# Patient Record
Sex: Female | Born: 1970
Health system: Southern US, Community
[De-identification: ages and names within clinical notes are randomized; demographics above are authoritative.]

## PROBLEM LIST (undated history)

## (undated) DIAGNOSIS — K219 Gastro-esophageal reflux disease without esophagitis: Secondary | ICD-10-CM

## (undated) DIAGNOSIS — E669 Obesity, unspecified: Secondary | ICD-10-CM

## (undated) DIAGNOSIS — D649 Anemia, unspecified: Secondary | ICD-10-CM

## (undated) DIAGNOSIS — I10 Essential (primary) hypertension: Secondary | ICD-10-CM

## (undated) DIAGNOSIS — M109 Gout, unspecified: Secondary | ICD-10-CM

## (undated) DIAGNOSIS — I209 Angina pectoris, unspecified: Secondary | ICD-10-CM

## (undated) DIAGNOSIS — D509 Iron deficiency anemia, unspecified: Secondary | ICD-10-CM

## (undated) DIAGNOSIS — Z9889 Other specified postprocedural states: Secondary | ICD-10-CM

## (undated) DIAGNOSIS — E041 Nontoxic single thyroid nodule: Secondary | ICD-10-CM

## (undated) DIAGNOSIS — I499 Cardiac arrhythmia, unspecified: Secondary | ICD-10-CM

## (undated) DIAGNOSIS — F419 Anxiety disorder, unspecified: Secondary | ICD-10-CM

## (undated) DIAGNOSIS — T884XXA Failed or difficult intubation, initial encounter: Secondary | ICD-10-CM

## (undated) DIAGNOSIS — G709 Myoneural disorder, unspecified: Secondary | ICD-10-CM

## (undated) DIAGNOSIS — K429 Umbilical hernia without obstruction or gangrene: Secondary | ICD-10-CM

## (undated) DIAGNOSIS — M545 Low back pain: Secondary | ICD-10-CM

## (undated) DIAGNOSIS — F1920 Other psychoactive substance dependence, uncomplicated: Secondary | ICD-10-CM

## (undated) DIAGNOSIS — Z8639 Personal history of other endocrine, nutritional and metabolic disease: Secondary | ICD-10-CM

## (undated) DIAGNOSIS — K2970 Gastritis, unspecified, without bleeding: Secondary | ICD-10-CM

## (undated) DIAGNOSIS — J45909 Unspecified asthma, uncomplicated: Secondary | ICD-10-CM

## (undated) DIAGNOSIS — E282 Polycystic ovarian syndrome: Secondary | ICD-10-CM

## (undated) DIAGNOSIS — M25569 Pain in unspecified knee: Secondary | ICD-10-CM

## (undated) DIAGNOSIS — Z8679 Personal history of other diseases of the circulatory system: Secondary | ICD-10-CM

## (undated) DIAGNOSIS — G4730 Sleep apnea, unspecified: Secondary | ICD-10-CM

## (undated) DIAGNOSIS — M1990 Unspecified osteoarthritis, unspecified site: Secondary | ICD-10-CM

## (undated) DIAGNOSIS — Z8719 Personal history of other diseases of the digestive system: Secondary | ICD-10-CM

## (undated) DIAGNOSIS — L309 Dermatitis, unspecified: Secondary | ICD-10-CM

## (undated) DIAGNOSIS — R51 Headache: Secondary | ICD-10-CM

## (undated) DIAGNOSIS — M25559 Pain in unspecified hip: Secondary | ICD-10-CM

## (undated) DIAGNOSIS — R519 Headache, unspecified: Secondary | ICD-10-CM

## (undated) DIAGNOSIS — M797 Fibromyalgia: Secondary | ICD-10-CM

## (undated) DIAGNOSIS — B009 Herpesviral infection, unspecified: Secondary | ICD-10-CM

## (undated) DIAGNOSIS — E119 Type 2 diabetes mellitus without complications: Secondary | ICD-10-CM

## (undated) DIAGNOSIS — M170 Bilateral primary osteoarthritis of knee: Secondary | ICD-10-CM

## (undated) DIAGNOSIS — F32A Depression, unspecified: Secondary | ICD-10-CM

## (undated) DIAGNOSIS — E039 Hypothyroidism, unspecified: Secondary | ICD-10-CM

## (undated) DIAGNOSIS — F329 Major depressive disorder, single episode, unspecified: Secondary | ICD-10-CM

## (undated) DIAGNOSIS — Z9660 Presence of unspecified orthopedic joint implant: Secondary | ICD-10-CM

## (undated) HISTORY — DX: Bilateral primary osteoarthritis of knee: M17.0

## (undated) HISTORY — PX: OTHER SURGICAL HISTORY: SHX169

## (undated) HISTORY — DX: Pain in unspecified knee: M25.569

## (undated) HISTORY — DX: Headache: R51

## (undated) HISTORY — PX: HIATAL HERNIA REPAIR: SHX195

## (undated) HISTORY — PX: SHOULDER ARTHROSCOPY: SHX128

## (undated) HISTORY — DX: Personal history of other endocrine, nutritional and metabolic disease: Z86.39

## (undated) HISTORY — PX: APPENDECTOMY: SHX54

## (undated) HISTORY — PX: LAPAROSCOPIC PARTIAL GASTRECTOMY: SHX1933

## (undated) HISTORY — PX: CHOLECYSTECTOMY: SHX55

## (undated) HISTORY — DX: Unspecified osteoarthritis, unspecified site: M19.90

## (undated) HISTORY — DX: Other specified postprocedural states: Z98.890

## (undated) HISTORY — PX: JOINT REPLACEMENT: SHX530

## (undated) HISTORY — DX: Gastritis, unspecified, without bleeding: K29.70

## (undated) HISTORY — DX: Pain in unspecified hip: M25.559

## (undated) HISTORY — DX: Low back pain: M54.5

## (undated) HISTORY — DX: Personal history of other diseases of the circulatory system: Z86.79

## (undated) HISTORY — PX: TRIGGER FINGER RELEASE: SHX641

## (undated) HISTORY — DX: Presence of unspecified orthopedic joint implant: Z96.60

## (undated) HISTORY — PX: DIAGNOSTIC LAPAROSCOPY: SUR761

## (undated) HISTORY — PX: CARPAL TUNNEL RELEASE: SHX101

---
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
## 2012-05-24 LAB — HM MAMMOGRAPHY

## 2012-12-05 DIAGNOSIS — E1159 Type 2 diabetes mellitus with other circulatory complications: Secondary | ICD-10-CM | POA: Insufficient documentation

## 2012-12-05 DIAGNOSIS — Z9889 Other specified postprocedural states: Secondary | ICD-10-CM | POA: Insufficient documentation

## 2012-12-05 DIAGNOSIS — E119 Type 2 diabetes mellitus without complications: Secondary | ICD-10-CM | POA: Insufficient documentation

## 2012-12-05 DIAGNOSIS — G4730 Sleep apnea, unspecified: Secondary | ICD-10-CM | POA: Insufficient documentation

## 2012-12-05 HISTORY — DX: Other specified postprocedural states: Z98.890

## 2013-03-12 ENCOUNTER — Ambulatory Visit: Payer: Self-pay | Admitting: Orthopedic Surgery

## 2013-03-12 DIAGNOSIS — I10 Essential (primary) hypertension: Secondary | ICD-10-CM

## 2013-03-12 LAB — URINALYSIS, COMPLETE
Bacteria: NONE SEEN
Blood: NEGATIVE
Hyaline Cast: 7
Ketone: NEGATIVE
Ph: 5 (ref 4.5–8.0)
Protein: 30
Specific Gravity: 1.028 (ref 1.003–1.030)

## 2013-03-12 LAB — CBC
HCT: 39.9 % (ref 35.0–47.0)
HGB: 13.1 g/dL (ref 12.0–16.0)
MCH: 29 pg (ref 26.0–34.0)
MCV: 88 fL (ref 80–100)
RBC: 4.51 10*6/uL (ref 3.80–5.20)
RDW: 13.4 % (ref 11.5–14.5)

## 2013-03-12 LAB — BASIC METABOLIC PANEL
Anion Gap: 7 (ref 7–16)
Calcium, Total: 8.7 mg/dL (ref 8.5–10.1)
Co2: 25 mmol/L (ref 21–32)
Osmolality: 277 (ref 275–301)
Potassium: 3.9 mmol/L (ref 3.5–5.1)
Sodium: 140 mmol/L (ref 136–145)

## 2013-03-12 LAB — APTT: Activated PTT: 28.3 secs (ref 23.6–35.9)

## 2013-03-12 LAB — MRSA PCR SCREENING

## 2013-03-12 LAB — SEDIMENTATION RATE: Erythrocyte Sed Rate: 15 mm/hr (ref 0–20)

## 2013-03-22 ENCOUNTER — Inpatient Hospital Stay: Payer: Self-pay | Admitting: Orthopedic Surgery

## 2013-03-23 LAB — BASIC METABOLIC PANEL
BUN: 6 mg/dL — ABNORMAL LOW (ref 7–18)
Calcium, Total: 8.1 mg/dL — ABNORMAL LOW (ref 8.5–10.1)
Creatinine: 0.85 mg/dL (ref 0.60–1.30)
EGFR (African American): >60
Glucose: 161 mg/dL — ABNORMAL HIGH (ref 65–99)
Potassium: 3.6 mmol/L (ref 3.5–5.1)

## 2013-03-23 LAB — HEMOGLOBIN: HGB: 10.9 g/dL — ABNORMAL LOW (ref 12.0–16.0)

## 2013-03-25 LAB — URINALYSIS, COMPLETE
Bilirubin,UR: NEGATIVE
Glucose,UR: NEGATIVE mg/dL (ref 0–75)
Ketone: NEGATIVE
Nitrite: NEGATIVE
Ph: 7 (ref 4.5–8.0)
Protein: NEGATIVE
Specific Gravity: 1.003 (ref 1.003–1.030)
Squamous Epithelial: <1
WBC UR: 11 /HPF (ref 0–5)

## 2013-03-26 LAB — PATHOLOGY REPORT

## 2013-03-28 LAB — URINE CULTURE

## 2013-04-10 ENCOUNTER — Inpatient Hospital Stay: Payer: Self-pay | Admitting: Orthopedic Surgery

## 2013-04-10 ENCOUNTER — Other Ambulatory Visit: Payer: Self-pay | Admitting: Nurse Practitioner

## 2013-04-10 LAB — CBC WITH DIFFERENTIAL/PLATELET
Basophil #: 0.1 10*3/uL (ref 0.0–0.1)
Eosinophil #: 0.3 10*3/uL (ref 0.0–0.7)
Eosinophil %: 2.1 %
HCT: 29.5 % — ABNORMAL LOW (ref 35.0–47.0)
HGB: 9.6 g/dL — ABNORMAL LOW (ref 12.0–16.0)
Lymphocyte #: 3.2 10*3/uL (ref 1.0–3.6)
Lymphocyte %: 25.9 %
MCH: 27.5 pg (ref 26.0–34.0)
MCV: 85 fL (ref 80–100)
Monocyte #: 0.7 x10 3/mm (ref 0.2–0.9)
Neutrophil #: 8.2 10*3/uL — ABNORMAL HIGH (ref 1.4–6.5)
Platelet: 636 10*3/uL — ABNORMAL HIGH (ref 150–440)
RBC: 3.49 10*6/uL — ABNORMAL LOW (ref 3.80–5.20)
RDW: 14.1 % (ref 11.5–14.5)

## 2013-04-10 LAB — COMPREHENSIVE METABOLIC PANEL
Albumin: 2.6 g/dL — ABNORMAL LOW (ref 3.4–5.0)
Alkaline Phosphatase: 112 U/L (ref 50–136)
Anion Gap: 10 (ref 7–16)
BUN: 8 mg/dL (ref 7–18)
Bilirubin,Total: 0.1 mg/dL — ABNORMAL LOW (ref 0.2–1.0)
Creatinine: 0.86 mg/dL (ref 0.60–1.30)
EGFR (African American): >60
EGFR (Non-African Amer.): >60
Glucose: 157 mg/dL — ABNORMAL HIGH (ref 65–99)
SGOT(AST): 17 U/L (ref 15–37)
SGPT (ALT): 20 U/L (ref 12–78)
Total Protein: 6.9 g/dL (ref 6.4–8.2)

## 2013-04-10 LAB — SEDIMENTATION RATE: Erythrocyte Sed Rate: 99 mm/hr — ABNORMAL HIGH (ref 0–20)

## 2013-04-12 LAB — CREATININE, SERUM
Creatinine: 0.83 mg/dL (ref 0.60–1.30)
EGFR (African American): >60
EGFR (Non-African Amer.): >60

## 2013-04-12 LAB — WBC: WBC: 11.8 10*3/uL — ABNORMAL HIGH (ref 3.6–11.0)

## 2013-05-08 ENCOUNTER — Ambulatory Visit: Payer: Self-pay

## 2013-10-09 DIAGNOSIS — E042 Nontoxic multinodular goiter: Secondary | ICD-10-CM | POA: Insufficient documentation

## 2013-12-13 ENCOUNTER — Emergency Department: Payer: Self-pay | Admitting: Emergency Medicine

## 2013-12-13 LAB — BASIC METABOLIC PANEL
Anion Gap: 7 (ref 7–16)
BUN: 8 mg/dL (ref 7–18)
CALCIUM: 8.7 mg/dL (ref 8.5–10.1)
CO2: 21 mmol/L (ref 21–32)
Chloride: 112 mmol/L — ABNORMAL HIGH (ref 98–107)
Creatinine: 0.81 mg/dL (ref 0.60–1.30)
EGFR (Non-African Amer.): >60
Glucose: 106 mg/dL — ABNORMAL HIGH (ref 65–99)
Osmolality: 278 (ref 275–301)
Potassium: 3.7 mmol/L (ref 3.5–5.1)
Sodium: 140 mmol/L (ref 136–145)

## 2013-12-13 LAB — CBC
HCT: 38.4 % (ref 35.0–47.0)
HGB: 12.1 g/dL (ref 12.0–16.0)
MCH: 25.9 pg — ABNORMAL LOW (ref 26.0–34.0)
MCHC: 31.5 g/dL — AB (ref 32.0–36.0)
MCV: 82 fL (ref 80–100)
Platelet: 364 10*3/uL (ref 150–440)
RBC: 4.67 10*6/uL (ref 3.80–5.20)
RDW: 16.9 % — ABNORMAL HIGH (ref 11.5–14.5)
WBC: 15 10*3/uL — ABNORMAL HIGH (ref 3.6–11.0)

## 2014-03-19 DIAGNOSIS — J45909 Unspecified asthma, uncomplicated: Secondary | ICD-10-CM | POA: Insufficient documentation

## 2014-04-04 ENCOUNTER — Ambulatory Visit: Payer: Self-pay | Admitting: Specialist

## 2014-04-25 ENCOUNTER — Ambulatory Visit: Payer: Self-pay | Admitting: Specialist

## 2014-04-25 DIAGNOSIS — E785 Hyperlipidemia, unspecified: Secondary | ICD-10-CM | POA: Insufficient documentation

## 2014-04-25 DIAGNOSIS — E1169 Type 2 diabetes mellitus with other specified complication: Secondary | ICD-10-CM | POA: Insufficient documentation

## 2014-04-30 DIAGNOSIS — E559 Vitamin D deficiency, unspecified: Secondary | ICD-10-CM | POA: Insufficient documentation

## 2014-06-17 ENCOUNTER — Ambulatory Visit: Payer: Self-pay | Admitting: Pain Medicine

## 2014-06-17 LAB — HEPATIC FUNCTION PANEL A (ARMC)
AST: 23 U/L (ref 15–37)
Albumin: 3.5 g/dL (ref 3.4–5.0)
Alkaline Phosphatase: 90 U/L
BILIRUBIN TOTAL: 0.2 mg/dL (ref 0.2–1.0)
SGPT (ALT): 22 U/L
TOTAL PROTEIN: 7.4 g/dL (ref 6.4–8.2)

## 2014-06-17 LAB — BASIC METABOLIC PANEL
ANION GAP: 6 — AB (ref 7–16)
BUN: 7 mg/dL (ref 7–18)
Calcium, Total: 8.5 mg/dL (ref 8.5–10.1)
Chloride: 111 mmol/L — ABNORMAL HIGH (ref 98–107)
Co2: 22 mmol/L (ref 21–32)
Creatinine: 0.98 mg/dL (ref 0.60–1.30)
Glucose: 99 mg/dL (ref 65–99)
OSMOLALITY: 276 (ref 275–301)
Potassium: 3.6 mmol/L (ref 3.5–5.1)
SODIUM: 139 mmol/L (ref 136–145)

## 2014-06-17 LAB — SEDIMENTATION RATE: Erythrocyte Sed Rate: 19 mm/hr (ref 0–20)

## 2014-06-17 LAB — MAGNESIUM: Magnesium: 1.6 mg/dL — ABNORMAL LOW

## 2014-07-08 ENCOUNTER — Ambulatory Visit: Payer: Self-pay | Admitting: Pain Medicine

## 2014-07-16 ENCOUNTER — Ambulatory Visit: Payer: Self-pay | Admitting: Pain Medicine

## 2014-07-25 DIAGNOSIS — R519 Headache, unspecified: Secondary | ICD-10-CM

## 2014-07-25 HISTORY — DX: Headache, unspecified: R51.9

## 2014-07-29 ENCOUNTER — Ambulatory Visit: Payer: Self-pay | Admitting: Pain Medicine

## 2014-09-24 DIAGNOSIS — F331 Major depressive disorder, recurrent, moderate: Secondary | ICD-10-CM | POA: Diagnosis not present

## 2014-09-24 DIAGNOSIS — F508 Other eating disorders: Secondary | ICD-10-CM | POA: Diagnosis not present

## 2014-09-24 DIAGNOSIS — F411 Generalized anxiety disorder: Secondary | ICD-10-CM | POA: Diagnosis not present

## 2014-09-26 DIAGNOSIS — I159 Secondary hypertension, unspecified: Secondary | ICD-10-CM | POA: Diagnosis not present

## 2014-09-26 DIAGNOSIS — E559 Vitamin D deficiency, unspecified: Secondary | ICD-10-CM | POA: Diagnosis not present

## 2014-09-26 DIAGNOSIS — E089 Diabetes mellitus due to underlying condition without complications: Secondary | ICD-10-CM | POA: Diagnosis not present

## 2014-09-26 DIAGNOSIS — E079 Disorder of thyroid, unspecified: Secondary | ICD-10-CM | POA: Diagnosis not present

## 2014-10-10 DIAGNOSIS — G8929 Other chronic pain: Secondary | ICD-10-CM | POA: Diagnosis not present

## 2014-10-10 DIAGNOSIS — F1120 Opioid dependence, uncomplicated: Secondary | ICD-10-CM | POA: Diagnosis not present

## 2014-10-10 DIAGNOSIS — M5387 Other specified dorsopathies, lumbosacral region: Secondary | ICD-10-CM | POA: Diagnosis not present

## 2014-10-10 DIAGNOSIS — M25569 Pain in unspecified knee: Secondary | ICD-10-CM | POA: Diagnosis not present

## 2014-10-10 DIAGNOSIS — M1990 Unspecified osteoarthritis, unspecified site: Secondary | ICD-10-CM | POA: Diagnosis not present

## 2014-10-10 DIAGNOSIS — M545 Low back pain: Secondary | ICD-10-CM | POA: Diagnosis not present

## 2014-10-10 DIAGNOSIS — Z79899 Other long term (current) drug therapy: Secondary | ICD-10-CM | POA: Diagnosis not present

## 2014-10-10 DIAGNOSIS — M488X6 Other specified spondylopathies, lumbar region: Secondary | ICD-10-CM | POA: Diagnosis not present

## 2014-10-15 ENCOUNTER — Ambulatory Visit: Payer: Self-pay | Admitting: Internal Medicine

## 2014-10-15 DIAGNOSIS — Z1231 Encounter for screening mammogram for malignant neoplasm of breast: Secondary | ICD-10-CM | POA: Diagnosis not present

## 2014-11-04 DIAGNOSIS — F508 Other eating disorders: Secondary | ICD-10-CM | POA: Diagnosis not present

## 2014-11-04 DIAGNOSIS — F331 Major depressive disorder, recurrent, moderate: Secondary | ICD-10-CM | POA: Diagnosis not present

## 2014-11-12 ENCOUNTER — Telehealth (HOSPITAL_COMMUNITY): Payer: Self-pay | Admitting: *Deleted

## 2014-11-12 NOTE — Telephone Encounter (Signed)
Error opening chart, was doing prior auth for medication but patient must have 2 different Medical record numbers.

## 2014-11-12 NOTE — Telephone Encounter (Signed)
Prior auth received for medication Vyvanse however patient has duplicate medical record numbers and no visits with [facility].

## 2014-11-14 DIAGNOSIS — F341 Dysthymic disorder: Secondary | ICD-10-CM | POA: Diagnosis not present

## 2014-11-19 DIAGNOSIS — G4733 Obstructive sleep apnea (adult) (pediatric): Secondary | ICD-10-CM | POA: Diagnosis not present

## 2014-11-19 DIAGNOSIS — J4530 Mild persistent asthma, uncomplicated: Secondary | ICD-10-CM | POA: Diagnosis not present

## 2014-11-19 DIAGNOSIS — R0609 Other forms of dyspnea: Secondary | ICD-10-CM | POA: Diagnosis not present

## 2014-11-22 DIAGNOSIS — R300 Dysuria: Secondary | ICD-10-CM | POA: Diagnosis not present

## 2014-11-26 DIAGNOSIS — F341 Dysthymic disorder: Secondary | ICD-10-CM | POA: Diagnosis not present

## 2014-11-27 DIAGNOSIS — M179 Osteoarthritis of knee, unspecified: Secondary | ICD-10-CM | POA: Diagnosis not present

## 2014-11-27 DIAGNOSIS — M25519 Pain in unspecified shoulder: Secondary | ICD-10-CM | POA: Diagnosis not present

## 2014-11-27 DIAGNOSIS — M25569 Pain in unspecified knee: Secondary | ICD-10-CM | POA: Diagnosis not present

## 2014-12-02 DIAGNOSIS — E079 Disorder of thyroid, unspecified: Secondary | ICD-10-CM | POA: Diagnosis not present

## 2014-12-02 DIAGNOSIS — E538 Deficiency of other specified B group vitamins: Secondary | ICD-10-CM | POA: Diagnosis not present

## 2014-12-02 DIAGNOSIS — E089 Diabetes mellitus due to underlying condition without complications: Secondary | ICD-10-CM | POA: Diagnosis not present

## 2014-12-02 DIAGNOSIS — F508 Other eating disorders: Secondary | ICD-10-CM | POA: Diagnosis not present

## 2014-12-02 DIAGNOSIS — Z79899 Other long term (current) drug therapy: Secondary | ICD-10-CM | POA: Diagnosis not present

## 2014-12-02 DIAGNOSIS — F331 Major depressive disorder, recurrent, moderate: Secondary | ICD-10-CM | POA: Diagnosis not present

## 2014-12-02 DIAGNOSIS — E782 Mixed hyperlipidemia: Secondary | ICD-10-CM | POA: Diagnosis not present

## 2014-12-02 DIAGNOSIS — I159 Secondary hypertension, unspecified: Secondary | ICD-10-CM | POA: Diagnosis not present

## 2014-12-16 DIAGNOSIS — E1142 Type 2 diabetes mellitus with diabetic polyneuropathy: Secondary | ICD-10-CM | POA: Diagnosis not present

## 2014-12-16 DIAGNOSIS — E079 Disorder of thyroid, unspecified: Secondary | ICD-10-CM | POA: Diagnosis not present

## 2014-12-16 DIAGNOSIS — I10 Essential (primary) hypertension: Secondary | ICD-10-CM | POA: Diagnosis not present

## 2014-12-16 DIAGNOSIS — E282 Polycystic ovarian syndrome: Secondary | ICD-10-CM | POA: Diagnosis not present

## 2014-12-17 DIAGNOSIS — F1120 Opioid dependence, uncomplicated: Secondary | ICD-10-CM | POA: Diagnosis not present

## 2014-12-17 DIAGNOSIS — F341 Dysthymic disorder: Secondary | ICD-10-CM | POA: Diagnosis not present

## 2014-12-18 DIAGNOSIS — F1120 Opioid dependence, uncomplicated: Secondary | ICD-10-CM | POA: Diagnosis not present

## 2014-12-18 DIAGNOSIS — M545 Low back pain: Secondary | ICD-10-CM | POA: Diagnosis not present

## 2014-12-18 DIAGNOSIS — M25569 Pain in unspecified knee: Secondary | ICD-10-CM | POA: Diagnosis not present

## 2014-12-18 DIAGNOSIS — M25521 Pain in right elbow: Secondary | ICD-10-CM | POA: Diagnosis not present

## 2014-12-18 DIAGNOSIS — G8929 Other chronic pain: Secondary | ICD-10-CM | POA: Diagnosis not present

## 2014-12-18 DIAGNOSIS — M47896 Other spondylosis, lumbar region: Secondary | ICD-10-CM | POA: Diagnosis not present

## 2014-12-27 DIAGNOSIS — B379 Candidiasis, unspecified: Secondary | ICD-10-CM | POA: Diagnosis not present

## 2014-12-30 ENCOUNTER — Other Ambulatory Visit: Payer: Self-pay

## 2014-12-30 ENCOUNTER — Ambulatory Visit: Admit: 2014-12-30 | Disposition: A | Discharge status: Home / Self Care | Payer: Self-pay

## 2014-12-30 DIAGNOSIS — M25521 Pain in right elbow: Secondary | ICD-10-CM | POA: Diagnosis not present

## 2014-12-30 DIAGNOSIS — M19021 Primary osteoarthritis, right elbow: Secondary | ICD-10-CM | POA: Diagnosis not present

## 2014-12-30 DIAGNOSIS — M19019 Primary osteoarthritis, unspecified shoulder: Secondary | ICD-10-CM | POA: Diagnosis not present

## 2014-12-30 DIAGNOSIS — M25519 Pain in unspecified shoulder: Secondary | ICD-10-CM | POA: Diagnosis not present

## 2014-12-30 DIAGNOSIS — Z6841 Body Mass Index (BMI) 40.0 and over, adult: Secondary | ICD-10-CM | POA: Diagnosis not present

## 2014-12-30 DIAGNOSIS — G8929 Other chronic pain: Secondary | ICD-10-CM | POA: Diagnosis not present

## 2014-12-30 NOTE — Patient Outreach (Signed)
[facility]) Care Management  12/30/2014  Karen Lewis 03-13-1971 [id number]   RN CM spoke with patient to discuss the services of [facility].  Patient reports she has type II diabetes.  States her blood sugar have been high because of the steroids.  States she has takes steroids for her asthma.  States she goes to the pain clinic in [place] and some of the medication for pain affected her blood sugars.  Patient states she takes Metformin 1000 mg twice a day.  States her blood sugars are under control.  Patient reports her primary doctor wrote a prescription for a glucose meter and strips.  Patient states she has a Engineer, maintenance to help with goal setting and nutrition.  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  Patient declined the services of [facility].  RN CM will close this case and notify primary physician.  Maury Dus, RN, Ishmael Holter, [place] Telephonic Care Coordinator [phone]

## 2014-12-31 NOTE — Patient Outreach (Signed)
[facility]) Care Management  12/31/2014  Karen Lewis 1970-12-10 [id number]   Received notification from Maury Dus, RN to close case due to patient refused to participate with [place] Management at this time.  Case closed at this time.  [name]. [place] CM Assistant Phone: [phone] Fax: [phone]

## 2015-01-02 DIAGNOSIS — F508 Other eating disorders: Secondary | ICD-10-CM | POA: Diagnosis not present

## 2015-01-02 DIAGNOSIS — F331 Major depressive disorder, recurrent, moderate: Secondary | ICD-10-CM | POA: Diagnosis not present

## 2015-01-03 NOTE — Op Note (Signed)
PATIENT NAME:  Karen Lewis, Karen Lewis MR#:  [id number] DATE OF BIRTH:  03-25-1971  DATE OF PROCEDURE:  05/08/2013  PREOPERATIVE DIAGNOSIS:  Infected left hip arthroplasty.   POSTOPERATIVE DIAGNOSIS: Infected left hip arthroplasty.   PROCEDURES:  1. Fluoroscopic guidance for placement of catheter.  2. Insertion of peripherally inserted central venous catheter, single lumen, Morpheus PICC line at left arm.  SURGEON: Hortencia Pilar, M.D.   ANESTHESIA: Local.   ESTIMATED BLOOD LOSS: Minimal.   INDICATION FOR PROCEDURE: Requiring IV antibiotics greater than five days.   DESCRIPTION OF PROCEDURE: The patient's left arm was sterilely prepped and draped, and a sterile surgical field was created. The existing catheter was cut in a sterile fashion and the 0.018 wire was then placed into the superior vena cava. Peel-away sheath was placed over the wire. A single lumen peripherally inserted central venous catheter was then placed over the wire and the wire and peel-away sheath were removed. The catheter tip was placed into the superior vena cava and was secured to the skin. A sterile dressing was applied. The catheter withdrew blood well and flushed easily with heparinized saline. The patient tolerated procedure well.   ____________________________ Katha Cabal, MD ggs:np D: 05/08/2013 19:17:05 ET T: 05/08/2013 20:02:38 ET JOB#: [id number]  cc: Katha Cabal, MD, <Dictator> Katha Cabal MD ELECTRONICALLY SIGNED 05/25/2013 9:11

## 2015-01-03 NOTE — Op Note (Signed)
PATIENT NAME:  Karen Lewis, Karen Lewis MR#:  [id number] DATE OF BIRTH:  01-17-1971  DATE OF PROCEDURE:  03/22/2013  PREOPERATIVE DIAGNOSIS: Severe left hip osteoarthritis.   POSTOPERATIVE DIAGNOSIS: Severe left hip osteoarthritis.   PROCEDURE: Left total hip replacement.   SURGEON: Laurene Footman, MD  ASSISTANT: April M. Tretha Sciara, NP  ANESTHESIA: Spinal.   DESCRIPTION OF PROCEDURE: The patient was brought to the operating room, and after adequate spinal anesthesia was obtained, the hip was prepped and draped in the usual sterile fashion. After initial prepping and draping was completed and C-arm views of the hip were obtained, appropriate patient identification and timeout procedures completed, with the hip then approached with a direct anterior approach centered over the greater trochanter, incision down through the skin and subcutaneous tissue. The case was made difficult secondary to the patient's morbid obesity. After the tensor fascia was incised and the tensor retracted laterally, the rectus was retracted medially, and very large lateral circumflex vessels were ligated. The capsule was then exposed, and the large flap made for placement of the modified Charnley retractor. Femoral neck cut was carried out and the femoral head removed. It had extensive degenerative changes present. The labrum excised along with the ligamentum teres and sequential reaming of the cup performed under fluoroscopic guidance, with a 52 mm trial fitting well and the 52 mm cup being impacted and appearing stable. Next, attention was turned to the femur. With the leg externally rotated, the pubofemoral and ischiofemoral ligaments were released, the leg dropped down into extension, external rotation and adduction. Sequential broaching was carried out, and trials were placed with a size 2 broach, and this appeared to give a tight fit with good stability and leg length. The 2 standard stem was then impacted down the canal with the  appropriate DM liner and S-sized head assembled. The head was impacted onto the stem, the hip reduced and was very stable. Leg lengths appeared equal to the preop, and the wound was then thoroughly irrigated. The tensor fascia repaired using a heavy Quill, 2-0 Quill subcutaneously and skin staples, with a wound VAC then applied.   IMPLANTS: Medacta Versafit cup DM size 52, size S 28 mm femoral head, size 2 standard AMIS stem with a DM liner.   ESTIMATED BLOOD LOSS: 1250.   COMPLICATIONS: None.   SPECIMEN: Removed femoral head.   ____________________________ Laurene Footman, MD mjm:OSi D: 03/22/2013 21:34:03 ET T: 03/23/2013 06:31:16 ET JOB#: [id number]  cc: Laurene Footman, MD, <Dictator> Laurene Footman MD ELECTRONICALLY SIGNED 03/23/2013 6:56

## 2015-01-03 NOTE — Discharge Summary (Signed)
PATIENT NAME:  Karen Lewis, Karen Lewis MR#:  [id number] DATE OF BIRTH:  [birth year]  DATE OF ADMISSION:  03/22/2013 DATE OF DISCHARGE:    ADMITTING DIAGNOSIS: Left hip osteoarthritis.   DISCHARGE DIAGNOSIS: Left hip osteoarthritis.  OPERATION: On 03/22/2013, she had a left total hip arthroplasty, anterior approach.    SURGEON: Hessie Knows, MD.   ANESTHESIA: Spinal.   ESTIMATED BLOOD LOSS: 1250 mL.   DRAINS: Wound VAC.   IMPLANTS: Medacta 2 standard AMIS stem, 52 mm Versa fit cup and DM liner, S 28 head.   COMPLICATIONS: None.   HISTORY: Karen Lewis is a 44 year old, obese female with progressive left hip osteoarthritis. She failed conservative measures and wished to proceed with a left total hip arthroplasty.   PHYSICAL EXAMINATION:  GENERAL: Well-developed, well-nourished, morbidly obese. Alert and oriented x 3. No acute distress. Marland Kitchen  NEUROLOGIC: Sensation intact left lower extremity.  HEENT: Normocephalic, atraumatic and PERRLA. Mallampati score 1. No dentures or partials.  CARDIOVASCULAR: No edema or effusion.   HEART: Regular rate and rhythm. No murmurs.  RESPIRATORY: Lung sounds clear to auscultation. No wheezes, rhonchi or crackles. Chest expansion symmetric bilaterally.  ABDOMEN: No hepatomegaly. No splenomegaly. Abdomen soft. Nontender throughout entire abdomen. Positive bowel sounds in all 4 quadrants.  EXTREMITIES: Mildly antalgic gait favoring left hip. No gross deformities left hip. She has a 3 over 5 muscle strength with hip flexion. Has significant pain with internal external rotation of the left hip and nearly no internal rotation of the left hip. She is nontender to palpation throughout the left hip.  SKIN: No scars, rashes or lesions.   HOSPITAL COURSE: After initial admission on 03/22/2013, she had surgery the same day. She had a good pain control afterwards and was brought to the orthopedic floor from the PACU. Her initial hemoglobin on postoperative day 1, 03/23/2013 was 10.9.  Platelets 275. Physical therapy was begun on that day. On postoperative day 2, 03/24/2013, she continued to have slow progress with physical therapy. She had significant pain with pain control after surgery. PT was ordered, but it was discontinued on postoperative day 2. She complained of muscle spasms in her lateral thigh and increased her tizanidine. She continued to have slow progress with physical therapy. On postoperative day 3, 03/25/2013, she continued to progress with physical therapy. Oxy-Contin was helping for her pain relief. She expressed concern about wanting to be discharged home and worked hard with physical therapy. On postoperative day 4, 03/26/2013, she had ambulated 200 feet. She preferred to go home instead of rehab. She has met physical therapy goals and will be able to do so. On postoperative day 4, 03/26/2013, she also began having some erythema over the anterior aspect of the left knee and over the lateral aspect of the proximal thigh. Triamcinolone cream was ordered and will continue to monitor.    CONDITION AT DISCHARGE: Stable.   DISPOSITION: The patient was sent home.   DISCHARGE INSTRUCTIONS: The patient will follow up with [facility] orthopedics in 2 weeks for staple removal. She will do physical therapy and weight bear as tolerated on the left lower extremity using a walker. Her diet is regular. Her wound VAC will be changed to a Provena before discharge. After approximately 7 days,  she may switch to a dry dressing.   DISCHARGE MEDICATIONS: Please see discharge instructions for discharge medications with the addition of triamcinolone acetonide 0.5% cream apply to affected area q.4 hours p.r.n. itching.   ____________________________ Melanie Pellot M. Tretha Sciara, NP amb:aw D:  03/26/2013 08:28:08 ET T: 03/26/2013 09:50:05 ET JOB#: [id number]  cc: Lavonte Palos M. Tretha Sciara, NP, <Dictator> Kem Kays Sister Carbone FNP ELECTRONICALLY SIGNED 04/05/2013 11:41

## 2015-01-03 NOTE — Discharge Summary (Signed)
PATIENT NAME:  Karen Lewis, MATTIOLI MR#:  [id number] DATE OF BIRTH:  1971-05-08  DATE OF ADMISSION:  04/10/2013 DATE OF DISCHARGE:  04/13/2013  ADMITTING DIAGNOSIS: Status post left total hip arthroplasty with infected wound.   DISCHARGE DIAGNOSIS: Status post left total hip arthroplasty with infected wound.   OPERATION: No operation was performed.   HISTORY AND PHYSICAL AND HISTORY:  The patient is a 44 year old female who presented for evaluation to the Clinic for draining wound and status post left total hip arthroplasty done by Dr. Rudene Christians 03/22/2013. The patient had a lot of bleeding and drainage from her wound. The patient has been having some increased pain as well. The patient had cultures taken before admission by April Berndt and then sent over for admission for possible surgical intervention by Dr. Rudene Christians.  PHYSICAL EXAMINATION:  GENERAL: Obese female in a wheelchair with no gait noted. She has no ambulation.  HEART: Regular rate and rhythm.  LUNGS: Clear to auscultation.  MUSCULOSKELETAL: In regard to the left lower extremity, the patient has anterior incision with about a 3 cm gapping wide and a 6 cm gapping of the incision in length with drainage with purulence and serosanguineous drainage. The patient has no erythematous tracking up the abdomen or down the leg. The patient has limited range of motion with testing.   HOSPITAL COURSE: After initial admission on 04/11/2013, the patient that day of admission had a wound VAC applied superficially by nursing staff. On 04/12/2013, Dr. Rudene Christians replaced the wound VAC and inserted the gauze deep into the wound with increased purulence extracted and better control of the wound infection. The patient was placed on vancomycin IV initially on admission and then was seen by Dr. Ola Spurr on 04/12/2013 for infectious disease consultation. The patient was continued on IV vancomycin for approximately 6 weeks. The patient is to go home with IV antibiotics and wound  care nurse to change her wound VAC. The patient did have a PICC line inserted that same day and would be able to go home with PICC line care with home health nursing. Then the patient on 04/13/2013 had another wound VAC change by Dr. Rudene Christians with insertion of the gauze.  The patient was then ready to go home on 08/01.   CONDITION AT DISCHARGE: Stable.   DISPOSITION: The patient was sent home with home health nursing and wound VAC care.   DISCHARGE INSTRUCTIONS:  The patient will follow up with [facility] orthopedics on Monday, August 4th for another wound VAC change by Dr. Rudene Christians. The patient is weight-bear as tolerated with a walker. The patient will do a regular diet. The patient will keep her wound VAC in place and not remove it except for the wound care nurse after the change on Monday. The patient will call the clinic if there is any increased bleeding or fever greater than 101.5 or any calf pain, bowel or bladder difficulty.   DISCHARGE MEDICATIONS: Are to resume home medication and then stay on oxycodone 5 mg one 4 to 6 hours for pain and just add vancomycin 1500 mg every 12 hours by PICC line. ____________________________ J. Reche Dixon, [place] jtm:sb D: 04/13/2013 08:05:34 ET T: 04/13/2013 08:13:35 ET JOB#: [id number]  cc: J. Reche Dixon, [place], <Dictator> J Leray Garverick [facility] PA ELECTRONICALLY SIGNED 05/07/2013 10:35

## 2015-01-03 NOTE — Consult Note (Signed)
PATIENT NAME:  Karen Lewis, Karen Lewis MR#:  [id number] DATE OF BIRTH:  Jun 01, 1971  INFECTIOUS DISEASES CONSULTATION REPORT  DATE OF CONSULTATION:  04/12/2013  REQUESTING PHYSICIAN:  Dr. Rudene Christians. CONSULTING PHYSICIAN:  Cheral Marker. Ola Spurr, MD  REASON FOR CONSULTATION: MRSA postoperative infection on the left hip.   HISTORY OF PRESENT ILLNESS: This is a pleasant 44 year old female with a history of morbid obesity, diabetes, hypertension, hypothyroidism and osteoarthritis. She underwent elective total hip replacement on July 10th. The patient reports that she was hospitalized for  5 days postop. She states she did have some swelling and redness at the site but otherwise did well. Of note, she was MRSA-positive on nasal swab prior. Her hospital course during that initial admission was complicated by a urinary tract infection, post-Foley catheter removal. She thinks she was treated with ciprofloxacin for that. After discharge the patient reports that the incision did not have any drainage, but was still somewhat swollen and red. She did well for a few days, but then started to have drainage from this site. She was followed in clinic by Ortho and placed on Keflex. However, the wound worsened and started having more purulent drainage. She was seen in the clinic on July 30th and admitted directly for further evaluation. The wound was opened at bedside. Cultures are growing MRSA. A Wound-VAC is in place.   The patient states that just prior to coming in she felt fatigued and febrile, and felt quite horrible. Since being admitted yesterday and getting several doses of vancomycin, her systemic symptoms have improved. We are asked to assess for further antibiotic management.   PAST MEDICAL HISTORY: 1.  Diabetes.  2.  Hypertension.  3.  Asthma.  4.  Hypothyroidism.  5.  Polycystic ovarian syndrome.  6.  Depression.  7.  Arthritis.  8.  Migraines.  9.  Hyperlipidemia.   PAST SURGICAL HISTORY: 1.  Hip surgery, as  above.  2.  Shoulder arthroscopy, 2001.  3.  Gallbladder resection, 2005.  4.  Right trigger thumb surgery, 2012.  5.  Carpal tunnel syndrome, 2011 and 2012.  6.  Vertical gastric band weight loss surgery, April 2013.   SOCIAL HISTORY: The patient is single. She moved down here from [place]. She does not smoke or drink. 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   FAMILY HISTORY: Positive for hypertension, diabetes, and obesity and mental illness in her father and her mother. Her brother also is morbidly obese, and has hypertension and diabetes.   ALLERGIES: THE PATIENT IS ALLERGIC TO BACTRIM, WHICH CAUSES HIVES, AND CLINDAMYCIN, WHICH CAUSES A RASH. ASPIRIN AND IBUPROFEN BOTH CAUSE AN UPSET STOMACH.   REVIEW OF SYSTEMS: 11 systems reviewed and negative except as per HPI.   MEDICATIONS: Current antibiotics include vancomycin, with a dose of 1500 mg every  12 hours. Of note, she got vancomycin at the time of surgery, per the patient.   Other meds include amitriptyline, Colace, Cymbalta, fluconazole, Flonase, gabapentin, levothyroxine, metoprolol, Singulair, multivitamin, omeprazole, oxycodone, pravastatin, Senna, Zoloft, spironolactone, Topamax. She is also on insulin, sliding-scale.   PHYSICAL EXAMINATION: GENERAL: Patient is morbidly obese. She is sitting comfortably in a chair. She is interactive and alert.  VITAL SIGNS: T-max 98.9, pulse 78, blood pressure 128/73, respirations 20, sat 97% on room air.  HEENT: Pupils equal, round, and accommodation. Extraocular movements are intact. Sclerae anicteric. Oropharynx clear.  NECK: Supple.  HEART: Regular.  LUNGS: Clear.  ABDOMEN: Massively obese, with a  large pannus that overhangs her incision site. Nontender.  EXTREMITIES: There is no clubbing, cyanosis or edema.   SKIN: On her left hip site she has a Wound-VAC covering her recent surgical incision site. The ends of the site are relatively well-healed,  and the center is where the [facility] is, and there seems to be an opening. She shows pictures of the bedside debridement and opening of the wound. There is some purulent drainage in the [facility].  NEURO: She is alert and oriented x 3. Grossly nonfocal neuro exam.   DATA: Culture done from the wound, July 29th, is growing moderate MRSA. No other organisms were identified. It is sensitive to gentamicin, vancomycin, linezolid, tigecycline,  Bactrim. Resistant to levofloxacin, erythromycin, ciprofloxacin, oxacillin, and clindamycin.   ESR done on admission is 99. CRP is elevated at 28.8. White blood count on admission is 12.5, with 8.2 thousand neutrophils. Hemoglobin 9.6, platelets quite elevated at 636.   LFTs are normal except for a low albumin of 2.6.   Renal function was normal with a creatinine of 0.86.   Vancomycin trough done July 30th was 17, and July 31st was 10.   Chest x-ray is normal. The patient has a PICC line in her left upper extremity.   IMPRESSION:  1.  A 44 year old now approximately 21 days' postoperative with a left hip surgical site infection with MRSA. There is some concern about a deeper infection, including possible involvement of the prosthesis. Her ESR is quite elevated. Currently she is improved in her systemic and the swelling at the site after just 1-1/2 days of vancomycin. At this point, I would recommend IV vancomycin with a goal trough level of 15 to 20. I will discuss further with Dr. Rudene Christians whether he feels a surgical I and D of the wound is necessary, although at this point it does seem to be slightly improved per the patient. She has had no fevers since admission.  2.  The duration of antibiotics will depend on whether or not she has a deeper infection. She will need at least 2 weeks of IV vancomycin if it is just a superficial infection. If it is deeper and involves the prosthesis, of course she would need a 6-week course.   She mainly need prolonged oral  antibiotic suppression after that if there is a concern that the prosthesis was infected. SHE IS ALLERGIC TO BACTRIM AND CLINDAMYCIN, which does complicate the choice of antibiotics; however she should be able to take doxycycline which is well-absorbed as on which she has done well.    Thank you for the consult. I would be glad to follow with you. I have written for antibiotics and necessary labs and left a prescription on the chart for case management.    ____________________________ Cheral Marker. Ola Spurr, MD dpf:dm D: 04/12/2013 13:23:35 ET T: 04/12/2013 14:03:39 ET JOB#: [id number]  cc: Cheral Marker. Ola Spurr, MD, <Dictator> Annye Forrey Ola Spurr MD ELECTRONICALLY SIGNED 04/12/2013 19:56

## 2015-01-13 DIAGNOSIS — G8929 Other chronic pain: Secondary | ICD-10-CM | POA: Diagnosis not present

## 2015-01-13 DIAGNOSIS — Z6841 Body Mass Index (BMI) 40.0 and over, adult: Secondary | ICD-10-CM | POA: Diagnosis not present

## 2015-01-13 DIAGNOSIS — M25569 Pain in unspecified knee: Secondary | ICD-10-CM | POA: Diagnosis not present

## 2015-01-13 DIAGNOSIS — M1990 Unspecified osteoarthritis, unspecified site: Secondary | ICD-10-CM | POA: Diagnosis not present

## 2015-01-14 DIAGNOSIS — R300 Dysuria: Secondary | ICD-10-CM | POA: Diagnosis not present

## 2015-01-14 DIAGNOSIS — N3090 Cystitis, unspecified without hematuria: Secondary | ICD-10-CM | POA: Diagnosis not present

## 2015-01-21 DIAGNOSIS — F341 Dysthymic disorder: Secondary | ICD-10-CM | POA: Diagnosis not present

## 2015-01-28 DIAGNOSIS — F1190 Opioid use, unspecified, uncomplicated: Secondary | ICD-10-CM | POA: Diagnosis not present

## 2015-01-28 DIAGNOSIS — F508 Other eating disorders: Secondary | ICD-10-CM | POA: Diagnosis not present

## 2015-01-28 DIAGNOSIS — N390 Urinary tract infection, site not specified: Secondary | ICD-10-CM | POA: Diagnosis not present

## 2015-01-28 DIAGNOSIS — Z6841 Body Mass Index (BMI) 40.0 and over, adult: Secondary | ICD-10-CM | POA: Diagnosis not present

## 2015-01-28 DIAGNOSIS — M25561 Pain in right knee: Secondary | ICD-10-CM | POA: Diagnosis not present

## 2015-01-28 DIAGNOSIS — F411 Generalized anxiety disorder: Secondary | ICD-10-CM | POA: Diagnosis not present

## 2015-01-28 DIAGNOSIS — F331 Major depressive disorder, recurrent, moderate: Secondary | ICD-10-CM | POA: Diagnosis not present

## 2015-01-28 DIAGNOSIS — G894 Chronic pain syndrome: Secondary | ICD-10-CM | POA: Diagnosis not present

## 2015-01-28 DIAGNOSIS — M179 Osteoarthritis of knee, unspecified: Secondary | ICD-10-CM | POA: Diagnosis not present

## 2015-01-29 DIAGNOSIS — N390 Urinary tract infection, site not specified: Secondary | ICD-10-CM | POA: Diagnosis not present

## 2015-03-05 DIAGNOSIS — F341 Dysthymic disorder: Secondary | ICD-10-CM | POA: Diagnosis not present

## 2015-03-10 ENCOUNTER — Other Ambulatory Visit: Payer: Self-pay

## 2015-03-10 ENCOUNTER — Telehealth: Payer: Self-pay | Admitting: Psychiatry

## 2015-03-10 DIAGNOSIS — Z8679 Personal history of other diseases of the circulatory system: Secondary | ICD-10-CM

## 2015-03-10 DIAGNOSIS — F50819 Binge eating disorder, unspecified: Secondary | ICD-10-CM | POA: Insufficient documentation

## 2015-03-10 DIAGNOSIS — G4700 Insomnia, unspecified: Secondary | ICD-10-CM | POA: Insufficient documentation

## 2015-03-10 DIAGNOSIS — F411 Generalized anxiety disorder: Secondary | ICD-10-CM | POA: Insufficient documentation

## 2015-03-10 DIAGNOSIS — Z8709 Personal history of other diseases of the respiratory system: Secondary | ICD-10-CM | POA: Insufficient documentation

## 2015-03-10 DIAGNOSIS — F5081 Binge eating disorder: Secondary | ICD-10-CM | POA: Insufficient documentation

## 2015-03-10 DIAGNOSIS — Z8639 Personal history of other endocrine, nutritional and metabolic disease: Secondary | ICD-10-CM

## 2015-03-10 DIAGNOSIS — M797 Fibromyalgia: Secondary | ICD-10-CM | POA: Insufficient documentation

## 2015-03-10 DIAGNOSIS — F331 Major depressive disorder, recurrent, moderate: Secondary | ICD-10-CM | POA: Insufficient documentation

## 2015-03-10 HISTORY — DX: Personal history of other diseases of the circulatory system: Z86.79

## 2015-03-10 HISTORY — DX: Personal history of other endocrine, nutritional and metabolic disease: Z86.39

## 2015-03-10 NOTE — Telephone Encounter (Signed)
pt states she needs refill on sertraline hcl 100mg 

## 2015-03-11 MED ORDER — SERTRALINE HCL 100 MG PO TABS: 200.0000 mg | ORAL_TABLET | Freq: Every day | ORAL | Status: DC

## 2015-03-11 MED ORDER — LISDEXAMFETAMINE DIMESYLATE 60 MG PO CAPS: 60.0000 mg | ORAL_CAPSULE | ORAL | Status: DC

## 2015-03-12 NOTE — Telephone Encounter (Signed)
left message that rx was done.

## 2015-03-18 DIAGNOSIS — F1190 Opioid use, unspecified, uncomplicated: Secondary | ICD-10-CM | POA: Diagnosis not present

## 2015-03-18 DIAGNOSIS — G894 Chronic pain syndrome: Secondary | ICD-10-CM | POA: Diagnosis not present

## 2015-03-18 DIAGNOSIS — M25569 Pain in unspecified knee: Secondary | ICD-10-CM | POA: Diagnosis not present

## 2015-03-18 DIAGNOSIS — G8929 Other chronic pain: Secondary | ICD-10-CM | POA: Diagnosis not present

## 2015-03-18 DIAGNOSIS — Z6841 Body Mass Index (BMI) 40.0 and over, adult: Secondary | ICD-10-CM | POA: Diagnosis not present

## 2015-03-18 DIAGNOSIS — M179 Osteoarthritis of knee, unspecified: Secondary | ICD-10-CM | POA: Diagnosis not present

## 2015-03-18 DIAGNOSIS — M797 Fibromyalgia: Secondary | ICD-10-CM | POA: Diagnosis not present

## 2015-03-20 ENCOUNTER — Other Ambulatory Visit: Payer: Self-pay

## 2015-03-20 NOTE — Telephone Encounter (Signed)
pt lost rx for vyvanse 60mg  needs a new rx

## 2015-03-24 NOTE — Telephone Encounter (Signed)
spoke with pharmacy, she states that the last thing showing in there system was a rx from 01-28-15 that was filled [place] [facility] on the 02-02-15 last time filedl

## 2015-03-25 DIAGNOSIS — J0100 Acute maxillary sinusitis, unspecified: Secondary | ICD-10-CM | POA: Diagnosis not present

## 2015-03-25 DIAGNOSIS — J45901 Unspecified asthma with (acute) exacerbation: Secondary | ICD-10-CM | POA: Diagnosis not present

## 2015-03-25 NOTE — Telephone Encounter (Signed)
pt stopped dr. Jimmye Norman on the way out of the office. so he came back into the office and hand wrote a rx. a copy of rx was made so we can hopefully have a copy scaned into the system. pt was given the rx.

## 2015-03-26 MED ORDER — LISDEXAMFETAMINE DIMESYLATE 60 MG PO CAPS: 60.0000 mg | ORAL_CAPSULE | ORAL | Status: DC

## 2015-03-26 NOTE — Telephone Encounter (Signed)
Patient had contacted this clinic indicating that she lost her prescription for Vyvanse. Staff did contact the pharmacy to verify that the patient had last filled her prescription on 02/02/2015. Patient was provided with a hard copy prescription yesterday. Vyvanse 60 mg, #30 with no refills.

## 2015-03-31 ENCOUNTER — Ambulatory Visit: Payer: Commercial Managed Care - HMO | Admitting: Psychiatry

## 2015-04-07 DIAGNOSIS — Z79899 Other long term (current) drug therapy: Secondary | ICD-10-CM | POA: Diagnosis not present

## 2015-04-07 DIAGNOSIS — E118 Type 2 diabetes mellitus with unspecified complications: Secondary | ICD-10-CM | POA: Diagnosis not present

## 2015-04-07 DIAGNOSIS — E1142 Type 2 diabetes mellitus with diabetic polyneuropathy: Secondary | ICD-10-CM | POA: Diagnosis not present

## 2015-04-07 DIAGNOSIS — R300 Dysuria: Secondary | ICD-10-CM | POA: Diagnosis not present

## 2015-04-10 DIAGNOSIS — R8290 Unspecified abnormal findings in urine: Secondary | ICD-10-CM | POA: Diagnosis not present

## 2015-04-14 DIAGNOSIS — N390 Urinary tract infection, site not specified: Secondary | ICD-10-CM | POA: Diagnosis not present

## 2015-04-16 ENCOUNTER — Ambulatory Visit: Payer: Self-pay | Admitting: Psychiatry

## 2015-04-17 DIAGNOSIS — F341 Dysthymic disorder: Secondary | ICD-10-CM | POA: Diagnosis not present

## 2015-04-30 DIAGNOSIS — R51 Headache: Secondary | ICD-10-CM | POA: Diagnosis not present

## 2015-04-30 DIAGNOSIS — F329 Major depressive disorder, single episode, unspecified: Secondary | ICD-10-CM | POA: Diagnosis not present

## 2015-04-30 DIAGNOSIS — G4733 Obstructive sleep apnea (adult) (pediatric): Secondary | ICD-10-CM | POA: Diagnosis not present

## 2015-04-30 DIAGNOSIS — M797 Fibromyalgia: Secondary | ICD-10-CM | POA: Diagnosis not present

## 2015-05-05 ENCOUNTER — Other Ambulatory Visit: Payer: Self-pay | Admitting: Psychiatry

## 2015-05-06 ENCOUNTER — Other Ambulatory Visit: Payer: Self-pay | Admitting: Neurology

## 2015-05-06 DIAGNOSIS — I729 Aneurysm of unspecified site: Secondary | ICD-10-CM

## 2015-05-07 ENCOUNTER — Ambulatory Visit
Admission: RE | Admit: 2015-05-07 | Discharge: 2015-05-07 | Disposition: A | Discharge status: Home / Self Care | Payer: Commercial Managed Care - HMO | Source: Ambulatory Visit | Attending: Neurology | Admitting: Neurology

## 2015-05-07 ENCOUNTER — Encounter: Payer: Self-pay | Admitting: *Deleted

## 2015-05-07 ENCOUNTER — Emergency Department
Admission: EM | Admit: 2015-05-07 | Discharge: 2015-05-07 | Disposition: A | Discharge status: Home / Self Care | Payer: Commercial Managed Care - HMO | Attending: Emergency Medicine | Admitting: Emergency Medicine

## 2015-05-07 DIAGNOSIS — Z7951 Long term (current) use of inhaled steroids: Secondary | ICD-10-CM | POA: Insufficient documentation

## 2015-05-07 DIAGNOSIS — E119 Type 2 diabetes mellitus without complications: Secondary | ICD-10-CM | POA: Insufficient documentation

## 2015-05-07 DIAGNOSIS — Z79899 Other long term (current) drug therapy: Secondary | ICD-10-CM | POA: Diagnosis not present

## 2015-05-07 DIAGNOSIS — E669 Obesity, unspecified: Secondary | ICD-10-CM | POA: Insufficient documentation

## 2015-05-07 DIAGNOSIS — M545 Low back pain, unspecified: Secondary | ICD-10-CM

## 2015-05-07 DIAGNOSIS — M25551 Pain in right hip: Secondary | ICD-10-CM | POA: Insufficient documentation

## 2015-05-07 DIAGNOSIS — R51 Headache: Secondary | ICD-10-CM | POA: Diagnosis not present

## 2015-05-07 DIAGNOSIS — G8929 Other chronic pain: Secondary | ICD-10-CM | POA: Insufficient documentation

## 2015-05-07 DIAGNOSIS — I10 Essential (primary) hypertension: Secondary | ICD-10-CM | POA: Diagnosis not present

## 2015-05-07 DIAGNOSIS — R519 Headache, unspecified: Secondary | ICD-10-CM

## 2015-05-07 DIAGNOSIS — I729 Aneurysm of unspecified site: Secondary | ICD-10-CM

## 2015-05-07 DIAGNOSIS — G932 Benign intracranial hypertension: Secondary | ICD-10-CM | POA: Insufficient documentation

## 2015-05-07 DIAGNOSIS — M25552 Pain in left hip: Secondary | ICD-10-CM | POA: Diagnosis not present

## 2015-05-07 HISTORY — DX: Anemia, unspecified: D64.9

## 2015-05-07 HISTORY — DX: Gastro-esophageal reflux disease without esophagitis: K21.9

## 2015-05-07 HISTORY — DX: Essential (primary) hypertension: I10

## 2015-05-07 HISTORY — DX: Depression, unspecified: F32.A

## 2015-05-07 HISTORY — DX: Nontoxic single thyroid nodule: E04.1

## 2015-05-07 HISTORY — DX: Unspecified osteoarthritis, unspecified site: M19.90

## 2015-05-07 HISTORY — DX: Sleep apnea, unspecified: G47.30

## 2015-05-07 HISTORY — DX: Type 2 diabetes mellitus without complications: E11.9

## 2015-05-07 HISTORY — DX: Cardiac arrhythmia, unspecified: I49.9

## 2015-05-07 HISTORY — DX: Myoneural disorder, unspecified: G70.9

## 2015-05-07 HISTORY — DX: Unspecified asthma, uncomplicated: J45.909

## 2015-05-07 HISTORY — DX: Major depressive disorder, single episode, unspecified: F32.9

## 2015-05-07 HISTORY — DX: Angina pectoris, unspecified: I20.9

## 2015-05-07 HISTORY — DX: Personal history of other diseases of the digestive system: Z87.19

## 2015-05-07 HISTORY — DX: Headache: R51

## 2015-05-07 HISTORY — DX: Fibromyalgia: M79.7

## 2015-05-07 HISTORY — DX: Anxiety disorder, unspecified: F41.9

## 2015-05-07 HISTORY — DX: Headache, unspecified: R51.9

## 2015-05-07 LAB — CBC WITH DIFFERENTIAL/PLATELET
Basophils Absolute: 0.1 10*3/uL (ref 0–0.1)
Basophils Relative: 1 %
Eosinophils Absolute: 0.1 10*3/uL (ref 0–0.7)
Eosinophils Relative: 2 %
HEMATOCRIT: 38.8 % (ref 35.0–47.0)
HEMOGLOBIN: 12.5 g/dL (ref 12.0–16.0)
LYMPHS ABS: 2.6 10*3/uL (ref 1.0–3.6)
LYMPHS PCT: 32 %
MCH: 26.4 pg (ref 26.0–34.0)
MCHC: 32.3 g/dL (ref 32.0–36.0)
MCV: 81.7 fL (ref 80.0–100.0)
MONOS PCT: 7 %
Monocytes Absolute: 0.5 10*3/uL (ref 0.2–0.9)
NEUTROS ABS: 4.8 10*3/uL (ref 1.4–6.5)
NEUTROS PCT: 60 %
Platelets: 278 10*3/uL (ref 150–440)
RBC: 4.75 MIL/uL (ref 3.80–5.20)
RDW: 14.9 % — ABNORMAL HIGH (ref 11.5–14.5)
WBC: 8.1 10*3/uL (ref 3.6–11.0)

## 2015-05-07 LAB — PROTIME-INR
INR: 0.95
Prothrombin Time: 12.9 seconds (ref 11.4–15.0)

## 2015-05-07 LAB — BASIC METABOLIC PANEL
Anion gap: 8 (ref 5–15)
BUN: 10 mg/dL (ref 6–20)
CHLORIDE: 112 mmol/L — AB (ref 101–111)
CO2: 21 mmol/L — AB (ref 22–32)
CREATININE: 0.92 mg/dL (ref 0.44–1.00)
Calcium: 8.7 mg/dL — ABNORMAL LOW (ref 8.9–10.3)
GFR calc non Af Amer: >60 mL/min (ref >60)
Glucose, Bld: 162 mg/dL — ABNORMAL HIGH (ref 65–99)
POTASSIUM: 3.7 mmol/L (ref 3.5–5.1)
Sodium: 141 mmol/L (ref 135–145)

## 2015-05-07 LAB — CBC
HCT: 40.4 % (ref 35.0–47.0)
Hemoglobin: 12.9 g/dL (ref 12.0–16.0)
MCH: 26 pg (ref 26.0–34.0)
MCHC: 31.9 g/dL — ABNORMAL LOW (ref 32.0–36.0)
MCV: 81.3 fL (ref 80.0–100.0)
Platelets: 316 10*3/uL (ref 150–440)
RBC: 4.96 MIL/uL (ref 3.80–5.20)
RDW: 14.9 % — AB (ref 11.5–14.5)
WBC: 9.6 10*3/uL (ref 3.6–11.0)

## 2015-05-07 LAB — APTT: aPTT: 26 seconds (ref 24–36)

## 2015-05-07 LAB — HCG, QUANTITATIVE, PREGNANCY: hCG, Beta Chain, Quant, S: <1 m[IU]/mL (ref <5)

## 2015-05-07 MED ORDER — SODIUM CHLORIDE 0.9 % IV BOLUS (SEPSIS)
1000.0000 mL | Freq: Once | INTRAVENOUS | Status: AC
  Administered 2015-05-07: 1000 mL via INTRAVENOUS
  Filled 2015-05-07: qty 1000

## 2015-05-07 MED ORDER — HYDROMORPHONE HCL 1 MG/ML IJ SOLN
1.0000 mg | Freq: Once | INTRAMUSCULAR | Status: AC
  Administered 2015-05-07: 1 mg via INTRAVENOUS
  Filled 2015-05-07: qty 1

## 2015-05-07 MED ORDER — PREDNISONE 20 MG PO TABS: ORAL_TABLET | ORAL | Status: DC

## 2015-05-07 MED ORDER — DEXAMETHASONE SODIUM PHOSPHATE 10 MG/ML IJ SOLN
INTRAMUSCULAR | Status: AC
  Administered 2015-05-07: 17:00:00
  Filled 2015-05-07: qty 1

## 2015-05-07 MED ORDER — OXYCODONE HCL 10 MG PO TABS: 10.0000 mg | ORAL_TABLET | ORAL | Status: DC | PRN

## 2015-05-07 MED ORDER — FAMOTIDINE IN NACL 20-0.9 MG/50ML-% IV SOLN
INTRAVENOUS | Status: AC
  Administered 2015-05-07 (×2)
  Filled 2015-05-07: qty 50

## 2015-05-07 MED ORDER — HYDROMORPHONE HCL 1 MG/ML IJ SOLN
INTRAMUSCULAR | Status: AC
  Administered 2015-05-07: 17:00:00
  Filled 2015-05-07: qty 1

## 2015-05-07 MED ORDER — ONDANSETRON HCL 4 MG/2ML IJ SOLN
4.0000 mg | Freq: Once | INTRAMUSCULAR | Status: AC
  Administered 2015-05-07: 4 mg via INTRAVENOUS
  Filled 2015-05-07: qty 2

## 2015-05-07 MED ORDER — KETOROLAC TROMETHAMINE 30 MG/ML IJ SOLN
INTRAMUSCULAR | Status: AC
  Administered 2015-05-07: 30 mg via INTRAVENOUS
  Filled 2015-05-07: qty 1

## 2015-05-07 MED ORDER — KETOROLAC TROMETHAMINE 30 MG/ML IJ SOLN
30.0000 mg | Freq: Once | INTRAMUSCULAR | Status: AC
  Administered 2015-05-07 (×2): 30 mg via INTRAVENOUS
  Filled 2015-05-07: qty 1

## 2015-05-07 NOTE — Discharge Instructions (Signed)
Take oxycodone if needed for pain. You're given a dose of Decadron in the emergency department, but we will complement this with prednisone over the next 4 days. Follow-up with your neurologist, Dr. Melrose Nakayama. Follow-up with your pain doctor and your regular doctor as well. Return to the emergency department if you have uncontrolled pain, difficulty walking, or other urgent concerns.  Back Pain, Adult Low back pain is very common. About 1 in 5 people have back pain.The cause of low back pain is rarely dangerous. The pain often gets better over time.About half of people with a sudden onset of back pain feel better in just 2 weeks. About 8 in 10 people feel better by 6 weeks.  CAUSES Some common causes of back pain include:  Strain of the muscles or ligaments supporting the spine.  Wear and tear (degeneration) of the spinal discs.  Arthritis.  Direct injury to the back. DIAGNOSIS Most of the time, the direct cause of low back pain is not known.However, back pain can be treated effectively even when the exact cause of the pain is unknown.Answering your caregiver's questions about your overall health and symptoms is one of the most accurate ways to make sure the cause of your pain is not dangerous. If your caregiver needs more information, he or she may order lab work or imaging tests (X-rays or MRIs).However, even if imaging tests show changes in your back, this usually does not require surgery. HOME CARE INSTRUCTIONS For many people, back pain returns.Since low back pain is rarely dangerous, it is often a condition that people can learn to [facility] their own.   Remain active. It is stressful on the back to sit or stand in one place. Do not sit, drive, or stand in one place for more than 30 minutes at a time. Take short walks on level surfaces as soon as pain allows.Try to increase the length of time you walk each day.  Do not stay in bed.Resting more than 1 or 2 days can delay your  recovery.  Do not avoid exercise or work.Your body is made to move.It is not dangerous to be active, even though your back may hurt.Your back will likely heal faster if you return to being active before your pain is gone.  Pay attention to your body when you bend and lift. Many people have less discomfortwhen lifting if they bend their knees, keep the load close to their bodies,and avoid twisting. Often, the most comfortable positions are those that put less stress on your recovering back.  Find a comfortable position to sleep. Use a firm mattress and lie on your side with your knees slightly bent. If you lie on your back, put a pillow under your knees.  Only take over-the-counter or prescription medicines as directed by your caregiver. Over-the-counter medicines to reduce pain and inflammation are often the most helpful.Your caregiver may prescribe muscle relaxant drugs.These medicines help dull your pain so you can more quickly return to your normal activities and healthy exercise.  Put ice on the injured area.  Put ice in a plastic bag.  Place a towel between your skin and the bag.  Leave the ice on for 15-20 minutes, 03-04 times a day for the first 2 to 3 days. After that, ice and heat may be alternated to reduce pain and spasms.  Ask your caregiver about trying back exercises and gentle massage. This may be of some benefit.  Avoid feeling anxious or stressed.Stress increases muscle tension and can worsen  back pain.It is important to recognize when you are anxious or stressed and learn ways to manage it.Exercise is a great option. SEEK MEDICAL CARE IF:  You have pain that is not relieved with rest or medicine.  You have pain that does not improve in 1 week.  You have new symptoms.  You are generally not feeling well. SEEK IMMEDIATE MEDICAL CARE IF:   You have pain that radiates from your back into your legs.  You develop new bowel or bladder control problems.  You  have unusual weakness or numbness in your arms or legs.  You develop nausea or vomiting.  You develop abdominal pain.  You feel faint. Document Released: 08/30/2005 Document Revised: 02/29/2012 Document Reviewed: 01/01/2014 [facility] Patient Information 2015 [place], [place]. This information is not intended to replace advice given to you by your health care provider. Make sure you discuss any questions you have with your health care provider.

## 2015-05-07 NOTE — ED Notes (Addendum)
Pt from special procedures for a LP under fluroscopy, no LP was obtained after 10 attempts, pt was to be DC home, but pt was unable to get up and c/o pain, sent to ED for Eval.

## 2015-05-07 NOTE — ED Provider Notes (Signed)
[facility] Emergency Department Provider Note  ____________________________________________  Time seen: 1244  I have reviewed the triage vital signs and the nursing notes.   HISTORY  Chief Complaint Back Pain  status post lumbar puncture attempts in special procedures    HPI Karen Lewis is a 44 y.o. female who is obese (410 pounds) and who has suspected intracranial hypertension. She is under the evaluation of Dr. Melrose Nakayama, neurology, [facility]. He had ordered a lumbar puncture through special procedures for both diagnostic and therapeutic reasons. This morning she went there and they made multiple attempts to obtain spinal fluid and check an opening pressure, but they were never able to tap into the thecal space. After making multiple attempts, they ceased their efforts. The patient had ongoing pain in the area of the attempts and was unable to stand up and ambulate. Due to her ongoing pain and difficulty ambulating, she was sent to the emergency department.  Here in the emergency department, the patient reports ongoing pain in her lower back and into her hips. She reports the location and character of the pain is similar to her baseline chronic pain issues, but worse in severity. She denies any pain shooting down her back. She denies any weakness or loss of sensation in her distal legs or feet.    Past Medical History  Diagnosis Date  . Anginal pain   . Hypertension   . Dysrhythmia   . Asthma   . Sleep apnea   . Thyroid nodule bilateral  . Diabetes mellitus without complication   . Depression   . Anxiety   . GERD (gastroesophageal reflux disease)   . History of hiatal hernia   . Headache   . Neuromuscular disorder   . Fibromyalgia   . Arthritis   . Anemia     Patient Active Problem List   Diagnosis Date Noted  . History of asthma 03/10/2015  . Aggrieved 03/10/2015  . Binge eating disorder 03/10/2015  . H/O diabetes mellitus 03/10/2015  .  H/O gastrointestinal disease 03/10/2015  . Fibromyalgia 03/10/2015  . Anxiety, generalized 03/10/2015  . H/O: HTN (hypertension) 03/10/2015  . Insomnia, persistent 03/10/2015  . Depression, major, recurrent, moderate 03/10/2015  . History of migraine headaches 03/10/2015  . H/O: obesity 03/10/2015  . H/O: osteoarthritis 03/10/2015  . H/O disease 03/10/2015  . H/O thyroid disease 03/10/2015    Past Surgical History  Procedure Laterality Date  . Laparoscopic partial gastrectomy    . Shoulder arthroscopy Right   . Joint replacement Left hip  . Carpal tunnel release Bilateral   . Diagnostic laparoscopy    . Appendectomy      Current Outpatient Rx  Name  Route  Sig  Dispense  Refill  . amitriptyline (ELAVIL) 100 MG tablet   Oral   Take 100 mg by mouth at bedtime.          Marland Kitchen atorvastatin (LIPITOR) 40 MG tablet   Oral   Take 40 mg by mouth daily at 6 PM.          . cetirizine (ZYRTEC) 5 MG tablet   Oral   Take 5 mg by mouth daily.          . clonazePAM (KLONOPIN) 0.5 MG tablet   Oral   Take 0.5 mg by mouth as needed.          . ergocalciferol (VITAMIN D2) 50000 UNITS capsule   Oral   Take 50,000 Units by mouth once a  week.         . Fluticasone-Salmeterol (ADVAIR DISKUS) 500-50 MCG/DOSE AEPB   Inhalation   Inhale 1 puff into the lungs 2 (two) times daily.          Marland Kitchen gabapentin (NEURONTIN) 800 MG tablet   Oral   Take 3,600 mg by mouth 2 (two) times daily. 1200 in the am and 1500 in the pm.         . Hydrocodone-Acetaminophen (VICODIN ES) 7.5-300 MG TABS   Oral   Take 1 tablet by mouth as needed.          Marland Kitchen lisdexamfetamine (VYVANSE) 60 MG capsule   Oral   Take 1 capsule (60 mg total) by mouth every morning.   30 capsule   0   . magnesium oxide (MAG-OX) 400 MG tablet   Oral   Take 400 mg by mouth 2 (two) times daily.         . metFORMIN (GLUCOPHAGE) 1000 MG tablet   Oral   Take 1,000 mg by mouth 2 (two) times daily with a meal.           . metoprolol (LOPRESSOR) 50 MG tablet   Oral   Take 50 mg by mouth 2 (two) times daily.          . montelukast (SINGULAIR) 10 MG tablet   Oral   Take 10 mg by mouth at bedtime.          . Multiple Vitamin (MULTIVITAMIN) capsule   Oral   Take 2 capsules by mouth daily.         Marland Kitchen omeprazole (PRILOSEC) 40 MG capsule   Oral   Take 40 mg by mouth 2 (two) times daily.          Marland Kitchen oxyCODONE-acetaminophen (PERCOCET/ROXICET) 5-325 MG per tablet   Oral   Take by mouth every 4 (four) hours as needed for severe pain.         Marland Kitchen sertraline (ZOLOFT) 100 MG tablet      TAKE 2 TABLETS EVERY DAY   180 tablet   0   . spironolactone (ALDACTONE) 25 MG tablet   Oral   Take 12.5 mg by mouth 2 (two) times daily. 25mg  bid 12.5mg          . topiramate (TOPAMAX) 100 MG tablet   Oral   Take 150 mg by mouth 2 (two) times daily.          Marland Kitchen zolpidem (AMBIEN) 10 MG tablet   Oral   Take 10 mg by mouth at bedtime as needed for sleep.         Marland Kitchen acyclovir (ZOVIRAX) 800 MG tablet   Oral   Take 800 mg by mouth 2 (two) times daily.          Marland Kitchen albuterol (PROVENTIL HFA;VENTOLIN HFA) 108 (90 BASE) MCG/ACT inhaler   Inhalation   Inhale 2 puffs into the lungs every 6 (six) hours as needed for wheezing or shortness of breath.         . Butalbital-APAP-Caff-Cod (FIORICET/CODEINE) 50-300-40-30 MG CAPS   Oral   Take 1 tablet by mouth as needed.          . DULoxetine (CYMBALTA) 60 MG capsule   Oral   Take 60 mg by mouth.          . Oxycodone HCl 10 MG TABS   Oral   Take 1 tablet (10 mg total) by mouth every 4 (four) hours as needed.  16 tablet   0   . predniSONE (DELTASONE) 20 MG tablet      Take 2 tablets the first and second day, then take one tablet a day for 2 more days.   6 tablet   0     Allergies Bactrim; Ciprofloxacin; Clindamycin; Sulfa antibiotics; and Nsaids  History reviewed. No pertinent family history.  Social History Social History  Substance Use  Topics  . Smoking status: Never Smoker   . Smokeless tobacco: None  . Alcohol Use: No    Review of Systems  Constitutional: Negative for fever. ENT: Negative for sore throat. Cardiovascular: Negative for chest pain. Respiratory: Negative for shortness of breath. Gastrointestinal: Negative for abdominal pain, vomiting and diarrhea. Genitourinary: Negative for dysuria. Musculoskeletal: History of fibromyalgia, chronic back pain and pain in the hips. Currently exacerbated. See history of present illness Skin: Negative for rash. Neurological: Negative for headaches   10-point ROS otherwise negative.  ____________________________________________   PHYSICAL EXAM:  VITAL SIGNS: ED Triage Vitals  Enc Vitals Group     BP --      Pulse Rate 05/07/15 1145 89     Resp --      Temp 05/07/15 1145 98.2 F (36.8 C)     Temp Source 05/07/15 1145 Oral     SpO2 --      Weight 05/07/15 1145 410 lb (185.975 kg)     Height 05/07/15 1145 5\' 4"  (1.626 m)     Head Cir --      Peak Flow --      Pain Score 05/07/15 1146 6     Pain Loc --      Pain Edu? --      Excl. in [place]? --     Constitutional: Alert and oriented. Large body habitus. She is lying on her left side in a position of comfort. She appears to be in mild discomfort. ENT   Head: Normocephalic and atraumatic.   Nose: No congestion/rhinnorhea. Cardiovascular: Normal rate, regular rhythm, no murmur noted Respiratory:  Normal respiratory effort, no tachypnea.    Breath sounds are clear and equal bilaterally.  Gastrointestinal: Large, soft and nontender. No distention.  Back: There are 2 Band-Aids present over the lumbar puncture attempt sites. There is no significant erythema. This local area is somewhat tender. This is at the L3-L5 area. The paraspinal muscles on either side are mildly tender, but not disproportionally so according to the patient. There is no notable muscle spasm in this area.. Musculoskeletal: No deformity  noted. Nontender with normal range of motion in all extremities.  No noted edema. Neurologic:  Normal speech and language. No gross focal neurologic deficits are appreciated. The patient is able to turn herself in the bed from left lateral recumbent into a supine position. She has some pain with this movement. The strength in her arms and legs are 5 over 5. She has intact sensation in her extremities. She has normal dorsi and plantar flexion. Skin:  Skin is warm, dry. No rash noted. No erythema in the area of needle insertion. Psychiatric: Mood and affect are normal. Speech and behavior are normal.  ____________________________________________    LABS (pertinent positives/negatives)  Labs Reviewed  CBC WITH DIFFERENTIAL/PLATELET - Abnormal; Notable for the following:    RDW 14.9 (*)    All other components within normal limits  BASIC METABOLIC PANEL - Abnormal; Notable for the following:    Chloride 112 (*)    CO2 21 (*)    Glucose,  Bld 162 (*)    Calcium 8.7 (*)    All other components within normal limits     ____________________________________________   ____________________________________________   INITIAL IMPRESSION / ASSESSMENT AND PLAN / ED COURSE  Pertinent labs & imaging results that were available during my care of the patient were reviewed by me and considered in my medical decision making (see chart for details).  Difficult situation for this 410 pound woman with focal pain at the site of needle insertion for her attempted lumbar puncture.  I have spoken with Dr. Golden Circle, one of the radiologists who attempted to help this woman with an attempt at lumbar puncture. He feels confident that he or his colleagues into the thecal space. He does not believe there is any significant risk of a epidural hematoma.   I have spoken with Dr. Jayme Cloud, neurology. Reviewing the patient's situation regarding likely intracranial hypertension and the attempts at lumbar puncture, he  agrees that the focus currently is pain control with attempts to help the patient ambulate.  I have ordered pain medication and a nonsteroidal anti-inflammatory for the patient. The patient does have a history of nausea with nonsteroidals. I am hoping that the IV route will be better for her, but also treating her with the Zofran.  If she is unable to ambulate with [place] today, she will likely need to be transferred to another hospital, as her weight is beyond the limit for the CT scanner scared [facility]. Further evaluation here would be limited.  ----------------------------------------- 6:03 PM on 05/07/2015 -----------------------------------------  After the initial round of pain medications, that included Toradol, the patient had some relief and was able to ambulate to the toilet and back. We further discussed options, including transfer to another facility that may have rated her capability to perform diagnostics. The patient and I agreed that we would continue to attempt greater pain control here. 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 She and I agreed on treating her with one more milligram of Dilaudid, another 30 mg of Toradol, and a dose of Decadron.  At this time, the patient feels significantly better. She is able to sit up in the bed without distress.   She is unable to take nonsteroidal anti-inflammatories by mouth. We will prescribe steroids for the next 4 days. She has some oxycodone at home that is prescribed by pain management. These are 5 mg tablets. I do not think this will be adequate. I will add a prescription for 10 mg oxycodone tablets over the next 3-4 days.  The patient will follow-up with her primary physician as well as with her neurologist, Dr. Melrose Nakayama.   ____________________________________________   FINAL CLINICAL IMPRESSION(S) / ED DIAGNOSES  Final diagnoses:  Bilateral low back pain without sciatica  Obesity  Chronic  nonintractable headache, unspecified headache type      Ahmed Prima, MD 05/07/15 1815

## 2015-05-07 NOTE — ED Notes (Signed)
Pt amb to [facility] w/o assist, pt remains in pain, spoke with Dr Thomasene Lot, he will speak with pt about DC

## 2015-05-08 ENCOUNTER — Emergency Department (HOSPITAL_COMMUNITY)
Admission: EM | Admit: 2015-05-08 | Discharge: 2015-05-09 | Disposition: A | Discharge status: Home / Self Care | Payer: Commercial Managed Care - HMO | Attending: Emergency Medicine | Admitting: Emergency Medicine

## 2015-05-08 ENCOUNTER — Emergency Department (HOSPITAL_COMMUNITY): Payer: Commercial Managed Care - HMO

## 2015-05-08 ENCOUNTER — Encounter (HOSPITAL_COMMUNITY): Payer: Self-pay | Admitting: Emergency Medicine

## 2015-05-08 DIAGNOSIS — J45909 Unspecified asthma, uncomplicated: Secondary | ICD-10-CM | POA: Diagnosis not present

## 2015-05-08 DIAGNOSIS — K219 Gastro-esophageal reflux disease without esophagitis: Secondary | ICD-10-CM | POA: Diagnosis not present

## 2015-05-08 DIAGNOSIS — Z862 Personal history of diseases of the blood and blood-forming organs and certain disorders involving the immune mechanism: Secondary | ICD-10-CM | POA: Diagnosis not present

## 2015-05-08 DIAGNOSIS — R519 Headache, unspecified: Secondary | ICD-10-CM

## 2015-05-08 DIAGNOSIS — R05 Cough: Secondary | ICD-10-CM | POA: Diagnosis not present

## 2015-05-08 DIAGNOSIS — E119 Type 2 diabetes mellitus without complications: Secondary | ICD-10-CM | POA: Insufficient documentation

## 2015-05-08 DIAGNOSIS — M1990 Unspecified osteoarthritis, unspecified site: Secondary | ICD-10-CM | POA: Diagnosis not present

## 2015-05-08 DIAGNOSIS — F329 Major depressive disorder, single episode, unspecified: Secondary | ICD-10-CM | POA: Diagnosis not present

## 2015-05-08 DIAGNOSIS — G709 Myoneural disorder, unspecified: Secondary | ICD-10-CM | POA: Insufficient documentation

## 2015-05-08 DIAGNOSIS — E669 Obesity, unspecified: Secondary | ICD-10-CM | POA: Insufficient documentation

## 2015-05-08 DIAGNOSIS — Z79899 Other long term (current) drug therapy: Secondary | ICD-10-CM | POA: Insufficient documentation

## 2015-05-08 DIAGNOSIS — R51 Headache: Secondary | ICD-10-CM | POA: Insufficient documentation

## 2015-05-08 DIAGNOSIS — Z7951 Long term (current) use of inhaled steroids: Secondary | ICD-10-CM | POA: Insufficient documentation

## 2015-05-08 DIAGNOSIS — I209 Angina pectoris, unspecified: Secondary | ICD-10-CM | POA: Diagnosis not present

## 2015-05-08 DIAGNOSIS — I10 Essential (primary) hypertension: Secondary | ICD-10-CM | POA: Diagnosis not present

## 2015-05-08 DIAGNOSIS — F419 Anxiety disorder, unspecified: Secondary | ICD-10-CM | POA: Diagnosis not present

## 2015-05-08 DIAGNOSIS — R509 Fever, unspecified: Secondary | ICD-10-CM | POA: Insufficient documentation

## 2015-05-08 HISTORY — DX: Obesity, unspecified: E66.9

## 2015-05-08 LAB — COMPREHENSIVE METABOLIC PANEL
ALT: 17 U/L (ref 14–54)
AST: 26 U/L (ref 15–41)
Albumin: 3.5 g/dL (ref 3.5–5.0)
Alkaline Phosphatase: 76 U/L (ref 38–126)
Anion gap: 8 (ref 5–15)
BUN: 10 mg/dL (ref 6–20)
CO2: 21 mmol/L — ABNORMAL LOW (ref 22–32)
Calcium: 8.8 mg/dL — ABNORMAL LOW (ref 8.9–10.3)
Chloride: 109 mmol/L (ref 101–111)
Creatinine, Ser: 0.84 mg/dL (ref 0.44–1.00)
GFR calc Af Amer: >60 mL/min (ref >60)
GFR calc non Af Amer: >60 mL/min (ref >60)
Glucose, Bld: 179 mg/dL — ABNORMAL HIGH (ref 65–99)
Potassium: 3.5 mmol/L (ref 3.5–5.1)
Sodium: 138 mmol/L (ref 135–145)
Total Bilirubin: <0.1 mg/dL — ABNORMAL LOW (ref 0.3–1.2)
Total Protein: 6.6 g/dL (ref 6.5–8.1)

## 2015-05-08 LAB — CBC WITH DIFFERENTIAL/PLATELET
Basophils Absolute: 0 10*3/uL (ref 0.0–0.1)
Basophils Relative: 0 % (ref 0–1)
Eosinophils Absolute: 0 10*3/uL (ref 0.0–0.7)
Eosinophils Relative: 0 % (ref 0–5)
HCT: 38.1 % (ref 36.0–46.0)
Hemoglobin: 12.5 g/dL (ref 12.0–15.0)
Lymphocytes Relative: 23 % (ref 12–46)
Lymphs Abs: 2.6 10*3/uL (ref 0.7–4.0)
MCH: 27 pg (ref 26.0–34.0)
MCHC: 32.8 g/dL (ref 30.0–36.0)
MCV: 82.3 fL (ref 78.0–100.0)
Monocytes Absolute: 0.8 10*3/uL (ref 0.1–1.0)
Monocytes Relative: 7 % (ref 3–12)
Neutro Abs: 7.8 10*3/uL — ABNORMAL HIGH (ref 1.7–7.7)
Neutrophils Relative %: 70 % (ref 43–77)
Platelets: 306 10*3/uL (ref 150–400)
RBC: 4.63 MIL/uL (ref 3.87–5.11)
RDW: 14.4 % (ref 11.5–15.5)
WBC: 11.2 10*3/uL — ABNORMAL HIGH (ref 4.0–10.5)

## 2015-05-08 MED ORDER — HYDROMORPHONE HCL 1 MG/ML IJ SOLN
2.0000 mg | Freq: Once | INTRAMUSCULAR | Status: AC
  Administered 2015-05-08: 2 mg via INTRAMUSCULAR
  Filled 2015-05-08: qty 2

## 2015-05-08 MED ORDER — KETOROLAC TROMETHAMINE 30 MG/ML IJ SOLN
30.0000 mg | Freq: Once | INTRAMUSCULAR | Status: AC
  Administered 2015-05-08: 30 mg via INTRAMUSCULAR
  Filled 2015-05-08: qty 1

## 2015-05-08 MED ORDER — DIAZEPAM 5 MG/ML IJ SOLN
5.0000 mg | Freq: Once | INTRAMUSCULAR | Status: AC
  Administered 2015-05-08: 5 mg via INTRAMUSCULAR
  Filled 2015-05-08: qty 2

## 2015-05-08 NOTE — ED Notes (Signed)
Pt. explained process and  wait time to pt.

## 2015-05-08 NOTE — ED Notes (Signed)
Pt. reports fever at home this evening 99.5 and chronic headache for several years worse today , pt. seen at [facility] yesterday multiple  lumbar punctures done with no success.

## 2015-05-09 DIAGNOSIS — R51 Headache: Secondary | ICD-10-CM | POA: Diagnosis not present

## 2015-05-09 DIAGNOSIS — E669 Obesity, unspecified: Secondary | ICD-10-CM | POA: Diagnosis not present

## 2015-05-09 DIAGNOSIS — F419 Anxiety disorder, unspecified: Secondary | ICD-10-CM | POA: Diagnosis not present

## 2015-05-09 DIAGNOSIS — R509 Fever, unspecified: Secondary | ICD-10-CM | POA: Diagnosis not present

## 2015-05-09 DIAGNOSIS — I10 Essential (primary) hypertension: Secondary | ICD-10-CM | POA: Diagnosis not present

## 2015-05-09 DIAGNOSIS — F329 Major depressive disorder, single episode, unspecified: Secondary | ICD-10-CM | POA: Diagnosis not present

## 2015-05-09 DIAGNOSIS — I209 Angina pectoris, unspecified: Secondary | ICD-10-CM | POA: Diagnosis not present

## 2015-05-09 DIAGNOSIS — J45909 Unspecified asthma, uncomplicated: Secondary | ICD-10-CM | POA: Diagnosis not present

## 2015-05-09 DIAGNOSIS — E119 Type 2 diabetes mellitus without complications: Secondary | ICD-10-CM | POA: Diagnosis not present

## 2015-05-09 LAB — URINE MICROSCOPIC-ADD ON

## 2015-05-09 LAB — URINALYSIS, ROUTINE W REFLEX MICROSCOPIC
Glucose, UA: >1000 mg/dL — AB
Hgb urine dipstick: NEGATIVE
Ketones, ur: 15 mg/dL — AB
Leukocytes, UA: NEGATIVE
Nitrite: NEGATIVE
Protein, ur: NEGATIVE mg/dL
Specific Gravity, Urine: 1.025 (ref 1.005–1.030)
Urobilinogen, UA: 0.2 mg/dL (ref 0.0–1.0)
pH: 6 (ref 5.0–8.0)

## 2015-05-09 LAB — RAPID STREP SCREEN (MED CTR MEBANE ONLY): Streptococcus, Group A Screen (Direct): NEGATIVE

## 2015-05-09 MED ORDER — ACETAZOLAMIDE 250 MG PO TABS
500.0000 mg | ORAL_TABLET | Freq: Once | ORAL | Status: AC
  Administered 2015-05-09: 500 mg via ORAL
  Filled 2015-05-09: qty 2

## 2015-05-09 MED ORDER — ACETAZOLAMIDE 250 MG PO TABS: 250.0000 mg | ORAL_TABLET | Freq: Two times a day (BID) | ORAL | Status: DC

## 2015-05-09 MED ORDER — FAMOTIDINE 20 MG PO TABS
20.0000 mg | ORAL_TABLET | Freq: Once | ORAL | Status: AC
  Administered 2015-05-09: 20 mg via ORAL
  Filled 2015-05-09: qty 1

## 2015-05-09 MED ORDER — ONDANSETRON HCL 4 MG/2ML IJ SOLN: 4.0000 mg | Freq: Once | INTRAMUSCULAR | Status: DC | PRN

## 2015-05-09 MED ORDER — MORPHINE SULFATE (PF) 4 MG/ML IV SOLN: 6.0000 mg | Freq: Once | INTRAVENOUS | Status: DC | PRN

## 2015-05-09 MED ORDER — HYDROMORPHONE HCL 1 MG/ML IJ SOLN
1.5000 mg | Freq: Once | INTRAMUSCULAR | Status: AC
  Administered 2015-05-09: 1.5 mg via INTRAMUSCULAR
  Filled 2015-05-09: qty 2

## 2015-05-09 NOTE — ED Provider Notes (Signed)
CSN: [id number]     Arrival date & time 05/08/15  2135 History   First MD Initiated Contact with Patient 05/08/15 2210     Chief Complaint  Patient presents with  . Fever     (Consider location/radiation/quality/duration/timing/severity/associated sxs/prior Treatment) HPI   44 year old female with headache. Patient has chronic headaches, but has been worse for the past 2 days. She reports past history of pseudotumor cerebra. She had attempted lumbar puncture yesterday without success. She reports increased pain after. She says that this feels like "a spinal headache," but it does not seem like she actually had a successful LP. She was evaluated in the emergency room at [facility] yesterday. She is treated symptomatically with improvement of her symptoms. Contemplated CT imaging, but patient's weight/body habitus precluded this. She had increasing headache throughout today. She reports that it's worse when she sits upright. Improve when supine. No fevers or chills. No acute neurological complaints. Subjective fever. She has been taking consistently throughout the day. No neck pain or neck stiffness. She is followed by neurology. She was unable to get in touch with them today. She was previously taken Diamox, but not currently.  Past Medical History  Diagnosis Date  . Anginal pain   . Hypertension   . Dysrhythmia   . Asthma   . Sleep apnea   . Thyroid nodule bilateral  . Diabetes mellitus without complication   . Depression   . Anxiety   . GERD (gastroesophageal reflux disease)   . History of hiatal hernia   . Headache   . Neuromuscular disorder   . Fibromyalgia   . Arthritis   . Anemia   . Obesity    Past Surgical History  Procedure Laterality Date  . Laparoscopic partial gastrectomy    . Shoulder arthroscopy Right   . Joint replacement Left hip  . Carpal tunnel release Bilateral   . Diagnostic laparoscopy    . Appendectomy     No family history on file. Social  History  Substance Use Topics  . Smoking status: Never Smoker   . Smokeless tobacco: None  . Alcohol Use: No   OB History    No data available     Review of Systems    Allergies  Bactrim; Ciprofloxacin; Clindamycin; Sulfa antibiotics; and Nsaids  Home Medications   Prior to Admission medications   Medication Sig Start Date End Date Taking? Authorizing Provider  acyclovir (ZOVIRAX) 800 MG tablet Take 800 mg by mouth 2 (two) times daily.    Yes Historical Provider, MD  albuterol (PROVENTIL HFA;VENTOLIN HFA) 108 (90 BASE) MCG/ACT inhaler Inhale 2 puffs into the lungs every 6 (six) hours as needed for wheezing or shortness of breath.   Yes Historical Provider, MD  amitriptyline (ELAVIL) 100 MG tablet Take 100 mg by mouth at bedtime.    Yes Historical Provider, MD  atorvastatin (LIPITOR) 40 MG tablet Take 40 mg by mouth daily at 6 PM.    Yes Historical Provider, MD  Butalbital-APAP-Caff-Cod (FIORICET/CODEINE) 50-300-40-30 MG CAPS Take 1 tablet by mouth as needed (for headache).    Yes Historical Provider, MD  cetirizine (ZYRTEC) 5 MG tablet Take 5 mg by mouth daily.    Yes Historical Provider, MD  clonazePAM (KLONOPIN) 0.5 MG tablet Take 0.5 mg by mouth 2 (two) times daily as needed for anxiety.  07/08/14  Yes Historical Provider, MD  ergocalciferol (VITAMIN D2) 50000 UNITS capsule Take 50,000 Units by mouth once a week.   Yes Historical Provider, MD  Fluticasone-Salmeterol (ADVAIR DISKUS) 500-50 MCG/DOSE AEPB Inhale 1 puff into the lungs 2 (two) times daily.    Yes Historical Provider, MD  gabapentin (NEURONTIN) 800 MG tablet Take 2,400 mg by mouth 2 (two) times daily. 05/08/15  Yes Historical Provider, MD  Hydrocodone-Acetaminophen (VICODIN ES) 7.5-300 MG TABS Take 1 tablet by mouth as needed (for pain).    Yes Historical Provider, MD  lisdexamfetamine (VYVANSE) 60 MG capsule Take 1 capsule (60 mg total) by mouth every morning. 03/26/15  Yes Marjie Skiff, MD  metFORMIN (GLUCOPHAGE)  1000 MG tablet Take 1,000 mg by mouth 2 (two) times daily with a meal.    Yes Historical Provider, MD  metoprolol (LOPRESSOR) 50 MG tablet Take 50 mg by mouth 2 (two) times daily.    Yes Historical Provider, MD  montelukast (SINGULAIR) 10 MG tablet Take 10 mg by mouth at bedtime.    Yes Historical Provider, MD  Multiple Vitamin (MULTIVITAMIN WITH MINERALS) TABS tablet Take 2 tablets by mouth daily.   Yes Historical Provider, MD  omeprazole (PRILOSEC) 40 MG capsule Take 40 mg by mouth 2 (two) times daily.    Yes Historical Provider, MD  Oxycodone HCl 10 MG TABS Take 1 tablet (10 mg total) by mouth every 4 (four) hours as needed. 05/07/15  Yes Ahmed Prima, MD  oxyCODONE-acetaminophen (PERCOCET/ROXICET) 5-325 MG per tablet Take by mouth every 4 (four) hours as needed for severe pain.   Yes Historical Provider, MD  predniSONE (DELTASONE) 20 MG tablet Take 2 tablets the first and second day, then take one tablet a day for 2 more days. 05/07/15  Yes Ahmed Prima, MD  sertraline (ZOLOFT) 100 MG tablet TAKE 2 TABLETS EVERY DAY 05/06/15  Yes Marjie Skiff, MD  spironolactone (ALDACTONE) 25 MG tablet Take 12.5 mg by mouth 2 (two) times daily.    Yes Historical Provider, MD  topiramate (TOPAMAX) 100 MG tablet Take 150 mg by mouth 2 (two) times daily.    Yes Historical Provider, MD  zolpidem (AMBIEN) 10 MG tablet Take 10 mg by mouth at bedtime as needed for sleep.   Yes Historical Provider, MD  acetaZOLAMIDE (DIAMOX) 250 MG tablet Take 1 tablet (250 mg total) by mouth 2 (two) times daily. 05/09/15   Virgel Manifold, MD  magnesium oxide (MAG-OX) 400 MG tablet Take 400 mg by mouth 2 (two) times daily.    Historical Provider, MD   BP 135/68 mmHg  Pulse 83  Temp(Src) 97.8 F (36.6 C) (Oral)  Resp 16  Ht 5\' 4"  (1.626 m)  Wt 421 lb (190.964 kg)  BMI 72.23 kg/m2  SpO2 100% Physical Exam  Constitutional: She is oriented to person, place, and time. She appears well-developed and well-nourished. No  distress.  Laying in bed. No acute distress. Obese.  HENT:  Head: Normocephalic and atraumatic.  Eyes: Conjunctivae are normal. Right eye exhibits no discharge. Left eye exhibits no discharge.  Neck: Neck supple.  No nuchal rigidity  Cardiovascular: Normal rate, regular rhythm and normal heart sounds.  Exam reveals no gallop and no friction rub.   No murmur heard. Pulmonary/Chest: Effort normal and breath sounds normal. No respiratory distress.  Abdominal: Soft. She exhibits no distension. There is no tenderness.  Musculoskeletal: She exhibits no edema or tenderness.  Mild ecchymosis noted to lower back. No appreciable tenderness to palpation.  Neurological: She is alert and oriented to person, place, and time. No cranial nerve deficit. She exhibits normal muscle tone. Coordination normal.  Speech clear.  Content appropriate. Follows commands. Cranial nerves II through XII are intact. Strength is 5 out of 5 bilateral upper and lower extremities. Sensation is intact to light touch. Good finger to nose testing bilaterally.  Skin: Skin is warm and dry.  Psychiatric: She has a normal mood and affect. Her behavior is normal. Thought content normal.  Nursing note and vitals reviewed.   ED Course  Procedures (including critical care time) Labs Review Labs Reviewed  CBC WITH DIFFERENTIAL/PLATELET - Abnormal; Notable for the following:    WBC 11.2 (*)    Neutro Abs 7.8 (*)    All other components within normal limits  COMPREHENSIVE METABOLIC PANEL - Abnormal; Notable for the following:    CO2 21 (*)    Glucose, Bld 179 (*)    Calcium 8.8 (*)    Total Bilirubin <0.1 (*)    All other components within normal limits  RAPID STREP SCREEN (NOT AT [facility])  URINALYSIS, ROUTINE W REFLEX MICROSCOPIC (NOT AT [facility])    Imaging Review Ct Head Wo Contrast  05/08/2015   CLINICAL DATA:  44 year old female with frontal headache for 2 days  EXAM: CT HEAD WITHOUT CONTRAST  TECHNIQUE: Contiguous axial images  were obtained from the base of the skull through the vertex without intravenous contrast.  COMPARISON:  None.  FINDINGS: No acute cortical infarct, hemorrhage, or mass lesion ispresent. Ventricles are of normal size. No significant extra-axial fluid collection is present. The paranasal sinuses andmastoid air cells are clear. The osseous skull is intact.  IMPRESSION: 1. Normal brain.  No acute intracranial abnormalities.   Electronically Signed   By: Kerby Moors M.D.   On: 05/08/2015 23:39   Dg Chest Portable 1 View  05/09/2015   CLINICAL DATA:  Headache, cough, and fever since lumbar puncture yesterday.  EXAM: PORTABLE CHEST - 1 VIEW  COMPARISON:  01/02/2014  FINDINGS: Mild prominence of heart size probably normal for technique. No vascular congestion. No focal consolidation in the lungs. No blunting of costophrenic angles. No pneumothorax. Mediastinal contours appear intact.  IMPRESSION: No active disease.   Electronically Signed   By: Lucienne Capers M.D.   On: 05/09/2015 00:12   Dg Fluoro Guide Lumbar Puncture  05/07/2015   CLINICAL DATA:  Pseudotumor cerebral read; therapeutic lumbar puncture  EXAM: DIAGNOSTIC LUMBAR PUNCTURE UNDER FLUOROSCOPIC GUIDANCE  FLUOROSCOPY TIME:  Fluoroscopy Time (in minutes and seconds): 18 minutes, 36 seconds  Number of Acquired Images:  4  PROCEDURE: Informed consent was obtained from the patient prior to the procedure, including potential complications of headache, allergy, and pain. With the patient prone, the lower back was prepped with Betadine. 1% Lidocaine was used for local anesthesia. The initial attempts were performed by me. Multiple attempts at the L3-4 and L4-5 levels were performed by me. These were unsuccessful. A total of 15 cc of subcutaneous lidocaine was employed during this. Twenty and 22 gauge needles were employed without success. A total of 7 minutes fluoro time was employed for this portion of the study.  Dr. Golden Circle was asked to attempt the  procedure. Again multiple attempts were made which were unsuccessful. The patient tolerated the prolonged procedure well.  Consideration for performing the study under CT guidance was given but patient is waiting seated the CT table limits.  IMPRESSION: Unsuccessful attempts at lumbar puncture by two radiologists due to the patient's body habitus.   Electronically Signed   By: David  Martinique M.D.   On: 05/07/2015 10:28   I have personally reviewed  and evaluated these images and lab results as part of my medical decision-making.   EKG Interpretation None      MDM   Final diagnoses:  Nonintractable headache, unspecified chronicity pattern, unspecified headache type    44 year old female with headache. Nonfocal neurological examination. She is concerned for "a spinal headache." It appears that LP temperature unsuccessful low. Regardless, I have a low suspicion for emergent process. CT today does not show any acute abnormality. Treated symptomatically with improvement of her symptoms. She has since been complaining of a sore throat. Mild pharyngeal erythema. No evidence of deep space neck infection.    Virgel Manifold, MD 05/15/15 [phone]

## 2015-05-09 NOTE — ED Provider Notes (Signed)
Patient initially seen and evaluated by Dr. Wilson Singer, signed out to me to evaluate results of urinalysis. Urinalysis shows no evidence of infection. Glucosuria is present consistent with known history of diabetes. Patient had been discharged by nursing prior to my being able to come back to discuss results with her.  Karen Fuel, MD [id number]

## 2015-05-09 NOTE — Discharge Instructions (Signed)

## 2015-05-09 NOTE — ED Notes (Signed)
Pt left with all belongings and was wheeled out of the treatment area.

## 2015-05-11 LAB — CULTURE, GROUP A STREP

## 2015-05-20 DIAGNOSIS — F341 Dysthymic disorder: Secondary | ICD-10-CM | POA: Diagnosis not present

## 2015-05-21 ENCOUNTER — Ambulatory Visit (INDEPENDENT_AMBULATORY_CARE_PROVIDER_SITE_OTHER): Payer: Commercial Managed Care - HMO | Admitting: Psychiatry

## 2015-05-21 ENCOUNTER — Encounter: Payer: Self-pay | Admitting: Psychiatry

## 2015-05-21 VITALS — BP 140/88 | HR 118 | Temp 97.2°F | Ht 64.0 in | Wt >= 6400 oz

## 2015-05-21 DIAGNOSIS — J4530 Mild persistent asthma, uncomplicated: Secondary | ICD-10-CM | POA: Diagnosis not present

## 2015-05-21 DIAGNOSIS — F5081 Binge eating disorder: Secondary | ICD-10-CM

## 2015-05-21 DIAGNOSIS — I159 Secondary hypertension, unspecified: Secondary | ICD-10-CM | POA: Diagnosis not present

## 2015-05-21 DIAGNOSIS — F329 Major depressive disorder, single episode, unspecified: Secondary | ICD-10-CM | POA: Insufficient documentation

## 2015-05-21 DIAGNOSIS — R51 Headache: Secondary | ICD-10-CM | POA: Diagnosis not present

## 2015-05-21 DIAGNOSIS — G43909 Migraine, unspecified, not intractable, without status migrainosus: Secondary | ICD-10-CM | POA: Insufficient documentation

## 2015-05-21 DIAGNOSIS — F50819 Binge eating disorder, unspecified: Secondary | ICD-10-CM

## 2015-05-21 DIAGNOSIS — M109 Gout, unspecified: Secondary | ICD-10-CM | POA: Insufficient documentation

## 2015-05-21 DIAGNOSIS — F508 Other eating disorders: Secondary | ICD-10-CM

## 2015-05-21 DIAGNOSIS — F331 Major depressive disorder, recurrent, moderate: Secondary | ICD-10-CM | POA: Diagnosis not present

## 2015-05-21 DIAGNOSIS — E079 Disorder of thyroid, unspecified: Secondary | ICD-10-CM | POA: Insufficient documentation

## 2015-05-21 DIAGNOSIS — B019 Varicella without complication: Secondary | ICD-10-CM | POA: Insufficient documentation

## 2015-05-21 DIAGNOSIS — E282 Polycystic ovarian syndrome: Secondary | ICD-10-CM | POA: Insufficient documentation

## 2015-05-21 DIAGNOSIS — Z3042 Encounter for surveillance of injectable contraceptive: Secondary | ICD-10-CM | POA: Diagnosis not present

## 2015-05-21 DIAGNOSIS — L309 Dermatitis, unspecified: Secondary | ICD-10-CM | POA: Insufficient documentation

## 2015-05-21 MED ORDER — ARIPIPRAZOLE 2 MG PO TABS: 2.0000 mg | ORAL_TABLET | ORAL | Status: DC

## 2015-05-21 MED ORDER — LISDEXAMFETAMINE DIMESYLATE 60 MG PO CAPS: 60.0000 mg | ORAL_CAPSULE | ORAL | Status: DC

## 2015-05-21 NOTE — Progress Notes (Addendum)
BH MD/PA/NP OP Progress Note  05/21/2015 1:42 PM Karen Lewis  MRN:  [id number]  Subjective:  Patient returns for follow-up for major depressive disorder, moderate recurrent in binge eating disorder. She reports today she's had multiple stressors. She states she's had a lumbar puncture that was difficult to obtain a week ago and she's had subsequent headaches and pain from that. She states also she's had a sinus infection. She states that also has been family stress and strain. She states she has ended the relationship with her most recent boyfriend. She states she has not had the Vyvanse in a while because her payment assistance card expired and she brings a paper for me to reapply for that.  He states she is largely stressed by all the above but feels like she is depressed. She commented her neurologist feels like she may need adjustments in her depression medicine. We discussed the issues of augmenting and patient indicated she's not had any augmentation with medication such as Abilify or Seroquel.  In regards to her binge eating she states that has been fairly controlled given that she has not had her Vyvanse. However she states she is able to discriminate between what she wants to eat. She discussed going to a Careers adviser with her father and come the conclusion she no longer had anything that she likes fair.  They've that her appetite has been good. She states that her sleep is only fair and sometimes she does take sleeping medication. She continues on her sertraline and uses her clonazepam sparingly and does not need any more of that medication today. Chief Complaint:  Chief Complaint    Follow-up; Medication Refill; Anxiety; Depression; Stress     Visit Diagnosis:     ICD-9-CM ICD-10-CM   1. Major depressive disorder, recurrent episode, moderate 296.32 F33.1   2. Binge eating disorder 307.50 F50.8     Past Medical History:  Past Medical History  Diagnosis Date  . Anginal pain    . Hypertension   . Dysrhythmia   . Asthma   . Sleep apnea   . Thyroid nodule bilateral  . Diabetes mellitus without complication   . Depression   . Anxiety   . GERD (gastroesophageal reflux disease)   . History of hiatal hernia   . Headache   . Neuromuscular disorder   . Fibromyalgia   . Arthritis   . Anemia   . Obesity     Past Surgical History  Procedure Laterality Date  . Laparoscopic partial gastrectomy    . Shoulder arthroscopy Right   . Joint replacement Left hip  . Carpal tunnel release Bilateral   . Diagnostic laparoscopy    . Appendectomy     Family History:  Family History  Problem Relation Age of Onset  . Anxiety disorder Mother   . Depression Mother   . Alcohol abuse Mother   . Diabetes Mother   . Hypertension Mother   . Kidney cancer Mother   . Sleep apnea Mother   . Alcohol abuse Father   . Anxiety disorder Father   . Depression Father   . Post-traumatic stress disorder Father   . Kidney failure Father   . COPD Father   . Diabetes Father   . Hypertension Father   . Sleep apnea Father   . Depression Brother   . Diabetes Brother   . Hypertension Brother   . Sleep apnea Brother    Social History:  Social History   Social History  .  Marital Status: Single    Spouse Name: N/A  . Number of Children: N/A  . Years of Education: N/A   Social History Main Topics  . Smoking status: Never Smoker   . Smokeless tobacco: Never Used  . Alcohol Use: No  . Drug Use: No  . Sexual Activity: Not Currently   Other Topics Concern  . None   Social History Narrative   Additional History:   Assessment:   Musculoskeletal: Strength & Muscle Tone: within normal limits Gait & Station: normal Patient leans: N/A  Psychiatric Specialty Exam: HPI  Review of Systems  Psychiatric/Behavioral: Positive for depression. Negative for suicidal ideas, hallucinations, memory loss and substance abuse. The patient has insomnia. The patient is not nervous/anxious.      Blood pressure 140/88, pulse 118, temperature 97.2 F (36.2 C), temperature source Tympanic, height 5\' 4"  (1.626 m), weight 409 lb 9.6 oz (185.793 kg), SpO2 95 %.Body mass index is 70.27 kg/(m^2).  General Appearance: Well Groomed  Eye Contact:  Good  Speech:  Normal Rate  Volume:  Normal  Mood:  Depressed  Affect:  Congruent  Thought Process:  Linear and Logical  Orientation:  Full (Time, Place, and Person)  Thought Content:  Negative  Suicidal Thoughts:  No  Homicidal Thoughts:  No  Memory:  Immediate;   Good Recent;   Good Remote;   Good  Judgement:  Good  Insight:  Good  Psychomotor Activity:  Negative  Concentration:  Good  Recall:  Good  Fund of Knowledge: Good  Language: Good  Akathisia:  Negative  Handed:  Right  AIMS (if indicated):  Normal, done 05/21/15  Assets:  Communication Skills Desire for Improvement  ADL's:  Intact  Cognition: WNL  Sleep:  fair   Is the patient at risk to self?  No. Has the patient been a risk to self in the past 6 months?  No. Has the patient been a risk to self within the distant past?  No. Is the patient a risk to others?  No. Has the patient been a risk to others in the past 6 months?  No. Has the patient been a risk to others within the distant past?  No.  Current Medications: Current Outpatient Prescriptions  Medication Sig Dispense Refill  . acetaZOLAMIDE (DIAMOX) 250 MG tablet Take 1 tablet (250 mg total) by mouth 2 (two) times daily. 30 tablet 0  . acyclovir (ZOVIRAX) 800 MG tablet Take 800 mg by mouth 2 (two) times daily.     Marland Kitchen albuterol (PROVENTIL HFA;VENTOLIN HFA) 108 (90 BASE) MCG/ACT inhaler Inhale 2 puffs into the lungs every 6 (six) hours as needed for wheezing or shortness of breath.    Marland Kitchen amitriptyline (ELAVIL) 100 MG tablet Take 100 mg by mouth at bedtime.     Marland Kitchen atorvastatin (LIPITOR) 40 MG tablet Take 40 mg by mouth daily at 6 PM.     . Butalbital-APAP-Caff-Cod (FIORICET/CODEINE) 50-300-40-30 MG CAPS Take 1 tablet  by mouth as needed (for headache).     . cefUROXime (CEFTIN) 500 MG tablet Take by mouth.    . cetirizine (ZYRTEC) 5 MG tablet Take 5 mg by mouth daily.     . clonazePAM (KLONOPIN) 0.5 MG tablet Take 0.5 mg by mouth 2 (two) times daily as needed for anxiety.     . ergocalciferol (VITAMIN D2) 50000 UNITS capsule Take 50,000 Units by mouth once a week.    . fluconazole (DIFLUCAN) 100 MG tablet Take by mouth.    Marland Kitchen  Fluticasone-Salmeterol (ADVAIR DISKUS) 500-50 MCG/DOSE AEPB Inhale 1 puff into the lungs 2 (two) times daily.     . Hydrocodone-Acetaminophen (VICODIN ES) 7.5-300 MG TABS Take 1 tablet by mouth as needed (for pain).     Marland Kitchen lisdexamfetamine (VYVANSE) 60 MG capsule Take 1 capsule (60 mg total) by mouth every morning. 30 capsule 0  . magnesium oxide (MAG-OX) 400 MG tablet Take 400 mg by mouth 2 (two) times daily.    . metFORMIN (GLUCOPHAGE) 1000 MG tablet Take 1,000 mg by mouth 2 (two) times daily with a meal.     . metoprolol (LOPRESSOR) 50 MG tablet Take 50 mg by mouth 2 (two) times daily.     . montelukast (SINGULAIR) 10 MG tablet Take 10 mg by mouth at bedtime.     . Multiple Vitamin (MULTIVITAMIN WITH MINERALS) TABS tablet Take 2 tablets by mouth daily.    Marland Kitchen omeprazole (PRILOSEC) 40 MG capsule Take 40 mg by mouth 2 (two) times daily.     Marland Kitchen oxyCODONE-acetaminophen (PERCOCET/ROXICET) 5-325 MG per tablet Take by mouth every 4 (four) hours as needed for severe pain.    . predniSONE (DELTASONE) 20 MG tablet Take 2 tablets the first and second day, then take one tablet a day for 2 more days. 6 tablet 0  . sertraline (ZOLOFT) 100 MG tablet TAKE 2 TABLETS EVERY DAY 180 tablet 0  . spironolactone (ALDACTONE) 25 MG tablet Take 12.5 mg by mouth 2 (two) times daily.     Marland Kitchen topiramate (TOPAMAX) 100 MG tablet Take 150 mg by mouth 2 (two) times daily.     . Tuberculin-Allergy Syringes (VANISHPOINT TUBERCULIN SYRINGE) 25G X 1" 1 ML MISC Use as directed.    . zolpidem (AMBIEN) 10 MG tablet Take 10 mg by  mouth at bedtime as needed for sleep.    . ARIPiprazole (ABILIFY) 2 MG tablet Take 1 tablet (2 mg total) by mouth every morning. 30 tablet 1  . fluconazole (DIFLUCAN) 150 MG tablet     . medroxyPROGESTERone (DEPO-PROVERA) 150 MG/ML injection     . Oxycodone HCl 10 MG TABS Take 1 tablet (10 mg total) by mouth every 4 (four) hours as needed. (Patient not taking: Reported on 05/21/2015) 16 tablet 0  . tiZANidine (ZANAFLEX) 4 MG tablet      No current facility-administered medications for this visit.    Medical Decision Making:  Established Problem, Worsening (2), Review of Medication Regimen & Side Effects (2) and Review of New Medication or Change in Dosage (2)  Treatment Plan Summary:Medication management and Plan Patient reports worsening depression in the context of numerous stressors discussed above. We will try some augmentation with Abilify. We'll start Abilify 2 mg in the morning. Risk and benefits of been discussed patient able consent. She will continue on her Vyvanse 60 mg daily. I've given her prescription for this today. She will continue on her sertraline 200 mg daily. She'll continue on her clonazepam 0.5 mg twice a day as needed. She will continue her Ambien 10 mg as needed. She'll follow up in 1 month. She's been encouraged call any questions or concerns prior to her next appointment.  Patient brought in her [facility] cares prescription application for this writer to complete. I've completed it and will give it to the Schaller to send in to the company.  Faith Rogue 05/21/2015, 1:42 PM

## 2015-05-22 ENCOUNTER — Telehealth: Payer: Self-pay | Admitting: Psychiatry

## 2015-05-23 NOTE — Telephone Encounter (Signed)
Patient indicates she spoke with her insurance company and the Abilify is too expensive for her. She requested an alternative. I discussed with her perhaps using some Seroquel. She requests if there are any samples available. I told I have samples of Seroquel XR. She will be given Seroquel XR with the instructions take 50 mg at bedtime for 1 week and then go to 150 mg at bedtime. She has follow-up with me already scheduled. The risk and benefits of been discussing patient's able to consent. AW

## 2015-05-23 NOTE — Telephone Encounter (Signed)
pt was called and told that sample and saving cards were at front desk ready for pick up

## 2015-05-26 ENCOUNTER — Other Ambulatory Visit: Payer: Self-pay

## 2015-05-26 NOTE — Telephone Encounter (Signed)
pt called states that she needs a refill on her klonopin .5mg  pt was last seen on 05-21-15 next appt  06-20-15. [place].  pt was also told that the [facility] assistance and support program was unable to be process because it was incomplete. Pt stated that she had the rest of the paperwork and that she hadn't sent it in yet. Pt was advised that if she completed and bring it back to the office that I would refax it to them.

## 2015-05-27 MED ORDER — CLONAZEPAM 0.5 MG PO TABS: 0.5000 mg | ORAL_TABLET | Freq: Two times a day (BID) | ORAL | Status: DC | PRN

## 2015-05-27 NOTE — Telephone Encounter (Signed)
Faxed rx - confirmed

## 2015-05-27 NOTE — Telephone Encounter (Signed)
left message that rx was faxed and confirmed.

## 2015-05-28 DIAGNOSIS — M797 Fibromyalgia: Secondary | ICD-10-CM | POA: Diagnosis not present

## 2015-05-28 DIAGNOSIS — G4733 Obstructive sleep apnea (adult) (pediatric): Secondary | ICD-10-CM | POA: Diagnosis not present

## 2015-05-28 DIAGNOSIS — F328 Other depressive episodes: Secondary | ICD-10-CM | POA: Diagnosis not present

## 2015-05-28 DIAGNOSIS — R51 Headache: Secondary | ICD-10-CM | POA: Diagnosis not present

## 2015-05-29 ENCOUNTER — Other Ambulatory Visit: Payer: Self-pay | Admitting: Psychiatry

## 2015-05-29 DIAGNOSIS — H6983 Other specified disorders of Eustachian tube, bilateral: Secondary | ICD-10-CM | POA: Diagnosis not present

## 2015-05-29 DIAGNOSIS — Z79899 Other long term (current) drug therapy: Secondary | ICD-10-CM | POA: Diagnosis not present

## 2015-05-30 ENCOUNTER — Telehealth: Payer: Self-pay | Admitting: Psychiatry

## 2015-05-30 LAB — LIPID PANEL W/O CHOL/HDL RATIO
CHOLESTEROL TOTAL: 179 mg/dL (ref 100–199)
HDL: 42 mg/dL (ref >39)
LDL CALC: 102 mg/dL — AB (ref 0–99)
TRIGLYCERIDES: 176 mg/dL — AB (ref 0–149)
VLDL CHOLESTEROL CAL: 35 mg/dL (ref 5–40)

## 2015-05-30 LAB — GLUCOSE, RANDOM: Glucose: 127 mg/dL — ABNORMAL HIGH (ref 65–99)

## 2015-05-30 LAB — PROLACTIN: PROLACTIN: 8.2 ng/mL (ref 4.8–23.3)

## 2015-05-30 NOTE — Telephone Encounter (Signed)
Received patient's lab results from 05/29/2015. Patient's total cholesterol was normal at 179. Her glycerides were elevated at 176 or LDL was slightly elevated at 102. Productive and was normal at 8.2 and glucose was elevated at 127 however patient is diabetic. These results were discussed with the patient. She can continue to follow-up with primary care regards to her diabetes and lipids.

## 2015-06-02 DIAGNOSIS — J301 Allergic rhinitis due to pollen: Secondary | ICD-10-CM | POA: Diagnosis not present

## 2015-06-02 DIAGNOSIS — H903 Sensorineural hearing loss, bilateral: Secondary | ICD-10-CM | POA: Diagnosis not present

## 2015-06-02 DIAGNOSIS — H6980 Other specified disorders of Eustachian tube, unspecified ear: Secondary | ICD-10-CM | POA: Diagnosis not present

## 2015-06-03 DIAGNOSIS — J4531 Mild persistent asthma with (acute) exacerbation: Secondary | ICD-10-CM | POA: Diagnosis not present

## 2015-06-03 DIAGNOSIS — G479 Sleep disorder, unspecified: Secondary | ICD-10-CM | POA: Diagnosis not present

## 2015-06-03 DIAGNOSIS — R05 Cough: Secondary | ICD-10-CM | POA: Diagnosis not present

## 2015-06-03 DIAGNOSIS — R0609 Other forms of dyspnea: Secondary | ICD-10-CM | POA: Diagnosis not present

## 2015-06-09 DIAGNOSIS — Z23 Encounter for immunization: Secondary | ICD-10-CM | POA: Diagnosis not present

## 2015-06-20 ENCOUNTER — Encounter: Payer: Self-pay | Admitting: Psychiatry

## 2015-06-20 ENCOUNTER — Ambulatory Visit (INDEPENDENT_AMBULATORY_CARE_PROVIDER_SITE_OTHER): Payer: Commercial Managed Care - HMO | Admitting: Psychiatry

## 2015-06-20 VITALS — BP 138/84 | HR 124 | Temp 97.6°F | Ht 64.0 in | Wt >= 6400 oz

## 2015-06-20 DIAGNOSIS — F5081 Binge eating disorder: Secondary | ICD-10-CM | POA: Diagnosis not present

## 2015-06-20 DIAGNOSIS — F411 Generalized anxiety disorder: Secondary | ICD-10-CM

## 2015-06-20 DIAGNOSIS — G43019 Migraine without aura, intractable, without status migrainosus: Secondary | ICD-10-CM | POA: Diagnosis not present

## 2015-06-20 DIAGNOSIS — F331 Major depressive disorder, recurrent, moderate: Secondary | ICD-10-CM

## 2015-06-20 MED ORDER — QUETIAPINE FUMARATE 100 MG PO TABS: 150.0000 mg | ORAL_TABLET | Freq: Every day | ORAL | Status: DC

## 2015-06-20 MED ORDER — QUETIAPINE FUMARATE ER 150 MG PO TB24: 150.0000 mg | ORAL_TABLET | Freq: Every day | ORAL | Status: DC

## 2015-06-20 MED ORDER — ZOLPIDEM TARTRATE 10 MG PO TABS: 10.0000 mg | ORAL_TABLET | Freq: Every evening | ORAL | Status: DC | PRN

## 2015-06-20 NOTE — Progress Notes (Signed)
BH MD/PA/NP OP Progress Note  06/20/2015 2:05 PM Karen Lewis  MRN:  [id number]  Subjective:  Patient returns for follow-up for major depressive disorder, moderate recurrent in binge eating disorder. Patient states her mood is better since we have been augmenting her antidepressant regimen with Seroquel. She states she feels brighter and is not as sad. She states that her appetite has increased but she relates this to not having her Vyvanse. Patient and this office of been working with her prescription assistance program to renew the coverage for her Vyvanse however there been some delays such as them requesting proof of income. Patient is aware of this and is in the process of working on it.  She states that her sleep has started become somewhat problematic and so she requested another prescription for Ambien. She states she is able to socialize and she has a friend that is living with her at this time. Patient is helping the friend who is pregnant. Chief Complaint: Mood is better Chief Complaint    Follow-up; Medication Refill     Visit Diagnosis:   No diagnosis found.  Past Medical History:  Past Medical History  Diagnosis Date  . Anginal pain ([place])   . Hypertension   . Dysrhythmia   . Asthma   . Sleep apnea   . Thyroid nodule bilateral  . Diabetes mellitus without complication ([place])   . Depression   . Anxiety   . GERD (gastroesophageal reflux disease)   . History of hiatal hernia   . Headache   . Neuromuscular disorder ([place])   . Fibromyalgia   . Arthritis   . Anemia   . Obesity     Past Surgical History  Procedure Laterality Date  . Laparoscopic partial gastrectomy    . Shoulder arthroscopy Right   . Joint replacement Left hip  . Carpal tunnel release Bilateral   . Diagnostic laparoscopy    . Appendectomy     Family History:  Family History  Problem Relation Age of Onset  . Anxiety disorder Mother   . Depression Mother   . Alcohol abuse Mother   . Diabetes Mother    . Hypertension Mother   . Kidney cancer Mother   . Sleep apnea Mother   . Alcohol abuse Father   . Anxiety disorder Father   . Depression Father   . Post-traumatic stress disorder Father   . Kidney failure Father   . COPD Father   . Diabetes Father   . Hypertension Father   . Sleep apnea Father   . Depression Brother   . Diabetes Brother   . Hypertension Brother   . Sleep apnea Brother    Social History:  Social History   Social History  . Marital Status: Single    Spouse Name: N/A  . Number of Children: N/A  . Years of Education: N/A   Social History Main Topics  . Smoking status: Never Smoker   . Smokeless tobacco: Never Used  . Alcohol Use: No  . Drug Use: No  . Sexual Activity: Not Currently   Other Topics Concern  . None   Social History Narrative   Additional History:   Assessment:   Musculoskeletal: Strength & Muscle Tone: within normal limits Gait & Station: normal Patient leans: N/A  Psychiatric Specialty Exam: HPI  Review of Systems  Psychiatric/Behavioral: Positive for depression. Negative for suicidal ideas, hallucinations, memory loss and substance abuse. The patient has insomnia. The patient is not nervous/anxious.   All  other systems reviewed and are negative.   Blood pressure 138/84, pulse 124, temperature 97.6 F (36.4 C), temperature source Tympanic, height 5\' 4"  (1.626 m), weight 425 lb 9.6 oz (193.051 kg), SpO2 97 %.Body mass index is 73.02 kg/(m^2).  General Appearance: Well Groomed  Eye Contact:  Good  Speech:  Normal Rate  Volume:  Normal  Mood:  Depressed  Affect:  Congruent  Thought Process:  Linear and Logical  Orientation:  Full (Time, Place, and Person)  Thought Content:  Negative  Suicidal Thoughts:  No  Homicidal Thoughts:  No  Memory:  Immediate;   Good Recent;   Good Remote;   Good  Judgement:  Good  Insight:  Good  Psychomotor Activity:  Negative  Concentration:  Good  Recall:  Good  Fund of Knowledge: Good   Language: Good  Akathisia:  Negative  Handed:  Right  AIMS (if indicated):  Normal, done 05/21/15  Assets:  Communication Skills Desire for Improvement  ADL's:  Intact  Cognition: WNL  Sleep:  fair   Is the patient at risk to self?  No. Has the patient been a risk to self in the past 6 months?  No. Has the patient been a risk to self within the distant past?  No. Is the patient a risk to others?  No. Has the patient been a risk to others in the past 6 months?  No. Has the patient been a risk to others within the distant past?  No.  Current Medications: Current Outpatient Prescriptions  Medication Sig Dispense Refill  . acyclovir (ZOVIRAX) 800 MG tablet Take 800 mg by mouth 2 (two) times daily.     Marland Kitchen albuterol (PROVENTIL HFA;VENTOLIN HFA) 108 (90 BASE) MCG/ACT inhaler Inhale 2 puffs into the lungs every 6 (six) hours as needed for wheezing or shortness of breath.    Marland Kitchen amitriptyline (ELAVIL) 100 MG tablet Take 100 mg by mouth at bedtime.     Marland Kitchen atorvastatin (LIPITOR) 40 MG tablet Take 40 mg by mouth daily at 6 PM.     . azelastine (ASTELIN) 0.1 % nasal spray     . Butalbital-APAP-Caff-Cod (FIORICET/CODEINE) 50-300-40-30 MG CAPS Take 1 tablet by mouth as needed (for headache).     . cetirizine (ZYRTEC) 5 MG tablet Take 5 mg by mouth daily.     . clonazePAM (KLONOPIN) 0.5 MG tablet Take 1 tablet (0.5 mg total) by mouth 2 (two) times daily as needed for anxiety. 60 tablet 1  . ergocalciferol (VITAMIN D2) 50000 UNITS capsule Take 50,000 Units by mouth once a week.    . fluconazole (DIFLUCAN) 150 MG tablet     . Fluticasone-Salmeterol (ADVAIR DISKUS) 500-50 MCG/DOSE AEPB Inhale 1 puff into the lungs 2 (two) times daily.     Marland Kitchen gabapentin (NEURONTIN) 600 MG tablet     . gabapentin (NEURONTIN) 800 MG tablet     . Hydrocodone-Acetaminophen (VICODIN ES) 7.5-300 MG TABS Take 1 tablet by mouth as needed (for pain).     Marland Kitchen lisdexamfetamine (VYVANSE) 60 MG capsule Take 1 capsule (60 mg total) by  mouth every morning. 30 capsule 0  . magnesium oxide (MAG-OX) 400 MG tablet Take 400 mg by mouth 2 (two) times daily.    . medroxyPROGESTERone (DEPO-PROVERA) 150 MG/ML injection     . metFORMIN (GLUCOPHAGE) 1000 MG tablet Take 1,000 mg by mouth 2 (two) times daily with a meal.     . metoprolol (LOPRESSOR) 50 MG tablet Take 50 mg by mouth 2 (two)  times daily.     . montelukast (SINGULAIR) 10 MG tablet Take 10 mg by mouth at bedtime.     . Multiple Vitamin (MULTIVITAMIN WITH MINERALS) TABS tablet Take 2 tablets by mouth daily.    Marland Kitchen omeprazole (PRILOSEC) 40 MG capsule Take 40 mg by mouth 2 (two) times daily.     Marland Kitchen oxyCODONE-acetaminophen (PERCOCET/ROXICET) 5-325 MG per tablet Take by mouth every 4 (four) hours as needed for severe pain.    Marland Kitchen sertraline (ZOLOFT) 100 MG tablet TAKE 2 TABLETS EVERY DAY 180 tablet 0  . SPIRIVA HANDIHALER 18 MCG inhalation capsule     . spironolactone (ALDACTONE) 25 MG tablet Take 12.5 mg by mouth 2 (two) times daily.     Marland Kitchen tiZANidine (ZANAFLEX) 4 MG tablet     . topiramate (TOPAMAX) 100 MG tablet Take 150 mg by mouth 2 (two) times daily.     . TRUE METRIX BLOOD GLUCOSE TEST test strip     . Tuberculin-Allergy Syringes (VANISHPOINT TUBERCULIN SYRINGE) 25G X 1" 1 ML MISC Use as directed.    . zolpidem (AMBIEN) 10 MG tablet Take 1 tablet (10 mg total) by mouth at bedtime as needed for sleep. 30 tablet 1  . QUEtiapine (SEROQUEL) 100 MG tablet Take 1.5 tablets (150 mg total) by mouth at bedtime. 45 tablet 1  . QUEtiapine Fumarate (SEROQUEL XR) 150 MG 24 hr tablet Take 1 tablet (150 mg total) by mouth at bedtime. 30 tablet 1   No current facility-administered medications for this visit.    Medical Decision Making:  Established Problem, Worsening (2), Review of Medication Regimen & Side Effects (2) and Review of New Medication or Change in Dosage (2)  Treatment Plan Summary:Medication management and Plan   Major depressive disorder, recurrent moderate-we will continue  the patient sertraline at 200 mg daily. She will continue on the Seroquel 150 mg at bedtime. She was provided with samples of the XR. She reports good results. However she states that her insurance may only cover the regular release. Thus I provided her with 2 prescriptions and she is going to see if she can get the X are and if not she will get the regular release.    She will continue on her Vyvanse 60 mg daily. I've given her prescription for this today. She will continue on her sertraline 200 mg daily.    GAD-She'll continue on her clonazepam 0.5 mg twice a day as needed.    Insomnia- She will continue her Ambien 10 mg as needed.    Binge Eating Disorder-Patient has a prescription for her Vyvanse 60 mg daily. But as discussed above she is awaiting renewal of her prescription assistance program.  She'll follow up in 1 month. She's been encouraged call any questions or concerns prior to her next appointment.  Faith Rogue 06/20/2015, 2:05 PM

## 2015-06-27 ENCOUNTER — Encounter: Payer: Self-pay | Admitting: *Deleted

## 2015-06-27 ENCOUNTER — Emergency Department
Admission: EM | Admit: 2015-06-27 | Discharge: 2015-06-27 | Disposition: A | Discharge status: Home / Self Care | Payer: Commercial Managed Care - HMO | Attending: Emergency Medicine | Admitting: Emergency Medicine

## 2015-06-27 DIAGNOSIS — Z7952 Long term (current) use of systemic steroids: Secondary | ICD-10-CM | POA: Insufficient documentation

## 2015-06-27 DIAGNOSIS — I10 Essential (primary) hypertension: Secondary | ICD-10-CM | POA: Insufficient documentation

## 2015-06-27 DIAGNOSIS — Z7951 Long term (current) use of inhaled steroids: Secondary | ICD-10-CM | POA: Insufficient documentation

## 2015-06-27 DIAGNOSIS — G43019 Migraine without aura, intractable, without status migrainosus: Secondary | ICD-10-CM | POA: Diagnosis not present

## 2015-06-27 DIAGNOSIS — E119 Type 2 diabetes mellitus without complications: Secondary | ICD-10-CM | POA: Insufficient documentation

## 2015-06-27 DIAGNOSIS — G8929 Other chronic pain: Secondary | ICD-10-CM | POA: Insufficient documentation

## 2015-06-27 DIAGNOSIS — Z79899 Other long term (current) drug therapy: Secondary | ICD-10-CM | POA: Insufficient documentation

## 2015-06-27 DIAGNOSIS — R42 Dizziness and giddiness: Secondary | ICD-10-CM | POA: Insufficient documentation

## 2015-06-27 DIAGNOSIS — R110 Nausea: Secondary | ICD-10-CM | POA: Insufficient documentation

## 2015-06-27 MED ORDER — MECLIZINE HCL 25 MG PO TABS
25.0000 mg | ORAL_TABLET | Freq: Once | ORAL | Status: AC
Start: 2015-06-27 — End: 2015-06-27
  Administered 2015-06-27: 25 mg via ORAL
  Filled 2015-06-27: qty 1

## 2015-06-27 MED ORDER — MECLIZINE HCL 25 MG PO TABS: 25.0000 mg | ORAL_TABLET | Freq: Three times a day (TID) | ORAL | Status: DC | PRN

## 2015-06-27 MED ORDER — MECLIZINE HCL 25 MG PO TABS
25.0000 mg | ORAL_TABLET | Freq: Once | ORAL | Status: AC
  Administered 2015-06-27: 25 mg via ORAL
  Filled 2015-06-27: qty 1

## 2015-06-27 NOTE — Discharge Instructions (Signed)
We believe your symptoms were caused by benign vertigo.  Please read through the included information and take any prescribed medication(s).  Follow up with your doctor as listed above.  If you develop any new or worsening symptoms that concern you, including but not limited to persistent dizziness/vertigo, numbness or weakness in your arms or legs, altered mental status, persistent vomiting, or fever greater than 101, please return immediately to the Emergency Department.   Benign Positional Vertigo Vertigo is the feeling that you or your surroundings are moving when they are not. Benign positional vertigo is the most common form of vertigo. The cause of this condition is not serious (is benign). This condition is triggered by certain movements and positions (is positional). This condition can be dangerous if it occurs while you are doing something that could endanger you or others, such as driving.  CAUSES In many cases, the cause of this condition is not known. It may be caused by a disturbance in an area of the inner ear that helps your brain to sense movement and balance. This disturbance can be caused by a viral infection (labyrinthitis), head injury, or repetitive motion. RISK FACTORS This condition is more likely to develop in:  Women.  People who are 20 years of age or older. SYMPTOMS Symptoms of this condition usually happen when you move your head or your eyes in different directions. Symptoms may start suddenly, and they usually last for less than a minute. Symptoms may include:  Loss of balance and falling.  Feeling like you are spinning or moving.  Feeling like your surroundings are spinning or moving.  Nausea and vomiting.  Blurred vision.  Dizziness.  Involuntary eye movement (nystagmus). Symptoms can be mild and cause only slight annoyance, or they can be severe and interfere with daily life. Episodes of benign positional vertigo may return (recur) over time, and they  may be triggered by certain movements. Symptoms may improve over time. DIAGNOSIS This condition is usually diagnosed by medical history and a physical exam of the head, neck, and ears. You may be referred to a health care provider who specializes in ear, nose, and throat (ENT) problems (otolaryngologist) or a provider who specializes in disorders of the nervous system (neurologist). You may have additional testing, including:  MRI.  A CT scan.  Eye movement tests. Your health care provider may ask you to change positions quickly while he or she watches you for symptoms of benign positional vertigo, such as nystagmus. Eye movement may be tested with an electronystagmogram (ENG), caloric stimulation, the Dix-Hallpike test, or the roll test.  An electroencephalogram (EEG). This records electrical activity in your brain.  Hearing tests. TREATMENT Usually, your health care provider will treat this by moving your head in specific positions to adjust your inner ear back to normal. Surgery may be needed in severe cases, but this is rare. In some cases, benign positional vertigo may resolve on its own in 2-4 weeks. HOME CARE INSTRUCTIONS Safety  Move slowly.Avoid sudden body or head movements.  Avoid driving.  Avoid operating heavy machinery.  Avoid doing any tasks that would be dangerous to you or others if a vertigo episode would occur.  If you have trouble walking or keeping your balance, try using a cane for stability. If you feel dizzy or unstable, sit down right away.  Return to your normal activities as told by your health care provider. Ask your health care provider what activities are safe for you. General Instructions  Take  over-the-counter and prescription medicines only as told by your health care provider.  Avoid certain positions or movements as told by your health care provider.  Drink enough fluid to keep your urine clear or pale yellow.  Keep all follow-up visits as told  by your health care provider. This is important. SEEK MEDICAL CARE IF:  You have a fever.  Your condition gets worse or you develop new symptoms.  Your family or friends notice any behavioral changes.  Your nausea or vomiting gets worse.  You have numbness or a "pins and needles" sensation. SEEK IMMEDIATE MEDICAL CARE IF:  You have difficulty speaking or moving.  You are always dizzy.  You faint.  You develop severe headaches.  You have weakness in your legs or arms.  You have changes in your hearing or vision.  You develop a stiff neck.  You develop sensitivity to light.   This information is not intended to replace advice given to you by your health care provider. Make sure you discuss any questions you have with your health care provider.   Document Released: 06/07/2006 Document Revised: 05/21/2015 Document Reviewed: 12/23/2014 Elsevier Interactive Patient Education [employer].

## 2015-06-27 NOTE — ED Provider Notes (Signed)
[facility] Emergency Department Provider Note  ____________________________________________  Time seen: Approximately 5:15 PM  I have reviewed the triage vital signs and the nursing notes.   HISTORY  Chief Complaint Dizziness    HPI Karen Lewis is a 44 y.o. female who presents directly from the neurology clinic where she saw Dr. Manuella Ghazi today.  Dr. Manuella Ghazi called me to give me a report.  The patient is morbidly obese and he has been treating her for chronic pain with sphenopalatine injections.  This involves the patient hyperextending her neck and getting a noninvasive injection in the nose.  This is her second of 3 treatments.  She states that the first time made her dizzy, but today she has had severe and persistent vertigo.  She did report that she felt a "pop" in her ears while she was getting the treatment.  She states it is worse when she looks to the left but it is present at rest with a feeling of the room spinning. Dr. Manuella Ghazi felt that he was not able to manage her symptoms in the clinic and sent her to the emergency department for evaluation.  She denies headache, neck pain, fever, chills, chest pain, shortness of breath.  She has nausea but no vomiting.  She states it feels similar but more severe than prior episodes of vertigo.  Movement makes it worse and rest makes it slightly better.    Past Medical History  Diagnosis Date  . Anginal pain ([place])   . Hypertension   . Dysrhythmia   . Asthma   . Sleep apnea   . Thyroid nodule bilateral  . Diabetes mellitus without complication ([place])   . Depression   . Anxiety   . GERD (gastroesophageal reflux disease)   . History of hiatal hernia   . Headache   . Neuromuscular disorder ([place])   . Fibromyalgia   . Arthritis   . Anemia   . Obesity     Patient Active Problem List   Diagnosis Date Noted  . Headache disorder 05/28/2015  . Allergic state 05/21/2015  . Chicken pox 05/21/2015  . Clinical depression  05/21/2015  . Dermatitis, eczematoid 05/21/2015  . Gout 05/21/2015  . Headache, migraine 05/21/2015  . Bilateral polycystic ovarian syndrome 05/21/2015  . Disease of thyroid gland 05/21/2015  . Disease with a predominantly sexual mode of transmission 05/21/2015  . Obstructive apnea 04/30/2015  . History of asthma 03/10/2015  . Aggrieved 03/10/2015  . Binge eating disorder 03/10/2015  . H/O diabetes mellitus 03/10/2015  . H/O gastrointestinal disease 03/10/2015  . Fibromyalgia 03/10/2015  . Anxiety, generalized 03/10/2015  . H/O: HTN (hypertension) 03/10/2015  . Insomnia, persistent 03/10/2015  . Depression, major, recurrent, moderate ([place]) 03/10/2015  . History of migraine headaches 03/10/2015  . H/O: obesity 03/10/2015  . H/O: osteoarthritis 03/10/2015  . H/O disease 03/10/2015  . H/O thyroid disease 03/10/2015  . Cephalalgia 07/25/2014  . Avitaminosis D 04/30/2014  . HLD (hyperlipidemia) 04/25/2014  . BP (high blood pressure) 04/11/2014  . Diabetes mellitus, type 2 ([place]) 04/11/2014  . Type 2 diabetes mellitus ([place]) 04/11/2014  . Airway hyperreactivity 03/19/2014  . Multinodular goiter 10/09/2013  . H/O surgical procedure 12/05/2012  . Diabetes ([place]) 12/05/2012  . Combined fat and carbohydrate induced hyperlipemia 12/05/2012  . Extreme obesity ([place]) 12/05/2012  . Apnea, sleep 12/05/2012  . History of surgical procedure 12/05/2012  . Diabetes mellitus ([place]) 12/05/2012  . Morbid obesity ([place]) 12/05/2012    Past Surgical History  Procedure Laterality Date  . Laparoscopic partial gastrectomy    . Shoulder arthroscopy Right   . Joint replacement Left hip  . Carpal tunnel release Bilateral   . Diagnostic laparoscopy    . Appendectomy      Current Outpatient Rx  Name  Route  Sig  Dispense  Refill  . acyclovir (ZOVIRAX) 800 MG tablet   Oral   Take 800 mg by mouth 2 (two) times daily.          Marland Kitchen albuterol (PROVENTIL HFA;VENTOLIN HFA) 108 (90 BASE) MCG/ACT inhaler    Inhalation   Inhale 2 puffs into the lungs every 6 (six) hours as needed for wheezing or shortness of breath.         Marland Kitchen amitriptyline (ELAVIL) 100 MG tablet   Oral   Take 100 mg by mouth at bedtime.          Marland Kitchen atorvastatin (LIPITOR) 40 MG tablet   Oral   Take 40 mg by mouth daily at 6 PM.          . azelastine (ASTELIN) 0.1 % nasal spray               . Butalbital-APAP-Caff-Cod (FIORICET/CODEINE) 50-300-40-30 MG CAPS   Oral   Take 1 tablet by mouth as needed (for headache).          . cetirizine (ZYRTEC) 5 MG tablet   Oral   Take 5 mg by mouth daily.          . clonazePAM (KLONOPIN) 0.5 MG tablet   Oral   Take 1 tablet (0.5 mg total) by mouth 2 (two) times daily as needed for anxiety.   60 tablet   1   . ergocalciferol (VITAMIN D2) 50000 UNITS capsule   Oral   Take 50,000 Units by mouth once a week.         . fluconazole (DIFLUCAN) 150 MG tablet               . Fluticasone-Salmeterol (ADVAIR DISKUS) 500-50 MCG/DOSE AEPB   Inhalation   Inhale 1 puff into the lungs 2 (two) times daily.          Marland Kitchen gabapentin (NEURONTIN) 600 MG tablet               . gabapentin (NEURONTIN) 800 MG tablet               . Hydrocodone-Acetaminophen (VICODIN ES) 7.5-300 MG TABS   Oral   Take 1 tablet by mouth as needed (for pain).          Marland Kitchen lisdexamfetamine (VYVANSE) 60 MG capsule   Oral   Take 1 capsule (60 mg total) by mouth every morning.   30 capsule   0   . magnesium oxide (MAG-OX) 400 MG tablet   Oral   Take 400 mg by mouth 2 (two) times daily.         . meclizine (ANTIVERT) 25 MG tablet   Oral   Take 1 tablet (25 mg total) by mouth 3 (three) times daily as needed for dizziness.   30 tablet   0   . medroxyPROGESTERone (DEPO-PROVERA) 150 MG/ML injection               . metFORMIN (GLUCOPHAGE) 1000 MG tablet   Oral   Take 1,000 mg by mouth 2 (two) times daily with a meal.          . metoprolol (LOPRESSOR) 50 MG tablet  Oral    Take 50 mg by mouth 2 (two) times daily.          . montelukast (SINGULAIR) 10 MG tablet   Oral   Take 10 mg by mouth at bedtime.          . Multiple Vitamin (MULTIVITAMIN WITH MINERALS) TABS tablet   Oral   Take 2 tablets by mouth daily.         Marland Kitchen omeprazole (PRILOSEC) 40 MG capsule   Oral   Take 40 mg by mouth 2 (two) times daily.          Marland Kitchen oxyCODONE-acetaminophen (PERCOCET/ROXICET) 5-325 MG per tablet   Oral   Take by mouth every 4 (four) hours as needed for severe pain.         Marland Kitchen QUEtiapine (SEROQUEL) 100 MG tablet   Oral   Take 1.5 tablets (150 mg total) by mouth at bedtime.   45 tablet   1   . QUEtiapine Fumarate (SEROQUEL XR) 150 MG 24 hr tablet   Oral   Take 1 tablet (150 mg total) by mouth at bedtime.   30 tablet   1   . sertraline (ZOLOFT) 100 MG tablet      TAKE 2 TABLETS EVERY DAY   180 tablet   0   . SPIRIVA HANDIHALER 18 MCG inhalation capsule                 Dispense as written.   Marland Kitchen spironolactone (ALDACTONE) 25 MG tablet   Oral   Take 12.5 mg by mouth 2 (two) times daily.          Marland Kitchen tiZANidine (ZANAFLEX) 4 MG tablet               . topiramate (TOPAMAX) 100 MG tablet   Oral   Take 150 mg by mouth 2 (two) times daily.          . TRUE METRIX BLOOD GLUCOSE TEST test strip                 Dispense as written.   . zolpidem (AMBIEN) 10 MG tablet   Oral   Take 1 tablet (10 mg total) by mouth at bedtime as needed for sleep.   30 tablet   1     Allergies Bactrim; Ciprofloxacin; Clindamycin; Sulfa antibiotics; and Nsaids  Family History  Problem Relation Age of Onset  . Anxiety disorder Mother   . Depression Mother   . Alcohol abuse Mother   . Diabetes Mother   . Hypertension Mother   . Kidney cancer Mother   . Sleep apnea Mother   . Alcohol abuse Father   . Anxiety disorder Father   . Depression Father   . Post-traumatic stress disorder Father   . Kidney failure Father   . COPD Father   . Diabetes Father    . Hypertension Father   . Sleep apnea Father   . Depression Brother   . Diabetes Brother   . Hypertension Brother   . Sleep apnea Brother     Social History Social History  Substance Use Topics  . Smoking status: Never Smoker   . Smokeless tobacco: Never Used  . Alcohol Use: No    Review of Systems Constitutional: No fever/chills Eyes: No visual changes, but some photophobia. ENT: No sore throat. Cardiovascular: Denies chest pain. Respiratory: Denies shortness of breath. Gastrointestinal: No abdominal pain.  Nausea, no vomiting.  No diarrhea.  No constipation. Genitourinary: Negative  for dysuria. Musculoskeletal: Negative for back pain. Skin: Negative for rash. Neurological: Severe vertigo/room spinning, unable to ambulate as result.  10-point ROS otherwise negative.  ____________________________________________   PHYSICAL EXAM:  VITAL SIGNS: ED Triage Vitals  Enc Vitals Group     BP 06/27/15 1639 135/78 mmHg     Pulse Rate 06/27/15 1639 93     Resp 06/27/15 1639 18     Temp 06/27/15 1639 98.5 F (36.9 C)     Temp Source 06/27/15 1639 Oral     SpO2 06/27/15 1639 98 %     Weight 06/27/15 1639 420 lb (190.511 kg)     Height 06/27/15 1639 5\' 4"  (1.626 m)     Head Cir --      Peak Flow --      Pain Score 06/27/15 1658 6     Pain Loc --      Pain Edu? --      Excl. in [place]? --     Constitutional: Alert and oriented. Well appearing and in no acute distress. Eyes: Conjunctivae are normal. PERRL. EOMI. Head: Atraumatic. Nose: No congestion/rhinnorhea. Mouth/Throat: Mucous membranes are moist.  Oropharynx non-erythematous. Neck: No stridor.  No cervical spine tenderness to palpation.  No carotid bruits.  No meningismus. Cardiovascular: Normal rate, regular rhythm. Grossly normal heart sounds.  Good peripheral circulation. Respiratory: Normal respiratory effort.  No retractions. Lungs CTAB. Gastrointestinal: Soft and nontender. No distention. No abdominal bruits.  No CVA tenderness. Musculoskeletal: No lower extremity tenderness nor edema.  No joint effusions. Neurologic:  Normal speech and language. No gross focal neurologic deficits are appreciated.  Skin:  Skin is warm, dry and intact. No rash noted. Psychiatric: Mood and affect are normal. Speech and behavior are normal.  ____________________________________________   LABS (all labs ordered are listed, but only abnormal results are displayed)  Labs Reviewed - No data to display ____________________________________________  EKG  ED ECG REPORT I, Lonzell Dorris, the attending physician, personally viewed and interpreted this ECG.  Date: 06/27/2015 EKG Time: 16:45 Rate: 87 Rhythm: normal sinus rhythm QRS Axis: normal Intervals: normal ST/T Wave abnormalities: Inverted T-wave in lead 3, otherwise unremarkable Conduction Disutrbances: none Narrative Interpretation: unremarkable  ____________________________________________  RADIOLOGY   No results found.  ____________________________________________   PROCEDURES  Procedure(s) performed: None  Critical Care performed: No ____________________________________________   INITIAL IMPRESSION / ASSESSMENT AND PLAN / ED COURSE  Pertinent labs & imaging results that were available during my care of the patient were reviewed by me and considered in my medical decision making (see chart for details).  The patient's signs, symptoms, and history of present illness are all consistent with benign positional vertigo.  The patient's size (approximately 420 pounds) is not conducive to an Epley maneuver, and evidence is sparse at best to suggest that this maneuver is frequently successful with relatively inexperienced providers in the setting of the emergency department.  I discussed all this with the patient and we agreed that medication management is the best course.  There is nothing at this point to suggest the patient has an acute vascular or  intracerebral abnormality.  We will proceed with meclizine 25 mg by mouth and reassess.  ----------------------------------------- 6:24 PM on 06/27/2015 -----------------------------------------  The patient states she feels much better.  Although she still is somewhat symptomatic she is tolerating by mouth and looking around and moving her head.  She would like to try to ambulate and move around little bit more, but she thinks she will be  able to go home.  Nothing to suggest central cause of vertigo.  Improved after 2 doses of meclizine.  ____________________________________________  FINAL CLINICAL IMPRESSION(S) / ED DIAGNOSES  Final diagnoses:  Vertigo      NEW MEDICATIONS STARTED DURING THIS VISIT:  Discharge Medication List as of 06/27/2015  7:01 PM    START taking these medications   Details  meclizine (ANTIVERT) 25 MG tablet Take 1 tablet (25 mg total) by mouth 3 (three) times daily as needed for dizziness., Starting 06/27/2015, Until Discontinued, Print         Hinda Kehr, MD 06/29/15 [phone]

## 2015-06-27 NOTE — ED Notes (Signed)
Pt keeps her eyes closed most of the time. States the "lights bother her eyes". Continued complaint of vertigo, light nausea. Encouraged her to keep eyes open as much as possible to orient herself to her surroundings.

## 2015-06-27 NOTE — ED Notes (Addendum)
Pt reports that she became dizzy during a procedure at [facility] this afternoon where she had medication injected through her nose, states that her ears 'cracked' when she had it done.  Pt took a metoprolol during the episode.  Dr Brigitte Pulse at [facility] wants pt evaluated for vertigo.

## 2015-07-01 DIAGNOSIS — J45909 Unspecified asthma, uncomplicated: Secondary | ICD-10-CM | POA: Diagnosis not present

## 2015-07-01 DIAGNOSIS — I159 Secondary hypertension, unspecified: Secondary | ICD-10-CM | POA: Diagnosis not present

## 2015-07-01 DIAGNOSIS — E7800 Pure hypercholesterolemia, unspecified: Secondary | ICD-10-CM | POA: Diagnosis not present

## 2015-07-01 DIAGNOSIS — E1142 Type 2 diabetes mellitus with diabetic polyneuropathy: Secondary | ICD-10-CM | POA: Diagnosis not present

## 2015-07-01 DIAGNOSIS — B370 Candidal stomatitis: Secondary | ICD-10-CM | POA: Diagnosis not present

## 2015-07-01 DIAGNOSIS — I10 Essential (primary) hypertension: Secondary | ICD-10-CM | POA: Diagnosis not present

## 2015-07-01 DIAGNOSIS — Z79899 Other long term (current) drug therapy: Secondary | ICD-10-CM | POA: Diagnosis not present

## 2015-07-01 DIAGNOSIS — H8110 Benign paroxysmal vertigo, unspecified ear: Secondary | ICD-10-CM | POA: Diagnosis not present

## 2015-07-01 DIAGNOSIS — J449 Chronic obstructive pulmonary disease, unspecified: Secondary | ICD-10-CM | POA: Diagnosis not present

## 2015-07-03 DIAGNOSIS — F341 Dysthymic disorder: Secondary | ICD-10-CM | POA: Diagnosis not present

## 2015-07-07 ENCOUNTER — Encounter: Payer: Commercial Managed Care - HMO | Admitting: Pain Medicine

## 2015-07-09 DIAGNOSIS — Z0000 Encounter for general adult medical examination without abnormal findings: Secondary | ICD-10-CM | POA: Diagnosis not present

## 2015-07-09 DIAGNOSIS — E559 Vitamin D deficiency, unspecified: Secondary | ICD-10-CM | POA: Diagnosis not present

## 2015-07-09 DIAGNOSIS — E782 Mixed hyperlipidemia: Secondary | ICD-10-CM | POA: Diagnosis not present

## 2015-07-09 DIAGNOSIS — E1142 Type 2 diabetes mellitus with diabetic polyneuropathy: Secondary | ICD-10-CM | POA: Diagnosis not present

## 2015-07-09 DIAGNOSIS — E042 Nontoxic multinodular goiter: Secondary | ICD-10-CM | POA: Diagnosis not present

## 2015-07-09 DIAGNOSIS — I10 Essential (primary) hypertension: Secondary | ICD-10-CM | POA: Diagnosis not present

## 2015-07-09 DIAGNOSIS — J4531 Mild persistent asthma with (acute) exacerbation: Secondary | ICD-10-CM | POA: Diagnosis not present

## 2015-07-17 DIAGNOSIS — I158 Other secondary hypertension: Secondary | ICD-10-CM | POA: Diagnosis not present

## 2015-07-17 DIAGNOSIS — E782 Mixed hyperlipidemia: Secondary | ICD-10-CM | POA: Diagnosis not present

## 2015-07-17 DIAGNOSIS — E7800 Pure hypercholesterolemia, unspecified: Secondary | ICD-10-CM | POA: Diagnosis not present

## 2015-07-17 DIAGNOSIS — E1142 Type 2 diabetes mellitus with diabetic polyneuropathy: Secondary | ICD-10-CM | POA: Diagnosis not present

## 2015-07-17 DIAGNOSIS — I159 Secondary hypertension, unspecified: Secondary | ICD-10-CM | POA: Diagnosis not present

## 2015-07-17 DIAGNOSIS — R079 Chest pain, unspecified: Secondary | ICD-10-CM | POA: Diagnosis not present

## 2015-07-17 DIAGNOSIS — I10 Essential (primary) hypertension: Secondary | ICD-10-CM | POA: Diagnosis not present

## 2015-07-18 ENCOUNTER — Ambulatory Visit: Payer: Self-pay | Admitting: Psychiatry

## 2015-07-28 DIAGNOSIS — M79675 Pain in left toe(s): Secondary | ICD-10-CM | POA: Diagnosis not present

## 2015-07-28 DIAGNOSIS — L600 Ingrowing nail: Secondary | ICD-10-CM | POA: Diagnosis not present

## 2015-07-28 DIAGNOSIS — M79674 Pain in right toe(s): Secondary | ICD-10-CM | POA: Diagnosis not present

## 2015-07-29 ENCOUNTER — Encounter: Payer: Self-pay | Admitting: Pain Medicine

## 2015-07-29 ENCOUNTER — Other Ambulatory Visit: Payer: Self-pay | Admitting: Pain Medicine

## 2015-07-29 ENCOUNTER — Ambulatory Visit: Payer: Commercial Managed Care - HMO | Attending: Pain Medicine | Admitting: Pain Medicine

## 2015-07-29 VITALS — BP 114/82 | HR 107 | Temp 99.8°F | Resp 18 | Ht 64.0 in | Wt >= 6400 oz

## 2015-07-29 DIAGNOSIS — M545 Low back pain, unspecified: Secondary | ICD-10-CM | POA: Insufficient documentation

## 2015-07-29 DIAGNOSIS — M4316 Spondylolisthesis, lumbar region: Secondary | ICD-10-CM | POA: Insufficient documentation

## 2015-07-29 DIAGNOSIS — M109 Gout, unspecified: Secondary | ICD-10-CM | POA: Diagnosis not present

## 2015-07-29 DIAGNOSIS — M25569 Pain in unspecified knee: Secondary | ICD-10-CM

## 2015-07-29 DIAGNOSIS — Z9884 Bariatric surgery status: Secondary | ICD-10-CM | POA: Insufficient documentation

## 2015-07-29 DIAGNOSIS — R52 Pain, unspecified: Secondary | ICD-10-CM | POA: Diagnosis present

## 2015-07-29 DIAGNOSIS — Z9660 Presence of unspecified orthopedic joint implant: Secondary | ICD-10-CM

## 2015-07-29 DIAGNOSIS — Q762 Congenital spondylolisthesis: Secondary | ICD-10-CM

## 2015-07-29 DIAGNOSIS — Z79899 Other long term (current) drug therapy: Secondary | ICD-10-CM | POA: Diagnosis not present

## 2015-07-29 DIAGNOSIS — Z8614 Personal history of Methicillin resistant Staphylococcus aureus infection: Secondary | ICD-10-CM | POA: Diagnosis not present

## 2015-07-29 DIAGNOSIS — F419 Anxiety disorder, unspecified: Secondary | ICD-10-CM | POA: Diagnosis not present

## 2015-07-29 DIAGNOSIS — M170 Bilateral primary osteoarthritis of knee: Secondary | ICD-10-CM | POA: Insufficient documentation

## 2015-07-29 DIAGNOSIS — L309 Dermatitis, unspecified: Secondary | ICD-10-CM | POA: Insufficient documentation

## 2015-07-29 DIAGNOSIS — E7801 Familial hypercholesterolemia: Secondary | ICD-10-CM | POA: Diagnosis not present

## 2015-07-29 DIAGNOSIS — Z96642 Presence of left artificial hip joint: Secondary | ICD-10-CM | POA: Insufficient documentation

## 2015-07-29 DIAGNOSIS — E559 Vitamin D deficiency, unspecified: Secondary | ICD-10-CM | POA: Diagnosis not present

## 2015-07-29 DIAGNOSIS — M25561 Pain in right knee: Secondary | ICD-10-CM | POA: Insufficient documentation

## 2015-07-29 DIAGNOSIS — J45909 Unspecified asthma, uncomplicated: Secondary | ICD-10-CM | POA: Insufficient documentation

## 2015-07-29 DIAGNOSIS — M25562 Pain in left knee: Secondary | ICD-10-CM | POA: Insufficient documentation

## 2015-07-29 DIAGNOSIS — G8929 Other chronic pain: Secondary | ICD-10-CM | POA: Diagnosis not present

## 2015-07-29 DIAGNOSIS — M4310 Spondylolisthesis, site unspecified: Secondary | ICD-10-CM

## 2015-07-29 DIAGNOSIS — M1990 Unspecified osteoarthritis, unspecified site: Secondary | ICD-10-CM

## 2015-07-29 DIAGNOSIS — M159 Polyosteoarthritis, unspecified: Secondary | ICD-10-CM

## 2015-07-29 DIAGNOSIS — M797 Fibromyalgia: Secondary | ICD-10-CM | POA: Diagnosis not present

## 2015-07-29 DIAGNOSIS — M150 Primary generalized (osteo)arthritis: Secondary | ICD-10-CM

## 2015-07-29 DIAGNOSIS — F5081 Binge eating disorder: Secondary | ICD-10-CM | POA: Insufficient documentation

## 2015-07-29 DIAGNOSIS — M25559 Pain in unspecified hip: Secondary | ICD-10-CM | POA: Insufficient documentation

## 2015-07-29 DIAGNOSIS — Z5181 Encounter for therapeutic drug level monitoring: Secondary | ICD-10-CM

## 2015-07-29 DIAGNOSIS — F1120 Opioid dependence, uncomplicated: Secondary | ICD-10-CM | POA: Diagnosis not present

## 2015-07-29 DIAGNOSIS — M153 Secondary multiple arthritis: Secondary | ICD-10-CM | POA: Insufficient documentation

## 2015-07-29 DIAGNOSIS — E042 Nontoxic multinodular goiter: Secondary | ICD-10-CM | POA: Diagnosis not present

## 2015-07-29 DIAGNOSIS — E282 Polycystic ovarian syndrome: Secondary | ICD-10-CM | POA: Insufficient documentation

## 2015-07-29 DIAGNOSIS — Z79891 Long term (current) use of opiate analgesic: Secondary | ICD-10-CM | POA: Diagnosis not present

## 2015-07-29 DIAGNOSIS — G894 Chronic pain syndrome: Secondary | ICD-10-CM | POA: Diagnosis not present

## 2015-07-29 DIAGNOSIS — E119 Type 2 diabetes mellitus without complications: Secondary | ICD-10-CM | POA: Diagnosis not present

## 2015-07-29 DIAGNOSIS — F1190 Opioid use, unspecified, uncomplicated: Secondary | ICD-10-CM | POA: Insufficient documentation

## 2015-07-29 DIAGNOSIS — M47816 Spondylosis without myelopathy or radiculopathy, lumbar region: Secondary | ICD-10-CM

## 2015-07-29 DIAGNOSIS — F331 Major depressive disorder, recurrent, moderate: Secondary | ICD-10-CM | POA: Insufficient documentation

## 2015-07-29 DIAGNOSIS — M25551 Pain in right hip: Secondary | ICD-10-CM | POA: Insufficient documentation

## 2015-07-29 DIAGNOSIS — Z6841 Body Mass Index (BMI) 40.0 and over, adult: Secondary | ICD-10-CM

## 2015-07-29 DIAGNOSIS — I10 Essential (primary) hypertension: Secondary | ICD-10-CM | POA: Diagnosis not present

## 2015-07-29 HISTORY — DX: Pain in unspecified hip: M25.559

## 2015-07-29 HISTORY — DX: Unspecified osteoarthritis, unspecified site: M19.90

## 2015-07-29 HISTORY — DX: Low back pain, unspecified: M54.50

## 2015-07-29 HISTORY — DX: Pain in unspecified knee: M25.569

## 2015-07-29 HISTORY — DX: Bilateral primary osteoarthritis of knee: M17.0

## 2015-07-29 HISTORY — DX: Presence of unspecified orthopedic joint implant: Z96.60

## 2015-07-29 NOTE — Patient Instructions (Signed)
Do not eat or drink 2 hours before your appt time

## 2015-07-29 NOTE — Progress Notes (Signed)
Patient's Name: Karen Lewis MRN: [id number] DOB: 1971/06/16 DOS: 07/29/2015  Primary Reason(s) for Visit: Encounter for Medication Management. CC: Pain   HPI:   Karen Lewis is a 44 y.o. year old, female patient, who returns today as an established patient. She has History of asthma; Aggrieved; Binge eating disorder; Fibromyalgia; Anxiety, generalized; Insomnia, persistent; Depression, major, recurrent, moderate ([place]); Allergic state; Airway hyperreactivity; Chicken pox; Clinical depression; Dermatitis, eczematoid; Gout; Cephalalgia; BP (high blood pressure); HLD (hyperlipidemia); Headache, migraine; Combined fat and carbohydrate induced hyperlipemia; Multinodular goiter; Obstructive apnea; Bilateral polycystic ovarian syndrome; Apnea, sleep; Disease of thyroid gland; Disease with a predominantly sexual mode of transmission; Avitaminosis D; History of surgical procedure; Type 2 diabetes mellitus ([place]); Chronic pain; Long term current use of opiate analgesic; Long term prescription opiate use; Opiate use; Encounter for therapeutic drug level monitoring; Opiate dependence ([place]); Adjustment disorder with depressed mood; Essential (primary) hypertension; Cannot sleep; Moderate episode of recurrent major depressive disorder ([place]); Goiter, nontoxic, multinodular; Chronic bilateral knee pain; Osteoarthritis of both knees; Morbid obesity with BMI of 60.0-69.9, adult ([place]); Chronic low back pain (R>L); Lumbar facet syndrome (Bilateral) (R>L); Osteoarthrosis; Grade 1 (1.4 cm) Anterolisthesis of L4 over L5; Chronic hip pain (Bilateral) (Left total hip replacement); History of total left hip replacement; History of methicillin resistant staphylococcus aureus (MRSA); Vitamin D deficiency; and History of bariatric surgery on her problem list.. Her primarily concern today is the Pain     The patient returns to the clinic today for medication management and refill. She is still complaining of pain in both knees and she says  that she would like to have some Hyalgan or Synvisc injections. We have already tried the radiofrequency ablation of the genicular nerves, but because of her body habitus we were unsuccessful in provided her with long term benefit. In addition, she would like to also have a treatment for her lower back pain which is likely to be due to her anterolisthesis. Unfortunately, the patient has not lost any weight which is the only thing that would really go along with a in providing her some benefit.  Today's Pain Score: 5  Reported level of pain is incompatible with clinical obrservations. This may be secondary to a possible lack of understanding on how the pain scale works. Pain Type: Chronic pain Pain Location: Back Pain Orientation: Mid, Lower Pain Descriptors / Indicators: Aching, Spasm, Sore Pain Frequency: Constant  Date of Last Visit:   03/18/2015 Service Provided on Last Visit:   Medication management at this [facility] facility.  Pharmacotherapy Review:   Side-effects or Adverse reactions: None reported. Effectiveness: Described as relatively effective, allowing for increase in activities of daily living (ADL). Onset of action: Within expected pharmacological parameters. Duration of action: Within normal limits for medication. Peak effect: Timing and results are as within normal expected parameters. [place] PMP: Compliant with practice rules and regulations. UDS: Compliant with practice rules and regulations. Lab work: No new labs ordered by our practice. Treatment compliance: Compliant. Substance Use Disorder (SUD) Risk Level: Low Planned course of action: Continue therapy as is.  Allergies: Karen Lewis is allergic to bactrim; ciprofloxacin; clindamycin; sulfa antibiotics; and nsaids.  Meds: The patient has a current medication list which includes the following prescription(s): acyclovir, albuterol, amitriptyline, atorvastatin, azelastine, butalbital-apap-caff-cod, cetirizine, clonazepam,  ergocalciferol, fluticasone-salmeterol, gabapentin, gabapentin, lisdexamfetamine, magnesium oxide, meclizine, medroxyprogesterone, metformin, metoprolol, montelukast, multivitamin with minerals, omeprazole, oxycodone-acetaminophen, quetiapine, sertraline, spiriva handihaler, spironolactone, tizanidine, topiramate, true metrix blood glucose test, and zolpidem. Requested Prescriptions    No  prescriptions requested or ordered in this encounter    ROS: Constitutional: Afebrile, no chills, well hydrated and well nourished Gastrointestinal: negative Musculoskeletal:negative Neurological: negative Behavioral/Psych: negative  PFSH: Medical:  Karen Lewis  has a past medical history of Anginal pain ([place]); Hypertension; Dysrhythmia; Asthma; Sleep apnea; Thyroid nodule (bilateral); Diabetes mellitus without complication ([place]); Depression; Anxiety; GERD (gastroesophageal reflux disease); History of hiatal hernia; Headache; Neuromuscular disorder ([place]); Fibromyalgia; Arthritis; Anemia; Obesity; H/O cardiovascular disorder (03/10/2015); H/O thyroid disease (03/10/2015); and H/O surgical procedure (12/05/2012). Family: family history includes Alcohol abuse in her father and mother; Anxiety disorder in her father and mother; COPD in her father; Depression in her brother, father, and mother; Diabetes in her brother, father, and mother; Hypertension in her brother, father, and mother; Kidney cancer in her mother; Kidney failure in her father; Post-traumatic stress disorder in her father; Sleep apnea in her brother, father, and mother. Surgical:  has past surgical history that includes Laparoscopic partial gastrectomy; Shoulder arthroscopy (Right); Joint replacement (Left, hip); Carpal tunnel release (Bilateral); Diagnostic laparoscopy; Cholecystectomy; and Trigger finger release (Right). Tobacco:  reports that she has never smoked. She has never used smokeless tobacco. Alcohol:  reports that she does not drink  alcohol. Drug:  reports that she does not use illicit drugs.  Physical Exam: Vitals:  Today's Vitals   07/29/15 1420 07/29/15 1434  BP: 114/82   Pulse: 107   Temp: 99.8 F (37.7 C)   TempSrc: Oral   Resp: 18   Height: 5\' 4"  (1.626 m)   Weight: 420 lb (190.511 kg)   SpO2: 96%   PainSc: 5  5   PainLoc: Back   Calculated BMI: Body mass index is 72.06 kg/(m^2). General appearance: alert, cooperative, appears stated age, moderate distress and morbidly obese Eyes: conjunctivae/corneas clear. PERRL, EOM's intact. Fundi benign. Lungs: No evidence respiratory distress, no audible rales or ronchi and no use of accessory muscles of respiration Neck: no adenopathy, no carotid bruit, no JVD, supple, symmetrical, trachea midline and thyroid not enlarged, symmetric, no tenderness/mass/nodules Back: symmetric, no curvature. ROM normal. No CVA tenderness. Extremities: Because of her body habitus it is difficult to determine whether or not she is having edema of the extremities or if its her usual normal state. Pulses: 2+ and symmetric Skin: Skin color, texture, turgor normal. No rashes or lesions Neurologic: Gait: Antalgic    Assessment: Encounter Diagnosis:  Primary Diagnosis: Chronic pain [G89.29]  Plan: Karen Lewis was seen today for pain.  Diagnoses and all orders for this visit:  Chronic pain  Long term current use of opiate analgesic -     Drugs of abuse screen w/o alc, rtn urine-sln; Future -     Drugs of abuse screen w/o alc, rtn urine-sln  Long term prescription opiate use  Opiate use  Encounter for therapeutic drug level monitoring  Uncomplicated opioid dependence ([place])  Chronic bilateral knee pain -     KNEE INJECTION; Future  Primary osteoarthritis of both knees  Morbid obesity with BMI of 60.0-69.9, adult ([facility])  Chronic low back pain (R>L)  Lumbar facet syndrome (Bilateral) (R>L)  Primary osteoarthritis involving multiple joints  Grade 1 (1.4 cm) Anterolisthesis  of L4 over L5  Chronic hip pain, unspecified laterality  History of total left hip replacement  History of methicillin resistant staphylococcus aureus (MRSA)  Vitamin D deficiency  History of bariatric surgery  Other orders -     Cancel: gabapentin (NEURONTIN) 600 MG tablet;  -     Cancel: gabapentin (NEURONTIN) 800  MG tablet;  -     Cancel: Hydrocodone-Acetaminophen (VICODIN ES) 7.5-300 MG TABS; Take 1 tablet by mouth as needed (for pain). -     Cancel: magnesium oxide (MAG-OX) 400 MG tablet; Take 1 tablet (400 mg total) by mouth 2 (two) times daily. -     Cancel: oxyCODONE-acetaminophen (PERCOCET/ROXICET) 5-325 MG tablet; Take by mouth every 4 (four) hours as needed for severe pain. -     Cancel: tiZANidine (ZANAFLEX) 4 MG tablet;      Patient Instructions  Do not eat or drink 2 hours before your appt time  Medications discontinued today:  Medications Discontinued During This Encounter  Medication Reason  . fluconazole (DIFLUCAN) 150 MG tablet Completed Course  . QUEtiapine Fumarate (SEROQUEL XR) 150 MG 24 hr tablet Change in therapy  . Hydrocodone-Acetaminophen (VICODIN ES) 7.5-300 MG TABS Error   Medications administered today:  Karen Lewis had no medications administered during this visit.  Primary Care Physician: Idelle Crouch, MD Location: [facility] Facility Note by: Kathlen Brunswick. [name], M.D, DABA, DABAPM, DABPM, DABIPP, FIPP

## 2015-07-29 NOTE — Progress Notes (Signed)
Pill count:  Patient didn't bring pills Hydrocodone pills # 14 1/2 disposed of in the toilet with patient, Josephina Shih, RN and Hart Rochester, RN as witnesses.

## 2015-07-30 ENCOUNTER — Encounter: Payer: Self-pay | Admitting: Psychiatry

## 2015-07-30 ENCOUNTER — Ambulatory Visit (INDEPENDENT_AMBULATORY_CARE_PROVIDER_SITE_OTHER): Payer: Commercial Managed Care - HMO | Admitting: Psychiatry

## 2015-07-30 VITALS — BP 142/90 | HR 118 | Temp 97.5°F | Ht 64.0 in | Wt >= 6400 oz

## 2015-07-30 DIAGNOSIS — F411 Generalized anxiety disorder: Secondary | ICD-10-CM

## 2015-07-30 DIAGNOSIS — E042 Nontoxic multinodular goiter: Secondary | ICD-10-CM | POA: Diagnosis not present

## 2015-07-30 DIAGNOSIS — F331 Major depressive disorder, recurrent, moderate: Secondary | ICD-10-CM

## 2015-07-30 DIAGNOSIS — F5081 Binge eating disorder: Secondary | ICD-10-CM | POA: Diagnosis not present

## 2015-07-30 MED ORDER — QUETIAPINE FUMARATE 100 MG PO TABS: 150.0000 mg | ORAL_TABLET | Freq: Every day | ORAL | Status: DC

## 2015-07-30 MED ORDER — SERTRALINE HCL 100 MG PO TABS: 200.0000 mg | ORAL_TABLET | Freq: Every day | ORAL | Status: DC

## 2015-07-30 MED ORDER — CLONAZEPAM 0.5 MG PO TABS: 0.5000 mg | ORAL_TABLET | Freq: Two times a day (BID) | ORAL | Status: DC | PRN

## 2015-07-30 MED ORDER — LISDEXAMFETAMINE DIMESYLATE 70 MG PO CAPS: 70.0000 mg | ORAL_CAPSULE | ORAL | Status: DC

## 2015-07-30 MED ORDER — ZOLPIDEM TARTRATE 10 MG PO TABS: 10.0000 mg | ORAL_TABLET | Freq: Every evening | ORAL | Status: DC | PRN

## 2015-07-30 NOTE — Progress Notes (Signed)
[place] MD/PA/NP OP Progress Note  07/30/2015 3:14 PM Karen Lewis  MRN:  [id number]  Subjective:  Patient returns for follow-up for major depressive disorder, moderate recurrent in binge eating disorder. Today she relates she is not doing as well as she was at the last visit. She cites that socially she is trying to improve her life by doing some online dating. However she states that there have  been difficult personalities that she has encountered in this process. She also states that she's reflected on her age 44 and started to think that she is not where she thought she would be at this age. She did state the binge eating is good in terms of controlling sweet things but not good in terms of controlling any cravings for salty things.  She states overall she feels like she may be heading towards more depression. However we discussed that the issues above may be external social stressors causing her depression as opposed to just simply being in a major depressive episode. Patient was to some extent in agreement with this and thus we'll wait in terms of addressing any changes in her antidepressant regimen until her next visit. Chief Complaint: Mood is better Chief Complaint    Follow-up; Medication Refill     Visit Diagnosis:     ICD-9-CM ICD-10-CM   1. Binge eating disorder 307.50 F50.81   2. Major depressive disorder, recurrent episode, moderate (HCC) 296.32 F33.1   3. GAD (generalized anxiety disorder) 300.02 F41.1     Past Medical History:  Past Medical History  Diagnosis Date  . Anginal pain ([place])   . Hypertension   . Dysrhythmia   . Asthma   . Sleep apnea   . Thyroid nodule bilateral  . Diabetes mellitus without complication ([place])   . Depression   . Anxiety   . GERD (gastroesophageal reflux disease)   . History of hiatal hernia   . Headache   . Neuromuscular disorder ([place])   . Fibromyalgia   . Arthritis   . Anemia   . Obesity   . H/O cardiovascular disorder 03/10/2015  . H/O  thyroid disease 03/10/2015  . H/O surgical procedure 12/05/2012    Overview:  LSG ([facility] - April 2013)      Past Surgical History  Procedure Laterality Date  . Laparoscopic partial gastrectomy    . Shoulder arthroscopy Right   . Joint replacement Left hip  . Carpal tunnel release Bilateral   . Diagnostic laparoscopy    . Cholecystectomy    . Trigger finger release Right    Family History:  Family History  Problem Relation Age of Onset  . Anxiety disorder Mother   . Depression Mother   . Alcohol abuse Mother   . Diabetes Mother   . Hypertension Mother   . Kidney cancer Mother   . Sleep apnea Mother   . Alcohol abuse Father   . Anxiety disorder Father   . Depression Father   . Post-traumatic stress disorder Father   . Kidney failure Father   . COPD Father   . Diabetes Father   . Hypertension Father   . Sleep apnea Father   . Depression Brother   . Diabetes Brother   . Hypertension Brother   . Sleep apnea Brother    Social History:  Social History   Social History  . Marital Status: Single    Spouse Name: N/A  . Number of Children: N/A  . Years of Education: N/A   Social  History Main Topics  . Smoking status: Never Smoker   . Smokeless tobacco: Never Used  . Alcohol Use: No  . Drug Use: No  . Sexual Activity: Not Currently   Other Topics Concern  . None   Social History Narrative   Additional History:   Assessment:   Musculoskeletal: Strength & Muscle Tone: within normal limits Gait & Station: normal Patient leans: N/A  Psychiatric Specialty Exam: HPI  Review of Systems  Psychiatric/Behavioral: Positive for depression. Negative for suicidal ideas, hallucinations, memory loss and substance abuse. The patient is not nervous/anxious and does not have insomnia.   All other systems reviewed and are negative.   Blood pressure 142/90, pulse 118, temperature 97.5 F (36.4 C), temperature source Tympanic, height 5\' 4"  (1.626 m), weight 415 lb 3.2 oz  (188.333 kg), SpO2 96 %.Body mass index is 71.23 kg/(m^2).  General Appearance: Well Groomed  Eye Contact:  Good  Speech:  Normal Rate  Volume:  Normal  Mood:  Depressed  Affect:  Congruent  Thought Process:  Linear and Logical  Orientation:  Full (Time, Place, and Person)  Thought Content:  Negative  Suicidal Thoughts:  No  Homicidal Thoughts:  No  Memory:  Immediate;   Good Recent;   Good Remote;   Good  Judgement:  Good  Insight:  Good  Psychomotor Activity:  Negative  Concentration:  Good  Recall:  Good  Fund of Knowledge: Good  Language: Good  Akathisia:  Negative  Handed:  Right  AIMS (if indicated):  Normal, done 05/21/15  Assets:  Communication Skills Desire for Improvement  ADL's:  Intact  Cognition: WNL  Sleep:  fair   Is the patient at risk to self?  No. Has the patient been a risk to self in the past 6 months?  No. Has the patient been a risk to self within the distant past?  No. Is the patient a risk to others?  No. Has the patient been a risk to others in the past 6 months?  No. Has the patient been a risk to others within the distant past?  No.  Current Medications: Current Outpatient Prescriptions  Medication Sig Dispense Refill  . acyclovir (ZOVIRAX) 800 MG tablet Take 800 mg by mouth 2 (two) times daily.     Marland Kitchen albuterol (PROVENTIL HFA;VENTOLIN HFA) 108 (90 BASE) MCG/ACT inhaler Inhale 2 puffs into the lungs every 6 (six) hours as needed for wheezing or shortness of breath.    Marland Kitchen amitriptyline (ELAVIL) 100 MG tablet Take 100 mg by mouth at bedtime.     Marland Kitchen atorvastatin (LIPITOR) 40 MG tablet Take 40 mg by mouth daily at 6 PM.     . azelastine (ASTELIN) 0.1 % nasal spray     . Butalbital-APAP-Caff-Cod (FIORICET/CODEINE) 50-300-40-30 MG CAPS Take 1 tablet by mouth as needed (for headache).     . cetirizine (ZYRTEC) 5 MG tablet Take 5 mg by mouth daily.     . clonazePAM (KLONOPIN) 0.5 MG tablet Take 1 tablet (0.5 mg total) by mouth 2 (two) times daily as  needed for anxiety. 60 tablet 2  . ergocalciferol (VITAMIN D2) 50000 UNITS capsule Take 50,000 Units by mouth once a week.    . fluconazole (DIFLUCAN) 100 MG tablet     . Fluticasone-Salmeterol (ADVAIR DISKUS) 500-50 MCG/DOSE AEPB Inhale 1 puff into the lungs 2 (two) times daily.     Marland Kitchen gabapentin (NEURONTIN) 600 MG tablet     . gabapentin (NEURONTIN) 800 MG tablet     .  lisdexamfetamine (VYVANSE) 70 MG capsule Take 1 capsule (70 mg total) by mouth every morning. 30 capsule 0  . magnesium oxide (MAG-OX) 400 MG tablet Take 400 mg by mouth 2 (two) times daily.    . meclizine (ANTIVERT) 25 MG tablet Take 1 tablet (25 mg total) by mouth 3 (three) times daily as needed for dizziness. 30 tablet 0  . medroxyPROGESTERone (DEPO-PROVERA) 150 MG/ML injection     . metFORMIN (GLUCOPHAGE) 1000 MG tablet Take 1,000 mg by mouth 2 (two) times daily with a meal.     . metoprolol (LOPRESSOR) 50 MG tablet Take 50 mg by mouth 2 (two) times daily.     . montelukast (SINGULAIR) 10 MG tablet Take 10 mg by mouth at bedtime.     . Multiple Vitamin (MULTIVITAMIN WITH MINERALS) TABS tablet Take 2 tablets by mouth daily.    Marland Kitchen omeprazole (PRILOSEC) 40 MG capsule Take 40 mg by mouth 2 (two) times daily.     Marland Kitchen oxyCODONE-acetaminophen (PERCOCET/ROXICET) 5-325 MG per tablet Take by mouth every 4 (four) hours as needed for severe pain.    Marland Kitchen QUEtiapine (SEROQUEL) 100 MG tablet Take 1.5 tablets (150 mg total) by mouth at bedtime. 45 tablet 2  . sertraline (ZOLOFT) 100 MG tablet Take 2 tablets (200 mg total) by mouth daily. 60 tablet 2  . SPIRIVA HANDIHALER 18 MCG inhalation capsule     . spironolactone (ALDACTONE) 25 MG tablet Take 12.5 mg by mouth 2 (two) times daily.     Marland Kitchen tiZANidine (ZANAFLEX) 4 MG tablet     . topiramate (TOPAMAX) 100 MG tablet Take 150 mg by mouth 2 (two) times daily.     . TRUE METRIX BLOOD GLUCOSE TEST test strip     . zolpidem (AMBIEN) 10 MG tablet Take 1 tablet (10 mg total) by mouth at bedtime as needed  for sleep. 30 tablet 2   No current facility-administered medications for this visit.    Medical Decision Making:  Established Problem, Worsening (2), Review of Medication Regimen & Side Effects (2) and Review of New Medication or Change in Dosage (2)  Treatment Plan Summary:Medication management and Plan   Major depressive disorder, recurrent moderate-we will continue the patient sertraline at 200 mg daily. She will continue on the Seroquel 150 mg at bedtime. It may be at the next visit that we either discuss switching antidepressants as she is at the maximum dose of sertraline.   GAD-She'll continue on her clonazepam 0.5 mg twice a day as needed.   Insomnia- She will continue her Ambien 10 mg as needed.   Binge Eating Disorder-Patient has a prescription for her will increase her Vyvanse from 60 mg to 70 mg and she still reporting some craving of salty things.  She'll follow up in 1 month. She's been encouraged call any questions or concerns prior to her next appointment.  Faith Rogue 07/30/2015, 3:14 PM

## 2015-07-31 DIAGNOSIS — F341 Dysthymic disorder: Secondary | ICD-10-CM | POA: Diagnosis not present

## 2015-08-05 DIAGNOSIS — R399 Unspecified symptoms and signs involving the genitourinary system: Secondary | ICD-10-CM | POA: Diagnosis not present

## 2015-08-05 DIAGNOSIS — H9209 Otalgia, unspecified ear: Secondary | ICD-10-CM | POA: Diagnosis not present

## 2015-08-05 DIAGNOSIS — K140 Glossitis: Secondary | ICD-10-CM | POA: Diagnosis not present

## 2015-08-05 DIAGNOSIS — N390 Urinary tract infection, site not specified: Secondary | ICD-10-CM | POA: Diagnosis not present

## 2015-08-08 LAB — TOXASSURE SELECT 13 (MW), URINE: PDF: 0

## 2015-08-11 DIAGNOSIS — L600 Ingrowing nail: Secondary | ICD-10-CM | POA: Diagnosis not present

## 2015-08-22 ENCOUNTER — Other Ambulatory Visit: Payer: Self-pay | Admitting: Pain Medicine

## 2015-08-27 ENCOUNTER — Ambulatory Visit: Payer: Commercial Managed Care - HMO | Attending: Pain Medicine | Admitting: Pain Medicine

## 2015-08-27 ENCOUNTER — Ambulatory Visit (INDEPENDENT_AMBULATORY_CARE_PROVIDER_SITE_OTHER): Payer: Commercial Managed Care - HMO | Admitting: Psychiatry

## 2015-08-27 ENCOUNTER — Encounter: Payer: Self-pay | Admitting: Pain Medicine

## 2015-08-27 ENCOUNTER — Other Ambulatory Visit: Payer: Self-pay | Admitting: Pain Medicine

## 2015-08-27 ENCOUNTER — Encounter: Payer: Self-pay | Admitting: Psychiatry

## 2015-08-27 VITALS — BP 158/79 | HR 99 | Temp 98.7°F | Resp 16 | Ht 64.0 in | Wt >= 6400 oz

## 2015-08-27 VITALS — BP 132/88 | HR 122 | Temp 97.9°F | Ht 64.0 in | Wt >= 6400 oz

## 2015-08-27 DIAGNOSIS — M25562 Pain in left knee: Secondary | ICD-10-CM

## 2015-08-27 DIAGNOSIS — M4310 Spondylolisthesis, site unspecified: Secondary | ICD-10-CM

## 2015-08-27 DIAGNOSIS — M797 Fibromyalgia: Secondary | ICD-10-CM | POA: Diagnosis not present

## 2015-08-27 DIAGNOSIS — G8929 Other chronic pain: Secondary | ICD-10-CM

## 2015-08-27 DIAGNOSIS — F5081 Binge eating disorder: Secondary | ICD-10-CM | POA: Insufficient documentation

## 2015-08-27 DIAGNOSIS — E559 Vitamin D deficiency, unspecified: Secondary | ICD-10-CM | POA: Diagnosis not present

## 2015-08-27 DIAGNOSIS — M25561 Pain in right knee: Secondary | ICD-10-CM | POA: Diagnosis not present

## 2015-08-27 DIAGNOSIS — M47816 Spondylosis without myelopathy or radiculopathy, lumbar region: Secondary | ICD-10-CM

## 2015-08-27 DIAGNOSIS — M549 Dorsalgia, unspecified: Secondary | ICD-10-CM | POA: Diagnosis present

## 2015-08-27 DIAGNOSIS — F329 Major depressive disorder, single episode, unspecified: Secondary | ICD-10-CM | POA: Diagnosis not present

## 2015-08-27 DIAGNOSIS — E119 Type 2 diabetes mellitus without complications: Secondary | ICD-10-CM | POA: Insufficient documentation

## 2015-08-27 DIAGNOSIS — Z6841 Body Mass Index (BMI) 40.0 and over, adult: Secondary | ICD-10-CM | POA: Insufficient documentation

## 2015-08-27 DIAGNOSIS — E282 Polycystic ovarian syndrome: Secondary | ICD-10-CM | POA: Diagnosis not present

## 2015-08-27 DIAGNOSIS — Z9884 Bariatric surgery status: Secondary | ICD-10-CM | POA: Diagnosis not present

## 2015-08-27 DIAGNOSIS — Z79899 Other long term (current) drug therapy: Secondary | ICD-10-CM | POA: Diagnosis not present

## 2015-08-27 DIAGNOSIS — J45909 Unspecified asthma, uncomplicated: Secondary | ICD-10-CM | POA: Insufficient documentation

## 2015-08-27 DIAGNOSIS — F411 Generalized anxiety disorder: Secondary | ICD-10-CM

## 2015-08-27 DIAGNOSIS — F331 Major depressive disorder, recurrent, moderate: Secondary | ICD-10-CM

## 2015-08-27 DIAGNOSIS — E042 Nontoxic multinodular goiter: Secondary | ICD-10-CM | POA: Insufficient documentation

## 2015-08-27 DIAGNOSIS — M4316 Spondylolisthesis, lumbar region: Secondary | ICD-10-CM | POA: Diagnosis not present

## 2015-08-27 DIAGNOSIS — R51 Headache: Secondary | ICD-10-CM | POA: Diagnosis not present

## 2015-08-27 DIAGNOSIS — Z96642 Presence of left artificial hip joint: Secondary | ICD-10-CM | POA: Diagnosis not present

## 2015-08-27 DIAGNOSIS — I10 Essential (primary) hypertension: Secondary | ICD-10-CM | POA: Diagnosis not present

## 2015-08-27 DIAGNOSIS — Z79891 Long term (current) use of opiate analgesic: Secondary | ICD-10-CM | POA: Diagnosis not present

## 2015-08-27 DIAGNOSIS — E7801 Familial hypercholesterolemia: Secondary | ICD-10-CM | POA: Diagnosis not present

## 2015-08-27 DIAGNOSIS — F4321 Adjustment disorder with depressed mood: Secondary | ICD-10-CM | POA: Diagnosis not present

## 2015-08-27 DIAGNOSIS — Z5181 Encounter for therapeutic drug level monitoring: Secondary | ICD-10-CM | POA: Diagnosis not present

## 2015-08-27 DIAGNOSIS — F1190 Opioid use, unspecified, uncomplicated: Secondary | ICD-10-CM | POA: Insufficient documentation

## 2015-08-27 DIAGNOSIS — M170 Bilateral primary osteoarthritis of knee: Secondary | ICD-10-CM

## 2015-08-27 DIAGNOSIS — M25569 Pain in unspecified knee: Secondary | ICD-10-CM | POA: Diagnosis present

## 2015-08-27 MED ORDER — OXYCODONE-ACETAMINOPHEN 5-325 MG PO TABS: 1.0000 | ORAL_TABLET | Freq: Four times a day (QID) | ORAL | Status: DC | PRN

## 2015-08-27 MED ORDER — LIDOCAINE HCL (PF) 1 % IJ SOLN: 10.0000 mL | Freq: Once | INTRAMUSCULAR | Status: DC

## 2015-08-27 MED ORDER — SODIUM HYALURONATE (VISCOSUP) 20 MG/2ML IX SOSY: 2.0000 mL | PREFILLED_SYRINGE | Freq: Once | INTRA_ARTICULAR | Status: DC

## 2015-08-27 MED ORDER — LIDOCAINE HCL (PF) 1 % IJ SOLN
INTRAMUSCULAR | Status: AC
  Filled 2015-08-27: qty 5

## 2015-08-27 MED ORDER — LISDEXAMFETAMINE DIMESYLATE 70 MG PO CAPS: 70.0000 mg | ORAL_CAPSULE | ORAL | Status: DC

## 2015-08-27 MED ORDER — MAGNESIUM OXIDE 400 MG PO TABS: 400.0000 mg | ORAL_TABLET | Freq: Two times a day (BID) | ORAL | Status: DC

## 2015-08-27 MED FILL — Sodium Hyaluronate Intra-articular Soln Pref Syr 20 MG/2ML: INTRA_ARTICULAR | Qty: 1 | Status: AC

## 2015-08-27 NOTE — Patient Instructions (Signed)
Knee Injection A knee injection is a procedure to get medicine into your knee joint. Your health care provider puts a needle into the joint and injects medicine with an attached syringe. The injected medicine may relieve the pain, swelling, and stiffness of arthritis. The injected medicine may also help to lubricate and cushion your knee joint. You may need more than one injection. LET [facility] CARE PROVIDER KNOW ABOUT:  Any allergies you have.  All medicines you are taking, including vitamins, herbs, eye drops, creams, and over-the-counter medicines.  Previous problems you or members of your family have had with the use of anesthetics.  Any blood disorders you have.  Previous surgeries you have had.  Any medical conditions you may have. RISKS AND COMPLICATIONS Generally, this is a safe procedure. However, problems may occur, including:  Infection.  Bleeding.  Worsening symptoms.  Damage to the area around your knee.  Allergic reaction to any of the medicines.  Skin reactions from repeated injections. BEFORE THE PROCEDURE  Ask your health care provider about changing or stopping your regular medicines. This is especially important if you are taking diabetes medicines or blood thinners.  Plan to have someone take you home after the procedure. PROCEDURE  You will sit or lie down in a position for your knee to be treated.  The skin over your kneecap will be cleaned with a germ-killing solution (antiseptic).  You will be given a medicine that numbs the area (local anesthetic). You may feel some stinging.  After your knee becomes numb, you will have a second injection. This is the medicine. This needle is carefully placed between your kneecap and your knee. The medicine is injected into the joint space.  At the end of the procedure, the needle will be removed.  A bandage (dressing) may be placed over the injection site. The procedure may vary among health care providers  and hospitals. AFTER THE PROCEDURE  You may have to move your knee through its full range of motion. This helps to get all of the medicine into your joint space.  Your blood pressure, heart rate, breathing rate, and blood oxygen level will be monitored often until the medicines you were given have worn off.  You will be watched to make sure that you do not have a reaction to the injected medicine.   This information is not intended to replace advice given to you by your health care provider. Make sure you discuss any questions you have with your health care provider.   Document Released: 11/21/2006 Document Revised: 09/20/2014 Document Reviewed: 07/10/2014 Elsevier Interactive Patient Education 2016 [employer] Pain Management Discharge Instructions  General Discharge Instructions :  If you need to reach your doctor call: Monday-Friday 8:00 am - 4:00 pm at [phone] or toll free [phone].  After clinic hours [phone] to have operator reach doctor.  Bring all of your medication bottles to all your appointments in the pain clinic.  To cancel or reschedule your appointment with Pain Management please remember to call 24 hours in advance to avoid a fee.  Refer to the educational materials which you have been given on: General Risks, I had my Procedure. Discharge Instructions, Post Sedation.  Post Procedure Instructions:  The drugs you were given will stay in your system until tomorrow, so for the next 24 hours you should not drive, make any legal decisions or drink any alcoholic beverages.  You may eat anything you prefer, but it is better to start with liquids then  soups and crackers, and gradually work up to solid foods.  Please notify your doctor immediately if you have any unusual bleeding, trouble breathing or pain that is not related to your normal pain.  Depending on the type of procedure that was done, some parts of your body may feel week and/or numb.  This  usually clears up by tonight or the next day.  Walk with the use of an assistive device or accompanied by an adult for the 24 hours.  You may use ice on the affected area for the first 24 hours.  Put ice in a Ziploc bag and cover with a towel and place against area 15 minutes on 15 minutes off.  You may switch to heat after 24 hours. 

## 2015-08-27 NOTE — Progress Notes (Signed)
Safety precautions to be maintained throughout the outpatient stay will include: orient to surroundings, keep bed in low position, maintain call bell within reach at all times, provide assistance with transfer out of bed and ambulation.  

## 2015-08-27 NOTE — Progress Notes (Signed)
Patient's Name: Karen Lewis MRN: [id number] DOB: 06-02-1971 DOS: 08/27/2015  Primary Reason(s) for Visit: Interventional Pain Management Treatment. CC: Knee Pain and Back Pain I did  Pre-Procedure Assessment: Ms. Adelstein is a 44 y.o. year old, female patient, seen today for interventional treatment. 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; Avitaminosis D; History of surgical procedure; Type 2 diabetes mellitus ([place]); Chronic pain; Long term current use of opiate analgesic; Long term prescription opiate use; Opiate use; Encounter for therapeutic drug level monitoring; Opiate dependence ([place]); Adjustment disorder with depressed mood; Essential (primary) hypertension; Cannot sleep; Moderate episode of recurrent major depressive disorder ([place]); Goiter, nontoxic, multinodular; Chronic bilateral knee pain; Osteoarthritis of both knees; Chronic low back pain (R>L); Lumbar facet syndrome (Bilateral) (R>L); Osteoarthrosis; Grade 1 (1.4 cm) Anterolisthesis of L4 over L5; Chronic hip pain (Bilateral) (Left total hip replacement); History of total left hip replacement; History of methicillin resistant staphylococcus aureus (MRSA); Vitamin D deficiency; History of bariatric surgery; Hypomagnesemia; and Morbid obesity with BMI of 70 and over, adult (Karen Lewis) (71.75 on 08/27/2015) on her problem list.. Her primarily concern today is the Knee Pain and Back Pain  The patient comes in today clinics  today for: 1. Pharmacological management of her chronic pain. She indicates that she does not take her medications all of the time, but occasionally has to double up to control the pain. The patient was instructed to make sure that her monthly supply last the entire 30 days. 2. She comes in today for bilateral intra-articular Hyalgan knee injections. She had these done on 07/16/2014 with good results, unfortunately when we tried to have it repeated at the [facility] here insurance denied it. 3. Unfortunately, she has gained some weight and is having low back pain which is likely to be due to facet disease from her grade 1 anterolisthesis of L4 over L5 and her lumbar osteoarthritis. She was to know if there is anything that I can do for that. I have offered her to repeat the lumbar facet block and assuming that she gets good short-term relief of the pain with the local anesthetic, we may be able to move on to radiofrequency.  Verification of the correct person, correct site (including marking of site), and correct procedure were performed and confirmed by the patient. Today's Vitals   08/27/15 1502 08/27/15 1505 08/27/15 1511 08/27/15 1514  BP: 145/86 132/69 140/88 158/79  Pulse: 104 97 99 99  Temp:      TempSrc:      Resp: _0 Height:      Weight:      SpO2: 97% 98% 97% 97%  PainSc:    5   Calculated BMI: Body mass index is 71.71 kg/(m^2). Allergies: She is allergic to bactrim; ciprofloxacin; clindamycin; sulfa antibiotics; and nsaids.. Primary Diagnosis: Bilateral chronic knee pain [M25.561, M25.562, G89.29]  Procedure: Type: Palliative Intra-Articular Knee Injection Region: Lateral  Knee Region Level: Knee Joint Laterality: Bilateral  Indications: Chronic Pain Knee Joint Pain  Consent: Secured. Under the influence of no sedatives a written informed consent was obtained, after having provided information on the risks and possible complications. To  fulfill our ethical and  legal obligations, as recommended by the American Medical Association's Code of Ethics, we have provided information to the patient about our clinical impression; the nature and purpose of the treatment or procedure; the risks, benefits, and possible complications of the intervention; alternatives; the risk(s) and benefit(s) of the alternative treatment(s) or procedure(s); and the risk(s) and benefit(s) of doing nothing. The patient was provided information about the risks and possible complications associated with the procedure. These include, but are not limited to, failure to achieve desired goals, infection, bleeding, organ or nerve damage, allergic reactions, paralysis, and death. In the case of intra- or periarticular procedures these may include, but are not limited to, failure to achieve desired goals, infection, bleeding (hemarthrosis), organ or nerve damage, allergic reactions, and death. In addition, the patient was informed that Medicine is not an exact science; therefore, there is also the possibility of unforeseen risks and possible complications that may result in a catastrophic outcome. The patient indicated having understood very clearly. We have given the patient no guarantees and we have made no promises. Enough time was given to the patient to ask questions, all of which were answered to the patient's satisfaction.  Pre-Procedure Preparation: Safety Precautions: Allergies reviewed. Appropriate site, procedure, and patient were confirmed by following the Joint Commission's Universal Protocol (UP.01.01.01), in the form of a "Time Out". The patient was asked to confirm marked site and procedure, before commencing. The patient was asked about blood thinners, or active infections, both of which were denied. Patient was assessed for positional comfort and all pressure points were checked before starting procedure. Monitoring:  As per clinic protocol. Infection Control Precautions: Sterile  technique used. Standard Universal Precautions were taken as recommended by the Department of [facility] for Disease Control and Prevention (CDC). Standard pre-surgical skin prep was conducted. Respiratory hygiene and cough etiquette was practiced. Hand hygiene observed. Safe injection practices and needle disposal techniques followed. SDV (single dose vial) medications used. Medications properly checked for expiration dates and contaminants. Personal protective equipment (PPE) used: Sterile surgical gloves.  Anesthesia, Analgesia, Anxiolysis: Type: Local Anesthesia Local Anesthetic(s): Lidocaine 1% Route: Subcutaneous IV Access: None. Sedation: Declined. Indication(s): Patient declined.  Description of Procedure Process:  Time-out: "Time-out" completed before starting procedure, as per protocol. Position: Supine Target Area: Knee Joint Approach: Lateral approach. Area Prepped: Entire knee area, from the mid-thigh to the mid-shin. Prepping solution: ChloraPrep (2% chlorhexidine gluconate and 70% isopropyl alcohol) Safety Precautions: Aspiration looking for blood return was conducted prior to all injections. At no point did we inject any substances, as a needle was being advanced. No attempts were made at seeking any paresthesias. Safe injection practices and needle disposal techniques used. Medications properly checked for expiration dates. SDV (single dose vial) medications used. Description of the Procedure: Protocol guidelines were followed. The patient was placed in position over the fluoroscopy table. The target area was identified and the area prepped in the usual manner. Skin desensitized using vapocoolant spray. Skin & deeper tissues infiltrated with local anesthetic. Appropriate amount of time allowed to pass for local anesthetics to take effect. The procedure needles were then advanced to the target area. Proper needle placement secured. Negative aspiration confirmed. Solution  injected in intermittent fashion, asking for systemic symptoms every 0.5cc of injectate. The needles were then removed and the area cleansed, making sure to leave some of the prepping solution back to take advantage of its long term bactericidal properties. EBL: None Materials & Medications Used:  Needle(s) Used: 22g - 1.5" Needle(s) Medications Administered today: Hyalgan 20 mg per mL (1 mL) 2.  (Bilateral) Please see chart orders for dosing details.  Imaging Guidance:  Type of Imaging Technique: Fluoroscopy Guidance (Non-spinal) Indication(s): Assistance in needle guidance and placement for procedures requiring needle placement in or near specific anatomical locations not easily accessible without such assistance. Exposure Time: Please see nurses notes. Contrast: None used. Fluoroscopic Guidance: I was personally present in the fluoroscopy suite, where the patient was placed in position for the procedure, over the fluoroscopy-compatible table. Fluoroscopy was manipulated, using "Tunnel Vision Technique", to obtain the best possible view of the target area, on the affected side. Parallax error was corrected before commencing the procedure. A "direction-depth-direction" technique was used to introduce the needle under continuous pulsed fluoroscopic guidance. Permanently recorded images stored by scanning into EMR. Ultrasound Guidance: Not used. Interpretation: Intraoperative imaging interpretation by performing Physician. Adequate needle placement confirmed. Adequate needle placement confirmed in AP, lateral, & Oblique Views. No contrast injected. Permanent hardcopy images in multiple planes scanned into the patient's record.  Antibiotics:  Type:  Antibiotics Given (last 72 hours)    None      Indication(s): No indications identified.  Post-operative Assessment:  Complications: No immediate post-treatment complications were observed. Disposition: Return to clinic for follow-up evaluation.  The patient tolerated the entire procedure well. A repeat set of vitals were taken after the procedure and the patient was kept under observation following institutional policy, for this type of procedure. The patient was discharged home, once institutional criteria were met. The patient was provided with post-procedure discharge instructions, including a section on how to identify potential problems. Should any problems arise concerning this procedure, the patient was given instructions to immediately contact us, at any time, without hesitation. In any case, we plan to contact the patient by telephone for a follow-up status report regarding this interventional procedure. Comments:  No additional relevant information. The patient did experience more discomfort when doing the left side than the right. Even with this skin infiltration localized static it was clear that she was having some allodynia.   Primary Care Physician: Idelle Crouch, MD Location: [facility] Note by: Kathlen Brunswick. [name], M.D, DABA, DABAPM, DABPM, DABIPP, FIPP  Disclaimer:  Medicine is not an exact science. The only guarantee in medicine is that nothing is guaranteed. It is important to note that the decision to proceed with this intervention was based on the information collected from the patient. The Data and conclusions were drawn from the patient's questionnaire, the interview, and the physical examination. Because the information was provided in large part by the patient, it cannot be guaranteed that it has not been purposely or unconsciously manipulated. Every effort has been made to obtain as much relevant data as possible for this evaluation. It is important to note that the conclusions that lead to this procedure are derived in large part from the available data. Always take into account that the treatment will also be dependent on availability of resources and existing treatment guidelines,  considered by other Pain Management Practitioners as being common knowledge and practice, at the time of the intervention. For Medico-Legal purposes, it is also important to point out that variation in procedural techniques and pharmacological choices are the acceptable norm. The indications, contraindications, technique, and results of the above procedure should only be interpreted and judged by a Board-Certified Interventional Pain Specialist with extensive familiarity and expertise in the same exact procedure and technique.  Attempts at providing opinions without similar or greater experience and expertise than that of the treating physician will be considered as inappropriate and unethical, and shall result in a formal complaint to the state medical board and applicable specialty societies. Daisyreviewed the middle ear

## 2015-08-27 NOTE — Addendum Note (Signed)
Addended by: Milinda Pointer A on: 08/27/2015 03:01 PM   Modules accepted: Orders

## 2015-08-27 NOTE — Progress Notes (Signed)
BH MD/PA/NP OP Progress Note  08/27/2015 4:05 PM Karen Lewis  MRN:  [id number]  Subjective:  Patient returns for follow-up for major depressive disorder, moderate recurrent in binge eating disorder. Patient indicated that overall she is doing pretty well. She states that as far as the dating issue she has not been doing well. She discussed a most recent incident in which she dated a man for about 2 weeks. She states that he had a problematic background however she became involved with him. She states after about 2 weeks she made a decision to discontinue the relationship but she wonders why she even got involved in the first place. She states that the Vyvanse probably has been helping her binging. She states that before the Vyvanse she might eat a wide variety of foods but now her binging is related to potato chips only as opposed to before it would be binging on potato spouse several other foods such as popcorn.  She states she has a girlfriend who has some drug related issues who she is helping out. We discussed this Seroquel and she stated the Seroquel as made a big difference because she does not react to things with intense crying episodes. Chief Complaint: Okay Chief Complaint    Follow-up; Medication Refill     Visit Diagnosis:   No diagnosis found.  Past Medical History:  Past Medical History  Diagnosis Date  . Anginal pain ([place])   . Hypertension   . Dysrhythmia   . Asthma   . Sleep apnea   . Thyroid nodule bilateral  . Diabetes mellitus without complication ([place])   . Depression   . Anxiety   . GERD (gastroesophageal reflux disease)   . History of hiatal hernia   . Headache   . Neuromuscular disorder ([place])   . Fibromyalgia   . Arthritis   . Anemia   . Obesity   . H/O cardiovascular disorder 03/10/2015  . H/O thyroid disease 03/10/2015  . H/O surgical procedure 12/05/2012    Overview:  LSG ([facility] - April 2013)      Past Surgical History  Procedure Laterality Date  .  Laparoscopic partial gastrectomy    . Shoulder arthroscopy Right   . Joint replacement Left hip  . Carpal tunnel release Bilateral   . Diagnostic laparoscopy    . Cholecystectomy    . Trigger finger release Right    Family History:  Family History  Problem Relation Age of Onset  . Anxiety disorder Mother   . Depression Mother   . Alcohol abuse Mother   . Diabetes Mother   . Hypertension Mother   . Kidney cancer Mother   . Sleep apnea Mother   . Alcohol abuse Father   . Anxiety disorder Father   . Depression Father   . Post-traumatic stress disorder Father   . Kidney failure Father   . COPD Father   . Diabetes Father   . Hypertension Father   . Sleep apnea Father   . Depression Brother   . Diabetes Brother   . Hypertension Brother   . Sleep apnea Brother    Social History:  Social History   Social History  . Marital Status: Single    Spouse Name: N/A  . Number of Children: N/A  . Years of Education: N/A   Social History Main Topics  . Smoking status: Never Smoker   . Smokeless tobacco: Never Used  . Alcohol Use: No  . Drug Use: No  . Sexual  Activity: Not Currently   Other Topics Concern  . None   Social History Narrative   Additional History:   Assessment:   Musculoskeletal: Strength & Muscle Tone: within normal limits Gait & Station: normal Patient leans: N/A  Psychiatric Specialty Exam: HPI  Review of Systems  Psychiatric/Behavioral: Negative for depression, suicidal ideas, hallucinations, memory loss and substance abuse. The patient is not nervous/anxious and does not have insomnia.   All other systems reviewed and are negative.   Blood pressure 132/88, pulse 122, temperature 97.9 F (36.6 C), temperature source Tympanic, height 5\' 4"  (1.626 m), weight 419 lb 9.6 oz (190.329 kg), SpO2 93 %.Body mass index is 71.99 kg/(m^2).  General Appearance: Well Groomed  Eye Contact:  Good  Speech:  Normal Rate  Volume:  Normal  Mood:  good  Affect:   Fairly bright and able to laugh to most the appointment, tearful when I discussed my departure, then joking at the end of the appointment and smiling   Thought Process:  Linear and Logical  Orientation:  Full (Time, Place, and Person)  Thought Content:  Negative  Suicidal Thoughts:  No  Homicidal Thoughts:  No  Memory:  Immediate;   Good Recent;   Good Remote;   Good  Judgement:  Good  Insight:  Good  Psychomotor Activity:  Negative  Concentration:  Good  Recall:  Good  Fund of Knowledge: Good  Language: Good  Akathisia:  Negative  Handed:  Right  AIMS (if indicated):  Normal, done 05/21/15  Assets:  Communication Skills Desire for Improvement  ADL's:  Intact  Cognition: WNL  Sleep:  fair   Is the patient at risk to self?  No. Has the patient been a risk to self in the past 6 months?  No. Has the patient been a risk to self within the distant past?  No. Is the patient a risk to others?  No. Has the patient been a risk to others in the past 6 months?  No. Has the patient been a risk to others within the distant past?  No.  Current Medications: Current Outpatient Prescriptions  Medication Sig Dispense Refill  . acyclovir (ZOVIRAX) 800 MG tablet Take 800 mg by mouth 2 (two) times daily.     Marland Kitchen albuterol (PROVENTIL HFA;VENTOLIN HFA) 108 (90 BASE) MCG/ACT inhaler Inhale 2 puffs into the lungs every 6 (six) hours as needed for wheezing or shortness of breath.    Marland Kitchen amitriptyline (ELAVIL) 100 MG tablet Take 100 mg by mouth at bedtime.     Marland Kitchen atorvastatin (LIPITOR) 40 MG tablet Take 40 mg by mouth daily at 6 PM.     . azelastine (ASTELIN) 0.1 % nasal spray     . butalbital-acetaminophen-caffeine (FIORICET, ESGIC) 50-325-40 MG tablet as needed.    . Butalbital-APAP-Caff-Cod (FIORICET/CODEINE) 50-300-40-30 MG CAPS Take 1 tablet by mouth as needed (for headache).     . cetirizine (ZYRTEC) 5 MG tablet Take 5 mg by mouth daily.     . clonazePAM (KLONOPIN) 0.5 MG tablet Take 1 tablet (0.5 mg  total) by mouth 2 (two) times daily as needed for anxiety. 60 tablet 2  . ergocalciferol (VITAMIN D2) 50000 UNITS capsule Take 50,000 Units by mouth once a week.    . fluconazole (DIFLUCAN) 100 MG tablet     . Fluticasone-Salmeterol (ADVAIR DISKUS) 500-50 MCG/DOSE AEPB Inhale 1 puff into the lungs 2 (two) times daily.     Marland Kitchen gabapentin (NEURONTIN) 600 MG tablet     .  gabapentin (NEURONTIN) 800 MG tablet     . ketoconazole (NIZORAL) 2 % cream APPLY AS DIRECTED TO AFFECTED AREA TWICE DAILY AS NEEDED    . lisdexamfetamine (VYVANSE) 70 MG capsule Take 1 capsule (70 mg total) by mouth every morning. 30 capsule 0  . magnesium oxide (MAG-OX) 400 MG tablet Take 1 tablet (400 mg total) by mouth 2 (two) times daily. 60 tablet 2  . meclizine (ANTIVERT) 25 MG tablet Take 1 tablet (25 mg total) by mouth 3 (three) times daily as needed for dizziness. 30 tablet 0  . medroxyPROGESTERone (DEPO-PROVERA) 150 MG/ML injection     . metFORMIN (GLUCOPHAGE) 1000 MG tablet Take 1,000 mg by mouth 2 (two) times daily with a meal.     . metoprolol (LOPRESSOR) 50 MG tablet Take 50 mg by mouth 2 (two) times daily.     . montelukast (SINGULAIR) 10 MG tablet Take 10 mg by mouth at bedtime.     . Multiple Vitamin (MULTIVITAMIN WITH MINERALS) TABS tablet Take 2 tablets by mouth daily.    Marland Kitchen omeprazole (PRILOSEC) 40 MG capsule Take 40 mg by mouth 2 (two) times daily.     Marland Kitchen oxyCODONE-acetaminophen (PERCOCET/ROXICET) 5-325 MG tablet Take 1 tablet by mouth every 6 (six) hours as needed for moderate pain or severe pain. 120 tablet 0  . oxyCODONE-acetaminophen (PERCOCET/ROXICET) 5-325 MG tablet Take 1 tablet by mouth every 6 (six) hours as needed for moderate pain or severe pain. 120 tablet 0  . oxyCODONE-acetaminophen (PERCOCET/ROXICET) 5-325 MG tablet Take 1 tablet by mouth every 6 (six) hours as needed for moderate pain or severe pain. 120 tablet 0  . QUEtiapine (SEROQUEL) 100 MG tablet Take 1.5 tablets (150 mg total) by mouth at  bedtime. 45 tablet 2  . sertraline (ZOLOFT) 100 MG tablet Take 2 tablets (200 mg total) by mouth daily. 60 tablet 2  . SPIRIVA HANDIHALER 18 MCG inhalation capsule     . spironolactone (ALDACTONE) 25 MG tablet Take 12.5 mg by mouth 2 (two) times daily.     Marland Kitchen tiZANidine (ZANAFLEX) 4 MG tablet     . topiramate (TOPAMAX) 100 MG tablet Take 150 mg by mouth 2 (two) times daily.     Marland Kitchen triamcinolone (NASACORT) 55 MCG/ACT AERO nasal inhaler 2 sprays by Nasal route 2 (two) times daily.    . TRUE METRIX BLOOD GLUCOSE TEST test strip     . zolpidem (AMBIEN) 10 MG tablet Take 1 tablet (10 mg total) by mouth at bedtime as needed for sleep. 30 tablet 2   Current Facility-Administered Medications  Medication Dose Route Frequency Provider Last Rate Last Dose  . lidocaine (PF) (XYLOCAINE) 1 % injection 10 mL  10 mL Infiltration Once Milinda Pointer, MD      . Sodium Hyaluronate SOSY 2 mL  2 mL Intra-articular Once Milinda Pointer, MD        Medical Decision Making:  Established Problem, Worsening (2), Review of Medication Regimen & Side Effects (2) and Review of New Medication or Change in Dosage (2)  Treatment Plan Summary:Medication management and Plan   Major depressive disorder, recurrent moderate-we will continue the patient sertraline at 200 mg daily. She will continue on the Seroquel 150 mg at bedtime. It may be at the next visit that we either discuss switching antidepressants as she is at the maximum dose of sertraline.   GAD-She'll continue on her clonazepam 0.5 mg twice a day as needed.   Insomnia- She will continue her Ambien 10 mg  as needed.   Binge Eating Disorder-Continue  Vyvanse 70 mg.  She'll follow up in 1 month. I have discussed my departure from the clinic in February 2017. She's been encouraged call any questions or concerns prior to her next appointment.  Faith Rogue 08/27/2015, 4:05 PM

## 2015-08-27 NOTE — Addendum Note (Signed)
Addended by: Milinda Pointer A on: 08/27/2015 04:09 PM   Modules accepted: Orders, Level of Service

## 2015-08-27 NOTE — Progress Notes (Signed)
Oxycodone #103 tabs remaining

## 2015-08-28 DIAGNOSIS — F341 Dysthymic disorder: Secondary | ICD-10-CM | POA: Diagnosis not present

## 2015-09-01 DIAGNOSIS — R0609 Other forms of dyspnea: Secondary | ICD-10-CM | POA: Diagnosis not present

## 2015-09-01 DIAGNOSIS — M79674 Pain in right toe(s): Secondary | ICD-10-CM | POA: Diagnosis not present

## 2015-09-01 DIAGNOSIS — R51 Headache: Secondary | ICD-10-CM | POA: Diagnosis not present

## 2015-09-01 DIAGNOSIS — M797 Fibromyalgia: Secondary | ICD-10-CM | POA: Diagnosis not present

## 2015-09-01 DIAGNOSIS — G4733 Obstructive sleep apnea (adult) (pediatric): Secondary | ICD-10-CM | POA: Diagnosis not present

## 2015-09-01 DIAGNOSIS — J4531 Mild persistent asthma with (acute) exacerbation: Secondary | ICD-10-CM | POA: Diagnosis not present

## 2015-09-01 DIAGNOSIS — G4739 Other sleep apnea: Secondary | ICD-10-CM | POA: Diagnosis not present

## 2015-09-01 DIAGNOSIS — F329 Major depressive disorder, single episode, unspecified: Secondary | ICD-10-CM | POA: Diagnosis not present

## 2015-09-01 DIAGNOSIS — R05 Cough: Secondary | ICD-10-CM | POA: Diagnosis not present

## 2015-09-01 DIAGNOSIS — M79675 Pain in left toe(s): Secondary | ICD-10-CM | POA: Diagnosis not present

## 2015-09-01 DIAGNOSIS — L600 Ingrowing nail: Secondary | ICD-10-CM | POA: Diagnosis not present

## 2015-09-02 ENCOUNTER — Ambulatory Visit: Payer: Commercial Managed Care - HMO | Attending: Pain Medicine | Admitting: Pain Medicine

## 2015-09-02 DIAGNOSIS — R509 Fever, unspecified: Secondary | ICD-10-CM | POA: Diagnosis not present

## 2015-09-02 DIAGNOSIS — B370 Candidal stomatitis: Secondary | ICD-10-CM | POA: Diagnosis not present

## 2015-09-02 DIAGNOSIS — K120 Recurrent oral aphthae: Secondary | ICD-10-CM | POA: Diagnosis not present

## 2015-09-02 DIAGNOSIS — G8929 Other chronic pain: Secondary | ICD-10-CM

## 2015-09-02 NOTE — Progress Notes (Signed)
Patient ID: Karen Lewis, female   DOB: 17-Oct-1970, 44 y.o.   MRN: [id number] Procedure canceled and rescheduled for a later time. The patient did not follow that nothing by mouth orders. No charge for this visit.

## 2015-09-03 ENCOUNTER — Telehealth: Payer: Self-pay | Admitting: *Deleted

## 2015-09-03 NOTE — Telephone Encounter (Signed)
Left voice mail

## 2015-09-04 ENCOUNTER — Other Ambulatory Visit: Payer: Self-pay | Admitting: Internal Medicine

## 2015-09-04 DIAGNOSIS — E041 Nontoxic single thyroid nodule: Secondary | ICD-10-CM

## 2015-09-04 LAB — TOXASSURE SELECT 13 (MW), URINE: PDF: 0

## 2015-09-09 ENCOUNTER — Ambulatory Visit: Payer: Commercial Managed Care - HMO | Admitting: Pain Medicine

## 2015-09-10 NOTE — Progress Notes (Signed)
Pharmacy notified about abilify and the seroquel xr.  Other medications were reordered

## 2015-09-11 NOTE — Progress Notes (Signed)
Quick Note:  The findings of this UDT were reported as abnormal due to inconsistencies with expected results. Expectations were based on the medication history provided by the patient at the time of sample collection. Upon return, these results will be shared with the client in an effort to clarify them before making any changes in the clinical management of the medications. ______ 

## 2015-09-12 ENCOUNTER — Other Ambulatory Visit: Payer: Self-pay | Admitting: Internal Medicine

## 2015-09-12 ENCOUNTER — Ambulatory Visit
Admission: RE | Admit: 2015-09-12 | Discharge: 2015-09-12 | Disposition: A | Discharge status: Home / Self Care | Payer: Commercial Managed Care - HMO | Source: Ambulatory Visit | Attending: Internal Medicine | Admitting: Internal Medicine

## 2015-09-12 DIAGNOSIS — E041 Nontoxic single thyroid nodule: Secondary | ICD-10-CM

## 2015-09-12 DIAGNOSIS — E042 Nontoxic multinodular goiter: Secondary | ICD-10-CM | POA: Diagnosis not present

## 2015-09-12 NOTE — Procedures (Signed)
US guided FNAs of isthmus and left inferior thyroid nodule.  6 FNAs from isthmus and 5 FNAs from left nodule.  No immediate complication.

## 2015-09-17 DIAGNOSIS — Z202 Contact with and (suspected) exposure to infections with a predominantly sexual mode of transmission: Secondary | ICD-10-CM | POA: Diagnosis not present

## 2015-09-17 LAB — CYTOLOGY - NON PAP

## 2015-09-18 ENCOUNTER — Ambulatory Visit: Payer: Commercial Managed Care - HMO | Attending: Pain Medicine | Admitting: Pain Medicine

## 2015-09-18 ENCOUNTER — Encounter: Payer: Self-pay | Admitting: Pain Medicine

## 2015-09-18 VITALS — BP 120/85 | HR 86 | Resp 18

## 2015-09-18 DIAGNOSIS — F329 Major depressive disorder, single episode, unspecified: Secondary | ICD-10-CM | POA: Diagnosis not present

## 2015-09-18 DIAGNOSIS — I10 Essential (primary) hypertension: Secondary | ICD-10-CM | POA: Insufficient documentation

## 2015-09-18 DIAGNOSIS — F411 Generalized anxiety disorder: Secondary | ICD-10-CM | POA: Insufficient documentation

## 2015-09-18 DIAGNOSIS — F331 Major depressive disorder, recurrent, moderate: Secondary | ICD-10-CM | POA: Insufficient documentation

## 2015-09-18 DIAGNOSIS — E7801 Familial hypercholesterolemia: Secondary | ICD-10-CM | POA: Insufficient documentation

## 2015-09-18 DIAGNOSIS — Z96642 Presence of left artificial hip joint: Secondary | ICD-10-CM | POA: Diagnosis not present

## 2015-09-18 DIAGNOSIS — M170 Bilateral primary osteoarthritis of knee: Secondary | ICD-10-CM | POA: Diagnosis not present

## 2015-09-18 DIAGNOSIS — M47816 Spondylosis without myelopathy or radiculopathy, lumbar region: Secondary | ICD-10-CM | POA: Diagnosis not present

## 2015-09-18 DIAGNOSIS — G8929 Other chronic pain: Secondary | ICD-10-CM | POA: Diagnosis not present

## 2015-09-18 DIAGNOSIS — M797 Fibromyalgia: Secondary | ICD-10-CM | POA: Insufficient documentation

## 2015-09-18 DIAGNOSIS — M47817 Spondylosis without myelopathy or radiculopathy, lumbosacral region: Secondary | ICD-10-CM | POA: Insufficient documentation

## 2015-09-18 DIAGNOSIS — M545 Low back pain, unspecified: Secondary | ICD-10-CM

## 2015-09-18 DIAGNOSIS — F5081 Binge eating disorder: Secondary | ICD-10-CM | POA: Insufficient documentation

## 2015-09-18 DIAGNOSIS — E042 Nontoxic multinodular goiter: Secondary | ICD-10-CM | POA: Insufficient documentation

## 2015-09-18 DIAGNOSIS — E119 Type 2 diabetes mellitus without complications: Secondary | ICD-10-CM | POA: Diagnosis not present

## 2015-09-18 DIAGNOSIS — J45909 Unspecified asthma, uncomplicated: Secondary | ICD-10-CM | POA: Diagnosis not present

## 2015-09-18 DIAGNOSIS — M4316 Spondylolisthesis, lumbar region: Secondary | ICD-10-CM | POA: Insufficient documentation

## 2015-09-18 DIAGNOSIS — M25559 Pain in unspecified hip: Secondary | ICD-10-CM | POA: Diagnosis present

## 2015-09-18 DIAGNOSIS — G43909 Migraine, unspecified, not intractable, without status migrainosus: Secondary | ICD-10-CM | POA: Diagnosis not present

## 2015-09-18 DIAGNOSIS — Z9884 Bariatric surgery status: Secondary | ICD-10-CM | POA: Insufficient documentation

## 2015-09-18 DIAGNOSIS — M160 Bilateral primary osteoarthritis of hip: Secondary | ICD-10-CM

## 2015-09-18 DIAGNOSIS — Q762 Congenital spondylolisthesis: Secondary | ICD-10-CM | POA: Diagnosis not present

## 2015-09-18 DIAGNOSIS — E559 Vitamin D deficiency, unspecified: Secondary | ICD-10-CM | POA: Insufficient documentation

## 2015-09-18 DIAGNOSIS — M549 Dorsalgia, unspecified: Secondary | ICD-10-CM | POA: Diagnosis present

## 2015-09-18 DIAGNOSIS — G4700 Insomnia, unspecified: Secondary | ICD-10-CM | POA: Insufficient documentation

## 2015-09-18 DIAGNOSIS — M4310 Spondylolisthesis, site unspecified: Secondary | ICD-10-CM

## 2015-09-18 DIAGNOSIS — F4321 Adjustment disorder with depressed mood: Secondary | ICD-10-CM | POA: Insufficient documentation

## 2015-09-18 MED ORDER — LACTATED RINGERS IV SOLN: 1000.0000 mL | INTRAVENOUS | Status: AC

## 2015-09-18 MED ORDER — MIDAZOLAM HCL 5 MG/5ML IJ SOLN: 5.0000 mg | Freq: Once | INTRAMUSCULAR | Status: DC

## 2015-09-18 MED ORDER — ROPIVACAINE HCL 2 MG/ML IJ SOLN
INTRAMUSCULAR | Status: AC
  Administered 2015-09-18: 12:00:00
  Filled 2015-09-18: qty 20

## 2015-09-18 MED ORDER — FENTANYL CITRATE (PF) 100 MCG/2ML IJ SOLN: 100.0000 ug | Freq: Once | INTRAMUSCULAR | Status: DC

## 2015-09-18 MED ORDER — TRIAMCINOLONE ACETONIDE 40 MG/ML IJ SUSP
INTRAMUSCULAR | Status: AC
  Administered 2015-09-18: 12:00:00
  Filled 2015-09-18: qty 2

## 2015-09-18 MED ORDER — MIDAZOLAM HCL 5 MG/5ML IJ SOLN
INTRAMUSCULAR | Status: AC
  Administered 2015-09-18: 1 mg via INTRAVENOUS
  Filled 2015-09-18: qty 5

## 2015-09-18 MED ORDER — LIDOCAINE HCL (PF) 1 % IJ SOLN: 10.0000 mL | Freq: Once | INTRAMUSCULAR | Status: DC

## 2015-09-18 MED ORDER — TRIAMCINOLONE ACETONIDE 40 MG/ML IJ SUSP: 40.0000 mg | Freq: Once | INTRAMUSCULAR | Status: DC

## 2015-09-18 MED ORDER — ROPIVACAINE HCL 2 MG/ML IJ SOLN: 9.0000 mL | Freq: Once | INTRAMUSCULAR | Status: DC

## 2015-09-18 MED ORDER — FENTANYL CITRATE (PF) 100 MCG/2ML IJ SOLN
INTRAMUSCULAR | Status: AC
  Administered 2015-09-18: 50 ug via INTRAVENOUS
  Filled 2015-09-18: qty 2

## 2015-09-18 NOTE — Progress Notes (Signed)
Patient's Name: Karen Lewis MRN: [id number] DOB: [birth year] DOS: 09/18/2015  Primary Reason(s) for Visit: Interventional Pain Management Treatment. CC: Back Pain and Hip Pain   Pre-Procedure Assessment:  Ms. Laguardia is a 45 y.o. year old, female patient, seen today for interventional treatment. She has History of asthma; Aggrieved; Binge eating disorder; Fibromyalgia; Anxiety, generalized; Insomnia, persistent; Depression, major, recurrent, moderate ([place]); Allergic state; Airway hyperreactivity; Chicken pox; Clinical depression; Dermatitis, eczematoid; Gout; Cephalalgia; BP (high blood pressure); HLD (hyperlipidemia); Headache, migraine; Combined fat and carbohydrate induced hyperlipemia; Multinodular goiter; Obstructive apnea; Bilateral polycystic ovarian syndrome; Apnea, sleep; Disease of thyroid gland; Disease with a predominantly sexual mode of transmission; Avitaminosis D; History of surgical procedure; Type 2 diabetes mellitus ([place]); Chronic pain; Long term current use of opiate analgesic; Long term prescription opiate use; Opiate use; Encounter for therapeutic drug level monitoring; Opiate dependence ([place]); Adjustment disorder with depressed mood; Essential (primary) hypertension; Cannot sleep; Moderate episode of recurrent major depressive disorder ([place]); Goiter, nontoxic, multinodular; Chronic knee pain (Bilateral) (Location of Primary Source of Pain); Osteoarthritis of knee (Bilateral); Chronic low back pain (R>L); Lumbar facet syndrome (Bilateral) (R>L); Osteoarthrosis; Grade 1 (1.4 cm) Anterolisthesis of L4 over L5; Chronic hip pain (Bilateral) (Left total hip replacement); History of total left hip replacement; History of methicillin resistant staphylococcus aureus (MRSA); Vitamin D deficiency; History of bariatric surgery; Hypomagnesemia; Morbid obesity with BMI of 70 and over, adult ([place]) (71.75 on 08/27/2015); Congenital spondylolisthesis; History of methicillin resistant Staphylococcus aureus  infection; LBP (low back pain); Arthritis, degenerative; Arthralgia of hip; Gonalgia; History of artificial joint; Primary osteoarthritis of both knees; Opioid dependence ([place]); Drug abuse, opioid type; and Lumbar spondylosis on her problem list.. Her primarily concern today is the Back Pain and Hip Pain   Today's Pain Score: 6 , clinically she looks like a 2-3/10. Reported level of pain is incompatible with clinical obrservations. This may be secondary to a possible lack of understanding on how the pain scale works. Pain Type: Chronic pain Pain Descriptors / Indicators: Sharp, Aching, Dull Pain Frequency: Constant  Date of Last Visit: 08/27/15 Service Provided on Last Visit: Procedure  Verification of the correct person, correct site (including marking of site), and correct procedure were performed and confirmed by the patient.  Today's Vitals   09/18/15 1100  PainSc: 6   Calculated BMI: There is no weight on file to calculate BMI. Allergies: She is allergic to bactrim; aspirin; ciprofloxacin; clindamycin; motrin; sulfa antibiotics; and nsaids.. Primary Diagnosis: Facet syndrome, lumbar [M54.5]  Procedure:  Type: Diagnostic Medial Branch Facet Block Region: Lumbar Level: L2, L3, L4, L5, & S1 Medial Branch Level(s) Laterality: Bilateral  Indications: Spondylosis, Lumbosacral Region Lumbosacral Spine Pain  Consent: Secured. Under the influence of no sedatives a written informed consent was obtained, after having provided information on the risks and possible complications. To fulfill our ethical and legal obligations, as recommended by the American Medical Association's Code of Ethics, we have provided information to the patient about our clinical impression; the nature and purpose of the treatment or procedure; the risks, benefits, and possible complications of the intervention; alternatives; the risk(s) and benefit(s) of the alternative treatment(s) or procedure(s); and the risk(s)  and benefit(s) of doing nothing. The patient was provided information about the risks and possible complications associated with the procedure. In the case of spinal procedures these may include, but are not limited to, failure to achieve desired goals, infection, bleeding, organ or nerve damage, allergic reactions, paralysis, and death. In addition, the patient  was informed that Medicine is not an exact science; therefore, there is also the possibility of unforeseen risks and possible complications that may result in a catastrophic outcome. The patient indicated having understood very clearly. We have given the patient no guarantees and we have made no promises. Enough time was given to the patient to ask questions, all of which were answered to the patient's satisfaction.  Pre-Procedure Preparation: Safety Precautions: Allergies reviewed. Appropriate site, procedure, and patient were confirmed by following the Joint Commission's Universal Protocol (UP.01.01.01), in the form of a "Time Out". The patient was asked to confirm marked site and procedure, before commencing. The patient was asked about blood thinners, or active infections, both of which were denied. Patient was assessed for positional comfort and all pressure points were checked before starting procedure. Monitoring:  As per clinic protocol. Infection Control Precautions: Sterile technique used. Standard Universal Precautions were taken as recommended by the Department of [facility] for Disease Control and Prevention (CDC). Standard pre-surgical skin prep was conducted. Respiratory hygiene and cough etiquette was practiced. Hand hygiene observed. Safe injection practices and needle disposal techniques followed. SDV (single dose vial) medications used. Medications properly checked for expiration dates and contaminants. Personal protective equipment (PPE) used: Sterile double glove technique. Radiation resistant gloves. Sterile surgical  gloves.  Anesthesia, Analgesia, Anxiolysis: Type: Moderate (Conscious) Sedation & Local Anesthesia. Meaningful verbal contact was maintained, with the patient at all times during the procedure. Local Anesthetic(s): Lidocaine 1% IV Access: Secured Sedation: Meaningful verbal contact was maintained at all times during the procedure. Indication(s):Anxiolysis and Analgesia.  Description of Procedure Process:  Time-out: "Time-out" completed before starting procedure, as per protocol. Position: Prone Target Area: For Lumbar Facet blocks, the target is the groove formed by the junction of the transverse process and superior articular process. For the L5 dorsal ramus, the target is the notch between superior articular process and sacral ala. For the S1 dorsal ramus, the target is the superior and lateral edge of the posterior S1 Sacral foramen. Approach: Paramedial approach. Area Prepped: Entire Posterior Lumbosacral Region Prepping solution: ChloraPrep (2% chlorhexidine gluconate and 70% isopropyl alcohol) Safety Precautions: Aspiration looking for blood return was conducted prior to all injections. At no point did we inject any substances, as a needle was being advanced. No attempts were made at seeking any paresthesias. Safe injection practices and needle disposal techniques used. Medications properly checked for expiration dates. SDV (single dose vial) medications used. Description of the Procedure: Protocol guidelines were followed. The patient was placed in position over the fluoroscopy table. The target area was identified and the area prepped in the usual manner. Skin desensitized using vapocoolant spray. Skin & deeper tissues infiltrated with local anesthetic. Appropriate amount of time allowed to pass for local anesthetics to take effect. The procedure needle was introduced through the skin, ipsilateral to the reported pain, and advanced to the target area. Employing the "Medial Branch Technique",  the needles were advanced to the angle made by the superior and medial portion of the transverse process, and the lateral and inferior portion of the superior articulating process of the targeted vertebral bodies. This area is known as "Burton's Eye" or the "Eye of the [place] Dog". A procedure needle was introduced through the skin, and this time advanced to the angle made by the superior and medial border of the sacral ala, and the lateral border of the S1 vertebral body. This last needle was later repositioned at the superior and lateral border of the  posterior S1 foramen. Negative aspiration confirmed. Solution injected in intermittent fashion, asking for systemic symptoms every 0.5cc of injectate. The needles were then removed and the area cleansed, making sure to leave some of the prepping solution back to take advantage of its long term bactericidal properties. EBL: None Materials & Medications Used:  Needle(s) Used: 22g - 7" Spinal Needle(s) Medications Administered today: Ms. Feldkamp had no medications administered during this visit.Please see chart orders for dosing details.  Imaging Guidance:  Type of Imaging Technique: Fluoroscopy Guidance (Spinal) Indication(s): Assistance in needle guidance and placement for procedures requiring needle placement in or near specific anatomical locations not easily accessible without such assistance. Exposure Time: Please see nurses notes. Contrast: None required. Fluoroscopic Guidance: I was personally present in the fluoroscopy suite, where the patient was placed in position for the procedure, over the fluoroscopy-compatible table. Fluoroscopy was manipulated, using "Tunnel Vision Technique", to obtain the best possible view of the target area, on the affected side. Parallax error was corrected before commencing the procedure. A "direction-depth-direction" technique was used to introduce the needle under continuous pulsed fluoroscopic guidance. Once the target  was reached, antero-posterior, oblique, and lateral fluoroscopic projection views were taken to confirm needle placement in all planes. Permanently recorded images stored by scanning into EMR. Interpretation: Intraoperative imaging interpretation by performing Physician. Adequate needle placement confirmed. Adequate needle placement confirmed in AP, lateral, & Oblique Views. No contrast injected.  Antibiotics:  Type:  Antibiotics Given (last 72 hours)    None      Indication(s): No indications identified.  Post-operative Assessment:  Complications: No immediate post-treatment complications were observed. Disposition: Return to clinic for follow-up evaluation. The patient tolerated the entire procedure well. A repeat set of vitals were taken after the procedure and the patient was kept under observation following institutional policy, for this procedure. Post-procedural neurological assessment was performed, showing return to baseline, prior to discharge. The patient was discharged home, once institutional criteria were met. The patient was provided with post-procedure discharge instructions, including a section on how to identify potential problems. Should any problems arise concerning this procedure, the patient was given instructions to immediately contact us, at any time, without hesitation. In any case, we plan to contact the patient by telephone for a follow-up status report regarding this interventional procedure. Comments:  No additional relevant information.  Primary Care Physician: Idelle Crouch, MD Location: [facility] Note by: Kathlen Brunswick. [name], M.D, DABA, DABAPM, DABPM, DABIPP, FIPP   Illustration of the posterior view of the lumbar spine and the posterior neural structures. Laminae of L2 through S1 are labeled. DPRL5, dorsal primary ramus of L5; DPRS1, dorsal primary ramus of S1; DPR3, dorsal primary ramus of L3; FJ, facet (zygapophyseal) joint  L3-L4; I, inferior articular process of L4; LB1, lateral branch of dorsal primary ramus of L1; IAB, inferior articular branches from L3 medial branch (supplies L4-L5 facet joint); IBP, intermediate branch plexus; MB3, medial branch of dorsal primary ramus of L3; NR3, third lumbar nerve root; S, superior articular process of L5; SAB, superior articular branches from L4 (supplies L4-5 facet joint also); TP3, transverse process of L3.  Disclaimer:  Medicine is not an Chief Strategy Officer. The only guarantee in medicine is that nothing is guaranteed. It is important to note that the decision to proceed with this intervention was based on the information collected from the patient. The Data and conclusions were drawn from the patient's questionnaire, the interview, and the physical examination. Because the information was  provided in large part by the patient, it cannot be guaranteed that it has not been purposely or unconsciously manipulated. Every effort has been made to obtain as much relevant data as possible for this evaluation. It is important to note that the conclusions that lead to this procedure are derived in large part from the available data. Always take into account that the treatment will also be dependent on availability of resources and existing treatment guidelines, considered by other Pain Management Practitioners as being common knowledge and practice, at the time of the intervention. For Medico-Legal purposes, it is also important to point out that variation in procedural techniques and pharmacological choices are the acceptable norm. The indications, contraindications, technique, and results of the above procedure should only be interpreted and judged by a Board-Certified Interventional Pain Specialist with extensive familiarity and expertise in the same exact procedure and technique. Attempts at providing opinions without similar or greater experience and expertise than that of the treating physician  will be considered as inappropriate and unethical, and shall result in a formal complaint to the state medical board and applicable specialty societies.

## 2015-09-18 NOTE — Patient Instructions (Signed)
Please complete the Post Procedure Diary Pain Management Discharge Instructions  General Discharge Instructions :  If you need to reach your doctor call: Monday-Friday 8:00 am - 4:00 pm at [phone] or toll free [phone].  After clinic hours [phone] to have operator reach doctor.  Bring all of your medication bottles to all your appointments in the pain clinic.  To cancel or reschedule your appointment with Pain Management please remember to call 24 hours in advance to avoid a fee.  Refer to the educational materials which you have been given on: General Risks, I had my Procedure. Discharge Instructions, Post Sedation.  Post Procedure Instructions:  The drugs you were given will stay in your system until tomorrow, so for the next 24 hours you should not drive, make any legal decisions or drink any alcoholic beverages.  You may eat anything you prefer, but it is better to start with liquids then soups and crackers, and gradually work up to solid foods.  Please notify your doctor immediately if you have any unusual bleeding, trouble breathing or pain that is not related to your normal pain.  Depending on the type of procedure that was done, some parts of your body may feel week and/or numb.  This usually clears up by tonight or the next day.  Walk with the use of an assistive device or accompanied by an adult for the 24 hours.  You may use ice on the affected area for the first 24 hours.  Put ice in a Ziploc bag and cover with a towel and place against area 15 minutes on 15 minutes off.  You may switch to heat after 24 hours. 

## 2015-09-18 NOTE — Progress Notes (Signed)
Safety precautions to be maintained throughout the outpatient stay will include: orient to surroundings, keep bed in low position, maintain call bell within reach at all times, provide assistance with transfer out of bed and ambulation.  

## 2015-09-19 ENCOUNTER — Telehealth: Payer: Self-pay

## 2015-09-19 NOTE — Telephone Encounter (Signed)
Procedure call back.  States she is doing wonderful.     Marland KitchenMarland Kitchen

## 2015-09-19 NOTE — Telephone Encounter (Signed)
Procedure call back..the patient states she is doing wonderful!

## 2015-09-30 DIAGNOSIS — F341 Dysthymic disorder: Secondary | ICD-10-CM | POA: Diagnosis not present

## 2015-10-01 ENCOUNTER — Ambulatory Visit: Payer: Commercial Managed Care - HMO | Admitting: Psychiatry

## 2015-10-01 DIAGNOSIS — G43719 Chronic migraine without aura, intractable, without status migrainosus: Secondary | ICD-10-CM | POA: Diagnosis not present

## 2015-10-01 DIAGNOSIS — Z049 Encounter for examination and observation for unspecified reason: Secondary | ICD-10-CM | POA: Diagnosis not present

## 2015-10-02 ENCOUNTER — Ambulatory Visit: Payer: Commercial Managed Care - HMO | Attending: Pain Medicine | Admitting: Pain Medicine

## 2015-10-02 ENCOUNTER — Other Ambulatory Visit: Payer: Self-pay | Admitting: Pain Medicine

## 2015-10-02 VITALS — BP 150/91 | HR 88 | Temp 98.5°F | Resp 16 | Ht 64.0 in | Wt >= 6400 oz

## 2015-10-02 DIAGNOSIS — G43909 Migraine, unspecified, not intractable, without status migrainosus: Secondary | ICD-10-CM | POA: Diagnosis not present

## 2015-10-02 DIAGNOSIS — F331 Major depressive disorder, recurrent, moderate: Secondary | ICD-10-CM | POA: Diagnosis not present

## 2015-10-02 DIAGNOSIS — Z9889 Other specified postprocedural states: Secondary | ICD-10-CM | POA: Diagnosis not present

## 2015-10-02 DIAGNOSIS — M797 Fibromyalgia: Secondary | ICD-10-CM | POA: Diagnosis not present

## 2015-10-02 DIAGNOSIS — Z5181 Encounter for therapeutic drug level monitoring: Secondary | ICD-10-CM | POA: Diagnosis not present

## 2015-10-02 DIAGNOSIS — M25562 Pain in left knee: Secondary | ICD-10-CM | POA: Diagnosis not present

## 2015-10-02 DIAGNOSIS — R51 Headache: Secondary | ICD-10-CM | POA: Diagnosis not present

## 2015-10-02 DIAGNOSIS — M25561 Pain in right knee: Secondary | ICD-10-CM | POA: Diagnosis not present

## 2015-10-02 DIAGNOSIS — L309 Dermatitis, unspecified: Secondary | ICD-10-CM | POA: Insufficient documentation

## 2015-10-02 DIAGNOSIS — M170 Bilateral primary osteoarthritis of knee: Secondary | ICD-10-CM | POA: Diagnosis not present

## 2015-10-02 DIAGNOSIS — E119 Type 2 diabetes mellitus without complications: Secondary | ICD-10-CM | POA: Diagnosis not present

## 2015-10-02 DIAGNOSIS — M160 Bilateral primary osteoarthritis of hip: Secondary | ICD-10-CM

## 2015-10-02 DIAGNOSIS — F1190 Opioid use, unspecified, uncomplicated: Secondary | ICD-10-CM | POA: Insufficient documentation

## 2015-10-02 DIAGNOSIS — F411 Generalized anxiety disorder: Secondary | ICD-10-CM | POA: Diagnosis not present

## 2015-10-02 DIAGNOSIS — I10 Essential (primary) hypertension: Secondary | ICD-10-CM | POA: Diagnosis not present

## 2015-10-02 DIAGNOSIS — M549 Dorsalgia, unspecified: Secondary | ICD-10-CM | POA: Diagnosis present

## 2015-10-02 DIAGNOSIS — M25559 Pain in unspecified hip: Secondary | ICD-10-CM | POA: Diagnosis present

## 2015-10-02 DIAGNOSIS — Z96642 Presence of left artificial hip joint: Secondary | ICD-10-CM | POA: Diagnosis not present

## 2015-10-02 DIAGNOSIS — E042 Nontoxic multinodular goiter: Secondary | ICD-10-CM | POA: Insufficient documentation

## 2015-10-02 DIAGNOSIS — E7801 Familial hypercholesterolemia: Secondary | ICD-10-CM | POA: Diagnosis not present

## 2015-10-02 DIAGNOSIS — E559 Vitamin D deficiency, unspecified: Secondary | ICD-10-CM | POA: Diagnosis not present

## 2015-10-02 DIAGNOSIS — Z79899 Other long term (current) drug therapy: Secondary | ICD-10-CM | POA: Diagnosis not present

## 2015-10-02 DIAGNOSIS — E282 Polycystic ovarian syndrome: Secondary | ICD-10-CM | POA: Diagnosis not present

## 2015-10-02 DIAGNOSIS — G4700 Insomnia, unspecified: Secondary | ICD-10-CM | POA: Insufficient documentation

## 2015-10-02 DIAGNOSIS — M25569 Pain in unspecified knee: Secondary | ICD-10-CM | POA: Diagnosis present

## 2015-10-02 DIAGNOSIS — G8929 Other chronic pain: Secondary | ICD-10-CM | POA: Diagnosis not present

## 2015-10-02 DIAGNOSIS — F4321 Adjustment disorder with depressed mood: Secondary | ICD-10-CM | POA: Diagnosis not present

## 2015-10-02 DIAGNOSIS — Z9884 Bariatric surgery status: Secondary | ICD-10-CM | POA: Diagnosis not present

## 2015-10-02 DIAGNOSIS — M109 Gout, unspecified: Secondary | ICD-10-CM | POA: Diagnosis not present

## 2015-10-02 DIAGNOSIS — Z79891 Long term (current) use of opiate analgesic: Secondary | ICD-10-CM | POA: Diagnosis not present

## 2015-10-02 DIAGNOSIS — J45909 Unspecified asthma, uncomplicated: Secondary | ICD-10-CM | POA: Insufficient documentation

## 2015-10-02 DIAGNOSIS — M47816 Spondylosis without myelopathy or radiculopathy, lumbar region: Secondary | ICD-10-CM | POA: Diagnosis not present

## 2015-10-02 DIAGNOSIS — Z13220 Encounter for screening for lipoid disorders: Secondary | ICD-10-CM | POA: Diagnosis not present

## 2015-10-02 DIAGNOSIS — F5081 Binge eating disorder: Secondary | ICD-10-CM | POA: Diagnosis not present

## 2015-10-02 DIAGNOSIS — M545 Low back pain: Secondary | ICD-10-CM
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
## 2015-10-02 NOTE — Patient Instructions (Signed)
Sacroiliac (SI) Joint Injection Patient Information  Description: The sacroiliac joint connects the scrum (very low back and tailbone) to the ilium (a pelvic bone which also forms half of the hip joint).  Normally this joint experiences very little motion.  When this joint becomes inflamed or unstable low back and or hip and pelvis pain may result.  Injection of this joint with local anesthetics (numbing medicines) and steroids can provide diagnostic information and reduce pain.  This injection is performed with the aid of x-ray guidance into the tailbone area while you are lying on your stomach.   You may experience an electrical sensation down the leg while this is being done.  You may also experience numbness.  We also may ask if we are reproducing your normal pain during the injection.  Conditions which may be treated SI injection:   Low back, buttock, hip or leg pain  Preparation for the Injection:  1. Do not eat any solid food or dairy products within 6 hours of your appointment.  2. You may drink clear liquids up to 2 hours before appointment.  Clear liquids include water, black coffee, juice or soda.  No milk or cream please. 3. You may take your regular medications, including pain medications with a sip of water before your appointment.  Diabetics should hold regular insulin (if take separately) and take 1/2 normal NPH dose the morning of the procedure.  Carry some sugar containing items with you to your appointment. 4. A driver must accompany you and be prepared to drive you home after your procedure. 5. Bring all of your current medications with you. 6. An IV may be inserted and sedation may be given at the discretion of the physician. 7. A blood pressure cuff, EKG and other monitors will often be applied during the procedure.  Some patients may need to have extra oxygen administered for a short period.  8. You will be asked to provide medical information, including your allergies,  prior to the procedure.  We must know immediately if you are taking blood thinners (like Coumadin/Warfarin) or if you are allergic to IV iodine contrast (dye).  We must know if you could possible be pregnant.  Possible side effects:   Bleeding from needle site  Infection (rare, may require surgery)  Nerve injury (rare)  Numbness & tingling (temporary)  A brief convulsion or seizure  Light-headedness (temporary)  Pain at injection site (several days)  Decreased blood pressure (temporary)  Weakness in the leg (temporary)   Call if you experience:   New onset weakness or numbness of an extremity below the injection site that last more than 8 hours.  Hives or difficulty breathing ( go to the emergency room)  Inflammation or drainage at the injection site  Any new symptoms which are concerning to you  Please note:  Although the local anesthetic injected can often make your back/ hip/ buttock/ leg feel good for several hours after the injections, the pain will likely return.  It takes 3-7 days for steroids to work in the sacroiliac area.  You may not notice any pain relief for at least that one week.  If effective, we will often do a series of three injections spaced 3-6 weeks apart to maximally decrease your pain.  After the initial series, we generally will wait some months before a repeat injection of the same type.  If you have any questions, please call [phone] [facility]  Knee Injection A  knee injection is a procedure to get medicine into your knee joint. Your health care provider puts a needle into the joint and injects medicine with an attached syringe. The injected medicine may relieve the pain, swelling, and stiffness of arthritis. The injected medicine may also help to lubricate and cushion your knee joint. You may need more than one injection. LET [facility] CARE PROVIDER KNOW ABOUT: 2. Any allergies you have. 3. All  medicines you are taking, including vitamins, herbs, eye drops, creams, and over-the-counter medicines. 4. Previous problems you or members of your family have had with the use of anesthetics. 5. Any blood disorders you have. 6. Previous surgeries you have had. 7. Any medical conditions you may have. RISKS AND COMPLICATIONS Generally, this is a safe procedure. However, problems may occur, including: 9. Infection. 10. Bleeding. 11. Worsening symptoms. 12. Damage to the area around your knee. 13. Allergic reaction to any of the medicines. 14. Skin reactions from repeated injections. BEFORE THE PROCEDURE 10. Ask your health care provider about changing or stopping your regular medicines. This is especially important if you are taking diabetes medicines or blood thinners. 11. Plan to have someone take you home after the procedure. PROCEDURE 5. You will sit or lie down in a position for your knee to be treated. 6. The skin over your kneecap will be cleaned with a germ-killing solution (antiseptic). 7. You will be given a medicine that numbs the area (local anesthetic). You may feel some stinging. 8. After your knee becomes numb, you will have a second injection. This is the medicine. This needle is carefully placed between your kneecap and your knee. The medicine is injected into the joint space. 9. At the end of the procedure, the needle will be removed. 10. A bandage (dressing) may be placed over the injection site. The procedure may vary among health care providers and hospitals. AFTER THE PROCEDURE  You may have to move your knee through its full range of motion. This helps to get all of the medicine into your joint space.  Your blood pressure, heart rate, breathing rate, and blood oxygen level will be monitored often until the medicines you were given have worn off.  You will be watched to make sure that you do not have a reaction to the injected medicine.   This information is not  intended to replace advice given to you by your health care provider. Make sure you discuss any questions you have with your health care provider.   Document Released: 11/21/2006 Document Revised: 09/20/2014 Document Reviewed: 07/10/2014 Elsevier Interactive Patient Education 2016 [employer] Pain Management Discharge Instructions  General Discharge Instructions :  If you need to reach your doctor call: Monday-Friday 8:00 am - 4:00 pm at [phone] or toll free [phone].  After clinic hours [phone] to have operator reach doctor.  Bring all of your medication bottles to all your appointments in the pain clinic.  To cancel or reschedule your appointment with Pain Management please remember to call 24 hours in advance to avoid a fee.  Refer to the educational materials which you have been given on: General Risks, I had my Procedure. Discharge Instructions, Post Sedation.  Post Procedure Instructions:  The drugs you were given will stay in your system until tomorrow, so for the next 24 hours you should not drive, make any legal decisions or drink any alcoholic beverages.  You may eat anything you prefer, but it is better to start with liquids then soups and  crackers, and gradually work up to solid foods.  Please notify your doctor immediately if you have any unusual bleeding, trouble breathing or pain that is not related to your normal pain.  Depending on the type of procedure that was done, some parts of your body may feel week and/or numb.  This usually clears up by tonight or the next day.  Walk with the use of an assistive device or accompanied by an adult for the 24 hours.  You may use ice on the affected area for the first 24 hours.  Put ice in a Ziploc bag and cover with a towel and place against area 15 minutes on 15 minutes off.  You may switch to heat after 24 hours.

## 2015-10-06 DIAGNOSIS — M791 Myalgia: Secondary | ICD-10-CM | POA: Diagnosis not present

## 2015-10-06 DIAGNOSIS — G43719 Chronic migraine without aura, intractable, without status migrainosus: Secondary | ICD-10-CM | POA: Diagnosis not present

## 2015-10-06 DIAGNOSIS — M542 Cervicalgia: Secondary | ICD-10-CM | POA: Diagnosis not present

## 2015-10-06 DIAGNOSIS — R51 Headache: Secondary | ICD-10-CM | POA: Diagnosis not present

## 2015-10-06 DIAGNOSIS — Z049 Encounter for examination and observation for unspecified reason: Secondary | ICD-10-CM | POA: Diagnosis not present

## 2015-10-10 LAB — TOXASSURE SELECT 13 (MW), URINE: PDF: 0

## 2015-10-13 DIAGNOSIS — R070 Pain in throat: Secondary | ICD-10-CM | POA: Diagnosis not present

## 2015-10-13 DIAGNOSIS — F1920 Other psychoactive substance dependence, uncomplicated: Secondary | ICD-10-CM | POA: Insufficient documentation

## 2015-10-13 DIAGNOSIS — E782 Mixed hyperlipidemia: Secondary | ICD-10-CM | POA: Diagnosis not present

## 2015-10-13 DIAGNOSIS — J4531 Mild persistent asthma with (acute) exacerbation: Secondary | ICD-10-CM | POA: Diagnosis not present

## 2015-10-13 DIAGNOSIS — F324 Major depressive disorder, single episode, in partial remission: Secondary | ICD-10-CM | POA: Diagnosis not present

## 2015-10-13 DIAGNOSIS — B370 Candidal stomatitis: Secondary | ICD-10-CM | POA: Diagnosis not present

## 2015-10-13 DIAGNOSIS — F331 Major depressive disorder, recurrent, moderate: Secondary | ICD-10-CM | POA: Diagnosis not present

## 2015-10-13 DIAGNOSIS — Z9109 Other allergy status, other than to drugs and biological substances: Secondary | ICD-10-CM | POA: Diagnosis not present

## 2015-10-17 DIAGNOSIS — J45909 Unspecified asthma, uncomplicated: Secondary | ICD-10-CM | POA: Diagnosis not present

## 2015-10-29 ENCOUNTER — Encounter: Payer: Self-pay | Admitting: Psychiatry

## 2015-10-29 ENCOUNTER — Ambulatory Visit: Payer: Commercial Managed Care - HMO | Admitting: Psychiatry

## 2015-10-29 DIAGNOSIS — E559 Vitamin D deficiency, unspecified: Secondary | ICD-10-CM | POA: Diagnosis not present

## 2015-10-29 DIAGNOSIS — E119 Type 2 diabetes mellitus without complications: Secondary | ICD-10-CM | POA: Diagnosis not present

## 2015-10-29 DIAGNOSIS — E042 Nontoxic multinodular goiter: Secondary | ICD-10-CM | POA: Diagnosis not present

## 2015-10-29 DIAGNOSIS — E282 Polycystic ovarian syndrome: Secondary | ICD-10-CM | POA: Diagnosis not present

## 2015-10-29 MED ORDER — QUETIAPINE FUMARATE 100 MG PO TABS: 100.0000 mg | ORAL_TABLET | Freq: Every day | ORAL | Status: DC

## 2015-10-29 MED ORDER — SERTRALINE HCL 100 MG PO TABS: 200.0000 mg | ORAL_TABLET | Freq: Every day | ORAL | Status: DC

## 2015-10-29 MED ORDER — LISDEXAMFETAMINE DIMESYLATE 70 MG PO CAPS: 70.0000 mg | ORAL_CAPSULE | ORAL | Status: DC

## 2015-10-29 NOTE — Progress Notes (Signed)
Patient ID: Karen Lewis, female   DOB: May 02, 1971, 46 y.o.   MRN: [id number] [facility] MD/PA/NP OP Progress Note  10/29/2015 11:42 AM SYDELLE CERVONI  MRN:  [id number]  Subjective:  Patient is a 45 yo female with MDD and binge eating disorder previously seen by Dr.williams. This is the first visit for this clinician with the patient. Reports that her mood is more stable since starting the seroquel, less intense moods and mood reactivity. States she is sleeping ok. Her binging is reported as a daily occurrence with mostly potato chips. States that she is thinking about her future and where she would like to see herself in the future. States she makes some poor choices with her friendships due to loneliness. Denies any anxiety or suicidal thoughts. States main issue is loneliness. Able to enjoy activities such crocheting , watching movies and her cats. She is seeing a therapist regularly.   Chief Complaint: Okay Chief Complaint    Follow-up; Medication Refill     Visit Diagnosis:   No diagnosis found.  Past Medical History:  Past Medical History  Diagnosis Date  . Anginal pain ([place])   . Hypertension   . Dysrhythmia   . Asthma   . Sleep apnea   . Thyroid nodule bilateral  . Diabetes mellitus without complication ([place])   . Depression   . Anxiety   . GERD (gastroesophageal reflux disease)   . History of hiatal hernia   . Headache   . Neuromuscular disorder ([place])   . Fibromyalgia   . Arthritis   . Anemia   . Obesity   . H/O cardiovascular disorder 03/10/2015  . H/O thyroid disease 03/10/2015  . H/O surgical procedure 12/05/2012    Overview:  LSG ([facility] - April 2013)    . Arthralgia of hip 07/29/2015  . Arthritis, degenerative 07/29/2015  . Gonalgia 07/29/2015    Overview:  Overview:  The patient has had bilateral intra-articular Hyalgan injections done on 07/16/2014 and although she seems to do well with this type of therapy, apparently her insurance company does not want to pay for they  Hyalgan. On 11/27/2014 the patient underwent a bilateral genicular nerve block with excellent results. On 01/28/2015 she had a right knee genicular radiofrequency ablatio  . Cephalalgia 07/25/2014  . History of artificial joint 07/29/2015  . LBP (low back pain) 07/29/2015  . Primary osteoarthritis of both knees 07/29/2015    Past Surgical History  Procedure Laterality Date  . Laparoscopic partial gastrectomy    . Shoulder arthroscopy Right   . Joint replacement Left hip  . Carpal tunnel release Bilateral   . Diagnostic laparoscopy    . Cholecystectomy    . Trigger finger release Right    Family History:  Family History  Problem Relation Age of Onset  . Anxiety disorder Mother   . Depression Mother   . Alcohol abuse Mother   . Diabetes Mother   . Hypertension Mother   . Kidney cancer Mother   . Sleep apnea Mother   . Alcohol abuse Father   . Anxiety disorder Father   . Depression Father   . Post-traumatic stress disorder Father   . Kidney failure Father   . COPD Father   . Diabetes Father   . Hypertension Father   . Sleep apnea Father   . Depression Brother   . Diabetes Brother   . Hypertension Brother   . Sleep apnea Brother    Social History:  Social History   Social  History  . Marital Status: Single    Spouse Name: N/A  . Number of Children: N/A  . Years of Education: N/A   Social History Main Topics  . Smoking status: Never Smoker   . Smokeless tobacco: Never Used  . Alcohol Use: No  . Drug Use: No  . Sexual Activity: Not Currently   Other Topics Concern  . None   Social History Narrative   Additional History:   Assessment:   Musculoskeletal: Strength & Muscle Tone: within normal limits Gait & Station: normal Patient leans: N/A  Psychiatric Specialty Exam: HPI  Review of Systems  Psychiatric/Behavioral: Negative for depression, suicidal ideas, hallucinations, memory loss and substance abuse. The patient is not nervous/anxious and does not  have insomnia.   All other systems reviewed and are negative.   Blood pressure 142/90, pulse 121, temperature 97.3 F (36.3 C), temperature source Tympanic, height 5\' 4"  (1.626 m), weight 422 lb (191.418 kg), SpO2 96 %.Body mass index is 72.4 kg/(m^2).  General Appearance: Well Groomed  Eye Contact:  Good  Speech:  Normal Rate  Volume:  Normal  Mood:  good  Affect:  Fairly bright and able to laugh to most the appointment, tearful when I discussed my departure, then joking at the end of the appointment and smiling   Thought Process:  Linear and Logical  Orientation:  Full (Time, Place, and Person)  Thought Content:  Negative  Suicidal Thoughts:  No  Homicidal Thoughts:  No  Memory:  Immediate;   Good Recent;   Good Remote;   Good  Judgement:  Good  Insight:  Good  Psychomotor Activity:  Negative  Concentration:  Good  Recall:  Good  Fund of Knowledge: Good  Language: Good  Akathisia:  Negative  Handed:  Right  AIMS (if indicated):  Normal, done 05/21/15  Assets:  Communication Skills Desire for Improvement  ADL's:  Intact  Cognition: WNL  Sleep:  fair   Is the patient at risk to self?  No. Has the patient been a risk to self in the past 6 months?  No. Has the patient been a risk to self within the distant past?  No. Is the patient a risk to others?  No. Has the patient been a risk to others in the past 6 months?  No. Has the patient been a risk to others within the distant past?  No.  Current Medications: Current Outpatient Prescriptions  Medication Sig Dispense Refill  . acyclovir (ZOVIRAX) 800 MG tablet Take 800 mg by mouth 2 (two) times daily.     Marland Kitchen albuterol (PROVENTIL HFA;VENTOLIN HFA) 108 (90 BASE) MCG/ACT inhaler Inhale 2 puffs into the lungs every 6 (six) hours as needed for wheezing or shortness of breath.    Marland Kitchen amitriptyline (ELAVIL) 100 MG tablet Take 100 mg by mouth at bedtime.     Marland Kitchen atorvastatin (LIPITOR) 40 MG tablet Take 40 mg by mouth daily at 6 PM.     .  azelastine (ASTELIN) 0.1 % nasal spray     . butalbital-acetaminophen-caffeine (FIORICET, ESGIC) 50-325-40 MG tablet as needed.    . cetirizine (ZYRTEC) 5 MG tablet Take 5 mg by mouth daily.     . clonazePAM (KLONOPIN) 0.5 MG tablet Take 1 tablet (0.5 mg total) by mouth 2 (two) times daily as needed for anxiety. 60 tablet 2  . ergocalciferol (VITAMIN D2) 50000 UNITS capsule Take 50,000 Units by mouth once a week.    . Fluticasone-Salmeterol (ADVAIR DISKUS) 500-50 MCG/DOSE AEPB  Inhale 1 puff into the lungs 2 (two) times daily.     Marland Kitchen gabapentin (NEURONTIN) 600 MG tablet 1,200 mg 2 (two) times daily.     Marland Kitchen gabapentin (NEURONTIN) 800 MG tablet 2,400 mg 2 (two) times daily.     Marland Kitchen ketoconazole (NIZORAL) 2 % cream APPLY AS DIRECTED TO AFFECTED AREA TWICE DAILY AS NEEDED    . lisdexamfetamine (VYVANSE) 70 MG capsule Take 1 capsule (70 mg total) by mouth every morning. 30 capsule 0  . magnesium oxide (MAG-OX) 400 MG tablet Take 1 tablet (400 mg total) by mouth 2 (two) times daily. 60 tablet 2  . meclizine (ANTIVERT) 25 MG tablet Take 1 tablet (25 mg total) by mouth 3 (three) times daily as needed for dizziness. 30 tablet 0  . medroxyPROGESTERone (DEPO-PROVERA) 150 MG/ML injection     . metFORMIN (GLUCOPHAGE) 1000 MG tablet Take 1,000 mg by mouth 2 (two) times daily with a meal.     . metoprolol (LOPRESSOR) 50 MG tablet Take 50 mg by mouth 2 (two) times daily.     . montelukast (SINGULAIR) 10 MG tablet Take 10 mg by mouth at bedtime.     . Multiple Vitamin (MULTIVITAMIN WITH MINERALS) TABS tablet Take 2 tablets by mouth daily.    Marland Kitchen omeprazole (PRILOSEC) 40 MG capsule Take 40 mg by mouth 2 (two) times daily.     Marland Kitchen oxyCODONE-acetaminophen (PERCOCET/ROXICET) 5-325 MG tablet Take 1 tablet by mouth every 6 (six) hours as needed for moderate pain or severe pain. 120 tablet 0  . QUEtiapine (SEROQUEL) 100 MG tablet Take 1.5 tablets (150 mg total) by mouth at bedtime. 45 tablet 2  . sertraline (ZOLOFT) 100 MG  tablet Take 2 tablets (200 mg total) by mouth daily. 60 tablet 2  . SPIRIVA HANDIHALER 18 MCG inhalation capsule Reported on 10/02/2015    . spironolactone (ALDACTONE) 25 MG tablet Take 37.5 mg by mouth 2 (two) times daily.     Marland Kitchen tiZANidine (ZANAFLEX) 4 MG tablet     . triamcinolone (NASACORT) 55 MCG/ACT AERO nasal inhaler 2 sprays by Nasal route 2 (two) times daily.    . TRUE METRIX BLOOD GLUCOSE TEST test strip     . zolpidem (AMBIEN) 10 MG tablet Take 1 tablet (10 mg total) by mouth at bedtime as needed for sleep. 30 tablet 2   No current facility-administered medications for this visit.    Medical Decision Making:  Established Problem, Worsening (2), Review of Medication Regimen & Side Effects (2) and Review of New Medication or Change in Dosage (2)  Treatment Plan Summary:Medication management and Plan   Major depressive disorder Continue Sertraline at 200 mg daily.  Continue on the Seroquel 150 mg at bedtime.   GAD-Discussed addicting nature of benzodiazepines and substituting Vistaril  Insomnia- She will continue her Ambien 10 mg as needed.   Binge Eating Disorder-Continue  Vyvanse 70 mg.  She'll follow up in 1 month. I have discussed my departure from the clinic in February 2017. She's been encouraged call any questions or concerns prior to her next appointment.  Deundra Furber 10/29/2015, 11:42 AM

## 2015-10-31 DIAGNOSIS — K219 Gastro-esophageal reflux disease without esophagitis: Secondary | ICD-10-CM | POA: Diagnosis not present

## 2015-10-31 DIAGNOSIS — E042 Nontoxic multinodular goiter: Secondary | ICD-10-CM | POA: Diagnosis not present

## 2015-10-31 DIAGNOSIS — R490 Dysphonia: Secondary | ICD-10-CM | POA: Diagnosis not present

## 2015-10-31 DIAGNOSIS — R1314 Dysphagia, pharyngoesophageal phase: Secondary | ICD-10-CM | POA: Diagnosis not present

## 2015-11-04 ENCOUNTER — Ambulatory Visit: Payer: Commercial Managed Care - HMO | Admitting: Pain Medicine

## 2015-11-06 ENCOUNTER — Encounter: Payer: Self-pay | Admitting: Pain Medicine

## 2015-11-06 ENCOUNTER — Ambulatory Visit (HOSPITAL_BASED_OUTPATIENT_CLINIC_OR_DEPARTMENT_OTHER): Payer: Commercial Managed Care - HMO | Admitting: Pain Medicine

## 2015-11-06 ENCOUNTER — Encounter
Admission: RE | Admit: 2015-11-06 | Discharge: 2015-11-06 | Disposition: A | Discharge status: Home / Self Care | Payer: Commercial Managed Care - HMO | Source: Ambulatory Visit | Attending: Otolaryngology | Admitting: Otolaryngology

## 2015-11-06 VITALS — BP 117/105 | HR 89 | Temp 100.0°F | Resp 20 | Ht 64.0 in | Wt >= 6400 oz

## 2015-11-06 DIAGNOSIS — M25561 Pain in right knee: Secondary | ICD-10-CM | POA: Insufficient documentation

## 2015-11-06 DIAGNOSIS — G8929 Other chronic pain: Secondary | ICD-10-CM | POA: Diagnosis not present

## 2015-11-06 DIAGNOSIS — M160 Bilateral primary osteoarthritis of hip: Secondary | ICD-10-CM | POA: Diagnosis not present

## 2015-11-06 DIAGNOSIS — Z888 Allergy status to other drugs, medicaments and biological substances status: Secondary | ICD-10-CM | POA: Insufficient documentation

## 2015-11-06 DIAGNOSIS — M25562 Pain in left knee: Secondary | ICD-10-CM | POA: Insufficient documentation

## 2015-11-06 DIAGNOSIS — M4316 Spondylolisthesis, lumbar region: Secondary | ICD-10-CM | POA: Insufficient documentation

## 2015-11-06 DIAGNOSIS — Z881 Allergy status to other antibiotic agents status: Secondary | ICD-10-CM | POA: Diagnosis not present

## 2015-11-06 DIAGNOSIS — Z96642 Presence of left artificial hip joint: Secondary | ICD-10-CM | POA: Diagnosis not present

## 2015-11-06 DIAGNOSIS — Z882 Allergy status to sulfonamides status: Secondary | ICD-10-CM | POA: Diagnosis not present

## 2015-11-06 DIAGNOSIS — Z886 Allergy status to analgesic agent status: Secondary | ICD-10-CM | POA: Insufficient documentation

## 2015-11-06 DIAGNOSIS — M797 Fibromyalgia: Secondary | ICD-10-CM | POA: Insufficient documentation

## 2015-11-06 DIAGNOSIS — M25559 Pain in unspecified hip: Secondary | ICD-10-CM | POA: Diagnosis not present

## 2015-11-06 DIAGNOSIS — M47816 Spondylosis without myelopathy or radiculopathy, lumbar region: Secondary | ICD-10-CM | POA: Insufficient documentation

## 2015-11-06 HISTORY — DX: Other psychoactive substance dependence, uncomplicated: F19.20

## 2015-11-06 HISTORY — DX: Dermatitis, unspecified: L30.9

## 2015-11-06 HISTORY — DX: Hypomagnesemia: E83.42

## 2015-11-06 HISTORY — DX: Hypothyroidism, unspecified: E03.9

## 2015-11-06 HISTORY — DX: Gout, unspecified: M10.9

## 2015-11-06 HISTORY — DX: Herpesviral infection, unspecified: B00.9

## 2015-11-06 HISTORY — DX: Polycystic ovarian syndrome: E28.2

## 2015-11-06 LAB — SURGICAL PCR SCREEN
MRSA, PCR: NEGATIVE
Staphylococcus aureus: NEGATIVE

## 2015-11-06 MED ORDER — LIDOCAINE HCL (PF) 1 % IJ SOLN
10.0000 mL | Freq: Once | INTRAMUSCULAR | Status: AC
  Administered 2015-11-06: 10 mL via SUBCUTANEOUS

## 2015-11-06 MED ORDER — ROPIVACAINE HCL 2 MG/ML IJ SOLN: 4.0000 mL | Freq: Once | INTRAMUSCULAR | Status: DC

## 2015-11-06 MED ORDER — IOHEXOL 180 MG/ML  SOLN
INTRAMUSCULAR | Status: AC
  Filled 2015-11-06: qty 20

## 2015-11-06 MED ORDER — METHYLPREDNISOLONE ACETATE 80 MG/ML IJ SUSP
INTRAMUSCULAR | Status: AC
  Administered 2015-11-06: 11:00:00
  Filled 2015-11-06: qty 1

## 2015-11-06 MED ORDER — METHYLPREDNISOLONE ACETATE 80 MG/ML IJ SUSP
80.0000 mg | Freq: Once | INTRAMUSCULAR | Status: AC
  Administered 2015-11-06: 80 mg via INTRA_ARTICULAR

## 2015-11-06 MED ORDER — IOHEXOL 180 MG/ML  SOLN: 10.0000 mL | Freq: Once | INTRAMUSCULAR | Status: DC | PRN

## 2015-11-06 MED ORDER — ROPIVACAINE HCL 2 MG/ML IJ SOLN
INTRAMUSCULAR | Status: AC
  Administered 2015-11-06: 11:00:00
  Filled 2015-11-06: qty 10

## 2015-11-06 NOTE — Progress Notes (Signed)
Safety precautions to be maintained throughout the outpatient stay will include: orient to surroundings, keep bed in low position, maintain call bell within reach at all times, provide assistance with transfer out of bed and ambulation.  

## 2015-11-06 NOTE — Patient Instructions (Signed)

## 2015-11-06 NOTE — Progress Notes (Signed)
Patient's Name: Karen Lewis MRN: [id number] DOB: July 09, 1971 DOS: 11/06/2015  Primary Reason(s) for Visit: Interventional Pain Management Treatment. CC: Hip Pain I did  Pre-Procedure Assessment:  [name] is a 45 y.o. year old, female patient, seen today for interventional treatment. 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; Avitaminosis D; History of surgical procedure; Type 2 diabetes mellitus ([place]); Chronic pain; Long term current use of opiate analgesic; Long term prescription opiate use; Opiate use; Encounter for therapeutic drug level monitoring; Opiate dependence ([place]); Adjustment disorder with depressed mood; Essential (primary) hypertension; Cannot sleep; Moderate episode of recurrent major depressive disorder ([place]); Goiter, nontoxic, multinodular; Chronic knee pain (Bilateral) (Location of Primary Source of Pain); Osteoarthritis of knee (Bilateral); Chronic low back pain (R>L); Lumbar facet syndrome (Bilateral) (R>L); Osteoarthrosis; Grade 1 (1.4 cm) Anterolisthesis of L4 over L5; Chronic hip pain (Bilateral) (Left total hip replacement); History of total left hip replacement; History of methicillin resistant staphylococcus aureus (MRSA); Vitamin D deficiency; History of bariatric surgery; Hypomagnesemia; Morbid obesity with BMI of 70 and over, adult ([place]) (71.75 on 08/27/2015); Congenital spondylolisthesis; History of methicillin resistant Staphylococcus aureus infection; History of  artificial joint; Lumbar spondylosis; Osteoarthritis of hips (Bilateral); Controlled drug dependence ([place]); Diabetes mellitus ([place]); Morbid obesity ([place]); and Opioid dependence ([place]) on her problem list.. Her primarily concern today is the Hip Pain   Today's Initial Pain Score: 6/10 Reported level of pain is incompatible with clinical obrservations. This may be secondary to a possible lack of understanding on how the pain scale works. Pain Type: Chronic pain Pain Location: Hip Pain Orientation: Right Pain Descriptors / Indicators: Aching, Sharp, Dull Pain Frequency: Constant  Post-procedure Pain Score: 6   Date of Last Visit: 10/02/15 Service Provided on Last Visit: Evaluation  Verification of the correct person, correct site (including marking of site), and correct procedure were performed and confirmed by the patient.  Today's Vitals   11/06/15 1006 11/06/15 1007  BP: 144/86   Pulse: 96   Temp: 100 F (37.8 C)   Resp: 18   Height: '5\' 4"'  (1.626 m)   Weight: 419 lb (190.057 kg)   SpO2: 99%   PainSc: 6  6   PainLoc: Hip   Calculated BMI: Body mass index is 71.89 kg/(m^2). Allergies: She is allergic to xolair; bactrim; aspirin; ciprofloxacin; clindamycin; motrin; sulfa antibiotics; and nsaids.. Primary Diagnosis: Primary osteoarthritis of both hips [M16.0]  Procedure:  Type: Diagnostic Intra-Articular Hip Injection Region:  Posterolateral hip joint area. Level: Lower pelvic and hip joint level. Laterality: Right-Sided  Indications: 1. Primary osteoarthritis of both hips   2. Chronic hip pain, unspecified laterality     In addition, [name] has Fibromyalgia; Chronic pain; Chronic knee pain (Bilateral) (Location of Primary Source of Pain); Osteoarthritis of knee (Bilateral); Chronic low back pain (R>L); Lumbar facet syndrome (Bilateral) (R>L); Osteoarthrosis; Grade 1 (1.4 cm) Anterolisthesis of L4 over L5; Chronic hip pain (Bilateral) (Left total hip replacement);  Hypomagnesemia; Lumbar spondylosis; and Osteoarthritis of hips (Bilateral) on her pertinent problem list.  Consent: Secured. Under the influence of no sedatives a written informed consent was obtained, after having provided information on the risks and possible  complications. To fulfill our ethical and legal obligations, as recommended by the American Medical Association's Code of Ethics, we have provided information to the patient about our clinical impression; the nature and purpose of the treatment or procedure; the risks, benefits, and possible complications of the intervention; alternatives; the risk(s) and benefit(s) of the alternative treatment(s) or procedure(s); and the risk(s) and benefit(s) of doing nothing. The patient was provided information about the risks and possible complications associated with the procedure. These include, but are not limited to, failure to achieve desired goals, infection, bleeding, organ or nerve damage, allergic reactions, paralysis, and death. In the case of intra- or periarticular procedures these may include, but are not limited to, failure to achieve desired goals, infection, bleeding (hemarthrosis), organ or nerve damage, allergic reactions, and death. In addition, the patient was informed that Medicine is not an exact science; therefore, there is also the possibility of unforeseen risks and possible complications that may result in a catastrophic outcome. The patient indicated having understood very clearly. We have given the patient no guarantees and we have made no promises. Enough time was given to the patient to ask questions, all of which were answered to the patient's satisfaction.  Pre-Procedure Preparation: Safety Precautions: Allergies reviewed. Appropriate site, procedure, and patient were confirmed by following the Joint Commission's Universal Protocol (UP.01.01.01), in the form of a "Time Out". The patient was asked to confirm marked site and procedure,  before commencing. The patient was asked about blood thinners, or active infections, both of which were denied. Patient was assessed for positional comfort and all pressure points were checked before starting procedure. Monitoring:  As per clinic protocol. Infection Control Precautions: Sterile technique used. Standard Universal Precautions were taken as recommended by the Department of [facility] for Disease Control and Prevention (CDC). Standard pre-surgical skin prep was conducted. Respiratory hygiene and cough etiquette was practiced. Hand hygiene observed. Safe injection practices and needle disposal techniques followed. SDV (single dose vial) medications used. Medications properly checked for expiration dates and contaminants. Personal protective equipment (PPE) used: Radiation resistant gloves.  Anesthesia, Analgesia, Anxiolysis: Type: Local Anesthesia Local Anesthetic: Lidocaine 1% Route: Infiltration ([place]/IM) IV Access: Declined Sedation: Declined  Indication(s): Analgesia    Description of Procedure Process:  Time-out: "Time-out" completed before starting procedure, as per protocol. Position: Prone Target Area: Superior aspect of the hip joint cavity, going thru the superior portion of the capsular ligament. Approach: Posterolateral approach. Area Prepped: Entire Posterolateral hip area. Prepping solution: ChloraPrep (2% chlorhexidine gluconate and 70% isopropyl alcohol) Safety Precautions: Aspiration looking for blood return was conducted prior to all injections. At no point did we inject any substances, as a needle was being advanced. No attempts were made at seeking any paresthesias. Safe injection practices and needle disposal techniques used. Medications properly checked for expiration dates. SDV (single dose vial) medications used.   Description of the Procedure: Protocol guidelines were followed. The patient was placed in position over the fluoroscopy table. The target  area was identified and the area prepped in the usual manner. Skin & deeper tissues infiltrated with local anesthetic. Appropriate amount of time allowed to pass for local anesthetics to take effect. The procedure needles were then advanced to the target area. Proper needle placement secured. Negative aspiration confirmed. Solution injected in intermittent fashion, asking for systemic symptoms every 0.5cc of injectate. The needles were then removed and the area cleansed, making sure to leave some of the prepping solution back to take advantage of its long  term bactericidal properties. EBL: Minimal Materials & Medications Used:  Needle(s) Used: 22g - 7" Spinal Needle(s) Solution Injected: 0.2% PF-Ropivacaine (48m) + SDV-DepoMedrol 80 mg/ml (121m Medications Administered today: Ms. ShLearnad no medications administered during this visit.Please see chart orders for dosing details.  Imaging Guidance:  Type of Imaging Technique: Fluoroscopy Guidance (Non-spinal) Indication(s): Assistance in needle guidance and placement for procedures requiring needle placement in or near specific anatomical locations not easily accessible without such assistance. Exposure Time: Please see nurses notes. Contrast: Before injecting any contrast, we confirmed that the patient did not have an allergy to iodine, shellfish, or radiological contrast. Once satisfactory needle placement was completed at the desired level, radiological contrast was injected. Injection was conducted under continuous fluoroscopic guidance. Injection of contrast accomplished without complications. See chart for type and volume of contrast used. Fluoroscopic Guidance: I was personally present in the fluoroscopy suite, where the patient was placed in position for the procedure, over the fluoroscopy-compatible table. Fluoroscopy was manipulated, using "Tunnel Vision Technique", to obtain the best possible view of the target area, on the affected side.  Parallax error was corrected before commencing the procedure. A "direction-depth-direction" technique was used to introduce the needle under continuous pulsed fluoroscopic guidance. Once the target was reached, antero-posterior, oblique, and lateral fluoroscopic projection views were taken to confirm needle placement in all planes. Permanently recorded images stored by scanning into EMR. Interpretation: Intraoperative imaging interpretation by performing Physician. Adequate needle placement confirmed. Appropriate spread of contrast to desired area. No evidence of afferent or efferent intravascular uptake. Permanent hardcopy images in multiple planes scanned into the patient's record.  Antibiotics:  Type:  Antibiotics Given (last 72 hours)    None      Indication(s): No indications identified or reported.  Post-operative Assessment:  Complications: No immediate post-treatment complications were observed. Disposition: The patient was discharged home, once institutional criteria were met. Return to clinic in 2 weeks for follow-up evaluation and interpretation of results. The patient tolerated the entire procedure well. A repeat set of vitals were taken after the procedure and the patient was kept under observation following institutional policy, for this type of procedure. Post-procedural neurological assessment was performed, showing return to baseline, prior to discharge. The patient was provided with post-procedure discharge instructions, including a section on how to identify potential problems. Should any problems arise concerning this procedure, the patient was given instructions to immediately contact usKoreaat any time, without hesitation. In any case, we plan to contact the patient by telephone for a follow-up status report regarding this interventional procedure. Comments:  No additional relevant information.  Medications administered during this visit: Ms. ShDipintoad no medications administered  during this visit.  Future Appointments Date Time Provider [place]2/23/2017 12:00 PM [facility] PAT1 [facility] None  11/24/2015 10:00 AM [facility]    Primary Care Physician: SPIdelle CrouchMD [facility] Note by: FrKathlen BrunswickNaDossie ArbourM.D, DABA, DABAPM, DABPM, DABIPP, FIPP  Disclaimer:  Medicine is not an exact science. The only guarantee in medicine is that nothing is guaranteed. It is important to note that the decision to proceed with this intervention was based on the information collected from the patient. The Data and conclusions were drawn from the patient's questionnaire, the interview, and the physical examination. Because the information was provided in large part by the patient, it cannot be guaranteed that it has not been purposely or unconsciously manipulated. Every effort has been made to obtain  as much relevant data as possible for this evaluation. It is important to note that the conclusions that lead to this procedure are derived in large part from the available data. Always take into account that the treatment will also be dependent on availability of resources and existing treatment guidelines, considered by other Pain Management Practitioners as being common knowledge and practice, at the time of the intervention. For Medico-Legal purposes, it is also important to point out that variation in procedural techniques and pharmacological choices are the acceptable norm. The indications, contraindications, technique, and results of the above procedure should only be interpreted and judged by a Board-Certified Interventional Pain Specialist with extensive familiarity and expertise in the same exact procedure and technique. Attempts at providing opinions without similar or greater experience and expertise than that of the treating physician will be considered as inappropriate and unethical, and shall result in a  formal complaint to the state medical board and applicable specialty societies.

## 2015-11-06 NOTE — Pre-Procedure Instructions (Signed)
AS REQUESTED BY DR Brush FAXED TO DR Doy Hutching AND DR Katherina Right NO ANSWER AT [facility] BUT RECEIVED FAX CONFIRMATION TO BOTH OFFICES

## 2015-11-06 NOTE — Patient Instructions (Signed)
  Your procedure is scheduled on: 11/12/15 Report to Day Surgery. [facility]OND FLOOR To find out your arrival time please call [phone] between 1PM - 3PM on 11/11/15.  Remember: Instructions that are not followed completely may result in serious medical risk, up to and including death, or upon the discretion of your surgeon and anesthesiologist your surgery may need to be rescheduled.    __X__ 1. Do not eat food or drink liquids after midnight. No gum chewing or hard candies.     __X__ 2. No Alcohol for 24 hours before or after surgery.   ____ 3. Bring all medications with you on the day of surgery if instructed.    ____ 4. Notify your doctor if there is any change in your medical condition     (cold, fever, infections).     Do not wear jewelry, make-up, hairpins, clips or nail polish.  Do not wear lotions, powders, or perfumes. You may wear deodorant.  Do not shave 48 hours prior to surgery. Men may shave face and neck.  Do not bring valuables to the hospital.    [facility] is not responsible for any belongings or valuables.               Contacts, dentures or bridgework may not be worn into surgery.  Leave your suitcase in the car. After surgery it may be brought to your room.  For patients admitted to the hospital, discharge time is determined by your                treatment team.   Patients discharged the day of surgery will not be allowed to drive home.   Please read over the following fact sheets that you were given:   Surgical Site Infection Prevention   __X__ Take these medicines the morning of surgery with A SIP OF WATER:    1. METOPROLOL  2. GABAPENTIN  3. OMEPRAZOLE  4. CLONAZEPAM  5. MAG OX  6.  ____ Fleet Enema (as directed)   ____ Use CHG Soap as directed  __X__ Use inhalers on the day of surgery PLUS DO [place]  __X__ Stop metformin 2 days prior to surgery    ____ Take 1/2 of usual insulin dose the night before surgery and none on the  morning of surgery.   ____ Stop Coumadin/Plavix/aspirin on   ____ Stop Anti-inflammatories on    ____ Stop supplements until after surgery.    ____ Bring C-Pap to the hospital.

## 2015-11-07 ENCOUNTER — Telehealth: Payer: Self-pay

## 2015-11-07 NOTE — Telephone Encounter (Signed)
Post procedure call.  States she is sore, but doing OK.  Instructed to put heat on it today and give the steroids time to take effect.

## 2015-11-10 NOTE — Pre-Procedure Instructions (Signed)
CLEARED BY DR Doy Hutching 11/06/15

## 2015-11-12 ENCOUNTER — Ambulatory Visit: Payer: Commercial Managed Care - HMO | Admitting: Anesthesiology

## 2015-11-12 ENCOUNTER — Observation Stay
Admission: RE | Admit: 2015-11-12 | Discharge: 2015-11-13 | Disposition: A | Discharge status: Home / Self Care | Payer: Commercial Managed Care - HMO | Source: Ambulatory Visit | Attending: Otolaryngology | Admitting: Otolaryngology

## 2015-11-12 ENCOUNTER — Encounter
Admission: RE | Disposition: A | Discharge status: Home / Self Care | Payer: Self-pay | Source: Ambulatory Visit | Attending: Otolaryngology

## 2015-11-12 DIAGNOSIS — Z825 Family history of asthma and other chronic lower respiratory diseases: Secondary | ICD-10-CM | POA: Insufficient documentation

## 2015-11-12 DIAGNOSIS — Z9884 Bariatric surgery status: Secondary | ICD-10-CM | POA: Insufficient documentation

## 2015-11-12 DIAGNOSIS — M797 Fibromyalgia: Secondary | ICD-10-CM | POA: Insufficient documentation

## 2015-11-12 DIAGNOSIS — I10 Essential (primary) hypertension: Secondary | ICD-10-CM | POA: Insufficient documentation

## 2015-11-12 DIAGNOSIS — Z79899 Other long term (current) drug therapy: Secondary | ICD-10-CM | POA: Diagnosis not present

## 2015-11-12 DIAGNOSIS — E042 Nontoxic multinodular goiter: Secondary | ICD-10-CM | POA: Diagnosis not present

## 2015-11-12 DIAGNOSIS — Z8489 Family history of other specified conditions: Secondary | ICD-10-CM | POA: Insufficient documentation

## 2015-11-12 DIAGNOSIS — Z794 Long term (current) use of insulin: Secondary | ICD-10-CM | POA: Diagnosis not present

## 2015-11-12 DIAGNOSIS — Z809 Family history of malignant neoplasm, unspecified: Secondary | ICD-10-CM | POA: Insufficient documentation

## 2015-11-12 DIAGNOSIS — Z8261 Family history of arthritis: Secondary | ICD-10-CM | POA: Diagnosis not present

## 2015-11-12 DIAGNOSIS — Z8249 Family history of ischemic heart disease and other diseases of the circulatory system: Secondary | ICD-10-CM | POA: Diagnosis not present

## 2015-11-12 DIAGNOSIS — M25559 Pain in unspecified hip: Secondary | ICD-10-CM | POA: Diagnosis not present

## 2015-11-12 DIAGNOSIS — R51 Headache: Secondary | ICD-10-CM | POA: Insufficient documentation

## 2015-11-12 DIAGNOSIS — F418 Other specified anxiety disorders: Secondary | ICD-10-CM | POA: Diagnosis not present

## 2015-11-12 DIAGNOSIS — J45909 Unspecified asthma, uncomplicated: Secondary | ICD-10-CM | POA: Diagnosis not present

## 2015-11-12 DIAGNOSIS — Z882 Allergy status to sulfonamides status: Secondary | ICD-10-CM | POA: Insufficient documentation

## 2015-11-12 DIAGNOSIS — E040 Nontoxic diffuse goiter: Secondary | ICD-10-CM | POA: Diagnosis not present

## 2015-11-12 DIAGNOSIS — E041 Nontoxic single thyroid nodule: Secondary | ICD-10-CM | POA: Diagnosis not present

## 2015-11-12 DIAGNOSIS — Z7951 Long term (current) use of inhaled steroids: Secondary | ICD-10-CM | POA: Diagnosis not present

## 2015-11-12 DIAGNOSIS — E119 Type 2 diabetes mellitus without complications: Secondary | ICD-10-CM | POA: Diagnosis not present

## 2015-11-12 DIAGNOSIS — Z833 Family history of diabetes mellitus: Secondary | ICD-10-CM | POA: Insufficient documentation

## 2015-11-12 DIAGNOSIS — D649 Anemia, unspecified: Secondary | ICD-10-CM | POA: Diagnosis not present

## 2015-11-12 DIAGNOSIS — Z6841 Body Mass Index (BMI) 40.0 and over, adult: Secondary | ICD-10-CM | POA: Insufficient documentation

## 2015-11-12 DIAGNOSIS — K449 Diaphragmatic hernia without obstruction or gangrene: Secondary | ICD-10-CM | POA: Diagnosis not present

## 2015-11-12 DIAGNOSIS — G4730 Sleep apnea, unspecified: Secondary | ICD-10-CM | POA: Insufficient documentation

## 2015-11-12 DIAGNOSIS — K219 Gastro-esophageal reflux disease without esophagitis: Secondary | ICD-10-CM | POA: Insufficient documentation

## 2015-11-12 DIAGNOSIS — Z888 Allergy status to other drugs, medicaments and biological substances status: Secondary | ICD-10-CM | POA: Insufficient documentation

## 2015-11-12 DIAGNOSIS — E049 Nontoxic goiter, unspecified: Secondary | ICD-10-CM | POA: Insufficient documentation

## 2015-11-12 HISTORY — PX: THYROIDECTOMY: SHX17

## 2015-11-12 LAB — GLUCOSE, CAPILLARY
GLUCOSE-CAPILLARY: 208 mg/dL — AB (ref 65–99)
GLUCOSE-CAPILLARY: 221 mg/dL — AB (ref 65–99)
GLUCOSE-CAPILLARY: 159 mg/dL — AB (ref 65–99)
Glucose-Capillary: 168 mg/dL — ABNORMAL HIGH (ref 65–99)
Glucose-Capillary: 200 mg/dL — ABNORMAL HIGH (ref 65–99)

## 2015-11-12 LAB — POCT I-STAT 4, (NA,K, GLUC, HGB,HCT)
Glucose, Bld: 168 mg/dL — ABNORMAL HIGH (ref 65–99)
HEMATOCRIT: 39 % (ref 36.0–46.0)
HEMOGLOBIN: 13.3 g/dL (ref 12.0–15.0)
Potassium: 4.2 mmol/L (ref 3.5–5.1)
Sodium: 138 mmol/L (ref 135–145)

## 2015-11-12 LAB — CBC
HEMATOCRIT: 38 % (ref 35.0–47.0)
Hemoglobin: 12.4 g/dL (ref 12.0–16.0)
MCH: 25.8 pg — AB (ref 26.0–34.0)
MCHC: 32.6 g/dL (ref 32.0–36.0)
MCV: 79.1 fL — AB (ref 80.0–100.0)
PLATELETS: 337 10*3/uL (ref 150–440)
RBC: 4.81 MIL/uL (ref 3.80–5.20)
RDW: 16.3 % — ABNORMAL HIGH (ref 11.5–14.5)
WBC: 14.8 10*3/uL — ABNORMAL HIGH (ref 3.6–11.0)

## 2015-11-12 LAB — CREATININE, SERUM
Creatinine, Ser: 1.02 mg/dL — ABNORMAL HIGH (ref 0.44–1.00)
GFR calc Af Amer: >60 mL/min (ref >60)
GFR calc non Af Amer: >60 mL/min (ref >60)

## 2015-11-12 LAB — CALCIUM: CALCIUM: 8.6 mg/dL — AB (ref 8.9–10.3)

## 2015-11-12 LAB — POCT PREGNANCY, URINE: PREG TEST UR: NEGATIVE

## 2015-11-12 SURGERY — THYROIDECTOMY
Anesthesia: General

## 2015-11-12 MED ORDER — ONDANSETRON HCL 4 MG/2ML IJ SOLN
INTRAMUSCULAR | Status: DC | PRN
  Administered 2015-11-12: 4 mg via INTRAVENOUS

## 2015-11-12 MED ORDER — FLUTICASONE PROPIONATE 50 MCG/ACT NA SUSP
2.0000 | Freq: Every day | NASAL | Status: DC
  Administered 2015-11-13: 2 via NASAL
  Filled 2015-11-12: qty 16

## 2015-11-12 MED ORDER — ONDANSETRON HCL 4 MG PO TABS: 4.0000 mg | ORAL_TABLET | ORAL | Status: DC | PRN

## 2015-11-12 MED ORDER — POTASSIUM CHLORIDE IN NACL 20-0.9 MEQ/L-% IV SOLN
INTRAVENOUS | Status: DC
  Administered 2015-11-12: 15:00:00 via INTRAVENOUS
  Filled 2015-11-12 (×4): qty 1000

## 2015-11-12 MED ORDER — METOPROLOL TARTRATE 50 MG PO TABS
50.0000 mg | ORAL_TABLET | Freq: Two times a day (BID) | ORAL | Status: DC
Start: 2015-11-12 — End: 2015-11-13
  Administered 2015-11-12 – 2015-11-13 (×2): 50 mg via ORAL
  Filled 2015-11-12 (×2): qty 1

## 2015-11-12 MED ORDER — FENTANYL CITRATE (PF) 100 MCG/2ML IJ SOLN
INTRAMUSCULAR | Status: DC | PRN
  Administered 2015-11-12 (×3): 50 ug via INTRAVENOUS

## 2015-11-12 MED ORDER — PANTOPRAZOLE SODIUM 40 MG PO TBEC
40.0000 mg | DELAYED_RELEASE_TABLET | Freq: Two times a day (BID) | ORAL | Status: DC
  Administered 2015-11-12 – 2015-11-13 (×3): 40 mg via ORAL
  Filled 2015-11-12 (×3): qty 1

## 2015-11-12 MED ORDER — BACITRACIN ZINC 500 UNIT/GM EX OINT
TOPICAL_OINTMENT | CUTANEOUS | Status: AC
  Filled 2015-11-12: qty 28.35

## 2015-11-12 MED ORDER — LIDOCAINE-EPINEPHRINE (PF) 1 %-1:200000 IJ SOLN
INTRAMUSCULAR | Status: DC | PRN
  Administered 2015-11-12: 4 mL

## 2015-11-12 MED ORDER — ONDANSETRON HCL 4 MG/2ML IJ SOLN: 4.0000 mg | Freq: Once | INTRAMUSCULAR | Status: DC | PRN

## 2015-11-12 MED ORDER — OXYCODONE-ACETAMINOPHEN 5-325 MG/5ML PO SOLN: 10.0000 mL | ORAL | Status: DC | PRN

## 2015-11-12 MED ORDER — FENTANYL CITRATE (PF) 100 MCG/2ML IJ SOLN
INTRAMUSCULAR | Status: AC
Start: 2015-11-12 — End: 2015-11-12
  Administered 2015-11-12: 25 ug via INTRAVENOUS
  Filled 2015-11-12: qty 2

## 2015-11-12 MED ORDER — SPIRONOLACTONE 25 MG PO TABS
37.5000 mg | ORAL_TABLET | Freq: Two times a day (BID) | ORAL | Status: DC
  Administered 2015-11-12 – 2015-11-13 (×2): 37.5 mg via ORAL
  Filled 2015-11-12: qty 2
  Filled 2015-11-12: qty 1

## 2015-11-12 MED ORDER — SODIUM CHLORIDE 0.9 % IV SOLN
10000.0000 ug | INTRAVENOUS | Status: DC | PRN
  Administered 2015-11-12 (×3): 50 ug/min via INTRAVENOUS

## 2015-11-12 MED ORDER — MAGNESIUM OXIDE 400 (241.3 MG) MG PO TABS
400.0000 mg | ORAL_TABLET | Freq: Two times a day (BID) | ORAL | Status: DC
  Administered 2015-11-12 – 2015-11-13 (×3): 400 mg via ORAL
  Filled 2015-11-12 (×3): qty 1

## 2015-11-12 MED ORDER — LORATADINE 10 MG PO TABS
10.0000 mg | ORAL_TABLET | Freq: Every day | ORAL | Status: DC
  Administered 2015-11-12 – 2015-11-13 (×2): 10 mg via ORAL
  Filled 2015-11-12 (×2): qty 1

## 2015-11-12 MED ORDER — LIDOCAINE HCL (CARDIAC) 20 MG/ML IV SOLN
INTRAVENOUS | Status: DC | PRN
  Administered 2015-11-12: 40 mg via INTRAVENOUS

## 2015-11-12 MED ORDER — GABAPENTIN 600 MG PO TABS
3600.0000 mg | ORAL_TABLET | Freq: Two times a day (BID) | ORAL | Status: DC
  Administered 2015-11-12 – 2015-11-13 (×2): 3600 mg via ORAL
  Filled 2015-11-12 (×4): qty 6

## 2015-11-12 MED ORDER — TRIAMCINOLONE ACETONIDE 55 MCG/ACT NA AERO: 2.0000 | INHALATION_SPRAY | Freq: Two times a day (BID) | NASAL | Status: DC

## 2015-11-12 MED ORDER — DOCUSATE SODIUM 100 MG PO CAPS
100.0000 mg | ORAL_CAPSULE | Freq: Two times a day (BID) | ORAL | Status: DC
  Administered 2015-11-12 – 2015-11-13 (×3): 100 mg via ORAL
  Filled 2015-11-12 (×3): qty 1

## 2015-11-12 MED ORDER — CLONAZEPAM 0.5 MG PO TABS
0.5000 mg | ORAL_TABLET | Freq: Two times a day (BID) | ORAL | Status: DC | PRN
  Administered 2015-11-12: 0.5 mg via ORAL
  Filled 2015-11-12: qty 1

## 2015-11-12 MED ORDER — MOMETASONE FURO-FORMOTEROL FUM 200-5 MCG/ACT IN AERO
2.0000 | INHALATION_SPRAY | Freq: Two times a day (BID) | RESPIRATORY_TRACT | Status: DC
  Administered 2015-11-12 – 2015-11-13 (×3): 2 via RESPIRATORY_TRACT
  Filled 2015-11-12: qty 8.8

## 2015-11-12 MED ORDER — OXYCODONE-ACETAMINOPHEN 5-325 MG PO TABS
2.0000 | ORAL_TABLET | ORAL | Status: DC | PRN
  Administered 2015-11-12 – 2015-11-13 (×5): 2 via ORAL
  Filled 2015-11-12 (×5): qty 2

## 2015-11-12 MED ORDER — LIDOCAINE-EPINEPHRINE (PF) 1 %-1:200000 IJ SOLN
INTRAMUSCULAR | Status: AC
  Filled 2015-11-12: qty 30

## 2015-11-12 MED ORDER — MONTELUKAST SODIUM 10 MG PO TABS
10.0000 mg | ORAL_TABLET | Freq: Every day | ORAL | Status: DC
  Administered 2015-11-12: 10 mg via ORAL
  Filled 2015-11-12: qty 1

## 2015-11-12 MED ORDER — QUETIAPINE FUMARATE 100 MG PO TABS
100.0000 mg | ORAL_TABLET | Freq: Every day | ORAL | Status: DC
  Administered 2015-11-12: 100 mg via ORAL
  Filled 2015-11-12 (×2): qty 1

## 2015-11-12 MED ORDER — ROCURONIUM BROMIDE 100 MG/10ML IV SOLN
INTRAVENOUS | Status: DC | PRN
  Administered 2015-11-12: 5 mg via INTRAVENOUS

## 2015-11-12 MED ORDER — LEVOTHYROXINE SODIUM 75 MCG PO TABS
125.0000 ug | ORAL_TABLET | Freq: Every day | ORAL | Status: DC
  Administered 2015-11-13: 125 ug via ORAL
  Filled 2015-11-12: qty 1

## 2015-11-12 MED ORDER — PROPOFOL 10 MG/ML IV BOLUS
INTRAVENOUS | Status: DC | PRN
  Administered 2015-11-12: 200 mg via INTRAVENOUS

## 2015-11-12 MED ORDER — HEPARIN SODIUM (PORCINE) 5000 UNIT/ML IJ SOLN
5000.0000 [IU] | Freq: Three times a day (TID) | INTRAMUSCULAR | Status: DC
  Administered 2015-11-12 – 2015-11-13 (×2): 5000 [IU] via SUBCUTANEOUS
  Filled 2015-11-12 (×2): qty 1

## 2015-11-12 MED ORDER — BACITRACIN 500 UNIT/GM EX OINT
TOPICAL_OINTMENT | CUTANEOUS | Status: DC | PRN
  Administered 2015-11-12: 1 via TOPICAL

## 2015-11-12 MED ORDER — ACYCLOVIR 800 MG PO TABS
800.0000 mg | ORAL_TABLET | Freq: Two times a day (BID) | ORAL | Status: DC
  Filled 2015-11-12 (×2): qty 1

## 2015-11-12 MED ORDER — LISDEXAMFETAMINE DIMESYLATE 70 MG PO CAPS
70.0000 mg | ORAL_CAPSULE | ORAL | Status: DC
Start: 2015-11-13 — End: 2015-11-13
  Administered 2015-11-13: 70 mg via ORAL
  Filled 2015-11-12: qty 1

## 2015-11-12 MED ORDER — ZONISAMIDE 25 MG PO CAPS
75.0000 mg | ORAL_CAPSULE | Freq: Every day | ORAL | Status: DC
  Filled 2015-11-12 (×2): qty 3

## 2015-11-12 MED ORDER — REMIFENTANIL HCL 1 MG IV SOLR
INTRAVENOUS | Status: DC | PRN
  Administered 2015-11-12: .2 ug/kg/min via INTRAVENOUS

## 2015-11-12 MED ORDER — ALBUTEROL SULFATE (2.5 MG/3ML) 0.083% IN NEBU: 2.5000 mg | INHALATION_SOLUTION | Freq: Four times a day (QID) | RESPIRATORY_TRACT | Status: DC | PRN

## 2015-11-12 MED ORDER — FENTANYL CITRATE (PF) 100 MCG/2ML IJ SOLN
25.0000 ug | INTRAMUSCULAR | Status: AC | PRN
  Administered 2015-11-12 (×6): 25 ug via INTRAVENOUS

## 2015-11-12 MED ORDER — ALBUTEROL SULFATE HFA 108 (90 BASE) MCG/ACT IN AERS: 2.0000 | INHALATION_SPRAY | Freq: Four times a day (QID) | RESPIRATORY_TRACT | Status: DC | PRN

## 2015-11-12 MED ORDER — ACYCLOVIR 200 MG PO CAPS
800.0000 mg | ORAL_CAPSULE | Freq: Two times a day (BID) | ORAL | Status: DC
  Administered 2015-11-12 – 2015-11-13 (×3): 800 mg via ORAL
  Filled 2015-11-12 (×4): qty 4

## 2015-11-12 MED ORDER — AMITRIPTYLINE HCL 50 MG PO TABS
100.0000 mg | ORAL_TABLET | Freq: Every day | ORAL | Status: DC
  Administered 2015-11-12: 100 mg via ORAL
  Filled 2015-11-12 (×2): qty 2

## 2015-11-12 MED ORDER — SUCCINYLCHOLINE CHLORIDE 20 MG/ML IJ SOLN
INTRAMUSCULAR | Status: DC | PRN
  Administered 2015-11-12: 200 mg via INTRAVENOUS

## 2015-11-12 MED ORDER — PHENYLEPHRINE HCL 10 MG/ML IJ SOLN
INTRAMUSCULAR | Status: DC | PRN
  Administered 2015-11-12 (×2): 200 ug via INTRAVENOUS
  Administered 2015-11-12: 100 ug via INTRAVENOUS

## 2015-11-12 MED ORDER — ATORVASTATIN CALCIUM 20 MG PO TABS
40.0000 mg | ORAL_TABLET | Freq: Every day | ORAL | Status: DC
  Administered 2015-11-12: 40 mg via ORAL
  Filled 2015-11-12: qty 2

## 2015-11-12 MED ORDER — CALCIUM CARBONATE-VITAMIN D 500-200 MG-UNIT PO TABS
2.0000 | ORAL_TABLET | Freq: Two times a day (BID) | ORAL | Status: DC
  Administered 2015-11-12 – 2015-11-13 (×3): 2 via ORAL
  Filled 2015-11-12 (×3): qty 2

## 2015-11-12 MED ORDER — METFORMIN HCL 500 MG PO TABS
1000.0000 mg | ORAL_TABLET | Freq: Two times a day (BID) | ORAL | Status: DC
  Administered 2015-11-12 – 2015-11-13 (×2): 1000 mg via ORAL
  Filled 2015-11-12 (×2): qty 2

## 2015-11-12 MED ORDER — FENTANYL CITRATE (PF) 100 MCG/2ML IJ SOLN
INTRAMUSCULAR | Status: AC
  Administered 2015-11-12: 25 ug via INTRAVENOUS
  Filled 2015-11-12: qty 2

## 2015-11-12 MED ORDER — MORPHINE SULFATE (PF) 2 MG/ML IV SOLN
2.0000 mg | INTRAVENOUS | Status: DC | PRN
  Administered 2015-11-12: 2 mg via INTRAVENOUS
  Administered 2015-11-12: 4 mg via INTRAVENOUS
  Filled 2015-11-12: qty 2
  Filled 2015-11-12: qty 1

## 2015-11-12 MED ORDER — ONDANSETRON HCL 4 MG/2ML IJ SOLN: 4.0000 mg | INTRAMUSCULAR | Status: DC | PRN

## 2015-11-12 MED ORDER — SODIUM CHLORIDE 0.9 % IV SOLN
INTRAVENOUS | Status: DC
  Administered 2015-11-12 (×3): via INTRAVENOUS

## 2015-11-12 MED ORDER — ALBUTEROL SULFATE (2.5 MG/3ML) 0.083% IN NEBU
2.5000 mg | INHALATION_SOLUTION | Freq: Four times a day (QID) | RESPIRATORY_TRACT | Status: DC
  Administered 2015-11-12 – 2015-11-13 (×4): 2.5 mg via RESPIRATORY_TRACT
  Filled 2015-11-12 (×4): qty 3

## 2015-11-12 MED ORDER — MIDAZOLAM HCL 2 MG/2ML IJ SOLN
INTRAMUSCULAR | Status: DC | PRN
  Administered 2015-11-12: 2 mg via INTRAVENOUS

## 2015-11-12 MED ORDER — BACITRACIN ZINC 500 UNIT/GM EX OINT
1.0000 "application " | TOPICAL_OINTMENT | Freq: Three times a day (TID) | CUTANEOUS | Status: DC
  Administered 2015-11-12 – 2015-11-13 (×3): 1 via TOPICAL
  Filled 2015-11-12 (×3): qty 0.9

## 2015-11-12 MED ORDER — INSULIN ASPART 100 UNIT/ML ~~LOC~~ SOLN
0.0000 [IU] | Freq: Three times a day (TID) | SUBCUTANEOUS | Status: DC
  Administered 2015-11-13 (×2): 4 [IU] via SUBCUTANEOUS
  Filled 2015-11-12: qty 4
  Filled 2015-11-12: qty 7
  Filled 2015-11-12: qty 4

## 2015-11-12 MED ORDER — DEXAMETHASONE SODIUM PHOSPHATE 10 MG/ML IJ SOLN
INTRAMUSCULAR | Status: DC | PRN
  Administered 2015-11-12: 10 mg via INTRAVENOUS

## 2015-11-12 MED ORDER — SERTRALINE HCL 100 MG PO TABS
200.0000 mg | ORAL_TABLET | Freq: Every day | ORAL | Status: DC
  Administered 2015-11-12 – 2015-11-13 (×2): 200 mg via ORAL
  Filled 2015-11-12 (×2): qty 2

## 2015-11-12 SURGICAL SUPPLY — 34 items
BLADE SURG 15 STRL LF DISP TIS (BLADE) ×1 IMPLANT
BLADE SURG 15 STRL SS (BLADE) ×2
CANISTER SUCT 1200ML W/VALVE (MISCELLANEOUS) ×3 IMPLANT
CORD BIP STRL DISP 12FT (MISCELLANEOUS) ×3 IMPLANT
DRAIN TLS ROUND 10FR (DRAIN) ×6 IMPLANT
DRAPE MAG INST 16X20 L/F (DRAPES) ×3 IMPLANT
DRSG TEGADERM 2-3/8X2-3/4 SM (GAUZE/BANDAGES/DRESSINGS) ×3 IMPLANT
ELECT LARYNGEAL 6/7 (MISCELLANEOUS)
ELECT LARYNGEAL 8/9 (MISCELLANEOUS) ×3
ELECT REM PT RETURN 9FT ADLT (ELECTROSURGICAL) ×3
ELECTRODE LARYNGEAL 6/7 (MISCELLANEOUS) IMPLANT
ELECTRODE LARYNGEAL 8/9 (MISCELLANEOUS) ×1 IMPLANT
ELECTRODE REM PT RTRN 9FT ADLT (ELECTROSURGICAL) ×1 IMPLANT
FORCEPS JEWEL BIP 4-3/4 STR (INSTRUMENTS) ×3 IMPLANT
GLOVE BIO SURGEON STRL SZ7.5 (GLOVE) ×6 IMPLANT
GOWN STRL REUS W/ TWL LRG LVL3 (GOWN DISPOSABLE) ×2 IMPLANT
GOWN STRL REUS W/TWL LRG LVL3 (GOWN DISPOSABLE) ×4
HARMONIC SCALPEL FOCUS (MISCELLANEOUS) ×3 IMPLANT
HEMOSTAT SURGICEL 2X3 (HEMOSTASIS) ×3 IMPLANT
HOOK STAY BLUNT/RETRACTOR 5M (MISCELLANEOUS) ×3 IMPLANT
KIT RM TURNOVER STRD PROC AR (KITS) ×3 IMPLANT
LABEL OR SOLS (LABEL) IMPLANT
NS IRRIG 500ML POUR BTL (IV SOLUTION) ×3 IMPLANT
PACK HEAD/NECK (MISCELLANEOUS) ×3 IMPLANT
PROBE NEUROSIGN BIPOL (MISCELLANEOUS) ×1 IMPLANT
PROBE NEUROSIGN BIPOLAR (MISCELLANEOUS) ×2
SPONGE KITTNER 5P (MISCELLANEOUS) ×12 IMPLANT
SPONGE XRAY 4X4 16PLY STRL (MISCELLANEOUS) ×6 IMPLANT
SUT PROLENE 3 0 PS 2 (SUTURE) ×3 IMPLANT
SUT SILK 2 0 (SUTURE) ×2
SUT SILK 2 0 SH (SUTURE) ×3 IMPLANT
SUT SILK 2-0 18XBRD TIE 12 (SUTURE) ×1 IMPLANT
SUT VIC AB 4-0 RB1 18 (SUTURE) ×3 IMPLANT
SYSTEM CHEST DRAIN TLS 7FR (DRAIN) IMPLANT

## 2015-11-12 NOTE — OR Nursing (Signed)
Dr Andree Elk notified of K result.  Dr Richardson Landry at bedside updated on meds patient has taken. NO new orders.

## 2015-11-12 NOTE — Anesthesia Preprocedure Evaluation (Addendum)
Anesthesia Evaluation  Patient identified by MRN, date of birth, ID band Patient awake    Reviewed: Allergy & Precautions, H&P , NPO status , Patient's Chart, lab work & pertinent test results, reviewed documented beta blocker date and time   Airway Mallampati: II  TM Distance: >3 FB Neck ROM: full    Dental  (+) Teeth Intact   Pulmonary neg pulmonary ROS, asthma , sleep apnea ,    Pulmonary exam normal        Cardiovascular hypertension, (-) anginanegative cardio ROS Normal cardiovascular exam+ dysrhythmias  Rhythm:regular Rate:Normal     Neuro/Psych  Headaches, PSYCHIATRIC DISORDERS  Neuromuscular disease negative neurological ROS  negative psych ROS   GI/Hepatic negative GI ROS, Neg liver ROS, hiatal hernia, GERD  ,  Endo/Other  negative endocrine ROSdiabetesHypothyroidism Morbid obesity  Renal/GU negative Renal ROS  negative genitourinary   Musculoskeletal   Abdominal   Peds  Hematology negative hematology ROS (+) anemia ,   Anesthesia Other Findings Past Medical History:   Anginal pain (HCC)                                           Hypertension                                                 Dysrhythmia                                                  Asthma                                                       Sleep apnea                                                  Thyroid nodule                                  bilateral    Diabetes mellitus without complication (HCC)                 Depression                                                   Anxiety                                                      GERD (gastroesophageal reflux disease)  History of hiatal hernia                                     Headache                                                     Neuromuscular disorder (HCC)                                 Fibromyalgia                                                  Arthritis                                                    Anemia                                                       Obesity                                                      H/O cardiovascular disorder                     03/10/2015    H/O thyroid disease                             03/10/2015    H/O surgical procedure                          12/05/2012      Comment:Overview:  LSG ([facility] - April 2013)     Arthralgia of hip                               07/29/2015   Arthritis, degenerative                         07/29/2015   Gonalgia                                        07/29/2015     Comment:Overview:  Overview:  The patient has had               bilateral intra-articular Hyalgan injections               done on 07/16/2014 and  although she seems to do              well with this type of therapy, apparently her               insurance company does not want to pay for they              Hyalgan. On 11/27/2014 the patient underwent a               bilateral genicular nerve block with excellent               results. On 01/28/2015 she had a right knee               genicular radiofrequency ablatio   Cephalalgia                                     07/25/2014   History of artificial joint                     07/29/2015   LBP (low back pain)                             07/29/2015   Primary osteoarthritis of both knees            07/29/2015   Dependence on unknown drug ([place])                               Comment:multiplt controlled drug dependence   Hypomagnesemia                                               Gout                                                         Herpes                                                       PCOS (polycystic ovarian syndrome)                           Eczema                                                       Hypothyroidism                                             Past Surgical History:   LAPAROSCOPIC PARTIAL GASTRECTOMY  SHOULDER ARTHROSCOPY                            Right              JOINT REPLACEMENT                               Left hip          CARPAL TUNNEL RELEASE                           Bilateral              DIAGNOSTIC LAPAROSCOPY                                        CHOLECYSTECTOMY                                               TRIGGER FINGER RELEASE                          Right            BMI    Body Mass Index   71.88 kg/m 2     Reproductive/Obstetrics negative OB ROS                            Anesthesia Physical Anesthesia Plan  ASA: III  Anesthesia Plan: General ETT   Post-op Pain Management:    Induction:   Airway Management Planned:   Additional Equipment:   Intra-op Plan:   Post-operative Plan:   Informed Consent: I have reviewed the patients History and Physical, chart, labs and discussed the procedure including the risks, benefits and alternatives for the proposed anesthesia with the patient or authorized representative who has indicated his/her understanding and acceptance.   Dental Advisory Given  Plan Discussed with: CRNA  Anesthesia Plan Comments: (Cleared by Dr. Doy Hutching and considered Moderate risk stratification.  Pt describes a stable pulmonary and cardicac status.  JA)       Anesthesia Quick Evaluation

## 2015-11-12 NOTE — Op Note (Signed)
11/12/2015  10:25 AM    Althea Charon  HT:[id number]   Pre-Op Diagnosis:  THYROID MULTINODULAR GOITER Post-op Diagnosis: THYROID MULTINODULAR GOITER  Procedure:  TOTAL THYROIDECTOMY Surgeon:  Riley Nearing First Assistant: Margaretha Sheffield, MD   Anesthesia:  General endotracheal  EBL:   75 cc  Complications:  None  Findings:  multinodular goiter with bilateral large thyroid nodules   Procedure: After the patient was identified in holding and the procedure was reviewed.  The patient was taken to the operating room and with the patient in a comfortable supine position,  general endotracheal anesthesia was induced without difficulty.  A nerve monitor on the endotracheal tube was visualized to be between the cords at the time of intubation. A proper time-out was performed, confirming the operative site and procedure.  The patient was placed on a shoulder roll and position. Skin was injected with 1% lidocaine with epinephrine 1-200,000. The patient was then prepped and draped in the usual sterile fashion. A 15 blade was used to incise the skin carrying the incision down through the subcutaneous tissues. The platysma muscle was divided with the Bovie. The strap muscles were identified in the midline and divided in the midline, retracting them laterally. Small anterior jugular veins were either clamped and suture divided or divided with the Harmonic scalpel. The strap muscles were retracted laterally off of the capsule of the right lobe of the thyroid gland. This was then finger dissected to loosen loose attachments to the gland. Dissection proceeded superiorly, dissecting the superior pole of the gland. The superior pole vessels were divided with the Harmonic scalpel. A small parathyroid gland was visualized and preserved during this dissection. The superior pole was retracted inferiorly and dissection proceeded around the lateral aspect of the gland, dividing vascular attachments with the Harmonic  scalpel. The inferior pole was dissected, identifying a small structure consistent with the parathyroid, and the inferior pole vessels were divided with the Harmonic scalpel. Care was taken to divide all of these vessels right at the capsule of the gland. The gland was then retracted medially and delivered from the wound. Dissection along the trachea revealed the recurrent laryngeal nerve which stimulated properly. The gland was then dissected off of the trachea, dividing Berry's ligament with the nerve carefully protected.    Next the left lobe of the thyroid gland was dissected in the same fashion as described above. Once again the superior and inferior pole vessels were divided right at the capsule of the gland and structures consistent with parathyroid tissue were identified both superiorly and inferiorly. Retracting the gland medially the recurrent laryngeal nerve was identified and confirmed with the stimulator. This was then carefully protected as the gland was dissected away from the trachea and Berry's ligament divided. The left lobe was delivered and sent  as one specimen still attached to the right lobe of the thyroid gland. The superior pole of the left lobe was marked with a long stitch, in a short stitch used to mark the superior pole of the right lobe.  The wound was then irrigated and inspected for bleeding. #10 TLS drains were placed on either side of the trachea with the drains coming out through the skin just below the wound. The drains were secured with 4-0 Vicryl suture. Surgicel was placed on either side of the trachea to control minor oozing within the wound. The strap muscles were then reapproximated with 4-0 Vicryl suture. The platysma and subcutaneous tissues were also closed with 4-0 Vicryl  suture. The skin was closed with a 3-0 running subcuticular Prolene suture. The patient was then returned to the anesthesiologist for awakening. The patient was awakened and taken to recovery room  in good condition postoperatively.  The patient was then returned to the anesthesiologist in good condition for awakening. The patient was awakened and taken to the recovery room in good condition.   Disposition:   PACU then transferred to floor  Plan: The patient is to be admitted for observation and monitoring of serum calcium, with potential discharge tomorrow if serum calcium is stable overnight.  Riley Nearing 11/12/2015 10:25 AM

## 2015-11-12 NOTE — Anesthesia Procedure Notes (Addendum)
Procedure Name: Intubation Date/Time: 11/12/2015 7:50 AM Performed by: Allean Found Pre-anesthesia Checklist: Patient identified, Emergency Drugs available, Suction available, Patient being monitored and Timeout performed Patient Re-evaluated:Patient Re-evaluated prior to inductionOxygen Delivery Method: Circle system utilized Preoxygenation: Pre-oxygenation with 100% oxygen Intubation Type: IV induction and Rapid sequence Laryngoscope Size: Glidescope and 3 Grade View: Grade I Tube type: Oral Tube size: 7.0 mm Number of attempts: 1 Airway Equipment and Method: Stylet Placement Confirmation: ETT inserted through vocal cords under direct vision,  positive ETCO2 and breath sounds checked- equal and bilateral Secured at: 21 cm Tube secured with: Tape Dental Injury: Teeth and Oropharynx as per pre-operative assessment  Difficulty Due To: Difficulty was anticipated Comments: Easy intubation with shoulder roll, good neck extension, glidescope used, neuro sign laryngeal electrode applied to ett   Performed by: Oleg Oleson, Joelene Millin

## 2015-11-12 NOTE — Progress Notes (Signed)
Patient ID: Karen Lewis, female   DOB: August 10, 1971, 45 y.o.   MRN: [id number] [place], [place] [id number] [birth year] Riley Nearing, MD   SUBJECTIVE: This 45 y.o. year old female is status post TOTAL THYROIDECTOMY. Doing well, swallowing liquids and good pain control. Some cough with phlegm.   Medications:  Current Facility-Administered Medications  Medication Dose Route Frequency Provider Last Rate Last Dose  . 0.9 % NaCl with KCl 20 mEq/ L  infusion   Intravenous Continuous Clyde Canterbury, MD 100 mL/hr at 11/12/15 1450    . acyclovir (ZOVIRAX) 200 MG capsule 800 mg  800 mg Oral BID Clyde Canterbury, MD   800 mg at 11/12/15 1441  . albuterol (PROVENTIL) (2.5 MG/3ML) 0.083% nebulizer solution 2.5 mg  2.5 mg Nebulization Q6H Clyde Canterbury, MD      . amitriptyline (ELAVIL) tablet 100 mg  100 mg Oral QHS Clyde Canterbury, MD      . atorvastatin (LIPITOR) tablet 40 mg  40 mg Oral q1800 Clyde Canterbury, MD      . bacitracin ointment 1 application  1 application Topical 3 times per day Clyde Canterbury, MD   1 application at 78/29/56 1443  . calcium-vitamin D (OSCAL WITH D) 500-200 MG-UNIT per tablet 2 tablet  2 tablet Oral BID Clyde Canterbury, MD   2 tablet at 11/12/15 1442  . clonazePAM (KLONOPIN) tablet 0.5 mg  0.5 mg Oral BID PRN Clyde Canterbury, MD      . docusate sodium (COLACE) capsule 100 mg  100 mg Oral BID Clyde Canterbury, MD   100 mg at 11/12/15 1443  . fluticasone (FLONASE) 50 MCG/ACT nasal spray 2 spray  2 spray Each Nare Daily Clyde Canterbury, MD   2 spray at 11/12/15 1444  . gabapentin (NEURONTIN) capsule 3,600 mg  3,600 mg Oral BID Clyde Canterbury, MD      . heparin injection 5,000 Units  5,000 Units Subcutaneous 3 times per day Clyde Canterbury, MD      . insulin aspart (novoLOG) injection 0-20 Units  0-20 Units Subcutaneous TID WC Clyde Canterbury, MD   0 Units at 11/12/15 1200  . [START ON 11/13/2015] levothyroxine (SYNTHROID, LEVOTHROID) tablet 125 mcg  125 mcg Oral QAC breakfast Clyde Canterbury, MD      . Derrill Memo ON 11/13/2015]  lisdexamfetamine (VYVANSE) capsule 70 mg  70 mg Oral Carlisle Cater, MD      . loratadine (CLARITIN) tablet 10 mg  10 mg Oral Daily Clyde Canterbury, MD   10 mg at 11/12/15 1442  . magnesium oxide (MAG-OX) tablet 400 mg  400 mg Oral BID Clyde Canterbury, MD   400 mg at 11/12/15 1442  . metFORMIN (GLUCOPHAGE) tablet 1,000 mg  1,000 mg Oral BID WC Clyde Canterbury, MD   1,000 mg at 11/12/15 1657  . metoprolol (LOPRESSOR) tablet 50 mg  50 mg Oral BID Clyde Canterbury, MD   50 mg at 11/12/15 1245  . mometasone-formoterol (DULERA) 200-5 MCG/ACT inhaler 2 puff  2 puff Inhalation BID Clyde Canterbury, MD   2 puff at 11/12/15 1444  . montelukast (SINGULAIR) tablet 10 mg  10 mg Oral QHS Clyde Canterbury, MD      . morphine 2 MG/ML injection 2-4 mg  2-4 mg Intravenous Q2H PRN Clyde Canterbury, MD   4 mg at 11/12/15 1249  . ondansetron (ZOFRAN) tablet 4 mg  4 mg Oral Q4H PRN Clyde Canterbury, MD       Or  . ondansetron [facility]) injection 4 mg  4 mg  Intravenous Q4H PRN Clyde Canterbury, MD      . oxyCODONE-acetaminophen (PERCOCET/ROXICET) 5-325 MG per tablet 2 tablet  2 tablet Oral Q4H PRN Clyde Canterbury, MD   2 tablet at 11/12/15 1509  . pantoprazole (PROTONIX) EC tablet 40 mg  40 mg Oral BID Clyde Canterbury, MD   40 mg at 11/12/15 1442  . QUEtiapine (SEROQUEL) tablet 100 mg  100 mg Oral QHS Clyde Canterbury, MD      . sertraline (ZOLOFT) tablet 200 mg  200 mg Oral Daily Clyde Canterbury, MD   200 mg at 11/12/15 1441  . spironolactone (ALDACTONE) tablet 37.5 mg  37.5 mg Oral BID Clyde Canterbury, MD      . zonisamide (ZONEGRAN) capsule 75 mg  75 mg Oral Daily Clyde Canterbury, MD   75 mg at 11/12/15 1440  .  Facility-administered medications prior to admission  Medication Dose Route Frequency Provider Last Rate Last Dose  . iohexol (OMNIPAQUE) 180 MG/ML injection 10 mL  10 mL Intra-articular Once PRN Milinda Pointer, MD      . ropivacaine (PF) 2 mg/ml (0.2%) (NAROPIN) epidural 4 mL  4 mL Other Once Milinda Pointer, MD       Medications Prior to  Admission  Medication Sig Dispense Refill  . albuterol (PROVENTIL HFA;VENTOLIN HFA) 108 (90 BASE) MCG/ACT inhaler Inhale 2 puffs into the lungs every 6 (six) hours as needed for wheezing or shortness of breath.    Marland Kitchen amitriptyline (ELAVIL) 100 MG tablet Take 100 mg by mouth at bedtime.     Marland Kitchen atorvastatin (LIPITOR) 40 MG tablet Take 40 mg by mouth daily at 6 PM.     . butalbital-acetaminophen-caffeine (FIORICET, ESGIC) 50-325-40 MG tablet as needed.    . cetirizine (ZYRTEC) 5 MG tablet Take 5 mg by mouth daily.     . clonazePAM (KLONOPIN) 0.5 MG tablet Take 1 tablet (0.5 mg total) by mouth 2 (two) times daily as needed for anxiety. 60 tablet 2  . ergocalciferol (VITAMIN D2) 50000 UNITS capsule Take 50,000 Units by mouth once a week.    . Fluticasone-Salmeterol (ADVAIR DISKUS) 500-50 MCG/DOSE AEPB Inhale 1 puff into the lungs 2 (two) times daily.     Marland Kitchen gabapentin (NEURONTIN) 600 MG tablet 1,200 mg 2 (two) times daily.     Marland Kitchen gabapentin (NEURONTIN) 800 MG tablet 2,400 mg 2 (two) times daily.     Marland Kitchen ketoconazole (NIZORAL) 2 % cream APPLY AS DIRECTED TO AFFECTED AREA TWICE DAILY AS NEEDED    . magnesium oxide (MAG-OX) 400 MG tablet Take 1 tablet (400 mg total) by mouth 2 (two) times daily. 60 tablet 2  . meclizine (ANTIVERT) 25 MG tablet Take 1 tablet (25 mg total) by mouth 3 (three) times daily as needed for dizziness. 30 tablet 0  . metFORMIN (GLUCOPHAGE) 1000 MG tablet Take 1,000 mg by mouth 2 (two) times daily with a meal.     . metoprolol (LOPRESSOR) 50 MG tablet Take 50 mg by mouth 2 (two) times daily.     . montelukast (SINGULAIR) 10 MG tablet Take 10 mg by mouth at bedtime.     Marland Kitchen omeprazole (PRILOSEC) 40 MG capsule Take 40 mg by mouth 2 (two) times daily.     Marland Kitchen oxyCODONE-acetaminophen (PERCOCET/ROXICET) 5-325 MG tablet Take 1 tablet by mouth every 6 (six) hours as needed for moderate pain or severe pain. 120 tablet 0  . QUEtiapine (SEROQUEL) 100 MG tablet Take 1 tablet (100 mg total) by mouth at  bedtime. 45 tablet  0  . sertraline (ZOLOFT) 100 MG tablet Take 2 tablets (200 mg total) by mouth daily. 60 tablet 2  . spironolactone (ALDACTONE) 25 MG tablet Take 37.5 mg by mouth 2 (two) times daily.     Marland Kitchen tiZANidine (ZANAFLEX) 4 MG tablet     . triamcinolone (NASACORT) 55 MCG/ACT AERO nasal inhaler 2 sprays by Nasal route 2 (two) times daily.    Marland Kitchen zonisamide (ZONEGRAN) 50 MG capsule Take 75 mg by mouth daily.     Marland Kitchen acyclovir (ZOVIRAX) 800 MG tablet Take 800 mg by mouth 2 (two) times daily. Reported on 11/12/2015    . lisdexamfetamine (VYVANSE) 70 MG capsule Take 1 capsule (70 mg total) by mouth every morning. (Patient not taking: Reported on 11/12/2015) 30 capsule 0  . medroxyPROGESTERone (DEPO-PROVERA) 150 MG/ML injection     . Multiple Vitamin (MULTIVITAMIN WITH MINERALS) TABS tablet Take 2 tablets by mouth daily. Reported on 11/12/2015    . SPIRIVA HANDIHALER 18 MCG inhalation capsule Reported on 11/12/2015    . TRUE METRIX BLOOD GLUCOSE TEST test strip     . zolpidem (AMBIEN) 10 MG tablet Take 1 tablet (10 mg total) by mouth at bedtime as needed for sleep. (Patient not taking: Reported on 11/12/2015) 30 tablet 2    OBJECTIVE:  PHYSICAL EXAM  Vitals: Blood pressure 135/68, pulse 115, temperature 97.4 F (36.3 C), temperature source Oral, resp. rate 20, height '5\' 4"'  (1.626 m), weight 190.057 kg (419 lb), SpO2 94 %.. General: Well-developed, Well-nourished in no acute distress Mood: Mood and affect well adjusted, pleasant and cooperative. Orientation: Grossly alert and oriented. Vocal Quality: No hoarseness. Communicates verbally. Neck: Neck wound is clean, dry, intact. Drains in place with serosanguinous fluid. No hematoma. Respiratory: Normal respiratory effort without labored breathing.  MEDICAL DECISION MAKING: Data Review:  Results for orders placed or performed during the hospital encounter of 11/12/15 (from the past 48 hour(s))  Glucose, capillary     Status: Abnormal   Collection  Time: 11/12/15  6:44 AM  Result Value Ref Range   Glucose-Capillary 168 (H) 65 - 99 mg/dL  Pregnancy, urine POC     Status: None   Collection Time: 11/12/15  6:47 AM  Result Value Ref Range   Preg Test, Ur NEGATIVE NEGATIVE    Comment:        THE SENSITIVITY OF THIS METHODOLOGY IS >24 mIU/mL   I-STAT 4, (NA,K, GLUC, HGB,HCT)     Status: Abnormal   Collection Time: 11/12/15  7:17 AM  Result Value Ref Range   Sodium 138 135 - 145 mmol/L   Potassium 4.2 3.5 - 5.1 mmol/L   Glucose, Bld 168 (H) 65 - 99 mg/dL   HCT 39.0 36.0 - 46.0 %   Hemoglobin 13.3 12.0 - 15.0 g/dL  Glucose, capillary     Status: Abnormal   Collection Time: 11/12/15 10:48 AM  Result Value Ref Range   Glucose-Capillary 208 (H) 65 - 99 mg/dL  Glucose, capillary     Status: Abnormal   Collection Time: 11/12/15 12:43 PM  Result Value Ref Range   Glucose-Capillary 200 (H) 65 - 99 mg/dL   Comment 1 Notify RN   CBC     Status: Abnormal   Collection Time: 11/12/15  1:28 PM  Result Value Ref Range   WBC 14.8 (H) 3.6 - 11.0 K/uL   RBC 4.81 3.80 - 5.20 MIL/uL   Hemoglobin 12.4 12.0 - 16.0 g/dL   HCT 38.0 35.0 - 47.0 %  MCV 79.1 (L) 80.0 - 100.0 fL   MCH 25.8 (L) 26.0 - 34.0 pg   MCHC 32.6 32.0 - 36.0 g/dL   RDW 16.3 (H) 11.5 - 14.5 %   Platelets 337 150 - 440 K/uL  Creatinine, serum     Status: Abnormal   Collection Time: 11/12/15  1:28 PM  Result Value Ref Range   Creatinine, Ser 1.02 (H) 0.44 - 1.00 mg/dL   GFR calc non Af Amer >60 >60 mL/min   GFR calc Af Amer >60 >60 mL/min    Comment: (NOTE) The eGFR has been calculated using the CKD EPI equation. This calculation has not been validated in all clinical situations. eGFR's persistently <60 mL/min signify possible Chronic Kidney Disease.   Calcium     Status: Abnormal   Collection Time: 11/12/15  4:46 PM  Result Value Ref Range   Calcium 8.6 (L) 8.9 - 10.3 mg/dL  Glucose, capillary     Status: Abnormal   Collection Time: 11/12/15  4:48 PM  Result Value  Ref Range   Glucose-Capillary 221 (H) 65 - 99 mg/dL   Comment 1 Notify RN   . No results found..   ASSESSMENT: Doing well after total thyroidectomy for a goiter.   PLAN: Advance diet and monitor calcium level. Potential d/c tomorrow.   Riley Nearing, MD 11/12/2015 5:49 PM

## 2015-11-12 NOTE — Transfer of Care (Signed)
Immediate Anesthesia Transfer of Care Note  Patient: Karen Lewis  Procedure(s) Performed: Procedure(s): THYROIDECTOMY (N/A)  Patient Location: PACU  Anesthesia Type:General  Level of Consciousness: awake  Airway & Oxygen Therapy: Patient Spontanous Breathing and Patient connected to face mask oxygen  Post-op Assessment: Report given to RN and Post -op Vital signs reviewed and stable  Post vital signs: Reviewed and stable  Last Vitals:  Filed Vitals:   11/12/15 0648  BP: 150/84  Pulse: 100  Temp: 37.7 C  Resp: 22    Complications: No apparent anesthesia complications

## 2015-11-12 NOTE — H&P (Signed)
History and physical reviewed and will be scanned in later. No change in medical status reported by the patient or family, appears stable for surgery. All questions regarding the procedure answered, and patient (or family if a child) expressed understanding of the procedure.  Karen Lewis @TODAY@ 

## 2015-11-12 NOTE — OR Nursing (Signed)
Sacral pad sent to OR 

## 2015-11-12 NOTE — OR Nursing (Signed)
DR Andree Elk reviewed patient medications new orders received.

## 2015-11-13 ENCOUNTER — Other Ambulatory Visit: Payer: Self-pay | Admitting: Pain Medicine

## 2015-11-13 ENCOUNTER — Encounter: Payer: Self-pay | Admitting: Otolaryngology

## 2015-11-13 DIAGNOSIS — E042 Nontoxic multinodular goiter: Secondary | ICD-10-CM | POA: Diagnosis not present

## 2015-11-13 DIAGNOSIS — J45909 Unspecified asthma, uncomplicated: Secondary | ICD-10-CM | POA: Diagnosis not present

## 2015-11-13 DIAGNOSIS — Z888 Allergy status to other drugs, medicaments and biological substances status: Secondary | ICD-10-CM | POA: Diagnosis not present

## 2015-11-13 DIAGNOSIS — G4730 Sleep apnea, unspecified: Secondary | ICD-10-CM | POA: Diagnosis not present

## 2015-11-13 DIAGNOSIS — Z882 Allergy status to sulfonamides status: Secondary | ICD-10-CM | POA: Diagnosis not present

## 2015-11-13 DIAGNOSIS — R51 Headache: Secondary | ICD-10-CM | POA: Diagnosis not present

## 2015-11-13 DIAGNOSIS — Z9884 Bariatric surgery status: Secondary | ICD-10-CM | POA: Diagnosis not present

## 2015-11-13 DIAGNOSIS — Z6841 Body Mass Index (BMI) 40.0 and over, adult: Secondary | ICD-10-CM | POA: Diagnosis not present

## 2015-11-13 LAB — GLUCOSE, CAPILLARY
GLUCOSE-CAPILLARY: 154 mg/dL — AB (ref 65–99)
GLUCOSE-CAPILLARY: 176 mg/dL — AB (ref 65–99)

## 2015-11-13 LAB — CALCIUM: CALCIUM: 8.4 mg/dL — AB (ref 8.9–10.3)

## 2015-11-13 MED ORDER — OXYCODONE-ACETAMINOPHEN 5-325 MG PO TABS: 1.0000 | ORAL_TABLET | ORAL | Status: DC | PRN

## 2015-11-13 MED ORDER — CALCIUM CARBONATE-VITAMIN D 500-200 MG-UNIT PO TABS: 1.0000 | ORAL_TABLET | Freq: Three times a day (TID) | ORAL | Status: DC

## 2015-11-13 MED ORDER — DOCUSATE SODIUM 100 MG PO CAPS: 100.0000 mg | ORAL_CAPSULE | Freq: Two times a day (BID) | ORAL | Status: DC

## 2015-11-13 MED ORDER — LEVOTHYROXINE SODIUM 125 MCG PO TABS: 125.0000 ug | ORAL_TABLET | Freq: Every day | ORAL | Status: DC

## 2015-11-13 NOTE — Progress Notes (Signed)
11/13/2015  3:47 PM  Karen Lewis to be D/C'd Home per MD order.  Discussed prescriptions and follow up appointments with the patient. Prescriptions given to patient, medication list explained in detail. Pt verbalized understanding.    Medication List    STOP taking these medications        butalbital-acetaminophen-caffeine 50-325-40 MG tablet  Commonly known as:  FIORICET, ESGIC     ergocalciferol 50000 units capsule  Commonly known as:  VITAMIN D2      TAKE these medications        acyclovir 800 MG tablet  Commonly known as:  ZOVIRAX  Take 800 mg by mouth 2 (two) times daily. Reported on 11/12/2015     ADVAIR DISKUS 500-50 MCG/DOSE Aepb  Generic drug:  Fluticasone-Salmeterol  Inhale 1 puff into the lungs 2 (two) times daily.     albuterol 108 (90 Base) MCG/ACT inhaler  Commonly known as:  PROVENTIL HFA;VENTOLIN HFA  Inhale 2 puffs into the lungs every 6 (six) hours as needed for wheezing or shortness of breath.     amitriptyline 100 MG tablet  Commonly known as:  ELAVIL  Take 100 mg by mouth at bedtime.     atorvastatin 40 MG tablet  Commonly known as:  LIPITOR  Take 40 mg by mouth daily at 6 PM.     calcium-vitamin D 500-200 MG-UNIT tablet  Commonly known as:  OSCAL WITH D  Take 1 tablet by mouth 3 (three) times daily.     cetirizine 5 MG tablet  Commonly known as:  ZYRTEC  Take 5 mg by mouth daily.     clonazePAM 0.5 MG tablet  Commonly known as:  KLONOPIN  Take 1 tablet (0.5 mg total) by mouth 2 (two) times daily as needed for anxiety.     docusate sodium 100 MG capsule  Commonly known as:  COLACE  Take 1 capsule (100 mg total) by mouth 2 (two) times daily.     gabapentin 800 MG tablet  Commonly known as:  NEURONTIN  2,400 mg 2 (two) times daily.     gabapentin 600 MG tablet  Commonly known as:  NEURONTIN  1,200 mg 2 (two) times daily.     ketoconazole 2 % cream  Commonly known as:  NIZORAL  APPLY AS DIRECTED TO AFFECTED AREA TWICE DAILY AS NEEDED      levothyroxine 125 MCG tablet  Commonly known as:  SYNTHROID, LEVOTHROID  Take 1 tablet (125 mcg total) by mouth daily before breakfast.     lisdexamfetamine 70 MG capsule  Commonly known as:  VYVANSE  Take 1 capsule (70 mg total) by mouth every morning.     LOPRESSOR 50 MG tablet  Generic drug:  metoprolol  Take 50 mg by mouth 2 (two) times daily.     magnesium oxide 400 MG tablet  Commonly known as:  MAG-OX  Take 1 tablet (400 mg total) by mouth 2 (two) times daily.     meclizine 25 MG tablet  Commonly known as:  ANTIVERT  Take 1 tablet (25 mg total) by mouth 3 (three) times daily as needed for dizziness.     medroxyPROGESTERone 150 MG/ML injection  Commonly known as:  DEPO-PROVERA     metFORMIN 1000 MG tablet  Commonly known as:  GLUCOPHAGE  Take 1,000 mg by mouth 2 (two) times daily with a meal.     multivitamin with minerals Tabs tablet  Take 2 tablets by mouth daily. Reported on 11/12/2015  omeprazole 40 MG capsule  Commonly known as:  PRILOSEC  Take 40 mg by mouth 2 (two) times daily.     oxyCODONE-acetaminophen 5-325 MG tablet  Commonly known as:  PERCOCET/ROXICET  Take 1-2 tablets by mouth every 4 (four) hours as needed for severe pain.     QUEtiapine 100 MG tablet  Commonly known as:  SEROQUEL  Take 1 tablet (100 mg total) by mouth at bedtime.     sertraline 100 MG tablet  Commonly known as:  ZOLOFT  Take 2 tablets (200 mg total) by mouth daily.     SINGULAIR 10 MG tablet  Generic drug:  montelukast  Take 10 mg by mouth at bedtime.     SPIRIVA HANDIHALER 18 MCG inhalation capsule  Generic drug:  tiotropium  Reported on 11/12/2015     spironolactone 25 MG tablet  Commonly known as:  ALDACTONE  Take 37.5 mg by mouth 2 (two) times daily.     tiZANidine 4 MG tablet  Commonly known as:  ZANAFLEX     triamcinolone 55 MCG/ACT Aero nasal inhaler  Commonly known as:  NASACORT  2 sprays by Nasal route 2 (two) times daily.     TRUE METRIX BLOOD GLUCOSE  TEST test strip  Generic drug:  glucose blood     zolpidem 10 MG tablet  Commonly known as:  AMBIEN  Take 1 tablet (10 mg total) by mouth at bedtime as needed for sleep.     zonisamide 50 MG capsule  Commonly known as:  ZONEGRAN  Take 75 mg by mouth daily.        Filed Vitals:   11/12/15 2233 11/13/15 0517  BP: 118/67 131/54  Pulse: 102 94  Temp: 98.6 F (37 C) 97.8 F (36.6 C)  Resp: 20 20    Skin clean, dry and intact without evidence of skin break down, no evidence of skin tears noted. IV catheter discontinued intact. Site without signs and symptoms of complications. Dressing and pressure applied. Pt denies pain at this time. No complaints noted.  An After Visit Summary was printed and given to the patient. Patient escorted via [place], and D/C home via private auto.  Dola Argyle

## 2015-11-13 NOTE — Progress Notes (Signed)
Patient ID: Karen Lewis, female   DOB: 06-10-1971, 45 y.o.   MRN: [id number] [place], [place] [id number] July 25, 1971 Riley Nearing, MD   SUBJECTIVE: This 45 y.o. year old female is status post THYROIDECTOMY. Doing well. Wants to advance diet. Pain well controlled. Feels like she can swallow pills.  Medications:  Current Facility-Administered Medications  Medication Dose Route Frequency Provider Last Rate Last Dose  . 0.9 % NaCl with KCl 20 mEq/ L  infusion   Intravenous Continuous Clyde Canterbury, MD 100 mL/hr at 11/12/15 1450    . acyclovir (ZOVIRAX) 200 MG capsule 800 mg  800 mg Oral BID Clyde Canterbury, MD   800 mg at 11/12/15 2207  . albuterol (PROVENTIL) (2.5 MG/3ML) 0.083% nebulizer solution 2.5 mg  2.5 mg Nebulization Q6H Clyde Canterbury, MD   2.5 mg at 11/13/15 0719  . amitriptyline (ELAVIL) tablet 100 mg  100 mg Oral QHS Clyde Canterbury, MD   100 mg at 11/12/15 2207  . atorvastatin (LIPITOR) tablet 40 mg  40 mg Oral q1800 Clyde Canterbury, MD   40 mg at 11/12/15 2207  . bacitracin ointment 1 application  1 application Topical 3 times per day Clyde Canterbury, MD   1 application at 88/50/27 0525  . calcium-vitamin D (OSCAL WITH D) 500-200 MG-UNIT per tablet 2 tablet  2 tablet Oral BID Clyde Canterbury, MD   2 tablet at 11/12/15 2206  . clonazePAM (KLONOPIN) tablet 0.5 mg  0.5 mg Oral BID PRN Clyde Canterbury, MD   0.5 mg at 11/12/15 2207  . docusate sodium (COLACE) capsule 100 mg  100 mg Oral BID Clyde Canterbury, MD   100 mg at 11/12/15 2206  . fluticasone (FLONASE) 50 MCG/ACT nasal spray 2 spray  2 spray Each Nare Daily Clyde Canterbury, MD   2 spray at 11/12/15 1444  . gabapentin (NEURONTIN) tablet 3,600 mg  3,600 mg Oral BID Clyde Canterbury, MD   3,600 mg at 11/12/15 2208  . heparin injection 5,000 Units  5,000 Units Subcutaneous 3 times per day Clyde Canterbury, MD   5,000 Units at 11/13/15 0525  . insulin aspart (novoLOG) injection 0-20 Units  0-20 Units Subcutaneous TID WC Clyde Canterbury, MD   0 Units at 11/12/15 1200  .  levothyroxine (SYNTHROID, LEVOTHROID) tablet 125 mcg  125 mcg Oral QAC breakfast Clyde Canterbury, MD      . lisdexamfetamine (VYVANSE) capsule 70 mg  70 mg Oral Carlisle Cater, MD      . loratadine (CLARITIN) tablet 10 mg  10 mg Oral Daily Clyde Canterbury, MD   10 mg at 11/12/15 1442  . magnesium oxide (MAG-OX) tablet 400 mg  400 mg Oral BID Clyde Canterbury, MD   400 mg at 11/12/15 2207  . metFORMIN (GLUCOPHAGE) tablet 1,000 mg  1,000 mg Oral BID WC Clyde Canterbury, MD   1,000 mg at 11/12/15 1657  . metoprolol (LOPRESSOR) tablet 50 mg  50 mg Oral BID Clyde Canterbury, MD   50 mg at 11/12/15 2207  . mometasone-formoterol (DULERA) 200-5 MCG/ACT inhaler 2 puff  2 puff Inhalation BID Clyde Canterbury, MD   2 puff at 11/12/15 2205  . montelukast (SINGULAIR) tablet 10 mg  10 mg Oral QHS Clyde Canterbury, MD   10 mg at 11/12/15 2207  . morphine 2 MG/ML injection 2-4 mg  2-4 mg Intravenous Q2H PRN Clyde Canterbury, MD   2 mg at 11/12/15 1815  . ondansetron (ZOFRAN) tablet 4 mg  4 mg Oral Q4H PRN Clyde Canterbury, MD  Or  . ondansetron (ZOFRAN) injection 4 mg  4 mg Intravenous Q4H PRN Clyde Canterbury, MD      . oxyCODONE-acetaminophen (PERCOCET/ROXICET) 5-325 MG per tablet 2 tablet  2 tablet Oral Q4H PRN Clyde Canterbury, MD   2 tablet at 11/13/15 0524  . pantoprazole (PROTONIX) EC tablet 40 mg  40 mg Oral BID Clyde Canterbury, MD   40 mg at 11/12/15 2207  . QUEtiapine (SEROQUEL) tablet 100 mg  100 mg Oral QHS Clyde Canterbury, MD   100 mg at 11/12/15 2207  . sertraline (ZOLOFT) tablet 200 mg  200 mg Oral Daily Clyde Canterbury, MD   200 mg at 11/12/15 1441  . spironolactone (ALDACTONE) tablet 37.5 mg  37.5 mg Oral BID Clyde Canterbury, MD   37.5 mg at 11/12/15 1806  . zonisamide (ZONEGRAN) capsule 75 mg  75 mg Oral Daily Clyde Canterbury, MD   75 mg at 11/12/15 1440  .  Facility-administered medications prior to admission  Medication Dose Route Frequency Provider Last Rate Last Dose  . iohexol (OMNIPAQUE) 180 MG/ML injection 10 mL  10 mL  Intra-articular Once PRN Milinda Pointer, MD      . ropivacaine (PF) 2 mg/ml (0.2%) (NAROPIN) epidural 4 mL  4 mL Other Once Milinda Pointer, MD       Medications Prior to Admission  Medication Sig Dispense Refill  . albuterol (PROVENTIL HFA;VENTOLIN HFA) 108 (90 BASE) MCG/ACT inhaler Inhale 2 puffs into the lungs every 6 (six) hours as needed for wheezing or shortness of breath.    Marland Kitchen amitriptyline (ELAVIL) 100 MG tablet Take 100 mg by mouth at bedtime.     Marland Kitchen atorvastatin (LIPITOR) 40 MG tablet Take 40 mg by mouth daily at 6 PM.     . butalbital-acetaminophen-caffeine (FIORICET, ESGIC) 50-325-40 MG tablet as needed.    . cetirizine (ZYRTEC) 5 MG tablet Take 5 mg by mouth daily.     . clonazePAM (KLONOPIN) 0.5 MG tablet Take 1 tablet (0.5 mg total) by mouth 2 (two) times daily as needed for anxiety. 60 tablet 2  . ergocalciferol (VITAMIN D2) 50000 UNITS capsule Take 50,000 Units by mouth once a week.    . Fluticasone-Salmeterol (ADVAIR DISKUS) 500-50 MCG/DOSE AEPB Inhale 1 puff into the lungs 2 (two) times daily.     Marland Kitchen gabapentin (NEURONTIN) 600 MG tablet 1,200 mg 2 (two) times daily.     Marland Kitchen gabapentin (NEURONTIN) 800 MG tablet 2,400 mg 2 (two) times daily.     Marland Kitchen ketoconazole (NIZORAL) 2 % cream APPLY AS DIRECTED TO AFFECTED AREA TWICE DAILY AS NEEDED    . magnesium oxide (MAG-OX) 400 MG tablet Take 1 tablet (400 mg total) by mouth 2 (two) times daily. 60 tablet 2  . meclizine (ANTIVERT) 25 MG tablet Take 1 tablet (25 mg total) by mouth 3 (three) times daily as needed for dizziness. 30 tablet 0  . metFORMIN (GLUCOPHAGE) 1000 MG tablet Take 1,000 mg by mouth 2 (two) times daily with a meal.     . metoprolol (LOPRESSOR) 50 MG tablet Take 50 mg by mouth 2 (two) times daily.     . montelukast (SINGULAIR) 10 MG tablet Take 10 mg by mouth at bedtime.     Marland Kitchen omeprazole (PRILOSEC) 40 MG capsule Take 40 mg by mouth 2 (two) times daily.     Marland Kitchen oxyCODONE-acetaminophen (PERCOCET/ROXICET) 5-325 MG tablet  Take 1 tablet by mouth every 6 (six) hours as needed for moderate pain or severe pain. 120 tablet 0  .  QUEtiapine (SEROQUEL) 100 MG tablet Take 1 tablet (100 mg total) by mouth at bedtime. 45 tablet 0  . sertraline (ZOLOFT) 100 MG tablet Take 2 tablets (200 mg total) by mouth daily. 60 tablet 2  . spironolactone (ALDACTONE) 25 MG tablet Take 37.5 mg by mouth 2 (two) times daily.     Marland Kitchen tiZANidine (ZANAFLEX) 4 MG tablet     . triamcinolone (NASACORT) 55 MCG/ACT AERO nasal inhaler 2 sprays by Nasal route 2 (two) times daily.    Marland Kitchen zonisamide (ZONEGRAN) 50 MG capsule Take 75 mg by mouth daily.     Marland Kitchen acyclovir (ZOVIRAX) 800 MG tablet Take 800 mg by mouth 2 (two) times daily. Reported on 11/12/2015    . lisdexamfetamine (VYVANSE) 70 MG capsule Take 1 capsule (70 mg total) by mouth every morning. (Patient not taking: Reported on 11/12/2015) 30 capsule 0  . medroxyPROGESTERone (DEPO-PROVERA) 150 MG/ML injection     . Multiple Vitamin (MULTIVITAMIN WITH MINERALS) TABS tablet Take 2 tablets by mouth daily. Reported on 11/12/2015    . SPIRIVA HANDIHALER 18 MCG inhalation capsule Reported on 11/12/2015    . TRUE METRIX BLOOD GLUCOSE TEST test strip     . zolpidem (AMBIEN) 10 MG tablet Take 1 tablet (10 mg total) by mouth at bedtime as needed for sleep. (Patient not taking: Reported on 11/12/2015) 30 tablet 2    OBJECTIVE:  PHYSICAL EXAM  Vitals: Blood pressure 131/54, pulse 94, temperature 97.8 F (36.6 C), temperature source Oral, resp. rate 20, height '5\' 4"'  (1.626 m), weight 190.057 kg (419 lb), SpO2 94 %.. General: Well-developed, Well-nourished in no acute distress Mood: Mood and affect well adjusted, pleasant and cooperative. Orientation: Grossly alert and oriented. Vocal Quality: No hoarseness. Communicates verbally. Neck: Supple and symmetric with no palpable masses, tenderness or crepitance. The wound is clean, dry and intact. No hematoma. Drains removed with small amount of serosanguinous material.   Respiratory: Normal respiratory effort without labored breathing.  MEDICAL DECISION MAKING: Data Review:  Results for orders placed or performed during the hospital encounter of 11/12/15 (from the past 48 hour(s))  Glucose, capillary     Status: Abnormal   Collection Time: 11/12/15  6:44 AM  Result Value Ref Range   Glucose-Capillary 168 (H) 65 - 99 mg/dL  Pregnancy, urine POC     Status: None   Collection Time: 11/12/15  6:47 AM  Result Value Ref Range   Preg Test, Ur NEGATIVE NEGATIVE    Comment:        THE SENSITIVITY OF THIS METHODOLOGY IS >24 mIU/mL   I-STAT 4, (NA,K, GLUC, HGB,HCT)     Status: Abnormal   Collection Time: 11/12/15  7:17 AM  Result Value Ref Range   Sodium 138 135 - 145 mmol/L   Potassium 4.2 3.5 - 5.1 mmol/L   Glucose, Bld 168 (H) 65 - 99 mg/dL   HCT 39.0 36.0 - 46.0 %   Hemoglobin 13.3 12.0 - 15.0 g/dL  Glucose, capillary     Status: Abnormal   Collection Time: 11/12/15 10:48 AM  Result Value Ref Range   Glucose-Capillary 208 (H) 65 - 99 mg/dL  Glucose, capillary     Status: Abnormal   Collection Time: 11/12/15 12:43 PM  Result Value Ref Range   Glucose-Capillary 200 (H) 65 - 99 mg/dL   Comment 1 Notify RN   CBC     Status: Abnormal   Collection Time: 11/12/15  1:28 PM  Result Value Ref Range   WBC 14.8 (  H) 3.6 - 11.0 K/uL   RBC 4.81 3.80 - 5.20 MIL/uL   Hemoglobin 12.4 12.0 - 16.0 g/dL   HCT 38.0 35.0 - 47.0 %   MCV 79.1 (L) 80.0 - 100.0 fL   MCH 25.8 (L) 26.0 - 34.0 pg   MCHC 32.6 32.0 - 36.0 g/dL   RDW 16.3 (H) 11.5 - 14.5 %   Platelets 337 150 - 440 K/uL  Creatinine, serum     Status: Abnormal   Collection Time: 11/12/15  1:28 PM  Result Value Ref Range   Creatinine, Ser 1.02 (H) 0.44 - 1.00 mg/dL   GFR calc non Af Amer >60 >60 mL/min   GFR calc Af Amer >60 >60 mL/min    Comment: (NOTE) The eGFR has been calculated using the CKD EPI equation. This calculation has not been validated in all clinical situations. eGFR's persistently <60  mL/min signify possible Chronic Kidney Disease.   Calcium     Status: Abnormal   Collection Time: 11/12/15  4:46 PM  Result Value Ref Range   Calcium 8.6 (L) 8.9 - 10.3 mg/dL  Glucose, capillary     Status: Abnormal   Collection Time: 11/12/15  4:48 PM  Result Value Ref Range   Glucose-Capillary 221 (H) 65 - 99 mg/dL   Comment 1 Notify RN   Glucose, capillary     Status: Abnormal   Collection Time: 11/12/15 10:34 PM  Result Value Ref Range   Glucose-Capillary 159 (H) 65 - 99 mg/dL  Calcium     Status: Abnormal   Collection Time: 11/13/15  5:07 AM  Result Value Ref Range   Calcium 8.4 (L) 8.9 - 10.3 mg/dL  Glucose, capillary     Status: Abnormal   Collection Time: 11/13/15  7:14 AM  Result Value Ref Range   Glucose-Capillary 154 (H) 65 - 99 mg/dL   Comment 1 Notify RN   . No results found..   ASSESSMENT: Doing well after total thyroidectomy. Drains removed. Calcium looks good.  PLAN: D/c home on levothyroxine and Percocet. Will remove sutures in 1 week. Encouraged her to be out of bed and active, just no strenuous activity.   Riley Nearing, MD 11/13/2015 8:08 AM

## 2015-11-13 NOTE — Anesthesia Postprocedure Evaluation (Signed)
Anesthesia Post Note  Patient: Karen Lewis  Procedure(s) Performed: Procedure(s) (LRB): THYROIDECTOMY (N/A)  Patient location during evaluation: PACU Anesthesia Type: General Level of consciousness: awake and alert Pain management: pain level controlled Vital Signs Assessment: post-procedure vital signs reviewed and stable Respiratory status: spontaneous breathing, nonlabored ventilation, respiratory function stable and patient connected to nasal cannula oxygen Cardiovascular status: blood pressure returned to baseline and stable Postop Assessment: no signs of nausea or vomiting Anesthetic complications: no    Last Vitals:  Filed Vitals:   11/12/15 2233 11/13/15 0517  BP: 118/67 131/54  Pulse: 102 94  Temp: 37 C 36.6 C  Resp: 20 20    Last Pain:  Filed Vitals:   11/13/15 1354  PainSc: 5                  Molli Barrows

## 2015-11-14 LAB — SURGICAL PATHOLOGY

## 2015-11-20 DIAGNOSIS — J3081 Allergic rhinitis due to animal (cat) (dog) hair and dander: Secondary | ICD-10-CM | POA: Diagnosis not present

## 2015-11-20 DIAGNOSIS — J3089 Other allergic rhinitis: Secondary | ICD-10-CM | POA: Diagnosis not present

## 2015-11-20 DIAGNOSIS — J309 Allergic rhinitis, unspecified: Secondary | ICD-10-CM | POA: Diagnosis not present

## 2015-11-20 DIAGNOSIS — J301 Allergic rhinitis due to pollen: Secondary | ICD-10-CM | POA: Diagnosis not present

## 2015-11-20 DIAGNOSIS — Z91013 Allergy to seafood: Secondary | ICD-10-CM | POA: Diagnosis not present

## 2015-11-20 DIAGNOSIS — J449 Chronic obstructive pulmonary disease, unspecified: Secondary | ICD-10-CM | POA: Diagnosis not present

## 2015-11-24 ENCOUNTER — Other Ambulatory Visit
Admission: RE | Admit: 2015-11-24 | Discharge: 2015-11-24 | Disposition: A | Discharge status: Home / Self Care | Payer: Commercial Managed Care - HMO | Source: Ambulatory Visit | Attending: Pain Medicine | Admitting: Pain Medicine

## 2015-11-24 ENCOUNTER — Encounter: Payer: Self-pay | Admitting: Pain Medicine

## 2015-11-24 ENCOUNTER — Ambulatory Visit: Payer: Commercial Managed Care - HMO | Attending: Pain Medicine | Admitting: Pain Medicine

## 2015-11-24 VITALS — BP 139/71 | HR 105 | Temp 98.2°F | Resp 16 | Ht 64.0 in | Wt >= 6400 oz

## 2015-11-24 DIAGNOSIS — Z9884 Bariatric surgery status: Secondary | ICD-10-CM | POA: Insufficient documentation

## 2015-11-24 DIAGNOSIS — I10 Essential (primary) hypertension: Secondary | ICD-10-CM | POA: Insufficient documentation

## 2015-11-24 DIAGNOSIS — J45909 Unspecified asthma, uncomplicated: Secondary | ICD-10-CM | POA: Insufficient documentation

## 2015-11-24 DIAGNOSIS — M25562 Pain in left knee: Secondary | ICD-10-CM | POA: Diagnosis not present

## 2015-11-24 DIAGNOSIS — M797 Fibromyalgia: Secondary | ICD-10-CM | POA: Diagnosis not present

## 2015-11-24 DIAGNOSIS — E042 Nontoxic multinodular goiter: Secondary | ICD-10-CM | POA: Insufficient documentation

## 2015-11-24 DIAGNOSIS — Z96642 Presence of left artificial hip joint: Secondary | ICD-10-CM | POA: Diagnosis not present

## 2015-11-24 DIAGNOSIS — Z5181 Encounter for therapeutic drug level monitoring: Secondary | ICD-10-CM | POA: Diagnosis not present

## 2015-11-24 DIAGNOSIS — L309 Dermatitis, unspecified: Secondary | ICD-10-CM | POA: Diagnosis not present

## 2015-11-24 DIAGNOSIS — M25561 Pain in right knee: Secondary | ICD-10-CM | POA: Diagnosis not present

## 2015-11-24 DIAGNOSIS — F5081 Binge eating disorder: Secondary | ICD-10-CM | POA: Diagnosis not present

## 2015-11-24 DIAGNOSIS — E119 Type 2 diabetes mellitus without complications: Secondary | ICD-10-CM | POA: Insufficient documentation

## 2015-11-24 DIAGNOSIS — E7801 Familial hypercholesterolemia: Secondary | ICD-10-CM | POA: Insufficient documentation

## 2015-11-24 DIAGNOSIS — Z9049 Acquired absence of other specified parts of digestive tract: Secondary | ICD-10-CM | POA: Diagnosis not present

## 2015-11-24 DIAGNOSIS — G4700 Insomnia, unspecified: Secondary | ICD-10-CM | POA: Diagnosis not present

## 2015-11-24 DIAGNOSIS — E559 Vitamin D deficiency, unspecified: Secondary | ICD-10-CM | POA: Insufficient documentation

## 2015-11-24 DIAGNOSIS — G8929 Other chronic pain: Secondary | ICD-10-CM

## 2015-11-24 DIAGNOSIS — M159 Polyosteoarthritis, unspecified: Secondary | ICD-10-CM

## 2015-11-24 DIAGNOSIS — E282 Polycystic ovarian syndrome: Secondary | ICD-10-CM | POA: Insufficient documentation

## 2015-11-24 DIAGNOSIS — Z8614 Personal history of Methicillin resistant Staphylococcus aureus infection: Secondary | ICD-10-CM | POA: Insufficient documentation

## 2015-11-24 DIAGNOSIS — M4316 Spondylolisthesis, lumbar region: Secondary | ICD-10-CM | POA: Diagnosis not present

## 2015-11-24 DIAGNOSIS — Z79891 Long term (current) use of opiate analgesic: Secondary | ICD-10-CM | POA: Diagnosis not present

## 2015-11-24 DIAGNOSIS — F411 Generalized anxiety disorder: Secondary | ICD-10-CM | POA: Diagnosis not present

## 2015-11-24 DIAGNOSIS — M1991 Primary osteoarthritis, unspecified site: Secondary | ICD-10-CM | POA: Diagnosis not present

## 2015-11-24 DIAGNOSIS — M170 Bilateral primary osteoarthritis of knee: Secondary | ICD-10-CM

## 2015-11-24 DIAGNOSIS — Z7984 Long term (current) use of oral hypoglycemic drugs: Secondary | ICD-10-CM | POA: Insufficient documentation

## 2015-11-24 DIAGNOSIS — M109 Gout, unspecified: Secondary | ICD-10-CM | POA: Diagnosis not present

## 2015-11-24 DIAGNOSIS — M549 Dorsalgia, unspecified: Secondary | ICD-10-CM | POA: Diagnosis present

## 2015-11-24 DIAGNOSIS — M160 Bilateral primary osteoarthritis of hip: Secondary | ICD-10-CM | POA: Diagnosis not present

## 2015-11-24 DIAGNOSIS — F4321 Adjustment disorder with depressed mood: Secondary | ICD-10-CM | POA: Insufficient documentation

## 2015-11-24 DIAGNOSIS — F329 Major depressive disorder, single episode, unspecified: Secondary | ICD-10-CM | POA: Insufficient documentation

## 2015-11-24 DIAGNOSIS — M150 Primary generalized (osteo)arthritis: Secondary | ICD-10-CM

## 2015-11-24 DIAGNOSIS — M25569 Pain in unspecified knee: Secondary | ICD-10-CM | POA: Diagnosis present

## 2015-11-24 DIAGNOSIS — M899 Disorder of bone, unspecified: Secondary | ICD-10-CM | POA: Insufficient documentation

## 2015-11-24 DIAGNOSIS — F331 Major depressive disorder, recurrent, moderate: Secondary | ICD-10-CM | POA: Insufficient documentation

## 2015-11-24 LAB — COMPREHENSIVE METABOLIC PANEL
ALBUMIN: 3.7 g/dL (ref 3.5–5.0)
ALT: 20 U/L (ref 14–54)
AST: 23 U/L (ref 15–41)
Alkaline Phosphatase: 81 U/L (ref 38–126)
Anion gap: 11 (ref 5–15)
BUN: 8 mg/dL (ref 6–20)
CHLORIDE: 105 mmol/L (ref 101–111)
CO2: 23 mmol/L (ref 22–32)
Calcium: 8.6 mg/dL — ABNORMAL LOW (ref 8.9–10.3)
Creatinine, Ser: 0.76 mg/dL (ref 0.44–1.00)
GFR calc Af Amer: >60 mL/min (ref >60)
GFR calc non Af Amer: >60 mL/min (ref >60)
GLUCOSE: 143 mg/dL — AB (ref 65–99)
POTASSIUM: 4.3 mmol/L (ref 3.5–5.1)
SODIUM: 139 mmol/L (ref 135–145)
Total Bilirubin: 0.3 mg/dL (ref 0.3–1.2)
Total Protein: 7.6 g/dL (ref 6.5–8.1)

## 2015-11-24 LAB — SEDIMENTATION RATE: SED RATE: 36 mm/h — AB (ref 0–20)

## 2015-11-24 LAB — VITAMIN B12: VITAMIN B 12: 303 pg/mL (ref 180–914)

## 2015-11-24 LAB — MAGNESIUM: Magnesium: 1.6 mg/dL — ABNORMAL LOW (ref 1.7–2.4)

## 2015-11-24 LAB — C-REACTIVE PROTEIN: CRP: <0.5 mg/dL (ref <1.0)

## 2015-11-24 MED ORDER — GABAPENTIN 600 MG PO TABS: 600.0000 mg | ORAL_TABLET | Freq: Four times a day (QID) | ORAL | Status: DC

## 2015-11-24 MED ORDER — GABAPENTIN 800 MG PO TABS: 800.0000 mg | ORAL_TABLET | Freq: Every day | ORAL | Status: DC

## 2015-11-24 NOTE — Progress Notes (Signed)
Patient's Name: Karen Lewis MRN: [id number] DOB: August 23, 1971 DOS: 11/24/2015  Primary Reason(s) for Visit: Encounter for Medication Management CC: Back Pain and Knee Pain   HPI  Karen Lewis is a 45 y.o. year old, female patient, who returns today as an established patient. She has History of asthma; Aggrieved; Binge eating disorder; Fibromyalgia; Anxiety, generalized; Insomnia, persistent; Depression, major, recurrent, moderate ([place]); Allergic state; Airway hyperreactivity; Chicken pox; Clinical depression; Dermatitis, eczematoid; Gout; BP (high blood pressure); HLD (hyperlipidemia); Headache, migraine; Combined fat and carbohydrate induced hyperlipemia; Multinodular goiter; Obstructive apnea; Bilateral polycystic ovarian syndrome; Apnea, sleep; Disease of thyroid gland; Disease with a predominantly sexual mode of transmission; Avitaminosis D; History of surgical procedure; Type 2 diabetes mellitus ([place]); Chronic pain; Long term current use of opiate analgesic; Long term prescription opiate use; Opiate use; Encounter for therapeutic drug level monitoring; Opiate dependence ([place]); Adjustment disorder with depressed mood; Essential (primary) hypertension; Cannot sleep; Moderate episode of recurrent major depressive disorder ([place]); Goiter, nontoxic, multinodular; Chronic knee pain (Bilateral) (Location of Primary Source of Pain); Osteoarthritis of knee (Bilateral); Chronic low back pain (R>L); Lumbar facet syndrome (Bilateral) (R>L); Osteoarthrosis; Grade 1 (1.4 cm) Anterolisthesis of L4 over L5; Chronic hip pain (Bilateral) (Left total hip replacement); History of total left hip replacement; History of methicillin resistant staphylococcus aureus (MRSA); Vitamin D deficiency; History of bariatric surgery; Hypomagnesemia; Morbid obesity with BMI of 70 and over, adult ([place]) (71.75 on 08/27/2015); Congenital spondylolisthesis; History of methicillin resistant Staphylococcus aureus infection; History of artificial  joint; Lumbar spondylosis; Osteoarthritis of hips (Bilateral); Controlled drug dependence ([place]); Diabetes mellitus ([place]); Morbid obesity ([place]); Opioid dependence ([place]); and Thyroid goiter on her problem list.. Her primarily concern today is the Back Pain and Knee Pain   The patient returns to the clinics today for follow-up evaluation after the bilateral intra-articular hip injection. She did get some benefit, but not during the time that the local anesthetic was working, suggesting that the etiology of the pain may be elsewhere. We will go ahead and complete the series of 5 Hyalgan knee injections. She already had her first. She is already being taken care of by a psychiatrist and a counselor for her severe depression. On 11/12/2015 she had her complete thyroidectomy done due to no drills. She will now be on Synthroid for the rest of her life. Today we spoke about bringing her BMI down to 30. She has not done anything about this but she needs to work.  Pain Assessment: Self-Reported Pain Score: 5  Reported level is compatible with observation Pain Type: Chronic pain Pain Location: Back Pain Orientation: Lower, Mid, Right Pain Descriptors / Indicators: Aching, Sore Pain Frequency: Constant  Date of Last Visit: 11/06/15 Service Provided on Last Visit: Procedure (right hip injection)  Controlled Substance Pharmacotherapy Assessment  Analgesic: Oxycodone/APAP 5/325 one every 6 hours when necessary for pain (20 mg/day) MME/day: 30 mg/day Pharmacokinetics: Onset of action (Liberation/Absorption): Within expected pharmacological parameters Time to Peak effect (Distribution): Timing and results are as within normal expected parameters Duration of action (Metabolism/Excretion): Within normal limits for medication Pharmacodynamics: Analgesic Effect: More than 50% Activity Facilitation: Medication(s) allow patient to sit, stand, walk, and do the basic ADLs Perceived Effectiveness: Described as  relatively effective, allowing for increase in activities of daily living (ADL) Side-effects or Adverse reactions: None reported Monitoring: [place] PMP: Compliant with practice rules and regulations UDS Results/interpretation: Last UDS done on 10/02/2015 was read as abnormal due to the absence of oxycodone, clonazepam, and amphetamine, all of which  she had indicated she was taking. Medication Assessment Form: Reviewed. Patient indicates being compliant with therapy Treatment compliance: Compliant Risk Assessment: Aberrant Behavior: None observed today Substance Use Disorder (SUD) Risk Level: Low Opioid Risk Tool (ORT) Score:  9 High Risk for Opioid Abuse (Score >8) Depression Scale Score: PHQ-2: PHQ-2 Total Score: 3 38.4% Probability of major depressive disorder (3) PHQ-9: PHQ-9 Total Score: 15 Moderately severe depression (15-19)  Pharmacologic Plan: No change in therapy, at this time   Laboratory Workup  Last ED UDS: No results found for: THCU, COCAINSCRNUR, PCPSCRNUR, MDMA, AMPHETMU, METHADONE, ETOH  Inflammation Markers Lab Results  Component Value Date   ESRSEDRATE 19 06/17/2014    Renal Function Lab Results  Component Value Date   BUN 10 05/08/2015   CREATININE 1.02* 11/12/2015   GFRAA >60 11/12/2015   GFRNONAA >60 11/12/2015    Hepatic Function Lab Results  Component Value Date   AST 26 05/08/2015   ALT 17 05/08/2015   ALBUMIN 3.5 05/08/2015    Electrolytes Lab Results  Component Value Date   NA 138 11/12/2015   K 4.2 11/12/2015   CL 109 05/08/2015   CALCIUM 8.4* 11/13/2015   MG 1.6* 06/17/2014   Post-Procedure Assessment  Procedure done on last visit: Right-sided intra-articular hip injection under fluoroscopic guidance without IV sedation. Side-effects or Adverse reactions: None reported Sedation: No sedation used  Results: Ultra-Short Term Relief (First 1 hour after procedure): 0 %  No analgesic relief from Local Anesthetics would suggest pain  etiology to reside elsewhere Short Term Relief (Initial 4-6 hrs after procedure): 0 % No analgesic effect could suggest the source of pain to be elsewhere Long Term Relief : 65 % (ongoing) Long-term benefit would suggest an inflammatory etiology to the pain   Current Relief (Now):  65 %  Persistent relief would suggest effective anti-inflammatory effects from steroids Interpretation of Results: The results of this diagnostic injection would suggest that the hip joint not to be the primary cause of the pain in that area. This is likely to be coming from her lumbar spine.  Allergies  Karen Lewis is allergic to xolair; bactrim; aspirin; ciprofloxacin; clindamycin; motrin; sulfa antibiotics; and nsaids.  Meds  The patient has a current medication list which includes the following prescription(s): acyclovir, albuterol, amitriptyline, atorvastatin, butalbital-acetaminophen-caffeine, calcium-vitamin d, cetirizine, clonazepam, fluticasone-salmeterol, gabapentin, gabapentin, ipratropium-albuterol, ketoconazole, levothyroxine, lisdexamfetamine, magnesium oxide, meclizine, medroxyprogesterone, metformin, metoprolol, montelukast, multivitamin with minerals, nystatin, omeprazole, oxycodone-acetaminophen, quetiapine, sertraline, spiriva handihaler, spironolactone, tizanidine, triamcinolone, true metrix blood glucose test, zolpidem, and zonisamide.  Current Outpatient Prescriptions on File Prior to Visit  Medication Sig  . acyclovir (ZOVIRAX) 800 MG tablet Take 800 mg by mouth 2 (two) times daily. Reported on 11/12/2015  . albuterol (PROVENTIL HFA;VENTOLIN HFA) 108 (90 BASE) MCG/ACT inhaler Inhale 2 puffs into the lungs every 6 (six) hours as needed for wheezing or shortness of breath.  Marland Kitchen amitriptyline (ELAVIL) 100 MG tablet Take 100 mg by mouth at bedtime.   Marland Kitchen atorvastatin (LIPITOR) 40 MG tablet Take 40 mg by mouth daily at 6 PM.   . calcium-vitamin D (OSCAL WITH D) 500-200 MG-UNIT tablet Take 1 tablet by mouth 3  (three) times daily.  . cetirizine (ZYRTEC) 5 MG tablet Take 5 mg by mouth daily.   . clonazePAM (KLONOPIN) 0.5 MG tablet Take 1 tablet (0.5 mg total) by mouth 2 (two) times daily as needed for anxiety.  . Fluticasone-Salmeterol (ADVAIR DISKUS) 500-50 MCG/DOSE AEPB Inhale 1 puff into the lungs 2 (two) times  daily.   . ketoconazole (NIZORAL) 2 % cream APPLY AS DIRECTED TO AFFECTED AREA TWICE DAILY AS NEEDED  . levothyroxine (SYNTHROID, LEVOTHROID) 125 MCG tablet Take 1 tablet (125 mcg total) by mouth daily before breakfast.  . lisdexamfetamine (VYVANSE) 70 MG capsule Take 1 capsule (70 mg total) by mouth every morning.  . magnesium oxide (MAG-OX) 400 MG tablet Take 1 tablet (400 mg total) by mouth 2 (two) times daily.  . meclizine (ANTIVERT) 25 MG tablet Take 1 tablet (25 mg total) by mouth 3 (three) times daily as needed for dizziness.  . medroxyPROGESTERone (DEPO-PROVERA) 150 MG/ML injection   . metFORMIN (GLUCOPHAGE) 1000 MG tablet Take 1,000 mg by mouth 2 (two) times daily with a meal.   . metoprolol (LOPRESSOR) 50 MG tablet Take 50 mg by mouth 2 (two) times daily.   . montelukast (SINGULAIR) 10 MG tablet Take 10 mg by mouth at bedtime.   . Multiple Vitamin (MULTIVITAMIN WITH MINERALS) TABS tablet Take 2 tablets by mouth daily. Reported on 11/12/2015  . omeprazole (PRILOSEC) 40 MG capsule Take 40 mg by mouth 2 (two) times daily.   . QUEtiapine (SEROQUEL) 100 MG tablet Take 1 tablet (100 mg total) by mouth at bedtime.  . sertraline (ZOLOFT) 100 MG tablet Take 2 tablets (200 mg total) by mouth daily.  Marland Kitchen SPIRIVA HANDIHALER 18 MCG inhalation capsule Reported on 11/12/2015  . spironolactone (ALDACTONE) 25 MG tablet Take 37.5 mg by mouth 2 (two) times daily.   Marland Kitchen tiZANidine (ZANAFLEX) 4 MG tablet   . triamcinolone (NASACORT) 55 MCG/ACT AERO nasal inhaler 2 sprays by Nasal route 2 (two) times daily.  . TRUE METRIX BLOOD GLUCOSE TEST test strip   . zolpidem (AMBIEN) 10 MG tablet Take 1 tablet (10 mg  total) by mouth at bedtime as needed for sleep.  Marland Kitchen zonisamide (ZONEGRAN) 50 MG capsule Take 75 mg by mouth daily.    No current facility-administered medications on file prior to visit.    ROS  Constitutional: Afebrile, no chills, well hydrated and well nourished Gastrointestinal: negative Musculoskeletal:negative Neurological: negative Behavioral/Psych: negative  [place]  Medical:  Karen Lewis  has a past medical history of Anginal pain ([place]); Hypertension; Dysrhythmia; Asthma; Sleep apnea; Thyroid nodule (bilateral); Diabetes mellitus without complication ([place]); Depression; Anxiety; GERD (gastroesophageal reflux disease); History of hiatal hernia; Headache; Neuromuscular disorder ([place]); Fibromyalgia; Arthritis; Anemia; Obesity; H/O cardiovascular disorder (03/10/2015); H/O thyroid disease (03/10/2015); H/O surgical procedure (12/05/2012); Arthralgia of hip (07/29/2015); Arthritis, degenerative (07/29/2015); Gonalgia (07/29/2015); Cephalalgia (07/25/2014); History of artificial joint (07/29/2015); LBP (low back pain) (07/29/2015); Primary osteoarthritis of both knees (07/29/2015); Dependence on unknown drug ([place]); Hypomagnesemia; Gout; Herpes; PCOS (polycystic ovarian syndrome); Eczema; and Hypothyroidism. Family: family history includes Alcohol abuse in her father and mother; Anxiety disorder in her father and mother; COPD in her father; Depression in her brother, father, and mother; Diabetes in her brother, father, and mother; Hypertension in her brother, father, and mother; Kidney cancer in her mother; Kidney failure in her father; Post-traumatic stress disorder in her father; Sleep apnea in her brother, father, and mother. Surgical:  has past surgical history that includes Laparoscopic partial gastrectomy; Shoulder arthroscopy (Right); Joint replacement (Left, hip); Carpal tunnel release (Bilateral); Diagnostic laparoscopy; Cholecystectomy; Trigger finger release (Right); and Thyroidectomy (N/A,  11/12/2015). Tobacco:  reports that she has never smoked. She has never used smokeless tobacco. Alcohol:  reports that she does not drink alcohol. Drug:  reports that she does not use illicit drugs.  Physical Exam  Vitals:  Today's Vitals   11/24/15 0938 11/24/15 0939  BP:  139/71  Pulse: 105   Temp: 98.2 F (36.8 C)   Resp: 16   Height: 5\' 4"  (1.626 m)   Weight: 419 lb (190.057 kg)   SpO2: 98%   PainSc: 5  5   PainLoc: Back     Calculated BMI: Body mass index is 71.89 kg/(m^2).     General appearance: alert, cooperative, appears stated age, no distress and morbidly obese Eyes: PERLA Respiratory: No evidence respiratory distress, no audible rales or ronchi and no use of accessory muscles of respiration  Lumbar Spine Inspection: No gross anomalies detected Alignment: Symetrical ROM: Decreased  Gait: Waddling (Hip pathology)  Lower Extremities Inspection: No gross anomalies detected ROM: Decreased Sensory:  Normal Motor: Grossly intact  Assessment & Plan  Primary Diagnosis & Pertinent Problem List: The primary encounter diagnosis was Chronic pain. Diagnoses of Long term current use of opiate analgesic, Encounter for therapeutic drug level monitoring, Fibromyalgia, Primary osteoarthritis involving multiple joints, Chronic knee pain (Bilateral) (Location of Primary Source of Pain), and Primary osteoarthritis of both knees were also pertinent to this visit.  Visit Diagnosis: 1. Chronic pain   2. Long term current use of opiate analgesic   3. Encounter for therapeutic drug level monitoring   4. Fibromyalgia   5. Primary osteoarthritis involving multiple joints   6. Chronic knee pain (Bilateral) (Location of Primary Source of Pain)   7. Primary osteoarthritis of both knees     Problem-specific Plan(s): No problem-specific assessment & plan notes found for this encounter.   Plan of Care  Pharmacotherapy (Medications Ordered): Meds ordered this encounter  Medications   . gabapentin (NEURONTIN) 800 MG tablet    Sig: Take 1 tablet (800 mg total) by mouth 6 (six) times daily.    Dispense:  540 tablet    Refill:  0    Do not place this medication, or any other prescription from our practice, on "Automatic Refill". Patient may have prescription filled one day early if pharmacy is closed on scheduled refill date.  . gabapentin (NEURONTIN) 600 MG tablet    Sig: Take 1 tablet (600 mg total) by mouth every 6 (six) hours.    Dispense:  360 tablet    Refill:  0    Do not place this medication, or any other prescription from our practice, on "Automatic Refill". Patient may have prescription filled one day early if pharmacy is closed on scheduled refill date.    Lab-work & Procedure Ordered: Orders Placed This Encounter  Procedures  . KNEE INJECTION    Please order Hyalgan.    Standing Status: Future     Number of Occurrences:      Standing Expiration Date: 11/23/2016    Scheduling Instructions:     Side: Bilateral, under fluoroscopic guidance (#2)     Sedation: No Sedation.     Timeframe: ASAA    Order Specific Question:  Where will this procedure be performed?    Answer:  [facility]  . Comprehensive metabolic panel    Standing Status: Future     Number of Occurrences: 1     Standing Expiration Date: 12/24/2015    Order Specific Question:  Has the patient fasted?    Answer:  No  . C-reactive protein    Standing Status: Future     Number of Occurrences: 1     Standing Expiration Date: 12/24/2015  . Magnesium    Standing Status: Future  Number of Occurrences: 1     Standing Expiration Date: 12/24/2015  . Sedimentation rate    Standing Status: Future     Number of Occurrences: 1     Standing Expiration Date: 12/24/2015  . Vitamin B12    Indication: Bone Pain (M89.9)    Standing Status: Future     Number of Occurrences: 1     Standing Expiration Date: 12/24/2015  . Vitamin D pnl(25-hydrxy+1,25-dihy)-bld    Standing Status: Future      Number of Occurrences: 1     Standing Expiration Date: 12/24/2015    Imaging Ordered: None  Interventional Therapies: Scheduled: Bilateral Hyalgan knee injection #2 under fluoroscopic guidance, no sedation. PRN Procedures: Consider genicular nerve blocks.    Referral(s) or Consult(s): None at this time.  Medications administered during this visit: Karen Lewis had no medications administered during this visit.  Future Appointments Date Time Provider [place]  11/25/2015 8:20 AM Milinda Pointer, MD [facility] None    Primary Care Physician: Idelle Crouch, MD Location: [facility] Facility Note by: Kathlen Brunswick. [name], M.D, DABA, DABAPM, DABPM, DABIPP, FIPP  Pain Score Disclaimer: We use the NRS-11 scale. This is a self-reported, subjective measurement of pain severity with only modest accuracy. It is used primarily to identify changes within a particular patient. It must be understood that outpatient pain scales are significantly less accurate that those used for research, where they can be applied under ideal controlled circumstances with minimal exposure to variables. In reality, the score is likely to be a combination of pain intensity and pain affect, where pain affect describes the degree of emotional arousal or changes in action readiness caused by the sensory experience of pain. Factors such as social and work situation, setting, emotional state, anxiety levels, expectation, and prior pain experience may influence pain perception and show large inter-individual differences that may also be affected by time variables.

## 2015-11-24 NOTE — Progress Notes (Signed)
Safety precautions to be maintained throughout the outpatient stay will include: orient to surroundings, keep bed in low position, maintain call bell within reach at all times, provide assistance with transfer out of bed and ambulation. Percocet pill count # 65.5/120  Filled 09-30-15

## 2015-11-25 ENCOUNTER — Ambulatory Visit: Payer: Commercial Managed Care - HMO | Attending: Pain Medicine | Admitting: Pain Medicine

## 2015-11-25 ENCOUNTER — Encounter: Payer: Self-pay | Admitting: Pain Medicine

## 2015-11-25 VITALS — BP 130/88 | HR 95 | Temp 98.9°F | Resp 16 | Ht 64.0 in | Wt >= 6400 oz

## 2015-11-25 DIAGNOSIS — J45909 Unspecified asthma, uncomplicated: Secondary | ICD-10-CM | POA: Insufficient documentation

## 2015-11-25 DIAGNOSIS — M25562 Pain in left knee: Secondary | ICD-10-CM | POA: Insufficient documentation

## 2015-11-25 DIAGNOSIS — E559 Vitamin D deficiency, unspecified: Secondary | ICD-10-CM | POA: Insufficient documentation

## 2015-11-25 DIAGNOSIS — E042 Nontoxic multinodular goiter: Secondary | ICD-10-CM | POA: Diagnosis not present

## 2015-11-25 DIAGNOSIS — I10 Essential (primary) hypertension: Secondary | ICD-10-CM | POA: Diagnosis not present

## 2015-11-25 DIAGNOSIS — G8929 Other chronic pain: Secondary | ICD-10-CM | POA: Insufficient documentation

## 2015-11-25 DIAGNOSIS — M25569 Pain in unspecified knee: Secondary | ICD-10-CM | POA: Diagnosis present

## 2015-11-25 DIAGNOSIS — M47816 Spondylosis without myelopathy or radiculopathy, lumbar region: Secondary | ICD-10-CM | POA: Diagnosis not present

## 2015-11-25 DIAGNOSIS — M109 Gout, unspecified: Secondary | ICD-10-CM | POA: Diagnosis not present

## 2015-11-25 DIAGNOSIS — Z8614 Personal history of Methicillin resistant Staphylococcus aureus infection: Secondary | ICD-10-CM | POA: Diagnosis not present

## 2015-11-25 DIAGNOSIS — M160 Bilateral primary osteoarthritis of hip: Secondary | ICD-10-CM | POA: Diagnosis not present

## 2015-11-25 DIAGNOSIS — Z5181 Encounter for therapeutic drug level monitoring: Secondary | ICD-10-CM | POA: Insufficient documentation

## 2015-11-25 DIAGNOSIS — E7801 Familial hypercholesterolemia: Secondary | ICD-10-CM | POA: Diagnosis not present

## 2015-11-25 DIAGNOSIS — M25561 Pain in right knee: Secondary | ICD-10-CM | POA: Diagnosis not present

## 2015-11-25 DIAGNOSIS — E282 Polycystic ovarian syndrome: Secondary | ICD-10-CM | POA: Diagnosis not present

## 2015-11-25 DIAGNOSIS — J3089 Other allergic rhinitis: Secondary | ICD-10-CM | POA: Diagnosis not present

## 2015-11-25 DIAGNOSIS — J3081 Allergic rhinitis due to animal (cat) (dog) hair and dander: Secondary | ICD-10-CM | POA: Diagnosis not present

## 2015-11-25 DIAGNOSIS — F5081 Binge eating disorder: Secondary | ICD-10-CM | POA: Insufficient documentation

## 2015-11-25 DIAGNOSIS — F4321 Adjustment disorder with depressed mood: Secondary | ICD-10-CM | POA: Insufficient documentation

## 2015-11-25 DIAGNOSIS — Z9884 Bariatric surgery status: Secondary | ICD-10-CM | POA: Insufficient documentation

## 2015-11-25 DIAGNOSIS — M797 Fibromyalgia: Secondary | ICD-10-CM | POA: Diagnosis not present

## 2015-11-25 DIAGNOSIS — F331 Major depressive disorder, recurrent, moderate: Secondary | ICD-10-CM | POA: Insufficient documentation

## 2015-11-25 DIAGNOSIS — E119 Type 2 diabetes mellitus without complications: Secondary | ICD-10-CM | POA: Insufficient documentation

## 2015-11-25 DIAGNOSIS — B370 Candidal stomatitis: Secondary | ICD-10-CM | POA: Diagnosis not present

## 2015-11-25 DIAGNOSIS — M4316 Spondylolisthesis, lumbar region: Secondary | ICD-10-CM | POA: Diagnosis not present

## 2015-11-25 DIAGNOSIS — Q762 Congenital spondylolisthesis: Secondary | ICD-10-CM | POA: Insufficient documentation

## 2015-11-25 DIAGNOSIS — M170 Bilateral primary osteoarthritis of knee: Secondary | ICD-10-CM | POA: Diagnosis not present

## 2015-11-25 DIAGNOSIS — F411 Generalized anxiety disorder: Secondary | ICD-10-CM | POA: Diagnosis not present

## 2015-11-25 DIAGNOSIS — L309 Dermatitis, unspecified: Secondary | ICD-10-CM | POA: Insufficient documentation

## 2015-11-25 DIAGNOSIS — G4733 Obstructive sleep apnea (adult) (pediatric): Secondary | ICD-10-CM | POA: Diagnosis not present

## 2015-11-25 DIAGNOSIS — Z6841 Body Mass Index (BMI) 40.0 and over, adult: Secondary | ICD-10-CM | POA: Insufficient documentation

## 2015-11-25 DIAGNOSIS — R51 Headache: Secondary | ICD-10-CM | POA: Diagnosis not present

## 2015-11-25 DIAGNOSIS — J301 Allergic rhinitis due to pollen: Secondary | ICD-10-CM | POA: Diagnosis not present

## 2015-11-25 LAB — VITAMIN D PNL(25-HYDRXY+1,25-DIHY)-BLD
VIT D 1 25 DIHYDROXY: 41.5 pg/mL (ref 19.9–79.3)
VIT D 25 HYDROXY: 27.4 ng/mL — AB (ref 30.0–100.0)

## 2015-11-25 MED ORDER — LIDOCAINE HCL (PF) 1 % IJ SOLN
INTRAMUSCULAR | Status: AC
  Administered 2015-11-25: 09:00:00
  Filled 2015-11-25: qty 10

## 2015-11-25 MED ORDER — LIDOCAINE HCL (PF) 1 % IJ SOLN
INTRAMUSCULAR | Status: DC
Start: 2015-11-25 — End: 2015-11-25
  Filled 2015-11-25: qty 5

## 2015-11-25 MED ORDER — SODIUM HYALURONATE (VISCOSUP) 20 MG/2ML IX SOSY
2.0000 mL | PREFILLED_SYRINGE | Freq: Once | INTRA_ARTICULAR | Status: AC
  Administered 2015-11-25: 2 mL via INTRA_ARTICULAR
  Filled 2015-11-25: qty 2

## 2015-11-25 MED ORDER — LIDOCAINE HCL (PF) 1 % IJ SOLN: 10.0000 mL | Freq: Once | INTRAMUSCULAR | Status: DC

## 2015-11-25 NOTE — Patient Instructions (Signed)
Knee Injection A knee injection is a procedure to get medicine into your knee joint. Your health care provider puts a needle into the joint and injects medicine with an attached syringe. The injected medicine may relieve the pain, swelling, and stiffness of arthritis. The injected medicine may also help to lubricate and cushion your knee joint. You may need more than one injection. LET [facility] CARE PROVIDER KNOW ABOUT:  Any allergies you have.  All medicines you are taking, including vitamins, herbs, eye drops, creams, and over-the-counter medicines.  Previous problems you or members of your family have had with the use of anesthetics.  Any blood disorders you have.  Previous surgeries you have had.  Any medical conditions you may have. RISKS AND COMPLICATIONS Generally, this is a safe procedure. However, problems may occur, including:  Infection.  Bleeding.  Worsening symptoms.  Damage to the area around your knee.  Allergic reaction to any of the medicines.  Skin reactions from repeated injections. BEFORE THE PROCEDURE  Ask your health care provider about changing or stopping your regular medicines. This is especially important if you are taking diabetes medicines or blood thinners.  Plan to have someone take you home after the procedure. PROCEDURE  You will sit or lie down in a position for your knee to be treated.  The skin over your kneecap will be cleaned with a germ-killing solution (antiseptic).  You will be given a medicine that numbs the area (local anesthetic). You may feel some stinging.  After your knee becomes numb, you will have a second injection. This is the medicine. This needle is carefully placed between your kneecap and your knee. The medicine is injected into the joint space.  At the end of the procedure, the needle will be removed.  A bandage (dressing) may be placed over the injection site. The procedure may vary among health care providers  and hospitals. AFTER THE PROCEDURE  You may have to move your knee through its full range of motion. This helps to get all of the medicine into your joint space.  Your blood pressure, heart rate, breathing rate, and blood oxygen level will be monitored often until the medicines you were given have worn off.  You will be watched to make sure that you do not have a reaction to the injected medicine.   This information is not intended to replace advice given to you by your health care provider. Make sure you discuss any questions you have with your health care provider.   Document Released: 11/21/2006 Document Revised: 09/20/2014 Document Reviewed: 07/10/2014 Elsevier Interactive Patient Education 2016 [employer] Pain Management Discharge Instructions  General Discharge Instructions :  If you need to reach your doctor call: Monday-Friday 8:00 am - 4:00 pm at [phone] or toll free [phone].  After clinic hours [phone] to have operator reach doctor.  Bring all of your medication bottles to all your appointments in the pain clinic.  To cancel or reschedule your appointment with Pain Management please remember to call 24 hours in advance to avoid a fee.  Refer to the educational materials which you have been given on: General Risks, I had my Procedure. Discharge Instructions, Post Sedation.  Post Procedure Instructions:  The drugs you were given will stay in your system until tomorrow, so for the next 24 hours you should not drive, make any legal decisions or drink any alcoholic beverages.  You may eat anything you prefer, but it is better to start with liquids then  soups and crackers, and gradually work up to solid foods.  Please notify your doctor immediately if you have any unusual bleeding, trouble breathing or pain that is not related to your normal pain.  Depending on the type of procedure that was done, some parts of your body may feel week and/or numb.  This  usually clears up by tonight or the next day.  Walk with the use of an assistive device or accompanied by an adult for the 24 hours.  You may use ice on the affected area for the first 24 hours.  Put ice in a Ziploc bag and cover with a towel and place against area 15 minutes on 15 minutes off.  You may switch to heat after 24 hours. 

## 2015-11-25 NOTE — Progress Notes (Signed)
Safety precautions to be maintained throughout the outpatient stay will include: orient to surroundings, keep bed in low position, maintain call bell within reach at all times, provide assistance with transfer out of bed and ambulation.  

## 2015-11-25 NOTE — Progress Notes (Signed)
Patient's Name: Karen Lewis MRN: [id number] DOB: 1971/07/24 DOS: 11/25/2015  Primary Reason(s) for Visit: Interventional Pain Management Treatment. CC: Knee Pain   Pre-Procedure Assessment:  Ms. Fowers is a 45 y.o. year old, female patient, seen today for interventional treatment. 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); Adjustment disorder with depressed mood; Essential (primary) hypertension; Cannot sleep; Moderate episode of recurrent major depressive disorder ([place]); Goiter, nontoxic, multinodular; Chronic knee pain (Bilateral) (Location of Primary Source of Pain); Osteoarthritis of knee (Bilateral); Chronic low back pain (R>L); Lumbar facet syndrome (Bilateral) (R>L); Osteoarthrosis; Grade 1 (1.4 cm) Anterolisthesis of L4 over L5; Chronic hip pain (Bilateral) (Left total hip replacement); History of total left hip replacement; History of methicillin resistant staphylococcus aureus (MRSA); Vitamin D deficiency; History of bariatric surgery; Hypomagnesemia; Morbid obesity with BMI of 70 and over, adult ([place]) (71.75 on 08/27/2015); Congenital spondylolisthesis; History of methicillin resistant Staphylococcus aureus infection; History of  artificial joint; Lumbar spondylosis; Osteoarthritis of hips (Bilateral); Controlled drug dependence ([place]); Diabetes mellitus ([place]); Morbid obesity ([place]); Opioid dependence ([place]); and Thyroid goiter on her problem list.. Her primarily concern today is the Knee Pain   Today's Initial Pain Score: 6/10 Reported level of pain is incompatible with clinical obrservations. This may be secondary to a possible lack of understanding on how the pain scale works. Pain Type: Chronic pain Pain Location: Knee Pain Orientation: Right, Left Pain Descriptors / Indicators: Aching, Sore Pain Frequency: Constant  Post-procedure Pain Score:  (L =0 R =2)  Date of Last Visit: 11/24/15 Service Provided on Last Visit: Med Refill  Verification of the correct person, correct site (including marking of site), and correct procedure were performed and confirmed by the patient.  Today's Vitals   11/25/15 0932 11/25/15 0937 11/25/15 0942 11/25/15 0945  BP: 120/74 126/74 119/67 130/88  Pulse: 95 94 94 95  Temp:      Resp: _0 Height:      Weight:      SpO2: 96% 95% 96% 95%  PainSc:    2   PainLoc:      Calculated BMI: Body mass index is 71.89 kg/(m^2). Allergies: She is allergic to xolair; bactrim; aspirin; ciprofloxacin; clindamycin; motrin; sulfa antibiotics; and nsaids.. Primary Diagnosis: Primary osteoarthritis of both knees [M17.0]  Procedure:  Type: Therapeutic Intra-Articular Hyalgan Knee Injection #2 Region:  Knee Region (medial on the right knee and lateral on the left knee) Level: Knee Joint Laterality: Bilateral  Indications: 1. Primary osteoarthritis of both knees   2. Chronic knee pain (Bilateral) (Location of Primary Source of Pain)     In addition, Ms. An has Fibromyalgia; Chronic pain; Chronic knee pain (Bilateral) (Location of Primary Source of Pain); Osteoarthritis of knee (Bilateral); Chronic low back pain (R>L); Lumbar facet syndrome (Bilateral) (R>L); Osteoarthrosis; Grade 1  (1.4 cm) Anterolisthesis of L4 over L5; Chronic hip pain (Bilateral) (Left total hip replacement); Hypomagnesemia; Lumbar spondylosis; and Osteoarthritis of hips (Bilateral)  on her pertinent problem list.  Consent: Secured. Under the influence of no sedatives a written informed consent was obtained, after having provided information on the risks and possible complications. To fulfill our ethical and legal obligations, as recommended by the American Medical Association's Code of Ethics, we have provided information to the patient about our clinical impression; the nature and purpose of the treatment or procedure; the risks, benefits, and possible complications of the intervention; alternatives; the risk(s) and benefit(s) of the alternative treatment(s) or procedure(s); and the risk(s) and benefit(s) of doing nothing. The patient was provided information about the risks and possible complications associated with the procedure. These include, but are not limited to, failure to achieve desired goals, infection, bleeding, organ or nerve damage, allergic reactions, paralysis, and death. In the case of intra- or periarticular procedures these may include, but are not limited to, failure to achieve desired goals, infection, bleeding (hemarthrosis), organ or nerve damage, allergic reactions, and death. In addition, the patient was informed that Medicine is not an exact science; therefore, there is also the possibility of unforeseen risks and possible complications that may result in a catastrophic outcome. The patient indicated having understood very clearly. We have given the patient no guarantees and we have made no promises. Enough time was given to the patient to ask questions, all of which were answered to the patient's satisfaction.  Pre-Procedure Preparation: Safety Precautions: Allergies reviewed. Appropriate site, procedure, and patient were confirmed by following the Joint Commission's Universal Protocol  (UP.01.01.01), in the form of a "Time Out". The patient was asked to confirm marked site and procedure, before commencing. The patient was asked about blood thinners, or active infections, both of which were denied. Patient was assessed for positional comfort and all pressure points were checked before starting procedure. Monitoring:  As per clinic protocol. Infection Control Precautions: Sterile technique used. Standard Universal Precautions were taken as recommended by the Department of [facility] for Disease Control and Prevention (CDC). Standard pre-surgical skin prep was conducted. Respiratory hygiene and cough etiquette was practiced. Hand hygiene observed. Safe injection practices and needle disposal techniques followed. SDV (single dose vial) medications used. Medications properly checked for expiration dates and contaminants. Personal protective equipment (PPE) used: Sterile surgical gloves.  Anesthesia, Analgesia, Anxiolysis: Type: Local Anesthesia Local Anesthetic: Lidocaine 1% Route: Infiltration ([place]/IM) IV Access: Declined Sedation: Declined  Indication(s): Analgesia    Description of Procedure Process:  Time-out: "Time-out" completed before starting procedure, as per protocol. Position: Sitting (reclined in a semi-Fowler's position) Target Area: Knee Joint Approach: Lateral approach for the left knee and medial approach for the right knee. Area Prepped: Entire knee area, from the mid-thigh to the mid-shin. Prepping solution: ChloraPrep (2% chlorhexidine gluconate and 70% isopropyl alcohol) Safety Precautions: Aspiration looking for blood return was conducted prior to all injections. At no point did we inject any substances, as a needle was being advanced. No attempts were made at seeking any paresthesias. Safe injection practices and needle disposal techniques used. Medications properly checked for expiration dates. SDV (single dose vial) medications used.    Description  of the Procedure: Protocol guidelines were followed. The patient was placed in position over the fluoroscopy table. The target area was identified and the area prepped in the usual manner. Skin desensitized using vapocoolant spray. Skin & deeper tissues infiltrated with local anesthetic. Appropriate amount of time allowed to pass for local anesthetics to take effect. The procedure needles were then advanced to the target area. Proper needle placement  secured. Negative aspiration confirmed. Solution injected in intermittent fashion, asking for systemic symptoms every 0.5cc of injectate. The needles were then removed and the area cleansed, making sure to leave some of the prepping solution back to take advantage of its long term bactericidal properties. EBL: None Materials & Medications Used:  Needle(s) Used: 22g - 1.5" Needle(s) Medications Administered today: We administered Sodium Hyaluronate and lidocaine (PF).Please see chart orders for dosing details.  Imaging Guidance:  Type of Imaging Technique: None Indication(s): Not Applicable Exposure Time: Please see nurses notes. Contrast: None used. Fluoroscopic Guidance: I was personally present in the fluoroscopy suite, where the patient was placed in position for the procedure, over the fluoroscopy-compatible table. Fluoroscopy was manipulated, using "Tunnel Vision Technique", to obtain the best possible view of the target area, on the affected side. Parallax error was corrected before commencing the procedure. A "direction-depth-direction" technique was used to introduce the needle under continuous pulsed fluoroscopic guidance. Once the target was reached, antero-posterior, oblique, and lateral fluoroscopic projection views were taken to confirm needle placement in all planes. Permanently recorded images stored by scanning into EMR. Interpretation: Intraoperative imaging interpretation by performing Physician. Adequate needle placement confirmed.  Adequate needle placement confirmed in AP, lateral, & Oblique Views. No contrast injected. Permanent hardcopy images in multiple planes scanned into the patient's record.  Antibiotic Prophylaxis:  Indication(s): No indications identified. Type:  Antibiotics Given (last 72 hours)    None       Post-operative Assessment:  Complications: No immediate post-treatment complications were observed. Disposition: Return to clinic for follow-up evaluation. The patient tolerated the entire procedure well. A repeat set of vitals were taken after the procedure and the patient was kept under observation following institutional policy, for this type of procedure. The patient was discharged home, once institutional criteria were met. The patient was provided with post-procedure discharge instructions, including a section on how to identify potential problems. Should any problems arise concerning this procedure, the patient was given instructions to immediately contact us, at any time, without hesitation. In any case, we plan to contact the patient by telephone for a follow-up status report regarding this interventional procedure. Comments:  No additional relevant information. Return to clinic in 2 weeks for Hyalgan injection #3.  Medications administered during this visit: We administered Sodium Hyaluronate and lidocaine (PF).  Future Appointments Date Time Provider [place]  12/09/2015 8:40 AM Milinda Pointer, MD [facility] None  02/23/2016 10:40 AM Milinda Pointer, MD [facility] None    Primary Care Physician: Idelle Crouch, MD Location: [facility] Note by: Kathlen Brunswick. [name], M.D, DABA, DABAPM, DABPM, DABIPP, FIPP  Disclaimer:  Medicine is not an exact science. The only guarantee in medicine is that nothing is guaranteed. It is important to note that the decision to proceed with this intervention was based on the information collected from the patient. The  Data and conclusions were drawn from the patient's questionnaire, the interview, and the physical examination. Because the information was provided in large part by the patient, it cannot be guaranteed that it has not been purposely or unconsciously manipulated. Every effort has been made to obtain as much relevant data as possible for this evaluation. It is important to note that the conclusions that lead to this procedure are derived in large part from the available data. Always take into account that the treatment will also be dependent on availability of resources and existing treatment guidelines, considered by other Pain Management Practitioners as being common knowledge and  practice, at the time of the intervention. For Medico-Legal purposes, it is also important to point out that variation in procedural techniques and pharmacological choices are the acceptable norm. The indications, contraindications, technique, and results of the above procedure should only be interpreted and judged by a Board-Certified Interventional Pain Specialist with extensive familiarity and expertise in the same exact procedure and technique. Attempts at providing opinions without similar or greater experience and expertise than that of the treating physician will be considered as inappropriate and unethical, and shall result in a formal complaint to the state medical board and applicable specialty societies. Daisyreviewed the middle ear

## 2015-11-26 ENCOUNTER — Telehealth: Payer: Self-pay

## 2015-11-26 NOTE — Telephone Encounter (Signed)
Denies complications post procedure. 

## 2015-11-27 ENCOUNTER — Other Ambulatory Visit: Payer: Self-pay | Admitting: Pain Medicine

## 2015-11-27 DIAGNOSIS — E559 Vitamin D deficiency, unspecified: Secondary | ICD-10-CM

## 2015-11-27 MED ORDER — VITAMIN D (ERGOCALCIFEROL) 1.25 MG (50000 UNIT) PO CAPS: ORAL_CAPSULE | ORAL | Status: DC

## 2015-11-27 MED ORDER — VITAMIN D3 50 MCG (2000 UT) PO CAPS: ORAL_CAPSULE | ORAL | Status: DC

## 2015-11-27 NOTE — Progress Notes (Signed)
Quick Note:   Normal fasting (NPO x 8 hours) glucose levels are between 65-99 mg/dl, with 2 hour fasting, levels are usually less than 140 mg/dl. Any random blood glucose level greater than 200 mg/dl is considered to be Diabetes.  Normal calcium levels are between 9.0 and 10.5 mg/dl. The most common cause of low total calcium is: Low blood protein levels, especially a low level of albumin, which can result from liver disease or malnutrition, both of which may result from alcoholism or other illnesses. Low albumin is also very common in people who are acutely ill. With low albumin, only the bound calcium is low. Ionized calcium remains normal, and calcium metabolism is being regulated appropriately. Some other causes of hypocalcemia include: Underactive parathyroid gland (hypoparathyroidism); Inherited resistance to the effects of parathyroid hormone; Extreme deficiency in dietary calcium; Decreased levels of vitamin D; Magnesium deficiency; Increased levels of phosphorus; Acute inflammation of the pancreas (pancreatitis); & Renal failure.  ______

## 2015-11-27 NOTE — Progress Notes (Signed)
Quick Note:  Normal Magnesium levels are between 1.6 and 2.3 mEq/L. Low magnesium blood level can lead to low calcium and potassium levels. Low levels may indicate inadequate dietary consuming, poor absorbtion, or excessive excretion. Signs and symptoms may include: leg cramps, foot pain, muscle twitches, loss of appetite, nausea, vomiting, fatigue, weakness, numbness, tingling, seizures, personality changes, abnormal heart rhythms, and/or coronary artery spasms.  ______ 

## 2015-11-27 NOTE — Progress Notes (Signed)

## 2015-11-27 NOTE — Progress Notes (Signed)
Quick Note:   A normal sedimentation rate should be below 30 mm/hr. The sed rate is an acute phase reactant that indirectly measures the degree of inflammation present in the body. It can be acute, developing rapidly after trauma, injury or infection, for example, or can occur over an extended time (chronic) with conditions such as autoimmune diseases or cancer. The ESR is not diagnostic; it is a non-specific, screening test that may be elevated in a number of these different conditions. It provides general information about the presence or absence of an inflammatory condition.  ______

## 2015-12-02 ENCOUNTER — Telehealth: Payer: Self-pay | Admitting: Pain Medicine

## 2015-12-02 NOTE — Telephone Encounter (Signed)
Need to clarify directions for gabapentin, please call asap, they have been trying to get this since 11-21-15

## 2015-12-03 NOTE — Telephone Encounter (Signed)
Pharmacy wanted to clarify dose of Gabapentin, max dose per day is 3600mg . Dr. [name] notified. Gabapentin 600mg  prescription discontinued.

## 2015-12-05 ENCOUNTER — Telehealth: Payer: Self-pay | Admitting: Pain Medicine

## 2015-12-05 NOTE — Telephone Encounter (Signed)
Wants to speak with nurse about meds please call at [phone]

## 2015-12-05 NOTE — Telephone Encounter (Signed)
Left message to call office

## 2015-12-09 ENCOUNTER — Ambulatory Visit: Payer: Commercial Managed Care - HMO | Admitting: Pain Medicine

## 2015-12-11 ENCOUNTER — Ambulatory Visit: Payer: Commercial Managed Care - HMO | Admitting: Pain Medicine

## 2015-12-18 ENCOUNTER — Ambulatory Visit: Payer: Commercial Managed Care - HMO | Admitting: Pain Medicine

## 2015-12-23 ENCOUNTER — Ambulatory Visit: Payer: Commercial Managed Care - HMO | Admitting: Pain Medicine

## 2015-12-31 DIAGNOSIS — E041 Nontoxic single thyroid nodule: Secondary | ICD-10-CM | POA: Diagnosis not present

## 2015-12-31 DIAGNOSIS — R1314 Dysphagia, pharyngoesophageal phase: Secondary | ICD-10-CM | POA: Diagnosis not present

## 2015-12-31 DIAGNOSIS — K219 Gastro-esophageal reflux disease without esophagitis: Secondary | ICD-10-CM | POA: Diagnosis not present

## 2016-01-06 ENCOUNTER — Ambulatory Visit: Payer: Commercial Managed Care - HMO | Admitting: Pain Medicine

## 2016-01-12 ENCOUNTER — Telehealth: Payer: Self-pay | Admitting: Pain Medicine

## 2016-01-12 ENCOUNTER — Other Ambulatory Visit: Payer: Self-pay

## 2016-01-12 DIAGNOSIS — E559 Vitamin D deficiency, unspecified: Secondary | ICD-10-CM

## 2016-01-12 MED ORDER — VITAMIN D (ERGOCALCIFEROL) 1.25 MG (50000 UNIT) PO CAPS: ORAL_CAPSULE | ORAL | Status: DC

## 2016-01-12 MED ORDER — VITAMIN D3 50 MCG (2000 UT) PO CAPS: ORAL_CAPSULE | ORAL | Status: DC

## 2016-01-12 NOTE — Telephone Encounter (Signed)
Patient states [facility] pharmacy does not do vitamins and we needed to change her pharmacy to [facility] for these medications.  Vitamin D2 and D3 reordered to [facility].

## 2016-01-12 NOTE — Telephone Encounter (Signed)
Called regarding orders sent to pharmacy, would like to speak with nurse

## 2016-01-13 DIAGNOSIS — H524 Presbyopia: Secondary | ICD-10-CM | POA: Diagnosis not present

## 2016-01-13 DIAGNOSIS — H5210 Myopia, unspecified eye: Secondary | ICD-10-CM | POA: Diagnosis not present

## 2016-01-14 ENCOUNTER — Ambulatory Visit (INDEPENDENT_AMBULATORY_CARE_PROVIDER_SITE_OTHER): Payer: Commercial Managed Care - HMO | Admitting: Psychiatry

## 2016-01-14 ENCOUNTER — Encounter: Payer: Self-pay | Admitting: Psychiatry

## 2016-01-14 VITALS — BP 138/88 | HR 125 | Temp 97.6°F | Ht 64.0 in | Wt >= 6400 oz

## 2016-01-14 DIAGNOSIS — G4733 Obstructive sleep apnea (adult) (pediatric): Secondary | ICD-10-CM | POA: Diagnosis not present

## 2016-01-14 DIAGNOSIS — E042 Nontoxic multinodular goiter: Secondary | ICD-10-CM | POA: Diagnosis not present

## 2016-01-14 DIAGNOSIS — R1310 Dysphagia, unspecified: Secondary | ICD-10-CM | POA: Diagnosis not present

## 2016-01-14 DIAGNOSIS — E1142 Type 2 diabetes mellitus with diabetic polyneuropathy: Secondary | ICD-10-CM | POA: Diagnosis not present

## 2016-01-14 DIAGNOSIS — Z1239 Encounter for other screening for malignant neoplasm of breast: Secondary | ICD-10-CM | POA: Diagnosis not present

## 2016-01-14 DIAGNOSIS — F411 Generalized anxiety disorder: Secondary | ICD-10-CM | POA: Diagnosis not present

## 2016-01-14 DIAGNOSIS — E782 Mixed hyperlipidemia: Secondary | ICD-10-CM | POA: Diagnosis not present

## 2016-01-14 DIAGNOSIS — F331 Major depressive disorder, recurrent, moderate: Secondary | ICD-10-CM | POA: Diagnosis not present

## 2016-01-14 DIAGNOSIS — I10 Essential (primary) hypertension: Secondary | ICD-10-CM | POA: Diagnosis not present

## 2016-01-14 DIAGNOSIS — E7800 Pure hypercholesterolemia, unspecified: Secondary | ICD-10-CM | POA: Diagnosis not present

## 2016-01-14 DIAGNOSIS — J4531 Mild persistent asthma with (acute) exacerbation: Secondary | ICD-10-CM | POA: Diagnosis not present

## 2016-01-14 DIAGNOSIS — M797 Fibromyalgia: Secondary | ICD-10-CM | POA: Diagnosis not present

## 2016-01-14 MED ORDER — LISDEXAMFETAMINE DIMESYLATE 70 MG PO CAPS: 70.0000 mg | ORAL_CAPSULE | ORAL | Status: DC

## 2016-01-14 MED ORDER — QUETIAPINE FUMARATE 100 MG PO TABS: 100.0000 mg | ORAL_TABLET | Freq: Every day | ORAL | Status: DC

## 2016-01-14 MED ORDER — SERTRALINE HCL 100 MG PO TABS: 200.0000 mg | ORAL_TABLET | Freq: Every day | ORAL | Status: DC

## 2016-01-14 MED ORDER — SERTRALINE HCL 100 MG PO TABS
200.0000 mg | ORAL_TABLET | Freq: Every day | ORAL | Status: DC
Start: 2016-01-14 — End: 2016-05-04

## 2016-01-14 NOTE — Progress Notes (Signed)
Patient ID: Karen Lewis, female   DOB: [birth year], 45 y.o.   MRN: [id number]  [facility] MD/PA/NP OP Progress Note  01/14/2016 9:18 AM Karen Lewis  MRN:  [id number]  Subjective:  Patient is a 45 yo female with MDD and binge eating disorder. Patient returns for a follow-up appointment today. She states that she ran out of her medication of Vyvanse but was unable to come back because of the co-pay. States that she continues to struggle with financial issues. States that she has been binging almost every day. She is been taking the Zoloft regularly at 20 mg. States that she cut the Seroquel down to 100 mg but it has not helped with her energy levels. States that her mood is overall stable and it's the circumstances of the past 3 years ago caused her to be so depressed. Patient then talks about her her mom and her stepmom were diagnosed with cancer and how they died within the last 3 years and how it has been very stressful for her. Patient also reports frustration at her medications being changed and not being able to obtain any benzodiazepines. Patient was educated about the addictive nature of the benzodiazepines and how they can lead to other problems.   Chief Complaint: bingeing daily Chief Complaint    Follow-up; Medication Refill; Weight Gain     Visit Diagnosis:     ICD-9-CM ICD-10-CM   1. Major depressive disorder, recurrent episode, moderate (HCC) 296.32 F33.1   2. GAD (generalized anxiety disorder) 300.02 F41.1     Past Medical History:  Past Medical History  Diagnosis Date  . Anginal pain ([place])   . Hypertension   . Dysrhythmia   . Asthma   . Sleep apnea   . Thyroid nodule bilateral  . Diabetes mellitus without complication ([place])   . Depression   . Anxiety   . GERD (gastroesophageal reflux disease)   . History of hiatal hernia   . Headache   . Neuromuscular disorder ([place])   . Fibromyalgia   . Arthritis   . Anemia   . Obesity   . H/O cardiovascular disorder 03/10/2015  . H/O  thyroid disease 03/10/2015  . H/O surgical procedure 12/05/2012    Overview:  LSG ([facility] - April 2013)    . Arthralgia of hip 07/29/2015  . Arthritis, degenerative 07/29/2015  . Gonalgia 07/29/2015    Overview:  Overview:  The patient has had bilateral intra-articular Hyalgan injections done on 07/16/2014 and although she seems to do well with this type of therapy, apparently her insurance company does not want to pay for they Hyalgan. On 11/27/2014 the patient underwent a bilateral genicular nerve block with excellent results. On 01/28/2015 she had a right knee genicular radiofrequency ablatio  . Cephalalgia 07/25/2014  . History of artificial joint 07/29/2015  . LBP (low back pain) 07/29/2015  . Primary osteoarthritis of both knees 07/29/2015  . Dependence on unknown drug ([place])     multiplt controlled drug dependence  . Hypomagnesemia   . Gout   . Herpes   . PCOS (polycystic ovarian syndrome)   . Eczema   . Hypothyroidism     Past Surgical History  Procedure Laterality Date  . Laparoscopic partial gastrectomy    . Shoulder arthroscopy Right   . Joint replacement Left hip  . Carpal tunnel release Bilateral   . Diagnostic laparoscopy    . Cholecystectomy    . Trigger finger release Right   . Thyroidectomy N/A 11/12/2015  Procedure: THYROIDECTOMY;  Surgeon: Clyde Canterbury, MD;  Location: [facility] ORS;  Service: ENT;  Laterality: N/A;   Family History:  Family History  Problem Relation Age of Onset  . Anxiety disorder Mother   . Depression Mother   . Alcohol abuse Mother   . Diabetes Mother   . Hypertension Mother   . Kidney cancer Mother   . Sleep apnea Mother   . Alcohol abuse Father   . Anxiety disorder Father   . Depression Father   . Post-traumatic stress disorder Father   . Kidney failure Father   . COPD Father   . Diabetes Father   . Hypertension Father   . Sleep apnea Father   . Depression Brother   . Diabetes Brother   . Hypertension Brother   . Sleep apnea Brother     Social History:  Social History   Social History  . Marital Status: Single    Spouse Name: N/A  . Number of Children: N/A  . Years of Education: N/A   Social History Main Topics  . Smoking status: Never Smoker   . Smokeless tobacco: Never Used  . Alcohol Use: No  . Drug Use: No  . Sexual Activity: Not Currently   Other Topics Concern  . None   Social History Narrative   Additional History:   Assessment:   Musculoskeletal: Strength & Muscle Tone: within normal limits Gait & Station: normal Patient leans: N/A  Psychiatric Specialty Exam: HPI  Review of Systems  Psychiatric/Behavioral: Negative for depression, suicidal ideas, hallucinations, memory loss and substance abuse. The patient is not nervous/anxious and does not have insomnia.   All other systems reviewed and are negative.   Blood pressure 138/88, pulse 125, temperature 97.6 F (36.4 C), temperature source Tympanic, height 5\' 4"  (1.626 m), weight 433 lb (196.408 kg), SpO2 96 %.Body mass index is 74.29 kg/(m^2).  General Appearance: Well Groomed  Eye Contact:  Good  Speech:  Normal Rate  Volume:  Normal  Mood:  good  Affect: normal  Thought Process:  Linear and Logical  Orientation:  Full (Time, Place, and Person)  Thought Content:  Negative  Suicidal Thoughts:  No  Homicidal Thoughts:  No  Memory:  Immediate;   Good Recent;   Good Remote;   Good  Judgement:  Good  Insight:  Good  Psychomotor Activity:  Negative  Concentration:  Good  Recall:  Good  Fund of Knowledge: Good  Language: Good  Akathisia:  Negative  Handed:  Right  AIMS (if indicated):   Assets:  Communication Skills Desire for Improvement  ADL's:  Intact  Cognition: WNL  Sleep:  fair   Is the patient at risk to self?  No. Has the patient been a risk to self in the past 6 months?  No. Has the patient been a risk to self within the distant past?  No. Is the patient a risk to others?  No. Has the patient been a risk to others  in the past 6 months?  No. Has the patient been a risk to others within the distant past?  No.  Current Medications: Current Outpatient Prescriptions  Medication Sig Dispense Refill  . acyclovir (ZOVIRAX) 800 MG tablet Take 800 mg by mouth 2 (two) times daily. Reported on 11/12/2015    . albuterol (PROVENTIL HFA;VENTOLIN HFA) 108 (90 BASE) MCG/ACT inhaler Inhale 2 puffs into the lungs every 6 (six) hours as needed for wheezing or shortness of breath.    Marland Kitchen amitriptyline (ELAVIL)  100 MG tablet Take 100 mg by mouth at bedtime.     Marland Kitchen atorvastatin (LIPITOR) 40 MG tablet Take 40 mg by mouth daily at 6 PM.     . butalbital-acetaminophen-caffeine (FIORICET, ESGIC) 50-325-40 MG tablet 1 tablet every 6 (six) hours as needed.     . calcium-vitamin D (OSCAL WITH D) 500-200 MG-UNIT tablet Take 1 tablet by mouth 3 (three) times daily. 60 tablet 0  . cetirizine (ZYRTEC) 5 MG tablet Take 5 mg by mouth daily.     . Cholecalciferol (VITAMIN D3) 2000 units capsule Take 1 capsule (2,000 Units total) by mouth daily. 30 capsule PRN  . clonazePAM (KLONOPIN) 0.5 MG tablet Take 1 tablet (0.5 mg total) by mouth 2 (two) times daily as needed for anxiety. 60 tablet 2  . Fluticasone-Salmeterol (ADVAIR DISKUS) 500-50 MCG/DOSE AEPB Inhale 1 puff into the lungs 2 (two) times daily.     Marland Kitchen gabapentin (NEURONTIN) 600 MG tablet Take 1 tablet (600 mg total) by mouth every 6 (six) hours. 360 tablet 0  . gabapentin (NEURONTIN) 800 MG tablet Take 1 tablet (800 mg total) by mouth 6 (six) times daily. 540 tablet 0  . ipratropium-albuterol (DUONEB) 0.5-2.5 (3) MG/3ML SOLN     . ketoconazole (NIZORAL) 2 % cream APPLY AS DIRECTED TO AFFECTED AREA TWICE DAILY AS NEEDED    . levothyroxine (SYNTHROID, LEVOTHROID) 125 MCG tablet Take 1 tablet (125 mcg total) by mouth daily before breakfast. 30 tablet 3  . lisdexamfetamine (VYVANSE) 70 MG capsule Take 1 capsule (70 mg total) by mouth every morning. 30 capsule 0  . magnesium oxide (MAG-OX) 400 MG  tablet Take 1 tablet (400 mg total) by mouth 2 (two) times daily. 60 tablet 2  . meclizine (ANTIVERT) 25 MG tablet Take 1 tablet (25 mg total) by mouth 3 (three) times daily as needed for dizziness. 30 tablet 0  . medroxyPROGESTERone (DEPO-PROVERA) 150 MG/ML injection     . metFORMIN (GLUCOPHAGE) 1000 MG tablet Take 1,000 mg by mouth 2 (two) times daily with a meal.     . metoprolol (LOPRESSOR) 50 MG tablet Take 50 mg by mouth 2 (two) times daily.     . montelukast (SINGULAIR) 10 MG tablet Take 10 mg by mouth at bedtime.     . Multiple Vitamin (MULTIVITAMIN WITH MINERALS) TABS tablet Take 2 tablets by mouth daily. Reported on 11/12/2015    . nystatin (MYCOSTATIN) 100000 UNIT/ML suspension     . omeprazole (PRILOSEC) 40 MG capsule Take 40 mg by mouth 2 (two) times daily.     Marland Kitchen oxyCODONE-acetaminophen (PERCOCET/ROXICET) 5-325 MG tablet Take 1 tablet by mouth every 6 (six) hours as needed for severe pain.    Marland Kitchen QUEtiapine (SEROQUEL) 100 MG tablet Take 1 tablet (100 mg total) by mouth at bedtime. 45 tablet 0  . sertraline (ZOLOFT) 100 MG tablet Take 2 tablets (200 mg total) by mouth daily. 60 tablet 2  . SPIRIVA HANDIHALER 18 MCG inhalation capsule Reported on 11/12/2015    . spironolactone (ALDACTONE) 25 MG tablet Take 37.5 mg by mouth 2 (two) times daily.     Marland Kitchen tiZANidine (ZANAFLEX) 4 MG tablet     . triamcinolone (NASACORT) 55 MCG/ACT AERO nasal inhaler 2 sprays by Nasal route 2 (two) times daily.    . TRUE METRIX BLOOD GLUCOSE TEST test strip     . Vitamin D, Ergocalciferol, (DRISDOL) 50000 units CAPS capsule Take 1 capsule (50,000 Units total) by mouth 2 (two) times a week. X 6  weeks. 12 capsule 0  . zolpidem (AMBIEN) 10 MG tablet Take 1 tablet (10 mg total) by mouth at bedtime as needed for sleep. 30 tablet 2  . zonisamide (ZONEGRAN) 50 MG capsule Take 75 mg by mouth daily.      Current Facility-Administered Medications  Medication Dose Route Frequency Provider Last Rate Last Dose  . lidocaine  (PF) (XYLOCAINE) 1 % injection 10 mL  10 mL Infiltration Once Milinda Pointer, MD        Medical Decision Making:  Established Problem, Worsening (2), Review of Medication Regimen & Side Effects (2) and Review of New Medication or Change in Dosage (2)  Treatment Plan Summary:Medication management and Plan   Major depressive disorder Continue Sertraline at 200 mg daily.  Continue on the Seroquel 100 mg at bedtime.   Anxiety Same as above Continue Klonopin since it has not been prescribed by this clinician in the last 3 months and patient has not been taking it.   Binge Eating Disorder-Continue  Vyvanse 70 mg.  Continue to see therapist regularly and work on reducing her binging. Return to clinic in 3 months time or call before if necessary      Angelique Chevalier 01/14/2016, 9:18 AM

## 2016-01-15 DIAGNOSIS — G43719 Chronic migraine without aura, intractable, without status migrainosus: Secondary | ICD-10-CM | POA: Diagnosis not present

## 2016-01-19 ENCOUNTER — Telehealth: Payer: Self-pay

## 2016-01-19 DIAGNOSIS — M898X9 Other specified disorders of bone, unspecified site: Secondary | ICD-10-CM | POA: Diagnosis not present

## 2016-01-19 DIAGNOSIS — R350 Frequency of micturition: Secondary | ICD-10-CM | POA: Diagnosis not present

## 2016-01-19 DIAGNOSIS — L851 Acquired keratosis [keratoderma] palmaris et plantaris: Secondary | ICD-10-CM | POA: Diagnosis not present

## 2016-01-19 DIAGNOSIS — E1142 Type 2 diabetes mellitus with diabetic polyneuropathy: Secondary | ICD-10-CM | POA: Diagnosis not present

## 2016-01-19 NOTE — Telephone Encounter (Signed)
Doctor ordered cough syrup that has codeine in it pt wanted to make sure Dr. [name] was aware

## 2016-01-21 ENCOUNTER — Other Ambulatory Visit: Payer: Self-pay | Admitting: Internal Medicine

## 2016-01-21 DIAGNOSIS — Z1231 Encounter for screening mammogram for malignant neoplasm of breast: Secondary | ICD-10-CM

## 2016-01-27 DIAGNOSIS — H9203 Otalgia, bilateral: Secondary | ICD-10-CM | POA: Diagnosis not present

## 2016-02-02 ENCOUNTER — Ambulatory Visit: Payer: Commercial Managed Care - HMO

## 2016-02-16 ENCOUNTER — Ambulatory Visit
Admission: RE | Admit: 2016-02-16 | Discharge: 2016-02-16 | Disposition: A | Discharge status: Home / Self Care | Payer: Commercial Managed Care - HMO | Source: Ambulatory Visit | Attending: Internal Medicine | Admitting: Internal Medicine

## 2016-02-16 DIAGNOSIS — E1142 Type 2 diabetes mellitus with diabetic polyneuropathy: Secondary | ICD-10-CM | POA: Diagnosis not present

## 2016-02-16 DIAGNOSIS — Z1231 Encounter for screening mammogram for malignant neoplasm of breast: Secondary | ICD-10-CM | POA: Insufficient documentation

## 2016-02-17 ENCOUNTER — Other Ambulatory Visit: Payer: Self-pay | Admitting: Nurse Practitioner

## 2016-02-17 DIAGNOSIS — K219 Gastro-esophageal reflux disease without esophagitis: Secondary | ICD-10-CM | POA: Diagnosis not present

## 2016-02-17 DIAGNOSIS — R1310 Dysphagia, unspecified: Secondary | ICD-10-CM

## 2016-02-23 ENCOUNTER — Ambulatory Visit: Payer: Commercial Managed Care - HMO | Attending: Pain Medicine | Admitting: Pain Medicine

## 2016-02-23 ENCOUNTER — Encounter: Payer: Self-pay | Admitting: Pain Medicine

## 2016-02-23 VITALS — BP 162/82 | HR 103 | Temp 98.5°F | Resp 22 | Ht 64.0 in | Wt >= 6400 oz

## 2016-02-23 DIAGNOSIS — G43909 Migraine, unspecified, not intractable, without status migrainosus: Secondary | ICD-10-CM | POA: Diagnosis not present

## 2016-02-23 DIAGNOSIS — E782 Mixed hyperlipidemia: Secondary | ICD-10-CM | POA: Insufficient documentation

## 2016-02-23 DIAGNOSIS — M25569 Pain in unspecified knee: Secondary | ICD-10-CM | POA: Diagnosis present

## 2016-02-23 DIAGNOSIS — M25552 Pain in left hip: Secondary | ICD-10-CM | POA: Diagnosis not present

## 2016-02-23 DIAGNOSIS — Z79891 Long term (current) use of opiate analgesic: Secondary | ICD-10-CM

## 2016-02-23 DIAGNOSIS — Z8614 Personal history of Methicillin resistant Staphylococcus aureus infection: Secondary | ICD-10-CM | POA: Diagnosis not present

## 2016-02-23 DIAGNOSIS — M549 Dorsalgia, unspecified: Secondary | ICD-10-CM | POA: Diagnosis present

## 2016-02-23 DIAGNOSIS — M160 Bilateral primary osteoarthritis of hip: Secondary | ICD-10-CM | POA: Insufficient documentation

## 2016-02-23 DIAGNOSIS — Z9889 Other specified postprocedural states: Secondary | ICD-10-CM | POA: Diagnosis not present

## 2016-02-23 DIAGNOSIS — I10 Essential (primary) hypertension: Secondary | ICD-10-CM | POA: Insufficient documentation

## 2016-02-23 DIAGNOSIS — F5081 Binge eating disorder: Secondary | ICD-10-CM | POA: Diagnosis not present

## 2016-02-23 DIAGNOSIS — M170 Bilateral primary osteoarthritis of knee: Secondary | ICD-10-CM

## 2016-02-23 DIAGNOSIS — J45909 Unspecified asthma, uncomplicated: Secondary | ICD-10-CM | POA: Diagnosis not present

## 2016-02-23 DIAGNOSIS — E119 Type 2 diabetes mellitus without complications: Secondary | ICD-10-CM | POA: Insufficient documentation

## 2016-02-23 DIAGNOSIS — E785 Hyperlipidemia, unspecified: Secondary | ICD-10-CM | POA: Insufficient documentation

## 2016-02-23 DIAGNOSIS — G4733 Obstructive sleep apnea (adult) (pediatric): Secondary | ICD-10-CM | POA: Insufficient documentation

## 2016-02-23 DIAGNOSIS — Z5181 Encounter for therapeutic drug level monitoring: Secondary | ICD-10-CM

## 2016-02-23 DIAGNOSIS — M25562 Pain in left knee: Secondary | ICD-10-CM

## 2016-02-23 DIAGNOSIS — Z9884 Bariatric surgery status: Secondary | ICD-10-CM | POA: Diagnosis not present

## 2016-02-23 DIAGNOSIS — M109 Gout, unspecified: Secondary | ICD-10-CM | POA: Insufficient documentation

## 2016-02-23 DIAGNOSIS — M25551 Pain in right hip: Secondary | ICD-10-CM | POA: Insufficient documentation

## 2016-02-23 DIAGNOSIS — L309 Dermatitis, unspecified: Secondary | ICD-10-CM | POA: Insufficient documentation

## 2016-02-23 DIAGNOSIS — M4316 Spondylolisthesis, lumbar region: Secondary | ICD-10-CM | POA: Diagnosis not present

## 2016-02-23 DIAGNOSIS — F339 Major depressive disorder, recurrent, unspecified: Secondary | ICD-10-CM | POA: Insufficient documentation

## 2016-02-23 DIAGNOSIS — E042 Nontoxic multinodular goiter: Secondary | ICD-10-CM | POA: Diagnosis not present

## 2016-02-23 DIAGNOSIS — F411 Generalized anxiety disorder: Secondary | ICD-10-CM | POA: Insufficient documentation

## 2016-02-23 DIAGNOSIS — M797 Fibromyalgia: Secondary | ICD-10-CM

## 2016-02-23 DIAGNOSIS — M25561 Pain in right knee: Secondary | ICD-10-CM

## 2016-02-23 DIAGNOSIS — E282 Polycystic ovarian syndrome: Secondary | ICD-10-CM | POA: Diagnosis not present

## 2016-02-23 DIAGNOSIS — M47816 Spondylosis without myelopathy or radiculopathy, lumbar region: Secondary | ICD-10-CM

## 2016-02-23 DIAGNOSIS — M25579 Pain in unspecified ankle and joints of unspecified foot: Secondary | ICD-10-CM | POA: Diagnosis present

## 2016-02-23 DIAGNOSIS — E559 Vitamin D deficiency, unspecified: Secondary | ICD-10-CM | POA: Diagnosis not present

## 2016-02-23 DIAGNOSIS — K219 Gastro-esophageal reflux disease without esophagitis: Secondary | ICD-10-CM | POA: Insufficient documentation

## 2016-02-23 DIAGNOSIS — F4323 Adjustment disorder with mixed anxiety and depressed mood: Secondary | ICD-10-CM | POA: Diagnosis not present

## 2016-02-23 DIAGNOSIS — Z7984 Long term (current) use of oral hypoglycemic drugs: Secondary | ICD-10-CM | POA: Diagnosis not present

## 2016-02-23 DIAGNOSIS — G8929 Other chronic pain: Secondary | ICD-10-CM

## 2016-02-23 DIAGNOSIS — M545 Low back pain: Secondary | ICD-10-CM | POA: Diagnosis not present

## 2016-02-23 DIAGNOSIS — G4700 Insomnia, unspecified: Secondary | ICD-10-CM | POA: Insufficient documentation

## 2016-02-23 DIAGNOSIS — Z7982 Long term (current) use of aspirin: Secondary | ICD-10-CM | POA: Diagnosis not present

## 2016-02-23 DIAGNOSIS — H6981 Other specified disorders of Eustachian tube, right ear: Secondary | ICD-10-CM | POA: Diagnosis not present

## 2016-02-23 DIAGNOSIS — K121 Other forms of stomatitis: Secondary | ICD-10-CM | POA: Diagnosis not present

## 2016-02-23 DIAGNOSIS — Z96642 Presence of left artificial hip joint: Secondary | ICD-10-CM | POA: Insufficient documentation

## 2016-02-23 MED ORDER — OXYCODONE-ACETAMINOPHEN 5-325 MG PO TABS: 1.0000 | ORAL_TABLET | Freq: Four times a day (QID) | ORAL | Status: DC | PRN

## 2016-02-23 NOTE — Patient Instructions (Signed)
GENERAL RISKS AND COMPLICATIONS  What are the risk, side effects and possible complications? Generally speaking, most procedures are safe.  However, with any procedure there are risks, side effects, and the possibility of complications.  The risks and complications are dependent upon the sites that are lesioned, or the type of nerve block to be performed.  The closer the procedure is to the spine, the more serious the risks are.  Great care is taken when placing the radio frequency needles, block needles or lesioning probes, but sometimes complications can occur. 1. Infection: Any time there is an injection through the skin, there is a risk of infection.  This is why sterile conditions are used for these blocks.  There are four possible types of infection. 1. Localized skin infection. 2. Central Nervous System Infection-This can be in the form of Meningitis, which can be deadly. 3. Epidural Infections-This can be in the form of an epidural abscess, which can cause pressure inside of the spine, causing compression of the spinal cord with subsequent paralysis. This would require an emergency surgery to decompress, and there are no guarantees that the patient would recover from the paralysis. 4. Discitis-This is an infection of the intervertebral discs.  It occurs in about 1% of discography procedures.  It is difficult to treat and it may lead to surgery.        2. Pain: the needles have to go through skin and soft tissues, will cause soreness.       3. Damage to internal structures:  The nerves to be lesioned may be near blood vessels or    other nerves which can be potentially damaged.       4. Bleeding: Bleeding is more common if the patient is taking blood thinners such as  aspirin, Coumadin, Ticiid, Plavix, etc., or if he/she have some genetic predisposition  such as hemophilia. Bleeding into the spinal canal can cause compression of the spinal  cord with subsequent paralysis.  This would require an  emergency surgery to  decompress and there are no guarantees that the patient would recover from the  paralysis.       5. Pneumothorax:  Puncturing of a lung is a possibility, every time a needle is introduced in  the area of the chest or upper back.  Pneumothorax refers to free air around the  collapsed lung(s), inside of the thoracic cavity (chest cavity).  Another two possible  complications related to a similar event would include: Hemothorax and Chylothorax.   These are variations of the Pneumothorax, where instead of air around the collapsed  lung(s), you may have blood or chyle, respectively.       6. Spinal headaches: They may occur with any procedures in the area of the spine.       7. Persistent CSF (Cerebro-Spinal Fluid) leakage: This is a rare problem, but may occur  with prolonged intrathecal or epidural catheters either due to the formation of a fistulous  track or a dural tear.       8. Nerve damage: By working so close to the spinal cord, there is always a possibility of  nerve damage, which could be as serious as a permanent spinal cord injury with  paralysis.       9. Death:  Although rare, severe deadly allergic reactions known as "Anaphylactic  reaction" can occur to any of the medications used.      10. Worsening of the symptoms:  We can always make thing worse.    What are the chances of something like this happening? Chances of any of this occuring are extremely low.  By statistics, you have more of a chance of getting killed in a motor vehicle accident: while driving to the hospital than any of the above occurring .  Nevertheless, you should be aware that they are possibilities.  In general, it is similar to taking a shower.  Everybody knows that you can slip, hit your head and get killed.  Does that mean that you should not shower again?  Nevertheless always keep in mind that statistics do not mean anything if you happen to be on the wrong side of them.  Even if a procedure has a 1  (one) in a 1,000,000 (million) chance of going wrong, it you happen to be that one..Also, keep in mind that by statistics, you have more of a chance of having something go wrong when taking medications.  Who should not have this procedure? If you are on a blood thinning medication (e.g. Coumadin, Plavix, see list of "Blood Thinners"), or if you have an active infection going on, you should not have the procedure.  If you are taking any blood thinners, please inform your physician.  How should I prepare for this procedure?  Do not eat or drink anything at least six hours prior to the procedure.  Bring a driver with you .  It cannot be a taxi.  Come accompanied by an adult that can drive you back, and that is strong enough to help you if your legs get weak or numb from the local anesthetic.  Take all of your medicines the morning of the procedure with just enough water to swallow them.  If you have diabetes, make sure that you are scheduled to have your procedure done first thing in the morning, whenever possible.  If you have diabetes, take only half of your insulin dose and notify our nurse that you have done so as soon as you arrive at the clinic.  If you are diabetic, but only take blood sugar pills (oral hypoglycemic), then do not take them on the morning of your procedure.  You may take them after you have had the procedure.  Do not take aspirin or any aspirin-containing medications, at least eleven (11) days prior to the procedure.  They may prolong bleeding.  Wear loose fitting clothing that may be easy to take off and that you would not mind if it got stained with Betadine or blood.  Do not wear any jewelry or perfume  Remove any nail coloring.  It will interfere with some of our monitoring equipment.  NOTE: Remember that this is not meant to be interpreted as a complete list of all possible complications.  Unforeseen problems may occur.  BLOOD THINNERS The following drugs  contain aspirin or other products, which can cause increased bleeding during surgery and should not be taken for 2 weeks prior to and 1 week after surgery.  If you should need take something for relief of minor pain, you may take acetaminophen which is found in Tylenol,m Datril, Anacin-3 and Panadol. It is not blood thinner. The products listed below are.  Do not take any of the products listed below in addition to any listed on your instruction sheet.  A.P.C or A.P.C with Codeine Codeine Phosphate Capsules #3 Ibuprofen Ridaura  ABC compound Congesprin Imuran rimadil  Advil Cope Indocin Robaxisal  Alka-Seltzer Effervescent Pain Reliever and Antacid Coricidin or Coricidin-D  Indomethacin Rufen    Alka-Seltzer plus Cold Medicine Cosprin Ketoprofen S-A-C Tablets  Anacin Analgesic Tablets or Capsules Coumadin Korlgesic Salflex  Anacin Extra Strength Analgesic tablets or capsules CP-2 Tablets Lanoril Salicylate  Anaprox Cuprimine Capsules Levenox Salocol  Anexsia-D Dalteparin Magan Salsalate  Anodynos Darvon compound Magnesium Salicylate Sine-off  Ansaid Dasin Capsules Magsal Sodium Salicylate  Anturane Depen Capsules Marnal Soma  APF Arthritis pain formula Dewitt's Pills Measurin Stanback  Argesic Dia-Gesic Meclofenamic Sulfinpyrazone  Arthritis Bayer Timed Release Aspirin Diclofenac Meclomen Sulindac  Arthritis pain formula Anacin Dicumarol Medipren Supac  Analgesic (Safety coated) Arthralgen Diffunasal Mefanamic Suprofen  Arthritis Strength Bufferin Dihydrocodeine Mepro Compound Suprol  Arthropan liquid Dopirydamole Methcarbomol with Aspirin Synalgos  ASA tablets/Enseals Disalcid Micrainin Tagament  Ascriptin Doan's Midol Talwin  Ascriptin A/D Dolene Mobidin Tanderil  Ascriptin Extra Strength Dolobid Moblgesic Ticlid  Ascriptin with Codeine Doloprin or Doloprin with Codeine Momentum Tolectin  Asperbuf Duoprin Mono-gesic Trendar  Aspergum Duradyne Motrin or Motrin IB Triminicin  Aspirin  plain, buffered or enteric coated Durasal Myochrisine Trigesic  Aspirin Suppositories Easprin Nalfon Trillsate  Aspirin with Codeine Ecotrin Regular or Extra Strength Naprosyn Uracel  Atromid-S Efficin Naproxen Ursinus  Auranofin Capsules Elmiron Neocylate Vanquish  Axotal Emagrin Norgesic Verin  Azathioprine Empirin or Empirin with Codeine Normiflo Vitamin E  Azolid Emprazil Nuprin Voltaren  Bayer Aspirin plain, buffered or children's or timed BC Tablets or powders Encaprin Orgaran Warfarin Sodium  Buff-a-Comp Enoxaparin Orudis Zorpin  Buff-a-Comp with Codeine Equegesic Os-Cal-Gesic   Buffaprin Excedrin plain, buffered or Extra Strength Oxalid   Bufferin Arthritis Strength Feldene Oxphenbutazone   Bufferin plain or Extra Strength Feldene Capsules Oxycodone with Aspirin   Bufferin with Codeine Fenoprofen Fenoprofen Pabalate or Pabalate-SF   Buffets II Flogesic Panagesic   Buffinol plain or Extra Strength Florinal or Florinal with Codeine Panwarfarin   Buf-Tabs Flurbiprofen Penicillamine   Butalbital Compound Four-way cold tablets Penicillin   Butazolidin Fragmin Pepto-Bismol   Carbenicillin Geminisyn Percodan   Carna Arthritis Reliever Geopen Persantine   Carprofen Gold's salt Persistin   Chloramphenicol Goody's Phenylbutazone   Chloromycetin Haltrain Piroxlcam   Clmetidine heparin Plaquenil   Cllnoril Hyco-pap Ponstel   Clofibrate Hydroxy chloroquine Propoxyphen         Before stopping any of these medications, be sure to consult the physician who ordered them.  Some, such as Coumadin (Warfarin) are ordered to prevent or treat serious conditions such as "deep thrombosis", "pumonary embolisms", and other heart problems.  The amount of time that you may need off of the medication may also vary with the medication and the reason for which you were taking it.  If you are taking any of these medications, please make sure you notify your pain physician before you undergo any  procedures.         Knee Injection A knee injection is a procedure to get medicine into your knee joint. Your health care provider puts a needle into the joint and injects medicine with an attached syringe. The injected medicine may relieve the pain, swelling, and stiffness of arthritis. The injected medicine may also help to lubricate and cushion your knee joint. You may need more than one injection. LET [facility] CARE PROVIDER KNOW ABOUT: 2. Any allergies you have. 3. All medicines you are taking, including vitamins, herbs, eye drops, creams, and over-the-counter medicines. 4. Previous problems you or members of your family have had with the use of anesthetics. 5. Any blood disorders you have. 6. Previous surgeries you have had. 7. Any medical conditions you  may have. RISKS AND COMPLICATIONS Generally, this is a safe procedure. However, problems may occur, including:  Infection.  Bleeding.  Worsening symptoms.  Damage to the area around your knee.  Allergic reaction to any of the medicines.  Skin reactions from repeated injections. BEFORE THE PROCEDURE  Ask your health care provider about changing or stopping your regular medicines. This is especially important if you are taking diabetes medicines or blood thinners.  Plan to have someone take you home after the procedure. PROCEDURE  You will sit or lie down in a position for your knee to be treated.  The skin over your kneecap will be cleaned with a germ-killing solution (antiseptic).  You will be given a medicine that numbs the area (local anesthetic). You may feel some stinging.  After your knee becomes numb, you will have a second injection. This is the medicine. This needle is carefully placed between your kneecap and your knee. The medicine is injected into the joint space.  At the end of the procedure, the needle will be removed.  A bandage (dressing) may be placed over the injection site. The procedure may  vary among health care providers and hospitals. AFTER THE PROCEDURE 12. You may have to move your knee through its full range of motion. This helps to get all of the medicine into your joint space. 13. Your blood pressure, heart rate, breathing rate, and blood oxygen level will be monitored often until the medicines you were given have worn off. 14. You will be watched to make sure that you do not have a reaction to the injected medicine.   This information is not intended to replace advice given to you by your health care provider. Make sure you discuss any questions you have with your health care provider.   Document Released: 11/21/2006 Document Revised: 09/20/2014 Document Reviewed: 07/10/2014 Elsevier Interactive Patient Education [employer].

## 2016-02-23 NOTE — Progress Notes (Signed)
Safety precautions to be maintained throughout the outpatient stay will include: orient to surroundings, keep bed in low position, maintain call bell within reach at all times, provide assistance with transfer out of bed and ambulation. Percocet 5/325 mg pill count # 89.5/120  Filled 12-19-15

## 2016-02-23 NOTE — Progress Notes (Signed)
Patient's Name: Karen Lewis  Patient type: Established  MRN: [id number]  Service setting: Ambulatory outpatient  DOB: [birth year]  Location: [facility]  DOS: 02/23/2016  Primary Care Physician: Idelle Crouch, MD  Note by: Kathlen Brunswick. [name], M.D, DABA, [place], DABPM, DABIPP, [place]  Referring Physician: Idelle Crouch, MD  Specialty: Board-Certified Interventional Pain Management  Last Visit to Pain Management: 01/19/2016   Primary Reason(s) for Visit: Encounter for prescription drug management & post-procedure evaluation of chronic illness with mild to moderate exacerbation(Level of risk: moderate) CC: Knee Pain; Ankle Pain; and Back Pain   HPI  Karen Lewis is a 45 y.o. year old, female patient, who returns today as an established patient. She has History of asthma; Aggrieved; Binge eating disorder; Fibromyalgia; Anxiety, generalized; Insomnia, persistent; Depression, major, recurrent, moderate ([place]); Allergic state; Airway hyperreactivity; Chicken pox; Clinical depression; Dermatitis, eczematoid; Gout; BP (high blood pressure); HLD (hyperlipidemia); Headache, migraine; Combined fat and carbohydrate induced hyperlipemia; Multinodular goiter; Obstructive apnea; Bilateral polycystic ovarian syndrome; Apnea, sleep; Disease of thyroid gland; Disease with a predominantly sexual mode of transmission; Avitaminosis D; History of surgical procedure; Type 2 diabetes mellitus ([place]); Chronic pain; Long term current use of opiate analgesic; Long term prescription opiate use; Opiate use; Encounter for therapeutic drug level monitoring; Opiate dependence ([place]); Adjustment disorder with depressed mood; Essential (primary) hypertension; Cannot sleep; Moderate episode of recurrent major depressive disorder ([place]); Goiter, nontoxic, multinodular; Chronic knee pain (Bilateral) (Location of Primary Source of Pain); Osteoarthritis of knee (Bilateral); Chronic low back pain (R>L); Lumbar facet  syndrome (Bilateral) (R>L); Osteoarthrosis; Grade 1 (1.4 cm) Anterolisthesis of L4 over L5; Chronic hip pain (Bilateral) (Left total hip replacement); History of total left hip replacement; History of methicillin resistant staphylococcus aureus (MRSA); History of bariatric surgery; Hypomagnesemia; Morbid obesity with BMI of 70 and over, adult ([place]) (71.75 on 08/27/2015); Congenital spondylolisthesis; History of methicillin resistant Staphylococcus aureus infection; History of artificial joint; Lumbar spondylosis; Osteoarthritis of hips (Bilateral); Controlled drug dependence ([place]); Diabetes mellitus ([place]); Morbid obesity ([place]); Opioid dependence ([place]); Thyroid goiter; Vitamin D insufficiency; Goiter, non-toxic; Psychoactive substance dependence ([place]); Primary osteoarthritis of both hips; and Lumbar and sacral osteoarthritis on her problem list.. Her primarily concern today is the Knee Pain; Ankle Pain; and Back Pain   Pain Assessment: Self-Reported Pain Score: 6  Reported level is compatible with observation Pain Type: Chronic pain Pain Location: Knee (left ankle ) Pain Orientation: Right Pain Descriptors / Indicators: Other (Comment) (patient states "all of it") Pain Frequency: Constant  The patient comes into the clinics today for post-procedure evaluation on the interventional treatment done on 01/12/2016. In addition, she comes in today for pharmacological management of her chronic pain.  The patient  reports that she does not use illicit drugs.  Date of Last Visit: 11/25/15 Service Provided on Last Visit: Procedure (Bilateral Hyalagan injection)  Controlled Substance Pharmacotherapy Assessment & REMS (Risk Evaluation and Mitigation Strategy)  Analgesic: Oxycodone/APAP 5/325 one every 6 hours when necessary for pain (20 mg/day) Pill Count: Percocet 5/325 mg pill count # 89.5/120. Filled 12-19-15. Took some cough medicine with codeine since the other one did not work.  MME/day: 30 mg/day.    Pharmacokinetics: Onset of action (Liberation/Absorption): Within expected pharmacological parameters Time to Peak effect (Distribution): Timing and results are as within normal expected parameters Duration of action (Metabolism/Excretion): Within normal limits for medication Pharmacodynamics: Analgesic Effect: More than 50% Activity Facilitation: Medication(s) allow patient to sit, stand, walk, and do the basic ADLs Perceived  Effectiveness: Described as relatively effective, allowing for increase in activities of daily living (ADL) Side-effects or Adverse reactions: None reported Monitoring: [place] PMP: Online review of the past 88-month period conducted. Compliant with practice rules and regulations Last UDS on record: TOXASSURE SELECT 13  Date Value Ref Range Status  10/02/2015 FINAL  Final    Comment:    ==================================================================== TOXASSURE SELECT 13 (MW) ==================================================================== Test                             Result       Flag       Units Drug Present and Declared for Prescription Verification   Butalbital                     PRESENT      EXPECTED Drug Absent but Declared for Prescription Verification   Amphetamine                    Not Detected UNEXPECTED ng/mg creat   Clonazepam                     Not Detected UNEXPECTED ng/mg creat   Oxycodone                      Not Detected UNEXPECTED ng/mg creat ==================================================================== Test                      Result    Flag   Units      Ref Range   Creatinine              200              mg/dL      >=20 ==================================================================== Declared Medications:  The flagging and interpretation on this report are based on the  following declared medications.  Unexpected results may arise from  inaccuracies in the declared medications.  **Note: The testing scope of this  panel includes these medications:  Amphetamine (Vyvanse)  Butalbital  Butalbital (Fioricet)  Butalbital (Fiorinal)  Clonazepam  Clonazepam (Klonopin)  Oxycodone  **Note: The testing scope of this panel does not include following  reported medications:  Acetaminophen (Fioricet)  Amitriptyline  Aspirin (Fiorinal)  Gabapentin  Gabapentin (Neurontin)  Quetiapine (Seroquel)  Sertraline (Zoloft)  Topiramate (Topamax)  Zolpidem (Ambien) ==================================================================== For clinical consultation, please call [phone]. ====================================================================    UDS interpretation: Compliant Medication Assessment Form: Reviewed. Patient indicates being compliant with therapy Treatment compliance: Compliant Risk Assessment: Aberrant Behavior: None observed today Substance Use Disorder (SUD) Risk Level: High Risk of opioid abuse or dependence: 0.7-3.0% with doses ? 36 MME/day and 6.1-26% with doses ? 120 MME/day. Opioid Risk Tool (ORT) Score: Total Score: 4 Moderate Risk for SUD (Score between 4-7) Depression Scale Score: PHQ-2: PHQ-2 Total Score: 0 No depression (0) PHQ-9: PHQ-9 Total Score: 0 No depression (0-4)  Pharmacologic Plan: No change in therapy, at this time  Post-Procedure Assessment  Procedure done on last visit: Bilateral intra-articular Hyalgan knee injection #2 under fluoroscopic guidance, no sedation. Side-effects or Adverse reactions: None reported Sedation: No sedation used  Results: Ultra-Short Term Relief (First 1 hour after procedure): 0 %  Analgesia during this period is likely to be Local Anesthetic and/or IV Sedative (Analgesic/Anxiolitic) related Short Term Relief (Initial 4-6 hrs after procedure): 0 % Complete relief confirms area to be the source of pain Long  Term Relief : 60 % (couple of months) Long-term benefit would suggest an inflammatory etiology to the pain   Current Relief  (Now): 60%  Recurrance of pain could suggest persistent aggravating factors Interpretation of Results: The results of this procedure would suggest the injections to serve as a palliative way of treating her knee joint pain. However it is temporary as she needs to lose the weight in order to minimize the pressure on the knee joint.  Laboratory Chemistry  Inflammation Markers Lab Results  Component Value Date   ESRSEDRATE 36* 11/24/2015   CRP <0.5 11/24/2015    Renal Function Lab Results  Component Value Date   BUN 8 11/24/2015   CREATININE 0.76 11/24/2015   GFRAA >60 11/24/2015   GFRNONAA >60 11/24/2015    Hepatic Function Lab Results  Component Value Date   AST 23 11/24/2015   ALT 20 11/24/2015   ALBUMIN 3.7 11/24/2015    Electrolytes Lab Results  Component Value Date   NA 139 11/24/2015   K 4.3 11/24/2015   CL 105 11/24/2015   CALCIUM 8.6* 11/24/2015   MG 1.6* 11/24/2015    Pain Modulating Vitamins Lab Results  Component Value Date   VD25OH 27.4* 11/24/2015   VD125OH2TOT 41.5 11/24/2015   VITAMINB12 303 11/24/2015    Coagulation Parameters Lab Results  Component Value Date   INR 0.95 05/07/2015   LABPROT 12.9 05/07/2015   APTT 26 05/07/2015   PLT 337 11/12/2015    Note: Labs Reviewed.  Recent Diagnostic Imaging  Mm Digital Screening Bilateral  02/17/2016  CLINICAL DATA:  Screening. EXAM: DIGITAL SCREENING BILATERAL MAMMOGRAM WITH CAD COMPARISON:  Previous exam(s). ACR Breast Density Category a: The breast tissue is almost entirely fatty. FINDINGS: There are no findings suspicious for malignancy. Images were processed with CAD. IMPRESSION: No mammographic evidence of malignancy. A result letter of this screening mammogram will be mailed directly to the patient. RECOMMENDATION: Screening mammogram in one year. (Code:[id number]-B-01Y) BI-RADS CATEGORY  1: Negative. Electronically Signed   By: Lajean Manes M.D.   On: 02/17/2016 08:34    Meds  The patient has a  current medication list which includes the following prescription(s): acyclovir, albuterol, amitriptyline, atorvastatin, butalbital-acetaminophen-caffeine, calcium-vitamin d, vitamin d3, clonazepam, epinephrine, fluconazole, fluticasone-salmeterol, gabapentin, gabapentin, ipratropium-albuterol, ketoconazole, levocetirizine, levothyroxine, lisdexamfetamine, magnesium oxide, meclizine, medroxyprogesterone, metformin, metoprolol, montelukast, multivitamin with minerals, nystatin, omeprazole, oxycodone-acetaminophen, oxycodone-acetaminophen, oxycodone-acetaminophen, quetiapine, sertraline, spiriva handihaler, spironolactone, tizanidine, triamcinolone, true metrix blood glucose test, vitamin d (ergocalciferol), zolpidem, zonisamide, zonisamide, and levothyroxine.  Current Outpatient Prescriptions on File Prior to Visit  Medication Sig  . acyclovir (ZOVIRAX) 800 MG tablet Take 800 mg by mouth 2 (two) times daily. Reported on 11/12/2015  . albuterol (PROVENTIL HFA;VENTOLIN HFA) 108 (90 BASE) MCG/ACT inhaler Inhale 2 puffs into the lungs every 6 (six) hours as needed for wheezing or shortness of breath.  Marland Kitchen amitriptyline (ELAVIL) 100 MG tablet Take 100 mg by mouth at bedtime.   Marland Kitchen atorvastatin (LIPITOR) 40 MG tablet Take 40 mg by mouth daily at 6 PM.   . butalbital-acetaminophen-caffeine (FIORICET, ESGIC) 50-325-40 MG tablet 1 tablet every 6 (six) hours as needed.   . calcium-vitamin D (OSCAL WITH D) 500-200 MG-UNIT tablet Take 1 tablet by mouth 3 (three) times daily.  . Cholecalciferol (VITAMIN D3) 2000 units capsule Take 1 capsule (2,000 Units total) by mouth daily.  . clonazePAM (KLONOPIN) 0.5 MG tablet Take 1 tablet (0.5 mg total) by mouth 2 (two) times daily as needed for anxiety.  Marland Kitchen EPINEPHrine 0.3  mg/0.3 mL IJ SOAJ injection   . fluconazole (DIFLUCAN) 150 MG tablet   . Fluticasone-Salmeterol (ADVAIR DISKUS) 500-50 MCG/DOSE AEPB Inhale 1 puff into the lungs 2 (two) times daily.   Marland Kitchen gabapentin (NEURONTIN)  600 MG tablet Take 1 tablet (600 mg total) by mouth every 6 (six) hours. (Patient taking differently: Take 1,200 mg by mouth 2 (two) times daily. )  . gabapentin (NEURONTIN) 800 MG tablet Take 1 tablet (800 mg total) by mouth 6 (six) times daily. (Patient taking differently: Take 2,400 mg by mouth 2 (two) times daily. )  . ipratropium-albuterol (DUONEB) 0.5-2.5 (3) MG/3ML SOLN   . ketoconazole (NIZORAL) 2 % cream APPLY AS DIRECTED TO AFFECTED AREA TWICE DAILY AS NEEDED  . levocetirizine (XYZAL) 5 MG tablet   . levothyroxine (SYNTHROID, LEVOTHROID) 125 MCG tablet Take 1 tablet (125 mcg total) by mouth daily before breakfast.  . lisdexamfetamine (VYVANSE) 70 MG capsule Take 1 capsule (70 mg total) by mouth every morning.  . magnesium oxide (MAG-OX) 400 MG tablet Take 1 tablet (400 mg total) by mouth 2 (two) times daily.  . meclizine (ANTIVERT) 25 MG tablet Take 1 tablet (25 mg total) by mouth 3 (three) times daily as needed for dizziness.  . medroxyPROGESTERone (DEPO-PROVERA) 150 MG/ML injection   . metFORMIN (GLUCOPHAGE) 1000 MG tablet Take 1,000 mg by mouth 2 (two) times daily with a meal.   . metoprolol (LOPRESSOR) 50 MG tablet Take 75 mg by mouth 2 (two) times daily.   . montelukast (SINGULAIR) 10 MG tablet Take 10 mg by mouth at bedtime.   . Multiple Vitamin (MULTIVITAMIN WITH MINERALS) TABS tablet Take 2 tablets by mouth daily. Reported on 11/12/2015  . nystatin (MYCOSTATIN) 100000 UNIT/ML suspension   . omeprazole (PRILOSEC) 40 MG capsule Take 40 mg by mouth 2 (two) times daily.   . QUEtiapine (SEROQUEL) 100 MG tablet Take 1 tablet (100 mg total) by mouth at bedtime.  . sertraline (ZOLOFT) 100 MG tablet Take 2 tablets (200 mg total) by mouth daily.  Marland Kitchen SPIRIVA HANDIHALER 18 MCG inhalation capsule Reported on 11/12/2015  . spironolactone (ALDACTONE) 25 MG tablet Take 37.5 mg by mouth 2 (two) times daily.   Marland Kitchen tiZANidine (ZANAFLEX) 4 MG tablet 4 mg at bedtime.   . triamcinolone (NASACORT) 55  MCG/ACT AERO nasal inhaler 2 sprays by Nasal route 2 (two) times daily.  . TRUE METRIX BLOOD GLUCOSE TEST test strip   . Vitamin D, Ergocalciferol, (DRISDOL) 50000 units CAPS capsule Take 1 capsule (50,000 Units total) by mouth 2 (two) times a week. X 6 weeks.  Marland Kitchen zolpidem (AMBIEN) 10 MG tablet Take 1 tablet (10 mg total) by mouth at bedtime as needed for sleep.  Marland Kitchen zonisamide (ZONEGRAN) 100 MG capsule Take 100 mg by mouth daily.   Marland Kitchen zonisamide (ZONEGRAN) 50 MG capsule Take 50 mg by mouth daily.   Marland Kitchen levothyroxine (SYNTHROID, LEVOTHROID) 150 MCG tablet Reported on 02/23/2016   No current facility-administered medications on file prior to visit.    ROS  Constitutional: Denies any fever or chills Gastrointestinal: No reported hemesis, hematochezia, vomiting, or acute GI distress Musculoskeletal: Denies any acute onset joint swelling, redness, loss of ROM, or weakness Neurological: No reported episodes of acute onset apraxia, aphasia, dysarthria, agnosia, amnesia, paralysis, loss of coordination, or loss of consciousness  Allergies  Karen Lewis is allergic to xolair; bactrim; aspirin; ciprofloxacin; clindamycin; motrin; sulfa antibiotics; and nsaids.  [place]  Medical:  Karen Lewis  has a past medical history of Anginal  pain ([place]); Hypertension; Dysrhythmia; Asthma; Sleep apnea; Thyroid nodule (bilateral); Diabetes mellitus without complication ([place]); Depression; Anxiety; GERD (gastroesophageal reflux disease); History of hiatal hernia; Headache; Neuromuscular disorder ([place]); Fibromyalgia; Arthritis; Anemia; Obesity; H/O cardiovascular disorder (03/10/2015); H/O thyroid disease (03/10/2015); H/O surgical procedure (12/05/2012); Arthralgia of hip (07/29/2015); Arthritis, degenerative (07/29/2015); Gonalgia (07/29/2015); Cephalalgia (07/25/2014); History of artificial joint (07/29/2015); LBP (low back pain) (07/29/2015); Primary osteoarthritis of both knees (07/29/2015); Dependence on unknown drug ([place]);  Hypomagnesemia; Gout; Herpes; PCOS (polycystic ovarian syndrome); Eczema; and Hypothyroidism. Family: family history includes Alcohol abuse in her father and mother; Anxiety disorder in her father and mother; Breast cancer in her paternal aunt; COPD in her father; Depression in her brother, father, and mother; Diabetes in her brother, father, and mother; Hypertension in her brother, father, and mother; Kidney cancer in her mother; Kidney failure in her father; Post-traumatic stress disorder in her father; Sleep apnea in her brother, father, and mother. Surgical:  has past surgical history that includes Laparoscopic partial gastrectomy; Shoulder arthroscopy (Right); Joint replacement (Left, hip); Carpal tunnel release (Bilateral); Diagnostic laparoscopy; Cholecystectomy; Trigger finger release (Right); and Thyroidectomy (N/A, 11/12/2015). Tobacco:  reports that she has never smoked. She has never used smokeless tobacco. Alcohol:  reports that she does not drink alcohol. Drug:  reports that she does not use illicit drugs.  Constitutional Exam  Vitals: Blood pressure 162/82, pulse 103, temperature 98.5 F (36.9 C), resp. rate 22, height 5\' 4"  (1.626 m), weight 430 lb (195.047 kg), SpO2 100 %. General appearance: Well nourished, well developed, and well hydrated. In no acute distress Calculated BMI/Body habitus: Body mass index is 73.77 kg/(m^2). (>40 kg/m2) Extreme obesity (Class III) - 254% higher incidence of chronic pain Psych/Mental status: Alert and oriented x 3 (person, place, & time) Eyes: PERLA Respiratory: No evidence of acute respiratory distress  Cervical Spine Exam  Inspection: No masses, redness, or swelling Alignment: Symmetrical ROM: Functional: ROM is within functional limits [facility]) Stability: No instability detected Muscle strength & Tone: Functionally intact Sensory: Unimpaired Palpation: No complaints of tenderness  Upper Extremity (UE) Exam    Side: Right upper extremity   Side: Left upper extremity  Inspection: No masses, redness, swelling, or asymmetry  Inspection: No masses, redness, swelling, or asymmetry  ROM:  ROM:  Functional: ROM is within functional limits [facility] [place])  Functional: ROM is within functional limits [facility])  Muscle strength & Tone: Functionally intact  Muscle strength & Tone: Functionally intact  Sensory: Unimpaired  Sensory: Unimpaired  Palpation: Non-contributory  Palpation: Non-contributory   Thoracic Spine Exam  Inspection: No masses, redness, or swelling Alignment: Symmetrical ROM: Functional: ROM is within functional limits [facility]) Stability: No instability detected Sensory: Unimpaired Muscle strength & Tone: Functionally intact Palpation: No complaints of tenderness  Lumbar Spine Exam  Inspection: No masses, redness, or swelling Alignment: Symmetrical ROM: Functional: ROM is within functional limits [facility]) Stability: No instability detected Muscle strength & Tone: Functionally intact Sensory: Unimpaired Palpation: No complaints of tenderness Provocative Tests: Lumbar Hyperextension and rotation test: deferred Patrick's Maneuver: deferred  Gait & Posture Assessment  Ambulation: Patient ambulates using a cane Gait: Modified gait pattern (slower gait speed, wider stride width, and longer stance duration) associated with morbid obesity Posture: WNL  Lower Extremity Exam    Side: Right lower extremity  Side: Left lower extremity  Inspection: No masses, redness, swelling, or asymmetry ROM:  Inspection: No masses, redness, swelling, or asymmetry ROM:  Functional: ROM is within functional limits [facility])  Functional: ROM is within functional limits (  WFL)  Muscle strength & Tone: Functionally intact  Muscle strength & Tone: Functionally intact  Sensory: Unimpaired  Sensory: Unimpaired  Palpation: Non-contributory  Palpation: Non-contributory   Assessment & Plan  Primary Diagnosis & Pertinent Problem List: The primary encounter  diagnosis was Chronic pain. Diagnoses of Encounter for therapeutic drug level monitoring, Long term current use of opiate analgesic, Chronic knee pain (Bilateral) (Location of Primary Source of Pain), Primary osteoarthritis of both knees, Fibromyalgia, Chronic low back pain (R>L), and Lumbar facet syndrome (Bilateral) (R>L) were also pertinent to this visit.  Visit Diagnosis: 1. Chronic pain   2. Encounter for therapeutic drug level monitoring   3. Long term current use of opiate analgesic   4. Chronic knee pain (Bilateral) (Location of Primary Source of Pain)   5. Primary osteoarthritis of both knees   6. Fibromyalgia   7. Chronic low back pain (R>L)   8. Lumbar facet syndrome (Bilateral) (R>L)     Problems updated and reviewed during this visit: Problem  History of Artificial Joint  Goiter, Non-Toxic  Psychoactive Substance Dependence ([facility])  Primary Osteoarthritis of Both Hips  Lumbar and Sacral Osteoarthritis  Airway Hyperreactivity    Problem-specific Plan(s): No problem-specific assessment & plan notes found for this encounter.  No new assessment & plan notes have been filed under this hospital service since the last note was generated. Service: Pain Management   Plan of Care   Problem List Items Addressed This Visit      High   Chronic knee pain (Bilateral) (Location of Primary Source of Pain) (Chronic)   Relevant Medications   oxyCODONE-acetaminophen (PERCOCET/ROXICET) 5-325 MG tablet   oxyCODONE-acetaminophen (PERCOCET/ROXICET) 5-325 MG tablet   oxyCODONE-acetaminophen (PERCOCET/ROXICET) 5-325 MG tablet   Other Relevant Orders   KNEE INJECTION   Chronic low back pain (R>L) (Chronic)   Relevant Medications   oxyCODONE-acetaminophen (PERCOCET/ROXICET) 5-325 MG tablet   oxyCODONE-acetaminophen (PERCOCET/ROXICET) 5-325 MG tablet   oxyCODONE-acetaminophen (PERCOCET/ROXICET) 5-325 MG tablet   Chronic pain - Primary (Chronic)   Relevant Medications    oxyCODONE-acetaminophen (PERCOCET/ROXICET) 5-325 MG tablet   oxyCODONE-acetaminophen (PERCOCET/ROXICET) 5-325 MG tablet   oxyCODONE-acetaminophen (PERCOCET/ROXICET) 5-325 MG tablet   Fibromyalgia (Chronic)   Lumbar facet syndrome (Bilateral) (R>L) (Chronic)   Relevant Medications   oxyCODONE-acetaminophen (PERCOCET/ROXICET) 5-325 MG tablet   oxyCODONE-acetaminophen (PERCOCET/ROXICET) 5-325 MG tablet   oxyCODONE-acetaminophen (PERCOCET/ROXICET) 5-325 MG tablet   Osteoarthritis of knee (Bilateral) (Chronic)   Relevant Medications   oxyCODONE-acetaminophen (PERCOCET/ROXICET) 5-325 MG tablet   oxyCODONE-acetaminophen (PERCOCET/ROXICET) 5-325 MG tablet   oxyCODONE-acetaminophen (PERCOCET/ROXICET) 5-325 MG tablet   Other Relevant Orders   KNEE INJECTION     Medium   Encounter for therapeutic drug level monitoring   Long term current use of opiate analgesic (Chronic)   Relevant Orders   ToxASSURE Select 13 (MW), Urine       Pharmacotherapy (Medications Ordered): Meds ordered this encounter  Medications  . oxyCODONE-acetaminophen (PERCOCET/ROXICET) 5-325 MG tablet    Sig: Take 1 tablet by mouth every 6 (six) hours as needed for severe pain.    Dispense:  120 tablet    Refill:  0    Do not add this medication to the electronic "Automatic Refill" notification system. Patient may have prescription filled one day early if pharmacy is closed on scheduled refill date. Do not fill until: 03/15/16 To last until: 04/14/16  . oxyCODONE-acetaminophen (PERCOCET/ROXICET) 5-325 MG tablet    Sig: Take 1 tablet by mouth every 6 (six) hours as needed for severe pain.  Dispense:  120 tablet    Refill:  0    Do not add this medication to the electronic "Automatic Refill" notification system. Patient may have prescription filled one day early if pharmacy is closed on scheduled refill date. Do not fill until: 04/14/16 To last until: 05/14/16  . oxyCODONE-acetaminophen (PERCOCET/ROXICET) 5-325 MG  tablet    Sig: Take 1 tablet by mouth every 6 (six) hours as needed for severe pain.    Dispense:  120 tablet    Refill:  0    Do not add this medication to the electronic "Automatic Refill" notification system. Patient may have prescription filled one day early if pharmacy is closed on scheduled refill date. Do not fill until: 05/14/16 To last until: 06/13/16    [facility] & Procedure Ordered: Orders Placed This Encounter  Procedures  . KNEE INJECTION  . ToxASSURE Select 13 (MW), Urine    Imaging Ordered: None  Interventional Therapies: Scheduled:  Bilateral Hyalgan Knee injection #3 under fluorscopy. No IV sedation.   Considering:  To complete series of 5 Hyalgan knee injections under fluoroscopic guidance, no sedation.    PRN Procedures:   Intra-articular Hyalgan knee injections.  Diagnostic bilateral genicular nerve block under fluoroscopic guidance, with or without sedation.  Palliative bilateral lumbar facet block under fluoroscopic guidance and IV sedation.    Referral(s) or Consult(s): None at this time.  New Prescriptions   No medications on file    Medications administered during this visit: Karen Lewis had no medications administered during this visit.  Requested PM Follow-up: Return in about 3 months (around 05/10/2016) for Procedure (Scheduled), Medication Management, (3-Mo).  Future Appointments Date Time Provider [place]  02/24/2016 9:30 AM [id number]  04/19/2016 1:00 PM Himabindu Einar Grad, MD [facility] None  05/10/2016 10:40 AM Milinda Pointer, MD [facility] None    Primary Care Physician: Idelle Crouch, MD Location: [facility] Note by: Kathlen Brunswick. [name], M.D, DABA, DABAPM, DABPM, DABIPP, FIPP  Pain Score Disclaimer: We use the NRS-11 scale. This is a self-reported, subjective measurement of pain severity with only modest accuracy. It is used primarily to identify changes within a particular  patient. It must be understood that outpatient pain scales are significantly less accurate that those used for research, where they can be applied under ideal controlled circumstances with minimal exposure to variables. In reality, the score is likely to be a combination of pain intensity and pain affect, where pain affect describes the degree of emotional arousal or changes in action readiness caused by the sensory experience of pain. Factors such as social and work situation, setting, emotional state, anxiety levels, expectation, and prior pain experience may influence pain perception and show large inter-individual differences that may also be affected by time variables.  Patient instructions provided at this appointment:: Patient Instructions   GENERAL RISKS AND COMPLICATIONS  What are the risk, side effects and possible complications? Generally speaking, most procedures are safe.  However, with any procedure there are risks, side effects, and the possibility of complications.  The risks and complications are dependent upon the sites that are lesioned, or the type of nerve block to be performed.  The closer the procedure is to the spine, the more serious the risks are.  Great care is taken when placing the radio frequency needles, block needles or lesioning probes, but sometimes complications can occur. 1. Infection: Any time there is an injection through the skin, there is a risk of infection.  This is  why sterile conditions are used for these blocks.  There are four possible types of infection. 1. Localized skin infection. 2. Central Nervous System Infection-This can be in the form of Meningitis, which can be deadly. 3. Epidural Infections-This can be in the form of an epidural abscess, which can cause pressure inside of the spine, causing compression of the spinal cord with subsequent paralysis. This would require an emergency surgery to decompress, and there are no guarantees that the patient  would recover from the paralysis. 4. Discitis-This is an infection of the intervertebral discs.  It occurs in about 1% of discography procedures.  It is difficult to treat and it may lead to surgery.        2. Pain: the needles have to go through skin and soft tissues, will cause soreness.       3. Damage to internal structures:  The nerves to be lesioned may be near blood vessels or    other nerves which can be potentially damaged.       4. Bleeding: Bleeding is more common if the patient is taking blood thinners such as  aspirin, Coumadin, Ticiid, Plavix, etc., or if he/she have some genetic predisposition  such as hemophilia. Bleeding into the spinal canal can cause compression of the spinal  cord with subsequent paralysis.  This would require an emergency surgery to  decompress and there are no guarantees that the patient would recover from the  paralysis.       5. Pneumothorax:  Puncturing of a lung is a possibility, every time a needle is introduced in  the area of the chest or upper back.  Pneumothorax refers to free air around the  collapsed lung(s), inside of the thoracic cavity (chest cavity).  Another two possible  complications related to a similar event would include: Hemothorax and Chylothorax.   These are variations of the Pneumothorax, where instead of air around the collapsed  lung(s), you may have blood or chyle, respectively.       6. Spinal headaches: They may occur with any procedures in the area of the spine.       7. Persistent CSF (Cerebro-Spinal Fluid) leakage: This is a rare problem, but may occur  with prolonged intrathecal or epidural catheters either due to the formation of a fistulous  track or a dural tear.       8. Nerve damage: By working so close to the spinal cord, there is always a possibility of  nerve damage, which could be as serious as a permanent spinal cord injury with  paralysis.       9. Death:  Although rare, severe deadly allergic reactions known as  "Anaphylactic  reaction" can occur to any of the medications used.      10. Worsening of the symptoms:  We can always make thing worse.  What are the chances of something like this happening? Chances of any of this occuring are extremely low.  By statistics, you have more of a chance of getting killed in a motor vehicle accident: while driving to the hospital than any of the above occurring .  Nevertheless, you should be aware that they are possibilities.  In general, it is similar to taking a shower.  Everybody knows that you can slip, hit your head and get killed.  Does that mean that you should not shower again?  Nevertheless always keep in mind that statistics do not mean anything if you happen to be on the wrong side  of them.  Even if a procedure has a 1 (one) in a 1,000,000 (million) chance of going wrong, it you happen to be that one..Also, keep in mind that by statistics, you have more of a chance of having something go wrong when taking medications.  Who should not have this procedure? If you are on a blood thinning medication (e.g. Coumadin, Plavix, see list of "Blood Thinners"), or if you have an active infection going on, you should not have the procedure.  If you are taking any blood thinners, please inform your physician.  How should I prepare for this procedure?  Do not eat or drink anything at least six hours prior to the procedure.  Bring a driver with you .  It cannot be a taxi.  Come accompanied by an adult that can drive you back, and that is strong enough to help you if your legs get weak or numb from the local anesthetic.  Take all of your medicines the morning of the procedure with just enough water to swallow them.  If you have diabetes, make sure that you are scheduled to have your procedure done first thing in the morning, whenever possible.  If you have diabetes, take only half of your insulin dose and notify our nurse that you have done so as soon as you arrive at the  clinic.  If you are diabetic, but only take blood sugar pills (oral hypoglycemic), then do not take them on the morning of your procedure.  You may take them after you have had the procedure.  Do not take aspirin or any aspirin-containing medications, at least eleven (11) days prior to the procedure.  They may prolong bleeding.  Wear loose fitting clothing that may be easy to take off and that you would not mind if it got stained with Betadine or blood.  Do not wear any jewelry or perfume  Remove any nail coloring.  It will interfere with some of our monitoring equipment.  NOTE: Remember that this is not meant to be interpreted as a complete list of all possible complications.  Unforeseen problems may occur.  BLOOD THINNERS The following drugs contain aspirin or other products, which can cause increased bleeding during surgery and should not be taken for 2 weeks prior to and 1 week after surgery.  If you should need take something for relief of minor pain, you may take acetaminophen which is found in Tylenol,m Datril, Anacin-3 and Panadol. It is not blood thinner. The products listed below are.  Do not take any of the products listed below in addition to any listed on your instruction sheet.  A.P.C or A.P.C with Codeine Codeine Phosphate Capsules #3 Ibuprofen Ridaura  ABC compound Congesprin Imuran rimadil  Advil Cope Indocin Robaxisal  Alka-Seltzer Effervescent Pain Reliever and Antacid Coricidin or Coricidin-D  Indomethacin Rufen  Alka-Seltzer plus Cold Medicine Cosprin Ketoprofen S-A-C Tablets  Anacin Analgesic Tablets or Capsules Coumadin Korlgesic Salflex  Anacin Extra Strength Analgesic tablets or capsules CP-2 Tablets Lanoril Salicylate  Anaprox Cuprimine Capsules Levenox Salocol  Anexsia-D Dalteparin Magan Salsalate  Anodynos Darvon compound Magnesium Salicylate Sine-off  Ansaid Dasin Capsules Magsal Sodium Salicylate  Anturane Depen Capsules Marnal Soma  APF Arthritis pain  formula Dewitt's Pills Measurin Stanback  Argesic Dia-Gesic Meclofenamic Sulfinpyrazone  Arthritis Bayer Timed Release Aspirin Diclofenac Meclomen Sulindac  Arthritis pain formula Anacin Dicumarol Medipren Supac  Analgesic (Safety coated) Arthralgen Diffunasal Mefanamic Suprofen  Arthritis Strength Bufferin Dihydrocodeine Mepro Compound Suprol  Arthropan liquid Dopirydamole  Methcarbomol with Aspirin Synalgos  ASA tablets/Enseals Disalcid Micrainin Tagament  Ascriptin Doan's Midol Talwin  Ascriptin A/D Dolene Mobidin Tanderil  Ascriptin Extra Strength Dolobid Moblgesic Ticlid  Ascriptin with Codeine Doloprin or Doloprin with Codeine Momentum Tolectin  Asperbuf Duoprin Mono-gesic Trendar  Aspergum Duradyne Motrin or Motrin IB Triminicin  Aspirin plain, buffered or enteric coated Durasal Myochrisine Trigesic  Aspirin Suppositories Easprin Nalfon Trillsate  Aspirin with Codeine Ecotrin Regular or Extra Strength Naprosyn Uracel  Atromid-S Efficin Naproxen Ursinus  Auranofin Capsules Elmiron Neocylate Vanquish  Axotal Emagrin Norgesic Verin  Azathioprine Empirin or Empirin with Codeine Normiflo Vitamin E  Azolid Emprazil Nuprin Voltaren  Bayer Aspirin plain, buffered or children's or timed BC Tablets or powders Encaprin Orgaran Warfarin Sodium  Buff-a-Comp Enoxaparin Orudis Zorpin  Buff-a-Comp with Codeine Equegesic Os-Cal-Gesic   Buffaprin Excedrin plain, buffered or Extra Strength Oxalid   Bufferin Arthritis Strength Feldene Oxphenbutazone   Bufferin plain or Extra Strength Feldene Capsules Oxycodone with Aspirin   Bufferin with Codeine Fenoprofen Fenoprofen Pabalate or Pabalate-SF   Buffets II Flogesic Panagesic   Buffinol plain or Extra Strength Florinal or Florinal with Codeine Panwarfarin   Buf-Tabs Flurbiprofen Penicillamine   Butalbital Compound Four-way cold tablets Penicillin   Butazolidin Fragmin Pepto-Bismol   Carbenicillin Geminisyn Percodan   Carna Arthritis Reliever Geopen  Persantine   Carprofen Gold's salt Persistin   Chloramphenicol Goody's Phenylbutazone   Chloromycetin Haltrain Piroxlcam   Clmetidine heparin Plaquenil   Cllnoril Hyco-pap Ponstel   Clofibrate Hydroxy chloroquine Propoxyphen         Before stopping any of these medications, be sure to consult the physician who ordered them.  Some, such as Coumadin (Warfarin) are ordered to prevent or treat serious conditions such as "deep thrombosis", "pumonary embolisms", and other heart problems.  The amount of time that you may need off of the medication may also vary with the medication and the reason for which you were taking it.  If you are taking any of these medications, please make sure you notify your pain physician before you undergo any procedures.         Knee Injection A knee injection is a procedure to get medicine into your knee joint. Your health care provider puts a needle into the joint and injects medicine with an attached syringe. The injected medicine may relieve the pain, swelling, and stiffness of arthritis. The injected medicine may also help to lubricate and cushion your knee joint. You may need more than one injection. LET [facility] CARE PROVIDER KNOW ABOUT: 2. Any allergies you have. 3. All medicines you are taking, including vitamins, herbs, eye drops, creams, and over-the-counter medicines. 4. Previous problems you or members of your family have had with the use of anesthetics. 5. Any blood disorders you have. 6. Previous surgeries you have had. 7. Any medical conditions you may have. RISKS AND COMPLICATIONS Generally, this is a safe procedure. However, problems may occur, including:  Infection.  Bleeding.  Worsening symptoms.  Damage to the area around your knee.  Allergic reaction to any of the medicines.  Skin reactions from repeated injections. BEFORE THE PROCEDURE  Ask your health care provider about changing or stopping your regular medicines. This is  especially important if you are taking diabetes medicines or blood thinners.  Plan to have someone take you home after the procedure. PROCEDURE  You will sit or lie down in a position for your knee to be treated.  The skin over your kneecap will be cleaned  with a germ-killing solution (antiseptic).  You will be given a medicine that numbs the area (local anesthetic). You may feel some stinging.  After your knee becomes numb, you will have a second injection. This is the medicine. This needle is carefully placed between your kneecap and your knee. The medicine is injected into the joint space.  At the end of the procedure, the needle will be removed.  A bandage (dressing) may be placed over the injection site. The procedure may vary among health care providers and hospitals. AFTER THE PROCEDURE 12. You may have to move your knee through its full range of motion. This helps to get all of the medicine into your joint space. 13. Your blood pressure, heart rate, breathing rate, and blood oxygen level will be monitored often until the medicines you were given have worn off. 14. You will be watched to make sure that you do not have a reaction to the injected medicine.   This information is not intended to replace advice given to you by your health care provider. Make sure you discuss any questions you have with your health care provider.   Document Released: 11/21/2006 Document Revised: 09/20/2014 Document Reviewed: 07/10/2014 Elsevier Interactive Patient Education [employer].

## 2016-02-23 NOTE — Progress Notes (Signed)
Note Gabapentin dosages on med list.

## 2016-02-24 ENCOUNTER — Ambulatory Visit: Payer: Commercial Managed Care - HMO

## 2016-03-01 ENCOUNTER — Ambulatory Visit
Admission: RE | Admit: 2016-03-01 | Discharge: 2016-03-01 | Disposition: A | Discharge status: Home / Self Care | Payer: Commercial Managed Care - HMO | Source: Ambulatory Visit | Attending: Nurse Practitioner | Admitting: Nurse Practitioner

## 2016-03-01 DIAGNOSIS — R1310 Dysphagia, unspecified: Secondary | ICD-10-CM | POA: Diagnosis not present

## 2016-03-01 DIAGNOSIS — E559 Vitamin D deficiency, unspecified: Secondary | ICD-10-CM | POA: Diagnosis not present

## 2016-03-01 DIAGNOSIS — E119 Type 2 diabetes mellitus without complications: Secondary | ICD-10-CM | POA: Diagnosis not present

## 2016-03-01 DIAGNOSIS — K224 Dyskinesia of esophagus: Secondary | ICD-10-CM | POA: Diagnosis not present

## 2016-03-01 DIAGNOSIS — E890 Postprocedural hypothyroidism: Secondary | ICD-10-CM | POA: Diagnosis not present

## 2016-03-01 DIAGNOSIS — E782 Mixed hyperlipidemia: Secondary | ICD-10-CM | POA: Diagnosis not present

## 2016-03-01 DIAGNOSIS — K219 Gastro-esophageal reflux disease without esophagitis: Secondary | ICD-10-CM | POA: Diagnosis not present

## 2016-03-04 LAB — TOXASSURE SELECT 13 (MW), URINE: PDF: 0

## 2016-03-04 NOTE — Progress Notes (Signed)
Quick Note:  NOTE: This forensic urine drug screen (UDS) test was conducted using a state-of-the-art ultra high performance liquid chromatography and mass spectrometry system (UPLC/MS-MS), the most sophisticated and accurate method available. UPLC/MS-MS is 1,000 times more precise and accurate than standard gas chromatography and mass spectrometry (GC/MS). This system can analyze 26 drug categories and 180 drug compounds.  Evidence of ETOH found on UDS. This is always concerning, especially when alcohol and opioid metabolites are both present at the same time. This can lead to respiratory depression and death. The issue will be discussed with the patient at length. Possible sources include: consumption of alcoholic beverages, or use of medications or products containing alcohol, such as NyQuil, or some cough medicines (antitussives). ______

## 2016-03-12 DIAGNOSIS — H9203 Otalgia, bilateral: Secondary | ICD-10-CM | POA: Diagnosis not present

## 2016-03-12 DIAGNOSIS — M26622 Arthralgia of left temporomandibular joint: Secondary | ICD-10-CM | POA: Diagnosis not present

## 2016-04-06 DIAGNOSIS — J329 Chronic sinusitis, unspecified: Secondary | ICD-10-CM | POA: Diagnosis not present

## 2016-04-06 DIAGNOSIS — N390 Urinary tract infection, site not specified: Secondary | ICD-10-CM | POA: Diagnosis not present

## 2016-04-06 DIAGNOSIS — H9201 Otalgia, right ear: Secondary | ICD-10-CM | POA: Diagnosis not present

## 2016-04-16 ENCOUNTER — Other Ambulatory Visit (HOSPITAL_COMMUNITY): Payer: Self-pay | Admitting: Otolaryngology

## 2016-04-16 DIAGNOSIS — J321 Chronic frontal sinusitis: Secondary | ICD-10-CM

## 2016-04-16 DIAGNOSIS — J019 Acute sinusitis, unspecified: Secondary | ICD-10-CM | POA: Diagnosis not present

## 2016-04-16 DIAGNOSIS — J3089 Other allergic rhinitis: Secondary | ICD-10-CM | POA: Diagnosis not present

## 2016-04-16 DIAGNOSIS — J370 Chronic laryngitis: Secondary | ICD-10-CM | POA: Diagnosis not present

## 2016-04-16 DIAGNOSIS — J3081 Allergic rhinitis due to animal (cat) (dog) hair and dander: Secondary | ICD-10-CM | POA: Diagnosis not present

## 2016-04-16 DIAGNOSIS — J301 Allergic rhinitis due to pollen: Secondary | ICD-10-CM | POA: Diagnosis not present

## 2016-04-19 ENCOUNTER — Ambulatory Visit: Payer: Commercial Managed Care - HMO | Admitting: Psychiatry

## 2016-04-20 ENCOUNTER — Ambulatory Visit (HOSPITAL_COMMUNITY): Payer: Commercial Managed Care - HMO

## 2016-05-04 ENCOUNTER — Ambulatory Visit (INDEPENDENT_AMBULATORY_CARE_PROVIDER_SITE_OTHER): Payer: Commercial Managed Care - HMO | Admitting: Psychiatry

## 2016-05-04 ENCOUNTER — Encounter: Payer: Self-pay | Admitting: Psychiatry

## 2016-05-04 VITALS — BP 131/88 | HR 103 | Temp 99.6°F | Ht 64.0 in | Wt >= 6400 oz

## 2016-05-04 DIAGNOSIS — J3089 Other allergic rhinitis: Secondary | ICD-10-CM | POA: Diagnosis not present

## 2016-05-04 DIAGNOSIS — E559 Vitamin D deficiency, unspecified: Secondary | ICD-10-CM | POA: Diagnosis not present

## 2016-05-04 DIAGNOSIS — J301 Allergic rhinitis due to pollen: Secondary | ICD-10-CM | POA: Diagnosis not present

## 2016-05-04 DIAGNOSIS — F411 Generalized anxiety disorder: Secondary | ICD-10-CM | POA: Diagnosis not present

## 2016-05-04 DIAGNOSIS — R1310 Dysphagia, unspecified: Secondary | ICD-10-CM | POA: Insufficient documentation

## 2016-05-04 DIAGNOSIS — J3081 Allergic rhinitis due to animal (cat) (dog) hair and dander: Secondary | ICD-10-CM | POA: Diagnosis not present

## 2016-05-04 DIAGNOSIS — F5081 Binge eating disorder: Secondary | ICD-10-CM

## 2016-05-04 DIAGNOSIS — F331 Major depressive disorder, recurrent, moderate: Secondary | ICD-10-CM

## 2016-05-04 DIAGNOSIS — E890 Postprocedural hypothyroidism: Secondary | ICD-10-CM | POA: Diagnosis not present

## 2016-05-04 DIAGNOSIS — K219 Gastro-esophageal reflux disease without esophagitis: Secondary | ICD-10-CM | POA: Insufficient documentation

## 2016-05-04 DIAGNOSIS — E119 Type 2 diabetes mellitus without complications: Secondary | ICD-10-CM | POA: Diagnosis not present

## 2016-05-04 DIAGNOSIS — E282 Polycystic ovarian syndrome: Secondary | ICD-10-CM | POA: Diagnosis not present

## 2016-05-04 MED ORDER — QUETIAPINE FUMARATE 100 MG PO TABS: 100.0000 mg | ORAL_TABLET | Freq: Every day | ORAL | 0 refills | Status: DC

## 2016-05-04 MED ORDER — LISDEXAMFETAMINE DIMESYLATE 70 MG PO CAPS: 70.0000 mg | ORAL_CAPSULE | ORAL | 0 refills | Status: DC

## 2016-05-04 MED ORDER — SERTRALINE HCL 100 MG PO TABS: 200.0000 mg | ORAL_TABLET | Freq: Every day | ORAL | 2 refills | Status: DC

## 2016-05-04 NOTE — Progress Notes (Signed)
Patient ID: Karen Lewis, female   DOB: Aug 13, 1971, 45 y.o.   MRN: [id number]  [facility] MD/PA/NP OP Progress Note  05/04/2016 1:29 PM Karen Lewis  MRN:  [id number]  Subjective:  Patient is a 45 yo female with MDD and binge eating disorder. Patient returns for a follow-up appointment today. Patient reports that her mood continues to do well. States she does have some trouble sleeping. States that she is able to sleep for 3-4 hours after which she wakes up. Patient is taking amitriptyline for headaches and it does seem to help initially with sleep. States that overall she seems to doing well but continues to binge on an almost daily basis. States that her triggers her anxiety and her lack of control over certain situations. She is currently in a relationship and states that it is not what she would like it to be and that distresses her. She realizes that she has to work on herself before she tries to control her external environment. Denies any suicidal thoughts. She is been compliant with all her medications.  Chief Complaint: bingeing daily Chief Complaint    Follow-up; Medication Refill     Visit Diagnosis:     ICD-9-CM ICD-10-CM   1. Major depressive disorder, recurrent episode, moderate (HCC) 296.32 F33.1   2. GAD (generalized anxiety disorder) 300.02 F41.1   3. Binge eating disorder 307.50 F50.81     Past Medical History:  Past Medical History:  Diagnosis Date  . Anemia   . Anginal pain ([place])   . Anxiety   . Arthralgia of hip 07/29/2015  . Arthritis   . Arthritis, degenerative 07/29/2015  . Asthma   . Cephalalgia 07/25/2014  . Dependence on unknown drug ([place])    multiplt controlled drug dependence  . Depression   . Diabetes mellitus without complication ([place])   . Dysrhythmia   . Eczema   . Fibromyalgia   . GERD (gastroesophageal reflux disease)   . Gonalgia 07/29/2015   Overview:  Overview:  The patient has had bilateral intra-articular Hyalgan injections done on 07/16/2014 and  although she seems to do well with this type of therapy, apparently her insurance company does not want to pay for they Hyalgan. On 11/27/2014 the patient underwent a bilateral genicular nerve block with excellent results. On 01/28/2015 she had a right knee genicular radiofrequency ablatio  . Gout   . H/O cardiovascular disorder 03/10/2015  . H/O surgical procedure 12/05/2012   Overview:  LSG ([facility] - April 2013)    . H/O thyroid disease 03/10/2015  . Headache   . Herpes   . History of artificial joint 07/29/2015  . History of hiatal hernia   . Hypertension   . Hypomagnesemia   . Hypothyroidism   . LBP (low back pain) 07/29/2015  . Neuromuscular disorder ([place])   . Obesity   . PCOS (polycystic ovarian syndrome)   . Primary osteoarthritis of both knees 07/29/2015  . Sleep apnea   . Thyroid nodule bilateral    Past Surgical History:  Procedure Laterality Date  . CARPAL TUNNEL RELEASE Bilateral   . CHOLECYSTECTOMY    . DIAGNOSTIC LAPAROSCOPY    . JOINT REPLACEMENT Left hip  . LAPAROSCOPIC PARTIAL GASTRECTOMY    . SHOULDER ARTHROSCOPY Right   . THYROIDECTOMY N/A 11/12/2015   Procedure: THYROIDECTOMY;  Surgeon: Clyde Canterbury, MD;  Location: [facility] ORS;  Service: ENT;  Laterality: N/A;  . TRIGGER FINGER RELEASE Right    Family History:  Family History  Problem Relation  Age of Onset  . Anxiety disorder Mother   . Depression Mother   . Alcohol abuse Mother   . Diabetes Mother   . Hypertension Mother   . Kidney cancer Mother   . Sleep apnea Mother   . Alcohol abuse Father   . Anxiety disorder Father   . Depression Father   . Post-traumatic stress disorder Father   . Kidney failure Father   . COPD Father   . Diabetes Father   . Hypertension Father   . Sleep apnea Father   . Depression Brother   . Diabetes Brother   . Hypertension Brother   . Sleep apnea Brother   . Breast cancer Paternal Aunt    Social History:  Social History   Social History  . Marital status: Single     Spouse name: N/A  . Number of children: N/A  . Years of education: N/A   Social History Main Topics  . Smoking status: Never Smoker  . Smokeless tobacco: Never Used  . Alcohol use No  . Drug use: No  . Sexual activity: Not Currently   Other Topics Concern  . Not on file   Social History Narrative  . No narrative on file   Additional History:   Assessment:   Musculoskeletal: Strength & Muscle Tone: within normal limits Gait & Station: normal Patient leans: N/A  Psychiatric Specialty Exam: HPI  Review of Systems  Psychiatric/Behavioral: Negative for depression, hallucinations, memory loss, substance abuse and suicidal ideas. The patient is not nervous/anxious and does not have insomnia.   All other systems reviewed and are negative.   There were no vitals taken for this visit.There is no height or weight on file to calculate BMI.  General Appearance: Well Groomed  Eye Contact:  Good  Speech:  Normal Rate  Volume:  Normal  Mood:  good  Affect: normal  Thought Process:  Linear and Logical  Orientation:  Full (Time, Place, and Person)  Thought Content:  Negative  Suicidal Thoughts:  No  Homicidal Thoughts:  No  Memory:  Immediate;   Good Recent;   Good Remote;   Good  Judgement:  Good  Insight:  Good  Psychomotor Activity:  Negative  Concentration:  Good  Recall:  Good  Fund of Knowledge: Good  Language: Good  Akathisia:  Negative  Handed:  Right  AIMS (if indicated):   Assets:  Communication Skills Desire for Improvement  ADL's:  Intact  Cognition: WNL  Sleep:  fair   Is the patient at risk to self?  No. Has the patient been a risk to self in the past 6 months?  No. Has the patient been a risk to self within the distant past?  No. Is the patient a risk to others?  No. Has the patient been a risk to others in the past 6 months?  No. Has the patient been a risk to others within the distant past?  No.  Current Medications: Current Outpatient  Prescriptions  Medication Sig Dispense Refill  . acyclovir (ZOVIRAX) 800 MG tablet Take 800 mg by mouth 2 (two) times daily. Reported on 11/12/2015    . albuterol (PROVENTIL HFA;VENTOLIN HFA) 108 (90 BASE) MCG/ACT inhaler Inhale 2 puffs into the lungs every 6 (six) hours as needed for wheezing or shortness of breath.    Marland Kitchen amitriptyline (ELAVIL) 100 MG tablet Take 100 mg by mouth at bedtime.     Marland Kitchen atorvastatin (LIPITOR) 40 MG tablet Take 40 mg by mouth daily  at 6 PM.     . butalbital-acetaminophen-caffeine (FIORICET, ESGIC) 50-325-40 MG tablet 1 tablet every 6 (six) hours as needed.     . calcium-vitamin D (OSCAL WITH D) 500-200 MG-UNIT tablet Take 1 tablet by mouth 3 (three) times daily. 60 tablet 0  . Cholecalciferol (VITAMIN D3) 2000 units capsule Take 1 capsule (2,000 Units total) by mouth daily. 30 capsule PRN  . clonazePAM (KLONOPIN) 0.5 MG tablet Take 1 tablet (0.5 mg total) by mouth 2 (two) times daily as needed for anxiety. 60 tablet 2  . EPINEPHrine 0.3 mg/0.3 mL IJ SOAJ injection     . fluconazole (DIFLUCAN) 150 MG tablet     . Fluticasone-Salmeterol (ADVAIR DISKUS) 500-50 MCG/DOSE AEPB Inhale 1 puff into the lungs 2 (two) times daily.     Marland Kitchen gabapentin (NEURONTIN) 600 MG tablet Take 1 tablet (600 mg total) by mouth every 6 (six) hours. (Patient taking differently: Take 1,200 mg by mouth 2 (two) times daily. ) 360 tablet 0  . gabapentin (NEURONTIN) 800 MG tablet Take 1 tablet (800 mg total) by mouth 6 (six) times daily. (Patient taking differently: Take 2,400 mg by mouth 2 (two) times daily. ) 540 tablet 0  . ipratropium-albuterol (DUONEB) 0.5-2.5 (3) MG/3ML SOLN     . ketoconazole (NIZORAL) 2 % cream APPLY AS DIRECTED TO AFFECTED AREA TWICE DAILY AS NEEDED    . levocetirizine (XYZAL) 5 MG tablet     . levothyroxine (SYNTHROID, LEVOTHROID) 125 MCG tablet Take 1 tablet (125 mcg total) by mouth daily before breakfast. 30 tablet 3  . levothyroxine (SYNTHROID, LEVOTHROID) 150 MCG tablet  Reported on 02/23/2016    . lisdexamfetamine (VYVANSE) 70 MG capsule Take 1 capsule (70 mg total) by mouth every morning. 30 capsule 0  . lisdexamfetamine (VYVANSE) 70 MG capsule Take 1 capsule (70 mg total) by mouth every morning. 30 capsule 0  . magnesium oxide (MAG-OX) 400 MG tablet Take 1 tablet (400 mg total) by mouth 2 (two) times daily. 60 tablet 2  . meclizine (ANTIVERT) 25 MG tablet Take 1 tablet (25 mg total) by mouth 3 (three) times daily as needed for dizziness. 30 tablet 0  . medroxyPROGESTERone (DEPO-PROVERA) 150 MG/ML injection     . metFORMIN (GLUCOPHAGE) 1000 MG tablet Take 1,000 mg by mouth 2 (two) times daily with a meal.     . metoprolol (LOPRESSOR) 50 MG tablet Take 75 mg by mouth 2 (two) times daily.     . montelukast (SINGULAIR) 10 MG tablet Take 10 mg by mouth at bedtime.     . Multiple Vitamin (MULTIVITAMIN WITH MINERALS) TABS tablet Take 2 tablets by mouth daily. Reported on 11/12/2015    . nystatin (MYCOSTATIN) 100000 UNIT/ML suspension     . omeprazole (PRILOSEC) 40 MG capsule Take 40 mg by mouth 2 (two) times daily.     Marland Kitchen oxyCODONE-acetaminophen (PERCOCET/ROXICET) 5-325 MG tablet Take 1 tablet by mouth every 6 (six) hours as needed for severe pain. 120 tablet 0  . oxyCODONE-acetaminophen (PERCOCET/ROXICET) 5-325 MG tablet Take 1 tablet by mouth every 6 (six) hours as needed for severe pain. 120 tablet 0  . oxyCODONE-acetaminophen (PERCOCET/ROXICET) 5-325 MG tablet Take 1 tablet by mouth every 6 (six) hours as needed for severe pain. 120 tablet 0  . QUEtiapine (SEROQUEL) 100 MG tablet Take 1 tablet (100 mg total) by mouth at bedtime. 90 tablet 0  . sertraline (ZOLOFT) 100 MG tablet Take 2 tablets (200 mg total) by mouth daily. 180 tablet 2  .  SPIRIVA HANDIHALER 18 MCG inhalation capsule Reported on 11/12/2015    . spironolactone (ALDACTONE) 25 MG tablet Take 37.5 mg by mouth 2 (two) times daily.     Marland Kitchen tiZANidine (ZANAFLEX) 4 MG tablet 4 mg at bedtime.     . triamcinolone  (NASACORT) 55 MCG/ACT AERO nasal inhaler 2 sprays by Nasal route 2 (two) times daily.    . TRUE METRIX BLOOD GLUCOSE TEST test strip     . Vitamin D, Ergocalciferol, (DRISDOL) 50000 units CAPS capsule Take 1 capsule (50,000 Units total) by mouth 2 (two) times a week. X 6 weeks. 12 capsule 0  . zolpidem (AMBIEN) 10 MG tablet Take 1 tablet (10 mg total) by mouth at bedtime as needed for sleep. 30 tablet 2  . zonisamide (ZONEGRAN) 100 MG capsule Take 100 mg by mouth daily.     Marland Kitchen zonisamide (ZONEGRAN) 50 MG capsule Take 50 mg by mouth daily.      No current facility-administered medications for this visit.     Medical Decision Making:  Established Problem, Worsening (2), Review of Medication Regimen & Side Effects (2) and Review of New Medication or Change in Dosage (2)  Treatment Plan Summary:Medication management and Plan   Major depressive disorder Continue Sertraline at 200 mg daily.  Continue on the Seroquel 100 mg at bedtime.   Anxiety Same as above  Binge Eating Disorder-Continue  Vyvanse 70 mg.  Continue to see therapist regularly and work on reducing her binging, patient will start to see a counselor with trauma experience to help her address her previous trauma. Return to clinic in 2 months time or call before if necessary      Japheth Diekman 05/04/2016, 1:29 PM

## 2016-05-05 DIAGNOSIS — Z01812 Encounter for preprocedural laboratory examination: Secondary | ICD-10-CM | POA: Diagnosis not present

## 2016-05-05 DIAGNOSIS — Z5181 Encounter for therapeutic drug level monitoring: Secondary | ICD-10-CM | POA: Diagnosis not present

## 2016-05-05 DIAGNOSIS — R638 Other symptoms and signs concerning food and fluid intake: Secondary | ICD-10-CM | POA: Diagnosis not present

## 2016-05-05 DIAGNOSIS — I10 Essential (primary) hypertension: Secondary | ICD-10-CM | POA: Diagnosis not present

## 2016-05-05 DIAGNOSIS — K219 Gastro-esophageal reflux disease without esophagitis: Secondary | ICD-10-CM | POA: Diagnosis not present

## 2016-05-05 DIAGNOSIS — R5381 Other malaise: Secondary | ICD-10-CM | POA: Diagnosis not present

## 2016-05-05 DIAGNOSIS — M797 Fibromyalgia: Secondary | ICD-10-CM | POA: Diagnosis not present

## 2016-05-05 DIAGNOSIS — R5383 Other fatigue: Secondary | ICD-10-CM | POA: Diagnosis not present

## 2016-05-05 DIAGNOSIS — E119 Type 2 diabetes mellitus without complications: Secondary | ICD-10-CM | POA: Diagnosis not present

## 2016-05-05 DIAGNOSIS — G4733 Obstructive sleep apnea (adult) (pediatric): Secondary | ICD-10-CM | POA: Diagnosis not present

## 2016-05-05 DIAGNOSIS — E7800 Pure hypercholesterolemia, unspecified: Secondary | ICD-10-CM | POA: Diagnosis not present

## 2016-05-10 ENCOUNTER — Ambulatory Visit: Payer: Commercial Managed Care - HMO | Attending: Pain Medicine | Admitting: Pain Medicine

## 2016-05-10 ENCOUNTER — Encounter: Payer: Self-pay | Admitting: Pain Medicine

## 2016-05-10 VITALS — BP 111/52 | HR 92 | Temp 98.4°F | Resp 16 | Ht 64.0 in | Wt >= 6400 oz

## 2016-05-10 DIAGNOSIS — Z96642 Presence of left artificial hip joint: Secondary | ICD-10-CM | POA: Insufficient documentation

## 2016-05-10 DIAGNOSIS — K219 Gastro-esophageal reflux disease without esophagitis: Secondary | ICD-10-CM | POA: Insufficient documentation

## 2016-05-10 DIAGNOSIS — E119 Type 2 diabetes mellitus without complications: Secondary | ICD-10-CM | POA: Diagnosis not present

## 2016-05-10 DIAGNOSIS — Z6841 Body Mass Index (BMI) 40.0 and over, adult: Secondary | ICD-10-CM | POA: Diagnosis not present

## 2016-05-10 DIAGNOSIS — G4733 Obstructive sleep apnea (adult) (pediatric): Secondary | ICD-10-CM | POA: Insufficient documentation

## 2016-05-10 DIAGNOSIS — F411 Generalized anxiety disorder: Secondary | ICD-10-CM | POA: Insufficient documentation

## 2016-05-10 DIAGNOSIS — E079 Disorder of thyroid, unspecified: Secondary | ICD-10-CM | POA: Insufficient documentation

## 2016-05-10 DIAGNOSIS — L308 Other specified dermatitis: Secondary | ICD-10-CM | POA: Insufficient documentation

## 2016-05-10 DIAGNOSIS — M109 Gout, unspecified: Secondary | ICD-10-CM | POA: Diagnosis not present

## 2016-05-10 DIAGNOSIS — F5081 Binge eating disorder: Secondary | ICD-10-CM | POA: Insufficient documentation

## 2016-05-10 DIAGNOSIS — M797 Fibromyalgia: Secondary | ICD-10-CM

## 2016-05-10 DIAGNOSIS — E559 Vitamin D deficiency, unspecified: Secondary | ICD-10-CM | POA: Insufficient documentation

## 2016-05-10 DIAGNOSIS — E042 Nontoxic multinodular goiter: Secondary | ICD-10-CM | POA: Insufficient documentation

## 2016-05-10 DIAGNOSIS — M25562 Pain in left knee: Secondary | ICD-10-CM | POA: Diagnosis not present

## 2016-05-10 DIAGNOSIS — M170 Bilateral primary osteoarthritis of knee: Secondary | ICD-10-CM

## 2016-05-10 DIAGNOSIS — Z9884 Bariatric surgery status: Secondary | ICD-10-CM | POA: Insufficient documentation

## 2016-05-10 DIAGNOSIS — M25561 Pain in right knee: Secondary | ICD-10-CM | POA: Diagnosis not present

## 2016-05-10 DIAGNOSIS — E785 Hyperlipidemia, unspecified: Secondary | ICD-10-CM | POA: Diagnosis not present

## 2016-05-10 DIAGNOSIS — J45909 Unspecified asthma, uncomplicated: Secondary | ICD-10-CM | POA: Diagnosis not present

## 2016-05-10 DIAGNOSIS — G43909 Migraine, unspecified, not intractable, without status migrainosus: Secondary | ICD-10-CM | POA: Insufficient documentation

## 2016-05-10 DIAGNOSIS — F338 Other recurrent depressive disorders: Secondary | ICD-10-CM | POA: Insufficient documentation

## 2016-05-10 DIAGNOSIS — E282 Polycystic ovarian syndrome: Secondary | ICD-10-CM | POA: Insufficient documentation

## 2016-05-10 DIAGNOSIS — M1611 Unilateral primary osteoarthritis, right hip: Secondary | ICD-10-CM

## 2016-05-10 DIAGNOSIS — Z7984 Long term (current) use of oral hypoglycemic drugs: Secondary | ICD-10-CM | POA: Insufficient documentation

## 2016-05-10 DIAGNOSIS — G8929 Other chronic pain: Secondary | ICD-10-CM

## 2016-05-10 DIAGNOSIS — I10 Essential (primary) hypertension: Secondary | ICD-10-CM | POA: Insufficient documentation

## 2016-05-10 DIAGNOSIS — Z79891 Long term (current) use of opiate analgesic: Secondary | ICD-10-CM

## 2016-05-10 DIAGNOSIS — Z8614 Personal history of Methicillin resistant Staphylococcus aureus infection: Secondary | ICD-10-CM | POA: Insufficient documentation

## 2016-05-10 DIAGNOSIS — F1190 Opioid use, unspecified, uncomplicated: Secondary | ICD-10-CM

## 2016-05-10 DIAGNOSIS — G4700 Insomnia, unspecified: Secondary | ICD-10-CM | POA: Insufficient documentation

## 2016-05-10 DIAGNOSIS — M4316 Spondylolisthesis, lumbar region: Secondary | ICD-10-CM | POA: Insufficient documentation

## 2016-05-10 DIAGNOSIS — M25571 Pain in right ankle and joints of right foot: Secondary | ICD-10-CM | POA: Diagnosis present

## 2016-05-10 DIAGNOSIS — M25572 Pain in left ankle and joints of left foot: Secondary | ICD-10-CM | POA: Diagnosis present

## 2016-05-10 MED ORDER — MAGNESIUM OXIDE 400 MG PO TABS: 400.0000 mg | ORAL_TABLET | Freq: Two times a day (BID) | ORAL | 2 refills | Status: DC

## 2016-05-10 MED ORDER — OXYCODONE-ACETAMINOPHEN 5-325 MG PO TABS: 1.0000 | ORAL_TABLET | Freq: Four times a day (QID) | ORAL | 0 refills | Status: DC | PRN

## 2016-05-10 NOTE — Progress Notes (Signed)
Patient's Name: Karen Lewis  Patient type: Established  MRN: [id number]  Service setting: Ambulatory outpatient  DOB: 18-Oct-1970  Location: [facility]  DOS: 05/10/2016  Primary Care Physician: Karen Crouch, MD  Note by: Karen Lewis. [name], M.D  Referring Physician: Idelle Crouch, MD  Specialty: Interventional Pain Management  Last Visit to Pain Management: 02/23/2016   Primary Reason(s) for Visit: Encounter for prescription drug management (Level of risk: moderate) CC: Ankle Pain (left worse than right); Back Pain (mid to lower right back); and Hip Pain (right)   HPI  Karen Lewis is a 45 y.o. year old, female patient, who returns today as an established patient. She has History of asthma; Binge eating disorder; Fibromyalgia; Anxiety, generalized; Insomnia, persistent; Depression, major, recurrent, moderate ([place]); Airway hyperreactivity; Dermatitis, eczematoid; Gout; Hyperlipidemia; Headache, migraine; Obstructive apnea; Bilateral polycystic ovarian syndrome; Apnea, sleep; Disease of thyroid gland; Avitaminosis D; History of surgical procedure; Type 2 diabetes mellitus ([place]); Chronic pain; Long term current use of opiate analgesic; Long term prescription opiate use; Opiate use; Encounter for therapeutic drug level monitoring; Opiate dependence ([place]); Essential (primary) hypertension; Goiter, nontoxic, multinodular; Chronic knee pain (Bilateral) (Location of Primary Source of Pain); Osteoarthritis of knee (Bilateral); Chronic low back pain (R>L); Lumbar facet syndrome (Bilateral) (R>L); Osteoarthrosis; Grade 1 (1.4 cm) Anterolisthesis of L4 over L5; Chronic hip pain (Bilateral) (Left total hip replacement); History of total left hip replacement; History of methicillin resistant staphylococcus aureus (MRSA); History of bariatric surgery; Hypomagnesemia; Morbid obesity with BMI of 70 and over, adult ([place]) (71.75 on 08/27/2015); History of methicillin resistant Staphylococcus aureus  infection; History of artificial joint; Lumbar spondylosis; Vitamin D insufficiency; Dysphagia; Gastroesophageal reflux disease without esophagitis; and Osteoarthritis of hip (Right) on her problem list.. Her primarily concern today is the Ankle Pain (left worse than right); Back Pain (mid to lower right back); and Hip Pain (right)   Pain Assessment: Self-Reported Pain Score: 8  Clinically the patient looks like a 2/10 Reported level is inconsistent with clinical obrservations Information on the proper use of the pain score provided to the patient today. Pain Type: Chronic pain Pain Location: Ankle Pain Orientation: Left, Right Pain Descriptors / Indicators: Aching, Sharp (stiff; left more than the right ankle) Pain Frequency: Constant  The patient comes into the clinics today for pharmacological management of her chronic pain. I last saw this patient on 02/23/2016. The patient  reports that she does not use drugs. Her body mass index is 70.89 kg/m.  Date of Last Visit: 02/23/16 Service Provided on Last Visit: Med Refill  Controlled Substance Pharmacotherapy Assessment & REMS (Risk Evaluation and Mitigation Strategy)  Analgesic: Oxycodone/APAP 5/325 one every 6 hours when necessary for pain (20 mg/day) MME/day: 30 mg/day.  Pill Count: Pill count / Patient did not bring pill bottle; patient has rx left at home; will bring back to next appt. patient given a final warning with regards to bringing the pills to be counted. Pharmacokinetics: Onset of action (Liberation/Absorption): Within expected pharmacological parameters Time to Peak effect (Distribution): Timing and results are as within normal expected parameters Duration of action (Metabolism/Excretion): Within normal limits for medication Pharmacodynamics: Analgesic Effect: More than 50% Activity Facilitation: Medication(s) allow patient to sit, stand, walk, and do the basic ADLs Perceived Effectiveness: Described as relatively  effective, allowing for increase in activities of daily living (ADL) Side-effects or Adverse reactions: None reported Monitoring:  PMP: Online review of the past 39-month period conducted. Compliant with practice rules and regulations Last  UDS on record: ToxAssure Select 13  Date Value Ref Range Status  02/23/2016 FINAL  Final    Comment:    ==================================================================== TOXASSURE SELECT 13 (MW) ==================================================================== Test                             Result       Flag       Units Drug Present and Declared for Prescription Verification   Amphetamine                    >3676        EXPECTED   ng/mg creat    Amphetamine is available as a schedule II prescription drug.   Oxycodone                      401          EXPECTED   ng/mg creat   Oxymorphone                    31           EXPECTED   ng/mg creat   Noroxycodone                   2608         EXPECTED   ng/mg creat    Sources of oxycodone include scheduled prescription medications.    Oxymorphone and noroxycodone are expected metabolites of    oxycodone. Oxymorphone is also available as a scheduled    prescription medication.   Butalbital                     PRESENT      EXPECTED Drug Present not Declared for Prescription Verification   Alcohol, Ethyl                 0.026        UNEXPECTED g/dL    Sources of ethyl alcohol include alcoholic beverages or as a    fermentation product of glucose; glucose was not detected in this    specimen. Ethyl alcohol result should be interpreted in the    context of all available clinical and behavioral information. Drug Absent but Declared for Prescription Verification   Clonazepam                     Not Detected UNEXPECTED ng/mg creat ==================================================================== Test                      Result    Flag   Units      Ref Range   Creatinine              272               mg/dL      >=20 ==================================================================== Declared Medications:  The flagging and interpretation on this report are based on the  following declared medications.  Unexpected results may arise from  inaccuracies in the declared medications.  **Note: The testing scope of this panel includes these medications:  Amphetamine (Vyvanse)  Butalbital (Esgic)  Butalbital (Fioricet)  Clonazepam (Klonopin)  Oxycodone (Percocet)  Oxycodone (Roxicet)  **Note: The testing scope of this panel does not include following  reported medications:  Acetaminophen (Esgic)  Acetaminophen (Fioricet)  Acetaminophen (Percocet)  Acetaminophen (Roxicet)  Acyclovir  Albuterol  Albuterol (Duoneb)  Amitriptyline (Elavil)  Atorvastatin (Lipitor)  Epinephrine  Fluconazole (Diflucan)  Fluticasone (Advair)  Gabapentin  Ipratropium (Duoneb)  Ketoconazole  Levocetirizine (Xyzal)  Levothyroxine  Magnesium (Mag-Ox)  Meclizine  Medroxyprogesterone (Depo-Provera)  Metoprolol (Lopressor)  Montelukast (Singulair)  Multivitamin  Nystatin  Omeprazole  Quetiapine (Seroquel)  Salmeterol (Advair)  Sertraline (Zoloft)  Spironolactone (Aldactone)  Supplement (OsCal)  Tiotropium (Spiriva)  Tizanidine (Zanaflex)  Triamcinolone (Nasacort)  Vitamin D2 (Ergocalciferol)  Vitamin D3  Zolpidem (Ambien)  Zonisamide ==================================================================== For clinical consultation, please call [phone]. ====================================================================    UDS interpretation: Compliant Patient informed of the CDC guidelines and recommendations to stay away from the concomitant use of benzodiazepines and opioids due to the increased risk of respiratory depression and death. Medication Assessment Form: Reviewed. Patient indicates being compliant with therapy Treatment compliance: Compliant Risk Assessment: Aberrant  Behavior: None observed today Substance Use Disorder (SUD) Risk Level: Low-to-moderate Risk of opioid abuse or dependence: 0.7-3.0% with doses ? 36 MME/day and 6.1-26% with doses ? 120 MME/day. Opioid Risk Tool (ORT) Score: Total Score: 8 High Risk for Opioid Abuse (Score >8) Depression Scale Score: PHQ-2: PHQ-2 Total Score: 0 No depression (0) PHQ-9: PHQ-9 Total Score: 0 No depression (0-4)  Pharmacologic Plan: No change in therapy, at this time  Laboratory Chemistry  Inflammation Markers Lab Results  Component Value Date   ESRSEDRATE 36 (H) 11/24/2015   CRP <0.5 11/24/2015    Renal Function Lab Results  Component Value Date   BUN 8 11/24/2015   CREATININE 0.76 11/24/2015   GFRAA >60 11/24/2015   GFRNONAA >60 11/24/2015    Hepatic Function Lab Results  Component Value Date   AST 23 11/24/2015   ALT 20 11/24/2015   ALBUMIN 3.7 11/24/2015    Electrolytes Lab Results  Component Value Date   NA 139 11/24/2015   K 4.3 11/24/2015   CL 105 11/24/2015   CALCIUM 8.6 (L) 11/24/2015   MG 1.6 (L) 11/24/2015    Pain Modulating Vitamins Lab Results  Component Value Date   VD25OH 27.4 (L) 11/24/2015   VD125OH2TOT 41.5 11/24/2015   VITAMINB12 303 11/24/2015    Coagulation Parameters Lab Results  Component Value Date   INR 0.95 05/07/2015   LABPROT 12.9 05/07/2015   APTT 26 05/07/2015   PLT 337 11/12/2015    Cardiovascular Lab Results  Component Value Date   HGB 12.4 11/12/2015   HCT 38.0 11/12/2015    Note: Lab results reviewed.  Recent Diagnostic Imaging  Dg Esophagus  Result Date: 03/01/2016 CLINICAL DATA:  History of reflux, esophagitis, increased symptoms over the past 18 months ; occasion the difficult use swallowing pills; history of gastric sleeve procedure in 2013 and thyroidectomy in March of 2017. EXAM: ESOPHOGRAM/BARIUM SWALLOW TECHNIQUE: Single contrast examination was performed using  thin barium. FLUOROSCOPY TIME:  Fluoroscopy Time:  0 minutes,  48 seconds Number of Acquired Images:  8 plus 3 video loops COMPARISON:  None. FINDINGS: The patient ingested the thin barium without difficulty. The cervical esophagus is appear normal. There was no laryngeal penetration of the barium. The barium tablet also passed without difficulty. In the supine position a few tertiary contractions were observed. No significant hiatal hernia was observed on today's study. Within the limits of the study no reflux was observed. Post surgical changes of a gastric sleeve were demonstrated. IMPRESSION: Mild tertiary contractions in the supine position. No significant hiatal hernia no evidence of reflux. No evidence of stricture nor esophagitis. Electronically Signed   By: David  Martinique M.D.   On:  03/01/2016 12:36    Meds  The patient has a current medication list which includes the following prescription(s): acyclovir, albuterol, amitriptyline, azelastine, butalbital-acetaminophen-caffeine, epinephrine, fluticasone-salmeterol, gabapentin, gabapentin, glipizide, ipratropium-albuterol, ketoconazole, levocetirizine, levothyroxine, lisdexamfetamine, magnesium oxide, meclizine, medroxyprogesterone, meloxicam, metformin, metoprolol, montelukast, multivitamin with minerals, nystatin, omeprazole, oxycodone-acetaminophen, oxycodone-acetaminophen, oxycodone-acetaminophen, prednisone, quetiapine, ranitidine, relion pen needle 31g/61mm, rosuvastatin, sertraline, spironolactone, sucralfate, tizanidine, triamcinolone, triamcinolone, true metrix blood glucose test, vitamin d (ergocalciferol), zolpidem, zonisamide, and zonisamide.  Current Outpatient Prescriptions on File Prior to Visit  Medication Sig  . acyclovir (ZOVIRAX) 800 MG tablet Take 800 mg by mouth 2 (two) times daily. Reported on 11/12/2015  . albuterol (PROVENTIL HFA;VENTOLIN HFA) 108 (90 BASE) MCG/ACT inhaler Inhale 2 puffs into the lungs every 6 (six) hours as needed for wheezing or shortness of breath.  Marland Kitchen amitriptyline  (ELAVIL) 100 MG tablet Take 100 mg by mouth at bedtime.   . butalbital-acetaminophen-caffeine (FIORICET, ESGIC) 50-325-40 MG tablet 1 tablet every 6 (six) hours as needed.   Marland Kitchen EPINEPHrine 0.3 mg/0.3 mL IJ SOAJ injection as needed.   . Fluticasone-Salmeterol (ADVAIR DISKUS) 500-50 MCG/DOSE AEPB Inhale 1 puff into the lungs 2 (two) times daily.   Marland Kitchen gabapentin (NEURONTIN) 600 MG tablet Take 1 tablet (600 mg total) by mouth every 6 (six) hours. (Patient taking differently: Take 1,200 mg by mouth 2 (two) times daily. )  . ipratropium-albuterol (DUONEB) 0.5-2.5 (3) MG/3ML SOLN as needed.   Marland Kitchen ketoconazole (NIZORAL) 2 % cream APPLY AS DIRECTED TO AFFECTED AREA TWICE DAILY AS NEEDED  . lisdexamfetamine (VYVANSE) 70 MG capsule Take 1 capsule (70 mg total) by mouth every morning.  . meclizine (ANTIVERT) 25 MG tablet Take 1 tablet (25 mg total) by mouth 3 (three) times daily as needed for dizziness.  . medroxyPROGESTERone (DEPO-PROVERA) 150 MG/ML injection Inject 150 mg into the muscle every 3 (three) months.   . metFORMIN (GLUCOPHAGE) 1000 MG tablet Take 1,000 mg by mouth 2 (two) times daily with a meal.   . metoprolol (LOPRESSOR) 50 MG tablet Take 75 mg by mouth 2 (two) times daily.   . montelukast (SINGULAIR) 10 MG tablet Take 10 mg by mouth at bedtime.   . Multiple Vitamin (MULTIVITAMIN WITH MINERALS) TABS tablet Take 2 tablets by mouth daily. Reported on 11/12/2015  . nystatin (MYCOSTATIN) 100000 UNIT/ML suspension Take 5 mLs by mouth 4 (four) times daily.   Marland Kitchen omeprazole (PRILOSEC) 40 MG capsule Take 40 mg by mouth 2 (two) times daily.   . QUEtiapine (SEROQUEL) 100 MG tablet Take 1 tablet (100 mg total) by mouth at bedtime.  . sertraline (ZOLOFT) 100 MG tablet Take 2 tablets (200 mg total) by mouth daily.  Marland Kitchen spironolactone (ALDACTONE) 25 MG tablet Take 37.5 mg by mouth 2 (two) times daily.   Marland Kitchen tiZANidine (ZANAFLEX) 4 MG tablet 4 mg at bedtime.   . triamcinolone (NASACORT) 55 MCG/ACT AERO nasal inhaler 2  sprays by Nasal route 2 (two) times daily.  . TRUE METRIX BLOOD GLUCOSE TEST test strip   . Vitamin D, Ergocalciferol, (DRISDOL) 50000 units CAPS capsule Take 1 capsule (50,000 Units total) by mouth 2 (two) times a week. X 6 weeks.  Marland Kitchen zolpidem (AMBIEN) 10 MG tablet Take 1 tablet (10 mg total) by mouth at bedtime as needed for sleep.  Marland Kitchen zonisamide (ZONEGRAN) 100 MG capsule Take 100 mg by mouth daily.   Marland Kitchen zonisamide (ZONEGRAN) 50 MG capsule Take 50 mg by mouth daily.    No current facility-administered medications on file prior to visit.  ROS  Constitutional: Denies any fever or chills Gastrointestinal: No reported hemesis, hematochezia, vomiting, or acute GI distress Musculoskeletal: Denies any acute onset joint swelling, redness, loss of ROM, or weakness Neurological: No reported episodes of acute onset apraxia, aphasia, dysarthria, agnosia, amnesia, paralysis, loss of coordination, or loss of consciousness  Allergies  Ms. Markus is allergic to xolair [omalizumab]; bactrim [sulfamethoxazole-trimethoprim]; aspirin; ciprofloxacin; clindamycin; motrin [ibuprofen]; sulfa antibiotics; and nsaids.  [place]  Medical:  Ms. Lorio  has a past medical history of Anemia; Anginal pain ([place]); Anxiety; Arthralgia of hip (07/29/2015); Arthritis; Arthritis, degenerative (07/29/2015); Asthma; Cephalalgia (07/25/2014); Dependence on unknown drug ([place]); Depression; Diabetes mellitus without complication ([place]); Dysrhythmia; Eczema; Fibromyalgia; GERD (gastroesophageal reflux disease); Gonalgia (07/29/2015); Gout; H/O cardiovascular disorder (03/10/2015); H/O surgical procedure (12/05/2012); H/O thyroid disease (03/10/2015); Headache; Herpes; History of artificial joint (07/29/2015); History of hiatal hernia; Hypertension; Hypomagnesemia; Hypothyroidism; LBP (low back pain) (07/29/2015); Neuromuscular disorder ([place]); Obesity; PCOS (polycystic ovarian syndrome); Primary osteoarthritis of both knees (07/29/2015); Sleep  apnea; and Thyroid nodule (bilateral). Family: family history includes Alcohol abuse in her father and mother; Anxiety disorder in her father and mother; Breast cancer in her paternal aunt; COPD in her father; Depression in her brother, father, and mother; Diabetes in her brother, father, and mother; Hypertension in her brother, father, and mother; Kidney cancer in her mother; Kidney failure in her father; Post-traumatic stress disorder in her father; Sleep apnea in her brother, father, and mother. Surgical:  has a past surgical history that includes Laparoscopic partial gastrectomy; Shoulder arthroscopy (Right); Joint replacement (Left, hip); Carpal tunnel release (Bilateral); Diagnostic laparoscopy; Cholecystectomy; Trigger finger release (Right); and Thyroidectomy (N/A, 11/12/2015). Tobacco:  reports that she has never smoked. She has never used smokeless tobacco. Alcohol:  reports that she does not drink alcohol. Drug:  reports that she does not use drugs.  Constitutional Exam  Vitals: Blood pressure (!) 111/52, pulse 92, temperature 98.4 F (36.9 C), temperature source Oral, resp. rate 16, height 5\' 4"  (1.626 m), weight (!) 413 lb (187.3 kg), SpO2 94 %. General appearance: Well nourished, well developed, and well hydrated. In no acute distress Calculated BMI/Body habitus: Body mass index is 70.89 kg/m. (>40 kg/m2) Extreme obesity (Class III) - 254% higher incidence of chronic pain Psych/Mental status: Alert and oriented x 3 (person, place, & time) Eyes: PERLA Respiratory: No evidence of acute respiratory distress  Cervical Spine Exam  Inspection: No masses, redness, or swelling Alignment: Symmetrical Functional ROM: ROM appears unrestricted Stability: No instability detected Muscle strength & Tone: Functionally intact Sensory: Unimpaired Palpation: Non-contributory  Upper Extremity (UE) Exam    Side: Right upper extremity  Side: Left upper extremity  Inspection: No masses, redness,  swelling, or asymmetry  Inspection: No masses, redness, swelling, or asymmetry  Functional ROM: ROM appears unrestricted  Functional ROM: ROM appears unrestricted  Muscle strength & Tone: Functionally intact  Muscle strength & Tone: Functionally intact  Sensory: Unimpaired  Sensory: Unimpaired  Palpation: Non-contributory  Palpation: Non-contributory   Thoracic Spine Exam  Inspection: No masses, redness, or swelling Alignment: Symmetrical Functional ROM: ROM appears unrestricted Stability: No instability detected Sensory: Unimpaired Muscle strength & Tone: Functionally intact Palpation: Non-contributory  Lumbar Spine Exam  Inspection: No masses, redness, or swelling Alignment: Symmetrical Functional ROM: Limited ROM Stability: No instability detected Muscle strength & Tone: Functionally intact Sensory: Unimpaired Palpation: Non-contributory Provocative Tests: Lumbar Hyperextension and rotation test: evaluation deferred today       Patrick's Maneuver: evaluation deferred today  Gait & Posture Assessment  Ambulation: Patient ambulates using a cane Gait: Modified gait pattern (slower gait speed, wider stride width, and longer stance duration) associated with morbid obesity Posture: WNL   Lower Extremity Exam    Side: Right lower extremity  Side: Left lower extremity  Inspection: No masses, redness, swelling, or asymmetry  Inspection: No masses, redness, swelling, or asymmetry  Functional ROM: ROM appears unrestricted  Functional ROM: ROM appears unrestricted  Muscle strength & Tone: Functionally intact  Muscle strength & Tone: Functionally intact  Sensory: Unimpaired  Sensory: Unimpaired  Palpation: Non-contributory  Palpation: Non-contributory    Assessment & Plan  Primary Diagnosis & Pertinent Problem List: The primary encounter diagnosis was Chronic pain. Diagnoses of Long term current use of opiate analgesic, Opiate use, Chronic knee pain (Bilateral) (Location  of Primary Source of Pain), Fibromyalgia, Hypomagnesemia, Morbid obesity with BMI of 70 and over, adult ([place]) (71.75 on 08/27/2015), Primary osteoarthritis of both knees, and Primary osteoarthritis of right hip were also pertinent to this visit.  Visit Diagnosis: 1. Chronic pain   2. Long term current use of opiate analgesic   3. Opiate use   4. Chronic knee pain (Bilateral) (Location of Primary Source of Pain)   5. Fibromyalgia   6. Hypomagnesemia   7. Morbid obesity with BMI of 70 and over, adult ([place]) (71.75 on 08/27/2015)   8. Primary osteoarthritis of both knees   9. Primary osteoarthritis of right hip     Problems updated and reviewed during this visit: Problem  Osteoarthritis of hip (Right)  Dysphagia  Gastroesophageal Reflux Disease Without Esophagitis  Hyperlipidemia  Type 2 Diabetes Mellitus ([facility])  Chicken Pox (Resolved)    Problem-specific Plan(s): No problem-specific Assessment & Plan notes found for this encounter.  No new Assessment & Plan notes have been filed under this hospital service since the last note was generated. Service: Pain Management   Plan of Care   Problem List Items Addressed This Visit      High   Chronic knee pain (Bilateral) (Location of Primary Source of Pain) (Chronic)   Relevant Medications   meloxicam (MOBIC) 15 MG tablet   predniSONE (DELTASONE) 10 MG tablet   gabapentin (NEURONTIN) 800 MG tablet   Butalbital-APAP-Caff-Cod 50-300-40-30 MG CAPS   lidocaine, PF, (XYLOCAINE) 1 % SOLN injection   gabapentin (NEURONTIN) 600 MG tablet   meloxicam (MOBIC) 15 MG tablet   predniSONE (DELTASONE) 20 MG tablet   oxyCODONE-acetaminophen (PERCOCET/ROXICET) 5-325 MG tablet (Start on 06/13/2016)   oxyCODONE-acetaminophen (PERCOCET/ROXICET) 5-325 MG tablet (Start on 07/13/2016)   oxyCODONE-acetaminophen (PERCOCET/ROXICET) 5-325 MG tablet (Start on 08/12/2016)   Chronic pain (Chronic)   Relevant Medications   meloxicam (MOBIC) 15 MG tablet    predniSONE (DELTASONE) 10 MG tablet   gabapentin (NEURONTIN) 800 MG tablet   Butalbital-APAP-Caff-Cod 50-300-40-30 MG CAPS   lidocaine, PF, (XYLOCAINE) 1 % SOLN injection   gabapentin (NEURONTIN) 600 MG tablet   meloxicam (MOBIC) 15 MG tablet   predniSONE (DELTASONE) 20 MG tablet   oxyCODONE-acetaminophen (PERCOCET/ROXICET) 5-325 MG tablet (Start on 06/13/2016)   oxyCODONE-acetaminophen (PERCOCET/ROXICET) 5-325 MG tablet (Start on 07/13/2016)   oxyCODONE-acetaminophen (PERCOCET/ROXICET) 5-325 MG tablet (Start on 08/12/2016)   Fibromyalgia (Chronic)   Relevant Medications   meloxicam (MOBIC) 15 MG tablet   meloxicam (MOBIC) 15 MG tablet     Medium   Long term current use of opiate analgesic - Primary (Chronic)   Opiate use (Chronic)     Low   Hypomagnesemia   Relevant  Medications   magnesium oxide (MAG-OX) 400 MG tablet   Morbid obesity with BMI of 70 and over, adult ([place]) (71.75 on 08/27/2015)   Relevant Medications   glipiZIDE (GLUCOTROL XL) 5 MG 24 hr tablet   VICTOZA 18 MG/3ML SOPN   magnesium oxide (MAG-OX) 400 MG tablet   Other Relevant Orders   Amb ref to Medical Nutrition Therapy-MNT - Built in as part of her work-up for Dr. Duke Salvia   Ambulatory referral to General Surgery - For Bariatric surgery. (Already has appointment with Dr. Duke Salvia    Other Visit Diagnoses   None.      Pharmacotherapy (Medications Ordered): Meds ordered this encounter  Medications  . oxyCODONE-acetaminophen (PERCOCET/ROXICET) 5-325 MG tablet    Sig: Take 1 tablet by mouth every 6 (six) hours as needed for severe pain.    Dispense:  120 tablet    Refill:  0    Do not add this medication to the electronic "Automatic Refill" notification system. Patient may have prescription filled one day early if pharmacy is closed on scheduled refill date. Do not fill until: 06/13/16 To last until: 07/13/16  . oxyCODONE-acetaminophen (PERCOCET/ROXICET) 5-325 MG tablet    Sig: Take 1 tablet by mouth every 6  (six) hours as needed for severe pain.    Dispense:  120 tablet    Refill:  0    Do not add this medication to the electronic "Automatic Refill" notification system. Patient may have prescription filled one day early if pharmacy is closed on scheduled refill date. Do not fill until: 07/13/16 To last until: 08/12/16  . oxyCODONE-acetaminophen (PERCOCET/ROXICET) 5-325 MG tablet    Sig: Take 1 tablet by mouth every 6 (six) hours as needed for severe pain.    Dispense:  120 tablet    Refill:  0    Do not add this medication to the electronic "Automatic Refill" notification system. Patient may have prescription filled one day early if pharmacy is closed on scheduled refill date. Do not fill until: 08/12/16 To last until: 09/11/16  . magnesium oxide (MAG-OX) 400 MG tablet    Sig: Take 1 tablet (400 mg total) by mouth 2 (two) times daily.    Dispense:  60 tablet    Refill:  2    Do not place this medication, or any other prescription from our practice, on "Automatic Refill". Patient may have prescription filled one day early if pharmacy is closed on scheduled refill date.    Lab-work & Procedure Ordered: Orders Placed This Encounter  Procedures  . KNEE INJECTION  . KNEE INJECTION  . HIP INJECTION    Imaging Ordered: None  Interventional Therapies: Scheduled:   Palliative right intra-articular hip joint injection under fluoroscopic guidance, no sedation.  2 weeks after the hip injection, we will do bilateral Hyalgan knee injections under fluoroscopic guidance, with or without sedation.    Considering:   Series of 5 Hyalgan knee injections under fluoroscopic guidance, no sedation.    PRN Procedures:   Intra-articular Hyalgan knee injections.  Diagnostic bilateral genicular nerve block under fluoroscopic guidance, with or without sedation.  Palliative bilateral lumbar facet block under fluoroscopic guidance and IV sedation.    Referral(s) or Consult(s): None at this time.  New  Prescriptions   No medications on file    Medications administered during this visit: Ms. Erps had no medications administered during this visit.  Requested PM Follow-up: Return in 4 months (on 08/25/2016) for Med-Mgmt, In addition, Schedule Procedure, (ASAA), (PRN) Procedure.  Future Appointments Date Time Provider [place]  05/11/2016 11:00 AM [facility]  05/18/2016 9:45 AM Milinda Pointer, MD [facility] None  06/01/2016 10:15 AM Milinda Pointer, MD [facility] None  06/15/2016 10:15 AM Milinda Pointer, MD [facility] None  07/01/2016 1:30 PM Himabindu Einar Grad, MD [facility] None  09/01/2016 11:20 AM Milinda Pointer, MD [facility] None    Primary Care Physician: Karen Crouch, MD Location: [facility] Note by: Karen Lewis. [name], M.D, DABA, DABAPM, DABPM, DABIPP, FIPP  Pain Score Disclaimer: We use the NRS-11 scale. This is a self-reported, subjective measurement of pain severity with only modest accuracy. It is used primarily to identify changes within a particular patient. It must be understood that outpatient pain scales are significantly less accurate that those used for research, where they can be applied under ideal controlled circumstances with minimal exposure to variables. In reality, the score is likely to be a combination of pain intensity and pain affect, where pain affect describes the degree of emotional arousal or changes in action readiness caused by the sensory experience of pain. Factors such as social and work situation, setting, emotional state, anxiety levels, expectation, and prior pain experience may influence pain perception and show large inter-individual differences that may also be affected by time variables.  Patient instructions provided during this appointment: Patient Instructions   Trigger Point Injection Trigger points are areas where you have muscle pain. A trigger point injection is a shot given in the  trigger point to relieve that pain. A trigger point might feel like a knot in your muscle. It hurts to press on a trigger point. Sometimes the pain spreads out (radiates) to other parts of the body. For example, pressing on a trigger point in your shoulder might cause pain in your arm or neck. You might have one trigger point. Or, you might have more than one. People often have trigger points in their upper back and lower back. They also occur often in the neck and shoulders. Pain from a trigger point lasts for a long time. It can make it hard to keep moving. You might not be able to do the exercise or physical therapy that could help you deal with the pain. A trigger point injection may help. It does not work for everyone. But, it may relieve your pain for a few days or a few months. A trigger point injection does not cure long-lasting (chronic) pain. LET YOUR CAREGIVER KNOW ABOUT:  Any allergies (especially to latex, lidocaine, or steroids).  Blood-thinning medicines that you take. These drugs can lead to bleeding or bruising after an injection. They include:  Aspirin.  Ibuprofen.  Clopidogrel.  Warfarin.  Other medicines you take. This includes all vitamins, herbs, eyedrops, over-the-counter medicines, and creams.  Use of steroids.  Recent infections.  Past problems with numbing medicines.  Bleeding problems.  Surgeries you have had.  Other health problems. RISKS AND COMPLICATIONS A trigger point injection is a safe treatment. However, problems may develop, such as:  Minor side effects usually go away in 1 to 2 days. These may include:  Soreness.  Bruising.  Stiffness.  More serious problems are rare. But, they may include:  Bleeding under the skin (hematoma).  Skin infection.  Breaking off of the needle under your skin.  Lung puncture.  The trigger point injection may not work for you. BEFORE THE PROCEDURE You may need to stop taking any medicine that thins  your blood. This is to prevent bleeding and bruising.  Usually these medicines are stopped several days before the injection. No other preparation is needed. PROCEDURE  A trigger point injection can be given in your caregiver's office or in a clinic. Each injection takes 2 minutes or less.  Your caregiver will feel for trigger points. The caregiver may use a marker to circle the area for the injection.  The skin over the trigger point will be washed with a germ-killing (antiseptic) solution.  The caregiver pinches the spot for the injection.  Then, a very thin needle is used for the shot. You may feel pain or a twitching feeling when the needle enters the trigger point.  A numbing solution may be injected into the trigger point. Sometimes a drug to keep down swelling, redness, and warmth (inflammation) is also injected.  Your caregiver moves the needle around the trigger zone until the tightness and twitching goes away.  After the injection, your caregiver may put gentle pressure over the injection site.  Then it is covered with a bandage. AFTER THE PROCEDURE  You can go right home after the injection.  The bandage can be taken off after a few hours.  You may feel sore and stiff for 1 to 2 days.  Go back to your regular activities slowly. Your caregiver may ask you to stretch your muscles. Do not do anything that takes extra energy for a few days.  Follow your caregiver's instructions to manage and treat other pain.   This information is not intended to replace advice given to you by your health care provider. Make sure you discuss any questions you have with your health care provider.   Document Released: 08/19/2011 Document Revised: 12/25/2012 Document Reviewed: 08/19/2011 Elsevier Interactive Patient Education 2016 [place]  What are the risk, side effects and possible complications? Generally speaking, most procedures are safe.  However,  with any procedure there are risks, side effects, and the possibility of complications.  The risks and complications are dependent upon the sites that are lesioned, or the type of nerve block to be performed.  The closer the procedure is to the spine, the more serious the risks are.  Great care is taken when placing the radio frequency needles, block needles or lesioning probes, but sometimes complications can occur. 1. Infection: Any time there is an injection through the skin, there is a risk of infection.  This is why sterile conditions are used for these blocks.  There are four possible types of infection. 1. Localized skin infection. 2. Central Nervous System Infection-This can be in the form of Meningitis, which can be deadly. 3. Epidural Infections-This can be in the form of an epidural abscess, which can cause pressure inside of the spine, causing compression of the spinal cord with subsequent paralysis. This would require an emergency surgery to decompress, and there are no guarantees that the patient would recover from the paralysis. 4. Discitis-This is an infection of the intervertebral discs.  It occurs in about 1% of discography procedures.  It is difficult to treat and it may lead to surgery.        2. Pain: the needles have to go through skin and soft tissues, will cause soreness.       3. Damage to internal structures:  The nerves to be lesioned may be near blood vessels or    other nerves which can be potentially damaged.       4. Bleeding: Bleeding is more common if the patient is taking  blood thinners such as  aspirin, Coumadin, Ticiid, Plavix, etc., or if he/she have some genetic predisposition  such as hemophilia. Bleeding into the spinal canal can cause compression of the spinal  cord with subsequent paralysis.  This would require an emergency surgery to  decompress and there are no guarantees that the patient would recover from the  paralysis.       5. Pneumothorax:  Puncturing of a  lung is a possibility, every time a needle is introduced in  the area of the chest or upper back.  Pneumothorax refers to free air around the  collapsed lung(s), inside of the thoracic cavity (chest cavity).  Another two possible  complications related to a similar event would include: Hemothorax and Chylothorax.   These are variations of the Pneumothorax, where instead of air around the collapsed  lung(s), you may have blood or chyle, respectively.       6. Spinal headaches: They may occur with any procedures in the area of the spine.       7. Persistent CSF (Cerebro-Spinal Fluid) leakage: This is a rare problem, but may occur  with prolonged intrathecal or epidural catheters either due to the formation of a fistulous  track or a dural tear.       8. Nerve damage: By working so close to the spinal cord, there is always a possibility of  nerve damage, which could be as serious as a permanent spinal cord injury with  paralysis.       9. Death:  Although rare, severe deadly allergic reactions known as "Anaphylactic  reaction" can occur to any of the medications used.      10. Worsening of the symptoms:  We can always make thing worse.  What are the chances of something like this happening? Chances of any of this occuring are extremely low.  By statistics, you have more of a chance of getting killed in a motor vehicle accident: while driving to the hospital than any of the above occurring .  Nevertheless, you should be aware that they are possibilities.  In general, it is similar to taking a shower.  Everybody knows that you can slip, hit your head and get killed.  Does that mean that you should not shower again?  Nevertheless always keep in mind that statistics do not mean anything if you happen to be on the wrong side of them.  Even if a procedure has a 1 (one) in a 1,000,000 (million) chance of going wrong, it you happen to be that one..Also, keep in mind that by statistics, you have more of a chance of  having something go wrong when taking medications.  Who should not have this procedure? If you are on a blood thinning medication (e.g. Coumadin, Plavix, see list of "Blood Thinners"), or if you have an active infection going on, you should not have the procedure.  If you are taking any blood thinners, please inform your physician.  How should I prepare for this procedure?  Do not eat or drink anything at least six hours prior to the procedure.  Bring a driver with you .  It cannot be a taxi.  Come accompanied by an adult that can drive you back, and that is strong enough to help you if your legs get weak or numb from the local anesthetic.  Take all of your medicines the morning of the procedure with just enough water to swallow them.  If you have diabetes, make sure that  you are scheduled to have your procedure done first thing in the morning, whenever possible.  If you have diabetes, take only half of your insulin dose and notify our nurse that you have done so as soon as you arrive at the clinic.  If you are diabetic, but only take blood sugar pills (oral hypoglycemic), then do not take them on the morning of your procedure.  You may take them after you have had the procedure.  Do not take aspirin or any aspirin-containing medications, at least eleven (11) days prior to the procedure.  They may prolong bleeding.  Wear loose fitting clothing that may be easy to take off and that you would not mind if it got stained with Betadine or blood.  Do not wear any jewelry or perfume  Remove any nail coloring.  It will interfere with some of our monitoring equipment.  NOTE: Remember that this is not meant to be interpreted as a complete list of all possible complications.  Unforeseen problems may occur.  BLOOD THINNERS The following drugs contain aspirin or other products, which can cause increased bleeding during surgery and should not be taken for 2 weeks prior to and 1 week after surgery.   If you should need take something for relief of minor pain, you may take acetaminophen which is found in Tylenol,m Datril, Anacin-3 and Panadol. It is not blood thinner. The products listed below are.  Do not take any of the products listed below in addition to any listed on your instruction sheet.  A.P.C or A.P.C with Codeine Codeine Phosphate Capsules #3 Ibuprofen Ridaura  ABC compound Congesprin Imuran rimadil  Advil Cope Indocin Robaxisal  Alka-Seltzer Effervescent Pain Reliever and Antacid Coricidin or Coricidin-D  Indomethacin Rufen  Alka-Seltzer plus Cold Medicine Cosprin Ketoprofen S-A-C Tablets  Anacin Analgesic Tablets or Capsules Coumadin Korlgesic Salflex  Anacin Extra Strength Analgesic tablets or capsules CP-2 Tablets Lanoril Salicylate  Anaprox Cuprimine Capsules Levenox Salocol  Anexsia-D Dalteparin Magan Salsalate  Anodynos Darvon compound Magnesium Salicylate Sine-off  Ansaid Dasin Capsules Magsal Sodium Salicylate  Anturane Depen Capsules Marnal Soma  APF Arthritis pain formula Dewitt's Pills Measurin Stanback  Argesic Dia-Gesic Meclofenamic Sulfinpyrazone  Arthritis Bayer Timed Release Aspirin Diclofenac Meclomen Sulindac  Arthritis pain formula Anacin Dicumarol Medipren Supac  Analgesic (Safety coated) Arthralgen Diffunasal Mefanamic Suprofen  Arthritis Strength Bufferin Dihydrocodeine Mepro Compound Suprol  Arthropan liquid Dopirydamole Methcarbomol with Aspirin Synalgos  ASA tablets/Enseals Disalcid Micrainin Tagament  Ascriptin Doan's Midol Talwin  Ascriptin A/D Dolene Mobidin Tanderil  Ascriptin Extra Strength Dolobid Moblgesic Ticlid  Ascriptin with Codeine Doloprin or Doloprin with Codeine Momentum Tolectin  Asperbuf Duoprin Mono-gesic Trendar  Aspergum Duradyne Motrin or Motrin IB Triminicin  Aspirin plain, buffered or enteric coated Durasal Myochrisine Trigesic  Aspirin Suppositories Easprin Nalfon Trillsate  Aspirin with Codeine Ecotrin Regular or Extra  Strength Naprosyn Uracel  Atromid-S Efficin Naproxen Ursinus  Auranofin Capsules Elmiron Neocylate Vanquish  Axotal Emagrin Norgesic Verin  Azathioprine Empirin or Empirin with Codeine Normiflo Vitamin E  Azolid Emprazil Nuprin Voltaren  Bayer Aspirin plain, buffered or children's or timed BC Tablets or powders Encaprin Orgaran Warfarin Sodium  Buff-a-Comp Enoxaparin Orudis Zorpin  Buff-a-Comp with Codeine Equegesic Os-Cal-Gesic   Buffaprin Excedrin plain, buffered or Extra Strength Oxalid   Bufferin Arthritis Strength Feldene Oxphenbutazone   Bufferin plain or Extra Strength Feldene Capsules Oxycodone with Aspirin   Bufferin with Codeine Fenoprofen Fenoprofen Pabalate or Pabalate-SF   Buffets II Flogesic Panagesic   Buffinol plain or  Extra Strength Florinal or Florinal with Codeine Panwarfarin   Buf-Tabs Flurbiprofen Penicillamine   Butalbital Compound Four-way cold tablets Penicillin   Butazolidin Fragmin Pepto-Bismol   Carbenicillin Geminisyn Percodan   Carna Arthritis Reliever Geopen Persantine   Carprofen Gold's salt Persistin   Chloramphenicol Goody's Phenylbutazone   Chloromycetin Haltrain Piroxlcam   Clmetidine heparin Plaquenil   Cllnoril Hyco-pap Ponstel   Clofibrate Hydroxy chloroquine Propoxyphen         Before stopping any of these medications, be sure to consult the physician who ordered them.  Some, such as Coumadin (Warfarin) are ordered to prevent or treat serious conditions such as "deep thrombosis", "pumonary embolisms", and other heart problems.  The amount of time that you may need off of the medication may also vary with the medication and the reason for which you were taking it.  If you are taking any of these medications, please make sure you notify your pain physician before you undergo any procedures.  Patient given pain scale handout.

## 2016-05-10 NOTE — Patient Instructions (Signed)
Trigger Point Injection Trigger points are areas where you have muscle pain. A trigger point injection is a shot given in the trigger point to relieve that pain. A trigger point might feel like a knot in your muscle. It hurts to press on a trigger point. Sometimes the pain spreads out (radiates) to other parts of the body. For example, pressing on a trigger point in your shoulder might cause pain in your arm or neck. You might have one trigger point. Or, you might have more than one. People often have trigger points in their upper back and lower back. They also occur often in the neck and shoulders. Pain from a trigger point lasts for a long time. It can make it hard to keep moving. You might not be able to do the exercise or physical therapy that could help you deal with the pain. A trigger point injection may help. It does not work for everyone. But, it may relieve your pain for a few days or a few months. A trigger point injection does not cure long-lasting (chronic) pain. LET YOUR CAREGIVER KNOW ABOUT:  Any allergies (especially to latex, lidocaine, or steroids).  Blood-thinning medicines that you take. These drugs can lead to bleeding or bruising after an injection. They include:  Aspirin.  Ibuprofen.  Clopidogrel.  Warfarin.  Other medicines you take. This includes all vitamins, herbs, eyedrops, over-the-counter medicines, and creams.  Use of steroids.  Recent infections.  Past problems with numbing medicines.  Bleeding problems.  Surgeries you have had.  Other health problems. RISKS AND COMPLICATIONS A trigger point injection is a safe treatment. However, problems may develop, such as:  Minor side effects usually go away in 1 to 2 days. These may include:  Soreness.  Bruising.  Stiffness.  More serious problems are rare. But, they may include:  Bleeding under the skin (hematoma).  Skin infection.  Breaking off of the needle under your skin.  Lung  puncture.  The trigger point injection may not work for you. BEFORE THE PROCEDURE You may need to stop taking any medicine that thins your blood. This is to prevent bleeding and bruising. Usually these medicines are stopped several days before the injection. No other preparation is needed. PROCEDURE  A trigger point injection can be given in your caregiver's office or in a clinic. Each injection takes 2 minutes or less.  Your caregiver will feel for trigger points. The caregiver may use a marker to circle the area for the injection.  The skin over the trigger point will be washed with a germ-killing (antiseptic) solution.  The caregiver pinches the spot for the injection.  Then, a very thin needle is used for the shot. You may feel pain or a twitching feeling when the needle enters the trigger point.  A numbing solution may be injected into the trigger point. Sometimes a drug to keep down swelling, redness, and warmth (inflammation) is also injected.  Your caregiver moves the needle around the trigger zone until the tightness and twitching goes away.  After the injection, your caregiver may put gentle pressure over the injection site.  Then it is covered with a bandage. AFTER THE PROCEDURE  You can go right home after the injection.  The bandage can be taken off after a few hours.  You may feel sore and stiff for 1 to 2 days.  Go back to your regular activities slowly. Your caregiver may ask you to stretch your muscles. Do not do anything that takes   extra energy for a few days.  Follow your caregiver's instructions to manage and treat other pain.   This information is not intended to replace advice given to you by your health care provider. Make sure you discuss any questions you have with your health care provider.   Document Released: 08/19/2011 Document Revised: 12/25/2012 Document Reviewed: 08/19/2011 Elsevier Interactive Patient Education 2016 [employer]  What are the risk, side effects and possible complications? Generally speaking, most procedures are safe.  However, with any procedure there are risks, side effects, and the possibility of complications.  The risks and complications are dependent upon the sites that are lesioned, or the type of nerve block to be performed.  The closer the procedure is to the spine, the more serious the risks are.  Great care is taken when placing the radio frequency needles, block needles or lesioning probes, but sometimes complications can occur. 1. Infection: Any time there is an injection through the skin, there is a risk of infection.  This is why sterile conditions are used for these blocks.  There are four possible types of infection. 1. Localized skin infection. 2. Central Nervous System Infection-This can be in the form of Meningitis, which can be deadly. 3. Epidural Infections-This can be in the form of an epidural abscess, which can cause pressure inside of the spine, causing compression of the spinal cord with subsequent paralysis. This would require an emergency surgery to decompress, and there are no guarantees that the patient would recover from the paralysis. 4. Discitis-This is an infection of the intervertebral discs.  It occurs in about 1% of discography procedures.  It is difficult to treat and it may lead to surgery.        2. Pain: the needles have to go through skin and soft tissues, will cause soreness.       3. Damage to internal structures:  The nerves to be lesioned may be near blood vessels or    other nerves which can be potentially damaged.       4. Bleeding: Bleeding is more common if the patient is taking blood thinners such as  aspirin, Coumadin, Ticiid, Plavix, etc., or if he/she have some genetic predisposition  such as hemophilia. Bleeding into the spinal canal can cause compression of the spinal  cord with subsequent paralysis.  This would require an emergency  surgery to  decompress and there are no guarantees that the patient would recover from the  paralysis.       5. Pneumothorax:  Puncturing of a lung is a possibility, every time a needle is introduced in  the area of the chest or upper back.  Pneumothorax refers to free air around the  collapsed lung(s), inside of the thoracic cavity (chest cavity).  Another two possible  complications related to a similar event would include: Hemothorax and Chylothorax.   These are variations of the Pneumothorax, where instead of air around the collapsed  lung(s), you may have blood or chyle, respectively.       6. Spinal headaches: They may occur with any procedures in the area of the spine.       7. Persistent CSF (Cerebro-Spinal Fluid) leakage: This is a rare problem, but may occur  with prolonged intrathecal or epidural catheters either due to the formation of a fistulous  track or a dural tear.       8. Nerve damage: By working so close to the spinal cord, there  is always a possibility of  nerve damage, which could be as serious as a permanent spinal cord injury with  paralysis.       9. Death:  Although rare, severe deadly allergic reactions known as "Anaphylactic  reaction" can occur to any of the medications used.      10. Worsening of the symptoms:  We can always make thing worse.  What are the chances of something like this happening? Chances of any of this occuring are extremely low.  By statistics, you have more of a chance of getting killed in a motor vehicle accident: while driving to the hospital than any of the above occurring .  Nevertheless, you should be aware that they are possibilities.  In general, it is similar to taking a shower.  Everybody knows that you can slip, hit your head and get killed.  Does that mean that you should not shower again?  Nevertheless always keep in mind that statistics do not mean anything if you happen to be on the wrong side of them.  Even if a procedure has a 1 (one) in a  1,000,000 (million) chance of going wrong, it you happen to be that one..Also, keep in mind that by statistics, you have more of a chance of having something go wrong when taking medications.  Who should not have this procedure? If you are on a blood thinning medication (e.g. Coumadin, Plavix, see list of "Blood Thinners"), or if you have an active infection going on, you should not have the procedure.  If you are taking any blood thinners, please inform your physician.  How should I prepare for this procedure?  Do not eat or drink anything at least six hours prior to the procedure.  Bring a driver with you .  It cannot be a taxi.  Come accompanied by an adult that can drive you back, and that is strong enough to help you if your legs get weak or numb from the local anesthetic.  Take all of your medicines the morning of the procedure with just enough water to swallow them.  If you have diabetes, make sure that you are scheduled to have your procedure done first thing in the morning, whenever possible.  If you have diabetes, take only half of your insulin dose and notify our nurse that you have done so as soon as you arrive at the clinic.  If you are diabetic, but only take blood sugar pills (oral hypoglycemic), then do not take them on the morning of your procedure.  You may take them after you have had the procedure.  Do not take aspirin or any aspirin-containing medications, at least eleven (11) days prior to the procedure.  They may prolong bleeding.  Wear loose fitting clothing that may be easy to take off and that you would not mind if it got stained with Betadine or blood.  Do not wear any jewelry or perfume  Remove any nail coloring.  It will interfere with some of our monitoring equipment.  NOTE: Remember that this is not meant to be interpreted as a complete list of all possible complications.  Unforeseen problems may occur.  BLOOD THINNERS The following drugs contain aspirin  or other products, which can cause increased bleeding during surgery and should not be taken for 2 weeks prior to and 1 week after surgery.  If you should need take something for relief of minor pain, you may take acetaminophen which is found in Tylenol,m Datril, Anacin-3  and Panadol. It is not blood thinner. The products listed below are.  Do not take any of the products listed below in addition to any listed on your instruction sheet.  A.P.C or A.P.C with Codeine Codeine Phosphate Capsules #3 Ibuprofen Ridaura  ABC compound Congesprin Imuran rimadil  Advil Cope Indocin Robaxisal  Alka-Seltzer Effervescent Pain Reliever and Antacid Coricidin or Coricidin-D  Indomethacin Rufen  Alka-Seltzer plus Cold Medicine Cosprin Ketoprofen S-A-C Tablets  Anacin Analgesic Tablets or Capsules Coumadin Korlgesic Salflex  Anacin Extra Strength Analgesic tablets or capsules CP-2 Tablets Lanoril Salicylate  Anaprox Cuprimine Capsules Levenox Salocol  Anexsia-D Dalteparin Magan Salsalate  Anodynos Darvon compound Magnesium Salicylate Sine-off  Ansaid Dasin Capsules Magsal Sodium Salicylate  Anturane Depen Capsules Marnal Soma  APF Arthritis pain formula Dewitt's Pills Measurin Stanback  Argesic Dia-Gesic Meclofenamic Sulfinpyrazone  Arthritis Bayer Timed Release Aspirin Diclofenac Meclomen Sulindac  Arthritis pain formula Anacin Dicumarol Medipren Supac  Analgesic (Safety coated) Arthralgen Diffunasal Mefanamic Suprofen  Arthritis Strength Bufferin Dihydrocodeine Mepro Compound Suprol  Arthropan liquid Dopirydamole Methcarbomol with Aspirin Synalgos  ASA tablets/Enseals Disalcid Micrainin Tagament  Ascriptin Doan's Midol Talwin  Ascriptin A/D Dolene Mobidin Tanderil  Ascriptin Extra Strength Dolobid Moblgesic Ticlid  Ascriptin with Codeine Doloprin or Doloprin with Codeine Momentum Tolectin  Asperbuf Duoprin Mono-gesic Trendar  Aspergum Duradyne Motrin or Motrin IB Triminicin  Aspirin plain, buffered or  enteric coated Durasal Myochrisine Trigesic  Aspirin Suppositories Easprin Nalfon Trillsate  Aspirin with Codeine Ecotrin Regular or Extra Strength Naprosyn Uracel  Atromid-S Efficin Naproxen Ursinus  Auranofin Capsules Elmiron Neocylate Vanquish  Axotal Emagrin Norgesic Verin  Azathioprine Empirin or Empirin with Codeine Normiflo Vitamin E  Azolid Emprazil Nuprin Voltaren  Bayer Aspirin plain, buffered or children's or timed BC Tablets or powders Encaprin Orgaran Warfarin Sodium  Buff-a-Comp Enoxaparin Orudis Zorpin  Buff-a-Comp with Codeine Equegesic Os-Cal-Gesic   Buffaprin Excedrin plain, buffered or Extra Strength Oxalid   Bufferin Arthritis Strength Feldene Oxphenbutazone   Bufferin plain or Extra Strength Feldene Capsules Oxycodone with Aspirin   Bufferin with Codeine Fenoprofen Fenoprofen Pabalate or Pabalate-SF   Buffets II Flogesic Panagesic   Buffinol plain or Extra Strength Florinal or Florinal with Codeine Panwarfarin   Buf-Tabs Flurbiprofen Penicillamine   Butalbital Compound Four-way cold tablets Penicillin   Butazolidin Fragmin Pepto-Bismol   Carbenicillin Geminisyn Percodan   Carna Arthritis Reliever Geopen Persantine   Carprofen Gold's salt Persistin   Chloramphenicol Goody's Phenylbutazone   Chloromycetin Haltrain Piroxlcam   Clmetidine heparin Plaquenil   Cllnoril Hyco-pap Ponstel   Clofibrate Hydroxy chloroquine Propoxyphen         Before stopping any of these medications, be sure to consult the physician who ordered them.  Some, such as Coumadin (Warfarin) are ordered to prevent or treat serious conditions such as "deep thrombosis", "pumonary embolisms", and other heart problems.  The amount of time that you may need off of the medication may also vary with the medication and the reason for which you were taking it.  If you are taking any of these medications, please make sure you notify your pain physician before you undergo any procedures.  Patient given pain  scale handout.

## 2016-05-10 NOTE — Progress Notes (Signed)
Safety precautions to be maintained throughout the outpatient stay will include: orient to surroundings, keep bed in low position, maintain call bell within reach at all times, provide assistance with transfer out of bed and ambulation.  Pill count / Patient did not bring pill bottle; patient has rx left at home; will bring back to next appt.

## 2016-05-11 ENCOUNTER — Other Ambulatory Visit: Payer: Self-pay | Admitting: Family Medicine

## 2016-05-11 ENCOUNTER — Ambulatory Visit
Admission: RE | Admit: 2016-05-11 | Discharge: 2016-05-11 | Disposition: A | Discharge status: Home / Self Care | Payer: Commercial Managed Care - HMO | Source: Ambulatory Visit | Attending: Family Medicine | Admitting: Family Medicine

## 2016-05-11 ENCOUNTER — Ambulatory Visit
Admission: RE | Admit: 2016-05-11 | Discharge: 2016-05-11 | Disposition: A | Discharge status: Home / Self Care | Payer: Commercial Managed Care - HMO | Source: Ambulatory Visit | Attending: Bariatrics | Admitting: Bariatrics

## 2016-05-11 ENCOUNTER — Ambulatory Visit
Admission: RE | Admit: 2016-05-11 | Discharge: 2016-05-11 | Disposition: A | Discharge status: Home / Self Care | Payer: Commercial Managed Care - HMO | Source: Ambulatory Visit | Attending: Otolaryngology | Admitting: Otolaryngology

## 2016-05-11 ENCOUNTER — Other Ambulatory Visit: Payer: Self-pay

## 2016-05-11 ENCOUNTER — Other Ambulatory Visit (HOSPITAL_COMMUNITY): Payer: Self-pay | Admitting: Otolaryngology

## 2016-05-11 DIAGNOSIS — J321 Chronic frontal sinusitis: Secondary | ICD-10-CM | POA: Diagnosis not present

## 2016-05-11 DIAGNOSIS — R300 Dysuria: Secondary | ICD-10-CM | POA: Diagnosis not present

## 2016-05-11 DIAGNOSIS — N390 Urinary tract infection, site not specified: Secondary | ICD-10-CM | POA: Diagnosis not present

## 2016-05-11 DIAGNOSIS — J342 Deviated nasal septum: Secondary | ICD-10-CM | POA: Insufficient documentation

## 2016-05-11 DIAGNOSIS — R918 Other nonspecific abnormal finding of lung field: Secondary | ICD-10-CM | POA: Diagnosis not present

## 2016-05-11 LAB — POCT I-STAT CREATININE: Creatinine, Ser: 0.8 mg/dL (ref 0.44–1.00)

## 2016-05-11 MED ORDER — IOPAMIDOL (ISOVUE-370) INJECTION 76%
75.0000 mL | Freq: Once | INTRAVENOUS | Status: AC | PRN
  Administered 2016-05-11: 75 mL via INTRAVENOUS

## 2016-05-18 ENCOUNTER — Ambulatory Visit: Payer: Commercial Managed Care - HMO | Admitting: Pain Medicine

## 2016-05-19 DIAGNOSIS — J322 Chronic ethmoidal sinusitis: Secondary | ICD-10-CM | POA: Diagnosis not present

## 2016-05-19 DIAGNOSIS — J320 Chronic maxillary sinusitis: Secondary | ICD-10-CM | POA: Diagnosis not present

## 2016-05-19 DIAGNOSIS — J301 Allergic rhinitis due to pollen: Secondary | ICD-10-CM | POA: Diagnosis not present

## 2016-05-20 DIAGNOSIS — Z5181 Encounter for therapeutic drug level monitoring: Secondary | ICD-10-CM | POA: Diagnosis not present

## 2016-05-24 ENCOUNTER — Other Ambulatory Visit: Payer: Self-pay | Admitting: Psychiatry

## 2016-05-25 ENCOUNTER — Ambulatory Visit: Payer: Commercial Managed Care - HMO | Attending: Pain Medicine | Admitting: Pain Medicine

## 2016-05-25 ENCOUNTER — Ambulatory Visit
Admission: RE | Admit: 2016-05-25 | Discharge: 2016-05-25 | Disposition: A | Discharge status: Home / Self Care | Payer: Commercial Managed Care - HMO | Source: Ambulatory Visit | Attending: Pain Medicine | Admitting: Pain Medicine

## 2016-05-25 ENCOUNTER — Encounter: Payer: Self-pay | Admitting: Pain Medicine

## 2016-05-25 VITALS — BP 131/53 | HR 108 | Temp 98.6°F | Resp 21 | Ht 64.0 in | Wt >= 6400 oz

## 2016-05-25 DIAGNOSIS — M797 Fibromyalgia: Secondary | ICD-10-CM | POA: Insufficient documentation

## 2016-05-25 DIAGNOSIS — E119 Type 2 diabetes mellitus without complications: Secondary | ICD-10-CM | POA: Diagnosis not present

## 2016-05-25 DIAGNOSIS — F5081 Binge eating disorder: Secondary | ICD-10-CM | POA: Diagnosis not present

## 2016-05-25 DIAGNOSIS — G894 Chronic pain syndrome: Secondary | ICD-10-CM | POA: Insufficient documentation

## 2016-05-25 DIAGNOSIS — M109 Gout, unspecified: Secondary | ICD-10-CM | POA: Insufficient documentation

## 2016-05-25 DIAGNOSIS — G4733 Obstructive sleep apnea (adult) (pediatric): Secondary | ICD-10-CM | POA: Diagnosis not present

## 2016-05-25 DIAGNOSIS — J45909 Unspecified asthma, uncomplicated: Secondary | ICD-10-CM | POA: Diagnosis not present

## 2016-05-25 DIAGNOSIS — Z79891 Long term (current) use of opiate analgesic: Secondary | ICD-10-CM | POA: Diagnosis not present

## 2016-05-25 DIAGNOSIS — G8929 Other chronic pain: Secondary | ICD-10-CM | POA: Diagnosis not present

## 2016-05-25 DIAGNOSIS — K219 Gastro-esophageal reflux disease without esophagitis: Secondary | ICD-10-CM | POA: Insufficient documentation

## 2016-05-25 DIAGNOSIS — M25561 Pain in right knee: Secondary | ICD-10-CM | POA: Diagnosis not present

## 2016-05-25 DIAGNOSIS — G43909 Migraine, unspecified, not intractable, without status migrainosus: Secondary | ICD-10-CM | POA: Insufficient documentation

## 2016-05-25 DIAGNOSIS — F411 Generalized anxiety disorder: Secondary | ICD-10-CM | POA: Insufficient documentation

## 2016-05-25 DIAGNOSIS — M25551 Pain in right hip: Secondary | ICD-10-CM

## 2016-05-25 DIAGNOSIS — G4700 Insomnia, unspecified: Secondary | ICD-10-CM | POA: Diagnosis not present

## 2016-05-25 DIAGNOSIS — E282 Polycystic ovarian syndrome: Secondary | ICD-10-CM | POA: Diagnosis not present

## 2016-05-25 DIAGNOSIS — E559 Vitamin D deficiency, unspecified: Secondary | ICD-10-CM | POA: Diagnosis not present

## 2016-05-25 DIAGNOSIS — I10 Essential (primary) hypertension: Secondary | ICD-10-CM | POA: Diagnosis not present

## 2016-05-25 DIAGNOSIS — M25562 Pain in left knee: Secondary | ICD-10-CM

## 2016-05-25 DIAGNOSIS — Z9889 Other specified postprocedural states: Secondary | ICD-10-CM | POA: Diagnosis not present

## 2016-05-25 DIAGNOSIS — M1611 Unilateral primary osteoarthritis, right hip: Secondary | ICD-10-CM | POA: Insufficient documentation

## 2016-05-25 DIAGNOSIS — M170 Bilateral primary osteoarthritis of knee: Secondary | ICD-10-CM | POA: Insufficient documentation

## 2016-05-25 DIAGNOSIS — M4316 Spondylolisthesis, lumbar region: Secondary | ICD-10-CM | POA: Insufficient documentation

## 2016-05-25 DIAGNOSIS — Z9884 Bariatric surgery status: Secondary | ICD-10-CM | POA: Insufficient documentation

## 2016-05-25 DIAGNOSIS — F338 Other recurrent depressive disorders: Secondary | ICD-10-CM | POA: Diagnosis not present

## 2016-05-25 DIAGNOSIS — L309 Dermatitis, unspecified: Secondary | ICD-10-CM | POA: Insufficient documentation

## 2016-05-25 DIAGNOSIS — E042 Nontoxic multinodular goiter: Secondary | ICD-10-CM | POA: Insufficient documentation

## 2016-05-25 DIAGNOSIS — E079 Disorder of thyroid, unspecified: Secondary | ICD-10-CM | POA: Insufficient documentation

## 2016-05-25 DIAGNOSIS — R1310 Dysphagia, unspecified: Secondary | ICD-10-CM | POA: Insufficient documentation

## 2016-05-25 DIAGNOSIS — Z6841 Body Mass Index (BMI) 40.0 and over, adult: Secondary | ICD-10-CM | POA: Insufficient documentation

## 2016-05-25 DIAGNOSIS — Z8614 Personal history of Methicillin resistant Staphylococcus aureus infection: Secondary | ICD-10-CM | POA: Insufficient documentation

## 2016-05-25 DIAGNOSIS — M47816 Spondylosis without myelopathy or radiculopathy, lumbar region: Secondary | ICD-10-CM | POA: Insufficient documentation

## 2016-05-25 DIAGNOSIS — E785 Hyperlipidemia, unspecified: Secondary | ICD-10-CM | POA: Diagnosis not present

## 2016-05-25 DIAGNOSIS — Z96642 Presence of left artificial hip joint: Secondary | ICD-10-CM | POA: Insufficient documentation

## 2016-05-25 MED ORDER — ROPIVACAINE HCL 2 MG/ML IJ SOLN
9.0000 mL | Freq: Once | INTRAMUSCULAR | Status: AC
  Administered 2016-05-25: 9 mL
  Filled 2016-05-25: qty 10

## 2016-05-25 MED ORDER — IOPAMIDOL (ISOVUE-M 200) INJECTION 41%
10.0000 mL | Freq: Once | INTRAMUSCULAR | Status: AC
  Administered 2016-05-25: 10 mL via INTRA_ARTICULAR
  Filled 2016-05-25: qty 10

## 2016-05-25 MED ORDER — METHYLPREDNISOLONE ACETATE 80 MG/ML IJ SUSP
80.0000 mg | Freq: Once | INTRAMUSCULAR | Status: AC
  Administered 2016-05-25: 80 mg
  Filled 2016-05-25: qty 1

## 2016-05-25 MED ORDER — LIDOCAINE HCL (PF) 1 % IJ SOLN
10.0000 mL | Freq: Once | INTRAMUSCULAR | Status: AC
  Administered 2016-05-25: 10 mL
  Filled 2016-05-25: qty 10

## 2016-05-25 NOTE — Patient Instructions (Signed)

## 2016-05-25 NOTE — Progress Notes (Signed)
Safety precautions to be maintained throughout the outpatient stay will include: orient to surroundings, keep bed in low position, maintain call bell within reach at all times, provide assistance with transfer out of bed and ambulation.  Oxycodone 5/325mg  12.5 out of 120 remaining. Filled 12-19-15. Oxycodone 5/362mf 120 out od 120 remaining. Filled 05-24-16.

## 2016-05-25 NOTE — Progress Notes (Signed)
Patient's Name: Karen Lewis  MRN: [id number]  Referring Provider: Milinda Pointer, MD  DOB: 05/05/1971  PCP: Idelle Crouch, MD  DOS: 05/25/2016  Note by: Kathlen Brunswick. [name], MD  Service setting: Ambulatory outpatient  Location: [facility] (AMB) Pain Management Facility  Visit type: Procedure  Specialty: Interventional Pain Management  Patient type: Established   Primary Reason for Visit: Interventional Pain Management Treatment. CC: Hip Pain (right)  Procedure:  Anesthesia, Analgesia, Anxiolysis:  Type: Palliative Intra-Articular Hip Injection Region:  Posterolateral hip joint area. Level: Lower pelvic and hip joint level. Laterality: Right  Indications: 1. Chronic hip pain, right   2. Primary osteoarthritis of right hip   3. Chronic knee pain (Bilateral) (Location of Primary Source of Pain)   4. Primary osteoarthritis of both knees     Pre-procedure Pain Score: 7 Reported level of pain is compatible with clinical observations Post-procedure Pain Score: 5   Type: Local Anesthesia Local Anesthetic: Lidocaine 1% Route: Infiltration ([place]/IM) IV Access: Declined Sedation: Declined  Indication(s): Analgesia     Pre-Procedure Assessment  Karen Lewis is a 45 y.o. year old, female patient, seen today for interventional treatment. She has History of asthma; Binge eating disorder; Fibromyalgia; Anxiety, generalized; Insomnia, persistent; Depression, major, recurrent, moderate ([place]); Airway hyperreactivity; Dermatitis, eczematoid; Gout; Hyperlipidemia; Headache, migraine; Obstructive apnea; Bilateral polycystic ovarian syndrome; Apnea, sleep; Disease of thyroid gland; Avitaminosis D; History of surgical procedure; Type 2 diabetes mellitus ([place]); Chronic pain; Long term current use of opiate analgesic; Long term prescription opiate use; Opiate use; Encounter for therapeutic drug level monitoring; Opiate dependence ([place]); Essential (primary) hypertension; Goiter, nontoxic, multinodular; Chronic knee  pain (Bilateral) (Location of Primary Source of Pain); Osteoarthritis of knee (Bilateral); Chronic low back pain (R>L); Lumbar facet syndrome (Bilateral) (R>L); Osteoarthrosis; Grade 1 (1.4 cm) Anterolisthesis of L4 over L5; Chronic hip pain (Bilateral) (Left total hip replacement); History of total left hip replacement; History of methicillin resistant staphylococcus aureus (MRSA); History of bariatric surgery; Hypomagnesemia; Morbid obesity with BMI of 70 and over, adult ([place]) (71.75 on 08/27/2015); History of methicillin resistant Staphylococcus aureus infection; History of artificial joint; Lumbar spondylosis; Vitamin D insufficiency; Dysphagia; Gastroesophageal reflux disease without esophagitis; and Osteoarthritis of hip (Right) on her problem list.. Her primarily concern today is the Hip Pain (right)   Pain Type: Chronic pain Pain Location: Hip Pain Orientation: Right Pain Descriptors / Indicators: Sharp, Burning (searing, cutting) Pain Frequency: Constant  Date of Last Visit: 05/10/16 Service Provided on Last Visit: Med Refill  Coagulation Parameters Lab Results  Component Value Date   INR 0.95 05/07/2015   LABPROT 12.9 05/07/2015   APTT 26 05/07/2015   PLT 337 11/12/2015    Verification of the correct person, correct site (including marking of site), and correct procedure were performed and confirmed by the patient.  Consent: Secured. Under the influence of no sedatives a written informed consent was obtained, after having provided information on the risks and possible complications. To fulfill our ethical and legal obligations, as recommended by the American Medical Association's Code of Ethics, we have provided information to the patient about our clinical impression; the nature and purpose of the treatment or procedure; the risks, benefits, and possible complications of the intervention; alternatives; the risk(s) and benefit(s) of the alternative treatment(s) or procedure(s); and  the risk(s) and benefit(s) of doing nothing. The patient was provided information about the risks and possible complications associated with the procedure. These include, but are not limited to, failure to achieve desired goals, infection, bleeding,  organ or nerve damage, allergic reactions, paralysis, and death. In the case of intra- or periarticular procedures these may include, but are not limited to, failure to achieve desired goals, infection, bleeding (hemarthrosis), organ or nerve damage, allergic reactions, and death. In addition, the patient was informed that Medicine is not an exact science; therefore, there is also the possibility of unforeseen risks and possible complications that may result in a catastrophic outcome. The patient indicated having understood very clearly. We have given the patient no guarantees and we have made no promises. Enough time was given to the patient to ask questions, all of which were answered to the patient's satisfaction.  Consent Attestation: I, the ordering provider, attest that I have discussed with the patient the benefits, risks, side-effects, alternatives, likelihood of achieving goals, and potential problems during recovery for the procedure that I have provided informed consent.  Pre-Procedure Preparation: Safety Precautions: Allergies reviewed. Appropriate site, procedure, and patient were confirmed by following the Joint Commission's Universal Protocol (UP.01.01.01), in the form of a "Time Out". The patient was asked to confirm marked site and procedure, before commencing. The patient was asked about blood thinners, or active infections, both of which were denied. Patient was assessed for positional comfort and all pressure points were checked before starting procedure. Allergies: She is allergic to xolair [omalizumab]; bactrim [sulfamethoxazole-trimethoprim]; aspirin; ciprofloxacin; clindamycin; motrin [ibuprofen]; sulfa antibiotics; and nsaids.. Infection  Control Precautions: Sterile technique used. Standard Universal Precautions were taken as recommended by the Department of [facility] for Disease Control and Prevention (CDC). Standard pre-surgical skin prep was conducted. Respiratory hygiene and cough etiquette was practiced. Hand hygiene observed. Safe injection practices and needle disposal techniques followed. SDV (single dose vial) medications used. Medications properly checked for expiration dates and contaminants. Personal protective equipment (PPE) used: Sterile Radiation-resistant gloves. Monitoring:  As per clinic protocol. Vitals:   05/25/16 1400 05/25/16 1500 05/25/16 1505 05/25/16 1508  BP: (!) 121/96 (!) 116/94 (!) 134/103 (!) 131/53  Pulse: 99 (!) 104 (!) 112 (!) 108  Resp: 18 20 (!) 30 (!) 21  Temp:      TempSrc:      SpO2: 99% 96% 96% 92%  Weight:      Height:      Calculated BMI: Body mass index is 70.89 kg/m.  Description of Procedure Process:   Time-out: "Time-out" completed before starting procedure, as per protocol. Position: Supine Target Area: Superior aspect of the hip joint cavity, going thru the superior portion of the capsular ligament. Approach: Anterolateral approach. Area Prepped: Entire Anterolateral hip area. Prepping solution: ChloraPrep (2% chlorhexidine gluconate and 70% isopropyl alcohol) Safety Precautions: Aspiration looking for blood return was conducted prior to all injections. At no point did we inject any substances, as a needle was being advanced. No attempts were made at seeking any paresthesias. Safe injection practices and needle disposal techniques used. Medications properly checked for expiration dates. SDV (single dose vial) medications used.   Description of the Procedure: Protocol guidelines were followed. The patient was placed in position over the fluoroscopy table. The target area was identified and the area prepped in the usual manner. Skin & deeper tissues infiltrated with  local anesthetic. Appropriate amount of time allowed to pass for local anesthetics to take effect. The procedure needles were then advanced to the target area. Proper needle placement secured. Negative aspiration confirmed. Solution injected in intermittent fashion, asking for systemic symptoms every 0.5cc of injectate. The needles were then removed and the area cleansed, making  sure to leave some of the prepping solution back to take advantage of its long term bactericidal properties. This procedure was technically challenging due to the patient's morbid obesity. We required the assistance of 2 nurses to hold her panniculus back so that we could perform the procedure. EBL: Minimal Materials & Medications Used:  Needle(s) Used: 22g - 7" Spinal Needle(s) Solution Injected: 0.2% PF-Ropivacaine (40m) + SDV-DepoMedrol 80 mg/ml (183m Medications Administered today: We administered methylPREDNISolone acetate, lidocaine (PF), ropivacaine (PF) 2 mg/ml (0.2%), and iopamidol.Please see chart orders for dosing details.  Imaging Guidance:   Type of Imaging Technique: Fluoroscopy Guidance (Non-spinal) Indication(s): Assistance in needle guidance and placement for procedures requiring needle placement in or near specific anatomical locations not easily accessible without such assistance. Exposure Time: Please see nurses notes. Contrast: Before injecting any contrast, we confirmed that the patient did not have an allergy to iodine, shellfish, or radiological contrast. Once satisfactory needle placement was completed at the desired level, radiological contrast was injected. Injection was conducted under continuous fluoroscopic guidance. Injection of contrast accomplished without complications. See chart for type and volume of contrast used. Fluoroscopic Guidance: I was personally present in the fluoroscopy suite, where the patient was placed in position for the procedure, over the fluoroscopy-compatible table.  Fluoroscopy was manipulated, using "Tunnel Vision Technique", to obtain the best possible view of the target area, on the affected side. Parallax error was corrected before commencing the procedure. A "direction-depth-direction" technique was used to introduce the needle under continuous pulsed fluoroscopic guidance. Once the target was reached, antero-posterior, oblique, and lateral fluoroscopic projection views were taken to confirm needle placement in all planes. Permanently recorded images stored by scanning into EMR. Interpretation: Intraoperative imaging interpretation by performing Physician. Adequate needle placement confirmed in AP, Lateral, & Oblique Views. Appropriate spread of contrast to desired area. No evidence of afferent or efferent intravascular uptake. Permanent images scanned into the patient's record.  Antibiotic Prophylaxis:  Indication(s): No indications identified. Type:  Antibiotics Given (last 72 hours)    None       Post-operative Assessment:   Complications: No immediate post-treatment complications were observed. Disposition: The patient was discharged home, once institutional criteria were met. Return to clinic in 2 weeks for follow-up evaluation and interpretation of results. The patient tolerated the entire procedure well. A repeat set of vitals were taken after the procedure and the patient was kept under observation following institutional policy, for this type of procedure. Post-procedural neurological assessment was performed, showing return to baseline, prior to discharge. The patient was provided with post-procedure discharge instructions, including a section on how to identify potential problems. Should any problems arise concerning this procedure, the patient was given instructions to immediately contact [place] any time, without hesitation. In any case, we plan to contact the patient by telephone for a follow-up status report regarding this interventional  procedure. Comments:  No additional relevant information.  Plan of Care   Problem List Items Addressed This Visit      High   Chronic hip pain (Bilateral) (Left total hip replacement) - Primary (Chronic)   Relevant Medications   methylPREDNISolone acetate (DEPO-MEDROL) injection 80 mg (Completed)   lidocaine (PF) (XYLOCAINE) 1 % injection 10 mL (Completed)   ropivacaine (PF) 2 mg/ml (0.2%) (NAROPIN) epidural 9 mL (Completed)   Chronic knee pain (Bilateral) (Location of Primary Source of Pain) (Chronic)   Relevant Medications   methylPREDNISolone acetate (DEPO-MEDROL) injection 80 mg (Completed)   lidocaine (PF) (XYLOCAINE) 1 % injection 10  mL (Completed)   ropivacaine (PF) 2 mg/ml (0.2%) (NAROPIN) epidural 9 mL (Completed)   Other Relevant Orders   KNEE INJECTION   Osteoarthritis of knee (Bilateral) (Chronic)   Relevant Medications   methylPREDNISolone acetate (DEPO-MEDROL) injection 80 mg (Completed)   Other Relevant Orders   KNEE INJECTION   Osteoarthrosis (Chronic)   Relevant Medications   methylPREDNISolone acetate (DEPO-MEDROL) injection 80 mg (Completed)   lidocaine (PF) (XYLOCAINE) 1 % injection 10 mL (Completed)   ropivacaine (PF) 2 mg/ml (0.2%) (NAROPIN) epidural 9 mL (Completed)   iopamidol (ISOVUE-M) 41 % intrathecal injection 10 mL (Completed)   Other Relevant Orders   DG C-Arm 1-60 Min-No Report (Completed)   KNEE INJECTION    Other Visit Diagnoses   None.     Requested PM Follow-up: Return in about 2 weeks (around 06/08/2016) for Post-Procedure evaluation, In addition, Schedule Procedure.  Future Appointments Date Time Provider [place]  06/15/2016 10:15 AM Milinda Pointer, MD [facility] None  06/29/2016 12:30 PM Milinda Pointer, MD [facility] None  07/01/2016 1:30 PM Himabindu Einar Grad, MD [facility] None  09/01/2016 11:20 AM Milinda Pointer, MD [facility] None    Primary Care Physician: Idelle Crouch, MD Location: [facility] Note by: Kathlen Brunswick. [name], M.D, DABA, DABAPM, DABPM, DABIPP, FIPP  Disclaimer:  Medicine is not an exact science. The only guarantee in medicine is that nothing is guaranteed. It is important to note that the decision to proceed with this intervention was based on the information collected from the patient. The Data and conclusions were drawn from the patient's questionnaire, the interview, and the physical examination. Because the information was provided in large part by the patient, it cannot be guaranteed that it has not been purposely or unconsciously manipulated. Every effort has been made to obtain as much relevant data as possible for this evaluation. It is important to note that the conclusions that lead to this procedure are derived in large part from the available data. Always take into account that the treatment will also be dependent on availability of resources and existing treatment guidelines, considered by other Pain Management Practitioners as being common knowledge and practice, at the time of the intervention. For Medico-Legal purposes, it is also important to point out that variation in procedural techniques and pharmacological choices are the acceptable norm. The indications, contraindications, technique, and results of the above procedure should only be interpreted and judged by a Board-Certified Interventional Pain Specialist with extensive familiarity and expertise in the same exact procedure and technique. Attempts at providing opinions without similar or greater experience and expertise than that of the treating physician will be considered as inappropriate and unethical, and shall result in a formal complaint to the state medical board and applicable specialty societies.

## 2016-05-26 ENCOUNTER — Telehealth: Payer: Self-pay | Admitting: *Deleted

## 2016-05-26 NOTE — Telephone Encounter (Signed)
Message left

## 2016-06-01 ENCOUNTER — Ambulatory Visit: Payer: Commercial Managed Care - HMO | Admitting: Pain Medicine

## 2016-06-03 DIAGNOSIS — I10 Essential (primary) hypertension: Secondary | ICD-10-CM | POA: Diagnosis not present

## 2016-06-03 DIAGNOSIS — I252 Old myocardial infarction: Secondary | ICD-10-CM | POA: Diagnosis not present

## 2016-06-03 DIAGNOSIS — E039 Hypothyroidism, unspecified: Secondary | ICD-10-CM | POA: Diagnosis not present

## 2016-06-03 DIAGNOSIS — Z9884 Bariatric surgery status: Secondary | ICD-10-CM | POA: Diagnosis not present

## 2016-06-03 DIAGNOSIS — K219 Gastro-esophageal reflux disease without esophagitis: Secondary | ICD-10-CM | POA: Diagnosis not present

## 2016-06-03 DIAGNOSIS — K2951 Unspecified chronic gastritis with bleeding: Secondary | ICD-10-CM | POA: Diagnosis not present

## 2016-06-03 DIAGNOSIS — G4730 Sleep apnea, unspecified: Secondary | ICD-10-CM | POA: Diagnosis not present

## 2016-06-03 DIAGNOSIS — Z8249 Family history of ischemic heart disease and other diseases of the circulatory system: Secondary | ICD-10-CM | POA: Diagnosis not present

## 2016-06-03 DIAGNOSIS — K317 Polyp of stomach and duodenum: Secondary | ICD-10-CM | POA: Diagnosis not present

## 2016-06-03 DIAGNOSIS — J45909 Unspecified asthma, uncomplicated: Secondary | ICD-10-CM | POA: Diagnosis not present

## 2016-06-03 DIAGNOSIS — K449 Diaphragmatic hernia without obstruction or gangrene: Secondary | ICD-10-CM | POA: Diagnosis not present

## 2016-06-03 DIAGNOSIS — Z6841 Body Mass Index (BMI) 40.0 and over, adult: Secondary | ICD-10-CM | POA: Diagnosis not present

## 2016-06-03 DIAGNOSIS — K2950 Unspecified chronic gastritis without bleeding: Secondary | ICD-10-CM | POA: Diagnosis not present

## 2016-06-03 DIAGNOSIS — E119 Type 2 diabetes mellitus without complications: Secondary | ICD-10-CM | POA: Diagnosis not present

## 2016-06-15 ENCOUNTER — Ambulatory Visit (HOSPITAL_BASED_OUTPATIENT_CLINIC_OR_DEPARTMENT_OTHER): Payer: Commercial Managed Care - HMO | Admitting: Pain Medicine

## 2016-06-15 ENCOUNTER — Ambulatory Visit
Admission: RE | Admit: 2016-06-15 | Discharge: 2016-06-15 | Disposition: A | Discharge status: Home / Self Care | Payer: Commercial Managed Care - HMO | Source: Ambulatory Visit | Attending: Pain Medicine | Admitting: Pain Medicine

## 2016-06-15 ENCOUNTER — Encounter: Payer: Self-pay | Admitting: Pain Medicine

## 2016-06-15 VITALS — BP 126/86 | HR 90 | Temp 98.9°F | Resp 17 | Ht 64.0 in | Wt >= 6400 oz

## 2016-06-15 DIAGNOSIS — Z9049 Acquired absence of other specified parts of digestive tract: Secondary | ICD-10-CM | POA: Insufficient documentation

## 2016-06-15 DIAGNOSIS — G8929 Other chronic pain: Secondary | ICD-10-CM

## 2016-06-15 DIAGNOSIS — M170 Bilateral primary osteoarthritis of knee: Secondary | ICD-10-CM | POA: Diagnosis not present

## 2016-06-15 DIAGNOSIS — M25561 Pain in right knee: Secondary | ICD-10-CM | POA: Diagnosis not present

## 2016-06-15 DIAGNOSIS — Z881 Allergy status to other antibiotic agents status: Secondary | ICD-10-CM | POA: Insufficient documentation

## 2016-06-15 DIAGNOSIS — Z903 Acquired absence of stomach [part of]: Secondary | ICD-10-CM | POA: Insufficient documentation

## 2016-06-15 DIAGNOSIS — Z886 Allergy status to analgesic agent status: Secondary | ICD-10-CM | POA: Insufficient documentation

## 2016-06-15 DIAGNOSIS — Z882 Allergy status to sulfonamides status: Secondary | ICD-10-CM | POA: Insufficient documentation

## 2016-06-15 DIAGNOSIS — M25562 Pain in left knee: Secondary | ICD-10-CM

## 2016-06-15 MED ORDER — ROPIVACAINE HCL 2 MG/ML IJ SOLN
9.0000 mL | Freq: Once | INTRAMUSCULAR | Status: DC
  Filled 2016-06-15: qty 10

## 2016-06-15 MED ORDER — SODIUM HYALURONATE (VISCOSUP) 20 MG/2ML IX SOSY
2.0000 mL | PREFILLED_SYRINGE | Freq: Once | INTRA_ARTICULAR | Status: DC
  Filled 2016-06-15: qty 2

## 2016-06-15 MED ORDER — LIDOCAINE HCL (PF) 1 % IJ SOLN
10.0000 mL | Freq: Once | INTRAMUSCULAR | Status: DC
  Filled 2016-06-15: qty 10

## 2016-06-15 NOTE — Progress Notes (Signed)
Safety precautions to be maintained throughout the outpatient stay will include: orient to surroundings, keep bed in low position, maintain call bell within reach at all times, provide assistance with transfer out of bed and ambulation.  

## 2016-06-15 NOTE — Patient Instructions (Signed)
Pain Management Discharge Instructions  General Discharge Instructions :  If you need to reach your doctor call: Monday-Friday 8:00 am - 4:00 pm at [phone] or toll free [phone].  After clinic hours [phone] to have operator reach doctor.  Bring all of your medication bottles to all your appointments in the pain clinic.  To cancel or reschedule your appointment with Pain Management please remember to call 24 hours in advance to avoid a fee.  Refer to the educational materials which you have been given on: General Risks, I had my Procedure. Discharge Instructions, Post Sedation.  Post Procedure Instructions:  The drugs you were given will stay in your system until tomorrow, so for the next 24 hours you should not drive, make any legal decisions or drink any alcoholic beverages.  You may eat anything you prefer, but it is better to start with liquids then soups and crackers, and gradually work up to solid foods.  Please notify your doctor immediately if you have any unusual bleeding, trouble breathing or pain that is not related to your normal pain.  Depending on the type of procedure that was done, some parts of your body may feel week and/or numb.  This usually clears up by tonight or the next day.  Walk with the use of an assistive device or accompanied by an adult for the 24 hours.  You may use ice on the affected area for the first 24 hours.  Put ice in a Ziploc bag and cover with a towel and place against area 15 minutes on 15 minutes off.  You may switch to heat after 24 hours.Knee Injection A knee injection is a procedure to get medicine into your knee joint. Your health care provider puts a needle into the joint and injects medicine with an attached syringe. The injected medicine may relieve the pain, swelling, and stiffness of arthritis. The injected medicine may also help to lubricate and cushion your knee joint. You may need more than one injection. LET YOUR HEALTH  CARE PROVIDER KNOW ABOUT:  Any allergies you have.  All medicines you are taking, including vitamins, herbs, eye drops, creams, and over-the-counter medicines.  Previous problems you or members of your family have had with the use of anesthetics.  Any blood disorders you have.  Previous surgeries you have had.  Any medical conditions you may have. RISKS AND COMPLICATIONS Generally, this is a safe procedure. However, problems may occur, including:  Infection.  Bleeding.  Worsening symptoms.  Damage to the area around your knee.  Allergic reaction to any of the medicines.  Skin reactions from repeated injections. BEFORE THE PROCEDURE  Ask your health care provider about changing or stopping your regular medicines. This is especially important if you are taking diabetes medicines or blood thinners.  Plan to have someone take you home after the procedure. PROCEDURE  You will sit or lie down in a position for your knee to be treated.  The skin over your kneecap will be cleaned with a germ-killing solution (antiseptic).  You will be given a medicine that numbs the area (local anesthetic). You may feel some stinging.  After your knee becomes numb, you will have a second injection. This is the medicine. This needle is carefully placed between your kneecap and your knee. The medicine is injected into the joint space.  At the end of the procedure, the needle will be removed.  A bandage (dressing) may be placed over the injection site. The procedure may vary among   health care providers and hospitals. AFTER THE PROCEDURE  You may have to move your knee through its full range of motion. This helps to get all of the medicine into your joint space.  Your blood pressure, heart rate, breathing rate, and blood oxygen level will be monitored often until the medicines you were given have worn off.  You will be watched to make sure that you do not have a reaction to the injected  medicine.   This information is not intended to replace advice given to you by your health care provider. Make sure you discuss any questions you have with your health care provider.   Document Released: 11/21/2006 Document Revised: 09/20/2014 Document Reviewed: 07/10/2014 Elsevier Interactive Patient Education [employer].

## 2016-06-15 NOTE — Progress Notes (Addendum)
Patient's Name: Karen Lewis  MRN: [id number]  Referring Provider: Milinda Pointer, MD  DOB: 04/19/1971  PCP: Karen Crouch, MD  DOS: 06/15/2016  Note by: Karen Brunswick. [name], MD  Service setting: Ambulatory outpatient  Location: [facility] (AMB) Pain Management Facility  Visit type: Procedure  Specialty: Interventional Pain Management  Patient type: Established   Primary Reason for Visit: Interventional Pain Management Treatment. CC: Knee Pain (bilateral); Hip Pain (right); and Back Pain (mid to lower)  Procedure:  Anesthesia, Analgesia, Anxiolysis:  Type: Therapeutic Intra-Articular Knee Injection (Hyalgan #1) Region:  Knee Region Level: Knee Joint Laterality: Bilateral  Type: Local Anesthesia Local Anesthetic: Lidocaine 1% Route: Infiltration ([place]/IM) IV Access: Declined Sedation: Declined  Indication(s): Analgesia        Indications: 1. Primary osteoarthritis of both knees   2. Chronic knee pain (Bilateral) (Location of Primary Source of Pain)    Pain Score: Pre-procedure: 4 /10 Post-procedure: 4 /10  Pre-Procedure Assessment:  Karen Lewis is a 45 y.o. (year old), female patient, seen today for interventional treatment. She  has a past surgical history that includes Laparoscopic partial gastrectomy; Shoulder arthroscopy (Right); Joint replacement (Left, hip); Carpal tunnel release (Bilateral); Diagnostic laparoscopy; Cholecystectomy; Trigger finger release (Right); and Thyroidectomy (N/A, 11/12/2015).. Her primarily concern today is the Knee Pain (bilateral); Hip Pain (right); and Back Pain (mid to lower) The primary encounter diagnosis was Primary osteoarthritis of both knees. A diagnosis of Chronic knee pain (Bilateral) (Location of Primary Source of Pain) was also pertinent to this visit.  Pain Type: Chronic pain Pain Location: Knee Pain Orientation: Right, Left (right is worse than left) Pain Descriptors / Indicators: Aching, Tightness, Sharp Pain Frequency: Constant  Date of  Last Visit: 05/25/16 Service Provided on Last Visit: Procedure (right hip)  Coagulation Parameters Lab Results  Component Value Date   INR 0.95 05/07/2015   LABPROT 12.9 05/07/2015   APTT 26 05/07/2015   PLT 337 11/12/2015   Verification of the correct person, correct site (including marking of site), and correct procedure were performed and confirmed by the patient.  Consent: Before the procedure and under the influence of no sedative(s), amnesic(s), or anxiolytics, the patient was informed of the treatment options, risks and possible complications. To fulfill our ethical and legal obligations, as recommended by the American Medical Association's Code of Ethics, I have informed the patient of my clinical impression; the nature and purpose of the treatment or procedure; the risks, benefits, and possible complications of the intervention; the alternatives, including doing nothing; the risk(s) and benefit(s) of the alternative treatment(s) or procedure(s); and the risk(s) and benefit(s) of doing nothing. The patient was provided information about the general risks and possible complications associated with the procedure. These may include, but are not limited to: failure to achieve desired goals, infection, bleeding, organ or nerve damage, allergic reactions, paralysis, and death. In addition, the patient was informed of those risks and complications associated to Spine-related procedures, such as failure to decrease pain; infection (i.e.: Meningitis, epidural or intraspinal abscess); bleeding (i.e.: epidural hematoma, subarachnoid hemorrhage, or any other type of intraspinal or peri-dural bleeding); organ or nerve damage (i.e.: Any type of peripheral nerve, nerve root, or spinal cord injury) with subsequent damage to sensory, motor, and/or autonomic systems, resulting in permanent pain, numbness, and/or weakness of one or several areas of the body; allergic reactions; (i.e.: anaphylactic reaction);  and/or death. Furthermore, the patient was informed of those risks and complications associated with the medications. These include, but are not limited to:  allergic reactions (i.e.: anaphylactic or anaphylactoid reaction(s)); adrenal axis suppression; blood sugar elevation that in diabetics may result in ketoacidosis or comma; water retention that in patients with history of congestive heart failure may result in shortness of breath, pulmonary edema, and decompensation with resultant heart failure; weight gain; swelling or edema; medication-induced neural toxicity; particulate matter embolism and blood vessel occlusion with resultant organ, and/or nervous system infarction; and/or aseptic necrosis of one or more joints. Finally, the patient was informed that Medicine is not an exact science; therefore, there is also the possibility of unforeseen or unpredictable risks and/or possible complications that may result in a catastrophic outcome. The patient indicated having understood very clearly. We have given the patient no guarantees and we have made no promises. Enough time was given to the patient to ask questions, all of which were answered to the patient's satisfaction. Karen Lewis has indicated that she wanted to continue with the procedure.  Consent Attestation: I, the ordering provider, attest that I have discussed with the patient the benefits, risks, side-effects, alternatives, likelihood of achieving goals, and potential problems during recovery for the procedure that I have provided informed consent.  Pre-Procedure Preparation:  Safety Precautions: Allergies reviewed. The patient was asked about blood thinners, or active infections, both of which were denied. The patient was asked to confirm the procedure and laterality, before marking the site, and again before commencing the procedure. Appropriate site, procedure, and patient were confirmed by following the Joint Commission's Universal Protocol  (UP.01.01.01), in the form of a "Time Out". The patient was asked to participate by confirming the accuracy of the "Time Out" information. Patient was assessed for positional comfort and pressure points before starting the procedure. Allergies: She is allergic to bactrim [sulfamethoxazole-trimethoprim]; xolair [omalizumab]; aspirin; ciprofloxacin; motrin [ibuprofen]; sulfa antibiotics; clindamycin; and nsaids.. Allergy Precautions: None required Infection Control Precautions: Sterile technique used. Standard Universal Precautions were taken as recommended by the Department of [facility] for Disease Control and Prevention (CDC). Standard pre-surgical skin prep was conducted. Respiratory hygiene and cough etiquette was practiced. Hand hygiene observed. Safe injection practices and needle disposal techniques followed. SDV (single dose vial) medications used. Medications properly checked for expiration dates and contaminants. Personal protective equipment (PPE) used as per protocol. Monitoring:  As per clinic protocol. Vitals:   06/15/16 1016 06/15/16 1151 06/15/16 1201 06/15/16 1211  BP: 118/85 (!) 142/87 120/85 126/86  Pulse:  89 94 90  Resp:  (!) '21 19 17  ' Temp:      SpO2:  92% 100% 96%  Weight:      Height:      Calculated BMI: Body mass index is 71.92 kg/m. Time-out: "Time-out" completed before starting procedure, as per protocol.  Description of Procedure Process:   Time-out: "Time-out" completed before starting procedure, as per protocol. Position: Supine Target Area: Knee Joint Approach: Lateral approach. Area Prepped: Entire knee area, from the mid-thigh to the mid-shin. Prepping solution: ChloraPrep (2% chlorhexidine gluconate and 70% isopropyl alcohol) Safety Precautions: Aspiration looking for blood return was conducted prior to all injections. At no point did we inject any substances, as a needle was being advanced. No attempts were made at seeking any paresthesias. Safe  injection practices and needle disposal techniques used. Medications properly checked for expiration dates. SDV (single dose vial) medications used.    Description of the Procedure: Protocol guidelines were followed. The patient was placed in position over the fluoroscopy table. The target area was identified and the area prepped in the  usual manner. Skin desensitized using vapocoolant spray. Skin & deeper tissues infiltrated with local anesthetic. Appropriate amount of time allowed to pass for local anesthetics to take effect. The procedure needles were then advanced to the target area. Proper needle placement secured. Negative aspiration confirmed. Solution injected in intermittent fashion, asking for systemic symptoms every 0.5cc of injectate. The needles were then removed and the area cleansed, making sure to leave some of the prepping solution back to take advantage of its long term bactericidal properties. EBL: None Materials & Medications Used:  Needle(s) Used: 22g - 1.5" Needle(s) Medications Administered today: Ms. Exline had no medications administered during this visit.Please see chart orders for dosing details.  Imaging Guidance:   Type of Imaging Technique: None Indication(s): Assistance in needle guidance and placement for procedures requiring needle placement in or near specific anatomical locations not easily accessible without such assistance. Exposure Time: Please see nurses notes. Contrast: None used. Fluoroscopic Guidance: I was personally present in the fluoroscopy suite, where the patient was placed in position for the procedure, over the fluoroscopy-compatible table. Fluoroscopy was manipulated, using "Tunnel Vision Technique", to obtain the best possible view of the target area, on the affected side. Parallax error was corrected before commencing the procedure. A "direction-depth-direction" technique was used to introduce the needle under continuous pulsed fluoroscopic guidance.  Once the target was reached, antero-posterior, oblique, and lateral fluoroscopic projection views were taken to confirm needle placement in all planes. Permanently recorded images stored by scanning into EMR. Interpretation: Intraoperative imaging interpretation by performing Physician. Adequate needle placement confirmed. No contrast injected. Permanent hardcopy images in multiple planes scanned into the patient's record.  Antibiotic Prophylaxis:  Indication(s): No indications identified. Type:  Antibiotics Given (last 72 hours)    None       Post-operative Assessment:   Complications: No immediate post-treatment complications were observed. Disposition: Return to clinic for follow-up evaluation. The patient tolerated the entire procedure well. A repeat set of vitals were taken after the procedure and the patient was kept under observation following institutional policy, for this type of procedure. The patient was discharged home, once institutional criteria were met. The patient was provided with post-procedure discharge instructions, including a section on how to identify potential problems. Should any problems arise concerning this procedure, the patient was given instructions to immediately contact us, at any time, without hesitation. In any case, we plan to contact the patient by telephone for a follow-up status report regarding this interventional procedure. Comments:  No additional relevant information.  Plan of Care   Problem List Items Addressed This Visit      High   Chronic knee pain (Bilateral) (Location of Primary Source of Pain) (Chronic)   Relevant Medications   Sodium Hyaluronate SOSY 2 mL   lidocaine (PF) (XYLOCAINE) 1 % injection 10 mL   ropivacaine (PF) 2 mg/ml (0.2%) (NAROPIN) epidural 9 mL   Sodium Hyaluronate SOSY 2 mL   Osteoarthritis of knee (Bilateral) - Primary (Chronic)   Relevant Medications   Sodium Hyaluronate SOSY 2 mL   lidocaine (PF) (XYLOCAINE) 1 %  injection 10 mL   ropivacaine (PF) 2 mg/ml (0.2%) (NAROPIN) epidural 9 mL   Sodium Hyaluronate SOSY 2 mL   Other Relevant Orders   DG C-Arm 1-60 Min-No Report (Completed)   KNEE INJECTION    Other Visit Diagnoses   None.     Requested PM Follow-up: Return in about 2 weeks (around 06/29/2016) for Schedule Procedure.  Future Appointments Date Time Provider Department  Center  07/01/2016 1:30 PM Himabindu Einar Grad, MD [facility] None  07/27/2016 8:15 AM Karen Pointer, MD [facility] None    Primary Care Physician: Karen Crouch, MD Location: [facility] Note by: Karen Brunswick. Dossie Lewis, M.D, DABA, DABAPM, DABPM, DABIPP, FIPP  Disclaimer:  Medicine is not an exact science. The only guarantee in medicine is that nothing is guaranteed. It is important to note that the decision to proceed with this intervention was based on the information collected from the patient. The Data and conclusions were drawn from the patient's questionnaire, the interview, and the physical examination. Because the information was provided in large part by the patient, it cannot be guaranteed that it has not been purposely or unconsciously manipulated. Every effort has been made to obtain as much relevant data as possible for this evaluation. It is important to note that the conclusions that lead to this procedure are derived in large part from the available data. Always take into account that the treatment will also be dependent on availability of resources and existing treatment guidelines, considered by other Pain Management Practitioners as being common knowledge and practice, at the time of the intervention. For Medico-Legal purposes, it is also important to point out that variation in procedural techniques and pharmacological choices are the acceptable norm. The indications, contraindications, technique, and results of the above procedure should only be interpreted and judged by a  Board-Certified Interventional Pain Specialist with extensive familiarity and expertise in the same exact procedure and technique. Attempts at providing opinions without similar or greater experience and expertise than that of the treating physician will be considered as inappropriate and unethical, and shall result in a formal complaint to the state medical board and applicable specialty societies. Daisyreviewed the middle ear

## 2016-06-16 ENCOUNTER — Telehealth: Payer: Self-pay | Admitting: *Deleted

## 2016-06-16 NOTE — Telephone Encounter (Signed)
Denies complications post procedure. 

## 2016-06-17 DIAGNOSIS — I10 Essential (primary) hypertension: Secondary | ICD-10-CM | POA: Diagnosis not present

## 2016-06-17 DIAGNOSIS — E7800 Pure hypercholesterolemia, unspecified: Secondary | ICD-10-CM | POA: Diagnosis not present

## 2016-06-17 DIAGNOSIS — E079 Disorder of thyroid, unspecified: Secondary | ICD-10-CM | POA: Diagnosis not present

## 2016-06-17 DIAGNOSIS — R51 Headache: Secondary | ICD-10-CM | POA: Diagnosis not present

## 2016-06-17 DIAGNOSIS — M25551 Pain in right hip: Secondary | ICD-10-CM | POA: Diagnosis not present

## 2016-06-17 DIAGNOSIS — F3341 Major depressive disorder, recurrent, in partial remission: Secondary | ICD-10-CM | POA: Diagnosis not present

## 2016-06-17 DIAGNOSIS — M79642 Pain in left hand: Secondary | ICD-10-CM | POA: Diagnosis not present

## 2016-06-17 DIAGNOSIS — E1142 Type 2 diabetes mellitus with diabetic polyneuropathy: Secondary | ICD-10-CM | POA: Diagnosis not present

## 2016-06-29 ENCOUNTER — Ambulatory Visit: Payer: Commercial Managed Care - HMO | Admitting: Pain Medicine

## 2016-06-30 DIAGNOSIS — E7800 Pure hypercholesterolemia, unspecified: Secondary | ICD-10-CM | POA: Diagnosis not present

## 2016-06-30 DIAGNOSIS — G4733 Obstructive sleep apnea (adult) (pediatric): Secondary | ICD-10-CM | POA: Diagnosis not present

## 2016-06-30 DIAGNOSIS — E119 Type 2 diabetes mellitus without complications: Secondary | ICD-10-CM | POA: Diagnosis not present

## 2016-06-30 DIAGNOSIS — K529 Noninfective gastroenteritis and colitis, unspecified: Secondary | ICD-10-CM | POA: Diagnosis not present

## 2016-07-01 ENCOUNTER — Ambulatory Visit: Payer: Commercial Managed Care - HMO | Admitting: Psychiatry

## 2016-07-06 ENCOUNTER — Ambulatory Visit
Admission: RE | Admit: 2016-07-06 | Discharge: 2016-07-06 | Disposition: A | Discharge status: Home / Self Care | Payer: Commercial Managed Care - HMO | Source: Ambulatory Visit | Attending: Pain Medicine | Admitting: Pain Medicine

## 2016-07-06 ENCOUNTER — Encounter: Payer: Self-pay | Admitting: Pain Medicine

## 2016-07-06 ENCOUNTER — Ambulatory Visit (HOSPITAL_BASED_OUTPATIENT_CLINIC_OR_DEPARTMENT_OTHER): Payer: Commercial Managed Care - HMO | Admitting: Pain Medicine

## 2016-07-06 VITALS — BP 103/89 | HR 86 | Temp 99.2°F | Resp 15 | Ht 64.0 in | Wt >= 6400 oz

## 2016-07-06 DIAGNOSIS — M170 Bilateral primary osteoarthritis of knee: Secondary | ICD-10-CM | POA: Diagnosis not present

## 2016-07-06 DIAGNOSIS — M25561 Pain in right knee: Secondary | ICD-10-CM

## 2016-07-06 DIAGNOSIS — M25562 Pain in left knee: Secondary | ICD-10-CM

## 2016-07-06 DIAGNOSIS — G8929 Other chronic pain: Secondary | ICD-10-CM

## 2016-07-06 MED ORDER — LIDOCAINE HCL (PF) 1 % IJ SOLN
10.0000 mL | Freq: Once | INTRAMUSCULAR | Status: AC
  Administered 2016-07-06: 10 mL

## 2016-07-06 MED ORDER — SODIUM HYALURONATE (VISCOSUP) 20 MG/2ML IX SOSY
2.0000 mL | PREFILLED_SYRINGE | Freq: Once | INTRA_ARTICULAR | Status: AC
  Administered 2016-07-06: 2 mL via INTRA_ARTICULAR
  Filled 2016-07-06: qty 2

## 2016-07-06 MED ORDER — ROPIVACAINE HCL 2 MG/ML IJ SOLN
9.0000 mL | Freq: Once | INTRAMUSCULAR | Status: AC
  Administered 2016-07-06: 9 mL
  Filled 2016-07-06: qty 10

## 2016-07-06 NOTE — Patient Instructions (Signed)
Knee Injection A knee injection is a procedure to get medicine into your knee joint. Your health care provider puts a needle into the joint and injects medicine with an attached syringe. The injected medicine may relieve the pain, swelling, and stiffness of arthritis. The injected medicine may also help to lubricate and cushion your knee joint. You may need more than one injection. LET [facility] CARE PROVIDER KNOW ABOUT:  Any allergies you have.  All medicines you are taking, including vitamins, herbs, eye drops, creams, and over-the-counter medicines.  Previous problems you or members of your family have had with the use of anesthetics.  Any blood disorders you have.  Previous surgeries you have had.  Any medical conditions you may have. RISKS AND COMPLICATIONS Generally, this is a safe procedure. However, problems may occur, including:  Infection.  Bleeding.  Worsening symptoms.  Damage to the area around your knee.  Allergic reaction to any of the medicines.  Skin reactions from repeated injections. BEFORE THE PROCEDURE  Ask your health care provider about changing or stopping your regular medicines. This is especially important if you are taking diabetes medicines or blood thinners.  Plan to have someone take you home after the procedure. PROCEDURE  You will sit or lie down in a position for your knee to be treated.  The skin over your kneecap will be cleaned with a germ-killing solution (antiseptic).  You will be given a medicine that numbs the area (local anesthetic). You may feel some stinging.  After your knee becomes numb, you will have a second injection. This is the medicine. This needle is carefully placed between your kneecap and your knee. The medicine is injected into the joint space.  At the end of the procedure, the needle will be removed.  A bandage (dressing) may be placed over the injection site. The procedure may vary among health care providers  and hospitals. AFTER THE PROCEDURE  You may have to move your knee through its full range of motion. This helps to get all of the medicine into your joint space.  Your blood pressure, heart rate, breathing rate, and blood oxygen level will be monitored often until the medicines you were given have worn off.  You will be watched to make sure that you do not have a reaction to the injected medicine.   This information is not intended to replace advice given to you by your health care provider. Make sure you discuss any questions you have with your health care provider.   Document Released: 11/21/2006 Document Revised: 09/20/2014 Document Reviewed: 07/10/2014 Elsevier Interactive Patient Education 2016 [employer] Pain Management Discharge Instructions  General Discharge Instructions :  If you need to reach your doctor call: Monday-Friday 8:00 am - 4:00 pm at [phone] or toll free [phone].  After clinic hours [phone] to have operator reach doctor.  Bring all of your medication bottles to all your appointments in the pain clinic.  To cancel or reschedule your appointment with Pain Management please remember to call 24 hours in advance to avoid a fee.  Refer to the educational materials which you have been given on: General Risks, I had my Procedure. Discharge Instructions, Post Sedation.  Post Procedure Instructions:  The drugs you were given will stay in your system until tomorrow, so for the next 24 hours you should not drive, make any legal decisions or drink any alcoholic beverages.  You may eat anything you prefer, but it is better to start with liquids then  soups and crackers, and gradually work up to solid foods.  Please notify your doctor immediately if you have any unusual bleeding, trouble breathing or pain that is not related to your normal pain.  Depending on the type of procedure that was done, some parts of your body may feel week and/or numb.  This  usually clears up by tonight or the next day.  Walk with the use of an assistive device or accompanied by an adult for the 24 hours.  You may use ice on the affected area for the first 24 hours.  Put ice in a Ziploc bag and cover with a towel and place against area 15 minutes on 15 minutes off.  You may switch to heat after 24 hours.

## 2016-07-06 NOTE — Progress Notes (Signed)
Safety precautions to be maintained throughout the outpatient stay will include: orient to surroundings, keep bed in low position, maintain call bell within reach at all times, provide assistance with transfer out of bed and ambulation.  

## 2016-07-06 NOTE — Progress Notes (Addendum)
Patient's Name: Karen Lewis  MRN: [id number]  Referring Provider: Milinda Pointer, MD  DOB: February 17, 1971  PCP: Idelle Crouch, MD  DOS: 07/06/2016  Note by: Kathlen Brunswick. [name], MD  Service setting: Ambulatory outpatient  Location: [facility] (AMB) Pain Management Facility  Visit type: Procedure  Specialty: Interventional Pain Management  Patient type: Established   Primary Reason for Visit: Interventional Pain Management Treatment. CC: Knee Pain (bilaterally) and Back Pain (lower)  Procedure:  Anesthesia, Analgesia, Anxiolysis:  Type: Palliative Intra-Articular Hyalgan #2 Knee Injection Region: Lateral infrapatellar Knee Region Level: Knee Joint Laterality: Bilateral  Type: Local Anesthesia Local Anesthetic: Lidocaine 1% Route: Infiltration ([place]/IM) IV Access: Declined Sedation: Declined  Indication(s): Analgesia          Indications: 1. Primary osteoarthritis of both knees   2. Chronic knee pain (Bilateral) (Location of Primary Source of Pain)    Pain Score: Pre-procedure: 5 /10 Post-procedure: 1 /10  Pre-Procedure Assessment:  Karen Lewis is a 45 y.o. (year old), female patient, seen today for interventional treatment. She  has a past surgical history that includes Laparoscopic partial gastrectomy; Shoulder arthroscopy (Right); Joint replacement (Left, hip); Carpal tunnel release (Bilateral); Diagnostic laparoscopy; Cholecystectomy; Trigger finger release (Right); and Thyroidectomy (N/A, 11/12/2015).. Her primarily concern today is the Knee Pain (bilaterally) and Back Pain (lower) The primary encounter diagnosis was Primary osteoarthritis of both knees. A diagnosis of Chronic knee pain (Bilateral) (Location of Primary Source of Pain) was also pertinent to this visit.  Pain Type: Chronic pain Pain Orientation: Right, Left Pain Descriptors / Indicators: Aching, Constant, Numbness, Discomfort Pain Frequency: Constant  Date of Last Visit: 06/15/16 Service Provided on Last Visit:  Procedure  Coagulation Parameters Lab Results  Component Value Date   INR 0.95 05/07/2015   LABPROT 12.9 05/07/2015   APTT 26 05/07/2015   PLT 337 11/12/2015   Verification of the correct person, correct site (including marking of site), and correct procedure were performed and confirmed by the patient.  Consent: Before the procedure and under the influence of no sedative(s), amnesic(s), or anxiolytics, the patient was informed of the treatment options, risks and possible complications. To fulfill our ethical and legal obligations, as recommended by the American Medical Association's Code of Ethics, I have informed the patient of my clinical impression; the nature and purpose of the treatment or procedure; the risks, benefits, and possible complications of the intervention; the alternatives, including doing nothing; the risk(s) and benefit(s) of the alternative treatment(s) or procedure(s); and the risk(s) and benefit(s) of doing nothing. The patient was provided information about the general risks and possible complications associated with the procedure. These may include, but are not limited to: failure to achieve desired goals, infection, bleeding, organ or nerve damage, allergic reactions, paralysis, and death. In addition, the patient was informed of those risks and complications associated to the procedure, such as failure to decrease pain; infection; bleeding; organ or nerve damage with subsequent damage to sensory, motor, and/or autonomic systems, resulting in permanent pain, numbness, and/or weakness of one or several areas of the body; allergic reactions; (i.e.: anaphylactic reaction); and/or death. Furthermore, the patient was informed of those risks and complications associated with the medications. These include, but are not limited to: allergic reactions (i.e.: anaphylactic or anaphylactoid reaction(s)); adrenal axis suppression; blood sugar elevation that in diabetics may result in  ketoacidosis or comma; water retention that in patients with history of congestive heart failure may result in shortness of breath, pulmonary edema, and decompensation with resultant heart failure; weight  gain; swelling or edema; medication-induced neural toxicity; particulate matter embolism and blood vessel occlusion with resultant organ, and/or nervous system infarction; and/or aseptic necrosis of one or more joints. Finally, the patient was informed that Medicine is not an exact science; therefore, there is also the possibility of unforeseen or unpredictable risks and/or possible complications that may result in a catastrophic outcome. The patient indicated having understood very clearly. We have given the patient no guarantees and we have made no promises. Enough time was given to the patient to ask questions, all of which were answered to the patient's satisfaction. Karen Lewis has indicated that she wanted to continue with the procedure.  Consent Attestation: I, the ordering provider, attest that I have discussed with the patient the benefits, risks, side-effects, alternatives, likelihood of achieving goals, and potential problems during recovery for the procedure that I have provided informed consent.  Pre-Procedure Preparation:  Safety Precautions: Allergies reviewed. The patient was asked about blood thinners, or active infections, both of which were denied. The patient was asked to confirm the procedure and laterality, before marking the site, and again before commencing the procedure. Appropriate site, procedure, and patient were confirmed by following the Joint Commission's Universal Protocol (UP.01.01.01), in the form of a "Time Out". The patient was asked to participate by confirming the accuracy of the "Time Out" information. Patient was assessed for positional comfort and pressure points before starting the procedure. Allergies: She is allergic to bactrim [sulfamethoxazole-trimethoprim]; xolair  [omalizumab]; aspirin; ciprofloxacin; motrin [ibuprofen]; sulfa antibiotics; clindamycin; and nsaids.. Allergy Precautions: None required Infection Control Precautions: Sterile technique used. Standard Universal Precautions were taken as recommended by the Department of [facility] for Disease Control and Prevention (CDC). Standard pre-surgical skin prep was conducted. Respiratory hygiene and cough etiquette was practiced. Hand hygiene observed. Safe injection practices and needle disposal techniques followed. SDV (single dose vial) medications used. Medications properly checked for expiration dates and contaminants. Personal protective equipment (PPE) used as per protocol. Monitoring:  As per clinic protocol. Vitals:   07/06/16 1102 07/06/16 1108 07/06/16 1113 07/06/16 1118  BP: 105/83 (!) 85/67 107/72 103/89  Pulse: 83 86 83 86  Resp: 16 19 16 15   Temp:      TempSrc:      SpO2: 96% 98% 100% 100%  Weight:      Height:      Calculated BMI: Body mass index is 72.78 kg/m. Time-out: "Time-out" completed before starting procedure, as per protocol.  Description of Procedure Process:   Time-out: "Time-out" completed before starting procedure, as per protocol. Position: Sitting Target Area: Knee Joint Approach: Just above the Lateral tibial plateau, lateral to the infrapatellar tendon. Area Prepped: Entire knee area, from the mid-thigh to the mid-shin. Prepping solution: ChloraPrep (2% chlorhexidine gluconate and 70% isopropyl alcohol) Safety Precautions: Aspiration looking for blood return was conducted prior to all injections. At no point did we inject any substances, as a needle was being advanced. No attempts were made at seeking any paresthesias. Safe injection practices and needle disposal techniques used. Medications properly checked for expiration dates. SDV (single dose vial) medications used. Description of the Procedure: Protocol guidelines were followed. The patient was  placed in position over the fluoroscopy table. The target area was identified and the area prepped in the usual manner. Skin desensitized using vapocoolant spray. Skin & deeper tissues infiltrated with local anesthetic. Appropriate amount of time allowed to pass for local anesthetics to take effect. The procedure needles were then advanced to the target  area. Proper needle placement secured. Negative aspiration confirmed. Solution injected in intermittent fashion, asking for systemic symptoms every 0.5cc of injectate. The needles were then removed and the area cleansed, making sure to leave some of the prepping solution back to take advantage of its long term bactericidal properties. EBL: None Materials & Medications Used:  Needle(s) Used: 22g - 1.5" Needle(s) Medications Administered today: Please see chart for details.  Imaging Guidance:  Type of Imaging Technique: Fluoroscopy Guidance (Non-spinal) Indication(s): Assistance in needle guidance and placement for procedures requiring needle placement in or near specific anatomical locations not easily accessible without such assistance. Exposure Time: Please see nurses notes. Contrast: None used. Fluoroscopic Guidance: I was personally present during the use of fluoroscopy. "Tunnel Vision Technique" used to obtain the best possible view of the target area. Parallax error corrected before commencing the procedure. "Direction-depth-direction" technique used to introduce the needle under continuous pulsed fluoroscopy. Once target was reached, antero-posterior, oblique, and lateral fluoroscopic projection used confirm needle placement in all planes. Images permanently stored in EMR. Ultrasound Guidance: N/A Interpretation: No contrast injected. I personally interpreted the imaging intraoperatively. Adequate needle placement confirmed in multiple planes. Permanent images saved into the patient's record.  Antibiotic Prophylaxis:  Indication(s): No  indications identified. Type:  Antibiotics Given (last 72 hours)    None      Post-operative Assessment:  Complications: No immediate post-treatment complications observed by team, or reported by patient. Disposition: The patient tolerated the entire procedure well. A repeat set of vitals were taken after the procedure and the patient was kept under observation following institutional policy, for this type of procedure. Post-procedural neurological assessment was performed, showing return to baseline, prior to discharge. The patient was provided with post-procedure discharge instructions, including a section on how to identify potential problems. Should any problems arise concerning this procedure, the patient was given instructions to immediately contact us, at any time, without hesitation. In any case, we plan to contact the patient by telephone for a follow-up status report regarding this interventional procedure. Comments:  No additional relevant information.  Plan of Care  Discharge to: Discharge home  Medications ordered for procedure: Meds ordered this encounter  Medications  . Sodium Hyaluronate SOSY 2 mL  . lidocaine (PF) (XYLOCAINE) 1 % injection 10 mL  . ropivacaine (PF) 2 mg/ml (0.2%) (NAROPIN) epidural 9 mL  . Sodium Hyaluronate SOSY 2 mL   Medications administered: (For more details, see medical record) We administered Sodium Hyaluronate, lidocaine (PF), ropivacaine (PF) 2 mg/ml (0.2%), and Sodium Hyaluronate.  Imaging Ordered: Results for orders placed in visit on 06/15/16  DG C-Arm 1-60 Min-No Report   Narrative CLINICAL DATA: Assistance in needle guidance and placement for procedures  requiring needle placement in or near specific anatomical locations not  easily accessible without such assistance.   C-ARM 1-60 MINUTES  Fluoroscopy was utilized by the requesting physician.  No radiographic  interpretation.     New Prescriptions   No medications on file    Physician-requested Follow-up:  Return in about 2 weeks (around 07/20/2016) for Post-Procedure evaluation, In addition, Schedule Procedure.  Future Appointments Date Time Provider [place]  07/20/2016 1:30 PM Elvin So, MD [facility] None  07/27/2016 8:15 AM Milinda Pointer, MD [facility] None   Primary Care Physician: Idelle Crouch, MD Location: [facility] Outpatient Pain Management Facility Note by: Kathlen Brunswick. [name], M.D, DABA, DABAPM, DABPM, DABIPP, FIPP  Disclaimer:  Medicine is not an exact science. The only guarantee in medicine is that nothing is  guaranteed. It is important to note that the decision to proceed with this intervention was based on the information collected from the patient. The Data and conclusions were drawn from the patient's questionnaire, the interview, and the physical examination. Because the information was provided in large part by the patient, it cannot be guaranteed that it has not been purposely or unconsciously manipulated. Every effort has been made to obtain as much relevant data as possible for this evaluation. It is important to note that the conclusions that lead to this procedure are derived in large part from the available data. Always take into account that the treatment will also be dependent on availability of resources and existing treatment guidelines, considered by other Pain Management Practitioners as being common knowledge and practice, at the time of the intervention. For Medico-Legal purposes, it is also important to point out that variation in procedural techniques and pharmacological choices are the acceptable norm. The indications, contraindications, technique, and results of the above procedure should only be interpreted and judged by a Board-Certified Interventional Pain Specialist with extensive familiarity and expertise in the same exact procedure and technique. Attempts at providing opinions without similar or greater  experience and expertise than that of the treating physician will be considered as inappropriate and unethical, and shall result in a formal complaint to the state medical board and applicable specialty societies.  Instructions provided at this appointment: Patient Instructions  Knee Injection A knee injection is a procedure to get medicine into your knee joint. Your health care provider puts a needle into the joint and injects medicine with an attached syringe. The injected medicine may relieve the pain, swelling, and stiffness of arthritis. The injected medicine may also help to lubricate and cushion your knee joint. You may need more than one injection. LET [facility] CARE PROVIDER KNOW ABOUT:  Any allergies you have.  All medicines you are taking, including vitamins, herbs, eye drops, creams, and over-the-counter medicines.  Previous problems you or members of your family have had with the use of anesthetics.  Any blood disorders you have.  Previous surgeries you have had.  Any medical conditions you may have. RISKS AND COMPLICATIONS Generally, this is a safe procedure. However, problems may occur, including:  Infection.  Bleeding.  Worsening symptoms.  Damage to the area around your knee.  Allergic reaction to any of the medicines.  Skin reactions from repeated injections. BEFORE THE PROCEDURE  Ask your health care provider about changing or stopping your regular medicines. This is especially important if you are taking diabetes medicines or blood thinners.  Plan to have someone take you home after the procedure. PROCEDURE  You will sit or lie down in a position for your knee to be treated.  The skin over your kneecap will be cleaned with a germ-killing solution (antiseptic).  You will be given a medicine that numbs the area (local anesthetic). You may feel some stinging.  After your knee becomes numb, you will have a second injection. This is the medicine. This  needle is carefully placed between your kneecap and your knee. The medicine is injected into the joint space.  At the end of the procedure, the needle will be removed.  A bandage (dressing) may be placed over the injection site. The procedure may vary among health care providers and hospitals. AFTER THE PROCEDURE  You may have to move your knee through its full range of motion. This helps to get all of the medicine into your joint space.  Your blood pressure, heart rate, breathing rate, and blood oxygen level will be monitored often until the medicines you were given have worn off.  You will be watched to make sure that you do not have a reaction to the injected medicine.   This information is not intended to replace advice given to you by your health care provider. Make sure you discuss any questions you have with your health care provider.   Document Released: 11/21/2006 Document Revised: 09/20/2014 Document Reviewed: 07/10/2014 Elsevier Interactive Patient Education 2016 [employer] Pain Management Discharge Instructions  General Discharge Instructions :  If you need to reach your doctor call: Monday-Friday 8:00 am - 4:00 pm at [phone] or toll free [phone].  After clinic hours [phone] to have operator reach doctor.  Bring all of your medication bottles to all your appointments in the pain clinic.  To cancel or reschedule your appointment with Pain Management please remember to call 24 hours in advance to avoid a fee.  Refer to the educational materials which you have been given on: General Risks, I had my Procedure. Discharge Instructions, Post Sedation.  Post Procedure Instructions:  The drugs you were given will stay in your system until tomorrow, so for the next 24 hours you should not drive, make any legal decisions or drink any alcoholic beverages.  You may eat anything you prefer, but it is better to start with liquids then soups and crackers, and  gradually work up to solid foods.  Please notify your doctor immediately if you have any unusual bleeding, trouble breathing or pain that is not related to your normal pain.  Depending on the type of procedure that was done, some parts of your body may feel week and/or numb.  This usually clears up by tonight or the next day.  Walk with the use of an assistive device or accompanied by an adult for the 24 hours.  You may use ice on the affected area for the first 24 hours.  Put ice in a Ziploc bag and cover with a towel and place against area 15 minutes on 15 minutes off.  You may switch to heat after 24 hours.

## 2016-07-07 ENCOUNTER — Telehealth: Payer: Self-pay | Admitting: *Deleted

## 2016-07-07 NOTE — Telephone Encounter (Signed)
Voicemail left for patient to call us if there are questions or concerns re; appt on yesterday.

## 2016-07-20 ENCOUNTER — Encounter: Payer: Self-pay | Admitting: Psychiatry

## 2016-07-20 ENCOUNTER — Ambulatory Visit (INDEPENDENT_AMBULATORY_CARE_PROVIDER_SITE_OTHER): Payer: Commercial Managed Care - HMO | Admitting: Psychiatry

## 2016-07-20 VITALS — BP 147/78 | HR 80 | Temp 97.7°F | Resp 17 | Wt >= 6400 oz

## 2016-07-20 DIAGNOSIS — F411 Generalized anxiety disorder: Secondary | ICD-10-CM

## 2016-07-20 DIAGNOSIS — F5081 Binge eating disorder: Secondary | ICD-10-CM

## 2016-07-20 DIAGNOSIS — F331 Major depressive disorder, recurrent, moderate: Secondary | ICD-10-CM

## 2016-07-20 MED ORDER — LISDEXAMFETAMINE DIMESYLATE 70 MG PO CAPS: 70.0000 mg | ORAL_CAPSULE | ORAL | 0 refills | Status: DC

## 2016-07-20 MED ORDER — QUETIAPINE FUMARATE 100 MG PO TABS: 100.0000 mg | ORAL_TABLET | Freq: Every day | ORAL | 0 refills | Status: DC

## 2016-07-20 NOTE — Progress Notes (Signed)
Prescriptions for Zoloft and Seroquel called in to [place] at [phone].

## 2016-07-20 NOTE — Progress Notes (Signed)
Patient ID: HOLIDAY MCMENAMIN, female   DOB: [birth year], 45 y.o.   MRN: [id number]  [facility] MD/PA/NP OP Progress Note  07/20/2016 2:13 PM RAYLEIGH GILLYARD  MRN:  [id number]  Subjective:  Patient is a 45 yo female with MDD and binge eating disorder. Patient reports that she is getting ready to have bariatric surgery and was told to lose 25 pounds before she has the surgery. States that she is going to be starting on a diet of protein shakes to help her lose the 25 pounds. States that her mood is okay and she is been focusing on reducing the weight and getting her health better. States that she is in a lot of pain from her joint issues and takes the hydrocodone as needed though it is prescribed once every 6 hours. Patient is also taking Vyvanse at 70 mg each morning and states that it helped somewhat with her binge eating that patient has been unable to lose any weight on this medication.   Chief Complaint: bingeing daily  Visit Diagnosis:     ICD-9-CM ICD-10-CM   1. Major depressive disorder, recurrent episode, moderate (HCC) 296.32 F33.1   2. GAD (generalized anxiety disorder) 300.02 F41.1   3. Binge eating disorder 307.50 F50.81     Past Medical History:  Past Medical History:  Diagnosis Date  . Anemia   . Anginal pain ([place])   . Anxiety   . Arthralgia of hip 07/29/2015  . Arthritis   . Arthritis, degenerative 07/29/2015  . Asthma   . Cephalalgia 07/25/2014  . Dependence on unknown drug ([place])    multiplt controlled drug dependence  . Depression   . Diabetes mellitus without complication ([place])   . Dysrhythmia   . Eczema   . Fibromyalgia   . Gastritis   . GERD (gastroesophageal reflux disease)   . Gonalgia 07/29/2015   Overview:  Overview:  The patient has had bilateral intra-articular Hyalgan injections done on 07/16/2014 and although she seems to do well with this type of therapy, apparently her insurance company does not want to pay for they Hyalgan. On 11/27/2014 the patient underwent a  bilateral genicular nerve block with excellent results. On 01/28/2015 she had a right knee genicular radiofrequency ablatio  . Gout   . H/O cardiovascular disorder 03/10/2015  . H/O surgical procedure 12/05/2012   Overview:  LSG ([facility] - April 2013)    . H/O thyroid disease 03/10/2015  . Headache   . Herpes   . History of artificial joint 07/29/2015  . History of hiatal hernia   . Hypertension   . Hypomagnesemia   . Hypothyroidism   . LBP (low back pain) 07/29/2015  . Neuromuscular disorder ([place])   . Obesity   . PCOS (polycystic ovarian syndrome)   . Primary osteoarthritis of both knees 07/29/2015  . Sleep apnea   . Thyroid nodule bilateral    Past Surgical History:  Procedure Laterality Date  . CARPAL TUNNEL RELEASE Bilateral   . CHOLECYSTECTOMY    . DIAGNOSTIC LAPAROSCOPY    . JOINT REPLACEMENT Left hip  . LAPAROSCOPIC PARTIAL GASTRECTOMY    . SHOULDER ARTHROSCOPY Right   . THYROIDECTOMY N/A 11/12/2015   Procedure: THYROIDECTOMY;  Surgeon: Clyde Canterbury, MD;  Location: [facility] ORS;  Service: ENT;  Laterality: N/A;  . TRIGGER FINGER RELEASE Right    Family History:  Family History  Problem Relation Age of Onset  . Anxiety disorder Mother   . Depression Mother   . Alcohol abuse Mother   .  Diabetes Mother   . Hypertension Mother   . Kidney cancer Mother   . Sleep apnea Mother   . Alcohol abuse Father   . Anxiety disorder Father   . Depression Father   . Post-traumatic stress disorder Father   . Kidney failure Father   . COPD Father   . Diabetes Father   . Hypertension Father   . Sleep apnea Father   . Depression Brother   . Diabetes Brother   . Hypertension Brother   . Sleep apnea Brother   . Breast cancer Paternal Aunt    Social History:  Social History   Social History  . Marital status: Single    Spouse name: N/A  . Number of children: N/A  . Years of education: N/A   Social History Main Topics  . Smoking status: Never Smoker  . Smokeless tobacco: Never  Used  . Alcohol use No  . Drug use: No  . Sexual activity: Not Currently   Other Topics Concern  . Not on file   Social History Narrative  . No narrative on file   Additional History:   Assessment:   Musculoskeletal: Strength & Muscle Tone: within normal limits Gait & Station: normal Patient leans: N/A  Psychiatric Specialty Exam: HPI  Review of Systems  Psychiatric/Behavioral: Negative for depression, hallucinations, memory loss, substance abuse and suicidal ideas. The patient is not nervous/anxious and does not have insomnia.   All other systems reviewed and are negative.   There were no vitals taken for this visit.There is no height or weight on file to calculate BMI.  General Appearance: Well Groomed  Eye Contact:  Good  Speech:  Normal Rate  Volume:  Normal  Mood:  good  Affect: normal  Thought Process:  Linear and Logical  Orientation:  Full (Time, Place, and Person)  Thought Content:  Negative  Suicidal Thoughts:  No  Homicidal Thoughts:  No  Memory:  Immediate;   Good Recent;   Good Remote;   Good  Judgement:  Good  Insight:  Good  Psychomotor Activity:  Negative  Concentration:  Good  Recall:  Good  Fund of Knowledge: Good  Language: Good  Akathisia:  Negative  Handed:  Right  AIMS (if indicated):   Assets:  Communication Skills Desire for Improvement  ADL's:  Intact  Cognition: WNL  Sleep:  fair   Is the patient at risk to self?  No. Has the patient been a risk to self in the past 6 months?  No. Has the patient been a risk to self within the distant past?  No. Is the patient a risk to others?  No. Has the patient been a risk to others in the past 6 months?  No. Has the patient been a risk to others within the distant past?  No.  Current Medications: Current Outpatient Prescriptions  Medication Sig Dispense Refill  . acyclovir (ZOVIRAX) 800 MG tablet Take 800 mg by mouth 3 times/day as needed-between meals & bedtime. Reported on 11/12/2015     . albuterol (PROVENTIL HFA;VENTOLIN HFA) 108 (90 BASE) MCG/ACT inhaler Inhale 2 puffs into the lungs every 6 (six) hours as needed for wheezing or shortness of breath.    Marland Kitchen amitriptyline (ELAVIL) 100 MG tablet Take 100 mg by mouth at bedtime.     Marland Kitchen azelastine (ASTELIN) 0.1 % nasal spray Place into the nose as needed.     . Blood Glucose Calibration (ACCU-CHEK AVIVA) SOLN     . Blood Glucose  Monitoring Suppl (ACCU-CHEK AVIVA PLUS) w/Device KIT     . Blood Glucose Monitoring Suppl (FIFTY50 GLUCOSE METER 2.0) w/Device KIT Use as directed. E11.0--ACCU CHEK AVIVA    . butalbital-acetaminophen-caffeine (FIORICET, ESGIC) 50-325-40 MG tablet 1 tablet every 6 (six) hours as needed.     . colestipol (COLESTID) 1 g tablet     . EPINEPHrine 0.3 mg/0.3 mL IJ SOAJ injection as needed.     . Fluticasone-Salmeterol (ADVAIR DISKUS) 500-50 MCG/DOSE AEPB Inhale 1 puff into the lungs 2 (two) times daily.     Marland Kitchen gabapentin (NEURONTIN) 600 MG tablet Take 1 tablet (600 mg total) by mouth every 6 (six) hours. (Patient taking differently: Take 1,200 mg by mouth 2 (two) times daily. ) 360 tablet 0  . gabapentin (NEURONTIN) 800 MG tablet 83m 3 tablets qam, 606m2 tablets noon, 80058m tablets qpm and 600m16mtablets qhs    . glipiZIDE (GLUCOTROL XL) 5 MG 24 hr tablet Take 5 mg by mouth daily with breakfast.     . ipratropium-albuterol (DUONEB) 0.5-2.5 (3) MG/3ML SOLN as needed.     . keMarland Kitchenoconazole (NIZORAL) 2 % cream APPLY AS DIRECTED TO AFFECTED AREA TWICE DAILY AS NEEDED    . levocetirizine (XYZAL) 5 MG tablet Take 5 mg by mouth every evening.     . leMarland Kitchenothyroxine (SYNTHROID, LEVOTHROID) 112 MCG tablet Take 112 mcg by mouth daily before breakfast.     . lisdexamfetamine (VYVANSE) 70 MG capsule Take 1 capsule (70 mg total) by mouth every morning. 30 capsule 0  . magnesium oxide (MAG-OX) 400 MG tablet Take 1 tablet (400 mg total) by mouth 2 (two) times daily. 60 tablet 2  . meclizine (ANTIVERT) 25 MG tablet Take 1 tablet  (25 mg total) by mouth 3 (three) times daily as needed for dizziness. 30 tablet 0  . medroxyPROGESTERone (DEPO-PROVERA) 150 MG/ML injection Inject 150 mg into the muscle every 3 (three) months.     . meloxicam (MOBIC) 15 MG tablet 15 mg as needed.     . metFORMIN (GLUCOPHAGE) 1000 MG tablet Take 1,000 mg by mouth 2 (two) times daily with a meal.     . metoprolol (LOPRESSOR) 50 MG tablet Take 75 mg by mouth 2 (two) times daily.     . montelukast (SINGULAIR) 10 MG tablet Take 10 mg by mouth at bedtime.     . Multiple Vitamin (MULTIVITAMIN WITH MINERALS) TABS tablet Take 2 tablets by mouth daily. Reported on 11/12/2015    . omeprazole (PRILOSEC) 40 MG capsule Take 40 mg by mouth 2 (two) times daily.     . oxMarland KitchenCODONE-acetaminophen (PERCOCET/ROXICET) 5-325 MG tablet Take 1 tablet by mouth every 6 (six) hours as needed for severe pain. 120 tablet 0  . oxyCODONE-acetaminophen (PERCOCET/ROXICET) 5-325 MG tablet Take 1 tablet by mouth every 6 (six) hours as needed for severe pain. 120 tablet 0  . [START ON 08/12/2016] oxyCODONE-acetaminophen (PERCOCET/ROXICET) 5-325 MG tablet Take 1 tablet by mouth every 6 (six) hours as needed for severe pain. 120 tablet 0  . predniSONE (DELTASONE) 10 MG tablet as needed.     . QUMarland Kitchentiapine (SEROQUEL) 100 MG tablet Take 1 tablet (100 mg total) by mouth at bedtime. 90 tablet 0  . ranitidine (ZANTAC) 150 MG capsule Take 150 mg by mouth every evening.     . rosuvastatin (CRESTOR) 10 MG tablet Take 10 mg by mouth.    . sertraline (ZOLOFT) 100 MG tablet Take 2 tablets (200 mg total) by mouth daily. [place]  tablet 2  . spironolactone (ALDACTONE) 25 MG tablet Take 37.5 mg by mouth 2 (two) times daily.     . sucralfate (CARAFATE) 1 g tablet Take 2 g by mouth 2 (two) times daily.     Marland Kitchen tiZANidine (ZANAFLEX) 4 MG tablet 4 mg at bedtime.     . triamcinolone (KENALOG) 0.1 % paste Apply topically as needed.     . triamcinolone (NASACORT) 55 MCG/ACT AERO nasal inhaler 2 sprays by Nasal route 2  (two) times daily.    . TRUE METRIX BLOOD GLUCOSE TEST test strip     . Vitamin D, Ergocalciferol, (DRISDOL) 50000 units CAPS capsule Take 1 capsule (50,000 Units total) by mouth 2 (two) times a week. X 6 weeks. 12 capsule 0  . zolpidem (AMBIEN) 10 MG tablet Take 1 tablet (10 mg total) by mouth at bedtime as needed for sleep. 30 tablet 2  . zonisamide (ZONEGRAN) 50 MG capsule Take 150 mg by mouth daily. 3 tabs at bedtime     No current facility-administered medications for this visit.     Medical Decision Making:  Established Problem, Worsening (2), Review of Medication Regimen & Side Effects (2) and Review of New Medication or Change in Dosage (2)  Treatment Plan Summary:Medication management and Plan   Major depressive disorder Continue Sertraline at 200 mg daily.  Continue on the Seroquel 100 mg at bedtime.   Anxiety Same as above  Binge Eating Disorder-Continue  Vyvanse 70 mg. Discussed with patient that if she is unable to lose weight on this medication we will discontinue it since there is no point in adding another medication if it is not effective  Continue to see therapist regularly and work on reducing her binging, patient will start to see a counselor with trauma experience to help her address her previous trauma. Return to clinic in 3 months time or call before if necessary      Joyelle Siedlecki 07/20/2016, 2:13 PM

## 2016-07-27 ENCOUNTER — Telehealth: Payer: Self-pay | Admitting: *Deleted

## 2016-07-27 ENCOUNTER — Encounter: Payer: Commercial Managed Care - HMO | Admitting: Pain Medicine

## 2016-07-27 NOTE — Telephone Encounter (Signed)
This has been takin care of - patient is scheduled with TD - auth recvd - MD will need to enter new order for procedure on DOS

## 2016-07-27 NOTE — Telephone Encounter (Signed)
Karen Lewis         Please assist with what I need to do to help this patient. Thank you- Anderson Malta

## 2016-07-28 DIAGNOSIS — Z202 Contact with and (suspected) exposure to infections with a predominantly sexual mode of transmission: Secondary | ICD-10-CM | POA: Diagnosis not present

## 2016-07-28 DIAGNOSIS — Z113 Encounter for screening for infections with a predominantly sexual mode of transmission: Secondary | ICD-10-CM | POA: Diagnosis not present

## 2016-07-28 DIAGNOSIS — Z01419 Encounter for gynecological examination (general) (routine) without abnormal findings: Secondary | ICD-10-CM | POA: Diagnosis not present

## 2016-07-28 LAB — HM PAP SMEAR: HM PAP: NEGATIVE

## 2016-08-04 DIAGNOSIS — G4733 Obstructive sleep apnea (adult) (pediatric): Secondary | ICD-10-CM | POA: Diagnosis not present

## 2016-08-04 DIAGNOSIS — I10 Essential (primary) hypertension: Secondary | ICD-10-CM | POA: Diagnosis not present

## 2016-08-04 DIAGNOSIS — E119 Type 2 diabetes mellitus without complications: Secondary | ICD-10-CM | POA: Diagnosis not present

## 2016-08-04 DIAGNOSIS — R0602 Shortness of breath: Secondary | ICD-10-CM | POA: Insufficient documentation

## 2016-08-04 DIAGNOSIS — Z01810 Encounter for preprocedural cardiovascular examination: Secondary | ICD-10-CM | POA: Diagnosis not present

## 2016-08-04 DIAGNOSIS — R9431 Abnormal electrocardiogram [ECG] [EKG]: Secondary | ICD-10-CM | POA: Diagnosis not present

## 2016-08-04 DIAGNOSIS — R079 Chest pain, unspecified: Secondary | ICD-10-CM | POA: Diagnosis not present

## 2016-08-09 ENCOUNTER — Ambulatory Visit: Payer: Commercial Managed Care - HMO | Attending: Pain Medicine | Admitting: Pain Medicine

## 2016-08-09 ENCOUNTER — Encounter: Payer: Self-pay | Admitting: Pain Medicine

## 2016-08-09 ENCOUNTER — Ambulatory Visit
Admission: RE | Admit: 2016-08-09 | Discharge: 2016-08-09 | Disposition: A | Discharge status: Home / Self Care | Payer: Commercial Managed Care - HMO | Source: Ambulatory Visit | Attending: Pain Medicine | Admitting: Pain Medicine

## 2016-08-09 VITALS — BP 126/77 | HR 86 | Temp 97.6°F | Resp 15 | Ht 64.0 in | Wt >= 6400 oz

## 2016-08-09 DIAGNOSIS — M47896 Other spondylosis, lumbar region: Secondary | ICD-10-CM | POA: Diagnosis not present

## 2016-08-09 DIAGNOSIS — Z9049 Acquired absence of other specified parts of digestive tract: Secondary | ICD-10-CM | POA: Insufficient documentation

## 2016-08-09 DIAGNOSIS — Z9889 Other specified postprocedural states: Secondary | ICD-10-CM | POA: Insufficient documentation

## 2016-08-09 DIAGNOSIS — Z0001 Encounter for general adult medical examination with abnormal findings: Secondary | ICD-10-CM | POA: Diagnosis not present

## 2016-08-09 DIAGNOSIS — Z882 Allergy status to sulfonamides status: Secondary | ICD-10-CM | POA: Insufficient documentation

## 2016-08-09 DIAGNOSIS — E559 Vitamin D deficiency, unspecified: Secondary | ICD-10-CM | POA: Diagnosis not present

## 2016-08-09 DIAGNOSIS — M1288 Other specific arthropathies, not elsewhere classified, other specified site: Secondary | ICD-10-CM | POA: Diagnosis not present

## 2016-08-09 DIAGNOSIS — G8929 Other chronic pain: Secondary | ICD-10-CM | POA: Diagnosis not present

## 2016-08-09 DIAGNOSIS — M545 Low back pain: Secondary | ICD-10-CM

## 2016-08-09 DIAGNOSIS — Z886 Allergy status to analgesic agent status: Secondary | ICD-10-CM | POA: Insufficient documentation

## 2016-08-09 DIAGNOSIS — M47816 Spondylosis without myelopathy or radiculopathy, lumbar region: Secondary | ICD-10-CM | POA: Diagnosis not present

## 2016-08-09 DIAGNOSIS — Z79899 Other long term (current) drug therapy: Secondary | ICD-10-CM | POA: Diagnosis not present

## 2016-08-09 DIAGNOSIS — M25551 Pain in right hip: Secondary | ICD-10-CM | POA: Insufficient documentation

## 2016-08-09 DIAGNOSIS — E282 Polycystic ovarian syndrome: Secondary | ICD-10-CM | POA: Diagnosis not present

## 2016-08-09 DIAGNOSIS — Z888 Allergy status to other drugs, medicaments and biological substances status: Secondary | ICD-10-CM | POA: Insufficient documentation

## 2016-08-09 DIAGNOSIS — E119 Type 2 diabetes mellitus without complications: Secondary | ICD-10-CM | POA: Diagnosis not present

## 2016-08-09 DIAGNOSIS — M25561 Pain in right knee: Secondary | ICD-10-CM | POA: Insufficient documentation

## 2016-08-09 DIAGNOSIS — E890 Postprocedural hypothyroidism: Secondary | ICD-10-CM | POA: Diagnosis not present

## 2016-08-09 MED ORDER — MIDAZOLAM HCL 5 MG/5ML IJ SOLN
1.0000 mg | INTRAMUSCULAR | Status: DC | PRN
  Filled 2016-08-09: qty 5

## 2016-08-09 MED ORDER — TRIAMCINOLONE ACETONIDE 40 MG/ML IJ SUSP
40.0000 mg | Freq: Once | INTRAMUSCULAR | Status: AC
  Administered 2016-08-09: 40 mg
  Filled 2016-08-09: qty 1

## 2016-08-09 MED ORDER — LIDOCAINE HCL (PF) 1 % IJ SOLN
10.0000 mL | Freq: Once | INTRAMUSCULAR | Status: AC
Start: 2016-08-09 — End: 2016-08-09
  Administered 2016-08-09: 10 mL
  Filled 2016-08-09: qty 10

## 2016-08-09 MED ORDER — ROPIVACAINE HCL 2 MG/ML IJ SOLN
9.0000 mL | Freq: Once | INTRAMUSCULAR | Status: AC
  Administered 2016-08-09: 9 mL
  Filled 2016-08-09: qty 20

## 2016-08-09 MED ORDER — FENTANYL CITRATE (PF) 100 MCG/2ML IJ SOLN
25.0000 ug | INTRAMUSCULAR | Status: DC | PRN
  Filled 2016-08-09: qty 2

## 2016-08-09 MED ORDER — ROPIVACAINE HCL 2 MG/ML IJ SOLN
9.0000 mL | Freq: Once | INTRAMUSCULAR | Status: AC
  Administered 2016-08-09: 9 mL
  Filled 2016-08-09: qty 10

## 2016-08-09 MED ORDER — LACTATED RINGERS IV SOLN
1000.0000 mL | Freq: Once | INTRAVENOUS | Status: AC
  Administered 2016-08-09: 1000 mL via INTRAVENOUS

## 2016-08-09 NOTE — Progress Notes (Signed)
Patient's Name: Karen Lewis  MRN: [id number]  Referring Provider: Milinda Pointer, MD  DOB: [birth year]  PCP: Karen Crouch, MD  DOS: 08/09/2016  Note by: Karen Lewis. Karen Arbour, MD  Service setting: Ambulatory outpatient  Location: [facility] (AMB) Pain Management Facility  Visit type: Procedure  Specialty: Interventional Pain Management  Patient type: Established   Primary Reason for Visit: Interventional Pain Management Treatment. CC: Back Pain (mid to lower); Hip Pain (right); and Knee Pain (right)  Procedure:  Anesthesia, Analgesia, Anxiolysis:  Type: Palliative Medial Branch Facet Block Region: Lumbar Level: L2, L3, L4, L5, & S1 Medial Branch Level(s) Laterality: Bilateral  Type: Local Anesthesia with Moderate (Conscious) Sedation Local Anesthetic: Lidocaine 1% Route: Intravenous (IV) IV Access: Secured Sedation: Meaningful verbal contact was maintained at all times during the procedure  Indication(s): Analgesia and Anxiety  Indications: 1. Lumbar facet syndrome (Bilateral) (R>L)   2. Chronic low back pain (R>L)   3. Lumbar spondylosis    Pain Score: Pre-procedure: 4 /10 Post-procedure: 0-No pain/10  Pre-Procedure Assessment:  Karen Lewis is a 45 y.o. (year old), female patient, seen today for interventional treatment. She  has a past surgical history that includes Laparoscopic partial gastrectomy; Shoulder arthroscopy (Right); Joint replacement (Left, hip); Carpal tunnel release (Bilateral); Diagnostic laparoscopy; Cholecystectomy; Trigger finger release (Right); and Thyroidectomy (N/A, 11/12/2015).. Her primarily concern today is the Back Pain (mid to lower); Hip Pain (right); and Knee Pain (right) The primary encounter diagnosis was Lumbar facet syndrome (Bilateral) (R>L). Diagnoses of Chronic low back pain (R>L) and Lumbar spondylosis were also pertinent to this visit.  Pain Type: Chronic pain Pain Location: Back Pain Orientation: Mid, Lower Pain Descriptors / Indicators: Aching,  Constant, Sharp, Burning Pain Frequency: Constant  Date of Last Visit: 07/06/16 Service Provided on Last Visit: Procedure (both knees injected)  Coagulation Parameters Lab Results  Component Value Date   INR 0.95 05/07/2015   LABPROT 12.9 05/07/2015   APTT 26 05/07/2015   PLT 337 11/12/2015   Verification of the correct person, correct site (including marking of site), and correct procedure were performed and confirmed by the patient.  Consent: Before the procedure and under the influence of no sedative(s), amnesic(s), or anxiolytics, the patient was informed of the treatment options, risks and possible complications. To fulfill our ethical and legal obligations, as recommended by the American Medical Association's Code of Ethics, I have informed the patient of my clinical impression; the nature and purpose of the treatment or procedure; the risks, benefits, and possible complications of the intervention; the alternatives, including doing nothing; the risk(s) and benefit(s) of the alternative treatment(s) or procedure(s); and the risk(s) and benefit(s) of doing nothing. The patient was provided information about the general risks and possible complications associated with the procedure. These may include, but are not limited to: failure to achieve desired goals, infection, bleeding, organ or nerve damage, allergic reactions, paralysis, and death. In addition, the patient was informed of those risks and complications associated to Spine-related procedures, such as failure to decrease pain; infection (i.e.: Meningitis, epidural or intraspinal abscess); bleeding (i.e.: epidural hematoma, subarachnoid hemorrhage, or any other type of intraspinal or peri-dural bleeding); organ or nerve damage (i.e.: Any type of peripheral nerve, nerve root, or spinal cord injury) with subsequent damage to sensory, motor, and/or autonomic systems, resulting in permanent pain, numbness, and/or weakness of one or several  areas of the body; allergic reactions; (i.e.: anaphylactic reaction); and/or death. Furthermore, the patient was informed of those risks and complications associated with  the medications. These include, but are not limited to: allergic reactions (i.e.: anaphylactic or anaphylactoid reaction(s)); adrenal axis suppression; blood sugar elevation that in diabetics may result in ketoacidosis or comma; water retention that in patients with history of congestive heart failure may result in shortness of breath, pulmonary edema, and decompensation with resultant heart failure; weight gain; swelling or edema; medication-induced neural toxicity; particulate matter embolism and blood vessel occlusion with resultant organ, and/or nervous system infarction; and/or aseptic necrosis of one or more joints. Finally, the patient was informed that Medicine is not an exact science; therefore, there is also the possibility of unforeseen or unpredictable risks and/or possible complications that may result in a catastrophic outcome. The patient indicated having understood very clearly. We have given the patient no guarantees and we have made no promises. Enough time was given to the patient to ask questions, all of which were answered to the patient's satisfaction. Karen Lewis has indicated that she wanted to continue with the procedure.  Consent Attestation: I, the ordering provider, attest that I have discussed with the patient the benefits, risks, side-effects, alternatives, likelihood of achieving goals, and potential problems during recovery for the procedure that I have provided informed consent.  Pre-Procedure Preparation:  Safety Precautions: Allergies reviewed. The patient was asked about blood thinners, or active infections, both of which were denied. The patient was asked to confirm the procedure and laterality, before marking the site, and again before commencing the procedure. Appropriate site, procedure, and patient were  confirmed by following the Joint Commission's Universal Protocol (UP.01.01.01), in the form of a "Time Out". The patient was asked to participate by confirming the accuracy of the "Time Out" information. Patient was assessed for positional comfort and pressure points before starting the procedure. Allergies: She is allergic to bactrim [sulfamethoxazole-trimethoprim]; xolair [omalizumab]; aspirin; ciprofloxacin; motrin [ibuprofen]; sulfa antibiotics; clindamycin; and nsaids. Allergy Precautions: None required Infection Control Precautions: Sterile technique used. Standard Universal Precautions were taken as recommended by the Department of [place] for Disease Control and Prevention (CDC). Standard pre-surgical skin prep was conducted. Respiratory hygiene and cough etiquette was practiced. Hand hygiene observed. Safe injection practices and needle disposal techniques followed. SDV (single dose vial) medications used. Medications properly checked for expiration dates and contaminants. Personal protective equipment (PPE) used as per protocol. Monitoring:  As per clinic protocol. Vitals:   08/09/16 0954 08/09/16 0958 08/09/16 1010 08/09/16 1019  BP: (!) 141/96 (!) 142/120 121/61 126/77  Pulse: 91  88 86  Resp: 20 18 15 15   Temp:  97.1 F (36.2 C)  97.6 F (36.4 C)  TempSrc:      SpO2: 96% 97% 94% 94%  Weight:      Height:      Calculated BMI: Body mass index is 71.92 kg/m. Time-out: "Time-out" completed before starting procedure, as per protocol.  Description of Procedure Process:   Time-out: "Time-out" completed before starting procedure, as per protocol. Position: Prone Target Area: For Lumbar Facet blocks, the target is the groove formed by the junction of the transverse process and superior articular process. For the L5 dorsal ramus, the target is the notch between superior articular process and sacral ala. For the S1 dorsal ramus, the target is the superior and lateral edge of  the posterior S1 Sacral foramen. Approach: Paramedial approach. Area Prepped: Entire Posterior Lumbosacral Region Prepping solution: ChloraPrep (2% chlorhexidine gluconate and 70% isopropyl alcohol) Safety Precautions: Aspiration looking for blood return was conducted prior to all injections. At no point  did we inject any substances, as a needle was being advanced. No attempts were made at seeking any paresthesias. Safe injection practices and needle disposal techniques used. Medications properly checked for expiration dates. SDV (single dose vial) medications used. Description of the Procedure: Protocol guidelines were followed. The patient was placed in position over the fluoroscopy table. The target area was identified and the area prepped in the usual manner. Skin desensitized using vapocoolant spray. Skin & deeper tissues infiltrated with local anesthetic. Appropriate amount of time allowed to pass for local anesthetics to take effect. The procedure needle was introduced through the skin, ipsilateral to the reported pain, and advanced to the target area. Employing the "Medial Branch Technique", the needles were advanced to the angle made by the superior and medial portion of the transverse process, and the lateral and inferior portion of the superior articulating process of the targeted vertebral bodies. This area is known as "Burton's Eye" or the "Eye of the [place] Dog". A procedure needle was introduced through the skin, and this time advanced to the angle made by the superior and medial border of the sacral ala, and the lateral border of the S1 vertebral body. This last needle was later repositioned at the superior and lateral border of the posterior S1 foramen. Negative aspiration confirmed. Solution injected in intermittent fashion, asking for systemic symptoms every 0.5cc of injectate. The needles were then removed and the area cleansed, making sure to leave some of the prepping solution back to  take advantage of its long term bactericidal properties. EBL: None Materials & Medications Used:  Needle(s) Used: 22g - 3.5" Spinal Needle(s)   Illustration of the posterior view of the lumbar spine and the posterior neural structures. Laminae of L2 through S1 are labeled. DPRL5, dorsal primary ramus of L5; DPRS1, dorsal primary ramus of S1; DPR3, dorsal primary ramus of L3; FJ, facet (zygapophyseal) joint L3-L4; I, inferior articular process of L4; LB1, lateral branch of dorsal primary ramus of L1; IAB, inferior articular branches from L3 medial branch (supplies L4-L5 facet joint); IBP, intermediate branch plexus; MB3, medial branch of dorsal primary ramus of L3; NR3, third lumbar nerve root; S, superior articular process of L5; SAB, superior articular branches from L4 (supplies L4-5 facet joint also); TP3, transverse process of L3.  Imaging Guidance (Spinal):  Type of Imaging Technique: Fluoroscopy Guidance (Spinal) Indication(s): Assistance in needle guidance and placement for procedures requiring needle placement in or near specific anatomical locations not easily accessible without such assistance. Exposure Time: Please see nurses notes. Contrast: None used. Fluoroscopic Guidance: I was personally present during the use of fluoroscopy. "Tunnel Vision Technique" used to obtain the best possible view of the target area. Parallax error corrected before commencing the procedure. "Direction-depth-direction" technique used to introduce the needle under continuous pulsed fluoroscopy. Once target was reached, antero-posterior, oblique, and lateral fluoroscopic projection used confirm needle placement in all planes. Images permanently stored in EMR. Interpretation: No contrast injected. I personally interpreted the imaging intraoperatively. Adequate needle placement confirmed in multiple planes. Permanent images saved into the patient's record.  Antibiotic Prophylaxis:  Indication(s): No indications  identified. Type:  Antibiotics Given (last 72 hours)    None      Post-operative Assessment:  Complications: No immediate post-treatment complications observed by team, or reported by patient. Disposition: The patient tolerated the entire procedure well. A repeat set of vitals were taken after the procedure and the patient was kept under observation following institutional policy, for this type of procedure. Post-procedural  neurological assessment was performed, showing return to baseline, prior to discharge. The patient was provided with post-procedure discharge instructions, including a section on how to identify potential problems. Should any problems arise concerning this procedure, the patient was given instructions to immediately contact us, at any time, without hesitation. In any case, we plan to contact the patient by telephone for a follow-up status report regarding this interventional procedure. Comments:  No additional relevant information.  Plan of Care  Discharge to: Discharge home  Medications ordered for procedure: Meds ordered this encounter  Medications  . fentaNYL (SUBLIMAZE) injection 25-50 mcg    Make sure Narcan is available in the pyxis when using this medication. In the event of respiratory depression (RR< 8/min): Titrate NARCAN (naloxone) in increments of 0.1 to 0.2 mg IV at 2-3 minute intervals, until desired degree of reversal.  . lactated ringers infusion 1,000 mL  . midazolam (VERSED) 5 MG/5ML injection 1-2 mg    Make sure Flumazenil is available in the pyxis when using this medication. If oversedation occurs, administer 0.2 mg IV over 15 sec. If after 45 sec no response, administer 0.2 mg again over 1 min; may repeat at 1 min intervals; not to exceed 4 doses (1 mg)  . triamcinolone acetonide (KENALOG-40) injection 40 mg  . lidocaine (PF) (XYLOCAINE) 1 % injection 10 mL  . ropivacaine (PF) 2 mg/ml (0.2%) (NAROPIN) epidural 9 mL  . triamcinolone acetonide  (KENALOG-40) injection 40 mg  . ropivacaine (PF) 2 mg/ml (0.2%) (NAROPIN) epidural 9 mL   Medications administered: (For more details, see medical record) We administered lactated ringers, triamcinolone acetonide, lidocaine (PF), ropivacaine (PF) 2 mg/ml (0.2%), triamcinolone acetonide, and ropivacaine (PF) 2 mg/ml (0.2%). Lab-work, Procedure(s), & Referral(s) Ordered: Orders Placed This Encounter  Procedures  . DG C-Arm 1-60 Min-No Report   Imaging Ordered: Results for orders placed in visit on 07/06/16  DG C-Arm 1-60 Min-No Report   Narrative CLINICAL DATA: Assistance in needle guidance and placement for procedures  requiring needle placement in or near specific anatomical locations not  easily accessible without such assistance.   C-ARM 1-60 MINUTES  Fluoroscopy was utilized by the requesting physician.  No radiographic  interpretation.     New Prescriptions   No medications on file   Physician-requested Follow-up:  Return in about 2 weeks (around 08/23/2016) for Post-Procedure evaluation.  Future Appointments Date Time Provider [place]  08/23/2016 10:15 AM Karen Pointer, MD [facility] None   Primary Care Physician: Karen Crouch, MD Location: [facility] Note by: Karen Lewis. Karen Lewis, M.D, DABA, DABAPM, DABPM, DABIPP, FIPP  Disclaimer:  Medicine is not an exact science. The only guarantee in medicine is that nothing is guaranteed. It is important to note that the decision to proceed with this intervention was based on the information collected from the patient. The Data and conclusions were drawn from the patient's questionnaire, the interview, and the physical examination. Because the information was provided in large part by the patient, it cannot be guaranteed that it has not been purposely or unconsciously manipulated. Every effort has been made to obtain as much relevant data as possible for this evaluation. It is important to  note that the conclusions that lead to this procedure are derived in large part from the available data. Always take into account that the treatment will also be dependent on availability of resources and existing treatment guidelines, considered by other Pain Management Practitioners as being common knowledge and practice, at the time  of the intervention. For Medico-Legal purposes, it is also important to point out that variation in procedural techniques and pharmacological choices are the acceptable norm. The indications, contraindications, technique, and results of the above procedure should only be interpreted and judged by a Board-Certified Interventional Pain Specialist with extensive familiarity and expertise in the same exact procedure and technique. Attempts at providing opinions without similar or greater experience and expertise than that of the treating physician will be considered as inappropriate and unethical, and shall result in a formal complaint to the state medical board and applicable specialty societies.  Instructions provided at this appointment: Patient Instructions  Pain Management Discharge Instructions  General Discharge Instructions :  If you need to reach your doctor call: Monday-Friday 8:00 am - 4:00 pm at [phone] or toll free [phone].  After clinic hours [phone] to have operator reach doctor.  Bring all of your medication bottles to all your appointments in the pain clinic.  To cancel or reschedule your appointment with Pain Management please remember to call 24 hours in advance to avoid a fee.  Refer to the educational materials which you have been given on: General Risks, I had my Procedure. Discharge Instructions, Post Sedation.  Post Procedure Instructions:  The drugs you were given will stay in your system until tomorrow, so for the next 24 hours you should not drive, make any legal decisions or drink any alcoholic beverages.  You may eat  anything you prefer, but it is better to start with liquids then soups and crackers, and gradually work up to solid foods.  Please notify your doctor immediately if you have any unusual bleeding, trouble breathing or pain that is not related to your normal pain.  Depending on the type of procedure that was done, some parts of your body may feel week and/or numb.  This usually clears up by tonight or the next day.  Walk with the use of an assistive device or accompanied by an adult for the 24 hours.  You may use ice on the affected area for the first 24 hours.  Put ice in a Ziploc bag and cover with a towel and place against area 15 minutes on 15 minutes off.  You may switch to heat after 24 hours.

## 2016-08-09 NOTE — Progress Notes (Signed)
Safety precautions to be maintained throughout the outpatient stay will include: orient to surroundings, keep bed in low position, maintain call bell within reach at all times, provide assistance with transfer out of bed and ambulation.  

## 2016-08-09 NOTE — Patient Instructions (Signed)

## 2016-08-10 ENCOUNTER — Telehealth: Payer: Self-pay | Admitting: *Deleted

## 2016-08-10 NOTE — Telephone Encounter (Signed)
Spoke with patient re; feeling hot and itchy.  States itchiness is only on the right side, has taken Benadryl didn't help.  Denies fever or rash.  Encouraged patient to drink plenty of fluids.  Instructed patient that if s/s become worse and she is concerned that she should go to the ED.  Patient verbalizes u/o information.

## 2016-08-10 NOTE — Telephone Encounter (Signed)
LVM for patient to return call with any concerns post procedure.

## 2016-08-11 DIAGNOSIS — Z713 Dietary counseling and surveillance: Secondary | ICD-10-CM | POA: Diagnosis not present

## 2016-08-11 DIAGNOSIS — R0602 Shortness of breath: Secondary | ICD-10-CM | POA: Diagnosis not present

## 2016-08-11 DIAGNOSIS — N390 Urinary tract infection, site not specified: Secondary | ICD-10-CM | POA: Diagnosis not present

## 2016-08-11 DIAGNOSIS — E119 Type 2 diabetes mellitus without complications: Secondary | ICD-10-CM | POA: Diagnosis not present

## 2016-08-11 DIAGNOSIS — M79645 Pain in left finger(s): Secondary | ICD-10-CM | POA: Diagnosis not present

## 2016-08-11 DIAGNOSIS — R319 Hematuria, unspecified: Secondary | ICD-10-CM | POA: Diagnosis not present

## 2016-08-13 DIAGNOSIS — E282 Polycystic ovarian syndrome: Secondary | ICD-10-CM | POA: Diagnosis not present

## 2016-08-13 DIAGNOSIS — Z9884 Bariatric surgery status: Secondary | ICD-10-CM | POA: Diagnosis not present

## 2016-08-13 DIAGNOSIS — E559 Vitamin D deficiency, unspecified: Secondary | ICD-10-CM | POA: Diagnosis not present

## 2016-08-13 DIAGNOSIS — E119 Type 2 diabetes mellitus without complications: Secondary | ICD-10-CM | POA: Diagnosis not present

## 2016-08-13 DIAGNOSIS — E890 Postprocedural hypothyroidism: Secondary | ICD-10-CM | POA: Diagnosis not present

## 2016-08-23 ENCOUNTER — Ambulatory Visit (HOSPITAL_BASED_OUTPATIENT_CLINIC_OR_DEPARTMENT_OTHER): Payer: Medicare HMO | Admitting: Pain Medicine

## 2016-08-23 ENCOUNTER — Encounter: Payer: Self-pay | Admitting: Pain Medicine

## 2016-08-23 ENCOUNTER — Ambulatory Visit
Admission: RE | Admit: 2016-08-23 | Discharge: 2016-08-23 | Disposition: A | Discharge status: Home / Self Care | Payer: Medicare HMO | Source: Ambulatory Visit | Attending: Pain Medicine | Admitting: Pain Medicine

## 2016-08-23 VITALS — BP 148/87 | HR 88 | Temp 98.4°F | Resp 16 | Ht 64.0 in | Wt >= 6400 oz

## 2016-08-23 DIAGNOSIS — M25551 Pain in right hip: Secondary | ICD-10-CM | POA: Insufficient documentation

## 2016-08-23 DIAGNOSIS — M170 Bilateral primary osteoarthritis of knee: Secondary | ICD-10-CM | POA: Diagnosis not present

## 2016-08-23 DIAGNOSIS — Z5189 Encounter for other specified aftercare: Secondary | ICD-10-CM | POA: Insufficient documentation

## 2016-08-23 DIAGNOSIS — Z9049 Acquired absence of other specified parts of digestive tract: Secondary | ICD-10-CM | POA: Diagnosis not present

## 2016-08-23 DIAGNOSIS — Z9889 Other specified postprocedural states: Secondary | ICD-10-CM | POA: Diagnosis not present

## 2016-08-23 DIAGNOSIS — M25562 Pain in left knee: Secondary | ICD-10-CM | POA: Diagnosis not present

## 2016-08-23 DIAGNOSIS — G8929 Other chronic pain: Secondary | ICD-10-CM | POA: Diagnosis not present

## 2016-08-23 DIAGNOSIS — M25561 Pain in right knee: Secondary | ICD-10-CM | POA: Diagnosis not present

## 2016-08-23 MED ORDER — LIDOCAINE HCL (PF) 1 % IJ SOLN
10.0000 mL | Freq: Once | INTRAMUSCULAR | Status: DC
  Filled 2016-08-23: qty 10

## 2016-08-23 MED ORDER — SODIUM HYALURONATE (VISCOSUP) 20 MG/2ML IX SOSY
2.0000 mL | PREFILLED_SYRINGE | Freq: Once | INTRA_ARTICULAR | Status: AC
  Administered 2016-08-30: 2 mL via INTRA_ARTICULAR
  Filled 2016-08-23: qty 2

## 2016-08-23 MED ORDER — LIDOCAINE HCL (PF) 1 % IJ SOLN
INTRAMUSCULAR | Status: AC
  Filled 2016-08-23: qty 5

## 2016-08-23 MED ORDER — ROPIVACAINE HCL 2 MG/ML IJ SOLN: 5.0000 mL | Freq: Once | INTRAMUSCULAR | Status: DC

## 2016-08-23 NOTE — Progress Notes (Addendum)
Patient's Name: Karen Lewis  MRN: [id number]  Referring Provider: Idelle Crouch, MD  DOB: 1971-05-21  PCP: Idelle Crouch, MD  DOS: 08/23/2016  Note by: Kathlen Brunswick. [name], MD  Service setting: Ambulatory outpatient  Location: [facility] (AMB) Pain Management Facility  Visit type: Procedure  Specialty: Interventional Pain Management  Patient type: Established   Primary Reason for Visit: Interventional Pain Management Treatment. CC: Knee Pain (bilateral) and Hip Pain (Right)  Procedure:  Anesthesia, Analgesia, Anxiolysis:  Type: Palliative Intra-Articular Hyalgan Knee Injection #3 Region: Lateral infrapatellar Knee Region Level: Knee Joint Laterality: Bilateral  Type: Local Anesthesia Local Anesthetic: Lidocaine 1% Route: Infiltration ([place]/IM) IV Access: Declined Sedation: Declined  Indication(s): Analgesia          Indications: 1. Primary osteoarthritis of both knees   2. Chronic knee pain (Bilateral) (Location of Primary Source of Pain)    Pain Score: Pre-procedure: 4 /10 Post-procedure: 4 /10  Pre-Procedure Assessment:  Karen Lewis is a 45 y.o. (year old), female patient, seen today for interventional treatment. She  has a past surgical history that includes Laparoscopic partial gastrectomy; Shoulder arthroscopy (Right); Joint replacement (Left, hip); Carpal tunnel release (Bilateral); Diagnostic laparoscopy; Cholecystectomy; Trigger finger release (Right); and Thyroidectomy (N/A, 11/12/2015).. Her primarily concern today is the Knee Pain (bilateral) and Hip Pain (Right) The primary encounter diagnosis was Primary osteoarthritis of both knees. A diagnosis of Chronic knee pain (Bilateral) (Location of Primary Source of Pain) was also pertinent to this visit.  Pain Type: Chronic pain Pain Location: Knee (hip) Pain Orientation: Left, Right (right) Pain Descriptors / Indicators: Aching, Constant, Sharp, Burning Pain Frequency: Constant  Date of Last Visit: 08/09/16 Service Provided on  Last Visit: Procedure  Coagulation Parameters Lab Results  Component Value Date   INR 0.95 05/07/2015   LABPROT 12.9 05/07/2015   APTT 26 05/07/2015   PLT 337 11/12/2015   Verification of the correct person, correct site (including marking of site), and correct procedure were performed and confirmed by the patient.  Consent: Before the procedure and under the influence of no sedative(s), amnesic(s), or anxiolytics, the patient was informed of the treatment options, risks and possible complications. To fulfill our ethical and legal obligations, as recommended by the American Medical Association's Code of Ethics, I have informed the patient of my clinical impression; the nature and purpose of the treatment or procedure; the risks, benefits, and possible complications of the intervention; the alternatives, including doing nothing; the risk(s) and benefit(s) of the alternative treatment(s) or procedure(s); and the risk(s) and benefit(s) of doing nothing. The patient was provided information about the general risks and possible complications associated with the procedure. These may include, but are not limited to: failure to achieve desired goals, infection, bleeding, organ or nerve damage, allergic reactions, paralysis, and death. In addition, the patient was informed of those risks and complications associated to the procedure, such as failure to decrease pain; infection; bleeding; organ or nerve damage with subsequent damage to sensory, motor, and/or autonomic systems, resulting in permanent pain, numbness, and/or weakness of one or several areas of the body; allergic reactions; (i.e.: anaphylactic reaction); and/or death. Furthermore, the patient was informed of those risks and complications associated with the medications. These include, but are not limited to: allergic reactions (i.e.: anaphylactic or anaphylactoid reaction(s)); adrenal axis suppression; blood sugar elevation that in diabetics may  result in ketoacidosis or comma; water retention that in patients with history of congestive heart failure may result in shortness of breath, pulmonary edema,  and decompensation with resultant heart failure; weight gain; swelling or edema; medication-induced neural toxicity; particulate matter embolism and blood vessel occlusion with resultant organ, and/or nervous system infarction; and/or aseptic necrosis of one or more joints. Finally, the patient was informed that Medicine is not an exact science; therefore, there is also the possibility of unforeseen or unpredictable risks and/or possible complications that may result in a catastrophic outcome. The patient indicated having understood very clearly. We have given the patient no guarantees and we have made no promises. Enough time was given to the patient to ask questions, all of which were answered to the patient's satisfaction. Karen Lewis has indicated that she wanted to continue with the procedure.  Consent Attestation: I, the ordering provider, attest that I have discussed with the patient the benefits, risks, side-effects, alternatives, likelihood of achieving goals, and potential problems during recovery for the procedure that I have provided informed consent.  Pre-Procedure Preparation:  Safety Precautions: Allergies reviewed. The patient was asked about blood thinners, or active infections, both of which were denied. The patient was asked to confirm the procedure and laterality, before marking the site, and again before commencing the procedure. Appropriate site, procedure, and patient were confirmed by following the Joint Commission's Universal Protocol (UP.01.01.01), in the form of a "Time Out". The patient was asked to participate by confirming the accuracy of the "Time Out" information. Patient was assessed for positional comfort and pressure points before starting the procedure. Allergies: She is allergic to bactrim  [sulfamethoxazole-trimethoprim]; xolair [omalizumab]; aspirin; ciprofloxacin; motrin [ibuprofen]; sulfa antibiotics; clindamycin; and nsaids. Allergy Precautions: None required Infection Control Precautions: Sterile technique used. Standard Universal Precautions were taken as recommended by the Department of [facility] for Disease Control and Prevention (CDC). Standard pre-surgical skin prep was conducted. Respiratory hygiene and cough etiquette was practiced. Hand hygiene observed. Safe injection practices and needle disposal techniques followed. SDV (single dose vial) medications used. Medications properly checked for expiration dates and contaminants. Personal protective equipment (PPE) used as per protocol. Monitoring:  As per clinic protocol. Vitals:   08/23/16 0934 08/23/16 1050 08/23/16 1055 08/23/16 1057  BP: 135/89 (!) 144/101 (!) 149/111 (!) 148/87  Pulse: 61 89 87 88  Resp: 16 18 16 16   Temp: 98.4 F (36.9 C)     TempSrc: Oral     SpO2: 98% 98% 97% 100%  Weight: (!) 419 lb (190.1 kg)     Height: 5\' 4"  (1.626 m)     Calculated BMI: Body mass index is 71.92 kg/m. Time-out: "Time-out" completed before starting procedure, as per protocol.  Description of Procedure Process:   Time-out: "Time-out" completed before starting procedure, as per protocol. Position: Supine Target Area: Knee Joint Approach: Just above the Lateral tibial plateau, lateral to the infrapatellar tendon. Area Prepped: Entire knee area, from the mid-thigh to the mid-shin. Prepping solution: ChloraPrep (2% chlorhexidine gluconate and 70% isopropyl alcohol) Safety Precautions: Aspiration looking for blood return was conducted prior to all injections. At no point did we inject any substances, as a needle was being advanced. No attempts were made at seeking any paresthesias. Safe injection practices and needle disposal techniques used. Medications properly checked for expiration dates. SDV (single dose vial)  medications used. Description of the Procedure: Protocol guidelines were followed. The patient was placed in position over the fluoroscopy table. The target area was identified and the area prepped in the usual manner. Skin desensitized using vapocoolant spray. Skin & deeper tissues infiltrated with local anesthetic. Appropriate amount of  time allowed to pass for local anesthetics to take effect. The procedure needles were then advanced to the target area. Proper needle placement secured. Negative aspiration confirmed. Solution injected in intermittent fashion, asking for systemic symptoms every 0.5cc of injectate. The needles were then removed and the area cleansed, making sure to leave some of the prepping solution back to take advantage of its long term bactericidal properties. EBL: None Materials & Medications Used:  Needle(s) Used: 22g - 1.5" Needle(s) Medications Administered today: Please see chart for details.  Imaging Guidance:  Type of Imaging Technique: Fluoroscopy Guidance (Non-spinal) Indication(s): Assistance in needle guidance and placement for procedures requiring needle placement in or near specific anatomical locations not easily accessible without such assistance. Exposure Time: Please see nurses notes. Contrast: None used. Fluoroscopic Guidance: I was personally present during the use of fluoroscopy. "Tunnel Vision Technique" used to obtain the best possible view of the target area. Parallax error corrected before commencing the procedure. "Direction-depth-direction" technique used to introduce the needle under continuous pulsed fluoroscopy. Once target was reached, antero-posterior, oblique, and lateral fluoroscopic projection used confirm needle placement in all planes. Images permanently stored in EMR. Ultrasound Guidance: N/A Interpretation: No contrast injected. I personally interpreted the imaging intraoperatively. Adequate needle placement confirmed in multiple planes.  Permanent images saved into the patient's record.  Antibiotic Prophylaxis:  Indication(s): No indications identified. Type:  Antibiotics Given (last 72 hours)    None      Post-operative Assessment:  Complications: No immediate post-treatment complications observed by team, or reported by patient. Disposition: The patient tolerated the entire procedure well. A repeat set of vitals were taken after the procedure and the patient was kept under observation following institutional policy, for this type of procedure. Post-procedural neurological assessment was performed, showing return to baseline, prior to discharge. The patient was provided with post-procedure discharge instructions, including a section on how to identify potential problems. Should any problems arise concerning this procedure, the patient was given instructions to immediately contact us, at any time, without hesitation. In any case, we plan to contact the patient by telephone for a follow-up status report regarding this interventional procedure. Comments:  No additional relevant information.  Plan of Care  Discharge to: Discharge home  Medications ordered for procedure: Meds ordered this encounter  Medications  . Sodium Hyaluronate SOSY 2 mL  . lidocaine (PF) (XYLOCAINE) 1 % injection 10 mL  . ropivacaine (PF) 2 mg/mL (0.2%) (NAROPIN) injection 5 mL  . Sodium Hyaluronate SOSY 2 mL  . lidocaine (PF) (XYLOCAINE) 1 % injection    Willeen Cass L: cabinet override   Medications administered: (For more details, see medical record) Ms. Meroney had no medications administered during this visit. Lab-work, Procedure(s), & Referral(s) Ordered: Orders Placed This Encounter  Procedures  . KNEE INJECTION  . DG C-Arm 1-60 Min-No Report   Imaging Ordered: Results for orders placed in visit on 08/09/16  DG C-Arm 1-60 Min-No Report   Narrative Fluoroscopy was utilized by the requesting physician.  No radiographic  interpretation.     New Prescriptions   No medications on file   Physician-requested Follow-up:  Return if symptoms worsen or fail to improve, for previously scheduled appointment.  Future Appointments Date Time Provider [place]  11/18/2016 10:30 AM Milinda Pointer, MD [facility] None   Primary Care Physician: Idelle Crouch, MD Location: [facility] Outpatient Pain Management Facility Note by: Kathlen Brunswick. [name], M.D, DABA, DABAPM, DABPM, DABIPP, FIPP Date: 08/23/16; Time: 12:53 PM  Disclaimer:  Medicine  is not an Chief Strategy Officer. The only guarantee in medicine is that nothing is guaranteed. It is important to note that the decision to proceed with this intervention was based on the information collected from the patient. The Data and conclusions were drawn from the patient's questionnaire, the interview, and the physical examination. Because the information was provided in large part by the patient, it cannot be guaranteed that it has not been purposely or unconsciously manipulated. Every effort has been made to obtain as much relevant data as possible for this evaluation. It is important to note that the conclusions that lead to this procedure are derived in large part from the available data. Always take into account that the treatment will also be dependent on availability of resources and existing treatment guidelines, considered by other Pain Management Practitioners as being common knowledge and practice, at the time of the intervention. For Medico-Legal purposes, it is also important to point out that variation in procedural techniques and pharmacological choices are the acceptable norm. The indications, contraindications, technique, and results of the above procedure should only be interpreted and judged by a Board-Certified Interventional Pain Specialist with extensive familiarity and expertise in the same exact procedure and technique. Attempts at providing opinions without similar or greater  experience and expertise than that of the treating physician will be considered as inappropriate and unethical, and shall result in a formal complaint to the state medical board and applicable specialty societies.  Instructions provided at this appointment: Patient Instructions  Pain Management Discharge Instructions  General Discharge Instructions :  If you need to reach your doctor call: Monday-Friday 8:00 am - 4:00 pm at [phone] or toll free [phone].  After clinic hours [phone] to have operator reach doctor.  Bring all of your medication bottles to all your appointments in the pain clinic.  To cancel or reschedule your appointment with Pain Management please remember to call 24 hours in advance to avoid a fee.  Refer to the educational materials which you have been given on: General Risks, I had my Procedure. Discharge Instructions, Post Sedation.  Post Procedure Instructions:  The drugs you were given will stay in your system until tomorrow, so for the next 24 hours you should not drive, make any legal decisions or drink any alcoholic beverages.  You may eat anything you prefer, but it is better to start with liquids then soups and crackers, and gradually work up to solid foods.  Please notify your doctor immediately if you have any unusual bleeding, trouble breathing or pain that is not related to your normal pain.  Depending on the type of procedure that was done, some parts of your body may feel week and/or numb.  This usually clears up by tonight or the next day.  Walk with the use of an assistive device or accompanied by an adult for the 24 hours.  You may use ice on the affected area for the first 24 hours.  Put ice in a Ziploc bag and cover with a towel and place against area 15 minutes on 15 minutes off.  You may switch to heat after 24 hours.

## 2016-08-23 NOTE — Progress Notes (Signed)
Safety precautions to be maintained throughout the outpatient stay will include: orient to surroundings, keep bed in low position, maintain call bell within reach at all times, provide assistance with transfer out of bed and ambulation.  

## 2016-08-23 NOTE — Patient Instructions (Signed)

## 2016-08-24 DIAGNOSIS — R079 Chest pain, unspecified: Secondary | ICD-10-CM | POA: Diagnosis not present

## 2016-08-24 DIAGNOSIS — R0602 Shortness of breath: Secondary | ICD-10-CM | POA: Diagnosis not present

## 2016-08-24 NOTE — Telephone Encounter (Signed)
Denies any needs at this time. States knees are a little achy but doing fine. Instructed to call if needed.

## 2016-08-25 DIAGNOSIS — E7800 Pure hypercholesterolemia, unspecified: Secondary | ICD-10-CM | POA: Diagnosis not present

## 2016-08-25 DIAGNOSIS — M797 Fibromyalgia: Secondary | ICD-10-CM | POA: Diagnosis not present

## 2016-08-25 DIAGNOSIS — R9431 Abnormal electrocardiogram [ECG] [EKG]: Secondary | ICD-10-CM | POA: Diagnosis not present

## 2016-08-25 DIAGNOSIS — E119 Type 2 diabetes mellitus without complications: Secondary | ICD-10-CM | POA: Diagnosis not present

## 2016-08-25 DIAGNOSIS — R0602 Shortness of breath: Secondary | ICD-10-CM | POA: Diagnosis not present

## 2016-08-25 DIAGNOSIS — Z01810 Encounter for preprocedural cardiovascular examination: Secondary | ICD-10-CM | POA: Diagnosis not present

## 2016-08-25 DIAGNOSIS — I10 Essential (primary) hypertension: Secondary | ICD-10-CM | POA: Diagnosis not present

## 2016-08-25 DIAGNOSIS — G4733 Obstructive sleep apnea (adult) (pediatric): Secondary | ICD-10-CM | POA: Diagnosis not present

## 2016-08-30 DIAGNOSIS — Z9049 Acquired absence of other specified parts of digestive tract: Secondary | ICD-10-CM | POA: Diagnosis not present

## 2016-08-30 DIAGNOSIS — G8929 Other chronic pain: Secondary | ICD-10-CM | POA: Diagnosis not present

## 2016-08-30 DIAGNOSIS — M25551 Pain in right hip: Secondary | ICD-10-CM | POA: Diagnosis not present

## 2016-08-30 DIAGNOSIS — Z5189 Encounter for other specified aftercare: Secondary | ICD-10-CM | POA: Diagnosis not present

## 2016-08-30 DIAGNOSIS — M170 Bilateral primary osteoarthritis of knee: Secondary | ICD-10-CM | POA: Diagnosis not present

## 2016-08-30 DIAGNOSIS — Z9889 Other specified postprocedural states: Secondary | ICD-10-CM | POA: Diagnosis not present

## 2016-09-01 ENCOUNTER — Encounter: Payer: Commercial Managed Care - HMO | Admitting: Pain Medicine

## 2016-09-01 DIAGNOSIS — G4733 Obstructive sleep apnea (adult) (pediatric): Secondary | ICD-10-CM | POA: Diagnosis not present

## 2016-09-01 DIAGNOSIS — J4530 Mild persistent asthma, uncomplicated: Secondary | ICD-10-CM | POA: Diagnosis not present

## 2016-09-14 DIAGNOSIS — N760 Acute vaginitis: Secondary | ICD-10-CM | POA: Diagnosis not present

## 2016-09-14 DIAGNOSIS — B9689 Other specified bacterial agents as the cause of diseases classified elsewhere: Secondary | ICD-10-CM | POA: Diagnosis not present

## 2016-09-17 DIAGNOSIS — Z79899 Other long term (current) drug therapy: Secondary | ICD-10-CM | POA: Diagnosis not present

## 2016-09-17 DIAGNOSIS — E7800 Pure hypercholesterolemia, unspecified: Secondary | ICD-10-CM | POA: Diagnosis not present

## 2016-09-17 DIAGNOSIS — J45901 Unspecified asthma with (acute) exacerbation: Secondary | ICD-10-CM | POA: Diagnosis not present

## 2016-09-17 DIAGNOSIS — B373 Candidiasis of vulva and vagina: Secondary | ICD-10-CM | POA: Diagnosis not present

## 2016-09-17 DIAGNOSIS — M797 Fibromyalgia: Secondary | ICD-10-CM | POA: Diagnosis not present

## 2016-09-17 DIAGNOSIS — R0602 Shortness of breath: Secondary | ICD-10-CM | POA: Diagnosis not present

## 2016-09-17 DIAGNOSIS — I10 Essential (primary) hypertension: Secondary | ICD-10-CM | POA: Diagnosis not present

## 2016-09-17 DIAGNOSIS — J069 Acute upper respiratory infection, unspecified: Secondary | ICD-10-CM | POA: Diagnosis not present

## 2016-10-15 ENCOUNTER — Ambulatory Visit: Admission: RE | Admit: 2016-10-15 | Payer: Medicare HMO | Source: Ambulatory Visit

## 2016-10-15 ENCOUNTER — Other Ambulatory Visit: Payer: Self-pay | Admitting: Physician Assistant

## 2016-10-15 DIAGNOSIS — R300 Dysuria: Secondary | ICD-10-CM | POA: Diagnosis not present

## 2016-10-15 DIAGNOSIS — M7989 Other specified soft tissue disorders: Secondary | ICD-10-CM | POA: Diagnosis not present

## 2016-10-15 DIAGNOSIS — R1031 Right lower quadrant pain: Secondary | ICD-10-CM | POA: Diagnosis not present

## 2016-10-15 DIAGNOSIS — R600 Localized edema: Secondary | ICD-10-CM | POA: Diagnosis not present

## 2016-10-15 DIAGNOSIS — M79604 Pain in right leg: Secondary | ICD-10-CM | POA: Diagnosis not present

## 2016-10-15 DIAGNOSIS — M79601 Pain in right arm: Secondary | ICD-10-CM | POA: Diagnosis not present

## 2016-10-15 DIAGNOSIS — E119 Type 2 diabetes mellitus without complications: Secondary | ICD-10-CM | POA: Diagnosis not present

## 2016-10-21 DIAGNOSIS — Z79899 Other long term (current) drug therapy: Secondary | ICD-10-CM | POA: Diagnosis not present

## 2016-10-21 DIAGNOSIS — B370 Candidal stomatitis: Secondary | ICD-10-CM | POA: Diagnosis not present

## 2016-10-21 DIAGNOSIS — L03311 Cellulitis of abdominal wall: Secondary | ICD-10-CM | POA: Diagnosis not present

## 2016-10-21 DIAGNOSIS — N390 Urinary tract infection, site not specified: Secondary | ICD-10-CM | POA: Diagnosis not present

## 2016-10-21 DIAGNOSIS — R197 Diarrhea, unspecified: Secondary | ICD-10-CM | POA: Diagnosis not present

## 2016-10-22 DIAGNOSIS — M25551 Pain in right hip: Secondary | ICD-10-CM | POA: Diagnosis not present

## 2016-10-22 DIAGNOSIS — M25542 Pain in joints of left hand: Secondary | ICD-10-CM | POA: Diagnosis not present

## 2016-11-15 DIAGNOSIS — E049 Nontoxic goiter, unspecified: Secondary | ICD-10-CM | POA: Diagnosis not present

## 2016-11-15 DIAGNOSIS — Z79899 Other long term (current) drug therapy: Secondary | ICD-10-CM | POA: Diagnosis not present

## 2016-11-15 DIAGNOSIS — E119 Type 2 diabetes mellitus without complications: Secondary | ICD-10-CM | POA: Diagnosis not present

## 2016-11-15 DIAGNOSIS — E890 Postprocedural hypothyroidism: Secondary | ICD-10-CM | POA: Diagnosis not present

## 2016-11-15 DIAGNOSIS — Z9884 Bariatric surgery status: Secondary | ICD-10-CM | POA: Diagnosis not present

## 2016-11-15 DIAGNOSIS — E538 Deficiency of other specified B group vitamins: Secondary | ICD-10-CM | POA: Diagnosis not present

## 2016-11-15 DIAGNOSIS — E282 Polycystic ovarian syndrome: Secondary | ICD-10-CM | POA: Diagnosis not present

## 2016-11-15 DIAGNOSIS — R350 Frequency of micturition: Secondary | ICD-10-CM | POA: Diagnosis not present

## 2016-11-15 DIAGNOSIS — E559 Vitamin D deficiency, unspecified: Secondary | ICD-10-CM | POA: Diagnosis not present

## 2016-11-16 DIAGNOSIS — R197 Diarrhea, unspecified: Secondary | ICD-10-CM | POA: Diagnosis not present

## 2016-11-17 DIAGNOSIS — M65312 Trigger thumb, left thumb: Secondary | ICD-10-CM | POA: Diagnosis not present

## 2016-11-18 ENCOUNTER — Ambulatory Visit: Payer: Medicare HMO | Admitting: Pain Medicine

## 2016-12-02 ENCOUNTER — Ambulatory Visit: Payer: Medicare HMO | Attending: Pain Medicine | Admitting: Pain Medicine

## 2016-12-02 ENCOUNTER — Encounter: Payer: Self-pay | Admitting: Pain Medicine

## 2016-12-02 VITALS — BP 154/78 | HR 103 | Temp 98.7°F | Resp 22 | Ht 64.0 in | Wt >= 6400 oz

## 2016-12-02 DIAGNOSIS — Z79891 Long term (current) use of opiate analgesic: Secondary | ICD-10-CM | POA: Diagnosis not present

## 2016-12-02 DIAGNOSIS — Z79899 Other long term (current) drug therapy: Secondary | ICD-10-CM | POA: Insufficient documentation

## 2016-12-02 DIAGNOSIS — I10 Essential (primary) hypertension: Secondary | ICD-10-CM | POA: Diagnosis not present

## 2016-12-02 DIAGNOSIS — G894 Chronic pain syndrome: Secondary | ICD-10-CM | POA: Diagnosis not present

## 2016-12-02 DIAGNOSIS — M797 Fibromyalgia: Secondary | ICD-10-CM

## 2016-12-02 DIAGNOSIS — Z5181 Encounter for therapeutic drug level monitoring: Secondary | ICD-10-CM | POA: Diagnosis not present

## 2016-12-02 DIAGNOSIS — M47816 Spondylosis without myelopathy or radiculopathy, lumbar region: Secondary | ICD-10-CM | POA: Diagnosis not present

## 2016-12-02 DIAGNOSIS — Z794 Long term (current) use of insulin: Secondary | ICD-10-CM | POA: Diagnosis not present

## 2016-12-02 DIAGNOSIS — Z96642 Presence of left artificial hip joint: Secondary | ICD-10-CM | POA: Insufficient documentation

## 2016-12-02 DIAGNOSIS — M25562 Pain in left knee: Secondary | ICD-10-CM | POA: Diagnosis not present

## 2016-12-02 DIAGNOSIS — K219 Gastro-esophageal reflux disease without esophagitis: Secondary | ICD-10-CM | POA: Insufficient documentation

## 2016-12-02 DIAGNOSIS — G8929 Other chronic pain: Secondary | ICD-10-CM

## 2016-12-02 DIAGNOSIS — M25561 Pain in right knee: Secondary | ICD-10-CM

## 2016-12-02 DIAGNOSIS — M170 Bilateral primary osteoarthritis of knee: Secondary | ICD-10-CM | POA: Diagnosis not present

## 2016-12-02 DIAGNOSIS — M549 Dorsalgia, unspecified: Secondary | ICD-10-CM | POA: Insufficient documentation

## 2016-12-02 DIAGNOSIS — M545 Low back pain: Secondary | ICD-10-CM | POA: Diagnosis not present

## 2016-12-02 DIAGNOSIS — M792 Neuralgia and neuritis, unspecified: Secondary | ICD-10-CM | POA: Insufficient documentation

## 2016-12-02 DIAGNOSIS — E119 Type 2 diabetes mellitus without complications: Secondary | ICD-10-CM | POA: Insufficient documentation

## 2016-12-02 DIAGNOSIS — J45909 Unspecified asthma, uncomplicated: Secondary | ICD-10-CM | POA: Insufficient documentation

## 2016-12-02 DIAGNOSIS — Z6841 Body Mass Index (BMI) 40.0 and over, adult: Secondary | ICD-10-CM | POA: Diagnosis not present

## 2016-12-02 DIAGNOSIS — Z9884 Bariatric surgery status: Secondary | ICD-10-CM | POA: Diagnosis not present

## 2016-12-02 DIAGNOSIS — Z8614 Personal history of Methicillin resistant Staphylococcus aureus infection: Secondary | ICD-10-CM | POA: Diagnosis not present

## 2016-12-02 DIAGNOSIS — M1288 Other specific arthropathies, not elsewhere classified, other specified site: Secondary | ICD-10-CM

## 2016-12-02 DIAGNOSIS — F1190 Opioid use, unspecified, uncomplicated: Secondary | ICD-10-CM

## 2016-12-02 DIAGNOSIS — M25551 Pain in right hip: Secondary | ICD-10-CM | POA: Diagnosis not present

## 2016-12-02 MED ORDER — OXYCODONE HCL 5 MG PO TABS: 5.0000 mg | ORAL_TABLET | Freq: Every day | ORAL | 0 refills | Status: DC | PRN

## 2016-12-02 MED ORDER — GABAPENTIN 800 MG PO TABS
800.0000 mg | ORAL_TABLET | Freq: Four times a day (QID) | ORAL | 0 refills | Status: DC
Start: 2016-12-04 — End: 2017-05-18

## 2016-12-02 NOTE — Patient Instructions (Addendum)
Pain Score  Introduction: The pain score used by this practice is the Verbal Numerical Rating Scale (VNRS-11). This is an 11-point scale. It is for adults and children 10 years or older. There are significant differences in how the pain score is reported, used, and applied. Forget everything you learned in the past and learn this scoring system.  General Information: The scale should reflect your current level of pain. Unless you are specifically asked for the level of your worst pain, or your average pain. If you are asked for one of these two, then it should be understood that it is over the past 24 hours.  Basic Activities of Daily Living (ADL): Personal hygiene, dressing, eating, transferring, and using restroom.  Instructions: Most patients tend to report their level of pain as a combination of two factors, their physical pain and their psychosocial pain. This last one is also known as "suffering" and it is reflection of how physical pain affects you socially and psychologically. From now on, report them separately. From this point on, when asked to report your pain level, report only your physical pain. Use the following table for reference.  Pain Clinic Pain Levels (0-5/10)  Pain Level Score Description  No Pain 0   Mild pain 1 Nagging, annoying, but does not interfere with basic activities of daily living (ADL). Patients are able to eat, bathe, get dressed, toileting (being able to get on and off the toilet and perform personal hygiene functions), transfer (move in and out of bed or a chair without assistance), and maintain continence (able to control bladder and bowel functions). Blood pressure and heart rate are unaffected. A normal heart rate for a healthy adult ranges from 60 to 100 bpm (beats per minute).   Mild to moderate pain 2 Noticeable and distracting. Impossible to hide from other people. More frequent flare-ups. Still possible to adapt and function close to normal. It can be very  annoying and may have occasional stronger flare-ups. With discipline, patients may get used to it and adapt.   Moderate pain 3 Interferes significantly with activities of daily living (ADL). It becomes difficult to feed, bathe, get dressed, get on and off the toilet or to perform personal hygiene functions. Difficult to get in and out of bed or a chair without assistance. Very distracting. With effort, it can be ignored when deeply involved in activities.   Moderately severe pain 4 Impossible to ignore for more than a few minutes. With effort, patients may still be able to manage work or participate in some social activities. Very difficult to concentrate. Signs of autonomic nervous system discharge are evident: dilated pupils (mydriasis); mild sweating (diaphoresis); sleep interference. Heart rate becomes elevated (>115 bpm). Diastolic blood pressure (lower number) rises above 100 mmHg. Patients find relief in laying down and not moving.   Severe pain 5 Intense and extremely unpleasant. Associated with frowning face and frequent crying. Pain overwhelms the senses.  Ability to do any activity or maintain social relationships becomes significantly limited. Conversation becomes difficult. Pacing back and forth is common, as getting into a comfortable position is nearly impossible. Pain wakes you up from deep sleep. Physical signs will be obvious: pupillary dilation; increased sweating; goosebumps; brisk reflexes; cold, clammy hands and feet; nausea, vomiting or dry heaves; loss of appetite; significant sleep disturbance with inability to fall asleep or to remain asleep. When persistent, significant weight loss is observed due to the complete loss of appetite and sleep deprivation.  Blood pressure and heart   rate becomes significantly elevated. Caution: If elevated blood pressure triggers a pounding headache associated with blurred vision, then the patient should immediately seek attention at an urgent or  emergency care unit, as these may be signs of an impending stroke.    Emergency Department Pain Levels (6-10/10)  Emergency Room Pain 6 Severely limiting. Requires emergency care and should not be seen or managed at an outpatient pain management facility. Communication becomes difficult and requires great effort. Assistance to reach the emergency department may be required. Facial flushing and profuse sweating along with potentially dangerous increases in heart rate and blood pressure will be evident.   Distressing pain 7 Self-care is very difficult. Assistance is required to transport, or use restroom. Assistance to reach the emergency department will be required. Tasks requiring coordination, such as bathing and getting dressed become very difficult.   Disabling pain 8 Self-care is no longer possible. At this level, pain is disabling. The individual is unable to do even the most "basic" activities such as walking, eating, bathing, dressing, transferring to a bed, or toileting. Fine motor skills are lost. It is difficult to think clearly.   Incapacitating pain 9 Pain becomes incapacitating. Thought processing is no longer possible. Difficult to remember your own name. Control of movement and coordination are lost.   The worst pain imaginable 10 At this level, most patients pass out from pain. When this level is reached, collapse of the autonomic nervous system occurs, leading to a sudden drop in blood pressure and heart rate. This in turn results in a temporary and dramatic drop in blood flow to the brain, leading to a loss of consciousness. Fainting is one of the body's self defense mechanisms. Passing out puts the brain in a calmed state and causes it to shut down for a while, in order to begin the healing process.    Summary: 1. Refer to this scale when providing [place] with your pain level. 2. Be accurate and careful when reporting your pain level. This will help with your care. 3. Over-reporting  your pain level will lead to loss of credibility. 4. Even a level of 1/10 means that there is pain and will be treated at our facility. 5. High, inaccurate reporting will be documented as "Symptom Exaggeration", leading to loss of credibility and suspicions of possible secondary gains such as obtaining more narcotics, or wanting to appear disabled, for fraudulent reasons. 6. Only pain levels of 5 or below will be seen at our facility. 7. Pain levels of 6 and above will be sent to the Emergency Department and the appointment cancelled.  You were given 3 prescriptions for Oxycodone today. A prescriptions for Gabapentin was sent to your pharmacy. _____________________________________________________________________________________________

## 2016-12-02 NOTE — Progress Notes (Signed)
Nursing Pain Medication Assessment:  Safety precautions to be maintained throughout the outpatient stay will include: orient to surroundings, keep bed in low position, maintain call bell within reach at all times, provide assistance with transfer out of bed and ambulation.  Medication Inspection Compliance: Pill count conducted under aseptic conditions, in front of the patient. Neither the pills nor the bottle was removed from the patient's sight at any time. Once count was completed pills were immediately returned to the patient in their original bottle.  Medication: Oxycodone/APAP Pill/Patch Count: 11 of 120 pills remain Bottle Appearance: Standard pharmacy container. Clearly labeled. Filled Date02 / 05 / 2018 Last Medication intake:  Today

## 2016-12-02 NOTE — Progress Notes (Signed)
Patient's Name: Karen Lewis  MRN: [id number]  Referring Provider: Idelle Crouch, MD  DOB: 1970/09/20  PCP: Idelle Crouch, MD  DOS: 12/02/2016  Note by: Kathlen Brunswick. [name], MD  Service setting: Ambulatory outpatient  Specialty: Interventional Pain Management  Location: [facility] (AMB) Pain Management Facility    Patient type: Established   Primary Reason(s) for Visit: Encounter for prescription drug management & post-procedure evaluation of chronic illness with mild to moderate exacerbation(Level of risk: moderate) CC: Back Pain (low); Hip Pain (right); and Knee Pain (right)  HPI  Karen Lewis is a 46 y.o. year old, female patient, who comes today for a post-procedure evaluation and medication management. She has History of asthma; Binge eating disorder; Fibromyalgia; Anxiety, generalized; Insomnia, persistent; Depression, major, recurrent, moderate ([place]); Airway hyperreactivity; Dermatitis, eczematoid; Gout; Hyperlipidemia; Headache, migraine; Obstructive apnea; Bilateral polycystic ovarian syndrome; Apnea, sleep; Disease of thyroid gland; Avitaminosis D; History of surgical procedure; Type 2 diabetes mellitus ([place]); Long term current use of opiate analgesic; Long term prescription opiate use; Opiate use; Encounter for therapeutic drug level monitoring; Opiate dependence ([place]); Essential (primary) hypertension; Goiter, nontoxic, multinodular; Chronic knee pain (Location of Primary Source of Pain) (Bilateral) (R>L); Chronic low back pain (Location of Tertiary source of pain) (Bilateral) (R>L); Lumbar facet syndrome (Bilateral) (R>L); Osteoarthritis; Grade 1 (1.4 cm) Anterolisthesis of L4 over L5; Chronic hip pain (Location of Secondary source of pain) (Right); S/P THR (total hip replacement) (Left); History of methicillin resistant staphylococcus aureus (MRSA); History of bariatric surgery; Hypomagnesemia; Morbid obesity with BMI of 70 and over, adult ([place]) (71.75 on 08/27/2015); History of methicillin  resistant Staphylococcus aureus infection; Lumbar spondylosis; Vitamin D insufficiency; Dysphagia; Gastroesophageal reflux disease without esophagitis; Osteoarthritis of hip (Right); Chest pain with low risk of acute coronary syndrome; Nontoxic goiter; Other psychoactive substance dependence, uncomplicated (HCC); SOB (shortness of breath) on exertion; Chronic pain syndrome; Osteoarthritis of knee (Bilateral) (R>L); and Neurogenic pain on her problem list. Her primarily concern today is the Back Pain (low); Hip Pain (right); and Knee Pain (right)  Pain Assessment: Self-Reported Pain Score: 7 /10 Clinically the patient looks like a 2/10 Reported level is inconsistent with clinical observations. Information on the proper use of the pain scale provided to the patient today Pain Type: Chronic pain Pain Location: Back (hip, knee) Pain Orientation: Right Pain Descriptors / Indicators: Aching, Sharp, Stabbing, Shooting, Burning, Dull, Throbbing, Numbness, Tingling Pain Frequency: Constant  Karen Lewis was last seen on 08/23/2016 for a procedure. During today's appointment we reviewed Karen Lewis's post-procedure results, as well as her outpatient medication regimen.  Further details on both, my assessment(s), as well as the proposed treatment plan, please see below.  Controlled Substance Pharmacotherapy Assessment REMS (Risk Evaluation and Mitigation Strategy)  Analgesic:Oxycodone/APAP 5/325 one every 6 hours when necessary for pain (20 mg/day) MME/day:30 mg/day.  Zenovia Jarred, RN  12/02/2016 10:19 AM  Signed Nursing Pain Medication Assessment:  Safety precautions to be maintained throughout the outpatient stay will include: orient to surroundings, keep bed in low position, maintain call bell within reach at all times, provide assistance with transfer out of bed and ambulation.  Medication Inspection Compliance: Pill count conducted under aseptic conditions, in front of the patient. Neither the pills nor the  bottle was removed from the patient's sight at any time. Once count was completed pills were immediately returned to the patient in their original bottle.  Medication: Oxycodone/APAP Pill/Patch Count: 11 of 120 pills remain Bottle Appearance: Standard pharmacy container. Clearly labeled. Filled Date02 /  05 / 2018 Last Medication intake:  Today   Pharmacokinetics: Liberation and absorption (onset of action): WNL Distribution (time to peak effect): WNL Metabolism and excretion (duration of action): WNL         Pharmacodynamics: Desired effects: Analgesia: Karen Lewis reports >50% benefit. Functional ability: Patient reports that medication allows her to accomplish basic ADLs Clinically meaningful improvement in function (CMIF): Sustained CMIF goals met Perceived effectiveness: Described as relatively effective, allowing for increase in activities of daily living (ADL) Undesirable effects: Side-effects or Adverse reactions: None reported Monitoring: [place] PMP: Online review of the past 66-monthperiod conducted. Compliant with practice rules and regulations List of all UDS test(s) done:  Lab Results  Component Value Date   TOXASSSELUR FINAL 02/23/2016   TOXASSSELUR FINAL 10/02/2015   TOXASSSELUR FINAL 08/27/2015   TOXASSSELUR FINAL 07/29/2015   Last UDS on record: ToxAssure Select 13  Date Value Ref Range Status  02/23/2016 FINAL  Final    Comment:    ==================================================================== TOXASSURE SELECT 13 (MW) ==================================================================== Test                             Result       Flag       Units Drug Present and Declared for Prescription Verification   Amphetamine                    >3676        EXPECTED   ng/mg creat    Amphetamine is available as a schedule II prescription drug.   Oxycodone                      401          EXPECTED   ng/mg creat   Oxymorphone                    31           EXPECTED    ng/mg creat   Noroxycodone                   2608         EXPECTED   ng/mg creat    Sources of oxycodone include scheduled prescription medications.    Oxymorphone and noroxycodone are expected metabolites of    oxycodone. Oxymorphone is also available as a scheduled    prescription medication.   Butalbital                     PRESENT      EXPECTED Drug Present not Declared for Prescription Verification   Alcohol, Ethyl                 0.026        UNEXPECTED g/dL    Sources of ethyl alcohol include alcoholic beverages or as a    fermentation product of glucose; glucose was not detected in this    specimen. Ethyl alcohol result should be interpreted in the    context of all available clinical and behavioral information. Drug Absent but Declared for Prescription Verification   Clonazepam                     Not Detected UNEXPECTED ng/mg creat ==================================================================== Test                      Result  Flag   Units      Ref Range   Creatinine              272              mg/dL      >=20 ==================================================================== Declared Medications:  The flagging and interpretation on this report are based on the  following declared medications.  Unexpected results may arise from  inaccuracies in the declared medications.  **Note: The testing scope of this panel includes these medications:  Amphetamine (Vyvanse)  Butalbital (Esgic)  Butalbital (Fioricet)  Clonazepam (Klonopin)  Oxycodone (Percocet)  Oxycodone (Roxicet)  **Note: The testing scope of this panel does not include following  reported medications:  Acetaminophen (Esgic)  Acetaminophen (Fioricet)  Acetaminophen (Percocet)  Acetaminophen (Roxicet)  Acyclovir  Albuterol  Albuterol (Duoneb)  Amitriptyline (Elavil)  Atorvastatin (Lipitor)  Epinephrine  Fluconazole (Diflucan)  Fluticasone (Advair)  Gabapentin  Ipratropium (Duoneb)  Ketoconazole   Levocetirizine (Xyzal)  Levothyroxine  Magnesium (Mag-Ox)  Meclizine  Medroxyprogesterone (Depo-Provera)  Metoprolol (Lopressor)  Montelukast (Singulair)  Multivitamin  Nystatin  Omeprazole  Quetiapine (Seroquel)  Salmeterol (Advair)  Sertraline (Zoloft)  Spironolactone (Aldactone)  Supplement (OsCal)  Tiotropium (Spiriva)  Tizanidine (Zanaflex)  Triamcinolone (Nasacort)  Vitamin D2 (Ergocalciferol)  Vitamin D3  Zolpidem (Ambien)  Zonisamide ==================================================================== For clinical consultation, please call [phone]. ====================================================================    UDS interpretation: Compliant          Medication Assessment Form: Reviewed. Patient indicates being compliant with therapy Treatment compliance: Compliant Risk Assessment Profile: Aberrant behavior: See prior evaluations. None observed or detected today Comorbid factors increasing risk of overdose: See prior notes. No additional risks detected today Risk of substance use disorder (SUD): Low Opioid Risk Tool (ORT) Total Score: 4  Interpretation Table:  Score <3 = Low Risk for SUD  Score between 4-7 = Moderate Risk for SUD  Score >8 = High Risk for Opioid Abuse   Risk Mitigation Strategies:  Patient Counseling: Covered Patient-Prescriber Agreement (PPA): Present and active  Notification to other healthcare providers: Done  Pharmacologic Plan: No change in therapy, at this time  Post-Procedure Assessment  08/23/2016 Procedure: Bilateral intra-articular Hyalgan knee injection #3 Pre-procedure pain score:  4/10 Post-procedure pain score: 4/10 No relief Influential Factors: BMI: 73.81 kg/m Intra-procedural challenges: None observed Assessment challenges: None detected         Post-procedural side-effects, adverse reactions, or complications: None reported Reported issues: None  Sedation: No sedation used. When no sedatives are used,  the analgesic levels obtained are directly associated to the effectiveness of the local anesthetics. However, when sedation is provided, the level of analgesia obtained during the initial 1 hour following the intervention, is believed to be the result of a combination of factors. These factors may include, but are not limited to: 1. The effectiveness of the local anesthetics used. 2. The effects of the analgesic(s) and/or anxiolytic(s) used. 3. The degree of discomfort experienced by the patient at the time of the procedure. 4. The patients ability and reliability in recalling and recording the events. 5. The presence and influence of possible secondary gains and/or psychosocial factors. Reported result: Relief experienced during the 1st hour after the procedure: 100 % (Ultra-Short Term Relief) Interpretative annotation: Analgesia during this period is likely to be Local Anesthetic and/or IV Sedative (Analgesic/Anxiolitic) related.          Effects of local anesthetic: The analgesic effects attained during this period are directly associated to the localized infiltration of local  anesthetics and therefore cary significant diagnostic value as to the etiological location, or anatomical origin, of the pain. Expected duration of relief is directly dependent on the pharmacodynamics of the local anesthetic used. Long-acting (4-6 hours) anesthetics used.  Reported result: Relief during the next 4 to 6 hour after the procedure: 100 % (Short-Term Relief) Interpretative annotation: Complete relief would suggest area to be the source of the pain.          Long-term benefit: Defined as the period of time past the expected duration of local anesthetics. With the possible exception of prolonged sympathetic blockade from the local anesthetics, benefits during this period are typically attributed to, or associated with, other factors such as analgesic sensory neuropraxia, antiinflammatory effects, or beneficial  biochemical changes provided by agents other than the local anesthetics Reported result: Extended relief following procedure: 60 % (ongoing, but is starting to wear off now, right is worse  ) (Long-Term Relief) Interpretative annotation: Good relief. This could suggest inflammation to be a significant component in the etiology to the pain.          Current benefits: Defined as persistent relief that continues at this point in time.   Reported results: Treated area: 60 % Karen Lewis reports improvement in function Interpretative annotation: Ongoing benefits would suggest effective therapeutic approach  Interpretation: Results would suggest a successful diagnostic intervention.          Laboratory Chemistry  Inflammation Markers Lab Results  Component Value Date   CRP <0.5 11/24/2015   ESRSEDRATE 36 (H) 11/24/2015   (CRP: Acute Phase) (ESR: Chronic Phase) Renal Function Markers Lab Results  Component Value Date   BUN 8 11/24/2015   CREATININE 0.80 05/11/2016   GFRAA >60 11/24/2015   GFRNONAA >60 11/24/2015   Hepatic Function Markers Lab Results  Component Value Date   AST 23 11/24/2015   ALT 20 11/24/2015   ALBUMIN 3.7 11/24/2015   ALKPHOS 81 11/24/2015   Electrolytes Lab Results  Component Value Date   NA 139 11/24/2015   K 4.3 11/24/2015   CL 105 11/24/2015   CALCIUM 8.6 (L) 11/24/2015   MG 1.6 (L) 11/24/2015   Neuropathy Markers Lab Results  Component Value Date   VITAMINB12 303 11/24/2015   Bone Pathology Markers Lab Results  Component Value Date   ALKPHOS 81 11/24/2015   VD25OH 27.4 (L) 11/24/2015   VD125OH2TOT 41.5 11/24/2015   CALCIUM 8.6 (L) 11/24/2015   Coagulation Parameters Lab Results  Component Value Date   INR 0.95 05/07/2015   LABPROT 12.9 05/07/2015   APTT 26 05/07/2015   PLT 337 11/12/2015   Cardiovascular Markers Lab Results  Component Value Date   HGB 12.4 11/12/2015   HCT 38.0 11/12/2015   Note: Lab results reviewed.  Recent  Diagnostic Imaging Review  Dg C-arm 1-60 Min-no Report  Result Date: 08/23/2016 There is no Radiologist interpretation  for this exam.  Note: Imaging results reviewed.          Meds  The patient has a current medication list which includes the following prescription(s): acetaminophen, acyclovir, albuterol, amitriptyline, azelastine, accu-chek aviva, accu-chek aviva plus, fifty50 glucose meter 2.0, butalbital-acetaminophen-caffeine, gabapentin, glipizide, guaifenesin-codeine, insulin pen needle, ipratropium-albuterol, ketoconazole, levocetirizine, levothyroxine, levothyroxine, levothyroxine, magnesium oxide, meclizine, medroxyprogesterone, metformin, metoprolol, montelukast, multivitamin with minerals, omeprazole, oxycodone, oxycodone, oxycodone, quetiapine, ranitidine, rosuvastatin, sertraline, spironolactone, sucralfate, tizanidine, triamcinolone, triamcinolone, true metrix blood glucose test, vitamin b-12, vitamin d (ergocalciferol), and zonisamide.  Current Outpatient Prescriptions on File Prior to Visit  Medication  Sig  . Acetaminophen 500 MG coapsule Take 500 mg by mouth every 6 (six) hours as needed.   Marland Kitchen acyclovir (ZOVIRAX) 800 MG tablet Take 800 mg by mouth 3 times/day as needed-between meals & bedtime. Reported on 11/12/2015  . albuterol (PROVENTIL HFA;VENTOLIN HFA) 108 (90 BASE) MCG/ACT inhaler Inhale 2 puffs into the lungs every 6 (six) hours as needed for wheezing or shortness of breath.  Marland Kitchen amitriptyline (ELAVIL) 100 MG tablet Take 100 mg by mouth at bedtime.   Marland Kitchen azelastine (ASTELIN) 0.1 % nasal spray Place 1 spray into the nose 2 (two) times daily.   . Blood Glucose Calibration (ACCU-CHEK AVIVA) SOLN   . Blood Glucose Monitoring Suppl (ACCU-CHEK AVIVA PLUS) w/Device KIT   . Blood Glucose Monitoring Suppl (FIFTY50 GLUCOSE METER 2.0) w/Device KIT Use as directed. E11.0--ACCU CHEK AVIVA  . butalbital-acetaminophen-caffeine (FIORICET, ESGIC) 50-325-40 MG tablet 1 tablet every 6 (six) hours  as needed.   Marland Kitchen ipratropium-albuterol (DUONEB) 0.5-2.5 (3) MG/3ML SOLN 3 mLs as needed.   Marland Kitchen ketoconazole (NIZORAL) 2 % cream APPLY AS DIRECTED TO AFFECTED AREA TWICE DAILY AS NEEDED  . levocetirizine (XYZAL) 5 MG tablet Take 5 mg by mouth every evening.   Marland Kitchen levothyroxine (SYNTHROID, LEVOTHROID) 100 MCG tablet Take on an empty stomach each morning before breakfast. Total dose 225 mcg daily.  . magnesium oxide (MAG-OX) 400 MG tablet Take 1 tablet (400 mg total) by mouth 2 (two) times daily.  . meclizine (ANTIVERT) 25 MG tablet Take 1 tablet (25 mg total) by mouth 3 (three) times daily as needed for dizziness.  . medroxyPROGESTERone (DEPO-PROVERA) 150 MG/ML injection Inject 150 mg into the muscle every 3 (three) months.   . metFORMIN (GLUCOPHAGE) 1000 MG tablet Take 1,000 mg by mouth 2 (two) times daily with a meal.   . metoprolol (LOPRESSOR) 50 MG tablet Take 75 mg by mouth 2 (two) times daily.   . montelukast (SINGULAIR) 10 MG tablet Take 10 mg by mouth at bedtime.   . Multiple Vitamin (MULTIVITAMIN WITH MINERALS) TABS tablet Take 2 tablets by mouth daily. Reported on 11/12/2015  . omeprazole (PRILOSEC) 40 MG capsule Take 40 mg by mouth 2 (two) times daily.   . QUEtiapine (SEROQUEL) 100 MG tablet Take 1 tablet (100 mg total) by mouth at bedtime.  . ranitidine (ZANTAC) 150 MG capsule Take 150 mg by mouth every evening.   . sertraline (ZOLOFT) 100 MG tablet Take 2 tablets (200 mg total) by mouth daily.  Marland Kitchen spironolactone (ALDACTONE) 25 MG tablet Take 37.5 mg by mouth 2 (two) times daily.   . sucralfate (CARAFATE) 1 g tablet Take 2 g by mouth 2 (two) times daily.   Marland Kitchen tiZANidine (ZANAFLEX) 4 MG tablet 4 mg at bedtime.   . triamcinolone (KENALOG) 0.1 % paste Apply 1 application topically 2 (two) times daily.   Marland Kitchen triamcinolone (NASACORT AQ) 55 MCG/ACT AERO nasal inhaler 2 sprays by Nasal route 2 (two) times daily.  . TRUE METRIX BLOOD GLUCOSE TEST test strip   . Vitamin D, Ergocalciferol, (DRISDOL) 50000  units CAPS capsule Take 1 capsule (50,000 Units total) by mouth 2 (two) times a week. X 6 weeks.  Marland Kitchen zonisamide (ZONEGRAN) 50 MG capsule Take 150 mg by mouth daily. 3 tabs at bedtime   No current facility-administered medications on file prior to visit.    ROS  Constitutional: Denies any fever or chills Gastrointestinal: No reported hemesis, hematochezia, vomiting, or acute GI distress Musculoskeletal: Denies any acute onset joint swelling, redness, loss  of ROM, or weakness Neurological: No reported episodes of acute onset apraxia, aphasia, dysarthria, agnosia, amnesia, paralysis, loss of coordination, or loss of consciousness  Allergies  Karen Lewis is allergic to bactrim [sulfamethoxazole-trimethoprim]; xolair [omalizumab]; ciprofloxacin; sulfa antibiotics; aspirin; clindamycin; motrin [ibuprofen]; nsaids; and other.  Karen Lewis  Drug: Karen Lewis  reports that she does not use drugs. Alcohol:  reports that she does not drink alcohol. Tobacco:  reports that she has never smoked. She has never used smokeless tobacco. Medical:  has a past medical history of Anemia; Anginal pain ([place]); Anxiety; Arthralgia of hip (07/29/2015); Arthritis; Arthritis, degenerative (07/29/2015); Asthma; Cephalalgia (07/25/2014); Dependence on unknown drug ([place]); Depression; Diabetes mellitus without complication ([place]); Dysrhythmia; Eczema; Fibromyalgia; Gastritis; GERD (gastroesophageal reflux disease); Gonalgia (07/29/2015); Gout; H/O cardiovascular disorder (03/10/2015); H/O surgical procedure (12/05/2012); H/O thyroid disease (03/10/2015); Headache; Herpes; History of artificial joint (07/29/2015); History of hiatal hernia; Hypertension; Hypomagnesemia; Hypothyroidism; LBP (low back pain) (07/29/2015); Neuromuscular disorder ([place]); Obesity; PCOS (polycystic ovarian syndrome); Primary osteoarthritis of both knees (07/29/2015); Sleep apnea; and Thyroid nodule (bilateral). Family: family history includes Alcohol abuse in her father and  mother; Anxiety disorder in her father and mother; Breast cancer in her paternal aunt; COPD in her father; Depression in her brother, father, and mother; Diabetes in her brother, father, and mother; Hypertension in her brother, father, and mother; Kidney cancer in her mother; Kidney failure in her father; Post-traumatic stress disorder in her father; Sleep apnea in her brother, father, and mother.  Past Surgical History:  Procedure Laterality Date  . CARPAL TUNNEL RELEASE Bilateral   . CHOLECYSTECTOMY    . DIAGNOSTIC LAPAROSCOPY    . JOINT REPLACEMENT Left hip  . LAPAROSCOPIC PARTIAL GASTRECTOMY    . SHOULDER ARTHROSCOPY Right   . THYROIDECTOMY N/A 11/12/2015   Procedure: THYROIDECTOMY;  Surgeon: Clyde Canterbury, MD;  Location: [facility] ORS;  Service: ENT;  Laterality: N/A;  . TRIGGER FINGER RELEASE Right    Constitutional Exam  General appearance: Well nourished, well developed, and well hydrated. In no apparent acute distress Vitals:   12/02/16 0952 12/02/16 0955  BP:  (!) 154/78  Pulse:  (!) 103  Resp: (!) 22 (!) 22  Temp:  98.7 F (37.1 C)  SpO2:  99%  Weight:  (!) 430 lb (195 kg)  Height:  '5\' 4"'  (1.626 m)   BMI Assessment: Estimated body mass index is 73.81 kg/m as calculated from the following:   Height as of this encounter: '5\' 4"'  (1.626 m).   Weight as of this encounter: 430 lb (195 kg).  BMI interpretation table: BMI level Category Range association with higher incidence of chronic pain  <18 kg/m2 Underweight   18.5-24.9 kg/m2 Ideal body weight   25-29.9 kg/m2 Overweight Increased incidence by 20%  30-34.9 kg/m2 Obese (Class I) Increased incidence by 68%  35-39.9 kg/m2 Severe obesity (Class II) Increased incidence by 136%  >40 kg/m2 Extreme obesity (Class III) Increased incidence by 254%   BMI Readings from Last 4 Encounters:  12/02/16 73.81 kg/m  08/23/16 71.92 kg/m  08/09/16 71.92 kg/m  07/06/16 72.78 kg/m   Wt Readings from Last 4 Encounters:  12/02/16 (!) 430 lb  (195 kg)  08/23/16 (!) 419 lb (190.1 kg)  08/09/16 (!) 419 lb (190.1 kg)  07/06/16 (!) 424 lb (192.3 kg)  Psych/Mental status: Alert, oriented x 3 (person, place, & time)       Eyes: PERLA Respiratory: No evidence of acute respiratory distress  Cervical Spine Exam  Inspection: No masses, redness, or  swelling Alignment: Symmetrical Functional ROM: Unrestricted ROM Stability: No instability detected Muscle strength & Tone: Functionally intact Sensory: Unimpaired Palpation: No palpable anomalies  Upper Extremity (UE) Exam    Side: Right upper extremity  Side: Left upper extremity  Inspection: No masses, redness, swelling, or asymmetry. No contractures  Inspection: No masses, redness, swelling, or asymmetry. No contractures  Functional ROM: Unrestricted ROM          Functional ROM: Unrestricted ROM          Muscle strength & Tone: Functionally intact  Muscle strength & Tone: Functionally intact  Sensory: Unimpaired  Sensory: Unimpaired  Palpation: No palpable anomalies  Palpation: No palpable anomalies  Specialized Test(s): Deferred         Specialized Test(s): Deferred          Thoracic Spine Exam  Inspection: No masses, redness, or swelling Alignment: Symmetrical Functional ROM: Unrestricted ROM Stability: No instability detected Sensory: Unimpaired Muscle strength & Tone: No palpable anomalies  Lumbar Spine Exam  Inspection: No masses, redness, or swelling Alignment: Symmetrical Functional ROM: Unrestricted ROM Stability: No instability detected Muscle strength & Tone: Functionally intact Sensory: Unimpaired Palpation: No palpable anomalies Provocative Tests: Lumbar Hyperextension and rotation test: evaluation deferred today       Patrick's Maneuver: evaluation deferred today              Gait & Posture Assessment  Ambulation: Patient ambulates using a cane Gait: Modified gait pattern (slower gait speed, wider stride width, and longer stance duration) associated with  morbid obesity Posture: WNL   Lower Extremity Exam    Side: Right lower extremity  Side: Left lower extremity  Inspection: No masses, redness, swelling, or asymmetry. No contractures  Inspection: No masses, redness, swelling, or asymmetry. No contractures  Functional ROM: Unrestricted ROM          Functional ROM: Unrestricted ROM          Muscle strength & Tone: Functionally intact  Muscle strength & Tone: Functionally intact  Sensory: Unimpaired  Sensory: Unimpaired  Palpation: No palpable anomalies  Palpation: No palpable anomalies   Assessment  Primary Diagnosis & Pertinent Problem List: The primary encounter diagnosis was Chronic pain syndrome. Diagnoses of Chronic knee pain (Location of Primary Source of Pain) (Bilateral) (R>L), Chronic hip pain (Location of Secondary source of pain) (Right), Chronic low back pain (Location of Tertiary source of pain) (Bilateral) (R>L), Fibromyalgia, Lumbar facet syndrome (Bilateral) (R>L), Lumbar spondylosis, Osteoarthritis of knee (Bilateral) (R>L), Long term prescription opiate use, Opiate use, and Neurogenic pain were also pertinent to this visit.  Status Diagnosis  Controlled Controlled Controlled 1. Chronic pain syndrome   2. Chronic knee pain (Location of Primary Source of Pain) (Bilateral) (R>L)   3. Chronic hip pain (Location of Secondary source of pain) (Right)   4. Chronic low back pain (Location of Tertiary source of pain) (Bilateral) (R>L)   5. Fibromyalgia   6. Lumbar facet syndrome (Bilateral) (R>L)   7. Lumbar spondylosis   8. Osteoarthritis of knee (Bilateral) (R>L)   9. Long term prescription opiate use   10. Opiate use   11. Neurogenic pain      Plan of Care  Pharmacotherapy (Medications Ordered): Meds ordered this encounter  Medications  . oxyCODONE (OXY IR/ROXICODONE) 5 MG immediate release tablet    Sig: Take 1 tablet (5 mg total) by mouth 5 (five) times daily as needed for severe pain.    Dispense:  150 tablet     Refill:  0    Fill one day early if pharmacy is closed on scheduled refill date. Do not fill until: 12/04/16 To last until: 01/03/17  . oxyCODONE (OXY IR/ROXICODONE) 5 MG immediate release tablet    Sig: Take 1 tablet (5 mg total) by mouth 5 (five) times daily as needed for severe pain.    Dispense:  150 tablet    Refill:  0    Fill one day early if pharmacy is closed on scheduled refill date. Do not fill until: 01/03/17 To last until: 02/02/17  . oxyCODONE (OXY IR/ROXICODONE) 5 MG immediate release tablet    Sig: Take 1 tablet (5 mg total) by mouth 5 (five) times daily as needed for severe pain.    Dispense:  150 tablet    Refill:  0    Fill one day early if pharmacy is closed on scheduled refill date. Do not fill until: 02/02/17 To last until: 03/04/17  . gabapentin (NEURONTIN) 800 MG tablet    Sig: Take 1 tablet (800 mg total) by mouth every 6 (six) hours.    Dispense:  360 tablet    Refill:  0    Do not place this medication, or any other prescription from our practice, on "Automatic Refill". Patient may have prescription filled one day early if pharmacy is closed on scheduled refill date.   New Prescriptions   GABAPENTIN (NEURONTIN) 800 MG TABLET    Take 1 tablet (800 mg total) by mouth every 6 (six) hours.   OXYCODONE (OXY IR/ROXICODONE) 5 MG IMMEDIATE RELEASE TABLET    Take 1 tablet (5 mg total) by mouth 5 (five) times daily as needed for severe pain.   OXYCODONE (OXY IR/ROXICODONE) 5 MG IMMEDIATE RELEASE TABLET    Take 1 tablet (5 mg total) by mouth 5 (five) times daily as needed for severe pain.   OXYCODONE (OXY IR/ROXICODONE) 5 MG IMMEDIATE RELEASE TABLET    Take 1 tablet (5 mg total) by mouth 5 (five) times daily as needed for severe pain.   Medications administered today: Karen Lewis had no medications administered during this visit. Lab-work, procedure(s), and/or referral(s): No orders of the defined types were placed in this encounter.  Imaging and/or  referral(s): None  Interventional therapies: Planned, scheduled, and/or pending:   Not at this time.   Considering:   Series of 5 Hyalgan knee injections    Palliative PRN treatment(s):   Intra-articular Hyalgan knee injections.  Diagnostic bilateral genicular nerve block  Palliative bilateral lumbar facet block    Provider-requested follow-up: Return in 3 months (on 02/17/2017) for (Nurse Practitioner) Med-Mgmt, in addition, (PRN) procedure.  Future Appointments Date Time Provider [place]  02/17/2017 11:00 AM Jackson, NP [facility] None   Primary Care Physician: Idelle Crouch, MD Location: [facility] Note by: Kathlen Brunswick. [name], M.D, DABA, DABAPM, DABPM, DABIPP, FIPP Date: 12/02/2016; Time: 2:28 PM  Pain Score Disclaimer: We use the NRS-11 scale. This is a self-reported, subjective measurement of pain severity with only modest accuracy. It is used primarily to identify changes within a particular patient. It must be understood that outpatient pain scales are significantly less accurate that those used for research, where they can be applied under ideal controlled circumstances with minimal exposure to variables. In reality, the score is likely to be a combination of pain intensity and pain affect, where pain affect describes the degree of emotional arousal or changes in action readiness caused by the sensory experience of pain. Factors  such as social and work situation, setting, emotional state, anxiety levels, expectation, and prior pain experience may influence pain perception and show large inter-individual differences that may also be affected by time variables.  Patient instructions provided during this appointment: Patient Instructions   Pain Score  Introduction: The pain score used by this practice is the Verbal Numerical Rating Scale (VNRS-11). This is an 11-point scale. It is for adults and children 10 years or older. There are  significant differences in how the pain score is reported, used, and applied. Forget everything you learned in the past and learn this scoring system.  General Information: The scale should reflect your current level of pain. Unless you are specifically asked for the level of your worst pain, or your average pain. If you are asked for one of these two, then it should be understood that it is over the past 24 hours.  Basic Activities of Daily Living (ADL): Personal hygiene, dressing, eating, transferring, and using restroom.  Instructions: Most patients tend to report their level of pain as a combination of two factors, their physical pain and their psychosocial pain. This last one is also known as "suffering" and it is reflection of how physical pain affects you socially and psychologically. From now on, report them separately. From this point on, when asked to report your pain level, report only your physical pain. Use the following table for reference.  Pain Clinic Pain Levels (0-5/10)  Pain Level Score Description  No Pain 0   Mild pain 1 Nagging, annoying, but does not interfere with basic activities of daily living (ADL). Patients are able to eat, bathe, get dressed, toileting (being able to get on and off the toilet and perform personal hygiene functions), transfer (move in and out of bed or a chair without assistance), and maintain continence (able to control bladder and bowel functions). Blood pressure and heart rate are unaffected. A normal heart rate for a healthy adult ranges from 60 to 100 bpm (beats per minute).   Mild to moderate pain 2 Noticeable and distracting. Impossible to hide from other people. More frequent flare-ups. Still possible to adapt and function close to normal. It can be very annoying and may have occasional stronger flare-ups. With discipline, patients may get used to it and adapt.   Moderate pain 3 Interferes significantly with activities of daily living (ADL). It  becomes difficult to feed, bathe, get dressed, get on and off the toilet or to perform personal hygiene functions. Difficult to get in and out of bed or a chair without assistance. Very distracting. With effort, it can be ignored when deeply involved in activities.   Moderately severe pain 4 Impossible to ignore for more than a few minutes. With effort, patients may still be able to manage work or participate in some social activities. Very difficult to concentrate. Signs of autonomic nervous system discharge are evident: dilated pupils (mydriasis); mild sweating (diaphoresis); sleep interference. Heart rate becomes elevated (>115 bpm). Diastolic blood pressure (lower number) rises above 100 mmHg. Patients find relief in laying down and not moving.   Severe pain 5 Intense and extremely unpleasant. Associated with frowning face and frequent crying. Pain overwhelms the senses.  Ability to do any activity or maintain social relationships becomes significantly limited. Conversation becomes difficult. Pacing back and forth is common, as getting into a comfortable position is nearly impossible. Pain wakes you up from deep sleep. Physical signs will be obvious: pupillary dilation; increased sweating; goosebumps; brisk reflexes; cold, clammy  hands and feet; nausea, vomiting or dry heaves; loss of appetite; significant sleep disturbance with inability to fall asleep or to remain asleep. When persistent, significant weight loss is observed due to the complete loss of appetite and sleep deprivation.  Blood pressure and heart rate becomes significantly elevated. Caution: If elevated blood pressure triggers a pounding headache associated with blurred vision, then the patient should immediately seek attention at an urgent or emergency care unit, as these may be signs of an impending stroke.    Emergency Department Pain Levels (6-10/10)  Emergency Room Pain 6 Severely limiting. Requires emergency care and should not be  seen or managed at an outpatient pain management facility. Communication becomes difficult and requires great effort. Assistance to reach the emergency department may be required. Facial flushing and profuse sweating along with potentially dangerous increases in heart rate and blood pressure will be evident.   Distressing pain 7 Self-care is very difficult. Assistance is required to transport, or use restroom. Assistance to reach the emergency department will be required. Tasks requiring coordination, such as bathing and getting dressed become very difficult.   Disabling pain 8 Self-care is no longer possible. At this level, pain is disabling. The individual is unable to do even the most "basic" activities such as walking, eating, bathing, dressing, transferring to a bed, or toileting. Fine motor skills are lost. It is difficult to think clearly.   Incapacitating pain 9 Pain becomes incapacitating. Thought processing is no longer possible. Difficult to remember your own name. Control of movement and coordination are lost.   The worst pain imaginable 10 At this level, most patients pass out from pain. When this level is reached, collapse of the autonomic nervous system occurs, leading to a sudden drop in blood pressure and heart rate. This in turn results in a temporary and dramatic drop in blood flow to the brain, leading to a loss of consciousness. Fainting is one of the body's self defense mechanisms. Passing out puts the brain in a calmed state and causes it to shut down for a while, in order to begin the healing process.    Summary: 1. Refer to this scale when providing [place] with your pain level. 2. Be accurate and careful when reporting your pain level. This will help with your care. 3. Over-reporting your pain level will lead to loss of credibility. 4. Even a level of 1/10 means that there is pain and will be treated at our facility. 5. High, inaccurate reporting will be documented as "Symptom  Exaggeration", leading to loss of credibility and suspicions of possible secondary gains such as obtaining more narcotics, or wanting to appear disabled, for fraudulent reasons. 6. Only pain levels of 5 or below will be seen at our facility. 7. Pain levels of 6 and above will be sent to the Emergency Department and the appointment cancelled.  You were given 3 prescriptions for Oxycodone today. A prescriptions for Gabapentin was sent to your pharmacy. _____________________________________________________________________________________________

## 2016-12-07 DIAGNOSIS — N390 Urinary tract infection, site not specified: Secondary | ICD-10-CM | POA: Diagnosis not present

## 2016-12-20 ENCOUNTER — Telehealth: Payer: Self-pay | Admitting: Pain Medicine

## 2016-12-20 NOTE — Telephone Encounter (Signed)
Patient lvmail on Friday at 10:39  Having increased knee pain, want to know how soon can she have another injection into knees ? Patient has [employer], procedure may need prior auth.

## 2016-12-20 NOTE — Telephone Encounter (Signed)
Called pt- was unable to talk at the time. She was making a doctors appt and wants [place] to call her back

## 2016-12-28 ENCOUNTER — Telehealth: Payer: Self-pay

## 2016-12-28 DIAGNOSIS — J019 Acute sinusitis, unspecified: Secondary | ICD-10-CM | POA: Diagnosis not present

## 2016-12-28 DIAGNOSIS — Z1231 Encounter for screening mammogram for malignant neoplasm of breast: Secondary | ICD-10-CM | POA: Diagnosis not present

## 2016-12-28 DIAGNOSIS — J449 Chronic obstructive pulmonary disease, unspecified: Secondary | ICD-10-CM | POA: Diagnosis not present

## 2016-12-28 DIAGNOSIS — N390 Urinary tract infection, site not specified: Secondary | ICD-10-CM | POA: Diagnosis not present

## 2016-12-28 DIAGNOSIS — B373 Candidiasis of vulva and vagina: Secondary | ICD-10-CM | POA: Diagnosis not present

## 2016-12-28 NOTE — Telephone Encounter (Signed)
Patient has called requesting a knee injection.  She has a prn order in for knee injection.  Sending to [place] for pre authorization.

## 2016-12-28 NOTE — Telephone Encounter (Signed)
Please call patient about getting knee injections and how soon can she get them.

## 2017-01-03 ENCOUNTER — Ambulatory Visit: Payer: Medicare HMO | Admitting: Pain Medicine

## 2017-01-12 ENCOUNTER — Ambulatory Visit
Admission: RE | Admit: 2017-01-12 | Discharge: 2017-01-12 | Disposition: A | Discharge status: Home / Self Care | Payer: Medicare HMO | Source: Ambulatory Visit | Attending: Pain Medicine | Admitting: Pain Medicine

## 2017-01-12 ENCOUNTER — Ambulatory Visit (HOSPITAL_BASED_OUTPATIENT_CLINIC_OR_DEPARTMENT_OTHER): Payer: Medicare HMO | Admitting: Pain Medicine

## 2017-01-12 ENCOUNTER — Encounter: Payer: Self-pay | Admitting: Pain Medicine

## 2017-01-12 VITALS — BP 103/93 | HR 88 | Temp 98.9°F | Resp 23 | Ht 64.0 in | Wt >= 6400 oz

## 2017-01-12 DIAGNOSIS — M170 Bilateral primary osteoarthritis of knee: Secondary | ICD-10-CM

## 2017-01-12 DIAGNOSIS — E559 Vitamin D deficiency, unspecified: Secondary | ICD-10-CM | POA: Diagnosis not present

## 2017-01-12 DIAGNOSIS — M25561 Pain in right knee: Secondary | ICD-10-CM | POA: Insufficient documentation

## 2017-01-12 DIAGNOSIS — E118 Type 2 diabetes mellitus with unspecified complications: Secondary | ICD-10-CM | POA: Diagnosis not present

## 2017-01-12 DIAGNOSIS — M159 Polyosteoarthritis, unspecified: Secondary | ICD-10-CM

## 2017-01-12 DIAGNOSIS — G8929 Other chronic pain: Secondary | ICD-10-CM | POA: Diagnosis not present

## 2017-01-12 DIAGNOSIS — G894 Chronic pain syndrome: Secondary | ICD-10-CM | POA: Diagnosis not present

## 2017-01-12 DIAGNOSIS — M25562 Pain in left knee: Secondary | ICD-10-CM

## 2017-01-12 DIAGNOSIS — E782 Mixed hyperlipidemia: Secondary | ICD-10-CM | POA: Diagnosis not present

## 2017-01-12 DIAGNOSIS — E079 Disorder of thyroid, unspecified: Secondary | ICD-10-CM | POA: Diagnosis not present

## 2017-01-12 DIAGNOSIS — E611 Iron deficiency: Secondary | ICD-10-CM | POA: Diagnosis not present

## 2017-01-12 DIAGNOSIS — E890 Postprocedural hypothyroidism: Secondary | ICD-10-CM | POA: Diagnosis not present

## 2017-01-12 DIAGNOSIS — N390 Urinary tract infection, site not specified: Secondary | ICD-10-CM | POA: Diagnosis not present

## 2017-01-12 DIAGNOSIS — E538 Deficiency of other specified B group vitamins: Secondary | ICD-10-CM | POA: Diagnosis not present

## 2017-01-12 DIAGNOSIS — Z9884 Bariatric surgery status: Secondary | ICD-10-CM | POA: Diagnosis not present

## 2017-01-12 DIAGNOSIS — M150 Primary generalized (osteo)arthritis: Secondary | ICD-10-CM

## 2017-01-12 MED ORDER — ROPIVACAINE HCL 2 MG/ML IJ SOLN
INTRAMUSCULAR | Status: AC
Start: 2017-01-12 — End: 2017-01-12
  Filled 2017-01-12: qty 10

## 2017-01-12 MED ORDER — ROPIVACAINE HCL 2 MG/ML IJ SOLN
5.0000 mL | Freq: Once | INTRAMUSCULAR | Status: AC
  Administered 2017-01-12: 10 mL via INTRA_ARTICULAR
  Filled 2017-01-12: qty 10

## 2017-01-12 MED ORDER — SODIUM HYALURONATE (VISCOSUP) 20 MG/2ML IX SOSY
2.0000 mL | PREFILLED_SYRINGE | Freq: Once | INTRA_ARTICULAR | Status: DC
  Filled 2017-01-12: qty 2

## 2017-01-12 MED ORDER — LIDOCAINE HCL (PF) 1 % IJ SOLN
5.0000 mL | Freq: Once | INTRAMUSCULAR | Status: AC
  Administered 2017-01-12: 5 mL
  Filled 2017-01-12: qty 5

## 2017-01-12 NOTE — Progress Notes (Signed)
Safety precautions to be maintained throughout the outpatient stay will include: orient to surroundings, keep bed in low position, maintain call bell within reach at all times, provide assistance with transfer out of bed and ambulation.  

## 2017-01-12 NOTE — Progress Notes (Addendum)
Patient's Name: Karen Lewis  MRN: [id number]  Referring Provider: Idelle Crouch, MD  DOB: 10/29/1970  PCP: Idelle Crouch, MD  DOS: 01/12/2017  Note by: Kathlen Brunswick. [name], MD  Service setting: Ambulatory outpatient  Location: [facility] (AMB) Pain Management Facility  Visit type: Procedure  Specialty: Interventional Pain Management  Patient type: Established   Primary Reason for Visit: Interventional Pain Management Treatment. CC: Knee Pain (bilateral); Hip Pain (right); and Back Pain (mid to low)  Procedure:  Anesthesia, Analgesia, Anxiolysis:  Type: Diagnostic Intra-Articular Hyalgan Knee Injection #4 Region: Lateral infrapatellar Knee Region Level: Knee Joint Laterality: Bilateral  Type: Local Anesthesia Local Anesthetic: Lidocaine 1% Route: Infiltration (Cement/IM) IV Access: Declined Sedation: Declined  Indication(s): Analgesia          Indications: 1. Chronic knee pain (Location of Primary Source of Pain) (Bilateral) (R>L)   2. Osteoarthritis of knee (Bilateral) (R>L)   3. Osteoarthritis   4. Chronic pain syndrome    Pain Score: Pre-procedure: 3 /10 Post-procedure: 0-No pain/10  Pre-op Assessment:  Previous date of service: 12/02/16 Service provided: Med Refill, Evaluation Karen Lewis is a 46 y.o. (year old), female patient, seen today for interventional treatment. She  has a past surgical history that includes Laparoscopic partial gastrectomy; Shoulder arthroscopy (Right); Joint replacement (Left, hip); Carpal tunnel release (Bilateral); Diagnostic laparoscopy; Cholecystectomy; Trigger finger release (Right); and Thyroidectomy (N/A, 11/12/2015). Her primarily concern today is the Knee Pain (bilateral); Hip Pain (right); and Back Pain (mid to low)  Initial Vital Signs: There were no vitals taken for this visit. BMI: 72.44 kg/m  Risk Assessment: Allergies: Reviewed. She is allergic to bactrim [sulfamethoxazole-trimethoprim]; xolair [omalizumab]; ciprofloxacin; sulfa antibiotics;  aspirin; clindamycin; motrin [ibuprofen]; nsaids; and other.  Allergy Precautions: None required Coagulopathies: "Reviewed. None identified.  Blood-thinner therapy: None at this time Active Infection(s): Reviewed. None identified. Karen Lewis is afebrile  Site Confirmation: Karen Lewis was asked to confirm the procedure and laterality before marking the site Procedure checklist: Completed Consent: Before the procedure and under the influence of no sedative(s), amnesic(s), or anxiolytics, the patient was informed of the treatment options, risks and possible complications. To fulfill our ethical and legal obligations, as recommended by the American Medical Association's Code of Ethics, I have informed the patient of my clinical impression; the nature and purpose of the treatment or procedure; the risks, benefits, and possible complications of the intervention; the alternatives, including doing nothing; the risk(s) and benefit(s) of the alternative treatment(s) or procedure(s); and the risk(s) and benefit(s) of doing nothing. The patient was provided information about the general risks and possible complications associated with the procedure. These may include, but are not limited to: failure to achieve desired goals, infection, bleeding, organ or nerve damage, allergic reactions, paralysis, and death. In addition, the patient was informed of those risks and complications associated to the procedure, such as failure to decrease pain; infection; bleeding; organ or nerve damage with subsequent damage to sensory, motor, and/or autonomic systems, resulting in permanent pain, numbness, and/or weakness of one or several areas of the body; allergic reactions; (i.e.: anaphylactic reaction); and/or death. Furthermore, the patient was informed of those risks and complications associated with the medications. These include, but are not limited to: allergic reactions (i.e.: anaphylactic or anaphylactoid reaction(s)); adrenal  axis suppression; blood sugar elevation that in diabetics may result in ketoacidosis or comma; water retention that in patients with history of congestive heart failure may result in shortness of breath, pulmonary edema, and decompensation with resultant heart  failure; weight gain; swelling or edema; medication-induced neural toxicity; particulate matter embolism and blood vessel occlusion with resultant organ, and/or nervous system infarction; and/or aseptic necrosis of one or more joints. Finally, the patient was informed that Medicine is not an exact science; therefore, there is also the possibility of unforeseen or unpredictable risks and/or possible complications that may result in a catastrophic outcome. The patient indicated having understood very clearly. We have given the patient no guarantees and we have made no promises. Enough time was given to the patient to ask questions, all of which were answered to the patient's satisfaction. Karen Lewis has indicated that she wanted to continue with the procedure. Attestation: I, the ordering provider, attest that I have discussed with the patient the benefits, risks, side-effects, alternatives, likelihood of achieving goals, and potential problems during recovery for the procedure that I have provided informed consent. Date: 01/12/2017; Time: 7:53 AM  Pre-Procedure Preparation:  Monitoring: As per clinic protocol. Respiration, ETCO2, SpO2, BP, heart rate and rhythm monitor placed and checked for adequate function Safety Precautions: Patient was assessed for positional comfort and pressure points before starting the procedure. Time-out: I initiated and conducted the "Time-out" before starting the procedure, as per protocol. The patient was asked to participate by confirming the accuracy of the "Time Out" information. Verification of the correct person, site, and procedure were performed and confirmed by me, the nursing staff, and the patient. "Time-out"  conducted as per Joint Commission's Universal Protocol (UP.01.01.01). "Time-out" Date & Time: 01/12/2017; 0956 hrs.  Description of Procedure Process:   Position: Supine Target Area: Knee Joint Approach: Just above the Lateral tibial plateau, lateral to the infrapatellar tendon. Area Prepped: Entire knee area, from the mid-thigh to the mid-shin. Prepping solution: ChloraPrep (2% chlorhexidine gluconate and 70% isopropyl alcohol) Safety Precautions: Aspiration looking for blood return was conducted prior to all injections. At no point did we inject any substances, as a needle was being advanced. No attempts were made at seeking any paresthesias. Safe injection practices and needle disposal techniques used. Medications properly checked for expiration dates. SDV (single dose vial) medications used. Description of the Procedure: Protocol guidelines were followed. The patient was placed in position over the fluoroscopy table. The target area was identified and the area prepped in the usual manner. Skin desensitized using vapocoolant spray. Skin & deeper tissues infiltrated with local anesthetic. Appropriate amount of time allowed to pass for local anesthetics to take effect. The procedure needles were then advanced to the target area. Proper needle placement secured. Negative aspiration confirmed. Solution injected in intermittent fashion, asking for systemic symptoms every 0.5cc of injectate. The needles were then removed and the area cleansed, making sure to leave some of the prepping solution back to take advantage of its long term bactericidal properties. Vitals:   01/12/17 1005 01/12/17 1010 01/12/17 1015 01/12/17 1020  BP: 131/83 140/85 114/74 (!) 103/93  Pulse: 92 87 85 88  Resp: 19 (!) 22 18 (!) 23  Temp:      SpO2: 99% 100% 97% 95%  Weight:      Height:        Start Time: 0956 hrs. End Time: 1011 hrs. Materials:  Needle(s) Type: Regular needle Gauge: 22G Length: 3.5-in Medication(s): We  administered lidocaine (PF) and ropivacaine (PF) 2 mg/mL (0.2%). Please see chart orders for dosing details.  Imaging Guidance:  Type of Imaging Technique: Fluoroscopy Guidance (Non-spinal) Indication(s): Assistance in needle guidance and placement for procedures requiring needle placement in or near specific  anatomical locations not easily accessible without such assistance. Exposure Time: Please see nurses notes. Contrast: None used. Fluoroscopic Guidance: I was personally present during the use of fluoroscopy. "Tunnel Vision Technique" used to obtain the best possible view of the target area. Parallax error corrected before commencing the procedure. "Direction-depth-direction" technique used to introduce the needle under continuous pulsed fluoroscopy. Once target was reached, antero-posterior, oblique, and lateral fluoroscopic projection used confirm needle placement in all planes. Images permanently stored in EMR. Ultrasound Guidance: N/A Interpretation: No contrast injected. I personally interpreted the imaging intraoperatively. Adequate needle placement confirmed in multiple planes. Permanent images saved into the patient's record.  Antibiotic Prophylaxis:  Indication(s): None identified Antibiotic given: None  Post-operative Assessment:  EBL: None Complications: No immediate post-treatment complications observed by team, or reported by patient. Note: The patient tolerated the entire procedure well. A repeat set of vitals were taken after the procedure and the patient was kept under observation following institutional policy, for this type of procedure. Post-procedural neurological assessment was performed, showing return to baseline, prior to discharge. The patient was provided with post-procedure discharge instructions, including a section on how to identify potential problems. Should any problems arise concerning this procedure, the patient was given instructions to immediately contact us,  at any time, without hesitation. In any case, we plan to contact the patient by telephone for a follow-up status report regarding this interventional procedure. Comments:  No additional relevant information.  Plan of Care  Disposition: Discharge home  Discharge Date & Time: 01/12/2017; 1019 hrs.  Physician-requested Follow-up:  Return in about 2 weeks (around 01/26/2017) for NS procedure, (2 wks), by MD.  Future Appointments Date Time Provider Pierce  01/18/2017 3:15 PM Nori Riis, PA-C [facility] None  01/26/2017 9:30 AM Milinda Pointer, MD [facility] None  02/17/2017 11:00 AM Crystal Dorrene German, NP [facility] None   Medications ordered for procedure: Meds ordered this encounter  Medications  . Sodium Hyaluronate SOSY 2 mL  . Sodium Hyaluronate SOSY 2 mL  . lidocaine (PF) (XYLOCAINE) 1 % injection 5 mL  . ropivacaine (PF) 2 mg/mL (0.2%) (NAROPIN) injection 5 mL   Medications administered: We administered lidocaine (PF) and ropivacaine (PF) 2 mg/mL (0.2%).  See the medical record for exact dosing, route, and time of administration.  Lab-work, Procedure(s), & Referral(s) Ordered: Orders Placed This Encounter  Procedures  . KNEE INJECTION  . KNEE INJECTION  . DG C-Arm 1-60 Min-No Report  . Informed Consent Details: Transcribe to consent form and obtain patient signature  . Provider attestation of informed consent for procedure/surgical case  . Verify informed consent  . Discharge instructions  . Follow-up   Imaging Ordered: Results for orders placed in visit on 08/23/16  DG C-Arm 1-60 Min-No Report   Narrative There is no Radiologist interpretation  for this exam.   New Prescriptions   No medications on file   Primary Care Physician: Idelle Crouch, MD Location: [facility] Note by: Kathlen Brunswick. [name], M.D, DABA, DABAPM, DABPM, DABIPP, FIPP Date: 01/12/2017; Time: 10:28 AM  Disclaimer:  Medicine is not an exact science. The only  guarantee in medicine is that nothing is guaranteed. It is important to note that the decision to proceed with this intervention was based on the information collected from the patient. The Data and conclusions were drawn from the patient's questionnaire, the interview, and the physical examination. Because the information was provided in large part by the patient, it cannot be guaranteed that it  has not been purposely or unconsciously manipulated. Every effort has been made to obtain as much relevant data as possible for this evaluation. It is important to note that the conclusions that lead to this procedure are derived in large part from the available data. Always take into account that the treatment will also be dependent on availability of resources and existing treatment guidelines, considered by other Pain Management Practitioners as being common knowledge and practice, at the time of the intervention. For Medico-Legal purposes, it is also important to point out that variation in procedural techniques and pharmacological choices are the acceptable norm. The indications, contraindications, technique, and results of the above procedure should only be interpreted and judged by a Board-Certified Interventional Pain Specialist with extensive familiarity and expertise in the same exact procedure and technique.  Instructions provided at this appointment: Patient Instructions   Pain Management Discharge Instructions  General Discharge Instructions :  If you need to reach your doctor call: Monday-Friday 8:00 am - 4:00 pm at [phone] or toll free [phone].  After clinic hours [phone] to have operator reach doctor.  Bring all of your medication bottles to all your appointments in the pain clinic.  To cancel or reschedule your appointment with Pain Management please remember to call 24 hours in advance to avoid a fee.  Refer to the educational materials which you have been given on: General  Risks, I had my Procedure. Discharge Instructions, Post Sedation.  Post Procedure Instructions:  The drugs you were given will stay in your system until tomorrow, so for the next 24 hours you should not drive, make any legal decisions or drink any alcoholic beverages.  You may eat anything you prefer, but it is better to start with liquids then soups and crackers, and gradually work up to solid foods.  Please notify your doctor immediately if you have any unusual bleeding, trouble breathing or pain that is not related to your normal pain.  Depending on the type of procedure that was done, some parts of your body may feel week and/or numb.  This usually clears up by tonight or the next day.  Walk with the use of an assistive device or accompanied by an adult for the 24 hours.  You may use ice on the affected area for the first 24 hours.  Put ice in a Ziploc bag and cover with a towel and place against area 15 minutes on 15 minutes off.  You may switch to heat after 24 hours.Knee Injection A knee injection is a procedure to get medicine into your knee joint. Your health care provider puts a needle into the joint and injects medicine with an attached syringe. The injected medicine may relieve the pain, swelling, and stiffness of arthritis. The injected medicine may also help to lubricate and cushion your knee joint. You may need more than one injection. Tell a health care provider about:  Any allergies you have.  All medicines you are taking, including vitamins, herbs, eye drops, creams, and over-the-counter medicines.  Any problems you or family members have had with anesthetic medicines.  Any blood disorders you have.  Any surgeries you have had.  Any medical conditions you have. What are the risks? Generally, this is a safe procedure. However, problems may occur, including:  Infection.  Bleeding.  Worsening symptoms.  Damage to the area around your knee.  Allergic reaction to  any of the medicines.  Skin reactions from repeated injections. What happens before the procedure?  Ask your  health care provider about changing or stopping your regular medicines. This is especially important if you are taking diabetes medicines or blood thinners.  Plan to have someone take you home after the procedure. What happens during the procedure?  You will sit or lie down in a position for your knee to be treated.  The skin over your kneecap will be cleaned with a germ-killing solution (antiseptic).  You will be given a medicine that numbs the area (local anesthetic). You may feel some stinging.  After your knee becomes numb, you will have a second injection. This is the medicine. This needle is carefully placed between your kneecap and your knee. The medicine is injected into the joint space.  At the end of the procedure, the needle will be removed.  A bandage (dressing) may be placed over the injection site. The procedure may vary among health care providers and hospitals. What happens after the procedure?  You may have to move your knee through its full range of motion. This helps to get all of the medicine into your joint space.  Your blood pressure, heart rate, breathing rate, and blood oxygen level will be monitored often until the medicines you were given have worn off.  You will be watched to make sure that you do not have a reaction to the injected medicine. This information is not intended to replace advice given to you by your health care provider. Make sure you discuss any questions you have with your health care provider. Document Released: 11/21/2006 Document Revised: 01/30/2016 Document Reviewed: 07/10/2014 Elsevier Interactive Patient Education  2017 [place]  What are the risk, side effects and possible complications? Generally speaking, most procedures are safe.  However, with any procedure there are risks, side  effects, and the possibility of complications.  The risks and complications are dependent upon the sites that are lesioned, or the type of nerve block to be performed.  The closer the procedure is to the spine, the more serious the risks are.  Great care is taken when placing the radio frequency needles, block needles or lesioning probes, but sometimes complications can occur. 1. Infection: Any time there is an injection through the skin, there is a risk of infection.  This is why sterile conditions are used for these blocks.  There are four possible types of infection. 1. Localized skin infection. 2. Central Nervous System Infection-This can be in the form of Meningitis, which can be deadly. 3. Epidural Infections-This can be in the form of an epidural abscess, which can cause pressure inside of the spine, causing compression of the spinal cord with subsequent paralysis. This would require an emergency surgery to decompress, and there are no guarantees that the patient would recover from the paralysis. 4. Discitis-This is an infection of the intervertebral discs.  It occurs in about 1% of discography procedures.  It is difficult to treat and it may lead to surgery.        2. Pain: the needles have to go through skin and soft tissues, will cause soreness.       3. Damage to internal structures:  The nerves to be lesioned may be near blood vessels or    other nerves which can be potentially damaged.       4. Bleeding: Bleeding is more common if the patient is taking blood thinners such as  aspirin, Coumadin, Ticiid, Plavix, etc., or if he/she have some genetic predisposition  such as hemophilia. Bleeding  into the spinal canal can cause compression of the spinal  cord with subsequent paralysis.  This would require an emergency surgery to  decompress and there are no guarantees that the patient would recover from the  paralysis.       5. Pneumothorax:  Puncturing of a lung is a possibility, every time a  needle is introduced in  the area of the chest or upper back.  Pneumothorax refers to free air around the  collapsed lung(s), inside of the thoracic cavity (chest cavity).  Another two possible  complications related to a similar event would include: Hemothorax and Chylothorax.   These are variations of the Pneumothorax, where instead of air around the collapsed  lung(s), you may have blood or chyle, respectively.       6. Spinal headaches: They may occur with any procedures in the area of the spine.       7. Persistent CSF (Cerebro-Spinal Fluid) leakage: This is a rare problem, but may occur  with prolonged intrathecal or epidural catheters either due to the formation of a fistulous  track or a dural tear.       8. Nerve damage: By working so close to the spinal cord, there is always a possibility of  nerve damage, which could be as serious as a permanent spinal cord injury with  paralysis.       9. Death:  Although rare, severe deadly allergic reactions known as "Anaphylactic  reaction" can occur to any of the medications used.      10. Worsening of the symptoms:  We can always make thing worse.  What are the chances of something like this happening? Chances of any of this occuring are extremely low.  By statistics, you have more of a chance of getting killed in a motor vehicle accident: while driving to the hospital than any of the above occurring .  Nevertheless, you should be aware that they are possibilities.  In general, it is similar to taking a shower.  Everybody knows that you can slip, hit your head and get killed.  Does that mean that you should not shower again?  Nevertheless always keep in mind that statistics do not mean anything if you happen to be on the wrong side of them.  Even if a procedure has a 1 (one) in a 1,000,000 (million) chance of going wrong, it you happen to be that one..Also, keep in mind that by statistics, you have more of a chance of having something go wrong when taking  medications.  Who should not have this procedure? If you are on a blood thinning medication (e.g. Coumadin, Plavix, see list of "Blood Thinners"), or if you have an active infection going on, you should not have the procedure.  If you are taking any blood thinners, please inform your physician.  How should I prepare for this procedure?  Do not eat or drink anything at least six hours prior to the procedure.  Bring a driver with you .  It cannot be a taxi.  Come accompanied by an adult that can drive you back, and that is strong enough to help you if your legs get weak or numb from the local anesthetic.  Take all of your medicines the morning of the procedure with just enough water to swallow them.  If you have diabetes, make sure that you are scheduled to have your procedure done first thing in the morning, whenever possible.  If you have diabetes, take only  half of your insulin dose and notify our nurse that you have done so as soon as you arrive at the clinic.  If you are diabetic, but only take blood sugar pills (oral hypoglycemic), then do not take them on the morning of your procedure.  You may take them after you have had the procedure.  Do not take aspirin or any aspirin-containing medications, at least eleven (11) days prior to the procedure.  They may prolong bleeding.  Wear loose fitting clothing that may be easy to take off and that you would not mind if it got stained with Betadine or blood.  Do not wear any jewelry or perfume  Remove any nail coloring.  It will interfere with some of our monitoring equipment.  NOTE: Remember that this is not meant to be interpreted as a complete list of all possible complications.  Unforeseen problems may occur.  BLOOD THINNERS The following drugs contain aspirin or other products, which can cause increased bleeding during surgery and should not be taken for 2 weeks prior to and 1 week after surgery.  If you should need take something for  relief of minor pain, you may take acetaminophen which is found in Tylenol,m Datril, Anacin-3 and Panadol. It is not blood thinner. The products listed below are.  Do not take any of the products listed below in addition to any listed on your instruction sheet.  A.P.C or A.P.C with Codeine Codeine Phosphate Capsules #3 Ibuprofen Ridaura  ABC compound Congesprin Imuran rimadil  Advil Cope Indocin Robaxisal  Alka-Seltzer Effervescent Pain Reliever and Antacid Coricidin or Coricidin-D  Indomethacin Rufen  Alka-Seltzer plus Cold Medicine Cosprin Ketoprofen S-A-C Tablets  Anacin Analgesic Tablets or Capsules Coumadin Korlgesic Salflex  Anacin Extra Strength Analgesic tablets or capsules CP-2 Tablets Lanoril Salicylate  Anaprox Cuprimine Capsules Levenox Salocol  Anexsia-D Dalteparin Magan Salsalate  Anodynos Darvon compound Magnesium Salicylate Sine-off  Ansaid Dasin Capsules Magsal Sodium Salicylate  Anturane Depen Capsules Marnal Soma  APF Arthritis pain formula Dewitt's Pills Measurin Stanback  Argesic Dia-Gesic Meclofenamic Sulfinpyrazone  Arthritis Bayer Timed Release Aspirin Diclofenac Meclomen Sulindac  Arthritis pain formula Anacin Dicumarol Medipren Supac  Analgesic (Safety coated) Arthralgen Diffunasal Mefanamic Suprofen  Arthritis Strength Bufferin Dihydrocodeine Mepro Compound Suprol  Arthropan liquid Dopirydamole Methcarbomol with Aspirin Synalgos  ASA tablets/Enseals Disalcid Micrainin Tagament  Ascriptin Doan's Midol Talwin  Ascriptin A/D Dolene Mobidin Tanderil  Ascriptin Extra Strength Dolobid Moblgesic Ticlid  Ascriptin with Codeine Doloprin or Doloprin with Codeine Momentum Tolectin  Asperbuf Duoprin Mono-gesic Trendar  Aspergum Duradyne Motrin or Motrin IB Triminicin  Aspirin plain, buffered or enteric coated Durasal Myochrisine Trigesic  Aspirin Suppositories Easprin Nalfon Trillsate  Aspirin with Codeine Ecotrin Regular or Extra Strength Naprosyn Uracel  Atromid-S  Efficin Naproxen Ursinus  Auranofin Capsules Elmiron Neocylate Vanquish  Axotal Emagrin Norgesic Verin  Azathioprine Empirin or Empirin with Codeine Normiflo Vitamin E  Azolid Emprazil Nuprin Voltaren  Bayer Aspirin plain, buffered or children's or timed BC Tablets or powders Encaprin Orgaran Warfarin Sodium  Buff-a-Comp Enoxaparin Orudis Zorpin  Buff-a-Comp with Codeine Equegesic Os-Cal-Gesic   Buffaprin Excedrin plain, buffered or Extra Strength Oxalid   Bufferin Arthritis Strength Feldene Oxphenbutazone   Bufferin plain or Extra Strength Feldene Capsules Oxycodone with Aspirin   Bufferin with Codeine Fenoprofen Fenoprofen Pabalate or Pabalate-SF   Buffets II Flogesic Panagesic   Buffinol plain or Extra Strength Florinal or Florinal with Codeine Panwarfarin   Buf-Tabs Flurbiprofen Penicillamine   Butalbital Compound Four-way cold tablets Penicillin  Butazolidin Fragmin Pepto-Bismol   Carbenicillin Geminisyn Percodan   Carna Arthritis Reliever Geopen Persantine   Carprofen Gold's salt Persistin   Chloramphenicol Goody's Phenylbutazone   Chloromycetin Haltrain Piroxlcam   Clmetidine heparin Plaquenil   Cllnoril Hyco-pap Ponstel   Clofibrate Hydroxy chloroquine Propoxyphen         Before stopping any of these medications, be sure to consult the physician who ordered them.  Some, such as Coumadin (Warfarin) are ordered to prevent or treat serious conditions such as "deep thrombosis", "pumonary embolisms", and other heart problems.  The amount of time that you may need off of the medication may also vary with the medication and the reason for which you were taking it.  If you are taking any of these medications, please make sure you notify your pain physician before you undergo any procedures.

## 2017-01-12 NOTE — Patient Instructions (Signed)
Pain Management Discharge Instructions  General Discharge Instructions :  If you need to reach your doctor call: Monday-Friday 8:00 am - 4:00 pm at [phone] or toll free [phone].  After clinic hours [phone] to have operator reach doctor.  Bring all of your medication bottles to all your appointments in the pain clinic.  To cancel or reschedule your appointment with Pain Management please remember to call 24 hours in advance to avoid a fee.  Refer to the educational materials which you have been given on: General Risks, I had my Procedure. Discharge Instructions, Post Sedation.  Post Procedure Instructions:  The drugs you were given will stay in your system until tomorrow, so for the next 24 hours you should not drive, make any legal decisions or drink any alcoholic beverages.  You may eat anything you prefer, but it is better to start with liquids then soups and crackers, and gradually work up to solid foods.  Please notify your doctor immediately if you have any unusual bleeding, trouble breathing or pain that is not related to your normal pain.  Depending on the type of procedure that was done, some parts of your body may feel week and/or numb.  This usually clears up by tonight or the next day.  Walk with the use of an assistive device or accompanied by an adult for the 24 hours.  You may use ice on the affected area for the first 24 hours.  Put ice in a Ziploc bag and cover with a towel and place against area 15 minutes on 15 minutes off.  You may switch to heat after 24 hours.Knee Injection A knee injection is a procedure to get medicine into your knee joint. Your health care provider puts a needle into the joint and injects medicine with an attached syringe. The injected medicine may relieve the pain, swelling, and stiffness of arthritis. The injected medicine may also help to lubricate and cushion your knee joint. You may need more than one injection. Tell a health  care provider about:  Any allergies you have.  All medicines you are taking, including vitamins, herbs, eye drops, creams, and over-the-counter medicines.  Any problems you or family members have had with anesthetic medicines.  Any blood disorders you have.  Any surgeries you have had.  Any medical conditions you have. What are the risks? Generally, this is a safe procedure. However, problems may occur, including:  Infection.  Bleeding.  Worsening symptoms.  Damage to the area around your knee.  Allergic reaction to any of the medicines.  Skin reactions from repeated injections. What happens before the procedure?  Ask your health care provider about changing or stopping your regular medicines. This is especially important if you are taking diabetes medicines or blood thinners.  Plan to have someone take you home after the procedure. What happens during the procedure?  You will sit or lie down in a position for your knee to be treated.  The skin over your kneecap will be cleaned with a germ-killing solution (antiseptic).  You will be given a medicine that numbs the area (local anesthetic). You may feel some stinging.  After your knee becomes numb, you will have a second injection. This is the medicine. This needle is carefully placed between your kneecap and your knee. The medicine is injected into the joint space.  At the end of the procedure, the needle will be removed.  A bandage (dressing) may be placed over the injection site. The procedure may vary  among health care providers and hospitals. What happens after the procedure?  You may have to move your knee through its full range of motion. This helps to get all of the medicine into your joint space.  Your blood pressure, heart rate, breathing rate, and blood oxygen level will be monitored often until the medicines you were given have worn off.  You will be watched to make sure that you do not have a reaction to  the injected medicine. This information is not intended to replace advice given to you by your health care provider. Make sure you discuss any questions you have with your health care provider. Document Released: 11/21/2006 Document Revised: 01/30/2016 Document Reviewed: 07/10/2014 Elsevier Interactive Patient Education  2017 [employer]  What are the risk, side effects and possible complications? Generally speaking, most procedures are safe.  However, with any procedure there are risks, side effects, and the possibility of complications.  The risks and complications are dependent upon the sites that are lesioned, or the type of nerve block to be performed.  The closer the procedure is to the spine, the more serious the risks are.  Great care is taken when placing the radio frequency needles, block needles or lesioning probes, but sometimes complications can occur. 1. Infection: Any time there is an injection through the skin, there is a risk of infection.  This is why sterile conditions are used for these blocks.  There are four possible types of infection. 1. Localized skin infection. 2. Central Nervous System Infection-This can be in the form of Meningitis, which can be deadly. 3. Epidural Infections-This can be in the form of an epidural abscess, which can cause pressure inside of the spine, causing compression of the spinal cord with subsequent paralysis. This would require an emergency surgery to decompress, and there are no guarantees that the patient would recover from the paralysis. 4. Discitis-This is an infection of the intervertebral discs.  It occurs in about 1% of discography procedures.  It is difficult to treat and it may lead to surgery.        2. Pain: the needles have to go through skin and soft tissues, will cause soreness.       3. Damage to internal structures:  The nerves to be lesioned may be near blood vessels or    other nerves which can be  potentially damaged.       4. Bleeding: Bleeding is more common if the patient is taking blood thinners such as  aspirin, Coumadin, Ticiid, Plavix, etc., or if he/she have some genetic predisposition  such as hemophilia. Bleeding into the spinal canal can cause compression of the spinal  cord with subsequent paralysis.  This would require an emergency surgery to  decompress and there are no guarantees that the patient would recover from the  paralysis.       5. Pneumothorax:  Puncturing of a lung is a possibility, every time a needle is introduced in  the area of the chest or upper back.  Pneumothorax refers to free air around the  collapsed lung(s), inside of the thoracic cavity (chest cavity).  Another two possible  complications related to a similar event would include: Hemothorax and Chylothorax.   These are variations of the Pneumothorax, where instead of air around the collapsed  lung(s), you may have blood or chyle, respectively.       6. Spinal headaches: They may occur with any procedures in the area of  the spine.       7. Persistent CSF (Cerebro-Spinal Fluid) leakage: This is a rare problem, but may occur  with prolonged intrathecal or epidural catheters either due to the formation of a fistulous  track or a dural tear.       8. Nerve damage: By working so close to the spinal cord, there is always a possibility of  nerve damage, which could be as serious as a permanent spinal cord injury with  paralysis.       9. Death:  Although rare, severe deadly allergic reactions known as "Anaphylactic  reaction" can occur to any of the medications used.      10. Worsening of the symptoms:  We can always make thing worse.  What are the chances of something like this happening? Chances of any of this occuring are extremely low.  By statistics, you have more of a chance of getting killed in a motor vehicle accident: while driving to the hospital than any of the above occurring .  Nevertheless, you should be  aware that they are possibilities.  In general, it is similar to taking a shower.  Everybody knows that you can slip, hit your head and get killed.  Does that mean that you should not shower again?  Nevertheless always keep in mind that statistics do not mean anything if you happen to be on the wrong side of them.  Even if a procedure has a 1 (one) in a 1,000,000 (million) chance of going wrong, it you happen to be that one..Also, keep in mind that by statistics, you have more of a chance of having something go wrong when taking medications.  Who should not have this procedure? If you are on a blood thinning medication (e.g. Coumadin, Plavix, see list of "Blood Thinners"), or if you have an active infection going on, you should not have the procedure.  If you are taking any blood thinners, please inform your physician.  How should I prepare for this procedure?  Do not eat or drink anything at least six hours prior to the procedure.  Bring a driver with you .  It cannot be a taxi.  Come accompanied by an adult that can drive you back, and that is strong enough to help you if your legs get weak or numb from the local anesthetic.  Take all of your medicines the morning of the procedure with just enough water to swallow them.  If you have diabetes, make sure that you are scheduled to have your procedure done first thing in the morning, whenever possible.  If you have diabetes, take only half of your insulin dose and notify our nurse that you have done so as soon as you arrive at the clinic.  If you are diabetic, but only take blood sugar pills (oral hypoglycemic), then do not take them on the morning of your procedure.  You may take them after you have had the procedure.  Do not take aspirin or any aspirin-containing medications, at least eleven (11) days prior to the procedure.  They may prolong bleeding.  Wear loose fitting clothing that may be easy to take off and that you would not mind if it  got stained with Betadine or blood.  Do not wear any jewelry or perfume  Remove any nail coloring.  It will interfere with some of our monitoring equipment.  NOTE: Remember that this is not meant to be interpreted as a complete list of all possible complications.  Unforeseen problems may occur.  BLOOD THINNERS The following drugs contain aspirin or other products, which can cause increased bleeding during surgery and should not be taken for 2 weeks prior to and 1 week after surgery.  If you should need take something for relief of minor pain, you may take acetaminophen which is found in Tylenol,m Datril, Anacin-3 and Panadol. It is not blood thinner. The products listed below are.  Do not take any of the products listed below in addition to any listed on your instruction sheet.  A.P.C or A.P.C with Codeine Codeine Phosphate Capsules #3 Ibuprofen Ridaura  ABC compound Congesprin Imuran rimadil  Advil Cope Indocin Robaxisal  Alka-Seltzer Effervescent Pain Reliever and Antacid Coricidin or Coricidin-D  Indomethacin Rufen  Alka-Seltzer plus Cold Medicine Cosprin Ketoprofen S-A-C Tablets  Anacin Analgesic Tablets or Capsules Coumadin Korlgesic Salflex  Anacin Extra Strength Analgesic tablets or capsules CP-2 Tablets Lanoril Salicylate  Anaprox Cuprimine Capsules Levenox Salocol  Anexsia-D Dalteparin Magan Salsalate  Anodynos Darvon compound Magnesium Salicylate Sine-off  Ansaid Dasin Capsules Magsal Sodium Salicylate  Anturane Depen Capsules Marnal Soma  APF Arthritis pain formula Dewitt's Pills Measurin Stanback  Argesic Dia-Gesic Meclofenamic Sulfinpyrazone  Arthritis Bayer Timed Release Aspirin Diclofenac Meclomen Sulindac  Arthritis pain formula Anacin Dicumarol Medipren Supac  Analgesic (Safety coated) Arthralgen Diffunasal Mefanamic Suprofen  Arthritis Strength Bufferin Dihydrocodeine Mepro Compound Suprol  Arthropan liquid Dopirydamole Methcarbomol with Aspirin Synalgos  ASA  tablets/Enseals Disalcid Micrainin Tagament  Ascriptin Doan's Midol Talwin  Ascriptin A/D Dolene Mobidin Tanderil  Ascriptin Extra Strength Dolobid Moblgesic Ticlid  Ascriptin with Codeine Doloprin or Doloprin with Codeine Momentum Tolectin  Asperbuf Duoprin Mono-gesic Trendar  Aspergum Duradyne Motrin or Motrin IB Triminicin  Aspirin plain, buffered or enteric coated Durasal Myochrisine Trigesic  Aspirin Suppositories Easprin Nalfon Trillsate  Aspirin with Codeine Ecotrin Regular or Extra Strength Naprosyn Uracel  Atromid-S Efficin Naproxen Ursinus  Auranofin Capsules Elmiron Neocylate Vanquish  Axotal Emagrin Norgesic Verin  Azathioprine Empirin or Empirin with Codeine Normiflo Vitamin E  Azolid Emprazil Nuprin Voltaren  Bayer Aspirin plain, buffered or children's or timed BC Tablets or powders Encaprin Orgaran Warfarin Sodium  Buff-a-Comp Enoxaparin Orudis Zorpin  Buff-a-Comp with Codeine Equegesic Os-Cal-Gesic   Buffaprin Excedrin plain, buffered or Extra Strength Oxalid   Bufferin Arthritis Strength Feldene Oxphenbutazone   Bufferin plain or Extra Strength Feldene Capsules Oxycodone with Aspirin   Bufferin with Codeine Fenoprofen Fenoprofen Pabalate or Pabalate-SF   Buffets II Flogesic Panagesic   Buffinol plain or Extra Strength Florinal or Florinal with Codeine Panwarfarin   Buf-Tabs Flurbiprofen Penicillamine   Butalbital Compound Four-way cold tablets Penicillin   Butazolidin Fragmin Pepto-Bismol   Carbenicillin Geminisyn Percodan   Carna Arthritis Reliever Geopen Persantine   Carprofen Gold's salt Persistin   Chloramphenicol Goody's Phenylbutazone   Chloromycetin Haltrain Piroxlcam   Clmetidine heparin Plaquenil   Cllnoril Hyco-pap Ponstel   Clofibrate Hydroxy chloroquine Propoxyphen         Before stopping any of these medications, be sure to consult the physician who ordered them.  Some, such as Coumadin (Warfarin) are ordered to prevent or treat serious conditions  such as "deep thrombosis", "pumonary embolisms", and other heart problems.  The amount of time that you may need off of the medication may also vary with the medication and the reason for which you were taking it.  If you are taking any of these medications, please make sure you notify your pain physician before you undergo any procedures.          

## 2017-01-13 ENCOUNTER — Telehealth: Payer: Self-pay

## 2017-01-13 NOTE — Telephone Encounter (Signed)
Pt states she is a little more sore than usual but is OK. "I'm going to take it easy today" informed to call if needed

## 2017-01-17 NOTE — Progress Notes (Signed)
01/18/2017 4:22 PM   Karen Lewis 01-02-1971 [id number]  Referring provider: Idelle Crouch, MD [facility], [place] [address]  Chief Complaint  Patient presents with  . New Patient (Initial Visit)    recurrent uti referred by Dr. Doy Hutching    HPI: Patient is a 46 -year-old Caucasian female who is referred to [place] by, Dr. Doy Hutching, for recurrent urinary tract infections.  Patient states that she has had 7 or more urinary tract infections over the last year.  Reviewing her records,  she has had 6 documented positive urinary tract infections over the last year.  She feels like she has an infection today.    Her symptoms with a urinary tract infection consist of exhaustion, malodorous urine, cloudy urine, back pain, pelvic pain, urgency and dysuria.   She denies gross hematuria, suprapubic pain, back pain, abdominal pain or flank pain.   She has not had any recent fevers, chills, nausea or vomiting.    She does not have a history of nephrolithiasis, GU surgery or GU trauma.   She is sexually active.  She has noted a correlation with her urinary tract infections and sexual intercourse.  She does not engage in anal sex.  She is not voiding before and after sex.   She is not postmenopausal.  She admits to constipation and/or diarrhea.   She does engage in good perineal hygiene. She does not take tub baths.   She does not have incontinence.    She is not having pain with bladder filling.    She has not had any recent imaging studies.    She is drinking 2 bottles of water daily.  Diet sodas and diet teas.  No juices.  Has been on cranberry tablets, probiotics and Vitamin C.  She did not find them helpful.   Her PVR today is 12 mL.    PMH: Past Medical History:  Diagnosis Date  . Anemia   . Anginal pain ([place])   . Anxiety   . Arthralgia of hip 07/29/2015  . Arthritis   . Arthritis, degenerative 07/29/2015  . Asthma   . Cephalalgia 07/25/2014    . Dependence on unknown drug ([place])    multiplt controlled drug dependence  . Depression   . Diabetes mellitus without complication ([place])   . Dysrhythmia   . Eczema   . Fibromyalgia   . Gastritis   . GERD (gastroesophageal reflux disease)   . Gonalgia 07/29/2015   Overview:  Overview:  The patient has had bilateral intra-articular Hyalgan injections done on 07/16/2014 and although she seems to do well with this type of therapy, apparently her insurance company does not want to pay for they Hyalgan. On 11/27/2014 the patient underwent a bilateral genicular nerve block with excellent results. On 01/28/2015 she had a right knee genicular radiofrequency ablatio  . Gout   . H/O cardiovascular disorder 03/10/2015  . H/O surgical procedure 12/05/2012   Overview:  LSG ([facility] - April 2013)    . H/O thyroid disease 03/10/2015  . Headache   . Herpes   . History of artificial joint 07/29/2015  . History of hiatal hernia   . Hypertension   . Hypomagnesemia   . Hypothyroidism   . LBP (low back pain) 07/29/2015  . Neuromuscular disorder ([place])   . Obesity   . PCOS (polycystic ovarian syndrome)   . Primary osteoarthritis of both knees 07/29/2015  . Sleep apnea   . Thyroid nodule  bilateral    Surgical History: Past Surgical History:  Procedure Laterality Date  . CARPAL TUNNEL RELEASE Bilateral   . CHOLECYSTECTOMY    . DIAGNOSTIC LAPAROSCOPY    . JOINT REPLACEMENT Left hip  . LAPAROSCOPIC PARTIAL GASTRECTOMY    . left trigger finger    . SHOULDER ARTHROSCOPY Right   . THYROIDECTOMY N/A 11/12/2015   Procedure: THYROIDECTOMY;  Surgeon: Clyde Canterbury, MD;  Location: [facility] ORS;  Service: ENT;  Laterality: N/A;  . TRIGGER FINGER RELEASE Right     Home Medications:  Allergies as of 01/18/2017      Reactions   Bactrim [sulfamethoxazole-trimethoprim] Hives   Xolair [omalizumab] Itching, Hives   Ciprofloxacin Other (See Comments)   Possible myalgias. myalgia Possible myalgias.   Sulfa Antibiotics     Aspirin Other (See Comments)   GI UPSET   Clindamycin Rash   Motrin [ibuprofen]    Other reaction(s): Other (See Comments) GI UPSET   Nsaids Nausea Only, Other (See Comments)   Stomach upset   Other Hives      Medication List       Accurate as of 01/18/17  4:22 PM. Always use your most recent med list.          ACCU-CHEK AVIVA Soln   Acetaminophen 500 MG coapsule Take 500 mg by mouth every 6 (six) hours as needed.   acyclovir 800 MG tablet Commonly known as:  ZOVIRAX Take 800 mg by mouth 3 times/day as needed-between meals & bedtime. Reported on 11/12/2015   albuterol 108 (90 Base) MCG/ACT inhaler Commonly known as:  PROVENTIL HFA;VENTOLIN HFA Inhale 2 puffs into the lungs every 6 (six) hours as needed for wheezing or shortness of breath.   amitriptyline 100 MG tablet Commonly known as:  ELAVIL Take 100 mg by mouth at bedtime.   amitriptyline 100 MG tablet Commonly known as:  ELAVIL TAKE ONE TABLET BY MOUTH AT BEDTIME   azelastine 0.1 % nasal spray Commonly known as:  ASTELIN Place 1 spray into the nose 2 (two) times daily.   butalbital-acetaminophen-caffeine 50-325-40 MG tablet Commonly known as:  FIORICET, ESGIC 1 tablet every 6 (six) hours as needed.   cephALEXin 250 MG capsule Commonly known as:  KEFLEX Take 1 capsule (250 mg total) by mouth as needed. Take one capsule after intercourse   FIFTY50 GLUCOSE METER 2.0 w/Device Kit Use as directed. E11.0--ACCU CHEK AVIVA   ACCU-CHEK AVIVA PLUS w/Device Kit   FIFTY50 PEN NEEDLES 31G X 8 MM Misc Generic drug:  Insulin Pen Needle Use once daily.   fluconazole 100 MG tablet Commonly known as:  DIFLUCAN Take by mouth.   gabapentin 800 MG tablet Commonly known as:  NEURONTIN Take 1 tablet (800 mg total) by mouth every 6 (six) hours.   glipiZIDE 10 MG tablet Commonly known as:  GLUCOTROL Take 10 mg by mouth daily before breakfast.   guaiFENesin-codeine 100-10 MG/5ML syrup Take by mouth.    ipratropium-albuterol 0.5-2.5 (3) MG/3ML Soln Commonly known as:  DUONEB 3 mLs as needed.   ketoconazole 2 % cream Commonly known as:  NIZORAL APPLY AS DIRECTED TO AFFECTED AREA TWICE DAILY AS NEEDED   levocetirizine 5 MG tablet Commonly known as:  XYZAL Take 5 mg by mouth every evening.   levothyroxine 100 MCG tablet Commonly known as:  SYNTHROID, LEVOTHROID Take on an empty stomach each morning before breakfast. Total dose 225 mcg daily.   levothyroxine 200 MCG tablet Commonly known as:  SYNTHROID, LEVOTHROID Take on an empty  stomach each morning before breakfast. Total dose 250 mcg daily.   levothyroxine 50 MCG tablet Commonly known as:  SYNTHROID, LEVOTHROID Take 50 mcg by mouth daily before breakfast.   LOPRESSOR 50 MG tablet Generic drug:  metoprolol Take 75 mg by mouth 2 (two) times daily.   magnesium oxide 400 MG tablet Commonly known as:  MAG-OX Take 1 tablet (400 mg total) by mouth 2 (two) times daily.   meclizine 25 MG tablet Commonly known as:  ANTIVERT Take 1 tablet (25 mg total) by mouth 3 (three) times daily as needed for dizziness.   medroxyPROGESTERone 150 MG/ML injection Commonly known as:  DEPO-PROVERA Inject 150 mg into the muscle every 3 (three) months.   metFORMIN 1000 MG tablet Commonly known as:  GLUCOPHAGE Take 1,000 mg by mouth 2 (two) times daily with a meal.   multivitamin with minerals Tabs tablet Take 2 tablets by mouth daily. Reported on 11/12/2015   NASACORT AQ 55 MCG/ACT Aero nasal inhaler Generic drug:  triamcinolone 2 sprays by Nasal route 2 (two) times daily.   nitrofurantoin (macrocrystal-monohydrate) 100 MG capsule Commonly known as:  MACROBID Take by mouth.   omeprazole 40 MG capsule Commonly known as:  PRILOSEC Take 40 mg by mouth 2 (two) times daily.   oxyCODONE 5 MG immediate release tablet Commonly known as:  Oxy IR/ROXICODONE Take 1 tablet (5 mg total) by mouth 5 (five) times daily as needed for severe pain.    oxyCODONE 5 MG immediate release tablet Commonly known as:  Oxy IR/ROXICODONE Take 1 tablet (5 mg total) by mouth 5 (five) times daily as needed for severe pain.   oxyCODONE 5 MG immediate release tablet Commonly known as:  Oxy IR/ROXICODONE Take 1 tablet (5 mg total) by mouth 5 (five) times daily as needed for severe pain. Start taking on:  02/02/2017   phentermine 15 MG capsule Take by mouth.   predniSONE 10 MG tablet Commonly known as:  DELTASONE Taper 6-5-4-3-2-1-off   QUEtiapine 100 MG tablet Commonly known as:  SEROQUEL Take 1 tablet (100 mg total) by mouth at bedtime.   ranitidine 150 MG capsule Commonly known as:  ZANTAC Take 150 mg by mouth every evening.   rosuvastatin 40 MG tablet Commonly known as:  CRESTOR Take 40 mg by mouth daily.   sertraline 100 MG tablet Commonly known as:  ZOLOFT Take 2 tablets (200 mg total) by mouth daily.   SINGULAIR 10 MG tablet Generic drug:  montelukast Take 10 mg by mouth at bedtime.   spironolactone 25 MG tablet Commonly known as:  ALDACTONE Take 37.5 mg by mouth 2 (two) times daily.   sucralfate 1 g tablet Commonly known as:  CARAFATE Take 2 g by mouth 2 (two) times daily.   tiZANidine 4 MG tablet Commonly known as:  ZANAFLEX 4 mg at bedtime.   triamcinolone 0.1 % paste Commonly known as:  KENALOG Apply 1 application topically 2 (two) times daily.   TRUE METRIX BLOOD GLUCOSE TEST test strip Generic drug:  glucose blood   vitamin B-12 1000 MCG tablet Commonly known as:  CYANOCOBALAMIN Take by mouth.   Vitamin D (Ergocalciferol) 50000 units Caps capsule Commonly known as:  DRISDOL Take 1 capsule (50,000 Units total) by mouth 2 (two) times a week. X 6 weeks.   zonisamide 50 MG capsule Commonly known as:  ZONEGRAN Take 150 mg by mouth daily. 3 tabs at bedtime       Allergies:  Allergies  Allergen Reactions  . Bactrim [  Sulfamethoxazole-Trimethoprim] Hives  . Xolair [Omalizumab] Itching and Hives  .  Ciprofloxacin Other (See Comments)    Possible myalgias. myalgia Possible myalgias.  . Sulfa Antibiotics   . Aspirin Other (See Comments)    GI UPSET  . Clindamycin Rash  . Motrin [Ibuprofen]     Other reaction(s): Other (See Comments) GI UPSET  . Nsaids Nausea Only and Other (See Comments)    Stomach upset  . Other Hives    Family History: Family History  Problem Relation Age of Onset  . Anxiety disorder Mother   . Depression Mother   . Alcohol abuse Mother   . Diabetes Mother   . Hypertension Mother   . Kidney cancer Mother   . Sleep apnea Mother   . Alcohol abuse Father   . Anxiety disorder Father   . Depression Father   . Post-traumatic stress disorder Father   . Kidney failure Father   . COPD Father   . Diabetes Father   . Hypertension Father   . Sleep apnea Father   . Depression Brother   . Diabetes Brother   . Hypertension Brother   . Sleep apnea Brother   . Breast cancer Paternal Aunt   . Bladder Cancer Neg Hx   . Prostate cancer Neg Hx     Social History:  reports that she has never smoked. She has never used smokeless tobacco. She reports that she does not drink alcohol or use drugs.  ROS: UROLOGY Frequent Urination?: No Hard to postpone urination?: Yes Burning/pain with urination?: Yes Get up at night to urinate?: No Leakage of urine?: Yes Urine stream starts and stops?: Yes Trouble starting stream?: No Do you have to strain to urinate?: Yes Blood in urine?: No Urinary tract infection?: Yes Sexually transmitted disease?: Yes Injury to kidneys or bladder?: No Painful intercourse?: No Weak stream?: No Currently pregnant?: Yes Vaginal bleeding?: No Last menstrual period?: n  Gastrointestinal Nausea?: Yes Vomiting?: No Indigestion/heartburn?: Yes Diarrhea?: Yes Constipation?: No  Constitutional Fever: No Night sweats?: No Weight loss?: No Fatigue?: Yes  Skin Skin rash/lesions?: No Itching?: Yes  Eyes Blurred vision?:  Yes Double vision?: No  Ears/Nose/Throat Sore throat?: Yes Sinus problems?: Yes  Hematologic/Lymphatic Swollen glands?: No Easy bruising?: No  Cardiovascular Leg swelling?: No Chest pain?: Yes  Respiratory Cough?: Yes Shortness of breath?: Yes  Endocrine Excessive thirst?: Yes  Musculoskeletal Back pain?: Yes Joint pain?: Yes  Neurological Headaches?: Yes Dizziness?: Yes  Psychologic Depression?: Yes Anxiety?: No  Physical Exam: BP 129/80   Pulse 99   Ht '5\' 4"'  (1.626 m)   Wt (!) 437 lb 6.4 oz (198.4 kg)   BMI 75.08 kg/m   Constitutional: Well nourished. Alert and oriented, No acute distress. HEENT: [place] AT, moist mucus membranes. Trachea midline, no masses. Cardiovascular: No clubbing, cyanosis, or edema. Respiratory: Normal respiratory effort, no increased work of breathing. GI: Abdomen is soft, non tender, non distended, no abdominal masses. Liver and spleen not palpable.  No hernias appreciated.  Stool sample for occult testing is not indicated.   GU: No CVA tenderness.  No bladder fullness or masses.  Normal external genitalia, normal pubic hair distribution, no lesions.  Normal urethral meatus, no lesions, no prolapse, no discharge.   No urethral masses, tenderness and/or tenderness. No bladder fullness, tenderness or masses. Normal vagina mucosa, good estrogen effect, no discharge, no lesions, good pelvic support, no cystocele or rectocele noted.  Could not palpate cervix, uterus or adnexa due to body habitus.  Anus  and perineum are without rashes or lesions.    Skin: No rashes, bruises or suspicious lesions. Lymph: No cervical or inguinal adenopathy. Neurologic: Grossly intact, no focal deficits, moving all 4 extremities. Psychiatric: Normal mood and affect.  Laboratory Data: Lab Results  Component Value Date   WBC 14.8 (H) 11/12/2015   HGB 12.4 11/12/2015   HCT 38.0 11/12/2015   MCV 79.1 (L) 11/12/2015   PLT 337 11/12/2015    Lab Results   Component Value Date   CREATININE 0.80 05/11/2016       Component Value Date/Time   CHOL 179 05/29/2015 1556   HDL 42 05/29/2015 1556   LDLCALC 102 (H) 05/29/2015 1556    Lab Results  Component Value Date   AST 23 11/24/2015   Lab Results  Component Value Date   ALT 20 11/24/2015     Urinalysis Unremarkable.  See EPIC  Pertinent Imaging: Results for ARSHI, DUARTE (MRN [id number]) as of 01/18/2017 16:06  Ref. Range 01/18/2017 15:51  Scan Result Unknown 13    Assessment & Plan:    1. Recurrent UTI's  - criteria for recurrent UTI has been met with 2 or more infections in 6 months or 3 or greater infections in one year   - Patient is instructed to increase their water intake until the urine is pale yellow or clear (10 to 12 cups daily)   - probiotics (yogurt, oral pills or vaginal suppositories), take cranberry pills or drink the juice and Vitamin C 1,000 mg daily to acidify the urine should be added to their daily regimen - patient had taken them in the past without benefit  - avoid soaking in tubs and wipe front to back after urinating   - advised them to have CATH UA's for urinalysis and culture to prevent skin contamination of the specimen  - reviewed symptoms of UTI and advised not to have urine checked or be treated for UTI if not experiencing symptoms  - discussed antibiotic stewardship with the patient    - post -coital antibiotic is given - Keflex  - RTC in 3 months for recheck                                   Return in about 3 months (around 04/20/2017) for PVR and OAB questionnaire.  These notes generated with voice recognition software. I apologize for typographical errors.  Karen Lewis, [facility] [address], [place], [place] [address] [phone]

## 2017-01-18 ENCOUNTER — Encounter: Payer: Self-pay | Admitting: Urology

## 2017-01-18 ENCOUNTER — Ambulatory Visit (INDEPENDENT_AMBULATORY_CARE_PROVIDER_SITE_OTHER): Payer: Medicare HMO | Admitting: Urology

## 2017-01-18 VITALS — BP 129/80 | HR 99 | Ht 64.0 in | Wt >= 6400 oz

## 2017-01-18 DIAGNOSIS — N390 Urinary tract infection, site not specified: Secondary | ICD-10-CM | POA: Diagnosis not present

## 2017-01-18 LAB — BLADDER SCAN AMB NON-IMAGING: Scan Result: 13

## 2017-01-18 MED ORDER — CEPHALEXIN 250 MG PO CAPS: 250.0000 mg | ORAL_CAPSULE | ORAL | 3 refills | Status: DC | PRN

## 2017-01-19 LAB — URINALYSIS, COMPLETE
Bilirubin, UA: NEGATIVE
GLUCOSE, UA: NEGATIVE
Leukocytes, UA: NEGATIVE
NITRITE UA: NEGATIVE
RBC, UA: NEGATIVE
Specific Gravity, UA: 1.025 (ref 1.005–1.030)
UUROB: 0.2 mg/dL (ref 0.2–1.0)
pH, UA: 6 (ref 5.0–7.5)

## 2017-01-19 LAB — MICROSCOPIC EXAMINATION: Epithelial Cells (non renal): NONE SEEN /hpf (ref 0–10)

## 2017-01-26 ENCOUNTER — Ambulatory Visit: Payer: Medicare HMO | Attending: Pain Medicine | Admitting: Pain Medicine

## 2017-02-03 NOTE — Progress Notes (Signed)
Patient's Name: Karen Lewis  MRN: [id number]  Referring Provider: Idelle Crouch, MD  DOB: 10-Oct-1970  PCP: Idelle Crouch, MD  DOS: 02/17/2017  Note by: Vevelyn Francois NP  Service setting: Ambulatory outpatient  Specialty: Interventional Pain Management  Location: [facility] (AMB) Pain Management Facility    Patient type: Established    Primary Reason(s) for Visit: Encounter for prescription drug management (Level of risk: moderate) CC: Knee Pain; Back Pain; and Hip Pain (right)  HPI  Karen Lewis is a 46 y.o. year old, female patient, who comes today for a medication management evaluation. She has History of asthma; Binge eating disorder; Fibromyalgia; Anxiety, generalized; Insomnia, persistent; Depression, major, recurrent, moderate ([place]); Airway hyperreactivity; Dermatitis, eczematoid; Gout; Hyperlipidemia; Headache, migraine; Obstructive apnea; Bilateral polycystic ovarian syndrome; Apnea, sleep; Disease of thyroid gland; Avitaminosis D; History of surgical procedure; Type 2 diabetes mellitus ([place]); Long term current use of opiate analgesic; Long term prescription opiate use; Opiate use; Encounter for therapeutic drug level monitoring; Opiate dependence ([place]); Essential (primary) hypertension; Goiter, nontoxic, multinodular; Chronic knee pain (Location of Primary Source of Pain) (Bilateral) (R>L); Chronic low back pain (Location of Tertiary source of pain) (Bilateral) (R>L); Lumbar facet syndrome (Bilateral) (R>L); Osteoarthritis; Grade 1 (1.4 cm) Anterolisthesis of L4 over L5; Chronic hip pain (Location of Secondary source of pain) (Right); S/P THR (total hip replacement) (Left); History of methicillin resistant staphylococcus aureus (MRSA); History of bariatric surgery; Hypomagnesemia; Morbid obesity with BMI of 70 and over, adult ([place]) (71.75 on 08/27/2015); History of methicillin resistant Staphylococcus aureus infection; Lumbar spondylosis; Vitamin D insufficiency; Dysphagia; Gastroesophageal reflux  disease without esophagitis; Osteoarthritis of hip (Right); Chest pain with low risk of acute coronary syndrome; Nontoxic goiter; Other psychoactive substance dependence, uncomplicated (HCC); SOB (shortness of breath) on exertion; Chronic pain syndrome; Osteoarthritis of knee (Bilateral) (R>L); and Neurogenic pain on her problem list. Her primarily concern today is the Knee Pain; Back Pain; and Hip Pain (right)  Pain Assessment: Self-Reported Pain Score: 4 /10             Reported level is compatible with observation.       Pain Type: Chronic pain Pain Location: Knee (right hip, lower back) Pain Orientation: Right, Left Pain Descriptors / Indicators: Aching, Burning, Sharp, Tightness Pain Frequency: Constant  Karen Lewis was last scheduled for an appointment on 12/02/2016 for medication management. During today's appointment we reviewed Karen Lewis's chronic pain status, as well as her outpatient medication regimen. She has chronic hip, knee and back pain. She admits that she is hurting everywhere today. She has radicular symptoms that do down into her leg. She has numbness, tingling and weakness. She is concerned that her hip pain is getting worse. She admits that she was seen by orthopedist, Dr. Truitt Leep and she needs surgery on her right hip however secondary to overweight she cannot have surgery at this time. She is concerned about the gabapentin. She was to taper down and then start on Lyrica. She admits that she did try Lyrica some years ago. She did have a reaction that caused her to not remember how she got certain places and she was unable to respond to questions. She is willing to try this again because she believes she was just given to high of a dose too quickly.  The patient  reports that she does not use drugs. Her body mass index is 83.98 kg/m.  Further details on both, my assessment(s), as well as the proposed treatment plan, please see  below.  Controlled Substance Pharmacotherapy  Assessment REMS (Risk Evaluation and Mitigation Strategy)  Analgesic:Oxycodone/APAP 5/325 one every 6 hours when necessary for pain (20 mg/day) MME/day:30 mg/day.  Dewayne Shorter, RN  02/17/2017 11:25 AM  Sign at close encounter Nursing Pain Medication Assessment:  Safety precautions to be maintained throughout the outpatient stay will include: orient to surroundings, keep bed in low position, maintain call bell within reach at all times, provide assistance with transfer out of bed and ambulation.  Medication Inspection Compliance: Pill count conducted under aseptic conditions, in front of the patient. Neither the pills nor the bottle was removed from the patient's sight at any time. Once count was completed pills were immediately returned to the patient in their original bottle.  Medication: Oxycodone IR Pill/Patch Count: 121 of 150 pills remain Pill/Patch Appearance: Markings consistent with prescribed medication Bottle Appearance  05/ 29 / 2018 Last Medication intake:  Today  Patient states she has another prescription at the pharmacy to be filled.    Pharmacokinetics: Liberation and absorption (onset of action): WNL Distribution (time to peak effect): WNL Metabolism and excretion (duration of action): WNL         Pharmacodynamics: Desired effects: Analgesia: Karen Lewis reports >50% benefit. Functional ability: Patient reports that medication allows her to accomplish basic ADLs Clinically meaningful improvement in function (CMIF): Sustained CMIF goals met Perceived effectiveness: Described as relatively effective, allowing for increase in activities of daily living (ADL) Undesirable effects: Side-effects or Adverse reactions: None reported Monitoring: [place] PMP: Online review of the past 64-monthperiod conducted. Compliant with practice rules and regulations List of all UDS test(s) done:  Lab Results  Component Value Date   TOXASSSELUR FINAL 02/23/2016   TOXASSSELUR FINAL 10/02/2015    TOXASSSELUR FINAL 08/27/2015   TOXASSSELUR FINAL 07/29/2015   Last UDS on record: ToxAssure Select 13  Date Value Ref Range Status  02/23/2016 FINAL  Final    Comment:    ==================================================================== TOXASSURE SELECT 13 (MW) ==================================================================== Test                             Result       Flag       Units Drug Present and Declared for Prescription Verification   Amphetamine                    >3676        EXPECTED   ng/mg creat    Amphetamine is available as a schedule II prescription drug.   Oxycodone                      401          EXPECTED   ng/mg creat   Oxymorphone                    31           EXPECTED   ng/mg creat   Noroxycodone                   2608         EXPECTED   ng/mg creat    Sources of oxycodone include scheduled prescription medications.    Oxymorphone and noroxycodone are expected metabolites of    oxycodone. Oxymorphone is also available as a scheduled    prescription medication.   Butalbital  PRESENT      EXPECTED Drug Present not Declared for Prescription Verification   Alcohol, Ethyl                 0.026        UNEXPECTED g/dL    Sources of ethyl alcohol include alcoholic beverages or as a    fermentation product of glucose; glucose was not detected in this    specimen. Ethyl alcohol result should be interpreted in the    context of all available clinical and behavioral information. Drug Absent but Declared for Prescription Verification   Clonazepam                     Not Detected UNEXPECTED ng/mg creat ==================================================================== Test                      Result    Flag   Units      Ref Range   Creatinine              272              mg/dL      >=20 ==================================================================== Declared Medications:  The flagging and interpretation on this report are based on  the  following declared medications.  Unexpected results may arise from  inaccuracies in the declared medications.  **Note: The testing scope of this panel includes these medications:  Amphetamine (Vyvanse)  Butalbital (Esgic)  Butalbital (Fioricet)  Clonazepam (Klonopin)  Oxycodone (Percocet)  Oxycodone (Roxicet)  **Note: The testing scope of this panel does not include following  reported medications:  Acetaminophen (Esgic)  Acetaminophen (Fioricet)  Acetaminophen (Percocet)  Acetaminophen (Roxicet)  Acyclovir  Albuterol  Albuterol (Duoneb)  Amitriptyline (Elavil)  Atorvastatin (Lipitor)  Epinephrine  Fluconazole (Diflucan)  Fluticasone (Advair)  Gabapentin  Ipratropium (Duoneb)  Ketoconazole  Levocetirizine (Xyzal)  Levothyroxine  Magnesium (Mag-Ox)  Meclizine  Medroxyprogesterone (Depo-Provera)  Metoprolol (Lopressor)  Montelukast (Singulair)  Multivitamin  Nystatin  Omeprazole  Quetiapine (Seroquel)  Salmeterol (Advair)  Sertraline (Zoloft)  Spironolactone (Aldactone)  Supplement (OsCal)  Tiotropium (Spiriva)  Tizanidine (Zanaflex)  Triamcinolone (Nasacort)  Vitamin D2 (Ergocalciferol)  Vitamin D3  Zolpidem (Ambien)  Zonisamide ==================================================================== For clinical consultation, please call [phone]. ====================================================================    UDS interpretation: Compliant          Medication Assessment Form: Reviewed. Patient indicates being compliant with therapy Treatment compliance: Compliant Risk Assessment Profile: Aberrant behavior: See prior evaluations. None observed or detected today Comorbid factors increasing risk of overdose: See prior notes. No additional risks detected today Risk of substance use disorder (SUD): Low Opioid Risk Tool (ORT) Total Score: 1  Interpretation Table:  Score <3 = Low Risk for SUD  Score between 4-7 = Moderate Risk for SUD  Score  >8 = High Risk for Opioid Abuse   Risk Mitigation Strategies:  Patient Counseling: Covered Patient-Prescriber Agreement (PPA): Present and active  Notification to other healthcare providers: Done  Pharmacologic Plan: No change in therapy, at this time  Laboratory Chemistry  Inflammation Markers Lab Results  Component Value Date   CRP <0.5 11/24/2015   ESRSEDRATE 36 (H) 11/24/2015   (CRP: Acute Phase) (ESR: Chronic Phase) Renal Function Markers Lab Results  Component Value Date   BUN 8 11/24/2015   CREATININE 0.80 05/11/2016   GFRAA >60 11/24/2015   GFRNONAA >60 11/24/2015   Hepatic Function Markers Lab Results  Component Value Date   AST 23 11/24/2015   ALT 20  11/24/2015   ALBUMIN 3.7 11/24/2015   ALKPHOS 81 11/24/2015   Electrolytes Lab Results  Component Value Date   NA 139 11/24/2015   K 4.3 11/24/2015   CL 105 11/24/2015   CALCIUM 8.6 (L) 11/24/2015   MG 1.6 (L) 11/24/2015   Neuropathy Markers Lab Results  Component Value Date   VITAMINB12 303 11/24/2015   Bone Pathology Markers Lab Results  Component Value Date   ALKPHOS 81 11/24/2015   VD25OH 27.4 (L) 11/24/2015   VD125OH2TOT 41.5 11/24/2015   CALCIUM 8.6 (L) 11/24/2015   Coagulation Parameters Lab Results  Component Value Date   INR 0.95 05/07/2015   LABPROT 12.9 05/07/2015   APTT 26 05/07/2015   PLT 337 11/12/2015   Cardiovascular Markers Lab Results  Component Value Date   HGB 12.4 11/12/2015   HCT 38.0 11/12/2015   Note: Lab results reviewed.  Recent Diagnostic Imaging Review  Dg C-arm 1-60 Min-no Report  Result Date: 01/12/2017 Fluoroscopy was utilized by the requesting physician.  No radiographic interpretation.   Note: Imaging results reviewed.          Meds  The patient has a current medication list which includes the following prescription(s): acetaminophen, acyclovir, albuterol, amitriptyline, azelastine, accu-chek aviva, fifty50 glucose meter 2.0,  butalbital-acetaminophen-caffeine, cephalexin, gabapentin, glipizide, guaifenesin-codeine, insulin pen needle, ipratropium-albuterol, ketoconazole, simple diagnostics lancing dev, levocetirizine, levothyroxine, levothyroxine, magnesium oxide, meclizine, medroxyprogesterone, metformin, metoprolol tartrate, montelukast, multivitamin with minerals, omeprazole, oxycodone, quetiapine, rosuvastatin, sertraline, spironolactone, sucralfate, tizanidine, triamcinolone, triamcinolone, true metrix blood glucose test, ulticare alcohol swabs, vitamin b-12, vitamin d (ergocalciferol), zonisamide, amitriptyline, accu-chek aviva plus, levothyroxine, naloxone, oxycodone, oxycodone, phentermine, pregabalin, and ranitidine.  Current Outpatient Prescriptions on File Prior to Visit  Medication Sig  . Acetaminophen 500 MG coapsule Take 500 mg by mouth every 6 (six) hours as needed.   Marland Kitchen acyclovir (ZOVIRAX) 800 MG tablet Take 800 mg by mouth 3 times/day as needed-between meals & bedtime. Reported on 11/12/2015  . albuterol (PROVENTIL HFA;VENTOLIN HFA) 108 (90 BASE) MCG/ACT inhaler Inhale 2 puffs into the lungs every 6 (six) hours as needed for wheezing or shortness of breath.  Marland Kitchen amitriptyline (ELAVIL) 100 MG tablet Take 100 mg by mouth at bedtime.   Marland Kitchen azelastine (ASTELIN) 0.1 % nasal spray Place 1 spray into the nose 2 (two) times daily.   . Blood Glucose Calibration (ACCU-CHEK AVIVA) SOLN   . Blood Glucose Monitoring Suppl (FIFTY50 GLUCOSE METER 2.0) w/Device KIT Use as directed. E11.0--ACCU CHEK AVIVA  . butalbital-acetaminophen-caffeine (FIORICET, ESGIC) 50-325-40 MG tablet 1 tablet every 6 (six) hours as needed.   . cephALEXin (KEFLEX) 250 MG capsule Take 1 capsule (250 mg total) by mouth as needed. Take one capsule after intercourse  . gabapentin (NEURONTIN) 800 MG tablet Take 1 tablet (800 mg total) by mouth every 6 (six) hours. (Patient taking differently: Take 1,600 mg by mouth 2 (two) times daily. )  . glipiZIDE  (GLUCOTROL) 10 MG tablet Take 10 mg by mouth daily before breakfast.   . guaiFENesin-codeine 100-10 MG/5ML syrup Take by mouth.  . Insulin Pen Needle (FIFTY50 PEN NEEDLES) 31G X 8 MM MISC Use once daily.  Marland Kitchen ipratropium-albuterol (DUONEB) 0.5-2.5 (3) MG/3ML SOLN 3 mLs as needed.   Marland Kitchen ketoconazole (NIZORAL) 2 % cream APPLY AS DIRECTED TO AFFECTED AREA TWICE DAILY AS NEEDED  . levocetirizine (XYZAL) 5 MG tablet Take 5 mg by mouth every evening.   Marland Kitchen levothyroxine (SYNTHROID, LEVOTHROID) 200 MCG tablet Take on an empty stomach each morning before breakfast. Total  dose 250 mcg daily.  Marland Kitchen levothyroxine (SYNTHROID, LEVOTHROID) 50 MCG tablet Take 50 mcg by mouth daily before breakfast.   . magnesium oxide (MAG-OX) 400 MG tablet Take 1 tablet (400 mg total) by mouth 2 (two) times daily.  . meclizine (ANTIVERT) 25 MG tablet Take 1 tablet (25 mg total) by mouth 3 (three) times daily as needed for dizziness.  . medroxyPROGESTERone (DEPO-PROVERA) 150 MG/ML injection Inject 150 mg into the muscle every 3 (three) months.   . metFORMIN (GLUCOPHAGE) 1000 MG tablet Take 1,000 mg by mouth 2 (two) times daily with a meal.   . metoprolol (LOPRESSOR) 50 MG tablet Take 75 mg by mouth 2 (two) times daily.   . montelukast (SINGULAIR) 10 MG tablet Take 10 mg by mouth at bedtime.   . Multiple Vitamin (MULTIVITAMIN WITH MINERALS) TABS tablet Take 2 tablets by mouth daily. Reported on 11/12/2015  . omeprazole (PRILOSEC) 40 MG capsule Take 40 mg by mouth 2 (two) times daily.   . QUEtiapine (SEROQUEL) 100 MG tablet Take 1 tablet (100 mg total) by mouth at bedtime.  . rosuvastatin (CRESTOR) 40 MG tablet Take 40 mg by mouth daily.   . sertraline (ZOLOFT) 100 MG tablet Take 2 tablets (200 mg total) by mouth daily.  Marland Kitchen spironolactone (ALDACTONE) 25 MG tablet Take 37.5 mg by mouth 2 (two) times daily.   . sucralfate (CARAFATE) 1 g tablet Take 2 g by mouth 2 (two) times daily.   Marland Kitchen tiZANidine (ZANAFLEX) 4 MG tablet 4 mg at bedtime.   .  triamcinolone (KENALOG) 0.1 % paste Apply 1 application topically 2 (two) times daily.   Marland Kitchen triamcinolone (NASACORT AQ) 55 MCG/ACT AERO nasal inhaler 2 sprays by Nasal route 2 (two) times daily.  . TRUE METRIX BLOOD GLUCOSE TEST test strip   . vitamin B-12 (CYANOCOBALAMIN) 1000 MCG tablet Take by mouth.  . Vitamin D, Ergocalciferol, (DRISDOL) 50000 units CAPS capsule Take 1 capsule (50,000 Units total) by mouth 2 (two) times a week. X 6 weeks.  Marland Kitchen zonisamide (ZONEGRAN) 50 MG capsule Take 150 mg by mouth daily. 3 tabs at bedtime  . amitriptyline (ELAVIL) 100 MG tablet TAKE ONE TABLET BY MOUTH AT BEDTIME  . Blood Glucose Monitoring Suppl (ACCU-CHEK AVIVA PLUS) w/Device KIT   . levothyroxine (SYNTHROID, LEVOTHROID) 100 MCG tablet Take on an empty stomach each morning before breakfast. Total dose 225 mcg daily.  . phentermine 15 MG capsule Take by mouth.  . ranitidine (ZANTAC) 150 MG capsule Take 150 mg by mouth every evening.    No current facility-administered medications on file prior to visit.    ROS  Constitutional: Denies any fever or chills Gastrointestinal: No reported hemesis, hematochezia, vomiting, or acute GI distress Musculoskeletal: Denies any acute onset joint swelling, redness, loss of ROM, or weakness Neurological: No reported episodes of acute onset apraxia, aphasia, dysarthria, agnosia, amnesia, paralysis, loss of coordination, or loss of consciousness  Allergies  Karen Lewis is allergic to bactrim [sulfamethoxazole-trimethoprim]; xolair [omalizumab]; ciprofloxacin; aspirin; clindamycin; motrin [ibuprofen]; nsaids; other; and sulfa antibiotics.  [place]  Drug: Karen Lewis  reports that she does not use drugs. Alcohol:  reports that she does not drink alcohol. Tobacco:  reports that she has never smoked. She has never used smokeless tobacco. Medical:  has a past medical history of Anemia; Anginal pain ([place]); Anxiety; Arthralgia of hip (07/29/2015); Arthritis; Arthritis, degenerative  (07/29/2015); Asthma; Cephalalgia (07/25/2014); Dependence on unknown drug ([place]); Depression; Diabetes mellitus without complication ([place]); Dysrhythmia; Eczema; Fibromyalgia; Gastritis; GERD (gastroesophageal  reflux disease); Gonalgia (07/29/2015); Gout; H/O cardiovascular disorder (03/10/2015); H/O surgical procedure (12/05/2012); H/O thyroid disease (03/10/2015); Headache; Herpes; History of artificial joint (07/29/2015); History of hiatal hernia; Hypertension; Hypomagnesemia; Hypothyroidism; LBP (low back pain) (07/29/2015); Neuromuscular disorder ([place]); Obesity; PCOS (polycystic ovarian syndrome); Primary osteoarthritis of both knees (07/29/2015); Sleep apnea; and Thyroid nodule (bilateral). Family: family history includes Alcohol abuse in her father and mother; Anxiety disorder in her father and mother; Breast cancer in her paternal aunt; COPD in her father; Depression in her brother, father, and mother; Diabetes in her brother, father, and mother; Hypertension in her brother, father, and mother; Kidney cancer in her mother; Kidney failure in her father; Post-traumatic stress disorder in her father; Sleep apnea in her brother, father, and mother.  Past Surgical History:  Procedure Laterality Date  . CARPAL TUNNEL RELEASE Bilateral   . CHOLECYSTECTOMY    . DIAGNOSTIC LAPAROSCOPY    . JOINT REPLACEMENT Left hip  . LAPAROSCOPIC PARTIAL GASTRECTOMY    . left trigger finger    . SHOULDER ARTHROSCOPY Right   . THYROIDECTOMY N/A 11/12/2015   Procedure: THYROIDECTOMY;  Surgeon: Clyde Canterbury, MD;  Location: [facility] ORS;  Service: ENT;  Laterality: N/A;  . TRIGGER FINGER RELEASE Right    Constitutional Exam  General appearance: Well nourished, well developed, and well hydrated. In no apparent acute distress Vitals:   02/17/17 1111  BP: 139/85  Pulse: 99  Resp: 18  Temp: 98.4 F (36.9 C)  SpO2: 98%  Weight: (!) 430 lb (195 kg)  Height: 5' (1.524 m)   BMI Assessment: Estimated body mass index is 83.98  kg/m as calculated from the following:   Height as of this encounter: 5' (1.524 m).   Weight as of this encounter: 430 lb (195 kg).  BMI interpretation table: BMI level Category Range association with higher incidence of chronic pain  <18 kg/m2 Underweight   18.5-24.9 kg/m2 Ideal body weight   25-29.9 kg/m2 Overweight Increased incidence by 20%  30-34.9 kg/m2 Obese (Class I) Increased incidence by 68%  35-39.9 kg/m2 Severe obesity (Class II) Increased incidence by 136%  >40 kg/m2 Extreme obesity (Class III) Increased incidence by 254%   BMI Readings from Last 4 Encounters:  02/17/17 83.98 kg/m  02/09/17 73.81 kg/m  01/18/17 75.08 kg/m  01/12/17 72.44 kg/m   Wt Readings from Last 4 Encounters:  02/17/17 (!) 430 lb (195 kg)  02/09/17 (!) 430 lb (195 kg)  01/18/17 (!) 437 lb 6.4 oz (198.4 kg)  01/12/17 (!) 422 lb (191.4 kg)  Psych/Mental status: Alert, oriented x 3 (person, place, & time)       Eyes: PERLA Respiratory: No evidence of acute respiratory distress  Lumbar Spine Exam  Inspection: No masses, redness, or swelling Alignment: Symmetrical Functional ROM: Unrestricted ROM      Stability: No instability detected Muscle strength & Tone: Functionally intact Sensory: Unimpaired Palpation: No palpable anomalies       Provocative Tests: Lumbar Hyperextension and rotation test: evaluation deferred today       Patrick's Maneuver: evaluation deferred today                    Gait & Posture Assessment  Ambulation: Unassisted Gait: Relatively normal for age and body habitus Posture: WNL   Lower Extremity Exam    Side: Right lower extremity  Side: Left lower extremity  Inspection: No masses, redness, swelling, or asymmetry. No contractures  Inspection: No masses, redness, swelling, or asymmetry. No contractures  Functional ROM:  Unrestricted ROM          Functional ROM: Unrestricted ROM          Muscle strength & Tone: Functionally intact  Muscle strength & Tone:  Functionally intact  Sensory: Unimpaired  Sensory: Unimpaired  Palpation: No palpable anomalies  Palpation: No palpable anomalies   Assessment  Primary Diagnosis & Pertinent Problem List: The primary encounter diagnosis was Chronic knee pain (Location of Primary Source of Pain) (Bilateral) (R>L). Diagnoses of Chronic hip pain (Location of Secondary source of pain) (Right), Fibromyalgia, Neurogenic pain, Chronic pain syndrome, and Long term current use of opiate analgesic were also pertinent to this visit.  Status Diagnosis  Controlled Controlled Controlled 1. Chronic knee pain (Location of Primary Source of Pain) (Bilateral) (R>L)   2. Chronic hip pain (Location of Secondary source of pain) (Right)   3. Fibromyalgia   4. Neurogenic pain   5. Chronic pain syndrome   6. Long term current use of opiate analgesic     Problems updated and reviewed during this visit: No problems updated. Plan of Care  Pharmacotherapy (Medications Ordered): Meds ordered this encounter  Medications  . oxyCODONE (OXY IR/ROXICODONE) 5 MG immediate release tablet    Sig: Take 1 tablet (5 mg total) by mouth 5 (five) times daily as needed for severe pain.    Dispense:  150 tablet    Refill:  0    Fill one day early if pharmacy is closed on scheduled refill date. Do not fill until: 03/04/17 To last until: 04/03/17    Order Specific Question:   Supervising Provider    Answer:   Milinda Pointer [phone]  . oxyCODONE (OXY IR/ROXICODONE) 5 MG immediate release tablet    Sig: Take 1 tablet (5 mg total) by mouth 5 (five) times daily as needed for severe pain.    Dispense:  150 tablet    Refill:  0    Fill one day early if pharmacy is closed on scheduled refill date. Do not fill until: 04/03/17 To last until: 05/03/17    Order Specific Question:   Supervising Provider    Answer:   Milinda Pointer [phone]  . oxyCODONE (OXY IR/ROXICODONE) 5 MG immediate release tablet    Sig: Take 1 tablet (5 mg total) by  mouth 5 (five) times daily as needed for severe pain.    Dispense:  150 tablet    Refill:  0    Fill one day early if pharmacy is closed on scheduled refill date. Do not fill until: 05/03/17 To last until: 06/02/17    Order Specific Question:   Supervising Provider    Answer:   Milinda Pointer [phone]  . pregabalin (LYRICA) 25 MG capsule    Sig: Take 1 capsule (25 mg total) by mouth 3 (three) times daily.    Dispense:  90 capsule    Refill:  0    Do not place this medication, or any other prescription from our practice, on "Automatic Refill". Patient may have prescription filled one day early if pharmacy is closed on scheduled refill date.    Order Specific Question:   Supervising Provider    Answer:   Milinda Pointer [phone]  . naloxone (NARCAN) nasal spray 4 mg/0.1 mL    Sig: Spray into one nostril. Repeat with second device into other nostril after 2-3 minutes if no or minimal response.    Dispense:  1 kit    Refill:  0    Narcan Nasal Spray. (2 pack)  Please provide the patient with clear instructions on the use of this device/medication.    Order Specific Question:   Supervising Provider    Answer:   Milinda Pointer [phone]   New Prescriptions   NALOXONE (NARCAN) NASAL SPRAY 4 MG/0.1 ML    Spray into one nostril. Repeat with second device into other nostril after 2-3 minutes if no or minimal response.   PREGABALIN (LYRICA) 25 MG CAPSULE    Take 1 capsule (25 mg total) by mouth 3 (three) times daily.   Medications administered today: Karen Lewis had no medications administered during this visit. Lab-work, procedure(s), and/or referral(s): Orders Placed This Encounter  Procedures  . ToxASSURE Select 13 (MW), Urine   Imaging and/or referral(s): None  Interventional therapies: Planned, scheduled, and/or pending:   Not at this time.   Considering:   Series of 5 Hyalgan knee injections    Palliative PRN treatment(s):   Intra-articular Hyalgan knee injections.   Palliative bilateral genicular nerve block  Palliative bilateral lumbar facet block    Provider-requested follow-up: Return in about 3 months (around 05/20/2017) for Medication Mgmt.  Future Appointments Date Time Provider [place]  04/25/2017 1:45 PM Nori Riis, PA-C [facility] None  05/18/2017 11:00 AM Vevelyn Francois, NP [facility] None   Primary Care Physician: Idelle Crouch, MD Location: [facility] Note by: Vevelyn Francois NP Date: 02/17/2017; Time: 3:08 PM  Pain Score Disclaimer: We use the NRS-11 scale. This is a self-reported, subjective measurement of pain severity with only modest accuracy. It is used primarily to identify changes within a particular patient. It must be understood that outpatient pain scales are significantly less accurate that those used for research, where they can be applied under ideal controlled circumstances with minimal exposure to variables. In reality, the score is likely to be a combination of pain intensity and pain affect, where pain affect describes the degree of emotional arousal or changes in action readiness caused by the sensory experience of pain. Factors such as social and work situation, setting, emotional state, anxiety levels, expectation, and prior pain experience may influence pain perception and show large inter-individual differences that may also be affected by time variables.  Patient instructions provided during this appointment: Patient Instructions   ____________________________________________________________________________________________  Medication Rules  Applies to: All patients receiving prescriptions (written or electronic).  Pharmacy of record: Pharmacy where electronic prescriptions will be sent. If written prescriptions are taken to a different pharmacy, please inform the nursing staff. The pharmacy listed in the electronic medical record should be the one where you would like  electronic prescriptions to be sent.  Prescription refills: Only during scheduled appointments. Applies to both, written and electronic prescriptions.  NOTE: The following applies primarily to controlled substances (Opioid Pain Medications)  Patient's responsibilities: 1. Pain Pills: Bring all pain pills to every appointment (except for procedure appointments). 2. Pill Bottles: Bring pills in original pharmacy bottle. Always bring newest bottle. Bring bottle, even if empty. 3. Medication refills: You are responsible for knowing and keeping track of what medications you need refilled. The day before your appointment, write a list of all prescriptions that need to be refilled. Bring that list to your appointment and give it to the admitting nurse. Prescriptions will be written only during appointments. If you forget a medication, it will not be "Called in", "Faxed", or "electronically sent". You will need to get another appointment to get these prescribed. 4. Prescription Accuracy: You are responsible for carefully inspecting  your prescriptions before leaving our office. Have the discharge nurse carefully go over each prescription with you, before taking them home. Make sure that your name is accurately spelled, that your address is correct. Check the name and dose of your medication to make sure it is accurate. Check the number of pills, and the written instructions to make sure they are clear and accurate. Make sure that you are given enough medication to last until your next medication refill appointment. 5. Taking Medication: Take medication as prescribed. Never take more pills than instructed. Never take medication more frequently than prescribed. Taking less pills or less frequently is permitted and encouraged, when it comes to controlled substances (written prescriptions).  6. Inform other Doctors: Always inform, all of your healthcare providers, of all the medications you take. 7. Pain Medication  from other Providers: You are not allowed to accept any additional pain medication from any other Doctor or Healthcare provider. There are two exceptions to this rule. (see below) In the event that you require additional pain medication, you are responsible for notifying us, as stated below. 8. Medication Agreement: You are responsible for carefully reading and following our Medication Agreement. This must be signed before receiving any prescriptions from our practice. Safely store a copy of your signed Agreement. Violations to the Agreement will result in no further prescriptions. (Additional copies of our Medication Agreement are available upon request.) 9. Laws, Rules, & Regulations: All patients are expected to follow all Federal and [employer], Rules, [employer]. Ignorance of the Laws does not constitute a valid excuse.  Exceptions: There are only two exceptions to the rule of not receiving pain medications from other Healthcare Providers. 1. Exception #1 (Emergencies): In the event of an emergency (i.e.: accident requiring emergency care), you are allowed to receive additional pain medication. However, you are responsible for: As soon as you are able, call our office [phone], at any time of the day or night, and leave a message stating your name, the date and nature of the emergency, and the name and dose of the medication prescribed. In the event that your call is answered by a member of our staff, make sure to document and save the date, time, and the name of the person that took your information.  2. Exception #2 (Planned Surgery): In the event that you are scheduled by another doctor or dentist to have any type of surgery or procedure, you are allowed (for a period no longer than 30 days), to receive additional pain medication, for the acute post-op pain. However, in this case, you are responsible for picking up a copy of our "Post-op Pain Management for Surgeons" handout, and  giving it to your surgeon or dentist. This document is available at our office, and does not require an appointment to obtain it. Simply go to our office during business hours (Monday-Thursday from 8:00 AM to 4:00 PM) (Friday 8:00 AM to 12:00 Noon) or if you have a scheduled appointment with [place], prior to your surgery, and ask for it by name. In addition, you will need to provide [place] with your name, name of your surgeon, type of surgery, and date of procedure or surgery.  ____________________________________________________________________________________________

## 2017-02-09 ENCOUNTER — Ambulatory Visit (HOSPITAL_BASED_OUTPATIENT_CLINIC_OR_DEPARTMENT_OTHER): Payer: Medicare HMO | Admitting: Pain Medicine

## 2017-02-09 ENCOUNTER — Encounter: Payer: Self-pay | Admitting: Pain Medicine

## 2017-02-09 ENCOUNTER — Ambulatory Visit
Admission: RE | Admit: 2017-02-09 | Discharge: 2017-02-09 | Disposition: A | Discharge status: Home / Self Care | Payer: Medicare HMO | Source: Ambulatory Visit | Attending: Pain Medicine | Admitting: Pain Medicine

## 2017-02-09 VITALS — BP 114/74 | HR 88 | Temp 99.3°F | Resp 25 | Ht 64.0 in | Wt >= 6400 oz

## 2017-02-09 DIAGNOSIS — Z883 Allergy status to other anti-infective agents status: Secondary | ICD-10-CM | POA: Insufficient documentation

## 2017-02-09 DIAGNOSIS — M25562 Pain in left knee: Secondary | ICD-10-CM | POA: Diagnosis not present

## 2017-02-09 DIAGNOSIS — G8929 Other chronic pain: Secondary | ICD-10-CM | POA: Diagnosis not present

## 2017-02-09 DIAGNOSIS — Z886 Allergy status to analgesic agent status: Secondary | ICD-10-CM | POA: Insufficient documentation

## 2017-02-09 DIAGNOSIS — M150 Primary generalized (osteo)arthritis: Secondary | ICD-10-CM

## 2017-02-09 DIAGNOSIS — M159 Polyosteoarthritis, unspecified: Secondary | ICD-10-CM

## 2017-02-09 DIAGNOSIS — M25561 Pain in right knee: Secondary | ICD-10-CM | POA: Insufficient documentation

## 2017-02-09 DIAGNOSIS — M170 Bilateral primary osteoarthritis of knee: Secondary | ICD-10-CM | POA: Insufficient documentation

## 2017-02-09 MED ORDER — ROPIVACAINE HCL 2 MG/ML IJ SOLN
INTRAMUSCULAR | Status: AC
  Filled 2017-02-09: qty 10

## 2017-02-09 MED ORDER — LIDOCAINE HCL (PF) 1 % IJ SOLN
5.0000 mL | Freq: Once | INTRAMUSCULAR | Status: AC
  Administered 2017-02-09: 5 mL

## 2017-02-09 MED ORDER — LIDOCAINE HCL (PF) 1 % IJ SOLN
INTRAMUSCULAR | Status: AC
  Filled 2017-02-09: qty 10

## 2017-02-09 MED ORDER — SODIUM HYALURONATE (VISCOSUP) 20 MG/2ML IX SOSY
2.0000 mL | PREFILLED_SYRINGE | Freq: Once | INTRA_ARTICULAR | Status: AC
  Administered 2017-02-09: 2 mL via INTRA_ARTICULAR

## 2017-02-09 MED ORDER — ROPIVACAINE HCL 2 MG/ML IJ SOLN
5.0000 mL | Freq: Once | INTRAMUSCULAR | Status: AC
  Administered 2017-02-09: 10 mL via INTRA_ARTICULAR

## 2017-02-09 NOTE — Patient Instructions (Addendum)
____________________________________________________________________________________________  Appointment Policy  It is our goal and responsibility to provide the medical community with assistance in the evaluation and management of patients with chronic pain. Unfortunately our resources are limited. Because we do not have an unlimited amount of time, or available appointments, we are required to closely monitor and manage their use. The following rules exist to maximize their use:  Patient's responsibilities: 1. Punctuality: You are required to be physically present and registered in our facility at least 30 minutes before your appointment. 2. Tardiness: The cutoff is your appointment time. If you have an appointment scheduled for 10:00 AM and you arrive at 10:01, you will be required to reschedule your appointment.  3. Plan ahead: Always assume that you will encounter traffic on your way in. Plan for it. If you are dependent on a driver, make sure they understand these rules and the need to arrive early. 4. Other appointments and responsibilities: Avoid scheduling any other appointments before or after your pain clinic appointments.  5. Be prepared: Write down everything that you need to discuss with your healthcare provider and give this information to the admitting nurse. Write down the medications that you will need refilled. Bring your pills and bottles (even the empty ones), to all of your appointments, except for those where a procedure is scheduled. 6. No children or pets: Find someone to take care of them. It is not appropriate to bring them in. 7. Scheduling changes: We request "advanced notification" of any changes or cancellations. 8. Advanced notification: Defined as a time period of more than 24 hours prior to the originally scheduled appointment. This allows for the appointment to be offered to other patients. 9. Rescheduling: When a visit is rescheduled, it will require the  cancellation of the original appointment. For this reason they both fall within the category of "Cancellations".  10. Cancellations: They require advanced notification. Any cancellation less than 24 hours before the  appointment will be recorded as a "No Show". 11. No Show: Defined as an unkept appointment where the patient failed to notify or declare to the practice their intention or inability to keep the appointment.  Corrective process for repeat offenders:  1. Tardiness: Three (3) episodes of rescheduling due to late arrivals will be recorded as one (1) "No Show". 2. Cancellation or reschedule: Three (3) cancellations or rescheduling will be recorded as one (1) "No Show". 3. "No Shows": Three (3) "No Shows" within a 12 month period will result in discharge from the practice.  ____________________________________________________________________________________________  ____________________________________________________________________________________________  Post-Procedure instructions Instructions:  Apply ice: Fill a plastic sandwich bag with crushed ice. Cover it with a small towel and apply to injection site. Apply for 15 minutes then remove x 15 minutes. Repeat sequence on day of procedure, until you go to bed. The purpose is to minimize swelling and discomfort after procedure.  Apply heat: Apply heat to procedure site starting the day following the procedure. The purpose is to treat any soreness and discomfort from the procedure.  Food intake: Start with clear liquids (like water) and advance to regular food, as tolerated.   Physical activities: Keep activities to a minimum for the first 8 hours after the procedure.   Driving: If you have received any sedation, you are not allowed to drive for 24 hours after your procedure.  Blood thinner: Restart your blood thinner 6 hours after your procedure. (Only for those taking blood thinners)  Insulin: As soon as you can eat, you may  resume  your normal dosing schedule. (Only for those taking insulin)  Infection prevention: Keep procedure site clean and dry.  Post-procedure Pain Diary: Extremely important that this be done correctly and accurately. Recorded information will be used to determine the next step in treatment.  Pain evaluated is that of treated area only. Do not include pain from an untreated area.  Complete every hour, on the hour, for the initial 8 hours. Set an alarm to help you do this part accurately.  Do not go to sleep and have it completed later. It will not be accurate.  Follow-up appointment: Keep your follow-up appointment after the procedure. Usually 2 weeks for most procedures. (6 weeks in the case of radiofrequency.) Bring you pain diary.  Expect:  From numbing medicine (AKA: Local Anesthetics): Numbness or decrease in pain.  Onset: Full effect within 15 minutes of injected.  Duration: It will depend on the type of local anesthetic used. On the average, 1 to 8 hours.   From steroids: Decrease in swelling or inflammation. Once inflammation is improved, relief of the pain will follow.  Onset of benefits: Depends on the amount of swelling present. The more swelling, the longer it will take for the benefits to be seen.   Duration: Steroids will stay in the system x 2 weeks. Duration of benefits will depend on multiple posibilities including persistent irritating factors.  From procedure: Some discomfort is to be expected once the numbing medicine wears off. This should be minimal if ice and heat are applied as instructed. Call if:  You experience numbness and weakness that gets worse with time, as opposed to wearing off.  New onset bowel or bladder incontinence. (Spinal procedures only)  Emergency Numbers:  Durning business hours (Monday - Thursday, 8:00 AM - 4:00 PM) (Friday, 9:00 AM - 12:00 Noon): [phone]  After hours: [phone]  [phone] ____________________________________________________________________________________________  Pain Management Discharge Instructions  General Discharge Instructions :  If you need to reach your doctor call: Monday-Friday 8:00 am - 4:00 pm at [phone] or toll free [phone].  After clinic hours [phone] to have operator reach doctor.  Bring all of your medication bottles to all your appointments in the pain clinic.  To cancel or reschedule your appointment with Pain Management please remember to call 24 hours in advance to avoid a fee.  Refer to the educational materials which you have been given on: General Risks, I had my Procedure. Discharge Instructions, Post Sedation.  Post Procedure Instructions:  The drugs you were given will stay in your system until tomorrow, so for the next 24 hours you should not drive, make any legal decisions or drink any alcoholic beverages.  You may eat anything you prefer, but it is better to start with liquids then soups and crackers, and gradually work up to solid foods.  Please notify your doctor immediately if you have any unusual bleeding, trouble breathing or pain that is not related to your normal pain.  Depending on the type of procedure that was done, some parts of your body may feel week and/or numb.  This usually clears up by tonight or the next day.  Walk with the use of an assistive device or accompanied by an adult for the 24 hours.  You may use ice on the affected area for the first 24 hours.  Put ice in a Ziploc bag and cover with a towel and place against area 15 minutes on 15 minutes off.  You may switch to heat after 24 hours.

## 2017-02-09 NOTE — Progress Notes (Signed)
Patient's Name: Karen Lewis  MRN: [id number]  Referring Provider: Idelle Crouch, MD  DOB: [birth year]  PCP: Karen Crouch, MD  DOS: 02/09/2017  Note by: Karen Lewis. Karen Arbour, MD  Service setting: Ambulatory outpatient  Location: [facility] (AMB) Pain Management Facility  Visit type: Procedure  Specialty: Interventional Pain Management  Patient type: Established   Primary Reason for Visit: Interventional Pain Management Treatment. CC: Knee Pain (bilateral)  Procedure:  Anesthesia, Analgesia, Anxiolysis:  Type: Therapeutic Intra-Articular Hyalgan Knee Injection #5 Region: Lateral infrapatellar Knee Region Level: Knee Joint Laterality: Bilateral  Type: Local Anesthesia Local Anesthetic: Lidocaine 1% Route: Infiltration ([place]/IM) IV Access: Declined Sedation: Declined  Indication(s): Analgesia          Indications: 1. Osteoarthritis of knee (Bilateral) (R>L)   2. Chronic knee pain (Location of Primary Source of Pain) (Bilateral) (R>L)   3. Osteoarthritis    Pain Score: Pre-procedure: 2 /10 Post-procedure:  (right knee = 1, left knee = 0)/10  Pre-op Assessment:  Previous date of service: 01/12/17 Service provided: Procedure (bilateral hyalgan knee injection) Karen Lewis is a 46 y.o. (year old), female patient, seen today for interventional treatment. She  has a past surgical history that includes Laparoscopic partial gastrectomy; Shoulder arthroscopy (Right); Joint replacement (Left, hip); Carpal tunnel release (Bilateral); Diagnostic laparoscopy; Cholecystectomy; Trigger finger release (Right); Thyroidectomy (N/A, 11/12/2015); and left trigger finger. Her primarily concern today is the Knee Pain (bilateral)  Initial Vital Signs: There were no vitals taken for this visit. BMI: 73.81 kg/m  Risk Assessment: Allergies: Reviewed. She is allergic to bactrim [sulfamethoxazole-trimethoprim]; xolair [omalizumab]; ciprofloxacin; sulfa antibiotics; aspirin; clindamycin; motrin [ibuprofen]; nsaids; and  other.  Allergy Precautions: None required Coagulopathies: Reviewed. None identified.  Blood-thinner therapy: None at this time Active Infection(s): Reviewed. None identified. Karen Lewis is afebrile  Site Confirmation: Karen Lewis was asked to confirm the procedure and laterality before marking the site Procedure checklist: Completed Consent: Before the procedure and under the influence of no sedative(s), amnesic(s), or anxiolytics, the patient was informed of the treatment options, risks and possible complications. To fulfill our ethical and legal obligations, as recommended by the American Medical Association's Code of Ethics, I have informed the patient of my clinical impression; the nature and purpose of the treatment or procedure; the risks, benefits, and possible complications of the intervention; the alternatives, including doing nothing; the risk(s) and benefit(s) of the alternative treatment(s) or procedure(s); and the risk(s) and benefit(s) of doing nothing. The patient was provided information about the general risks and possible complications associated with the procedure. These may include, but are not limited to: failure to achieve desired goals, infection, bleeding, organ or nerve damage, allergic reactions, paralysis, and death. In addition, the patient was informed of those risks and complications associated to the procedure, such as failure to decrease pain; infection; bleeding; organ or nerve damage with subsequent damage to sensory, motor, and/or autonomic systems, resulting in permanent pain, numbness, and/or weakness of one or several areas of the body; allergic reactions; (i.e.: anaphylactic reaction); and/or death. Furthermore, the patient was informed of those risks and complications associated with the medications. These include, but are not limited to: allergic reactions (i.e.: anaphylactic or anaphylactoid reaction(s)); adrenal axis suppression; blood sugar elevation that in  diabetics may result in ketoacidosis or comma; water retention that in patients with history of congestive heart failure may result in shortness of breath, pulmonary edema, and decompensation with resultant heart failure; weight gain; swelling or edema; medication-induced neural toxicity; particulate matter embolism  and blood vessel occlusion with resultant organ, and/or nervous system infarction; and/or aseptic necrosis of one or more joints. Finally, the patient was informed that Medicine is not an exact science; therefore, there is also the possibility of unforeseen or unpredictable risks and/or possible complications that may result in a catastrophic outcome. The patient indicated having understood very clearly. We have given the patient no guarantees and we have made no promises. Enough time was given to the patient to ask questions, all of which were answered to the patient's satisfaction. Karen Lewis has indicated that she wanted to continue with the procedure. Attestation: I, the ordering provider, attest that I have discussed with the patient the benefits, risks, side-effects, alternatives, likelihood of achieving goals, and potential problems during recovery for the procedure that I have provided informed consent. Date: 02/09/2017; Time: 9:43 AM  Pre-Procedure Preparation:  Monitoring: As per clinic protocol. Respiration, ETCO2, SpO2, BP, heart rate and rhythm monitor placed and checked for adequate function Safety Precautions: Patient was assessed for positional comfort and pressure points before starting the procedure. Time-out: I initiated and conducted the "Time-out" before starting the procedure, as per protocol. The patient was asked to participate by confirming the accuracy of the "Time Out" information. Verification of the correct person, site, and procedure were performed and confirmed by me, the nursing staff, and the patient. "Time-out" conducted as per Joint Commission's Universal Protocol  (UP.01.01.01). "Time-out" Date & Time: 02/09/2017; 1044 hrs.  Description of Procedure Process:   Position: Sitting Target Area: Knee Joint Approach: Just above the Lateral tibial plateau, lateral to the infrapatellar tendon. Area Prepped: Entire knee area, from the mid-thigh to the mid-shin. Prepping solution: ChloraPrep (2% chlorhexidine gluconate and 70% isopropyl alcohol) Safety Precautions: Aspiration looking for blood return was conducted prior to all injections. At no point did we inject any substances, as a needle was being advanced. No attempts were made at seeking any paresthesias. Safe injection practices and needle disposal techniques used. Medications properly checked for expiration dates. SDV (single dose vial) medications used. Description of the Procedure: Protocol guidelines were followed. The patient was placed in position over the fluoroscopy table. The target area was identified and the area prepped in the usual manner. Skin desensitized using vapocoolant spray. Skin & deeper tissues infiltrated with local anesthetic. Appropriate amount of time allowed to pass for local anesthetics to take effect. The procedure needles were then advanced to the target area. Proper needle placement secured. Negative aspiration confirmed. Solution injected in intermittent fashion, asking for systemic symptoms every 0.5cc of injectate. The needles were then removed and the area cleansed, making sure to leave some of the prepping solution back to take advantage of its long term bactericidal properties. Vitals:   02/09/17 1032 02/09/17 1037 02/09/17 1042 02/09/17 1050  BP: 118/86 (!) 109/95 114/74 114/74  Pulse:      Resp: 20 18 (!) 23 (!) 25  Temp:      TempSrc:      SpO2: 99% 97% 98% 99%  Weight:      Height:        Start Time: 1045 hrs. End Time: 1050 hrs. Materials:  Needle(s) Type: Regular needle Gauge: 22G Length: 3.5-in Medication(s): We administered Sodium Hyaluronate, Sodium  Hyaluronate, lidocaine (PF), and ropivacaine (PF) 2 mg/mL (0.2%). Please see chart orders for dosing details.  Imaging Guidance:  Type of Imaging Technique: Fluoroscopy Guidance (Non-spinal) Indication(s): Assistance in needle guidance and placement for procedures requiring needle placement in or near specific anatomical locations  not easily accessible without such assistance. Exposure Time: Please see nurses notes. Contrast: None used. Fluoroscopic Guidance: I was personally present during the use of fluoroscopy. "Tunnel Vision Technique" used to obtain the best possible view of the target area. Parallax error corrected before commencing the procedure. "Direction-depth-direction" technique used to introduce the needle under continuous pulsed fluoroscopy. Once target was reached, antero-posterior, oblique, and lateral fluoroscopic projection used confirm needle placement in all planes. Images permanently stored in EMR. Ultrasound Guidance: N/A Interpretation: No contrast injected. I personally interpreted the imaging intraoperatively. Adequate needle placement confirmed in multiple planes. Permanent images saved into the patient's record.  Antibiotic Prophylaxis:  Indication(s): None identified Antibiotic given: None  Post-operative Assessment:  EBL: None Complications: No immediate post-treatment complications observed by team, or reported by patient. Note: The patient tolerated the entire procedure well. A repeat set of vitals were taken after the procedure and the patient was kept under observation following institutional policy, for this type of procedure. Post-procedural neurological assessment was performed, showing return to baseline, prior to discharge. The patient was provided with post-procedure discharge instructions, including a section on how to identify potential problems. Should any problems arise concerning this procedure, the patient was given instructions to immediately contact  us, at any time, without hesitation. In any case, we plan to contact the patient by telephone for a follow-up status report regarding this interventional procedure. Comments:  No additional relevant information.  Plan of Care  Disposition: Discharge home  Discharge Date & Time: 02/09/2017; 1059 hrs.  Physician-requested Follow-up:  Return for keep scheduled appointment.  Future Appointments Date Time Provider [place]  02/17/2017 11:00 AM Vevelyn Francois, NP [facility] None  04/25/2017 1:45 PM McGowan, Larene Beach A, PA-C [facility] None   Medications ordered for procedure: Meds ordered this encounter  Medications  . Sodium Hyaluronate SOSY 2 mL  . Sodium Hyaluronate SOSY 2 mL  . lidocaine (PF) (XYLOCAINE) 1 % injection 5 mL  . ropivacaine (PF) 2 mg/mL (0.2%) (NAROPIN) injection 5 mL   Medications administered: We administered Sodium Hyaluronate, Sodium Hyaluronate, lidocaine (PF), and ropivacaine (PF) 2 mg/mL (0.2%).  See the medical record for exact dosing, route, and time of administration.  Lab-work, Procedure(s), & Referral(s) Ordered: Orders Placed This Encounter  Procedures  . KNEE INJECTION  . DG C-Arm 1-60 Min-No Report  . Informed Consent Details: Transcribe to consent form and obtain patient signature  . Provider attestation of informed consent for procedure/surgical case  . Verify informed consent  . Discharge instructions  . Follow-up   Imaging Ordered: Results for orders placed in visit on 01/12/17  DG C-Arm 1-60 Min-No Report   Narrative Fluoroscopy was utilized by the requesting physician.  No radiographic  interpretation.    New Prescriptions   No medications on file   Primary Care Physician: Karen Crouch, MD Location[facility] Note by: Karen Lewis. Karen Lewis, M.D, DABA, DABAPM, DABPM, DABIPP, FIPP Date: 02/09/2017; Time: 11:14 AM  Disclaimer:  Medicine is not an Chief Strategy Officer. The only guarantee in medicine is that  nothing is guaranteed. It is important to note that the decision to proceed with this intervention was based on the information collected from the patient. The Data and conclusions were drawn from the patient's questionnaire, the interview, and the physical examination. Because the information was provided in large part by the patient, it cannot be guaranteed that it has not been purposely or unconsciously manipulated. Every effort has been made to obtain as much  relevant data as possible for this evaluation. It is important to note that the conclusions that lead to this procedure are derived in large part from the available data. Always take into account that the treatment will also be dependent on availability of resources and existing treatment guidelines, considered by other Pain Management Practitioners as being common knowledge and practice, at the time of the intervention. For Medico-Legal purposes, it is also important to point out that variation in procedural techniques and pharmacological choices are the acceptable norm. The indications, contraindications, technique, and results of the above procedure should only be interpreted and judged by a Board-Certified Interventional Pain Specialist with extensive familiarity and expertise in the same exact procedure and technique.  Instructions provided at this appointment: Patient Instructions  ____________________________________________________________________________________________  Appointment Policy  It is our goal and responsibility to provide the medical community with assistance in the evaluation and management of patients with chronic pain. Unfortunately our resources are limited. Because we do not have an unlimited amount of time, or available appointments, we are required to closely monitor and manage their use. The following rules exist to maximize their use:  Patient's responsibilities: 1. Punctuality: You are required to be physically  present and registered in our facility at least 30 minutes before your appointment. 2. Tardiness: The cutoff is your appointment time. If you have an appointment scheduled for 10:00 AM and you arrive at 10:01, you will be required to reschedule your appointment.  3. Plan ahead: Always assume that you will encounter traffic on your way in. Plan for it. If you are dependent on a driver, make sure they understand these rules and the need to arrive early. 4. Other appointments and responsibilities: Avoid scheduling any other appointments before or after your pain clinic appointments.  5. Be prepared: Write down everything that you need to discuss with your healthcare provider and give this information to the admitting nurse. Write down the medications that you will need refilled. Bring your pills and bottles (even the empty ones), to all of your appointments, except for those where a procedure is scheduled. 6. No children or pets: Find someone to take care of them. It is not appropriate to bring them in. 7. Scheduling changes: We request "advanced notification" of any changes or cancellations. 8. Advanced notification: Defined as a time period of more than 24 hours prior to the originally scheduled appointment. This allows for the appointment to be offered to other patients. 9. Rescheduling: When a visit is rescheduled, it will require the cancellation of the original appointment. For this reason they both fall within the category of "Cancellations".  10. Cancellations: They require advanced notification. Any cancellation less than 24 hours before the  appointment will be recorded as a "No Show". 11. No Show: Defined as an unkept appointment where the patient failed to notify or declare to the practice their intention or inability to keep the appointment.  Corrective process for repeat offenders:  1. Tardiness: Three (3) episodes of rescheduling due to late arrivals will be recorded as one (1) "No  Show". 2. Cancellation or reschedule: Three (3) cancellations or rescheduling will be recorded as one (1) "No Show". 3. "No Shows": Three (3) "No Shows" within a 12 month period will result in discharge from the practice.  ____________________________________________________________________________________________  ____________________________________________________________________________________________  Post-Procedure instructions Instructions:  Apply ice: Fill a plastic sandwich bag with crushed ice. Cover it with a small towel and apply to injection site. Apply for 15 minutes then remove x 15  minutes. Repeat sequence on day of procedure, until you go to bed. The purpose is to minimize swelling and discomfort after procedure.  Apply heat: Apply heat to procedure site starting the day following the procedure. The purpose is to treat any soreness and discomfort from the procedure.  Food intake: Start with clear liquids (like water) and advance to regular food, as tolerated.   Physical activities: Keep activities to a minimum for the first 8 hours after the procedure.   Driving: If you have received any sedation, you are not allowed to drive for 24 hours after your procedure.  Blood thinner: Restart your blood thinner 6 hours after your procedure. (Only for those taking blood thinners)  Insulin: As soon as you can eat, you may resume your normal dosing schedule. (Only for those taking insulin)  Infection prevention: Keep procedure site clean and dry.  Post-procedure Pain Diary: Extremely important that this be done correctly and accurately. Recorded information will be used to determine the next step in treatment.  Pain evaluated is that of treated area only. Do not include pain from an untreated area.  Complete every hour, on the hour, for the initial 8 hours. Set an alarm to help you do this part accurately.  Do not go to sleep and have it completed later. It will not be  accurate.  Follow-up appointment: Keep your follow-up appointment after the procedure. Usually 2 weeks for most procedures. (6 weeks in the case of radiofrequency.) Bring you pain diary.  Expect:  From numbing medicine (AKA: Local Anesthetics): Numbness or decrease in pain.  Onset: Full effect within 15 minutes of injected.  Duration: It will depend on the type of local anesthetic used. On the average, 1 to 8 hours.   From steroids: Decrease in swelling or inflammation. Once inflammation is improved, relief of the pain will follow.  Onset of benefits: Depends on the amount of swelling present. The more swelling, the longer it will take for the benefits to be seen.   Duration: Steroids will stay in the system x 2 weeks. Duration of benefits will depend on multiple posibilities including persistent irritating factors.  From procedure: Some discomfort is to be expected once the numbing medicine wears off. This should be minimal if ice and heat are applied as instructed. Call if:  You experience numbness and weakness that gets worse with time, as opposed to wearing off.  New onset bowel or bladder incontinence. (Spinal procedures only)  Emergency Numbers:  Durning business hours (Monday - Thursday, 8:00 AM - 4:00 PM) (Friday, 9:00 AM - 12:00 Noon): [phone]  After hours: [phone] ____________________________________________________________________________________________  Pain Management Discharge Instructions  General Discharge Instructions :  If you need to reach your doctor call: Monday-Friday 8:00 am - 4:00 pm at [phone] or toll free [phone].  After clinic hours [phone] to have operator reach doctor.  Bring all of your medication bottles to all your appointments in the pain clinic.  To cancel or reschedule your appointment with Pain Management please remember to call 24 hours in advance to avoid a fee.  Refer to the educational materials which you  have been given on: General Risks, I had my Procedure. Discharge Instructions, Post Sedation.  Post Procedure Instructions:  The drugs you were given will stay in your system until tomorrow, so for the next 24 hours you should not drive, make any legal decisions or drink any alcoholic beverages.  You may eat anything you prefer, but it is better to  start with liquids then soups and crackers, and gradually work up to solid foods.  Please notify your doctor immediately if you have any unusual bleeding, trouble breathing or pain that is not related to your normal pain.  Depending on the type of procedure that was done, some parts of your body may feel week and/or numb.  This usually clears up by tonight or the next day.  Walk with the use of an assistive device or accompanied by an adult for the 24 hours.  You may use ice on the affected area for the first 24 hours.  Put ice in a Ziploc bag and cover with a towel and place against area 15 minutes on 15 minutes off.  You may switch to heat after 24 hours.

## 2017-02-09 NOTE — Progress Notes (Signed)
Safety precautions to be maintained throughout the outpatient stay will include: orient to surroundings, keep bed in low position, maintain call bell within reach at all times, provide assistance with transfer out of bed and ambulation.  

## 2017-02-10 ENCOUNTER — Telehealth: Payer: Self-pay

## 2017-02-10 NOTE — Telephone Encounter (Signed)
Post procedure phone call.  The number has been disconnected.

## 2017-02-10 NOTE — Telephone Encounter (Signed)
2nd attempt post procedure phone call.  States she is doing good.

## 2017-02-17 ENCOUNTER — Encounter: Payer: Self-pay | Admitting: Nurse Practitioner

## 2017-02-17 ENCOUNTER — Ambulatory Visit: Payer: Medicare HMO | Attending: Nurse Practitioner | Admitting: Nurse Practitioner

## 2017-02-17 VITALS — BP 139/85 | HR 99 | Temp 98.4°F | Resp 18 | Ht 60.0 in | Wt >= 6400 oz

## 2017-02-17 DIAGNOSIS — E282 Polycystic ovarian syndrome: Secondary | ICD-10-CM | POA: Diagnosis not present

## 2017-02-17 DIAGNOSIS — M792 Neuralgia and neuritis, unspecified: Secondary | ICD-10-CM

## 2017-02-17 DIAGNOSIS — Z886 Allergy status to analgesic agent status: Secondary | ICD-10-CM | POA: Insufficient documentation

## 2017-02-17 DIAGNOSIS — Z833 Family history of diabetes mellitus: Secondary | ICD-10-CM | POA: Diagnosis not present

## 2017-02-17 DIAGNOSIS — F329 Major depressive disorder, single episode, unspecified: Secondary | ICD-10-CM | POA: Diagnosis not present

## 2017-02-17 DIAGNOSIS — E785 Hyperlipidemia, unspecified: Secondary | ICD-10-CM | POA: Insufficient documentation

## 2017-02-17 DIAGNOSIS — K219 Gastro-esophageal reflux disease without esophagitis: Secondary | ICD-10-CM | POA: Insufficient documentation

## 2017-02-17 DIAGNOSIS — F419 Anxiety disorder, unspecified: Secondary | ICD-10-CM | POA: Insufficient documentation

## 2017-02-17 DIAGNOSIS — G894 Chronic pain syndrome: Secondary | ICD-10-CM

## 2017-02-17 DIAGNOSIS — Z8614 Personal history of Methicillin resistant Staphylococcus aureus infection: Secondary | ICD-10-CM | POA: Insufficient documentation

## 2017-02-17 DIAGNOSIS — Z79891 Long term (current) use of opiate analgesic: Secondary | ICD-10-CM | POA: Diagnosis not present

## 2017-02-17 DIAGNOSIS — M25561 Pain in right knee: Secondary | ICD-10-CM

## 2017-02-17 DIAGNOSIS — M797 Fibromyalgia: Secondary | ICD-10-CM | POA: Diagnosis not present

## 2017-02-17 DIAGNOSIS — E119 Type 2 diabetes mellitus without complications: Secondary | ICD-10-CM | POA: Insufficient documentation

## 2017-02-17 DIAGNOSIS — M109 Gout, unspecified: Secondary | ICD-10-CM | POA: Diagnosis not present

## 2017-02-17 DIAGNOSIS — Z811 Family history of alcohol abuse and dependence: Secondary | ICD-10-CM | POA: Diagnosis not present

## 2017-02-17 DIAGNOSIS — E039 Hypothyroidism, unspecified: Secondary | ICD-10-CM | POA: Diagnosis not present

## 2017-02-17 DIAGNOSIS — M170 Bilateral primary osteoarthritis of knee: Secondary | ICD-10-CM | POA: Insufficient documentation

## 2017-02-17 DIAGNOSIS — G8929 Other chronic pain: Secondary | ICD-10-CM | POA: Diagnosis not present

## 2017-02-17 DIAGNOSIS — Z9884 Bariatric surgery status: Secondary | ICD-10-CM | POA: Insufficient documentation

## 2017-02-17 DIAGNOSIS — Z818 Family history of other mental and behavioral disorders: Secondary | ICD-10-CM | POA: Insufficient documentation

## 2017-02-17 DIAGNOSIS — Z888 Allergy status to other drugs, medicaments and biological substances status: Secondary | ICD-10-CM | POA: Insufficient documentation

## 2017-02-17 DIAGNOSIS — Z79899 Other long term (current) drug therapy: Secondary | ICD-10-CM | POA: Insufficient documentation

## 2017-02-17 DIAGNOSIS — Z882 Allergy status to sulfonamides status: Secondary | ICD-10-CM | POA: Insufficient documentation

## 2017-02-17 DIAGNOSIS — Z881 Allergy status to other antibiotic agents status: Secondary | ICD-10-CM | POA: Insufficient documentation

## 2017-02-17 DIAGNOSIS — M25551 Pain in right hip: Secondary | ICD-10-CM

## 2017-02-17 DIAGNOSIS — Z8249 Family history of ischemic heart disease and other diseases of the circulatory system: Secondary | ICD-10-CM | POA: Diagnosis not present

## 2017-02-17 DIAGNOSIS — J45909 Unspecified asthma, uncomplicated: Secondary | ICD-10-CM | POA: Insufficient documentation

## 2017-02-17 DIAGNOSIS — Z5181 Encounter for therapeutic drug level monitoring: Secondary | ICD-10-CM | POA: Insufficient documentation

## 2017-02-17 DIAGNOSIS — E559 Vitamin D deficiency, unspecified: Secondary | ICD-10-CM | POA: Insufficient documentation

## 2017-02-17 DIAGNOSIS — Z841 Family history of disorders of kidney and ureter: Secondary | ICD-10-CM | POA: Diagnosis not present

## 2017-02-17 DIAGNOSIS — Z6841 Body Mass Index (BMI) 40.0 and over, adult: Secondary | ICD-10-CM | POA: Insufficient documentation

## 2017-02-17 DIAGNOSIS — Z96642 Presence of left artificial hip joint: Secondary | ICD-10-CM | POA: Insufficient documentation

## 2017-02-17 DIAGNOSIS — Z803 Family history of malignant neoplasm of breast: Secondary | ICD-10-CM | POA: Diagnosis not present

## 2017-02-17 DIAGNOSIS — M25562 Pain in left knee: Secondary | ICD-10-CM

## 2017-02-17 DIAGNOSIS — I10 Essential (primary) hypertension: Secondary | ICD-10-CM | POA: Insufficient documentation

## 2017-02-17 DIAGNOSIS — Z7984 Long term (current) use of oral hypoglycemic drugs: Secondary | ICD-10-CM | POA: Insufficient documentation

## 2017-02-17 MED ORDER — OXYCODONE HCL 5 MG PO TABS: 5.0000 mg | ORAL_TABLET | Freq: Every day | ORAL | 0 refills | Status: DC | PRN

## 2017-02-17 MED ORDER — PREGABALIN 25 MG PO CAPS: 25.0000 mg | ORAL_CAPSULE | Freq: Three times a day (TID) | ORAL | 0 refills | Status: DC

## 2017-02-17 MED ORDER — NALOXONE HCL 4 MG/0.1ML NA LIQD: NASAL | 0 refills | Status: DC

## 2017-02-17 NOTE — Progress Notes (Signed)
Nursing Pain Medication Assessment:  Safety precautions to be maintained throughout the outpatient stay will include: orient to surroundings, keep bed in low position, maintain call bell within reach at all times, provide assistance with transfer out of bed and ambulation.  Medication Inspection Compliance: Pill count conducted under aseptic conditions, in front of the patient. Neither the pills nor the bottle was removed from the patient's sight at any time. Once count was completed pills were immediately returned to the patient in their original bottle.  Medication: Oxycodone IR Pill/Patch Count: 121 of 150 pills remain Pill/Patch Appearance: Markings consistent with prescribed medication Bottle Appearance  05/ 29 / 2018 Last Medication intake:  Today  Patient states she has another prescription at the pharmacy to be filled.

## 2017-02-17 NOTE — Patient Instructions (Addendum)
____________________________________________________________________________________________  Medication Rules  Applies to: All patients receiving prescriptions (written or electronic).  Pharmacy of record: Pharmacy where electronic prescriptions will be sent. If written prescriptions are taken to a different pharmacy, please inform the nursing staff. The pharmacy listed in the electronic medical record should be the one where you would like electronic prescriptions to be sent.  Prescription refills: Only during scheduled appointments. Applies to both, written and electronic prescriptions.  NOTE: The following applies primarily to controlled substances (Opioid Pain Medications)  Patient's responsibilities: 1. Pain Pills: Bring all pain pills to every appointment (except for procedure appointments). 2. Pill Bottles: Bring pills in original pharmacy bottle. Always bring newest bottle. Bring bottle, even if empty. 3. Medication refills: You are responsible for knowing and keeping track of what medications you need refilled. The day before your appointment, write a list of all prescriptions that need to be refilled. Bring that list to your appointment and give it to the admitting nurse. Prescriptions will be written only during appointments. If you forget a medication, it will not be "Called in", "Faxed", or "electronically sent". You will need to get another appointment to get these prescribed. 4. Prescription Accuracy: You are responsible for carefully inspecting your prescriptions before leaving our office. Have the discharge nurse carefully go over each prescription with you, before taking them home. Make sure that your name is accurately spelled, that your address is correct. Check the name and dose of your medication to make sure it is accurate. Check the number of pills, and the written instructions to make sure they are clear and accurate. Make sure that you are given enough medication to last  until your next medication refill appointment. 5. Taking Medication: Take medication as prescribed. Never take more pills than instructed. Never take medication more frequently than prescribed. Taking less pills or less frequently is permitted and encouraged, when it comes to controlled substances (written prescriptions).  6. Inform other Doctors: Always inform, all of your healthcare providers, of all the medications you take. 7. Pain Medication from other Providers: You are not allowed to accept any additional pain medication from any other Doctor or Healthcare provider. There are two exceptions to this rule. (see below) In the event that you require additional pain medication, you are responsible for notifying us, as stated below. 8. Medication Agreement: You are responsible for carefully reading and following our Medication Agreement. This must be signed before receiving any prescriptions from our practice. Safely store a copy of your signed Agreement. Violations to the Agreement will result in no further prescriptions. (Additional copies of our Medication Agreement are available upon request.) 9. Laws, Rules, & Regulations: All patients are expected to follow all Federal and State Laws, Statutes, Rules, & Regulations. Ignorance of the Laws does not constitute a valid excuse.  Exceptions: There are only two exceptions to the rule of not receiving pain medications from other Healthcare Providers. 1. Exception #1 (Emergencies): In the event of an emergency (i.e.: accident requiring emergency care), you are allowed to receive additional pain medication. However, you are responsible for: As soon as you are able, call our office [phone], at any time of the day or night, and leave a message stating your name, the date and nature of the emergency, and the name and dose of the medication prescribed. In the event that your call is answered by a member of our staff, make sure to document and save the date,  time, and the name of the person that   took your information.  2. Exception #2 (Planned Surgery): In the event that you are scheduled by another doctor or dentist to have any type of surgery or procedure, you are allowed (for a period no longer than 30 days), to receive additional pain medication, for the acute post-op pain. However, in this case, you are responsible for picking up a copy of our "Post-op Pain Management for Surgeons" handout, and giving it to your surgeon or dentist. This document is available at our office, and does not require an appointment to obtain it. Simply go to our office during business hours (Monday-Thursday from 8:00 AM to 4:00 PM) (Friday 8:00 AM to 12:00 Noon) or if you have a scheduled appointment with us, prior to your surgery, and ask for it by name. In addition, you will need to provide us with your name, name of your surgeon, type of surgery, and date of procedure or surgery.  _____________________________________________________________________________________________   

## 2017-02-22 LAB — TOXASSURE SELECT 13 (MW), URINE

## 2017-02-28 ENCOUNTER — Telehealth: Payer: Self-pay | Admitting: *Deleted

## 2017-02-28 ENCOUNTER — Telehealth: Payer: Self-pay

## 2017-02-28 NOTE — Telephone Encounter (Signed)
Patient wants to know has nurses received a clinical review request from [place]? Please call patient

## 2017-02-28 NOTE — Telephone Encounter (Signed)
Patient notified that PA was sent for Lyrica, not Gabapentin as previously noted. Will call when response is received from [facility].

## 2017-02-28 NOTE — Telephone Encounter (Signed)
Informed patient per voicemail that Lyrica has been approved.

## 2017-02-28 NOTE — Telephone Encounter (Signed)
Sent PA for Gabapentin on 02-28-17. Contacted [employer], they said PA not required, patient should be able to fill it.

## 2017-03-02 DIAGNOSIS — R0602 Shortness of breath: Secondary | ICD-10-CM | POA: Diagnosis not present

## 2017-03-02 DIAGNOSIS — Z9884 Bariatric surgery status: Secondary | ICD-10-CM | POA: Diagnosis not present

## 2017-03-02 DIAGNOSIS — J4531 Mild persistent asthma with (acute) exacerbation: Secondary | ICD-10-CM | POA: Diagnosis not present

## 2017-03-02 DIAGNOSIS — E559 Vitamin D deficiency, unspecified: Secondary | ICD-10-CM | POA: Diagnosis not present

## 2017-03-02 DIAGNOSIS — E1165 Type 2 diabetes mellitus with hyperglycemia: Secondary | ICD-10-CM | POA: Diagnosis not present

## 2017-03-02 DIAGNOSIS — E782 Mixed hyperlipidemia: Secondary | ICD-10-CM | POA: Diagnosis not present

## 2017-03-02 DIAGNOSIS — E890 Postprocedural hypothyroidism: Secondary | ICD-10-CM | POA: Diagnosis not present

## 2017-03-02 DIAGNOSIS — E538 Deficiency of other specified B group vitamins: Secondary | ICD-10-CM | POA: Diagnosis not present

## 2017-03-02 DIAGNOSIS — E118 Type 2 diabetes mellitus with unspecified complications: Secondary | ICD-10-CM | POA: Diagnosis not present

## 2017-03-07 ENCOUNTER — Ambulatory Visit (INDEPENDENT_AMBULATORY_CARE_PROVIDER_SITE_OTHER): Payer: Medicare HMO

## 2017-03-07 VITALS — BP 151/69 | HR 106 | Ht 64.0 in | Wt >= 6400 oz

## 2017-03-07 DIAGNOSIS — N390 Urinary tract infection, site not specified: Secondary | ICD-10-CM | POA: Diagnosis not present

## 2017-03-07 LAB — URINALYSIS, COMPLETE
BILIRUBIN UA: NEGATIVE
Glucose, UA: NEGATIVE
KETONES UA: NEGATIVE
NITRITE UA: NEGATIVE
Protein, UA: NEGATIVE
SPEC GRAV UA: 1.015 (ref 1.005–1.030)
Urobilinogen, Ur: 0.2 mg/dL (ref 0.2–1.0)
pH, UA: 6.5 (ref 5.0–7.5)

## 2017-03-07 LAB — MICROSCOPIC EXAMINATION
Bacteria, UA: NONE SEEN
RBC MICROSCOPIC, UA: NONE SEEN /HPF (ref 0–?)

## 2017-03-07 NOTE — Progress Notes (Signed)
Patient present today for a cath UA. Patient complaining of Chills, back pain, bladder pressure, dysuria, and foul odor.  In and Out Catheterization  Patient is present today for a I & O catheterization due to possible UTI. Patient was cleaned and prepped in a sterile fashion with betadine and Lidocaine 2% jelly was instilled into the urethra.  A 14FR cath was inserted no complications were noted , 55ml of urine return was noted, urine was yellow in color. A clean urine sample was collected for UA and culture. Bladder was drained  And catheter was removed with out difficulty.    Preformed by: Fonnie Jarvis, CMA  Follow up/ Additional notes: Patient notified that urine was clear and we would send for culture and wait to treat based on culture results, will call

## 2017-03-09 DIAGNOSIS — M1611 Unilateral primary osteoarthritis, right hip: Secondary | ICD-10-CM | POA: Diagnosis not present

## 2017-03-09 DIAGNOSIS — M25551 Pain in right hip: Secondary | ICD-10-CM | POA: Diagnosis not present

## 2017-03-09 DIAGNOSIS — M1612 Unilateral primary osteoarthritis, left hip: Secondary | ICD-10-CM | POA: Diagnosis not present

## 2017-03-09 DIAGNOSIS — M25552 Pain in left hip: Secondary | ICD-10-CM | POA: Diagnosis not present

## 2017-03-09 DIAGNOSIS — M6281 Muscle weakness (generalized): Secondary | ICD-10-CM | POA: Diagnosis not present

## 2017-03-09 DIAGNOSIS — E669 Obesity, unspecified: Secondary | ICD-10-CM | POA: Diagnosis not present

## 2017-03-10 ENCOUNTER — Telehealth: Payer: Self-pay

## 2017-03-10 LAB — CULTURE, URINE COMPREHENSIVE

## 2017-03-10 NOTE — Telephone Encounter (Signed)
Pt or caregiver left vmail requesting ucx results.

## 2017-03-11 DIAGNOSIS — M25551 Pain in right hip: Secondary | ICD-10-CM | POA: Diagnosis not present

## 2017-03-11 DIAGNOSIS — E669 Obesity, unspecified: Secondary | ICD-10-CM | POA: Diagnosis not present

## 2017-03-11 DIAGNOSIS — M6281 Muscle weakness (generalized): Secondary | ICD-10-CM | POA: Diagnosis not present

## 2017-03-11 DIAGNOSIS — M1611 Unilateral primary osteoarthritis, right hip: Secondary | ICD-10-CM | POA: Diagnosis not present

## 2017-03-11 DIAGNOSIS — M1612 Unilateral primary osteoarthritis, left hip: Secondary | ICD-10-CM | POA: Diagnosis not present

## 2017-03-11 MED ORDER — NITROFURANTOIN MONOHYD MACRO 100 MG PO CAPS: 100.0000 mg | ORAL_CAPSULE | Freq: Two times a day (BID) | ORAL | 0 refills | Status: DC

## 2017-03-11 NOTE — Telephone Encounter (Signed)
Called pt. No answer. Sent Rx to pharm on file. No DPR. Will try later.

## 2017-03-11 NOTE — Telephone Encounter (Signed)
-----   Message from Nori Riis, PA-C sent at 03/11/2017  8:01 AM EDT ----- Patient has a +UCx.  They need to start Macrobid 100 mg, one capsule twice daily for seven days.  They also need to take a probiotic with the antibiotic course.

## 2017-03-14 DIAGNOSIS — M25551 Pain in right hip: Secondary | ICD-10-CM | POA: Diagnosis not present

## 2017-03-14 DIAGNOSIS — E669 Obesity, unspecified: Secondary | ICD-10-CM | POA: Diagnosis not present

## 2017-03-14 DIAGNOSIS — M6281 Muscle weakness (generalized): Secondary | ICD-10-CM | POA: Diagnosis not present

## 2017-03-14 DIAGNOSIS — M1611 Unilateral primary osteoarthritis, right hip: Secondary | ICD-10-CM | POA: Diagnosis not present

## 2017-03-14 DIAGNOSIS — M1612 Unilateral primary osteoarthritis, left hip: Secondary | ICD-10-CM | POA: Diagnosis not present

## 2017-03-14 NOTE — Telephone Encounter (Signed)
Spoke to patient. Gave results and instructions. Patient verbalized understanding.  

## 2017-03-17 DIAGNOSIS — E669 Obesity, unspecified: Secondary | ICD-10-CM | POA: Diagnosis not present

## 2017-03-17 DIAGNOSIS — M1612 Unilateral primary osteoarthritis, left hip: Secondary | ICD-10-CM | POA: Diagnosis not present

## 2017-03-17 DIAGNOSIS — M6281 Muscle weakness (generalized): Secondary | ICD-10-CM | POA: Diagnosis not present

## 2017-03-17 DIAGNOSIS — M1611 Unilateral primary osteoarthritis, right hip: Secondary | ICD-10-CM | POA: Diagnosis not present

## 2017-03-17 DIAGNOSIS — K006 Disturbances in tooth eruption: Secondary | ICD-10-CM | POA: Diagnosis not present

## 2017-03-17 DIAGNOSIS — M25551 Pain in right hip: Secondary | ICD-10-CM | POA: Diagnosis not present

## 2017-03-23 ENCOUNTER — Telehealth: Payer: Self-pay | Admitting: Pain Medicine

## 2017-03-23 NOTE — Telephone Encounter (Signed)
Patient was calling about script for Lyrica. Says PA told her if this med worked for her to call and a refill would be called in. She will be out the end of this week. Please call patient when she can pick up meds.

## 2017-03-23 NOTE — Telephone Encounter (Signed)
Medication, Lyrica, not listed under chart review, Meds but is under the 02/17/17 encounter notes.  She is using Product/process development scientist.

## 2017-03-24 ENCOUNTER — Other Ambulatory Visit: Payer: Self-pay | Admitting: Nurse Practitioner

## 2017-03-24 MED ORDER — PREGABALIN 50 MG PO CAPS: 50.0000 mg | ORAL_CAPSULE | Freq: Three times a day (TID) | ORAL | 1 refills | Status: DC

## 2017-03-24 NOTE — Telephone Encounter (Signed)
Lyrica 50 mg tid qty 90 refills x 2 (to get her out to her next med mgmt date) called to [facility]. 03/24/17@0940 

## 2017-03-24 NOTE — Telephone Encounter (Signed)
This is an increase in Lyrica, phoned patient back to let her to know to start with 50 mg at night and add an additional tablet every 3 days until she is up to tid. Patient verbalizes u/o information.

## 2017-03-25 ENCOUNTER — Other Ambulatory Visit: Payer: Self-pay | Admitting: Internal Medicine

## 2017-03-25 DIAGNOSIS — Z1231 Encounter for screening mammogram for malignant neoplasm of breast: Secondary | ICD-10-CM

## 2017-03-29 ENCOUNTER — Telehealth: Payer: Self-pay | Admitting: Pain Medicine

## 2017-03-29 ENCOUNTER — Telehealth: Payer: Self-pay

## 2017-03-29 NOTE — Telephone Encounter (Addendum)
Tiffany from [employer] called requesting further information, please call asap to get this approved [id number]  Also faxed over 2 sets of forms which were taken to the nurses desk. Kw

## 2017-03-29 NOTE — Telephone Encounter (Signed)
Pt needs a prior auth request completed for lyrica. A prior auth was done but for the wrong dosage. Krystal changed pt from 25 mg to 50.  Please call pt

## 2017-03-29 NOTE — Telephone Encounter (Signed)
PA completed 03/29/17, LP

## 2017-03-30 NOTE — Telephone Encounter (Signed)
Unable to get through on patient's number, recording states "all circuits are busy".

## 2017-03-30 NOTE — Telephone Encounter (Signed)
Have given to Crystal to try and get additional information in order to call the grievance dept.

## 2017-03-30 NOTE — Telephone Encounter (Signed)
Appeal for Gabapentin submitted via phone.

## 2017-03-30 NOTE — Telephone Encounter (Signed)
Correction, appeal sent for Lyrica

## 2017-04-04 ENCOUNTER — Telehealth: Payer: Self-pay | Admitting: Pain Medicine

## 2017-04-04 ENCOUNTER — Telehealth: Payer: Self-pay

## 2017-04-04 ENCOUNTER — Other Ambulatory Visit: Payer: Self-pay | Admitting: *Deleted

## 2017-04-04 DIAGNOSIS — I10 Essential (primary) hypertension: Secondary | ICD-10-CM | POA: Diagnosis not present

## 2017-04-04 DIAGNOSIS — E1142 Type 2 diabetes mellitus with diabetic polyneuropathy: Secondary | ICD-10-CM | POA: Diagnosis not present

## 2017-04-04 DIAGNOSIS — E785 Hyperlipidemia, unspecified: Secondary | ICD-10-CM | POA: Diagnosis not present

## 2017-04-04 DIAGNOSIS — E782 Mixed hyperlipidemia: Secondary | ICD-10-CM | POA: Diagnosis not present

## 2017-04-04 DIAGNOSIS — F329 Major depressive disorder, single episode, unspecified: Secondary | ICD-10-CM | POA: Diagnosis not present

## 2017-04-04 DIAGNOSIS — E282 Polycystic ovarian syndrome: Secondary | ICD-10-CM | POA: Diagnosis not present

## 2017-04-04 DIAGNOSIS — E042 Nontoxic multinodular goiter: Secondary | ICD-10-CM | POA: Diagnosis not present

## 2017-04-04 DIAGNOSIS — Z0000 Encounter for general adult medical examination without abnormal findings: Secondary | ICD-10-CM | POA: Diagnosis not present

## 2017-04-04 DIAGNOSIS — G4733 Obstructive sleep apnea (adult) (pediatric): Secondary | ICD-10-CM | POA: Diagnosis not present

## 2017-04-04 MED ORDER — PREGABALIN 25 MG PO CAPS: 25.0000 mg | ORAL_CAPSULE | Freq: Three times a day (TID) | ORAL | 1 refills | Status: DC

## 2017-04-04 MED ORDER — PREGABALIN 50 MG PO CAPS: 50.0000 mg | ORAL_CAPSULE | Freq: Three times a day (TID) | ORAL | 1 refills | Status: DC

## 2017-04-04 NOTE — Telephone Encounter (Signed)
Says insurance is not approving Lyrica 50mg  but they will appr 25mg , please call to discuss

## 2017-04-04 NOTE — Telephone Encounter (Signed)
Pt needs Karen Lewis to send another script to pharmacy because insurance will not pay for 50 mg. Insurance will only pay for 25 mg for right now.

## 2017-04-04 NOTE — Telephone Encounter (Signed)
Lyrica 25 mg tid qty 90 refill x 1 escribed to [facility]

## 2017-04-21 ENCOUNTER — Telehealth: Payer: Self-pay | Admitting: Pain Medicine

## 2017-04-21 ENCOUNTER — Other Ambulatory Visit: Payer: Self-pay | Admitting: Nurse Practitioner

## 2017-04-21 ENCOUNTER — Telehealth: Payer: Self-pay

## 2017-04-21 ENCOUNTER — Encounter: Payer: Self-pay | Admitting: Nurse Practitioner

## 2017-04-21 DIAGNOSIS — M79603 Pain in arm, unspecified: Secondary | ICD-10-CM | POA: Insufficient documentation

## 2017-04-21 DIAGNOSIS — M25512 Pain in left shoulder: Principal | ICD-10-CM

## 2017-04-21 DIAGNOSIS — G8929 Other chronic pain: Secondary | ICD-10-CM

## 2017-04-21 NOTE — Telephone Encounter (Signed)
Crystal can this patient have xrays. I will call and let her know if she can or not.

## 2017-04-21 NOTE — Telephone Encounter (Signed)
Called to inform pt that an order was put in for xray of her shoulder and that if less than 12 weeks her PCP could handle and she states that its been hurting for a while but for the last four weeks she could not pick up her arm. Pt with understanding

## 2017-04-21 NOTE — Telephone Encounter (Signed)
Having left shoulder pain started 4 weeks ago. She would like to get x rays for this before her next visit, she is sched with [facility] 05-18-17 or dr [name] 05-25-17 please call patient and let her know if she can go get xrays before her appts.

## 2017-04-22 ENCOUNTER — Ambulatory Visit
Admission: RE | Admit: 2017-04-22 | Discharge: 2017-04-22 | Disposition: A | Discharge status: Home / Self Care | Payer: Medicare HMO | Source: Ambulatory Visit | Attending: Nurse Practitioner | Admitting: Nurse Practitioner

## 2017-04-22 ENCOUNTER — Other Ambulatory Visit: Payer: Self-pay | Admitting: Internal Medicine

## 2017-04-22 ENCOUNTER — Ambulatory Visit
Admission: RE | Admit: 2017-04-22 | Discharge: 2017-04-22 | Disposition: A | Discharge status: Home / Self Care | Payer: Medicare HMO | Source: Ambulatory Visit | Attending: Internal Medicine | Admitting: Internal Medicine

## 2017-04-22 DIAGNOSIS — Z1231 Encounter for screening mammogram for malignant neoplasm of breast: Secondary | ICD-10-CM

## 2017-04-22 DIAGNOSIS — M25512 Pain in left shoulder: Secondary | ICD-10-CM | POA: Insufficient documentation

## 2017-04-22 DIAGNOSIS — M85812 Other specified disorders of bone density and structure, left shoulder: Secondary | ICD-10-CM | POA: Diagnosis not present

## 2017-04-22 DIAGNOSIS — M19012 Primary osteoarthritis, left shoulder: Secondary | ICD-10-CM | POA: Diagnosis not present

## 2017-04-22 DIAGNOSIS — G8929 Other chronic pain: Secondary | ICD-10-CM | POA: Diagnosis not present

## 2017-04-24 NOTE — Progress Notes (Signed)
04/25/2017 2:52 PM   Karen Lewis [birth year] [id number]  Referring provider: Idelle Crouch, MD [facility] [place], [place] [address]  Chief Complaint  Patient presents with  . Recurrent UTI    3 month follow up    HPI: Patient is a 46 -year-old Caucasian female who was placed on a three month trial of post-coital antibiotics.    Background history Patient was referred to [place] by, Dr. Doy Hutching, for recurrent urinary tract infections.  Patient states that she has had 7 or more urinary tract infections over the last year.  Reviewing her records,  she has had 6 documented positive urinary tract infections over the last year.  She feels like she has an infection today.   Her symptoms with a urinary tract infection consist of exhaustion, malodorous urine, cloudy urine, back pain, pelvic pain, urgency and dysuria.   She denies gross hematuria, suprapubic pain, back pain, abdominal pain or flank pain.   She has not had any recent fevers, chills, nausea or vomiting.    She does not have a history of nephrolithiasis, GU surgery or GU trauma.  She is sexually active.  She has noted a correlation with her urinary tract infections and sexual intercourse.  She does not engage in anal sex.  She is not voiding before and after sex.  She is not postmenopausal.  She admits to constipation and/or diarrhea.  She does engage in good perineal hygiene. She does not take tub baths.  She does not have incontinence.  She is not having pain with bladder filling.  She has not had any recent imaging studies.   She is drinking 2 bottles of water daily.  Diet sodas and diet teas.  No juices.  Has been on cranberry tablets, probiotics and Vitamin C.  She did not find them helpful.   Her PVR today is 12 mL.    At her visit three months ago, she was placed on Keflex to take after sexual activity and instructed to contact our office for breakthrough infections.  She did have an positive urine  culture for enterococcus in 02/2017.    Today, she has been experiencing urgency x 0-3, frequency x 4-7, not restricting fluids to avoid visits to the restroom, not engaging in toilet mapping, incontinence x 0-3 and nocturia x 0-3.   She endorses dysuria, cloudy urine and a foul odor to the urine.  This has been occurring for one week.   She denies suprapubic pain and gross hematuria. She denies any fevers, chills, nausea or vomiting.  She believes that this infection was the result of sexual activity.    PMH: Past Medical History:  Diagnosis Date  . Anemia   . Anginal pain ([place])   . Anxiety   . Arthralgia of hip 07/29/2015  . Arthritis   . Arthritis, degenerative 07/29/2015  . Asthma   . Cephalalgia 07/25/2014  . Dependence on unknown drug ([place])    multiplt controlled drug dependence  . Depression   . Diabetes mellitus without complication ([place])   . Dysrhythmia   . Eczema   . Fibromyalgia   . Gastritis   . GERD (gastroesophageal reflux disease)   . Gonalgia 07/29/2015   Overview:  Overview:  The patient has had bilateral intra-articular Hyalgan injections done on 07/16/2014 and although she seems to do well with this type of therapy, apparently her insurance company does not want to pay for they Hyalgan. On 11/27/2014 the patient  underwent a bilateral genicular nerve block with excellent results. On 01/28/2015 she had a right knee genicular radiofrequency ablatio  . Gout   . H/O cardiovascular disorder 03/10/2015  . H/O surgical procedure 12/05/2012   Overview:  LSG ([facility] - April 2013)    . H/O thyroid disease 03/10/2015  . Headache   . Herpes   . History of artificial joint 07/29/2015  . History of hiatal hernia   . Hypertension   . Hypomagnesemia   . Hypothyroidism   . LBP (low back pain) 07/29/2015  . Neuromuscular disorder ([place])   . Obesity   . PCOS (polycystic ovarian syndrome)   . Primary osteoarthritis of both knees 07/29/2015  . Sleep apnea   . Thyroid nodule  bilateral    Surgical History: Past Surgical History:  Procedure Laterality Date  . CARPAL TUNNEL RELEASE Bilateral   . CHOLECYSTECTOMY    . DIAGNOSTIC LAPAROSCOPY    . JOINT REPLACEMENT Left hip  . LAPAROSCOPIC PARTIAL GASTRECTOMY    . left trigger finger    . SHOULDER ARTHROSCOPY Right   . THYROIDECTOMY N/A 11/12/2015   Procedure: THYROIDECTOMY;  Surgeon: Clyde Canterbury, MD;  Location: [facility] ORS;  Service: ENT;  Laterality: N/A;  . TRIGGER FINGER RELEASE Right     Home Medications:  Allergies as of 04/25/2017      Reactions   Bactrim [sulfamethoxazole-trimethoprim] Hives   Xolair [omalizumab] Itching, Hives   Ciprofloxacin Other (See Comments)   Possible myalgias. myalgia Possible myalgias.   Aspirin Other (See Comments)   GI UPSET   Clindamycin Rash   Motrin [ibuprofen]    Other reaction(s): Other (See Comments) GI UPSET   Nsaids Nausea Only, Other (See Comments)   Stomach upset   Other Hives   Sulfa Antibiotics Hives      Medication List       Accurate as of 04/25/17  2:52 PM. Always use your most recent med list.          ACCU-CHEK AVIVA Soln   Acetaminophen 500 MG coapsule Take 500 mg by mouth every 6 (six) hours as needed.   acyclovir 800 MG tablet Commonly known as:  ZOVIRAX Take 800 mg by mouth 3 times/day as needed-between meals & bedtime. Reported on 11/12/2015   albuterol 108 (90 Base) MCG/ACT inhaler Commonly known as:  PROVENTIL HFA;VENTOLIN HFA Inhale 2 puffs into the lungs every 6 (six) hours as needed for wheezing or shortness of breath.   amitriptyline 100 MG tablet Commonly known as:  ELAVIL Take 100 mg by mouth at bedtime.   amitriptyline 100 MG tablet Commonly known as:  ELAVIL TAKE ONE TABLET BY MOUTH AT BEDTIME   azelastine 0.1 % nasal spray Commonly known as:  ASTELIN Place 1 spray into the nose 2 (two) times daily.   butalbital-acetaminophen-caffeine 50-325-40 MG tablet Commonly known as:  FIORICET, ESGIC 1 tablet every 6 (six)  hours as needed.   cephALEXin 250 MG capsule Commonly known as:  KEFLEX Take 1 capsule (250 mg total) by mouth as needed. Take one capsule after intercourse   Dulaglutide 1.5 MG/0.5ML Sopn Inject into the skin.   ferrous sulfate 325 (65 FE) MG tablet Take 325 mg by mouth daily with breakfast.   FIFTY50 GLUCOSE METER 2.0 w/Device Kit Use as directed. E11.0--ACCU CHEK AVIVA   ACCU-CHEK AVIVA PLUS w/Device Kit   FIFTY50 PEN NEEDLES 31G X 8 MM Misc Generic drug:  Insulin Pen Needle Use once daily.   fluticasone 0.05 % cream Commonly known  as:  CUTIVATE Apply topically.   gabapentin 800 MG tablet Commonly known as:  NEURONTIN Take 1 tablet (800 mg total) by mouth every 6 (six) hours.   glipiZIDE 10 MG tablet Commonly known as:  GLUCOTROL Take 5 mg by mouth 2 (two) times daily.   guaiFENesin-codeine 100-10 MG/5ML syrup Take by mouth.   ipratropium-albuterol 0.5-2.5 (3) MG/3ML Soln Commonly known as:  DUONEB 3 mLs as needed.   ketoconazole 2 % cream Commonly known as:  NIZORAL APPLY AS DIRECTED TO AFFECTED AREA TWICE DAILY AS NEEDED   levocetirizine 5 MG tablet Commonly known as:  XYZAL Take 5 mg by mouth every evening.   levothyroxine 100 MCG tablet Commonly known as:  SYNTHROID, LEVOTHROID Take on an empty stomach each morning before breakfast. Total dose 225 mcg daily.   levothyroxine 200 MCG tablet Commonly known as:  SYNTHROID, LEVOTHROID Take on an empty stomach each morning before breakfast. Total dose 250 mcg daily.   levothyroxine 50 MCG tablet Commonly known as:  SYNTHROID, LEVOTHROID Take 50 mcg by mouth daily before breakfast.   LOPRESSOR 50 MG tablet Generic drug:  metoprolol tartrate Take 75 mg by mouth 2 (two) times daily.   magnesium oxide 400 MG tablet Commonly known as:  MAG-OX Take 1 tablet (400 mg total) by mouth 2 (two) times daily.   meclizine 25 MG tablet Commonly known as:  ANTIVERT Take 1 tablet (25 mg total) by mouth 3 (three)  times daily as needed for dizziness.   medroxyPROGESTERone 150 MG/ML injection Commonly known as:  DEPO-PROVERA Inject 150 mg into the muscle every 3 (three) months.   metFORMIN 1000 MG tablet Commonly known as:  GLUCOPHAGE Take 1,000 mg by mouth 2 (two) times daily with a meal.   multivitamin with minerals Tabs tablet Take 2 tablets by mouth daily. Reported on 11/12/2015   naloxone 4 MG/0.1ML Liqd nasal spray kit Commonly known as:  NARCAN Spray into one nostril. Repeat with second device into other nostril after 2-3 minutes if no or minimal response.   NASACORT AQ 55 MCG/ACT Aero nasal inhaler Generic drug:  triamcinolone 2 sprays by Nasal route 2 (two) times daily.   nitrofurantoin (macrocrystal-monohydrate) 100 MG capsule Commonly known as:  MACROBID Take 1 capsule (100 mg total) by mouth every 12 (twelve) hours.   omeprazole 40 MG capsule Commonly known as:  PRILOSEC Take 40 mg by mouth 2 (two) times daily.   oxyCODONE 5 MG immediate release tablet Commonly known as:  Oxy IR/ROXICODONE Take 1 tablet (5 mg total) by mouth 5 (five) times daily as needed for severe pain.   oxyCODONE 5 MG immediate release tablet Commonly known as:  Oxy IR/ROXICODONE Take 1 tablet (5 mg total) by mouth 5 (five) times daily as needed for severe pain.   oxyCODONE 5 MG immediate release tablet Commonly known as:  Oxy IR/ROXICODONE Take 1 tablet (5 mg total) by mouth 5 (five) times daily as needed for severe pain. Start taking on:  05/03/2017   phentermine 15 MG capsule Take 37.5 mg by mouth.   pregabalin 25 MG capsule Commonly known as:  LYRICA Take 1 capsule (25 mg total) by mouth 3 (three) times daily.   QUEtiapine 100 MG tablet Commonly known as:  SEROQUEL Take 1 tablet (100 mg total) by mouth at bedtime.   ranitidine 150 MG capsule Commonly known as:  ZANTAC Take by mouth.   ranitidine 150 MG capsule Commonly known as:  ZANTAC Take 150 mg by mouth every evening.  rosuvastatin 40 MG tablet Commonly known as:  CRESTOR Take 40 mg by mouth daily.   sertraline 100 MG tablet Commonly known as:  ZOLOFT Take 2 tablets (200 mg total) by mouth daily.   SIMPLE DIAGNOSTICS LANCING DEV Misc Use 1 each as directed. Accu Chek Softclix. DX: E11.8   SINGULAIR 10 MG tablet Generic drug:  montelukast Take 10 mg by mouth at bedtime.   spironolactone 25 MG tablet Commonly known as:  ALDACTONE Take 37.5 mg by mouth 2 (two) times daily.   sucralfate 1 g tablet Commonly known as:  CARAFATE Take 2 g by mouth 2 (two) times daily.   tiZANidine 4 MG tablet Commonly known as:  ZANAFLEX 4 mg at bedtime.   triamcinolone 0.1 % paste Commonly known as:  KENALOG Apply 1 application topically 2 (two) times daily.   TRUE METRIX BLOOD GLUCOSE TEST test strip Generic drug:  glucose blood   ULTICARE ALCOHOL SWABS Pads Apply 1 each topically 2 (two) times daily. Use when checking BG. DX: E11.8   vitamin B-12 1000 MCG tablet Commonly known as:  CYANOCOBALAMIN Take by mouth.   Vitamin D (Ergocalciferol) 50000 units Caps capsule Commonly known as:  DRISDOL Take 1 capsule (50,000 Units total) by mouth 2 (two) times a week. X 6 weeks.   zonisamide 50 MG capsule Commonly known as:  ZONEGRAN Take 150 mg by mouth daily. 3 tabs at bedtime       Allergies:  Allergies  Allergen Reactions  . Bactrim [Sulfamethoxazole-Trimethoprim] Hives  . Xolair [Omalizumab] Itching and Hives  . Ciprofloxacin Other (See Comments)    Possible myalgias. myalgia Possible myalgias.  . Aspirin Other (See Comments)    GI UPSET  . Clindamycin Rash  . Motrin [Ibuprofen]     Other reaction(s): Other (See Comments) GI UPSET  . Nsaids Nausea Only and Other (See Comments)    Stomach upset  . Other Hives  . Sulfa Antibiotics Hives    Family History: Family History  Problem Relation Age of Onset  . Anxiety disorder Mother   . Depression Mother   . Alcohol abuse Mother   .  Diabetes Mother   . Hypertension Mother   . Kidney cancer Mother   . Sleep apnea Mother   . Alcohol abuse Father   . Anxiety disorder Father   . Depression Father   . Post-traumatic stress disorder Father   . Kidney failure Father   . COPD Father   . Diabetes Father   . Hypertension Father   . Sleep apnea Father   . Depression Brother   . Diabetes Brother   . Hypertension Brother   . Sleep apnea Brother   . Breast cancer Paternal Aunt   . Bladder Cancer Neg Hx   . Prostate cancer Neg Hx     Social History:  reports that she has never smoked. She has never used smokeless tobacco. She reports that she does not drink alcohol or use drugs.  ROS: UROLOGY Frequent Urination?: No Hard to postpone urination?: Yes Burning/pain with urination?: Yes Get up at night to urinate?: No Leakage of urine?: No Urine stream starts and stops?: No Trouble starting stream?: No Do you have to strain to urinate?: No Blood in urine?: No Urinary tract infection?: Yes Sexually transmitted disease?: No Injury to kidneys or bladder?: No Painful intercourse?: Yes Weak stream?: No Currently pregnant?: No Vaginal bleeding?: No Last menstrual period?: n  Gastrointestinal Nausea?: Yes Vomiting?: No Indigestion/heartburn?: No Diarrhea?: Yes Constipation?: No  Constitutional Fever: Yes Night sweats?: No Weight loss?: No Fatigue?: Yes  Skin Skin rash/lesions?: No Itching?: Yes  Eyes Blurred vision?: No Double vision?: No  Ears/Nose/Throat Sore throat?: No Sinus problems?: No  Hematologic/Lymphatic Swollen glands?: No Easy bruising?: No  Cardiovascular Leg swelling?: No Chest pain?: No  Respiratory Cough?: Yes Shortness of breath?: Yes  Endocrine Excessive thirst?: Yes  Musculoskeletal Back pain?: Yes Joint pain?: Yes  Neurological Headaches?: Yes Dizziness?: Yes  Psychologic Depression?: Yes Anxiety?: No  Physical Exam: BP 137/76   Pulse (!) 105   Temp  99.7 F (37.6 C) (Oral)   Ht _0  (1.626 m)   Wt (!) 423 lb 8 oz (192.1 kg)   BMI 72.69 kg/m   Constitutional: Well nourished. Alert and oriented, No acute distress. HEENT: [place] AT, moist mucus membranes. Trachea midline, no masses. Cardiovascular: No clubbing, cyanosis, or edema. Respiratory: Normal respiratory effort, no increased work of breathing. GI: Abdomen is soft, non tender, non distended, no abdominal masses. Liver and spleen not palpable.  No hernias appreciated.  Stool sample for occult testing is not indicated.   GU: No CVA tenderness.  No bladder fullness or masses.  Normal external genitalia, normal pubic hair distribution, no lesions.  Normal urethral meatus, no lesions, no prolapse, no discharge.   No urethral masses, tenderness and/or tenderness.  The rest of the exam could not be performed due to body habitus.   Anus and perineum are without rashes or lesions.    Skin: No rashes, bruises or suspicious lesions. Lymph: No cervical or inguinal adenopathy. Neurologic: Grossly intact, no focal deficits, moving all 4 extremities. Psychiatric: Normal mood and affect.  Laboratory Data:  Urinalysis 11-30 WBC's.  See EPIC   Assessment & Plan:    1. History of recurrent UTI's  - criteria for recurrent UTI has been met with 2 or more infections in 6 months or 3 or greater infections in one year   - reviewed UTI prevention strategies  - CATH UA's obtained for UA suspicious for infection  - post -coital antibiotic was given - Keflex - may have had a breakthrough infection - will send UA for culture - will have her take two Keflex 500 mg bid until culture is available  - RTC pending urine culture results  2. Dysuria  - see above                                   Return for pending urine culture.  These notes generated with voice recognition software. I apologize for typographical errors.  Zara Council, [facility] [address]  [place], [place] [address] [phone]

## 2017-04-25 ENCOUNTER — Encounter: Payer: Self-pay | Admitting: Urology

## 2017-04-25 ENCOUNTER — Ambulatory Visit (INDEPENDENT_AMBULATORY_CARE_PROVIDER_SITE_OTHER): Payer: Medicare HMO | Admitting: Urology

## 2017-04-25 VITALS — BP 137/76 | HR 105 | Temp 99.7°F | Ht 64.0 in | Wt >= 6400 oz

## 2017-04-25 DIAGNOSIS — Z87440 Personal history of urinary (tract) infections: Secondary | ICD-10-CM

## 2017-04-25 DIAGNOSIS — J0191 Acute recurrent sinusitis, unspecified: Secondary | ICD-10-CM | POA: Diagnosis not present

## 2017-04-25 DIAGNOSIS — R300 Dysuria: Secondary | ICD-10-CM

## 2017-04-25 DIAGNOSIS — H60501 Unspecified acute noninfective otitis externa, right ear: Secondary | ICD-10-CM | POA: Diagnosis not present

## 2017-04-25 LAB — URINALYSIS, COMPLETE
Bilirubin, UA: NEGATIVE
GLUCOSE, UA: NEGATIVE
KETONES UA: NEGATIVE
NITRITE UA: NEGATIVE
RBC UA: NEGATIVE
SPEC GRAV UA: 1.02 (ref 1.005–1.030)
UUROB: 0.2 mg/dL (ref 0.2–1.0)
pH, UA: 7 (ref 5.0–7.5)

## 2017-04-25 LAB — MICROSCOPIC EXAMINATION: RBC MICROSCOPIC, UA: NONE SEEN /HPF (ref 0–?)

## 2017-04-25 NOTE — Progress Notes (Signed)
In and Out Catheterization  Patient is present today for a I & O catheterization due to recurrent uti. Patient was cleaned and prepped in a sterile fashion with betadine and Lidocaine 2% jelly was instilled into the urethra.  A 14FR cath was inserted no complications were noted , 115ml of urine return was noted, urine was yellow in color. A clean urine sample was collected for urinalysis and culture. Bladder was drained and catheter was removed with out difficulty.    Preformed by: Zara Council PA-C, Lucy PA-S, Lyndee Hensen CMA

## 2017-04-27 LAB — CULTURE, URINE COMPREHENSIVE

## 2017-04-28 ENCOUNTER — Telehealth: Payer: Self-pay

## 2017-04-28 DIAGNOSIS — N390 Urinary tract infection, site not specified: Secondary | ICD-10-CM

## 2017-04-28 MED ORDER — NITROFURANTOIN MONOHYD MACRO 100 MG PO CAPS: 100.0000 mg | ORAL_CAPSULE | Freq: Two times a day (BID) | ORAL | 0 refills | Status: DC

## 2017-04-28 NOTE — Telephone Encounter (Signed)
Spoke with pt in reference to +ucx. Made aware abx were sent to pharmacy. Pt voiced understanding.  

## 2017-04-28 NOTE — Telephone Encounter (Signed)
-----   Message from Nori Riis, PA-C sent at 04/28/2017  8:23 AM EDT ----- Please let Mrs. Woodbeck know that her urine culture is positive.  We need to start Macrobid 100 mg , one capsule bid x 14.

## 2017-04-28 NOTE — Progress Notes (Signed)
Results were reviewed and found to be: mildly abnormal  Review would suggest interventional pain management techniques may be of benefit

## 2017-05-02 ENCOUNTER — Telehealth: Payer: Self-pay

## 2017-05-02 DIAGNOSIS — N390 Urinary tract infection, site not specified: Secondary | ICD-10-CM

## 2017-05-02 MED ORDER — CEPHALEXIN 500 MG PO CAPS: 500.0000 mg | ORAL_CAPSULE | Freq: Two times a day (BID) | ORAL | 0 refills | Status: DC

## 2017-05-02 NOTE — Telephone Encounter (Signed)
Pt requested Dr. Doy Hutching nurse call and see about pt taking 1 abx to cover ear infection and UTI. Spoke with Dr. Doy Hutching nurse and was not able to come up with an abx that will cover both infections.  Spoke with pt and made aware can either have keflex or finish PA for macrobid. Pt elected to have keflex. Abx sent to pharmacy.

## 2017-05-02 NOTE — Telephone Encounter (Signed)
Pt calling office stating that she has more information to share with the nurse .... Pt called [employer] back, they faxed another form. Pt states to call [employer] instead of faxing them @ [phone] pt ID # is [id number] -- Pt would like to take only one antibiotic if at all possible. Please advise. Thanks.

## 2017-05-02 NOTE — Telephone Encounter (Signed)
Pt called stating the macrobid that was called in is going to cost her $60. Pt stated she is not able to afford that right now and request another medication. Please advise.

## 2017-05-02 NOTE — Telephone Encounter (Signed)
We can call in Keflex 500 mg bid, #14.

## 2017-05-04 DIAGNOSIS — K219 Gastro-esophageal reflux disease without esophagitis: Secondary | ICD-10-CM | POA: Diagnosis not present

## 2017-05-04 DIAGNOSIS — G8929 Other chronic pain: Secondary | ICD-10-CM | POA: Diagnosis not present

## 2017-05-04 DIAGNOSIS — M545 Low back pain: Secondary | ICD-10-CM | POA: Diagnosis not present

## 2017-05-13 DIAGNOSIS — G8929 Other chronic pain: Secondary | ICD-10-CM | POA: Diagnosis not present

## 2017-05-13 DIAGNOSIS — K219 Gastro-esophageal reflux disease without esophagitis: Secondary | ICD-10-CM | POA: Diagnosis not present

## 2017-05-13 DIAGNOSIS — M545 Low back pain: Secondary | ICD-10-CM | POA: Diagnosis not present

## 2017-05-13 DIAGNOSIS — Z01818 Encounter for other preprocedural examination: Secondary | ICD-10-CM | POA: Diagnosis not present

## 2017-05-17 ENCOUNTER — Telehealth: Payer: Self-pay | Admitting: Urology

## 2017-05-17 DIAGNOSIS — G8929 Other chronic pain: Secondary | ICD-10-CM | POA: Diagnosis not present

## 2017-05-17 DIAGNOSIS — Z452 Encounter for adjustment and management of vascular access device: Secondary | ICD-10-CM | POA: Diagnosis not present

## 2017-05-17 DIAGNOSIS — M545 Low back pain: Secondary | ICD-10-CM | POA: Diagnosis not present

## 2017-05-17 DIAGNOSIS — Z01818 Encounter for other preprocedural examination: Secondary | ICD-10-CM | POA: Diagnosis not present

## 2017-05-17 DIAGNOSIS — K219 Gastro-esophageal reflux disease without esophagitis: Secondary | ICD-10-CM | POA: Diagnosis not present

## 2017-05-17 NOTE — Telephone Encounter (Signed)
Patient called the office today.  She has completed her antibiotics for UTI, but her symptoms still persist.   She is experiencing low grade fever, cloudy and foul smelling urine.  She is requesting to come to the office for a CATH specimen so I added her to the nurse schedule for tomorrow, 9/5 at 1:15pm.

## 2017-05-18 ENCOUNTER — Ambulatory Visit: Payer: Medicare HMO | Attending: Nurse Practitioner | Admitting: Nurse Practitioner

## 2017-05-18 ENCOUNTER — Encounter: Payer: Self-pay | Admitting: Nurse Practitioner

## 2017-05-18 ENCOUNTER — Ambulatory Visit (INDEPENDENT_AMBULATORY_CARE_PROVIDER_SITE_OTHER): Payer: Medicare HMO

## 2017-05-18 VITALS — BP 120/75 | HR 91 | Ht 64.0 in | Wt >= 6400 oz

## 2017-05-18 VITALS — BP 123/79 | HR 107 | Temp 98.2°F | Resp 16 | Ht 64.0 in | Wt >= 6400 oz

## 2017-05-18 DIAGNOSIS — Z818 Family history of other mental and behavioral disorders: Secondary | ICD-10-CM | POA: Insufficient documentation

## 2017-05-18 DIAGNOSIS — J45909 Unspecified asthma, uncomplicated: Secondary | ICD-10-CM | POA: Diagnosis not present

## 2017-05-18 DIAGNOSIS — M170 Bilateral primary osteoarthritis of knee: Secondary | ICD-10-CM | POA: Insufficient documentation

## 2017-05-18 DIAGNOSIS — M25551 Pain in right hip: Secondary | ICD-10-CM | POA: Insufficient documentation

## 2017-05-18 DIAGNOSIS — G894 Chronic pain syndrome: Secondary | ICD-10-CM | POA: Insufficient documentation

## 2017-05-18 DIAGNOSIS — M109 Gout, unspecified: Secondary | ICD-10-CM | POA: Diagnosis not present

## 2017-05-18 DIAGNOSIS — E282 Polycystic ovarian syndrome: Secondary | ICD-10-CM | POA: Diagnosis not present

## 2017-05-18 DIAGNOSIS — N390 Urinary tract infection, site not specified: Secondary | ICD-10-CM | POA: Diagnosis not present

## 2017-05-18 DIAGNOSIS — I25119 Atherosclerotic heart disease of native coronary artery with unspecified angina pectoris: Secondary | ICD-10-CM | POA: Diagnosis not present

## 2017-05-18 DIAGNOSIS — M25512 Pain in left shoulder: Secondary | ICD-10-CM | POA: Diagnosis not present

## 2017-05-18 DIAGNOSIS — Z882 Allergy status to sulfonamides status: Secondary | ICD-10-CM | POA: Insufficient documentation

## 2017-05-18 DIAGNOSIS — M479 Spondylosis, unspecified: Secondary | ICD-10-CM | POA: Insufficient documentation

## 2017-05-18 DIAGNOSIS — F419 Anxiety disorder, unspecified: Secondary | ICD-10-CM | POA: Insufficient documentation

## 2017-05-18 DIAGNOSIS — Z79899 Other long term (current) drug therapy: Secondary | ICD-10-CM | POA: Diagnosis not present

## 2017-05-18 DIAGNOSIS — Z96642 Presence of left artificial hip joint: Secondary | ICD-10-CM | POA: Diagnosis not present

## 2017-05-18 DIAGNOSIS — Z833 Family history of diabetes mellitus: Secondary | ICD-10-CM | POA: Insufficient documentation

## 2017-05-18 DIAGNOSIS — Z9884 Bariatric surgery status: Secondary | ICD-10-CM | POA: Diagnosis not present

## 2017-05-18 DIAGNOSIS — M797 Fibromyalgia: Secondary | ICD-10-CM | POA: Insufficient documentation

## 2017-05-18 DIAGNOSIS — M792 Neuralgia and neuritis, unspecified: Secondary | ICD-10-CM | POA: Diagnosis not present

## 2017-05-18 DIAGNOSIS — Z825 Family history of asthma and other chronic lower respiratory diseases: Secondary | ICD-10-CM | POA: Insufficient documentation

## 2017-05-18 DIAGNOSIS — R1310 Dysphagia, unspecified: Secondary | ICD-10-CM | POA: Insufficient documentation

## 2017-05-18 DIAGNOSIS — Z886 Allergy status to analgesic agent status: Secondary | ICD-10-CM | POA: Insufficient documentation

## 2017-05-18 DIAGNOSIS — I10 Essential (primary) hypertension: Secondary | ICD-10-CM | POA: Insufficient documentation

## 2017-05-18 DIAGNOSIS — E785 Hyperlipidemia, unspecified: Secondary | ICD-10-CM | POA: Diagnosis not present

## 2017-05-18 DIAGNOSIS — Z8614 Personal history of Methicillin resistant Staphylococcus aureus infection: Secondary | ICD-10-CM | POA: Diagnosis not present

## 2017-05-18 DIAGNOSIS — M542 Cervicalgia: Secondary | ICD-10-CM | POA: Insufficient documentation

## 2017-05-18 DIAGNOSIS — Z8051 Family history of malignant neoplasm of kidney: Secondary | ICD-10-CM | POA: Insufficient documentation

## 2017-05-18 DIAGNOSIS — E042 Nontoxic multinodular goiter: Secondary | ICD-10-CM | POA: Diagnosis not present

## 2017-05-18 DIAGNOSIS — M47816 Spondylosis without myelopathy or radiculopathy, lumbar region: Secondary | ICD-10-CM

## 2017-05-18 DIAGNOSIS — M4696 Unspecified inflammatory spondylopathy, lumbar region: Secondary | ICD-10-CM | POA: Diagnosis not present

## 2017-05-18 DIAGNOSIS — K449 Diaphragmatic hernia without obstruction or gangrene: Secondary | ICD-10-CM | POA: Insufficient documentation

## 2017-05-18 DIAGNOSIS — K219 Gastro-esophageal reflux disease without esophagitis: Secondary | ICD-10-CM | POA: Insufficient documentation

## 2017-05-18 DIAGNOSIS — Z881 Allergy status to other antibiotic agents status: Secondary | ICD-10-CM | POA: Insufficient documentation

## 2017-05-18 DIAGNOSIS — Z803 Family history of malignant neoplasm of breast: Secondary | ICD-10-CM | POA: Insufficient documentation

## 2017-05-18 DIAGNOSIS — G8929 Other chronic pain: Secondary | ICD-10-CM | POA: Diagnosis not present

## 2017-05-18 DIAGNOSIS — Z794 Long term (current) use of insulin: Secondary | ICD-10-CM | POA: Insufficient documentation

## 2017-05-18 DIAGNOSIS — D649 Anemia, unspecified: Secondary | ICD-10-CM | POA: Insufficient documentation

## 2017-05-18 DIAGNOSIS — L309 Dermatitis, unspecified: Secondary | ICD-10-CM | POA: Diagnosis not present

## 2017-05-18 DIAGNOSIS — E039 Hypothyroidism, unspecified: Secondary | ICD-10-CM | POA: Insufficient documentation

## 2017-05-18 DIAGNOSIS — Z9889 Other specified postprocedural states: Secondary | ICD-10-CM | POA: Insufficient documentation

## 2017-05-18 DIAGNOSIS — K2970 Gastritis, unspecified, without bleeding: Secondary | ICD-10-CM | POA: Insufficient documentation

## 2017-05-18 DIAGNOSIS — Z9049 Acquired absence of other specified parts of digestive tract: Secondary | ICD-10-CM | POA: Insufficient documentation

## 2017-05-18 DIAGNOSIS — Z811 Family history of alcohol abuse and dependence: Secondary | ICD-10-CM | POA: Insufficient documentation

## 2017-05-18 LAB — URINALYSIS, COMPLETE
BILIRUBIN UA: NEGATIVE
Glucose, UA: NEGATIVE
KETONES UA: NEGATIVE
NITRITE UA: POSITIVE — AB
Protein, UA: NEGATIVE
RBC UA: NEGATIVE
SPEC GRAV UA: 1.015 (ref 1.005–1.030)
Urobilinogen, Ur: 0.2 mg/dL (ref 0.2–1.0)
pH, UA: 7.5 (ref 5.0–7.5)

## 2017-05-18 LAB — MICROSCOPIC EXAMINATION: RBC MICROSCOPIC, UA: NONE SEEN /HPF (ref 0–?)

## 2017-05-18 MED ORDER — PREGABALIN 100 MG PO CAPS: 100.0000 mg | ORAL_CAPSULE | Freq: Three times a day (TID) | ORAL | 2 refills | Status: DC

## 2017-05-18 MED ORDER — MAGNESIUM OXIDE 400 MG PO TABS: 400.0000 mg | ORAL_TABLET | Freq: Two times a day (BID) | ORAL | 2 refills | Status: DC

## 2017-05-18 MED ORDER — PREGABALIN 100 MG PO CAPS: 100.0000 mg | ORAL_CAPSULE | Freq: Three times a day (TID) | ORAL | 0 refills | Status: DC

## 2017-05-18 MED ORDER — GABAPENTIN 800 MG PO TABS: 800.0000 mg | ORAL_TABLET | Freq: Four times a day (QID) | ORAL | 0 refills | Status: DC

## 2017-05-18 NOTE — Progress Notes (Addendum)
Patient's Name: Karen Lewis  MRN: [id number]  Referring Provider: Idelle Crouch, MD  DOB: 1970-11-14  PCP: Karen Crouch, MD  DOS: 05/18/2017  Note by: Karen Francois NP  Service setting: Ambulatory outpatient  Specialty: Interventional Pain Management  Location: [facility] (AMB) Pain Management Facility    Patient type: Established    Primary Reason(s) for Visit: Encounter for prescription drug management. (Level of risk: moderate)  CC: Hip Pain (right); Back Pain (lower right mid to lower); Knee Pain (bilateral); and Neck Pain (s/p surgical procedure. )  HPI  Karen Lewis is a 46 y.o. year old, female patient, who comes today for a medication management evaluation. She has History of asthma; Binge eating disorder; Fibromyalgia; Anxiety, generalized; Insomnia, persistent; Depression, major, recurrent, moderate ([place]); Uncomplicated asthma; Eczema; Gout; Hyperlipidemia; Headache, migraine; Obstructive apnea; Bilateral polycystic ovarian syndrome; Apnea, sleep; Disease of thyroid gland; Avitaminosis D; History of surgical procedure; Type 2 diabetes mellitus ([place]); Long term current use of opiate analgesic; Long term prescription opiate use; Opiate use; Encounter for therapeutic drug level monitoring; Opiate dependence ([place]); Essential (primary) hypertension; Goiter, nontoxic, multinodular; Chronic knee pain (Location of Primary Source of Pain) (Bilateral) (R>L); Chronic low back pain (Location of Tertiary source of pain) (Bilateral) (R>L); Lumbar facet syndrome (Bilateral) (R>L); Osteoarthritis; Grade 1 (1.4 cm) Anterolisthesis of L4 over L5; Chronic hip pain (Location of Secondary source of pain) (Right); S/P THR (total hip replacement) (Left); History of methicillin resistant staphylococcus aureus (MRSA); History of bariatric surgery; Hypomagnesemia; Morbid obesity with BMI of 70 and over, adult ([place]) (71.75 on 08/27/2015); History of methicillin resistant Staphylococcus aureus infection; Lumbar spondylosis;  Vitamin D insufficiency; Dysphagia, unspecified; Gastroesophageal reflux disease without esophagitis; Osteoarthritis of hip (Right); Chest pain with low risk of acute coronary syndrome; Nontoxic goiter; Other psychoactive substance dependence, uncomplicated (HCC); SOB (shortness of breath) on exertion; Chronic pain syndrome; Osteoarthritis of knee (Bilateral) (R>L); Neurogenic pain; Upper extremity pain; and Chronic left shoulder pain on her problem list. Her primarily concern today is the Hip Pain (right); Back Pain (lower right mid to lower); Knee Pain (bilateral); and Neck Pain (s/p surgical procedure. )  Pain Assessment: Location: Right (bilateral on back and knees) Hip (back, knee and neck) Radiating: down the right leg Onset: More than a month ago Duration: Chronic pain Quality: Radiating, Aching, Throbbing, Spasm, Constant Severity: 7 /10 (self-reported pain score)  Note: Reported level is compatible with observation.                   Effect on ADL: movement makes it worse Timing: Constant Modifying factors: pain medicines helps a little bit, heat, massage, repositioning  Karen Lewis was last scheduled for an appointment on 02/17/2017 for medication management. During today's appointment we reviewed Karen Lewis's chronic pain status, as well as her outpatient medication regimen. She admits that she is getting ready to undergo gastric bypass for the second time in 10 days.   The patient  reports that she does not use drugs. Her body mass index is 70.72 kg/m.  Further details on both, my assessment(s), as well as the proposed treatment plan, please see below.  Controlled Substance Pharmacotherapy Assessment REMS (Risk Evaluation and Mitigation Strategy)  Analgesic:Oxycodone/APAP 5/325 one every 6 hours when necessary for pain (20 mg/day) MME/day:30 mg/day. Karen Billow, Karen Lewis  05/18/2017 11:51 AM  Sign at close encounter Nursing Pain Medication Assessment:  Safety precautions to be  maintained throughout the outpatient stay will include: orient to surroundings, keep bed  in low position, maintain call bell within reach at all times, provide assistance with transfer out of bed and ambulation.  Medication Inspection Compliance: Pill count conducted under aseptic conditions, in front of the patient. Neither the pills nor the bottle was removed from the patient's sight at any time. Once count was completed pills were immediately returned to the patient in their original bottle.  Medication: Oxycodone IR Pill/Patch Count: 37 of 150 pills remain Pill/Patch Appearance: Markings consistent with prescribed medication Bottle Appearance: Standard pharmacy container. Clearly labeled. Filled Date: 07 / 16 / 2018 Last Medication intake:  Today   Pharmacokinetics: Liberation and absorption (onset of action): WNL Distribution (time to peak effect): WNL Metabolism and excretion (duration of action): WNL         Pharmacodynamics: Desired effects: Analgesia: [name] reports >50% benefit. Functional ability: Patient reports that medication allows her to accomplish basic ADLs Clinically meaningful improvement in function (CMIF): Sustained CMIF goals met Perceived effectiveness: Described as relatively effective, allowing for increase in activities of daily living (ADL) Undesirable effects: Side-effects or Adverse reactions: None reported Monitoring: [place] PMP: Online review of the past 31-monthperiod conducted. Compliant with practice rules and regulations List of all UDS test(s) done:  Lab Results  Component Value Date   TOXASSSELUR FINAL 02/23/2016   TOXASSSELUR FINAL 10/02/2015   TOXASSSELUR FINAL 08/27/2015   [place] 07/29/2015   SUMMARY FINAL 02/17/2017   Last UDS on record: [employer] Select 13  Date Value Ref Range Status  02/23/2016 FINAL  Final    Comment:    ==================================================================== TOXASSURE SELECT 13  (MW) ==================================================================== Test                             Result       Flag       Units Drug Present and Declared for Prescription Verification   Amphetamine                    >3676        EXPECTED   ng/mg creat    Amphetamine is available as a schedule II prescription drug.   Oxycodone                      401          EXPECTED   ng/mg creat   Oxymorphone                    31           EXPECTED   ng/mg creat   Noroxycodone                   2608         EXPECTED   ng/mg creat    Sources of oxycodone include scheduled prescription medications.    Oxymorphone and noroxycodone are expected metabolites of    oxycodone. Oxymorphone is also available as a scheduled    prescription medication.   Butalbital                     PRESENT      EXPECTED Drug Present not Declared for Prescription Verification   Alcohol, Ethyl                 0.026        UNEXPECTED g/dL    Sources of ethyl alcohol include alcoholic beverages or  as a    fermentation product of glucose; glucose was not detected in this    specimen. Ethyl alcohol result should be interpreted in the    context of all available clinical and behavioral information. Drug Absent but Declared for Prescription Verification   Clonazepam                     Not Detected UNEXPECTED ng/mg creat ==================================================================== Test                      Result    Flag   Units      Ref Range   Creatinine              272              mg/dL      >=20 ==================================================================== Declared Medications:  The flagging and interpretation on this report are based on the  following declared medications.  Unexpected results may arise from  inaccuracies in the declared medications.  **Note: The testing scope of this panel includes these medications:  Amphetamine (Vyvanse)  Butalbital (Esgic)  Butalbital (Fioricet)  Clonazepam  (Klonopin)  Oxycodone (Percocet)  Oxycodone (Roxicet)  **Note: The testing scope of this panel does not include following  reported medications:  Acetaminophen (Esgic)  Acetaminophen (Fioricet)  Acetaminophen (Percocet)  Acetaminophen (Roxicet)  Acyclovir  Albuterol  Albuterol (Duoneb)  Amitriptyline (Elavil)  Atorvastatin (Lipitor)  Epinephrine  Fluconazole (Diflucan)  Fluticasone (Advair)  Gabapentin  Ipratropium (Duoneb)  Ketoconazole  Levocetirizine (Xyzal)  Levothyroxine  Magnesium (Mag-Ox)  Meclizine  Medroxyprogesterone (Depo-Provera)  Metoprolol (Lopressor)  Montelukast (Singulair)  Multivitamin  Nystatin  Omeprazole  Quetiapine (Seroquel)  Salmeterol (Advair)  Sertraline (Zoloft)  Spironolactone (Aldactone)  Supplement (OsCal)  Tiotropium (Spiriva)  Tizanidine (Zanaflex)  Triamcinolone (Nasacort)  Vitamin D2 (Ergocalciferol)  Vitamin D3  Zolpidem (Ambien)  Zonisamide ==================================================================== For clinical consultation, please call [phone]. ====================================================================    Summary  Date Value Ref Range Status  02/17/2017 FINAL  Final    Comment:    ==================================================================== TOXASSURE SELECT 13 (MW) ==================================================================== Test                             Result       Flag       Units Drug Present and Declared for Prescription Verification   Oxycodone                      135          EXPECTED   ng/mg creat   Noroxycodone                   1816         EXPECTED   ng/mg creat    Sources of oxycodone include scheduled prescription medications.    Noroxycodone is an expected metabolite of oxycodone. Drug Absent but Declared for Prescription Verification   Codeine                        Not Detected UNEXPECTED ng/mg creat   Butalbital                     Not Detected  UNEXPECTED ==================================================================== Test                      Result    Flag  Units      Ref Range   Creatinine              167              mg/dL      >=20 ==================================================================== Declared Medications:  The flagging and interpretation on this report are based on the  following declared medications.  Unexpected results may arise from  inaccuracies in the declared medications.  **Note: The testing scope of this panel includes these medications:  Butalbital (Butalbital/APAP/Caffeine)  Codeine  Oxycodone  **Note: The testing scope of this panel does not include following  reported medications:  Acetaminophen  Acetaminophen (Butalbital/APAP/Caffeine)  Acyclovir  Albuterol  Albuterol (Ipratropium-Albuterol)  Amitriptyline  Azelastine  Caffeine (Butalbital/APAP/Caffeine)  Cephalexin  Cyanocobalamin  Gabapentin  Glipizide  Guaifenesin  Ipratropium (Ipratropium-Albuterol)  Ketoconazole  Levocetirizine  Levothyroxine  Magnesium Oxide  Meclizine  Medroxyprogesterone  Metformin  Metoprolol  Montelukast  Multivitamin  Omeprazole  Phentermine  Quetiapine  Ranitidine  Rosuvastatin  Sertraline  Spironolactone  Sucralfate  Tizanidine  Triamcinolone acetonide  Vitamin D2 (Ergocalciferol)  Zonisamide ==================================================================== For clinical consultation, please call [phone]. ====================================================================    UDS interpretation: Compliant          Medication Assessment Form: Reviewed. Patient indicates being compliant with therapy Treatment compliance: Compliant Risk Assessment Profile: Aberrant behavior: See prior evaluations. None observed or detected today Comorbid factors increasing risk of overdose: See prior notes. No additional risks detected today Risk of substance use disorder (SUD): Low      Opioid Risk Tool - 05/18/17 1148      Family History of Substance Abuse   Alcohol Positive Female   Illegal Drugs Positive Female   Rx Drugs Negative     Personal History of Substance Abuse   Alcohol Negative   Illegal Drugs Negative   Rx Drugs Negative     Psychological Disease   Psychological Disease Positive   ADD Negative   OCD Negative   Bipolar Negative   Schizophrenia Negative   Depression Positive  patient takes zoloft, elavil and seroquel     Total Score   Opioid Risk Tool Scoring 6   Opioid Risk Interpretation Moderate Risk     ORT Scoring interpretation table:  Score <3 = Low Risk for SUD  Score between 4-7 = Moderate Risk for SUD  Score >8 = High Risk for Opioid Abuse   Risk Mitigation Strategies:  Patient Counseling: Covered Patient-Prescriber Agreement (PPA): Present and active  Notification to other healthcare providers: Done  Pharmacologic Plan: No change in therapy, at this time  Laboratory Chemistry  Inflammation Markers (CRP: Acute Phase) (ESR: Chronic Phase) Lab Results  Component Value Date   CRP <0.5 11/24/2015   ESRSEDRATE 36 (H) 11/24/2015                 Renal Function Markers Lab Results  Component Value Date   BUN 8 11/24/2015   CREATININE 0.80 05/11/2016   GFRAA >60 11/24/2015   GFRNONAA >60 11/24/2015                 Hepatic Function Markers Lab Results  Component Value Date   AST 23 11/24/2015   ALT 20 11/24/2015   ALBUMIN 3.7 11/24/2015   ALKPHOS 81 11/24/2015                 Electrolytes Lab Results  Component Value Date   NA 139 11/24/2015   K 4.3 11/24/2015   CL 105  11/24/2015   CALCIUM 8.6 (L) 11/24/2015   MG 1.6 (L) 11/24/2015                 Neuropathy Markers Lab Results  Component Value Date   VITAMINB12 303 11/24/2015                 Bone Pathology Markers Lab Results  Component Value Date   ALKPHOS 81 11/24/2015   VD25OH 27.4 (L) 11/24/2015   VD125OH2TOT 41.5 11/24/2015   CALCIUM 8.6  (L) 11/24/2015                 Coagulation Parameters Lab Results  Component Value Date   INR 0.95 05/07/2015   LABPROT 12.9 05/07/2015   APTT 26 05/07/2015   PLT 337 11/12/2015                 Cardiovascular Markers Lab Results  Component Value Date   HGB 12.4 11/12/2015   HCT 38.0 11/12/2015                 Note: Lab results reviewed.  Recent Diagnostic Imaging Review  Dg Shoulder Left  Result Date: 04/22/2017 CLINICAL DATA:  Left shoulder pain for the past 4 weeks. No known injury. EXAM: LEFT SHOULDER - 2+ VIEW COMPARISON:  None. FINDINGS: Mild greater tuberosity hyperostosis. Mild inferior clavicular spur formation at the [facility] joint. Small glenoid bone island. IMPRESSION: 1. Mild AC joint degenerative changes. 2. Mild diffuse greater tuberosity hyperostosis. This can be seen with rotator cuff tendinosis. Electronically Signed   By: Claudie Revering M.D.   On: 04/22/2017 19:47   Mm Screening Breast Tomo Bilateral  Result Date: 04/22/2017 CLINICAL DATA:  Screening. EXAM: 2D DIGITAL SCREENING BILATERAL MAMMOGRAM WITH CAD AND ADJUNCT TOMO COMPARISON:  Previous exam(s). ACR Breast Density Category a: The breast tissue is almost entirely fatty. FINDINGS: There are no findings suspicious for malignancy. Images were processed with CAD. IMPRESSION: No mammographic evidence of malignancy. A result letter of this screening mammogram will be mailed directly to the patient. RECOMMENDATION: Screening mammogram in one year. (Code:[id number]) BI-RADS CATEGORY  1: Negative. Electronically Signed   By: Lajean Manes M.D.   On: 04/22/2017 13:39   Note: Imaging results reviewed.          Meds   Current Outpatient Prescriptions:  .  Acetaminophen 500 MG coapsule, Take 500 mg by mouth every 6 (six) hours as needed. , Disp: , Rfl:  .  acyclovir (ZOVIRAX) 800 MG tablet, Take 800 mg by mouth 3 times/day as needed-between meals & bedtime. Reported on 11/12/2015, Disp: , Rfl:  .  albuterol (PROVENTIL  HFA;VENTOLIN HFA) 108 (90 BASE) MCG/ACT inhaler, Inhale 2 puffs into the lungs every 6 (six) hours as needed for wheezing or shortness of breath., Disp: , Rfl:  .  amitriptyline (ELAVIL) 100 MG tablet, TAKE ONE TABLET BY MOUTH AT BEDTIME, Disp: , Rfl:  .  azelastine (ASTELIN) 0.1 % nasal spray, Place 1 spray into the nose 2 (two) times daily. , Disp: , Rfl:  .  Blood Glucose Calibration (ACCU-CHEK AVIVA) SOLN, , Disp: , Rfl:  .  Blood Glucose Monitoring Suppl (ACCU-CHEK AVIVA PLUS) w/Device KIT, , Disp: , Rfl:  .  Blood Glucose Monitoring Suppl (FIFTY50 GLUCOSE METER 2.0) w/Device KIT, Use as directed. E11.0--ACCU CHEK AVIVA, Disp: , Rfl:  .  cephALEXin (KEFLEX) 250 MG capsule, Take 1 capsule (250 mg total) by mouth as needed. Take one capsule after intercourse, Disp: 90 capsule, Rfl:  3 .  ferrous sulfate 325 (65 FE) MG tablet, Take 325 mg by mouth daily with breakfast., Disp: , Rfl:  .  glipiZIDE (GLUCOTROL) 10 MG tablet, Take 2.5 mg by mouth 2 (two) times daily. , Disp: , Rfl:  .  guaiFENesin-codeine 100-10 MG/5ML syrup, Take by mouth., Disp: , Rfl:  .  Insulin Pen Needle (FIFTY50 PEN NEEDLES) 31G X 8 MM MISC, Use once daily., Disp: , Rfl:  .  ipratropium-albuterol (DUONEB) 0.5-2.5 (3) MG/3ML SOLN, 3 mLs as needed. , Disp: , Rfl:  .  ketoconazole (NIZORAL) 2 % cream, APPLY AS DIRECTED TO AFFECTED AREA TWICE DAILY AS NEEDED, Disp: , Rfl:  .  Lancet Devices (SIMPLE DIAGNOSTICS LANCING DEV) MISC, Use 1 each as directed. Accu Chek Softclix. DX: E11.8, Disp: , Rfl:  .  levocetirizine (XYZAL) 5 MG tablet, Take 5 mg by mouth every evening. , Disp: , Rfl:  .  levothyroxine (SYNTHROID, LEVOTHROID) 200 MCG tablet, Take on an empty stomach each morning before breakfast. Total dose 250 mcg daily., Disp: , Rfl:  .  levothyroxine (SYNTHROID, LEVOTHROID) 50 MCG tablet, Take 50 mcg by mouth daily before breakfast. , Disp: , Rfl:  .  magnesium oxide (MAG-OX) 400 MG tablet, Take 1 tablet (400 mg total) by mouth 2  (two) times daily., Disp: 60 tablet, Rfl: 2 .  meclizine (ANTIVERT) 25 MG tablet, Take 1 tablet (25 mg total) by mouth 3 (three) times daily as needed for dizziness., Disp: 30 tablet, Rfl: 0 .  medroxyPROGESTERone (DEPO-PROVERA) 150 MG/ML injection, Inject 150 mg into the muscle every 3 (three) months. , Disp: , Rfl:  .  metFORMIN (GLUCOPHAGE) 1000 MG tablet, Take 1,000 mg by mouth 2 (two) times daily with a meal. , Disp: , Rfl:  .  metoprolol (LOPRESSOR) 50 MG tablet, Take 75 mg by mouth 2 (two) times daily. , Disp: , Rfl:  .  montelukast (SINGULAIR) 10 MG tablet, Take 10 mg by mouth at bedtime. , Disp: , Rfl:  .  Multiple Vitamin (MULTIVITAMIN WITH MINERALS) TABS tablet, Take 2 tablets by mouth daily. Reported on 11/12/2015, Disp: , Rfl:  .  naloxone (NARCAN) nasal spray 4 mg/0.1 mL, Spray into one nostril. Repeat with second device into other nostril after 2-3 minutes if no or minimal response., Disp: 1 kit, Rfl: 0 .  omeprazole (PRILOSEC) 40 MG capsule, Take 40 mg by mouth 2 (two) times daily. , Disp: , Rfl:  .  oxyCODONE (OXY IR/ROXICODONE) 5 MG immediate release tablet, Take 1 tablet (5 mg total) by mouth 5 (five) times daily as needed for severe pain., Disp: 150 tablet, Rfl: 0 .  QUEtiapine (SEROQUEL) 100 MG tablet, Take 1 tablet (100 mg total) by mouth at bedtime., Disp: 90 tablet, Rfl: 0 .  ranitidine (ZANTAC) 150 MG capsule, Take by mouth., Disp: , Rfl:  .  rosuvastatin (CRESTOR) 40 MG tablet, Take 40 mg by mouth daily. , Disp: , Rfl:  .  sertraline (ZOLOFT) 100 MG tablet, Take 2 tablets (200 mg total) by mouth daily., Disp: 180 tablet, Rfl: 2 .  spironolactone (ALDACTONE) 25 MG tablet, Take 37.5 mg by mouth 2 (two) times daily. , Disp: , Rfl:  .  tiZANidine (ZANAFLEX) 4 MG tablet, 4 mg at bedtime. , Disp: , Rfl:  .  triamcinolone (KENALOG) 0.1 % paste, Apply 1 application topically 2 (two) times daily. , Disp: , Rfl:  .  triamcinolone (NASACORT AQ) 55 MCG/ACT AERO nasal inhaler, 2 sprays by  Nasal route 2 (  two) times daily., Disp: , Rfl:  .  TRUE METRIX BLOOD GLUCOSE TEST test strip, , Disp: , Rfl:  .  ULTICARE ALCOHOL SWABS PADS, Apply 1 each topically 2 (two) times daily. Use when checking BG. DX: E11.8, Disp: , Rfl:  .  vitamin B-12 (CYANOCOBALAMIN) 1000 MCG tablet, Take by mouth., Disp: , Rfl:  .  Vitamin D, Ergocalciferol, (DRISDOL) 50000 units CAPS capsule, Take 1 capsule (50,000 Units total) by mouth 2 (two) times a week. X 6 weeks., Disp: 12 capsule, Rfl: 0 .  zonisamide (ZONEGRAN) 50 MG capsule, Take 150 mg by mouth daily. 3 tabs at bedtime, Disp: , Rfl:  .  amitriptyline (ELAVIL) 100 MG tablet, Take 100 mg by mouth at bedtime. , Disp: , Rfl:  .  butalbital-acetaminophen-caffeine (FIORICET, ESGIC) 50-325-40 MG tablet, 1 tablet every 6 (six) hours as needed. , Disp: , Rfl:  .  cephALEXin (KEFLEX) 500 MG capsule, Take 1 capsule (500 mg total) by mouth 2 (two) times daily. (Patient not taking: Reported on 05/18/2017), Disp: 14 capsule, Rfl: 0 .  Dulaglutide 1.5 MG/0.5ML SOPN, Inject into the skin., Disp: , Rfl:  .  gabapentin (NEURONTIN) 800 MG tablet, Take 1 tablet (800 mg total) by mouth every 6 (six) hours., Disp: 360 tablet, Rfl: 0 .  levothyroxine (SYNTHROID, LEVOTHROID) 100 MCG tablet, Take on an empty stomach each morning before breakfast. Total dose 225 mcg daily., Disp: , Rfl:  .  nitrofurantoin, macrocrystal-monohydrate, (MACROBID) 100 MG capsule, Take 1 capsule (100 mg total) by mouth every 12 (twelve) hours. (Patient not taking: Reported on 05/18/2017), Disp: 14 capsule, Rfl: 0 .  oxyCODONE (OXY IR/ROXICODONE) 5 MG immediate release tablet, Take 1 tablet (5 mg total) by mouth 5 (five) times daily as needed for severe pain., Disp: 150 tablet, Rfl: 0 .  oxyCODONE (OXY IR/ROXICODONE) 5 MG immediate release tablet, Take 1 tablet (5 mg total) by mouth 5 (five) times daily as needed for severe pain., Disp: 150 tablet, Rfl: 0 .  phentermine 15 MG capsule, Take 37.5 mg by mouth. ,  Disp: , Rfl:  .  pregabalin (LYRICA) 100 MG capsule, Take 1 capsule (100 mg total) by mouth 3 (three) times daily., Disp: 270 capsule, Rfl: 0 .  ranitidine (ZANTAC) 150 MG capsule, Take 150 mg by mouth every evening. , Disp: , Rfl:  .  sucralfate (CARAFATE) 1 g tablet, Take 2 g by mouth 2 (two) times daily. , Disp: , Rfl:   ROS  Constitutional: Denies any fever or chills Gastrointestinal: No reported hemesis, hematochezia, vomiting, or acute GI distress Musculoskeletal: Denies any acute onset joint swelling, redness, loss of ROM, or weakness Neurological: No reported episodes of acute onset apraxia, aphasia, dysarthria, agnosia, amnesia, paralysis, loss of coordination, or loss of consciousness  Allergies  Ms. Prentiss is allergic to bactrim [sulfamethoxazole-trimethoprim]; xolair [omalizumab]; ciprofloxacin; aspirin; clindamycin; motrin [ibuprofen]; nsaids; other; and sulfa antibiotics.  [place]  Drug: Ms. Hiebert  reports that she does not use drugs. Alcohol:  reports that she does not drink alcohol. Tobacco:  reports that she has never smoked. She has never used smokeless tobacco. Medical:  has a past medical history of Anemia; Anginal pain ([place]); Anxiety; Arthralgia of hip (07/29/2015); Arthritis; Arthritis, degenerative (07/29/2015); Asthma; Cephalalgia (07/25/2014); Dependence on unknown drug ([place]); Depression; Diabetes mellitus without complication ([place]); Dysrhythmia; Eczema; Fibromyalgia; Gastritis; GERD (gastroesophageal reflux disease); Gonalgia (07/29/2015); Gout; H/O cardiovascular disorder (03/10/2015); H/O surgical procedure (12/05/2012); H/O thyroid disease (03/10/2015); Headache; Herpes; History of artificial joint (07/29/2015); History of  hiatal hernia; Hypertension; Hypomagnesemia; Hypothyroidism; LBP (low back pain) (07/29/2015); Neuromuscular disorder ([place]); Obesity; PCOS (polycystic ovarian syndrome); Primary osteoarthritis of both knees (07/29/2015); Sleep apnea; and Thyroid nodule  (bilateral). Surgical: Ms. Novosad  has a past surgical history that includes Laparoscopic partial gastrectomy; Shoulder arthroscopy (Right); Joint replacement (Left, hip); Carpal tunnel release (Bilateral); Diagnostic laparoscopy; Cholecystectomy; Trigger finger release (Right); Thyroidectomy (N/A, 11/12/2015); and left trigger finger. Family: family history includes Alcohol abuse in her father and mother; Anxiety disorder in her father and mother; Breast cancer in her paternal aunt; COPD in her father; Depression in her brother, father, and mother; Diabetes in her brother, father, and mother; Hypertension in her brother, father, and mother; Kidney cancer in her mother; Kidney failure in her father; Post-traumatic stress disorder in her father; Sleep apnea in her brother, father, and mother.  Constitutional Exam  General appearance: Well nourished, well developed, and well hydrated. In no apparent acute distress Vitals:   05/18/17 1134  BP: 123/79  Pulse: (!) 107  Resp: 16  Temp: 98.2 F (36.8 C)  TempSrc: Oral  SpO2: 98%  Weight: (!) 412 lb (186.9 kg)  Height: '5\' 4"'  (1.626 m)  Psych/Mental status: Alert, oriented x 3 (person, place, & time)       Eyes: PERLA Respiratory: No evidence of acute respiratory distress  Cervical Spine Area Exam  Skin & Axial Inspection: No masses, redness, edema, swelling, or associated skin lesions Alignment: Symmetrical Functional ROM: Unrestricted ROM      Stability: No instability detected Muscle Tone/Strength: Functionally intact. No obvious neuro-muscular anomalies detected. Sensory (Neurological): Unimpaired Palpation: No palpable anomalies              Upper Extremity (UE) Exam    Side: Right upper extremity  Side: Left upper extremity  Skin & Extremity Inspection: Skin color, temperature, and hair growth are WNL. No peripheral edema or cyanosis. No masses, redness, swelling, asymmetry, or associated skin lesions. No contractures.  Skin & Extremity  Inspection: Skin color, temperature, and hair growth are WNL. No peripheral edema or cyanosis. No masses, redness, swelling, asymmetry, or associated skin lesions. No contractures.  Functional ROM: Unrestricted ROM          Functional ROM: Unrestricted ROM          Muscle Tone/Strength: Functionally intact. No obvious neuro-muscular anomalies detected.  Muscle Tone/Strength: Functionally intact. No obvious neuro-muscular anomalies detected.  Sensory (Neurological): Unimpaired          Sensory (Neurological): Unimpaired          Palpation: No palpable anomalies              Palpation: No palpable anomalies              Specialized Test(s): Deferred         Specialized Test(s): Deferred          Thoracic Spine Area Exam  Skin & Axial Inspection: No masses, redness, or swelling Alignment: Symmetrical Functional ROM: Unrestricted ROM Stability: No instability detected Muscle Tone/Strength: Functionally intact. No obvious neuro-muscular anomalies detected. Sensory (Neurological): Unimpaired Muscle strength & Tone: No palpable anomalies  Lumbar Spine Area Exam  Skin & Axial Inspection: No masses, redness, or swelling Alignment: Symmetrical Functional ROM: Unrestricted ROM      Stability: No instability detected Muscle Tone/Strength: Functionally intact. No obvious neuro-muscular anomalies detected. Sensory (Neurological): Unimpaired Palpation: No palpable anomalies       Provocative Tests: Lumbar Hyperextension and rotation test: evaluation deferred today  Lumbar Lateral bending test: evaluation deferred today       Patrick's Maneuver: evaluation deferred today                    Gait & Posture Assessment  Ambulation: Patient ambulates using a wheel chair Gait: Relatively normal for age and body habitus Posture: WNL   Lower Extremity Exam    Side: Right lower extremity  Side: Left lower extremity  Skin & Extremity Inspection: Skin color, temperature, and hair growth are WNL. No  peripheral edema or cyanosis. No masses, redness, swelling, asymmetry, or associated skin lesions. No contractures.  Skin & Extremity Inspection: Skin color, temperature, and hair growth are WNL. No peripheral edema or cyanosis. No masses, redness, swelling, asymmetry, or associated skin lesions. No contractures.  Functional ROM: Unrestricted ROM          Functional ROM: Unrestricted ROM          Muscle Tone/Strength: Functionally intact. No obvious neuro-muscular anomalies detected.  Muscle Tone/Strength: Functionally intact. No obvious neuro-muscular anomalies detected.  Sensory (Neurological): Unimpaired  Sensory (Neurological): Unimpaired  Palpation: No palpable anomalies  Palpation: No palpable anomalies   Assessment  Primary Diagnosis & Pertinent Problem List: The primary encounter diagnosis was Chronic left shoulder pain. Diagnoses of Lumbar facet syndrome (Bilateral) (R>L), Fibromyalgia, Neurogenic pain, Hypomagnesemia, and Chronic pain syndrome were also pertinent to this visit.  Status Diagnosis  Controlled Controlled Controlled 1. Chronic left shoulder pain   2. Lumbar facet syndrome (Bilateral) (R>L)   3. Fibromyalgia   4. Neurogenic pain   5. Hypomagnesemia   6. Chronic pain syndrome     Problems updated and reviewed during this visit: Problem  Dysphagia, Unspecified   Overview:  RA 1723; IMO SWAP Resolution; 05/09/2017   Eczema  Uncomplicated Asthma   Plan of Care  Pharmacotherapy (Medications Ordered): Meds ordered this encounter  Medications  . gabapentin (NEURONTIN) 800 MG tablet    Sig: Take 1 tablet (800 mg total) by mouth every 6 (six) hours.    Dispense:  360 tablet    Refill:  0    Do not place this medication, or any other prescription from our practice, on "Automatic Refill". Patient may have prescription filled one day early if pharmacy is closed on scheduled refill date.    Order Specific Question:   Supervising Provider    Answer:   Milinda Pointer  [phone]  . magnesium oxide (MAG-OX) 400 MG tablet    Sig: Take 1 tablet (400 mg total) by mouth 2 (two) times daily.    Dispense:  60 tablet    Refill:  2    Do not place this medication, or any other prescription from our practice, on "Automatic Refill". Patient may have prescription filled one day early if pharmacy is closed on scheduled refill date.    Order Specific Question:   Supervising Provider    Answer:   Milinda Pointer [phone]  . pregabalin (LYRICA) 100 MG capsule    Sig: Take 1 capsule (100 mg total) by mouth 3 (three) times daily.    Dispense:  270 capsule    Refill:  0    Do not place this medication, or any other prescription from our practice, on "Automatic Refill". Patient may have prescription filled one day early if pharmacy is closed on scheduled refill date.    Order Specific Question:   Supervising Provider    Answer:   Milinda Pointer [[id number]]   New Prescriptions   PREGABALIN (  LYRICA) 100 MG CAPSULE    Take 1 capsule (100 mg total) by mouth 3 (three) times daily.   Medications administered today: Ms. Mckinlay had no medications administered during this visit. She has 3 unused prescriptions at home.   Lab-work, procedure(s), and/or referral(s): Orders Placed This Encounter  Procedures  . SHOULDER INJECTION   Imaging and/or referral(s): None  Interventional therapies: Planned, scheduled, and/or pending:   Left shoulder intra-articular steroid injection    Considering:   Series of 5 Hyalgan knee injections    Palliative PRN treatment(s):   Intra-articular Hyalgan knee injections.  Palliative bilateral genicular nerve block  Palliative bilateral lumbar facet block      Provider-requested follow-up: Return in about 3 months (around 08/17/2017) for MedMgmt.  Future Appointments Date Time Provider [place]  08/16/2017 11:00 AM Karen Francois, NP [facility] None   Primary Care Physician: Karen Crouch, MD Location: [facility]patient  Pain Management Facility Note by: Karen Francois NP Date: 05/18/2017; Time: 3:02 PM  Pain Score Disclaimer: We use the NRS-11 scale. This is a self-reported, subjective measurement of pain severity with only modest accuracy. It is used primarily to identify changes within a particular patient. It must be understood that outpatient pain scales are significantly less accurate that those used for research, where they can be applied under ideal controlled circumstances with minimal exposure to variables. In reality, the score is likely to be a combination of pain intensity and pain affect, where pain affect describes the degree of emotional arousal or changes in action readiness caused by the sensory experience of pain. Factors such as social and work situation, setting, emotional state, anxiety levels, expectation, and prior pain experience may influence pain perception and show large inter-individual differences that may also be affected by time variables.  Patient instructions provided during this appointment: Patient Instructions    ____________________________________________________________________________________________  Medication Rules  Applies to: All patients receiving prescriptions (written or electronic).  Pharmacy of record: Pharmacy where electronic prescriptions will be sent. If written prescriptions are taken to a different pharmacy, please inform the nursing staff. The pharmacy listed in the electronic medical record should be the one where you would like electronic prescriptions to be sent.  Prescription refills: Only during scheduled appointments. Applies to both, written and electronic prescriptions.  NOTE: The following applies primarily to controlled substances (Opioid* Pain Medications).   Patient's responsibilities: 1. Pain Pills: Bring all pain pills to every appointment (except for procedure appointments). 2. Pill Bottles: Bring pills in original pharmacy bottle.  Always bring newest bottle. Bring bottle, even if empty. 3. Medication refills: You are responsible for knowing and keeping track of what medications you need refilled. The day before your appointment, write a list of all prescriptions that need to be refilled. Bring that list to your appointment and give it to the admitting nurse. Prescriptions will be written only during appointments. If you forget a medication, it will not be "Called in", "Faxed", or "electronically sent". You will need to get another appointment to get these prescribed. 4. Prescription Accuracy: You are responsible for carefully inspecting your prescriptions before leaving our office. Have the discharge nurse carefully go over each prescription with you, before taking them home. Make sure that your name is accurately spelled, that your address is correct. Check the name and dose of your medication to make sure it is accurate. Check the number of pills, and the written instructions to make sure they are clear and accurate. Make sure that you  are given enough medication to last until your next medication refill appointment. 5. Taking Medication: Take medication as prescribed. Never take more pills than instructed. Never take medication more frequently than prescribed. Taking less pills or less frequently is permitted and encouraged, when it comes to controlled substances (written prescriptions).  6. Inform other Doctors: Always inform, all of your healthcare providers, of all the medications you take. 7. Pain Medication from other Providers: You are not allowed to accept any additional pain medication from any other Doctor or Healthcare provider. There are two exceptions to this rule. (see below) In the event that you require additional pain medication, you are responsible for notifying us, as stated below. 8. Medication Agreement: You are responsible for carefully reading and following our Medication Agreement. This must be signed before  receiving any prescriptions from our practice. Safely store a copy of your signed Agreement. Violations to the Agreement will result in no further prescriptions. (Additional copies of our Medication Agreement are available upon request.) 9. Laws, Rules, & Regulations: All patients are expected to follow all Federal and [employer], Rules, [employer]. Ignorance of the Laws does not constitute a valid excuse. The use of any illegal substances is prohibited. 10. Adopted CDC guidelines & recommendations: Target dosing levels will be at or below 60 MME/day. Use of benzodiazepines** is not recommended.  Exceptions: There are only two exceptions to the rule of not receiving pain medications from other Healthcare Providers. 1. Exception #1 (Emergencies): In the event of an emergency (i.e.: accident requiring emergency care), you are allowed to receive additional pain medication. However, you are responsible for: As soon as you are able, call our office [phone], at any time of the day or night, and leave a message stating your name, the date and nature of the emergency, and the name and dose of the medication prescribed. In the event that your call is answered by a member of our staff, make sure to document and save the date, time, and the name of the person that took your information.  2. Exception #2 (Planned Surgery): In the event that you are scheduled by another doctor or dentist to have any type of surgery or procedure, you are allowed (for a period no longer than 30 days), to receive additional pain medication, for the acute post-op pain. However, in this case, you are responsible for picking up a copy of our "Post-op Pain Management for Surgeons" handout, and giving it to your surgeon or dentist. This document is available at our office, and does not require an appointment to obtain it. Simply go to our office during business hours (Monday-Thursday from 8:00 AM to 4:00 PM) (Friday 8:00 AM to  12:00 Noon) or if you have a scheduled appointment with [place], prior to your surgery, and ask for it by name. In addition, you will need to provide [place] with your name, name of your surgeon, type of surgery, and date of procedure or surgery.  *Opioid medications include: morphine, codeine, oxycodone, oxymorphone, hydrocodone, hydromorphone, meperidine, tramadol, tapentadol, buprenorphine, fentanyl, methadone. **Benzodiazepine medications include: diazepam (Valium), alprazolam (Xanax), clonazepam (Klonopine), lorazepam (Ativan), clorazepate (Tranxene), chlordiazepoxide (Librium), estazolam (Prosom), oxazepam (Serax), temazepam (Restoril), triazolam (Halcion)  ____________________________________________________________________________________________  BMI Assessment: Estimated body mass index is 70.72 kg/m as calculated from the following:   Height as of this encounter: '5\' 4"'  (1.626 m).   Weight as of this encounter: 412 lb (186.9 kg).  BMI interpretation table: BMI level Category Range association with higher incidence  of chronic pain  <18 kg/m2 Underweight   18.5-24.9 kg/m2 Ideal body weight   25-29.9 kg/m2 Overweight Increased incidence by 20%  30-34.9 kg/m2 Obese (Class I) Increased incidence by 68%  35-39.9 kg/m2 Severe obesity (Class II) Increased incidence by 136%  >40 kg/m2 Extreme obesity (Class III) Increased incidence by 254%   BMI Readings from Last 4 Encounters:  05/18/17 70.72 kg/m  04/25/17 72.69 kg/m  03/07/17 72.78 kg/m  02/17/17 83.98 kg/m   Wt Readings from Last 4 Encounters:  05/18/17 (!) 412 lb (186.9 kg)  04/25/17 (!) 423 lb 8 oz (192.1 kg)  03/07/17 (!) 424 lb (192.3 kg)  02/17/17 (!) 430 lb (195 kg)    Gabapentin and magnesium sent to pharmacy [address] Prescription given for Lyrica  Trigger Point Injections Patient Information  Description: Trigger points are areas of muscle sensitive to touch which cause pain with movement, sometimes felt some  distance from the site of palpation.  Usually the muscle containing these trigger points if felt as a tight band or knot.   The area of maximum tenderness or trigger point is identified, and after antiseptic preparation of the skin, a small needle is placed into this site.  Reproduction of the pain often occurs and numbing medicine (local anesthetic) is injected into the site, sometimes along with steroid preparation.  The entire block usually lasts less than 5 minutes.  Conditions which may be treated by trigger points:   Muscular pain and spasm  Nerve irritation  Preparation for the injection:  1. Do not eat any solid food or dairy products within 8 hours of your appointment. 2. You may drink clear liquids up to 3 hours before appointment.  Clear liquids include water, black coffee, juice or soda.  No milk or cream please. 3. You may take your regular medications, including pain medications, with a sip of water before your appointment.  Diabetics should hold regular insulin ( if take separately) and take 1/2 normal NPH dose the morning of the procedure.  Carry some sugar containing items with you to your appointment. 4. A driver must accompany you and be prepared to drive you home after your procedure.  5. Bring all your current medications with you. 6. An IV may be inserted and sedation may be given at the discretion of the physician.  7. A blood pressure cuff, EKG, and other monitors will often be applied during the procedure.  Some patients may need to have extra oxygen administered for a short period. 8. You will be asked to provide medical information, including your allergies and medications, prior to the procedure.  We must know immediately if you are taking blood thinners (like Coumadin/Warfarin) or if you are allergic to IV iodine contrast (dye).  We must know if you could possibly be pregnant.  Possible side-effects:   Bleeding from needle site  Infection (rare, may require  surgery)  Nerve injury (rare)  Numbness & tingling (temporary)  Punctured lung (if injection around chest)  Light-headedness (temporary)  Pain at injection site (several days)  Decreased blood pressure (rare, temporary)  Weakness in arm/leg (temporary)  Call if you experience:   Hive or difficulty breathing (go to the emergency room)  Inflammation or drainage at the injection site(s)  Please note:  Although the local anesthetic injected can often make your painful muscle feel good for several hours after the injection, the pain may return.  It takes 3-7 days for steroids to work.  You may not notice  any pain relief for at least one week.  If effective, we will often do a series of injections spaced 3-6 weeks apart to maximally decrease your pain.  If you have any questions please call [phone] [facility]

## 2017-05-18 NOTE — Progress Notes (Signed)
Pt presented today with c/o cloudy and foul smelling urine, urinary urgency, incontinence, and hard to postpone urination. A cath specimen was obtained for u/a and cx.  Blood pressure 120/75, pulse 91, height 5\' 4"  (1.626 m), weight (!) 427 lb 1.6 oz (193.7 kg).

## 2017-05-18 NOTE — Patient Instructions (Addendum)
____________________________________________________________________________________________  Medication Rules  Applies to: All patients receiving prescriptions (written or electronic).  Pharmacy of record: Pharmacy where electronic prescriptions will be sent. If written prescriptions are taken to a different pharmacy, please inform the nursing staff. The pharmacy listed in the electronic medical record should be the one where you would like electronic prescriptions to be sent.  Prescription refills: Only during scheduled appointments. Applies to both, written and electronic prescriptions.  NOTE: The following applies primarily to controlled substances (Opioid* Pain Medications).   Patient's responsibilities: 1. Pain Pills: Bring all pain pills to every appointment (except for procedure appointments). 2. Pill Bottles: Bring pills in original pharmacy bottle. Always bring newest bottle. Bring bottle, even if empty. 3. Medication refills: You are responsible for knowing and keeping track of what medications you need refilled. The day before your appointment, write a list of all prescriptions that need to be refilled. Bring that list to your appointment and give it to the admitting nurse. Prescriptions will be written only during appointments. If you forget a medication, it will not be "Called in", "Faxed", or "electronically sent". You will need to get another appointment to get these prescribed. 4. Prescription Accuracy: You are responsible for carefully inspecting your prescriptions before leaving our office. Have the discharge nurse carefully go over each prescription with you, before taking them home. Make sure that your name is accurately spelled, that your address is correct. Check the name and dose of your medication to make sure it is accurate. Check the number of pills, and the written instructions to make sure they are clear and accurate. Make sure that you are given enough medication to  last until your next medication refill appointment. 5. Taking Medication: Take medication as prescribed. Never take more pills than instructed. Never take medication more frequently than prescribed. Taking less pills or less frequently is permitted and encouraged, when it comes to controlled substances (written prescriptions).  6. Inform other Doctors: Always inform, all of your healthcare providers, of all the medications you take. 7. Pain Medication from other Providers: You are not allowed to accept any additional pain medication from any other Doctor or Healthcare provider. There are two exceptions to this rule. (see below) In the event that you require additional pain medication, you are responsible for notifying us, as stated below. 8. Medication Agreement: You are responsible for carefully reading and following our Medication Agreement. This must be signed before receiving any prescriptions from our practice. Safely store a copy of your signed Agreement. Violations to the Agreement will result in no further prescriptions. (Additional copies of our Medication Agreement are available upon request.) 9. Laws, Rules, & Regulations: All patients are expected to follow all Federal and State Laws, Statutes, Rules, & Regulations. Ignorance of the Laws does not constitute a valid excuse. The use of any illegal substances is prohibited. 10. Adopted CDC guidelines & recommendations: Target dosing levels will be at or below 60 MME/day. Use of benzodiazepines** is not recommended.  Exceptions: There are only two exceptions to the rule of not receiving pain medications from other Healthcare Providers. 1. Exception #1 (Emergencies): In the event of an emergency (i.e.: accident requiring emergency care), you are allowed to receive additional pain medication. However, you are responsible for: As soon as you are able, call our office [phone], at any time of the day or night, and leave a message stating your  name, the date and nature of the emergency, and the name and dose of the medication   prescribed. In the event that your call is answered by a member of our staff, make sure to document and save the date, time, and the name of the person that took your information.  2. Exception #2 (Planned Surgery): In the event that you are scheduled by another doctor or dentist to have any type of surgery or procedure, you are allowed (for a period no longer than 30 days), to receive additional pain medication, for the acute post-op pain. However, in this case, you are responsible for picking up a copy of our "Post-op Pain Management for Surgeons" handout, and giving it to your surgeon or dentist. This document is available at our office, and does not require an appointment to obtain it. Simply go to our office during business hours (Monday-Thursday from 8:00 AM to 4:00 PM) (Friday 8:00 AM to 12:00 Noon) or if you have a scheduled appointment with [place], prior to your surgery, and ask for it by name. In addition, you will need to provide [place] with your name, name of your surgeon, type of surgery, and date of procedure or surgery.  *Opioid medications include: morphine, codeine, oxycodone, oxymorphone, hydrocodone, hydromorphone, meperidine, tramadol, tapentadol, buprenorphine, fentanyl, methadone. **Benzodiazepine medications include: diazepam (Valium), alprazolam (Xanax), clonazepam (Klonopine), lorazepam (Ativan), clorazepate (Tranxene), chlordiazepoxide (Librium), estazolam (Prosom), oxazepam (Serax), temazepam (Restoril), triazolam (Halcion)  ____________________________________________________________________________________________  BMI Assessment: Estimated body mass index is 70.72 kg/m as calculated from the following:   Height as of this encounter: 5\' 4"  (1.626 m).   Weight as of this encounter: 412 lb (186.9 kg).  BMI interpretation table: BMI level Category Range association with higher incidence of chronic pain   <18 kg/m2 Underweight   18.5-24.9 kg/m2 Ideal body weight   25-29.9 kg/m2 Overweight Increased incidence by 20%  30-34.9 kg/m2 Obese (Class I) Increased incidence by 68%  35-39.9 kg/m2 Severe obesity (Class II) Increased incidence by 136%  >40 kg/m2 Extreme obesity (Class III) Increased incidence by 254%   BMI Readings from Last 4 Encounters:  05/18/17 70.72 kg/m  04/25/17 72.69 kg/m  03/07/17 72.78 kg/m  02/17/17 83.98 kg/m   Wt Readings from Last 4 Encounters:  05/18/17 (!) 412 lb (186.9 kg)  04/25/17 (!) 423 lb 8 oz (192.1 kg)  03/07/17 (!) 424 lb (192.3 kg)  02/17/17 (!) 430 lb (195 kg)    Gabapentin and magnesium sent to pharmacy [facility] [address] Prescription given for Lyrica  Trigger Point Injections Patient Information  Description: Trigger points are areas of muscle sensitive to touch which cause pain with movement, sometimes felt some distance from the site of palpation.  Usually the muscle containing these trigger points if felt as a tight band or knot.   The area of maximum tenderness or trigger point is identified, and after antiseptic preparation of the skin, a small needle is placed into this site.  Reproduction of the pain often occurs and numbing medicine (local anesthetic) is injected into the site, sometimes along with steroid preparation.  The entire block usually lasts less than 5 minutes.  Conditions which may be treated by trigger points:   Muscular pain and spasm  Nerve irritation  Preparation for the injection:  1. Do not eat any solid food or dairy products within 8 hours of your appointment. 2. You may drink clear liquids up to 3 hours before appointment.  Clear liquids include water, black coffee, juice or soda.  No milk or cream please. 3. You may take your regular medications, including pain medications, with a sip  of water before your appointment.  Diabetics should hold regular insulin ( if take separately) and take 1/2 normal NPH dose the  morning of the procedure.  Carry some sugar containing items with you to your appointment. 4. A driver must accompany you and be prepared to drive you home after your procedure.  5. Bring all your current medications with you. 6. An IV may be inserted and sedation may be given at the discretion of the physician.  7. A blood pressure cuff, EKG, and other monitors will often be applied during the procedure.  Some patients may need to have extra oxygen administered for a short period. 8. You will be asked to provide medical information, including your allergies and medications, prior to the procedure.  We must know immediately if you are taking blood thinners (like Coumadin/Warfarin) or if you are allergic to IV iodine contrast (dye).  We must know if you could possibly be pregnant.  Possible side-effects:   Bleeding from needle site  Infection (rare, may require surgery)  Nerve injury (rare)  Numbness & tingling (temporary)  Punctured lung (if injection around chest)  Light-headedness (temporary)  Pain at injection site (several days)  Decreased blood pressure (rare, temporary)  Weakness in arm/leg (temporary)  Call if you experience:   Hive or difficulty breathing (go to the emergency room)  Inflammation or drainage at the injection site(s)  Please note:  Although the local anesthetic injected can often make your painful muscle feel good for several hours after the injection, the pain may return.  It takes 3-7 days for steroids to work.  You may not notice any pain relief for at least one week.  If effective, we will often do a series of injections spaced 3-6 weeks apart to maximally decrease your pain.  If you have any questions please call [phone] [facility]

## 2017-05-18 NOTE — Progress Notes (Signed)
Nursing Pain Medication Assessment:  Safety precautions to be maintained throughout the outpatient stay will include: orient to surroundings, keep bed in low position, maintain call bell within reach at all times, provide assistance with transfer out of bed and ambulation.  Medication Inspection Compliance: Pill count conducted under aseptic conditions, in front of the patient. Neither the pills nor the bottle was removed from the patient's sight at any time. Once count was completed pills were immediately returned to the patient in their original bottle.  Medication: Oxycodone IR Pill/Patch Count: 37 of 150 pills remain Pill/Patch Appearance: Markings consistent with prescribed medication Bottle Appearance: Standard pharmacy container. Clearly labeled. Filled Date: 07 / 16 / 2018 Last Medication intake:  Today

## 2017-05-21 LAB — CULTURE, URINE COMPREHENSIVE

## 2017-05-23 ENCOUNTER — Telehealth: Payer: Self-pay

## 2017-05-23 MED ORDER — NITROFURANTOIN MONOHYD MACRO 100 MG PO CAPS: 100.0000 mg | ORAL_CAPSULE | Freq: Two times a day (BID) | ORAL | 0 refills | Status: DC

## 2017-05-23 NOTE — Telephone Encounter (Signed)
-----   Message from Nori Riis, PA-C sent at 05/23/2017  8:01 AM EDT ----- Please let Ms. Amison know that her urine culture was positive for infection.  She needs to start Macrobid 100 mg, one capsule twice daily x seven days.

## 2017-05-23 NOTE — Telephone Encounter (Signed)
Patient notified on vmail, script sent to pharm  

## 2017-05-25 ENCOUNTER — Ambulatory Visit: Payer: Medicare HMO | Admitting: Pain Medicine

## 2017-05-26 ENCOUNTER — Ambulatory Visit: Payer: Medicare HMO | Admitting: Pain Medicine

## 2017-05-31 ENCOUNTER — Ambulatory Visit
Admission: RE | Admit: 2017-05-31 | Discharge: 2017-05-31 | Disposition: A | Discharge status: Home / Self Care | Payer: Medicare HMO | Source: Ambulatory Visit | Attending: Pain Medicine | Admitting: Pain Medicine

## 2017-05-31 ENCOUNTER — Ambulatory Visit (HOSPITAL_BASED_OUTPATIENT_CLINIC_OR_DEPARTMENT_OTHER): Payer: Medicare HMO | Admitting: Pain Medicine

## 2017-05-31 ENCOUNTER — Encounter: Payer: Self-pay | Admitting: Pain Medicine

## 2017-05-31 VITALS — BP 133/55 | HR 116 | Temp 98.3°F | Resp 25 | Ht 64.0 in | Wt >= 6400 oz

## 2017-05-31 DIAGNOSIS — Z9889 Other specified postprocedural states: Secondary | ICD-10-CM | POA: Diagnosis not present

## 2017-05-31 DIAGNOSIS — Z9049 Acquired absence of other specified parts of digestive tract: Secondary | ICD-10-CM | POA: Insufficient documentation

## 2017-05-31 DIAGNOSIS — M25512 Pain in left shoulder: Secondary | ICD-10-CM | POA: Diagnosis not present

## 2017-05-31 DIAGNOSIS — Z882 Allergy status to sulfonamides status: Secondary | ICD-10-CM | POA: Insufficient documentation

## 2017-05-31 DIAGNOSIS — Z881 Allergy status to other antibiotic agents status: Secondary | ICD-10-CM | POA: Diagnosis not present

## 2017-05-31 DIAGNOSIS — Z903 Acquired absence of stomach [part of]: Secondary | ICD-10-CM | POA: Diagnosis not present

## 2017-05-31 DIAGNOSIS — M19012 Primary osteoarthritis, left shoulder: Secondary | ICD-10-CM | POA: Insufficient documentation

## 2017-05-31 DIAGNOSIS — Z886 Allergy status to analgesic agent status: Secondary | ICD-10-CM | POA: Insufficient documentation

## 2017-05-31 DIAGNOSIS — Z888 Allergy status to other drugs, medicaments and biological substances status: Secondary | ICD-10-CM | POA: Diagnosis not present

## 2017-05-31 DIAGNOSIS — G8929 Other chronic pain: Secondary | ICD-10-CM | POA: Diagnosis not present

## 2017-05-31 MED ORDER — FENTANYL CITRATE (PF) 100 MCG/2ML IJ SOLN
25.0000 ug | INTRAMUSCULAR | Status: DC | PRN
  Filled 2017-05-31: qty 2

## 2017-05-31 MED ORDER — MIDAZOLAM HCL 5 MG/5ML IJ SOLN
1.0000 mg | INTRAMUSCULAR | Status: DC | PRN
  Filled 2017-05-31: qty 5

## 2017-05-31 MED ORDER — ROPIVACAINE HCL 2 MG/ML IJ SOLN
4.0000 mL | Freq: Once | INTRAMUSCULAR | Status: AC
  Administered 2017-05-31: 10 mL
  Filled 2017-05-31: qty 10

## 2017-05-31 MED ORDER — METHYLPREDNISOLONE ACETATE 80 MG/ML IJ SUSP
80.0000 mg | Freq: Once | INTRAMUSCULAR | Status: AC
  Administered 2017-05-31: 80 mg
  Filled 2017-05-31: qty 1

## 2017-05-31 MED ORDER — LACTATED RINGERS IV SOLN
1000.0000 mL | Freq: Once | INTRAVENOUS | Status: AC
  Administered 2017-05-31: 1000 mL via INTRAVENOUS

## 2017-05-31 MED ORDER — LIDOCAINE HCL 2 % IJ SOLN
20.0000 mL | Freq: Once | INTRAMUSCULAR | Status: AC
  Administered 2017-05-31: 400 mg
  Filled 2017-05-31: qty 100

## 2017-05-31 NOTE — Progress Notes (Signed)
Safety precautions to be maintained throughout the outpatient stay will include: orient to surroundings, keep bed in low position, maintain call bell within reach at all times, provide assistance with transfer out of bed and ambulation.  

## 2017-05-31 NOTE — Patient Instructions (Addendum)
____________________________________________________________________________________________  Post-Procedure instructions Instructions:  Apply ice: Fill a plastic sandwich bag with crushed ice. Cover it with a small towel and apply to injection site. Apply for 15 minutes then remove x 15 minutes. Repeat sequence on day of procedure, until you go to bed. The purpose is to minimize swelling and discomfort after procedure.  Apply heat: Apply heat to procedure site starting the day following the procedure. The purpose is to treat any soreness and discomfort from the procedure.  Food intake: Start with clear liquids (like water) and advance to regular food, as tolerated.   Physical activities: Keep activities to a minimum for the first 8 hours after the procedure.   Driving: If you have received any sedation, you are not allowed to drive for 24 hours after your procedure.  Blood thinner: Restart your blood thinner 6 hours after your procedure. (Only for those taking blood thinners)  Insulin: As soon as you can eat, you may resume your normal dosing schedule. (Only for those taking insulin)  Infection prevention: Keep procedure site clean and dry.  Post-procedure Pain Diary: Extremely important that this be done correctly and accurately. Recorded information will be used to determine the next step in treatment.  Pain evaluated is that of treated area only. Do not include pain from an untreated area.  Complete every hour, on the hour, for the initial 8 hours. Set an alarm to help you do this part accurately.  Do not go to sleep and have it completed later. It will not be accurate.  Follow-up appointment: Keep your follow-up appointment after the procedure. Usually 2 weeks for most procedures. (6 weeks in the case of radiofrequency.) Bring you pain diary.  Expect:  From numbing medicine (AKA: Local Anesthetics): Numbness or decrease in pain.  Onset: Full effect within 15 minutes of  injected.  Duration: It will depend on the type of local anesthetic used. On the average, 1 to 8 hours.   From steroids: Decrease in swelling or inflammation. Once inflammation is improved, relief of the pain will follow.  Onset of benefits: Depends on the amount of swelling present. The more swelling, the longer it will take for the benefits to be seen. In some cases, up to 10 days.  Duration: Steroids will stay in the system x 2 weeks. Duration of benefits will depend on multiple posibilities including persistent irritating factors.  From procedure: Some discomfort is to be expected once the numbing medicine wears off. This should be minimal if ice and heat are applied as instructed. Call if:  You experience numbness and weakness that gets worse with time, as opposed to wearing off.  New onset bowel or bladder incontinence. (Spinal procedures only)  Emergency Numbers:  [place] hours (Monday - Thursday, 8:00 AM - 4:00 PM) (Friday, 9:00 AM - 12:00 Noon): [phone]  After hours: [phone] ____________________________________________________________________________________________  Post-procedure Information What to expect: Most procedures involve the use of a local anesthetic (numbing medicine), and a steroid (anti-inflammatory medicine).  The local anesthetics may cause temporary numbness and weakness of the legs or arms, depending on the location of the block. This numbness/weakness may last 4-6 hours, depending on the local anesthetic used. In rare instances, it can last up to 24 hours. While numb, you must be very careful not to injure the extremity.  After any procedure, you could expect the pain to get better within 15-20 minutes. This relief is temporary and may last 4-6 hours. Once the local anesthetics wears off,  you could experience discomfort, possibly more than usual, for up to 10 (ten) days. In the case of radiofrequencies, it may last up to 6 weeks.  Surgeries may take up to 8 weeks for the healing process. The discomfort is due to the irritation caused by needles going through skin and muscle. To minimize the discomfort, we recommend using ice the first day, and heat from then on. The ice should be applied for 15 minutes on, and 15 minutes off. Keep repeating this cycle until bedtime. Avoid applying the ice directly to the skin, to prevent frostbite. Heat should be used daily, until the pain improves (4-10 days). Be careful not to burn yourself.  Occasionally you may experience muscle spasms or cramps. These occur as a consequence of the irritation caused by the needle sticks to the muscle and the blood that will inevitably be lost into the surrounding muscle tissue. Blood tends to be very irritating to tissues, which tend to react by going into spasm. These spasms may start the same day of your procedure, but they may also take days to develop. This late onset type of spasm or cramp is usually caused by electrolyte imbalances triggered by the steroids, at the level of the kidney. Cramps and spasms tend to respond well to muscle relaxants, multivitamins (some are triggered by the procedure, but may have their origins in vitamin deficiencies), and "Gatorade", or any sports drinks that can replenish any electrolyte imbalances. (If you are a diabetic, ask your pharmacist to get you a sugar-free brand.) Warm showers or baths may also be helpful. Stretching exercises are highly recommended. General Instructions:  Be alert for signs of possible infection: redness, swelling, heat, red streaks, elevated temperature, and/or fever. These typically appear 4 to 6 days after the procedure. Immediately notify your doctor if you experience unusual bleeding, difficulty breathing, or loss of bowel or bladder control. If you experience increased pain, do not increase your pain medicine intake, unless instructed by your pain physician. Post-Procedure Care:  Be careful in  moving about. Muscle spasms in the area of the injection may occur. Applying ice or heat to the area is often helpful. The incidence of spinal headaches after epidural injections ranges between 1.4% and 6%. If you develop a headache that does not seem to respond to conservative therapy, please let your physician know. This can be treated with an epidural blood patch.   Post-procedure numbness or redness is to be expected, however it should average 4 to 6 hours. If numbness and weakness of your extremities begins to develop 4 to 6 hours after your procedure, and is felt to be progressing and worsening, immediately contact your physician.   Diet:  If you experience nausea, do not eat until this sensation goes away. If you had a "Stellate Ganglion Block" for upper extremity "Reflex Sympathetic Dystrophy", do not eat or drink until your hoarseness goes away. In any case, always start with liquids first and if you tolerate them well, then slowly progress to more solid foods. Activity:  For the first 4 to 6 hours after the procedure, use caution in moving about as you may experience numbness and/or weakness. Use caution in cooking, using household electrical appliances, and climbing steps. If you need to reach your Doctor call our office: [phone]) [phone] Monday-Thursday 8:00 am - 4:00 PM    Fridays: Closed     In case of an emergency: In case of emergency, call 911 or go to the nearest emergency room and have  the physician there call us.  Interpretation of Procedure Every nerve block has two components: a diagnostic component, and a treatment component. Unrealistic expectations are the most common causes of "perceived failure".  In a perfect world, a single nerve block should be able to completely and permanently eliminate the pain. Sadly, the world is not perfect.  Most pain management nerve blocks are performed using local anesthetics and steroids. Steroids are responsible for any long-term benefit that  you may experience. Their purpose is to decrease any chronic swelling that may exist in the area. Steroids begin to work immediately after being injected. However, most patients will not experience any benefits until 5 to 10 days after the injection, when the swelling has come down to the point where they can tell a difference. Steroids will only help if there is swelling to be treated. As such, they can assist with the diagnosis. If effective, they suggest an inflammatory component to the pain, and if ineffective, they rule out inflammation as the main cause or component of the problem. If the problem is one of mechanical compression, you will get no benefit from those steroids.   In the case of local anesthetics, they have a crucial role in the diagnosis of your condition. Most will begin to work within15 to 20 minutes after injection. The duration will depend on the type used (short- vs. Long-acting). It is of outmost importance that patients keep tract of their pain, after the procedure. To assist with this matter, a "Post-procedure Pain Diary" is provided. Make sure to complete it and to bring it back to your follow-up appointment.  As long as the patient keeps accurate, detailed records of their symptoms after every procedure, and returns to have those interpreted, every procedure will provide [place] with invaluable information. Even a block that does not provide the patient with any relief, will always provide [place] with information about the mechanism and the origin of the pain. The only time a nerve block can be considered a waste of time is when patients do not keep track of the results, or do not keep their post-procedure appointment.  Reporting the results back to your physician The Pain Score  Pain is a subjective complaint. It cannot be seen, touched, or measured. We depend entirely on the patient's report of the pain in order to assess your condition and treatment. To evaluate the pain, we use a pain  scale, where "0" means "No Pain", and a "10" is "the worst possible pain that you can even imagine" (i.e. something like been eaten alive by a shark or being torn apart by a lion).   You will frequently be asked to rate your pain. Please be as accurate, remember that medical decisions will be based on your responses. Please do not rate your pain above a 10. Doing so is actually interpreted as "symptom magnification" (exaggeration), as well as lack of understanding with regards to the scale. To put this into perspective, when you tell us that your pain is at a 10 (ten), what you are saying is that there is nothing we can do to make this pain any worse. (Carefully think about that.) Post-Procedure Pain Diary   Name: Date of Service Procedure      Time Period Pain Score Painful Area  Pre-procedure ____/10   Time Period Pain Score Area improved. Area not improved.  15 to 30 min post-procedure ____/10    1st hour after procedure ____/10    2nd hour after procedure  ____/10    3rd hour after procedure ____/10    4th hour after procedure ____/10    5th hour after procedure ____/10    6th hour after procedure ____/10    7th hour after procedure ____/10    Time Period Pain Score Area improved. Area not improved.  Note: From here on, always document your pain score 1st thing in the morning.  1st day after procedure ____/10    2nd day after procedure ____/10    3rd day after procedure ____/10    4th day after procedure ____/10    5th day after procedure ____/10    Time Period Pain Score Area improved. Area not improved.  10th day after procedure ____/10    20th day after procedure ____/10     Benefits Indicate for each set of activities if the procedure changes your ability to accomplish them.  Activity Worse No-Change Improved  Dressing, eating, walking, toileting, hygiene     Shopping, housekeeping, food preparation, community transportation     Range of motion of affected area      Your  opinion Please indicate which statement best describes your impression of this treatment.  Statement (X)  Based on the results, I am encouraged.   Based on the results, I am disappointed.   I am not sure I have an opinion at this point.    Note: Make sure to complete and return this form to your physician, on your follow-up appointment. This information will be used to interpret the results. Failure to accurately complete, or to return this information, may result in less than optimal outcomes.

## 2017-05-31 NOTE — Progress Notes (Addendum)
Patient's Name: Karen Lewis  MRN: [id number]  Referring Provider: Idelle Crouch, MD  DOB: 01-05-1971  PCP: Idelle Crouch, MD  DOS: 05/31/2017  Note by: Gaspar Cola, MD  Service setting: Ambulatory outpatient  Specialty: Interventional Pain Management  Patient type: Established  Location: [facility] (AMB) Pain Management Facility  Visit type: Interventional Procedure   Primary Reason for Visit: Interventional Pain Management Treatment. CC: Shoulder Pain (left)  Procedure:  Anesthesia, Analgesia, Anxiolysis:  Type: Therapeutic Glonohumeral Joint (shoulder) Injection Region: Superior Shoulder Area Level: Shoulder Laterality: Left  Type: Local Anesthesia with Moderate (Conscious) Sedation Local Anesthetic: Lidocaine 1% Route: Intravenous (IV) IV Access: Secured Sedation: Meaningful verbal contact was maintained at all times during the procedure  Indication(s): Analgesia and Anxiety  Indications: 1. Chronic shoulder pain (Left)   2. Osteoarthritis of shoulder (Left)    Pain Score: Pre-procedure: 4 /10 Post-procedure: 0-No pain/10  Pre-op Assessment:  Ms. Curb is a 46 y.o. (year old), female patient, seen today for interventional treatment. She  has a past surgical history that includes Laparoscopic partial gastrectomy; Shoulder arthroscopy (Right); Joint replacement (Left, hip); Carpal tunnel release (Bilateral); Diagnostic laparoscopy; Cholecystectomy; Trigger finger release (Right); Thyroidectomy (N/A, 11/12/2015); and left trigger finger. Ms. Cardosa has a current medication list which includes the following prescription(s): acetaminophen, acyclovir, albuterol, amitriptyline, azelastine, accu-chek aviva, accu-chek aviva plus, fifty50 glucose meter 2.0, butalbital-acetaminophen-caffeine, ferrous sulfate, gabapentin, guaifenesin-codeine, insulin pen needle, ipratropium-albuterol, ketoconazole, simple diagnostics lancing dev, levocetirizine, levothyroxine, levothyroxine, magnesium oxide,  meclizine, medroxyprogesterone, metformin, metoprolol tartrate, montelukast, multivitamin with minerals, naloxone, nitrofurantoin (macrocrystal-monohydrate), omeprazole, oxycodone, pregabalin, ranitidine, rosuvastatin, sertraline, spironolactone, tizanidine, triamcinolone, triamcinolone, true metrix blood glucose test, ulticare alcohol swabs, vitamin b-12, vitamin d (ergocalciferol), zonisamide, amitriptyline, cephalexin, cephalexin, dulaglutide, glipizide, levothyroxine, nitrofurantoin (macrocrystal-monohydrate), oxycodone, oxycodone, phentermine, quetiapine, ranitidine, and sucralfate, and the following Facility-Administered Medications: fentanyl and midazolam. Her primarily concern today is the Shoulder Pain (left)  Initial Vital Signs: Blood pressure (!) 142/89, pulse (!) 116, temperature 98.9 F (37.2 C), temperature source Oral, resp. rate 18, height 5\' 4"  (1.626 m), weight (!) 417 lb (189.1 kg), SpO2 98 %. BMI: Estimated body mass index is 71.58 kg/m as calculated from the following:   Height as of this encounter: 5\' 4"  (1.626 m).   Weight as of this encounter: 417 lb (189.1 kg).  Risk Assessment: Allergies: Reviewed. She is allergic to bactrim [sulfamethoxazole-trimethoprim]; xolair [omalizumab]; ciprofloxacin; aspirin; clindamycin; motrin [ibuprofen]; nsaids; other; and sulfa antibiotics.  Allergy Precautions: None required Coagulopathies: Reviewed. None identified.  Blood-thinner therapy: None at this time Active Infection(s): Reviewed. None identified. Ms. Venne is afebrile  Site Confirmation: Ms. Upperman was asked to confirm the procedure and laterality before marking the site Procedure checklist: Completed Consent: Before the procedure and under the influence of no sedative(s), amnesic(s), or anxiolytics, the patient was informed of the treatment options, risks and possible complications. To fulfill our ethical and legal obligations, as recommended by the American Medical Association's  Code of Ethics, I have informed the patient of my clinical impression; the nature and purpose of the treatment or procedure; the risks, benefits, and possible complications of the intervention; the alternatives, including doing nothing; the risk(s) and benefit(s) of the alternative treatment(s) or procedure(s); and the risk(s) and benefit(s) of doing nothing. The patient was provided information about the general risks and possible complications associated with the procedure. These may include, but are not limited to: failure to achieve desired goals, infection, bleeding, organ or nerve damage, allergic reactions, paralysis, and death. In addition,  the patient was informed of those risks and complications associated to the procedure, such as failure to decrease pain; infection; bleeding; organ or nerve damage with subsequent damage to sensory, motor, and/or autonomic systems, resulting in permanent pain, numbness, and/or weakness of one or several areas of the body; allergic reactions; (i.e.: anaphylactic reaction); and/or death. Furthermore, the patient was informed of those risks and complications associated with the medications. These include, but are not limited to: allergic reactions (i.e.: anaphylactic or anaphylactoid reaction(s)); adrenal axis suppression; blood sugar elevation that in diabetics may result in ketoacidosis or comma; water retention that in patients with history of congestive heart failure may result in shortness of breath, pulmonary edema, and decompensation with resultant heart failure; weight gain; swelling or edema; medication-induced neural toxicity; particulate matter embolism and blood vessel occlusion with resultant organ, and/or nervous system infarction; and/or aseptic necrosis of one or more joints. Finally, the patient was informed that Medicine is not an exact science; therefore, there is also the possibility of unforeseen or unpredictable risks and/or possible complications  that may result in a catastrophic outcome. The patient indicated having understood very clearly. We have given the patient no guarantees and we have made no promises. Enough time was given to the patient to ask questions, all of which were answered to the patient's satisfaction. Ms. Crass has indicated that she wanted to continue with the procedure. Attestation: I, the ordering provider, attest that I have discussed with the patient the benefits, risks, side-effects, alternatives, likelihood of achieving goals, and potential problems during recovery for the procedure that I have provided informed consent. Date: 05/31/2017; Time: 2:10 PM  Pre-Procedure Preparation:  Monitoring: As per clinic protocol. Respiration, ETCO2, SpO2, BP, heart rate and rhythm monitor placed and checked for adequate function Safety Precautions: Patient was assessed for positional comfort and pressure points before starting the procedure. Time-out: I initiated and conducted the "Time-out" before starting the procedure, as per protocol. The patient was asked to participate by confirming the accuracy of the "Time Out" information. Verification of the correct person, site, and procedure were performed and confirmed by me, the nursing staff, and the patient. "Time-out" conducted as per Joint Commission's Universal Protocol (UP.01.01.01). "Time-out" Date & Time: 05/31/2017; 1441 hrs.  Description of Procedure Process:   Position: Supine Target Area: Acromio-clavicular Joint Approach: Entire approach. Area Prepped: Entire shoulder Area Prepping solution: ChloraPrep (2% chlorhexidine gluconate and 70% isopropyl alcohol) Safety Precautions: Aspiration looking for blood return was conducted prior to all injections. At no point did we inject any substances, as a needle was being advanced. No attempts were made at seeking any paresthesias. Safe injection practices and needle disposal techniques used. Medications properly checked for  expiration dates. SDV (single dose vial) medications used. Description of the Procedure: Protocol guidelines were followed. The patient was placed in position over the procedure table. The target area was identified and the area prepped in the usual manner. Skin & deeper tissues infiltrated with local anesthetic. Appropriate amount of time allowed to pass for local anesthetics to take effect. The procedure needles were then advanced to the target area. Proper needle placement secured. Negative aspiration confirmed. Solution injected in intermittent fashion, asking for systemic symptoms every 0.5cc of injectate. The needles were then removed and the area cleansed, making sure to leave some of the prepping solution back to take advantage of its long term bactericidal properties. Vitals:   05/31/17 1447 05/31/17 1453 05/31/17 1502 05/31/17 1511  BP: (!) 158/94 (!) 130/92 (!) 143/105 Marland Kitchen)  133/55  Pulse:      Resp: (!) 24 (!) 22 19 (!) 25  Temp:  98.1 F (36.7 C)  98.3 F (36.8 C)  TempSrc:      SpO2: 94% 97% 97% 100%  Weight:      Height:        Start Time: 1442 hrs. End Time: 1447 hrs. Materials:  Needle(s) Type: Regular needle Gauge: 22G Length: 3.5-in Medication(s): We administered lactated ringers, lidocaine, methylPREDNISolone acetate, and ropivacaine (PF) 2 mg/mL (0.2%). Please see chart orders for dosing details.  Imaging Guidance (Non-Spinal):  Type of Imaging Technique: Fluoroscopy Guidance (Non-Spinal) Indication(s): Assistance in needle guidance and placement for procedures requiring needle placement in or near specific anatomical locations not easily accessible without such assistance. Exposure Time: Please see nurses notes. Contrast: None used. Fluoroscopic Guidance: I was personally present during the use of fluoroscopy. "Tunnel Vision Technique" used to obtain the best possible view of the target area. Parallax error corrected before commencing the procedure.  "Direction-depth-direction" technique used to introduce the needle under continuous pulsed fluoroscopy. Once target was reached, antero-posterior, oblique, and lateral fluoroscopic projection used confirm needle placement in all planes. Images permanently stored in EMR. Interpretation: No contrast injected. I personally interpreted the imaging intraoperatively. Adequate needle placement confirmed in multiple planes. Permanent images saved into the patient's record.  Antibiotic Prophylaxis:  Indication(s): None identified Antibiotic given: None  Post-operative Assessment:  EBL: None Complications: No immediate post-treatment complications observed by team, or reported by patient. Note: The patient tolerated the entire procedure well. A repeat set of vitals were taken after the procedure and the patient was kept under observation following institutional policy, for this type of procedure. Post-procedural neurological assessment was performed, showing return to baseline, prior to discharge. The patient was provided with post-procedure discharge instructions, including a section on how to identify potential problems. Should any problems arise concerning this procedure, the patient was given instructions to immediately contact us, at any time, without hesitation. In any case, we plan to contact the patient by telephone for a follow-up status report regarding this interventional procedure. Comments:  No additional relevant information.  Plan of Care  Disposition: Discharge home  Discharge Date & Time: 05/31/2017; 1512 hrs.   Physician-requested Follow-up:  Return for post-procedure eval by Crystal, in 2 weeks.  Future Appointments Date Time Provider [place]  06/14/2017 11:30 AM Vevelyn Francois, NP [facility] None  08/16/2017 11:00 AM Vevelyn Francois, NP [facility] None   Possible POC:  If the intra-articular shoulder joint injections as provided the patient with good relief of the pain for  the duration of local anesthetic, but does not provide her with any long-term benefit, then we'll consider doing a diagnostic left sided suprascapular nerve block under fluoroscopic guidance. Should this provide the patient with good benefit, we'll then we will consider suprascapular nerve radiofrequency.     Imaging Orders     DG C-Arm 1-60 Min-No Report  Procedure Orders     SHOULDER INJECTION  Medications ordered for procedure: Meds ordered this encounter  Medications  . lactated ringers infusion 1,000 mL  . midazolam (VERSED) 5 MG/5ML injection 1-2 mg    Make sure Flumazenil is available in the pyxis when using this medication. If oversedation occurs, administer 0.2 mg IV over 15 sec. If after 45 sec no response, administer 0.2 mg again over 1 min; may repeat at 1 min intervals; not to exceed 4 doses (1 mg)  . fentaNYL (SUBLIMAZE) injection 25-50 mcg  Make sure Narcan is available in the pyxis when using this medication. In the event of respiratory depression (RR< 8/min): Titrate NARCAN (naloxone) in increments of 0.1 to 0.2 mg IV at 2-3 minute intervals, until desired degree of reversal.  . lidocaine (XYLOCAINE) 2 % (with pres) injection 400 mg  . methylPREDNISolone acetate (DEPO-MEDROL) injection 80 mg  . ropivacaine (PF) 2 mg/mL (0.2%) (NAROPIN) injection 4 mL   Medications administered: We administered lactated ringers, lidocaine, methylPREDNISolone acetate, and ropivacaine (PF) 2 mg/mL (0.2%).  See the medical record for exact dosing, route, and time of administration.  New Prescriptions   No medications on file   Primary Care Physician: Idelle Crouch, MD Location: [facility] Note by: Gaspar Cola, MD Date: 05/31/2017; Time: 4:20 PM  Disclaimer:  Medicine is not an Chief Strategy Officer. The only guarantee in medicine is that nothing is guaranteed. It is important to note that the decision to proceed with this intervention was based on the  information collected from the patient. The Data and conclusions were drawn from the patient's questionnaire, the interview, and the physical examination. Because the information was provided in large part by the patient, it cannot be guaranteed that it has not been purposely or unconsciously manipulated. Every effort has been made to obtain as much relevant data as possible for this evaluation. It is important to note that the conclusions that lead to this procedure are derived in large part from the available data. Always take into account that the treatment will also be dependent on availability of resources and existing treatment guidelines, considered by other Pain Management Practitioners as being common knowledge and practice, at the time of the intervention. For Medico-Legal purposes, it is also important to point out that variation in procedural techniques and pharmacological choices are the acceptable norm. The indications, contraindications, technique, and results of the above procedure should only be interpreted and judged by a Board-Certified Interventional Pain Specialist with extensive familiarity and expertise in the same exact procedure and technique.

## 2017-06-01 ENCOUNTER — Telehealth: Payer: Self-pay | Admitting: *Deleted

## 2017-06-01 NOTE — Telephone Encounter (Signed)
Spoke with patient re; procedure on yestdra

## 2017-06-03 DIAGNOSIS — K219 Gastro-esophageal reflux disease without esophagitis: Secondary | ICD-10-CM | POA: Diagnosis not present

## 2017-06-03 DIAGNOSIS — E119 Type 2 diabetes mellitus without complications: Secondary | ICD-10-CM | POA: Diagnosis not present

## 2017-06-03 DIAGNOSIS — D649 Anemia, unspecified: Secondary | ICD-10-CM | POA: Diagnosis not present

## 2017-06-03 DIAGNOSIS — I10 Essential (primary) hypertension: Secondary | ICD-10-CM | POA: Diagnosis not present

## 2017-06-03 DIAGNOSIS — G4730 Sleep apnea, unspecified: Secondary | ICD-10-CM | POA: Diagnosis not present

## 2017-06-03 DIAGNOSIS — Z9884 Bariatric surgery status: Secondary | ICD-10-CM | POA: Diagnosis not present

## 2017-06-03 DIAGNOSIS — K449 Diaphragmatic hernia without obstruction or gangrene: Secondary | ICD-10-CM | POA: Diagnosis not present

## 2017-06-03 DIAGNOSIS — Z6841 Body Mass Index (BMI) 40.0 and over, adult: Secondary | ICD-10-CM | POA: Diagnosis not present

## 2017-06-03 DIAGNOSIS — K420 Umbilical hernia with obstruction, without gangrene: Secondary | ICD-10-CM | POA: Diagnosis not present

## 2017-06-03 HISTORY — PX: ROUX-EN-Y GASTRIC BYPASS: SHX1104

## 2017-06-14 ENCOUNTER — Ambulatory Visit: Payer: Medicare HMO | Admitting: Nurse Practitioner

## 2017-06-20 DIAGNOSIS — Z713 Dietary counseling and surveillance: Secondary | ICD-10-CM | POA: Diagnosis not present

## 2017-06-20 DIAGNOSIS — Z9884 Bariatric surgery status: Secondary | ICD-10-CM | POA: Diagnosis not present

## 2017-06-20 DIAGNOSIS — E1140 Type 2 diabetes mellitus with diabetic neuropathy, unspecified: Secondary | ICD-10-CM | POA: Diagnosis not present

## 2017-06-20 DIAGNOSIS — K219 Gastro-esophageal reflux disease without esophagitis: Secondary | ICD-10-CM | POA: Diagnosis not present

## 2017-06-20 DIAGNOSIS — I10 Essential (primary) hypertension: Secondary | ICD-10-CM | POA: Diagnosis not present

## 2017-06-21 ENCOUNTER — Ambulatory Visit (INDEPENDENT_AMBULATORY_CARE_PROVIDER_SITE_OTHER): Payer: Medicare HMO

## 2017-06-21 VITALS — BP 146/85 | HR 90 | Ht 64.0 in | Wt 389.0 lb

## 2017-06-21 DIAGNOSIS — N390 Urinary tract infection, site not specified: Secondary | ICD-10-CM | POA: Diagnosis not present

## 2017-06-21 LAB — MICROSCOPIC EXAMINATION
Epithelial Cells (non renal): NONE SEEN /hpf (ref 0–10)
RBC MICROSCOPIC, UA: NONE SEEN /HPF (ref 0–?)

## 2017-06-21 LAB — URINALYSIS, COMPLETE
Bilirubin, UA: NEGATIVE
GLUCOSE, UA: NEGATIVE
NITRITE UA: POSITIVE — AB
RBC, UA: NEGATIVE
Specific Gravity, UA: 1.015 (ref 1.005–1.030)
UUROB: 0.2 mg/dL (ref 0.2–1.0)
pH, UA: 6 (ref 5.0–7.5)

## 2017-06-21 NOTE — Progress Notes (Signed)
Pt presents today with c/o hard to postpone urination, back pain, lower abd pain, nausea, and low grade fever of 100. Pt stated that she had gastric bypass surgery 06/03/17 and at the time of surgery she was given 3 rounds of ancef. Pt stated that she has not been taking her suppressive abx. A CATH specimen was obtained for u/a and cx.  Blood pressure (!) 146/85, pulse 90, height 5\' 4"  (1.626 m), weight (!) 389 lb (176.4 kg).

## 2017-06-24 ENCOUNTER — Telehealth: Payer: Self-pay

## 2017-06-24 DIAGNOSIS — N390 Urinary tract infection, site not specified: Secondary | ICD-10-CM

## 2017-06-24 LAB — CULTURE, URINE COMPREHENSIVE

## 2017-06-24 MED ORDER — LEVOFLOXACIN 500 MG PO TABS: 500.0000 mg | ORAL_TABLET | Freq: Every day | ORAL | 0 refills | Status: DC

## 2017-06-24 NOTE — Telephone Encounter (Signed)
Spoke with pt in reference to ucx results and taking levoquin. Pt stated that she can take levoquin. Medication was sent to pharmacy. Pt voiced understanding.

## 2017-06-24 NOTE — Telephone Encounter (Signed)
-----   Message from Nori Riis, PA-C sent at 06/24/2017  2:44 PM EDT ----- Please let Genny know that her culture is positive.  Can she tolerate Levaquin?  She has Cipro listed as an allergy.  If she can, we can call in Levaquin 500 mg, qd x 7.  If she cannot tolerate the Levaquin, we will need to refer her to ID.

## 2017-06-29 ENCOUNTER — Ambulatory Visit: Payer: Medicare HMO | Attending: Nurse Practitioner | Admitting: Nurse Practitioner

## 2017-06-29 ENCOUNTER — Encounter: Payer: Self-pay | Admitting: Nurse Practitioner

## 2017-06-29 VITALS — BP 122/69 | HR 90 | Temp 99.1°F | Resp 20 | Ht 64.0 in | Wt 394.0 lb

## 2017-06-29 DIAGNOSIS — E282 Polycystic ovarian syndrome: Secondary | ICD-10-CM | POA: Insufficient documentation

## 2017-06-29 DIAGNOSIS — M479 Spondylosis, unspecified: Secondary | ICD-10-CM | POA: Insufficient documentation

## 2017-06-29 DIAGNOSIS — Z96642 Presence of left artificial hip joint: Secondary | ICD-10-CM | POA: Insufficient documentation

## 2017-06-29 DIAGNOSIS — F1120 Opioid dependence, uncomplicated: Secondary | ICD-10-CM | POA: Insufficient documentation

## 2017-06-29 DIAGNOSIS — G4733 Obstructive sleep apnea (adult) (pediatric): Secondary | ICD-10-CM | POA: Insufficient documentation

## 2017-06-29 DIAGNOSIS — G8929 Other chronic pain: Secondary | ICD-10-CM | POA: Diagnosis not present

## 2017-06-29 DIAGNOSIS — M25512 Pain in left shoulder: Secondary | ICD-10-CM | POA: Diagnosis not present

## 2017-06-29 DIAGNOSIS — M19012 Primary osteoarthritis, left shoulder: Secondary | ICD-10-CM | POA: Insufficient documentation

## 2017-06-29 DIAGNOSIS — E785 Hyperlipidemia, unspecified: Secondary | ICD-10-CM | POA: Insufficient documentation

## 2017-06-29 DIAGNOSIS — M25561 Pain in right knee: Secondary | ICD-10-CM | POA: Diagnosis not present

## 2017-06-29 DIAGNOSIS — R1310 Dysphagia, unspecified: Secondary | ICD-10-CM | POA: Diagnosis not present

## 2017-06-29 DIAGNOSIS — G4700 Insomnia, unspecified: Secondary | ICD-10-CM | POA: Diagnosis not present

## 2017-06-29 DIAGNOSIS — M25562 Pain in left knee: Secondary | ICD-10-CM | POA: Diagnosis not present

## 2017-06-29 DIAGNOSIS — E042 Nontoxic multinodular goiter: Secondary | ICD-10-CM | POA: Insufficient documentation

## 2017-06-29 DIAGNOSIS — K219 Gastro-esophageal reflux disease without esophagitis: Secondary | ICD-10-CM | POA: Insufficient documentation

## 2017-06-29 DIAGNOSIS — R079 Chest pain, unspecified: Secondary | ICD-10-CM | POA: Insufficient documentation

## 2017-06-29 DIAGNOSIS — I10 Essential (primary) hypertension: Secondary | ICD-10-CM | POA: Insufficient documentation

## 2017-06-29 DIAGNOSIS — Z886 Allergy status to analgesic agent status: Secondary | ICD-10-CM | POA: Insufficient documentation

## 2017-06-29 DIAGNOSIS — Z79891 Long term (current) use of opiate analgesic: Secondary | ICD-10-CM | POA: Insufficient documentation

## 2017-06-29 DIAGNOSIS — F331 Major depressive disorder, recurrent, moderate: Secondary | ICD-10-CM | POA: Insufficient documentation

## 2017-06-29 DIAGNOSIS — Z881 Allergy status to other antibiotic agents status: Secondary | ICD-10-CM | POA: Insufficient documentation

## 2017-06-29 DIAGNOSIS — M545 Low back pain: Secondary | ICD-10-CM | POA: Diagnosis not present

## 2017-06-29 DIAGNOSIS — J45909 Unspecified asthma, uncomplicated: Secondary | ICD-10-CM | POA: Insufficient documentation

## 2017-06-29 DIAGNOSIS — E559 Vitamin D deficiency, unspecified: Secondary | ICD-10-CM | POA: Diagnosis not present

## 2017-06-29 DIAGNOSIS — M25551 Pain in right hip: Secondary | ICD-10-CM

## 2017-06-29 DIAGNOSIS — M47816 Spondylosis without myelopathy or radiculopathy, lumbar region: Secondary | ICD-10-CM | POA: Insufficient documentation

## 2017-06-29 DIAGNOSIS — Z8614 Personal history of Methicillin resistant Staphylococcus aureus infection: Secondary | ICD-10-CM | POA: Insufficient documentation

## 2017-06-29 DIAGNOSIS — Z794 Long term (current) use of insulin: Secondary | ICD-10-CM | POA: Insufficient documentation

## 2017-06-29 DIAGNOSIS — M109 Gout, unspecified: Secondary | ICD-10-CM | POA: Diagnosis not present

## 2017-06-29 DIAGNOSIS — I249 Acute ischemic heart disease, unspecified: Secondary | ICD-10-CM | POA: Insufficient documentation

## 2017-06-29 DIAGNOSIS — E1142 Type 2 diabetes mellitus with diabetic polyneuropathy: Secondary | ICD-10-CM | POA: Diagnosis not present

## 2017-06-29 DIAGNOSIS — Z79899 Other long term (current) drug therapy: Secondary | ICD-10-CM | POA: Diagnosis not present

## 2017-06-29 DIAGNOSIS — M170 Bilateral primary osteoarthritis of knee: Secondary | ICD-10-CM | POA: Insufficient documentation

## 2017-06-29 NOTE — Progress Notes (Addendum)
Patient's Name: Karen Lewis  MRN: [id number]  Referring Provider: Idelle Crouch, MD  DOB: [birth year]  PCP: Idelle Crouch, MD  DOS: 06/29/2017  Note by: Vevelyn Francois NP  Service setting: Ambulatory outpatient  Specialty: Interventional Pain Management  Location: [facility] (AMB) Pain Management Facility    Patient type: Established    Primary Reason(s) for Visit: Encounter for prescription drug management & post-procedure evaluation of chronic illness with mild to moderate exacerbation(Level of risk: moderate) CC: Hip Pain (right)  HPI  Karen Lewis is a 46 y.o. year old, female patient, who comes today for a post-procedure evaluation and medication management. She has History of asthma; Binge eating disorder; Fibromyalgia; Anxiety, generalized; Insomnia, persistent; Depression, major, recurrent, moderate ([place]); Uncomplicated asthma; Eczema; Gout; Hyperlipidemia; Headache, migraine; Obstructive apnea; Bilateral polycystic ovarian syndrome; Apnea, sleep; Disease of thyroid gland; Avitaminosis D; History of surgical procedure; Type 2 diabetes mellitus ([place]); Long term current use of opiate analgesic; Long term prescription opiate use; Opiate use; Encounter for therapeutic drug level monitoring; Opiate dependence ([place]); Essential (primary) hypertension; Goiter, nontoxic, multinodular; Chronic knee pain (Location of Primary Source of Pain) (Bilateral) (R>L); Chronic low back pain (Location of Tertiary source of pain) (Bilateral) (R>L); Lumbar facet syndrome (Bilateral) (R>L); Osteoarthritis; Grade 1 (1.4 cm) Anterolisthesis of L4 over L5; Chronic hip pain (Location of Secondary source of pain) (Right); S/P THR (total hip replacement) (Left); History of methicillin resistant staphylococcus aureus (MRSA); History of bariatric surgery; Hypomagnesemia; Morbid obesity with BMI of 70 and over, adult ([place]) (71.75 on 08/27/2015); History of methicillin resistant Staphylococcus aureus infection; Lumbar spondylosis;  Vitamin D insufficiency; Dysphagia, unspecified; Gastroesophageal reflux disease without esophagitis; Osteoarthritis of hip (Right); Chest pain with low risk of acute coronary syndrome; Nontoxic goiter; Other psychoactive substance dependence, uncomplicated (HCC); SOB (shortness of breath) on exertion; Chronic pain syndrome; Osteoarthritis of knee (Bilateral) (R>L); Neurogenic pain; Upper extremity pain; Chronic shoulder pain (Left); and Osteoarthritis of shoulder (Left) on her problem list. Her primarily concern today is the Hip Pain (right)  Pain Assessment: Location: Right Hip Radiating: radiates down right leg to knee  Onset: More than a month ago Duration: Chronic pain Quality: Aching, Sharp Severity: 7 /10 (self-reported pain score)  Note: Reported level is compatible with observation. Clinically the patient looks like a 2/10 Information on the proper use of the pain scale provided to the patient today. When using our objective Pain Scale, levels between 6 and 10/10 are said to belong in an emergency room, as it progressively worsens from a 6/10, described as severely limiting, requiring emergency care not usually available at an outpatient pain management facility. At a 6/10 level, communication becomes difficult and requires great effort. Assistance to reach the emergency department may be required. Facial flushing and profuse sweating along with potentially dangerous increases in heart rate and blood pressure will be evident. Effect on ADL:   Timing: Constant Modifying factors: movement, heat  Karen Lewis was last seen on 05/18/2017 for a procedure. During today's appointment we reviewed Karen Lewis's post-procedure results, as well as her outpatient medication regimen. She had Gastric Bypass surgery on 06/02/17. She is status post Left hip surgery. She into now complaining that the right is getting worse. She feels like this has increased since her surgery. She has limited ROM and weakness.    Further details on both, my assessment(s), as well as the proposed treatment plan, please see below.  Post-Procedure Assessment  05/25/2017 Procedure: Left shoulder injection Pre-procedure pain score:  4/10  Post-procedure pain score: 0/10         Influential Factors: BMI: 67.63 kg/m Intra-procedural challenges: None observed.         Assessment challenges: None detected.              Reported side-effects: None.        Post-procedural adverse reactions or complications: None reported         Sedation: Please see nurses note. When no sedatives are used, the analgesic levels obtained are directly associated to the effectiveness of the local anesthetics. However, when sedation is provided, the level of analgesia obtained during the initial 1 hour following the intervention, is believed to be the result of a combination of factors. These factors may include, but are not limited to: 1. The effectiveness of the local anesthetics used. 2. The effects of the analgesic(s) and/or anxiolytic(s) used. 3. The degree of discomfort experienced by the patient at the time of the procedure. 4. The patients ability and reliability in recalling and recording the events. 5. The presence and influence of possible secondary gains and/or psychosocial factors. Reported result: Relief experienced during the 1st hour after the procedure: 50 % (Ultra-Short Term Relief)            Interpretative annotation: Clinically appropriate result. Analgesia during this period is likely to be Local Anesthetic and/or IV Sedative (Analgesic/Anxiolytic) related.          Effects of local anesthetic: The analgesic effects attained during this period are directly associated to the localized infiltration of local anesthetics and therefore cary significant diagnostic value as to the etiological location, or anatomical origin, of the pain. Expected duration of relief is directly dependent on the pharmacodynamics of the local anesthetic  used. Long-acting (4-6 hours) anesthetics used.  Reported result: Relief during the next 4 to 6 hour after the procedure: 0 % (Short-Term Relief)            Interpretative annotation: Clinically appropriate result. Analgesia during this period is likely to be Local Anesthetic-related.          Long-term benefit: Defined as the period of time past the expected duration of local anesthetics (1 hour for short-acting and 4-6 hours for long-acting). With the possible exception of prolonged sympathetic blockade from the local anesthetics, benefits during this period are typically attributed to, or associated with, other factors such as analgesic sensory neuropraxia, antiinflammatory effects, or beneficial biochemical changes provided by agents other than the local anesthetics.  Reported result: Extended relief following procedure: 100 % (Long-Term Relief)            Interpretative annotation: Clinically appropriate result. Good relief. No permanent benefit expected. Inflammation plays a part in the etiology to the pain.          Current benefits: Defined as reported results that persistent at this point in time.   Analgesia: 100 % Karen Lewis reports improvement of arthralgia. Function: Somewhat improved ROM: Somewhat improved Interpretative annotation: Recurrence of symptoms. Therapeutic success. Effective diagnostic intervention.          Interpretation: Results would suggest a successful diagnostic intervention.                  Plan:  Please see "Plan of Care" for details.       Laboratory Chemistry  Inflammation Markers (CRP: Acute Phase) (ESR: Chronic Phase) Lab Results  Component Value Date   CRP <0.5 11/24/2015   ESRSEDRATE 36 (H) 11/24/2015  Renal Function Markers Lab Results  Component Value Date   BUN 8 11/24/2015   CREATININE 0.80 05/11/2016   GFRAA >60 11/24/2015   GFRNONAA >60 11/24/2015                 Hepatic Function Markers Lab Results  Component Value  Date   AST 23 11/24/2015   ALT 20 11/24/2015   ALBUMIN 3.7 11/24/2015   ALKPHOS 81 11/24/2015                 Electrolytes Lab Results  Component Value Date   NA 139 11/24/2015   K 4.3 11/24/2015   CL 105 11/24/2015   CALCIUM 8.6 (L) 11/24/2015   MG 1.6 (L) 11/24/2015                 Neuropathy Markers Lab Results  Component Value Date   VITAMINB12 303 11/24/2015                 Bone Pathology Markers Lab Results  Component Value Date   ALKPHOS 81 11/24/2015   VD25OH 27.4 (L) 11/24/2015   VD125OH2TOT 41.5 11/24/2015   CALCIUM 8.6 (L) 11/24/2015                 Coagulation Parameters Lab Results  Component Value Date   INR 0.95 05/07/2015   LABPROT 12.9 05/07/2015   APTT 26 05/07/2015   PLT 337 11/12/2015                 Cardiovascular Markers Lab Results  Component Value Date   HGB 12.4 11/12/2015   HCT 38.0 11/12/2015                 Note: Lab results reviewed.  Recent Diagnostic Imaging Results  DG C-Arm 1-60 Min-No Report Fluoroscopy was utilized by the requesting physician.  No radiographic  interpretation.   Complexity Note: Imaging results reviewed. Results shared with Karen Lewis, using Layman's terms.                         Meds   Current Outpatient Prescriptions:  .  Acetaminophen 500 MG coapsule, Take 500 mg by mouth every 6 (six) hours as needed. , Disp: , Rfl:  .  acyclovir (ZOVIRAX) 800 MG tablet, Take 800 mg by mouth 3 times/day as needed-between meals & bedtime. Reported on 11/12/2015, Disp: , Rfl:  .  albuterol (PROVENTIL HFA;VENTOLIN HFA) 108 (90 BASE) MCG/ACT inhaler, Inhale 2 puffs into the lungs every 6 (six) hours as needed for wheezing or shortness of breath., Disp: , Rfl:  .  amitriptyline (ELAVIL) 100 MG tablet, Take 100 mg by mouth at bedtime. , Disp: , Rfl:  .  Blood Glucose Calibration (ACCU-CHEK AVIVA) SOLN, , Disp: , Rfl:  .  Blood Glucose Monitoring Suppl (ACCU-CHEK AVIVA PLUS) w/Device KIT, , Disp: , Rfl:  .   butalbital-acetaminophen-caffeine (FIORICET, ESGIC) 50-325-40 MG tablet, 1 tablet every 6 (six) hours as needed. , Disp: , Rfl:  .  ferrous sulfate 325 (65 FE) MG tablet, Take 325 mg by mouth daily with breakfast., Disp: , Rfl:  .  gabapentin (NEURONTIN) 800 MG tablet, Take 1 tablet (800 mg total) by mouth every 6 (six) hours. (Patient taking differently: Take 1,600 mg by mouth 2 (two) times daily. ), Disp: 360 tablet, Rfl: 0 .  guaiFENesin-codeine 100-10 MG/5ML syrup, Take by mouth., Disp: , Rfl:  .  Insulin Pen Needle (FIFTY50 PEN NEEDLES) 31G X  8 MM MISC, Use once daily., Disp: , Rfl:  .  ipratropium-albuterol (DUONEB) 0.5-2.5 (3) MG/3ML SOLN, 3 mLs as needed. , Disp: , Rfl:  .  ketoconazole (NIZORAL) 2 % cream, APPLY AS DIRECTED TO AFFECTED AREA TWICE DAILY AS NEEDED, Disp: , Rfl:  .  Lancet Devices (SIMPLE DIAGNOSTICS LANCING DEV) MISC, Use 1 each as directed. Accu Chek Softclix. DX: E11.8, Disp: , Rfl:  .  levocetirizine (XYZAL) 5 MG tablet, Take 5 mg by mouth every evening. , Disp: , Rfl:  .  levothyroxine (SYNTHROID, LEVOTHROID) 200 MCG tablet, Take on an empty stomach each morning before breakfast. Total dose 250 mcg daily., Disp: , Rfl:  .  levothyroxine (SYNTHROID, LEVOTHROID) 50 MCG tablet, Take 50 mcg by mouth daily before breakfast. , Disp: , Rfl:  .  magnesium oxide (MAG-OX) 400 MG tablet, Take 1 tablet (400 mg total) by mouth 2 (two) times daily., Disp: 60 tablet, Rfl: 2 .  meclizine (ANTIVERT) 25 MG tablet, Take 1 tablet (25 mg total) by mouth 3 (three) times daily as needed for dizziness., Disp: 30 tablet, Rfl: 0 .  medroxyPROGESTERone (DEPO-PROVERA) 150 MG/ML injection, Inject 150 mg into the muscle every 3 (three) months. , Disp: , Rfl:  .  metFORMIN (GLUCOPHAGE) 1000 MG tablet, Take 1,000 mg by mouth 2 (two) times daily with a meal. , Disp: , Rfl:  .  metoprolol (LOPRESSOR) 50 MG tablet, Take 75 mg by mouth 2 (two) times daily. , Disp: , Rfl:  .  montelukast (SINGULAIR) 10 MG  tablet, Take 10 mg by mouth at bedtime. , Disp: , Rfl:  .  Multiple Vitamin (MULTIVITAMIN WITH MINERALS) TABS tablet, Take 2 tablets by mouth daily. Reported on 11/12/2015, Disp: , Rfl:  .  naloxone (NARCAN) nasal spray 4 mg/0.1 mL, Spray into one nostril. Repeat with second device into other nostril after 2-3 minutes if no or minimal response., Disp: 1 kit, Rfl: 0 .  omeprazole (PRILOSEC) 40 MG capsule, Take 40 mg by mouth 2 (two) times daily. , Disp: , Rfl:  .  pregabalin (LYRICA) 100 MG capsule, Take 1 capsule (100 mg total) by mouth 3 (three) times daily., Disp: 270 capsule, Rfl: 0 .  QUEtiapine (SEROQUEL) 100 MG tablet, Take 1 tablet (100 mg total) by mouth at bedtime., Disp: 90 tablet, Rfl: 0 .  ranitidine (ZANTAC) 150 MG capsule, Take by mouth., Disp: , Rfl:  .  rosuvastatin (CRESTOR) 40 MG tablet, Take 40 mg by mouth daily. , Disp: , Rfl:  .  sertraline (ZOLOFT) 100 MG tablet, Take 2 tablets (200 mg total) by mouth daily., Disp: 180 tablet, Rfl: 2 .  spironolactone (ALDACTONE) 25 MG tablet, Take 37.5 mg by mouth 2 (two) times daily. , Disp: , Rfl:  .  tiZANidine (ZANAFLEX) 4 MG tablet, 4 mg at bedtime. , Disp: , Rfl:  .  triamcinolone (KENALOG) 0.1 % paste, Apply 1 application topically 2 (two) times daily. , Disp: , Rfl:  .  triamcinolone (NASACORT AQ) 55 MCG/ACT AERO nasal inhaler, 2 sprays by Nasal route 2 (two) times daily., Disp: , Rfl:  .  TRUE METRIX BLOOD GLUCOSE TEST test strip, , Disp: , Rfl:  .  ULTICARE ALCOHOL SWABS PADS, Apply 1 each topically 2 (two) times daily. Use when checking BG. DX: E11.8, Disp: , Rfl:  .  vitamin B-12 (CYANOCOBALAMIN) 1000 MCG tablet, Take by mouth., Disp: , Rfl:  .  Vitamin D, Ergocalciferol, (DRISDOL) 50000 units CAPS capsule, Take 1 capsule (50,000 Units  total) by mouth 2 (two) times a week. X 6 weeks., Disp: 12 capsule, Rfl: 0 .  zonisamide (ZONEGRAN) 50 MG capsule, Take 150 mg by mouth daily. 3 tabs at bedtime, Disp: , Rfl:  .  oxyCODONE (OXY  IR/ROXICODONE) 5 MG immediate release tablet, Take 1 tablet (5 mg total) by mouth 5 (five) times daily as needed for severe pain., Disp: 150 tablet, Rfl: 0 .  oxyCODONE (OXY IR/ROXICODONE) 5 MG immediate release tablet, Take 1 tablet (5 mg total) by mouth 5 (five) times daily as needed for severe pain., Disp: 150 tablet, Rfl: 0 .  oxyCODONE (OXY IR/ROXICODONE) 5 MG immediate release tablet, Take 1 tablet (5 mg total) by mouth 5 (five) times daily as needed for severe pain., Disp: 150 tablet, Rfl: 0 .  phentermine 15 MG capsule, Take 37.5 mg by mouth. , Disp: , Rfl:  .  ranitidine (ZANTAC) 150 MG capsule, Take 150 mg by mouth every evening. , Disp: , Rfl:   ROS  Constitutional: Denies any fever or chills Gastrointestinal: No reported hemesis, hematochezia, vomiting, or acute GI distress Musculoskeletal: Denies any acute onset joint swelling, redness, loss of ROM, or weakness Neurological: No reported episodes of acute onset apraxia, aphasia, dysarthria, agnosia, amnesia, paralysis, loss of coordination, or loss of consciousness  Allergies  Karen Lewis is allergic to bactrim [sulfamethoxazole-trimethoprim]; xolair [omalizumab]; ciprofloxacin; shellfish allergy; aspirin; clindamycin; motrin [ibuprofen]; nsaids; other; and sulfa antibiotics.  [place]  Drug: Karen Lewis  reports that she does not use drugs. Alcohol:  reports that she does not drink alcohol. Tobacco:  reports that she has never smoked. She has never used smokeless tobacco. Medical:  has a past medical history of Anemia; Anginal pain ([place]); Anxiety; Arthralgia of hip (07/29/2015); Arthritis; Arthritis, degenerative (07/29/2015); Asthma; Cephalalgia (07/25/2014); Dependence on unknown drug ([place]); Depression; Diabetes mellitus without complication ([place]); Dysrhythmia; Eczema; Fibromyalgia; Gastritis; GERD (gastroesophageal reflux disease); Gonalgia (07/29/2015); Gout; H/O cardiovascular disorder (03/10/2015); H/O surgical procedure (12/05/2012); H/O  thyroid disease (03/10/2015); Headache; Herpes; History of artificial joint (07/29/2015); History of hiatal hernia; Hypertension; Hypomagnesemia; Hypothyroidism; LBP (low back pain) (07/29/2015); Neuromuscular disorder ([place]); Obesity; PCOS (polycystic ovarian syndrome); Primary osteoarthritis of both knees (07/29/2015); Sleep apnea; and Thyroid nodule (bilateral). Surgical: Karen Lewis  has a past surgical history that includes Laparoscopic partial gastrectomy; Shoulder arthroscopy (Right); Joint replacement (Left, hip); Carpal tunnel release (Bilateral); Diagnostic laparoscopy; Cholecystectomy; Trigger finger release (Right); Thyroidectomy (N/A, 11/12/2015); left trigger finger; Roux-en-Y Gastric Bypass (06/03/2017); and Hiatal hernia repair. Family: family history includes Alcohol abuse in her father and mother; Anxiety disorder in her father and mother; Breast cancer in her paternal aunt; COPD in her father; Depression in her brother, father, and mother; Diabetes in her brother, father, and mother; Hypertension in her brother, father, and mother; Kidney cancer in her mother; Kidney failure in her father; Post-traumatic stress disorder in her father; Sleep apnea in her brother, father, and mother.  Constitutional Exam  General appearance: alert, cooperative and morbidly obese Vitals:   06/29/17 1038  BP: 122/69  Lewis: 90  Resp: 20  Temp: 99.1 F (37.3 C)  SpO2: 100%  Weight: (!) 394 lb (178.7 kg)  Height: '5\' 4"'  (1.626 m)   BMI Assessment: Estimated body mass index is 67.63 kg/m as calculated from the following:   Height as of this encounter: '5\' 4"'  (1.626 m).   Weight as of this encounter: 394 lb (178.7 kg).   Psych/Mental status: Alert, oriented x 3 (person, place, & time)  Eyes: PERLA Respiratory: No evidence of acute respiratory distress  Cervical Spine Area Exam  Skin & Axial Inspection: No masses, redness, edema, swelling, or associated skin lesions Alignment:  Symmetrical Functional ROM: Unrestricted ROM      Stability: No instability detected Muscle Tone/Strength: Functionally intact. No obvious neuro-muscular anomalies detected. Sensory (Neurological): Unimpaired Palpation: No palpable anomalies              Upper Extremity (UE) Exam    Side: Right upper extremity  Side: Left upper extremity  Skin & Extremity Inspection: Skin color, temperature, and hair growth are WNL. No peripheral edema or cyanosis. No masses, redness, swelling, asymmetry, or associated skin lesions. No contractures.  Skin & Extremity Inspection: Skin color, temperature, and hair growth are WNL. No peripheral edema or cyanosis. No masses, redness, swelling, asymmetry, or associated skin lesions. No contractures.  Functional ROM: Unrestricted ROM          Functional ROM: Unrestricted ROM          Muscle Tone/Strength: Functionally intact. No obvious neuro-muscular anomalies detected.  Muscle Tone/Strength: Functionally intact. No obvious neuro-muscular anomalies detected.  Sensory (Neurological): Unimpaired          Sensory (Neurological): Unimpaired          Palpation: No palpable anomalies              Palpation: No palpable anomalies              Specialized Test(s): Deferred         Specialized Test(s): Deferred          Lumbar Spine Area Exam  Skin & Axial Inspection: No masses, redness, or swelling Alignment: Symmetrical Functional ROM: Unrestricted ROM      Stability: No instability detected Muscle Tone/Strength: Functionally intact. No obvious neuro-muscular anomalies detected. Sensory (Neurological): Unimpaired Palpation: No palpable anomalies       Provocative Tests: Lumbar Hyperextension and rotation test: evaluation deferred today       Lumbar Lateral bending test: evaluation deferred today       Patrick's Maneuver: evaluation deferred today                    Gait & Posture Assessment  Ambulation: Patient ambulates using a wheel chair Gait: Relatively normal  for age and body habitus Posture: WNL   Lower Extremity Exam    Side: Right lower extremity  Side: Left lower extremity  Skin & Extremity Inspection: Skin color, temperature, and hair growth are WNL. No peripheral edema or cyanosis. No masses, redness, swelling, asymmetry, or associated skin lesions. No contractures.  Skin & Extremity Inspection: Skin color, temperature, and hair growth are WNL. No peripheral edema or cyanosis. No masses, redness, swelling, asymmetry, or associated skin lesions. No contractures.  Functional ROM: Decreased ROM          Functional ROM: Unrestricted ROM          Muscle Tone/Strength: Mild-to-moderate deconditioning  Muscle Tone/Strength: Functionally intact. No obvious neuro-muscular anomalies detected.  Sensory (Neurological): Unimpaired  Sensory (Neurological): Unimpaired  Palpation: No palpable anomalies  Palpation: No palpable anomalies   Assessment  Primary Diagnosis & Pertinent Problem List: The primary encounter diagnosis was Chronic hip pain (Location of Secondary source of pain) (Right). Diagnoses of Chronic shoulder pain (Left), Chronic low back pain (Location of Tertiary source of pain) (Bilateral) (R>L), and Chronic knee pain (Location of Primary Source of Pain) (Bilateral) (R>L) were also pertinent to this visit.  Status Diagnosis  Worsening Controlled Controlled 1. Chronic hip pain (Location of Secondary source of pain) (Right)   2. Chronic shoulder pain (Left)   3. Chronic low back pain (Location of Tertiary source of pain) (Bilateral) (R>L)   4. Chronic knee pain (Location of Primary Source of Pain) (Bilateral) (R>L)     Problems updated and reviewed during this visit: No problems updated. Plan of Care  Pharmacotherapy (Medications Ordered): No orders of the defined types were placed in this encounter.  New Prescriptions   No medications on file   Medications administered today: Karen Lewis had no medications administered during this  visit. Lab-work, procedure(s), and/or referral(s): Orders Placed This Encounter  Procedures  . HIP INJECTION   Imaging and/or referral(s): None  Interventional therapies: Planned, scheduled, and/or pending:  Right hip injection   Considering:  Series of 5 Hyalgan knee injections    Palliative PRN treatment(s):  Intra-articular Hyalgan knee injections.  Palliative bilateral genicular nerve block  Palliative bilateral lumbar facet block    Provider-requested follow-up: Return for w/ Dr. [name], Procedure(w/Sedation).  Future Appointments Date Time Provider [place]  07/07/2017 12:45 PM Milinda Pointer, MD [facility] None  08/16/2017 11:00 AM Vevelyn Francois, NP [facility] None   Primary Care Physician: Idelle Crouch, MD Location: [facility] Note by: Vevelyn Francois NP Date: 06/29/2017; Time: 1:37 PM  Pain Score Disclaimer: We use the NRS-11 scale. This is a self-reported, subjective measurement of pain severity with only modest accuracy. It is used primarily to identify changes within a particular patient. It must be understood that outpatient pain scales are significantly less accurate that those used for research, where they can be applied under ideal controlled circumstances with minimal exposure to variables. In reality, the score is likely to be a combination of pain intensity and pain affect, where pain affect describes the degree of emotional arousal or changes in action readiness caused by the sensory experience of pain. Factors such as social and work situation, setting, emotional state, anxiety levels, expectation, and prior pain experience may influence pain perception and show large inter-individual differences that may also be affected by time variables.  Patient instructions provided during this appointment: Patient Instructions    BMI Assessment: Estimated body mass index is 67.63 kg/m as calculated from the  following:   Height as of this encounter: '5\' 4"'  (1.626 m).   Weight as of this encounter: 394 lb (178.7 kg).  BMI interpretation table: BMI level Category Range association with higher incidence of chronic pain  <18 kg/m2 Underweight   18.5-24.9 kg/m2 Ideal body weight   25-29.9 kg/m2 Overweight Increased incidence by 20%  30-34.9 kg/m2 Obese (Class I) Increased incidence by 68%  35-39.9 kg/m2 Severe obesity (Class II) Increased incidence by 136%  >40 kg/m2 Extreme obesity (Class III) Increased incidence by 254%   BMI Readings from Last 4 Encounters:  06/29/17 67.63 kg/m  06/21/17 66.77 kg/m  05/31/17 71.58 kg/m  05/18/17 73.31 kg/m   Wt Readings from Last 4 Encounters:  06/29/17 (!) 394 lb (178.7 kg)  06/21/17 (!) 389 lb (176.4 kg)  05/31/17 (!) 417 lb (189.1 kg)  05/18/17 (!) 427 lb 1.6 oz (193.7 kg)  Preparing for Procedure with Sedation Instructions: . Oral Intake: Do not eat or drink anything for at least 8 hours prior to your procedure. . Transportation: Public transportation is not allowed. Bring an adult driver. The driver must be physically present in our waiting room before any procedure can be started. Marland Kitchen  Physical Assistance: Bring an adult capable of physically assisting you, in the event you need help. . Blood Pressure Medicine: Take your blood pressure medicine with a sip of water the morning of the procedure. . Insulin: Take only  of your normal insulin dose. . Preventing infections: Shower with an antibacterial soap the morning of your procedure. . Build-up your immune system: Take 1000 mg of Vitamin C with every meal (3 times a day) the day prior to your procedure. . Pregnancy: If you are pregnant, call and cancel the procedure. . Sickness: If you have a cold, fever, or any active infections, call and cancel the procedure. . Arrival: You must be in the facility at least 30 minutes prior to your scheduled procedure. . Children: Do not bring children with  you. . Dress appropriately: Bring dark clothing that you would not mind if they get stained. . Valuables: Do not bring any jewelry or valuables. Procedure appointments are reserved for interventional treatments only. Marland Kitchen No Prescription Refills. . No medication changes will be discussed during procedure appointments. . No disability issues will be discussed.

## 2017-06-29 NOTE — Addendum Note (Signed)
Addended by: Vevelyn Francois on: 06/29/2017 11:13 AM   Modules accepted: Orders

## 2017-06-29 NOTE — Patient Instructions (Addendum)
  BMI Assessment: Estimated body mass index is 67.63 kg/m as calculated from the following:   Height as of this encounter: 5\' 4"  (1.626 m).   Weight as of this encounter: 394 lb (178.7 kg).  BMI interpretation table: BMI level Category Range association with higher incidence of chronic pain  <18 kg/m2 Underweight   18.5-24.9 kg/m2 Ideal body weight   25-29.9 kg/m2 Overweight Increased incidence by 20%  30-34.9 kg/m2 Obese (Class I) Increased incidence by 68%  35-39.9 kg/m2 Severe obesity (Class II) Increased incidence by 136%  >40 kg/m2 Extreme obesity (Class III) Increased incidence by 254%   BMI Readings from Last 4 Encounters:  06/29/17 67.63 kg/m  06/21/17 66.77 kg/m  05/31/17 71.58 kg/m  05/18/17 73.31 kg/m   Wt Readings from Last 4 Encounters:  06/29/17 (!) 394 lb (178.7 kg)  06/21/17 (!) 389 lb (176.4 kg)  05/31/17 (!) 417 lb (189.1 kg)  05/18/17 (!) 427 lb 1.6 oz (193.7 kg)  Preparing for Procedure with Sedation Instructions: . Oral Intake: Do not eat or drink anything for at least 8 hours prior to your procedure. . Transportation: Public transportation is not allowed. Bring an adult driver. The driver must be physically present in our waiting room before any procedure can be started. Marland Kitchen Physical Assistance: Bring an adult capable of physically assisting you, in the event you need help. . Blood Pressure Medicine: Take your blood pressure medicine with a sip of water the morning of the procedure. . Insulin: Take only  of your normal insulin dose. . Preventing infections: Shower with an antibacterial soap the morning of your procedure. . Build-up your immune system: Take 1000 mg of Vitamin C with every meal (3 times a day) the day prior to your procedure. . Pregnancy: If you are pregnant, call and cancel the procedure. . Sickness: If you have a cold, fever, or any active infections, call and cancel the procedure. . Arrival: You must be in the facility at least 30 minutes  prior to your scheduled procedure. . Children: Do not bring children with you. . Dress appropriately: Bring dark clothing that you would not mind if they get stained. . Valuables: Do not bring any jewelry or valuables. Procedure appointments are reserved for interventional treatments only. Marland Kitchen No Prescription Refills. . No medication changes will be discussed during procedure appointments. . No disability issues will be discussed.

## 2017-07-07 ENCOUNTER — Encounter: Payer: Self-pay | Admitting: Pain Medicine

## 2017-07-07 ENCOUNTER — Ambulatory Visit (HOSPITAL_BASED_OUTPATIENT_CLINIC_OR_DEPARTMENT_OTHER): Payer: Medicare HMO | Admitting: Pain Medicine

## 2017-07-07 ENCOUNTER — Ambulatory Visit
Admission: RE | Admit: 2017-07-07 | Discharge: 2017-07-07 | Disposition: A | Discharge status: Home / Self Care | Payer: Medicare HMO | Source: Ambulatory Visit | Attending: Pain Medicine | Admitting: Pain Medicine

## 2017-07-07 VITALS — BP 109/87 | HR 85 | Temp 97.0°F | Resp 20 | Ht 64.0 in | Wt 387.0 lb

## 2017-07-07 DIAGNOSIS — Z9889 Other specified postprocedural states: Secondary | ICD-10-CM | POA: Insufficient documentation

## 2017-07-07 DIAGNOSIS — Z903 Acquired absence of stomach [part of]: Secondary | ICD-10-CM | POA: Insufficient documentation

## 2017-07-07 DIAGNOSIS — Z886 Allergy status to analgesic agent status: Secondary | ICD-10-CM | POA: Diagnosis not present

## 2017-07-07 DIAGNOSIS — M25551 Pain in right hip: Secondary | ICD-10-CM | POA: Diagnosis not present

## 2017-07-07 DIAGNOSIS — M1611 Unilateral primary osteoarthritis, right hip: Secondary | ICD-10-CM | POA: Insufficient documentation

## 2017-07-07 DIAGNOSIS — Z9884 Bariatric surgery status: Secondary | ICD-10-CM | POA: Insufficient documentation

## 2017-07-07 DIAGNOSIS — G8929 Other chronic pain: Secondary | ICD-10-CM

## 2017-07-07 DIAGNOSIS — M6530 Trigger finger, unspecified finger: Secondary | ICD-10-CM | POA: Insufficient documentation

## 2017-07-07 DIAGNOSIS — Z91013 Allergy to seafood: Secondary | ICD-10-CM | POA: Diagnosis not present

## 2017-07-07 DIAGNOSIS — Z6841 Body Mass Index (BMI) 40.0 and over, adult: Secondary | ICD-10-CM | POA: Insufficient documentation

## 2017-07-07 DIAGNOSIS — Z9049 Acquired absence of other specified parts of digestive tract: Secondary | ICD-10-CM | POA: Insufficient documentation

## 2017-07-07 DIAGNOSIS — Z881 Allergy status to other antibiotic agents status: Secondary | ICD-10-CM | POA: Insufficient documentation

## 2017-07-07 MED ORDER — LIDOCAINE HCL 2 % IJ SOLN
10.0000 mL | Freq: Once | INTRAMUSCULAR | Status: AC
  Administered 2017-07-07: 200 mg

## 2017-07-07 MED ORDER — METHYLPREDNISOLONE ACETATE 80 MG/ML IJ SUSP
80.0000 mg | Freq: Once | INTRAMUSCULAR | Status: AC
  Administered 2017-07-07: 80 mg via INTRA_ARTICULAR

## 2017-07-07 MED ORDER — MIDAZOLAM HCL 5 MG/5ML IJ SOLN
1.0000 mg | INTRAMUSCULAR | Status: DC | PRN
  Administered 2017-07-07: 1 mg via INTRAVENOUS

## 2017-07-07 MED ORDER — FENTANYL CITRATE (PF) 100 MCG/2ML IJ SOLN
INTRAMUSCULAR | Status: AC
  Filled 2017-07-07: qty 2

## 2017-07-07 MED ORDER — FENTANYL CITRATE (PF) 100 MCG/2ML IJ SOLN: 25.0000 ug | INTRAMUSCULAR | Status: DC | PRN

## 2017-07-07 MED ORDER — METHYLPREDNISOLONE ACETATE 80 MG/ML IJ SUSP
INTRAMUSCULAR | Status: AC
  Filled 2017-07-07: qty 1

## 2017-07-07 MED ORDER — ROPIVACAINE HCL 2 MG/ML IJ SOLN
4.0000 mL | Freq: Once | INTRAMUSCULAR | Status: AC
  Administered 2017-07-07: 4.5 mL via INTRA_ARTICULAR

## 2017-07-07 MED ORDER — LACTATED RINGERS IV SOLN: 1000.0000 mL | Freq: Once | INTRAVENOUS | Status: DC

## 2017-07-07 MED ORDER — FENTANYL CITRATE (PF) 100 MCG/2ML IJ SOLN
25.0000 ug | INTRAMUSCULAR | Status: DC | PRN
Start: 2017-07-07 — End: 2017-07-07
  Administered 2017-07-07: 50 ug via INTRAVENOUS

## 2017-07-07 MED ORDER — MIDAZOLAM HCL 5 MG/5ML IJ SOLN: 1.0000 mg | INTRAMUSCULAR | Status: DC | PRN

## 2017-07-07 MED ORDER — LACTATED RINGERS IV SOLN
1000.0000 mL | Freq: Once | INTRAVENOUS | Status: AC
  Administered 2017-07-07: 1000 mL via INTRAVENOUS

## 2017-07-07 MED ORDER — ROPIVACAINE HCL 2 MG/ML IJ SOLN
INTRAMUSCULAR | Status: AC
  Filled 2017-07-07: qty 10

## 2017-07-07 MED ORDER — LIDOCAINE HCL 2 % IJ SOLN
INTRAMUSCULAR | Status: AC
  Filled 2017-07-07: qty 20

## 2017-07-07 MED ORDER — MIDAZOLAM HCL 5 MG/5ML IJ SOLN
INTRAMUSCULAR | Status: AC
  Filled 2017-07-07: qty 5

## 2017-07-07 NOTE — Progress Notes (Signed)
Patient's Name: Karen Lewis  MRN: [id number]  Referring Provider: Vevelyn Francois, NP  DOB: 1971/01/01  PCP: Idelle Crouch, MD  DOS: 07/07/2017  Note by: Gaspar Cola, MD  Service setting: Ambulatory outpatient  Specialty: Interventional Pain Management  Patient type: Established  Location: [facility] (AMB) Pain Management Facility  Visit type: Interventional Procedure   Primary Reason for Visit: Interventional Pain Management Treatment. CC: Hip Pain (right)  Procedure:  Anesthesia, Analgesia, Anxiolysis:  Type: Therapeutic Intra-Articular Hip Injection Region:  Anterolateral hip joint area. Level: Lower pelvic and hip joint level. Laterality: Right-Sided  Type: Local Anesthesia with Moderate (Conscious) Sedation Local Anesthetic: Lidocaine 1% Route: Intravenous (IV) IV Access: Secured Sedation: Meaningful verbal contact was maintained at all times during the procedure  Indication(s): Analgesia and Anxiety   Indications: 1. Chronic hip pain (Secondary source of pain) (Right)   2. Osteoarthritis of hip (Right)   3. Morbid obesity with BMI of 70 and over, adult ([place]) (71.75 on 08/27/2015)    Pain Score: Pre-procedure: 7 /10 Post-procedure: 0-No pain/10  Pre-op Assessment:  Karen Lewis is a 46 y.o. (year old), female patient, seen today for interventional treatment. She  has a past surgical history that includes Laparoscopic partial gastrectomy; Shoulder arthroscopy (Right); Joint replacement (Left, hip); Carpal tunnel release (Bilateral); Diagnostic laparoscopy; Cholecystectomy; Trigger finger release (Right); Thyroidectomy (N/A, 11/12/2015); left trigger finger; Roux-en-Y Gastric Bypass (06/03/2017); and Hiatal hernia repair. Karen Lewis has a current medication list which includes the following prescription(s): acetaminophen, acyclovir, albuterol, amitriptyline, accu-chek aviva, accu-chek aviva plus, butalbital-acetaminophen-caffeine, ferrous sulfate, gabapentin, guaifenesin-codeine,  insulin pen needle, ipratropium-albuterol, ketoconazole, simple diagnostics lancing dev, levocetirizine, levothyroxine, levothyroxine, magnesium oxide, meclizine, medroxyprogesterone, metformin, metoprolol tartrate, montelukast, multivitamin with minerals, naloxone, omeprazole, pregabalin, quetiapine, rosuvastatin, sertraline, spironolactone, tizanidine, triamcinolone, triamcinolone, true metrix blood glucose test, ulticare alcohol swabs, vitamin b-12, vitamin d (ergocalciferol), zonisamide, oxycodone, oxycodone, oxycodone, phentermine, ranitidine, and ranitidine, and the following Facility-Administered Medications: fentanyl, lactated ringers, and midazolam. Her primarily concern today is the Hip Pain (right)  Initial Vital Signs: There were no vitals taken for this visit. BMI: Estimated body mass index is 66.43 kg/m as calculated from the following:   Height as of this encounter: 5\' 4"  (1.626 m).   Weight as of this encounter: 387 lb (175.5 kg).  Risk Assessment: Allergies: Reviewed. She is allergic to bactrim [sulfamethoxazole-trimethoprim]; xolair [omalizumab]; ciprofloxacin; shellfish allergy; aspirin; clindamycin; motrin [ibuprofen]; nsaids; other; and sulfa antibiotics.  Allergy Precautions: No radiological contrast used Coagulopathies: Reviewed. None identified.  Blood-thinner therapy: None at this time Active Infection(s): Reviewed. None identified. Karen Lewis is afebrile  Site Confirmation: Karen Lewis was asked to confirm the procedure and laterality before marking the site Procedure checklist: Completed Consent: Before the procedure and under the influence of no sedative(s), amnesic(s), or anxiolytics, the patient was informed of the treatment options, risks and possible complications. To fulfill our ethical and legal obligations, as recommended by the American Medical Association's Code of Ethics, I have informed the patient of my clinical impression; the nature and purpose of the treatment  or procedure; the risks, benefits, and possible complications of the intervention; the alternatives, including doing nothing; the risk(s) and benefit(s) of the alternative treatment(s) or procedure(s); and the risk(s) and benefit(s) of doing nothing. The patient was provided information about the general risks and possible complications associated with the procedure. These may include, but are not limited to: failure to achieve desired goals, infection, bleeding, organ or nerve damage, allergic reactions, paralysis, and death. In addition,  the patient was informed of those risks and complications associated to the procedure, such as failure to decrease pain; infection; bleeding; organ or nerve damage with subsequent damage to sensory, motor, and/or autonomic systems, resulting in permanent pain, numbness, and/or weakness of one or several areas of the body; allergic reactions; (i.e.: anaphylactic reaction); and/or death. Furthermore, the patient was informed of those risks and complications associated with the medications. These include, but are not limited to: allergic reactions (i.e.: anaphylactic or anaphylactoid reaction(s)); adrenal axis suppression; blood sugar elevation that in diabetics may result in ketoacidosis or comma; water retention that in patients with history of congestive heart failure may result in shortness of breath, pulmonary edema, and decompensation with resultant heart failure; weight gain; swelling or edema; medication-induced neural toxicity; particulate matter embolism and blood vessel occlusion with resultant organ, and/or nervous system infarction; and/or aseptic necrosis of one or more joints. Finally, the patient was informed that Medicine is not an exact science; therefore, there is also the possibility of unforeseen or unpredictable risks and/or possible complications that may result in a catastrophic outcome. The patient indicated having understood very clearly. We have given  the patient no guarantees and we have made no promises. Enough time was given to the patient to ask questions, all of which were answered to the patient's satisfaction. Ms. Ramthun has indicated that she wanted to continue with the procedure. Attestation: I, the ordering provider, attest that I have discussed with the patient the benefits, risks, side-effects, alternatives, likelihood of achieving goals, and potential problems during recovery for the procedure that I have provided informed consent. Date: 07/07/2017; Time: 10:10 AM  Pre-Procedure Preparation:  Monitoring: As per clinic protocol. Respiration, ETCO2, SpO2, BP, heart rate and rhythm monitor placed and checked for adequate function Safety Precautions: Patient was assessed for positional comfort and pressure points before starting the procedure. Time-out: I initiated and conducted the "Time-out" before starting the procedure, as per protocol. The patient was asked to participate by confirming the accuracy of the "Time Out" information. Verification of the correct person, site, and procedure were performed and confirmed by me, the nursing staff, and the patient. "Time-out" conducted as per Joint Commission's Universal Protocol (UP.01.01.01). "Time-out" Date & Time: 07/07/2017; 1310 hrs.  Description of Procedure Process:   Position: Supine Target Area: Superior aspect of the hip joint cavity, going thru the superior portion of the capsular ligament. Approach: Anterior approach. Area Prepped: Entire Anterolateral hip area. Prepping solution: ChloraPrep (2% chlorhexidine gluconate and 70% isopropyl alcohol) Safety Precautions: Aspiration looking for blood return was conducted prior to all injections. At no point did we inject any substances, as a needle was being advanced. No attempts were made at seeking any paresthesias. Safe injection practices and needle disposal techniques used. Medications properly checked for expiration dates. SDV (single  dose vial) medications used. Description of the Procedure: Protocol guidelines were followed. The patient was placed in position over the fluoroscopy table. The target area was identified and the area prepped in the usual manner. Skin & deeper tissues infiltrated with local anesthetic. Appropriate amount of time allowed to pass for local anesthetics to take effect. The procedure needles were then advanced to the target area. Proper needle placement secured. Negative aspiration confirmed. Solution injected in intermittent fashion, asking for systemic symptoms every 0.5cc of injectate. The needles were then removed and the area cleansed, making sure to leave some of the prepping solution back to take advantage of its long term bactericidal properties. Vitals:   07/07/17  1320 07/07/17 1332 07/07/17 1343 07/07/17 1353  BP: (!) 141/94 117/77 119/75 109/87  Pulse: 85     Resp: 16 (!) 21 (!) 22 20  Temp:  (!) 97 F (36.1 C)    TempSrc:  Temporal    SpO2: 94% 95% 100% 96%  Weight:      Height:        Start Time: 1310 hrs. End Time: 1319 hrs. Materials:  Needle(s) Type: Regular needle Gauge: 22G Length: 7-in Medication(s): We administered lidocaine, methylPREDNISolone acetate, ropivacaine (PF) 2 mg/mL (0.2%), lactated ringers, midazolam, and fentaNYL. Please see chart orders for dosing details.  Imaging Guidance (Non-Spinal):  Type of Imaging Technique: Fluoroscopy Guidance (Non-Spinal) Indication(s): Assistance in needle guidance and placement for procedures requiring needle placement in or near specific anatomical locations not easily accessible without such assistance. Exposure Time: Please see nurses notes. Contrast: None used. Fluoroscopic Guidance: I was personally present during the use of fluoroscopy. "Tunnel Vision Technique" used to obtain the best possible view of the target area. Parallax error corrected before commencing the procedure. "Direction-depth-direction" technique used to  introduce the needle under continuous pulsed fluoroscopy. Once target was reached, antero-posterior, oblique, and lateral fluoroscopic projection used confirm needle placement in all planes. Images permanently stored in EMR. Interpretation: No contrast injected.  Antibiotic Prophylaxis:  Indication(s): None identified Antibiotic given: None  Post-operative Assessment:  EBL: None Complications: No immediate post-treatment complications observed by team, or reported by patient. Note: The patient tolerated the entire procedure well. A repeat set of vitals were taken after the procedure and the patient was kept under observation following institutional policy, for this type of procedure. Post-procedural neurological assessment was performed, showing return to baseline, prior to discharge. The patient was provided with post-procedure discharge instructions, including a section on how to identify potential problems. Should any problems arise concerning this procedure, the patient was given instructions to immediately contact us, at any time, without hesitation. In any case, we plan to contact the patient by telephone for a follow-up status report regarding this interventional procedure. Comments:  No additional relevant information.  Plan of Care    Imaging Orders     DG C-Arm 1-60 Min-No Report Procedure Orders    No procedure(s) ordered today   Possible POC:  Palliative right intra-articular hip joint injection (None until after 07/07/18) Palliative bilateral intra-articular Hyalgan knee injection 5 (none until after 10/12/17) Palliative left intra-articular shoulder joint injection (none until after 05/31/18) Palliative bilateral genicular nerve block  Possible bilateral genicular nerve RFA  Palliative bilateral lumbar facet block  Possible bilateral Lumbar facet RFA    Medications ordered for procedure: Meds ordered this encounter  Medications  . lidocaine (XYLOCAINE) 2 % (with pres)  injection 200 mg  . methylPREDNISolone acetate (DEPO-MEDROL) injection 80 mg  . ropivacaine (PF) 2 mg/mL (0.2%) (NAROPIN) injection 4 mL  . lactated ringers infusion 1,000 mL  . midazolam (VERSED) 5 MG/5ML injection 1-2 mg    Make sure Flumazenil is available in the pyxis when using this medication. If oversedation occurs, administer 0.2 mg IV over 15 sec. If after 45 sec no response, administer 0.2 mg again over 1 min; may repeat at 1 min intervals; not to exceed 4 doses (1 mg)  . fentaNYL (SUBLIMAZE) injection 25-50 mcg    Make sure Narcan is available in the pyxis when using this medication. In the event of respiratory depression (RR< 8/min): Titrate NARCAN (naloxone) in increments of 0.1 to 0.2 mg IV at 2-3 minute intervals,  until desired degree of reversal.   Medications administered: We administered lidocaine, methylPREDNISolone acetate, ropivacaine (PF) 2 mg/mL (0.2%), lactated ringers, midazolam, and fentaNYL.  See the medical record for exact dosing, route, and time of administration.  New Prescriptions   No medications on file   Disposition: Discharge home  Discharge Date & Time: 07/07/2017; 1354 hrs.   Physician-requested Follow-up: Return for post-procedure eval (2 wks)(w/ Dionisio David, NP). Future Appointments Date Time Provider [place]  07/21/2017 11:30 AM Vevelyn Francois, NP [facility] None  08/15/2017 10:20 AM Gae Dry, MD [facility] None  08/16/2017 11:00 AM Vevelyn Francois, NP [facility] None   Primary Care Physician: Idelle Crouch, MD Location: [facility] Note by: Gaspar Cola, MD Date: 07/07/2017; Time: 2:12 PM  Disclaimer:  Medicine is not an Chief Strategy Officer. The only guarantee in medicine is that nothing is guaranteed. It is important to note that the decision to proceed with this intervention was based on the information collected from the patient. The Data and conclusions were drawn from the patient's  questionnaire, the interview, and the physical examination. Because the information was provided in large part by the patient, it cannot be guaranteed that it has not been purposely or unconsciously manipulated. Every effort has been made to obtain as much relevant data as possible for this evaluation. It is important to note that the conclusions that lead to this procedure are derived in large part from the available data. Always take into account that the treatment will also be dependent on availability of resources and existing treatment guidelines, considered by other Pain Management Practitioners as being common knowledge and practice, at the time of the intervention. For Medico-Legal purposes, it is also important to point out that variation in procedural techniques and pharmacological choices are the acceptable norm. The indications, contraindications, technique, and results of the above procedure should only be interpreted and judged by a Board-Certified Interventional Pain Specialist with extensive familiarity and expertise in the same exact procedure and technique.

## 2017-07-07 NOTE — Patient Instructions (Signed)

## 2017-07-08 ENCOUNTER — Telehealth: Payer: Self-pay

## 2017-07-08 NOTE — Telephone Encounter (Signed)
PoSt procedure phone call.  Patient states she is doing better than after the other injections.

## 2017-07-21 ENCOUNTER — Other Ambulatory Visit: Payer: Self-pay

## 2017-07-21 ENCOUNTER — Ambulatory Visit: Payer: Medicare HMO | Attending: Nurse Practitioner | Admitting: Nurse Practitioner

## 2017-07-21 ENCOUNTER — Encounter: Payer: Self-pay | Admitting: Nurse Practitioner

## 2017-07-21 VITALS — BP 135/65 | HR 94 | Temp 99.1°F | Resp 18 | Ht 64.0 in | Wt 386.0 lb

## 2017-07-21 DIAGNOSIS — M545 Low back pain, unspecified: Secondary | ICD-10-CM

## 2017-07-21 DIAGNOSIS — K449 Diaphragmatic hernia without obstruction or gangrene: Secondary | ICD-10-CM | POA: Insufficient documentation

## 2017-07-21 DIAGNOSIS — E039 Hypothyroidism, unspecified: Secondary | ICD-10-CM | POA: Insufficient documentation

## 2017-07-21 DIAGNOSIS — Z794 Long term (current) use of insulin: Secondary | ICD-10-CM | POA: Insufficient documentation

## 2017-07-21 DIAGNOSIS — M47816 Spondylosis without myelopathy or radiculopathy, lumbar region: Secondary | ICD-10-CM

## 2017-07-21 DIAGNOSIS — G894 Chronic pain syndrome: Secondary | ICD-10-CM | POA: Diagnosis not present

## 2017-07-21 DIAGNOSIS — Z79899 Other long term (current) drug therapy: Secondary | ICD-10-CM | POA: Insufficient documentation

## 2017-07-21 DIAGNOSIS — F329 Major depressive disorder, single episode, unspecified: Secondary | ICD-10-CM | POA: Insufficient documentation

## 2017-07-21 DIAGNOSIS — E119 Type 2 diabetes mellitus without complications: Secondary | ICD-10-CM | POA: Insufficient documentation

## 2017-07-21 DIAGNOSIS — G8929 Other chronic pain: Secondary | ICD-10-CM | POA: Diagnosis not present

## 2017-07-21 DIAGNOSIS — M25551 Pain in right hip: Secondary | ICD-10-CM | POA: Insufficient documentation

## 2017-07-21 DIAGNOSIS — E785 Hyperlipidemia, unspecified: Secondary | ICD-10-CM | POA: Diagnosis not present

## 2017-07-21 DIAGNOSIS — Z5181 Encounter for therapeutic drug level monitoring: Secondary | ICD-10-CM | POA: Insufficient documentation

## 2017-07-21 DIAGNOSIS — F1120 Opioid dependence, uncomplicated: Secondary | ICD-10-CM | POA: Diagnosis not present

## 2017-07-21 DIAGNOSIS — E559 Vitamin D deficiency, unspecified: Secondary | ICD-10-CM | POA: Insufficient documentation

## 2017-07-21 DIAGNOSIS — E042 Nontoxic multinodular goiter: Secondary | ICD-10-CM | POA: Diagnosis not present

## 2017-07-21 DIAGNOSIS — E282 Polycystic ovarian syndrome: Secondary | ICD-10-CM | POA: Diagnosis not present

## 2017-07-21 DIAGNOSIS — M109 Gout, unspecified: Secondary | ICD-10-CM | POA: Insufficient documentation

## 2017-07-21 DIAGNOSIS — E782 Mixed hyperlipidemia: Secondary | ICD-10-CM | POA: Diagnosis not present

## 2017-07-21 DIAGNOSIS — G4700 Insomnia, unspecified: Secondary | ICD-10-CM | POA: Diagnosis not present

## 2017-07-21 DIAGNOSIS — I10 Essential (primary) hypertension: Secondary | ICD-10-CM | POA: Diagnosis not present

## 2017-07-21 DIAGNOSIS — G4730 Sleep apnea, unspecified: Secondary | ICD-10-CM | POA: Diagnosis not present

## 2017-07-21 DIAGNOSIS — K219 Gastro-esophageal reflux disease without esophagitis: Secondary | ICD-10-CM | POA: Insufficient documentation

## 2017-07-21 DIAGNOSIS — Z881 Allergy status to other antibiotic agents status: Secondary | ICD-10-CM | POA: Insufficient documentation

## 2017-07-21 DIAGNOSIS — Z6841 Body Mass Index (BMI) 40.0 and over, adult: Secondary | ICD-10-CM | POA: Diagnosis not present

## 2017-07-21 DIAGNOSIS — F419 Anxiety disorder, unspecified: Secondary | ICD-10-CM | POA: Insufficient documentation

## 2017-07-21 DIAGNOSIS — Z886 Allergy status to analgesic agent status: Secondary | ICD-10-CM | POA: Diagnosis not present

## 2017-07-21 DIAGNOSIS — J45909 Unspecified asthma, uncomplicated: Secondary | ICD-10-CM | POA: Insufficient documentation

## 2017-07-21 DIAGNOSIS — E1142 Type 2 diabetes mellitus with diabetic polyneuropathy: Secondary | ICD-10-CM | POA: Diagnosis not present

## 2017-07-21 DIAGNOSIS — M170 Bilateral primary osteoarthritis of knee: Secondary | ICD-10-CM | POA: Insufficient documentation

## 2017-07-21 DIAGNOSIS — M797 Fibromyalgia: Secondary | ICD-10-CM | POA: Diagnosis not present

## 2017-07-21 DIAGNOSIS — Z9884 Bariatric surgery status: Secondary | ICD-10-CM | POA: Diagnosis not present

## 2017-07-21 NOTE — Progress Notes (Signed)
Safety precautions to be maintained throughout the outpatient stay will include: orient to surroundings, keep bed in low position, maintain call bell within reach at all times, provide assistance with transfer out of bed and ambulation.  

## 2017-07-21 NOTE — Patient Instructions (Signed)
  BMI Assessment: Estimated body mass index is 66.26 kg/m as calculated from the following:   Height as of this encounter: 5\' 4"  (1.626 m).   Weight as of this encounter: 386 lb (175.1 kg).  BMI interpretation table: BMI level Category Range association with higher incidence of chronic pain  <18 kg/m2 Underweight   18.5-24.9 kg/m2 Ideal body weight   25-29.9 kg/m2 Overweight Increased incidence by 20%  30-34.9 kg/m2 Obese (Class I) Increased incidence by 68%  35-39.9 kg/m2 Severe obesity (Class II) Increased incidence by 136%  >40 kg/m2 Extreme obesity (Class III) Increased incidence by 254%   BMI Readings from Last 4 Encounters:  07/21/17 66.26 kg/m  07/07/17 66.43 kg/m  06/29/17 67.63 kg/m  06/21/17 66.77 kg/m   Wt Readings from Last 4 Encounters:  07/21/17 (!) 386 lb (175.1 kg)  07/07/17 (!) 387 lb (175.5 kg)  06/29/17 (!) 394 lb (178.7 kg)  06/21/17 (!) 389 lb (176.4 kg)

## 2017-07-21 NOTE — Progress Notes (Signed)
Patient's Name: Karen Lewis  MRN: [id number]  Referring Provider: Idelle Crouch, MD  DOB: 04-10-1971  PCP: Idelle Crouch, MD  DOS: 07/21/2017  Note by: Vevelyn Francois NP  Service setting: Ambulatory outpatient  Specialty: Interventional Pain Management  Location: [facility] (AMB) Pain Management Facility    Patient type: Established    Primary Reason(s) for Visit: Encounter for prescription drug management & post-procedure evaluation of chronic illness with mild to moderate exacerbation(Level of risk: moderate) CC: Hip Pain (right); Back Pain (mid and low on the right); and Knee Pain (bilateral)  HPI  Karen Lewis is a 46 y.o. year old, female patient, who comes today for a post-procedure evaluation and medication management. She has History of asthma; Binge eating disorder; Fibromyalgia; Anxiety, generalized; Insomnia, persistent; Depression, major, recurrent, moderate ([place]); Uncomplicated asthma; Eczema; Gout; Hyperlipidemia; Headache, migraine; Obstructive apnea; Bilateral polycystic ovarian syndrome; Apnea, sleep; Disease of thyroid gland; Avitaminosis D; History of surgical procedure; Type 2 diabetes mellitus ([place]); Long term current use of opiate analgesic; Long term prescription opiate use; Opiate use; Encounter for therapeutic drug level monitoring; Opiate dependence ([place]); Essential (primary) hypertension; Goiter, nontoxic, multinodular; Chronic knee pain (Primary Source of Pain) (Bilateral) (R>L); Chronic low back pain [facility] source of pain) (Bilateral) (R>L); Lumbar facet syndrome (Bilateral) (R>L); Osteoarthritis; Grade 1 (1.4 cm) Anterolisthesis of L4 over L5; Chronic hip pain (Secondary source of pain) (Right); S/P THR (total hip replacement) (Left); History of methicillin resistant staphylococcus aureus (MRSA); History of bariatric surgery; Hypomagnesemia; Morbid obesity with BMI of 70 and over, adult ([place]) (71.75 on 08/27/2015); History of methicillin resistant Staphylococcus aureus  infection; Lumbar spondylosis; Vitamin D insufficiency; Dysphagia, unspecified; Gastroesophageal reflux disease without esophagitis; Chest pain with low risk of acute coronary syndrome; Nontoxic goiter; Other psychoactive substance dependence, uncomplicated (HCC); SOB (shortness of breath) on exertion; Chronic pain syndrome; Osteoarthritis of knee (Bilateral) (R>L); Neurogenic pain; Upper extremity pain; Chronic shoulder pain (Left); Osteoarthritis of shoulder (Left); Controlled drug dependence ([place]); and Osteoarthritis of hip (Right) on their problem list. Her primarily concern today is the Hip Pain (right); Back Pain (mid and low on the right); and Knee Pain (bilateral)  Pain Assessment: Location: Right Hip Radiating: back, knees Onset: More than a month ago Duration: Chronic pain Quality: Aching, Sharp Severity: 5 /10 (self-reported pain score)  Note: Reported level is compatible with observation.                         When using our objective Pain Scale, levels between 6 and 10/10 are said to belong in an emergency room, as it progressively worsens from a 6/10, described as severely limiting, requiring emergency care not usually available at an outpatient pain management facility. At a 6/10 level, communication becomes difficult and requires great effort. Assistance to reach the emergency department may be required. Facial flushing and profuse sweating along with potentially dangerous increases in heart rate and blood pressure will be evident. Effect on ADL:   Timing: Constant Modifying factors: heat, movement, injections  [name] was last seen on 06/29/2017 for a procedure. During today's appointment we reviewed Karen Lewis's post-procedure results, as well as her outpatient medication regimen. She was concern that her hips are getting worse. She admits that she has congental hip problems. She admits that she able to do more than prior to the hip injection. She is concern now that she needs a  procedure for her lower back. She states that her weight loss  is going. She does have the lower back pain but denies it radiating down her leg. She does have weakness.   Further details on both, my assessment(s), as well as the proposed treatment plan, please see below.  Post-Procedure Assessment  06/29/2017 Procedure: Hip injection; right   Pre-procedure pain score:  7/10 Post-procedure pain score: 0/10         Influential Factors: BMI: 66.26 kg/m Intra-procedural challenges: None observed.         Assessment challenges: None detected.              Reported side-effects: None.        Post-procedural adverse reactions or complications: None reported         Sedation: Please see nurses note. When no sedatives are used, the analgesic levels obtained are directly associated to the effectiveness of the local anesthetics. However, when sedation is provided, the level of analgesia obtained during the initial 1 hour following the intervention, is believed to be the result of a combination of factors. These factors may include, but are not limited to: 1. The effectiveness of the local anesthetics used. 2. The effects of the analgesic(s) and/or anxiolytic(s) used. 3. The degree of discomfort experienced by the patient at the time of the procedure. 4. The patients ability and reliability in recalling and recording the events. 5. The presence and influence of possible secondary gains and/or psychosocial factors. Reported result: Relief experienced during the 1st hour after the procedure: 100 % (Ultra-Short Term Relief)            Interpretative annotation: Clinically appropriate result. Analgesia during this period is likely to be Local Anesthetic and/or IV Sedative (Analgesic/Anxiolytic) related.          Effects of local anesthetic: The analgesic effects attained during this period are directly associated to the localized infiltration of local anesthetics and therefore cary significant diagnostic  value as to the etiological location, or anatomical origin, of the pain. Expected duration of relief is directly dependent on the pharmacodynamics of the local anesthetic used. Long-acting (4-6 hours) anesthetics used.  Reported result: Relief during the next 4 to 6 hour after the procedure: 50 % (Short-Term Relief)            Interpretative annotation: Clinically appropriate result. Analgesia during this period is likely to be Local Anesthetic-related.          Long-term benefit: Defined as the period of time past the expected duration of local anesthetics (1 hour for short-acting and 4-6 hours for long-acting). With the possible exception of prolonged sympathetic blockade from the local anesthetics, benefits during this period are typically attributed to, or associated with, other factors such as analgesic sensory neuropraxia, antiinflammatory effects, or beneficial biochemical changes provided by agents other than the local anesthetics.  Reported result: Extended relief following procedure: 25 % (Long-Term Relief)            Interpretative annotation: Clinically appropriate result. Good relief. No permanent benefit expected. Inflammation plays a part in the etiology to the pain.          Current benefits: Defined as reported results that persistent at this point in time.   Analgesia: >50 % Ms. Weyman reports improvement of extremity symptoms. Function: Somewhat improved ROM: Somewhat improved Interpretative annotation: Recurrence of symptoms. No permanent benefit expected. Effective diagnostic intervention.          Interpretation: Results would suggest a successful diagnostic intervention.  Plan:  Please see "Plan of Care" for details.        Laboratory Chemistry  Inflammation Markers (CRP: Acute Phase) (ESR: Chronic Phase) Lab Results  Component Value Date   CRP <0.5 11/24/2015   ESRSEDRATE 36 (H) 11/24/2015                 Renal Function Markers Lab Results  Component  Value Date   BUN 8 11/24/2015   CREATININE 0.80 05/11/2016   GFRAA >60 11/24/2015   GFRNONAA >60 11/24/2015                 Hepatic Function Markers Lab Results  Component Value Date   AST 23 11/24/2015   ALT 20 11/24/2015   ALBUMIN 3.7 11/24/2015   ALKPHOS 81 11/24/2015                 Electrolytes Lab Results  Component Value Date   NA 139 11/24/2015   K 4.3 11/24/2015   CL 105 11/24/2015   CALCIUM 8.6 (L) 11/24/2015   MG 1.6 (L) 11/24/2015                 Neuropathy Markers Lab Results  Component Value Date   VITAMINB12 303 11/24/2015                 Bone Pathology Markers Lab Results  Component Value Date   ALKPHOS 81 11/24/2015   VD25OH 27.4 (L) 11/24/2015   VD125OH2TOT 41.5 11/24/2015   CALCIUM 8.6 (L) 11/24/2015                 Rheumatology Markers No results found for: LABURIC              Coagulation Parameters Lab Results  Component Value Date   INR 0.95 05/07/2015   LABPROT 12.9 05/07/2015   APTT 26 05/07/2015   PLT 337 11/12/2015                 Cardiovascular Markers Lab Results  Component Value Date   HGB 12.4 11/12/2015   HCT 38.0 11/12/2015                 CA Markers No results found for: CEA, CA125               Note: Lab results reviewed.  Recent Diagnostic Imaging Results  DG C-Arm 1-60 Min-No Report Fluoroscopy was utilized by the requesting physician.  No radiographic  interpretation.   Complexity Note: Imaging results reviewed. Results shared with Ms. Brigitte Pulse, using Layman's terms.                         Meds   Current Outpatient Medications:  .  Acetaminophen 500 MG coapsule, Take 500 mg by mouth every 6 (six) hours as needed. , Disp: , Rfl:  .  albuterol (PROVENTIL HFA;VENTOLIN HFA) 108 (90 BASE) MCG/ACT inhaler, Inhale 2 puffs into the lungs every 6 (six) hours as needed for wheezing or shortness of breath., Disp: , Rfl:  .  amitriptyline (ELAVIL) 100 MG tablet, Take 100 mg by mouth at bedtime. , Disp: , Rfl:  .   Blood Glucose Calibration (ACCU-CHEK AVIVA) SOLN, , Disp: , Rfl:  .  Blood Glucose Monitoring Suppl (ACCU-CHEK AVIVA PLUS) w/Device KIT, , Disp: , Rfl:  .  gabapentin (NEURONTIN) 800 MG tablet, Take 1 tablet (800 mg total) by mouth every 6 (six) hours. (Patient taking differently: Take 1,600 mg by  mouth 2 (two) times daily. ), Disp: 360 tablet, Rfl: 0 .  Insulin Pen Needle (FIFTY50 PEN NEEDLES) 31G X 8 MM MISC, Use once daily., Disp: , Rfl:  .  ketoconazole (NIZORAL) 2 % cream, APPLY AS DIRECTED TO AFFECTED AREA TWICE DAILY AS NEEDED, Disp: , Rfl:  .  Lancet Devices (SIMPLE DIAGNOSTICS LANCING DEV) MISC, Use 1 each as directed. Accu Chek Softclix. DX: E11.8, Disp: , Rfl:  .  levocetirizine (XYZAL) 5 MG tablet, Take 5 mg by mouth every evening. , Disp: , Rfl:  .  levothyroxine (SYNTHROID, LEVOTHROID) 200 MCG tablet, Take on an empty stomach each morning before breakfast. Total dose 250 mcg daily., Disp: , Rfl:  .  levothyroxine (SYNTHROID, LEVOTHROID) 50 MCG tablet, Take 50 mcg by mouth daily before breakfast. , Disp: , Rfl:  .  magnesium oxide (MAG-OX) 400 MG tablet, Take 1 tablet (400 mg total) by mouth 2 (two) times daily., Disp: 60 tablet, Rfl: 2 .  medroxyPROGESTERone (DEPO-PROVERA) 150 MG/ML injection, Inject 150 mg into the muscle every 3 (three) months. , Disp: , Rfl:  .  metFORMIN (GLUCOPHAGE) 1000 MG tablet, Take 1,000 mg by mouth 2 (two) times daily with a meal. , Disp: , Rfl:  .  metoprolol (LOPRESSOR) 50 MG tablet, Take 75 mg by mouth 2 (two) times daily. , Disp: , Rfl:  .  montelukast (SINGULAIR) 10 MG tablet, Take 10 mg by mouth at bedtime. , Disp: , Rfl:  .  Multiple Vitamin (MULTIVITAMIN WITH MINERALS) TABS tablet, Take 2 tablets by mouth daily. Reported on 11/12/2015, Disp: , Rfl:  .  naloxone (NARCAN) nasal spray 4 mg/0.1 mL, Spray into one nostril. Repeat with second device into other nostril after 2-3 minutes if no or minimal response., Disp: 1 kit, Rfl: 0 .  omeprazole (PRILOSEC)  40 MG capsule, Take 40 mg by mouth 2 (two) times daily. , Disp: , Rfl:  .  oxyCODONE (OXY IR/ROXICODONE) 5 MG immediate release tablet, Take 1 tablet (5 mg total) by mouth 5 (five) times daily as needed for severe pain., Disp: 150 tablet, Rfl: 0 .  oxyCODONE (OXY IR/ROXICODONE) 5 MG immediate release tablet, Take 1 tablet (5 mg total) by mouth 5 (five) times daily as needed for severe pain., Disp: 150 tablet, Rfl: 0 .  oxyCODONE (OXY IR/ROXICODONE) 5 MG immediate release tablet, Take 1 tablet (5 mg total) by mouth 5 (five) times daily as needed for severe pain., Disp: 150 tablet, Rfl: 0 .  QUEtiapine (SEROQUEL) 100 MG tablet, Take 1 tablet (100 mg total) by mouth at bedtime., Disp: 90 tablet, Rfl: 0 .  ranitidine (ZANTAC) 150 MG capsule, Take by mouth., Disp: , Rfl:  .  rosuvastatin (CRESTOR) 40 MG tablet, Take 40 mg by mouth daily. , Disp: , Rfl:  .  sertraline (ZOLOFT) 100 MG tablet, Take 2 tablets (200 mg total) by mouth daily., Disp: 180 tablet, Rfl: 2 .  spironolactone (ALDACTONE) 25 MG tablet, Take 37.5 mg by mouth 2 (two) times daily. , Disp: , Rfl:  .  tiZANidine (ZANAFLEX) 4 MG tablet, 4 mg at bedtime. , Disp: , Rfl:  .  TRUE METRIX BLOOD GLUCOSE TEST test strip, , Disp: , Rfl:  .  ULTICARE ALCOHOL SWABS PADS, Apply 1 each topically 2 (two) times daily. Use when checking BG. DX: E11.8, Disp: , Rfl:  .  vitamin B-12 (CYANOCOBALAMIN) 1000 MCG tablet, Take by mouth., Disp: , Rfl:  .  Vitamin D, Ergocalciferol, (DRISDOL) 50000 units CAPS capsule,  Take 1 capsule (50,000 Units total) by mouth 2 (two) times a week. X 6 weeks., Disp: 12 capsule, Rfl: 0 .  zonisamide (ZONEGRAN) 50 MG capsule, Take 150 mg by mouth daily. 3 tabs at bedtime, Disp: , Rfl:  .  acyclovir (ZOVIRAX) 800 MG tablet, Take 800 mg by mouth 3 times/day as needed-between meals & bedtime. Reported on 11/12/2015, Disp: , Rfl:  .  butalbital-acetaminophen-caffeine (FIORICET, ESGIC) 50-325-40 MG tablet, 1 tablet every 6 (six) hours as  needed. , Disp: , Rfl:  .  ferrous sulfate 325 (65 FE) MG tablet, Take 325 mg by mouth daily with breakfast., Disp: , Rfl:  .  guaiFENesin-codeine 100-10 MG/5ML syrup, Take by mouth., Disp: , Rfl:  .  ipratropium-albuterol (DUONEB) 0.5-2.5 (3) MG/3ML SOLN, 3 mLs as needed. , Disp: , Rfl:  .  meclizine (ANTIVERT) 25 MG tablet, Take 1 tablet (25 mg total) by mouth 3 (three) times daily as needed for dizziness. (Patient not taking: Reported on 07/21/2017), Disp: 30 tablet, Rfl: 0 .  phentermine 15 MG capsule, Take 37.5 mg by mouth. , Disp: , Rfl:  .  pregabalin (LYRICA) 100 MG capsule, Take 1 capsule (100 mg total) by mouth 3 (three) times daily. (Patient not taking: Reported on 07/21/2017), Disp: 270 capsule, Rfl: 0 .  ranitidine (ZANTAC) 150 MG capsule, Take 150 mg by mouth every evening. , Disp: , Rfl:  .  triamcinolone (KENALOG) 0.1 % paste, Apply 1 application topically 2 (two) times daily. , Disp: , Rfl:  .  triamcinolone (NASACORT AQ) 55 MCG/ACT AERO nasal inhaler, 2 sprays by Nasal route 2 (two) times daily., Disp: , Rfl:   ROS  Constitutional: Denies any fever or chills Gastrointestinal: No reported hemesis, hematochezia, vomiting, or acute GI distress Musculoskeletal: Denies any acute onset joint swelling, redness, loss of ROM, or weakness Neurological: No reported episodes of acute onset apraxia, aphasia, dysarthria, agnosia, amnesia, paralysis, loss of coordination, or loss of consciousness  Allergies  Ms. Omura is allergic to bactrim [sulfamethoxazole-trimethoprim]; xolair [omalizumab]; ciprofloxacin; shellfish allergy; aspirin; clindamycin; motrin [ibuprofen]; nsaids; other; and sulfa antibiotics.  [place]  Drug: Ms. Mertz  reports that she does not use drugs. Alcohol:  reports that she does not drink alcohol. Tobacco:  reports that  has never smoked. she has never used smokeless tobacco. Medical:  has a past medical history of Anemia, Anginal pain ([place]), Anxiety, Arthralgia of hip  (07/29/2015), Arthritis, Arthritis, degenerative (07/29/2015), Asthma, Cephalalgia (07/25/2014), Dependence on unknown drug ([place]), Depression, Diabetes mellitus without complication ([place]), Dysrhythmia, Eczema, Fibromyalgia, Gastritis, GERD (gastroesophageal reflux disease), Gonalgia (07/29/2015), Gout, H/O cardiovascular disorder (03/10/2015), H/O surgical procedure (12/05/2012), H/O thyroid disease (03/10/2015), Headache, Herpes, History of artificial joint (07/29/2015), History of hiatal hernia, Hypertension, Hypomagnesemia, Hypothyroidism, LBP (low back pain) (07/29/2015), Neuromuscular disorder ([place]), Obesity, PCOS (polycystic ovarian syndrome), Primary osteoarthritis of both knees (07/29/2015), Sleep apnea, and Thyroid nodule (bilateral). Surgical: Ms. Luton  has a past surgical history that includes Laparoscopic partial gastrectomy; Shoulder arthroscopy (Right); Joint replacement (Left, hip); Carpal tunnel release (Bilateral); Diagnostic laparoscopy; Cholecystectomy; Trigger finger release (Right); left trigger finger; Roux-en-Y Gastric Bypass (06/03/2017); Hiatal hernia repair; and THYROIDECTOMY (N/A, 11/12/2015). Family: family history includes Alcohol abuse in her father and mother; Anxiety disorder in her father and mother; Breast cancer in her paternal aunt; COPD in her father; Depression in her brother, father, and mother; Diabetes in her brother, father, and mother; Hypertension in her brother, father, and mother; Kidney cancer in her mother; Kidney failure in her father; Post-traumatic  stress disorder in her father; Sleep apnea in her brother, father, and mother.  Constitutional Exam  General appearance: Well nourished, well developed, and well hydrated. In no apparent acute distress Vitals:   07/21/17 1121  BP: 135/65  Pulse: 94  Resp: 18  Temp: 99.1 F (37.3 C)  TempSrc: Oral  SpO2: 99%  Weight: (!) 386 lb (175.1 kg)  Height: '5\' 4"'  (1.626 m)   BMI Assessment: Estimated body mass index is  66.26 kg/m as calculated from the following:   Height as of this encounter: '5\' 4"'  (1.626 m).   Weight as of this encounter: 386 lb (175.1 kg).  Psych/Mental status: Alert, oriented x 3 (person, place, & time)       Eyes: PERLA Respiratory: No evidence of acute respiratory distress  Cervical Spine Area Exam  Skin & Axial Inspection: No masses, redness, edema, swelling, or associated skin lesions Alignment: Symmetrical Functional ROM: Unrestricted ROM      Stability: No instability detected Muscle Tone/Strength: Functionally intact. No obvious neuro-muscular anomalies detected. Sensory (Neurological): Unimpaired Palpation: No palpable anomalies              Upper Extremity (UE) Exam    Side: Right upper extremity  Side: Left upper extremity  Skin & Extremity Inspection: Skin color, temperature, and hair growth are WNL. No peripheral edema or cyanosis. No masses, redness, swelling, asymmetry, or associated skin lesions. No contractures.  Skin & Extremity Inspection: Skin color, temperature, and hair growth are WNL. No peripheral edema or cyanosis. No masses, redness, swelling, asymmetry, or associated skin lesions. No contractures.  Functional ROM: Unrestricted ROM          Functional ROM: Unrestricted ROM          Muscle Tone/Strength: Functionally intact. No obvious neuro-muscular anomalies detected.  Muscle Tone/Strength: Functionally intact. No obvious neuro-muscular anomalies detected.  Sensory (Neurological): Unimpaired          Sensory (Neurological): Unimpaired          Palpation: No palpable anomalies              Palpation: No palpable anomalies              Specialized Test(s): Deferred         Specialized Test(s): Deferred          Thoracic Spine Area Exam  Skin & Axial Inspection: No masses, redness, or swelling Alignment: Symmetrical Functional ROM: Unrestricted ROM Stability: No instability detected Muscle Tone/Strength: Functionally intact. No obvious neuro-muscular  anomalies detected. Sensory (Neurological): Unimpaired Muscle strength & Tone: No palpable anomalies  Lumbar Spine Area Exam  Skin & Axial Inspection: No masses, redness, or swelling Alignment: Symmetrical Functional ROM: Unrestricted ROM      Stability: No instability detected Muscle Tone/Strength: Functionally intact. No obvious neuro-muscular anomalies detected. Sensory (Neurological): Unimpaired Palpation: Complains of area being tender to palpation       Provocative Tests: Lumbar Hyperextension and rotation test: Positive       with standing up straight more than bending.  Lumbar Lateral bending test: evaluation deferred today       Patrick's Maneuver: evaluation deferred today                    Gait & Posture Assessment  Ambulation: Patient ambulates using a wheel chair Gait: Very limited, using assistive device to ambulate Posture: WNL   Lower Extremity Exam    Side: Right lower extremity  Side: Left lower extremity  Skin &  Extremity Inspection: Skin color, temperature, and hair growth are WNL. No peripheral edema or cyanosis. No masses, redness, swelling, asymmetry, or associated skin lesions. No contractures.  Skin & Extremity Inspection: Skin color, temperature, and hair growth are WNL. No peripheral edema or cyanosis. No masses, redness, swelling, asymmetry, or associated skin lesions. No contractures.  Functional ROM: Unrestricted ROM          Functional ROM: Unrestricted ROM          Muscle Tone/Strength: Functionally intact. No obvious neuro-muscular anomalies detected.  Muscle Tone/Strength: Functionally intact. No obvious neuro-muscular anomalies detected.  Sensory (Neurological): Unimpaired  Sensory (Neurological): Unimpaired  Palpation: No palpable anomalies  Palpation: No palpable anomalies   Assessment  Primary Diagnosis & Pertinent Problem List: The primary encounter diagnosis was Chronic low back pain [facility] source of pain) (Bilateral) (R>L). Diagnoses of  Chronic hip pain (Secondary source of pain) (Right), Lumbar facet syndrome (Bilateral) (R>L), and Lumbar spondylosis were also pertinent to this visit.  Status Diagnosis  Recurring Controlled Recurring 1. Chronic low back pain [facility] source of pain) (Bilateral) (R>L)   2. Chronic hip pain (Secondary source of pain) (Right)   3. Lumbar facet syndrome (Bilateral) (R>L)   4. Lumbar spondylosis     Problems updated and reviewed during this visit: No problems updated. Plan of Care  Pharmacotherapy (Medications Ordered): No orders of the defined types were placed in this encounter. This SmartLink is deprecated. Use AVSMEDLIST instead to display the medication list for a patient. Medications administered today: Ife A. Klute had no medications administered during this visit. Lab-work, procedure(s), and/or referral(s): Orders Placed This Encounter  Procedures  . LUMBAR FACET(MEDIAL BRANCH NERVE BLOCK) MBNB   Imaging and/or referral(s): None  Interventional therapies: Planned, scheduled, and/or pending:  Palliative bilateral lumbar facet block    Considering:  Series of 5 Hyalgan knee injections    Palliative PRN treatment(s):  Intra-articular Hyalgan knee injections.  Palliative bilateral genicular nerve block  Palliative bilateral lumbar facet block       Provider-requested follow-up: Return for w/ Dr. [name], Procedure(w/Sedation), (ASAA).  Future Appointments  Date Time Provider Ray  08/15/2017 10:20 AM Gae Dry, MD [facility] None  08/16/2017 11:00 AM Vevelyn Francois, NP [facility] None   Primary Care Physician: Idelle Crouch, MD Location: [facility] Note by: Vevelyn Francois NP Date: 07/21/2017; Time: 2:24 PM  Pain Score Disclaimer: We use the NRS-11 scale. This is a self-reported, subjective measurement of pain severity with only modest accuracy. It is used primarily to identify changes within a particular  patient. It must be understood that outpatient pain scales are significantly less accurate that those used for research, where they can be applied under ideal controlled circumstances with minimal exposure to variables. In reality, the score is likely to be a combination of pain intensity and pain affect, where pain affect describes the degree of emotional arousal or changes in action readiness caused by the sensory experience of pain. Factors such as social and work situation, setting, emotional state, anxiety levels, expectation, and prior pain experience may influence pain perception and show large inter-individual differences that may also be affected by time variables.  Patient instructions provided during this appointment: Patient Instructions    BMI Assessment: Estimated body mass index is 66.26 kg/m as calculated from the following:   Height as of this encounter: '5\' 4"'  (1.626 m).   Weight as of this encounter: 386 lb (175.1 kg).  BMI interpretation table:  BMI level Category Range association with higher incidence of chronic pain  <18 kg/m2 Underweight   18.5-24.9 kg/m2 Ideal body weight   25-29.9 kg/m2 Overweight Increased incidence by 20%  30-34.9 kg/m2 Obese (Class I) Increased incidence by 68%  35-39.9 kg/m2 Severe obesity (Class II) Increased incidence by 136%  >40 kg/m2 Extreme obesity (Class III) Increased incidence by 254%   BMI Readings from Last 4 Encounters:  07/21/17 66.26 kg/m  07/07/17 66.43 kg/m  06/29/17 67.63 kg/m  06/21/17 66.77 kg/m   Wt Readings from Last 4 Encounters:  07/21/17 (!) 386 lb (175.1 kg)  07/07/17 (!) 387 lb (175.5 kg)  06/29/17 (!) 394 lb (178.7 kg)  06/21/17 (!) 389 lb (176.4 kg)

## 2017-08-15 ENCOUNTER — Encounter: Payer: Self-pay | Admitting: Obstetrics & Gynecology

## 2017-08-15 ENCOUNTER — Ambulatory Visit (INDEPENDENT_AMBULATORY_CARE_PROVIDER_SITE_OTHER): Payer: Medicare HMO | Admitting: Obstetrics & Gynecology

## 2017-08-15 VITALS — BP 138/80 | HR 87 | Ht 65.0 in | Wt 380.0 lb

## 2017-08-15 DIAGNOSIS — Z0000 Encounter for general adult medical examination without abnormal findings: Secondary | ICD-10-CM

## 2017-08-15 DIAGNOSIS — Z01419 Encounter for gynecological examination (general) (routine) without abnormal findings: Secondary | ICD-10-CM | POA: Diagnosis not present

## 2017-08-15 MED ORDER — MEDROXYPROGESTERONE ACETATE 150 MG/ML IM SUSP: 150.0000 mg | INTRAMUSCULAR | 3 refills | Status: DC

## 2017-08-15 MED ORDER — MEDROXYPROGESTERONE ACETATE 150 MG/ML IM SUSP
150.0000 mg | INTRAMUSCULAR | 3 refills | Status: DC
Start: 2017-08-15 — End: 2017-08-15

## 2017-08-15 NOTE — Addendum Note (Signed)
Addended by: Gae Dry on: 08/15/2017 11:04 AM   Modules accepted: Orders

## 2017-08-15 NOTE — Progress Notes (Signed)
HPI:      [name] is a 46 y.o. G0P0000 who LMP was No LMP recorded. Patient has had an injection., she presents today for her annual examination. The patient has no complaints today. The patient is sexually active. Her last pap: approximate date 2017 and was normal and last mammogram: approximate date 06/2017 and was normal. The patient does perform self breast exams.  There is notable family history of breast or ovarian cancer in her family.  The patient has regular exercise: no.  The patient denies current symptoms of depression.    GYN History: Contraception: Depo-Provera injections  PMHx: Past Medical History:  Diagnosis Date  . Anemia   . Anginal pain ([place])   . Anxiety   . Arthralgia of hip 07/29/2015  . Arthritis   . Arthritis, degenerative 07/29/2015  . Asthma   . Cephalalgia 07/25/2014  . Dependence on unknown drug ([place])    multiplt controlled drug dependence  . Depression   . Diabetes mellitus without complication ([place])   . Dysrhythmia   . Eczema   . Fibromyalgia   . Gastritis   . GERD (gastroesophageal reflux disease)   . Gonalgia 07/29/2015   Overview:  Overview:  The patient has had bilateral intra-articular Hyalgan injections done on 07/16/2014 and although she seems to do well with this type of therapy, apparently her insurance company does not want to pay for they Hyalgan. On 11/27/2014 the patient underwent a bilateral genicular nerve block with excellent results. On 01/28/2015 she had a right knee genicular radiofrequency ablatio  . Gout   . H/O cardiovascular disorder 03/10/2015  . H/O surgical procedure 12/05/2012   Overview:  LSG ([facility] - April 2013)    . H/O thyroid disease 03/10/2015  . Headache   . Herpes   . History of artificial joint 07/29/2015  . History of hiatal hernia   . Hypertension   . Hypomagnesemia   . Hypothyroidism   . LBP (low back pain) 07/29/2015  . Neuromuscular disorder ([place])   . Obesity   . PCOS (polycystic ovarian  syndrome)   . Primary osteoarthritis of both knees 07/29/2015  . Sleep apnea   . Thyroid nodule bilateral   Past Surgical History:  Procedure Laterality Date  . CARPAL TUNNEL RELEASE Bilateral   . CHOLECYSTECTOMY    . DIAGNOSTIC LAPAROSCOPY    . HIATAL HERNIA REPAIR    . JOINT REPLACEMENT Left hip  . LAPAROSCOPIC PARTIAL GASTRECTOMY    . left trigger finger    . ROUX-EN-Y GASTRIC BYPASS  06/03/2017  . SHOULDER ARTHROSCOPY Right   . THYROIDECTOMY N/A 11/12/2015   Procedure: THYROIDECTOMY;  Surgeon: Clyde Canterbury, MD;  Location: [facility] ORS;  Service: ENT;  Laterality: N/A;  . TRIGGER FINGER RELEASE Right    Family History  Problem Relation Age of Onset  . Anxiety disorder Mother   . Depression Mother   . Alcohol abuse Mother   . Diabetes Mother   . Hypertension Mother   . Kidney cancer Mother   . Sleep apnea Mother   . Alcohol abuse Father   . Anxiety disorder Father   . Depression Father   . Post-traumatic stress disorder Father   . Kidney failure Father   . COPD Father   . Diabetes Father   . Hypertension Father   . Sleep apnea Father   . Depression Brother   . Diabetes Brother   . Hypertension Brother   . Sleep apnea Brother   .  Breast cancer Paternal Aunt   . Bladder Cancer Neg Hx   . Prostate cancer Neg Hx    Social History   Tobacco Use  . Smoking status: Never Smoker  . Smokeless tobacco: Never Used  Substance Use Topics  . Alcohol use: No    Alcohol/week: 0.0 oz  . Drug use: No    Current Outpatient Medications:  .  Acetaminophen 500 MG coapsule, Take 500 mg by mouth every 6 (six) hours as needed. , Disp: , Rfl:  .  acyclovir (ZOVIRAX) 800 MG tablet, Take 800 mg by mouth 3 times/day as needed-between meals & bedtime. Reported on 11/12/2015, Disp: , Rfl:  .  albuterol (PROVENTIL HFA;VENTOLIN HFA) 108 (90 BASE) MCG/ACT inhaler, Inhale 2 puffs into the lungs every 6 (six) hours as needed for wheezing or shortness of breath., Disp: , Rfl:  .  amitriptyline  (ELAVIL) 100 MG tablet, Take 100 mg by mouth at bedtime. , Disp: , Rfl:  .  Blood Glucose Calibration (ACCU-CHEK AVIVA) SOLN, , Disp: , Rfl:  .  Blood Glucose Monitoring Suppl (ACCU-CHEK AVIVA PLUS) w/Device KIT, , Disp: , Rfl:  .  butalbital-acetaminophen-caffeine (FIORICET, ESGIC) 50-325-40 MG tablet, 1 tablet every 6 (six) hours as needed. , Disp: , Rfl:  .  ferrous sulfate 325 (65 FE) MG tablet, Take 325 mg by mouth daily with breakfast., Disp: , Rfl:  .  gabapentin (NEURONTIN) 800 MG tablet, Take 1 tablet (800 mg total) by mouth every 6 (six) hours. (Patient taking differently: Take 1,600 mg by mouth 2 (two) times daily. ), Disp: 360 tablet, Rfl: 0 .  guaiFENesin-codeine 100-10 MG/5ML syrup, Take by mouth., Disp: , Rfl:  .  ipratropium-albuterol (DUONEB) 0.5-2.5 (3) MG/3ML SOLN, 3 mLs as needed. , Disp: , Rfl:  .  ketoconazole (NIZORAL) 2 % cream, APPLY AS DIRECTED TO AFFECTED AREA TWICE DAILY AS NEEDED, Disp: , Rfl:  .  Lancet Devices (SIMPLE DIAGNOSTICS LANCING DEV) MISC, Use 1 each as directed. Accu Chek Softclix. DX: E11.8, Disp: , Rfl:  .  levocetirizine (XYZAL) 5 MG tablet, Take 5 mg by mouth every evening. , Disp: , Rfl:  .  levothyroxine (SYNTHROID, LEVOTHROID) 200 MCG tablet, Take on an empty stomach each morning before breakfast. Total dose 250 mcg daily., Disp: , Rfl:  .  levothyroxine (SYNTHROID, LEVOTHROID) 50 MCG tablet, Take 50 mcg by mouth daily before breakfast. , Disp: , Rfl:  .  magnesium oxide (MAG-OX) 400 MG tablet, Take 1 tablet (400 mg total) by mouth 2 (two) times daily., Disp: 60 tablet, Rfl: 2 .  meclizine (ANTIVERT) 25 MG tablet, Take 1 tablet (25 mg total) by mouth 3 (three) times daily as needed for dizziness. (Patient not taking: Reported on 07/21/2017), Disp: 30 tablet, Rfl: 0 .  medroxyPROGESTERone (DEPO-PROVERA) 150 MG/ML injection, Inject 1 mL (150 mg total) into the muscle every 3 (three) months., Disp: 150 mL, Rfl: 3 .  metFORMIN (GLUCOPHAGE) 1000 MG tablet,  Take 1,000 mg by mouth 2 (two) times daily with a meal. , Disp: , Rfl:  .  metoprolol (LOPRESSOR) 50 MG tablet, Take 75 mg by mouth 2 (two) times daily. , Disp: , Rfl:  .  montelukast (SINGULAIR) 10 MG tablet, Take 10 mg by mouth at bedtime. , Disp: , Rfl:  .  Multiple Vitamin (MULTIVITAMIN WITH MINERALS) TABS tablet, Take 2 tablets by mouth daily. Reported on 11/12/2015, Disp: , Rfl:  .  naloxone (NARCAN) nasal spray 4 mg/0.1 mL, Spray into one nostril. Repeat  with second device into other nostril after 2-3 minutes if no or minimal response., Disp: 1 kit, Rfl: 0 .  omeprazole (PRILOSEC) 40 MG capsule, Take 40 mg by mouth 2 (two) times daily. , Disp: , Rfl:  .  oxyCODONE (OXY IR/ROXICODONE) 5 MG immediate release tablet, Take 1 tablet (5 mg total) by mouth 5 (five) times daily as needed for severe pain., Disp: 150 tablet, Rfl: 0 .  oxyCODONE (OXY IR/ROXICODONE) 5 MG immediate release tablet, Take 1 tablet (5 mg total) by mouth 5 (five) times daily as needed for severe pain., Disp: 150 tablet, Rfl: 0 .  oxyCODONE (OXY IR/ROXICODONE) 5 MG immediate release tablet, Take 1 tablet (5 mg total) by mouth 5 (five) times daily as needed for severe pain., Disp: 150 tablet, Rfl: 0 .  phentermine 15 MG capsule, Take 37.5 mg by mouth. , Disp: , Rfl:  .  pregabalin (LYRICA) 100 MG capsule, Take 1 capsule (100 mg total) by mouth 3 (three) times daily. (Patient not taking: Reported on 07/21/2017), Disp: 270 capsule, Rfl: 0 .  QUEtiapine (SEROQUEL) 100 MG tablet, Take 1 tablet (100 mg total) by mouth at bedtime., Disp: 90 tablet, Rfl: 0 .  ranitidine (ZANTAC) 150 MG capsule, Take 150 mg by mouth every evening. , Disp: , Rfl:  .  ranitidine (ZANTAC) 150 MG capsule, Take by mouth., Disp: , Rfl:  .  rosuvastatin (CRESTOR) 40 MG tablet, Take 40 mg by mouth daily. , Disp: , Rfl:  .  sertraline (ZOLOFT) 100 MG tablet, Take 2 tablets (200 mg total) by mouth daily., Disp: 180 tablet, Rfl: 2 .  spironolactone (ALDACTONE) 25 MG  tablet, Take 37.5 mg by mouth 2 (two) times daily. , Disp: , Rfl:  .  tiZANidine (ZANAFLEX) 4 MG tablet, 4 mg at bedtime. , Disp: , Rfl:  .  triamcinolone (KENALOG) 0.1 % paste, Apply 1 application topically 2 (two) times daily. , Disp: , Rfl:  .  triamcinolone (NASACORT AQ) 55 MCG/ACT AERO nasal inhaler, 2 sprays by Nasal route 2 (two) times daily., Disp: , Rfl:  .  TRUE METRIX BLOOD GLUCOSE TEST test strip, , Disp: , Rfl:  .  ULTICARE ALCOHOL SWABS PADS, Apply 1 each topically 2 (two) times daily. Use when checking BG. DX: E11.8, Disp: , Rfl:  .  vitamin B-12 (CYANOCOBALAMIN) 1000 MCG tablet, Take by mouth., Disp: , Rfl:  .  Vitamin D, Ergocalciferol, (DRISDOL) 50000 units CAPS capsule, Take 1 capsule (50,000 Units total) by mouth 2 (two) times a week. X 6 weeks., Disp: 12 capsule, Rfl: 0 .  zonisamide (ZONEGRAN) 50 MG capsule, Take 150 mg by mouth daily. 3 tabs at bedtime, Disp: , Rfl:  Allergies: Bactrim [sulfamethoxazole-trimethoprim]; Xolair [omalizumab]; Ciprofloxacin; Shellfish allergy; Aspirin; Clindamycin; Motrin [ibuprofen]; Nsaids; Other; and Sulfa antibiotics  Review of Systems  Constitutional: Negative for chills, fever and malaise/fatigue.  HENT: Negative for congestion, sinus pain and sore throat.   Eyes: Negative for blurred vision and pain.  Respiratory: Negative for cough and wheezing.   Cardiovascular: Negative for chest pain and leg swelling.  Gastrointestinal: Negative for abdominal pain, constipation, diarrhea, heartburn, nausea and vomiting.  Genitourinary: Negative for dysuria, frequency, hematuria and urgency.  Musculoskeletal: Negative for back pain, joint pain, myalgias and neck pain.  Skin: Negative for itching and rash.  Neurological: Negative for dizziness, tremors and weakness.  Endo/Heme/Allergies: Does not bruise/bleed easily.  Psychiatric/Behavioral: Negative for depression. The patient is not nervous/anxious and does not have insomnia.  Objective: BP  138/80   Pulse 87   Ht '5\' 5"'  (1.651 m)   Wt (!) 380 lb (172.4 kg)   BMI 63.24 kg/m   Filed Weights   08/15/17 1022  Weight: (!) 380 lb (172.4 kg)   Body mass index is 63.24 kg/m. Physical Exam  Constitutional: She is oriented to person, place, and time. She appears well-developed and well-nourished. No distress.  Genitourinary: Rectum normal, vagina normal and uterus normal. Pelvic exam was performed with patient supine. There is no rash or lesion on the right labia. There is no rash or lesion on the left labia. Vagina exhibits no lesion. No bleeding in the vagina. Right adnexum does not display mass and does not display tenderness. Left adnexum does not display mass and does not display tenderness. Cervix does not exhibit motion tenderness, lesion, friability or polyp.   Uterus is mobile and midaxial. Uterus is not enlarged or exhibiting a mass.  HENT:  Head: Normocephalic and atraumatic. Head is without laceration.  Right Ear: Hearing normal.  Left Ear: Hearing normal.  Nose: No epistaxis.  No foreign bodies.  Mouth/Throat: Uvula is midline, oropharynx is clear and moist and mucous membranes are normal.  Eyes: Pupils are equal, round, and reactive to light.  Neck: Normal range of motion. Neck supple. No thyromegaly present.  Cardiovascular: Normal rate and regular rhythm. Exam reveals no gallop and no friction rub.  No murmur heard. Pulmonary/Chest: Effort normal and breath sounds normal. No respiratory distress. She has no wheezes. Right breast exhibits no mass, no skin change and no tenderness. Left breast exhibits no mass, no skin change and no tenderness.  Abdominal: Soft. Bowel sounds are normal. She exhibits no distension. There is no tenderness. There is no rebound.  Musculoskeletal: Normal range of motion.  Neurological: She is alert and oriented to person, place, and time. No cranial nerve deficit.  Skin: Skin is warm and dry.  Psychiatric: She has a normal mood and affect.  Judgment normal.  Vitals reviewed.   Assessment:  ANNUAL EXAM 1. Annual physical exam   2. Morbid obesity ([place])      Screening Plan:            1.  Cervical Screening-  Pap smear schedule reviewed with patient  2. Breast screening- Exam annually and mammogram>40 planned  MMG done last month, normal  3. Colonoscopy every 10 years, Hemoccult testing - after age 46  4. Labs managed by PCP  5. Counseling for contraception: Depo-Provera  Other:  1. Annual physical exam  2. Morbid obesity ([place]) Recently had Gastric Sleeve surgery and is losing weight (max weight for her was 550 lbs).    3. Preconceptual counseling- desires to try for pregnancy after loses weight; understands age and PCOS may preclude chances.  Will stop Depo now to allow natural state of hormones to normalize.     F/U  Return in about 1 year (around 08/15/2018) for Annual.  Barnett Applebaum, MD, [facility], [facility] 08/15/2017  11:00 AM

## 2017-08-15 NOTE — Patient Instructions (Signed)
PAP every three years Mammogram every year Labs yearly (with PCP) 

## 2017-08-16 ENCOUNTER — Encounter: Payer: Self-pay | Admitting: Pain Medicine

## 2017-08-16 ENCOUNTER — Encounter: Payer: Medicare HMO | Admitting: Nurse Practitioner

## 2017-08-16 ENCOUNTER — Ambulatory Visit (HOSPITAL_BASED_OUTPATIENT_CLINIC_OR_DEPARTMENT_OTHER): Payer: Medicare HMO | Admitting: Pain Medicine

## 2017-08-16 ENCOUNTER — Other Ambulatory Visit: Payer: Self-pay

## 2017-08-16 ENCOUNTER — Ambulatory Visit
Admission: RE | Admit: 2017-08-16 | Discharge: 2017-08-16 | Disposition: A | Discharge status: Home / Self Care | Payer: Medicare HMO | Source: Ambulatory Visit | Attending: Pain Medicine | Admitting: Pain Medicine

## 2017-08-16 VITALS — BP 99/68 | HR 96 | Temp 98.7°F | Resp 16 | Ht 64.0 in | Wt 380.0 lb

## 2017-08-16 DIAGNOSIS — G8929 Other chronic pain: Secondary | ICD-10-CM

## 2017-08-16 DIAGNOSIS — M545 Low back pain: Secondary | ICD-10-CM | POA: Insufficient documentation

## 2017-08-16 DIAGNOSIS — M4310 Spondylolisthesis, site unspecified: Secondary | ICD-10-CM | POA: Diagnosis not present

## 2017-08-16 DIAGNOSIS — M47816 Spondylosis without myelopathy or radiculopathy, lumbar region: Secondary | ICD-10-CM | POA: Insufficient documentation

## 2017-08-16 MED ORDER — TRIAMCINOLONE ACETONIDE 40 MG/ML IJ SUSP
40.0000 mg | Freq: Once | INTRAMUSCULAR | Status: AC
  Administered 2017-08-16: 40 mg
  Filled 2017-08-16: qty 1

## 2017-08-16 MED ORDER — ROPIVACAINE HCL 2 MG/ML IJ SOLN
9.0000 mL | Freq: Once | INTRAMUSCULAR | Status: AC
  Administered 2017-08-16: 10 mL via PERINEURAL
  Filled 2017-08-16: qty 10

## 2017-08-16 MED ORDER — LACTATED RINGERS IV SOLN
1000.0000 mL | Freq: Once | INTRAVENOUS | Status: AC
  Administered 2017-08-16: 1000 mL via INTRAVENOUS

## 2017-08-16 MED ORDER — LIDOCAINE HCL 2 % IJ SOLN
10.0000 mL | Freq: Once | INTRAMUSCULAR | Status: AC
  Administered 2017-08-16: 400 mg
  Filled 2017-08-16: qty 20

## 2017-08-16 MED ORDER — MIDAZOLAM HCL 5 MG/5ML IJ SOLN
1.0000 mg | INTRAMUSCULAR | Status: DC | PRN
  Administered 2017-08-16: 4 mg via INTRAVENOUS
  Filled 2017-08-16: qty 5

## 2017-08-16 MED ORDER — FENTANYL CITRATE (PF) 100 MCG/2ML IJ SOLN
25.0000 ug | INTRAMUSCULAR | Status: DC | PRN
  Administered 2017-08-16: 100 ug via INTRAVENOUS
  Filled 2017-08-16: qty 2

## 2017-08-16 NOTE — Progress Notes (Signed)
Patient's Name: Karen Lewis  MRN: [id number]  Referring Provider: Vevelyn Francois, NP  DOB: April 25, 1971  PCP: Idelle Crouch, MD  DOS: 08/16/2017  Note by: Gaspar Cola, MD  Service setting: Ambulatory outpatient  Specialty: Interventional Pain Management  Patient type: Established  Location: [facility] (AMB) Pain Management Facility  Visit type: Interventional Procedure   Primary Reason for Visit: Interventional Pain Management Treatment. CC: Back Pain (mid to lower)  Procedure:  Anesthesia, Analgesia, Anxiolysis:  Type: Diagnostic Medial Branch Facet Block Region: Lumbar Level: L2, L3, L4, L5, & S1 Medial Branch Level(s) Laterality: Bilateral  Type: Local Anesthesia with Moderate (Conscious) Sedation Local Anesthetic: Lidocaine 1% Route: Intravenous (IV) IV Access: Secured Sedation: Meaningful verbal contact was maintained at all times during the procedure  Indication(s): Analgesia and Anxiety   Indications: 1. Chronic low back pain [facility] source of pain) (Bilateral) (R>L)   2. Lumbar facet syndrome (Bilateral) (R>L)   3. Lumbar spondylosis   4. Grade 1 (1.4 cm) Anterolisthesis of L4 over L5    Pain Score: Pre-procedure: 6 /10 Post-procedure: 0-No pain/10  Pre-op Assessment:  Karen Lewis is a 46 y.o. (year old), female patient, seen today for interventional treatment. She  has a past surgical history that includes Laparoscopic partial gastrectomy; Shoulder arthroscopy (Right); Joint replacement (Left, hip); Carpal tunnel release (Bilateral); Diagnostic laparoscopy; Cholecystectomy; Trigger finger release (Right); Thyroidectomy (N/A, 11/12/2015); left trigger finger; Roux-en-Y Gastric Bypass (06/03/2017); and Hiatal hernia repair. Karen Lewis has a current medication list which includes the following prescription(s): acetaminophen, acyclovir, albuterol, accu-chek aviva, accu-chek aviva plus, gabapentin, guaifenesin-codeine, ipratropium-albuterol, ketoconazole, simple diagnostics lancing  dev, levocetirizine, levothyroxine, levothyroxine, magnesium oxide, meclizine, medroxyprogesterone, metformin, metoprolol tartrate, montelukast, multivitamin with minerals, naloxone, omeprazole, quetiapine, ranitidine, rosuvastatin, sertraline, spironolactone, tizanidine, triamcinolone, triamcinolone, true metrix blood glucose test, ulticare alcohol swabs, vitamin b-12, vitamin d (ergocalciferol), zonisamide, butalbital-acetaminophen-caffeine, oxycodone, oxycodone, oxycodone, phentermine, and ranitidine, and the following Facility-Administered Medications: fentanyl and midazolam. Her primarily concern today is the Back Pain (mid to lower)  Initial Vital Signs: There were no vitals taken for this visit. BMI: Estimated body mass index is 65.23 kg/m as calculated from the following:   Height as of this encounter: 5\' 4"  (1.626 m).   Weight as of this encounter: 380 lb (172.4 kg).  Risk Assessment: Allergies: Reviewed. She is allergic to bactrim [sulfamethoxazole-trimethoprim]; xolair [omalizumab]; ciprofloxacin; shellfish allergy; aspirin; clindamycin; motrin [ibuprofen]; nsaids; other; and sulfa antibiotics.  Allergy Precautions: None required Coagulopathies: Reviewed. None identified.  Blood-thinner therapy: None at this time Active Infection(s): Reviewed. None identified. Karen Lewis is afebrile  Site Confirmation: Ms. Merkey was asked to confirm the procedure and laterality before marking the site Procedure checklist: Completed Consent: Before the procedure and under the influence of no sedative(s), amnesic(s), or anxiolytics, the patient was informed of the treatment options, risks and possible complications. To fulfill our ethical and legal obligations, as recommended by the American Medical Association's Code of Ethics, I have informed the patient of my clinical impression; the nature and purpose of the treatment or procedure; the risks, benefits, and possible complications of the intervention; the  alternatives, including doing nothing; the risk(s) and benefit(s) of the alternative treatment(s) or procedure(s); and the risk(s) and benefit(s) of doing nothing. The patient was provided information about the general risks and possible complications associated with the procedure. These may include, but are not limited to: failure to achieve desired goals, infection, bleeding, organ or nerve damage, allergic reactions, paralysis, and death. In addition, the patient was  informed of those risks and complications associated to Spine-related procedures, such as failure to decrease pain; infection (i.e.: Meningitis, epidural or intraspinal abscess); bleeding (i.e.: epidural hematoma, subarachnoid hemorrhage, or any other type of intraspinal or peri-dural bleeding); organ or nerve damage (i.e.: Any type of peripheral nerve, nerve root, or spinal cord injury) with subsequent damage to sensory, motor, and/or autonomic systems, resulting in permanent pain, numbness, and/or weakness of one or several areas of the body; allergic reactions; (i.e.: anaphylactic reaction); and/or death. Furthermore, the patient was informed of those risks and complications associated with the medications. These include, but are not limited to: allergic reactions (i.e.: anaphylactic or anaphylactoid reaction(s)); adrenal axis suppression; blood sugar elevation that in diabetics may result in ketoacidosis or comma; water retention that in patients with history of congestive heart failure may result in shortness of breath, pulmonary edema, and decompensation with resultant heart failure; weight gain; swelling or edema; medication-induced neural toxicity; particulate matter embolism and blood vessel occlusion with resultant organ, and/or nervous system infarction; and/or aseptic necrosis of one or more joints. Finally, the patient was informed that Medicine is not an exact science; therefore, there is also the possibility of unforeseen or  unpredictable risks and/or possible complications that may result in a catastrophic outcome. The patient indicated having understood very clearly. We have given the patient no guarantees and we have made no promises. Enough time was given to the patient to ask questions, all of which were answered to the patient's satisfaction. Ms. Lablanc has indicated that she wanted to continue with the procedure. Attestation: I, the ordering provider, attest that I have discussed with the patient the benefits, risks, side-effects, alternatives, likelihood of achieving goals, and potential problems during recovery for the procedure that I have provided informed consent. Date: 08/16/2017; Time: 6:50 AM  Pre-Procedure Preparation:  Monitoring: As per clinic protocol. Respiration, ETCO2, SpO2, BP, heart rate and rhythm monitor placed and checked for adequate function Safety Precautions: Patient was assessed for positional comfort and pressure points before starting the procedure. Time-out: I initiated and conducted the "Time-out" before starting the procedure, as per protocol. The patient was asked to participate by confirming the accuracy of the "Time Out" information. Verification of the correct person, site, and procedure were performed and confirmed by me, the nursing staff, and the patient. "Time-out" conducted as per Joint Commission's Universal Protocol (UP.01.01.01). "Time-out" Date & Time: 08/16/2017; 0940 hrs.  Description of Procedure Process:   Position: Prone Target Area: For Lumbar Facet blocks, the target is the groove formed by the junction of the transverse process and superior articular process. For the L5 dorsal ramus, the target is the notch between superior articular process and sacral ala. For the S1 dorsal ramus, the target is the superior and lateral edge of the posterior S1 Sacral foramen. Approach: Paramedial approach. Area Prepped: Entire Posterior Lumbosacral Region Prepping solution: ChloraPrep  (2% chlorhexidine gluconate and 70% isopropyl alcohol) Safety Precautions: Aspiration looking for blood return was conducted prior to all injections. At no point did we inject any substances, as a needle was being advanced. No attempts were made at seeking any paresthesias. Safe injection practices and needle disposal techniques used. Medications properly checked for expiration dates. SDV (single dose vial) medications used. Description of the Procedure: Protocol guidelines were followed. The patient was placed in position over the fluoroscopy table. The target area was identified and the area prepped in the usual manner. Skin desensitized using vapocoolant spray. Skin & deeper tissues infiltrated with local anesthetic.  Appropriate amount of time allowed to pass for local anesthetics to take effect. The procedure needle was introduced through the skin, ipsilateral to the reported pain, and advanced to the target area. Employing the "Medial Branch Technique", the needles were advanced to the angle made by the superior and medial portion of the transverse process, and the lateral and inferior portion of the superior articulating process of the targeted vertebral bodies. This area is known as "Burton's Eye" or the "Eye of the [place] Dog". A procedure needle was introduced through the skin, and this time advanced to the angle made by the superior and medial border of the sacral ala, and the lateral border of the S1 vertebral body. This last needle was later repositioned at the superior and lateral border of the posterior S1 foramen. Negative aspiration confirmed. Solution injected in intermittent fashion, asking for systemic symptoms every 0.5cc of injectate. The needles were then removed and the area cleansed, making sure to leave some of the prepping solution back to take advantage of its long term bactericidal properties.   Illustration of the posterior view of the lumbar spine and the posterior neural  structures. Laminae of L2 through S1 are labeled. DPRL5, dorsal primary ramus of L5; DPRS1, dorsal primary ramus of S1; DPR3, dorsal primary ramus of L3; FJ, facet (zygapophyseal) joint L3-L4; I, inferior articular process of L4; LB1, lateral branch of dorsal primary ramus of L1; IAB, inferior articular branches from L3 medial branch (supplies L4-L5 facet joint); IBP, intermediate branch plexus; MB3, medial branch of dorsal primary ramus of L3; NR3, third lumbar nerve root; S, superior articular process of L5; SAB, superior articular branches from L4 (supplies L4-5 facet joint also); TP3, transverse process of L3.  Vitals:   08/16/17 0956 08/16/17 1005 08/16/17 1015 08/16/17 1025  BP: 108/90 95/83 96/66  99/68  Pulse: 96     Resp: 15 17 18 16   Temp:      SpO2: 97% 96% 99% 99%  Weight:      Height:        Start Time: 0940 hrs. End Time: 0955 hrs. Materials:  Needle(s) Type: Regular needle Gauge: 22G Length: 3.5-in Medication(s): We administered lactated ringers, midazolam, fentaNYL, lidocaine, triamcinolone acetonide, ropivacaine (PF) 2 mg/mL (0.2%), triamcinolone acetonide, and ropivacaine (PF) 2 mg/mL (0.2%). Please see chart orders for dosing details.  Imaging Guidance (Spinal):  Type of Imaging Technique: Fluoroscopy Guidance (Spinal) Indication(s): Assistance in needle guidance and placement for procedures requiring needle placement in or near specific anatomical locations not easily accessible without such assistance. Exposure Time: Please see nurses notes. Contrast: None used. Fluoroscopic Guidance: I was personally present during the use of fluoroscopy. "Tunnel Vision Technique" used to obtain the best possible view of the target area. Parallax error corrected before commencing the procedure. "Direction-depth-direction" technique used to introduce the needle under continuous pulsed fluoroscopy. Once target was reached, antero-posterior, oblique, and lateral fluoroscopic projection used  confirm needle placement in all planes. Images permanently stored in EMR. Interpretation: No contrast injected. I personally interpreted the imaging intraoperatively. Adequate needle placement confirmed in multiple planes. Permanent images saved into the patient's record.  Antibiotic Prophylaxis:  Indication(s): None identified Antibiotic given: None  Post-operative Assessment:  EBL: None Complications: No immediate post-treatment complications observed by team, or reported by patient. Note: The patient tolerated the entire procedure well. A repeat set of vitals were taken after the procedure and the patient was kept under observation following institutional policy, for this type of procedure. Post-procedural neurological assessment was  performed, showing return to baseline, prior to discharge. The patient was provided with post-procedure discharge instructions, including a section on how to identify potential problems. Should any problems arise concerning this procedure, the patient was given instructions to immediately contact us, at any time, without hesitation. In any case, we plan to contact the patient by telephone for a follow-up status report regarding this interventional procedure. Comments:  No additional relevant information.  Plan of Care   Possible POC:  The patient will be coming back in 2 weeks for postprocedure evaluation by Crystal.  We will continue to manage her medications but we will keep the procedures to know sooner than 2-3 months.  She needs to continue working on lowering her BMI to 30.  She should seriously consider more aggressive therapy such as bariatric surgery.  Currently she is having a lot of depression secondary to her health situation.  She is not a candidate for any radiofrequency due to her body habitus.    Imaging Orders     DG C-Arm 1-60 Min-No Report  Procedure Orders     LUMBAR FACET(MEDIAL BRANCH NERVE BLOCK) MBNB  Medications ordered for  procedure: Meds ordered this encounter  Medications  . lactated ringers infusion 1,000 mL  . midazolam (VERSED) 5 MG/5ML injection 1-2 mg    Make sure Flumazenil is available in the pyxis when using this medication. If oversedation occurs, administer 0.2 mg IV over 15 sec. If after 45 sec no response, administer 0.2 mg again over 1 min; may repeat at 1 min intervals; not to exceed 4 doses (1 mg)  . fentaNYL (SUBLIMAZE) injection 25-50 mcg    Make sure Narcan is available in the pyxis when using this medication. In the event of respiratory depression (RR< 8/min): Titrate NARCAN (naloxone) in increments of 0.1 to 0.2 mg IV at 2-3 minute intervals, until desired degree of reversal.  . lidocaine (XYLOCAINE) 2 % (with pres) injection 200 mg  . triamcinolone acetonide (KENALOG-40) injection 40 mg  . ropivacaine (PF) 2 mg/mL (0.2%) (NAROPIN) injection 9 mL  . triamcinolone acetonide (KENALOG-40) injection 40 mg  . ropivacaine (PF) 2 mg/mL (0.2%) (NAROPIN) injection 9 mL   Medications administered: We administered lactated ringers, midazolam, fentaNYL, lidocaine, triamcinolone acetonide, ropivacaine (PF) 2 mg/mL (0.2%), triamcinolone acetonide, and ropivacaine (PF) 2 mg/mL (0.2%).  See the medical record for exact dosing, route, and time of administration.  This SmartLink is deprecated. Use AVSMEDLIST instead to display the medication list for a patient. Disposition: Discharge home  Discharge Date & Time: 08/16/2017; 1025 hrs.   Physician-requested Follow-up: Return for post-procedure eval (2 wks)(w/ Dionisio David, NP). Future Appointments  Date Time Provider [place]  08/30/2017 11:00 AM Vevelyn Francois, NP [facility] None   Primary Care Physician: Idelle Crouch, MD Location: [facility] Note by: Gaspar Cola, MD Date: 08/16/2017; Time: 10:46 AM  Disclaimer:  Medicine is not an exact science. The only guarantee in medicine is that nothing is  guaranteed. It is important to note that the decision to proceed with this intervention was based on the information collected from the patient. The Data and conclusions were drawn from the patient's questionnaire, the interview, and the physical examination. Because the information was provided in large part by the patient, it cannot be guaranteed that it has not been purposely or unconsciously manipulated. Every effort has been made to obtain as much relevant data as possible for this evaluation. It is important to note that the  conclusions that lead to this procedure are derived in large part from the available data. Always take into account that the treatment will also be dependent on availability of resources and existing treatment guidelines, considered by other Pain Management Practitioners as being common knowledge and practice, at the time of the intervention. For Medico-Legal purposes, it is also important to point out that variation in procedural techniques and pharmacological choices are the acceptable norm. The indications, contraindications, technique, and results of the above procedure should only be interpreted and judged by a Board-Certified Interventional Pain Specialist with extensive familiarity and expertise in the same exact procedure and technique.

## 2017-08-16 NOTE — Patient Instructions (Signed)

## 2017-08-17 ENCOUNTER — Telehealth: Payer: Self-pay | Admitting: *Deleted

## 2017-08-17 NOTE — Telephone Encounter (Signed)
Attempted to call for post procedure follow-up. Message left. 

## 2017-08-30 ENCOUNTER — Encounter: Payer: Self-pay | Admitting: Nurse Practitioner

## 2017-08-30 ENCOUNTER — Other Ambulatory Visit: Payer: Self-pay

## 2017-08-30 ENCOUNTER — Ambulatory Visit: Payer: Medicare HMO | Attending: Nurse Practitioner | Admitting: Nurse Practitioner

## 2017-08-30 VITALS — BP 138/82 | HR 78 | Temp 99.6°F | Resp 16 | Ht 64.0 in | Wt 378.0 lb

## 2017-08-30 DIAGNOSIS — I10 Essential (primary) hypertension: Secondary | ICD-10-CM | POA: Insufficient documentation

## 2017-08-30 DIAGNOSIS — M25562 Pain in left knee: Secondary | ICD-10-CM | POA: Insufficient documentation

## 2017-08-30 DIAGNOSIS — M792 Neuralgia and neuritis, unspecified: Secondary | ICD-10-CM

## 2017-08-30 DIAGNOSIS — M25561 Pain in right knee: Secondary | ICD-10-CM | POA: Diagnosis not present

## 2017-08-30 DIAGNOSIS — Z5181 Encounter for therapeutic drug level monitoring: Secondary | ICD-10-CM | POA: Diagnosis not present

## 2017-08-30 DIAGNOSIS — M25551 Pain in right hip: Secondary | ICD-10-CM

## 2017-08-30 DIAGNOSIS — G4700 Insomnia, unspecified: Secondary | ICD-10-CM | POA: Insufficient documentation

## 2017-08-30 DIAGNOSIS — E890 Postprocedural hypothyroidism: Secondary | ICD-10-CM | POA: Diagnosis not present

## 2017-08-30 DIAGNOSIS — Z881 Allergy status to other antibiotic agents status: Secondary | ICD-10-CM | POA: Diagnosis not present

## 2017-08-30 DIAGNOSIS — M545 Low back pain, unspecified: Secondary | ICD-10-CM

## 2017-08-30 DIAGNOSIS — G8929 Other chronic pain: Secondary | ICD-10-CM | POA: Diagnosis not present

## 2017-08-30 DIAGNOSIS — Z7984 Long term (current) use of oral hypoglycemic drugs: Secondary | ICD-10-CM | POA: Insufficient documentation

## 2017-08-30 DIAGNOSIS — R05 Cough: Secondary | ICD-10-CM | POA: Diagnosis not present

## 2017-08-30 DIAGNOSIS — E1159 Type 2 diabetes mellitus with other circulatory complications: Secondary | ICD-10-CM | POA: Diagnosis not present

## 2017-08-30 DIAGNOSIS — Z79899 Other long term (current) drug therapy: Secondary | ICD-10-CM | POA: Diagnosis not present

## 2017-08-30 DIAGNOSIS — E559 Vitamin D deficiency, unspecified: Secondary | ICD-10-CM | POA: Diagnosis not present

## 2017-08-30 DIAGNOSIS — R0609 Other forms of dyspnea: Secondary | ICD-10-CM | POA: Diagnosis not present

## 2017-08-30 DIAGNOSIS — R509 Fever, unspecified: Secondary | ICD-10-CM | POA: Diagnosis not present

## 2017-08-30 DIAGNOSIS — J4530 Mild persistent asthma, uncomplicated: Secondary | ICD-10-CM | POA: Diagnosis not present

## 2017-08-30 DIAGNOSIS — M797 Fibromyalgia: Secondary | ICD-10-CM

## 2017-08-30 DIAGNOSIS — G894 Chronic pain syndrome: Secondary | ICD-10-CM

## 2017-08-30 DIAGNOSIS — E119 Type 2 diabetes mellitus without complications: Secondary | ICD-10-CM | POA: Diagnosis not present

## 2017-08-30 DIAGNOSIS — J45909 Unspecified asthma, uncomplicated: Secondary | ICD-10-CM | POA: Insufficient documentation

## 2017-08-30 DIAGNOSIS — E785 Hyperlipidemia, unspecified: Secondary | ICD-10-CM | POA: Diagnosis not present

## 2017-08-30 DIAGNOSIS — Z79891 Long term (current) use of opiate analgesic: Secondary | ICD-10-CM | POA: Insufficient documentation

## 2017-08-30 DIAGNOSIS — Z96642 Presence of left artificial hip joint: Secondary | ICD-10-CM | POA: Insufficient documentation

## 2017-08-30 DIAGNOSIS — Z6841 Body Mass Index (BMI) 40.0 and over, adult: Secondary | ICD-10-CM | POA: Diagnosis not present

## 2017-08-30 DIAGNOSIS — Z886 Allergy status to analgesic agent status: Secondary | ICD-10-CM | POA: Insufficient documentation

## 2017-08-30 DIAGNOSIS — K219 Gastro-esophageal reflux disease without esophagitis: Secondary | ICD-10-CM | POA: Diagnosis not present

## 2017-08-30 DIAGNOSIS — E1169 Type 2 diabetes mellitus with other specified complication: Secondary | ICD-10-CM | POA: Diagnosis not present

## 2017-08-30 DIAGNOSIS — E063 Autoimmune thyroiditis: Secondary | ICD-10-CM | POA: Diagnosis not present

## 2017-08-30 MED ORDER — MAGNESIUM OXIDE 400 MG PO TABS: 400.0000 mg | ORAL_TABLET | Freq: Two times a day (BID) | ORAL | 2 refills | Status: DC

## 2017-08-30 MED ORDER — OXYCODONE HCL 5 MG PO TABS: 5.0000 mg | ORAL_TABLET | Freq: Every day | ORAL | 0 refills | Status: DC | PRN

## 2017-08-30 MED ORDER — GABAPENTIN 800 MG PO TABS: 800.0000 mg | ORAL_TABLET | Freq: Four times a day (QID) | ORAL | 0 refills | Status: DC

## 2017-08-30 NOTE — Progress Notes (Signed)
Nursing Pain Medication Assessment:  Safety precautions to be maintained throughout the outpatient stay will include: orient to surroundings, keep bed in low position, maintain call bell within reach at all times, provide assistance with transfer out of bed and ambulation.  Medication Inspection Compliance: Pill count conducted under aseptic conditions, in front of the patient. Neither the pills nor the bottle was removed from the patient's sight at any time. Once count was completed pills were immediately returned to the patient in their original bottle.  Medication: Oxycodone IR Pill/Patch Count: 69 of 150 pills remain Pill/Patch Appearance: Markings consistent with prescribed medication Bottle Appearance: Standard pharmacy container. Clearly labeled. Filled Date: 11/07 / 2018 Last Medication intake:  Yesterday

## 2017-08-30 NOTE — Progress Notes (Signed)
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 today       Lumbar Lateral bending test: evaluation deferred today       Patrick's Maneuver: evaluation deferred today                    Gait & Posture Assessment  Ambulation: Unassisted Gait: Relatively normal for age and body habitus Posture: WNL   Lower Extremity Exam    Side: Right lower extremity  Side: Left lower extremity  Skin & Extremity Inspection: Skin color, temperature, and hair growth are WNL. No peripheral edema or cyanosis. No masses, redness, swelling, asymmetry, or associated skin lesions. No contractures.  Skin & Extremity Inspection: Skin color, temperature, and hair growth are WNL. No peripheral edema or cyanosis. No masses, redness, swelling, asymmetry, or associated skin lesions. No contractures.  Functional ROM: Unrestricted ROM          Functional ROM: Unrestricted ROM          Muscle Tone/Strength: Functionally intact. No obvious neuro-muscular anomalies detected.  Muscle Tone/Strength: Functionally intact. No obvious neuro-muscular anomalies detected.  Sensory  (Neurological): Unimpaired  Sensory (Neurological): Unimpaired  Palpation: No palpable anomalies  Palpation: No palpable anomalies   Assessment  Primary Diagnosis & Pertinent Problem List: The primary encounter diagnosis was Chronic low back pain [facility] source of pain) (Bilateral) (R>L). Diagnoses of Chronic hip pain (Secondary source of pain) (Right), Chronic knee pain (Primary Source of Pain) (Bilateral) (R>L), Fibromyalgia, Hypomagnesemia, Neurogenic pain, and Chronic pain syndrome were also pertinent to this visit.  Status Diagnosis  Controlled Controlled Controlled 1. Chronic low back pain [facility] source of pain) (Bilateral) (R>L)   2. Chronic hip pain (Secondary source of pain) (Right)   3. Chronic knee pain (Primary Source of Pain) (Bilateral) (R>L)   4. Fibromyalgia   5. Hypomagnesemia   6. Neurogenic pain   7. Chronic pain syndrome     Problems updated and reviewed during this visit: No problems updated. Plan of Care  Pharmacotherapy (Medications Ordered): Meds ordered this encounter  Medications  . oxyCODONE (OXY IR/ROXICODONE) 5 MG immediate release tablet    Sig: Take 1 tablet (5 mg total) by mouth 5 (five) times daily as needed for severe pain.    Dispense:  150 tablet    Refill:  0    Fill one day early if pharmacy is closed on scheduled refill date. Do not fill until:11/11/2017 To last until: 12/11/2017    Order Specific Question:   Supervising Provider    Answer:   Milinda Pointer [phone]  . oxyCODONE (OXY IR/ROXICODONE) 5 MG immediate release tablet    Sig: Take 1 tablet (5 mg total) by mouth 5 (five) times daily as needed for severe pain.    Dispense:  150 tablet    Refill:  0    Fill one day early if pharmacy is closed on scheduled refill date. Do not fill until: 10/12/2017 To last until:11/11/2017    Order Specific Question:   Supervising Provider  AnswerMilinda Pointer [[id number]]  . oxyCODONE (OXY IR/ROXICODONE) 5 MG immediate release tablet     Sig: Take 1 tablet (5 mg total) by mouth 5 (five) times daily as needed for severe pain.    Dispense:  150 tablet    Refill:  0    Fill one day early if pharmacy is closed on scheduled refill date. Do not fill until: 09/12/2017 To last until: 10/12/2017    Order Specific Question:   Supervising Provider    Answer:   Milinda Pointer [phone]  . gabapentin (NEURONTIN) 800 MG tablet    Sig: Take 1 tablet (800 mg total) by mouth every 6 (six) hours.    Dispense:  360 tablet    Refill:  0    Do not place this medication, or any other prescription from our practice, on "Automatic Refill". Patient may have prescription filled one day early if pharmacy is closed on scheduled refill date.    Order Specific Question:   Supervising Provider    Answer:   Milinda Pointer [phone]  . magnesium oxide (MAG-OX) 400 MG tablet    Sig: Take 1 tablet (400 mg total) by mouth 2 (two) times daily.    Dispense:  60 tablet    Refill:  2    Do not place this medication, or any other prescription from our practice, on "Automatic Refill". Patient may have prescription filled one day early if pharmacy is closed on scheduled refill date.    Order Specific Question:   Supervising Provider    Answer:   Milinda Pointer [[id number]]  This SmartLink is deprecated. Use AVSMEDLIST instead to display the medication list for a patient. Medications administered today: Marcelyn A. Holleran had no medications administered during this visit. Lab-work, procedure(s), and/or referral(s): No orders of the defined types were placed in this encounter.  Imaging and/or referral(s): None  Interventional therapies: Planned, scheduled, and/or pending:  Not at this time.    Considering:  Series of 5 Hyalgan knee injections    Palliative PRN treatment(s):  Intra-articular Hyalgan knee injections.  Palliative bilateral genicular nerve block  Palliative bilateral lumbar facet block        Provider-requested follow-up: Return  in about 3 months (around 11/28/2017) for MedMgmt with Me Donella Stade [facility] Pace).  Future Appointments  Date Time Provider [place]  11/23/2017 11:15 AM Vevelyn Francois, NP [facility] None   Primary Care Physician: Idelle Crouch, MD Location: [facility] Note by: Vevelyn Francois NP Date: 08/30/2017; Time: 1:08 PM  Pain Score Disclaimer: We use the NRS-11 scale. This is a self-reported, subjective measurement of pain severity with only modest accuracy. It is used primarily to identify changes within a particular patient. It must be understood that outpatient pain scales are significantly less accurate that those used for research, where they can be applied under ideal controlled circumstances with minimal exposure to variables. In reality, the score is likely to be a combination of pain intensity and pain affect, where pain affect describes the degree of emotional arousal or changes in action readiness caused by the sensory experience of pain. Factors such as social and work situation, setting, emotional state, anxiety levels, expectation, and prior pain experience may influence pain perception and show large inter-individual differences that may also be affected by time variables.  Patient instructions provided during this appointment: Patient Instructions   ____________________________________________________________________________________________  Medication Rules  Applies to: All patients receiving prescriptions (written or electronic).  Pharmacy of record:  Pharmacy where electronic prescriptions will be sent. If written prescriptions are taken to a different pharmacy, please inform the nursing staff. The pharmacy listed in the electronic medical record should be the one where you would like electronic prescriptions to be sent.  Prescription refills: Only during scheduled appointments. Applies to both, written and electronic prescriptions.  NOTE: The  following applies primarily to controlled substances (Opioid* Pain Medications).   Patient's responsibilities: 1. Pain Pills: Bring all pain pills to every appointment (except for procedure appointments). 2. Pill Bottles: Bring pills in original pharmacy bottle. Always bring newest bottle. Bring bottle, even if empty. 3. Medication refills: You are responsible for knowing and keeping track of what medications you need refilled. The day before your appointment, write a list of all prescriptions that need to be refilled. Bring that list to your appointment and give it to the admitting nurse. Prescriptions will be written only during appointments. If you forget a medication, it will not be "Called in", "Faxed", or "electronically sent". You will need to get another appointment to get these prescribed. 4. Prescription Accuracy: You are responsible for carefully inspecting your prescriptions before leaving our office. Have the discharge nurse carefully go over each prescription with you, before taking them home. Make sure that your name is accurately spelled, that your address is correct. Check the name and dose of your medication to make sure it is accurate. Check the number of pills, and the written instructions to make sure they are clear and accurate. Make sure that you are given enough medication to last until your next medication refill appointment. 5. Taking Medication: Take medication as prescribed. Never take more pills than instructed. Never take medication more frequently than prescribed. Taking less pills or less frequently is permitted and encouraged, when it comes to controlled substances (written prescriptions).  6. Inform other Doctors: Always inform, all of your healthcare providers, of all the medications you take. 7. Pain Medication from other Providers: You are not allowed to accept any additional pain medication from any other Doctor or Healthcare provider. There are two exceptions to this  rule. (see below) In the event that you require additional pain medication, you are responsible for notifying us, as stated below. 8. Medication Agreement: You are responsible for carefully reading and following our Medication Agreement. This must be signed before receiving any prescriptions from our practice. Safely store a copy of your signed Agreement. Violations to the Agreement will result in no further prescriptions. (Additional copies of our Medication Agreement are available upon request.) 9. Laws, Rules, & Regulations: All patients are expected to follow all Federal and [employer], Rules, [employer]. Ignorance of the Laws does not constitute a valid excuse. The use of any illegal substances is prohibited. 10. Adopted CDC guidelines & recommendations: Target dosing levels will be at or below 60 MME/day. Use of benzodiazepines** is not recommended.  Exceptions: There are only two exceptions to the rule of not receiving pain medications from other Healthcare Providers. 1. Exception #1 (Emergencies): In the event of an emergency (i.e.: accident requiring emergency care), you are allowed to receive additional pain medication. However, you are responsible for: As soon as you are able, call our office [phone], at any time of the day or night, and leave a message stating your name, the date and nature of the emergency, and the name and dose of the medication prescribed. In the event that your call is answered by a member of our staff, make sure to  document and save the date, time, and the name of the person that took your information.  2. Exception #2 (Planned Surgery): In the event that you are scheduled by another doctor or dentist to have any type of surgery or procedure, you are allowed (for a period no longer than 30 days), to receive additional pain medication, for the acute post-op pain. However, in this case, you are responsible for picking up a copy of our "Post-op Pain  Management for Surgeons" handout, and giving it to your surgeon or dentist. This document is available at our office, and does not require an appointment to obtain it. Simply go to our office during business hours (Monday-Thursday from 8:00 AM to 4:00 PM) (Friday 8:00 AM to 12:00 Noon) or if you have a scheduled appointment with [place], prior to your surgery, and ask for it by name. In addition, you will need to provide [place] with your name, name of your surgeon, type of surgery, and date of procedure or surgery.  *Opioid medications include: morphine, codeine, oxycodone, oxymorphone, hydrocodone, hydromorphone, meperidine, tramadol, tapentadol, buprenorphine, fentanyl, methadone. **Benzodiazepine medications include: diazepam (Valium), alprazolam (Xanax), clonazepam (Klonopine), lorazepam (Ativan), clorazepate (Tranxene), chlordiazepoxide (Librium), estazolam (Prosom), oxazepam (Serax), temazepam (Restoril), triazolam (Halcion)  You were given 3 prescriptions for Oxycodone today.  ____________________________________________________________________________________________

## 2017-08-30 NOTE — Patient Instructions (Addendum)
____________________________________________________________________________________________  Medication Rules  Applies to: All patients receiving prescriptions (written or electronic).  Pharmacy of record: Pharmacy where electronic prescriptions will be sent. If written prescriptions are taken to a different pharmacy, please inform the nursing staff. The pharmacy listed in the electronic medical record should be the one where you would like electronic prescriptions to be sent.  Prescription refills: Only during scheduled appointments. Applies to both, written and electronic prescriptions.  NOTE: The following applies primarily to controlled substances (Opioid* Pain Medications).   Patient's responsibilities: 1. Pain Pills: Bring all pain pills to every appointment (except for procedure appointments). 2. Pill Bottles: Bring pills in original pharmacy bottle. Always bring newest bottle. Bring bottle, even if empty. 3. Medication refills: You are responsible for knowing and keeping track of what medications you need refilled. The day before your appointment, write a list of all prescriptions that need to be refilled. Bring that list to your appointment and give it to the admitting nurse. Prescriptions will be written only during appointments. If you forget a medication, it will not be "Called in", "Faxed", or "electronically sent". You will need to get another appointment to get these prescribed. 4. Prescription Accuracy: You are responsible for carefully inspecting your prescriptions before leaving our office. Have the discharge nurse carefully go over each prescription with you, before taking them home. Make sure that your name is accurately spelled, that your address is correct. Check the name and dose of your medication to make sure it is accurate. Check the number of pills, and the written instructions to make sure they are clear and accurate. Make sure that you are given enough medication to  last until your next medication refill appointment. 5. Taking Medication: Take medication as prescribed. Never take more pills than instructed. Never take medication more frequently than prescribed. Taking less pills or less frequently is permitted and encouraged, when it comes to controlled substances (written prescriptions).  6. Inform other Doctors: Always inform, all of your healthcare providers, of all the medications you take. 7. Pain Medication from other Providers: You are not allowed to accept any additional pain medication from any other Doctor or Healthcare provider. There are two exceptions to this rule. (see below) In the event that you require additional pain medication, you are responsible for notifying us, as stated below. 8. Medication Agreement: You are responsible for carefully reading and following our Medication Agreement. This must be signed before receiving any prescriptions from our practice. Safely store a copy of your signed Agreement. Violations to the Agreement will result in no further prescriptions. (Additional copies of our Medication Agreement are available upon request.) 9. Laws, Rules, & Regulations: All patients are expected to follow all Federal and State Laws, Statutes, Rules, & Regulations. Ignorance of the Laws does not constitute a valid excuse. The use of any illegal substances is prohibited. 10. Adopted CDC guidelines & recommendations: Target dosing levels will be at or below 60 MME/day. Use of benzodiazepines** is not recommended.  Exceptions: There are only two exceptions to the rule of not receiving pain medications from other Healthcare Providers. 1. Exception #1 (Emergencies): In the event of an emergency (i.e.: accident requiring emergency care), you are allowed to receive additional pain medication. However, you are responsible for: As soon as you are able, call our office [phone], at any time of the day or night, and leave a message stating your  name, the date and nature of the emergency, and the name and dose of the medication   prescribed. In the event that your call is answered by a member of our staff, make sure to document and save the date, time, and the name of the person that took your information.  2. Exception #2 (Planned Surgery): In the event that you are scheduled by another doctor or dentist to have any type of surgery or procedure, you are allowed (for a period no longer than 30 days), to receive additional pain medication, for the acute post-op pain. However, in this case, you are responsible for picking up a copy of our "Post-op Pain Management for Surgeons" handout, and giving it to your surgeon or dentist. This document is available at our office, and does not require an appointment to obtain it. Simply go to our office during business hours (Monday-Thursday from 8:00 AM to 4:00 PM) (Friday 8:00 AM to 12:00 Noon) or if you have a scheduled appointment with us, prior to your surgery, and ask for it by name. In addition, you will need to provide us with your name, name of your surgeon, type of surgery, and date of procedure or surgery.  *Opioid medications include: morphine, codeine, oxycodone, oxymorphone, hydrocodone, hydromorphone, meperidine, tramadol, tapentadol, buprenorphine, fentanyl, methadone. **Benzodiazepine medications include: diazepam (Valium), alprazolam (Xanax), clonazepam (Klonopine), lorazepam (Ativan), clorazepate (Tranxene), chlordiazepoxide (Librium), estazolam (Prosom), oxazepam (Serax), temazepam (Restoril), triazolam (Halcion)  You were given 3 prescriptions for Oxycodone today.  ____________________________________________________________________________________________   

## 2017-09-14 ENCOUNTER — Ambulatory Visit (INDEPENDENT_AMBULATORY_CARE_PROVIDER_SITE_OTHER): Payer: Medicare HMO

## 2017-09-14 ENCOUNTER — Ambulatory Visit: Payer: Self-pay

## 2017-09-14 VITALS — BP 161/81 | HR 83 | Ht 64.0 in | Wt 374.0 lb

## 2017-09-14 DIAGNOSIS — R300 Dysuria: Secondary | ICD-10-CM

## 2017-09-14 LAB — MICROSCOPIC EXAMINATION: WBC, UA: >30 /hpf — ABNORMAL HIGH (ref 0–?)

## 2017-09-14 LAB — URINALYSIS, COMPLETE

## 2017-09-14 NOTE — Progress Notes (Signed)
Pt presents today with c/o urinary frequency, dysuria, back pain, lower abd pain, nausea, and chills. A cath specimen was obtained for u/a and cx.   Blood pressure (!) 161/81, pulse 83, height 5\' 4"  (1.626 m), weight (!) 374 lb (169.6 kg).

## 2017-09-16 ENCOUNTER — Telehealth: Payer: Self-pay

## 2017-09-16 LAB — CULTURE, URINE COMPREHENSIVE

## 2017-09-16 MED ORDER — CEPHALEXIN 500 MG PO CAPS: 500.0000 mg | ORAL_CAPSULE | Freq: Two times a day (BID) | ORAL | 0 refills | Status: DC

## 2017-09-16 NOTE — Telephone Encounter (Signed)
Pt made aware and abx sent to pharmacy.

## 2017-09-16 NOTE — Telephone Encounter (Signed)
-----   Message from Nori Riis, PA-C sent at 09/16/2017  7:56 AM EST ----- Karen Lewis's preliminary urine culture is positive for an infection.  We need to start an antibiotic at this time as final results will not be available until Monday.  Let's start Keflex 500 mg, bid x 7.

## 2017-09-19 ENCOUNTER — Telehealth: Payer: Self-pay

## 2017-09-19 MED ORDER — LEVOFLOXACIN 500 MG PO TABS: 500.0000 mg | ORAL_TABLET | Freq: Every day | ORAL | 0 refills | Status: DC

## 2017-09-19 NOTE — Telephone Encounter (Signed)
Spoke with pt in reference to levaquin. Pt states that she can take that. Abx sent to pharmacy. Pt voiced understanding.

## 2017-09-19 NOTE — Telephone Encounter (Signed)
Unfortunately due to her allergies, we are limited.  We can try Levaquin 500 mg qd x7, but it is related to Cipro which she has issues with.  If she cannot take Levaquin, she will need a referral to ID.

## 2017-09-19 NOTE — Telephone Encounter (Signed)
-----   Message from Nori Riis, PA-C sent at 09/16/2017  5:02 PM EST ----- Urine culture is positive.  It should be sensitive to the Keflex.

## 2017-09-19 NOTE — Telephone Encounter (Signed)
Spoke with pt in reference to being on appropriate abx. Pt stated that the abx is not working and demanded something else. Pt stated that her urine still is cloudy, smells bad, and continues to have dysuria. Reinforced with pt to increase fluid in take, complete abx course, and then re-evaluate s/s. Pt once again demanded something else. Please advise.

## 2017-09-28 DIAGNOSIS — I10 Essential (primary) hypertension: Secondary | ICD-10-CM | POA: Diagnosis not present

## 2017-09-28 DIAGNOSIS — R0789 Other chest pain: Secondary | ICD-10-CM | POA: Diagnosis not present

## 2017-09-28 DIAGNOSIS — G8911 Acute pain due to trauma: Secondary | ICD-10-CM | POA: Diagnosis not present

## 2017-09-28 DIAGNOSIS — R079 Chest pain, unspecified: Secondary | ICD-10-CM | POA: Diagnosis not present

## 2017-09-28 DIAGNOSIS — E1159 Type 2 diabetes mellitus with other circulatory complications: Secondary | ICD-10-CM | POA: Diagnosis not present

## 2017-10-01 DIAGNOSIS — R21 Rash and other nonspecific skin eruption: Secondary | ICD-10-CM | POA: Diagnosis not present

## 2017-10-04 DIAGNOSIS — L509 Urticaria, unspecified: Secondary | ICD-10-CM | POA: Diagnosis not present

## 2017-10-19 DIAGNOSIS — E282 Polycystic ovarian syndrome: Secondary | ICD-10-CM | POA: Diagnosis not present

## 2017-10-19 DIAGNOSIS — Z79899 Other long term (current) drug therapy: Secondary | ICD-10-CM | POA: Diagnosis not present

## 2017-10-19 DIAGNOSIS — E079 Disorder of thyroid, unspecified: Secondary | ICD-10-CM | POA: Diagnosis not present

## 2017-10-19 DIAGNOSIS — E1169 Type 2 diabetes mellitus with other specified complication: Secondary | ICD-10-CM | POA: Diagnosis not present

## 2017-10-19 DIAGNOSIS — I10 Essential (primary) hypertension: Secondary | ICD-10-CM | POA: Diagnosis not present

## 2017-10-19 DIAGNOSIS — G4733 Obstructive sleep apnea (adult) (pediatric): Secondary | ICD-10-CM | POA: Diagnosis not present

## 2017-10-19 DIAGNOSIS — E1159 Type 2 diabetes mellitus with other circulatory complications: Secondary | ICD-10-CM | POA: Diagnosis not present

## 2017-10-19 DIAGNOSIS — T886XXA Anaphylactic reaction due to adverse effect of correct drug or medicament properly administered, initial encounter: Secondary | ICD-10-CM | POA: Diagnosis not present

## 2017-11-23 ENCOUNTER — Encounter: Payer: Medicare HMO | Admitting: Nurse Practitioner

## 2017-11-26 DIAGNOSIS — R300 Dysuria: Secondary | ICD-10-CM | POA: Diagnosis not present

## 2017-11-26 DIAGNOSIS — N390 Urinary tract infection, site not specified: Secondary | ICD-10-CM | POA: Diagnosis not present

## 2017-12-05 DIAGNOSIS — T8140XA Infection following a procedure, unspecified, initial encounter: Secondary | ICD-10-CM | POA: Insufficient documentation

## 2017-12-05 NOTE — Progress Notes (Signed)
Patient's Name: Karen Lewis  MRN: [id number]  Referring Provider: Idelle Crouch, MD  DOB: [birth year]  PCP: Idelle Crouch, MD  DOS: 12/06/2017  Note by: Gaspar Cola, MD  Service setting: Ambulatory outpatient  Specialty: Interventional Pain Management  Patient type: Established  Location: [facility] (AMB) Pain Management Facility  Visit type: Interventional Procedure   Primary Reason for Visit: Interventional Pain Management Treatment. CC: Knee Pain (bilateral, right is worse) and Back Pain (mid to lower)  Procedure:  Anesthesia, Analgesia, Anxiolysis:  Type: Therapeutic Intra-Articular Hyalgan Knee Injection #1  Region: Lateral infrapatellar Knee Region Level: Knee Joint Laterality: Bilateral  Type: Moderate (Conscious) Sedation combined with Local Anesthesia Indication(s): Analgesia and Anxiety Local Anesthetic: Lidocaine 1-2% Route: Intravenous (IV) IV Access: Secured Sedation: Meaningful verbal contact was maintained at all times during the procedure    Indications: 1. Osteoarthritis of knee (Bilateral) (R>L)   2. Chronic knee pain (Primary Source of Pain) (Bilateral) (R>L)    Pain Score: Pre-procedure: 6 /10 Post-procedure: 4 ( when sitting, 4 when standing up to be discharge)/10  Pre-op Assessment:  Karen Lewis is a 47 y.o. (year old), female patient, seen today for interventional treatment. She  has a past surgical history that includes Laparoscopic partial gastrectomy; Shoulder arthroscopy (Right); Joint replacement (Left, hip); Carpal tunnel release (Bilateral); Diagnostic laparoscopy; Cholecystectomy; Trigger finger release (Right); Thyroidectomy (N/A, 11/12/2015); left trigger finger; Roux-en-Y Gastric Bypass (06/03/2017); and Hiatal hernia repair. Ms. Husk has a current medication list which includes the following prescription(s): acetaminophen, acyclovir, albuterol, accu-chek aviva, accu-chek aviva plus, butalbital-acetaminophen-caffeine, cephalexin, gabapentin,  guaifenesin-codeine, ipratropium-albuterol, ketoconazole, simple diagnostics lancing dev, levocetirizine, levothyroxine, levothyroxine, magnesium oxide, meclizine, medroxyprogesterone, metformin, metoprolol tartrate, montelukast, multivitamin with minerals, naloxone, omeprazole, oxycodone, quetiapine, ranitidine, rosuvastatin, sertraline, spironolactone, tizanidine, triamcinolone, triamcinolone, true metrix blood glucose test, ulticare alcohol swabs, vitamin d (ergocalciferol), zonisamide, oxycodone, oxycodone, phentermine, and ranitidine, and the following Facility-Administered Medications: fentanyl, lactated ringers, lidocaine, midazolam, sodium hyaluronate, and sodium hyaluronate. Her primarily concern today is the Knee Pain (bilateral, right is worse) and Back Pain (mid to lower)  Initial Vital Signs:  Pulse Rate: 89 Temp: 99.2 F (37.3 C) Resp: 20 BP: 109/68 SpO2: 97 %  BMI: Estimated body mass index is 66.09 kg/m as calculated from the following:   Height as of this encounter: 5\' 4"  (1.626 m).   Weight as of this encounter: 385 lb (174.6 kg).  Risk Assessment: Allergies: Reviewed. She is allergic to bactrim [sulfamethoxazole-trimethoprim]; xolair [omalizumab]; ciprofloxacin; shellfish allergy; aspirin; clindamycin; motrin [ibuprofen]; nsaids; other; and sulfa antibiotics.  Allergy Precautions: None required Coagulopathies: Reviewed. None identified.  Blood-thinner therapy: None at this time Active Infection(s): Reviewed. None identified. Ms. Mcferran is afebrile  Site Confirmation: Ms. Haislip was asked to confirm the procedure and laterality before marking the site Procedure checklist: Completed Consent: Before the procedure and under the influence of no sedative(s), amnesic(s), or anxiolytics, the patient was informed of the treatment options, risks and possible complications. To fulfill our ethical and legal obligations, as recommended by the American Medical Association's Code of Ethics, I  have informed the patient of my clinical impression; the nature and purpose of the treatment or procedure; the risks, benefits, and possible complications of the intervention; the alternatives, including doing nothing; the risk(s) and benefit(s) of the alternative treatment(s) or procedure(s); and the risk(s) and benefit(s) of doing nothing. The patient was provided information about the general risks and possible complications associated with the procedure. These may include, but are not limited to: failure to  achieve desired goals, infection, bleeding, organ or nerve damage, allergic reactions, paralysis, and death. In addition, the patient was informed of those risks and complications associated to the procedure, such as failure to decrease pain; infection; bleeding; organ or nerve damage with subsequent damage to sensory, motor, and/or autonomic systems, resulting in permanent pain, numbness, and/or weakness of one or several areas of the body; allergic reactions; (i.e.: anaphylactic reaction); and/or death. Furthermore, the patient was informed of those risks and complications associated with the medications. These include, but are not limited to: allergic reactions (i.e.: anaphylactic or anaphylactoid reaction(s)); adrenal axis suppression; blood sugar elevation that in diabetics may result in ketoacidosis or comma; water retention that in patients with history of congestive heart failure may result in shortness of breath, pulmonary edema, and decompensation with resultant heart failure; weight gain; swelling or edema; medication-induced neural toxicity; particulate matter embolism and blood vessel occlusion with resultant organ, and/or nervous system infarction; and/or aseptic necrosis of one or more joints. Finally, the patient was informed that Medicine is not an exact science; therefore, there is also the possibility of unforeseen or unpredictable risks and/or possible complications that may result in  a catastrophic outcome. The patient indicated having understood very clearly. We have given the patient no guarantees and we have made no promises. Enough time was given to the patient to ask questions, all of which were answered to the patient's satisfaction. Ms. Camposano has indicated that she wanted to continue with the procedure. Attestation: I, the ordering provider, attest that I have discussed with the patient the benefits, risks, side-effects, alternatives, likelihood of achieving goals, and potential problems during recovery for the procedure that I have provided informed consent. Date  Time: 12/06/2017 12:44 PM  Pre-Procedure Preparation:  Monitoring: As per clinic protocol. Respiration, ETCO2, SpO2, BP, heart rate and rhythm monitor placed and checked for adequate function Safety Precautions: Patient was assessed for positional comfort and pressure points before starting the procedure. Time-out: I initiated and conducted the "Time-out" before starting the procedure, as per protocol. The patient was asked to participate by confirming the accuracy of the "Time Out" information. Verification of the correct person, site, and procedure were performed and confirmed by me, the nursing staff, and the patient. "Time-out" conducted as per Joint Commission's Universal Protocol (UP.01.01.01). Time: 1320  Description of Procedure:       Position: Modified Fowler's position Target Area: Knee Joint Approach: Just above the Lateral tibial plateau, lateral to the infrapatellar tendon. Area Prepped: Entire knee area, from the mid-thigh to the mid-shin. Prepping solution: ChloraPrep (2% chlorhexidine gluconate and 70% isopropyl alcohol) Safety Precautions: Aspiration looking for blood return was conducted prior to all injections. At no point did we inject any substances, as a needle was being advanced. No attempts were made at seeking any paresthesias. Safe injection practices and needle disposal techniques  used. Medications properly checked for expiration dates. SDV (single dose vial) medications used. Description of the Procedure: Protocol guidelines were followed. The patient was placed in position over the fluoroscopy table. The target area was identified and the area prepped in the usual manner. Skin desensitized using vapocoolant spray. Skin & deeper tissues infiltrated with local anesthetic. Appropriate amount of time allowed to pass for local anesthetics to take effect. The procedure needles were then advanced to the target area. Proper needle placement secured. Negative aspiration confirmed. Solution injected in intermittent fashion, asking for systemic symptoms every 0.5cc of injectate. The needles were then removed and the area cleansed, making  sure to leave some of the prepping solution back to take advantage of its long term bactericidal properties. Vitals:   12/06/17 1323 12/06/17 1327 12/06/17 1335 12/06/17 1345  BP: 109/78 (!) 132/95 112/67 120/68  Pulse:      Resp: 19 18 14 12   Temp:      SpO2: 96% 96% 97% 98%  Weight:      Height:        Start Time: 1320 hrs. End Time: 1327 hrs. Materials:  Needle(s) Type: Regular needle Gauge: 22G Length: 3.5-in Medication(s): Please see orders for medications and dosing details.  Imaging Guidance:  Type of Imaging Technique: None used Indication(s): N/A Exposure Time: No patient exposure Contrast: None used. Fluoroscopic Guidance: N/A Ultrasound Guidance: N/A Interpretation: N/A  Antibiotic Prophylaxis:   Anti-infectives (From admission, onward)   None     Indication(s): None identified  Post-operative Assessment:  Post-procedure Vital Signs:  Pulse Rate: 89 Temp: 99.2 F (37.3 C) Resp: 12 BP: 120/68 SpO2: 98 %  EBL: None  Complications: No immediate post-treatment complications observed by team, or reported by patient.  Note: The patient tolerated the entire procedure well. A repeat set of vitals were taken after  the procedure and the patient was kept under observation following institutional policy, for this type of procedure. Post-procedural neurological assessment was performed, showing return to baseline, prior to discharge. The patient was provided with post-procedure discharge instructions, including a section on how to identify potential problems. Should any problems arise concerning this procedure, the patient was given instructions to immediately contact us, at any time, without hesitation. In any case, we plan to contact the patient by telephone for a follow-up status report regarding this interventional procedure.  Comments:  No additional relevant information.  Plan of Care    Imaging Orders     DG C-Arm 1-60 Min-No Report  Procedure Orders     KNEE INJECTION     KNEE INJECTION  Medications ordered for procedure: Meds ordered this encounter  Medications  . lidocaine (XYLOCAINE) 2 % (with pres) injection 400 mg  . midazolam (VERSED) 5 MG/5ML injection 1-2 mg    Make sure Flumazenil is available in the pyxis when using this medication. If oversedation occurs, administer 0.2 mg IV over 15 sec. If after 45 sec no response, administer 0.2 mg again over 1 min; may repeat at 1 min intervals; not to exceed 4 doses (1 mg)  . fentaNYL (SUBLIMAZE) injection 25-50 mcg    Make sure Narcan is available in the pyxis when using this medication. In the event of respiratory depression (RR< 8/min): Titrate NARCAN (naloxone) in increments of 0.1 to 0.2 mg IV at 2-3 minute intervals, until desired degree of reversal.  . lactated ringers infusion 1,000 mL  . ropivacaine (PF) 2 mg/mL (0.2%) (NAROPIN) injection 4 mL  . Sodium Hyaluronate SOSY 2 mL  . Sodium Hyaluronate SOSY 2 mL  . lidocaine (PF) (XYLOCAINE) 1 % injection 4 mL   Medications administered: We administered midazolam, fentaNYL, ropivacaine (PF) 2 mg/mL (0.2%), and lidocaine (PF).  See the medical record for exact dosing, route, and time of  administration.  New Prescriptions   No medications on file   Disposition: Discharge home  Discharge Date & Time: 12/06/2017; 1350 hrs.   Physician-requested Follow-up: Return in about 2 weeks (around 12/20/2017) for Procedure (w/ sedation): (B) Hyalgan #2.  Future Appointments  Date Time Provider [place]  12/20/2017  9:45 AM Milinda Pointer, MD [facility] None   Primary Care  Physician: Idelle Crouch, MD Location: [facility] Note by: Gaspar Cola, MD Date: 12/06/2017; Time: 2:36 PM  Disclaimer:  Medicine is not an Chief Strategy Officer. The only guarantee in medicine is that nothing is guaranteed. It is important to note that the decision to proceed with this intervention was based on the information collected from the patient. The Data and conclusions were drawn from the patient's questionnaire, the interview, and the physical examination. Because the information was provided in large part by the patient, it cannot be guaranteed that it has not been purposely or unconsciously manipulated. Every effort has been made to obtain as much relevant data as possible for this evaluation. It is important to note that the conclusions that lead to this procedure are derived in large part from the available data. Always take into account that the treatment will also be dependent on availability of resources and existing treatment guidelines, considered by other Pain Management Practitioners as being common knowledge and practice, at the time of the intervention. For Medico-Legal purposes, it is also important to point out that variation in procedural techniques and pharmacological choices are the acceptable norm. The indications, contraindications, technique, and results of the above procedure should only be interpreted and judged by a Board-Certified Interventional Pain Specialist with extensive familiarity and expertise in the same exact procedure and technique.

## 2017-12-06 ENCOUNTER — Ambulatory Visit (HOSPITAL_BASED_OUTPATIENT_CLINIC_OR_DEPARTMENT_OTHER): Payer: Medicare HMO | Admitting: Pain Medicine

## 2017-12-06 ENCOUNTER — Ambulatory Visit
Admission: RE | Admit: 2017-12-06 | Discharge: 2017-12-06 | Disposition: A | Discharge status: Home / Self Care | Payer: Medicare HMO | Source: Ambulatory Visit | Attending: Pain Medicine | Admitting: Pain Medicine

## 2017-12-06 ENCOUNTER — Other Ambulatory Visit: Payer: Self-pay

## 2017-12-06 ENCOUNTER — Encounter: Payer: Self-pay | Admitting: Pain Medicine

## 2017-12-06 VITALS — BP 120/68 | HR 89 | Temp 99.2°F | Resp 12 | Ht 64.0 in | Wt 385.0 lb

## 2017-12-06 DIAGNOSIS — M170 Bilateral primary osteoarthritis of knee: Secondary | ICD-10-CM

## 2017-12-06 DIAGNOSIS — M546 Pain in thoracic spine: Secondary | ICD-10-CM | POA: Insufficient documentation

## 2017-12-06 DIAGNOSIS — M25562 Pain in left knee: Secondary | ICD-10-CM | POA: Diagnosis not present

## 2017-12-06 DIAGNOSIS — M25561 Pain in right knee: Secondary | ICD-10-CM | POA: Diagnosis not present

## 2017-12-06 DIAGNOSIS — G8929 Other chronic pain: Secondary | ICD-10-CM | POA: Diagnosis not present

## 2017-12-06 DIAGNOSIS — M545 Low back pain: Secondary | ICD-10-CM | POA: Insufficient documentation

## 2017-12-06 MED ORDER — LIDOCAINE HCL (PF) 1 % IJ SOLN
4.0000 mL | Freq: Once | INTRAMUSCULAR | Status: AC
  Administered 2017-12-06: 5 mL

## 2017-12-06 MED ORDER — LIDOCAINE HCL 2 % IJ SOLN: 20.0000 mL | Freq: Once | INTRAMUSCULAR | Status: DC

## 2017-12-06 MED ORDER — SODIUM HYALURONATE (VISCOSUP) 20 MG/2ML IX SOSY
2.0000 mL | PREFILLED_SYRINGE | Freq: Once | INTRA_ARTICULAR | Status: DC
  Filled 2017-12-06: qty 2

## 2017-12-06 MED ORDER — ROPIVACAINE HCL 2 MG/ML IJ SOLN
4.0000 mL | Freq: Once | INTRAMUSCULAR | Status: AC
  Administered 2017-12-06: 10 mL via INTRA_ARTICULAR

## 2017-12-06 MED ORDER — MIDAZOLAM HCL 5 MG/5ML IJ SOLN
INTRAMUSCULAR | Status: AC
  Filled 2017-12-06: qty 5

## 2017-12-06 MED ORDER — LIDOCAINE HCL (PF) 1 % IJ SOLN
INTRAMUSCULAR | Status: AC
  Filled 2017-12-06: qty 5

## 2017-12-06 MED ORDER — LACTATED RINGERS IV SOLN: 1000.0000 mL | Freq: Once | INTRAVENOUS | Status: DC

## 2017-12-06 MED ORDER — FENTANYL CITRATE (PF) 100 MCG/2ML IJ SOLN
25.0000 ug | INTRAMUSCULAR | Status: DC | PRN
  Administered 2017-12-06: 100 ug via INTRAVENOUS

## 2017-12-06 MED ORDER — MIDAZOLAM HCL 5 MG/5ML IJ SOLN
1.0000 mg | INTRAMUSCULAR | Status: DC | PRN
  Administered 2017-12-06: 2 mg via INTRAVENOUS

## 2017-12-06 MED ORDER — FENTANYL CITRATE (PF) 100 MCG/2ML IJ SOLN
INTRAMUSCULAR | Status: AC
  Filled 2017-12-06: qty 2

## 2017-12-06 MED ORDER — ROPIVACAINE HCL 2 MG/ML IJ SOLN
INTRAMUSCULAR | Status: AC
  Filled 2017-12-06: qty 10

## 2017-12-06 NOTE — Progress Notes (Signed)
Safety precautions to be maintained throughout the outpatient stay will include: orient to surroundings, keep bed in low position, maintain call bell within reach at all times, provide assistance with transfer out of bed and ambulation.  

## 2017-12-06 NOTE — Patient Instructions (Addendum)
____________________________________________________________________________________________  Post-Procedure Discharge Instructions  Instructions:  Apply ice: Fill a plastic sandwich bag with crushed ice. Cover it with a small towel and apply to injection site. Apply for 15 minutes then remove x 15 minutes. Repeat sequence on day of procedure, until you go to bed. The purpose is to minimize swelling and discomfort after procedure.  Apply heat: Apply heat to procedure site starting the day following the procedure. The purpose is to treat any soreness and discomfort from the procedure.  Food intake: Start with clear liquids (like water) and advance to regular food, as tolerated.   Physical activities: Keep activities to a minimum for the first 8 hours after the procedure.   Driving: If you have received any sedation, you are not allowed to drive for 24 hours after your procedure.  Blood thinner: Restart your blood thinner 6 hours after your procedure. (Only for those taking blood thinners)  Insulin: As soon as you can eat, you may resume your normal dosing schedule. (Only for those taking insulin)  Infection prevention: Keep procedure site clean and dry.  Post-procedure Pain Diary: Extremely important that this be done correctly and accurately. Recorded information will be used to determine the next step in treatment.  Pain evaluated is that of treated area only. Do not include pain from an untreated area.  Complete every hour, on the hour, for the initial 8 hours. Set an alarm to help you do this part accurately.  Do not go to sleep and have it completed later. It will not be accurate.  Follow-up appointment: Keep your follow-up appointment after the procedure. Usually 2 weeks for most procedures. (6 weeks in the case of radiofrequency.) Bring you pain diary.   Expect:  From numbing medicine (AKA: Local Anesthetics): Numbness or decrease in pain.  Onset: Full effect within 15  minutes of injected.  Duration: It will depend on the type of local anesthetic used. On the average, 1 to 8 hours.   From steroids: Decrease in swelling or inflammation. Once inflammation is improved, relief of the pain will follow.  Onset of benefits: Depends on the amount of swelling present. The more swelling, the longer it will take for the benefits to be seen. In some cases, up to 10 days.  Duration: Steroids will stay in the system x 2 weeks. Duration of benefits will depend on multiple posibilities including persistent irritating factors.  From procedure: Some discomfort is to be expected once the numbing medicine wears off. This should be minimal if ice and heat are applied as instructed.  Call if:  You experience numbness and weakness that gets worse with time, as opposed to wearing off.  New onset bowel or bladder incontinence. (This applies to Spinal procedures only)  Emergency Numbers:  Durning business hours (Monday - Thursday, 8:00 AM - 4:00 PM) (Friday, 9:00 AM - 12:00 Noon): [phone]  After hours: [phone] ____________________________________________________________________________________________   ____________________________________________________________________________________________  Preparing for your procedure (without sedation)  Instructions: . Oral Intake: Do not eat or drink anything for at least 3 hours prior to your procedure. . Transportation: Unless otherwise stated by your physician, you may drive yourself after the procedure. . Blood Pressure Medicine: Take your blood pressure medicine with a sip of water the morning of the procedure. . Blood thinners:  . Diabetics on insulin: Notify the staff so that you can be scheduled 1st case in the morning. If your diabetes requires high dose insulin, take only  of your normal insulin dose   the morning of the procedure and notify the staff that you have done so. . Preventing infections: Shower  with an antibacterial soap the morning of your procedure.  . Build-up your immune system: Take 1000 mg of Vitamin C with every meal (3 times a day) the day prior to your procedure. . Antibiotics: Inform the staff if you have a condition or reason that requires you to take antibiotics before dental procedures. . Pregnancy: If you are pregnant, call and cancel the procedure. . Sickness: If you have a cold, fever, or any active infections, call and cancel the procedure. . Arrival: You must be in the facility at least 30 minutes prior to your scheduled procedure. . Children: Do not bring any children with you. . Dress appropriately: Bring dark clothing that you would not mind if they get stained. . Valuables: Do not bring any jewelry or valuables.  Procedure appointments are reserved for interventional treatments only. . No Prescription Refills. . No medication changes will be discussed during procedure appointments. . No disability issues will be discussed.  Remember:  Regular Business hours are:  Monday to Thursday 8:00 AM to 4:00 PM  Provider's Schedule: Ingra Rother, MD:  Procedure days: Tuesday and Thursday 7:30 AM to 4:00 PM  Bilal Lateef, MD:  Procedure days: Monday and Wednesday 7:30 AM to 4:00 PM ____________________________________________________________________________________________    

## 2017-12-07 ENCOUNTER — Telehealth: Payer: Self-pay | Admitting: *Deleted

## 2017-12-07 NOTE — Telephone Encounter (Signed)
Denies complications post procedure. 

## 2017-12-19 DIAGNOSIS — Z0120 Encounter for dental examination and cleaning without abnormal findings: Secondary | ICD-10-CM | POA: Insufficient documentation

## 2017-12-20 ENCOUNTER — Ambulatory Visit: Payer: Medicare HMO | Admitting: Pain Medicine

## 2017-12-20 DIAGNOSIS — E1169 Type 2 diabetes mellitus with other specified complication: Secondary | ICD-10-CM | POA: Diagnosis not present

## 2017-12-20 DIAGNOSIS — R300 Dysuria: Secondary | ICD-10-CM | POA: Diagnosis not present

## 2017-12-20 DIAGNOSIS — Z79899 Other long term (current) drug therapy: Secondary | ICD-10-CM | POA: Diagnosis not present

## 2017-12-20 DIAGNOSIS — E785 Hyperlipidemia, unspecified: Secondary | ICD-10-CM | POA: Diagnosis not present

## 2017-12-20 DIAGNOSIS — N390 Urinary tract infection, site not specified: Secondary | ICD-10-CM | POA: Diagnosis not present

## 2017-12-20 DIAGNOSIS — E079 Disorder of thyroid, unspecified: Secondary | ICD-10-CM | POA: Diagnosis not present

## 2018-01-02 DIAGNOSIS — M174 Other bilateral secondary osteoarthritis of knee: Secondary | ICD-10-CM | POA: Insufficient documentation

## 2018-01-02 NOTE — Progress Notes (Signed)
Patient's Name: Karen Lewis  MRN: [id number]  Referring Provider: Idelle Crouch, MD  DOB: May 22, 1971  PCP: Karen Crouch, MD  DOS: 01/03/2018  Note by: Karen Cola, MD  Service setting: Ambulatory outpatient  Specialty: Interventional Pain Management  Patient type: Established  Location: [facility] (AMB) Pain Management Facility  Visit type: Interventional Procedure   Primary Reason for Visit: Interventional Pain Management Treatment. CC: Knee Pain (Bilateral) and Shoulder Pain (left)  Procedure:  Anesthesia, Analgesia, Anxiolysis:  Type: Therapeutic Intra-Articular Hyalgan Knee Injection #2  Region: Lateral infrapatellar Knee Region Level: Knee Joint Laterality: Bilateral  Type: Moderate (Conscious) Sedation combined with Local Anesthesia Indication(s): Analgesia and Anxiety Local Anesthetic: Lidocaine 1-2% Route: Intravenous (IV) IV Access: Secured Sedation: Meaningful verbal contact was maintained at all times during the procedure    Indications: 1. Secondary osteoarthritis of knee (Bilateral)   2. Chronic knee pain (Primary Source of Pain) (Bilateral) (R>L)   3. Osteoarthritis    Pain Score: Pre-procedure: 5 /10 Post-procedure: 0-No pain/10  Post-Procedure Assessment  12/06/2017 Procedure: Therapeutic bilateral intra-articular Hyalgan knee injection #1 (series 2) under fluoroscopic guidance and IV sedation Pre-procedure pain score:  6/10 Post-procedure pain score: 4/10 (< 50% relief) Influential Factors: BMI: 66.09 kg/m Intra-procedural challenges: None observed.         Assessment challenges: None detected.              Reported side-effects: None.        Post-procedural adverse reactions or complications: None reported         Sedation: Sedation provided. When no sedatives are used, the analgesic levels obtained are directly associated to the effectiveness of the local anesthetics. However, when sedation is provided, the level of analgesia obtained during the  initial 1 hour following the intervention, is believed to be the result of a combination of factors. These factors may include, but are not limited to: 1. The effectiveness of the local anesthetics used. 2. The effects of the analgesic(s) and/or anxiolytic(s) used. 3. The degree of discomfort experienced by the patient at the time of the procedure. 4. The patients ability and reliability in recalling and recording the events. 5. The presence and influence of possible secondary gains and/or psychosocial factors. Reported result: Relief experienced during the 1st hour after the procedure: 100 % (Ultra-Short Term Relief)            Interpretative annotation: Clinically appropriate result. Analgesia during this period is likely to be Local Anesthetic and/or IV Sedative (Analgesic/Anxiolytic) related.          Effects of local anesthetic: The analgesic effects attained during this period are directly associated to the localized infiltration of local anesthetics and therefore cary significant diagnostic value as to the etiological location, or anatomical origin, of the pain. Expected duration of relief is directly dependent on the pharmacodynamics of the local anesthetic used. Long-acting (4-6 hours) anesthetics used.  Reported result: Relief during the next 4 to 6 hour after the procedure: 50 % (Short-Term Relief)            Interpretative annotation: Clinically appropriate result. Analgesia during this period is likely to be Local Anesthetic-related.          Long-term benefit: Defined as the period of time past the expected duration of local anesthetics (1 hour for short-acting and 4-6 hours for long-acting). With the possible exception of prolonged sympathetic blockade from the local anesthetics, benefits during this period are typically attributed to, or associated with, other factors  such as analgesic sensory neuropraxia, antiinflammatory effects, or beneficial biochemical changes provided by agents  other than the local anesthetics.  Reported result: Extended relief following procedure: 60 % (Long-Term Relief)            Interpretative annotation: Clinically appropriate result. Good relief. No permanent benefit expected. Inflammation plays a part in the etiology to the pain.          Current benefits: Defined as reported results that persistent at this point in time.   Analgesia: 60 % Karen Lewis reports improvement of arthralgia. Function: Somewhat improved ROM: Somewhat improved Interpretative annotation: Persistent symptomatology. Therapeutic benefit observed. Effective therapeutic approach.          Interpretation: Results would suggest a successful palliative intervention.                  Plan:  Proceed with treatment No.: 2          Controlled Substance Pharmacotherapy Assessment REMS (Risk Evaluation and Mitigation Strategy)  Analgesic: Oxycodone IR 5 mg, 5x/day (25 mg/day) MME/day:37.5 mg/day. Karen Specking, RN  01/03/2018  9:23 AM  Sign at close encounter Safety precautions to be maintained throughout the outpatient stay will include: orient to surroundings, keep bed in low position, maintain call bell within reach at all times, provide assistance with transfer out of bed and ambulation.    Pharmacokinetics: Liberation and absorption (onset of action): WNL Distribution (time to peak effect): WNL Metabolism and excretion (duration of action): WNL         Pharmacodynamics: Desired effects: Analgesia: Karen Lewis reports >50% benefit. Functional ability: Patient reports that medication allows her to accomplish basic ADLs Clinically meaningful improvement in function (CMIF): Sustained CMIF goals met Perceived effectiveness: Described as relatively effective, allowing for increase in activities of daily living (ADL) Undesirable effects: Side-effects or Adverse reactions: None reported Monitoring: Karen Lewis PMP: Online review of the past 60-monthperiod conducted. Compliant with  practice rules and regulations Last UDS on record: Summary  Date Value Ref Range Status  02/17/2017 FINAL  Final    Comment:    ==================================================================== TOXASSURE SELECT 13 (MW) ==================================================================== Test                             Result       Flag       Units Drug Present and Declared for Prescription Verification   Oxycodone                      135          EXPECTED   ng/mg creat   Noroxycodone                   1816         EXPECTED   ng/mg creat    Sources of oxycodone include scheduled prescription medications.    Noroxycodone is an expected metabolite of oxycodone. Drug Absent but Declared for Prescription Verification   Codeine                        Not Detected UNEXPECTED ng/mg creat   Butalbital                     Not Detected UNEXPECTED ==================================================================== Test                      Result  Flag   Units      Ref Range   Creatinine              167              mg/dL      >=20 ==================================================================== Declared Medications:  The flagging and interpretation on this report are based on the  following declared medications.  Unexpected results may arise from  inaccuracies in the declared medications.  **Note: The testing scope of this panel includes these medications:  Butalbital (Butalbital/APAP/Caffeine)  Codeine  Oxycodone  **Note: The testing scope of this panel does not include following  reported medications:  Acetaminophen  Acetaminophen (Butalbital/APAP/Caffeine)  Acyclovir  Albuterol  Albuterol (Ipratropium-Albuterol)  Amitriptyline  Azelastine  Caffeine (Butalbital/APAP/Caffeine)  Cephalexin  Cyanocobalamin  Gabapentin  Glipizide  Guaifenesin  Ipratropium (Ipratropium-Albuterol)  Ketoconazole  Levocetirizine  Levothyroxine  Magnesium Oxide  Meclizine   Medroxyprogesterone  Metformin  Metoprolol  Montelukast  Multivitamin  Omeprazole  Phentermine  Quetiapine  Ranitidine  Rosuvastatin  Sertraline  Spironolactone  Sucralfate  Tizanidine  Triamcinolone acetonide  Vitamin D2 (Ergocalciferol)  Zonisamide ==================================================================== For clinical consultation, please call [phone]. ====================================================================    UDS interpretation: Compliant          Medication Assessment Form: Reviewed. Patient indicates being compliant with therapy Treatment compliance: Compliant Risk Assessment Profile: Aberrant behavior: See prior evaluations. None observed or detected today Comorbid factors increasing risk of overdose: See prior notes. No additional risks detected today Risk of substance use disorder (SUD): Low Opioid Risk Tool - 01/03/18 0920      Personal History of Substance Abuse   Alcohol  Negative    Illegal Drugs  Negative      Total Score   Opioid Risk Tool Scoring  0    Opioid Risk Interpretation  Low Risk      ORT Scoring interpretation table:  Score <3 = Low Risk for SUD  Score between 4-7 = Moderate Risk for SUD  Score >8 = High Risk for Opioid Abuse   Risk Mitigation Strategies:  Patient Counseling: Covered Patient-Prescriber Agreement (PPA): Present and active  Notification to other healthcare providers: Done  Pharmacologic Plan: No change in therapy, at this time.              Pre-op Assessment:  Ms. Gannett is a 47 y.o. (year old), female patient, seen today for interventional treatment. She  has a past surgical history that includes Laparoscopic partial gastrectomy; Shoulder arthroscopy (Right); Joint replacement (Left, hip); Carpal tunnel release (Bilateral); Diagnostic laparoscopy; Cholecystectomy; Trigger finger release (Right); Thyroidectomy (N/A, 11/12/2015); left trigger finger; Roux-en-Y Gastric Bypass (06/03/2017); and Hiatal  hernia repair. Ms. Stangelo has a current medication list which includes the following prescription(s): acetaminophen, acyclovir, albuterol, accu-chek aviva, accu-chek aviva plus, butalbital-acetaminophen-caffeine, guaifenesin-codeine, ipratropium-albuterol, ketoconazole, simple diagnostics lancing dev, levocetirizine, levothyroxine, meclizine, medroxyprogesterone, metformin, metoprolol tartrate, montelukast, multivitamin with minerals, naloxone, omeprazole, oxycodone, quetiapine, ranitidine, rosuvastatin, sertraline, spironolactone, tizanidine, triamcinolone, triamcinolone, true metrix blood glucose test, ulticare alcohol swabs, vitamin d (ergocalciferol), zonisamide, gabapentin, levothyroxine, magnesium oxide, oxycodone, oxycodone, phentermine, and ranitidine, and the following Facility-Administered Medications: fentanyl and midazolam. Her primarily concern today is the Knee Pain (Bilateral) and Shoulder Pain (left)  Initial Vital Signs:  Pulse/HCG Rate: 85ECG Heart Rate: 91 Temp: 98.4 F (36.9 C) Resp: 16 BP: 134/72 SpO2: 100 %  BMI: Estimated body mass index is 66.09 kg/m as calculated from the following:   Height as of this encounter: '5\' 4"'  (1.626 m).  Weight as of this encounter: 385 lb (174.6 kg).  Risk Assessment: Allergies: Reviewed. She is allergic to bactrim [sulfamethoxazole-trimethoprim]; xolair [omalizumab]; ciprofloxacin; shellfish allergy; aspirin; clindamycin; motrin [ibuprofen]; nsaids; other; and sulfa antibiotics.  Allergy Precautions: None required Coagulopathies: Reviewed. None identified.  Blood-thinner therapy: None at this time Active Infection(s): Reviewed. None identified. Ms. Tisby is afebrile  Site Confirmation: Ms. Kunkler was asked to confirm the procedure and laterality before marking the site Procedure checklist: Completed Consent: Before the procedure and under the influence of no sedative(s), amnesic(s), or anxiolytics, the patient was informed of the treatment  options, risks and possible complications. To fulfill our ethical and legal obligations, as recommended by the American Medical Association's Code of Ethics, I have informed the patient of my clinical impression; the nature and purpose of the treatment or procedure; the risks, benefits, and possible complications of the intervention; the alternatives, including doing nothing; the risk(s) and benefit(s) of the alternative treatment(s) or procedure(s); and the risk(s) and benefit(s) of doing nothing. The patient was provided information about the general risks and possible complications associated with the procedure. These may include, but are not limited to: failure to achieve desired goals, infection, bleeding, organ or nerve damage, allergic reactions, paralysis, and death. In addition, the patient was informed of those risks and complications associated to the procedure, such as failure to decrease pain; infection; bleeding; organ or nerve damage with subsequent damage to sensory, motor, and/or autonomic systems, resulting in permanent pain, numbness, and/or weakness of one or several areas of the body; allergic reactions; (i.e.: anaphylactic reaction); and/or death. Furthermore, the patient was informed of those risks and complications associated with the medications. These include, but are not limited to: allergic reactions (i.e.: anaphylactic or anaphylactoid reaction(s)); adrenal axis suppression; blood sugar elevation that in diabetics may result in ketoacidosis or comma; water retention that in patients with history of congestive heart failure may result in shortness of breath, pulmonary edema, and decompensation with resultant heart failure; weight gain; swelling or edema; medication-induced neural toxicity; particulate matter embolism and blood vessel occlusion with resultant organ, and/or nervous system infarction; and/or aseptic necrosis of one or more joints. Finally, the patient was informed that  Medicine is not an exact science; therefore, there is also the possibility of unforeseen or unpredictable risks and/or possible complications that may result in a catastrophic outcome. The patient indicated having understood very clearly. We have given the patient no guarantees and we have made no promises. Enough time was given to the patient to ask questions, all of which were answered to the patient's satisfaction. Ms. Bhullar has indicated that she wanted to continue with the procedure. Attestation: I, the ordering provider, attest that I have discussed with the patient the benefits, risks, side-effects, alternatives, likelihood of achieving goals, and potential problems during recovery for the procedure that I have provided informed consent. Date  Time: 01/03/2018  9:13 AM  Pre-Procedure Preparation:  Monitoring: As per clinic protocol. Respiration, ETCO2, SpO2, BP, heart rate and rhythm monitor placed and checked for adequate function Safety Precautions: Patient was assessed for positional comfort and pressure points before starting the procedure. Time-out: I initiated and conducted the "Time-out" before starting the procedure, as per protocol. The patient was asked to participate by confirming the accuracy of the "Time Out" information. Verification of the correct person, site, and procedure were performed and confirmed by me, the nursing staff, and the patient. "Time-out" conducted as per Joint Commission's Universal Protocol (UP.01.01.01). Time: 1042  Description of Procedure:  Position: Sitting Target Area: Knee Joint Approach: Just above the Lateral tibial plateau, lateral to the infrapatellar tendon. Area Prepped: Entire knee area, from the mid-thigh to the mid-shin. Prepping solution: ChloraPrep (2% chlorhexidine gluconate and 70% isopropyl alcohol) Safety Precautions: Aspiration looking for blood return was conducted prior to all injections. At no point did we inject any substances,  as a needle was being advanced. No attempts were made at seeking any paresthesias. Safe injection practices and needle disposal techniques used. Medications properly checked for expiration dates. SDV (single dose vial) medications used. Description of the Procedure: Protocol guidelines were followed. The patient was placed in position over the fluoroscopy table. The target area was identified and the area prepped in the usual manner. Skin desensitized using vapocoolant spray. Skin & deeper tissues infiltrated with local anesthetic. Appropriate amount of time allowed to pass for local anesthetics to take effect. The procedure needles were then advanced to the target area. Proper needle placement secured. Negative aspiration confirmed. Solution injected in intermittent fashion, asking for systemic symptoms every 0.5cc of injectate. The needles were then removed and the area cleansed, making sure to leave some of the prepping solution back to take advantage of its long term bactericidal properties. Vitals:   01/03/18 1053 01/03/18 1059 01/03/18 1109 01/03/18 1119  BP: 127/72 122/73 119/71 (!) 106/47  Pulse:      Resp: (!) '22 18 19 ' (!) 21  Temp:   98.8 F (37.1 C)   SpO2: 95% 96% 95% 99%  Weight:      Height:        Start Time: 1042 hrs. End Time: 1049 hrs. Materials:  Needle(s) Type: Regular needle Gauge: 22G Length: 3.5-in Medication(s): Please see orders for medications and dosing details.  Imaging Guidance:  Type of Imaging Technique: Fluoroscopy Guidance (Non-spinal) Indication(s): Assistance in needle guidance and placement for procedures requiring needle placement in or near specific anatomical locations not easily accessible without such assistance. Exposure Time: Please see nurses notes. Contrast: None used. Fluoroscopic Guidance: I was personally present during the use of fluoroscopy. "Tunnel Vision Technique" used to obtain the best possible view of the target area. Parallax error  corrected before commencing the procedure. "Direction-depth-direction" technique used to introduce the needle under continuous pulsed fluoroscopy. Once target was reached, antero-posterior, oblique, and lateral fluoroscopic projection used confirm needle placement in all planes. Images permanently stored in EMR. Ultrasound Guidance: N/A Interpretation: No contrast injected. I personally interpreted the imaging intraoperatively. Adequate needle placement confirmed in multiple planes. Permanent images saved into the patient's record.  Antibiotic Prophylaxis:   Anti-infectives (From admission, onward)   None     Indication(s): None identified  Post-operative Assessment:  Post-procedure Vital Signs:  Pulse/HCG Rate: 8582 Temp: 98.8 F (37.1 C) Resp: (!) 21 BP: (!) 106/47 SpO2: 99 %  EBL: None  Complications: No immediate post-treatment complications observed by team, or reported by patient.  Note: The patient tolerated the entire procedure well. A repeat set of vitals were taken after the procedure and the patient was kept under observation following institutional policy, for this type of procedure. Post-procedural neurological assessment was performed, showing return to baseline, prior to discharge. The patient was provided with post-procedure discharge instructions, including a section on how to identify potential problems. Should any problems arise concerning this procedure, the patient was given instructions to immediately contact us, at any time, without hesitation. In any case, we plan to contact the patient by telephone for a follow-up status report regarding this interventional procedure.  Comments:  No additional relevant information.  Plan of Care   Possible POC:  Therapeutic bilateral intra-articular Hyalgan injection #3 under fluoroscopic guidance and IV sedation. In addition, the patient is interested in having her left shoulder injected again.  However, we will try to avoid  doing 3 joint injections in 1 day.  We will consider doing this left shoulder injection in the future.   Imaging Orders     DG C-Arm 1-60 Min-No Report  Procedure Orders     KNEE INJECTION     KNEE INJECTION  Medications ordered for procedure: Meds ordered this encounter  Medications  . lidocaine (XYLOCAINE) 2 % (with pres) injection 400 mg  . midazolam (VERSED) 5 MG/5ML injection 1-2 mg    Make sure Flumazenil is available in the pyxis when using this medication. If oversedation occurs, administer 0.2 mg IV over 15 sec. If after 45 sec no response, administer 0.2 mg again over 1 min; may repeat at 1 min intervals; not to exceed 4 doses (1 mg)  . fentaNYL (SUBLIMAZE) injection 25-50 mcg    Make sure Narcan is available in the pyxis when using this medication. In the event of respiratory depression (RR< 8/min): Titrate NARCAN (naloxone) in increments of 0.1 to 0.2 mg IV at 2-3 minute intervals, until desired degree of reversal.  . lactated ringers infusion 1,000 mL  . lidocaine (PF) (XYLOCAINE) 1 % injection 4 mL  . ropivacaine (PF) 2 mg/mL (0.2%) (NAROPIN) injection 4 mL  . Sodium Hyaluronate SOSY 2 mL  . Sodium Hyaluronate SOSY 2 mL  . oxyCODONE (OXY IR/ROXICODONE) 5 MG immediate release tablet    Sig: Take 1 tablet (5 mg total) by mouth 5 (five) times daily as needed for severe pain.    Dispense:  150 tablet    Refill:  0    Fill one day early if pharmacy is closed on scheduled refill date.  Do not fill until: 01/03/18 To last until: 02/02/18   Medications administered: We administered lidocaine, midazolam, fentaNYL, lactated ringers, lidocaine (PF), ropivacaine (PF) 2 mg/mL (0.2%), Sodium Hyaluronate, and Sodium Hyaluronate.  See the medical record for exact dosing, route, and time of administration.  New Prescriptions   No medications on file   Disposition: Discharge home  Discharge Date & Time: 01/03/2018; 1127 hrs.   Physician-requested Follow-up: Return for PPE (2 wks)  + Procedure (w/ sedation): (B) IA Hyalgan #3.  Future Appointments  Date Time Provider [place]  01/19/2018  8:30 AM Milinda Pointer, MD [facility] None  01/30/2018  1:30 PM Vevelyn Francois, NP [facility] None   Primary Care Physician: Karen Crouch, MD Location: [facility] Note by: Karen Cola, MD Date: 01/03/2018; Time: 12:06 PM  Disclaimer:  Medicine is not an Chief Strategy Officer. The only guarantee in medicine is that nothing is guaranteed. It is important to note that the decision to proceed with this intervention was based on the information collected from the patient. The Data and conclusions were drawn from the patient's questionnaire, the interview, and the physical examination. Because the information was provided in large part by the patient, it cannot be guaranteed that it has not been purposely or unconsciously manipulated. Every effort has been made to obtain as much relevant data as possible for this evaluation. It is important to note that the conclusions that lead to this procedure are derived in large part from the available data. Always take into account that the treatment will also be dependent  on availability of resources and existing treatment guidelines, considered by other Pain Management Practitioners as being common knowledge and practice, at the time of the intervention. For Medico-Legal purposes, it is also important to point out that variation in procedural techniques and pharmacological choices are the acceptable norm. The indications, contraindications, technique, and results of the above procedure should only be interpreted and judged by a Board-Certified Interventional Pain Specialist with extensive familiarity and expertise in the same exact procedure and technique.

## 2018-01-03 ENCOUNTER — Encounter: Payer: Self-pay | Admitting: Pain Medicine

## 2018-01-03 ENCOUNTER — Ambulatory Visit
Admission: RE | Admit: 2018-01-03 | Discharge: 2018-01-03 | Disposition: A | Discharge status: Home / Self Care | Payer: Medicare HMO | Source: Ambulatory Visit | Attending: Pain Medicine | Admitting: Pain Medicine

## 2018-01-03 ENCOUNTER — Ambulatory Visit (HOSPITAL_BASED_OUTPATIENT_CLINIC_OR_DEPARTMENT_OTHER): Payer: Medicare HMO | Admitting: Pain Medicine

## 2018-01-03 ENCOUNTER — Other Ambulatory Visit: Payer: Self-pay

## 2018-01-03 VITALS — BP 106/47 | HR 85 | Temp 98.8°F | Resp 21 | Ht 64.0 in | Wt 385.0 lb

## 2018-01-03 DIAGNOSIS — Z79890 Hormone replacement therapy (postmenopausal): Secondary | ICD-10-CM | POA: Diagnosis not present

## 2018-01-03 DIAGNOSIS — M25562 Pain in left knee: Secondary | ICD-10-CM | POA: Diagnosis present

## 2018-01-03 DIAGNOSIS — Z7984 Long term (current) use of oral hypoglycemic drugs: Secondary | ICD-10-CM | POA: Diagnosis not present

## 2018-01-03 DIAGNOSIS — G894 Chronic pain syndrome: Secondary | ICD-10-CM

## 2018-01-03 DIAGNOSIS — M25512 Pain in left shoulder: Secondary | ICD-10-CM | POA: Diagnosis not present

## 2018-01-03 DIAGNOSIS — M150 Primary generalized (osteo)arthritis: Secondary | ICD-10-CM

## 2018-01-03 DIAGNOSIS — M174 Other bilateral secondary osteoarthritis of knee: Secondary | ICD-10-CM | POA: Diagnosis not present

## 2018-01-03 DIAGNOSIS — M159 Polyosteoarthritis, unspecified: Secondary | ICD-10-CM

## 2018-01-03 DIAGNOSIS — G8929 Other chronic pain: Secondary | ICD-10-CM

## 2018-01-03 DIAGNOSIS — Z79899 Other long term (current) drug therapy: Secondary | ICD-10-CM | POA: Insufficient documentation

## 2018-01-03 DIAGNOSIS — M25561 Pain in right knee: Secondary | ICD-10-CM | POA: Diagnosis present

## 2018-01-03 MED ORDER — LIDOCAINE HCL 2 % IJ SOLN
20.0000 mL | Freq: Once | INTRAMUSCULAR | Status: AC
  Administered 2018-01-03: 400 mg
  Filled 2018-01-03: qty 20

## 2018-01-03 MED ORDER — FENTANYL CITRATE (PF) 100 MCG/2ML IJ SOLN
25.0000 ug | INTRAMUSCULAR | Status: DC | PRN
  Administered 2018-01-03: 100 ug via INTRAVENOUS
  Filled 2018-01-03: qty 2

## 2018-01-03 MED ORDER — ROPIVACAINE HCL 2 MG/ML IJ SOLN
4.0000 mL | Freq: Once | INTRAMUSCULAR | Status: AC
  Administered 2018-01-03: 10 mL via INTRA_ARTICULAR
  Filled 2018-01-03: qty 10

## 2018-01-03 MED ORDER — SODIUM HYALURONATE (VISCOSUP) 20 MG/2ML IX SOSY
2.0000 mL | PREFILLED_SYRINGE | Freq: Once | INTRA_ARTICULAR | Status: AC
  Administered 2018-01-03: 11:00:00 via INTRA_ARTICULAR

## 2018-01-03 MED ORDER — OXYCODONE HCL 5 MG PO TABS: 5.0000 mg | ORAL_TABLET | Freq: Every day | ORAL | 0 refills | Status: DC | PRN

## 2018-01-03 MED ORDER — LIDOCAINE HCL (PF) 1 % IJ SOLN
4.0000 mL | Freq: Once | INTRAMUSCULAR | Status: AC
  Administered 2018-01-03: 5 mL
  Filled 2018-01-03: qty 5

## 2018-01-03 MED ORDER — MIDAZOLAM HCL 5 MG/5ML IJ SOLN
1.0000 mg | INTRAMUSCULAR | Status: DC | PRN
  Administered 2018-01-03: 3 mg via INTRAVENOUS
  Filled 2018-01-03: qty 5

## 2018-01-03 MED ORDER — LACTATED RINGERS IV SOLN
1000.0000 mL | Freq: Once | INTRAVENOUS | Status: AC
  Administered 2018-01-03: 1000 mL via INTRAVENOUS

## 2018-01-03 NOTE — Progress Notes (Signed)
Safety precautions to be maintained throughout the outpatient stay will include: orient to surroundings, keep bed in low position, maintain call bell within reach at all times, provide assistance with transfer out of bed and ambulation.  

## 2018-01-03 NOTE — Patient Instructions (Addendum)
____________________________________________________________________________________________  Post-Procedure Discharge Instructions  Instructions:  Apply ice: Fill a plastic sandwich bag with crushed ice. Cover it with a small towel and apply to injection site. Apply for 15 minutes then remove x 15 minutes. Repeat sequence on day of procedure, until you go to bed. The purpose is to minimize swelling and discomfort after procedure.  Apply heat: Apply heat to procedure site starting the day following the procedure. The purpose is to treat any soreness and discomfort from the procedure.  Food intake: Start with clear liquids (like water) and advance to regular food, as tolerated.   Physical activities: Keep activities to a minimum for the first 8 hours after the procedure.   Driving: If you have received any sedation, you are not allowed to drive for 24 hours after your procedure.  Blood thinner: Restart your blood thinner 6 hours after your procedure. (Only for those taking blood thinners)  Insulin: As soon as you can eat, you may resume your normal dosing schedule. (Only for those taking insulin)  Infection prevention: Keep procedure site clean and dry.  Post-procedure Pain Diary: Extremely important that this be done correctly and accurately. Recorded information will be used to determine the next step in treatment.  Pain evaluated is that of treated area only. Do not include pain from an untreated area.  Complete every hour, on the hour, for the initial 8 hours. Set an alarm to help you do this part accurately.  Do not go to sleep and have it completed later. It will not be accurate.  Follow-up appointment: Keep your follow-up appointment after the procedure. Usually 2 weeks for most procedures. (6 weeks in the case of radiofrequency.) Bring you pain diary.   Expect:  From numbing medicine (AKA: Local Anesthetics): Numbness or decrease in pain.  Onset: Full effect within 15  minutes of injected.  Duration: It will depend on the type of local anesthetic used. On the average, 1 to 8 hours.   From steroids: Decrease in swelling or inflammation. Once inflammation is improved, relief of the pain will follow.  Onset of benefits: Depends on the amount of swelling present. The more swelling, the longer it will take for the benefits to be seen. In some cases, up to 10 days.  Duration: Steroids will stay in the system x 2 weeks. Duration of benefits will depend on multiple posibilities including persistent irritating factors.  From procedure: Some discomfort is to be expected once the numbing medicine wears off. This should be minimal if ice and heat are applied as instructed.  Call if:  You experience numbness and weakness that gets worse with time, as opposed to wearing off.  New onset bowel or bladder incontinence. (This applies to Spinal procedures only)  Emergency Numbers:  Durning business hours (Monday - Thursday, 8:00 AM - 4:00 PM) (Friday, 9:00 AM - 12:00 Noon): [phone]  After hours: [phone] ____________________________________________________________________________________________   ____________________________________________________________________________________________  Preparing for Procedure with Sedation  Instructions: . Oral Intake: Do not eat or drink anything for at least 8 hours prior to your procedure. . Transportation: Public transportation is not allowed. Bring an adult driver. The driver must be physically present in our waiting room before any procedure can be started. Marland Kitchen Physical Assistance: Bring an adult physically capable of assisting you, in the event you need help. This adult should keep you company at home for at least 6 hours after the procedure. . Blood Pressure Medicine: Take your blood pressure medicine with a sip  of water the morning of the procedure. . Blood thinners:  . Diabetics on insulin: Notify the  staff so that you can be scheduled 1st case in the morning. If your diabetes requires high dose insulin, take only  of your normal insulin dose the morning of the procedure and notify the staff that you have done so. . Preventing infections: Shower with an antibacterial soap the morning of your procedure. . Build-up your immune system: Take 1000 mg of Vitamin C with every meal (3 times a day) the day prior to your procedure. Marland Kitchen Antibiotics: Inform the staff if you have a condition or reason that requires you to take antibiotics before dental procedures. . Pregnancy: If you are pregnant, call and cancel the procedure. . Sickness: If you have a cold, fever, or any active infections, call and cancel the procedure. . Arrival: You must be in the facility at least 30 minutes prior to your scheduled procedure. . Children: Do not bring children with you. . Dress appropriately: Bring dark clothing that you would not mind if they get stained. . Valuables: Do not bring any jewelry or valuables.  Procedure appointments are reserved for interventional treatments only. Marland Kitchen No Prescription Refills. . No medication changes will be discussed during procedure appointments. . No disability issues will be discussed.  Remember:  Regular Business hours are:  Monday to Thursday 8:00 AM to 4:00 PM  Provider's Schedule: Milinda Pointer, MD:  Procedure days: Tuesday and Thursday 7:30 AM to 4:00 PM  Gillis Santa, MD:  Procedure days: Monday and Wednesday 7:30 AM to 4:00 PM ____________________________________________________________________________________________   ____________________________________________________________________________________________  Medication Rules  Applies to: All patients receiving prescriptions (written or electronic).  Pharmacy of record: Pharmacy where electronic prescriptions will be sent. If written prescriptions are taken to a different pharmacy, please inform the nursing  staff. The pharmacy listed in the electronic medical record should be the one where you would like electronic prescriptions to be sent.  Prescription refills: Only during scheduled appointments. Applies to both, written and electronic prescriptions.  NOTE: The following applies primarily to controlled substances (Opioid* Pain Medications).   Patient's responsibilities: 1. Pain Pills: Bring all pain pills to every appointment (except for procedure appointments). 2. Pill Bottles: Bring pills in original pharmacy bottle. Always bring newest bottle. Bring bottle, even if empty. 3. Medication refills: You are responsible for knowing and keeping track of what medications you need refilled. The day before your appointment, write a list of all prescriptions that need to be refilled. Bring that list to your appointment and give it to the admitting nurse. Prescriptions will be written only during appointments. If you forget a medication, it will not be "Called in", "Faxed", or "electronically sent". You will need to get another appointment to get these prescribed. 4. Prescription Accuracy: You are responsible for carefully inspecting your prescriptions before leaving our office. Have the discharge nurse carefully go over each prescription with you, before taking them home. Make sure that your name is accurately spelled, that your address is correct. Check the name and dose of your medication to make sure it is accurate. Check the number of pills, and the written instructions to make sure they are clear and accurate. Make sure that you are given enough medication to last until your next medication refill appointment. 5. Taking Medication: Take medication as prescribed. Never take more pills than instructed. Never take medication more frequently than prescribed. Taking less pills or less frequently is permitted and encouraged, when it comes  to controlled substances (written prescriptions).  6. Inform other Doctors:  Always inform, all of your healthcare providers, of all the medications you take. 7. Pain Medication from other Providers: You are not allowed to accept any additional pain medication from any other Doctor or Healthcare provider. There are two exceptions to this rule. (see below) In the event that you require additional pain medication, you are responsible for notifying us, as stated below. 8. Medication Agreement: You are responsible for carefully reading and following our Medication Agreement. This must be signed before receiving any prescriptions from our practice. Safely store a copy of your signed Agreement. Violations to the Agreement will result in no further prescriptions. (Additional copies of our Medication Agreement are available upon request.) 9. Laws, Rules, & Regulations: All patients are expected to follow all Federal and [employer], [employer], Rules, [employer]. Ignorance of the Laws does not constitute a valid excuse. The use of any illegal substances is prohibited. 10. Adopted CDC guidelines & recommendations: Target dosing levels will be at or below 60 MME/day. Use of benzodiazepines** is not recommended.  Exceptions: There are only two exceptions to the rule of not receiving pain medications from other Healthcare Providers. 1. Exception #1 (Emergencies): In the event of an emergency (i.e.: accident requiring emergency care), you are allowed to receive additional pain medication. However, you are responsible for: As soon as you are able, call our office [phone], at any time of the day or night, and leave a message stating your name, the date and nature of the emergency, and the name and dose of the medication prescribed. In the event that your call is answered by a member of our staff, make sure to document and save the date, time, and the name of the person that took your information.  2. Exception #2 (Planned Surgery): In the event that you are scheduled by another doctor or  dentist to have any type of surgery or procedure, you are allowed (for a period no longer than 30 days), to receive additional pain medication, for the acute post-op pain. However, in this case, you are responsible for picking up a copy of our "Post-op Pain Management for Surgeons" handout, and giving it to your surgeon or dentist. This document is available at our office, and does not require an appointment to obtain it. Simply go to our office during business hours (Monday-Thursday from 8:00 AM to 4:00 PM) (Friday 8:00 AM to 12:00 Noon) or if you have a scheduled appointment with [place], prior to your surgery, and ask for it by name. In addition, you will need to provide [place] with your name, name of your surgeon, type of surgery, and date of procedure or surgery.  *Opioid medications include: morphine, codeine, oxycodone, oxymorphone, hydrocodone, hydromorphone, meperidine, tramadol, tapentadol, buprenorphine, fentanyl, methadone. **Benzodiazepine medications include: diazepam (Valium), alprazolam (Xanax), clonazepam (Klonopine), lorazepam (Ativan), clorazepate (Tranxene), chlordiazepoxide (Librium), estazolam (Prosom), oxazepam (Serax), temazepam (Restoril), triazolam (Halcion) (Last updated: 11/10/2017) ____________________________________________________________________________________________   ____________________________________________________________________________________________  Medication Recommendations and Reminders  Applies to: All patients receiving prescriptions (written and/or electronic).  Medication Rules & Regulations: These rules and regulations exist for your safety and that of others. They are not flexible and neither are we. Dismissing or ignoring them will be considered "non-compliance" with medication therapy, resulting in complete and irreversible termination of such therapy. (See document titled "Medication Rules" for more details.) In all conscience, because of safety  reasons, we cannot continue providing a therapy where the patient does not follow instructions.  Pharmacy of record:   Definition: This is the pharmacy where your electronic prescriptions will be sent.   We do not endorse any particular pharmacy.  You are not restricted in your choice of pharmacy.  The pharmacy listed in the electronic medical record should be the one where you want electronic prescriptions to be sent.  If you choose to change pharmacy, simply notify our nursing staff of your choice of new pharmacy.  Recommendations:  Keep all of your pain medications in a safe place, under lock and key, even if you live alone.   After you fill your prescription, take 1 week's worth of pills and put them away in a safe place. You should keep a separate, properly labeled bottle for this purpose. The remainder should be kept in the original bottle. Use this as your primary supply, until it runs out. Once it's gone, then you know that you have 1 week's worth of medicine, and it is time to come in for a prescription refill. If you do this correctly, it is unlikely that you will ever run out of medicine.  To make sure that the above recommendation works, it is very important that you make sure your medication refill appointments are scheduled at least 1 week before you run out of medicine. To do this in an effective manner, make sure that you do not leave the office without scheduling your next medication management appointment. Always ask the nursing staff to show you in your prescription , when your medication will be running out. Then arrange for the receptionist to get you a return appointment, at least 7 days before you run out of medicine. Do not wait until you have 1 or 2 pills left, to come in. This is very poor planning and does not take into consideration that we may need to cancel appointments due to bad weather, sickness, or emergencies affecting our staff.  Prescription refills and/or  changes in medication(s):   Prescription refills, and/or changes in dose or medication, will be conducted only during scheduled medication management appointments. (Applies to both, written and electronic prescriptions.)  No refills on procedure days. No medication will be changed or started on procedure days. No changes, adjustments, and/or refills will be conducted on a procedure day. Doing so will interfere with the diagnostic portion of the procedure.  No phone refills. No medications will be "called into the pharmacy".  No Fax refills.  No weekend refills.  No Holliday refills.  No after hours refills.  Remember:  Business hours are:  Monday to Thursday 8:00 AM to 4:00 PM Provider's Schedule: Dionisio David, NP - Appointments are:  Medication management: Monday to Thursday 8:00 AM to 4:00 PM Milinda Pointer, MD - Appointments are:  Medication management: Monday and Wednesday 8:00 AM to 4:00 PM Procedure day: Tuesday and Thursday 7:30 AM to 4:00 PM Gillis Santa, MD - Appointments are:  Medication management: Tuesday and Thursday 8:00 AM to 4:00 PM Procedure day: Monday and Wednesday 7:30 AM to 4:00 PM (Last update: 11/10/2017) ____________________________________________________________________________________________   ____________________________________________________________________________________________  CANNABIDIOL (AKA: CBD Oil or Pills)  Applies to: All patients receiving prescriptions of controlled substances (written and/or electronic).  General Information: Cannabidiol (CBD) was discovered in 98. It is one of some 113 identified cannabinoids in cannabis (Marijuana) plants, accounting for up to 40% of the plant's extract. As of 2018, preliminary clinical research on cannabidiol included studies of anxiety, cognition, movement disorders, and pain.  Cannabidiol is consummed in multiple ways, including inhalation  of cannabis smoke or vapor, as an aerosol spray  into the cheek, and by mouth. It may be supplied as CBD oil containing CBD as the active ingredient (no added tetrahydrocannabinol (THC) or terpenes), a full-plant CBD-dominant hemp extract oil, capsules, dried cannabis, or as a liquid solution. CBD is thought not have the same psychoactivity as THC, and may affect the actions of THC. Studies suggest that CBD may interact with different biological targets, including cannabinoid receptors and other neurotransmitter receptors. As of 2018 the mechanism of action for its biological effects has not been determined.  In the [place], cannabidiol has a limited approval by the Food and Drug Administration (FDA) for treatment of only two types of epilepsy disorders. The side effects of long-term use of the drug include somnolence, decreased appetite, diarrhea, fatigue, malaise, weakness, sleeping problems, and others.  CBD remains a Schedule I drug prohibited for any use.  Legality: Some manufacturers ship CBD products nationally, an illegal action which the FDA has not enforced in 2018, with CBD remaining the subject of an FDA investigational new drug evaluation, and is not considered legal as a dietary supplement or food ingredient as of December 2018. Federal illegality has made it difficult historically to conduct research on CBD. CBD is openly sold in head shops and health food stores in some states where such sales have not been explicitly legalized.  Warning: Because it is not FDA approved for general use or treatment of pain, it is not required to undergo the same manufacturing controls as prescription drugs.  This means that the available cannabidiol (CBD) may be contaminated with THC.  If this is the case, it will trigger a positive urine drug screen (UDS) test for cannabinoids (Marijuana).  Because a positive UDS for illicit substances is a violation of our medication agreement, your opioid analgesics (pain medicine) may be permanently  discontinued. (Last update: 12/01/2017) ____________________________________________________________________________________________

## 2018-01-04 ENCOUNTER — Telehealth: Payer: Self-pay

## 2018-01-04 NOTE — Telephone Encounter (Signed)
Post procedure phone call.  Patient states she is doing OK 

## 2018-01-16 DIAGNOSIS — G8929 Other chronic pain: Secondary | ICD-10-CM | POA: Diagnosis not present

## 2018-01-16 DIAGNOSIS — Z9884 Bariatric surgery status: Secondary | ICD-10-CM | POA: Diagnosis not present

## 2018-01-16 DIAGNOSIS — E65 Localized adiposity: Secondary | ICD-10-CM | POA: Diagnosis not present

## 2018-01-16 DIAGNOSIS — M545 Low back pain: Secondary | ICD-10-CM | POA: Diagnosis not present

## 2018-01-16 DIAGNOSIS — R635 Abnormal weight gain: Secondary | ICD-10-CM | POA: Diagnosis not present

## 2018-01-17 DIAGNOSIS — E1159 Type 2 diabetes mellitus with other circulatory complications: Secondary | ICD-10-CM | POA: Diagnosis not present

## 2018-01-17 DIAGNOSIS — E119 Type 2 diabetes mellitus without complications: Secondary | ICD-10-CM | POA: Diagnosis not present

## 2018-01-17 DIAGNOSIS — E1169 Type 2 diabetes mellitus with other specified complication: Secondary | ICD-10-CM | POA: Diagnosis not present

## 2018-01-17 DIAGNOSIS — F411 Generalized anxiety disorder: Secondary | ICD-10-CM | POA: Diagnosis not present

## 2018-01-17 DIAGNOSIS — F1920 Other psychoactive substance dependence, uncomplicated: Secondary | ICD-10-CM | POA: Diagnosis not present

## 2018-01-17 DIAGNOSIS — E785 Hyperlipidemia, unspecified: Secondary | ICD-10-CM | POA: Diagnosis not present

## 2018-01-17 DIAGNOSIS — G4733 Obstructive sleep apnea (adult) (pediatric): Secondary | ICD-10-CM | POA: Diagnosis not present

## 2018-01-17 DIAGNOSIS — N390 Urinary tract infection, site not specified: Secondary | ICD-10-CM | POA: Diagnosis not present

## 2018-01-17 DIAGNOSIS — E890 Postprocedural hypothyroidism: Secondary | ICD-10-CM | POA: Diagnosis not present

## 2018-01-17 DIAGNOSIS — E559 Vitamin D deficiency, unspecified: Secondary | ICD-10-CM | POA: Diagnosis not present

## 2018-01-17 DIAGNOSIS — Z1231 Encounter for screening mammogram for malignant neoplasm of breast: Secondary | ICD-10-CM | POA: Diagnosis not present

## 2018-01-19 ENCOUNTER — Encounter: Payer: Self-pay | Admitting: Pain Medicine

## 2018-01-19 ENCOUNTER — Other Ambulatory Visit: Payer: Self-pay

## 2018-01-19 ENCOUNTER — Ambulatory Visit (HOSPITAL_BASED_OUTPATIENT_CLINIC_OR_DEPARTMENT_OTHER): Payer: Medicare HMO | Admitting: Pain Medicine

## 2018-01-19 ENCOUNTER — Ambulatory Visit
Admission: RE | Admit: 2018-01-19 | Discharge: 2018-01-19 | Disposition: A | Discharge status: Home / Self Care | Payer: Medicare HMO | Source: Ambulatory Visit | Attending: Pain Medicine | Admitting: Pain Medicine

## 2018-01-19 VITALS — BP 120/58 | HR 79 | Temp 98.5°F | Resp 16 | Ht 64.0 in | Wt 381.0 lb

## 2018-01-19 DIAGNOSIS — Z903 Acquired absence of stomach [part of]: Secondary | ICD-10-CM | POA: Diagnosis not present

## 2018-01-19 DIAGNOSIS — Z881 Allergy status to other antibiotic agents status: Secondary | ICD-10-CM | POA: Diagnosis not present

## 2018-01-19 DIAGNOSIS — F32A Depression, unspecified: Secondary | ICD-10-CM | POA: Insufficient documentation

## 2018-01-19 DIAGNOSIS — M174 Other bilateral secondary osteoarthritis of knee: Secondary | ICD-10-CM

## 2018-01-19 DIAGNOSIS — Z886 Allergy status to analgesic agent status: Secondary | ICD-10-CM | POA: Insufficient documentation

## 2018-01-19 DIAGNOSIS — M25562 Pain in left knee: Secondary | ICD-10-CM

## 2018-01-19 DIAGNOSIS — M170 Bilateral primary osteoarthritis of knee: Secondary | ICD-10-CM | POA: Insufficient documentation

## 2018-01-19 DIAGNOSIS — Z9884 Bariatric surgery status: Secondary | ICD-10-CM | POA: Insufficient documentation

## 2018-01-19 DIAGNOSIS — Z882 Allergy status to sulfonamides status: Secondary | ICD-10-CM | POA: Insufficient documentation

## 2018-01-19 DIAGNOSIS — G8929 Other chronic pain: Secondary | ICD-10-CM

## 2018-01-19 DIAGNOSIS — M25561 Pain in right knee: Secondary | ICD-10-CM | POA: Diagnosis present

## 2018-01-19 DIAGNOSIS — Z9049 Acquired absence of other specified parts of digestive tract: Secondary | ICD-10-CM | POA: Diagnosis not present

## 2018-01-19 DIAGNOSIS — F329 Major depressive disorder, single episode, unspecified: Secondary | ICD-10-CM | POA: Insufficient documentation

## 2018-01-19 MED ORDER — LIDOCAINE HCL (PF) 1 % IJ SOLN
4.0000 mL | Freq: Once | INTRAMUSCULAR | Status: AC
  Administered 2018-01-19: 5 mL
  Filled 2018-01-19: qty 5

## 2018-01-19 MED ORDER — ROPIVACAINE HCL 2 MG/ML IJ SOLN
4.0000 mL | Freq: Once | INTRAMUSCULAR | Status: AC
  Administered 2018-01-19: 10 mL via INTRA_ARTICULAR
  Filled 2018-01-19: qty 10

## 2018-01-19 MED ORDER — SODIUM HYALURONATE (VISCOSUP) 20 MG/2ML IX SOSY
2.0000 mL | PREFILLED_SYRINGE | Freq: Once | INTRA_ARTICULAR | Status: AC
  Administered 2018-01-19: 10:00:00 via INTRA_ARTICULAR

## 2018-01-19 MED ORDER — FENTANYL CITRATE (PF) 100 MCG/2ML IJ SOLN
25.0000 ug | INTRAMUSCULAR | Status: DC | PRN
  Administered 2018-01-19: 100 ug via INTRAVENOUS
  Filled 2018-01-19: qty 2

## 2018-01-19 MED ORDER — LACTATED RINGERS IV SOLN
1000.0000 mL | Freq: Once | INTRAVENOUS | Status: AC
  Administered 2018-01-19: 1000 mL via INTRAVENOUS

## 2018-01-19 MED ORDER — MIDAZOLAM HCL 5 MG/5ML IJ SOLN
1.0000 mg | INTRAMUSCULAR | Status: DC | PRN
  Administered 2018-01-19: 3 mg via INTRAVENOUS
  Filled 2018-01-19: qty 5

## 2018-01-19 NOTE — Patient Instructions (Addendum)
____________________________________________________________________________________________  Post-Procedure Discharge Instructions  Instructions:  Apply ice: Fill a plastic sandwich bag with crushed ice. Cover it with a small towel and apply to injection site. Apply for 15 minutes then remove x 15 minutes. Repeat sequence on day of procedure, until you go to bed. The purpose is to minimize swelling and discomfort after procedure.  Apply heat: Apply heat to procedure site starting the day following the procedure. The purpose is to treat any soreness and discomfort from the procedure.  Food intake: Start with clear liquids (like water) and advance to regular food, as tolerated.   Physical activities: Keep activities to a minimum for the first 8 hours after the procedure.   Driving: If you have received any sedation, you are not allowed to drive for 24 hours after your procedure.  Blood thinner: Restart your blood thinner 6 hours after your procedure. (Only for those taking blood thinners)  Insulin: As soon as you can eat, you may resume your normal dosing schedule. (Only for those taking insulin)  Infection prevention: Keep procedure site clean and dry.  Post-procedure Pain Diary: Extremely important that this be done correctly and accurately. Recorded information will be used to determine the next step in treatment.  Pain evaluated is that of treated area only. Do not include pain from an untreated area.  Complete every hour, on the hour, for the initial 8 hours. Set an alarm to help you do this part accurately.  Do not go to sleep and have it completed later. It will not be accurate.  Follow-up appointment: Keep your follow-up appointment after the procedure. Usually 2 weeks for most procedures. (6 weeks in the case of radiofrequency.) Bring you pain diary.   Expect:  From numbing medicine (AKA: Local Anesthetics): Numbness or decrease in pain.  Onset: Full effect within 15  minutes of injected.  Duration: It will depend on the type of local anesthetic used. On the average, 1 to 8 hours.   From steroids: Decrease in swelling or inflammation. Once inflammation is improved, relief of the pain will follow.  Onset of benefits: Depends on the amount of swelling present. The more swelling, the longer it will take for the benefits to be seen. In some cases, up to 10 days.  Duration: Steroids will stay in the system x 2 weeks. Duration of benefits will depend on multiple posibilities including persistent irritating factors.  From procedure: Some discomfort is to be expected once the numbing medicine wears off. This should be minimal if ice and heat are applied as instructed.  Call if:  You experience numbness and weakness that gets worse with time, as opposed to wearing off.  New onset bowel or bladder incontinence. (This applies to Spinal procedures only)  Emergency Numbers:  Durning business hours (Monday - Thursday, 8:00 AM - 4:00 PM) (Friday, 9:00 AM - 12:00 Noon): [phone]  After hours: [phone] ____________________________________________________________________________________________   ____________________________________________________________________________________________  Preparing for Procedure with Sedation  Instructions: . Oral Intake: Do not eat or drink anything for at least 8 hours prior to your procedure. . Transportation: Public transportation is not allowed. Bring an adult driver. The driver must be physically present in our waiting room before any procedure can be started. Marland Kitchen Physical Assistance: Bring an adult physically capable of assisting you, in the event you need help. This adult should keep you company at home for at least 6 hours after the procedure. . Blood Pressure Medicine: Take your blood pressure medicine with a sip  of water the morning of the procedure. . Blood thinners:  . Diabetics on insulin: Notify the  staff so that you can be scheduled 1st case in the morning. If your diabetes requires high dose insulin, take only  of your normal insulin dose the morning of the procedure and notify the staff that you have done so. . Preventing infections: Shower with an antibacterial soap the morning of your procedure. . Build-up your immune system: Take 1000 mg of Vitamin C with every meal (3 times a day) the day prior to your procedure. . Antibiotics: Inform the staff if you have a condition or reason that requires you to take antibiotics before dental procedures. . Pregnancy: If you are pregnant, call and cancel the procedure. . Sickness: If you have a cold, fever, or any active infections, call and cancel the procedure. . Arrival: You must be in the facility at least 30 minutes prior to your scheduled procedure. . Children: Do not bring children with you. . Dress appropriately: Bring dark clothing that you would not mind if they get stained. . Valuables: Do not bring any jewelry or valuables.  Procedure appointments are reserved for interventional treatments only. . No Prescription Refills. . No medication changes will be discussed during procedure appointments. . No disability issues will be discussed.  Remember:  Regular Business hours are:  Monday to Thursday 8:00 AM to 4:00 PM  Provider's Schedule: Kerwin Augustus, MD:  Procedure days: Tuesday and Thursday 7:30 AM to 4:00 PM  Bilal Lateef, MD:  Procedure days: Monday and Wednesday 7:30 AM to 4:00 PM ____________________________________________________________________________________________    

## 2018-01-19 NOTE — Progress Notes (Signed)
Safety precautions to be maintained throughout the outpatient stay will include: orient to surroundings, keep bed in low position, maintain call bell within reach at all times, provide assistance with transfer out of bed and ambulation.  

## 2018-01-19 NOTE — Progress Notes (Signed)
Patient's Name: Karen Lewis  MRN: [id number]  Referring Provider: Idelle Crouch, MD  DOB: 07/30/1971  PCP: Idelle Crouch, MD  DOS: 01/19/2018  Note by: Gaspar Cola, MD  Service setting: Ambulatory outpatient  Specialty: Interventional Pain Management  Patient type: Established  Location: [facility] (AMB) Pain Management Facility  Visit type: Interventional Procedure   Primary Reason for Visit: Interventional Pain Management Treatment. CC: Knee Pain (bilateral)  Procedure:  Anesthesia, Analgesia, Anxiolysis:  Type: Therapeutic Intra-Articular Hyalgan Knee Injection #3  Region: Lateral infrapatellar Knee Region Level: Knee Joint Laterality: Bilateral  Type: Moderate (Conscious) Sedation combined with Local Anesthesia Indication(s): Analgesia and Anxiety Local Anesthetic: Lidocaine 1-2% Route: Intravenous (IV) IV Access: Secured Sedation: Meaningful verbal contact was maintained at all times during the procedure    Indications: 1. Secondary Osteoarthritis of knee (Bilateral) (R>L)   2. Chronic knee pain (Primary Source of Pain) (Bilateral) (R>L)    Pain Score: Pre-procedure: 3 /10 Post-procedure: 0-No pain/10  Pre-op Assessment:  Karen Lewis is a 47 y.o. (year old), female patient, seen today for interventional treatment. She  has a past surgical history that includes Laparoscopic partial gastrectomy; Shoulder arthroscopy (Right); Joint replacement (Left, hip); Carpal tunnel release (Bilateral); Diagnostic laparoscopy; Cholecystectomy; Trigger finger release (Right); Thyroidectomy (N/A, 11/12/2015); left trigger finger; Roux-en-Y Gastric Bypass (06/03/2017); and Hiatal hernia repair. Karen Lewis has a current medication list which includes the following prescription(s): acetaminophen, acyclovir, albuterol, amitriptyline, accu-chek aviva, accu-chek aviva plus, butalbital-acetaminophen-caffeine, diphenhydramine, estradiol, famotidine, gabapentin, guaifenesin-codeine, ipratropium-albuterol,  ketoconazole, simple diagnostics lancing dev, levocetirizine, levothyroxine, levothyroxine, magnesium oxide, meclizine, medroxyprogesterone, metformin, metoprolol tartrate, montelukast, multivitamin with minerals, naloxone, nitrofurantoin (macrocrystal-monohydrate), omeprazole, oxycodone, pioglitazone, quetiapine, ranitidine, ranitidine, rosuvastatin, sertraline, spironolactone, tizanidine, triamcinolone, triamcinolone, true metrix blood glucose test, ulticare alcohol swabs, vitamin d (ergocalciferol), zonisamide, oxycodone, oxycodone, and phentermine, and the following Facility-Administered Medications: fentanyl, lactated ringers, and midazolam. Her primarily concern today is the Knee Pain (bilateral)  Initial Vital Signs:  Pulse/HCG Rate: 79ECG Heart Rate: 79 Temp: 98.5 F (36.9 C) Resp: 18 BP: (!) 134/92 SpO2: 100 %  BMI: Estimated body mass index is 65.4 kg/m as calculated from the following:   Height as of this encounter: 5\' 4"  (1.626 m).   Weight as of this encounter: 381 lb (172.8 kg).  Risk Assessment: Allergies: Reviewed. She is allergic to bactrim [sulfamethoxazole-trimethoprim]; omalizumab; ciprofloxacin; shellfish allergy; aspirin; clindamycin; motrin [ibuprofen]; nsaids; other; and sulfa antibiotics.  Allergy Precautions: No iodine containing solutions or radiological contrast used. Coagulopathies: Reviewed. None identified.  Blood-thinner therapy: None at this time Active Infection(s): Reviewed. None identified. Karen Lewis is afebrile  Site Confirmation: Karen Lewis was asked to confirm the procedure and laterality before marking the site Procedure checklist: Completed Consent: Before the procedure and under the influence of no sedative(s), amnesic(s), or anxiolytics, the patient was informed of the treatment options, risks and possible complications. To fulfill our ethical and legal obligations, as recommended by the American Medical Association's Code of Ethics, I have informed the  patient of my clinical impression; the nature and purpose of the treatment or procedure; the risks, benefits, and possible complications of the intervention; the alternatives, including doing nothing; the risk(s) and benefit(s) of the alternative treatment(s) or procedure(s); and the risk(s) and benefit(s) of doing nothing. The patient was provided information about the general risks and possible complications associated with the procedure. These may include, but are not limited to: failure to achieve desired goals, infection, bleeding, organ or nerve damage, allergic reactions, paralysis, and death. In addition,  the patient was informed of those risks and complications associated to the procedure, such as failure to decrease pain; infection; bleeding; organ or nerve damage with subsequent damage to sensory, motor, and/or autonomic systems, resulting in permanent pain, numbness, and/or weakness of one or several areas of the body; allergic reactions; (i.e.: anaphylactic reaction); and/or death. Furthermore, the patient was informed of those risks and complications associated with the medications. These include, but are not limited to: allergic reactions (i.e.: anaphylactic or anaphylactoid reaction(s)); adrenal axis suppression; blood sugar elevation that in diabetics may result in ketoacidosis or comma; water retention that in patients with history of congestive heart failure may result in shortness of breath, pulmonary edema, and decompensation with resultant heart failure; weight gain; swelling or edema; medication-induced neural toxicity; particulate matter embolism and blood vessel occlusion with resultant organ, and/or nervous system infarction; and/or aseptic necrosis of one or more joints. Finally, the patient was informed that Medicine is not an exact science; therefore, there is also the possibility of unforeseen or unpredictable risks and/or possible complications that may result in a catastrophic  outcome. The patient indicated having understood very clearly. We have given the patient no guarantees and we have made no promises. Enough time was given to the patient to ask questions, all of which were answered to the patient's satisfaction. Ms. Hisle has indicated that she wanted to continue with the procedure. Attestation: I, the ordering provider, attest that I have discussed with the patient the benefits, risks, side-effects, alternatives, likelihood of achieving goals, and potential problems during recovery for the procedure that I have provided informed consent. Date  Time: 01/19/2018  8:26 AM  Pre-Procedure Preparation:  Monitoring: As per clinic protocol. Respiration, ETCO2, SpO2, BP, heart rate and rhythm monitor placed and checked for adequate function Safety Precautions: Patient was assessed for positional comfort and pressure points before starting the procedure. Time-out: I initiated and conducted the "Time-out" before starting the procedure, as per protocol. The patient was asked to participate by confirming the accuracy of the "Time Out" information. Verification of the correct person, site, and procedure were performed and confirmed by me, the nursing staff, and the patient. "Time-out" conducted as per Joint Commission's Universal Protocol (UP.01.01.01). Time: 0937  Description of Procedure:       Position: Sitting Target Area: Knee Joint Approach: Just above the Lateral tibial plateau, lateral to the infrapatellar tendon. Area Prepped: Entire knee area, from the mid-thigh to the mid-shin. Prepping solution: ChloraPrep (2% chlorhexidine gluconate and 70% isopropyl alcohol) Safety Precautions: Aspiration looking for blood return was conducted prior to all injections. At no point did we inject any substances, as a needle was being advanced. No attempts were made at seeking any paresthesias. Safe injection practices and needle disposal techniques used. Medications properly checked for  expiration dates. SDV (single dose vial) medications used. Description of the Procedure: Protocol guidelines were followed. The patient was placed in position over the fluoroscopy table. The target area was identified and the area prepped in the usual manner. Skin desensitized using vapocoolant spray. Skin & deeper tissues infiltrated with local anesthetic. Appropriate amount of time allowed to pass for local anesthetics to take effect. The procedure needles were then advanced to the target area. Proper needle placement secured. Negative aspiration confirmed. Solution injected in intermittent fashion, asking for systemic symptoms every 0.5cc of injectate. The needles were then removed and the area cleansed, making sure to leave some of the prepping solution back to take advantage of its long term bactericidal  properties. Vitals:   01/19/18 0940 01/19/18 0946 01/19/18 0955 01/19/18 1004  BP: (!) 118/97 (!) 137/109 132/69 121/66  Pulse:      Resp: 16 19 20 20   Temp:      SpO2: 98% 98% 98% 98%  Weight:      Height:        Start Time: 0937 hrs. End Time: 0945 hrs. Materials:  Needle(s) Type: Regular needle Gauge: 22G Length: 3.5-in Medication(s): Please see orders for medications and dosing details.  Imaging Guidance:  Type of Imaging Technique: None used Indication(s): N/A Exposure Time: No patient exposure Contrast: None used. Fluoroscopic Guidance: N/A Ultrasound Guidance: N/A Interpretation: N/A  Antibiotic Prophylaxis:   Anti-infectives (From admission, onward)   None     Indication(s): None identified  Post-operative Assessment:  Post-procedure Vital Signs:  Pulse/HCG Rate: 7981 Temp: 98.5 F (36.9 C) Resp: 20 BP: 121/66 SpO2: 98 %  EBL: None  Complications: No immediate post-treatment complications observed by team, or reported by patient.  Note: The patient tolerated the entire procedure well. A repeat set of vitals were taken after the procedure and the patient  was kept under observation following institutional policy, for this type of procedure. Post-procedural neurological assessment was performed, showing return to baseline, prior to discharge. The patient was provided with post-procedure discharge instructions, including a section on how to identify potential problems. Should any problems arise concerning this procedure, the patient was given instructions to immediately contact us, at any time, without hesitation. In any case, we plan to contact the patient by telephone for a follow-up status report regarding this interventional procedure.  Comments:  No additional relevant information.  Plan of Care    Imaging Orders     DG C-Arm 1-60 Min-No Report  Procedure Orders     KNEE INJECTION     KNEE INJECTION  Medications ordered for procedure: Meds ordered this encounter  Medications  . midazolam (VERSED) 5 MG/5ML injection 1-2 mg    Make sure Flumazenil is available in the pyxis when using this medication. If oversedation occurs, administer 0.2 mg IV over 15 sec. If after 45 sec no response, administer 0.2 mg again over 1 min; may repeat at 1 min intervals; not to exceed 4 doses (1 mg)  . fentaNYL (SUBLIMAZE) injection 25-50 mcg    Make sure Narcan is available in the pyxis when using this medication. In the event of respiratory depression (RR< 8/min): Titrate NARCAN (naloxone) in increments of 0.1 to 0.2 mg IV at 2-3 minute intervals, until desired degree of reversal.  . lactated ringers infusion 1,000 mL  . lidocaine (PF) (XYLOCAINE) 1 % injection 4 mL  . ropivacaine (PF) 2 mg/mL (0.2%) (NAROPIN) injection 4 mL  . Sodium Hyaluronate SOSY 2 mL  . Sodium Hyaluronate SOSY 2 mL   Medications administered: We administered midazolam, fentaNYL, lactated ringers, lidocaine (PF), ropivacaine (PF) 2 mg/mL (0.2%), Sodium Hyaluronate, and Sodium Hyaluronate.  See the medical record for exact dosing, route, and time of administration.  New  Prescriptions   No medications on file   Disposition: Discharge home  Discharge Date & Time: 01/19/2018; 0920 hrs.   Physician-requested Follow-up: Return in about 2 weeks (around 02/02/2018) for Procedure (w/ sedation): (B) IA Hyalgan #4.  Future Appointments  Date Time Provider [place]  01/30/2018  1:30 PM Vevelyn Francois, NP [facility] None  02/02/2018  9:45 AM Milinda Pointer, MD [facility] None   Primary Care Physician: Idelle Crouch, MD Location: [facility] Out[facility]  Management [facility] Note by: Gaspar Cola, MD Date: 01/19/2018; Time: 10:07 AM  Disclaimer:  Medicine is not an Chief Strategy Officer. The only guarantee in medicine is that nothing is guaranteed. It is important to note that the decision to proceed with this intervention was based on the information collected from the patient. The Data and conclusions were drawn from the patient's questionnaire, the interview, and the physical examination. Because the information was provided in large part by the patient, it cannot be guaranteed that it has not been purposely or unconsciously manipulated. Every effort has been made to obtain as much relevant data as possible for this evaluation. It is important to note that the conclusions that lead to this procedure are derived in large part from the available data. Always take into account that the treatment will also be dependent on availability of resources and existing treatment guidelines, considered by other Pain Management Practitioners as being common knowledge and practice, at the time of the intervention. For Medico-Legal purposes, it is also important to point out that variation in procedural techniques and pharmacological choices are the acceptable norm. The indications, contraindications, technique, and results of the above procedure should only be interpreted and judged by a Board-Certified Interventional Pain Specialist with extensive familiarity and expertise in the same  exact procedure and technique.

## 2018-01-20 ENCOUNTER — Telehealth: Payer: Self-pay

## 2018-01-20 NOTE — Telephone Encounter (Signed)
Post Procedure phone call.  Patient states she is doing well.

## 2018-01-30 ENCOUNTER — Encounter: Payer: Self-pay | Admitting: Nurse Practitioner

## 2018-01-30 ENCOUNTER — Ambulatory Visit: Payer: Medicare HMO | Attending: Nurse Practitioner | Admitting: Nurse Practitioner

## 2018-01-30 ENCOUNTER — Other Ambulatory Visit: Payer: Self-pay

## 2018-01-30 VITALS — BP 95/48 | HR 75 | Temp 98.5°F | Ht 64.0 in | Wt 380.0 lb

## 2018-01-30 DIAGNOSIS — E785 Hyperlipidemia, unspecified: Secondary | ICD-10-CM | POA: Diagnosis not present

## 2018-01-30 DIAGNOSIS — I249 Acute ischemic heart disease, unspecified: Secondary | ICD-10-CM | POA: Insufficient documentation

## 2018-01-30 DIAGNOSIS — J45909 Unspecified asthma, uncomplicated: Secondary | ICD-10-CM | POA: Insufficient documentation

## 2018-01-30 DIAGNOSIS — M109 Gout, unspecified: Secondary | ICD-10-CM | POA: Insufficient documentation

## 2018-01-30 DIAGNOSIS — M47816 Spondylosis without myelopathy or radiculopathy, lumbar region: Secondary | ICD-10-CM | POA: Diagnosis not present

## 2018-01-30 DIAGNOSIS — G8929 Other chronic pain: Secondary | ICD-10-CM

## 2018-01-30 DIAGNOSIS — M79602 Pain in left arm: Secondary | ICD-10-CM | POA: Diagnosis not present

## 2018-01-30 DIAGNOSIS — M797 Fibromyalgia: Secondary | ICD-10-CM | POA: Insufficient documentation

## 2018-01-30 DIAGNOSIS — G709 Myoneural disorder, unspecified: Secondary | ICD-10-CM | POA: Diagnosis not present

## 2018-01-30 DIAGNOSIS — K219 Gastro-esophageal reflux disease without esophagitis: Secondary | ICD-10-CM | POA: Insufficient documentation

## 2018-01-30 DIAGNOSIS — M1611 Unilateral primary osteoarthritis, right hip: Secondary | ICD-10-CM | POA: Diagnosis not present

## 2018-01-30 DIAGNOSIS — M170 Bilateral primary osteoarthritis of knee: Secondary | ICD-10-CM | POA: Diagnosis not present

## 2018-01-30 DIAGNOSIS — G894 Chronic pain syndrome: Secondary | ICD-10-CM | POA: Insufficient documentation

## 2018-01-30 DIAGNOSIS — M19012 Primary osteoarthritis, left shoulder: Secondary | ICD-10-CM | POA: Insufficient documentation

## 2018-01-30 DIAGNOSIS — M792 Neuralgia and neuritis, unspecified: Secondary | ICD-10-CM

## 2018-01-30 DIAGNOSIS — E890 Postprocedural hypothyroidism: Secondary | ICD-10-CM | POA: Insufficient documentation

## 2018-01-30 DIAGNOSIS — M25512 Pain in left shoulder: Secondary | ICD-10-CM | POA: Insufficient documentation

## 2018-01-30 DIAGNOSIS — E042 Nontoxic multinodular goiter: Secondary | ICD-10-CM | POA: Insufficient documentation

## 2018-01-30 DIAGNOSIS — Z79899 Other long term (current) drug therapy: Secondary | ICD-10-CM | POA: Insufficient documentation

## 2018-01-30 DIAGNOSIS — F411 Generalized anxiety disorder: Secondary | ICD-10-CM | POA: Insufficient documentation

## 2018-01-30 DIAGNOSIS — M899 Disorder of bone, unspecified: Secondary | ICD-10-CM

## 2018-01-30 DIAGNOSIS — E119 Type 2 diabetes mellitus without complications: Secondary | ICD-10-CM | POA: Diagnosis not present

## 2018-01-30 DIAGNOSIS — E282 Polycystic ovarian syndrome: Secondary | ICD-10-CM | POA: Insufficient documentation

## 2018-01-30 DIAGNOSIS — M25562 Pain in left knee: Secondary | ICD-10-CM | POA: Diagnosis not present

## 2018-01-30 DIAGNOSIS — Z79891 Long term (current) use of opiate analgesic: Secondary | ICD-10-CM | POA: Insufficient documentation

## 2018-01-30 DIAGNOSIS — M545 Low back pain: Secondary | ICD-10-CM | POA: Diagnosis not present

## 2018-01-30 DIAGNOSIS — M25561 Pain in right knee: Secondary | ICD-10-CM | POA: Insufficient documentation

## 2018-01-30 DIAGNOSIS — Z8614 Personal history of Methicillin resistant Staphylococcus aureus infection: Secondary | ICD-10-CM | POA: Diagnosis not present

## 2018-01-30 DIAGNOSIS — F331 Major depressive disorder, recurrent, moderate: Secondary | ICD-10-CM | POA: Insufficient documentation

## 2018-01-30 MED ORDER — MAGNESIUM OXIDE 400 MG PO TABS: 400.0000 mg | ORAL_TABLET | Freq: Two times a day (BID) | ORAL | 2 refills | Status: DC

## 2018-01-30 MED ORDER — GABAPENTIN 800 MG PO TABS: 800.0000 mg | ORAL_TABLET | Freq: Four times a day (QID) | ORAL | 0 refills | Status: DC

## 2018-01-30 MED ORDER — OXYCODONE HCL 5 MG PO TABS: 5.0000 mg | ORAL_TABLET | Freq: Every day | ORAL | 0 refills | Status: DC | PRN

## 2018-01-30 NOTE — Progress Notes (Signed)
Patient's Name: Karen Lewis  MRN: [id number]  Referring Provider: Idelle Crouch, MD  DOB: [birth year]  PCP: Idelle Crouch, MD  DOS: 01/30/2018  Note by: Vevelyn Francois NP  Service setting: Ambulatory outpatient  Specialty: Interventional Pain Management  Location: [facility] (AMB) Pain Management Facility    Patient type: Established    Primary Reason(s) for Visit: Encounter for prescription drug management. (Level of risk: moderate)  CC: Back Pain  HPI  Karen Lewis is a 47 y.o. year old, female patient, who comes today for a medication management evaluation. She has History of asthma; Binge eating disorder; Fibromyalgia; Anxiety, generalized; Insomnia, persistent; Depression, major, recurrent, moderate ([place]); Uncomplicated asthma; Eczema; Gout; Hyperlipidemia; Headache, migraine; Obstructive apnea; Bilateral polycystic ovarian syndrome; Apnea, sleep; Disease of thyroid gland; Avitaminosis D; History of surgical procedure; Type 2 diabetes mellitus without complication, without long-term current use of insulin ([place]); Long term current use of opiate analgesic; Long term prescription opiate use; Opiate use; Encounter for therapeutic drug level monitoring; Opiate dependence ([place]); Hypertension associated with diabetes ([place]); Goiter, nontoxic, multinodular; Chronic knee pain (Primary Source of Pain) (Bilateral) (R>L); Chronic low back pain [facility] source of pain) (Bilateral) (R>L); Lumbar facet syndrome (Bilateral) (R>L); Osteoarthritis; Grade 1 (1.4 cm) Anterolisthesis of L4 over L5; Chronic hip pain (Secondary source of pain) (Right); S/P THR (total hip replacement) (Left); History of methicillin resistant staphylococcus aureus (MRSA); History of bariatric surgery; Hypomagnesemia; Morbid obesity ([place]); History of methicillin resistant Staphylococcus aureus infection; Lumbar spondylosis; Vitamin D insufficiency; Dysphagia, unspecified; Gastroesophageal reflux disease without esophagitis; Chest pain with low  risk of acute coronary syndrome; Nontoxic goiter; Other psychoactive substance dependence, uncomplicated (HCC); SOB (shortness of breath) on exertion; Chronic pain syndrome; Neurogenic pain; Upper extremity pain; Chronic shoulder pain (Left); Osteoarthritis of shoulder (Left); Controlled drug dependence ([place]); Osteoarthritis of hip (Right); Hyperlipidemia due to type 2 diabetes mellitus ([place]); Post-operative hypothyroidism; Secondary Osteoarthritis of knee (Bilateral) (R>L); Major depressive disorder, single episode; Depression; Dermatitis; and Asthma on their problem list. Her primarily concern today is the Back Pain  Pain Assessment: Location: Lower Back Radiating: Pain radiates down to knee Onset: More than a month ago Duration: Chronic pain Quality: Aching, Dull, Sharp Severity: 3 /10 (subjective, self-reported pain score)  Note: Reported level is compatible with observation.                          Timing: Constant Modifying factors: repositioning, meds BP: (!) 95/48  HR: 75  Karen Lewis was last scheduled for an appointment on 08/30/2017 for medication management. During today's appointment we reviewed Karen Lewis's chronic pain status, as well as her outpatient medication regimen. She admits that she is having more pain in the left lower back. She feels like this is new. She admits that it goes into her buttock and hip. She is aware that the pain is probably related to her alignment with her right hip being off. She notices it with sitting for long periods. She admits that her shoulder pain has returned. She is SP left shoulder steriod injection which was effective for greater than 6 months. She continues to loose weight and plans on having right hip surgery in the future. She amdits that may be after she has removal of her abdominal fold. She admits that she is getting good relief with her Hyalgan injections.   The patient  reports that she does not use drugs. Her body mass index is 65.23  kg/m.  Further  details on both, my assessment(s), as well as the proposed treatment plan, please see below.  Controlled Substance Pharmacotherapy Assessment REMS (Risk Evaluation and Mitigation Strategy)  Analgesic:Oxycodone/APAP 5/325 one every 6 hours when necessary for pain (20 mg/day) MME/day:30 mg/day.    Karen Fischer, RN  01/30/2018  2:33 PM  Sign at close encounter Nursing Pain Medication Assessment:  Safety precautions to be maintained throughout the outpatient stay will include: orient to surroundings, keep bed in low position, maintain call bell within reach at all times, provide assistance with transfer out of bed and ambulation.  Medication Inspection Compliance: Pill count conducted under aseptic conditions, in front of the patient. Neither the pills nor the bottle was removed from the patient's sight at any time. Once count was completed pills were immediately returned to the patient in their original bottle.  Medication: Oxycodone IR Pill/Patch Count: 18 of 150 pills remain Pill/Patch Appearance: Markings consistent with prescribed medication Bottle Appearance: Standard pharmacy container. Clearly labeled. Filled Date: 03 /13 / 2019 Last Medication intake:  Today   Pharmacokinetics: Liberation and absorption (onset of action): WNL Distribution (time to peak effect): WNL Metabolism and excretion (duration of action): WNL         Pharmacodynamics: Desired effects: Analgesia: Karen Lewis reports >50% benefit. Functional ability: Patient reports that medication allows her to accomplish basic ADLs Clinically meaningful improvement in function (CMIF): Sustained CMIF goals met Perceived effectiveness: Described as relatively effective, allowing for increase in activities of daily living (ADL) Undesirable effects: Side-effects or Adverse reactions: None reported Monitoring: [place] PMP: Online review of the past 32-monthperiod conducted. Compliant with practice rules and  regulations Last UDS on record: Summary  Date Value Ref Range Status  02/17/2017 FINAL  Final    Comment:    ==================================================================== TOXASSURE SELECT 13 (MW) ==================================================================== Test                             Result       Flag       Units Drug Present and Declared for Prescription Verification   Oxycodone                      135          EXPECTED   ng/mg creat   Noroxycodone                   1816         EXPECTED   ng/mg creat    Sources of oxycodone include scheduled prescription medications.    Noroxycodone is an expected metabolite of oxycodone. Drug Absent but Declared for Prescription Verification   Codeine                        Not Detected UNEXPECTED ng/mg creat   Butalbital                     Not Detected UNEXPECTED ==================================================================== Test                      Result    Flag   Units      Ref Range   Creatinine              167              mg/dL      >=20 ==================================================================== Declared Medications:  The flagging and interpretation on this report are based on the  following declared medications.  Unexpected results may arise from  inaccuracies in the declared medications.  **Note: The testing scope of this panel includes these medications:  Butalbital (Butalbital/APAP/Caffeine)  Codeine  Oxycodone  **Note: The testing scope of this panel does not include following  reported medications:  Acetaminophen  Acetaminophen (Butalbital/APAP/Caffeine)  Acyclovir  Albuterol  Albuterol (Ipratropium-Albuterol)  Amitriptyline  Azelastine  Caffeine (Butalbital/APAP/Caffeine)  Cephalexin  Cyanocobalamin  Gabapentin  Glipizide  Guaifenesin  Ipratropium (Ipratropium-Albuterol)  Ketoconazole  Levocetirizine  Levothyroxine  Magnesium Oxide  Meclizine  Medroxyprogesterone  Metformin   Metoprolol  Montelukast  Multivitamin  Omeprazole  Phentermine  Quetiapine  Ranitidine  Rosuvastatin  Sertraline  Spironolactone  Sucralfate  Tizanidine  Triamcinolone acetonide  Vitamin D2 (Ergocalciferol)  Zonisamide ==================================================================== For clinical consultation, please call [phone]. ====================================================================    UDS interpretation: Compliant          Medication Assessment Form: Reviewed. Patient indicates being compliant with therapy Treatment compliance: Compliant Risk Assessment Profile: Aberrant behavior: See prior evaluations. None observed or detected today Comorbid factors increasing risk of overdose: See prior notes. No additional risks detected today Risk of substance use disorder (SUD): Low Opioid Risk Tool - 01/19/18 0837      Family History of Substance Abuse   Alcohol  Positive Female    Illegal Drugs  Positive Female    Rx Drugs  Negative      Personal History of Substance Abuse   Alcohol  Negative    Illegal Drugs  Negative    Rx Drugs  Negative      Age   Age between 47-45 years   No      Psychological Disease   Psychological Disease  Negative    Depression  Positive      Total Score   Opioid Risk Tool Scoring  4    Opioid Risk Interpretation  Moderate Risk      ORT Scoring interpretation table:  Score <3 = Low Risk for SUD  Score between 4-7 = Moderate Risk for SUD  Score >8 = High Risk for Opioid Abuse   Risk Mitigation Strategies:  Patient Counseling: Covered Patient-Prescriber Agreement (PPA): Present and active  Notification to other healthcare providers: Done  Pharmacologic Plan: No change in therapy, at this time.              Post-Procedure Assessment  01/19/2018 Procedure: Hyalgan knee injections X3 Pre-procedure pain score:  3/10 Post-procedure pain score: 0/10         Influential Factors: BMI: 65.23 kg/m Intra-procedural  challenges: None observed.         Assessment challenges: None detected.              Reported side-effects: None.        Post-procedural adverse reactions or complications: None reported         Sedation: Please see nurses note. When no sedatives are used, the analgesic levels obtained are directly associated to the effectiveness of the local anesthetics. However, when sedation is provided, the level of analgesia obtained during the initial 1 hour following the intervention, is believed to be the result of a combination of factors. These factors may include, but are not limited to: 1. The effectiveness of the local anesthetics used. 2. The effects of the analgesic(s) and/or anxiolytic(s) used. 3. The degree of discomfort experienced by the patient at the time of the procedure. 4. The patients ability  and reliability in recalling and recording the events. 5. The presence and influence of possible secondary gains and/or psychosocial factors. Reported result: Relief experienced during the 1st hour after the procedure: 100 % (Ultra-Short Term Relief)            Interpretative annotation: Clinically appropriate result. Analgesia during this period is likely to be Local Anesthetic and/or IV Sedative (Analgesic/Anxiolytic) related.          Effects of local anesthetic: The analgesic effects attained during this period are directly associated to the localized infiltration of local anesthetics and therefore cary significant diagnostic value as to the etiological location, or anatomical origin, of the pain. Expected duration of relief is directly dependent on the pharmacodynamics of the local anesthetic used. Long-acting (4-6 hours) anesthetics used.  Reported result: Relief during the next 4 to 6 hour after the procedure: 50 % (Short-Term Relief)            Interpretative annotation: Clinically appropriate result. Analgesia during this period is likely to be Local Anesthetic-related.          Long-term  benefit: Defined as the period of time past the expected duration of local anesthetics (1 hour for short-acting and 4-6 hours for long-acting). With the possible exception of prolonged sympathetic blockade from the local anesthetics, benefits during this period are typically attributed to, or associated with, other factors such as analgesic sensory neuropraxia, antiinflammatory effects, or beneficial biochemical changes provided by agents other than the local anesthetics.  Reported result: Extended relief following procedure: 80 % (Long-Term Relief)            Interpretative annotation: Clinically appropriate result. Good relief. No permanent benefit expected. Inflammation plays a part in the etiology to the pain.          Current benefits: Defined as reported results that persistent at this point in time.   Analgesia: <75 %            Function: Ms. Jobst reports improvement in function ROM: Ms. Wiswell reports improvement in ROM Interpretative annotation: Ongoing benefit.                Interpretation: Results would suggest this therapy to be effective in the management of Ms. Ramus's condition.                  Plan:  Please see "Plan of Care" for details.                Laboratory Chemistry  Inflammation Markers (CRP: Acute Phase) (ESR: Chronic Phase) Lab Results  Component Value Date   CRP <0.5 11/24/2015   ESRSEDRATE 20 01/30/2018                         Rheumatology Markers No results found for: Elayne Guerin, Scnetx                      Renal Function Markers Lab Results  Component Value Date   BUN 8 11/24/2015   CREATININE 0.80 05/11/2016   GFRAA >60 11/24/2015   GFRNONAA >60 11/24/2015                              Hepatic Function Markers Lab Results  Component Value Date   AST 23 11/24/2015   ALT 20 11/24/2015   ALBUMIN 3.7 11/24/2015   ALKPHOS 81 11/24/2015  Electrolytes Lab Results  Component Value Date   NA 139  11/24/2015   K 4.3 11/24/2015   CL 105 11/24/2015   CALCIUM 8.6 (L) 11/24/2015   MG 1.6 01/30/2018                        Neuropathy Markers Lab Results  Component Value Date   VITAMINB12 303 11/24/2015                        Bone Pathology Markers Lab Results  Component Value Date   VD25OH 27.4 (L) 11/24/2015   VD125OH2TOT 41.5 11/24/2015                         Coagulation Parameters Lab Results  Component Value Date   INR 0.95 05/07/2015   LABPROT 12.9 05/07/2015   APTT 26 05/07/2015   PLT 337 11/12/2015                        Cardiovascular Markers Lab Results  Component Value Date   HGB 12.4 11/12/2015   HCT 38.0 11/12/2015                         CA Markers No results found for: CEA, CA125, LABCA2                      Note: Lab results reviewed.  Recent Diagnostic Imaging Results  DG C-Arm 1-60 Min-No Report Fluoroscopy was utilized by the requesting physician.  No radiographic  interpretation.   Complexity Note: I personally reviewed the fluoroscopic imaging of the procedure.                        Meds   Current Outpatient Medications:  .  Acetaminophen 500 MG coapsule, Take 500 mg by mouth every 6 (six) hours as needed. , Disp: , Rfl:  .  acyclovir (ZOVIRAX) 800 MG tablet, Take 800 mg by mouth 3 times/day as needed-between meals & bedtime. Reported on 11/12/2015, Disp: , Rfl:  .  albuterol (PROVENTIL HFA;VENTOLIN HFA) 108 (90 BASE) MCG/ACT inhaler, Inhale 2 puffs into the lungs every 6 (six) hours as needed for wheezing or shortness of breath., Disp: , Rfl:  .  amitriptyline (ELAVIL) 100 MG tablet, , Disp: , Rfl: 3 .  Blood Glucose Calibration (ACCU-CHEK AVIVA) SOLN, , Disp: , Rfl:  .  Blood Glucose Monitoring Suppl (ACCU-CHEK AVIVA PLUS) w/Device KIT, , Disp: , Rfl:  .  butalbital-acetaminophen-caffeine (FIORICET, ESGIC) 50-325-40 MG tablet, 1 tablet every 6 (six) hours as needed. , Disp: , Rfl:  .  diphenhydrAMINE (BENADRYL) 25 mg capsule, Take by  mouth., Disp: , Rfl:  .  estradiol (ESTRACE) 0.1 MG/GM vaginal cream, , Disp: , Rfl:  .  famotidine (PEPCID) 20 MG tablet, Take by mouth., Disp: , Rfl:  .  guaiFENesin-codeine 100-10 MG/5ML syrup, Take by mouth., Disp: , Rfl:  .  ipratropium-albuterol (DUONEB) 0.5-2.5 (3) MG/3ML SOLN, 3 mLs as needed. , Disp: , Rfl:  .  ketoconazole (NIZORAL) 2 % cream, APPLY AS DIRECTED TO AFFECTED AREA TWICE DAILY AS NEEDED, Disp: , Rfl:  .  Lancet Devices (SIMPLE DIAGNOSTICS LANCING DEV) MISC, Use 1 each as directed. Accu Chek Softclix. DX: E11.8, Disp: , Rfl:  .  levocetirizine (XYZAL) 5 MG tablet, Take 5 mg by  mouth every evening. , Disp: , Rfl:  .  levothyroxine (SYNTHROID, LEVOTHROID) 200 MCG tablet, Take on an empty stomach each morning before breakfast. Total dose 250 mcg daily., Disp: , Rfl:  .  meclizine (ANTIVERT) 25 MG tablet, Take 1 tablet (25 mg total) by mouth 3 (three) times daily as needed for dizziness., Disp: 30 tablet, Rfl: 0 .  medroxyPROGESTERone (DEPO-PROVERA) 150 MG/ML injection, Inject 1 mL (150 mg total) into the muscle every 3 (three) months., Disp: 1 mL, Rfl: 3 .  metFORMIN (GLUCOPHAGE) 1000 MG tablet, Take 1,000 mg by mouth 2 (two) times daily with a meal. , Disp: , Rfl:  .  metoprolol (LOPRESSOR) 50 MG tablet, Take 75 mg by mouth 2 (two) times daily. , Disp: , Rfl:  .  montelukast (SINGULAIR) 10 MG tablet, Take 10 mg by mouth at bedtime. , Disp: , Rfl:  .  Multiple Vitamin (MULTIVITAMIN WITH MINERALS) TABS tablet, Take 2 tablets by mouth daily. Reported on 11/12/2015, Disp: , Rfl:  .  naloxone (NARCAN) nasal spray 4 mg/0.1 mL, Spray into one nostril. Repeat with second device into other nostril after 2-3 minutes if no or minimal response., Disp: 1 kit, Rfl: 0 .  nitrofurantoin, macrocrystal-monohydrate, (MACROBID) 100 MG capsule, , Disp: , Rfl:  .  omeprazole (PRILOSEC) 40 MG capsule, Take 40 mg by mouth 2 (two) times daily. , Disp: , Rfl:  .  [START ON 03/02/2018] oxyCODONE (OXY  IR/ROXICODONE) 5 MG immediate release tablet, Take 1 tablet (5 mg total) by mouth 5 (five) times daily as needed for severe pain., Disp: 150 tablet, Rfl: 0 .  pioglitazone (ACTOS) 15 MG tablet, , Disp: , Rfl:  .  QUEtiapine (SEROQUEL) 100 MG tablet, Take 1 tablet (100 mg total) by mouth at bedtime., Disp: 90 tablet, Rfl: 0 .  ranitidine (ZANTAC) 150 MG capsule, Take by mouth., Disp: , Rfl:  .  rosuvastatin (CRESTOR) 40 MG tablet, Take 40 mg by mouth daily. , Disp: , Rfl:  .  sertraline (ZOLOFT) 100 MG tablet, Take 2 tablets (200 mg total) by mouth daily., Disp: 180 tablet, Rfl: 2 .  spironolactone (ALDACTONE) 25 MG tablet, Take 37.5 mg by mouth 2 (two) times daily. , Disp: , Rfl:  .  tiZANidine (ZANAFLEX) 4 MG tablet, 4 mg at bedtime. , Disp: , Rfl:  .  triamcinolone (KENALOG) 0.1 % paste, Apply 1 application topically 2 (two) times daily. , Disp: , Rfl:  .  triamcinolone (NASACORT AQ) 55 MCG/ACT AERO nasal inhaler, 2 sprays by Nasal route 2 (two) times daily., Disp: , Rfl:  .  TRUE METRIX BLOOD GLUCOSE TEST test strip, , Disp: , Rfl:  .  ULTICARE ALCOHOL SWABS PADS, Apply 1 each topically 2 (two) times daily. Use when checking BG. DX: E11.8, Disp: , Rfl:  .  zonisamide (ZONEGRAN) 50 MG capsule, Take 150 mg by mouth daily. 3 tabs at bedtime, Disp: , Rfl:  .  gabapentin (NEURONTIN) 800 MG tablet, Take 1 tablet (800 mg total) by mouth every 6 (six) hours., Disp: 360 tablet, Rfl: 0 .  levothyroxine (SYNTHROID, LEVOTHROID) 50 MCG tablet, Take 50 mcg by mouth daily before breakfast. , Disp: , Rfl:  .  magnesium oxide (MAG-OX) 400 MG tablet, Take 1 tablet (400 mg total) by mouth 2 (two) times daily., Disp: 60 tablet, Rfl: 2 .  oxyCODONE (OXY IR/ROXICODONE) 5 MG immediate release tablet, Take 1 tablet (5 mg total) by mouth 5 (five) times daily as needed for severe pain., Disp:  150 tablet, Rfl: 0 .  oxyCODONE (OXY IR/ROXICODONE) 5 MG immediate release tablet, Take 1 tablet (5 mg total) by mouth 5 (five)  times daily as needed for severe pain., Disp: 150 tablet, Rfl: 0 .  phentermine 15 MG capsule, Take 37.5 mg by mouth. , Disp: , Rfl:  .  ranitidine (ZANTAC) 150 MG capsule, Take 150 mg by mouth every evening. , Disp: , Rfl:   ROS  Constitutional: Denies any fever or chills Gastrointestinal: No reported hemesis, hematochezia, vomiting, or acute GI distress Musculoskeletal: Denies any acute onset joint swelling, redness, loss of ROM, or weakness Neurological: No reported episodes of acute onset apraxia, aphasia, dysarthria, agnosia, amnesia, paralysis, loss of coordination, or loss of consciousness  Allergies  Ms. Halvorsen is allergic to bactrim [sulfamethoxazole-trimethoprim]; omalizumab; ciprofloxacin; shellfish allergy; aspirin; clindamycin; motrin [ibuprofen]; nsaids; other; and sulfa antibiotics.  [place]  Drug: Ms. Sargent  reports that she does not use drugs. Alcohol:  reports that she does not drink alcohol. Tobacco:  reports that she has never smoked. She has never used smokeless tobacco. Medical:  has a past medical history of Anemia, Anginal pain ([place]), Anxiety, Arthralgia of hip (07/29/2015), Arthritis, Arthritis, degenerative (07/29/2015), Asthma, Cephalalgia (07/25/2014), Dependence on unknown drug ([place]), Depression, Diabetes mellitus without complication ([place]), Dysrhythmia, Eczema, Fibromyalgia, Gastritis, GERD (gastroesophageal reflux disease), Gonalgia (07/29/2015), Gout, H/O cardiovascular disorder (03/10/2015), H/O surgical procedure (12/05/2012), H/O thyroid disease (03/10/2015), Headache, Herpes, History of artificial joint (07/29/2015), History of hiatal hernia, Hypertension, Hypomagnesemia, Hypothyroidism, LBP (low back pain) (07/29/2015), Neuromuscular disorder ([place]), Obesity, PCOS (polycystic ovarian syndrome), Primary osteoarthritis of both knees (07/29/2015), Sleep apnea, and Thyroid nodule (bilateral). Surgical: Ms. Schwieger  has a past surgical history that includes Laparoscopic partial  gastrectomy; Shoulder arthroscopy (Right); Joint replacement (Left, hip); Carpal tunnel release (Bilateral); Diagnostic laparoscopy; Cholecystectomy; Trigger finger release (Right); Thyroidectomy (N/A, 11/12/2015); left trigger finger; Roux-en-Y Gastric Bypass (06/03/2017); and Hiatal hernia repair. Family: family history includes Alcohol abuse in her father and mother; Anxiety disorder in her father and mother; Breast cancer in her paternal aunt; COPD in her father; Depression in her brother, father, and mother; Diabetes in her brother, father, and mother; Hypertension in her brother, father, and mother; Kidney cancer in her mother; Kidney failure in her father; Post-traumatic stress disorder in her father; Sleep apnea in her brother, father, and mother.  Constitutional Exam  General appearance: Well nourished, well developed, and well hydrated. In no apparent acute distress Vitals:   01/30/18 1400  BP: (!) 95/48  Pulse: 75  Temp: 98.5 F (36.9 C)  SpO2: 100%  Weight: (!) 380 lb (172.4 kg)  Height: 5' 4" (1.626 m)  Psych/Mental status: Alert, oriented x 3 (person, place, & time)       Eyes: PERLA Respiratory: No evidence of acute respiratory distress  Upper Extremity (UE) Exam    Side: Right upper extremity  Side: Left upper extremity  Skin & Extremity Inspection: Skin color, temperature, and hair growth are WNL. No peripheral edema or cyanosis. No masses, redness, swelling, asymmetry, or associated skin lesions. No contractures.  Skin & Extremity Inspection: Skin color, temperature, and hair growth are WNL. No peripheral edema or cyanosis. No masses, redness, swelling, asymmetry, or associated skin lesions. No contractures.  Functional ROM: Unrestricted ROM          Functional ROM: Unrestricted ROM          Muscle Tone/Strength: Functionally intact. No obvious neuro-muscular anomalies detected.  Muscle Tone/Strength: Functionally intact. No obvious neuro-muscular anomalies detected.  Sensory  (Neurological): Unimpaired          Sensory (Neurological): Unimpaired          Palpation: No palpable anomalies              Palpation: Tender              Provocative Test(s):  Phalen's test: deferred Tinel's test: deferred Apley scratch test (Rotator Cuff ROM): deferred  Provocative Test(s):  Phalen's test: deferred Tinel's test: deferred Apley scratch test (Rotator Cuff ROM): deferred   Thoracic Spine Area Exam  Skin & Axial Inspection: No masses, redness, or swelling Alignment: Symmetrical Functional ROM: Unrestricted ROM Stability: No instability detected Muscle Tone/Strength: Functionally intact. No obvious neuro-muscular anomalies detected. Sensory (Neurological): Unimpaired Muscle strength & Tone: No palpable anomalies  Lumbar Spine Area Exam  Skin & Axial Inspection: No masses, redness, or swelling Alignment: Symmetrical Functional ROM: Unrestricted ROM       Stability: No instability detected Muscle Tone/Strength: Functionally intact. No obvious neuro-muscular anomalies detected. Sensory (Neurological): Unimpaired Palpation: Tender       Provocative Tests: Lumbar Hyperextension/rotation test: deferred today       Lumbar quadrant test (Kemp's test): deferred today       Lumbar Lateral bending test: deferred today       Patrick's Maneuver: deferred today                   FABER test: deferred today       Thigh-thrust test: deferred today       S-I compression test: deferred today       S-I distraction test: deferred today        Gait & Posture Assessment  Ambulation: Unassisted Gait: Relatively normal for age and body habitus Posture: WNL   Lower Extremity Exam    Side: Right lower extremity  Side: Left lower extremity  Stability: No instability observed          Stability: No instability observed          Skin & Extremity Inspection: Skin color, temperature, and hair growth are WNL. No peripheral edema or cyanosis. No masses, redness, swelling, asymmetry, or  associated skin lesions. No contractures.  Skin & Extremity Inspection: Skin color, temperature, and hair growth are WNL. No peripheral edema or cyanosis. No masses, redness, swelling, asymmetry, or associated skin lesions. No contractures.  Functional ROM: Unrestricted ROM                  Functional ROM: Unrestricted ROM                  Muscle Tone/Strength: Functionally intact. No obvious neuro-muscular anomalies detected.  Muscle Tone/Strength: Functionally intact. No obvious neuro-muscular anomalies detected.  Sensory (Neurological): Unimpaired  Sensory (Neurological): Unimpaired  Palpation: No palpable anomalies  Palpation: No palpable anomalies   Assessment  Primary Diagnosis & Pertinent Problem List: The primary encounter diagnosis was Lumbar spondylosis. Diagnoses of Chronic knee pain (Primary Source of Pain) (Bilateral) (R>L), Chronic shoulder pain (Left), Fibromyalgia, Neurogenic pain, Hypomagnesemia, Chronic pain syndrome, Disorder of skeletal system, and Long term prescription opiate use were also pertinent to this visit.  Status Diagnosis  Worsening Stable Worsening 1. Lumbar spondylosis   2. Chronic knee pain (Primary Source of Pain) (Bilateral) (R>L)   3. Chronic shoulder pain (Left)   4. Fibromyalgia   5. Neurogenic pain   6. Hypomagnesemia   7. Chronic pain syndrome   8. Disorder of skeletal system  9. Long term prescription opiate use     Problems updated and reviewed during this visit: No problems updated. Plan of Care  Pharmacotherapy (Medications Ordered): Meds ordered this encounter  Medications  . gabapentin (NEURONTIN) 800 MG tablet    Sig: Take 1 tablet (800 mg total) by mouth every 6 (six) hours.    Dispense:  360 tablet    Refill:  0    Do not place this medication, or any other prescription from our practice, on "Automatic Refill". Patient may have prescription filled one day early if pharmacy is closed on scheduled refill date.    Order Specific  Question:   Supervising Provider    Answer:   Milinda Pointer [phone]  . magnesium oxide (MAG-OX) 400 MG tablet    Sig: Take 1 tablet (400 mg total) by mouth 2 (two) times daily.    Dispense:  60 tablet    Refill:  2    Do not place this medication, or any other prescription from our practice, on "Automatic Refill". Patient may have prescription filled one day early if pharmacy is closed on scheduled refill date.    Order Specific Question:   Supervising Provider    Answer:   Milinda Pointer [phone]  . oxyCODONE (OXY IR/ROXICODONE) 5 MG immediate release tablet    Sig: Take 1 tablet (5 mg total) by mouth 5 (five) times daily as needed for severe pain.    Dispense:  150 tablet    Refill:  0    Fill one day early if pharmacy is closed on scheduled refill date.  Do not fill until: 03/02/2018 To last until: 04/01/2018    Order Specific Question:   Supervising Provider    Answer:   Milinda Pointer [phone]   Medications administered today: Bertram Gala. Brazel had no medications administered during this visit.   Procedure Orders     LUMBAR FACET(MEDIAL BRANCH NERVE BLOCK) MBNB     SHOULDER INJECTION  Lab Orders     Magnesium     Sedimentation rate     ToxASSURE Select 13 (MW), Urine  Imaging Orders     DG Lumbar Spine Complete W/Bend     DG Shoulder Left Referral Orders  No referral(s) requested today    Interventional therapies: Planned, scheduled, and/or pending: Palliative bilateral lumbar facet block Left Intra-articular shoulder injections.    Considering:  Series of 5 Hyalgan knee injections    Palliative PRN treatment(s):  Intra-articular Hyalgan knee injections.  Palliative bilateral genicular nerve block  Palliative bilateral lumbar facet block       Provider-requested follow-up: Return in about 3 months (around 05/02/2018) for MedMgmt with Me Don[facility]).  Future Appointments  Date Time Provider [place]  02/02/2018  9:45 AM  Milinda Pointer, MD [facility] None  05/01/2018 11:00 AM Vevelyn Francois, NP [facility] None   Primary Care Physician: Idelle Crouch, MD Location: [facility] Note by: Dionisio David, NP Date: 01/30/2018; Time: 10:59 AM   Pain Score Disclaimer: We use the NRS-11 scale. This is a self-reported, subjective measurement of pain severity with only modest accuracy. It is used primarily to identify changes within a particular patient. It must be understood that outpatient pain scales are significantly less accurate that those used for research, where they can be applied under ideal controlled circumstances with minimal exposure to variables. In reality, the score is likely to be a combination of pain intensity and pain affect, where pain affect describes the degree  of emotional arousal or changes in action readiness caused by the sensory experience of pain. Factors such as social and work situation, setting, emotional state, anxiety levels, expectation, and prior pain experience may influence pain perception and show large inter-individual differences that may also be affected by time variables.  Patient instructions provided during this appointment: Patient Instructions   ____________________________________________________________________________________________  Medication Rules  Applies to: All patients receiving prescriptions (written or electronic).  Pharmacy of record: Pharmacy where electronic prescriptions will be sent. If written prescriptions are taken to a different pharmacy, please inform the nursing staff. The pharmacy listed in the electronic medical record should be the one where you would like electronic prescriptions to be sent.  Prescription refills: Only during scheduled appointments. Applies to both, written and electronic prescriptions.  NOTE: The following applies primarily to controlled substances (Opioid* Pain Medications).   Patient's  responsibilities: 1. Pain Pills: Bring all pain pills to every appointment (except for procedure appointments). 2. Pill Bottles: Bring pills in original pharmacy bottle. Always bring newest bottle. Bring bottle, even if empty. 3. Medication refills: You are responsible for knowing and keeping track of what medications you need refilled. The day before your appointment, write a list of all prescriptions that need to be refilled. Bring that list to your appointment and give it to the admitting nurse. Prescriptions will be written only during appointments. If you forget a medication, it will not be "Called in", "Faxed", or "electronically sent". You will need to get another appointment to get these prescribed. 4. Prescription Accuracy: You are responsible for carefully inspecting your prescriptions before leaving our office. Have the discharge nurse carefully go over each prescription with you, before taking them home. Make sure that your name is accurately spelled, that your address is correct. Check the name and dose of your medication to make sure it is accurate. Check the number of pills, and the written instructions to make sure they are clear and accurate. Make sure that you are given enough medication to last until your next medication refill appointment. 5. Taking Medication: Take medication as prescribed. Never take more pills than instructed. Never take medication more frequently than prescribed. Taking less pills or less frequently is permitted and encouraged, when it comes to controlled substances (written prescriptions).  6. Inform other Doctors: Always inform, all of your healthcare providers, of all the medications you take. 7. Pain Medication from other Providers: You are not allowed to accept any additional pain medication from any other Doctor or Healthcare provider. There are two exceptions to this rule. (see below) In the event that you require additional pain medication, you are responsible  for notifying us, as stated below. 8. Medication Agreement: You are responsible for carefully reading and following our Medication Agreement. This must be signed before receiving any prescriptions from our practice. Safely store a copy of your signed Agreement. Violations to the Agreement will result in no further prescriptions. (Additional copies of our Medication Agreement are available upon request.) 9. Laws, Rules, & Regulations: All patients are expected to follow all Federal and [employer], [employer], Rules, [employer]. Ignorance of the Laws does not constitute a valid excuse. The use of any illegal substances is prohibited. 10. Adopted CDC guidelines & recommendations: Target dosing levels will be at or below 60 MME/day. Use of benzodiazepines** is not recommended.  Exceptions: There are only two exceptions to the rule of not receiving pain medications from other Healthcare Providers. 1. Exception #1 (Emergencies): In the event of an  emergency (i.e.: accident requiring emergency care), you are allowed to receive additional pain medication. However, you are responsible for: As soon as you are able, call our office [phone], at any time of the day or night, and leave a message stating your name, the date and nature of the emergency, and the name and dose of the medication prescribed. In the event that your call is answered by a member of our staff, make sure to document and save the date, time, and the name of the person that took your information.  2. Exception #2 (Planned Surgery): In the event that you are scheduled by another doctor or dentist to have any type of surgery or procedure, you are allowed (for a period no longer than 30 days), to receive additional pain medication, for the acute post-op pain. However, in this case, you are responsible for picking up a copy of our "Post-op Pain Management for Surgeons" handout, and giving it to your surgeon or dentist. This document is available at  our office, and does not require an appointment to obtain it. Simply go to our office during business hours (Monday-Thursday from 8:00 AM to 4:00 PM) (Friday 8:00 AM to 12:00 Noon) or if you have a scheduled appointment with [place], prior to your surgery, and ask for it by name. In addition, you will need to provide [place] with your name, name of your surgeon, type of surgery, and date of procedure or surgery.  *Opioid medications include: morphine, codeine, oxycodone, oxymorphone, hydrocodone, hydromorphone, meperidine, tramadol, tapentadol, buprenorphine, fentanyl, methadone. **Benzodiazepine medications include: diazepam (Valium), alprazolam (Xanax), clonazepam (Klonopine), lorazepam (Ativan), clorazepate (Tranxene), chlordiazepoxide (Librium), estazolam (Prosom), oxazepam (Serax), temazepam (Restoril), triazolam (Halcion) (Last updated: 11/10/2017) ____________________________________________________________________________________________    BMI Assessment: Estimated body mass index is 65.23 kg/m as calculated from the following:   Height as of this encounter: 5' 4" (1.626 m).   Weight as of this encounter: 380 lb (172.4 kg).  BMI interpretation table: BMI level Category Range association with higher incidence of chronic pain  <18 kg/m2 Underweight   18.5-24.9 kg/m2 Ideal body weight   25-29.9 kg/m2 Overweight Increased incidence by 20%  30-34.9 kg/m2 Obese (Class I) Increased incidence by 68%  35-39.9 kg/m2 Severe obesity (Class II) Increased incidence by 136%  >40 kg/m2 Extreme obesity (Class III) Increased incidence by 254%   Patient's current BMI Ideal Body weight  Body mass index is 65.23 kg/m. Ideal body weight: 54.7 kg (120 lb 9.5 oz) Adjusted ideal body weight: 101.8 kg (224 lb 5.7 oz)   BMI Readings from Last 4 Encounters:  01/30/18 65.23 kg/m  01/19/18 65.40 kg/m  01/03/18 66.09 kg/m  12/06/17 66.09 kg/m   Wt Readings from Last 4 Encounters:  01/30/18 (!) 380 lb (172.4  kg)  01/19/18 (!) 381 lb (172.8 kg)  01/03/18 (!) 385 lb (174.6 kg)  12/06/17 (!) 385 lb (174.6 kg)  ____________________________________________________________________________________________  Preparing for Procedure with Sedation  Instructions: . Oral Intake: Do not eat or drink anything for at least 8 hours prior to your procedure. . Transportation: Public transportation is not allowed. Bring an adult driver. The driver must be physically present in our waiting room before any procedure can be started. Marland Kitchen Physical Assistance: Bring an adult physically capable of assisting you, in the event you need help. This adult should keep you company at home for at least 6 hours after the procedure. . Blood Pressure Medicine: Take your blood pressure medicine with a sip of water the morning of the  procedure. . Blood thinners:  . Diabetics on insulin: Notify the staff so that you can be scheduled 1st case in the morning. If your diabetes requires high dose insulin, take only  of your normal insulin dose the morning of the procedure and notify the staff that you have done so. . Preventing infections: Shower with an antibacterial soap the morning of your procedure. . Build-up your immune system: Take 1000 mg of Vitamin C with every meal (3 times a day) the day prior to your procedure. Marland Kitchen Antibiotics: Inform the staff if you have a condition or reason that requires you to take antibiotics before dental procedures. . Pregnancy: If you are pregnant, call and cancel the procedure. . Sickness: If you have a cold, fever, or any active infections, call and cancel the procedure. . Arrival: You must be in the facility at least 30 minutes prior to your scheduled procedure. . Children: Do not bring children with you. . Dress appropriately: Bring dark clothing that you would not mind if they get stained. . Valuables: Do not bring any jewelry or valuables.  Procedure appointments are reserved for interventional  treatments only. Marland Kitchen No Prescription Refills. . No medication changes will be discussed during procedure appointments. . No disability issues will be discussed.  Remember:  Regular Business hours are:  Monday to Thursday 8:00 AM to 4:00 PM  Provider's Schedule: Milinda Pointer, MD:  Procedure days: Tuesday and Thursday 7:30 AM to 4:00 PM  Gillis Santa, MD:  Procedure days: Monday and Wednesday 7:30 AM to 4:00 PM ____________________________________________________________________________________________

## 2018-01-30 NOTE — Patient Instructions (Addendum)
____________________________________________________________________________________________  Medication Rules  Applies to: All patients receiving prescriptions (written or electronic).  Pharmacy of record: Pharmacy where electronic prescriptions will be sent. If written prescriptions are taken to a different pharmacy, please inform the nursing staff. The pharmacy listed in the electronic medical record should be the one where you would like electronic prescriptions to be sent.  Prescription refills: Only during scheduled appointments. Applies to both, written and electronic prescriptions.  NOTE: The following applies primarily to controlled substances (Opioid* Pain Medications).   Patient's responsibilities: 1. Pain Pills: Bring all pain pills to every appointment (except for procedure appointments). 2. Pill Bottles: Bring pills in original pharmacy bottle. Always bring newest bottle. Bring bottle, even if empty. 3. Medication refills: You are responsible for knowing and keeping track of what medications you need refilled. The day before your appointment, write a list of all prescriptions that need to be refilled. Bring that list to your appointment and give it to the admitting nurse. Prescriptions will be written only during appointments. If you forget a medication, it will not be "Called in", "Faxed", or "electronically sent". You will need to get another appointment to get these prescribed. 4. Prescription Accuracy: You are responsible for carefully inspecting your prescriptions before leaving our office. Have the discharge nurse carefully go over each prescription with you, before taking them home. Make sure that your name is accurately spelled, that your address is correct. Check the name and dose of your medication to make sure it is accurate. Check the number of pills, and the written instructions to make sure they are clear and accurate. Make sure that you are given enough medication to last  until your next medication refill appointment. 5. Taking Medication: Take medication as prescribed. Never take more pills than instructed. Never take medication more frequently than prescribed. Taking less pills or less frequently is permitted and encouraged, when it comes to controlled substances (written prescriptions).  6. Inform other Doctors: Always inform, all of your healthcare providers, of all the medications you take. 7. Pain Medication from other Providers: You are not allowed to accept any additional pain medication from any other Doctor or Healthcare provider. There are two exceptions to this rule. (see below) In the event that you require additional pain medication, you are responsible for notifying us, as stated below. 8. Medication Agreement: You are responsible for carefully reading and following our Medication Agreement. This must be signed before receiving any prescriptions from our practice. Safely store a copy of your signed Agreement. Violations to the Agreement will result in no further prescriptions. (Additional copies of our Medication Agreement are available upon request.) 9. Laws, Rules, & Regulations: All patients are expected to follow all Federal and State Laws, Statutes, Rules, & Regulations. Ignorance of the Laws does not constitute a valid excuse. The use of any illegal substances is prohibited. 10. Adopted CDC guidelines & recommendations: Target dosing levels will be at or below 60 MME/day. Use of benzodiazepines** is not recommended.  Exceptions: There are only two exceptions to the rule of not receiving pain medications from other Healthcare Providers. 1. Exception #1 (Emergencies): In the event of an emergency (i.e.: accident requiring emergency care), you are allowed to receive additional pain medication. However, you are responsible for: As soon as you are able, call our office [phone], at any time of the day or night, and leave a message stating your name, the  date and nature of the emergency, and the name and dose of the medication   prescribed. In the event that your call is answered by a member of our staff, make sure to document and save the date, time, and the name of the person that took your information.  2. Exception #2 (Planned Surgery): In the event that you are scheduled by another doctor or dentist to have any type of surgery or procedure, you are allowed (for a period no longer than 30 days), to receive additional pain medication, for the acute post-op pain. However, in this case, you are responsible for picking up a copy of our "Post-op Pain Management for Surgeons" handout, and giving it to your surgeon or dentist. This document is available at our office, and does not require an appointment to obtain it. Simply go to our office during business hours (Monday-Thursday from 8:00 AM to 4:00 PM) (Friday 8:00 AM to 12:00 Noon) or if you have a scheduled appointment with [place], prior to your surgery, and ask for it by name. In addition, you will need to provide [place] with your name, name of your surgeon, type of surgery, and date of procedure or surgery.  *Opioid medications include: morphine, codeine, oxycodone, oxymorphone, hydrocodone, hydromorphone, meperidine, tramadol, tapentadol, buprenorphine, fentanyl, methadone. **Benzodiazepine medications include: diazepam (Valium), alprazolam (Xanax), clonazepam (Klonopine), lorazepam (Ativan), clorazepate (Tranxene), chlordiazepoxide (Librium), estazolam (Prosom), oxazepam (Serax), temazepam (Restoril), triazolam (Halcion) (Last updated: 11/10/2017) ____________________________________________________________________________________________    BMI Assessment: Estimated body mass index is 65.23 kg/m as calculated from the following:   Height as of this encounter: 5\' 4"  (1.626 m).   Weight as of this encounter: 380 lb (172.4 kg).  BMI interpretation table: BMI level Category Range association with higher  incidence of chronic pain  <18 kg/m2 Underweight   18.5-24.9 kg/m2 Ideal body weight   25-29.9 kg/m2 Overweight Increased incidence by 20%  30-34.9 kg/m2 Obese (Class I) Increased incidence by 68%  35-39.9 kg/m2 Severe obesity (Class II) Increased incidence by 136%  >40 kg/m2 Extreme obesity (Class III) Increased incidence by 254%   Patient's current BMI Ideal Body weight  Body mass index is 65.23 kg/m. Ideal body weight: 54.7 kg (120 lb 9.5 oz) Adjusted ideal body weight: 101.8 kg (224 lb 5.7 oz)   BMI Readings from Last 4 Encounters:  01/30/18 65.23 kg/m  01/19/18 65.40 kg/m  01/03/18 66.09 kg/m  12/06/17 66.09 kg/m   Wt Readings from Last 4 Encounters:  01/30/18 (!) 380 lb (172.4 kg)  01/19/18 (!) 381 lb (172.8 kg)  01/03/18 (!) 385 lb (174.6 kg)  12/06/17 (!) 385 lb (174.6 kg)  ____________________________________________________________________________________________  Preparing for Procedure with Sedation  Instructions: . Oral Intake: Do not eat or drink anything for at least 8 hours prior to your procedure. . Transportation: Public transportation is not allowed. Bring an adult driver. The driver must be physically present in our waiting room before any procedure can be started. Marland Kitchen Physical Assistance: Bring an adult physically capable of assisting you, in the event you need help. This adult should keep you company at home for at least 6 hours after the procedure. . Blood Pressure Medicine: Take your blood pressure medicine with a sip of water the morning of the procedure. . Blood thinners:  . Diabetics on insulin: Notify the staff so that you can be scheduled 1st case in the morning. If your diabetes requires high dose insulin, take only  of your normal insulin dose the morning of the procedure and notify the staff that you have done so. . Preventing infections: Shower with an antibacterial soap the  morning of your procedure. . Build-up your immune system: Take 1000 mg  of Vitamin C with every meal (3 times a day) the day prior to your procedure. Marland Kitchen Antibiotics: Inform the staff if you have a condition or reason that requires you to take antibiotics before dental procedures. . Pregnancy: If you are pregnant, call and cancel the procedure. . Sickness: If you have a cold, fever, or any active infections, call and cancel the procedure. . Arrival: You must be in the facility at least 30 minutes prior to your scheduled procedure. . Children: Do not bring children with you. . Dress appropriately: Bring dark clothing that you would not mind if they get stained. . Valuables: Do not bring any jewelry or valuables.  Procedure appointments are reserved for interventional treatments only. Marland Kitchen No Prescription Refills. . No medication changes will be discussed during procedure appointments. . No disability issues will be discussed.  Remember:  Regular Business hours are:  Monday to Thursday 8:00 AM to 4:00 PM  Provider's Schedule: Milinda Pointer, MD:  Procedure days: Tuesday and Thursday 7:30 AM to 4:00 PM  Gillis Santa, MD:  Procedure days: Monday and Wednesday 7:30 AM to 4:00 PM ____________________________________________________________________________________________

## 2018-01-30 NOTE — Progress Notes (Signed)
Nursing Pain Medication Assessment:  Safety precautions to be maintained throughout the outpatient stay will include: orient to surroundings, keep bed in low position, maintain call bell within reach at all times, provide assistance with transfer out of bed and ambulation.  Medication Inspection Compliance: Pill count conducted under aseptic conditions, in front of the patient. Neither the pills nor the bottle was removed from the patient's sight at any time. Once count was completed pills were immediately returned to the patient in their original bottle.  Medication: Oxycodone IR Pill/Patch Count: 18 of 150 pills remain Pill/Patch Appearance: Markings consistent with prescribed medication Bottle Appearance: Standard pharmacy container. Clearly labeled. Filled Date: 03 /13 / 2019 Last Medication intake:  Today

## 2018-01-31 LAB — MAGNESIUM: MAGNESIUM: 1.6 mg/dL (ref 1.6–2.3)

## 2018-01-31 LAB — SEDIMENTATION RATE: SED RATE: 20 mm/h (ref 0–32)

## 2018-02-02 ENCOUNTER — Ambulatory Visit: Payer: Medicare HMO | Admitting: Pain Medicine

## 2018-02-02 NOTE — Progress Notes (Deleted)
Patient's Name: Karen Lewis  MRN: [id number]  Referring Provider: Idelle Crouch, MD  DOB: 11/27/1970  PCP: Idelle Crouch, MD  DOS: 02/02/2018  Note by: Gaspar Cola, MD  Service setting: Ambulatory outpatient  Specialty: Interventional Pain Management  Patient type: Established  Location: [facility] (AMB) Pain Management Facility  Visit type: Interventional Procedure   Primary Reason for Visit: Interventional Pain Management Treatment. CC: No chief complaint on file.  Procedure:  Anesthesia, Analgesia, Anxiolysis:  Type: Therapeutic Intra-Articular Hyalgan Knee Injection #4  Region: Lateral infrapatellar Knee Region Level: Knee Joint Laterality: Bilateral  Type: Local Anesthesia Indication(s): Analgesia         Local Anesthetic: Lidocaine 1-2% Route: Infiltration ([place]/IM) IV Access: Declined Sedation: Declined    Indications: 1. Secondary Osteoarthritis of knee (Bilateral) (R>L)   2. Chronic knee pain (Primary Source of Pain) (Bilateral) (R>L)   3. Morbid obesity with body mass index (BMI) greater than or equal to 70 in adult [facility])    Pain Score: Pre-procedure:  /10 Post-procedure:  /10  Pre-op Assessment:  Karen Lewis is a 47 y.o. (year old), female patient, seen today for interventional treatment. She  has a past surgical history that includes Laparoscopic partial gastrectomy; Shoulder arthroscopy (Right); Joint replacement (Left, hip); Carpal tunnel release (Bilateral); Diagnostic laparoscopy; Cholecystectomy; Trigger finger release (Right); Thyroidectomy (N/A, 11/12/2015); left trigger finger; Roux-en-Y Gastric Bypass (06/03/2017); and Hiatal hernia repair. Karen Lewis has a current medication list which includes the following prescription(s): acetaminophen, acyclovir, albuterol, amitriptyline, accu-chek aviva, accu-chek aviva plus, butalbital-acetaminophen-caffeine, diphenhydramine, estradiol, famotidine, gabapentin, guaifenesin-codeine, ipratropium-albuterol, ketoconazole, simple  diagnostics lancing dev, levocetirizine, levothyroxine, levothyroxine, magnesium oxide, meclizine, medroxyprogesterone, metformin, metoprolol tartrate, montelukast, multivitamin with minerals, naloxone, nitrofurantoin (macrocrystal-monohydrate), omeprazole, oxycodone, oxycodone, oxycodone, phentermine, pioglitazone, quetiapine, ranitidine, ranitidine, rosuvastatin, sertraline, spironolactone, tizanidine, triamcinolone, triamcinolone, true metrix blood glucose test, ulticare alcohol swabs, and zonisamide. Her primarily concern today is the No chief complaint on file.  Initial Vital Signs:  Pulse/HCG Rate:    Temp:   Resp:   BP:   SpO2:    BMI: Estimated body mass index is 65.23 kg/m as calculated from the following:   Height as of 01/30/18: 5\' 4"  (1.626 m).   Weight as of 01/30/18: 380 lb (172.4 kg).  Risk Assessment: Allergies: Reviewed. She is allergic to bactrim [sulfamethoxazole-trimethoprim]; omalizumab; ciprofloxacin; shellfish allergy; aspirin; clindamycin; motrin [ibuprofen]; nsaids; other; and sulfa antibiotics.  Allergy Precautions: None required Coagulopathies: Reviewed. None identified.  Blood-thinner therapy: None at this time Active Infection(s): Reviewed. None identified. [name] is afebrile  Site Confirmation: [name] was asked to confirm the procedure and laterality before marking the site Procedure checklist: Completed Consent: Before the procedure and under the influence of no sedative(s), amnesic(s), or anxiolytics, the patient was informed of the treatment options, risks and possible complications. To fulfill our ethical and legal obligations, as recommended by the American Medical Association's Code of Ethics, I have informed the patient of my clinical impression; the nature and purpose of the treatment or procedure; the risks, benefits, and possible complications of the intervention; the alternatives, including doing nothing; the risk(s) and benefit(s) of the alternative  treatment(s) or procedure(s); and the risk(s) and benefit(s) of doing nothing. The patient was provided information about the general risks and possible complications associated with the procedure. These may include, but are not limited to: failure to achieve desired goals, infection, bleeding, organ or nerve damage, allergic reactions, paralysis, and death. In addition, the patient was informed of those risks and complications  associated to the procedure, such as failure to decrease pain; infection; bleeding; organ or nerve damage with subsequent damage to sensory, motor, and/or autonomic systems, resulting in permanent pain, numbness, and/or weakness of one or several areas of the body; allergic reactions; (i.e.: anaphylactic reaction); and/or death. Furthermore, the patient was informed of those risks and complications associated with the medications. These include, but are not limited to: allergic reactions (i.e.: anaphylactic or anaphylactoid reaction(s)); adrenal axis suppression; blood sugar elevation that in diabetics may result in ketoacidosis or comma; water retention that in patients with history of congestive heart failure may result in shortness of breath, pulmonary edema, and decompensation with resultant heart failure; weight gain; swelling or edema; medication-induced neural toxicity; particulate matter embolism and blood vessel occlusion with resultant organ, and/or nervous system infarction; and/or aseptic necrosis of one or more joints. Finally, the patient was informed that Medicine is not an exact science; therefore, there is also the possibility of unforeseen or unpredictable risks and/or possible complications that may result in a catastrophic outcome. The patient indicated having understood very clearly. We have given the patient no guarantees and we have made no promises. Enough time was given to the patient to ask questions, all of which were answered to the patient's satisfaction. Ms.  Keplinger has indicated that she wanted to continue with the procedure. Attestation: I, the ordering provider, attest that I have discussed with the patient the benefits, risks, side-effects, alternatives, likelihood of achieving goals, and potential problems during recovery for the procedure that I have provided informed consent. Date  Time: {CHL [facility]-PAIN TIME CHOICES:21018001}  Pre-Procedure Preparation:  Monitoring: As per clinic protocol. Respiration, ETCO2, SpO2, BP, heart rate and rhythm monitor placed and checked for adequate function Safety Precautions: Patient was assessed for positional comfort and pressure points before starting the procedure. Time-out: I initiated and conducted the "Time-out" before starting the procedure, as per protocol. The patient was asked to participate by confirming the accuracy of the "Time Out" information. Verification of the correct person, site, and procedure were performed and confirmed by me, the nursing staff, and the patient. "Time-out" conducted as per Joint Commission's Universal Protocol (UP.01.01.01). Time:    Description of Procedure:       Position: Supine Target Area: Knee Joint Approach: Just above the Lateral tibial plateau, lateral to the infrapatellar tendon. Area Prepped: Entire knee area, from the mid-thigh to the mid-shin. Prepping solution: ChloraPrep (2% chlorhexidine gluconate and 70% isopropyl alcohol) Safety Precautions: Aspiration looking for blood return was conducted prior to all injections. At no point did we inject any substances, as a needle was being advanced. No attempts were made at seeking any paresthesias. Safe injection practices and needle disposal techniques used. Medications properly checked for expiration dates. SDV (single dose vial) medications used. Description of the Procedure: Protocol guidelines were followed. The patient was placed in position over the fluoroscopy table. The target area was identified and the area  prepped in the usual manner. Skin desensitized using vapocoolant spray. Skin & deeper tissues infiltrated with local anesthetic. Appropriate amount of time allowed to pass for local anesthetics to take effect. The procedure needles were then advanced to the target area. Proper needle placement secured. Negative aspiration confirmed. Solution injected in intermittent fashion, asking for systemic symptoms every 0.5cc of injectate. The needles were then removed and the area cleansed, making sure to leave some of the prepping solution back to take advantage of its long term bactericidal properties. There were no vitals filed for this  visit.  Start Time:   hrs. End Time:   hrs. Materials:  Needle(s) Type: Regular needle Gauge: 22G Length: 3.5-in Medication(s): Please see orders for medications and dosing details.  Imaging Guidance:  Type of Imaging Technique: Fluoroscopy Guidance (Non-spinal) Indication(s): Assistance in needle guidance and placement for procedures requiring needle placement in or near specific anatomical locations not easily accessible without such assistance. Exposure Time: Please see nurses notes. Contrast: None used. Fluoroscopic Guidance: I was personally present during the use of fluoroscopy. "Tunnel Vision Technique" used to obtain the best possible view of the target area. Parallax error corrected before commencing the procedure. "Direction-depth-direction" technique used to introduce the needle under continuous pulsed fluoroscopy. Once target was reached, antero-posterior, oblique, and lateral fluoroscopic projection used confirm needle placement in all planes. Images permanently stored in EMR. Ultrasound Guidance: N/A Interpretation: No contrast injected. I personally interpreted the imaging intraoperatively. Adequate needle placement confirmed in multiple planes. Permanent images saved into the patient's record.  Antibiotic Prophylaxis:   Anti-infectives (From admission,  onward)   None     Indication(s): None identified  Post-operative Assessment:  Post-procedure Vital Signs:  Pulse/HCG Rate:    Temp:   Resp:   BP:   SpO2:    EBL: None  Complications: No immediate post-treatment complications observed by team, or reported by patient.  Note: The patient tolerated the entire procedure well. A repeat set of vitals were taken after the procedure and the patient was kept under observation following institutional policy, for this type of procedure. Post-procedural neurological assessment was performed, showing return to baseline, prior to discharge. The patient was provided with post-procedure discharge instructions, including a section on how to identify potential problems. Should any problems arise concerning this procedure, the patient was given instructions to immediately contact us, at any time, without hesitation. In any case, we plan to contact the patient by telephone for a follow-up status report regarding this interventional procedure.  Comments:  No additional relevant information.  Plan of Care   Imaging Orders  No imaging studies ordered today   Procedure Orders    No procedure(s) ordered today    Medications ordered for procedure: No orders of the defined types were placed in this encounter.  Medications administered: Kahli A. Hunkele had no medications administered during this visit.  See the medical record for exact dosing, route, and time of administration.  New Prescriptions   No medications on file   Disposition: Discharge home  Discharge Date & Time: 02/02/2018;   hrs.   Physician-requested Follow-up: No follow-ups on file.  Future Appointments  Date Time Provider [place]  02/02/2018  9:45 AM Milinda Pointer, MD [facility] None  05/01/2018 11:00 AM Vevelyn Francois, NP [facility] None   Primary Care Physician: Idelle Crouch, MD Location: [facility] Outpatient Pain Management Facility Note by: Gaspar Cola,  MD Date: 02/02/2018; Time: 7:20 AM  Disclaimer:  Medicine is not an Chief Strategy Officer. The only guarantee in medicine is that nothing is guaranteed. It is important to note that the decision to proceed with this intervention was based on the information collected from the patient. The Data and conclusions were drawn from the patient's questionnaire, the interview, and the physical examination. Because the information was provided in large part by the patient, it cannot be guaranteed that it has not been purposely or unconsciously manipulated. Every effort has been made to obtain as much relevant data as possible for this evaluation. It is important to note that the conclusions  that lead to this procedure are derived in large part from the available data. Always take into account that the treatment will also be dependent on availability of resources and existing treatment guidelines, considered by other Pain Management Practitioners as being common knowledge and practice, at the time of the intervention. For Medico-Legal purposes, it is also important to point out that variation in procedural techniques and pharmacological choices are the acceptable norm. The indications, contraindications, technique, and results of the above procedure should only be interpreted and judged by a Board-Certified Interventional Pain Specialist with extensive familiarity and expertise in the same exact procedure and technique.

## 2018-02-03 ENCOUNTER — Other Ambulatory Visit: Payer: Self-pay

## 2018-02-03 ENCOUNTER — Emergency Department: Payer: Medicare HMO

## 2018-02-03 ENCOUNTER — Encounter: Payer: Self-pay | Admitting: Emergency Medicine

## 2018-02-03 ENCOUNTER — Emergency Department
Admission: EM | Admit: 2018-02-03 | Discharge: 2018-02-03 | Disposition: A | Discharge status: Home / Self Care | Payer: Medicare HMO | Attending: Emergency Medicine | Admitting: Emergency Medicine

## 2018-02-03 DIAGNOSIS — Y92009 Unspecified place in unspecified non-institutional (private) residence as the place of occurrence of the external cause: Secondary | ICD-10-CM | POA: Diagnosis not present

## 2018-02-03 DIAGNOSIS — Z9884 Bariatric surgery status: Secondary | ICD-10-CM | POA: Diagnosis not present

## 2018-02-03 DIAGNOSIS — Z79899 Other long term (current) drug therapy: Secondary | ICD-10-CM | POA: Insufficient documentation

## 2018-02-03 DIAGNOSIS — S2232XA Fracture of one rib, left side, initial encounter for closed fracture: Secondary | ICD-10-CM | POA: Insufficient documentation

## 2018-02-03 DIAGNOSIS — E119 Type 2 diabetes mellitus without complications: Secondary | ICD-10-CM | POA: Insufficient documentation

## 2018-02-03 DIAGNOSIS — Z7984 Long term (current) use of oral hypoglycemic drugs: Secondary | ICD-10-CM | POA: Diagnosis not present

## 2018-02-03 DIAGNOSIS — J45909 Unspecified asthma, uncomplicated: Secondary | ICD-10-CM | POA: Insufficient documentation

## 2018-02-03 DIAGNOSIS — W19XXXA Unspecified fall, initial encounter: Secondary | ICD-10-CM | POA: Diagnosis not present

## 2018-02-03 DIAGNOSIS — Y939 Activity, unspecified: Secondary | ICD-10-CM | POA: Insufficient documentation

## 2018-02-03 DIAGNOSIS — I10 Essential (primary) hypertension: Secondary | ICD-10-CM | POA: Insufficient documentation

## 2018-02-03 DIAGNOSIS — S299XXA Unspecified injury of thorax, initial encounter: Secondary | ICD-10-CM | POA: Diagnosis present

## 2018-02-03 DIAGNOSIS — S2242XA Multiple fractures of ribs, left side, initial encounter for closed fracture: Secondary | ICD-10-CM | POA: Diagnosis not present

## 2018-02-03 DIAGNOSIS — Y998 Other external cause status: Secondary | ICD-10-CM | POA: Diagnosis not present

## 2018-02-03 MED ORDER — KETOROLAC TROMETHAMINE 10 MG PO TABS: 10.0000 mg | ORAL_TABLET | Freq: Four times a day (QID) | ORAL | 0 refills | Status: DC | PRN

## 2018-02-03 MED ORDER — OXYCODONE-ACETAMINOPHEN 5-325 MG PO TABS
1.0000 | ORAL_TABLET | Freq: Once | ORAL | Status: DC
  Filled 2018-02-03: qty 1

## 2018-02-03 MED ORDER — KETOROLAC TROMETHAMINE 30 MG/ML IJ SOLN
30.0000 mg | Freq: Once | INTRAMUSCULAR | Status: AC
  Administered 2018-02-03: 30 mg via INTRAMUSCULAR
  Filled 2018-02-03: qty 1

## 2018-02-03 NOTE — ED Notes (Signed)
See triage note  States she fell slightly yesterday  States she hit her left rib area on arm of chair   Having increased pain under left breast area

## 2018-02-03 NOTE — ED Provider Notes (Signed)
[facility] Emergency Department Provider Note  ____________________________________________  Time seen: Approximately 12:37 PM  I have reviewed the triage vital signs and the nursing notes.   HISTORY  Chief Complaint Fall    HPI Karen Lewis is a 47 y.o. female that presents to the emergency department for evaluation of left rib pain after falling on the arm of a chair yesterday.  Pain is worse with deep breathing. Patient states that she has tried heat, ice, over-the-counter pain medication without relief.  No additional injuries.   Past Medical History:  Diagnosis Date  . Anemia   . Anginal pain ([place])   . Anxiety   . Arthralgia of hip 07/29/2015  . Arthritis   . Arthritis, degenerative 07/29/2015  . Asthma   . Cephalalgia 07/25/2014  . Dependence on unknown drug ([place])    multiplt controlled drug dependence  . Depression   . Diabetes mellitus without complication ([place])   . Dysrhythmia   . Eczema   . Fibromyalgia   . Gastritis   . GERD (gastroesophageal reflux disease)   . Gonalgia 07/29/2015   Overview:  Overview:  The patient has had bilateral intra-articular Hyalgan injections done on 07/16/2014 and although she seems to do well with this type of therapy, apparently her insurance company does not want to pay for they Hyalgan. On 11/27/2014 the patient underwent a bilateral genicular nerve block with excellent results. On 01/28/2015 she had a right knee genicular radiofrequency ablatio  . Gout   . H/O cardiovascular disorder 03/10/2015  . H/O surgical procedure 12/05/2012   Overview:  LSG ([facility] - April 2013)    . H/O thyroid disease 03/10/2015  . Headache   . Herpes   . History of artificial joint 07/29/2015  . History of hiatal hernia   . Hypertension   . Hypomagnesemia   . Hypothyroidism   . LBP (low back pain) 07/29/2015  . Neuromuscular disorder ([place])   . Obesity   . PCOS (polycystic ovarian syndrome)   . Primary osteoarthritis of both  knees 07/29/2015  . Sleep apnea   . Thyroid nodule bilateral    Patient Active Problem List   Diagnosis Date Noted  . Depression 01/19/2018  . Secondary Osteoarthritis of knee (Bilateral) (R>L) 01/02/2018  . Post-operative hypothyroidism 12/05/2017  . Osteoarthritis of hip (Right) 07/07/2017  . Osteoarthritis of shoulder (Left) 05/31/2017  . Upper extremity pain 04/21/2017  . Chronic shoulder pain (Left) 04/21/2017  . Chronic pain syndrome 12/02/2016  . Neurogenic pain 12/02/2016  . Chest pain with low risk of acute coronary syndrome 08/04/2016  . SOB (shortness of breath) on exertion 08/04/2016  . Dysphagia, unspecified 05/04/2016  . Gastroesophageal reflux disease without esophagitis 05/04/2016  . Vitamin D insufficiency 11/27/2015  . Nontoxic goiter 11/12/2015  . Other psychoactive substance dependence, uncomplicated ([place]) 63/89/3734  . Controlled drug dependence ([place]) 10/13/2015  . Lumbar spondylosis 09/18/2015  . Hypomagnesemia 08/27/2015  . Morbid obesity with body mass index (BMI) greater than or equal to 70 in adult [facility]) 08/27/2015  . Long term current use of opiate analgesic 07/29/2015  . Long term prescription opiate use 07/29/2015  . Opiate use 07/29/2015  . Encounter for therapeutic drug level monitoring 07/29/2015  . Opiate dependence ([place]) 07/29/2015  . Chronic knee pain (Primary Source of Pain) (Bilateral) (R>L) 07/29/2015  . Chronic low back pain [facility] source of pain) (Bilateral) (R>L) 07/29/2015  . Lumbar facet syndrome (Bilateral) (R>L) 07/29/2015  . Osteoarthritis 07/29/2015  . Grade 1 (  1.4 cm) Anterolisthesis of L4 over L5 07/29/2015  . Chronic hip pain (Secondary source of pain) (Right) 07/29/2015  . S/P THR (total hip replacement) (Left) 07/29/2015  . History of methicillin resistant staphylococcus aureus (MRSA) 07/29/2015  . History of bariatric surgery 07/29/2015  . History of methicillin resistant Staphylococcus aureus infection 07/29/2015  .  Eczema 05/21/2015  . Gout 05/21/2015  . Headache, migraine 05/21/2015  . Bilateral polycystic ovarian syndrome 05/21/2015  . Disease of thyroid gland 05/21/2015  . Major depressive disorder, single episode 05/21/2015  . Dermatitis 05/21/2015  . Obstructive apnea 04/30/2015  . History of asthma 03/10/2015  . Binge eating disorder 03/10/2015  . Fibromyalgia 03/10/2015  . Anxiety, generalized 03/10/2015  . Insomnia, persistent 03/10/2015  . Depression, major, recurrent, moderate ([place]) 03/10/2015  . Avitaminosis D 04/30/2014  . Hyperlipidemia 04/25/2014  . Hyperlipidemia due to type 2 diabetes mellitus ([place]) 04/25/2014  . Uncomplicated asthma 31/59/4585  . Asthma 03/19/2014  . Goiter, nontoxic, multinodular 10/09/2013  . Apnea, sleep 12/05/2012  . History of surgical procedure 12/05/2012  . Type 2 diabetes mellitus without complication, without long-term current use of insulin ([place]) 12/05/2012  . Hypertension associated with diabetes ([place]) 12/05/2012    Past Surgical History:  Procedure Laterality Date  . CARPAL TUNNEL RELEASE Bilateral   . CHOLECYSTECTOMY    . DIAGNOSTIC LAPAROSCOPY    . HIATAL HERNIA REPAIR    . JOINT REPLACEMENT Left hip  . LAPAROSCOPIC PARTIAL GASTRECTOMY    . left trigger finger    . ROUX-EN-Y GASTRIC BYPASS  06/03/2017  . SHOULDER ARTHROSCOPY Right   . THYROIDECTOMY N/A 11/12/2015   Procedure: THYROIDECTOMY;  Surgeon: Clyde Canterbury, MD;  Location: [facility] ORS;  Service: ENT;  Laterality: N/A;  . TRIGGER FINGER RELEASE Right     Prior to Admission medications   Medication Sig Start Date End Date Taking? Authorizing Provider  Acetaminophen 500 MG coapsule Take 500 mg by mouth every 6 (six) hours as needed.  06/17/16   [provider]  acyclovir (ZOVIRAX) 800 MG tablet Take 800 mg by mouth 3 times/day as needed-between meals & bedtime. Reported on 11/12/2015    [provider]  albuterol (PROVENTIL HFA;VENTOLIN HFA) 108 (90 BASE) MCG/ACT inhaler  Inhale 2 puffs into the lungs every 6 (six) hours as needed for wheezing or shortness of breath.    [provider]  amitriptyline (ELAVIL) 100 MG tablet  01/12/18   [provider]  Blood Glucose Calibration (ACCU-CHEK AVIVA) SOLN  06/04/16   [provider]  Blood Glucose Monitoring Suppl (ACCU-CHEK AVIVA PLUS) w/Device KIT  06/04/16   [provider]  butalbital-acetaminophen-caffeine (FIORICET, ESGIC) 50-325-40 MG tablet 1 tablet every 6 (six) hours as needed.  10/27/15   [provider]  diphenhydrAMINE (BENADRYL) 25 mg capsule Take by mouth.    [provider]  estradiol (ESTRACE) 0.1 MG/GM vaginal cream  01/17/18   [provider]  famotidine (PEPCID) 20 MG tablet Take by mouth.    [provider]  gabapentin (NEURONTIN) 800 MG tablet Take 1 tablet (800 mg total) by mouth every 6 (six) hours. 01/30/18 04/30/18  Vevelyn Francois, NP  guaiFENesin-codeine 100-10 MG/5ML syrup Take by mouth. 09/20/16   [provider]  ipratropium-albuterol (DUONEB) 0.5-2.5 (3) MG/3ML SOLN 3 mLs as needed.  10/13/15   [provider]  ketoconazole (NIZORAL) 2 % cream APPLY AS DIRECTED TO AFFECTED AREA TWICE DAILY AS NEEDED 12/17/14   [provider]  ketorolac (TORADOL) 10  MG tablet Take 1 tablet (10 mg total) by mouth every 6 (six) hours as needed. 02/03/18   Laban Emperor, PA-C  Lancet Devices (SIMPLE DIAGNOSTICS LANCING DEV) MISC Use 1 each as directed. Accu Chek Softclix. DX: E11.8 02/16/17   [provider]  levocetirizine (XYZAL) 5 MG tablet Take 5 mg by mouth every evening.  12/29/15   [provider]  levothyroxine (SYNTHROID, LEVOTHROID) 200 MCG tablet Take on an empty stomach each morning before breakfast. Total dose 250 mcg daily. 11/26/16   [provider]  levothyroxine (SYNTHROID, LEVOTHROID) 50 MCG tablet Take 50 mcg by mouth daily before breakfast.  11/26/16 01/19/18  [provider]   magnesium oxide (MAG-OX) 400 MG tablet Take 1 tablet (400 mg total) by mouth 2 (two) times daily. 01/30/18 04/30/18  Vevelyn Francois, NP  meclizine (ANTIVERT) 25 MG tablet Take 1 tablet (25 mg total) by mouth 3 (three) times daily as needed for dizziness. 06/27/15   Hinda Kehr, MD  medroxyPROGESTERone (DEPO-PROVERA) 150 MG/ML injection Inject 1 mL (150 mg total) into the muscle every 3 (three) months. 08/15/17   Gae Dry, MD  metFORMIN (GLUCOPHAGE) 1000 MG tablet Take 1,000 mg by mouth 2 (two) times daily with a meal.     [provider]  metoprolol (LOPRESSOR) 50 MG tablet Take 75 mg by mouth 2 (two) times daily.     [provider]  montelukast (SINGULAIR) 10 MG tablet Take 10 mg by mouth at bedtime.     [provider]  Multiple Vitamin (MULTIVITAMIN WITH MINERALS) TABS tablet Take 2 tablets by mouth daily. Reported on 11/12/2015    [provider]  naloxone [facility]) nasal spray 4 mg/0.1 mL Spray into one nostril. Repeat with second device into other nostril after 2-3 minutes if no or minimal response. 02/17/17   Vevelyn Francois, NP  nitrofurantoin, macrocrystal-monohydrate, (MACROBID) 100 MG capsule  01/17/18   [provider]  omeprazole (PRILOSEC) 40 MG capsule Take 40 mg by mouth 2 (two) times daily.     [provider]  oxyCODONE (OXY IR/ROXICODONE) 5 MG immediate release tablet Take 1 tablet (5 mg total) by mouth 5 (five) times daily as needed for severe pain. 11/11/17 12/11/17  Vevelyn Francois, NP  oxyCODONE (OXY IR/ROXICODONE) 5 MG immediate release tablet Take 1 tablet (5 mg total) by mouth 5 (five) times daily as needed for severe pain. 10/12/17 11/11/17  Vevelyn Francois, NP  oxyCODONE (OXY IR/ROXICODONE) 5 MG immediate release tablet Take 1 tablet (5 mg total) by mouth 5 (five) times daily as needed for severe pain. 03/02/18 04/01/18  Vevelyn Francois, NP  phentermine 15 MG capsule Take 37.5 mg by mouth.  01/12/17 04/25/17  [provider]  pioglitazone (ACTOS) 15 MG tablet  01/17/18   [provider]  QUEtiapine (SEROQUEL) 100 MG tablet Take 1 tablet (100 mg total) by mouth at bedtime. 07/20/16   Elvin So, MD  ranitidine (ZANTAC) 150 MG capsule Take 150 mg by mouth every evening.  02/17/16 01/19/18  [provider]  ranitidine (ZANTAC) 150 MG capsule Take by mouth.    [provider]  rosuvastatin (CRESTOR) 40 MG tablet Take 40 mg by mouth daily.  11/30/16   [provider]  sertraline (ZOLOFT) 100 MG tablet Take 2 tablets (200 mg total) by mouth daily. 05/04/16   Elvin So, MD  spironolactone (ALDACTONE) 25 MG tablet Take 37.5 mg by mouth 2 (two) times daily.  [provider]  tiZANidine (ZANAFLEX) 4 MG tablet 4 mg at bedtime.  05/05/15   [provider]  triamcinolone (KENALOG) 0.1 % paste Apply 1 application topically 2 (two) times daily.  02/23/16   [provider]  triamcinolone (NASACORT AQ) 55 MCG/ACT AERO nasal inhaler 2 sprays by Nasal route 2 (two) times daily.    [provider]  TRUE METRIX BLOOD GLUCOSE TEST test strip  05/23/15   [provider]  ULTICARE ALCOHOL SWABS PADS Apply 1 each topically 2 (two) times daily. Use when checking BG. DX: E11.8 02/16/17   [provider]  zonisamide (ZONEGRAN) 50 MG capsule Take 150 mg by mouth daily. 3 tabs at bedtime    [provider]    Allergies Bactrim [sulfamethoxazole-trimethoprim]; Omalizumab; Ciprofloxacin; Shellfish allergy; Aspirin; Clindamycin; Motrin [ibuprofen]; Nsaids; Other; and Sulfa antibiotics  Family History  Problem Relation Age of Onset  . Anxiety disorder Mother   . Depression Mother   . Alcohol abuse Mother   . Diabetes Mother   . Hypertension Mother   . Kidney cancer Mother   . Sleep apnea Mother   . Alcohol abuse Father   . Anxiety disorder Father   . Depression Father   . Post-traumatic stress disorder Father   . Kidney failure  Father   . COPD Father   . Diabetes Father   . Hypertension Father   . Sleep apnea Father   . Depression Brother   . Diabetes Brother   . Hypertension Brother   . Sleep apnea Brother   . Breast cancer Paternal Aunt   . Bladder Cancer Neg Hx   . Prostate cancer Neg Hx     Social History Social History   Tobacco Use  . Smoking status: Never Smoker  . Smokeless tobacco: Never Used  Substance Use Topics  . Alcohol use: No    Alcohol/week: 0.0 oz  . Drug use: No     Review of Systems  Constitutional: No fever/chills. Cardiovascular: No chest pain. Gastrointestinal: No abdominal pain.  No nausea, no vomiting.  Musculoskeletal: Positive for rib pain.  Skin: Negative for rash, abrasions, lacerations, ecchymosis.   ____________________________________________   PHYSICAL EXAM:  VITAL SIGNS: ED Triage Vitals [02/03/18 1201]  Enc Vitals Group     BP 134/81     Pulse Rate 85     Resp 16     Temp 98.3 F (36.8 C)     Temp Source Oral     SpO2 96 %     Weight (!) 380 lb (172.4 kg)     Height      Head Circumference      Peak Flow      Pain Score 7     Pain Loc      Pain Edu?      Excl. in [place]?      Constitutional: Alert and oriented. Well appearing and in no acute distress. Eyes: Conjunctivae are normal. PERRL. EOMI. Head: Atraumatic. ENT:      Ears:      Nose: No congestion/rhinnorhea.      Mouth/Throat: Mucous membranes are moist.  Neck: No stridor.  Cardiovascular: Normal rate, regular rhythm.  Good peripheral circulation. Respiratory: Normal respiratory effort without tachypnea or retractions. Lungs CTAB. Good air entry to the bases with no decreased or absent breath sounds. Musculoskeletal: Full range of motion to all extremities. No gross deformities appreciated. Tenderness to palpation under left breast. Neurologic:  Normal speech and language. No gross  focal neurologic deficits are appreciated.  Skin:  Skin is warm, dry and intact. No rash  noted. Psychiatric: Mood and affect are normal. Speech and behavior are normal. Patient exhibits appropriate insight and judgement.   ____________________________________________   LABS (all labs ordered are listed, but only abnormal results are displayed)  Labs Reviewed - No data to display ____________________________________________  EKG   ____________________________________________  RADIOLOGY Robinette Haines, personally viewed and evaluated these images (plain radiographs) as part of my medical decision making, as well as reviewing the written report by the radiologist.  Dg Ribs Unilateral W/chest Left  Result Date: 02/03/2018 CLINICAL DATA:  [place] yesterday and hit the left side of her rib cage on the arm of the couch. BB marks area of interest. Pain is under her left breast area. EXAM: LEFT RIBS AND CHEST - 3+ VIEW COMPARISON:  None. FINDINGS: Acute nondisplaced fracture of left anterolateral eighth rib. There is no evidence of pneumothorax or pleural effusion. Both lungs are clear. Heart size and mediastinal contours are within normal limits. IMPRESSION: Acute nondisplaced fracture of left anterolateral eighth rib. Electronically Signed   By: Kathreen Devoid   On: 02/03/2018 13:22    ____________________________________________    PROCEDURES  Procedure(s) performed:    Procedures    Medications  ketorolac (TORADOL) 30 MG/ML injection 30 mg (30 mg Intramuscular Given 02/03/18 1421)     ____________________________________________   INITIAL IMPRESSION / ASSESSMENT AND PLAN / ED COURSE  Pertinent labs & imaging results that were available during my care of the patient were reviewed by me and considered in my medical decision making (see chart for details).  Review of the [place] CSRS was performed in accordance of the [place] prior to dispensing any controlled drugs.    Patient's diagnosis is consistent with non displaced rib fracture. Vital signs and exam are  reassuring. Patient will be discharged home with prescriptions for toradol. Patient is to follow up with PCP as directed. Patient is given ED precautions to return to the ED for any worsening or new symptoms.     ____________________________________________  FINAL CLINICAL IMPRESSION(S) / ED DIAGNOSES  Final diagnoses:  Closed fracture of one rib of left side, initial encounter      NEW MEDICATIONS STARTED DURING THIS VISIT:  ED Discharge Orders        Ordered    ketorolac (TORADOL) 10 MG tablet  Every 6 hours PRN     02/03/18 1429          This chart was dictated using voice recognition software/Dragon. Despite best efforts to proofread, errors can occur which can change the meaning. Any change was purely unintentional.    Laban Emperor, PA-C 02/03/18 1630    Nena Polio, MD 02/03/18 [phone]

## 2018-02-03 NOTE — ED Triage Notes (Signed)
Pt to ED via POV stating that she fell yesterday and hit the left side of her rib cage on the arm of the couch. Pt states that she has tried heat, ice, and OTC pain medication without relief. Pt is in NAD at this time.

## 2018-02-05 LAB — TOXASSURE SELECT 13 (MW), URINE

## 2018-02-15 DIAGNOSIS — E119 Type 2 diabetes mellitus without complications: Secondary | ICD-10-CM | POA: Diagnosis not present

## 2018-02-15 DIAGNOSIS — M150 Primary generalized (osteo)arthritis: Secondary | ICD-10-CM | POA: Diagnosis not present

## 2018-02-15 DIAGNOSIS — F329 Major depressive disorder, single episode, unspecified: Secondary | ICD-10-CM | POA: Diagnosis not present

## 2018-02-15 DIAGNOSIS — Z9884 Bariatric surgery status: Secondary | ICD-10-CM | POA: Insufficient documentation

## 2018-02-15 DIAGNOSIS — G43B1 Ophthalmoplegic migraine, intractable: Secondary | ICD-10-CM | POA: Diagnosis not present

## 2018-02-15 DIAGNOSIS — R0602 Shortness of breath: Secondary | ICD-10-CM | POA: Diagnosis not present

## 2018-02-15 DIAGNOSIS — I10 Essential (primary) hypertension: Secondary | ICD-10-CM | POA: Diagnosis not present

## 2018-02-15 DIAGNOSIS — Z713 Dietary counseling and surveillance: Secondary | ICD-10-CM | POA: Diagnosis not present

## 2018-02-21 ENCOUNTER — Ambulatory Visit: Payer: Medicare HMO | Admitting: Pain Medicine

## 2018-02-21 DIAGNOSIS — M47817 Spondylosis without myelopathy or radiculopathy, lumbosacral region: Secondary | ICD-10-CM | POA: Insufficient documentation

## 2018-02-21 DIAGNOSIS — Z0120 Encounter for dental examination and cleaning without abnormal findings: Secondary | ICD-10-CM | POA: Insufficient documentation

## 2018-02-21 NOTE — Progress Notes (Deleted)
Patient's Name: Karen Lewis  MRN: [id number]  Referring Provider: Vevelyn Francois, NP  DOB: Dec 09, 1970  PCP: Idelle Crouch, MD  DOS: 02/21/2018  Note by: Gaspar Cola, MD  Service setting: Ambulatory outpatient  Specialty: Interventional Pain Management  Patient type: Established  Location: [facility] (AMB) Pain Management Facility  Visit type: Interventional Procedure   Primary Reason for Visit: Interventional Pain Management Treatment. CC: No chief complaint on file.  Procedure:       Anesthesia, Analgesia, Anxiolysis:  Type: Lumbar Facet, Medial Branch Block(s)          Primary Purpose: Diagnostic Region: Posterolateral Lumbosacral Spine Level: L3, & L4 Medial Branch Level(s). Injecting these levels blocks the L4-5 lumbar facet joint. Laterality: Bilateral  Type: Moderate (Conscious) Sedation combined with Local Anesthesia Indication(s): Analgesia and Anxiety Route: Intravenous (IV) IV Access: Secured Sedation: Meaningful verbal contact was maintained at all times during the procedure  Local Anesthetic: Lidocaine 1-2%   Indications: 1. Lumbar facet syndrome (Bilateral) (R>L)   2. Spondylosis without myelopathy or radiculopathy, lumbosacral region   3. Grade 1 (1.4 cm) Anterolisthesis of L4 over L5   4. Chronic low back pain [facility] source of pain) (Bilateral) (R>L)    Pain Score: Pre-procedure:  /10 Post-procedure:  /10  Pre-op Assessment:  Karen Lewis is a 47 y.o. (year old), female patient, seen today for interventional treatment. She  has a past surgical history that includes Laparoscopic partial gastrectomy; Shoulder arthroscopy (Right); Joint replacement (Left, hip); Carpal tunnel release (Bilateral); Diagnostic laparoscopy; Cholecystectomy; Trigger finger release (Right); Thyroidectomy (N/A, 11/12/2015); left trigger finger; Roux-en-Y Gastric Bypass (06/03/2017); and Hiatal hernia repair. Karen Lewis has a current medication list which includes the following prescription(s):  acetaminophen, acyclovir, albuterol, amitriptyline, accu-chek aviva, accu-chek aviva plus, butalbital-acetaminophen-caffeine, diphenhydramine, estradiol, famotidine, gabapentin, guaifenesin-codeine, ipratropium-albuterol, ketoconazole, ketorolac, simple diagnostics lancing dev, levocetirizine, levothyroxine, levothyroxine, magnesium oxide, meclizine, medroxyprogesterone, metformin, metoprolol tartrate, montelukast, multivitamin with minerals, naloxone, nitrofurantoin (macrocrystal-monohydrate), omeprazole, oxycodone, oxycodone, oxycodone, phentermine, pioglitazone, quetiapine, ranitidine, ranitidine, rosuvastatin, sertraline, spironolactone, tizanidine, triamcinolone, triamcinolone, true metrix blood glucose test, ulticare alcohol swabs, and zonisamide. Her primarily concern today is the No chief complaint on file.  Initial Vital Signs:  Pulse/HCG Rate:    Temp:   Resp:   BP:   SpO2:    BMI: Estimated body mass index is 65.23 kg/m as calculated from the following:   Height as of 01/30/18: 5\' 4"  (1.626 m).   Weight as of 02/03/18: 380 lb (172.4 kg).  Risk Assessment: Allergies: Reviewed. She is allergic to bactrim [sulfamethoxazole-trimethoprim]; omalizumab; ciprofloxacin; shellfish allergy; aspirin; clindamycin; motrin [ibuprofen]; nsaids; other; and sulfa antibiotics.  Allergy Precautions: No iodine containing solutions or radiological contrast used. Coagulopathies: Reviewed. None identified.  Blood-thinner therapy: None at this time Active Infection(s): Reviewed. None identified. Karen Lewis is afebrile  Site Confirmation: Karen Lewis was asked to confirm the procedure and laterality before marking the site Procedure checklist: Completed Consent: Before the procedure and under the influence of no sedative(s), amnesic(s), or anxiolytics, the patient was informed of the treatment options, risks and possible complications. To fulfill our ethical and legal obligations, as recommended by the American  Medical Association's Code of Ethics, I have informed the patient of my clinical impression; the nature and purpose of the treatment or procedure; the risks, benefits, and possible complications of the intervention; the alternatives, including doing nothing; the risk(s) and benefit(s) of the alternative treatment(s) or procedure(s); and the risk(s) and benefit(s) of doing nothing. The patient was provided information  about the general risks and possible complications associated with the procedure. These may include, but are not limited to: failure to achieve desired goals, infection, bleeding, organ or nerve damage, allergic reactions, paralysis, and death. In addition, the patient was informed of those risks and complications associated to Spine-related procedures, such as failure to decrease pain; infection (i.e.: Meningitis, epidural or intraspinal abscess); bleeding (i.e.: epidural hematoma, subarachnoid hemorrhage, or any other type of intraspinal or peri-dural bleeding); organ or nerve damage (i.e.: Any type of peripheral nerve, nerve root, or spinal cord injury) with subsequent damage to sensory, motor, and/or autonomic systems, resulting in permanent pain, numbness, and/or weakness of one or several areas of the body; allergic reactions; (i.e.: anaphylactic reaction); and/or death. Furthermore, the patient was informed of those risks and complications associated with the medications. These include, but are not limited to: allergic reactions (i.e.: anaphylactic or anaphylactoid reaction(s)); adrenal axis suppression; blood sugar elevation that in diabetics may result in ketoacidosis or comma; water retention that in patients with history of congestive heart failure may result in shortness of breath, pulmonary edema, and decompensation with resultant heart failure; weight gain; swelling or edema; medication-induced neural toxicity; particulate matter embolism and blood vessel occlusion with resultant organ,  and/or nervous system infarction; and/or aseptic necrosis of one or more joints. Finally, the patient was informed that Medicine is not an exact science; therefore, there is also the possibility of unforeseen or unpredictable risks and/or possible complications that may result in a catastrophic outcome. The patient indicated having understood very clearly. We have given the patient no guarantees and we have made no promises. Enough time was given to the patient to ask questions, all of which were answered to the patient's satisfaction. Karen Lewis has indicated that she wanted to continue with the procedure. Attestation: I, the ordering provider, attest that I have discussed with the patient the benefits, risks, side-effects, alternatives, likelihood of achieving goals, and potential problems during recovery for the procedure that I have provided informed consent. Date  Time: {CHL [facility]-PAIN TIME CHOICES:[id number]}  Pre-Procedure Preparation:  Monitoring: As per clinic protocol. Respiration, ETCO2, SpO2, BP, heart rate and rhythm monitor placed and checked for adequate function Safety Precautions: Patient was assessed for positional comfort and pressure points before starting the procedure. Time-out: I initiated and conducted the "Time-out" before starting the procedure, as per protocol. The patient was asked to participate by confirming the accuracy of the "Time Out" information. Verification of the correct person, site, and procedure were performed and confirmed by me, the nursing staff, and the patient. "Time-out" conducted as per Joint Commission's Universal Protocol (UP.01.01.01). Time:    Description of Procedure:       Position: Prone Laterality: Bilateral. The procedure was performed in identical fashion on both sides. Levels:  L3, & L4 Medial Branch Level(s) Area Prepped: Posterior Lumbosacral Region Prepping solution: ChloraPrep (2% chlorhexidine gluconate and 70% isopropyl alcohol) Safety  Precautions: Aspiration looking for blood return was conducted prior to all injections. At no point did we inject any substances, as a needle was being advanced. Before injecting, the patient was told to immediately notify me if she was experiencing any new onset of "ringing in the ears, or metallic taste in the mouth". No attempts were made at seeking any paresthesias. Safe injection practices and needle disposal techniques used. Medications properly checked for expiration dates. SDV (single dose vial) medications used. After the completion of the procedure, all disposable equipment used was discarded in the proper designated medical waste  containers. Local Anesthesia: Protocol guidelines were followed. The patient was positioned over the fluoroscopy table. The area was prepped in the usual manner. The time-out was completed. The target area was identified using fluoroscopy. A 12-in long, straight, sterile hemostat was used with fluoroscopic guidance to locate the targets for each level blocked. Once located, the skin was marked with an approved surgical skin marker. Once all sites were marked, the skin (epidermis, dermis, and hypodermis), as well as deeper tissues (fat, connective tissue and muscle) were infiltrated with a small amount of a short-acting local anesthetic, loaded on a 10cc syringe with a 25G, 1.5-in  Needle. An appropriate amount of time was allowed for local anesthetics to take effect before proceeding to the next step. Local Anesthetic: Lidocaine 2.0% The unused portion of the local anesthetic was discarded in the proper designated containers. Technical explanation of process:  L3 Medial Branch Nerve Block (MBB): The target area for the L3 medial branch is at the junction of the postero-lateral aspect of the superior articular process and the superior, posterior, and medial edge of the transverse process of L4. Under fluoroscopic guidance, a Quincke needle was inserted until contact was made  with os over the superior postero-lateral aspect of the pedicular shadow (target area). After negative aspiration for blood, 0.5 mL of the nerve block solution was injected without difficulty or complication. The needle was removed intact. L4 Medial Branch Nerve Block (MBB): The target area for the L4 medial branch is at the junction of the postero-lateral aspect of the superior articular process and the superior, posterior, and medial edge of the transverse process of L5. Under fluoroscopic guidance, a Quincke needle was inserted until contact was made with os over the superior postero-lateral aspect of the pedicular shadow (target area). After negative aspiration for blood, 0.5 mL of the nerve block solution was injected without difficulty or complication. The needle was removed intact.Procedural Needles: 22-gauge, 3.5-inch, Quincke needles used for all levels. Nerve block solution: 0.2% PF-Ropivacaine + Triamcinolone (40 mg/mL) diluted to a final concentration of 4 mg of Triamcinolone/mL of Ropivacaine The unused portion of the solution was discarded in the proper designated containers.  Once the entire procedure was completed, the treated area was cleaned, making sure to leave some of the prepping solution back to take advantage of its long term bactericidal properties.   Illustration of the posterior view of the lumbar spine and the posterior neural structures. Laminae of L2 through S1 are labeled. DPRL5, dorsal primary ramus of L5; DPRS1, dorsal primary ramus of S1; DPR3, dorsal primary ramus of L3; FJ, facet (zygapophyseal) joint L3-L4; I, inferior articular process of L4; LB1, lateral branch of dorsal primary ramus of L1; IAB, inferior articular branches from L3 medial branch (supplies L4-L5 facet joint); IBP, intermediate branch plexus; MB3, medial branch of dorsal primary ramus of L3; NR3, third lumbar nerve root; S, superior articular process of L5; SAB, superior articular branches from L4  (supplies L4-5 facet joint also); TP3, transverse process of L3.  There were no vitals filed for this visit.  Start Time:   hrs. End Time:   hrs.  Imaging Guidance (Spinal):  Type of Imaging Technique: Fluoroscopy Guidance (Spinal) Indication(s): Assistance in needle guidance and placement for procedures requiring needle placement in or near specific anatomical locations not easily accessible without such assistance. Exposure Time: Please see nurses notes. Contrast: None used. Fluoroscopic Guidance: I was personally present during the use of fluoroscopy. "Tunnel Vision Technique" used to obtain the best possible  view of the target area. Parallax error corrected before commencing the procedure. "Direction-depth-direction" technique used to introduce the needle under continuous pulsed fluoroscopy. Once target was reached, antero-posterior, oblique, and lateral fluoroscopic projection used confirm needle placement in all planes. Images permanently stored in EMR. Interpretation: No contrast injected. I personally interpreted the imaging intraoperatively. Adequate needle placement confirmed in multiple planes. Permanent images saved into the patient's record.  Antibiotic Prophylaxis:   Anti-infectives (From admission, onward)   None     Indication(s): None identified  Post-operative Assessment:  Post-procedure Vital Signs:  Pulse/HCG Rate:    Temp:   Resp:   BP:   SpO2:    EBL: None  Complications: No immediate post-treatment complications observed by team, or reported by patient.  Note: The patient tolerated the entire procedure well. A repeat set of vitals were taken after the procedure and the patient was kept under observation following institutional policy, for this type of procedure. Post-procedural neurological assessment was performed, showing return to baseline, prior to discharge. The patient was provided with post-procedure discharge instructions, including a section on how to  identify potential problems. Should any problems arise concerning this procedure, the patient was given instructions to immediately contact us, at any time, without hesitation. In any case, we plan to contact the patient by telephone for a follow-up status report regarding this interventional procedure.  Comments:  No additional relevant information.  Plan of Care   Imaging Orders  No imaging studies ordered today   Procedure Orders    No procedure(s) ordered today    Medications ordered for procedure: No orders of the defined types were placed in this encounter.  Medications administered: Karen Lewis had no medications administered during this visit.  See the medical record for exact dosing, route, and time of administration.  New Prescriptions   No medications on file   Disposition: Discharge home  Discharge Date & Time: 02/21/2018;   hrs.   Physician-requested Follow-up: No follow-ups on file.  Future Appointments  Date Time Provider [place]  02/21/2018  9:45 AM Milinda Pointer, MD [facility] None  05/01/2018 11:00 AM Vevelyn Francois, NP [facility] None   Primary Care Physician: Idelle Crouch, MD Location: [facility] Facility Note by: Gaspar Cola, MD Date: 02/21/2018; Time: 6:57 AM  Disclaimer:  Medicine is not an Chief Strategy Officer. The only guarantee in medicine is that nothing is guaranteed. It is important to note that the decision to proceed with this intervention was based on the information collected from the patient. The Data and conclusions were drawn from the patient's questionnaire, the interview, and the physical examination. Because the information was provided in large part by the patient, it cannot be guaranteed that it has not been purposely or unconsciously manipulated. Every effort has been made to obtain as much relevant data as possible for this evaluation. It is important to note that the conclusions that lead to this  procedure are derived in large part from the available data. Always take into account that the treatment will also be dependent on availability of resources and existing treatment guidelines, considered by other Pain Management Practitioners as being common knowledge and practice, at the time of the intervention. For Medico-Legal purposes, it is also important to point out that variation in procedural techniques and pharmacological choices are the acceptable norm. The indications, contraindications, technique, and results of the above procedure should only be interpreted and judged by a Board-Certified Interventional Pain Specialist with extensive familiarity  and expertise in the same exact procedure and technique.

## 2018-03-01 ENCOUNTER — Ambulatory Visit
Admission: RE | Admit: 2018-03-01 | Discharge: 2018-03-01 | Disposition: A | Discharge status: Home / Self Care | Payer: Medicare HMO | Source: Ambulatory Visit | Attending: Nurse Practitioner | Admitting: Nurse Practitioner

## 2018-03-01 DIAGNOSIS — M25512 Pain in left shoulder: Secondary | ICD-10-CM | POA: Insufficient documentation

## 2018-03-01 DIAGNOSIS — M4316 Spondylolisthesis, lumbar region: Secondary | ICD-10-CM | POA: Insufficient documentation

## 2018-03-01 DIAGNOSIS — M5136 Other intervertebral disc degeneration, lumbar region: Secondary | ICD-10-CM | POA: Insufficient documentation

## 2018-03-01 DIAGNOSIS — M47816 Spondylosis without myelopathy or radiculopathy, lumbar region: Secondary | ICD-10-CM

## 2018-03-01 DIAGNOSIS — G8929 Other chronic pain: Secondary | ICD-10-CM

## 2018-03-01 DIAGNOSIS — M19012 Primary osteoarthritis, left shoulder: Secondary | ICD-10-CM | POA: Insufficient documentation

## 2018-03-02 DIAGNOSIS — R079 Chest pain, unspecified: Secondary | ICD-10-CM | POA: Diagnosis not present

## 2018-03-02 DIAGNOSIS — R071 Chest pain on breathing: Secondary | ICD-10-CM | POA: Diagnosis not present

## 2018-03-02 DIAGNOSIS — Z8781 Personal history of (healed) traumatic fracture: Secondary | ICD-10-CM | POA: Diagnosis not present

## 2018-03-07 ENCOUNTER — Other Ambulatory Visit: Payer: Self-pay

## 2018-03-07 ENCOUNTER — Ambulatory Visit (HOSPITAL_BASED_OUTPATIENT_CLINIC_OR_DEPARTMENT_OTHER): Payer: Medicare HMO | Admitting: Pain Medicine

## 2018-03-07 ENCOUNTER — Encounter: Payer: Self-pay | Admitting: Pain Medicine

## 2018-03-07 ENCOUNTER — Ambulatory Visit
Admission: RE | Admit: 2018-03-07 | Discharge: 2018-03-07 | Disposition: A | Discharge status: Home / Self Care | Payer: Medicare HMO | Source: Ambulatory Visit | Attending: Pain Medicine | Admitting: Pain Medicine

## 2018-03-07 VITALS — BP 113/93 | HR 79 | Temp 97.2°F | Resp 21 | Ht 64.0 in | Wt 383.4 lb

## 2018-03-07 DIAGNOSIS — Z903 Acquired absence of stomach [part of]: Secondary | ICD-10-CM | POA: Diagnosis not present

## 2018-03-07 DIAGNOSIS — Z96642 Presence of left artificial hip joint: Secondary | ICD-10-CM | POA: Diagnosis not present

## 2018-03-07 DIAGNOSIS — Z6841 Body Mass Index (BMI) 40.0 and over, adult: Secondary | ICD-10-CM

## 2018-03-07 DIAGNOSIS — Z881 Allergy status to other antibiotic agents status: Secondary | ICD-10-CM | POA: Insufficient documentation

## 2018-03-07 DIAGNOSIS — Z91013 Allergy to seafood: Secondary | ICD-10-CM | POA: Insufficient documentation

## 2018-03-07 DIAGNOSIS — M4316 Spondylolisthesis, lumbar region: Secondary | ICD-10-CM | POA: Diagnosis not present

## 2018-03-07 DIAGNOSIS — Z882 Allergy status to sulfonamides status: Secondary | ICD-10-CM | POA: Insufficient documentation

## 2018-03-07 DIAGNOSIS — F419 Anxiety disorder, unspecified: Secondary | ICD-10-CM | POA: Diagnosis not present

## 2018-03-07 DIAGNOSIS — Z79899 Other long term (current) drug therapy: Secondary | ICD-10-CM | POA: Insufficient documentation

## 2018-03-07 DIAGNOSIS — E890 Postprocedural hypothyroidism: Secondary | ICD-10-CM | POA: Diagnosis not present

## 2018-03-07 DIAGNOSIS — Z9049 Acquired absence of other specified parts of digestive tract: Secondary | ICD-10-CM | POA: Diagnosis not present

## 2018-03-07 DIAGNOSIS — Z9884 Bariatric surgery status: Secondary | ICD-10-CM | POA: Diagnosis not present

## 2018-03-07 DIAGNOSIS — Z7984 Long term (current) use of oral hypoglycemic drugs: Secondary | ICD-10-CM | POA: Diagnosis not present

## 2018-03-07 DIAGNOSIS — M545 Low back pain, unspecified: Secondary | ICD-10-CM

## 2018-03-07 DIAGNOSIS — M47817 Spondylosis without myelopathy or radiculopathy, lumbosacral region: Secondary | ICD-10-CM

## 2018-03-07 DIAGNOSIS — M4310 Spondylolisthesis, site unspecified: Secondary | ICD-10-CM

## 2018-03-07 DIAGNOSIS — M47816 Spondylosis without myelopathy or radiculopathy, lumbar region: Secondary | ICD-10-CM

## 2018-03-07 DIAGNOSIS — G8929 Other chronic pain: Secondary | ICD-10-CM | POA: Diagnosis not present

## 2018-03-07 DIAGNOSIS — Z886 Allergy status to analgesic agent status: Secondary | ICD-10-CM | POA: Diagnosis not present

## 2018-03-07 DIAGNOSIS — Z79890 Hormone replacement therapy (postmenopausal): Secondary | ICD-10-CM | POA: Diagnosis not present

## 2018-03-07 DIAGNOSIS — Z79891 Long term (current) use of opiate analgesic: Secondary | ICD-10-CM | POA: Diagnosis not present

## 2018-03-07 MED ORDER — FENTANYL CITRATE (PF) 100 MCG/2ML IJ SOLN
25.0000 ug | INTRAMUSCULAR | Status: DC | PRN
  Administered 2018-03-07: 100 ug via INTRAVENOUS
  Filled 2018-03-07: qty 2

## 2018-03-07 MED ORDER — LACTATED RINGERS IV SOLN
1000.0000 mL | Freq: Once | INTRAVENOUS | Status: AC
  Administered 2018-03-07: 1000 mL via INTRAVENOUS

## 2018-03-07 MED ORDER — ROPIVACAINE HCL 2 MG/ML IJ SOLN
18.0000 mL | Freq: Once | INTRAMUSCULAR | Status: AC
  Administered 2018-03-07: 18 mL via PERINEURAL
  Filled 2018-03-07: qty 20

## 2018-03-07 MED ORDER — LIDOCAINE HCL 2 % IJ SOLN
20.0000 mL | Freq: Once | INTRAMUSCULAR | Status: AC
  Administered 2018-03-07: 400 mg
  Filled 2018-03-07: qty 40

## 2018-03-07 MED ORDER — TRIAMCINOLONE ACETONIDE 40 MG/ML IJ SUSP
80.0000 mg | Freq: Once | INTRAMUSCULAR | Status: AC
  Administered 2018-03-07: 80 mg
  Filled 2018-03-07: qty 2

## 2018-03-07 MED ORDER — MIDAZOLAM HCL 5 MG/5ML IJ SOLN
1.0000 mg | INTRAMUSCULAR | Status: DC | PRN
  Administered 2018-03-07: 3 mg via INTRAVENOUS
  Filled 2018-03-07: qty 5

## 2018-03-07 NOTE — Progress Notes (Signed)
Patient's Name: Karen Lewis  MRN: [id number]  Referring Provider: Idelle Crouch, MD  DOB: 15-Aug-1971  PCP: Idelle Crouch, MD  DOS: 03/07/2018  Note by: Gaspar Cola, MD  Service setting: Ambulatory outpatient  Specialty: Interventional Pain Management  Patient type: Established  Location: [facility] (AMB) Pain Management Facility  Visit type: Interventional Procedure   Primary Reason for Visit: Interventional Pain Management Treatment. CC: Back Pain (lower)  Procedure:          Anesthesia, Analgesia, Anxiolysis:  Type: Lumbar Facet, Medial Branch Block(s)          Primary Purpose: Palliative Region: Posterolateral Lumbosacral Spine Level: L2, L3, L4, L5, & S1 Medial Branch Level(s). Injecting these levels blocks the L3-4, L4-5, and L5-S1 lumbar facet joints. Laterality: Bilateral  Type: Moderate (Conscious) Sedation combined with Local Anesthesia Indication(s): Analgesia and Anxiety Route: Intravenous (IV) IV Access: Secured Sedation: Meaningful verbal contact was maintained at all times during the procedure  Local Anesthetic: Lidocaine 1-2%   Indications: 1. Spondylosis without myelopathy or radiculopathy, lumbosacral region   2. Lumbar facet syndrome (Bilateral) (R>L)   3. Grade 1 (1.4 cm) Anterolisthesis of L4 over L5   4. Chronic low back pain [facility] source of pain) (Bilateral) (R>L)   5. Morbid obesity with body mass index (BMI) greater than or equal to 70 in adult [facility])    Pain Score: Pre-procedure: 4 /10 Post-procedure: 0-No pain/10  Pre-op Assessment:  Karen Lewis is a 47 y.o. (year old), female patient, seen today for interventional treatment. She  has a past surgical history that includes Laparoscopic partial gastrectomy; Shoulder arthroscopy (Right); Joint replacement (Left, hip); Carpal tunnel release (Bilateral); Diagnostic laparoscopy; Cholecystectomy; Trigger finger release (Right); Thyroidectomy (N/A, 11/12/2015); left trigger finger; Roux-en-Y Gastric Bypass  (06/03/2017); and Hiatal hernia repair. [name] has a current medication list which includes the following prescription(s): acetaminophen, acyclovir, albuterol, amitriptyline, accu-chek aviva, accu-chek aviva plus, butalbital-acetaminophen-caffeine, diphenhydramine, estradiol, famotidine, gabapentin, guaifenesin-codeine, ipratropium-albuterol, ketoconazole, ketorolac, simple diagnostics lancing dev, levocetirizine, levothyroxine, magnesium oxide, meclizine, medroxyprogesterone, metformin, metoprolol tartrate, montelukast, multivitamin with minerals, naloxone, nitrofurantoin (macrocrystal-monohydrate), omeprazole, oxycodone, pioglitazone, quetiapine, ranitidine, rosuvastatin, sertraline, spironolactone, tizanidine, triamcinolone, triamcinolone, true metrix blood glucose test, ulticare alcohol swabs, zonisamide, levothyroxine, oxycodone, oxycodone, phentermine, and ranitidine, and the following Facility-Administered Medications: fentanyl and midazolam. Her primarily concern today is the Back Pain (lower)  Initial Vital Signs:  Pulse/HCG Rate: 79ECG Heart Rate: 87 Temp: 98.3 F (36.8 C) Resp: 16 BP: 130/84 SpO2: 99 %  BMI: Estimated body mass index is 65.81 kg/m as calculated from the following:   Height as of this encounter: 5\' 4"  (1.626 m).   Weight as of this encounter: 383 lb 6.4 oz (173.9 kg).  Risk Assessment: Allergies: Reviewed. She is allergic to bactrim [sulfamethoxazole-trimethoprim]; omalizumab; ciprofloxacin; shellfish allergy; aspirin; clindamycin; motrin [ibuprofen]; nsaids; other; and sulfa antibiotics.  Allergy Precautions: No iodine containing solutions or radiological contrast used. Coagulopathies: Reviewed. None identified.  Blood-thinner therapy: None at this time Active Infection(s): Reviewed. None identified. [name] is afebrile  Site Confirmation: Ms. Wiers was asked to confirm the procedure and laterality before marking the site Procedure checklist: Completed Consent:  Before the procedure and under the influence of no sedative(s), amnesic(s), or anxiolytics, the patient was informed of the treatment options, risks and possible complications. To fulfill our ethical and legal obligations, as recommended by the American Medical Association's Code of Ethics, I have informed the patient of my clinical impression; the nature and purpose of the treatment or procedure;  the risks, benefits, and possible complications of the intervention; the alternatives, including doing nothing; the risk(s) and benefit(s) of the alternative treatment(s) or procedure(s); and the risk(s) and benefit(s) of doing nothing. The patient was provided information about the general risks and possible complications associated with the procedure. These may include, but are not limited to: failure to achieve desired goals, infection, bleeding, organ or nerve damage, allergic reactions, paralysis, and death. In addition, the patient was informed of those risks and complications associated to Spine-related procedures, such as failure to decrease pain; infection (i.e.: Meningitis, epidural or intraspinal abscess); bleeding (i.e.: epidural hematoma, subarachnoid hemorrhage, or any other type of intraspinal or peri-dural bleeding); organ or nerve damage (i.e.: Any type of peripheral nerve, nerve root, or spinal cord injury) with subsequent damage to sensory, motor, and/or autonomic systems, resulting in permanent pain, numbness, and/or weakness of one or several areas of the body; allergic reactions; (i.e.: anaphylactic reaction); and/or death. Furthermore, the patient was informed of those risks and complications associated with the medications. These include, but are not limited to: allergic reactions (i.e.: anaphylactic or anaphylactoid reaction(s)); adrenal axis suppression; blood sugar elevation that in diabetics may result in ketoacidosis or comma; water retention that in patients with history of congestive heart  failure may result in shortness of breath, pulmonary edema, and decompensation with resultant heart failure; weight gain; swelling or edema; medication-induced neural toxicity; particulate matter embolism and blood vessel occlusion with resultant organ, and/or nervous system infarction; and/or aseptic necrosis of one or more joints. Finally, the patient was informed that Medicine is not an exact science; therefore, there is also the possibility of unforeseen or unpredictable risks and/or possible complications that may result in a catastrophic outcome. The patient indicated having understood very clearly. We have given the patient no guarantees and we have made no promises. Enough time was given to the patient to ask questions, all of which were answered to the patient's satisfaction. Ms. Nolton has indicated that she wanted to continue with the procedure. Attestation: I, the ordering provider, attest that I have discussed with the patient the benefits, risks, side-effects, alternatives, likelihood of achieving goals, and potential problems during recovery for the procedure that I have provided informed consent. Date  Time: 03/07/2018  9:35 AM  Pre-Procedure Preparation:  Monitoring: As per clinic protocol. Respiration, ETCO2, SpO2, BP, heart rate and rhythm monitor placed and checked for adequate function Safety Precautions: Patient was assessed for positional comfort and pressure points before starting the procedure. Time-out: I initiated and conducted the "Time-out" before starting the procedure, as per protocol. The patient was asked to participate by confirming the accuracy of the "Time Out" information. Verification of the correct person, site, and procedure were performed and confirmed by me, the nursing staff, and the patient. "Time-out" conducted as per Joint Commission's Universal Protocol (UP.01.01.01). Time: 1109  Description of Procedure:          Position: Prone Laterality: Bilateral. The  procedure was performed in identical fashion on both sides. Levels:  L2, L3, L4, L5, & S1 Medial Branch Level(s) Area Prepped: Posterior Lumbosacral Region Prepping solution: ChloraPrep (2% chlorhexidine gluconate and 70% isopropyl alcohol) Safety Precautions: Aspiration looking for blood return was conducted prior to all injections. At no point did we inject any substances, as a needle was being advanced. Before injecting, the patient was told to immediately notify me if she was experiencing any new onset of "ringing in the ears, or metallic taste in the mouth". No attempts were made  at seeking any paresthesias. Safe injection practices and needle disposal techniques used. Medications properly checked for expiration dates. SDV (single dose vial) medications used. After the completion of the procedure, all disposable equipment used was discarded in the proper designated medical waste containers. Local Anesthesia: Protocol guidelines were followed. The patient was positioned over the fluoroscopy table. The area was prepped in the usual manner. The time-out was completed. The target area was identified using fluoroscopy. A 12-in long, straight, sterile hemostat was used with fluoroscopic guidance to locate the targets for each level blocked. Once located, the skin was marked with an approved surgical skin marker. Once all sites were marked, the skin (epidermis, dermis, and hypodermis), as well as deeper tissues (fat, connective tissue and muscle) were infiltrated with a small amount of a short-acting local anesthetic, loaded on a 10cc syringe with a 25G, 1.5-in  Needle. An appropriate amount of time was allowed for local anesthetics to take effect before proceeding to the next step. Local Anesthetic: Lidocaine 2.0% The unused portion of the local anesthetic was discarded in the proper designated containers. Technical explanation of process:  L2 Medial Branch Nerve Block (MBB): The target area for the L2 medial  branch is at the junction of the postero-lateral aspect of the superior articular process and the superior, posterior, and medial edge of the transverse process of L3. Under fluoroscopic guidance, a Quincke needle was inserted until contact was made with os over the superior postero-lateral aspect of the pedicular shadow (target area). After negative aspiration for blood, 0.5 mL of the nerve block solution was injected without difficulty or complication. The needle was removed intact. L3 Medial Branch Nerve Block (MBB): The target area for the L3 medial branch is at the junction of the postero-lateral aspect of the superior articular process and the superior, posterior, and medial edge of the transverse process of L4. Under fluoroscopic guidance, a Quincke needle was inserted until contact was made with os over the superior postero-lateral aspect of the pedicular shadow (target area). After negative aspiration for blood, 0.5 mL of the nerve block solution was injected without difficulty or complication. The needle was removed intact. L4 Medial Branch Nerve Block (MBB): The target area for the L4 medial branch is at the junction of the postero-lateral aspect of the superior articular process and the superior, posterior, and medial edge of the transverse process of L5. Under fluoroscopic guidance, a Quincke needle was inserted until contact was made with os over the superior postero-lateral aspect of the pedicular shadow (target area). After negative aspiration for blood, 0.5 mL of the nerve block solution was injected without difficulty or complication. The needle was removed intact. L5 Medial Branch Nerve Block (MBB): The target area for the L5 medial branch is at the junction of the postero-lateral aspect of the superior articular process and the superior, posterior, and medial edge of the sacral ala. Under fluoroscopic guidance, a Quincke needle was inserted until contact was made with os over the superior  postero-lateral aspect of the pedicular shadow (target area). After negative aspiration for blood, 0.5 mL of the nerve block solution was injected without difficulty or complication. The needle was removed intact. S1 Medial Branch Nerve Block (MBB): The target area for the S1 medial branch is at the posterior and inferior 6 o'clock position of the L5-S1 facet joint. Under fluoroscopic guidance, the Quincke needle inserted for the L5 MBB was redirected until contact was made with os over the inferior and postero aspect of the sacrum,  at the 6 o' clock position under the L5-S1 facet joint (Target area). After negative aspiration for blood, 0.5 mL of the nerve block solution was injected without difficulty or complication. The needle was removed intact. Procedural Needles: 22-gauge, 7-inch, Quincke needles used for all levels. Nerve block solution: 0.2% PF-Ropivacaine + Triamcinolone (40 mg/mL) diluted to a final concentration of 4 mg of Triamcinolone/mL of Ropivacaine The unused portion of the solution was discarded in the proper designated containers.  Once the entire procedure was completed, the treated area was cleaned, making sure to leave some of the prepping solution back to take advantage of its long term bactericidal properties.   Illustration of the posterior view of the lumbar spine and the posterior neural structures. Laminae of L2 through S1 are labeled. DPRL5, dorsal primary ramus of L5; DPRS1, dorsal primary ramus of S1; DPR3, dorsal primary ramus of L3; FJ, facet (zygapophyseal) joint L3-L4; I, inferior articular process of L4; LB1, lateral branch of dorsal primary ramus of L1; IAB, inferior articular branches from L3 medial branch (supplies L4-L5 facet joint); IBP, intermediate branch plexus; MB3, medial branch of dorsal primary ramus of L3; NR3, third lumbar nerve root; S, superior articular process of L5; SAB, superior articular branches from L4 (supplies L4-5 facet joint also); TP3,  transverse process of L3.  Vitals:   03/07/18 1124 03/07/18 1131 03/07/18 1141 03/07/18 1150  BP: 136/88 (!) 70/57 128/75 (!) 113/93  Pulse:      Resp: 16 18 17  (!) 21  Temp:  (!) 97.3 F (36.3 C)  (!) 97.2 F (36.2 C)  TempSrc:  Temporal  Temporal  SpO2: 98% 96% 98% 100%  Weight:      Height:        Start Time: 1109 hrs. End Time: 1123 hrs.  Imaging Guidance (Spinal):          Type of Imaging Technique: Fluoroscopy Guidance (Spinal) Indication(s): Assistance in needle guidance and placement for procedures requiring needle placement in or near specific anatomical locations not easily accessible without such assistance. Exposure Time: Please see nurses notes. Contrast: None used. Fluoroscopic Guidance: I was personally present during the use of fluoroscopy. "Tunnel Vision Technique" used to obtain the best possible view of the target area. Parallax error corrected before commencing the procedure. "Direction-depth-direction" technique used to introduce the needle under continuous pulsed fluoroscopy. Once target was reached, antero-posterior, oblique, and lateral fluoroscopic projection used confirm needle placement in all planes. Images permanently stored in EMR. Interpretation: No contrast injected. I personally interpreted the imaging intraoperatively. Adequate needle placement confirmed in multiple planes. Permanent images saved into the patient's record.  Antibiotic Prophylaxis:   Anti-infectives (From admission, onward)   None     Indication(s): None identified  Post-operative Assessment:  Post-procedure Vital Signs:  Pulse/HCG Rate: 7980 Temp: (!) 97.2 F (36.2 C) Resp: (!) 21 BP: (!) 113/93 SpO2: 100 %  EBL: None  Complications: No immediate post-treatment complications observed by team, or reported by patient.  Note: The patient tolerated the entire procedure well. A repeat set of vitals were taken after the procedure and the patient was kept under observation  following institutional policy, for this type of procedure. Post-procedural neurological assessment was performed, showing return to baseline, prior to discharge. The patient was provided with post-procedure discharge instructions, including a section on how to identify potential problems. Should any problems arise concerning this procedure, the patient was given instructions to immediately contact us, at any time, without hesitation. In any case, we plan  to contact the patient by telephone for a follow-up status report regarding this interventional procedure.  Comments:  No additional relevant information.  Plan of Care   Possible POC:  Not an appropriate candidate for radiofrequency ablation secondary to body habitus.  No steroid injection any sooner than every 2 months, regardless of location.  Limited palliative series of 5 bilateral intra-articular Hyalgan knee injections. Not to be repeated any sooner than every 6-8 months  Limited palliative bilateral intra-articular knee joint injection with local anesthetic and steroid, not to be repeated more than once per year, in each knee.  Limited palliative left intra-articular shoulder joint injection with local anesthetic and steroid, not to be repeated more than once per year.  Limited palliative bilateral lumbar facet block, not to be repeated any sooner than every 2-3 months (No RFA)  Limited palliative right-sided intra-articular hip joint injection with local anesthetic and steroid, not to be repeated more than once per year.     Imaging Orders     DG C-Arm 1-60 Min-No Report  Procedure Orders     LUMBAR FACET(MEDIAL BRANCH NERVE BLOCK) MBNB  Medications ordered for procedure: Meds ordered this encounter  Medications  . lidocaine (XYLOCAINE) 2 % (with pres) injection 400 mg  . midazolam (VERSED) 5 MG/5ML injection 1-2 mg    Make sure Flumazenil is available in the pyxis when using this medication. If oversedation occurs, administer  0.2 mg IV over 15 sec. If after 45 sec no response, administer 0.2 mg again over 1 min; may repeat at 1 min intervals; not to exceed 4 doses (1 mg)  . fentaNYL (SUBLIMAZE) injection 25-50 mcg    Make sure Narcan is available in the pyxis when using this medication. In the event of respiratory depression (RR< 8/min): Titrate NARCAN (naloxone) in increments of 0.1 to 0.2 mg IV at 2-3 minute intervals, until desired degree of reversal.  . lactated ringers infusion 1,000 mL  . ropivacaine (PF) 2 mg/mL (0.2%) (NAROPIN) injection 18 mL  . triamcinolone acetonide (KENALOG-40) injection 80 mg   Medications administered: We administered lidocaine, midazolam, fentaNYL, lactated ringers, ropivacaine (PF) 2 mg/mL (0.2%), and triamcinolone acetonide.  See the medical record for exact dosing, route, and time of administration.  New Prescriptions   No medications on file   Disposition: Discharge home  Discharge Date & Time: 03/07/2018; 1159 hrs.   Physician-requested Follow-up: Return for post-procedure eval (2 wks), w/ Dionisio David, NP.  Future Appointments  Date Time Provider [place]  03/22/2018 11:15 AM Vevelyn Francois, NP [facility] None  05/01/2018 11:00 AM Vevelyn Francois, NP [facility] None   Primary Care Physician: Idelle Crouch, MD Location: [facility] Management Facility Note by: Gaspar Cola, MD Date: 03/07/2018; Time: 12:35 PM  Disclaimer:  Medicine is not an exact science. The only guarantee in medicine is that nothing is guaranteed. It is important to note that the decision to proceed with this intervention was based on the information collected from the patient. The Data and conclusions were drawn from the patient's questionnaire, the interview, and the physical examination. Because the information was provided in large part by the patient, it cannot be guaranteed that it has not been purposely or unconsciously manipulated. Every effort has been made to obtain  as much relevant data as possible for this evaluation. It is important to note that the conclusions that lead to this procedure are derived in large part from the available data. Always take into account that the  treatment will also be dependent on availability of resources and existing treatment guidelines, considered by other Pain Management Practitioners as being common knowledge and practice, at the time of the intervention. For Medico-Legal purposes, it is also important to point out that variation in procedural techniques and pharmacological choices are the acceptable norm. The indications, contraindications, technique, and results of the above procedure should only be interpreted and judged by a Board-Certified Interventional Pain Specialist with extensive familiarity and expertise in the same exact procedure and technique.

## 2018-03-07 NOTE — Patient Instructions (Signed)

## 2018-03-07 NOTE — Progress Notes (Signed)
Safety precautions to be maintained throughout the outpatient stay will include: orient to surroundings, keep bed in low position, maintain call bell within reach at all times, provide assistance with transfer out of bed and ambulation.  

## 2018-03-08 ENCOUNTER — Telehealth: Payer: Self-pay

## 2018-03-08 NOTE — Telephone Encounter (Signed)
No problems post procedure. 

## 2018-03-21 NOTE — Progress Notes (Signed)
Results were reviewed and found to be: mildly abnormal  No acute injury or pathology identified  Review would suggest interventional pain management techniques may be of benefit 

## 2018-03-21 NOTE — Progress Notes (Signed)
Results were reviewed and found to be: abnormal  No acute injury or pathology identified  Review would suggest interventional pain management techniques may be of benefit

## 2018-03-22 ENCOUNTER — Ambulatory Visit: Payer: Medicare HMO | Admitting: Nurse Practitioner

## 2018-03-27 ENCOUNTER — Encounter: Payer: Self-pay | Admitting: Nurse Practitioner

## 2018-03-27 ENCOUNTER — Ambulatory Visit: Payer: Medicare HMO | Attending: Nurse Practitioner | Admitting: Nurse Practitioner

## 2018-03-27 ENCOUNTER — Other Ambulatory Visit: Payer: Self-pay

## 2018-03-27 VITALS — BP 122/77 | HR 81 | Temp 98.9°F | Ht 64.0 in | Wt 383.0 lb

## 2018-03-27 DIAGNOSIS — Z7984 Long term (current) use of oral hypoglycemic drugs: Secondary | ICD-10-CM | POA: Insufficient documentation

## 2018-03-27 DIAGNOSIS — Z9884 Bariatric surgery status: Secondary | ICD-10-CM | POA: Insufficient documentation

## 2018-03-27 DIAGNOSIS — E119 Type 2 diabetes mellitus without complications: Secondary | ICD-10-CM | POA: Insufficient documentation

## 2018-03-27 DIAGNOSIS — F419 Anxiety disorder, unspecified: Secondary | ICD-10-CM | POA: Insufficient documentation

## 2018-03-27 DIAGNOSIS — Z882 Allergy status to sulfonamides status: Secondary | ICD-10-CM | POA: Insufficient documentation

## 2018-03-27 DIAGNOSIS — M109 Gout, unspecified: Secondary | ICD-10-CM | POA: Diagnosis not present

## 2018-03-27 DIAGNOSIS — M546 Pain in thoracic spine: Secondary | ICD-10-CM | POA: Insufficient documentation

## 2018-03-27 DIAGNOSIS — E039 Hypothyroidism, unspecified: Secondary | ICD-10-CM | POA: Insufficient documentation

## 2018-03-27 DIAGNOSIS — M19012 Primary osteoarthritis, left shoulder: Secondary | ICD-10-CM | POA: Insufficient documentation

## 2018-03-27 DIAGNOSIS — K449 Diaphragmatic hernia without obstruction or gangrene: Secondary | ICD-10-CM | POA: Insufficient documentation

## 2018-03-27 DIAGNOSIS — G894 Chronic pain syndrome: Secondary | ICD-10-CM | POA: Diagnosis not present

## 2018-03-27 DIAGNOSIS — M170 Bilateral primary osteoarthritis of knee: Secondary | ICD-10-CM | POA: Diagnosis not present

## 2018-03-27 DIAGNOSIS — M25512 Pain in left shoulder: Secondary | ICD-10-CM | POA: Diagnosis not present

## 2018-03-27 DIAGNOSIS — K219 Gastro-esophageal reflux disease without esophagitis: Secondary | ICD-10-CM | POA: Insufficient documentation

## 2018-03-27 DIAGNOSIS — Z96642 Presence of left artificial hip joint: Secondary | ICD-10-CM | POA: Diagnosis not present

## 2018-03-27 DIAGNOSIS — E785 Hyperlipidemia, unspecified: Secondary | ICD-10-CM | POA: Diagnosis not present

## 2018-03-27 DIAGNOSIS — M1611 Unilateral primary osteoarthritis, right hip: Secondary | ICD-10-CM

## 2018-03-27 DIAGNOSIS — M25562 Pain in left knee: Secondary | ICD-10-CM

## 2018-03-27 DIAGNOSIS — M25561 Pain in right knee: Secondary | ICD-10-CM | POA: Diagnosis not present

## 2018-03-27 DIAGNOSIS — K2970 Gastritis, unspecified, without bleeding: Secondary | ICD-10-CM | POA: Insufficient documentation

## 2018-03-27 DIAGNOSIS — I10 Essential (primary) hypertension: Secondary | ICD-10-CM | POA: Diagnosis not present

## 2018-03-27 DIAGNOSIS — G4700 Insomnia, unspecified: Secondary | ICD-10-CM | POA: Insufficient documentation

## 2018-03-27 DIAGNOSIS — Z79891 Long term (current) use of opiate analgesic: Secondary | ICD-10-CM | POA: Diagnosis not present

## 2018-03-27 DIAGNOSIS — M47816 Spondylosis without myelopathy or radiculopathy, lumbar region: Secondary | ICD-10-CM | POA: Diagnosis not present

## 2018-03-27 DIAGNOSIS — G8929 Other chronic pain: Secondary | ICD-10-CM

## 2018-03-27 DIAGNOSIS — M797 Fibromyalgia: Secondary | ICD-10-CM | POA: Diagnosis not present

## 2018-03-27 DIAGNOSIS — E559 Vitamin D deficiency, unspecified: Secondary | ICD-10-CM | POA: Diagnosis not present

## 2018-03-27 DIAGNOSIS — Z881 Allergy status to other antibiotic agents status: Secondary | ICD-10-CM | POA: Insufficient documentation

## 2018-03-27 DIAGNOSIS — Z79899 Other long term (current) drug therapy: Secondary | ICD-10-CM | POA: Insufficient documentation

## 2018-03-27 DIAGNOSIS — Z8614 Personal history of Methicillin resistant Staphylococcus aureus infection: Secondary | ICD-10-CM | POA: Insufficient documentation

## 2018-03-27 DIAGNOSIS — E042 Nontoxic multinodular goiter: Secondary | ICD-10-CM | POA: Insufficient documentation

## 2018-03-27 DIAGNOSIS — J45909 Unspecified asthma, uncomplicated: Secondary | ICD-10-CM | POA: Diagnosis not present

## 2018-03-27 DIAGNOSIS — R079 Chest pain, unspecified: Secondary | ICD-10-CM | POA: Diagnosis not present

## 2018-03-27 DIAGNOSIS — R1310 Dysphagia, unspecified: Secondary | ICD-10-CM | POA: Insufficient documentation

## 2018-03-27 DIAGNOSIS — Z886 Allergy status to analgesic agent status: Secondary | ICD-10-CM | POA: Insufficient documentation

## 2018-03-27 DIAGNOSIS — D649 Anemia, unspecified: Secondary | ICD-10-CM | POA: Insufficient documentation

## 2018-03-27 NOTE — Progress Notes (Signed)
Patient's Name: Karen Lewis  MRN: [id number]  Referring Provider: Idelle Crouch, MD  DOB: [birth year]  PCP: Idelle Crouch, MD  DOS: 03/27/2018  Note by: Vevelyn Francois NP  Service setting: Ambulatory outpatient  Specialty: Interventional Pain Management  Location: [facility] (AMB) Pain Management Facility    Patient type: Established    Primary Reason(s) for Visit: Encounter for prescription drug management & post-procedure evaluation of chronic illness with mild to moderate exacerbation(Level of risk: moderate) CC: Back Pain (mid)  HPI  Karen Lewis is a 47 y.o. year old, female patient, who comes today for a post-procedure evaluation and medication management. She has History of asthma; Binge eating disorder; Fibromyalgia; Anxiety, generalized; Insomnia, persistent; Depression, major, recurrent, moderate ([place]); Uncomplicated asthma; Eczema; Gout; Hyperlipidemia; Headache, migraine; Obstructive apnea; Bilateral polycystic ovarian syndrome; Apnea, sleep; Disease of thyroid gland; Avitaminosis D; History of surgical procedure; Type 2 diabetes mellitus without complication, without long-term current use of insulin ([place]); Long term current use of opiate analgesic; Long term prescription opiate use; Opiate use; Encounter for therapeutic drug level monitoring; Opiate dependence ([place]); Hypertension associated with diabetes ([place]); Goiter, nontoxic, multinodular; Chronic knee pain (Primary Source of Pain) (Bilateral) (R>L); Chronic low back pain [facility] source of pain) (Bilateral) (R>L); Lumbar facet syndrome (Bilateral) (R>L); Osteoarthritis; Grade 1 (1.4 cm) Anterolisthesis of L4 over L5; Chronic hip pain (Secondary source of pain) (Right); S/P THR (total hip replacement) (Left); History of methicillin resistant staphylococcus aureus (MRSA); History of bariatric surgery; Hypomagnesemia; Morbid obesity with body mass index (BMI) greater than or equal to 70 in adult [facility]); History of methicillin resistant  Staphylococcus aureus infection; Lumbar spondylosis; Vitamin D insufficiency; Dysphagia, unspecified; Gastroesophageal reflux disease without esophagitis; Chest pain with low risk of acute coronary syndrome; Nontoxic goiter; Other psychoactive substance dependence, uncomplicated (HCC); SOB (shortness of breath) on exertion; Chronic pain syndrome; Neurogenic pain; Upper extremity pain; Chronic shoulder pain (Left); Osteoarthritis of shoulder (Left); Controlled drug dependence ([place]); Osteoarthritis of hip (Right); Hyperlipidemia due to type 2 diabetes mellitus ([place]); Post-operative hypothyroidism; Secondary Osteoarthritis of knee (Bilateral) (R>L); Major depressive disorder, single episode; Depression; Dermatitis; Asthma; Encounter for dental examination; Encounter for dental exam and cleaning w/o abnormal findings; Spondylosis without myelopathy or radiculopathy, lumbosacral region; and History of gastric bypass on their problem list. Her primarily concern today is the Back Pain (mid)  Pain Assessment: Location: Medial Back Radiating: radiates down both leg but not at the same time Onset: More than a month ago Duration: Chronic pain Quality: Tingling, Aching, Dull, Throbbing, Constant Severity: 4 /10 (subjective, self-reported pain score)  Note: Reported level is compatible with observation.                          Effect on ADL: standing, walking Timing: Constant Modifying factors: repositioning , rest , pain meds BP: 122/77  HR: 81  Karen Lewis was last seen on 03/08/2018 for a procedure. During today's appointment we reviewed Karen Lewis's post-procedure results, as well as her outpatient medication regimen. She is doing great with her lumbar facet NB. She is wondering about her recent xray's of her shoulder and back. She admits that she continue to have right shoulder pain with weakness. She would also like to continues her knee injections. She is work with [employer] for her weight loss.   Further details  on both, my assessment(s), as well as the proposed treatment plan, please see below.  Post-Procedure Assessment  03/07/2018 Procedure: bilateral lumbar facet  nerve blocks Pre-procedure pain score:  4/10 Post-procedure pain score: 0/10         Influential Factors: BMI: 65.74 kg/m Intra-procedural challenges: None observed.         Assessment challenges: None detected.              Reported side-effects: None.        Post-procedural adverse reactions or complications: None reported         Sedation: Please see nurses note. When no sedatives are used, the analgesic levels obtained are directly associated to the effectiveness of the local anesthetics. However, when sedation is provided, the level of analgesia obtained during the initial 1 hour following the intervention, is believed to be the result of a combination of factors. These factors may include, but are not limited to: 1. The effectiveness of the local anesthetics used. 2. The effects of the analgesic(s) and/or anxiolytic(s) used. 3. The degree of discomfort experienced by the patient at the time of the procedure. 4. The patients ability and reliability in recalling and recording the events. 5. The presence and influence of possible secondary gains and/or psychosocial factors. Reported result: Relief experienced during the 1st hour after the procedure: 100 % (Ultra-Short Term Relief)            Interpretative annotation: Clinically appropriate result. Analgesia during this period is likely to be Local Anesthetic and/or IV Sedative (Analgesic/Anxiolytic) related.          Effects of local anesthetic: The analgesic effects attained during this period are directly associated to the localized infiltration of local anesthetics and therefore cary significant diagnostic value as to the etiological location, or anatomical origin, of the pain. Expected duration of relief is directly dependent on the pharmacodynamics of the local anesthetic used.  Long-acting (4-6 hours) anesthetics used.  Reported result: Relief during the next 4 to 6 hour after the procedure: 80 % (Short-Term Relief)            Interpretative annotation: Clinically appropriate result. Analgesia during this period is likely to be Local Anesthetic-related.          Long-term benefit: Defined as the period of time past the expected duration of local anesthetics (1 hour for short-acting and 4-6 hours for long-acting). With the possible exception of prolonged sympathetic blockade from the local anesthetics, benefits during this period are typically attributed to, or associated with, other factors such as analgesic sensory neuropraxia, antiinflammatory effects, or beneficial biochemical changes provided by agents other than the local anesthetics.  Reported result: Extended relief following procedure: 70 % (Long-Term Relief)            Interpretative annotation: Clinically appropriate result. Good relief. No permanent benefit expected. Inflammation plays a part in the etiology to the pain.          Current benefits: Defined as reported results that persistent at this point in time.   Analgesia: 50-75 %            Function: Karen Lewis reports improvement in function ROM: Karen Lewis reports improvement in ROM Interpretative annotation: Ongoing benefit.    Effective diagnostic intervention.          Interpretation: Results would suggest a successful diagnostic intervention.                  Plan:  Please see "Plan of Care" for details.                Laboratory Chemistry  Inflammation Markers (  CRP: Acute Phase) (ESR: Chronic Phase) Lab Results  Component Value Date   CRP <0.5 11/24/2015   ESRSEDRATE 20 01/30/2018                         Rheumatology Markers No results found for: RF, ANA, LABURIC, URICUR, LYMEIGGIGMAB, LYMEABIGMQN, HLAB27                      Renal Function Markers Lab Results  Component Value Date   BUN 8 11/24/2015   CREATININE 0.80 05/11/2016   GFRAA  >60 11/24/2015   GFRNONAA >60 11/24/2015                             Hepatic Function Markers Lab Results  Component Value Date   AST 23 11/24/2015   ALT 20 11/24/2015   ALBUMIN 3.7 11/24/2015   ALKPHOS 81 11/24/2015                        Electrolytes Lab Results  Component Value Date   NA 139 11/24/2015   K 4.3 11/24/2015   CL 105 11/24/2015   CALCIUM 8.6 (L) 11/24/2015   MG 1.6 01/30/2018                        Neuropathy Markers Lab Results  Component Value Date   VITAMINB12 303 11/24/2015                        Bone Pathology Markers Lab Results  Component Value Date   VD25OH 27.4 (L) 11/24/2015   VD125OH2TOT 41.5 11/24/2015                         Coagulation Parameters Lab Results  Component Value Date   INR 0.95 05/07/2015   LABPROT 12.9 05/07/2015   APTT 26 05/07/2015   PLT 337 11/12/2015                        Cardiovascular Markers Lab Results  Component Value Date   HGB 12.4 11/12/2015   HCT 38.0 11/12/2015                         CA Markers No results found for: CEA, CA125, LABCA2                      Note: Lab results reviewed.  Recent Diagnostic Imaging Results  DG C-Arm 1-60 Min-No Report Fluoroscopy was utilized by the requesting physician.  No radiographic  interpretation.   Complexity Note: Imaging results reviewed. Results shared with Karen Lewis, using Layman's terms.                         Meds   Current Outpatient Medications:  .  Acetaminophen 500 MG coapsule, Take 500 mg by mouth every 6 (six) hours as needed. , Disp: , Rfl:  .  acyclovir (ZOVIRAX) 800 MG tablet, Take 800 mg by mouth 3 times/day as needed-between meals & bedtime. Reported on 11/12/2015, Disp: , Rfl:  .  albuterol (PROVENTIL HFA;VENTOLIN HFA) 108 (90 BASE) MCG/ACT inhaler, Inhale 2 puffs into the lungs every 6 (six) hours as needed for wheezing or shortness of breath., Disp: ,  Rfl:  .  amitriptyline (ELAVIL) 100 MG tablet, , Disp: , Rfl: 3 .  Blood Glucose  Calibration (ACCU-CHEK AVIVA) SOLN, , Disp: , Rfl:  .  Blood Glucose Monitoring Suppl (ACCU-CHEK AVIVA PLUS) w/Device KIT, , Disp: , Rfl:  .  butalbital-acetaminophen-caffeine (FIORICET, ESGIC) 50-325-40 MG tablet, 1 tablet every 6 (six) hours as needed. , Disp: , Rfl:  .  diphenhydrAMINE (BENADRYL) 25 mg capsule, Take by mouth., Disp: , Rfl:  .  estradiol (ESTRACE) 0.1 MG/GM vaginal cream, , Disp: , Rfl:  .  famotidine (PEPCID) 20 MG tablet, Take by mouth., Disp: , Rfl:  .  gabapentin (NEURONTIN) 800 MG tablet, Take 1 tablet (800 mg total) by mouth every 6 (six) hours., Disp: 360 tablet, Rfl: 0 .  guaiFENesin-codeine 100-10 MG/5ML syrup, Take by mouth., Disp: , Rfl:  .  ipratropium-albuterol (DUONEB) 0.5-2.5 (3) MG/3ML SOLN, 3 mLs as needed. , Disp: , Rfl:  .  ketoconazole (NIZORAL) 2 % cream, APPLY AS DIRECTED TO AFFECTED AREA TWICE DAILY AS NEEDED, Disp: , Rfl:  .  ketorolac (TORADOL) 10 MG tablet, Take 1 tablet (10 mg total) by mouth every 6 (six) hours as needed., Disp: 20 tablet, Rfl: 0 .  Lancet Devices (SIMPLE DIAGNOSTICS LANCING DEV) MISC, Use 1 each as directed. Accu Chek Softclix. DX: E11.8, Disp: , Rfl:  .  levocetirizine (XYZAL) 5 MG tablet, Take 5 mg by mouth every evening. , Disp: , Rfl:  .  levothyroxine (SYNTHROID, LEVOTHROID) 200 MCG tablet, Take on an empty stomach each morning before breakfast. Total dose 250 mcg daily., Disp: , Rfl:  .  magnesium oxide (MAG-OX) 400 MG tablet, Take 1 tablet (400 mg total) by mouth 2 (two) times daily., Disp: 60 tablet, Rfl: 2 .  meclizine (ANTIVERT) 25 MG tablet, Take 1 tablet (25 mg total) by mouth 3 (three) times daily as needed for dizziness., Disp: 30 tablet, Rfl: 0 .  medroxyPROGESTERone (DEPO-PROVERA) 150 MG/ML injection, Inject 1 mL (150 mg total) into the muscle every 3 (three) months., Disp: 1 mL, Rfl: 3 .  metFORMIN (GLUCOPHAGE) 1000 MG tablet, Take 1,000 mg by mouth 2 (two) times daily with a meal. , Disp: , Rfl:  .  metoprolol  (LOPRESSOR) 50 MG tablet, Take 75 mg by mouth 2 (two) times daily. , Disp: , Rfl:  .  montelukast (SINGULAIR) 10 MG tablet, Take 10 mg by mouth at bedtime. , Disp: , Rfl:  .  Multiple Vitamin (MULTIVITAMIN WITH MINERALS) TABS tablet, Take 2 tablets by mouth daily. Reported on 11/12/2015, Disp: , Rfl:  .  naloxone (NARCAN) nasal spray 4 mg/0.1 mL, Spray into one nostril. Repeat with second device into other nostril after 2-3 minutes if no or minimal response., Disp: 1 kit, Rfl: 0 .  nitrofurantoin, macrocrystal-monohydrate, (MACROBID) 100 MG capsule, , Disp: , Rfl:  .  omeprazole (PRILOSEC) 40 MG capsule, Take 40 mg by mouth 2 (two) times daily. , Disp: , Rfl:  .  oxyCODONE (OXY IR/ROXICODONE) 5 MG immediate release tablet, Take 1 tablet (5 mg total) by mouth 5 (five) times daily as needed for severe pain., Disp: 150 tablet, Rfl: 0 .  pioglitazone (ACTOS) 15 MG tablet, , Disp: , Rfl:  .  QUEtiapine (SEROQUEL) 100 MG tablet, Take 1 tablet (100 mg total) by mouth at bedtime., Disp: 90 tablet, Rfl: 0 .  ranitidine (ZANTAC) 150 MG capsule, Take by mouth., Disp: , Rfl:  .  rosuvastatin (CRESTOR) 40 MG tablet, Take 40 mg by mouth daily. ,  Disp: , Rfl:  .  sertraline (ZOLOFT) 100 MG tablet, Take 2 tablets (200 mg total) by mouth daily., Disp: 180 tablet, Rfl: 2 .  spironolactone (ALDACTONE) 25 MG tablet, Take 37.5 mg by mouth 2 (two) times daily. , Disp: , Rfl:  .  tiZANidine (ZANAFLEX) 4 MG tablet, 4 mg at bedtime. , Disp: , Rfl:  .  triamcinolone (KENALOG) 0.1 % paste, Apply 1 application topically 2 (two) times daily. , Disp: , Rfl:  .  triamcinolone (NASACORT AQ) 55 MCG/ACT AERO nasal inhaler, 2 sprays by Nasal route 2 (two) times daily., Disp: , Rfl:  .  TRUE METRIX BLOOD GLUCOSE TEST test strip, , Disp: , Rfl:  .  ULTICARE ALCOHOL SWABS PADS, Apply 1 each topically 2 (two) times daily. Use when checking BG. DX: E11.8, Disp: , Rfl:  .  zonisamide (ZONEGRAN) 50 MG capsule, Take 150 mg by mouth daily. 3  tabs at bedtime, Disp: , Rfl:  .  levothyroxine (SYNTHROID, LEVOTHROID) 50 MCG tablet, Take 50 mcg by mouth daily before breakfast. , Disp: , Rfl:  .  oxyCODONE (OXY IR/ROXICODONE) 5 MG immediate release tablet, Take 1 tablet (5 mg total) by mouth 5 (five) times daily as needed for severe pain., Disp: 150 tablet, Rfl: 0 .  oxyCODONE (OXY IR/ROXICODONE) 5 MG immediate release tablet, Take 1 tablet (5 mg total) by mouth 5 (five) times daily as needed for severe pain., Disp: 150 tablet, Rfl: 0 .  phentermine 15 MG capsule, Take 37.5 mg by mouth. , Disp: , Rfl:  .  ranitidine (ZANTAC) 150 MG capsule, Take 150 mg by mouth every evening. , Disp: , Rfl:   ROS  Constitutional: Denies any fever or chills Gastrointestinal: No reported hemesis, hematochezia, vomiting, or acute GI distress Musculoskeletal: Denies any acute onset joint swelling, redness, loss of ROM, or weakness Neurological: No reported episodes of acute onset apraxia, aphasia, dysarthria, agnosia, amnesia, paralysis, loss of coordination, or loss of consciousness  Allergies  Karen Lewis is allergic to bactrim [sulfamethoxazole-trimethoprim]; omalizumab; ciprofloxacin; shellfish allergy; aspirin; clindamycin; motrin [ibuprofen]; nsaids; other; and sulfa antibiotics.  [place]  Drug: Karen Lewis  reports that she does not use drugs. Alcohol:  reports that she does not drink alcohol. Tobacco:  reports that she has never smoked. She has never used smokeless tobacco. Medical:  has a past medical history of Anemia, Anginal pain ([place]), Anxiety, Arthralgia of hip (07/29/2015), Arthritis, Arthritis, degenerative (07/29/2015), Asthma, Cephalalgia (07/25/2014), Dependence on unknown drug ([place]), Depression, Diabetes mellitus without complication ([place]), Dysrhythmia, Eczema, Fibromyalgia, Gastritis, GERD (gastroesophageal reflux disease), Gonalgia (07/29/2015), Gout, H/O cardiovascular disorder (03/10/2015), H/O surgical procedure (12/05/2012), H/O thyroid disease  (03/10/2015), Headache, Herpes, History of artificial joint (07/29/2015), History of hiatal hernia, Hypertension, Hypomagnesemia, Hypothyroidism, LBP (low back pain) (07/29/2015), Neuromuscular disorder ([place]), Obesity, PCOS (polycystic ovarian syndrome), Primary osteoarthritis of both knees (07/29/2015), Sleep apnea, and Thyroid nodule (bilateral). Surgical: Karen Lewis  has a past surgical history that includes Laparoscopic partial gastrectomy; Shoulder arthroscopy (Right); Joint replacement (Left, hip); Carpal tunnel release (Bilateral); Diagnostic laparoscopy; Cholecystectomy; Trigger finger release (Right); Thyroidectomy (N/A, 11/12/2015); left trigger finger; Roux-en-Y Gastric Bypass (06/03/2017); and Hiatal hernia repair. Family: family history includes Alcohol abuse in her father and mother; Anxiety disorder in her father and mother; Breast cancer in her paternal aunt; COPD in her father; Depression in her brother, father, and mother; Diabetes in her brother, father, and mother; Hypertension in her brother, father, and mother; Kidney cancer in her mother; Kidney failure in her father;  Post-traumatic stress disorder in her father; Sleep apnea in her brother, father, and mother.  Constitutional Exam  General appearance: Well nourished, well developed, and well hydrated. In no apparent acute distress Vitals:   03/27/18 1035  BP: 122/77  Lewis: 81  Temp: 98.9 F (37.2 C)  SpO2: 99%  Weight: (!) 383 lb (173.7 kg)  Height: '5\' 4"'  (1.626 m)   BMI Assessment: Estimated body mass index is 65.74 kg/m as calculated from the following:   Height as of this encounter: '5\' 4"'  (1.626 m).   Weight as of this encounter: 383 lb (173.7 kg).  BMI interpretation table: BMI level Category Range association with higher incidence of chronic pain  <18 kg/m2 Underweight   18.5-24.9 kg/m2 Ideal body weight   25-29.9 kg/m2 Overweight Increased incidence by 20%  30-34.9 kg/m2 Obese (Class I) Increased incidence by 68%   35-39.9 kg/m2 Severe obesity (Class II) Increased incidence by 136%  >40 kg/m2 Extreme obesity (Class III) Increased incidence by 254%   Patient's current BMI Ideal Body weight  Body mass index is 65.74 kg/m. Ideal body weight: 54.7 kg (120 lb 9.5 oz) Adjusted ideal body weight: 102.3 kg (225 lb 8.9 oz)   BMI Readings from Last 4 Encounters:  03/27/18 65.74 kg/m  03/07/18 65.81 kg/m  02/03/18 65.23 kg/m  01/30/18 65.23 kg/m   Wt Readings from Last 4 Encounters:  03/27/18 (!) 383 lb (173.7 kg)  03/07/18 (!) 383 lb 6.4 oz (173.9 kg)  02/03/18 (!) 380 lb (172.4 kg)  01/30/18 (!) 380 lb (172.4 kg)  Psych/Mental status: Alert, oriented x 3 (person, place, & time)       Eyes: PERLA Respiratory: No evidence of acute respiratory distress  Cervical Spine Area Exam  Skin & Axial Inspection: No masses, redness, edema, swelling, or associated skin lesions Alignment: Symmetrical Functional ROM: Unrestricted ROM      Stability: No instability detected Muscle Tone/Strength: Functionally intact. No obvious neuro-muscular anomalies detected. Sensory (Neurological): Unimpaired Palpation: No palpable anomalies              Upper Extremity (UE) Exam    Side: Right upper extremity  Side: Left upper extremity  Skin & Extremity Inspection: Skin color, temperature, and hair growth are WNL. No peripheral edema or cyanosis. No masses, redness, swelling, asymmetry, or associated skin lesions. No contractures.  Skin & Extremity Inspection: Skin color, temperature, and hair growth are WNL. No peripheral edema or cyanosis. No masses, redness, swelling, asymmetry, or associated skin lesions. No contractures.  Functional ROM: Unrestricted ROM          Functional ROM: Unrestricted ROM          Muscle Tone/Strength: Functionally intact. No obvious neuro-muscular anomalies detected.  Muscle Tone/Strength: Functionally intact. No obvious neuro-muscular anomalies detected.  Sensory (Neurological): Unimpaired           Sensory (Neurological): Unimpaired          Palpation: No palpable anomalies              Palpation: No palpable anomalies              Provocative Test(s):  Phalen's test: deferred Tinel's test: deferred Apley's scratch test (touch opposite shoulder):  Action 1 (Across chest): deferred Action 2 (Overhead): deferred Action 3 (LB reach): deferred   Provocative Test(s):  Phalen's test: deferred Tinel's test: deferred Apley's scratch test (touch opposite shoulder):  Action 1 (Across chest): deferred Action 2 (Overhead): deferred Action 3 (LB reach): deferred  Lumbar Spine Area Exam  Skin & Axial Inspection: No masses, redness, or swelling Alignment: Symmetrical Functional ROM: Unrestricted ROM       Stability: No instability detected Muscle Tone/Strength: Functionally intact. No obvious neuro-muscular anomalies detected. Sensory (Neurological): Unimpaired Palpation: No palpable anomalies       Provocative Tests: Lumbar Hyperextension/rotation test: deferred today       Lumbar quadrant test (Kemp's test): deferred today       Lumbar Lateral bending test: deferred today       Patrick's Maneuver: deferred today                   FABER test: deferred today                   Thigh-thrust test: deferred today       S-I compression test: deferred today       S-I distraction test: deferred today        Gait & Posture Assessment  Ambulation: Patient ambulates using a wheel chair Gait: Relatively normal for age and body habitus Posture: WNL   Assessment  Primary Diagnosis & Pertinent Problem List: The primary encounter diagnosis was Lumbar spondylosis. Diagnoses of Chronic shoulder pain (Left), Chronic knee pain (Primary Source of Pain) (Bilateral) (R>L), Osteoarthritis of hip (Right), Chronic pain syndrome, and Long term prescription opiate use were also pertinent to this visit.  Status Diagnosis  Controlled Persistent Persistent 1. Lumbar spondylosis   2. Chronic  shoulder pain (Left)   3. Chronic knee pain (Primary Source of Pain) (Bilateral) (R>L)   4. Osteoarthritis of hip (Right)   5. Chronic pain syndrome   6. Long term prescription opiate use     Problems updated and reviewed during this visit: No problems updated. Plan of Care  Pharmacotherapy (Medications Ordered): No orders of the defined types were placed in this encounter.  New Prescriptions   No medications on file   Medications administered today: Karen Lewis had no medications administered during this visit. Lab-work, procedure(s), and/or referral(s): Orders Placed This Encounter  Procedures  . SHOULDER INJECTION   Imaging and/or referral(s): None  Interventional therapies: Planned, scheduled, and/or pending: Bilateral Hyalgan knee injections #4 Left Intra-articular shoulder injections.  Pt will like to have additional lumbar facet nerve blocks very effective.   Considering:  Series of 5 Hyalgan knee injections    Palliative PRN treatment(s):  Intra-articular Hyalgan knee injections.  Palliative bilateral genicular nerve block  Palliative bilateral lumbar facet block   Provider-requested follow-up: Return for Appointment As Scheduled, in addition, w/ Dr. [name], ([facility]).  Future Appointments  Date Time Provider [place]  03/30/2018 12:30 PM Milinda Pointer, MD [facility] None  04/13/2018  9:00 AM Vevelyn Francois, NP [facility] None   Primary Care Physician: Idelle Crouch, MD Location: [facility] Outpatient Pain Management Facility Note by: Vevelyn Francois NP Date: 03/27/2018; Time: 4:51 PM  Pain Score Disclaimer: We use the NRS-11 scale. This is a self-reported, subjective measurement of pain severity with only modest accuracy. It is used primarily to identify changes within a particular patient. It must be understood that outpatient pain scales are significantly less accurate that those used for research, where they can be applied under ideal  controlled circumstances with minimal exposure to variables. In reality, the score is likely to be a combination of pain intensity and pain affect, where pain affect describes the degree of emotional arousal or changes in action readiness caused by the sensory experience of  pain. Factors such as social and work situation, setting, emotional state, anxiety levels, expectation, and prior pain experience may influence pain perception and show large inter-individual differences that may also be affected by time variables.  Patient instructions provided during this appointment: There are no Patient Instructions on file for this visit.

## 2018-03-30 ENCOUNTER — Encounter: Payer: Self-pay | Admitting: Pain Medicine

## 2018-03-30 ENCOUNTER — Ambulatory Visit
Admission: RE | Admit: 2018-03-30 | Discharge: 2018-03-30 | Disposition: A | Discharge status: Home / Self Care | Payer: Medicare HMO | Source: Ambulatory Visit | Attending: Pain Medicine | Admitting: Pain Medicine

## 2018-03-30 ENCOUNTER — Ambulatory Visit (HOSPITAL_BASED_OUTPATIENT_CLINIC_OR_DEPARTMENT_OTHER): Payer: Medicare HMO | Admitting: Pain Medicine

## 2018-03-30 ENCOUNTER — Other Ambulatory Visit: Payer: Self-pay

## 2018-03-30 VITALS — BP 112/85 | HR 76 | Temp 99.1°F | Resp 19 | Ht 64.0 in | Wt 383.0 lb

## 2018-03-30 DIAGNOSIS — Z79890 Hormone replacement therapy (postmenopausal): Secondary | ICD-10-CM | POA: Diagnosis not present

## 2018-03-30 DIAGNOSIS — M25562 Pain in left knee: Secondary | ICD-10-CM

## 2018-03-30 DIAGNOSIS — G8929 Other chronic pain: Secondary | ICD-10-CM | POA: Insufficient documentation

## 2018-03-30 DIAGNOSIS — Z9049 Acquired absence of other specified parts of digestive tract: Secondary | ICD-10-CM | POA: Diagnosis not present

## 2018-03-30 DIAGNOSIS — Z9884 Bariatric surgery status: Secondary | ICD-10-CM | POA: Insufficient documentation

## 2018-03-30 DIAGNOSIS — M25561 Pain in right knee: Secondary | ICD-10-CM | POA: Diagnosis not present

## 2018-03-30 DIAGNOSIS — Z7984 Long term (current) use of oral hypoglycemic drugs: Secondary | ICD-10-CM | POA: Insufficient documentation

## 2018-03-30 DIAGNOSIS — Z79899 Other long term (current) drug therapy: Secondary | ICD-10-CM | POA: Diagnosis not present

## 2018-03-30 DIAGNOSIS — M25512 Pain in left shoulder: Secondary | ICD-10-CM | POA: Insufficient documentation

## 2018-03-30 DIAGNOSIS — M19012 Primary osteoarthritis, left shoulder: Secondary | ICD-10-CM | POA: Insufficient documentation

## 2018-03-30 DIAGNOSIS — M174 Other bilateral secondary osteoarthritis of knee: Secondary | ICD-10-CM | POA: Diagnosis not present

## 2018-03-30 DIAGNOSIS — Z6841 Body Mass Index (BMI) 40.0 and over, adult: Secondary | ICD-10-CM | POA: Insufficient documentation

## 2018-03-30 DIAGNOSIS — M170 Bilateral primary osteoarthritis of knee: Secondary | ICD-10-CM | POA: Insufficient documentation

## 2018-03-30 DIAGNOSIS — Z903 Acquired absence of stomach [part of]: Secondary | ICD-10-CM | POA: Insufficient documentation

## 2018-03-30 MED ORDER — SODIUM HYALURONATE (VISCOSUP) 20 MG/2ML IX SOSY
2.0000 mL | PREFILLED_SYRINGE | Freq: Once | INTRA_ARTICULAR | Status: AC
  Administered 2018-03-30: 14:00:00 via INTRA_ARTICULAR

## 2018-03-30 MED ORDER — ROPIVACAINE HCL 2 MG/ML IJ SOLN
4.0000 mL | Freq: Once | INTRAMUSCULAR | Status: AC
  Administered 2018-03-30: 4 mL via INTRA_ARTICULAR

## 2018-03-30 MED ORDER — LACTATED RINGERS IV SOLN
1000.0000 mL | Freq: Once | INTRAVENOUS | Status: AC
  Administered 2018-03-30: 1000 mL via INTRAVENOUS

## 2018-03-30 MED ORDER — FENTANYL CITRATE (PF) 100 MCG/2ML IJ SOLN
25.0000 ug | INTRAMUSCULAR | Status: DC | PRN
  Administered 2018-03-30: 100 ug via INTRAVENOUS

## 2018-03-30 MED ORDER — MIDAZOLAM HCL 5 MG/5ML IJ SOLN
1.0000 mg | INTRAMUSCULAR | Status: DC | PRN
  Administered 2018-03-30: 3 mg via INTRAVENOUS

## 2018-03-30 MED ORDER — LIDOCAINE HCL 2 % IJ SOLN
20.0000 mL | Freq: Once | INTRAMUSCULAR | Status: AC
  Administered 2018-03-30: 400 mg

## 2018-03-30 MED ORDER — METHYLPREDNISOLONE ACETATE 80 MG/ML IJ SUSP
80.0000 mg | Freq: Once | INTRAMUSCULAR | Status: AC
  Administered 2018-03-30: 80 mg via INTRA_ARTICULAR

## 2018-03-30 MED ORDER — LIDOCAINE HCL (PF) 1 % IJ SOLN
4.0000 mL | Freq: Once | INTRAMUSCULAR | Status: AC
  Administered 2018-03-30: 4 mL

## 2018-03-30 MED ORDER — SODIUM HYALURONATE (VISCOSUP) 20 MG/2ML IX SOSY
2.0000 mL | PREFILLED_SYRINGE | Freq: Once | INTRA_ARTICULAR | Status: AC
  Administered 2018-03-30: 2 mL via INTRA_ARTICULAR

## 2018-03-30 NOTE — Progress Notes (Signed)
Patient's Name: Karen Lewis  MRN: [id number]  Referring Provider: Idelle Crouch, MD  DOB: 1971-03-28  PCP: Idelle Crouch, MD  DOS: 03/30/2018  Note by: Gaspar Cola, MD  Service setting: Ambulatory outpatient  Specialty: Interventional Pain Management  Patient type: Established  Location: [facility] (AMB) Pain Management Facility  Visit type: Interventional Procedure   Primary Reason for Visit: Interventional Pain Management Treatment. CC: Knee Pain (right) and Back Pain (lower)  Procedure #1:  Anesthesia, Analgesia, Anxiolysis:  Type: Therapeutic Intra-Articular Hyalgan Knee Injection #4  Region: Lateral infrapatellar Knee Region Level: Knee Joint Laterality: Bilateral  Type: Moderate (Conscious) Sedation combined with Local Anesthesia Indication(s): Analgesia and Anxiety Local Anesthetic: Lidocaine 1-2% Route: Intravenous (IV) IV Access: Secured Sedation: Meaningful verbal contact was maintained at all times during the procedure    Indications: 1. Secondary Osteoarthritis of knee (Bilateral) (R>L)   2. Chronic knee pain (Primary Source of Pain) (Bilateral) (R>L)   3. Morbid obesity with body mass index (BMI) greater than or equal to 70 in adult [facility])    Procedure #2:  Anesthesia, Analgesia, Anxiolysis:  Type: Diagnostic Glenohumeral Joint (shoulder) Injection          Primary Purpose: Diagnostic Region: Superior Shoulder Area Level:  Shoulder Target Area: Glenohumeral Joint (shoulder) Approach: Anterior approach. Laterality: Left-Sided Position: Supine  Type: Moderate (Conscious) Sedation combined with Local Anesthesia Indication(s): Analgesia and Anxiety Route: Intravenous (IV) IV Access: Secured Sedation: Meaningful verbal contact was maintained at all times during the procedure  Local Anesthetic: Lidocaine 1-2%   Indications: 1. Osteoarthritis of shoulder (Left)   2. Chronic shoulder pain (Left)   3. Morbid obesity with body mass index (BMI) greater than or  equal to 70 in adult [facility])    Pain Score: Pre-procedure: 3 /10 Post-procedure: 0-No pain/10  Pre-op Assessment:  Karen Lewis is a 47 y.o. (year old), female patient, seen today for interventional treatment. She  has a past surgical history that includes Laparoscopic partial gastrectomy; Shoulder arthroscopy (Right); Joint replacement (Left, hip); Carpal tunnel release (Bilateral); Diagnostic laparoscopy; Cholecystectomy; Trigger finger release (Right); Thyroidectomy (N/A, 11/12/2015); left trigger finger; Roux-en-Y Gastric Bypass (06/03/2017); and Hiatal hernia repair. Karen Lewis has a current medication list which includes the following prescription(s): acetaminophen, acyclovir, albuterol, amitriptyline, biotin, accu-chek aviva, accu-chek aviva plus, butalbital-acetaminophen-caffeine, diphenhydramine, estradiol, famotidine, gabapentin, guaifenesin-codeine, ipratropium-albuterol, ketoconazole, ketorolac, simple diagnostics lancing dev, levocetirizine, levothyroxine, magnesium oxide, meclizine, medroxyprogesterone, metformin, metoprolol tartrate, montelukast, multivitamin with minerals, naloxone, nitrofurantoin (macrocrystal-monohydrate), omeprazole, oxycodone, pioglitazone, quetiapine, ranitidine, rosuvastatin, sertraline, spironolactone, tizanidine, triamcinolone, triamcinolone, true metrix blood glucose test, ulticare alcohol swabs, zolpidem, zonisamide, levothyroxine, levothyroxine, oxycodone, oxycodone, phentermine, and ranitidine, and the following Facility-Administered Medications: fentanyl and midazolam. Her primarily concern today is the Knee Pain (right) and Back Pain (lower)  Initial Vital Signs:  Pulse/HCG Rate: 82ECG Heart Rate: 80 Temp: 99.1 F (37.3 C) Resp: 18 BP: (!) 118/92 SpO2: 98 %  BMI: Estimated body mass index is 65.74 kg/m as calculated from the following:   Height as of this encounter: 5\' 4"  (1.626 m).   Weight as of this encounter: 383 lb (173.7 kg).  Risk  Assessment: Allergies: Reviewed. She is allergic to bactrim [sulfamethoxazole-trimethoprim]; omalizumab; ciprofloxacin; shellfish allergy; aspirin; clindamycin; motrin [ibuprofen]; nsaids; other; and sulfa antibiotics.  Allergy Precautions: No iodine containing solutions or radiological contrast used. Coagulopathies: Reviewed. None identified.  Blood-thinner therapy: None at this time Active Infection(s): Reviewed. None identified. Karen Lewis is afebrile  Site Confirmation: Karen Lewis was asked to confirm the procedure  and laterality before marking the site Procedure checklist: Completed Consent: Before the procedure and under the influence of no sedative(s), amnesic(s), or anxiolytics, the patient was informed of the treatment options, risks and possible complications. To fulfill our ethical and legal obligations, as recommended by the American Medical Association's Code of Ethics, I have informed the patient of my clinical impression; the nature and purpose of the treatment or procedure; the risks, benefits, and possible complications of the intervention; the alternatives, including doing nothing; the risk(s) and benefit(s) of the alternative treatment(s) or procedure(s); and the risk(s) and benefit(s) of doing nothing. The patient was provided information about the general risks and possible complications associated with the procedure. These may include, but are not limited to: failure to achieve desired goals, infection, bleeding, organ or nerve damage, allergic reactions, paralysis, and death. In addition, the patient was informed of those risks and complications associated to the procedure, such as failure to decrease pain; infection; bleeding; organ or nerve damage with subsequent damage to sensory, motor, and/or autonomic systems, resulting in permanent pain, numbness, and/or weakness of one or several areas of the body; allergic reactions; (i.e.: anaphylactic reaction); and/or death. Furthermore,  the patient was informed of those risks and complications associated with the medications. These include, but are not limited to: allergic reactions (i.e.: anaphylactic or anaphylactoid reaction(s)); adrenal axis suppression; blood sugar elevation that in diabetics may result in ketoacidosis or comma; water retention that in patients with history of congestive heart failure may result in shortness of breath, pulmonary edema, and decompensation with resultant heart failure; weight gain; swelling or edema; medication-induced neural toxicity; particulate matter embolism and blood vessel occlusion with resultant organ, and/or nervous system infarction; and/or aseptic necrosis of one or more joints. Finally, the patient was informed that Medicine is not an exact science; therefore, there is also the possibility of unforeseen or unpredictable risks and/or possible complications that may result in a catastrophic outcome. The patient indicated having understood very clearly. We have given the patient no guarantees and we have made no promises. Enough time was given to the patient to ask questions, all of which were answered to the patient's satisfaction. Ms. Tenenbaum has indicated that she wanted to continue with the procedure. Attestation: I, the ordering provider, attest that I have discussed with the patient the benefits, risks, side-effects, alternatives, likelihood of achieving goals, and potential problems during recovery for the procedure that I have provided informed consent. Date  Time: 03/30/2018 12:50 PM  Pre-Procedure Preparation:  Monitoring: As per clinic protocol. Respiration, ETCO2, SpO2, BP, heart rate and rhythm monitor placed and checked for adequate function Safety Precautions: Patient was assessed for positional comfort and pressure points before starting the procedure. Time-out: I initiated and conducted the "Time-out" before starting the procedure, as per protocol. The patient was asked to  participate by confirming the accuracy of the "Time Out" information. Verification of the correct person, site, and procedure were performed and confirmed by me, the nursing staff, and the patient. "Time-out" conducted as per Joint Commission's Universal Protocol (UP.01.01.01).  Description of Procedure #1:  Position: Supine Target Area: Knee Joint Approach: Just above the Lateral tibial plateau, lateral to the infrapatellar tendon. Area Prepped: Entire knee area, from the mid-thigh to the mid-shin. Prepping solution: ChloraPrep (2% chlorhexidine gluconate and 70% isopropyl alcohol) Safety Precautions: Aspiration looking for blood return was conducted prior to all injections. At no point did we inject any substances, as a needle was being advanced. No attempts were made at  seeking any paresthesias. Safe injection practices and needle disposal techniques used. Medications properly checked for expiration dates. SDV (single dose vial) medications used. Description of the Procedure: Protocol guidelines were followed. The patient was placed in position over the fluoroscopy table. The target area was identified and the area prepped in the usual manner. Skin & deeper tissues infiltrated with local anesthetic. Appropriate amount of time allowed to pass for local anesthetics to take effect. The procedure needles were then advanced to the target area. Proper needle placement secured. Negative aspiration confirmed. Solution injected in intermittent fashion, asking for systemic symptoms every 0.5cc of injectate. The needles were then removed and the area cleansed, making sure to leave some of the prepping solution back to take advantage of its long term bactericidal properties.  Materials:  Needle(s) Type: Regular needle Gauge: 22G Length: 3.5-in Medication(s): Please see orders for medications and dosing details.  Imaging Guidance for procedure #1:  Type of Imaging Technique: Fluoroscopy Guidance  (Non-spinal) Indication(s): Morbid obesity. Assistance in needle guidance and placement for procedures requiring needle placement in or near specific anatomical locations impossible to access without such assistance. Exposure Time: Please see nurses notes. Contrast: None used. Fluoroscopic Guidance: I was personally present during the use of fluoroscopy. "Tunnel Vision Technique" used to obtain the best possible view of the target area. Parallax error corrected before commencing the procedure. "Direction-depth-direction" technique used to introduce the needle under continuous pulsed fluoroscopy. Once target was reached, antero-posterior, oblique, and lateral fluoroscopic projection used confirm needle placement in all planes. Images permanently stored in EMR. Ultrasound Guidance: N/A Interpretation: No contrast injected. I personally interpreted the imaging intraoperatively. Adequate needle placement confirmed in multiple planes. Permanent images saved into the patient's record.  Description of Procedure #2:  Area Prepped: Entire shoulder Area Prepping solution: ChloraPrep (2% chlorhexidine gluconate and 70% isopropyl alcohol) Safety Precautions: Aspiration looking for blood return was conducted prior to all injections. At no point did we inject any substances, as a needle was being advanced. No attempts were made at seeking any paresthesias. Safe injection practices and needle disposal techniques used. Medications properly checked for expiration dates. SDV (single dose vial) medications used. Description of the Procedure: Protocol guidelines were followed. The patient was placed in position over the procedure table. The target area was identified and the area prepped in the usual manner. Skin & deeper tissues infiltrated with local anesthetic. Appropriate amount of time allowed to pass for local anesthetics to take effect. The procedure needles were then advanced to the target area. Proper needle  placement secured. Negative aspiration confirmed. Solution injected in intermittent fashion, asking for systemic symptoms every 0.5cc of injectate. The needles were then removed and the area cleansed, making sure to leave some of the prepping solution back to take advantage of its long term bactericidal properties.    Vitals:   03/30/18 1410 03/30/18 1420 03/30/18 1430 03/30/18 1440  BP: 109/88 110/86 114/84 112/85  Pulse: 76     Resp: 17 18 18 19   Temp:      SpO2: 99% 99% 98% 99%  Weight:      Height:       End Time: 1409 hrs. Materials:  Needle(s) Type: Regular needle Gauge: 22G Length: 3.5-in Medication(s): Please see orders for medications and dosing details.  Imaging Guidance (Non-Spinal) for procedure #2:  Type of Imaging Technique: Fluoroscopy Guidance (Non-Spinal) Indication(s): Assistance in needle guidance and placement for procedures requiring needle placement in or near specific anatomical locations not easily accessible without  such assistance. Exposure Time: Please see nurses notes. Contrast: None used. Fluoroscopic Guidance: I was personally present during the use of fluoroscopy. "Tunnel Vision Technique" used to obtain the best possible view of the target area. Parallax error corrected before commencing the procedure. "Direction-depth-direction" technique used to introduce the needle under continuous pulsed fluoroscopy. Once target was reached, antero-posterior, oblique, and lateral fluoroscopic projection used confirm needle placement in all planes. Images permanently stored in EMR. Interpretation: No contrast injected. I personally interpreted the imaging intraoperatively. Adequate needle placement confirmed in multiple planes. Permanent images saved into the patient's record.  Antibiotic Prophylaxis:   Anti-infectives (From admission, onward)   None     Indication(s): None identified  Post-operative Assessment:  Post-procedure Vital Signs:  Pulse/HCG Rate:  7680 Temp: 99.1 F (37.3 C) Resp: 19 BP: 112/85 SpO2: 99 %  EBL: None  Complications: No immediate post-treatment complications observed by team, or reported by patient.  Note: The patient tolerated the entire procedure well. A repeat set of vitals were taken after the procedure and the patient was kept under observation following institutional policy, for this type of procedure. Post-procedural neurological assessment was performed, showing return to baseline, prior to discharge. The patient was provided with post-procedure discharge instructions, including a section on how to identify potential problems. Should any problems arise concerning this procedure, the patient was given instructions to immediately contact us, at any time, without hesitation. In any case, we plan to contact the patient by telephone for a follow-up status report regarding this interventional procedure.  Comments:  No additional relevant information.  Plan of Care   POC:  NOTE:  1. Not an appropriate candidate for lumbar radiofrequency ablation secondary to body habitus.  2. No more steroid injection any sooner than every 2 months, regardless of location.  3. The patient needs to bring her BMI close to 30.   Interventional therapies: Planned, scheduled, and/or pending: Complete series of 5 bilateral intra-articular Hyalgan knee injections.    Considering:  Diagnostic left suprascapular nerve block #1  Possible left suprascapular nerve RFA     Palliative PRN treatment(s):  Palliative series #3 of 5 bilateral intra-articular Hyalgan knee injections. (No sooner than every 6-8 months) (has had 2 series) Palliative bilateral intra-articular knee joint injection with local anesthetic and steroid. (No more than 1/year, per knee.)  Palliative left intra-articular shoulder joint injection #3 with local anesthetic and steroid. (No more than 1/year.) (Before today, last one done on 05/31/2017)  Palliative bilateral  lumbar facet block #5, (No sooner than every 2-3 months) (No RFA until she brings BMI to <30)  (last one done 03/07/2018)  Palliative right-sided intra-articular hip joint injection #4 with local anesthetic and steroid. (No more than 1/year.) (Last one done 07/07/2017)    Imaging Orders     DG C-Arm 1-60 Min-No Report     DG C-Arm 1-60 Min-No Report  Procedure Orders     KNEE INJECTION     KNEE INJECTION     SHOULDER INJECTION  Medications ordered for procedure: Meds ordered this encounter  Medications  . midazolam (VERSED) 5 MG/5ML injection 1-2 mg    Make sure Flumazenil is available in the pyxis when using this medication. If oversedation occurs, administer 0.2 mg IV over 15 sec. If after 45 sec no response, administer 0.2 mg again over 1 min; may repeat at 1 min intervals; not to exceed 4 doses (1 mg)  . fentaNYL (SUBLIMAZE) injection 25-50 mcg    Make sure Narcan is  available in the pyxis when using this medication. In the event of respiratory depression (RR< 8/min): Titrate NARCAN (naloxone) in increments of 0.1 to 0.2 mg IV at 2-3 minute intervals, until desired degree of reversal.  . lactated ringers infusion 1,000 mL  . lidocaine (PF) (XYLOCAINE) 1 % injection 4 mL  . ropivacaine (PF) 2 mg/mL (0.2%) (NAROPIN) injection 4 mL  . Sodium Hyaluronate SOSY 2 mL  . Sodium Hyaluronate SOSY 2 mL  . lidocaine (XYLOCAINE) 2 % (with pres) injection 400 mg  . ropivacaine (PF) 2 mg/mL (0.2%) (NAROPIN) injection 4 mL  . methylPREDNISolone acetate (DEPO-MEDROL) injection 80 mg   Medications administered: We administered midazolam, fentaNYL, lactated ringers, lidocaine (PF), ropivacaine (PF) 2 mg/mL (0.2%), Sodium Hyaluronate, Sodium Hyaluronate, lidocaine, ropivacaine (PF) 2 mg/mL (0.2%), and methylPREDNISolone acetate.  See the medical record for exact dosing, route, and time of administration.  New Prescriptions   No medications on file   Disposition: Discharge home  Discharge Date &  Time: 03/30/2018; 1441 hrs.   Physician-requested Follow-up: Return for PPE (2 wks) + Procedure (w/ sedation): (B) Hyalgan #5 (Fluoro required).  Future Appointments  Date Time Provider [place]  04/13/2018  9:00 AM Vevelyn Francois, NP [facility] None  04/18/2018  9:30 AM Milinda Pointer, MD [facility] None   Primary Care Physician: Idelle Crouch, MD Location: [facility] Note by: Gaspar Cola, MD Date: 03/30/2018; Time: 5:58 PM  Disclaimer:  Medicine is not an Chief Strategy Officer. The only guarantee in medicine is that nothing is guaranteed. It is important to note that the decision to proceed with this intervention was based on the information collected from the patient. The Data and conclusions were drawn from the patient's questionnaire, the interview, and the physical examination. Because the information was provided in large part by the patient, it cannot be guaranteed that it has not been purposely or unconsciously manipulated. Every effort has been made to obtain as much relevant data as possible for this evaluation. It is important to note that the conclusions that lead to this procedure are derived in large part from the available data. Always take into account that the treatment will also be dependent on availability of resources and existing treatment guidelines, considered by other Pain Management Practitioners as being common knowledge and practice, at the time of the intervention. For Medico-Legal purposes, it is also important to point out that variation in procedural techniques and pharmacological choices are the acceptable norm. The indications, contraindications, technique, and results of the above procedure should only be interpreted and judged by a Board-Certified Interventional Pain Specialist with extensive familiarity and expertise in the same exact procedure and technique.

## 2018-03-30 NOTE — Patient Instructions (Addendum)
____________________________________________________________________________________________  Post-Procedure Discharge Instructions  Instructions:  Apply ice: Fill a plastic sandwich bag with crushed ice. Cover it with a small towel and apply to injection site. Apply for 15 minutes then remove x 15 minutes. Repeat sequence on day of procedure, until you go to bed. The purpose is to minimize swelling and discomfort after procedure.  Apply heat: Apply heat to procedure site starting the day following the procedure. The purpose is to treat any soreness and discomfort from the procedure.  Food intake: Start with clear liquids (like water) and advance to regular food, as tolerated.   Physical activities: Keep activities to a minimum for the first 8 hours after the procedure.   Driving: If you have received any sedation, you are not allowed to drive for 24 hours after your procedure.  Blood thinner: Restart your blood thinner 6 hours after your procedure. (Only for those taking blood thinners)  Insulin: As soon as you can eat, you may resume your normal dosing schedule. (Only for those taking insulin)  Infection prevention: Keep procedure site clean and dry.  Post-procedure Pain Diary: Extremely important that this be done correctly and accurately. Recorded information will be used to determine the next step in treatment.  Pain evaluated is that of treated area only. Do not include pain from an untreated area.  Complete every hour, on the hour, for the initial 8 hours. Set an alarm to help you do this part accurately.  Do not go to sleep and have it completed later. It will not be accurate.  Follow-up appointment: Keep your follow-up appointment after the procedure. Usually 2 weeks for most procedures. (6 weeks in the case of radiofrequency.) Bring you pain diary.   Expect:  From numbing medicine (AKA: Local Anesthetics): Numbness or decrease in pain.  Onset: Full effect within 15  minutes of injected.  Duration: It will depend on the type of local anesthetic used. On the average, 1 to 8 hours.   From steroids: Decrease in swelling or inflammation. Once inflammation is improved, relief of the pain will follow.  Onset of benefits: Depends on the amount of swelling present. The more swelling, the longer it will take for the benefits to be seen. In some cases, up to 10 days.  Duration: Steroids will stay in the system x 2 weeks. Duration of benefits will depend on multiple posibilities including persistent irritating factors.  From procedure: Some discomfort is to be expected once the numbing medicine wears off. This should be minimal if ice and heat are applied as instructed.  Call if:  You experience numbness and weakness that gets worse with time, as opposed to wearing off.  New onset bowel or bladder incontinence. (This applies to Spinal procedures only)  Emergency Numbers:  Durning business hours (Monday - Thursday, 8:00 AM - 4:00 PM) (Friday, 9:00 AM - 12:00 Noon): [phone]  After hours: [phone] ____________________________________________________________________________________________   ____________________________________________________________________________________________  Preparing for your procedure (without sedation)  Instructions: . Oral Intake: Do not eat or drink anything for at least 3 hours prior to your procedure. . Transportation: Unless otherwise stated by your physician, you may drive yourself after the procedure. . Blood Pressure Medicine: Take your blood pressure medicine with a sip of water the morning of the procedure. . Blood thinners:  . Diabetics on insulin: Notify the staff so that you can be scheduled 1st case in the morning. If your diabetes requires high dose insulin, take only  of your normal insulin dose   the morning of the procedure and notify the staff that you have done so. . Preventing infections: Shower  with an antibacterial soap the morning of your procedure.  . Build-up your immune system: Take 1000 mg of Vitamin C with every meal (3 times a day) the day prior to your procedure. . Antibiotics: Inform the staff if you have a condition or reason that requires you to take antibiotics before dental procedures. . Pregnancy: If you are pregnant, call and cancel the procedure. . Sickness: If you have a cold, fever, or any active infections, call and cancel the procedure. . Arrival: You must be in the facility at least 30 minutes prior to your scheduled procedure. . Children: Do not bring any children with you. . Dress appropriately: Bring dark clothing that you would not mind if they get stained. . Valuables: Do not bring any jewelry or valuables.  Procedure appointments are reserved for interventional treatments only. . No Prescription Refills. . No medication changes will be discussed during procedure appointments. . No disability issues will be discussed.  Remember:  Regular Business hours are:  Monday to Thursday 8:00 AM to 4:00 PM  Provider's Schedule: Annsleigh Dragoo, MD:  Procedure days: Tuesday and Thursday 7:30 AM to 4:00 PM  Bilal Lateef, MD:  Procedure days: Monday and Wednesday 7:30 AM to 4:00 PM ____________________________________________________________________________________________    

## 2018-03-30 NOTE — Progress Notes (Signed)
Safety precautions to be maintained throughout the outpatient stay will include: orient to surroundings, keep bed in low position, maintain call bell within reach at all times, provide assistance with transfer out of bed and ambulation.  

## 2018-03-31 ENCOUNTER — Telehealth: Payer: Self-pay | Admitting: *Deleted

## 2018-03-31 NOTE — Telephone Encounter (Signed)
No problems post procedure. 

## 2018-04-12 ENCOUNTER — Other Ambulatory Visit: Payer: Self-pay | Admitting: Internal Medicine

## 2018-04-12 DIAGNOSIS — Z1231 Encounter for screening mammogram for malignant neoplasm of breast: Secondary | ICD-10-CM

## 2018-04-13 ENCOUNTER — Other Ambulatory Visit: Payer: Self-pay

## 2018-04-13 ENCOUNTER — Ambulatory Visit: Payer: Medicare HMO | Attending: Nurse Practitioner | Admitting: Nurse Practitioner

## 2018-04-13 ENCOUNTER — Encounter: Payer: Self-pay | Admitting: Nurse Practitioner

## 2018-04-13 VITALS — BP 135/73 | HR 77 | Temp 99.0°F | Resp 16 | Ht 64.0 in | Wt 383.0 lb

## 2018-04-13 DIAGNOSIS — M47816 Spondylosis without myelopathy or radiculopathy, lumbar region: Secondary | ICD-10-CM

## 2018-04-13 DIAGNOSIS — Z6841 Body Mass Index (BMI) 40.0 and over, adult: Secondary | ICD-10-CM | POA: Insufficient documentation

## 2018-04-13 DIAGNOSIS — Z91013 Allergy to seafood: Secondary | ICD-10-CM | POA: Insufficient documentation

## 2018-04-13 DIAGNOSIS — M25561 Pain in right knee: Secondary | ICD-10-CM | POA: Diagnosis not present

## 2018-04-13 DIAGNOSIS — M25511 Pain in right shoulder: Secondary | ICD-10-CM | POA: Diagnosis not present

## 2018-04-13 DIAGNOSIS — M797 Fibromyalgia: Secondary | ICD-10-CM

## 2018-04-13 DIAGNOSIS — G8929 Other chronic pain: Secondary | ICD-10-CM

## 2018-04-13 DIAGNOSIS — M545 Low back pain: Secondary | ICD-10-CM | POA: Diagnosis not present

## 2018-04-13 DIAGNOSIS — Z79899 Other long term (current) drug therapy: Secondary | ICD-10-CM | POA: Insufficient documentation

## 2018-04-13 DIAGNOSIS — G894 Chronic pain syndrome: Secondary | ICD-10-CM

## 2018-04-13 DIAGNOSIS — M25562 Pain in left knee: Secondary | ICD-10-CM

## 2018-04-13 DIAGNOSIS — M792 Neuralgia and neuritis, unspecified: Secondary | ICD-10-CM

## 2018-04-13 DIAGNOSIS — M25512 Pain in left shoulder: Secondary | ICD-10-CM

## 2018-04-13 DIAGNOSIS — Z883 Allergy status to other anti-infective agents status: Secondary | ICD-10-CM | POA: Diagnosis not present

## 2018-04-13 MED ORDER — MAGNESIUM OXIDE 400 MG PO TABS: 400.0000 mg | ORAL_TABLET | Freq: Two times a day (BID) | ORAL | 2 refills | Status: DC

## 2018-04-13 MED ORDER — OXYCODONE HCL 5 MG PO TABS: 5.0000 mg | ORAL_TABLET | Freq: Every day | ORAL | 0 refills | Status: DC | PRN

## 2018-04-13 MED ORDER — GABAPENTIN 800 MG PO TABS: 800.0000 mg | ORAL_TABLET | Freq: Four times a day (QID) | ORAL | 0 refills | Status: DC

## 2018-04-13 NOTE — Patient Instructions (Addendum)
You have been given Rx for oxycodone to last until 06/12/2018. Gabapentin and Mag Ox has been escribed to your pharmacy.____________________________________________________________________________________________  Medication Rules  Applies to: All patients receiving prescriptions (written or electronic).  Pharmacy of record: Pharmacy where electronic prescriptions will be sent. If written prescriptions are taken to a different pharmacy, please inform the nursing staff. The pharmacy listed in the electronic medical record should be the one where you would like electronic prescriptions to be sent.  Prescription refills: Only during scheduled appointments. Applies to both, written and electronic prescriptions.  NOTE: The following applies primarily to controlled substances (Opioid* Pain Medications).   Patient's responsibilities: 1. Pain Pills: Bring all pain pills to every appointment (except for procedure appointments). 2. Pill Bottles: Bring pills in original pharmacy bottle. Always bring newest bottle. Bring bottle, even if empty. 3. Medication refills: You are responsible for knowing and keeping track of what medications you need refilled. The day before your appointment, write a list of all prescriptions that need to be refilled. Bring that list to your appointment and give it to the admitting nurse. Prescriptions will be written only during appointments. If you forget a medication, it will not be "Called in", "Faxed", or "electronically sent". You will need to get another appointment to get these prescribed. 4. Prescription Accuracy: You are responsible for carefully inspecting your prescriptions before leaving our office. Have the discharge nurse carefully go over each prescription with you, before taking them home. Make sure that your name is accurately spelled, that your address is correct. Check the name and dose of your medication to make sure it is accurate. Check the number of pills, and  the written instructions to make sure they are clear and accurate. Make sure that you are given enough medication to last until your next medication refill appointment. 5. Taking Medication: Take medication as prescribed. Never take more pills than instructed. Never take medication more frequently than prescribed. Taking less pills or less frequently is permitted and encouraged, when it comes to controlled substances (written prescriptions).  6. Inform other Doctors: Always inform, all of your healthcare providers, of all the medications you take. 7. Pain Medication from other Providers: You are not allowed to accept any additional pain medication from any other Doctor or Healthcare provider. There are two exceptions to this rule. (see below) In the event that you require additional pain medication, you are responsible for notifying us, as stated below. 8. Medication Agreement: You are responsible for carefully reading and following our Medication Agreement. This must be signed before receiving any prescriptions from our practice. Safely store a copy of your signed Agreement. Violations to the Agreement will result in no further prescriptions. (Additional copies of our Medication Agreement are available upon request.) 9. Laws, Rules, & Regulations: All patients are expected to follow all Federal and [employer], Rules, [employer]. Ignorance of the Laws does not constitute a valid excuse. The use of any illegal substances is prohibited. 10. Adopted CDC guidelines & recommendations: Target dosing levels will be at or below 60 MME/day. Use of benzodiazepines** is not recommended.  Exceptions: There are only two exceptions to the rule of not receiving pain medications from other Healthcare Providers. 1. Exception #1 (Emergencies): In the event of an emergency (i.e.: accident requiring emergency care), you are allowed to receive additional pain medication. However, you are responsible for: As soon as  you are able, call our office [phone], at any time of the day or night, and leave  a message stating your name, the date and nature of the emergency, and the name and dose of the medication prescribed. In the event that your call is answered by a member of our staff, make sure to document and save the date, time, and the name of the person that took your information.  2. Exception #2 (Planned Surgery): In the event that you are scheduled by another doctor or dentist to have any type of surgery or procedure, you are allowed (for a period no longer than 30 days), to receive additional pain medication, for the acute post-op pain. However, in this case, you are responsible for picking up a copy of our "Post-op Pain Management for Surgeons" handout, and giving it to your surgeon or dentist. This document is available at our office, and does not require an appointment to obtain it. Simply go to our office during business hours (Monday-Thursday from 8:00 AM to 4:00 PM) (Friday 8:00 AM to 12:00 Noon) or if you have a scheduled appointment with [place], prior to your surgery, and ask for it by name. In addition, you will need to provide [place] with your name, name of your surgeon, type of surgery, and date of procedure or surgery.  *Opioid medications include: morphine, codeine, oxycodone, oxymorphone, hydrocodone, hydromorphone, meperidine, tramadol, tapentadol, buprenorphine, fentanyl, methadone. **Benzodiazepine medications include: diazepam (Valium), alprazolam (Xanax), clonazepam (Klonopine), lorazepam (Ativan), clorazepate (Tranxene), chlordiazepoxide (Librium), estazolam (Prosom), oxazepam (Serax), temazepam (Restoril), triazolam (Halcion) (Last updated: 11/10/2017) ____________________________________________________________________________________________   BMI Assessment: Estimated body mass index is 65.74 kg/m as calculated from the following:   Height as of this encounter: 5\' 4"  (1.626 m).   Weight as of  this encounter: 383 lb (173.7 kg).  BMI interpretation table: BMI level Category Range association with higher incidence of chronic pain  <18 kg/m2 Underweight   18.5-24.9 kg/m2 Ideal body weight   25-29.9 kg/m2 Overweight Increased incidence by 20%  30-34.9 kg/m2 Obese (Class I) Increased incidence by 68%  35-39.9 kg/m2 Severe obesity (Class II) Increased incidence by 136%  >40 kg/m2 Extreme obesity (Class III) Increased incidence by 254%   Patient's current BMI Ideal Body weight  Body mass index is 65.74 kg/m. Ideal body weight: 54.7 kg (120 lb 9.5 oz) Adjusted ideal body weight: 102.3 kg (225 lb 8.9 oz)   BMI Readings from Last 4 Encounters:  04/13/18 65.74 kg/m  03/30/18 65.74 kg/m  03/27/18 65.74 kg/m  03/07/18 65.81 kg/m   Wt Readings from Last 4 Encounters:  04/13/18 (!) 383 lb (173.7 kg)  03/30/18 (!) 383 lb (173.7 kg)  03/27/18 (!) 383 lb (173.7 kg)  03/07/18 (!) 383 lb 6.4 oz (173.9 kg)

## 2018-04-13 NOTE — Progress Notes (Signed)
Nursing Pain Medication Assessment:  Safety precautions to be maintained throughout the outpatient stay will include: orient to surroundings, keep bed in low position, maintain call bell within reach at all times, provide assistance with transfer out of bed and ambulation.  Medication Inspection Compliance: Pill count conducted under aseptic conditions, in front of the patient. Neither the pills nor the bottle was removed from the patient's sight at any time. Once count was completed pills were immediately returned to the patient in their original bottle.  Medication: Oxycodone IR Pill/Patch Count: 52 of 150 pills remain Pill/Patch Appearance: Markings consistent with prescribed medication Bottle Appearance: Standard pharmacy container. Clearly labeled. Filled Date: 6 / 4 / 2019 Last Medication intake:  Yesterday

## 2018-04-13 NOTE — Progress Notes (Signed)
Patient's Name: Karen Lewis  MRN: [id number]  Referring Provider: Idelle Crouch, MD  DOB: [birth year]  PCP: Idelle Crouch, MD  DOS: 04/13/2018  Note by: Vevelyn Francois NP  Service setting: Ambulatory outpatient  Specialty: Interventional Pain Management  Location: [facility] (AMB) Pain Management Facility    Patient type: Established    Primary Reason(s) for Visit: Encounter for prescription drug management & post-procedure evaluation of chronic illness with mild to moderate exacerbation(Level of risk: moderate) CC: Back Pain (lower); Shoulder Pain (left); and Knee Pain (bilateral)  HPI  Karen Lewis is a 47 y.o. year old, female patient, who comes today for a post-procedure evaluation and medication management. She has History of asthma; Binge eating disorder; Fibromyalgia; Anxiety, generalized; Insomnia, persistent; Depression, major, recurrent, moderate ([place]); Uncomplicated asthma; Eczema; Gout; Hyperlipidemia; Headache, migraine; Obstructive apnea; Bilateral polycystic ovarian syndrome; Apnea, sleep; Disease of thyroid gland; Avitaminosis D; History of surgical procedure; Type 2 diabetes mellitus without complication, without long-term current use of insulin ([place]); Long term current use of opiate analgesic; Long term prescription opiate use; Opiate use; Encounter for therapeutic drug level monitoring; Opiate dependence ([place]); Hypertension associated with diabetes ([place]); Goiter, nontoxic, multinodular; Chronic knee pain (Primary Source of Pain) (Bilateral) (R>L); Chronic low back pain [facility] source of pain) (Bilateral) (R>L); Lumbar facet syndrome (Bilateral) (R>L); Osteoarthritis; Grade 1 (1.4 cm) Anterolisthesis of L4 over L5; Chronic hip pain (Secondary source of pain) (Right); S/P THR (total hip replacement) (Left); History of methicillin resistant staphylococcus aureus (MRSA); History of bariatric surgery; Hypomagnesemia; Morbid obesity with body mass index (BMI) greater than or equal to 70 in  adult [facility]); History of methicillin resistant Staphylococcus aureus infection; Lumbar spondylosis; Vitamin D insufficiency; Dysphagia, unspecified; Gastroesophageal reflux disease without esophagitis; Chest pain with low risk of acute coronary syndrome; Nontoxic goiter; Other psychoactive substance dependence, uncomplicated (HCC); SOB (shortness of breath) on exertion; Chronic pain syndrome; Neurogenic pain; Upper extremity pain; Chronic shoulder pain (Left); Osteoarthritis of shoulder (Left); Controlled drug dependence ([place]); Osteoarthritis of hip (Right); Hyperlipidemia due to type 2 diabetes mellitus ([place]); Post-operative hypothyroidism; Secondary Osteoarthritis of knee (Bilateral) (R>L); Major depressive disorder, single episode; Depression; Dermatitis; Asthma; Encounter for dental examination; Encounter for dental exam and cleaning w/o abnormal findings; Spondylosis without myelopathy or radiculopathy, lumbosacral region; and History of gastric bypass on their problem list. Her primarily concern today is the Back Pain (lower); Shoulder Pain (left); and Knee Pain (bilateral)  Pain Assessment: Location: Lower Back Radiating: to sides bilaterally Onset: More than a month ago Duration: Chronic pain Quality: Numbness Severity: 3 /10 (subjective, self-reported pain score)  Note: Reported level is compatible with observation.                          Timing: Constant Modifying factors: repositioning, meds, procedures BP: 135/73  HR: 77  Karen Lewis was last seen on 03/31/2018 for a procedure. During today's appointment we reviewed [name]'s post-procedure results, as well as her outpatient medication regimen. She admits that she did well with her shoulder injection. She admits that her right knee is a little painful and she would like to continue with her last Hyalgan. She is aware that her current BMI plays a role in her inability to receive different interventional procedures. She is somewhat discouraged  by this. She admits that she is restricting her calories.   Further details on both, my assessment(s), as well as the proposed treatment plan, please see below.  Controlled Substance  Pharmacotherapy Assessment REMS (Risk Evaluation and Mitigation Strategy)  Analgesic:Oxycodone/APAP 5/325 one every 6 hours when necessary for pain (20 mg/day) MME/day:30 mg/day.   Rise Patience, RN  04/13/2018  9:01 AM  Sign at close encounter Nursing Pain Medication Assessment:  Safety precautions to be maintained throughout the outpatient stay will include: orient to surroundings, keep bed in low position, maintain call bell within reach at all times, provide assistance with transfer out of bed and ambulation.  Medication Inspection Compliance: Pill count conducted under aseptic conditions, in front of the patient. Neither the pills nor the bottle was removed from the patient's sight at any time. Once count was completed pills were immediately returned to the patient in their original bottle.  Medication: Oxycodone IR Pill/Patch Count: 52 of 150 pills remain Pill/Patch Appearance: Markings consistent with prescribed medication Bottle Appearance: Standard pharmacy container. Clearly labeled. Filled Date: 6 / 4 / 2019 Last Medication intake:  Yesterday   Pharmacokinetics: Liberation and absorption (onset of action): WNL Distribution (time to peak effect): WNL Metabolism and excretion (duration of action): WNL         Pharmacodynamics: Desired effects: Analgesia: Ms. Trzcinski reports >50% benefit. Functional ability: Patient reports that medication allows her to accomplish basic ADLs Clinically meaningful improvement in function (CMIF): Sustained CMIF goals met Perceived effectiveness: Described as relatively effective, allowing for increase in activities of daily living (ADL) Undesirable effects: Side-effects or Adverse reactions: None reported Monitoring:  PMP: Online review of the past 10-month period conducted. Compliant with practice rules and regulations Last UDS on record: Summary  Date Value Ref Range Status  01/30/2018 FINAL  Final    Comment:    ==================================================================== TOXASSURE SELECT 13 (MW) ==================================================================== Test                             Result       Flag       Units Drug Present and Declared for Prescription Verification   Oxycodone                      463          EXPECTED   ng/mg creat   Noroxycodone                   1288         EXPECTED   ng/mg creat    Sources of oxycodone include scheduled prescription medications.    Noroxycodone is an expected metabolite of oxycodone. Drug Absent but Declared for Prescription Verification   Codeine                        Not Detected UNEXPECTED ng/mg creat   Butalbital                     Not Detected UNEXPECTED ==================================================================== Test                      Result    Flag   Units      Ref Range   Creatinine              268              mg/dL      >=20 ==================================================================== Declared Medications:  The flagging and interpretation on this report are based on the  following declared medications.  Unexpected results may  arise from  inaccuracies in the declared medications.  **Note: The testing scope of this panel includes these medications:  Butalbital (Fioricet)  Codeine  Oxycodone  **Note: The testing scope of this panel does not include following  reported medications:  Acetaminophen  Acetaminophen (Fioricet)  Acyclovir (Zovirax)  Albuterol (Duoneb)  Albuterol (Proventil)  Amitriptyline  Diphenhydramine (Benadryl)  Estradiol (Estrace)  Famotidine (Pepcid)  Gabapentin  Guaifenesin  Ipratropium (Duoneb)  Ketoconazole (Nizoral)  Levocetirizine (Xyzal)  Levothyroxine (Synthroid)  Magnesium Oxide  Meclizine   Medroxyprogesterone (Depo-Provera)  Metformin  Metoprolol  Montelukast (Singulair)  Multivitamin  Naloxone  Nitrofurantoin (Macrobid)  Omeprazole (Prilosec)  Phentermine  Pioglitazone (Actos)  Quetiapine (Seroquel)  Ranitidine (Zantac)  Rosuvastatin (Crestor)  Sertraline (Zoloft)  Spironolactone (Aldactone)  Tizanidine (Zanaflex)  Triamcinolone (Kenalog)  Triamcinolone (Nasacort)  Zonisamide (Zonegran) ==================================================================== For clinical consultation, please call [phone]. ====================================================================    UDS interpretation: Compliant          Medication Assessment Form: Reviewed. Patient indicates being compliant with therapy Treatment compliance: Compliant Risk Assessment Profile: Aberrant behavior: See prior evaluations. None observed or detected today Comorbid factors increasing risk of overdose: See prior notes. No additional risks detected today Risk of substance use disorder (SUD): Low Opioid Risk Tool - 04/13/18 0858      Family History of Substance Abuse   Alcohol  Positive Female    Illegal Drugs  Positive Female    Rx Drugs  Negative      Personal History of Substance Abuse   Alcohol  Negative    Illegal Drugs  Negative    Rx Drugs  Negative      Age   Age between 56-45 years   No      Psychological Disease   Psychological Disease  Negative    Depression  Positive      Total Score   Opioid Risk Tool Scoring  4    Opioid Risk Interpretation  Moderate Risk      ORT Scoring interpretation table:  Score <3 = Low Risk for SUD  Score between 4-7 = Moderate Risk for SUD  Score >8 = High Risk for Opioid Abuse   Risk Mitigation Strategies:  Patient Counseling: Covered Patient-Prescriber Agreement (PPA): Present and active  Notification to other healthcare providers: Done  Pharmacologic Plan: No change in therapy, at this time.             Post-Procedure  Assessment  03/30/2018 Procedure: Bilateral Hyalgan knee injections and left shoulder injection Pre-procedure pain score:  3/10 Post-procedure pain score: 0/10         Influential Factors: BMI: 65.74 kg/m Intra-procedural challenges: None observed.         Assessment challenges: None detected.              Reported side-effects: None.        Post-procedural adverse reactions or complications: None reported         Sedation: Please see nurses note. When no sedatives are used, the analgesic levels obtained are directly associated to the effectiveness of the local anesthetics. However, when sedation is provided, the level of analgesia obtained during the initial 1 hour following the intervention, is believed to be the result of a combination of factors. These factors may include, but are not limited to: 1. The effectiveness of the local anesthetics used. 2. The effects of the analgesic(s) and/or anxiolytic(s) used. 3. The degree of discomfort experienced by the patient at the time of the procedure. 4. The  patients ability and reliability in recalling and recording the events. 5. The presence and influence of possible secondary gains and/or psychosocial factors. Reported result: Relief experienced during the 1st hour after the procedure: 100 % (Ultra-Short Term Relief)            Interpretative annotation: Clinically appropriate result. Analgesia during this period is likely to be Local Anesthetic and/or IV Sedative (Analgesic/Anxiolytic) related.          Effects of local anesthetic: The analgesic effects attained during this period are directly associated to the localized infiltration of local anesthetics and therefore cary significant diagnostic value as to the etiological location, or anatomical origin, of the pain. Expected duration of relief is directly dependent on the pharmacodynamics of the local anesthetic used. Long-acting (4-6 hours) anesthetics used.  Reported result: Relief during the  next 4 to 6 hour after the procedure: 100 % (Short-Term Relief)            Interpretative annotation: Clinically appropriate result. Analgesia during this period is likely to be Local Anesthetic-related.          Long-term benefit: Defined as the period of time past the expected duration of local anesthetics (1 hour for short-acting and 4-6 hours for long-acting). With the possible exception of prolonged sympathetic blockade from the local anesthetics, benefits during this period are typically attributed to, or associated with, other factors such as analgesic sensory neuropraxia, antiinflammatory effects, or beneficial biochemical changes provided by agents other than the local anesthetics.  Reported result: Extended relief following procedure: 100 %(75% relief in R knee; otherwise 100% relief in L knee and L shoulder) (Long-Term Relief)            Interpretative annotation: Clinically appropriate result. Good relief. No permanent benefit expected. Inflammation plays a part in the etiology to the pain.          Current benefits: Defined as reported results that persistent at this point in time.   Analgesia: <75 %            Function: [name] reports improvement in function ROM: Ms. Tokarski reports improvement in ROM Interpretative annotation: Ongoing benefit. Therapeutic success. Effective diagnostic intervention.          Interpretation: Results would suggest a successful diagnostic intervention.                  Plan:  Please see "Plan of Care" for details.                Laboratory Chemistry  Inflammation Markers (CRP: Acute Phase) (ESR: Chronic Phase) Lab Results  Component Value Date   CRP <0.5 11/24/2015   ESRSEDRATE 20 01/30/2018                         Rheumatology Markers No results found for: RF, ANA, LABURIC, URICUR, LYMEIGGIGMAB, LYMEABIGMQN, HLAB27                      Renal Function Markers Lab Results  Component Value Date   BUN 8 11/24/2015   CREATININE 0.80 05/11/2016    GFRAA >60 11/24/2015   GFRNONAA >60 11/24/2015                             Hepatic Function Markers Lab Results  Component Value Date   AST 23 11/24/2015   ALT 20 11/24/2015   ALBUMIN 3.7 11/24/2015  ALKPHOS 81 11/24/2015                        Electrolytes Lab Results  Component Value Date   NA 139 11/24/2015   K 4.3 11/24/2015   CL 105 11/24/2015   CALCIUM 8.6 (L) 11/24/2015   MG 1.6 01/30/2018                        Neuropathy Markers Lab Results  Component Value Date   VITAMINB12 303 11/24/2015                        Bone Pathology Markers Lab Results  Component Value Date   VD25OH 27.4 (L) 11/24/2015   VD125OH2TOT 41.5 11/24/2015                         Coagulation Parameters Lab Results  Component Value Date   INR 0.95 05/07/2015   LABPROT 12.9 05/07/2015   APTT 26 05/07/2015   PLT 337 11/12/2015                        Cardiovascular Markers Lab Results  Component Value Date   HGB 12.4 11/12/2015   HCT 38.0 11/12/2015                         CA Markers No results found for: CEA, CA125, LABCA2                      Note: Lab results reviewed.  Recent Diagnostic Imaging Results  DG C-Arm 1-60 Min-No Report Fluoroscopy was utilized by the requesting physician.  No radiographic  interpretation.  DG C-Arm 1-60 Min-No Report Fluoroscopy was utilized by the requesting physician.  No radiographic  interpretation.   Complexity Note: Imaging results reviewed. Results shared with Ms. Brigitte Pulse, using Layman's terms.                         Meds   Current Outpatient Medications:  .  Acetaminophen 500 MG coapsule, Take 500 mg by mouth every 6 (six) hours as needed. , Disp: , Rfl:  .  acyclovir (ZOVIRAX) 800 MG tablet, Take 800 mg by mouth 3 times/day as needed-between meals & bedtime. Reported on 11/12/2015, Disp: , Rfl:  .  albuterol (PROVENTIL HFA;VENTOLIN HFA) 108 (90 BASE) MCG/ACT inhaler, Inhale 2 puffs into the lungs every 6 (six) hours as needed  for wheezing or shortness of breath., Disp: , Rfl:  .  amitriptyline (ELAVIL) 100 MG tablet, , Disp: , Rfl: 3 .  Biotin 10 MG CAPS, , Disp: , Rfl:  .  Blood Glucose Calibration (ACCU-CHEK AVIVA) SOLN, , Disp: , Rfl:  .  Blood Glucose Monitoring Suppl (ACCU-CHEK AVIVA PLUS) w/Device KIT, , Disp: , Rfl:  .  butalbital-acetaminophen-caffeine (FIORICET, ESGIC) 50-325-40 MG tablet, 1 tablet every 6 (six) hours as needed. , Disp: , Rfl:  .  diphenhydrAMINE (BENADRYL) 25 mg capsule, Take by mouth., Disp: , Rfl:  .  estradiol (ESTRACE) 0.1 MG/GM vaginal cream, , Disp: , Rfl:  .  famotidine (PEPCID) 20 MG tablet, Take by mouth., Disp: , Rfl:  .  guaiFENesin-codeine 100-10 MG/5ML syrup, Take by mouth., Disp: , Rfl:  .  ipratropium-albuterol (DUONEB) 0.5-2.5 (3) MG/3ML SOLN, 3 mLs as needed. , Disp: ,  Rfl:  .  ketoconazole (NIZORAL) 2 % cream, APPLY AS DIRECTED TO AFFECTED AREA TWICE DAILY AS NEEDED, Disp: , Rfl:  .  ketorolac (TORADOL) 10 MG tablet, Take 1 tablet (10 mg total) by mouth every 6 (six) hours as needed., Disp: 20 tablet, Rfl: 0 .  Lancet Devices (SIMPLE DIAGNOSTICS LANCING DEV) MISC, Use 1 each as directed. Accu Chek Softclix. DX: E11.8, Disp: , Rfl:  .  levocetirizine (XYZAL) 5 MG tablet, Take 5 mg by mouth every evening. , Disp: , Rfl:  .  levothyroxine (SYNTHROID, LEVOTHROID) 125 MCG tablet, Take 250 mcg by mouth daily before breakfast., Disp: , Rfl:  .  meclizine (ANTIVERT) 25 MG tablet, Take 1 tablet (25 mg total) by mouth 3 (three) times daily as needed for dizziness., Disp: 30 tablet, Rfl: 0 .  medroxyPROGESTERone (DEPO-PROVERA) 150 MG/ML injection, Inject 1 mL (150 mg total) into the muscle every 3 (three) months., Disp: 1 mL, Rfl: 3 .  metFORMIN (GLUCOPHAGE) 1000 MG tablet, Take 1,000 mg by mouth 2 (two) times daily with a meal. , Disp: , Rfl:  .  metoprolol (LOPRESSOR) 50 MG tablet, Take 75 mg by mouth 2 (two) times daily. , Disp: , Rfl:  .  montelukast (SINGULAIR) 10 MG tablet, Take  10 mg by mouth at bedtime. , Disp: , Rfl:  .  Multiple Vitamin (MULTIVITAMIN WITH MINERALS) TABS tablet, Take 2 tablets by mouth daily. Reported on 11/12/2015, Disp: , Rfl:  .  naloxone (NARCAN) nasal spray 4 mg/0.1 mL, Spray into one nostril. Repeat with second device into other nostril after 2-3 minutes if no or minimal response., Disp: 1 kit, Rfl: 0 .  nitrofurantoin, macrocrystal-monohydrate, (MACROBID) 100 MG capsule, , Disp: , Rfl:  .  omeprazole (PRILOSEC) 40 MG capsule, Take 40 mg by mouth 2 (two) times daily. , Disp: , Rfl:  .  pioglitazone (ACTOS) 15 MG tablet, , Disp: , Rfl:  .  QUEtiapine (SEROQUEL) 100 MG tablet, Take 1 tablet (100 mg total) by mouth at bedtime., Disp: 90 tablet, Rfl: 0 .  ranitidine (ZANTAC) 150 MG capsule, Take by mouth., Disp: , Rfl:  .  rosuvastatin (CRESTOR) 40 MG tablet, Take 40 mg by mouth daily. , Disp: , Rfl:  .  sertraline (ZOLOFT) 100 MG tablet, Take 2 tablets (200 mg total) by mouth daily., Disp: 180 tablet, Rfl: 2 .  spironolactone (ALDACTONE) 25 MG tablet, Take 37.5 mg by mouth 2 (two) times daily. , Disp: , Rfl:  .  tiZANidine (ZANAFLEX) 4 MG tablet, 4 mg at bedtime. , Disp: , Rfl:  .  triamcinolone (KENALOG) 0.1 % paste, Apply 1 application topically 2 (two) times daily. , Disp: , Rfl:  .  triamcinolone (NASACORT AQ) 55 MCG/ACT AERO nasal inhaler, 2 sprays by Nasal route 2 (two) times daily., Disp: , Rfl:  .  TRUE METRIX BLOOD GLUCOSE TEST test strip, , Disp: , Rfl:  .  ULTICARE ALCOHOL SWABS PADS, Apply 1 each topically 2 (two) times daily. Use when checking BG. DX: E11.8, Disp: , Rfl:  .  zolpidem (AMBIEN) 10 MG tablet, Take 10 mg by mouth at bedtime as needed for sleep., Disp: , Rfl:  .  zonisamide (ZONEGRAN) 50 MG capsule, Take 150 mg by mouth daily. 3 tabs at bedtime, Disp: , Rfl:  .  gabapentin (NEURONTIN) 800 MG tablet, Take 1 tablet (800 mg total) by mouth every 6 (six) hours., Disp: 360 tablet, Rfl: 0 .  levothyroxine (SYNTHROID, LEVOTHROID) 200  MCG tablet, Take  on an empty stomach each morning before breakfast. Total dose 250 mcg daily., Disp: , Rfl:  .  levothyroxine (SYNTHROID, LEVOTHROID) 50 MCG tablet, Take 50 mcg by mouth daily before breakfast. , Disp: , Rfl:  .  magnesium oxide (MAG-OX) 400 MG tablet, Take 1 tablet (400 mg total) by mouth 2 (two) times daily., Disp: 60 tablet, Rfl: 2 .  oxyCODONE (OXY IR/ROXICODONE) 5 MG immediate release tablet, Take 1 tablet (5 mg total) by mouth 5 (five) times daily as needed for severe pain., Disp: 150 tablet, Rfl: 0 .  oxyCODONE (OXY IR/ROXICODONE) 5 MG immediate release tablet, Take 1 tablet (5 mg total) by mouth 5 (five) times daily as needed for severe pain., Disp: 150 tablet, Rfl: 0 .  [START ON 05/13/2018] oxyCODONE (OXY IR/ROXICODONE) 5 MG immediate release tablet, Take 1 tablet (5 mg total) by mouth 5 (five) times daily as needed for severe pain., Disp: 150 tablet, Rfl: 0 .  phentermine 15 MG capsule, Take 37.5 mg by mouth. , Disp: , Rfl:  .  ranitidine (ZANTAC) 150 MG capsule, Take 150 mg by mouth every evening. , Disp: , Rfl:   ROS  Constitutional: Denies any fever or chills Gastrointestinal: No reported hemesis, hematochezia, vomiting, or acute GI distress Musculoskeletal: Denies any acute onset joint swelling, redness, loss of ROM, or weakness Neurological: No reported episodes of acute onset apraxia, aphasia, dysarthria, agnosia, amnesia, paralysis, loss of coordination, or loss of consciousness  Allergies  Ms. Kachmar is allergic to bactrim [sulfamethoxazole-trimethoprim]; omalizumab; ciprofloxacin; shellfish allergy; aspirin; clindamycin; motrin [ibuprofen]; nsaids; other; and sulfa antibiotics.  [place]  Drug: Ms. Capili  reports that she does not use drugs. Alcohol:  reports that she does not drink alcohol. Tobacco:  reports that she has never smoked. She has never used smokeless tobacco. Medical:  has a past medical history of Anemia, Anginal pain ([place]), Anxiety, Arthralgia of hip  (07/29/2015), Arthritis, Arthritis, degenerative (07/29/2015), Asthma, Cephalalgia (07/25/2014), Dependence on unknown drug ([place]), Depression, Diabetes mellitus without complication ([place]), Dysrhythmia, Eczema, Fibromyalgia, Gastritis, GERD (gastroesophageal reflux disease), Gonalgia (07/29/2015), Gout, H/O cardiovascular disorder (03/10/2015), H/O surgical procedure (12/05/2012), H/O thyroid disease (03/10/2015), Headache, Herpes, History of artificial joint (07/29/2015), History of hiatal hernia, Hypertension, Hypomagnesemia, Hypothyroidism, LBP (low back pain) (07/29/2015), Neuromuscular disorder ([place]), Obesity, PCOS (polycystic ovarian syndrome), Primary osteoarthritis of both knees (07/29/2015), Sleep apnea, and Thyroid nodule (bilateral). Surgical: Ms. Mennella  has a past surgical history that includes Laparoscopic partial gastrectomy; Shoulder arthroscopy (Right); Joint replacement (Left, hip); Carpal tunnel release (Bilateral); Diagnostic laparoscopy; Cholecystectomy; Trigger finger release (Right); Thyroidectomy (N/A, 11/12/2015); left trigger finger; Roux-en-Y Gastric Bypass (06/03/2017); and Hiatal hernia repair. Family: family history includes Alcohol abuse in her father and mother; Anxiety disorder in her father and mother; Breast cancer in her paternal aunt; COPD in her father; Depression in her brother, father, and mother; Diabetes in her brother, father, and mother; Hypertension in her brother, father, and mother; Kidney cancer in her mother; Kidney failure in her father; Post-traumatic stress disorder in her father; Sleep apnea in her brother, father, and mother.  Constitutional Exam  General appearance: alert, cooperative and morbidly obese Vitals:   04/13/18 0842  BP: 135/73  Pulse: 77  Resp: 16  Temp: 99 F (37.2 C)  TempSrc: Oral  SpO2: 98%  Weight: (!) 383 lb (173.7 kg)  Height: '5\' 4"'  (1.626 m)  Psych/Mental status: Alert, oriented x 3 (person, place, & time)       Eyes:  PERLA Respiratory: No evidence of  acute respiratory distress  Cervical Spine Area Exam  Skin & Axial Inspection: No masses, redness, edema, swelling, or associated skin lesions Alignment: Symmetrical Functional ROM: Unrestricted ROM      Stability: No instability detected Muscle Tone/Strength: Functionally intact. No obvious neuro-muscular anomalies detected. Sensory (Neurological): Unimpaired Palpation: No palpable anomalies              Upper Extremity (UE) Exam    Side: Right upper extremity  Side: Left upper extremity  Skin & Extremity Inspection: Skin color, temperature, and hair growth are WNL. No peripheral edema or cyanosis. No masses, redness, swelling, asymmetry, or associated skin lesions. No contractures.  Skin & Extremity Inspection: Skin color, temperature, and hair growth are WNL. No peripheral edema or cyanosis. No masses, redness, swelling, asymmetry, or associated skin lesions. No contractures.  Functional ROM: Unrestricted ROM          Functional ROM: Adequate ROM          Muscle Tone/Strength: Functionally intact. No obvious neuro-muscular anomalies detected.  Muscle Tone/Strength: Functionally intact. No obvious neuro-muscular anomalies detected.  Sensory (Neurological): Unimpaired          Sensory (Neurological): Unimpaired          Palpation: No palpable anomalies              Palpation: No palpable anomalies                   Thoracic Spine Area Exam  Skin & Axial Inspection: No masses, redness, or swelling Alignment: Symmetrical Functional ROM: Unrestricted ROM Stability: No instability detected Muscle Tone/Strength: Functionally intact. No obvious neuro-muscular anomalies detected. Sensory (Neurological): Unimpaired Muscle strength & Tone: No palpable anomalies  Lumbar Spine Area Exam  Skin & Axial Inspection: No masses, redness, or swelling Alignment: Symmetrical Functional ROM: Unrestricted ROM       Stability: No instability detected Muscle  Tone/Strength: Functionally intact. No obvious neuro-muscular anomalies detected. Sensory (Neurological): Unimpaired Palpation: No palpable anomalies       Provocative Tests: Lumbar Hyperextension/rotation test: deferred today       Lumbar quadrant test (Kemp's test): deferred today       Lumbar Lateral bending test: deferred today       Patrick's Maneuver: deferred today                   FABER test: deferred today                   Thigh-thrust test: deferred today       S-I compression test: deferred today       S-I distraction test: deferred today        Gait & Posture Assessment  Ambulation: Patient ambulates using a wheel chairscooter Gait: Relatively normal for age and body habitus Posture: WNL   Lower Extremity Exam    Side: Right lower extremity  Side: Left lower extremity  Stability: No instability observed          Stability: No instability observed          Skin & Extremity Inspection: Skin color, temperature, and hair growth are WNL. No peripheral edema or cyanosis. No masses, redness, swelling, asymmetry, or associated skin lesions. No contractures.  Skin & Extremity Inspection: Skin color, temperature, and hair growth are WNL. No peripheral edema or cyanosis. No masses, redness, swelling, asymmetry, or associated skin lesions. No contractures.  Functional ROM: Unrestricted ROM for knee joint  Functional ROM: Unrestricted ROM                  Muscle Tone/Strength: Functionally intact. No obvious neuro-muscular anomalies detected.  Muscle Tone/Strength: Functionally intact. No obvious neuro-muscular anomalies detected.  Sensory (Neurological): Unimpaired  Sensory (Neurological): Unimpaired  Palpation: Tender  Palpation: No palpable anomalies   Assessment  Primary Diagnosis & Pertinent Problem List: The primary encounter diagnosis was Lumbar spondylosis. Diagnoses of Chronic knee pain (Primary Source of Pain) (Bilateral) (R>L), Chronic shoulder pain (Left),  Fibromyalgia, Chronic pain syndrome, Neurogenic pain, and Hypomagnesemia were also pertinent to this visit.  Status Diagnosis  Controlled Controlled Controlled 1. Lumbar spondylosis   2. Chronic knee pain (Primary Source of Pain) (Bilateral) (R>L)   3. Chronic shoulder pain (Left)   4. Fibromyalgia   5. Chronic pain syndrome   6. Neurogenic pain   7. Hypomagnesemia     Problems updated and reviewed during this visit: No problems updated. Plan of Care  Pharmacotherapy (Medications Ordered): Meds ordered this encounter  Medications  . oxyCODONE (OXY IR/ROXICODONE) 5 MG immediate release tablet    Sig: Take 1 tablet (5 mg total) by mouth 5 (five) times daily as needed for severe pain.    Dispense:  150 tablet    Refill:  0    Fill one day early if pharmacy is closed on scheduled refill date.  Do not fill until: 05/13/2018 To last until:06/12/2018    Order Specific Question:   Supervising Provider    Answer:   Milinda Pointer [phone]  . gabapentin (NEURONTIN) 800 MG tablet    Sig: Take 1 tablet (800 mg total) by mouth every 6 (six) hours.    Dispense:  360 tablet    Refill:  0    Do not place this medication, or any other prescription from our practice, on "Automatic Refill". Patient may have prescription filled one day early if pharmacy is closed on scheduled refill date.    Order Specific Question:   Supervising Provider    Answer:   Milinda Pointer [phone]  . magnesium oxide (MAG-OX) 400 MG tablet    Sig: Take 1 tablet (400 mg total) by mouth 2 (two) times daily.    Dispense:  60 tablet    Refill:  2    Do not place this medication, or any other prescription from our practice, on "Automatic Refill". Patient may have prescription filled one day early if pharmacy is closed on scheduled refill date.    Order Specific Question:   Supervising Provider    Answer:   Milinda Pointer [[id number]]   New Prescriptions   No medications on file   Medications administered  today: Ashlynn A. Robertshaw had no medications administered during this visit. Lab-work, procedure(s), and/or referral(s): No orders of the defined types were placed in this encounter.  Imaging and/or referral(s): None   Interventional therapies: Planned, scheduled, and/or pending: Hyalgan knee injection #5 as scheduled   Considering:  Series of 5 Hyalgan knee injections    Palliative PRN treatment(s):  Intra-articular Hyalgan knee injections.  Palliative bilateral genicular nerve block  Palliative bilateral lumbar facet block      Provider-requested follow-up: Return in about 3 months (around 07/14/2018) for MedMgmt with Me Donella Stade Edison Pace).  Future Appointments  Date Time Provider [place]  04/27/2018  8:30 AM Milinda Pointer, MD [facility] None  05/04/2018 10:20 AM [facility]  07/17/2018 10:30 AM Vevelyn Francois, NP [facility] None   Primary Care Physician: Doy Hutching,  Leonie Douglas, MD Location: [facility] Note by: Vevelyn Francois NP Date: 04/13/2018; Time: 1:50 PM  Pain Score Disclaimer: We use the NRS-11 scale. This is a self-reported, subjective measurement of pain severity with only modest accuracy. It is used primarily to identify changes within a particular patient. It must be understood that outpatient pain scales are significantly less accurate that those used for research, where they can be applied under ideal controlled circumstances with minimal exposure to variables. In reality, the score is likely to be a combination of pain intensity and pain affect, where pain affect describes the degree of emotional arousal or changes in action readiness caused by the sensory experience of pain. Factors such as social and work situation, setting, emotional state, anxiety levels, expectation, and prior pain experience may influence pain perception and show large inter-individual differences that may also be affected by time  variables.  Patient instructions provided during this appointment: Patient Instructions   You have been given Rx for oxycodone to last until 06/12/2018. Gabapentin and Mag Ox has been escribed to your pharmacy.____________________________________________________________________________________________  Medication Rules  Applies to: All patients receiving prescriptions (written or electronic).  Pharmacy of record: Pharmacy where electronic prescriptions will be sent. If written prescriptions are taken to a different pharmacy, please inform the nursing staff. The pharmacy listed in the electronic medical record should be the one where you would like electronic prescriptions to be sent.  Prescription refills: Only during scheduled appointments. Applies to both, written and electronic prescriptions.  NOTE: The following applies primarily to controlled substances (Opioid* Pain Medications).   Patient's responsibilities: 1. Pain Pills: Bring all pain pills to every appointment (except for procedure appointments). 2. Pill Bottles: Bring pills in original pharmacy bottle. Always bring newest bottle. Bring bottle, even if empty. 3. Medication refills: You are responsible for knowing and keeping track of what medications you need refilled. The day before your appointment, write a list of all prescriptions that need to be refilled. Bring that list to your appointment and give it to the admitting nurse. Prescriptions will be written only during appointments. If you forget a medication, it will not be "Called in", "Faxed", or "electronically sent". You will need to get another appointment to get these prescribed. 4. Prescription Accuracy: You are responsible for carefully inspecting your prescriptions before leaving our office. Have the discharge nurse carefully go over each prescription with you, before taking them home. Make sure that your name is accurately spelled, that your address is correct. Check the  name and dose of your medication to make sure it is accurate. Check the number of pills, and the written instructions to make sure they are clear and accurate. Make sure that you are given enough medication to last until your next medication refill appointment. 5. Taking Medication: Take medication as prescribed. Never take more pills than instructed. Never take medication more frequently than prescribed. Taking less pills or less frequently is permitted and encouraged, when it comes to controlled substances (written prescriptions).  6. Inform other Doctors: Always inform, all of your healthcare providers, of all the medications you take. 7. Pain Medication from other Providers: You are not allowed to accept any additional pain medication from any other Doctor or Healthcare provider. There are two exceptions to this rule. (see below) In the event that you require additional pain medication, you are responsible for notifying us, as stated below. 8. Medication Agreement: You are responsible for carefully reading and following our Medication Agreement. This  must be signed before receiving any prescriptions from our practice. Safely store a copy of your signed Agreement. Violations to the Agreement will result in no further prescriptions. (Additional copies of our Medication Agreement are available upon request.) 9. Laws, Rules, & Regulations: All patients are expected to follow all Federal and [employer], Rules, [employer]. Ignorance of the Laws does not constitute a valid excuse. The use of any illegal substances is prohibited. 10. Adopted CDC guidelines & recommendations: Target dosing levels will be at or below 60 MME/day. Use of benzodiazepines** is not recommended.  Exceptions: There are only two exceptions to the rule of not receiving pain medications from other Healthcare Providers. 1. Exception #1 (Emergencies): In the event of an emergency (i.e.: accident requiring emergency care), you  are allowed to receive additional pain medication. However, you are responsible for: As soon as you are able, call our office [phone], at any time of the day or night, and leave a message stating your name, the date and nature of the emergency, and the name and dose of the medication prescribed. In the event that your call is answered by a member of our staff, make sure to document and save the date, time, and the name of the person that took your information.  2. Exception #2 (Planned Surgery): In the event that you are scheduled by another doctor or dentist to have any type of surgery or procedure, you are allowed (for a period no longer than 30 days), to receive additional pain medication, for the acute post-op pain. However, in this case, you are responsible for picking up a copy of our "Post-op Pain Management for Surgeons" handout, and giving it to your surgeon or dentist. This document is available at our office, and does not require an appointment to obtain it. Simply go to our office during business hours (Monday-Thursday from 8:00 AM to 4:00 PM) (Friday 8:00 AM to 12:00 Noon) or if you have a scheduled appointment with [place], prior to your surgery, and ask for it by name. In addition, you will need to provide [place] with your name, name of your surgeon, type of surgery, and date of procedure or surgery.  *Opioid medications include: morphine, codeine, oxycodone, oxymorphone, hydrocodone, hydromorphone, meperidine, tramadol, tapentadol, buprenorphine, fentanyl, methadone. **Benzodiazepine medications include: diazepam (Valium), alprazolam (Xanax), clonazepam (Klonopine), lorazepam (Ativan), clorazepate (Tranxene), chlordiazepoxide (Librium), estazolam (Prosom), oxazepam (Serax), temazepam (Restoril), triazolam (Halcion) (Last updated: 11/10/2017) ____________________________________________________________________________________________   BMI Assessment: Estimated body mass index is 65.74 kg/m as  calculated from the following:   Height as of this encounter: '5\' 4"'  (1.626 m).   Weight as of this encounter: 383 lb (173.7 kg).  BMI interpretation table: BMI level Category Range association with higher incidence of chronic pain  <18 kg/m2 Underweight   18.5-24.9 kg/m2 Ideal body weight   25-29.9 kg/m2 Overweight Increased incidence by 20%  30-34.9 kg/m2 Obese (Class I) Increased incidence by 68%  35-39.9 kg/m2 Severe obesity (Class II) Increased incidence by 136%  >40 kg/m2 Extreme obesity (Class III) Increased incidence by 254%   Patient's current BMI Ideal Body weight  Body mass index is 65.74 kg/m. Ideal body weight: 54.7 kg (120 lb 9.5 oz) Adjusted ideal body weight: 102.3 kg (225 lb 8.9 oz)   BMI Readings from Last 4 Encounters:  04/13/18 65.74 kg/m  03/30/18 65.74 kg/m  03/27/18 65.74 kg/m  03/07/18 65.81 kg/m   Wt Readings from Last 4 Encounters:  04/13/18 (!) 383 lb (173.7 kg)  03/30/18 Marland Kitchen)  383 lb (173.7 kg)  03/27/18 (!) 383 lb (173.7 kg)  03/07/18 (!) 383 lb 6.4 oz (173.9 kg)

## 2018-04-17 DIAGNOSIS — M6281 Muscle weakness (generalized): Secondary | ICD-10-CM | POA: Diagnosis not present

## 2018-04-17 DIAGNOSIS — I10 Essential (primary) hypertension: Secondary | ICD-10-CM | POA: Diagnosis not present

## 2018-04-17 DIAGNOSIS — Z9884 Bariatric surgery status: Secondary | ICD-10-CM | POA: Diagnosis not present

## 2018-04-17 DIAGNOSIS — F329 Major depressive disorder, single episode, unspecified: Secondary | ICD-10-CM | POA: Diagnosis not present

## 2018-04-17 DIAGNOSIS — G43B1 Ophthalmoplegic migraine, intractable: Secondary | ICD-10-CM | POA: Diagnosis not present

## 2018-04-17 DIAGNOSIS — E119 Type 2 diabetes mellitus without complications: Secondary | ICD-10-CM | POA: Diagnosis not present

## 2018-04-17 DIAGNOSIS — M150 Primary generalized (osteo)arthritis: Secondary | ICD-10-CM | POA: Diagnosis not present

## 2018-04-17 DIAGNOSIS — Z713 Dietary counseling and surveillance: Secondary | ICD-10-CM | POA: Diagnosis not present

## 2018-04-18 ENCOUNTER — Ambulatory Visit: Payer: Medicare HMO | Admitting: Pain Medicine

## 2018-04-18 DIAGNOSIS — I10 Essential (primary) hypertension: Secondary | ICD-10-CM | POA: Diagnosis not present

## 2018-04-18 DIAGNOSIS — E785 Hyperlipidemia, unspecified: Secondary | ICD-10-CM | POA: Diagnosis not present

## 2018-04-18 DIAGNOSIS — N390 Urinary tract infection, site not specified: Secondary | ICD-10-CM | POA: Diagnosis not present

## 2018-04-18 DIAGNOSIS — E119 Type 2 diabetes mellitus without complications: Secondary | ICD-10-CM | POA: Diagnosis not present

## 2018-04-18 DIAGNOSIS — E1169 Type 2 diabetes mellitus with other specified complication: Secondary | ICD-10-CM | POA: Diagnosis not present

## 2018-04-18 DIAGNOSIS — E1159 Type 2 diabetes mellitus with other circulatory complications: Secondary | ICD-10-CM | POA: Diagnosis not present

## 2018-04-26 DIAGNOSIS — Z91013 Allergy to seafood: Secondary | ICD-10-CM | POA: Insufficient documentation

## 2018-04-26 DIAGNOSIS — Z888 Allergy status to other drugs, medicaments and biological substances status: Secondary | ICD-10-CM | POA: Insufficient documentation

## 2018-04-26 NOTE — Progress Notes (Signed)
Patient's Name: Karen Lewis  MRN: [id number]  Referring Provider: Idelle Crouch, MD  DOB: 08/22/1971  PCP: Idelle Crouch, MD  DOS: 04/27/2018  Note by: Gaspar Cola, MD  Service setting: Ambulatory outpatient  Specialty: Interventional Pain Management  Patient type: Established  Location: [facility] (AMB) Pain Management Facility  Visit type: Interventional Procedure   Primary Reason for Visit: Interventional Pain Management Treatment. CC: Back Pain  Procedure:          Anesthesia, Analgesia, Anxiolysis:  Type: Therapeutic Intra-Articular Hyalgan Knee Injection #5  Region: Lateral infrapatellar Knee Region Level: Knee Joint Laterality: Bilateral  Type: Moderate (Conscious) Sedation combined with Local Anesthesia Indication(s): Analgesia and Anxiety Local Anesthetic: Lidocaine 1-2% Route: Intravenous (IV) IV Access: Secured Sedation: Meaningful verbal contact was maintained at all times during the procedure    Indications: 1. Secondary Osteoarthritis of knee (Bilateral) (R>L)   2. Chronic knee pain (Primary Source of Pain) (Bilateral) (R>L)   3. Morbid obesity with body mass index (BMI) greater than or equal to 70 in adult ([place])   4. Allergy to iodine   5. H/O allergy to shellfish    Pain Score: Pre-procedure: 5 /10 Post-procedure: 0-No pain/10  Pre-op Assessment:  Karen Lewis is a 47 y.o. (year old), female patient, seen today for interventional treatment. She  has a past surgical history that includes Laparoscopic partial gastrectomy; Shoulder arthroscopy (Right); Joint replacement (Left, hip); Carpal tunnel release (Bilateral); Diagnostic laparoscopy; Cholecystectomy; Trigger finger release (Right); Thyroidectomy (N/A, 11/12/2015); left trigger finger; Roux-en-Y Gastric Bypass (06/03/2017); and Hiatal hernia repair. Karen Lewis has a current medication list which includes the following prescription(s): acetaminophen, acyclovir, albuterol, amitriptyline, biotin, accu-chek aviva,  accu-chek aviva plus, butalbital-acetaminophen-caffeine, diphenhydramine, estradiol, famotidine, gabapentin, guaifenesin-codeine, ipratropium-albuterol, ketoconazole, ketorolac, simple diagnostics lancing dev, levocetirizine, levothyroxine, levothyroxine, magnesium oxide, meclizine, medroxyprogesterone, metformin, metoprolol tartrate, montelukast, multivitamin with minerals, naloxone, nitrofurantoin (macrocrystal-monohydrate), omeprazole, oxycodone, pioglitazone, quetiapine, ranitidine, rosuvastatin, sertraline, spironolactone, tizanidine, triamcinolone, triamcinolone, true metrix blood glucose test, ulticare alcohol swabs, zolpidem, zonisamide, levothyroxine, oxycodone, oxycodone, phentermine, and ranitidine, and the following Facility-Administered Medications: fentanyl and midazolam. Her primarily concern today is the Back Pain  Initial Vital Signs:  Pulse/HCG Rate: 89ECG Heart Rate: 81 Temp: 98.7 F (37.1 C) Resp: 17 BP: (!) 125/92 SpO2: 95 %  BMI: Estimated body mass index is 66.09 kg/m as calculated from the following:   Height as of this encounter: 5\' 4"  (1.626 m).   Weight as of this encounter: 385 lb (174.6 kg).  Risk Assessment: Allergies: Reviewed. She is allergic to bactrim [sulfamethoxazole-trimethoprim]; omalizumab; ciprofloxacin; shellfish allergy; aspirin; clindamycin; motrin [ibuprofen]; nsaids; other; and sulfa antibiotics.  Allergy Precautions: No iodine containing solutions or radiological contrast used. Coagulopathies: Reviewed. None identified.  Blood-thinner therapy: None at this time Active Infection(s): Reviewed. None identified. Karen Lewis is afebrile  Site Confirmation: Karen Lewis was asked to confirm the procedure and laterality before marking the site Procedure checklist: Completed Consent: Before the procedure and under the influence of no sedative(s), amnesic(s), or anxiolytics, the patient was informed of the treatment options, risks and possible complications. To  fulfill our ethical and legal obligations, as recommended by the American Medical Association's Code of Ethics, I have informed the patient of my clinical impression; the nature and purpose of the treatment or procedure; the risks, benefits, and possible complications of the intervention; the alternatives, including doing nothing; the risk(s) and benefit(s) of the alternative treatment(s) or procedure(s); and the risk(s) and benefit(s) of doing nothing. The patient was provided information  about the general risks and possible complications associated with the procedure. These may include, but are not limited to: failure to achieve desired goals, infection, bleeding, organ or nerve damage, allergic reactions, paralysis, and death. In addition, the patient was informed of those risks and complications associated to the procedure, such as failure to decrease pain; infection; bleeding; organ or nerve damage with subsequent damage to sensory, motor, and/or autonomic systems, resulting in permanent pain, numbness, and/or weakness of one or several areas of the body; allergic reactions; (i.e.: anaphylactic reaction); and/or death. Furthermore, the patient was informed of those risks and complications associated with the medications. These include, but are not limited to: allergic reactions (i.e.: anaphylactic or anaphylactoid reaction(s)); adrenal axis suppression; blood sugar elevation that in diabetics may result in ketoacidosis or comma; water retention that in patients with history of congestive heart failure may result in shortness of breath, pulmonary edema, and decompensation with resultant heart failure; weight gain; swelling or edema; medication-induced neural toxicity; particulate matter embolism and blood vessel occlusion with resultant organ, and/or nervous system infarction; and/or aseptic necrosis of one or more joints. Finally, the patient was informed that Medicine is not an exact science; therefore,  there is also the possibility of unforeseen or unpredictable risks and/or possible complications that may result in a catastrophic outcome. The patient indicated having understood very clearly. We have given the patient no guarantees and we have made no promises. Enough time was given to the patient to ask questions, all of which were answered to the patient's satisfaction. Karen Lewis has indicated that she wanted to continue with the procedure. Attestation: I, the ordering provider, attest that I have discussed with the patient the benefits, risks, side-effects, alternatives, likelihood of achieving goals, and potential problems during recovery for the procedure that I have provided informed consent. Date  Time: 04/27/2018  8:29 AM  Pre-Procedure Preparation:  Monitoring: As per clinic protocol. Respiration, ETCO2, SpO2, BP, heart rate and rhythm monitor placed and checked for adequate function Safety Precautions: Patient was assessed for positional comfort and pressure points before starting the procedure. Time-out: I initiated and conducted the "Time-out" before starting the procedure, as per protocol. The patient was asked to participate by confirming the accuracy of the "Time Out" information. Verification of the correct person, site, and procedure were performed and confirmed by me, the nursing staff, and the patient. "Time-out" conducted as per Joint Commission's Universal Protocol (UP.01.01.01). Time: 0905  Description of Procedure:          Position: Sitting Target Area: Knee Joint Approach: Just above the Lateral tibial plateau, lateral to the infrapatellar tendon. Area Prepped: Entire knee area, from the mid-thigh to the mid-shin. Prepping solution: ChloraPrep (2% chlorhexidine gluconate and 70% isopropyl alcohol) Safety Precautions: Aspiration looking for blood return was conducted prior to all injections. At no point did we inject any substances, as a needle was being advanced. No attempts  were made at seeking any paresthesias. Safe injection practices and needle disposal techniques used. Medications properly checked for expiration dates. SDV (single dose vial) medications used. Description of the Procedure: Protocol guidelines were followed. The patient was placed in position over the fluoroscopy table. The target area was identified and the area prepped in the usual manner. Skin & deeper tissues infiltrated with local anesthetic. Appropriate amount of time allowed to pass for local anesthetics to take effect. The procedure needles were then advanced to the target area. Proper needle placement secured. Negative aspiration confirmed. Solution injected in intermittent fashion,  asking for systemic symptoms every 0.5cc of injectate. The needles were then removed and the area cleansed, making sure to leave some of the prepping solution back to take advantage of its long term bactericidal properties. Vitals:   04/27/18 0915 04/27/18 0923 04/27/18 0933 04/27/18 0943  BP: (!) 112/52 101/83 113/60 110/68  Pulse:      Resp: 15 16 15  (!) 23  Temp:  (!) 97.3 F (36.3 C)  (!) 97.5 F (36.4 C)  TempSrc:  Temporal    SpO2: 92% 100% 99% 100%  Weight:      Height:        Start Time: 0905 hrs. End Time: 0914 hrs. Materials:  Needle(s) Type: Regular needle Gauge: 22G Length: 3.5-in Medication(s): Please see orders for medications and dosing details.  Imaging Guidance:          Type of Imaging Technique: Fluoroscopy Guidance (Non-spinal) Indication(s): Morbid obesity. Assistance in needle guidance and placement for procedures requiring needle placement in or near specific anatomical locations impossible to access without such assistance. Exposure Time: Please see nurses notes. Contrast: None used. Fluoroscopic Guidance: I was personally present during the use of fluoroscopy. "Tunnel Vision Technique" used to obtain the best possible view of the target area. Parallax error corrected before  commencing the procedure. "Direction-depth-direction" technique used to introduce the needle under continuous pulsed fluoroscopy. Once target was reached, antero-posterior, oblique, and lateral fluoroscopic projection used confirm needle placement in all planes. Images permanently stored in EMR. Ultrasound Guidance: N/A Interpretation: No contrast injected. I personally interpreted the imaging intraoperatively. Adequate needle placement confirmed in multiple planes. Permanent images saved into the patient's record.  Antibiotic Prophylaxis:   Anti-infectives (From admission, onward)   None     Indication(s): None identified  Post-operative Assessment:  Post-procedure Vital Signs:  Pulse/HCG Rate: 8981 Temp: (!) 97.5 F (36.4 C) Resp: (!) 23 BP: 110/68 SpO2: 100 %  EBL: None  Complications: No immediate post-treatment complications observed by team, or reported by patient.  Note: The patient tolerated the entire procedure well. A repeat set of vitals were taken after the procedure and the patient was kept under observation following institutional policy, for this type of procedure. Post-procedural neurological assessment was performed, showing return to baseline, prior to discharge. The patient was provided with post-procedure discharge instructions, including a section on how to identify potential problems. Should any problems arise concerning this procedure, the patient was given instructions to immediately contact us, at any time, without hesitation. In any case, we plan to contact the patient by telephone for a follow-up status report regarding this interventional procedure.  Comments:  No additional relevant information.  Plan of Care    Imaging Orders     DG C-Arm 1-60 Min-No Report     DG C-Arm 1-60 Min-No Report  Procedure Orders     KNEE INJECTION  Medications ordered for procedure: Meds ordered this encounter  Medications  . lidocaine (XYLOCAINE) 2 % (with pres)  injection 400 mg  . lidocaine (PF) (XYLOCAINE) 1 % injection 4 mL  . ropivacaine (PF) 2 mg/mL (0.2%) (NAROPIN) injection 4 mL  . Sodium Hyaluronate SOSY 2 mL  . Sodium Hyaluronate SOSY 2 mL  . midazolam (VERSED) 5 MG/5ML injection 1-2 mg    Make sure Flumazenil is available in the pyxis when using this medication. If oversedation occurs, administer 0.2 mg IV over 15 sec. If after 45 sec no response, administer 0.2 mg again over 1 min; may repeat at 1 min intervals;  not to exceed 4 doses (1 mg)  . fentaNYL (SUBLIMAZE) injection 25-50 mcg    Make sure Narcan is available in the pyxis when using this medication. In the event of respiratory depression (RR< 8/min): Titrate NARCAN (naloxone) in increments of 0.1 to 0.2 mg IV at 2-3 minute intervals, until desired degree of reversal.  . lactated ringers infusion 1,000 mL   Medications administered: We administered lidocaine, lidocaine (PF), ropivacaine (PF) 2 mg/mL (0.2%), Sodium Hyaluronate, Sodium Hyaluronate, midazolam, fentaNYL, and lactated ringers.  See the medical record for exact dosing, route, and time of administration.  New Prescriptions   No medications on file   Disposition: Discharge home  Discharge Date & Time: 04/27/2018; 0944 hrs.   Physician-requested Follow-up: Return for post-procedure eval (2 wks), w/ Dionisio David, NP.  Future Appointments  Date Time Provider [place]  05/04/2018 10:20 AM [facility]  05/11/2018  9:30 AM Vevelyn Francois, NP [facility] None  07/17/2018 10:30 AM Vevelyn Francois, NP [facility] None   Primary Care Physician: Idelle Crouch, MD Location: [facility] Note by: Gaspar Cola, MD Date: 04/27/2018; Time: 10:11 AM  Disclaimer:  Medicine is not an exact science. The only guarantee in medicine is that nothing is guaranteed. It is important to note that the decision to proceed with this intervention was based on the information collected from the  patient. The Data and conclusions were drawn from the patient's questionnaire, the interview, and the physical examination. Because the information was provided in large part by the patient, it cannot be guaranteed that it has not been purposely or unconsciously manipulated. Every effort has been made to obtain as much relevant data as possible for this evaluation. It is important to note that the conclusions that lead to this procedure are derived in large part from the available data. Always take into account that the treatment will also be dependent on availability of resources and existing treatment guidelines, considered by other Pain Management Practitioners as being common knowledge and practice, at the time of the intervention. For Medico-Legal purposes, it is also important to point out that variation in procedural techniques and pharmacological choices are the acceptable norm. The indications, contraindications, technique, and results of the above procedure should only be interpreted and judged by a Board-Certified Interventional Pain Specialist with extensive familiarity and expertise in the same exact procedure and technique.

## 2018-04-27 ENCOUNTER — Ambulatory Visit (HOSPITAL_BASED_OUTPATIENT_CLINIC_OR_DEPARTMENT_OTHER): Payer: Medicare HMO | Admitting: Pain Medicine

## 2018-04-27 ENCOUNTER — Ambulatory Visit
Admission: RE | Admit: 2018-04-27 | Discharge: 2018-04-27 | Disposition: A | Discharge status: Home / Self Care | Payer: Medicare HMO | Source: Ambulatory Visit | Attending: Pain Medicine | Admitting: Pain Medicine

## 2018-04-27 ENCOUNTER — Encounter: Payer: Self-pay | Admitting: Pain Medicine

## 2018-04-27 ENCOUNTER — Other Ambulatory Visit: Payer: Self-pay

## 2018-04-27 VITALS — BP 110/68 | HR 89 | Temp 97.5°F | Resp 23 | Ht 64.0 in | Wt 385.0 lb

## 2018-04-27 DIAGNOSIS — M25561 Pain in right knee: Secondary | ICD-10-CM

## 2018-04-27 DIAGNOSIS — Z888 Allergy status to other drugs, medicaments and biological substances status: Secondary | ICD-10-CM | POA: Diagnosis not present

## 2018-04-27 DIAGNOSIS — Z91013 Allergy to seafood: Secondary | ICD-10-CM | POA: Diagnosis not present

## 2018-04-27 DIAGNOSIS — Z6841 Body Mass Index (BMI) 40.0 and over, adult: Secondary | ICD-10-CM

## 2018-04-27 DIAGNOSIS — M545 Low back pain: Secondary | ICD-10-CM | POA: Diagnosis not present

## 2018-04-27 DIAGNOSIS — R102 Pelvic and perineal pain: Secondary | ICD-10-CM | POA: Diagnosis not present

## 2018-04-27 DIAGNOSIS — Z79891 Long term (current) use of opiate analgesic: Secondary | ICD-10-CM | POA: Insufficient documentation

## 2018-04-27 DIAGNOSIS — E1159 Type 2 diabetes mellitus with other circulatory complications: Secondary | ICD-10-CM | POA: Diagnosis not present

## 2018-04-27 DIAGNOSIS — E785 Hyperlipidemia, unspecified: Secondary | ICD-10-CM | POA: Diagnosis not present

## 2018-04-27 DIAGNOSIS — Z9049 Acquired absence of other specified parts of digestive tract: Secondary | ICD-10-CM | POA: Diagnosis not present

## 2018-04-27 DIAGNOSIS — M174 Other bilateral secondary osteoarthritis of knee: Secondary | ICD-10-CM

## 2018-04-27 DIAGNOSIS — Z7984 Long term (current) use of oral hypoglycemic drugs: Secondary | ICD-10-CM | POA: Insufficient documentation

## 2018-04-27 DIAGNOSIS — Z886 Allergy status to analgesic agent status: Secondary | ICD-10-CM | POA: Insufficient documentation

## 2018-04-27 DIAGNOSIS — G8929 Other chronic pain: Secondary | ICD-10-CM | POA: Insufficient documentation

## 2018-04-27 DIAGNOSIS — F419 Anxiety disorder, unspecified: Secondary | ICD-10-CM | POA: Diagnosis not present

## 2018-04-27 DIAGNOSIS — Z881 Allergy status to other antibiotic agents status: Secondary | ICD-10-CM | POA: Insufficient documentation

## 2018-04-27 DIAGNOSIS — E890 Postprocedural hypothyroidism: Secondary | ICD-10-CM | POA: Insufficient documentation

## 2018-04-27 DIAGNOSIS — Z8781 Personal history of (healed) traumatic fracture: Secondary | ICD-10-CM | POA: Diagnosis not present

## 2018-04-27 DIAGNOSIS — Z9884 Bariatric surgery status: Secondary | ICD-10-CM | POA: Diagnosis not present

## 2018-04-27 DIAGNOSIS — M549 Dorsalgia, unspecified: Secondary | ICD-10-CM | POA: Diagnosis present

## 2018-04-27 DIAGNOSIS — Z882 Allergy status to sulfonamides status: Secondary | ICD-10-CM | POA: Insufficient documentation

## 2018-04-27 DIAGNOSIS — M25562 Pain in left knee: Secondary | ICD-10-CM | POA: Diagnosis not present

## 2018-04-27 DIAGNOSIS — M170 Bilateral primary osteoarthritis of knee: Secondary | ICD-10-CM | POA: Diagnosis not present

## 2018-04-27 DIAGNOSIS — G4733 Obstructive sleep apnea (adult) (pediatric): Secondary | ICD-10-CM | POA: Diagnosis not present

## 2018-04-27 DIAGNOSIS — I10 Essential (primary) hypertension: Secondary | ICD-10-CM | POA: Diagnosis not present

## 2018-04-27 DIAGNOSIS — Z79890 Hormone replacement therapy (postmenopausal): Secondary | ICD-10-CM | POA: Insufficient documentation

## 2018-04-27 DIAGNOSIS — Z79899 Other long term (current) drug therapy: Secondary | ICD-10-CM | POA: Insufficient documentation

## 2018-04-27 DIAGNOSIS — Z96642 Presence of left artificial hip joint: Secondary | ICD-10-CM | POA: Diagnosis not present

## 2018-04-27 DIAGNOSIS — E1169 Type 2 diabetes mellitus with other specified complication: Secondary | ICD-10-CM | POA: Diagnosis not present

## 2018-04-27 DIAGNOSIS — E119 Type 2 diabetes mellitus without complications: Secondary | ICD-10-CM | POA: Diagnosis not present

## 2018-04-27 MED ORDER — SODIUM HYALURONATE (VISCOSUP) 20 MG/2ML IX SOSY
2.0000 mL | PREFILLED_SYRINGE | Freq: Once | INTRA_ARTICULAR | Status: AC
  Administered 2018-04-27: 2 mL via INTRA_ARTICULAR

## 2018-04-27 MED ORDER — FENTANYL CITRATE (PF) 100 MCG/2ML IJ SOLN
25.0000 ug | INTRAMUSCULAR | Status: DC | PRN
  Administered 2018-04-27: 100 ug via INTRAVENOUS
  Filled 2018-04-27: qty 2

## 2018-04-27 MED ORDER — LACTATED RINGERS IV SOLN
1000.0000 mL | Freq: Once | INTRAVENOUS | Status: AC
  Administered 2018-04-27: 1000 mL via INTRAVENOUS

## 2018-04-27 MED ORDER — LIDOCAINE HCL 2 % IJ SOLN
20.0000 mL | Freq: Once | INTRAMUSCULAR | Status: AC
  Administered 2018-04-27: 400 mg
  Filled 2018-04-27: qty 40

## 2018-04-27 MED ORDER — ROPIVACAINE HCL 2 MG/ML IJ SOLN
4.0000 mL | Freq: Once | INTRAMUSCULAR | Status: AC
  Administered 2018-04-27: 4 mL via INTRA_ARTICULAR
  Filled 2018-04-27: qty 10

## 2018-04-27 MED ORDER — LIDOCAINE HCL (PF) 1 % IJ SOLN
4.0000 mL | Freq: Once | INTRAMUSCULAR | Status: AC
  Administered 2018-04-27: 4 mL
  Filled 2018-04-27: qty 5

## 2018-04-27 MED ORDER — MIDAZOLAM HCL 5 MG/5ML IJ SOLN
1.0000 mg | INTRAMUSCULAR | Status: DC | PRN
  Administered 2018-04-27: 3 mg via INTRAVENOUS
  Filled 2018-04-27: qty 5

## 2018-04-27 NOTE — Patient Instructions (Signed)

## 2018-04-28 ENCOUNTER — Telehealth: Payer: Self-pay

## 2018-04-28 ENCOUNTER — Other Ambulatory Visit: Payer: Self-pay | Admitting: Internal Medicine

## 2018-04-28 DIAGNOSIS — G8929 Other chronic pain: Secondary | ICD-10-CM

## 2018-04-28 DIAGNOSIS — M545 Low back pain: Principal | ICD-10-CM

## 2018-04-28 NOTE — Telephone Encounter (Signed)
POst procedure phone call.  Patient states she is doing ok.

## 2018-05-01 ENCOUNTER — Encounter: Payer: Medicare HMO | Admitting: Nurse Practitioner

## 2018-05-02 ENCOUNTER — Ambulatory Visit
Admission: RE | Admit: 2018-05-02 | Discharge: 2018-05-02 | Disposition: A | Discharge status: Home / Self Care | Payer: Medicare HMO | Source: Ambulatory Visit | Attending: Internal Medicine | Admitting: Internal Medicine

## 2018-05-02 DIAGNOSIS — N390 Urinary tract infection, site not specified: Secondary | ICD-10-CM | POA: Insufficient documentation

## 2018-05-02 DIAGNOSIS — G8929 Other chronic pain: Secondary | ICD-10-CM | POA: Diagnosis not present

## 2018-05-02 DIAGNOSIS — R3129 Other microscopic hematuria: Secondary | ICD-10-CM | POA: Diagnosis not present

## 2018-05-02 DIAGNOSIS — Z780 Asymptomatic menopausal state: Secondary | ICD-10-CM | POA: Diagnosis not present

## 2018-05-02 DIAGNOSIS — M545 Low back pain: Secondary | ICD-10-CM | POA: Insufficient documentation

## 2018-05-04 ENCOUNTER — Ambulatory Visit
Admission: RE | Admit: 2018-05-04 | Discharge: 2018-05-04 | Disposition: A | Discharge status: Home / Self Care | Payer: Medicare HMO | Source: Ambulatory Visit | Attending: Internal Medicine | Admitting: Internal Medicine

## 2018-05-04 DIAGNOSIS — Z1231 Encounter for screening mammogram for malignant neoplasm of breast: Secondary | ICD-10-CM | POA: Insufficient documentation

## 2018-05-04 DIAGNOSIS — R102 Pelvic and perineal pain: Secondary | ICD-10-CM | POA: Diagnosis not present

## 2018-05-04 DIAGNOSIS — R5383 Other fatigue: Secondary | ICD-10-CM | POA: Diagnosis not present

## 2018-05-04 DIAGNOSIS — R5381 Other malaise: Secondary | ICD-10-CM | POA: Diagnosis not present

## 2018-05-04 DIAGNOSIS — R109 Unspecified abdominal pain: Secondary | ICD-10-CM | POA: Diagnosis not present

## 2018-05-06 DIAGNOSIS — Z0100 Encounter for examination of eyes and vision without abnormal findings: Secondary | ICD-10-CM | POA: Diagnosis not present

## 2018-05-11 ENCOUNTER — Ambulatory Visit: Payer: Medicare HMO | Admitting: Nurse Practitioner

## 2018-05-17 ENCOUNTER — Encounter: Payer: Self-pay | Admitting: Nurse Practitioner

## 2018-05-17 ENCOUNTER — Ambulatory Visit: Payer: Medicare HMO | Attending: Nurse Practitioner | Admitting: Nurse Practitioner

## 2018-05-17 VITALS — BP 116/65 | HR 82 | Temp 98.4°F | Resp 16 | Ht 64.0 in | Wt 389.0 lb

## 2018-05-17 DIAGNOSIS — G894 Chronic pain syndrome: Secondary | ICD-10-CM | POA: Insufficient documentation

## 2018-05-17 DIAGNOSIS — E282 Polycystic ovarian syndrome: Secondary | ICD-10-CM | POA: Insufficient documentation

## 2018-05-17 DIAGNOSIS — M1611 Unilateral primary osteoarthritis, right hip: Secondary | ICD-10-CM | POA: Diagnosis not present

## 2018-05-17 DIAGNOSIS — Z886 Allergy status to analgesic agent status: Secondary | ICD-10-CM | POA: Insufficient documentation

## 2018-05-17 DIAGNOSIS — M19012 Primary osteoarthritis, left shoulder: Secondary | ICD-10-CM | POA: Diagnosis not present

## 2018-05-17 DIAGNOSIS — G4733 Obstructive sleep apnea (adult) (pediatric): Secondary | ICD-10-CM | POA: Insufficient documentation

## 2018-05-17 DIAGNOSIS — Z8249 Family history of ischemic heart disease and other diseases of the circulatory system: Secondary | ICD-10-CM | POA: Insufficient documentation

## 2018-05-17 DIAGNOSIS — M47816 Spondylosis without myelopathy or radiculopathy, lumbar region: Secondary | ICD-10-CM | POA: Diagnosis not present

## 2018-05-17 DIAGNOSIS — E559 Vitamin D deficiency, unspecified: Secondary | ICD-10-CM | POA: Insufficient documentation

## 2018-05-17 DIAGNOSIS — J45909 Unspecified asthma, uncomplicated: Secondary | ICD-10-CM | POA: Diagnosis not present

## 2018-05-17 DIAGNOSIS — Z6841 Body Mass Index (BMI) 40.0 and over, adult: Secondary | ICD-10-CM | POA: Diagnosis not present

## 2018-05-17 DIAGNOSIS — Z7984 Long term (current) use of oral hypoglycemic drugs: Secondary | ICD-10-CM | POA: Insufficient documentation

## 2018-05-17 DIAGNOSIS — J4520 Mild intermittent asthma, uncomplicated: Secondary | ICD-10-CM | POA: Diagnosis not present

## 2018-05-17 DIAGNOSIS — E119 Type 2 diabetes mellitus without complications: Secondary | ICD-10-CM | POA: Diagnosis not present

## 2018-05-17 DIAGNOSIS — E785 Hyperlipidemia, unspecified: Secondary | ICD-10-CM | POA: Insufficient documentation

## 2018-05-17 DIAGNOSIS — Z818 Family history of other mental and behavioral disorders: Secondary | ICD-10-CM | POA: Insufficient documentation

## 2018-05-17 DIAGNOSIS — D649 Anemia, unspecified: Secondary | ICD-10-CM | POA: Insufficient documentation

## 2018-05-17 DIAGNOSIS — Z9049 Acquired absence of other specified parts of digestive tract: Secondary | ICD-10-CM | POA: Insufficient documentation

## 2018-05-17 DIAGNOSIS — E039 Hypothyroidism, unspecified: Secondary | ICD-10-CM | POA: Insufficient documentation

## 2018-05-17 DIAGNOSIS — M25561 Pain in right knee: Secondary | ICD-10-CM | POA: Diagnosis not present

## 2018-05-17 DIAGNOSIS — M25562 Pain in left knee: Secondary | ICD-10-CM | POA: Diagnosis not present

## 2018-05-17 DIAGNOSIS — Z833 Family history of diabetes mellitus: Secondary | ICD-10-CM | POA: Insufficient documentation

## 2018-05-17 DIAGNOSIS — Z9884 Bariatric surgery status: Secondary | ICD-10-CM | POA: Diagnosis not present

## 2018-05-17 DIAGNOSIS — Z79890 Hormone replacement therapy (postmenopausal): Secondary | ICD-10-CM | POA: Insufficient documentation

## 2018-05-17 DIAGNOSIS — M797 Fibromyalgia: Secondary | ICD-10-CM | POA: Insufficient documentation

## 2018-05-17 DIAGNOSIS — M545 Low back pain: Secondary | ICD-10-CM | POA: Insufficient documentation

## 2018-05-17 DIAGNOSIS — Z5181 Encounter for therapeutic drug level monitoring: Secondary | ICD-10-CM | POA: Diagnosis not present

## 2018-05-17 DIAGNOSIS — K219 Gastro-esophageal reflux disease without esophagitis: Secondary | ICD-10-CM | POA: Diagnosis not present

## 2018-05-17 DIAGNOSIS — Z881 Allergy status to other antibiotic agents status: Secondary | ICD-10-CM | POA: Insufficient documentation

## 2018-05-17 DIAGNOSIS — M546 Pain in thoracic spine: Secondary | ICD-10-CM | POA: Insufficient documentation

## 2018-05-17 DIAGNOSIS — G629 Polyneuropathy, unspecified: Secondary | ICD-10-CM | POA: Insufficient documentation

## 2018-05-17 DIAGNOSIS — Z79899 Other long term (current) drug therapy: Secondary | ICD-10-CM | POA: Insufficient documentation

## 2018-05-17 DIAGNOSIS — E042 Nontoxic multinodular goiter: Secondary | ICD-10-CM | POA: Insufficient documentation

## 2018-05-17 DIAGNOSIS — M170 Bilateral primary osteoarthritis of knee: Secondary | ICD-10-CM | POA: Insufficient documentation

## 2018-05-17 DIAGNOSIS — M109 Gout, unspecified: Secondary | ICD-10-CM | POA: Insufficient documentation

## 2018-05-17 DIAGNOSIS — R5383 Other fatigue: Secondary | ICD-10-CM | POA: Diagnosis not present

## 2018-05-17 DIAGNOSIS — Z96642 Presence of left artificial hip joint: Secondary | ICD-10-CM | POA: Insufficient documentation

## 2018-05-17 DIAGNOSIS — F5081 Binge eating disorder: Secondary | ICD-10-CM | POA: Insufficient documentation

## 2018-05-17 DIAGNOSIS — F331 Major depressive disorder, recurrent, moderate: Secondary | ICD-10-CM | POA: Insufficient documentation

## 2018-05-17 DIAGNOSIS — G8929 Other chronic pain: Secondary | ICD-10-CM

## 2018-05-17 DIAGNOSIS — F411 Generalized anxiety disorder: Secondary | ICD-10-CM | POA: Insufficient documentation

## 2018-05-17 DIAGNOSIS — Z8614 Personal history of Methicillin resistant Staphylococcus aureus infection: Secondary | ICD-10-CM | POA: Insufficient documentation

## 2018-05-17 DIAGNOSIS — I10 Essential (primary) hypertension: Secondary | ICD-10-CM | POA: Insufficient documentation

## 2018-05-17 DIAGNOSIS — Z79891 Long term (current) use of opiate analgesic: Secondary | ICD-10-CM | POA: Diagnosis not present

## 2018-05-17 DIAGNOSIS — E049 Nontoxic goiter, unspecified: Secondary | ICD-10-CM | POA: Diagnosis not present

## 2018-05-17 DIAGNOSIS — R0609 Other forms of dyspnea: Secondary | ICD-10-CM | POA: Diagnosis not present

## 2018-05-17 DIAGNOSIS — M47897 Other spondylosis, lumbosacral region: Secondary | ICD-10-CM | POA: Diagnosis not present

## 2018-05-17 DIAGNOSIS — Z882 Allergy status to sulfonamides status: Secondary | ICD-10-CM | POA: Insufficient documentation

## 2018-05-17 NOTE — Progress Notes (Signed)
Patient's Name: Karen Lewis  MRN: [id number]  Referring Provider: Idelle Crouch, MD  DOB: 1971/02/07  PCP: Karen Crouch, MD  DOS: 05/17/2018  Note by: Vevelyn Francois NP  Service setting: Ambulatory outpatient  Specialty: Interventional Pain Management  Location: [facility] (AMB) Pain Management Facility    Patient type: Established    Primary Reason(s) for Visit: Encounter for prescription drug management & post-procedure evaluation of chronic illness with mild to moderate exacerbation(Level of risk: moderate) CC: Knee Pain (bilateral); Back Pain (mid and lower); and Shoulder Pain (left)  HPI  Karen Lewis is a 47 y.o. year old, female patient, who comes today for a post-procedure evaluation and medication management. She has History of asthma; Binge eating disorder; Fibromyalgia; Anxiety, generalized; Insomnia, persistent; Depression, major, recurrent, moderate ([place]); Uncomplicated asthma; Eczema; Gout; Hyperlipidemia; Headache, migraine; Obstructive apnea; Bilateral polycystic ovarian syndrome; Apnea, sleep; Disease of thyroid gland; Avitaminosis D; History of surgical procedure; Type 2 diabetes mellitus without complication, without long-term current use of insulin ([place]); Long term current use of opiate analgesic; Long term prescription opiate use; Opiate use; Encounter for therapeutic drug level monitoring; Opiate dependence ([place]); Hypertension associated with diabetes ([place]); Goiter, nontoxic, multinodular; Chronic knee pain (Primary Source of Pain) (Bilateral) (R>L); Chronic low back pain [facility] source of pain) (Bilateral) (R>L); Lumbar facet syndrome (Bilateral) (R>L); Osteoarthritis; Grade 1 (1.4 cm) Anterolisthesis of L4 over L5; Chronic hip pain (Secondary source of pain) (Right); S/P THR (total hip replacement) (Left); History of methicillin resistant staphylococcus aureus (MRSA); History of bariatric surgery; Hypomagnesemia; Morbid obesity with body mass index (BMI) greater than or equal to 70  in adult [facility]); History of methicillin resistant Staphylococcus aureus infection; Lumbar spondylosis; Vitamin D insufficiency; Dysphagia, unspecified; Gastroesophageal reflux disease without esophagitis; Chest pain with low risk of acute coronary syndrome; Nontoxic goiter; Other psychoactive substance dependence, uncomplicated (HCC); SOB (shortness of breath) on exertion; Chronic pain syndrome; Neurogenic pain; Upper extremity pain; Chronic shoulder pain (Left); Osteoarthritis of shoulder (Left); Controlled drug dependence ([place]); Osteoarthritis of hip (Right); Hyperlipidemia due to type 2 diabetes mellitus ([place]); Post-operative hypothyroidism; Secondary Osteoarthritis of knee (Bilateral) (R>L); Major depressive disorder, single episode; Depression; Dermatitis; Asthma; Encounter for dental examination; Encounter for dental exam and cleaning w/o abnormal findings; Spondylosis without myelopathy or radiculopathy, lumbosacral region; History of gastric bypass; Allergy to iodine; and H/O allergy to shellfish on their problem list. Her primarily concern today is the Knee Pain (bilateral); Back Pain (mid and lower); and Shoulder Pain (left)  Pain Assessment: Location: Right, Left Knee(see visit information for other pain sites ) Radiating: back pain into hips and legs, shoulder pain into arm and neck. right knee radiates around the knee  Onset: More than a month ago Duration: Chronic pain Quality: Discomfort, Constant, Aching, Sharp, Burning, Numbness(right knee is burning.  numbness in left arm and left leg at the top of thigh) Severity: 4 /10 (subjective, self-reported pain score)  Note: Reported level is compatible with observation.                          Effect on ADL: pain limits activities.  Timing: Constant Modifying factors: pain medicine helps, repositioning, heat, maxfreeze, massage BP: 116/65  HR: 82  Karen Lewis was last seen on 04/28/2018 for a procedure. During today's appointment we reviewed  Karen Lewis's post-procedure results, as well as her outpatient medication regimen.  Further details on both, my assessment(s), as well as the proposed treatment plan, please see  below.   Post-Procedure Assessment  05/04/2018 Procedure: Bilateral Hyalgan  Pre-procedure pain score:  5/10 Post-procedure pain score: 0/10         Influential Factors: BMI: 66.77 kg/m Intra-procedural challenges: None observed.         Assessment challenges: None detected.              Reported side-effects: None.        Post-procedural adverse reactions or complications: None reported         Sedation: Please see nurses note. When no sedatives are used, the analgesic levels obtained are directly associated to the effectiveness of the local anesthetics. However, when sedation is provided, the level of analgesia obtained during the initial 1 hour following the intervention, is believed to be the result of a combination of factors. These factors may include, but are not limited to: 1. The effectiveness of the local anesthetics used. 2. The effects of the analgesic(s) and/or anxiolytic(s) used. 3. The degree of discomfort experienced by the patient at the time of the procedure. 4. The patients ability and reliability in recalling and recording the events. 5. The presence and influence of possible secondary gains and/or psychosocial factors. Reported result: Relief experienced during the 1st hour after the procedure: 100 % (Ultra-Short Term Relief)            Interpretative annotation: Clinically appropriate result. Analgesia during this period is likely to be Local Anesthetic and/or IV Sedative (Analgesic/Anxiolytic) related.          Effects of local anesthetic: The analgesic effects attained during this period are directly associated to the localized infiltration of local anesthetics and therefore cary significant diagnostic value as to the etiological location, or anatomical origin, of the pain. Expected duration of  relief is directly dependent on the pharmacodynamics of the local anesthetic used. Long-acting (4-6 hours) anesthetics used.  Reported result: Relief during the next 4 to 6 hour after the procedure: 100 % (Short-Term Relief)            Interpretative annotation: Clinically appropriate result. Analgesia during this period is likely to be Local Anesthetic-related.          Long-term benefit: Defined as the period of time past the expected duration of local anesthetics (1 hour for short-acting and 4-6 hours for long-acting). With the possible exception of prolonged sympathetic blockade from the local anesthetics, benefits during this period are typically attributed to, or associated with, other factors such as analgesic sensory neuropraxia, antiinflammatory effects, or beneficial biochemical changes provided by agents other than the local anesthetics.  Reported result: Extended relief following procedure: 100 %(good relief x 2 weeks and then the burning returned. ) (Long-Term Relief)            Interpretative annotation: Clinically possible results. Good relief. No permanent benefit expected. Inflammation plays a part in the etiology to the pain.          Current benefits: Defined as reported results that persistent at this point in time.   Analgesia: >50 %            right continues to burn left injection is great function: Somewhat improved ROM: Somewhat improved Interpretative annotation: Recurrence of symptoms. on the right. She admits that the left knee is spot on.     Effective diagnostic intervention.          Interpretation: Results would suggest a successful diagnostic intervention.  Plan:  Please see "Plan of Care" for details.                Laboratory Chemistry  Inflammation Markers (CRP: Acute Phase) (ESR: Chronic Phase) Lab Results  Component Value Date   CRP <0.5 11/24/2015   ESRSEDRATE 20 01/30/2018                         Rheumatology Markers No results found  for: RF, ANA, LABURIC, URICUR, LYMEIGGIGMAB, LYMEABIGMQN, HLAB27                      Renal Function Markers Lab Results  Component Value Date   BUN 8 11/24/2015   CREATININE 0.80 05/11/2016   GFRAA >60 11/24/2015   GFRNONAA >60 11/24/2015                             Hepatic Function Markers Lab Results  Component Value Date   AST 23 11/24/2015   ALT 20 11/24/2015   ALBUMIN 3.7 11/24/2015   ALKPHOS 81 11/24/2015                        Electrolytes Lab Results  Component Value Date   NA 139 11/24/2015   K 4.3 11/24/2015   CL 105 11/24/2015   CALCIUM 8.6 (L) 11/24/2015   MG 1.6 01/30/2018                        Neuropathy Markers Lab Results  Component Value Date   VITAMINB12 303 11/24/2015                        Bone Pathology Markers Lab Results  Component Value Date   VD25OH 27.4 (L) 11/24/2015   VD125OH2TOT 41.5 11/24/2015                         Coagulation Parameters Lab Results  Component Value Date   INR 0.95 05/07/2015   LABPROT 12.9 05/07/2015   APTT 26 05/07/2015   PLT 337 11/12/2015                        Cardiovascular Markers Lab Results  Component Value Date   HGB 12.4 11/12/2015   HCT 38.0 11/12/2015                         CA Markers No results found for: CEA, CA125, LABCA2                      Note: Lab results reviewed.  Recent Diagnostic Imaging Results  MM 3D SCREEN BREAST BILATERAL CLINICAL DATA:  Screening.  EXAM: DIGITAL SCREENING BILATERAL MAMMOGRAM WITH TOMO AND CAD  COMPARISON:  Previous exam(s).  ACR Breast Density Category b: There are scattered areas of fibroglandular density.  FINDINGS: There are no findings suspicious for malignancy. Images were processed with CAD.  IMPRESSION: No mammographic evidence of malignancy. A result letter of this screening mammogram will be mailed directly to the patient.  RECOMMENDATION: Screening mammogram in one year. (Code:[id number])  BI-RADS CATEGORY  1:  Negative.  Electronically Signed   By: Fidela Salisbury M.D.   On: 05/04/2018 10:09  Complexity Note: Imaging results reviewed. Results shared with Ms.  Brigitte Pulse, using Layman's terms.                         Meds   Current Outpatient Medications:  .  Acetaminophen 500 MG coapsule, Take 500 mg by mouth every 6 (six) hours as needed. , Disp: , Rfl:  .  acyclovir (ZOVIRAX) 800 MG tablet, Take 800 mg by mouth 3 times/day as needed-between meals & bedtime. Reported on 11/12/2015, Disp: , Rfl:  .  albuterol (PROVENTIL HFA;VENTOLIN HFA) 108 (90 BASE) MCG/ACT inhaler, Inhale 2 puffs into the lungs every 6 (six) hours as needed for wheezing or shortness of breath., Disp: , Rfl:  .  amitriptyline (ELAVIL) 100 MG tablet, , Disp: , Rfl: 3 .  Biotin 10 MG CAPS, , Disp: , Rfl:  .  Blood Glucose Calibration (ACCU-CHEK AVIVA) SOLN, , Disp: , Rfl:  .  Blood Glucose Monitoring Suppl (ACCU-CHEK AVIVA PLUS) w/Device KIT, , Disp: , Rfl:  .  butalbital-acetaminophen-caffeine (FIORICET, ESGIC) 50-325-40 MG tablet, 1 tablet every 6 (six) hours as needed. , Disp: , Rfl:  .  diphenhydrAMINE (BENADRYL) 25 mg capsule, Take by mouth., Disp: , Rfl:  .  estradiol (ESTRACE) 0.1 MG/GM vaginal cream, , Disp: , Rfl:  .  famotidine (PEPCID) 20 MG tablet, Take by mouth., Disp: , Rfl:  .  gabapentin (NEURONTIN) 800 MG tablet, Take 1 tablet (800 mg total) by mouth every 6 (six) hours., Disp: 360 tablet, Rfl: 0 .  guaiFENesin-codeine 100-10 MG/5ML syrup, Take by mouth., Disp: , Rfl:  .  ipratropium-albuterol (DUONEB) 0.5-2.5 (3) MG/3ML SOLN, 3 mLs as needed. , Disp: , Rfl:  .  ketoconazole (NIZORAL) 2 % cream, APPLY AS DIRECTED TO AFFECTED AREA TWICE DAILY AS NEEDED, Disp: , Rfl:  .  ketorolac (TORADOL) 10 MG tablet, Take 1 tablet (10 mg total) by mouth every 6 (six) hours as needed., Disp: 20 tablet, Rfl: 0 .  Lancet Devices (SIMPLE DIAGNOSTICS LANCING DEV) MISC, Use 1 each as directed. Accu Chek Softclix. DX: E11.8, Disp: , Rfl:   .  levocetirizine (XYZAL) 5 MG tablet, Take 5 mg by mouth every evening. , Disp: , Rfl:  .  levothyroxine (SYNTHROID, LEVOTHROID) 125 MCG tablet, Take 250 mcg by mouth daily before breakfast., Disp: , Rfl:  .  levothyroxine (SYNTHROID, LEVOTHROID) 200 MCG tablet, Take on an empty stomach each morning before breakfast. Total dose 250 mcg daily., Disp: , Rfl:  .  magnesium oxide (MAG-OX) 400 MG tablet, Take 1 tablet (400 mg total) by mouth 2 (two) times daily., Disp: 60 tablet, Rfl: 2 .  meclizine (ANTIVERT) 25 MG tablet, Take 1 tablet (25 mg total) by mouth 3 (three) times daily as needed for dizziness., Disp: 30 tablet, Rfl: 0 .  medroxyPROGESTERone (DEPO-PROVERA) 150 MG/ML injection, Inject 1 mL (150 mg total) into the muscle every 3 (three) months., Disp: 1 mL, Rfl: 3 .  metFORMIN (GLUCOPHAGE) 1000 MG tablet, Take 1,000 mg by mouth 2 (two) times daily with a meal. , Disp: , Rfl:  .  metoprolol (LOPRESSOR) 50 MG tablet, Take 75 mg by mouth 2 (two) times daily. , Disp: , Rfl:  .  montelukast (SINGULAIR) 10 MG tablet, Take 10 mg by mouth at bedtime. , Disp: , Rfl:  .  Multiple Vitamin (MULTIVITAMIN WITH MINERALS) TABS tablet, Take 2 tablets by mouth daily. Reported on 11/12/2015, Disp: , Rfl:  .  naloxone (NARCAN) nasal spray 4 mg/0.1 mL, Spray into one nostril. Repeat with second  device into other nostril after 2-3 minutes if no or minimal response., Disp: 1 kit, Rfl: 0 .  nitrofurantoin, macrocrystal-monohydrate, (MACROBID) 100 MG capsule, , Disp: , Rfl:  .  omeprazole (PRILOSEC) 40 MG capsule, Take 40 mg by mouth 2 (two) times daily. , Disp: , Rfl:  .  oxyCODONE (OXY IR/ROXICODONE) 5 MG immediate release tablet, Take 1 tablet (5 mg total) by mouth 5 (five) times daily as needed for severe pain., Disp: 150 tablet, Rfl: 0 .  pioglitazone (ACTOS) 15 MG tablet, , Disp: , Rfl:  .  QUEtiapine (SEROQUEL) 100 MG tablet, Take 1 tablet (100 mg total) by mouth at bedtime., Disp: 90 tablet, Rfl: 0 .   ranitidine (ZANTAC) 150 MG capsule, Take by mouth., Disp: , Rfl:  .  rosuvastatin (CRESTOR) 40 MG tablet, Take 40 mg by mouth daily. , Disp: , Rfl:  .  sertraline (ZOLOFT) 100 MG tablet, Take 2 tablets (200 mg total) by mouth daily., Disp: 180 tablet, Rfl: 2 .  spironolactone (ALDACTONE) 25 MG tablet, Take 37.5 mg by mouth 2 (two) times daily. , Disp: , Rfl:  .  tiZANidine (ZANAFLEX) 4 MG tablet, 4 mg at bedtime. , Disp: , Rfl:  .  triamcinolone (KENALOG) 0.1 % paste, Apply 1 application topically 2 (two) times daily. , Disp: , Rfl:  .  triamcinolone (NASACORT AQ) 55 MCG/ACT AERO nasal inhaler, 2 sprays by Nasal route 2 (two) times daily., Disp: , Rfl:  .  TRUE METRIX BLOOD GLUCOSE TEST test strip, , Disp: , Rfl:  .  ULTICARE ALCOHOL SWABS PADS, Apply 1 each topically 2 (two) times daily. Use when checking BG. DX: E11.8, Disp: , Rfl:  .  zolpidem (AMBIEN) 10 MG tablet, Take 10 mg by mouth at bedtime as needed for sleep., Disp: , Rfl:  .  zonisamide (ZONEGRAN) 50 MG capsule, Take 150 mg by mouth daily. 3 tabs at bedtime, Disp: , Rfl:  .  levothyroxine (SYNTHROID, LEVOTHROID) 50 MCG tablet, Take 50 mcg by mouth daily before breakfast. , Disp: , Rfl:  .  oxyCODONE (OXY IR/ROXICODONE) 5 MG immediate release tablet, Take 1 tablet (5 mg total) by mouth 5 (five) times daily as needed for severe pain., Disp: 150 tablet, Rfl: 0 .  oxyCODONE (OXY IR/ROXICODONE) 5 MG immediate release tablet, Take 1 tablet (5 mg total) by mouth 5 (five) times daily as needed for severe pain., Disp: 150 tablet, Rfl: 0 .  phentermine 15 MG capsule, Take 37.5 mg by mouth. , Disp: , Rfl:  .  ranitidine (ZANTAC) 150 MG capsule, Take 150 mg by mouth every evening. , Disp: , Rfl:   ROS  Constitutional: Denies any fever or chills Gastrointestinal: No reported hemesis, hematochezia, vomiting, or acute GI distress Musculoskeletal: Denies any acute onset joint swelling, redness, loss of ROM, or weakness Neurological: No reported  episodes of acute onset apraxia, aphasia, dysarthria, agnosia, amnesia, paralysis, loss of coordination, or loss of consciousness  Allergies  Ms. Mun is allergic to bactrim [sulfamethoxazole-trimethoprim]; omalizumab; ciprofloxacin; shellfish allergy; aspirin; clindamycin; motrin [ibuprofen]; nsaids; other; and sulfa antibiotics.  [place]  Drug: Ms. Zahniser  reports that she does not use drugs. Alcohol:  reports that she does not drink alcohol. Tobacco:  reports that she has never smoked. She has never used smokeless tobacco. Medical:  has a past medical history of Anemia, Anginal pain ([place]), Anxiety, Arthralgia of hip (07/29/2015), Arthritis, Arthritis, degenerative (07/29/2015), Asthma, Cephalalgia (07/25/2014), Dependence on unknown drug ([place]), Depression, Diabetes mellitus without complication ([place]),  Dysrhythmia, Eczema, Fibromyalgia, Gastritis, GERD (gastroesophageal reflux disease), Gonalgia (07/29/2015), Gout, H/O cardiovascular disorder (03/10/2015), H/O surgical procedure (12/05/2012), H/O thyroid disease (03/10/2015), Headache, Herpes, History of artificial joint (07/29/2015), History of hiatal hernia, Hypertension, Hypomagnesemia, Hypothyroidism, LBP (low back pain) (07/29/2015), Neuromuscular disorder ([place]), Obesity, PCOS (polycystic ovarian syndrome), Primary osteoarthritis of both knees (07/29/2015), Sleep apnea, and Thyroid nodule (bilateral). Surgical: Ms. Sparger  has a past surgical history that includes Laparoscopic partial gastrectomy; Shoulder arthroscopy (Right); Joint replacement (Left, hip); Carpal tunnel release (Bilateral); Diagnostic laparoscopy; Cholecystectomy; Trigger finger release (Right); Thyroidectomy (N/A, 11/12/2015); left trigger finger; Roux-en-Y Gastric Bypass (06/03/2017); and Hiatal hernia repair. Family: family history includes Alcohol abuse in her father and mother; Anxiety disorder in her father and mother; Breast cancer in her paternal aunt; COPD in her father; Depression  in her brother, father, and mother; Diabetes in her brother, father, and mother; Hypertension in her brother, father, and mother; Kidney cancer in her mother; Kidney failure in her father; Post-traumatic stress disorder in her father; Sleep apnea in her brother, father, and mother.  Constitutional Exam  General appearance: Well nourished, well developed, and well hydrated. In no apparent acute distress Vitals:   05/17/18 0843  BP: 116/65  Pulse: 82  Resp: 16  Temp: 98.4 F (36.9 C)  TempSrc: Oral  SpO2: 99%  Weight: (!) 389 lb (176.4 kg)  Height: '5\' 4"'  (1.626 m)  Psych/Mental status: Alert, oriented x 3 (person, place, & time)       Eyes: PERLA Respiratory: No evidence of acute respiratory distress  Lumbar Spine Area Exam  Skin & Axial Inspection: No masses, redness, or swelling Alignment: Symmetrical Functional ROM: Unrestricted ROM       Stability: No instability detected Muscle Tone/Strength: Functionally intact. No obvious neuro-muscular anomalies detected. Sensory (Neurological): Unimpaired Palpation: No palpable anomalies       Provocative Tests: Hyperextension/rotation test: deferred today       Lumbar quadrant test (Kemp's test): deferred today       Lateral bending test: deferred today       Patrick's Maneuver: deferred today                   FABER test: deferred today                   S-I anterior distraction/compression test: deferred today         S-I lateral compression test: deferred today         S-I Thigh-thrust test: deferred today         S-I Gaenslen's test: deferred today          Gait & Posture Assessment  Ambulation: Unassisted Gait: Relatively normal for age and body habitus Posture: WNL   Lower Extremity Exam    Side: Right lower extremity  Side: Left lower extremity  Stability: No instability observed          Stability: No instability observed          Skin & Extremity Inspection: Skin color, temperature, and hair growth are WNL. No peripheral  edema or cyanosis. No masses, redness, swelling, asymmetry, or associated skin lesions. No contractures.  Skin & Extremity Inspection: Skin color, temperature, and hair growth are WNL. No peripheral edema or cyanosis. No masses, redness, swelling, asymmetry, or associated skin lesions. No contractures.  Functional ROM: Unrestricted ROM                  Functional ROM: Unrestricted ROM  Muscle Tone/Strength: Functionally intact. No obvious neuro-muscular anomalies detected.  Muscle Tone/Strength: Functionally intact. No obvious neuro-muscular anomalies detected.  Sensory (Neurological): Unimpaired  Sensory (Neurological): Unimpaired  Palpation: Tender  Palpation: No palpable anomalies   Assessment  Primary Diagnosis & Pertinent Problem List: The primary encounter diagnosis was Chronic knee pain (Primary Source of Pain) (Bilateral) (R>L). Diagnoses of Lumbar spondylosis, Fibromyalgia, and Chronic pain syndrome were also pertinent to this visit.  Status Diagnosis  Controlled Controlled Controlled 1. Chronic knee pain (Primary Source of Pain) (Bilateral) (R>L)   2. Lumbar spondylosis   3. Fibromyalgia   4. Chronic pain syndrome     Problems updated and reviewed during this visit: No problems updated. Plan of Care  Pharmacotherapy (Medications Ordered): No orders of the defined types were placed in this encounter.  New Prescriptions   No medications on file   Medications administered today: Thurley A. Womac had no medications administered during this visit. Lab-work, procedure(s), and/or referral(s): No orders of the defined types were placed in this encounter.  Imaging and/or referral(s): None  Interventional therapies: Planned, scheduled, and/or pending: Not at the time.   Considering:  Series of 5 Hyalgan knee injections    Palliative PRN treatment(s):  Intra-articular Hyalgan knee injections.  Palliative bilateral genicular nerve block  Palliative  bilateral lumbar facet block   Provider-requested follow-up: Return for Appointment As Scheduled.  Future Appointments  Date Time Provider [place]  06/12/2018  1:00 PM [place], Amy T, OT [facility] None  06/12/2018  2:00 PM Janna Arch, PT [facility] None  06/15/2018  1:00 PM [place], Amy T, OT [facility] None  06/15/2018  1:45 PM Janna Arch, PT [facility] None  06/20/2018  1:30 PM Harrel Carina, OT [facility] None  06/20/2018  2:15 PM Janna Arch, PT [facility] None  06/22/2018  1:00 PM Harrel Carina, OT [facility] None  06/22/2018  1:45 PM Janna Arch, PT [facility] None  06/26/2018  1:00 PM Janna Arch, PT [facility] None  06/29/2018  1:00 PM Harrel Carina, OT [facility] None  06/29/2018  1:45 PM Janna Arch, PT [facility] None  07/04/2018  1:00 PM Harrel Carina, OT [facility] None  07/04/2018  1:45 PM Janna Arch, PT [facility] None  07/06/2018  1:00 PM Tomasita Morrow, Amy T, OT [facility] None  07/06/2018  1:45 PM Janna Arch, PT [facility] None  07/11/2018  1:00 PM Harrel Carina, OT [facility] None  07/11/2018  1:45 PM Janna Arch, PT [facility] None  07/17/2018 10:30 AM Vevelyn Francois, NP [facility] None   Primary Care Physician: Karen Crouch, MD Location: [facility] Outpatient Pain Management Facility Note by: Vevelyn Francois NP Date: 05/17/2018; Time: 1:18 PM  Pain Score Disclaimer: We use the NRS-11 scale. This is a self-reported, subjective measurement of pain severity with only modest accuracy. It is used primarily to identify changes within a particular patient. It must be understood that outpatient pain scales are significantly less accurate that those used for research, where they can be applied under ideal controlled circumstances with minimal exposure to variables. In reality, the score is likely to be a combination of pain intensity and pain affect, where pain affect describes the degree of emotional arousal or changes in action readiness caused by  the sensory experience of pain. Factors such as social and work situation, setting, emotional state, anxiety levels, expectation, and prior pain experience may influence pain perception and show large inter-individual differences that may also be affected by time variables.  Patient instructions provided during this appointment: Patient Instructions  BMI Assessment: Estimated body mass index is 66.77 kg/m as calculated from the following:   Height as of this encounter: '5\' 4"'  (1.626 m).   Weight as of this encounter: 389 lb (176.4 kg).  BMI interpretation table: BMI level Category Range association with higher incidence of chronic pain  <18 kg/m2 Underweight   18.5-24.9 kg/m2 Ideal body weight   25-29.9 kg/m2 Overweight Increased incidence by 20%  30-34.9 kg/m2 Obese (Class I) Increased incidence by 68%  35-39.9 kg/m2 Severe obesity (Class II) Increased incidence by 136%  >40 kg/m2 Extreme obesity (Class III) Increased incidence by 254%   Patient's current BMI Ideal Body weight  Body mass index is 66.77 kg/m. Ideal body weight: 54.7 kg (120 lb 9.5 oz) Adjusted ideal body weight: 103.4 kg (227 lb 15.3 oz)   BMI Readings from Last 4 Encounters:  05/17/18 66.77 kg/m  04/27/18 66.09 kg/m  04/13/18 65.74 kg/m  03/30/18 65.74 kg/m   Wt Readings from Last 4 Encounters:  05/17/18 (!) 389 lb (176.4 kg)  04/27/18 (!) 385 lb (174.6 kg)  04/13/18 (!) 383 lb (173.7 kg)  03/30/18 (!) 383 lb (173.7 kg)

## 2018-05-17 NOTE — Progress Notes (Signed)
Safety precautions to be maintained throughout the outpatient stay will include: orient to surroundings, keep bed in low position, maintain call bell within reach at all times, provide assistance with transfer out of bed and ambulation.  

## 2018-05-17 NOTE — Patient Instructions (Signed)
  BMI Assessment: Estimated body mass index is 66.77 kg/m as calculated from the following:   Height as of this encounter: 5\' 4"  (1.626 m).   Weight as of this encounter: 389 lb (176.4 kg).  BMI interpretation table: BMI level Category Range association with higher incidence of chronic pain  <18 kg/m2 Underweight   18.5-24.9 kg/m2 Ideal body weight   25-29.9 kg/m2 Overweight Increased incidence by 20%  30-34.9 kg/m2 Obese (Class I) Increased incidence by 68%  35-39.9 kg/m2 Severe obesity (Class II) Increased incidence by 136%  >40 kg/m2 Extreme obesity (Class III) Increased incidence by 254%   Patient's current BMI Ideal Body weight  Body mass index is 66.77 kg/m. Ideal body weight: 54.7 kg (120 lb 9.5 oz) Adjusted ideal body weight: 103.4 kg (227 lb 15.3 oz)   BMI Readings from Last 4 Encounters:  05/17/18 66.77 kg/m  04/27/18 66.09 kg/m  04/13/18 65.74 kg/m  03/30/18 65.74 kg/m   Wt Readings from Last 4 Encounters:  05/17/18 (!) 389 lb (176.4 kg)  04/27/18 (!) 385 lb (174.6 kg)  04/13/18 (!) 383 lb (173.7 kg)  03/30/18 (!) 383 lb (173.7 kg)

## 2018-05-19 ENCOUNTER — Telehealth: Payer: Self-pay | Admitting: Pain Medicine

## 2018-05-19 NOTE — Telephone Encounter (Addendum)
[employer] called to see if we received papers to be filled out and faxed back. Papers were placed at nurses station Fri 05-19-18  [facility] [place]  Use Ref # [id number]

## 2018-05-22 NOTE — Telephone Encounter (Signed)
Called patient and let her know via VM that the request for medical records was taken to medical records by [facility] and that they were going to handle it from here.

## 2018-06-06 DIAGNOSIS — Z9884 Bariatric surgery status: Secondary | ICD-10-CM | POA: Diagnosis not present

## 2018-06-06 DIAGNOSIS — M793 Panniculitis, unspecified: Secondary | ICD-10-CM | POA: Diagnosis not present

## 2018-06-12 ENCOUNTER — Other Ambulatory Visit: Payer: Self-pay

## 2018-06-12 ENCOUNTER — Ambulatory Visit: Payer: Medicare HMO | Attending: Internal Medicine | Admitting: Occupational Therapy

## 2018-06-12 ENCOUNTER — Encounter: Payer: Self-pay | Admitting: Occupational Therapy

## 2018-06-12 ENCOUNTER — Ambulatory Visit: Payer: Medicare HMO | Admitting: Physical Therapy

## 2018-06-12 VITALS — BP 124/79 | HR 91

## 2018-06-12 DIAGNOSIS — M6281 Muscle weakness (generalized): Secondary | ICD-10-CM

## 2018-06-12 DIAGNOSIS — R2689 Other abnormalities of gait and mobility: Secondary | ICD-10-CM | POA: Diagnosis not present

## 2018-06-12 DIAGNOSIS — R262 Difficulty in walking, not elsewhere classified: Secondary | ICD-10-CM | POA: Insufficient documentation

## 2018-06-12 DIAGNOSIS — R278 Other lack of coordination: Secondary | ICD-10-CM | POA: Diagnosis not present

## 2018-06-12 NOTE — Therapy (Signed)
[place] MAIN [facility] SERVICES [address] [place], [place], [address] Phone: [phone]   Fax:  [phone]  Occupational Therapy Evaluation  Patient Details  Name: Karen Lewis MRN: [id number] Date of Birth: 1971-07-18 No data recorded  Encounter Date: 06/12/2018    Past Medical History:  Diagnosis Date  . Anemia   . Anginal pain ([place])   . Anxiety   . Arthralgia of hip 07/29/2015  . Arthritis   . Arthritis, degenerative 07/29/2015  . Asthma   . Cephalalgia 07/25/2014  . Dependence on unknown drug ([place])    multiplt controlled drug dependence  . Depression   . Diabetes mellitus without complication ([place])   . Dysrhythmia   . Eczema   . Fibromyalgia   . Gastritis   . GERD (gastroesophageal reflux disease)   . Gonalgia 07/29/2015   Overview:  Overview:  The patient has had bilateral intra-articular Hyalgan injections done on 07/16/2014 and although she seems to do well with this type of therapy, apparently her insurance company does not want to pay for they Hyalgan. On 11/27/2014 the patient underwent a bilateral genicular nerve block with excellent results. On 01/28/2015 she had a right knee genicular radiofrequency ablatio  . Gout   . H/O cardiovascular disorder 03/10/2015  . H/O surgical procedure 12/05/2012   Overview:  LSG ([facility] - April 2013)    . H/O thyroid disease 03/10/2015  . Headache   . Herpes   . History of artificial joint 07/29/2015  . History of hiatal hernia   . Hypertension   . Hypomagnesemia   . Hypothyroidism   . LBP (low back pain) 07/29/2015  . Neuromuscular disorder ([place])   . Obesity   . PCOS (polycystic ovarian syndrome)   . Primary osteoarthritis of both knees 07/29/2015  . Sleep apnea   . Thyroid nodule bilateral    Past Surgical History:  Procedure Laterality Date  . CARPAL TUNNEL RELEASE Bilateral   . CHOLECYSTECTOMY    . DIAGNOSTIC LAPAROSCOPY    . HIATAL HERNIA REPAIR    . JOINT REPLACEMENT  Left hip  . LAPAROSCOPIC PARTIAL GASTRECTOMY    . left trigger finger    . ROUX-EN-Y GASTRIC BYPASS  06/03/2017  . SHOULDER ARTHROSCOPY Right   . THYROIDECTOMY N/A 11/12/2015   Procedure: THYROIDECTOMY;  Surgeon: Clyde Canterbury, MD;  Location: [facility] ORS;  Service: ENT;  Laterality: N/A;  . TRIGGER FINGER RELEASE Right     There were no vitals filed for this visit.  Subjective Assessment - 06/12/18 1314    Subjective   Patient reports she would like to work on muscle without causing pain to her joints.     Currently in Pain?  Yes    Pain Score  4     Pain Location  Knee    Pain Orientation  Right    Pain Descriptors / Indicators  Sharp    Pain Type  Chronic pain    Pain Onset  More than a month ago    Pain Frequency  Intermittent    Multiple Pain Sites  Yes    Pain Score  4    Pain Location  Back    Pain Orientation  Mid;Lower    Pain Descriptors / Indicators  Aching;Burning    Pain Type  Chronic pain    Pain Onset  More than a month ago    Pain Frequency  Intermittent        [facility] OT Assessment - 06/12/18  1317      Assessment   Prior Therapy  PT, OT      Balance Screen   Has the patient fallen in the past 6 months  No    Has the patient had a decrease in activity level because of a fear of falling?   Yes    Is the patient reluctant to leave their home because of a fear of falling?   Yes      Home  Environment   Family/patient expects to be discharged to:  Private residence    Living Arrangements  Spouse/significant other    Available Help at Discharge  Family    Type of [place] entrance    [place]  One level    Bathroom Shower/Tub  Walk-in Advertising account planner seat;Grab bars - tub/shower;Hand held Tourist information centre manager - 4 wheels    Lives With  Significant other      Prior Function   Level of [place]  On disability    Leisure   read, crochet, cross stitch, fosters kittens      ADL   Eating/Feeding  Modified independent    Grooming  Modified independent    Upper Body Bathing  Modified independent    Lower Body Bathing  Use of adaptive device    Upper Body Dressing  Independent    Lower Body Dressing  Other (comment)   occasional assist for socks   Toilet Transfer  Modified independent    Toileting - Clothing Manipulation  Modified independent    Tub/Shower Transfer  Modified independent      IADL   Shopping  Takes care of all shopping needs independently    Light Housekeeping  Needs help with all home maintenance tasks;Performs light daily tasks such as dishwashing, bed making    Meal Prep  Able to complete simple warm meal prep    Community Mobility  Drives own vehicle    Medication Management  Is responsible for taking medication in correct dosages at correct time    Psychiatrist financial matters independently (budgets, writes checks, pays rent, bills goes to bank), collects and keeps track of income      Mobility   Mobility Status  Needs assist    Mobility Status Comments  use of rollator in the kitchen, able to walk from bedroom to bathroom or to [employer]' t use assistive device.       Written Expression   Dominant Hand  Right      Vision - History   Baseline Vision  Wears glasses all the time      Cognition   Overall Cognitive Status  Within Functional Limits for tasks assessed      Coordination   Gross Motor Movements are Fluid and Coordinated  Yes    Fine Motor Movements are Fluid and Coordinated  Yes    9 Hole Peg Test  Right;Left    Right 9 Hole Peg Test  20 secs    Left 9 Hole Peg Test  22 secs      ROM / Strength   AROM / PROM / Strength  AROM;Strength      Hand Function   Right Hand Grip (lbs)  50    Right Hand Lateral Pinch  15 lbs  Right Hand 3 Point Pinch  11 lbs    Left Hand Grip (lbs)  45    Left Hand Lateral Pinch  14 lbs    Left 3 point pinch  12  lbs    Comment  2 point right 11#, left 11#                                    Patient will benefit from skilled therapeutic intervention in order to improve the following deficits and impairments:     Visit Diagnosis: Muscle weakness (generalized)  Other lack of coordination    Problem List Patient Active Problem List   Diagnosis Date Noted  . Allergy to iodine 04/26/2018  . H/O allergy to shellfish 04/26/2018  . Encounter for dental examination 02/21/2018  . Spondylosis without myelopathy or radiculopathy, lumbosacral region 02/21/2018  . History of gastric bypass 02/15/2018  . Depression 01/19/2018  . Secondary Osteoarthritis of knee (Bilateral) (R>L) 01/02/2018  . Encounter for dental exam and cleaning w/o abnormal findings 12/19/2017  . Post-operative hypothyroidism 12/05/2017  . Osteoarthritis of hip (Right) 07/07/2017  . Osteoarthritis of shoulder (Left) 05/31/2017  . Upper extremity pain 04/21/2017  . Chronic shoulder pain (Left) 04/21/2017  . Chronic pain syndrome 12/02/2016  . Neurogenic pain 12/02/2016  . Chest pain with low risk of acute coronary syndrome 08/04/2016  . SOB (shortness of breath) on exertion 08/04/2016  . Dysphagia, unspecified 05/04/2016  . Gastroesophageal reflux disease without esophagitis 05/04/2016  . Vitamin D insufficiency 11/27/2015  . Nontoxic goiter 11/12/2015  . Other psychoactive substance dependence, uncomplicated ([place]) 24/40/1027  . Controlled drug dependence ([place]) 10/13/2015  . Lumbar spondylosis 09/18/2015  . Hypomagnesemia 08/27/2015  . Morbid obesity with body mass index (BMI) greater than or equal to 70 in adult [facility]) 08/27/2015  . Long term current use of opiate analgesic 07/29/2015  . Long term prescription opiate use 07/29/2015  . Opiate use 07/29/2015  . Encounter for therapeutic drug level monitoring 07/29/2015  . Opiate dependence ([place]) 07/29/2015  . Chronic knee pain (Primary Source of  Pain) (Bilateral) (R>L) 07/29/2015  . Chronic low back pain [facility] source of pain) (Bilateral) (R>L) 07/29/2015  . Lumbar facet syndrome (Bilateral) (R>L) 07/29/2015  . Osteoarthritis 07/29/2015  . Grade 1 (1.4 cm) Anterolisthesis of L4 over L5 07/29/2015  . Chronic hip pain (Secondary source of pain) (Right) 07/29/2015  . S/P THR (total hip replacement) (Left) 07/29/2015  . History of methicillin resistant staphylococcus aureus (MRSA) 07/29/2015  . History of bariatric surgery 07/29/2015  . History of methicillin resistant Staphylococcus aureus infection 07/29/2015  . Eczema 05/21/2015  . Gout 05/21/2015  . Headache, migraine 05/21/2015  . Bilateral polycystic ovarian syndrome 05/21/2015  . Disease of thyroid gland 05/21/2015  . Major depressive disorder, single episode 05/21/2015  . Dermatitis 05/21/2015  . Obstructive apnea 04/30/2015  . History of asthma 03/10/2015  . Binge eating disorder 03/10/2015  . Fibromyalgia 03/10/2015  . Anxiety, generalized 03/10/2015  . Insomnia, persistent 03/10/2015  . Depression, major, recurrent, moderate ([place]) 03/10/2015  . Avitaminosis D 04/30/2014  . Hyperlipidemia 04/25/2014  . Hyperlipidemia due to type 2 diabetes mellitus ([place]) 04/25/2014  . Uncomplicated asthma 25/36/6440  . Asthma 03/19/2014  . Goiter, nontoxic, multinodular 10/09/2013  . Apnea, sleep 12/05/2012  . History of surgical procedure 12/05/2012  . Type 2 diabetes mellitus without complication, without long-term current use of insulin ([place]) 12/05/2012  .  Hypertension associated with diabetes ([place]) 12/05/2012   Karen Lewis T Noha Karasik, OTR/L, CLT  Karen Lewis 06/12/2018, 8:15 PM  [place] MAIN [facility] SERVICES [address] [place], [place], [address] Phone: [phone]   Fax:  [phone]  Name: Karen Lewis MRN: [id number] Date of Birth: 1971/06/12

## 2018-06-12 NOTE — Patient Instructions (Signed)
Access Code: [email or web address]  URL: [email or web address]  Date: 06/12/2018  Prepared by: Collie Siad   Exercises  Standing March with Counter Support - 10 reps - 2 sets - 2x daily - 7x weekly  Standing Hip Abduction with Counter Support - 10 reps - 2 sets - 2x daily - 7x weekly  Forward and Backward Walking - 20 reps - 2 sets - 2x daily - 7x weekly  Seated Long Arc Quad - 10 reps - 2 sets - 2x daily - 7x weekly

## 2018-06-12 NOTE — Therapy (Signed)
[place] MAIN [facility] SERVICES [address] [place], [place], [address] Phone: [phone]   Fax:  [phone]  Physical Therapy Evaluation  Patient Details  Name: Karen Lewis MRN: [id number] Date of Birth: [birth year] Referring Provider (PT): Nicolette Bang (MD)   Encounter Date: 06/12/2018  PT End of Session - 06/12/18 1526    Visit Number  1    Number of Visits  11    Date for PT Re-Evaluation  08/21/18    Authorization Type  $40 copay    Authorization Time Period  progress note 1/10; start of reporting period 9/30    PT Start Time  1354    PT Stop Time  1452    PT Time Calculation (min)  58 min    Equipment Utilized During Treatment  Gait belt   two pink gait belts connected together (bariatric gait belt)   Activity Tolerance  Patient limited by pain;Patient limited by fatigue    Behavior During Therapy  [facility] for tasks assessed/performed       Past Medical History:  Diagnosis Date  . Anemia   . Anginal pain ([place])   . Anxiety   . Arthralgia of hip 07/29/2015  . Arthritis   . Arthritis, degenerative 07/29/2015  . Asthma   . Cephalalgia 07/25/2014  . Dependence on unknown drug ([place])    multiplt controlled drug dependence  . Depression   . Diabetes mellitus without complication ([place])   . Dysrhythmia   . Eczema   . Fibromyalgia   . Gastritis   . GERD (gastroesophageal reflux disease)   . Gonalgia 07/29/2015   Overview:  Overview:  The patient has had bilateral intra-articular Hyalgan injections done on 07/16/2014 and although she seems to do well with this type of therapy, apparently her insurance company does not want to pay for they Hyalgan. On 11/27/2014 the patient underwent a bilateral genicular nerve block with excellent results. On 01/28/2015 she had a right knee genicular radiofrequency ablatio  . Gout   . H/O cardiovascular disorder 03/10/2015  . H/O surgical procedure 12/05/2012   Overview:  LSG ([facility] - April 2013)     . H/O thyroid disease 03/10/2015  . Headache   . Herpes   . History of artificial joint 07/29/2015  . History of hiatal hernia   . Hypertension   . Hypomagnesemia   . Hypothyroidism   . LBP (low back pain) 07/29/2015  . Neuromuscular disorder ([place])   . Obesity   . PCOS (polycystic ovarian syndrome)   . Primary osteoarthritis of both knees 07/29/2015  . Sleep apnea   . Thyroid nodule bilateral    Past Surgical History:  Procedure Laterality Date  . CARPAL TUNNEL RELEASE Bilateral   . CHOLECYSTECTOMY    . DIAGNOSTIC LAPAROSCOPY    . HIATAL HERNIA REPAIR    . JOINT REPLACEMENT Left hip  . LAPAROSCOPIC PARTIAL GASTRECTOMY    . left trigger finger    . ROUX-EN-Y GASTRIC BYPASS  06/03/2017  . SHOULDER ARTHROSCOPY Right   . THYROIDECTOMY N/A 11/12/2015   Procedure: THYROIDECTOMY;  Surgeon: Clyde Canterbury, MD;  Location: [facility] ORS;  Service: ENT;  Laterality: N/A;  . TRIGGER FINGER RELEASE Right     Vitals:   06/12/18 1400  BP: 124/79  Pulse: 91     Subjective Assessment - 06/12/18 1400    Subjective  Pt reports generalized weakness and deconditioning    Patient is accompained by:  --   Karen Lewis  Pertinent History  Pt is a 47 y/o F who presents with generalized weakness and deconditioning.  Pt with chronic pain in knees/hips/shoulders/back with some success from therapy in the past.  This pain limits her mobility.  Pt reports she uses her rollator in kitchen if she is doing laundry, cooking, washing dishes.  She is able to ambulate household distances without AD.  Has a scooter for the community. Goals: walk down aisle at her wedding in May, to stand and cook dinner, to be able to walk longer distances, to be able to plant flowers in her yard.  Pt reports h/o depression, on medication, reports she will be finding new counselor once her bariatric surgery is complete.  Pt with h/o gastric sleeve surgery, fibromyalgia, anterolisthesis of L4 over L5, L THA, OA of R hip.    Limitations   Standing;Walking;House hold activities    How long can you stand comfortably?  5 minutes    How long can you walk comfortably?  10 meters    Patient Stated Goals  see above    Currently in Pain?  Yes    Pain Score  4     Pain Location  Back    Pain Orientation  Mid    Pain Descriptors / Indicators  Aching    Pain Type  Chronic pain    Pain Onset  More than a month ago    Multiple Pain Sites  Yes    Pain Score  4    Pain Location  Knee    Pain Orientation  Right    Pain Descriptors / Indicators  Aching    Pain Type  Chronic pain    Pain Onset  More than a month ago         [facility] PT Assessment - 06/12/18 1403      Assessment   Medical Diagnosis  generalized muscle weakness, primary osteoarthritis involving multiple joints, fibromyalgia, and physical deconditioning    Referring Provider (PT)  Nicolette Bang (MD)    Onset Date/Surgical Date  01/10/18   Most noticeable over the past couple of months   Hand Dominance  Right    Next MD Visit  Oct or Nov    Prior Therapy  Yes, PT, OT      Precautions   Precautions  Fall      Restrictions   Weight Bearing Restrictions  No      Balance Screen   Has the patient fallen in the past 6 months  No    Has the patient had a decrease in activity level because of a fear of falling?   Yes    Is the patient reluctant to leave their home because of a fear of falling?   Yes   because of her weakness     Home Environment   Living Environment  Private residence    Living Arrangements  Spouse/significant other   Karen Lewis   Available Help at Discharge  Family;Available PRN/intermittently    Type of [place] entrance    Home Layout  One level    [place] bars - tub/shower;Tub bench;Walker - 4 wheels;Walker - 2 wheels;Electric scooter      Prior Function   Level of Independence  Independent with household mobility without device   modI with ADLs   Vocation  On disability   used to be a  Marine scientist   Leisure  read, crochet, cross  stitch, fosters kittens      Cognition   Overall Cognitive Status  Within Functional Limits for tasks assessed      ROM / Strength   AROM / PROM / Strength  Strength      Strength   Overall Strength  Deficits    Strength Assessment Site  Hip;Knee;Ankle    Right/Left Hip  Right;Left    Right Hip Flexion  3/5    Right Hip ABduction  4-/5    Right Hip ADduction  4-/5    Left Hip Flexion  3/5    Left Hip ABduction  4-/5    Left Hip ADduction  4-/5    Right/Left Knee  Right;Left    Right Knee Flexion  4/5    Right Knee Extension  4/5    Left Knee Flexion  4/5    Left Knee Extension  4/5    Right/Left Ankle  Right;Left    Right Ankle Dorsiflexion  5/5    Left Ankle Dorsiflexion  5/5      Balance   Balance Assessed  Yes      Standardized Balance Assessment   Standardized Balance Assessment  Berg Balance Test      Berg Balance Test   Sit to Stand  Able to stand  independently using hands    Standing Unsupported  Able to stand 2 minutes with supervision    Sitting with Back Unsupported but Feet Supported on Floor or Stool  Able to sit safely and securely 2 minutes    Stand to Sit  Controls descent by using hands    Transfers  Able to transfer safely, definite need of hands    Standing Unsupported with Eyes Closed  Able to stand 10 seconds safely    Standing Ubsupported with Feet Together  Able to place feet together independently and stand for 1 minute with supervision    From Standing, Reach Forward with Outstretched Arm  Can reach forward >12 cm safely (5")    From Standing Position, Pick up Object from Floor  Able to pick up shoe, needs supervision    From Standing Position, Turn to Look Behind Over each Shoulder  Looks behind one side only/other side shows less weight shift    Turn 360 Degrees  Able to turn 360 degrees safely but slowly    Standing Unsupported, Alternately Place Feet on Step/Stool  Needs assistance to keep from falling or  unable to try    Standing Unsupported, One Foot in [employer] balance while stepping or standing    Standing on One Leg  Unable to try or needs assist to prevent fall    Total Score  34    Berg comment:  High risk for falls       EXAMINATION   37mwT: 0.53 m/s without AD   Ambulatory endurance testing: 10 meters (pt reports inability to ambulate farther than this without rest break)   5xSTS from mat table: 15.28 sec using light BUE support   Berg Balance Test: 34/56, high risk of falling   Sensation: Dec sensation to light touch R thigh (pt reports this is chronic from back pain), Bil plantar surfaces of feet due to diabetic neuropathy   Gait Analysis: wide BOS, increased lateral sway bil, dec trunk rotation Bil, slightly flexed posture      TREATMENT  Marching in standing x10 each LE with BUE support (added to HEP)   Standing hip abduction x10 on the R, x3 on the  L (limited by pain). (added to HEP, to perform to pt's tolerance)   LAQ x10 each LE (added to HEP)   Forward and backward walking added to HEP (not performed this session as pt reports she is already doing this at home)   Specific instructions to progressively increase endurance with each HEP activity each week.           Objective measurements completed on examination: See above findings.     PT Education - 06/12/18 1526    Education Details  HEP and handout; role of PT; POC    Person(s) Educated  Patient    Methods  Explanation;Demonstration;Verbal cues;Handout    Comprehension  Verbalized understanding;Returned demonstration;Verbal cues required;Need further instruction       PT Short Term Goals - 06/12/18 1528      PT SHORT TERM GOAL #1   Title  Pt will complete HEP at least 4 days/wk for improved endurance, strength, and independence    Time  2    Period  Weeks    Status  New        PT Long Term Goals - 06/12/18 1528      PT LONG TERM GOAL #1   Title  Pt's 5xSTS time will improve  to equal or less than 13 seconds without UE assist    Baseline  15.28 sec with light BUE assist    Time  6    Period  Weeks    Status  New      PT LONG TERM GOAL #2   Title  Pt will be able to stand long enough to cook simple meals and to wash dishes without rest breaks     Baseline  Unable    Time  8    Period  Weeks    Status  New      PT LONG TERM GOAL #3   Title  Pt's 27mWT will improve to equal to or greater than 1.0 m/s for improved safety ambulating in home and community    Baseline  0.53 m/s    Time  10    Period  Weeks    Status  New      PT LONG TERM GOAL #4   Title  Pt's Berg Balance Test will improve to equal to or greater than 46/56 to demonstrate improved balance    Baseline  34/56    Time  10    Period  Weeks    Status  New      PT LONG TERM GOAL #5   Title  Pt will be able to ambulate at least 150 ft without AD without rest breaks for improved ambulatory endurance and to be able to walk down the aisle at her wedding in May 2020    Baseline  10 meters    Time  8    Period  Weeks    Status  New             Plan - 06/12/18 1546    Clinical Impression Statement  Pt is a 47 y/o F who presents with generalized weakness and deconditioning.  Pt demonstrates some weakness in BLEs; however, main limitation appears to be with her endurance.  Pt's 62mWT indicates she is a household ambulator.  Pt's Berg Balance Test indicates the pt is at a high risk of falling.  Pt's ambulatory endurance is limited to 10 meters, will work toward pt's goal of walking down the aisle at her wedding in  May.  Pt requires BUE assist for 5xSTS test, indicating decreased BLE strength and impaired balance.  Pt was instructed in HEP program with emphasis placed on increasing reps, sets, as she is able for improved endurance.  Pt to trial aquatic therapy to allow her to establish an aquatic HEP for her to perform at her [employer].  Pt will benefit from skilled PT interventions for improved strength,  endurance, and independence with functional activities.      History and Personal Factors relevant to plan of care:  (+) Pt very motivated, suportive fiance, active with her health and advocate for herself in the healthcare setting  (-) Pt's chronic pain/fibromyalgia, pt's perceived weakness, pt's body habitus    Clinical Presentation  Unstable    Clinical Presentation due to:  Pt currently in the process of completing surgical interventions for weight loss, endurance and strength has decreased gradually over the past few months    Clinical Decision Making  High    Rehab Potential  Fair    PT Frequency  1x / week    PT Duration  Other (comment)   10 weeks   PT Treatment/Interventions  ADLs/Self Care Home Management;Aquatic Therapy;Cryotherapy;Electrical Stimulation;Iontophoresis 4mg /ml Dexamethasone;Moist Heat;Ultrasound;DME Instruction;Gait training;Stair training;Functional mobility training;Therapeutic exercise;Therapeutic activities;Balance training;Neuromuscular re-education;Patient/family education;Manual techniques;Passive range of motion;Dry needling;Energy conservation;Splinting;Taping    PT Next Visit Plan  Assess response to HEP; provide pt with information from SW regarding resources in her area for depression; advance HEP as appropriate    PT Home Exercise Plan  LAQ, forward and backward walking, standing hip abduction, marching in standing    Recommended Other Services  aquatic therapy; this therapist to call SW to request information on local resources    Consulted and Agree with Plan of Care  Patient       Patient will benefit from skilled therapeutic intervention in order to improve the following deficits and impairments:  Abnormal gait, Decreased activity tolerance, Decreased balance, Decreased endurance, Decreased knowledge of use of DME, Decreased mobility, Decreased range of motion, Decreased safety awareness, Decreased strength, Difficulty walking, Hypomobility, Increased  fascial restricitons, Increased muscle spasms, Impaired perceived functional ability, Impaired flexibility, Impaired sensation, Impaired UE functional use, Improper body mechanics, Postural dysfunction, Pain, Obesity  Visit Diagnosis: Muscle weakness (generalized)  Other abnormalities of gait and mobility  Difficulty in walking, not elsewhere classified     Problem List Patient Active Problem List   Diagnosis Date Noted  . Allergy to iodine 04/26/2018  . H/O allergy to shellfish 04/26/2018  . Encounter for dental examination 02/21/2018  . Spondylosis without myelopathy or radiculopathy, lumbosacral region 02/21/2018  . History of gastric bypass 02/15/2018  . Depression 01/19/2018  . Secondary Osteoarthritis of knee (Bilateral) (R>L) 01/02/2018  . Encounter for dental exam and cleaning w/o abnormal findings 12/19/2017  . Post-operative hypothyroidism 12/05/2017  . Osteoarthritis of hip (Right) 07/07/2017  . Osteoarthritis of shoulder (Left) 05/31/2017  . Upper extremity pain 04/21/2017  . Chronic shoulder pain (Left) 04/21/2017  . Chronic pain syndrome 12/02/2016  . Neurogenic pain 12/02/2016  . Chest pain with low risk of acute coronary syndrome 08/04/2016  . SOB (shortness of breath) on exertion 08/04/2016  . Dysphagia, unspecified 05/04/2016  . Gastroesophageal reflux disease without esophagitis 05/04/2016  . Vitamin D insufficiency 11/27/2015  . Nontoxic goiter 11/12/2015  . Other psychoactive substance dependence, uncomplicated ([place]) 63/87/5643  . Controlled drug dependence ([place]) 10/13/2015  . Lumbar spondylosis 09/18/2015  . Hypomagnesemia 08/27/2015  . Morbid obesity with body mass index (  BMI) greater than or equal to 70 in adult [facility]) 08/27/2015  . Long term current use of opiate analgesic 07/29/2015  . Long term prescription opiate use 07/29/2015  . Opiate use 07/29/2015  . Encounter for therapeutic drug level monitoring 07/29/2015  . Opiate dependence ([place])  07/29/2015  . Chronic knee pain (Primary Source of Pain) (Bilateral) (R>L) 07/29/2015  . Chronic low back pain [facility] source of pain) (Bilateral) (R>L) 07/29/2015  . Lumbar facet syndrome (Bilateral) (R>L) 07/29/2015  . Osteoarthritis 07/29/2015  . Grade 1 (1.4 cm) Anterolisthesis of L4 over L5 07/29/2015  . Chronic hip pain (Secondary source of pain) (Right) 07/29/2015  . S/P THR (total hip replacement) (Left) 07/29/2015  . History of methicillin resistant staphylococcus aureus (MRSA) 07/29/2015  . History of bariatric surgery 07/29/2015  . History of methicillin resistant Staphylococcus aureus infection 07/29/2015  . Eczema 05/21/2015  . Gout 05/21/2015  . Headache, migraine 05/21/2015  . Bilateral polycystic ovarian syndrome 05/21/2015  . Disease of thyroid gland 05/21/2015  . Major depressive disorder, single episode 05/21/2015  . Dermatitis 05/21/2015  . Obstructive apnea 04/30/2015  . History of asthma 03/10/2015  . Binge eating disorder 03/10/2015  . Fibromyalgia 03/10/2015  . Anxiety, generalized 03/10/2015  . Insomnia, persistent 03/10/2015  . Depression, major, recurrent, moderate ([place]) 03/10/2015  . Avitaminosis D 04/30/2014  . Hyperlipidemia 04/25/2014  . Hyperlipidemia due to type 2 diabetes mellitus ([place]) 04/25/2014  . Uncomplicated asthma 31/51/7616  . Asthma 03/19/2014  . Goiter, nontoxic, multinodular 10/09/2013  . Apnea, sleep 12/05/2012  . History of surgical procedure 12/05/2012  . Type 2 diabetes mellitus without complication, without long-term current use of insulin ([place]) 12/05/2012  . Hypertension associated with diabetes ([place]) 12/05/2012    Karen Lewis PT, DPT 06/12/2018, 3:54 PM  [place] MAIN [facility] SERVICES [address] [place], [place], [address] Phone: [phone]   Fax:  [phone]  Name: Karen Lewis MRN: [id number] Date of Birth: 07-13-1971

## 2018-06-13 NOTE — Therapy (Signed)
[place] MAIN [facility] SERVICES [address] [place], [place], [address] Phone: [phone]   Fax:  [phone]  Occupational Therapy Evaluation  Patient Details  Name: Karen Lewis MRN: [id number] Date of Birth: Jan 23, 1971 No data recorded  Encounter Date: 06/12/2018  OT End of Session - 06/13/18 1436    Visit Number  1    Number of Visits  10    Date for OT Re-Evaluation  08/22/18    Authorization Type  Medicare visit 1/10    OT Start Time  1302    OT Stop Time  1355    OT Time Calculation (min)  53 min    Activity Tolerance  Patient tolerated treatment well    Behavior During Therapy  [facility] for tasks assessed/performed       Past Medical History:  Diagnosis Date  . Anemia   . Anginal pain ([place])   . Anxiety   . Arthralgia of hip 07/29/2015  . Arthritis   . Arthritis, degenerative 07/29/2015  . Asthma   . Cephalalgia 07/25/2014  . Dependence on unknown drug ([place])    multiplt controlled drug dependence  . Depression   . Diabetes mellitus without complication ([place])   . Dysrhythmia   . Eczema   . Fibromyalgia   . Gastritis   . GERD (gastroesophageal reflux disease)   . Gonalgia 07/29/2015   Overview:  Overview:  The patient has had bilateral intra-articular Hyalgan injections done on 07/16/2014 and although she seems to do well with this type of therapy, apparently her insurance company does not want to pay for they Hyalgan. On 11/27/2014 the patient underwent a bilateral genicular nerve block with excellent results. On 01/28/2015 she had a right knee genicular radiofrequency ablatio  . Gout   . H/O cardiovascular disorder 03/10/2015  . H/O surgical procedure 12/05/2012   Overview:  LSG ([facility] - April 2013)    . H/O thyroid disease 03/10/2015  . Headache   . Herpes   . History of artificial joint 07/29/2015  . History of hiatal hernia   . Hypertension   . Hypomagnesemia   . Hypothyroidism   . LBP (low back pain) 07/29/2015  .  Neuromuscular disorder ([place])   . Obesity   . PCOS (polycystic ovarian syndrome)   . Primary osteoarthritis of both knees 07/29/2015  . Sleep apnea   . Thyroid nodule bilateral    Past Surgical History:  Procedure Laterality Date  . CARPAL TUNNEL RELEASE Bilateral   . CHOLECYSTECTOMY    . DIAGNOSTIC LAPAROSCOPY    . HIATAL HERNIA REPAIR    . JOINT REPLACEMENT Left hip  . LAPAROSCOPIC PARTIAL GASTRECTOMY    . left trigger finger    . ROUX-EN-Y GASTRIC BYPASS  06/03/2017  . SHOULDER ARTHROSCOPY Right   . THYROIDECTOMY N/A 11/12/2015   Procedure: THYROIDECTOMY;  Surgeon: Clyde Canterbury, MD;  Location: [facility] ORS;  Service: ENT;  Laterality: N/A;  . TRIGGER FINGER RELEASE Right     There were no vitals filed for this visit.  Subjective Assessment - 06/13/18 1428    Subjective   Patient reports she would like to work on muscle without causing pain to her joints. Patients personal Goals: walk down aisle at her wedding in May, to stand and cook dinner, to be able to walk longer distances, to be able to plant flowers in her yard, carry small bags of groceries into the house, complete laundry.    Pertinent History  Pt is a  47 y/o F who presents with generalized muscle weakness and deconditioning.  Pt with chronic pain in knees/hips/shoulders/back with some success from therapy in the past.  This pain limits her mobility as well as participation in daily tasks.  Pt reports she uses her rollator in kitchen if she is doing laundry, cooking, washing dishes and sits to perform all activities.  She is able to ambulate household distances without AD.  Has a scooter for the community. Patients personal Goals: walk down aisle at her wedding in May, to stand and cook dinner, to be able to walk longer distances, to be able to plant flowers in her yard, carry small bags of groceries into the house, complete laundry.  Pt reports h/o depression, on medication, reports she will be finding new counselor once her  bariatric surgery is complete.  Pt with h/o gastric sleeve surgery, fibromyalgia, anterolisthesis of L4 over L5, L THA, OA of R hip.  She has mentioned she may have additional surgery in the near future for removal of excess skin.      Patient Stated Goals  To be able to stand and cook dinner, to be able to plant flowers in her yard, carry small bags of groceries into the house, complete laundry.    Currently in Pain?  Yes    Pain Score  4     Pain Location  Back    Pain Orientation  Mid    Pain Descriptors / Indicators  Aching    Pain Onset  More than a month ago    Pain Frequency  Intermittent    Multiple Pain Sites  Yes    Pain Score  4    Pain Location  Knee    Pain Orientation  Right    Pain Descriptors / Indicators  Aching    Pain Type  Chronic pain    Pain Onset  More than a month ago    Pain Frequency  Intermittent        [facility] OT Assessment - 06/13/18 1431      Assessment   Medical Diagnosis  generalized muscle weakness, primary osteoarthritis involving multiple joints, fibromyalgia, and physical deconditioning    Onset Date/Surgical Date  01/10/18    Hand Dominance  Right    Next MD Visit  Oct or Nov    Prior Therapy  PT, OT      Precautions   Precautions  Fall      Restrictions   Weight Bearing Restrictions  No      Balance Screen   Has the patient fallen in the past 6 months  No    Has the patient had a decrease in activity level because of a fear of falling?   Yes    Is the patient reluctant to leave their home because of a fear of falling?   Yes      Home  Environment   Family/patient expects to be discharged to:  Private residence    Living Arrangements  Spouse/significant other    Available Help at Discharge  Family    Type of [place]  One level    Bathroom Shower/Tub  Walk-in Advertising account planner seat;Grab bars -  tub/shower;Hand held Tourist information centre manager - 4 wheels    Lives With  Significant other  Prior Function   Level of Independence  Independent    Vocation  On disability    Leisure  read, crochet, cross stitch, fosters kittens      ADL   Eating/Feeding  Modified independent    Grooming  Modified independent    Upper Body Bathing  Modified independent    Lower Body Bathing  Use of adaptive device    Upper Body Dressing  Independent    Lower Body Dressing  Other (comment)   occasional assist for socks   Toilet Transfer  Modified independent    Toileting - Clothing Manipulation  Modified independent    Tub/Shower Transfer  Modified independent    ADL comments  Patient reports having to sit for all activities, she is only up a limited amount of time each day and otherwise stays in the bed. She estimates being up for 1.5 hours a day in small increments.  She performs all self care in sitting, assist with laundry, carrying groceries, taking out the trash, cooking.  She reports she drops items all the time.        IADL   Prior Level of Function Shopping  independent    Shopping  Takes care of all shopping needs independently    Prior Level of Function Light Housekeeping  independent    Light Housekeeping  Needs help with all home maintenance tasks;Performs light daily tasks such as dishwashing, bed making    Prior Level of Function Meal Prep  independent    Meal Prep  Able to complete simple warm meal prep    Prior Level of Function Metallurgist own vehicle    Prior Level of Function Medication Managment  independent    Medication Management  Is responsible for taking medication in correct dosages at correct time    Prior Level of Function Loss adjuster, chartered financial matters independently (budgets, writes checks, pays rent, bills goes to bank), collects and keeps track of income      Mobility    Mobility Status  Needs assist    Mobility Status Comments  use of rollator in the kitchen, able to walk from bedroom to bathroom or to [employer]' t use assistive device.       Written Expression   Dominant Hand  Right      Vision - History   Baseline Vision  Wears glasses all the time      Cognition   Overall Cognitive Status  Within Functional Limits for tasks assessed      Coordination   Gross Motor Movements are Fluid and Coordinated  Yes    Fine Motor Movements are Fluid and Coordinated  Yes    9 Hole Peg Test  Right;Left    Right 9 Hole Peg Test  20 secs    Left 9 Hole Peg Test  22 secs      ROM / Strength   AROM / PROM / Strength  AROM;Strength      AROM   Overall AROM   Within functional limits for tasks performed      Strength   Overall Strength  Deficits    Overall Strength Comments  BUE shoulder flexion 4/5, elbow 4/5, wrist 5/5.        Hand Function   Right Hand Grip (lbs)  50    Right Hand Lateral Pinch  15 lbs    Right Hand 3  Point Pinch  11 lbs    Left Hand Grip (lbs)  45    Left Hand Lateral Pinch  14 lbs    Left 3 point pinch  12 lbs    Comment  2 point right 11#, left 11#         Patient has a history of bilateral carpal tunnel with release, trigger thumb and right ring trigger finger with surgical intervention Some left numbness down the arm and arthritis in bilateral shoulders.  R shoulder with history of impairments with surgical intervention.   Patient reports she Drops items all the time.              OT Education - 06/13/18 1435    Education Details  POC, goals, Role of OT    Person(s) Educated  Patient;Spouse    Methods  Explanation    Comprehension  Verbalized understanding          OT Long Term Goals - 06/13/18 1448      OT LONG TERM GOAL #1   Title  Patient will demonstrate independence in home exercise program for BUE strengthening to increase independence in IADL tasks.    Baseline  no current HEP    Time   10    Period  Weeks    Status  New    Target Date  08/22/18      OT LONG TERM GOAL #2   Title  Patient will demonstrate managing socks, donning and doffing with modified independence.     Baseline  requires assist with socks at eval    Time  10    Period  Weeks    Status  New    Target Date  07/01/18      OT LONG TERM GOAL #3   Title  Patient will demonstrate ability to carry one lightweight grocery bag from the car to the house.      Baseline  unable at eval     Time  8    Period  Weeks    Status  New    Target Date  08/08/18      OT LONG TERM GOAL #4   Title  Patient will demonstrate 2 trials of 5 mins each of standing for cooking tasks.    Baseline  unable at eval    Time  10    Period  Weeks    Status  New    Target Date  08/22/18      OT LONG TERM GOAL #5   Title  Patient will perform grooming from standing position with modified independence to complete oral care.    Baseline  has to sit to complete at eval    Time  8    Period  Weeks    Status  New    Target Date  08/08/18            Plan - 06/13/18 1437    Clinical Impression Statement  Pt is a 47 yo female who presents with extensive medical history/surgical history with extensive medication list who presents with generalized muscle weakness, decreased endurance, decreased ability to perform self care and IADL tasks.  She reports being in bed for up to 22 hours  a day with potentially out of bed for 1.5 to 2 hours in short increments.  She is able to complete her basic self care tasks (except lower body dressing for socks) with extensive modifications and from a seated position.  She is unable  to complete IADL tasks such as cooking, cleaning, shopping, managing groceries, laundry or yard work.  She would benefit from skilled OT to maximize safety and independence in necessary daily tasks.  She does have to drive a long distance for therapy from [place] and has concerns about managing co pays.       Occupational Profile and client history currently impacting functional performance  co morbidities, multiple surgeries, hx of carpal tunnel with surgery, hx of trigger fingers, arthritis, R shoulder impairments with past surgical intervention.      Occupational performance deficits (Please refer to evaluation for details):  ADL's;IADL's;Social Participation    Rehab Potential  Fair    Current Impairments/barriers affecting progress:  limited mobility, limited endurance to activity, potential surgery soon for excess skin, lives a distance from the clinic, concerned about co pay.      OT Frequency  1x / week    OT Duration  Other (comment)   10   OT Treatment/Interventions  Self-care/ADL training;Therapeutic exercise;Moist Heat;Neuromuscular education;Patient/family education;Energy conservation;Therapist, nutritional;Therapeutic activities;Balance training;DME and/or AE instruction;Manual Therapy    Clinical Decision Making  Several treatment options, min-mod task modification necessary    Consulted and Agree with Plan of Care  Patient       Patient will benefit from skilled therapeutic intervention in order to improve the following deficits and impairments:  Abnormal gait, Decreased knowledge of use of DME, Impaired flexibility, Pain, Decreased coordination, Decreased mobility, Decreased activity tolerance, Improper body mechanics, Decreased endurance, Decreased strength, Decreased balance, Difficulty walking, Impaired UE functional use, Obesity  Visit Diagnosis: Muscle weakness (generalized)  Other lack of coordination    Problem List Patient Active Problem List   Diagnosis Date Noted  . Allergy to iodine 04/26/2018  . H/O allergy to shellfish 04/26/2018  . Encounter for dental examination 02/21/2018  . Spondylosis without myelopathy or radiculopathy, lumbosacral region 02/21/2018  . History of gastric bypass 02/15/2018  . Depression 01/19/2018  . Secondary Osteoarthritis of  knee (Bilateral) (R>L) 01/02/2018  . Encounter for dental exam and cleaning w/o abnormal findings 12/19/2017  . Post-operative hypothyroidism 12/05/2017  . Osteoarthritis of hip (Right) 07/07/2017  . Osteoarthritis of shoulder (Left) 05/31/2017  . Upper extremity pain 04/21/2017  . Chronic shoulder pain (Left) 04/21/2017  . Chronic pain syndrome 12/02/2016  . Neurogenic pain 12/02/2016  . Chest pain with low risk of acute coronary syndrome 08/04/2016  . SOB (shortness of breath) on exertion 08/04/2016  . Dysphagia, unspecified 05/04/2016  . Gastroesophageal reflux disease without esophagitis 05/04/2016  . Vitamin D insufficiency 11/27/2015  . Nontoxic goiter 11/12/2015  . Other psychoactive substance dependence, uncomplicated ([place]) 55/73/2202  . Controlled drug dependence ([place]) 10/13/2015  . Lumbar spondylosis 09/18/2015  . Hypomagnesemia 08/27/2015  . Morbid obesity with body mass index (BMI) greater than or equal to 70 in adult [facility]) 08/27/2015  . Long term current use of opiate analgesic 07/29/2015  . Long term prescription opiate use 07/29/2015  . Opiate use 07/29/2015  . Encounter for therapeutic drug level monitoring 07/29/2015  . Opiate dependence ([place]) 07/29/2015  . Chronic knee pain (Primary Source of Pain) (Bilateral) (R>L) 07/29/2015  . Chronic low back pain [facility] source of pain) (Bilateral) (R>L) 07/29/2015  . Lumbar facet syndrome (Bilateral) (R>L) 07/29/2015  . Osteoarthritis 07/29/2015  . Grade 1 (1.4 cm) Anterolisthesis of L4 over L5 07/29/2015  . Chronic hip pain (Secondary source of pain) (Right) 07/29/2015  . S/P THR (total hip replacement) (Left) 07/29/2015  . History of methicillin  resistant staphylococcus aureus (MRSA) 07/29/2015  . History of bariatric surgery 07/29/2015  . History of methicillin resistant Staphylococcus aureus infection 07/29/2015  . Eczema 05/21/2015  . Gout 05/21/2015  . Headache, migraine 05/21/2015  . Bilateral polycystic ovarian  syndrome 05/21/2015  . Disease of thyroid gland 05/21/2015  . Major depressive disorder, single episode 05/21/2015  . Dermatitis 05/21/2015  . Obstructive apnea 04/30/2015  . History of asthma 03/10/2015  . Binge eating disorder 03/10/2015  . Fibromyalgia 03/10/2015  . Anxiety, generalized 03/10/2015  . Insomnia, persistent 03/10/2015  . Depression, major, recurrent, moderate ([place]) 03/10/2015  . Avitaminosis D 04/30/2014  . Hyperlipidemia 04/25/2014  . Hyperlipidemia due to type 2 diabetes mellitus ([place]) 04/25/2014  . Uncomplicated asthma 16/06/9603  . Asthma 03/19/2014  . Goiter, nontoxic, multinodular 10/09/2013  . Apnea, sleep 12/05/2012  . History of surgical procedure 12/05/2012  . Type 2 diabetes mellitus without complication, without long-term current use of insulin ([place]) 12/05/2012  . Hypertension associated with diabetes ([place]) 12/05/2012   Amy T Lovett, OTR/L, CLT  Lovett,Amy 06/13/2018, 2:53 PM  [place] MAIN [facility] SERVICES [address] [place], [place], [address] Phone: [phone]   Fax:  [phone]  Name: Karen Lewis MRN: [id number] Date of Birth: May 13, 1971

## 2018-06-13 NOTE — Addendum Note (Signed)
Addended by: Garlon Hatchet T on: 06/13/2018 02:57 PM   Modules accepted: Orders

## 2018-06-15 ENCOUNTER — Ambulatory Visit: Payer: Medicare HMO | Admitting: Occupational Therapy

## 2018-06-15 ENCOUNTER — Ambulatory Visit: Payer: Medicare HMO

## 2018-06-20 ENCOUNTER — Ambulatory Visit: Payer: Medicare HMO | Attending: Internal Medicine | Admitting: Occupational Therapy

## 2018-06-20 ENCOUNTER — Ambulatory Visit: Payer: Medicare HMO

## 2018-06-20 ENCOUNTER — Encounter: Payer: Self-pay | Admitting: Occupational Therapy

## 2018-06-20 DIAGNOSIS — R278 Other lack of coordination: Secondary | ICD-10-CM

## 2018-06-20 DIAGNOSIS — R2689 Other abnormalities of gait and mobility: Secondary | ICD-10-CM | POA: Diagnosis not present

## 2018-06-20 DIAGNOSIS — R262 Difficulty in walking, not elsewhere classified: Secondary | ICD-10-CM | POA: Insufficient documentation

## 2018-06-20 DIAGNOSIS — M6281 Muscle weakness (generalized): Secondary | ICD-10-CM

## 2018-06-20 NOTE — Therapy (Addendum)
[place] MAIN [facility] SERVICES [address] [place], [place], [address] Phone: [phone]   Fax:  [phone]  Occupational Therapy Treatment  Patient Details  Name: Karen Lewis MRN: [id number] Date of Birth: 1971-05-03 No data recorded  Encounter Date: 06/20/2018  OT End of Session - 06/20/18 1446    Visit Number  2    Number of Visits  10    Date for OT Re-Evaluation  08/22/18    Authorization Type  Medicare visit 2/10    OT Start Time  1330    OT Stop Time  1415    OT Time Calculation (min)  45 min    Activity Tolerance  Patient tolerated treatment well    Behavior During Therapy  [facility] for tasks assessed/performed       Past Medical History:  Diagnosis Date  . Anemia   . Anginal pain ([place])   . Anxiety   . Arthralgia of hip 07/29/2015  . Arthritis   . Arthritis, degenerative 07/29/2015  . Asthma   . Cephalalgia 07/25/2014  . Dependence on unknown drug ([place])    multiplt controlled drug dependence  . Depression   . Diabetes mellitus without complication ([place])   . Dysrhythmia   . Eczema   . Fibromyalgia   . Gastritis   . GERD (gastroesophageal reflux disease)   . Gonalgia 07/29/2015   Overview:  Overview:  The patient has had bilateral intra-articular Hyalgan injections done on 07/16/2014 and although she seems to do well with this type of therapy, apparently her insurance company does not want to pay for they Hyalgan. On 11/27/2014 the patient underwent a bilateral genicular nerve block with excellent results. On 01/28/2015 she had a right knee genicular radiofrequency ablatio  . Gout   . H/O cardiovascular disorder 03/10/2015  . H/O surgical procedure 12/05/2012   Overview:  LSG ([facility] - April 2013)    . H/O thyroid disease 03/10/2015  . Headache   . Herpes   . History of artificial joint 07/29/2015  . History of hiatal hernia   . Hypertension   . Hypomagnesemia   . Hypothyroidism   . LBP (low back pain) 07/29/2015  .  Neuromuscular disorder ([place])   . Obesity   . PCOS (polycystic ovarian syndrome)   . Primary osteoarthritis of both knees 07/29/2015  . Sleep apnea   . Thyroid nodule bilateral    Past Surgical History:  Procedure Laterality Date  . CARPAL TUNNEL RELEASE Bilateral   . CHOLECYSTECTOMY    . DIAGNOSTIC LAPAROSCOPY    . HIATAL HERNIA REPAIR    . JOINT REPLACEMENT Left hip  . LAPAROSCOPIC PARTIAL GASTRECTOMY    . left trigger finger    . ROUX-EN-Y GASTRIC BYPASS  06/03/2017  . SHOULDER ARTHROSCOPY Right   . THYROIDECTOMY N/A 11/12/2015   Procedure: THYROIDECTOMY;  Surgeon: Clyde Canterbury, MD;  Location: [facility] ORS;  Service: ENT;  Laterality: N/A;  . TRIGGER FINGER RELEASE Right     There were no vitals filed for this visit.  Subjective Assessment - 06/20/18 1351    Subjective   Pt reports feeling a little sore from aquatic exercises last week    Pertinent History  Pt is a 47 y/o F who presents with generalized muscle weakness and deconditioning.  Pt with chronic pain in knees/hips/shoulders/back with some success from therapy in the past.  This pain limits her mobility as well as participation in daily tasks.  Pt reports she uses her rollator  in kitchen if she is doing laundry, cooking, washing dishes and sits to perform all activities.  She is able to ambulate household distances without AD.  Has a scooter for the community. Patients personal Goals: walk down aisle at her wedding in May, to stand and cook dinner, to be able to walk longer distances, to be able to plant flowers in her yard, carry small bags of groceries into the house, complete laundry.  Pt reports h/o depression, on medication, reports she will be finding new counselor once her bariatric surgery is complete.  Pt with h/o gastric sleeve surgery, fibromyalgia, anterolisthesis of L4 over L5, L THA, OA of R hip.  She has mentioned she may have additional surgery in the near future for removal of excess skin.      Patient Stated Goals   To be able to stand and cook dinner, to be able to plant flowers in her yard, carry small bags of groceries into the house, complete laundry.    Currently in Pain?  Yes      OT TREATMENT:  Therapeutic exercise:  Pt completed UE strengthening exercises including shoulder press, chest press, and large shoulder rotations for 2 sets of 10 reps each, using a 1.5# weighted dowel. Pt was unable to complete full range for shoulder press without pain. Pt was instructed to stop before the point of pain. Pt demonstrated increased signs of exertion after each set. Pt was instructed through deep breathing and was encouraged to take breaks between sets. Pt completed strengthening exercises including bicep curls, wrist flexion/extension, wrist deviations, and forearm supination/pronation for 2 sets of 10 reps each, while using a 1# dumbbell. Pt was able to complete full range for each exercise. Pt received education for HEP for theraputty exercises. Pt accurately returned demonstration for each exercise and verbalized understanding. Pt. Was provided with a HEP for UE exercises through [place].                     OT Education - 06/20/18 1446    Education Details  UE strengthening, hand strengthening, HEP    Person(s) Educated  Patient;Spouse    Methods  Explanation;Demonstration    Comprehension  Verbalized understanding;Returned demonstration          OT Long Term Goals - 06/13/18 1448      OT LONG TERM GOAL #1   Title  Patient will demonstrate independence in home exercise program for BUE strengthening to increase independence in IADL tasks.    Baseline  no current HEP    Time  10    Period  Weeks    Status  New    Target Date  08/22/18      OT LONG TERM GOAL #2   Title  Patient will demonstrate managing socks, donning and doffing with modified independence.     Baseline  requires assist with socks at eval    Time  10    Period  Weeks    Status  New    Target Date   07/01/18      OT LONG TERM GOAL #3   Title  Patient will demonstrate ability to carry one lightweight grocery bag from the car to the house.      Baseline  unable at eval     Time  8    Period  Weeks    Status  New    Target Date  08/08/18      OT LONG TERM GOAL #4  Title  Patient will demonstrate 2 trials of 5 mins each of standing for cooking tasks.    Baseline  unable at eval    Time  10    Period  Weeks    Status  New    Target Date  08/22/18      OT LONG TERM GOAL #5   Title  Patient will perform grooming from standing position with modified independence to complete oral care.    Baseline  has to sit to complete at eval    Time  8    Period  Weeks    Status  New    Target Date  08/08/18            Plan - 06/20/18 1447    Clinical Impression Statement  Pt reports spending an increased amount of time in bed each day. Pt presents with UE muscle weakness, hand muscle weakness, and decreased activity tolerance. Pt continues to work on UE and hand strengthening to increase independence during ADLs and IADLs.     Occupational Profile and client history currently impacting functional performance  co morbidities, multiple surgeries, hx of carpal tunnel with surgery, hx of trigger fingers, arthritis, R shoulder impairments with past surgical intervention.      Occupational performance deficits (Please refer to evaluation for details):  ADL's;IADL's;Social Participation    Rehab Potential  Fair    Current Impairments/barriers affecting progress:  limited mobility, limited endurance to activity, potential surgery soon for excess skin, lives a distance from the clinic, concerned about co pay.      OT Frequency  1x / week    OT Duration  Other (comment)    OT Treatment/Interventions  Self-care/ADL training;Therapeutic exercise;Moist Heat;Neuromuscular education;Patient/family education;Energy conservation;Therapist, nutritional;Therapeutic activities;Balance training;DME and/or  AE instruction;Manual Therapy    Clinical Decision Making  Several treatment options, min-mod task modification necessary    Consulted and Agree with Plan of Care  Patient       Patient will benefit from skilled therapeutic intervention in order to improve the following deficits and impairments:  Abnormal gait, Decreased knowledge of use of DME, Impaired flexibility, Pain, Decreased coordination, Decreased mobility, Decreased activity tolerance, Improper body mechanics, Decreased endurance, Decreased strength, Decreased balance, Difficulty walking, Impaired UE functional use, Obesity  Visit Diagnosis: Muscle weakness (generalized)    Problem List Patient Active Problem List   Diagnosis Date Noted  . Allergy to iodine 04/26/2018  . H/O allergy to shellfish 04/26/2018  . Encounter for dental examination 02/21/2018  . Spondylosis without myelopathy or radiculopathy, lumbosacral region 02/21/2018  . History of gastric bypass 02/15/2018  . Depression 01/19/2018  . Secondary Osteoarthritis of knee (Bilateral) (R>L) 01/02/2018  . Encounter for dental exam and cleaning w/o abnormal findings 12/19/2017  . Post-operative hypothyroidism 12/05/2017  . Osteoarthritis of hip (Right) 07/07/2017  . Osteoarthritis of shoulder (Left) 05/31/2017  . Upper extremity pain 04/21/2017  . Chronic shoulder pain (Left) 04/21/2017  . Chronic pain syndrome 12/02/2016  . Neurogenic pain 12/02/2016  . Chest pain with low risk of acute coronary syndrome 08/04/2016  . SOB (shortness of breath) on exertion 08/04/2016  . Dysphagia, unspecified 05/04/2016  . Gastroesophageal reflux disease without esophagitis 05/04/2016  . Vitamin D insufficiency 11/27/2015  . Nontoxic goiter 11/12/2015  . Other psychoactive substance dependence, uncomplicated ([place]) 27/25/3664  . Controlled drug dependence ([place]) 10/13/2015  . Lumbar spondylosis 09/18/2015  . Hypomagnesemia 08/27/2015  . Morbid obesity with body mass index (BMI)  greater than or equal  to 17 in adult [facility]) 08/27/2015  . Long term current use of opiate analgesic 07/29/2015  . Long term prescription opiate use 07/29/2015  . Opiate use 07/29/2015  . Encounter for therapeutic drug level monitoring 07/29/2015  . Opiate dependence ([place]) 07/29/2015  . Chronic knee pain (Primary Source of Pain) (Bilateral) (R>L) 07/29/2015  . Chronic low back pain [facility] source of pain) (Bilateral) (R>L) 07/29/2015  . Lumbar facet syndrome (Bilateral) (R>L) 07/29/2015  . Osteoarthritis 07/29/2015  . Grade 1 (1.4 cm) Anterolisthesis of L4 over L5 07/29/2015  . Chronic hip pain (Secondary source of pain) (Right) 07/29/2015  . S/P THR (total hip replacement) (Left) 07/29/2015  . History of methicillin resistant staphylococcus aureus (MRSA) 07/29/2015  . History of bariatric surgery 07/29/2015  . History of methicillin resistant Staphylococcus aureus infection 07/29/2015  . Eczema 05/21/2015  . Gout 05/21/2015  . Headache, migraine 05/21/2015  . Bilateral polycystic ovarian syndrome 05/21/2015  . Disease of thyroid gland 05/21/2015  . Major depressive disorder, single episode 05/21/2015  . Dermatitis 05/21/2015  . Obstructive apnea 04/30/2015  . History of asthma 03/10/2015  . Binge eating disorder 03/10/2015  . Fibromyalgia 03/10/2015  . Anxiety, generalized 03/10/2015  . Insomnia, persistent 03/10/2015  . Depression, major, recurrent, moderate ([place]) 03/10/2015  . Avitaminosis D 04/30/2014  . Hyperlipidemia 04/25/2014  . Hyperlipidemia due to type 2 diabetes mellitus ([place]) 04/25/2014  . Uncomplicated asthma 89/37/3428  . Asthma 03/19/2014  . Goiter, nontoxic, multinodular 10/09/2013  . Apnea, sleep 12/05/2012  . History of surgical procedure 12/05/2012  . Type 2 diabetes mellitus without complication, without long-term current use of insulin ([place]) 12/05/2012  . Hypertension associated with diabetes ([place]) 12/05/2012    Oliver Hum, OTS  06/20/2018, 2:53 PM    This entire session was performed under direct supervision and direction of a licensed therapist/therapist assistant . I have personally read, edited and approve of the note as written.  Harrel Carina, MS, OTR/L  [place] MAIN [facility] SERVICES [address] [place], [place], [address] Phone: [phone]   Fax:  [phone]  Name: LAVENDER STANKE MRN: [id number] Date of Birth: [birth year]

## 2018-06-20 NOTE — Therapy (Addendum)
[place] MAIN [facility] SERVICES [address] [place], [place], [address] Phone: [phone]   Fax:  [phone]  Physical Therapy Treatment  Patient Details  Name: Karen Lewis MRN: [id number] Date of Birth: 02/02/1971 Referring Provider (PT): Nicolette Bang (MD)   Encounter Date: 06/20/2018  PT End of Session - 06/20/18 1508    Visit Number  2    Number of Visits  11    Date for PT Re-Evaluation  08/21/18    Authorization Type  $40 copay    Authorization Time Period  progress note 2/10; start of reporting period 9/30    PT Start Time  0218    PT Stop Time  0310    PT Time Calculation (min)  52 min    Equipment Utilized During Treatment  Gait belt   two pink gait belts connected together (bariatric gait belt)   Activity Tolerance  Patient limited by pain;Patient limited by fatigue    Behavior During Therapy  [facility] for tasks assessed/performed       Past Medical History:  Diagnosis Date  . Anemia   . Anginal pain ([place])   . Anxiety   . Arthralgia of hip 07/29/2015  . Arthritis   . Arthritis, degenerative 07/29/2015  . Asthma   . Cephalalgia 07/25/2014  . Dependence on unknown drug ([place])    multiplt controlled drug dependence  . Depression   . Diabetes mellitus without complication ([place])   . Dysrhythmia   . Eczema   . Fibromyalgia   . Gastritis   . GERD (gastroesophageal reflux disease)   . Gonalgia 07/29/2015   Overview:  Overview:  The patient has had bilateral intra-articular Hyalgan injections done on 07/16/2014 and although she seems to do well with this type of therapy, apparently her insurance company does not want to pay for they Hyalgan. On 11/27/2014 the patient underwent a bilateral genicular nerve block with excellent results. On 01/28/2015 she had a right knee genicular radiofrequency ablatio  . Gout   . H/O cardiovascular disorder 03/10/2015  . H/O surgical procedure 12/05/2012   Overview:  LSG ([facility] - April 2013)     . H/O thyroid disease 03/10/2015  . Headache   . Herpes   . History of artificial joint 07/29/2015  . History of hiatal hernia   . Hypertension   . Hypomagnesemia   . Hypothyroidism   . LBP (low back pain) 07/29/2015  . Neuromuscular disorder ([place])   . Obesity   . PCOS (polycystic ovarian syndrome)   . Primary osteoarthritis of both knees 07/29/2015  . Sleep apnea   . Thyroid nodule bilateral    Past Surgical History:  Procedure Laterality Date  . CARPAL TUNNEL RELEASE Bilateral   . CHOLECYSTECTOMY    . DIAGNOSTIC LAPAROSCOPY    . HIATAL HERNIA REPAIR    . JOINT REPLACEMENT Left hip  . LAPAROSCOPIC PARTIAL GASTRECTOMY    . left trigger finger    . ROUX-EN-Y GASTRIC BYPASS  06/03/2017  . SHOULDER ARTHROSCOPY Right   . THYROIDECTOMY N/A 11/12/2015   Procedure: THYROIDECTOMY;  Surgeon: Clyde Canterbury, MD;  Location: [facility] ORS;  Service: ENT;  Laterality: N/A;  . TRIGGER FINGER RELEASE Right     There were no vitals filed for this visit.  Subjective Assessment - 06/20/18 1506    Subjective  Patient reports she is having a good day. States she does not have any pain and only soreness currently. States she was really sore after  last session and has not completed her HEP. Reports she should hear back from doctor regarding surgery in next couple days.    Patient is accompained by:  --   Fiance   Pertinent History  Pt is a 47 y/o F who presents with generalized weakness and deconditioning.  Pt with chronic pain in knees/hips/shoulders/back with some success from therapy in the past.  This pain limits her mobility.  Pt reports she uses her rollator in kitchen if she is doing laundry, cooking, washing dishes.  She is able to ambulate household distances without AD.  Has a scooter for the community. Goals: walk down aisle at her wedding in May, to stand and cook dinner, to be able to walk longer distances, to be able to plant flowers in her yard.  Pt reports h/o depression, on medication,  reports she will be finding new counselor once her bariatric surgery is complete.  Pt with h/o gastric sleeve surgery, fibromyalgia, anterolisthesis of L4 over L5, L THA, OA of R hip.    Limitations  Standing;Walking;House hold activities    How long can you stand comfortably?  5 minutes    How long can you walk comfortably?  10 meters    Patient Stated Goals  see above    Currently in Pain?  No/denies    Pain Onset  More than a month ago    Pain Onset  More than a month ago       Reviewed HEP  Marching in standing x10 each LE with BUE support    Standing hip abduction x10 each leg. BUE  Seated LAQ x10 each LE. Verbal cues to decrease speed of movement.     Forward and backward walking x5 length of // bars. BUE and CGA 1set;  x6 lengths 2nd set  Seated adduction isometric squeeze x10 3 second holds. Cued to maintain contraction.   Airex pad: standing 30 seconds without UE support CGA; terminated due to onset of low back pain.   Nustep level 0 2 minutes >60rpm for cardiovascular challenge and improve activity tolerance    Patient educated and provided handout on resources available for therapist regarding mental health  Heat pad was used on low back and ice pack on R knee at end of session for pain relief 10 minutes (unbilled)  Patient uses extra large gait belt (2 pink belts tied together)                        PT Education - 06/20/18 1507    Education Details  HEP, exercise technique    Person(s) Educated  Patient    Methods  Explanation;Demonstration;Verbal cues    Comprehension  Verbalized understanding;Returned demonstration;Need further instruction       PT Short Term Goals - 06/12/18 1528      PT SHORT TERM GOAL #1   Title  Pt will complete HEP at least 4 days/wk for improved endurance, strength, and independence    Time  2    Period  Weeks    Status  New        PT Long Term Goals - 06/12/18 1528      PT LONG TERM GOAL #1   Title   Pt's 5xSTS time will improve to equal or less than 13 seconds without UE assist    Baseline  15.28 sec with light BUE assist    Time  6    Period  Weeks    Status  New      PT LONG TERM GOAL #2   Title  Pt will be able to stand long enough to cook simple meals and to wash dishes without rest breaks     Baseline  Unable    Time  8    Period  Weeks    Status  New      PT LONG TERM GOAL #3   Title  Pt's 21mWT will improve to equal to or greater than 1.0 m/s for improved safety ambulating in home and community    Baseline  0.53 m/s    Time  10    Period  Weeks    Status  New      PT LONG TERM GOAL #4   Title  Pt's Berg Balance Test will improve to equal to or greater than 46/56 to demonstrate improved balance    Baseline  34/56    Time  10    Period  Weeks    Status  New      PT LONG TERM GOAL #5   Title  Pt will be able to ambulate at least 150 ft without AD without rest breaks for improved ambulatory endurance and to be able to walk down the aisle at her wedding in May 2020    Baseline  10 meters    Time  8    Period  Weeks    Status  New            Plan - 06/20/18 1511    Clinical Impression Statement  Reviewed patient HEP for proper mechanics and technique. Patient ambulated for 2 minutes in //bars before requiring seated rest breaks. Educated patient about walking for this duration at home and to slowly increase ambulation time and activity tolerance. Patient requires frequent rest breaks secondary to poor functional capacity and pain. Patient will continue to benefit from skilled physical therapy to improve strength, endurance, and independence with functional activities.     Rehab Potential  Fair    PT Frequency  1x / week    PT Duration  Other (comment)   10 weeks   PT Treatment/Interventions  ADLs/Self Care Home Management;Aquatic Therapy;Cryotherapy;Electrical Stimulation;Iontophoresis 4mg /ml Dexamethasone;Moist Heat;Ultrasound;DME Instruction;Gait training;Stair  training;Functional mobility training;Therapeutic exercise;Therapeutic activities;Balance training;Neuromuscular re-education;Patient/family education;Manual techniques;Passive range of motion;Dry needling;Energy conservation;Splinting;Taping    PT Next Visit Plan  Assess response to HEP; provide pt with information from SW regarding resources in her area for depression; advance HEP as appropriate    PT Home Exercise Plan  LAQ, forward and backward walking, standing hip abduction, marching in standing    Consulted and Agree with Plan of Care  Patient       Patient will benefit from skilled therapeutic intervention in order to improve the following deficits and impairments:  Abnormal gait, Decreased activity tolerance, Decreased balance, Decreased endurance, Decreased knowledge of use of DME, Decreased mobility, Decreased range of motion, Decreased safety awareness, Decreased strength, Difficulty walking, Hypomobility, Increased fascial restricitons, Increased muscle spasms, Impaired perceived functional ability, Impaired flexibility, Impaired sensation, Impaired UE functional use, Improper body mechanics, Postural dysfunction, Pain, Obesity  Visit Diagnosis: Muscle weakness (generalized)  Other abnormalities of gait and mobility  Difficulty in walking, not elsewhere classified  Other lack of coordination     Problem List Patient Active Problem List   Diagnosis Date Noted  . Allergy to iodine 04/26/2018  . H/O allergy to shellfish 04/26/2018  . Encounter for dental examination 02/21/2018  . Spondylosis without myelopathy or radiculopathy,  lumbosacral region 02/21/2018  . History of gastric bypass 02/15/2018  . Depression 01/19/2018  . Secondary Osteoarthritis of knee (Bilateral) (R>L) 01/02/2018  . Encounter for dental exam and cleaning w/o abnormal findings 12/19/2017  . Post-operative hypothyroidism 12/05/2017  . Osteoarthritis of hip (Right) 07/07/2017  . Osteoarthritis of  shoulder (Left) 05/31/2017  . Upper extremity pain 04/21/2017  . Chronic shoulder pain (Left) 04/21/2017  . Chronic pain syndrome 12/02/2016  . Neurogenic pain 12/02/2016  . Chest pain with low risk of acute coronary syndrome 08/04/2016  . SOB (shortness of breath) on exertion 08/04/2016  . Dysphagia, unspecified 05/04/2016  . Gastroesophageal reflux disease without esophagitis 05/04/2016  . Vitamin D insufficiency 11/27/2015  . Nontoxic goiter 11/12/2015  . Other psychoactive substance dependence, uncomplicated ([place]) 88/91/6945  . Controlled drug dependence ([place]) 10/13/2015  . Lumbar spondylosis 09/18/2015  . Hypomagnesemia 08/27/2015  . Morbid obesity with body mass index (BMI) greater than or equal to 70 in adult [facility]) 08/27/2015  . Long term current use of opiate analgesic 07/29/2015  . Long term prescription opiate use 07/29/2015  . Opiate use 07/29/2015  . Encounter for therapeutic drug level monitoring 07/29/2015  . Opiate dependence ([place]) 07/29/2015  . Chronic knee pain (Primary Source of Pain) (Bilateral) (R>L) 07/29/2015  . Chronic low back pain [facility] source of pain) (Bilateral) (R>L) 07/29/2015  . Lumbar facet syndrome (Bilateral) (R>L) 07/29/2015  . Osteoarthritis 07/29/2015  . Grade 1 (1.4 cm) Anterolisthesis of L4 over L5 07/29/2015  . Chronic hip pain (Secondary source of pain) (Right) 07/29/2015  . S/P THR (total hip replacement) (Left) 07/29/2015  . History of methicillin resistant staphylococcus aureus (MRSA) 07/29/2015  . History of bariatric surgery 07/29/2015  . History of methicillin resistant Staphylococcus aureus infection 07/29/2015  . Eczema 05/21/2015  . Gout 05/21/2015  . Headache, migraine 05/21/2015  . Bilateral polycystic ovarian syndrome 05/21/2015  . Disease of thyroid gland 05/21/2015  . Major depressive disorder, single episode 05/21/2015  . Dermatitis 05/21/2015  . Obstructive apnea 04/30/2015  . History of asthma 03/10/2015  . Binge  eating disorder 03/10/2015  . Fibromyalgia 03/10/2015  . Anxiety, generalized 03/10/2015  . Insomnia, persistent 03/10/2015  . Depression, major, recurrent, moderate ([place]) 03/10/2015  . Avitaminosis D 04/30/2014  . Hyperlipidemia 04/25/2014  . Hyperlipidemia due to type 2 diabetes mellitus ([place]) 04/25/2014  . Uncomplicated asthma 03/88/8280  . Asthma 03/19/2014  . Goiter, nontoxic, multinodular 10/09/2013  . Apnea, sleep 12/05/2012  . History of surgical procedure 12/05/2012  . Type 2 diabetes mellitus without complication, without long-term current use of insulin ([place]) 12/05/2012  . Hypertension associated with diabetes ([place]) 12/05/2012   Erick Blinks, SPT  This entire session was performed under direct supervision and direction of a licensed therapist/therapist assistant . I have personally read, edited and approve of the note as written.  Janna Arch, PT, DPT   06/21/2018, 8:08 AM  [place] MAIN [facility] SERVICES [address] [place], [place], [address] Phone: [phone]   Fax:  [phone]  Name: Karen Lewis MRN: [id number] Date of Birth: 1970/12/25

## 2018-06-22 ENCOUNTER — Encounter: Payer: Medicare HMO | Admitting: Occupational Therapy

## 2018-06-26 DIAGNOSIS — Z01818 Encounter for other preprocedural examination: Secondary | ICD-10-CM | POA: Diagnosis not present

## 2018-06-26 DIAGNOSIS — M793 Panniculitis, unspecified: Secondary | ICD-10-CM | POA: Diagnosis not present

## 2018-06-26 DIAGNOSIS — J45909 Unspecified asthma, uncomplicated: Secondary | ICD-10-CM | POA: Diagnosis not present

## 2018-06-28 DIAGNOSIS — R079 Chest pain, unspecified: Secondary | ICD-10-CM | POA: Diagnosis not present

## 2018-06-28 DIAGNOSIS — E1169 Type 2 diabetes mellitus with other specified complication: Secondary | ICD-10-CM | POA: Diagnosis not present

## 2018-06-28 DIAGNOSIS — E785 Hyperlipidemia, unspecified: Secondary | ICD-10-CM | POA: Diagnosis not present

## 2018-06-28 DIAGNOSIS — I10 Essential (primary) hypertension: Secondary | ICD-10-CM | POA: Diagnosis not present

## 2018-06-28 DIAGNOSIS — E119 Type 2 diabetes mellitus without complications: Secondary | ICD-10-CM | POA: Diagnosis not present

## 2018-06-28 DIAGNOSIS — E1159 Type 2 diabetes mellitus with other circulatory complications: Secondary | ICD-10-CM | POA: Diagnosis not present

## 2018-06-28 DIAGNOSIS — R9431 Abnormal electrocardiogram [ECG] [EKG]: Secondary | ICD-10-CM | POA: Diagnosis not present

## 2018-06-28 DIAGNOSIS — Z01810 Encounter for preprocedural cardiovascular examination: Secondary | ICD-10-CM | POA: Diagnosis not present

## 2018-06-28 DIAGNOSIS — R0602 Shortness of breath: Secondary | ICD-10-CM | POA: Diagnosis not present

## 2018-06-29 ENCOUNTER — Encounter: Payer: Medicare HMO | Admitting: Occupational Therapy

## 2018-07-04 ENCOUNTER — Encounter: Payer: Medicare HMO | Admitting: Occupational Therapy

## 2018-07-05 DIAGNOSIS — I10 Essential (primary) hypertension: Secondary | ICD-10-CM | POA: Diagnosis not present

## 2018-07-05 DIAGNOSIS — Z6841 Body Mass Index (BMI) 40.0 and over, adult: Secondary | ICD-10-CM | POA: Diagnosis not present

## 2018-07-05 DIAGNOSIS — E119 Type 2 diabetes mellitus without complications: Secondary | ICD-10-CM | POA: Diagnosis not present

## 2018-07-05 DIAGNOSIS — F419 Anxiety disorder, unspecified: Secondary | ICD-10-CM | POA: Diagnosis not present

## 2018-07-05 DIAGNOSIS — G4730 Sleep apnea, unspecified: Secondary | ICD-10-CM | POA: Diagnosis not present

## 2018-07-05 DIAGNOSIS — F329 Major depressive disorder, single episode, unspecified: Secondary | ICD-10-CM | POA: Diagnosis not present

## 2018-07-05 DIAGNOSIS — G43909 Migraine, unspecified, not intractable, without status migrainosus: Secondary | ICD-10-CM | POA: Diagnosis not present

## 2018-07-05 DIAGNOSIS — K219 Gastro-esophageal reflux disease without esophagitis: Secondary | ICD-10-CM | POA: Diagnosis not present

## 2018-07-05 DIAGNOSIS — I96 Gangrene, not elsewhere classified: Secondary | ICD-10-CM | POA: Diagnosis not present

## 2018-07-05 DIAGNOSIS — E785 Hyperlipidemia, unspecified: Secondary | ICD-10-CM | POA: Diagnosis not present

## 2018-07-05 DIAGNOSIS — E890 Postprocedural hypothyroidism: Secondary | ICD-10-CM | POA: Diagnosis not present

## 2018-07-05 DIAGNOSIS — Z9884 Bariatric surgery status: Secondary | ICD-10-CM | POA: Diagnosis not present

## 2018-07-05 DIAGNOSIS — M150 Primary generalized (osteo)arthritis: Secondary | ICD-10-CM | POA: Diagnosis not present

## 2018-07-05 DIAGNOSIS — M793 Panniculitis, unspecified: Secondary | ICD-10-CM | POA: Insufficient documentation

## 2018-07-05 DIAGNOSIS — E039 Hypothyroidism, unspecified: Secondary | ICD-10-CM | POA: Diagnosis not present

## 2018-07-05 DIAGNOSIS — J45909 Unspecified asthma, uncomplicated: Secondary | ICD-10-CM | POA: Diagnosis not present

## 2018-07-05 HISTORY — PX: OTHER SURGICAL HISTORY: SHX169

## 2018-07-06 ENCOUNTER — Encounter: Payer: Medicare HMO | Admitting: Occupational Therapy

## 2018-07-11 ENCOUNTER — Encounter: Payer: Medicare HMO | Admitting: Occupational Therapy

## 2018-07-11 DIAGNOSIS — J45909 Unspecified asthma, uncomplicated: Secondary | ICD-10-CM | POA: Diagnosis not present

## 2018-07-11 DIAGNOSIS — Z48817 Encounter for surgical aftercare following surgery on the skin and subcutaneous tissue: Secondary | ICD-10-CM | POA: Diagnosis not present

## 2018-07-11 DIAGNOSIS — M793 Panniculitis, unspecified: Secondary | ICD-10-CM | POA: Diagnosis not present

## 2018-07-11 DIAGNOSIS — E1142 Type 2 diabetes mellitus with diabetic polyneuropathy: Secondary | ICD-10-CM | POA: Diagnosis not present

## 2018-07-11 DIAGNOSIS — I10 Essential (primary) hypertension: Secondary | ICD-10-CM | POA: Diagnosis not present

## 2018-07-11 DIAGNOSIS — Z4682 Encounter for fitting and adjustment of non-vascular catheter: Secondary | ICD-10-CM | POA: Diagnosis not present

## 2018-07-11 DIAGNOSIS — M797 Fibromyalgia: Secondary | ICD-10-CM | POA: Diagnosis not present

## 2018-07-11 DIAGNOSIS — M150 Primary generalized (osteo)arthritis: Secondary | ICD-10-CM | POA: Diagnosis not present

## 2018-07-11 DIAGNOSIS — G4730 Sleep apnea, unspecified: Secondary | ICD-10-CM | POA: Diagnosis not present

## 2018-07-12 DIAGNOSIS — M797 Fibromyalgia: Secondary | ICD-10-CM | POA: Diagnosis not present

## 2018-07-12 DIAGNOSIS — M6281 Muscle weakness (generalized): Secondary | ICD-10-CM | POA: Diagnosis not present

## 2018-07-12 DIAGNOSIS — Z48817 Encounter for surgical aftercare following surgery on the skin and subcutaneous tissue: Secondary | ICD-10-CM | POA: Diagnosis not present

## 2018-07-12 DIAGNOSIS — J45909 Unspecified asthma, uncomplicated: Secondary | ICD-10-CM | POA: Diagnosis not present

## 2018-07-12 DIAGNOSIS — Z4682 Encounter for fitting and adjustment of non-vascular catheter: Secondary | ICD-10-CM | POA: Diagnosis not present

## 2018-07-12 DIAGNOSIS — G4730 Sleep apnea, unspecified: Secondary | ICD-10-CM | POA: Diagnosis not present

## 2018-07-12 DIAGNOSIS — M793 Panniculitis, unspecified: Secondary | ICD-10-CM | POA: Diagnosis not present

## 2018-07-12 DIAGNOSIS — R262 Difficulty in walking, not elsewhere classified: Secondary | ICD-10-CM | POA: Diagnosis not present

## 2018-07-12 DIAGNOSIS — I10 Essential (primary) hypertension: Secondary | ICD-10-CM | POA: Diagnosis not present

## 2018-07-12 DIAGNOSIS — E1142 Type 2 diabetes mellitus with diabetic polyneuropathy: Secondary | ICD-10-CM | POA: Diagnosis not present

## 2018-07-12 DIAGNOSIS — M150 Primary generalized (osteo)arthritis: Secondary | ICD-10-CM | POA: Diagnosis not present

## 2018-07-14 DIAGNOSIS — M797 Fibromyalgia: Secondary | ICD-10-CM | POA: Diagnosis not present

## 2018-07-14 DIAGNOSIS — I10 Essential (primary) hypertension: Secondary | ICD-10-CM | POA: Diagnosis not present

## 2018-07-14 DIAGNOSIS — Z4682 Encounter for fitting and adjustment of non-vascular catheter: Secondary | ICD-10-CM | POA: Diagnosis not present

## 2018-07-14 DIAGNOSIS — J45909 Unspecified asthma, uncomplicated: Secondary | ICD-10-CM | POA: Diagnosis not present

## 2018-07-14 DIAGNOSIS — G4730 Sleep apnea, unspecified: Secondary | ICD-10-CM | POA: Diagnosis not present

## 2018-07-14 DIAGNOSIS — M793 Panniculitis, unspecified: Secondary | ICD-10-CM | POA: Diagnosis not present

## 2018-07-14 DIAGNOSIS — Z48817 Encounter for surgical aftercare following surgery on the skin and subcutaneous tissue: Secondary | ICD-10-CM | POA: Diagnosis not present

## 2018-07-14 DIAGNOSIS — M150 Primary generalized (osteo)arthritis: Secondary | ICD-10-CM | POA: Diagnosis not present

## 2018-07-14 DIAGNOSIS — E1142 Type 2 diabetes mellitus with diabetic polyneuropathy: Secondary | ICD-10-CM | POA: Diagnosis not present

## 2018-07-15 DIAGNOSIS — G4730 Sleep apnea, unspecified: Secondary | ICD-10-CM | POA: Diagnosis not present

## 2018-07-15 DIAGNOSIS — M793 Panniculitis, unspecified: Secondary | ICD-10-CM | POA: Diagnosis not present

## 2018-07-15 DIAGNOSIS — Z4682 Encounter for fitting and adjustment of non-vascular catheter: Secondary | ICD-10-CM | POA: Diagnosis not present

## 2018-07-15 DIAGNOSIS — Z48817 Encounter for surgical aftercare following surgery on the skin and subcutaneous tissue: Secondary | ICD-10-CM | POA: Diagnosis not present

## 2018-07-15 DIAGNOSIS — M797 Fibromyalgia: Secondary | ICD-10-CM | POA: Diagnosis not present

## 2018-07-15 DIAGNOSIS — E1142 Type 2 diabetes mellitus with diabetic polyneuropathy: Secondary | ICD-10-CM | POA: Diagnosis not present

## 2018-07-15 DIAGNOSIS — M150 Primary generalized (osteo)arthritis: Secondary | ICD-10-CM | POA: Diagnosis not present

## 2018-07-15 DIAGNOSIS — J45909 Unspecified asthma, uncomplicated: Secondary | ICD-10-CM | POA: Diagnosis not present

## 2018-07-15 DIAGNOSIS — I10 Essential (primary) hypertension: Secondary | ICD-10-CM | POA: Diagnosis not present

## 2018-07-17 ENCOUNTER — Ambulatory Visit: Payer: Medicare HMO | Admitting: Nurse Practitioner

## 2018-07-17 DIAGNOSIS — E1142 Type 2 diabetes mellitus with diabetic polyneuropathy: Secondary | ICD-10-CM | POA: Diagnosis not present

## 2018-07-17 DIAGNOSIS — Z48817 Encounter for surgical aftercare following surgery on the skin and subcutaneous tissue: Secondary | ICD-10-CM | POA: Diagnosis not present

## 2018-07-17 DIAGNOSIS — J45909 Unspecified asthma, uncomplicated: Secondary | ICD-10-CM | POA: Diagnosis not present

## 2018-07-17 DIAGNOSIS — I10 Essential (primary) hypertension: Secondary | ICD-10-CM | POA: Diagnosis not present

## 2018-07-17 DIAGNOSIS — Z4682 Encounter for fitting and adjustment of non-vascular catheter: Secondary | ICD-10-CM | POA: Diagnosis not present

## 2018-07-17 DIAGNOSIS — M150 Primary generalized (osteo)arthritis: Secondary | ICD-10-CM | POA: Diagnosis not present

## 2018-07-17 DIAGNOSIS — M793 Panniculitis, unspecified: Secondary | ICD-10-CM | POA: Diagnosis not present

## 2018-07-17 DIAGNOSIS — M797 Fibromyalgia: Secondary | ICD-10-CM | POA: Diagnosis not present

## 2018-07-17 DIAGNOSIS — G4730 Sleep apnea, unspecified: Secondary | ICD-10-CM | POA: Diagnosis not present

## 2018-07-18 ENCOUNTER — Ambulatory Visit: Payer: Medicare HMO

## 2018-07-19 DIAGNOSIS — G4730 Sleep apnea, unspecified: Secondary | ICD-10-CM | POA: Diagnosis not present

## 2018-07-19 DIAGNOSIS — Z48817 Encounter for surgical aftercare following surgery on the skin and subcutaneous tissue: Secondary | ICD-10-CM | POA: Diagnosis not present

## 2018-07-19 DIAGNOSIS — Z4682 Encounter for fitting and adjustment of non-vascular catheter: Secondary | ICD-10-CM | POA: Diagnosis not present

## 2018-07-19 DIAGNOSIS — J45909 Unspecified asthma, uncomplicated: Secondary | ICD-10-CM | POA: Diagnosis not present

## 2018-07-19 DIAGNOSIS — M793 Panniculitis, unspecified: Secondary | ICD-10-CM | POA: Diagnosis not present

## 2018-07-19 DIAGNOSIS — M150 Primary generalized (osteo)arthritis: Secondary | ICD-10-CM | POA: Diagnosis not present

## 2018-07-19 DIAGNOSIS — I10 Essential (primary) hypertension: Secondary | ICD-10-CM | POA: Diagnosis not present

## 2018-07-19 DIAGNOSIS — M797 Fibromyalgia: Secondary | ICD-10-CM | POA: Diagnosis not present

## 2018-07-19 DIAGNOSIS — E1142 Type 2 diabetes mellitus with diabetic polyneuropathy: Secondary | ICD-10-CM | POA: Diagnosis not present

## 2018-07-25 ENCOUNTER — Ambulatory Visit: Payer: Medicare HMO

## 2018-07-25 DIAGNOSIS — I10 Essential (primary) hypertension: Secondary | ICD-10-CM | POA: Diagnosis not present

## 2018-07-25 DIAGNOSIS — M150 Primary generalized (osteo)arthritis: Secondary | ICD-10-CM | POA: Diagnosis not present

## 2018-07-25 DIAGNOSIS — J45909 Unspecified asthma, uncomplicated: Secondary | ICD-10-CM | POA: Diagnosis not present

## 2018-07-25 DIAGNOSIS — M797 Fibromyalgia: Secondary | ICD-10-CM | POA: Diagnosis not present

## 2018-07-25 DIAGNOSIS — G4730 Sleep apnea, unspecified: Secondary | ICD-10-CM | POA: Diagnosis not present

## 2018-07-25 DIAGNOSIS — Z4682 Encounter for fitting and adjustment of non-vascular catheter: Secondary | ICD-10-CM | POA: Diagnosis not present

## 2018-07-25 DIAGNOSIS — E1142 Type 2 diabetes mellitus with diabetic polyneuropathy: Secondary | ICD-10-CM | POA: Diagnosis not present

## 2018-07-25 DIAGNOSIS — M793 Panniculitis, unspecified: Secondary | ICD-10-CM | POA: Diagnosis not present

## 2018-07-25 DIAGNOSIS — Z48817 Encounter for surgical aftercare following surgery on the skin and subcutaneous tissue: Secondary | ICD-10-CM | POA: Diagnosis not present

## 2018-07-28 DIAGNOSIS — Z48817 Encounter for surgical aftercare following surgery on the skin and subcutaneous tissue: Secondary | ICD-10-CM | POA: Diagnosis not present

## 2018-07-28 DIAGNOSIS — M150 Primary generalized (osteo)arthritis: Secondary | ICD-10-CM | POA: Diagnosis not present

## 2018-07-28 DIAGNOSIS — G4730 Sleep apnea, unspecified: Secondary | ICD-10-CM | POA: Diagnosis not present

## 2018-07-28 DIAGNOSIS — J45909 Unspecified asthma, uncomplicated: Secondary | ICD-10-CM | POA: Diagnosis not present

## 2018-07-28 DIAGNOSIS — M797 Fibromyalgia: Secondary | ICD-10-CM | POA: Diagnosis not present

## 2018-07-28 DIAGNOSIS — Z4682 Encounter for fitting and adjustment of non-vascular catheter: Secondary | ICD-10-CM | POA: Diagnosis not present

## 2018-07-28 DIAGNOSIS — M793 Panniculitis, unspecified: Secondary | ICD-10-CM | POA: Diagnosis not present

## 2018-07-28 DIAGNOSIS — E1142 Type 2 diabetes mellitus with diabetic polyneuropathy: Secondary | ICD-10-CM | POA: Diagnosis not present

## 2018-07-28 DIAGNOSIS — I10 Essential (primary) hypertension: Secondary | ICD-10-CM | POA: Diagnosis not present

## 2018-07-31 DIAGNOSIS — M797 Fibromyalgia: Secondary | ICD-10-CM | POA: Diagnosis not present

## 2018-07-31 DIAGNOSIS — M150 Primary generalized (osteo)arthritis: Secondary | ICD-10-CM | POA: Diagnosis not present

## 2018-07-31 DIAGNOSIS — Z48817 Encounter for surgical aftercare following surgery on the skin and subcutaneous tissue: Secondary | ICD-10-CM | POA: Diagnosis not present

## 2018-07-31 DIAGNOSIS — Z4682 Encounter for fitting and adjustment of non-vascular catheter: Secondary | ICD-10-CM | POA: Diagnosis not present

## 2018-07-31 DIAGNOSIS — G4730 Sleep apnea, unspecified: Secondary | ICD-10-CM | POA: Diagnosis not present

## 2018-07-31 DIAGNOSIS — I10 Essential (primary) hypertension: Secondary | ICD-10-CM | POA: Diagnosis not present

## 2018-07-31 DIAGNOSIS — E1142 Type 2 diabetes mellitus with diabetic polyneuropathy: Secondary | ICD-10-CM | POA: Diagnosis not present

## 2018-07-31 DIAGNOSIS — J45909 Unspecified asthma, uncomplicated: Secondary | ICD-10-CM | POA: Diagnosis not present

## 2018-07-31 DIAGNOSIS — M793 Panniculitis, unspecified: Secondary | ICD-10-CM | POA: Diagnosis not present

## 2018-08-01 ENCOUNTER — Ambulatory Visit: Payer: Medicare HMO

## 2018-08-01 DIAGNOSIS — E1159 Type 2 diabetes mellitus with other circulatory complications: Secondary | ICD-10-CM | POA: Diagnosis not present

## 2018-08-01 DIAGNOSIS — T148XXA Other injury of unspecified body region, initial encounter: Secondary | ICD-10-CM | POA: Diagnosis not present

## 2018-08-01 DIAGNOSIS — I10 Essential (primary) hypertension: Secondary | ICD-10-CM | POA: Diagnosis not present

## 2018-08-01 DIAGNOSIS — Z79899 Other long term (current) drug therapy: Secondary | ICD-10-CM | POA: Diagnosis not present

## 2018-08-01 DIAGNOSIS — E785 Hyperlipidemia, unspecified: Secondary | ICD-10-CM | POA: Diagnosis not present

## 2018-08-01 DIAGNOSIS — E1169 Type 2 diabetes mellitus with other specified complication: Secondary | ICD-10-CM | POA: Diagnosis not present

## 2018-08-01 DIAGNOSIS — E119 Type 2 diabetes mellitus without complications: Secondary | ICD-10-CM | POA: Diagnosis not present

## 2018-08-03 DIAGNOSIS — E785 Hyperlipidemia, unspecified: Secondary | ICD-10-CM | POA: Diagnosis not present

## 2018-08-03 DIAGNOSIS — Z79899 Other long term (current) drug therapy: Secondary | ICD-10-CM | POA: Diagnosis not present

## 2018-08-03 DIAGNOSIS — E890 Postprocedural hypothyroidism: Secondary | ICD-10-CM | POA: Diagnosis not present

## 2018-08-03 DIAGNOSIS — I10 Essential (primary) hypertension: Secondary | ICD-10-CM | POA: Diagnosis not present

## 2018-08-03 DIAGNOSIS — E1159 Type 2 diabetes mellitus with other circulatory complications: Secondary | ICD-10-CM | POA: Diagnosis not present

## 2018-08-03 DIAGNOSIS — E1169 Type 2 diabetes mellitus with other specified complication: Secondary | ICD-10-CM | POA: Diagnosis not present

## 2018-08-04 DIAGNOSIS — E1142 Type 2 diabetes mellitus with diabetic polyneuropathy: Secondary | ICD-10-CM | POA: Diagnosis not present

## 2018-08-04 DIAGNOSIS — G4730 Sleep apnea, unspecified: Secondary | ICD-10-CM | POA: Diagnosis not present

## 2018-08-04 DIAGNOSIS — Z48817 Encounter for surgical aftercare following surgery on the skin and subcutaneous tissue: Secondary | ICD-10-CM | POA: Diagnosis not present

## 2018-08-04 DIAGNOSIS — Z4682 Encounter for fitting and adjustment of non-vascular catheter: Secondary | ICD-10-CM | POA: Diagnosis not present

## 2018-08-04 DIAGNOSIS — M797 Fibromyalgia: Secondary | ICD-10-CM | POA: Diagnosis not present

## 2018-08-04 DIAGNOSIS — M793 Panniculitis, unspecified: Secondary | ICD-10-CM | POA: Diagnosis not present

## 2018-08-04 DIAGNOSIS — I10 Essential (primary) hypertension: Secondary | ICD-10-CM | POA: Diagnosis not present

## 2018-08-04 DIAGNOSIS — J45909 Unspecified asthma, uncomplicated: Secondary | ICD-10-CM | POA: Diagnosis not present

## 2018-08-04 DIAGNOSIS — M150 Primary generalized (osteo)arthritis: Secondary | ICD-10-CM | POA: Diagnosis not present

## 2018-08-07 DIAGNOSIS — J45909 Unspecified asthma, uncomplicated: Secondary | ICD-10-CM | POA: Diagnosis not present

## 2018-08-07 DIAGNOSIS — Z48817 Encounter for surgical aftercare following surgery on the skin and subcutaneous tissue: Secondary | ICD-10-CM | POA: Diagnosis not present

## 2018-08-07 DIAGNOSIS — I10 Essential (primary) hypertension: Secondary | ICD-10-CM | POA: Diagnosis not present

## 2018-08-07 DIAGNOSIS — G4730 Sleep apnea, unspecified: Secondary | ICD-10-CM | POA: Diagnosis not present

## 2018-08-07 DIAGNOSIS — Z4682 Encounter for fitting and adjustment of non-vascular catheter: Secondary | ICD-10-CM | POA: Diagnosis not present

## 2018-08-07 DIAGNOSIS — M793 Panniculitis, unspecified: Secondary | ICD-10-CM | POA: Diagnosis not present

## 2018-08-07 DIAGNOSIS — M797 Fibromyalgia: Secondary | ICD-10-CM | POA: Diagnosis not present

## 2018-08-07 DIAGNOSIS — E1142 Type 2 diabetes mellitus with diabetic polyneuropathy: Secondary | ICD-10-CM | POA: Diagnosis not present

## 2018-08-07 DIAGNOSIS — M150 Primary generalized (osteo)arthritis: Secondary | ICD-10-CM | POA: Diagnosis not present

## 2018-08-08 ENCOUNTER — Ambulatory Visit: Payer: Medicare HMO

## 2018-08-08 ENCOUNTER — Encounter: Payer: Medicare HMO | Admitting: Occupational Therapy

## 2018-08-11 DIAGNOSIS — M793 Panniculitis, unspecified: Secondary | ICD-10-CM | POA: Diagnosis not present

## 2018-08-11 DIAGNOSIS — S31109A Unspecified open wound of abdominal wall, unspecified quadrant without penetration into peritoneal cavity, initial encounter: Secondary | ICD-10-CM | POA: Diagnosis not present

## 2018-08-11 DIAGNOSIS — R1032 Left lower quadrant pain: Secondary | ICD-10-CM | POA: Diagnosis not present

## 2018-08-11 DIAGNOSIS — E890 Postprocedural hypothyroidism: Secondary | ICD-10-CM | POA: Diagnosis not present

## 2018-08-11 DIAGNOSIS — M150 Primary generalized (osteo)arthritis: Secondary | ICD-10-CM | POA: Diagnosis not present

## 2018-08-11 DIAGNOSIS — J45909 Unspecified asthma, uncomplicated: Secondary | ICD-10-CM | POA: Diagnosis not present

## 2018-08-11 DIAGNOSIS — S31103A Unspecified open wound of abdominal wall, right lower quadrant without penetration into peritoneal cavity, initial encounter: Secondary | ICD-10-CM | POA: Diagnosis not present

## 2018-08-11 DIAGNOSIS — I10 Essential (primary) hypertension: Secondary | ICD-10-CM | POA: Diagnosis not present

## 2018-08-11 DIAGNOSIS — G4730 Sleep apnea, unspecified: Secondary | ICD-10-CM | POA: Diagnosis not present

## 2018-08-11 DIAGNOSIS — Z48817 Encounter for surgical aftercare following surgery on the skin and subcutaneous tissue: Secondary | ICD-10-CM | POA: Diagnosis not present

## 2018-08-11 DIAGNOSIS — K219 Gastro-esophageal reflux disease without esophagitis: Secondary | ICD-10-CM | POA: Diagnosis not present

## 2018-08-11 DIAGNOSIS — Z4682 Encounter for fitting and adjustment of non-vascular catheter: Secondary | ICD-10-CM | POA: Diagnosis not present

## 2018-08-11 DIAGNOSIS — X58XXXA Exposure to other specified factors, initial encounter: Secondary | ICD-10-CM | POA: Diagnosis not present

## 2018-08-11 DIAGNOSIS — F419 Anxiety disorder, unspecified: Secondary | ICD-10-CM | POA: Diagnosis not present

## 2018-08-11 DIAGNOSIS — E1142 Type 2 diabetes mellitus with diabetic polyneuropathy: Secondary | ICD-10-CM | POA: Diagnosis not present

## 2018-08-11 DIAGNOSIS — T8141XA Infection following a procedure, superficial incisional surgical site, initial encounter: Secondary | ICD-10-CM | POA: Diagnosis not present

## 2018-08-11 DIAGNOSIS — E785 Hyperlipidemia, unspecified: Secondary | ICD-10-CM | POA: Diagnosis not present

## 2018-08-11 DIAGNOSIS — M797 Fibromyalgia: Secondary | ICD-10-CM | POA: Diagnosis not present

## 2018-08-11 DIAGNOSIS — T8149XA Infection following a procedure, other surgical site, initial encounter: Secondary | ICD-10-CM | POA: Insufficient documentation

## 2018-08-11 DIAGNOSIS — R509 Fever, unspecified: Secondary | ICD-10-CM | POA: Diagnosis not present

## 2018-08-11 DIAGNOSIS — E1140 Type 2 diabetes mellitus with diabetic neuropathy, unspecified: Secondary | ICD-10-CM | POA: Diagnosis not present

## 2018-08-11 DIAGNOSIS — L089 Local infection of the skin and subcutaneous tissue, unspecified: Secondary | ICD-10-CM | POA: Diagnosis not present

## 2018-08-12 DIAGNOSIS — E785 Hyperlipidemia, unspecified: Secondary | ICD-10-CM | POA: Diagnosis not present

## 2018-08-12 DIAGNOSIS — J45909 Unspecified asthma, uncomplicated: Secondary | ICD-10-CM | POA: Diagnosis not present

## 2018-08-12 DIAGNOSIS — F419 Anxiety disorder, unspecified: Secondary | ICD-10-CM | POA: Diagnosis not present

## 2018-08-12 DIAGNOSIS — I10 Essential (primary) hypertension: Secondary | ICD-10-CM | POA: Diagnosis not present

## 2018-08-12 DIAGNOSIS — T8141XA Infection following a procedure, superficial incisional surgical site, initial encounter: Secondary | ICD-10-CM | POA: Diagnosis not present

## 2018-08-12 DIAGNOSIS — E1140 Type 2 diabetes mellitus with diabetic neuropathy, unspecified: Secondary | ICD-10-CM | POA: Diagnosis not present

## 2018-08-12 DIAGNOSIS — M797 Fibromyalgia: Secondary | ICD-10-CM | POA: Diagnosis not present

## 2018-08-12 DIAGNOSIS — K219 Gastro-esophageal reflux disease without esophagitis: Secondary | ICD-10-CM | POA: Diagnosis not present

## 2018-08-12 DIAGNOSIS — E890 Postprocedural hypothyroidism: Secondary | ICD-10-CM | POA: Diagnosis not present

## 2018-08-15 DIAGNOSIS — J45909 Unspecified asthma, uncomplicated: Secondary | ICD-10-CM | POA: Diagnosis not present

## 2018-08-15 DIAGNOSIS — E1142 Type 2 diabetes mellitus with diabetic polyneuropathy: Secondary | ICD-10-CM | POA: Diagnosis not present

## 2018-08-15 DIAGNOSIS — M793 Panniculitis, unspecified: Secondary | ICD-10-CM | POA: Diagnosis not present

## 2018-08-15 DIAGNOSIS — M150 Primary generalized (osteo)arthritis: Secondary | ICD-10-CM | POA: Diagnosis not present

## 2018-08-15 DIAGNOSIS — M797 Fibromyalgia: Secondary | ICD-10-CM | POA: Diagnosis not present

## 2018-08-15 DIAGNOSIS — I10 Essential (primary) hypertension: Secondary | ICD-10-CM | POA: Diagnosis not present

## 2018-08-15 DIAGNOSIS — G4730 Sleep apnea, unspecified: Secondary | ICD-10-CM | POA: Diagnosis not present

## 2018-08-15 DIAGNOSIS — Z4682 Encounter for fitting and adjustment of non-vascular catheter: Secondary | ICD-10-CM | POA: Diagnosis not present

## 2018-08-15 DIAGNOSIS — Z48817 Encounter for surgical aftercare following surgery on the skin and subcutaneous tissue: Secondary | ICD-10-CM | POA: Diagnosis not present

## 2018-08-16 DIAGNOSIS — Z4682 Encounter for fitting and adjustment of non-vascular catheter: Secondary | ICD-10-CM | POA: Diagnosis not present

## 2018-08-16 DIAGNOSIS — J45909 Unspecified asthma, uncomplicated: Secondary | ICD-10-CM | POA: Diagnosis not present

## 2018-08-16 DIAGNOSIS — E1142 Type 2 diabetes mellitus with diabetic polyneuropathy: Secondary | ICD-10-CM | POA: Diagnosis not present

## 2018-08-16 DIAGNOSIS — I10 Essential (primary) hypertension: Secondary | ICD-10-CM | POA: Diagnosis not present

## 2018-08-16 DIAGNOSIS — M797 Fibromyalgia: Secondary | ICD-10-CM | POA: Diagnosis not present

## 2018-08-16 DIAGNOSIS — T8189XA Other complications of procedures, not elsewhere classified, initial encounter: Secondary | ICD-10-CM | POA: Diagnosis not present

## 2018-08-16 DIAGNOSIS — M150 Primary generalized (osteo)arthritis: Secondary | ICD-10-CM | POA: Diagnosis not present

## 2018-08-16 DIAGNOSIS — G4730 Sleep apnea, unspecified: Secondary | ICD-10-CM | POA: Diagnosis not present

## 2018-08-16 DIAGNOSIS — Z48817 Encounter for surgical aftercare following surgery on the skin and subcutaneous tissue: Secondary | ICD-10-CM | POA: Diagnosis not present

## 2018-08-16 DIAGNOSIS — M793 Panniculitis, unspecified: Secondary | ICD-10-CM | POA: Diagnosis not present

## 2018-08-17 DIAGNOSIS — G4730 Sleep apnea, unspecified: Secondary | ICD-10-CM | POA: Diagnosis not present

## 2018-08-17 DIAGNOSIS — M793 Panniculitis, unspecified: Secondary | ICD-10-CM | POA: Diagnosis not present

## 2018-08-17 DIAGNOSIS — Z4682 Encounter for fitting and adjustment of non-vascular catheter: Secondary | ICD-10-CM | POA: Diagnosis not present

## 2018-08-17 DIAGNOSIS — M150 Primary generalized (osteo)arthritis: Secondary | ICD-10-CM | POA: Diagnosis not present

## 2018-08-17 DIAGNOSIS — E1142 Type 2 diabetes mellitus with diabetic polyneuropathy: Secondary | ICD-10-CM | POA: Diagnosis not present

## 2018-08-17 DIAGNOSIS — J45909 Unspecified asthma, uncomplicated: Secondary | ICD-10-CM | POA: Diagnosis not present

## 2018-08-17 DIAGNOSIS — M797 Fibromyalgia: Secondary | ICD-10-CM | POA: Diagnosis not present

## 2018-08-17 DIAGNOSIS — Z48817 Encounter for surgical aftercare following surgery on the skin and subcutaneous tissue: Secondary | ICD-10-CM | POA: Diagnosis not present

## 2018-08-17 DIAGNOSIS — I10 Essential (primary) hypertension: Secondary | ICD-10-CM | POA: Diagnosis not present

## 2018-08-18 DIAGNOSIS — Z4682 Encounter for fitting and adjustment of non-vascular catheter: Secondary | ICD-10-CM | POA: Diagnosis not present

## 2018-08-18 DIAGNOSIS — G4730 Sleep apnea, unspecified: Secondary | ICD-10-CM | POA: Diagnosis not present

## 2018-08-18 DIAGNOSIS — M797 Fibromyalgia: Secondary | ICD-10-CM | POA: Diagnosis not present

## 2018-08-18 DIAGNOSIS — J45909 Unspecified asthma, uncomplicated: Secondary | ICD-10-CM | POA: Diagnosis not present

## 2018-08-18 DIAGNOSIS — M150 Primary generalized (osteo)arthritis: Secondary | ICD-10-CM | POA: Diagnosis not present

## 2018-08-18 DIAGNOSIS — M793 Panniculitis, unspecified: Secondary | ICD-10-CM | POA: Diagnosis not present

## 2018-08-18 DIAGNOSIS — I10 Essential (primary) hypertension: Secondary | ICD-10-CM | POA: Diagnosis not present

## 2018-08-18 DIAGNOSIS — Z48817 Encounter for surgical aftercare following surgery on the skin and subcutaneous tissue: Secondary | ICD-10-CM | POA: Diagnosis not present

## 2018-08-18 DIAGNOSIS — E1142 Type 2 diabetes mellitus with diabetic polyneuropathy: Secondary | ICD-10-CM | POA: Diagnosis not present

## 2018-08-19 DIAGNOSIS — Z48817 Encounter for surgical aftercare following surgery on the skin and subcutaneous tissue: Secondary | ICD-10-CM | POA: Diagnosis not present

## 2018-08-19 DIAGNOSIS — M797 Fibromyalgia: Secondary | ICD-10-CM | POA: Diagnosis not present

## 2018-08-19 DIAGNOSIS — E1142 Type 2 diabetes mellitus with diabetic polyneuropathy: Secondary | ICD-10-CM | POA: Diagnosis not present

## 2018-08-19 DIAGNOSIS — G4730 Sleep apnea, unspecified: Secondary | ICD-10-CM | POA: Diagnosis not present

## 2018-08-19 DIAGNOSIS — M150 Primary generalized (osteo)arthritis: Secondary | ICD-10-CM | POA: Diagnosis not present

## 2018-08-19 DIAGNOSIS — J45909 Unspecified asthma, uncomplicated: Secondary | ICD-10-CM | POA: Diagnosis not present

## 2018-08-19 DIAGNOSIS — Z4682 Encounter for fitting and adjustment of non-vascular catheter: Secondary | ICD-10-CM | POA: Diagnosis not present

## 2018-08-19 DIAGNOSIS — M793 Panniculitis, unspecified: Secondary | ICD-10-CM | POA: Diagnosis not present

## 2018-08-19 DIAGNOSIS — I10 Essential (primary) hypertension: Secondary | ICD-10-CM | POA: Diagnosis not present

## 2018-08-21 DIAGNOSIS — T8189XA Other complications of procedures, not elsewhere classified, initial encounter: Secondary | ICD-10-CM | POA: Diagnosis not present

## 2018-08-22 DIAGNOSIS — G4730 Sleep apnea, unspecified: Secondary | ICD-10-CM | POA: Diagnosis not present

## 2018-08-22 DIAGNOSIS — M793 Panniculitis, unspecified: Secondary | ICD-10-CM | POA: Diagnosis not present

## 2018-08-22 DIAGNOSIS — Z48817 Encounter for surgical aftercare following surgery on the skin and subcutaneous tissue: Secondary | ICD-10-CM | POA: Diagnosis not present

## 2018-08-22 DIAGNOSIS — J45909 Unspecified asthma, uncomplicated: Secondary | ICD-10-CM | POA: Diagnosis not present

## 2018-08-22 DIAGNOSIS — E1142 Type 2 diabetes mellitus with diabetic polyneuropathy: Secondary | ICD-10-CM | POA: Diagnosis not present

## 2018-08-22 DIAGNOSIS — M150 Primary generalized (osteo)arthritis: Secondary | ICD-10-CM | POA: Diagnosis not present

## 2018-08-22 DIAGNOSIS — Z4682 Encounter for fitting and adjustment of non-vascular catheter: Secondary | ICD-10-CM | POA: Diagnosis not present

## 2018-08-22 DIAGNOSIS — M797 Fibromyalgia: Secondary | ICD-10-CM | POA: Diagnosis not present

## 2018-08-22 DIAGNOSIS — I10 Essential (primary) hypertension: Secondary | ICD-10-CM | POA: Diagnosis not present

## 2018-08-23 ENCOUNTER — Encounter: Payer: Self-pay | Admitting: Nurse Practitioner

## 2018-08-23 ENCOUNTER — Ambulatory Visit: Payer: Medicare HMO | Attending: Nurse Practitioner | Admitting: Nurse Practitioner

## 2018-08-23 VITALS — BP 101/70 | HR 106 | Temp 98.7°F | Resp 16 | Ht 63.0 in | Wt 344.0 lb

## 2018-08-23 DIAGNOSIS — K219 Gastro-esophageal reflux disease without esophagitis: Secondary | ICD-10-CM | POA: Diagnosis not present

## 2018-08-23 DIAGNOSIS — E559 Vitamin D deficiency, unspecified: Secondary | ICD-10-CM | POA: Insufficient documentation

## 2018-08-23 DIAGNOSIS — Z6841 Body Mass Index (BMI) 40.0 and over, adult: Secondary | ICD-10-CM | POA: Diagnosis not present

## 2018-08-23 DIAGNOSIS — M170 Bilateral primary osteoarthritis of knee: Secondary | ICD-10-CM | POA: Diagnosis not present

## 2018-08-23 DIAGNOSIS — M25551 Pain in right hip: Secondary | ICD-10-CM | POA: Diagnosis not present

## 2018-08-23 DIAGNOSIS — M19012 Primary osteoarthritis, left shoulder: Secondary | ICD-10-CM | POA: Diagnosis not present

## 2018-08-23 DIAGNOSIS — Z818 Family history of other mental and behavioral disorders: Secondary | ICD-10-CM | POA: Insufficient documentation

## 2018-08-23 DIAGNOSIS — Z886 Allergy status to analgesic agent status: Secondary | ICD-10-CM | POA: Insufficient documentation

## 2018-08-23 DIAGNOSIS — G8929 Other chronic pain: Secondary | ICD-10-CM

## 2018-08-23 DIAGNOSIS — E119 Type 2 diabetes mellitus without complications: Secondary | ICD-10-CM | POA: Diagnosis not present

## 2018-08-23 DIAGNOSIS — M47817 Spondylosis without myelopathy or radiculopathy, lumbosacral region: Secondary | ICD-10-CM | POA: Insufficient documentation

## 2018-08-23 DIAGNOSIS — Z9049 Acquired absence of other specified parts of digestive tract: Secondary | ICD-10-CM | POA: Insufficient documentation

## 2018-08-23 DIAGNOSIS — Z881 Allergy status to other antibiotic agents status: Secondary | ICD-10-CM | POA: Insufficient documentation

## 2018-08-23 DIAGNOSIS — J45909 Unspecified asthma, uncomplicated: Secondary | ICD-10-CM | POA: Diagnosis not present

## 2018-08-23 DIAGNOSIS — Z4682 Encounter for fitting and adjustment of non-vascular catheter: Secondary | ICD-10-CM | POA: Diagnosis not present

## 2018-08-23 DIAGNOSIS — M1611 Unilateral primary osteoarthritis, right hip: Secondary | ICD-10-CM | POA: Diagnosis not present

## 2018-08-23 DIAGNOSIS — E1142 Type 2 diabetes mellitus with diabetic polyneuropathy: Secondary | ICD-10-CM | POA: Diagnosis not present

## 2018-08-23 DIAGNOSIS — E042 Nontoxic multinodular goiter: Secondary | ICD-10-CM | POA: Insufficient documentation

## 2018-08-23 DIAGNOSIS — Z96642 Presence of left artificial hip joint: Secondary | ICD-10-CM | POA: Insufficient documentation

## 2018-08-23 DIAGNOSIS — M792 Neuralgia and neuritis, unspecified: Secondary | ICD-10-CM | POA: Diagnosis not present

## 2018-08-23 DIAGNOSIS — Z882 Allergy status to sulfonamides status: Secondary | ICD-10-CM | POA: Insufficient documentation

## 2018-08-23 DIAGNOSIS — Z7984 Long term (current) use of oral hypoglycemic drugs: Secondary | ICD-10-CM | POA: Diagnosis not present

## 2018-08-23 DIAGNOSIS — E282 Polycystic ovarian syndrome: Secondary | ICD-10-CM | POA: Diagnosis not present

## 2018-08-23 DIAGNOSIS — G894 Chronic pain syndrome: Secondary | ICD-10-CM | POA: Insufficient documentation

## 2018-08-23 DIAGNOSIS — M150 Primary generalized (osteo)arthritis: Secondary | ICD-10-CM | POA: Diagnosis not present

## 2018-08-23 DIAGNOSIS — Z8614 Personal history of Methicillin resistant Staphylococcus aureus infection: Secondary | ICD-10-CM | POA: Diagnosis not present

## 2018-08-23 DIAGNOSIS — F339 Major depressive disorder, recurrent, unspecified: Secondary | ICD-10-CM | POA: Insufficient documentation

## 2018-08-23 DIAGNOSIS — F411 Generalized anxiety disorder: Secondary | ICD-10-CM | POA: Insufficient documentation

## 2018-08-23 DIAGNOSIS — Z793 Long term (current) use of hormonal contraceptives: Secondary | ICD-10-CM | POA: Insufficient documentation

## 2018-08-23 DIAGNOSIS — Z79899 Other long term (current) drug therapy: Secondary | ICD-10-CM | POA: Insufficient documentation

## 2018-08-23 DIAGNOSIS — Z8249 Family history of ischemic heart disease and other diseases of the circulatory system: Secondary | ICD-10-CM | POA: Insufficient documentation

## 2018-08-23 DIAGNOSIS — M47816 Spondylosis without myelopathy or radiculopathy, lumbar region: Secondary | ICD-10-CM | POA: Insufficient documentation

## 2018-08-23 DIAGNOSIS — G4730 Sleep apnea, unspecified: Secondary | ICD-10-CM | POA: Diagnosis not present

## 2018-08-23 DIAGNOSIS — M797 Fibromyalgia: Secondary | ICD-10-CM | POA: Diagnosis not present

## 2018-08-23 DIAGNOSIS — E785 Hyperlipidemia, unspecified: Secondary | ICD-10-CM | POA: Insufficient documentation

## 2018-08-23 DIAGNOSIS — M793 Panniculitis, unspecified: Secondary | ICD-10-CM | POA: Diagnosis not present

## 2018-08-23 DIAGNOSIS — Z91013 Allergy to seafood: Secondary | ICD-10-CM | POA: Insufficient documentation

## 2018-08-23 DIAGNOSIS — Z79891 Long term (current) use of opiate analgesic: Secondary | ICD-10-CM

## 2018-08-23 DIAGNOSIS — Z48817 Encounter for surgical aftercare following surgery on the skin and subcutaneous tissue: Secondary | ICD-10-CM | POA: Diagnosis not present

## 2018-08-23 DIAGNOSIS — Z79890 Hormone replacement therapy (postmenopausal): Secondary | ICD-10-CM | POA: Insufficient documentation

## 2018-08-23 DIAGNOSIS — I10 Essential (primary) hypertension: Secondary | ICD-10-CM | POA: Insufficient documentation

## 2018-08-23 DIAGNOSIS — Z9889 Other specified postprocedural states: Secondary | ICD-10-CM | POA: Insufficient documentation

## 2018-08-23 DIAGNOSIS — Z833 Family history of diabetes mellitus: Secondary | ICD-10-CM | POA: Insufficient documentation

## 2018-08-23 DIAGNOSIS — T8189XA Other complications of procedures, not elsewhere classified, initial encounter: Secondary | ICD-10-CM | POA: Diagnosis not present

## 2018-08-23 MED ORDER — OXYCODONE HCL 5 MG PO TABS: 5.0000 mg | ORAL_TABLET | Freq: Every day | ORAL | 0 refills | Status: DC | PRN

## 2018-08-23 MED ORDER — GABAPENTIN 800 MG PO TABS: 1600.0000 mg | ORAL_TABLET | Freq: Two times a day (BID) | ORAL | 2 refills | Status: DC

## 2018-08-23 MED ORDER — MAGNESIUM OXIDE 400 MG PO TABS: 400.0000 mg | ORAL_TABLET | Freq: Two times a day (BID) | ORAL | 2 refills | Status: DC

## 2018-08-23 NOTE — Patient Instructions (Addendum)
____________________________________________________________________________________________  Medication Rules  Purpose: To inform patients, and their family members, of our rules and regulations.  Applies to: All patients receiving prescriptions (written or electronic).  Pharmacy of record: Pharmacy where electronic prescriptions will be sent. If written prescriptions are taken to a different pharmacy, please inform the nursing staff. The pharmacy listed in the electronic medical record should be the one where you would like electronic prescriptions to be sent.  Electronic prescriptions: In compliance with the  Strengthen Opioid Misuse Prevention (STOP) Act of 2017 (Session Law 2017-74/H243), effective September 13, 2018, all controlled substances must be electronically prescribed. Calling prescriptions to the pharmacy will cease to exist.  Prescription refills: Only during scheduled appointments. Applies to all prescriptions.  NOTE: The following applies primarily to controlled substances (Opioid* Pain Medications).   Patient's responsibilities: 1. Pain Pills: Bring all pain pills to every appointment (except for procedure appointments). 2. Pill Bottles: Bring pills in original pharmacy bottle. Always bring the newest bottle. Bring bottle, even if empty. 3. Medication refills: You are responsible for knowing and keeping track of what medications you take and those you need refilled. The day before your appointment: write a list of all prescriptions that need to be refilled. The day of the appointment: give the list to the admitting nurse. Prescriptions will be written only during appointments. If you forget a medication: it will not be "Called in", "Faxed", or "electronically sent". You will need to get another appointment to get these prescribed. No early refills. Do not call asking to have your prescription filled early. 4. Prescription Accuracy: You are responsible for  carefully inspecting your prescriptions before leaving our office. Have the discharge nurse carefully go over each prescription with you, before taking them home. Make sure that your name is accurately spelled, that your address is correct. Check the name and dose of your medication to make sure it is accurate. Check the number of pills, and the written instructions to make sure they are clear and accurate. Make sure that you are given enough medication to last until your next medication refill appointment. 5. Taking Medication: Take medication as prescribed. When it comes to controlled substances, taking less pills or less frequently than prescribed is permitted and encouraged. Never take more pills than instructed. Never take medication more frequently than prescribed.  6. Inform other Doctors: Always inform, all of your healthcare providers, of all the medications you take. 7. Pain Medication from other Providers: You are not allowed to accept any additional pain medication from any other Doctor or Healthcare provider. There are two exceptions to this rule. (see below) In the event that you require additional pain medication, you are responsible for notifying us, as stated below. 8. Medication Agreement: You are responsible for carefully reading and following our Medication Agreement. This must be signed before receiving any prescriptions from our practice. Safely store a copy of your signed Agreement. Violations to the Agreement will result in no further prescriptions. (Additional copies of our Medication Agreement are available upon request.) 9. Laws, Rules, & Regulations: All patients are expected to follow all Federal and State Laws, Statutes, Rules, & Regulations. Ignorance of the Laws does not constitute a valid excuse. The use of any illegal substances is prohibited. 10. Adopted CDC guidelines & recommendations: Target dosing levels will be at or below 60 MME/day. Use of benzodiazepines** is not  recommended.  Exceptions: There are only two exceptions to the rule of not receiving pain medications from other Healthcare Providers. 1.   Exception #1 (Emergencies): In the event of an emergency (i.e.: accident requiring emergency care), you are allowed to receive additional pain medication. However, you are responsible for: As soon as you are able, call our office [phone], at any time of the day or night, and leave a message stating your name, the date and nature of the emergency, and the name and dose of the medication prescribed. In the event that your call is answered by a member of our staff, make sure to document and save the date, time, and the name of the person that took your information.  2. Exception #2 (Planned Surgery): In the event that you are scheduled by another doctor or dentist to have any type of surgery or procedure, you are allowed (for a period no longer than 30 days), to receive additional pain medication, for the acute post-op pain. However, in this case, you are responsible for picking up a copy of our "Post-op Pain Management for Surgeons" handout, and giving it to your surgeon or dentist. This document is available at our office, and does not require an appointment to obtain it. Simply go to our office during business hours (Monday-Thursday from 8:00 AM to 4:00 PM) (Friday 8:00 AM to 12:00 Noon) or if you have a scheduled appointment with [place], prior to your surgery, and ask for it by name. In addition, you will need to provide [place] with your name, name of your surgeon, type of surgery, and date of procedure or surgery.  *Opioid medications include: morphine, codeine, oxycodone, oxymorphone, hydrocodone, hydromorphone, meperidine, tramadol, tapentadol, buprenorphine, fentanyl, methadone. **Benzodiazepine medications include: diazepam (Valium), alprazolam (Xanax), clonazepam (Klonopine), lorazepam (Ativan), clorazepate (Tranxene), chlordiazepoxide (Librium), estazolam (Prosom),  oxazepam (Serax), temazepam (Restoril), triazolam (Halcion) (Last updated: 11/10/2017) ____________________________________________________________________________________________   BMI Assessment: Estimated body mass index is 60.94 kg/m as calculated from the following:   Height as of this encounter: 5\' 3"  (1.6 m).   Weight as of this encounter: 344 lb (156 kg).  BMI interpretation table: BMI level Category Range association with higher incidence of chronic pain  <18 kg/m2 Underweight   18.5-24.9 kg/m2 Ideal body weight   25-29.9 kg/m2 Overweight Increased incidence by 20%  30-34.9 kg/m2 Obese (Class I) Increased incidence by 68%  35-39.9 kg/m2 Severe obesity (Class II) Increased incidence by 136%  >40 kg/m2 Extreme obesity (Class III) Increased incidence by 254%   Patient's current BMI Ideal Body weight  Body mass index is 60.94 kg/m. Ideal body weight: 52.4 kg (115 lb 8.3 oz) Adjusted ideal body weight: 93.9 kg (206 lb 14.6 oz)   BMI Readings from Last 4 Encounters:  08/23/18 60.94 kg/m  05/17/18 66.77 kg/m  04/27/18 66.09 kg/m  04/13/18 65.74 kg/m   Wt Readings from Last 4 Encounters:  08/23/18 (!) 344 lb (156 kg)  05/17/18 (!) 389 lb (176.4 kg)  04/27/18 (!) 385 lb (174.6 kg)  04/13/18 (!) 383 lb (173.7 kg)   Oxycodone 5 mg x 3 months printed and given to patient to begin filling on 08/23/18 to last until 11/22/18

## 2018-08-23 NOTE — Progress Notes (Signed)
Patient's Name: Karen Lewis  MRN: [id number]  Referring Provider: Idelle Crouch, MD  DOB: Feb 16, 1971  PCP: Karen Crouch, MD  DOS: 08/23/2018  Note by: Karen Francois NP  Service setting: Ambulatory outpatient  Specialty: Interventional Pain Management  Location: [facility] (AMB) Pain Management Facility    Patient type: Established    Primary Reason(s) for Visit: Encounter for prescription drug management. (Level of risk: moderate)  CC: Hip Pain (right); Back Pain (mid to lower); and Knee Pain (right )  HPI  Karen Lewis is a 47 y.o. year old, female patient, who comes today for a medication management evaluation. She has History of asthma; Binge eating disorder; Fibromyalgia; Anxiety, generalized; Insomnia, persistent; Depression, major, recurrent, moderate ([place]); Uncomplicated asthma; Eczema; Gout; Hyperlipidemia; Headache, migraine; Obstructive apnea; Bilateral polycystic ovarian syndrome; Apnea, sleep; Disease of thyroid gland; Avitaminosis D; History of surgical procedure; Type 2 diabetes mellitus without complication, without long-term current use of insulin ([place]); Long term current use of opiate analgesic; Long term prescription opiate use; Opiate use; Encounter for therapeutic drug level monitoring; Opiate dependence ([place]); Hypertension associated with diabetes ([place]); Goiter, nontoxic, multinodular; Chronic knee pain (Primary Source of Pain) (Bilateral) (R>L); Chronic low back pain [facility] source of pain) (Bilateral) (R>L); Lumbar facet syndrome (Bilateral) (R>L); Osteoarthritis; Grade 1 (1.4 cm) Anterolisthesis of L4 over L5; Chronic hip pain (Secondary source of pain) (Right); S/P THR (total hip replacement) (Left); History of methicillin resistant staphylococcus aureus (MRSA); History of bariatric surgery; Hypomagnesemia; Morbid obesity with body mass index (BMI) greater than or equal to 70 in adult [facility]); History of methicillin resistant Staphylococcus aureus infection; Lumbar spondylosis;  Vitamin D insufficiency; Dysphagia, unspecified; Gastroesophageal reflux disease without esophagitis; Chest pain with low risk of acute coronary syndrome; Nontoxic goiter; Other psychoactive substance dependence, uncomplicated (HCC); SOB (shortness of breath) on exertion; Chronic pain syndrome; Neurogenic pain; Upper extremity pain; Chronic shoulder pain (Left); Osteoarthritis of shoulder (Left); Controlled drug dependence ([place]); Osteoarthritis of hip (Right); Hyperlipidemia due to type 2 diabetes mellitus ([place]); Post op infection; Secondary Osteoarthritis of knee (Bilateral) (R>L); Major depressive disorder, single episode; Depression; Dermatitis; Asthma; Encounter for dental examination; Encounter for dental exam and cleaning w/o abnormal findings; Spondylosis without myelopathy or radiculopathy, lumbosacral region; History of gastric bypass; Allergy to iodine; H/O allergy to shellfish; and Panniculitis on their problem list. Her primarily concern today is the Hip Pain (right); Back Pain (mid to lower); and Knee Pain (right )  Pain Assessment: Location: Right Hip(see visit info for pain sites ) Radiating: right hip pain into the right leg.  knee pain goes down the leg.  back pain goes around the side and across the back  Onset: More than a month ago Duration: Chronic pain Quality: Aching, Sharp, Discomfort, Constant, Tightness Severity: 4 /10 (subjective, self-reported pain score)  Note: Reported level is compatible with observation.                          Effect on ADL: feels like PT might be aggravating it.  S/p tummy tuck that currently has wound vac in place.  Timing: Constant Modifying factors: heating pad, max freeze topical.  pain medication, rest.  PT  BP: 101/70  HR: (!) 106  Karen Lewis was last scheduled for an appointment on 05/17/2018 for medication management. During today's appointment we reviewed Karen Lewis's chronic pain status, as well as her outpatient medication regimen. She is  status post panniculectomy. She is down 40 pounds.  She has a wound vac. She is planning to remove this on next week. She has a deep ache in her back.  She has plans to have hip surgery in the future.  The patient  reports no history of drug use. Her body mass index is 60.94 kg/m.  Further details on both, my assessment(s), as well as the proposed treatment plan, please see below.  Controlled Substance Pharmacotherapy Assessment REMS (Risk Evaluation and Mitigation Strategy)  Analgesic:Oxycodone/APAP 5/325 one every 6 hours when necessary for pain (20 mg/day) MME/day:30 mg/day. Karen Billow, RN  08/23/2018  8:54 AM  Sign when Signing Visit Nursing Pain Medication Assessment:  Safety precautions to be maintained throughout the outpatient stay will include: orient to surroundings, keep bed in low position, maintain call bell within reach at all times, provide assistance with transfer out of bed and ambulation.  Medication Inspection Compliance: Pill count conducted under aseptic conditions, in front of the patient. Neither the pills nor the bottle was removed from the patient's sight at any time. Once count was completed pills were immediately returned to the patient in their original bottle.  Medication: Oxycodone IR Pill/Patch Count: 7 of 150 pills remain Pill/Patch Appearance: Markings consistent with prescribed medication Bottle Appearance: Standard pharmacy container. Clearly labeled. Filled Date: 10 / 05 / 2019 Last Medication intake:  Today   Pharmacokinetics: Liberation and absorption (onset of action): WNL Distribution (time to peak effect): WNL Metabolism and excretion (duration of action): WNL         Pharmacodynamics: Desired effects: Analgesia: Karen Lewis reports >50% benefit. Functional ability: Patient reports that medication allows her to accomplish basic ADLs Clinically meaningful improvement in function (CMIF): Sustained CMIF goals met Perceived effectiveness:  Described as relatively effective, allowing for increase in activities of daily living (ADL) Undesirable effects: Side-effects or Adverse reactions: None reported Monitoring: [place] PMP: Online review of the past 59-monthperiod conducted. Compliant with practice rules and regulations Last UDS on record: Summary  Date Value Ref Range Status  01/30/2018 FINAL  Final    Comment:    ==================================================================== TOXASSURE SELECT 13 (MW) ==================================================================== Test                             Result       Flag       Units Drug Present and Declared for Prescription Verification   Oxycodone                      463          EXPECTED   ng/mg creat   Noroxycodone                   1288         EXPECTED   ng/mg creat    Sources of oxycodone include scheduled prescription medications.    Noroxycodone is an expected metabolite of oxycodone. Drug Absent but Declared for Prescription Verification   Codeine                        Not Detected UNEXPECTED ng/mg creat   Butalbital                     Not Detected UNEXPECTED ==================================================================== Test                      Result    Flag  Units      Ref Range   Creatinine              268              mg/dL      >=20 ==================================================================== Declared Medications:  The flagging and interpretation on this report are based on the  following declared medications.  Unexpected results may arise from  inaccuracies in the declared medications.  **Note: The testing scope of this panel includes these medications:  Butalbital (Fioricet)  Codeine  Oxycodone  **Note: The testing scope of this panel does not include following  reported medications:  Acetaminophen  Acetaminophen (Fioricet)  Acyclovir (Zovirax)  Albuterol (Duoneb)  Albuterol (Proventil)  Amitriptyline  Diphenhydramine  (Benadryl)  Estradiol (Estrace)  Famotidine (Pepcid)  Gabapentin  Guaifenesin  Ipratropium (Duoneb)  Ketoconazole (Nizoral)  Levocetirizine (Xyzal)  Levothyroxine (Synthroid)  Magnesium Oxide  Meclizine  Medroxyprogesterone (Depo-Provera)  Metformin  Metoprolol  Montelukast (Singulair)  Multivitamin  Naloxone  Nitrofurantoin (Macrobid)  Omeprazole (Prilosec)  Phentermine  Pioglitazone (Actos)  Quetiapine (Seroquel)  Ranitidine (Zantac)  Rosuvastatin (Crestor)  Sertraline (Zoloft)  Spironolactone (Aldactone)  Tizanidine (Zanaflex)  Triamcinolone (Kenalog)  Triamcinolone (Nasacort)  Zonisamide (Zonegran) ==================================================================== For clinical consultation, please call [phone]. ====================================================================    UDS interpretation: Compliant          Medication Assessment Form: Reviewed. Patient indicates being compliant with therapy Treatment compliance: Compliant Risk Assessment Profile: Aberrant behavior: See prior evaluations. None observed or detected today Comorbid factors increasing risk of overdose: See prior notes. No additional risks detected today Opioid risk tool (ORT) (Total Score):   Personal History of Substance Abuse (SUD-Substance use disorder):  Alcohol:    Illegal Drugs:    Rx Drugs:    ORT Risk Level calculation:   Risk of substance use disorder (SUD): Low  ORT Scoring interpretation table:  Score <3 = Low Risk for SUD  Score between 4-7 = Moderate Risk for SUD  Score >8 = High Risk for Opioid Abuse   Risk Mitigation Strategies:  Patient Counseling: Covered Patient-Prescriber Agreement (PPA): Present and active  Notification to other healthcare providers: Done  Pharmacologic Plan: No change in therapy, at this time.             Laboratory Chemistry  Inflammation Markers (CRP: Acute Phase) (ESR: Chronic Phase) Lab Results  Component Value Date   CRP  <0.5 11/24/2015   ESRSEDRATE 20 01/30/2018                         Rheumatology Markers No results found for: RF, ANA, LABURIC, URICUR, LYMEIGGIGMAB, LYMEABIGMQN, HLAB27                      Renal Function Markers Lab Results  Component Value Date   BUN 8 11/24/2015   CREATININE 0.80 05/11/2016   GFRAA >60 11/24/2015   GFRNONAA >60 11/24/2015                             Hepatic Function Markers Lab Results  Component Value Date   AST 23 11/24/2015   ALT 20 11/24/2015   ALBUMIN 3.7 11/24/2015   ALKPHOS 81 11/24/2015                        Electrolytes Lab Results  Component Value Date   NA 139 11/24/2015  K 4.3 11/24/2015   CL 105 11/24/2015   CALCIUM 8.6 (L) 11/24/2015   MG 1.6 01/30/2018                        Neuropathy Markers Lab Results  Component Value Date   VITAMINB12 303 11/24/2015                        CNS Tests No results found for: COLORCSF, APPEARCSF, RBCCOUNTCSF, WBCCSF, POLYSCSF, LYMPHSCSF, EOSCSF, PROTEINCSF, GLUCCSF, JCVIRUS, CSFOLI, IGGCSF                      Bone Pathology Markers Lab Results  Component Value Date   VD25OH 27.4 (L) 11/24/2015   VD125OH2TOT 41.5 11/24/2015                         Coagulation Parameters Lab Results  Component Value Date   INR 0.95 05/07/2015   LABPROT 12.9 05/07/2015   APTT 26 05/07/2015   PLT 337 11/12/2015                        Cardiovascular Markers Lab Results  Component Value Date   HGB 12.4 11/12/2015   HCT 38.0 11/12/2015                         CA Markers No results found for: CEA, CA125, LABCA2                      Note: Lab results reviewed.  Recent Diagnostic Imaging Results  MM 3D SCREEN BREAST BILATERAL CLINICAL DATA:  Screening.  EXAM: DIGITAL SCREENING BILATERAL MAMMOGRAM WITH TOMO AND CAD  COMPARISON:  Previous exam(s).  ACR Breast Density Category b: There are scattered areas of fibroglandular density.  FINDINGS: There are no findings suspicious for malignancy.  Images were processed with CAD.  IMPRESSION: No mammographic evidence of malignancy. A result letter of this screening mammogram will be mailed directly to the patient.  RECOMMENDATION: Screening mammogram in one year. (Code:[id number])  BI-RADS CATEGORY  1: Negative.  Electronically Signed   By: Fidela Salisbury M.D.   On: 05/04/2018 10:09  Complexity Note: Imaging results reviewed. Results shared with Ms. Brigitte Pulse, using Layman's terms.                         Meds   Current Outpatient Medications:  .  Acetaminophen 500 MG coapsule, Take 500 mg by mouth every 6 (six) hours as needed. , Disp: , Rfl:  .  acyclovir (ZOVIRAX) 800 MG tablet, Take 800 mg by mouth as needed. Reported on 11/12/2015, Disp: , Rfl:  .  albuterol (PROVENTIL HFA;VENTOLIN HFA) 108 (90 BASE) MCG/ACT inhaler, Inhale 2 puffs into the lungs every 6 (six) hours as needed for wheezing or shortness of breath., Disp: , Rfl:  .  amitriptyline (ELAVIL) 100 MG tablet, , Disp: , Rfl: 3 .  Biotin 10 MG CAPS, , Disp: , Rfl:  .  Blood Glucose Calibration (ACCU-CHEK AVIVA) SOLN, , Disp: , Rfl:  .  Blood Glucose Monitoring Suppl (ACCU-CHEK AVIVA PLUS) w/Device KIT, , Disp: , Rfl:  .  butalbital-acetaminophen-caffeine (FIORICET, ESGIC) 50-325-40 MG tablet, 1 tablet every 6 (six) hours as needed. , Disp: , Rfl:  .  clotrimazole (MYCELEX) 10 MG troche, Take 10 mg  by mouth as needed., Disp: , Rfl: 3 .  Cyanocobalamin (VITAMIN B 12 PO), Take 1 tablet by mouth daily., Disp: , Rfl:  .  diphenhydrAMINE (BENADRYL) 25 mg capsule, Take by mouth., Disp: , Rfl:  .  diphenhydrAMINE (BENADRYL) 25 mg capsule, Take 25 mg by mouth as needed., Disp: , Rfl:  .  Emollient (A + D PERSONAL CARE LOTION EX), Apply 1 application topically as needed., Disp: , Rfl:  .  EPINEPHrine 0.3 mg/0.3 mL IJ SOAJ injection, Inject 0.3 mg into the skin as needed., Disp: , Rfl:  .  estradiol (ESTRACE) 0.1 MG/GM vaginal cream, , Disp: , Rfl:  .  famotidine (PEPCID) 20 MG  tablet, Take by mouth., Disp: , Rfl:  .  famotidine (PEPCID) 20 MG tablet, Take 20 mg by mouth as needed., Disp: , Rfl:  .  ferrous sulfate 324 (65 Fe) MG TBEC, Take 324 mg by mouth as needed., Disp: , Rfl:  .  fluconazole (DIFLUCAN) 150 MG tablet, Take 150 mg by mouth as needed., Disp: , Rfl: 0 .  gabapentin (NEURONTIN) 800 MG tablet, Take 2 tablets (1,600 mg total) by mouth 2 (two) times daily., Disp: 120 tablet, Rfl: 2 .  guaiFENesin-codeine 100-10 MG/5ML syrup, Take by mouth., Disp: , Rfl:  .  ipratropium-albuterol (DUONEB) 0.5-2.5 (3) MG/3ML SOLN, 3 mLs as needed. , Disp: , Rfl:  .  ketoconazole (NIZORAL) 2 % cream, APPLY AS DIRECTED TO AFFECTED AREA TWICE DAILY AS NEEDED, Disp: , Rfl:  .  Lancet Devices (SIMPLE DIAGNOSTICS LANCING DEV) MISC, Use 1 each as directed. Accu Chek Softclix. DX: E11.8, Disp: , Rfl:  .  levocetirizine (XYZAL) 5 MG tablet, Take 5 mg by mouth every evening. , Disp: , Rfl:  .  levofloxacin (LEVAQUIN) 750 MG tablet, Take 750 mg by mouth daily., Disp: , Rfl:  .  levothyroxine (SYNTHROID, LEVOTHROID) 125 MCG tablet, Take 250 mcg by mouth daily before breakfast., Disp: , Rfl:  .  Magnesium 400 MG TABS, Take 800 mg by mouth at bedtime., Disp: , Rfl:  .  meclizine (ANTIVERT) 25 MG tablet, Take 1 tablet (25 mg total) by mouth 3 (three) times daily as needed for dizziness., Disp: 30 tablet, Rfl: 0 .  medroxyPROGESTERone (DEPO-PROVERA) 150 MG/ML injection, Inject 1 mL (150 mg total) into the muscle every 3 (three) months., Disp: 1 mL, Rfl: 3 .  metFORMIN (GLUCOPHAGE) 1000 MG tablet, Take 1,000 mg by mouth 2 (two) times daily with a meal. , Disp: , Rfl:  .  metoprolol (LOPRESSOR) 50 MG tablet, Take 75 mg by mouth 2 (two) times daily. , Disp: , Rfl:  .  montelukast (SINGULAIR) 10 MG tablet, Take 10 mg by mouth at bedtime. , Disp: , Rfl:  .  Multiple Vitamin (MULTIVITAMIN WITH MINERALS) TABS tablet, Take 2 tablets by mouth daily. Reported on 11/12/2015, Disp: , Rfl:  .  naloxone  (NARCAN) nasal spray 4 mg/0.1 mL, Spray into one nostril. Repeat with second device into other nostril after 2-3 minutes if no or minimal response., Disp: 1 kit, Rfl: 0 .  omeprazole (PRILOSEC) 40 MG capsule, Take 40 mg by mouth 2 (two) times daily. , Disp: , Rfl:  .  ondansetron (ZOFRAN-ODT) 4 MG disintegrating tablet, Take 4 mg by mouth as needed., Disp: , Rfl: 0 .  OZEMPIC, 0.25 OR 0.5 MG/DOSE, 2 MG/1.5ML SOPN, Inject 2 mg into the skin once a week., Disp: , Rfl: 6 .  QUEtiapine (SEROQUEL) 100 MG tablet, Take 1 tablet (100  mg total) by mouth at bedtime., Disp: 90 tablet, Rfl: 0 .  ranitidine (ZANTAC) 150 MG capsule, Take by mouth., Disp: , Rfl:  .  rosuvastatin (CRESTOR) 40 MG tablet, Take 40 mg by mouth daily. , Disp: , Rfl:  .  sertraline (ZOLOFT) 100 MG tablet, Take 2 tablets (200 mg total) by mouth daily., Disp: 180 tablet, Rfl: 2 .  spironolactone (ALDACTONE) 25 MG tablet, Take 37.5 mg by mouth 2 (two) times daily. , Disp: , Rfl:  .  tiZANidine (ZANAFLEX) 4 MG tablet, 4 mg at bedtime. , Disp: , Rfl:  .  triamcinolone (KENALOG) 0.1 % paste, Apply 1 application topically 2 (two) times daily. , Disp: , Rfl:  .  triamcinolone (NASACORT AQ) 55 MCG/ACT AERO nasal inhaler, 2 sprays by Nasal route 2 (two) times daily., Disp: , Rfl:  .  TRUE METRIX BLOOD GLUCOSE TEST test strip, , Disp: , Rfl:  .  ULTICARE ALCOHOL SWABS PADS, Apply 1 each topically 2 (two) times daily. Use when checking BG. DX: E11.8, Disp: , Rfl:  .  vitamin C (ASCORBIC ACID) 500 MG tablet, Take 500 mg by mouth daily., Disp: , Rfl:  .  Vitamin D, Ergocalciferol, (DRISDOL) 1.25 MG (50000 UT) CAPS capsule, Take 50,000 Units by mouth 2 (two) times a week., Disp: , Rfl:  .  zolpidem (AMBIEN) 10 MG tablet, Take 10 mg by mouth at bedtime as needed for sleep., Disp: , Rfl:  .  zonisamide (ZONEGRAN) 50 MG capsule, Take 150 mg by mouth daily. 3 tabs at bedtime, Disp: , Rfl:  .  gabapentin (NEURONTIN) 800 MG tablet, Take 1 tablet (800 mg  total) by mouth every 6 (six) hours., Disp: 360 tablet, Rfl: 0 .  ketorolac (TORADOL) 10 MG tablet, Take 1 tablet (10 mg total) by mouth every 6 (six) hours as needed. (Patient not taking: Reported on 06/12/2018), Disp: 20 tablet, Rfl: 0 .  levothyroxine (SYNTHROID, LEVOTHROID) 200 MCG tablet, Take on an empty stomach each morning before breakfast. Total dose 250 mcg daily., Disp: , Rfl:  .  levothyroxine (SYNTHROID, LEVOTHROID) 50 MCG tablet, Take 50 mcg by mouth daily before breakfast. , Disp: , Rfl:  .  magnesium oxide (MAG-OX) 400 MG tablet, Take 1 tablet (400 mg total) by mouth 2 (two) times daily., Disp: 60 tablet, Rfl: 2 .  nitrofurantoin, macrocrystal-monohydrate, (MACROBID) 100 MG capsule, , Disp: , Rfl:  .  [START ON 10/23/2018] oxyCODONE (OXY IR/ROXICODONE) 5 MG immediate release tablet, Take 1 tablet (5 mg total) by mouth 5 (five) times daily as needed for severe pain., Disp: 150 tablet, Rfl: 0 .  oxyCODONE (OXY IR/ROXICODONE) 5 MG immediate release tablet, Take 1 tablet (5 mg total) by mouth 5 (five) times daily as needed for severe pain., Disp: 150 tablet, Rfl: 0 .  [START ON 09/23/2018] oxyCODONE (OXY IR/ROXICODONE) 5 MG immediate release tablet, Take 1 tablet (5 mg total) by mouth 5 (five) times daily as needed for severe pain., Disp: 150 tablet, Rfl: 0 .  phentermine 15 MG capsule, Take 37.5 mg by mouth. , Disp: , Rfl:  .  pioglitazone (ACTOS) 15 MG tablet, , Disp: , Rfl:  .  ranitidine (ZANTAC) 150 MG capsule, Take 150 mg by mouth every evening. , Disp: , Rfl:   ROS  Constitutional: Denies any fever or chills Gastrointestinal: No reported hemesis, hematochezia, vomiting, or acute GI distress Musculoskeletal: Denies any acute onset joint swelling, redness, loss of ROM, or weakness Neurological: No reported episodes of acute onset  apraxia, aphasia, dysarthria, agnosia, amnesia, paralysis, loss of coordination, or loss of consciousness  Allergies  Ms. Devivo is allergic to bactrim  [sulfamethoxazole-trimethoprim]; omalizumab; ciprofloxacin; shellfish allergy; aspirin; clindamycin; motrin [ibuprofen]; nsaids; other; and sulfa antibiotics.  [place]  Drug: Ms. Paulsen  reports no history of drug use. Alcohol:  reports no history of alcohol use. Tobacco:  reports that she has never smoked. She has never used smokeless tobacco. Medical:  has a past medical history of Anemia, Anginal pain ([place]), Anxiety, Arthralgia of hip (07/29/2015), Arthritis, Arthritis, degenerative (07/29/2015), Asthma, Cephalalgia (07/25/2014), Dependence on unknown drug ([place]), Depression, Diabetes mellitus without complication ([place]), Dysrhythmia, Eczema, Fibromyalgia, Gastritis, GERD (gastroesophageal reflux disease), Gonalgia (07/29/2015), Gout, H/O cardiovascular disorder (03/10/2015), H/O surgical procedure (12/05/2012), H/O thyroid disease (03/10/2015), Headache, Herpes, History of artificial joint (07/29/2015), History of hiatal hernia, Hypertension, Hypomagnesemia, Hypothyroidism, LBP (low back pain) (07/29/2015), Neuromuscular disorder ([place]), Obesity, PCOS (polycystic ovarian syndrome), Primary osteoarthritis of both knees (07/29/2015), Sleep apnea, and Thyroid nodule (bilateral). Surgical: Ms. Barresi  has a past surgical history that includes Laparoscopic partial gastrectomy; Shoulder arthroscopy (Right); Joint replacement (Left, hip); Carpal tunnel release (Bilateral); Diagnostic laparoscopy; Cholecystectomy; Trigger finger release (Right); Thyroidectomy (N/A, 11/12/2015); left trigger finger; Roux-en-Y Gastric Bypass (06/03/2017); Hiatal hernia repair; and peniculectomy (N/A, 07/05/2018). Family: family history includes Alcohol abuse in her father and mother; Anxiety disorder in her father and mother; Breast cancer in her paternal aunt; COPD in her father; Depression in her brother, father, and mother; Diabetes in her brother, father, and mother; Hypertension in her brother, father, and mother; Kidney cancer in her  mother; Kidney failure in her father; Post-traumatic stress disorder in her father; Sleep apnea in her brother, father, and mother.  Constitutional Exam  General appearance: Well nourished, well developed, and well hydrated. In no apparent acute distress Vitals:   08/23/18 0830  BP: 101/70  Pulse: (!) 106  Resp: 16  Temp: 98.7 F (37.1 C)  TempSrc: Oral  SpO2: 99%  Weight: (!) 344 lb (156 kg)  Height: _0  (1.6 m)  Psych/Mental status: Alert, oriented x 3 (person, place, & time)       Eyes: PERLA Respiratory: No evidence of acute respiratory distress  Lumbar Spine Area Exam  Skin & Axial Inspection: No masses, redness, or swelling Alignment: Symmetrical Functional ROM: Unrestricted ROM       Stability: No instability detected Muscle Tone/Strength: Functionally intact. No obvious neuro-muscular anomalies detected. Sensory (Neurological): Unimpaired Palpation: Complains of area being tender to palpation         Gait & Posture Assessment  Ambulation: Patient ambulates using motorized scooter Posture: WNL   Lower Extremity Exam    Side: Right lower extremity  Side: Left lower extremity  Stability: No instability observed          Stability: No instability observed          Skin & Extremity Inspection: Skin color, temperature, and hair growth are WNL. No peripheral edema or cyanosis. No masses, redness, swelling, asymmetry, or associated skin lesions. No contractures.  Skin & Extremity Inspection: Skin color, temperature, and hair growth are WNL. No peripheral edema or cyanosis. No masses, redness, swelling, asymmetry, or associated skin lesions. No contractures.  Functional ROM: Unrestricted ROM                  Functional ROM: Unrestricted ROM                  Muscle Tone/Strength: Functionally intact. No obvious neuro-muscular anomalies  detected.  Muscle Tone/Strength: Functionally intact. No obvious neuro-muscular anomalies detected.  Sensory (Neurological): Unimpaired         Sensory (Neurological): Unimpaired        Palpation: No palpable anomalies  Palpation: No palpable anomalies   Assessment  Primary Diagnosis & Pertinent Problem List: The primary encounter diagnosis was Chronic hip pain (Secondary source of pain) (Right). Diagnoses of Spondylosis without myelopathy or radiculopathy, lumbosacral region, Fibromyalgia, Neurogenic pain, Chronic pain syndrome, Hypomagnesemia, and Long term prescription opiate use were also pertinent to this visit.  Status Diagnosis  Controlled Controlled Controlled 1. Chronic hip pain (Secondary source of pain) (Right)   2. Spondylosis without myelopathy or radiculopathy, lumbosacral region   3. Fibromyalgia   4. Neurogenic pain   5. Chronic pain syndrome   6. Hypomagnesemia   7. Long term prescription opiate use     Problems updated and reviewed during this visit: No problems updated. Plan of Care  Pharmacotherapy (Medications Ordered): Meds ordered this encounter  Medications  . gabapentin (NEURONTIN) 800 MG tablet    Sig: Take 2 tablets (1,600 mg total) by mouth 2 (two) times daily.    Dispense:  120 tablet    Refill:  2    Order Specific Question:   Supervising Provider    Answer:   Milinda Pointer [phone]  . magnesium oxide (MAG-OX) 400 MG tablet    Sig: Take 1 tablet (400 mg total) by mouth 2 (two) times daily.    Dispense:  60 tablet    Refill:  2    Do not place this medication, or any other prescription from our practice, on "Automatic Refill". Patient may have prescription filled one day early if pharmacy is closed on scheduled refill date.    Order Specific Question:   Supervising Provider    Answer:   Milinda Pointer [phone]  . oxyCODONE (OXY IR/ROXICODONE) 5 MG immediate release tablet    Sig: Take 1 tablet (5 mg total) by mouth 5 (five) times daily as needed for severe pain.    Dispense:  150 tablet    Refill:  0    Fill one day early if pharmacy is closed on scheduled refill date. Do not  fill until:11/11/2017 To last until: 12/11/2017    Order Specific Question:   Supervising Provider    Answer:   Milinda Pointer [phone]  . oxyCODONE (OXY IR/ROXICODONE) 5 MG immediate release tablet    Sig: Take 1 tablet (5 mg total) by mouth 5 (five) times daily as needed for severe pain.    Dispense:  150 tablet    Refill:  0    Order Specific Question:   Supervising Provider    Answer:   Milinda Pointer [phone]  . oxyCODONE (OXY IR/ROXICODONE) 5 MG immediate release tablet    Sig: Take 1 tablet (5 mg total) by mouth 5 (five) times daily as needed for severe pain.    Dispense:  150 tablet    Refill:  0    Fill one day early if pharmacy is closed on scheduled refill date. Do not fill until: 10/12/2017 To last until:11/11/2017    Order Specific Question:   Supervising Provider    Answer:   Milinda Pointer [phone]   New Prescriptions   No medications on file   Medications administered today: Jereline A. Philipson had no medications administered during this visit. Lab-work, procedure(s), and/or referral(s): Orders Placed This Encounter  Procedures  . ToxASSURE Select 13 (MW), Urine   Imaging and/or  referral(s): None  Interventional therapies: Planned, scheduled, and/or pending: Not at the time.   Considering:  Series of 5 Hyalgan knee injections    Palliative PRN treatment(s):  Intra-articular Hyalgan knee injections.  Palliative bilateral genicular nerve block  Palliative bilateral lumbar facet block    Provider-requested follow-up: Return in about 3 months (around 11/22/2018) for MedMgmt.  Future Appointments  Date Time Provider [place]  11/16/2018  1:30 PM Karen Francois, NP [facility] None   Primary Care Physician: Karen Crouch, MD Location: [facility] Note by: Karen Francois NP Date: 08/23/2018; Time: 6:46 PM  Pain Score Disclaimer: We use the NRS-11 scale. This is a self-reported, subjective measurement of  pain severity with only modest accuracy. It is used primarily to identify changes within a particular patient. It must be understood that outpatient pain scales are significantly less accurate that those used for research, where they can be applied under ideal controlled circumstances with minimal exposure to variables. In reality, the score is likely to be a combination of pain intensity and pain affect, where pain affect describes the degree of emotional arousal or changes in action readiness caused by the sensory experience of pain. Factors such as social and work situation, setting, emotional state, anxiety levels, expectation, and prior pain experience may influence pain perception and show large inter-individual differences that may also be affected by time variables.  Patient instructions provided during this appointment: Patient Instructions   ____________________________________________________________________________________________  Medication Rules  Purpose: To inform patients, and their family members, of our rules and regulations.  Applies to: All patients receiving prescriptions (written or electronic).  Pharmacy of record: Pharmacy where electronic prescriptions will be sent. If written prescriptions are taken to a different pharmacy, please inform the nursing staff. The pharmacy listed in the electronic medical record should be the one where you would like electronic prescriptions to be sent.  Electronic prescriptions: In compliance with the [place] (STOP) Act of 2017 (Session Lanny Cramp [phone]), effective September 13, 2018, all controlled substances must be electronically prescribed. Calling prescriptions to the pharmacy will cease to exist.  Prescription refills: Only during scheduled appointments. Applies to all prescriptions.  NOTE: The following applies primarily to controlled substances (Opioid* Pain Medications).   Patient's  responsibilities: 1. Pain Pills: Bring all pain pills to every appointment (except for procedure appointments). 2. Pill Bottles: Bring pills in original pharmacy bottle. Always bring the newest bottle. Bring bottle, even if empty. 3. Medication refills: You are responsible for knowing and keeping track of what medications you take and those you need refilled. The day before your appointment: write a list of all prescriptions that need to be refilled. The day of the appointment: give the list to the admitting nurse. Prescriptions will be written only during appointments. If you forget a medication: it will not be "Called in", "Faxed", or "electronically sent". You will need to get another appointment to get these prescribed. No early refills. Do not call asking to have your prescription filled early. 4. Prescription Accuracy: You are responsible for carefully inspecting your prescriptions before leaving our office. Have the discharge nurse carefully go over each prescription with you, before taking them home. Make sure that your name is accurately spelled, that your address is correct. Check the name and dose of your medication to make sure it is accurate. Check the number of pills, and the written instructions to make sure they are clear and accurate. Make sure that  you are given enough medication to last until your next medication refill appointment. 5. Taking Medication: Take medication as prescribed. When it comes to controlled substances, taking less pills or less frequently than prescribed is permitted and encouraged. Never take more pills than instructed. Never take medication more frequently than prescribed.  6. Inform other Doctors: Always inform, all of your healthcare providers, of all the medications you take. 7. Pain Medication from other Providers: You are not allowed to accept any additional pain medication from any other Doctor or Healthcare provider. There are two exceptions to this  rule. (see below) In the event that you require additional pain medication, you are responsible for notifying us, as stated below. 8. Medication Agreement: You are responsible for carefully reading and following our Medication Agreement. This must be signed before receiving any prescriptions from our practice. Safely store a copy of your signed Agreement. Violations to the Agreement will result in no further prescriptions. (Additional copies of our Medication Agreement are available upon request.) 9. Laws, Rules, & Regulations: All patients are expected to follow all Federal and [employer], Rules, [employer]. Ignorance of the Laws does not constitute a valid excuse. The use of any illegal substances is prohibited. 10. Adopted CDC guidelines & recommendations: Target dosing levels will be at or below 60 MME/day. Use of benzodiazepines** is not recommended.  Exceptions: There are only two exceptions to the rule of not receiving pain medications from other Healthcare Providers. 1. Exception #1 (Emergencies): In the event of an emergency (i.e.: accident requiring emergency care), you are allowed to receive additional pain medication. However, you are responsible for: As soon as you are able, call our office [phone], at any time of the day or night, and leave a message stating your name, the date and nature of the emergency, and the name and dose of the medication prescribed. In the event that your call is answered by a member of our staff, make sure to document and save the date, time, and the name of the person that took your information.  2. Exception #2 (Planned Surgery): In the event that you are scheduled by another doctor or dentist to have any type of surgery or procedure, you are allowed (for a period no longer than 30 days), to receive additional pain medication, for the acute post-op pain. However, in this case, you are responsible for picking up a copy of our "Post-op Pain  Management for Surgeons" handout, and giving it to your surgeon or dentist. This document is available at our office, and does not require an appointment to obtain it. Simply go to our office during business hours (Monday-Thursday from 8:00 AM to 4:00 PM) (Friday 8:00 AM to 12:00 Noon) or if you have a scheduled appointment with [place], prior to your surgery, and ask for it by name. In addition, you will need to provide [place] with your name, name of your surgeon, type of surgery, and date of procedure or surgery.  *Opioid medications include: morphine, codeine, oxycodone, oxymorphone, hydrocodone, hydromorphone, meperidine, tramadol, tapentadol, buprenorphine, fentanyl, methadone. **Benzodiazepine medications include: diazepam (Valium), alprazolam (Xanax), clonazepam (Klonopine), lorazepam (Ativan), clorazepate (Tranxene), chlordiazepoxide (Librium), estazolam (Prosom), oxazepam (Serax), temazepam (Restoril), triazolam (Halcion) (Last updated: 11/10/2017) ____________________________________________________________________________________________   BMI Assessment: Estimated body mass index is 60.94 kg/m as calculated from the following:   Height as of this encounter: _0  (1.6 m).   Weight as of this encounter: 344 lb (156 kg).  BMI interpretation table: BMI level Category Range  association with higher incidence of chronic pain  <18 kg/m2 Underweight   18.5-24.9 kg/m2 Ideal body weight   25-29.9 kg/m2 Overweight Increased incidence by 20%  30-34.9 kg/m2 Obese (Class I) Increased incidence by 68%  35-39.9 kg/m2 Severe obesity (Class II) Increased incidence by 136%  >40 kg/m2 Extreme obesity (Class III) Increased incidence by 254%   Patient's current BMI Ideal Body weight  Body mass index is 60.94 kg/m. Ideal body weight: 52.4 kg (115 lb 8.3 oz) Adjusted ideal body weight: 93.9 kg (206 lb 14.6 oz)   BMI Readings from Last 4 Encounters:  08/23/18 60.94 kg/m  05/17/18 66.77 kg/m  04/27/18 66.09  kg/m  04/13/18 65.74 kg/m   Wt Readings from Last 4 Encounters:  08/23/18 (!) 344 lb (156 kg)  05/17/18 (!) 389 lb (176.4 kg)  04/27/18 (!) 385 lb (174.6 kg)  04/13/18 (!) 383 lb (173.7 kg)   Oxycodone 5 mg x 3 months printed and given to patient to begin filling on 08/23/18 to last until 11/22/18

## 2018-08-23 NOTE — Progress Notes (Signed)
Nursing Pain Medication Assessment:  Safety precautions to be maintained throughout the outpatient stay will include: orient to surroundings, keep bed in low position, maintain call bell within reach at all times, provide assistance with transfer out of bed and ambulation.  Medication Inspection Compliance: Pill count conducted under aseptic conditions, in front of the patient. Neither the pills nor the bottle was removed from the patient's sight at any time. Once count was completed pills were immediately returned to the patient in their original bottle.  Medication: Oxycodone IR Pill/Patch Count: 7 of 150 pills remain Pill/Patch Appearance: Markings consistent with prescribed medication Bottle Appearance: Standard pharmacy container. Clearly labeled. Filled Date: 10 / 05 / 2019 Last Medication intake:  Today

## 2018-08-24 DIAGNOSIS — E1142 Type 2 diabetes mellitus with diabetic polyneuropathy: Secondary | ICD-10-CM | POA: Diagnosis not present

## 2018-08-24 DIAGNOSIS — M793 Panniculitis, unspecified: Secondary | ICD-10-CM | POA: Diagnosis not present

## 2018-08-24 DIAGNOSIS — J45909 Unspecified asthma, uncomplicated: Secondary | ICD-10-CM | POA: Diagnosis not present

## 2018-08-24 DIAGNOSIS — M797 Fibromyalgia: Secondary | ICD-10-CM | POA: Diagnosis not present

## 2018-08-24 DIAGNOSIS — M150 Primary generalized (osteo)arthritis: Secondary | ICD-10-CM | POA: Diagnosis not present

## 2018-08-24 DIAGNOSIS — G4730 Sleep apnea, unspecified: Secondary | ICD-10-CM | POA: Diagnosis not present

## 2018-08-24 DIAGNOSIS — Z48817 Encounter for surgical aftercare following surgery on the skin and subcutaneous tissue: Secondary | ICD-10-CM | POA: Diagnosis not present

## 2018-08-24 DIAGNOSIS — Z4682 Encounter for fitting and adjustment of non-vascular catheter: Secondary | ICD-10-CM | POA: Diagnosis not present

## 2018-08-24 DIAGNOSIS — I10 Essential (primary) hypertension: Secondary | ICD-10-CM | POA: Diagnosis not present

## 2018-08-25 DIAGNOSIS — J45909 Unspecified asthma, uncomplicated: Secondary | ICD-10-CM | POA: Diagnosis not present

## 2018-08-25 DIAGNOSIS — I10 Essential (primary) hypertension: Secondary | ICD-10-CM | POA: Diagnosis not present

## 2018-08-25 DIAGNOSIS — M793 Panniculitis, unspecified: Secondary | ICD-10-CM | POA: Diagnosis not present

## 2018-08-25 DIAGNOSIS — M150 Primary generalized (osteo)arthritis: Secondary | ICD-10-CM | POA: Diagnosis not present

## 2018-08-25 DIAGNOSIS — Z48817 Encounter for surgical aftercare following surgery on the skin and subcutaneous tissue: Secondary | ICD-10-CM | POA: Diagnosis not present

## 2018-08-25 DIAGNOSIS — G4730 Sleep apnea, unspecified: Secondary | ICD-10-CM | POA: Diagnosis not present

## 2018-08-25 DIAGNOSIS — E1142 Type 2 diabetes mellitus with diabetic polyneuropathy: Secondary | ICD-10-CM | POA: Diagnosis not present

## 2018-08-25 DIAGNOSIS — M797 Fibromyalgia: Secondary | ICD-10-CM | POA: Diagnosis not present

## 2018-08-25 DIAGNOSIS — Z4682 Encounter for fitting and adjustment of non-vascular catheter: Secondary | ICD-10-CM | POA: Diagnosis not present

## 2018-08-28 ENCOUNTER — Other Ambulatory Visit (HOSPITAL_COMMUNITY)
Admission: RE | Admit: 2018-08-28 | Discharge: 2018-08-28 | Disposition: A | Discharge status: Home / Self Care | Payer: Medicare HMO | Source: Skilled Nursing Facility | Attending: Surgery | Admitting: Surgery

## 2018-08-28 DIAGNOSIS — G4730 Sleep apnea, unspecified: Secondary | ICD-10-CM | POA: Diagnosis not present

## 2018-08-28 DIAGNOSIS — M150 Primary generalized (osteo)arthritis: Secondary | ICD-10-CM | POA: Diagnosis not present

## 2018-08-28 DIAGNOSIS — M797 Fibromyalgia: Secondary | ICD-10-CM | POA: Diagnosis not present

## 2018-08-28 DIAGNOSIS — M793 Panniculitis, unspecified: Secondary | ICD-10-CM | POA: Diagnosis not present

## 2018-08-28 DIAGNOSIS — Z79899 Other long term (current) drug therapy: Secondary | ICD-10-CM | POA: Insufficient documentation

## 2018-08-28 DIAGNOSIS — E1142 Type 2 diabetes mellitus with diabetic polyneuropathy: Secondary | ICD-10-CM | POA: Diagnosis not present

## 2018-08-28 DIAGNOSIS — I10 Essential (primary) hypertension: Secondary | ICD-10-CM | POA: Diagnosis not present

## 2018-08-28 DIAGNOSIS — Z48817 Encounter for surgical aftercare following surgery on the skin and subcutaneous tissue: Secondary | ICD-10-CM | POA: Diagnosis not present

## 2018-08-28 DIAGNOSIS — J45909 Unspecified asthma, uncomplicated: Secondary | ICD-10-CM | POA: Diagnosis not present

## 2018-08-28 DIAGNOSIS — Z4682 Encounter for fitting and adjustment of non-vascular catheter: Secondary | ICD-10-CM | POA: Diagnosis not present

## 2018-08-28 LAB — CBC
HCT: 32.3 % — ABNORMAL LOW (ref 36.0–46.0)
Hemoglobin: 9.3 g/dL — ABNORMAL LOW (ref 12.0–15.0)
MCH: 21.9 pg — ABNORMAL LOW (ref 26.0–34.0)
MCHC: 28.8 g/dL — AB (ref 30.0–36.0)
MCV: 76 fL — AB (ref 80.0–100.0)
Platelets: 555 10*3/uL — ABNORMAL HIGH (ref 150–400)
RBC: 4.25 MIL/uL (ref 3.87–5.11)
RDW: 16.2 % — AB (ref 11.5–15.5)
WBC: 15.1 10*3/uL — AB (ref 4.0–10.5)
nRBC: 0 % (ref 0.0–0.2)

## 2018-08-29 LAB — TOXASSURE SELECT 13 (MW), URINE

## 2018-08-30 DIAGNOSIS — E1142 Type 2 diabetes mellitus with diabetic polyneuropathy: Secondary | ICD-10-CM | POA: Diagnosis not present

## 2018-08-30 DIAGNOSIS — Z48817 Encounter for surgical aftercare following surgery on the skin and subcutaneous tissue: Secondary | ICD-10-CM | POA: Diagnosis not present

## 2018-08-30 DIAGNOSIS — I10 Essential (primary) hypertension: Secondary | ICD-10-CM | POA: Diagnosis not present

## 2018-08-30 DIAGNOSIS — M150 Primary generalized (osteo)arthritis: Secondary | ICD-10-CM | POA: Diagnosis not present

## 2018-08-30 DIAGNOSIS — M797 Fibromyalgia: Secondary | ICD-10-CM | POA: Diagnosis not present

## 2018-08-30 DIAGNOSIS — Z4682 Encounter for fitting and adjustment of non-vascular catheter: Secondary | ICD-10-CM | POA: Diagnosis not present

## 2018-08-30 DIAGNOSIS — M793 Panniculitis, unspecified: Secondary | ICD-10-CM | POA: Diagnosis not present

## 2018-08-30 DIAGNOSIS — G4730 Sleep apnea, unspecified: Secondary | ICD-10-CM | POA: Diagnosis not present

## 2018-08-30 DIAGNOSIS — J45909 Unspecified asthma, uncomplicated: Secondary | ICD-10-CM | POA: Diagnosis not present

## 2018-09-01 DIAGNOSIS — E1142 Type 2 diabetes mellitus with diabetic polyneuropathy: Secondary | ICD-10-CM | POA: Diagnosis not present

## 2018-09-01 DIAGNOSIS — M793 Panniculitis, unspecified: Secondary | ICD-10-CM | POA: Diagnosis not present

## 2018-09-01 DIAGNOSIS — I10 Essential (primary) hypertension: Secondary | ICD-10-CM | POA: Diagnosis not present

## 2018-09-01 DIAGNOSIS — M797 Fibromyalgia: Secondary | ICD-10-CM | POA: Diagnosis not present

## 2018-09-01 DIAGNOSIS — J45909 Unspecified asthma, uncomplicated: Secondary | ICD-10-CM | POA: Diagnosis not present

## 2018-09-01 DIAGNOSIS — Z48817 Encounter for surgical aftercare following surgery on the skin and subcutaneous tissue: Secondary | ICD-10-CM | POA: Diagnosis not present

## 2018-09-01 DIAGNOSIS — G4730 Sleep apnea, unspecified: Secondary | ICD-10-CM | POA: Diagnosis not present

## 2018-09-01 DIAGNOSIS — Z4682 Encounter for fitting and adjustment of non-vascular catheter: Secondary | ICD-10-CM | POA: Diagnosis not present

## 2018-09-01 DIAGNOSIS — M150 Primary generalized (osteo)arthritis: Secondary | ICD-10-CM | POA: Diagnosis not present

## 2018-09-04 DIAGNOSIS — I10 Essential (primary) hypertension: Secondary | ICD-10-CM | POA: Diagnosis not present

## 2018-09-04 DIAGNOSIS — Z4682 Encounter for fitting and adjustment of non-vascular catheter: Secondary | ICD-10-CM | POA: Diagnosis not present

## 2018-09-04 DIAGNOSIS — M793 Panniculitis, unspecified: Secondary | ICD-10-CM | POA: Diagnosis not present

## 2018-09-04 DIAGNOSIS — M797 Fibromyalgia: Secondary | ICD-10-CM | POA: Diagnosis not present

## 2018-09-04 DIAGNOSIS — Z48817 Encounter for surgical aftercare following surgery on the skin and subcutaneous tissue: Secondary | ICD-10-CM | POA: Diagnosis not present

## 2018-09-04 DIAGNOSIS — M150 Primary generalized (osteo)arthritis: Secondary | ICD-10-CM | POA: Diagnosis not present

## 2018-09-04 DIAGNOSIS — G4730 Sleep apnea, unspecified: Secondary | ICD-10-CM | POA: Diagnosis not present

## 2018-09-04 DIAGNOSIS — J45909 Unspecified asthma, uncomplicated: Secondary | ICD-10-CM | POA: Diagnosis not present

## 2018-09-04 DIAGNOSIS — E1142 Type 2 diabetes mellitus with diabetic polyneuropathy: Secondary | ICD-10-CM | POA: Diagnosis not present

## 2018-09-07 DIAGNOSIS — G4730 Sleep apnea, unspecified: Secondary | ICD-10-CM | POA: Diagnosis not present

## 2018-09-07 DIAGNOSIS — Z48817 Encounter for surgical aftercare following surgery on the skin and subcutaneous tissue: Secondary | ICD-10-CM | POA: Diagnosis not present

## 2018-09-07 DIAGNOSIS — E1142 Type 2 diabetes mellitus with diabetic polyneuropathy: Secondary | ICD-10-CM | POA: Diagnosis not present

## 2018-09-07 DIAGNOSIS — M797 Fibromyalgia: Secondary | ICD-10-CM | POA: Diagnosis not present

## 2018-09-07 DIAGNOSIS — J45909 Unspecified asthma, uncomplicated: Secondary | ICD-10-CM | POA: Diagnosis not present

## 2018-09-07 DIAGNOSIS — Z4682 Encounter for fitting and adjustment of non-vascular catheter: Secondary | ICD-10-CM | POA: Diagnosis not present

## 2018-09-07 DIAGNOSIS — M793 Panniculitis, unspecified: Secondary | ICD-10-CM | POA: Diagnosis not present

## 2018-09-07 DIAGNOSIS — M150 Primary generalized (osteo)arthritis: Secondary | ICD-10-CM | POA: Diagnosis not present

## 2018-09-07 DIAGNOSIS — I10 Essential (primary) hypertension: Secondary | ICD-10-CM | POA: Diagnosis not present

## 2018-09-08 DIAGNOSIS — M797 Fibromyalgia: Secondary | ICD-10-CM | POA: Diagnosis not present

## 2018-09-08 DIAGNOSIS — Z48817 Encounter for surgical aftercare following surgery on the skin and subcutaneous tissue: Secondary | ICD-10-CM | POA: Diagnosis not present

## 2018-09-08 DIAGNOSIS — I10 Essential (primary) hypertension: Secondary | ICD-10-CM | POA: Diagnosis not present

## 2018-09-08 DIAGNOSIS — J45909 Unspecified asthma, uncomplicated: Secondary | ICD-10-CM | POA: Diagnosis not present

## 2018-09-08 DIAGNOSIS — M793 Panniculitis, unspecified: Secondary | ICD-10-CM | POA: Diagnosis not present

## 2018-09-08 DIAGNOSIS — E1142 Type 2 diabetes mellitus with diabetic polyneuropathy: Secondary | ICD-10-CM | POA: Diagnosis not present

## 2018-09-08 DIAGNOSIS — M150 Primary generalized (osteo)arthritis: Secondary | ICD-10-CM | POA: Diagnosis not present

## 2018-09-08 DIAGNOSIS — Z4682 Encounter for fitting and adjustment of non-vascular catheter: Secondary | ICD-10-CM | POA: Diagnosis not present

## 2018-09-08 DIAGNOSIS — G4730 Sleep apnea, unspecified: Secondary | ICD-10-CM | POA: Diagnosis not present

## 2018-09-11 DIAGNOSIS — M797 Fibromyalgia: Secondary | ICD-10-CM | POA: Diagnosis not present

## 2018-09-11 DIAGNOSIS — M793 Panniculitis, unspecified: Secondary | ICD-10-CM | POA: Diagnosis not present

## 2018-09-11 DIAGNOSIS — E1142 Type 2 diabetes mellitus with diabetic polyneuropathy: Secondary | ICD-10-CM | POA: Diagnosis not present

## 2018-09-11 DIAGNOSIS — G4730 Sleep apnea, unspecified: Secondary | ICD-10-CM | POA: Diagnosis not present

## 2018-09-11 DIAGNOSIS — J45909 Unspecified asthma, uncomplicated: Secondary | ICD-10-CM | POA: Diagnosis not present

## 2018-09-11 DIAGNOSIS — I10 Essential (primary) hypertension: Secondary | ICD-10-CM | POA: Diagnosis not present

## 2018-09-11 DIAGNOSIS — Z48817 Encounter for surgical aftercare following surgery on the skin and subcutaneous tissue: Secondary | ICD-10-CM | POA: Diagnosis not present

## 2018-09-11 DIAGNOSIS — M150 Primary generalized (osteo)arthritis: Secondary | ICD-10-CM | POA: Diagnosis not present

## 2018-09-13 DIAGNOSIS — M797 Fibromyalgia: Secondary | ICD-10-CM | POA: Diagnosis not present

## 2018-09-13 DIAGNOSIS — I10 Essential (primary) hypertension: Secondary | ICD-10-CM | POA: Diagnosis not present

## 2018-09-13 DIAGNOSIS — M793 Panniculitis, unspecified: Secondary | ICD-10-CM | POA: Diagnosis not present

## 2018-09-13 DIAGNOSIS — J45909 Unspecified asthma, uncomplicated: Secondary | ICD-10-CM | POA: Diagnosis not present

## 2018-09-13 DIAGNOSIS — Z48817 Encounter for surgical aftercare following surgery on the skin and subcutaneous tissue: Secondary | ICD-10-CM | POA: Diagnosis not present

## 2018-09-13 DIAGNOSIS — M150 Primary generalized (osteo)arthritis: Secondary | ICD-10-CM | POA: Diagnosis not present

## 2018-09-13 DIAGNOSIS — G4730 Sleep apnea, unspecified: Secondary | ICD-10-CM | POA: Diagnosis not present

## 2018-09-13 DIAGNOSIS — E1142 Type 2 diabetes mellitus with diabetic polyneuropathy: Secondary | ICD-10-CM | POA: Diagnosis not present

## 2018-09-16 DIAGNOSIS — M797 Fibromyalgia: Secondary | ICD-10-CM | POA: Diagnosis not present

## 2018-09-16 DIAGNOSIS — E1142 Type 2 diabetes mellitus with diabetic polyneuropathy: Secondary | ICD-10-CM | POA: Diagnosis not present

## 2018-09-16 DIAGNOSIS — M150 Primary generalized (osteo)arthritis: Secondary | ICD-10-CM | POA: Diagnosis not present

## 2018-09-16 DIAGNOSIS — I10 Essential (primary) hypertension: Secondary | ICD-10-CM | POA: Diagnosis not present

## 2018-09-16 DIAGNOSIS — Z48817 Encounter for surgical aftercare following surgery on the skin and subcutaneous tissue: Secondary | ICD-10-CM | POA: Diagnosis not present

## 2018-09-16 DIAGNOSIS — M793 Panniculitis, unspecified: Secondary | ICD-10-CM | POA: Diagnosis not present

## 2018-09-16 DIAGNOSIS — G4730 Sleep apnea, unspecified: Secondary | ICD-10-CM | POA: Diagnosis not present

## 2018-09-16 DIAGNOSIS — J45909 Unspecified asthma, uncomplicated: Secondary | ICD-10-CM | POA: Diagnosis not present

## 2018-09-18 DIAGNOSIS — J45909 Unspecified asthma, uncomplicated: Secondary | ICD-10-CM | POA: Diagnosis not present

## 2018-09-18 DIAGNOSIS — Z48817 Encounter for surgical aftercare following surgery on the skin and subcutaneous tissue: Secondary | ICD-10-CM | POA: Diagnosis not present

## 2018-09-18 DIAGNOSIS — M797 Fibromyalgia: Secondary | ICD-10-CM | POA: Diagnosis not present

## 2018-09-18 DIAGNOSIS — I10 Essential (primary) hypertension: Secondary | ICD-10-CM | POA: Diagnosis not present

## 2018-09-18 DIAGNOSIS — E1142 Type 2 diabetes mellitus with diabetic polyneuropathy: Secondary | ICD-10-CM | POA: Diagnosis not present

## 2018-09-18 DIAGNOSIS — M793 Panniculitis, unspecified: Secondary | ICD-10-CM | POA: Diagnosis not present

## 2018-09-18 DIAGNOSIS — M150 Primary generalized (osteo)arthritis: Secondary | ICD-10-CM | POA: Diagnosis not present

## 2018-09-18 DIAGNOSIS — G4730 Sleep apnea, unspecified: Secondary | ICD-10-CM | POA: Diagnosis not present

## 2018-09-20 DIAGNOSIS — E1142 Type 2 diabetes mellitus with diabetic polyneuropathy: Secondary | ICD-10-CM | POA: Diagnosis not present

## 2018-09-20 DIAGNOSIS — Z48817 Encounter for surgical aftercare following surgery on the skin and subcutaneous tissue: Secondary | ICD-10-CM | POA: Diagnosis not present

## 2018-09-20 DIAGNOSIS — M797 Fibromyalgia: Secondary | ICD-10-CM | POA: Diagnosis not present

## 2018-09-20 DIAGNOSIS — I10 Essential (primary) hypertension: Secondary | ICD-10-CM | POA: Diagnosis not present

## 2018-09-20 DIAGNOSIS — J45909 Unspecified asthma, uncomplicated: Secondary | ICD-10-CM | POA: Diagnosis not present

## 2018-09-20 DIAGNOSIS — M793 Panniculitis, unspecified: Secondary | ICD-10-CM | POA: Diagnosis not present

## 2018-09-20 DIAGNOSIS — G4730 Sleep apnea, unspecified: Secondary | ICD-10-CM | POA: Diagnosis not present

## 2018-09-20 DIAGNOSIS — M150 Primary generalized (osteo)arthritis: Secondary | ICD-10-CM | POA: Diagnosis not present

## 2018-09-25 DIAGNOSIS — E1142 Type 2 diabetes mellitus with diabetic polyneuropathy: Secondary | ICD-10-CM | POA: Diagnosis not present

## 2018-09-25 DIAGNOSIS — J45909 Unspecified asthma, uncomplicated: Secondary | ICD-10-CM | POA: Diagnosis not present

## 2018-09-25 DIAGNOSIS — G4730 Sleep apnea, unspecified: Secondary | ICD-10-CM | POA: Diagnosis not present

## 2018-09-25 DIAGNOSIS — I10 Essential (primary) hypertension: Secondary | ICD-10-CM | POA: Diagnosis not present

## 2018-09-25 DIAGNOSIS — M797 Fibromyalgia: Secondary | ICD-10-CM | POA: Diagnosis not present

## 2018-09-25 DIAGNOSIS — Z48817 Encounter for surgical aftercare following surgery on the skin and subcutaneous tissue: Secondary | ICD-10-CM | POA: Diagnosis not present

## 2018-09-25 DIAGNOSIS — M150 Primary generalized (osteo)arthritis: Secondary | ICD-10-CM | POA: Diagnosis not present

## 2018-09-25 DIAGNOSIS — M793 Panniculitis, unspecified: Secondary | ICD-10-CM | POA: Diagnosis not present

## 2018-09-27 DIAGNOSIS — T8189XA Other complications of procedures, not elsewhere classified, initial encounter: Secondary | ICD-10-CM | POA: Insufficient documentation

## 2018-09-30 DIAGNOSIS — M150 Primary generalized (osteo)arthritis: Secondary | ICD-10-CM | POA: Diagnosis not present

## 2018-09-30 DIAGNOSIS — J45909 Unspecified asthma, uncomplicated: Secondary | ICD-10-CM | POA: Diagnosis not present

## 2018-09-30 DIAGNOSIS — G4730 Sleep apnea, unspecified: Secondary | ICD-10-CM | POA: Diagnosis not present

## 2018-09-30 DIAGNOSIS — M793 Panniculitis, unspecified: Secondary | ICD-10-CM | POA: Diagnosis not present

## 2018-09-30 DIAGNOSIS — E1142 Type 2 diabetes mellitus with diabetic polyneuropathy: Secondary | ICD-10-CM | POA: Diagnosis not present

## 2018-09-30 DIAGNOSIS — I10 Essential (primary) hypertension: Secondary | ICD-10-CM | POA: Diagnosis not present

## 2018-09-30 DIAGNOSIS — M797 Fibromyalgia: Secondary | ICD-10-CM | POA: Diagnosis not present

## 2018-09-30 DIAGNOSIS — Z48817 Encounter for surgical aftercare following surgery on the skin and subcutaneous tissue: Secondary | ICD-10-CM | POA: Diagnosis not present

## 2018-10-04 DIAGNOSIS — Z48817 Encounter for surgical aftercare following surgery on the skin and subcutaneous tissue: Secondary | ICD-10-CM | POA: Diagnosis not present

## 2018-10-04 DIAGNOSIS — M797 Fibromyalgia: Secondary | ICD-10-CM | POA: Diagnosis not present

## 2018-10-04 DIAGNOSIS — J45909 Unspecified asthma, uncomplicated: Secondary | ICD-10-CM | POA: Diagnosis not present

## 2018-10-04 DIAGNOSIS — I10 Essential (primary) hypertension: Secondary | ICD-10-CM | POA: Diagnosis not present

## 2018-10-04 DIAGNOSIS — G4730 Sleep apnea, unspecified: Secondary | ICD-10-CM | POA: Diagnosis not present

## 2018-10-04 DIAGNOSIS — M793 Panniculitis, unspecified: Secondary | ICD-10-CM | POA: Diagnosis not present

## 2018-10-04 DIAGNOSIS — M150 Primary generalized (osteo)arthritis: Secondary | ICD-10-CM | POA: Diagnosis not present

## 2018-10-04 DIAGNOSIS — E1142 Type 2 diabetes mellitus with diabetic polyneuropathy: Secondary | ICD-10-CM | POA: Diagnosis not present

## 2018-10-05 DIAGNOSIS — I10 Essential (primary) hypertension: Secondary | ICD-10-CM | POA: Diagnosis not present

## 2018-10-05 DIAGNOSIS — M793 Panniculitis, unspecified: Secondary | ICD-10-CM | POA: Diagnosis not present

## 2018-10-05 DIAGNOSIS — E1142 Type 2 diabetes mellitus with diabetic polyneuropathy: Secondary | ICD-10-CM | POA: Diagnosis not present

## 2018-10-05 DIAGNOSIS — Z48817 Encounter for surgical aftercare following surgery on the skin and subcutaneous tissue: Secondary | ICD-10-CM | POA: Diagnosis not present

## 2018-10-05 DIAGNOSIS — J45909 Unspecified asthma, uncomplicated: Secondary | ICD-10-CM | POA: Diagnosis not present

## 2018-10-05 DIAGNOSIS — M150 Primary generalized (osteo)arthritis: Secondary | ICD-10-CM | POA: Diagnosis not present

## 2018-10-05 DIAGNOSIS — G4730 Sleep apnea, unspecified: Secondary | ICD-10-CM | POA: Diagnosis not present

## 2018-10-05 DIAGNOSIS — M797 Fibromyalgia: Secondary | ICD-10-CM | POA: Diagnosis not present

## 2018-10-06 DIAGNOSIS — Z48817 Encounter for surgical aftercare following surgery on the skin and subcutaneous tissue: Secondary | ICD-10-CM | POA: Diagnosis not present

## 2018-10-06 DIAGNOSIS — M797 Fibromyalgia: Secondary | ICD-10-CM | POA: Diagnosis not present

## 2018-10-06 DIAGNOSIS — G4730 Sleep apnea, unspecified: Secondary | ICD-10-CM | POA: Diagnosis not present

## 2018-10-06 DIAGNOSIS — M793 Panniculitis, unspecified: Secondary | ICD-10-CM | POA: Diagnosis not present

## 2018-10-06 DIAGNOSIS — E1142 Type 2 diabetes mellitus with diabetic polyneuropathy: Secondary | ICD-10-CM | POA: Diagnosis not present

## 2018-10-06 DIAGNOSIS — M150 Primary generalized (osteo)arthritis: Secondary | ICD-10-CM | POA: Diagnosis not present

## 2018-10-06 DIAGNOSIS — I10 Essential (primary) hypertension: Secondary | ICD-10-CM | POA: Diagnosis not present

## 2018-10-06 DIAGNOSIS — J45909 Unspecified asthma, uncomplicated: Secondary | ICD-10-CM | POA: Diagnosis not present

## 2018-10-09 DIAGNOSIS — M793 Panniculitis, unspecified: Secondary | ICD-10-CM | POA: Diagnosis not present

## 2018-10-09 DIAGNOSIS — G4730 Sleep apnea, unspecified: Secondary | ICD-10-CM | POA: Diagnosis not present

## 2018-10-09 DIAGNOSIS — M150 Primary generalized (osteo)arthritis: Secondary | ICD-10-CM | POA: Diagnosis not present

## 2018-10-09 DIAGNOSIS — Z48817 Encounter for surgical aftercare following surgery on the skin and subcutaneous tissue: Secondary | ICD-10-CM | POA: Diagnosis not present

## 2018-10-09 DIAGNOSIS — I10 Essential (primary) hypertension: Secondary | ICD-10-CM | POA: Diagnosis not present

## 2018-10-09 DIAGNOSIS — J45909 Unspecified asthma, uncomplicated: Secondary | ICD-10-CM | POA: Diagnosis not present

## 2018-10-09 DIAGNOSIS — E1142 Type 2 diabetes mellitus with diabetic polyneuropathy: Secondary | ICD-10-CM | POA: Diagnosis not present

## 2018-10-09 DIAGNOSIS — M797 Fibromyalgia: Secondary | ICD-10-CM | POA: Diagnosis not present

## 2018-10-11 DIAGNOSIS — G4730 Sleep apnea, unspecified: Secondary | ICD-10-CM | POA: Diagnosis not present

## 2018-10-11 DIAGNOSIS — E1142 Type 2 diabetes mellitus with diabetic polyneuropathy: Secondary | ICD-10-CM | POA: Diagnosis not present

## 2018-10-11 DIAGNOSIS — M797 Fibromyalgia: Secondary | ICD-10-CM | POA: Diagnosis not present

## 2018-10-11 DIAGNOSIS — Z48817 Encounter for surgical aftercare following surgery on the skin and subcutaneous tissue: Secondary | ICD-10-CM | POA: Diagnosis not present

## 2018-10-11 DIAGNOSIS — M150 Primary generalized (osteo)arthritis: Secondary | ICD-10-CM | POA: Diagnosis not present

## 2018-10-11 DIAGNOSIS — I10 Essential (primary) hypertension: Secondary | ICD-10-CM | POA: Diagnosis not present

## 2018-10-11 DIAGNOSIS — J45909 Unspecified asthma, uncomplicated: Secondary | ICD-10-CM | POA: Diagnosis not present

## 2018-10-11 DIAGNOSIS — M793 Panniculitis, unspecified: Secondary | ICD-10-CM | POA: Diagnosis not present

## 2018-10-13 DIAGNOSIS — J45909 Unspecified asthma, uncomplicated: Secondary | ICD-10-CM | POA: Diagnosis not present

## 2018-10-13 DIAGNOSIS — M797 Fibromyalgia: Secondary | ICD-10-CM | POA: Diagnosis not present

## 2018-10-13 DIAGNOSIS — I10 Essential (primary) hypertension: Secondary | ICD-10-CM | POA: Diagnosis not present

## 2018-10-13 DIAGNOSIS — M793 Panniculitis, unspecified: Secondary | ICD-10-CM | POA: Diagnosis not present

## 2018-10-13 DIAGNOSIS — M150 Primary generalized (osteo)arthritis: Secondary | ICD-10-CM | POA: Diagnosis not present

## 2018-10-13 DIAGNOSIS — Z48817 Encounter for surgical aftercare following surgery on the skin and subcutaneous tissue: Secondary | ICD-10-CM | POA: Diagnosis not present

## 2018-10-13 DIAGNOSIS — E1142 Type 2 diabetes mellitus with diabetic polyneuropathy: Secondary | ICD-10-CM | POA: Diagnosis not present

## 2018-10-13 DIAGNOSIS — G4730 Sleep apnea, unspecified: Secondary | ICD-10-CM | POA: Diagnosis not present

## 2018-10-17 DIAGNOSIS — Z48817 Encounter for surgical aftercare following surgery on the skin and subcutaneous tissue: Secondary | ICD-10-CM | POA: Diagnosis not present

## 2018-10-17 DIAGNOSIS — M150 Primary generalized (osteo)arthritis: Secondary | ICD-10-CM | POA: Diagnosis not present

## 2018-10-17 DIAGNOSIS — J45909 Unspecified asthma, uncomplicated: Secondary | ICD-10-CM | POA: Diagnosis not present

## 2018-10-17 DIAGNOSIS — E1142 Type 2 diabetes mellitus with diabetic polyneuropathy: Secondary | ICD-10-CM | POA: Diagnosis not present

## 2018-10-17 DIAGNOSIS — I10 Essential (primary) hypertension: Secondary | ICD-10-CM | POA: Diagnosis not present

## 2018-10-17 DIAGNOSIS — M797 Fibromyalgia: Secondary | ICD-10-CM | POA: Diagnosis not present

## 2018-10-17 DIAGNOSIS — M793 Panniculitis, unspecified: Secondary | ICD-10-CM | POA: Diagnosis not present

## 2018-10-17 DIAGNOSIS — G4730 Sleep apnea, unspecified: Secondary | ICD-10-CM | POA: Diagnosis not present

## 2018-10-18 DIAGNOSIS — M150 Primary generalized (osteo)arthritis: Secondary | ICD-10-CM | POA: Diagnosis not present

## 2018-10-18 DIAGNOSIS — Z48817 Encounter for surgical aftercare following surgery on the skin and subcutaneous tissue: Secondary | ICD-10-CM | POA: Diagnosis not present

## 2018-10-18 DIAGNOSIS — M797 Fibromyalgia: Secondary | ICD-10-CM | POA: Diagnosis not present

## 2018-10-18 DIAGNOSIS — M793 Panniculitis, unspecified: Secondary | ICD-10-CM | POA: Diagnosis not present

## 2018-10-18 DIAGNOSIS — E1142 Type 2 diabetes mellitus with diabetic polyneuropathy: Secondary | ICD-10-CM | POA: Diagnosis not present

## 2018-10-18 DIAGNOSIS — J45909 Unspecified asthma, uncomplicated: Secondary | ICD-10-CM | POA: Diagnosis not present

## 2018-10-18 DIAGNOSIS — G4730 Sleep apnea, unspecified: Secondary | ICD-10-CM | POA: Diagnosis not present

## 2018-10-18 DIAGNOSIS — I10 Essential (primary) hypertension: Secondary | ICD-10-CM | POA: Diagnosis not present

## 2018-10-20 DIAGNOSIS — M150 Primary generalized (osteo)arthritis: Secondary | ICD-10-CM | POA: Diagnosis not present

## 2018-10-20 DIAGNOSIS — I10 Essential (primary) hypertension: Secondary | ICD-10-CM | POA: Diagnosis not present

## 2018-10-20 DIAGNOSIS — G4730 Sleep apnea, unspecified: Secondary | ICD-10-CM | POA: Diagnosis not present

## 2018-10-20 DIAGNOSIS — Z48817 Encounter for surgical aftercare following surgery on the skin and subcutaneous tissue: Secondary | ICD-10-CM | POA: Diagnosis not present

## 2018-10-20 DIAGNOSIS — M793 Panniculitis, unspecified: Secondary | ICD-10-CM | POA: Diagnosis not present

## 2018-10-20 DIAGNOSIS — E1142 Type 2 diabetes mellitus with diabetic polyneuropathy: Secondary | ICD-10-CM | POA: Diagnosis not present

## 2018-10-20 DIAGNOSIS — M797 Fibromyalgia: Secondary | ICD-10-CM | POA: Diagnosis not present

## 2018-10-20 DIAGNOSIS — J45909 Unspecified asthma, uncomplicated: Secondary | ICD-10-CM | POA: Diagnosis not present

## 2018-10-24 DIAGNOSIS — M793 Panniculitis, unspecified: Secondary | ICD-10-CM | POA: Diagnosis not present

## 2018-10-24 DIAGNOSIS — I10 Essential (primary) hypertension: Secondary | ICD-10-CM | POA: Diagnosis not present

## 2018-10-24 DIAGNOSIS — G4730 Sleep apnea, unspecified: Secondary | ICD-10-CM | POA: Diagnosis not present

## 2018-10-24 DIAGNOSIS — Z48817 Encounter for surgical aftercare following surgery on the skin and subcutaneous tissue: Secondary | ICD-10-CM | POA: Diagnosis not present

## 2018-10-24 DIAGNOSIS — M797 Fibromyalgia: Secondary | ICD-10-CM | POA: Diagnosis not present

## 2018-10-24 DIAGNOSIS — J45909 Unspecified asthma, uncomplicated: Secondary | ICD-10-CM | POA: Diagnosis not present

## 2018-10-24 DIAGNOSIS — E1142 Type 2 diabetes mellitus with diabetic polyneuropathy: Secondary | ICD-10-CM | POA: Diagnosis not present

## 2018-10-24 DIAGNOSIS — M150 Primary generalized (osteo)arthritis: Secondary | ICD-10-CM | POA: Diagnosis not present

## 2018-10-26 DIAGNOSIS — E1142 Type 2 diabetes mellitus with diabetic polyneuropathy: Secondary | ICD-10-CM | POA: Diagnosis not present

## 2018-10-26 DIAGNOSIS — M150 Primary generalized (osteo)arthritis: Secondary | ICD-10-CM | POA: Diagnosis not present

## 2018-10-26 DIAGNOSIS — Z48817 Encounter for surgical aftercare following surgery on the skin and subcutaneous tissue: Secondary | ICD-10-CM | POA: Diagnosis not present

## 2018-10-26 DIAGNOSIS — J45909 Unspecified asthma, uncomplicated: Secondary | ICD-10-CM | POA: Diagnosis not present

## 2018-10-26 DIAGNOSIS — M793 Panniculitis, unspecified: Secondary | ICD-10-CM | POA: Diagnosis not present

## 2018-10-26 DIAGNOSIS — I10 Essential (primary) hypertension: Secondary | ICD-10-CM | POA: Diagnosis not present

## 2018-10-26 DIAGNOSIS — G4730 Sleep apnea, unspecified: Secondary | ICD-10-CM | POA: Diagnosis not present

## 2018-10-26 DIAGNOSIS — M797 Fibromyalgia: Secondary | ICD-10-CM | POA: Diagnosis not present

## 2018-10-31 DIAGNOSIS — E1142 Type 2 diabetes mellitus with diabetic polyneuropathy: Secondary | ICD-10-CM | POA: Diagnosis not present

## 2018-10-31 DIAGNOSIS — J45909 Unspecified asthma, uncomplicated: Secondary | ICD-10-CM | POA: Diagnosis not present

## 2018-10-31 DIAGNOSIS — M797 Fibromyalgia: Secondary | ICD-10-CM | POA: Diagnosis not present

## 2018-10-31 DIAGNOSIS — M150 Primary generalized (osteo)arthritis: Secondary | ICD-10-CM | POA: Diagnosis not present

## 2018-10-31 DIAGNOSIS — G4730 Sleep apnea, unspecified: Secondary | ICD-10-CM | POA: Diagnosis not present

## 2018-10-31 DIAGNOSIS — M793 Panniculitis, unspecified: Secondary | ICD-10-CM | POA: Diagnosis not present

## 2018-10-31 DIAGNOSIS — I10 Essential (primary) hypertension: Secondary | ICD-10-CM | POA: Diagnosis not present

## 2018-10-31 DIAGNOSIS — Z48817 Encounter for surgical aftercare following surgery on the skin and subcutaneous tissue: Secondary | ICD-10-CM | POA: Diagnosis not present

## 2018-11-02 DIAGNOSIS — M793 Panniculitis, unspecified: Secondary | ICD-10-CM | POA: Diagnosis not present

## 2018-11-02 DIAGNOSIS — G4730 Sleep apnea, unspecified: Secondary | ICD-10-CM | POA: Diagnosis not present

## 2018-11-02 DIAGNOSIS — Z48817 Encounter for surgical aftercare following surgery on the skin and subcutaneous tissue: Secondary | ICD-10-CM | POA: Diagnosis not present

## 2018-11-02 DIAGNOSIS — E1142 Type 2 diabetes mellitus with diabetic polyneuropathy: Secondary | ICD-10-CM | POA: Diagnosis not present

## 2018-11-02 DIAGNOSIS — J45909 Unspecified asthma, uncomplicated: Secondary | ICD-10-CM | POA: Diagnosis not present

## 2018-11-02 DIAGNOSIS — M150 Primary generalized (osteo)arthritis: Secondary | ICD-10-CM | POA: Diagnosis not present

## 2018-11-02 DIAGNOSIS — I10 Essential (primary) hypertension: Secondary | ICD-10-CM | POA: Diagnosis not present

## 2018-11-02 DIAGNOSIS — M797 Fibromyalgia: Secondary | ICD-10-CM | POA: Diagnosis not present

## 2018-11-03 DIAGNOSIS — M150 Primary generalized (osteo)arthritis: Secondary | ICD-10-CM | POA: Diagnosis not present

## 2018-11-03 DIAGNOSIS — J45909 Unspecified asthma, uncomplicated: Secondary | ICD-10-CM | POA: Diagnosis not present

## 2018-11-03 DIAGNOSIS — I10 Essential (primary) hypertension: Secondary | ICD-10-CM | POA: Diagnosis not present

## 2018-11-03 DIAGNOSIS — M793 Panniculitis, unspecified: Secondary | ICD-10-CM | POA: Diagnosis not present

## 2018-11-03 DIAGNOSIS — E1142 Type 2 diabetes mellitus with diabetic polyneuropathy: Secondary | ICD-10-CM | POA: Diagnosis not present

## 2018-11-03 DIAGNOSIS — Z48817 Encounter for surgical aftercare following surgery on the skin and subcutaneous tissue: Secondary | ICD-10-CM | POA: Diagnosis not present

## 2018-11-03 DIAGNOSIS — M797 Fibromyalgia: Secondary | ICD-10-CM | POA: Diagnosis not present

## 2018-11-03 DIAGNOSIS — G4730 Sleep apnea, unspecified: Secondary | ICD-10-CM | POA: Diagnosis not present

## 2018-11-08 ENCOUNTER — Other Ambulatory Visit: Payer: Self-pay | Admitting: Internal Medicine

## 2018-11-08 DIAGNOSIS — R1030 Lower abdominal pain, unspecified: Secondary | ICD-10-CM

## 2018-11-09 ENCOUNTER — Ambulatory Visit (HOSPITAL_COMMUNITY)
Admission: RE | Admit: 2018-11-09 | Discharge: 2018-11-09 | Disposition: A | Discharge status: Home / Self Care | Payer: Medicare HMO | Source: Ambulatory Visit | Attending: Internal Medicine | Admitting: Internal Medicine

## 2018-11-09 DIAGNOSIS — R1030 Lower abdominal pain, unspecified: Secondary | ICD-10-CM | POA: Diagnosis present

## 2018-11-09 MED ORDER — IOHEXOL 300 MG/ML  SOLN
125.0000 mL | Freq: Once | INTRAMUSCULAR | Status: AC | PRN
  Administered 2018-11-09: 125 mL via INTRAVENOUS

## 2018-11-16 ENCOUNTER — Encounter: Payer: Medicare HMO | Admitting: Nurse Practitioner

## 2019-01-08 ENCOUNTER — Other Ambulatory Visit (HOSPITAL_COMMUNITY): Payer: Self-pay | Admitting: Surgery

## 2019-01-08 ENCOUNTER — Other Ambulatory Visit: Payer: Self-pay | Admitting: Surgery

## 2019-01-08 DIAGNOSIS — G8918 Other acute postprocedural pain: Secondary | ICD-10-CM

## 2019-01-10 ENCOUNTER — Ambulatory Visit (HOSPITAL_COMMUNITY)
Admission: RE | Admit: 2019-01-10 | Discharge: 2019-01-10 | Disposition: A | Discharge status: Home / Self Care | Payer: Medicare HMO | Source: Ambulatory Visit | Attending: Surgery | Admitting: Surgery

## 2019-01-10 ENCOUNTER — Telehealth: Payer: Self-pay | Admitting: Nurse Practitioner

## 2019-01-10 ENCOUNTER — Ambulatory Visit: Payer: Medicare HMO | Attending: Nurse Practitioner | Admitting: Nurse Practitioner

## 2019-01-10 ENCOUNTER — Other Ambulatory Visit: Payer: Self-pay

## 2019-01-10 DIAGNOSIS — Z9884 Bariatric surgery status: Secondary | ICD-10-CM | POA: Diagnosis not present

## 2019-01-10 DIAGNOSIS — G894 Chronic pain syndrome: Secondary | ICD-10-CM | POA: Diagnosis not present

## 2019-01-10 DIAGNOSIS — M25561 Pain in right knee: Secondary | ICD-10-CM

## 2019-01-10 DIAGNOSIS — G8918 Other acute postprocedural pain: Secondary | ICD-10-CM | POA: Diagnosis present

## 2019-01-10 DIAGNOSIS — G8929 Other chronic pain: Secondary | ICD-10-CM

## 2019-01-10 DIAGNOSIS — M25562 Pain in left knee: Secondary | ICD-10-CM

## 2019-01-10 DIAGNOSIS — M797 Fibromyalgia: Secondary | ICD-10-CM | POA: Diagnosis not present

## 2019-01-10 DIAGNOSIS — M792 Neuralgia and neuritis, unspecified: Secondary | ICD-10-CM

## 2019-01-10 DIAGNOSIS — K439 Ventral hernia without obstruction or gangrene: Secondary | ICD-10-CM | POA: Diagnosis not present

## 2019-01-10 DIAGNOSIS — M47816 Spondylosis without myelopathy or radiculopathy, lumbar region: Secondary | ICD-10-CM

## 2019-01-10 DIAGNOSIS — M25551 Pain in right hip: Secondary | ICD-10-CM

## 2019-01-10 LAB — POCT I-STAT CREATININE: Creatinine, Ser: 0.8 mg/dL (ref 0.44–1.00)

## 2019-01-10 MED ORDER — OXYCODONE HCL 5 MG PO TABS: 5.0000 mg | ORAL_TABLET | Freq: Every day | ORAL | 0 refills | Status: DC | PRN

## 2019-01-10 MED ORDER — IOHEXOL 300 MG/ML  SOLN
100.0000 mL | Freq: Once | INTRAMUSCULAR | Status: AC | PRN
  Administered 2019-01-10: 100 mL via INTRAVENOUS

## 2019-01-10 MED ORDER — MAGNESIUM OXIDE 400 MG PO TABS: 400.0000 mg | ORAL_TABLET | Freq: Two times a day (BID) | ORAL | 2 refills | Status: DC

## 2019-01-10 MED ORDER — PREGABALIN 25 MG PO CAPS: 25.0000 mg | ORAL_CAPSULE | Freq: Three times a day (TID) | ORAL | 0 refills | Status: DC

## 2019-01-10 MED ORDER — GABAPENTIN 800 MG PO TABS: 800.0000 mg | ORAL_TABLET | Freq: Four times a day (QID) | ORAL | 0 refills | Status: DC

## 2019-01-10 MED ORDER — GABAPENTIN 800 MG PO TABS: 1600.0000 mg | ORAL_TABLET | Freq: Two times a day (BID) | ORAL | 2 refills | Status: DC

## 2019-01-10 NOTE — Telephone Encounter (Signed)
Pt states that she spoke with Karen Lewis earlier and had her vv and that she has not sent in her prescriptions yet. I informed pt that Karen Lewis has been having issues with her computer but pt wanted me to put a note in just in case she forgot. Thank you.

## 2019-01-10 NOTE — Progress Notes (Signed)
Pain Management Encounter Note - Virtual Visit via Telephone Telehealth (real-time audio visits between healthcare provider and patient).  Patient's Phone No. & Preferred Pharmacy:  [phone] (home); [phone] (mobile); (Preferred) [phone]  [place], [place] - [address] [place] [address] Phone: [phone] Fax: [phone]   Pre-screening note:  Our staff contacted Karen Lewis and offered her an "in person", "face-to-face" appointment versus a telephone encounter. She indicated preferring the telephone encounter, at this time.  Reason for Virtual Visit: COVID-19*  Social distancing based on CDC and AMA recommendations.   I contacted Karen Lewis on 01/10/2019 at 9:15 AM by telephone and clearly identified myself as Karen David, NP. I verified that I was speaking with the correct person using two identifiers (Name and date of birth: Karen Lewis).  Advanced Informed Consent I sought verbal advanced consent from Karen Lewis for telemedicine interactions and virtual visit. I informed Karen Lewis of the security and privacy concerns, risks, and limitations associated with performing an evaluation and management service by telephone. I also informed Karen Lewis of the availability of "in person" appointments and I informed her of the possibility of a patient responsible charge related to this service. Karen Lewis expressed understanding and agreed to proceed.   Historic Elements   Karen Lewis is a 48 y.o. year old, female patient evaluated today after her last encounter by our practice on 08/23/2018. Karen Lewis  has a past medical history of Anemia, Anginal pain ([place]), Anxiety, Arthralgia of hip (07/29/2015), Arthritis, Arthritis, degenerative (07/29/2015), Asthma, Cephalalgia (07/25/2014), Dependence on unknown drug ([place]), Depression, Diabetes mellitus without complication ([place]), Dysrhythmia, Eczema, Fibromyalgia, Gastritis, GERD  (gastroesophageal reflux disease), Gonalgia (07/29/2015), Gout, H/O cardiovascular disorder (03/10/2015), H/O surgical procedure (12/05/2012), H/O thyroid disease (03/10/2015), Headache, Herpes, History of artificial joint (07/29/2015), History of hiatal hernia, Hypertension, Hypomagnesemia, Hypothyroidism, LBP (low back pain) (07/29/2015), Neuromuscular disorder ([place]), Obesity, PCOS (polycystic ovarian syndrome), Primary osteoarthritis of both knees (07/29/2015), Sleep apnea, and Thyroid nodule (bilateral). She also  has a past surgical history that includes Laparoscopic partial gastrectomy; Shoulder arthroscopy (Right); Joint replacement (Left, hip); Carpal tunnel release (Bilateral); Diagnostic laparoscopy; Cholecystectomy; Trigger finger release (Right); Thyroidectomy (N/A, 11/12/2015); left trigger finger; Roux-en-Y Gastric Bypass (06/03/2017); Hiatal hernia repair; and peniculectomy (N/A, 07/05/2018). Karen Lewis has a current medication list which includes the following prescription(s): levothyroxine, metoprolol tartrate, acetaminophen, acyclovir, albuterol, amitriptyline, biotin, accu-chek aviva, accu-chek aviva plus, butalbital-acetaminophen-caffeine, clotrimazole, cyanocobalamin, diphenhydramine, diphenhydramine, emollient, epinephrine, estradiol, famotidine, famotidine, ferrous sulfate, fluconazole, gabapentin, gabapentin, guaifenesin-codeine, ipratropium-albuterol, ketoconazole, simple diagnostics lancing dev, levocetirizine, levofloxacin, levothyroxine, levothyroxine, levothyroxine, magnesium, magnesium oxide, meclizine, medroxyprogesterone, metformin, metoprolol tartrate, montelukast, multivitamin with minerals, naloxone, nitrofurantoin (macrocrystal-monohydrate), omeprazole, ondansetron, oxycodone, oxycodone, oxycodone, ozempic (0.25 or 0.5 mg/dose), phentermine, pioglitazone, pregabalin, quetiapine, ranitidine, ranitidine, rosuvastatin, sertraline, spironolactone, tizanidine, triamcinolone, triamcinolone,  true metrix blood glucose test, ulticare alcohol swabs, vitamin c, vitamin d (ergocalciferol), zolpidem, and zonisamide. She  reports that she has never smoked. She has never used smokeless tobacco. She reports that she does not drink alcohol or use drugs. Karen Lewis is allergic to bactrim [sulfamethoxazole-trimethoprim]; omalizumab; ciprofloxacin; shellfish allergy; aspirin; clindamycin; motrin [ibuprofen]; nsaids; other; and sulfa antibiotics.   HPI  I last saw her on 08/23/2018. She is being evaluated for medication management. She is having 5/10 low back, right hip and knee pain. She is aware that she needs hip surgery but has some limitations at this time. She states her current weight is 340 lbs. She is concern  about abdominal burning. She has been having problems since her surgery. She is having a CT scan today to further evaluate. She would like to restart on Lyrica for her firbomyalgia. She was given on month and then insurance would not cover. She admits that we were planning to taper the Gabapentin.   Pharmacotherapy Assessment  Analgesic:Oxycodone/APAP 5/325 one every 6 hours when necessary for pain (20 mg/day) MME/day:30 mg/day.  Monitoring: Pharmacotherapy: No side-effects or adverse reactions reported. [place] PMP: PDMP reviewed during this encounter.       Compliance: No problems identified. Plan: Refer to "POC".  Review of recent tests  CT ABDOMEN PELVIS W CONTRAST CLINICAL DATA:  Lower abdominal pain. Previous gastric bypass surgery and panniculectomy.  EXAM: CT ABDOMEN AND PELVIS WITH CONTRAST  TECHNIQUE: Multidetector CT imaging of the abdomen and pelvis was performed using the standard protocol following bolus administration of intravenous contrast.  CONTRAST:  174mL OMNIPAQUE IOHEXOL 300 MG/ML  SOLN  COMPARISON:  11/09/2018  FINDINGS: Lower Chest: No acute findings.  Hepatobiliary: No hepatic masses identified. Prior cholecystectomy. No evidence of biliary  obstruction.  Pancreas:  No mass or inflammatory changes.  Spleen: Within normal limits in size and appearance.  Adrenals/Urinary Tract: No masses identified. No evidence of hydronephrosis. Bladder is not well visualized due to beam hardening artifact from left hip prosthesis.  Stomach/Bowel: Stable postop changes from previous gastric bypass surgery. No evidence of obstruction, inflammatory process or abnormal fluid collections.  Vascular/Lymphatic: No pathologically enlarged lymph nodes. No abdominal aortic aneurysm. IVC filter remains in appropriate position.  Reproductive:  No mass or other significant abnormality.  Other: Postop changes in anterior abdominal wall from panniculectomy. Two adjacent fluid collections are seen in the left lateral abdominal wall subcutaneous fat, which show fistulae to the adjacent skin surface. These measure 5.8 cm and 5.0 cm in maximum diameter. Small midline epigastric ventral hernia containing only fat.  Musculoskeletal: No suspicious bone lesions identified. Left hip prosthesis and severe right hip osteoarthritis are again demonstrated.  IMPRESSION: 1. Postop changes from gastric bypass surgery and panniculectomy. Two adjacent fluid collections in left lateral abdominal wall subcutaneous fat, with cutaneous fistulae. 2. Small epigastric ventral hernia containing only fat.  Electronically Signed   By: Earle Gell M.D.   On: 01/10/2019 16:28   Hospital Outpatient Visit on 08/28/2018  Component Date Value Ref Range Status  . WBC 08/28/2018 15.1* 4.0 - 10.5 K/uL Final  . RBC 08/28/2018 4.25  3.87 - 5.11 MIL/uL Final  . Hemoglobin 08/28/2018 9.3* 12.0 - 15.0 g/dL Final  . HCT 08/28/2018 32.3* 36.0 - 46.0 % Final  . MCV 08/28/2018 76.0* 80.0 - 100.0 fL Final  . MCH 08/28/2018 21.9* 26.0 - 34.0 pg Final  . MCHC 08/28/2018 28.8* 30.0 - 36.0 g/dL Final  . RDW 08/28/2018 16.2* 11.5 - 15.5 % Final  . Platelets 08/28/2018 555* 150 - 400  K/uL Final  . nRBC 08/28/2018 0.0  0.0 - 0.2 % Final   Performed at [facility], [address] [place], [place] [address]   Assessment  The primary encounter diagnosis was Chronic pain syndrome. Diagnoses of Fibromyalgia, Neurogenic pain, Hypomagnesemia, Lumbar spondylosis, Chronic knee pain (Primary Source of Pain) (Bilateral) (R>L), and Chronic hip pain (Secondary source of pain) (Right) were also pertinent to this visit.  Plan of Care  I have discontinued Karen Lewis's ketorolac. I have also changed her oxyCODONE, oxyCODONE, and oxyCODONE. Additionally, I am having her start on pregabalin. Lastly, I am having her  maintain her montelukast, spironolactone, metoprolol tartrate, metFORMIN, omeprazole, albuterol, acyclovir, multivitamin with minerals, tiZANidine, True Metrix Blood Glucose Test, meclizine, ketoconazole, zonisamide, butalbital-acetaminophen-caffeine, ipratropium-albuterol, sertraline, ranitidine, levocetirizine, triamcinolone, Accu-Chek Aviva, Accu-Chek Aviva Plus, QUEtiapine, triamcinolone, Acetaminophen, guaiFENesin-codeine, levothyroxine, levothyroxine, rosuvastatin, phentermine, UltiCare Alcohol Swabs, Simple Diagnostics Lancing Dev, naloxone, ranitidine, medroxyPROGESTERone, pioglitazone, nitrofurantoin (macrocrystal-monohydrate), famotidine, estradiol, diphenhydrAMINE, amitriptyline, levothyroxine, Biotin, zolpidem, vitamin C, Magnesium, Emollient (A + D PERSONAL CARE LOTION EX), Cyanocobalamin (VITAMIN B 12 PO), clotrimazole, EPINEPHrine, Vitamin D (Ergocalciferol), ferrous sulfate, fluconazole, levofloxacin, ondansetron, Ozempic (0.25 or 0.5 MG/DOSE), diphenhydrAMINE, famotidine, metoprolol tartrate, levothyroxine, gabapentin, magnesium oxide, and gabapentin.  Pharmacotherapy (Medications Ordered): Meds ordered this encounter  Medications  . gabapentin (NEURONTIN) 800 MG tablet    Sig: Take 1 tablet (800 mg total) by mouth every 6 (six) hours.    Dispense:  360 tablet     Refill:  0    Do not place this medication, or any other prescription from our practice, on "Automatic Refill". Patient may have prescription filled one day early if pharmacy is closed on scheduled refill date.    Order Specific Question:   Supervising Provider    Answer:   Milinda Pointer [phone]  . magnesium oxide (MAG-OX) 400 MG tablet    Sig: Take 1 tablet (400 mg total) by mouth 2 (two) times daily.    Dispense:  60 tablet    Refill:  2    Do not place this medication, or any other prescription from our practice, on "Automatic Refill". Patient may have prescription filled one day early if pharmacy is closed on scheduled refill date.    Order Specific Question:   Supervising Provider    Answer:   Milinda Pointer [phone]  . gabapentin (NEURONTIN) 800 MG tablet    Sig: Take 2 tablets (1,600 mg total) by mouth 2 (two) times daily.    Dispense:  120 tablet    Refill:  2    Order Specific Question:   Supervising Provider    Answer:   Milinda Pointer [phone]  . oxyCODONE (OXY IR/ROXICODONE) 5 MG immediate release tablet    Sig: Take 1 tablet (5 mg total) by mouth 5 (five) times daily as needed for up to 30 days for severe pain.    Dispense:  150 tablet    Refill:  0    Do not place this medication, or any other prescription from our practice, on "Automatic Refill". Patient may have prescription filled one day early if pharmacy is closed on scheduled refill date.    Order Specific Question:   Supervising Provider    Answer:   Milinda Pointer [phone]  . oxyCODONE (OXY IR/ROXICODONE) 5 MG immediate release tablet    Sig: Take 1 tablet (5 mg total) by mouth 5 (five) times daily as needed for up to 30 days for severe pain.    Dispense:  150 tablet    Refill:  0    Do not place this medication, or any other prescription from our practice, on "Automatic Refill". Patient may have prescription filled one day early if pharmacy is closed on scheduled refill date.    Order Specific  Question:   Supervising Provider    Answer:   Milinda Pointer [phone]  . oxyCODONE (OXY IR/ROXICODONE) 5 MG immediate release tablet    Sig: Take 1 tablet (5 mg total) by mouth 5 (five) times daily as needed for up to 30 days for severe pain.    Dispense:  150 tablet    Refill:  0    Fill one day early if pharmacy is closed on scheduled refill date.    Order Specific Question:   Supervising Provider    Answer:   Milinda Pointer [phone]  . pregabalin (LYRICA) 25 MG capsule    Sig: Take 1 capsule (25 mg total) by mouth 3 (three) times daily.    Dispense:  90 capsule    Refill:  0    Do not place this medication, or any other prescription from our practice, on "Automatic Refill". Patient may have prescription filled one day early if pharmacy is closed on scheduled refill date.    Order Specific Question:   Supervising Provider    Answer:   Milinda Pointer [phone]   Orders:  No orders of the defined types were placed in this encounter.  Follow-up plan:   Return in about 3 months (around 04/11/2019) for MedMgmt.   I discussed the assessment and treatment plan with the patient. The patient was provided an opportunity to ask questions and all were answered. The patient agreed with the plan and demonstrated an understanding of the instructions.  Patient advised to call back or seek an in-person evaluation if the symptoms or condition worsens.  Total duration of non-face-to-face encounter: 13 minutes.  Note by: Karen David, NP Date: 01/10/2019; Time: 11:32 AM  Disclaimer:  * Given the special circumstances of the COVID-19 pandemic, the federal government has announced that the Office for Civil Rights (OCR) will exercise its enforcement discretion and will not impose penalties on physicians using telehealth in the event of noncompliance with regulatory requirements under the [place] (HIPAA) in connection with the good faith provision of  telehealth during the UYQIH-47 national public health emergency. ([place])

## 2019-01-10 NOTE — Patient Instructions (Signed)
____________________________________________________________________________________________  Medication Rules  Purpose: To inform patients, and their family members, of our rules and regulations.  Applies to: All patients receiving prescriptions (written or electronic).  Pharmacy of record: Pharmacy where electronic prescriptions will be sent. If written prescriptions are taken to a different pharmacy, please inform the nursing staff. The pharmacy listed in the electronic medical record should be the one where you would like electronic prescriptions to be sent.  Electronic prescriptions: In compliance with the [place] Strengthen Opioid Misuse Prevention (STOP) Act of 2017 (Session Law 2017-74/H243), effective September 13, 2018, all controlled substances must be electronically prescribed. Calling prescriptions to the pharmacy will cease to exist.  Prescription refills: Only during scheduled appointments. Applies to all prescriptions.  NOTE: The following applies primarily to controlled substances (Opioid* Pain Medications).   Patient's responsibilities: 1. Pain Pills: Bring all pain pills to every appointment (except for procedure appointments). 2. Pill Bottles: Bring pills in original pharmacy bottle. Always bring the newest bottle. Bring bottle, even if empty. 3. Medication refills: You are responsible for knowing and keeping track of what medications you take and those you need refilled. The day before your appointment: write a list of all prescriptions that need to be refilled. The day of the appointment: give the list to the admitting nurse. Prescriptions will be written only during appointments. No prescriptions will be written on procedure days. If you forget a medication: it will not be "Called in", "Faxed", or "electronically sent". You will need to get another appointment to get these prescribed. No early refills. Do not call asking to have your prescription filled  early. 4. Prescription Accuracy: You are responsible for carefully inspecting your prescriptions before leaving our office. Have the discharge nurse carefully go over each prescription with you, before taking them home. Make sure that your name is accurately spelled, that your address is correct. Check the name and dose of your medication to make sure it is accurate. Check the number of pills, and the written instructions to make sure they are clear and accurate. Make sure that you are given enough medication to last until your next medication refill appointment. 5. Taking Medication: Take medication as prescribed. When it comes to controlled substances, taking less pills or less frequently than prescribed is permitted and encouraged. Never take more pills than instructed. Never take medication more frequently than prescribed.  6. Inform other Doctors: Always inform, all of your healthcare providers, of all the medications you take. 7. Pain Medication from other Providers: You are not allowed to accept any additional pain medication from any other Doctor or Healthcare provider. There are two exceptions to this rule. (see below) In the event that you require additional pain medication, you are responsible for notifying us, as stated below. 8. Medication Agreement: You are responsible for carefully reading and following our Medication Agreement. This must be signed before receiving any prescriptions from our practice. Safely store a copy of your signed Agreement. Violations to the Agreement will result in no further prescriptions. (Additional copies of our Medication Agreement are available upon request.) 9. Laws, Rules, & Regulations: All patients are expected to follow all Federal and State Laws, Statutes, Rules, & Regulations. Ignorance of the Laws does not constitute a valid excuse. The use of any illegal substances is prohibited. 10. Adopted CDC guidelines & recommendations: Target dosing levels will be  at or below 60 MME/day. Use of benzodiazepines** is not recommended.  Exceptions: There are only two exceptions to the rule of not   receiving pain medications from other Healthcare Providers. 1. Exception #1 (Emergencies): In the event of an emergency (i.e.: accident requiring emergency care), you are allowed to receive additional pain medication. However, you are responsible for: As soon as you are able, call our office [phone], at any time of the day or night, and leave a message stating your name, the date and nature of the emergency, and the name and dose of the medication prescribed. In the event that your call is answered by a member of our staff, make sure to document and save the date, time, and the name of the person that took your information.  2. Exception #2 (Planned Surgery): In the event that you are scheduled by another doctor or dentist to have any type of surgery or procedure, you are allowed (for a period no longer than 30 days), to receive additional pain medication, for the acute post-op pain. However, in this case, you are responsible for picking up a copy of our "Post-op Pain Management for Surgeons" handout, and giving it to your surgeon or dentist. This document is available at our office, and does not require an appointment to obtain it. Simply go to our office during business hours (Monday-Thursday from 8:00 AM to 4:00 PM) (Friday 8:00 AM to 12:00 Noon) or if you have a scheduled appointment with us, prior to your surgery, and ask for it by name. In addition, you will need to provide us with your name, name of your surgeon, type of surgery, and date of procedure or surgery.  *Opioid medications include: morphine, codeine, oxycodone, oxymorphone, hydrocodone, hydromorphone, meperidine, tramadol, tapentadol, buprenorphine, fentanyl, methadone. **Benzodiazepine medications include: diazepam (Valium), alprazolam (Xanax), clonazepam (Klonopine), lorazepam (Ativan), clorazepate  (Tranxene), chlordiazepoxide (Librium), estazolam (Prosom), oxazepam (Serax), temazepam (Restoril), triazolam (Halcion) (Last updated: 11/10/2017) ____________________________________________________________________________________________    

## 2019-01-18 ENCOUNTER — Encounter: Payer: Medicare HMO | Admitting: Nurse Practitioner

## 2019-02-06 ENCOUNTER — Telehealth: Payer: Self-pay

## 2019-02-06 NOTE — Telephone Encounter (Signed)
The patient wants to know if Dr. [name] will call out her lyrica to [facility]

## 2019-02-08 ENCOUNTER — Other Ambulatory Visit: Payer: Self-pay | Admitting: Pain Medicine

## 2019-02-08 DIAGNOSIS — E1159 Type 2 diabetes mellitus with other circulatory complications: Secondary | ICD-10-CM

## 2019-02-08 DIAGNOSIS — M797 Fibromyalgia: Secondary | ICD-10-CM

## 2019-02-08 DIAGNOSIS — Z79891 Long term (current) use of opiate analgesic: Secondary | ICD-10-CM

## 2019-02-08 DIAGNOSIS — M792 Neuralgia and neuritis, unspecified: Secondary | ICD-10-CM

## 2019-02-08 DIAGNOSIS — M793 Panniculitis, unspecified: Secondary | ICD-10-CM

## 2019-02-08 DIAGNOSIS — F1190 Opioid use, unspecified, uncomplicated: Secondary | ICD-10-CM

## 2019-02-08 DIAGNOSIS — F331 Major depressive disorder, recurrent, moderate: Secondary | ICD-10-CM

## 2019-02-08 MED ORDER — NALOXONE HCL 4 MG/0.1ML NA LIQD: NASAL | 0 refills | Status: DC

## 2019-02-08 MED ORDER — PREGABALIN 25 MG PO CAPS: 25.0000 mg | ORAL_CAPSULE | Freq: Three times a day (TID) | ORAL | 0 refills | Status: DC

## 2019-02-12 ENCOUNTER — Other Ambulatory Visit: Payer: Self-pay | Admitting: Pain Medicine

## 2019-02-12 ENCOUNTER — Telehealth: Payer: Self-pay

## 2019-02-12 NOTE — Telephone Encounter (Signed)
She thought her Lyrica was supposed to be titrated up. She is currently taking 25 mg tid. Please call

## 2019-02-12 NOTE — Telephone Encounter (Signed)
Did you intend to taper up? There was not an indication of this in the notes.

## 2019-02-13 ENCOUNTER — Telehealth: Payer: Self-pay | Admitting: *Deleted

## 2019-02-13 DIAGNOSIS — I119 Hypertensive heart disease without heart failure: Secondary | ICD-10-CM | POA: Diagnosis not present

## 2019-02-13 DIAGNOSIS — M793 Panniculitis, unspecified: Secondary | ICD-10-CM | POA: Diagnosis not present

## 2019-02-13 DIAGNOSIS — E1140 Type 2 diabetes mellitus with diabetic neuropathy, unspecified: Secondary | ICD-10-CM | POA: Diagnosis not present

## 2019-02-13 DIAGNOSIS — R627 Adult failure to thrive: Secondary | ICD-10-CM | POA: Diagnosis not present

## 2019-02-13 DIAGNOSIS — F329 Major depressive disorder, single episode, unspecified: Secondary | ICD-10-CM | POA: Diagnosis not present

## 2019-02-13 DIAGNOSIS — N179 Acute kidney failure, unspecified: Secondary | ICD-10-CM | POA: Diagnosis not present

## 2019-02-13 DIAGNOSIS — A318 Other mycobacterial infections: Secondary | ICD-10-CM | POA: Diagnosis not present

## 2019-02-13 DIAGNOSIS — A319 Mycobacterial infection, unspecified: Secondary | ICD-10-CM | POA: Diagnosis not present

## 2019-02-13 DIAGNOSIS — Z452 Encounter for adjustment and management of vascular access device: Secondary | ICD-10-CM | POA: Diagnosis not present

## 2019-02-13 DIAGNOSIS — T8142XD Infection following a procedure, deep incisional surgical site, subsequent encounter: Secondary | ICD-10-CM | POA: Diagnosis not present

## 2019-02-13 DIAGNOSIS — T8142XA Infection following a procedure, deep incisional surgical site, initial encounter: Secondary | ICD-10-CM | POA: Diagnosis not present

## 2019-02-13 DIAGNOSIS — G8929 Other chronic pain: Secondary | ICD-10-CM | POA: Diagnosis not present

## 2019-02-13 DIAGNOSIS — T8189XA Other complications of procedures, not elsewhere classified, initial encounter: Secondary | ICD-10-CM | POA: Diagnosis not present

## 2019-02-13 DIAGNOSIS — E43 Unspecified severe protein-calorie malnutrition: Secondary | ICD-10-CM | POA: Diagnosis not present

## 2019-02-13 DIAGNOSIS — M797 Fibromyalgia: Secondary | ICD-10-CM | POA: Diagnosis not present

## 2019-02-13 DIAGNOSIS — G43909 Migraine, unspecified, not intractable, without status migrainosus: Secondary | ICD-10-CM | POA: Diagnosis not present

## 2019-02-13 NOTE — Telephone Encounter (Signed)
Patient was given Lyrica 25mg  Tid by [facility], and she is tolerating it with no side effects. She wants to know if she can have an increase and taper up. She has another prescription, but does not want to fill it until she knows if you want to taper it. Please let us know so we can call her.

## 2019-02-14 NOTE — Telephone Encounter (Signed)
Please call Karen Lewis (DOB: 1971/04/30) (MRN: [id number]) and offer to schedule:  Visit: Virtual visit Type: Medication management  NOTE: If not interested, we'll deal with it in the next F/U appointment.  I need to have a conversation with this patient because it needs to be clear that she cannot be taking the gabapentin and the Lyrica.  It will be either or.

## 2019-02-14 NOTE — Telephone Encounter (Signed)
Marka will need a phone appt to talk about this

## 2019-02-14 NOTE — Telephone Encounter (Signed)
Contact the patient and inform her that she needs to have a virtual visit for me so that we can have this clear.  We need to stop going back and forth on these patient calls and prescriptions over the phone.

## 2019-02-15 ENCOUNTER — Encounter: Payer: Self-pay | Admitting: Pain Medicine

## 2019-02-15 DIAGNOSIS — N390 Urinary tract infection, site not specified: Secondary | ICD-10-CM | POA: Diagnosis not present

## 2019-02-15 DIAGNOSIS — R109 Unspecified abdominal pain: Secondary | ICD-10-CM | POA: Diagnosis not present

## 2019-02-15 DIAGNOSIS — M793 Panniculitis, unspecified: Secondary | ICD-10-CM | POA: Diagnosis not present

## 2019-02-15 DIAGNOSIS — R5383 Other fatigue: Secondary | ICD-10-CM | POA: Diagnosis not present

## 2019-02-16 DIAGNOSIS — M793 Panniculitis, unspecified: Secondary | ICD-10-CM | POA: Diagnosis not present

## 2019-02-16 DIAGNOSIS — G8929 Other chronic pain: Secondary | ICD-10-CM | POA: Diagnosis not present

## 2019-02-16 DIAGNOSIS — E1140 Type 2 diabetes mellitus with diabetic neuropathy, unspecified: Secondary | ICD-10-CM | POA: Diagnosis not present

## 2019-02-16 DIAGNOSIS — G43909 Migraine, unspecified, not intractable, without status migrainosus: Secondary | ICD-10-CM | POA: Diagnosis not present

## 2019-02-16 DIAGNOSIS — M797 Fibromyalgia: Secondary | ICD-10-CM | POA: Diagnosis not present

## 2019-02-16 DIAGNOSIS — I119 Hypertensive heart disease without heart failure: Secondary | ICD-10-CM | POA: Diagnosis not present

## 2019-02-16 DIAGNOSIS — F329 Major depressive disorder, single episode, unspecified: Secondary | ICD-10-CM | POA: Diagnosis not present

## 2019-02-16 DIAGNOSIS — T8142XD Infection following a procedure, deep incisional surgical site, subsequent encounter: Secondary | ICD-10-CM | POA: Diagnosis not present

## 2019-02-18 NOTE — Progress Notes (Signed)
Pain Management Virtual Encounter Note - Virtual Visit via Telephone Telehealth (real-time audio visits between healthcare provider and patient).   Patient's Phone No. & Preferred Pharmacy:  [phone] (home); [phone] (mobile); (Preferred) [phone] [email or web address]  [place], [place] - [address] [place] [address] Phone: [phone] Fax: [phone]    Pre-screening note:  Our staff contacted Karen Lewis and offered her an "in person", "face-to-face" appointment versus a telephone encounter. She indicated preferring the telephone encounter, at this time.   Reason for Virtual Visit: COVID-19*  Social distancing based on CDC and AMA recommendations.   I contacted Karen Lewis on 02/19/2019 at 1:26 PM via telephone.      I clearly identified myself as Gaspar Cola, MD. I verified that I was speaking with the correct person using two identifiers (Name: Karen Lewis, and date of birth: 30-May-1971).  Advanced Informed Consent I sought verbal advanced consent from Karen Lewis for virtual visit interactions. I informed Karen Lewis of possible security and privacy concerns, risks, and limitations associated with providing "not-in-person" medical evaluation and management services. I also informed Karen Lewis of the availability of "in-person" appointments. Finally, I informed her that there would be a charge for the virtual visit and that she could be  personally, fully or partially, financially responsible for it. Karen Lewis expressed understanding and agreed to proceed.   Historic Elements   Karen Lewis is a 48 y.o. year old, female patient evaluated today after her last encounter by our practice on 02/13/2019. Karen Lewis  has a past medical history of Anemia, Anginal pain ([place]), Anxiety, Arthralgia of hip (07/29/2015), Arthritis, Arthritis, degenerative (07/29/2015), Asthma, Cephalalgia (07/25/2014), Dependence on unknown drug ([place]),  Depression, Diabetes mellitus without complication ([place]), Dysrhythmia, Eczema, Fibromyalgia, Gastritis, GERD (gastroesophageal reflux disease), Gonalgia (07/29/2015), Gout, H/O cardiovascular disorder (03/10/2015), H/O surgical procedure (12/05/2012), H/O thyroid disease (03/10/2015), Headache, Herpes, History of artificial joint (07/29/2015), History of hiatal hernia, Hypertension, Hypomagnesemia, Hypothyroidism, LBP (low back pain) (07/29/2015), Neuromuscular disorder ([place]), Obesity, PCOS (polycystic ovarian syndrome), Primary osteoarthritis of both knees (07/29/2015), Sleep apnea, and Thyroid nodule (bilateral). She also  has a past surgical history that includes Laparoscopic partial gastrectomy; Shoulder arthroscopy (Right); Joint replacement (Left, hip); Carpal tunnel release (Bilateral); Diagnostic laparoscopy; Cholecystectomy; Trigger finger release (Right); Thyroidectomy (N/A, 11/12/2015); left trigger finger; Roux-en-Y Gastric Bypass (06/03/2017); Hiatal hernia repair; and peniculectomy (N/A, 07/05/2018). Karen Lewis has a current medication list which includes the following prescription(s): acyclovir, albuterol, amitriptyline, biotin, clotrimazole, diphenhydramine, doxycycline, emollient, epinephrine, famotidine, ferrous sulfate, gabapentin, ketoconazole, levothyroxine, lidocaine, lisinopril, magnesium oxide, metformin, metoprolol tartrate, montelukast, multivitamin with minerals, naloxone, nitrofurantoin (macrocrystal-monohydrate), omeprazole, ondansetron, oxycodone, oxycodone, ozempic (0.25 or 0.5 mg/dose), pioglitazone, pregabalin, sertraline, spironolactone, triamcinolone, vitamin c, zolpidem, zonisamide, and oxycodone. She  reports that she has never smoked. She has never used smokeless tobacco. She reports that she does not drink alcohol or use drugs. Karen Lewis is allergic to bactrim [sulfamethoxazole-trimethoprim]; omalizumab; ciprofloxacin; shellfish allergy; aspirin; clindamycin; motrin [ibuprofen];  nsaids; other; and sulfa antibiotics.   HPI  Today, she is being contacted for medication management.  The patient indicates doing well on the medication and she actually completely weaned herself off of the Neurontin.  Today we will continue increasing her Lyrica as tolerated.  Pharmacotherapy Assessment  Analgesic: Oxycodone/APAP 5/325 one every 6 hours when necessary for pain (20 mg/day) MME/day:30 mg/day.  Monitoring: Pharmacotherapy: No side-effects or adverse reactions reported. [place] PMP: PDMP not reviewed this encounter.  Compliance: No problems identified. Effectiveness: Clinically acceptable. Plan: Refer to "POC".  Pertinent Labs   SAFETY SCREENING Profile Lab Results  Component Value Date   STAPHAUREUS NEGATIVE 11/06/2015   MRSAPCR NEGATIVE 11/06/2015   PREGTESTUR NEGATIVE 11/12/2015   Renal Function Lab Results  Component Value Date   BUN 8 11/24/2015   CREATININE 0.80 01/10/2019   GFRAA >60 11/24/2015   GFRNONAA >60 11/24/2015   Hepatic Function Lab Results  Component Value Date   AST 23 11/24/2015   ALT 20 11/24/2015   ALBUMIN 3.7 11/24/2015   UDS Summary  Date Value Ref Range Status  08/23/2018 FINAL  Final    Comment:    ==================================================================== TOXASSURE SELECT 13 (MW) ==================================================================== Test                             Result       Flag       Units Drug Present and Declared for Prescription Verification   Oxycodone                      260          EXPECTED   ng/mg creat   Oxymorphone                    52           EXPECTED   ng/mg creat   Noroxycodone                   991          EXPECTED   ng/mg creat    Sources of oxycodone include scheduled prescription medications.    Oxymorphone and noroxycodone are expected metabolites of    oxycodone. Oxymorphone is also available as a scheduled    prescription medication. Drug Absent but Declared for  Prescription Verification   Codeine                        Not Detected UNEXPECTED ng/mg creat   Butalbital                     Not Detected UNEXPECTED ==================================================================== Test                      Result    Flag   Units      Ref Range   Creatinine              203              mg/dL      >=20 ==================================================================== Declared Medications:  The flagging and interpretation on this report are based on the  following declared medications.  Unexpected results may arise from  inaccuracies in the declared medications.  **Note: The testing scope of this panel includes these medications:  Butalbital (Butalbital/APAP/Caffeine)  Codeine  Oxycodone  **Note: The testing scope of this panel does not include following  reported medications:  Acetaminophen  Acetaminophen (Butalbital/APAP/Caffeine)  Acyclovir  Albuterol  Albuterol (Ipratropium-Albuterol)  Amitriptyline  Caffeine (Butalbital/APAP/Caffeine)  Clotrimazole  Cyanocobalamin  Diphenhydramine  Epinephrine  Estradiol  Famotidine  Fluconazole  Gabapentin  Guaifenesin  Ipratropium (Ipratropium-Albuterol)  Iron (Ferrous Sulfate)  Ketoconazole  Ketorolac  Levocetirizine  Levofloxacin  Levothyroxine  Magnesium  Magnesium Oxide  Meclizine  Medroxyprogesterone (Depo-Provera)  Metformin  Metoprolol  Montelukast  Naloxone  Nitrofurantoin (Macrobid)  Omeprazole  Ondansetron  Phentermine  Pioglitazone  Quetiapine  Ranitidine  Rosuvastatin  Semaglutide (Ozempic)  Sertraline  Spironolactone  Tizanidine  Triamcinolone  Vitamin B (Biotin)  Vitamin C  Vitamin D2 (Ergocalciferol)  Zolpidem  Zonisamide (Zonegran) ==================================================================== For clinical consultation, please call [phone]. ====================================================================    Note: Above Lab results  reviewed.  Recent imaging  CT ABDOMEN PELVIS W CONTRAST CLINICAL DATA:  Lower abdominal pain. Previous gastric bypass surgery and panniculectomy.  EXAM: CT ABDOMEN AND PELVIS WITH CONTRAST  TECHNIQUE: Multidetector CT imaging of the abdomen and pelvis was performed using the standard protocol following bolus administration of intravenous contrast.  CONTRAST:  146mL OMNIPAQUE IOHEXOL 300 MG/ML  SOLN  COMPARISON:  11/09/2018  FINDINGS: Lower Chest: No acute findings.  Hepatobiliary: No hepatic masses identified. Prior cholecystectomy. No evidence of biliary obstruction.  Pancreas:  No mass or inflammatory changes.  Spleen: Within normal limits in size and appearance.  Adrenals/Urinary Tract: No masses identified. No evidence of hydronephrosis. Bladder is not well visualized due to beam hardening artifact from left hip prosthesis.  Stomach/Bowel: Stable postop changes from previous gastric bypass surgery. No evidence of obstruction, inflammatory process or abnormal fluid collections.  Vascular/Lymphatic: No pathologically enlarged lymph nodes. No abdominal aortic aneurysm. IVC filter remains in appropriate position.  Reproductive:  No mass or other significant abnormality.  Other: Postop changes in anterior abdominal wall from panniculectomy. Two adjacent fluid collections are seen in the left lateral abdominal wall subcutaneous fat, which show fistulae to the adjacent skin surface. These measure 5.8 cm and 5.0 cm in maximum diameter. Small midline epigastric ventral hernia containing only fat.  Musculoskeletal: No suspicious bone lesions identified. Left hip prosthesis and severe right hip osteoarthritis are again demonstrated.  IMPRESSION: 1. Postop changes from gastric bypass surgery and panniculectomy. Two adjacent fluid collections in left lateral abdominal wall subcutaneous fat, with cutaneous fistulae. 2. Small epigastric ventral hernia containing only  fat.  Electronically Signed   By: Earle Gell M.D.   On: 01/10/2019 16:28  Assessment  The primary encounter diagnosis was Chronic pain syndrome. Diagnoses of Chronic knee pain (Primary Source of Pain) (Bilateral) (R>L), Chronic hip pain (Secondary source of pain) (Right), Chronic low back pain [facility] source of pain) (Bilateral) (R>L), Fibromyalgia, Neurogenic pain, and Hypomagnesemia were also pertinent to this visit.  Plan of Care  I have changed Stellar A. Messman's oxyCODONE, oxyCODONE, and pregabalin. I am also having her maintain her montelukast, spironolactone, metoprolol tartrate, metFORMIN, omeprazole, albuterol, acyclovir, multivitamin with minerals, ketoconazole, zonisamide, triamcinolone, pioglitazone, nitrofurantoin (macrocrystal-monohydrate), amitriptyline, Biotin, zolpidem, vitamin C, Emollient (A + D PERSONAL CARE LOTION EX), clotrimazole, EPINEPHrine, ferrous sulfate, ondansetron, Ozempic (0.25 or 0.5 MG/DOSE), diphenhydrAMINE, famotidine, levothyroxine, gabapentin, oxyCODONE, doxycycline, lidocaine, lisinopril, sertraline, naloxone, and magnesium oxide.  Pharmacotherapy (Medications Ordered): Meds ordered this encounter  Medications  . oxyCODONE (OXY IR/ROXICODONE) 5 MG immediate release tablet    Sig: Take 1 tablet (5 mg total) by mouth 5 (five) times daily for 30 days. Must last 30 days    Dispense:  150 tablet    Refill:  0    Chronic Pain: STOP Act - Not applicable. Fill 1 day early if closed on scheduled refill date. Do not fill until: 05/10/2019. To last until: 06/09/2019. Instruct to avoid benzodiazepines within 8 hours of opioid.  Marland Kitchen oxyCODONE (OXY IR/ROXICODONE) 5 MG immediate release tablet    Sig: Take 1 tablet (5 mg total) by mouth 5 (five) times daily for 30 days. Must last 30 days  Dispense:  150 tablet    Refill:  0    Chronic Pain: STOP Act - Not applicable. Fill 1 day early if closed on scheduled refill date. Do not fill until: 04/10/2019. To last until:  05/10/2019. Instruct to avoid benzodiazepines within 8 hours of opioid.  . magnesium oxide (MAG-OX) 400 MG tablet    Sig: Take 1 tablet (400 mg total) by mouth 2 (two) times daily.    Dispense:  60 tablet    Refill:  1    Fill one day early if pharmacy is closed on scheduled refill date. May substitute for generic if available.  . pregabalin (LYRICA) 50 MG capsule    Sig: Take 1 tab PO TID x 7 days, then 2 tab PO TID x 7 days, followed by 3 tab PO TID and stay at this dose.    Dispense:  207 capsule    Refill:  0    Fill one day early if pharmacy is closed on scheduled refill date. May substitute for generic if available.   Orders:  No orders of the defined types were placed in this encounter.  Follow-up plan:   Return in about 4 months (around 06/06/2019) for (1 mo) Med-Mgmt, (Virtual Visit).  Upon her return, we will check to see how she is doing on the Lyrica and if need be, we will increase it.   I discussed the assessment and treatment plan with the patient. The patient was provided an opportunity to ask questions and all were answered. The patient agreed with the plan and demonstrated an understanding of the instructions.  Patient advised to call back or seek an in-person evaluation if the symptoms or condition worsens.  Total duration of non-face-to-face encounter: 12 minutes.  Note by: Gaspar Cola, MD Date: 02/19/2019; Time: 2:38 PM  Note: This dictation was prepared with Dragon dictation. Any transcriptional errors that may result from this process are unintentional.  Disclaimer:  * Given the special circumstances of the COVID-19 pandemic, the federal government has announced that the Office for Civil Rights (OCR) will exercise its enforcement discretion and will not impose penalties on physicians using telehealth in the event of noncompliance with regulatory requirements under the [place] (HIPAA) in connection with the good faith  provision of telehealth during the FIEPP-29 national public health emergency. ([place])

## 2019-02-19 ENCOUNTER — Other Ambulatory Visit: Payer: Self-pay

## 2019-02-19 ENCOUNTER — Ambulatory Visit: Payer: Medicare HMO | Attending: Pain Medicine | Admitting: Pain Medicine

## 2019-02-19 DIAGNOSIS — M25551 Pain in right hip: Secondary | ICD-10-CM

## 2019-02-19 DIAGNOSIS — M792 Neuralgia and neuritis, unspecified: Secondary | ICD-10-CM | POA: Diagnosis not present

## 2019-02-19 DIAGNOSIS — M25561 Pain in right knee: Secondary | ICD-10-CM

## 2019-02-19 DIAGNOSIS — T8142XD Infection following a procedure, deep incisional surgical site, subsequent encounter: Secondary | ICD-10-CM | POA: Diagnosis not present

## 2019-02-19 DIAGNOSIS — G894 Chronic pain syndrome: Secondary | ICD-10-CM | POA: Diagnosis not present

## 2019-02-19 DIAGNOSIS — M797 Fibromyalgia: Secondary | ICD-10-CM | POA: Diagnosis not present

## 2019-02-19 DIAGNOSIS — E1140 Type 2 diabetes mellitus with diabetic neuropathy, unspecified: Secondary | ICD-10-CM | POA: Diagnosis not present

## 2019-02-19 DIAGNOSIS — M793 Panniculitis, unspecified: Secondary | ICD-10-CM | POA: Diagnosis not present

## 2019-02-19 DIAGNOSIS — G8929 Other chronic pain: Secondary | ICD-10-CM

## 2019-02-19 DIAGNOSIS — F329 Major depressive disorder, single episode, unspecified: Secondary | ICD-10-CM | POA: Diagnosis not present

## 2019-02-19 DIAGNOSIS — I119 Hypertensive heart disease without heart failure: Secondary | ICD-10-CM | POA: Diagnosis not present

## 2019-02-19 DIAGNOSIS — G43909 Migraine, unspecified, not intractable, without status migrainosus: Secondary | ICD-10-CM | POA: Diagnosis not present

## 2019-02-19 DIAGNOSIS — M25562 Pain in left knee: Secondary | ICD-10-CM | POA: Diagnosis not present

## 2019-02-19 DIAGNOSIS — M545 Low back pain: Secondary | ICD-10-CM | POA: Diagnosis not present

## 2019-02-19 MED ORDER — MAGNESIUM OXIDE 400 MG PO TABS: 400.0000 mg | ORAL_TABLET | Freq: Two times a day (BID) | ORAL | 1 refills | Status: DC

## 2019-02-19 MED ORDER — PREGABALIN 50 MG PO CAPS: ORAL_CAPSULE | ORAL | 0 refills | Status: DC

## 2019-02-19 MED ORDER — OXYCODONE HCL 5 MG PO TABS: 5.0000 mg | ORAL_TABLET | Freq: Every day | ORAL | 0 refills | Status: DC

## 2019-02-22 ENCOUNTER — Ambulatory Visit: Payer: Medicare HMO | Attending: Infectious Diseases | Admitting: Infectious Diseases

## 2019-02-22 ENCOUNTER — Other Ambulatory Visit: Payer: Self-pay

## 2019-02-22 ENCOUNTER — Encounter: Payer: Self-pay | Admitting: Infectious Diseases

## 2019-02-22 VITALS — BP 105/70 | HR 141 | Temp 99.3°F | Ht 64.0 in | Wt 355.0 lb

## 2019-02-22 DIAGNOSIS — Z8614 Personal history of Methicillin resistant Staphylococcus aureus infection: Secondary | ICD-10-CM

## 2019-02-22 DIAGNOSIS — Z79899 Other long term (current) drug therapy: Secondary | ICD-10-CM

## 2019-02-22 DIAGNOSIS — I10 Essential (primary) hypertension: Secondary | ICD-10-CM | POA: Diagnosis not present

## 2019-02-22 DIAGNOSIS — F419 Anxiety disorder, unspecified: Secondary | ICD-10-CM | POA: Diagnosis not present

## 2019-02-22 DIAGNOSIS — Z7984 Long term (current) use of oral hypoglycemic drugs: Secondary | ICD-10-CM

## 2019-02-22 DIAGNOSIS — Z9884 Bariatric surgery status: Secondary | ICD-10-CM

## 2019-02-22 DIAGNOSIS — E785 Hyperlipidemia, unspecified: Secondary | ICD-10-CM | POA: Diagnosis not present

## 2019-02-22 DIAGNOSIS — F329 Major depressive disorder, single episode, unspecified: Secondary | ICD-10-CM

## 2019-02-22 DIAGNOSIS — M797 Fibromyalgia: Secondary | ICD-10-CM

## 2019-02-22 DIAGNOSIS — Z96642 Presence of left artificial hip joint: Secondary | ICD-10-CM

## 2019-02-22 DIAGNOSIS — Z881 Allergy status to other antibiotic agents status: Secondary | ICD-10-CM

## 2019-02-22 DIAGNOSIS — M793 Panniculitis, unspecified: Secondary | ICD-10-CM

## 2019-02-22 DIAGNOSIS — A318 Other mycobacterial infections: Secondary | ICD-10-CM

## 2019-02-22 DIAGNOSIS — A319 Mycobacterial infection, unspecified: Secondary | ICD-10-CM

## 2019-02-22 DIAGNOSIS — A311 Cutaneous mycobacterial infection: Secondary | ICD-10-CM

## 2019-02-22 DIAGNOSIS — G43909 Migraine, unspecified, not intractable, without status migrainosus: Secondary | ICD-10-CM | POA: Diagnosis not present

## 2019-02-22 DIAGNOSIS — G4733 Obstructive sleep apnea (adult) (pediatric): Secondary | ICD-10-CM | POA: Diagnosis not present

## 2019-02-22 DIAGNOSIS — Z886 Allergy status to analgesic agent status: Secondary | ICD-10-CM

## 2019-02-22 DIAGNOSIS — Z978 Presence of other specified devices: Secondary | ICD-10-CM

## 2019-02-22 DIAGNOSIS — Z91013 Allergy to seafood: Secondary | ICD-10-CM

## 2019-02-22 DIAGNOSIS — E119 Type 2 diabetes mellitus without complications: Secondary | ICD-10-CM | POA: Diagnosis not present

## 2019-02-22 DIAGNOSIS — Z79890 Hormone replacement therapy (postmenopausal): Secondary | ICD-10-CM

## 2019-02-22 NOTE — Patient Instructions (Signed)
You have been referred to me for an infection  Detected during your last panniculectomy surgery. You say it is a type of mycobacteria. I dont have the result . We need to get the culture result and susceptibility and then decide on the proper antibiotics.

## 2019-02-22 NOTE — Progress Notes (Signed)
NAME: Karen Lewis  DOB: March 26, 1971  MRN: [id number]  Date/Time: 02/22/2019 12:01 PM  REQUESTING PROVIDER:sparks Subjective:  REASON FOR CONSULT: Abdominal wall infection ? 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    It was not healing well on the left side  and one spot was tunneling       culture had pseudomonas, MRSA and was taing levaquin/Doxy, had wound vac at one time after debridement( NOV/Dec) She had low grade fever and she was on Tylenol.   Had CT abdomen which showed some fluid collection- and aspiration did not grow any bacteria . She did not take any antibiotics after feb 2020. She had seen ID in [place] that time but did not want to go back there. On May 27 th as she was continuing to have some redness and panniculitis she underwent panniculectomy     She is here to see me as the culture came back as mycobacteria according to her. I dont have the results Pt has low grade fever and has some fatigue She has fibromyalgia and also has left THA in 2013 followed by MRSA infection which was treated by Dr.Fitzgerald  With IV vancomycin So   Past Medical History:  Diagnosis Date   Anemia    Anginal pain ([place])    Anxiety    Arthralgia of hip 07/29/2015   Arthritis    Arthritis, degenerative 07/29/2015   Asthma    Cephalalgia 07/25/2014   Dependence on unknown drug ([place])    multiplt controlled drug dependence   Depression    Diabetes mellitus without complication ([place])    Dysrhythmia    Eczema    Fibromyalgia    Gastritis    GERD (gastroesophageal reflux disease)    Gonalgia  07/29/2015   Overview:  Overview:  The patient has had bilateral intra-articular Hyalgan injections done on 07/16/2014 and although she seems to do well with this type of therapy, apparently her insurance company does not want to pay for they Hyalgan. On 11/27/2014 the patient underwent a bilateral genicular nerve block with excellent results. On 01/28/2015 she had a right knee genicular radiofrequency ablatio   Gout    H/O cardiovascular disorder 03/10/2015   H/O surgical procedure 12/05/2012   Overview:  LSG ([facility] - April 2013)     H/O thyroid disease 03/10/2015   Headache    Herpes    History of artificial joint 07/29/2015   History of hiatal hernia    Hypertension    Hypomagnesemia    Hypothyroidism    LBP (low back pain) 07/29/2015   Neuromuscular disorder (H[facility])    Obesity    PCOS (polycystic ovarian syndrome)    Primary osteoarthritis of both knees 07/29/2015   Sleep apnea    Thyroid nodule bilateral   legionella disease- was on azithromycin for 2 months in 2000 Past Surgical History:  Procedure Laterality Date   CARPAL TUNNEL RELEASE Bilateral    CHOLECYSTECTOMY     DIAGNOSTIC LAPAROSCOPY     HIATAL HERNIA REPAIR     JOINT REPLACEMENT Left  hip   LAPAROSCOPIC PARTIAL GASTRECTOMY     left trigger finger     peniculectomy N/A 07/05/2018   ROUX-EN-Y GASTRIC BYPASS  06/03/2017   SHOULDER ARTHROSCOPY Right    THYROIDECTOMY N/A 11/12/2015   Procedure: THYROIDECTOMY;  Surgeon: Clyde Canterbury, MD;  Location: [facility] ORS;  Service: ENT;  Laterality: N/A;   TRIGGER FINGER RELEASE Right     Social History   Socioeconomic History   Marital status: Single    Spouse name: Not on file   Number of children: Not on file   Years of education: Not on file   Highest education level: Not on file  Occupational History   Not on file  Social Needs   Financial resource strain: Not on file   Food insecurity    Worry: Not on file    Inability: Not on file    Transportation needs    Medical: Not on file    Non-medical: Not on file  Tobacco Use   Smoking status: Never Smoker   Smokeless tobacco: Never Used  Substance and Sexual Activity   Alcohol use: No    Alcohol/week: 0.0 standard drinks   Drug use: No   Sexual activity: Not Currently    Birth control/protection: Injection  Lifestyle   Physical activity    Days per week: Not on file    Minutes per session: Not on file   Stress: Not on file  Relationships   Social connections    Talks on phone: Not on file    Gets together: Not on file    Attends religious service: Not on file    Active member of club or organization: Not on file    Attends meetings of clubs or organizations: Not on file    Relationship status: Not on file   Intimate partner violence    Fear of current or ex partner: Not on file    Emotionally abused: Not on file    Physically abused: Not on file    Forced sexual activity: Not on file  Other Topics Concern   Not on file  Social History Narrative   Not on file    Family History  Problem Relation Age of Onset   Anxiety disorder Mother    Depression Mother    Alcohol abuse Mother    Diabetes Mother    Hypertension Mother    Kidney cancer Mother    Sleep apnea Mother    Alcohol abuse Father    Anxiety disorder Father    Depression Father    Post-traumatic stress disorder Father    Kidney failure Father    COPD Father    Diabetes Father    Hypertension Father    Sleep apnea Father    Depression Brother    Diabetes Brother    Hypertension Brother    Sleep apnea Brother    Breast cancer Paternal Aunt    Bladder Cancer Neg Hx    Prostate cancer Neg Hx    Allergies  Allergen Reactions   Bactrim [Sulfamethoxazole-Trimethoprim] Hives   Omalizumab Itching and Hives   Ciprofloxacin Other (See Comments)    Muscle Pain Possible myalgias. myalgia Possible myalgias. myalgia   Shellfish Allergy Other (See  Comments)    unknown   Aspirin Other (See Comments)    GI UPSET   Clindamycin Rash   Motrin [Ibuprofen]     Other reaction(s): Other (See Comments) GI UPSET   Nsaids Nausea Only and Other (See Comments)  Stomach upset Other reaction(s): Other (See Comments) Stomach upset   Other Hives   Sulfa Antibiotics Hives   ID  Recent  Procedure Surgery Injections Trauma Sick contacts Travel Antibiotic use Food- raw/exotic Steroid/immune suppressants/splenectomy/Hardware Animal bites Tick exposure Water sports Fishing/hunting/animal bird exposure ? Current Outpatient Medications  Medication Sig Dispense Refill   acyclovir (ZOVIRAX) 800 MG tablet Take 800 mg by mouth as needed. Reported on 11/12/2015     albuterol (PROVENTIL HFA;VENTOLIN HFA) 108 (90 BASE) MCG/ACT inhaler Inhale 2 puffs into the lungs every 6 (six) hours as needed for wheezing or shortness of breath.     amitriptyline (ELAVIL) 100 MG tablet   3   Biotin 10 MG CAPS      clotrimazole (MYCELEX) 10 MG troche Take 10 mg by mouth as needed.  3   diphenhydrAMINE (BENADRYL) 25 mg capsule Take 25 mg by mouth as needed.     Emollient (A + D PERSONAL CARE LOTION EX) Apply 1 application topically as needed.     EPINEPHrine 0.3 mg/0.3 mL IJ SOAJ injection Inject 0.3 mg into the skin as needed.     ferrous sulfate 324 (65 Fe) MG TBEC Take 324 mg by mouth as needed.     gabapentin (NEURONTIN) 800 MG tablet Take 1 tablet (800 mg total) by mouth every 6 (six) hours. (Patient taking differently: Take 800 mg by mouth 2 (two) times daily. ) 360 tablet 0   ketoconazole (NIZORAL) 2 % cream APPLY AS DIRECTED TO AFFECTED AREA TWICE DAILY AS NEEDED     levothyroxine (SYNTHROID) 125 MCG tablet Take by mouth.     lidocaine (XYLOCAINE) 2 % solution      lisinopril (ZESTRIL) 5 MG tablet Take by mouth.     [START ON 04/10/2019] magnesium oxide (MAG-OX) 400 MG tablet Take 1 tablet (400 mg total) by mouth 2 (two) times daily.  60 tablet 1   metFORMIN (GLUCOPHAGE) 1000 MG tablet Take 1,000 mg by mouth 2 (two) times daily with a meal.      metoprolol (LOPRESSOR) 50 MG tablet Take 75 mg by mouth 2 (two) times daily.      montelukast (SINGULAIR) 10 MG tablet Take 10 mg by mouth at bedtime.      Multiple Vitamin (MULTIVITAMIN WITH MINERALS) TABS tablet Take 2 tablets by mouth daily. Reported on 11/12/2015     naloxone [facility]) nasal spray 4 mg/0.1 mL Spray into one nostril. Repeat with second device into other nostril after 2-3 minutes if no or minimal response. 1 kit 0   nitrofurantoin, macrocrystal-monohydrate, (MACROBID) 100 MG capsule      omeprazole (PRILOSEC) 40 MG capsule Take 40 mg by mouth 2 (two) times daily.      ondansetron (ZOFRAN-ODT) 4 MG disintegrating tablet Take 4 mg by mouth as needed.  0   [START ON 03/11/2019] oxyCODONE (OXY IR/ROXICODONE) 5 MG immediate release tablet Take 1 tablet (5 mg total) by mouth 5 (five) times daily as needed for up to 30 days for severe pain. 150 tablet 0   [START ON 05/10/2019] oxyCODONE (OXY IR/ROXICODONE) 5 MG immediate release tablet Take 1 tablet (5 mg total) by mouth 5 (five) times daily for 30 days. Must last 30 days 150 tablet 0   [START ON 04/10/2019] oxyCODONE (OXY IR/ROXICODONE) 5 MG immediate release tablet Take 1 tablet (5 mg total) by mouth 5 (five) times daily for 30 days. Must last 30 days 150 tablet 0   pregabalin (LYRICA) 50 MG capsule Take 1 tab  PO TID x 7 days, then 2 tab PO TID x 7 days, followed by 3 tab PO TID and stay at this dose. 207 capsule 0   QUEtiapine (SEROQUEL) 100 MG tablet Take 100 mg by mouth at bedtime.     sertraline (ZOLOFT) 20 MG/ML concentrated solution Take by mouth.     spironolactone (ALDACTONE) 25 MG tablet Take 37.5 mg by mouth 2 (two) times daily.      triamcinolone (NASACORT AQ) 55 MCG/ACT AERO nasal inhaler 2 sprays by Nasal route 2 (two) times daily.     vitamin C (ASCORBIC ACID) 500 MG tablet Take 500 mg by mouth  daily.     zolpidem (AMBIEN) 10 MG tablet Take 10 mg by mouth at bedtime as needed for sleep.     zonisamide (ZONEGRAN) 50 MG capsule Take 150 mg by mouth daily. 3 tabs at bedtime     doxycycline (VIBRA-TABS) 100 MG tablet      famotidine (PEPCID) 20 MG tablet Take 20 mg by mouth as needed.     OZEMPIC, 0.25 OR 0.5 MG/DOSE, 2 MG/1.5ML SOPN Inject 2 mg into the skin once a week.  6   pioglitazone (ACTOS) 15 MG tablet      No current facility-administered medications for this visit.      Abtx:  Anti-infectives (From admission, onward)   None      REVIEW OF SYSTEMS:  Const:  fever, negative chills,  weight loss Eyes: negative diplopia or visual changes, negative eye pain ENT: negative coryza, negative sore throat Resp: negative cough, hemoptysis, dyspnea Cards: negative for chest pain, palpitations, lower extremity edema GU: negative for frequency, dysuria and hematuria, has odor and dark color GI: Negative for abdominal pain, diarrhea, bleeding, constipation Skin: negative for rash and pruritus Heme: negative for easy bruising and gum/nose bleeding MS:r myalgias, arthralgias, back pain and muscle weakness Neurolo: headaches, dizziness, vertigo, memory problems  Psych:  anxiety, depression  Endocrine:on synthroid Allergy/Immunology- multiple allergies Objective:  VITALS:  BP 105/70 (BP Location: Left Arm, Patient Position: Sitting, Cuff Size: Large)    Pulse (!) 141    Temp 99.3 F (37.4 C) (Oral)    Ht '5\' 4"'  (1.626 m)    Wt (!) 355 lb (161 kg)    BMI 60.94 kg/m  PHYSICAL EXAM:  General: Alert, cooperative, no distress, appears stated age.in wheel chair  Head: Normocephalic, without obvious abnormality, atraumatic. Eyes: did not examine ENT did not examine Neck: Supple,  Lungs: Clear to auscultation bilaterally. No Wheezing or Rhonchi. No rales. Heart: Regular rate and rhythm, no murmur, rub or gallop. Abdomen: examined surgical scar on the left healed well- 2 drains  present   Extremities: atraumatic, no cyanosis. No edema. No clubbing Skin: No rashes or lesions. Or bruising Lymph: Cervical, supraclavicular normal. Neurologic: Grossly non-focal Pertinent Labs Lab Results CBC    Component Value Date/Time   WBC 15.1 (H) 08/28/2018 1630   RBC 4.25 08/28/2018 1630   HGB 9.3 (L) 08/28/2018 1630   HGB 12.1 12/13/2013 1628   HCT 32.3 (L) 08/28/2018 1630   HCT 38.4 12/13/2013 1628   PLT 555 (H) 08/28/2018 1630   PLT 364 12/13/2013 1628   MCV 76.0 (L) 08/28/2018 1630   MCV 82 12/13/2013 1628   MCH 21.9 (L) 08/28/2018 1630   MCHC 28.8 (L) 08/28/2018 1630   RDW 16.2 (H) 08/28/2018 1630   RDW 16.9 (H) 12/13/2013 1628   LYMPHSABS 2.6 05/08/2015 2200   LYMPHSABS 3.2 04/10/2013 1836  MONOABS 0.8 05/08/2015 2200   MONOABS 0.7 04/10/2013 1836   EOSABS 0.0 05/08/2015 2200   EOSABS 0.3 04/10/2013 1836   BASOSABS 0.0 05/08/2015 2200   BASOSABS 0.1 04/10/2013 1836    CMP Latest Ref Rng & Units 01/10/2019 05/11/2016 11/24/2015  Glucose 65 - 99 mg/dL - - 143(H)  BUN 6 - 20 mg/dL - - 8  Creatinine 0.44 - 1.00 mg/dL 0.80 0.80 0.76  Sodium 135 - 145 mmol/L - - 139  Potassium 3.5 - 5.1 mmol/L - - 4.3  Chloride 101 - 111 mmol/L - - 105  CO2 22 - 32 mmol/L - - 23  Calcium 8.9 - 10.3 mg/dL - - 8.6(L)  Total Protein 6.5 - 8.1 g/dL - - 7.6  Total Bilirubin 0.3 - 1.2 mg/dL - - 0.3  Alkaline Phos 38 - 126 U/L - - 81  AST 15 - 41 U/L - - 23  ALT 14 - 54 U/L - - 20      Microbiology: 02/07/19  Culture from the abdominal wound has to be obtained   IMAGING RESULTS: Not available  Impression/Recommendation ?48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   It was not healing well on the left side  and one spot was tunneling  culture had pseudomonas, MRSA and was taing levaquin/Doxy, had wound vac at one time after debridement( NOV/Dec) She had low grade fever and she was on Tylenol.  Had CT abdomen which showed some fluid collection- and aspiration did not grow any bacteria . She did not take any antibiotics after feb 2020. She had seen ID in [place] that time but did not want to go back there. On May 27 th as she was continuing to have some redness and panniculitis she underwent panniculectomy.  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 Also explained to her that it would entail combination therpay and may be a prolonged course  Left THA- hardware present- stable.  H/o treated MRSA infection   DM-on metformin  HTN on lisinopril  Hyperlipidemia on rosuvastatin  Recurrent UTI as per patient ( very likely asymptomatic bacteruria) she has been on macrobid  Told her to try non antibiotic interventions like staying well hydrated, probiotic, estrace cream, proper hygiene, cranberry supplement .  Will see her in 2-3 weeks ?Discussed with her in detail  After she left the office I got the fax and also spoke to her surgeon Dr.Pidali The tissue culture was mycobacterium abscessus but susceptibility could not be done because the lab discarded the specimen. She has a drain and so will get fluid and send for culture- If that is not successful Dr.Pidali will get some tissue from the drain site.

## 2019-02-23 ENCOUNTER — Other Ambulatory Visit
Admission: RE | Admit: 2019-02-23 | Discharge: 2019-02-23 | Disposition: A | Discharge status: Home / Self Care | Payer: Medicare HMO | Source: Ambulatory Visit | Attending: Infectious Diseases | Admitting: Infectious Diseases

## 2019-02-23 ENCOUNTER — Ambulatory Visit: Payer: Medicare HMO | Attending: Infectious Diseases | Admitting: Infectious Diseases

## 2019-02-23 ENCOUNTER — Other Ambulatory Visit: Payer: Self-pay | Admitting: Infectious Diseases

## 2019-02-23 DIAGNOSIS — A319 Mycobacterial infection, unspecified: Secondary | ICD-10-CM

## 2019-02-23 LAB — CBC WITH DIFFERENTIAL/PLATELET
Abs Immature Granulocytes: 0.05 10*3/uL (ref 0.00–0.07)
Basophils Absolute: 0.1 10*3/uL (ref 0.0–0.1)
Basophils Relative: 1 %
Eosinophils Absolute: 0.2 10*3/uL (ref 0.0–0.5)
Eosinophils Relative: 1 %
HCT: 29.1 % — ABNORMAL LOW (ref 36.0–46.0)
Hemoglobin: 8.3 g/dL — ABNORMAL LOW (ref 12.0–15.0)
Immature Granulocytes: 0 %
Lymphocytes Relative: 19 %
Lymphs Abs: 2.4 10*3/uL (ref 0.7–4.0)
MCH: 19.1 pg — ABNORMAL LOW (ref 26.0–34.0)
MCHC: 28.5 g/dL — ABNORMAL LOW (ref 30.0–36.0)
MCV: 66.9 fL — ABNORMAL LOW (ref 80.0–100.0)
Monocytes Absolute: 0.7 10*3/uL (ref 0.1–1.0)
Monocytes Relative: 6 %
Neutro Abs: 9.4 10*3/uL — ABNORMAL HIGH (ref 1.7–7.7)
Neutrophils Relative %: 73 %
Platelets: 560 10*3/uL — ABNORMAL HIGH (ref 150–400)
RBC: 4.35 MIL/uL (ref 3.87–5.11)
RDW: 19.7 % — ABNORMAL HIGH (ref 11.5–15.5)
Smear Review: NORMAL
WBC: 12.7 10*3/uL — ABNORMAL HIGH (ref 4.0–10.5)
nRBC: 0 % (ref 0.0–0.2)

## 2019-02-23 LAB — COMPREHENSIVE METABOLIC PANEL
ALT: 17 U/L (ref 0–44)
AST: 20 U/L (ref 15–41)
Albumin: 3.8 g/dL (ref 3.5–5.0)
Alkaline Phosphatase: 81 U/L (ref 38–126)
Anion gap: 12 (ref 5–15)
BUN: 11 mg/dL (ref 6–20)
CO2: 21 mmol/L — ABNORMAL LOW (ref 22–32)
Calcium: 9 mg/dL (ref 8.9–10.3)
Chloride: 105 mmol/L (ref 98–111)
Creatinine, Ser: 0.71 mg/dL (ref 0.44–1.00)
GFR calc Af Amer: >60 mL/min (ref >60)
GFR calc non Af Amer: >60 mL/min (ref >60)
Glucose, Bld: 120 mg/dL — ABNORMAL HIGH (ref 70–99)
Potassium: 4.4 mmol/L (ref 3.5–5.1)
Sodium: 138 mmol/L (ref 135–145)
Total Bilirubin: 0.4 mg/dL (ref 0.3–1.2)
Total Protein: 7.7 g/dL (ref 6.5–8.1)

## 2019-02-23 LAB — SEDIMENTATION RATE: Sed Rate: 73 mm/hr — ABNORMAL HIGH (ref 0–20)

## 2019-02-23 LAB — C-REACTIVE PROTEIN: CRP: 0.9 mg/dL (ref <1.0)

## 2019-02-23 LAB — TECHNOLOGIST SMEAR REVIEW

## 2019-02-26 ENCOUNTER — Telehealth: Payer: Self-pay | Admitting: Licensed Clinical Social Worker

## 2019-02-26 ENCOUNTER — Telehealth: Payer: Self-pay | Admitting: *Deleted

## 2019-02-26 DIAGNOSIS — I119 Hypertensive heart disease without heart failure: Secondary | ICD-10-CM | POA: Diagnosis not present

## 2019-02-26 DIAGNOSIS — G8929 Other chronic pain: Secondary | ICD-10-CM | POA: Diagnosis not present

## 2019-02-26 DIAGNOSIS — G43909 Migraine, unspecified, not intractable, without status migrainosus: Secondary | ICD-10-CM | POA: Diagnosis not present

## 2019-02-26 DIAGNOSIS — E1140 Type 2 diabetes mellitus with diabetic neuropathy, unspecified: Secondary | ICD-10-CM | POA: Diagnosis not present

## 2019-02-26 DIAGNOSIS — F329 Major depressive disorder, single episode, unspecified: Secondary | ICD-10-CM | POA: Diagnosis not present

## 2019-02-26 DIAGNOSIS — M797 Fibromyalgia: Secondary | ICD-10-CM | POA: Diagnosis not present

## 2019-02-26 DIAGNOSIS — M793 Panniculitis, unspecified: Secondary | ICD-10-CM | POA: Diagnosis not present

## 2019-02-26 DIAGNOSIS — T8142XD Infection following a procedure, deep incisional surgical site, subsequent encounter: Secondary | ICD-10-CM | POA: Diagnosis not present

## 2019-02-26 NOTE — Telephone Encounter (Signed)
Patient called stating that she wanted something for itching and benadryl is not helping anymore. Per Dr. Delaine Lame she will not prescribe anything and she will need to talk to her PCP.

## 2019-02-26 NOTE — Telephone Encounter (Signed)
Spoke with patient about itching from Lyrica.  States she has generalized itching, no redness or rash, has not started any new medications or changed anything in her daily life. Denies yellowing of eyes.  Taking Lyrica 50 mg tid.  Has been itching since she started Lyrica but has a history of itching with medications in general.  States she usually uses benadryl but it is not helping anymore. States she is having issues with infection at present. Patient wishes to continue therapy with Lyrica but would like Dr [name] to order some Atarax.  Spoke with Dr [name] and with her current medical situation, would like for her PCP to order the Atarax.  Patient notified.

## 2019-02-28 DIAGNOSIS — M797 Fibromyalgia: Secondary | ICD-10-CM | POA: Diagnosis not present

## 2019-02-28 DIAGNOSIS — F329 Major depressive disorder, single episode, unspecified: Secondary | ICD-10-CM | POA: Diagnosis not present

## 2019-02-28 DIAGNOSIS — G43909 Migraine, unspecified, not intractable, without status migrainosus: Secondary | ICD-10-CM | POA: Diagnosis not present

## 2019-02-28 DIAGNOSIS — E1140 Type 2 diabetes mellitus with diabetic neuropathy, unspecified: Secondary | ICD-10-CM | POA: Diagnosis not present

## 2019-02-28 DIAGNOSIS — T8142XD Infection following a procedure, deep incisional surgical site, subsequent encounter: Secondary | ICD-10-CM | POA: Diagnosis not present

## 2019-02-28 DIAGNOSIS — M793 Panniculitis, unspecified: Secondary | ICD-10-CM | POA: Diagnosis not present

## 2019-02-28 DIAGNOSIS — G8929 Other chronic pain: Secondary | ICD-10-CM | POA: Diagnosis not present

## 2019-02-28 DIAGNOSIS — I119 Hypertensive heart disease without heart failure: Secondary | ICD-10-CM | POA: Diagnosis not present

## 2019-03-01 ENCOUNTER — Telehealth: Payer: Self-pay

## 2019-03-01 NOTE — Telephone Encounter (Signed)
She states that once she started taking the Lyrica, she is smelling smoke. Like a chemical smoke smell. Please call her.

## 2019-03-01 NOTE — Telephone Encounter (Signed)
Patient states she is having a chemical smell after she takes Lyrica.  Asked patinet if she was having benefit from the Lyrica and she states yes.  Asked patient if benefits was worth the smell and she states yes.  She would like to continue the Lyrica.  Instructed patient to call us for any further questions or concerns.

## 2019-03-05 ENCOUNTER — Telehealth: Payer: Self-pay | Admitting: Licensed Clinical Social Worker

## 2019-03-05 DIAGNOSIS — G43909 Migraine, unspecified, not intractable, without status migrainosus: Secondary | ICD-10-CM | POA: Diagnosis not present

## 2019-03-05 DIAGNOSIS — M797 Fibromyalgia: Secondary | ICD-10-CM | POA: Diagnosis not present

## 2019-03-05 DIAGNOSIS — T8142XD Infection following a procedure, deep incisional surgical site, subsequent encounter: Secondary | ICD-10-CM | POA: Diagnosis not present

## 2019-03-05 DIAGNOSIS — I119 Hypertensive heart disease without heart failure: Secondary | ICD-10-CM | POA: Diagnosis not present

## 2019-03-05 DIAGNOSIS — E1140 Type 2 diabetes mellitus with diabetic neuropathy, unspecified: Secondary | ICD-10-CM | POA: Diagnosis not present

## 2019-03-05 DIAGNOSIS — M793 Panniculitis, unspecified: Secondary | ICD-10-CM | POA: Diagnosis not present

## 2019-03-05 DIAGNOSIS — F329 Major depressive disorder, single episode, unspecified: Secondary | ICD-10-CM | POA: Diagnosis not present

## 2019-03-05 DIAGNOSIS — G8929 Other chronic pain: Secondary | ICD-10-CM | POA: Diagnosis not present

## 2019-03-05 NOTE — Telephone Encounter (Signed)
Can you call lab and ask them what happened to the AFB culture- They only did a DNA probe- can you ask them to add culture to the sample- thx

## 2019-03-05 NOTE — Telephone Encounter (Signed)
Patient called with questions about her results and what the next steps would be?

## 2019-03-06 ENCOUNTER — Telehealth: Payer: Self-pay | Admitting: Licensed Clinical Social Worker

## 2019-03-06 NOTE — Telephone Encounter (Signed)
Patients lab for AFB culture was not completed due to insufficient sample. I called the patient to let her and she will be going to her surgeon on Thursday. I faxed orders to [facility] in [place] to collect sample per Dr. Delaine Lame.

## 2019-03-06 NOTE — Telephone Encounter (Signed)
Talk to the lab and they did not have enough sample to perform the test

## 2019-03-12 ENCOUNTER — Other Ambulatory Visit: Payer: Self-pay | Admitting: Infectious Diseases

## 2019-03-12 DIAGNOSIS — A318 Other mycobacterial infections: Secondary | ICD-10-CM

## 2019-03-12 DIAGNOSIS — A319 Mycobacterial infection, unspecified: Secondary | ICD-10-CM

## 2019-03-13 ENCOUNTER — Ambulatory Visit
Admission: RE | Admit: 2019-03-13 | Discharge: 2019-03-13 | Disposition: A | Discharge status: Home / Self Care | Payer: Medicare HMO | Source: Ambulatory Visit | Attending: Infectious Diseases | Admitting: Infectious Diseases

## 2019-03-13 ENCOUNTER — Other Ambulatory Visit: Payer: Self-pay | Admitting: Infectious Diseases

## 2019-03-13 ENCOUNTER — Telehealth: Payer: Self-pay | Admitting: Licensed Clinical Social Worker

## 2019-03-13 ENCOUNTER — Other Ambulatory Visit: Payer: Self-pay

## 2019-03-13 ENCOUNTER — Ambulatory Visit
Admission: RE | Admit: 2019-03-13 | Discharge: 2019-03-13 | Disposition: A | Discharge status: Home / Self Care | Payer: Self-pay | Source: Ambulatory Visit | Attending: Infectious Diseases | Admitting: Infectious Diseases

## 2019-03-13 DIAGNOSIS — Y838 Other surgical procedures as the cause of abnormal reaction of the patient, or of later complication, without mention of misadventure at the time of the procedure: Secondary | ICD-10-CM | POA: Diagnosis not present

## 2019-03-13 DIAGNOSIS — T8149XA Infection following a procedure, other surgical site, initial encounter: Secondary | ICD-10-CM | POA: Insufficient documentation

## 2019-03-13 DIAGNOSIS — A319 Mycobacterial infection, unspecified: Secondary | ICD-10-CM | POA: Diagnosis not present

## 2019-03-13 DIAGNOSIS — B999 Unspecified infectious disease: Secondary | ICD-10-CM | POA: Diagnosis present

## 2019-03-13 MED ORDER — TIGECYCLINE 50 MG IV SOLR
25.0000 mg | Freq: Once | INTRAVENOUS | Status: AC
  Administered 2019-03-13: 25 mg via INTRAVENOUS
  Filled 2019-03-13: qty 25

## 2019-03-13 MED ORDER — TIGECYCLINE 50 MG IV SOLR: 25.0000 mg | Freq: Once | INTRAVENOUS | 0 refills | Status: AC

## 2019-03-13 MED ORDER — SODIUM CHLORIDE FLUSH 0.9 % IV SOLN
INTRAVENOUS | Status: AC
  Filled 2019-03-13: qty 10

## 2019-03-13 MED ORDER — AMIKACIN IV (FOR PTA / DISCHARGE USE ONLY): 1200.0000 mg | Freq: Once | INTRAVENOUS | 0 refills | Status: AC

## 2019-03-13 MED ORDER — SODIUM CHLORIDE 0.9% FLUSH: 10.0000 mL | Freq: Two times a day (BID) | INTRAVENOUS | Status: DC

## 2019-03-13 MED ORDER — SODIUM CHLORIDE 0.9 % IV SOLN
1.0000 g | Freq: Once | INTRAVENOUS | Status: AC
  Administered 2019-03-13: 1 g via INTRAVENOUS
  Filled 2019-03-13: qty 1000

## 2019-03-13 MED ORDER — NON FORMULARY: 1000.0000 mg | Freq: Once | Status: DC

## 2019-03-13 MED ORDER — SODIUM CHLORIDE 0.9% FLUSH: 10.0000 mL | INTRAVENOUS | Status: DC | PRN

## 2019-03-13 MED ORDER — DEXTROSE 5 % IV SOLN
1200.0000 mg | Freq: Once | INTRAVENOUS | Status: AC
  Administered 2019-03-13: 1200 mg via INTRAVENOUS
  Filled 2019-03-13: qty 4.8

## 2019-03-13 MED ORDER — IMIPENEM-CILASTATIN 500 MG IV SOLR: 1000.0000 mg | Freq: Two times a day (BID) | INTRAVENOUS | 0 refills | Status: DC

## 2019-03-13 NOTE — Progress Notes (Signed)
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 Will do Amikacin 600mg  every day ( daily- aiming for peak around 20-) after a loading dose of 10mg /kg =1200mg  today. Will give Imipenem 1 gram IV q 12 Tigecycline 25 mg one dose now and then will increase to 50mg  BID as tolerated

## 2019-03-13 NOTE — Progress Notes (Signed)
Peripherally Inserted Central Catheter/Midline Placement  The IV Nurse has discussed with the patient and/or persons authorized to consent for the patient, the purpose of this procedure and the potential benefits and risks involved with this procedure.  The benefits include less needle sticks, lab draws from the catheter, and the patient may be discharged home with the catheter. Risks include, but not limited to, infection, bleeding, blood clot (thrombus formation), and puncture of an artery; nerve damage and irregular heartbeat and possibility to perform a PICC exchange if needed/ordered by physician.  Alternatives to this procedure were also discussed.  [facility] PICC patient education guide, fact sheet on infection prevention and patient information card has been provided to patient /or left at bedside.    PICC/Midline Placement Documentation        Karen Lewis 03/13/2019, 4:51 PM

## 2019-03-13 NOTE — OR Nursing (Signed)
Report to Vesta Mixer RN. [place] insertion nurse to notify Silva Bandy once the PICC is in.

## 2019-03-13 NOTE — Telephone Encounter (Signed)
Spoke with patient to arrange PICC placement for today at 3:30 pm at [facility]. Patient agreed with plan. Orders in the computer to administer first dose at same day surgery. [facility]. Patient is currently [facility] for wound care, order will try to be set up with them for PICC care by [place].

## 2019-03-14 ENCOUNTER — Telehealth: Payer: Self-pay | Admitting: Licensed Clinical Social Worker

## 2019-03-14 ENCOUNTER — Ambulatory Visit: Admission: RE | Admit: 2019-03-14 | Payer: Medicare HMO | Source: Ambulatory Visit

## 2019-03-14 ENCOUNTER — Telehealth: Payer: Self-pay | Admitting: Pharmacist

## 2019-03-14 DIAGNOSIS — A319 Mycobacterial infection, unspecified: Secondary | ICD-10-CM

## 2019-03-14 DIAGNOSIS — A318 Other mycobacterial infections: Secondary | ICD-10-CM

## 2019-03-14 NOTE — Telephone Encounter (Signed)
Patient called about her medication being too expensive and would like to approved for tier reduction. I called [employer] and they are faxing paperwork for Dr. Delaine Lame to fill out. I will call the patient once an approval is reached.

## 2019-03-14 NOTE — Telephone Encounter (Signed)
Completed and sent clofazimine application for patient to [employer]. Will update Dr. Delaine Lame when approval status has been decided.

## 2019-03-14 NOTE — Telephone Encounter (Signed)
Thanks a lot 

## 2019-03-15 ENCOUNTER — Ambulatory Visit: Payer: Medicare HMO | Admitting: Infectious Diseases

## 2019-03-15 ENCOUNTER — Other Ambulatory Visit: Payer: Self-pay | Admitting: Licensed Clinical Social Worker

## 2019-03-16 DIAGNOSIS — G43909 Migraine, unspecified, not intractable, without status migrainosus: Secondary | ICD-10-CM | POA: Diagnosis not present

## 2019-03-16 DIAGNOSIS — M793 Panniculitis, unspecified: Secondary | ICD-10-CM | POA: Diagnosis not present

## 2019-03-16 DIAGNOSIS — E1140 Type 2 diabetes mellitus with diabetic neuropathy, unspecified: Secondary | ICD-10-CM | POA: Diagnosis not present

## 2019-03-16 DIAGNOSIS — T8142XD Infection following a procedure, deep incisional surgical site, subsequent encounter: Secondary | ICD-10-CM | POA: Diagnosis not present

## 2019-03-16 DIAGNOSIS — R7881 Bacteremia: Secondary | ICD-10-CM | POA: Diagnosis not present

## 2019-03-16 DIAGNOSIS — I119 Hypertensive heart disease without heart failure: Secondary | ICD-10-CM | POA: Diagnosis not present

## 2019-03-16 DIAGNOSIS — F329 Major depressive disorder, single episode, unspecified: Secondary | ICD-10-CM | POA: Diagnosis not present

## 2019-03-16 DIAGNOSIS — G8929 Other chronic pain: Secondary | ICD-10-CM | POA: Diagnosis not present

## 2019-03-16 DIAGNOSIS — M797 Fibromyalgia: Secondary | ICD-10-CM | POA: Diagnosis not present

## 2019-03-19 DIAGNOSIS — G8929 Other chronic pain: Secondary | ICD-10-CM | POA: Diagnosis not present

## 2019-03-19 DIAGNOSIS — E1140 Type 2 diabetes mellitus with diabetic neuropathy, unspecified: Secondary | ICD-10-CM | POA: Diagnosis not present

## 2019-03-19 DIAGNOSIS — G43909 Migraine, unspecified, not intractable, without status migrainosus: Secondary | ICD-10-CM | POA: Diagnosis not present

## 2019-03-19 DIAGNOSIS — M797 Fibromyalgia: Secondary | ICD-10-CM | POA: Diagnosis not present

## 2019-03-19 DIAGNOSIS — I119 Hypertensive heart disease without heart failure: Secondary | ICD-10-CM | POA: Diagnosis not present

## 2019-03-19 DIAGNOSIS — F329 Major depressive disorder, single episode, unspecified: Secondary | ICD-10-CM | POA: Diagnosis not present

## 2019-03-19 DIAGNOSIS — M793 Panniculitis, unspecified: Secondary | ICD-10-CM | POA: Diagnosis not present

## 2019-03-19 DIAGNOSIS — A319 Mycobacterial infection, unspecified: Secondary | ICD-10-CM | POA: Diagnosis not present

## 2019-03-19 DIAGNOSIS — T8142XD Infection following a procedure, deep incisional surgical site, subsequent encounter: Secondary | ICD-10-CM | POA: Diagnosis not present

## 2019-03-19 MED ORDER — AMBULATORY NON FORMULARY MEDICATION: 100.0000 mg | Freq: Every day | 5 refills | Status: DC

## 2019-03-19 NOTE — Telephone Encounter (Signed)
Thank you Cassie! 

## 2019-03-19 NOTE — Telephone Encounter (Signed)
Thanks cassie- once she goes on clofazimine I will stop Iv amikacin

## 2019-03-19 NOTE — Telephone Encounter (Signed)
Patient has been approved to receive clofazimine for her mycobacterium abscessus post op abdominal soft tissue infection.  Sent in the drug supply form today and the medication should arrive at the [facility] this week or next week (will update when I get a shipment confirmation). Will coordinate with Tamika at [facility] to courier medication to her once it arrives.

## 2019-03-19 NOTE — Addendum Note (Signed)
Addended by: Darletta Moll on: 03/19/2019 09:34 AM   Modules accepted: Orders

## 2019-03-20 ENCOUNTER — Telehealth: Payer: Self-pay | Admitting: Pain Medicine

## 2019-03-20 ENCOUNTER — Telehealth: Payer: Self-pay | Admitting: Licensed Clinical Social Worker

## 2019-03-20 ENCOUNTER — Other Ambulatory Visit: Payer: Self-pay | Admitting: Licensed Clinical Social Worker

## 2019-03-20 ENCOUNTER — Ambulatory Visit: Payer: Medicare HMO | Attending: Pain Medicine | Admitting: Pain Medicine

## 2019-03-20 ENCOUNTER — Other Ambulatory Visit: Payer: Self-pay

## 2019-03-20 ENCOUNTER — Other Ambulatory Visit: Payer: Self-pay | Admitting: Pain Medicine

## 2019-03-20 DIAGNOSIS — M792 Neuralgia and neuritis, unspecified: Secondary | ICD-10-CM

## 2019-03-20 DIAGNOSIS — M797 Fibromyalgia: Secondary | ICD-10-CM

## 2019-03-20 DIAGNOSIS — B370 Candidal stomatitis: Secondary | ICD-10-CM

## 2019-03-20 MED ORDER — PREGABALIN 225 MG PO CAPS: 225.0000 mg | ORAL_CAPSULE | Freq: Two times a day (BID) | ORAL | 0 refills | Status: DC

## 2019-03-20 MED ORDER — NYSTATIN 100000 UNIT/ML MT SUSP: 5.0000 mL | Freq: Four times a day (QID) | OROMUCOSAL | 1 refills | Status: DC

## 2019-03-20 NOTE — Telephone Encounter (Signed)
I called patient to check and see how she was feeling since she started Iv antibiotics and also to let her know her potassium was 5.6. Patient states that she was very exhausted but otherwise doing well. She also discontinued spirolactone for yesterday and today to help lower potassium. She is also increasing fluids.

## 2019-03-20 NOTE — Progress Notes (Signed)
Pain Management Virtual Encounter Note - Virtual Visit via Telephone Telehealth (real-time audio visits between healthcare provider and patient).   Patient's Phone No. & Preferred Pharmacy:  [phone] (home); [phone] (mobile); (Preferred) [phone] [email or web address]  [place], [place] - [address] [place] [address] Phone: [phone] Fax: [phone]    Pre-screening note:  Our staff contacted Karen Lewis and offered her an "in person", "face-to-face" appointment versus a telephone encounter. She indicated preferring the telephone encounter, at this time.   Reason for Virtual Visit: COVID-19*  Social distancing based on CDC and AMA recommendations.   I contacted Karen Lewis on 03/20/2019 via telephone.      I clearly identified myself as Gaspar Cola, MD. I verified that I was speaking with the correct person using two identifiers (Name: Karen Lewis, and date of birth: 1970-09-27).  Advanced Informed Consent I sought verbal advanced consent from Karen Lewis for virtual visit interactions. I informed Karen Lewis of possible security and privacy concerns, risks, and limitations associated with providing "not-in-person" medical evaluation and management services. I also informed Karen Lewis of the availability of "in-person" appointments. Finally, I informed her that there would be a charge for the virtual visit and that she could be  personally, fully or partially, financially responsible for it. Ms. Tatlock expressed understanding and agreed to proceed.   Historic Elements   Karen Lewis is a 48 y.o. year old, female patient evaluated today after her last encounter by our practice on 03/20/2019. Karen Lewis  has a past medical history of Anemia, Anginal pain ([place]), Anxiety, Arthralgia of hip (07/29/2015), Arthritis, Arthritis, degenerative (07/29/2015), Asthma, Cephalalgia (07/25/2014), Dependence on unknown drug ([place]), Depression,  Diabetes mellitus without complication ([place]), Dysrhythmia, Eczema, Fibromyalgia, Gastritis, GERD (gastroesophageal reflux disease), Gonalgia (07/29/2015), Gout, H/O cardiovascular disorder (03/10/2015), H/O surgical procedure (12/05/2012), H/O thyroid disease (03/10/2015), Headache, Herpes, History of artificial joint (07/29/2015), History of hiatal hernia, Hypertension, Hypomagnesemia, Hypothyroidism, LBP (low back pain) (07/29/2015), Neuromuscular disorder ([place]), Obesity, PCOS (polycystic ovarian syndrome), Primary osteoarthritis of both knees (07/29/2015), Sleep apnea, and Thyroid nodule (bilateral). She also  has a past surgical history that includes Laparoscopic partial gastrectomy; Shoulder arthroscopy (Right); Joint replacement (Left, hip); Carpal tunnel release (Bilateral); Diagnostic laparoscopy; Cholecystectomy; Trigger finger release (Right); Thyroidectomy (N/A, 11/12/2015); left trigger finger; Roux-en-Y Gastric Bypass (06/03/2017); Hiatal hernia repair; and peniculectomy (N/A, 07/05/2018). Karen Lewis has a current medication list which includes the following prescription(s): levothyroxine, metoprolol tartrate, montelukast, nystatin, tizanidine, acyclovir, albuterol, AMBULATORY NON FORMULARY MEDICATION, amitriptyline, biotin, clotrimazole, diphenhydramine, doxycycline, emollient, epinephrine, famotidine, ferrous sulfate, imipenem-cilastatin, ketoconazole, levothyroxine, lidocaine, lisinopril, magnesium oxide, metformin, metoprolol tartrate, metoprolol tartrate, montelukast, multivitamin with minerals, naloxone, nitrofurantoin (macrocrystal-monohydrate), nystatin, omeprazole, ondansetron, oxycodone, oxycodone, oxycodone, ozempic (0.25 or 0.5 mg/dose), pioglitazone, pregabalin, quetiapine, sertraline, spironolactone, tizanidine, triamcinolone, vitamin c, zolpidem, and zonisamide. She  reports that she has never smoked. She has never used smokeless tobacco. She reports that she does not drink alcohol or use drugs.  Karen Lewis is allergic to bactrim [sulfamethoxazole-trimethoprim]; omalizumab; ciprofloxacin; shellfish allergy; aspirin; clindamycin; motrin [ibuprofen]; nsaids; other; and sulfa antibiotics.   HPI  Today, she is being contacted for medication management.  The patient is being called to evaluate her recent change from gabapentin 800 mg 4 times daily to the Lyrica, which we are titrating up.  We started at 50 mg p.o. 3 times daily with instructions to increase it to 100 mg p.o. 3 times daily, over 3 weeks, as  tolerated.  Today I called the patient because there appeared to be a confusion regarding her follow-up appointment for the Lyrica.  I am glad that I called her because the prescription that I had written for 50 mg to be increased up to 3 tablets p.o. 3 times daily was replaced with a prescription for 50 mg 3 times daily x 1 week, 100 mg 3 times daily x1 week, and 150 mg 3 times daily x2 weeks.  I am glad that the patient was able to tolerate it, but the pharmacist took the liberty of substituting the 50 mg pills for 100 and then 150 mg pills, which defeated the purpose of my initial prescription which was to go up slowly by 50 mg increases and given the patient the ability to come back down to the prior dose if she experienced any side effects with 1 of the 50 mg increases.  The way that they gave her the pills did not allow for retracting to a lower dose.  In any case, today I gave the patient the option of staying with 150 mg 3 times daily or trying 225 mg twice daily.  She has opted to go with the 225 mg twice daily.  Pharmacotherapy Assessment  Analgesic: Oxycodone IR 5 mg 1 tab PO 5X/day (#150/mo) (25 mg/day) MME/day:37.5 mg/day.   Monitoring: Pharmacotherapy: No side-effects or adverse reactions reported. [place] PMP: PDMP reviewed during this encounter.       Compliance: No problems identified. Effectiveness: Clinically acceptable. Plan: Refer to "POC".  Pertinent Labs   SAFETY SCREENING  Profile Lab Results  Component Value Date   STAPHAUREUS NEGATIVE 11/06/2015   MRSAPCR NEGATIVE 11/06/2015   PREGTESTUR NEGATIVE 11/12/2015   Renal Function Lab Results  Component Value Date   BUN 11 02/23/2019   CREATININE 0.71 02/23/2019   GFRAA >60 02/23/2019   GFRNONAA >60 02/23/2019   Hepatic Function Lab Results  Component Value Date   AST 20 02/23/2019   ALT 17 02/23/2019   ALBUMIN 3.8 02/23/2019   UDS Summary  Date Value Ref Range Status  08/23/2018 FINAL  Final    Comment:    ==================================================================== TOXASSURE SELECT 13 (MW) ==================================================================== Test                             Result       Flag       Units Drug Present and Declared for Prescription Verification   Oxycodone                      260          EXPECTED   ng/mg creat   Oxymorphone                    52           EXPECTED   ng/mg creat   Noroxycodone                   991          EXPECTED   ng/mg creat    Sources of oxycodone include scheduled prescription medications.    Oxymorphone and noroxycodone are expected metabolites of    oxycodone. Oxymorphone is also available as a scheduled    prescription medication. Drug Absent but Declared for Prescription Verification   Codeine  Not Detected UNEXPECTED ng/mg creat   Butalbital                     Not Detected UNEXPECTED ==================================================================== Test                      Result    Flag   Units      Ref Range   Creatinine              203              mg/dL      >=20 ==================================================================== Declared Medications:  The flagging and interpretation on this report are based on the  following declared medications.  Unexpected results may arise from  inaccuracies in the declared medications.  **Note: The testing scope of this panel includes these  medications:  Butalbital (Butalbital/APAP/Caffeine)  Codeine  Oxycodone  **Note: The testing scope of this panel does not include following  reported medications:  Acetaminophen  Acetaminophen (Butalbital/APAP/Caffeine)  Acyclovir  Albuterol  Albuterol (Ipratropium-Albuterol)  Amitriptyline  Caffeine (Butalbital/APAP/Caffeine)  Clotrimazole  Cyanocobalamin  Diphenhydramine  Epinephrine  Estradiol  Famotidine  Fluconazole  Gabapentin  Guaifenesin  Ipratropium (Ipratropium-Albuterol)  Iron (Ferrous Sulfate)  Ketoconazole  Ketorolac  Levocetirizine  Levofloxacin  Levothyroxine  Magnesium  Magnesium Oxide  Meclizine  Medroxyprogesterone (Depo-Provera)  Metformin  Metoprolol  Montelukast  Naloxone  Nitrofurantoin (Macrobid)  Omeprazole  Ondansetron  Phentermine  Pioglitazone  Quetiapine  Ranitidine  Rosuvastatin  Semaglutide (Ozempic)  Sertraline  Spironolactone  Tizanidine  Triamcinolone  Vitamin B (Biotin)  Vitamin C  Vitamin D2 (Ergocalciferol)  Zolpidem  Zonisamide (Zonegran) ==================================================================== For clinical consultation, please call [phone]. ====================================================================    Note: Above Lab results reviewed.  Recent imaging  [place] EKG SITE RITE If Site Rite image not attached, placement could not be confirmed due to  current cardiac rhythm. [place] EKG SITE RITE If Site Rite image not attached, placement could not be confirmed due to  current cardiac rhythm.  Assessment  Diagnoses of Fibromyalgia and Neurogenic pain were pertinent to this visit.  Plan of Care  I have changed Karen Lewis's pregabalin. I am also having her maintain her montelukast, spironolactone, metoprolol tartrate, metFORMIN, omeprazole, albuterol, acyclovir, multivitamin with minerals, ketoconazole, zonisamide, triamcinolone, pioglitazone, nitrofurantoin (macrocrystal-monohydrate),  amitriptyline, Biotin, zolpidem, vitamin C, Emollient (A + D PERSONAL CARE LOTION EX), clotrimazole, EPINEPHrine, ferrous sulfate, ondansetron, Ozempic (0.25 or 0.5 MG/DOSE), diphenhydrAMINE, famotidine, levothyroxine, oxyCODONE, doxycycline, lidocaine, lisinopril, sertraline, naloxone, oxyCODONE, oxyCODONE, magnesium oxide, QUEtiapine, imipenem-cilastatin, AMBULATORY NON FORMULARY MEDICATION, nystatin, tiZANidine, tiZANidine, levothyroxine, metoprolol tartrate, metoprolol tartrate, montelukast, and nystatin.  Pharmacotherapy (Medications Ordered): Meds ordered this encounter  Medications  . pregabalin (LYRICA) 225 MG capsule    Sig: Take 1 capsule (225 mg total) by mouth 2 (two) times daily.    Dispense:  162 capsule    Refill:  0    Fill one day early if pharmacy is closed on scheduled refill date. May substitute for generic if available.   Orders:  No orders of the defined types were placed in this encounter.  Follow-up plan:   Return for scheduled appointment.     Considering:  NOTE: NO RFA until BMI less or equal to 35.   Palliative PRN treatment(s):  Palliative bilateral intra-articular Hyalgan knee injections  Palliative bilateral genicular nerve blocks  Palliative bilateral lumbar facet blocks     Recent Visits Date Type Provider Dept  02/19/19 Office Visit Milinda Pointer, MD  [facility]  01/10/19 Office Visit Vevelyn Francois, NP [place] [facility] recent visits within past 90 days and meeting all other requirements   Today's Visits Date Type Provider Dept  03/20/19 Office Visit Milinda Pointer, MD [facility]  Showing today's visits and meeting all other requirements   Future Appointments Date Type Provider Dept  05/28/19 Appointment Milinda Pointer, MD [facility]  Showing future appointments within next 90 days and meeting all other requirements   I discussed the assessment and treatment plan with the patient.  The patient was provided an opportunity to ask questions and all were answered. The patient agreed with the plan and demonstrated an understanding of the instructions.  Patient advised to call back or seek an in-person evaluation if the symptoms or condition worsens.  Total duration of non-face-to-face encounter: 12 minutes.  Note by: Gaspar Cola, MD Date: 03/20/2019; Time: 4:21 PM  Note: This dictation was prepared with Dragon dictation. Any transcriptional errors that may result from this process are unintentional.  Disclaimer:  * Given the special circumstances of the COVID-19 pandemic, the federal government has announced that the Office for Civil Rights (OCR) will exercise its enforcement discretion and will not impose penalties on physicians using telehealth in the event of noncompliance with regulatory requirements under the [place] (HIPAA) in connection with the good faith provision of telehealth during the MVVKP-22 national public health emergency. ([place])

## 2019-03-20 NOTE — Telephone Encounter (Signed)
Pt called and stated that she was supposed to get a refill of lyrica and does not have any refills.

## 2019-03-20 NOTE — Telephone Encounter (Signed)
Routed to Dr [name].  Patient last seen 02/19/19 and given LYrica qty 206 with taper instructions

## 2019-03-22 DIAGNOSIS — E1140 Type 2 diabetes mellitus with diabetic neuropathy, unspecified: Secondary | ICD-10-CM | POA: Diagnosis not present

## 2019-03-22 DIAGNOSIS — F329 Major depressive disorder, single episode, unspecified: Secondary | ICD-10-CM | POA: Diagnosis not present

## 2019-03-22 DIAGNOSIS — G43909 Migraine, unspecified, not intractable, without status migrainosus: Secondary | ICD-10-CM | POA: Diagnosis not present

## 2019-03-22 DIAGNOSIS — A319 Mycobacterial infection, unspecified: Secondary | ICD-10-CM | POA: Diagnosis not present

## 2019-03-22 DIAGNOSIS — T8142XD Infection following a procedure, deep incisional surgical site, subsequent encounter: Secondary | ICD-10-CM | POA: Diagnosis not present

## 2019-03-22 DIAGNOSIS — M797 Fibromyalgia: Secondary | ICD-10-CM | POA: Diagnosis not present

## 2019-03-22 DIAGNOSIS — M793 Panniculitis, unspecified: Secondary | ICD-10-CM | POA: Diagnosis not present

## 2019-03-22 DIAGNOSIS — I119 Hypertensive heart disease without heart failure: Secondary | ICD-10-CM | POA: Diagnosis not present

## 2019-03-22 DIAGNOSIS — G8929 Other chronic pain: Secondary | ICD-10-CM | POA: Diagnosis not present

## 2019-03-23 DIAGNOSIS — R7881 Bacteremia: Secondary | ICD-10-CM | POA: Diagnosis not present

## 2019-03-23 DIAGNOSIS — A319 Mycobacterial infection, unspecified: Secondary | ICD-10-CM | POA: Diagnosis not present

## 2019-03-26 ENCOUNTER — Other Ambulatory Visit: Payer: Self-pay | Admitting: Pain Medicine

## 2019-03-26 ENCOUNTER — Encounter: Payer: Self-pay | Admitting: Licensed Clinical Social Worker

## 2019-03-26 DIAGNOSIS — A319 Mycobacterial infection, unspecified: Secondary | ICD-10-CM | POA: Diagnosis not present

## 2019-03-26 DIAGNOSIS — I119 Hypertensive heart disease without heart failure: Secondary | ICD-10-CM | POA: Diagnosis not present

## 2019-03-26 DIAGNOSIS — E1140 Type 2 diabetes mellitus with diabetic neuropathy, unspecified: Secondary | ICD-10-CM | POA: Diagnosis not present

## 2019-03-26 DIAGNOSIS — G8929 Other chronic pain: Secondary | ICD-10-CM | POA: Diagnosis not present

## 2019-03-26 DIAGNOSIS — M797 Fibromyalgia: Secondary | ICD-10-CM | POA: Diagnosis not present

## 2019-03-26 DIAGNOSIS — T8142XD Infection following a procedure, deep incisional surgical site, subsequent encounter: Secondary | ICD-10-CM | POA: Diagnosis not present

## 2019-03-26 DIAGNOSIS — M793 Panniculitis, unspecified: Secondary | ICD-10-CM | POA: Diagnosis not present

## 2019-03-26 DIAGNOSIS — G43909 Migraine, unspecified, not intractable, without status migrainosus: Secondary | ICD-10-CM | POA: Diagnosis not present

## 2019-03-26 DIAGNOSIS — F329 Major depressive disorder, single episode, unspecified: Secondary | ICD-10-CM | POA: Diagnosis not present

## 2019-03-26 LAB — RAPID GROWER BROTH SUSCEP.
Amikacin: 4
Ciprofloxacin: >4
Clarithromycin: >16
Doxycycline: >16
Linezolid: 8
Minocycline: >8
Moxifloxacin: 8
Tigecycline: 0.25

## 2019-03-26 LAB — AFB ORGANISM ID BY DNA PROBE
M avium complex: NEGATIVE
M gordonae: NEGATIVE
M kansasii: NEGATIVE
M tuberculosis complex: NEGATIVE

## 2019-03-26 LAB — ORG ID BY SEQUENCING RFLX AST

## 2019-03-26 NOTE — Telephone Encounter (Signed)
Thanks

## 2019-03-26 NOTE — Telephone Encounter (Signed)
Clofazimine has arrived to the [facility] for patient.  Coordinated with Tamika from [facility] ID clinic and courier service will deliver medication to her tomorrow morning. Two bottles of clofazimine (a  3 month supply) and clofazimine medication sheets were included in package.

## 2019-03-27 ENCOUNTER — Ambulatory Visit: Payer: Medicare HMO | Attending: Infectious Diseases | Admitting: Infectious Diseases

## 2019-03-27 ENCOUNTER — Other Ambulatory Visit: Payer: Self-pay

## 2019-03-27 ENCOUNTER — Ambulatory Visit: Payer: Medicare HMO | Admitting: Infectious Diseases

## 2019-03-27 ENCOUNTER — Encounter: Payer: Self-pay | Admitting: Infectious Diseases

## 2019-03-27 VITALS — BP 94/61 | HR 90 | Temp 98.1°F | Ht 64.0 in | Wt 346.0 lb

## 2019-03-27 DIAGNOSIS — G4733 Obstructive sleep apnea (adult) (pediatric): Secondary | ICD-10-CM

## 2019-03-27 DIAGNOSIS — A311 Cutaneous mycobacterial infection: Secondary | ICD-10-CM | POA: Diagnosis not present

## 2019-03-27 DIAGNOSIS — I10 Essential (primary) hypertension: Secondary | ICD-10-CM | POA: Diagnosis not present

## 2019-03-27 DIAGNOSIS — Z95828 Presence of other vascular implants and grafts: Secondary | ICD-10-CM

## 2019-03-27 DIAGNOSIS — Z888 Allergy status to other drugs, medicaments and biological substances status: Secondary | ICD-10-CM

## 2019-03-27 DIAGNOSIS — D509 Iron deficiency anemia, unspecified: Secondary | ICD-10-CM

## 2019-03-27 DIAGNOSIS — G43909 Migraine, unspecified, not intractable, without status migrainosus: Secondary | ICD-10-CM

## 2019-03-27 DIAGNOSIS — Z881 Allergy status to other antibiotic agents status: Secondary | ICD-10-CM

## 2019-03-27 DIAGNOSIS — L089 Local infection of the skin and subcutaneous tissue, unspecified: Secondary | ICD-10-CM | POA: Diagnosis not present

## 2019-03-27 DIAGNOSIS — A318 Other mycobacterial infections: Secondary | ICD-10-CM

## 2019-03-27 DIAGNOSIS — K219 Gastro-esophageal reflux disease without esophagitis: Secondary | ICD-10-CM | POA: Diagnosis not present

## 2019-03-27 DIAGNOSIS — E119 Type 2 diabetes mellitus without complications: Secondary | ICD-10-CM

## 2019-03-27 DIAGNOSIS — Z96642 Presence of left artificial hip joint: Secondary | ICD-10-CM

## 2019-03-27 DIAGNOSIS — Z91013 Allergy to seafood: Secondary | ICD-10-CM

## 2019-03-27 DIAGNOSIS — Z792 Long term (current) use of antibiotics: Secondary | ICD-10-CM

## 2019-03-27 DIAGNOSIS — R197 Diarrhea, unspecified: Secondary | ICD-10-CM

## 2019-03-27 DIAGNOSIS — B370 Candidal stomatitis: Secondary | ICD-10-CM

## 2019-03-27 DIAGNOSIS — A319 Mycobacterial infection, unspecified: Secondary | ICD-10-CM

## 2019-03-27 DIAGNOSIS — Z9884 Bariatric surgery status: Secondary | ICD-10-CM

## 2019-03-27 DIAGNOSIS — Z886 Allergy status to analgesic agent status: Secondary | ICD-10-CM

## 2019-03-27 NOTE — Patient Instructions (Addendum)
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 We have procured clofazimine for you and you will take 100mg  a day. Will stop amikacin .  Next week we can consider switching another IV to Po linezolid.  You also have sore throat and some yeast infection- saline gargles, fluconazole. Take probiotic.will do labs next week

## 2019-03-27 NOTE — Progress Notes (Signed)
NAME: Karen Lewis  DOB: 11-08-1970  MRN: [id number]  Date/Time: 03/27/2019 3:34 PM   Subjective:  Follow-up visit after starting treatment for Mycobacterium abscesses. ?Patient was started on treatment for Mycobacterium abscesses on 03/13/2019 with amikacin, imipenem 1 g every 12, tigecycline initially 25 mg twice daily and increased to 50 mg twice daily.  She has been getting the antibiotics for the last 2 weeks and she is here for follow-up.  She has been doing well.  She feels that she is got more energy now.  She still has a little discharge from the wounds on her abdomen.  She has no fever.  She has been having oral thrush and also has pain in her throat while swallowing. Karen Lewis is a 48 y.o. female has a history of diabetes mellitus, hypertension, OSA, bariatric surgery, migraine and left THA.  She had a gastric sleeve in 2013 and lost 150 pounds as she continued to have GERD and was starting to gain weight she had gastric bypass Roux-en-Y  in September 2018.  She had another 50 pound weight loss.  She underwent elective panniculectomy October 2019.  The surgical site did not heal completely with persistent discharge.  Initially the culture had Pseudomonas and MRSA and she was taking Levaquin and doxycycline and had wound VAC at one time.  She had a debridement in November December and had a wound VAC.  On Feb 07, 2019 she underwent panniculectomy because of ongoing discharge erythema and pain.  The culture from that site grew Mycobacterium abscessus.  And I saw her on 02/22/2019.  I sent another culture from the drain and that also had Mycobacterium abscesses.  The bacteria was sensitive to amikacin, intermediate to imipenem, resistant to clarithromycin and moxifloxacin.  The initial specimen was resistant to linezolid but the culture that I sent the linezolid is sensitive.    Patient was started on amikacin, imipenem and tigecycline on 03/13/2019.  Since the last time she saw me the drains have  been removed. She had a PICC line placed on 03/13/2019.  Past Medical History:  Diagnosis Date  . Anemia   . Anginal pain ([place])   . Anxiety   . Arthralgia of hip 07/29/2015  . Arthritis   . Arthritis, degenerative 07/29/2015  . Asthma   . Cephalalgia 07/25/2014  . Dependence on unknown drug ([place])    multiplt controlled drug dependence  . Depression   . Diabetes mellitus without complication ([place])   . Dysrhythmia   . Eczema   . Fibromyalgia   . Gastritis   . GERD (gastroesophageal reflux disease)   . Gonalgia 07/29/2015   Overview:  Overview:  The patient has had bilateral intra-articular Hyalgan injections done on 07/16/2014 and although she seems to do well with this type of therapy, apparently her insurance company does not want to pay for they Hyalgan. On 11/27/2014 the patient underwent a bilateral genicular nerve block with excellent results. On 01/28/2015 she had a right knee genicular radiofrequency ablatio  . Gout   . H/O cardiovascular disorder 03/10/2015  . H/O surgical procedure 12/05/2012   Overview:  LSG ([facility] - April 2013)    . H/O thyroid disease 03/10/2015  . Headache   . Herpes   . History of artificial joint 07/29/2015  . History of hiatal hernia   . Hypertension   . Hypomagnesemia   . Hypothyroidism   . LBP (low back pain) 07/29/2015  . Neuromuscular disorder ([place])   . Obesity   .  PCOS (polycystic ovarian syndrome)   . Primary osteoarthritis of both knees 07/29/2015  . Sleep apnea   . Thyroid nodule bilateral    Past Surgical History:  Procedure Laterality Date  . CARPAL TUNNEL RELEASE Bilateral   . CHOLECYSTECTOMY    . DIAGNOSTIC LAPAROSCOPY    . HIATAL HERNIA REPAIR    . JOINT REPLACEMENT Left hip  . LAPAROSCOPIC PARTIAL GASTRECTOMY    . left trigger finger    . peniculectomy N/A 07/05/2018  . ROUX-EN-Y GASTRIC BYPASS  06/03/2017  . SHOULDER ARTHROSCOPY Right   . THYROIDECTOMY N/A 11/12/2015   Procedure: THYROIDECTOMY;  Surgeon: Clyde Canterbury,  MD;  Location: [facility] ORS;  Service: ENT;  Laterality: N/A;  . TRIGGER FINGER RELEASE Right     Social History   Socioeconomic History  . Marital status: Single    Spouse name: Not on file  . Number of children: Not on file  . Years of education: Not on file  . Highest education level: Not on file  Occupational History  . Not on file  Social Needs  . Financial resource strain: Not on file  . Food insecurity    Worry: Not on file    Inability: Not on file  . Transportation needs    Medical: Not on file    Non-medical: Not on file  Tobacco Use  . Smoking status: Never Smoker  . Smokeless tobacco: Never Used  Substance and Sexual Activity  . Alcohol use: No    Alcohol/week: 0.0 standard drinks  . Drug use: No  . Sexual activity: Not Currently    Birth control/protection: Injection  Lifestyle  . Physical activity    Days per week: Not on file    Minutes per session: Not on file  . Stress: Not on file  Relationships  . Social Herbalist on phone: Not on file    Gets together: Not on file    Attends religious service: Not on file    Active member of club or organization: Not on file    Attends meetings of clubs or organizations: Not on file    Relationship status: Not on file  . Intimate partner violence    Fear of current or ex partner: Not on file    Emotionally abused: Not on file    Physically abused: Not on file    Forced sexual activity: Not on file  Other Topics Concern  . Not on file  Social History Narrative  . Not on file    Family History  Problem Relation Age of Onset  . Anxiety disorder Mother   . Depression Mother   . Alcohol abuse Mother   . Diabetes Mother   . Hypertension Mother   . Kidney cancer Mother   . Sleep apnea Mother   . Alcohol abuse Father   . Anxiety disorder Father   . Depression Father   . Post-traumatic stress disorder Father   . Kidney failure Father   . COPD Father   . Diabetes Father   . Hypertension Father   .  Sleep apnea Father   . Depression Brother   . Diabetes Brother   . Hypertension Brother   . Sleep apnea Brother   . Breast cancer Paternal Aunt   . Bladder Cancer Neg Hx   . Prostate cancer Neg Hx    Allergies  Allergen Reactions  . Bactrim [Sulfamethoxazole-Trimethoprim] Hives  . Omalizumab Itching and Hives  . Ciprofloxacin Other (See Comments)  Muscle Pain Possible myalgias. myalgia Possible myalgias. myalgia  . Shellfish Allergy Other (See Comments)    unknown  . Aspirin Other (See Comments)    GI UPSET  . Clindamycin Rash  . Motrin [Ibuprofen]     Other reaction(s): Other (See Comments) GI UPSET  . Nsaids Nausea Only and Other (See Comments)    Stomach upset Other reaction(s): Other (See Comments) Stomach upset  . Other Hives  . Sulfa Antibiotics Hives    Current Outpatient Medications  Medication Sig Dispense Refill  . ACCU-CHEK AVIVA PLUS test strip     . acyclovir (ZOVIRAX) 800 MG tablet Take 800 mg by mouth as needed. Reported on 11/12/2015    . albuterol (PROVENTIL HFA;VENTOLIN HFA) 108 (90 BASE) MCG/ACT inhaler Inhale 2 puffs into the lungs every 6 (six) hours as needed for wheezing or shortness of breath.    . AMBULATORY NON FORMULARY MEDICATION Take 100 mg by mouth daily. Medication Name: Clofazimine 100 capsule 5  . amitriptyline (ELAVIL) 100 MG tablet   3  . Biotin 10 MG CAPS     . clotrimazole (MYCELEX) 10 MG troche Take 10 mg by mouth as needed.  3  . diphenhydrAMINE (BENADRYL) 25 mg capsule Take 25 mg by mouth as needed.    . doxycycline (VIBRA-TABS) 100 MG tablet     . Emollient (A + D PERSONAL CARE LOTION EX) Apply 1 application topically as needed.    Marland Kitchen EPINEPHrine 0.3 mg/0.3 mL IJ SOAJ injection Inject 0.3 mg into the skin as needed.    . famotidine (PEPCID) 20 MG tablet Take 20 mg by mouth as needed.    . ferrous sulfate 324 (65 Fe) MG TBEC Take 324 mg by mouth as needed.    Marland Kitchen imipenem-cilastatin (PRIMAXIN) 500 MG injection Inject 1,000 mg into  the vein every 12 (twelve) hours. 1 each 0  . ketoconazole (NIZORAL) 2 % cream APPLY AS DIRECTED TO AFFECTED AREA TWICE DAILY AS NEEDED    . ketorolac (TORADOL) 10 MG tablet     . levothyroxine (SYNTHROID) 125 MCG tablet Take by mouth.    . levothyroxine (SYNTHROID) 125 MCG tablet     . lidocaine (XYLOCAINE) 2 % solution     . lisinopril (ZESTRIL) 5 MG tablet Take by mouth.    Derrill Memo ON 04/10/2019] magnesium oxide (MAG-OX) 400 MG tablet Take 1 tablet (400 mg total) by mouth 2 (two) times daily. 60 tablet 1  . metFORMIN (GLUCOPHAGE) 1000 MG tablet Take 1,000 mg by mouth 2 (two) times daily with a meal.     . metoprolol (LOPRESSOR) 50 MG tablet Take 75 mg by mouth 2 (two) times daily.     . metoprolol tartrate (LOPRESSOR) 25 MG tablet Take by mouth.    . metoprolol tartrate (LOPRESSOR) 50 MG tablet TAKE 1 AND 1/2 TABLETS TWICE DAILY    . montelukast (SINGULAIR) 10 MG tablet Take 10 mg by mouth at bedtime.     . montelukast (SINGULAIR) 10 MG tablet TAKE 1 TABLET EVERY DAY    . Multiple Vitamin (MULTIVITAMIN WITH MINERALS) TABS tablet Take 2 tablets by mouth daily. Reported on 11/12/2015    . naloxone (NARCAN) nasal spray 4 mg/0.1 mL Spray into one nostril. Repeat with second device into other nostril after 2-3 minutes if no or minimal response. 1 kit 0  . nitrofurantoin, macrocrystal-monohydrate, (MACROBID) 100 MG capsule     . nystatin (MYCOSTATIN) 100000 UNIT/ML suspension Take 5 mLs (500,000 Units total) by mouth 4 (  four) times daily. Swish and swallow 60 mL 1  . nystatin (MYCOSTATIN) 100000 UNIT/ML suspension Swish and swallow 2 mLs 4 (four) times daily    . omeprazole (PRILOSEC) 40 MG capsule Take 40 mg by mouth 2 (two) times daily.     . ondansetron (ZOFRAN-ODT) 4 MG disintegrating tablet Take 4 mg by mouth as needed.  0  . oxyCODONE (OXY IR/ROXICODONE) 5 MG immediate release tablet Take 1 tablet (5 mg total) by mouth 5 (five) times daily as needed for up to 30 days for severe pain. 150 tablet  0  . [START ON 05/10/2019] oxyCODONE (OXY IR/ROXICODONE) 5 MG immediate release tablet Take 1 tablet (5 mg total) by mouth 5 (five) times daily for 30 days. Must last 30 days 150 tablet 0  . [START ON 04/10/2019] oxyCODONE (OXY IR/ROXICODONE) 5 MG immediate release tablet Take 1 tablet (5 mg total) by mouth 5 (five) times daily for 30 days. Must last 30 days 150 tablet 0  . OZEMPIC, 0.25 OR 0.5 MG/DOSE, 2 MG/1.5ML SOPN Inject 2 mg into the skin once a week.  6  . pioglitazone (ACTOS) 15 MG tablet     . pregabalin (LYRICA) 225 MG capsule Take 1 capsule (225 mg total) by mouth 2 (two) times daily. 162 capsule 0  . QUEtiapine (SEROQUEL) 100 MG tablet Take 100 mg by mouth at bedtime.    . ranitidine (ZANTAC) 150 MG tablet take 1 tablet by oral route 2 times every day    . rosuvastatin (CRESTOR) 40 MG tablet     . sertraline (ZOLOFT) 100 MG tablet     . sertraline (ZOLOFT) 20 MG/ML concentrated solution Take by mouth.    . spironolactone (ALDACTONE) 25 MG tablet Take 37.5 mg by mouth 2 (two) times daily.     Marland Kitchen tiZANidine (ZANAFLEX) 4 MG tablet Take by mouth.    Marland Kitchen tiZANidine (ZANAFLEX) 4 MG tablet     . triamcinolone (NASACORT AQ) 55 MCG/ACT AERO nasal inhaler 2 sprays by Nasal route 2 (two) times daily.    . vitamin C (ASCORBIC ACID) 500 MG tablet Take 500 mg by mouth daily.    Marland Kitchen zolpidem (AMBIEN) 10 MG tablet Take 10 mg by mouth at bedtime as needed for sleep.    Marland Kitchen zonisamide (ZONEGRAN) 50 MG capsule Take 150 mg by mouth daily. 3 tabs at bedtime    . zonisamide (ZONEGRAN) 50 MG capsule TAKE 3 CAPSULES ONE TIME DAILY     No current facility-administered medications for this visit.      Abtx:  Anti-infectives (From admission, onward)   None      REVIEW OF SYSTEMS:  Const: negative fever, negative chills,  Eyes: negative diplopia or visual changes, negative eye pain ENT: negative coryza, has pain in her throat Resp: negative cough, hemoptysis, dyspnea Cards: negative for chest pain,  palpitations, lower extremity edema GU: negative for frequency, dysuria and hematuria GI: , diarrhea,no  bleeding, constipation Skin: negative for rash and pruritus Heme: negative for easy bruising and gum/nose bleeding MS: negative for myalgias, arthralgias, back pain and muscle weakness Neurolo:negative for headaches, dizziness, vertigo, memory problems  Psych: negative for feelings of anxiety, depression  Endocrine: No polyuria or polydipsia allergy/Immunology- negative for any medication or food allergies ?  Objective:  VITALS:  BP 94/61 (BP Location: Right Arm, Patient Position: Sitting, Cuff Size: Large)   Pulse 90   Temp 98.1 F (36.7 C) (Oral)   Ht _0  (1.626 m)   Wt Marland Kitchen)  346 lb (156.9 kg)   BMI 59.39 kg/m  PHYSICAL EXAM:  General: Alert, cooperative, in wheelchair. Head: Normocephalic, without obvious abnormality, atraumatic. Eyes: Conjunctivae clear, anicteric sclerae. Pupils are equal ENT Nares normal. No drainage or sinus tenderness. Lips, mucosa, and tongue normal.  Thrush over the tongue and posterior pharynx, no tonsillar enlargement or pustular lesions on the tonsils or posterior pharynx.    neck: Supple, symmetrical, no adenopathy, thyroid: non tender no carotid bruit and no JVD. Back: No CVA tenderness. Lungs: Clear to auscultation bilaterally. No Wheezing or Rhonchi. No rales. Heart: Regular rate and rhythm, no murmur, rub or gallop. Abdomen: Soft, surgical drain sites has very minimal discharge       extremities: Left PICC line Skin: No rashes or lesions. Or bruising Lymph: Cervical, supraclavicular normal. Neurologic: Grossly non-focal Pertinent Labs Lab Results CBC 03/26/2019 WBCs 12.4 Hemoglobin 8.2 Platelet 362 Sed rate 20.  Was 48 on 02/23/2019 Creatinine 0.9  Impression/Recommendation ? 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 Patient currently on IV amikacin, IV imipenem and IV  tigecycline. We will stop the amikacin.  We have obtained clofazimine from the pharmaceutical company and will give her 100 mg once a day. The culture of the fluid that I sent in my office on 612 came back as Mycobacterium abscesses sensitive to linezolid.  He may be able to use linezolid next week instead of tigecycline or imipenem. She will call the pharmacist  at [facility] to discuss more about the clofazimine.  Oral thrush.  Likely the cause of her sore throat as well.  I do not see any pustular lesions to suspect group A strep.  She has been on triple antibiotics and I doubt she would even get a bacterial infection. I did not think a throat swab for culture was necessary. She started taking fluconazole from the last 2 days. Tigecycline is likely the culprit of the 3. She is also taking a probiotic.  We will ask her home nurse to draw a hepatitis C and HIV test.   __Microcytic anemia likely iron deficient versus anemia of chronic disease.. she has had bariatric surgery. She will follow-up with her physicians to consider iron infusion _________________________________________________ Discussed with patient,  We will follow-up in a month's time. Note:  This document was prepared using Dragon voice recognition software and may include unintentional dictation errors.

## 2019-03-28 ENCOUNTER — Telehealth: Payer: Self-pay | Admitting: Pharmacist

## 2019-03-28 NOTE — Telephone Encounter (Signed)
Called patient to counsel her on how to take clofazimine and answer any questions that she had.  Advised her that the 2 bottles that she received is a 3 month supply. She is to take 2 capsules by mouth once daily (100 mg/day). Advised her to take it with some food to mitigate any nausea that may come with the medication.  Also advised her that it can make her more sensitive to sunlight and to wear sunscreen or be in the shade if she is outside. She takes other medications all throughout the day.  I did not note any drug-drug interactions, so I told her that she could take it ay any time of the day that she preferred but to try and take it around the same time each day.  All questions answered.  She will call me if she has any issues/questions/concerns going forward.

## 2019-03-28 NOTE — Telephone Encounter (Signed)
Appreciate your help Cassie. Thanks

## 2019-03-30 DIAGNOSIS — R7881 Bacteremia: Secondary | ICD-10-CM | POA: Diagnosis not present

## 2019-03-31 ENCOUNTER — Telehealth: Payer: Self-pay | Admitting: Infectious Disease

## 2019-03-31 NOTE — Telephone Encounter (Signed)
Patient having redness around her mouth and sore mouth and difficult that she associated with start of clofazamine  I have asked her to stop the [facility] for now and then ONLY take the tygacil and imipenem through Monday and call Dr. Delaine Lame to come up with further   This certainly could be a bad case of yeast infection but pt is on azole.

## 2019-04-02 DIAGNOSIS — E1140 Type 2 diabetes mellitus with diabetic neuropathy, unspecified: Secondary | ICD-10-CM | POA: Diagnosis not present

## 2019-04-02 DIAGNOSIS — M797 Fibromyalgia: Secondary | ICD-10-CM | POA: Diagnosis not present

## 2019-04-02 DIAGNOSIS — F329 Major depressive disorder, single episode, unspecified: Secondary | ICD-10-CM | POA: Diagnosis not present

## 2019-04-02 DIAGNOSIS — M793 Panniculitis, unspecified: Secondary | ICD-10-CM | POA: Diagnosis not present

## 2019-04-02 DIAGNOSIS — G8929 Other chronic pain: Secondary | ICD-10-CM | POA: Diagnosis not present

## 2019-04-02 DIAGNOSIS — T8142XD Infection following a procedure, deep incisional surgical site, subsequent encounter: Secondary | ICD-10-CM | POA: Diagnosis not present

## 2019-04-02 DIAGNOSIS — I119 Hypertensive heart disease without heart failure: Secondary | ICD-10-CM | POA: Diagnosis not present

## 2019-04-02 DIAGNOSIS — Z7984 Long term (current) use of oral hypoglycemic drugs: Secondary | ICD-10-CM | POA: Diagnosis not present

## 2019-04-02 DIAGNOSIS — G43909 Migraine, unspecified, not intractable, without status migrainosus: Secondary | ICD-10-CM | POA: Diagnosis not present

## 2019-04-02 NOTE — Telephone Encounter (Signed)
Spoke to patient. Even before clofazimine was started she had sore throat and some orl yeast infection- it progressed. Pt did not take any since Saturday and now she has some burning in her tongue only Will wait for another day or so before she restarts clofazimine and will oberve

## 2019-04-02 NOTE — Telephone Encounter (Signed)
Spoke with patient this morning and she states that she had redness, and some swelling around the gums. No difficulty breathing or swallowing. Patient describes her symptoms as intense burning in her mouth, it has now subsided since stopping the clofazimine 2 days ago. Patient states she did not have any rash on her body at all.

## 2019-04-04 DIAGNOSIS — E1140 Type 2 diabetes mellitus with diabetic neuropathy, unspecified: Secondary | ICD-10-CM | POA: Diagnosis not present

## 2019-04-04 DIAGNOSIS — T8142XD Infection following a procedure, deep incisional surgical site, subsequent encounter: Secondary | ICD-10-CM | POA: Diagnosis not present

## 2019-04-04 DIAGNOSIS — F329 Major depressive disorder, single episode, unspecified: Secondary | ICD-10-CM | POA: Diagnosis not present

## 2019-04-04 DIAGNOSIS — I119 Hypertensive heart disease without heart failure: Secondary | ICD-10-CM | POA: Diagnosis not present

## 2019-04-04 DIAGNOSIS — G8929 Other chronic pain: Secondary | ICD-10-CM | POA: Diagnosis not present

## 2019-04-04 DIAGNOSIS — M793 Panniculitis, unspecified: Secondary | ICD-10-CM | POA: Diagnosis not present

## 2019-04-04 DIAGNOSIS — E1159 Type 2 diabetes mellitus with other circulatory complications: Secondary | ICD-10-CM | POA: Diagnosis not present

## 2019-04-04 DIAGNOSIS — I10 Essential (primary) hypertension: Secondary | ICD-10-CM | POA: Diagnosis not present

## 2019-04-04 DIAGNOSIS — G43909 Migraine, unspecified, not intractable, without status migrainosus: Secondary | ICD-10-CM | POA: Diagnosis not present

## 2019-04-04 DIAGNOSIS — M797 Fibromyalgia: Secondary | ICD-10-CM | POA: Diagnosis not present

## 2019-04-06 DIAGNOSIS — A319 Mycobacterial infection, unspecified: Secondary | ICD-10-CM | POA: Diagnosis not present

## 2019-04-06 DIAGNOSIS — R7881 Bacteremia: Secondary | ICD-10-CM | POA: Diagnosis not present

## 2019-04-09 ENCOUNTER — Telehealth: Payer: Self-pay | Admitting: Licensed Clinical Social Worker

## 2019-04-09 DIAGNOSIS — I119 Hypertensive heart disease without heart failure: Secondary | ICD-10-CM | POA: Diagnosis not present

## 2019-04-09 DIAGNOSIS — E1140 Type 2 diabetes mellitus with diabetic neuropathy, unspecified: Secondary | ICD-10-CM | POA: Diagnosis not present

## 2019-04-09 DIAGNOSIS — M793 Panniculitis, unspecified: Secondary | ICD-10-CM | POA: Diagnosis not present

## 2019-04-09 DIAGNOSIS — T8142XD Infection following a procedure, deep incisional surgical site, subsequent encounter: Secondary | ICD-10-CM | POA: Diagnosis not present

## 2019-04-09 DIAGNOSIS — G43909 Migraine, unspecified, not intractable, without status migrainosus: Secondary | ICD-10-CM | POA: Diagnosis not present

## 2019-04-09 DIAGNOSIS — M797 Fibromyalgia: Secondary | ICD-10-CM | POA: Diagnosis not present

## 2019-04-09 DIAGNOSIS — G8929 Other chronic pain: Secondary | ICD-10-CM | POA: Diagnosis not present

## 2019-04-09 DIAGNOSIS — F329 Major depressive disorder, single episode, unspecified: Secondary | ICD-10-CM | POA: Diagnosis not present

## 2019-04-09 NOTE — Telephone Encounter (Signed)
Spoke to the patient and she states that her mouth is doing much better, a little burning when she eats but nothing  like it was. Patient also states that she wants to start Linezolid and if you can send it to her pharmacy. She wants to continue the tigecycline through Wednesday since she already has it and has paid for it. Patient states that her wound is starting to feel better, has a few sharp pains but not bad. The 2 holes are starting to heal up and are a little "crusty"

## 2019-04-11 ENCOUNTER — Telehealth: Payer: Self-pay | Admitting: Infectious Diseases

## 2019-04-11 ENCOUNTER — Other Ambulatory Visit: Payer: Self-pay | Admitting: Infectious Diseases

## 2019-04-11 ENCOUNTER — Ambulatory Visit: Payer: Medicare HMO | Admitting: Pain Medicine

## 2019-04-11 DIAGNOSIS — T887XXA Unspecified adverse effect of drug or medicament, initial encounter: Secondary | ICD-10-CM

## 2019-04-11 DIAGNOSIS — R5383 Other fatigue: Secondary | ICD-10-CM

## 2019-04-11 NOTE — Telephone Encounter (Signed)
I was returning patient's call. She c/o being very tired and exhausted. always had frequent soft stools which is slightly worse. No fevr or chills, appetitie poor She is currently on Imipenem, tigecycline and clofazamine for mycobacterium abscessus of the abdominal tissue from prior lipectomy. Labs from yesterday reveal Hb 8.1 (8.3 on 02/23/19) PLT 361 ( was 560 on 02/23/19) WBC 9.4 ( was 12.7 Cr 1 ( 0.71) ast 103 (27) ALT 74( 17) Alk pho4 147 ( 81) Tp 5.6( 7.7) Bili 0.40   She is currently on tigecycline /clofazamine and impinem- the first 2 can cause the LFTS to go up Also pt on other meds like zoloft, seroquel, spirinolactone, zanaflex,  Will do renal panel and LFTS tomorrow to recheck Asked patient to stay well hydrated Stop spirnolactone

## 2019-04-12 ENCOUNTER — Other Ambulatory Visit: Payer: Self-pay | Admitting: Infectious Diseases

## 2019-04-12 ENCOUNTER — Telehealth: Payer: Self-pay | Admitting: Licensed Clinical Social Worker

## 2019-04-12 ENCOUNTER — Other Ambulatory Visit (HOSPITAL_COMMUNITY)
Admission: RE | Admit: 2019-04-12 | Discharge: 2019-04-12 | Disposition: A | Discharge status: Home / Self Care | Payer: Medicare HMO | Source: Ambulatory Visit | Attending: Infectious Diseases | Admitting: Infectious Diseases

## 2019-04-12 DIAGNOSIS — R5383 Other fatigue: Secondary | ICD-10-CM | POA: Diagnosis not present

## 2019-04-12 DIAGNOSIS — T887XXA Unspecified adverse effect of drug or medicament, initial encounter: Secondary | ICD-10-CM | POA: Insufficient documentation

## 2019-04-12 LAB — RENAL FUNCTION PANEL
Albumin: 2.7 g/dL — ABNORMAL LOW (ref 3.5–5.0)
Anion gap: 10 (ref 5–15)
BUN: 21 mg/dL — ABNORMAL HIGH (ref 6–20)
CO2: 18 mmol/L — ABNORMAL LOW (ref 22–32)
Calcium: 7.7 mg/dL — ABNORMAL LOW (ref 8.9–10.3)
Chloride: 109 mmol/L (ref 98–111)
Creatinine, Ser: 1.1 mg/dL — ABNORMAL HIGH (ref 0.44–1.00)
GFR calc Af Amer: >60 mL/min (ref >60)
GFR calc non Af Amer: 59 mL/min — ABNORMAL LOW (ref >60)
Glucose, Bld: 152 mg/dL — ABNORMAL HIGH (ref 70–99)
Phosphorus: 4.7 mg/dL — ABNORMAL HIGH (ref 2.5–4.6)
Potassium: 4 mmol/L (ref 3.5–5.1)
Sodium: 137 mmol/L (ref 135–145)

## 2019-04-12 LAB — HEPATIC FUNCTION PANEL
ALT: 63 U/L — ABNORMAL HIGH (ref 0–44)
AST: 112 U/L — ABNORMAL HIGH (ref 15–41)
Albumin: 2.7 g/dL — ABNORMAL LOW (ref 3.5–5.0)
Alkaline Phosphatase: 120 U/L (ref 38–126)
Bilirubin, Direct: 0.1 mg/dL (ref 0.0–0.2)
Indirect Bilirubin: 0.5 mg/dL (ref 0.3–0.9)
Total Bilirubin: 0.6 mg/dL (ref 0.3–1.2)
Total Protein: 5.8 g/dL — ABNORMAL LOW (ref 6.5–8.1)

## 2019-04-12 LAB — MAGNESIUM: Magnesium: 1.6 mg/dL — ABNORMAL LOW (ref 1.7–2.4)

## 2019-04-12 NOTE — Telephone Encounter (Signed)
Called patient to notify her that her magnesium was low at 1.6. Patient takes 800 mg already and what advised by Dr. Delaine Lame to increase to 1200 mg daily. Advised patient to call back if she is not feeling any better.

## 2019-04-13 DIAGNOSIS — R7881 Bacteremia: Secondary | ICD-10-CM | POA: Diagnosis not present

## 2019-04-16 DIAGNOSIS — Z452 Encounter for adjustment and management of vascular access device: Secondary | ICD-10-CM | POA: Diagnosis not present

## 2019-04-16 DIAGNOSIS — G43909 Migraine, unspecified, not intractable, without status migrainosus: Secondary | ICD-10-CM | POA: Diagnosis not present

## 2019-04-16 DIAGNOSIS — T8142XA Infection following a procedure, deep incisional surgical site, initial encounter: Secondary | ICD-10-CM | POA: Diagnosis not present

## 2019-04-16 DIAGNOSIS — F329 Major depressive disorder, single episode, unspecified: Secondary | ICD-10-CM | POA: Diagnosis not present

## 2019-04-16 DIAGNOSIS — M793 Panniculitis, unspecified: Secondary | ICD-10-CM | POA: Diagnosis not present

## 2019-04-16 DIAGNOSIS — A319 Mycobacterial infection, unspecified: Secondary | ICD-10-CM | POA: Diagnosis not present

## 2019-04-16 DIAGNOSIS — G8929 Other chronic pain: Secondary | ICD-10-CM | POA: Diagnosis not present

## 2019-04-16 DIAGNOSIS — E1140 Type 2 diabetes mellitus with diabetic neuropathy, unspecified: Secondary | ICD-10-CM | POA: Diagnosis not present

## 2019-04-17 ENCOUNTER — Telehealth: Payer: Self-pay | Admitting: Infectious Diseases

## 2019-04-17 ENCOUNTER — Other Ambulatory Visit: Payer: Self-pay | Admitting: Infectious Diseases

## 2019-04-17 DIAGNOSIS — A318 Other mycobacterial infections: Secondary | ICD-10-CM

## 2019-04-17 DIAGNOSIS — A319 Mycobacterial infection, unspecified: Secondary | ICD-10-CM

## 2019-04-17 NOTE — Telephone Encounter (Signed)
Spoke to patient about her labs from yesterday Mil elevation in LFTS persist AST 126 ( 112 last week) ALT 82 (74 last week) Alkpo4 179 ( 147) Esr 14 Cr 1.1 Hb 9 She is on IV imipenem, tigecycline and clofazimine She takes 2662m of tylenol a day. Asked her to stop or reduce it to 1 tablet of 6537ma day. Will repeat LFTS on Friday- if increasing then would decide on stopping either tigecycline or clofazimine and checking

## 2019-04-20 ENCOUNTER — Other Ambulatory Visit (HOSPITAL_COMMUNITY)
Admission: RE | Admit: 2019-04-20 | Discharge: 2019-04-20 | Disposition: A | Discharge status: Home / Self Care | Payer: Medicare HMO | Source: Ambulatory Visit | Attending: Infectious Diseases | Admitting: Infectious Diseases

## 2019-04-20 ENCOUNTER — Other Ambulatory Visit: Payer: Self-pay

## 2019-04-20 ENCOUNTER — Telehealth: Payer: Self-pay | Admitting: Infectious Diseases

## 2019-04-20 DIAGNOSIS — A319 Mycobacterial infection, unspecified: Secondary | ICD-10-CM | POA: Insufficient documentation

## 2019-04-20 DIAGNOSIS — R5383 Other fatigue: Secondary | ICD-10-CM | POA: Diagnosis not present

## 2019-04-20 DIAGNOSIS — R7881 Bacteremia: Secondary | ICD-10-CM | POA: Diagnosis not present

## 2019-04-20 LAB — HEPATIC FUNCTION PANEL
ALT: 66 U/L — ABNORMAL HIGH (ref 0–44)
AST: 111 U/L — ABNORMAL HIGH (ref 15–41)
Albumin: 2.7 g/dL — ABNORMAL LOW (ref 3.5–5.0)
Alkaline Phosphatase: 143 U/L — ABNORMAL HIGH (ref 38–126)
Bilirubin, Direct: 0.1 mg/dL (ref 0.0–0.2)
Indirect Bilirubin: 0.4 mg/dL (ref 0.3–0.9)
Total Bilirubin: 0.5 mg/dL (ref 0.3–1.2)
Total Protein: 6 g/dL — ABNORMAL LOW (ref 6.5–8.1)

## 2019-04-20 LAB — MAGNESIUM: Magnesium: 1.6 mg/dL — ABNORMAL LOW (ref 1.7–2.4)

## 2019-04-20 NOTE — Telephone Encounter (Signed)
Spoke to her about the labs done today- AST 112, ALT 63 and alkaline phosphatase slightly up to 140 ( was 120 last week). She is etremely fatigued as well. She will hold the IV tigecycline over the weekend and labs will be done on Monday and we will decide after that. She will continue imipenem and clofazimine .

## 2019-04-23 DIAGNOSIS — M793 Panniculitis, unspecified: Secondary | ICD-10-CM | POA: Diagnosis not present

## 2019-04-23 DIAGNOSIS — T8142XA Infection following a procedure, deep incisional surgical site, initial encounter: Secondary | ICD-10-CM | POA: Diagnosis not present

## 2019-04-23 DIAGNOSIS — Z452 Encounter for adjustment and management of vascular access device: Secondary | ICD-10-CM | POA: Diagnosis not present

## 2019-04-23 DIAGNOSIS — E1140 Type 2 diabetes mellitus with diabetic neuropathy, unspecified: Secondary | ICD-10-CM | POA: Diagnosis not present

## 2019-04-23 DIAGNOSIS — A319 Mycobacterial infection, unspecified: Secondary | ICD-10-CM | POA: Diagnosis not present

## 2019-04-23 DIAGNOSIS — F329 Major depressive disorder, single episode, unspecified: Secondary | ICD-10-CM | POA: Diagnosis not present

## 2019-04-23 DIAGNOSIS — G8929 Other chronic pain: Secondary | ICD-10-CM | POA: Diagnosis not present

## 2019-04-24 DIAGNOSIS — G43909 Migraine, unspecified, not intractable, without status migrainosus: Secondary | ICD-10-CM | POA: Diagnosis not present

## 2019-04-24 DIAGNOSIS — E1140 Type 2 diabetes mellitus with diabetic neuropathy, unspecified: Secondary | ICD-10-CM | POA: Diagnosis not present

## 2019-04-24 DIAGNOSIS — T8142XA Infection following a procedure, deep incisional surgical site, initial encounter: Secondary | ICD-10-CM | POA: Diagnosis not present

## 2019-04-24 DIAGNOSIS — F329 Major depressive disorder, single episode, unspecified: Secondary | ICD-10-CM | POA: Diagnosis not present

## 2019-04-24 DIAGNOSIS — M793 Panniculitis, unspecified: Secondary | ICD-10-CM | POA: Diagnosis not present

## 2019-04-24 DIAGNOSIS — A319 Mycobacterial infection, unspecified: Secondary | ICD-10-CM | POA: Diagnosis not present

## 2019-04-24 DIAGNOSIS — G8929 Other chronic pain: Secondary | ICD-10-CM | POA: Diagnosis not present

## 2019-04-24 DIAGNOSIS — Z452 Encounter for adjustment and management of vascular access device: Secondary | ICD-10-CM | POA: Diagnosis not present

## 2019-04-25 ENCOUNTER — Telehealth: Payer: Self-pay | Admitting: Infectious Diseases

## 2019-04-25 NOTE — Telephone Encounter (Signed)
Called pat to dicuss lab results from Monday - LFTS still up- AST 173 and ALT 116 and Alkpo4 127 . She was on triple therapy for Mycobacterium abscessus of the abdominal lipectomy surgical site Had PICC placed 6/30 Started IV imipenem 1 gram Q12, Amikacin and tigecycline on 7/4 LFTS were normal We stopped amikacin on 7/15 and she started Clofazamine on 7/16. Because of burinin oral mucosa we held the clofazamine between 7/18 to 7/21. She started on 7/22. Labs from 7/28 showed abnormal LFTS and it was increasing- So decided to stop Tigecycline on 04/20/19, repeated LFTS on 8/10 which still showed increase. As above.  She is on many meds that can cause increase in LFTS- pregabalin was started round the same time as these Iv meds. The plan now is to stop clofazamine and restart 50mg  of tigecycline once a day. Will repeat labs on Monday She also c/o swelling rt leg and pain rt hip- She will check with her PCP to r/o DVT. Also chronic anemia and her HB is fluctuating- last one from 8/10 is 7.8  ( need to check her b12, folate, iron) as she has had gastric bypass She also has the abdominal surgical incision site have small dehiscence. So she will contact her surgeon. Follow up with me next Tuesday Pt in agreement of this plan

## 2019-04-26 ENCOUNTER — Ambulatory Visit
Admission: RE | Admit: 2019-04-26 | Discharge: 2019-04-26 | Disposition: A | Discharge status: Home / Self Care | Payer: Medicare HMO | Source: Ambulatory Visit | Attending: Family Medicine | Admitting: Family Medicine

## 2019-04-26 ENCOUNTER — Other Ambulatory Visit: Payer: Self-pay | Admitting: Family Medicine

## 2019-04-26 ENCOUNTER — Other Ambulatory Visit: Payer: Self-pay

## 2019-04-26 ENCOUNTER — Ambulatory Visit
Admission: RE | Admit: 2019-04-26 | Discharge: 2019-04-26 | Disposition: A | Discharge status: Home / Self Care | Payer: Medicare HMO | Source: Home / Self Care | Attending: Family Medicine | Admitting: Family Medicine

## 2019-04-26 DIAGNOSIS — M169 Osteoarthritis of hip, unspecified: Secondary | ICD-10-CM | POA: Insufficient documentation

## 2019-04-26 DIAGNOSIS — M7989 Other specified soft tissue disorders: Secondary | ICD-10-CM

## 2019-04-26 DIAGNOSIS — M1611 Unilateral primary osteoarthritis, right hip: Secondary | ICD-10-CM | POA: Diagnosis not present

## 2019-04-27 DIAGNOSIS — R7881 Bacteremia: Secondary | ICD-10-CM | POA: Diagnosis not present

## 2019-04-29 DIAGNOSIS — R7881 Bacteremia: Secondary | ICD-10-CM | POA: Diagnosis not present

## 2019-04-30 DIAGNOSIS — A319 Mycobacterial infection, unspecified: Secondary | ICD-10-CM | POA: Diagnosis not present

## 2019-04-30 DIAGNOSIS — F329 Major depressive disorder, single episode, unspecified: Secondary | ICD-10-CM | POA: Diagnosis not present

## 2019-04-30 DIAGNOSIS — M793 Panniculitis, unspecified: Secondary | ICD-10-CM | POA: Diagnosis not present

## 2019-04-30 DIAGNOSIS — T8142XA Infection following a procedure, deep incisional surgical site, initial encounter: Secondary | ICD-10-CM | POA: Diagnosis not present

## 2019-04-30 DIAGNOSIS — G8929 Other chronic pain: Secondary | ICD-10-CM | POA: Diagnosis not present

## 2019-04-30 DIAGNOSIS — Z452 Encounter for adjustment and management of vascular access device: Secondary | ICD-10-CM | POA: Diagnosis not present

## 2019-04-30 DIAGNOSIS — E1140 Type 2 diabetes mellitus with diabetic neuropathy, unspecified: Secondary | ICD-10-CM | POA: Diagnosis not present

## 2019-04-30 DIAGNOSIS — G43909 Migraine, unspecified, not intractable, without status migrainosus: Secondary | ICD-10-CM | POA: Diagnosis not present

## 2019-05-01 ENCOUNTER — Ambulatory Visit: Payer: Medicare HMO | Admitting: Infectious Diseases

## 2019-05-01 ENCOUNTER — Other Ambulatory Visit: Payer: Self-pay

## 2019-05-01 ENCOUNTER — Ambulatory Visit: Payer: Medicare HMO | Attending: Infectious Diseases | Admitting: Infectious Diseases

## 2019-05-01 VITALS — BP 100/66 | HR 91 | Temp 99.3°F

## 2019-05-01 DIAGNOSIS — D509 Iron deficiency anemia, unspecified: Secondary | ICD-10-CM

## 2019-05-01 DIAGNOSIS — Z1624 Resistance to multiple antibiotics: Secondary | ICD-10-CM

## 2019-05-01 DIAGNOSIS — Z886 Allergy status to analgesic agent status: Secondary | ICD-10-CM

## 2019-05-01 DIAGNOSIS — I10 Essential (primary) hypertension: Secondary | ICD-10-CM

## 2019-05-01 DIAGNOSIS — F329 Major depressive disorder, single episode, unspecified: Secondary | ICD-10-CM

## 2019-05-01 DIAGNOSIS — B9562 Methicillin resistant Staphylococcus aureus infection as the cause of diseases classified elsewhere: Secondary | ICD-10-CM

## 2019-05-01 DIAGNOSIS — Z881 Allergy status to other antibiotic agents status: Secondary | ICD-10-CM

## 2019-05-01 DIAGNOSIS — M25551 Pain in right hip: Secondary | ICD-10-CM

## 2019-05-01 DIAGNOSIS — K5900 Constipation, unspecified: Secondary | ICD-10-CM

## 2019-05-01 DIAGNOSIS — K219 Gastro-esophageal reflux disease without esophagitis: Secondary | ICD-10-CM

## 2019-05-01 DIAGNOSIS — Z95828 Presence of other vascular implants and grafts: Secondary | ICD-10-CM

## 2019-05-01 DIAGNOSIS — A311 Cutaneous mycobacterial infection: Secondary | ICD-10-CM

## 2019-05-01 DIAGNOSIS — A318 Other mycobacterial infections: Secondary | ICD-10-CM

## 2019-05-01 DIAGNOSIS — G4733 Obstructive sleep apnea (adult) (pediatric): Secondary | ICD-10-CM

## 2019-05-01 DIAGNOSIS — Z91013 Allergy to seafood: Secondary | ICD-10-CM

## 2019-05-01 DIAGNOSIS — L089 Local infection of the skin and subcutaneous tissue, unspecified: Secondary | ICD-10-CM

## 2019-05-01 DIAGNOSIS — F419 Anxiety disorder, unspecified: Secondary | ICD-10-CM

## 2019-05-01 DIAGNOSIS — Z96642 Presence of left artificial hip joint: Secondary | ICD-10-CM

## 2019-05-01 DIAGNOSIS — A319 Mycobacterial infection, unspecified: Secondary | ICD-10-CM

## 2019-05-01 DIAGNOSIS — Z888 Allergy status to other drugs, medicaments and biological substances status: Secondary | ICD-10-CM

## 2019-05-01 DIAGNOSIS — Z9884 Bariatric surgery status: Secondary | ICD-10-CM

## 2019-05-01 DIAGNOSIS — R197 Diarrhea, unspecified: Secondary | ICD-10-CM

## 2019-05-01 DIAGNOSIS — G43909 Migraine, unspecified, not intractable, without status migrainosus: Secondary | ICD-10-CM

## 2019-05-01 DIAGNOSIS — B965 Pseudomonas (aeruginosa) (mallei) (pseudomallei) as the cause of diseases classified elsewhere: Secondary | ICD-10-CM

## 2019-05-01 DIAGNOSIS — E11628 Type 2 diabetes mellitus with other skin complications: Secondary | ICD-10-CM | POA: Diagnosis not present

## 2019-05-01 NOTE — Patient Instructions (Addendum)
You are here for follow up for Mycobacterium abscessus of the abdominal wall. You were on imipenem, tigecycline and clofazamine but the latter caused abnormal LFTS and made you fatigued and tired. Your lfts have normalized since you stopped clofazamine- you will go back on Tigecycline 50mg  BID. As you will need You may have to go back on Amikacin as you cannot take linezolid due to SSRI. Will talk to the pharmacist and also with experts

## 2019-05-01 NOTE — Progress Notes (Signed)
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 Karen Lewis is a 48 y.o. female has a history of diabetes mellitus, hypertension, OSA, bariatric surgery, migraine and left THA.  She had a gastric sleeve in 2013 and lost 150 pounds as she continued to have GERD and was starting to gain weight she had gastric bypass Roux-en-Y  in September 2018.  She had another 50 pound weight loss.  She underwent elective panniculectomy October 2019.  The surgical site did not heal completely with persistent discharge.  Initially the culture had Pseudomonas and MRSA and she was taking Levaquin and doxycycline and had wound VAC at one time.  She had a debridement in November December and had a wound VAC.  On Feb 07, 2019 she underwent panniculectomy because of ongoing discharge erythema and pain.  The culture from that site grew Mycobacterium abscessus.  And I saw her on 02/22/2019.  I sent another culture from the drain and that also had Mycobacterium abscesses.  The bacteria was sensitive to amikacin, intermediate to imipenem, resistant to clarithromycin and moxifloxacin.  The initial specimen was resistant to linezolid but the culture that I sent the linezolid is sensitive.  She had a PICC line placed on 03/13/2019.  Patient was started on amikacin, imipenem and tigecycline on 03/13/2019.  The abdominal drains were removed by the surgeon.  I saw her as follow-up on 03/28/2019 and as the clofazimine was obtained we started her on that and stop the amikacin.  After 48 hours of starting clofazimine it was put on hold because of possibility of oral sores.  It was restarted the next week on 721.  On 728 her LFTs started to climb up AST and ALT were high.  She was also feeling very tired.  Initially we held the tigecycline for 48 hours to see whether it will make a difference to to the transaminases.  As it did  not a week restarted tigecycline but stopped the clofazimine.  72 hours after stopping clofazimine the LFTs have normalized.  Now we know the clofazimine caused her abnormal LFTs and also she was increasingly fatigued. She was also complaining of swelling of the right leg and saw her primary care physician and got a Doppler on 04/26/2019 and it was negative.  She also had x-ray of the right hip which showed extensive osteoarthritis. Patient is currently on imipenem and IV tigecycline. No fever or chills Fatigue is improved. She says the the abdominal surgical site at one area there is been a collection of fluid and she used sterile scissors and opened it and now there is a small dehiscence there. She still has a lot of pain in the right hip. She has got a left total hip arthroplasty.   Past Medical History:  Diagnosis Date   Anemia    Anginal pain (HCC)    Anxiety    Arthralgia of hip 07/29/2015   Arthritis    Arthritis, degenerative 07/29/2015   Asthma    Cephalalgia 07/25/2014   Dependence on unknown drug ([place])    multiplt controlled drug dependence   Depression    Diabetes mellitus without complication (HCC)    Dysrhythmia    Eczema    Fibromyalgia    Gastritis    GERD (gastroesophageal reflux disease)    Gonalgia 07/29/2015   Overview:  Overview:  The patient has had bilateral intra-articular  Hyalgan injections done on 07/16/2014 and although she seems to do well with this type of therapy, apparently her insurance company does not want to pay for they Hyalgan. On 11/27/2014 the patient underwent a bilateral genicular nerve block with excellent results. On 01/28/2015 she had a right knee genicular radiofrequency ablatio   Gout    H/O cardiovascular disorder 03/10/2015   H/O surgical procedure 12/05/2012   Overview:  LSG ([facility] - April 2013)     H/O thyroid disease 03/10/2015   Headache    Herpes    History of artificial joint 07/29/2015   History of  hiatal hernia    Hypertension    Hypomagnesemia    Hypothyroidism    LBP (low back pain) 07/29/2015   Neuromuscular disorder (HCC)    Obesity    PCOS (polycystic ovarian syndrome)    Primary osteoarthritis of both knees 07/29/2015   Sleep apnea    Thyroid nodule bilateral    Past Surgical History:  Procedure Laterality Date   CARPAL TUNNEL RELEASE Bilateral    CHOLECYSTECTOMY     DIAGNOSTIC LAPAROSCOPY     HIATAL HERNIA REPAIR     JOINT REPLACEMENT Left hip   LAPAROSCOPIC PARTIAL GASTRECTOMY     left trigger finger     peniculectomy N/A 07/05/2018   ROUX-EN-Y GASTRIC BYPASS  06/03/2017   SHOULDER ARTHROSCOPY Right    THYROIDECTOMY N/A 11/12/2015   Procedure: THYROIDECTOMY;  Surgeon: Clyde Canterbury, MD;  Location: [facility] ORS;  Service: ENT;  Laterality: N/A;   TRIGGER FINGER RELEASE Right     Social History   Socioeconomic History   Marital status: Single    Spouse name: Not on file   Number of children: Not on file   Years of education: Not on file   Highest education level: Not on file  Occupational History   Not on file  Social Needs   Financial resource strain: Not on file   Food insecurity    Worry: Not on file    Inability: Not on file   Transportation needs    Medical: Not on file    Non-medical: Not on file  Tobacco Use   Smoking status: Never Smoker   Smokeless tobacco: Never Used  Substance and Sexual Activity   Alcohol use: No    Alcohol/week: 0.0 standard drinks   Drug use: No   Sexual activity: Not Currently    Birth control/protection: Injection  Lifestyle   Physical activity    Days per week: Not on file    Minutes per session: Not on file   Stress: Not on file  Relationships   Social connections    Talks on phone: Not on file    Gets together: Not on file    Attends religious service: Not on file    Active member of club or organization: Not on file    Attends meetings of clubs or organizations: Not on  file    Relationship status: Not on file   Intimate partner violence    Fear of current or ex partner: Not on file    Emotionally abused: Not on file    Physically abused: Not on file    Forced sexual activity: Not on file  Other Topics Concern   Not on file  Social History Narrative   Not on file    Family History  Problem Relation Age of Onset   Anxiety disorder Mother    Depression Mother    Alcohol abuse Mother  Diabetes Mother    Hypertension Mother    Kidney cancer Mother    Sleep apnea Mother    Alcohol abuse Father    Anxiety disorder Father    Depression Father    Post-traumatic stress disorder Father    Kidney failure Father    COPD Father    Diabetes Father    Hypertension Father    Sleep apnea Father    Depression Brother    Diabetes Brother    Hypertension Brother    Sleep apnea Brother    Breast cancer Paternal Aunt    Bladder Cancer Neg Hx    Prostate cancer Neg Hx    Allergies  Allergen Reactions   Bactrim [Sulfamethoxazole-Trimethoprim] Hives   Omalizumab Itching and Hives   Ciprofloxacin Other (See Comments)    Muscle Pain Possible myalgias. myalgia Possible myalgias. myalgia   Shellfish Allergy Other (See Comments)    unknown   Aspirin Other (See Comments)    GI UPSET   Clindamycin Rash   Motrin [Ibuprofen]     Other reaction(s): Other (See Comments) GI UPSET   Nsaids Nausea Only and Other (See Comments)    Stomach upset Other reaction(s): Other (See Comments) Stomach upset   Other Hives   Sulfa Antibiotics Hives    Current Outpatient Medications  Medication Sig Dispense Refill   ACCU-CHEK AVIVA PLUS test strip      acyclovir (ZOVIRAX) 800 MG tablet Take 800 mg by mouth as needed. Reported on 11/12/2015     albuterol (PROVENTIL HFA;VENTOLIN HFA) 108 (90 BASE) MCG/ACT inhaler Inhale 2 puffs into the lungs every 6 (six) hours as needed for wheezing or shortness of breath.     AMBULATORY NON  FORMULARY MEDICATION Take 100 mg by mouth daily. Medication Name: Clofazimine 100 capsule 5   amitriptyline (ELAVIL) 100 MG tablet   3   Biotin 10 MG CAPS      diphenhydrAMINE (BENADRYL) 25 mg capsule Take 25 mg by mouth as needed.     Emollient (A + D PERSONAL CARE LOTION EX) Apply 1 application topically as needed.     EPINEPHrine 0.3 mg/0.3 mL IJ SOAJ injection Inject 0.3 mg into the skin as needed.     imipenem-cilastatin (PRIMAXIN) 500 MG injection Inject 1,000 mg into the vein every 12 (twelve) hours. 1 each 0   ketoconazole (NIZORAL) 2 % cream APPLY AS DIRECTED TO AFFECTED AREA TWICE DAILY AS NEEDED     levothyroxine (SYNTHROID) 125 MCG tablet Take by mouth.     levothyroxine (SYNTHROID) 125 MCG tablet      lidocaine (XYLOCAINE) 2 % solution      lisinopril (ZESTRIL) 5 MG tablet Take by mouth.     magnesium oxide (MAG-OX) 400 MG tablet Take 1 tablet (400 mg total) by mouth 2 (two) times daily. 60 tablet 1   metFORMIN (GLUCOPHAGE) 1000 MG tablet Take 1,000 mg by mouth 2 (two) times daily with a meal.      metoprolol (LOPRESSOR) 50 MG tablet Take 75 mg by mouth 2 (two) times daily.      metoprolol tartrate (LOPRESSOR) 50 MG tablet TAKE 1 AND 1/2 TABLETS TWICE DAILY     montelukast (SINGULAIR) 10 MG tablet Take 10 mg by mouth at bedtime.      montelukast (SINGULAIR) 10 MG tablet TAKE 1 TABLET EVERY DAY     naloxone (NARCAN) nasal spray 4 mg/0.1 mL Spray into one nostril. Repeat with second device into other nostril after 2-3 minutes if no  or minimal response. 1 kit 0   omeprazole (PRILOSEC) 40 MG capsule Take 40 mg by mouth 2 (two) times daily.      ondansetron (ZOFRAN-ODT) 4 MG disintegrating tablet Take 4 mg by mouth as needed.  0   [START ON 05/10/2019] oxyCODONE (OXY IR/ROXICODONE) 5 MG immediate release tablet Take 1 tablet (5 mg total) by mouth 5 (five) times daily for 30 days. Must last 30 days 150 tablet 0   oxyCODONE (OXY IR/ROXICODONE) 5 MG immediate release  tablet Take 1 tablet (5 mg total) by mouth 5 (five) times daily for 30 days. Must last 30 days 150 tablet 0   pregabalin (LYRICA) 225 MG capsule Take 1 capsule (225 mg total) by mouth 2 (two) times daily. 162 capsule 0   QUEtiapine (SEROQUEL) 100 MG tablet Take 100 mg by mouth at bedtime.     rosuvastatin (CRESTOR) 40 MG tablet      sertraline (ZOLOFT) 100 MG tablet      sertraline (ZOLOFT) 20 MG/ML concentrated solution Take by mouth.     tiZANidine (ZANAFLEX) 4 MG tablet Take by mouth.     tiZANidine (ZANAFLEX) 4 MG tablet      triamcinolone (NASACORT AQ) 55 MCG/ACT AERO nasal inhaler 2 sprays by Nasal route 2 (two) times daily.     vitamin C (ASCORBIC ACID) 500 MG tablet Take 500 mg by mouth daily.     zolpidem (AMBIEN) 10 MG tablet Take 10 mg by mouth at bedtime as needed for sleep.     zonisamide (ZONEGRAN) 50 MG capsule Take 150 mg by mouth daily. 3 tabs at bedtime     zonisamide (ZONEGRAN) 50 MG capsule TAKE 3 CAPSULES ONE TIME DAILY     clotrimazole (MYCELEX) 10 MG troche Take 10 mg by mouth as needed.  3   doxycycline (VIBRA-TABS) 100 MG tablet      famotidine (PEPCID) 20 MG tablet Take 20 mg by mouth as needed.     ferrous sulfate 324 (65 Fe) MG TBEC Take 324 mg by mouth as needed.     ketorolac (TORADOL) 10 MG tablet      nystatin (MYCOSTATIN) 100000 UNIT/ML suspension Take 5 mLs (500,000 Units total) by mouth 4 (four) times daily. Swish and swallow (Patient not taking: Reported on 05/01/2019) 60 mL 1   nystatin (MYCOSTATIN) 100000 UNIT/ML suspension Swish and swallow 2 mLs 4 (four) times daily     oxyCODONE (OXY IR/ROXICODONE) 5 MG immediate release tablet Take 1 tablet (5 mg total) by mouth 5 (five) times daily as needed for up to 30 days for severe pain. 150 tablet 0   OZEMPIC, 0.25 OR 0.5 MG/DOSE, 2 MG/1.5ML SOPN Inject 2 mg into the skin once a week.  6   pioglitazone (ACTOS) 15 MG tablet      ranitidine (ZANTAC) 150 MG tablet take 1 tablet by oral route 2  times every day     spironolactone (ALDACTONE) 25 MG tablet Take 37.5 mg by mouth 2 (two) times daily.      No current facility-administered medications for this visit.      Abtx:  Anti-infectives (From admission, onward)   None      REVIEW OF SYSTEMS:  Const: negative fever, negative chills,  Eyes: negative diplopia or visual changes, negative eye pain ENT: negative coryza, has pain in her throat Resp: negative cough, hemoptysis, dyspnea Cards: negative for chest pain, palpitations, lower extremity edema GU: negative for frequency, dysuria and hematuria GI: , diarrhea,no  bleeding,  constipation Skin: As above Heme: negative for easy bruising and gum/nose bleeding MS: Right hip pain, back pain and muscle weakness Neurolo:negative for headaches, dizziness, vertigo, memory problems  Psych: anxiety, depression  Endocrine: No polyuria or polydipsia allergy/Immunology-as above ?  Objective:  VITALS:  BP 100/66 (BP Location: Right Arm, Patient Position: Sitting, Cuff Size: Large)    Pulse 91    Temp 99.3 F (37.4 C) (Oral)  PHYSICAL EXAM:  General: Alert, cooperative, in wheelchair. Head: Normocephalic, without obvious abnormality, atraumatic. Eyes: Conjunctivae clear, anicteric sclerae. Pupils are equal ENT Nares normal. No drainage or sinus tenderness. Lips, mucosa, and tongue normal.   neck: Supple, symmetrical, no adenopathy, thyroid: non tender no carotid bruit and no JVD. Back: No CVA tenderness. Lungs: Clear to auscultation bilaterally. No Wheezing or Rhonchi. No rales. Heart: Regular rate and rhythm, no murmur, rub or gallop. Abdomen: Soft,        extremities: Left PICC line Skin: No rashes or lesions. Or bruising Lymph: Cervical, supraclavicular normal. Neurologic: Grossly non-focal Pertinent Labs Lab Results From 04/30/2019 ALT 44 AST 29 Alkaline phosphatase 90 Magnesium 1.6 Bilirubin 0.3 BUN 10 Creatinine 0.8 Glucose 147 Hemoglobin 8.7 WBC  8.1 Platelet 325 Impression/Recommendation ? 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  Some discharge from the site. Patient was started on IV amikacin, IV imipenem and IV tigecycline on 03/13/2019. Amikacin was stopped on 03/28/2019 and started on clofazimine 100 mg oral..  She developed abnormal LFTs with increasing ALT and AST with fatigue and once clofazimine was discontinued the LFTs normalized.  At this moment she is only on IV imipenem and tigecycline.  May have to restart IV amikacin.  The other options or p.o. linezolid but as she takes SSRIs there will be a risk for serotonin syndrome.  Discussed with expert in [place] and they recommended amikacin.  It may be difficult to dose her because of her weight of 346 pounds. We will discuss with the pharmacist Also will explore  the possibility of taking Tidezolid   Microcytic anemia likely iron deficient versus anemia of chronic disease.. she has had bariatric surgery. _________________________________________________ Discussed with patient,  We will follow-up in 2 to 3 weeks. Note:  This document was prepared using Dragon voice recognition software and may include unintentional dictation errors.

## 2019-05-02 ENCOUNTER — Encounter: Payer: Self-pay | Admitting: Infectious Diseases

## 2019-05-02 ENCOUNTER — Other Ambulatory Visit: Payer: Self-pay | Admitting: Infectious Diseases

## 2019-05-05 DIAGNOSIS — R7881 Bacteremia: Secondary | ICD-10-CM | POA: Diagnosis not present

## 2019-05-06 ENCOUNTER — Other Ambulatory Visit: Payer: Self-pay

## 2019-05-07 ENCOUNTER — Telehealth: Payer: Self-pay | Admitting: Licensed Clinical Social Worker

## 2019-05-07 DIAGNOSIS — G43909 Migraine, unspecified, not intractable, without status migrainosus: Secondary | ICD-10-CM | POA: Diagnosis not present

## 2019-05-07 DIAGNOSIS — F329 Major depressive disorder, single episode, unspecified: Secondary | ICD-10-CM | POA: Diagnosis not present

## 2019-05-07 DIAGNOSIS — T8142XA Infection following a procedure, deep incisional surgical site, initial encounter: Secondary | ICD-10-CM | POA: Diagnosis not present

## 2019-05-07 DIAGNOSIS — Z452 Encounter for adjustment and management of vascular access device: Secondary | ICD-10-CM | POA: Diagnosis not present

## 2019-05-07 DIAGNOSIS — G8929 Other chronic pain: Secondary | ICD-10-CM | POA: Diagnosis not present

## 2019-05-07 DIAGNOSIS — A319 Mycobacterial infection, unspecified: Secondary | ICD-10-CM | POA: Diagnosis not present

## 2019-05-07 DIAGNOSIS — M793 Panniculitis, unspecified: Secondary | ICD-10-CM | POA: Diagnosis not present

## 2019-05-07 DIAGNOSIS — E1140 Type 2 diabetes mellitus with diabetic neuropathy, unspecified: Secondary | ICD-10-CM | POA: Diagnosis not present

## 2019-05-07 NOTE — Telephone Encounter (Signed)
-----   Message from Tsosie Billing, MD sent at 05/07/2019  2:33 PM EDT ----- Can you please check to see how she is doing since starting amikacin? How is the abdominal wall lesions doing? Any drainage? Did they do labs today? Any side effects from the medication? Did she go see the ENT for basic hearing test? thx

## 2019-05-07 NOTE — Telephone Encounter (Signed)
Patient states that she still has some moderate drainage, not worse and not better. Patient states her energy is a little low still. Patient has not seen the ENT but will make an appointment. Patient states that the lesions are still present, not completely closed. Patient will be sending in pictures of the wounds via mychart. If she has trouble sending to mychart she will send to my cell phone.

## 2019-05-08 ENCOUNTER — Other Ambulatory Visit: Payer: Self-pay | Admitting: Infectious Diseases

## 2019-05-09 ENCOUNTER — Telehealth: Payer: Self-pay | Admitting: Pain Medicine

## 2019-05-09 NOTE — Telephone Encounter (Signed)
We need to have her been seen virtually earlier then scheduled.  I cant figure out why 162 were ordered.  So, she needs to get her appointment changed

## 2019-05-09 NOTE — Telephone Encounter (Signed)
Patient states her meds quanity are not correct and she does not have enough to last until the next appt Sept 14. Has enough for a week and half.  Only prescibed 162 of Lyrica and she is to take 2x a day. Please call to discuss this and let her know what to do.

## 2019-05-10 ENCOUNTER — Other Ambulatory Visit: Payer: Self-pay | Admitting: Infectious Diseases

## 2019-05-10 DIAGNOSIS — G43909 Migraine, unspecified, not intractable, without status migrainosus: Secondary | ICD-10-CM | POA: Diagnosis not present

## 2019-05-10 DIAGNOSIS — T8142XA Infection following a procedure, deep incisional surgical site, initial encounter: Secondary | ICD-10-CM | POA: Diagnosis not present

## 2019-05-10 DIAGNOSIS — M793 Panniculitis, unspecified: Secondary | ICD-10-CM | POA: Diagnosis not present

## 2019-05-10 DIAGNOSIS — G8929 Other chronic pain: Secondary | ICD-10-CM | POA: Diagnosis not present

## 2019-05-10 DIAGNOSIS — E875 Hyperkalemia: Secondary | ICD-10-CM

## 2019-05-10 DIAGNOSIS — F329 Major depressive disorder, single episode, unspecified: Secondary | ICD-10-CM | POA: Diagnosis not present

## 2019-05-10 DIAGNOSIS — A319 Mycobacterial infection, unspecified: Secondary | ICD-10-CM | POA: Diagnosis not present

## 2019-05-10 DIAGNOSIS — Z452 Encounter for adjustment and management of vascular access device: Secondary | ICD-10-CM | POA: Diagnosis not present

## 2019-05-10 DIAGNOSIS — E1140 Type 2 diabetes mellitus with diabetic neuropathy, unspecified: Secondary | ICD-10-CM | POA: Diagnosis not present

## 2019-05-10 NOTE — Progress Notes (Signed)
pts lab from today had a K of 5.6- Cr was 1.  Not sure whether it was hemolyzed- she is on Amikacin, Imipenem and tigecycline for Mycobacterium abscessus of the abdominall wall. Spoke to her this evening and asked her to do labs tomorrow

## 2019-05-10 NOTE — Telephone Encounter (Signed)
Scheduled patient 05-16-19 at 3pm for [facility]

## 2019-05-12 DIAGNOSIS — R7881 Bacteremia: Secondary | ICD-10-CM | POA: Diagnosis not present

## 2019-05-12 DIAGNOSIS — A319 Mycobacterial infection, unspecified: Secondary | ICD-10-CM | POA: Diagnosis not present

## 2019-05-14 DIAGNOSIS — A319 Mycobacterial infection, unspecified: Secondary | ICD-10-CM | POA: Diagnosis not present

## 2019-05-14 DIAGNOSIS — F329 Major depressive disorder, single episode, unspecified: Secondary | ICD-10-CM | POA: Diagnosis not present

## 2019-05-14 DIAGNOSIS — G8929 Other chronic pain: Secondary | ICD-10-CM | POA: Diagnosis not present

## 2019-05-14 DIAGNOSIS — M793 Panniculitis, unspecified: Secondary | ICD-10-CM | POA: Diagnosis not present

## 2019-05-14 DIAGNOSIS — Z452 Encounter for adjustment and management of vascular access device: Secondary | ICD-10-CM | POA: Diagnosis not present

## 2019-05-14 DIAGNOSIS — E1140 Type 2 diabetes mellitus with diabetic neuropathy, unspecified: Secondary | ICD-10-CM | POA: Diagnosis not present

## 2019-05-14 DIAGNOSIS — T8142XA Infection following a procedure, deep incisional surgical site, initial encounter: Secondary | ICD-10-CM | POA: Diagnosis not present

## 2019-05-14 DIAGNOSIS — G43909 Migraine, unspecified, not intractable, without status migrainosus: Secondary | ICD-10-CM | POA: Diagnosis not present

## 2019-05-15 ENCOUNTER — Encounter: Payer: Self-pay | Admitting: Pain Medicine

## 2019-05-15 DIAGNOSIS — A319 Mycobacterial infection, unspecified: Secondary | ICD-10-CM | POA: Diagnosis not present

## 2019-05-15 DIAGNOSIS — Z452 Encounter for adjustment and management of vascular access device: Secondary | ICD-10-CM | POA: Diagnosis not present

## 2019-05-15 DIAGNOSIS — E1140 Type 2 diabetes mellitus with diabetic neuropathy, unspecified: Secondary | ICD-10-CM | POA: Diagnosis not present

## 2019-05-15 DIAGNOSIS — F329 Major depressive disorder, single episode, unspecified: Secondary | ICD-10-CM | POA: Diagnosis not present

## 2019-05-15 DIAGNOSIS — G43909 Migraine, unspecified, not intractable, without status migrainosus: Secondary | ICD-10-CM | POA: Diagnosis not present

## 2019-05-15 DIAGNOSIS — M793 Panniculitis, unspecified: Secondary | ICD-10-CM | POA: Diagnosis not present

## 2019-05-15 DIAGNOSIS — G8929 Other chronic pain: Secondary | ICD-10-CM | POA: Diagnosis not present

## 2019-05-15 DIAGNOSIS — T8142XA Infection following a procedure, deep incisional surgical site, initial encounter: Secondary | ICD-10-CM | POA: Diagnosis not present

## 2019-05-16 ENCOUNTER — Other Ambulatory Visit: Payer: Self-pay | Admitting: Infectious Diseases

## 2019-05-16 ENCOUNTER — Other Ambulatory Visit: Payer: Self-pay

## 2019-05-16 ENCOUNTER — Ambulatory Visit: Payer: Medicare HMO | Attending: Pain Medicine | Admitting: Pain Medicine

## 2019-05-16 DIAGNOSIS — M25561 Pain in right knee: Secondary | ICD-10-CM

## 2019-05-16 DIAGNOSIS — M25551 Pain in right hip: Secondary | ICD-10-CM | POA: Diagnosis not present

## 2019-05-16 DIAGNOSIS — M793 Panniculitis, unspecified: Secondary | ICD-10-CM | POA: Diagnosis not present

## 2019-05-16 DIAGNOSIS — M545 Low back pain: Secondary | ICD-10-CM

## 2019-05-16 DIAGNOSIS — D649 Anemia, unspecified: Secondary | ICD-10-CM | POA: Diagnosis not present

## 2019-05-16 DIAGNOSIS — M25562 Pain in left knee: Secondary | ICD-10-CM

## 2019-05-16 DIAGNOSIS — G894 Chronic pain syndrome: Secondary | ICD-10-CM

## 2019-05-16 DIAGNOSIS — M797 Fibromyalgia: Secondary | ICD-10-CM

## 2019-05-16 DIAGNOSIS — G8929 Other chronic pain: Secondary | ICD-10-CM | POA: Diagnosis not present

## 2019-05-16 DIAGNOSIS — M792 Neuralgia and neuritis, unspecified: Secondary | ICD-10-CM

## 2019-05-16 DIAGNOSIS — M1611 Unilateral primary osteoarthritis, right hip: Secondary | ICD-10-CM | POA: Diagnosis not present

## 2019-05-16 NOTE — Progress Notes (Addendum)
Pain Management Virtual Encounter Note - Virtual Visit via Telephone Telehealth (real-time audio visits between healthcare provider and patient).   Patient's Phone No. & Preferred Pharmacy:  [phone] (home); [phone] (mobile); (Preferred) [phone] [email or web address]  [place], [place] - [address] [place] [address] Phone: [phone] Fax: [phone]    Pre-screening note:  Our staff contacted Karen Lewis and offered her an "in person", "face-to-face" appointment versus a telephone encounter. She indicated preferring the telephone encounter, at this time.   Reason for Virtual Visit: COVID-19*  Social distancing based on CDC and AMA recommendations.   I contacted Karen Lewis on 05/16/2019 via telephone.      I clearly identified myself as Gaspar Cola, MD. I verified that I was speaking with the correct person using two identifiers (Name: Karen Lewis, and date of birth: 06/16/1971).  Advanced Informed Consent I sought verbal advanced consent from Karen Lewis for virtual visit interactions. I informed Karen Lewis of possible security and privacy concerns, risks, and limitations associated with providing "not-in-person" medical evaluation and management services. I also informed Karen Lewis of the availability of "in-person" appointments. Finally, I informed her that there would be a charge for the virtual visit and that she could be  personally, fully or partially, financially responsible for it. Karen Lewis expressed understanding and agreed to proceed.   Historic Elements   Karen Lewis is a 48 y.o. year old, female patient evaluated today after her last encounter by our practice on 05/09/2019. Karen Lewis  has a past medical history of Anemia, Anginal pain ([place]), Anxiety, Arthralgia of hip (07/29/2015), Arthritis, Arthritis, degenerative (07/29/2015), Asthma, Cephalalgia (07/25/2014), Dependence on unknown drug ([place]), Depression,  Diabetes mellitus without complication ([place]), Dysrhythmia, Eczema, Fibromyalgia, Gastritis, GERD (gastroesophageal reflux disease), Gonalgia (07/29/2015), Gout, H/O cardiovascular disorder (03/10/2015), H/O surgical procedure (12/05/2012), H/O thyroid disease (03/10/2015), Headache, Herpes, History of artificial joint (07/29/2015), History of hiatal hernia, Hypertension, Hypomagnesemia, Hypothyroidism, LBP (low back pain) (07/29/2015), Neuromuscular disorder ([place]), Obesity, PCOS (polycystic ovarian syndrome), Primary osteoarthritis of both knees (07/29/2015), Sleep apnea, and Thyroid nodule (bilateral). She also  has a past surgical history that includes Laparoscopic partial gastrectomy; Shoulder arthroscopy (Right); Joint replacement (Left, hip); Carpal tunnel release (Bilateral); Diagnostic laparoscopy; Cholecystectomy; Trigger finger release (Right); Thyroidectomy (N/A, 11/12/2015); left trigger finger; Roux-en-Y Gastric Bypass (06/03/2017); Hiatal hernia repair; and peniculectomy (N/A, 07/05/2018). Karen Lewis has a current medication list which includes the following prescription(s): accu-chek aviva plus, acyclovir, albuterol, amikacin, amitriptyline, biotin, clotrimazole, diphenhydramine, emollient, epinephrine, famotidine, ferrous sulfate, fluconazole, imipenem-cilastatin, ketoconazole, levothyroxine, lisinopril, magnesium oxide, metformin, metoprolol tartrate, montelukast, naloxone, nystatin, omeprazole, ondansetron, oxycodone, oxycodone, oxycodone, ozempic (0.25 or 0.5 mg/dose), pioglitazone, pregabalin, quetiapine, rosuvastatin, sertraline, sertraline, spironolactone, tizanidine, triamcinolone, tygacil, vitamin c, vitamin d (ergocalciferol), zolpidem, and zonisamide. She  reports that she has never smoked. She has never used smokeless tobacco. She reports that she does not drink alcohol or use drugs. Karen Lewis is allergic to bactrim [sulfamethoxazole-trimethoprim]; omalizumab; ciprofloxacin; shellfish allergy;  aspirin; clindamycin; motrin [ibuprofen]; nsaids; other; and sulfa antibiotics.   HPI  Today, she is being contacted for medication management.  The patient indicates doing well with the current medication regimen. No adverse reactions or side effects reported to the medications.  She indicates currently having a flareup of her right hip pain.  Review of her x-rays show that she has severe osteoarthritis of the right hip.  The last time that we did an intra-articular injection for her  was on 07/07/2017.  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  She is being treated by infectious diseases and she is on antibiotics and apparently she will continue to be on those for approximately 3 months.  Although her set rate is going down, she still has some open wound and therefore I have informed her that we will not be doing any kind of injections until that fully heals.  Pharmacotherapy Assessment  Analgesic: Oxycodone IR 5 mg 1 tab PO 5X/day (#150/mo) (25 mg/day) MME/day:37.5 mg/day.   Monitoring: Pharmacotherapy: No side-effects or adverse reactions reported. [place] PMP: PDMP reviewed during this encounter.       Compliance: No problems identified. Effectiveness: Clinically acceptable. Plan: Refer to "POC".  UDS:  Summary  Date Value Ref Range Status  08/23/2018 FINAL  Final    Comment:    ==================================================================== TOXASSURE SELECT 13 (MW) ==================================================================== Test                             Result       Flag       Units Drug Present and Declared for Prescription Verification   Oxycodone                      260          EXPECTED   ng/mg creat   Oxymorphone                    52           EXPECTED   ng/mg creat   Noroxycodone                   991          EXPECTED   ng/mg creat    Sources of oxycodone include scheduled  prescription medications.    Oxymorphone and noroxycodone are expected metabolites of    oxycodone. Oxymorphone is also available as a scheduled    prescription medication. Drug Absent but Declared for Prescription Verification   Codeine                        Not Detected UNEXPECTED ng/mg creat   Butalbital                     Not Detected UNEXPECTED ==================================================================== Test                      Result    Flag   Units      Ref Range   Creatinine              203              mg/dL      >=20 ==================================================================== Declared Medications:  The flagging and interpretation on this report are based on the  following declared medications.  Unexpected results may arise from  inaccuracies in the declared medications.  **Note: The testing scope of this panel includes these medications:  Butalbital (Butalbital/APAP/Caffeine)  Codeine  Oxycodone  **Note: The testing scope of this panel does not include following  reported medications:  Acetaminophen  Acetaminophen (Butalbital/APAP/Caffeine)  Acyclovir  Albuterol  Albuterol (Ipratropium-Albuterol)  Amitriptyline  Caffeine (Butalbital/APAP/Caffeine)  Clotrimazole  Cyanocobalamin  Diphenhydramine  Epinephrine  Estradiol  Famotidine  Fluconazole  Gabapentin  Guaifenesin  Ipratropium (  Ipratropium-Albuterol)  Iron (Ferrous Sulfate)  Ketoconazole  Ketorolac  Levocetirizine  Levofloxacin  Levothyroxine  Magnesium  Magnesium Oxide  Meclizine  Medroxyprogesterone (Depo-Provera)  Metformin  Metoprolol  Montelukast  Naloxone  Nitrofurantoin (Macrobid)  Omeprazole  Ondansetron  Phentermine  Pioglitazone  Quetiapine  Ranitidine  Rosuvastatin  Semaglutide (Ozempic)  Sertraline  Spironolactone  Tizanidine  Triamcinolone  Vitamin B (Biotin)  Vitamin C  Vitamin D2 (Ergocalciferol)  Zolpidem  Zonisamide  (Zonegran) ==================================================================== For clinical consultation, please call [phone]. ====================================================================    Laboratory Chemistry Profile (12 mo)  Renal: 04/12/2019: BUN 21; Creatinine, Ser 1.10  Lab Results  Component Value Date   GFRAA >60 04/12/2019   GFRNONAA 59 (L) 04/12/2019   Hepatic: 04/20/2019: Albumin 2.7 Lab Results  Component Value Date   AST 111 (H) 04/20/2019   ALT 66 (H) 04/20/2019   Other: 02/23/2019: CRP 0.9; Sed Rate 73 Note: Above Lab results reviewed.  Imaging  Last 90 days:  US Venous Img Lower Unilateral Right  Result Date: 04/26/2019 CLINICAL DATA:  48 year old female with right lower extremity swelling EXAM: RIGHT LOWER EXTREMITY VENOUS DOPPLER ULTRASOUND TECHNIQUE: Gray-scale sonography with graded compression, as well as color Doppler and duplex ultrasound were performed to evaluate the lower extremity deep venous systems from the level of the common femoral vein and including the common femoral, femoral, profunda femoral, popliteal and calf veins including the posterior tibial, peroneal and gastrocnemius veins when visible. The superficial great saphenous vein was also interrogated. Spectral Doppler was utilized to evaluate flow at rest and with distal augmentation maneuvers in the common femoral, femoral and popliteal veins. COMPARISON:  None. FINDINGS: Contralateral Common Femoral Vein: Respiratory phasicity is normal and symmetric with the symptomatic side. No evidence of thrombus. Normal compressibility. Common Femoral Vein: No evidence of thrombus. Normal compressibility, respiratory phasicity and response to augmentation. Saphenofemoral Junction: No evidence of thrombus. Normal compressibility and flow on color Doppler imaging. Profunda Femoral Vein: No evidence of thrombus. Normal compressibility and flow on color Doppler imaging. Femoral Vein: No evidence of  thrombus. Normal compressibility, respiratory phasicity and response to augmentation. Popliteal Vein: No evidence of thrombus. Normal compressibility, respiratory phasicity and response to augmentation. Calf Veins: No evidence of thrombus. Normal compressibility and flow on color Doppler imaging. Superficial Great Saphenous Vein: No evidence of thrombus. Normal compressibility. Venous Reflux:  None. Other Findings:  None. IMPRESSION: No evidence of deep venous thrombosis. Electronically Signed   By: Jacqulynn Cadet M.D.   On: 04/26/2019 15:47   Dg Hip Unilat With Pelvis 2-3 Views Right  Result Date: 04/27/2019 CLINICAL DATA:  Osteoarthritis. EXAM: DG HIP (WITH OR WITHOUT PELVIS) 2-3V RIGHT COMPARISON:  CT abdomen 01/10/2019.Right hip series 06/17/2014. FINDINGS: IVC filter noted over the lower abdomen. Degenerative changes lumbar spine. Severe degenerative changes right hip. Total left hip replacement. No interim change from prior exams. No acute abnormality. No evidence of fracture dislocation. Vascular calcifications in the pelvis. IMPRESSION: Degenerative changes lumbar spine. Severe degenerative changes right hip. Total left hip replacement. No acute abnormality. Exam stable from prior exams. Electronically Signed   By: Marcello Moores  Register   On: 04/27/2019 08:22   [place] Ekg Site Rite  Result Date: 03/13/2019 If Site Rite image not attached, placement could not be confirmed due to current cardiac rhythm.  [place] Ekg Site Rite  Result Date: 03/13/2019 If Site Rite image not attached, placement could not be confirmed due to current cardiac rhythm.   Assessment  The primary encounter diagnosis was Chronic pain syndrome.  Diagnoses of Chronic knee pain (Primary Area of Pain) (Bilateral) (R>L), Chronic hip pain (Secondary area of Pain) (Right), Chronic low back pain (Third area of Pain) (Bilateral) (R>L), Fibromyalgia, Neurogenic pain, and Hypomagnesemia were also pertinent to this visit.  Plan of Care  I  have discontinued Karen Lewis's oxyCODONE, doxycycline, lidocaine, oxyCODONE, AMBULATORY NON FORMULARY MEDICATION, ketorolac, ranitidine, and sodium chloride. I have also changed her oxyCODONE. Additionally, I am having her start on oxyCODONE and oxyCODONE. Lastly, I am having her maintain her montelukast, spironolactone, metFORMIN, omeprazole, albuterol, acyclovir, ketoconazole, zonisamide, triamcinolone, pioglitazone, amitriptyline, Biotin, zolpidem, vitamin C, Emollient (A + D PERSONAL CARE LOTION EX), clotrimazole, EPINEPHrine, ferrous sulfate, ondansetron, Ozempic (0.25 or 0.5 MG/DOSE), diphenhydrAMINE, famotidine, levothyroxine, lisinopril, sertraline, naloxone, QUEtiapine, imipenem-cilastatin, tiZANidine, metoprolol tartrate, nystatin, Accu-Chek Aviva Plus, rosuvastatin, sertraline, amikacin, Tygacil, Vitamin D (Ergocalciferol), fluconazole, pregabalin, and magnesium oxide.  Pharmacotherapy (Medications Ordered): Meds ordered this encounter  Medications  . oxyCODONE (OXY IR/ROXICODONE) 5 MG immediate release tablet    Sig: Take 1 tablet (5 mg total) by mouth 5 (five) times daily. Must last 30 days    Dispense:  150 tablet    Refill:  0    Chronic Pain: STOP Act (Not applicable) Fill 1 day early if closed on refill date. Do not fill until: 06/09/2019. To last until: 07/09/2019. Avoid benzodiazepines within 8 hours of opioids  . pregabalin (LYRICA) 225 MG capsule    Sig: Take 1 capsule (225 mg total) by mouth 2 (two) times daily.    Dispense:  180 capsule    Refill:  0    Fill one day early if pharmacy is closed on scheduled refill date. May substitute for generic if available.  . magnesium oxide (MAG-OX) 400 MG tablet    Sig: Take 1 tablet (400 mg total) by mouth 2 (two) times daily.    Dispense:  180 tablet    Refill:  0    Fill one day early if pharmacy is closed on scheduled refill date. May substitute for generic if available.  Marland Kitchen oxyCODONE (OXY IR/ROXICODONE) 5 MG immediate release  tablet    Sig: Take 1 tablet (5 mg total) by mouth 5 (five) times daily. Must last 30 days    Dispense:  150 tablet    Refill:  0    Chronic Pain: STOP Act (Not applicable) Fill 1 day early if closed on refill date. Do not fill until: 07/09/2019. To last until: 08/08/2019. Avoid benzodiazepines within 8 hours of opioids  . oxyCODONE (OXY IR/ROXICODONE) 5 MG immediate release tablet    Sig: Take 1 tablet (5 mg total) by mouth 5 (five) times daily. Must last 30 days    Dispense:  150 tablet    Refill:  0    Chronic Pain: STOP Act (Not applicable) Fill 1 day early if closed on refill date. Do not fill until: 08/08/2019. To last until: 09/07/2019. Avoid benzodiazepines within 8 hours of opioids   Orders:  No orders of the defined types were placed in this encounter.  Follow-up plan:   Return in about 4 months (around 09/03/2019) for (VV), E/M, (MM).      Considering:  NOTE: NO RFA until BMI less or equal to 35. No interventional therapies until she fully heals from her Mycobacterium infection   Palliative PRN treatment(s):  Palliative bilateral intra-articular Hyalgan knee injections  Palliative bilateral genicular nerve blocks  Palliative bilateral lumbar facet blocks     Recent Visits Date Type Provider Dept  05/16/19  Office Visit Milinda Pointer, MD [facility]  03/20/19 Office Visit Milinda Pointer, MD [facility]  02/19/19 Office Visit Milinda Pointer, MD [facility]  Showing recent visits within past 90 days and meeting all other requirements   Future Appointments No visits were found meeting these conditions.  Showing future appointments within next 90 days and meeting all other requirements   I discussed the assessment and treatment plan with the patient. The patient was provided an opportunity to ask questions and all were answered. The patient agreed with the plan and demonstrated an understanding of the instructions.  Patient  advised to call back or seek an in-person evaluation if the symptoms or condition worsens.  Total duration of non-face-to-face encounter: 15 minutes.  Note by: Gaspar Cola, MD Date: 05/16/2019; Time: 6:23 PM  Note: This dictation was prepared with Dragon dictation. Any transcriptional errors that may result from this process are unintentional.  Disclaimer:  * Given the special circumstances of the COVID-19 pandemic, the federal government has announced that the Office for Civil Rights (OCR) will exercise its enforcement discretion and will not impose penalties on physicians using telehealth in the event of noncompliance with regulatory requirements under the [place] (HIPAA) in connection with the good faith provision of telehealth during the XX123456 national public health emergency. ([place])

## 2019-05-17 DIAGNOSIS — F329 Major depressive disorder, single episode, unspecified: Secondary | ICD-10-CM | POA: Diagnosis not present

## 2019-05-17 DIAGNOSIS — G43909 Migraine, unspecified, not intractable, without status migrainosus: Secondary | ICD-10-CM | POA: Diagnosis not present

## 2019-05-17 DIAGNOSIS — A319 Mycobacterial infection, unspecified: Secondary | ICD-10-CM | POA: Diagnosis not present

## 2019-05-17 DIAGNOSIS — E1140 Type 2 diabetes mellitus with diabetic neuropathy, unspecified: Secondary | ICD-10-CM | POA: Diagnosis not present

## 2019-05-17 DIAGNOSIS — G8929 Other chronic pain: Secondary | ICD-10-CM | POA: Diagnosis not present

## 2019-05-17 DIAGNOSIS — T8142XA Infection following a procedure, deep incisional surgical site, initial encounter: Secondary | ICD-10-CM | POA: Diagnosis not present

## 2019-05-17 DIAGNOSIS — M793 Panniculitis, unspecified: Secondary | ICD-10-CM | POA: Diagnosis not present

## 2019-05-17 DIAGNOSIS — Z452 Encounter for adjustment and management of vascular access device: Secondary | ICD-10-CM | POA: Diagnosis not present

## 2019-05-17 MED ORDER — OXYCODONE HCL 5 MG PO TABS: 5.0000 mg | ORAL_TABLET | Freq: Every day | ORAL | 0 refills | Status: DC

## 2019-05-17 MED ORDER — PREGABALIN 225 MG PO CAPS: 225.0000 mg | ORAL_CAPSULE | Freq: Two times a day (BID) | ORAL | 0 refills | Status: DC

## 2019-05-17 MED ORDER — MAGNESIUM OXIDE 400 MG PO TABS: 400.0000 mg | ORAL_TABLET | Freq: Two times a day (BID) | ORAL | 0 refills | Status: DC

## 2019-05-18 ENCOUNTER — Encounter: Payer: Self-pay | Admitting: Oncology

## 2019-05-18 ENCOUNTER — Telehealth: Payer: Self-pay | Admitting: Licensed Clinical Social Worker

## 2019-05-18 NOTE — Telephone Encounter (Signed)
Spoke to the patient. She had seen her PCP PA yesterday, and they thought it could be from her anemia and low albumin- She has ana ppt with Dr.Pilati her surgeon next week- told her to go to ED if it gets worse but patient has no fever or erythema

## 2019-05-18 NOTE — Telephone Encounter (Signed)
Patient called stating that her right leg is really swollen and has heat coming from it. She denies any fevers, she states that her leg is really heavy and she has to pick it up to put it in the bed. She has called her surgeon as well. She is seeing hematology/Oncology next week for her anemia and fatigue. Patient would like to know if this could be caused by her medications.

## 2019-05-18 NOTE — Progress Notes (Signed)
Patient is coming in for new patient appointment she is doing well.

## 2019-05-19 DIAGNOSIS — R7881 Bacteremia: Secondary | ICD-10-CM | POA: Diagnosis not present

## 2019-05-22 ENCOUNTER — Inpatient Hospital Stay: Payer: Medicare HMO | Attending: Oncology | Admitting: Oncology

## 2019-05-22 ENCOUNTER — Telehealth: Payer: Self-pay | Admitting: *Deleted

## 2019-05-22 ENCOUNTER — Encounter: Payer: Self-pay | Admitting: Oncology

## 2019-05-22 ENCOUNTER — Other Ambulatory Visit: Payer: Self-pay

## 2019-05-22 ENCOUNTER — Other Ambulatory Visit: Payer: Self-pay | Admitting: Licensed Clinical Social Worker

## 2019-05-22 ENCOUNTER — Other Ambulatory Visit
Admission: RE | Admit: 2019-05-22 | Discharge: 2019-05-22 | Disposition: A | Discharge status: Home / Self Care | Payer: Medicare HMO | Source: Ambulatory Visit | Attending: Infectious Diseases | Admitting: Infectious Diseases

## 2019-05-22 VITALS — BP 145/85 | HR 97 | Temp 97.1°F

## 2019-05-22 DIAGNOSIS — D729 Disorder of white blood cells, unspecified: Secondary | ICD-10-CM | POA: Diagnosis not present

## 2019-05-22 DIAGNOSIS — M7989 Other specified soft tissue disorders: Secondary | ICD-10-CM | POA: Insufficient documentation

## 2019-05-22 DIAGNOSIS — E875 Hyperkalemia: Secondary | ICD-10-CM | POA: Diagnosis not present

## 2019-05-22 DIAGNOSIS — Z803 Family history of malignant neoplasm of breast: Secondary | ICD-10-CM | POA: Diagnosis not present

## 2019-05-22 DIAGNOSIS — R5383 Other fatigue: Secondary | ICD-10-CM | POA: Diagnosis not present

## 2019-05-22 DIAGNOSIS — Z9884 Bariatric surgery status: Secondary | ICD-10-CM | POA: Insufficient documentation

## 2019-05-22 DIAGNOSIS — D508 Other iron deficiency anemias: Secondary | ICD-10-CM

## 2019-05-22 DIAGNOSIS — R0602 Shortness of breath: Secondary | ICD-10-CM | POA: Diagnosis not present

## 2019-05-22 DIAGNOSIS — T887XXA Unspecified adverse effect of drug or medicament, initial encounter: Secondary | ICD-10-CM | POA: Insufficient documentation

## 2019-05-22 DIAGNOSIS — D509 Iron deficiency anemia, unspecified: Secondary | ICD-10-CM | POA: Insufficient documentation

## 2019-05-22 LAB — RENAL FUNCTION PANEL
Albumin: 3 g/dL — ABNORMAL LOW (ref 3.5–5.0)
Anion gap: 11 (ref 5–15)
BUN: 30 mg/dL — ABNORMAL HIGH (ref 6–20)
CO2: 19 mmol/L — ABNORMAL LOW (ref 22–32)
Calcium: 7.3 mg/dL — ABNORMAL LOW (ref 8.9–10.3)
Chloride: 107 mmol/L (ref 98–111)
Creatinine, Ser: 1.22 mg/dL — ABNORMAL HIGH (ref 0.44–1.00)
GFR calc Af Amer: >60 mL/min (ref >60)
GFR calc non Af Amer: 52 mL/min — ABNORMAL LOW (ref >60)
Glucose, Bld: 94 mg/dL (ref 70–99)
Phosphorus: 5.5 mg/dL — ABNORMAL HIGH (ref 2.5–4.6)
Potassium: 5.2 mmol/L — ABNORMAL HIGH (ref 3.5–5.1)
Sodium: 137 mmol/L (ref 135–145)

## 2019-05-22 LAB — COMPREHENSIVE METABOLIC PANEL
ALT: 97 U/L — ABNORMAL HIGH (ref 0–44)
AST: 192 U/L — ABNORMAL HIGH (ref 15–41)
Albumin: 2.8 g/dL — ABNORMAL LOW (ref 3.5–5.0)
Alkaline Phosphatase: 176 U/L — ABNORMAL HIGH (ref 38–126)
Anion gap: 11 (ref 5–15)
BUN: 29 mg/dL — ABNORMAL HIGH (ref 6–20)
CO2: 19 mmol/L — ABNORMAL LOW (ref 22–32)
Calcium: 7.2 mg/dL — ABNORMAL LOW (ref 8.9–10.3)
Chloride: 107 mmol/L (ref 98–111)
Creatinine, Ser: 1.19 mg/dL — ABNORMAL HIGH (ref 0.44–1.00)
GFR calc Af Amer: >60 mL/min (ref >60)
GFR calc non Af Amer: 54 mL/min — ABNORMAL LOW (ref >60)
Glucose, Bld: 91 mg/dL (ref 70–99)
Potassium: 5.2 mmol/L — ABNORMAL HIGH (ref 3.5–5.1)
Sodium: 137 mmol/L (ref 135–145)
Total Bilirubin: 0.6 mg/dL (ref 0.3–1.2)
Total Protein: 6 g/dL — ABNORMAL LOW (ref 6.5–8.1)

## 2019-05-22 LAB — MAGNESIUM: Magnesium: 1.6 mg/dL — ABNORMAL LOW (ref 1.7–2.4)

## 2019-05-22 NOTE — Addendum Note (Signed)
Addended by: Jarrett Ables D on: 05/22/2019 12:35 PM   Modules accepted: Orders

## 2019-05-22 NOTE — Progress Notes (Signed)
Hematology/Oncology Consult note [facility] Telephone:([phone] [phone]   Patient Care Team: Idelle Crouch, MD as PCP - General (Internal Medicine)  REFERRING PROVIDER: Mortimer Fries, PA-C CHIEF COMPLAINTS/REASON FOR VISIT:  Evaluation of iron deficiency anemia  HISTORY OF PRESENTING ILLNESS:  Karen Lewis is a  48 y.o.  female with PMH listed below was seen in consultation at the request of Mortimer Fries, PA-C   for evaluation of iron deficiency anemia.   Reviewed patient's recent labs  Labs revealed anemia with hemoglobin of .   Reviewed patient's previous labs  05/16/2019 CBC showed hemoglobin 9.4, WBC 11.4, MCV 68.7, platelet count 352,000. Iron panel showed ferritin of 8, iron 34, folate 19.3, No aggravating or improving factors.  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  She follows up with ID Dr. Tama High.    Associated signs and symptoms: Patient reports fatigue.  Associated with SOB with exertion.  Denies weight loss, easy bruising, hematochezia, hemoptysis, hematuria. Context:  History of iron deficiency: Denies Rectal bleeding: Denies denies Menstrual bleeding/ Vaginal bleeding : Denies Hematemesis or hemoptysis : denies Blood in urine : denies  Last endoscopy: History of gastric sleeve.  And then history of gastric bypass on 09/21/ 2018 Fatigue: Yes.  SOB: Yes  +Chronic lower extremity swelling/lymphedema. Moved from [place] 10 years ago.  Previously worked as a Marine scientist.   Review of Systems  Constitutional: Positive for fatigue. Negative for appetite change, chills and fever.  HENT:   Negative for hearing loss and voice change.   Eyes: Negative for eye problems.  Respiratory: Positive for shortness of breath. Negative for chest tightness and cough.   Cardiovascular: Positive for leg swelling. Negative for chest pain.   Gastrointestinal: Negative for abdominal distention, abdominal pain and blood in stool.  Endocrine: Negative for hot flashes.  Genitourinary: Negative for difficulty urinating and frequency.   Musculoskeletal: Negative for arthralgias.  Skin: Negative for itching and rash.  Neurological: Negative for extremity weakness.  Hematological: Negative for adenopathy.  Psychiatric/Behavioral: Negative for confusion.    MEDICAL HISTORY:  Past Medical History:  Diagnosis Date  . Anemia   . Anginal pain ([place])   . Anxiety   . Arthralgia of hip 07/29/2015  . Arthritis   . Arthritis, degenerative 07/29/2015  . Asthma   . Cephalalgia 07/25/2014  . Dependence on unknown drug ([place])    multiplt controlled drug dependence  . Depression   . Diabetes mellitus without complication ([place])   . Dysrhythmia   . Eczema   . Fibromyalgia   . Gastritis   . GERD (gastroesophageal reflux disease)   . Gonalgia 07/29/2015   Overview:  Overview:  The patient has had bilateral intra-articular Hyalgan injections done on 07/16/2014 and although she seems to do well with this type of therapy, apparently her insurance company does not want to pay for they Hyalgan. On 11/27/2014 the patient underwent a bilateral genicular nerve block with excellent results. On 01/28/2015 she had a right knee genicular radiofrequency ablatio  . Gout   . H/O cardiovascular disorder 03/10/2015  . H/O surgical procedure 12/05/2012   Overview:  LSG ([facility] - April 2013)    . H/O thyroid disease 03/10/2015  . Headache   . Herpes   . History of artificial joint 07/29/2015  . History of hiatal hernia   . Hypertension   . Hypomagnesemia   . Hypothyroidism   .  LBP (low back pain) 07/29/2015  . Neuromuscular disorder ([place])   . Obesity   . PCOS (polycystic ovarian syndrome)   . Primary osteoarthritis of both knees 07/29/2015  . Sleep apnea   . Thyroid nodule bilateral    SURGICAL HISTORY: Past Surgical History:  Procedure Laterality  Date  . CARPAL TUNNEL RELEASE Bilateral   . CHOLECYSTECTOMY    . DIAGNOSTIC LAPAROSCOPY    . HIATAL HERNIA REPAIR    . JOINT REPLACEMENT Left hip  . LAPAROSCOPIC PARTIAL GASTRECTOMY    . left trigger finger    . peniculectomy N/A 07/05/2018  . ROUX-EN-Y GASTRIC BYPASS  06/03/2017  . SHOULDER ARTHROSCOPY Right   . THYROIDECTOMY N/A 11/12/2015   Procedure: THYROIDECTOMY;  Surgeon: Clyde Canterbury, MD;  Location: [facility] ORS;  Service: ENT;  Laterality: N/A;  . TRIGGER FINGER RELEASE Right     SOCIAL HISTORY: Social History   Socioeconomic History  . Marital status: Single    Spouse name: Not on file  . Number of children: Not on file  . Years of education: Not on file  . Highest education level: Not on file  Occupational History  . Not on file  Social Needs  . Financial resource strain: Not on file  . Food insecurity    Worry: Not on file    Inability: Not on file  . Transportation needs    Medical: Not on file    Non-medical: Not on file  Tobacco Use  . Smoking status: Never Smoker  . Smokeless tobacco: Never Used  Substance and Sexual Activity  . Alcohol use: No    Alcohol/week: 0.0 standard drinks  . Drug use: No  . Sexual activity: Not Currently    Birth control/protection: Injection  Lifestyle  . Physical activity    Days per week: Not on file    Minutes per session: Not on file  . Stress: Not on file  Relationships  . Social Herbalist on phone: Not on file    Gets together: Not on file    Attends religious service: Not on file    Active member of club or organization: Not on file    Attends meetings of clubs or organizations: Not on file    Relationship status: Not on file  . Intimate partner violence    Fear of current or ex partner: Not on file    Emotionally abused: Not on file    Physically abused: Not on file    Forced sexual activity: Not on file  Other Topics Concern  . Not on file  Social History Narrative  . Not on file    FAMILY  HISTORY: Family History  Problem Relation Age of Onset  . Anxiety disorder Mother   . Depression Mother   . Alcohol abuse Mother   . Diabetes Mother   . Hypertension Mother   . Kidney cancer Mother   . Sleep apnea Mother   . Alcohol abuse Father   . Anxiety disorder Father   . Depression Father   . Post-traumatic stress disorder Father   . Kidney failure Father   . COPD Father   . Diabetes Father   . Hypertension Father   . Sleep apnea Father   . Depression Brother   . Diabetes Brother   . Hypertension Brother   . Sleep apnea Brother   . Breast cancer Paternal Aunt   . Bladder Cancer Neg Hx   . Prostate cancer Neg Hx  ALLERGIES:  is allergic to bactrim [sulfamethoxazole-trimethoprim]; omalizumab; ciprofloxacin; shellfish allergy; aspirin; clindamycin; motrin [ibuprofen]; nsaids; other; and sulfa antibiotics.  MEDICATIONS:  Current Outpatient Medications  Medication Sig Dispense Refill  . ACCU-CHEK AVIVA PLUS test strip     . acyclovir (ZOVIRAX) 800 MG tablet Take 800 mg by mouth as needed. Reported on 11/12/2015    . albuterol (PROVENTIL HFA;VENTOLIN HFA) 108 (90 BASE) MCG/ACT inhaler Inhale 2 puffs into the lungs every 6 (six) hours as needed for wheezing or shortness of breath.    Marland Kitchen amikacin (AMIKIN) 1 GM/4ML SOLN injection     . amitriptyline (ELAVIL) 100 MG tablet   3  . Biotin 10 MG CAPS     . clotrimazole (MYCELEX) 10 MG troche Take 10 mg by mouth as needed.  3  . diphenhydrAMINE (BENADRYL) 25 mg capsule Take 25 mg by mouth as needed.    . Emollient (A + D PERSONAL CARE LOTION EX) Apply 1 application topically as needed.    Marland Kitchen EPINEPHrine 0.3 mg/0.3 mL IJ SOAJ injection Inject 0.3 mg into the skin as needed.    Marland Kitchen imipenem-cilastatin (PRIMAXIN) 500 MG injection Inject 1,000 mg into the vein every 12 (twelve) hours. 1 each 0  . ketoconazole (NIZORAL) 2 % cream APPLY AS DIRECTED TO AFFECTED AREA TWICE DAILY AS NEEDED    . levothyroxine (SYNTHROID) 125 MCG tablet Take  250 mcg by mouth.     Marland Kitchen lisinopril (ZESTRIL) 5 MG tablet Take by mouth.    Derrill Memo ON 06/09/2019] magnesium oxide (MAG-OX) 400 MG tablet Take 1 tablet (400 mg total) by mouth 2 (two) times daily. 180 tablet 0  . metFORMIN (GLUCOPHAGE) 1000 MG tablet Take 1,000 mg by mouth 2 (two) times daily with a meal.     . metoprolol tartrate (LOPRESSOR) 50 MG tablet Take 50 mg by mouth 2 (two) times daily.     . montelukast (SINGULAIR) 10 MG tablet Take 10 mg by mouth at bedtime.     . naloxone (NARCAN) nasal spray 4 mg/0.1 mL Spray into one nostril. Repeat with second device into other nostril after 2-3 minutes if no or minimal response. 1 kit 0  . nystatin (MYCOSTATIN) 100000 UNIT/ML suspension Swish and swallow 2 mLs 4 (four) times daily    . omeprazole (PRILOSEC) 40 MG capsule Take 40 mg by mouth 2 (two) times daily.     Derrill Memo ON 06/09/2019] oxyCODONE (OXY IR/ROXICODONE) 5 MG immediate release tablet Take 1 tablet (5 mg total) by mouth 5 (five) times daily. Must last 30 days 150 tablet 0  . [START ON 07/09/2019] oxyCODONE (OXY IR/ROXICODONE) 5 MG immediate release tablet Take 1 tablet (5 mg total) by mouth 5 (five) times daily. Must last 30 days 150 tablet 0  . [START ON 08/08/2019] oxyCODONE (OXY IR/ROXICODONE) 5 MG immediate release tablet Take 1 tablet (5 mg total) by mouth 5 (five) times daily. Must last 30 days 150 tablet 0  . [START ON 06/09/2019] pregabalin (LYRICA) 225 MG capsule Take 1 capsule (225 mg total) by mouth 2 (two) times daily. 180 capsule 0  . QUEtiapine (SEROQUEL) 100 MG tablet Take 100 mg by mouth at bedtime.    . rosuvastatin (CRESTOR) 40 MG tablet     . sertraline (ZOLOFT) 100 MG tablet Take 200 mg by mouth daily.     Marland Kitchen tiZANidine (ZANAFLEX) 4 MG tablet Take by mouth.    . triamcinolone (NASACORT AQ) 55 MCG/ACT AERO nasal inhaler 2 sprays by Nasal route  2 (two) times daily.    Marland Kitchen TYGACIL 50 MG injection     . vitamin C (ASCORBIC ACID) 500 MG tablet Take 500 mg by mouth daily.    .  Vitamin D, Ergocalciferol, (DRISDOL) 1.25 MG (50000 UT) CAPS capsule     . zolpidem (AMBIEN) 10 MG tablet Take 10 mg by mouth at bedtime as needed for sleep.    Marland Kitchen zonisamide (ZONEGRAN) 50 MG capsule Take 150 mg by mouth daily. 3 tabs at bedtime    . fluconazole (DIFLUCAN) 100 MG tablet     . ondansetron (ZOFRAN-ODT) 4 MG disintegrating tablet Take 4 mg by mouth as needed.  0  . spironolactone (ALDACTONE) 25 MG tablet Take 37.5 mg by mouth 2 (two) times daily.     . tigecycline (TYGACIL) 50 MG injection Inject 50 mg into the vein every 12 (twelve) hours.     No current facility-administered medications for this visit.      PHYSICAL EXAMINATION: ECOG PERFORMANCE STATUS: 2 - Symptomatic, <50% confined to bed Vitals:   05/22/19 1210  BP: (!) 145/85  Pulse: 97  Temp: (!) 97.1 F (36.2 C)   Filed Weights    Physical Exam Constitutional:      General: She is not in acute distress.    Appearance: She is obese.  HENT:     Head: Normocephalic and atraumatic.  Eyes:     General: No scleral icterus.    Pupils: Pupils are equal, round, and reactive to light.  Neck:     Musculoskeletal: Normal range of motion and neck supple.  Cardiovascular:     Rate and Rhythm: Normal rate and regular rhythm.     Heart sounds: Normal heart sounds.  Pulmonary:     Effort: Pulmonary effort is normal. No respiratory distress.     Breath sounds: No wheezing.  Abdominal:     General: Bowel sounds are normal. There is no distension.     Palpations: Abdomen is soft. There is no mass.     Tenderness: There is no abdominal tenderness.  Musculoskeletal: Normal range of motion.        General: No deformity.  Skin:    General: Skin is warm and dry.     Coloration: Skin is pale.     Findings: No erythema or rash.     Comments:  +Left upper extremity PICC line  Neurological:     Mental Status: She is alert and oriented to person, place, and time.     Cranial Nerves: No cranial nerve deficit.      Coordination: Coordination normal.  Psychiatric:        Behavior: Behavior normal.        Thought Content: Thought content normal.       CMP Latest Ref Rng & Units 05/22/2019  Glucose 70 - 99 mg/dL 91  BUN 6 - 20 mg/dL 29(H)  Creatinine 0.44 - 1.00 mg/dL 1.19(H)  Sodium 135 - 145 mmol/L 137  Potassium 3.5 - 5.1 mmol/L 5.2(H)  Chloride 98 - 111 mmol/L 107  CO2 22 - 32 mmol/L 19(L)  Calcium 8.9 - 10.3 mg/dL 7.2(L)  Total Protein 6.5 - 8.1 g/dL 6.0(L)  Total Bilirubin 0.3 - 1.2 mg/dL 0.6  Alkaline Phos 38 - 126 U/L 176(H)  AST 15 - 41 U/L 192(H)  ALT 0 - 44 U/L 97(H)   CBC Latest Ref Rng & Units 02/23/2019  WBC 4.0 - 10.5 K/uL 12.7(H)  Hemoglobin 12.0 - 15.0 g/dL 8.3(L)  Hematocrit 36.0 - 46.0 % 29.1(L)  Platelets 150 - 400 K/uL 560(H)     LABORATORY DATA:  I have reviewed the data as listed Lab Results  Component Value Date   WBC 12.7 (H) 02/23/2019   HGB 8.3 (L) 02/23/2019   HCT 29.1 (L) 02/23/2019   MCV 66.9 (L) 02/23/2019   PLT 560 (H) 02/23/2019   Recent Labs    02/23/19 1143 04/12/19 1240 04/20/19 1419 05/22/19 1445  NA 138 137  --  137  137  K 4.4 4.0  --  5.2*  5.2*  CL 105 109  --  107  107  CO2 21* 18*  --  19*  19*  GLUCOSE 120* 152*  --  91  94  BUN 11 21*  --  29*  30*  CREATININE 0.71 1.10*  --  1.19*  1.22*  CALCIUM 9.0 7.7*  --  7.2*  7.3*  GFRNONAA >60 59*  --  54*  52*  GFRAA >60 >60  --  >60  >60  PROT 7.7 5.8* 6.0* 6.0*  ALBUMIN 3.8 2.7*  2.7* 2.7* 2.8*  3.0*  AST 20 112* 111* 192*  ALT 17 63* 66* 97*  ALKPHOS 81 120 143* 176*  BILITOT 0.4 0.6 0.5 0.6  BILIDIR  --  0.1 0.1  --   IBILI  --  0.5 0.4  --    Iron/TIBC/Ferritin/ %Sat No results found for: IRON, TIBC, FERRITIN, IRONPCTSAT   US Venous Img Lower Unilateral Right  Result Date: 04/26/2019 CLINICAL DATA:  48 year old female with right lower extremity swelling EXAM: RIGHT LOWER EXTREMITY VENOUS DOPPLER ULTRASOUND TECHNIQUE: Gray-scale sonography with graded  compression, as well as color Doppler and duplex ultrasound were performed to evaluate the lower extremity deep venous systems from the level of the common femoral vein and including the common femoral, femoral, profunda femoral, popliteal and calf veins including the posterior tibial, peroneal and gastrocnemius veins when visible. The superficial great saphenous vein was also interrogated. Spectral Doppler was utilized to evaluate flow at rest and with distal augmentation maneuvers in the common femoral, femoral and popliteal veins. COMPARISON:  None. FINDINGS: Contralateral Common Femoral Vein: Respiratory phasicity is normal and symmetric with the symptomatic side. No evidence of thrombus. Normal compressibility. Common Femoral Vein: No evidence of thrombus. Normal compressibility, respiratory phasicity and response to augmentation. Saphenofemoral Junction: No evidence of thrombus. Normal compressibility and flow on color Doppler imaging. Profunda Femoral Vein: No evidence of thrombus. Normal compressibility and flow on color Doppler imaging. Femoral Vein: No evidence of thrombus. Normal compressibility, respiratory phasicity and response to augmentation. Popliteal Vein: No evidence of thrombus. Normal compressibility, respiratory phasicity and response to augmentation. Calf Veins: No evidence of thrombus. Normal compressibility and flow on color Doppler imaging. Superficial Great Saphenous Vein: No evidence of thrombus. Normal compressibility. Venous Reflux:  None. Other Findings:  None. IMPRESSION: No evidence of deep venous thrombosis. Electronically Signed   By: Jacqulynn Cadet M.D.   On: 04/26/2019 15:47   Dg Hip Unilat With Pelvis 2-3 Views Right  Result Date: 04/27/2019 CLINICAL DATA:  Osteoarthritis. EXAM: DG HIP (WITH OR WITHOUT PELVIS) 2-3V RIGHT COMPARISON:  CT abdomen 01/10/2019.Right hip series 06/17/2014. FINDINGS: IVC filter noted over the lower abdomen. Degenerative changes lumbar spine.  Severe degenerative changes right hip. Total left hip replacement. No interim change from prior exams. No acute abnormality. No evidence of fracture dislocation. Vascular calcifications in the pelvis. IMPRESSION: Degenerative changes lumbar spine. Severe degenerative changes right hip. Total  left hip replacement. No acute abnormality. Exam stable from prior exams. Electronically Signed   By: Marcello Moores  Register   On: 04/27/2019 08:22      ASSESSMENT & PLAN:  1. Microcytic anemia   2. Other iron deficiency anemia   3. Neutrophilia   I reviewed patient's blood work done at another clinic on 05/16/2019.  Patient has anemia with hemoglobin of 9.4, microcytic MCV 68.7.  Iron panel showed ferritin of 8 Increased neutrophil percentage 73.3. Folate 19.3. 02/15/2019 vitamin B12 level 741. Discussed with patient that these labs are consistent with iron deficiency. Patient has a history of gastric bypass which put her at high risk at developing iron deficiency, other vitamin deficiency as well. Recommend IV iron with Venofer 274m weekly x 4 doses. Allergy reactions/infusion reaction including anaphylactic reaction discussed with patient. Other side effects include but not limited to high blood pressure, skin rash, weight gain, leg swelling, etc. Patient voices understanding and willing to proceed.  Neutrophilia likely due to chronic infection/abcess. She is on IV antibiotics and follows up with ID.   Check cbc, iron TIBC in 8 weeks.   Orders Placed This Encounter  Procedures  . CBC with Differential/Platelet    Standing Status:   Future    Standing Expiration Date:   05/21/2020  . Iron and TIBC    Standing Status:   Future    Standing Expiration Date:   05/21/2020  . Ferritin    Standing Status:   Future    Standing Expiration Date:   05/21/2020  . Vitamin B12    Standing Status:   Future    Standing Expiration Date:   05/21/2020  . Retic Panel    Standing Status:   Future    Standing Expiration Date:    05/21/2020  . Technologist smear review    Standing Status:   Future    Standing Expiration Date:   05/21/2020    All questions were answered. The patient knows to call the clinic with any problems questions or concerns.  Cc TMortimer Fries PA-C  Return of visit: 8 weeks Thank you for this kind referral and the opportunity to participate in the care of this patient. A copy of today's note is routed to referring provider  Total face to face encounter time for this patient visit was 411m. >50% of the time was  spent in counseling and coordination of care.    ZhEarlie ServerMD, PhD Hematology Oncology CoKindred Hospital-Central [facility] Al[facility] Center LLCager- [phone]/04/2019

## 2019-05-23 ENCOUNTER — Other Ambulatory Visit: Payer: Self-pay | Admitting: Infectious Diseases

## 2019-05-23 LAB — AMIKACIN, PEAK: Amikacin Pk: 26.6 ug/mL (ref 20.0–30.0)

## 2019-05-24 DIAGNOSIS — F329 Major depressive disorder, single episode, unspecified: Secondary | ICD-10-CM | POA: Diagnosis not present

## 2019-05-24 DIAGNOSIS — A319 Mycobacterial infection, unspecified: Secondary | ICD-10-CM | POA: Diagnosis not present

## 2019-05-24 DIAGNOSIS — G43909 Migraine, unspecified, not intractable, without status migrainosus: Secondary | ICD-10-CM | POA: Diagnosis not present

## 2019-05-24 DIAGNOSIS — M793 Panniculitis, unspecified: Secondary | ICD-10-CM | POA: Diagnosis not present

## 2019-05-24 DIAGNOSIS — G8929 Other chronic pain: Secondary | ICD-10-CM | POA: Diagnosis not present

## 2019-05-24 DIAGNOSIS — E1140 Type 2 diabetes mellitus with diabetic neuropathy, unspecified: Secondary | ICD-10-CM | POA: Diagnosis not present

## 2019-05-24 DIAGNOSIS — T8142XA Infection following a procedure, deep incisional surgical site, initial encounter: Secondary | ICD-10-CM | POA: Diagnosis not present

## 2019-05-24 DIAGNOSIS — Z452 Encounter for adjustment and management of vascular access device: Secondary | ICD-10-CM | POA: Diagnosis not present

## 2019-05-25 ENCOUNTER — Telehealth: Payer: Self-pay | Admitting: Licensed Clinical Social Worker

## 2019-05-25 DIAGNOSIS — T8149XA Infection following a procedure, other surgical site, initial encounter: Secondary | ICD-10-CM | POA: Diagnosis not present

## 2019-05-25 DIAGNOSIS — M793 Panniculitis, unspecified: Secondary | ICD-10-CM | POA: Diagnosis not present

## 2019-05-25 NOTE — Telephone Encounter (Signed)
-----   Message from Tsosie Billing, MD sent at 05/25/2019 11:32 AM EDT ----- Please share the labs with her and let her know that All the labs are pretty much the same, K 5.2, Cr 1.2, AST/ALT. Also call [facility] Hawking and ask him to send 900mg  of amikacin IV instead of 1200mg . thx

## 2019-05-25 NOTE — Telephone Encounter (Signed)
Spoke to the patient and informed her of her lab results and that she will be receiving a lowered dose of her IV amikacin today. Instructions are to continue lowered dose of Amikacin through the weekend and see how her labs are on Monday. Patient verbalized understanding.

## 2019-05-26 DIAGNOSIS — A319 Mycobacterial infection, unspecified: Secondary | ICD-10-CM | POA: Diagnosis not present

## 2019-05-26 DIAGNOSIS — R7881 Bacteremia: Secondary | ICD-10-CM | POA: Diagnosis not present

## 2019-05-28 ENCOUNTER — Telehealth: Payer: Self-pay | Admitting: Infectious Diseases

## 2019-05-28 ENCOUNTER — Ambulatory Visit: Payer: Medicare HMO | Admitting: Pain Medicine

## 2019-05-28 ENCOUNTER — Telehealth: Payer: Self-pay | Admitting: Licensed Clinical Social Worker

## 2019-05-28 ENCOUNTER — Other Ambulatory Visit: Payer: Self-pay | Admitting: Oncology

## 2019-05-28 ENCOUNTER — Encounter: Payer: Self-pay | Admitting: Oncology

## 2019-05-28 DIAGNOSIS — Z452 Encounter for adjustment and management of vascular access device: Secondary | ICD-10-CM | POA: Diagnosis not present

## 2019-05-28 DIAGNOSIS — G8929 Other chronic pain: Secondary | ICD-10-CM | POA: Diagnosis not present

## 2019-05-28 DIAGNOSIS — D509 Iron deficiency anemia, unspecified: Secondary | ICD-10-CM | POA: Insufficient documentation

## 2019-05-28 DIAGNOSIS — T8142XA Infection following a procedure, deep incisional surgical site, initial encounter: Secondary | ICD-10-CM | POA: Diagnosis not present

## 2019-05-28 DIAGNOSIS — A319 Mycobacterial infection, unspecified: Secondary | ICD-10-CM | POA: Diagnosis not present

## 2019-05-28 DIAGNOSIS — M793 Panniculitis, unspecified: Secondary | ICD-10-CM | POA: Diagnosis not present

## 2019-05-28 DIAGNOSIS — F329 Major depressive disorder, single episode, unspecified: Secondary | ICD-10-CM | POA: Diagnosis not present

## 2019-05-28 DIAGNOSIS — E1140 Type 2 diabetes mellitus with diabetic neuropathy, unspecified: Secondary | ICD-10-CM | POA: Diagnosis not present

## 2019-05-28 DIAGNOSIS — G43909 Migraine, unspecified, not intractable, without status migrainosus: Secondary | ICD-10-CM | POA: Diagnosis not present

## 2019-05-28 HISTORY — DX: Iron deficiency anemia, unspecified: D50.9

## 2019-05-28 NOTE — Telephone Encounter (Signed)
Spoke to patient and to Dr.Pilati Pt has been c/o swelling rt thigh,leg  abdomen for the past 2-3 weeks. She went to her PCP and DVT was ruled out. On looking at her labs AST/ALT trending up ( Ast > ALT) compared to 6/20 albumin trending low , cr trending up She is currently on imipenem, tigecycline and amikacin Could she have liver cirrhosis and ascites?? Is it antibiotic related especially amikacin. Asked patient to hold off on all IV antibiotics- Will evaluate her tomorrow.

## 2019-05-28 NOTE — Telephone Encounter (Signed)
Patient called to see what is the next steps with her medication and what her labs were. I went over the labs results with the patient but did not interpret to her nor could I advise her if this would change her medication or not. Patient also states that her right leg is worse and she can't hardly move it. She denies redness, states there is some heat in the leg. Some drainage on the left leg from a small area described as the "size of a thumbnail" Patient denies fever. Patient states that Dr. Clemon Chambers wanted a CT scan of the leg but wanted Dr. Delaine Lame to order it because it will be at one of our local hospitals. Per Dr. Delaine Lame patient will need to ask PCP to order it.

## 2019-05-29 ENCOUNTER — Emergency Department: Payer: Medicare HMO

## 2019-05-29 ENCOUNTER — Encounter (HOSPITAL_COMMUNITY): Payer: Self-pay | Admitting: Emergency Medicine

## 2019-05-29 ENCOUNTER — Ambulatory Visit: Payer: Medicare HMO | Admitting: Infectious Diseases

## 2019-05-29 ENCOUNTER — Encounter: Payer: Self-pay | Admitting: Emergency Medicine

## 2019-05-29 ENCOUNTER — Other Ambulatory Visit: Payer: Self-pay

## 2019-05-29 ENCOUNTER — Emergency Department
Admission: EM | Admit: 2019-05-29 | Discharge: 2019-05-29 | Disposition: A | Discharge status: Home / Self Care | Payer: Medicare HMO | Attending: Emergency Medicine | Admitting: Emergency Medicine

## 2019-05-29 ENCOUNTER — Emergency Department (HOSPITAL_COMMUNITY)
Admission: EM | Admit: 2019-05-29 | Discharge: 2019-05-29 | Disposition: A | Discharge status: Home / Self Care | Payer: Medicare HMO | Attending: Emergency Medicine | Admitting: Emergency Medicine

## 2019-05-29 ENCOUNTER — Emergency Department (HOSPITAL_COMMUNITY): Payer: Medicare HMO

## 2019-05-29 DIAGNOSIS — J45909 Unspecified asthma, uncomplicated: Secondary | ICD-10-CM | POA: Diagnosis not present

## 2019-05-29 DIAGNOSIS — S92414A Nondisplaced fracture of proximal phalanx of right great toe, initial encounter for closed fracture: Secondary | ICD-10-CM | POA: Diagnosis not present

## 2019-05-29 DIAGNOSIS — W19XXXA Unspecified fall, initial encounter: Secondary | ICD-10-CM | POA: Diagnosis not present

## 2019-05-29 DIAGNOSIS — Y9301 Activity, walking, marching and hiking: Secondary | ICD-10-CM | POA: Diagnosis not present

## 2019-05-29 DIAGNOSIS — Z888 Allergy status to other drugs, medicaments and biological substances status: Secondary | ICD-10-CM

## 2019-05-29 DIAGNOSIS — N200 Calculus of kidney: Secondary | ICD-10-CM | POA: Diagnosis not present

## 2019-05-29 DIAGNOSIS — R1084 Generalized abdominal pain: Secondary | ICD-10-CM | POA: Diagnosis not present

## 2019-05-29 DIAGNOSIS — S92515A Nondisplaced fracture of proximal phalanx of left lesser toe(s), initial encounter for closed fracture: Secondary | ICD-10-CM | POA: Diagnosis not present

## 2019-05-29 DIAGNOSIS — R109 Unspecified abdominal pain: Secondary | ICD-10-CM | POA: Diagnosis not present

## 2019-05-29 DIAGNOSIS — W01198A Fall on same level from slipping, tripping and stumbling with subsequent striking against other object, initial encounter: Secondary | ICD-10-CM

## 2019-05-29 DIAGNOSIS — R509 Fever, unspecified: Secondary | ICD-10-CM | POA: Diagnosis not present

## 2019-05-29 DIAGNOSIS — Z886 Allergy status to analgesic agent status: Secondary | ICD-10-CM

## 2019-05-29 DIAGNOSIS — Z79899 Other long term (current) drug therapy: Secondary | ICD-10-CM | POA: Insufficient documentation

## 2019-05-29 DIAGNOSIS — D509 Iron deficiency anemia, unspecified: Secondary | ICD-10-CM

## 2019-05-29 DIAGNOSIS — Z882 Allergy status to sulfonamides status: Secondary | ICD-10-CM | POA: Diagnosis not present

## 2019-05-29 DIAGNOSIS — E8809 Other disorders of plasma-protein metabolism, not elsewhere classified: Secondary | ICD-10-CM | POA: Diagnosis not present

## 2019-05-29 DIAGNOSIS — Y92018 Other place in single-family (private) house as the place of occurrence of the external cause: Secondary | ICD-10-CM | POA: Diagnosis not present

## 2019-05-29 DIAGNOSIS — Z7984 Long term (current) use of oral hypoglycemic drugs: Secondary | ICD-10-CM | POA: Diagnosis not present

## 2019-05-29 DIAGNOSIS — Y999 Unspecified external cause status: Secondary | ICD-10-CM | POA: Insufficient documentation

## 2019-05-29 DIAGNOSIS — R531 Weakness: Secondary | ICD-10-CM | POA: Diagnosis not present

## 2019-05-29 DIAGNOSIS — Z881 Allergy status to other antibiotic agents status: Secondary | ICD-10-CM

## 2019-05-29 DIAGNOSIS — S299XXA Unspecified injury of thorax, initial encounter: Secondary | ICD-10-CM | POA: Diagnosis not present

## 2019-05-29 DIAGNOSIS — Z79891 Long term (current) use of opiate analgesic: Secondary | ICD-10-CM | POA: Insufficient documentation

## 2019-05-29 DIAGNOSIS — Z9884 Bariatric surgery status: Secondary | ICD-10-CM

## 2019-05-29 DIAGNOSIS — M797 Fibromyalgia: Secondary | ICD-10-CM | POA: Diagnosis not present

## 2019-05-29 DIAGNOSIS — W010XXA Fall on same level from slipping, tripping and stumbling without subsequent striking against object, initial encounter: Secondary | ICD-10-CM | POA: Insufficient documentation

## 2019-05-29 DIAGNOSIS — A311 Cutaneous mycobacterial infection: Secondary | ICD-10-CM | POA: Diagnosis not present

## 2019-05-29 DIAGNOSIS — R600 Localized edema: Secondary | ICD-10-CM | POA: Insufficient documentation

## 2019-05-29 DIAGNOSIS — E782 Mixed hyperlipidemia: Secondary | ICD-10-CM | POA: Diagnosis not present

## 2019-05-29 DIAGNOSIS — R0602 Shortness of breath: Secondary | ICD-10-CM | POA: Diagnosis not present

## 2019-05-29 DIAGNOSIS — M7989 Other specified soft tissue disorders: Secondary | ICD-10-CM | POA: Diagnosis not present

## 2019-05-29 DIAGNOSIS — Z91013 Allergy to seafood: Secondary | ICD-10-CM

## 2019-05-29 DIAGNOSIS — E1169 Type 2 diabetes mellitus with other specified complication: Secondary | ICD-10-CM | POA: Insufficient documentation

## 2019-05-29 DIAGNOSIS — I10 Essential (primary) hypertension: Secondary | ICD-10-CM

## 2019-05-29 DIAGNOSIS — R609 Edema, unspecified: Secondary | ICD-10-CM

## 2019-05-29 DIAGNOSIS — S99921A Unspecified injury of right foot, initial encounter: Secondary | ICD-10-CM | POA: Diagnosis present

## 2019-05-29 DIAGNOSIS — E119 Type 2 diabetes mellitus without complications: Secondary | ICD-10-CM

## 2019-05-29 DIAGNOSIS — Z9889 Other specified postprocedural states: Secondary | ICD-10-CM

## 2019-05-29 DIAGNOSIS — S92415A Nondisplaced fracture of proximal phalanx of left great toe, initial encounter for closed fracture: Secondary | ICD-10-CM

## 2019-05-29 DIAGNOSIS — G4733 Obstructive sleep apnea (adult) (pediatric): Secondary | ICD-10-CM

## 2019-05-29 DIAGNOSIS — E890 Postprocedural hypothyroidism: Secondary | ICD-10-CM | POA: Diagnosis not present

## 2019-05-29 DIAGNOSIS — R52 Pain, unspecified: Secondary | ICD-10-CM | POA: Diagnosis not present

## 2019-05-29 DIAGNOSIS — Z8614 Personal history of Methicillin resistant Staphylococcus aureus infection: Secondary | ICD-10-CM

## 2019-05-29 DIAGNOSIS — R945 Abnormal results of liver function studies: Secondary | ICD-10-CM

## 2019-05-29 DIAGNOSIS — M79661 Pain in right lower leg: Secondary | ICD-10-CM | POA: Diagnosis not present

## 2019-05-29 DIAGNOSIS — R2243 Localized swelling, mass and lump, lower limb, bilateral: Secondary | ICD-10-CM | POA: Diagnosis present

## 2019-05-29 LAB — CBC WITH DIFFERENTIAL/PLATELET
Abs Immature Granulocytes: 0.04 10*3/uL (ref 0.00–0.07)
Basophils Absolute: 0.1 10*3/uL (ref 0.0–0.1)
Basophils Relative: 1 %
Eosinophils Absolute: 0.3 10*3/uL (ref 0.0–0.5)
Eosinophils Relative: 2 %
HCT: 28.6 % — ABNORMAL LOW (ref 36.0–46.0)
Hemoglobin: 8.6 g/dL — ABNORMAL LOW (ref 12.0–15.0)
Immature Granulocytes: 0 %
Lymphocytes Relative: 17 %
Lymphs Abs: 2.1 10*3/uL (ref 0.7–4.0)
MCH: 21.9 pg — ABNORMAL LOW (ref 26.0–34.0)
MCHC: 30.1 g/dL (ref 30.0–36.0)
MCV: 73 fL — ABNORMAL LOW (ref 80.0–100.0)
Monocytes Absolute: 0.8 10*3/uL (ref 0.1–1.0)
Monocytes Relative: 7 %
Neutro Abs: 9 10*3/uL — ABNORMAL HIGH (ref 1.7–7.7)
Neutrophils Relative %: 73 %
Platelets: 389 10*3/uL (ref 150–400)
RBC: 3.92 MIL/uL (ref 3.87–5.11)
RDW: 24.8 % — ABNORMAL HIGH (ref 11.5–15.5)
WBC: 12.3 10*3/uL — ABNORMAL HIGH (ref 4.0–10.5)
nRBC: 0 % (ref 0.0–0.2)

## 2019-05-29 LAB — COMPREHENSIVE METABOLIC PANEL
ALT: 86 U/L — ABNORMAL HIGH (ref 0–44)
AST: 169 U/L — ABNORMAL HIGH (ref 15–41)
Albumin: 2.8 g/dL — ABNORMAL LOW (ref 3.5–5.0)
Alkaline Phosphatase: 190 U/L — ABNORMAL HIGH (ref 38–126)
Anion gap: 8 (ref 5–15)
BUN: 22 mg/dL — ABNORMAL HIGH (ref 6–20)
CO2: 21 mmol/L — ABNORMAL LOW (ref 22–32)
Calcium: 6.9 mg/dL — ABNORMAL LOW (ref 8.9–10.3)
Chloride: 110 mmol/L (ref 98–111)
Creatinine, Ser: 1.05 mg/dL — ABNORMAL HIGH (ref 0.44–1.00)
GFR calc Af Amer: >60 mL/min (ref >60)
GFR calc non Af Amer: >60 mL/min (ref >60)
Glucose, Bld: 119 mg/dL — ABNORMAL HIGH (ref 70–99)
Potassium: 4.7 mmol/L (ref 3.5–5.1)
Sodium: 139 mmol/L (ref 135–145)
Total Bilirubin: 0.9 mg/dL (ref 0.3–1.2)
Total Protein: 6 g/dL — ABNORMAL LOW (ref 6.5–8.1)

## 2019-05-29 LAB — BRAIN NATRIURETIC PEPTIDE: B Natriuretic Peptide: 41 pg/mL (ref 0.0–100.0)

## 2019-05-29 LAB — TROPONIN I (HIGH SENSITIVITY): Troponin I (High Sensitivity): 6 ng/L (ref <18)

## 2019-05-29 LAB — MAGNESIUM: Magnesium: 1.6 mg/dL — ABNORMAL LOW (ref 1.7–2.4)

## 2019-05-29 MED ORDER — IOHEXOL 300 MG/ML  SOLN
125.0000 mL | Freq: Once | INTRAMUSCULAR | Status: AC | PRN
  Administered 2019-05-29: 17:00:00 125 mL via INTRAVENOUS

## 2019-05-29 MED ORDER — DICLOFENAC SODIUM 1 % TD GEL: 2.0000 g | Freq: Four times a day (QID) | TRANSDERMAL | 1 refills | Status: AC

## 2019-05-29 NOTE — ED Triage Notes (Signed)
Pt via pov from home after a fall this morning, for which she was treated at [facility] this morning. She then went to see Dr. Doy Hutching for a pre-set appointment, and he wanted her to come to ED for CT abdomen and pelvis, labs. Pt has picc line, through which she receives antibiotics. Pt alert & oriented, NAD noted.

## 2019-05-29 NOTE — ED Triage Notes (Addendum)
Per EMS patient fell and injured left great toe. Patient is due to have right hip replaced and had been having trouble ambulating and tripped over her flip flops this morning. Denies LOC. Patient states hit her right temple and complains of right shoulder pain. States she gets heparin through a pick line.

## 2019-05-29 NOTE — ED Notes (Signed)
Sent rainbow with extra purple top tubes to lab.

## 2019-05-29 NOTE — Discharge Instructions (Signed)
Your x-rays reveal that you have 3 broken toes, these are likely nonsurgical however you will need to wear the postop shoe or brace on your foot until you follow-up with your orthopedic surgeon in [place].  You may seek medical exam for any severe or worsening symptoms.  You are already taking oxycodone but you may find some additional relief with Voltaren gel topically.

## 2019-05-29 NOTE — ED Provider Notes (Signed)
[facility] EMERGENCY DEPARTMENT Provider Note   CSN: [id number] Arrival date & time: 05/29/19  1146     History   Chief Complaint Chief Complaint  Patient presents with   Fall    HPI Karen Lewis is a 48 y.o. female.     HPI  This patient is a 48 year old female, she has a known history of anemia, she is morbidly obese.  She reports that she is currently being treated for mycoplasma abscess after panniculectomy.  She has a PICC line in her left arm and has been doing well, she feels generally fatigued but this seems with somewhat chronic.  She is supposed to walk with a Rollator but does not always use it and today was walking up her ramp when her flip-flop got caught on 1 of the floor boards causing her to fall forward.  She injured her left great toe and bumped the right side of her periorbital area.  This was mild and did not cause any loss of consciousness.  She has more pain in her left toe than anything else.  She denies any deformity or bleeding of that area.  She denies any significant headache chest pain coughing shortness of breath or any other symptoms.  The seem to be a mechanical fall when her flip-flop got caught.  Past Medical History:  Diagnosis Date   Anemia    Anginal pain (HCC)    Anxiety    Arthralgia of hip 07/29/2015   Arthritis    Arthritis, degenerative 07/29/2015   Asthma    Cephalalgia 07/25/2014   Dependence on unknown drug ([place])    multiplt controlled drug dependence   Depression    Diabetes mellitus without complication ([place])    Dysrhythmia    Eczema    Fibromyalgia    Gastritis    GERD (gastroesophageal reflux disease)    Gonalgia 07/29/2015   Overview:  Overview:  The patient has had bilateral intra-articular Hyalgan injections done on 07/16/2014 and although she seems to do well with this type of therapy, apparently her insurance company does not want to pay for they Hyalgan. On 11/27/2014 the patient underwent a bilateral  genicular nerve block with excellent results. On 01/28/2015 she had a right knee genicular radiofrequency ablatio   Gout    H/O cardiovascular disorder 03/10/2015   H/O surgical procedure 12/05/2012   Overview:  LSG ([facility] - April 2013)     H/O thyroid disease 03/10/2015   Headache    Herpes    History of artificial joint 07/29/2015   History of hiatal hernia    Hypertension    Hypomagnesemia    Hypothyroidism    IDA (iron deficiency anemia) 05/28/2019   LBP (low back pain) 07/29/2015   Neuromuscular disorder ([place])    Obesity    PCOS (polycystic ovarian syndrome)    Primary osteoarthritis of both knees 07/29/2015   Sleep apnea    Thyroid nodule bilateral    Patient Active Problem List   Diagnosis Date Noted   IDA (iron deficiency anemia) 05/28/2019   Mycobacterium abscessus infection 02/22/2019   Surgical wound, non healing 09/27/2018   Wound infection after surgery 08/11/2018   Panniculitis 07/05/2018   Allergy to iodine 04/26/2018   H/O allergy to shellfish 04/26/2018   Encounter for dental examination 02/21/2018   Spondylosis without myelopathy or radiculopathy, lumbosacral region 02/21/2018   History of gastric bypass 02/15/2018   Depression 01/19/2018   Secondary Osteoarthritis of knee (Bilateral) (R>L) 01/02/2018   Encounter  for dental exam and cleaning w/o abnormal findings 12/19/2017   Post op infection 12/05/2017   Osteoarthritis of hip (Right) 07/07/2017   Osteoarthritis of shoulder (Left) 05/31/2017   Upper extremity pain 04/21/2017   Chronic shoulder pain (Left) 04/21/2017   Chronic pain syndrome 12/02/2016   Neurogenic pain 12/02/2016   Chest pain with low risk of acute coronary syndrome 08/04/2016   SOB (shortness of breath) on exertion 08/04/2016   Dysphagia, unspecified 05/04/2016   Gastroesophageal reflux disease without esophagitis 05/04/2016   Vitamin D insufficiency 11/27/2015   Nontoxic goiter 11/12/2015     Other psychoactive substance dependence, uncomplicated ([place]) [id number]   Controlled drug dependence ([place]) 10/13/2015   Lumbar spondylosis 09/18/2015   Hypomagnesemia 08/27/2015   Morbid obesity with body mass index (BMI) greater than or equal to 70 in adult [facility]) 08/27/2015   Long term current use of opiate analgesic 07/29/2015   Long term prescription opiate use 07/29/2015   Opiate use 07/29/2015   Encounter for therapeutic drug level monitoring 07/29/2015   Opiate dependence ([place]) 07/29/2015   Chronic knee pain (Primary Area of Pain) (Bilateral) (R>L) 07/29/2015   Chronic low back pain (Third area of Pain) (Bilateral) (R>L) 07/29/2015   Lumbar facet syndrome (Bilateral) (R>L) 07/29/2015   Osteoarthritis 07/29/2015   Grade 1 (1.4 cm) Anterolisthesis of L4 over L5 07/29/2015   Chronic hip pain (Secondary area of Pain) (Right) 07/29/2015   S/P THR (total hip replacement) (Left) 07/29/2015   History of methicillin resistant staphylococcus aureus (MRSA) 07/29/2015   History of bariatric surgery 07/29/2015   History of methicillin resistant Staphylococcus aureus infection 07/29/2015   Eczema 05/21/2015   Gout 05/21/2015   Headache, migraine 05/21/2015   Bilateral polycystic ovarian syndrome 05/21/2015   Disease of thyroid gland 05/21/2015   Major depressive disorder, single episode 05/21/2015   Dermatitis 05/21/2015   Obstructive apnea 04/30/2015   History of asthma 03/10/2015   Binge eating disorder 03/10/2015   Fibromyalgia 03/10/2015   Anxiety, generalized 03/10/2015   Insomnia, persistent 03/10/2015   Depression, major, recurrent, moderate ([place]) 03/10/2015   Avitaminosis D 04/30/2014   Hyperlipidemia 04/25/2014   Hyperlipidemia due to type 2 diabetes mellitus ([place]) [id number]   Uncomplicated asthma [id number]   Asthma 03/19/2014   Goiter, nontoxic, multinodular 10/09/2013   Apnea, sleep 12/05/2012   History of surgical procedure  12/05/2012   Type 2 diabetes mellitus without complication, without long-term current use of insulin ([place]) 12/05/2012   Hypertension associated with diabetes ([place]) 12/05/2012    Past Surgical History:  Procedure Laterality Date   CARPAL TUNNEL RELEASE Bilateral    CHOLECYSTECTOMY     DIAGNOSTIC LAPAROSCOPY     HIATAL HERNIA REPAIR     JOINT REPLACEMENT Left hip   LAPAROSCOPIC PARTIAL GASTRECTOMY     left trigger finger     peniculectomy N/A 07/05/2018   ROUX-EN-Y GASTRIC BYPASS  06/03/2017   SHOULDER ARTHROSCOPY Right    THYROIDECTOMY N/A 11/12/2015   Procedure: THYROIDECTOMY;  Surgeon: Clyde Canterbury, MD;  Location: [facility] ORS;  Service: ENT;  Laterality: N/A;   TRIGGER FINGER RELEASE Right      OB History    Gravida  0   Para  0   Term  0   Preterm  0   AB  0   Living  0     SAB  0   TAB  0   Ectopic  0   Multiple  0   Live Births  0  Home Medications    Prior to Admission medications   Medication Sig Start Date End Date Taking? Authorizing Provider  ACCU-CHEK AVIVA PLUS test strip  03/13/19   [provider]  acyclovir (ZOVIRAX) 800 MG tablet Take 800 mg by mouth as needed. Reported on 11/12/2015    [provider]  albuterol (PROVENTIL HFA;VENTOLIN HFA) 108 (90 BASE) MCG/ACT inhaler Inhale 2 puffs into the lungs every 6 (six) hours as needed for wheezing or shortness of breath.    [provider]  amikacin (AMIKIN) 1 GM/4ML SOLN injection  03/22/19   [provider]  amitriptyline (ELAVIL) 100 MG tablet  01/12/18   [provider]  Biotin 10 MG CAPS     [provider]  clotrimazole (MYCELEX) 10 MG troche Take 10 mg by mouth as needed. 06/29/18   [provider]  diclofenac sodium (VOLTAREN) 1 % GEL Apply 2 g topically 4 (four) times daily. 05/29/19   Noemi Chapel, MD  diphenhydrAMINE (BENADRYL) 25 mg capsule Take 25 mg by mouth as needed.    [provider]    Emollient (A + D PERSONAL CARE LOTION EX) Apply 1 application topically as needed. 05/23/18   [provider]  EPINEPHrine 0.3 mg/0.3 mL IJ SOAJ injection Inject 0.3 mg into the skin as needed.    [provider]  fluconazole (DIFLUCAN) 100 MG tablet  04/30/19   [provider]  imipenem-cilastatin (PRIMAXIN) 500 MG injection Inject 1,000 mg into the vein every 12 (twelve) hours. 03/13/19   Tsosie Billing, MD  ketoconazole (NIZORAL) 2 % cream APPLY AS DIRECTED TO AFFECTED AREA TWICE DAILY AS NEEDED 12/17/14   [provider]  levothyroxine (SYNTHROID) 125 MCG tablet Take 250 mcg by mouth.  08/31/18 08/31/19  [provider]  lisinopril (ZESTRIL) 5 MG tablet Take by mouth. 12/11/18 12/11/19  [provider]  magnesium oxide (MAG-OX) 400 MG tablet Take 1 tablet (400 mg total) by mouth 2 (two) times daily. 06/09/19 09/07/19  Milinda Pointer, MD  metFORMIN (GLUCOPHAGE) 1000 MG tablet Take 1,000 mg by mouth 2 (two) times daily with a meal.     [provider]  metoprolol tartrate (LOPRESSOR) 50 MG tablet Take 50 mg by mouth 2 (two) times daily.  03/16/19   [provider]  montelukast (SINGULAIR) 10 MG tablet Take 10 mg by mouth at bedtime.     [provider]  naloxone [facility]) nasal spray 4 mg/0.1 mL Spray into one nostril. Repeat with second device into other nostril after 2-3 minutes if no or minimal response. 02/08/19   Milinda Pointer, MD  nystatin (MYCOSTATIN) 100000 UNIT/ML suspension Swish and swallow 2 mLs 4 (four) times daily 02/06/19   [provider]  omeprazole (PRILOSEC) 40 MG capsule Take 40 mg by mouth 2 (two) times daily.     [provider]  ondansetron (ZOFRAN-ODT) 4 MG disintegrating tablet Take 4 mg by mouth as needed. 07/11/18   [provider]  oxyCODONE (OXY IR/ROXICODONE) 5 MG immediate release tablet Take 1 tablet (5 mg total) by mouth 5 (five) times daily. Must last 30  days 06/09/19 07/09/19  Milinda Pointer, MD  oxyCODONE (OXY IR/ROXICODONE) 5 MG immediate release tablet Take 1 tablet (5 mg total) by mouth 5 (five) times daily. Must last 30 days 07/09/19 08/08/19  Milinda Pointer, MD  oxyCODONE (OXY IR/ROXICODONE) 5 MG immediate release tablet Take 1 tablet (5 mg total) by mouth 5 (five) times daily. Must last 30 days 08/08/19  09/07/19  Milinda Pointer, MD  pregabalin (LYRICA) 225 MG capsule Take 1 capsule (225 mg total) by mouth 2 (two) times daily. 06/09/19 09/07/19  Milinda Pointer, MD  QUEtiapine (SEROQUEL) 100 MG tablet Take 100 mg by mouth at bedtime.    [provider]  rosuvastatin (CRESTOR) 40 MG tablet  02/28/19   [provider]  sertraline (ZOLOFT) 100 MG tablet Take 200 mg by mouth daily.  03/13/19   [provider]  spironolactone (ALDACTONE) 25 MG tablet Take 37.5 mg by mouth 2 (two) times daily.     [provider]  tigecycline (TYGACIL) 50 MG injection Inject 50 mg into the vein every 12 (twelve) hours.    [provider]  tiZANidine (ZANAFLEX) 4 MG tablet Take by mouth. 02/28/19   [provider]  triamcinolone (NASACORT AQ) 55 MCG/ACT AERO nasal inhaler 2 sprays by Nasal route 2 (two) times daily.    [provider]  TYGACIL 50 MG injection  04/17/19   [provider]  vitamin C (ASCORBIC ACID) 500 MG tablet Take 500 mg by mouth daily.    [provider]  Vitamin D, Ergocalciferol, (DRISDOL) 1.25 MG (50000 UT) CAPS capsule  03/29/19   [provider]  zolpidem (AMBIEN) 10 MG tablet Take 10 mg by mouth at bedtime as needed for sleep.    [provider]  zonisamide (ZONEGRAN) 50 MG capsule Take 150 mg by mouth daily. 3 tabs at bedtime    [provider]    Family History Family History  Problem Relation Age of Onset   Anxiety disorder Mother    Depression Mother    Alcohol abuse Mother    Diabetes Mother    Hypertension  Mother    Kidney cancer Mother    Sleep apnea Mother    Alcohol abuse Father    Anxiety disorder Father    Depression Father    Post-traumatic stress disorder Father    Kidney failure Father    COPD Father    Diabetes Father    Hypertension Father    Sleep apnea Father    Depression Brother    Diabetes Brother    Hypertension Brother    Sleep apnea Brother    Breast cancer Paternal Aunt    Bladder Cancer Neg Hx    Prostate cancer Neg Hx     Social History Social History   Tobacco Use   Smoking status: Never Smoker   Smokeless tobacco: Never Used  Substance Use Topics   Alcohol use: No    Alcohol/week: 0.0 standard drinks   Drug use: No     Allergies   Bactrim [sulfamethoxazole-trimethoprim], Omalizumab, Ciprofloxacin, Shellfish allergy, Aspirin, Clindamycin, Motrin [ibuprofen], Nsaids, Other, and Sulfa antibiotics   Review of Systems Review of Systems  Constitutional: Positive for fatigue.  Musculoskeletal:       Toe injury  Skin: Negative for rash and wound.  Neurological: Negative for numbness and headaches.     Physical Exam Updated Vital Signs BP 127/79 (BP Location: Right Arm)    Pulse 85    Temp 98.5 F (36.9 C) (Oral)    Resp 18    SpO2 100%   Physical Exam Vitals signs and nursing note reviewed.  Constitutional:      Appearance: She is well-developed. She is not diaphoretic.  HENT:     Head: Normocephalic.     Comments: Tiny area of abrasion lateral to the eyeglasses on the right in the periorbital region, small  subcentimeter hematoma to the upper eye brow on the right Eyes:     General:        Right eye: No discharge.        Left eye: No discharge.     Conjunctiva/sclera: Conjunctivae normal.     Comments: Normal conjunctive a, lids, extraocular movements and pupils  Cardiovascular:     Rate and Rhythm: Normal rate and regular rhythm.  Pulmonary:     Effort: Pulmonary effort is normal. No respiratory distress.    Musculoskeletal:     Comments: The patient is morbidly obese but able to move all 4 extremities.  She has a PICC line in her left upper extremity which appears clean and nontender and nonerythematous.  There is some pain of the great toe on the left but no obvious deformity.  Pain with range of motion.  The foot itself is nontender on the left, the other 4 toes #2 through 5 are nontender  Skin:    General: Skin is warm and dry.     Findings: No erythema or rash.  Neurological:     Mental Status: She is alert.     Coordination: Coordination normal.     Comments: Though the patient appears to be in no distress she complains of fatigue, she is able to move all 4 extremities and seems to be at baseline with mental status and level of alertness      ED Treatments / Results  Labs (all labs ordered are listed, but only abnormal results are displayed) Labs Reviewed - No data to display  EKG None  Radiology Dg Foot Complete Left  Result Date: 05/29/2019 CLINICAL DATA:  Pt tripped while wearing flip flops causing fall. Pt c/o severe pain in proximal Lt great toe with radiation into metatarsals EXAM: LEFT FOOT - COMPLETE 3+ VIEW COMPARISON:  None. FINDINGS: There are nondisplaced oblique for fractures involving the first toe proximal phalanx in the midshaft, second toe proximal phalanx near the base, and also the third toe proximal phalanx in the midshaft. No evidence of dislocation. There are arthritic changes most pronounced in the mid and hindfoot. IMPRESSION: Nondisplaced fractures involving the first through third toe proximal phalanges in the left foot as above. Electronically Signed   By: Audie Pinto M.D.   On: 05/29/2019 12:52    Procedures Procedures (including critical care time)  Medications Ordered in ED Medications - No data to display   Initial Impression / Assessment and Plan / ED Course  I have reviewed the triage vital signs and the nursing notes.  Pertinent labs &  imaging results that were available during my care of the patient were reviewed by me and considered in my medical decision making (see chart for details).  Clinical Course as of May 29 1319  Tue May 29, 2019  1246 I have personally viewed the x-rays of the feet and the toes, there does appear to be fractures of the proximal phalanx of the first second and third toe of the left foot.  Minimal if any displacement.  We will placed in postop shoe   [BM]    Clinical Course User Index [BM] Noemi Chapel, MD       The toe appears to have had some kind of injury.  The patient has no signs of deformity, will obtain an x-ray.  She is not requesting any pain medication and does not seem to be in that much pain when she is not being examined.  Cardiovascular exam  is unremarkable and she is not tachycardic nor is she febrile.  She is well-appearing otherwise, x-ray, ice pack, anticipate discharge.  She does report that she is pending a hip replacement on the right but cannot have it until she is done with her antibiotics for her current infection.  She has no pain in her hips at this time.  I agree with radiology interpretation of the x-rays.  The patient is already in a pain clinic and on oxycodone, requesting Voltaren gel in addition to her home medications which I think is reasonable.  She has an orthopedist in [place] and will follow-up.  Postop shoe given prior to discharge, placed by nursing staff  Final Clinical Impressions(s) / ED Diagnoses   Final diagnoses:  Closed nondisplaced fracture of proximal phalanx of right great toe, initial encounter    ED Discharge Orders         Ordered    diclofenac sodium (VOLTAREN) 1 % GEL  4 times daily     05/29/19 1318           Noemi Chapel, MD 05/29/19 1320

## 2019-05-29 NOTE — ED Notes (Signed)
Patient reports swelling to right thigh x 2 weeks slowly increased in size. Patient w/c bound. Reports has gained 20lbs of water weight at swelling area. Patient reports had a doppler done and it was normal for [place].

## 2019-05-29 NOTE — ED Notes (Signed)
Patient off unit to ct  

## 2019-05-29 NOTE — ED Notes (Signed)
ID at bedside to  Consult patient

## 2019-05-29 NOTE — ED Notes (Signed)
Returned from ct awaiting u/s r/o dvt

## 2019-05-29 NOTE — Consult Note (Signed)
NAME: Karen Lewis  DOB: 1971/04/14  MRN: [id number]  Date/Time: 05/29/2019 7:45 PM  REQUESTING PROVIDER: Dr. Rip Harbour Subjective:  REASON FOR CONSULT: Mycobacterium infection ? Karen Lewis is a 48 y.o. 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 Patient says she has been feeling very tired for the last 2 to 3 weeks.  She also complains of swelling of her right side of her abdomen and leg including thigh. She has been on IV amikacin for the past 3 weeks, IV imipenem for the past 6 weeks and IV tigecycline for nearly the same time.  I have been following her labs as outpatient.  Her LFTs were trending high and her creatinine was borderline at 1.2.  So yesterday she was asked to hold all her antibiotics.  She was supposed to see me in my office today. But patient says her flip-flops buckled and she fell and since she had severe pain she went to [facility].  She had an x-ray of the foot and was discharged from the ED.  She went to see her PCP this afternoon and was asked to go to the ED.  I am seeing the patient for further bowel condition..  Patient has complicated medical history. She had a gastric sleeve in 2013 and lost 150 pounds initially.  She then started gaining weight and then underwent gastric bypass Roux-en-Y in September 2018.  She had another 50 pound weight loss.  She underwent elective panniculectomy October 2019.  The surgical site did not heal completely and had persistent discharge.  Initially the culture had Pseudomonas and MRSA and she was taking Levaquin and doxycycline.  She also had a wound VAC at one point.  .  On May 27 220 she underwent panniculectomy because of ongoing discharge, erythema and pain.  The culture  from that site grew Mycobacterium abscesses.  I saw her on 02/22/2019.  Repeat culture sent from the drain also had Mycobacterium abscesses.  This bacteria was sensitive to amikacin, tigecycline.  It was intermediate to imipenem and resistant to clarithromycin and moxifloxacin. She had a PICC line placed on 03/13/2019 She was initially started on amikacin, imipenem and tigecycline.  The abdominal drains were removed by the surgeon.  I saw her as follow-up on 03/28/2019 and is clofazimine was obtained I stopped the amikacin and started her on clofazimine.  After 48 hours of starting clofazimine it was put on hold because of possibility of oral sores.  It was restarted the next week on 04/03/2019.  On 04/10/2027 LFTs started to climb up and AST and ALT were high.  She was also feeling very tired.  Initially the tigecycline was stopped on 04/20/2019.  But LFTs persisted and then the clofazimine was stopped and patient's LFTs became normal.  She was restarted on tigecycline on 05/01/2019 and and later on amikacin. Meanwhile she was complaining of fatigue and also swelling of her right leg.  She had a Doppler which was negative for DVT on 04/26/2019.  She saw hematologist  and planning to get iron transfusion tomorrow. She has a follow-up with her surgeon who did the panniculectomy Dr. Gustavus Messing on Thursday.  Patient says she has gained 20 pounds.  She feels very swollen especially abdomen and right leg She has no  fever She is extremely tired   Past Medical History:  Diagnosis Date  . Anemia   . Anginal pain ([place])   . Anxiety   . Arthralgia of hip 07/29/2015  . Arthritis   . Arthritis, degenerative 07/29/2015  . Asthma   . Cephalalgia 07/25/2014  . Dependence on unknown drug ([place])    multiplt controlled drug dependence  . Depression   . Diabetes mellitus without complication ([place])   . Dysrhythmia   . Eczema   . Fibromyalgia   . Gastritis   . GERD (gastroesophageal reflux disease)   . Gonalgia  07/29/2015   Overview:  Overview:  The patient has had bilateral intra-articular Hyalgan injections done on 07/16/2014 and although she seems to do well with this type of therapy, apparently her insurance company does not want to pay for they Hyalgan. On 11/27/2014 the patient underwent a bilateral genicular nerve block with excellent results. On 01/28/2015 she had a right knee genicular radiofrequency ablatio  . Gout   . H/O cardiovascular disorder 03/10/2015  . H/O surgical procedure 12/05/2012   Overview:  LSG ([facility] - April 2013)    . H/O thyroid disease 03/10/2015  . Headache   . Herpes   . History of artificial joint 07/29/2015  . History of hiatal hernia   . Hypertension   . Hypomagnesemia   . Hypothyroidism   . IDA (iron deficiency anemia) 05/28/2019  . LBP (low back pain) 07/29/2015  . Neuromuscular disorder ([place])   . Obesity   . PCOS (polycystic ovarian syndrome)   . Primary osteoarthritis of both knees 07/29/2015  . Sleep apnea   . Thyroid nodule bilateral    Past Surgical History:  Procedure Laterality Date  . CARPAL TUNNEL RELEASE Bilateral   . CHOLECYSTECTOMY    . DIAGNOSTIC LAPAROSCOPY    . HIATAL HERNIA REPAIR    . JOINT REPLACEMENT Left hip  . LAPAROSCOPIC PARTIAL GASTRECTOMY    . left trigger finger    . peniculectomy N/A 07/05/2018  . ROUX-EN-Y GASTRIC BYPASS  06/03/2017  . SHOULDER ARTHROSCOPY Right   . THYROIDECTOMY N/A 11/12/2015   Procedure: THYROIDECTOMY;  Surgeon: Clyde Canterbury, MD;  Location: [facility] ORS;  Service: ENT;  Laterality: N/A;  . TRIGGER FINGER RELEASE Right     Social History   Socioeconomic History  . Marital status: Significant Other    Spouse name: Not on file  . Number of children: Not on file  . Years of education: Not on file  . Highest education level: Not on file  Occupational History  . Not on file  Social Needs  . Financial resource strain: Not on file  . Food insecurity    Worry: Not on file    Inability: Not on file  .  Transportation needs    Medical: Not on file    Non-medical: Not on file  Tobacco Use  . Smoking status: Never Smoker  . Smokeless tobacco: Never Used  Substance and Sexual Activity  . Alcohol use: No    Alcohol/week: 0.0 standard drinks  . Drug use: No  . Sexual activity: Not Currently    Birth control/protection: Injection  Lifestyle  . Physical activity    Days per week: Not on file    Minutes per session: Not on file  . Stress: Not on file  Relationships  . Social Herbalist on phone: Not on file    Gets together: Not on file    Attends religious service: Not  on file    Active member of club or organization: Not on file    Attends meetings of clubs or organizations: Not on file    Relationship status: Not on file  . Intimate partner violence    Fear of current or ex partner: Not on file    Emotionally abused: Not on file    Physically abused: Not on file    Forced sexual activity: Not on file  Other Topics Concern  . Not on file  Social History Narrative  . Not on file    Family History  Problem Relation Age of Onset  . Anxiety disorder Mother   . Depression Mother   . Alcohol abuse Mother   . Diabetes Mother   . Hypertension Mother   . Kidney cancer Mother   . Sleep apnea Mother   . Alcohol abuse Father   . Anxiety disorder Father   . Depression Father   . Post-traumatic stress disorder Father   . Kidney failure Father   . COPD Father   . Diabetes Father   . Hypertension Father   . Sleep apnea Father   . Depression Brother   . Diabetes Brother   . Hypertension Brother   . Sleep apnea Brother   . Breast cancer Paternal Aunt   . Bladder Cancer Neg Hx   . Prostate cancer Neg Hx    Allergies  Allergen Reactions  . Bactrim [Sulfamethoxazole-Trimethoprim] Hives  . Omalizumab Itching and Hives  . Ciprofloxacin Other (See Comments)    Muscle Pain Possible myalgias. myalgia Possible myalgias. myalgia  . Shellfish Allergy Other (See  Comments)    unknown  . Aspirin Other (See Comments)    GI UPSET  . Clindamycin Rash  . Motrin [Ibuprofen]     Other reaction(s): Other (See Comments) GI UPSET  . Nsaids Nausea Only and Other (See Comments)    Stomach upset Other reaction(s): Other (See Comments) Stomach upset  . Other Hives  . Sulfa Antibiotics Hives    ? No current facility-administered medications for this encounter.    Current Outpatient Medications  Medication Sig Dispense Refill  . ACCU-CHEK AVIVA PLUS test strip     . acyclovir (ZOVIRAX) 800 MG tablet Take 800 mg by mouth as needed. Reported on 11/12/2015    . albuterol (PROVENTIL HFA;VENTOLIN HFA) 108 (90 BASE) MCG/ACT inhaler Inhale 2 puffs into the lungs every 6 (six) hours as needed for wheezing or shortness of breath.    Marland Kitchen amikacin (AMIKIN) 1 GM/4ML SOLN injection     . amitriptyline (ELAVIL) 100 MG tablet   3  . Biotin 10 MG CAPS     . clotrimazole (MYCELEX) 10 MG troche Take 10 mg by mouth as needed.  3  . diclofenac sodium (VOLTAREN) 1 % GEL Apply 2 g topically 4 (four) times daily. 100 g 1  . diphenhydrAMINE (BENADRYL) 25 mg capsule Take 25 mg by mouth as needed.    . Emollient (A + D PERSONAL CARE LOTION EX) Apply 1 application topically as needed.    Marland Kitchen EPINEPHrine 0.3 mg/0.3 mL IJ SOAJ injection Inject 0.3 mg into the skin as needed.    . fluconazole (DIFLUCAN) 100 MG tablet     . imipenem-cilastatin (PRIMAXIN) 500 MG injection Inject 1,000 mg into the vein every 12 (twelve) hours. 1 each 0  . ketoconazole (NIZORAL) 2 % cream APPLY AS DIRECTED TO AFFECTED AREA TWICE DAILY AS NEEDED    . levothyroxine (SYNTHROID) 125 MCG tablet Take  250 mcg by mouth.     Marland Kitchen lisinopril (ZESTRIL) 5 MG tablet Take by mouth.    Derrill Memo ON 06/09/2019] magnesium oxide (MAG-OX) 400 MG tablet Take 1 tablet (400 mg total) by mouth 2 (two) times daily. 180 tablet 0  . metFORMIN (GLUCOPHAGE) 1000 MG tablet Take 1,000 mg by mouth 2 (two) times daily with a meal.     .  metoprolol tartrate (LOPRESSOR) 50 MG tablet Take 50 mg by mouth 2 (two) times daily.     . montelukast (SINGULAIR) 10 MG tablet Take 10 mg by mouth at bedtime.     . naloxone (NARCAN) nasal spray 4 mg/0.1 mL Spray into one nostril. Repeat with second device into other nostril after 2-3 minutes if no or minimal response. 1 kit 0  . nystatin (MYCOSTATIN) 100000 UNIT/ML suspension Swish and swallow 2 mLs 4 (four) times daily    . omeprazole (PRILOSEC) 40 MG capsule Take 40 mg by mouth 2 (two) times daily.     . ondansetron (ZOFRAN-ODT) 4 MG disintegrating tablet Take 4 mg by mouth as needed.  0  . [START ON 06/09/2019] oxyCODONE (OXY IR/ROXICODONE) 5 MG immediate release tablet Take 1 tablet (5 mg total) by mouth 5 (five) times daily. Must last 30 days 150 tablet 0  . [START ON 07/09/2019] oxyCODONE (OXY IR/ROXICODONE) 5 MG immediate release tablet Take 1 tablet (5 mg total) by mouth 5 (five) times daily. Must last 30 days 150 tablet 0  . [START ON 08/08/2019] oxyCODONE (OXY IR/ROXICODONE) 5 MG immediate release tablet Take 1 tablet (5 mg total) by mouth 5 (five) times daily. Must last 30 days 150 tablet 0  . [START ON 06/09/2019] pregabalin (LYRICA) 225 MG capsule Take 1 capsule (225 mg total) by mouth 2 (two) times daily. 180 capsule 0  . QUEtiapine (SEROQUEL) 100 MG tablet Take 100 mg by mouth at bedtime.    . rosuvastatin (CRESTOR) 40 MG tablet     . sertraline (ZOLOFT) 100 MG tablet Take 200 mg by mouth daily.     Marland Kitchen spironolactone (ALDACTONE) 25 MG tablet Take 37.5 mg by mouth 2 (two) times daily.     . tigecycline (TYGACIL) 50 MG injection Inject 50 mg into the vein every 12 (twelve) hours.    Marland Kitchen tiZANidine (ZANAFLEX) 4 MG tablet Take by mouth.    . triamcinolone (NASACORT AQ) 55 MCG/ACT AERO nasal inhaler 2 sprays by Nasal route 2 (two) times daily.    Marland Kitchen TYGACIL 50 MG injection     . vitamin C (ASCORBIC ACID) 500 MG tablet Take 500 mg by mouth daily.    . Vitamin D, Ergocalciferol, (DRISDOL)  1.25 MG (50000 UT) CAPS capsule     . zolpidem (AMBIEN) 10 MG tablet Take 10 mg by mouth at bedtime as needed for sleep.    Marland Kitchen zonisamide (ZONEGRAN) 50 MG capsule Take 150 mg by mouth daily. 3 tabs at bedtime       Abtx:  Anti-infectives (From admission, onward)   None      REVIEW OF SYSTEMS:  Const: negative fever, negative chills, has gained 20 pounds which she thinks is fluid.  Feeling tired eyes: negative diplopia or visual changes, negative eye pain ENT: negative coryza, negative sore throat Resp: negative cough, hemoptysis, dyspnea Cards: negative for chest pain, palpitations, has lower extremity edema GU: negative for frequency, dysuria and hematuria GI: Negative for abdominal pain, diarrhea, bleeding, constipation Skin: Has some minimal discharge from the abdominal wall wound which  is almost closed Heme: negative for easy bruising and gum/nose bleeding MS: Right hip pain, muscle weakness Neurolo:negative for headaches, dizziness, vertigo, memory problems  Psych:  anxiety, depression  Endocrine: No polyuria or polydipsia Allergy/Immunology- negative for any medication or food allergies ? Pertinent Positives include : Objective:  VITALS:  BP 127/79 (BP Location: Right Arm)   Pulse 85   Temp 98.5 F (36.9 C) (Oral)   Resp 18   SpO2 100%  PHYSICAL EXAM:  General: Alert, cooperative, no distress, appears stated age.  Pale, morbid obesity Head: Normocephalic, without obvious abnormality, atraumatic. Eyes: Conjunctivae clear, anicteric sclerae. Pupils are equal ENT Nares normal. No drainage or sinus tenderness. Lips, mucosa, and tongue normal. No Thrush She chews rubber band Neck: Supple, symmetrical, no adenopathy, thyroid: non tender no carotid bruit and no JVD. Back: No CVA tenderness. Lungs: Clear to auscultation bilaterally. No Wheezing or Rhonchi. No rales. Heart: Regular rate and rhythm, no murmur, rub or gallop. Abdomen: Soft, non-tender,not distended.  Some  abdominal wall edema especially on the right side.  Left side surgical site is healing there are couple of areas where there is possible sinus track extremities: Brawny edema on the lateral aspect of the right leg from the hip to the ankle Skin: No rashes or lesions. Or bruising Lymph: Cervical, supraclavicular normal. Neurologic: Grossly non-focal Pertinent Labs Lab Results CBC    Component Value Date/Time   WBC 12.3 (H) 05/29/2019 1612   RBC 3.92 05/29/2019 1612   HGB 8.6 (L) 05/29/2019 1612   HGB 12.1 12/13/2013 1628   HCT 28.6 (L) 05/29/2019 1612   HCT 38.4 12/13/2013 1628   PLT 389 05/29/2019 1612   PLT 364 12/13/2013 1628   MCV 73.0 (L) 05/29/2019 1612   MCV 82 12/13/2013 1628   MCH 21.9 (L) 05/29/2019 1612   MCHC 30.1 05/29/2019 1612   RDW 24.8 (H) 05/29/2019 1612   RDW 16.9 (H) 12/13/2013 1628   LYMPHSABS 2.1 05/29/2019 1612   LYMPHSABS 3.2 04/10/2013 1836   MONOABS 0.8 05/29/2019 1612   MONOABS 0.7 04/10/2013 1836   EOSABS 0.3 05/29/2019 1612   EOSABS 0.3 04/10/2013 1836   BASOSABS 0.1 05/29/2019 1612   BASOSABS 0.1 04/10/2013 1836    CMP Latest Ref Rng & Units 05/29/2019 05/22/2019 05/22/2019  Glucose 70 - 99 mg/dL 119(H) 91 94  BUN 6 - 20 mg/dL 22(H) 29(H) 30(H)  Creatinine 0.44 - 1.00 mg/dL 1.05(H) 1.19(H) 1.22(H)  Sodium 135 - 145 mmol/L 139 137 137  Potassium 3.5 - 5.1 mmol/L 4.7 5.2(H) 5.2(H)  Chloride 98 - 111 mmol/L 110 107 107  CO2 22 - 32 mmol/L 21(L) 19(L) 19(L)  Calcium 8.9 - 10.3 mg/dL 6.9(L) 7.2(L) 7.3(L)  Total Protein 6.5 - 8.1 g/dL 6.0(L) 6.0(L) -  Total Bilirubin 0.3 - 1.2 mg/dL 0.9 0.6 -  Alkaline Phos 38 - 126 U/L 190(H) 176(H) -  AST 15 - 41 U/L 169(H) 192(H) -  ALT 0 - 44 U/L 86(H) 97(H) -      Microbiology: No results found for this or any previous visit (from the past 240 hour(s)).  IMAGING RESULTS: Moderate edema and soft tissue stranding within the subcutaneous fat of the abdominal wall. Resolved left lower lateral  subcutaneous fluid collections with residual thickening and calcification in the region. Linear soft tissue densities extend to the skin surface and may reflect previously demonstrated fistulas. CT abdomen and pelvis reviewed personally I have personally reviewed the films ? Impression/Recommendation ? ?Fall with nondisplaced fractures involving  the first through third toe proximal phalanx in the left foot.  Generalized fatigue and weakness: Combination of anemia, hypoalbuminemia, obesity, recent fluid retention.  Chronic anemia.  She has gastric bypass so has iron deficiency anemia.  Has seen hematologist and will be getting IV iron tomorrow.  Hypoalbuminemia,  Abnormal AST and ALT.  There is no evidence of cirrhosis on the CAT scan or ascites. Need to consider medication induced.  She is currently on 3 and different antibiotics  Mycobacterium abscesses of the abdominal wall tissue following panniculectomy. On IV amikacin, IV tigecycline and IV imipenem.  She has been on amikacin for 3 weeks now, and imipenem for nearly 6 weeks and tigecycline  interrupted 3 to 4 weeks.  Will explore changing him to oral medication.  We will need to check with her insurance whether omadacycline and tidezolid will be covered.  Third antibiotic would be clofazimine  Trend in elevation of creatinine from 0.6-1.  2 2 Today it is 1.05. Amikacin has been on hold since yesterday.  Will not restart it now.  Patient does not need to get any antibiotics in the hospital today.  I plan to follow her as outpatient and start oral antibiotics for Mycobacterium abscesses infection.  Will not remove the PICC line now.  As allowed as this was how she is doing on oral antibiotics.  Discussed the management with Dr. Rip Harbour   __________________ Discussed with patient, Note:  This document was prepared using Dragon voice recognition software and may include unintentional dictation errors.

## 2019-05-29 NOTE — Discharge Instructions (Addendum)
Dr. Doy Hutching says he will get you home health tomorrow.  Please call him up and remind him in the morning.  See if he needs anything else from you.  Your surgeon will expect you in the office on Thursday.  He noticed that your protein in your blood is a little low and wants you to eat more protein which would be meat.  Please return for any fever redness or increasing swelling.  Also return for any shortness of breath.

## 2019-05-29 NOTE — ED Provider Notes (Signed)
[facility] Emergency Department Provider Note   ____________________________________________   First MD Initiated Contact with Patient 05/29/19 1725     (approximate)  I have reviewed the triage vital signs and the nursing notes.   HISTORY  Chief Complaint Leg Swelling    HPI Karen Lewis is a 48 y.o. female who Dr. Doy Hutching called me about.  He he thought she was running a fever.  Additionally she is having pain and swelling on the right side of her abdomen in the area where she had a panniculectomy.  She had previously had symptoms on the left side but they have resolved.  Patient herself also reports she is having swelling in the right leg.  A little tense there.  She says she is weak and short of breath and is fallen twice and did she want any pain earlier today because she fell and broke 3 toes.  She says she gets weak and tired out just walking across the room.         Past Medical History:  Diagnosis Date   Anemia    Anginal pain (HCC)    Anxiety    Arthralgia of hip 07/29/2015   Arthritis    Arthritis, degenerative 07/29/2015   Asthma    Cephalalgia 07/25/2014   Dependence on unknown drug ([place])    multiplt controlled drug dependence   Depression    Diabetes mellitus without complication ([place])    Dysrhythmia    Eczema    Fibromyalgia    Gastritis    GERD (gastroesophageal reflux disease)    Gonalgia 07/29/2015   Overview:  Overview:  The patient has had bilateral intra-articular Hyalgan injections done on 07/16/2014 and although she seems to do well with this type of therapy, apparently her insurance company does not want to pay for they Hyalgan. On 11/27/2014 the patient underwent a bilateral genicular nerve block with excellent results. On 01/28/2015 she had a right knee genicular radiofrequency ablatio   Gout    H/O cardiovascular disorder 03/10/2015   H/O surgical procedure 12/05/2012   Overview:  LSG ([facility] - April  2013)     H/O thyroid disease 03/10/2015   Headache    Herpes    History of artificial joint 07/29/2015   History of hiatal hernia    Hypertension    Hypomagnesemia    Hypothyroidism    IDA (iron deficiency anemia) 05/28/2019   LBP (low back pain) 07/29/2015   Neuromuscular disorder ([place])    Obesity    PCOS (polycystic ovarian syndrome)    Primary osteoarthritis of both knees 07/29/2015   Sleep apnea    Thyroid nodule bilateral    Patient Active Problem List   Diagnosis Date Noted   IDA (iron deficiency anemia) 05/28/2019   Mycobacterium abscessus infection 02/22/2019   Surgical wound, non healing 09/27/2018   Wound infection after surgery 08/11/2018   Panniculitis 07/05/2018   Allergy to iodine 04/26/2018   H/O allergy to shellfish 04/26/2018   Encounter for dental examination 02/21/2018   Spondylosis without myelopathy or radiculopathy, lumbosacral region 02/21/2018   History of gastric bypass 02/15/2018   Depression 01/19/2018   Secondary Osteoarthritis of knee (Bilateral) (R>L) 01/02/2018   Encounter for dental exam and cleaning w/o abnormal findings 12/19/2017   Post op infection 12/05/2017   Osteoarthritis of hip (Right) 07/07/2017   Osteoarthritis of shoulder (Left) 05/31/2017   Upper extremity pain 04/21/2017   Chronic shoulder pain (Left) 04/21/2017   Chronic pain syndrome  12/02/2016   Neurogenic pain 12/02/2016   Chest pain with low risk of acute coronary syndrome 08/04/2016   SOB (shortness of breath) on exertion 08/04/2016   Dysphagia, unspecified 05/04/2016   Gastroesophageal reflux disease without esophagitis 05/04/2016   Vitamin D insufficiency 11/27/2015   Nontoxic goiter 11/12/2015   Other psychoactive substance dependence, uncomplicated ([place]) 123456   Controlled drug dependence ([place]) 10/13/2015   Lumbar spondylosis 09/18/2015   Hypomagnesemia 08/27/2015   Morbid obesity with body mass index (BMI)  greater than or equal to 70 in adult [facility]) 08/27/2015   Long term current use of opiate analgesic 07/29/2015   Long term prescription opiate use 07/29/2015   Opiate use 07/29/2015   Encounter for therapeutic drug level monitoring 07/29/2015   Opiate dependence ([place]) 07/29/2015   Chronic knee pain (Primary Area of Pain) (Bilateral) (R>L) 07/29/2015   Chronic low back pain (Third area of Pain) (Bilateral) (R>L) 07/29/2015   Lumbar facet syndrome (Bilateral) (R>L) 07/29/2015   Osteoarthritis 07/29/2015   Grade 1 (1.4 cm) Anterolisthesis of L4 over L5 07/29/2015   Chronic hip pain (Secondary area of Pain) (Right) 07/29/2015   S/P THR (total hip replacement) (Left) 07/29/2015   History of methicillin resistant staphylococcus aureus (MRSA) 07/29/2015   History of bariatric surgery 07/29/2015   History of methicillin resistant Staphylococcus aureus infection 07/29/2015   Eczema 05/21/2015   Gout 05/21/2015   Headache, migraine 05/21/2015   Bilateral polycystic ovarian syndrome 05/21/2015   Disease of thyroid gland 05/21/2015   Major depressive disorder, single episode 05/21/2015   Dermatitis 05/21/2015   Obstructive apnea 04/30/2015   History of asthma 03/10/2015   Binge eating disorder 03/10/2015   Fibromyalgia 03/10/2015   Anxiety, generalized 03/10/2015   Insomnia, persistent 03/10/2015   Depression, major, recurrent, moderate ([place]) 03/10/2015   Avitaminosis D 04/30/2014   Hyperlipidemia 04/25/2014   Hyperlipidemia due to type 2 diabetes mellitus ([place]) [id number]   Uncomplicated asthma [id number]   Asthma 03/19/2014   Goiter, nontoxic, multinodular 10/09/2013   Apnea, sleep 12/05/2012   History of surgical procedure 12/05/2012   Type 2 diabetes mellitus without complication, without long-term current use of insulin ([place]) 12/05/2012   Hypertension associated with diabetes ([place]) 12/05/2012    Past Surgical History:  Procedure Laterality Date    CARPAL TUNNEL RELEASE Bilateral    CHOLECYSTECTOMY     DIAGNOSTIC LAPAROSCOPY     HIATAL HERNIA REPAIR     JOINT REPLACEMENT Left hip   LAPAROSCOPIC PARTIAL GASTRECTOMY     left trigger finger     peniculectomy N/A 07/05/2018   ROUX-EN-Y GASTRIC BYPASS  06/03/2017   SHOULDER ARTHROSCOPY Right    THYROIDECTOMY N/A 11/12/2015   Procedure: THYROIDECTOMY;  Surgeon: Clyde Canterbury, MD;  Location: [facility] ORS;  Service: ENT;  Laterality: N/A;   TRIGGER FINGER RELEASE Right     Prior to Admission medications   Medication Sig Start Date End Date Taking? Authorizing Provider  ACCU-CHEK AVIVA PLUS test strip  03/13/19   [provider]  acyclovir (ZOVIRAX) 800 MG tablet Take 800 mg by mouth as needed. Reported on 11/12/2015    [provider]  albuterol (PROVENTIL HFA;VENTOLIN HFA) 108 (90 BASE) MCG/ACT inhaler Inhale 2 puffs into the lungs every 6 (six) hours as needed for wheezing or shortness of breath.    [provider]  amikacin (AMIKIN) 1 GM/4ML SOLN injection  03/22/19   [provider]  amitriptyline (ELAVIL) 100 MG tablet  01/12/18   [provider]  Biotin  10 MG CAPS     [provider]  clotrimazole (MYCELEX) 10 MG troche Take 10 mg by mouth as needed. 06/29/18   [provider]  diclofenac sodium (VOLTAREN) 1 % GEL Apply 2 g topically 4 (four) times daily. 05/29/19   Noemi Chapel, MD  diphenhydrAMINE (BENADRYL) 25 mg capsule Take 25 mg by mouth as needed.    [provider]  Emollient (A + D PERSONAL CARE LOTION EX) Apply 1 application topically as needed. 05/23/18   [provider]  EPINEPHrine 0.3 mg/0.3 mL IJ SOAJ injection Inject 0.3 mg into the skin as needed.    [provider]  fluconazole (DIFLUCAN) 100 MG tablet  04/30/19   [provider]  imipenem-cilastatin (PRIMAXIN) 500 MG injection Inject 1,000 mg into the vein every 12 (twelve) hours. 03/13/19   Tsosie Billing, MD    ketoconazole (NIZORAL) 2 % cream APPLY AS DIRECTED TO AFFECTED AREA TWICE DAILY AS NEEDED 12/17/14   [provider]  levothyroxine (SYNTHROID) 125 MCG tablet Take 250 mcg by mouth.  08/31/18 08/31/19  [provider]  lisinopril (ZESTRIL) 5 MG tablet Take by mouth. 12/11/18 12/11/19  [provider]  magnesium oxide (MAG-OX) 400 MG tablet Take 1 tablet (400 mg total) by mouth 2 (two) times daily. 06/09/19 09/07/19  Milinda Pointer, MD  metFORMIN (GLUCOPHAGE) 1000 MG tablet Take 1,000 mg by mouth 2 (two) times daily with a meal.     [provider]  metoprolol tartrate (LOPRESSOR) 50 MG tablet Take 50 mg by mouth 2 (two) times daily.  03/16/19   [provider]  montelukast (SINGULAIR) 10 MG tablet Take 10 mg by mouth at bedtime.     [provider]  naloxone [facility]) nasal spray 4 mg/0.1 mL Spray into one nostril. Repeat with second device into other nostril after 2-3 minutes if no or minimal response. 02/08/19   Milinda Pointer, MD  nystatin (MYCOSTATIN) 100000 UNIT/ML suspension Swish and swallow 2 mLs 4 (four) times daily 02/06/19   [provider]  omeprazole (PRILOSEC) 40 MG capsule Take 40 mg by mouth 2 (two) times daily.     [provider]  ondansetron (ZOFRAN-ODT) 4 MG disintegrating tablet Take 4 mg by mouth as needed. 07/11/18   [provider]  oxyCODONE (OXY IR/ROXICODONE) 5 MG immediate release tablet Take 1 tablet (5 mg total) by mouth 5 (five) times daily. Must last 30 days 06/09/19 07/09/19  Milinda Pointer, MD  oxyCODONE (OXY IR/ROXICODONE) 5 MG immediate release tablet Take 1 tablet (5 mg total) by mouth 5 (five) times daily. Must last 30 days 07/09/19 08/08/19  Milinda Pointer, MD  oxyCODONE (OXY IR/ROXICODONE) 5 MG immediate release tablet Take 1 tablet (5 mg total) by mouth 5 (five) times daily. Must last 30 days 08/08/19 09/07/19  Milinda Pointer, MD  pregabalin (LYRICA) 225 MG capsule Take 1  capsule (225 mg total) by mouth 2 (two) times daily. 06/09/19 09/07/19  Milinda Pointer, MD  QUEtiapine (SEROQUEL) 100 MG tablet Take 100 mg by mouth at bedtime.    [provider]  rosuvastatin (CRESTOR) 40 MG tablet  02/28/19   [provider]  sertraline (ZOLOFT) 100 MG tablet Take 200 mg by mouth daily.  03/13/19   [provider]  spironolactone (ALDACTONE) 25 MG tablet Take 37.5 mg by mouth 2 (two) times daily.     [provider]  tigecycline (TYGACIL) 50 MG injection Inject 50 mg into the vein every 12 (twelve) hours.  [provider]  tiZANidine (ZANAFLEX) 4 MG tablet Take by mouth. 02/28/19   [provider]  triamcinolone (NASACORT AQ) 55 MCG/ACT AERO nasal inhaler 2 sprays by Nasal route 2 (two) times daily.    [provider]  TYGACIL 50 MG injection  04/17/19   [provider]  vitamin C (ASCORBIC ACID) 500 MG tablet Take 500 mg by mouth daily.    [provider]  Vitamin D, Ergocalciferol, (DRISDOL) 1.25 MG (50000 UT) CAPS capsule  03/29/19   [provider]  zolpidem (AMBIEN) 10 MG tablet Take 10 mg by mouth at bedtime as needed for sleep.    [provider]  zonisamide (ZONEGRAN) 50 MG capsule Take 150 mg by mouth daily. 3 tabs at bedtime    [provider]    Allergies Bactrim [sulfamethoxazole-trimethoprim], Omalizumab, Ciprofloxacin, Shellfish allergy, Aspirin, Clindamycin, Motrin [ibuprofen], Nsaids, Other, and Sulfa antibiotics  Family History  Problem Relation Age of Onset   Anxiety disorder Mother    Depression Mother    Alcohol abuse Mother    Diabetes Mother    Hypertension Mother    Kidney cancer Mother    Sleep apnea Mother    Alcohol abuse Father    Anxiety disorder Father    Depression Father    Post-traumatic stress disorder Father    Kidney failure Father    COPD Father    Diabetes Father    Hypertension Father    Sleep apnea  Father    Depression Brother    Diabetes Brother    Hypertension Brother    Sleep apnea Brother    Breast cancer Paternal Aunt    Bladder Cancer Neg Hx    Prostate cancer Neg Hx     Social History Social History   Tobacco Use   Smoking status: Never Smoker   Smokeless tobacco: Never Used  Substance Use Topics   Alcohol use: No    Alcohol/week: 0.0 standard drinks   Drug use: No    Review of Systems  Constitutional: No fever/chills Eyes: No visual changes. ENT: No sore throat. Cardiovascular: Denies chest pain. Respiratory: Denies shortness of breath. Gastrointestinal: Some pain and swelling in the right side of the abdomen the area of the hip.  There is some peau d'orange there..  No nausea, no vomiting.  No diarrhea.  No constipation. Genitourinary: Negative for dysuria. Musculoskeletal: Negative for back pain.  She has some pain on the right side about T8 that radiates around toward the front where she fell. Skin: Negative for rash. Neurological: Negative for headaches, focal weakness o  ____________________________________________   PHYSICAL EXAM:  VITAL SIGNS: ED Triage Vitals  Enc Vitals Group     BP 05/29/19 1544 (!) 130/53     Pulse Rate 05/29/19 1544 89     Resp 05/29/19 1544 18     Temp 05/29/19 1544 98.9 F (37.2 C)     Temp Source 05/29/19 1544 Oral     SpO2 05/29/19 1544 99 %     Weight 05/29/19 1545 (!) 371 lb (168.3 kg)     Height 05/29/19 1545 5\' 4"  (1.626 m)     Head Circumference --      Peak Flow --      Pain Score 05/29/19 1544 7     Pain Loc --      Pain Edu? --      Excl. in [place]? --     Constitutional: Alert and oriented. Well appearing and in no  acute distress. Eyes: Conjunctivae are normal. Head: Atraumatic. Nose: No congestion/rhinnorhea. Mouth/Throat: Mucous membranes are moist.  Oropharynx non-erythematous. Neck: No stridor.   Cardiovascular: Normal rate, regular rhythm. Grossly normal heart sounds.  Good peripheral  circulation. Respiratory: Normal respiratory effort.  No retractions. Lungs CTAB. Gastrointestinal: Soft there is some peau d'orange skin on the right side that is a little tender.  Is not red.  Not warm.  No distention. No abdominal bruits. No CVA tenderness. Musculoskeletal: There is some right leg swelling and pitting edema.  There is no redness however. Neurologic:  Normal speech and language. No gross focal neurologic deficits are appreciated.  Skin:  Skin is warm, dry and intact. No rash noted.   ____________________________________________   LABS (all labs ordered are listed, but only abnormal results are displayed)  Labs Reviewed  CBC WITH DIFFERENTIAL/PLATELET - Abnormal; Notable for the following components:      Result Value   WBC 12.3 (*)    Hemoglobin 8.6 (*)    HCT 28.6 (*)    MCV 73.0 (*)    MCH 21.9 (*)    RDW 24.8 (*)    Neutro Abs 9.0 (*)    All other components within normal limits  COMPREHENSIVE METABOLIC PANEL - Abnormal; Notable for the following components:   CO2 21 (*)    Glucose, Bld 119 (*)    BUN 22 (*)    Creatinine, Ser 1.05 (*)    Calcium 6.9 (*)    Total Protein 6.0 (*)    Albumin 2.8 (*)    AST 169 (*)    ALT 86 (*)    Alkaline Phosphatase 190 (*)    All other components within normal limits  MAGNESIUM - Abnormal; Notable for the following components:   Magnesium 1.6 (*)    All other components within normal limits  BRAIN NATRIURETIC PEPTIDE  TROPONIN I (HIGH SENSITIVITY)  TROPONIN I (HIGH SENSITIVITY)   ____________________________________________  EKG  EKG read interpreted by me shows normal sinus rhythm rate of 86 left axis I PVC decreased amplitude but no acute changes ____________________________________________  RADIOLOGY  ED MD interpretation: CT only shows some edema on the right side moderate with soft tissue stranding.  I reviewed the CT and the chest x-ray.  The chest x-ray was read as negative.  I reviewed the film  2.  Official radiology report(s): Dg Chest 2 View  Result Date: 05/29/2019 CLINICAL DATA:  Fall. EXAM: CHEST - 2 VIEW COMPARISON:  02/03/2018 and prior radiographs. FINDINGS: The cardiomediastinal silhouette is unremarkable. A LEFT PICC line is noted with tip difficult to visualize but appears to overlie the UPPER RIGHT atrium. There is no evidence of focal airspace disease, pulmonary edema, suspicious pulmonary nodule/mass, pleural effusion, or pneumothorax. No acute bony abnormalities are identified. IMPRESSION: No active cardiopulmonary disease. Electronically Signed   By: Margarette Canada M.D.   On: 05/29/2019 17:43   Ct Abdomen Pelvis W Contrast  Result Date: 05/29/2019 CLINICAL DATA:  Abdominal pain and distension EXAM: CT ABDOMEN AND PELVIS WITH CONTRAST TECHNIQUE: Multidetector CT imaging of the abdomen and pelvis was performed using the standard protocol following bolus administration of intravenous contrast. CONTRAST:  169mL OMNIPAQUE IOHEXOL 300 MG/ML  SOLN COMPARISON:  01/10/2019 CT FINDINGS: Lower chest: Lung bases demonstrate no acute consolidation or effusion. The heart size is within normal limits. Hepatobiliary: No focal liver abnormality is seen. Status post cholecystectomy. No biliary dilatation. Pancreas: Unremarkable. No pancreatic ductal dilatation or surrounding inflammatory changes. Spleen: Normal in  size without focal abnormality. Adrenals/Urinary Tract: Adrenal glands are normal. No hydronephrosis. Punctate stones in the lower pole left kidney. Bladder is normal Stomach/Bowel: Status post gastric bypass. No dilated small bowel. No colon wall thickening. Negative appendix. Vascular/Lymphatic: Mild aortic atherosclerosis. No aneurysm. IVC filter with apex below the level of the renal veins. No significantly enlarged lymph node . Reproductive: Uterus and bilateral adnexa are unremarkable. Uterus is somewhat atrophic in appearance. Other: Negative for free air or free fluid. Soft tissue  thickening at the umbilicus with fat containing umbilical hernia. Moderate generalized edema and soft tissue stranding within the subcutaneous fat. Resolved left abdominal fluid collections with residual soft tissue thickening and calcifications in the region. Linear soft tissue density extending towards the skin surface at the left lower quadrant may reflect previously demonstrated fistula. No gas or fluid in the tract at this time. Musculoskeletal: No acute or suspicious osseous abnormality. Advanced arthritis right hip. Status post left hip replacement IMPRESSION: 1. No CT evidence for acute intra-abdominal or pelvic abnormality. 2. Moderate edema and soft tissue stranding within the subcutaneous fat of the abdominal wall. Resolved left lower lateral subcutaneous fluid collections with residual thickening and calcification in the region. Linear soft tissue densities extend to the skin surface and may reflect previously demonstrated fistulas. 3. Nonobstructing left kidney stones Electronically Signed   By: Donavan Foil M.D.   On: 05/29/2019 17:14   US Venous Img Lower Unilateral Right  Result Date: 05/29/2019 CLINICAL DATA:  48 year old female with RIGHT LOWER extremity pain and swelling for 3 weeks. EXAM: RIGHT LOWER EXTREMITY VENOUS DOPPLER ULTRASOUND TECHNIQUE: Gray-scale sonography with graded compression, as well as color Doppler and duplex ultrasound were performed to evaluate the lower extremity deep venous systems from the level of the common femoral vein and including the common femoral, femoral, profunda femoral, popliteal and calf veins including the posterior tibial, peroneal and gastrocnemius veins when visible. The superficial great saphenous vein was also interrogated. Spectral Doppler was utilized to evaluate flow at rest and with distal augmentation maneuvers in the common femoral, femoral and popliteal veins. COMPARISON:  None. FINDINGS: Contralateral LEFT common Femoral Vein: Respiratory  phasicity is normal and symmetric with the symptomatic side. No evidence of thrombus. Normal compressibility. Normal flow, compressibility, and augmentation within the RIGHT distal common femoral, proximal profunda femoral, proximal greater saphenous, entire femoral, popliteal veins, and imaged calf veins. IMPRESSION: No evidence of RIGHT deep venous thrombosis. Electronically Signed   By: Margarette Canada M.D.   On: 05/29/2019 18:56   Dg Foot Complete Left  Result Date: 05/29/2019 CLINICAL DATA:  Pt tripped while wearing flip flops causing fall. Pt c/o severe pain in proximal Lt great toe with radiation into metatarsals EXAM: LEFT FOOT - COMPLETE 3+ VIEW COMPARISON:  None. FINDINGS: There are nondisplaced oblique for fractures involving the first toe proximal phalanx in the midshaft, second toe proximal phalanx near the base, and also the third toe proximal phalanx in the midshaft. No evidence of dislocation. There are arthritic changes most pronounced in the mid and hindfoot. IMPRESSION: Nondisplaced fractures involving the first through third toe proximal phalanges in the left foot as above. Electronically Signed   By: Audie Pinto M.D.   On: 05/29/2019 12:52    ____________________________________________   PROCEDURES  Procedure(s) performed (including Critical Care):  Procedures discussed patient with Dr. Doy Hutching and then discussed patient with Dr. Colletta [place] who came to see the patient.  Patient also told her that she was very weak and recently she  saw the swelling to.  I discussed the patient with Dr. Karoline Caldwell her surgeon who also reviewed the CT scan and labs wants her to eat more protein he will follow her up on Thursday.  Dr. Max Sane the hospitalist and came down to see the patient she was very reluctant to leave.  He evaluated her in person and reviewed the labs and CT scan and agreed with me and the other doctors that she could leave.  He managed to talk her in doing this.  Dr. Doy Hutching will  arrange home health for the patient tomorrow and the surgeon will see her in 2 days.  Patient can return at any time if she is worse.   ____________________________________________   INITIAL IMPRESSION / ASSESSMENT AND PLAN / ED COURSE  Patient's BNP is negative her white count is 12 H&H is 26 and 28 she has elevated liver functions.  I discussed her with Dr. Max Sane the hospitalist who reviewed her labs and CTs cannot really find a reason to admit her.  I also discussed her case with Dr. Steva Ready ID who feels she is improving.  She was on 3 different very powerful antibiotics but Ravisankar does not feel she needs any further antibiotics at this point.  We will get an ultrasound to look at the swelling she had one last month as well.  We will try to get a hold of Dr. Gae Dry of her surgeon who did the panniculectomy.  I have his cell phone number [phone] which I have left a message on.  Call Dr. Doy Hutching back in a minute.             ____________________________________________   FINAL CLINICAL IMPRESSION(S) / ED DIAGNOSES  Final diagnoses:  Peripheral edema     ED Discharge Orders    None       Note:  This document was prepared using Dragon voice recognition software and may include unintentional dictation errors.    Nena Polio, MD 05/30/19 [phone]

## 2019-05-30 ENCOUNTER — Telehealth: Payer: Self-pay | Admitting: Licensed Clinical Social Worker

## 2019-05-30 ENCOUNTER — Inpatient Hospital Stay: Payer: Medicare HMO

## 2019-05-30 NOTE — Telephone Encounter (Signed)
Spoke to the patient and she will start the medication tomorrow after her blood work

## 2019-05-30 NOTE — Telephone Encounter (Signed)
-----   Message from Tsosie Billing, MD sent at 05/30/2019 12:26 PM EDT ----- Please ask Jarin to start clofazimine tomorrow after the nurse draws the blood. thx

## 2019-05-31 DIAGNOSIS — Z452 Encounter for adjustment and management of vascular access device: Secondary | ICD-10-CM | POA: Diagnosis not present

## 2019-05-31 DIAGNOSIS — G43909 Migraine, unspecified, not intractable, without status migrainosus: Secondary | ICD-10-CM | POA: Diagnosis not present

## 2019-05-31 DIAGNOSIS — G8929 Other chronic pain: Secondary | ICD-10-CM | POA: Diagnosis not present

## 2019-05-31 DIAGNOSIS — E1140 Type 2 diabetes mellitus with diabetic neuropathy, unspecified: Secondary | ICD-10-CM | POA: Diagnosis not present

## 2019-05-31 DIAGNOSIS — T8149XA Infection following a procedure, other surgical site, initial encounter: Secondary | ICD-10-CM | POA: Diagnosis not present

## 2019-05-31 DIAGNOSIS — I890 Lymphedema, not elsewhere classified: Secondary | ICD-10-CM | POA: Diagnosis not present

## 2019-05-31 DIAGNOSIS — F329 Major depressive disorder, single episode, unspecified: Secondary | ICD-10-CM | POA: Diagnosis not present

## 2019-05-31 DIAGNOSIS — T8142XA Infection following a procedure, deep incisional surgical site, initial encounter: Secondary | ICD-10-CM | POA: Diagnosis not present

## 2019-05-31 DIAGNOSIS — A319 Mycobacterial infection, unspecified: Secondary | ICD-10-CM | POA: Diagnosis not present

## 2019-05-31 DIAGNOSIS — R945 Abnormal results of liver function studies: Secondary | ICD-10-CM | POA: Diagnosis not present

## 2019-05-31 DIAGNOSIS — Z9884 Bariatric surgery status: Secondary | ICD-10-CM | POA: Diagnosis not present

## 2019-05-31 DIAGNOSIS — R6889 Other general symptoms and signs: Secondary | ICD-10-CM | POA: Diagnosis not present

## 2019-05-31 DIAGNOSIS — M793 Panniculitis, unspecified: Secondary | ICD-10-CM | POA: Diagnosis not present

## 2019-06-01 ENCOUNTER — Inpatient Hospital Stay: Payer: Medicare HMO

## 2019-06-01 ENCOUNTER — Other Ambulatory Visit: Payer: Self-pay

## 2019-06-01 VITALS — BP 103/68 | HR 94 | Temp 97.3°F | Resp 20

## 2019-06-01 DIAGNOSIS — R5383 Other fatigue: Secondary | ICD-10-CM | POA: Diagnosis not present

## 2019-06-01 DIAGNOSIS — Z803 Family history of malignant neoplasm of breast: Secondary | ICD-10-CM | POA: Diagnosis not present

## 2019-06-01 DIAGNOSIS — D509 Iron deficiency anemia, unspecified: Secondary | ICD-10-CM | POA: Diagnosis not present

## 2019-06-01 DIAGNOSIS — R0602 Shortness of breath: Secondary | ICD-10-CM | POA: Diagnosis not present

## 2019-06-01 DIAGNOSIS — D508 Other iron deficiency anemias: Secondary | ICD-10-CM | POA: Diagnosis not present

## 2019-06-01 DIAGNOSIS — Z9884 Bariatric surgery status: Secondary | ICD-10-CM | POA: Diagnosis not present

## 2019-06-01 DIAGNOSIS — M7989 Other specified soft tissue disorders: Secondary | ICD-10-CM | POA: Diagnosis not present

## 2019-06-01 DIAGNOSIS — D729 Disorder of white blood cells, unspecified: Secondary | ICD-10-CM | POA: Diagnosis not present

## 2019-06-01 MED ORDER — IRON SUCROSE 20 MG/ML IV SOLN
200.0000 mg | Freq: Once | INTRAVENOUS | Status: AC
  Administered 2019-06-01: 200 mg via INTRAVENOUS
  Filled 2019-06-01: qty 10

## 2019-06-01 MED ORDER — HEPARIN SOD (PORK) LOCK FLUSH 100 UNIT/ML IV SOLN
INTRAVENOUS | Status: AC
  Filled 2019-06-01: qty 5

## 2019-06-01 MED ORDER — SODIUM CHLORIDE 0.9 % IV SOLN
Freq: Once | INTRAVENOUS | Status: AC
  Administered 2019-06-01: 14:00:00 via INTRAVENOUS
  Filled 2019-06-01: qty 250

## 2019-06-01 MED ORDER — SODIUM CHLORIDE 0.9 % IV SOLN: 200.0000 mg | Freq: Once | INTRAVENOUS | Status: DC

## 2019-06-02 DIAGNOSIS — A319 Mycobacterial infection, unspecified: Secondary | ICD-10-CM | POA: Diagnosis not present

## 2019-06-02 DIAGNOSIS — R0602 Shortness of breath: Secondary | ICD-10-CM | POA: Diagnosis not present

## 2019-06-02 DIAGNOSIS — D649 Anemia, unspecified: Secondary | ICD-10-CM | POA: Diagnosis not present

## 2019-06-02 DIAGNOSIS — R531 Weakness: Secondary | ICD-10-CM | POA: Diagnosis not present

## 2019-06-02 DIAGNOSIS — E43 Unspecified severe protein-calorie malnutrition: Secondary | ICD-10-CM | POA: Diagnosis not present

## 2019-06-02 DIAGNOSIS — E1121 Type 2 diabetes mellitus with diabetic nephropathy: Secondary | ICD-10-CM | POA: Diagnosis not present

## 2019-06-02 DIAGNOSIS — N179 Acute kidney failure, unspecified: Secondary | ICD-10-CM | POA: Diagnosis not present

## 2019-06-02 DIAGNOSIS — Z20828 Contact with and (suspected) exposure to other viral communicable diseases: Secondary | ICD-10-CM | POA: Diagnosis not present

## 2019-06-02 DIAGNOSIS — Z6841 Body Mass Index (BMI) 40.0 and over, adult: Secondary | ICD-10-CM | POA: Diagnosis not present

## 2019-06-02 DIAGNOSIS — D509 Iron deficiency anemia, unspecified: Secondary | ICD-10-CM | POA: Diagnosis not present

## 2019-06-02 DIAGNOSIS — D508 Other iron deficiency anemias: Secondary | ICD-10-CM | POA: Diagnosis not present

## 2019-06-02 DIAGNOSIS — B9562 Methicillin resistant Staphylococcus aureus infection as the cause of diseases classified elsewhere: Secondary | ICD-10-CM | POA: Diagnosis not present

## 2019-06-02 DIAGNOSIS — R600 Localized edema: Secondary | ICD-10-CM | POA: Diagnosis not present

## 2019-06-02 DIAGNOSIS — Z79899 Other long term (current) drug therapy: Secondary | ICD-10-CM | POA: Diagnosis not present

## 2019-06-02 DIAGNOSIS — R945 Abnormal results of liver function studies: Secondary | ICD-10-CM | POA: Diagnosis not present

## 2019-06-02 DIAGNOSIS — R918 Other nonspecific abnormal finding of lung field: Secondary | ICD-10-CM | POA: Diagnosis not present

## 2019-06-02 DIAGNOSIS — E1169 Type 2 diabetes mellitus with other specified complication: Secondary | ICD-10-CM | POA: Diagnosis not present

## 2019-06-03 DIAGNOSIS — Z79899 Other long term (current) drug therapy: Secondary | ICD-10-CM | POA: Diagnosis not present

## 2019-06-03 DIAGNOSIS — A319 Mycobacterial infection, unspecified: Secondary | ICD-10-CM | POA: Diagnosis not present

## 2019-06-03 DIAGNOSIS — R627 Adult failure to thrive: Secondary | ICD-10-CM | POA: Insufficient documentation

## 2019-06-03 DIAGNOSIS — R0602 Shortness of breath: Secondary | ICD-10-CM | POA: Diagnosis not present

## 2019-06-04 DIAGNOSIS — A319 Mycobacterial infection, unspecified: Secondary | ICD-10-CM | POA: Diagnosis not present

## 2019-06-04 DIAGNOSIS — D508 Other iron deficiency anemias: Secondary | ICD-10-CM | POA: Diagnosis not present

## 2019-06-04 DIAGNOSIS — R600 Localized edema: Secondary | ICD-10-CM | POA: Insufficient documentation

## 2019-06-04 DIAGNOSIS — D649 Anemia, unspecified: Secondary | ICD-10-CM | POA: Diagnosis not present

## 2019-06-04 DIAGNOSIS — R945 Abnormal results of liver function studies: Secondary | ICD-10-CM | POA: Diagnosis not present

## 2019-06-04 DIAGNOSIS — R0602 Shortness of breath: Secondary | ICD-10-CM | POA: Diagnosis not present

## 2019-06-04 DIAGNOSIS — N179 Acute kidney failure, unspecified: Secondary | ICD-10-CM | POA: Diagnosis not present

## 2019-06-04 DIAGNOSIS — R531 Weakness: Secondary | ICD-10-CM | POA: Diagnosis not present

## 2019-06-05 ENCOUNTER — Telehealth: Payer: Self-pay

## 2019-06-05 DIAGNOSIS — D649 Anemia, unspecified: Secondary | ICD-10-CM | POA: Diagnosis not present

## 2019-06-05 DIAGNOSIS — A319 Mycobacterial infection, unspecified: Secondary | ICD-10-CM | POA: Diagnosis not present

## 2019-06-05 DIAGNOSIS — N179 Acute kidney failure, unspecified: Secondary | ICD-10-CM | POA: Diagnosis not present

## 2019-06-05 DIAGNOSIS — D508 Other iron deficiency anemias: Secondary | ICD-10-CM | POA: Diagnosis not present

## 2019-06-05 DIAGNOSIS — R531 Weakness: Secondary | ICD-10-CM | POA: Diagnosis not present

## 2019-06-05 DIAGNOSIS — R0602 Shortness of breath: Secondary | ICD-10-CM | POA: Diagnosis not present

## 2019-06-05 DIAGNOSIS — R945 Abnormal results of liver function studies: Secondary | ICD-10-CM | POA: Diagnosis not present

## 2019-06-05 MED ORDER — ALBA-3 EX
1600.00 | CUTANEOUS | Status: DC
Start: 2019-06-05 — End: 2019-06-05

## 2019-06-05 MED ORDER — BAYER WOMENS 81-300 MG PO TABS
40.00 | ORAL_TABLET | ORAL | Status: DC
Start: 2019-06-06 — End: 2019-06-05

## 2019-06-05 MED ORDER — VIREAD 150 MG PO TABS
ORAL_TABLET | ORAL | Status: DC
Start: 2019-06-05 — End: 2019-06-05

## 2019-06-05 MED ORDER — GENERIC EXTERNAL MEDICATION
500.00 | Status: DC
Start: 2019-06-08 — End: 2019-06-05

## 2019-06-05 MED ORDER — Medication
500.00 | Status: DC
Start: 2019-06-08 — End: 2019-06-05

## 2019-06-05 MED ORDER — AMITRIPTYLINE HCL 25 MG PO TABS
100.00 | ORAL_TABLET | ORAL | Status: DC
Start: 2019-06-07 — End: 2019-06-05

## 2019-06-05 MED ORDER — BROMHIST-NR PO
0.00 | ORAL | Status: DC
Start: 2019-06-08 — End: 2019-06-05

## 2019-06-05 MED ORDER — Medication
5.00 | Status: DC
Start: ? — End: 2019-06-05

## 2019-06-05 MED ORDER — FEXOFENADINE HCL 180 MG PO TABS
180.00 | ORAL_TABLET | ORAL | Status: DC
Start: 2019-06-08 — End: 2019-06-05

## 2019-06-05 MED ORDER — GENERIC EXTERNAL MEDICATION
100.00 | Status: DC
Start: 2019-06-08 — End: 2019-06-05

## 2019-06-05 MED ORDER — ENOXAPARIN SODIUM 40 MG/0.4ML ~~LOC~~ SOLN
40.00 | SUBCUTANEOUS | Status: DC
Start: 2019-06-07 — End: 2019-06-05

## 2019-06-05 MED ORDER — Medication
225.00 | Status: DC
Start: 2019-06-08 — End: 2019-06-05

## 2019-06-05 MED ORDER — TUSSI PRES-B 2-15-200 MG/5ML PO LIQD
0.50 | ORAL | Status: DC
Start: ? — End: 2019-06-05

## 2019-06-05 MED ORDER — GLUCAGON HCL RDNA (DIAGNOSTIC) 1 MG IJ SOLR
1.00 | INTRAMUSCULAR | Status: DC
Start: ? — End: 2019-06-05

## 2019-06-05 MED ORDER — DERMACERIN EX
4.00 | CUTANEOUS | Status: DC
Start: ? — End: 2019-06-05

## 2019-06-05 MED ORDER — GENERIC EXTERNAL MEDICATION
150.00 | Status: DC
Start: 2019-06-07 — End: 2019-06-05

## 2019-06-05 MED ORDER — LEVOTHYROXINE SODIUM 125 MCG PO TABS
250.00 | ORAL_TABLET | ORAL | Status: DC
Start: 2019-06-08 — End: 2019-06-05

## 2019-06-05 MED ORDER — SERTRALINE HCL 100 MG PO TABS
200.00 | ORAL_TABLET | ORAL | Status: DC
Start: 2019-06-08 — End: 2019-06-05

## 2019-06-05 MED ORDER — ROSUVASTATIN CALCIUM 20 MG PO TABS
20.00 | ORAL_TABLET | ORAL | Status: DC
Start: 2019-06-08 — End: 2019-06-05

## 2019-06-05 MED ORDER — LIDOCAINE HCL 1 % IJ SOLN
0.50 | INTRAMUSCULAR | Status: DC
Start: ? — End: 2019-06-05

## 2019-06-05 MED ORDER — BROMFED DM PO
100.00 | ORAL | Status: DC
Start: 2019-06-07 — End: 2019-06-05

## 2019-06-05 MED ORDER — BL FLU/COLD/COUGH 60-4-20-650 MG PO PACK
200.00 | PACK | ORAL | Status: DC
Start: 2019-06-08 — End: 2019-06-05

## 2019-06-05 MED ORDER — ALBUTEROL SULFATE HFA 108 (90 BASE) MCG/ACT IN AERS
2.00 | INHALATION_SPRAY | RESPIRATORY_TRACT | Status: DC
Start: ? — End: 2019-06-05

## 2019-06-05 MED ORDER — GENERIC EXTERNAL MEDICATION
2000.00 | Status: DC
Start: 2019-06-08 — End: 2019-06-05

## 2019-06-05 MED ORDER — MULTI-VITAMIN PO TABS
2.00 | ORAL_TABLET | ORAL | Status: DC
Start: 2019-06-08 — End: 2019-06-05

## 2019-06-05 MED ORDER — DEXTROSE 50 % IV SOLN
12.50 | INTRAVENOUS | Status: DC
Start: ? — End: 2019-06-05

## 2019-06-05 NOTE — Telephone Encounter (Signed)
-----   Message from Karen Lewis sent at 06/05/2019 10:37 AM EDT ----- Regarding: Pt Currently Admitted in [place],  I called this pt to do pre-appt screen but she stated that she is currently admitted in the hospital at [facility] and wanted to let you know this. She states that she can still be reached at [phone] if further communication is needed. Thanks.

## 2019-06-06 ENCOUNTER — Inpatient Hospital Stay: Payer: Medicare HMO

## 2019-06-06 DIAGNOSIS — R531 Weakness: Secondary | ICD-10-CM | POA: Diagnosis not present

## 2019-06-06 DIAGNOSIS — D508 Other iron deficiency anemias: Secondary | ICD-10-CM | POA: Diagnosis not present

## 2019-06-06 DIAGNOSIS — A319 Mycobacterial infection, unspecified: Secondary | ICD-10-CM | POA: Diagnosis not present

## 2019-06-06 DIAGNOSIS — R0602 Shortness of breath: Secondary | ICD-10-CM | POA: Diagnosis not present

## 2019-06-06 DIAGNOSIS — R945 Abnormal results of liver function studies: Secondary | ICD-10-CM | POA: Diagnosis not present

## 2019-06-06 DIAGNOSIS — D649 Anemia, unspecified: Secondary | ICD-10-CM | POA: Diagnosis not present

## 2019-06-06 DIAGNOSIS — N179 Acute kidney failure, unspecified: Secondary | ICD-10-CM | POA: Diagnosis not present

## 2019-06-07 DIAGNOSIS — D508 Other iron deficiency anemias: Secondary | ICD-10-CM | POA: Diagnosis not present

## 2019-06-07 DIAGNOSIS — R0602 Shortness of breath: Secondary | ICD-10-CM | POA: Diagnosis not present

## 2019-06-07 DIAGNOSIS — A319 Mycobacterial infection, unspecified: Secondary | ICD-10-CM | POA: Diagnosis not present

## 2019-06-07 DIAGNOSIS — D649 Anemia, unspecified: Secondary | ICD-10-CM | POA: Diagnosis not present

## 2019-06-07 DIAGNOSIS — R531 Weakness: Secondary | ICD-10-CM | POA: Diagnosis not present

## 2019-06-07 DIAGNOSIS — R945 Abnormal results of liver function studies: Secondary | ICD-10-CM | POA: Diagnosis not present

## 2019-06-07 DIAGNOSIS — N179 Acute kidney failure, unspecified: Secondary | ICD-10-CM | POA: Diagnosis not present

## 2019-06-07 DIAGNOSIS — R7989 Other specified abnormal findings of blood chemistry: Secondary | ICD-10-CM | POA: Insufficient documentation

## 2019-06-08 ENCOUNTER — Other Ambulatory Visit: Payer: Self-pay | Admitting: Licensed Clinical Social Worker

## 2019-06-08 ENCOUNTER — Telehealth: Payer: Self-pay | Admitting: Licensed Clinical Social Worker

## 2019-06-08 ENCOUNTER — Other Ambulatory Visit: Payer: Self-pay | Admitting: Infectious Diseases

## 2019-06-08 DIAGNOSIS — Z452 Encounter for adjustment and management of vascular access device: Secondary | ICD-10-CM | POA: Diagnosis not present

## 2019-06-08 DIAGNOSIS — G43909 Migraine, unspecified, not intractable, without status migrainosus: Secondary | ICD-10-CM | POA: Diagnosis not present

## 2019-06-08 DIAGNOSIS — A319 Mycobacterial infection, unspecified: Secondary | ICD-10-CM | POA: Diagnosis not present

## 2019-06-08 DIAGNOSIS — E1140 Type 2 diabetes mellitus with diabetic neuropathy, unspecified: Secondary | ICD-10-CM | POA: Diagnosis not present

## 2019-06-08 DIAGNOSIS — G8929 Other chronic pain: Secondary | ICD-10-CM | POA: Diagnosis not present

## 2019-06-08 DIAGNOSIS — T8142XA Infection following a procedure, deep incisional surgical site, initial encounter: Secondary | ICD-10-CM | POA: Diagnosis not present

## 2019-06-08 DIAGNOSIS — M793 Panniculitis, unspecified: Secondary | ICD-10-CM | POA: Diagnosis not present

## 2019-06-08 DIAGNOSIS — F329 Major depressive disorder, single episode, unspecified: Secondary | ICD-10-CM | POA: Diagnosis not present

## 2019-06-08 MED ORDER — MAGNESIUM OXIDE 400 MG PO TABS
800.00 | ORAL_TABLET | ORAL | Status: DC
Start: 2019-06-07 — End: 2019-06-08

## 2019-06-08 MED ORDER — BUTALBITAL-APAP-CAFFEINE 50-325-40 MG PO TABS
1.00 | ORAL_TABLET | ORAL | Status: DC
Start: ? — End: 2019-06-08

## 2019-06-08 MED ORDER — TEDIZOLID PHOSPHATE 200 MG PO TABS: 200.0000 mg | ORAL_TABLET | Freq: Every day | ORAL | 2 refills | Status: DC

## 2019-06-08 MED ORDER — PRO HERBS ENERGY PO TABS
40.00 | ORAL_TABLET | ORAL | Status: DC
Start: 2019-06-08 — End: 2019-06-08

## 2019-06-08 MED ORDER — VIREAD 150 MG PO TABS
ORAL_TABLET | ORAL | Status: DC
Start: 2019-06-07 — End: 2019-06-08

## 2019-06-08 MED ORDER — OMADACYCLINE TOSYLATE 150 MG PO TABS: 300.0000 mg | ORAL_TABLET | Freq: Every day | ORAL | 1 refills | Status: DC

## 2019-06-08 MED ORDER — METOPROLOL TARTRATE 50 MG PO TABS
50.00 | ORAL_TABLET | ORAL | Status: DC
Start: 2019-06-07 — End: 2019-06-08

## 2019-06-08 NOTE — Telephone Encounter (Signed)
Patient was discharged yesterday on imipenem and tygacil. Patient states that they could not get her the oral antibiotic due to cost. Would like to know what Dr. Delaine Lame would like to do.

## 2019-06-08 NOTE — Telephone Encounter (Signed)
Medication Omadacycline 150 was sent in to the patients pharmacy but they were unable to order it. Pharmacist suggested to use an outpatient pharmacy. I checked with Magda Kiel, pharmacist at [facility] and she suggested [facility]. I will send it there.

## 2019-06-08 NOTE — Telephone Encounter (Signed)
Patient called stating that she received a call from [facility] stating that the drug Omadacycline was going to cost her 330 dollars monthly. This is after insurance and discounts applied. Patient states that she can't afford it and would like to see if [facility] could be cheaper. I explained this is a specialty drug that is hard to find, but I will check with [facility]. I will call them on Monday.

## 2019-06-08 NOTE — Telephone Encounter (Signed)
She should take the 2 IV imipenem and Tigecycline and Oral clofazamine- I am sending oral meds to her pharmacy to see whether they will approve or do PA- until then she will continue the current regimen- thx

## 2019-06-09 DIAGNOSIS — M793 Panniculitis, unspecified: Secondary | ICD-10-CM | POA: Diagnosis not present

## 2019-06-09 DIAGNOSIS — Z452 Encounter for adjustment and management of vascular access device: Secondary | ICD-10-CM | POA: Diagnosis not present

## 2019-06-09 DIAGNOSIS — G8929 Other chronic pain: Secondary | ICD-10-CM | POA: Diagnosis not present

## 2019-06-09 DIAGNOSIS — A319 Mycobacterial infection, unspecified: Secondary | ICD-10-CM | POA: Diagnosis not present

## 2019-06-09 DIAGNOSIS — G43909 Migraine, unspecified, not intractable, without status migrainosus: Secondary | ICD-10-CM | POA: Diagnosis not present

## 2019-06-09 DIAGNOSIS — R7881 Bacteremia: Secondary | ICD-10-CM | POA: Diagnosis not present

## 2019-06-09 DIAGNOSIS — F329 Major depressive disorder, single episode, unspecified: Secondary | ICD-10-CM | POA: Diagnosis not present

## 2019-06-09 DIAGNOSIS — T8142XA Infection following a procedure, deep incisional surgical site, initial encounter: Secondary | ICD-10-CM | POA: Diagnosis not present

## 2019-06-09 DIAGNOSIS — E1140 Type 2 diabetes mellitus with diabetic neuropathy, unspecified: Secondary | ICD-10-CM | POA: Diagnosis not present

## 2019-06-11 ENCOUNTER — Telehealth: Payer: Self-pay | Admitting: Pharmacist

## 2019-06-11 DIAGNOSIS — T8142XA Infection following a procedure, deep incisional surgical site, initial encounter: Secondary | ICD-10-CM | POA: Diagnosis not present

## 2019-06-11 DIAGNOSIS — G43909 Migraine, unspecified, not intractable, without status migrainosus: Secondary | ICD-10-CM | POA: Diagnosis not present

## 2019-06-11 DIAGNOSIS — G8929 Other chronic pain: Secondary | ICD-10-CM | POA: Diagnosis not present

## 2019-06-11 DIAGNOSIS — E1140 Type 2 diabetes mellitus with diabetic neuropathy, unspecified: Secondary | ICD-10-CM | POA: Diagnosis not present

## 2019-06-11 DIAGNOSIS — F329 Major depressive disorder, single episode, unspecified: Secondary | ICD-10-CM | POA: Diagnosis not present

## 2019-06-11 DIAGNOSIS — Z452 Encounter for adjustment and management of vascular access device: Secondary | ICD-10-CM | POA: Diagnosis not present

## 2019-06-11 DIAGNOSIS — A319 Mycobacterial infection, unspecified: Secondary | ICD-10-CM | POA: Diagnosis not present

## 2019-06-11 DIAGNOSIS — M793 Panniculitis, unspecified: Secondary | ICD-10-CM | POA: Diagnosis not present

## 2019-06-11 NOTE — Telephone Encounter (Signed)
[employer] approved prior authorization for Sivextro 200 mg tablets. The authorization is good until 09/13/2019

## 2019-06-11 NOTE — Telephone Encounter (Signed)
Ok great, thank you. 

## 2019-06-11 NOTE — Telephone Encounter (Signed)
Great, but we dont know still hwat her copay will be- sometimes they approve it and she still may have a huge copay. Can you ask her to check with her pharmacy? I did not speak to her on Friday as you had done and I did not have any more info- she can start taking this medicine instead of tigecycline IV once she gets it

## 2019-06-11 NOTE — Telephone Encounter (Signed)
Sent in refill application for clofazimine. Medication should arrive to clinic in 3-5 business days. Will update encounter when medication arrives.  Medication should be in clinic this week.  Patient has a follow up appointment with Dr. Delaine Lame next Tuesday 10/6.

## 2019-06-11 NOTE — Telephone Encounter (Signed)
I spoke to the patient and explained to her how to take the medication and that it will replace her Tigecycline. I also asked her to call to see how much her copay is before going to pick it up. I asked her to call me to let me know if she can afford it or not.

## 2019-06-12 ENCOUNTER — Other Ambulatory Visit: Payer: Self-pay

## 2019-06-12 ENCOUNTER — Telehealth: Payer: Self-pay

## 2019-06-12 DIAGNOSIS — S92912D Unspecified fracture of left toe(s), subsequent encounter for fracture with routine healing: Secondary | ICD-10-CM | POA: Diagnosis not present

## 2019-06-12 DIAGNOSIS — Z6841 Body Mass Index (BMI) 40.0 and over, adult: Secondary | ICD-10-CM | POA: Diagnosis not present

## 2019-06-12 DIAGNOSIS — K219 Gastro-esophageal reflux disease without esophagitis: Secondary | ICD-10-CM | POA: Diagnosis not present

## 2019-06-12 DIAGNOSIS — M793 Panniculitis, unspecified: Secondary | ICD-10-CM | POA: Diagnosis not present

## 2019-06-12 NOTE — Telephone Encounter (Signed)
Patient was called for her pre-screening process for appt scheduled tomorrow.  Patient reported that she was released from hospital and received Feraheme while in hospital.  Home health nurse drew her labs yesterday and will have her fax them to our office.    Per Dr. Tasia Catchings, [place] to cancel iron infusion appt for tomorrow and cancel the 10/7 infusion appt.  F/U with Dr. Tasia Catchings in 11/20 as scheduled.  appts cancelled and patient is aware.

## 2019-06-13 ENCOUNTER — Inpatient Hospital Stay: Payer: Medicare HMO

## 2019-06-13 DIAGNOSIS — E890 Postprocedural hypothyroidism: Secondary | ICD-10-CM | POA: Diagnosis not present

## 2019-06-13 DIAGNOSIS — Z23 Encounter for immunization: Secondary | ICD-10-CM | POA: Diagnosis not present

## 2019-06-13 DIAGNOSIS — E785 Hyperlipidemia, unspecified: Secondary | ICD-10-CM | POA: Diagnosis not present

## 2019-06-13 DIAGNOSIS — E1169 Type 2 diabetes mellitus with other specified complication: Secondary | ICD-10-CM | POA: Diagnosis not present

## 2019-06-13 DIAGNOSIS — E119 Type 2 diabetes mellitus without complications: Secondary | ICD-10-CM | POA: Diagnosis not present

## 2019-06-13 DIAGNOSIS — M1611 Unilateral primary osteoarthritis, right hip: Secondary | ICD-10-CM | POA: Diagnosis not present

## 2019-06-13 DIAGNOSIS — Z9884 Bariatric surgery status: Secondary | ICD-10-CM | POA: Diagnosis not present

## 2019-06-13 NOTE — Telephone Encounter (Signed)
Thanks a lot 

## 2019-06-13 NOTE — Telephone Encounter (Signed)
I spoke to [facility] yesterday and she will be checking with her pharmacy. She was feeling much better after her recent hospitalization and had received PRBC

## 2019-06-13 NOTE — Telephone Encounter (Addendum)
Patient's clofazimine has arrived to clinic (3 month supply). It will be couriered to [facility] clinic today and it will arrive tomorrow.

## 2019-06-14 DIAGNOSIS — T8142XA Infection following a procedure, deep incisional surgical site, initial encounter: Secondary | ICD-10-CM | POA: Diagnosis not present

## 2019-06-14 DIAGNOSIS — M793 Panniculitis, unspecified: Secondary | ICD-10-CM | POA: Diagnosis not present

## 2019-06-14 DIAGNOSIS — A318 Other mycobacterial infections: Secondary | ICD-10-CM | POA: Diagnosis not present

## 2019-06-14 DIAGNOSIS — R627 Adult failure to thrive: Secondary | ICD-10-CM | POA: Diagnosis not present

## 2019-06-14 DIAGNOSIS — E43 Unspecified severe protein-calorie malnutrition: Secondary | ICD-10-CM | POA: Diagnosis not present

## 2019-06-14 DIAGNOSIS — N179 Acute kidney failure, unspecified: Secondary | ICD-10-CM | POA: Diagnosis not present

## 2019-06-14 NOTE — Telephone Encounter (Signed)
Of course!

## 2019-06-15 DIAGNOSIS — E43 Unspecified severe protein-calorie malnutrition: Secondary | ICD-10-CM | POA: Diagnosis not present

## 2019-06-15 DIAGNOSIS — M793 Panniculitis, unspecified: Secondary | ICD-10-CM | POA: Diagnosis not present

## 2019-06-15 DIAGNOSIS — A318 Other mycobacterial infections: Secondary | ICD-10-CM | POA: Diagnosis not present

## 2019-06-15 DIAGNOSIS — T8142XA Infection following a procedure, deep incisional surgical site, initial encounter: Secondary | ICD-10-CM | POA: Diagnosis not present

## 2019-06-15 DIAGNOSIS — N179 Acute kidney failure, unspecified: Secondary | ICD-10-CM | POA: Diagnosis not present

## 2019-06-15 DIAGNOSIS — R627 Adult failure to thrive: Secondary | ICD-10-CM | POA: Diagnosis not present

## 2019-06-16 DIAGNOSIS — R7881 Bacteremia: Secondary | ICD-10-CM | POA: Diagnosis not present

## 2019-06-16 DIAGNOSIS — A319 Mycobacterial infection, unspecified: Secondary | ICD-10-CM | POA: Diagnosis not present

## 2019-06-18 DIAGNOSIS — T8142XA Infection following a procedure, deep incisional surgical site, initial encounter: Secondary | ICD-10-CM | POA: Diagnosis not present

## 2019-06-18 DIAGNOSIS — R627 Adult failure to thrive: Secondary | ICD-10-CM | POA: Diagnosis not present

## 2019-06-18 DIAGNOSIS — N179 Acute kidney failure, unspecified: Secondary | ICD-10-CM | POA: Diagnosis not present

## 2019-06-18 DIAGNOSIS — A318 Other mycobacterial infections: Secondary | ICD-10-CM | POA: Diagnosis not present

## 2019-06-18 DIAGNOSIS — M793 Panniculitis, unspecified: Secondary | ICD-10-CM | POA: Diagnosis not present

## 2019-06-18 DIAGNOSIS — E43 Unspecified severe protein-calorie malnutrition: Secondary | ICD-10-CM | POA: Diagnosis not present

## 2019-06-19 ENCOUNTER — Ambulatory Visit: Payer: Medicare HMO | Attending: Infectious Diseases | Admitting: Infectious Diseases

## 2019-06-19 ENCOUNTER — Encounter: Payer: Self-pay | Admitting: Infectious Diseases

## 2019-06-19 ENCOUNTER — Other Ambulatory Visit: Payer: Self-pay

## 2019-06-19 VITALS — BP 120/76 | HR 91 | Temp 98.4°F

## 2019-06-19 DIAGNOSIS — Z8619 Personal history of other infectious and parasitic diseases: Secondary | ICD-10-CM

## 2019-06-19 DIAGNOSIS — Z9884 Bariatric surgery status: Secondary | ICD-10-CM | POA: Diagnosis not present

## 2019-06-19 DIAGNOSIS — Z95828 Presence of other vascular implants and grafts: Secondary | ICD-10-CM | POA: Diagnosis not present

## 2019-06-19 DIAGNOSIS — Z888 Allergy status to other drugs, medicaments and biological substances status: Secondary | ICD-10-CM

## 2019-06-19 DIAGNOSIS — I10 Essential (primary) hypertension: Secondary | ICD-10-CM | POA: Diagnosis not present

## 2019-06-19 DIAGNOSIS — Z91013 Allergy to seafood: Secondary | ICD-10-CM

## 2019-06-19 DIAGNOSIS — Z792 Long term (current) use of antibiotics: Secondary | ICD-10-CM

## 2019-06-19 DIAGNOSIS — Z881 Allergy status to other antibiotic agents status: Secondary | ICD-10-CM

## 2019-06-19 DIAGNOSIS — M1611 Unilateral primary osteoarthritis, right hip: Secondary | ICD-10-CM | POA: Diagnosis not present

## 2019-06-19 DIAGNOSIS — Z9889 Other specified postprocedural states: Secondary | ICD-10-CM

## 2019-06-19 DIAGNOSIS — Z8614 Personal history of Methicillin resistant Staphylococcus aureus infection: Secondary | ICD-10-CM

## 2019-06-19 DIAGNOSIS — Z96642 Presence of left artificial hip joint: Secondary | ICD-10-CM

## 2019-06-19 DIAGNOSIS — Z8639 Personal history of other endocrine, nutritional and metabolic disease: Secondary | ICD-10-CM

## 2019-06-19 DIAGNOSIS — A311 Cutaneous mycobacterial infection: Secondary | ICD-10-CM

## 2019-06-19 DIAGNOSIS — A318 Other mycobacterial infections: Secondary | ICD-10-CM

## 2019-06-19 DIAGNOSIS — D509 Iron deficiency anemia, unspecified: Secondary | ICD-10-CM | POA: Diagnosis not present

## 2019-06-19 DIAGNOSIS — Z7984 Long term (current) use of oral hypoglycemic drugs: Secondary | ICD-10-CM

## 2019-06-19 DIAGNOSIS — E119 Type 2 diabetes mellitus without complications: Secondary | ICD-10-CM

## 2019-06-19 DIAGNOSIS — Z872 Personal history of diseases of the skin and subcutaneous tissue: Secondary | ICD-10-CM

## 2019-06-19 DIAGNOSIS — F339 Major depressive disorder, recurrent, unspecified: Secondary | ICD-10-CM | POA: Diagnosis not present

## 2019-06-19 DIAGNOSIS — A319 Mycobacterial infection, unspecified: Secondary | ICD-10-CM

## 2019-06-19 DIAGNOSIS — Z886 Allergy status to analgesic agent status: Secondary | ICD-10-CM

## 2019-06-19 DIAGNOSIS — Z79899 Other long term (current) drug therapy: Secondary | ICD-10-CM

## 2019-06-19 NOTE — Progress Notes (Signed)
NAME: Karen Lewis  DOB: 30-Mar-1971  MRN: [id number]  Date/Time: 06/19/2019 11:59 AM   Subjective:  ?Follow up visit  Karen Lewis is a 48 y.o. 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 Patient has been on treatment since June 30.  She is on IV imipenem, tigecycline and oral clofazimine currently. Patient was recently hospitalized to [place] between 06/02/2019 until 06/07/2023 failure to thrive, hypocalcemia edema of legs, elevated LFTs and anemia.  She was found to be some having symptomatic hypocalcemia and hypomagnesemia.  They thought that the hyporcalcemia was secondary to hypomagnesemia causing inhibitory effect on the PTH.  PTH was low and that corrected with magnesium supplementation.  She was asked to  take magnesium glycinate.  She was also given blood transfusion.  And  also received thousand milligrams of IV iron dextran.  They stopped the PPI.  ID at [facility] saw the patient and spoke to me as well they recommended imipenem clofazimine today's alert because of tigecycline and its effect on appetite.  There was a question whether the LFTs were high secondary due to clofazimine.  Patient since her discharge has been taking imipenem, tigecycline and clofazimine.  She has been feeling much better.  Her last lab work from 06/18/2019 shows AST and ALT of 44.  This is normalized.  Her hemoglobin now is 10.2. The abdominal wounds are healing very well.  There is only one spot which may be having some occasional discharge. She has no fever or chills. She will need 1 more month of antibiotics.  Initially try to obtain oral today's alert on oral omadacycline but because of a co-pay of more than $300 she is not able to afford those oral medications.  So she is currently continuing IV tigecycline, IV imipenem and oral clofazimine.  Patient has a complicated medical  history. She had a gastric sleeve in 2013 and lost 150 pounds initially.  She then started gaining weight and then underwent gastric bypass Roux-en-Y September 2018.  She had another 50 pound weight loss.  She underwent elective panniculectomy in October 2019.  The surgical site did not heal completely and had persistent discharge.  Initially the culture had Pseudomonas and MRSA and she was taking Levaquin and doxycycline.  She also had a wound VAC at one point.  On Feb 07, 2019 she underwent panniculectomy because of ongoing discharge erythema and pain.  The culture from that site grew Mycobacterium abscesses.  I saw her on 02/22/2019.  Repeat culture was sent from the drain and that also had Mycobacterium abscesses.  The bacteria was sensitive to amikacin, tigecycline and it was intermediate to imipenem and resistant to clarithromycin and moxifloxacin.  She had a PICC line placed on 03/13/2019.  She started initially on amikacin, imipenem and tigecycline all by IV route.  The abdominal drains were removed by the surgeon.  I saw her as follow-up on 03/28/2019 and as clofazimine was obtained we stopped the amikacin and started her on clofazimine. After 48 hours of starting clofazimine it was put on hold because of possibility of oral sores.  It was restarted the next week on 04/03/2019.  On 728 the LFTs started to climb and AST ALT were high.  She was also feeling very tired.  Initially the tigecycline was stopped on 04/20/2019.  But LFTs persisted and then the clofazimine was stopped and patient's LFTs became normal.  She  was restarted on tigecycline on 05/01/2019 and then later on amikacin.  Meanwhile she was complaining of fatigue and also swelling of her right leg.  She had a Doppler which was negative for DVT on 04/26/2019.  She saw the hematologist and there was a plan to get iron transfusion.  Meanwhile patient came to the ED of [facility] on 05/29/2019 for fatigue swelling of the arms and legs and a weight  gain of 20 pounds and feeling extremely tired.  She also had fallen and had fractured her first through third toe proximal phalanx in the left foot.  She was discharged from the ED and asked to follow-up with a primary care.  Labs done on 05/29/2019 showed an alkaline phosphatase of 190, AST of 169 and ALT of 86 and sodium of 139, potassium of 4.7 and creatinine of 1.05.  She had an imaging of the abdomen by CAT scan and that showed moderate edema and soft tissue stranding within the subcutaneous fat of the abdominal wall.  There was resolved left lower lateral subcutaneous fluid collections with residual thickening and calcification in the region.  Linear soft tissue densities extend to the skin surface and may reflect previous demonstrated fistula.   As she was feeling tired she went to [place] on 06/02/2019 and got admitted   Past Medical History:  Diagnosis Date  . Anemia   . Anginal pain ([place])   . Anxiety   . Arthralgia of hip 07/29/2015  . Arthritis   . Arthritis, degenerative 07/29/2015  . Asthma   . Cephalalgia 07/25/2014  . Dependence on unknown drug ([place])    multiplt controlled drug dependence  . Depression   . Diabetes mellitus without complication ([place])   . Dysrhythmia   . Eczema   . Fibromyalgia   . Gastritis   . GERD (gastroesophageal reflux disease)   . Gonalgia 07/29/2015   Overview:  Overview:  The patient has had bilateral intra-articular Hyalgan injections done on 07/16/2014 and although she seems to do well with this type of therapy, apparently her insurance company does not want to pay for they Hyalgan. On 11/27/2014 the patient underwent a bilateral genicular nerve block with excellent results. On 01/28/2015 she had a right knee genicular radiofrequency ablatio  . Gout   . H/O cardiovascular disorder 03/10/2015  . H/O surgical procedure 12/05/2012   Overview:  LSG ([facility] - April 2013)    . H/O thyroid disease 03/10/2015  . Headache   . Herpes   . History of artificial  joint 07/29/2015  . History of hiatal hernia   . Hypertension   . Hypomagnesemia   . Hypothyroidism   . IDA (iron deficiency anemia) 05/28/2019  . LBP (low back pain) 07/29/2015  . Neuromuscular disorder ([place])   . Obesity   . PCOS (polycystic ovarian syndrome)   . Primary osteoarthritis of both knees 07/29/2015  . Sleep apnea   . Thyroid nodule bilateral    Past Surgical History:  Procedure Laterality Date  . CARPAL TUNNEL RELEASE Bilateral   . CHOLECYSTECTOMY    . DIAGNOSTIC LAPAROSCOPY    . HIATAL HERNIA REPAIR    . JOINT REPLACEMENT Left hip  . LAPAROSCOPIC PARTIAL GASTRECTOMY    . left trigger finger    . peniculectomy N/A 07/05/2018  . ROUX-EN-Y GASTRIC BYPASS  06/03/2017  . SHOULDER ARTHROSCOPY Right   . THYROIDECTOMY N/A 11/12/2015   Procedure: THYROIDECTOMY;  Surgeon: Clyde Canterbury, MD;  Location: [facility] ORS;  Service: ENT;  Laterality: N/A;  .  TRIGGER FINGER RELEASE Right     Social History   Socioeconomic History  . Marital status: Significant Other    Spouse name: Not on file  . Number of children: Not on file  . Years of education: Not on file  . Highest education level: Not on file  Occupational History  . Not on file  Social Needs  . Financial resource strain: Not on file  . Food insecurity    Worry: Not on file    Inability: Not on file  . Transportation needs    Medical: Not on file    Non-medical: Not on file  Tobacco Use  . Smoking status: Never Smoker  . Smokeless tobacco: Never Used  Substance and Sexual Activity  . Alcohol use: No    Alcohol/week: 0.0 standard drinks  . Drug use: No  . Sexual activity: Not Currently    Birth control/protection: Injection  Lifestyle  . Physical activity    Days per week: Not on file    Minutes per session: Not on file  . Stress: Not on file  Relationships  . Social Herbalist on phone: Not on file    Gets together: Not on file    Attends religious service: Not on file    Active member of club  or organization: Not on file    Attends meetings of clubs or organizations: Not on file    Relationship status: Not on file  . Intimate partner violence    Fear of current or ex partner: Not on file    Emotionally abused: Not on file    Physically abused: Not on file    Forced sexual activity: Not on file  Other Topics Concern  . Not on file  Social History Narrative  . Not on file    Family History  Problem Relation Age of Onset  . Anxiety disorder Mother   . Depression Mother   . Alcohol abuse Mother   . Diabetes Mother   . Hypertension Mother   . Kidney cancer Mother   . Sleep apnea Mother   . Alcohol abuse Father   . Anxiety disorder Father   . Depression Father   . Post-traumatic stress disorder Father   . Kidney failure Father   . COPD Father   . Diabetes Father   . Hypertension Father   . Sleep apnea Father   . Depression Brother   . Diabetes Brother   . Hypertension Brother   . Sleep apnea Brother   . Breast cancer Paternal Aunt   . Bladder Cancer Neg Hx   . Prostate cancer Neg Hx    Allergies  Allergen Reactions  . Bactrim [Sulfamethoxazole-Trimethoprim] Hives  . Omalizumab Itching and Hives  . Ciprofloxacin Other (See Comments)    Muscle Pain Possible myalgias. myalgia Possible myalgias. myalgia  . Shellfish Allergy Other (See Comments)    unknown  . Aspirin Other (See Comments)    GI UPSET  . Clindamycin Rash  . Motrin [Ibuprofen]     Other reaction(s): Other (See Comments) GI UPSET  . Nsaids Nausea Only and Other (See Comments)    Stomach upset Other reaction(s): Other (See Comments) Stomach upset  . Other Hives  . Sulfa Antibiotics Hives    ? Current Outpatient Medications  Medication Sig Dispense Refill  . ACCU-CHEK AVIVA PLUS test strip     . acyclovir (ZOVIRAX) 800 MG tablet Take 800 mg by mouth as needed. Reported on 11/12/2015    .  albuterol (PROVENTIL HFA;VENTOLIN HFA) 108 (90 BASE) MCG/ACT inhaler Inhale 2 puffs into the lungs  every 6 (six) hours as needed for wheezing or shortness of breath.    Marland Kitchen amikacin (AMIKIN) 1 GM/4ML SOLN injection     . amitriptyline (ELAVIL) 100 MG tablet   3  . Biotin 10 MG CAPS     . calcium carbonate (TUMS EX) 750 MG chewable tablet Chew 3 tablets by mouth 3 (three) times daily.    . clotrimazole (MYCELEX) 10 MG troche Take 10 mg by mouth as needed.  3  . diclofenac sodium (VOLTAREN) 1 % GEL Apply 2 g topically 4 (four) times daily. 100 g 1  . diphenhydrAMINE (BENADRYL) 25 mg capsule Take 25 mg by mouth as needed.    . Emollient (A + D PERSONAL CARE LOTION EX) Apply 1 application topically as needed.    Marland Kitchen EPINEPHrine 0.3 mg/0.3 mL IJ SOAJ injection Inject 0.3 mg into the skin as needed.    . fluconazole (DIFLUCAN) 100 MG tablet     . imipenem-cilastatin (PRIMAXIN) 500 MG injection Inject 1,000 mg into the vein every 12 (twelve) hours. 1 each 0  . ketoconazole (NIZORAL) 2 % cream APPLY AS DIRECTED TO AFFECTED AREA TWICE DAILY AS NEEDED    . levothyroxine (SYNTHROID) 125 MCG tablet Take 250 mcg by mouth.     Marland Kitchen lisinopril (ZESTRIL) 5 MG tablet Take by mouth.    Marland Kitchen MAGNESIUM GLYCINATE PO Take 200 mg by mouth 4 (four) times daily.    . magnesium oxide (MAG-OX) 400 MG tablet Take 1 tablet (400 mg total) by mouth 2 (two) times daily. 180 tablet 0  . metFORMIN (GLUCOPHAGE) 1000 MG tablet Take 1,000 mg by mouth 2 (two) times daily with a meal.     . metoprolol tartrate (LOPRESSOR) 50 MG tablet Take 50 mg by mouth 2 (two) times daily.     . montelukast (SINGULAIR) 10 MG tablet Take 10 mg by mouth at bedtime.     . Multiple Vitamin (MULTI-VITAMIN) tablet Take 2 tablets by mouth daily.    . naloxone (NARCAN) nasal spray 4 mg/0.1 mL Spray into one nostril. Repeat with second device into other nostril after 2-3 minutes if no or minimal response. 1 kit 0  . nystatin (MYCOSTATIN) 100000 UNIT/ML suspension Swish and swallow 2 mLs 4 (four) times daily    . Omadacycline Tosylate 150 MG TABS Take 300 mg by  mouth daily. 30 tablet 1  . ondansetron (ZOFRAN-ODT) 4 MG disintegrating tablet Take 4 mg by mouth as needed.  0  . oxyCODONE (OXY IR/ROXICODONE) 5 MG immediate release tablet Take 1 tablet (5 mg total) by mouth 5 (five) times daily. Must last 30 days 150 tablet 0  . [START ON 07/09/2019] oxyCODONE (OXY IR/ROXICODONE) 5 MG immediate release tablet Take 1 tablet (5 mg total) by mouth 5 (five) times daily. Must last 30 days 150 tablet 0  . [START ON 08/08/2019] oxyCODONE (OXY IR/ROXICODONE) 5 MG immediate release tablet Take 1 tablet (5 mg total) by mouth 5 (five) times daily. Must last 30 days 150 tablet 0  . pregabalin (LYRICA) 225 MG capsule Take 1 capsule (225 mg total) by mouth 2 (two) times daily. 180 capsule 0  . QUEtiapine (SEROQUEL) 100 MG tablet Take 100 mg by mouth at bedtime.    . rosuvastatin (CRESTOR) 40 MG tablet     . sertraline (ZOLOFT) 100 MG tablet Take 200 mg by mouth daily.     Marland Kitchen spironolactone (  ALDACTONE) 25 MG tablet Take 37.5 mg by mouth 2 (two) times daily.     . Tedizolid Phosphate 200 MG TABS Take 200 mg by mouth daily. 30 tablet 2  . tigecycline (TYGACIL) 50 MG injection Inject 50 mg into the vein every 12 (twelve) hours.    Marland Kitchen tiZANidine (ZANAFLEX) 4 MG tablet Take by mouth.    . triamcinolone (NASACORT AQ) 55 MCG/ACT AERO nasal inhaler 2 sprays by Nasal route 2 (two) times daily.    Marland Kitchen TYGACIL 50 MG injection     . vitamin C (ASCORBIC ACID) 500 MG tablet Take 500 mg by mouth daily.    . Vitamin D, Ergocalciferol, (DRISDOL) 1.25 MG (50000 UT) CAPS capsule     . zolpidem (AMBIEN) 10 MG tablet Take 10 mg by mouth at bedtime as needed for sleep.    Marland Kitchen zonisamide (ZONEGRAN) 50 MG capsule Take 150 mg by mouth daily. 3 tabs at bedtime    . omeprazole (PRILOSEC) 40 MG capsule Take 40 mg by mouth 2 (two) times daily.      No current facility-administered medications for this visit.      Abtx:  Anti-infectives (From admission, onward)   None      REVIEW OF SYSTEMS:   Const: negative fever, negative chills, negative weight loss Eyes: negative diplopia or visual changes, negative eye pain ENT: negative coryza, negative sore throat Resp: negative cough, hemoptysis, dyspnea Cards: negative for chest pain, palpitations, lower extremity edema GU: negative for frequency, dysuria and hematuria GI: Negative for abdominal pain, occasional loose stool , no bleeding, constipation Skin: negative for rash and pruritus Heme: negative for easy bruising and gum/nose bleeding MS: Left hip pain, inability to walk because of arthritis in her hip and feet Neurolo:negative for headaches, dizziness, vertigo, memory problems  Psych: Some anxiety Allergy/Immunology-medication allergy as noted above. Objective:  VITALS:  BP 120/76 (BP Location: Right Arm, Patient Position: Sitting, Cuff Size: Normal)   Pulse 91   Temp 98.4 F (36.9 C) (Oral)  PHYSICAL EXAM:  General: Alert, cooperative, no distress, appears stated age.  BMI more than 50, in wheel chair Head: Normocephalic, without obvious abnormality, atraumatic. Eyes: Conjunctivae clear, anicteric sclerae. Pupils are equal ENT Nares normal. No drainage or sinus tenderness. Lips, mucosa, and tongue normal. No Thrush Neck: Supple, symmetrical, no adenopathy, thyroid: non tender no carotid bruit and no JVD. Lungs: Clear to auscultation bilaterally. No Wheezing or Rhonchi. No rales. Heart: Regular rate and rhythm, no murmur, rub or gallop. Abdomen: abdominal wall wounds have healed predominantly except for one       02/22/19      Extremities:edema legs Skin: No rashes or lesions. Or bruising Lymph: Cervical, supraclavicular normal. Neurologic: Grossly non-focal Pertinent Labs Lab Results CBC    Component Value Date/Time   WBC 12.3 (H) 05/29/2019 1612   RBC 3.92 05/29/2019 1612   HGB 8.6 (L) 05/29/2019 1612   HGB 12.1 12/13/2013 1628   HCT 28.6 (L) 05/29/2019 1612   HCT 38.4 12/13/2013 1628   PLT 389  05/29/2019 1612   PLT 364 12/13/2013 1628   MCV 73.0 (L) 05/29/2019 1612   MCV 82 12/13/2013 1628   MCH 21.9 (L) 05/29/2019 1612   MCHC 30.1 05/29/2019 1612   RDW 24.8 (H) 05/29/2019 1612   RDW 16.9 (H) 12/13/2013 1628   LYMPHSABS 2.1 05/29/2019 1612   LYMPHSABS 3.2 04/10/2013 1836   MONOABS 0.8 05/29/2019 1612   MONOABS 0.7 04/10/2013 1836   EOSABS 0.3 05/29/2019 1612  EOSABS 0.3 04/10/2013 1836   BASOSABS 0.1 05/29/2019 1612   BASOSABS 0.1 04/10/2013 1836    CMP Latest Ref Rng & Units 05/29/2019 05/22/2019 05/22/2019  Glucose 70 - 99 mg/dL 119(H) 91 94  BUN 6 - 20 mg/dL 22(H) 29(H) 30(H)  Creatinine 0.44 - 1.00 mg/dL 1.05(H) 1.19(H) 1.22(H)  Sodium 135 - 145 mmol/L 139 137 137  Potassium 3.5 - 5.1 mmol/L 4.7 5.2(H) 5.2(H)  Chloride 98 - 111 mmol/L 110 107 107  CO2 22 - 32 mmol/L 21(L) 19(L) 19(L)  Calcium 8.9 - 10.3 mg/dL 6.9(L) 7.2(L) 7.3(L)  Total Protein 6.5 - 8.1 g/dL 6.0(L) 6.0(L) -  Total Bilirubin 0.3 - 1.2 mg/dL 0.9 0.6 -  Alkaline Phos 38 - 126 U/L 190(H) 176(H) -  AST 15 - 41 U/L 169(H) 192(H) -  ALT 0 - 44 U/L 86(H) 97(H) -     ? Impression/Recommendation ? 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  Has completed 3 months of antibiotics .  Was predominantly on  3 drug regimen but but with short barriers of 2 drug regimens.  She is currently on IV imipenem, IV tigecycline and clofazimine. She had taken amikacin for nearly 3 weeks and it had to be stopped because of creatinine inching up.  Almost all the wounds have healed.  We will continue the current regimen for another 3 weeks. She was unable to procure oral antibiotics like omadacycline on today's alert because of high co-pay  Malnutrition with hypo albuminemia, hypocalcemia, hypomagnesemia has been corrected.  Iron deficiency anemia.  Received 1 unit of PRBC when she was at [facility] and is also received IV iron Fatigue and excess thickness much improved.  Right hip arthritis.  Planning for  surgery sometime in the future.  Left total hip arthroplasty.  Gastric bypass surgery  leading to weight loss necessitating panniculectomy. Complicated by Mycobacterium abscessus infection  Hypertension on metoprolol and lisinopril and spironolactone  Diabetes mellitus on metformin  Depression on multiple SSRIs.  Follow-up 2 months ___________________________________________________ Discussed the management with the patient in detail and answered all her questions.   Note:  This document was prepared using Dragon voice recognition software and may include unintentional dictation errors.

## 2019-06-19 NOTE — Patient Instructions (Signed)
You are here for follow up for ATM infection of the abdominal wall- you are on clofazamine, imipenem and tigecyline- will complete the treatment at the end of this month- 4 month treatment- will then remove PICC. Will follow up in 2 months

## 2019-06-20 ENCOUNTER — Ambulatory Visit: Payer: Medicare HMO

## 2019-06-21 ENCOUNTER — Telehealth: Payer: Self-pay | Admitting: Licensed Clinical Social Worker

## 2019-06-22 ENCOUNTER — Other Ambulatory Visit: Payer: Self-pay | Admitting: Internal Medicine

## 2019-06-22 DIAGNOSIS — N179 Acute kidney failure, unspecified: Secondary | ICD-10-CM | POA: Diagnosis not present

## 2019-06-22 DIAGNOSIS — A318 Other mycobacterial infections: Secondary | ICD-10-CM | POA: Diagnosis not present

## 2019-06-22 DIAGNOSIS — R627 Adult failure to thrive: Secondary | ICD-10-CM | POA: Diagnosis not present

## 2019-06-22 DIAGNOSIS — M793 Panniculitis, unspecified: Secondary | ICD-10-CM | POA: Diagnosis not present

## 2019-06-22 DIAGNOSIS — E43 Unspecified severe protein-calorie malnutrition: Secondary | ICD-10-CM | POA: Diagnosis not present

## 2019-06-22 DIAGNOSIS — Z1231 Encounter for screening mammogram for malignant neoplasm of breast: Secondary | ICD-10-CM

## 2019-06-22 DIAGNOSIS — T8142XA Infection following a procedure, deep incisional surgical site, initial encounter: Secondary | ICD-10-CM | POA: Diagnosis not present

## 2019-06-22 NOTE — Telephone Encounter (Signed)
error 

## 2019-06-25 ENCOUNTER — Other Ambulatory Visit: Payer: Self-pay | Admitting: Licensed Clinical Social Worker

## 2019-06-25 ENCOUNTER — Telehealth: Payer: Self-pay | Admitting: Licensed Clinical Social Worker

## 2019-06-25 DIAGNOSIS — N179 Acute kidney failure, unspecified: Secondary | ICD-10-CM | POA: Diagnosis not present

## 2019-06-25 DIAGNOSIS — R627 Adult failure to thrive: Secondary | ICD-10-CM | POA: Diagnosis not present

## 2019-06-25 DIAGNOSIS — A318 Other mycobacterial infections: Secondary | ICD-10-CM | POA: Diagnosis not present

## 2019-06-25 DIAGNOSIS — E43 Unspecified severe protein-calorie malnutrition: Secondary | ICD-10-CM | POA: Diagnosis not present

## 2019-06-25 DIAGNOSIS — M793 Panniculitis, unspecified: Secondary | ICD-10-CM | POA: Diagnosis not present

## 2019-06-25 DIAGNOSIS — R7881 Bacteremia: Secondary | ICD-10-CM | POA: Diagnosis not present

## 2019-06-25 DIAGNOSIS — T8142XA Infection following a procedure, deep incisional surgical site, initial encounter: Secondary | ICD-10-CM | POA: Diagnosis not present

## 2019-06-25 MED ORDER — FLUCONAZOLE 100 MG PO TABS: 100.0000 mg | ORAL_TABLET | Freq: Every day | ORAL | 0 refills | Status: DC

## 2019-06-25 NOTE — Telephone Encounter (Signed)
Patient called stating that she has a bad yeast infection that started on Saturday. She 150 mg of diflucan and it did not help. Wanting a prescription called in for more if possible.

## 2019-06-25 NOTE — Telephone Encounter (Signed)
Can send Fluconazole 100mg  once a day for 5 days. Thx

## 2019-06-26 ENCOUNTER — Other Ambulatory Visit: Payer: Self-pay | Admitting: Infectious Diseases

## 2019-06-26 DIAGNOSIS — M793 Panniculitis, unspecified: Secondary | ICD-10-CM | POA: Diagnosis not present

## 2019-06-26 DIAGNOSIS — E43 Unspecified severe protein-calorie malnutrition: Secondary | ICD-10-CM | POA: Diagnosis not present

## 2019-06-26 DIAGNOSIS — A318 Other mycobacterial infections: Secondary | ICD-10-CM | POA: Diagnosis not present

## 2019-06-26 DIAGNOSIS — R627 Adult failure to thrive: Secondary | ICD-10-CM | POA: Diagnosis not present

## 2019-06-26 DIAGNOSIS — T8142XA Infection following a procedure, deep incisional surgical site, initial encounter: Secondary | ICD-10-CM | POA: Diagnosis not present

## 2019-06-26 DIAGNOSIS — N179 Acute kidney failure, unspecified: Secondary | ICD-10-CM | POA: Diagnosis not present

## 2019-06-27 DIAGNOSIS — R627 Adult failure to thrive: Secondary | ICD-10-CM | POA: Diagnosis not present

## 2019-06-27 DIAGNOSIS — N179 Acute kidney failure, unspecified: Secondary | ICD-10-CM | POA: Diagnosis not present

## 2019-06-27 DIAGNOSIS — A318 Other mycobacterial infections: Secondary | ICD-10-CM | POA: Diagnosis not present

## 2019-06-27 DIAGNOSIS — E43 Unspecified severe protein-calorie malnutrition: Secondary | ICD-10-CM | POA: Diagnosis not present

## 2019-06-27 DIAGNOSIS — T8142XA Infection following a procedure, deep incisional surgical site, initial encounter: Secondary | ICD-10-CM | POA: Diagnosis not present

## 2019-06-27 DIAGNOSIS — M793 Panniculitis, unspecified: Secondary | ICD-10-CM | POA: Diagnosis not present

## 2019-06-28 DIAGNOSIS — S92912A Unspecified fracture of left toe(s), initial encounter for closed fracture: Secondary | ICD-10-CM | POA: Diagnosis not present

## 2019-06-28 DIAGNOSIS — Z9884 Bariatric surgery status: Secondary | ICD-10-CM | POA: Diagnosis not present

## 2019-06-28 DIAGNOSIS — M7989 Other specified soft tissue disorders: Secondary | ICD-10-CM | POA: Diagnosis not present

## 2019-06-28 DIAGNOSIS — M79672 Pain in left foot: Secondary | ICD-10-CM | POA: Diagnosis not present

## 2019-06-28 DIAGNOSIS — Z6841 Body Mass Index (BMI) 40.0 and over, adult: Secondary | ICD-10-CM | POA: Diagnosis not present

## 2019-06-29 ENCOUNTER — Telehealth: Payer: Self-pay | Admitting: Licensed Clinical Social Worker

## 2019-06-29 NOTE — Telephone Encounter (Signed)
Spoke to her and asked her to hold all antibiotics and observe- labs on monday

## 2019-06-29 NOTE — Telephone Encounter (Signed)
I called the patient to let her know that her LFT's increased some and that Dr. Delaine Lame will watch the labs drawn on this coming up Monday. Patient states that she is feeling a little more tired and run down. Patient also states that Dr. Doy Hutching increased her magnesium and gave her a few more days of diflucan.

## 2019-07-02 ENCOUNTER — Other Ambulatory Visit: Payer: Self-pay | Admitting: Infectious Diseases

## 2019-07-02 DIAGNOSIS — T8142XA Infection following a procedure, deep incisional surgical site, initial encounter: Secondary | ICD-10-CM | POA: Diagnosis not present

## 2019-07-02 DIAGNOSIS — A318 Other mycobacterial infections: Secondary | ICD-10-CM | POA: Diagnosis not present

## 2019-07-02 DIAGNOSIS — E43 Unspecified severe protein-calorie malnutrition: Secondary | ICD-10-CM | POA: Diagnosis not present

## 2019-07-02 DIAGNOSIS — M793 Panniculitis, unspecified: Secondary | ICD-10-CM | POA: Diagnosis not present

## 2019-07-02 DIAGNOSIS — R7881 Bacteremia: Secondary | ICD-10-CM | POA: Diagnosis not present

## 2019-07-02 DIAGNOSIS — N179 Acute kidney failure, unspecified: Secondary | ICD-10-CM | POA: Diagnosis not present

## 2019-07-02 DIAGNOSIS — R627 Adult failure to thrive: Secondary | ICD-10-CM | POA: Diagnosis not present

## 2019-07-02 DIAGNOSIS — A319 Mycobacterial infection, unspecified: Secondary | ICD-10-CM | POA: Diagnosis not present

## 2019-07-02 NOTE — Telephone Encounter (Signed)
Yes I let the patient know, and she said she will inform Dr. Clemon Chambers

## 2019-07-02 NOTE — Telephone Encounter (Signed)
Patient called this morning stating that she was taking a shower and noticed white exudate from her wound. When she wiped it off she noticed that there was a hole that has reappeared. The nurse was out this morning and noticed it as well, and was able to put the tip of a sterile q-tip inside of it. RN did not try to push it in the wound, but stated that it could fit inside of it if she had persisted. Patient wanted to Dr. Delaine Lame to be aware that the hole is there.

## 2019-07-02 NOTE — Telephone Encounter (Signed)
Can you ask her to inform Dr.Pilati as well? Thx. I asked her to hold off on all her antibiotics over the weekend- She will not continue. I am going to see whet her labs are doing now

## 2019-07-03 DIAGNOSIS — E43 Unspecified severe protein-calorie malnutrition: Secondary | ICD-10-CM | POA: Diagnosis not present

## 2019-07-03 DIAGNOSIS — T8142XA Infection following a procedure, deep incisional surgical site, initial encounter: Secondary | ICD-10-CM | POA: Diagnosis not present

## 2019-07-03 DIAGNOSIS — N179 Acute kidney failure, unspecified: Secondary | ICD-10-CM | POA: Diagnosis not present

## 2019-07-03 DIAGNOSIS — M793 Panniculitis, unspecified: Secondary | ICD-10-CM | POA: Diagnosis not present

## 2019-07-03 DIAGNOSIS — R627 Adult failure to thrive: Secondary | ICD-10-CM | POA: Diagnosis not present

## 2019-07-03 DIAGNOSIS — A318 Other mycobacterial infections: Secondary | ICD-10-CM | POA: Diagnosis not present

## 2019-07-05 ENCOUNTER — Encounter: Payer: Self-pay | Admitting: Emergency Medicine

## 2019-07-05 ENCOUNTER — Emergency Department
Admission: EM | Admit: 2019-07-05 | Discharge: 2019-07-05 | Disposition: A | Discharge status: Home / Self Care | Payer: Medicare HMO | Attending: Emergency Medicine | Admitting: Emergency Medicine

## 2019-07-05 ENCOUNTER — Other Ambulatory Visit: Payer: Self-pay

## 2019-07-05 ENCOUNTER — Other Ambulatory Visit: Payer: Self-pay | Admitting: Infectious Diseases

## 2019-07-05 DIAGNOSIS — J45909 Unspecified asthma, uncomplicated: Secondary | ICD-10-CM | POA: Insufficient documentation

## 2019-07-05 DIAGNOSIS — I10 Essential (primary) hypertension: Secondary | ICD-10-CM | POA: Insufficient documentation

## 2019-07-05 DIAGNOSIS — R531 Weakness: Secondary | ICD-10-CM | POA: Diagnosis not present

## 2019-07-05 DIAGNOSIS — M62838 Other muscle spasm: Secondary | ICD-10-CM | POA: Diagnosis present

## 2019-07-05 DIAGNOSIS — E890 Postprocedural hypothyroidism: Secondary | ICD-10-CM | POA: Insufficient documentation

## 2019-07-05 DIAGNOSIS — M6281 Muscle weakness (generalized): Secondary | ICD-10-CM | POA: Diagnosis not present

## 2019-07-05 DIAGNOSIS — Z79899 Other long term (current) drug therapy: Secondary | ICD-10-CM | POA: Diagnosis not present

## 2019-07-05 LAB — BASIC METABOLIC PANEL
Anion gap: 11 (ref 5–15)
BUN: 18 mg/dL (ref 6–20)
CO2: 22 mmol/L (ref 22–32)
Calcium: 8.5 mg/dL — ABNORMAL LOW (ref 8.9–10.3)
Chloride: 106 mmol/L (ref 98–111)
Creatinine, Ser: 0.92 mg/dL (ref 0.44–1.00)
GFR calc Af Amer: >60 mL/min (ref >60)
GFR calc non Af Amer: >60 mL/min (ref >60)
Glucose, Bld: 112 mg/dL — ABNORMAL HIGH (ref 70–99)
Potassium: 4.8 mmol/L (ref 3.5–5.1)
Sodium: 139 mmol/L (ref 135–145)

## 2019-07-05 LAB — URINALYSIS, COMPLETE (UACMP) WITH MICROSCOPIC
Bacteria, UA: NONE SEEN
Bilirubin Urine: NEGATIVE
Glucose, UA: NEGATIVE mg/dL
Hgb urine dipstick: NEGATIVE
Ketones, ur: NEGATIVE mg/dL
Leukocytes,Ua: NEGATIVE
Nitrite: NEGATIVE
Protein, ur: NEGATIVE mg/dL
Specific Gravity, Urine: 1.009 (ref 1.005–1.030)
pH: 7 (ref 5.0–8.0)

## 2019-07-05 LAB — CBC
HCT: 34 % — ABNORMAL LOW (ref 36.0–46.0)
Hemoglobin: 10.7 g/dL — ABNORMAL LOW (ref 12.0–15.0)
MCH: 27.1 pg (ref 26.0–34.0)
MCHC: 31.5 g/dL (ref 30.0–36.0)
MCV: 86.1 fL (ref 80.0–100.0)
Platelets: 326 10*3/uL (ref 150–400)
RBC: 3.95 MIL/uL (ref 3.87–5.11)
RDW: 20.7 % — ABNORMAL HIGH (ref 11.5–15.5)
WBC: 9.9 10*3/uL (ref 4.0–10.5)
nRBC: 0 % (ref 0.0–0.2)

## 2019-07-05 LAB — HEPATIC FUNCTION PANEL
ALT: 140 U/L — ABNORMAL HIGH (ref 0–44)
AST: 198 U/L — ABNORMAL HIGH (ref 15–41)
Albumin: 3.5 g/dL (ref 3.5–5.0)
Alkaline Phosphatase: 117 U/L (ref 38–126)
Bilirubin, Direct: 0.1 mg/dL (ref 0.0–0.2)
Indirect Bilirubin: 0.5 mg/dL (ref 0.3–0.9)
Total Bilirubin: 0.6 mg/dL (ref 0.3–1.2)
Total Protein: 6.8 g/dL (ref 6.5–8.1)

## 2019-07-05 LAB — POCT PREGNANCY, URINE: Preg Test, Ur: NEGATIVE

## 2019-07-05 LAB — MAGNESIUM: Magnesium: 1.6 mg/dL — ABNORMAL LOW (ref 1.7–2.4)

## 2019-07-05 MED ORDER — MAGNESIUM SULFATE 2 GM/50ML IV SOLN
2.0000 g | Freq: Once | INTRAVENOUS | Status: AC
  Administered 2019-07-05: 20:00:00 2 g via INTRAVENOUS
  Filled 2019-07-05 (×2): qty 50

## 2019-07-05 NOTE — ED Provider Notes (Signed)
[facility] Emergency Department Provider Note   ____________________________________________   First MD Initiated Contact with Patient 07/05/19 1734     (approximate)  I have reviewed the triage vital signs and the nursing notes.   HISTORY  Chief Complaint Spasms and Weakness    HPI Karen Lewis is a 48 y.o. female who has a recent medical history including chronic infection which is now felt to be resolved involving left flank previous incision.  Also history of arthritis, gastritis, gout, diabetes, and multiple medical problems  Patient reports that she began feeling fatigued.  She is felt similar and reports she came here once, was then admitted to [facility] for low calcium levels.  Also low magnesium.  She is followed closely with Dr. Steva Ready from infectious disease and is now off antibiotics as her wound seems to be healing over.  No fevers or chills.  No nausea or vomiting.  Reports she just feels fatigued she has had a little bit of occasional muscle spasming feeling but denies any tremors or clamping down of the muscle  She has been taking calcium supplements regularly now and is not any pain or burning with urination.  Previous history of Mycobacterium infection and abscess which is improving   Past Medical History:  Diagnosis Date   Anemia    Anginal pain ([place])    Anxiety    Arthralgia of hip 07/29/2015   Arthritis    Arthritis, degenerative 07/29/2015   Asthma    Cephalalgia 07/25/2014   Dependence on unknown drug ([place])    multiplt controlled drug dependence   Depression    Diabetes mellitus without complication ([place])    Dysrhythmia    Eczema    Fibromyalgia    Gastritis    GERD (gastroesophageal reflux disease)    Gonalgia 07/29/2015   Overview:  Overview:  The patient has had bilateral intra-articular Hyalgan injections done on 07/16/2014 and although she seems to do well with this type of therapy, apparently  her insurance company does not want to pay for they Hyalgan. On 11/27/2014 the patient underwent a bilateral genicular nerve block with excellent results. On 01/28/2015 she had a right knee genicular radiofrequency ablatio   Gout    H/O cardiovascular disorder 03/10/2015   H/O surgical procedure 12/05/2012   Overview:  LSG ([facility] - April 2013)     H/O thyroid disease 03/10/2015   Headache    Herpes    History of artificial joint 07/29/2015   History of hiatal hernia    Hypertension    Hypomagnesemia    Hypothyroidism    IDA (iron deficiency anemia) 05/28/2019   LBP (low back pain) 07/29/2015   Neuromuscular disorder ([place])    Obesity    PCOS (polycystic ovarian syndrome)    Primary osteoarthritis of both knees 07/29/2015   Sleep apnea    Thyroid nodule bilateral    Patient Active Problem List   Diagnosis Date Noted   IDA (iron deficiency anemia) 05/28/2019   Mycobacterium abscessus infection 02/22/2019   Surgical wound, non healing 09/27/2018   Wound infection after surgery 08/11/2018   Panniculitis 07/05/2018   Allergy to iodine 04/26/2018   H/O allergy to shellfish 04/26/2018   Encounter for dental examination 02/21/2018   Spondylosis without myelopathy or radiculopathy, lumbosacral region 02/21/2018   History of gastric bypass 02/15/2018   Depression 01/19/2018   Secondary Osteoarthritis of knee (Bilateral) (R>L) 01/02/2018   Encounter for dental exam and cleaning w/o abnormal findings 12/19/2017  Post op infection 12/05/2017   Osteoarthritis of hip (Right) 07/07/2017   Osteoarthritis of shoulder (Left) 05/31/2017   Upper extremity pain 04/21/2017   Chronic shoulder pain (Left) 04/21/2017   Chronic pain syndrome 12/02/2016   Neurogenic pain 12/02/2016   Chest pain with low risk of acute coronary syndrome 08/04/2016   SOB (shortness of breath) on exertion 08/04/2016   Dysphagia, unspecified 05/04/2016   Gastroesophageal reflux  disease without esophagitis 05/04/2016   Vitamin D insufficiency 11/27/2015   Nontoxic goiter 11/12/2015   Other psychoactive substance dependence, uncomplicated ([place]) [id number]   Controlled drug dependence ([place]) 10/13/2015   Lumbar spondylosis 09/18/2015   Hypomagnesemia 08/27/2015   Morbid obesity with body mass index (BMI) greater than or equal to 70 in adult [facility]) 08/27/2015   Long term current use of opiate analgesic 07/29/2015   Long term prescription opiate use 07/29/2015   Opiate use 07/29/2015   Encounter for therapeutic drug level monitoring 07/29/2015   Opiate dependence ([place]) 07/29/2015   Chronic knee pain (Primary Area of Pain) (Bilateral) (R>L) 07/29/2015   Chronic low back pain (Third area of Pain) (Bilateral) (R>L) 07/29/2015   Lumbar facet syndrome (Bilateral) (R>L) 07/29/2015   Osteoarthritis 07/29/2015   Grade 1 (1.4 cm) Anterolisthesis of L4 over L5 07/29/2015   Chronic hip pain (Secondary area of Pain) (Right) 07/29/2015   S/P THR (total hip replacement) (Left) 07/29/2015   History of methicillin resistant staphylococcus aureus (MRSA) 07/29/2015   History of bariatric surgery 07/29/2015   History of methicillin resistant Staphylococcus aureus infection 07/29/2015   Eczema 05/21/2015   Gout 05/21/2015   Headache, migraine 05/21/2015   Bilateral polycystic ovarian syndrome 05/21/2015   Disease of thyroid gland 05/21/2015   Major depressive disorder, single episode 05/21/2015   Dermatitis 05/21/2015   Obstructive apnea 04/30/2015   History of asthma 03/10/2015   Binge eating disorder 03/10/2015   Fibromyalgia 03/10/2015   Anxiety, generalized 03/10/2015   Insomnia, persistent 03/10/2015   Depression, major, recurrent, moderate ([place]) 03/10/2015   Avitaminosis D 04/30/2014   Hyperlipidemia 04/25/2014   Hyperlipidemia due to type 2 diabetes mellitus ([place]) [id number]   Uncomplicated asthma [id number]   Asthma 03/19/2014     Goiter, nontoxic, multinodular 10/09/2013   Apnea, sleep 12/05/2012   History of surgical procedure 12/05/2012   Type 2 diabetes mellitus without complication, without long-term current use of insulin ([place]) 12/05/2012   Hypertension associated with diabetes ([place]) 12/05/2012    Past Surgical History:  Procedure Laterality Date   CARPAL TUNNEL RELEASE Bilateral    CHOLECYSTECTOMY     DIAGNOSTIC LAPAROSCOPY     HIATAL HERNIA REPAIR     JOINT REPLACEMENT Left hip   LAPAROSCOPIC PARTIAL GASTRECTOMY     left trigger finger     peniculectomy N/A 07/05/2018   ROUX-EN-Y GASTRIC BYPASS  06/03/2017   SHOULDER ARTHROSCOPY Right    THYROIDECTOMY N/A 11/12/2015   Procedure: THYROIDECTOMY;  Surgeon: Clyde Canterbury, MD;  Location: [facility] ORS;  Service: ENT;  Laterality: N/A;   TRIGGER FINGER RELEASE Right     Prior to Admission medications   Medication Sig Start Date End Date Taking? Authorizing Provider  ACCU-CHEK AVIVA PLUS test strip  03/13/19   [provider]  acyclovir (ZOVIRAX) 800 MG tablet Take 800 mg by mouth as needed. Reported on 11/12/2015    [provider]  albuterol (PROVENTIL HFA;VENTOLIN HFA) 108 (90 BASE) MCG/ACT inhaler Inhale 2 puffs into the lungs every 6 (six) hours as needed for wheezing or shortness  of breath.    [provider]  amitriptyline (ELAVIL) 100 MG tablet  01/12/18   [provider]  Biotin 10 MG CAPS     [provider]  calcium carbonate (TUMS EX) 750 MG chewable tablet Chew 3 tablets by mouth 3 (three) times daily. 06/07/19 07/07/19  [provider]  clotrimazole (MYCELEX) 10 MG troche Take 10 mg by mouth as needed. 06/29/18   [provider]  diclofenac sodium (VOLTAREN) 1 % GEL Apply 2 g topically 4 (four) times daily. 05/29/19   Noemi Chapel, MD  diphenhydrAMINE (BENADRYL) 25 mg capsule Take 25 mg by mouth as needed.    [provider]  Emollient (A + D PERSONAL CARE LOTION EX)  Apply 1 application topically as needed. 05/23/18   [provider]  EPINEPHrine 0.3 mg/0.3 mL IJ SOAJ injection Inject 0.3 mg into the skin as needed.    [provider]  fluconazole (DIFLUCAN) 100 MG tablet Take 1 tablet (100 mg total) by mouth daily. 06/25/19   Tsosie Billing, MD  imipenem-cilastatin (PRIMAXIN) 500 MG injection Inject 1,000 mg into the vein every 12 (twelve) hours. 03/13/19   Tsosie Billing, MD  ketoconazole (NIZORAL) 2 % cream APPLY AS DIRECTED TO AFFECTED AREA TWICE DAILY AS NEEDED 12/17/14   [provider]  levothyroxine (SYNTHROID) 125 MCG tablet Take 250 mcg by mouth.  08/31/18 08/31/19  [provider]  lisinopril (ZESTRIL) 5 MG tablet Take by mouth. 12/11/18 12/11/19  [provider]  MAGNESIUM GLYCINATE PO Take 200 mg by mouth 4 (four) times daily. 06/07/19 07/07/19  [provider]  magnesium oxide (MAG-OX) 400 MG tablet Take 1 tablet (400 mg total) by mouth 2 (two) times daily. 06/09/19 09/07/19  Milinda Pointer, MD  metFORMIN (GLUCOPHAGE) 1000 MG tablet Take 1,000 mg by mouth 2 (two) times daily with a meal.     [provider]  metoprolol tartrate (LOPRESSOR) 50 MG tablet Take 50 mg by mouth 2 (two) times daily.  03/16/19   [provider]  montelukast (SINGULAIR) 10 MG tablet Take 10 mg by mouth at bedtime.     [provider]  Multiple Vitamin (MULTI-VITAMIN) tablet Take 2 tablets by mouth daily. 06/08/19 06/07/20  [provider]  naloxone [facility]) nasal spray 4 mg/0.1 mL Spray into one nostril. Repeat with second device into other nostril after 2-3 minutes if no or minimal response. 02/08/19   Milinda Pointer, MD  nystatin (MYCOSTATIN) 100000 UNIT/ML suspension Swish and swallow 2 mLs 4 (four) times daily 02/06/19   [provider]  omeprazole (PRILOSEC) 40 MG capsule Take 40 mg by mouth 2 (two) times daily.     [provider]  ondansetron  (ZOFRAN-ODT) 4 MG disintegrating tablet Take 4 mg by mouth as needed. 07/11/18   [provider]  oxyCODONE (OXY IR/ROXICODONE) 5 MG immediate release tablet Take 1 tablet (5 mg total) by mouth 5 (five) times daily. Must last 30 days 06/09/19 07/09/19  Milinda Pointer, MD  oxyCODONE (OXY IR/ROXICODONE) 5 MG immediate release tablet Take 1 tablet (5 mg total) by mouth 5 (five) times daily. Must last 30 days 07/09/19 08/08/19  Milinda Pointer, MD  oxyCODONE (OXY IR/ROXICODONE) 5 MG immediate release tablet Take 1 tablet (5 mg total) by mouth 5 (five) times daily. Must last 30 days 08/08/19 09/07/19  Milinda Pointer, MD  pregabalin (LYRICA) 225 MG capsule Take 1 capsule (225 mg total) by mouth 2 (two) times daily. 06/09/19 09/07/19  Milinda Pointer,  MD  QUEtiapine (SEROQUEL) 100 MG tablet Take 100 mg by mouth at bedtime.    [provider]  rosuvastatin (CRESTOR) 40 MG tablet  02/28/19   [provider]  sertraline (ZOLOFT) 100 MG tablet Take 200 mg by mouth daily.  03/13/19   [provider]  spironolactone (ALDACTONE) 25 MG tablet Take 37.5 mg by mouth 2 (two) times daily.     [provider]  tigecycline (TYGACIL) 50 MG injection Inject 50 mg into the vein every 12 (twelve) hours.    [provider]  tiZANidine (ZANAFLEX) 4 MG tablet Take by mouth. 02/28/19   [provider]  triamcinolone (NASACORT AQ) 55 MCG/ACT AERO nasal inhaler 2 sprays by Nasal route 2 (two) times daily.    [provider]  TYGACIL 50 MG injection  04/17/19   [provider]  vitamin C (ASCORBIC ACID) 500 MG tablet Take 500 mg by mouth daily.    [provider]  Vitamin D, Ergocalciferol, (DRISDOL) 1.25 MG (50000 UT) CAPS capsule  03/29/19   [provider]  zolpidem (AMBIEN) 10 MG tablet Take 10 mg by mouth at bedtime as needed for sleep.    [provider]  zonisamide (ZONEGRAN) 50 MG capsule Take 150 mg by mouth  daily. 3 tabs at bedtime    [provider]    Allergies Bactrim [sulfamethoxazole-trimethoprim], Omalizumab, Ciprofloxacin, Shellfish allergy, Aspirin, Clindamycin, Motrin [ibuprofen], Nsaids, Other, and Sulfa antibiotics  Family History  Problem Relation Age of Onset   Anxiety disorder Mother    Depression Mother    Alcohol abuse Mother    Diabetes Mother    Hypertension Mother    Kidney cancer Mother    Sleep apnea Mother    Alcohol abuse Father    Anxiety disorder Father    Depression Father    Post-traumatic stress disorder Father    Kidney failure Father    COPD Father    Diabetes Father    Hypertension Father    Sleep apnea Father    Depression Brother    Diabetes Brother    Hypertension Brother    Sleep apnea Brother    Breast cancer Paternal Aunt    Bladder Cancer Neg Hx    Prostate cancer Neg Hx     Social History Social History   Tobacco Use   Smoking status: Never Smoker   Smokeless tobacco: Never Used  Substance Use Topics   Alcohol use: No    Alcohol/week: 0.0 standard drinks   Drug use: No    Review of Systems Constitutional: No fever/chills but feeling of generalized weakness Eyes: No visual changes. ENT: No sore throat. Cardiovascular: Denies chest pain. Respiratory: Denies shortness of breath. Gastrointestinal: No abdominal pain.   Genitourinary: Negative for dysuria. Musculoskeletal: Negative for back pain. Skin: Negative for rash except for her healing lesion over the left flank which she shows me, reports it is improving but very slow to close. Neurological: Negative for headaches, areas of focal weakness or numbness.  She reports a weak feeling in general feeling, but she denies that like 1 extremity or lower extremities feel more weak than anywhere else.  She is not had a paralysis    ____________________________________________   PHYSICAL EXAM:  VITAL SIGNS: ED Triage Vitals  Enc Vitals Group      BP 07/05/19 1556 121/83     Pulse Rate 07/05/19 1556 86     Resp 07/05/19 1556 18     Temp 07/05/19 1556  99.8 F (37.7 C)     Temp Source 07/05/19 1556 Oral     SpO2 07/05/19 1556 97 %     Weight 07/05/19 1559 (!) 350 lb (158.8 kg)     Height 07/05/19 1559 5\' 3"  (1.6 m)     Head Circumference --      Peak Flow --      Pain Score 07/05/19 1559 5     Pain Loc --      Pain Edu? --      Excl. in [place]? --     Constitutional: Alert and oriented. Well appearing and in no acute distress.  Somewhat chronically ill in appearance but no acute distress. Eyes: Conjunctivae are normal. Head: Atraumatic. Nose: No congestion/rhinnorhea. Mouth/Throat: Mucous membranes are moist. Neck: No stridor.  Cardiovascular: Normal rate, regular rhythm. Grossly normal heart sounds.  Good peripheral circulation. Respiratory: Normal respiratory effort.  No retractions. Lungs CTAB. Gastrointestinal: Soft and nontender. No distention. Musculoskeletal: No lower extremity tenderness nor edema.  5 out of 5 muscle strength in all groups.  Able to sit up well on her own without difficulty. Neurologic:  Normal speech and language. No gross focal neurologic deficits are appreciated.  Skin:  Skin is warm, dry and intact except for a tiny area of nonunion of her left surgical wound the majority is clean dry intact there is no surrounding erythema or purulent drainage and she denies tenderness in the area reports it seems to be improving slowly. No rash noted. Psychiatric: Mood and affect are normal. Speech and behavior are normal.  ____________________________________________   LABS (all labs ordered are listed, but only abnormal results are displayed)  Labs Reviewed  BASIC METABOLIC PANEL - Abnormal; Notable for the following components:      Result Value   Glucose, Bld 112 (*)    Calcium 8.5 (*)    All other components within normal limits  CBC - Abnormal; Notable for the following components:   Hemoglobin  10.7 (*)    HCT 34.0 (*)    RDW 20.7 (*)    All other components within normal limits  URINALYSIS, COMPLETE (UACMP) WITH MICROSCOPIC - Abnormal; Notable for the following components:   Color, Urine YELLOW (*)    APPearance CLEAR (*)    All other components within normal limits  MAGNESIUM - Abnormal; Notable for the following components:   Magnesium 1.6 (*)    All other components within normal limits  HEPATIC FUNCTION PANEL - Abnormal; Notable for the following components:   AST 198 (*)    ALT 140 (*)    All other components within normal limits  POC URINE PREG, ED  POCT PREGNANCY, URINE   ____________________________________________  EKG  Reviewed entered by me at 1550 Heart rate 85 QRS 89 QTc 430 Normal sinus rhythm without evidence of ischemia ____________________________________________  RADIOLOGY  No results found.   ____________________________________________   PROCEDURES  Procedure(s) performed: None  Procedures  Critical Care performed: No  ____________________________________________   INITIAL IMPRESSION / ASSESSMENT AND PLAN / ED COURSE  Pertinent labs & imaging results that were available during my care of the patient were reviewed by me and considered in my medical decision making (see chart for details).   Generalized fatigue.  Previously attributed to electrolyte abnormalities, also seems to have a resolving or improving chronic infection now with what appears to be no evidence of infection by exam of the abdominal wound or clinical history or evaluation of lab work.  She does  appear slightly fatigued but in no acute distress.  Magnesium level noted to be low, will replete this.  Calcium level slightly low but better than in the past, and her albumin is normal.  Denies acute infectious cardiac or pulmonary symptoms.  Afebrile  Clinical Course as of Jul 04 2038  Thu Jul 05, 2019  1747 Discussed case with Dr. Delaine Lame at this time.   [MQ]  [id number]  Reviewed history, presentation, labs and history with Dr. Delaine Lame. Also, L hip wound clean, dray, intact, only a very small area of wound still healing and no purulence or tenderness. Small, small amount of clear serous fluid.    [MQ]  2018 Patient comfortable with plan for discharge after magnesium infusion completes.  She is currently alert, conversing with family on the phone, eating roast beef sandwich and drinking fluids.  She is comfortable with plan   [MQ]    Clinical Course User Index [MQ] Delman Kitten, MD    ----------------------------------------- 8:43 PM on 07/05/2019 -----------------------------------------  Patient feels well comfortable with plan for close follow-up.  Magnesium repleted and she does feel improved.  Suspect her symptoms likely secondary to hypomagnesemia, she has follow-up already and is following with endocrinology and infectious disease and primary care closely.  She appears appropriate for discharge and is comfortable with the plan ____________________________________________   FINAL CLINICAL IMPRESSION(S) / ED DIAGNOSES  Final diagnoses:  Hypomagnesemia  Generalized weakness        Note:  This document was prepared using Dragon voice recognition software and may include unintentional dictation errors       Delman Kitten, MD 07/05/19 2044

## 2019-07-05 NOTE — ED Triage Notes (Signed)
Patient states today she is having muscle spasms in her arms and legs.  Patient states she will be holding her telephone and then her arm starts shaking and she drops her phone.  Patient also reports weakness and feeling, "fuzzy headed", states, "I had to think to remember my phone number".  Patient is followed by infectious disease due to a mycobacterial abscess.

## 2019-07-05 NOTE — ED Notes (Signed)
Pt and friend provided with warm blankets at this time. Call bell within reach.

## 2019-07-05 NOTE — ED Notes (Signed)
Pt presents to ED via POV with c/o weakness, twitching to her hands, and feeling confused and slurred speech. Pt states "it sounds like I'm exhausted". Pt states she was just discharged from [facility] for low calcium and low magnesium due to taking too many ant-acids. Pt states feels like a "jolt" down her arm. Pt is alert and oriented. NSR on the monitor. Pt able to answer her phone without difficulty at this time while this RN in room.

## 2019-07-09 DIAGNOSIS — N179 Acute kidney failure, unspecified: Secondary | ICD-10-CM | POA: Diagnosis not present

## 2019-07-09 DIAGNOSIS — E43 Unspecified severe protein-calorie malnutrition: Secondary | ICD-10-CM | POA: Diagnosis not present

## 2019-07-09 DIAGNOSIS — R627 Adult failure to thrive: Secondary | ICD-10-CM | POA: Diagnosis not present

## 2019-07-09 DIAGNOSIS — T8142XA Infection following a procedure, deep incisional surgical site, initial encounter: Secondary | ICD-10-CM | POA: Diagnosis not present

## 2019-07-09 DIAGNOSIS — M793 Panniculitis, unspecified: Secondary | ICD-10-CM | POA: Diagnosis not present

## 2019-07-09 DIAGNOSIS — A318 Other mycobacterial infections: Secondary | ICD-10-CM | POA: Diagnosis not present

## 2019-07-10 ENCOUNTER — Telehealth: Payer: Self-pay | Admitting: Infectious Diseases

## 2019-07-10 DIAGNOSIS — A318 Other mycobacterial infections: Secondary | ICD-10-CM | POA: Diagnosis not present

## 2019-07-10 DIAGNOSIS — D631 Anemia in chronic kidney disease: Secondary | ICD-10-CM | POA: Diagnosis not present

## 2019-07-10 DIAGNOSIS — N179 Acute kidney failure, unspecified: Secondary | ICD-10-CM | POA: Diagnosis not present

## 2019-07-10 DIAGNOSIS — M793 Panniculitis, unspecified: Secondary | ICD-10-CM | POA: Diagnosis not present

## 2019-07-10 DIAGNOSIS — E43 Unspecified severe protein-calorie malnutrition: Secondary | ICD-10-CM | POA: Diagnosis not present

## 2019-07-10 DIAGNOSIS — T8142XA Infection following a procedure, deep incisional surgical site, initial encounter: Secondary | ICD-10-CM | POA: Diagnosis not present

## 2019-07-10 DIAGNOSIS — R627 Adult failure to thrive: Secondary | ICD-10-CM | POA: Diagnosis not present

## 2019-07-10 NOTE — Telephone Encounter (Signed)
Patient called to get clarification on labs that needed to be drawn   Zotter is the nurse  [phone]

## 2019-07-11 ENCOUNTER — Telehealth: Payer: Self-pay | Admitting: Infectious Diseases

## 2019-07-11 DIAGNOSIS — M793 Panniculitis, unspecified: Secondary | ICD-10-CM | POA: Diagnosis not present

## 2019-07-11 DIAGNOSIS — T8142XA Infection following a procedure, deep incisional surgical site, initial encounter: Secondary | ICD-10-CM | POA: Diagnosis not present

## 2019-07-11 DIAGNOSIS — N179 Acute kidney failure, unspecified: Secondary | ICD-10-CM | POA: Diagnosis not present

## 2019-07-11 DIAGNOSIS — R627 Adult failure to thrive: Secondary | ICD-10-CM | POA: Diagnosis not present

## 2019-07-11 DIAGNOSIS — E43 Unspecified severe protein-calorie malnutrition: Secondary | ICD-10-CM | POA: Diagnosis not present

## 2019-07-11 DIAGNOSIS — A318 Other mycobacterial infections: Secondary | ICD-10-CM | POA: Diagnosis not present

## 2019-07-11 NOTE — Telephone Encounter (Signed)
Pt called and left a message regarding her labs this morningand I called her back today. 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 She was on IV imipenem, IV tigecycline and PO clofazamine She had abnormal transaminases which are improving. Magnesium is still low at 1.3 I have asked her to contact her PCP regarding chronic hypomagnesemia  Abdominal wound has nearly healed- No further antibiotic needed. PICC can be removed Wyatt Haste Nurse [phone]) and spoke to Elvin So regarding the order to remove PICC

## 2019-07-13 ENCOUNTER — Other Ambulatory Visit: Payer: Self-pay

## 2019-07-15 DIAGNOSIS — M7989 Other specified soft tissue disorders: Secondary | ICD-10-CM | POA: Insufficient documentation

## 2019-07-15 NOTE — Progress Notes (Signed)
MRN : [id number]  Karen Lewis is a 48 y.o. (April 19, 1971) female who presents with chief complaint of No chief complaint on file. Marland Kitchen  History of Present Illness:   Patient is seen for evaluation of leg pain and leg swelling. The patient first noticed the swelling remotely. The swelling is associated with pain and discoloration. The pain and swelling worsens with prolonged dependency and improves with elevation. The pain is unrelated to activity.  The patient notes that in the morning the legs are significantly improved but they steadily worsened throughout the course of the day. The patient also notes her skin becomes like an orange peel and this extends all the way up the side to her hip on both sides but the right leg seems worse than the left.  The patient has a very complex history.  She has had a panniculectomy with several revisions secondary to abscess formation and infections.  She is been treated for both staph as well as mycobacterial infections.  Because of the swelling and the skin changes see she has had 2 ultrasounds both of which have been negative for DVT.  At one point she did have a filter placed.  She has also had CT scans that do not show any mass or pelvic abnormality.  She is hypoalbuminemic and anemic.  The patient denies symptoms consistent with rest pain.  The patient denies and extensive history of DJD and already has a left total hip and is undergoing evaluation for a right total hip replacement.  There is no specific diagnosis of LS spine disease.  The patient has no had any past angiography, interventions or vascular surgery.  Elevation makes the leg symptoms better, dependency makes them much worse. There is no history of ulcerations. The patient denies any recent changes in medications.  The patient has not been wearing graduated compression.  She has tried but they tend to roll down and cut into her calf.  The patient denies a history of DVT or PE. There is no prior  history of phlebitis.  No history of malignancies. No history of trauma or groin or pelvic surgery. There is no history of radiation treatment to the groin or pelvis  The patient denies amaurosis fugax or recent TIA symptoms. There are no recent neurological changes noted. The patient denies recent episodes of angina or shortness of breath  No outpatient medications have been marked as taking for the 07/16/19 encounter (Appointment) with Delana Meyer, Dolores Lory, MD.    Past Medical History:  Diagnosis Date  . Anemia   . Anginal pain ([place])   . Anxiety   . Arthralgia of hip 07/29/2015  . Arthritis   . Arthritis, degenerative 07/29/2015  . Asthma   . Cephalalgia 07/25/2014  . Dependence on unknown drug ([place])    multiplt controlled drug dependence  . Depression   . Diabetes mellitus without complication ([place])   . Dysrhythmia   . Eczema   . Fibromyalgia   . Gastritis   . GERD (gastroesophageal reflux disease)   . Gonalgia 07/29/2015   Overview:  Overview:  The patient has had bilateral intra-articular Hyalgan injections done on 07/16/2014 and although she seems to do well with this type of therapy, apparently her insurance company does not want to pay for they Hyalgan. On 11/27/2014 the patient underwent a bilateral genicular nerve block with excellent results. On 01/28/2015 she had a right knee genicular radiofrequency ablatio  . Gout   . H/O cardiovascular disorder 03/10/2015  .  H/O surgical procedure 12/05/2012   Overview:  LSG ([facility] - April 2013)    . H/O thyroid disease 03/10/2015  . Headache   . Herpes   . History of artificial joint 07/29/2015  . History of hiatal hernia   . Hypertension   . Hypomagnesemia   . Hypothyroidism   . IDA (iron deficiency anemia) 05/28/2019  . LBP (low back pain) 07/29/2015  . Neuromuscular disorder ([place])   . Obesity   . PCOS (polycystic ovarian syndrome)   . Primary osteoarthritis of both knees 07/29/2015  . Sleep apnea   . Thyroid nodule  bilateral    Past Surgical History:  Procedure Laterality Date  . CARPAL TUNNEL RELEASE Bilateral   . CHOLECYSTECTOMY    . DIAGNOSTIC LAPAROSCOPY    . HIATAL HERNIA REPAIR    . JOINT REPLACEMENT Left hip  . LAPAROSCOPIC PARTIAL GASTRECTOMY    . left trigger finger    . peniculectomy N/A 07/05/2018  . ROUX-EN-Y GASTRIC BYPASS  06/03/2017  . SHOULDER ARTHROSCOPY Right   . THYROIDECTOMY N/A 11/12/2015   Procedure: THYROIDECTOMY;  Surgeon: Clyde Canterbury, MD;  Location: [facility] ORS;  Service: ENT;  Laterality: N/A;  . TRIGGER FINGER RELEASE Right     Social History Social History   Tobacco Use  . Smoking status: Never Smoker  . Smokeless tobacco: Never Used  Substance Use Topics  . Alcohol use: No    Alcohol/week: 0.0 standard drinks  . Drug use: No    Family History Family History  Problem Relation Age of Onset  . Anxiety disorder Mother   . Depression Mother   . Alcohol abuse Mother   . Diabetes Mother   . Hypertension Mother   . Kidney cancer Mother   . Sleep apnea Mother   . Alcohol abuse Father   . Anxiety disorder Father   . Depression Father   . Post-traumatic stress disorder Father   . Kidney failure Father   . COPD Father   . Diabetes Father   . Hypertension Father   . Sleep apnea Father   . Depression Brother   . Diabetes Brother   . Hypertension Brother   . Sleep apnea Brother   . Breast cancer Paternal Aunt   . Bladder Cancer Neg Hx   . Prostate cancer Neg Hx   No family history of bleeding/clotting disorders, porphyria or autoimmune disease   Allergies  Allergen Reactions  . Bactrim [Sulfamethoxazole-Trimethoprim] Hives  . Omalizumab Itching and Hives  . Ciprofloxacin Other (See Comments)    Muscle Pain Possible myalgias. myalgia Possible myalgias. myalgia  . Shellfish Allergy Other (See Comments)    unknown  . Aspirin Other (See Comments)    GI UPSET  . Clindamycin Rash  . Motrin [Ibuprofen]     Other reaction(s): Other (See Comments)  GI UPSET  . Nsaids Nausea Only and Other (See Comments)    Stomach upset Other reaction(s): Other (See Comments) Stomach upset  . Other Hives  . Sulfa Antibiotics Hives     REVIEW OF SYSTEMS (Negative unless checked)  Constitutional: [] Weight loss  [] Fever  [] Chills Cardiac: [] Chest pain   [] Chest pressure   [] Palpitations   [] Shortness of breath when laying flat   [] Shortness of breath with exertion. Vascular:  [] Pain in legs with walking   [] Pain in legs at rest  [] History of DVT   [] Phlebitis   [x] Swelling in legs   [] Varicose veins   [] Non-healing ulcers Pulmonary:   [] Uses home oxygen   []   Productive cough   [] Hemoptysis   [] Wheeze  [] COPD   [] Asthma Neurologic:  [] Dizziness   [] Seizures   [] History of stroke   [] History of TIA  [] Aphasia   [] Vissual changes   [] Weakness or numbness in arm   [] Weakness or numbness in leg Musculoskeletal:   [x] Joint swelling   [x] Joint pain   [] Low back pain Hematologic:  [] Easy bruising  [] Easy bleeding   [] Hypercoagulable state   [] Anemic Gastrointestinal:  [] Diarrhea   [] Vomiting  [] Gastroesophageal reflux/heartburn   [] Difficulty swallowing. Genitourinary:  [] Chronic kidney disease   [] Difficult urination  [] Frequent urination   [] Blood in urine Skin:  [] Rashes   [] Ulcers  Psychological:  [] History of anxiety   []  History of major depression.  Physical Examination  There were no vitals filed for this visit. There is no height or weight on file to calculate BMI. Gen: WD/WN, NAD Head: [place]/AT, No temporalis wasting.  Ear/Nose/Throat: Hearing grossly intact, nares w/o erythema or drainage, poor dentition Eyes: PER, EOMI, sclera nonicteric.  Neck: Supple, no masses.  No bruit or JVD.  Pulmonary:  Good air movement, clear to auscultation bilaterally, no use of accessory muscles.  Cardiac: RRR, normal S1, S2, no Murmurs. Vascular: scattered varicosities present bilaterally.  Moderate venous stasis changes to the legs bilaterally.  4+ hard pitting  edema; skin changes extending up to the level of the hip consistent with the orange peel texture no open wounds or ulcers at this time. Vessel Right Left  Radial Palpable Palpable  PT Not Palpable Not Palpable  DP Palpable Palpable  Gastrointestinal: soft, non-distended. No guarding/no peritoneal signs.  Musculoskeletal: M/S 5/5 throughout.  Degenerative deformity multiple joints.  Neurologic: CN 2-12 intact. Pain and light touch intact in extremities.  Symmetrical.  Speech is fluent. Motor exam as listed above. Psychiatric: Judgment intact, Mood & affect appropriate for pt's clinical situation. Dermatologic: Venous rashes no ulcers noted.  No changes consistent with cellulitis. Lymph : No Cervical lymphadenopathy, no lichenification or skin changes of chronic lymphedema.  CBC Lab Results  Component Value Date   WBC 9.9 07/05/2019   HGB 10.7 (L) 07/05/2019   HCT 34.0 (L) 07/05/2019   MCV 86.1 07/05/2019   PLT 326 07/05/2019    BMET    Component Value Date/Time   NA 139 07/05/2019 1605   NA 139 06/17/2014 1811   K 4.8 07/05/2019 1605   K 3.6 06/17/2014 1811   CL 106 07/05/2019 1605   CL 111 (H) 06/17/2014 1811   CO2 22 07/05/2019 1605   CO2 22 06/17/2014 1811   GLUCOSE 112 (H) 07/05/2019 1605   GLUCOSE 99 06/17/2014 1811   BUN 18 07/05/2019 1605   BUN 7 06/17/2014 1811   CREATININE 0.92 07/05/2019 1605   CREATININE 0.98 06/17/2014 1811   CALCIUM 8.5 (L) 07/05/2019 1605   CALCIUM 8.5 06/17/2014 1811   GFRNONAA >60 07/05/2019 1605   GFRNONAA >60 06/17/2014 1811   GFRNONAA >60 12/13/2013 1628   GFRAA >60 07/05/2019 1605   GFRAA >60 06/17/2014 1811   GFRAA >60 12/13/2013 1628   Estimated Creatinine Clearance: 110.9 mL/min (by C-G formula based on SCr of 0.92 mg/dL).  COAG Lab Results  Component Value Date   INR 0.95 05/07/2015   INR 1.0 03/12/2013    Radiology No results found.   Assessment/Plan 1. Leg swelling Recommend:  No surgery or intervention at this  point in time.    I have reviewed my previous discussion with the patient regarding swelling and why  it causes symptoms.  Patient will continue wearing graduated compression stockings class 1 (20-30 mmHg) on a daily basis. The patient will  beginning wearing the stockings first thing in the morning and removing them in the evening. The patient is instructed specifically not to sleep in the stockings.    In addition, behavioral modification including several periods of elevation of the lower extremities during the day will be continued.  This was reviewed with the patient during the initial visit.  The patient will also continue routine exercise, especially walking on a daily basis as was discussed during the initial visit.    Despite conservative treatments including graduated compression therapy class 1 and behavioral modification including exercise and elevation the patient  has not obtained adequate control of the lymphedema.  The patient still has stage 3 lymphedema and therefore, I believe that a lymph pump should be added to improve the control of the patient's lymphedema.  Additionally, a lymph pump is warranted because it will reduce the risk of cellulitis and ulceration in the future.  Of note the patient has skin changes consistent with lymphatic disease up to and extending onto her abdomen.  This has been made worse with the multiple surgeries and infections.  Given these additional findings I believe that a lymph pump with an abdominal component or a pod is appropriate.  I have also discussed with her the importance of improving her protein levels and how this contributes to her lymphedema as well as the problem of chronic anemia contributing to her lymphedema.  These particular problems are being managed by her primary care.  Patient should follow-up in 1 month   A total of 65 minutes was spent with this patient and greater than 50% was spent in counseling and coordination of care with the  patient.  Discussion included the treatment options for vascular disease including indications for surgery and intervention.  Also discussed is the appropriate timing of treatment.  In addition medical therapy was discussed.    2. Hypertension associated with diabetes ([place]) Continue antihypertensive medications as already ordered, these medications have been reviewed and there are no changes at this time.   3. Moderate asthma, unspecified whether complicated, unspecified whether persistent Continue pulmonary medications and aerosols as already ordered, these medications have been reviewed and there are no changes at this time.    4. Gastroesophageal reflux disease without esophagitis Continue PPI as already ordered, this medication has been reviewed and there are no changes at this time.  Avoidence of caffeine and alcohol  Moderate elevation of the head of the bed   5. Type 2 diabetes mellitus without complication, without long-term current use of insulin ([facility]) Continue hypoglycemic medications as already ordered, these medications have been reviewed and there are no changes at this time.  Hgb A1C to be monitored as already arranged by primary service    Hortencia Pilar, MD  07/15/2019 12:00 PM

## 2019-07-16 ENCOUNTER — Inpatient Hospital Stay: Payer: Medicare HMO | Attending: Oncology

## 2019-07-16 ENCOUNTER — Ambulatory Visit (INDEPENDENT_AMBULATORY_CARE_PROVIDER_SITE_OTHER): Payer: Medicare HMO | Admitting: Vascular Surgery

## 2019-07-16 ENCOUNTER — Telehealth (INDEPENDENT_AMBULATORY_CARE_PROVIDER_SITE_OTHER): Payer: Self-pay

## 2019-07-16 ENCOUNTER — Other Ambulatory Visit: Payer: Self-pay

## 2019-07-16 ENCOUNTER — Encounter (INDEPENDENT_AMBULATORY_CARE_PROVIDER_SITE_OTHER): Payer: Self-pay | Admitting: Vascular Surgery

## 2019-07-16 VITALS — BP 104/69 | HR 98 | Resp 17 | Wt 354.0 lb

## 2019-07-16 DIAGNOSIS — I10 Essential (primary) hypertension: Secondary | ICD-10-CM | POA: Diagnosis not present

## 2019-07-16 DIAGNOSIS — K219 Gastro-esophageal reflux disease without esophagitis: Secondary | ICD-10-CM | POA: Diagnosis not present

## 2019-07-16 DIAGNOSIS — R627 Adult failure to thrive: Secondary | ICD-10-CM | POA: Diagnosis not present

## 2019-07-16 DIAGNOSIS — T8142XA Infection following a procedure, deep incisional surgical site, initial encounter: Secondary | ICD-10-CM | POA: Diagnosis not present

## 2019-07-16 DIAGNOSIS — E43 Unspecified severe protein-calorie malnutrition: Secondary | ICD-10-CM | POA: Diagnosis not present

## 2019-07-16 DIAGNOSIS — E119 Type 2 diabetes mellitus without complications: Secondary | ICD-10-CM

## 2019-07-16 DIAGNOSIS — M7989 Other specified soft tissue disorders: Secondary | ICD-10-CM | POA: Diagnosis not present

## 2019-07-16 DIAGNOSIS — E1159 Type 2 diabetes mellitus with other circulatory complications: Secondary | ICD-10-CM

## 2019-07-16 DIAGNOSIS — N179 Acute kidney failure, unspecified: Secondary | ICD-10-CM | POA: Diagnosis not present

## 2019-07-16 DIAGNOSIS — J45909 Unspecified asthma, uncomplicated: Secondary | ICD-10-CM | POA: Diagnosis not present

## 2019-07-16 DIAGNOSIS — A318 Other mycobacterial infections: Secondary | ICD-10-CM | POA: Diagnosis not present

## 2019-07-16 DIAGNOSIS — D509 Iron deficiency anemia, unspecified: Secondary | ICD-10-CM | POA: Insufficient documentation

## 2019-07-16 DIAGNOSIS — G894 Chronic pain syndrome: Secondary | ICD-10-CM | POA: Diagnosis not present

## 2019-07-16 DIAGNOSIS — M797 Fibromyalgia: Secondary | ICD-10-CM | POA: Diagnosis not present

## 2019-07-16 DIAGNOSIS — M793 Panniculitis, unspecified: Secondary | ICD-10-CM | POA: Diagnosis not present

## 2019-07-16 LAB — CBC WITH DIFFERENTIAL/PLATELET
Abs Immature Granulocytes: 0.03 10*3/uL (ref 0.00–0.07)
Basophils Absolute: 0.1 10*3/uL (ref 0.0–0.1)
Basophils Relative: 1 %
Eosinophils Absolute: 0.5 10*3/uL (ref 0.0–0.5)
Eosinophils Relative: 5 %
HCT: 34.2 % — ABNORMAL LOW (ref 36.0–46.0)
Hemoglobin: 10.9 g/dL — ABNORMAL LOW (ref 12.0–15.0)
Immature Granulocytes: 0 %
Lymphocytes Relative: 21 %
Lymphs Abs: 2.1 10*3/uL (ref 0.7–4.0)
MCH: 27.9 pg (ref 26.0–34.0)
MCHC: 31.9 g/dL (ref 30.0–36.0)
MCV: 87.5 fL (ref 80.0–100.0)
Monocytes Absolute: 0.7 10*3/uL (ref 0.1–1.0)
Monocytes Relative: 7 %
Neutro Abs: 6.6 10*3/uL (ref 1.7–7.7)
Neutrophils Relative %: 66 %
Platelets: 325 10*3/uL (ref 150–400)
RBC: 3.91 MIL/uL (ref 3.87–5.11)
RDW: 18.9 % — ABNORMAL HIGH (ref 11.5–15.5)
WBC: 10 10*3/uL (ref 4.0–10.5)
nRBC: 0 % (ref 0.0–0.2)

## 2019-07-16 LAB — RETIC PANEL
Immature Retic Fract: 15.3 % (ref 2.3–15.9)
RBC.: 3.91 MIL/uL (ref 3.87–5.11)
Retic Count, Absolute: 98.1 10*3/uL (ref 19.0–186.0)
Retic Ct Pct: 2.5 % (ref 0.4–3.1)
Reticulocyte Hemoglobin: 32.6 pg (ref >27.9)

## 2019-07-16 LAB — FERRITIN: Ferritin: 46 ng/mL (ref 11–307)

## 2019-07-16 LAB — TECHNOLOGIST SMEAR REVIEW: Plt Morphology: ADEQUATE

## 2019-07-16 LAB — IRON AND TIBC
Iron: 42 ug/dL (ref 28–170)
Saturation Ratios: 9 % — ABNORMAL LOW (ref 10.4–31.8)
TIBC: 452 ug/dL — ABNORMAL HIGH (ref 250–450)
UIBC: 410 ug/dL

## 2019-07-16 NOTE — Telephone Encounter (Signed)
Jason from [facility] called to see if Dr Delana Meyer would be fine with patient getting the compression stockings and compression pump through them for insurance purpose. I spoke with Dr Delana Meyer and he is fine with the request and Corene Cornea stated he will fax over the paperwork.

## 2019-07-17 ENCOUNTER — Inpatient Hospital Stay: Payer: Medicare HMO | Admitting: Oncology

## 2019-07-17 ENCOUNTER — Inpatient Hospital Stay: Payer: Medicare HMO

## 2019-07-17 ENCOUNTER — Encounter: Payer: Self-pay | Admitting: Oncology

## 2019-07-17 ENCOUNTER — Other Ambulatory Visit: Payer: Self-pay

## 2019-07-17 DIAGNOSIS — N179 Acute kidney failure, unspecified: Secondary | ICD-10-CM | POA: Diagnosis not present

## 2019-07-17 DIAGNOSIS — M793 Panniculitis, unspecified: Secondary | ICD-10-CM | POA: Diagnosis not present

## 2019-07-17 DIAGNOSIS — T8142XA Infection following a procedure, deep incisional surgical site, initial encounter: Secondary | ICD-10-CM | POA: Diagnosis not present

## 2019-07-17 DIAGNOSIS — E43 Unspecified severe protein-calorie malnutrition: Secondary | ICD-10-CM | POA: Diagnosis not present

## 2019-07-17 DIAGNOSIS — R627 Adult failure to thrive: Secondary | ICD-10-CM | POA: Diagnosis not present

## 2019-07-17 DIAGNOSIS — A318 Other mycobacterial infections: Secondary | ICD-10-CM | POA: Diagnosis not present

## 2019-07-17 NOTE — Progress Notes (Signed)
Patient pre screened for office appointment, no questions or concerns today. 

## 2019-07-18 ENCOUNTER — Inpatient Hospital Stay: Payer: Medicare HMO

## 2019-07-18 ENCOUNTER — Other Ambulatory Visit: Payer: Self-pay

## 2019-07-18 ENCOUNTER — Inpatient Hospital Stay (HOSPITAL_BASED_OUTPATIENT_CLINIC_OR_DEPARTMENT_OTHER): Payer: Medicare HMO | Admitting: Oncology

## 2019-07-18 VITALS — BP 128/81 | HR 82 | Temp 97.3°F

## 2019-07-18 VITALS — BP 107/72 | HR 100 | Temp 97.1°F

## 2019-07-18 DIAGNOSIS — D509 Iron deficiency anemia, unspecified: Secondary | ICD-10-CM | POA: Diagnosis not present

## 2019-07-18 DIAGNOSIS — Z9884 Bariatric surgery status: Secondary | ICD-10-CM

## 2019-07-18 MED ORDER — IRON SUCROSE 20 MG/ML IV SOLN
200.0000 mg | Freq: Once | INTRAVENOUS | Status: AC
  Administered 2019-07-18: 200 mg via INTRAVENOUS
  Filled 2019-07-18: qty 10

## 2019-07-18 MED ORDER — SODIUM CHLORIDE 0.9 % IV SOLN
Freq: Once | INTRAVENOUS | Status: AC
  Administered 2019-07-18: 11:00:00 via INTRAVENOUS
  Filled 2019-07-18: qty 250

## 2019-07-18 NOTE — Progress Notes (Addendum)
Hematology/Oncology follow up note [facility] [phone] [phone]   Patient Care Team: Idelle Crouch, MD as PCP - General (Internal Medicine)  REFERRING PROVIDER: Idelle Crouch, MD CHIEF COMPLAINTS/REASON FOR VISIT:  Follow up  iron deficiency anemia  HISTORY OF PRESENTING ILLNESS:  Karen Lewis is a  48 y.o.  female with PMH listed below was seen in consultation at the request of Idelle Crouch, MD   for evaluation of iron deficiency anemia.   Reviewed patient's recent labs  Labs revealed anemia with hemoglobin of .   Reviewed patient's previous labs  05/16/2019 CBC showed hemoglobin 9.4, WBC 11.4, MCV 68.7, platelet count 352,000. Iron panel showed ferritin of 8, iron 34, folate 19.3, No aggravating or improving factors.  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  She follows up with ID Dr. Tama High.    Associated signs and symptoms: Patient reports fatigue.  Associated with SOB with exertion.  Denies weight loss, easy bruising, hematochezia, hemoptysis, hematuria. Context:  History of iron deficiency: Denies Rectal bleeding: Denies denies Menstrual bleeding/ Vaginal bleeding : Denies Hematemesis or hemoptysis : denies Blood in urine : denies  Last endoscopy: History of gastric sleeve.  And then history of gastric bypass on 09/21/ 2018 Fatigue: Yes.  SOB: Yes  +Chronic lower extremity swelling/lymphedema. Moved from [place] 10 years ago.  Previously worked as a Marine scientist.  INTERVAL HISTORY Karen Lewis is a 48 y.o. female who has above history reviewed by me today presents for follow up visit for management of iron deficiency anemia Problems and complaints are listed below: Patient has received IV Venofer x 1.  She was admitted to [facility] 06/02/2019.  There she received 1 unit of PRBC and also received 1000 mg IV iron  dextran. Today she reports feeling better. 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  Recently finished antibiotics and reports that the wound is healing well. Denies any fever, chills, nausea, vomiting, shortness of breath,.  Chronic fatigue slightly better. Review of Systems  Constitutional: Positive for fatigue. Negative for appetite change, chills and fever.  HENT:   Negative for hearing loss and voice change.   Eyes: Negative for eye problems.  Respiratory: Negative for chest tightness, cough and shortness of breath.   Cardiovascular: Positive for leg swelling. Negative for chest pain.  Gastrointestinal: Negative for abdominal distention, abdominal pain and blood in stool.  Endocrine: Negative for hot flashes.  Genitourinary: Negative for difficulty urinating and frequency.   Musculoskeletal: Negative for arthralgias.  Skin: Negative for itching and rash.  Neurological: Negative for extremity weakness.  Hematological: Negative for adenopathy.  Psychiatric/Behavioral: Negative for confusion.    MEDICAL HISTORY:  Past Medical History:  Diagnosis Date  . Anemia   . Anginal pain ([place])   . Anxiety   . Arthralgia of hip 07/29/2015  . Arthritis   . Arthritis, degenerative 07/29/2015  . Asthma   . Cephalalgia 07/25/2014  . Dependence on unknown drug ([place])    multiplt controlled drug dependence  . Depression   . Diabetes mellitus without complication ([place])   . Dysrhythmia   . Eczema   . Fibromyalgia   . Gastritis   . GERD (gastroesophageal reflux disease)   . Gonalgia 07/29/2015   Overview:  Overview:  The patient has had bilateral intra-articular Hyalgan injections done on 07/16/2014  and although she seems to do well with this type of therapy, apparently her insurance company does not want to pay for they Hyalgan. On 11/27/2014 the patient underwent a bilateral genicular nerve block with excellent  results. On 01/28/2015 she had a right knee genicular radiofrequency ablatio  . Gout   . H/O cardiovascular disorder 03/10/2015  . H/O surgical procedure 12/05/2012   Overview:  LSG ([facility] - April 2013)    . H/O thyroid disease 03/10/2015  . Headache   . Herpes   . History of artificial joint 07/29/2015  . History of hiatal hernia   . Hypertension   . Hypomagnesemia   . Hypothyroidism   . IDA (iron deficiency anemia) 05/28/2019  . LBP (low back pain) 07/29/2015  . Neuromuscular disorder ([place])   . Obesity   . PCOS (polycystic ovarian syndrome)   . Primary osteoarthritis of both knees 07/29/2015  . Sleep apnea   . Thyroid nodule bilateral    SURGICAL HISTORY: Past Surgical History:  Procedure Laterality Date  . CARPAL TUNNEL RELEASE Bilateral   . CHOLECYSTECTOMY    . DIAGNOSTIC LAPAROSCOPY    . HIATAL HERNIA REPAIR    . JOINT REPLACEMENT Left hip  . LAPAROSCOPIC PARTIAL GASTRECTOMY    . left trigger finger    . peniculectomy N/A 07/05/2018  . ROUX-EN-Y GASTRIC BYPASS  06/03/2017  . SHOULDER ARTHROSCOPY Right   . THYROIDECTOMY N/A 11/12/2015   Procedure: THYROIDECTOMY;  Surgeon: Clyde Canterbury, MD;  Location: [facility] ORS;  Service: ENT;  Laterality: N/A;  . TRIGGER FINGER RELEASE Right     SOCIAL HISTORY: Social History   Socioeconomic History  . Marital status: Significant Other    Spouse name: Not on file  . Number of children: Not on file  . Years of education: Not on file  . Highest education level: Not on file  Occupational History  . Not on file  Social Needs  . Financial resource strain: Not on file  . Food insecurity    Worry: Not on file    Inability: Not on file  . Transportation needs    Medical: Not on file    Non-medical: Not on file  Tobacco Use  . Smoking status: Never Smoker  . Smokeless tobacco: Never Used  Substance and Sexual Activity  . Alcohol use: No    Alcohol/week: 0.0 standard drinks  . Drug use: No  . Sexual activity: Not Currently     Birth control/protection: Injection  Lifestyle  . Physical activity    Days per week: Not on file    Minutes per session: Not on file  . Stress: Not on file  Relationships  . Social Herbalist on phone: Not on file    Gets together: Not on file    Attends religious service: Not on file    Active member of club or organization: Not on file    Attends meetings of clubs or organizations: Not on file    Relationship status: Not on file  . Intimate partner violence    Fear of current or ex partner: Not on file    Emotionally abused: Not on file    Physically abused: Not on file    Forced sexual activity: Not on file  Other Topics Concern  . Not on file  Social History Narrative  . Not on file    FAMILY HISTORY: Family History  Problem Relation Age of Onset  . Anxiety disorder Mother   . Depression  Mother   . Alcohol abuse Mother   . Diabetes Mother   . Hypertension Mother   . Kidney cancer Mother   . Sleep apnea Mother   . Alcohol abuse Father   . Anxiety disorder Father   . Depression Father   . Post-traumatic stress disorder Father   . Kidney failure Father   . COPD Father   . Diabetes Father   . Hypertension Father   . Sleep apnea Father   . Depression Brother   . Diabetes Brother   . Hypertension Brother   . Sleep apnea Brother   . Breast cancer Paternal Aunt   . Bladder Cancer Neg Hx   . Prostate cancer Neg Hx     ALLERGIES:  is allergic to bactrim [sulfamethoxazole-trimethoprim]; omalizumab; ciprofloxacin; shellfish allergy; trimethoprim; aspirin; clindamycin; motrin [ibuprofen]; nsaids; other; and sulfa antibiotics.  MEDICATIONS:  Current Outpatient Medications  Medication Sig Dispense Refill  . ACCU-CHEK AVIVA PLUS test strip     . ACETAMINOPHEN PO Take 650 mg by mouth every 6 (six) hours as needed.    Marland Kitchen acyclovir (ZOVIRAX) 800 MG tablet Take 800 mg by mouth as needed. Reported on 11/12/2015    . albuterol (PROVENTIL HFA;VENTOLIN HFA) 108 (90  BASE) MCG/ACT inhaler Inhale 2 puffs into the lungs every 6 (six) hours as needed for wheezing or shortness of breath.    Marland Kitchen amitriptyline (ELAVIL) 100 MG tablet   3  . Biotin 10 MG CAPS     . calcium carbonate (TUMS - DOSED IN MG ELEMENTAL CALCIUM) 500 MG chewable tablet Chew 1 tablet by mouth daily.    . clotrimazole (MYCELEX) 10 MG troche Take 10 mg by mouth as needed.  3  . diclofenac sodium (VOLTAREN) 1 % GEL Apply 2 g topically 4 (four) times daily. 100 g 1  . diphenhydrAMINE (BENADRYL) 25 mg capsule Take 25 mg by mouth as needed.    . Emollient (A + D PERSONAL CARE LOTION EX) Apply 1 application topically as needed.    Marland Kitchen EPINEPHrine 0.3 mg/0.3 mL IJ SOAJ injection Inject 0.3 mg into the skin as needed.    Marland Kitchen ketoconazole (NIZORAL) 2 % cream APPLY AS DIRECTED TO AFFECTED AREA TWICE DAILY AS NEEDED    . levothyroxine (SYNTHROID) 125 MCG tablet Take 250 mcg by mouth.     Marland Kitchen lisinopril (ZESTRIL) 5 MG tablet Take by mouth.    . magnesium oxide (MAG-OX) 400 MG tablet Take 1 tablet (400 mg total) by mouth 2 (two) times daily. 180 tablet 0  . metFORMIN (GLUCOPHAGE) 1000 MG tablet Take 1,000 mg by mouth 2 (two) times daily with a meal.     . metoprolol tartrate (LOPRESSOR) 50 MG tablet Take 50 mg by mouth 2 (two) times daily.     . montelukast (SINGULAIR) 10 MG tablet Take 10 mg by mouth at bedtime.     . Multiple Vitamin (MULTI-VITAMIN) tablet Take 2 tablets by mouth daily.    . naloxone (NARCAN) nasal spray 4 mg/0.1 mL Spray into one nostril. Repeat with second device into other nostril after 2-3 minutes if no or minimal response. 1 kit 0  . nystatin (MYCOSTATIN) 100000 UNIT/ML suspension Swish and swallow 2 mLs 4 (four) times daily    . omeprazole (PRILOSEC) 40 MG capsule Take 40 mg by mouth 2 (two) times daily.     . ondansetron (ZOFRAN-ODT) 4 MG disintegrating tablet Take 4 mg by mouth as needed.  0  . oxyCODONE (OXY IR/ROXICODONE) 5 MG  immediate release tablet Take 1 tablet (5 mg total) by  mouth 5 (five) times daily. Must last 30 days 150 tablet 0  . [START ON 08/08/2019] oxyCODONE (OXY IR/ROXICODONE) 5 MG immediate release tablet Take 1 tablet (5 mg total) by mouth 5 (five) times daily. Must last 30 days 150 tablet 0  . pregabalin (LYRICA) 225 MG capsule Take 1 capsule (225 mg total) by mouth 2 (two) times daily. 180 capsule 0  . QUEtiapine (SEROQUEL) 100 MG tablet Take 100 mg by mouth at bedtime.    . rosuvastatin (CRESTOR) 40 MG tablet     . sertraline (ZOLOFT) 100 MG tablet Take 200 mg by mouth daily.     Marland Kitchen spironolactone (ALDACTONE) 25 MG tablet Take 37.5 mg by mouth 2 (two) times daily.     Marland Kitchen tiZANidine (ZANAFLEX) 4 MG tablet Take by mouth.    . triamcinolone (NASACORT AQ) 55 MCG/ACT AERO nasal inhaler 2 sprays by Nasal route 2 (two) times daily.    . vitamin C (ASCORBIC ACID) 500 MG tablet Take 500 mg by mouth daily.    . Vitamin D, Ergocalciferol, (DRISDOL) 1.25 MG (50000 UT) CAPS capsule     . zolpidem (AMBIEN) 10 MG tablet Take 10 mg by mouth at bedtime as needed for sleep.    Marland Kitchen zonisamide (ZONEGRAN) 50 MG capsule Take 150 mg by mouth daily. 3 tabs at bedtime     No current facility-administered medications for this visit.      PHYSICAL EXAMINATION: ECOG PERFORMANCE STATUS: 2 - Symptomatic, <50% confined to bed Vitals:   07/18/19 0951  BP: 128/81  Pulse: 82  Temp: (!) 97.3 F (36.3 C)   Filed Weights    Physical Exam Constitutional:      General: She is not in acute distress.    Appearance: She is obese.  HENT:     Head: Normocephalic and atraumatic.  Eyes:     General: No scleral icterus.    Pupils: Pupils are equal, round, and reactive to light.  Neck:     Musculoskeletal: Normal range of motion and neck supple.  Cardiovascular:     Rate and Rhythm: Normal rate and regular rhythm.     Heart sounds: Normal heart sounds.  Pulmonary:     Effort: Pulmonary effort is normal. No respiratory distress.     Breath sounds: No wheezing.  Abdominal:      General: Bowel sounds are normal. There is no distension.     Palpations: Abdomen is soft. There is no mass.     Tenderness: There is no abdominal tenderness.  Musculoskeletal: Normal range of motion.        General: No deformity.  Skin:    General: Skin is warm and dry.     Coloration: Skin is not pale.     Findings: No erythema or rash.  Neurological:     Mental Status: She is alert and oriented to person, place, and time.     Cranial Nerves: No cranial nerve deficit.     Coordination: Coordination normal.  Psychiatric:        Behavior: Behavior normal.        Thought Content: Thought content normal.       CMP Latest Ref Rng & Units 07/05/2019  Glucose 70 - 99 mg/dL 112(H)  BUN 6 - 20 mg/dL 18  Creatinine 0.44 - 1.00 mg/dL 0.92  Sodium 135 - 145 mmol/L 139  Potassium 3.5 - 5.1 mmol/L 4.8  Chloride 98 -  111 mmol/L 106  CO2 22 - 32 mmol/L 22  Calcium 8.9 - 10.3 mg/dL 8.5(L)  Total Protein 6.5 - 8.1 g/dL 6.8  Total Bilirubin 0.3 - 1.2 mg/dL 0.6  Alkaline Phos 38 - 126 U/L 117  AST 15 - 41 U/L 198(H)  ALT 0 - 44 U/L 140(H)   CBC Latest Ref Rng & Units 07/16/2019  WBC 4.0 - 10.5 K/uL 10.0  Hemoglobin 12.0 - 15.0 g/dL 10.9(L)  Hematocrit 36.0 - 46.0 % 34.2(L)  Platelets 150 - 400 K/uL 325     LABORATORY DATA:  I have reviewed the data as listed Lab Results  Component Value Date   WBC 10.0 07/16/2019   HGB 10.9 (L) 07/16/2019   HCT 34.2 (L) 07/16/2019   MCV 87.5 07/16/2019   PLT 325 07/16/2019   Recent Labs    04/12/19 1240 04/20/19 1419 05/22/19 1445 05/29/19 1612 07/05/19 1605  NA 137  --  137  137 139 139  K 4.0  --  5.2*  5.2* 4.7 4.8  CL 109  --  107  107 110 106  CO2 18*  --  19*  19* 21* 22  GLUCOSE 152*  --  91  94 119* 112*  BUN 21*  --  29*  30* 22* 18  CREATININE 1.10*  --  1.19*  1.22* 1.05* 0.92  CALCIUM 7.7*  --  7.2*  7.3* 6.9* 8.5*  GFRNONAA 59*  --  54*  52* >60 >60  GFRAA >60  --  >60  >60 >60 >60  PROT 5.8* 6.0* 6.0* 6.0*  6.8  ALBUMIN 2.7*  2.7* 2.7* 2.8*  3.0* 2.8* 3.5  AST 112* 111* 192* 169* 198*  ALT 63* 66* 97* 86* 140*  ALKPHOS 120 143* 176* 190* 117  BILITOT 0.6 0.5 0.6 0.9 0.6  BILIDIR 0.1 0.1  --   --  0.1  IBILI 0.5 0.4  --   --  0.5   Iron/TIBC/Ferritin/ %Sat    Component Value Date/Time   IRON 42 07/16/2019 1234   TIBC 452 (H) 07/16/2019 1234   FERRITIN 46 07/16/2019 1234   IRONPCTSAT 9 (L) 07/16/2019 1234     No results found.    ASSESSMENT & PLAN:  1. Iron deficiency anemia, unspecified iron deficiency anemia type   2. Hypomagnesemia   3. History of gastric bypass    #Labs reviewed and discussed with patient. She only got 1 dose of Venofer since last visit. She also got  INFeD 1000 mg IV in September while she was admitted to [facility]. Iron panel has improved as well as hemoglobin. Hemoglobin increased to 10.9, Ferritin improved to 46, she still has a low iron saturation of 9 as well as increased TIBC 452.  Indicating still having iron deficiency. Patient prefers to have " stronger IV iron infusion" as the iron products she gets in the [place]. I discussed with patient that our pharmacy does not carry INFeD, Feraheme can be considered.  Side effects including infusion reaction was discussed with patient. Patient agrees with continuing Venofer treatments.  We will plan IV Venofer twice weekly for another 4 doses.  Repeat iron panel and blood work in 4 weeks.  MD evaluation for additional need of IV iron.  History of gastric bypass, high risk of vitamin deficiency.  We will continue to monitor.  She needs GI to reevaluate GI tract in the future for deficiency anemia. Check vitamin B12 and folate level.   #Persistent hypomagnesium.  Patient is  taking magnesium oxide 400 mg twice daily.  Patient receives IV magnesium infusions in the past.  Slow-Mag can be considered as an option.  Malabsorption due to history of bariatric surgery is possible.  Discussed with patient.  Recommend patient  to further discuss with primary care provider.  #  Orders Placed This Encounter  Procedures  . CBC with Differential/Platelet    Standing Status:   Future    Standing Expiration Date:   07/17/2020  . Ferritin    Standing Status:   Future    Standing Expiration Date:   07/17/2020  . Iron and TIBC    Standing Status:   Future    Standing Expiration Date:   07/17/2020    All questions were answered. The patient knows to call the clinic with any problems questions or concerns.  Cc Idelle Crouch, MD  Return of visit: 4 weeks.   Earlie Server, MD, PhD Hematology Oncology [facility] Pager- [phone] 07/18/2019

## 2019-07-18 NOTE — Progress Notes (Signed)
Patient does not offer any problems today.  

## 2019-07-19 ENCOUNTER — Ambulatory Visit: Payer: Medicare HMO

## 2019-07-19 DIAGNOSIS — E43 Unspecified severe protein-calorie malnutrition: Secondary | ICD-10-CM | POA: Diagnosis not present

## 2019-07-19 DIAGNOSIS — T8142XA Infection following a procedure, deep incisional surgical site, initial encounter: Secondary | ICD-10-CM | POA: Diagnosis not present

## 2019-07-19 DIAGNOSIS — R627 Adult failure to thrive: Secondary | ICD-10-CM | POA: Diagnosis not present

## 2019-07-19 DIAGNOSIS — M793 Panniculitis, unspecified: Secondary | ICD-10-CM | POA: Diagnosis not present

## 2019-07-19 DIAGNOSIS — N179 Acute kidney failure, unspecified: Secondary | ICD-10-CM | POA: Diagnosis not present

## 2019-07-19 DIAGNOSIS — A318 Other mycobacterial infections: Secondary | ICD-10-CM | POA: Diagnosis not present

## 2019-07-20 ENCOUNTER — Other Ambulatory Visit (HOSPITAL_COMMUNITY)
Admission: RE | Admit: 2019-07-20 | Discharge: 2019-07-20 | Disposition: A | Discharge status: Home / Self Care | Payer: Medicare HMO | Source: Ambulatory Visit | Attending: Obstetrics & Gynecology | Admitting: Obstetrics & Gynecology

## 2019-07-20 ENCOUNTER — Encounter: Payer: Self-pay | Admitting: Obstetrics & Gynecology

## 2019-07-20 ENCOUNTER — Other Ambulatory Visit: Payer: Self-pay

## 2019-07-20 ENCOUNTER — Ambulatory Visit (INDEPENDENT_AMBULATORY_CARE_PROVIDER_SITE_OTHER): Payer: Medicare HMO | Admitting: Obstetrics & Gynecology

## 2019-07-20 VITALS — BP 128/80 | Ht 64.0 in | Wt 350.0 lb

## 2019-07-20 DIAGNOSIS — Z112 Encounter for screening for other bacterial diseases: Secondary | ICD-10-CM | POA: Diagnosis not present

## 2019-07-20 DIAGNOSIS — M797 Fibromyalgia: Secondary | ICD-10-CM | POA: Diagnosis not present

## 2019-07-20 DIAGNOSIS — Z124 Encounter for screening for malignant neoplasm of cervix: Secondary | ICD-10-CM | POA: Diagnosis not present

## 2019-07-20 DIAGNOSIS — I10 Essential (primary) hypertension: Secondary | ICD-10-CM | POA: Diagnosis not present

## 2019-07-20 DIAGNOSIS — R399 Unspecified symptoms and signs involving the genitourinary system: Secondary | ICD-10-CM | POA: Diagnosis not present

## 2019-07-20 DIAGNOSIS — Z1151 Encounter for screening for human papillomavirus (HPV): Secondary | ICD-10-CM | POA: Insufficient documentation

## 2019-07-20 DIAGNOSIS — Z01419 Encounter for gynecological examination (general) (routine) without abnormal findings: Secondary | ICD-10-CM

## 2019-07-20 DIAGNOSIS — N3000 Acute cystitis without hematuria: Secondary | ICD-10-CM | POA: Diagnosis not present

## 2019-07-20 DIAGNOSIS — L0390 Cellulitis, unspecified: Secondary | ICD-10-CM | POA: Diagnosis not present

## 2019-07-20 DIAGNOSIS — R790 Abnormal level of blood mineral: Secondary | ICD-10-CM | POA: Diagnosis not present

## 2019-07-20 NOTE — Progress Notes (Signed)
HPI:      [name] is a 48 y.o. G0P0000 who LMP was No LMP recorded. Patient has had an injection., she presents today for her annual examination. The patient has no complaints today. Recent ABX use (PIC line for IV ABX for cellulitis) and now has dysuria nad vag odor; no discharge noted. The patient is not currently sexually active. Her last pap: approximate date 2017 and was normal and last mammogram: approximate date 2019 and was normal. The patient does perform self breast exams.  There is notable family history of breast or ovarian cancer in her family.  The patient has regular exercise: yes.  The patient denies current symptoms of depression.    GYN History: Contraception: none  PMHx: Past Medical History:  Diagnosis Date  . Anemia   . Anginal pain ([place])   . Anxiety   . Arthralgia of hip 07/29/2015  . Arthritis   . Arthritis, degenerative 07/29/2015  . Asthma   . Cephalalgia 07/25/2014  . Dependence on unknown drug ([place])    multiplt controlled drug dependence  . Depression   . Diabetes mellitus without complication ([place])   . Dysrhythmia   . Eczema   . Fibromyalgia   . Gastritis   . GERD (gastroesophageal reflux disease)   . Gonalgia 07/29/2015   Overview:  Overview:  The patient has had bilateral intra-articular Hyalgan injections done on 07/16/2014 and although she seems to do well with this type of therapy, apparently her insurance company does not want to pay for they Hyalgan. On 11/27/2014 the patient underwent a bilateral genicular nerve block with excellent results. On 01/28/2015 she had a right knee genicular radiofrequency ablatio  . Gout   . H/O cardiovascular disorder 03/10/2015  . H/O surgical procedure 12/05/2012   Overview:  LSG ([facility] - April 2013)    . H/O thyroid disease 03/10/2015  . Headache   . Herpes   . History of artificial joint 07/29/2015  . History of hiatal hernia   . Hypertension   . Hypomagnesemia   . Hypothyroidism   . IDA (iron  deficiency anemia) 05/28/2019  . LBP (low back pain) 07/29/2015  . Neuromuscular disorder ([place])   . Obesity   . PCOS (polycystic ovarian syndrome)   . Primary osteoarthritis of both knees 07/29/2015  . Sleep apnea   . Thyroid nodule bilateral   Past Surgical History:  Procedure Laterality Date  . APPENDECTOMY    . CARPAL TUNNEL RELEASE Bilateral   . CHOLECYSTECTOMY    . DIAGNOSTIC LAPAROSCOPY    . HIATAL HERNIA REPAIR    . JOINT REPLACEMENT Left hip  . LAPAROSCOPIC PARTIAL GASTRECTOMY    . left trigger finger    . peniculectomy N/A 07/05/2018  . ROUX-EN-Y GASTRIC BYPASS  06/03/2017  . SHOULDER ARTHROSCOPY Right   . THYROIDECTOMY N/A 11/12/2015   Procedure: THYROIDECTOMY;  Surgeon: Clyde Canterbury, MD;  Location: [facility] ORS;  Service: ENT;  Laterality: N/A;  . TRIGGER FINGER RELEASE Right    Family History  Problem Relation Age of Onset  . Anxiety disorder Mother   . Depression Mother   . Alcohol abuse Mother   . Diabetes Mother   . Hypertension Mother   . Kidney cancer Mother   . Sleep apnea Mother   . Alcohol abuse Father   . Anxiety disorder Father   . Depression Father   . Post-traumatic stress disorder Father   . Kidney failure Father   . COPD Father   .  Diabetes Father   . Hypertension Father   . Sleep apnea Father   . Depression Brother   . Diabetes Brother   . Hypertension Brother   . Sleep apnea Brother   . Breast cancer Paternal Aunt   . Bladder Cancer Neg Hx   . Prostate cancer Neg Hx    Social History   Tobacco Use  . Smoking status: Never Smoker  . Smokeless tobacco: Never Used  Substance Use Topics  . Alcohol use: No    Alcohol/week: 0.0 standard drinks  . Drug use: No    Current Outpatient Medications:  .  ACCU-CHEK AVIVA PLUS test strip, , Disp: , Rfl:  .  ACETAMINOPHEN PO, Take 650 mg by mouth every 6 (six) hours as needed., Disp: , Rfl:  .  acyclovir (ZOVIRAX) 800 MG tablet, Take 800 mg by mouth as needed. Reported on 11/12/2015, Disp: , Rfl:   .  albuterol (PROVENTIL HFA;VENTOLIN HFA) 108 (90 BASE) MCG/ACT inhaler, Inhale 2 puffs into the lungs every 6 (six) hours as needed for wheezing or shortness of breath., Disp: , Rfl:  .  amitriptyline (ELAVIL) 100 MG tablet, , Disp: , Rfl: 3 .  Biotin 10 MG CAPS, , Disp: , Rfl:  .  calcium carbonate (TUMS - DOSED IN MG ELEMENTAL CALCIUM) 500 MG chewable tablet, Chew 1 tablet by mouth daily., Disp: , Rfl:  .  clotrimazole (MYCELEX) 10 MG troche, Take 10 mg by mouth as needed., Disp: , Rfl: 3 .  diclofenac sodium (VOLTAREN) 1 % GEL, Apply 2 g topically 4 (four) times daily., Disp: 100 g, Rfl: 1 .  diphenhydrAMINE (BENADRYL) 25 mg capsule, Take 25 mg by mouth as needed., Disp: , Rfl:  .  Emollient (A + D PERSONAL CARE LOTION EX), Apply 1 application topically as needed., Disp: , Rfl:  .  EPINEPHrine 0.3 mg/0.3 mL IJ SOAJ injection, Inject 0.3 mg into the skin as needed., Disp: , Rfl:  .  ketoconazole (NIZORAL) 2 % cream, APPLY AS DIRECTED TO AFFECTED AREA TWICE DAILY AS NEEDED, Disp: , Rfl:  .  levothyroxine (SYNTHROID) 125 MCG tablet, Take 250 mcg by mouth. , Disp: , Rfl:  .  lisinopril (ZESTRIL) 5 MG tablet, Take by mouth., Disp: , Rfl:  .  magnesium oxide (MAG-OX) 400 MG tablet, Take 1 tablet (400 mg total) by mouth 2 (two) times daily., Disp: 180 tablet, Rfl: 0 .  metFORMIN (GLUCOPHAGE) 1000 MG tablet, Take 1,000 mg by mouth 2 (two) times daily with a meal. , Disp: , Rfl:  .  metoprolol tartrate (LOPRESSOR) 50 MG tablet, Take 50 mg by mouth 2 (two) times daily. , Disp: , Rfl:  .  montelukast (SINGULAIR) 10 MG tablet, Take 10 mg by mouth at bedtime. , Disp: , Rfl:  .  Multiple Vitamin (MULTI-VITAMIN) tablet, Take 2 tablets by mouth daily., Disp: , Rfl:  .  naloxone (NARCAN) nasal spray 4 mg/0.1 mL, Spray into one nostril. Repeat with second device into other nostril after 2-3 minutes if no or minimal response., Disp: 1 kit, Rfl: 0 .  nystatin (MYCOSTATIN) 100000 UNIT/ML suspension, Swish and  swallow 2 mLs 4 (four) times daily, Disp: , Rfl:  .  omeprazole (PRILOSEC) 40 MG capsule, Take 40 mg by mouth 2 (two) times daily. , Disp: , Rfl:  .  ondansetron (ZOFRAN-ODT) 4 MG disintegrating tablet, Take 4 mg by mouth as needed., Disp: , Rfl: 0 .  oxyCODONE (OXY IR/ROXICODONE) 5 MG immediate release tablet, Take 1  tablet (5 mg total) by mouth 5 (five) times daily. Must last 30 days, Disp: 150 tablet, Rfl: 0 .  [START ON 08/08/2019] oxyCODONE (OXY IR/ROXICODONE) 5 MG immediate release tablet, Take 1 tablet (5 mg total) by mouth 5 (five) times daily. Must last 30 days, Disp: 150 tablet, Rfl: 0 .  pregabalin (LYRICA) 225 MG capsule, Take 1 capsule (225 mg total) by mouth 2 (two) times daily., Disp: 180 capsule, Rfl: 0 .  QUEtiapine (SEROQUEL) 100 MG tablet, Take 100 mg by mouth at bedtime., Disp: , Rfl:  .  rosuvastatin (CRESTOR) 40 MG tablet, , Disp: , Rfl:  .  sertraline (ZOLOFT) 100 MG tablet, Take 200 mg by mouth daily. , Disp: , Rfl:  .  spironolactone (ALDACTONE) 25 MG tablet, Take 37.5 mg by mouth 2 (two) times daily. , Disp: , Rfl:  .  tiZANidine (ZANAFLEX) 4 MG tablet, Take by mouth., Disp: , Rfl:  .  triamcinolone (NASACORT AQ) 55 MCG/ACT AERO nasal inhaler, 2 sprays by Nasal route 2 (two) times daily., Disp: , Rfl:  .  vitamin C (ASCORBIC ACID) 500 MG tablet, Take 500 mg by mouth daily., Disp: , Rfl:  .  Vitamin D, Ergocalciferol, (DRISDOL) 1.25 MG (50000 UT) CAPS capsule, , Disp: , Rfl:  .  zolpidem (AMBIEN) 10 MG tablet, Take 10 mg by mouth at bedtime as needed for sleep., Disp: , Rfl:  .  zonisamide (ZONEGRAN) 50 MG capsule, Take 150 mg by mouth daily. 3 tabs at bedtime, Disp: , Rfl:  Allergies: Bactrim [sulfamethoxazole-trimethoprim], Omalizumab, Ciprofloxacin, Shellfish allergy, Trimethoprim, Aspirin, Clindamycin, Motrin [ibuprofen], Nsaids, Other, and Sulfa antibiotics  Review of Systems  Constitutional: Negative for chills, fever and malaise/fatigue.  HENT: Negative for  congestion, sinus pain and sore throat.   Eyes: Negative for blurred vision and pain.  Respiratory: Negative for cough and wheezing.   Cardiovascular: Negative for chest pain and leg swelling.  Gastrointestinal: Negative for abdominal pain, constipation, diarrhea, heartburn, nausea and vomiting.  Genitourinary: Negative for dysuria, frequency, hematuria and urgency.  Musculoskeletal: Negative for back pain, joint pain, myalgias and neck pain.  Skin: Negative for itching and rash.  Neurological: Negative for dizziness, tremors and weakness.  Endo/Heme/Allergies: Does not bruise/bleed easily.  Psychiatric/Behavioral: Negative for depression. The patient is not nervous/anxious and does not have insomnia.     Objective: BP 128/80   Ht '5\' 4"'  (1.626 m)   Wt (!) 350 lb (158.8 kg)   BMI 60.08 kg/m   Filed Weights   07/20/19 1434  Weight: (!) 350 lb (158.8 kg)   Body mass index is 60.08 kg/m. Physical Exam Constitutional:      General: She is not in acute distress.    Appearance: She is well-developed. She is obese.  Genitourinary:     Pelvic exam was performed with patient supine.     Vagina, uterus and rectum normal.     No lesions in the vagina.     No vaginal bleeding.     No cervical motion tenderness, friability, lesion or polyp.     Uterus is mobile.     Uterus is not enlarged.     No uterine mass detected.    Uterus is midaxial.     No right or left adnexal mass present.     Right adnexa not tender.     Left adnexa not tender.  HENT:     Head: Normocephalic and atraumatic. No laceration.     Right Ear: Hearing normal.  Left Ear: Hearing normal.     Mouth/Throat:     Pharynx: Uvula midline.  Eyes:     Pupils: Pupils are equal, round, and reactive to light.  Neck:     Musculoskeletal: Normal range of motion and neck supple.     Thyroid: No thyromegaly.  Cardiovascular:     Rate and Rhythm: Normal rate and regular rhythm.     Heart sounds: No murmur. No friction  rub. No gallop.   Pulmonary:     Effort: Pulmonary effort is normal. No respiratory distress.     Breath sounds: Normal breath sounds. No wheezing.  Chest:     Breasts:        Right: No mass, skin change or tenderness.        Left: No mass, skin change or tenderness.  Abdominal:     General: Bowel sounds are normal. There is no distension.     Palpations: Abdomen is soft.     Tenderness: There is no abdominal tenderness. There is no rebound.  Musculoskeletal: Normal range of motion.  Neurological:     Mental Status: She is alert and oriented to person, place, and time.     Cranial Nerves: No cranial nerve deficit.  Skin:    General: Skin is warm and dry.  Psychiatric:        Judgment: Judgment normal.  Vitals signs reviewed.     Assessment:  ANNUAL EXAM 1. Women's annual routine gynecological examination   2. Encounter for screening for other bacterial disease   3. Screening for cervical cancer   4. Morbid obesity ([place])      Screening Plan:            1.  Cervical Screening-  Pap smear done today  2. Breast screening- Exam annually and mammogram>40 planned, scheduled soon  3. Colonoscopy every 10 years, Hemoccult testing - after age 78- due next year  4. Labs managed by PCP  5. Counseling for contraception: no method   6. Encounter for screening for other bacterial disease - Cervicovaginal ancillary only    F/U  Return in about 1 year (around 07/19/2020) for Annual.  Barnett Applebaum, MD, [facility], [facility] 07/20/2019  3:13 PM

## 2019-07-23 ENCOUNTER — Inpatient Hospital Stay: Payer: Medicare HMO

## 2019-07-23 DIAGNOSIS — E43 Unspecified severe protein-calorie malnutrition: Secondary | ICD-10-CM | POA: Diagnosis not present

## 2019-07-23 DIAGNOSIS — N179 Acute kidney failure, unspecified: Secondary | ICD-10-CM | POA: Diagnosis not present

## 2019-07-23 DIAGNOSIS — T8142XA Infection following a procedure, deep incisional surgical site, initial encounter: Secondary | ICD-10-CM | POA: Diagnosis not present

## 2019-07-23 DIAGNOSIS — M793 Panniculitis, unspecified: Secondary | ICD-10-CM | POA: Diagnosis not present

## 2019-07-23 DIAGNOSIS — A318 Other mycobacterial infections: Secondary | ICD-10-CM | POA: Diagnosis not present

## 2019-07-23 DIAGNOSIS — R627 Adult failure to thrive: Secondary | ICD-10-CM | POA: Diagnosis not present

## 2019-07-24 DIAGNOSIS — M79672 Pain in left foot: Secondary | ICD-10-CM | POA: Diagnosis not present

## 2019-07-24 DIAGNOSIS — S92912D Unspecified fracture of left toe(s), subsequent encounter for fracture with routine healing: Secondary | ICD-10-CM | POA: Diagnosis not present

## 2019-07-25 ENCOUNTER — Other Ambulatory Visit: Payer: Self-pay

## 2019-07-25 LAB — CERVICOVAGINAL ANCILLARY ONLY
Bacterial Vaginitis (gardnerella): NEGATIVE
Candida Glabrata: NEGATIVE
Candida Vaginitis: NEGATIVE
Comment: NEGATIVE
Comment: NEGATIVE
Comment: NEGATIVE

## 2019-07-26 ENCOUNTER — Inpatient Hospital Stay: Payer: Medicare HMO

## 2019-07-26 VITALS — BP 130/77 | HR 87 | Temp 97.0°F | Resp 18

## 2019-07-26 DIAGNOSIS — D509 Iron deficiency anemia, unspecified: Secondary | ICD-10-CM | POA: Diagnosis not present

## 2019-07-26 LAB — CYTOLOGY - PAP
Adequacy: ABSENT
Comment: NEGATIVE
Diagnosis: NEGATIVE
High risk HPV: NEGATIVE

## 2019-07-26 MED ORDER — IRON SUCROSE 20 MG/ML IV SOLN
200.0000 mg | Freq: Once | INTRAVENOUS | Status: AC
  Administered 2019-07-26: 200 mg via INTRAVENOUS
  Filled 2019-07-26: qty 10

## 2019-07-26 MED ORDER — SODIUM CHLORIDE 0.9 % IV SOLN
Freq: Once | INTRAVENOUS | Status: AC
  Administered 2019-07-26: 15:00:00 via INTRAVENOUS
  Filled 2019-07-26: qty 250

## 2019-07-31 ENCOUNTER — Inpatient Hospital Stay: Payer: Medicare HMO

## 2019-07-31 ENCOUNTER — Other Ambulatory Visit: Payer: Self-pay

## 2019-07-31 ENCOUNTER — Other Ambulatory Visit: Payer: Self-pay | Admitting: Infectious Diseases

## 2019-08-01 ENCOUNTER — Other Ambulatory Visit: Payer: Self-pay

## 2019-08-01 ENCOUNTER — Inpatient Hospital Stay: Payer: Medicare HMO

## 2019-08-02 ENCOUNTER — Other Ambulatory Visit: Payer: Self-pay

## 2019-08-03 ENCOUNTER — Inpatient Hospital Stay: Payer: Medicare HMO

## 2019-08-03 ENCOUNTER — Other Ambulatory Visit: Payer: Self-pay

## 2019-08-03 VITALS — BP 120/74 | HR 88 | Temp 98.7°F

## 2019-08-03 DIAGNOSIS — D509 Iron deficiency anemia, unspecified: Secondary | ICD-10-CM

## 2019-08-03 MED ORDER — IRON SUCROSE 20 MG/ML IV SOLN
200.0000 mg | Freq: Once | INTRAVENOUS | Status: AC
  Administered 2019-08-03: 200 mg via INTRAVENOUS
  Filled 2019-08-03: qty 10

## 2019-08-03 MED ORDER — SODIUM CHLORIDE 0.9 % IV SOLN
Freq: Once | INTRAVENOUS | Status: AC
  Administered 2019-08-03: 14:00:00 via INTRAVENOUS
  Filled 2019-08-03: qty 250

## 2019-08-04 DIAGNOSIS — A318 Other mycobacterial infections: Secondary | ICD-10-CM | POA: Diagnosis not present

## 2019-08-04 DIAGNOSIS — T8142XA Infection following a procedure, deep incisional surgical site, initial encounter: Secondary | ICD-10-CM | POA: Diagnosis not present

## 2019-08-04 DIAGNOSIS — E43 Unspecified severe protein-calorie malnutrition: Secondary | ICD-10-CM | POA: Diagnosis not present

## 2019-08-04 DIAGNOSIS — R627 Adult failure to thrive: Secondary | ICD-10-CM | POA: Diagnosis not present

## 2019-08-04 DIAGNOSIS — M793 Panniculitis, unspecified: Secondary | ICD-10-CM | POA: Diagnosis not present

## 2019-08-04 DIAGNOSIS — N179 Acute kidney failure, unspecified: Secondary | ICD-10-CM | POA: Diagnosis not present

## 2019-08-07 ENCOUNTER — Telehealth: Payer: Self-pay | Admitting: Pharmacist

## 2019-08-07 DIAGNOSIS — M793 Panniculitis, unspecified: Secondary | ICD-10-CM | POA: Diagnosis not present

## 2019-08-07 DIAGNOSIS — E43 Unspecified severe protein-calorie malnutrition: Secondary | ICD-10-CM | POA: Diagnosis not present

## 2019-08-07 DIAGNOSIS — A318 Other mycobacterial infections: Secondary | ICD-10-CM | POA: Diagnosis not present

## 2019-08-07 DIAGNOSIS — N179 Acute kidney failure, unspecified: Secondary | ICD-10-CM | POA: Diagnosis not present

## 2019-08-07 DIAGNOSIS — R627 Adult failure to thrive: Secondary | ICD-10-CM | POA: Diagnosis not present

## 2019-08-07 DIAGNOSIS — T8142XA Infection following a procedure, deep incisional surgical site, initial encounter: Secondary | ICD-10-CM | POA: Diagnosis not present

## 2019-08-07 NOTE — Telephone Encounter (Signed)
Great, thank you!

## 2019-08-07 NOTE — Telephone Encounter (Signed)
I have 2 bottles of clofazimine in the pharmacy office for patient.  Will f/u with Dr. Delaine Lame to see if patient still needs medication.  She should not need a refill until the end of December.

## 2019-08-07 NOTE — Telephone Encounter (Signed)
She has completed treatment-thx

## 2019-08-13 ENCOUNTER — Inpatient Hospital Stay: Payer: Medicare HMO

## 2019-08-13 ENCOUNTER — Other Ambulatory Visit: Payer: Self-pay

## 2019-08-13 DIAGNOSIS — R0789 Other chest pain: Secondary | ICD-10-CM | POA: Diagnosis not present

## 2019-08-13 DIAGNOSIS — Z9884 Bariatric surgery status: Secondary | ICD-10-CM

## 2019-08-13 DIAGNOSIS — E1159 Type 2 diabetes mellitus with other circulatory complications: Secondary | ICD-10-CM | POA: Diagnosis not present

## 2019-08-13 DIAGNOSIS — R600 Localized edema: Secondary | ICD-10-CM | POA: Diagnosis not present

## 2019-08-13 DIAGNOSIS — I10 Essential (primary) hypertension: Secondary | ICD-10-CM | POA: Diagnosis not present

## 2019-08-13 DIAGNOSIS — R0602 Shortness of breath: Secondary | ICD-10-CM | POA: Diagnosis not present

## 2019-08-13 DIAGNOSIS — R002 Palpitations: Secondary | ICD-10-CM | POA: Diagnosis not present

## 2019-08-13 DIAGNOSIS — Z01810 Encounter for preprocedural cardiovascular examination: Secondary | ICD-10-CM | POA: Diagnosis not present

## 2019-08-13 DIAGNOSIS — D509 Iron deficiency anemia, unspecified: Secondary | ICD-10-CM | POA: Diagnosis not present

## 2019-08-13 LAB — IRON AND TIBC
Iron: 58 ug/dL (ref 28–170)
Saturation Ratios: 13 % (ref 10.4–31.8)
TIBC: 437 ug/dL (ref 250–450)
UIBC: 379 ug/dL

## 2019-08-13 LAB — CBC WITH DIFFERENTIAL/PLATELET
Abs Immature Granulocytes: 0.03 10*3/uL (ref 0.00–0.07)
Basophils Absolute: 0.1 10*3/uL (ref 0.0–0.1)
Basophils Relative: 1 %
Eosinophils Absolute: 0.2 10*3/uL (ref 0.0–0.5)
Eosinophils Relative: 2 %
HCT: 39.3 % (ref 36.0–46.0)
Hemoglobin: 12 g/dL (ref 12.0–15.0)
Immature Granulocytes: 0 %
Lymphocytes Relative: 22 %
Lymphs Abs: 2.1 10*3/uL (ref 0.7–4.0)
MCH: 27.1 pg (ref 26.0–34.0)
MCHC: 30.5 g/dL (ref 30.0–36.0)
MCV: 88.7 fL (ref 80.0–100.0)
Monocytes Absolute: 0.6 10*3/uL (ref 0.1–1.0)
Monocytes Relative: 6 %
Neutro Abs: 6.7 10*3/uL (ref 1.7–7.7)
Neutrophils Relative %: 69 %
Platelets: 329 10*3/uL (ref 150–400)
RBC: 4.43 MIL/uL (ref 3.87–5.11)
RDW: 15.3 % (ref 11.5–15.5)
WBC: 9.8 10*3/uL (ref 4.0–10.5)
nRBC: 0 % (ref 0.0–0.2)

## 2019-08-13 LAB — FERRITIN: Ferritin: 124 ng/mL (ref 11–307)

## 2019-08-13 LAB — VITAMIN B12: Vitamin B-12: 306 pg/mL (ref 180–914)

## 2019-08-13 LAB — FOLATE: Folate: 18.6 ng/mL (ref >5.9)

## 2019-08-13 LAB — MAGNESIUM: Magnesium: 1.8 mg/dL (ref 1.7–2.4)

## 2019-08-15 ENCOUNTER — Ambulatory Visit: Payer: Medicare HMO

## 2019-08-15 ENCOUNTER — Ambulatory Visit: Payer: Medicare HMO | Admitting: Oncology

## 2019-08-15 DIAGNOSIS — M797 Fibromyalgia: Secondary | ICD-10-CM | POA: Diagnosis not present

## 2019-08-15 DIAGNOSIS — G894 Chronic pain syndrome: Secondary | ICD-10-CM | POA: Diagnosis not present

## 2019-08-15 NOTE — Progress Notes (Signed)
Patient pre screened for office appointment, no questions or concerns today. Patient reminded of upcoming appointment time and date. 

## 2019-08-16 ENCOUNTER — Inpatient Hospital Stay: Payer: Medicare HMO

## 2019-08-16 ENCOUNTER — Encounter: Payer: Self-pay | Admitting: Pain Medicine

## 2019-08-16 ENCOUNTER — Inpatient Hospital Stay: Payer: Medicare HMO | Attending: Oncology | Admitting: Oncology

## 2019-08-16 DIAGNOSIS — N390 Urinary tract infection, site not specified: Secondary | ICD-10-CM | POA: Diagnosis not present

## 2019-08-16 DIAGNOSIS — E1140 Type 2 diabetes mellitus with diabetic neuropathy, unspecified: Secondary | ICD-10-CM | POA: Diagnosis not present

## 2019-08-16 DIAGNOSIS — Z9884 Bariatric surgery status: Secondary | ICD-10-CM

## 2019-08-16 DIAGNOSIS — D509 Iron deficiency anemia, unspecified: Secondary | ICD-10-CM | POA: Diagnosis not present

## 2019-08-16 DIAGNOSIS — E118 Type 2 diabetes mellitus with unspecified complications: Secondary | ICD-10-CM | POA: Insufficient documentation

## 2019-08-16 DIAGNOSIS — M797 Fibromyalgia: Secondary | ICD-10-CM | POA: Diagnosis not present

## 2019-08-16 DIAGNOSIS — E11628 Type 2 diabetes mellitus with other skin complications: Secondary | ICD-10-CM | POA: Diagnosis not present

## 2019-08-16 DIAGNOSIS — Z6841 Body Mass Index (BMI) 40.0 and over, adult: Secondary | ICD-10-CM | POA: Diagnosis not present

## 2019-08-17 ENCOUNTER — Encounter: Payer: Self-pay | Admitting: Oncology

## 2019-08-17 NOTE — Progress Notes (Signed)
HEMATOLOGY-ONCOLOGY TeleHEALTH VISIT PROGRESS NOTE  I connected with Karen Lewis on 08/17/19 at  9:30 AM EST by video enabled telemedicine visit and verified that I am speaking with the correct person using two identifiers. I discussed the limitations, risks, security and privacy concerns of performing an evaluation and management service by telemedicine and the availability of in-person appointments. I also discussed with the patient that there may be a patient responsible charge related to this service. The patient expressed understanding and agreed to proceed.   Other persons participating in the visit and their role in the encounter:  None  Patient's location: Home  Provider's location: office Chief Complaint: follow up for iron deficiency anemia.    INTERVAL HISTORY Karen Lewis is a 48 y.o. female who has above history reviewed by me today presents for follow up visit for management of iron deficiency anemia.  Problems and complaints are listed below:  Patient has received IV venofer treatments x 4.  Reports fatigue has improved.  She has also finished antibiotic course abdominal wall infection.   Review of Systems  Constitutional: Positive for fatigue. Negative for appetite change, chills and fever.  HENT:   Negative for hearing loss and voice change.   Eyes: Negative for eye problems.  Respiratory: Negative for chest tightness and cough.   Cardiovascular: Negative for chest pain.  Gastrointestinal: Negative for abdominal distention, abdominal pain and blood in stool.  Endocrine: Negative for hot flashes.  Genitourinary: Negative for difficulty urinating and frequency.   Musculoskeletal: Positive for arthralgias.  Skin: Negative for itching and rash.  Neurological: Negative for extremity weakness.  Hematological: Negative for adenopathy.  Psychiatric/Behavioral: Negative for confusion.    Past Medical History:  Diagnosis Date  . Anemia   . Anginal pain ([place])   . Anxiety    . Arthralgia of hip 07/29/2015  . Arthritis   . Arthritis, degenerative 07/29/2015  . Asthma   . Cephalalgia 07/25/2014  . Dependence on unknown drug ([place])    multiplt controlled drug dependence  . Depression   . Diabetes mellitus without complication ([place])   . Dysrhythmia   . Eczema   . Fibromyalgia   . Gastritis   . GERD (gastroesophageal reflux disease)   . Gonalgia 07/29/2015   Overview:  Overview:  The patient has had bilateral intra-articular Hyalgan injections done on 07/16/2014 and although she seems to do well with this type of therapy, apparently her insurance company does not want to pay for they Hyalgan. On 11/27/2014 the patient underwent a bilateral genicular nerve block with excellent results. On 01/28/2015 she had a right knee genicular radiofrequency ablatio  . Gout   . H/O cardiovascular disorder 03/10/2015  . H/O surgical procedure 12/05/2012   Overview:  LSG ([facility] - April 2013)    . H/O thyroid disease 03/10/2015  . Headache   . Herpes   . History of artificial joint 07/29/2015  . History of hiatal hernia   . Hypertension   . Hypomagnesemia   . Hypothyroidism   . IDA (iron deficiency anemia) 05/28/2019  . LBP (low back pain) 07/29/2015  . Neuromuscular disorder ([place])   . Obesity   . PCOS (polycystic ovarian syndrome)   . Primary osteoarthritis of both knees 07/29/2015  . Sleep apnea   . Thyroid nodule bilateral   Past Surgical History:  Procedure Laterality Date  . APPENDECTOMY    . CARPAL TUNNEL RELEASE Bilateral   . CHOLECYSTECTOMY    . DIAGNOSTIC LAPAROSCOPY    .  HIATAL HERNIA REPAIR    . JOINT REPLACEMENT Left hip  . LAPAROSCOPIC PARTIAL GASTRECTOMY    . left trigger finger    . peniculectomy N/A 07/05/2018  . ROUX-EN-Y GASTRIC BYPASS  06/03/2017  . SHOULDER ARTHROSCOPY Right   . THYROIDECTOMY N/A 11/12/2015   Procedure: THYROIDECTOMY;  Surgeon: Clyde Canterbury, MD;  Location: [facility] ORS;  Service: ENT;  Laterality: N/A;  . TRIGGER FINGER RELEASE  Right     Family History  Problem Relation Age of Onset  . Anxiety disorder Mother   . Depression Mother   . Alcohol abuse Mother   . Diabetes Mother   . Hypertension Mother   . Kidney cancer Mother   . Sleep apnea Mother   . Alcohol abuse Father   . Anxiety disorder Father   . Depression Father   . Post-traumatic stress disorder Father   . Kidney failure Father   . COPD Father   . Diabetes Father   . Hypertension Father   . Sleep apnea Father   . Depression Brother   . Diabetes Brother   . Hypertension Brother   . Sleep apnea Brother   . Breast cancer Paternal Aunt   . Bladder Cancer Neg Hx   . Prostate cancer Neg Hx     Social History   Socioeconomic History  . Marital status: Significant Other    Spouse name: Not on file  . Number of children: Not on file  . Years of education: Not on file  . Highest education level: Not on file  Occupational History  . Not on file  Social Needs  . Financial resource strain: Not on file  . Food insecurity    Worry: Not on file    Inability: Not on file  . Transportation needs    Medical: Not on file    Non-medical: Not on file  Tobacco Use  . Smoking status: Never Smoker  . Smokeless tobacco: Never Used  Substance and Sexual Activity  . Alcohol use: No    Alcohol/week: 0.0 standard drinks  . Drug use: No  . Sexual activity: Not Currently    Birth control/protection: Injection  Lifestyle  . Physical activity    Days per week: Not on file    Minutes per session: Not on file  . Stress: Not on file  Relationships  . Social Herbalist on phone: Not on file    Gets together: Not on file    Attends religious service: Not on file    Active member of club or organization: Not on file    Attends meetings of clubs or organizations: Not on file    Relationship status: Not on file  . Intimate partner violence    Fear of current or ex partner: Not on file    Emotionally abused: Not on file    Physically abused:  Not on file    Forced sexual activity: Not on file  Other Topics Concern  . Not on file  Social History Narrative  . Not on file    Current Outpatient Medications on File Prior to Visit  Medication Sig Dispense Refill  . ACCU-CHEK AVIVA PLUS test strip     . ACETAMINOPHEN PO Take 650 mg by mouth every 6 (six) hours as needed.    Marland Kitchen acyclovir (ZOVIRAX) 800 MG tablet Take 800 mg by mouth as needed. Reported on 11/12/2015    . albuterol (PROVENTIL HFA;VENTOLIN HFA) 108 (90 BASE) MCG/ACT inhaler Inhale 2 puffs  into the lungs every 6 (six) hours as needed for wheezing or shortness of breath.    Marland Kitchen amitriptyline (ELAVIL) 100 MG tablet 100 mg at bedtime.   3  . Biotin 10 MG CAPS     . calcium carbonate (TUMS - DOSED IN MG ELEMENTAL CALCIUM) 500 MG chewable tablet Chew 1 tablet by mouth daily.    . clotrimazole (MYCELEX) 10 MG troche Take 10 mg by mouth as needed.  3  . diclofenac sodium (VOLTAREN) 1 % GEL Apply 2 g topically 4 (four) times daily. 100 g 1  . diphenhydrAMINE (BENADRYL) 25 mg capsule Take 25 mg by mouth as needed.    . Emollient (A + D PERSONAL CARE LOTION EX) Apply 1 application topically as needed.    Marland Kitchen EPINEPHrine 0.3 mg/0.3 mL IJ SOAJ injection Inject 0.3 mg into the skin as needed.    Marland Kitchen ketoconazole (NIZORAL) 2 % cream APPLY AS DIRECTED TO AFFECTED AREA TWICE DAILY AS NEEDED    . levothyroxine (SYNTHROID) 125 MCG tablet Take 250 mcg by mouth.     Marland Kitchen lisinopril (ZESTRIL) 5 MG tablet Take by mouth.    . magnesium oxide (MAG-OX) 400 MG tablet Take 1 tablet (400 mg total) by mouth 2 (two) times daily. (Patient taking differently: Take 400 mg by mouth 2 (two) times daily. 3 tabs in a.m. 2 tabs at bedtime) 180 tablet 0  . metFORMIN (GLUCOPHAGE) 1000 MG tablet Take 1,000 mg by mouth 2 (two) times daily with a meal.     . metoprolol tartrate (LOPRESSOR) 50 MG tablet Take 75 mg by mouth 2 (two) times daily.     . montelukast (SINGULAIR) 10 MG tablet Take 10 mg by mouth at bedtime.     .  Multiple Vitamin (MULTI-VITAMIN) tablet Take 2 tablets by mouth daily.    . naloxone (NARCAN) nasal spray 4 mg/0.1 mL Spray into one nostril. Repeat with second device into other nostril after 2-3 minutes if no or minimal response. 1 kit 0  . nystatin (MYCOSTATIN) 100000 UNIT/ML suspension Swish and swallow 2 mLs 4 (four) times daily    . omeprazole (PRILOSEC) 40 MG capsule Take 40 mg by mouth 2 (two) times daily.     . ondansetron (ZOFRAN-ODT) 4 MG disintegrating tablet Take 4 mg by mouth as needed.  0  . oxyCODONE (OXY IR/ROXICODONE) 5 MG immediate release tablet Take 1 tablet (5 mg total) by mouth 5 (five) times daily. Must last 30 days 150 tablet 0  . pregabalin (LYRICA) 225 MG capsule Take 1 capsule (225 mg total) by mouth 2 (two) times daily. 180 capsule 0  . QUEtiapine (SEROQUEL) 100 MG tablet Take 100 mg by mouth at bedtime.    . rosuvastatin (CRESTOR) 40 MG tablet     . sertraline (ZOLOFT) 100 MG tablet Take 200 mg by mouth daily.     Marland Kitchen spironolactone (ALDACTONE) 25 MG tablet Take 37.5 mg by mouth 2 (two) times daily.     Marland Kitchen tiZANidine (ZANAFLEX) 4 MG tablet Take 4 mg by mouth 2 (two) times daily as needed.     . triamcinolone (NASACORT AQ) 55 MCG/ACT AERO nasal inhaler 2 sprays by Nasal route 2 (two) times daily.    . vitamin C (ASCORBIC ACID) 500 MG tablet Take 500 mg by mouth daily.    . Vitamin D, Ergocalciferol, (DRISDOL) 1.25 MG (50000 UT) CAPS capsule 2 (two) times a week.     . zolpidem (AMBIEN) 10 MG tablet Take 10 mg  by mouth at bedtime as needed for sleep.    Marland Kitchen zonisamide (ZONEGRAN) 50 MG capsule Take 150 mg by mouth daily. 3 tabs at bedtime    . oxyCODONE (OXY IR/ROXICODONE) 5 MG immediate release tablet Take 1 tablet (5 mg total) by mouth 5 (five) times daily. Must last 30 days (Patient not taking: Reported on 08/15/2019) 150 tablet 0   No current facility-administered medications on file prior to visit.     Allergies  Allergen Reactions  . Bactrim  [Sulfamethoxazole-Trimethoprim] Hives  . Omalizumab Itching and Hives  . Ciprofloxacin Other (See Comments)    Muscle Pain Possible myalgias. myalgia Possible myalgias. myalgia  . Shellfish Allergy Other (See Comments)    unknown  . Trimethoprim Hives  . Aspirin Other (See Comments)    GI UPSET  . Clindamycin Rash  . Motrin [Ibuprofen]     Other reaction(s): Other (See Comments) GI UPSET  . Nsaids Nausea Only and Other (See Comments)    Stomach upset Other reaction(s): Other (See Comments) Stomach upset  . Other Hives  . Sulfa Antibiotics Hives       Observations/Objective: There were no vitals filed for this visit. There is no height or weight on file to calculate BMI.  Physical Exam  Constitutional: She is oriented to person, place, and time. No distress.  Neurological: She is alert and oriented to person, place, and time.  Psychiatric: Affect normal.    CBC    Component Value Date/Time   WBC 9.8 08/13/2019 1417   RBC 4.43 08/13/2019 1417   HGB 12.0 08/13/2019 1417   HGB 12.1 12/13/2013 1628   HCT 39.3 08/13/2019 1417   HCT 38.4 12/13/2013 1628   PLT 329 08/13/2019 1417   PLT 364 12/13/2013 1628   MCV 88.7 08/13/2019 1417   MCV 82 12/13/2013 1628   MCH 27.1 08/13/2019 1417   MCHC 30.5 08/13/2019 1417   RDW 15.3 08/13/2019 1417   RDW 16.9 (H) 12/13/2013 1628   LYMPHSABS 2.1 08/13/2019 1417   LYMPHSABS 3.2 04/10/2013 1836   MONOABS 0.6 08/13/2019 1417   MONOABS 0.7 04/10/2013 1836   EOSABS 0.2 08/13/2019 1417   EOSABS 0.3 04/10/2013 1836   BASOSABS 0.1 08/13/2019 1417   BASOSABS 0.1 04/10/2013 1836    CMP     Component Value Date/Time   NA 139 07/05/2019 1605   NA 139 06/17/2014 1811   K 4.8 07/05/2019 1605   K 3.6 06/17/2014 1811   CL 106 07/05/2019 1605   CL 111 (H) 06/17/2014 1811   CO2 22 07/05/2019 1605   CO2 22 06/17/2014 1811   GLUCOSE 112 (H) 07/05/2019 1605   GLUCOSE 99 06/17/2014 1811   BUN 18 07/05/2019 1605   BUN 7 06/17/2014 1811    CREATININE 0.92 07/05/2019 1605   CREATININE 0.98 06/17/2014 1811   CALCIUM 8.5 (L) 07/05/2019 1605   CALCIUM 8.5 06/17/2014 1811   PROT 6.8 07/05/2019 1605   PROT 7.4 06/17/2014 1811   ALBUMIN 3.5 07/05/2019 1605   ALBUMIN 3.5 06/17/2014 1811   AST 198 (H) 07/05/2019 1605   AST 23 06/17/2014 1811   ALT 140 (H) 07/05/2019 1605   ALT 22 06/17/2014 1811   ALKPHOS 117 07/05/2019 1605   ALKPHOS 90 06/17/2014 1811   BILITOT 0.6 07/05/2019 1605   BILITOT 0.2 06/17/2014 1811   GFRNONAA >60 07/05/2019 1605   GFRNONAA >60 06/17/2014 1811   GFRNONAA >60 12/13/2013 1628   GFRAA >60 07/05/2019 1605   GFRAA >60  06/17/2014 1811   GFRAA >60 12/13/2013 1628     Assessment and Plan: 1. Iron deficiency anemia, unspecified iron deficiency anemia type   2. Hypomagnesemia   3. History of gastric bypass     CBC showed stable hemoglobin. Iron panel showed improved iron store.  Hold additional IV iron treatment. Patient informs me that she may have potential hip surgery by Dr.Menz and is getting cardiology clearance.  From hematology aspect, her iron deficiency has improved and hemoglobin has optimized and she can proceed with surgery if deemed necessary.   Hypomagnesia,has resolved.  History of gastric bypass, continue Vitamin B12 and Folate level.    Follow Up Instructions: 4 months    I discussed the assessment and treatment plan with the patient. The patient was provided an opportunity to ask questions and all were answered. The patient agreed with the plan and demonstrated an understanding of the instructions.  The patient was advised to call back or seek an in-person evaluation if the symptoms worsen or if the condition fails to improve as anticipated.   Earlie Server, MD 08/17/2019 11:11 PM

## 2019-08-20 ENCOUNTER — Ambulatory Visit (INDEPENDENT_AMBULATORY_CARE_PROVIDER_SITE_OTHER): Payer: Medicare HMO | Admitting: Vascular Surgery

## 2019-08-20 ENCOUNTER — Ambulatory Visit: Payer: Medicare HMO | Attending: Pain Medicine | Admitting: Pain Medicine

## 2019-08-20 ENCOUNTER — Other Ambulatory Visit: Payer: Self-pay

## 2019-08-20 ENCOUNTER — Encounter (INDEPENDENT_AMBULATORY_CARE_PROVIDER_SITE_OTHER): Payer: Self-pay | Admitting: Vascular Surgery

## 2019-08-20 VITALS — BP 136/77 | HR 94 | Resp 16 | Wt 367.0 lb

## 2019-08-20 DIAGNOSIS — G894 Chronic pain syndrome: Secondary | ICD-10-CM

## 2019-08-20 DIAGNOSIS — F1190 Opioid use, unspecified, uncomplicated: Secondary | ICD-10-CM

## 2019-08-20 DIAGNOSIS — M792 Neuralgia and neuritis, unspecified: Secondary | ICD-10-CM

## 2019-08-20 DIAGNOSIS — M25561 Pain in right knee: Secondary | ICD-10-CM

## 2019-08-20 DIAGNOSIS — M797 Fibromyalgia: Secondary | ICD-10-CM | POA: Diagnosis not present

## 2019-08-20 DIAGNOSIS — Z79891 Long term (current) use of opiate analgesic: Secondary | ICD-10-CM | POA: Diagnosis not present

## 2019-08-20 DIAGNOSIS — I890 Lymphedema, not elsewhere classified: Secondary | ICD-10-CM

## 2019-08-20 DIAGNOSIS — E1159 Type 2 diabetes mellitus with other circulatory complications: Secondary | ICD-10-CM | POA: Diagnosis not present

## 2019-08-20 DIAGNOSIS — E118 Type 2 diabetes mellitus with unspecified complications: Secondary | ICD-10-CM | POA: Diagnosis not present

## 2019-08-20 DIAGNOSIS — I152 Hypertension secondary to endocrine disorders: Secondary | ICD-10-CM

## 2019-08-20 DIAGNOSIS — I10 Essential (primary) hypertension: Secondary | ICD-10-CM

## 2019-08-20 DIAGNOSIS — J45909 Unspecified asthma, uncomplicated: Secondary | ICD-10-CM

## 2019-08-20 DIAGNOSIS — M25551 Pain in right hip: Secondary | ICD-10-CM | POA: Diagnosis not present

## 2019-08-20 DIAGNOSIS — M25562 Pain in left knee: Secondary | ICD-10-CM

## 2019-08-20 DIAGNOSIS — M545 Low back pain: Secondary | ICD-10-CM

## 2019-08-20 DIAGNOSIS — G8929 Other chronic pain: Secondary | ICD-10-CM

## 2019-08-20 MED ORDER — MAGNESIUM OXIDE 400 MG PO TABS: 400.0000 mg | ORAL_TABLET | Freq: Two times a day (BID) | ORAL | 5 refills | Status: DC

## 2019-08-20 MED ORDER — OXYCODONE HCL 5 MG PO TABS: 5.0000 mg | ORAL_TABLET | Freq: Every day | ORAL | 0 refills | Status: DC

## 2019-08-20 MED ORDER — PREGABALIN 225 MG PO CAPS: 225.0000 mg | ORAL_CAPSULE | Freq: Two times a day (BID) | ORAL | 5 refills | Status: DC

## 2019-08-20 NOTE — Progress Notes (Signed)
MRN : [id number]  Karen Lewis is a 48 y.o. (09/06/1971) female who presents with chief complaint of No chief complaint on file. Marland Kitchen  History of Present Illness:   The patient returns to the office for followup evaluation regarding leg swelling.  The swelling has persisted and the pain associated with swelling continues. There have not been any interval development of a ulcerations or wounds.  Since the previous visit the patient has been wearing graduated compression stockings and has noted little if any improvement in the lymphedema. The patient has been using compression routinely morning until night.  The patient also states elevation during the day and exercise is being done too.   No outpatient medications have been marked as taking for the 08/20/19 encounter (Appointment) with Delana Meyer, Dolores Lory, MD.    Past Medical History:  Diagnosis Date  . Anemia   . Anginal pain ([place])   . Anxiety   . Arthralgia of hip 07/29/2015  . Arthritis   . Arthritis, degenerative 07/29/2015  . Asthma   . Cephalalgia 07/25/2014  . Dependence on unknown drug ([place])    multiplt controlled drug dependence  . Depression   . Diabetes mellitus without complication ([place])   . Dysrhythmia   . Eczema   . Fibromyalgia   . Gastritis   . GERD (gastroesophageal reflux disease)   . Gonalgia 07/29/2015   Overview:  Overview:  The patient has had bilateral intra-articular Hyalgan injections done on 07/16/2014 and although she seems to do well with this type of therapy, apparently her insurance company does not want to pay for they Hyalgan. On 11/27/2014 the patient underwent a bilateral genicular nerve block with excellent results. On 01/28/2015 she had a right knee genicular radiofrequency ablatio  . Gout   . H/O cardiovascular disorder 03/10/2015  . H/O surgical procedure 12/05/2012   Overview:  LSG ([facility] - April 2013)    . H/O thyroid disease 03/10/2015  . Headache   . Herpes   . History of artificial  joint 07/29/2015  . History of hiatal hernia   . Hypertension   . Hypomagnesemia   . Hypothyroidism   . IDA (iron deficiency anemia) 05/28/2019  . LBP (low back pain) 07/29/2015  . Neuromuscular disorder ([place])   . Obesity   . PCOS (polycystic ovarian syndrome)   . Primary osteoarthritis of both knees 07/29/2015  . Sleep apnea   . Thyroid nodule bilateral    Past Surgical History:  Procedure Laterality Date  . APPENDECTOMY    . CARPAL TUNNEL RELEASE Bilateral   . CHOLECYSTECTOMY    . DIAGNOSTIC LAPAROSCOPY    . HIATAL HERNIA REPAIR    . JOINT REPLACEMENT Left hip  . LAPAROSCOPIC PARTIAL GASTRECTOMY    . left trigger finger    . peniculectomy N/A 07/05/2018  . ROUX-EN-Y GASTRIC BYPASS  06/03/2017  . SHOULDER ARTHROSCOPY Right   . THYROIDECTOMY N/A 11/12/2015   Procedure: THYROIDECTOMY;  Surgeon: Clyde Canterbury, MD;  Location: [facility] ORS;  Service: ENT;  Laterality: N/A;  . TRIGGER FINGER RELEASE Right     Social History Social History   Tobacco Use  . Smoking status: Never Smoker  . Smokeless tobacco: Never Used  Substance Use Topics  . Alcohol use: No    Alcohol/week: 0.0 standard drinks  . Drug use: No    Family History Family History  Problem Relation Age of Onset  . Anxiety disorder Mother   . Depression Mother   . Alcohol abuse Mother   .  Diabetes Mother   . Hypertension Mother   . Kidney cancer Mother   . Sleep apnea Mother   . Alcohol abuse Father   . Anxiety disorder Father   . Depression Father   . Post-traumatic stress disorder Father   . Kidney failure Father   . COPD Father   . Diabetes Father   . Hypertension Father   . Sleep apnea Father   . Depression Brother   . Diabetes Brother   . Hypertension Brother   . Sleep apnea Brother   . Breast cancer Paternal Aunt   . Bladder Cancer Neg Hx   . Prostate cancer Neg Hx     Allergies  Allergen Reactions  . Bactrim [Sulfamethoxazole-Trimethoprim] Hives  . Omalizumab Itching and Hives  .  Ciprofloxacin Other (See Comments)    Muscle Pain Possible myalgias. myalgia Possible myalgias. myalgia  . Shellfish Allergy Other (See Comments)    unknown  . Trimethoprim Hives  . Aspirin Other (See Comments)    GI UPSET  . Clindamycin Rash  . Motrin [Ibuprofen]     Other reaction(s): Other (See Comments) GI UPSET  . Nsaids Nausea Only and Other (See Comments)    Stomach upset Other reaction(s): Other (See Comments) Stomach upset  . Other Hives  . Sulfa Antibiotics Hives     REVIEW OF SYSTEMS (Negative unless checked)  Constitutional: [] Weight loss  [] Fever  [] Chills Cardiac: [] Chest pain   [] Chest pressure   [] Palpitations   [] Shortness of breath when laying flat   [] Shortness of breath with exertion. Vascular:  [] Pain in legs with walking   [] Pain in legs at rest  [] History of DVT   [] Phlebitis   [x] Swelling in legs   [] Varicose veins   [] Non-healing ulcers Pulmonary:   [] Uses home oxygen   [] Productive cough   [] Hemoptysis   [] Wheeze  [] COPD   [x] Asthma Neurologic:  [] Dizziness   [] Seizures   [] History of stroke   [] History of TIA  [] Aphasia   [] Vissual changes   [] Weakness or numbness in arm   [] Weakness or numbness in leg Musculoskeletal:   [] Joint swelling   [] Joint pain   [] Low back pain Hematologic:  [] Easy bruising  [] Easy bleeding   [] Hypercoagulable state   [] Anemic Gastrointestinal:  [] Diarrhea   [] Vomiting  [x] Gastroesophageal reflux/heartburn   [] Difficulty swallowing. Genitourinary:  [] Chronic kidney disease   [] Difficult urination  [] Frequent urination   [] Blood in urine Skin:  [] Rashes   [] Ulcers  Psychological:  [] History of anxiety   []  History of major depression.  Physical Examination  There were no vitals filed for this visit. There is no height or weight on file to calculate BMI. Gen: WD/WN, NAD morbidly obese using a scooter Head: [place]/AT, No temporalis wasting.  Ear/Nose/Throat: Hearing grossly intact, nares w/o erythema or drainage Eyes: PER,  EOMI, sclera nonicteric.  Neck: Supple, no large masses.   Pulmonary:  Good air movement, no audible wheezing bilaterally, no use of accessory muscles.  Cardiac: RRR, no JVD Vascular: scattered varicosities present bilaterally.  Mild venous stasis changes to the legs bilaterally. 3-4+ soft pitting edema extending up to the hip Gastrointestinal: Non-distended. No guarding/no peritoneal signs.  Musculoskeletal: M/S 5/5 throughout.  No deformity or atrophy.  Neurologic: CN 2-12 intact. Symmetrical.  Speech is fluent. Motor exam as listed above. Psychiatric: Judgment intact, Mood & affect appropriate for pt's clinical situation. Dermatologic: No rashes or ulcers noted.  No changes consistent with cellulitis. Lymph : No lichenification or skin changes of chronic  lymphedema.  CBC Lab Results  Component Value Date   WBC 9.8 08/13/2019   HGB 12.0 08/13/2019   HCT 39.3 08/13/2019   MCV 88.7 08/13/2019   PLT 329 08/13/2019    BMET    Component Value Date/Time   NA 139 07/05/2019 1605   NA 139 06/17/2014 1811   K 4.8 07/05/2019 1605   K 3.6 06/17/2014 1811   CL 106 07/05/2019 1605   CL 111 (H) 06/17/2014 1811   CO2 22 07/05/2019 1605   CO2 22 06/17/2014 1811   GLUCOSE 112 (H) 07/05/2019 1605   GLUCOSE 99 06/17/2014 1811   BUN 18 07/05/2019 1605   BUN 7 06/17/2014 1811   CREATININE 0.92 07/05/2019 1605   CREATININE 0.98 06/17/2014 1811   CALCIUM 8.5 (L) 07/05/2019 1605   CALCIUM 8.5 06/17/2014 1811   GFRNONAA >60 07/05/2019 1605   GFRNONAA >60 06/17/2014 1811   GFRNONAA >60 12/13/2013 1628   GFRAA >60 07/05/2019 1605   GFRAA >60 06/17/2014 1811   GFRAA >60 12/13/2013 1628   CrCl cannot be calculated (Patient's most recent lab result is older than the maximum 21 days allowed.).  COAG Lab Results  Component Value Date   INR 0.95 05/07/2015   INR 1.0 03/12/2013    Radiology No results found.   Assessment/Plan 1. Lymphedema Recommend:  No surgery or intervention at  this point in time.    I have reviewed my previous discussion with the patient regarding swelling and why it causes symptoms.  Patient will continue wearing graduated compression wraps class 1 (20-30 mmHg) on a daily basis. I have asked that she wear wraps the extend upto the hip or high thigh.  The patient will  beginning wearing the stockings first thing in the morning and removing them in the evening. The patient is instructed specifically not to sleep in the stockings.    In addition, behavioral modification including several periods of elevation of the lower extremities during the day will be continued.  This was reviewed with the patient during the initial visit.  The patient will also continue routine exercise, especially walking on a daily basis as was discussed during the initial visit.    Despite conservative treatments including graduated compression therapy class 1 and behavioral modification including exercise and elevation the patient  has not obtained adequate control of the lymphedema.  The patient still has stage 3 lymphedema and therefore, I believe that a lymph pump should be added to improve the control of the patient's lymphedema.  Additionally, a lymph pump is warranted because it will reduce the risk of cellulitis and ulceration in the future.  Patient should follow-up in six months   2. Hypertension associated with diabetes ([place]) Continue antihypertensive medications as already ordered, these medications have been reviewed and there are no changes at this time.   3. Type II diabetes mellitus with complication (HCC) Continue hypoglycemic medications as already ordered, these medications have been reviewed and there are no changes at this time.  Hgb A1C to be monitored as already arranged by primary service   4. Moderate asthma, unspecified whether complicated, unspecified whether persistent Continue pulmonary medications and aerosols as already ordered, these medications  have been reviewed and there are no changes at this time.      Hortencia Pilar, MD  08/20/2019 1:30 PM

## 2019-08-20 NOTE — Progress Notes (Signed)
Pain Management Virtual Encounter Note - Virtual Visit via Telephone Telehealth (real-time audio visits between healthcare provider and patient).   Patient's Phone No. & Preferred Pharmacy:  [phone] (home); [phone] (mobile); (Preferred) [phone] [email or web address]  [place], [place] - [address] [place] [address] Phone: [phone] Fax: [phone]    Pre-screening note:  Our staff contacted Karen Lewis and offered her an "in person", "face-to-face" appointment versus a telephone encounter. She indicated preferring the telephone encounter, at this time.   Reason for Virtual Visit: COVID-19*  Social distancing based on CDC and AMA recommendations.   I contacted Karen Lewis on 08/20/2019 via telephone.      I clearly identified myself as Gaspar Cola, MD. I verified that I was speaking with the correct person using two identifiers (Name: Karen Lewis, and date of birth: 1971/03/28).  Advanced Informed Consent I sought verbal advanced consent from Karen Lewis for virtual visit interactions. I informed Karen Lewis of possible security and privacy concerns, risks, and limitations associated with providing "not-in-person" medical evaluation and management services. I also informed Karen Lewis of the availability of "in-person" appointments. Finally, I informed her that there would be a charge for the virtual visit and that she could be  personally, fully or partially, financially responsible for it. Karen Lewis expressed understanding and agreed to proceed.   Historic Elements   Karen Lewis is a 48 y.o. year old, female patient evaluated today after her last encounter by our practice on 05/22/2019. Karen Lewis  has a past medical history of Anemia, Anginal pain ([place]), Anxiety, Arthralgia of hip (07/29/2015), Arthritis, Arthritis, degenerative (07/29/2015), Asthma, Cephalalgia (07/25/2014), Dependence on unknown drug ([place]), Depression,  Diabetes mellitus without complication ([place]), Dysrhythmia, Eczema, Fibromyalgia, Gastritis, GERD (gastroesophageal reflux disease), Gonalgia (07/29/2015), Gout, H/O cardiovascular disorder (03/10/2015), H/O surgical procedure (12/05/2012), H/O thyroid disease (03/10/2015), Headache, Herpes, History of artificial joint (07/29/2015), History of hiatal hernia, Hypertension, Hypomagnesemia, Hypothyroidism, IDA (iron deficiency anemia) (05/28/2019), LBP (low back pain) (07/29/2015), Neuromuscular disorder ([place]), Obesity, PCOS (polycystic ovarian syndrome), Primary osteoarthritis of both knees (07/29/2015), Sleep apnea, and Thyroid nodule (bilateral). She also  has a past surgical history that includes Laparoscopic partial gastrectomy; Shoulder arthroscopy (Right); Joint replacement (Left, hip); Carpal tunnel release (Bilateral); Diagnostic laparoscopy; Cholecystectomy; Trigger finger release (Right); Thyroidectomy (N/A, 11/12/2015); left trigger finger; Roux-en-Y Gastric Bypass (06/03/2017); Hiatal hernia repair; peniculectomy (N/A, 07/05/2018); and Appendectomy. Karen Lewis has a current medication list which includes the following prescription(s): accu-chek aviva plus, acetaminophen, acyclovir, albuterol, amitriptyline, biotin, calcium carbonate, diclofenac sodium, diphenhydramine, emollient, epinephrine, famotidine, ketoconazole, levothyroxine, lisinopril, magnesium oxide, metformin, metoprolol tartrate, montelukast, multi-vitamin, naloxone, nystatin, oxycodone, oxycodone, oxycodone, pregabalin, quetiapine, rosuvastatin, sertraline, spironolactone, tizanidine, triamcinolone, vitamin c, vitamin d (ergocalciferol), zolpidem, and zonisamide. She  reports that she has never smoked. She has never used smokeless tobacco. She reports that she does not drink alcohol or use drugs. Karen Lewis is allergic to bactrim [sulfamethoxazole-trimethoprim]; omalizumab; ciprofloxacin; shellfish allergy; trimethoprim; aspirin; clindamycin; motrin  [ibuprofen]; nsaids; other; and sulfa antibiotics.   HPI  Today, she is being contacted for medication management.  The patient indicates doing well with the current medication regimen. No adverse reactions or side effects reported to the medications.   Pharmacotherapy Assessment  Analgesic: Oxycodone IR 5 mg 1 tab PO 5X/day (#150/mo) (25 mg/day) MME/day:37.5 mg/day.   Monitoring: Pharmacotherapy: No side-effects or adverse reactions reported. [place] PMP: PDMP reviewed during this encounter.  Compliance: No problems identified. Effectiveness: Clinically acceptable. Plan: Refer to "POC".  UDS:  Summary  Date Value Ref Range Status  08/23/2018 FINAL  Final    Comment:    ==================================================================== TOXASSURE SELECT 13 (MW) ==================================================================== Test                             Result       Flag       Units Drug Present and Declared for Prescription Verification   Oxycodone                      260          EXPECTED   ng/mg creat   Oxymorphone                    52           EXPECTED   ng/mg creat   Noroxycodone                   991          EXPECTED   ng/mg creat    Sources of oxycodone include scheduled prescription medications.    Oxymorphone and noroxycodone are expected metabolites of    oxycodone. Oxymorphone is also available as a scheduled    prescription medication. Drug Absent but Declared for Prescription Verification   Codeine                        Not Detected UNEXPECTED ng/mg creat   Butalbital                     Not Detected UNEXPECTED ==================================================================== Test                      Result    Flag   Units      Ref Range   Creatinine              203              mg/dL      >=20 ==================================================================== Declared Medications:  The flagging and interpretation on this report are based on  the  following declared medications.  Unexpected results may arise from  inaccuracies in the declared medications.  **Note: The testing scope of this panel includes these medications:  Butalbital (Butalbital/APAP/Caffeine)  Codeine  Oxycodone  **Note: The testing scope of this panel does not include following  reported medications:  Acetaminophen  Acetaminophen (Butalbital/APAP/Caffeine)  Acyclovir  Albuterol  Albuterol (Ipratropium-Albuterol)  Amitriptyline  Caffeine (Butalbital/APAP/Caffeine)  Clotrimazole  Cyanocobalamin  Diphenhydramine  Epinephrine  Estradiol  Famotidine  Fluconazole  Gabapentin  Guaifenesin  Ipratropium (Ipratropium-Albuterol)  Iron (Ferrous Sulfate)  Ketoconazole  Ketorolac  Levocetirizine  Levofloxacin  Levothyroxine  Magnesium  Magnesium Oxide  Meclizine  Medroxyprogesterone (Depo-Provera)  Metformin  Metoprolol  Montelukast  Naloxone  Nitrofurantoin (Macrobid)  Omeprazole  Ondansetron  Phentermine  Pioglitazone  Quetiapine  Ranitidine  Rosuvastatin  Semaglutide (Ozempic)  Sertraline  Spironolactone  Tizanidine  Triamcinolone  Vitamin B (Biotin)  Vitamin C  Vitamin D2 (Ergocalciferol)  Zolpidem  Zonisamide (Zonegran) ==================================================================== For clinical consultation, please call [phone]. ====================================================================    Laboratory Chemistry Profile (12 mo)  Renal: 07/05/2019: BUN 18; Creatinine, Ser 0.92  Lab Results  Component Value Date   GFRAA >60 07/05/2019   GFRNONAA >60 07/05/2019  Hepatic: 07/05/2019: Albumin 3.5 Lab Results  Component Value Date   AST 198 (H) 07/05/2019   ALT 140 (H) 07/05/2019   Other: 02/23/2019: CRP 0.9; Sed Rate 73 08/13/2019: Vitamin B-12 306 Note: Above Lab results reviewed.  Imaging  US Venous Img Lower Unilateral Right CLINICAL DATA:  48 year old female with RIGHT LOWER extremity  pain and swelling for 3 weeks.  EXAM: RIGHT LOWER EXTREMITY VENOUS DOPPLER ULTRASOUND  TECHNIQUE: Gray-scale sonography with graded compression, as well as color Doppler and duplex ultrasound were performed to evaluate the lower extremity deep venous systems from the level of the common femoral vein and including the common femoral, femoral, profunda femoral, popliteal and calf veins including the posterior tibial, peroneal and gastrocnemius veins when visible. The superficial great saphenous vein was also interrogated. Spectral Doppler was utilized to evaluate flow at rest and with distal augmentation maneuvers in the common femoral, femoral and popliteal veins.  COMPARISON:  None.  FINDINGS: Contralateral LEFT common Femoral Vein: Respiratory phasicity is normal and symmetric with the symptomatic side. No evidence of thrombus. Normal compressibility.  Normal flow, compressibility, and augmentation within the RIGHT distal common femoral, proximal profunda femoral, proximal greater saphenous, entire femoral, popliteal veins, and imaged calf veins.  IMPRESSION: No evidence of RIGHT deep venous thrombosis.  Electronically Signed   By: Margarette Canada M.D.   On: 05/29/2019 18:56 DG Chest 2 View CLINICAL DATA:  Fall.  EXAM: CHEST - 2 VIEW  COMPARISON:  02/03/2018 and prior radiographs.  FINDINGS: The cardiomediastinal silhouette is unremarkable.  A LEFT PICC line is noted with tip difficult to visualize but appears to overlie the UPPER RIGHT atrium.  There is no evidence of focal airspace disease, pulmonary edema, suspicious pulmonary nodule/mass, pleural effusion, or pneumothorax.  No acute bony abnormalities are identified.  IMPRESSION: No active cardiopulmonary disease.  Electronically Signed   By: Margarette Canada M.D.   On: 05/29/2019 17:43 CT ABDOMEN PELVIS W CONTRAST CLINICAL DATA:  Abdominal pain and distension  EXAM: CT ABDOMEN AND PELVIS WITH  CONTRAST  TECHNIQUE: Multidetector CT imaging of the abdomen and pelvis was performed using the standard protocol following bolus administration of intravenous contrast.  CONTRAST:  157mL OMNIPAQUE IOHEXOL 300 MG/ML  SOLN  COMPARISON:  01/10/2019 CT  FINDINGS: Lower chest: Lung bases demonstrate no acute consolidation or effusion. The heart size is within normal limits.  Hepatobiliary: No focal liver abnormality is seen. Status post cholecystectomy. No biliary dilatation.  Pancreas: Unremarkable. No pancreatic ductal dilatation or surrounding inflammatory changes.  Spleen: Normal in size without focal abnormality.  Adrenals/Urinary Tract: Adrenal glands are normal. No hydronephrosis. Punctate stones in the lower pole left kidney. Bladder is normal  Stomach/Bowel: Status post gastric bypass. No dilated small bowel. No colon wall thickening. Negative appendix.  Vascular/Lymphatic: Mild aortic atherosclerosis. No aneurysm. IVC filter with apex below the level of the renal veins. No significantly enlarged lymph node  .  Reproductive: Uterus and bilateral adnexa are unremarkable. Uterus is somewhat atrophic in appearance.  Other: Negative for free air or free fluid. Soft tissue thickening at the umbilicus with fat containing umbilical hernia. Moderate generalized edema and soft tissue stranding within the subcutaneous fat. Resolved left abdominal fluid collections with residual soft tissue thickening and calcifications in the region. Linear soft tissue density extending towards the skin surface at the left lower quadrant may reflect previously demonstrated fistula. No gas or fluid in the tract at this time.  Musculoskeletal: No acute or suspicious osseous abnormality. Advanced arthritis right  hip. Status post left hip replacement  IMPRESSION: 1. No CT evidence for acute intra-abdominal or pelvic abnormality. 2. Moderate edema and soft tissue stranding within the  subcutaneous fat of the abdominal wall. Resolved left lower lateral subcutaneous fluid collections with residual thickening and calcification in the region. Linear soft tissue densities extend to the skin surface and may reflect previously demonstrated fistulas. 3. Nonobstructing left kidney stones  Electronically Signed   By: Donavan Foil M.D.   On: 05/29/2019 17:14 DG Foot Complete Left CLINICAL DATA:  Pt tripped while wearing flip flops causing fall. Pt c/o severe pain in proximal Lt great toe with radiation into metatarsals  EXAM: LEFT FOOT - COMPLETE 3+ VIEW  COMPARISON:  None.  FINDINGS: There are nondisplaced oblique for fractures involving the first toe proximal phalanx in the midshaft, second toe proximal phalanx near the base, and also the third toe proximal phalanx in the midshaft. No evidence of dislocation. There are arthritic changes most pronounced in the mid and hindfoot.  IMPRESSION: Nondisplaced fractures involving the first through third toe proximal phalanges in the left foot as above.  Electronically Signed   By: Audie Pinto M.D.   On: 05/29/2019 12:52   Assessment  The primary encounter diagnosis was Chronic pain syndrome. Diagnoses of Chronic knee pain (Primary Area of Pain) (Bilateral) (R>L), Chronic hip pain (Secondary area of Pain) (Right), Chronic low back pain (Third area of Pain) (Bilateral) (R>L), Fibromyalgia, Neurogenic pain, Hypomagnesemia, Opiate use, and Long term current use of opiate analgesic were also pertinent to this visit.  Plan of Care  Problem-specific:  No problem-specific Assessment & Plan notes found for this encounter.  I have discontinued Karen Lewis's omeprazole, clotrimazole, and ondansetron. I am also having her start on oxyCODONE and oxyCODONE. Additionally, I am having her maintain her montelukast, spironolactone, metFORMIN, albuterol, acyclovir, ketoconazole, zonisamide, triamcinolone, amitriptyline, Biotin,  zolpidem, vitamin C, Emollient (A + D PERSONAL CARE LOTION EX), EPINEPHrine, diphenhydrAMINE, levothyroxine, lisinopril, naloxone, QUEtiapine, tiZANidine, metoprolol tartrate, nystatin, Accu-Chek Aviva Plus, rosuvastatin, sertraline, Vitamin D (Ergocalciferol), diclofenac sodium, Multi-Vitamin, ACETAMINOPHEN PO, calcium carbonate, famotidine, oxyCODONE, pregabalin, and magnesium oxide.  Pharmacotherapy (Medications Ordered): Meds ordered this encounter  Medications  . oxyCODONE (OXY IR/ROXICODONE) 5 MG immediate release tablet    Sig: Take 1 tablet (5 mg total) by mouth 5 (five) times daily. Must last 30 days    Dispense:  150 tablet    Refill:  0    Chronic Pain: STOP Act (Not applicable) Fill 1 day early if closed on refill date. Do not fill until: 09/07/2019. To last until: 10/07/2019. Avoid benzodiazepines within 8 hours of opioids  . oxyCODONE (OXY IR/ROXICODONE) 5 MG immediate release tablet    Sig: Take 1 tablet (5 mg total) by mouth 5 (five) times daily. Must last 30 days    Dispense:  150 tablet    Refill:  0    Chronic Pain: STOP Act (Not applicable) Fill 1 day early if closed on refill date. Do not fill until: 10/07/2019. To last until: 11/06/2019. Avoid benzodiazepines within 8 hours of opioids  . oxyCODONE (OXY IR/ROXICODONE) 5 MG immediate release tablet    Sig: Take 1 tablet (5 mg total) by mouth 5 (five) times daily. Must last 30 days    Dispense:  150 tablet    Refill:  0    Chronic Pain: STOP Act (Not applicable) Fill 1 day early if closed on refill date. Do not fill until: 11/06/2019. To last until: 12/06/2019. Avoid  benzodiazepines within 8 hours of opioids  . pregabalin (LYRICA) 225 MG capsule    Sig: Take 1 capsule (225 mg total) by mouth 2 (two) times daily.    Dispense:  60 capsule    Refill:  5    Fill one day early if pharmacy is closed on scheduled refill date. May substitute for generic if available.  . magnesium oxide (MAG-OX) 400 MG tablet    Sig: Take 1 tablet (400  mg total) by mouth 2 (two) times daily.    Dispense:  60 tablet    Refill:  5    Fill one day early if pharmacy is closed on scheduled refill date. May substitute for generic if available.   Orders:  Orders Placed This Encounter  Procedures  . Hyalgan Knee Inj. (PRN)    For knee pain. Hyalgan Knee injection(s). Please order hyalgan to be available.    Standing Status:   Standing    Number of Occurrences:   5    Standing Expiration Date:   08/19/2020    Scheduling Instructions:     Procedure: Intra-articular Hyalgan Knee injection      Fluoroscopy Needed.     Side(s): Bilateral Knee     Sedation: None     TIMEFRAME: PRN procedure. (Karen Lewis will call when needed.)    Order Specific Question:   Where will this procedure be performed?    Answer:   [facility]   Follow-up plan:   Return in about 4 months (around 12/05/2019) for (VV), (MM), in addition, PRN Procedure(s): (B) Hyalgan Knee injection (Fluoro needed).     Considering:  NOTE: NO RFA until BMI less or equal to 35. No interventional therapies until she fully heals from her Mycobacterium infection   Palliative PRN treatment(s):  Palliative bilateral IA Hyalgan knee injections  Palliative bilateral genicular NB  Palliative bilateral lumbar facet blocks     Recent Visits No visits were found meeting these conditions.  Showing recent visits within past 90 days and meeting all other requirements   Today's Visits Date Type Provider Dept  08/20/19 Telemedicine Milinda Pointer, MD [facility]  Showing today's visits and meeting all other requirements   Future Appointments No visits were found meeting these conditions.  Showing future appointments within next 90 days and meeting all other requirements   I discussed the assessment and treatment plan with the patient. The patient was provided an opportunity to ask questions and all were answered. The patient agreed with the plan and demonstrated an  understanding of the instructions.  Patient advised to call back or seek an in-person evaluation if the symptoms or condition worsens.  Total duration of non-face-to-face encounter: 15 minutes.  Note by: Gaspar Cola, MD Date: 08/20/2019; Time: 10:11 AM  Note: This dictation was prepared with Dragon dictation. Any transcriptional errors that may result from this process are unintentional.  Disclaimer:  * Given the special circumstances of the COVID-19 pandemic, the federal government has announced that the Office for Civil Rights (OCR) will exercise its enforcement discretion and will not impose penalties on physicians using telehealth in the event of noncompliance with regulatory requirements under the [place] (HIPAA) in connection with the good faith provision of telehealth during the XX123456 national public health emergency. ([place])

## 2019-08-21 ENCOUNTER — Ambulatory Visit: Payer: Medicare HMO | Admitting: Infectious Diseases

## 2019-08-21 ENCOUNTER — Encounter (INDEPENDENT_AMBULATORY_CARE_PROVIDER_SITE_OTHER): Payer: Self-pay | Admitting: Vascular Surgery

## 2019-08-27 DIAGNOSIS — M1611 Unilateral primary osteoarthritis, right hip: Secondary | ICD-10-CM | POA: Diagnosis not present

## 2019-08-29 ENCOUNTER — Telehealth (INDEPENDENT_AMBULATORY_CARE_PROVIDER_SITE_OTHER): Payer: Self-pay

## 2019-08-29 NOTE — Telephone Encounter (Signed)
Patient called wanting to know what is going on with her lymphedema pump. Patient stated that she hasn't heard from anyone regarding getting her pump. Patient unsure who she getting the pump from, she stated that someone came out and showed her how to use the pump but she hasn't heard from them since. Patient stated she went to [facility] to get fitted for compression hose and was told she could get one with them. I reached out to [facility] at [place] and he will call the patient and speak with her about her insurance.

## 2019-08-30 ENCOUNTER — Telehealth (INDEPENDENT_AMBULATORY_CARE_PROVIDER_SITE_OTHER): Payer: Self-pay

## 2019-09-03 DIAGNOSIS — I890 Lymphedema, not elsewhere classified: Secondary | ICD-10-CM | POA: Diagnosis not present

## 2019-09-03 NOTE — Telephone Encounter (Signed)
Complete

## 2019-09-05 ENCOUNTER — Other Ambulatory Visit (INDEPENDENT_AMBULATORY_CARE_PROVIDER_SITE_OTHER): Payer: Self-pay | Admitting: Nurse Practitioner

## 2019-09-05 ENCOUNTER — Telehealth (INDEPENDENT_AMBULATORY_CARE_PROVIDER_SITE_OTHER): Payer: Self-pay | Admitting: Vascular Surgery

## 2019-09-05 DIAGNOSIS — I890 Lymphedema, not elsewhere classified: Secondary | ICD-10-CM

## 2019-09-05 NOTE — Telephone Encounter (Signed)
Patient has been aware referral has been sent

## 2019-09-05 NOTE — Telephone Encounter (Signed)
Referral was placed 

## 2019-09-05 NOTE — Telephone Encounter (Signed)
WANTS TO KNOW IF [facility] WILL REFER HER TO A LYMPHEDEMA CLINIC. STATES THEY WILL HELP HER GET THE CUSTOM WRAP THAT SHE NEEDS.I THINK SHE MAY BE REFERRING TO LIKE [facility]. BECAUSE SHE MENTIONED BUYING IT IN THE STORE.

## 2019-09-05 NOTE — Progress Notes (Unsigned)
lymp

## 2019-09-13 DIAGNOSIS — E1142 Type 2 diabetes mellitus with diabetic polyneuropathy: Secondary | ICD-10-CM | POA: Diagnosis not present

## 2019-09-14 DIAGNOSIS — Z96643 Presence of artificial hip joint, bilateral: Secondary | ICD-10-CM | POA: Insufficient documentation

## 2019-09-14 DIAGNOSIS — M170 Bilateral primary osteoarthritis of knee: Secondary | ICD-10-CM | POA: Insufficient documentation

## 2019-09-14 DIAGNOSIS — I10 Essential (primary) hypertension: Secondary | ICD-10-CM | POA: Insufficient documentation

## 2019-09-14 DIAGNOSIS — Z6841 Body Mass Index (BMI) 40.0 and over, adult: Secondary | ICD-10-CM | POA: Insufficient documentation

## 2019-09-15 DIAGNOSIS — M797 Fibromyalgia: Secondary | ICD-10-CM | POA: Diagnosis not present

## 2019-09-15 DIAGNOSIS — G894 Chronic pain syndrome: Secondary | ICD-10-CM | POA: Diagnosis not present

## 2019-09-27 ENCOUNTER — Encounter: Payer: Self-pay | Admitting: Occupational Therapy

## 2019-09-27 ENCOUNTER — Other Ambulatory Visit
Admission: RE | Admit: 2019-09-27 | Discharge: 2019-09-27 | Disposition: A | Discharge status: Home / Self Care | Payer: Medicare HMO | Source: Ambulatory Visit | Attending: Infectious Diseases | Admitting: Infectious Diseases

## 2019-09-27 ENCOUNTER — Encounter: Payer: Self-pay | Admitting: Infectious Diseases

## 2019-09-27 ENCOUNTER — Ambulatory Visit: Payer: Medicare HMO | Attending: Infectious Diseases | Admitting: Infectious Diseases

## 2019-09-27 ENCOUNTER — Other Ambulatory Visit: Payer: Self-pay

## 2019-09-27 ENCOUNTER — Ambulatory Visit: Payer: Medicare HMO | Attending: Nurse Practitioner | Admitting: Occupational Therapy

## 2019-09-27 VITALS — BP 117/64 | HR 91 | Temp 99.5°F | Resp 16 | Ht 64.0 in | Wt 367.0 lb

## 2019-09-27 DIAGNOSIS — M1611 Unilateral primary osteoarthritis, right hip: Secondary | ICD-10-CM

## 2019-09-27 DIAGNOSIS — M25552 Pain in left hip: Secondary | ICD-10-CM | POA: Diagnosis not present

## 2019-09-27 DIAGNOSIS — I10 Essential (primary) hypertension: Secondary | ICD-10-CM

## 2019-09-27 DIAGNOSIS — A319 Mycobacterial infection, unspecified: Secondary | ICD-10-CM

## 2019-09-27 DIAGNOSIS — Z7984 Long term (current) use of oral hypoglycemic drugs: Secondary | ICD-10-CM

## 2019-09-27 DIAGNOSIS — E119 Type 2 diabetes mellitus without complications: Secondary | ICD-10-CM

## 2019-09-27 DIAGNOSIS — F339 Major depressive disorder, recurrent, unspecified: Secondary | ICD-10-CM

## 2019-09-27 DIAGNOSIS — L03116 Cellulitis of left lower limb: Secondary | ICD-10-CM | POA: Diagnosis not present

## 2019-09-27 DIAGNOSIS — Z886 Allergy status to analgesic agent status: Secondary | ICD-10-CM

## 2019-09-27 DIAGNOSIS — Z79899 Other long term (current) drug therapy: Secondary | ICD-10-CM

## 2019-09-27 DIAGNOSIS — I890 Lymphedema, not elsewhere classified: Secondary | ICD-10-CM | POA: Insufficient documentation

## 2019-09-27 DIAGNOSIS — Z9884 Bariatric surgery status: Secondary | ICD-10-CM

## 2019-09-27 DIAGNOSIS — Z91013 Allergy to seafood: Secondary | ICD-10-CM

## 2019-09-27 DIAGNOSIS — Z8614 Personal history of Methicillin resistant Staphylococcus aureus infection: Secondary | ICD-10-CM

## 2019-09-27 DIAGNOSIS — Z881 Allergy status to other antibiotic agents status: Secondary | ICD-10-CM

## 2019-09-27 DIAGNOSIS — M793 Panniculitis, unspecified: Secondary | ICD-10-CM

## 2019-09-27 DIAGNOSIS — Z888 Allergy status to other drugs, medicaments and biological substances status: Secondary | ICD-10-CM

## 2019-09-27 DIAGNOSIS — L0390 Cellulitis, unspecified: Secondary | ICD-10-CM | POA: Diagnosis not present

## 2019-09-27 LAB — CBC WITH DIFFERENTIAL/PLATELET
Abs Immature Granulocytes: 0.04 10*3/uL (ref 0.00–0.07)
Basophils Absolute: 0.1 10*3/uL (ref 0.0–0.1)
Basophils Relative: 1 %
Eosinophils Absolute: 0.2 10*3/uL (ref 0.0–0.5)
Eosinophils Relative: 2 %
HCT: 39.1 % (ref 36.0–46.0)
Hemoglobin: 12.5 g/dL (ref 12.0–15.0)
Immature Granulocytes: 0 %
Lymphocytes Relative: 19 %
Lymphs Abs: 2 10*3/uL (ref 0.7–4.0)
MCH: 26.7 pg (ref 26.0–34.0)
MCHC: 32 g/dL (ref 30.0–36.0)
MCV: 83.5 fL (ref 80.0–100.0)
Monocytes Absolute: 0.5 10*3/uL (ref 0.1–1.0)
Monocytes Relative: 5 %
Neutro Abs: 8.2 10*3/uL — ABNORMAL HIGH (ref 1.7–7.7)
Neutrophils Relative %: 73 %
Platelets: 342 10*3/uL (ref 150–400)
RBC: 4.68 MIL/uL (ref 3.87–5.11)
RDW: 14.1 % (ref 11.5–15.5)
WBC: 11 10*3/uL — ABNORMAL HIGH (ref 4.0–10.5)
nRBC: 0 % (ref 0.0–0.2)

## 2019-09-27 LAB — COMPREHENSIVE METABOLIC PANEL
ALT: 30 U/L (ref 0–44)
AST: 41 U/L (ref 15–41)
Albumin: 3.8 g/dL (ref 3.5–5.0)
Alkaline Phosphatase: 90 U/L (ref 38–126)
Anion gap: 11 (ref 5–15)
BUN: 19 mg/dL (ref 6–20)
CO2: 24 mmol/L (ref 22–32)
Calcium: 8.9 mg/dL (ref 8.9–10.3)
Chloride: 105 mmol/L (ref 98–111)
Creatinine, Ser: 0.77 mg/dL (ref 0.44–1.00)
GFR calc Af Amer: >60 mL/min (ref >60)
GFR calc non Af Amer: >60 mL/min (ref >60)
Glucose, Bld: 134 mg/dL — ABNORMAL HIGH (ref 70–99)
Potassium: 4.6 mmol/L (ref 3.5–5.1)
Sodium: 140 mmol/L (ref 135–145)
Total Bilirubin: 0.5 mg/dL (ref 0.3–1.2)
Total Protein: 7.8 g/dL (ref 6.5–8.1)

## 2019-09-27 LAB — C-REACTIVE PROTEIN: CRP: 0.7 mg/dL (ref <1.0)

## 2019-09-27 LAB — SEDIMENTATION RATE: Sed Rate: 30 mm/hr — ABNORMAL HIGH (ref 0–20)

## 2019-09-27 MED ORDER — AMOXICILLIN-POT CLAVULANATE 875-125 MG PO TABS: 1.0000 | ORAL_TABLET | Freq: Two times a day (BID) | ORAL | 0 refills | Status: DC

## 2019-09-27 NOTE — Patient Instructions (Signed)

## 2019-09-27 NOTE — Progress Notes (Signed)
NAME: Karen Lewis  DOB: 05-14-1971  MRN: [id number]  Date/Time: 09/27/2019 11:08 AM   Subjective:  Pt sent from her PCP's office for a new area on the left thigh  Karen Lewis is a 49 y.o. 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 Pt suffers from lymphedema of legs and has been seeing vascular and is to get compression wraps and pumps soon.  She had Mycobacterium abscessus on the abdominal wall following lipectomy and was treated for nearly 4 months and those lesions completely healed  She says she has had the orange skin appearace of the rt thigh and left hip area  For a few weeks and on Sunday noted some pain and Tuesday the area was darkening in the center. She has some chills but no fever at home- today ehr temp is 99.19 Jul 2019           Medical history  Patient has a complicated medical history. She had a gastric sleeve in 2013 and lost 150 pounds initially.  She then started gaining weight and then underwent gastric bypass Roux-en-Y September 2018.  She had another 50 pound weight loss.  She underwent elective panniculectomy in October 2019.  The surgical site did not heal completely and had persistent discharge.  Initially the culture had Pseudomonas and MRSA and she was taking Levaquin and doxycycline.  She also had a wound VAC at one point.  On Feb 07, 2019 she underwent panniculectomy because of ongoing discharge erythema and pain.  The culture from that site grew Mycobacterium abscesses.  I saw her on 02/22/2019.  She was started on treatment with IV impipenem, tigecycline, amikacin, clofazimine in varying combinations- her medication course complicated by low albumin, abnormal LFTS, anemia, low magnesium, increasing edema legs , needing hospitalization at [facility] to correct elctrolytes- She also received iron  transfusion as OP with heme at [facility]  Since end of October she has been off meds and the original wounds have all healed  06/19/2019         Past Medical History:  Diagnosis Date  . Anemia   . Anginal pain ([place])   . Anxiety   . Arthralgia of hip 07/29/2015  . Arthritis   . Arthritis, degenerative 07/29/2015  . Asthma   . Cephalalgia 07/25/2014  . Dependence on unknown drug ([place])    multiplt controlled drug dependence  . Depression   . Diabetes mellitus without complication ([place])   . Dysrhythmia   . Eczema   . Fibromyalgia   . Gastritis   . GERD (gastroesophageal reflux disease)   . Gonalgia 07/29/2015   Overview:  Overview:  The patient has had bilateral intra-articular Hyalgan injections done on 07/16/2014 and although she seems to do well with this type of therapy, apparently her insurance company does not want to pay for they Hyalgan. On 11/27/2014 the patient underwent a bilateral genicular nerve block with excellent results. On 01/28/2015 she had a right knee genicular radiofrequency ablatio  . Gout   . H/O cardiovascular disorder 03/10/2015  . H/O surgical procedure 12/05/2012   Overview:  LSG ([facility] - April 2013)    . H/O thyroid disease 03/10/2015  . Headache   . Herpes   . History of artificial joint 07/29/2015  . History of hiatal hernia   . Hypertension   .  Hypomagnesemia   . Hypothyroidism   . IDA (iron deficiency anemia) 05/28/2019  . LBP (low back pain) 07/29/2015  . Neuromuscular disorder ([place])   . Obesity   . PCOS (polycystic ovarian syndrome)   . Primary osteoarthritis of both knees 07/29/2015  . Sleep apnea   . Thyroid nodule bilateral    Past Surgical History:  Procedure Laterality Date  . APPENDECTOMY    . CARPAL TUNNEL RELEASE Bilateral   . CHOLECYSTECTOMY    . DIAGNOSTIC LAPAROSCOPY    . HIATAL HERNIA REPAIR    . JOINT REPLACEMENT Left hip  . LAPAROSCOPIC PARTIAL GASTRECTOMY    . left trigger finger    . peniculectomy N/A 07/05/2018  .  ROUX-EN-Y GASTRIC BYPASS  06/03/2017  . SHOULDER ARTHROSCOPY Right   . THYROIDECTOMY N/A 11/12/2015   Procedure: THYROIDECTOMY;  Surgeon: Clyde Canterbury, MD;  Location: [facility] ORS;  Service: ENT;  Laterality: N/A;  . TRIGGER FINGER RELEASE Right     Social History   Socioeconomic History  . Marital status: Significant Other    Spouse name: Not on file  . Number of children: Not on file  . Years of education: Not on file  . Highest education level: Not on file  Occupational History  . Not on file  Tobacco Use  . Smoking status: Never Smoker  . Smokeless tobacco: Never Used  Substance and Sexual Activity  . Alcohol use: No    Alcohol/week: 0.0 standard drinks  . Drug use: No  . Sexual activity: Not Currently    Birth control/protection: Injection  Other Topics Concern  . Not on file  Social History Narrative  . Not on file   Social Determinants of Health   Financial Resource Strain:   . Difficulty of Paying Living Expenses: Not on file  Food Insecurity:   . Worried About Charity fundraiser in the Last Year: Not on file  . Ran Out of Food in the Last Year: Not on file  Transportation Needs:   . Lack of Transportation (Medical): Not on file  . Lack of Transportation (Non-Medical): Not on file  Physical Activity:   . Days of Exercise per Week: Not on file  . Minutes of Exercise per Session: Not on file  Stress:   . Feeling of Stress : Not on file  Social Connections:   . Frequency of Communication with Friends and Family: Not on file  . Frequency of Social Gatherings with Friends and Family: Not on file  . Attends Religious Services: Not on file  . Active Member of Clubs or Organizations: Not on file  . Attends Archivist Meetings: Not on file  . Marital Status: Not on file  Intimate Partner Violence:   . Fear of Current or Ex-Partner: Not on file  . Emotionally Abused: Not on file  . Physically Abused: Not on file  . Sexually Abused: Not on file    Family  History  Problem Relation Age of Onset  . Anxiety disorder Mother   . Depression Mother   . Alcohol abuse Mother   . Diabetes Mother   . Hypertension Mother   . Kidney cancer Mother   . Sleep apnea Mother   . Alcohol abuse Father   . Anxiety disorder Father   . Depression Father   . Post-traumatic stress disorder Father   . Kidney failure Father   . COPD Father   . Diabetes Father   . Hypertension Father   . Sleep apnea  Father   . Depression Brother   . Diabetes Brother   . Hypertension Brother   . Sleep apnea Brother   . Breast cancer Paternal Aunt   . Bladder Cancer Neg Hx   . Prostate cancer Neg Hx    Allergies  Allergen Reactions  . Bactrim [Sulfamethoxazole-Trimethoprim] Hives  . Omalizumab Itching and Hives  . Ciprofloxacin Other (See Comments)    Muscle Pain Possible myalgias. myalgia Possible myalgias. myalgia  . Shellfish Allergy Other (See Comments)    unknown  . Sulfamethoxazole     Other reaction(s): Hives  . Trimethoprim Hives  . Aspirin Other (See Comments)    GI UPSET  . Clindamycin Rash  . Motrin [Ibuprofen]     Other reaction(s): Other (See Comments) GI UPSET  . Nsaids Nausea Only and Other (See Comments)    Stomach upset Other reaction(s): Other (See Comments) Stomach upset  . Other Hives  . Sulfa Antibiotics Hives    ? Current Outpatient Medications  Medication Sig Dispense Refill  . ACCU-CHEK AVIVA PLUS test strip     . ACETAMINOPHEN PO Take 650 mg by mouth every 6 (six) hours as needed.    Marland Kitchen acyclovir (ZOVIRAX) 800 MG tablet Take 800 mg by mouth as needed. Reported on 11/12/2015    . albuterol (PROVENTIL HFA;VENTOLIN HFA) 108 (90 BASE) MCG/ACT inhaler Inhale 2 puffs into the lungs every 6 (six) hours as needed for wheezing or shortness of breath.    Marland Kitchen amitriptyline (ELAVIL) 100 MG tablet 100 mg at bedtime.   3  . Biotin 10 MG CAPS     . calcium carbonate (TUMS - DOSED IN MG ELEMENTAL CALCIUM) 500 MG chewable tablet Chew 1 tablet by  mouth daily.    . clonazePAM (KLONOPIN) 0.5 MG tablet Take 0.5 mg by mouth 3 (three) times daily as needed for anxiety.    . diclofenac sodium (VOLTAREN) 1 % GEL Apply 2 g topically 4 (four) times daily. 100 g 1  . diphenhydrAMINE (BENADRYL) 25 mg capsule Take 25 mg by mouth as needed.    . Emollient (A + D PERSONAL CARE LOTION EX) Apply 1 application topically as needed.    Marland Kitchen EPINEPHrine 0.3 mg/0.3 mL IJ SOAJ injection Inject 0.3 mg into the skin as needed.    . famotidine (PEPCID) 20 MG tablet Take 20 mg by mouth 2 (two) times daily.    Marland Kitchen ketoconazole (NIZORAL) 2 % cream APPLY AS DIRECTED TO AFFECTED AREA TWICE DAILY AS NEEDED    . levothyroxine (SYNTHROID) 125 MCG tablet Take 250 mcg by mouth.     Marland Kitchen lisinopril (ZESTRIL) 5 MG tablet Take by mouth.    . magnesium oxide (MAG-OX) 400 MG tablet Take 1 tablet (400 mg total) by mouth 2 (two) times daily. (Patient taking differently: Take 400 mg by mouth daily. 3 In am and 2 in PM) 60 tablet 5  . metFORMIN (GLUCOPHAGE) 1000 MG tablet Take 1,000 mg by mouth 2 (two) times daily with a meal.     . metoprolol tartrate (LOPRESSOR) 50 MG tablet Take 75 mg by mouth 2 (two) times daily.     . montelukast (SINGULAIR) 10 MG tablet Take 10 mg by mouth at bedtime.     . Multiple Vitamin (MULTI-VITAMIN) tablet Take 2 tablets by mouth daily.    . naloxone (NARCAN) nasal spray 4 mg/0.1 mL Spray into one nostril. Repeat with second device into other nostril after 2-3 minutes if no or minimal response. 1 kit 0  .  oxyCODONE (OXY IR/ROXICODONE) 5 MG immediate release tablet Take 1 tablet (5 mg total) by mouth 5 (five) times daily. Must last 30 days 150 tablet 0  . [START ON 10/07/2019] oxyCODONE (OXY IR/ROXICODONE) 5 MG immediate release tablet Take 1 tablet (5 mg total) by mouth 5 (five) times daily. Must last 30 days 150 tablet 0  . [START ON 11/06/2019] oxyCODONE (OXY IR/ROXICODONE) 5 MG immediate release tablet Take 1 tablet (5 mg total) by mouth 5 (five) times daily.  Must last 30 days 150 tablet 0  . pregabalin (LYRICA) 225 MG capsule Take 1 capsule (225 mg total) by mouth 2 (two) times daily. 60 capsule 5  . QUEtiapine (SEROQUEL) 100 MG tablet Take 100 mg by mouth at bedtime.    . rosuvastatin (CRESTOR) 40 MG tablet     . sertraline (ZOLOFT) 100 MG tablet Take 200 mg by mouth daily.     Marland Kitchen spironolactone (ALDACTONE) 25 MG tablet Take 37.5 mg by mouth 2 (two) times daily.     Marland Kitchen tiZANidine (ZANAFLEX) 4 MG tablet Take 4 mg by mouth 2 (two) times daily as needed.     . triamcinolone (NASACORT AQ) 55 MCG/ACT AERO nasal inhaler 2 sprays by Nasal route 2 (two) times daily.    . vitamin C (ASCORBIC ACID) 500 MG tablet Take 500 mg by mouth daily.    . Vitamin D, Ergocalciferol, (DRISDOL) 1.25 MG (50000 UT) CAPS capsule 2 (two) times a week.     . zolpidem (AMBIEN) 10 MG tablet Take 10 mg by mouth at bedtime as needed for sleep.    Marland Kitchen zonisamide (ZONEGRAN) 50 MG capsule Take 150 mg by mouth daily. 3 tabs at bedtime     No current facility-administered medications for this visit.     Abtx:  Anti-infectives (From admission, onward)   None      REVIEW OF SYSTEMS:  Const: negative fever, negative chills, negative weight loss Eyes: negative diplopia or visual changes, negative eye pain ENT: negative coryza, beginning of sore throat Resp: negative cough, hemoptysis, dyspnea Cards: negative for chest pain, palpitations, has lower extremity edema LO:[id number] dysuria which resolves with cranberry juice GI: Negative for abdominal pain, occasional loose stool , no bleeding, constipation Skin: negative for rash and pruritus Heme: negative for easy bruising and gum/nose bleeding MS:Rt  hip pain, - saw Dr.Menz for possible THA  Neurolo:negative for headaches, dizziness, vertigo, memory problems  Psych: Some anxiety Allergy/Immunology-medication allergy as noted above. Objective:  VITALS:  BP 117/64   Pulse 91   Temp 99.5 F (37.5 C) (Oral)   Resp 16   Ht 5'  4" (1.626 m)   Wt (!) 367 lb (166.5 kg)   SpO2 94%   BMI 63.00 kg/m  PHYSICAL EXAM:  General: Alert, cooperative, no distress, appears stated age.  In wheel chair- high BMI Head: Normocephalic, without obvious abnormality, atraumatic. Eyes: Conjunctivae clear, anicteric sclerae. Pupils are equal ENT Nares normal. No drainage or sinus tenderness. Lips, mucosa, and tongue normal. No Thrush Neck: Supple, Lungs: Clear to auscultation bilaterally. No Wheezing or Rhonchi. No rales. Heart: Regular rate and rhythm, no murmur, rub or gallop. Abdomen: abdominal wall wounds have all healed     A new area of induration, erythema with central darkening top of the left hip laterally     lympedema rt thigh area Peau de orange appearance        02/22/19     Skin: No rashes or lesions. Or bruising Lymph: Cervical, supraclavicular  normal. Neurologic: Grossly non-focal Pertinent Labs Lab Results  08/16/19 labs from [facility]- normal with no anemia, albumin normal   ? Impression/Recommendation  Cellulitis with underlying lymph edema of the left thigh: what is concerning is the area of darkness in the center which looks like early ischemia/vasculitis ( calciphylaxis presents this way but she does not have the risk)- No systemic symptoms or signs Will treat now as cellulitis with Augmentin I dont think this is Mycobacterium abscessus  She has Left THA and hardware- currently this looks to be superficial in the subcutaneous tissue Need to observe this closely as this may evolve inspite of antibiotics and will then need surgical intervention. Will do labs today- cbc/cmp/esr/crp Asked her to make sure that it is not pressure from her chair   ? 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  Completely healed Had 4 months of treatment and completed end of oct/nov  Malnutrition with hypo albuminemia, hypocalcemia, hypomagnesemia has all resolved corrected.  Iron  deficiency anemia.  Resolved   Right hip arthritis.  Planning for surgery sometime in the future.  Left total hip arthroplasty.  Gastric bypass surgery  leading to weight loss necessitating panniculectomy. Complicated by Mycobacterium abscessus infection  Hypertension on metoprolol and lisinopril and spironolactone  Diabetes mellitus on metformin  Depression on multiple SSRIs.  Follow up with PCP / with me PRN  ___________________________________________________

## 2019-09-27 NOTE — Therapy (Signed)
[place] MAIN [facility] SERVICES [address] [place], [place], [address] Phone: [phone]   Fax:  [phone]  Occupational Therapy Evaluation  Patient Details  Name: Karen Lewis MRN: [id number] Date of Birth: 1971/02/24 Referring Provider (OT): Eulogio Ditch    Encounter Date: 09/27/2019  OT End of Session - 09/27/19 1553    Visit Number  1    Number of Visits  1    Date for OT Re-Evaluation  10/26/18    Authorization Type  OT evaluation and Discharge    Activity Tolerance  Patient tolerated treatment well    Behavior During Therapy  [facility] for tasks assessed/performed       Past Medical History:  Diagnosis Date  . Anemia   . Anginal pain ([place])   . Anxiety   . Arthralgia of hip 07/29/2015  . Arthritis   . Arthritis, degenerative 07/29/2015  . Asthma   . Cephalalgia 07/25/2014  . Dependence on unknown drug ([place])    multiplt controlled drug dependence  . Depression   . Diabetes mellitus without complication ([place])   . Dysrhythmia   . Eczema   . Fibromyalgia   . Gastritis   . GERD (gastroesophageal reflux disease)   . Gonalgia 07/29/2015   Overview:  Overview:  The patient has had bilateral intra-articular Hyalgan injections done on 07/16/2014 and although she seems to do well with this type of therapy, apparently her insurance company does not want to pay for they Hyalgan. On 11/27/2014 the patient underwent a bilateral genicular nerve block with excellent results. On 01/28/2015 she had a right knee genicular radiofrequency ablatio  . Gout   . H/O cardiovascular disorder 03/10/2015  . H/O surgical procedure 12/05/2012   Overview:  LSG ([facility] - April 2013)    . H/O thyroid disease 03/10/2015  . Headache   . Herpes   . History of artificial joint 07/29/2015  . History of hiatal hernia   . Hypertension   . Hypomagnesemia   . Hypothyroidism   . IDA (iron deficiency anemia) 05/28/2019  . LBP (low back pain) 07/29/2015  . Neuromuscular  disorder ([place])   . Obesity   . PCOS (polycystic ovarian syndrome)   . Primary osteoarthritis of both knees 07/29/2015  . Sleep apnea   . Thyroid nodule bilateral    Past Surgical History:  Procedure Laterality Date  . APPENDECTOMY    . CARPAL TUNNEL RELEASE Bilateral   . CHOLECYSTECTOMY    . DIAGNOSTIC LAPAROSCOPY    . HIATAL HERNIA REPAIR    . JOINT REPLACEMENT Left hip  . LAPAROSCOPIC PARTIAL GASTRECTOMY    . left trigger finger    . peniculectomy N/A 07/05/2018  . ROUX-EN-Y GASTRIC BYPASS  06/03/2017  . SHOULDER ARTHROSCOPY Right   . THYROIDECTOMY N/A 11/12/2015   Procedure: THYROIDECTOMY;  Surgeon: Clyde Canterbury, MD;  Location: [facility] ORS;  Service: ENT;  Laterality: N/A;  . TRIGGER FINGER RELEASE Right     There were no vitals filed for this visit.  Subjective Assessment - 09/27/19 1335    Subjective   Karen Lewis is a 49 y o female l referred for evaluation and treatment of BLE leg swelling by Eulogio Ditch, NP, at [facility]. Pt reports long standing leg swelling with worsening over undergoing peniculectomy with non-healing wound and also over the course of the past 3 months. Pt reports swelling is mainly in her thighs and is worse in L hip and abdomen than on  the right.  Pt endorses positive family history of leg swelling in her father, paternal grandmother and mother. Pt has not previously undergone formal lymphedema treatment, but she has had custom, knee length compression stockings in the past. She reports that she stopped wearing them because they wouldn't stay in place. Hortencia Pilar, MD's note dated 08/20/19 notes she has been using ccl 1 compression stockings, but she does not mention those at our meeting today. Pt tells me she has obtained a "lymph pump" and uses it when her boyfriend is available to help her don the garments. "I can't get them on by myself." Pt's goals are to get bandages at our clinic and use wraps to control thigh and abdominal  swelling so she won't have to buy supplies. Pt hopes to reduce and control lower extremity/lower quadrant swelling.    Limitations  difficulty walking, BLE weakness, chronic pain, balance, chronic BLE swelling    Repetition  Increases Symptoms    Special Tests  negative stemmer sign bilaterally. Legs and feet spared    Patient Stated Goals  get wrapping supplies so I dont have to buy them; reduce swelling in abdomen and thighs    Currently in Pain?  Yes    Pain Score  6     Pain Location  Hip    Pain Orientation  Left    Pain Descriptors / Indicators  Burning;Aching;Heaviness;Tightness;Shooting;Radiating;Stabbing    Pain Type  Chronic pain    Pain Radiating Towards  inferiorly    Pain Onset  More than a month ago    Pain Frequency  Constant    Aggravating Factors   weight bearing, flexion    Pain Relieving Factors  stretching out, massaging    Effect of Pain on Daily Activities  hip pain and BLE swelling  and edema associated pain above the knees limits functional ambulation and mobility;  limits standing  and walking tolerance necessary for basic and instrumental ADLs performance, participation in work and productive activities, participation in leisure pursuits and social participation    Multiple Pain Sites  Yes    Pain Score  4    Pain Location  Leg   abdominal   Pain Orientation  Right;Left    Pain Descriptors / Indicators  Tightness;Heaviness;Other (Comment)   fullness   Pain Type  Chronic pain    Pain Onset  More than a month ago    Pain Frequency  Constant    Aggravating Factors   standing, walking, sitting on scooter    Pain Relieving Factors  stretching out, rubbing    Effect of Pain on Daily Activities  see above        [facility] OT Assessment - 09/27/19 0001      Assessment   Medical Diagnosis  Mild, stage 2, BLE llipo-lymphedema 2/2 lipedema, morbid obesity    Referring Provider (OT)  Eulogio Ditch     Onset Date/Surgical Date  09/27/19    Hand Dominance  Right     Prior Therapy  no LE therapy in the past, had custom compression stockings in the past-knee length-- wouldnt stay up      Precautions   Precautions  Fall      Balance Screen   Has the patient fallen in the past 6 months  Yes    How many times?  3    Has the patient had a decrease in activity level because of a fear of falling?   Yes    Is the patient  reluctant to leave their home because of a fear of falling?   Yes      Home  Environment   Family/patient expects to be discharged to:  Private residence    Living Arrangements  Spouse/significant other    Available Help at Discharge  Family    Type of Shepherd entrance    [place]  One level    Bathroom Shower/Tub  Walk-in Advertising account planner seat;Grab bars - tub/shower;Hand held Tourist information centre manager - 4 wheels      Prior Function   Level of Independence  Independent    Leisure  read, crochet, cross stitch, fosters kittens      ADL   Eating/Feeding  Modified independent    Grooming  Modified independent    Upper Body Bathing  Modified independent        Type II Lipedema with morbid obesity as comorbidity Skin  Description Hyper-Keratosis Peau' de Orange Shiny Tight Fibrotic Fatty Doughy Indurated    x    x x    Skin dry Flaky Erythema Macerated         Color Redness Present Pallor Blanching Hemosiderin Staining Other           Odor Malodorous Yeast Fungal infection  Absent      x   Temperature Warm Cool wnl     x   Pitting Edema   1+ 2+ 3+ 4+ Non-pitting        x   Girth Symmetrical Asymmetrical Other Distribution   X Type II distribution buttocks, hips and thighs; legs and feet spared X     Stemmer Sign Positive Negative      negative   Lymphorrea History Of:  Present Absent      x   Wounds History Of Present Absent Venous Arterial Pressure Size   X panis            Signs of Infection  Redness Warmth Erythema Acute Swelling Drainage Borders                   Scars   Adhesions Hypersensitivity          Sensation Light Touch Deep pressure Hypersensitivty   Present Impaired Present Impaired Absent Impaired   x    x   x  Nails WNL Fungus Other   x    x Hair Growth Symmetrical Asymmetrical   x    Skin Creases Base of toes  Ankles   Base of Fingers Medial Thighs         Abdominal pannus Medial legs         x  x            OT Treatments/Exercises (OP) - 09/27/19 0001      Transfers   Transfers  Sit to Stand;Stand to Sit    Sit to Stand  6: Modified independent (Device/Increase time)    Stand to Sit  6: Modified independent (Device/Increase time)      ADLs   Grooming  hip pain limits AROM. difficulty reaching  feet to inspect skin, performn nail care, bathe feet and lower legs    UB Dressing  modified independent- extra time    LB Dressing  can independently don shoes and socks    Functional Mobility  sits on rollator to move  around home; has power scooter , but not inside the home. No manual wc. difficulty w/ bed mobility    Cooking  unable to stand to cook. sits on rolator seat to cook.     Home Maintenance  paid help    Driving  able to drive    Work  retired Marine scientist, on disability    Leisure  crotchet, tv, sedentary.    ADL Comments  elevates legs in bed   when not moving around home on rollator    ADL Education Given  Yes      Manual Therapy   Manual Therapy  Edema management            OT Education - 09/27/19 1552    Education Details  Pt edu time abbreviated due to late arrival for OT eval. Pt 20 minutes late for a 60 minute session. Provided Pt and family education regarding lymphatic structure and function, etiologies, onset patterns and stages of progression. Discussed  impact of obesity on lymphatic system function. Outlined Complete Decongestive Therapy (CDT)  as standard of care and provided in depth information regarding 4  primary components of both Intensive and Self Management Phases, including Manual Lymph Drainage (MLD), compression wrapping and garments, skin care, and therapeutic exercise.   Pilar Plate discussion of high burden of care and in this case, need for consistent , daily caregiver assistance for optimal clinical outcome. Discussed  Importance of daily, ongoing LE self-care essential to retaining clinical gains and limiting progression.  Lastly, reviewed lymphedema precautions, including cellulitis risk and difficulty with wound healing. Provided printed Lymphedema Workbook for reference.    Person(s) Educated  Patient    Methods  Explanation;Demonstration;Handout    Comprehension  Verbalized understanding;Returned demonstration          OT Long Term Goals - 09/27/19 1554      OT LONG TERM GOAL #1   Title  After skilled Pt edu Pt will demonstrate understanding of differential diagnoses of lipedema and lipo-lymphedema, and understand why CDT is not indicated at this time.    Baseline  Max A    Time  1    Period  Days    Status  Achieved    Target Date  09/27/19      OT LONG TERM GOAL #2   Title  --    Baseline  --    Time  --    Period  --    Status  --      OT LONG TERM GOAL #3   Title  --    Baseline  --    Time  --    Period  --    Status  --      OT LONG TERM GOAL #4   Title  --    Baseline  --    Time  --    Period  --    Status  --      OT LONG TERM GOAL #5   Title  --    Baseline  --    Time  --    Period  --    Status  --            Plan - 09/27/19 1617    Clinical Impression Statement  CHELLE CAYTON is a 51 y o female presenting with mild, type 2 Lipedema affecting the buttocks, hips and thighs with folds of fat to medial knees . Morbid obesity is an accompanying co-morbidity. Fat distribution  is bilateral and symmetrical downward from hips and as is typical, feet are spared. Edema is non-pitting.  Mild, peau de orange skin texture is noted at medial thighs  wheremild lobules are observed. Pt endorses ongoing leg enlargement with this fat distribution, despite elevation and recent weight loss. Lipedema may progress to lipo-lymphedema, but in this case lymphedema appears minimal and is stable. Pt will benefit from off the shelf compression garments in capri style that require minimal physical effort to don and doff as she has limited help at home. Capri style , medical grade compression garments that will contain her level of hip and thigh lipedema require at least a moderate level of physical strength and endurance to don and doff, and I do not believe this patient is able to meet those demands at present without maximum assistance. Complete Decongestive Therapy for lymphedema management are not indicated for this patient at this time. Pt is encouraged to pursue medical assistance with weight loss and nutrition, to elevate legs when seated, and to continue with her PT recommended exercise plan. Pt agrees with OT POC. Shge is discharged from Occupational Therapy having met therapy goal for today.    OT Occupational Profile and History  Comprehensive Assessment- Review of records and extensive additional review of physical, cognitive, psychosocial history related to current functional performance    Occupational Profile and client history currently impacting functional performance  co morbidities, multiple surgeries, hx of carpal tunnel with surgery, hx of trigger fingers, arthritis, R shoulder impairments with past surgical intervention.      Occupational performance deficits (Please refer to evaluation for details):  ADL's;IADL's;Social Participation;Work;Leisure    Rehab Potential  Poor    Clinical Decision Making  Several treatment options, min-mod task modification necessary    Comorbidities Affecting Occupational Performance:  Presence of comorbidities impacting occupational performance    Modification or Assistance to Complete Evaluation   Min-Moderate  modification of tasks or assist with assess necessary to complete eval    OT Frequency  One time visit    OT Treatment/Interventions  Patient/family education    Plan  DC from OT. Goal met.    Recommended Other Services  wight loss and nutriitional counseling; PT    Consulted and Agree with Plan of Care  Patient       Patient will benefit from skilled therapeutic intervention in order to improve the following deficits and impairments:           Visit Diagnosis: Lymphedema, not elsewhere classified - Plan: Ot plan of care cert/re-cert    Problem List Patient Active Problem List   Diagnosis Date Noted  . Lymphedema 08/20/2019  . Type II diabetes mellitus with complication ([place]) 21/07/5519  . Atypical chest pain 08/13/2019  . Heart palpitations 08/13/2019  . Leg swelling 07/15/2019  . Elevated LFTs 06/07/2019  . Edema of both legs 06/04/2019  . Hypocalcemia 06/04/2019  . Adult failure to thrive syndrome 06/03/2019  . IDA (iron deficiency anemia) 05/28/2019  . Mycobacterium abscessus infection 02/22/2019  . Surgical wound, non healing 09/27/2018  . Wound infection after surgery 08/11/2018  . Panniculitis 07/05/2018  . Allergy to iodine 04/26/2018  . H/O allergy to shellfish 04/26/2018  . Encounter for dental examination 02/21/2018  . Spondylosis without myelopathy or radiculopathy, lumbosacral region 02/21/2018  . History of gastric bypass 02/15/2018  . Depression 01/19/2018  . Secondary Osteoarthritis of knee (Bilateral) (R>L) 01/02/2018  . Encounter for dental exam and cleaning w/o abnormal findings 12/19/2017  . Post  op infection 12/05/2017  . Osteoarthritis of hip (Right) 07/07/2017  . Osteoarthritis of shoulder (Left) 05/31/2017  . Upper extremity pain 04/21/2017  . Chronic shoulder pain (Left) 04/21/2017  . Chronic pain syndrome 12/02/2016  . Neurogenic pain 12/02/2016  . Chest pain with low risk of acute coronary syndrome 08/04/2016  . SOB (shortness of breath)  on exertion 08/04/2016  . Dysphagia, unspecified 05/04/2016  . Gastroesophageal reflux disease without esophagitis 05/04/2016  . Vitamin D insufficiency 11/27/2015  . Nontoxic goiter 11/12/2015  . Other psychoactive substance dependence, uncomplicated ([place]) 16/06/9603  . Controlled drug dependence ([place]) 10/13/2015  . Lumbar spondylosis 09/18/2015  . Hypomagnesemia 08/27/2015  . Morbid obesity with body mass index (BMI) greater than or equal to 70 in adult [facility]) 08/27/2015  . Long term current use of opiate analgesic 07/29/2015  . Long term prescription opiate use 07/29/2015  . Opiate use 07/29/2015  . Encounter for therapeutic drug level monitoring 07/29/2015  . Opiate dependence ([place]) 07/29/2015  . Chronic knee pain (Primary Area of Pain) (Bilateral) (R>L) 07/29/2015  . Chronic low back pain (Third area of Pain) (Bilateral) (R>L) 07/29/2015  . Lumbar facet syndrome (Bilateral) (R>L) 07/29/2015  . Osteoarthritis 07/29/2015  . Grade 1 (1.4 cm) Anterolisthesis of L4 over L5 07/29/2015  . Chronic hip pain (Secondary area of Pain) (Right) 07/29/2015  . S/P THR (total hip replacement) (Left) 07/29/2015  . History of methicillin resistant staphylococcus aureus (MRSA) 07/29/2015  . History of bariatric surgery 07/29/2015  . History of methicillin resistant Staphylococcus aureus infection 07/29/2015  . Eczema 05/21/2015  . Gout 05/21/2015  . Headache, migraine 05/21/2015  . Bilateral polycystic ovarian syndrome 05/21/2015  . Disease of thyroid gland 05/21/2015  . Major depressive disorder, single episode 05/21/2015  . Dermatitis 05/21/2015  . Obstructive apnea 04/30/2015  . History of asthma 03/10/2015  . Binge eating disorder 03/10/2015  . Fibromyalgia 03/10/2015  . Anxiety, generalized 03/10/2015  . Insomnia, persistent 03/10/2015  . Depression, major, recurrent, moderate ([place]) 03/10/2015  . Avitaminosis D 04/30/2014  . Hyperlipidemia 04/25/2014  . Hyperlipidemia due to type 2  diabetes mellitus ([place]) 04/25/2014  . Uncomplicated asthma 54/05/8118  . Asthma 03/19/2014  . Goiter, nontoxic, multinodular 10/09/2013  . Apnea, sleep 12/05/2012  . History of surgical procedure 12/05/2012  . Type 2 diabetes mellitus without complication, without long-term current use of insulin ([place]) 12/05/2012  . Hypertension associated with diabetes ([place]) 12/05/2012    Andrey Spearman, MS, OTR/L, [facility] 09/27/19 4:33 PM  [place] MAIN [facility] SERVICES [address] [place], [place], [address] Phone: [phone]   Fax:  [phone]  Name: ALAIRA LEVEL MRN: [id number] Date of Birth: 22-Feb-1971

## 2019-09-27 NOTE — Patient Instructions (Signed)
You are here for a painful area on your left hip area which looks like an area of lymphedema with cellulitis-it has a central area of darkening- will need to see whether this will respond to augmentin. Will do labs - ESR/CRP/CMp/CBC

## 2019-10-02 ENCOUNTER — Telehealth: Payer: Self-pay | Admitting: Infectious Diseases

## 2019-10-02 ENCOUNTER — Encounter: Payer: Medicare HMO | Admitting: Occupational Therapy

## 2019-10-02 ENCOUNTER — Other Ambulatory Visit: Payer: Self-pay | Admitting: Infectious Diseases

## 2019-10-02 MED ORDER — DOXYCYCLINE HYCLATE 100 MG PO TABS: 100.0000 mg | ORAL_TABLET | Freq: Two times a day (BID) | ORAL | 0 refills | Status: DC

## 2019-10-02 NOTE — Telephone Encounter (Signed)
Pt called saying the patch on her left thigh is still present     and painful  and augmentin has not worked-  All labs done ( ESR/CRP/CBC/CMP) were okay. Asked her to contact Dr.Sparks to see whether any intervention including biopsy/imaging is needed Meanwhile will give Doxy to cover MRSA The area looked like vasculitis VS calciphylaxis beginning vs skin Karen Lewis

## 2019-10-04 ENCOUNTER — Encounter: Payer: Medicare HMO | Admitting: Occupational Therapy

## 2019-10-05 ENCOUNTER — Other Ambulatory Visit: Payer: Self-pay | Admitting: Internal Medicine

## 2019-10-05 ENCOUNTER — Other Ambulatory Visit: Payer: Self-pay

## 2019-10-05 ENCOUNTER — Ambulatory Visit
Admission: RE | Admit: 2019-10-05 | Discharge: 2019-10-05 | Disposition: A | Discharge status: Home / Self Care | Payer: Medicare HMO | Source: Ambulatory Visit | Attending: Internal Medicine | Admitting: Internal Medicine

## 2019-10-05 DIAGNOSIS — L0231 Cutaneous abscess of buttock: Secondary | ICD-10-CM | POA: Insufficient documentation

## 2019-10-05 DIAGNOSIS — R6883 Chills (without fever): Secondary | ICD-10-CM | POA: Diagnosis not present

## 2019-10-05 DIAGNOSIS — R509 Fever, unspecified: Secondary | ICD-10-CM | POA: Diagnosis not present

## 2019-10-05 MED ORDER — IOPAMIDOL (ISOVUE-370) INJECTION 76%
125.0000 mL | Freq: Once | INTRAVENOUS | Status: AC | PRN
  Administered 2019-10-05: 125 mL via INTRAVENOUS

## 2019-10-08 ENCOUNTER — Telehealth: Payer: Self-pay

## 2019-10-08 NOTE — Telephone Encounter (Signed)
Patient Karen Lewis stating Doxy not helping. Increased swelling and heat and redness at site. She told her PCP and said he may contact us. Also reports her CT showed no abscess. Wants to know next steps as she is not feeling well skin is dry and cracking at site.

## 2019-10-08 NOTE — Telephone Encounter (Signed)
Spoke to patient- no response to antibiotics covering usual pathogens. ESR/ESR/WBC not impressive. Ct scan no abscess- Just skin and [place] edema- Will discuss with her PCP -Dr.Sparks( left message) .Included him in the message. She will have to see dermatology, she will need a biopsy for HPE and culture  bacteria, AFB, fungus Asked her to stop Doxy  

## 2019-10-09 ENCOUNTER — Encounter: Payer: Medicare HMO | Admitting: Occupational Therapy

## 2019-10-10 DIAGNOSIS — L989 Disorder of the skin and subcutaneous tissue, unspecified: Secondary | ICD-10-CM | POA: Diagnosis not present

## 2019-10-10 DIAGNOSIS — I890 Lymphedema, not elsewhere classified: Secondary | ICD-10-CM | POA: Diagnosis not present

## 2019-10-10 DIAGNOSIS — L98499 Non-pressure chronic ulcer of skin of other sites with unspecified severity: Secondary | ICD-10-CM | POA: Diagnosis not present

## 2019-10-10 DIAGNOSIS — D485 Neoplasm of uncertain behavior of skin: Secondary | ICD-10-CM | POA: Diagnosis not present

## 2019-10-10 DIAGNOSIS — R238 Other skin changes: Secondary | ICD-10-CM | POA: Diagnosis not present

## 2019-10-11 ENCOUNTER — Encounter: Payer: Medicare HMO | Admitting: Occupational Therapy

## 2019-10-16 ENCOUNTER — Encounter: Payer: Medicare HMO | Admitting: Occupational Therapy

## 2019-10-16 DIAGNOSIS — M797 Fibromyalgia: Secondary | ICD-10-CM | POA: Diagnosis not present

## 2019-10-16 DIAGNOSIS — G894 Chronic pain syndrome: Secondary | ICD-10-CM | POA: Diagnosis not present

## 2019-10-18 ENCOUNTER — Encounter: Payer: Medicare HMO | Admitting: Occupational Therapy

## 2019-10-23 ENCOUNTER — Encounter: Payer: Medicare HMO | Admitting: Occupational Therapy

## 2019-10-24 DIAGNOSIS — E118 Type 2 diabetes mellitus with unspecified complications: Secondary | ICD-10-CM | POA: Diagnosis not present

## 2019-10-24 DIAGNOSIS — E785 Hyperlipidemia, unspecified: Secondary | ICD-10-CM | POA: Diagnosis not present

## 2019-10-24 DIAGNOSIS — E209 Hypoparathyroidism, unspecified: Secondary | ICD-10-CM | POA: Diagnosis not present

## 2019-10-24 DIAGNOSIS — E559 Vitamin D deficiency, unspecified: Secondary | ICD-10-CM | POA: Diagnosis not present

## 2019-10-24 DIAGNOSIS — E119 Type 2 diabetes mellitus without complications: Secondary | ICD-10-CM | POA: Diagnosis not present

## 2019-10-24 DIAGNOSIS — E1159 Type 2 diabetes mellitus with other circulatory complications: Secondary | ICD-10-CM | POA: Diagnosis not present

## 2019-10-24 DIAGNOSIS — I10 Essential (primary) hypertension: Secondary | ICD-10-CM | POA: Diagnosis not present

## 2019-10-24 DIAGNOSIS — E1169 Type 2 diabetes mellitus with other specified complication: Secondary | ICD-10-CM | POA: Diagnosis not present

## 2019-10-24 DIAGNOSIS — R300 Dysuria: Secondary | ICD-10-CM | POA: Diagnosis not present

## 2019-10-25 ENCOUNTER — Ambulatory Visit: Payer: Medicare HMO | Attending: Infectious Diseases | Admitting: Infectious Diseases

## 2019-10-25 ENCOUNTER — Other Ambulatory Visit: Payer: Self-pay

## 2019-10-25 ENCOUNTER — Other Ambulatory Visit
Admission: RE | Admit: 2019-10-25 | Discharge: 2019-10-25 | Disposition: A | Discharge status: Home / Self Care | Payer: Medicare HMO | Source: Ambulatory Visit | Attending: Infectious Diseases | Admitting: Infectious Diseases

## 2019-10-25 ENCOUNTER — Encounter: Payer: Self-pay | Admitting: Infectious Diseases

## 2019-10-25 ENCOUNTER — Encounter: Payer: Medicare HMO | Admitting: Occupational Therapy

## 2019-10-25 VITALS — BP 120/78 | HR 81 | Temp 98.7°F | Resp 16 | Ht 64.0 in | Wt 367.0 lb

## 2019-10-25 DIAGNOSIS — F419 Anxiety disorder, unspecified: Secondary | ICD-10-CM | POA: Insufficient documentation

## 2019-10-25 DIAGNOSIS — Z96642 Presence of left artificial hip joint: Secondary | ICD-10-CM | POA: Diagnosis not present

## 2019-10-25 DIAGNOSIS — M109 Gout, unspecified: Secondary | ICD-10-CM | POA: Diagnosis not present

## 2019-10-25 DIAGNOSIS — T148XXA Other injury of unspecified body region, initial encounter: Secondary | ICD-10-CM

## 2019-10-25 DIAGNOSIS — Z881 Allergy status to other antibiotic agents status: Secondary | ICD-10-CM

## 2019-10-25 DIAGNOSIS — L24A9 Irritant contact dermatitis due friction or contact with other specified body fluids: Secondary | ICD-10-CM

## 2019-10-25 DIAGNOSIS — M1611 Unilateral primary osteoarthritis, right hip: Secondary | ICD-10-CM

## 2019-10-25 DIAGNOSIS — Z886 Allergy status to analgesic agent status: Secondary | ICD-10-CM

## 2019-10-25 DIAGNOSIS — E119 Type 2 diabetes mellitus without complications: Secondary | ICD-10-CM | POA: Diagnosis not present

## 2019-10-25 DIAGNOSIS — M797 Fibromyalgia: Secondary | ICD-10-CM | POA: Insufficient documentation

## 2019-10-25 DIAGNOSIS — A311 Cutaneous mycobacterial infection: Secondary | ICD-10-CM

## 2019-10-25 DIAGNOSIS — R300 Dysuria: Secondary | ICD-10-CM

## 2019-10-25 DIAGNOSIS — Z79899 Other long term (current) drug therapy: Secondary | ICD-10-CM

## 2019-10-25 DIAGNOSIS — Z6841 Body Mass Index (BMI) 40.0 and over, adult: Secondary | ICD-10-CM | POA: Insufficient documentation

## 2019-10-25 DIAGNOSIS — I9789 Other postprocedural complications and disorders of the circulatory system, not elsewhere classified: Secondary | ICD-10-CM | POA: Diagnosis not present

## 2019-10-25 DIAGNOSIS — I890 Lymphedema, not elsewhere classified: Secondary | ICD-10-CM

## 2019-10-25 DIAGNOSIS — I10 Essential (primary) hypertension: Secondary | ICD-10-CM

## 2019-10-25 DIAGNOSIS — Z9884 Bariatric surgery status: Secondary | ICD-10-CM | POA: Diagnosis not present

## 2019-10-25 DIAGNOSIS — F329 Major depressive disorder, single episode, unspecified: Secondary | ICD-10-CM | POA: Diagnosis not present

## 2019-10-25 DIAGNOSIS — Z7984 Long term (current) use of oral hypoglycemic drugs: Secondary | ICD-10-CM

## 2019-10-25 DIAGNOSIS — Z888 Allergy status to other drugs, medicaments and biological substances status: Secondary | ICD-10-CM

## 2019-10-25 DIAGNOSIS — M793 Panniculitis, unspecified: Secondary | ICD-10-CM

## 2019-10-25 DIAGNOSIS — Z79890 Hormone replacement therapy (postmenopausal): Secondary | ICD-10-CM | POA: Diagnosis not present

## 2019-10-25 DIAGNOSIS — Z91013 Allergy to seafood: Secondary | ICD-10-CM

## 2019-10-25 DIAGNOSIS — E039 Hypothyroidism, unspecified: Secondary | ICD-10-CM | POA: Diagnosis not present

## 2019-10-25 DIAGNOSIS — B999 Unspecified infectious disease: Secondary | ICD-10-CM | POA: Diagnosis present

## 2019-10-25 DIAGNOSIS — S71109A Unspecified open wound, unspecified thigh, initial encounter: Secondary | ICD-10-CM

## 2019-10-25 DIAGNOSIS — K219 Gastro-esophageal reflux disease without esophagitis: Secondary | ICD-10-CM | POA: Insufficient documentation

## 2019-10-25 NOTE — Progress Notes (Signed)
NAME: Karen Lewis  DOB: [birth year]  MRN: [id number]  Date/Time: 10/25/2019 12:52 PM   Subjective:  Here for follow up after having skin biopsy The site is not closing and she wanted to make sure that there is no infection  Karen Lewis is a 49 y.o. 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 Pt suffers from lymphedema of legs and has been seeing vascular and is to get compression wraps and pumps soon.  She had Mycobacterium abscessus on the abdominal wall following lipectomy and was treated for nearly 4 months and those lesions have  completely healed  She recently started with indurated area of skin over the left thigh and rt inner thigh Nov 2020       She was given 2 courses of antibiotics ( augmentin/doxy) and it did not make any difference. She was sent to the dermatologist and Dr.Moye did a punch biopsy and sent for HPE, fungal, mycobacterial culture. Pathology came back as atypical vascular proliferation involving the dermis.   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 There was no calciphylaxis She is followed by the dermatologist Dr.Moye She is here today as the punch biopsy hole was discharging No fever  Has pain around         Past Medical History:  Diagnosis Date  . Anemia   . Anginal pain ([place])   . Anxiety   . Arthralgia of hip 07/29/2015  . Arthritis   . Arthritis, degenerative 07/29/2015  . Asthma   . Cephalalgia 07/25/2014  . Dependence on unknown drug ([place])    multiplt controlled drug dependence  . Depression   . Diabetes mellitus without complication ([place])   . Dysrhythmia   . Eczema   . Fibromyalgia   . Gastritis   . GERD (gastroesophageal reflux disease)   . Gonalgia 07/29/2015   Overview:  Overview:  The patient has had bilateral  intra-articular Hyalgan injections done on 07/16/2014 and although she seems to do well with this type of therapy, apparently her insurance company does not want to pay for they Hyalgan. On 11/27/2014 the patient underwent a bilateral genicular nerve block with excellent results. On 01/28/2015 she had a right knee genicular radiofrequency ablatio  . Gout   . H/O cardiovascular disorder 03/10/2015  . H/O surgical procedure 12/05/2012   Overview:  LSG ([facility] - April 2013)    . H/O thyroid disease 03/10/2015  . Headache   . Herpes   . History of artificial joint 07/29/2015  . History of hiatal hernia   . Hypertension   . Hypomagnesemia   . Hypothyroidism   . IDA (iron deficiency anemia) 05/28/2019  . LBP (low back pain) 07/29/2015  . Neuromuscular disorder ([place])   . Obesity   . PCOS (polycystic ovarian syndrome)   . Primary osteoarthritis of both knees 07/29/2015  . Sleep apnea   . Thyroid nodule bilateral    Past Surgical History:  Procedure Laterality Date  . APPENDECTOMY    . CARPAL TUNNEL RELEASE Bilateral   . CHOLECYSTECTOMY    . DIAGNOSTIC LAPAROSCOPY    . HIATAL HERNIA REPAIR    . JOINT REPLACEMENT Left hip  . LAPAROSCOPIC PARTIAL GASTRECTOMY    . left trigger finger    . peniculectomy N/A 07/05/2018  . ROUX-EN-Y GASTRIC BYPASS  06/03/2017  . SHOULDER  ARTHROSCOPY Right   . THYROIDECTOMY N/A 11/12/2015   Procedure: THYROIDECTOMY;  Surgeon: Clyde Canterbury, MD;  Location: [facility] ORS;  Service: ENT;  Laterality: N/A;  . TRIGGER FINGER RELEASE Right     Social History   Socioeconomic History  . Marital status: Significant Other    Spouse name: Not on file  . Number of children: Not on file  . Years of education: Not on file  . Highest education level: Not on file  Occupational History  . Not on file  Tobacco Use  . Smoking status: Never Smoker  . Smokeless tobacco: Never Used  Substance and Sexual Activity  . Alcohol use: No    Alcohol/week: 0.0 standard drinks  . Drug use:  No  . Sexual activity: Not Currently    Birth control/protection: Injection  Other Topics Concern  . Not on file  Social History Narrative  . Not on file   Social Determinants of Health   Financial Resource Strain:   . Difficulty of Paying Living Expenses: Not on file  Food Insecurity:   . Worried About Charity fundraiser in the Last Year: Not on file  . Ran Out of Food in the Last Year: Not on file  Transportation Needs:   . Lack of Transportation (Medical): Not on file  . Lack of Transportation (Non-Medical): Not on file  Physical Activity:   . Days of Exercise per Week: Not on file  . Minutes of Exercise per Session: Not on file  Stress:   . Feeling of Stress : Not on file  Social Connections:   . Frequency of Communication with Friends and Family: Not on file  . Frequency of Social Gatherings with Friends and Family: Not on file  . Attends Religious Services: Not on file  . Active Member of Clubs or Organizations: Not on file  . Attends Archivist Meetings: Not on file  . Marital Status: Not on file  Intimate Partner Violence:   . Fear of Current or Ex-Partner: Not on file  . Emotionally Abused: Not on file  . Physically Abused: Not on file  . Sexually Abused: Not on file    Family History  Problem Relation Age of Onset  . Anxiety disorder Mother   . Depression Mother   . Alcohol abuse Mother   . Diabetes Mother   . Hypertension Mother   . Kidney cancer Mother   . Sleep apnea Mother   . Alcohol abuse Father   . Anxiety disorder Father   . Depression Father   . Post-traumatic stress disorder Father   . Kidney failure Father   . COPD Father   . Diabetes Father   . Hypertension Father   . Sleep apnea Father   . Depression Brother   . Diabetes Brother   . Hypertension Brother   . Sleep apnea Brother   . Breast cancer Paternal Aunt   . Bladder Cancer Neg Hx   . Prostate cancer Neg Hx    Allergies  Allergen Reactions  . Bactrim  [Sulfamethoxazole-Trimethoprim] Hives  . Omalizumab Itching and Hives  . Ciprofloxacin Other (See Comments)    Muscle Pain Possible myalgias. myalgia Possible myalgias. myalgia  . Shellfish Allergy Other (See Comments)    unknown  . Sulfamethoxazole     Other reaction(s): Hives  . Trimethoprim Hives  . Aspirin Other (See Comments)    GI UPSET  . Clindamycin Rash  . Motrin [Ibuprofen]     Other reaction(s): Other (See  Comments) GI UPSET  . Nsaids Nausea Only and Other (See Comments)    Stomach upset Other reaction(s): Other (See Comments) Stomach upset  . Other Hives  . Sulfa Antibiotics Hives    ? Current Outpatient Medications  Medication Sig Dispense Refill  . ACCU-CHEK AVIVA PLUS test strip     . ACETAMINOPHEN PO Take 650 mg by mouth every 6 (six) hours as needed.    Marland Kitchen acyclovir (ZOVIRAX) 800 MG tablet Take 800 mg by mouth as needed. Reported on 11/12/2015    . albuterol (PROVENTIL HFA;VENTOLIN HFA) 108 (90 BASE) MCG/ACT inhaler Inhale 2 puffs into the lungs every 6 (six) hours as needed for wheezing or shortness of breath.    . Biotin 10 MG CAPS     . butalbital-acetaminophen-caffeine (FIORICET) 50-325-40 MG tablet     . calcium carbonate (TUMS - DOSED IN MG ELEMENTAL CALCIUM) 500 MG chewable tablet Chew 1 tablet by mouth daily.    . clonazePAM (KLONOPIN) 0.5 MG tablet Take 0.5 mg by mouth 3 (three) times daily as needed for anxiety.    . diclofenac sodium (VOLTAREN) 1 % GEL Apply 2 g topically 4 (four) times daily. 100 g 1  . diphenhydrAMINE (BENADRYL) 25 mg capsule Take 25 mg by mouth as needed.    . doxycycline (VIBRA-TABS) 100 MG tablet Take 1 tablet (100 mg total) by mouth 2 (two) times daily. 14 tablet 0  . Emollient (A + D PERSONAL CARE LOTION EX) Apply 1 application topically as needed.    Marland Kitchen EPINEPHrine 0.3 mg/0.3 mL IJ SOAJ injection Inject 0.3 mg into the skin as needed.    . ergocalciferol (VITAMIN D2) 1.25 MG (50000 UT) capsule Take by mouth.    . famotidine  (PEPCID) 20 MG tablet Take 20 mg by mouth 2 (two) times daily.    Marland Kitchen glucose blood (ACCU-CHEK AVIVA PLUS) test strip Use 2 (two) times daily    . ketoconazole (NIZORAL) 2 % cream APPLY AS DIRECTED TO AFFECTED AREA TWICE DAILY AS NEEDED    . levocetirizine (XYZAL) 5 MG tablet     . lisinopril (ZESTRIL) 5 MG tablet Take by mouth.    . magnesium oxide (MAG-OX) 400 MG tablet Three tabs every morning, two tabs every evening    . meclizine (ANTIVERT) 25 MG tablet     . medroxyPROGESTERone (DEPO-PROVERA) 150 MG/ML injection     . metFORMIN (GLUCOPHAGE) 1000 MG tablet Take 1,000 mg by mouth 2 (two) times daily with a meal.     . metoprolol tartrate (LOPRESSOR) 50 MG tablet Take 75 mg by mouth 2 (two) times daily.     . montelukast (SINGULAIR) 10 MG tablet Take 10 mg by mouth at bedtime.     . Multiple Vitamin (MULTI-VITAMIN) tablet Take 2 tablets by mouth daily.    . Multiple Vitamins-Minerals (TAB-A-VITE) TABS TAKE (2) TABLETS BY MOUTH ONCE DAILY.    . naloxone (NARCAN) nasal spray 4 mg/0.1 mL Spray into one nostril. Repeat with second device into other nostril after 2-3 minutes if no or minimal response. 1 kit 0  . nitrofurantoin, macrocrystal-monohydrate, (MACROBID) 100 MG capsule     . oxyCODONE (OXY IR/ROXICODONE) 5 MG immediate release tablet Take 1 tablet (5 mg total) by mouth 5 (five) times daily. Must last 30 days 150 tablet 0  . [START ON 11/06/2019] oxyCODONE (OXY IR/ROXICODONE) 5 MG immediate release tablet Take 1 tablet (5 mg total) by mouth 5 (five) times daily. Must last 30 days 150 tablet 0  .  pregabalin (LYRICA) 225 MG capsule Take 1 capsule (225 mg total) by mouth 2 (two) times daily. 60 capsule 5  . QUEtiapine (SEROQUEL) 100 MG tablet Take 100 mg by mouth at bedtime.    . rosuvastatin (CRESTOR) 40 MG tablet     . sertraline (ZOLOFT) 100 MG tablet Take 200 mg by mouth daily.     Marland Kitchen spironolactone (ALDACTONE) 25 MG tablet Take 37.5 mg by mouth 2 (two) times daily.     Marland Kitchen tiZANidine  (ZANAFLEX) 4 MG tablet Take 4 mg by mouth 2 (two) times daily as needed.     . traMADol (ULTRAM) 50 MG tablet     . triamcinolone (NASACORT AQ) 55 MCG/ACT AERO nasal inhaler 2 sprays by Nasal route 2 (two) times daily.    . vitamin C (ASCORBIC ACID) 500 MG tablet Take 500 mg by mouth daily.    Marland Kitchen zolpidem (AMBIEN) 10 MG tablet Take 10 mg by mouth at bedtime as needed for sleep.    Marland Kitchen zonisamide (ZONEGRAN) 50 MG capsule Take 150 mg by mouth daily. 3 tabs at bedtime    . levothyroxine (SYNTHROID) 125 MCG tablet Take 250 mcg by mouth.     . oxyCODONE (OXY IR/ROXICODONE) 5 MG immediate release tablet Take 1 tablet (5 mg total) by mouth 5 (five) times daily. Must last 30 days 150 tablet 0   No current facility-administered medications for this visit.     Abtx:  Anti-infectives (From admission, onward)   None      REVIEW OF SYSTEMS:  Const: negative fever, negative chills, negative weight loss Eyes: negative diplopia or visual changes, negative eye pain ENT: negative coryza, beginning of sore throat Resp: negative cough, hemoptysis, dyspnea Cards: negative for chest pain, palpitations, has lower extremity edema [id number] dysuria which resolves with cranberry juice GI: Negative for abdominal pain, occasional loose stool , no bleeding, constipation Skin: negative for rash and pruritus Heme: negative for easy bruising and gum/nose bleeding MS:Rt  hip pain, - THA date in [facility] Neurolo:negative for headaches, dizziness, vertigo, memory problems  Psych: Some anxiety Allergy/Immunology-medication allergy as noted above. Objective:  VITALS:  BP 120/78   Pulse 81   Temp 98.7 F (37.1 C)   Resp 16   Ht '5\' 4"'  (1.626 m)   Wt (!) 367 lb (166.5 kg)   SpO2 98%   BMI 63.00 kg/m  PHYSICAL EXAM:  General: Alert, cooperative, no distress, appears stated age.  In wheel chair- high BMI Abdomen: abdominal wall wounds have all healed     A new area of induration, erythema with central  darkening top of the left hip laterally     lympedema rt thigh area Peau de orange appearance   Today The biopsy site from left hip     Skin: No rashes or lesions. Or bruising Lymph: Cervical, supraclavicular normal. Neurologic: Grossly non-focal Pertinent Labs  ? Impression/Recommendation  Lymphedema with peau de orange appearance Biopsy from a site on the left thigh did not reveal calciphylaxis No granulomas seen- MTB culture pending BIOPSY site hole- culture taken  ? 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  Completely healed Had 4 months of treatment and completed end of oct/nov  Right hip arthritis.  Planning for surgery sometime in the future.  Left total hip arthroplasty.  Gastric bypass surgery  leading to weight loss necessitating panniculectomy. Complicated by Mycobacterium abscessus infection  Hypertension on metoprolol and lisinopril and spironolactone  Diabetes mellitus on metformin  Depression on multiple SSRIs.  Will contact her with culture results ___________________________________________________

## 2019-10-25 NOTE — Patient Instructions (Signed)
You are here as follow up after skin biopsy- the wound is open and has some drainage. Pathology is not indicative of infection or calciphylaxis- this could all be related to your lymphedema ( gave a copy of HPE) Today I have taken culture for bacteria and AFB Will contact you with results

## 2019-10-28 LAB — AEROBIC CULTURE W GRAM STAIN (SUPERFICIAL SPECIMEN)

## 2019-10-29 ENCOUNTER — Ambulatory Visit
Admission: RE | Admit: 2019-10-29 | Discharge: 2019-10-29 | Disposition: A | Discharge status: Home / Self Care | Payer: Medicare HMO | Source: Ambulatory Visit | Attending: Internal Medicine | Admitting: Internal Medicine

## 2019-10-29 ENCOUNTER — Other Ambulatory Visit: Payer: Self-pay | Admitting: Infectious Diseases

## 2019-10-29 ENCOUNTER — Other Ambulatory Visit: Payer: Self-pay

## 2019-10-29 DIAGNOSIS — Z1231 Encounter for screening mammogram for malignant neoplasm of breast: Secondary | ICD-10-CM | POA: Insufficient documentation

## 2019-10-29 MED ORDER — LEVOFLOXACIN 750 MG PO TABS: 750.0000 mg | ORAL_TABLET | Freq: Every day | ORAL | 0 refills | Status: DC

## 2019-10-29 NOTE — Progress Notes (Signed)
Pt has MRSA and pseudomonas in the punch biopsy site wound= both sensitive to quinolone She says she can take levaquin . Sent 750mg  Po Qd X 5 days

## 2019-10-30 DIAGNOSIS — H5213 Myopia, bilateral: Secondary | ICD-10-CM | POA: Diagnosis not present

## 2019-10-30 DIAGNOSIS — E119 Type 2 diabetes mellitus without complications: Secondary | ICD-10-CM | POA: Diagnosis not present

## 2019-10-30 DIAGNOSIS — H524 Presbyopia: Secondary | ICD-10-CM | POA: Diagnosis not present

## 2019-10-30 DIAGNOSIS — Z0100 Encounter for examination of eyes and vision without abnormal findings: Secondary | ICD-10-CM | POA: Diagnosis not present

## 2019-10-30 DIAGNOSIS — H532 Diplopia: Secondary | ICD-10-CM | POA: Diagnosis not present

## 2019-10-30 DIAGNOSIS — H52223 Regular astigmatism, bilateral: Secondary | ICD-10-CM | POA: Diagnosis not present

## 2019-10-30 DIAGNOSIS — Z7984 Long term (current) use of oral hypoglycemic drugs: Secondary | ICD-10-CM | POA: Diagnosis not present

## 2019-11-05 DIAGNOSIS — M65331 Trigger finger, right middle finger: Secondary | ICD-10-CM | POA: Diagnosis not present

## 2019-11-08 DIAGNOSIS — I890 Lymphedema, not elsewhere classified: Secondary | ICD-10-CM | POA: Diagnosis not present

## 2019-11-08 DIAGNOSIS — R21 Rash and other nonspecific skin eruption: Secondary | ICD-10-CM | POA: Diagnosis not present

## 2019-11-08 DIAGNOSIS — L98499 Non-pressure chronic ulcer of skin of other sites with unspecified severity: Secondary | ICD-10-CM | POA: Diagnosis not present

## 2019-11-09 DIAGNOSIS — L98499 Non-pressure chronic ulcer of skin of other sites with unspecified severity: Secondary | ICD-10-CM | POA: Diagnosis not present

## 2019-11-09 DIAGNOSIS — B9562 Methicillin resistant Staphylococcus aureus infection as the cause of diseases classified elsewhere: Secondary | ICD-10-CM | POA: Diagnosis not present

## 2019-11-12 ENCOUNTER — Other Ambulatory Visit: Payer: Self-pay | Admitting: Orthopedic Surgery

## 2019-11-13 DIAGNOSIS — G894 Chronic pain syndrome: Secondary | ICD-10-CM | POA: Diagnosis not present

## 2019-11-13 DIAGNOSIS — M797 Fibromyalgia: Secondary | ICD-10-CM | POA: Diagnosis not present

## 2019-11-15 DIAGNOSIS — Z6841 Body Mass Index (BMI) 40.0 and over, adult: Secondary | ICD-10-CM | POA: Diagnosis not present

## 2019-11-16 DIAGNOSIS — L89220 Pressure ulcer of left hip, unstageable: Secondary | ICD-10-CM | POA: Diagnosis not present

## 2019-11-20 ENCOUNTER — Other Ambulatory Visit: Payer: Self-pay

## 2019-11-20 ENCOUNTER — Encounter
Admission: RE | Admit: 2019-11-20 | Discharge: 2019-11-20 | Disposition: A | Discharge status: Home / Self Care | Payer: Medicare HMO | Source: Ambulatory Visit | Attending: Orthopedic Surgery | Admitting: Orthopedic Surgery

## 2019-11-20 DIAGNOSIS — Z01812 Encounter for preprocedural laboratory examination: Secondary | ICD-10-CM | POA: Diagnosis not present

## 2019-11-20 DIAGNOSIS — Z20822 Contact with and (suspected) exposure to covid-19: Secondary | ICD-10-CM | POA: Insufficient documentation

## 2019-11-20 DIAGNOSIS — Z01810 Encounter for preprocedural cardiovascular examination: Secondary | ICD-10-CM | POA: Diagnosis not present

## 2019-11-20 HISTORY — DX: Umbilical hernia without obstruction or gangrene: K42.9

## 2019-11-20 NOTE — Patient Instructions (Signed)
Your procedure is scheduled on: Tuesday November 27, 2019 Report to Day Surgery. To find out your arrival time please call [phone] between 1PM - 3PM on Monday November 26, 2019.  Remember: Instructions that are not followed completely may result in serious medical risk,  up to and including death, or upon the discretion of your surgeon and anesthesiologist your  surgery may need to be rescheduled.     _X__ 1. Do not eat food after midnight the night before your procedure.                 No gum chewing or hard candies. You may drink clear liquids up to 2 hours                 before you are scheduled to arrive for your surgery- DO not drink clear                 liquids within 2 hours of the start of your surgery.                 Clear Liquids include:  water, apple juice without pulp, clear Gatorade, G2 or                  Gatorade Zero (avoid Red/Purple/Blue), Black Coffee or Tea (Do not add                 anything to coffee or tea).  __X__2.  On the morning of surgery brush your teeth with toothpaste and water, you                may rinse your mouth with mouthwash if you wish.  Do not swallow any toothpaste of mouthwash.     _X__ 3.  No Alcohol for 24 hours before or after surgery.   _X__ 4.  Do Not Smoke or use e-cigarettes For 24 Hours Prior to Your Surgery.                 Do not use any chewable tobacco products for at least 6 hours prior to                 surgery.  __x__  5.  Notify your doctor if there is any change in your medical condition      (cold, fever, infections).     Do not wear jewelry, make-up, hairpins, clips or nail polish. Do not wear lotions, powders, or perfumes. You may wear deodorant. Do not shave 48 hours prior to surgery. Men may shave face and neck. Do not bring valuables to the hospital.    [facility] is not responsible for any belongings or valuables.  Contacts, dentures or bridgework may not be worn into surgery. Leave  your suitcase in the car. After surgery it may be brought to your room. For patients admitted to the hospital, discharge time is determined by your treatment team.   Patients discharged the day of surgery will not be allowed to drive home.   Make arrangements for someone to be with you for the first 24 hours of your Same Day Discharge.  ____ Take these medicines the morning of surgery with A SIP OF WATER:    1. metoprolol tartrate (LOPRESSOR)  2. levothyroxine (SYNTHROID)  3. omeprazole (PRILOSEC)   4. oxyCODONE (OXY IR/ROXICODONE)   5. acetaminophen (TYLENOL   6. pregabalin (LYRICA)  7. sertraline (ZOLOFT)  __x__ Use CHG Soap as directed  __x__ Use inhalers on  the day of surgery  albuterol (PROVENTIL HFA;VENTOLIN HFA)  __x__ Stop metformin 2 days prior to surgery (Last dose is Saturday evening)   __x__ Stop Anti-inflammatories such as ibuprofen, naproxen. Aleve, aspirin and or BC powders.   __x__ Stop supplements until after surgery.    __x__ Do not start any herbal supplements before your surgery.

## 2019-11-22 ENCOUNTER — Encounter
Admission: RE | Admit: 2019-11-22 | Discharge: 2019-11-22 | Disposition: A | Discharge status: Home / Self Care | Payer: Medicare HMO | Source: Ambulatory Visit | Attending: Orthopedic Surgery | Admitting: Orthopedic Surgery

## 2019-11-22 ENCOUNTER — Other Ambulatory Visit: Payer: Self-pay

## 2019-11-22 DIAGNOSIS — Z01810 Encounter for preprocedural cardiovascular examination: Secondary | ICD-10-CM | POA: Diagnosis not present

## 2019-11-22 DIAGNOSIS — Z20822 Contact with and (suspected) exposure to covid-19: Secondary | ICD-10-CM | POA: Diagnosis not present

## 2019-11-22 DIAGNOSIS — B9562 Methicillin resistant Staphylococcus aureus infection as the cause of diseases classified elsewhere: Secondary | ICD-10-CM | POA: Diagnosis not present

## 2019-11-22 DIAGNOSIS — Z01812 Encounter for preprocedural laboratory examination: Secondary | ICD-10-CM | POA: Diagnosis not present

## 2019-11-22 DIAGNOSIS — R05 Cough: Secondary | ICD-10-CM | POA: Diagnosis not present

## 2019-11-22 DIAGNOSIS — L98499 Non-pressure chronic ulcer of skin of other sites with unspecified severity: Secondary | ICD-10-CM | POA: Diagnosis not present

## 2019-11-22 LAB — URINALYSIS, ROUTINE W REFLEX MICROSCOPIC
Bilirubin Urine: NEGATIVE
Glucose, UA: NEGATIVE mg/dL
Hgb urine dipstick: NEGATIVE
Ketones, ur: NEGATIVE mg/dL
Leukocytes,Ua: NEGATIVE
Nitrite: NEGATIVE
Protein, ur: NEGATIVE mg/dL
Specific Gravity, Urine: 1.014 (ref 1.005–1.030)
pH: 7 (ref 5.0–8.0)

## 2019-11-22 LAB — CBC WITH DIFFERENTIAL/PLATELET
Abs Immature Granulocytes: 0.03 10*3/uL (ref 0.00–0.07)
Basophils Absolute: 0.1 10*3/uL (ref 0.0–0.1)
Basophils Relative: 1 %
Eosinophils Absolute: 0.3 10*3/uL (ref 0.0–0.5)
Eosinophils Relative: 3 %
HCT: 41.4 % (ref 36.0–46.0)
Hemoglobin: 13 g/dL (ref 12.0–15.0)
Immature Granulocytes: 0 %
Lymphocytes Relative: 23 %
Lymphs Abs: 2.2 10*3/uL (ref 0.7–4.0)
MCH: 27 pg (ref 26.0–34.0)
MCHC: 31.4 g/dL (ref 30.0–36.0)
MCV: 85.9 fL (ref 80.0–100.0)
Monocytes Absolute: 0.5 10*3/uL (ref 0.1–1.0)
Monocytes Relative: 5 %
Neutro Abs: 6.6 10*3/uL (ref 1.7–7.7)
Neutrophils Relative %: 68 %
Platelets: 317 10*3/uL (ref 150–400)
RBC: 4.82 MIL/uL (ref 3.87–5.11)
RDW: 15.2 % (ref 11.5–15.5)
WBC: 9.6 10*3/uL (ref 4.0–10.5)
nRBC: 0 % (ref 0.0–0.2)

## 2019-11-22 LAB — COMPREHENSIVE METABOLIC PANEL
ALT: 27 U/L (ref 0–44)
AST: 36 U/L (ref 15–41)
Albumin: 3.9 g/dL (ref 3.5–5.0)
Alkaline Phosphatase: 70 U/L (ref 38–126)
Anion gap: 6 (ref 5–15)
BUN: 13 mg/dL (ref 6–20)
CO2: 25 mmol/L (ref 22–32)
Calcium: 8.8 mg/dL — ABNORMAL LOW (ref 8.9–10.3)
Chloride: 105 mmol/L (ref 98–111)
Creatinine, Ser: 0.81 mg/dL (ref 0.44–1.00)
GFR calc Af Amer: >60 mL/min (ref >60)
GFR calc non Af Amer: >60 mL/min (ref >60)
Glucose, Bld: 163 mg/dL — ABNORMAL HIGH (ref 70–99)
Potassium: 4.6 mmol/L (ref 3.5–5.1)
Sodium: 136 mmol/L (ref 135–145)
Total Bilirubin: 0.5 mg/dL (ref 0.3–1.2)
Total Protein: 7.6 g/dL (ref 6.5–8.1)

## 2019-11-22 LAB — SARS CORONAVIRUS 2 (TAT 6-24 HRS): SARS Coronavirus 2: NEGATIVE

## 2019-11-22 LAB — SURGICAL PCR SCREEN
MRSA, PCR: NEGATIVE
Staphylococcus aureus: NEGATIVE

## 2019-11-22 LAB — TYPE AND SCREEN
ABO/RH(D): O POS
Antibody Screen: NEGATIVE

## 2019-11-23 ENCOUNTER — Other Ambulatory Visit: Payer: Medicare HMO

## 2019-11-27 ENCOUNTER — Inpatient Hospital Stay: Payer: Medicare HMO | Admitting: Certified Registered Nurse Anesthetist

## 2019-11-27 ENCOUNTER — Inpatient Hospital Stay
Admission: RE | Admit: 2019-11-27 | Discharge: 2019-12-05 | DRG: 470 | Disposition: A | Discharge status: Skilled Nursing Facility | Payer: Medicare HMO | Attending: Orthopedic Surgery | Admitting: Orthopedic Surgery

## 2019-11-27 ENCOUNTER — Encounter
Admission: RE | Disposition: A | Discharge status: Skilled Nursing Facility | Payer: Self-pay | Source: Home / Self Care | Attending: Orthopedic Surgery

## 2019-11-27 ENCOUNTER — Inpatient Hospital Stay: Payer: Medicare HMO

## 2019-11-27 ENCOUNTER — Other Ambulatory Visit: Payer: Self-pay

## 2019-11-27 ENCOUNTER — Encounter: Payer: Self-pay | Admitting: Orthopedic Surgery

## 2019-11-27 DIAGNOSIS — G4730 Sleep apnea, unspecified: Secondary | ICD-10-CM | POA: Diagnosis present

## 2019-11-27 DIAGNOSIS — E1169 Type 2 diabetes mellitus with other specified complication: Secondary | ICD-10-CM | POA: Diagnosis not present

## 2019-11-27 DIAGNOSIS — R509 Fever, unspecified: Secondary | ICD-10-CM | POA: Diagnosis not present

## 2019-11-27 DIAGNOSIS — E039 Hypothyroidism, unspecified: Secondary | ICD-10-CM | POA: Diagnosis present

## 2019-11-27 DIAGNOSIS — T8859XA Other complications of anesthesia, initial encounter: Secondary | ICD-10-CM | POA: Diagnosis not present

## 2019-11-27 DIAGNOSIS — Z8614 Personal history of Methicillin resistant Staphylococcus aureus infection: Secondary | ICD-10-CM | POA: Diagnosis not present

## 2019-11-27 DIAGNOSIS — Z96642 Presence of left artificial hip joint: Secondary | ICD-10-CM | POA: Diagnosis not present

## 2019-11-27 DIAGNOSIS — N289 Disorder of kidney and ureter, unspecified: Secondary | ICD-10-CM | POA: Diagnosis not present

## 2019-11-27 DIAGNOSIS — E119 Type 2 diabetes mellitus without complications: Secondary | ICD-10-CM | POA: Diagnosis not present

## 2019-11-27 DIAGNOSIS — Z91013 Allergy to seafood: Secondary | ICD-10-CM | POA: Diagnosis not present

## 2019-11-27 DIAGNOSIS — Z79891 Long term (current) use of opiate analgesic: Secondary | ICD-10-CM | POA: Diagnosis not present

## 2019-11-27 DIAGNOSIS — Z419 Encounter for procedure for purposes other than remedying health state, unspecified: Secondary | ICD-10-CM

## 2019-11-27 DIAGNOSIS — Z79890 Hormone replacement therapy (postmenopausal): Secondary | ICD-10-CM

## 2019-11-27 DIAGNOSIS — Z7984 Long term (current) use of oral hypoglycemic drugs: Secondary | ICD-10-CM

## 2019-11-27 DIAGNOSIS — M797 Fibromyalgia: Secondary | ICD-10-CM | POA: Diagnosis present

## 2019-11-27 DIAGNOSIS — G8918 Other acute postprocedural pain: Secondary | ICD-10-CM

## 2019-11-27 DIAGNOSIS — Z79899 Other long term (current) drug therapy: Secondary | ICD-10-CM

## 2019-11-27 DIAGNOSIS — W19XXXA Unspecified fall, initial encounter: Secondary | ICD-10-CM | POA: Diagnosis not present

## 2019-11-27 DIAGNOSIS — Y92239 Unspecified place in hospital as the place of occurrence of the external cause: Secondary | ICD-10-CM | POA: Diagnosis not present

## 2019-11-27 DIAGNOSIS — Z9884 Bariatric surgery status: Secondary | ICD-10-CM

## 2019-11-27 DIAGNOSIS — Z882 Allergy status to sulfonamides status: Secondary | ICD-10-CM

## 2019-11-27 DIAGNOSIS — Z96643 Presence of artificial hip joint, bilateral: Secondary | ICD-10-CM | POA: Diagnosis not present

## 2019-11-27 DIAGNOSIS — F19159 Other psychoactive substance abuse with psychoactive substance-induced psychotic disorder, unspecified: Secondary | ICD-10-CM | POA: Diagnosis not present

## 2019-11-27 DIAGNOSIS — G894 Chronic pain syndrome: Secondary | ICD-10-CM | POA: Diagnosis not present

## 2019-11-27 DIAGNOSIS — M6281 Muscle weakness (generalized): Secondary | ICD-10-CM | POA: Diagnosis not present

## 2019-11-27 DIAGNOSIS — M2550 Pain in unspecified joint: Secondary | ICD-10-CM | POA: Diagnosis not present

## 2019-11-27 DIAGNOSIS — I959 Hypotension, unspecified: Secondary | ICD-10-CM | POA: Diagnosis not present

## 2019-11-27 DIAGNOSIS — Z20822 Contact with and (suspected) exposure to covid-19: Secondary | ICD-10-CM | POA: Diagnosis not present

## 2019-11-27 DIAGNOSIS — E282 Polycystic ovarian syndrome: Secondary | ICD-10-CM | POA: Diagnosis not present

## 2019-11-27 DIAGNOSIS — M1611 Unilateral primary osteoarthritis, right hip: Secondary | ICD-10-CM | POA: Diagnosis not present

## 2019-11-27 DIAGNOSIS — E118 Type 2 diabetes mellitus with unspecified complications: Secondary | ICD-10-CM | POA: Diagnosis not present

## 2019-11-27 DIAGNOSIS — K219 Gastro-esophageal reflux disease without esophagitis: Secondary | ICD-10-CM | POA: Diagnosis not present

## 2019-11-27 DIAGNOSIS — G4440 Drug-induced headache, not elsewhere classified, not intractable: Secondary | ICD-10-CM | POA: Diagnosis not present

## 2019-11-27 DIAGNOSIS — E785 Hyperlipidemia, unspecified: Secondary | ICD-10-CM | POA: Diagnosis present

## 2019-11-27 DIAGNOSIS — R52 Pain, unspecified: Secondary | ICD-10-CM | POA: Diagnosis not present

## 2019-11-27 DIAGNOSIS — Z6841 Body Mass Index (BMI) 40.0 and over, adult: Secondary | ICD-10-CM

## 2019-11-27 DIAGNOSIS — Z881 Allergy status to other antibiotic agents status: Secondary | ICD-10-CM | POA: Diagnosis not present

## 2019-11-27 DIAGNOSIS — J45909 Unspecified asthma, uncomplicated: Secondary | ICD-10-CM | POA: Diagnosis not present

## 2019-11-27 DIAGNOSIS — G8929 Other chronic pain: Secondary | ICD-10-CM | POA: Diagnosis not present

## 2019-11-27 DIAGNOSIS — M25562 Pain in left knee: Secondary | ICD-10-CM | POA: Diagnosis not present

## 2019-11-27 DIAGNOSIS — M25551 Pain in right hip: Secondary | ICD-10-CM | POA: Diagnosis not present

## 2019-11-27 DIAGNOSIS — I10 Essential (primary) hypertension: Secondary | ICD-10-CM | POA: Diagnosis present

## 2019-11-27 DIAGNOSIS — F3289 Other specified depressive episodes: Secondary | ICD-10-CM | POA: Diagnosis not present

## 2019-11-27 DIAGNOSIS — Z96641 Presence of right artificial hip joint: Secondary | ICD-10-CM | POA: Diagnosis not present

## 2019-11-27 DIAGNOSIS — F419 Anxiety disorder, unspecified: Secondary | ICD-10-CM | POA: Diagnosis not present

## 2019-11-27 DIAGNOSIS — Z7401 Bed confinement status: Secondary | ICD-10-CM | POA: Diagnosis not present

## 2019-11-27 DIAGNOSIS — E569 Vitamin deficiency, unspecified: Secondary | ICD-10-CM | POA: Diagnosis not present

## 2019-11-27 DIAGNOSIS — M25561 Pain in right knee: Secondary | ICD-10-CM | POA: Diagnosis not present

## 2019-11-27 DIAGNOSIS — Z791 Long term (current) use of non-steroidal anti-inflammatories (NSAID): Secondary | ICD-10-CM

## 2019-11-27 DIAGNOSIS — M545 Low back pain: Secondary | ICD-10-CM | POA: Diagnosis not present

## 2019-11-27 DIAGNOSIS — F418 Other specified anxiety disorders: Secondary | ICD-10-CM | POA: Diagnosis not present

## 2019-11-27 DIAGNOSIS — Z471 Aftercare following joint replacement surgery: Secondary | ICD-10-CM | POA: Diagnosis not present

## 2019-11-27 HISTORY — PX: TOTAL HIP ARTHROPLASTY: SHX124

## 2019-11-27 LAB — CBC
HCT: 37.1 % (ref 36.0–46.0)
Hemoglobin: 11.8 g/dL — ABNORMAL LOW (ref 12.0–15.0)
MCH: 26.9 pg (ref 26.0–34.0)
MCHC: 31.8 g/dL (ref 30.0–36.0)
MCV: 84.7 fL (ref 80.0–100.0)
Platelets: 282 10*3/uL (ref 150–400)
RBC: 4.38 MIL/uL (ref 3.87–5.11)
RDW: 15.2 % (ref 11.5–15.5)
WBC: 14.7 10*3/uL — ABNORMAL HIGH (ref 4.0–10.5)
nRBC: 0 % (ref 0.0–0.2)

## 2019-11-27 LAB — GLUCOSE, CAPILLARY
Glucose-Capillary: 126 mg/dL — ABNORMAL HIGH (ref 70–99)
Glucose-Capillary: 144 mg/dL — ABNORMAL HIGH (ref 70–99)
Glucose-Capillary: 145 mg/dL — ABNORMAL HIGH (ref 70–99)
Glucose-Capillary: 168 mg/dL — ABNORMAL HIGH (ref 70–99)

## 2019-11-27 LAB — ABO/RH: ABO/RH(D): O POS

## 2019-11-27 LAB — CREATININE, SERUM
Creatinine, Ser: 0.91 mg/dL (ref 0.44–1.00)
GFR calc Af Amer: >60 mL/min (ref >60)
GFR calc non Af Amer: >60 mL/min (ref >60)

## 2019-11-27 LAB — POCT PREGNANCY, URINE: Preg Test, Ur: NEGATIVE

## 2019-11-27 LAB — HEMOGLOBIN A1C
Hgb A1c MFr Bld: 7.1 % — ABNORMAL HIGH (ref 4.8–5.6)
Mean Plasma Glucose: 157.07 mg/dL

## 2019-11-27 SURGERY — ARTHROPLASTY, HIP, TOTAL, ANTERIOR APPROACH
Anesthesia: Spinal | Site: Hip | Laterality: Right

## 2019-11-27 MED ORDER — PROPOFOL 500 MG/50ML IV EMUL
INTRAVENOUS | Status: DC | PRN
  Administered 2019-11-27: 125 ug/kg/min via INTRAVENOUS
  Administered 2019-11-27: 20 mg via INTRAVENOUS

## 2019-11-27 MED ORDER — SODIUM CHLORIDE 0.9 % IV SOLN: INTRAVENOUS | Status: DC

## 2019-11-27 MED ORDER — ZOLPIDEM TARTRATE 5 MG PO TABS
5.0000 mg | ORAL_TABLET | Freq: Every evening | ORAL | Status: DC | PRN
  Filled 2019-11-27: qty 1

## 2019-11-27 MED ORDER — ROSUVASTATIN CALCIUM 10 MG PO TABS
40.0000 mg | ORAL_TABLET | Freq: Every day | ORAL | Status: DC
  Administered 2019-11-27 – 2019-12-05 (×9): 40 mg via ORAL
  Filled 2019-11-27 (×9): qty 4

## 2019-11-27 MED ORDER — OXYCODONE HCL 5 MG PO TABS
5.0000 mg | ORAL_TABLET | ORAL | Status: DC | PRN
  Administered 2019-11-29 – 2019-12-02 (×3): 10 mg via ORAL
  Filled 2019-11-27: qty 1
  Filled 2019-11-27: qty 2
  Filled 2019-11-27: qty 1
  Filled 2019-11-27 (×2): qty 2
  Filled 2019-11-27: qty 1

## 2019-11-27 MED ORDER — INSULIN ASPART 100 UNIT/ML ~~LOC~~ SOLN
0.0000 [IU] | Freq: Three times a day (TID) | SUBCUTANEOUS | Status: DC
  Administered 2019-11-27: 3 [IU] via SUBCUTANEOUS
  Administered 2019-11-28 – 2019-11-30 (×8): 4 [IU] via SUBCUTANEOUS
  Administered 2019-11-30: 7 [IU] via SUBCUTANEOUS
  Administered 2019-12-01: 4 [IU] via SUBCUTANEOUS
  Administered 2019-12-01: 7 [IU] via SUBCUTANEOUS
  Administered 2019-12-02: 3 [IU] via SUBCUTANEOUS
  Administered 2019-12-02 (×2): 4 [IU] via SUBCUTANEOUS
  Administered 2019-12-03 – 2019-12-04 (×4): 3 [IU] via SUBCUTANEOUS
  Administered 2019-12-04 – 2019-12-05 (×2): 4 [IU] via SUBCUTANEOUS
  Filled 2019-11-27 (×22): qty 1

## 2019-11-27 MED ORDER — TRIAMCINOLONE ACETONIDE 55 MCG/ACT NA AERO
2.0000 | INHALATION_SPRAY | Freq: Two times a day (BID) | NASAL | Status: DC | PRN
  Filled 2019-11-27: qty 21.6

## 2019-11-27 MED ORDER — LISINOPRIL 5 MG PO TABS
5.0000 mg | ORAL_TABLET | Freq: Every day | ORAL | Status: DC
  Administered 2019-11-27 – 2019-12-02 (×4): 5 mg via ORAL
  Filled 2019-11-27 (×7): qty 1

## 2019-11-27 MED ORDER — NALOXONE HCL 4 MG/0.1ML NA LIQD
0.4000 mg | Freq: Once | NASAL | Status: DC | PRN
  Filled 2019-11-27: qty 8

## 2019-11-27 MED ORDER — BIOTIN 10 MG PO CAPS: 10.0000 mg | ORAL_CAPSULE | Freq: Every day | ORAL | Status: DC

## 2019-11-27 MED ORDER — PROPOFOL 500 MG/50ML IV EMUL
INTRAVENOUS | Status: AC
  Filled 2019-11-27: qty 50

## 2019-11-27 MED ORDER — METOPROLOL TARTRATE 50 MG PO TABS
75.0000 mg | ORAL_TABLET | Freq: Two times a day (BID) | ORAL | Status: DC
  Administered 2019-11-27 – 2019-12-05 (×12): 75 mg via ORAL
  Filled 2019-11-27 (×15): qty 1

## 2019-11-27 MED ORDER — LEVOCETIRIZINE DIHYDROCHLORIDE 5 MG PO TABS: 5.0000 mg | ORAL_TABLET | Freq: Every day | ORAL | Status: DC

## 2019-11-27 MED ORDER — NAPHAZOLINE-PHENIRAMINE 0.025-0.3 % OP SOLN
1.0000 [drp] | Freq: Four times a day (QID) | OPHTHALMIC | Status: DC | PRN
  Filled 2019-11-27: qty 5

## 2019-11-27 MED ORDER — ACETAMINOPHEN 10 MG/ML IV SOLN
INTRAVENOUS | Status: DC | PRN
  Administered 2019-11-27: 1000 mg via INTRAVENOUS

## 2019-11-27 MED ORDER — MIDAZOLAM HCL 5 MG/5ML IJ SOLN
INTRAMUSCULAR | Status: DC | PRN
  Administered 2019-11-27: 2 mg via INTRAVENOUS

## 2019-11-27 MED ORDER — QUETIAPINE FUMARATE 25 MG PO TABS
100.0000 mg | ORAL_TABLET | Freq: Every day | ORAL | Status: DC
  Administered 2019-11-27 – 2019-12-04 (×8): 100 mg via ORAL
  Filled 2019-11-27 (×8): qty 4

## 2019-11-27 MED ORDER — VITAMIN B-12 1000 MCG PO TABS
2500.0000 ug | ORAL_TABLET | Freq: Every day | ORAL | Status: DC
  Administered 2019-11-28 – 2019-12-05 (×8): 2500 ug via ORAL
  Filled 2019-11-27 (×8): qty 3

## 2019-11-27 MED ORDER — DOCUSATE SODIUM 100 MG PO CAPS
100.0000 mg | ORAL_CAPSULE | Freq: Two times a day (BID) | ORAL | Status: DC
  Administered 2019-11-28 – 2019-12-05 (×13): 100 mg via ORAL
  Filled 2019-11-27 (×16): qty 1

## 2019-11-27 MED ORDER — CALCIUM CARBONATE ANTACID 500 MG PO CHEW
1.0000 | CHEWABLE_TABLET | Freq: Every day | ORAL | Status: DC
  Administered 2019-11-28: 200 mg via ORAL
  Filled 2019-11-27: qty 1

## 2019-11-27 MED ORDER — METOCLOPRAMIDE HCL 5 MG/ML IJ SOLN: 5.0000 mg | Freq: Three times a day (TID) | INTRAMUSCULAR | Status: DC | PRN

## 2019-11-27 MED ORDER — MEDROXYPROGESTERONE ACETATE 150 MG/ML IM SUSP: 150.0000 mg | INTRAMUSCULAR | Status: DC

## 2019-11-27 MED ORDER — GENTAMICIN SULFATE 0.1 % EX OINT
1.0000 "application " | TOPICAL_OINTMENT | Freq: Every day | CUTANEOUS | Status: DC
  Administered 2019-11-28 – 2019-12-05 (×7): 1 via TOPICAL
  Filled 2019-11-27: qty 15

## 2019-11-27 MED ORDER — NEOMYCIN-POLYMYXIN B GU 40-200000 IR SOLN
Status: DC | PRN
  Administered 2019-11-27: 4 mL

## 2019-11-27 MED ORDER — PHENOL 1.4 % MT LIQD
1.0000 | OROMUCOSAL | Status: DC | PRN
  Filled 2019-11-27: qty 177

## 2019-11-27 MED ORDER — MENTHOL 3 MG MT LOZG
1.0000 | LOZENGE | OROMUCOSAL | Status: DC | PRN
  Filled 2019-11-27: qty 9

## 2019-11-27 MED ORDER — MIDAZOLAM HCL 2 MG/2ML IJ SOLN
INTRAMUSCULAR | Status: AC
  Filled 2019-11-27: qty 2

## 2019-11-27 MED ORDER — SODIUM CHLORIDE 0.9 % IV SOLN
INTRAVENOUS | Status: DC | PRN
  Administered 2019-11-27: 12:00:00 60 mL

## 2019-11-27 MED ORDER — BUTALBITAL-APAP-CAFFEINE 50-325-40 MG PO TABS
1.0000 | ORAL_TABLET | Freq: Four times a day (QID) | ORAL | Status: DC | PRN
  Filled 2019-11-27: qty 1

## 2019-11-27 MED ORDER — PREGABALIN 75 MG PO CAPS
225.0000 mg | ORAL_CAPSULE | Freq: Two times a day (BID) | ORAL | Status: DC
  Administered 2019-11-27 – 2019-12-05 (×16): 225 mg via ORAL
  Filled 2019-11-27 (×16): qty 3

## 2019-11-27 MED ORDER — DEXTROSE 5 % IV SOLN
3.0000 g | Freq: Four times a day (QID) | INTRAVENOUS | Status: AC
  Administered 2019-11-27 (×2): 3 g via INTRAVENOUS
  Filled 2019-11-27 (×2): qty 3

## 2019-11-27 MED ORDER — LEVOTHYROXINE SODIUM 50 MCG PO TABS
250.0000 ug | ORAL_TABLET | Freq: Every day | ORAL | Status: DC
  Administered 2019-11-28 – 2019-12-05 (×7): 250 ug via ORAL
  Filled 2019-11-27 (×8): qty 1

## 2019-11-27 MED ORDER — MEPERIDINE HCL 50 MG/ML IJ SOLN
INTRAMUSCULAR | Status: AC
  Filled 2019-11-27: qty 1

## 2019-11-27 MED ORDER — FENTANYL CITRATE (PF) 100 MCG/2ML IJ SOLN
25.0000 ug | INTRAMUSCULAR | Status: DC | PRN
  Administered 2019-11-27 (×2): 50 ug via INTRAVENOUS

## 2019-11-27 MED ORDER — ASCORBIC ACID 500 MG PO TABS
1000.0000 mg | ORAL_TABLET | Freq: Every day | ORAL | Status: DC
  Administered 2019-11-28 – 2019-12-05 (×8): 1000 mg via ORAL
  Filled 2019-11-27 (×8): qty 2

## 2019-11-27 MED ORDER — ALUM & MAG HYDROXIDE-SIMETH 200-200-20 MG/5ML PO SUSP: 30.0000 mL | ORAL | Status: DC | PRN

## 2019-11-27 MED ORDER — PROPOFOL 10 MG/ML IV BOLUS
INTRAVENOUS | Status: AC
  Filled 2019-11-27: qty 20

## 2019-11-27 MED ORDER — SERTRALINE HCL 100 MG PO TABS
200.0000 mg | ORAL_TABLET | Freq: Every day | ORAL | Status: DC
  Administered 2019-11-28 – 2019-12-05 (×8): 200 mg via ORAL
  Filled 2019-11-27: qty 4
  Filled 2019-11-27: qty 2
  Filled 2019-11-27: qty 4
  Filled 2019-11-27: qty 2
  Filled 2019-11-27 (×2): qty 4
  Filled 2019-11-27: qty 2
  Filled 2019-11-27 (×2): qty 4
  Filled 2019-11-27: qty 2

## 2019-11-27 MED ORDER — TRAMADOL HCL 50 MG PO TABS
50.0000 mg | ORAL_TABLET | Freq: Four times a day (QID) | ORAL | Status: DC
  Administered 2019-11-27 – 2019-12-02 (×13): 50 mg via ORAL
  Filled 2019-11-27 (×15): qty 1

## 2019-11-27 MED ORDER — ACETAMINOPHEN 500 MG PO TABS
1000.0000 mg | ORAL_TABLET | Freq: Four times a day (QID) | ORAL | Status: AC
  Administered 2019-11-27 – 2019-11-28 (×4): 1000 mg via ORAL
  Filled 2019-11-27 (×4): qty 2

## 2019-11-27 MED ORDER — ADULT MULTIVITAMIN W/MINERALS CH
2.0000 | ORAL_TABLET | Freq: Every day | ORAL | Status: DC
  Administered 2019-11-28 – 2019-12-02 (×5): 2 via ORAL
  Administered 2019-12-03: 1 via ORAL
  Administered 2019-12-04 – 2019-12-05 (×2): 2 via ORAL
  Filled 2019-11-27 (×8): qty 2

## 2019-11-27 MED ORDER — BISACODYL 10 MG RE SUPP: 10.0000 mg | Freq: Every day | RECTAL | Status: DC | PRN

## 2019-11-27 MED ORDER — METFORMIN HCL 500 MG PO TABS
1000.0000 mg | ORAL_TABLET | Freq: Two times a day (BID) | ORAL | Status: DC
  Administered 2019-11-27 – 2019-12-04 (×14): 1000 mg via ORAL
  Filled 2019-11-27 (×14): qty 2

## 2019-11-27 MED ORDER — HEPARIN SODIUM (PORCINE) 10000 UNIT/ML IJ SOLN
INTRAMUSCULAR | Status: AC
  Filled 2019-11-27: qty 3

## 2019-11-27 MED ORDER — CHLORHEXIDINE GLUCONATE 4 % EX LIQD: 60.0000 mL | Freq: Once | CUTANEOUS | Status: DC

## 2019-11-27 MED ORDER — OXYCODONE HCL ER 15 MG PO T12A
15.0000 mg | EXTENDED_RELEASE_TABLET | Freq: Two times a day (BID) | ORAL | Status: DC
  Administered 2019-11-27 – 2019-12-02 (×10): 15 mg via ORAL
  Filled 2019-11-27 (×10): qty 1

## 2019-11-27 MED ORDER — SODIUM CHLORIDE 0.9 % IV SOLN
INTRAVENOUS | Status: DC | PRN
  Administered 2019-11-27: 50 ug/min via INTRAVENOUS

## 2019-11-27 MED ORDER — CLONAZEPAM 0.5 MG PO TABS: 0.5000 mg | ORAL_TABLET | Freq: Three times a day (TID) | ORAL | Status: DC | PRN

## 2019-11-27 MED ORDER — MEPERIDINE HCL 50 MG/ML IJ SOLN
6.2500 mg | INTRAMUSCULAR | Status: DC | PRN
  Administered 2019-11-27: 6.25 mg via INTRAVENOUS

## 2019-11-27 MED ORDER — OXYCODONE HCL 5 MG PO TABS
10.0000 mg | ORAL_TABLET | ORAL | Status: DC | PRN
  Administered 2019-11-27 – 2019-11-29 (×4): 15 mg via ORAL
  Administered 2019-11-29: 10 mg via ORAL
  Administered 2019-11-29 – 2019-12-01 (×7): 15 mg via ORAL
  Filled 2019-11-27 (×8): qty 3
  Filled 2019-11-27: qty 2
  Filled 2019-11-27 (×3): qty 3

## 2019-11-27 MED ORDER — MECLIZINE HCL 25 MG PO TABS
25.0000 mg | ORAL_TABLET | Freq: Three times a day (TID) | ORAL | Status: DC | PRN
  Filled 2019-11-27: qty 1

## 2019-11-27 MED ORDER — MAGNESIUM CITRATE PO SOLN
1.0000 | Freq: Once | ORAL | Status: DC | PRN
  Filled 2019-11-27: qty 296

## 2019-11-27 MED ORDER — VITAMIN D (ERGOCALCIFEROL) 1.25 MG (50000 UNIT) PO CAPS
50000.0000 [IU] | ORAL_CAPSULE | ORAL | Status: DC
  Filled 2019-11-27: qty 1

## 2019-11-27 MED ORDER — FENTANYL CITRATE (PF) 100 MCG/2ML IJ SOLN
INTRAMUSCULAR | Status: DC | PRN
  Administered 2019-11-27 (×4): 25 ug via INTRAVENOUS

## 2019-11-27 MED ORDER — PANTOPRAZOLE SODIUM 40 MG PO TBEC
80.0000 mg | DELAYED_RELEASE_TABLET | Freq: Every day | ORAL | Status: DC
  Administered 2019-11-28 – 2019-12-05 (×8): 80 mg via ORAL
  Filled 2019-11-27 (×8): qty 2

## 2019-11-27 MED ORDER — FENTANYL CITRATE (PF) 100 MCG/2ML IJ SOLN
INTRAMUSCULAR | Status: AC
  Filled 2019-11-27: qty 2

## 2019-11-27 MED ORDER — ONDANSETRON HCL 4 MG PO TABS: 4.0000 mg | ORAL_TABLET | Freq: Four times a day (QID) | ORAL | Status: DC | PRN

## 2019-11-27 MED ORDER — RISAQUAD PO CAPS
1.0000 | ORAL_CAPSULE | Freq: Every day | ORAL | Status: DC
  Administered 2019-11-28 – 2019-12-05 (×8): 1 via ORAL
  Filled 2019-11-27 (×10): qty 1

## 2019-11-27 MED ORDER — BUPIVACAINE-EPINEPHRINE 0.25% -1:200000 IJ SOLN
INTRAMUSCULAR | Status: DC | PRN
  Administered 2019-11-27: 30 mL

## 2019-11-27 MED ORDER — ALBUTEROL SULFATE HFA 108 (90 BASE) MCG/ACT IN AERS: 2.0000 | INHALATION_SPRAY | Freq: Four times a day (QID) | RESPIRATORY_TRACT | Status: DC | PRN

## 2019-11-27 MED ORDER — ZONISAMIDE 25 MG PO CAPS
150.0000 mg | ORAL_CAPSULE | Freq: Every day | ORAL | Status: DC
  Administered 2019-11-27 – 2019-12-04 (×7): 150 mg via ORAL
  Filled 2019-11-27 (×10): qty 6

## 2019-11-27 MED ORDER — DIPHENHYDRAMINE HCL 25 MG PO CAPS
25.0000 mg | ORAL_CAPSULE | Freq: Three times a day (TID) | ORAL | Status: DC | PRN
  Administered 2019-11-29 – 2019-12-01 (×5): 25 mg via ORAL
  Filled 2019-11-27 (×5): qty 1

## 2019-11-27 MED ORDER — FAMOTIDINE 20 MG PO TABS
20.0000 mg | ORAL_TABLET | Freq: Every day | ORAL | Status: DC | PRN
  Administered 2019-11-29 – 2019-12-01 (×3): 20 mg via ORAL
  Filled 2019-11-27 (×3): qty 1

## 2019-11-27 MED ORDER — LIDOCAINE HCL URETHRAL/MUCOSAL 2 % EX GEL
1.0000 "application " | Freq: Two times a day (BID) | CUTANEOUS | Status: AC
  Administered 2019-11-27 – 2019-11-28 (×2): 1 via URETHRAL
  Filled 2019-11-27 (×2): qty 5

## 2019-11-27 MED ORDER — ACETAMINOPHEN 325 MG PO TABS
325.0000 mg | ORAL_TABLET | Freq: Four times a day (QID) | ORAL | Status: DC | PRN
  Administered 2019-11-29 – 2019-12-04 (×5): 650 mg via ORAL
  Filled 2019-11-27 (×6): qty 2

## 2019-11-27 MED ORDER — BUPIVACAINE HCL (PF) 0.5 % IJ SOLN
INTRAMUSCULAR | Status: DC | PRN
  Administered 2019-11-27: 3 mL

## 2019-11-27 MED ORDER — MAGNESIUM HYDROXIDE 400 MG/5ML PO SUSP: 30.0000 mL | Freq: Every day | ORAL | Status: DC | PRN

## 2019-11-27 MED ORDER — PHENYLEPHRINE HCL (PRESSORS) 10 MG/ML IV SOLN
INTRAVENOUS | Status: DC | PRN
  Administered 2019-11-27 (×2): 200 ug via INTRAVENOUS
  Administered 2019-11-27: 100 ug via INTRAVENOUS

## 2019-11-27 MED ORDER — MONTELUKAST SODIUM 10 MG PO TABS
10.0000 mg | ORAL_TABLET | Freq: Every day | ORAL | Status: DC
  Administered 2019-11-27 – 2019-12-04 (×8): 10 mg via ORAL
  Filled 2019-11-27 (×8): qty 1

## 2019-11-27 MED ORDER — TIZANIDINE HCL 4 MG PO TABS
4.0000 mg | ORAL_TABLET | Freq: Two times a day (BID) | ORAL | Status: DC | PRN
  Administered 2019-11-28 – 2019-12-01 (×6): 4 mg via ORAL
  Filled 2019-11-27 (×13): qty 1

## 2019-11-27 MED ORDER — HYDROMORPHONE HCL 1 MG/ML IJ SOLN
0.5000 mg | INTRAMUSCULAR | Status: DC | PRN
  Administered 2019-11-27: 1 mg via INTRAVENOUS
  Administered 2019-11-28: 0.5 mg via INTRAVENOUS
  Administered 2019-11-28 – 2019-11-29 (×3): 1 mg via INTRAVENOUS
  Filled 2019-11-27 (×5): qty 1

## 2019-11-27 MED ORDER — AMITRIPTYLINE HCL 25 MG PO TABS
100.0000 mg | ORAL_TABLET | Freq: Every day | ORAL | Status: DC
  Administered 2019-11-27 – 2019-12-04 (×8): 100 mg via ORAL
  Filled 2019-11-27 (×8): qty 4

## 2019-11-27 MED ORDER — ONDANSETRON HCL 4 MG/2ML IJ SOLN
4.0000 mg | Freq: Four times a day (QID) | INTRAMUSCULAR | Status: DC | PRN
  Administered 2019-12-02: 4 mg via INTRAVENOUS
  Filled 2019-11-27: qty 2

## 2019-11-27 MED ORDER — HEPARIN SODIUM (PORCINE) 10000 UNIT/ML IJ SOLN
INTRAMUSCULAR | Status: DC | PRN
  Administered 2019-11-27: 30000 [IU]

## 2019-11-27 MED ORDER — METOCLOPRAMIDE HCL 10 MG PO TABS: 5.0000 mg | ORAL_TABLET | Freq: Three times a day (TID) | ORAL | Status: DC | PRN

## 2019-11-27 MED ORDER — DEXTROSE 5 % IV SOLN
3.0000 g | INTRAVENOUS | Status: AC
  Administered 2019-11-27: 3 g via INTRAVENOUS
  Filled 2019-11-27: qty 3

## 2019-11-27 MED ORDER — ENOXAPARIN SODIUM 40 MG/0.4ML ~~LOC~~ SOLN
40.0000 mg | Freq: Two times a day (BID) | SUBCUTANEOUS | Status: DC
  Administered 2019-11-28 – 2019-12-05 (×15): 40 mg via SUBCUTANEOUS
  Filled 2019-11-27 (×15): qty 0.4

## 2019-11-27 MED ORDER — ONDANSETRON HCL 4 MG/2ML IJ SOLN
INTRAMUSCULAR | Status: DC | PRN
  Administered 2019-11-27: 4 mg via INTRAVENOUS

## 2019-11-27 SURGICAL SUPPLY — 71 items
BLADE SAGITTAL AGGR TOOTH XLG (BLADE) ×3 IMPLANT
BNDG COHESIVE 6X5 TAN STRL LF (GAUZE/BANDAGES/DRESSINGS) ×9 IMPLANT
CANISTER SUCT 1200ML W/VALVE (MISCELLANEOUS) ×3 IMPLANT
CANISTER WOUND CARE 500ML ATS (WOUND CARE) ×3 IMPLANT
CELL SAVER COLL SVCS (MISCELLANEOUS) ×3
CELL SAVER FILTER LIPID PALL S (MISCELLANEOUS) ×3
CHLORAPREP W/TINT 26 (MISCELLANEOUS) ×3 IMPLANT
COUNTER NEEDLE 20/40 LG (NEEDLE) ×2 IMPLANT
COVER BACK TABLE REUSABLE LG (DRAPES) ×3 IMPLANT
COVER WAND RF STERILE (DRAPES) ×3 IMPLANT
DRAPE 3/4 80X56 (DRAPES) ×9 IMPLANT
DRAPE C-ARM XRAY 36X54 (DRAPES) ×3 IMPLANT
DRAPE INCISE IOBAN 66X60 STRL (DRAPES) ×2 IMPLANT
DRAPE POUCH INSTRU U-SHP 10X18 (DRAPES) ×3 IMPLANT
DRESSING SURGICEL FIBRLLR 1X2 (HEMOSTASIS) ×2 IMPLANT
DRSG OPSITE POSTOP 4X8 (GAUZE/BANDAGES/DRESSINGS) ×2 IMPLANT
DRSG SURGICEL FIBRILLAR 1X2 (HEMOSTASIS) ×6
ELECT BLADE 6.5 EXT (BLADE) ×3 IMPLANT
ELECT REM PT RETURN 9FT ADLT (ELECTROSURGICAL) ×3
ELECTRODE REM PT RTRN 9FT ADLT (ELECTROSURGICAL) ×1 IMPLANT
FILTER LIPID PALL S CELL SAVER (MISCELLANEOUS) IMPLANT
GLOVE BIOGEL PI IND STRL 9 (GLOVE) ×1 IMPLANT
GLOVE BIOGEL PI INDICATOR 9 (GLOVE) ×2
GLOVE SURG SYN 9.0  PF PI (GLOVE) ×4
GLOVE SURG SYN 9.0 PF PI (GLOVE) ×2 IMPLANT
GOWN SRG 2XL LVL 4 RGLN SLV (GOWNS) ×1 IMPLANT
GOWN STRL NON-REIN 2XL LVL4 (GOWNS) ×2
GOWN STRL REUS W/ TWL LRG LVL3 (GOWN DISPOSABLE) ×1 IMPLANT
GOWN STRL REUS W/TWL LRG LVL3 (GOWN DISPOSABLE) ×2
HEAD FEMORAL 28MM SZ S (Head) ×2 IMPLANT
HEMOVAC 400CC 10FR (MISCELLANEOUS) ×2 IMPLANT
HIP DBL LINER 54X28 (Liner) ×2 IMPLANT
HOLDER FOLEY CATH W/STRAP (MISCELLANEOUS) ×3 IMPLANT
HOOD PEEL AWAY FLYTE STAYCOOL (MISCELLANEOUS) ×5 IMPLANT
IV NS 250ML (IV SOLUTION) ×2
IV NS 250ML BAXH (IV SOLUTION) IMPLANT
KIT PREVENA INCISION MGT 13 (CANNISTER) ×3 IMPLANT
MAT ABSORB  FLUID 56X50 GRAY (MISCELLANEOUS) ×2
MAT ABSORB FLUID 56X50 GRAY (MISCELLANEOUS) ×1 IMPLANT
NDL SAFETY ECLIPSE 18X1.5 (NEEDLE) ×1 IMPLANT
NDL SPNL 20GX3.5 QUINCKE YW (NEEDLE) ×2 IMPLANT
NEEDLE HYPO 18GX1.5 SHARP (NEEDLE) ×2
NEEDLE SPNL 20GX3.5 QUINCKE YW (NEEDLE) ×6 IMPLANT
NS IRRIG 1000ML POUR BTL (IV SOLUTION) ×3 IMPLANT
PACK HIP COMPR (MISCELLANEOUS) ×3 IMPLANT
PENCIL SMOKE EVACUATOR COATED (MISCELLANEOUS) ×3 IMPLANT
RETRACTOR YANK SUCT EIGR SABER (INSTRUMENTS) ×2 IMPLANT
SAVER CELL COLL SVCS (MISCELLANEOUS) IMPLANT
SCALPEL PROTECTED #10 DISP (BLADE) ×6 IMPLANT
SEALER BIPOLAR AQUA 6.0 (INSTRUMENTS) ×2 IMPLANT
SHELL ACETABULAR SZ 54 DM (Shell) ×2 IMPLANT
SOL PREP PVP 2OZ (MISCELLANEOUS)
SOLUTION PREP PVP 2OZ (MISCELLANEOUS) ×1 IMPLANT
SPONGE DRAIN TRACH 4X4 STRL 2S (GAUZE/BANDAGES/DRESSINGS) ×1 IMPLANT
STAPLER SKIN PROX 35W (STAPLE) ×3 IMPLANT
STEM FEMORAL SZ2 STD COLLARED (Stem) ×2 IMPLANT
STRAP SAFETY 5IN WIDE (MISCELLANEOUS) ×3 IMPLANT
SUT DVC 2 QUILL PDO  T11 36X36 (SUTURE) ×2
SUT DVC 2 QUILL PDO T11 36X36 (SUTURE) ×1 IMPLANT
SUT SILK 0 (SUTURE) ×2
SUT SILK 0 30XBRD TIE 6 (SUTURE) ×1 IMPLANT
SUT V-LOC 90 ABS DVC 3-0 CL (SUTURE) ×3 IMPLANT
SUT VIC AB 1 CT1 36 (SUTURE) ×5 IMPLANT
SYR 20ML LL LF (SYRINGE) ×3 IMPLANT
SYR 30ML LL (SYRINGE) ×3 IMPLANT
SYR 50ML LL SCALE MARK (SYRINGE) ×6 IMPLANT
SYR BULB IRRIG 60ML STRL (SYRINGE) ×3 IMPLANT
TAPE MICROFOAM 4IN (TAPE) ×3 IMPLANT
TOWEL OR 17X26 4PK STRL BLUE (TOWEL DISPOSABLE) ×3 IMPLANT
TRAY FOLEY MTR SLVR 16FR STAT (SET/KITS/TRAYS/PACK) ×3 IMPLANT
TUBE SUCT KAM VAC (TUBING) ×2 IMPLANT

## 2019-11-27 NOTE — Op Note (Signed)
11/27/2019  12:51 PM  PATIENT:  Karen Lewis  49 y.o. female  PRE-OPERATIVE DIAGNOSIS:  Primary localized osteoarthritis of right hip  POST-OPERATIVE DIAGNOSIS:  Primary localized osteoarthritis of right hip  PROCEDURE:  Procedure(s): TOTAL HIP ARTHROPLASTY ANTERIOR APPROACH (Right)  SURGEON: Laurene Footman, MD  ASSISTANTS: none  ANESTHESIA:   spinal  EBL:  Total I/O In: 1000 [I.V.:750; Blood:250] Out: 880 [Urine:380; Blood:500]  BLOOD ADMINISTERED:250 CC CELLSAVER  DRAINS: (2) Hemovact drain(s) in the subcutaneous  layer with  Suction Open   LOCAL MEDICATIONS USED:  MARCAINE    and OTHER Exparel  SPECIMEN:  Source of Specimen:  Right femoral head  DISPOSITION OF SPECIMEN:  PATHOLOGY  COUNTS:  YES  TOURNIQUET:  * No tourniquets in log *  IMPLANTS: Medacta AMIS 2 stem,Mpact 54 mm DM cup and liner with ceramic S 28 mm head  DICTATION: .Dragon Dictation   The patient was brought to the operating room and after spinal anesthesia was obtained patient was placed on the operative table with the ipsilateral foot into the Medacta attachment, contralateral leg on a well-padded table. C-arm was brought in and preop template x-ray taken. After prepping and draping in usual sterile fashion appropriate patient identification and timeout procedures were completed. Anterior approach to the hip was obtained and centered over the greater trochanter and TFL muscle. The subcutaneous tissue was incised hemostasis being achieved by electrocautery. TFL fascia was incised and the muscle retracted laterally deep retractor placed. The lateral femoral circumflex vessels were identified and ligated. The anterior capsule was exposed and a capsulotomy performed. The neck was identified and a femoral neck cut carried out with a saw. The head was removed without difficulty and showed sclerotic femoral head and acetabulum. Reaming was carried out to 52 mm and a 54 mm cup trial gave appropriate tightness to the  acetabular component a 54 DM cup was impacted into position. The leg was then externally rotated and ischiofemoral and pubofemoral releases carried out. The femur was sequentially broached to a size 2, size 2 standard with S head trials were placed and the final components chosen. The 2 standard stem was inserted along with a ceramic S 28 mm head and 54 mm liner. The hip was reduced and was stable the wound was thoroughly irrigated with fibrillar placed along the posterior capsule and medial neck. The deep fascia ws closed using a heavy Quill after infiltration of 30 cc of quarter percent Sensorcaine with epinephrine along with Exparel injected throughout the procedure, subcutaneous drain placed prior to skin closure .3-0 V-loc to close the skin with skin staples incisional wound VAC applied patient was sent to recovery in stable condition.   PLAN OF CARE: Admit to inpatient

## 2019-11-27 NOTE — Transfer of Care (Signed)
Immediate Anesthesia Transfer of Care Note  Patient: Karen Lewis  Procedure(s) Performed: TOTAL HIP ARTHROPLASTY ANTERIOR APPROACH (Right Hip)  Patient Location: PACU  Anesthesia Type:Spinal  Level of Consciousness: awake, alert  and oriented  Airway & Oxygen Therapy: Patient Spontanous Breathing and Patient connected to face mask oxygen  Post-op Assessment: Report given to RN and Post -op Vital signs reviewed and stable  Post vital signs: Reviewed and stable  Last Vitals:  Vitals Value Taken Time  BP 134/77 11/27/19 1253  Temp    Pulse 81 11/27/19 1255  Resp 22 11/27/19 1255  SpO2 100 % 11/27/19 1255  Vitals shown include unvalidated device data.  Last Pain:  Vitals:   11/27/19 0926  TempSrc: Tympanic  PainSc: 5          Complications: No apparent anesthesia complications

## 2019-11-27 NOTE — TOC Progression Note (Signed)
Transition of Care [facility]) - Progression Note    Patient Details  Name: Karen Lewis MRN: [id number] Date of Birth: [birth year]  Transition of Care [facility]) CM/SW [place], RN Phone Number: 11/27/2019, 3:20 PM  Clinical Narrative:     Requested the price of Lovenox will notify the patient once obtained       Expected Discharge Plan and Services                                                 Social Determinants of Health (SDOH) Interventions    Readmission Risk Interventions No flowsheet data found.

## 2019-11-27 NOTE — H&P (Signed)
Reviewed paper H+P, will be scanned into chart. No changes noted.  

## 2019-11-27 NOTE — Progress Notes (Signed)
PHARMACIST - PHYSICIAN ORDER COMMUNICATION  CONCERNING: P&T Medication Policy on Herbal Medications  DESCRIPTION:  This patient's order for:  Biotin capsules  has been noted.  This product(s) is classified as an "herbal" or natural product. Due to a lack of definitive safety studies or FDA approval, nonstandard manufacturing practices, plus the potential risk of unknown drug-drug interactions while on inpatient medications, the Pharmacy and Therapeutics Committee does not permit the use of "herbal" or natural products of this type within [facility].   ACTION TAKEN: The pharmacy department is unable to verify this order at this time and your patient has been informed of this safety policy. Please reevaluate patient's clinical condition at discharge and address if the herbal or natural product(s) should be resumed at that time.  Pearla Dubonnet, PharmD Clinical Pharmacist 11/27/2019 4:54 PM

## 2019-11-27 NOTE — Evaluation (Signed)
Physical Therapy Evaluation Patient Details Name: JALEE MCCLOY MRN: [id number] DOB: 14-May-1971 Today's Date: 11/27/2019   History of Present Illness  Per MD notes: Pt is a 49 y/o F with R hip OA s/p elective R THA.  PMH includes morbid obesity, HTN, DM, Depression, anxiety, asthma, fibromyalgia, L THA, R shoulder arthroplasty, bilat carpal tunnel release, GERD, cardiovascular disease, PCOS, and bilat knee OA.    Clinical Impression  Pt found anxious regarding prospect of mobilizing secondary to R hip pain 8/10.  Gentle encouragement provided with pt willing to participate with PT evaluation.  Pt was able to get to the sitting at the EOB with significant effort and time but did not require physical assistance.  Pt encouraged to attempt BLE weight bearing but ultimately declined to attempt secondary to R hip pain.  Pt required min A to get her RLE back into bed and put forth very good effort in general with bed mobility tasks.  Pt will benefit from PT services in a SNF setting upon discharge to safely address deficits listed in patient problem list for decreased caregiver assistance and eventual return to PLOF. Will follow pt closely for possible recommendation change based on progress in acute care.  Current recommendation based on pt's functional deficits as well as a history of recent falls and patient being only PWB to the RLE post surgery which may slow functional progress towards goals.      Follow Up Recommendations SNF    Equipment Recommendations  3in1 (PT)(Bariatric BSC)    Recommendations for Other Services       Precautions / Restrictions Precautions Precautions: Anterior Hip Restrictions Weight Bearing Restrictions: Yes RLE Weight Bearing: Partial weight bearing RLE Partial Weight Bearing Percentage or Pounds: 50%      Mobility  Bed Mobility Overal bed mobility: Needs Assistance Bed Mobility: Supine to Sit;Sit to Supine     Supine to sit: Supervision Sit to supine: Min  assist   General bed mobility comments: Extra time and effort and min A for RLE into bed during sit to sup  Transfers                 General transfer comment: NT per pt request secondary to RLE/hip pain  Ambulation/Gait             General Gait Details: NT  Stairs            Wheelchair Mobility    Modified Rankin (Stroke Patients Only)       Balance Overall balance assessment: Needs assistance   Sitting balance-Leahy Scale: Good                                       Pertinent Vitals/Pain Pain Assessment: 0-10 Pain Score: 8  Pain Location: [name] Descriptors / Indicators: Constant Pain Intervention(s): Patient requesting pain meds-RN notified;Premedicated before session;Monitored during session    Home Living Family/patient expects to be discharged to:: Private residence Living Arrangements: Spouse/significant other Available Help at Discharge: Family;Available 24 hours/day;Other (Comment)(Pt lives with fiance) Type of Home: House Home Access: Stairs to enter;Ramped entrance Entrance Stairs-Rails: None Entrance Stairs-Number of Steps: 1 Home Layout: One level Home Equipment: Walker - 2 wheels;Walker - 4 wheels;Electric scooter;Shower seat - built in      Prior Function Level of Independence: Independent with assistive device(s)         Comments: Mod Ind with  amb with a rollator HH distances and used an Transport planner in the community, 3 falls in the last 6 months secondary to LE weakness     Hand Dominance        Extremity/Trunk Assessment   Upper Extremity Assessment Upper Extremity Assessment: Generalized weakness    Lower Extremity Assessment Lower Extremity Assessment: Generalized weakness;RLE deficits/detail RLE Deficits / Details: BLE ankle DF/PF strength and AROM WNL RLE: Unable to fully assess due to pain RLE Sensation: WNL       Communication   Communication: No difficulties  Cognition  Arousal/Alertness: Awake/alert Behavior During Therapy: WFL for tasks assessed/performed Overall Cognitive Status: Within Functional Limits for tasks assessed                                        General Comments      Exercises Total Joint Exercises Ankle Circles/Pumps: AROM;Strengthening;Both;5 reps;10 reps Quad Sets: Strengthening;Both;5 reps;10 reps Gluteal Sets: Strengthening;Both;5 reps;10 reps Hip ABduction/ADduction: AAROM;Both;5 reps Long Arc Quad: AROM;Both;10 reps;Strengthening Knee Flexion: AROM;Both;10 reps;Strengthening Other Exercises Other Exercises: HEP education and review for BLE APs, QS, GS, and LAQs x 10 each every 1-2 hours daily   Assessment/Plan    PT Assessment Patient needs continued PT services  PT Problem List Decreased strength;Decreased activity tolerance;Decreased balance;Decreased mobility;Decreased knowledge of use of DME;Pain;Decreased knowledge of precautions       PT Treatment Interventions Gait training;Stair training;Functional mobility training;Therapeutic activities;Therapeutic exercise;DME instruction;Balance training;Patient/family education    PT Goals (Current goals can be found in the Care Plan section)  Acute Rehab PT Goals Patient Stated Goal: To be able to walk to the bathroom PT Goal Formulation: With patient Time For Goal Achievement: 12/10/19 Potential to Achieve Goals: Fair    Frequency BID   Barriers to discharge Inaccessible home environment      Co-evaluation               AM-PAC PT "6 Clicks" Mobility  Outcome Measure Help needed turning from your back to your side while in a flat bed without using bedrails?: A Little Help needed moving from lying on your back to sitting on the side of a flat bed without using bedrails?: A Little Help needed moving to and from a bed to a chair (including a wheelchair)?: A Lot Help needed standing up from a chair using your arms (e.g., wheelchair or bedside  chair)?: A Lot Help needed to walk in hospital room?: Total Help needed climbing 3-5 steps with a railing? : Total 6 Click Score: 12    End of Session   Activity Tolerance: Patient limited by pain Patient left: in bed;with call bell/phone within reach;with family/visitor present;with bed alarm set;with SCD's reapplied Nurse Communication: Mobility status PT Visit Diagnosis: Other abnormalities of gait and mobility (R26.89);Muscle weakness (generalized) (M62.81);Pain Pain - Right/Left: Right Pain - part of body: Hip;Leg    Time: [id number] PT Time Calculation (min) (ACUTE ONLY): 42 min   Charges:   PT Evaluation $PT Eval Moderate Complexity: 1 Mod PT Treatments $Therapeutic Exercise: 8-22 mins        D. Royetta Asal PT, DPT 11/27/19, 5:55 PM

## 2019-11-27 NOTE — Anesthesia Preprocedure Evaluation (Addendum)
Anesthesia Evaluation  Patient identified by MRN, date of birth, ID band Patient awake    Reviewed: Allergy & Precautions, H&P , NPO status , Patient's Chart, lab work & pertinent test results, reviewed documented beta blocker date and time   History of Anesthesia Complications Negative for: history of anesthetic complications  Airway Mallampati: III  TM Distance: >3 FB Neck ROM: full    Dental  (+) Teeth Intact   Pulmonary neg pulmonary ROS, neg shortness of breath, asthma , sleep apnea , neg recent URI,    Pulmonary exam normal        Cardiovascular hypertension, (-) angina(-) Past MI and (-) Cardiac Stents Normal cardiovascular exam+ dysrhythmias + Valvular Problems/Murmurs  Rhythm:regular Rate:Normal     Neuro/Psych  Headaches, neg Seizures PSYCHIATRIC DISORDERS Anxiety Depression  Neuromuscular disease (fibromyalgia)    GI/Hepatic negative GI ROS, Neg liver ROS, hiatal hernia, GERD  ,  Endo/Other  diabetesHypothyroidism Morbid obesity  Renal/GU negative Renal ROS  negative genitourinary   Musculoskeletal   Abdominal   Peds  Hematology negative hematology ROS (+) Blood dyscrasia, anemia ,   Anesthesia Other Findings Past Medical History:   Anginal pain (HCC)                                           Hypertension                                                 Dysrhythmia                                                  Asthma                                                       Sleep apnea                                                  Thyroid nodule                                  bilateral    Diabetes mellitus without complication (HCC)                 Depression                                                   Anxiety  GERD (gastroesophageal reflux disease)                       History of hiatal hernia                                     Headache                                                      Neuromuscular disorder (HCC)                                 Fibromyalgia                                                 Arthritis                                                    Anemia                                                       Obesity                                                      H/O cardiovascular disorder                     03/10/2015    H/O thyroid disease                             03/10/2015    H/O surgical procedure                          12/05/2012      Comment:Overview:  LSG ([facility] - April 2013)     Arthralgia of hip                               07/29/2015   Arthritis, degenerative                         07/29/2015   Gonalgia                                        07/29/2015     Comment:Overview:  Overview:  The patient has had  bilateral intra-articular Hyalgan injections               done on 07/16/2014 and although she seems to do              well with this type of therapy, apparently her               insurance company does not want to pay for they              Hyalgan. On 11/27/2014 the patient underwent a               bilateral genicular nerve block with excellent               results. On 01/28/2015 she had a right knee               genicular radiofrequency ablatio   Cephalalgia                                     07/25/2014   History of artificial joint                     07/29/2015   LBP (low back pain)                             07/29/2015   Primary osteoarthritis of both knees            07/29/2015   Dependence on unknown drug ([place])                               Comment:multiplt controlled drug dependence   Hypomagnesemia                                               Gout                                                         Herpes                                                       PCOS (polycystic ovarian syndrome)                           Eczema                                                        Hypothyroidism  Past Surgical History:   LAPAROSCOPIC PARTIAL GASTRECTOMY                              SHOULDER ARTHROSCOPY                            Right              JOINT REPLACEMENT                               Left hip          CARPAL TUNNEL RELEASE                           Bilateral              DIAGNOSTIC LAPAROSCOPY                                        CHOLECYSTECTOMY                                               TRIGGER FINGER RELEASE                          Right            BMI    Body Mass Index   71.88 kg/m 2     Reproductive/Obstetrics negative OB ROS                             Anesthesia Physical  Anesthesia Plan  ASA: III  Anesthesia Plan: Spinal   Post-op Pain Management:    Induction:   PONV Risk Score and Plan: 2 and Propofol infusion and TIVA  Airway Management Planned: Natural Airway and Simple Face Mask  Additional Equipment:   Intra-op Plan:   Post-operative Plan:   Informed Consent: I have reviewed the patients History and Physical, chart, labs and discussed the procedure including the risks, benefits and alternatives for the proposed anesthesia with the patient or authorized representative who has indicated his/her understanding and acceptance.     Dental Advisory Given  Plan Discussed with: CRNA  Anesthesia Plan Comments:        Anesthesia Quick Evaluation

## 2019-11-27 NOTE — Progress Notes (Signed)
Chaplain On-Call was paged by [place] with report of patient's request for prayer before her surgery. Chaplain visited at patient's bedside and provided spiritual and emotional support and prayer.  [place] Karen Lewis

## 2019-11-27 NOTE — Anesthesia Procedure Notes (Signed)
Spinal  Start time: 11/27/2019 10:22 AM End time: 11/27/2019 10:33 AM Staffing Performed: anesthesiologist  Preanesthetic Checklist Completed: patient identified, IV checked, site marked, risks and benefits discussed, surgical consent, monitors and equipment checked, pre-op evaluation and timeout performed Spinal Block Patient position: sitting Prep: ChloraPrep Patient monitoring: heart rate, continuous pulse ox and blood pressure Approach: midline Location: L3-4 Injection technique: single-shot Needle Needle type: [place]  Needle gauge: 22 G Needle length: 15 cm Assessment Sensory level: T8

## 2019-11-28 LAB — GLUCOSE, CAPILLARY
Glucose-Capillary: 161 mg/dL — ABNORMAL HIGH (ref 70–99)
Glucose-Capillary: 200 mg/dL — ABNORMAL HIGH (ref 70–99)
Glucose-Capillary: 171 mg/dL — ABNORMAL HIGH (ref 70–99)
Glucose-Capillary: 171 mg/dL — ABNORMAL HIGH (ref 70–99)

## 2019-11-28 LAB — BASIC METABOLIC PANEL
Anion gap: 9 (ref 5–15)
BUN: 15 mg/dL (ref 6–20)
CO2: 26 mmol/L (ref 22–32)
Calcium: 7.9 mg/dL — ABNORMAL LOW (ref 8.9–10.3)
Chloride: 103 mmol/L (ref 98–111)
Creatinine, Ser: 0.93 mg/dL (ref 0.44–1.00)
GFR calc Af Amer: >60 mL/min (ref >60)
GFR calc non Af Amer: >60 mL/min (ref >60)
Glucose, Bld: 168 mg/dL — ABNORMAL HIGH (ref 70–99)
Potassium: 4.4 mmol/L (ref 3.5–5.1)
Sodium: 138 mmol/L (ref 135–145)

## 2019-11-28 LAB — CBC
HCT: 36 % (ref 36.0–46.0)
Hemoglobin: 11.2 g/dL — ABNORMAL LOW (ref 12.0–15.0)
MCH: 26.6 pg (ref 26.0–34.0)
MCHC: 31.1 g/dL (ref 30.0–36.0)
MCV: 85.5 fL (ref 80.0–100.0)
Platelets: 261 10*3/uL (ref 150–400)
RBC: 4.21 MIL/uL (ref 3.87–5.11)
RDW: 15.2 % (ref 11.5–15.5)
WBC: 13.1 10*3/uL — ABNORMAL HIGH (ref 4.0–10.5)
nRBC: 0 % (ref 0.0–0.2)

## 2019-11-28 MED ORDER — MAGNESIUM OXIDE 400 (241.3 MG) MG PO TABS
800.0000 mg | ORAL_TABLET | Freq: Two times a day (BID) | ORAL | Status: DC
  Administered 2019-11-28 – 2019-12-05 (×15): 800 mg via ORAL
  Filled 2019-11-28 (×15): qty 2

## 2019-11-28 MED ORDER — IPRATROPIUM-ALBUTEROL 0.5-2.5 (3) MG/3ML IN SOLN
3.0000 mL | Freq: Four times a day (QID) | RESPIRATORY_TRACT | Status: DC | PRN
  Administered 2019-11-29 – 2019-12-03 (×3): 3 mL via RESPIRATORY_TRACT
  Filled 2019-11-28 (×4): qty 3

## 2019-11-28 MED ORDER — ALBUTEROL SULFATE HFA 108 (90 BASE) MCG/ACT IN AERS
1.0000 | INHALATION_SPRAY | RESPIRATORY_TRACT | Status: DC | PRN
  Filled 2019-11-28: qty 6.7

## 2019-11-28 MED ORDER — CALCIUM CARBONATE ANTACID 500 MG PO CHEW
1.0000 | CHEWABLE_TABLET | Freq: Three times a day (TID) | ORAL | Status: DC
  Administered 2019-11-28 – 2019-12-05 (×22): 200 mg via ORAL
  Filled 2019-11-28 (×22): qty 1

## 2019-11-28 MED ORDER — CHLORHEXIDINE GLUCONATE CLOTH 2 % EX PADS
6.0000 | MEDICATED_PAD | Freq: Every day | CUTANEOUS | Status: DC
  Administered 2019-11-29 – 2019-12-05 (×3): 6 via TOPICAL

## 2019-11-28 NOTE — Progress Notes (Signed)
Patient refused to have foley D/C'd. Charge nurse notified as well as PA verbally informed a few while ago. PA stated he will inform Dr Rudene Christians. Day RN notified of this information.

## 2019-11-28 NOTE — Anesthesia Postprocedure Evaluation (Signed)
Anesthesia Post Note  Patient: CHAVIE MACLEOD  Procedure(s) Performed: TOTAL HIP ARTHROPLASTY ANTERIOR APPROACH (Right Hip)  Patient location during evaluation: Nursing Unit Anesthesia Type: Spinal Level of consciousness: awake, awake and alert and oriented Pain management: pain level controlled Vital Signs Assessment: post-procedure vital signs reviewed and stable Respiratory status: spontaneous breathing, respiratory function stable and nonlabored ventilation Cardiovascular status: blood pressure returned to baseline and stable Postop Assessment: no headache Anesthetic complications: no Comments: C/O right buttocks pain/tingling raditing to right hip and down leg. Getting better than yesterday.     Last Vitals:  Vitals:   11/27/19 2258 11/28/19 0041  BP: (!) 134/114 114/70  Pulse: 99 93  Resp:  18  Temp:  37.4 C  SpO2:  96%    Last Pain:  Vitals:   11/28/19 0732  TempSrc:   PainSc: 6                  [employer]

## 2019-11-28 NOTE — Progress Notes (Signed)
Physical Therapy Treatment Patient Details Name: Karen Lewis MRN: [id number] DOB: [birth year] Today's Date: 11/28/2019    History of Present Illness Per MD notes: Pt is a 49 y/o F with R hip OA s/p elective R THA.  PMH includes morbid obesity, HTN, DM, Depression, anxiety, asthma, fibromyalgia, L THA, R shoulder arthroplasty, bilat carpal tunnel release, GERD, cardiovascular disease, PCOS, and bilat knee OA.    PT Comments    Pt pleasant and motivated to participate during the session but remained very limited functionally.  Pt required extensive time and effort with all functional tasks and frequently cried out in pain during the session.  Pt was able to come to standing this session but only with great effort and heavy lean on the walker with trunk flexed.  Pt unable to pick either LE off of the floor during amb attempts.  Once pt was back sitting at the EOB pt made multiple attempts to laterally scoot at the EOB but was ultimately unable to do so.  Pt will benefit from PT services in a SNF setting upon discharge to safely address deficits listed in patient problem list for decreased caregiver assistance and eventual return to PLOF.     Follow Up Recommendations  SNF     Equipment Recommendations  3in1 (PT);Other (comment)(Bariatric BSC)    Recommendations for Other Services       Precautions / Restrictions Precautions Precautions: Anterior Hip Restrictions Weight Bearing Restrictions: Yes RLE Weight Bearing: Partial weight bearing RLE Partial Weight Bearing Percentage or Pounds: 50%    Mobility  Bed Mobility Overal bed mobility: Needs Assistance Bed Mobility: Supine to Sit;Sit to Supine     Supine to sit: Supervision Sit to supine: Min assist   General bed mobility comments: Extra time and effort with sup to sit and min A for RLE into bed during sit to sup  Transfers Overall transfer level: Needs assistance Equipment used: Rolling walker (2 wheeled) Transfers: Sit  to/from Stand Sit to Stand: +2 safety/equipment;From elevated surface;Min assist         General transfer comment: Mod verbal cues for sequencing with pt requiring extensive time and effort as well as min A to stand  Ambulation/Gait             General Gait Details: Unable to advance either LE   Stairs             Wheelchair Mobility    Modified Rankin (Stroke Patients Only)       Balance Overall balance assessment: Needs assistance   Sitting balance-Leahy Scale: Good     Standing balance support: Bilateral upper extremity supported Standing balance-Leahy Scale: Fair Standing balance comment: Heavy lean on the RW in standing                            Cognition Arousal/Alertness: Awake/alert Behavior During Therapy: WFL for tasks assessed/performed Overall Cognitive Status: Within Functional Limits for tasks assessed                                        Exercises Total Joint Exercises Ankle Circles/Pumps: AROM;Strengthening;Both;10 reps Quad Sets: Strengthening;Both;10 reps Gluteal Sets: Strengthening;Both;10 reps Hip ABduction/ADduction: AAROM;Both;5 reps Long Arc Quad: AROM;Both;Strengthening;10 reps Knee Flexion: AROM;Both;10 reps;Strengthening Other Exercises Other Exercises: HEP review for BLE APs, QS, GS, and LAQs x 10 each every 1-2 hours  daily    General Comments        Pertinent Vitals/Pain Pain Assessment: 0-10 Pain Score: 7  Pain Location: [name] Descriptors / Indicators: Constant Pain Intervention(s): Premedicated before session;Monitored during session    Home Living                      Prior Function            PT Goals (current goals can now be found in the care plan section) Progress towards PT goals: Progressing toward goals    Frequency    BID      PT Plan Current plan remains appropriate    Co-evaluation              AM-PAC PT "6 Clicks" Mobility    Outcome Measure  Help needed turning from your back to your side while in a flat bed without using bedrails?: A Little Help needed moving from lying on your back to sitting on the side of a flat bed without using bedrails?: A Little Help needed moving to and from a bed to a chair (including a wheelchair)?: Total Help needed standing up from a chair using your arms (e.g., wheelchair or bedside chair)?: A Little Help needed to walk in hospital room?: Total Help needed climbing 3-5 steps with a railing? : Total 6 Click Score: 12    End of Session Equipment Utilized During Treatment: Gait belt Activity Tolerance: Patient limited by pain Patient left: in bed;with call bell/phone within reach;with family/visitor present;with bed alarm set;with SCD's reapplied Nurse Communication: Mobility status PT Visit Diagnosis: Other abnormalities of gait and mobility (R26.89);Muscle weakness (generalized) (M62.81);Pain Pain - Right/Left: Right Pain - part of body: Hip;Leg     Time: 1135-1200 PT Time Calculation (min) (ACUTE ONLY): 25 min  Charges:  $Therapeutic Exercise: 8-22 mins $Therapeutic Activity: 8-22 mins                     D. Scott Skyley [place] PT, DPT 11/28/19, 1:17 PM

## 2019-11-28 NOTE — Evaluation (Signed)
Occupational Therapy Evaluation Patient Details Name: Karen Lewis MRN: [id number] DOB: 1971/06/16 Today's Date: 11/28/2019    History of Present Illness Per MD notes: Pt is a 49 y/o F with R hip OA s/p elective R THA.  PMH includes morbid obesity, HTN, DM, Depression, anxiety, asthma, fibromyalgia, L THA, R shoulder arthroplasty, bilat carpal tunnel release, GERD, cardiovascular disease, PCOS, and bilat knee OA.   Clinical Impression   Karen Lewis was seen for OT evaluation this date, POD#1 from above surgery. Pt was modified independent in all ADLs prior to surgery. She endorses sitting to shower using a shower chair and handheld showerhead as well as a [id number] for household mobility. Pt has a motorized scooter that she uses for community mobility. She lives with her fiance and cat "Sam" in a 1 level house with a ramped entrance. Pt endorses at least 3 falls in the past six months, which she attributes to increased weakness and BLE pain during functional mobility. Pt and is eager to return to PLOF with less pain and improved safety and independence. Pt significanly pain limited during OT evaluation and politely declines any OOB/EOB activity despite education on early mobility after hip surgery. Pt able to don bilateral hospital socks while long sitting in bed. Pt likely to require increased level of assistance with more challenging garments/seated position such as sitting at EOB or from sit to stand due to pain and limited AROM of R hip. Pt instructed in self care skills, falls prevention strategies, home/routines modifications, DME/AE for LB bathing and dressing tasks, and pet management strategies. Pt would benefit from additional instruction in self care skills and techniques to help maintain precautions with or without assistive devices to support recall and carryover prior to discharge. Recommend STR upon hospital DC.      Follow Up Recommendations  SNF    Equipment Recommendations  3 in 1 bedside  commode    Recommendations for Other Services       Precautions / Restrictions Precautions Precautions: Anterior Hip Restrictions Weight Bearing Restrictions: Yes RLE Weight Bearing: Partial weight bearing RLE Partial Weight Bearing Percentage or Pounds: 50%      Mobility Bed Mobility Overal bed mobility: Needs Assistance Bed Mobility: Supine to Sit;Sit to Supine     Supine to sit: Supervision Sit to supine: Min assist   General bed mobility comments: Pt adamantly declines functional mobility this date. She is able to come to long sitting upright in bed from semi-reclined position mod I this date.  Transfers Overall transfer level: Needs assistance Equipment used: Rolling walker (2 wheeled) Transfers: Sit to/from Stand Sit to Stand: +2 safety/equipment;From elevated surface;Min assist         General transfer comment: Deferred at pt request. Per physical therapy notes from earlier this date, pt able to stand from elevated surface with min A, unable to attempt fxl mobility.    Balance Overall balance assessment: Needs assistance   Sitting balance-Leahy Scale: Good Sitting balance - Comments: Steady static and dynamic sitting in bed in log-sitting position.   Standing balance support: Bilateral upper extremity supported Standing balance-Leahy Scale: Fair Standing balance comment: Heavy lean on the RW in standing                           ADL either performed or assessed with clinical judgement   ADL Overall ADL's : Needs assistance/impaired  General ADL Comments: Pt politely but adamantly declines to trial OOB/EOB ADL activities this date. She dons hospital socks while long sitting upright in bed on BLE with increased time/effort to perform but otherwise no physical assist from therapist. She is functionally limited by pain and decreased AROM of her R hip this date. Will continue to assess.      Vision Baseline Vision/History: Wears glasses Wears Glasses: At all times Patient Visual Report: No change from baseline       Perception     Praxis      Pertinent Vitals/Pain Pain Assessment: 0-10 Pain Score: 7  Pain Location: [name] Descriptors / Indicators: Grimacing;Guarding;Sore;Sharp Pain Intervention(s): Limited activity within patient's tolerance;Monitored during session;Premedicated before session;Utilized relaxation techniques     Hand Dominance Right   Extremity/Trunk Assessment Upper Extremity Assessment Upper Extremity Assessment: Overall WFL for tasks assessed(BUE grossly WFL generally 4/5 t/o with Grip/FMC WNL.)   Lower Extremity Assessment Lower Extremity Assessment: Generalized weakness;RLE deficits/detail RLE Deficits / Details: s/p anterior THA. RLE: Unable to fully assess due to pain RLE Coordination: decreased gross motor   Cervical / Trunk Assessment Cervical / Trunk Assessment: Kyphotic   Communication Communication Communication: No difficulties   Cognition Arousal/Alertness: Awake/alert Behavior During Therapy: WFL for tasks assessed/performed Overall Cognitive Status: Within Functional Limits for tasks assessed                                 General Comments: Pt pleasant, conversational, A&O x4 during session.   General Comments       Exercises Total Joint Exercises Ankle Circles/Pumps: AROM;Strengthening;Both;10 reps Quad Sets: Strengthening;Both;10 reps Gluteal Sets: Strengthening;Both;10 reps Hip ABduction/ADduction: AAROM;Both;5 reps Long Arc Quad: AROM;Both;Strengthening;10 reps Knee Flexion: AROM;Both;10 reps;Strengthening Other Exercises Other Exercises: HEP review for BLE APs, QS, GS, and LAQs x 10 each every 1-2 hours daily Other Exercises: Pt educated in falls prevention strategies, safe use of AE/DME for LB ADL mgt, complementary alternative methods for pain magement, pet management strategies,  and routines modifications to support safety and functional independence upon hospital DC.   Shoulder Instructions      Home Living Family/patient expects to be discharged to:: Private residence Living Arrangements: Spouse/significant other Available Help at Discharge: Family;Available 24 hours/day;Other (Comment)(Pt lives with fiance) Type of Home: House Home Access: Stairs to enter;Ramped entrance Entrance Stairs-Number of Steps: 1 Entrance Stairs-Rails: None Home Layout: One level     Bathroom Shower/Tub: Occupational psychologist: Standard     Home Equipment: Emergency planning/management officer - 4 wheels;Walker - 2 wheels;Adaptive equipment;Hand held shower head Adaptive Equipment: Reacher Additional Comments: Pt reports shower seat is beginning to crack/wear out.      Prior Functioning/Environment Level of Independence: Independent with assistive device(s)        Comments: Mod Ind with amb with a rollator HH distances and used an Transport planner in the community, 3 falls in the last 6 months secondary to LE weakness. Pt reports tripping over llip of her ramp while wearing flip flops for 2/3 falls.        OT Problem List: Decreased strength;Decreased coordination;Pain;Obesity;Decreased activity tolerance;Decreased safety awareness;Decreased knowledge of use of DME or AE      OT Treatment/Interventions: Self-care/ADL training;Therapeutic exercise;Therapeutic activities;DME and/or AE instruction;Patient/family education;Balance training    OT Goals(Current goals can be found in the care plan section) Acute Rehab OT Goals Patient Stated Goal: To be able to walk to  the bathroom OT Goal Formulation: With patient Time For Goal Achievement: 12/12/19 Potential to Achieve Goals: Good ADL Goals Pt Will Perform Grooming: sitting;with modified independence;standing Pt Will Perform Lower Body Dressing: sit to/from stand;with adaptive equipment;with modified  independence Pt Will Transfer to Toilet: bedside commode;stand pivot transfer;with min assist(With LRAD PRN for improved safety and functional independence.) Pt Will Perform Toileting - Clothing Manipulation and hygiene: sit to/from stand;with set-up;with supervision;with adaptive equipment(With LRAD PRN for improved safety and functional independence.) Additional ADL Goal #1: Pt will independently verbalize a plan to implement at least 3 learned falls prevention strategies into her daily routines/home enviornment for improved safety and functional independence upon hospital DC.  OT Frequency: Min 2X/week   Barriers to D/C:            Co-evaluation              AM-PAC OT "6 Clicks" Daily Activity     Outcome Measure Help from another person eating meals?: None Help from another person taking care of personal grooming?: A Little Help from another person toileting, which includes using toliet, bedpan, or urinal?: A Lot Help from another person bathing (including washing, rinsing, drying)?: A Lot Help from another person to put on and taking off regular upper body clothing?: A Little Help from another person to put on and taking off regular lower body clothing?: A Lot 6 Click Score: 16   End of Session Nurse Communication: Mobility status  Activity Tolerance: Patient limited by pain Patient left: in bed;with call bell/phone within reach;with bed alarm set(Pt requested to remove SCDs during session. Educated on purpose and importance.)  OT Visit Diagnosis: Other abnormalities of gait and mobility (R26.89);Repeated falls (R29.6);Pain Pain - Right/Left: Right Pain - part of body: Hip;Knee;Leg                Time: [id number] OT Time Calculation (min): 28 min Charges:  OT General Charges $OT Visit: 1 Visit OT Evaluation $OT Eval Moderate Complexity: 1 Mod OT Treatments $Self Care/Home Management : 8-22 mins  Shara Blazing, M.S., OTR/L Ascom: [phone] 11/28/19, 2:27  PM

## 2019-11-28 NOTE — TOC Initial Note (Signed)
Transition of Care [facility]) - Initial/Assessment Note    Patient Details  Name: Karen Lewis MRN: [id number] Date of Birth: 1970/12/21  Transition of Care [facility]) CM/SW Contact:    Elease Hashimoto, LCSW Phone Number: 11/28/2019, 9:26 AM  Clinical Narrative:   Have met with pt who has dealt with multiple medical issues over the past four years. She has her fiance-[name] who assists her. He is disabled and able to provide 24 hr care if needed. Pt has been through this before with her other hip. She is aware of the PT recommendation and wants to plan to go home from here. Will continue to see how PT sessions go, pt seems to feel she will get better with her movement. She was not doing that much prior to admission due to her hip and knee issues. She was mobile and independent with self care, [name] helped with home management. Pt is a retired Therapist, sports. Has had [facility] in the past and is fine with them again. Will follow and see how she does today in PT session. Work on safe discharge plan for pt.         Expected Discharge Plan: [place] Barriers to Discharge: Continued Medical Work up   Patient Goals and CMS Choice Patient states their goals for this hospitalization and ongoing recovery are:: I plan to go home and have my fiance help me, like he has in the past      Expected Discharge Plan and Services Expected Discharge Plan: [place] In-house Referral: Clinical Social Work     Living arrangements for the past 2 months: Single Family Home                                      Prior Living Arrangements/Services Living arrangements for the past 2 months: Single Family Home Lives with:: Significant Other          Need for Family Participation in Patient Care: Yes (Comment) Care giver support system in place?: Yes (comment) Current home services: DME(has rw and tub seat)    Activities of Daily Living Home Assistive Devices/Equipment: Environmental consultant  (specify type), Shower chair with back, Reacher, Eyeglasses, Electric scooter, CBG Meter ADL Screening (condition at time of admission) Patient's cognitive ability adequate to safely complete daily activities?: Yes Is the patient deaf or have difficulty hearing?: No Does the patient have difficulty seeing, even when wearing glasses/contacts?: No Does the patient have difficulty concentrating, remembering, or making decisions?: No Patient able to express need for assistance with ADLs?: Yes Does the patient have difficulty dressing or bathing?: No Independently performs ADLs?: Yes (appropriate for developmental age) Does the patient have difficulty walking or climbing stairs?: No Weakness of Legs: None Weakness of Arms/Hands: None  Permission Sought/Granted   Permission granted to share information with : Yes, Verbal Permission Granted  Share Information with NAME: [name]  Permission granted to share info w AGENCY: [facility]  Permission granted to share info w Relationship: fiance  Permission granted to share info w Contact Information: [name]  Emotional Assessment Appearance:: Appears stated age Attitude/Demeanor/Rapport: Engaged, Gracious Affect (typically observed): Adaptable, Accepting Orientation: : Oriented to Self, Oriented to Place, Oriented to  Time, Oriented to Situation      Admission diagnosis:  Status post total hip replacement, right [B33.832] Patient Active Problem List   Diagnosis Date Noted  . Status post total  hip replacement, right 11/27/2019  . Lymphedema 08/20/2019  . Type II diabetes mellitus with complication ([place]) 65/79/0383  . Atypical chest pain 08/13/2019  . Heart palpitations 08/13/2019  . Leg swelling 07/15/2019  . Elevated LFTs 06/07/2019  . Edema of both legs 06/04/2019  . Hypocalcemia 06/04/2019  . Adult failure to thrive syndrome 06/03/2019  . IDA (iron deficiency anemia) 05/28/2019  . Mycobacterium abscessus infection 02/22/2019  . Surgical  wound, non healing 09/27/2018  . Wound infection after surgery 08/11/2018  . Panniculitis 07/05/2018  . Allergy to iodine 04/26/2018  . H/O allergy to shellfish 04/26/2018  . Encounter for dental examination 02/21/2018  . Spondylosis without myelopathy or radiculopathy, lumbosacral region 02/21/2018  . History of gastric bypass 02/15/2018  . Depression 01/19/2018  . Secondary Osteoarthritis of knee (Bilateral) (R>L) 01/02/2018  . Encounter for dental exam and cleaning w/o abnormal findings 12/19/2017  . Post op infection 12/05/2017  . Osteoarthritis of hip (Right) 07/07/2017  . Osteoarthritis of shoulder (Left) 05/31/2017  . Upper extremity pain 04/21/2017  . Chronic shoulder pain (Left) 04/21/2017  . Chronic pain syndrome 12/02/2016  . Neurogenic pain 12/02/2016  . Chest pain with low risk of acute coronary syndrome 08/04/2016  . SOB (shortness of breath) on exertion 08/04/2016  . Dysphagia, unspecified 05/04/2016  . Gastroesophageal reflux disease without esophagitis 05/04/2016  . Vitamin D insufficiency 11/27/2015  . Nontoxic goiter 11/12/2015  . Other psychoactive substance dependence, uncomplicated ([place]) 33/83/2919  . Controlled drug dependence ([place]) 10/13/2015  . Lumbar spondylosis 09/18/2015  . Hypomagnesemia 08/27/2015  . Morbid obesity with body mass index (BMI) greater than or equal to 70 in adult [facility]) 08/27/2015  . Long term current use of opiate analgesic 07/29/2015  . Long term prescription opiate use 07/29/2015  . Opiate use 07/29/2015  . Encounter for therapeutic drug level monitoring 07/29/2015  . Opiate dependence ([place]) 07/29/2015  . Chronic knee pain (Primary Area of Pain) (Bilateral) (R>L) 07/29/2015  . Chronic low back pain (Third area of Pain) (Bilateral) (R>L) 07/29/2015  . Lumbar facet syndrome (Bilateral) (R>L) 07/29/2015  . Osteoarthritis 07/29/2015  . Grade 1 (1.4 cm) Anterolisthesis of L4 over L5 07/29/2015  . Chronic hip pain (Secondary area of  Pain) (Right) 07/29/2015  . S/P THR (total hip replacement) (Left) 07/29/2015  . History of methicillin resistant staphylococcus aureus (MRSA) 07/29/2015  . History of bariatric surgery 07/29/2015  . History of methicillin resistant Staphylococcus aureus infection 07/29/2015  . Eczema 05/21/2015  . Gout 05/21/2015  . Headache, migraine 05/21/2015  . Bilateral polycystic ovarian syndrome 05/21/2015  . Disease of thyroid gland 05/21/2015  . Major depressive disorder, single episode 05/21/2015  . Dermatitis 05/21/2015  . Obstructive apnea 04/30/2015  . History of asthma 03/10/2015  . Binge eating disorder 03/10/2015  . Fibromyalgia 03/10/2015  . Anxiety, generalized 03/10/2015  . Insomnia, persistent 03/10/2015  . Depression, major, recurrent, moderate ([place]) 03/10/2015  . Avitaminosis D 04/30/2014  . Hyperlipidemia 04/25/2014  . Hyperlipidemia due to type 2 diabetes mellitus ([place]) 04/25/2014  . Uncomplicated asthma 16/60/6004  . Asthma 03/19/2014  . Goiter, nontoxic, multinodular 10/09/2013  . Apnea, sleep 12/05/2012  . History of surgical procedure 12/05/2012  . Type 2 diabetes mellitus without complication, without long-term current use of insulin ([place]) 12/05/2012  . Hypertension associated with diabetes ([place]) 12/05/2012   PCP:  Idelle Crouch, MD Pharmacy:   [place], [place] - [address] [place] [address] Phone: [phone] Fax:  [phone]     Social Determinants of Health (SDOH) Interventions    Readmission Risk Interventions No flowsheet data found.

## 2019-11-28 NOTE — Progress Notes (Signed)
Physical Therapy Treatment Patient Details Name: [name] MRN: [id number] DOB: August 01, 1971 Today's Date: 11/28/2019    History of Present Illness Per MD notes: Pt is a 49 y/o F with R hip OA s/p elective R THA.  PMH includes morbid obesity, HTN, DM, Depression, anxiety, asthma, fibromyalgia, L THA, R shoulder arthroplasty, bilat carpal tunnel release, GERD, cardiovascular disease, PCOS, and bilat knee OA.    PT Comments    Pt was agreeable to therapy this afternoon but presented with pain that very much limited her ability to participate t/o the session.  Pt was not able to stand up today secondary to pt unwillingness secondary to pain. Pt required physical assistance with RLE management during bed mobility and was unable to sit up EOB on her own. Pt will benefit from PT services in a SNF setting upon discharge to safely address deficits listed in patient problem list for decreased caregiver assistance and eventual return to PLOF.    Follow Up Recommendations  SNF     Equipment Recommendations  3in1 (PT);Other (comment)(Bariatric BSC)    Recommendations for Other Services       Precautions / Restrictions Precautions Precautions: Anterior Hip Restrictions Weight Bearing Restrictions: Yes RLE Weight Bearing: Partial weight bearing   Mobility  Bed Mobility Overal bed mobility: Needs Assistance Bed Mobility: Supine to Sit;Sit to Supine     Supine to sit: Min assist Sit to supine: Min assist   General bed mobility comments: Pt was unable to achieve sitting position EOB without physical assistance and had a failed attempt trying to do so. With physical assistance for RLE management, pt was able to sit EOB from supine  Transfers Overall transfer level: Needs assistance Equipment used: Rolling walker (2 wheeled)             General transfer comment: pt deferred this afternoon secondary to pain  Ambulation/Gait                 Stairs              Wheelchair Mobility    Modified Rankin (Stroke Patients Only)       Balance Overall balance assessment: Needs assistance Sitting-balance support: Feet unsupported;No upper extremity supported Sitting balance-Leahy Scale: Good Sitting balance - Comments: able to maintainn upright sitting posture wihtout UE support                                    Cognition Arousal/Alertness: Awake/alert Behavior During Therapy: WFL for tasks assessed/performed Overall Cognitive Status: Within Functional Limits for tasks assessed                                 General Comments: Pt pleasant, conversational, A&O x4 during session.      Exercises Total Joint Exercises Ankle Circles/Pumps: AROM;Strengthening;Both;10 reps Quad Sets: Strengthening;Both;10 reps Gluteal Sets: Strengthening;Both;10 reps Hip ABduction/ADduction: AAROM;Both;10 reps Straight Leg Raises: AAROM;Both;10 reps Long Arc Quad: AROM;Both;Strengthening;10 reps Other Exercises    General Comments        Pertinent Vitals/Pain Pain Assessment: 0-10 Pain Score: 9  Pain Location: [name] Descriptors / Indicators: Grimacing;Guarding;Sore;Sharp;Crying;Moaning;Constant Pain Intervention(s): Limited activity within patient's tolerance;Premedicated before session;Monitored during session;Patient requesting pain meds-RN notified    Home Living Family/patient expects to be discharged to:: Private residence Living Arrangements: Spouse/significant other Available Help at Discharge: Family;Available 24 hours/day;Other (  Comment)(Pt lives with fiance) Type of Home: House Home Access: Stairs to enter;Ramped entrance Entrance Stairs-Rails: None Home Layout: One level Home Equipment: Emergency planning/management officer - 4 wheels;Walker - 2 wheels;Adaptive equipment;Hand held shower head Additional Comments: Pt reports shower seat is beginning to crack/wear out.    Prior Function Level of  Independence: Independent with assistive device(s)         PT Goals (current goals can now be found in the care plan section) Acute Rehab PT Goals Progress towards PT goals: Progressing toward goals    Frequency    BID      PT Plan Current plan remains appropriate    Co-evaluation              AM-PAC PT "6 Clicks" Mobility   Outcome Measure  Help needed turning from your back to your side while in a flat bed without using bedrails?: A Little Help needed moving from lying on your back to sitting on the side of a flat bed without using bedrails?: A Little Help needed moving to and from a bed to a chair (including a wheelchair)?: Total Help needed standing up from a chair using your arms (e.g., wheelchair or bedside chair)?: A Little Help needed to walk in hospital room?: Total Help needed climbing 3-5 steps with a railing? : Total 6 Click Score: 12    End of Session   Activity Tolerance: Patient limited by pain Patient left: in bed;with call bell/phone within reach;with bed alarm set;with SCD's reapplied Nurse Communication: Mobility status PT Visit Diagnosis: Other abnormalities of gait and mobility (R26.89);Muscle weakness (generalized) (M62.81);Pain Pain - Right/Left: Right Pain - part of body: Hip;Leg     Time: [id number] PT Time Calculation (min) (ACUTE ONLY): 23 min  Charges:                       Annabelle Harman, SPT 11/28/19 5:32 PM

## 2019-11-28 NOTE — TOC Benefit Eligibility Note (Signed)
Transition of Care [facility]) Benefit Eligibility Note    Patient Details  Name: Karen Lewis MRN: [id number] Date of Birth: September 27, 1970   Medication/Dose: Enoxaparin 40 mg  Covered?: Yes  Prescription Coverage Preferred Pharmacy: [name], or [name] with Person/Company/Phone Number:: [employer] Medicare  Co-Pay: $58.24 for 30 day retail  Prior Approval: No    [name] Phone Number: 11/28/2019, 9:59 AM

## 2019-11-28 NOTE — Progress Notes (Signed)
Subjective: 1 Day Post-Op Procedure(s) (LRB): TOTAL HIP ARTHROPLASTY ANTERIOR APPROACH (Right) Patient reports pain as moderate.   Patient complains of right knee pain.  Patient requesting to continue with Foley until tomorrow as it is severely uncomfortable to try to get up to urinate. Denies any CP, SOB, ABD pain. We will continue therapy today.  Patient requesting to go home with home health PT  Objective: Vital signs in last 24 hours: Temp:  [97.2 F (36.2 C)-99.4 F (37.4 C)] 99.4 F (37.4 C) (03/17 0041) Pulse Rate:  [65-99] 93 (03/17 0041) Resp:  [14-26] 18 (03/17 0041) BP: (102-137)/(58-114) 114/70 (03/17 0041) SpO2:  [95 %-100 %] 96 % (03/17 0041) Weight:  [172.4 kg] 172.4 kg (03/16 0926)  Intake/Output from previous day: 03/16 0701 - 03/17 0700 In: 1000 [I.V.:750; Blood:250] Out: 3470 [Urine:2730; Drains:240; Blood:500] Intake/Output this shift: No intake/output data recorded.  Recent Labs    11/27/19 1634 11/28/19 0433  HGB 11.8* 11.2*   Recent Labs    11/27/19 1634 11/28/19 0433  WBC 14.7* 13.1*  RBC 4.38 4.21  HCT 37.1 36.0  PLT 282 261   Recent Labs    11/27/19 1634 11/28/19 0433  NA  --  138  K  --  4.4  CL  --  103  CO2  --  26  BUN  --  15  CREATININE 0.91 0.93  GLUCOSE  --  168*  CALCIUM  --  7.9*   No results for input(s): LABPT, INR in the last 72 hours.  EXAM General - Patient is Alert, Appropriate and Oriented Extremity - Neurovascular intact Sensation intact distally Intact pulses distally Dressing - dressing C/D/I and no drainage, Praveena intact without drainage.  Hemovac intact. Motor Function - intact, moving foot and toes well on exam.   Past Medical History:  Diagnosis Date  . Anemia   . Anginal pain ([place])   . Anxiety   . Arthralgia of hip 07/29/2015  . Arthritis   . Arthritis, degenerative 07/29/2015  . Asthma   . Cephalalgia 07/25/2014  . Dependence on unknown drug ([place])    multiplt controlled drug  dependence  . Depression   . Diabetes mellitus without complication ([place])   . Dysrhythmia   . Eczema   . Fibromyalgia   . Gastritis   . GERD (gastroesophageal reflux disease)   . Gonalgia 07/29/2015   Overview:  Overview:  The patient has had bilateral intra-articular Hyalgan injections done on 07/16/2014 and although she seems to do well with this type of therapy, apparently her insurance company does not want to pay for they Hyalgan. On 11/27/2014 the patient underwent a bilateral genicular nerve block with excellent results. On 01/28/2015 she had a right knee genicular radiofrequency ablatio  . Gout   . H/O cardiovascular disorder 03/10/2015  . H/O surgical procedure 12/05/2012   Overview:  LSG ([facility] - April 2013)    . H/O thyroid disease 03/10/2015  . Headache   . Herpes   . History of artificial joint 07/29/2015  . History of hiatal hernia   . Hypertension   . Hypomagnesemia   . Hypothyroidism   . IDA (iron deficiency anemia) 05/28/2019  . LBP (low back pain) 07/29/2015  . Neuromuscular disorder ([place])   . Obesity   . PCOS (polycystic ovarian syndrome)   . Primary osteoarthritis of both knees 07/29/2015  . Sleep apnea    not using a Cpap machine at this time  . Thyroid nodule bilateral  . Umbilical  hernia     Assessment/Plan:   1 Day Post-Op Procedure(s) (LRB): TOTAL HIP ARTHROPLASTY ANTERIOR APPROACH (Right) Active Problems:   Status post total hip replacement, right  Estimated body mass index is 65.24 kg/m as calculated from the following:   Height as of this encounter: 5\' 4"  (1.626 m).   Weight as of this encounter: 172.4 kg. Advance diet Up with therapy, 50% weightbearing right lower extremity Needs bowel movement Labs stable Vital signs stable Care management to assist with discharge.  Patient desires to go home.   DVT Prophylaxis - Lovenox, Foot Pumps and TED hose 50% weightbearing right lower extremity   T. Rachelle Hora, PA-C [place] 11/28/2019, 7:52 AM

## 2019-11-28 NOTE — Progress Notes (Signed)
D: Pt alert and oriented x 4. Pt experiences persistent pain with little relief from PRN/scheduled medications. PT and OT worked with pt today.  A: Scheduled medications administered to pt, per MD orders. Support and encouragement provided. Frequent verbal contact made.    R: No adverse drug reactions noted. Pt complaint with medications and treatment plan. Pt interacts well with staff on the unit. Pt is stable at this time, Will continue to monitor and provide care for as ordered.

## 2019-11-29 ENCOUNTER — Inpatient Hospital Stay: Payer: Medicare HMO

## 2019-11-29 LAB — CBC
HCT: 32.1 % — ABNORMAL LOW (ref 36.0–46.0)
Hemoglobin: 10.3 g/dL — ABNORMAL LOW (ref 12.0–15.0)
MCH: 26.8 pg (ref 26.0–34.0)
MCHC: 32.1 g/dL (ref 30.0–36.0)
MCV: 83.6 fL (ref 80.0–100.0)
Platelets: 224 10*3/uL (ref 150–400)
RBC: 3.84 MIL/uL — ABNORMAL LOW (ref 3.87–5.11)
RDW: 15.1 % (ref 11.5–15.5)
WBC: 13.7 10*3/uL — ABNORMAL HIGH (ref 4.0–10.5)
nRBC: 0 % (ref 0.0–0.2)

## 2019-11-29 LAB — SARS CORONAVIRUS 2 (TAT 6-24 HRS): SARS Coronavirus 2: NEGATIVE

## 2019-11-29 LAB — SURGICAL PATHOLOGY

## 2019-11-29 LAB — GLUCOSE, CAPILLARY
Glucose-Capillary: 159 mg/dL — ABNORMAL HIGH (ref 70–99)
Glucose-Capillary: 187 mg/dL — ABNORMAL HIGH (ref 70–99)
Glucose-Capillary: 186 mg/dL — ABNORMAL HIGH (ref 70–99)
Glucose-Capillary: 130 mg/dL — ABNORMAL HIGH (ref 70–99)

## 2019-11-29 MED ORDER — MAGNESIUM HYDROXIDE 400 MG/5ML PO SUSP
30.0000 mL | Freq: Once | ORAL | Status: AC
  Administered 2019-11-29: 30 mL via ORAL
  Filled 2019-11-29: qty 30

## 2019-11-29 MED ORDER — VITAMIN D (ERGOCALCIFEROL) 1.25 MG (50000 UNIT) PO CAPS
50000.0000 [IU] | ORAL_CAPSULE | ORAL | Status: DC
  Administered 2019-11-30 – 2019-12-03 (×2): 50000 [IU] via ORAL
  Filled 2019-11-29 (×2): qty 1

## 2019-11-29 MED ORDER — METOPROLOL TARTRATE 50 MG PO TABS
50.0000 mg | ORAL_TABLET | Freq: Once | ORAL | Status: AC
  Administered 2019-11-29: 50 mg via ORAL
  Filled 2019-11-29: qty 1

## 2019-11-29 NOTE — Progress Notes (Signed)
Pt crying in pain after returning to room from radiology. C/O 10 out of 10 pain. 1 mg IV dilaudid administered.

## 2019-11-29 NOTE — Progress Notes (Addendum)
Subjective: 2 Days Post-Op Procedure(s) (LRB): TOTAL HIP ARTHROPLASTY ANTERIOR APPROACH (Right) Patient reports pain as severe pain located in the right groin.  Having a hard time moving the right hip.  Therapy notes extreme discomfort and pain with PT. No dizziness or lightheadedness.  No nausea or vomiting.  Does complain of a spinal headache. Denies any CP, SOB, ABD pain. We will continue with therapy today.  Patient requesting to go home with home health PT  Objective: Vital signs in last 24 hours: Temp:  [98.5 F (36.9 C)-100 F (37.8 C)] 100 F (37.8 C) (03/18 0734) Pulse Rate:  [97-124] 104 (03/18 0749) Resp:  [18-20] 18 (03/18 0749) BP: (88-126)/(46-79) 110/70 (03/18 0749) SpO2:  [93 %-99 %] 93 % (03/18 0734)  Intake/Output from previous day: 03/17 0701 - 03/18 0700 In: 2768.3 [I.V.:2768.3] Out: 1200 [Urine:1200] Intake/Output this shift: Total I/O In: -  Out: 600 [Urine:600]  Recent Labs    11/27/19 1634 11/28/19 0433 11/29/19 0240  HGB 11.8* 11.2* 10.3*   Recent Labs    11/28/19 0433 11/29/19 0240  WBC 13.1* 13.7*  RBC 4.21 3.84*  HCT 36.0 32.1*  PLT 261 224   Recent Labs    11/27/19 1634 11/28/19 0433  NA  --  138  K  --  4.4  CL  --  103  CO2  --  26  BUN  --  15  CREATININE 0.91 0.93  GLUCOSE  --  168*  CALCIUM  --  7.9*   No results for input(s): LABPT, INR in the last 72 hours.  EXAM General - Patient is Alert, Appropriate and Oriented Extremity - Neurovascular intact Sensation intact distally Intact pulses distally Dressing - dressing C/D/I and no drainage, Praveena intact without drainage.  Motor Function - intact, moving foot and toes well on exam.   Past Medical History:  Diagnosis Date  . Anemia   . Anginal pain ([place])   . Anxiety   . Arthralgia of hip 07/29/2015  . Arthritis   . Arthritis, degenerative 07/29/2015  . Asthma   . Cephalalgia 07/25/2014  . Dependence on unknown drug ([place])    multiplt controlled drug  dependence  . Depression   . Diabetes mellitus without complication ([place])   . Dysrhythmia   . Eczema   . Fibromyalgia   . Gastritis   . GERD (gastroesophageal reflux disease)   . Gonalgia 07/29/2015   Overview:  Overview:  The patient has had bilateral intra-articular Hyalgan injections done on 07/16/2014 and although she seems to do well with this type of therapy, apparently her insurance company does not want to pay for they Hyalgan. On 11/27/2014 the patient underwent a bilateral genicular nerve block with excellent results. On 01/28/2015 she had a right knee genicular radiofrequency ablatio  . Gout   . H/O cardiovascular disorder 03/10/2015  . H/O surgical procedure 12/05/2012   Overview:  LSG ([facility] - April 2013)    . H/O thyroid disease 03/10/2015  . Headache   . Herpes   . History of artificial joint 07/29/2015  . History of hiatal hernia   . Hypertension   . Hypomagnesemia   . Hypothyroidism   . IDA (iron deficiency anemia) 05/28/2019  . LBP (low back pain) 07/29/2015  . Neuromuscular disorder ([place])   . Obesity   . PCOS (polycystic ovarian syndrome)   . Primary osteoarthritis of both knees 07/29/2015  . Sleep apnea    not using a Cpap machine at this time  .  Thyroid nodule bilateral  . Umbilical hernia     Assessment/Plan:   2 Days Post-Op Procedure(s) (LRB): TOTAL HIP ARTHROPLASTY ANTERIOR APPROACH (Right) Active Problems:   Status post total hip replacement, right  Estimated body mass index is 65.24 kg/m as calculated from the following:   Height as of this encounter: 5\' 4"  (1.626 m).   Weight as of this encounter: 172.4 kg. Advance diet Up with therapy, 50% weightbearing right lower extremity Needs bowel movement Labs stable Vital signs stable Severe pain, will try to maximize as needed medications.  Will order right hip x-ray . Encourage incentive spirometer Care management to assist with discharge.  Patient desires to go home but currently does not appears  if she can go home safely and will need to go to skilled nursing facility   DVT Prophylaxis - Lovenox, Foot Pumps and TED hose 50% weightbearing right lower extremity   T. Rachelle Hora, PA-C [place] 11/29/2019, 10:09 AM

## 2019-11-29 NOTE — NC FL2 (Signed)
[place] LEVEL OF CARE SCREENING TOOL     IDENTIFICATION  Patient Name: Karen Lewis Birthdate: Dec 13, 1970 Sex: female Admission Date (Current Location): 11/27/2019  [place] and [place] Number:  Engineering geologist and Address:  [facility], [address], [place], [place] [address]      Provider Number: [id number]  Attending Physician Name and Address:  Hessie Knows, MD  Relative Name and Phone Number:  Sandrea Matte [phone]    Current Level of Care: Hospital Recommended Level of Care: [place] Prior Approval Number:    Date Approved/Denied:   PASRR Number: [id number]  Discharge Plan: SNF    Current Diagnoses: Patient Active Problem List   Diagnosis Date Noted  . Status post total hip replacement, right 11/27/2019  . Lymphedema 08/20/2019  . Type II diabetes mellitus with complication ([place]) 99991111  . Atypical chest pain 08/13/2019  . Heart palpitations 08/13/2019  . Leg swelling 07/15/2019  . Elevated LFTs 06/07/2019  . Edema of both legs 06/04/2019  . Hypocalcemia 06/04/2019  . Adult failure to thrive syndrome 06/03/2019  . IDA (iron deficiency anemia) 05/28/2019  . Mycobacterium abscessus infection 02/22/2019  . Surgical wound, non healing 09/27/2018  . Wound infection after surgery 08/11/2018  . Panniculitis 07/05/2018  . Allergy to iodine 04/26/2018  . H/O allergy to shellfish 04/26/2018  . Encounter for dental examination 02/21/2018  . Spondylosis without myelopathy or radiculopathy, lumbosacral region 02/21/2018  . History of gastric bypass 02/15/2018  . Depression 01/19/2018  . Secondary Osteoarthritis of knee (Bilateral) (R>L) 01/02/2018  . Encounter for dental exam and cleaning w/o abnormal findings 12/19/2017  . Post op infection 12/05/2017  . Osteoarthritis of hip (Right) 07/07/2017  . Osteoarthritis of shoulder (Left) 05/31/2017  . Upper extremity pain 04/21/2017  .  Chronic shoulder pain (Left) 04/21/2017  . Chronic pain syndrome 12/02/2016  . Neurogenic pain 12/02/2016  . Chest pain with low risk of acute coronary syndrome 08/04/2016  . SOB (shortness of breath) on exertion 08/04/2016  . Dysphagia, unspecified 05/04/2016  . Gastroesophageal reflux disease without esophagitis 05/04/2016  . Vitamin D insufficiency 11/27/2015  . Nontoxic goiter 11/12/2015  . Other psychoactive substance dependence, uncomplicated ([place]) [id number]  . Controlled drug dependence ([place]) 10/13/2015  . Lumbar spondylosis 09/18/2015  . Hypomagnesemia 08/27/2015  . Morbid obesity with body mass index (BMI) greater than or equal to 70 in adult [facility]) 08/27/2015  . Long term current use of opiate analgesic 07/29/2015  . Long term prescription opiate use 07/29/2015  . Opiate use 07/29/2015  . Encounter for therapeutic drug level monitoring 07/29/2015  . Opiate dependence ([place]) 07/29/2015  . Chronic knee pain (Primary Area of Pain) (Bilateral) (R>L) 07/29/2015  . Chronic low back pain (Third area of Pain) (Bilateral) (R>L) 07/29/2015  . Lumbar facet syndrome (Bilateral) (R>L) 07/29/2015  . Osteoarthritis 07/29/2015  . Grade 1 (1.4 cm) Anterolisthesis of L4 over L5 07/29/2015  . Chronic hip pain (Secondary area of Pain) (Right) 07/29/2015  . S/P THR (total hip replacement) (Left) 07/29/2015  . History of methicillin resistant staphylococcus aureus (MRSA) 07/29/2015  . History of bariatric surgery 07/29/2015  . History of methicillin resistant Staphylococcus aureus infection 07/29/2015  . Eczema 05/21/2015  . Gout 05/21/2015  . Headache, migraine 05/21/2015  . Bilateral polycystic ovarian syndrome 05/21/2015  . Disease of thyroid gland 05/21/2015  . Major depressive disorder, single episode 05/21/2015  . Dermatitis 05/21/2015  . Obstructive apnea 04/30/2015  . History of asthma  03/10/2015  . Binge eating disorder 03/10/2015  . Fibromyalgia 03/10/2015  . Anxiety, generalized  03/10/2015  . Insomnia, persistent 03/10/2015  . Depression, major, recurrent, moderate ([place]) 03/10/2015  . Avitaminosis D 04/30/2014  . Hyperlipidemia 04/25/2014  . Hyperlipidemia due to type 2 diabetes mellitus ([place]) 04/25/2014  . Uncomplicated asthma [id number]  . Asthma 03/19/2014  . Goiter, nontoxic, multinodular 10/09/2013  . Apnea, sleep 12/05/2012  . History of surgical procedure 12/05/2012  . Type 2 diabetes mellitus without complication, without long-term current use of insulin ([place]) 12/05/2012  . Hypertension associated with diabetes ([place]) 12/05/2012    Orientation RESPIRATION BLADDER Height & Weight     Self, Time, Situation, Place  Normal Continent Weight: (!) 380 lb 1.2 oz (172.4 kg) Height:  5\' 4"  (162.6 cm)  BEHAVIORAL SYMPTOMS/MOOD NEUROLOGICAL BOWEL NUTRITION STATUS      Continent Diet(Carb modified)  AMBULATORY STATUS COMMUNICATION OF NEEDS Skin   Extensive Assist Verbally Surgical wounds                       Personal Care Assistance Level of Assistance  Bathing, Dressing Bathing Assistance: Limited assistance   Dressing Assistance: Limited assistance     Functional Limitations Info             SPECIAL CARE FACTORS FREQUENCY  PT (By licensed PT), OT (By licensed OT)     PT Frequency: 5x week OT Frequency: 5x week            Contractures Contractures Info: Not present    Additional Factors Info  Code Status, Allergies, Isolation Precautions Code Status Info: Full Code Allergies Info: Bactrium, Omalizamab, Ciprofloxacin, Shellfish allergy, Suffamethoxazole, Trimrthoprim, Clindamycin,Nsaids, Sulfaantibiotics     Isolation Precautions Info: MRSA in the past     Current Medications (11/29/2019):  This is the current hospital active medication list Current Facility-Administered Medications  Medication Dose Route Frequency Provider Last Rate Last Admin  . 0.9 %  sodium chloride infusion   Intravenous Continuous Hessie Knows, MD 20  mL/hr at 11/29/19 0600 Rate Verify at 11/29/19 0600  . acetaminophen (TYLENOL) tablet 325-650 mg  325-650 mg Oral Q6H PRN Hessie Knows, MD   650 mg at 11/29/19 0805  . acidophilus (RISAQUAD) capsule 1 capsule  1 capsule Oral Daily Hessie Knows, MD   1 capsule at 11/29/19 1026  . alum & mag hydroxide-simeth (MAALOX/MYLANTA) 200-200-20 MG/5ML suspension 30 mL  30 mL Oral Q4H PRN Hessie Knows, MD      . amitriptyline (ELAVIL) tablet 100 mg  100 mg Oral QHS Hessie Knows, MD   100 mg at 11/28/19 2104  . ascorbic acid (VITAMIN C) tablet 1,000 mg  1,000 mg Oral Daily Hessie Knows, MD   1,000 mg at 11/29/19 0805  . bisacodyl (DULCOLAX) suppository 10 mg  10 mg Rectal Daily PRN Hessie Knows, MD      . butalbital-acetaminophen-caffeine (FIORICET) [phone] MG per tablet 1 tablet  1 tablet Oral Q6H PRN Hessie Knows, MD      . calcium carbonate (TUMS - dosed in mg elemental calcium) chewable tablet 200 mg of elemental calcium  1 tablet Oral TID WC Hessie Knows, MD   200 mg of elemental calcium at 11/29/19 1153  . Chlorhexidine Gluconate Cloth 2 % PADS 6 each  6 each Topical Daily Hessie Knows, MD   6 each at 11/29/19 1026  . clonazePAM (KLONOPIN) tablet 0.5 mg  0.5 mg Oral TID PRN Hessie Knows, MD      .  diphenhydrAMINE (BENADRYL) capsule 25 mg  25 mg Oral Q8H PRN Hessie Knows, MD   25 mg at 11/29/19 0640  . docusate sodium (COLACE) capsule 100 mg  100 mg Oral BID Hessie Knows, MD   100 mg at 11/29/19 0805  . enoxaparin (LOVENOX) injection 40 mg  40 mg Subcutaneous Q12H Hessie Knows, MD   40 mg at 11/29/19 0805  . famotidine (PEPCID) tablet 20 mg  20 mg Oral Daily PRN Hessie Knows, MD      . gentamicin ointment (GARAMYCIN) 0.1 % 1 application  1 application Topical Daily Hessie Knows, MD   1 application at [id number] [phone]  . HYDROmorphone (DILAUDID) injection 0.5-1 mg  0.5-1 mg Intravenous Q4H PRN Hessie Knows, MD   1 mg at 11/29/19 1342  . insulin aspart (novoLOG) injection 0-20 Units  0-20 Units  Subcutaneous TID WC Hessie Knows, MD   4 Units at 11/29/19 1153  . ipratropium-albuterol (DUONEB) 0.5-2.5 (3) MG/3ML nebulizer solution 3 mL  3 mL Nebulization Q6H PRN Duanne Guess, PA-C      . levothyroxine (SYNTHROID) tablet 250 mcg  250 mcg Oral QAC breakfast Hessie Knows, MD   250 mcg at 11/29/19 0500  . lisinopril (ZESTRIL) tablet 5 mg  5 mg Oral QHS Hessie Knows, MD   Stopped at 11/29/19 2200  . magnesium citrate solution 1 Bottle  1 Bottle Oral Once PRN Hessie Knows, MD      . magnesium hydroxide (MILK OF MAGNESIA) suspension 30 mL  30 mL Oral Daily PRN Hessie Knows, MD      . magnesium oxide (MAG-OX) tablet 800 mg  800 mg Oral BID Hessie Knows, MD   800 mg at 11/29/19 0805  . meclizine (ANTIVERT) tablet 25 mg  25 mg Oral TID PRN Hessie Knows, MD      . menthol-cetylpyridinium (CEPACOL) lozenge 3 mg  1 lozenge Oral PRN Hessie Knows, MD       Or  . phenol (CHLORASEPTIC) mouth spray 1 spray  1 spray Mouth/Throat PRN Hessie Knows, MD      . metFORMIN (GLUCOPHAGE) tablet 1,000 mg  1,000 mg Oral BID WC Hessie Knows, MD   1,000 mg at 11/29/19 0805  . metoCLOPramide (REGLAN) tablet 5-10 mg  5-10 mg Oral Q8H PRN Hessie Knows, MD       Or  . metoCLOPramide (REGLAN) injection 5-10 mg  5-10 mg Intravenous Q8H PRN Hessie Knows, MD      . metoprolol tartrate (LOPRESSOR) tablet 75 mg  75 mg Oral BID Hessie Knows, MD   Stopped at 11/29/19 [phone]  . montelukast (SINGULAIR) tablet 10 mg  10 mg Oral QHS Hessie Knows, MD   10 mg at 11/28/19 2104  . multivitamin with minerals tablet 2 tablet  2 tablet Oral Daily Hessie Knows, MD   2 tablet at 11/29/19 0803  . naloxone [facility]) nasal spray 4 mg/0.1 mL  0.4 mg Nasal Once PRN Hessie Knows, MD      . naphazoline-pheniramine (NAPHCON-A) 0.025-0.3 % ophthalmic solution 1 drop  1 drop Both Eyes QID PRN Hessie Knows, MD      . ondansetron [facility]) tablet 4 mg  4 mg Oral Q6H PRN Hessie Knows, MD       Or  . ondansetron [facility]) injection 4 mg  4  mg Intravenous Q6H PRN Hessie Knows, MD      . oxyCODONE (Oxy IR/ROXICODONE) immediate release tablet 10-15 mg  10-15 mg Oral Q4H PRN Hessie Knows, MD   10  mg at 11/29/19 1025  . oxyCODONE (Oxy IR/ROXICODONE) immediate release tablet 5-10 mg  5-10 mg Oral Q4H PRN Hessie Knows, MD      . oxyCODONE (OXYCONTIN) 12 hr tablet 15 mg  15 mg Oral Q12H Hessie Knows, MD   Stopped at 11/29/19 1036  . pantoprazole (PROTONIX) EC tablet 80 mg  80 mg Oral Daily Hessie Knows, MD   80 mg at 11/29/19 0805  . pregabalin (LYRICA) capsule 225 mg  225 mg Oral BID Hessie Knows, MD   225 mg at 11/29/19 0804  . QUEtiapine (SEROQUEL) tablet 100 mg  100 mg Oral QHS Hessie Knows, MD   100 mg at 11/28/19 2103  . rosuvastatin (CRESTOR) tablet 40 mg  40 mg Oral Daily Hessie Knows, MD   40 mg at 11/29/19 0803  . sertraline (ZOLOFT) tablet 200 mg  200 mg Oral Daily Hessie Knows, MD   200 mg at 11/29/19 0803  . tiZANidine (ZANAFLEX) tablet 4 mg  4 mg Oral BID PRN Hessie Knows, MD   4 mg at 11/28/19 2157  . traMADol (ULTRAM) tablet 50 mg  50 mg Oral Q6H Hessie Knows, MD   50 mg at 11/29/19 1153  . triamcinolone (NASACORT) nasal inhaler 2 spray  2 spray Nasal BID PRN Hessie Knows, MD      . vitamin B-12 (CYANOCOBALAMIN) tablet 2,500 mcg  2,500 mcg Oral Daily Hessie Knows, MD   2,500 mcg at 11/29/19 0804  . [START ON 11/30/2019] Vitamin D (Ergocalciferol) (DRISDOL) capsule 50,000 Units  50,000 Units Oral Once per day on Mon Fri Menz, Legrand Como, MD      . zolpidem [facility]) tablet 5 mg  5 mg Oral QHS PRN Hessie Knows, MD      . zonisamide (ZONEGRAN) capsule 150 mg  150 mg Oral QHS Hessie Knows, MD   150 mg at 11/28/19 2103     Discharge Medications: Please see discharge summary for a list of discharge medications.  Relevant Imaging Results:  Relevant Lab Results:   Additional Information SSN: [id number]  Taquisha Phung, Gardiner Rhyme, LCSW

## 2019-11-29 NOTE — Progress Notes (Signed)
Physical Therapy Treatment Patient Details Name: Karen Lewis MRN: [id number] DOB: 26-Nov-1970 Today's Date: 11/29/2019    History of Present Illness Per MD notes: Pt is a 49 y/o F with R hip OA s/p elective R THA.  PMH includes morbid obesity, HTN, DM, Depression, anxiety, asthma, fibromyalgia, L THA, R shoulder arthroplasty, bilat carpal tunnel release, GERD, cardiovascular disease, PCOS, and bilat knee OA.    PT Comments    Pt pleasant and willing to participate with PT services but only with bed level therex this session secondary to pain.  Pt declined attempt to sit up at EOB/stand secondary to significant RLE/hip pain with movement.  Pt actively participated with bed level therex but most RLE exercises at the knee/hip involved very low amplitude and/or isometric movements.  Pt very limited by pain and has made little progress towards functional goals.  Pt will benefit from PT services in a SNF setting upon discharge to safely address deficits listed in patient problem list for decreased caregiver assistance and eventual return to PLOF.      Follow Up Recommendations  SNF     Equipment Recommendations  3in1 (PT);Other (comment)(Bariatric BSC)    Recommendations for Other Services       Precautions / Restrictions Precautions Precautions: Anterior Hip Restrictions Weight Bearing Restrictions: Yes RLE Weight Bearing: Partial weight bearing RLE Partial Weight Bearing Percentage or Pounds: 50%    Mobility  Bed Mobility               General bed mobility comments: NT per patient request  Transfers                 General transfer comment: NT per patient request  Ambulation/Gait                 Stairs             Wheelchair Mobility    Modified Rankin (Stroke Patients Only)       Balance                                            Cognition Arousal/Alertness: Awake/alert Behavior During Therapy: WFL for tasks  assessed/performed Overall Cognitive Status: Within Functional Limits for tasks assessed                                        Exercises Total Joint Exercises Ankle Circles/Pumps: AROM;Strengthening;Both;10 reps;15 reps(with manual resistance) Quad Sets: Strengthening;Both;15 reps;10 reps Gluteal Sets: Strengthening;Both;10 reps;15 reps Towel Squeeze: Strengthening;Both;10 reps(gentle isometric) Heel Slides: AROM;Both;10 reps(gentle low amplitude on the RLE) Hip ABduction/ADduction: AAROM;Both;10 reps(gentle low amplitude on the RLE) Straight Leg Raises: Strengthening;Both;10 reps;AAROM(gentle isometric on the RLE) Other Exercises Other Exercises: LLE supine leg press with manual resistance x 5    General Comments        Pertinent Vitals/Pain Pain Assessment: 0-10 Pain Score: 5  Pain Location: [name] Descriptors / Indicators: Sore;Sharp;Constant;Grimacing Pain Intervention(s): Limited activity within patient's tolerance;Premedicated before session;Monitored during session    Home Living                      Prior Function            PT Goals (current goals can now be found in the care  plan section) Progress towards PT goals: Not progressing toward goals - comment(Limited by pain)    Frequency    BID      PT Plan Current plan remains appropriate    Co-evaluation              AM-PAC PT "6 Clicks" Mobility   Outcome Measure  Help needed turning from your back to your side while in a flat bed without using bedrails?: A Lot Help needed moving from lying on your back to sitting on the side of a flat bed without using bedrails?: A Lot Help needed moving to and from a bed to a chair (including a wheelchair)?: Total Help needed standing up from a chair using your arms (e.g., wheelchair or bedside chair)?: A Lot Help needed to walk in hospital room?: Total Help needed climbing 3-5 steps with a railing? : Total 6 Click Score:  9    End of Session   Activity Tolerance: Patient limited by pain Patient left: in bed;with call bell/phone within reach;with bed alarm set;with family/visitor present Nurse Communication: Mobility status PT Visit Diagnosis: Other abnormalities of gait and mobility (R26.89);Muscle weakness (generalized) (M62.81);Pain Pain - Right/Left: Right Pain - part of body: Hip;Leg     Time: [id number] PT Time Calculation (min) (ACUTE ONLY): 17 min  Charges:  $Therapeutic Exercise: 8-22 mins                     D. Royetta Asal PT, DPT 11/29/19, 4:28 PM

## 2019-11-29 NOTE — Progress Notes (Signed)
Physical Therapy Treatment Patient Details Name: Karen Lewis MRN: [id number] DOB: 1971-06-24 Today's Date: 11/29/2019    History of Present Illness Per MD notes: Pt is a 49 y/o F with R hip OA s/p elective R THA.  PMH includes morbid obesity, HTN, DM, Depression, anxiety, asthma, fibromyalgia, L THA, R shoulder arthroplasty, bilat carpal tunnel release, GERD, cardiovascular disease, PCOS, and bilat knee OA.    PT Comments    Pt willing to participate with therapy this session but was very limited by pain throughout.  Pt required increased assistance compared to prior sessions with bed mobility tasks.  The patient cried out in pain loudly during sup to/from sit even with mod to max support of her RLE.  Pt was able to clear the surface of the bed during transfer attempts but was unable to come to full upright standing position and remained in standing only briefly before requiring to return to sitting secondary to R hip pain.  Pt will benefit from PT services in a SNF setting upon discharge to safely address deficits listed in patient problem list for decreased caregiver assistance and eventual return to PLOF.      Follow Up Recommendations  SNF     Equipment Recommendations  3in1 (PT);Other (comment)(Bariatric BSC)    Recommendations for Other Services       Precautions / Restrictions Precautions Precautions: Anterior Hip Restrictions Weight Bearing Restrictions: Yes RLE Weight Bearing: Partial weight bearing RLE Partial Weight Bearing Percentage or Pounds: 50%    Mobility  Bed Mobility Overal bed mobility: Needs Assistance Bed Mobility: Supine to Sit;Sit to Supine     Supine to sit: Mod assist;Max assist Sit to supine: Max assist;Mod assist   General bed mobility comments: Pt required mod to max A for BLE control in and out of bed  Transfers Overall transfer level: Needs assistance Equipment used: Rolling walker (2 wheeled) Transfers: Sit to/from Stand Sit to Stand:  From elevated surface;Min assist;+2 physical assistance;Mod assist         General transfer comment: Mod verbal cues for sequencing with pt able to clear the surface of the bed but unable to come to full upright standing position and returned to sitting quickly secondary to [name]  Ambulation/Gait             General Gait Details: Unable/unsafe to attempt   Stairs             Wheelchair Mobility    Modified Rankin (Stroke Patients Only)       Balance Overall balance assessment: Needs assistance Sitting-balance support: Feet unsupported;No upper extremity supported Sitting balance-Leahy Scale: Good Sitting balance - Comments: able to maintain upright sitting posture without UE support   Standing balance support: Bilateral upper extremity supported Standing balance-Leahy Scale: Fair Standing balance comment: Heavy lean on the RW in standing                            Cognition Arousal/Alertness: Awake/alert Behavior During Therapy: WFL for tasks assessed/performed Overall Cognitive Status: Within Functional Limits for tasks assessed                                        Exercises Total Joint Exercises Ankle Circles/Pumps: AROM;Strengthening;Both;10 reps;5 reps Quad Sets: Strengthening;Both;10 reps;5 reps Gluteal Sets: Strengthening;Both;10 reps;5 reps Hip ABduction/ADduction: AAROM;Both;10 reps;5 reps(gentle low amplitude on  the RLE) Long Arc Quad: AROM;Both;Strengthening;10 reps;5 reps Knee Flexion: AROM;Both;10 reps;Strengthening;5 reps  Static and dynamic sitting at EOB with anterior weight shifting to promote transfer performane    General Comments        Pertinent Vitals/Pain Pain Assessment: 0-10 Pain Score: 5  Pain Location: [name] Descriptors / Indicators: Grimacing;Guarding;Sore;Sharp;Crying;Moaning;Constant Pain Intervention(s): Premedicated before session;Monitored during session    Home Living                       Prior Function            PT Goals (current goals can now be found in the care plan section) Progress towards PT goals: Not progressing toward goals - comment(Pt limited by pain)    Frequency    BID      PT Plan Current plan remains appropriate    Co-evaluation              AM-PAC PT "6 Clicks" Mobility   Outcome Measure  Help needed turning from your back to your side while in a flat bed without using bedrails?: A Lot Help needed moving from lying on your back to sitting on the side of a flat bed without using bedrails?: A Lot Help needed moving to and from a bed to a chair (including a wheelchair)?: Total Help needed standing up from a chair using your arms (e.g., wheelchair or bedside chair)?: A Lot Help needed to walk in hospital room?: Total Help needed climbing 3-5 steps with a railing? : Total 6 Click Score: 9    End of Session Equipment Utilized During Treatment: Gait belt Activity Tolerance: Patient limited by pain Patient left: in bed;with call bell/phone within reach;with bed alarm set Nurse Communication: Mobility status;Other (comment)(Pt refused to float heels or don SCDs, nursing notified) PT Visit Diagnosis: Other abnormalities of gait and mobility (R26.89);Muscle weakness (generalized) (M62.81);Pain Pain - Right/Left: Right Pain - part of body: Hip;Leg     Time: [id number] PT Time Calculation (min) (ACUTE ONLY): 38 min  Charges:  $Therapeutic Exercise: 23-37 mins $Therapeutic Activity: 8-22 mins                     D. Scott Nature Kueker PT, DPT 11/29/19, 10:59 AM

## 2019-11-29 NOTE — TOC Progression Note (Signed)
Transition of Care [facility]) - Progression Note    Patient Details  Name: Karen Lewis MRN: [id number] Date of Birth: 09-08-1971  Transition of Care [facility]) CM/SW Contact  Gaia Gullikson, Gardiner Rhyme, LCSW Phone Number: 11/29/2019, 3:22 PM  Clinical Narrative:  Met with pt who reports she is having a hard time today. Discussed starting Fl2 in case plan for short term rehab. Pt insists wants to go home with assist from her boyfriend-[name]. Pt feels she is doing her best and just having much pain. Will check with tomorrow and see what the plan is for her.     Expected Discharge Plan: [place] Barriers to Discharge: Continued Medical Work up  Expected Discharge Plan and Services Expected Discharge Plan: [place] In-house Referral: Clinical Social Work     Living arrangements for the past 2 months: Single Family Home                                       Social Determinants of Health (SDOH) Interventions    Readmission Risk Interventions No flowsheet data found.

## 2019-11-29 NOTE — Progress Notes (Signed)
OT Cancellation Note  Patient Details Name: MANAMI LAMIA MRN: [id number] DOB: 12-Aug-1971   Cancelled Treatment:    Reason Eval/Treat Not Completed: Medical issues which prohibited therapy. Per chart review, pt to have f/u imaging 2/2 increased R Hip pain. Will hold OT tx at this time and initiate services as available and pt medically appropriate.   Shara Blazing, M.S., OTR/L Ascom: [phone] 11/29/19, 1:25 PM

## 2019-11-29 NOTE — Progress Notes (Signed)
Pt c/o of heart palpitations and feels like heart is racing with some chest discomfort. Does not endorse CP or SOA. Dr. Rudene Christians notified, dose of metoprolol ordered. Morning dose held due to hypotension.

## 2019-11-30 ENCOUNTER — Inpatient Hospital Stay: Payer: Medicare HMO

## 2019-11-30 LAB — GLUCOSE, CAPILLARY
Glucose-Capillary: 198 mg/dL — ABNORMAL HIGH (ref 70–99)
Glucose-Capillary: 151 mg/dL — ABNORMAL HIGH (ref 70–99)
Glucose-Capillary: 240 mg/dL — ABNORMAL HIGH (ref 70–99)

## 2019-11-30 MED ORDER — CLONAZEPAM 0.5 MG PO TABS: 0.5000 mg | ORAL_TABLET | Freq: Three times a day (TID) | ORAL | 0 refills | Status: DC | PRN

## 2019-11-30 MED ORDER — ENOXAPARIN SODIUM 40 MG/0.4ML ~~LOC~~ SOLN: 40.0000 mg | SUBCUTANEOUS | 0 refills | Status: DC

## 2019-11-30 MED ORDER — OXYCODONE HCL ER 15 MG PO T12A: 15.0000 mg | EXTENDED_RELEASE_TABLET | Freq: Two times a day (BID) | ORAL | 0 refills | Status: DC

## 2019-11-30 MED ORDER — TIZANIDINE HCL 4 MG PO TABS: 4.0000 mg | ORAL_TABLET | Freq: Two times a day (BID) | ORAL | 0 refills | Status: AC | PRN

## 2019-11-30 MED ORDER — OXYCODONE HCL 5 MG PO TABS: 5.0000 mg | ORAL_TABLET | ORAL | 0 refills | Status: DC | PRN

## 2019-11-30 NOTE — Progress Notes (Signed)
Physical Therapy Treatment Patient Details Name: Karen Lewis MRN: [id number] DOB: 1971/05/29 Today's Date: 11/30/2019    History of Present Illness Per MD notes: Pt is a 49 y/o F with R hip OA s/p elective R THA.  PMH includes morbid obesity, HTN, DM, Depression, anxiety, asthma, fibromyalgia, L THA, R shoulder arthroplasty, bilat carpal tunnel release, GERD, cardiovascular disease, PCOS, and bilat knee OA.    PT Comments    Pt willing to participate with the session but remained very limited by pain.  Pt required extensive assist with rolling and scooting activities in the bed but did participate with the tasks.  Patient actively participated with all therex but RLE hip and knee movements remain very limited by pain.  Session limited by pt preparation for transport to imaging.  Pt will benefit from PT services in a SNF setting upon discharge to safely address deficits listed in patient problem list for decreased caregiver assistance and eventual return to PLOF.    Follow Up Recommendations  SNF     Equipment Recommendations  3in1 (PT);Other (comment)(bariatric BSC)    Recommendations for Other Services       Precautions / Restrictions Precautions Precautions: Anterior Hip Restrictions Weight Bearing Restrictions: Yes RLE Weight Bearing: Partial weight bearing    Mobility  Bed Mobility Overal bed mobility: Needs Assistance Bed Mobility: Rolling Rolling: Mod assist;Max assist;+2 for physical assistance         General bed mobility comments: Pt participated with extensive rolling L/R with +2 mod to max A and cues for sequencing  Transfers                 General transfer comment: NT secondary to pt prepping for transport to imaging  Ambulation/Gait                 Stairs             Wheelchair Mobility    Modified Rankin (Stroke Patients Only)       Balance                                            Cognition  Arousal/Alertness: Awake/alert Behavior During Therapy: WFL for tasks assessed/performed Overall Cognitive Status: Within Functional Limits for tasks assessed                                        Exercises Total Joint Exercises Ankle Circles/Pumps: AROM;Strengthening;Both;10 reps Quad Sets: Strengthening;Both;10 reps Gluteal Sets: Strengthening;Both;10 reps Hip ABduction/ADduction: AAROM;Both;10 reps(gentle low amplitude on RLE) Straight Leg Raises: Strengthening;Both;10 reps;AAROM(gentle low amplitude on RLE) Bridges: Left;Strengthening;5 reps(mostly isometric with occasional min amplitude) Other Exercises Other Exercises: Bed mobility training and core therex with multiple rolling L/R to pt tolerance and scooting to [facility]    General Comments        Pertinent Vitals/Pain Pain Assessment: 0-10 Pain Score: 5  Pain Location: [name] Descriptors / Indicators: Sore;Sharp;Constant;Grimacing Pain Intervention(s): Premedicated before session;Monitored during session    Home Living                      Prior Function            PT Goals (current goals can now be found in the care plan section) Progress towards  PT goals: Not progressing toward goals - comment(Limited by pain)    Frequency    BID      PT Plan Current plan remains appropriate    Co-evaluation              AM-PAC PT "6 Clicks" Mobility   Outcome Measure  Help needed turning from your back to your side while in a flat bed without using bedrails?: A Lot Help needed moving from lying on your back to sitting on the side of a flat bed without using bedrails?: A Lot Help needed moving to and from a bed to a chair (including a wheelchair)?: Total Help needed standing up from a chair using your arms (e.g., wheelchair or bedside chair)?: A Lot Help needed to walk in hospital room?: Total Help needed climbing 3-5 steps with a railing? : Total 6 Click Score: 9    End of  Session   Activity Tolerance: Patient limited by pain Patient left: in bed;with call bell/phone within reach;with bed alarm set;with nursing/sitter in room Nurse Communication: Mobility status PT Visit Diagnosis: Other abnormalities of gait and mobility (R26.89);Muscle weakness (generalized) (M62.81);Pain Pain - Right/Left: Right Pain - part of body: Hip;Leg     Time: [id number] PT Time Calculation (min) (ACUTE ONLY): 17 min  Charges:  $Therapeutic Exercise: 8-22 mins                     D. Scott Emelyn Roen PT, DPT 11/30/19, 12:19 PM

## 2019-11-30 NOTE — Progress Notes (Signed)
Physical Therapy Treatment Patient Details Name: Karen Lewis MRN: [id number] DOB: 1970/11/06 Today's Date: 11/30/2019    History of Present Illness Per MD notes: Pt is a 49 y/o F with R hip OA s/p elective R THA.  PMH includes morbid obesity, HTN, DM, Depression, anxiety, asthma, fibromyalgia, L THA, R shoulder arthroplasty, bilat carpal tunnel release, GERD, cardiovascular disease, PCOS, and bilat knee OA.    PT Comments    Pt pleasant and motivated to participate during the session.  Pt made very good progress this session and was able to perform functional mobility tasks with significantly decreased [name]. Pt was able to get in and out of bed needing assistance only to manage the RLE and was very motivated and determined to do as much of the work as she could on her own.  Pt was able to stand from the EOB with +2 CGA only requiring no physical assistance.  Pt stood for approximately 2 minutes occasionally leaning over and resting forearms on the RW and occasionally standing upright.  Pt was unable, however, to advance the RLE while in standing.  Pt will benefit from PT services in a SNF setting upon discharge to safely address deficits listed in patient problem list for decreased caregiver assistance and eventual return to PLOF.     Follow Up Recommendations  SNF     Equipment Recommendations  3in1 (PT);Other (comment)(Bariatric BSC)    Recommendations for Other Services       Precautions / Restrictions Precautions Precautions: Anterior Hip Restrictions Weight Bearing Restrictions: Yes RLE Weight Bearing: Partial weight bearing    Mobility  Bed Mobility Overal bed mobility: Needs Assistance Bed Mobility: Rolling Rolling: Mod assist;Max assist;+2 for physical assistance   Supine to sit: Mod assist Sit to supine: Mod assist   General bed mobility comments: Mod A for RLE control in and out of bed with significantly decreased [name] with movement  Transfers Overall  transfer level: Needs assistance Equipment used: Rolling walker (2 wheeled) Transfers: Sit to/from Stand Sit to Stand: +2 safety/equipment;Min guard;From elevated surface         General transfer comment: Pt able to stand x 2 with CGA from an elevated surface with min verbal cues for sequencing with max standing tolerance grossly 2 minutes  Ambulation/Gait             General Gait Details: Attempt at advancing RLE with pt unable   Stairs             Wheelchair Mobility    Modified Rankin (Stroke Patients Only)       Balance Overall balance assessment: Needs assistance Sitting-balance support: No upper extremity supported Sitting balance-Leahy Scale: Normal Sitting balance - Comments: able to maintain upright sitting posture without UE support   Standing balance support: Bilateral upper extremity supported Standing balance-Leahy Scale: Fair Standing balance comment: Heavy lean on the RW in standing                            Cognition Arousal/Alertness: Awake/alert Behavior During Therapy: WFL for tasks assessed/performed Overall Cognitive Status: Within Functional Limits for tasks assessed                                        Exercises Total Joint Exercises Ankle Circles/Pumps: AROM;Strengthening;Both;10 reps;5 reps Quad Sets: Strengthening;Both;10 reps;5 reps Gluteal Sets:  Strengthening;Both;10 reps Heel Slides: Both;Right;5 reps;AAROM(small amplitude) Hip ABduction/ADduction: AAROM;Both;10 reps Straight Leg Raises: Strengthening;Both;10 reps;AAROM(gentle low amplitude on RLE) Long Arc Quad: AROM;Both;Strengthening;10 reps Knee Flexion: AROM;Both;10 reps;Strengthening Bridges: Left;Strengthening;5 reps(mostly isometric with occasional min amplitude) Other Exercises Other Exercises: Bed mobility training and core therex with multiple rolling L/R to pt tolerance and scooting to [facility]    General Comments        Pertinent  Vitals/Pain Pain Assessment: 0-10 Pain Score: 7  Pain Location: [name] Descriptors / Indicators: Sore;Sharp;Constant;Grimacing Pain Intervention(s): Premedicated before session;Monitored during session    Home Living                      Prior Function            PT Goals (current goals can now be found in the care plan section) Progress towards PT goals: Progressing toward goals    Frequency    BID      PT Plan Current plan remains appropriate    Co-evaluation              AM-PAC PT "6 Clicks" Mobility   Outcome Measure  Help needed turning from your back to your side while in a flat bed without using bedrails?: A Lot Help needed moving from lying on your back to sitting on the side of a flat bed without using bedrails?: A Lot Help needed moving to and from a bed to a chair (including a wheelchair)?: Total Help needed standing up from a chair using your arms (e.g., wheelchair or bedside chair)?: A Lot Help needed to walk in hospital room?: Total Help needed climbing 3-5 steps with a railing? : Total 6 Click Score: 9    End of Session Equipment Utilized During Treatment: Gait belt Activity Tolerance: Patient tolerated treatment well Patient left: in bed;Other (comment)(Pt left with OT who entered room to begin session) Nurse Communication: Mobility status PT Visit Diagnosis: Other abnormalities of gait and mobility (R26.89);Muscle weakness (generalized) (M62.81);Pain Pain - Right/Left: Right Pain - part of body: Hip;Leg     Time: [id number] PT Time Calculation (min) (ACUTE ONLY): 30 min  Charges:  $Therapeutic Exercise: 8-22 mins $Therapeutic Activity: 8-22 mins                     D. Scott Loneta Tamplin PT, DPT 11/30/19, 2:35 PM

## 2019-11-30 NOTE — Progress Notes (Signed)
Patient stated right hip pain 7/10 at 2104, scheduled oxycodone 15mg  given. Recheck pain control, patient stated 8/10. PRN oxycodone 10mg  given at 2258. VS WNL, will continue to monitor. Patient states she takes oxycodone chronically for fibromyalgia.

## 2019-11-30 NOTE — Care Management Important Message (Signed)
Important Message  Patient Details  Name: Karen Lewis MRN: [id number] Date of Birth: 1970-10-03   Medicare Important Message Given:  Yes     Juliann Pulse A Rissa Turley 11/30/2019, 2:13 PM

## 2019-11-30 NOTE — Progress Notes (Signed)
Subjective: 3 Days Post-Op Procedure(s) (LRB): TOTAL HIP ARTHROPLASTY ANTERIOR APPROACH (Right) Patient reports pain as mild to moderate No dizziness or lightheadedness.  No nausea or vomiting.  Does complain of a spinal headache. Denies any CP, SOB, ABD pain. We will continue with therapy today.  Patient requesting to go home with home health PT  Objective: Vital signs in last 24 hours: Temp:  [98.1 F (36.7 C)-100.1 F (37.8 C)] 99.6 F (37.6 C) (03/19 0447) Pulse Rate:  [95-114] 107 (03/19 0447) Resp:  [18-20] 20 (03/19 0447) BP: (110-135)/(65-76) 135/68 (03/19 0447) SpO2:  [94 %-100 %] 97 % (03/19 0447)  Intake/Output from previous day: 03/18 0701 - 03/19 0700 In: 720 [P.O.:720] Out: 2550 [Urine:2550] Intake/Output this shift: No intake/output data recorded.  Recent Labs    11/27/19 1634 11/28/19 0433 11/29/19 0240  HGB 11.8* 11.2* 10.3*   Recent Labs    11/28/19 0433 11/29/19 0240  WBC 13.1* 13.7*  RBC 4.21 3.84*  HCT 36.0 32.1*  PLT 261 224   Recent Labs    11/27/19 1634 11/28/19 0433  NA  --  138  K  --  4.4  CL  --  103  CO2  --  26  BUN  --  15  CREATININE 0.91 0.93  GLUCOSE  --  168*  CALCIUM  --  7.9*   No results for input(s): LABPT, INR in the last 72 hours.  EXAM General - Patient is Alert, Appropriate and Oriented Extremity - Neurovascular intact Sensation intact distally Intact pulses distally Dressing - dressing C/D/I and no drainage, Praveena intact without drainage.  Motor Function - intact, moving foot and toes well on exam.   Past Medical History:  Diagnosis Date  . Anemia   . Anginal pain ([place])   . Anxiety   . Arthralgia of hip 07/29/2015  . Arthritis   . Arthritis, degenerative 07/29/2015  . Asthma   . Cephalalgia 07/25/2014  . Dependence on unknown drug ([place])    multiplt controlled drug dependence  . Depression   . Diabetes mellitus without complication ([place])   . Dysrhythmia   . Eczema   . Fibromyalgia   .  Gastritis   . GERD (gastroesophageal reflux disease)   . Gonalgia 07/29/2015   Overview:  Overview:  The patient has had bilateral intra-articular Hyalgan injections done on 07/16/2014 and although she seems to do well with this type of therapy, apparently her insurance company does not want to pay for they Hyalgan. On 11/27/2014 the patient underwent a bilateral genicular nerve block with excellent results. On 01/28/2015 she had a right knee genicular radiofrequency ablatio  . Gout   . H/O cardiovascular disorder 03/10/2015  . H/O surgical procedure 12/05/2012   Overview:  LSG ([facility] - April 2013)    . H/O thyroid disease 03/10/2015  . Headache   . Herpes   . History of artificial joint 07/29/2015  . History of hiatal hernia   . Hypertension   . Hypomagnesemia   . Hypothyroidism   . IDA (iron deficiency anemia) 05/28/2019  . LBP (low back pain) 07/29/2015  . Neuromuscular disorder ([place])   . Obesity   . PCOS (polycystic ovarian syndrome)   . Primary osteoarthritis of both knees 07/29/2015  . Sleep apnea    not using a Cpap machine at this time  . Thyroid nodule bilateral  . Umbilical hernia     Assessment/Plan:   3 Days Post-Op Procedure(s) (LRB): TOTAL HIP ARTHROPLASTY ANTERIOR APPROACH (Right) Active  Problems:   Status post total hip replacement, right  Estimated body mass index is 65.24 kg/m as calculated from the following:   Height as of this encounter: 5\' 4"  (1.626 m).   Weight as of this encounter: 172.4 kg. Advance diet Up with therapy, 50% weightbearing right lower extremity Needs bowel movementstable Vital signs stable Encourage incentive spirometer Care management to assist with discharge to SNF today   DVT Prophylaxis - Lovenox, Foot Pumps and TED hose 50% weightbearing right lower extremity   T. Rachelle Hora, PA-C [place] 11/30/2019, 8:27 AM

## 2019-11-30 NOTE — TOC Progression Note (Signed)
Transition of Care [facility]) - Progression Note    Patient Details  Name: Karen Lewis MRN: [id number] Date of Birth: 12-21-1970  Transition of Care [facility]) CM/SW Contact  Fianna Snowball, Gardiner Rhyme, LCSW Phone Number: 11/30/2019, 10:29 AM  Clinical Narrative:   Have met with to to discuss discharge plan. She has accepted to begin bed search and will consider rehab bed. She reports she has a fever and is having a chest x-ray today to check her lungs. Will look for rehab bed.    Expected Discharge Plan: [place] Barriers to Discharge: Continued Medical Work up  Expected Discharge Plan and Services Expected Discharge Plan: [place] In-house Referral: Clinical Social Work     Living arrangements for the past 2 months: Single Family Home                                       Social Determinants of Health (SDOH) Interventions    Readmission Risk Interventions No flowsheet data found.

## 2019-11-30 NOTE — Progress Notes (Signed)
Occupational Therapy Treatment Patient Details Name: Karen Lewis MRN: [id number] DOB: [birth year] Today's Date: 11/30/2019    History of present illness Per MD notes: Pt is a 49 y/o F with R hip OA s/p elective R THA.  PMH includes morbid obesity, HTN, DM, Depression, anxiety, asthma, fibromyalgia, L THA, R shoulder arthroplasty, bilat carpal tunnel release, GERD, cardiovascular disease, PCOS, and bilat knee OA.   OT comments  Karen Lewis seen for OT treatment on this date. Upon arrival to room pt was finishing OOB mobility session with PT and pt requesting to wash up in bed. Pt able to provide teach back on x2 falls prevention strategies and x1 energy conservation strategies from previous sessions. Pt instructed in PLB, night time toileting strategies, importance of safety during transfers (car transfers and toilet transfer), AE for LB access, and further falls prevention and energy conservation strategies. Currently requiring SETUP only for bathing, grooming, self-feeding, and UBD long sitting in bed. Increased time to doff B socks long sitting in bed and SBA + overhead trapeze to adjust position in bed. Pt verbalized understanding of instruction provided. Pt making good progress toward goals. Pt continues to benefit from skilled OT services to maximize return to PLOF and minimize risk of future falls, injury, caregiver burden, and readmission. Will continue to follow POC. Discharge recommendation remains appropriate.    Follow Up Recommendations  SNF    Equipment Recommendations  3 in 1 bedside commode    Recommendations for Other Services      Precautions / Restrictions Precautions Precautions: Anterior Hip Restrictions Weight Bearing Restrictions: Yes RLE Weight Bearing: Partial weight bearing RLE Partial Weight Bearing Percentage or Pounds: 50%       Mobility Bed Mobility Overal bed mobility: Needs Assistance Bed Mobility: Rolling Rolling: Mod assist;Max assist;+2 for physical  assistance   Supine to sit: Mod assist Sit to supine: Mod assist   General bed mobility comments: MOD I to scoot higher in bed using overhead trapeze  Transfers Overall transfer level: Needs assistance Equipment used: Rolling walker (2 wheeled) Transfers: Sit to/from Stand Sit to Stand: +2 safety/equipment;Min guard;From elevated surface         General transfer comment: Pt able to stand x 2 with CGA from an elevated surface with min verbal cues for sequencing with max standing tolerance grossly 2 minutes    Balance Overall balance assessment: Needs assistance Sitting-balance support: No upper extremity supported Sitting balance-Leahy Scale: Normal Sitting balance - Comments: able to maintain upright sitting posture without UE support   Standing balance support: Bilateral upper extremity supported Standing balance-Leahy Scale: Fair Standing balance comment: Heavy lean on the RW in standing                           ADL either performed or assessed with clinical judgement   ADL Overall ADL's : Needs assistance/impaired                                       General ADL Comments: MOD I + Increased time doffs B socks long sitting in bed. SETUP bathing long sitting in bed - assist for back. SETUP grooming, self-feeding, and don night gown long sitting in bed.      Vision       Perception     Praxis      Cognition Arousal/Alertness: Awake/alert Behavior During  Therapy: WFL for tasks assessed/performed Overall Cognitive Status: Within Functional Limits for tasks assessed                                 General Comments: Pt pleasant, conversational, A&O x4 during session.        Exercises Total Joint Exercises Ankle Circles/Pumps: AROM;Strengthening;Both;10 reps;5 reps Quad Sets: Strengthening;Both;10 reps;5 reps Gluteal Sets: Strengthening;Both;10 reps Heel Slides: Both;Right;5 reps;AAROM(small amplitude) Hip  ABduction/ADduction: AAROM;Both;10 reps Straight Leg Raises: Strengthening;Both;10 reps;AAROM(gentle low amplitude on RLE) Long Arc Quad: AROM;Both;Strengthening;10 reps Knee Flexion: AROM;Both;10 reps;Strengthening Bridges: Left;Strengthening;5 reps(mostly isometric with occasional min amplitude) Other Exercises Other Exercises: Pt provided teachback re: energy conservation techniques, falls prevention strategies, home/routine modifications. Pt educated re: PLB, night time toileting strategies, importance of safety during transfers (car transfers and toilet transfer), AE for LB access Other Exercises: Bathing long sitting in bed, grooming (deodorant, lotion, baby powder) UBD, doff B socks, bed mobility, self-feeding, positioning for meal and comfort, room setup, therapeutic listening   Shoulder Instructions       General Comments      Pertinent Vitals/ Pain       Pain Assessment: Faces Pain Score: 7  Faces Pain Scale: Hurts little more Pain Location: [name] Descriptors / Indicators: Sore;Sharp;Constant;Grimacing Pain Intervention(s): Limited activity within patient's tolerance;Repositioned  Home Living                                          Prior Functioning/Environment              Frequency  Min 2X/week        Progress Toward Goals  OT Goals(current goals can now be found in the care plan section)  Progress towards OT goals: Progressing toward goals  Acute Rehab OT Goals Patient Stated Goal: To be able to walk to the bathroom OT Goal Formulation: With patient Time For Goal Achievement: 12/12/19 Potential to Achieve Goals: Good ADL Goals Pt Will Perform Grooming: sitting;with modified independence;standing Pt Will Perform Lower Body Dressing: sit to/from stand;with adaptive equipment;with modified independence Pt Will Transfer to Toilet: bedside commode;stand pivot transfer;with min assist(With LRAD PRN for improved safety and  functional independenc) Pt Will Perform Toileting - Clothing Manipulation and hygiene: sit to/from stand;with set-up;with supervision;with adaptive equipment( With LRAD PRN for improved safety and functional independen) Additional ADL Goal #1: Pt will independently verbalize a plan to implement at least 3 learned falls prevention strategies into her daily routines/home enviornment for improved safety and functional independence upon hospital DC.  Plan Discharge plan remains appropriate;Frequency remains appropriate    Co-evaluation                 AM-PAC OT "6 Clicks" Daily Activity     Outcome Measure   Help from another person eating meals?: None Help from another person taking care of personal grooming?: None Help from another person toileting, which includes using toliet, bedpan, or urinal?: A Lot Help from another person bathing (including washing, rinsing, drying)?: A Little Help from another person to put on and taking off regular upper body clothing?: A Little Help from another person to put on and taking off regular lower body clothing?: A Lot 6 Click Score: 18    End of Session    OT Visit Diagnosis: Other abnormalities of gait and mobility (  R26.89);Repeated falls (R29.6);Pain Pain - Right/Left: Right Pain - part of body: Hip;Knee;Leg   Activity Tolerance Patient tolerated treatment well   Patient Left in bed;with call bell/phone within reach;with bed alarm set   Nurse Communication          Time: 1410-1506 OT Time Calculation (min): 56 min  Charges: OT General Charges $OT Visit: 1 Visit OT Treatments $Self Care/Home Management : 53-67 mins  Dessie Coma, M.S. OTR/L  11/30/19, 4:01 PM

## 2019-11-30 NOTE — Discharge Summary (Signed)
Physician Discharge Summary  Patient ID: Karen Lewis MRN: [id number] DOB/AGE: Feb 15, 1971 49 y.o.  Admit date: 11/27/2019 Discharge date: 12/05/2019 Admission Diagnoses:  Status post total hip replacement, right [Z96.641]   Discharge Diagnoses: Patient Active Problem List   Diagnosis Date Noted  . History of total hip replacement (Bilateral) 11/27/2019  . Lymphedema 08/20/2019  . Type II diabetes mellitus with complication ([place]) 53/64/6803  . Atypical chest pain 08/13/2019  . Heart palpitations 08/13/2019  . Leg swelling 07/15/2019  . Elevated LFTs 06/07/2019  . Edema of both legs 06/04/2019  . Hypocalcemia 06/04/2019  . Adult failure to thrive syndrome 06/03/2019  . IDA (iron deficiency anemia) 05/28/2019  . Mycobacterium abscessus infection 02/22/2019  . Surgical wound, non healing 09/27/2018  . Wound infection after surgery 08/11/2018  . Panniculitis 07/05/2018  . Allergy to iodine 04/26/2018  . H/O allergy to shellfish 04/26/2018  . Encounter for dental examination 02/21/2018  . Spondylosis without myelopathy or radiculopathy, lumbosacral region 02/21/2018  . History of gastric bypass 02/15/2018  . Depression 01/19/2018  . Secondary Osteoarthritis of knee (Bilateral) (R>L) 01/02/2018  . Encounter for dental exam and cleaning w/o abnormal findings 12/19/2017  . Post op infection 12/05/2017  . Osteoarthritis of hip (Right) 07/07/2017  . Osteoarthritis of shoulder (Left) 05/31/2017  . Upper extremity pain 04/21/2017  . Chronic shoulder pain (Left) 04/21/2017  . Chronic pain syndrome 12/02/2016  . Neurogenic pain 12/02/2016  . Chest pain with low risk of acute coronary syndrome 08/04/2016  . SOB (shortness of breath) on exertion 08/04/2016  . Dysphagia, unspecified 05/04/2016  . Gastroesophageal reflux disease without esophagitis 05/04/2016  . Vitamin D insufficiency 11/27/2015  . Nontoxic goiter 11/12/2015  . Other psychoactive substance dependence, uncomplicated ([place])  21/22/4825  . Controlled drug dependence ([place]) 10/13/2015  . Lumbar spondylosis 09/18/2015  . Hypomagnesemia 08/27/2015  . Morbid obesity with body mass index (BMI) greater than or equal to 70 in adult [facility]) 08/27/2015  . Long term current use of opiate analgesic 07/29/2015  . Long term prescription opiate use 07/29/2015  . Opiate use 07/29/2015  . Encounter for therapeutic drug level monitoring 07/29/2015  . Opiate dependence ([place]) 07/29/2015  . Chronic knee pain (Primary Area of Pain) (Bilateral) (R>L) 07/29/2015  . Chronic low back pain (Third area of Pain) (Bilateral) (R>L) 07/29/2015  . Lumbar facet syndrome (Bilateral) (R>L) 07/29/2015  . Osteoarthritis 07/29/2015  . Grade 1 (1.4 cm) Anterolisthesis of L4 over L5 07/29/2015  . Chronic hip pain (Secondary area of Pain) (Right) 07/29/2015  . S/P THR (total hip replacement) (Left) 07/29/2015  . History of methicillin resistant staphylococcus aureus (MRSA) 07/29/2015  . History of bariatric surgery 07/29/2015  . History of methicillin resistant Staphylococcus aureus infection 07/29/2015  . Eczema 05/21/2015  . Gout 05/21/2015  . Headache, migraine 05/21/2015  . Bilateral polycystic ovarian syndrome 05/21/2015  . Disease of thyroid gland 05/21/2015  . Major depressive disorder, single episode 05/21/2015  . Dermatitis 05/21/2015  . Obstructive apnea 04/30/2015  . History of asthma 03/10/2015  . Binge eating disorder 03/10/2015  . Fibromyalgia 03/10/2015  . Anxiety, generalized 03/10/2015  . Insomnia, persistent 03/10/2015  . Depression, major, recurrent, moderate ([place]) 03/10/2015  . Avitaminosis D 04/30/2014  . Hyperlipidemia 04/25/2014  . Hyperlipidemia due to type 2 diabetes mellitus ([place]) 04/25/2014  . Uncomplicated asthma 00/37/0488  . Asthma 03/19/2014  . Goiter, nontoxic, multinodular 10/09/2013  . Apnea, sleep 12/05/2012  . History of surgical procedure 12/05/2012  . Type 2 diabetes mellitus  without complication,  without long-term current use of insulin ([place]) 12/05/2012  . Hypertension associated with diabetes ([place]) 12/05/2012    Past Medical History:  Diagnosis Date  . Anemia   . Anginal pain ([place])   . Anxiety   . Arthralgia of hip 07/29/2015  . Arthritis   . Arthritis, degenerative 07/29/2015  . Asthma   . Cephalalgia 07/25/2014  . Dependence on unknown drug ([place])    multiplt controlled drug dependence  . Depression   . Diabetes mellitus without complication ([place])   . Dysrhythmia   . Eczema   . Fibromyalgia   . Gastritis   . GERD (gastroesophageal reflux disease)   . Gonalgia 07/29/2015   Overview:  Overview:  The patient has had bilateral intra-articular Hyalgan injections done on 07/16/2014 and although she seems to do well with this type of therapy, apparently her insurance company does not want to pay for they Hyalgan. On 11/27/2014 the patient underwent a bilateral genicular nerve block with excellent results. On 01/28/2015 she had a right knee genicular radiofrequency ablatio  . Gout   . H/O cardiovascular disorder 03/10/2015  . H/O surgical procedure 12/05/2012   Overview:  LSG ([facility] - April 2013)    . H/O thyroid disease 03/10/2015  . Headache   . Herpes   . History of artificial joint 07/29/2015  . History of hiatal hernia   . Hypertension   . Hypomagnesemia   . Hypothyroidism   . IDA (iron deficiency anemia) 05/28/2019  . LBP (low back pain) 07/29/2015  . Neuromuscular disorder ([place])   . Obesity   . PCOS (polycystic ovarian syndrome)   . Primary osteoarthritis of both knees 07/29/2015  . Sleep apnea    not using a Cpap machine at this time  . Thyroid nodule bilateral  . Umbilical hernia      Transfusion: none   Consultants (if any):   Discharged Condition: Improved  Hospital Course: Karen Lewis is an 49 y.o. female who was admitted 11/27/2019 with a diagnosis of right hip osteoarthritis and went to the operating room on 11/27/2019 and underwent the above named  procedures.    Surgeries: Procedure(s): TOTAL HIP ARTHROPLASTY ANTERIOR APPROACH on 11/27/2019 Patient tolerated the surgery well. Taken to PACU where she was stabilized and then transferred to the orthopedic floor.  Started on Lovenox 40 mg q 12 hrs. Foot pumps applied bilaterally at 80 mm. Heels elevated on bed with rolled towels. No evidence of DVT. Negative [place]. Physical therapy started on day #1 for gait training and transfer. OT started day #1 for ADL and assisted devices.  Patient's foley was d/c on day #1. Patient's IV and hemovac was d/c on day #2.  On post op day #3 patient with poor progress with PT. PT recommended SNF. Noted to have 101 temp, CXR and UA ordered and negative for infection.  On postop day 4 and 5 patient made very slow progress with physical therapy.  On postop day 5 patient was given her scheduled narcotics when she became unresponsive.  Narcan was administered and patient immediately became alert.  Blood pressure slightly low.  CNS intact, scheduled narcotics discontinued and blood pressure medications held.   On postop day 6, patient stable.  Continuing to make slow progress of physical therapy.  Needs skilled nursing facility for continued physical therapy.  Plan on discharge to skilled nursing facility on postop day 8.  Implants: Medacta AMIS 2 stem,Mpact 54 mm DM cup and liner with ceramic S 28  mm head  She was given perioperative antibiotics:  Anti-infectives (From admission, onward)   Start     Dose/Rate Route Frequency Ordered Stop   11/27/19 1700  ceFAZolin (ANCEF) 3 g in dextrose 5 % 50 mL IVPB     3 g 100 mL/hr over 30 Minutes Intravenous Every 6 hours 11/27/19 1620 11/28/19 0026   11/27/19 0815  ceFAZolin (ANCEF) 3 g in dextrose 5 % 50 mL IVPB     3 g 100 mL/hr over 30 Minutes Intravenous On call to O.R. 11/27/19 8563 11/27/19 1037    .  She was given sequential compression devices, early ambulation, and Lovenox TEDs for DVT prophylaxis.  She  benefited maximally from the hospital stay and there were no complications.    Recent vital signs:  Vitals:   12/05/19 0453 12/05/19 0726  BP: 107/64 126/67  Pulse: (!) 105 96  Resp:    Temp: 99.2 F (37.3 C) 98.6 F (37 C)  SpO2: 95% 98%    Recent laboratory studies:  Lab Results  Component Value Date   HGB 9.1 (L) 12/05/2019   HGB 9.8 (L) 12/01/2019   HGB 10.3 (L) 11/29/2019   Lab Results  Component Value Date   WBC 9.3 12/05/2019   PLT 349 12/05/2019   Lab Results  Component Value Date   INR 0.95 05/07/2015   Lab Results  Component Value Date   NA 138 12/05/2019   K 4.6 12/05/2019   CL 107 12/05/2019   CO2 24 12/05/2019   BUN 26 (H) 12/05/2019   CREATININE 0.90 12/05/2019   GLUCOSE 164 (H) 12/05/2019    Discharge Medications:   Allergies as of 12/05/2019      Reactions   Bactrim [sulfamethoxazole-trimethoprim] Hives   Omalizumab Itching, Hives   Ciprofloxacin Other (See Comments)   myalgia   Shellfish Allergy Other (See Comments)   unknown   Clindamycin Rash   Nsaids Nausea Only, Other (See Comments)   Stomach upset      Medication List    STOP taking these medications   Aleve 220 MG tablet Generic drug: naproxen sodium   doxycycline 100 MG tablet Commonly known as: VIBRA-TABS   levofloxacin 500 MG tablet Commonly known as: LEVAQUIN   levofloxacin 750 MG tablet Commonly known as: Levaquin     TAKE these medications   Accu-Chek Aviva Plus test strip Generic drug: glucose blood Use 2 (two) times daily   Accu-Chek Aviva Plus test strip Generic drug: glucose blood   acetaminophen 650 MG CR tablet Commonly known as: TYLENOL Take 650-1,300 mg by mouth every 8 (eight) hours as needed for pain.   acidophilus Caps capsule Take 1 capsule by mouth daily.   albuterol 108 (90 Base) MCG/ACT inhaler Commonly known as: VENTOLIN HFA Inhale 2 puffs into the lungs every 6 (six) hours as needed for wheezing or shortness of breath.    amitriptyline 100 MG tablet Commonly known as: ELAVIL Take 100 mg by mouth at bedtime.   B-12 2500 MCG Tabs Take 2,500 mcg by mouth daily.   Biotin 10 MG Caps Take 10 mg by mouth daily.   butalbital-acetaminophen-caffeine 50-325-40 MG tablet Commonly known as: FIORICET Take 1 tablet by mouth every 6 (six) hours as needed for migraine.   calcium carbonate 750 MG chewable tablet Commonly known as: TUMS EX Chew 1 tablet by mouth daily.   clonazePAM 0.5 MG tablet Commonly known as: KLONOPIN Take 1 tablet (0.5 mg total) by mouth 3 (three) times daily as  needed for anxiety.   diclofenac sodium 1 % Gel Commonly known as: Voltaren Apply 2 g topically 4 (four) times daily.   diphenhydrAMINE 25 MG tablet Commonly known as: BENADRYL Take 25 mg by mouth every 8 (eight) hours as needed for itching or allergies.   enoxaparin 40 MG/0.4ML injection Commonly known as: LOVENOX Inject 0.4 mLs (40 mg total) into the skin daily.   EPINEPHrine 0.3 mg/0.3 mL Soaj injection Commonly known as: EPI-PEN Inject 0.3 mg into the muscle as needed for anaphylaxis.   ergocalciferol 1.25 MG (50000 UT) capsule Commonly known as: VITAMIN D2 Take 50,000 Units by mouth 2 (two) times a week.   famotidine 20 MG tablet Commonly known as: PEPCID Take 20 mg by mouth daily as needed for heartburn or indigestion.   gentamicin ointment 0.1 % Commonly known as: GARAMYCIN Apply 1 application topically daily.   levocetirizine 5 MG tablet Commonly known as: XYZAL Take 5 mg by mouth at bedtime.   levothyroxine 125 MCG tablet Commonly known as: SYNTHROID Take 250 mcg by mouth daily before breakfast.   lisinopril 5 MG tablet Commonly known as: ZESTRIL Take 5 mg by mouth at bedtime.   magnesium oxide 400 MG tablet Commonly known as: MAG-OX Take 800-1,200 mg by mouth See admin instructions. Take 1200 mg by mouth in the morning and 800 mg at bedtime   meclizine 25 MG tablet Commonly known as:  ANTIVERT Take 25 mg by mouth 3 (three) times daily as needed for dizziness.   medroxyPROGESTERone 150 MG/ML injection Commonly known as: DEPO-PROVERA Inject 150 mg into the muscle every 3 (three) months.   metFORMIN 1000 MG tablet Commonly known as: GLUCOPHAGE Take 1,000 mg by mouth 2 (two) times daily with a meal.   metoprolol tartrate 50 MG tablet Commonly known as: LOPRESSOR Take 75 mg by mouth 2 (two) times daily.   Multi-Vitamin tablet Take 2 tablets by mouth daily.   naloxone 4 MG/0.1ML Liqd nasal spray kit Commonly known as: Narcan Spray into one nostril. Repeat with second device into other nostril after 2-3 minutes if no or minimal response. What changed:   how much to take  how to take this  when to take this   naphazoline-pheniramine 0.025-0.3 % ophthalmic solution Commonly known as: NAPHCON-A Place 1 drop into both eyes 4 (four) times daily as needed for eye irritation.   Nasacort AQ 55 MCG/ACT Aero nasal inhaler Generic drug: triamcinolone Place 2 sprays into the nose 2 (two) times daily as needed (allergies).   omeprazole 40 MG capsule Commonly known as: PRILOSEC Take 40 mg by mouth in the morning and at bedtime.   oxyCODONE 5 MG immediate release tablet Commonly known as: Oxy IR/ROXICODONE Take 1 tablet (5 mg total) by mouth 5 (five) times daily. Must last 30 days Start taking on: December 06, 2019 What changed: Another medication with the same name was removed. Continue taking this medication, and follow the directions you see here.   pregabalin 225 MG capsule Commonly known as: LYRICA Take 1 capsule (225 mg total) by mouth 2 (two) times daily.   QUEtiapine 100 MG tablet Commonly known as: SEROQUEL Take 100 mg by mouth at bedtime.   rosuvastatin 40 MG tablet Commonly known as: CRESTOR Take 40 mg by mouth daily.   sertraline 100 MG tablet Commonly known as: ZOLOFT Take 200 mg by mouth daily.   Singulair 10 MG tablet Generic drug:  montelukast Take 10 mg by mouth at bedtime.   spironolactone 25 MG  tablet Commonly known as: ALDACTONE Take 25 mg by mouth 2 (two) times daily.   SUPER B COMPLEX/C PO Take 1 tablet by mouth at bedtime.   tiZANidine 4 MG tablet Commonly known as: ZANAFLEX Take 4 mg by mouth 2 (two) times daily as needed for muscle spasms. What changed: Another medication with the same name was added. Make sure you understand how and when to take each.   tiZANidine 4 MG tablet Commonly known as: ZANAFLEX Take 1 tablet (4 mg total) by mouth 2 (two) times daily as needed for muscle spasms. What changed: You were already taking a medication with the same name, and this prescription was added. Make sure you understand how and when to take each.   vitamin C 1000 MG tablet Take 1,000 mg by mouth daily.   zolpidem 10 MG tablet Commonly known as: AMBIEN Take 10 mg by mouth at bedtime as needed for sleep.   zonisamide 50 MG capsule Commonly known as: ZONEGRAN Take 150 mg by mouth at bedtime.            Durable Medical Equipment  (From admission, onward)         Start     Ordered   11/27/19 1621  DME Walker rolling  Once    Comments: Bariatric  Question Answer Comment  Walker: With 5 Inch Wheels   Patient needs a walker to treat with the following condition Status post total hip replacement, right      11/27/19 1620   11/27/19 1621  DME 3 n 1  Once     11/27/19 1620   11/27/19 1621  DME Bedside commode  Once    Comments: Bariatric  Question:  Patient needs a bedside commode to treat with the following condition  Answer:  Status post total hip replacement, right   11/27/19 1620          Diagnostic Studies: DG Chest 2 View  Result Date: 11/30/2019 CLINICAL DATA:  Fever EXAM: CHEST - 2 VIEW COMPARISON:  05/29/2019 FINDINGS: Prominence of the cardiac silhouette is likely accentuated by portable technique and slightly decreased lung volumes. There is pulmonary vascular congestion without  frank interstitial edema. No focal airspace consolidation. No pleural effusion or pneumothorax. IMPRESSION: Pulmonary vascular congestion. No focal airspace consolidation. Electronically Signed   By: Davina Poke D.O.   On: 11/30/2019 13:17   DG HIP PORT UNILAT WITH PELVIS 1V RIGHT  Result Date: 11/29/2019 CLINICAL DATA:  Postop right hip pain. EXAM: DG HIP (WITH OR WITHOUT PELVIS) 1V PORT RIGHT COMPARISON:  11/27/2019 and 04/26/2019 FINDINGS: Examination demonstrates no change in patient's left total hip arthroplasty. Right hip arthroplasty is intact and unchanged. There are degenerative changes of the spine. IMPRESSION: Bilateral hip arthroplasties intact and unchanged. Electronically Signed   By: Marin Olp M.D.   On: 11/29/2019 13:39   DG HIP OPERATIVE UNILAT W OR W/O PELVIS RIGHT  Result Date: 11/27/2019 CLINICAL DATA:  Right hip replacement. EXAM: OPERATIVE right HIP (WITH PELVIS IF PERFORMED) 2. VIEWS TECHNIQUE: Fluoroscopic spot image(s) were submitted for interpretation post-operatively. Radiation exposure index: 12.4 mGy. COMPARISON:  None. FINDINGS: Two intraoperative fluoroscopic images were obtained of the right hip. The right femoral and acetabular components appear to be well situated. IMPRESSION: Status post right total hip arthroplasty. Electronically Signed   By: Marijo Conception M.D.   On: 11/27/2019 13:45   DG HIP UNILAT W OR W/O PELVIS 2-3 VIEWS RIGHT  Result Date: 11/27/2019 CLINICAL DATA:  Right  total hip replacement. EXAM: DG HIP (WITH OR WITHOUT PELVIS) 2-3V RIGHT COMPARISON:  April 26, 2019. FINDINGS: The right acetabular and femoral components appear to be well situated. Expected postoperative changes are seen in the surrounding soft tissues. IMPRESSION: Status post right total hip arthroplasty. Electronically Signed   By: Marijo Conception M.D.   On: 11/27/2019 13:55    Disposition: Discharge disposition: 03-Skilled [place]  information for follow-up providers    Duanne Guess, PA-C Follow up in 2 day(s).   Specialties: Orthopedic Surgery, Emergency Medicine Contact information: [place] [address] [phone]            Contact information for after-discharge care    Destination    [facility] Preferred SNF .   Service: Skilled Nursing Contact information: [address] [place] [phone]                   Signed: Feliberto Gottron 12/05/2019, 1:34 PM

## 2019-11-30 NOTE — Discharge Instructions (Signed)
ANTERIOR APPROACH TOTAL HIP REPLACEMENT POSTOPERATIVE DIRECTIONS   Hip Rehabilitation, Guidelines Following Surgery  The results of a hip operation are greatly improved after range of motion and muscle strengthening exercises. Follow all safety measures which are given to protect your hip. If any of these exercises cause increased pain or swelling in your joint, decrease the amount until you are comfortable again. Then slowly increase the exercises. Call your caregiver if you have problems or questions.   HOME CARE INSTRUCTIONS  Remove items at home which could result in a fall. This includes throw rugs or furniture in walking pathways.   ICE to the affected hip every three hours for 30 minutes at a time and then as needed for pain and swelling.  Continue to use ice on the hip for pain and swelling from surgery. You may notice swelling that will progress down to the foot and ankle.  This is normal after surgery.  Elevate the leg when you are not up walking on it.    Continue to use the breathing machine which will help keep your temperature down.  It is common for your temperature to cycle up and down following surgery, especially at night when you are not up moving around and exerting yourself.  The breathing machine keeps your lungs expanded and your temperature down.  Do not place pillow under knee, focus on keeping the knee straight while resting  DIET You may resume your previous home diet once your are discharged from the hospital.   DRESSING / WOUND CARE / SHOWERING Please remove provena negative pressure dressing on 12/09/2019 and apply honey comb dressing. Keep dressing clean and dry at all times.    ACTIVITY Walk with your walker as instructed. Use walker as long as suggested by your caregivers. Avoid periods of inactivity such as sitting longer than an hour when not asleep. This helps prevent blood clots.  You may resume a sexual relationship in one month or when given the OK  by your doctor.  You may return to work once you are cleared by your doctor.  Do not drive a car for 6 weeks or until released by you surgeon.  Do not drive while taking narcotics.  WEIGHT BEARING 50% Weight bearing with walker x 4 weeks  POSTOPERATIVE CONSTIPATION PROTOCOL Constipation - defined medically as fewer than three stools per week and severe constipation as less than one stool per week.  One of the most common issues patients have following surgery is constipation.  Even if you have a regular bowel pattern at home, your normal regimen is likely to be disrupted due to multiple reasons following surgery.  Combination of anesthesia, postoperative narcotics, change in appetite and fluid intake all can affect your bowels.  In order to avoid complications following surgery, here are some recommendations in order to help you during your recovery period.  Colace (docusate) - Pick up an over-the-counter form of Colace or another stool softener and take twice a day as long as you are requiring postoperative pain medications.  Take with a full glass of water daily.  If you experience loose stools or diarrhea, hold the colace until you stool forms back up.  If your symptoms do not get better within 1 week or if they get worse, check with your doctor.  Dulcolax (bisacodyl) - Pick up over-the-counter and take as directed by the product packaging as needed to assist with the movement of your bowels.  Take with a full glass of water.  Use this product as needed if not relieved by Colace only.   MiraLax (polyethylene glycol) - Pick up over-the-counter to have on hand.  MiraLax is a solution that will increase the amount of water in your bowels to assist with bowel movements.  Take as directed and can mix with a glass of water, juice, soda, coffee, or tea.  Take if you go more than two days without a movement. Do not use MiraLax more than once per day. Call your doctor if you are still constipated or  irregular after using this medication for 7 days in a row.  If you continue to have problems with postoperative constipation, please contact the office for further assistance and recommendations.  If you experience "the worst abdominal pain ever" or develop nausea or vomiting, please contact the office immediatly for further recommendations for treatment.  ITCHING  If you experience itching with your medications, try taking only a single pain pill, or even half a pain pill at a time.  You can also use Benadryl over the counter for itching or also to help with sleep.   TED HOSE STOCKINGS Wear the elastic stockings on both legs for six weeks following surgery during the day but you may remove then at night for sleeping.  MEDICATIONS See your medication summary on the "After Visit Summary" that the nursing staff will review with you prior to discharge.  You may have some home medications which will be placed on hold until you complete the course of blood thinner medication.  It is important for you to complete the blood thinner medication as prescribed by your surgeon.  Continue your approved medications as instructed at time of discharge.  PRECAUTIONS If you experience chest pain or shortness of breath - call 911 immediately for transfer to the hospital emergency department.  If you develop a fever greater that 101 F, purulent drainage from wound, increased redness or drainage from wound, foul odor from the wound/dressing, or calf pain - CONTACT YOUR SURGEON.                                                   FOLLOW-UP APPOINTMENTS Make sure you keep all of your appointments after your operation with your surgeon and caregivers. You should call the office at the above phone number and make an appointment for approximately two weeks after the date of your surgery or on the date instructed by your surgeon outlined in the "After Visit Summary".  RANGE OF MOTION AND STRENGTHENING EXERCISES  These  exercises are designed to help you keep full movement of your hip joint. Follow your caregiver's or physical therapist's instructions. Perform all exercises about fifteen times, three times per day or as directed. Exercise both hips, even if you have had only one joint replacement. These exercises can be done on a training (exercise) mat, on the floor, on a table or on a bed. Use whatever works the best and is most comfortable for you. Use music or television while you are exercising so that the exercises are a pleasant break in your day. This will make your life better with the exercises acting as a break in routine you can look forward to.  Lying on your back, slowly slide your foot toward your buttocks, raising your knee up off the floor. Then slowly slide your foot back  down until your leg is straight again.  Lying on your back spread your legs as far apart as you can without causing discomfort.  Lying on your side, raise your upper leg and foot straight up from the floor as far as is comfortable. Slowly lower the leg and repeat.  Lying on your back, tighten up the muscle in the front of your thigh (quadriceps muscles). You can do this by keeping your leg straight and trying to raise your heel off the floor. This helps strengthen the largest muscle supporting your knee.  Lying on your back, tighten up the muscles of your buttocks both with the legs straight and with the knee bent at a comfortable angle while keeping your heel on the floor.   IF YOU ARE TRANSFERRED TO A SKILLED REHAB FACILITY If the patient is transferred to a skilled rehab facility following release from the hospital, a list of the current medications will be sent to the facility for the patient to continue.  When discharged from the skilled rehab facility, please have the facility set up the patient's [place] prior to being released. Also, the skilled facility will be responsible for providing the patient with their  medications at time of release from the facility to include their pain medication, the muscle relaxants, and their blood thinner medication. If the patient is still at the rehab facility at time of the two week follow up appointment, the skilled rehab facility will also need to assist the patient in arranging follow up appointment in our office and any transportation needs.  MAKE SURE YOU:  Understand these instructions.  Get help right away if you are not doing well or get worse.    Pick up stool softner and laxative for home use following surgery while on pain medications. Continue to use ice for pain and swelling after surgery. Do not use any lotions or creams on the incision until instructed by your surgeon.

## 2019-12-01 LAB — GLUCOSE, CAPILLARY
Glucose-Capillary: 152 mg/dL — ABNORMAL HIGH (ref 70–99)
Glucose-Capillary: 121 mg/dL — ABNORMAL HIGH (ref 70–99)
Glucose-Capillary: 205 mg/dL — ABNORMAL HIGH (ref 70–99)

## 2019-12-01 LAB — CBC
HCT: 30.6 % — ABNORMAL LOW (ref 36.0–46.0)
Hemoglobin: 9.8 g/dL — ABNORMAL LOW (ref 12.0–15.0)
MCH: 26.6 pg (ref 26.0–34.0)
MCHC: 32 g/dL (ref 30.0–36.0)
MCV: 82.9 fL (ref 80.0–100.0)
Platelets: 251 10*3/uL (ref 150–400)
RBC: 3.69 MIL/uL — ABNORMAL LOW (ref 3.87–5.11)
RDW: 15.4 % (ref 11.5–15.5)
WBC: 11.1 10*3/uL — ABNORMAL HIGH (ref 4.0–10.5)
nRBC: 0 % (ref 0.0–0.2)

## 2019-12-01 NOTE — Progress Notes (Signed)
Physical Therapy Treatment Patient Details Name: Karen Lewis MRN: [id number] DOB: Feb 02, 1971 Today's Date: 12/01/2019    History of Present Illness Per MD notes: Pt is a 49 y/o F with R hip OA s/p elective R THA.  PMH includes morbid obesity, HTN, DM, Depression, anxiety, asthma, fibromyalgia, L THA, R shoulder arthroplasty, bilat carpal tunnel release, GERD, cardiovascular disease, PCOS, and bilat knee OA.    PT Comments    Pt was long sityting in bed upon arriving. Her bed is soaked from incontinent episode. Pt requesting to try to get on/off BSC. She required incraesed time to perform all mobility 2/2 to pain and +2 assist for safety throughout. She only required +1 to perform all task but 2nd person in room throughout. Pt used trapeze bar to assist with bed mobility + mod assist to fully achieve EOB short sit. She was able to stand 3 x throughout session and able to pivot on off BSC however was unable to adhere to [facility] status. Therapist reviewed there ex and hip precautions however will need reinforcement in future sessions. Pt was repositioned to [place] post session with RN/RN tech both in room. Overall pt tolerated session well and is progressing with strength and mobility but will need SNF at DC to address all deficits and assist pt in returning to [facility].        Follow Up Recommendations  SNF     Equipment Recommendations  3in1 (PT);Other (comment)(bariatric)    Recommendations for Other Services       Precautions / Restrictions Precautions Precautions: Anterior Hip Precaution Booklet Issued: No Restrictions Weight Bearing Restrictions: Yes RLE Weight Bearing: Partial weight bearing RLE Partial Weight Bearing Percentage or Pounds: 50%    Mobility  Bed Mobility Overal bed mobility: Needs Assistance Bed Mobility: Supine to Sit;Sit to Supine Rolling: Mod assist;+2 for safety/equipment   Supine to sit: Mod assist Sit to supine: Max assist   General bed mobility comments: Pt  used trap. bar + Mod assist to progress to EOB short sitting. pt very slow moving and likes to perform movements without assistance. Vcs throughout for adhereing to hip precautions.  Transfers Overall transfer level: Needs assistance Equipment used: Rolling walker (2 wheeled)(Bariatric) Transfers: Sit to/from Stand;Stand Pivot Transfers Sit to Stand: +2 safety/equipment;From elevated surface;Min assist Stand pivot transfers: Min assist;+2 safety/equipment;From elevated surface       General transfer comment: pt stood 1 x EOB and 2 x from [facility]. max vcs for PWB status hwoever pt unable to perform. She was able to get on/off BSC but reports 9/10 pain. once repositioned in bed after using BSC, pt unwilling to perform more ther ex. Therapist did review ther ex to be performing to promote strengthening.  Ambulation/Gait             General Gait Details: unable to progress 2/2 to inability to maintain proper PWB status in standing/transfers   Stairs             Wheelchair Mobility    Modified Rankin (Stroke Patients Only)       Balance                                            Cognition Arousal/Alertness: Awake/alert Behavior During Therapy: WFL for tasks assessed/performed Overall Cognitive Status: Within Functional Limits for tasks assessed  General Comments: Pt pleasant, conversational, A&O x4 during session.      Exercises      General Comments        Pertinent Vitals/Pain Pain Assessment: 0-10 Pain Score: 8  Faces Pain Scale: Hurts even more Pain Location: [name] Descriptors / Indicators: Sore;Sharp;Constant;Grimacing Pain Intervention(s): Limited activity within patient's tolerance;Monitored during session;Premedicated before session;Repositioned    Home Living                      Prior Function            PT Goals (current goals can now be found in the care plan  section) Acute Rehab PT Goals Patient Stated Goal: To get better so I can go home after rehab" Progress towards PT goals: Progressing toward goals    Frequency    BID      PT Plan Current plan remains appropriate    Co-evaluation              AM-PAC PT "6 Clicks" Mobility   Outcome Measure  Help needed turning from your back to your side while in a flat bed without using bedrails?: A Lot Help needed moving from lying on your back to sitting on the side of a flat bed without using bedrails?: A Lot Help needed moving to and from a bed to a chair (including a wheelchair)?: A Lot Help needed standing up from a chair using your arms (e.g., wheelchair or bedside chair)?: A Lot Help needed to walk in hospital room?: Total Help needed climbing 3-5 steps with a railing? : Total 6 Click Score: 10    End of Session Equipment Utilized During Treatment: Gait belt Activity Tolerance: Patient tolerated treatment well;Patient limited by pain Patient left: in bed;with nursing/sitter in room;with call bell/phone within reach Nurse Communication: Mobility status PT Visit Diagnosis: Other abnormalities of gait and mobility (R26.89);Muscle weakness (generalized) (M62.81);Pain Pain - Right/Left: Right Pain - part of body: Hip;Leg     Time: [id number] PT Time Calculation (min) (ACUTE ONLY): 33 min  Charges:  $Therapeutic Activity: 23-37 mins                     Julaine Fusi PTA 12/01/19, 1:56 PM

## 2019-12-01 NOTE — Progress Notes (Signed)
D: Pt alert and oriented x 4. Pt mood/affect appears as iritatable/agitated.  Pt reports experiencing pain this morning, pain medication along with BP medication had to be held r/t low BP. It was explained to pt certain medication had to be held r/t low BP and that once BP became w/in a normal range this writer would bring her pain medications. Other methods to ease/relief pain was offered to pt. Pain management meds were given once BP came up to normal range. During after this Probation officer requested assistance from PT in getting pt up so that this writer and the NT could provide pt with a clean linen change d/t pt saturating bed. The process to take place was presented to pt who then stated to NT no, it's not PT's job that this Probation officer and the NT could just do it. Once presented again with PT pt complied and pt received full lined and gown change as well as peri care. When this writer inquired if a catch hat was placed in [facility] to NT and if one could be place, pt stated why, when explained it was to obtain a UA, PT stated that's not a clean catch and that it needs to be an I/O cath and that she told the doctor this. It was explain to pt that if one can sit on [facility] and urinate that would be the best and preferred way, it's not preferable to preform and I/O cath d/t the possibility of introducing bacteria and a catch hat is less invasive. It was also brought to pt's attention that it was not the preferred way unless a pt is experiencing urinary retention. Pt became very loud, boisterous, and was yelling at this writer mid morning r/t pain and not having any pain meds since pt stated 3 AM. When reiterated that pt had a low BP, however it is now w/in range and this writer has brought pain meds. Pt asked what this Probation officer brought, this Probation officer replied 15 mg of oxycodone. Pt then shouted multiple times that she wanted a pain shot and that it was ordered refusing all other meds until a pain shot was received to include insulin. At  this point this Probation officer contacted the PA Fowler, explain pt scenario and received orders from [place]. Pt later did comply with evening medications.  A: Scheduled medications administered to pt, per MD orders. Support and encouragement provided. Frequent verbal contact made.    R: No adverse drug reactions noted. Will continue to monitor and provide care for as ordered.

## 2019-12-01 NOTE — Plan of Care (Signed)
  Problem: Education: Goal: Knowledge of the prescribed therapeutic regimen will improve Outcome: Progressing Goal: Understanding of discharge needs will improve Outcome: Progressing Goal: Individualized Educational Video(s) Outcome: Progressing   Problem: Activity: Goal: Ability to avoid complications of mobility impairment will improve Outcome: Progressing Goal: Ability to tolerate increased activity will improve Outcome: Progressing   Problem: Clinical Measurements: Goal: Postoperative complications will be avoided or minimized Outcome: Progressing   Problem: Education: Goal: Knowledge of General Education information will improve Description: Including pain rating scale, medication(s)/side effects and non-pharmacologic comfort measures Outcome: Progressing   Problem: Health Behavior/Discharge Planning: Goal: Ability to manage health-related needs will improve Outcome: Progressing   Problem: Clinical Measurements: Goal: Ability to maintain clinical measurements within normal limits will improve Outcome: Progressing Goal: Will remain free from infection Outcome: Progressing Goal: Diagnostic test results will improve Outcome: Progressing Goal: Respiratory complications will improve Outcome: Progressing Goal: Cardiovascular complication will be avoided Outcome: Progressing   Problem: Activity: Goal: Risk for activity intolerance will decrease Outcome: Progressing   Problem: Nutrition: Goal: Adequate nutrition will be maintained Outcome: Progressing   Problem: Coping: Goal: Level of anxiety will decrease Outcome: Progressing   Problem: Elimination: Goal: Will not experience complications related to bowel motility Outcome: Progressing Goal: Will not experience complications related to urinary retention Outcome: Progressing   Problem: Safety: Goal: Ability to remain free from injury will improve Outcome: Progressing   Problem: Skin Integrity: Goal: Risk for  impaired skin integrity will decrease Outcome: Progressing

## 2019-12-01 NOTE — Progress Notes (Signed)
PT Cancellation Note  Patient Details Name: Karen Lewis MRN: [id number] DOB: [birth year]   Cancelled Treatment:     PT attempt. Pt refused. Reporting 9/10 pain. Unable to have pain meds at this time 2/2 to low BP 97/54 and HR 116 bpm. Therapist will return later this date per POC.    Willette Pa 12/01/2019, 11:10 AM

## 2019-12-01 NOTE — Progress Notes (Signed)
Subjective: 4 Days Post-Op Procedure(s) (LRB): TOTAL HIP ARTHROPLASTY ANTERIOR APPROACH (Right) Patient reports pain as mild to moderate No dizziness or lightheadedness.  No nausea or vomiting.  Denies any CP, SOB, ABD pain. We will continue with therapy today.  Current plan is for d/c to SNF when able.   Bed search was initiated yesterday.  Objective: Vital signs in last 24 hours: Temp:  [99.9 F (37.7 C)-100.2 F (37.9 C)] 99.9 F (37.7 C) (03/19 2104) Pulse Rate:  [93-111] 110 (03/20 0541) Resp:  [16-17] 16 (03/20 0541) BP: (118-134)/(65-82) 129/67 (03/20 0541) SpO2:  [96 %-99 %] 97 % (03/20 0541)  Intake/Output from previous day: 03/19 0701 - 03/20 0700 In: 480 [P.O.:480] Out: 1800 [Urine:1800] Intake/Output this shift: No intake/output data recorded.  Recent Labs    11/29/19 0240  HGB 10.3*   Recent Labs    11/29/19 0240  WBC 13.7*  RBC 3.84*  HCT 32.1*  PLT 224   No results for input(s): NA, K, CL, CO2, BUN, CREATININE, GLUCOSE, CALCIUM in the last 72 hours. No results for input(s): LABPT, INR in the last 72 hours.  EXAM General - Patient is Alert, Appropriate and Oriented Extremity - Neurovascular intact Sensation intact distally Intact pulses distally Dorsiflexion/Plantar flexion intact No cellulitis present Dressing - dressing C/D/I and no drainage, Praveena intact without drainage.  Motor Function - intact, moving foot and toes well on exam.   Past Medical History:  Diagnosis Date  . Anemia   . Anginal pain ([place])   . Anxiety   . Arthralgia of hip 07/29/2015  . Arthritis   . Arthritis, degenerative 07/29/2015  . Asthma   . Cephalalgia 07/25/2014  . Dependence on unknown drug ([place])    multiplt controlled drug dependence  . Depression   . Diabetes mellitus without complication ([place])   . Dysrhythmia   . Eczema   . Fibromyalgia   . Gastritis   . GERD (gastroesophageal reflux disease)   . Gonalgia 07/29/2015   Overview:  Overview:  The  patient has had bilateral intra-articular Hyalgan injections done on 07/16/2014 and although she seems to do well with this type of therapy, apparently her insurance company does not want to pay for they Hyalgan. On 11/27/2014 the patient underwent a bilateral genicular nerve block with excellent results. On 01/28/2015 she had a right knee genicular radiofrequency ablatio  . Gout   . H/O cardiovascular disorder 03/10/2015  . H/O surgical procedure 12/05/2012   Overview:  LSG ([facility] - April 2013)    . H/O thyroid disease 03/10/2015  . Headache   . Herpes   . History of artificial joint 07/29/2015  . History of hiatal hernia   . Hypertension   . Hypomagnesemia   . Hypothyroidism   . IDA (iron deficiency anemia) 05/28/2019  . LBP (low back pain) 07/29/2015  . Neuromuscular disorder ([place])   . Obesity   . PCOS (polycystic ovarian syndrome)   . Primary osteoarthritis of both knees 07/29/2015  . Sleep apnea    not using a Cpap machine at this time  . Thyroid nodule bilateral  . Umbilical hernia     Assessment/Plan:   4 Days Post-Op Procedure(s) (LRB): TOTAL HIP ARTHROPLASTY ANTERIOR APPROACH (Right) Active Problems:   Status post total hip replacement, right  Estimated body mass index is 65.24 kg/m as calculated from the following:   Height as of this encounter: 5\' 4"  (1.626 m).   Weight as of this encounter: 172.4 kg. Advance diet Up with  therapy, 50% weightbearing right lower extremity  CBC has been ordered for this morning. Patient has had a BM. Temp 99.9 last night with HR 110. CXR without acute abnormalities. Will obtain a UA today to check for acute UTI. Encourage incentive spirometer Care management to assist with discharge to SNF.  DVT Prophylaxis - Lovenox, Foot Pumps and TED hose 50% weightbearing right lower extremity  J. Cameron Proud, PA-C [facility] 12/01/2019, 8:45 AM

## 2019-12-02 LAB — GLUCOSE, CAPILLARY
Glucose-Capillary: 161 mg/dL — ABNORMAL HIGH (ref 70–99)
Glucose-Capillary: 166 mg/dL — ABNORMAL HIGH (ref 70–99)
Glucose-Capillary: 150 mg/dL — ABNORMAL HIGH (ref 70–99)
Glucose-Capillary: 186 mg/dL — ABNORMAL HIGH (ref 70–99)

## 2019-12-02 LAB — MAGNESIUM: Magnesium: 1.8 mg/dL (ref 1.7–2.4)

## 2019-12-02 MED ORDER — OXYCODONE HCL 5 MG PO TABS
10.0000 mg | ORAL_TABLET | ORAL | Status: DC
  Administered 2019-12-02 (×4): 15 mg via ORAL
  Filled 2019-12-02 (×5): qty 3

## 2019-12-02 MED ORDER — KETOROLAC TROMETHAMINE 60 MG/2ML IM SOLN
60.0000 mg | Freq: Once | INTRAMUSCULAR | Status: AC
  Administered 2019-12-02: 60 mg via INTRAMUSCULAR
  Filled 2019-12-02: qty 2

## 2019-12-02 MED ORDER — TIZANIDINE HCL 4 MG PO TABS
8.0000 mg | ORAL_TABLET | Freq: Three times a day (TID) | ORAL | Status: DC | PRN
  Administered 2019-12-02 (×2): 8 mg via ORAL
  Filled 2019-12-02 (×4): qty 2

## 2019-12-02 MED ORDER — TRAMADOL HCL 50 MG PO TABS: 50.0000 mg | ORAL_TABLET | Freq: Four times a day (QID) | ORAL | Status: DC

## 2019-12-02 MED ORDER — TRAMADOL HCL 50 MG PO TABS
50.0000 mg | ORAL_TABLET | Freq: Four times a day (QID) | ORAL | Status: DC | PRN
  Administered 2019-12-02 (×2): 100 mg via ORAL
  Administered 2019-12-04: 50 mg via ORAL
  Filled 2019-12-02 (×2): qty 2
  Filled 2019-12-02: qty 1

## 2019-12-02 NOTE — Progress Notes (Addendum)
Subjective: 5 Days Post-Op Procedure(s) (LRB): TOTAL HIP ARTHROPLASTY ANTERIOR APPROACH (Right) Patient reports pain as moderate in the right hip. No dizziness or lightheadedness.  No nausea or vomiting.  Denies any CP, SOB, ABD pain. We will continue with therapy today.  Current plan is for d/c to SNF when able.  Likely discharge to SNF tomorrow.  Objective: Vital signs in last 24 hours: Temp:  [98.5 F (36.9 C)-99.1 F (37.3 C)] 99.1 F (37.3 C) (03/21 0741) Pulse Rate:  [85-120] 85 (03/21 0741) Resp:  [17-18] 18 (03/21 0741) BP: (116-146)/(65-93) 116/65 (03/21 0741) SpO2:  [97 %-100 %] 100 % (03/21 0741)  Intake/Output from previous day: 03/20 0701 - 03/21 0700 In: -  Out: 1300 [Urine:1300] Intake/Output this shift: Total I/O In: -  Out: 400 [Urine:400]  Recent Labs    12/01/19 0814  HGB 9.8*   Recent Labs    12/01/19 0814  WBC 11.1*  RBC 3.69*  HCT 30.6*  PLT 251   No results for input(s): NA, K, CL, CO2, BUN, CREATININE, GLUCOSE, CALCIUM in the last 72 hours. No results for input(s): LABPT, INR in the last 72 hours.  EXAM General - Patient is Alert, Appropriate and Oriented Extremity - Neurovascular intact Sensation intact distally Intact pulses distally Dorsiflexion/Plantar flexion intact No cellulitis present Dressing - dressing C/D/I and no drainage, Praveena intact without drainage.  Motor Function - intact, moving foot and toes well on exam.   Past Medical History:  Diagnosis Date  . Anemia   . Anginal pain ([place])   . Anxiety   . Arthralgia of hip 07/29/2015  . Arthritis   . Arthritis, degenerative 07/29/2015  . Asthma   . Cephalalgia 07/25/2014  . Dependence on unknown drug ([place])    multiplt controlled drug dependence  . Depression   . Diabetes mellitus without complication ([place])   . Dysrhythmia   . Eczema   . Fibromyalgia   . Gastritis   . GERD (gastroesophageal reflux disease)   . Gonalgia 07/29/2015   Overview:  Overview:  The  patient has had bilateral intra-articular Hyalgan injections done on 07/16/2014 and although she seems to do well with this type of therapy, apparently her insurance company does not want to pay for they Hyalgan. On 11/27/2014 the patient underwent a bilateral genicular nerve block with excellent results. On 01/28/2015 she had a right knee genicular radiofrequency ablatio  . Gout   . H/O cardiovascular disorder 03/10/2015  . H/O surgical procedure 12/05/2012   Overview:  LSG ([facility] - April 2013)    . H/O thyroid disease 03/10/2015  . Headache   . Herpes   . History of artificial joint 07/29/2015  . History of hiatal hernia   . Hypertension   . Hypomagnesemia   . Hypothyroidism   . IDA (iron deficiency anemia) 05/28/2019  . LBP (low back pain) 07/29/2015  . Neuromuscular disorder ([place])   . Obesity   . PCOS (polycystic ovarian syndrome)   . Primary osteoarthritis of both knees 07/29/2015  . Sleep apnea    not using a Cpap machine at this time  . Thyroid nodule bilateral  . Umbilical hernia    Assessment/Plan:   5 Days Post-Op Procedure(s) (LRB): TOTAL HIP ARTHROPLASTY ANTERIOR APPROACH (Right) Active Problems:   Status post total hip replacement, right  Estimated body mass index is 65.24 kg/m as calculated from the following:   Height as of this encounter: 5\' 4"  (1.626 m).   Weight as of this encounter: 172.4 kg.  Advance diet Up with therapy, 50% weightbearing right lower extremity  Patient has had a BM. Temp 99.1 last night. Reports increased pain in the right hip.  Does appear that she missed a oxycodone dose at 8 this AM. Spoke with patient's nurse.  I have changed the Oxycodone 10-15mg  dose from prn to a scheduled medication. Patient upset with me that I have discontinued Dilaudid IV.  Instructed her that after the first 24-48 hours following surgery we try to come off of all IV pain medication. Added one time toradol injection, adjusted muscle relaxer for patient and added  tramadol. Requesting that I check magnesium. CXR without acute abnormalities. Encourage incentive spirometer Care management to assist with discharge to SNF. Plan for discharge to SNF this tomorrow.  DVT Prophylaxis - Lovenox, Foot Pumps and TED hose 50% weightbearing right lower extremity  J. Cameron Proud, PA-C [place] 12/02/2019, 9:49 AM

## 2019-12-02 NOTE — Progress Notes (Signed)
PT Cancellation Note  Patient Details Name: Karen Lewis MRN: [id number] DOB: [birth year]   Cancelled Treatment:    Reason Eval/Treat Not Completed: Other (comment)   Pt heard yelling out frequently in room while I was on the hall this am.  Checked with RN who recommended holding session today.  Will continue as appropriate.   Chesley Noon 12/02/2019, 11:12 AM

## 2019-12-03 LAB — GLUCOSE, CAPILLARY
Glucose-Capillary: 187 mg/dL — ABNORMAL HIGH (ref 70–99)
Glucose-Capillary: 148 mg/dL — ABNORMAL HIGH (ref 70–99)
Glucose-Capillary: 144 mg/dL — ABNORMAL HIGH (ref 70–99)
Glucose-Capillary: 123 mg/dL — ABNORMAL HIGH (ref 70–99)
Glucose-Capillary: 145 mg/dL — ABNORMAL HIGH (ref 70–99)

## 2019-12-03 LAB — RESPIRATORY PANEL BY RT PCR (FLU A&B, COVID)
Influenza A by PCR: NEGATIVE
Influenza B by PCR: NEGATIVE
SARS Coronavirus 2 by RT PCR: NEGATIVE

## 2019-12-03 MED ORDER — NALOXONE HCL 0.4 MG/ML IJ SOLN
0.2000 mg | Freq: Once | INTRAMUSCULAR | Status: AC
  Administered 2019-12-03: 0.2 mg via INTRAVENOUS

## 2019-12-03 MED ORDER — GUAIFENESIN ER 600 MG PO TB12
600.0000 mg | ORAL_TABLET | Freq: Two times a day (BID) | ORAL | Status: DC
  Administered 2019-12-03 – 2019-12-05 (×5): 600 mg via ORAL
  Filled 2019-12-03 (×5): qty 1

## 2019-12-03 MED ORDER — SODIUM CHLORIDE 0.9 % IV BOLUS
500.0000 mL | Freq: Once | INTRAVENOUS | Status: AC
  Administered 2019-12-03: 500 mL via INTRAVENOUS

## 2019-12-03 MED ORDER — OXYCODONE HCL 5 MG PO TABS
15.0000 mg | ORAL_TABLET | ORAL | Status: DC | PRN
  Administered 2019-12-03 – 2019-12-05 (×9): 15 mg via ORAL
  Filled 2019-12-03 (×9): qty 3

## 2019-12-03 MED ORDER — NALOXONE HCL 0.4 MG/ML IJ SOLN
INTRAMUSCULAR | Status: AC
  Filled 2019-12-03: qty 1

## 2019-12-03 NOTE — Care Management Important Message (Signed)
Important Message  Patient Details  Name: Karen Lewis MRN: [id number] Date of Birth: 07/18/1971   Medicare Important Message Given:  Yes     Shelbie Ammons, RN 12/03/2019, 10:53 AM

## 2019-12-03 NOTE — Progress Notes (Signed)
    BRIEF OVERNIGHT PROGRESS REPORT   Subjective: I was notified by RN that rapid response was initiated due to patient unresponsiveness to noxious stimulation. Apparently she had received multiple medications overnight for chronic pain.  Objective: On arrival to the bedside, she was afebrile with low blood pressure 77/45 mm Hg and pulse rate 95 beats/min. Oxygen saturation of 99% on RA. There were no focal neurological deficits; she was responding minimally for me to noxious stimuli.  Assessment/Plan Narcan 0.2 was administered IV with immediate response noted. Patient opened her eye and stated " did this happen again? Did I just receive Narcan? I must have taken too much of my medication". She received a bolus of NS 500 cc with improvement in her BP to 98/55 mm Hg. She will remain in the ortho unit since she is stable and no transfer required. Attending physician notified by nursing staff.   Rufina Falco, DNP, CCRN, FNP-C [facility] Nurse Practitioner Between 7pm to 7am - Pager - [facility]  Office  [phone]

## 2019-12-03 NOTE — Progress Notes (Signed)
Was told in report that patient requested to be awaken when scheduled pain med is due.  Patient not responding appropriately when trying to awaken patient for the scheduled pain medication.  Patient mumbled words and would fall back to sleep snoring and would not open her eyes. V/S were stable except that blood pressure systolic [id number] diastolic [id number]. Requested for staff Nurses to look at patient as a witness to show how drowsy she was. Did not give pain medication for patient too drowsy. Paged Rapid Response Team and attending NP on call and was then advised to page Dr. Roland Rack. Orders for IV Narcan, 500cc bolus of 0.9% NaCl all given to patient. Patient slowly became more alert and slowly began opening her eyes. Sternum rub performed and awakened patient more. Once bolus finished, patient was a lot more alert and conversed with [place]. Patient was aware of her surroundings and immediately knew what was going. Dr. Roland Rack gave verbal orders to discontinue Oxycontin and modify Oxycontin IR. Also verbal orders given for pharmacy consult to space out scheduled pain meds if need. Will continue to monitor patient to end of shift.

## 2019-12-03 NOTE — Progress Notes (Signed)
OT Cancellation Note  Patient Details Name: Karen Lewis MRN: [id number] DOB: Aug 22, 1971   Cancelled Treatment:    Reason Eval/Treat Not Completed: Other (comment). Chart reviewed. Pt noted to have rapid response called last night. BP remains low this am. Will hold and continue to follow to re-attempt OT tx at later date/time as pt is more medically appropriate.   Jeni Salles, MPH, MS, OTR/L ascom [phone] 12/03/19, 10:19 AM

## 2019-12-03 NOTE — Progress Notes (Signed)
   12/03/19 0410  Clinical Encounter Type  Visited With Patient  Visit Type Follow-up  Referral From Nurse  Consult/Referral To Chaplain  Spiritual Encounters  Spiritual Needs Emotional  Stress Factors  Patient Stress Factors Family relationships;Health changes  CH received page to respond to rapid response. Pt was alert and awake upon arrival. Was talkative throughout visit. This Chief Strategy Officer provided pastoral care through active and reflective listening and being a non-judgmental presence. Pt is dealing with loss of control of health over last two years. Also struggles with relationships with close family. [place] built rapport with pt over several minutes. Pt will appreciate another pastoral visit later today.

## 2019-12-03 NOTE — Progress Notes (Signed)
Occupational Therapy Treatment Patient Details Name: Karen Lewis MRN: [id number] DOB: [birth year] Today's Date: 12/03/2019    History of present illness Per MD notes: Pt is a 49 y/o F with R hip OA s/p elective R THA.  PMH includes morbid obesity, HTN, DM, Depression, anxiety, asthma, fibromyalgia, L THA, R shoulder arthroplasty, bilat carpal tunnel release, GERD, cardiovascular disease, PCOS, and bilat knee OA.   OT comments  Pt seen for OT tx this date, focused on BUE there-ex to support improved ADL mobility and ADL independence. Pt instructed in BUE ther-ex with green theraband and written handout provided, pt able to return demo all exercises with min verbal cues for technique. Pt verbalized understanding of frequency and grading task. Pt continues to benefit from skilled OT services. Continue to recommend SNF at this time.   Follow Up Recommendations  SNF    Equipment Recommendations  3 in 1 bedside commode    Recommendations for Other Services      Precautions / Restrictions Precautions Precautions: Anterior Hip Precaution Booklet Issued: No Restrictions Weight Bearing Restrictions: Yes RLE Weight Bearing: Weight bearing as tolerated RLE Partial Weight Bearing Percentage or Pounds: 50%       Mobility Bed Mobility               General bed mobility comments: Pt declined all but supine therex this session secondary to increased R hip pain and fatigue  Transfers                      Balance                                           ADL either performed or assessed with clinical judgement   ADL Overall ADL's : Needs assistance/impaired                                       General ADL Comments: MOD I + Increased time doffs B socks long sitting in bed. SETUP bathing long sitting in bed - assist for back. SETUP grooming, self-feeding, and don night gown long sitting in bed.      Vision       Perception      Praxis      Cognition Arousal/Alertness: Awake/alert Behavior During Therapy: WFL for tasks assessed/performed Overall Cognitive Status: Within Functional Limits for tasks assessed                                          Exercises Total Joint Exercises Ankle Circles/Pumps: AROM;Strengthening;Both;10 reps;15 reps(Manual resistance) Quad Sets: Strengthening;Both;10 reps;15 reps Gluteal Sets: Strengthening;Both;10 reps;15 reps Towel Squeeze: Strengthening;Both;10 reps Short Arc Quad: Strengthening;Both;10 reps;15 reps(Manual resistance) Heel Slides: Both;Right;AAROM;10 reps;Strengthening(AAROM on the RLE) Hip ABduction/ADduction: AAROM;Both;AROM;15 reps(AAROM on the RLE) Straight Leg Raises: Strengthening;Both;10 reps;AAROM Other Exercises Other Exercises: LLE supine leg press with manual resistance x 10 Other Exercises: Pt instructed in BUE ther-ex with green theraband and written handout provided, pt able to return demo all exercises with min verbal cues for technique   Shoulder Instructions       General Comments      Pertinent Vitals/ Pain  Pain Assessment: No/denies pain Pain Score: 7  Pain Location: [name] Descriptors / Indicators: Sore;Aching Pain Intervention(s): Premedicated before session;Monitored during session  Home Living Family/patient expects to be discharged to:: Private residence                                        Prior Functioning/Environment              Frequency  Min 2X/week        Progress Toward Goals  OT Goals(current goals can now be found in the care plan section)  Progress towards OT goals: Progressing toward goals  Acute Rehab OT Goals Patient Stated Goal: To get better so I can go home after rehab" OT Goal Formulation: With patient Time For Goal Achievement: 12/12/19 Potential to Achieve Goals: Good  Plan Discharge plan remains appropriate;Frequency remains appropriate     Co-evaluation                 AM-PAC OT "6 Clicks" Daily Activity     Outcome Measure   Help from another person eating meals?: None Help from another person taking care of personal grooming?: None Help from another person toileting, which includes using toliet, bedpan, or urinal?: A Lot Help from another person bathing (including washing, rinsing, drying)?: A Little Help from another person to put on and taking off regular upper body clothing?: A Little Help from another person to put on and taking off regular lower body clothing?: A Lot 6 Click Score: 18    End of Session    OT Visit Diagnosis: Other abnormalities of gait and mobility (R26.89);Repeated falls (R29.6);Pain Pain - Right/Left: Right Pain - part of body: Hip;Knee;Leg   Activity Tolerance Patient tolerated treatment well   Patient Left in bed;with call bell/phone within reach;with bed alarm set   Nurse Communication          Time: [id number] OT Time Calculation (min): 17 min  Charges: OT General Charges $OT Visit: 1 Visit OT Treatments $Therapeutic Exercise: 8-22 mins  Jeni Salles, MPH, MS, OTR/L ascom [phone] 12/03/19, 4:58 PM

## 2019-12-03 NOTE — Progress Notes (Signed)
MEDICATION RELATED CONSULT NOTE - INITIAL   Pharmacy Consult for Med Review Indication: review for meds that cause drowsiness  Allergies  Allergen Reactions  . Bactrim [Sulfamethoxazole-Trimethoprim] Hives  . Omalizumab Itching and Hives  . Ciprofloxacin Other (See Comments)    myalgia   . Shellfish Allergy Other (See Comments)    unknown  . Sulfamethoxazole Hives  . Trimethoprim Hives  . Clindamycin Rash  . Motrin [Ibuprofen]     GI UPSET  . Nsaids Nausea Only and Other (See Comments)    Stomach upset  . Sulfa Antibiotics Hives   Patient Measurements: Height: 5\' 4"  (162.6 cm) Weight: (!) 380 lb 1.2 oz (172.4 kg) IBW/kg (Calculated) : 54.7  Vital Signs: Temp: 97.9 F (36.6 C) (03/21 2336) BP: 81/69 (03/22 0510) Pulse Rate: 91 (03/22 0510) Intake/Output from previous day: 03/21 0701 - 03/22 0700 In: 500 [IV Piggyback:500] Out: 1600 [Urine:1600] Intake/Output from this shift: No intake/output data recorded.  Labs: Recent Labs    12/01/19 0814 12/02/19 1022  WBC 11.1*  --   HGB 9.8*  --   HCT 30.6*  --   PLT 251  --   MG  --  1.8   Estimated Creatinine Clearance: 117.6 mL/min (by C-G formula based on SCr of 0.93 mg/dL).  Medical History: Past Medical History:  Diagnosis Date  . Anemia   . Anginal pain ([place])   . Anxiety   . Arthralgia of hip 07/29/2015  . Arthritis   . Arthritis, degenerative 07/29/2015  . Asthma   . Cephalalgia 07/25/2014  . Dependence on unknown drug ([place])    multiplt controlled drug dependence  . Depression   . Diabetes mellitus without complication ([place])   . Dysrhythmia   . Eczema   . Fibromyalgia   . Gastritis   . GERD (gastroesophageal reflux disease)   . Gonalgia 07/29/2015   Overview:  Overview:  The patient has had bilateral intra-articular Hyalgan injections done on 07/16/2014 and although she seems to do well with this type of therapy, apparently her insurance company does not want to pay for they Hyalgan. On 11/27/2014  the patient underwent a bilateral genicular nerve block with excellent results. On 01/28/2015 she had a right knee genicular radiofrequency ablatio  . Gout   . H/O cardiovascular disorder 03/10/2015  . H/O surgical procedure 12/05/2012   Overview:  LSG ([facility] - April 2013)    . H/O thyroid disease 03/10/2015  . Headache   . Herpes   . History of artificial joint 07/29/2015  . History of hiatal hernia   . Hypertension   . Hypomagnesemia   . Hypothyroidism   . IDA (iron deficiency anemia) 05/28/2019  . LBP (low back pain) 07/29/2015  . Neuromuscular disorder ([place])   . Obesity   . PCOS (polycystic ovarian syndrome)   . Primary osteoarthritis of both knees 07/29/2015  . Sleep apnea    not using a Cpap machine at this time  . Thyroid nodule bilateral  . Umbilical hernia    Medications:  Scheduled:  . acidophilus  1 capsule Oral Daily  . amitriptyline  100 mg Oral QHS  . vitamin C  1,000 mg Oral Daily  . calcium carbonate  1 tablet Oral TID WC  . Chlorhexidine Gluconate Cloth  6 each Topical Daily  . docusate sodium  100 mg Oral BID  . enoxaparin (LOVENOX) injection  40 mg Subcutaneous Q12H  . gentamicin ointment  1 application Topical Daily  . insulin aspart  0-20 Units  Subcutaneous TID WC  . levothyroxine  250 mcg Oral QAC breakfast  . lisinopril  5 mg Oral QHS  . magnesium oxide  800 mg Oral BID  . metFORMIN  1,000 mg Oral BID WC  . metoprolol tartrate  75 mg Oral BID  . montelukast  10 mg Oral QHS  . multivitamin with minerals  2 tablet Oral Daily  . naloxone      . pantoprazole  80 mg Oral Daily  . pregabalin  225 mg Oral BID  . QUEtiapine  100 mg Oral QHS  . rosuvastatin  40 mg Oral Daily  . sertraline  200 mg Oral Daily  . vitamin B-12  2,500 mcg Oral Daily  . Vitamin D (Ergocalciferol)  50,000 Units Oral Once per day on Mon Fri  . zonisamide  150 mg Oral QHS   Assessment: Pt with multiple medical issues on chronic narcotics for pain.  Goal of Therapy:  Avoid DDIs  and adverse effects  Discussion: Meds that can cause drowsiness and have an additive effect are amitriptyline, quetiapine, pregabalin, fioricet, clonazepam, benadryl, pregabalin, tizanidine, oxycodone and zolpidem. When given in combination they can increase drowsiness and cause a "space out" effect.  Recommend to narrow therapy and reduce medications as much as clinically possible.  Hart Robinsons A 12/03/2019,7:02 AM

## 2019-12-03 NOTE — Progress Notes (Signed)
Physical Therapy Treatment Patient Details Name: Karen Lewis MRN: [id number] DOB: 1971-08-15 Today's Date: 12/03/2019    History of Present Illness Per MD notes: Pt is a 49 y/o F with R hip OA s/p elective R THA.  PMH includes morbid obesity, HTN, DM, Depression, anxiety, asthma, fibromyalgia, L THA, R shoulder arthroplasty, bilat carpal tunnel release, GERD, cardiovascular disease, PCOS, and bilat knee OA.    PT Comments    Pt pleasant and motivated to participate during the session.  Pt made notable progress towards goals this session.  Pt required no physical assistance with bed mobility tasks and used a gait belt wrapped around her R foot to assist her RLE back into bed.  Pt was able to both stand and laterally scoot several times this session from an elevated EOB and did not require physical assist to do so.  Pt demonstrated fair eccentric and concentric control and stability during transfers.  Pt motivated to take steps this session with extensive time taken for training on PWB sequencing prior to attempt.  Pt was able to take several small steps forwards/backwards near the EOB this session with fair control and stability but with further distances limited over concerns for PWB compliance.  Pt will benefit from PT services in a SNF setting upon discharge to safely address deficits listed in patient problem list for decreased caregiver assistance and eventual return to PLOF.     Follow Up Recommendations  SNF     Equipment Recommendations  3in1 (PT);Other (comment)(Bariatric BSC)    Recommendations for Other Services       Precautions / Restrictions Precautions Precautions: Anterior Hip Precaution Booklet Issued: No Restrictions Weight Bearing Restrictions: Yes RLE Weight Bearing: Weight bearing as tolerated RLE Partial Weight Bearing Percentage or Pounds: 50%    Mobility  Bed Mobility Overal bed mobility: Needs Assistance Bed Mobility: Supine to Sit;Sit to Supine      Supine to sit: Supervision Sit to supine: Supervision   General bed mobility comments: Pt utilized gait belt around R foot to assist RLE back into bed with all bed mobility tasks requiring extra time and effort but no physical assistance this session  Transfers Overall transfer level: Needs assistance Equipment used: Rolling walker (2 wheeled) Transfers: Sit to/from Stand;Lateral/Scoot Transfers Sit to Stand: Min guard;From elevated surface        Lateral/Scoot Transfers: Supervision General transfer comment: Pt able to perform multiple sit to/from stand transfers and lateral scoot transfers with min verbal cues for sequencing but did not require physical assist  Ambulation/Gait Ambulation/Gait assistance: Min guard Gait Distance (Feet): 2 Feet x 2 Assistive device: Rolling walker (2 wheeled) Gait Pattern/deviations: Step-to pattern;Trunk flexed Gait velocity: decreased   General Gait Details: Max verbal and visual cues for proper sequencing with amb for PWB compliance   Stairs             Wheelchair Mobility    Modified Rankin (Stroke Patients Only)       Balance Overall balance assessment: Needs assistance Sitting-balance support: No upper extremity supported Sitting balance-Leahy Scale: Normal Sitting balance - Comments: able to maintain upright sitting posture without UE support   Standing balance support: Bilateral upper extremity supported Standing balance-Leahy Scale: Fair Standing balance comment: Mod lean on the RW for support in standing                            Cognition Arousal/Alertness: Awake/alert Behavior During Therapy: [facility] for  tasks assessed/performed Overall Cognitive Status: Within Functional Limits for tasks assessed                                        Exercises Total Joint Exercises Ankle Circles/Pumps: AROM;Strengthening;Both;10 reps;5 reps(with manual resistance) Quad Sets: Strengthening;Both;10  reps;15 reps Gluteal Sets: Strengthening;Both;10 reps Hip ABduction/ADduction: AAROM;Both;AROM;15 reps(AAROM on the RLE) Straight Leg Raises: Strengthening;Both;10 reps;AAROM Long Arc Quad: AROM;Both;Strengthening;10 reps;15 reps Knee Flexion: AROM;Both;10 reps;Strengthening;15 reps Marching in Standing: AROM;Right;10 reps;Standing Other Exercises Other Exercises: Partial sit to stands from EOB x 5 Other Exercises: Dynamic sitting at EOB with reaching activities Other Exercises: visual education and demonstration on proper sequencing with forward/backward steps for WB compliance    General Comments        Pertinent Vitals/Pain Pain Assessment: 0-10 Pain Score: 6  Pain Location: [name] Descriptors / Indicators: Sore;Aching Pain Intervention(s): Premedicated before session;Monitored during session    Home Living                      Prior Function            PT Goals (current goals can now be found in the care plan section) Progress towards PT goals: Progressing toward goals    Frequency    BID      PT Plan Current plan remains appropriate    Co-evaluation              AM-PAC PT "6 Clicks" Mobility   Outcome Measure  Help needed turning from your back to your side while in a flat bed without using bedrails?: A Little Help needed moving from lying on your back to sitting on the side of a flat bed without using bedrails?: A Little Help needed moving to and from a bed to a chair (including a wheelchair)?: A Lot Help needed standing up from a chair using your arms (e.g., wheelchair or bedside chair)?: A Lot Help needed to walk in hospital room?: Total Help needed climbing 3-5 steps with a railing? : Total 6 Click Score: 12    End of Session Equipment Utilized During Treatment: Gait belt Activity Tolerance: Patient tolerated treatment well Patient left: in bed;with call bell/phone within reach;with bed alarm set Nurse Communication: Mobility  status PT Visit Diagnosis: Other abnormalities of gait and mobility (R26.89);Muscle weakness (generalized) (M62.81);Pain Pain - Right/Left: Right Pain - part of body: Hip;Leg     Time: [id number] PT Time Calculation (min) (ACUTE ONLY): 41 min  Charges:  $Gait Training: 8-22 mins $Therapeutic Exercise: 23-37 mins                     D. Scott Dantrell Schertzer PT, DPT 12/03/19, 12:26 PM

## 2019-12-03 NOTE — Progress Notes (Signed)
Subjective: 6 Days Post-Op Procedure(s) (LRB): TOTAL HIP ARTHROPLASTY ANTERIOR APPROACH (Right) Patient reports pain as mild No complaints this morning.  Denies any dizziness, lightheadedness, chest pain shortness of breath or abdominal pain.  Did have a large bowel movement yesterday. Rapid response called last night due to patient being unresponsive after narcotic medications.  Narcan given, patient with immediate response.  She has been alert since, vital signs are stable, blood pressure slightly low.  Orders have been placed to discontinue scheduled narcotics. We will continue with therapy today.  Patient requesting to go home with home health PT  Objective: Vital signs in last 24 hours: Temp:  [97.9 F (36.6 C)] 97.9 F (36.6 C) (03/21 2336) Pulse Rate:  [62-95] 91 (03/22 0510) Resp:  [18-20] 20 (03/22 0411) BP: (76-115)/(42-79) 81/69 (03/22 0510) SpO2:  [94 %-100 %] 98 % (03/22 0446)  Intake/Output from previous day: 03/21 0701 - 03/22 0700 In: 500 [IV Piggyback:500] Out: 1600 [Urine:1600] Intake/Output this shift: No intake/output data recorded.  Recent Labs    12/01/19 0814  HGB 9.8*   Recent Labs    12/01/19 0814  WBC 11.1*  RBC 3.69*  HCT 30.6*  PLT 251   No results for input(s): NA, K, CL, CO2, BUN, CREATININE, GLUCOSE, CALCIUM in the last 72 hours. No results for input(s): LABPT, INR in the last 72 hours.  EXAM General - Patient is Alert, Appropriate and Oriented Extremity - Neurovascular intact Sensation intact distally Intact pulses distally Dressing - dressing C/D/I and no drainage, Praveena intact without drainage.  Motor Function - intact, moving foot and toes well on exam.   Past Medical History:  Diagnosis Date  . Anemia   . Anginal pain ([place])   . Anxiety   . Arthralgia of hip 07/29/2015  . Arthritis   . Arthritis, degenerative 07/29/2015  . Asthma   . Cephalalgia 07/25/2014  . Dependence on unknown drug ([place])    multiplt controlled  drug dependence  . Depression   . Diabetes mellitus without complication ([place])   . Dysrhythmia   . Eczema   . Fibromyalgia   . Gastritis   . GERD (gastroesophageal reflux disease)   . Gonalgia 07/29/2015   Overview:  Overview:  The patient has had bilateral intra-articular Hyalgan injections done on 07/16/2014 and although she seems to do well with this type of therapy, apparently her insurance company does not want to pay for they Hyalgan. On 11/27/2014 the patient underwent a bilateral genicular nerve block with excellent results. On 01/28/2015 she had a right knee genicular radiofrequency ablatio  . Gout   . H/O cardiovascular disorder 03/10/2015  . H/O surgical procedure 12/05/2012   Overview:  LSG ([facility] - April 2013)    . H/O thyroid disease 03/10/2015  . Headache   . Herpes   . History of artificial joint 07/29/2015  . History of hiatal hernia   . Hypertension   . Hypomagnesemia   . Hypothyroidism   . IDA (iron deficiency anemia) 05/28/2019  . LBP (low back pain) 07/29/2015  . Neuromuscular disorder ([place])   . Obesity   . PCOS (polycystic ovarian syndrome)   . Primary osteoarthritis of both knees 07/29/2015  . Sleep apnea    not using a Cpap machine at this time  . Thyroid nodule bilateral  . Umbilical hernia     Assessment/Plan:   6 Days Post-Op Procedure(s) (LRB): TOTAL HIP ARTHROPLASTY ANTERIOR APPROACH (Right) Active Problems:   Status post total hip replacement, right  Estimated body mass index is 65.24 kg/m as calculated from the following:   Height as of this encounter: 5\' 4"  (1.626 m).   Weight as of this encounter: 172.4 kg. Advance diet Up with therapy, 50% weightbearing right lower extremity Patient doing well.  No complaints. Blood pressure low, discontinued scheduled narcotics.  Limit as needed narcotics until blood pressure improved.  Hold lisinopril and metoprolol. Encourage incentive spirometer Care management to assist with discharge to SNF  today   DVT Prophylaxis - Lovenox, Foot Pumps and TED hose 50% weightbearing right lower extremity   T. Rachelle Hora, PA-C [place] 12/03/2019, 8:03 AM

## 2019-12-03 NOTE — TOC Progression Note (Signed)
Transition of Care [facility] - Progression Note    Patient Details  Name: Karen Lewis MRN: [id number] Date of Birth: 13-Jun-1971  Transition of [facility]) CM/SW Contact  Su Hilt, RN Phone Number: 12/03/2019, 12:42 PM  Clinical Narrative:    Spoke with the patient and reviewed the bed offers, she chose [facility] I notified [facility] at [facility], I called [facility] to get authorization for the patient to go to SNF, they do not manage the patient but it will be regular [employer], I notified Otila Kluver    Expected Discharge Plan: [place] Barriers to Discharge: Continued Medical Work up  Expected Discharge Plan and Services Expected Discharge Plan: [place] In-house Referral: Clinical Social Work     Living arrangements for the past 2 months: Single Family Home                                       Social Determinants of Health (SDOH) Interventions    Readmission Risk Interventions No flowsheet data found.

## 2019-12-03 NOTE — Progress Notes (Signed)
Arrived to patient room, patient obtunded but arousable by deep sternal rub. B/P 76/59 and 77/45 oxygen saturation 91-94%. NS bolus of 518ml given, Narcan 0.2mg  given IV. Patient immediately alert/oriented.  BP improved see [facility] for further assessment. Patient to remain on current unit at this time.

## 2019-12-03 NOTE — Progress Notes (Signed)
Physical Therapy Treatment Patient Details Name: Karen Lewis MRN: [id number] DOB: 1971-01-08 Today's Date: 12/03/2019    History of Present Illness Per MD notes: Pt is a 49 y/o F with R hip OA s/p elective R THA.  PMH includes morbid obesity, HTN, DM, Depression, anxiety, asthma, fibromyalgia, L THA, R shoulder arthroplasty, bilat carpal tunnel release, GERD, cardiovascular disease, PCOS, and bilat knee OA.    PT Comments    Pt pleasant and motivated to participate with therex during the session but declined getting out of bed secondary to increased R hip pain and fatigue.  Pt tolerated below therex well with R hip pain increasing to 7-8/10 from 5/10 during the AM session.  Pt given frequent short therapeutic rest breaks for pain control during the session.  Pt will benefit from PT services in a SNF setting upon discharge to safely address deficits listed in patient problem list for decreased caregiver assistance and eventual return to PLOF.     Follow Up Recommendations  SNF     Equipment Recommendations  3in1 (PT);Other (comment)    Recommendations for Other Services       Precautions / Restrictions Precautions Precautions: Anterior Hip Restrictions Weight Bearing Restrictions: Yes RLE Weight Bearing: Weight bearing as tolerated RLE Partial Weight Bearing Percentage or Pounds: 50%    Mobility  Bed Mobility               General bed mobility comments: Pt declined all but supine therex this session secondary to increased R hip pain and fatigue  Transfers                    Ambulation/Gait                 Stairs             Wheelchair Mobility    Modified Rankin (Stroke Patients Only)       Balance                                            Cognition Arousal/Alertness: Awake/alert Behavior During Therapy: WFL for tasks assessed/performed Overall Cognitive Status: Within Functional Limits for tasks assessed                                        Exercises Total Joint Exercises Ankle Circles/Pumps: AROM;Strengthening;Both;10 reps;15 reps(Manual resistance) Quad Sets: Strengthening;Both;10 reps;15 reps Gluteal Sets: Strengthening;Both;10 reps;15 reps Towel Squeeze: Strengthening;Both;10 reps Short Arc Quad: Strengthening;Both;10 reps;15 reps(Manual resistance) Heel Slides: Both;Right;AAROM;10 reps;Strengthening(AAROM on the RLE) Hip ABduction/ADduction: AAROM;Both;AROM;15 reps(AAROM on the RLE) Straight Leg Raises: Strengthening;Both;10 reps;AAROM Other Exercises Other Exercises: LLE supine leg press with manual resistance x 10    General Comments        Pertinent Vitals/Pain Pain Assessment: 0-10 Pain Score: 7  Pain Location: [name] Descriptors / Indicators: Sore;Aching Pain Intervention(s): Premedicated before session;Monitored during session    Home Living Family/patient expects to be discharged to:: Private residence                    Prior Function            PT Goals (current goals can now be found in the care plan section) Progress towards PT goals: Progressing toward goals  Frequency    BID      PT Plan Current plan remains appropriate    Co-evaluation              AM-PAC PT "6 Clicks" Mobility   Outcome Measure  Help needed turning from your back to your side while in a flat bed without using bedrails?: A Little Help needed moving from lying on your back to sitting on the side of a flat bed without using bedrails?: A Little Help needed moving to and from a bed to a chair (including a wheelchair)?: A Lot Help needed standing up from a chair using your arms (e.g., wheelchair or bedside chair)?: A Lot Help needed to walk in hospital room?: Total Help needed climbing 3-5 steps with a railing? : Total 6 Click Score: 12    End of Session   Activity Tolerance: Patient tolerated treatment well Patient left: in bed;with call  bell/phone within reach;with bed alarm set Nurse Communication: Mobility status PT Visit Diagnosis: Other abnormalities of gait and mobility (R26.89);Muscle weakness (generalized) (M62.81);Pain Pain - Right/Left: Right Pain - part of body: Hip;Leg     Time: [id number] PT Time Calculation (min) (ACUTE ONLY): 24 min  Charges:  $Therapeutic Exercise: 23-37 mins                     D. Royetta Asal PT, DPT 12/03/19, 4:48 PM

## 2019-12-04 ENCOUNTER — Encounter: Payer: Self-pay | Admitting: Pain Medicine

## 2019-12-04 ENCOUNTER — Telehealth: Payer: Self-pay | Admitting: *Deleted

## 2019-12-04 LAB — BASIC METABOLIC PANEL
Anion gap: 11 (ref 5–15)
BUN: 42 mg/dL — ABNORMAL HIGH (ref 6–20)
CO2: 22 mmol/L (ref 22–32)
Calcium: 7.6 mg/dL — ABNORMAL LOW (ref 8.9–10.3)
Chloride: 100 mmol/L (ref 98–111)
Creatinine, Ser: 1.77 mg/dL — ABNORMAL HIGH (ref 0.44–1.00)
GFR calc Af Amer: 38 mL/min — ABNORMAL LOW (ref >60)
GFR calc non Af Amer: 33 mL/min — ABNORMAL LOW (ref >60)
Glucose, Bld: 133 mg/dL — ABNORMAL HIGH (ref 70–99)
Potassium: 4.9 mmol/L (ref 3.5–5.1)
Sodium: 133 mmol/L — ABNORMAL LOW (ref 135–145)

## 2019-12-04 LAB — CREATININE, SERUM
Creatinine, Ser: 2.62 mg/dL — ABNORMAL HIGH (ref 0.44–1.00)
GFR calc Af Amer: 24 mL/min — ABNORMAL LOW (ref >60)
GFR calc non Af Amer: 21 mL/min — ABNORMAL LOW (ref >60)

## 2019-12-04 LAB — GLUCOSE, CAPILLARY
Glucose-Capillary: 130 mg/dL — ABNORMAL HIGH (ref 70–99)
Glucose-Capillary: 110 mg/dL — ABNORMAL HIGH (ref 70–99)
Glucose-Capillary: 177 mg/dL — ABNORMAL HIGH (ref 70–99)
Glucose-Capillary: 131 mg/dL — ABNORMAL HIGH (ref 70–99)

## 2019-12-04 MED ORDER — SODIUM CHLORIDE 0.9 % IV BOLUS
1000.0000 mL | Freq: Once | INTRAVENOUS | Status: AC
  Administered 2019-12-04: 1000 mL via INTRAVENOUS

## 2019-12-04 NOTE — TOC Progression Note (Signed)
Transition of Care [facility]) - Progression Note    Patient Details  Name: Karen Lewis MRN: [id number] Date of Birth: 16-Sep-1970  Transition of Care [facility]) CM/SW [place], RN Phone Number: 12/04/2019, 3:32 PM  Clinical Narrative:    I contacted Tina at [facility] to inquire about the [facility] approval, she sent another message to [facility] and this time  Sent to administration, will notify me once obtained   Expected Discharge Plan: [place] Barriers to Discharge: Continued Medical Work up  Expected Discharge Plan and Services Expected Discharge Plan: [place] In-house Referral: Clinical Social Work     Living arrangements for the past 2 months: Single Family Home                                       Social Determinants of Health (SDOH) Interventions    Readmission Risk Interventions Readmission Risk Prevention Plan 12/04/2019  Transportation Screening Complete  PCP or Specialist Appt within 3-5 Days Complete  HRI or [place] Complete  Palliative Care Screening Not Applicable  Medication Review (RN Care Manager) Referral to Pharmacy  Some recent data might be hidden

## 2019-12-04 NOTE — Progress Notes (Signed)
Patient: Karen Lewis  Service Category: E/M  Provider: Gaspar Cola, MD  DOB: Jan 24, 1971  DOS: 12/05/2019  Location: Office  MRN: [id number]  Setting: Ambulatory outpatient  Referring Provider: Idelle Crouch, MD  Type: Established Patient  Specialty: Interventional Pain Management  PCP: Idelle Crouch, MD  Location: Remote location  Delivery: TeleHealth     Virtual Encounter - Pain Management PROVIDER NOTE: Information contained herein reflects review and annotations entered in association with encounter. Interpretation of such information and data should be left to medically-trained personnel. Information provided to patient can be located elsewhere in the medical record under "Patient Instructions". Document created using STT-dictation technology, any transcriptional errors that may result from process are unintentional.    Contact & Pharmacy Preferred: [phone] Home: [phone] (home) Mobile: [phone] (mobile) E-mail: [email or web address]  [place], [place] - [address] [place] [address] Phone: [phone] Fax: [phone]   Pre-screening  Karen Lewis offered "in-person" vs "virtual" encounter. She indicated preferring virtual for this encounter.   Reason COVID-19*  Social distancing based on CDC and AMA recommendations.   I contacted Karen Lewis on 12/05/2019 via telephone.      I clearly identified myself as Gaspar Cola, MD. I verified that I was speaking with the correct person using two identifiers (Name: Karen Lewis, and date of birth: 10-01-1970).  Consent I sought verbal advanced consent from Karen Lewis for virtual visit interactions. I informed Karen Lewis of possible security and privacy concerns, risks, and limitations associated with providing "not-in-person" medical evaluation and management services. I also informed Karen Lewis of the availability of "in-person" appointments. Finally, I informed  her that there would be a charge for the virtual visit and that she could be  personally, fully or partially, financially responsible for it. Karen Lewis expressed understanding and agreed to proceed.   Historic Elements   Karen Lewis is a 49 y.o. year old, female patient evaluated today after her last contact with our practice on 12/04/2019. Karen Lewis  has a past medical history of Anemia, Anginal pain ([place]), Anxiety, Arthralgia of hip (07/29/2015), Arthritis, Arthritis, degenerative (07/29/2015), Asthma, Cephalalgia (07/25/2014), Dependence on unknown drug ([place]), Depression, Diabetes mellitus without complication ([place]), Dysrhythmia, Eczema, Fibromyalgia, Gastritis, GERD (gastroesophageal reflux disease), Gonalgia (07/29/2015), Gout, H/O cardiovascular disorder (03/10/2015), H/O surgical procedure (12/05/2012), H/O thyroid disease (03/10/2015), Headache, Herpes, History of artificial joint (07/29/2015), History of hiatal hernia, Hypertension, Hypomagnesemia, Hypothyroidism, IDA (iron deficiency anemia) (05/28/2019), LBP (low back pain) (07/29/2015), Neuromuscular disorder ([place]), Obesity, PCOS (polycystic ovarian syndrome), Primary osteoarthritis of both knees (07/29/2015), Sleep apnea, Thyroid nodule (bilateral), and Umbilical hernia. She also  has a past surgical history that includes Laparoscopic partial gastrectomy; Shoulder arthroscopy (Right); Joint replacement (Left, hip); Carpal tunnel release (Bilateral); Diagnostic laparoscopy; Cholecystectomy; Trigger finger release (Right); Thyroidectomy (N/A, 11/12/2015); left trigger finger; Roux-en-Y Gastric Bypass (06/03/2017); Hiatal hernia repair; peniculectomy (N/A, 07/05/2018); Appendectomy; and Total hip arthroplasty (Right, 11/27/2019). Karen Lewis has a current medication list which includes the following prescription(s): accu-chek aviva plus, acetaminophen, acidophilus, albuterol, amitriptyline, vitamin c, biotin, butalbital-acetaminophen-caffeine, calcium carbonate,  clonazepam, b-12, diclofenac sodium, diphenhydramine, enoxaparin, epinephrine, ergocalciferol, famotidine, gentamicin ointment, accu-chek aviva plus, levocetirizine, lisinopril, magnesium oxide, meclizine, medroxyprogesterone, metformin, metoprolol tartrate, montelukast, multi-vitamin, naloxone, naphazoline-pheniramine, naproxen sodium, omeprazole, [START ON 12/06/2019] oxycodone, [START ON 01/05/2020] oxycodone, [START ON 02/04/2020] oxycodone, pregabalin, quetiapine, rosuvastatin, sertraline, spironolactone, b complex-c, tizanidine, tizanidine, triamcinolone, zolpidem, zonisamide, and levothyroxine, and the following Facility-Administered Medications:  sodium chloride, acetaminophen, acidophilus, alum & mag hydroxide-simeth, amitriptyline, ascorbic acid, bisacodyl, butalbital-acetaminophen-caffeine, calcium carbonate, chlorhexidine gluconate cloth, clonazepam, diphenhydramine, docusate sodium, enoxaparin, famotidine, gentamicin ointment, guaifenesin, insulin aspart, ipratropium-albuterol, levothyroxine (SYNTHROID) tablet 250 mcg, magnesium citrate, magnesium hydroxide, magnesium oxide, meclizine, menthol-cetylpyridinium **OR** phenol, metoclopramide **OR** metoclopramide, metoprolol tartrate (LOPRESSOR) tablet 75 mg, montelukast, multivitamin with minerals, naloxone, naphazoline-pheniramine, ondansetron **OR** ondansetron, oxycodone, pantoprazole, pregabalin, quetiapine, rosuvastatin, sertraline, tizanidine, tramadol, triamcinolone, vitamin b-12, vitamin d (ergocalciferol), zolpidem, and zonisamide. She  reports that she has never smoked. She has never used smokeless tobacco. She reports that she does not drink alcohol or use drugs. Karen Lewis is allergic to bactrim [sulfamethoxazole-trimethoprim]; omalizumab; ciprofloxacin; shellfish allergy; clindamycin; and nsaids.   HPI  Today, she is being contacted for medication management.  Review of the records show that the patient obtained oxycodone 5 mg from Dorise Hiss, PA-C on 11/30/2019, as well as oxycodone IR 15 mg from Dr. Milagros Evener.  In addition there are some tramadol pills prescribed by Fulton Reek on 10/05/2019.  The patient recently had her right total hip replacement with Dr. Hessie Knows on 11/27/2019.  She is still recovering from this and she is pending to go to rehab and therapy to assist her with the recovery period.  The patient indicates doing well with the current medication regimen. No adverse reactions or side effects reported to the medications.  She indicates that as soon as she recovers, she is planning on contacting us to resume treatment of her knees.  Pharmacotherapy Assessment  Analgesic: Oxycodone IR 5 mg 1 tab PO 5X/day (#150/mo) (25 mg/day) MME/day:37.5 mg/day.   Monitoring: [place] PMP: PDMP reviewed during this encounter.       Pharmacotherapy: No side-effects or adverse reactions reported. Compliance: No problems identified. Effectiveness: Clinically acceptable. Plan: Refer to "POC".  UDS:  Summary  Date Value Ref Range Status  08/23/2018 FINAL  Final    Comment:    ==================================================================== TOXASSURE SELECT 13 (MW) ==================================================================== Test                             Result       Flag       Units Drug Present and Declared for Prescription Verification   Oxycodone                      260          EXPECTED   ng/mg creat   Oxymorphone                    52           EXPECTED   ng/mg creat   Noroxycodone                   991          EXPECTED   ng/mg creat    Sources of oxycodone include scheduled prescription medications.    Oxymorphone and noroxycodone are expected metabolites of    oxycodone. Oxymorphone is also available as a scheduled    prescription medication. Drug Absent but Declared for Prescription Verification   Codeine                        Not Detected UNEXPECTED ng/mg creat   Butalbital  Not Detected UNEXPECTED ==================================================================== Test                      Result    Flag   Units      Ref Range   Creatinine              203              mg/dL      >=20 ==================================================================== Declared Medications:  The flagging and interpretation on this report are based on the  following declared medications.  Unexpected results may arise from  inaccuracies in the declared medications.  **Note: The testing scope of this panel includes these medications:  Butalbital (Butalbital/APAP/Caffeine)  Codeine  Oxycodone  **Note: The testing scope of this panel does not include following  reported medications:  Acetaminophen  Acetaminophen (Butalbital/APAP/Caffeine)  Acyclovir  Albuterol  Albuterol (Ipratropium-Albuterol)  Amitriptyline  Caffeine (Butalbital/APAP/Caffeine)  Clotrimazole  Cyanocobalamin  Diphenhydramine  Epinephrine  Estradiol  Famotidine  Fluconazole  Gabapentin  Guaifenesin  Ipratropium (Ipratropium-Albuterol)  Iron (Ferrous Sulfate)  Ketoconazole  Ketorolac  Levocetirizine  Levofloxacin  Levothyroxine  Magnesium  Magnesium Oxide  Meclizine  Medroxyprogesterone (Depo-Provera)  Metformin  Metoprolol  Montelukast  Naloxone  Nitrofurantoin (Macrobid)  Omeprazole  Ondansetron  Phentermine  Pioglitazone  Quetiapine  Ranitidine  Rosuvastatin  Semaglutide (Ozempic)  Sertraline  Spironolactone  Tizanidine  Triamcinolone  Vitamin B (Biotin)  Vitamin C  Vitamin D2 (Ergocalciferol)  Zolpidem  Zonisamide (Zonegran) ==================================================================== For clinical consultation, please call [phone]. ====================================================================    Laboratory Chemistry Profile   Renal Lab Results  Component Value Date   BUN 26 (H) 12/05/2019   CREATININE 0.90 12/05/2019   GFRAA >60 12/05/2019    GFRNONAA >60 12/05/2019    Hepatic Lab Results  Component Value Date   AST 36 11/22/2019   ALT 27 11/22/2019   ALBUMIN 3.9 11/22/2019   ALKPHOS 70 11/22/2019    Electrolytes Lab Results  Component Value Date   NA 138 12/05/2019   K 4.6 12/05/2019   CL 107 12/05/2019   CALCIUM 7.9 (L) 12/05/2019   MG 1.8 12/02/2019   PHOS 5.5 (H) 05/22/2019    Bone Lab Results  Component Value Date   VD25OH 27.4 (L) 11/24/2015   VD125OH2TOT 41.5 11/24/2015    Inflammation (CRP: Acute Phase) (ESR: Chronic Phase) Lab Results  Component Value Date   CRP 0.7 09/27/2019   ESRSEDRATE 30 (H) 09/27/2019      Note: Above Lab results reviewed.  Imaging  DG Chest 2 View CLINICAL DATA:  Fever  EXAM: CHEST - 2 VIEW  COMPARISON:  05/29/2019  FINDINGS: Prominence of the cardiac silhouette is likely accentuated by portable technique and slightly decreased lung volumes. There is pulmonary vascular congestion without frank interstitial edema. No focal airspace consolidation. No pleural effusion or pneumothorax.  IMPRESSION: Pulmonary vascular congestion. No focal airspace consolidation.  Electronically Signed   By: Davina Poke D.O.   On: 11/30/2019 13:17  Assessment  The primary encounter diagnosis was Chronic pain syndrome. Diagnoses of Chronic knee pain (Primary Area of Pain) (Bilateral) (R>L), Chronic hip pain (Secondary area of Pain) (Right), Chronic low back pain (Third area of Pain) (Bilateral) (R>L), S/P THR (total hip replacement) (Left), and History of total hip replacement, bilateral were also pertinent to this visit.  Plan of Care  Problem-specific:  No problem-specific Assessment & Plan notes found for this encounter.  Karen Lewis has a current medication list which  includes the following long-term medication(s): albuterol, clonazepam, diphenhydramine, enoxaparin, lisinopril, magnesium oxide, metformin, metoprolol tartrate, montelukast, naloxone, [START ON 12/06/2019]  oxycodone, [START ON 01/05/2020] oxycodone, [START ON 02/04/2020] oxycodone, pregabalin, spironolactone, triamcinolone, zonisamide, and levothyroxine.  Pharmacotherapy (Medications Ordered): Meds ordered this encounter  Medications  . oxyCODONE (OXY IR/ROXICODONE) 5 MG immediate release tablet    Sig: Take 1 tablet (5 mg total) by mouth 5 (five) times daily. Must last 30 days    Dispense:  150 tablet    Refill:  0    Chronic Pain: STOP Act (Not applicable) Fill 1 day early if closed on refill date. Do not fill until: 12/06/2019. To last until: 01/05/2020. Avoid benzodiazepines within 8 hours of opioids  . oxyCODONE (OXY IR/ROXICODONE) 5 MG immediate release tablet    Sig: Take 1 tablet (5 mg total) by mouth 5 (five) times daily. Must last 30 days    Dispense:  150 tablet    Refill:  0    Chronic Pain: STOP Act (Not applicable) Fill 1 day early if closed on refill date. Do not fill until: 01/05/2020. To last until: 02/04/2020. Avoid benzodiazepines within 8 hours of opioids  . oxyCODONE (OXY IR/ROXICODONE) 5 MG immediate release tablet    Sig: Take 1 tablet (5 mg total) by mouth 5 (five) times daily. Must last 30 days    Dispense:  150 tablet    Refill:  0    Chronic Pain: STOP Act (Not applicable) Fill 1 day early if closed on refill date. Do not fill until: 02/04/2020. To last until: 03/05/2020. Avoid benzodiazepines within 8 hours of opioids   Orders:  No orders of the defined types were placed in this encounter.  Follow-up plan:   Return in about 13 weeks (around 03/05/2020) for (VV), (MM).      Considering:  NOTE: NO RFA until BMI less or equal to 35. No interventional therapies until she fully heals from her Mycobacterium infection   Palliative PRN treatment(s):  Palliative bilateral IA Hyalgan knee injections  Palliative bilateral genicular NB  Palliative bilateral lumbar facet blocks     Recent Visits No visits were found meeting these conditions.  Showing recent visits within  past 90 days and meeting all other requirements   Today's Visits Date Type Provider Dept  12/05/19 Telemedicine Milinda Pointer, MD [facility]  Showing today's visits and meeting all other requirements   Future Appointments No visits were found meeting these conditions.  Showing future appointments within next 90 days and meeting all other requirements   I discussed the assessment and treatment plan with the patient. The patient was provided an opportunity to ask questions and all were answered. The patient agreed with the plan and demonstrated an understanding of the instructions.  Patient advised to call back or seek an in-person evaluation if the symptoms or condition worsens.  Duration of encounter: 12 minutes.  Note by: Gaspar Cola, MD Date: 12/05/2019; Time: 10:30 AM

## 2019-12-04 NOTE — Progress Notes (Signed)
Physical Therapy Treatment Patient Details Name: Karen Lewis MRN: [id number] DOB: 1971/01/25 Today's Date: 12/04/2019    History of Present Illness Per MD notes: Pt is a 49 y/o F with R hip OA s/p elective R THA.  PMH includes morbid obesity, HTN, DM, Depression, anxiety, asthma, fibromyalgia, L THA, R shoulder arthroplasty, bilat carpal tunnel release, GERD, cardiovascular disease, PCOS, and bilat knee OA.    PT Comments    Pt pleasant and motivated to participate during the session but was limited significantly by increased R hip pain this session, nursing aware.  Pt participate with some supine therex and rolling but declined further activity secondary to increased R hip pain.  Pt will benefit from PT services in a SNF setting upon discharge to safely address deficits listed in patient problem list for decreased caregiver assistance and eventual return to PLOF.     Follow Up Recommendations  SNF     Equipment Recommendations  3in1 (PT);Other (comment)(Bariatric BSC)    Recommendations for Other Services       Precautions / Restrictions Precautions Precautions: Anterior Hip Precaution Booklet Issued: No Restrictions Weight Bearing Restrictions: Yes RLE Weight Bearing: Weight bearing as tolerated RLE Partial Weight Bearing Percentage or Pounds: 50%    Mobility  Bed Mobility Overal bed mobility: Modified Independent Bed Mobility: Rolling Rolling: Modified independent (Device/Increase time)   Supine to sit: Supervision Sit to supine: Min assist   General bed mobility comments: Increased effort and use of bed rails with rolling but no physical assistance required  Transfers Overall transfer level: Needs assistance Equipment used: Rolling walker (2 wheeled) Transfers: Sit to/from Stand;Lateral/Scoot Transfers Sit to Stand: Min guard;From elevated surface        Lateral/Scoot Transfers: Supervision General transfer comment: Pt declined this session secondary to  increased R hip pain with movement  Ambulation/Gait Ambulation/Gait assistance: Min guard Gait Distance (Feet): 1 Feet Assistive device: Rolling walker (2 wheeled) Gait Pattern/deviations: Step-to pattern;Trunk flexed Gait velocity: decreased   General Gait Details: Pt able to take 2-3 very small steps at the EOB with mod verbal and tactile cues for sequencing for PWB compliance   Stairs             Wheelchair Mobility    Modified Rankin (Stroke Patients Only)       Balance Overall balance assessment: Needs assistance Sitting-balance support: No upper extremity supported Sitting balance-Leahy Scale: Normal Sitting balance - Comments: able to maintain upright sitting posture without UE support   Standing balance support: Bilateral upper extremity supported Standing balance-Leahy Scale: Fair Standing balance comment: Mod lean on the RW for support in standing                            Cognition Arousal/Alertness: Awake/alert Behavior During Therapy: WFL for tasks assessed/performed Overall Cognitive Status: Within Functional Limits for tasks assessed                                        Exercises Total Joint Exercises Ankle Circles/Pumps: AROM;Strengthening;Both;10 reps;15 reps(manual resistance) Quad Sets: Strengthening;Both;10 reps;15 reps Gluteal Sets: Strengthening;Both;10 reps;15 reps Heel Slides: AROM;Both;5 reps(small amplitude RLE) Long Arc Quad: AROM;Both;Strengthening;10 reps;15 reps Knee Flexion: AROM;Both;10 reps;Strengthening;15 reps Marching in Standing: AROM;Right;Standing;5 reps Other Exercises Other Exercises: HEP education and review    General Comments        Pertinent Vitals/Pain  Pain Assessment: 0-10 Pain Score: 7  Faces Pain Scale: Hurts even more Pain Location: [name] Descriptors / Indicators: Sore;Aching Pain Intervention(s): Premedicated before session;Monitored during session    Home  Living                      Prior Function            PT Goals (current goals can now be found in the care plan section) Progress towards PT goals: Not progressing toward goals - comment(limited by pain)    Frequency    BID      PT Plan Current plan remains appropriate    Co-evaluation              AM-PAC PT "6 Clicks" Mobility   Outcome Measure  Help needed turning from your back to your side while in a flat bed without using bedrails?: A Little Help needed moving from lying on your back to sitting on the side of a flat bed without using bedrails?: A Little Help needed moving to and from a bed to a chair (including a wheelchair)?: A Lot Help needed standing up from a chair using your arms (e.g., wheelchair or bedside chair)?: A Lot Help needed to walk in hospital room?: Total Help needed climbing 3-5 steps with a railing? : Total 6 Click Score: 12    End of Session Equipment Utilized During Treatment: Gait belt Activity Tolerance: Patient limited by fatigue Patient left: in bed;with call bell/phone within reach;with bed alarm set Nurse Communication: Mobility status PT Visit Diagnosis: Other abnormalities of gait and mobility (R26.89);Muscle weakness (generalized) (M62.81);Pain Pain - Right/Left: Right Pain - part of body: Hip;Leg     Time: 1325-1340 PT Time Calculation (min) (ACUTE ONLY): 15 min  Charges:  $Therapeutic Exercise: 8-22 mins $Therapeutic Activity: 8-22 mins                     D. Scott Luan Urbani PT, DPT 12/04/19, 1:59 PM

## 2019-12-04 NOTE — Progress Notes (Signed)
Physical Therapy Treatment Patient Details Name: Karen Lewis MRN: [id number] DOB: Aug 18, 1971 Today's Date: 12/04/2019    History of Present Illness Per MD notes: Pt is a 49 y/o F with R hip OA s/p elective R THA.  PMH includes morbid obesity, HTN, DM, Depression, anxiety, asthma, fibromyalgia, L THA, R shoulder arthroplasty, bilat carpal tunnel release, GERD, cardiovascular disease, PCOS, and bilat knee OA.    PT Comments    Pt pleasant and motivated to participate during the session.  Pt reported slightly increased R hip pain compared to yesterday but continued to put forth very good effort during the session.  Pt required min A for RLE support during sit to sup but was very close to being able to get her RLE into bed independently.  Pt was able to take several steps at the EOB with cues for sequencing for PWB compliance but at one point her R foot began to slide laterally due to wide BOS with pt able to self-correct and return safely to sitting at the EOB.  Pt will benefit from PT services in a SNF setting upon discharge to safely address deficits listed in patient problem list for decreased caregiver assistance and eventual return to PLOF.    Follow Up Recommendations  SNF     Equipment Recommendations  3in1 (PT);Other (comment)(Bariatric BSC)    Recommendations for Other Services       Precautions / Restrictions Precautions Precautions: Anterior Hip Precaution Booklet Issued: No Restrictions Weight Bearing Restrictions: Yes RLE Weight Bearing: Weight bearing as tolerated RLE Partial Weight Bearing Percentage or Pounds: 50%    Mobility  Bed Mobility Overal bed mobility: Needs Assistance Bed Mobility: Supine to Sit;Sit to Supine     Supine to sit: Supervision Sit to supine: Min assist   General bed mobility comments: Min A for RLE back into bed this session  Transfers Overall transfer level: Needs assistance Equipment used: Rolling walker (2 wheeled) Transfers: Sit  to/from Stand;Lateral/Scoot Transfers Sit to Stand: Min guard;From elevated surface        Lateral/Scoot Transfers: Supervision General transfer comment: Pt able to perform multiple sit to/from stand transfers and lateral scoot transfers with min verbal cues for sequencing but did not require physical assist  Ambulation/Gait Ambulation/Gait assistance: Min guard Gait Distance (Feet): 1 Feet Assistive device: Rolling walker (2 wheeled) Gait Pattern/deviations: Step-to pattern;Trunk flexed Gait velocity: decreased   General Gait Details: Pt able to take 2-3 very small steps at the EOB with mod verbal and tactile cues for sequencing for PWB compliance   Stairs             Wheelchair Mobility    Modified Rankin (Stroke Patients Only)       Balance Overall balance assessment: Needs assistance Sitting-balance support: No upper extremity supported Sitting balance-Leahy Scale: Normal Sitting balance - Comments: able to maintain upright sitting posture without UE support   Standing balance support: Bilateral upper extremity supported Standing balance-Leahy Scale: Fair Standing balance comment: Mod lean on the RW for support in standing                            Cognition Arousal/Alertness: Awake/alert Behavior During Therapy: WFL for tasks assessed/performed Overall Cognitive Status: Within Functional Limits for tasks assessed  Exercises Total Joint Exercises Ankle Circles/Pumps: AROM;Strengthening;Both;10 reps;15 reps Quad Sets: Strengthening;Both;10 reps;15 reps Gluteal Sets: Strengthening;Both;10 reps;15 reps Long Arc Quad: AROM;Both;Strengthening;10 reps;15 reps Knee Flexion: AROM;Both;10 reps;Strengthening;15 reps Marching in Standing: AROM;Right;Standing;5 reps  Multiple sit to/from stand and lateral scoot transfers for BLE strengthening    General Comments        Pertinent Vitals/Pain  Pain Assessment: 0-10 Pain Score: 6  Faces Pain Scale: Hurts even more Pain Location: [name] Descriptors / Indicators: Sore;Aching Pain Intervention(s): Premedicated before session;Monitored during session    Home Living                      Prior Function            PT Goals (current goals can now be found in the care plan section) Progress towards PT goals: Progressing toward goals    Frequency    BID      PT Plan Current plan remains appropriate    Co-evaluation              AM-PAC PT "6 Clicks" Mobility   Outcome Measure  Help needed turning from your back to your side while in a flat bed without using bedrails?: A Little Help needed moving from lying on your back to sitting on the side of a flat bed without using bedrails?: A Little Help needed moving to and from a bed to a chair (including a wheelchair)?: A Lot Help needed standing up from a chair using your arms (e.g., wheelchair or bedside chair)?: A Lot Help needed to walk in hospital room?: Total Help needed climbing 3-5 steps with a railing? : Total 6 Click Score: 12    End of Session Equipment Utilized During Treatment: Gait belt Activity Tolerance: Patient tolerated treatment well Patient left: in bed;with call bell/phone within reach;with bed alarm set Nurse Communication: Mobility status PT Visit Diagnosis: Other abnormalities of gait and mobility (R26.89);Muscle weakness (generalized) (M62.81);Pain Pain - Right/Left: Right Pain - part of body: Hip;Leg     Time: [id number] PT Time Calculation (min) (ACUTE ONLY): 24 min  Charges:  $Therapeutic Exercise: 8-22 mins $Therapeutic Activity: 8-22 mins                     D. Scott Sonyia Muro PT, DPT 12/04/19, 10:49 AM

## 2019-12-04 NOTE — Progress Notes (Signed)
Subjective: 7 Days Post-Op Procedure(s) (LRB): TOTAL HIP ARTHROPLASTY ANTERIOR APPROACH (Right) Patient reports pain as mild No complaints this morning.  Denies any dizziness, lightheadedness, chest pain shortness of breath or abdominal pain. We will continue with therapy today.  Plan is to go to SNF, awaiting insurance authorization  Objective: Vital signs in last 24 hours: Temp:  [98 F (36.7 C)-98.5 F (36.9 C)] 98.5 F (36.9 C) (03/23 0736) Pulse Rate:  [91-108] 91 (03/23 0736) Resp:  [18-20] 20 (03/23 0736) BP: (78-121)/(53-99) 112/58 (03/23 0736) SpO2:  [97 %-99 %] 99 % (03/23 0736)  Intake/Output from previous day: No intake/output data recorded. Intake/Output this shift: No intake/output data recorded.  No results for input(s): HGB in the last 72 hours. No results for input(s): WBC, RBC, HCT, PLT in the last 72 hours. Recent Labs    12/04/19 0505  CREATININE 2.62*   No results for input(s): LABPT, INR in the last 72 hours.  EXAM General - Patient is Alert, Appropriate and Oriented Extremity - Neurovascular intact Sensation intact distally Intact pulses distally Dressing - dressing C/D/I and no drainage, Praveena intact without drainage.  Motor Function - intact, moving foot and toes well on exam.   Past Medical History:  Diagnosis Date  . Anemia   . Anginal pain ([place])   . Anxiety   . Arthralgia of hip 07/29/2015  . Arthritis   . Arthritis, degenerative 07/29/2015  . Asthma   . Cephalalgia 07/25/2014  . Dependence on unknown drug ([place])    multiplt controlled drug dependence  . Depression   . Diabetes mellitus without complication ([place])   . Dysrhythmia   . Eczema   . Fibromyalgia   . Gastritis   . GERD (gastroesophageal reflux disease)   . Gonalgia 07/29/2015   Overview:  Overview:  The patient has had bilateral intra-articular Hyalgan injections done on 07/16/2014 and although she seems to do well with this type of therapy, apparently her  insurance company does not want to pay for they Hyalgan. On 11/27/2014 the patient underwent a bilateral genicular nerve block with excellent results. On 01/28/2015 she had a right knee genicular radiofrequency ablatio  . Gout   . H/O cardiovascular disorder 03/10/2015  . H/O surgical procedure 12/05/2012   Overview:  LSG ([facility] - April 2013)    . H/O thyroid disease 03/10/2015  . Headache   . Herpes   . History of artificial joint 07/29/2015  . History of hiatal hernia   . Hypertension   . Hypomagnesemia   . Hypothyroidism   . IDA (iron deficiency anemia) 05/28/2019  . LBP (low back pain) 07/29/2015  . Neuromuscular disorder ([place])   . Obesity   . PCOS (polycystic ovarian syndrome)   . Primary osteoarthritis of both knees 07/29/2015  . Sleep apnea    not using a Cpap machine at this time  . Thyroid nodule bilateral  . Umbilical hernia     Assessment/Plan:   7 Days Post-Op Procedure(s) (LRB): TOTAL HIP ARTHROPLASTY ANTERIOR APPROACH (Right) Active Problems:   Status post total hip replacement, right  Estimated body mass index is 65.24 kg/m as calculated from the following:   Height as of this encounter: 5\' 4"  (1.626 m).   Weight as of this encounter: 172.4 kg. Advance diet Up with therapy, 50% weightbearing right lower extremity Patient doing well.  No complaints. Pain well controlled Acute renal insufficiency -patient with low blood pressure yesterday, likely prerenal.  Will hold BP meds and give fluids.  Recheck creatinine after IV fluids. Encourage incentive spirometer Care management to assist with discharge to SNF today   DVT Prophylaxis - Lovenox, Foot Pumps and TED hose 50% weightbearing right lower extremity   T. Rachelle Hora, PA-C [place] 12/04/2019, 9:45 AM

## 2019-12-04 NOTE — TOC Progression Note (Signed)
Transition of Care [facility]) - Progression Note    Patient Details  Name: Karen Lewis MRN: [id number] Date of Birth: 08-29-1971  Transition of Care [facility]) CM/SW [place], RN Phone Number: 12/04/2019, 9:18 AM  Clinical Narrative:    RNCM reached out to [facility] with [facility] to inquire about if Insurance auth was received yet, she stated that they have not gotten approval yet, she will let me know once received   Expected Discharge Plan: [place] Barriers to Discharge: Continued Medical Work up  Expected Discharge Plan and Services Expected Discharge Plan: [place] In-house Referral: Clinical Social Work     Living arrangements for the past 2 months: Single Family Home                                       Social Determinants of Health (SDOH) Interventions    Readmission Risk Interventions No flowsheet data found.

## 2019-12-04 NOTE — Telephone Encounter (Signed)
Attempted to call for pre appointment review of allergies/meds. In PT at the moment, will call again later.

## 2019-12-05 ENCOUNTER — Inpatient Hospital Stay (HOSPITAL_BASED_OUTPATIENT_CLINIC_OR_DEPARTMENT_OTHER): Payer: Medicare HMO | Admitting: Pain Medicine

## 2019-12-05 DIAGNOSIS — Z96642 Presence of left artificial hip joint: Secondary | ICD-10-CM

## 2019-12-05 DIAGNOSIS — G8929 Other chronic pain: Secondary | ICD-10-CM

## 2019-12-05 DIAGNOSIS — M25551 Pain in right hip: Secondary | ICD-10-CM

## 2019-12-05 DIAGNOSIS — F19159 Other psychoactive substance abuse with psychoactive substance-induced psychotic disorder, unspecified: Secondary | ICD-10-CM | POA: Diagnosis not present

## 2019-12-05 DIAGNOSIS — W19XXXA Unspecified fall, initial encounter: Secondary | ICD-10-CM | POA: Diagnosis not present

## 2019-12-05 DIAGNOSIS — M25561 Pain in right knee: Secondary | ICD-10-CM

## 2019-12-05 DIAGNOSIS — M25562 Pain in left knee: Secondary | ICD-10-CM | POA: Diagnosis not present

## 2019-12-05 DIAGNOSIS — M2550 Pain in unspecified joint: Secondary | ICD-10-CM | POA: Diagnosis not present

## 2019-12-05 DIAGNOSIS — F3289 Other specified depressive episodes: Secondary | ICD-10-CM | POA: Diagnosis not present

## 2019-12-05 DIAGNOSIS — R52 Pain, unspecified: Secondary | ICD-10-CM | POA: Diagnosis not present

## 2019-12-05 DIAGNOSIS — G894 Chronic pain syndrome: Secondary | ICD-10-CM | POA: Diagnosis not present

## 2019-12-05 DIAGNOSIS — E119 Type 2 diabetes mellitus without complications: Secondary | ICD-10-CM | POA: Diagnosis not present

## 2019-12-05 DIAGNOSIS — Z96643 Presence of artificial hip joint, bilateral: Secondary | ICD-10-CM | POA: Diagnosis not present

## 2019-12-05 DIAGNOSIS — M545 Low back pain, unspecified: Secondary | ICD-10-CM

## 2019-12-05 DIAGNOSIS — Z7401 Bed confinement status: Secondary | ICD-10-CM | POA: Diagnosis not present

## 2019-12-05 DIAGNOSIS — F419 Anxiety disorder, unspecified: Secondary | ICD-10-CM | POA: Diagnosis not present

## 2019-12-05 DIAGNOSIS — E785 Hyperlipidemia, unspecified: Secondary | ICD-10-CM | POA: Diagnosis not present

## 2019-12-05 DIAGNOSIS — J45909 Unspecified asthma, uncomplicated: Secondary | ICD-10-CM | POA: Diagnosis not present

## 2019-12-05 DIAGNOSIS — M6281 Muscle weakness (generalized): Secondary | ICD-10-CM | POA: Diagnosis not present

## 2019-12-05 DIAGNOSIS — Z471 Aftercare following joint replacement surgery: Secondary | ICD-10-CM | POA: Diagnosis not present

## 2019-12-05 DIAGNOSIS — E569 Vitamin deficiency, unspecified: Secondary | ICD-10-CM | POA: Diagnosis not present

## 2019-12-05 LAB — CBC
HCT: 27.8 % — ABNORMAL LOW (ref 36.0–46.0)
Hemoglobin: 9.1 g/dL — ABNORMAL LOW (ref 12.0–15.0)
MCH: 26.9 pg (ref 26.0–34.0)
MCHC: 32.7 g/dL (ref 30.0–36.0)
MCV: 82.2 fL (ref 80.0–100.0)
Platelets: 349 10*3/uL (ref 150–400)
RBC: 3.38 MIL/uL — ABNORMAL LOW (ref 3.87–5.11)
RDW: 16.3 % — ABNORMAL HIGH (ref 11.5–15.5)
WBC: 9.3 10*3/uL (ref 4.0–10.5)
nRBC: 0 % (ref 0.0–0.2)

## 2019-12-05 LAB — BASIC METABOLIC PANEL
Anion gap: 7 (ref 5–15)
BUN: 26 mg/dL — ABNORMAL HIGH (ref 6–20)
CO2: 24 mmol/L (ref 22–32)
Calcium: 7.9 mg/dL — ABNORMAL LOW (ref 8.9–10.3)
Chloride: 107 mmol/L (ref 98–111)
Creatinine, Ser: 0.9 mg/dL (ref 0.44–1.00)
GFR calc Af Amer: >60 mL/min (ref >60)
GFR calc non Af Amer: >60 mL/min (ref >60)
Glucose, Bld: 164 mg/dL — ABNORMAL HIGH (ref 70–99)
Potassium: 4.6 mmol/L (ref 3.5–5.1)
Sodium: 138 mmol/L (ref 135–145)

## 2019-12-05 LAB — GLUCOSE, CAPILLARY
Glucose-Capillary: 158 mg/dL — ABNORMAL HIGH (ref 70–99)
Glucose-Capillary: 131 mg/dL — ABNORMAL HIGH (ref 70–99)

## 2019-12-05 MED ORDER — OXYCODONE HCL 5 MG PO TABS: 5.0000 mg | ORAL_TABLET | Freq: Every day | ORAL | 0 refills | Status: DC

## 2019-12-05 NOTE — Progress Notes (Signed)
Subjective: 8 Days Post-Op Procedure(s) (LRB): TOTAL HIP ARTHROPLASTY ANTERIOR APPROACH (Right) Patient reports pain as mild No complaints this morning.  Denies any dizziness, lightheadedness, chest pain shortness of breath or abdominal pain. We will continue with therapy today.  Plan is to go to SNF, awaiting insurance authorization  Objective: Vital signs in last 24 hours: Temp:  [98.1 F (36.7 C)-99.8 F (37.7 C)] 99.2 F (37.3 C) (03/24 0453) Pulse Rate:  [91-105] 105 (03/24 0453) Resp:  [18-20] 18 (03/23 1536) BP: (97-148)/(58-75) 107/64 (03/24 0453) SpO2:  [94 %-100 %] 95 % (03/24 0453)  Intake/Output from previous day: 03/23 0701 - 03/24 0700 In: 480 [P.O.:480] Out: 0  Intake/Output this shift: No intake/output data recorded.  Recent Labs    12/05/19 0445  HGB 9.1*   Recent Labs    12/05/19 0445  WBC 9.3  RBC 3.38*  HCT 27.8*  PLT 349   Recent Labs    12/04/19 1353 12/05/19 0445  NA 133* 138  K 4.9 4.6  CL 100 107  CO2 22 24  BUN 42* 26*  CREATININE 1.77* 0.90  GLUCOSE 133* 164*  CALCIUM 7.6* 7.9*   No results for input(s): LABPT, INR in the last 72 hours.  EXAM General - Patient is Alert, Appropriate and Oriented Extremity - Neurovascular intact Sensation intact distally Intact pulses distally Dressing - dressing C/D/I and no drainage, Praveena intact without drainage.  Motor Function - intact, moving foot and toes well on exam.   Past Medical History:  Diagnosis Date  . Anemia   . Anginal pain ([place])   . Anxiety   . Arthralgia of hip 07/29/2015  . Arthritis   . Arthritis, degenerative 07/29/2015  . Asthma   . Cephalalgia 07/25/2014  . Dependence on unknown drug ([place])    multiplt controlled drug dependence  . Depression   . Diabetes mellitus without complication ([place])   . Dysrhythmia   . Eczema   . Fibromyalgia   . Gastritis   . GERD (gastroesophageal reflux disease)   . Gonalgia 07/29/2015   Overview:  Overview:  The patient  has had bilateral intra-articular Hyalgan injections done on 07/16/2014 and although she seems to do well with this type of therapy, apparently her insurance company does not want to pay for they Hyalgan. On 11/27/2014 the patient underwent a bilateral genicular nerve block with excellent results. On 01/28/2015 she had a right knee genicular radiofrequency ablatio  . Gout   . H/O cardiovascular disorder 03/10/2015  . H/O surgical procedure 12/05/2012   Overview:  LSG ([facility] - April 2013)    . H/O thyroid disease 03/10/2015  . Headache   . Herpes   . History of artificial joint 07/29/2015  . History of hiatal hernia   . Hypertension   . Hypomagnesemia   . Hypothyroidism   . IDA (iron deficiency anemia) 05/28/2019  . LBP (low back pain) 07/29/2015  . Neuromuscular disorder ([place])   . Obesity   . PCOS (polycystic ovarian syndrome)   . Primary osteoarthritis of both knees 07/29/2015  . Sleep apnea    not using a Cpap machine at this time  . Thyroid nodule bilateral  . Umbilical hernia     Assessment/Plan:   8 Days Post-Op Procedure(s) (LRB): TOTAL HIP ARTHROPLASTY ANTERIOR APPROACH (Right) Active Problems:   Status post total hip replacement, right  Estimated body mass index is 65.24 kg/m as calculated from the following:   Height as of this encounter: 5\' 4"  (1.626 m).  Weight as of this encounter: 172.4 kg. Advance diet Up with therapy, 50% weightbearing right lower extremity Patient doing well.  No complaints. Pain well controlled Acute renal insufficiency- resolved Encourage incentive spirometer Care management to assist with discharge to SNF today   DVT Prophylaxis - Lovenox, Foot Pumps and TED hose 50% weightbearing right lower extremity   T. Rachelle Hora, PA-C [place] 12/05/2019, 7:22 AM

## 2019-12-05 NOTE — Progress Notes (Signed)
Physical Therapy Treatment Patient Details Name: Karen Lewis MRN: [id number] DOB: 1970-12-01 Today's Date: 12/05/2019    History of Present Illness Per MD notes: Pt is a 49 y/o F with R hip OA s/p elective R THA.  PMH includes morbid obesity, HTN, DM, Depression, anxiety, asthma, fibromyalgia, L THA, R shoulder arthroplasty, bilat carpal tunnel release, GERD, cardiovascular disease, PCOS, and bilat knee OA.    PT Comments    Pt pleasant and motivated to participate during the session but remained somewhat limited by R hip pain during the session.  Pt put forth very good effort with below therex but presented with significant pain during bed mobility tasks.  Pt was able to stand x 2 at the EOB and perform RLE heel raises but was unable to advance the RLE during amb attempts.  Max standing tolerance 30-45 sec before requiring to return to sitting secondary to R hip pain. Pt will benefit from PT services in a SNF setting upon discharge to safely address deficits listed in patient problem list for decreased caregiver assistance and eventual return to PLOF.     Follow Up Recommendations  SNF     Equipment Recommendations  3in1 (PT);Other (comment)(bariatric BSC)    Recommendations for Other Services       Precautions / Restrictions Precautions Precautions: Anterior Hip Precaution Booklet Issued: No Restrictions Weight Bearing Restrictions: Yes RLE Weight Bearing: Weight bearing as tolerated RLE Partial Weight Bearing Percentage or Pounds: 50%    Mobility  Bed Mobility Overal bed mobility: Modified Independent Bed Mobility: Rolling Rolling: Modified independent (Device/Increase time)   Supine to sit: Modified independent (Device/Increase time)     General bed mobility comments: Increased effort and use of bed rails with rolling and sup to sit but no physical assistance required  Transfers Overall transfer level: Needs assistance Equipment used: Rolling walker (2 wheeled)    Sit to Stand: Min guard;From elevated surface         General transfer comment: Increased time/effort to stand from elevated EOB but no physical assistance needed  Ambulation/Gait             General Gait Details: Pt unable to advance RLE this session despite multiple attempts   Stairs             Wheelchair Mobility    Modified Rankin (Stroke Patients Only)       Balance Overall balance assessment: Needs assistance Sitting-balance support: No upper extremity supported Sitting balance-Leahy Scale: Normal Sitting balance - Comments: able to maintain upright sitting posture without UE support   Standing balance support: Bilateral upper extremity supported Standing balance-Leahy Scale: Fair Standing balance comment: Mod lean on the RW for support in standing                            Cognition Arousal/Alertness: Awake/alert Behavior During Therapy: WFL for tasks assessed/performed Overall Cognitive Status: Within Functional Limits for tasks assessed                                        Exercises Total Joint Exercises Ankle Circles/Pumps: AROM;Strengthening;Both;10 reps;15 reps(manual resistance) Quad Sets: Strengthening;Both;10 reps;15 reps Gluteal Sets: Strengthening;Both;10 reps;15 reps Towel Squeeze: Strengthening;Both;10 reps Heel Slides: AROM;Both;10 reps;AAROM(AAROM to the RLE, manual resistance to the LLE) Hip ABduction/ADduction: AAROM;Both;AROM;15 reps(AAROM RLE) Straight Leg Raises: Strengthening;Both;10 reps(Isometric on the RLE) Long  Arc Quad: AROM;Both;Strengthening;10 reps;15 reps Knee Flexion: AROM;Both;10 reps;Strengthening;15 reps Other Exercises Other Exercises: RLE heel raises x 10    General Comments        Pertinent Vitals/Pain Pain Assessment: 0-10 Pain Score: 7  Pain Location: [name] Descriptors / Indicators: Sore;Aching;Grimacing Pain Intervention(s): Premedicated before  session;Monitored during session;RN gave pain meds during session    Home Living                      Prior Function            PT Goals (current goals can now be found in the care plan section) Progress towards PT goals: Progressing toward goals    Frequency    BID      PT Plan Current plan remains appropriate    Co-evaluation              AM-PAC PT "6 Clicks" Mobility   Outcome Measure  Help needed turning from your back to your side while in a flat bed without using bedrails?: A Little Help needed moving from lying on your back to sitting on the side of a flat bed without using bedrails?: A Little Help needed moving to and from a bed to a chair (including a wheelchair)?: A Lot Help needed standing up from a chair using your arms (e.g., wheelchair or bedside chair)?: A Little Help needed to walk in hospital room?: Total Help needed climbing 3-5 steps with a railing? : Total 6 Click Score: 13    End of Session Equipment Utilized During Treatment: Gait belt Activity Tolerance: Patient limited by pain Patient left: in bed;with nursing/sitter in room(Pt left sitting at EOB with CNA for bathing) Nurse Communication: Mobility status PT Visit Diagnosis: Other abnormalities of gait and mobility (R26.89);Muscle weakness (generalized) (M62.81);Pain Pain - Right/Left: Right Pain - part of body: Hip;Leg     Time: 0929(and 9:55-10:05 with break for toileting)-0945 PT Time Calculation (min) (ACUTE ONLY): 16 min  Charges:  $Therapeutic Exercise: 8-22 mins $Therapeutic Activity: 8-22 mins                     D. Scott Dontrail Blackwell PT, DPT 12/05/19, 10:22 AM

## 2019-12-05 NOTE — TOC Transition Note (Signed)
Transition of Care [facility]) - CM/SW Discharge Note   Patient Details  Name: Karen Lewis MRN: [id number] Date of Birth: 1970/10/30  [facility]) CM/SW Contact:  Su Hilt, RN Phone Number: 12/05/2019, 1:40 PM   Clinical Narrative:     Patient to discharge to [facility] today, The DC packet is on the chart and the bedside nurse to call report, RNCM called EMS to transport and the patient will notify he fiance [name] of the DC,   Final next level of care: [place] Barriers to Discharge: Barriers Resolved   Patient Goals and CMS Choice Patient states their goals for this hospitalization and ongoing recovery are:: I plan to go home and have my fiance help me, like he has in the past      Discharge Placement              Patient chooses bed at: [facility] [place] Patient to be transferred to facility by: ems Name of family member notified: [name] Patient and family notified of of transfer: 12/05/19  Discharge Plan and Services In-house Referral: Clinical Social Work                                   Social Determinants of Health (SDOH) Interventions     Readmission Risk Interventions Readmission Risk Prevention Plan 12/04/2019  Transportation Screening Complete  PCP or Specialist Appt within 3-5 Days Complete  HRI or Home Care Consult Complete  Palliative Care Screening Not Applicable  Medication Review (RN Care Manager) Referral to Pharmacy  Some recent data might be hidden

## 2019-12-05 NOTE — TOC Progression Note (Signed)
Transition of Care [facility]) - Progression Note    Patient Details  Name: Karen Lewis MRN: [id number] Date of Birth: 1971-03-25  Transition of Care [facility]) CM/SW [place], RN Phone Number: 12/05/2019, 11:41 AM  Clinical Narrative:    Received notification from Otila Kluver at [facility] that Auth was approved, she will go to room 605, Notified physician that auth was approved   Expected Discharge Plan: [place] Barriers to Discharge: Continued Medical Work up  Expected Discharge Plan and Services Expected Discharge Plan: [place] In-house Referral: Clinical Social Work     Living arrangements for the past 2 months: Single Family Home                                       Social Determinants of Health (SDOH) Interventions    Readmission Risk Interventions Readmission Risk Prevention Plan 12/04/2019  Transportation Screening Complete  PCP or Specialist Appt within 3-5 Days Complete  HRI or [place] Complete  Palliative Care Screening Not Applicable  Medication Review (RN Care Manager) Referral to Pharmacy  Some recent data might be hidden

## 2019-12-08 LAB — ACID FAST CULTURE WITH REFLEXED SENSITIVITIES (MYCOBACTERIA): Acid Fast Culture: NEGATIVE

## 2019-12-10 ENCOUNTER — Other Ambulatory Visit: Payer: Self-pay

## 2019-12-10 ENCOUNTER — Telehealth: Payer: Self-pay | Admitting: *Deleted

## 2019-12-10 NOTE — Patient Outreach (Signed)
[facility]) Care Management  12/10/2019  PHYILLIS KIRST Sep 28, 1970 [id number]     Transition of Care Referral  Referral Date: 12/10/2019 Referral Source: [facility] Discharge Report Date of Discharge: 12/07/2019 Facility: [facility] Insurance: [facility]   Referral received. Transition of care calls being completed via EMMI-automated calls. RN CM will outreach patient for any red flags received.      Plan: RN CM will close case at this time.    Enzo Montgomery, RN,BSN,CCM [place] Management Telephonic Care Management Coordinator Direct Phone: [phone] Toll Free: [phone] Fax: [phone]

## 2019-12-10 NOTE — Telephone Encounter (Signed)
Patient called asking if her doctor appointment Friday could be changed to virtual since she recently had hip surgery and it is hard for her to maneuver around. Please advise and contact patient with answer

## 2019-12-10 NOTE — Patient Outreach (Signed)
[facility]) Care Management  12/10/2019  Karen Lewis 1971-08-09 [id number]    EMMI-general Discharge RED ON EMMI ALERT Day #1 Date: 12/09/2019 Red Alert Reason: " Got discharge papers?No"   Outreach attempt # 1 to patient. Spoke with patient who denies any acute issues or concerns at present. Reviewed and addressed red alert with patient. She voices that she has her paperwork 'somewhere." she confirms that she has reviewed it and understands it. She voices that she was scheduled to get [facility] services. She has not heard from agency yet. She is aware to expect a call within 48-72hrs and if not to contact agency. She reports that she will be calling PCP office today to make an appt. She denies any issues with transportation. She confirms that she has all her meds and no issues or concerns regarding them. She denies any further RN CM needs or concerns at this time. She is aware that she will receive one more automated EMMI-General discharge call within the next few days.      Plan: RN CM will close case at this time.  Enzo Montgomery, RN,BSN,CCM [facility] Telephonic Care Management Coordinator Direct Phone: [phone] Toll Free: [phone] Fax: [phone]

## 2019-12-10 NOTE — Telephone Encounter (Signed)
Karen Lewis please change MD visit on 4/2 to Virtual. I talked to pt and she is aware that it will need to be in the [place]. Ok to change to another day if no availability on Friday afternoon. Please contact pt with new time. Keep lab appointment on 3/30. Thanks.

## 2019-12-11 ENCOUNTER — Other Ambulatory Visit
Admission: RE | Admit: 2019-12-11 | Discharge: 2019-12-11 | Disposition: A | Discharge status: Home / Self Care | Payer: Medicare HMO | Source: Ambulatory Visit | Attending: Physician Assistant | Admitting: Physician Assistant

## 2019-12-11 ENCOUNTER — Other Ambulatory Visit
Admission: RE | Admit: 2019-12-11 | Discharge: 2019-12-11 | Disposition: A | Discharge status: Home / Self Care | Payer: Medicare HMO | Source: Ambulatory Visit | Attending: Oncology | Admitting: Oncology

## 2019-12-11 ENCOUNTER — Inpatient Hospital Stay: Payer: Medicare HMO

## 2019-12-11 DIAGNOSIS — R509 Fever, unspecified: Secondary | ICD-10-CM | POA: Insufficient documentation

## 2019-12-11 DIAGNOSIS — R790 Abnormal level of blood mineral: Secondary | ICD-10-CM | POA: Diagnosis not present

## 2019-12-11 DIAGNOSIS — E1159 Type 2 diabetes mellitus with other circulatory complications: Secondary | ICD-10-CM | POA: Diagnosis not present

## 2019-12-11 DIAGNOSIS — I10 Essential (primary) hypertension: Secondary | ICD-10-CM | POA: Insufficient documentation

## 2019-12-11 DIAGNOSIS — Z09 Encounter for follow-up examination after completed treatment for conditions other than malignant neoplasm: Secondary | ICD-10-CM | POA: Diagnosis not present

## 2019-12-11 DIAGNOSIS — D509 Iron deficiency anemia, unspecified: Secondary | ICD-10-CM | POA: Insufficient documentation

## 2019-12-11 DIAGNOSIS — L0231 Cutaneous abscess of buttock: Secondary | ICD-10-CM | POA: Diagnosis not present

## 2019-12-11 LAB — COMPREHENSIVE METABOLIC PANEL
ALT: 23 U/L (ref 0–44)
AST: 27 U/L (ref 15–41)
Albumin: 3.7 g/dL (ref 3.5–5.0)
Alkaline Phosphatase: 99 U/L (ref 38–126)
Anion gap: 12 (ref 5–15)
BUN: 12 mg/dL (ref 6–20)
CO2: 24 mmol/L (ref 22–32)
Calcium: 8.6 mg/dL — ABNORMAL LOW (ref 8.9–10.3)
Chloride: 101 mmol/L (ref 98–111)
Creatinine, Ser: 1.07 mg/dL — ABNORMAL HIGH (ref 0.44–1.00)
GFR calc Af Amer: >60 mL/min (ref >60)
GFR calc non Af Amer: >60 mL/min (ref >60)
Glucose, Bld: 112 mg/dL — ABNORMAL HIGH (ref 70–99)
Potassium: 4.3 mmol/L (ref 3.5–5.1)
Sodium: 137 mmol/L (ref 135–145)
Total Bilirubin: 0.4 mg/dL (ref 0.3–1.2)
Total Protein: 7.9 g/dL (ref 6.5–8.1)

## 2019-12-11 LAB — IRON AND TIBC
Iron: 34 ug/dL (ref 28–170)
Saturation Ratios: 7 % — ABNORMAL LOW (ref 10.4–31.8)
TIBC: 494 ug/dL — ABNORMAL HIGH (ref 250–450)
UIBC: 460 ug/dL

## 2019-12-11 LAB — URINALYSIS, COMPLETE (UACMP) WITH MICROSCOPIC
Bilirubin Urine: NEGATIVE
Glucose, UA: NEGATIVE mg/dL
Hgb urine dipstick: NEGATIVE
Ketones, ur: NEGATIVE mg/dL
Nitrite: NEGATIVE
Protein, ur: 30 mg/dL — AB
Specific Gravity, Urine: 1.015 (ref 1.005–1.030)
pH: 6 (ref 5.0–8.0)

## 2019-12-11 LAB — RESULT

## 2019-12-11 LAB — CBC WITH DIFFERENTIAL/PLATELET
Abs Immature Granulocytes: 0.32 10*3/uL — ABNORMAL HIGH (ref 0.00–0.07)
Basophils Absolute: 0.1 10*3/uL (ref 0.0–0.1)
Basophils Relative: 1 %
Eosinophils Absolute: 0.7 10*3/uL — ABNORMAL HIGH (ref 0.0–0.5)
Eosinophils Relative: 4 %
HCT: 37.2 % (ref 36.0–46.0)
Hemoglobin: 11.6 g/dL — ABNORMAL LOW (ref 12.0–15.0)
Immature Granulocytes: 2 %
Lymphocytes Relative: 18 %
Lymphs Abs: 3.4 10*3/uL (ref 0.7–4.0)
MCH: 26.7 pg (ref 26.0–34.0)
MCHC: 31.2 g/dL (ref 30.0–36.0)
MCV: 85.5 fL (ref 80.0–100.0)
Monocytes Absolute: 0.7 10*3/uL (ref 0.1–1.0)
Monocytes Relative: 4 %
Neutro Abs: 13.1 10*3/uL — ABNORMAL HIGH (ref 1.7–7.7)
Neutrophils Relative %: 71 %
Platelets: 581 10*3/uL — ABNORMAL HIGH (ref 150–400)
RBC: 4.35 MIL/uL (ref 3.87–5.11)
RDW: 15.9 % — ABNORMAL HIGH (ref 11.5–15.5)
WBC: 18.2 10*3/uL — ABNORMAL HIGH (ref 4.0–10.5)
nRBC: 0 % (ref 0.0–0.2)

## 2019-12-11 LAB — MAGNESIUM: Magnesium: 1.7 mg/dL (ref 1.7–2.4)

## 2019-12-11 LAB — VITAMIN B12: Vitamin B-12: 1378 pg/mL — ABNORMAL HIGH (ref 180–914)

## 2019-12-11 LAB — TISSUE CULTURE AEROBE/ANAEROBE

## 2019-12-11 LAB — FERRITIN: Ferritin: 49 ng/mL (ref 11–307)

## 2019-12-11 LAB — FOLATE: Folate: 51.3 ng/mL (ref >5.9)

## 2019-12-12 ENCOUNTER — Inpatient Hospital Stay: Payer: Medicare HMO

## 2019-12-13 ENCOUNTER — Other Ambulatory Visit: Payer: Self-pay

## 2019-12-13 NOTE — Addendum Note (Signed)
Addended by: Jone Baseman on: 12/13/2019 02:16 PM   Modules accepted: Orders

## 2019-12-13 NOTE — Patient Outreach (Signed)
[name]) Care Management  [name]  12/13/2019   Karen Lewis [birth year] [id number]  Subjective: Telephone call to patient to address red alert.  Patient having some feelings of depression.  PHQ-9=12. Patient reports being placed on Klonopin for depression. Patient admits that recent surgery has been a challenge but is managing.  She states she has not heard from home health just yet but states she will be calling her orthopedic today as previously advised.  Patient has support from her fiance- Karen Lewis since being home from [name].  She his able to bathe and dress herself. Her fiancee helps with meals and getting out of the house.  Patient has two areas to left and right hip which she cares for and states they are healing well.  Patient also has diabetes which she has done some better. Her last A1c was 6.8 down from 7.1.  Discussed making better food choices in helping to control diabetes and being healthier.  She verbalized understanding.    Discussed with patient [name] services. She is agreeable to nurse outreach calls.  Patient asked about resources for counseling.  Will complete social work referral for this request.    Objective:   Encounter Medications:  Outpatient Encounter Medications as of 12/13/2019  Medication Sig  . ACCU-CHEK AVIVA PLUS test strip   . acetaminophen (TYLENOL) 650 MG CR tablet Take 650-1,300 mg by mouth every 8 (eight) hours as needed for pain.  Marland Kitchen acidophilus (RISAQUAD) CAPS capsule Take 1 capsule by mouth daily.  Marland Kitchen albuterol (PROVENTIL HFA;VENTOLIN HFA) 108 (90 BASE) MCG/ACT inhaler Inhale 2 puffs into the lungs every 6 (six) hours as needed for wheezing or shortness of breath.  Marland Kitchen amitriptyline (ELAVIL) 100 MG tablet Take 100 mg by mouth at bedtime.  . Ascorbic Acid (VITAMIN C) 1000 MG tablet Take 1,000 mg by mouth daily.   . Biotin 10 MG CAPS Take 10 mg by mouth daily.   . butalbital-acetaminophen-caffeine (FIORICET) 50-325-40 MG  tablet Take 1 tablet by mouth every 6 (six) hours as needed for migraine.   . calcium carbonate (TUMS EX) 750 MG chewable tablet Chew 1 tablet by mouth daily.   . clonazePAM (KLONOPIN) 0.5 MG tablet Take 1 tablet (0.5 mg total) by mouth 3 (three) times daily as needed for anxiety.  . Cyanocobalamin (B-12) 2500 MCG TABS Take 2,500 mcg by mouth daily.  . diclofenac sodium (VOLTAREN) 1 % GEL Apply 2 g topically 4 (four) times daily.  . diphenhydrAMINE (BENADRYL) 25 MG tablet Take 25 mg by mouth every 8 (eight) hours as needed for itching or allergies.   Marland Kitchen enoxaparin (LOVENOX) 40 MG/0.4ML injection Inject 0.4 mLs (40 mg total) into the skin daily.  Marland Kitchen EPINEPHrine 0.3 mg/0.3 mL IJ SOAJ injection Inject 0.3 mg into the muscle as needed for anaphylaxis.   Marland Kitchen ergocalciferol (VITAMIN D2) 1.25 MG (50000 UT) capsule Take 50,000 Units by mouth 2 (two) times a week.   . famotidine (PEPCID) 20 MG tablet Take 20 mg by mouth daily as needed for heartburn or indigestion.   Marland Kitchen gentamicin ointment (GARAMYCIN) 0.1 % Apply 1 application topically daily.  Marland Kitchen glucose blood (ACCU-CHEK AVIVA PLUS) test strip Use 2 (two) times daily  . levocetirizine (XYZAL) 5 MG tablet Take 5 mg by mouth at bedtime.   . magnesium oxide (MAG-OX) 400 MG tablet Take 800-1,200 mg by mouth See admin instructions. Take 1200 mg by mouth in the morning and 800 mg at bedtime  . meclizine (  ANTIVERT) 25 MG tablet Take 25 mg by mouth 3 (three) times daily as needed for dizziness.   . medroxyPROGESTERone (DEPO-PROVERA) 150 MG/ML injection Inject 150 mg into the muscle every 3 (three) months.   . metFORMIN (GLUCOPHAGE) 1000 MG tablet Take 1,000 mg by mouth 2 (two) times daily with a meal.   . metoprolol tartrate (LOPRESSOR) 50 MG tablet Take 75 mg by mouth 2 (two) times daily.   . montelukast (SINGULAIR) 10 MG tablet Take 10 mg by mouth at bedtime.   . Multiple Vitamin (MULTI-VITAMIN) tablet Take 2 tablets by mouth daily.  . naloxone (NARCAN) nasal spray  4 mg/0.1 mL Spray into one nostril. Repeat with second device into other nostril after 2-3 minutes if no or minimal response. (Patient taking differently: Place 0.4 mg into the nose once. Spray into one nostril. Repeat with second device into other nostril after 2-3 minutes if no or minimal response.)  . naphazoline-pheniramine (NAPHCON-A) 0.025-0.3 % ophthalmic solution Place 1 drop into both eyes 4 (four) times daily as needed for eye irritation.  Marland Kitchen omeprazole (PRILOSEC) 40 MG capsule Take 40 mg by mouth in the morning and at bedtime.  Marland Kitchen oxyCODONE (OXY IR/ROXICODONE) 5 MG immediate release tablet Take 1 tablet (5 mg total) by mouth 5 (five) times daily. Must last 30 days  . pregabalin (LYRICA) 225 MG capsule Take 1 capsule (225 mg total) by mouth 2 (two) times daily.  . QUEtiapine (SEROQUEL) 100 MG tablet Take 100 mg by mouth at bedtime.  . rosuvastatin (CRESTOR) 40 MG tablet Take 40 mg by mouth daily.   . sertraline (ZOLOFT) 100 MG tablet Take 200 mg by mouth daily.   Marland Kitchen spironolactone (ALDACTONE) 25 MG tablet Take 25 mg by mouth 2 (two) times daily.   . SUPER B COMPLEX/C PO Take 1 tablet by mouth at bedtime.   Marland Kitchen tiZANidine (ZANAFLEX) 4 MG tablet Take 4 mg by mouth 2 (two) times daily as needed for muscle spasms.   Marland Kitchen triamcinolone (NASACORT AQ) 55 MCG/ACT AERO nasal inhaler Place 2 sprays into the nose 2 (two) times daily as needed (allergies).   . zolpidem (AMBIEN) 10 MG tablet Take 10 mg by mouth at bedtime as needed for sleep.  Marland Kitchen zonisamide (ZONEGRAN) 50 MG capsule Take 150 mg by mouth at bedtime.   Marland Kitchen levothyroxine (SYNTHROID) 125 MCG tablet Take 250 mcg by mouth daily before breakfast.   . lisinopril (ZESTRIL) 5 MG tablet Take 5 mg by mouth at bedtime.   Marland Kitchen tiZANidine (ZANAFLEX) 4 MG tablet Take 1 tablet (4 mg total) by mouth 2 (two) times daily as needed for muscle spasms.   No facility-administered encounter medications on file as of 12/13/2019.    Functional Status:  In your present state  of health, do you have any difficulty performing the following activities: 12/13/2019 11/27/2019  Hearing? N N  Vision? N N  Difficulty concentrating or making decisions? N N  Comment - -  Walking or climbing stairs? Y N  Comment recent right hip replacement -  Dressing or bathing? Y N  Comment recent right hip replacement -  Doing errands, shopping? Y N  Comment recent hip replacement- fiance helps -  Preparing Food and eating ? Y -  Comment fiance helps -  Using the Toilet? (No Data) -  Comment fiance helps -  In the past six months, have you accidently leaked urine? N -  Do you have problems with loss of bowel control? N -  Managing  your Medications? N -  Managing your Finances? N -  Housekeeping or managing your Housekeeping? Y -  Comment fiance helps -  Some recent data might be hidden    Fall/Depression Screening: Fall Risk  12/13/2019 08/23/2018 05/17/2018  Falls in the past year? 0 0 No  Number falls in past yr: - - -  Comment - - -  Injury with Fall? - - -  Comment - - -  Risk Factor Category  - - -  Risk for fall due to : - - -  Risk for fall due to: Comment - - -  Follow up - - -   PHQ 2/9 Scores 12/13/2019 04/27/2018 04/13/2018 03/30/2018 03/27/2018 03/07/2018 01/30/2018  PHQ - 2 Score 4 0 0 0 0 0 0  PHQ- 9 Score 12 - - - - - -  Exception Documentation - - - - - - -    Assessment: Patient recovering from Right Hip Replacement. Patient also has other chronic health issues that she is managing.  Patient will benefit from [facility] support at this time.    Plan:  [facility] CM Care Plan Problem One     Most Recent Value  Care Plan Problem One  Recent Hip Replacement  Role Documenting the Problem One  Care Management Telephonic Coordinator  Care Plan for Problem One  Active  THN Long Term Goal   Patient will not experience unplanned hospital readmission within the next 31 days  THN Long Term Goal Start Date  12/13/19  Interventions for Problem One Long Term Goal  Discussed with patient  current clinical condition since hospital discharge, confirmed that patient has all medications and denies concerns around medications, reviewed post-hospital discharge instructions with patient and confirmed that patient has reliable transportation to all scheduled provider appointments post-hospital discharge.  THN CM Short Term Goal #1   Patient will actively participate in home health services within the next 14 days  THN CM Short Term Goal #1 Start Date  12/13/19  Interventions for Short Term Goal #1  Discussed with patient home health.  Patient to contact orthopedic office today to inquire about home health starting.  THN CM Short Term Goal #2   Patient will keep A1c less than 7.0 within 30 days.  THN CM Short Term Goal #2 Start Date  12/13/19  Interventions for Short Term Goal #2  Discussed with patient diet and importance of food choices in relation to diabetes and weight loss.     RN CM will provide ongoing education and support to patient through phone calls.   RN CM will send welcome packet with consent to patient.   RN CM will send initial barriers letter, assessment, and care plan to primary care physician.   RN CM will contact patient again in the month of April and patient agrees to next contact.    Jone Baseman, RN, MSN [facility] Care Management Coordinator Direct Line [phone] Cell [phone] Toll Free: [phone]  Fax: [phone]

## 2019-12-14 ENCOUNTER — Encounter: Payer: Self-pay | Admitting: Oncology

## 2019-12-14 ENCOUNTER — Encounter: Payer: Self-pay | Admitting: *Deleted

## 2019-12-14 ENCOUNTER — Other Ambulatory Visit: Payer: Self-pay

## 2019-12-14 ENCOUNTER — Inpatient Hospital Stay: Payer: Medicare HMO | Attending: Oncology | Admitting: Oncology

## 2019-12-14 ENCOUNTER — Other Ambulatory Visit: Payer: Self-pay | Admitting: *Deleted

## 2019-12-14 DIAGNOSIS — D509 Iron deficiency anemia, unspecified: Secondary | ICD-10-CM | POA: Diagnosis not present

## 2019-12-14 DIAGNOSIS — G894 Chronic pain syndrome: Secondary | ICD-10-CM | POA: Diagnosis not present

## 2019-12-14 DIAGNOSIS — Z9884 Bariatric surgery status: Secondary | ICD-10-CM | POA: Diagnosis not present

## 2019-12-14 DIAGNOSIS — M797 Fibromyalgia: Secondary | ICD-10-CM | POA: Diagnosis not present

## 2019-12-14 NOTE — Progress Notes (Signed)
Patient verified using two identifiers for virtual visit via telephone today.  Patient does not offer any problems today.  

## 2019-12-14 NOTE — Patient Outreach (Signed)
[facility]) Care Management  12/14/2019  Karen Lewis [birth year] [id number]   CSW was able to make initial contact with patient today to perform phone assessment, as well as assess and assist with social work needs and services.  CSW introduced self, explained role and types of services provided through [facility] ([place] [facility]).  CSW further explained to patient that CSW works with patient's Telephonic RNCM, also with [facility], Dionne Leath.  CSW then explained the reason for the call, indicating that [name] thought that patient would benefit from social work services and resources to assist with arranging counseling and supportive services for symptoms of depression and past trauma.  CSW obtained two HIPAA compliant identifiers from patient, which included patient's name and date of birth.  Patient admitted to having received counseling services in the past, but did not wish to return to the [facility] in [place], [place], feeling as though they have already provided her with the maximum amount of services that they are able to provide.  CSW provided patient with the following list of counseling services, due to her request to see someone that specializes in trauma therapy:  [place]; [facility]; [place]; [place], [place].  These resources were provided to patient via her email ([email or web address]), per her request.  Patient indicated that she prefers to meet with a therapist face-to-face; otherwise, CSW agreed to provide telephonic counseling services to patient.  CSW will perform a case closure on patient, as all goals of treatment have been met from social work standpoint and no additional social work needs have been identified at this time.  CSW will notify patient's Telephonic RNCM with [facility], Jon Billings of CSW's plans to close patient's case.  CSW will fax an update to patient's Primary Care Physician, Dr. Doy Hutching to ensure that he is aware of CSW's involvement with patient's plan of care.  CSW was able to confirm that patient has the correct contact information for CSW, encouraging patient to contact CSW directly if additional social work needs arise in the near future, or if patient changes her mind about needing assistance with the referral process.  Patient voiced understanding and was agreeable to this plan.   Nat Christen, BSW, MSW, LCSW  Licensed Education officer, [facility]  Mailing [place] [address] [place], [place] [address] Physical Address-[address], [place], [place] [address] Toll Free Main # [phone] Fax # [phone] Cell # [phone]  Office # [phone] [email or web address]

## 2019-12-14 NOTE — Progress Notes (Signed)
HEMATOLOGY-ONCOLOGY TeleHEALTH VISIT PROGRESS NOTE  I connected with Karen Lewis on 12/14/19 at  2:15 PM EDT by video enabled telemedicine visit and verified that I am speaking with the correct person using two identifiers. I discussed the limitations, risks, security and privacy concerns of performing an evaluation and management service by telemedicine and the availability of in-person appointments. I also discussed with the patient that there may be a patient responsible charge related to this service. The patient expressed understanding and agreed to proceed.   Other persons participating in the visit and their role in the encounter:  None  Patient's location: Home  Provider's location: office Chief Complaint: follow up for anemia.    INTERVAL HISTORY Karen Lewis is a 49 y.o. female who has above history reviewed by me today presents for follow up visit for management of anemia.  Problems and complaints are listed below:  She recently had hip replacement, in recovery process.  She is on prophylactic Lovenox.   Review of Systems  Constitutional: Positive for fatigue. Negative for appetite change, chills and fever.  HENT:   Negative for hearing loss and voice change.   Eyes: Negative for eye problems.  Respiratory: Negative for chest tightness and cough.   Cardiovascular: Negative for chest pain.  Gastrointestinal: Negative for abdominal distention, abdominal pain and blood in stool.  Endocrine: Negative for hot flashes.  Genitourinary: Negative for difficulty urinating and frequency.   Musculoskeletal: Positive for arthralgias.  Skin: Negative for itching and rash.  Neurological: Negative for extremity weakness.  Hematological: Negative for adenopathy.  Psychiatric/Behavioral: Negative for confusion.    Past Medical History:  Diagnosis Date  . Anemia   . Anginal pain ([place])   . Anxiety   . Arthralgia of hip 07/29/2015  . Arthritis   . Arthritis, degenerative 07/29/2015  .  Asthma   . Cephalalgia 07/25/2014  . Dependence on unknown drug ([place])    multiplt controlled drug dependence  . Depression   . Diabetes mellitus without complication ([place])   . Dysrhythmia   . Eczema   . Fibromyalgia   . Gastritis   . GERD (gastroesophageal reflux disease)   . Gonalgia 07/29/2015   Overview:  Overview:  The patient has had bilateral intra-articular Hyalgan injections done on 07/16/2014 and although she seems to do well with this type of therapy, apparently her insurance company does not want to pay for they Hyalgan. On 11/27/2014 the patient underwent a bilateral genicular nerve block with excellent results. On 01/28/2015 she had a right knee genicular radiofrequency ablatio  . Gout   . H/O cardiovascular disorder 03/10/2015  . H/O surgical procedure 12/05/2012   Overview:  LSG ([facility] - April 2013)    . H/O thyroid disease 03/10/2015  . Headache   . Herpes   . History of artificial joint 07/29/2015  . History of hiatal hernia   . Hypertension   . Hypomagnesemia   . Hypothyroidism   . IDA (iron deficiency anemia) 05/28/2019  . LBP (low back pain) 07/29/2015  . Neuromuscular disorder ([place])   . Obesity   . PCOS (polycystic ovarian syndrome)   . Primary osteoarthritis of both knees 07/29/2015  . Sleep apnea    not using a Cpap machine at this time  . Thyroid nodule bilateral  . Umbilical hernia    Past Surgical History:  Procedure Laterality Date  . APPENDECTOMY    . CARPAL TUNNEL RELEASE Bilateral   . CHOLECYSTECTOMY    . DIAGNOSTIC LAPAROSCOPY    .  HIATAL HERNIA REPAIR    . JOINT REPLACEMENT Left hip  . LAPAROSCOPIC PARTIAL GASTRECTOMY    . left trigger finger    . peniculectomy N/A 07/05/2018  . ROUX-EN-Y GASTRIC BYPASS  06/03/2017  . SHOULDER ARTHROSCOPY Right   . THYROIDECTOMY N/A 11/12/2015   Procedure: THYROIDECTOMY;  Surgeon: Clyde Canterbury, MD;  Location: [facility] ORS;  Service: ENT;  Laterality: N/A;  . TOTAL HIP ARTHROPLASTY Right 11/27/2019   Procedure:  TOTAL HIP ARTHROPLASTY ANTERIOR APPROACH;  Surgeon: Hessie Knows, MD;  Location: [facility] ORS;  Service: Orthopedics;  Laterality: Right;  . TRIGGER FINGER RELEASE Right     Family History  Problem Relation Age of Onset  . Anxiety disorder Mother   . Depression Mother   . Alcohol abuse Mother   . Diabetes Mother   . Hypertension Mother   . Kidney cancer Mother   . Sleep apnea Mother   . Alcohol abuse Father   . Anxiety disorder Father   . Depression Father   . Post-traumatic stress disorder Father   . Kidney failure Father   . COPD Father   . Diabetes Father   . Hypertension Father   . Sleep apnea Father   . Depression Brother   . Diabetes Brother   . Hypertension Brother   . Sleep apnea Brother   . Breast cancer Paternal Aunt   . Bladder Cancer Neg Hx   . Prostate cancer Neg Hx     Social History   Socioeconomic History  . Marital status: Significant Other    Spouse name: Not on file  . Number of children: 0  . Years of education: 50  . Highest education level: High school graduate  Occupational History  . Occupation: Disabled  Tobacco Use  . Smoking status: Never Smoker  . Smokeless tobacco: Never Used  Substance and Sexual Activity  . Alcohol use: No    Alcohol/week: 0.0 standard drinks  . Drug use: No  . Sexual activity: Not Currently    Birth control/protection: Injection  Other Topics Concern  . Not on file  Social History Narrative  . Not on file   Social Determinants of Health   Financial Resource Strain: Low Risk   . Difficulty of Paying Living Expenses: Not hard at all  Food Insecurity: No Food Insecurity  . Worried About Charity fundraiser in the Last Year: Never true  . Ran Out of Food in the Last Year: Never true  Transportation Needs: No Transportation Needs  . Lack of Transportation (Medical): No  . Lack of Transportation (Non-Medical): No  Physical Activity: Inactive  . Days of Exercise per Week: 0 days  . Minutes of Exercise per Session:  0 min  Stress: Stress Concern Present  . Feeling of Stress : Rather much  Social Connections: Unknown  . Frequency of Communication with Friends and Family: More than three times a week  . Frequency of Social Gatherings with Friends and Family: More than three times a week  . Attends Religious Services: More than 4 times per year  . Active Member of Clubs or Organizations: No  . Attends Archivist Meetings: Never  . Marital Status: Not on file  Intimate Partner Violence: Not At Risk  . Fear of Current or Ex-Partner: No  . Emotionally Abused: No  . Physically Abused: No  . Sexually Abused: No    Current Outpatient Medications on File Prior to Visit  Medication Sig Dispense Refill  . ACCU-CHEK AVIVA PLUS test  strip     . acetaminophen (TYLENOL) 650 MG CR tablet Take 650-1,300 mg by mouth every 8 (eight) hours as needed for pain.    Marland Kitchen acidophilus (RISAQUAD) CAPS capsule Take 1 capsule by mouth daily.    Marland Kitchen albuterol (PROVENTIL HFA;VENTOLIN HFA) 108 (90 BASE) MCG/ACT inhaler Inhale 2 puffs into the lungs every 6 (six) hours as needed for wheezing or shortness of breath.    Marland Kitchen amitriptyline (ELAVIL) 100 MG tablet Take 100 mg by mouth at bedtime.    . Ascorbic Acid (VITAMIN C) 1000 MG tablet Take 1,000 mg by mouth daily.     . Biotin 10 MG CAPS Take 10 mg by mouth daily.     . butalbital-acetaminophen-caffeine (FIORICET) 50-325-40 MG tablet Take 1 tablet by mouth every 6 (six) hours as needed for migraine.     . calcium carbonate (TUMS EX) 750 MG chewable tablet Chew 1 tablet by mouth daily.     . clonazePAM (KLONOPIN) 0.5 MG tablet Take 1 tablet (0.5 mg total) by mouth 3 (three) times daily as needed for anxiety. 30 tablet 0  . Cyanocobalamin (B-12) 2500 MCG TABS Take 2,500 mcg by mouth daily.    . diclofenac sodium (VOLTAREN) 1 % GEL Apply 2 g topically 4 (four) times daily. 100 g 1  . diphenhydrAMINE (BENADRYL) 25 MG tablet Take 25 mg by mouth every 8 (eight) hours as needed for  itching or allergies.     Marland Kitchen enoxaparin (LOVENOX) 40 MG/0.4ML injection Inject 0.4 mLs (40 mg total) into the skin daily. 5.6 mL 0  . EPINEPHrine 0.3 mg/0.3 mL IJ SOAJ injection Inject 0.3 mg into the muscle as needed for anaphylaxis.     Marland Kitchen ergocalciferol (VITAMIN D2) 1.25 MG (50000 UT) capsule Take 50,000 Units by mouth 2 (two) times a week.     . famotidine (PEPCID) 20 MG tablet Take 20 mg by mouth daily as needed for heartburn or indigestion.     Marland Kitchen gentamicin ointment (GARAMYCIN) 0.1 % Apply 1 application topically daily.    Marland Kitchen glucose blood (ACCU-CHEK AVIVA PLUS) test strip Use 2 (two) times daily    . levocetirizine (XYZAL) 5 MG tablet Take 5 mg by mouth at bedtime.     Marland Kitchen levothyroxine (SYNTHROID) 125 MCG tablet Take 250 mcg by mouth daily before breakfast.     . lisinopril (ZESTRIL) 5 MG tablet Take 5 mg by mouth at bedtime.     . magnesium oxide (MAG-OX) 400 MG tablet Take 800-1,200 mg by mouth See admin instructions. Take 1200 mg by mouth in the morning and 800 mg at bedtime    . meclizine (ANTIVERT) 25 MG tablet Take 25 mg by mouth 3 (three) times daily as needed for dizziness.     . medroxyPROGESTERone (DEPO-PROVERA) 150 MG/ML injection Inject 150 mg into the muscle every 3 (three) months.     . metFORMIN (GLUCOPHAGE) 1000 MG tablet Take 1,000 mg by mouth 2 (two) times daily with a meal.     . metoprolol tartrate (LOPRESSOR) 50 MG tablet Take 75 mg by mouth 2 (two) times daily.     . montelukast (SINGULAIR) 10 MG tablet Take 10 mg by mouth at bedtime.     . Multiple Vitamin (MULTI-VITAMIN) tablet Take 2 tablets by mouth daily.    . naloxone (NARCAN) nasal spray 4 mg/0.1 mL Spray into one nostril. Repeat with second device into other nostril after 2-3 minutes if no or minimal response. (Patient taking differently: Place 0.4 mg into  the nose once. Spray into one nostril. Repeat with second device into other nostril after 2-3 minutes if no or minimal response.) 1 kit 0  .  naphazoline-pheniramine (NAPHCON-A) 0.025-0.3 % ophthalmic solution Place 1 drop into both eyes 4 (four) times daily as needed for eye irritation.    Marland Kitchen omeprazole (PRILOSEC) 40 MG capsule Take 40 mg by mouth in the morning and at bedtime.    Marland Kitchen oxyCODONE (OXY IR/ROXICODONE) 5 MG immediate release tablet Take 1 tablet (5 mg total) by mouth 5 (five) times daily. Must last 30 days 150 tablet 0  . pregabalin (LYRICA) 225 MG capsule Take 1 capsule (225 mg total) by mouth 2 (two) times daily. 60 capsule 5  . QUEtiapine (SEROQUEL) 100 MG tablet Take 100 mg by mouth at bedtime.    . rosuvastatin (CRESTOR) 40 MG tablet Take 40 mg by mouth daily.     . sertraline (ZOLOFT) 100 MG tablet Take 200 mg by mouth daily.     Marland Kitchen spironolactone (ALDACTONE) 25 MG tablet Take 25 mg by mouth 2 (two) times daily.     . SUPER B COMPLEX/C PO Take 1 tablet by mouth at bedtime.     Marland Kitchen tiZANidine (ZANAFLEX) 4 MG tablet Take 1 tablet (4 mg total) by mouth 2 (two) times daily as needed for muscle spasms. 30 tablet 0  . triamcinolone (NASACORT AQ) 55 MCG/ACT AERO nasal inhaler Place 2 sprays into the nose 2 (two) times daily as needed (allergies).     . zolpidem (AMBIEN) 10 MG tablet Take 10 mg by mouth at bedtime as needed for sleep.    Marland Kitchen zonisamide (ZONEGRAN) 50 MG capsule Take 150 mg by mouth at bedtime.     Marland Kitchen tiZANidine (ZANAFLEX) 4 MG tablet Take 4 mg by mouth 2 (two) times daily as needed for muscle spasms.      No current facility-administered medications on file prior to visit.    Allergies  Allergen Reactions  . Bactrim [Sulfamethoxazole-Trimethoprim] Hives  . Omalizumab Itching and Hives  . Ciprofloxacin Other (See Comments)    myalgia   . Shellfish Allergy Other (See Comments)    unknown  . Clindamycin Rash  . Nsaids Nausea Only and Other (See Comments)    Stomach upset       Observations/Objective: Today's Vitals   12/14/19 1420  PainSc: 0-No pain   There is no height or weight on file to calculate  BMI.  Physical Exam  Constitutional: No distress.  Neurological: She is alert.    CBC    Component Value Date/Time   WBC 18.2 (H) 12/11/2019 1715   RBC 4.35 12/11/2019 1715   HGB 11.6 (L) 12/11/2019 1715   HGB 12.1 12/13/2013 1628   HCT 37.2 12/11/2019 1715   HCT 38.4 12/13/2013 1628   PLT 581 (H) 12/11/2019 1715   PLT 364 12/13/2013 1628   MCV 85.5 12/11/2019 1715   MCV 82 12/13/2013 1628   MCH 26.7 12/11/2019 1715   MCHC 31.2 12/11/2019 1715   RDW 15.9 (H) 12/11/2019 1715   RDW 16.9 (H) 12/13/2013 1628   LYMPHSABS 3.4 12/11/2019 1715   LYMPHSABS 3.2 04/10/2013 1836   MONOABS 0.7 12/11/2019 1715   MONOABS 0.7 04/10/2013 1836   EOSABS 0.7 (H) 12/11/2019 1715   EOSABS 0.3 04/10/2013 1836   BASOSABS 0.1 12/11/2019 1715   BASOSABS 0.1 04/10/2013 1836    CMP     Component Value Date/Time   NA 137 12/11/2019 1715   NA 139  06/17/2014 1811   K 4.3 12/11/2019 1715   K 3.6 06/17/2014 1811   CL 101 12/11/2019 1715   CL 111 (H) 06/17/2014 1811   CO2 24 12/11/2019 1715   CO2 22 06/17/2014 1811   GLUCOSE 112 (H) 12/11/2019 1715   GLUCOSE 99 06/17/2014 1811   BUN 12 12/11/2019 1715   BUN 7 06/17/2014 1811   CREATININE 1.07 (H) 12/11/2019 1715   CREATININE 0.98 06/17/2014 1811   CALCIUM 8.6 (L) 12/11/2019 1715   CALCIUM 8.5 06/17/2014 1811   PROT 7.9 12/11/2019 1715   PROT 7.4 06/17/2014 1811   ALBUMIN 3.7 12/11/2019 1715   ALBUMIN 3.5 06/17/2014 1811   AST 27 12/11/2019 1715   AST 23 06/17/2014 1811   ALT 23 12/11/2019 1715   ALT 22 06/17/2014 1811   ALKPHOS 99 12/11/2019 1715   ALKPHOS 90 06/17/2014 1811   BILITOT 0.4 12/11/2019 1715   BILITOT 0.2 06/17/2014 1811   GFRNONAA >60 12/11/2019 1715   GFRNONAA >60 06/17/2014 1811   GFRNONAA >60 12/13/2013 1628   GFRAA >60 12/11/2019 1715   GFRAA >60 06/17/2014 1811   GFRAA >60 12/13/2013 1628     Assessment and Plan: 1. Iron deficiency anemia, unspecified iron deficiency anemia type   2. History of gastric bypass      Labs are reviewed and discussed with patient. Iron panel showed iron saturation of 7, ferritin 49, hemoglobin 11.6 Recommend to proceed with IV Venofer weekly x 3.    Follow Up Instructions:  4 months.  I discussed the assessment and treatment plan with the patient. The patient was provided an opportunity to ask questions and all were answered. The patient agreed with the plan and demonstrated an understanding of the instructions.  The patient was advised to call back or seek an in-person evaluation if the symptoms worsen or if the condition fails to improve as anticipated.    Earlie Server, MD 12/14/2019 11:44 PM

## 2019-12-18 ENCOUNTER — Other Ambulatory Visit: Payer: Self-pay

## 2019-12-18 NOTE — Patient Outreach (Addendum)
[facility]) Care Management  12/18/2019  OCELIA PASLAY 02-Nov-1970 HT:[id number]   Telephone call to patient after patient called the office.  Patient still has not had therapy come to her home.  Patient requesting the assistance from CM to figure out what is going on.  Patient states there was some issue with one agency not being able to staff case and another could not get insurance approval. Advised patient that CM will call orthopedic surgeon to inquire about status of home health referral and get back with her after CM knows more.  She verbalized understanding.  Telephone call to Dr. Rudene Christians office.  Message left for return call.    Plan: RN CM will wait return call from orthopedic surgeon office.  Update: 150 pm- Telephone call from [place] at Dr. Rudene Christians office.  He states that he sent home health referral to [facility] but not sure of status.  He will call [facility] and get back with CM.  1:55 pm: Telephone call back from [place].  He states that [facility] is working on Warehouse manager from Insurance underwriter.  He states if there is an issue he will let me know.    Telephone call to patient to notify of above home health referral status.  Patient will look to hear from home health.  Advised patient that I would call back if I hear any further updates.  She verbalized understanding and appreciative of the help with referral.     Plan: RN CM will contact patient at previously agreed outreach next week and patient agreeable.    Jone Baseman, RN, MSN [place] [facility] Care Management Coordinator Direct Line [phone] Cell [phone] Toll Free: [phone]  Fax: [phone]

## 2019-12-19 ENCOUNTER — Telehealth: Payer: Self-pay | Admitting: Dermatology

## 2019-12-19 ENCOUNTER — Other Ambulatory Visit: Payer: Self-pay

## 2019-12-19 ENCOUNTER — Inpatient Hospital Stay: Payer: Medicare HMO

## 2019-12-19 VITALS — BP 118/64 | HR 71 | Temp 97.5°F | Resp 18

## 2019-12-19 DIAGNOSIS — D509 Iron deficiency anemia, unspecified: Secondary | ICD-10-CM | POA: Diagnosis not present

## 2019-12-19 MED ORDER — SODIUM CHLORIDE 0.9 % IV SOLN
Freq: Once | INTRAVENOUS | Status: AC
  Filled 2019-12-19: qty 250

## 2019-12-19 MED ORDER — IRON SUCROSE 20 MG/ML IV SOLN
200.0000 mg | Freq: Once | INTRAVENOUS | Status: AC
  Administered 2019-12-19: 15:00:00 200 mg via INTRAVENOUS
  Filled 2019-12-19: qty 10

## 2019-12-19 NOTE — Telephone Encounter (Signed)
Patient advised bacterial culture negative. Still waiting for mycobacterial tissue culture results.

## 2019-12-19 NOTE — Progress Notes (Signed)
Pt tolerated infusion well. Pt and VS stable at discharge.  

## 2019-12-19 NOTE — Telephone Encounter (Signed)
Please let patient know that her bacterial tissue culture from the spot at her left hip came back negative. We are still waiting for the mycobacterial tissue culture result.   Aerobic and anaerobic tissue culture showed no growth --> nothing to do at this time  Thank you!

## 2019-12-21 ENCOUNTER — Ambulatory Visit: Payer: Medicare HMO | Admitting: *Deleted

## 2019-12-23 DIAGNOSIS — E785 Hyperlipidemia, unspecified: Secondary | ICD-10-CM | POA: Diagnosis not present

## 2019-12-23 DIAGNOSIS — Z471 Aftercare following joint replacement surgery: Secondary | ICD-10-CM | POA: Diagnosis not present

## 2019-12-23 DIAGNOSIS — E1159 Type 2 diabetes mellitus with other circulatory complications: Secondary | ICD-10-CM | POA: Diagnosis not present

## 2019-12-23 DIAGNOSIS — G894 Chronic pain syndrome: Secondary | ICD-10-CM | POA: Diagnosis not present

## 2019-12-23 DIAGNOSIS — I10 Essential (primary) hypertension: Secondary | ICD-10-CM | POA: Diagnosis not present

## 2019-12-23 DIAGNOSIS — M47817 Spondylosis without myelopathy or radiculopathy, lumbosacral region: Secondary | ICD-10-CM | POA: Diagnosis not present

## 2019-12-23 DIAGNOSIS — M170 Bilateral primary osteoarthritis of knee: Secondary | ICD-10-CM | POA: Diagnosis not present

## 2019-12-23 DIAGNOSIS — Z96641 Presence of right artificial hip joint: Secondary | ICD-10-CM | POA: Diagnosis not present

## 2019-12-25 DIAGNOSIS — G894 Chronic pain syndrome: Secondary | ICD-10-CM | POA: Diagnosis not present

## 2019-12-25 DIAGNOSIS — M47817 Spondylosis without myelopathy or radiculopathy, lumbosacral region: Secondary | ICD-10-CM | POA: Diagnosis not present

## 2019-12-25 DIAGNOSIS — Z96641 Presence of right artificial hip joint: Secondary | ICD-10-CM | POA: Diagnosis not present

## 2019-12-25 DIAGNOSIS — E1159 Type 2 diabetes mellitus with other circulatory complications: Secondary | ICD-10-CM | POA: Diagnosis not present

## 2019-12-25 DIAGNOSIS — Z471 Aftercare following joint replacement surgery: Secondary | ICD-10-CM | POA: Diagnosis not present

## 2019-12-25 DIAGNOSIS — I10 Essential (primary) hypertension: Secondary | ICD-10-CM | POA: Diagnosis not present

## 2019-12-25 DIAGNOSIS — M170 Bilateral primary osteoarthritis of knee: Secondary | ICD-10-CM | POA: Diagnosis not present

## 2019-12-25 DIAGNOSIS — E785 Hyperlipidemia, unspecified: Secondary | ICD-10-CM | POA: Diagnosis not present

## 2019-12-26 ENCOUNTER — Other Ambulatory Visit: Payer: Self-pay

## 2019-12-26 ENCOUNTER — Ambulatory Visit: Payer: Medicare HMO

## 2019-12-26 DIAGNOSIS — E1159 Type 2 diabetes mellitus with other circulatory complications: Secondary | ICD-10-CM | POA: Diagnosis not present

## 2019-12-26 DIAGNOSIS — Z471 Aftercare following joint replacement surgery: Secondary | ICD-10-CM | POA: Diagnosis not present

## 2019-12-26 DIAGNOSIS — E785 Hyperlipidemia, unspecified: Secondary | ICD-10-CM | POA: Diagnosis not present

## 2019-12-26 DIAGNOSIS — G894 Chronic pain syndrome: Secondary | ICD-10-CM | POA: Diagnosis not present

## 2019-12-26 DIAGNOSIS — I10 Essential (primary) hypertension: Secondary | ICD-10-CM | POA: Diagnosis not present

## 2019-12-26 DIAGNOSIS — Z96641 Presence of right artificial hip joint: Secondary | ICD-10-CM | POA: Diagnosis not present

## 2019-12-26 DIAGNOSIS — M47817 Spondylosis without myelopathy or radiculopathy, lumbosacral region: Secondary | ICD-10-CM | POA: Diagnosis not present

## 2019-12-26 DIAGNOSIS — M170 Bilateral primary osteoarthritis of knee: Secondary | ICD-10-CM | POA: Diagnosis not present

## 2019-12-26 NOTE — Patient Outreach (Signed)
[facility]) Care Management  [place]  12/26/2019   Karen Lewis 1971/06/20 [id number]  Subjective: Telephone call to patient for follow up. Patient reports that she is not doing bad.  She reports that home health is finally on board with PT and ST active.  Patient feels more confident in getting around. She states however she over did it yesterday and has some increased pain.  Discussed importance of pacing herself with activities until she builds up activity tolerance with her new hip replacement.  She verbalized understanding. Patient did speak with social worker but has not looked at resources for counseling but will be.  She is appreciative of resources.   Objective:   Encounter Medications:  Outpatient Encounter Medications as of 12/26/2019  Medication Sig  . ACCU-CHEK AVIVA PLUS test strip   . acetaminophen (TYLENOL) 650 MG CR tablet Take 650-1,300 mg by mouth every 8 (eight) hours as needed for pain.  Marland Kitchen acidophilus (RISAQUAD) CAPS capsule Take 1 capsule by mouth daily.  Marland Kitchen albuterol (PROVENTIL HFA;VENTOLIN HFA) 108 (90 BASE) MCG/ACT inhaler Inhale 2 puffs into the lungs every 6 (six) hours as needed for wheezing or shortness of breath.  Marland Kitchen amitriptyline (ELAVIL) 100 MG tablet Take 100 mg by mouth at bedtime.  . Ascorbic Acid (VITAMIN C) 1000 MG tablet Take 1,000 mg by mouth daily.   . Biotin 10 MG CAPS Take 10 mg by mouth daily.   . butalbital-acetaminophen-caffeine (FIORICET) 50-325-40 MG tablet Take 1 tablet by mouth every 6 (six) hours as needed for migraine.   . calcium carbonate (TUMS EX) 750 MG chewable tablet Chew 1 tablet by mouth daily.   . clonazePAM (KLONOPIN) 0.5 MG tablet Take 1 tablet (0.5 mg total) by mouth 3 (three) times daily as needed for anxiety.  . Cyanocobalamin (B-12) 2500 MCG TABS Take 2,500 mcg by mouth daily.  . diclofenac sodium (VOLTAREN) 1 % GEL Apply 2 g topically 4 (four) times daily.  . diphenhydrAMINE (BENADRYL) 25 MG tablet  Take 25 mg by mouth every 8 (eight) hours as needed for itching or allergies.   Marland Kitchen enoxaparin (LOVENOX) 40 MG/0.4ML injection Inject 0.4 mLs (40 mg total) into the skin daily.  Marland Kitchen EPINEPHrine 0.3 mg/0.3 mL IJ SOAJ injection Inject 0.3 mg into the muscle as needed for anaphylaxis.   Marland Kitchen ergocalciferol (VITAMIN D2) 1.25 MG (50000 UT) capsule Take 50,000 Units by mouth 2 (two) times a week.   . famotidine (PEPCID) 20 MG tablet Take 20 mg by mouth daily as needed for heartburn or indigestion.   Marland Kitchen gentamicin ointment (GARAMYCIN) 0.1 % Apply 1 application topically daily.  Marland Kitchen glucose blood (ACCU-CHEK AVIVA PLUS) test strip Use 2 (two) times daily  . levocetirizine (XYZAL) 5 MG tablet Take 5 mg by mouth at bedtime.   Marland Kitchen levothyroxine (SYNTHROID) 125 MCG tablet Take 250 mcg by mouth daily before breakfast.   . lisinopril (ZESTRIL) 5 MG tablet Take 5 mg by mouth at bedtime.   . magnesium oxide (MAG-OX) 400 MG tablet Take 800-1,200 mg by mouth See admin instructions. Take 1200 mg by mouth in the morning and 800 mg at bedtime  . meclizine (ANTIVERT) 25 MG tablet Take 25 mg by mouth 3 (three) times daily as needed for dizziness.   . medroxyPROGESTERone (DEPO-PROVERA) 150 MG/ML injection Inject 150 mg into the muscle every 3 (three) months.   . metFORMIN (GLUCOPHAGE) 1000 MG tablet Take 1,000 mg by mouth 2 (two) times daily with a meal.   .  metoprolol tartrate (LOPRESSOR) 50 MG tablet Take 75 mg by mouth 2 (two) times daily.   . montelukast (SINGULAIR) 10 MG tablet Take 10 mg by mouth at bedtime.   . Multiple Vitamin (MULTI-VITAMIN) tablet Take 2 tablets by mouth daily.  . naloxone (NARCAN) nasal spray 4 mg/0.1 mL Spray into one nostril. Repeat with second device into other nostril after 2-3 minutes if no or minimal response. (Patient taking differently: Place 0.4 mg into the nose once. Spray into one nostril. Repeat with second device into other nostril after 2-3 minutes if no or minimal response.)  .  naphazoline-pheniramine (NAPHCON-A) 0.025-0.3 % ophthalmic solution Place 1 drop into both eyes 4 (four) times daily as needed for eye irritation.  Marland Kitchen omeprazole (PRILOSEC) 40 MG capsule Take 40 mg by mouth in the morning and at bedtime.  Marland Kitchen oxyCODONE (OXY IR/ROXICODONE) 5 MG immediate release tablet Take 1 tablet (5 mg total) by mouth 5 (five) times daily. Must last 30 days  . pregabalin (LYRICA) 225 MG capsule Take 1 capsule (225 mg total) by mouth 2 (two) times daily.  . QUEtiapine (SEROQUEL) 100 MG tablet Take 100 mg by mouth at bedtime.  . rosuvastatin (CRESTOR) 40 MG tablet Take 40 mg by mouth daily.   . sertraline (ZOLOFT) 100 MG tablet Take 200 mg by mouth daily.   Marland Kitchen spironolactone (ALDACTONE) 25 MG tablet Take 25 mg by mouth 2 (two) times daily.   . SUPER B COMPLEX/C PO Take 1 tablet by mouth at bedtime.   Marland Kitchen tiZANidine (ZANAFLEX) 4 MG tablet Take 4 mg by mouth 2 (two) times daily as needed for muscle spasms.   Marland Kitchen tiZANidine (ZANAFLEX) 4 MG tablet Take 1 tablet (4 mg total) by mouth 2 (two) times daily as needed for muscle spasms.  Marland Kitchen triamcinolone (NASACORT AQ) 55 MCG/ACT AERO nasal inhaler Place 2 sprays into the nose 2 (two) times daily as needed (allergies).   . zolpidem (AMBIEN) 10 MG tablet Take 10 mg by mouth at bedtime as needed for sleep.  Marland Kitchen zonisamide (ZONEGRAN) 50 MG capsule Take 150 mg by mouth at bedtime.    No facility-administered encounter medications on file as of 12/26/2019.    Functional Status:  In your present state of health, do you have any difficulty performing the following activities: 12/14/2019 12/13/2019  Hearing? N N  Vision? N N  Difficulty concentrating or making decisions? N N  Comment - -  Walking or climbing stairs? Y Y  Comment Right Hip Replacement. recent right hip replacement  Dressing or bathing? Y Y  Comment Right Hip Replacement. recent right hip replacement  Doing errands, shopping? Y Y  Comment Right Hip Replacement. recent hip replacement-  fiance helps  Preparing Food and eating ? Y Y  Comment Right Hip Replacement. fiance helps  Using the Toilet? Y (No Data)  Comment Right Hip Replacement. fiance helps  In the past six months, have you accidently leaked urine? N N  Do you have problems with loss of bowel control? N N  Managing your Medications? N N  Managing your Finances? N N  Housekeeping or managing your Housekeeping? Y Y  Comment Right Hip Replacement. fiance helps  Some recent data might be hidden    Fall/Depression Screening: Fall Risk  12/14/2019 12/13/2019 08/23/2018  Falls in the past year? 0 0 0  Number falls in past yr: 0 - -  Comment - - -  Injury with Fall? 0 - -  Comment - - -  Risk Factor Category  - - -  Risk for fall due to : No Fall Risks - -  Risk for fall due to: Comment - - -  Follow up Education provided;Falls prevention discussed - -   PHQ 2/9 Scores 12/14/2019 12/13/2019 04/27/2018 04/13/2018 03/30/2018 03/27/2018 03/07/2018  PHQ - 2 Score 4 4 0 0 0 0 0  PHQ- 9 Score 12 12 - - - - -  Exception Documentation - - - - - - -    Assessment: Patient managing post discharge.  Therapy now involved and working with patient regularly.   Plan:  [facility] CM Care Plan Problem One     Most Recent Value  Care Plan Problem One  Recent Hip Replacement  Role Documenting the Problem One  Care Management Telephonic Coordinator  Care Plan for Problem One  Active  [facility] Long Term Goal   Patient will not experience unplanned hospital readmission within the next 31 days  [facility] Long Term Goal Start Date  12/13/19  Interventions for Problem One Long Term Goal  Patient participating in PT and ST.  Patient ambulating better and pain managed.  [facility] CM Short Term Goal #1   Patient will actively participate in home health services within the next 14 days  [facility] CM Short Term Goal #1 Start Date  12/13/19  [facility] CM Short Term Goal #1 Met Date  12/26/19  [facility] CM Short Term Goal #2   Patient will keep A1c less than 7.0 within 30 days.  [facility] CM  Short Term Goal #2 Start Date  12/13/19     RN CM will contact patient later this month and patient agreeable.    Jone Baseman, RN, MSN [place] Management Care Management Coordinator Direct Line [phone] Cell [phone] Toll Free: [phone]  Fax: [phone]

## 2019-12-27 ENCOUNTER — Other Ambulatory Visit: Payer: Self-pay | Admitting: Oncology

## 2019-12-27 ENCOUNTER — Ambulatory Visit: Payer: Medicare HMO

## 2019-12-27 ENCOUNTER — Inpatient Hospital Stay: Payer: Medicare HMO

## 2019-12-27 ENCOUNTER — Encounter: Payer: Self-pay | Admitting: Dermatology

## 2019-12-27 ENCOUNTER — Ambulatory Visit: Payer: Medicare HMO | Admitting: Dermatology

## 2019-12-27 ENCOUNTER — Other Ambulatory Visit: Payer: Self-pay

## 2019-12-27 ENCOUNTER — Encounter: Payer: Self-pay | Admitting: Infectious Diseases

## 2019-12-27 ENCOUNTER — Ambulatory Visit: Payer: Medicare HMO | Attending: Infectious Diseases | Admitting: Infectious Diseases

## 2019-12-27 VITALS — BP 107/55 | HR 110 | Temp 98.4°F | Resp 18

## 2019-12-27 VITALS — BP 104/68 | HR 86 | Temp 98.4°F | Resp 16 | Ht 64.0 in | Wt 380.0 lb

## 2019-12-27 DIAGNOSIS — Z5189 Encounter for other specified aftercare: Secondary | ICD-10-CM

## 2019-12-27 DIAGNOSIS — R222 Localized swelling, mass and lump, trunk: Secondary | ICD-10-CM | POA: Insufficient documentation

## 2019-12-27 DIAGNOSIS — G4730 Sleep apnea, unspecified: Secondary | ICD-10-CM | POA: Diagnosis not present

## 2019-12-27 DIAGNOSIS — E1159 Type 2 diabetes mellitus with other circulatory complications: Secondary | ICD-10-CM | POA: Diagnosis not present

## 2019-12-27 DIAGNOSIS — Z96641 Presence of right artificial hip joint: Secondary | ICD-10-CM

## 2019-12-27 DIAGNOSIS — Z7984 Long term (current) use of oral hypoglycemic drugs: Secondary | ICD-10-CM | POA: Insufficient documentation

## 2019-12-27 DIAGNOSIS — Z8614 Personal history of Methicillin resistant Staphylococcus aureus infection: Secondary | ICD-10-CM | POA: Insufficient documentation

## 2019-12-27 DIAGNOSIS — D649 Anemia, unspecified: Secondary | ICD-10-CM | POA: Diagnosis not present

## 2019-12-27 DIAGNOSIS — M1611 Unilateral primary osteoarthritis, right hip: Secondary | ICD-10-CM | POA: Insufficient documentation

## 2019-12-27 DIAGNOSIS — M25452 Effusion, left hip: Secondary | ICD-10-CM | POA: Diagnosis present

## 2019-12-27 DIAGNOSIS — E119 Type 2 diabetes mellitus without complications: Secondary | ICD-10-CM | POA: Insufficient documentation

## 2019-12-27 DIAGNOSIS — Z0000 Encounter for general adult medical examination without abnormal findings: Secondary | ICD-10-CM | POA: Diagnosis not present

## 2019-12-27 DIAGNOSIS — F329 Major depressive disorder, single episode, unspecified: Secondary | ICD-10-CM | POA: Diagnosis not present

## 2019-12-27 DIAGNOSIS — I890 Lymphedema, not elsewhere classified: Secondary | ICD-10-CM | POA: Diagnosis not present

## 2019-12-27 DIAGNOSIS — T8489XA Other specified complication of internal orthopedic prosthetic devices, implants and grafts, initial encounter: Secondary | ICD-10-CM | POA: Insufficient documentation

## 2019-12-27 DIAGNOSIS — S71002A Unspecified open wound, left hip, initial encounter: Secondary | ICD-10-CM

## 2019-12-27 DIAGNOSIS — D509 Iron deficiency anemia, unspecified: Secondary | ICD-10-CM

## 2019-12-27 DIAGNOSIS — T148XXA Other injury of unspecified body region, initial encounter: Secondary | ICD-10-CM

## 2019-12-27 DIAGNOSIS — Z79899 Other long term (current) drug therapy: Secondary | ICD-10-CM | POA: Insufficient documentation

## 2019-12-27 DIAGNOSIS — M797 Fibromyalgia: Secondary | ICD-10-CM | POA: Insufficient documentation

## 2019-12-27 DIAGNOSIS — Y838 Other surgical procedures as the cause of abnormal reaction of the patient, or of later complication, without mention of misadventure at the time of the procedure: Secondary | ICD-10-CM | POA: Insufficient documentation

## 2019-12-27 DIAGNOSIS — S7001XA Contusion of right hip, initial encounter: Secondary | ICD-10-CM

## 2019-12-27 DIAGNOSIS — I10 Essential (primary) hypertension: Secondary | ICD-10-CM | POA: Insufficient documentation

## 2019-12-27 DIAGNOSIS — Z96643 Presence of artificial hip joint, bilateral: Secondary | ICD-10-CM | POA: Diagnosis not present

## 2019-12-27 MED ORDER — SODIUM CHLORIDE 0.9 % IV SOLN: 200.0000 mg | Freq: Once | INTRAVENOUS | Status: DC

## 2019-12-27 MED ORDER — SODIUM CHLORIDE 0.9 % IV SOLN
INTRAVENOUS | Status: DC
  Filled 2019-12-27: qty 250

## 2019-12-27 MED ORDER — IRON SUCROSE 20 MG/ML IV SOLN
200.0000 mg | Freq: Once | INTRAVENOUS | Status: AC
  Administered 2019-12-27: 200 mg via INTRAVENOUS
  Filled 2019-12-27: qty 10

## 2019-12-27 NOTE — Patient Instructions (Addendum)
You are here for  follow up- you recently had a temp of 100 and wbc on 18 on 12/11/19 You had a hip replacement on 3/16 and today you are seeing the ortho- please ask them to repeat wbc and decide whether  You have a collection at the surgical site- ? Ultrasound But your surgical site looks great!

## 2019-12-27 NOTE — Progress Notes (Signed)
NAME: Karen Lewis  DOB: 1971/04/04  MRN: [id number]  Date/Time: 12/27/2019 11:37 AM   Subjective:   She had rt hip replacement on by anterior approach on 11/27/19 by Dr.Menz On 3/30 she had gone to her PCP as temp of 100.1 and wbc was 18, urine was neg, CXR N,  There was some swelling at the site of the rt hip surgical site and she is asked to see ortho and has an appt this afternoon She is also getting iron infusion once a week for anemia She says she is tired but no fever The left thigh wound has almost healed She had received levaquin for MRSA /pseudomonas infection in feb/ march  She has complicated infectious disease   Karen Lewis is a 49 y.o. 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 Pt suffers from lymphedema of legs and has been seeing vascular and is to get compression wraps and pumps soon.  She had Mycobacterium abscessus on the abdominal wall following lipectomy and was treated for nearly 4 months and those lesions have  completely healed  Then In NOV 2020 she developed areas of new induration on the left upper thigh She recently started with indurated area of skin over the left thigh and rt inner thigh Nov 2020       She was given 2 courses of antibiotics ( augmentin/doxy) and it did not make any difference. She was sent to the dermatologist and Dr.Moye did a punch biopsy and sent for HPE, fungal, mycobacterial culture. Pathology came back as atypical vascular proliferation involving the dermis.   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 There was no calciphylaxis She is followed by the dermatologist Dr.Moye She then developed pseudomonas and MRSA infection of the biopsy site and was treated with levaquin 77m PO Qd for 5 days feb 2021         Past Medical  History:  Diagnosis Date  . Anemia   . Anginal pain ([place]   . Anxiety   . Arthralgia of hip 07/29/2015  . Arthritis   . Arthritis, degenerative 07/29/2015  . Asthma   . Cephalalgia 07/25/2014  . Dependence on unknown drug ([place]    multiplt controlled drug dependence  . Depression   . Diabetes mellitus without complication ([place]   . Dysrhythmia   . Eczema   . Fibromyalgia   . Gastritis   . GERD (gastroesophageal reflux disease)   . Gonalgia 07/29/2015   Overview:  Overview:  The patient has had bilateral intra-articular Hyalgan injections done on 07/16/2014 and although she seems to do well with this type of therapy, apparently her insurance company does not want to pay for they Hyalgan. On 11/27/2014 the patient underwent a bilateral genicular nerve block with excellent results. On 01/28/2015 she had a right knee genicular radiofrequency ablatio  . Gout   . H/O cardiovascular disorder 03/10/2015  . H/O surgical procedure 12/05/2012   Overview:  LSG ([facility] - April 2013)    . H/O thyroid disease 03/10/2015  . Headache   . Herpes   . History of artificial joint 07/29/2015  . History of hiatal hernia   . Hypertension   . Hypomagnesemia   . Hypothyroidism   . IDA (iron deficiency anemia) 05/28/2019  . LBP (low back pain) 07/29/2015  . Neuromuscular disorder ([place]   .  Obesity   . PCOS (polycystic ovarian syndrome)   . Primary osteoarthritis of both knees 07/29/2015  . Sleep apnea    not using a Cpap machine at this time  . Thyroid nodule bilateral  . Umbilical hernia     Past Surgical History:  Procedure Laterality Date  . APPENDECTOMY    . CARPAL TUNNEL RELEASE Bilateral   . CHOLECYSTECTOMY    . DIAGNOSTIC LAPAROSCOPY    . HIATAL HERNIA REPAIR    . JOINT REPLACEMENT Left hip  . LAPAROSCOPIC PARTIAL GASTRECTOMY    . left trigger finger    . peniculectomy N/A 07/05/2018  . ROUX-EN-Y GASTRIC BYPASS  06/03/2017  . SHOULDER ARTHROSCOPY Right   . THYROIDECTOMY N/A 11/12/2015    Procedure: THYROIDECTOMY;  Surgeon: Paul Bennett, MD;  Location: [facility] ORS;  Service: ENT;  Laterality: N/A;  . TOTAL HIP ARTHROPLASTY Right 11/27/2019   Procedure: TOTAL HIP ARTHROPLASTY ANTERIOR APPROACH;  Surgeon: Menz, Michael, MD;  Location: [facility] ORS;  Service: Orthopedics;  Laterality: Right;  . TRIGGER FINGER RELEASE Right     Social History   Socioeconomic History  . Marital status: Significant Other    Spouse name: Not on file  . Number of children: 0  . Years of education: 12  . Highest education level: High school graduate  Occupational History  . Occupation: Disabled  Tobacco Use  . Smoking status: Never Smoker  . Smokeless tobacco: Never Used  Substance and Sexual Activity  . Alcohol use: No    Alcohol/week: 0.0 standard drinks  . Drug use: No  . Sexual activity: Not Currently    Birth control/protection: Injection  Other Topics Concern  . Not on file  Social History Narrative  . Not on file   Social Determinants of Health   Financial Resource Strain: Low Risk   . Difficulty of Paying Living Expenses: Not hard at all  Food Insecurity: No Food Insecurity  . Worried About Running Out of Food in the Last Year: Never true  . Ran Out of Food in the Last Year: Never true  Transportation Needs: No Transportation Needs  . Lack of Transportation (Medical): No  . Lack of Transportation (Non-Medical): No  Physical Activity: Inactive  . Days of Exercise per Week: 0 days  . Minutes of Exercise per Session: 0 min  Stress: Stress Concern Present  . Feeling of Stress : Rather much  Social Connections: Unknown  . Frequency of Communication with Friends and Family: More than three times a week  . Frequency of Social Gatherings with Friends and Family: More than three times a week  . Attends Religious Services: More than 4 times per year  . Active Member of Clubs or Organizations: No  . Attends Club or Organization Meetings: Never  . Marital Status: Not on file  Intimate  Partner Violence: Not At Risk  . Fear of Current or Ex-Partner: No  . Emotionally Abused: No  . Physically Abused: No  . Sexually Abused: No    Family History  Problem Relation Age of Onset  . Anxiety disorder Mother   . Depression Mother   . Alcohol abuse Mother   . Diabetes Mother   . Hypertension Mother   . Kidney cancer Mother   . Sleep apnea Mother   . Alcohol abuse Father   . Anxiety disorder Father   . Depression Father   . Post-traumatic stress disorder Father   . Kidney failure Father   . COPD Father   . Diabetes   Father   . Hypertension Father   . Sleep apnea Father   . Depression Brother   . Diabetes Brother   . Hypertension Brother   . Sleep apnea Brother   . Breast cancer Paternal Aunt   . Bladder Cancer Neg Hx   . Prostate cancer Neg Hx    Allergies  Allergen Reactions  . Bactrim [Sulfamethoxazole-Trimethoprim] Hives  . Omalizumab Itching and Hives  . Ciprofloxacin Other (See Comments)    myalgia   . Shellfish Allergy Other (See Comments)    unknown  . Clindamycin Rash  . Nsaids Nausea Only and Other (See Comments)    Stomach upset    ? Current Outpatient Medications  Medication Sig Dispense Refill  . ACCU-CHEK AVIVA PLUS test strip     . acetaminophen (TYLENOL) 650 MG CR tablet Take 650-1,300 mg by mouth every 8 (eight) hours as needed for pain.    . acidophilus (RISAQUAD) CAPS capsule Take 1 capsule by mouth daily.    . albuterol (PROVENTIL HFA;VENTOLIN HFA) 108 (90 BASE) MCG/ACT inhaler Inhale 2 puffs into the lungs every 6 (six) hours as needed for wheezing or shortness of breath.    . amitriptyline (ELAVIL) 100 MG tablet Take 100 mg by mouth at bedtime.    . Ascorbic Acid (VITAMIN C) 1000 MG tablet Take 1,000 mg by mouth daily.     . Biotin 10 MG CAPS Take 10 mg by mouth daily.     . butalbital-acetaminophen-caffeine (FIORICET) 50-325-40 MG tablet Take 1 tablet by mouth every 6 (six) hours as needed for migraine.     . calcium carbonate (TUMS  EX) 750 MG chewable tablet Chew 1 tablet by mouth daily.     . clonazePAM (KLONOPIN) 0.5 MG tablet Take 1 tablet (0.5 mg total) by mouth 3 (three) times daily as needed for anxiety. 30 tablet 0  . Cyanocobalamin (B-12) 2500 MCG TABS Take 2,500 mcg by mouth daily.    . diclofenac sodium (VOLTAREN) 1 % GEL Apply 2 g topically 4 (four) times daily. 100 g 1  . diphenhydrAMINE (BENADRYL) 25 MG tablet Take 25 mg by mouth every 8 (eight) hours as needed for itching or allergies.     . enoxaparin (LOVENOX) 40 MG/0.4ML injection Inject 0.4 mLs (40 mg total) into the skin daily. 5.6 mL 0  . EPINEPHrine 0.3 mg/0.3 mL IJ SOAJ injection Inject 0.3 mg into the muscle as needed for anaphylaxis.     . ergocalciferol (VITAMIN D2) 1.25 MG (50000 UT) capsule Take 50,000 Units by mouth 2 (two) times a week.     . famotidine (PEPCID) 20 MG tablet Take 20 mg by mouth daily as needed for heartburn or indigestion.     . gentamicin ointment (GARAMYCIN) 0.1 % Apply 1 application topically daily.    . glucose blood (ACCU-CHEK AVIVA PLUS) test strip Use 2 (two) times daily    . levocetirizine (XYZAL) 5 MG tablet Take 5 mg by mouth at bedtime.     . levothyroxine (SYNTHROID) 125 MCG tablet Take 250 mcg by mouth daily before breakfast.     . lisinopril (ZESTRIL) 5 MG tablet Take 5 mg by mouth at bedtime.     . magnesium oxide (MAG-OX) 400 MG tablet Take 800-1,200 mg by mouth See admin instructions. Take 1200 mg by mouth in the morning and 800 mg at bedtime    . meclizine (ANTIVERT) 25 MG tablet Take 25 mg by mouth 3 (three) times daily as needed for dizziness.     .   medroxyPROGESTERone (DEPO-PROVERA) 150 MG/ML injection Inject 150 mg into the muscle every 3 (three) months.     . metFORMIN (GLUCOPHAGE) 1000 MG tablet Take 1,000 mg by mouth 2 (two) times daily with a meal.     . metoprolol tartrate (LOPRESSOR) 50 MG tablet Take 75 mg by mouth 2 (two) times daily.     . montelukast (SINGULAIR) 10 MG tablet Take 10 mg by mouth at  bedtime.     . Multiple Vitamin (MULTI-VITAMIN) tablet Take 2 tablets by mouth daily.    . naloxone (NARCAN) nasal spray 4 mg/0.1 mL Spray into one nostril. Repeat with second device into other nostril after 2-3 minutes if no or minimal response. (Patient taking differently: Place 0.4 mg into the nose once. Spray into one nostril. Repeat with second device into other nostril after 2-3 minutes if no or minimal response.) 1 kit 0  . naphazoline-pheniramine (NAPHCON-A) 0.025-0.3 % ophthalmic solution Place 1 drop into both eyes 4 (four) times daily as needed for eye irritation.    . omeprazole (PRILOSEC) 40 MG capsule Take 40 mg by mouth in the morning and at bedtime.    . oxyCODONE (OXY IR/ROXICODONE) 5 MG immediate release tablet Take 1 tablet (5 mg total) by mouth 5 (five) times daily. Must last 30 days 150 tablet 0  . pregabalin (LYRICA) 225 MG capsule Take 1 capsule (225 mg total) by mouth 2 (two) times daily. 60 capsule 5  . QUEtiapine (SEROQUEL) 100 MG tablet Take 100 mg by mouth at bedtime.    . rosuvastatin (CRESTOR) 40 MG tablet Take 40 mg by mouth daily.     . sertraline (ZOLOFT) 100 MG tablet Take 200 mg by mouth daily.     . spironolactone (ALDACTONE) 25 MG tablet Take 25 mg by mouth 2 (two) times daily.     . SUPER B COMPLEX/C PO Take 1 tablet by mouth at bedtime.     . tiZANidine (ZANAFLEX) 4 MG tablet Take 4 mg by mouth 2 (two) times daily as needed for muscle spasms.     . tiZANidine (ZANAFLEX) 4 MG tablet Take 1 tablet (4 mg total) by mouth 2 (two) times daily as needed for muscle spasms. 30 tablet 0  . triamcinolone (NASACORT AQ) 55 MCG/ACT AERO nasal inhaler Place 2 sprays into the nose 2 (two) times daily as needed (allergies).     . zolpidem (AMBIEN) 10 MG tablet Take 10 mg by mouth at bedtime as needed for sleep.    . zonisamide (ZONEGRAN) 50 MG capsule Take 150 mg by mouth at bedtime.      No current facility-administered medications for this visit.     Abtx:    Anti-infectives (From admission, onward)   None      REVIEW OF SYSTEMS:  Const: negative fever, negative chills, negative weight loss Eyes: negative diplopia or visual changes, negative eye pain ENT: negative coryza, beginning of sore throat Resp: negative cough, hemoptysis, dyspnea Cards: negative for chest pain, palpitations, has lower extremity edema GU:intermittent dysuria which resolves with cranberry juice GI: Negative for abdominal pain, occasional loose stool , no bleeding, constipation Skin: negative for rash and pruritus Heme: negative for easy bruising and gum/nose bleeding MS:Rt  hip pain, much better after THA Neurolo:negative for headaches, dizziness, vertigo, memory problems  Psych: Some anxiety Allergy/Immunology-medication allergy as noted above. Objective:  VITALS:  BP 104/68   Pulse 86   Temp 98.4 F (36.9 C) (Oral)   Resp 16   Ht 5'   4" (1.626 m)   Wt (!) 380 lb (172.4 kg)   SpO2 96%   BMI 65.23 kg/m   PHYSICAL EXAM:  General: Alert, cooperative, no distress, appears stated age.  In wheel chair- high BMI Abdomen: abdominal wall wounds have all healed  Left upper thigh wound is almost healed Rt THA site - surgcal scar completely healed Small fluctuant area felt, no warmth or tenderness Rt SS   Left thigh   Neurologic: Grossly non-focal Pertinent Labs CBC Latest Ref Rng & Units 12/11/2019 12/05/2019 12/01/2019  WBC 4.0 - 10.5 K/uL 18.2(H) 9.3 11.1(H)  Hemoglobin 12.0 - 15.0 g/dL 11.6(L) 9.1(L) 9.8(L)  Hematocrit 36.0 - 46.0 % 37.2 27.8(L) 30.6(L)  Platelets 150 - 400 K/uL 581(H) 349 251   ? Impression/Recommendation  Right hip arthritis. S/p THA- surgical scar healed well There is afluctuant swelling- ? Seroma She is being assessed by ortho this afternoon Theycan repeat WBC and also do ultrasound to see whether  the collection will have to be aspirated if WBC is high   ? 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  Completely  healed Had 4 months of treatment and completed end of oct/nov  Recent peau de orange induration left upper thigh- biopsy was not calciphylaxis- it is healing She will follow up with [facility] ( dermatologist) and get the AFB culture results  Recent MRSA/pseudomonas of the punch biopsy site- treated   Left total hip arthroplasty.  Gastric bypass surgery  leading to weight loss necessitating panniculectomy. Complicated by Mycobacterium abscessus infection  Hypertension on metoprolol and lisinopril and spironolactone  Diabetes mellitus on metformin  Depression on multiple SSRIs.   ___________________________________________________   Discussed the management with patient- Follow up PRN

## 2019-12-27 NOTE — Patient Instructions (Addendum)
Wash wound QD  restart mupirocin QD, if you cant keep covered reapply 3 to 4 times a day Keep covered  Wound Care Instructions  1. Cleanse wound gently with soap and water once a day then pat dry with clean gauze. Apply a thing coat of Petrolatum (petroleum jelly, "Vaseline") over the wound (unless you have an allergy to this). We recommend that you use a new, sterile tube of Vaseline. Do not pick or remove scabs. Do not remove the yellow or white "healing tissue" from the base of the wound.  2. Cover the wound with fresh, clean, nonstick gauze and secure with paper tape. You may use Band-Aids in place of gauze and tape if the would is small enough, but would recommend trimming much of the tape off as there is often too much. Sometimes Band-Aids can irritate the skin.  3. You should call the office for your biopsy report after 1 week if you have not already been contacted.  4. If you experience any problems, such as abnormal amounts of bleeding, swelling, significant bruising, significant pain, or evidence of infection, please call the office immediately.

## 2019-12-27 NOTE — Progress Notes (Signed)
   Follow-Up Visit   Subjective  Karen Lewis is a 49 y.o. female who presents for the following: Skin Ulcer (Follow up).  Patient wants to make sure that the biopsy site has healed appropriately.  The ulcer seems to be healing and is less painful.  The following portions of the chart were reviewed this encounter and updated as appropriate: Tobacco  Allergies  Meds  Problems  Med Hx  Surg Hx  Fam Hx      Review of Systems: No other skin or systemic complaints.  Objective  Well appearing patient in no apparent distress; mood and affect are within normal limits.  A focused examination was performed including Left hip. Relevant physical exam findings are noted in the Assessment and Plan.  Objective  Left Hip: Ulcer, improved  Assessment & Plan  Hematoma Right Hip  S/P aspiration Secondary to hip replacement, no evidence of infection on exam today. Recommend to follow up with Ortho if she develops any changes at the area or has concerns  Lymphedema Right Thigh  Lymphedema vs lipedema Recommend trial of lymphedema massage  Open wound Left Hip  Bacterial tissue culture showed no growth. Awaiting final result for mycobacterial tissue culture.   Wash wound QD with soap and water restart mupirocin QD, if you cant keep covered reapply 3 to 4 times a day Keep covered   Return if symptoms worsen or fail to improve.   Karen Lewis, CMA, am acting as scribe for Karen Gleason, MD .  Documentation: I have reviewed the above documentation for accuracy and completeness, and I agree with the above.  Karen Gleason, MD

## 2019-12-28 ENCOUNTER — Other Ambulatory Visit: Payer: Self-pay | Admitting: Dermatology

## 2019-12-28 DIAGNOSIS — I10 Essential (primary) hypertension: Secondary | ICD-10-CM | POA: Diagnosis not present

## 2019-12-28 DIAGNOSIS — E1159 Type 2 diabetes mellitus with other circulatory complications: Secondary | ICD-10-CM | POA: Diagnosis not present

## 2019-12-28 DIAGNOSIS — Z471 Aftercare following joint replacement surgery: Secondary | ICD-10-CM | POA: Diagnosis not present

## 2019-12-28 DIAGNOSIS — Z96641 Presence of right artificial hip joint: Secondary | ICD-10-CM | POA: Diagnosis not present

## 2019-12-28 DIAGNOSIS — G894 Chronic pain syndrome: Secondary | ICD-10-CM | POA: Diagnosis not present

## 2019-12-28 DIAGNOSIS — E785 Hyperlipidemia, unspecified: Secondary | ICD-10-CM | POA: Diagnosis not present

## 2019-12-28 DIAGNOSIS — M47817 Spondylosis without myelopathy or radiculopathy, lumbosacral region: Secondary | ICD-10-CM | POA: Diagnosis not present

## 2019-12-28 DIAGNOSIS — M170 Bilateral primary osteoarthritis of knee: Secondary | ICD-10-CM | POA: Diagnosis not present

## 2019-12-31 DIAGNOSIS — E1159 Type 2 diabetes mellitus with other circulatory complications: Secondary | ICD-10-CM | POA: Diagnosis not present

## 2019-12-31 DIAGNOSIS — G894 Chronic pain syndrome: Secondary | ICD-10-CM | POA: Diagnosis not present

## 2019-12-31 DIAGNOSIS — Z471 Aftercare following joint replacement surgery: Secondary | ICD-10-CM | POA: Diagnosis not present

## 2019-12-31 DIAGNOSIS — Z96641 Presence of right artificial hip joint: Secondary | ICD-10-CM | POA: Diagnosis not present

## 2019-12-31 DIAGNOSIS — M47817 Spondylosis without myelopathy or radiculopathy, lumbosacral region: Secondary | ICD-10-CM | POA: Diagnosis not present

## 2019-12-31 DIAGNOSIS — M170 Bilateral primary osteoarthritis of knee: Secondary | ICD-10-CM | POA: Diagnosis not present

## 2020-01-01 ENCOUNTER — Telehealth: Payer: Self-pay | Admitting: Pain Medicine

## 2020-01-01 DIAGNOSIS — M170 Bilateral primary osteoarthritis of knee: Secondary | ICD-10-CM | POA: Diagnosis not present

## 2020-01-01 DIAGNOSIS — E1159 Type 2 diabetes mellitus with other circulatory complications: Secondary | ICD-10-CM | POA: Diagnosis not present

## 2020-01-01 DIAGNOSIS — Z96641 Presence of right artificial hip joint: Secondary | ICD-10-CM | POA: Diagnosis not present

## 2020-01-01 DIAGNOSIS — G894 Chronic pain syndrome: Secondary | ICD-10-CM | POA: Diagnosis not present

## 2020-01-01 DIAGNOSIS — I10 Essential (primary) hypertension: Secondary | ICD-10-CM | POA: Diagnosis not present

## 2020-01-01 DIAGNOSIS — E785 Hyperlipidemia, unspecified: Secondary | ICD-10-CM | POA: Diagnosis not present

## 2020-01-01 DIAGNOSIS — M47817 Spondylosis without myelopathy or radiculopathy, lumbosacral region: Secondary | ICD-10-CM | POA: Diagnosis not present

## 2020-01-01 DIAGNOSIS — Z471 Aftercare following joint replacement surgery: Secondary | ICD-10-CM | POA: Diagnosis not present

## 2020-01-01 NOTE — Telephone Encounter (Signed)
Please call patient to schedule Hyalgan. There is already an order.

## 2020-01-01 NOTE — Telephone Encounter (Signed)
Patient would like to know how long after hip replacement before she can get knee injections. Hip surgery was March 16. She wants to go ahead and schedule knee injections once Dr. [name] gives ok to do so.

## 2020-01-02 ENCOUNTER — Inpatient Hospital Stay: Payer: Medicare HMO

## 2020-01-03 DIAGNOSIS — E785 Hyperlipidemia, unspecified: Secondary | ICD-10-CM | POA: Diagnosis not present

## 2020-01-03 DIAGNOSIS — G894 Chronic pain syndrome: Secondary | ICD-10-CM | POA: Diagnosis not present

## 2020-01-03 DIAGNOSIS — Z471 Aftercare following joint replacement surgery: Secondary | ICD-10-CM | POA: Diagnosis not present

## 2020-01-03 DIAGNOSIS — I10 Essential (primary) hypertension: Secondary | ICD-10-CM | POA: Diagnosis not present

## 2020-01-03 DIAGNOSIS — Z96641 Presence of right artificial hip joint: Secondary | ICD-10-CM | POA: Diagnosis not present

## 2020-01-03 DIAGNOSIS — M170 Bilateral primary osteoarthritis of knee: Secondary | ICD-10-CM | POA: Diagnosis not present

## 2020-01-03 DIAGNOSIS — M47817 Spondylosis without myelopathy or radiculopathy, lumbosacral region: Secondary | ICD-10-CM | POA: Diagnosis not present

## 2020-01-03 DIAGNOSIS — E1159 Type 2 diabetes mellitus with other circulatory complications: Secondary | ICD-10-CM | POA: Diagnosis not present

## 2020-01-08 DIAGNOSIS — I10 Essential (primary) hypertension: Secondary | ICD-10-CM | POA: Diagnosis not present

## 2020-01-08 DIAGNOSIS — M47817 Spondylosis without myelopathy or radiculopathy, lumbosacral region: Secondary | ICD-10-CM | POA: Diagnosis not present

## 2020-01-08 DIAGNOSIS — M170 Bilateral primary osteoarthritis of knee: Secondary | ICD-10-CM | POA: Diagnosis not present

## 2020-01-08 DIAGNOSIS — E785 Hyperlipidemia, unspecified: Secondary | ICD-10-CM | POA: Diagnosis not present

## 2020-01-08 DIAGNOSIS — Z471 Aftercare following joint replacement surgery: Secondary | ICD-10-CM | POA: Diagnosis not present

## 2020-01-08 DIAGNOSIS — E1159 Type 2 diabetes mellitus with other circulatory complications: Secondary | ICD-10-CM | POA: Diagnosis not present

## 2020-01-08 DIAGNOSIS — Z96641 Presence of right artificial hip joint: Secondary | ICD-10-CM | POA: Diagnosis not present

## 2020-01-08 DIAGNOSIS — G894 Chronic pain syndrome: Secondary | ICD-10-CM | POA: Diagnosis not present

## 2020-01-09 ENCOUNTER — Other Ambulatory Visit: Payer: Self-pay

## 2020-01-09 ENCOUNTER — Inpatient Hospital Stay: Payer: Medicare HMO

## 2020-01-09 VITALS — BP 122/74 | HR 78 | Temp 97.5°F | Resp 18

## 2020-01-09 DIAGNOSIS — R0600 Dyspnea, unspecified: Secondary | ICD-10-CM | POA: Diagnosis not present

## 2020-01-09 DIAGNOSIS — I10 Essential (primary) hypertension: Secondary | ICD-10-CM | POA: Diagnosis not present

## 2020-01-09 DIAGNOSIS — D509 Iron deficiency anemia, unspecified: Secondary | ICD-10-CM | POA: Diagnosis not present

## 2020-01-09 DIAGNOSIS — E119 Type 2 diabetes mellitus without complications: Secondary | ICD-10-CM | POA: Diagnosis not present

## 2020-01-09 DIAGNOSIS — Z0000 Encounter for general adult medical examination without abnormal findings: Secondary | ICD-10-CM | POA: Diagnosis not present

## 2020-01-09 DIAGNOSIS — K219 Gastro-esophageal reflux disease without esophagitis: Secondary | ICD-10-CM | POA: Diagnosis not present

## 2020-01-09 DIAGNOSIS — G4733 Obstructive sleep apnea (adult) (pediatric): Secondary | ICD-10-CM | POA: Diagnosis not present

## 2020-01-09 DIAGNOSIS — E079 Disorder of thyroid, unspecified: Secondary | ICD-10-CM | POA: Diagnosis not present

## 2020-01-09 DIAGNOSIS — E785 Hyperlipidemia, unspecified: Secondary | ICD-10-CM | POA: Diagnosis not present

## 2020-01-09 DIAGNOSIS — Z6841 Body Mass Index (BMI) 40.0 and over, adult: Secondary | ICD-10-CM | POA: Diagnosis not present

## 2020-01-09 DIAGNOSIS — J4531 Mild persistent asthma with (acute) exacerbation: Secondary | ICD-10-CM | POA: Diagnosis not present

## 2020-01-09 LAB — ACID FAST SMEAR+CULTURE W/RFLX
Acid Fast Culture: NEGATIVE
Acid Fast Smear: NEGATIVE

## 2020-01-09 MED ORDER — SODIUM CHLORIDE 0.9 % IV SOLN: 200.0000 mg | Freq: Once | INTRAVENOUS | Status: DC

## 2020-01-09 MED ORDER — SODIUM CHLORIDE 0.9 % IV SOLN
Freq: Once | INTRAVENOUS | Status: AC
  Filled 2020-01-09: qty 250

## 2020-01-09 MED ORDER — IRON SUCROSE 20 MG/ML IV SOLN
200.0000 mg | Freq: Once | INTRAVENOUS | Status: AC
  Administered 2020-01-09: 14:00:00 200 mg via INTRAVENOUS
  Filled 2020-01-09: qty 10

## 2020-01-09 NOTE — Patient Outreach (Signed)
[facility]) Care Management  [place]  01/09/2020   Karen Lewis Jul 19, 1971 [id number]  Subjective: Telephone call to patient for follow up. Patient reports she is doing pretty good.  She reports no pain just soreness to right hip site.  She states she is getting around better but now having some knee trouble and will see doctor to have injections to knees. Patient states that PT still coming to the home 2/week with her goal being to walk without walker.  Patient reports her blood sugars are between 130-140.  Discussed carbohydrates and modifying her diet. She verbalized understanding and voices no concerns.    Objective:   Encounter Medications:  Outpatient Encounter Medications as of 01/09/2020  Medication Sig  . ACCU-CHEK AVIVA PLUS test strip   . acetaminophen (TYLENOL) 650 MG CR tablet Take 650-1,300 mg by mouth every 8 (eight) hours as needed for pain.  Marland Kitchen acidophilus (RISAQUAD) CAPS capsule Take 1 capsule by mouth daily.  Marland Kitchen albuterol (PROVENTIL HFA;VENTOLIN HFA) 108 (90 BASE) MCG/ACT inhaler Inhale 2 puffs into the lungs every 6 (six) hours as needed for wheezing or shortness of breath.  Marland Kitchen amitriptyline (ELAVIL) 100 MG tablet Take 100 mg by mouth at bedtime.  . Ascorbic Acid (VITAMIN C) 1000 MG tablet Take 1,000 mg by mouth daily.   . Biotin 10 MG CAPS Take 10 mg by mouth daily.   . butalbital-acetaminophen-caffeine (FIORICET) 50-325-40 MG tablet Take 1 tablet by mouth every 6 (six) hours as needed for migraine.   . calcium carbonate (TUMS EX) 750 MG chewable tablet Chew 1 tablet by mouth daily.   . clonazePAM (KLONOPIN) 0.5 MG tablet Take 1 tablet (0.5 mg total) by mouth 3 (three) times daily as needed for anxiety.  . Cyanocobalamin (B-12) 2500 MCG TABS Take 2,500 mcg by mouth daily.  . diclofenac sodium (VOLTAREN) 1 % GEL Apply 2 g topically 4 (four) times daily.  . diphenhydrAMINE (BENADRYL) 25 MG tablet Take 25 mg by mouth every 8 (eight) hours as  needed for itching or allergies.   Marland Kitchen EPINEPHrine 0.3 mg/0.3 mL IJ SOAJ injection Inject 0.3 mg into the muscle as needed for anaphylaxis.   Marland Kitchen ergocalciferol (VITAMIN D2) 1.25 MG (50000 UT) capsule Take 50,000 Units by mouth 2 (two) times a week.   . famotidine (PEPCID) 20 MG tablet Take 20 mg by mouth daily as needed for heartburn or indigestion.   Marland Kitchen glucose blood (ACCU-CHEK AVIVA PLUS) test strip Use 2 (two) times daily  . levocetirizine (XYZAL) 5 MG tablet Take 5 mg by mouth at bedtime.   Marland Kitchen levothyroxine (SYNTHROID) 125 MCG tablet Take 250 mcg by mouth daily before breakfast.   . lisinopril (ZESTRIL) 5 MG tablet Take 5 mg by mouth at bedtime.   . magnesium oxide (MAG-OX) 400 MG tablet Take 800-1,200 mg by mouth See admin instructions. Take 1200 mg by mouth in the morning and 800 mg at bedtime  . meclizine (ANTIVERT) 25 MG tablet Take 25 mg by mouth 3 (three) times daily as needed for dizziness.   . medroxyPROGESTERone (DEPO-PROVERA) 150 MG/ML injection Inject 150 mg into the muscle every 3 (three) months.   . metFORMIN (GLUCOPHAGE) 1000 MG tablet Take 1,000 mg by mouth 2 (two) times daily with a meal.   . metoprolol tartrate (LOPRESSOR) 50 MG tablet Take 75 mg by mouth 2 (two) times daily.   . montelukast (SINGULAIR) 10 MG tablet Take 10 mg by mouth at bedtime.   . Multiple  Vitamin (MULTI-VITAMIN) tablet Take 2 tablets by mouth daily.  . mupirocin ointment (BACTROBAN) 2 % APPLY TO WOUND DAILY.  . naloxone (NARCAN) nasal spray 4 mg/0.1 mL Spray into one nostril. Repeat with second device into other nostril after 2-3 minutes if no or minimal response. (Patient taking differently: Place 0.4 mg into the nose once. Spray into one nostril. Repeat with second device into other nostril after 2-3 minutes if no or minimal response.)  . naphazoline-pheniramine (NAPHCON-A) 0.025-0.3 % ophthalmic solution Place 1 drop into both eyes 4 (four) times daily as needed for eye irritation.  Marland Kitchen omeprazole (PRILOSEC)  40 MG capsule Take 40 mg by mouth in the morning and at bedtime.  Marland Kitchen oxyCODONE (OXY IR/ROXICODONE) 5 MG immediate release tablet Take 1 tablet (5 mg total) by mouth 5 (five) times daily. Must last 30 days  . pregabalin (LYRICA) 225 MG capsule Take 1 capsule (225 mg total) by mouth 2 (two) times daily.  . QUEtiapine (SEROQUEL) 100 MG tablet Take 100 mg by mouth at bedtime.  . rosuvastatin (CRESTOR) 40 MG tablet Take 40 mg by mouth daily.   . sertraline (ZOLOFT) 100 MG tablet Take 200 mg by mouth daily.   Marland Kitchen spironolactone (ALDACTONE) 25 MG tablet Take 25 mg by mouth 2 (two) times daily.   . SUPER B COMPLEX/C PO Take 1 tablet by mouth at bedtime.   Marland Kitchen tiZANidine (ZANAFLEX) 4 MG tablet Take 1 tablet (4 mg total) by mouth 2 (two) times daily as needed for muscle spasms.  Marland Kitchen triamcinolone (NASACORT AQ) 55 MCG/ACT AERO nasal inhaler Place 2 sprays into the nose 2 (two) times daily as needed (allergies).   . zolpidem (AMBIEN) 10 MG tablet Take 10 mg by mouth at bedtime as needed for sleep.  Marland Kitchen zonisamide (ZONEGRAN) 50 MG capsule Take 150 mg by mouth at bedtime.    No facility-administered encounter medications on file as of 01/09/2020.    Functional Status:  In your present state of health, do you have any difficulty performing the following activities: 12/14/2019 12/13/2019  Hearing? N N  Vision? N N  Difficulty concentrating or making decisions? N N  Comment - -  Walking or climbing stairs? Y Y  Comment Right Hip Replacement. recent right hip replacement  Dressing or bathing? Y Y  Comment Right Hip Replacement. recent right hip replacement  Doing errands, shopping? Y Y  Comment Right Hip Replacement. recent hip replacement- fiance helps  Preparing Food and eating ? Y Y  Comment Right Hip Replacement. fiance helps  Using the Toilet? Y (No Data)  Comment Right Hip Replacement. fiance helps  In the past six months, have you accidently leaked urine? N N  Do you have problems with loss of bowel control?  N N  Managing your Medications? N N  Managing your Finances? N N  Housekeeping or managing your Housekeeping? Y Y  Comment Right Hip Replacement. fiance helps  Some recent data might be hidden    Fall/Depression Screening: Fall Risk  12/14/2019 12/13/2019 08/23/2018  Falls in the past year? 0 0 0  Number falls in past yr: 0 - -  Comment - - -  Injury with Fall? 0 - -  Comment - - -  Risk Factor Category  - - -  Risk for fall due to : No Fall Risks - -  Risk for fall due to: Comment - - -  Follow up Education provided;Falls prevention discussed - -   PHQ 2/9 Scores 12/14/2019 12/13/2019  04/27/2018 04/13/2018 03/30/2018 03/27/2018 03/07/2018  PHQ - 2 Score 4 4 0 0 0 0 0  PHQ- 9 Score 12 12 - - - - -  Exception Documentation - - - - - - -    Assessment: Patient continues to recover from right hip replacement.    Plan:  [facility] CM Care Plan Problem One     Most Recent Value  Care Plan Problem One  Recent Hip Replacement  Role Documenting the Problem One  Care Management Telephonic Coordinator  Care Plan for Problem One  Active  [facility] Long Term Goal   Patient will not experience unplanned hospital readmission within the next 31 days  [facility]N Long Term Goal Start Date  12/13/19  Interventions for Problem One Long Term Goal  Patient continues to work with therapy 2/week in the home.  Patient reports just soreness to surgical site. Discussed remaining active for recovery.   THN CM Short Term Goal #2   Patient will keep A1c less than 7.0 within 30 days.  [facility]N CM Short Term Goal #2 Start Date  12/13/19  Interventions for Short Term Goal #2  RN CM discussed with patient blood sugars. Patient sugar ranges 130-140 per patient. Discussed limiting carbohydrates and modifying diet.       RN CM will contact in the month of May and patient agreeable.  Jone Baseman, RN, MSN [place] [facility] Care Management Coordinator Direct Line [phone] Cell [phone] Toll Free: [phone]  Fax:  [phone]

## 2020-01-09 NOTE — Progress Notes (Signed)
Pt tolerated infusion well. Pt and VS stable at discharge. No s/s of distress noted.  

## 2020-01-10 ENCOUNTER — Encounter: Payer: Self-pay | Admitting: Dermatology

## 2020-01-10 DIAGNOSIS — G894 Chronic pain syndrome: Secondary | ICD-10-CM | POA: Diagnosis not present

## 2020-01-10 DIAGNOSIS — M47817 Spondylosis without myelopathy or radiculopathy, lumbosacral region: Secondary | ICD-10-CM | POA: Diagnosis not present

## 2020-01-10 DIAGNOSIS — Z96641 Presence of right artificial hip joint: Secondary | ICD-10-CM | POA: Diagnosis not present

## 2020-01-10 DIAGNOSIS — I10 Essential (primary) hypertension: Secondary | ICD-10-CM | POA: Diagnosis not present

## 2020-01-10 DIAGNOSIS — E1159 Type 2 diabetes mellitus with other circulatory complications: Secondary | ICD-10-CM | POA: Diagnosis not present

## 2020-01-10 DIAGNOSIS — Z471 Aftercare following joint replacement surgery: Secondary | ICD-10-CM | POA: Diagnosis not present

## 2020-01-10 DIAGNOSIS — E785 Hyperlipidemia, unspecified: Secondary | ICD-10-CM | POA: Diagnosis not present

## 2020-01-10 DIAGNOSIS — M170 Bilateral primary osteoarthritis of knee: Secondary | ICD-10-CM | POA: Diagnosis not present

## 2020-01-11 DIAGNOSIS — M1611 Unilateral primary osteoarthritis, right hip: Secondary | ICD-10-CM | POA: Diagnosis not present

## 2020-01-11 DIAGNOSIS — Z96641 Presence of right artificial hip joint: Secondary | ICD-10-CM | POA: Diagnosis not present

## 2020-01-13 DIAGNOSIS — G894 Chronic pain syndrome: Secondary | ICD-10-CM | POA: Diagnosis not present

## 2020-01-13 DIAGNOSIS — M797 Fibromyalgia: Secondary | ICD-10-CM | POA: Diagnosis not present

## 2020-01-14 DIAGNOSIS — Z96641 Presence of right artificial hip joint: Secondary | ICD-10-CM | POA: Diagnosis not present

## 2020-01-14 DIAGNOSIS — M170 Bilateral primary osteoarthritis of knee: Secondary | ICD-10-CM | POA: Diagnosis not present

## 2020-01-14 DIAGNOSIS — I10 Essential (primary) hypertension: Secondary | ICD-10-CM | POA: Diagnosis not present

## 2020-01-14 DIAGNOSIS — E785 Hyperlipidemia, unspecified: Secondary | ICD-10-CM | POA: Diagnosis not present

## 2020-01-14 DIAGNOSIS — Z471 Aftercare following joint replacement surgery: Secondary | ICD-10-CM | POA: Diagnosis not present

## 2020-01-14 DIAGNOSIS — E1159 Type 2 diabetes mellitus with other circulatory complications: Secondary | ICD-10-CM | POA: Diagnosis not present

## 2020-01-14 DIAGNOSIS — G894 Chronic pain syndrome: Secondary | ICD-10-CM | POA: Diagnosis not present

## 2020-01-14 DIAGNOSIS — M47817 Spondylosis without myelopathy or radiculopathy, lumbosacral region: Secondary | ICD-10-CM | POA: Diagnosis not present

## 2020-01-16 DIAGNOSIS — L0201 Cutaneous abscess of face: Secondary | ICD-10-CM | POA: Diagnosis not present

## 2020-01-16 DIAGNOSIS — M170 Bilateral primary osteoarthritis of knee: Secondary | ICD-10-CM | POA: Diagnosis not present

## 2020-01-16 DIAGNOSIS — Z471 Aftercare following joint replacement surgery: Secondary | ICD-10-CM | POA: Diagnosis not present

## 2020-01-16 DIAGNOSIS — M47817 Spondylosis without myelopathy or radiculopathy, lumbosacral region: Secondary | ICD-10-CM | POA: Diagnosis not present

## 2020-01-16 DIAGNOSIS — I10 Essential (primary) hypertension: Secondary | ICD-10-CM | POA: Diagnosis not present

## 2020-01-16 DIAGNOSIS — Z96641 Presence of right artificial hip joint: Secondary | ICD-10-CM | POA: Diagnosis not present

## 2020-01-16 DIAGNOSIS — E785 Hyperlipidemia, unspecified: Secondary | ICD-10-CM | POA: Diagnosis not present

## 2020-01-16 DIAGNOSIS — G894 Chronic pain syndrome: Secondary | ICD-10-CM | POA: Diagnosis not present

## 2020-01-16 DIAGNOSIS — E1159 Type 2 diabetes mellitus with other circulatory complications: Secondary | ICD-10-CM | POA: Diagnosis not present

## 2020-01-21 DIAGNOSIS — Z96641 Presence of right artificial hip joint: Secondary | ICD-10-CM | POA: Diagnosis not present

## 2020-01-21 DIAGNOSIS — Z471 Aftercare following joint replacement surgery: Secondary | ICD-10-CM | POA: Diagnosis not present

## 2020-01-21 DIAGNOSIS — E1159 Type 2 diabetes mellitus with other circulatory complications: Secondary | ICD-10-CM | POA: Diagnosis not present

## 2020-01-21 DIAGNOSIS — G894 Chronic pain syndrome: Secondary | ICD-10-CM | POA: Diagnosis not present

## 2020-01-21 DIAGNOSIS — M47817 Spondylosis without myelopathy or radiculopathy, lumbosacral region: Secondary | ICD-10-CM | POA: Diagnosis not present

## 2020-01-21 DIAGNOSIS — M170 Bilateral primary osteoarthritis of knee: Secondary | ICD-10-CM | POA: Diagnosis not present

## 2020-01-21 DIAGNOSIS — E785 Hyperlipidemia, unspecified: Secondary | ICD-10-CM | POA: Diagnosis not present

## 2020-01-21 DIAGNOSIS — I10 Essential (primary) hypertension: Secondary | ICD-10-CM | POA: Diagnosis not present

## 2020-01-22 ENCOUNTER — Ambulatory Visit: Payer: Medicare HMO | Admitting: Pain Medicine

## 2020-01-22 DIAGNOSIS — E669 Obesity, unspecified: Secondary | ICD-10-CM | POA: Diagnosis not present

## 2020-01-22 DIAGNOSIS — J4531 Mild persistent asthma with (acute) exacerbation: Secondary | ICD-10-CM | POA: Diagnosis not present

## 2020-01-22 DIAGNOSIS — B9562 Methicillin resistant Staphylococcus aureus infection as the cause of diseases classified elsewhere: Secondary | ICD-10-CM | POA: Diagnosis not present

## 2020-01-22 DIAGNOSIS — Z6841 Body Mass Index (BMI) 40.0 and over, adult: Secondary | ICD-10-CM | POA: Diagnosis not present

## 2020-01-22 DIAGNOSIS — L0201 Cutaneous abscess of face: Secondary | ICD-10-CM | POA: Diagnosis not present

## 2020-01-31 ENCOUNTER — Ambulatory Visit: Payer: Medicare HMO | Admitting: Pain Medicine

## 2020-02-01 ENCOUNTER — Other Ambulatory Visit: Payer: Self-pay

## 2020-02-01 NOTE — Patient Outreach (Signed)
[facility]) Care Management  [place]  02/01/2020   Karen Lewis [birth year] [id number]  Subjective: Telephone call to patient for disease management follow up. Patient reports that she is doing good. Patient reports having an abscess to her face that is now better but was MRSA positive.  She states she is using ointment to her nose and using Hibiclens to keep area clean. Patient also states she had a asthma flare for which she was placed on steroids. She reports that her sugars were in the 200-300 range but now are a lot better with last reading of 128.  Discussed importance of limiting carbohydrates and remaining active.  Patient continuing with PT but is thinking about outpatient water therapy and asked about information.  Advised patient that I would check into it and get back with her. She verbalized understanding and voices no other concerns.    Telephone call to [facility] PT at [phone].  They do have aquatic therapy but the patient has to be evaluated through them and that classes are at the [facility] on Fridays.    Telephone call back to patient advised her of the above. She will discuss with her physical therapist and go from there.     Objective:   Encounter Medications:  Outpatient Encounter Medications as of 02/01/2020  Medication Sig  . ACCU-CHEK AVIVA PLUS test strip   . acetaminophen (TYLENOL) 650 MG CR tablet Take 650-1,300 mg by mouth every 8 (eight) hours as needed for pain.  Marland Kitchen acidophilus (RISAQUAD) CAPS capsule Take 1 capsule by mouth daily.  Marland Kitchen albuterol (PROVENTIL HFA;VENTOLIN HFA) 108 (90 BASE) MCG/ACT inhaler Inhale 2 puffs into the lungs every 6 (six) hours as needed for wheezing or shortness of breath.  Marland Kitchen amitriptyline (ELAVIL) 100 MG tablet Take 100 mg by mouth at bedtime.  . Ascorbic Acid (VITAMIN C) 1000 MG tablet Take 1,000 mg by mouth daily.   . Biotin 10 MG CAPS Take 10 mg by mouth daily.   .  butalbital-acetaminophen-caffeine (FIORICET) 50-325-40 MG tablet Take 1 tablet by mouth every 6 (six) hours as needed for migraine.   . calcium carbonate (TUMS EX) 750 MG chewable tablet Chew 1 tablet by mouth daily.   . clonazePAM (KLONOPIN) 0.5 MG tablet Take 1 tablet (0.5 mg total) by mouth 3 (three) times daily as needed for anxiety.  . Cyanocobalamin (B-12) 2500 MCG TABS Take 2,500 mcg by mouth daily.  . diclofenac sodium (VOLTAREN) 1 % GEL Apply 2 g topically 4 (four) times daily.  . diphenhydrAMINE (BENADRYL) 25 MG tablet Take 25 mg by mouth every 8 (eight) hours as needed for itching or allergies.   Marland Kitchen EPINEPHrine 0.3 mg/0.3 mL IJ SOAJ injection Inject 0.3 mg into the muscle as needed for anaphylaxis.   Marland Kitchen ergocalciferol (VITAMIN D2) 1.25 MG (50000 UT) capsule Take 50,000 Units by mouth 2 (two) times a week.   . famotidine (PEPCID) 20 MG tablet Take 20 mg by mouth daily as needed for heartburn or indigestion.   Marland Kitchen glucose blood (ACCU-CHEK AVIVA PLUS) test strip Use 2 (two) times daily  . levocetirizine (XYZAL) 5 MG tablet Take 5 mg by mouth at bedtime.   Marland Kitchen levothyroxine (SYNTHROID) 125 MCG tablet Take 250 mcg by mouth daily before breakfast.   . lisinopril (ZESTRIL) 5 MG tablet Take 5 mg by mouth at bedtime.   . magnesium oxide (MAG-OX) 400 MG tablet Take 800-1,200 mg by mouth See admin instructions. Take 1200 mg  by mouth in the morning and 800 mg at bedtime  . meclizine (ANTIVERT) 25 MG tablet Take 25 mg by mouth 3 (three) times daily as needed for dizziness.   . medroxyPROGESTERone (DEPO-PROVERA) 150 MG/ML injection Inject 150 mg into the muscle every 3 (three) months.   . metFORMIN (GLUCOPHAGE) 1000 MG tablet Take 1,000 mg by mouth 2 (two) times daily with a meal.   . metoprolol tartrate (LOPRESSOR) 50 MG tablet Take 75 mg by mouth 2 (two) times daily.   . montelukast (SINGULAIR) 10 MG tablet Take 10 mg by mouth at bedtime.   . Multiple Vitamin (MULTI-VITAMIN) tablet Take 2 tablets by  mouth daily.  . mupirocin ointment (BACTROBAN) 2 % APPLY TO WOUND DAILY.  . naloxone (NARCAN) nasal spray 4 mg/0.1 mL Spray into one nostril. Repeat with second device into other nostril after 2-3 minutes if no or minimal response. (Patient taking differently: Place 0.4 mg into the nose once. Spray into one nostril. Repeat with second device into other nostril after 2-3 minutes if no or minimal response.)  . naphazoline-pheniramine (NAPHCON-A) 0.025-0.3 % ophthalmic solution Place 1 drop into both eyes 4 (four) times daily as needed for eye irritation.  Marland Kitchen omeprazole (PRILOSEC) 40 MG capsule Take 40 mg by mouth in the morning and at bedtime.  Marland Kitchen oxyCODONE (OXY IR/ROXICODONE) 5 MG immediate release tablet Take 1 tablet (5 mg total) by mouth 5 (five) times daily. Must last 30 days  . pregabalin (LYRICA) 225 MG capsule Take 1 capsule (225 mg total) by mouth 2 (two) times daily.  . QUEtiapine (SEROQUEL) 100 MG tablet Take 100 mg by mouth at bedtime.  . rosuvastatin (CRESTOR) 40 MG tablet Take 40 mg by mouth daily.   . sertraline (ZOLOFT) 100 MG tablet Take 200 mg by mouth daily.   Marland Kitchen spironolactone (ALDACTONE) 25 MG tablet Take 25 mg by mouth 2 (two) times daily.   . SUPER B COMPLEX/C PO Take 1 tablet by mouth at bedtime.   Marland Kitchen tiZANidine (ZANAFLEX) 4 MG tablet Take 1 tablet (4 mg total) by mouth 2 (two) times daily as needed for muscle spasms.  Marland Kitchen triamcinolone (NASACORT AQ) 55 MCG/ACT AERO nasal inhaler Place 2 sprays into the nose 2 (two) times daily as needed (allergies).   . zolpidem (AMBIEN) 10 MG tablet Take 10 mg by mouth at bedtime as needed for sleep.  Marland Kitchen zonisamide (ZONEGRAN) 50 MG capsule Take 150 mg by mouth at bedtime.    No facility-administered encounter medications on file as of 02/01/2020.    Functional Status:  In your present state of health, do you have any difficulty performing the following activities: 12/14/2019 12/13/2019  Hearing? N N  Vision? N N  Difficulty concentrating or making  decisions? N N  Comment - -  Walking or climbing stairs? Y Y  Comment Right Hip Replacement. recent right hip replacement  Dressing or bathing? Y Y  Comment Right Hip Replacement. recent right hip replacement  Doing errands, shopping? Y Y  Comment Right Hip Replacement. recent hip replacement- fiance helps  Preparing Food and eating ? Y Y  Comment Right Hip Replacement. fiance helps  Using the Toilet? Y (No Data)  Comment Right Hip Replacement. fiance helps  In the past six months, have you accidently leaked urine? N N  Do you have problems with loss of bowel control? N N  Managing your Medications? N N  Managing your Finances? N N  Housekeeping or managing your Housekeeping? Tempie Donning  Comment Right Hip Replacement. fiance helps  Some recent data might be hidden    Fall/Depression Screening: Fall Risk  12/14/2019 12/13/2019 08/23/2018  Falls in the past year? 0 0 0  Number falls in past yr: 0 - -  Comment - - -  Injury with Fall? 0 - -  Comment - - -  Risk Factor Category  - - -  Risk for fall due to : No Fall Risks - -  Risk for fall due to: Comment - - -  Follow up Education provided;Falls prevention discussed - -   PHQ 2/9 Scores 12/14/2019 12/13/2019 04/27/2018 04/13/2018 03/30/2018 03/27/2018 03/07/2018  PHQ - 2 Score 4 4 0 0 0 0 0  PHQ- 9 Score 12 12 - - - - -  Exception Documentation - - - - - - -    Assessment: Patient continuing to manage post hip replacement.  Patient continuing to manage diabetes and benefits from disease management support.    SDOH Screenings   Alcohol Screen: Low Risk   . Last Alcohol Screening Score (AUDIT): 0  Depression (PHQ2-9): Medium Risk  . PHQ-2 Score: 12  Financial Resource Strain: Low Risk   . Difficulty of Paying Living Expenses: Not hard at all  Food Insecurity: No Food Insecurity  . Worried About Charity fundraiser in the Last Year: Never true  . Ran Out of Food in the Last Year: Never true  Housing: Low Risk   . Last Housing Risk Score: 0   Physical Activity: Inactive  . Days of Exercise per Week: 0 days  . Minutes of Exercise per Session: 0 min  Social Connections: Unknown  . Frequency of Communication with Friends and Family: More than three times a week  . Frequency of Social Gatherings with Friends and Family: More than three times a week  . Attends Religious Services: More than 4 times per year  . Active Member of Clubs or Organizations: No  . Attends Archivist Meetings: Never  . Marital Status: Not on file  Stress: Stress Concern Present  . Feeling of Stress : Rather much  Tobacco Use: Low Risk   . Smoking Tobacco Use: Never Smoker  . Smokeless Tobacco Use: Never Used  Transportation Needs: No Transportation Needs  . Lack of Transportation (Medical): No  . Lack of Transportation (Non-Medical): No    Plan:  [facility] CM Care Plan Problem One     Most Recent Value  Care Plan Problem One  Recent Hip Replacement  Role Documenting the Problem One  Care Management Telephonic Coordinator  Care Plan for Problem One  Active  [facility] Long Term Goal   Patient will not experience unplanned hospital readmission within the next 31 days  [facility]N Long Term Goal Start Date  12/13/19  [facility] Long Term Goal Met Date  02/01/20  [facility] CM Short Term Goal #2   Patient will keep A1c less than 7.0 within 30 days.  [facility]N CM Short Term Goal #2 Start Date  02/01/20  Interventions for Short Term Goal #2  Reviewed blood sugars with patient.  Patient watching carbohydrates and exercising with physical therapy.      RN CM will contact in the month of June and patient agreeable.    Jone Baseman, RN, MSN [place] [facility] Care Management Coordinator Direct Line [phone] Cell [phone] Toll Free: [phone]  Fax: [phone]

## 2020-02-04 DIAGNOSIS — M170 Bilateral primary osteoarthritis of knee: Secondary | ICD-10-CM | POA: Diagnosis not present

## 2020-02-04 DIAGNOSIS — I10 Essential (primary) hypertension: Secondary | ICD-10-CM | POA: Diagnosis not present

## 2020-02-04 DIAGNOSIS — Z471 Aftercare following joint replacement surgery: Secondary | ICD-10-CM | POA: Diagnosis not present

## 2020-02-04 DIAGNOSIS — M47817 Spondylosis without myelopathy or radiculopathy, lumbosacral region: Secondary | ICD-10-CM | POA: Diagnosis not present

## 2020-02-04 DIAGNOSIS — E785 Hyperlipidemia, unspecified: Secondary | ICD-10-CM | POA: Diagnosis not present

## 2020-02-04 DIAGNOSIS — Z96641 Presence of right artificial hip joint: Secondary | ICD-10-CM | POA: Diagnosis not present

## 2020-02-04 DIAGNOSIS — E1159 Type 2 diabetes mellitus with other circulatory complications: Secondary | ICD-10-CM | POA: Diagnosis not present

## 2020-02-04 DIAGNOSIS — G894 Chronic pain syndrome: Secondary | ICD-10-CM | POA: Diagnosis not present

## 2020-02-12 DIAGNOSIS — E1159 Type 2 diabetes mellitus with other circulatory complications: Secondary | ICD-10-CM | POA: Diagnosis not present

## 2020-02-12 DIAGNOSIS — I152 Hypertension secondary to endocrine disorders: Secondary | ICD-10-CM | POA: Diagnosis not present

## 2020-02-13 DIAGNOSIS — M797 Fibromyalgia: Secondary | ICD-10-CM | POA: Diagnosis not present

## 2020-02-13 DIAGNOSIS — G894 Chronic pain syndrome: Secondary | ICD-10-CM | POA: Diagnosis not present

## 2020-02-14 DIAGNOSIS — H532 Diplopia: Secondary | ICD-10-CM | POA: Diagnosis not present

## 2020-02-14 DIAGNOSIS — G932 Benign intracranial hypertension: Secondary | ICD-10-CM | POA: Diagnosis not present

## 2020-02-15 DIAGNOSIS — Z96641 Presence of right artificial hip joint: Secondary | ICD-10-CM | POA: Diagnosis not present

## 2020-02-15 DIAGNOSIS — M170 Bilateral primary osteoarthritis of knee: Secondary | ICD-10-CM | POA: Diagnosis not present

## 2020-02-15 DIAGNOSIS — Z471 Aftercare following joint replacement surgery: Secondary | ICD-10-CM | POA: Diagnosis not present

## 2020-02-15 DIAGNOSIS — E785 Hyperlipidemia, unspecified: Secondary | ICD-10-CM | POA: Diagnosis not present

## 2020-02-15 DIAGNOSIS — E1159 Type 2 diabetes mellitus with other circulatory complications: Secondary | ICD-10-CM | POA: Diagnosis not present

## 2020-02-15 DIAGNOSIS — M47817 Spondylosis without myelopathy or radiculopathy, lumbosacral region: Secondary | ICD-10-CM | POA: Diagnosis not present

## 2020-02-15 DIAGNOSIS — G894 Chronic pain syndrome: Secondary | ICD-10-CM | POA: Diagnosis not present

## 2020-02-15 DIAGNOSIS — I10 Essential (primary) hypertension: Secondary | ICD-10-CM | POA: Diagnosis not present

## 2020-02-19 ENCOUNTER — Ambulatory Visit
Admission: RE | Admit: 2020-02-19 | Discharge: 2020-02-19 | Disposition: A | Discharge status: Home / Self Care | Payer: Medicare HMO | Source: Ambulatory Visit | Attending: Pain Medicine | Admitting: Pain Medicine

## 2020-02-19 ENCOUNTER — Ambulatory Visit (INDEPENDENT_AMBULATORY_CARE_PROVIDER_SITE_OTHER): Payer: Medicare HMO | Admitting: Nurse Practitioner

## 2020-02-19 ENCOUNTER — Encounter: Payer: Self-pay | Admitting: Pain Medicine

## 2020-02-19 ENCOUNTER — Other Ambulatory Visit: Payer: Self-pay

## 2020-02-19 ENCOUNTER — Encounter (INDEPENDENT_AMBULATORY_CARE_PROVIDER_SITE_OTHER): Payer: Self-pay | Admitting: Nurse Practitioner

## 2020-02-19 ENCOUNTER — Ambulatory Visit (HOSPITAL_BASED_OUTPATIENT_CLINIC_OR_DEPARTMENT_OTHER): Payer: Medicare HMO | Admitting: Pain Medicine

## 2020-02-19 VITALS — BP 149/82 | HR 104 | Resp 16 | Wt 376.0 lb

## 2020-02-19 VITALS — BP 132/80 | HR 83 | Temp 97.4°F | Resp 20 | Ht 64.0 in | Wt 367.0 lb

## 2020-02-19 DIAGNOSIS — Z96641 Presence of right artificial hip joint: Secondary | ICD-10-CM | POA: Insufficient documentation

## 2020-02-19 DIAGNOSIS — M797 Fibromyalgia: Secondary | ICD-10-CM

## 2020-02-19 DIAGNOSIS — M174 Other bilateral secondary osteoarthritis of knee: Secondary | ICD-10-CM | POA: Insufficient documentation

## 2020-02-19 DIAGNOSIS — G8929 Other chronic pain: Secondary | ICD-10-CM | POA: Insufficient documentation

## 2020-02-19 DIAGNOSIS — G894 Chronic pain syndrome: Secondary | ICD-10-CM

## 2020-02-19 DIAGNOSIS — Z79899 Other long term (current) drug therapy: Secondary | ICD-10-CM | POA: Insufficient documentation

## 2020-02-19 DIAGNOSIS — M25561 Pain in right knee: Secondary | ICD-10-CM

## 2020-02-19 DIAGNOSIS — Z888 Allergy status to other drugs, medicaments and biological substances status: Secondary | ICD-10-CM | POA: Diagnosis present

## 2020-02-19 DIAGNOSIS — M25562 Pain in left knee: Secondary | ICD-10-CM

## 2020-02-19 DIAGNOSIS — Z91013 Allergy to seafood: Secondary | ICD-10-CM

## 2020-02-19 DIAGNOSIS — M792 Neuralgia and neuritis, unspecified: Secondary | ICD-10-CM | POA: Insufficient documentation

## 2020-02-19 DIAGNOSIS — E118 Type 2 diabetes mellitus with unspecified complications: Secondary | ICD-10-CM | POA: Diagnosis not present

## 2020-02-19 DIAGNOSIS — I890 Lymphedema, not elsewhere classified: Secondary | ICD-10-CM

## 2020-02-19 DIAGNOSIS — Z6841 Body Mass Index (BMI) 40.0 and over, adult: Secondary | ICD-10-CM | POA: Insufficient documentation

## 2020-02-19 MED ORDER — FENTANYL CITRATE (PF) 100 MCG/2ML IJ SOLN
25.0000 ug | INTRAMUSCULAR | Status: DC | PRN
  Administered 2020-02-19: 50 ug via INTRAVENOUS

## 2020-02-19 MED ORDER — MIDAZOLAM HCL 5 MG/5ML IJ SOLN
1.0000 mg | INTRAMUSCULAR | Status: DC | PRN
  Administered 2020-02-19: 2 mg via INTRAVENOUS

## 2020-02-19 MED ORDER — ROPIVACAINE HCL 2 MG/ML IJ SOLN
5.0000 mL | Freq: Once | INTRAMUSCULAR | Status: AC
  Administered 2020-02-19: 5 mL via INTRA_ARTICULAR
  Filled 2020-02-19: qty 10

## 2020-02-19 MED ORDER — OXYCODONE HCL 5 MG PO TABS: 5.0000 mg | ORAL_TABLET | Freq: Every day | ORAL | 0 refills | Status: DC

## 2020-02-19 MED ORDER — CEFAZOLIN SODIUM 1 G IJ SOLR
INTRAMUSCULAR | Status: AC
  Filled 2020-02-19: qty 10

## 2020-02-19 MED ORDER — FENTANYL CITRATE (PF) 100 MCG/2ML IJ SOLN
INTRAMUSCULAR | Status: AC
  Filled 2020-02-19: qty 2

## 2020-02-19 MED ORDER — PREGABALIN 225 MG PO CAPS: 225.0000 mg | ORAL_CAPSULE | Freq: Two times a day (BID) | ORAL | 5 refills | Status: DC

## 2020-02-19 MED ORDER — SODIUM HYALURONATE (VISCOSUP) 20 MG/2ML IX SOSY: 2.0000 mL | PREFILLED_SYRINGE | Freq: Once | INTRA_ARTICULAR | Status: DC

## 2020-02-19 MED ORDER — MIDAZOLAM HCL 5 MG/5ML IJ SOLN
INTRAMUSCULAR | Status: AC
  Filled 2020-02-19: qty 5

## 2020-02-19 MED ORDER — LACTATED RINGERS IV SOLN: 1000.0000 mL | Freq: Once | INTRAVENOUS | Status: DC

## 2020-02-19 MED ORDER — LIDOCAINE HCL (PF) 1 % IJ SOLN
5.0000 mL | Freq: Once | INTRAMUSCULAR | Status: AC
  Administered 2020-02-19: 5 mL
  Filled 2020-02-19: qty 5

## 2020-02-19 MED ORDER — CEFAZOLIN SODIUM-DEXTROSE 1-4 GM/50ML-% IV SOLN
1.0000 g | Freq: Once | INTRAVENOUS | Status: AC
  Administered 2020-02-19: 1 g via INTRAVENOUS

## 2020-02-19 MED ORDER — LIDOCAINE HCL 2 % IJ SOLN
20.0000 mL | Freq: Once | INTRAMUSCULAR | Status: AC
  Administered 2020-02-19: 400 mg
  Filled 2020-02-19: qty 40

## 2020-02-19 NOTE — Patient Instructions (Addendum)
____________________________________________________________________________________________  Post-Procedure Discharge Instructions  Instructions:  Apply ice:   Purpose: This will minimize any swelling and discomfort after procedure.   When: Day of procedure, as soon as you get home.  How: Fill a plastic sandwich bag with crushed ice. Cover it with a small towel and apply to injection site.  How long: (15 min on, 15 min off) Apply for 15 minutes then remove x 15 minutes.  Repeat sequence on day of procedure, until you go to bed.  Apply heat:   Purpose: To treat any soreness and discomfort from the procedure.  When: Starting the next day after the procedure.  How: Apply heat to procedure site starting the day following the procedure.  How long: May continue to repeat daily, until discomfort goes away.  Food intake: Start with clear liquids (like water) and advance to regular food, as tolerated.   Physical activities: Keep activities to a minimum for the first 8 hours after the procedure. After that, then as tolerated.  Driving: If you have received any sedation, be responsible and do not drive. You are not allowed to drive for 24 hours after having sedation.  Blood thinner: (Applies only to those taking blood thinners) You may restart your blood thinner 6 hours after your procedure.  Insulin: (Applies only to Diabetic patients taking insulin) As soon as you can eat, you may resume your normal dosing schedule.  Infection prevention: Keep procedure site clean and dry. Shower daily and clean area with soap and water.  Post-procedure Pain Diary: Extremely important that this be done correctly and accurately. Recorded information will be used to determine the next step in treatment. For the purpose of accuracy, follow these rules:  Evaluate only the area treated. Do not report or include pain from an untreated area. For the purpose of this evaluation, ignore all other areas of pain,  except for the treated area.  After your procedure, avoid taking a long nap and attempting to complete the pain diary after you wake up. Instead, set your alarm clock to go off every hour, on the hour, for the initial 8 hours after the procedure. Document the duration of the numbing medicine, and the relief you are getting from it.  Do not go to sleep and attempt to complete it later. It will not be accurate. If you received sedation, it is likely that you were given a medication that may cause amnesia. Because of this, completing the diary at a later time may cause the information to be inaccurate. This information is needed to plan your care.  Follow-up appointment: Keep your post-procedure follow-up evaluation appointment after the procedure (usually 2 weeks for most procedures, 6 weeks for radiofrequencies). DO NOT FORGET to bring you pain diary with you.   Expect: (What should I expect to see with my procedure?)  From numbing medicine (AKA: Local Anesthetics): Numbness or decrease in pain. You may also experience some weakness, which if present, could last for the duration of the local anesthetic.  Onset: Full effect within 15 minutes of injected.  Duration: It will depend on the type of local anesthetic used. On the average, 1 to 8 hours.   From steroids (Applies only if steroids were used): Decrease in swelling or inflammation. Once inflammation is improved, relief of the pain will follow.  Onset of benefits: Depends on the amount of swelling present. The more swelling, the longer it will take for the benefits to be seen. In some cases, up to 10 days.    Duration: Steroids will stay in the system x 2 weeks. Duration of benefits will depend on multiple posibilities including persistent irritating factors.  Side-effects: If present, they may typically last 2 weeks (the duration of the steroids).  Frequent: Cramps (if they occur, drink Gatorade and take over-the-counter Magnesium 450-500 mg  once to twice a day); water retention with temporary weight gain; increases in blood sugar; decreased immune system response; increased appetite.  Occasional: Facial flushing (red, warm cheeks); mood swings; menstrual changes.  Uncommon: Long-term decrease or suppression of natural hormones; bone thinning. (These are more common with higher doses or more frequent use. This is why we prefer that our patients avoid having any injection therapies in other practices.)   Very Rare: Severe mood changes; psychosis; aseptic necrosis.  From procedure: Some discomfort is to be expected once the numbing medicine wears off. This should be minimal if ice and heat are applied as instructed.  Call if: (When should I call?)  You experience numbness and weakness that gets worse with time, as opposed to wearing off.  New onset bowel or bladder incontinence. (Applies only to procedures done in the spine)  Emergency Numbers:  Durning business hours (Monday - Thursday, 8:00 AM - 4:00 PM) (Friday, 9:00 AM - 12:00 Noon): [phone]  After hours: [phone]  NOTE: If you are having a problem and are unable connect with, or to talk to a provider, then go to your nearest urgent care or emergency department. If the problem is serious and urgent, please call 911. ____________________________________________________________________________________________   ____________________________________________________________________________________________  Preparing for your procedure (without sedation)  Procedure appointments are limited to planned procedures: . No Prescription Refills. . No disability issues will be discussed. . No medication changes will be discussed.  Instructions: . Oral Intake: Do not eat or drink anything for at least 6 hours prior to your procedure. (Exception: Blood Pressure Medication. See below.) . Transportation: Unless otherwise stated by your physician, you may drive yourself  after the procedure. . Blood Pressure Medicine: Do not forget to take your blood pressure medicine with a sip of water the morning of the procedure. If your Diastolic (lower reading)is above 100 mmHg, elective cases will be cancelled/rescheduled. . Blood thinners: These will need to be stopped for procedures. Notify our staff if you are taking any blood thinners. Depending on which one you take, there will be specific instructions on how and when to stop it. . Diabetics on insulin: Notify the staff so that you can be scheduled 1st case in the morning. If your diabetes requires high dose insulin, take only  of your normal insulin dose the morning of the procedure and notify the staff that you have done so. . Preventing infections: Shower with an antibacterial soap the morning of your procedure.  . Build-up your immune system: Take 1000 mg of Vitamin C with every meal (3 times a day) the day prior to your procedure. . Antibiotics: Inform the staff if you have a condition or reason that requires you to take antibiotics before dental procedures. . Pregnancy: If you are pregnant, call and cancel the procedure. . Sickness: If you have a cold, fever, or any active infections, call and cancel the procedure. . Arrival: You must be in the facility at least 30 minutes prior to your scheduled procedure. . Children: Do not bring any children with you. . Dress appropriately: Bring dark clothing that you would not mind if they get stained. . Valuables: Do not bring any jewelry   or valuables.  Reasons to call and reschedule or cancel your procedure: (Following these recommendations will minimize the risk of a serious complication.) . Surgeries: Avoid having procedures within 2 weeks of any surgery. (Avoid for 2 weeks before or after any surgery). . Flu Shots: Avoid having procedures within 2 weeks of a flu shots or . (Avoid for 2 weeks before or after immunizations). . Barium: Avoid having a procedure within 7-10  days after having had a radiological study involving the use of radiological contrast. (Myelograms, Barium swallow or enema study). . Heart attacks: Avoid any elective procedures or surgeries for the initial 6 months after a "Myocardial Infarction" (Heart Attack). . Blood thinners: It is imperative that you stop these medications before procedures. Let us know if you if you take any blood thinner.  . Infection: Avoid procedures during or within two weeks of an infection (including chest colds or gastrointestinal problems). Symptoms associated with infections include: Localized redness, fever, chills, night sweats or profuse sweating, burning sensation when voiding, cough, congestion, stuffiness, runny nose, sore throat, diarrhea, nausea, vomiting, cold or Flu symptoms, recent or current infections. It is specially important if the infection is over the area that we intend to treat. Marland Kitchen Heart and lung problems: Symptoms that may suggest an active cardiopulmonary problem include: cough, chest pain, breathing difficulties or shortness of breath, dizziness, ankle swelling, uncontrolled high or unusually low blood pressure, and/or palpitations. If you are experiencing any of these symptoms, cancel your procedure and contact your primary care physician for an evaluation.  Remember:  Regular Business hours are:  Monday to Thursday 8:00 AM to 4:00 PM  Provider's Schedule: Milinda Pointer, MD:  Procedure days: Tuesday and Thursday 7:30 AM to 4:00 PM  Gillis Santa, MD:  Procedure days: Monday and Wednesday 7:30 AM to 4:00 PM ____________________________________________________________________________________________   Pain Management Discharge Instructions  General Discharge Instructions :  If you need to reach your doctor call: Monday-Friday 8:00 am - 4:00 pm at [phone] or toll free [phone].  After clinic hours [phone] to have operator reach doctor.  Bring all of your medication  bottles to all your appointments in the pain clinic.  To cancel or reschedule your appointment with Pain Management please remember to call 24 hours in advance to avoid a fee.  Refer to the educational materials which you have been given on: General Risks, I had my Procedure. Discharge Instructions, Post Sedation.  Post Procedure Instructions:  The drugs you were given will stay in your system until tomorrow, so for the next 24 hours you should not drive, make any legal decisions or drink any alcoholic beverages.  You may eat anything you prefer, but it is better to start with liquids then soups and crackers, and gradually work up to solid foods.  Please notify your doctor immediately if you have any unusual bleeding, trouble breathing or pain that is not related to your normal pain.  Depending on the type of procedure that was done, some parts of your body may feel week and/or numb.  This usually clears up by tonight or the next day.  Walk with the use of an assistive device or accompanied by an adult for the 24 hours.  You may use ice on the affected area for the first 24 hours.  Put ice in a Ziploc bag and cover with a towel and place against area 15 minutes on 15 minutes off.  You may switch to heat after 24 hours.

## 2020-02-19 NOTE — Progress Notes (Signed)
PROVIDER NOTE: Information contained herein reflects review and annotations entered in association with encounter. Interpretation of such information and data should be left to medically-trained personnel. Information provided to patient can be located elsewhere in the medical record under "Patient Instructions". Document created using STT-dictation technology, any transcriptional errors that may result from process are unintentional.    Patient: Karen Lewis  Service Category: Procedure  Provider: Gaspar Cola, MD  DOB: Jan 02, 1971  DOS: 02/19/2020  Location: [facility]  MRN: [id number]  Setting: Ambulatory - outpatient  Referring Provider: Milinda Pointer, MD  Type: Established Patient  Specialty: Interventional Pain Management  PCP: Karen Crouch, MD   Primary Reason for Visit: Interventional Pain Management Treatment. CC: Knee Pain (bilateral)  Procedure:          Anesthesia, Analgesia, Anxiolysis:  Type: Palliative Intra-Articular Hyalgan Knee Injection #1  Region: Lateral infrapatellar Knee Region Level: Knee Joint Laterality: Bilateral  Type: Moderate (Conscious) Sedation combined with Local Anesthesia Indication(s): Analgesia and Anxiety Local Anesthetic: Lidocaine 1-2% Route: Intravenous (IV) IV Access: Secured Sedation: Meaningful verbal contact was maintained at all times during the procedure   Position: Supine w/ knee bent 20 to 30 degrees   Indications: 1. Chronic knee pain (Primary Area of Pain) (Bilateral) (R>L)   2. Secondary Osteoarthritis of knee (Bilateral) (R>L)   3. Morbid obesity with body mass index (BMI) greater than or equal to 70 in adult [facility])    H/O allergy to shellfish    Allergy to iodine    Pain Score: Pre-procedure: 7 /10 Post-procedure: 1 /10   Pharmacotherapy Assessment  Analgesic: Oxycodone IR 5 mg 1 tab PO 5X/day (#150/mo) (25 mg/day) MME/day:37.5 mg/day.   Monitoring: [place] PMP: PDMP reviewed during this encounter.        Pharmacotherapy: No side-effects or adverse reactions reported. Compliance: No problems identified. Effectiveness: Clinically acceptable.  UDS:  Summary  Date Value Ref Range Status  08/23/2018 FINAL  Final    Comment:    ==================================================================== TOXASSURE SELECT 13 (MW) ==================================================================== Test                             Result       Flag       Units Drug Present and Declared for Prescription Verification   Oxycodone                      260          EXPECTED   ng/mg creat   Oxymorphone                    52           EXPECTED   ng/mg creat   Noroxycodone                   991          EXPECTED   ng/mg creat    Sources of oxycodone include scheduled prescription medications.    Oxymorphone and noroxycodone are expected metabolites of    oxycodone. Oxymorphone is also available as a scheduled    prescription medication. Drug Absent but Declared for Prescription Verification   Codeine                        Not Detected UNEXPECTED ng/mg creat   Butalbital  Not Detected UNEXPECTED ==================================================================== Test                      Result    Flag   Units      Ref Range   Creatinine              203              mg/dL      >=20 ==================================================================== Declared Medications:  The flagging and interpretation on this report are based on the  following declared medications.  Unexpected results may arise from  inaccuracies in the declared medications.  **Note: The testing scope of this panel includes these medications:  Butalbital (Butalbital/APAP/Caffeine)  Codeine  Oxycodone  **Note: The testing scope of this panel does not include following  reported medications:  Acetaminophen  Acetaminophen (Butalbital/APAP/Caffeine)  Acyclovir  Albuterol  Albuterol (Ipratropium-Albuterol)   Amitriptyline  Caffeine (Butalbital/APAP/Caffeine)  Clotrimazole  Cyanocobalamin  Diphenhydramine  Epinephrine  Estradiol  Famotidine  Fluconazole  Gabapentin  Guaifenesin  Ipratropium (Ipratropium-Albuterol)  Iron (Ferrous Sulfate)  Ketoconazole  Ketorolac  Levocetirizine  Levofloxacin  Levothyroxine  Magnesium  Magnesium Oxide  Meclizine  Medroxyprogesterone (Depo-Provera)  Metformin  Metoprolol  Montelukast  Naloxone  Nitrofurantoin (Macrobid)  Omeprazole  Ondansetron  Phentermine  Pioglitazone  Quetiapine  Ranitidine  Rosuvastatin  Semaglutide (Ozempic)  Sertraline  Spironolactone  Tizanidine  Triamcinolone  Vitamin B (Biotin)  Vitamin C  Vitamin D2 (Ergocalciferol)  Zolpidem  Zonisamide (Zonegran) ==================================================================== For clinical consultation, please call [phone]. ====================================================================      Pre-op Assessment:  Karen Lewis is a 49 y.o. (year old), female patient, seen today for interventional treatment. She  has a past surgical history that includes Laparoscopic partial gastrectomy; Shoulder arthroscopy (Right); Joint replacement (Left, hip); Carpal tunnel release (Bilateral); Diagnostic laparoscopy; Cholecystectomy; Trigger finger release (Right); Thyroidectomy (N/A, 11/12/2015); left trigger finger; Roux-en-Y Gastric Bypass (06/03/2017); Hiatal hernia repair; peniculectomy (N/A, 07/05/2018); Appendectomy; and Total hip arthroplasty (Right, 11/27/2019). Karen Lewis has a current medication list which includes the following prescription(s): acetaminophen, acidophilus, albuterol, amitriptyline, vitamin c, biotin, butalbital-acetaminophen-caffeine, calcium carbonate, clonazepam, b-12, diclofenac sodium, diphenhydramine, epinephrine, ergocalciferol, famotidine, accu-chek aviva plus, levocetirizine, levothyroxine, lisinopril, magnesium oxide, meclizine, medroxyprogesterone,  metformin, metoprolol tartrate, montelukast, multi-vitamin, mupirocin ointment, naloxone, naphazoline-pheniramine, omeprazole, [START ON 03/17/2020] oxycodone, [START ON 04/16/2020] oxycodone, [START ON 05/16/2020] oxycodone, [START ON 03/05/2020] pregabalin, quetiapine, rosuvastatin, sertraline, spironolactone, b complex-c, tizanidine, triamcinolone, zolpidem, zonisamide, and accu-chek aviva plus, and the following Facility-Administered Medications: fentanyl, lactated ringers, midazolam, sodium hyaluronate, and sodium hyaluronate. Her primarily concern today is the Knee Pain (bilateral)  Initial Vital Signs:  Pulse/HCG Rate: 83ECG Heart Rate: 79 Temp: (!) 97.3 F (36.3 C) Resp: 20 BP: 113/89 SpO2: 98 %  BMI: Estimated body mass index is 63 kg/m as calculated from the following:   Height as of this encounter: 5\' 4"  (1.626 m).   Weight as of this encounter: 367 lb (166.5 kg).  Risk Assessment: Allergies: Reviewed. She is allergic to bactrim [sulfamethoxazole-trimethoprim]; omalizumab; ciprofloxacin; shellfish allergy; and nsaids.  Allergy Precautions: None required Coagulopathies: Reviewed. None identified.  Blood-thinner therapy: None at this time Active Infection(s): Reviewed. None identified. Ms. Osorto is afebrile  Site Confirmation: Ms. Jahnke was asked to confirm the procedure and laterality before marking the site Procedure checklist: Completed Consent: Before the procedure and under the influence of no sedative(s), amnesic(s), or anxiolytics, the patient was informed of the treatment options, risks and possible complications. To fulfill our ethical and legal  obligations, as recommended by the American Medical Association's Code of Ethics, I have informed the patient of my clinical impression; the nature and purpose of the treatment or procedure; the risks, benefits, and possible complications of the intervention; the alternatives, including doing nothing; the risk(s) and benefit(s) of the  alternative treatment(s) or procedure(s); and the risk(s) and benefit(s) of doing nothing. The patient was provided information about the general risks and possible complications associated with the procedure. These may include, but are not limited to: failure to achieve desired goals, infection, bleeding, organ or nerve damage, allergic reactions, paralysis, and death. In addition, the patient was informed of those risks and complications associated to the procedure, such as failure to decrease pain; infection; bleeding; organ or nerve damage with subsequent damage to sensory, motor, and/or autonomic systems, resulting in permanent pain, numbness, and/or weakness of one or several areas of the body; allergic reactions; (i.e.: anaphylactic reaction); and/or death. Furthermore, the patient was informed of those risks and complications associated with the medications. These include, but are not limited to: allergic reactions (i.e.: anaphylactic or anaphylactoid reaction(s)); adrenal axis suppression; blood sugar elevation that in diabetics may result in ketoacidosis or comma; water retention that in patients with history of congestive heart failure may result in shortness of breath, pulmonary edema, and decompensation with resultant heart failure; weight gain; swelling or edema; medication-induced neural toxicity; particulate matter embolism and blood vessel occlusion with resultant organ, and/or nervous system infarction; and/or aseptic necrosis of one or more joints. Finally, the patient was informed that Medicine is not an exact science; therefore, there is also the possibility of unforeseen or unpredictable risks and/or possible complications that may result in a catastrophic outcome. The patient indicated having understood very clearly. We have given the patient no guarantees and we have made no promises. Enough time was given to the patient to ask questions, all of which were answered to the patient's  satisfaction. Ms. Caracci has indicated that she wanted to continue with the procedure. Attestation: I, the ordering provider, attest that I have discussed with the patient the benefits, risks, side-effects, alternatives, likelihood of achieving goals, and potential problems during recovery for the procedure that I have provided informed consent. Date  Time: 02/19/2020  9:32 AM  Pre-Procedure Preparation:  Monitoring: As per clinic protocol. Respiration, ETCO2, SpO2, BP, heart rate and rhythm monitor placed and checked for adequate function Safety Precautions: Patient was assessed for positional comfort and pressure points before starting the procedure. Time-out: I initiated and conducted the "Time-out" before starting the procedure, as per protocol. The patient was asked to participate by confirming the accuracy of the "Time Out" information. Verification of the correct person, site, and procedure were performed and confirmed by me, the nursing staff, and the patient. "Time-out" conducted as per Joint Commission's Universal Protocol (UP.01.01.01). Time: 1002  Description of Procedure:          Target Area: Knee Joint Approach: Just above the Lateral tibial plateau, lateral to the infrapatellar tendon. Area Prepped: Entire knee area, from the mid-thigh to the mid-shin. DuraPrep (Iodine Povacrylex [0.7% available iodine] and Isopropyl Alcohol, 74% w/w) Safety Precautions: Aspiration looking for blood return was conducted prior to all injections. At no point did we inject any substances, as a needle was being advanced. No attempts were made at seeking any paresthesias. Safe injection practices and needle disposal techniques used. Medications properly checked for expiration dates. SDV (single dose vial) medications used. Description of the Procedure: Protocol guidelines were followed. The  patient was placed in position over the fluoroscopy table. The target area was identified and the area prepped in the  usual manner. Skin & deeper tissues infiltrated with local anesthetic. Appropriate amount of time allowed to pass for local anesthetics to take effect. The procedure needles were then advanced to the target area. Proper needle placement secured. Negative aspiration confirmed. Solution injected in intermittent fashion, asking for systemic symptoms every 0.5cc of injectate. The needles were then removed and the area cleansed, making sure to leave some of the prepping solution back to take advantage of its long term bactericidal properties. Vitals:   02/19/20 1010 02/19/20 1020 02/19/20 1030 02/19/20 1040  BP: 128/74 131/88 138/79 132/80  Pulse:      Resp: 16 19 (!) 22 20  Temp:  97.6 F (36.4 C)  (!) 97.4 F (36.3 C)  SpO2: 97% 99% 99% 99%  Weight:      Height:        Start Time: 1002 hrs. End Time: 1010 hrs. Materials:  Needle(s) Type: Regular needle Gauge: 22G Length: 3.5-in Medication(s): Please see orders for medications and dosing details.  Imaging Guidance:          Type of Imaging Technique: Fluoroscopy Guidance (Non-spinal) Indication(s): Morbid obesity. Assistance in needle guidance and placement for procedures requiring needle placement in or near specific anatomical locations impossible to access without such assistance. Exposure Time: Please see nurses notes. Contrast: None used. Fluoroscopic Guidance: I was personally present during the use of fluoroscopy. "Tunnel Vision Technique" used to obtain the best possible view of the target area. Parallax error corrected before commencing the procedure. "Direction-depth-direction" technique used to introduce the needle under continuous pulsed fluoroscopy. Once target was reached, antero-posterior, oblique, and lateral fluoroscopic projection used confirm needle placement in all planes. Images permanently stored in EMR. Ultrasound Guidance: N/A Interpretation: No contrast injected. I personally interpreted the imaging intraoperatively.  Adequate needle placement confirmed in multiple planes. Permanent images saved into the patient's record.  Antibiotic Prophylaxis:   Anti-infectives (From admission, onward)   Start     Dose/Rate Route Frequency Ordered Stop   02/19/20 1000  ceFAZolin (ANCEF) IVPB 1 g/50 mL premix     1 g 100 mL/hr over 30 Minutes Intravenous  Once 02/19/20 0946 02/19/20 1034     Indication(s): Orthopedic Prosthesis of less than 6 months since implant.  Post-operative Assessment:  Post-procedure Vital Signs:  Pulse/HCG Rate: 8378 Temp: (!) 97.4 F (36.3 C) Resp: 20 BP: 132/80 SpO2: 99 %  EBL: None  Complications: No immediate post-treatment complications observed by team, or reported by patient.  Note: The patient tolerated the entire procedure well. A repeat set of vitals were taken after the procedure and the patient was kept under observation following institutional policy, for this type of procedure. Post-procedural neurological assessment was performed, showing return to baseline, prior to discharge. The patient was provided with post-procedure discharge instructions, including a section on how to identify potential problems. Should any problems arise concerning this procedure, the patient was given instructions to immediately contact us, at any time, without hesitation. In any case, we plan to contact the patient by telephone for a follow-up status report regarding this interventional procedure.  Comments:  No additional relevant information.  Plan of Care  Orders:  Orders Placed This Encounter  Procedures  . KNEE INJECTION    Hyalgan knee injection to be done by MD.    Scheduling Instructions:     Procedure: Intra-articular Hyalgan Knee injection #1  Side(s): Bilateral Knee     Sedation: None     Timeframe: Today    Order Specific Question:   Where will this procedure be performed?    Answer:   [facility]  . KNEE INJECTION    Hyalgan knee injection. Please order  Hyalgan.    Standing Status:   Future    Standing Expiration Date:   03/20/2020    Scheduling Instructions:     Procedure: Intra-articular Hyalgan Knee injection #2     Side: Bilateral     Sedation: None     Timeframe: in two (2) weeks    Order Specific Question:   Where will this procedure be performed?    Answer:   [facility]  . DG PAIN CLINIC C-ARM 1-60 MIN NO REPORT    Intraoperative interpretation by procedural physician at [facility].    Standing Status:   Standing    Number of Occurrences:   1    Order Specific Question:   Reason for exam:    Answer:   Assistance in needle guidance and placement for procedures requiring needle placement in or near specific anatomical locations not easily accessible without such assistance.  Raelene Bott Select 13 (MW), Urine    Volume: 30 ml(s). Minimum 3 ml of urine is needed. Document temperature of fresh sample. Indications: Long term (current) use of opiate analgesic (T41.962)    Order Specific Question:   Release to patient    Answer:   Immediate  . Informed Consent Details: Physician/Practitioner Attestation; Transcribe to consent form and obtain patient signature    Provider Attestation: I, Kenny [place] [name], MD, (Pain Management Specialist), the physician/practitioner, attest that I have discussed with the patient the benefits, risks, side effects, alternatives, likelihood of achieving goals and potential problems during recovery for the procedure that I have provided informed consent.    Scheduling Instructions:     Procedure: Therapeutic, bilateral, intra-articular Hyalgan knee injection     Indications: Chronic bilateral knee pain secondary to primary osteoarthritis of the knee     Note: Always confirm laterality of pain with Ms. Brigitte Pulse, before procedure.     Transcribe to consent form and obtain patient signature.  . Provide equipment / supplies at bedside    Equipment required: Single use, disposable, "Block Tray"     Standing Status:   Standing    Number of Occurrences:   1    Order Specific Question:   Specify    Answer:   Block Tray  . Miscellanous precautions    NOTE: Although It is true that patients can have allergies to shellfish and that shellfish contain iodine, most shellfish  allergies are due to two protein allergens present in the shellfish: tropomyosins and parvalbumin. Not all patients with shellfish allergies are allergic to iodine. However, as a precaution, avoid using iodine containing products.    Standing Status:   Standing    Number of Occurrences:   1   Chronic Opioid Analgesic:  Oxycodone IR 5 mg 1 tab PO 5X/day (#150/mo) (25 mg/day) MME/day:37.5 mg/day.   Medications ordered for procedure: Meds ordered this encounter  Medications  . lidocaine (XYLOCAINE) 2 % (with pres) injection 400 mg  . lidocaine (PF) (XYLOCAINE) 1 % injection 5 mL  . ropivacaine (PF) 2 mg/mL (0.2%) (NAROPIN) injection 5 mL  . Sodium Hyaluronate SOSY 2 mL  . Sodium Hyaluronate SOSY 2 mL  . pregabalin (LYRICA) 225 MG capsule    Sig: Take 1  capsule (225 mg total) by mouth 2 (two) times daily.    Dispense:  60 capsule    Refill:  5    Fill one day early if pharmacy is closed on scheduled refill date. May substitute for generic if available.  Marland Kitchen oxyCODONE (OXY IR/ROXICODONE) 5 MG immediate release tablet    Sig: Take 1 tablet (5 mg total) by mouth 5 (five) times daily. Must last 30 days    Dispense:  150 tablet    Refill:  0    Chronic Pain: STOP Act (Not applicable) Fill 1 day early if closed on refill date. Do not fill until: 03/17/2020. To last until: 04/16/2020. Avoid benzodiazepines within 8 hours of opioids  . oxyCODONE (OXY IR/ROXICODONE) 5 MG immediate release tablet    Sig: Take 1 tablet (5 mg total) by mouth 5 (five) times daily. Must last 30 days    Dispense:  150 tablet    Refill:  0    Chronic Pain: STOP Act (Not applicable) Fill 1 day early if closed on refill date. Do not fill until:  04/16/2020. To last until: 05/16/2020. Avoid benzodiazepines within 8 hours of opioids  . oxyCODONE (OXY IR/ROXICODONE) 5 MG immediate release tablet    Sig: Take 1 tablet (5 mg total) by mouth 5 (five) times daily. Must last 30 days    Dispense:  150 tablet    Refill:  0    Chronic Pain: STOP Act (Not applicable) Fill 1 day early if closed on refill date. Do not fill until: 05/16/2020. To last until: 06/15/2020. Avoid benzodiazepines within 8 hours of opioids  . lactated ringers infusion 1,000 mL  . midazolam (VERSED) 5 MG/5ML injection 1-2 mg    Make sure Flumazenil is available in the pyxis when using this medication. If oversedation occurs, administer 0.2 mg IV over 15 sec. If after 45 sec no response, administer 0.2 mg again over 1 min; may repeat at 1 min intervals; not to exceed 4 doses (1 mg)  . fentaNYL (SUBLIMAZE) injection 25-50 mcg    Make sure Narcan is available in the pyxis when using this medication. In the event of respiratory depression (RR< 8/min): Titrate NARCAN (naloxone) in increments of 0.1 to 0.2 mg IV at 2-3 minute intervals, until desired degree of reversal.  . ceFAZolin (ANCEF) IVPB 1 g/50 mL premix    Order Specific Question:   Antibiotic Indication:    Answer:   Surgical Prophylaxis    Order Specific Question:   Other Indication:    Answer:   Surgical Prophylaxis   Medications administered: We administered lidocaine, lidocaine (PF), ropivacaine (PF) 2 mg/mL (0.2%), midazolam, fentaNYL, and ceFAZolin.  See the medical record for exact dosing, route, and time of administration.  Follow-up plan:   Return in about 2 weeks (around 03/04/2020) for Procedure (no sedation): (B) Hyalgan #2 under fluoroscopic guidance.       Considering:  NOTE: NO RFA until BMI less or equal to 35.    Palliative PRN treatment(s):  Palliative bilateral IA Hyalgan knee injections  Palliative bilateral genicular NB  Palliative bilateral lumbar facet blocks     Recent Visits Date Type  Provider Dept  12/05/19 Telemedicine Karen Pointer, MD [facility]  Showing recent visits within past 90 days and meeting all other requirements   Today's Visits Date Type Provider Dept  02/19/20 Procedure visit Karen Pointer, MD [facility]  Showing today's visits and meeting all other requirements   Future Appointments Date Type Provider  Dept  03/05/20 Appointment Karen Pointer, MD [facility]  Showing future appointments within next 90 days and meeting all other requirements   Disposition: Discharge home  Discharge (Date  Time): 02/19/2020; 1041 hrs.   Primary Care Physician: Karen Crouch, MD Location: [facility] Note by: Karen Cola, MD Date: 02/19/2020; Time: 10:59 AM  Disclaimer:  Medicine is not an Chief Strategy Officer. The only guarantee in medicine is that nothing is guaranteed. It is important to note that the decision to proceed with this intervention was based on the information collected from the patient. The Data and conclusions were drawn from the patient's questionnaire, the interview, and the physical examination. Because the information was provided in large part by the patient, it cannot be guaranteed that it has not been purposely or unconsciously manipulated. Every effort has been made to obtain as much relevant data as possible for this evaluation. It is important to note that the conclusions that lead to this procedure are derived in large part from the available data. Always take into account that the treatment will also be dependent on availability of resources and existing treatment guidelines, considered by other Pain Management Practitioners as being common knowledge and practice, at the time of the intervention. For Medico-Legal purposes, it is also important to point out that variation in procedural techniques and pharmacological choices are the acceptable norm. The indications,  contraindications, technique, and results of the above procedure should only be interpreted and judged by a Board-Certified Interventional Pain Specialist with extensive familiarity and expertise in the same exact procedure and technique.

## 2020-02-20 ENCOUNTER — Telehealth: Payer: Self-pay | Admitting: *Deleted

## 2020-02-20 ENCOUNTER — Encounter (INDEPENDENT_AMBULATORY_CARE_PROVIDER_SITE_OTHER): Payer: Self-pay | Admitting: Nurse Practitioner

## 2020-02-20 NOTE — Progress Notes (Signed)
Subjective:    Patient ID: Karen Lewis, female    DOB: 1971/07/01, 49 y.o.   MRN: [id number] Chief Complaint  Patient presents with   Follow-up    5monthfollow up    The patient returns to the office for followup evaluation regarding leg swelling.  The swelling has persisted but the patient does experience significant headaches with utilization of lymphedema pump.  Therefore she has not used it very much.  The pain associated with swelling is essentially eliminated. There have not been any interval development of a ulcerations or wounds.  The patient denies problems with the pump, noting it is working well and the leggings are in good condition.  The patient was also referred to the lymphedema clinic however it was not helpful with management of her lymphedema.  The patient is also somewhat lethargic today due to having a recent knee injection where Versed was used.   Review of Systems  Constitutional: Positive for fatigue.  Cardiovascular: Positive for leg swelling.  All other systems reviewed and are negative.      Objective:   Physical Exam Vitals reviewed.  Constitutional:      Appearance: She is obese.     Comments: Drowsy  HENT:     Head: Normocephalic.  Cardiovascular:     Rate and Rhythm: Normal rate and regular rhythm.     Pulses: Normal pulses.  Pulmonary:     Effort: Pulmonary effort is normal.     Breath sounds: Normal breath sounds.  Musculoskeletal:     Right lower leg: 1+ Edema present.     Left lower leg: 1+ Edema present.  Neurological:     Mental Status: She is alert, oriented to person, place, and time and easily aroused.  Psychiatric:        Mood and Affect: Mood normal.        Behavior: Behavior normal. Behavior is cooperative.        Thought Content: Thought content normal.        Judgment: Judgment normal.     BP (!) 149/82 (BP Location: Right Arm)    Pulse (!) 104    Resp 16    Wt (!) 376 lb (170.6 kg)    BMI 64.54 kg/m   Past Medical  History:  Diagnosis Date   Anemia    Anginal pain (HCC)    Anxiety    Arthralgia of hip 07/29/2015   Arthritis    Arthritis, degenerative 07/29/2015   Asthma    Cephalalgia 07/25/2014   Dependence on unknown drug ([place]    multiplt controlled drug dependence   Depression    Diabetes mellitus without complication ([place]    Dysrhythmia    Eczema    Fibromyalgia    Gastritis    GERD (gastroesophageal reflux disease)    Gonalgia 07/29/2015   Overview:  Overview:  The patient has had bilateral intra-articular Hyalgan injections done on 07/16/2014 and although she seems to do well with this type of therapy, apparently her insurance company does not want to pay for they Hyalgan. On 11/27/2014 the patient underwent a bilateral genicular nerve block with excellent results. On 01/28/2015 she had a right knee genicular radiofrequency ablatio   Gout    H/O cardiovascular disorder 03/10/2015   H/O surgical procedure 12/05/2012   Overview:  LSG ([facility] - April 2013)     H/O thyroid disease 03/10/2015   Headache    Herpes    History of artificial joint 07/29/2015  History of hiatal hernia    Hypertension    Hypomagnesemia    Hypothyroidism    IDA (iron deficiency anemia) 05/28/2019   LBP (low back pain) 07/29/2015   Neuromuscular disorder (HCC)    Obesity    PCOS (polycystic ovarian syndrome)    Primary osteoarthritis of both knees 07/29/2015   Sleep apnea    not using a Cpap machine at this time   Thyroid nodule bilateral   Umbilical hernia     Social History   Socioeconomic History   Marital status: Significant Other    Spouse name: Not on file   Number of children: 0   Years of education: 12   Highest education level: High school graduate  Occupational History   Occupation: Disabled  Tobacco Use   Smoking status: Never Smoker   Smokeless tobacco: Never Used  Substance and Sexual Activity   Alcohol use: No    Alcohol/week: 0.0  standard drinks   Drug use: No   Sexual activity: Not Currently    Birth control/protection: Injection  Other Topics Concern   Not on file  Social History Narrative   Not on file   Social Determinants of Health   Financial Resource Strain: Low Risk    Difficulty of Paying Living Expenses: Not hard at all  Food Insecurity: No Food Insecurity   Worried About Charity fundraiser in the Last Year: Never true   [place] in the Last Year: Never true  Transportation Needs: No Transportation Needs   Lack of Transportation (Medical): No   Lack of Transportation (Non-Medical): No  Physical Activity: Inactive   Days of Exercise per Week: 0 days   Minutes of Exercise per Session: 0 min  Stress: Stress Concern Present   Feeling of Stress : Rather much  Social Connections: Unknown   Frequency of Communication with Friends and Family: More than three times a week   Frequency of Social Gatherings with Friends and Family: More than three times a week   Attends Religious Services: More than 4 times per year   Active Member of Clubs or Organizations: No   Attends Archivist Meetings: Never   Marital Status: Not on file  Intimate Partner Violence: Not At Risk   Fear of Current or Ex-Partner: No   Emotionally Abused: No   Physically Abused: No   Sexually Abused: No    Past Surgical History:  Procedure Laterality Date   APPENDECTOMY     CARPAL TUNNEL RELEASE Bilateral    CHOLECYSTECTOMY     DIAGNOSTIC LAPAROSCOPY     HIATAL HERNIA REPAIR     JOINT REPLACEMENT Left hip   LAPAROSCOPIC PARTIAL GASTRECTOMY     left trigger finger     peniculectomy N/A 07/05/2018   ROUX-EN-Y GASTRIC BYPASS  06/03/2017   SHOULDER ARTHROSCOPY Right    THYROIDECTOMY N/A 11/12/2015   Procedure: THYROIDECTOMY;  Surgeon: Clyde Canterbury, MD;  Location: [facility] ORS;  Service: ENT;  Laterality: N/A;   TOTAL HIP ARTHROPLASTY Right 11/27/2019   Procedure: TOTAL HIP  ARTHROPLASTY ANTERIOR APPROACH;  Surgeon: Hessie Knows, MD;  Location: [facility] ORS;  Service: Orthopedics;  Laterality: Right;   TRIGGER FINGER RELEASE Right     Family History  Problem Relation Age of Onset   Anxiety disorder Mother    Depression Mother    Alcohol abuse Mother    Diabetes Mother    Hypertension Mother    Kidney cancer Mother    Sleep apnea Mother  Alcohol abuse Father    Anxiety disorder Father    Depression Father    Post-traumatic stress disorder Father    Kidney failure Father    COPD Father    Diabetes Father    Hypertension Father    Sleep apnea Father    Depression Brother    Diabetes Brother    Hypertension Brother    Sleep apnea Brother    Breast cancer Paternal Aunt    Bladder Cancer Neg Hx    Prostate cancer Neg Hx     Allergies  Allergen Reactions   Bactrim [Sulfamethoxazole-Trimethoprim] Hives   Omalizumab Itching and Hives   Ciprofloxacin Other (See Comments)    myalgia    Shellfish Allergy Other (See Comments)    unknown   Nsaids Nausea Only and Other (See Comments)    Stomach upset       Assessment & Plan:   1. Lymphedema  No surgery or intervention at this point in time.    I have reviewed my discussion with the patient regarding lymphedema and why it  causes symptoms.  Patient will continue wearing graduated compression stockings class 1 (20-30 mmHg) on a daily basis a prescription was given. The patient is reminded to put the stockings on first thing in the morning and removing them in the evening. The patient is instructed specifically not to sleep in the stockings.   In addition, behavioral modification throughout the day will be continued.  This will include frequent elevation (such as in a recliner), use of over the counter pain medications as needed and exercise such as walking.  I have reviewed systemic causes for chronic edema such as liver, kidney and cardiac etiologies and there does not  appear to be any significant changes in these organ systems over the past year.  The patient is under the impression that these organ systems are all stable and unchanged.    The patient will continue aggressive use of the  lymph pump.  This will continue to improve the edema control and prevent sequela such as ulcers and infections.  Patient is also advised to contact the lymphedema pump company to try to turn down the settings to see if it helps with her headaches and if that is more tolerable.  Patient should also try to use alternating legs as well instead of doing both legs at the same time to see if that also makes a difference.  The patient will follow-up up in 6 months.   2. Type II diabetes mellitus with complication ([place]) Continue hypoglycemic medications as already ordered, these medications have been reviewed and there are no changes at this time.  Hgb A1C to be monitored as already arranged by primary service    Current Outpatient Medications on File Prior to Visit  Medication Sig Dispense Refill   ACCU-CHEK AVIVA PLUS test strip      acetaminophen (TYLENOL) 650 MG CR tablet Take 650-1,300 mg by mouth every 8 (eight) hours as needed for pain.     acidophilus (RISAQUAD) CAPS capsule Take 1 capsule by mouth daily.     albuterol (PROVENTIL HFA;VENTOLIN HFA) 108 (90 BASE) MCG/ACT inhaler Inhale 2 puffs into the lungs every 6 (six) hours as needed for wheezing or shortness of breath.     amitriptyline (ELAVIL) 100 MG tablet Take 100 mg by mouth at bedtime.     Ascorbic Acid (VITAMIN C) 1000 MG tablet Take 1,000 mg by mouth daily.      Biotin 10 MG  CAPS Take 10 mg by mouth daily.      butalbital-acetaminophen-caffeine (FIORICET) 50-325-40 MG tablet Take 1 tablet by mouth every 6 (six) hours as needed for migraine.      calcium carbonate (TUMS EX) 750 MG chewable tablet Chew 1 tablet by mouth daily.      clonazePAM (KLONOPIN) 0.5 MG tablet Take 1 tablet (0.5 mg total) by  mouth 3 (three) times daily as needed for anxiety. 30 tablet 0   Cyanocobalamin (B-12) 2500 MCG TABS Take 2,500 mcg by mouth daily.     diclofenac sodium (VOLTAREN) 1 % GEL Apply 2 g topically 4 (four) times daily. 100 g 1   diphenhydrAMINE (BENADRYL) 25 MG tablet Take 25 mg by mouth every 8 (eight) hours as needed for itching or allergies.      EPINEPHrine 0.3 mg/0.3 mL IJ SOAJ injection Inject 0.3 mg into the muscle as needed for anaphylaxis.      ergocalciferol (VITAMIN D2) 1.25 MG (50000 UT) capsule Take 50,000 Units by mouth 2 (two) times a week.      famotidine (PEPCID) 20 MG tablet Take 20 mg by mouth daily as needed for heartburn or indigestion.      glucose blood (ACCU-CHEK AVIVA PLUS) test strip Use 2 (two) times daily     levocetirizine (XYZAL) 5 MG tablet Take 5 mg by mouth at bedtime.      levothyroxine (SYNTHROID) 125 MCG tablet Take 250 mcg by mouth daily before breakfast.      lisinopril (ZESTRIL) 5 MG tablet Take 5 mg by mouth at bedtime.      magnesium oxide (MAG-OX) 400 MG tablet Take 800-1,200 mg by mouth See admin instructions. Take 1200 mg by mouth in the morning and 800 mg at bedtime     meclizine (ANTIVERT) 25 MG tablet Take 25 mg by mouth 3 (three) times daily as needed for dizziness.      medroxyPROGESTERone (DEPO-PROVERA) 150 MG/ML injection Inject 150 mg into the muscle every 3 (three) months.      metFORMIN (GLUCOPHAGE) 1000 MG tablet Take 1,000 mg by mouth 2 (two) times daily with a meal.      metoprolol tartrate (LOPRESSOR) 50 MG tablet Take 75 mg by mouth 2 (two) times daily.      montelukast (SINGULAIR) 10 MG tablet Take 10 mg by mouth at bedtime.      Multiple Vitamin (MULTI-VITAMIN) tablet Take 2 tablets by mouth daily.     mupirocin ointment (BACTROBAN) 2 % APPLY TO WOUND DAILY. 22 g 11   naloxone (NARCAN) nasal spray 4 mg/0.1 mL Spray into one nostril. Repeat with second device into other nostril after 2-3 minutes if no or minimal response.  (Patient taking differently: Place 0.4 mg into the nose once. Spray into one nostril. Repeat with second device into other nostril after 2-3 minutes if no or minimal response.) 1 kit 0   naphazoline-pheniramine (NAPHCON-A) 0.025-0.3 % ophthalmic solution Place 1 drop into both eyes 4 (four) times daily as needed for eye irritation.     omeprazole (PRILOSEC) 40 MG capsule Take 40 mg by mouth in the morning and at bedtime.     [START ON 03/17/2020] oxyCODONE (OXY IR/ROXICODONE) 5 MG immediate release tablet Take 1 tablet (5 mg total) by mouth 5 (five) times daily. Must last 30 days 150 tablet 0   [START ON 04/16/2020] oxyCODONE (OXY IR/ROXICODONE) 5 MG immediate release tablet Take 1 tablet (5 mg total) by mouth 5 (five) times daily. Must last 30  days 150 tablet 0   [START ON 05/16/2020] oxyCODONE (OXY IR/ROXICODONE) 5 MG immediate release tablet Take 1 tablet (5 mg total) by mouth 5 (five) times daily. Must last 30 days 150 tablet 0   [START ON 03/05/2020] pregabalin (LYRICA) 225 MG capsule Take 1 capsule (225 mg total) by mouth 2 (two) times daily. 60 capsule 5   QUEtiapine (SEROQUEL) 100 MG tablet Take 100 mg by mouth at bedtime.     rosuvastatin (CRESTOR) 40 MG tablet Take 40 mg by mouth daily.      sertraline (ZOLOFT) 100 MG tablet Take 200 mg by mouth daily.      spironolactone (ALDACTONE) 25 MG tablet Take 25 mg by mouth 2 (two) times daily.      SUPER B COMPLEX/C PO Take 1 tablet by mouth at bedtime.      tiZANidine (ZANAFLEX) 4 MG tablet Take 1 tablet (4 mg total) by mouth 2 (two) times daily as needed for muscle spasms. 30 tablet 0   triamcinolone (NASACORT AQ) 55 MCG/ACT AERO nasal inhaler Place 2 sprays into the nose 2 (two) times daily as needed (allergies).      zolpidem (AMBIEN) 10 MG tablet Take 10 mg by mouth at bedtime as needed for sleep.     zonisamide (ZONEGRAN) 50 MG capsule Take 150 mg by mouth at bedtime.      No current facility-administered medications on file prior  to visit.    There are no Patient Instructions on file for this visit. No follow-ups on file.   Kris Hartmann, NP

## 2020-02-20 NOTE — Telephone Encounter (Signed)
No problems post procedure. 

## 2020-02-21 ENCOUNTER — Ambulatory Visit (INDEPENDENT_AMBULATORY_CARE_PROVIDER_SITE_OTHER): Payer: Medicare HMO | Admitting: Vascular Surgery

## 2020-02-22 LAB — TOXASSURE SELECT 13 (MW), URINE

## 2020-02-25 ENCOUNTER — Other Ambulatory Visit: Payer: Self-pay | Admitting: Ophthalmology

## 2020-02-25 DIAGNOSIS — M65331 Trigger finger, right middle finger: Secondary | ICD-10-CM | POA: Diagnosis not present

## 2020-02-25 DIAGNOSIS — G932 Benign intracranial hypertension: Secondary | ICD-10-CM

## 2020-02-25 DIAGNOSIS — H532 Diplopia: Secondary | ICD-10-CM

## 2020-03-03 NOTE — Progress Notes (Deleted)
Patient: Karen Lewis  Service Category: E/M  Provider: Gaspar Cola, MD  DOB: 1971/05/16  DOS: 03/05/2020  Location: Office  MRN: [id number]  Setting: Ambulatory outpatient  Referring Provider: Idelle Crouch, MD  Type: Established Patient  Specialty: Interventional Pain Management  PCP: Idelle Crouch, MD  Location: Remote location  Delivery: TeleHealth     Virtual Encounter - Pain Management PROVIDER NOTE: Information contained herein reflects review and annotations entered in association with encounter. Interpretation of such information and data should be left to medically-trained personnel. Information provided to patient can be located elsewhere in the medical record under "Patient Instructions". Document created using STT-dictation technology, any transcriptional errors that may result from process are unintentional.    Contact & Pharmacy Preferred: [phone] Home: [phone] (home) Mobile: [phone] (mobile) E-mail: [email or web address]  [place], [place] - [address] [place] [address] Phone: [phone] Fax: [phone]   Pre-screening  [name] offered "in-person" vs "virtual" encounter. She indicated preferring virtual for this encounter.   Reason COVID-19*  Social distancing based on CDC and AMA recommendations.   I contacted Karen Lewis on 03/05/2020 via telephone.      I clearly identified myself as Gaspar Cola, MD. I verified that I was speaking with the correct person using two identifiers (Name: Karen Lewis, and date of birth: 08/09/1971).  Consent I sought verbal advanced consent from Karen Lewis for virtual visit interactions. I informed [name] of possible security and privacy concerns, risks, and limitations associated with providing "not-in-person" medical evaluation and management services. I also informed [name] of the availability of "in-person" appointments. Finally, I informed  her that there would be a charge for the virtual visit and that she could be  personally, fully or partially, financially responsible for it. [name] expressed understanding and agreed to proceed.   Historic Elements   [name] is a 49 y.o. year old, female patient evaluated today after her last contact with our practice on 02/20/2020. [name]  has a past medical history of Anemia, Anginal pain ([place]), Anxiety, Arthralgia of hip (07/29/2015), Arthritis, Arthritis, degenerative (07/29/2015), Asthma, Cephalalgia (07/25/2014), Dependence on unknown drug ([place]), Depression, Diabetes mellitus without complication ([place]), Dysrhythmia, Eczema, Fibromyalgia, Gastritis, GERD (gastroesophageal reflux disease), Gonalgia (07/29/2015), Gout, H/O cardiovascular disorder (03/10/2015), H/O surgical procedure (12/05/2012), H/O thyroid disease (03/10/2015), Headache, Herpes, History of artificial joint (07/29/2015), History of hiatal hernia, Hypertension, Hypomagnesemia, Hypothyroidism, IDA (iron deficiency anemia) (05/28/2019), LBP (low back pain) (07/29/2015), Neuromuscular disorder ([place]), Obesity, PCOS (polycystic ovarian syndrome), Primary osteoarthritis of both knees (07/29/2015), Sleep apnea, Thyroid nodule (bilateral), and Umbilical hernia. She also  has a past surgical history that includes Laparoscopic partial gastrectomy; Shoulder arthroscopy (Right); Joint replacement (Left, hip); Carpal tunnel release (Bilateral); Diagnostic laparoscopy; Cholecystectomy; Trigger finger release (Right); Thyroidectomy (N/A, 11/12/2015); left trigger finger; Roux-en-Y Gastric Bypass (06/03/2017); Hiatal hernia repair; peniculectomy (N/A, 07/05/2018); Appendectomy; and Total hip arthroplasty (Right, 11/27/2019). Ms. Bilton has a current medication list which includes the following prescription(s): accu-chek aviva plus, acetaminophen, acidophilus, albuterol, amitriptyline, vitamin c, biotin, butalbital-acetaminophen-caffeine, calcium carbonate,  clonazepam, b-12, diclofenac sodium, diphenhydramine, epinephrine, ergocalciferol, famotidine, accu-chek aviva plus, levocetirizine, levothyroxine, lisinopril, magnesium oxide, meclizine, medroxyprogesterone, metformin, metoprolol tartrate, montelukast, multi-vitamin, mupirocin ointment, naloxone, naphazoline-pheniramine, omeprazole, [START ON 03/17/2020] oxycodone, [START ON 04/16/2020] oxycodone, [START ON 05/16/2020] oxycodone, [START ON 03/05/2020] pregabalin, quetiapine, rosuvastatin, sertraline, spironolactone, b complex-c, tizanidine, triamcinolone, zolpidem, and zonisamide. She  reports that she has  never smoked. She has never used smokeless tobacco. She reports that she does not drink alcohol and does not use drugs. [name] is allergic to bactrim [sulfamethoxazole-trimethoprim], omalizumab, ciprofloxacin, shellfish allergy, and nsaids.   HPI  Today, she is being contacted for  ***   Pharmacotherapy Assessment  Analgesic: Oxycodone IR 5 mg 1 tab PO 5X/day (#150/mo) (25 mg/day) MME/day:37.5 mg/day.   Monitoring: [place] PMP: PDMP reviewed during this encounter.       Pharmacotherapy: No side-effects or adverse reactions reported. Compliance: No problems identified. Effectiveness: Clinically acceptable. Plan: Refer to "POC".  UDS:  Summary  Date Value Ref Range Status  02/19/2020 Note  Final    Comment:    ==================================================================== [employer] Select 13 (MW) ==================================================================== Test                             Result       Flag       Units  Drug Present and Declared for Prescription Verification   7-aminoclonazepam              44           EXPECTED   ng/mg creat    7-aminoclonazepam is an expected metabolite of clonazepam. Source of    clonazepam is a scheduled prescription medication.  Drug Present not Declared for Prescription Verification   Oxycodone                      199          UNEXPECTED  ng/mg creat   Oxymorphone                    127          UNEXPECTED ng/mg creat   Noroxycodone                   1026         UNEXPECTED ng/mg creat   Noroxymorphone                 56           UNEXPECTED ng/mg creat    Sources of oxycodone are scheduled prescription medications.    Oxymorphone, noroxycodone, and noroxymorphone are expected    metabolites of oxycodone. Oxymorphone is also available as a    scheduled prescription medication.  Drug Absent but Declared for Prescription Verification   Butalbital                     Not Detected UNEXPECTED ==================================================================== Test                      Result    Flag   Units      Ref Range   Creatinine              165              mg/dL      >=20 ==================================================================== Declared Medications:  The flagging and interpretation on this report are based on the  following declared medications.  Unexpected results may arise from  inaccuracies in the declared medications.   **Note: The testing scope of this panel includes these medications:   Butalbital (Fioricet)  Clonazepam (Klonopin)   **Note: The testing scope of this panel does not include the  following reported medications:   Acetaminophen (Tylenol)  Acetaminophen (Fioricet)  Albuterol (Ventolin HFA)  Amitriptyline (Elavil)  Biotin  Caffeine (Fioricet)  Calcium (Tums)  Diphenhydramine (Benadryl)  Epinephrine (EpiPen)  Eye Drops  Famotidine (Pepcid)  Levocetirizine (Xyzal)  Levothyroxine (Synthroid)  Lisinopril (Zestril)  Magnesium (Mag-Ox)  Meclizine (Antivert)  Medroxyprogesterone (Depo-Provera)  Metformin (Glucophage)  Metoprolol (Lopressor)  Montelukast (Singulair)  Multivitamin  Naloxone (Narcan)  Pregabalin (Lyrica)  Probiotic  Quetiapine (Seroquel)  Rosuvastatin (Crestor)  Sertraline (Zoloft)  Spironolactone (Aldactone)  Tizanidine (Zanaflex)  Topical  Topical  Diclofenac  Triamcinolone (Nasacort)  Vitamin B  Vitamin B12  Vitamin C  Vitamin D2  Zolpidem (Ambien)  Zonisamide (Zonegran) ==================================================================== For clinical consultation, please call [phone]. ====================================================================     Laboratory Chemistry Profile   Renal Lab Results  Component Value Date   BUN 12 12/11/2019   CREATININE 1.07 (H) 12/11/2019   GFRAA >60 12/11/2019   GFRNONAA >60 12/11/2019     Hepatic Lab Results  Component Value Date   AST 27 12/11/2019   ALT 23 12/11/2019   ALBUMIN 3.7 12/11/2019   ALKPHOS 99 12/11/2019     Electrolytes Lab Results  Component Value Date   NA 137 12/11/2019   K 4.3 12/11/2019   CL 101 12/11/2019   CALCIUM 8.6 (L) 12/11/2019   MG 1.7 12/11/2019   PHOS 5.5 (H) 05/22/2019     Bone Lab Results  Component Value Date   VD25OH 27.4 (L) 11/24/2015   VD125OH2TOT 41.5 11/24/2015     Inflammation (CRP: Acute Phase) (ESR: Chronic Phase) Lab Results  Component Value Date   CRP 0.7 09/27/2019   ESRSEDRATE 30 (H) 09/27/2019       Note: Above Lab results reviewed.   Imaging  DG PAIN CLINIC C-ARM 1-60 MIN NO REPORT Fluoro was used, but no Radiologist interpretation will be provided.  Please refer to "NOTES" tab for provider progress note.  Assessment  There were no encounter diagnoses.  Plan of Care  Problem-specific:  No problem-specific Assessment & Plan notes found for this encounter.  Ms. GLORINE HANRATTY has a current medication list which includes the following long-term medication(s): albuterol, clonazepam, diphenhydramine, levothyroxine, lisinopril, magnesium oxide, metformin, metoprolol tartrate, montelukast, naloxone, [START ON 03/17/2020] oxycodone, [START ON 04/16/2020] oxycodone, [START ON 05/16/2020] oxycodone, [START ON 03/05/2020] pregabalin, spironolactone, triamcinolone, and zonisamide.  Pharmacotherapy (Medications  Ordered): No orders of the defined types were placed in this encounter.  Orders:  No orders of the defined types were placed in this encounter.  Follow-up plan:   No follow-ups on file.      Considering:  NOTE: NO RFA until BMI less or equal to 35.    Palliative PRN treatment(s):  Palliative bilateral IA Hyalgan knee injections  Palliative bilateral genicular NB  Palliative bilateral lumbar facet blocks      Recent Visits Date Type Provider Dept  02/19/20 Procedure visit Milinda Pointer, MD [facility]  12/05/19 Telemedicine Milinda Pointer, MD [facility]  Showing recent visits within past 90 days and meeting all other requirements Future Appointments Date Type Provider Dept  03/05/20 Appointment Milinda Pointer, [facility]  03/06/20 Appointment Milinda Pointer, MD [facility]  Showing future appointments within next 90 days and meeting all other requirements  I discussed the assessment and treatment plan with the patient. The patient was provided an opportunity to ask questions and all were answered. The patient agreed with the plan and demonstrated an understanding of the instructions.  Patient advised to call back or seek an in-person evaluation if  the symptoms or condition worsens.  Duration of encounter: *** minutes.  Note by: Gaspar Cola, MD Date: 03/05/2020; Time: 10:15 AM

## 2020-03-05 ENCOUNTER — Telehealth: Payer: Medicare HMO | Admitting: Pain Medicine

## 2020-03-06 ENCOUNTER — Ambulatory Visit: Payer: Medicare HMO | Admitting: Pain Medicine

## 2020-03-07 ENCOUNTER — Other Ambulatory Visit: Payer: Self-pay

## 2020-03-07 NOTE — Patient Outreach (Signed)
[facility]) Care Management  [place]  03/07/2020   Karen Lewis 1970-12-04 [id number]  Subjective: Telephone call to patient for disease management follow up. Patient reports doing good.  She states she has a follow up with orthopedic today after trigger finger surgery.  She reports area looks good and that she is supposed to get the sutures out today. PT still seeing patient currently. Patient reports blood sugars ranging in 120-150 but some spikes in the 200's.  Discussed limiting carbohydrates as a way to control sugars. She verbalized understanding and voices no concerns.   Objective:   Encounter Medications:  Outpatient Encounter Medications as of 03/07/2020  Medication Sig  . ACCU-CHEK AVIVA PLUS test strip   . acetaminophen (TYLENOL) 650 MG CR tablet Take 650-1,300 mg by mouth every 8 (eight) hours as needed for pain.  Marland Kitchen acidophilus (RISAQUAD) CAPS capsule Take 1 capsule by mouth daily.  Marland Kitchen albuterol (PROVENTIL HFA;VENTOLIN HFA) 108 (90 BASE) MCG/ACT inhaler Inhale 2 puffs into the lungs every 6 (six) hours as needed for wheezing or shortness of breath.  Marland Kitchen amitriptyline (ELAVIL) 100 MG tablet Take 100 mg by mouth at bedtime.  . Ascorbic Acid (VITAMIN C) 1000 MG tablet Take 1,000 mg by mouth daily.   . Biotin 10 MG CAPS Take 10 mg by mouth daily.   . butalbital-acetaminophen-caffeine (FIORICET) 50-325-40 MG tablet Take 1 tablet by mouth every 6 (six) hours as needed for migraine.   . calcium carbonate (TUMS EX) 750 MG chewable tablet Chew 1 tablet by mouth daily.   . clonazePAM (KLONOPIN) 0.5 MG tablet Take 1 tablet (0.5 mg total) by mouth 3 (three) times daily as needed for anxiety.  . Cyanocobalamin (B-12) 2500 MCG TABS Take 2,500 mcg by mouth daily.  . diclofenac sodium (VOLTAREN) 1 % GEL Apply 2 g topically 4 (four) times daily.  . diphenhydrAMINE (BENADRYL) 25 MG tablet Take 25 mg by mouth every 8 (eight) hours as needed for itching or allergies.   Marland Kitchen  EPINEPHrine 0.3 mg/0.3 mL IJ SOAJ injection Inject 0.3 mg into the muscle as needed for anaphylaxis.   Marland Kitchen ergocalciferol (VITAMIN D2) 1.25 MG (50000 UT) capsule Take 50,000 Units by mouth 2 (two) times a week.   . famotidine (PEPCID) 20 MG tablet Take 20 mg by mouth daily as needed for heartburn or indigestion.   Marland Kitchen glucose blood (ACCU-CHEK AVIVA PLUS) test strip Use 2 (two) times daily  . levocetirizine (XYZAL) 5 MG tablet Take 5 mg by mouth at bedtime.   Marland Kitchen levothyroxine (SYNTHROID) 125 MCG tablet Take 250 mcg by mouth daily before breakfast.   . lisinopril (ZESTRIL) 5 MG tablet Take 5 mg by mouth at bedtime.   . magnesium oxide (MAG-OX) 400 MG tablet Take 800-1,200 mg by mouth See admin instructions. Take 1200 mg by mouth in the morning and 800 mg at bedtime  . meclizine (ANTIVERT) 25 MG tablet Take 25 mg by mouth 3 (three) times daily as needed for dizziness.   . medroxyPROGESTERone (DEPO-PROVERA) 150 MG/ML injection Inject 150 mg into the muscle every 3 (three) months.   . metFORMIN (GLUCOPHAGE) 1000 MG tablet Take 1,000 mg by mouth 2 (two) times daily with a meal.   . metoprolol tartrate (LOPRESSOR) 50 MG tablet Take 75 mg by mouth 2 (two) times daily.   . montelukast (SINGULAIR) 10 MG tablet Take 10 mg by mouth at bedtime.   . Multiple Vitamin (MULTI-VITAMIN) tablet Take 2 tablets by mouth daily.  Marland Kitchen  mupirocin ointment (BACTROBAN) 2 % APPLY TO WOUND DAILY.  . naloxone (NARCAN) nasal spray 4 mg/0.1 mL Spray into one nostril. Repeat with second device into other nostril after 2-3 minutes if no or minimal response. (Patient taking differently: Place 0.4 mg into the nose once. Spray into one nostril. Repeat with second device into other nostril after 2-3 minutes if no or minimal response.)  . naphazoline-pheniramine (NAPHCON-A) 0.025-0.3 % ophthalmic solution Place 1 drop into both eyes 4 (four) times daily as needed for eye irritation.  Marland Kitchen omeprazole (PRILOSEC) 40 MG capsule Take 40 mg by mouth in  the morning and at bedtime.  Derrill Memo ON 03/17/2020] oxyCODONE (OXY IR/ROXICODONE) 5 MG immediate release tablet Take 1 tablet (5 mg total) by mouth 5 (five) times daily. Must last 30 days  . [START ON 04/16/2020] oxyCODONE (OXY IR/ROXICODONE) 5 MG immediate release tablet Take 1 tablet (5 mg total) by mouth 5 (five) times daily. Must last 30 days  . [START ON 05/16/2020] oxyCODONE (OXY IR/ROXICODONE) 5 MG immediate release tablet Take 1 tablet (5 mg total) by mouth 5 (five) times daily. Must last 30 days  . pregabalin (LYRICA) 225 MG capsule Take 1 capsule (225 mg total) by mouth 2 (two) times daily.  . QUEtiapine (SEROQUEL) 100 MG tablet Take 100 mg by mouth at bedtime.  . rosuvastatin (CRESTOR) 40 MG tablet Take 40 mg by mouth daily.   . sertraline (ZOLOFT) 100 MG tablet Take 200 mg by mouth daily.   Marland Kitchen spironolactone (ALDACTONE) 25 MG tablet Take 25 mg by mouth 2 (two) times daily.   . SUPER B COMPLEX/C PO Take 1 tablet by mouth at bedtime.   Marland Kitchen tiZANidine (ZANAFLEX) 4 MG tablet Take 1 tablet (4 mg total) by mouth 2 (two) times daily as needed for muscle spasms.  Marland Kitchen triamcinolone (NASACORT AQ) 55 MCG/ACT AERO nasal inhaler Place 2 sprays into the nose 2 (two) times daily as needed (allergies).   . zolpidem (AMBIEN) 10 MG tablet Take 10 mg by mouth at bedtime as needed for sleep.  Marland Kitchen zonisamide (ZONEGRAN) 50 MG capsule Take 150 mg by mouth at bedtime.    No facility-administered encounter medications on file as of 03/07/2020.    Functional Status:  In your present state of health, do you have any difficulty performing the following activities: 12/14/2019 12/13/2019  Hearing? N N  Vision? N N  Difficulty concentrating or making decisions? N N  Comment - -  Walking or climbing stairs? Y Y  Comment Right Hip Replacement. recent right hip replacement  Dressing or bathing? Y Y  Comment Right Hip Replacement. recent right hip replacement  Doing errands, shopping? Y Y  Comment Right Hip Replacement. recent  hip replacement- fiance helps  Preparing Food and eating ? Y Y  Comment Right Hip Replacement. fiance helps  Using the Toilet? Y (No Data)  Comment Right Hip Replacement. fiance helps  In the past six months, have you accidently leaked urine? N N  Do you have problems with loss of bowel control? N N  Managing your Medications? N N  Managing your Finances? N N  Housekeeping or managing your Housekeeping? Y Y  Comment Right Hip Replacement. fiance helps  Some recent data might be hidden    Fall/Depression Screening: Fall Risk  12/14/2019 12/13/2019 08/23/2018  Falls in the past year? 0 0 0  Number falls in past yr: 0 - -  Comment - - -  Injury with Fall? 0 - -  Comment - - -  Risk Factor Category  - - -  Risk for fall due to : No Fall Risks - -  Risk for fall due to: Comment - - -  Follow up Education provided;Falls prevention discussed - -   PHQ 2/9 Scores 12/14/2019 12/13/2019 04/27/2018 04/13/2018 03/30/2018 03/27/2018 03/07/2018  PHQ - 2 Score 4 4 0 0 0 0 0  PHQ- 9 Score 12 12 - - - - -  Exception Documentation - - - - - - -    Assessment: Patient continues to manage chronic illnesses.  No recent hospitalizations.  Plan:  [facility] CM Care Plan Problem One     Most Recent Value  Care Plan Problem One Recent Hip Replacement  Role Documenting the Problem One Care Management Telephonic Coordinator  Care Plan for Problem One Active  [facility] Long Term Goal  Over the next 90 days, patient will demonstrate and/or verbalize understanding of self-health management for long term care of Diabetes.   [facility] Long Term Goal Start Date 03/07/20  Interventions for Problem One Long Term Goal RN CM reviewed with patient sugar control, importance of monitoring sugars, and importance of diet and exercise.  [facility] CM Short Term Goal #2  Patient will keep A1c less than 7.0 within 30 days.  [facility] CM Short Term Goal #2 Start Date 03/07/20  Interventions for Short Term Goal #2 Patient reprots blood sugar in range of 120-150  with some spikes in the 200's. Discussed blood sugar control and importance of limiting carbohydrates.       RN CM will contact patient in the month of July and patient agreeable.   Jone Baseman, RN, MSN [place] Care Management Coordinator Direct Line [phone] Cell [phone] Toll Free: [phone]  Fax: [phone]

## 2020-03-12 NOTE — Progress Notes (Signed)
PROVIDER NOTE: Information contained herein reflects review and annotations entered in association with encounter. Interpretation of such information and data should be left to medically-trained personnel. Information provided to patient can be located elsewhere in the medical record under "Patient Instructions". Document created using STT-dictation technology, any transcriptional errors that may result from process are unintentional.    Patient: Karen Lewis  Service Category: Procedure  Provider: Gaspar Cola, MD  DOB: 1971/09/09  DOS: 03/13/2020  Location: [facility]  MRN: [id number]  Setting: Ambulatory - outpatient  Referring Provider: Milinda Pointer, MD  Type: Established Patient  Specialty: Interventional Pain Management  PCP: Idelle Crouch, MD   Primary Reason for Visit: Interventional Pain Management Treatment. CC: Knee Pain (bilateral)  Procedure:          Anesthesia, Analgesia, Anxiolysis:  Type: Palliative Intra-Articular Hyalgan Knee Injection #2  Region: Lateral infrapatellar Knee Region Level: Knee Joint Laterality: Bilateral  Type: Moderate (Conscious) Sedation combined with Local Anesthesia Indication(s): Analgesia and Anxiety Local Anesthetic: Lidocaine 1-2% Route: Intravenous (IV) IV Access: Secured Sedation: Meaningful verbal contact was maintained at all times during the procedure   Position: Supine w/ knee bent 20 to 30 degrees   Indications: 1. Chronic knee pain (Primary Area of Pain) (Bilateral) (R>L)   2. Secondary Osteoarthritis of knee (Bilateral) (R>L)   3. Class 3 severe obesity due to excess calories with serious comorbidity and body mass index (BMI) of 60.0 to 69.9 in adult [facility])    Pain Score: Pre-procedure: 3 /10 Post-procedure: 0-No pain/10   Pre-op Assessment:  Karen Lewis is a 49 y.o. (year old), female patient, seen today for interventional treatment. She  has a past surgical history that includes Laparoscopic partial  gastrectomy; Shoulder arthroscopy (Right); Joint replacement (Left, hip); Carpal tunnel release (Bilateral); Diagnostic laparoscopy; Cholecystectomy; Trigger finger release (Right); Thyroidectomy (N/A, 11/12/2015); left trigger finger; Roux-en-Y Gastric Bypass (06/03/2017); Hiatal hernia repair; peniculectomy (N/A, 07/05/2018); Appendectomy; and Total hip arthroplasty (Right, 11/27/2019). Karen Lewis has a current medication list which includes the following prescription(s): accu-chek aviva plus, acetaminophen, acidophilus, albuterol, amitriptyline, vitamin c, biotin, butalbital-acetaminophen-caffeine, calcium carbonate, clonazepam, b-12, diclofenac sodium, diphenhydramine, epinephrine, ergocalciferol, famotidine, accu-chek aviva plus, levocetirizine, levothyroxine, lisinopril, magnesium oxide, meclizine, medroxyprogesterone, metformin, metoprolol tartrate, montelukast, multi-vitamin, mupirocin ointment, naloxone, naphazoline-pheniramine, omeprazole, [START ON 03/17/2020] oxycodone, [START ON 04/16/2020] oxycodone, [START ON 05/16/2020] oxycodone, pregabalin, quetiapine, rosuvastatin, sertraline, spironolactone, b complex-c, tizanidine, triamcinolone, zolpidem, zonisamide, diclofenac sodium, and tab-a-vite, and the following Facility-Administered Medications: fentanyl and midazolam. Her primarily concern today is the Knee Pain (bilateral)  Initial Vital Signs:  Pulse/HCG Rate: 87ECG Heart Rate: 80 Temp: (!) 96.8 F (36 C) Resp: 20 BP: 128/77 SpO2: 98 %  BMI: Estimated body mass index is 64.3 kg/m as calculated from the following:   Height as of this encounter: 5\' 4"  (1.626 m).   Weight as of this encounter: 374 lb 9.6 oz (169.9 kg).  Risk Assessment: Allergies: Reviewed. She is allergic to bactrim [sulfamethoxazole-trimethoprim], omalizumab, ciprofloxacin, shellfish allergy, and nsaids.  Allergy Precautions: None required Coagulopathies: Reviewed. None identified.  Blood-thinner therapy: None at this  time Active Infection(s): Reviewed. None identified. Karen Lewis is afebrile  Site Confirmation: Karen Lewis was asked to confirm the procedure and laterality before marking the site Procedure checklist: Completed Consent: Before the procedure and under the influence of no sedative(s), amnesic(s), or anxiolytics, the patient was informed of the treatment options, risks and possible complications. To fulfill our ethical and legal obligations, as recommended by the American  Medical Association's Code of Ethics, I have informed the patient of my clinical impression; the nature and purpose of the treatment or procedure; the risks, benefits, and possible complications of the intervention; the alternatives, including doing nothing; the risk(s) and benefit(s) of the alternative treatment(s) or procedure(s); and the risk(s) and benefit(s) of doing nothing. The patient was provided information about the general risks and possible complications associated with the procedure. These may include, but are not limited to: failure to achieve desired goals, infection, bleeding, organ or nerve damage, allergic reactions, paralysis, and death. In addition, the patient was informed of those risks and complications associated to the procedure, such as failure to decrease pain; infection; bleeding; organ or nerve damage with subsequent damage to sensory, motor, and/or autonomic systems, resulting in permanent pain, numbness, and/or weakness of one or several areas of the body; allergic reactions; (i.e.: anaphylactic reaction); and/or death. Furthermore, the patient was informed of those risks and complications associated with the medications. These include, but are not limited to: allergic reactions (i.e.: anaphylactic or anaphylactoid reaction(s)); adrenal axis suppression; blood sugar elevation that in diabetics may result in ketoacidosis or comma; water retention that in patients with history of congestive heart failure may result  in shortness of breath, pulmonary edema, and decompensation with resultant heart failure; weight gain; swelling or edema; medication-induced neural toxicity; particulate matter embolism and blood vessel occlusion with resultant organ, and/or nervous system infarction; and/or aseptic necrosis of one or more joints. Finally, the patient was informed that Medicine is not an exact science; therefore, there is also the possibility of unforeseen or unpredictable risks and/or possible complications that may result in a catastrophic outcome. The patient indicated having understood very clearly. We have given the patient no guarantees and we have made no promises. Enough time was given to the patient to ask questions, all of which were answered to the patient's satisfaction. Ms. Mells has indicated that she wanted to continue with the procedure. Attestation: I, the ordering provider, attest that I have discussed with the patient the benefits, risks, side-effects, alternatives, likelihood of achieving goals, and potential problems during recovery for the procedure that I have provided informed consent. Date   Time: 03/13/2020 10:12 AM  Pre-Procedure Preparation:  Monitoring: As per clinic protocol. Respiration, ETCO2, SpO2, BP, heart rate and rhythm monitor placed and checked for adequate function Safety Precautions: Patient was assessed for positional comfort and pressure points before starting the procedure. Time-out: I initiated and conducted the "Time-out" before starting the procedure, as per protocol. The patient was asked to participate by confirming the accuracy of the "Time Out" information. Verification of the correct person, site, and procedure were performed and confirmed by me, the nursing staff, and the patient. "Time-out" conducted as per Joint Commission's Universal Protocol (UP.01.01.01). Time: 1104  Description of Procedure:          Target Area: Knee Joint Approach: Just above the Lateral tibial  plateau, lateral to the infrapatellar tendon. Area Prepped: Entire knee area, from the mid-thigh to the mid-shin. DuraPrep (Iodine Povacrylex [0.7% available iodine] and Isopropyl Alcohol, 74% w/w) Safety Precautions: Aspiration looking for blood return was conducted prior to all injections. At no point did we inject any substances, as a needle was being advanced. No attempts were made at seeking any paresthesias. Safe injection practices and needle disposal techniques used. Medications properly checked for expiration dates. SDV (single dose vial) medications used. Description of the Procedure: Protocol guidelines were followed. The patient was placed in position over  the fluoroscopy table. The target area was identified and the area prepped in the usual manner. Skin & deeper tissues infiltrated with local anesthetic. Appropriate amount of time allowed to pass for local anesthetics to take effect. The procedure needles were then advanced to the target area. Proper needle placement secured. Negative aspiration confirmed. Solution injected in intermittent fashion, asking for systemic symptoms every 0.5cc of injectate. The needles were then removed and the area cleansed, making sure to leave some of the prepping solution back to take advantage of its long term bactericidal properties. Vitals:   03/13/20 1109 03/13/20 1119 03/13/20 1129 03/13/20 1139  BP: 121/67 103/86 101/67 110/85  Pulse:      Resp: 18 17 (!) 24 17  Temp:  (!) 97.2 F (36.2 C)  (!) 97.3 F (36.3 C)  SpO2: 97% 98% 97% 98%  Weight:      Height:        Start Time: 1104 hrs. End Time: 1109 hrs. Materials:  Needle(s) Type: Spinal needle Gauge: 22G Length: 3.5-in Medication(s): Please see orders for medications and dosing details.  Imaging Guidance:          Type of Imaging Technique: Fluoroscopy Guidance (Non-spinal) Indication(s): Morbid obesity. Assistance in needle guidance and placement for procedures requiring needle  placement in or near specific anatomical locations impossible to access without such assistance. Exposure Time: Please see nurses notes. Contrast: None used. Fluoroscopic Guidance: I was personally present during the use of fluoroscopy. "Tunnel Vision Technique" used to obtain the best possible view of the target area. Parallax error corrected before commencing the procedure. "Direction-depth-direction" technique used to introduce the needle under continuous pulsed fluoroscopy. Once target was reached, antero-posterior, oblique, and lateral fluoroscopic projection used confirm needle placement in all planes. Images permanently stored in EMR. Ultrasound Guidance: N/A Interpretation: No contrast injected. I personally interpreted the imaging intraoperatively. Adequate needle placement confirmed in multiple planes. Permanent images saved into the patient's record.  Antibiotic Prophylaxis:   Anti-infectives (From admission, onward)   Start     Dose/Rate Route Frequency Ordered Stop   03/13/20 1015  ceFAZolin (ANCEF) IVPB 1 g/50 mL premix        1 g 100 mL/hr over 30 Minutes Intravenous  Once 03/13/20 1014 03/13/20 1115     Indication(s): Orthopedic Prosthesis of less than 6 months since implant.  Post-operative Assessment:  Post-procedure Vital Signs:  Pulse/HCG Rate: 8782 Temp: (!) 97.3 F (36.3 C) Resp: 17 BP: 110/85 SpO2: 98 %  EBL: None  Complications: No immediate post-treatment complications observed by team, or reported by patient.  Note: The patient tolerated the entire procedure well. A repeat set of vitals were taken after the procedure and the patient was kept under observation following institutional policy, for this type of procedure. Post-procedural neurological assessment was performed, showing return to baseline, prior to discharge. The patient was provided with post-procedure discharge instructions, including a section on how to identify potential problems. Should any  problems arise concerning this procedure, the patient was given instructions to immediately contact us, at any time, without hesitation. In any case, we plan to contact the patient by telephone for a follow-up status report regarding this interventional procedure.  Comments:  No additional relevant information.  Plan of Care  Orders:  Orders Placed This Encounter  Procedures   KNEE INJECTION    Hyalgan knee injection to be done by MD.    Scheduling Instructions:     Procedure: Intra-articular Hyalgan Knee injection #2  Side(s): Bilateral Knee     Sedation: With Sedation.     Timeframe: Today    Order Specific Question:   Where will this procedure be performed?    Answer:   [facility]   KNEE INJECTION    Hyalgan knee injection. Please order Hyalgan.    Standing Status:   Future    Standing Expiration Date:   04/13/2020    Scheduling Instructions:     Procedure: Intra-articular Hyalgan Knee injection #3 (fluoroscopy needed)     Side: Bilateral     Sedation: With Sedation.     Timeframe: in two (2) weeks    Order Specific Question:   Where will this procedure be performed?    Answer:   [facility]   DG PAIN CLINIC C-ARM 1-60 MIN NO REPORT    Intraoperative interpretation by procedural physician at [place].    Standing Status:   Standing    Number of Occurrences:   1    Order Specific Question:   Reason for exam:    Answer:   Assistance in needle guidance and placement for procedures requiring needle placement in or near specific anatomical locations not easily accessible without such assistance.   DG Cervical Spine With Flex & Extend    Patient presents with axial pain with possible radicular component.  In addition to any acute findings, please report on:  1. Facet (Zygapophyseal) joint DJD (Hypertrophy, space narrowing, subchondral sclerosis, and/or osteophyte formation) 2. DDD and/or IVDD (Loss of disc height, desiccation or "Black disc  disease") 3. Pars defects 4. Spondylolisthesis, spondylosis, and/or spondyloarthropathies (include Degree/Grade of displacement in mm) 5. Vertebral body Fractures, including age (old, new/acute) 66. Modic Type Changes 7. Demineralization 8. Bone pathology 9. Central, Lateral Recess, and/or Foraminal Stenosis (include AP diameter of stenosis in mm) 10. Surgical changes (hardware type, status, and presence of fibrosis) NOTE: Please specify level(s) and laterality. If applicable: Please indicate ROM and/or evidence of instability (>2mm displacement between flexion and extension views)    Standing Status:   Future    Standing Expiration Date:   06/13/2020    Scheduling Instructions:     Please evaluate for any evidence of cervical spine instability. Describe the presence of any spondylolisthesis (Antero- or retrolisthesis). If present, provide displacement "Grade" and measurement in cm. Please describe presence and specific location (Level & Laterality) of any signs of      osteoarthritis, zygapophyseal (Facet) joints DJD (including decreased joint space and/or osteophytosis), DDD, Foraminal narrowing, as well as any sclerosis and/or cyst formation. Please comment on ROM.    Order Specific Question:   Reason for Exam (SYMPTOM  OR DIAGNOSIS REQUIRED)    Answer:   Cervicalgia    Order Specific Question:   Is patient pregnant?    Answer:   No    Order Specific Question:   Preferred imaging location?    Answer:   [facility]    Order Specific Question:   Call Results- Best Contact Number?    Answer:   [phone] ([facility])    Order Specific Question:   Radiology Contrast Protocol - do NOT remove file path    Answer:   [email or web address]   Informed Consent Details: Physician/Practitioner Attestation; Transcribe to consent form and obtain patient signature    Provider Attestation: I, Coamo [name], MD, (Pain Management Specialist), the  physician/practitioner, attest that I have discussed with the patient the benefits, risks, side effects, alternatives, likelihood of achieving goals and  potential problems during recovery for the procedure that I have provided informed consent.    Scheduling Instructions:     Procedure: Therapeutic, bilateral, intra-articular Hyalgan knee injection     Indications: Chronic bilateral knee pain secondary to primary osteoarthritis of the knee     Note: Always confirm laterality of pain with Ms. Brigitte Pulse, before procedure.     Transcribe to consent form and obtain patient signature.   Provide equipment / supplies at bedside    Equipment required: Single use, disposable, "Block Tray"    Standing Status:   Standing    Number of Occurrences:   1    Order Specific Question:   Specify    Answer:   Block Tray   Miscellanous precautions    NOTE: Although It is true that patients can have allergies to shellfish and that shellfish contain iodine, most shellfish  allergies are due to two protein allergens present in the shellfish: tropomyosins and parvalbumin. Not all patients with shellfish allergies are allergic to iodine. However, as a precaution, avoid using iodine containing products.    Standing Status:   Standing    Number of Occurrences:   1   Chronic Opioid Analgesic:  Oxycodone IR 5 mg 1 tab PO 5X/day (#150/mo) (25 mg/day) MME/day:37.5 mg/day.   Medications ordered for procedure: Meds ordered this encounter  Medications   lidocaine (XYLOCAINE) 2 % (with pres) injection 400 mg   lactated ringers infusion 1,000 mL   midazolam (VERSED) 5 MG/5ML injection 1-2 mg    Make sure Flumazenil is available in the pyxis when using this medication. If oversedation occurs, administer 0.2 mg IV over 15 sec. If after 45 sec no response, administer 0.2 mg again over 1 min; may repeat at 1 min intervals; not to exceed 4 doses (1 mg)   fentaNYL (SUBLIMAZE) injection 25-50 mcg    Make sure Narcan is available  in the pyxis when using this medication. In the event of respiratory depression (RR< 8/min): Titrate NARCAN (naloxone) in increments of 0.1 to 0.2 mg IV at 2-3 minute intervals, until desired degree of reversal.   lidocaine (PF) (XYLOCAINE) 1 % injection 5 mL   ropivacaine (PF) 2 mg/mL (0.2%) (NAROPIN) injection 5 mL   Sodium Hyaluronate SOSY 2 mL   Sodium Hyaluronate SOSY 2 mL   ceFAZolin (ANCEF) IVPB 1 g/50 mL premix    Order Specific Question:   Antibiotic Indication:    Answer:   Surgical Prophylaxis    Order Specific Question:   Other Indication:    Answer:   Surgical Prophylaxis   Medications administered: We administered lidocaine, lactated ringers, midazolam, fentaNYL, lidocaine (PF), ropivacaine (PF) 2 mg/mL (0.2%), Sodium Hyaluronate, Sodium Hyaluronate, and ceFAZolin.  See the medical record for exact dosing, route, and time of administration.  Follow-up plan:   Return in about 2 weeks (around 03/27/2020) for Procedure (w/ sedation): (B) Hyalgan #3 (Fluoro needed).       Considering:  NOTE: NO RFA until BMI less or equal to 35.    Palliative PRN treatment(s):  Palliative bilateral IA Hyalgan knee injections  Palliative bilateral genicular NB  Palliative bilateral lumbar facet blocks       Recent Visits Date Type Provider Dept  02/19/20 Procedure visit Milinda Pointer, MD [facility]  Showing recent visits within past 90 days and meeting all other requirements Today's Visits Date Type Provider Dept  03/13/20 Procedure visit Milinda Pointer, MD [facility]  Showing today's visits and meeting all other  requirements Future Appointments Date Type Provider Dept  06/11/20 Appointment Milinda Pointer, MD [facility]  Showing future appointments within next 90 days and meeting all other requirements  Disposition: Discharge home  Discharge (Date   Time): 03/13/2020; 1148 hrs.   Primary Care Physician: Idelle Crouch,  MD Location: [facility] Note by: Gaspar Cola, MD Date: 03/13/2020; Time: 1:17 PM  Disclaimer:  Medicine is not an Chief Strategy Officer. The only guarantee in medicine is that nothing is guaranteed. It is important to note that the decision to proceed with this intervention was based on the information collected from the patient. The Data and conclusions were drawn from the patient's questionnaire, the interview, and the physical examination. Because the information was provided in large part by the patient, it cannot be guaranteed that it has not been purposely or unconsciously manipulated. Every effort has been made to obtain as much relevant data as possible for this evaluation. It is important to note that the conclusions that lead to this procedure are derived in large part from the available data. Always take into account that the treatment will also be dependent on availability of resources and existing treatment guidelines, considered by other Pain Management Practitioners as being common knowledge and practice, at the time of the intervention. For Medico-Legal purposes, it is also important to point out that variation in procedural techniques and pharmacological choices are the acceptable norm. The indications, contraindications, technique, and results of the above procedure should only be interpreted and judged by a Board-Certified Interventional Pain Specialist with extensive familiarity and expertise in the same exact procedure and technique.

## 2020-03-13 ENCOUNTER — Ambulatory Visit (HOSPITAL_BASED_OUTPATIENT_CLINIC_OR_DEPARTMENT_OTHER): Payer: Medicare HMO | Admitting: Pain Medicine

## 2020-03-13 ENCOUNTER — Encounter: Payer: Self-pay | Admitting: Pain Medicine

## 2020-03-13 ENCOUNTER — Ambulatory Visit
Admission: RE | Admit: 2020-03-13 | Discharge: 2020-03-13 | Disposition: A | Discharge status: Home / Self Care | Payer: Medicare HMO | Source: Ambulatory Visit | Attending: Pain Medicine | Admitting: Pain Medicine

## 2020-03-13 ENCOUNTER — Other Ambulatory Visit: Payer: Self-pay

## 2020-03-13 VITALS — BP 110/85 | HR 87 | Temp 97.3°F | Resp 17 | Ht 64.0 in | Wt 374.6 lb

## 2020-03-13 DIAGNOSIS — M25562 Pain in left knee: Secondary | ICD-10-CM

## 2020-03-13 DIAGNOSIS — Z6841 Body Mass Index (BMI) 40.0 and over, adult: Secondary | ICD-10-CM | POA: Diagnosis not present

## 2020-03-13 DIAGNOSIS — G8929 Other chronic pain: Secondary | ICD-10-CM

## 2020-03-13 DIAGNOSIS — M25561 Pain in right knee: Secondary | ICD-10-CM | POA: Diagnosis not present

## 2020-03-13 DIAGNOSIS — R200 Anesthesia of skin: Secondary | ICD-10-CM | POA: Insufficient documentation

## 2020-03-13 DIAGNOSIS — Z96641 Presence of right artificial hip joint: Secondary | ICD-10-CM | POA: Insufficient documentation

## 2020-03-13 DIAGNOSIS — M5412 Radiculopathy, cervical region: Secondary | ICD-10-CM | POA: Insufficient documentation

## 2020-03-13 DIAGNOSIS — M25512 Pain in left shoulder: Secondary | ICD-10-CM | POA: Insufficient documentation

## 2020-03-13 DIAGNOSIS — M174 Other bilateral secondary osteoarthritis of knee: Secondary | ICD-10-CM | POA: Insufficient documentation

## 2020-03-13 DIAGNOSIS — M79602 Pain in left arm: Secondary | ICD-10-CM | POA: Diagnosis not present

## 2020-03-13 MED ORDER — LIDOCAINE HCL (PF) 1 % IJ SOLN
5.0000 mL | Freq: Once | INTRAMUSCULAR | Status: AC
  Administered 2020-03-13: 5 mL
  Filled 2020-03-13: qty 5

## 2020-03-13 MED ORDER — SODIUM HYALURONATE (VISCOSUP) 20 MG/2ML IX SOSY
2.0000 mL | PREFILLED_SYRINGE | Freq: Once | INTRA_ARTICULAR | Status: AC
  Administered 2020-03-13: 2 mL via INTRA_ARTICULAR

## 2020-03-13 MED ORDER — MIDAZOLAM HCL 5 MG/5ML IJ SOLN
1.0000 mg | INTRAMUSCULAR | Status: DC | PRN
  Administered 2020-03-13: 2 mg via INTRAVENOUS
  Filled 2020-03-13: qty 5

## 2020-03-13 MED ORDER — ROPIVACAINE HCL 2 MG/ML IJ SOLN
5.0000 mL | Freq: Once | INTRAMUSCULAR | Status: AC
  Administered 2020-03-13: 5 mL via INTRA_ARTICULAR
  Filled 2020-03-13: qty 10

## 2020-03-13 MED ORDER — FENTANYL CITRATE (PF) 100 MCG/2ML IJ SOLN
25.0000 ug | INTRAMUSCULAR | Status: DC | PRN
  Administered 2020-03-13: 50 ug via INTRAVENOUS
  Filled 2020-03-13: qty 2

## 2020-03-13 MED ORDER — CEFAZOLIN SODIUM-DEXTROSE 1-4 GM/50ML-% IV SOLN
1.0000 g | Freq: Once | INTRAVENOUS | Status: AC
  Administered 2020-03-13: 1 g via INTRAVENOUS

## 2020-03-13 MED ORDER — LACTATED RINGERS IV SOLN
1000.0000 mL | Freq: Once | INTRAVENOUS | Status: AC
  Administered 2020-03-13: 1000 mL via INTRAVENOUS

## 2020-03-13 MED ORDER — LIDOCAINE HCL 2 % IJ SOLN
20.0000 mL | Freq: Once | INTRAMUSCULAR | Status: AC
  Administered 2020-03-13: 400 mg

## 2020-03-13 NOTE — Progress Notes (Signed)
Safety precautions to be maintained throughout the outpatient stay will include: orient to surroundings, keep bed in low position, maintain call bell within reach at all times, provide assistance with transfer out of bed and ambulation.  

## 2020-03-13 NOTE — Patient Instructions (Signed)
____________________________________________________________________________________________  Post-Procedure Discharge Instructions  Instructions:  Apply ice:   Purpose: This will minimize any swelling and discomfort after procedure.   When: Day of procedure, as soon as you get home.  How: Fill a plastic sandwich bag with crushed ice. Cover it with a small towel and apply to injection site.  How long: (15 min on, 15 min off) Apply for 15 minutes then remove x 15 minutes.  Repeat sequence on day of procedure, until you go to bed.  Apply heat:   Purpose: To treat any soreness and discomfort from the procedure.  When: Starting the next day after the procedure.  How: Apply heat to procedure site starting the day following the procedure.  How long: May continue to repeat daily, until discomfort goes away.  Food intake: Start with clear liquids (like water) and advance to regular food, as tolerated.   Physical activities: Keep activities to a minimum for the first 8 hours after the procedure. After that, then as tolerated.  Driving: If you have received any sedation, be responsible and do not drive. You are not allowed to drive for 24 hours after having sedation.  Blood thinner: (Applies only to those taking blood thinners) You may restart your blood thinner 6 hours after your procedure.  Insulin: (Applies only to Diabetic patients taking insulin) As soon as you can eat, you may resume your normal dosing schedule.  Infection prevention: Keep procedure site clean and dry. Shower daily and clean area with soap and water.  Post-procedure Pain Diary: Extremely important that this be done correctly and accurately. Recorded information will be used to determine the next step in treatment. For the purpose of accuracy, follow these rules:  Evaluate only the area treated. Do not report or include pain from an untreated area. For the purpose of this evaluation, ignore all other areas of pain,  except for the treated area.  After your procedure, avoid taking a long nap and attempting to complete the pain diary after you wake up. Instead, set your alarm clock to go off every hour, on the hour, for the initial 8 hours after the procedure. Document the duration of the numbing medicine, and the relief you are getting from it.  Do not go to sleep and attempt to complete it later. It will not be accurate. If you received sedation, it is likely that you were given a medication that may cause amnesia. Because of this, completing the diary at a later time may cause the information to be inaccurate. This information is needed to plan your care.  Follow-up appointment: Keep your post-procedure follow-up evaluation appointment after the procedure (usually 2 weeks for most procedures, 6 weeks for radiofrequencies). DO NOT FORGET to bring you pain diary with you.   Expect: (What should I expect to see with my procedure?)  From numbing medicine (AKA: Local Anesthetics): Numbness or decrease in pain. You may also experience some weakness, which if present, could last for the duration of the local anesthetic.  Onset: Full effect within 15 minutes of injected.  Duration: It will depend on the type of local anesthetic used. On the average, 1 to 8 hours.   From steroids (Applies only if steroids were used): Decrease in swelling or inflammation. Once inflammation is improved, relief of the pain will follow.  Onset of benefits: Depends on the amount of swelling present. The more swelling, the longer it will take for the benefits to be seen. In some cases, up to 10 days.  Duration: Steroids will stay in the system x 2 weeks. Duration of benefits will depend on multiple posibilities including persistent irritating factors.  Side-effects: If present, they may typically last 2 weeks (the duration of the steroids).  Frequent: Cramps (if they occur, drink Gatorade and take over-the-counter Magnesium 450-500 mg  once to twice a day); water retention with temporary weight gain; increases in blood sugar; decreased immune system response; increased appetite.  Occasional: Facial flushing (red, warm cheeks); mood swings; menstrual changes.  Uncommon: Long-term decrease or suppression of natural hormones; bone thinning. (These are more common with higher doses or more frequent use. This is why we prefer that our patients avoid having any injection therapies in other practices.)   Very Rare: Severe mood changes; psychosis; aseptic necrosis.  From procedure: Some discomfort is to be expected once the numbing medicine wears off. This should be minimal if ice and heat are applied as instructed.  Call if: (When should I call?)  You experience numbness and weakness that gets worse with time, as opposed to wearing off.  New onset bowel or bladder incontinence. (Applies only to procedures done in the spine)  Emergency Numbers:  Durning business hours (Monday - Thursday, 8:00 AM - 4:00 PM) (Friday, 9:00 AM - 12:00 Noon): [phone]  After hours: [phone]  NOTE: If you are having a problem and are unable connect with, or to talk to a provider, then go to your nearest urgent care or emergency department. If the problem is serious and urgent, please call 911. ____________________________________________________________________________________________    ______________________________________________________________________________________________  Weight Management Required  URGENT: Your weight has been found to be adversely affecting your health.  Dear Ms. Beswick:  Your current Estimated body mass index is 64.54 kg/m as calculated from the following:   Height as of 02/19/20: _0  (1.626 m).   Weight as of 02/19/20: 376 lb (170.6 kg).  Please use the table below to identify your weight category and associated incidence of chronic pain, secondary to your weight.  Body Mass Index (BMI)  Classification BMI level (kg/m2) Category Associated incidence of chronic pain  <18  Underweight   18.5-24.9 Ideal body weight   25-29.9 Overweight  20%  30-34.9 Obese (Class I)  68%  35-39.9 Severe obesity (Class II)  136%  >40 Extreme obesity (Class III)  254%   In addition: You will be considered "Morbidly Obese", if your BMI is above 30 and you have one or more of the following conditions which are known to be caused and/or directly associated with obesity: 1.    Type 2 Diabetes (Which in turn can lead to cardiovascular diseases (CVD), stroke, peripheral vascular diseases (PVD), retinopathy, nephropathy, and neuropathy) 2.    Cardiovascular Disease (High Blood Pressure; Congestive Heart Failure; High Cholesterol; Coronary Artery Disease; Angina; or History of Heart Attacks) 3.    Breathing problems (Asthma; obesity-hypoventilation syndrome; obstructive sleep apnea; chronic inflammatory airway disease; reactive airway disease; or shortness of breath) 4.    Chronic kidney disease 5.    Liver disease (nonalcoholic fatty liver disease) 6.    High blood pressure 7.    Acid reflux (gastroesophageal reflux disease; heartburn) 8.    Osteoarthritis (OA) (with any of the following: hip pain; knee pain; and/or low back pain) 9.    Low back pain (Lumbar Facet Syndrome; and/or Degenerative Disc Disease) 10.  Hip pain (Osteoarthritis of hip) (For every 1 lbs of added body weight, there is a 2 lbs increase in pressure inside  of each hip articulation. 1:2 mechanical relationship) 11.  Knee pain (Osteoarthritis of knee) (For every 1 lbs of added body weight, there is a 4 lbs increase in pressure inside of each knee articulation. 1:4 mechanical relationship) (patients with a BMI>30 kg/m2 were 6.8 times more likely to develop knee OA than normal-weight individuals) 12.  Cancer: Epidemiological studies have shown that obesity is a risk factor for: post-menopausal breast cancer; cancers of the endometrium, colon  and kidney cancer; malignant adenomas of the oesophagus. Obese subjects have an approximately 1.5-3.5-fold increased risk of developing these cancers compared with normal-weight subjects, and it has been estimated that between 15 and 45% of these cancers can be attributed to overweight. More recent studies suggest that obesity may also increase the risk of other types of cancer, including pancreatic, hepatic and gallbladder cancer. (Ref: Obesity and cancer. Pischon T, Nthlings U, [employer] H. Proc Nutr Soc. 2008 May;67(2):128-45. doi: 31.5400/Q6761950932671245.) The International Agency for Research on Cancer (IARC) has identified 13 cancers associated with overweight and obesity: meningioma, multiple myeloma, adenocarcinoma of the esophagus, and cancers of the thyroid, postmenopausal breast cancer, gallbladder, stomach, liver, pancreas, kidney, ovaries, uterus, colon and rectal (colorectal) cancers. 62 percent of all cancers diagnosed in women and 24 percent of those diagnosed in men are associated with overweight and obesity.  Recommendation: At this point it is urgent that you take a step back and concentrate in loosing weight. Dedicate 100% of your efforts on this task. Nothing else will improve your health more than bringing your weight down and your BMI to less than 30. If you are here, you probably have chronic pain. Because most chronic pain patients have difficulty exercising secondary to their pain, you must rely on proper nutrition and diet in order to lose the weight. If your BMI is above 40, you should seriously consider bariatric surgery. A realistic goal is to lose 10% of your body weight over a period of 12 months.  Be honest to yourself, if over time you have unsuccessfully tried to lose weight, then it is time for you to seek professional help and to enter a medically supervised weight management program, and/or undergo bariatric surgery. Stop procrastinating.   Pain management considerations:   1.    Pharmacological Problems: Be advised that the use of opioid analgesics (oxycodone; hydrocodone; morphine; methadone; codeine; and all of their derivatives) have been associated with decreased metabolism and weight gain.  For this reason, should we see that you are unable to lose weight while taking these medications, it may become necessary for [place] to taper down and indefinitely discontinue them.  2.    Technical Problems: The incidence of successful interventional therapies decreases as the patient's BMI increases. It is much more difficult to accomplish a safe and effective interventional therapy on a patient with a BMI above 35. 3.    Radiation Exposure Problems: The x-rays machine, used to accomplish injection therapies, will automatically increase their x-ray output in order to capture an appropriate bone image. This means that radiation exposure increases exponentially with the patient's BMI. (The higher the BMI, the higher the radiation exposure.) Although the level of radiation used at a given time is still safe to the patient, it is not for the physician and/or assisting staff. Unfortunately, radiation exposure is accumulative. Because physicians and the staff have to do procedures and be exposed on a daily basis, this can result in health problems such as cancer and radiation burns. Radiation exposure to the staff is monitored  by the radiation batches that they wear. The exposure levels are reported back to the staff on a quarterly basis. Depending on levels of exposure, physicians and staff may be obligated by law to decrease this exposure. This means that they have the right and obligation to refuse providing therapies where they may be overexposed to radiation. For this reason, physicians may decline to offer therapies such as radiofrequency ablation or implants to patients with a BMI above 40. 4.    Current Trends: Be advised that the current trend is to no longer offer certain therapies to  patients with a BMI equal to, or above 35, due to increase perioperative risks, increased technical procedural difficulties, and excessive radiation exposure to healthcare personnel.  ______________________________________________________________________________________________    ______________________________________________________________________________________________  Weight Management Required  URGENT: Your weight has been found to be adversely affecting your health.  Dear Karen Lewis:  Your current Estimated body mass index is 64.54 kg/m as calculated from the following:   Height as of 02/19/20: _0  (1.626 m).   Weight as of 02/19/20: 376 lb (170.6 kg).  Please use the table below to identify your weight category and associated incidence of chronic pain, secondary to your weight.  Body Mass Index (BMI) Classification BMI level (kg/m2) Category Associated incidence of chronic pain  <18  Underweight   18.5-24.9 Ideal body weight   25-29.9 Overweight  20%  30-34.9 Obese (Class I)  68%  35-39.9 Severe obesity (Class II)  136%  >40 Extreme obesity (Class III)  254%   In addition: You will be considered "Morbidly Obese", if your BMI is above 30 and you have one or more of the following conditions which are known to be caused and/or directly associated with obesity: 1.    Type 2 Diabetes (Which in turn can lead to cardiovascular diseases (CVD), stroke, peripheral vascular diseases (PVD), retinopathy, nephropathy, and neuropathy) 2.    Cardiovascular Disease (High Blood Pressure; Congestive Heart Failure; High Cholesterol; Coronary Artery Disease; Angina; or History of Heart Attacks) 3.    Breathing problems (Asthma; obesity-hypoventilation syndrome; obstructive sleep apnea; chronic inflammatory airway disease; reactive airway disease; or shortness of breath) 4.    Chronic kidney disease 5.    Liver disease (nonalcoholic fatty liver disease) 6.    High blood pressure 7.    Acid  reflux (gastroesophageal reflux disease; heartburn) 8.    Osteoarthritis (OA) (with any of the following: hip pain; knee pain; and/or low back pain) 9.    Low back pain (Lumbar Facet Syndrome; and/or Degenerative Disc Disease) 10.  Hip pain (Osteoarthritis of hip) (For every 1 lbs of added body weight, there is a 2 lbs increase in pressure inside of each hip articulation. 1:2 mechanical relationship) 11.  Knee pain (Osteoarthritis of knee) (For every 1 lbs of added body weight, there is a 4 lbs increase in pressure inside of each knee articulation. 1:4 mechanical relationship) (patients with a BMI>30 kg/m2 were 6.8 times more likely to develop knee OA than normal-weight individuals) 12.  Cancer: Epidemiological studies have shown that obesity is a risk factor for: post-menopausal breast cancer; cancers of the endometrium, colon and kidney cancer; malignant adenomas of the oesophagus. Obese subjects have an approximately 1.5-3.5-fold increased risk of developing these cancers compared with normal-weight subjects, and it has been estimated that between 15 and 45% of these cancers can be attributed to overweight. More recent studies suggest that obesity may also increase the risk of other types of cancer, including pancreatic, hepatic  and gallbladder cancer. (Ref: Obesity and cancer. Pischon T, Nthlings U, [employer] H. Proc Nutr Soc. 2008 May;67(2):128-45. doi: 75.3005/[id number].) The International Agency for Research on Cancer (IARC) has identified 13 cancers associated with overweight and obesity: meningioma, multiple myeloma, adenocarcinoma of the esophagus, and cancers of the thyroid, postmenopausal breast cancer, gallbladder, stomach, liver, pancreas, kidney, ovaries, uterus, colon and rectal (colorectal) cancers. 14 percent of all cancers diagnosed in women and 24 percent of those diagnosed in men are associated with overweight and obesity.  Recommendation: At this point it is urgent that you take a  step back and concentrate in loosing weight. Dedicate 100% of your efforts on this task. Nothing else will improve your health more than bringing your weight down and your BMI to less than 30. If you are here, you probably have chronic pain. Because most chronic pain patients have difficulty exercising secondary to their pain, you must rely on proper nutrition and diet in order to lose the weight. If your BMI is above 40, you should seriously consider bariatric surgery. A realistic goal is to lose 10% of your body weight over a period of 12 months.  Be honest to yourself, if over time you have unsuccessfully tried to lose weight, then it is time for you to seek professional help and to enter a medically supervised weight management program, and/or undergo bariatric surgery. Stop procrastinating.   Pain management considerations:  1.    Pharmacological Problems: Be advised that the use of opioid analgesics (oxycodone; hydrocodone; morphine; methadone; codeine; and all of their derivatives) have been associated with decreased metabolism and weight gain.  For this reason, should we see that you are unable to lose weight while taking these medications, it may become necessary for [place] to taper down and indefinitely discontinue them.  2.    Technical Problems: The incidence of successful interventional therapies decreases as the patient's BMI increases. It is much more difficult to accomplish a safe and effective interventional therapy on a patient with a BMI above 35. 3.    Radiation Exposure Problems: The x-rays machine, used to accomplish injection therapies, will automatically increase their x-ray output in order to capture an appropriate bone image. This means that radiation exposure increases exponentially with the patient's BMI. (The higher the BMI, the higher the radiation exposure.) Although the level of radiation used at a given time is still safe to the patient, it is not for the physician and/or assisting  staff. Unfortunately, radiation exposure is accumulative. Because physicians and the staff have to do procedures and be exposed on a daily basis, this can result in health problems such as cancer and radiation burns. Radiation exposure to the staff is monitored by the radiation batches that they wear. The exposure levels are reported back to the staff on a quarterly basis. Depending on levels of exposure, physicians and staff may be obligated by law to decrease this exposure. This means that they have the right and obligation to refuse providing therapies where they may be overexposed to radiation. For this reason, physicians may decline to offer therapies such as radiofrequency ablation or implants to patients with a BMI above 40. 4.    Current Trends: Be advised that the current trend is to no longer offer certain therapies to patients with a BMI equal to, or above 35, due to increase perioperative risks, increased technical procedural difficulties, and excessive radiation exposure to healthcare personnel.  ______________________________________________________________________________________________

## 2020-03-14 ENCOUNTER — Telehealth: Payer: Self-pay

## 2020-03-14 DIAGNOSIS — M797 Fibromyalgia: Secondary | ICD-10-CM | POA: Diagnosis not present

## 2020-03-14 DIAGNOSIS — G894 Chronic pain syndrome: Secondary | ICD-10-CM | POA: Diagnosis not present

## 2020-03-14 NOTE — Telephone Encounter (Signed)
Post procedure phone call.  Patient states she is doing well, just a little sore.

## 2020-03-21 DIAGNOSIS — Z03818 Encounter for observation for suspected exposure to other biological agents ruled out: Secondary | ICD-10-CM | POA: Diagnosis not present

## 2020-03-21 DIAGNOSIS — B9689 Other specified bacterial agents as the cause of diseases classified elsewhere: Secondary | ICD-10-CM | POA: Diagnosis not present

## 2020-03-21 DIAGNOSIS — J029 Acute pharyngitis, unspecified: Secondary | ICD-10-CM | POA: Diagnosis not present

## 2020-03-21 DIAGNOSIS — J019 Acute sinusitis, unspecified: Secondary | ICD-10-CM | POA: Diagnosis not present

## 2020-03-21 DIAGNOSIS — Z20822 Contact with and (suspected) exposure to covid-19: Secondary | ICD-10-CM | POA: Diagnosis not present

## 2020-03-26 ENCOUNTER — Ambulatory Visit: Payer: Medicare HMO

## 2020-03-26 ENCOUNTER — Ambulatory Visit
Admission: RE | Admit: 2020-03-26 | Discharge: 2020-03-26 | Disposition: A | Discharge status: Home / Self Care | Payer: Medicare HMO | Source: Ambulatory Visit | Attending: Pain Medicine | Admitting: Pain Medicine

## 2020-03-26 DIAGNOSIS — G4733 Obstructive sleep apnea (adult) (pediatric): Secondary | ICD-10-CM | POA: Insufficient documentation

## 2020-03-26 DIAGNOSIS — M50322 Other cervical disc degeneration at C5-C6 level: Secondary | ICD-10-CM | POA: Diagnosis not present

## 2020-03-26 DIAGNOSIS — M79602 Pain in left arm: Secondary | ICD-10-CM | POA: Insufficient documentation

## 2020-03-26 DIAGNOSIS — G4761 Periodic limb movement disorder: Secondary | ICD-10-CM | POA: Insufficient documentation

## 2020-03-26 DIAGNOSIS — M5412 Radiculopathy, cervical region: Secondary | ICD-10-CM

## 2020-03-26 DIAGNOSIS — R200 Anesthesia of skin: Secondary | ICD-10-CM | POA: Insufficient documentation

## 2020-03-26 DIAGNOSIS — G8929 Other chronic pain: Secondary | ICD-10-CM | POA: Insufficient documentation

## 2020-03-26 DIAGNOSIS — M50321 Other cervical disc degeneration at C4-C5 level: Secondary | ICD-10-CM | POA: Diagnosis not present

## 2020-03-26 DIAGNOSIS — M25512 Pain in left shoulder: Secondary | ICD-10-CM | POA: Insufficient documentation

## 2020-03-26 DIAGNOSIS — E119 Type 2 diabetes mellitus without complications: Secondary | ICD-10-CM | POA: Diagnosis not present

## 2020-03-26 DIAGNOSIS — I152 Hypertension secondary to endocrine disorders: Secondary | ICD-10-CM | POA: Diagnosis not present

## 2020-03-26 DIAGNOSIS — M50323 Other cervical disc degeneration at C6-C7 level: Secondary | ICD-10-CM | POA: Diagnosis not present

## 2020-03-26 DIAGNOSIS — E559 Vitamin D deficiency, unspecified: Secondary | ICD-10-CM | POA: Diagnosis not present

## 2020-03-26 DIAGNOSIS — R300 Dysuria: Secondary | ICD-10-CM | POA: Diagnosis not present

## 2020-03-26 DIAGNOSIS — E1159 Type 2 diabetes mellitus with other circulatory complications: Secondary | ICD-10-CM | POA: Diagnosis not present

## 2020-03-27 ENCOUNTER — Ambulatory Visit: Payer: Medicare HMO | Admitting: Pain Medicine

## 2020-03-27 ENCOUNTER — Other Ambulatory Visit: Payer: Self-pay

## 2020-03-30 DIAGNOSIS — G4730 Sleep apnea, unspecified: Secondary | ICD-10-CM | POA: Diagnosis not present

## 2020-04-02 DIAGNOSIS — B373 Candidiasis of vulva and vagina: Secondary | ICD-10-CM | POA: Diagnosis not present

## 2020-04-02 DIAGNOSIS — N390 Urinary tract infection, site not specified: Secondary | ICD-10-CM | POA: Diagnosis not present

## 2020-04-02 DIAGNOSIS — R399 Unspecified symptoms and signs involving the genitourinary system: Secondary | ICD-10-CM | POA: Diagnosis not present

## 2020-04-03 ENCOUNTER — Ambulatory Visit
Admission: RE | Admit: 2020-04-03 | Discharge: 2020-04-03 | Disposition: A | Discharge status: Home / Self Care | Payer: Medicare HMO | Source: Ambulatory Visit | Attending: Ophthalmology | Admitting: Ophthalmology

## 2020-04-03 DIAGNOSIS — G932 Benign intracranial hypertension: Secondary | ICD-10-CM | POA: Diagnosis not present

## 2020-04-03 DIAGNOSIS — J3489 Other specified disorders of nose and nasal sinuses: Secondary | ICD-10-CM | POA: Diagnosis not present

## 2020-04-03 DIAGNOSIS — H532 Diplopia: Secondary | ICD-10-CM

## 2020-04-03 MED ORDER — GADOBENATE DIMEGLUMINE 529 MG/ML IV SOLN
20.0000 mL | Freq: Once | INTRAVENOUS | Status: AC | PRN
  Administered 2020-04-03: 20 mL via INTRAVENOUS

## 2020-04-08 ENCOUNTER — Other Ambulatory Visit: Payer: Self-pay

## 2020-04-08 ENCOUNTER — Ambulatory Visit: Payer: Medicare HMO | Admitting: Pain Medicine

## 2020-04-08 NOTE — Patient Outreach (Signed)
[facility]) Care Management  [place]  04/08/2020   Karen Lewis Apr 14, 1971 [id number]  Subjective: Telephone call to patient for disease management follow up. Patient reports she has had some problems. She reports that her trigger finger surgery did not go well and that she will have another procedure next month.  She reports that her A1c was 8.4 and Jardiance was added.  Discussed diabetes and control of diabetes.  She verbalized understanding.    Objective:   Encounter Medications:  Outpatient Encounter Medications as of 04/08/2020  Medication Sig  . ACCU-CHEK AVIVA PLUS test strip   . acetaminophen (TYLENOL) 650 MG CR tablet Take 650-1,300 mg by mouth every 8 (eight) hours as needed for pain.  Marland Kitchen acidophilus (RISAQUAD) CAPS capsule Take 1 capsule by mouth daily.  Marland Kitchen albuterol (PROVENTIL HFA;VENTOLIN HFA) 108 (90 BASE) MCG/ACT inhaler Inhale 2 puffs into the lungs every 6 (six) hours as needed for wheezing or shortness of breath.  Marland Kitchen amitriptyline (ELAVIL) 100 MG tablet Take 100 mg by mouth at bedtime.  . Ascorbic Acid (VITAMIN C) 1000 MG tablet Take 1,000 mg by mouth daily.   . Biotin 10 MG CAPS Take 10 mg by mouth daily.   . butalbital-acetaminophen-caffeine (FIORICET) 50-325-40 MG tablet Take 1 tablet by mouth every 6 (six) hours as needed for migraine.   . calcium carbonate (TUMS EX) 750 MG chewable tablet Chew 1 tablet by mouth daily.   . clonazePAM (KLONOPIN) 0.5 MG tablet Take 1 tablet (0.5 mg total) by mouth 3 (three) times daily as needed for anxiety.  . Cyanocobalamin (B-12) 2500 MCG TABS Take 2,500 mcg by mouth daily.  . diclofenac sodium (VOLTAREN) 1 % GEL Apply 2 g topically 4 (four) times daily.  . diclofenac Sodium (VOLTAREN) 1 % GEL  (Patient not taking: Reported on 03/13/2020)  . diphenhydrAMINE (BENADRYL) 25 MG tablet Take 25 mg by mouth every 8 (eight) hours as needed for itching or allergies.   Marland Kitchen EPINEPHrine 0.3 mg/0.3 mL IJ SOAJ injection  Inject 0.3 mg into the muscle as needed for anaphylaxis.   Marland Kitchen ergocalciferol (VITAMIN D2) 1.25 MG (50000 UT) capsule Take 50,000 Units by mouth 2 (two) times a week.   . famotidine (PEPCID) 20 MG tablet Take 20 mg by mouth daily as needed for heartburn or indigestion.   Marland Kitchen glucose blood (ACCU-CHEK AVIVA PLUS) test strip Use 2 (two) times daily  . levocetirizine (XYZAL) 5 MG tablet Take 5 mg by mouth at bedtime.   Marland Kitchen levothyroxine (SYNTHROID) 125 MCG tablet Take 250 mcg by mouth daily before breakfast.   . lisinopril (ZESTRIL) 5 MG tablet Take 5 mg by mouth at bedtime.   . magnesium oxide (MAG-OX) 400 MG tablet Take 800-1,200 mg by mouth See admin instructions. Take 1200 mg by mouth in the morning and 800 mg at bedtime  . meclizine (ANTIVERT) 25 MG tablet Take 25 mg by mouth 3 (three) times daily as needed for dizziness.   . medroxyPROGESTERone (DEPO-PROVERA) 150 MG/ML injection Inject 150 mg into the muscle every 3 (three) months.   . metFORMIN (GLUCOPHAGE) 1000 MG tablet Take 1,000 mg by mouth 2 (two) times daily with a meal.   . metoprolol tartrate (LOPRESSOR) 50 MG tablet Take 75 mg by mouth 2 (two) times daily.   . montelukast (SINGULAIR) 10 MG tablet Take 10 mg by mouth at bedtime.   . Multiple Vitamin (MULTI-VITAMIN) tablet Take 2 tablets by mouth daily.  . Multiple Vitamins-Minerals (TAB-A-VITE) TABS  Take by mouth. (Patient not taking: Reported on 03/13/2020)  . mupirocin ointment (BACTROBAN) 2 % APPLY TO WOUND DAILY.  . naloxone (NARCAN) nasal spray 4 mg/0.1 mL Spray into one nostril. Repeat with second device into other nostril after 2-3 minutes if no or minimal response. (Patient taking differently: Place 0.4 mg into the nose once. Spray into one nostril. Repeat with second device into other nostril after 2-3 minutes if no or minimal response.)  . naphazoline-pheniramine (NAPHCON-A) 0.025-0.3 % ophthalmic solution Place 1 drop into both eyes 4 (four) times daily as needed for eye irritation.   Marland Kitchen omeprazole (PRILOSEC) 40 MG capsule Take 40 mg by mouth in the morning and at bedtime.  Marland Kitchen oxyCODONE (OXY IR/ROXICODONE) 5 MG immediate release tablet Take 1 tablet (5 mg total) by mouth 5 (five) times daily. Must last 30 days  . [START ON 04/16/2020] oxyCODONE (OXY IR/ROXICODONE) 5 MG immediate release tablet Take 1 tablet (5 mg total) by mouth 5 (five) times daily. Must last 30 days  . [START ON 05/16/2020] oxyCODONE (OXY IR/ROXICODONE) 5 MG immediate release tablet Take 1 tablet (5 mg total) by mouth 5 (five) times daily. Must last 30 days  . pregabalin (LYRICA) 225 MG capsule Take 1 capsule (225 mg total) by mouth 2 (two) times daily.  . QUEtiapine (SEROQUEL) 100 MG tablet Take 100 mg by mouth at bedtime.  . rosuvastatin (CRESTOR) 40 MG tablet Take 40 mg by mouth daily.   . sertraline (ZOLOFT) 100 MG tablet Take 200 mg by mouth daily.   Marland Kitchen spironolactone (ALDACTONE) 25 MG tablet Take 25 mg by mouth 2 (two) times daily.   . SUPER B COMPLEX/C PO Take 1 tablet by mouth at bedtime.   Marland Kitchen tiZANidine (ZANAFLEX) 4 MG tablet Take 1 tablet (4 mg total) by mouth 2 (two) times daily as needed for muscle spasms.  Marland Kitchen triamcinolone (NASACORT AQ) 55 MCG/ACT AERO nasal inhaler Place 2 sprays into the nose 2 (two) times daily as needed (allergies).   . zolpidem (AMBIEN) 10 MG tablet Take 10 mg by mouth at bedtime as needed for sleep.  Marland Kitchen zonisamide (ZONEGRAN) 50 MG capsule Take 150 mg by mouth at bedtime.    No facility-administered encounter medications on file as of 04/08/2020.    Functional Status:  In your present state of health, do you have any difficulty performing the following activities: 12/14/2019 12/13/2019  Hearing? N N  Vision? N N  Difficulty concentrating or making decisions? N N  Comment - -  Walking or climbing stairs? Y Y  Comment Right Hip Replacement. recent right hip replacement  Dressing or bathing? Y Y  Comment Right Hip Replacement. recent right hip replacement  Doing errands, shopping?  Y Y  Comment Right Hip Replacement. recent hip replacement- fiance helps  Preparing Food and eating ? Y Y  Comment Right Hip Replacement. fiance helps  Using the Toilet? Y (No Data)  Comment Right Hip Replacement. fiance helps  In the past six months, have you accidently leaked urine? N N  Do you have problems with loss of bowel control? N N  Managing your Medications? N N  Managing your Finances? N N  Housekeeping or managing your Housekeeping? Y Y  Comment Right Hip Replacement. fiance helps  Some recent data might be hidden    Fall/Depression Screening: Fall Risk  04/08/2020 03/13/2020 12/14/2019  Falls in the past year? 0 0 0  Number falls in past yr: - - 0  Comment - - -  Injury with Fall? - - 0  Comment - - -  Risk Factor Category  - - -  Risk for fall due to : - - No Fall Risks  Risk for fall due to: Comment - - -  Follow up - - Education provided;Falls prevention discussed   PHQ 2/9 Scores 04/08/2020 03/13/2020 12/14/2019 12/13/2019 04/27/2018 04/13/2018 03/30/2018  PHQ - 2 Score 0 0 4 4 0 0 0  PHQ- 9 Score - - 12 12 - - -  Exception Documentation - - - - - - -    Assessment: Patient continues to manage chronic conditions.  Patient patient benefits from disease management follow up.  Plan:  [facility] CM Care Plan Problem One     Most Recent Value  Care Plan Problem One Knowledge deficit diabetes  Role Documenting the Problem One Care Management Telephonic Coordinator  Care Plan for Problem One Active  [facility] Long Term Goal  Over the next 90 days, patient will demonstrate and/or verbalize understanding of self-health management for long term care of Diabetes.   [facility] Long Term Goal Start Date 03/07/20  Interventions for Problem One Long Term Goal RN CM discussed with patient importance of blood sugar control and consequences of uncontrolled blood sugars.  Patient A1c 8.4.    [facility] CM Short Term Goal #2  Patient will keep A1c less than 7.0 within 30 days.  [facility] CM Short Term Goal #2 Start Date  04/08/20  Interventions for Short Term Goal #2 Patient A1c now 8.4. Patient trying to walk more and watch her diet.  Discused importance of controlling diabetes     RN CM will outreach again in the month of August and patient agreeable.   Jone Baseman, RN, MSN [place] [facility] Care Management Coordinator Direct Line [phone] Cell [phone] Toll Free: [phone]  Fax: [phone]

## 2020-04-11 ENCOUNTER — Other Ambulatory Visit: Payer: Self-pay | Admitting: Orthopedic Surgery

## 2020-04-14 DIAGNOSIS — M797 Fibromyalgia: Secondary | ICD-10-CM | POA: Diagnosis not present

## 2020-04-14 DIAGNOSIS — G894 Chronic pain syndrome: Secondary | ICD-10-CM | POA: Diagnosis not present

## 2020-04-16 ENCOUNTER — Inpatient Hospital Stay: Payer: Medicare HMO | Attending: Oncology

## 2020-04-16 ENCOUNTER — Telehealth: Payer: Self-pay | Admitting: Oncology

## 2020-04-16 ENCOUNTER — Other Ambulatory Visit: Payer: Self-pay

## 2020-04-16 DIAGNOSIS — D509 Iron deficiency anemia, unspecified: Secondary | ICD-10-CM | POA: Insufficient documentation

## 2020-04-16 DIAGNOSIS — E1169 Type 2 diabetes mellitus with other specified complication: Secondary | ICD-10-CM | POA: Diagnosis not present

## 2020-04-16 DIAGNOSIS — Z9884 Bariatric surgery status: Secondary | ICD-10-CM | POA: Insufficient documentation

## 2020-04-16 DIAGNOSIS — I890 Lymphedema, not elsewhere classified: Secondary | ICD-10-CM | POA: Diagnosis not present

## 2020-04-16 DIAGNOSIS — E890 Postprocedural hypothyroidism: Secondary | ICD-10-CM | POA: Diagnosis not present

## 2020-04-16 DIAGNOSIS — Z6841 Body Mass Index (BMI) 40.0 and over, adult: Secondary | ICD-10-CM | POA: Diagnosis not present

## 2020-04-16 DIAGNOSIS — R0600 Dyspnea, unspecified: Secondary | ICD-10-CM | POA: Diagnosis not present

## 2020-04-16 DIAGNOSIS — I10 Essential (primary) hypertension: Secondary | ICD-10-CM | POA: Diagnosis not present

## 2020-04-16 DIAGNOSIS — G4733 Obstructive sleep apnea (adult) (pediatric): Secondary | ICD-10-CM | POA: Diagnosis not present

## 2020-04-16 DIAGNOSIS — J4530 Mild persistent asthma, uncomplicated: Secondary | ICD-10-CM | POA: Diagnosis not present

## 2020-04-16 DIAGNOSIS — E785 Hyperlipidemia, unspecified: Secondary | ICD-10-CM | POA: Diagnosis not present

## 2020-04-16 LAB — CBC WITH DIFFERENTIAL/PLATELET
Abs Immature Granulocytes: 0.03 10*3/uL (ref 0.00–0.07)
Basophils Absolute: 0.1 10*3/uL (ref 0.0–0.1)
Basophils Relative: 1 %
Eosinophils Absolute: 0.3 10*3/uL (ref 0.0–0.5)
Eosinophils Relative: 2 %
HCT: 40.9 % (ref 36.0–46.0)
Hemoglobin: 13.5 g/dL (ref 12.0–15.0)
Immature Granulocytes: 0 %
Lymphocytes Relative: 21 %
Lymphs Abs: 2.6 10*3/uL (ref 0.7–4.0)
MCH: 27.1 pg (ref 26.0–34.0)
MCHC: 33 g/dL (ref 30.0–36.0)
MCV: 82 fL (ref 80.0–100.0)
Monocytes Absolute: 0.7 10*3/uL (ref 0.1–1.0)
Monocytes Relative: 6 %
Neutro Abs: 8.7 10*3/uL — ABNORMAL HIGH (ref 1.7–7.7)
Neutrophils Relative %: 70 %
Platelets: 273 10*3/uL (ref 150–400)
RBC: 4.99 MIL/uL (ref 3.87–5.11)
RDW: 16.2 % — ABNORMAL HIGH (ref 11.5–15.5)
WBC: 12.4 10*3/uL — ABNORMAL HIGH (ref 4.0–10.5)
nRBC: 0 % (ref 0.0–0.2)

## 2020-04-16 LAB — COMPREHENSIVE METABOLIC PANEL
ALT: 51 U/L — ABNORMAL HIGH (ref 0–44)
AST: 54 U/L — ABNORMAL HIGH (ref 15–41)
Albumin: 4.3 g/dL (ref 3.5–5.0)
Alkaline Phosphatase: 75 U/L (ref 38–126)
Anion gap: 11 (ref 5–15)
BUN: 19 mg/dL (ref 6–20)
CO2: 22 mmol/L (ref 22–32)
Calcium: 8.5 mg/dL — ABNORMAL LOW (ref 8.9–10.3)
Chloride: 104 mmol/L (ref 98–111)
Creatinine, Ser: 0.81 mg/dL (ref 0.44–1.00)
GFR calc Af Amer: >60 mL/min (ref >60)
GFR calc non Af Amer: >60 mL/min (ref >60)
Glucose, Bld: 135 mg/dL — ABNORMAL HIGH (ref 70–99)
Potassium: 4.5 mmol/L (ref 3.5–5.1)
Sodium: 137 mmol/L (ref 135–145)
Total Bilirubin: 0.6 mg/dL (ref 0.3–1.2)
Total Protein: 7.5 g/dL (ref 6.5–8.1)

## 2020-04-16 LAB — IRON AND TIBC
Iron: 106 ug/dL (ref 28–170)
Saturation Ratios: 22 % (ref 10.4–31.8)
TIBC: 482 ug/dL — ABNORMAL HIGH (ref 250–450)
UIBC: 376 ug/dL

## 2020-04-16 LAB — FOLATE: Folate: 96 ng/mL (ref >5.9)

## 2020-04-16 LAB — FERRITIN: Ferritin: 28 ng/mL (ref 11–307)

## 2020-04-16 LAB — VITAMIN B12: Vitamin B-12: 372 pg/mL (ref 180–914)

## 2020-04-16 NOTE — Telephone Encounter (Signed)
Ok to convert to [employer] visit on the days that are convenient for her Thanks

## 2020-04-16 NOTE — Telephone Encounter (Signed)
Pt called requesting to have her 8/6 appt moved to 8/9 or 8/10. She is scheduled for a procedure tomorrow and they want her to be on bed rest the following day. She also has a Covid test scheduled for 8/10 and wanted to come prior to that in the am. Her last request was if she could just have a Mychart visit because her labs looked good for one of her test, but she couldn't see the other one so she may not need treatment. I told the patient the patient I would notify the team of her request.

## 2020-04-17 ENCOUNTER — Ambulatory Visit
Admission: RE | Admit: 2020-04-17 | Discharge: 2020-04-17 | Disposition: A | Discharge status: Home / Self Care | Payer: Medicare HMO | Source: Ambulatory Visit | Attending: Pain Medicine | Admitting: Pain Medicine

## 2020-04-17 ENCOUNTER — Ambulatory Visit (HOSPITAL_BASED_OUTPATIENT_CLINIC_OR_DEPARTMENT_OTHER): Payer: Medicare HMO | Admitting: Pain Medicine

## 2020-04-17 ENCOUNTER — Encounter: Payer: Self-pay | Admitting: Pain Medicine

## 2020-04-17 VITALS — BP 110/80 | HR 72 | Temp 97.2°F | Resp 12 | Ht 64.0 in | Wt 365.0 lb

## 2020-04-17 DIAGNOSIS — Z91013 Allergy to seafood: Secondary | ICD-10-CM | POA: Diagnosis not present

## 2020-04-17 DIAGNOSIS — Z6841 Body Mass Index (BMI) 40.0 and over, adult: Secondary | ICD-10-CM | POA: Insufficient documentation

## 2020-04-17 DIAGNOSIS — M25561 Pain in right knee: Secondary | ICD-10-CM | POA: Diagnosis not present

## 2020-04-17 DIAGNOSIS — M174 Other bilateral secondary osteoarthritis of knee: Secondary | ICD-10-CM | POA: Insufficient documentation

## 2020-04-17 DIAGNOSIS — Z96641 Presence of right artificial hip joint: Secondary | ICD-10-CM | POA: Diagnosis not present

## 2020-04-17 DIAGNOSIS — G8929 Other chronic pain: Secondary | ICD-10-CM

## 2020-04-17 DIAGNOSIS — Z96642 Presence of left artificial hip joint: Secondary | ICD-10-CM | POA: Insufficient documentation

## 2020-04-17 DIAGNOSIS — M25562 Pain in left knee: Secondary | ICD-10-CM | POA: Diagnosis not present

## 2020-04-17 MED ORDER — SODIUM HYALURONATE (VISCOSUP) 20 MG/2ML IX SOSY: 2.0000 mL | PREFILLED_SYRINGE | Freq: Once | INTRA_ARTICULAR | Status: DC

## 2020-04-17 MED ORDER — FENTANYL CITRATE (PF) 100 MCG/2ML IJ SOLN
25.0000 ug | INTRAMUSCULAR | Status: DC | PRN
  Administered 2020-04-17: 50 ug via INTRAVENOUS
  Filled 2020-04-17: qty 2

## 2020-04-17 MED ORDER — CEFAZOLIN SODIUM-DEXTROSE 1-4 GM/50ML-% IV SOLN
1.0000 g | Freq: Once | INTRAVENOUS | Status: AC
  Administered 2020-04-17: 1 g via INTRAVENOUS

## 2020-04-17 MED ORDER — LIDOCAINE HCL (PF) 1 % IJ SOLN
5.0000 mL | Freq: Once | INTRAMUSCULAR | Status: DC
  Filled 2020-04-17: qty 5

## 2020-04-17 MED ORDER — SODIUM HYALURONATE (VISCOSUP) 20 MG/2ML IX SOSY
2.0000 mL | PREFILLED_SYRINGE | Freq: Once | INTRA_ARTICULAR | Status: AC
  Administered 2020-04-17: 2 mL via INTRA_ARTICULAR

## 2020-04-17 MED ORDER — LIDOCAINE HCL 2 % IJ SOLN
20.0000 mL | Freq: Once | INTRAMUSCULAR | Status: AC
  Administered 2020-04-17: 400 mg

## 2020-04-17 MED ORDER — ROPIVACAINE HCL 2 MG/ML IJ SOLN
5.0000 mL | Freq: Once | INTRAMUSCULAR | Status: AC
  Administered 2020-04-17: 5 mL via INTRA_ARTICULAR
  Filled 2020-04-17: qty 10

## 2020-04-17 MED ORDER — CEFAZOLIN SODIUM 1 G IJ SOLR
INTRAMUSCULAR | Status: AC
  Filled 2020-04-17: qty 10

## 2020-04-17 MED ORDER — LACTATED RINGERS IV SOLN
1000.0000 mL | Freq: Once | INTRAVENOUS | Status: AC
  Administered 2020-04-17: 1000 mL via INTRAVENOUS

## 2020-04-17 MED ORDER — MIDAZOLAM HCL 5 MG/5ML IJ SOLN
1.0000 mg | INTRAMUSCULAR | Status: DC | PRN
  Administered 2020-04-17: 2 mg via INTRAVENOUS
  Filled 2020-04-17: qty 5

## 2020-04-17 NOTE — Patient Instructions (Signed)

## 2020-04-17 NOTE — Progress Notes (Signed)
PROVIDER NOTE: Information contained herein reflects review and annotations entered in association with encounter. Interpretation of such information and data should be left to medically-trained personnel. Information provided to patient can be located elsewhere in the medical record under "Patient Instructions". Document created using STT-dictation technology, any transcriptional errors that may result from process are unintentional.    Patient: Karen Lewis  Service Category: Procedure  Provider: Gaspar Cola, MD  DOB: [birth year]  DOS: 04/17/2020  Location: [facility]  MRN: [id number]  Setting: Ambulatory - outpatient  Referring Provider: Idelle Crouch, MD  Type: Established Patient  Specialty: Interventional Pain Management  PCP: Idelle Crouch, MD   Primary Reason for Visit: Interventional Pain Management Treatment. CC: Knee Pain (bilateral)  Procedure:          Anesthesia, Analgesia, Anxiolysis:  Type: Palliative Intra-Articular Hyalgan Knee Injection #3  Region: Medial infrapatellar Knee Region Level: Knee Joint Laterality: Bilateral  Type: Moderate (Conscious) Sedation combined with Local Anesthesia Indication(s): Analgesia and Anxiety Local Anesthetic: Lidocaine 1-2% Route: Intravenous (IV) IV Access: Secured Sedation: Meaningful verbal contact was maintained at all times during the procedure   Position: Supine w/ knee bent 20 to 30 degrees   Indications: 1. Chronic knee pain (Primary Area of Pain) (Bilateral) (R>L)   2. Secondary Osteoarthritis of knee (Bilateral) (R>L)    Class 3 severe obesity due to excess calories with serious comorbidity and body mass index (BMI) of 60.0 to 69.9 in adult [facility])    H/O allergy to shellfish & Iodine    Pain Score: Pre-procedure: 3 /10 Post-procedure: 0-No pain/10   Pre-op Assessment:  Karen Lewis is a 49 y.o. (year old), female patient, seen today for interventional treatment. She  has a past surgical history  that includes Laparoscopic partial gastrectomy; Shoulder arthroscopy (Right); Joint replacement (Left, hip); Carpal tunnel release (Bilateral); Diagnostic laparoscopy; Cholecystectomy; Trigger finger release (Right); Thyroidectomy (N/A, 11/12/2015); left trigger finger; Roux-en-Y Gastric Bypass (06/03/2017); Hiatal hernia repair; peniculectomy (N/A, 07/05/2018); Appendectomy; and Total hip arthroplasty (Right, 11/27/2019). Karen Lewis has a current medication list which includes the following prescription(s): accu-chek aviva plus, acetaminophen, acidophilus, albuterol, amitriptyline, vitamin c, biotin, butalbital-acetaminophen-caffeine, calcium carbonate, clonazepam, b-12, diclofenac sodium, diphenhydramine, empagliflozin, epinephrine, ergocalciferol, famotidine, accu-chek aviva plus, jardiance, levocetirizine, levothyroxine, lisinopril, magnesium oxide, meclizine, medroxyprogesterone, metformin, metoprolol tartrate, montelukast, multi-vitamin, naloxone, naphazoline-pheniramine, omeprazole, oxycodone, [START ON 05/16/2020] oxycodone, pregabalin, quetiapine, rosuvastatin, sertraline, spironolactone, b complex-c, tizanidine, topiramate, triamcinolone, zolpidem, zonisamide, diclofenac sodium, tab-a-vite, mupirocin ointment, and oxycodone, and the following Facility-Administered Medications: cefazolin, fentanyl, lidocaine (pf), midazolam, and sodium hyaluronate. Her primarily concern today is the Knee Pain (bilateral)  Initial Vital Signs:  Pulse/HCG Rate: 78ECG Heart Rate: 75 Temp: (!) 97.1 F (36.2 C) Resp: (!) 100 BP: 116/77 SpO2: 95 %  BMI: Estimated body mass index is 62.65 kg/m as calculated from the following:   Height as of this encounter: 5\' 4"  (1.626 m).   Weight as of this encounter: 365 lb (165.6 kg).  Risk Assessment: Allergies: Reviewed. She is allergic to bactrim [sulfamethoxazole-trimethoprim], omalizumab, ciprofloxacin, shellfish allergy, and nsaids.  Allergy Precautions: None  required Coagulopathies: Reviewed. None identified.  Blood-thinner therapy: None at this time Active Infection(s): Reviewed. None identified. Karen Lewis is afebrile  Site Confirmation: Karen Lewis was asked to confirm the procedure and laterality before marking the site Procedure checklist: Completed Consent: Before the procedure and under the influence of no sedative(s), amnesic(s), or anxiolytics, the patient was informed of the treatment options, risks and possible complications. To  fulfill our ethical and legal obligations, as recommended by the American Medical Association's Code of Ethics, I have informed the patient of my clinical impression; the nature and purpose of the treatment or procedure; the risks, benefits, and possible complications of the intervention; the alternatives, including doing nothing; the risk(s) and benefit(s) of the alternative treatment(s) or procedure(s); and the risk(s) and benefit(s) of doing nothing. The patient was provided information about the general risks and possible complications associated with the procedure. These may include, but are not limited to: failure to achieve desired goals, infection, bleeding, organ or nerve damage, allergic reactions, paralysis, and death. In addition, the patient was informed of those risks and complications associated to the procedure, such as failure to decrease pain; infection; bleeding; organ or nerve damage with subsequent damage to sensory, motor, and/or autonomic systems, resulting in permanent pain, numbness, and/or weakness of one or several areas of the body; allergic reactions; (i.e.: anaphylactic reaction); and/or death. Furthermore, the patient was informed of those risks and complications associated with the medications. These include, but are not limited to: allergic reactions (i.e.: anaphylactic or anaphylactoid reaction(s)); adrenal axis suppression; blood sugar elevation that in diabetics may result in ketoacidosis or  comma; water retention that in patients with history of congestive heart failure may result in shortness of breath, pulmonary edema, and decompensation with resultant heart failure; weight gain; swelling or edema; medication-induced neural toxicity; particulate matter embolism and blood vessel occlusion with resultant organ, and/or nervous system infarction; and/or aseptic necrosis of one or more joints. Finally, the patient was informed that Medicine is not an exact science; therefore, there is also the possibility of unforeseen or unpredictable risks and/or possible complications that may result in a catastrophic outcome. The patient indicated having understood very clearly. We have given the patient no guarantees and we have made no promises. Enough time was given to the patient to ask questions, all of which were answered to the patient's satisfaction. Ms. Alix has indicated that she wanted to continue with the procedure. Attestation: I, the ordering provider, attest that I have discussed with the patient the benefits, risks, side-effects, alternatives, likelihood of achieving goals, and potential problems during recovery for the procedure that I have provided informed consent. Date  Time: 04/17/2020 10:44 AM  Pre-Procedure Preparation:  Monitoring: As per clinic protocol. Respiration, ETCO2, SpO2, BP, heart rate and rhythm monitor placed and checked for adequate function Safety Precautions: Patient was assessed for positional comfort and pressure points before starting the procedure. Time-out: I initiated and conducted the "Time-out" before starting the procedure, as per protocol. The patient was asked to participate by confirming the accuracy of the "Time Out" information. Verification of the correct person, site, and procedure were performed and confirmed by me, the nursing staff, and the patient. "Time-out" conducted as per Joint Commission's Universal Protocol (UP.01.01.01). Time:  1149  Description of Procedure:          Target Area: Knee Joint Approach: Just above the Medial tibial plateau, lateral to the infrapatellar tendon. Area Prepped: Entire knee area, from the mid-thigh to the mid-shin. DuraPrep (Iodine Povacrylex [0.7% available iodine] and Isopropyl Alcohol, 74% w/w) Safety Precautions: Aspiration looking for blood return was conducted prior to all injections. At no point did we inject any substances, as a needle was being advanced. No attempts were made at seeking any paresthesias. Safe injection practices and needle disposal techniques used. Medications properly checked for expiration dates. SDV (single dose vial) medications used. Description of the Procedure: Protocol  guidelines were followed. The patient was placed in position over the fluoroscopy table. The target area was identified and the area prepped in the usual manner. Skin & deeper tissues infiltrated with local anesthetic. Appropriate amount of time allowed to pass for local anesthetics to take effect. The procedure needles were then advanced to the target area. Proper needle placement secured. Negative aspiration confirmed. Solution injected in intermittent fashion, asking for systemic symptoms every 0.5cc of injectate. The needles were then removed and the area cleansed, making sure to leave some of the prepping solution back to take advantage of its long term bactericidal properties. Vitals:   04/17/20 1155 04/17/20 1205 04/17/20 1215 04/17/20 1225  BP: 110/84 110/83 102/76 110/80  Pulse: 72     Resp: 16 14 14 12   Temp:  (!) 97.2 F (36.2 C)    SpO2: 97% 98% 98% 99%  Weight:      Height:        Start Time: 1149 hrs. End Time: 1155 hrs. Materials:  Needle(s) Type: Regular needle Gauge: 22G Length: 3.5-in Medication(s): Please see orders for medications and dosing details.  Imaging Guidance:          Type of Imaging Technique: Fluoroscopy Guidance (Non-spinal) Indication(s): Morbid  obesity. Assistance in needle guidance and placement for procedures requiring needle placement in or near specific anatomical locations impossible to access without such assistance. Exposure Time: Please see nurses notes. Contrast: None used. Fluoroscopic Guidance: I was personally present during the use of fluoroscopy. "Tunnel Vision Technique" used to obtain the best possible view of the target area. Parallax error corrected before commencing the procedure. "Direction-depth-direction" technique used to introduce the needle under continuous pulsed fluoroscopy. Once target was reached, antero-posterior, oblique, and lateral fluoroscopic projection used confirm needle placement in all planes. Images permanently stored in EMR. Ultrasound Guidance: N/A Interpretation: No contrast injected. I personally interpreted the imaging intraoperatively. Adequate needle placement confirmed in multiple planes. Permanent images saved into the patient's record.  Antibiotic Prophylaxis:   Anti-infectives (From admission, onward)   Start     Dose/Rate Route Frequency Ordered Stop   04/17/20 1115  ceFAZolin (ANCEF) IVPB 1 g/50 mL premix     Discontinue     1 g 100 mL/hr over 30 Minutes Intravenous  Once 04/17/20 1101       Indication(s): None identified  Post-operative Assessment:  Post-procedure Vital Signs:  Pulse/HCG Rate: 7280 Temp: (!) 97.2 F (36.2 C) Resp: 12 BP: 110/80 SpO2: 99 %  EBL: None  Complications: No immediate post-treatment complications observed by team, or reported by patient.  Note: The patient tolerated the entire procedure well. A repeat set of vitals were taken after the procedure and the patient was kept under observation following institutional policy, for this type of procedure. Post-procedural neurological assessment was performed, showing return to baseline, prior to discharge. The patient was provided with post-procedure discharge instructions, including a section on how to  identify potential problems. Should any problems arise concerning this procedure, the patient was given instructions to immediately contact us, at any time, without hesitation. In any case, we plan to contact the patient by telephone for a follow-up status report regarding this interventional procedure.  Comments:  No additional relevant information.  Plan of Care  Orders:  Orders Placed This Encounter  Procedures  . KNEE INJECTION    Hyalgan knee injection to be done by MD.    Scheduling Instructions:     Procedure: Intra-articular Hyalgan Knee injection #3  Side(s): Bilateral Knee     Sedation: With Sedation.     Timeframe: Today    Order Specific Question:   Where will this procedure be performed?    Answer:   [facility]  . KNEE INJECTION    Hyalgan knee injection. Please order Hyalgan.    Standing Status:   Future    Standing Expiration Date:   05/18/2020    Scheduling Instructions:     Procedure: Intra-articular Hyalgan Knee injection #4     Side: Bilateral     Sedation: With Sedation.     Timeframe: in two (2) weeks    Order Specific Question:   Where will this procedure be performed?    Answer:   [facility]  . DG PAIN CLIN[facility] C-ARM 1-60 MIN NO REPORT    Intraoperative interpretation by procedural physician at [place].    Standing Status:   Standing    Number of Occurrences:   1    Order Specific Question:   Reason for exam:    Answer:   Assistance in needle guidance and placement for procedures requiring needle placement in or near specific anatomical locations not easily accessible without such assistance.  . Informed Consent Details: Physician/Practitioner Attestation; Transcribe to consent form and obtain patient signature    Provider Attestation: I, Ahtanum [name], MD, (Pain Management Specialist), the physician/practitioner, attest that I have discussed with the patient the benefits, risks, side effects, alternatives, likelihood  of achieving goals and potential problems during recovery for the procedure that I have provided informed consent.    Scheduling Instructions:     Procedure: Therapeutic, bilateral, intra-articular Hyalgan knee injection     Indications: Chronic bilateral knee pain secondary to primary osteoarthritis of the knee     Note: Always confirm laterality of pain with Ms. Brigitte Pulse, before procedure.     Transcribe to consent form and obtain patient signature.  . Provide equipment / supplies at bedside    Equipment required: Single use, disposable, "Block Tray"    Standing Status:   Standing    Number of Occurrences:   1    Order Specific Question:   Specify    Answer:   Block Tray  . Miscellanous precautions    NOTE: Although It is true that patients can have allergies to shellfish and that shellfish contain iodine, most shellfish  allergies are due to two protein allergens present in the shellfish: tropomyosins and parvalbumin. Not all patients with shellfish allergies are allergic to iodine. However, as a precaution, avoid using iodine containing products.    Standing Status:   Standing    Number of Occurrences:   1   Chronic Opioid Analgesic:  Oxycodone IR 5 mg 1 tab PO 5X/day (#150/mo) (25 mg/day) MME/day:37.5 mg/day.   Medications ordered for procedure: Meds ordered this encounter  Medications  . lidocaine (XYLOCAINE) 2 % (with pres) injection 400 mg  . lactated ringers infusion 1,000 mL  . midazolam (VERSED) 5 MG/5ML injection 1-2 mg    Make sure Flumazenil is available in the pyxis when using this medication. If oversedation occurs, administer 0.2 mg IV over 15 sec. If after 45 sec no response, administer 0.2 mg again over 1 min; may repeat at 1 min intervals; not to exceed 4 doses (1 mg)  . fentaNYL (SUBLIMAZE) injection 25-50 mcg    Make sure Narcan is available in the pyxis when using this medication. In the event of respiratory depression (RR< 8/min): Titrate NARCAN (  naloxone) in  increments of 0.1 to 0.2 mg IV at 2-3 minute intervals, until desired degree of reversal.  . lidocaine (PF) (XYLOCAINE) 1 % injection 5 mL  . ropivacaine (PF) 2 mg/mL (0.2%) (NAROPIN) injection 5 mL  . Sodium Hyaluronate SOSY 2 mL  . Sodium Hyaluronate SOSY 2 mL  . ceFAZolin (ANCEF) IVPB 1 g/50 mL premix    Order Specific Question:   Antibiotic Indication:    Answer:   Surgical Prophylaxis    Order Specific Question:   Other Indication:    Answer:   Procedure Prophylaxis   Medications administered: We administered lidocaine, lactated ringers, midazolam, fentaNYL, ropivacaine (PF) 2 mg/mL (0.2%), Sodium Hyaluronate, and ceFAZolin.  See the medical record for exact dosing, route, and time of administration.  Follow-up plan:   Return in about 2 weeks (around 05/01/2020) for Procedure (w/ sedation): (B) Hyalgan #4 (Fluoro required).       Considering:  NOTE: NO RFA until BMI less or equal to 35.    Palliative PRN treatment(s):  Palliative bilateral IA Hyalgan knee injections  Palliative bilateral genicular NB  Palliative bilateral lumbar facet blocks     Recent Visits Date Type Provider Dept  03/13/20 Procedure visit Milinda Pointer, MD [facility]  02/19/20 Procedure visit Milinda Pointer, MD [facility]  Showing recent visits within past 90 days and meeting all other requirements Today's Visits Date Type Provider Dept  04/17/20 Procedure visit Milinda Pointer, MD [facility]  Showing today's visits and meeting all other requirements Future Appointments Date Type Provider Dept  06/11/20 Appointment Milinda Pointer, MD [facility]  Showing future appointments within next 90 days and meeting all other requirements  Disposition: Discharge home  Discharge (Date  Time): 04/17/2020; 1230 hrs.   Primary Care Physician: Idelle Crouch, MD Location: [facility] Note by: Gaspar Cola, MD Date:  04/17/2020; Time: 12:31 PM  Disclaimer:  Medicine is not an Chief Strategy Officer. The only guarantee in medicine is that nothing is guaranteed. It is important to note that the decision to proceed with this intervention was based on the information collected from the patient. The Data and conclusions were drawn from the patient's questionnaire, the interview, and the physical examination. Because the information was provided in large part by the patient, it cannot be guaranteed that it has not been purposely or unconsciously manipulated. Every effort has been made to obtain as much relevant data as possible for this evaluation. It is important to note that the conclusions that lead to this procedure are derived in large part from the available data. Always take into account that the treatment will also be dependent on availability of resources and existing treatment guidelines, considered by other Pain Management Practitioners as being common knowledge and practice, at the time of the intervention. For Medico-Legal purposes, it is also important to point out that variation in procedural techniques and pharmacological choices are the acceptable norm. The indications, contraindications, technique, and results of the above procedure should only be interpreted and judged by a Board-Certified Interventional Pain Specialist with extensive familiarity and expertise in the same exact procedure and technique.

## 2020-04-17 NOTE — Telephone Encounter (Signed)
Pt is also scheduled for Venofer, will she be needing venofer rescheduled as well? She had labs done yesterday.

## 2020-04-17 NOTE — Progress Notes (Signed)
Safety precautions to be maintained throughout the outpatient stay will include: orient to surroundings, keep bed in low position, maintain call bell within reach at all times, provide assistance with transfer out of bed and ambulation.  

## 2020-04-17 NOTE — Telephone Encounter (Signed)
Done Appts were R/S as requested. Pt is aware.

## 2020-04-18 ENCOUNTER — Inpatient Hospital Stay: Payer: Medicare HMO | Admitting: Oncology

## 2020-04-18 ENCOUNTER — Other Ambulatory Visit: Payer: Self-pay

## 2020-04-18 ENCOUNTER — Inpatient Hospital Stay: Payer: Medicare HMO

## 2020-04-18 ENCOUNTER — Encounter
Admission: RE | Admit: 2020-04-18 | Discharge: 2020-04-18 | Disposition: A | Discharge status: Home / Self Care | Payer: Medicare HMO | Source: Ambulatory Visit | Attending: Orthopedic Surgery | Admitting: Orthopedic Surgery

## 2020-04-18 ENCOUNTER — Telehealth: Payer: Self-pay

## 2020-04-18 HISTORY — DX: Failed or difficult intubation, initial encounter: T88.4XXA

## 2020-04-18 NOTE — Telephone Encounter (Signed)
Post procedure phone call. Patient states she is doing good.  

## 2020-04-18 NOTE — Patient Instructions (Addendum)
Your procedure is scheduled on: 04/24/20 Report to [place]. To find out your arrival time please call [phone] between 1PM - 3PM on 04/23/20.  Remember: Instructions that are not followed completely may result in serious medical risk, up to and including death, or upon the discretion of your surgeon and anesthesiologist your surgery may need to be rescheduled.     _X__ 1. Do not eat food after midnight the night before your procedure.                 No gum chewing or hard candies. You may drink clear liquids up to 2 hours                 before you are scheduled to arrive for your surgery- DO not drink clear                 liquids within 2 hours of the start of your surgery.                 Clear Liquids include:  water, apple juice without pulp, clear carbohydrate                 drink such as Clearfast or Gatorade, Black Coffee or Tea (Do not add                 anything to coffee or tea). Diabetics water only  __X__2.  On the morning of surgery brush your teeth with toothpaste and water, you                 may rinse your mouth with mouthwash if you wish.  Do not swallow any              toothpaste of mouthwash.     _X__ 3.  No Alcohol for 24 hours before or after surgery.   _X__ 4.  Do Not Smoke or use e-cigarettes For 24 Hours Prior to Your Surgery.                 Do not use any chewable tobacco products for at least 6 hours prior to                 surgery.  ____  5.  Bring all medications with you on the day of surgery if instructed.   __X__  6.  Notify your doctor if there is any change in your medical condition      (cold, fever, infections).     Do not wear jewelry, make-up, hairpins, clips or nail polish. Do not wear lotions, powders, or perfumes.  Do not shave 48 hours prior to surgery. Men may shave face and neck. Do not bring valuables to the hospital.    [facility] is not responsible for any belongings or  valuables.  Contacts, dentures/partials or body piercings may not be worn into surgery. Bring a case for your contacts, glasses or hearing aids, a denture cup will be supplied. Leave your suitcase in the car. After surgery it may be brought to your room. For patients admitted to the hospital, discharge time is determined by your treatment team.   Patients discharged the day of surgery will not be allowed to drive home.   Please read over the following fact sheets that you were given:   MRSA Information  __X__ Take these medicines the morning of surgery with A SIP OF WATER:  1. levothyroxine (SYNTHROID) 125 MCG tablet  2. metoprolol tartrate (LOPRESSOR) 75 MG tablet  3. omeprazole (PRILOSEC) 40 MG capsule  4. oxyCODONE (OXY IR/ROXICODONE) 5 MG immediate release tablet  5. pregabalin (LYRICA) 225 MG capsule  6. rosuvastatin (CRESTOR) 40 MG tablet  7. sertraline (ZOLOFT) 100 MG tablet  8. topiramate (TOPAMAX) 50 MG tablet  9. magnesium oxide (MAG-OX) 1200 MG tablet  10. clonazePAM (KLONOPIN) 0.5 MG tablet if needed  ____ Fleet Enema (as directed)   __X__ Use CHG Soap/SAGE wipes as directed  __X__ Use inhalers on the day of surgery  __X__ Stop metformin/Janumet/Farxiga 2 days prior to surgery    ____ Take 1/2 of usual insulin dose the night before surgery. No insulin the morning          of surgery.   ____ Stop Blood Thinners Coumadin/Plavix/Xarelto/Pleta/Pradaxa/Eliquis/Effient/Aspirin  on   Or contact your Surgeon, Cardiologist or Medical Doctor regarding  ability to stop your blood thinners  __X__ Stop Anti-inflammatories 7 days before surgery such as Advil, Ibuprofen, Motrin,  BC or Goodies Powder, Naprosyn, Naproxen, Aleve, Aspirin    __X__ Stop all herbal supplements, fish oil or vitamin E until after surgery.    ____ Bring C-Pap to the hospital.      How to Use an Incentive Spirometer An incentive spirometer is a tool that measures how well you are filling your  lungs with each breath. Learning to take long, deep breaths using this tool can help you keep your lungs clear and active. This may help to reverse or lessen your chance of developing breathing (pulmonary) problems, especially infection. You may be asked to use a spirometer:  After a surgery.  If you have a lung problem or a history of smoking.  After a long period of time when you have been unable to move or be active. If the spirometer includes an indicator to show the highest number that you have reached, your health care provider or respiratory therapist will help you set a goal. Keep a list (log) of your progress as told by your health care provider. What are the risks?  Breathing too quickly may cause dizziness or cause you to pass out. Take your time so you do not get dizzy or light-headed.  If you are in pain, you may need to take pain medicine before doing incentive spirometry. It is harder to take a deep breath if you are having pain. How to use your incentive spirometer  1. Sit up on the edge of your bed or on a chair. 2. Hold the incentive spirometer so that it is in an upright position. 3. Before you use the spirometer, breathe out normally. 4. Place the mouthpiece in your mouth. Make sure your lips are closed tightly around it. 5. Breathe in slowly and as deeply as you can through your mouth, causing the piston or the ball to rise toward the top of the chamber. 6. Hold your breath for 3-5 seconds, or for as long as possible. ? If the spirometer includes a coach indicator, use this to guide you in breathing. Slow down your breathing if the indicator goes above the marked areas. 7. Remove the mouthpiece from your mouth and breathe out normally. The piston or ball will return to the bottom of the chamber. 8. Rest for a few seconds, then repeat the steps 10 or more times. ? Take your time and take a few normal breaths between deep breaths so that you do not get dizzy or  light-headed. ? Do this every 1-2 hours when you are awake. 9. If the spirometer includes a goal marker to show the highest number you have reached (best effort), use this as a goal to work toward during each repetition. 10. After each set of 10 deep breaths, cough a few times. This will help to make sure that your lungs are clear. ? If you have an incision on your chest or abdomen from surgery, place a pillow or a rolled-up towel firmly against the incision when you cough. This can help to reduce pain from coughing. General tips  When you become able to get out of bed, walk around often and continue to cough to help clear your lungs.  Keep using the incentive spirometer until your health care provider says it is okay to stop using it. If you have been in the hospital, you may be told to keep using the spirometer at home. Contact a health care provider if:  You are having difficulty using the spirometer.  You have trouble using the spirometer as often as instructed.  Your pain medicine is not giving enough relief for you to use the spirometer as told.  You have a fever.  You develop shortness of breath. Get help right away if:  You develop a cough with bloody mucus from the lungs (bloody sputum).  You have fluid or blood coming from an incision site after you cough. Summary  An incentive spirometer is a tool that can help you learn to take long, deep breaths to keep your lungs clear and active.  You may be asked to use a spirometer after a surgery, if you have a lung problem or a history of smoking, or if you have been inactive for a long period of time.  Use your incentive spirometer as instructed every 1-2 hours while you are awake.  If you have an incision on your chest or abdomen, place a pillow or a rolled-up towel firmly against your incision when you cough. This will help to reduce pain. This information is not intended to replace advice given to you by your health care  provider. Make sure you discuss any questions you have with your health care provider. Document Revised: 03/30/2019 Document Reviewed: 07/13/2017 Elsevier Patient Education  2020 [employer].

## 2020-04-21 ENCOUNTER — Inpatient Hospital Stay (HOSPITAL_BASED_OUTPATIENT_CLINIC_OR_DEPARTMENT_OTHER): Payer: Medicare HMO | Admitting: Oncology

## 2020-04-21 ENCOUNTER — Encounter: Payer: Self-pay | Admitting: Oncology

## 2020-04-21 DIAGNOSIS — D509 Iron deficiency anemia, unspecified: Secondary | ICD-10-CM | POA: Diagnosis not present

## 2020-04-21 DIAGNOSIS — Z9884 Bariatric surgery status: Secondary | ICD-10-CM | POA: Diagnosis not present

## 2020-04-21 NOTE — Progress Notes (Signed)
Patient contacted for Mychart visit. Pt reports she is having increased tiredness.

## 2020-04-21 NOTE — Progress Notes (Signed)
[facility] Perioperative Services  Pre-Admission/Anesthesia Testing Clinical Review  Date: 04/21/20  Patient Demographics:  Name: Karen Lewis DOB:   1971/09/04 MRN:   [id number]  Planned Surgical Procedure(s):    Case: [id number] Date/Time: 04/24/20 0829   Procedure: Right ring and middle trigger release (Right )   Anesthesia type: Choice   Pre-op diagnosis:      Acquired trigger finger of right middle finger M65.331     Acquired trigger finger of right ring finger M65.341   Location: [facility] OR ROOM 01 / [place] [facility]S [facility] ANEST[facility]   Surgeons: Hessie Knows, MD     NOTE: Available PAT nursing documentation and vital signs have been reviewed. Clinical nursing staff has updated patient's PMH/PSHx, current medication list, and drug allergies/intolerances to ensure comprehensive history available to assist in medical decision making as it pertains to the aforementioned surgical procedure and anticipated anesthetic course.   Clinical Discussion:  Karen Lewis is a 49 y.o. female who is submitted for pre-surgical anesthesia review and clearance prior to her undergoing the above procedure. Patient has never been a smoker. Pertinent PMH includes: angina, palpitations, peripheral edema, T2DM, HTN, HLD, GERD with gastritis, sleep apnea (not on nocturnal PAP therapy; AHI < 6), asthma, anemia, hypothyroidism, chronic back pain, fibromyalgia, OA, anxiety, depression, chronic opioid dependence, RNY bypass with remain (class III obesity), MRSA infections, lymphedema.   Patient is followed by cardiology Saralyn Pilar, MD). She was last seen by cardiology clinic vis tele-health visit on 02/12/2020; notes reviewed. Patient was doing well overall. Reported "feeling better". No angina, however some "chest aching" that has not been associated with exertion. Patient attributes to excess caffeine consumption. Some intermittent palpitations reported. No presyncope/syncope or vertiginous symptoms.  TTE in 05/2019 reveled an LVEF of 55%. HTN well controlled on beta blocker, ACEi, and K+ sparing diuretic therapies. T2DM and HLD well controlled. She complained of chronic shortness of breath and had recently established care with pulmonology Raul Del, MD). Patient to follow up with cardiology in 1 year. Pre-operative clearance for elective orthopedic surgery issued on 08//05/2020 by cardiology with a LOW risk stratification. This patient is not on daily anticoagulation therapy.  She denies previous intra-operative complications with anesthesia. She underwent a general anesthetic course here (ASA III) in 11/2019 with no documented complications. Patient reports that she has had "abrasions" resulting from endotracheal intubations in the past. She is requesting that the smallest tube possible be used to sure her operative airway.   Vitals with BMI 04/18/2020 04/17/2020 04/17/2020  Height 5\' 4"  - -  Weight 365 lbs - -  BMI 22.63 - -  Systolic - 335 456  Diastolic - 80 76  Pulse - - -  Some encounter information is confidential and restricted. Go to Review Flowsheets activity to see all data.    Providers/Specialists:   NOTE: Primary physician provider listed below. Patient may have been seen by APP or partner within same practice.   PROVIDER ROLE LAST Fabio Bering, MD Orthopedics (Surgeon) 04/02/2020  Idelle Crouch, MD Primary Care Provider 04/16/2020  Isaias Cowman, MD Cardiology 02/12/2020  Wallene Huh, MD Pulmonology 04/16/2020   Allergies:  Bactrim [sulfamethoxazole-trimethoprim], Omalizumab, Ciprofloxacin, Shellfish allergy, and Nsaids  Current Home Medications:   No current facility-administered medications for this encounter.   Marland Kitchen acetaminophen (TYLENOL) 650 MG CR tablet  . acidophilus (RISAQUAD) CAPS capsule  . albuterol (PROVENTIL HFA;VENTOLIN HFA) 108 (90 BASE) MCG/ACT inhaler  . amitriptyline (ELAVIL) 100 MG tablet  . Ascorbic  Acid (VITAMIN C) 1000 MG tablet    . Biotin 10 MG CAPS  . butalbital-acetaminophen-caffeine (FIORICET) 50-325-40 MG tablet  . calcium carbonate (TUMS EX) 750 MG chewable tablet  . clonazePAM (KLONOPIN) 0.5 MG tablet  . Cyanocobalamin (B-12) 2500 MCG TABS  . diclofenac sodium (VOLTAREN) 1 % GEL  . diphenhydrAMINE (BENADRYL) 25 MG tablet  . EPINEPHrine 0.3 mg/0.3 mL IJ SOAJ injection  . ergocalciferol (VITAMIN D2) 1.25 MG (50000 UT) capsule  . famotidine (PEPCID) 20 MG tablet  . levocetirizine (XYZAL) 5 MG tablet  . magnesium oxide (MAG-OX) 400 MG tablet  . meclizine (ANTIVERT) 25 MG tablet  . medroxyPROGESTERone (DEPO-PROVERA) 150 MG/ML injection  . metFORMIN (GLUCOPHAGE) 1000 MG tablet  . metoprolol tartrate (LOPRESSOR) 50 MG tablet  . montelukast (SINGULAIR) 10 MG tablet  . Multiple Vitamin (MULTI-VITAMIN) tablet  . naloxone (NARCAN) nasal spray 4 mg/0.1 mL  . naphazoline-pheniramine (NAPHCON-A) 0.025-0.3 % ophthalmic solution  . omeprazole (PRILOSEC) 40 MG capsule  . oxyCODONE (OXY IR/ROXICODONE) 5 MG immediate release tablet  . pregabalin (LYRICA) 225 MG capsule  . QUEtiapine (SEROQUEL) 100 MG tablet  . rosuvastatin (CRESTOR) 40 MG tablet  . sertraline (ZOLOFT) 100 MG tablet  . spironolactone (ALDACTONE) 25 MG tablet  . tiZANidine (ZANAFLEX) 4 MG tablet  . triamcinolone (NASACORT AQ) 55 MCG/ACT AERO nasal inhaler  . zolpidem (AMBIEN) 10 MG tablet  . zonisamide (ZONEGRAN) 50 MG capsule  . ACCU-CHEK AVIVA PLUS test strip  . empagliflozin (JARDIANCE) 25 MG TABS tablet  . glucose blood (ACCU-CHEK AVIVA PLUS) test strip  . levothyroxine (SYNTHROID) 125 MCG tablet  . lisinopril (ZESTRIL) 5 MG tablet  . oxyCODONE (OXY IR/ROXICODONE) 5 MG immediate release tablet  . [START ON 05/16/2020] oxyCODONE (OXY IR/ROXICODONE) 5 MG immediate release tablet  . SUPER B COMPLEX/C PO  . topiramate (TOPAMAX) 50 MG tablet   History:   Past Medical History:  Diagnosis Date  . Anemia   . Anginal pain ([place])   . Anxiety   .  Arthralgia of hip 07/29/2015  . Arthritis   . Arthritis, degenerative 07/29/2015  . Asthma   . Cephalalgia 07/25/2014  . Dependence on unknown drug ([place])    multiplt controlled drug dependence  . Depression   . Diabetes mellitus without complication ([place])   . Difficult intubation    per pt needs small tube (has had abrasions from tube in past)  . Dysrhythmia    PVC's  . Eczema   . Fibromyalgia   . Gastritis   . GERD (gastroesophageal reflux disease)   . Gonalgia 07/29/2015   Overview:  Overview:  The patient has had bilateral intra-articular Hyalgan injections done on 07/16/2014 and although she seems to do well with this type of therapy, apparently her insurance company does not want to pay for they Hyalgan. On 11/27/2014 the patient underwent a bilateral genicular nerve block with excellent results. On 01/28/2015 she had a right knee genicular radiofrequency ablatio  . Gout   . H/O cardiovascular disorder 03/10/2015  . H/O surgical procedure 12/05/2012   Overview:  LSG ([facility] - April 2013)    . H/O thyroid disease 03/10/2015  . Headache   . Herpes   . History of artificial joint 07/29/2015  . History of hiatal hernia   . Hypertension   . Hypomagnesemia   . Hypothyroidism   . IDA (iron deficiency anemia) 05/28/2019  . LBP (low back pain) 07/29/2015  . Neuromuscular disorder ([place])   . Obesity   . PCOS (polycystic  ovarian syndrome)   . Primary osteoarthritis of both knees 07/29/2015  . Sleep apnea    not using a Cpap machine at this time - most recent test mild apnea does not qualify for test (not OSA)  . Thyroid nodule bilateral  . Umbilical hernia    Past Surgical History:  Procedure Laterality Date  . APPENDECTOMY    . CARPAL TUNNEL RELEASE Bilateral   . CHOLECYSTECTOMY    . DIAGNOSTIC LAPAROSCOPY    . HIATAL HERNIA REPAIR    . JOINT REPLACEMENT Bilateral hip  . LAPAROSCOPIC PARTIAL GASTRECTOMY    . left trigger finger    . peniculectomy N/A 07/05/2018  . ROUX-EN-Y  GASTRIC BYPASS  06/03/2017  . SHOULDER ARTHROSCOPY Right   . THYROIDECTOMY N/A 11/12/2015   Procedure: THYROIDECTOMY;  Surgeon: Clyde Canterbury, MD;  Location: [facility] ORS;  Service: ENT;  Laterality: N/A;  . TOTAL HIP ARTHROPLASTY Right 11/27/2019   Procedure: TOTAL HIP ARTHROPLASTY ANTERIOR APPROACH;  Surgeon: Hessie Knows, MD;  Location: [facility] ORS;  Service: Orthopedics;  Laterality: Right;  . TRIGGER FINGER RELEASE Right    Family History  Problem Relation Age of Onset  . Anxiety disorder Mother   . Depression Mother   . Alcohol abuse Mother   . Diabetes Mother   . Hypertension Mother   . Kidney cancer Mother   . Sleep apnea Mother   . Alcohol abuse Father   . Anxiety disorder Father   . Depression Father   . Post-traumatic stress disorder Father   . Kidney failure Father   . COPD Father   . Diabetes Father   . Hypertension Father   . Sleep apnea Father   . Depression Brother   . Diabetes Brother   . Hypertension Brother   . Sleep apnea Brother   . Breast cancer Paternal Aunt   . Bladder Cancer Neg Hx   . Prostate cancer Neg Hx    Social History   Tobacco Use  . Smoking status: Never Smoker  . Smokeless tobacco: Never Used  Vaping Use  . Vaping Use: Never used  Substance Use Topics  . Alcohol use: No    Alcohol/week: 0.0 standard drinks  . Drug use: No    Pertinent Clinical Results:  LABS: Labs reviewed: Acceptable for surgery.  No visits with results within 3 Day(s) from this visit.  Latest known visit with results is:  Appointment on 04/16/2020  Component Date Value Ref Range Status  . Iron 04/16/2020 106  28 - 170 ug/dL Final  . TIBC 04/16/2020 482* 250 - 450 ug/dL Final  . Saturation Ratios 04/16/2020 22  10.4 - 31.8 % Final  . UIBC 04/16/2020 376  ug/dL Final   Performed at [facility], [address] [place], [place] [address]  . Ferritin 04/16/2020 28  11 - 307 ng/mL Final   Performed at [facility], [address]  [place], [place] [address]  . Sodium 04/16/2020 137  135 - 145 mmol/L Final  . Potassium 04/16/2020 4.5  3.5 - 5.1 mmol/L Final  . Chloride 04/16/2020 104  98 - 111 mmol/L Final  . CO2 04/16/2020 22  22 - 32 mmol/L Final  . Glucose, Bld 04/16/2020 135* 70 - 99 mg/dL Final   Glucose reference range applies only to samples taken after fasting for at least 8 hours.  . BUN 04/16/2020 19  6 - 20 mg/dL Final  . Creatinine, Ser 04/16/2020 0.81  0.44 - 1.00 mg/dL Final  .  Calcium 04/16/2020 8.5* 8.9 - 10.3 mg/dL Final  . Total Protein 04/16/2020 7.5  6.5 - 8.1 g/dL Final  . Albumin 04/16/2020 4.3  3.5 - 5.0 g/dL Final  . AST 04/16/2020 54* 15 - 41 U/L Final  . ALT 04/16/2020 51* 0 - 44 U/L Final  . Alkaline Phosphatase 04/16/2020 75  38 - 126 U/L Final  . Total Bilirubin 04/16/2020 0.6  0.3 - 1.2 mg/dL Final  . GFR calc non Af Amer 04/16/2020 >60  >60 mL/min Final  . GFR calc Af Amer 04/16/2020 >60  >60 mL/min Final  . Anion gap 04/16/2020 11  5 - 15 Final   Performed at [facility], [address], [place], [place] [address]  . WBC 04/16/2020 12.4* 4.0 - 10.5 K/uL Final  . RBC 04/16/2020 4.99  3.87 - 5.11 MIL/uL Final  . Hemoglobin 04/16/2020 13.5  12.0 - 15.0 g/dL Final  . HCT 04/16/2020 40.9  36 - 46 % Final  . MCV 04/16/2020 82.0  80.0 - 100.0 fL Final  . MCH 04/16/2020 27.1  26.0 - 34.0 pg Final  . MCHC 04/16/2020 33.0  30.0 - 36.0 g/dL Final  . RDW 04/16/2020 16.2* 11.5 - 15.5 % Final  . Platelets 04/16/2020 273  150 - 400 K/uL Final  . nRBC 04/16/2020 0.0  0.0 - 0.2 % Final  . Neutrophils Relative % 04/16/2020 70  % Final  . Neutro Abs 04/16/2020 8.7* 1.7 - 7.7 K/uL Final  . Lymphocytes Relative 04/16/2020 21  % Final  . Lymphs Abs 04/16/2020 2.6  0.7 - 4.0 K/uL Final  . Monocytes Relative 04/16/2020 6  % Final  . Monocytes Absolute 04/16/2020 0.7  0 - 1 K/uL Final  . Eosinophils Relative 04/16/2020 2  % Final  . Eosinophils Absolute 04/16/2020 0.3  0 - 0 K/uL Final  .  Basophils Relative 04/16/2020 1  % Final  . Basophils Absolute 04/16/2020 0.1  0 - 0 K/uL Final  . Immature Granulocytes 04/16/2020 0  % Final  . Abs Immature Granulocytes 04/16/2020 0.03  0.00 - 0.07 K/uL Final   Performed at [facility], [address] [place], [place] [address]  . Vitamin B-12 04/16/2020 372  180 - 914 pg/mL Final   Comment: (NOTE) This assay is not validated for testing neonatal or myeloproliferative syndrome specimens for Vitamin B12 levels. Performed at [facility], [place] [address], [place], [place] [address]   . Folate 04/16/2020 96.0  >5.9 ng/mL Final   Comment: RESULT CONFIRMED BY MANUAL DILUTION SKL Performed at [facility], [address] [place], [place] [address]     ECG: Date: 11/22/2019 Time ECG obtained: 1128 AM Rate: 75 bpm Rhythm: normal sinus Axis (leads I and aVF): Normal Intervals: PR 186 ms. QTc 426 ms. ST segment and T wave changes: no acute changes; changes in initial forces of lateral leads remains present Comparison: Similar to previous tracing obtained on 08/13/2019   IMAGING / PROCEDURES: ECHOCARDIOGRAM done on 08/24/2016 1. LVEF 50-55% 2. Normal LV systolic function 3. Normal RV systolic function 4. Trivial MR, TR, PR 5. No valvular stenosis   Impression and Plan:  LIISA PICONE has been referred for pre-anesthesia review and clearance prior her undergoing the planned anesthetic and procedural courses. Available labs, pertinent testing, and imaging results were personally reviewed by me. This patient has been appropriately cleared by cardiology.  Based on clinical review performed today (04/21/20), barring any significant acute changes in the  patient's overall condition, it is anticipated that she will be able to proceed with the planned surgical intervention. Any acute changes in clinical condition may necessitate her procedure being postponed and/or cancelled. Pre-surgical instructions were reviewed with  the patient during her PAT appointment and questions were fielded by PAT clinical staff.  Honor Loh, MSN, APRN, FNP-C, CEN [facility]  Peri-operative Services Nurse Practitioner Phone: [phone] 04/21/20 10:01 AM  NOTE: This note has been prepared using Dragon dictation software. Despite my best ability to proofread, there is always the potential that unintentional transcriptional errors may still occur from this process.

## 2020-04-21 NOTE — Progress Notes (Signed)
HEMATOLOGY-ONCOLOGY TeleHEALTH VISIT PROGRESS NOTE  I connected with Karen Lewis on 04/21/20 at  1:45 PM EDT by video enabled telemedicine visit and verified that I am speaking with the correct person using two identifiers. I discussed the limitations, risks, security and privacy concerns of performing an evaluation and management service by telemedicine and the availability of in-person appointments. I also discussed with the patient that there may be a patient responsible charge related to this service. The patient expressed understanding and agreed to proceed.   Other persons participating in the visit and their role in the encounter:  None  Patient's location: Home  Provider's location: office Chief Complaint: follow up for anemia.    INTERVAL HISTORY Karen Lewis is a 49 y.o. female who has above history reviewed by me today presents for follow up visit for management of anemia.  Problems and complaints are listed below:  Reports feeling more tired.  Otherwise she has no new complaints.   Review of Systems  Constitutional: Positive for fatigue. Negative for appetite change, chills and fever.  HENT:   Negative for hearing loss and voice change.   Eyes: Negative for eye problems.  Respiratory: Negative for chest tightness and cough.   Cardiovascular: Negative for chest pain.  Gastrointestinal: Negative for abdominal distention, abdominal pain and blood in stool.  Endocrine: Negative for hot flashes.  Genitourinary: Negative for difficulty urinating and frequency.   Musculoskeletal: Positive for arthralgias.  Skin: Negative for itching and rash.  Neurological: Negative for extremity weakness.  Hematological: Negative for adenopathy.  Psychiatric/Behavioral: Negative for confusion.    Past Medical History:  Diagnosis Date  . Anemia   . Anginal pain ([place])   . Anxiety   . Arthralgia of hip 07/29/2015  . Arthritis   . Arthritis, degenerative 07/29/2015  . Asthma   . Cephalalgia  07/25/2014  . Dependence on unknown drug ([place])    multiplt controlled drug dependence  . Depression   . Diabetes mellitus without complication ([place])   . Difficult intubation    per pt needs small tube (has had abrasions from tube in past)  . Dysrhythmia    PVC's  . Eczema   . Fibromyalgia   . Gastritis   . GERD (gastroesophageal reflux disease)   . Gonalgia 07/29/2015   Overview:  Overview:  The patient has had bilateral intra-articular Hyalgan injections done on 07/16/2014 and although she seems to do well with this type of therapy, apparently her insurance company does not want to pay for they Hyalgan. On 11/27/2014 the patient underwent a bilateral genicular nerve block with excellent results. On 01/28/2015 she had a right knee genicular radiofrequency ablatio  . Gout   . H/O cardiovascular disorder 03/10/2015  . H/O surgical procedure 12/05/2012   Overview:  LSG ([facility] - April 2013)    . H/O thyroid disease 03/10/2015  . Headache   . Herpes   . History of artificial joint 07/29/2015  . History of hiatal hernia   . Hypertension   . Hypomagnesemia   . Hypothyroidism   . IDA (iron deficiency anemia) 05/28/2019  . LBP (low back pain) 07/29/2015  . Neuromuscular disorder ([place])   . Obesity   . PCOS (polycystic ovarian syndrome)   . Primary osteoarthritis of both knees 07/29/2015  . Sleep apnea    not using a Cpap machine at this time - most recent test mild apnea does not qualify for test (not OSA)  . Thyroid nodule bilateral  . Umbilical hernia  Past Surgical History:  Procedure Laterality Date  . APPENDECTOMY    . CARPAL TUNNEL RELEASE Bilateral   . CHOLECYSTECTOMY    . DIAGNOSTIC LAPAROSCOPY    . HIATAL HERNIA REPAIR    . JOINT REPLACEMENT Bilateral hip  . LAPAROSCOPIC PARTIAL GASTRECTOMY    . left trigger finger    . peniculectomy N/A 07/05/2018  . ROUX-EN-Y GASTRIC BYPASS  06/03/2017  . SHOULDER ARTHROSCOPY Right   . THYROIDECTOMY N/A 11/12/2015   Procedure:  THYROIDECTOMY;  Surgeon: Clyde Canterbury, MD;  Location: [facility] ORS;  Service: ENT;  Laterality: N/A;  . TOTAL HIP ARTHROPLASTY Right 11/27/2019   Procedure: TOTAL HIP ARTHROPLASTY ANTERIOR APPROACH;  Surgeon: Hessie Knows, MD;  Location: [facility] ORS;  Service: Orthopedics;  Laterality: Right;  . TRIGGER FINGER RELEASE Right     Family History  Problem Relation Age of Onset  . Anxiety disorder Mother   . Depression Mother   . Alcohol abuse Mother   . Diabetes Mother   . Hypertension Mother   . Kidney cancer Mother   . Sleep apnea Mother   . Alcohol abuse Father   . Anxiety disorder Father   . Depression Father   . Post-traumatic stress disorder Father   . Kidney failure Father   . COPD Father   . Diabetes Father   . Hypertension Father   . Sleep apnea Father   . Depression Brother   . Diabetes Brother   . Hypertension Brother   . Sleep apnea Brother   . Breast cancer Paternal Aunt   . Bladder Cancer Neg Hx   . Prostate cancer Neg Hx     Social History   Socioeconomic History  . Marital status: Significant Other    Spouse name: Not on file  . Number of children: 0  . Years of education: 56  . Highest education level: High school graduate  Occupational History  . Occupation: Disabled  Tobacco Use  . Smoking status: Never Smoker  . Smokeless tobacco: Never Used  Vaping Use  . Vaping Use: Never used  Substance and Sexual Activity  . Alcohol use: No    Alcohol/week: 0.0 standard drinks  . Drug use: No  . Sexual activity: Not Currently    Birth control/protection: Injection  Other Topics Concern  . Not on file  Social History Narrative  . Not on file   Social Determinants of Health   Financial Resource Strain: Low Risk   . Difficulty of Paying Living Expenses: Not hard at all  Food Insecurity: No Food Insecurity  . Worried About Charity fundraiser in the Last Year: Never true  . Ran Out of Food in the Last Year: Never true  Transportation Needs: No Transportation  Needs  . Lack of Transportation (Medical): No  . Lack of Transportation (Non-Medical): No  Physical Activity: Inactive  . Days of Exercise per Week: 0 days  . Minutes of Exercise per Session: 0 min  Stress: Stress Concern Present  . Feeling of Stress : Rather much  Social Connections: Unknown  . Frequency of Communication with Friends and Family: More than three times a week  . Frequency of Social Gatherings with Friends and Family: More than three times a week  . Attends Religious Services: More than 4 times per year  . Active Member of Clubs or Organizations: No  . Attends Archivist Meetings: Never  . Marital Status: Not on file  Intimate Partner Violence: Not At Risk  . Fear of  Current or Ex-Partner: No  . Emotionally Abused: No  . Physically Abused: No  . Sexually Abused: No    Current Outpatient Medications on File Prior to Visit  Medication Sig Dispense Refill  . ACCU-CHEK AVIVA PLUS test strip     . acetaminophen (TYLENOL) 650 MG CR tablet Take 650-1,300 mg by mouth every 8 (eight) hours as needed for pain.    Marland Kitchen acidophilus (RISAQUAD) CAPS capsule Take 1 capsule by mouth daily.    Marland Kitchen albuterol (PROVENTIL HFA;VENTOLIN HFA) 108 (90 BASE) MCG/ACT inhaler Inhale 2 puffs into the lungs every 6 (six) hours as needed for wheezing or shortness of breath.    Marland Kitchen amitriptyline (ELAVIL) 100 MG tablet Take 100 mg by mouth at bedtime.    . Ascorbic Acid (VITAMIN C) 1000 MG tablet Take 1,000 mg by mouth daily.     . Biotin 10 MG CAPS Take 10 mg by mouth daily.     . calcium carbonate (TUMS EX) 750 MG chewable tablet Chew 1 tablet by mouth daily.     . clonazePAM (KLONOPIN) 0.5 MG tablet Take 1 tablet (0.5 mg total) by mouth 3 (three) times daily as needed for anxiety. 30 tablet 0  . Cyanocobalamin (B-12) 2500 MCG TABS Take 2,500 mcg by mouth daily.    . diclofenac sodium (VOLTAREN) 1 % GEL Apply 2 g topically 4 (four) times daily. (Patient taking differently: Apply 2 g topically 3  (three) times daily as needed (pain). ) 100 g 1  . diphenhydrAMINE (BENADRYL) 25 MG tablet Take 25 mg by mouth every 8 (eight) hours as needed for itching or allergies.     Marland Kitchen empagliflozin (JARDIANCE) 25 MG TABS tablet Take 25 mg by mouth daily.    . ergocalciferol (VITAMIN D2) 1.25 MG (50000 UT) capsule Take 50,000 Units by mouth 2 (two) times a week.     . famotidine (PEPCID) 20 MG tablet Take 20 mg by mouth at bedtime.     Marland Kitchen glucose blood (ACCU-CHEK AVIVA PLUS) test strip Use 2 (two) times daily    . levocetirizine (XYZAL) 5 MG tablet Take 5 mg by mouth at bedtime.     Marland Kitchen levothyroxine (SYNTHROID) 125 MCG tablet Take 250 mcg by mouth daily before breakfast.     . lisinopril (ZESTRIL) 5 MG tablet Take 5 mg by mouth at bedtime.     . magnesium oxide (MAG-OX) 400 MG tablet Take 800-1,200 mg by mouth See admin instructions. Take 1200 mg by mouth in the morning and 800 mg at bedtime    . meclizine (ANTIVERT) 25 MG tablet Take 25 mg by mouth 3 (three) times daily as needed for dizziness.     . medroxyPROGESTERone (DEPO-PROVERA) 150 MG/ML injection Inject 150 mg into the muscle every 3 (three) months.     . metFORMIN (GLUCOPHAGE) 1000 MG tablet Take 1,000 mg by mouth 2 (two) times daily with a meal.     . metoprolol tartrate (LOPRESSOR) 50 MG tablet Take 75 mg by mouth 2 (two) times daily.     . montelukast (SINGULAIR) 10 MG tablet Take 10 mg by mouth at bedtime.     . Multiple Vitamin (MULTI-VITAMIN) tablet Take 2 tablets by mouth daily.    . naphazoline-pheniramine (NAPHCON-A) 0.025-0.3 % ophthalmic solution Place 1 drop into both eyes 4 (four) times daily as needed for eye irritation.    Marland Kitchen omeprazole (PRILOSEC) 40 MG capsule Take 40 mg by mouth in the morning and at bedtime.    Marland Kitchen  oxyCODONE (OXY IR/ROXICODONE) 5 MG immediate release tablet Take 1 tablet (5 mg total) by mouth 5 (five) times daily. Must last 30 days 150 tablet 0  . pregabalin (LYRICA) 225 MG capsule Take 1 capsule (225 mg total) by  mouth 2 (two) times daily. 60 capsule 5  . QUEtiapine (SEROQUEL) 100 MG tablet Take 100 mg by mouth at bedtime.    . rosuvastatin (CRESTOR) 40 MG tablet Take 40 mg by mouth daily.     . sertraline (ZOLOFT) 100 MG tablet Take 200 mg by mouth daily.     Marland Kitchen spironolactone (ALDACTONE) 25 MG tablet Take 37.5 mg by mouth 2 (two) times daily.     . SUPER B COMPLEX/C PO Take 1 tablet by mouth at bedtime.     Marland Kitchen tiZANidine (ZANAFLEX) 4 MG tablet Take 1 tablet (4 mg total) by mouth 2 (two) times daily as needed for muscle spasms. 30 tablet 0  . topiramate (TOPAMAX) 50 MG tablet Take 50 mg by mouth 2 (two) times daily.    Marland Kitchen triamcinolone (NASACORT AQ) 55 MCG/ACT AERO nasal inhaler Place 2 sprays into the nose 2 (two) times daily as needed (allergies).     . zolpidem (AMBIEN) 10 MG tablet Take 10 mg by mouth at bedtime as needed for sleep.    Marland Kitchen zonisamide (ZONEGRAN) 50 MG capsule Take 150 mg by mouth at bedtime.     . butalbital-acetaminophen-caffeine (FIORICET) 50-325-40 MG tablet Take 1 tablet by mouth every 6 (six) hours as needed for migraine.  (Patient not taking: Reported on 04/21/2020)    . EPINEPHrine 0.3 mg/0.3 mL IJ SOAJ injection Inject 0.3 mg into the muscle as needed for anaphylaxis.  (Patient not taking: Reported on 04/21/2020)    . naloxone (NARCAN) nasal spray 4 mg/0.1 mL Spray into one nostril. Repeat with second device into other nostril after 2-3 minutes if no or minimal response. (Patient not taking: Reported on 04/21/2020) 1 kit 0  . oxyCODONE (OXY IR/ROXICODONE) 5 MG immediate release tablet Take 1 tablet (5 mg total) by mouth 5 (five) times daily. Must last 30 days 150 tablet 0  . [START ON 05/16/2020] oxyCODONE (OXY IR/ROXICODONE) 5 MG immediate release tablet Take 1 tablet (5 mg total) by mouth 5 (five) times daily. Must last 30 days (Patient not taking: Reported on 04/21/2020) 150 tablet 0   No current facility-administered medications on file prior to visit.    Allergies  Allergen Reactions   . Bactrim [Sulfamethoxazole-Trimethoprim] Hives  . Omalizumab Itching and Hives  . Ciprofloxacin Other (See Comments)    myalgia   . Shellfish Allergy Other (See Comments)    + positive allergy test  . Nsaids Nausea Only and Other (See Comments)    Stomach upset       Observations/Objective: There were no vitals filed for this visit. There is no height or weight on file to calculate BMI.  Physical Exam Constitutional:      General: She is not in acute distress. Neurological:     Mental Status: She is alert.     CBC    Component Value Date/Time   WBC 12.4 (H) 04/16/2020 1340   RBC 4.99 04/16/2020 1340   HGB 13.5 04/16/2020 1340   HGB 12.1 12/13/2013 1628   HCT 40.9 04/16/2020 1340   HCT 38.4 12/13/2013 1628   PLT 273 04/16/2020 1340   PLT 364 12/13/2013 1628   MCV 82.0 04/16/2020 1340   MCV 82 12/13/2013 1628   MCH 27.1 04/16/2020  1340   MCHC 33.0 04/16/2020 1340   RDW 16.2 (H) 04/16/2020 1340   RDW 16.9 (H) 12/13/2013 1628   LYMPHSABS 2.6 04/16/2020 1340   LYMPHSABS 3.2 04/10/2013 1836   MONOABS 0.7 04/16/2020 1340   MONOABS 0.7 04/10/2013 1836   EOSABS 0.3 04/16/2020 1340   EOSABS 0.3 04/10/2013 1836   BASOSABS 0.1 04/16/2020 1340   BASOSABS 0.1 04/10/2013 1836    CMP     Component Value Date/Time   NA 137 04/16/2020 1340   NA 139 06/17/2014 1811   K 4.5 04/16/2020 1340   K 3.6 06/17/2014 1811   CL 104 04/16/2020 1340   CL 111 (H) 06/17/2014 1811   CO2 22 04/16/2020 1340   CO2 22 06/17/2014 1811   GLUCOSE 135 (H) 04/16/2020 1340   GLUCOSE 99 06/17/2014 1811   BUN 19 04/16/2020 1340   BUN 7 06/17/2014 1811   CREATININE 0.81 04/16/2020 1340   CREATININE 0.98 06/17/2014 1811   CALCIUM 8.5 (L) 04/16/2020 1340   CALCIUM 8.5 06/17/2014 1811   PROT 7.5 04/16/2020 1340   PROT 7.4 06/17/2014 1811   ALBUMIN 4.3 04/16/2020 1340   ALBUMIN 3.5 06/17/2014 1811   AST 54 (H) 04/16/2020 1340   AST 23 06/17/2014 1811   ALT 51 (H) 04/16/2020 1340   ALT 22  06/17/2014 1811   ALKPHOS 75 04/16/2020 1340   ALKPHOS 90 06/17/2014 1811   BILITOT 0.6 04/16/2020 1340   BILITOT 0.2 06/17/2014 1811   GFRNONAA >60 04/16/2020 1340   GFRNONAA >60 06/17/2014 1811   GFRNONAA >60 12/13/2013 1628   GFRAA >60 04/16/2020 1340   GFRAA >60 06/17/2014 1811   GFRAA >60 12/13/2013 1628     Assessment and Plan: 1. Iron deficiency anemia, unspecified iron deficiency anemia type   2. History of gastric bypass    Iron deficiency anemi labs are reviewed and discussed with patient Remained stable at 13.5, Iron panel showed improved ferritin to 28, iron saturation 22, TIBC is still elevated at 482. With her history of gastric bypass, elevated TIBC, recommend to proceed with another dose of IV Venofer treatment. Follow-up every 6 months for evaluation need of additional IV Venofer treatments.  Vitamin B12 level is at 372, continue oral vitamin B12 supplementation.  Follow Up Instructions: 6 months  I discussed the assessment and treatment plan with the patient. The patient was provided an opportunity to ask questions and all were answered. The patient agreed with the plan and demonstrated an understanding of the instructions.  The patient was advised to call back or seek an in-person evaluation if the symptoms worsen or if the condition fails to improve as anticipated.    Earlie Server, MD 04/21/2020 2:11 PM

## 2020-04-22 ENCOUNTER — Inpatient Hospital Stay: Payer: Medicare HMO

## 2020-04-22 ENCOUNTER — Other Ambulatory Visit
Admission: RE | Admit: 2020-04-22 | Discharge: 2020-04-22 | Disposition: A | Discharge status: Home / Self Care | Payer: Medicare HMO | Source: Ambulatory Visit | Attending: Orthopedic Surgery | Admitting: Orthopedic Surgery

## 2020-04-22 ENCOUNTER — Other Ambulatory Visit: Payer: Self-pay

## 2020-04-22 VITALS — BP 128/78 | HR 78 | Temp 97.9°F | Resp 16

## 2020-04-22 DIAGNOSIS — D509 Iron deficiency anemia, unspecified: Secondary | ICD-10-CM

## 2020-04-22 DIAGNOSIS — Z20822 Contact with and (suspected) exposure to covid-19: Secondary | ICD-10-CM | POA: Insufficient documentation

## 2020-04-22 DIAGNOSIS — Z01812 Encounter for preprocedural laboratory examination: Secondary | ICD-10-CM | POA: Diagnosis not present

## 2020-04-22 DIAGNOSIS — Z9884 Bariatric surgery status: Secondary | ICD-10-CM | POA: Diagnosis not present

## 2020-04-22 LAB — SARS CORONAVIRUS 2 (TAT 6-24 HRS): SARS Coronavirus 2: NEGATIVE

## 2020-04-22 MED ORDER — IRON SUCROSE 20 MG/ML IV SOLN
200.0000 mg | Freq: Once | INTRAVENOUS | Status: AC
  Administered 2020-04-22: 200 mg via INTRAVENOUS
  Filled 2020-04-22: qty 10

## 2020-04-22 MED ORDER — SODIUM CHLORIDE 0.9 % IV SOLN: 200.0000 mg | Freq: Once | INTRAVENOUS | Status: DC

## 2020-04-22 MED ORDER — SODIUM CHLORIDE 0.9 % IV SOLN
INTRAVENOUS | Status: DC
  Filled 2020-04-22: qty 250

## 2020-04-23 MED ORDER — DEXTROSE 5 % IV SOLN
3.0000 g | INTRAVENOUS | Status: AC
  Administered 2020-04-24: 3 g via INTRAVENOUS
  Filled 2020-04-23: qty 3

## 2020-04-24 ENCOUNTER — Encounter: Payer: Self-pay | Admitting: Orthopedic Surgery

## 2020-04-24 ENCOUNTER — Ambulatory Visit
Admission: RE | Admit: 2020-04-24 | Discharge: 2020-04-24 | Disposition: A | Discharge status: Home / Self Care | Payer: Medicare HMO | Attending: Orthopedic Surgery | Admitting: Orthopedic Surgery

## 2020-04-24 ENCOUNTER — Ambulatory Visit: Payer: Medicare HMO | Admitting: Urgent Care

## 2020-04-24 ENCOUNTER — Encounter
Admission: RE | Disposition: A | Discharge status: Home / Self Care | Payer: Self-pay | Source: Home / Self Care | Attending: Orthopedic Surgery

## 2020-04-24 DIAGNOSIS — Z9889 Other specified postprocedural states: Secondary | ICD-10-CM | POA: Diagnosis not present

## 2020-04-24 DIAGNOSIS — M1990 Unspecified osteoarthritis, unspecified site: Secondary | ICD-10-CM | POA: Insufficient documentation

## 2020-04-24 DIAGNOSIS — M109 Gout, unspecified: Secondary | ICD-10-CM | POA: Insufficient documentation

## 2020-04-24 DIAGNOSIS — M65331 Trigger finger, right middle finger: Secondary | ICD-10-CM | POA: Insufficient documentation

## 2020-04-24 DIAGNOSIS — E119 Type 2 diabetes mellitus without complications: Secondary | ICD-10-CM | POA: Insufficient documentation

## 2020-04-24 DIAGNOSIS — K219 Gastro-esophageal reflux disease without esophagitis: Secondary | ICD-10-CM | POA: Diagnosis not present

## 2020-04-24 DIAGNOSIS — Z96642 Presence of left artificial hip joint: Secondary | ICD-10-CM | POA: Diagnosis not present

## 2020-04-24 DIAGNOSIS — F329 Major depressive disorder, single episode, unspecified: Secondary | ICD-10-CM | POA: Diagnosis not present

## 2020-04-24 DIAGNOSIS — J45909 Unspecified asthma, uncomplicated: Secondary | ICD-10-CM | POA: Insufficient documentation

## 2020-04-24 DIAGNOSIS — M797 Fibromyalgia: Secondary | ICD-10-CM | POA: Insufficient documentation

## 2020-04-24 DIAGNOSIS — F419 Anxiety disorder, unspecified: Secondary | ICD-10-CM | POA: Diagnosis not present

## 2020-04-24 DIAGNOSIS — I10 Essential (primary) hypertension: Secondary | ICD-10-CM | POA: Diagnosis not present

## 2020-04-24 DIAGNOSIS — Z7984 Long term (current) use of oral hypoglycemic drugs: Secondary | ICD-10-CM | POA: Diagnosis not present

## 2020-04-24 DIAGNOSIS — G4733 Obstructive sleep apnea (adult) (pediatric): Secondary | ICD-10-CM | POA: Diagnosis not present

## 2020-04-24 DIAGNOSIS — G43909 Migraine, unspecified, not intractable, without status migrainosus: Secondary | ICD-10-CM | POA: Insufficient documentation

## 2020-04-24 DIAGNOSIS — Z6841 Body Mass Index (BMI) 40.0 and over, adult: Secondary | ICD-10-CM | POA: Insufficient documentation

## 2020-04-24 DIAGNOSIS — Z881 Allergy status to other antibiotic agents status: Secondary | ICD-10-CM | POA: Diagnosis not present

## 2020-04-24 DIAGNOSIS — Z79890 Hormone replacement therapy (postmenopausal): Secondary | ICD-10-CM | POA: Insufficient documentation

## 2020-04-24 DIAGNOSIS — E785 Hyperlipidemia, unspecified: Secondary | ICD-10-CM | POA: Diagnosis not present

## 2020-04-24 DIAGNOSIS — M65341 Trigger finger, right ring finger: Secondary | ICD-10-CM | POA: Insufficient documentation

## 2020-04-24 DIAGNOSIS — Z886 Allergy status to analgesic agent status: Secondary | ICD-10-CM | POA: Diagnosis not present

## 2020-04-24 DIAGNOSIS — Z79899 Other long term (current) drug therapy: Secondary | ICD-10-CM | POA: Insufficient documentation

## 2020-04-24 DIAGNOSIS — Z882 Allergy status to sulfonamides status: Secondary | ICD-10-CM | POA: Insufficient documentation

## 2020-04-24 DIAGNOSIS — E039 Hypothyroidism, unspecified: Secondary | ICD-10-CM | POA: Insufficient documentation

## 2020-04-24 DIAGNOSIS — G4730 Sleep apnea, unspecified: Secondary | ICD-10-CM | POA: Insufficient documentation

## 2020-04-24 HISTORY — PX: TRIGGER FINGER RELEASE: SHX641

## 2020-04-24 LAB — GLUCOSE, CAPILLARY
Glucose-Capillary: 170 mg/dL — ABNORMAL HIGH (ref 70–99)
Glucose-Capillary: 160 mg/dL — ABNORMAL HIGH (ref 70–99)

## 2020-04-24 LAB — POCT PREGNANCY, URINE: Preg Test, Ur: NEGATIVE

## 2020-04-24 SURGERY — RELEASE, A1 PULLEY, FOR TRIGGER FINGER
Anesthesia: General | Laterality: Right

## 2020-04-24 MED ORDER — DEXMEDETOMIDINE HCL 200 MCG/2ML IV SOLN
INTRAVENOUS | Status: DC | PRN
  Administered 2020-04-24: 8 ug via INTRAVENOUS

## 2020-04-24 MED ORDER — SODIUM CHLORIDE 0.9 % IV SOLN: INTRAVENOUS | Status: DC

## 2020-04-24 MED ORDER — EPHEDRINE SULFATE 50 MG/ML IJ SOLN
INTRAMUSCULAR | Status: DC | PRN
  Administered 2020-04-24: 10 mg via INTRAVENOUS

## 2020-04-24 MED ORDER — ORAL CARE MOUTH RINSE: 15.0000 mL | Freq: Once | OROMUCOSAL | Status: AC

## 2020-04-24 MED ORDER — BUPIVACAINE HCL (PF) 0.5 % IJ SOLN
INTRAMUSCULAR | Status: AC
  Filled 2020-04-24: qty 30

## 2020-04-24 MED ORDER — HYDROCODONE-ACETAMINOPHEN 7.5-325 MG PO TABS: 1.0000 | ORAL_TABLET | Freq: Four times a day (QID) | ORAL | 0 refills | Status: DC | PRN

## 2020-04-24 MED ORDER — ONDANSETRON HCL 4 MG/2ML IJ SOLN: 4.0000 mg | Freq: Once | INTRAMUSCULAR | Status: DC | PRN

## 2020-04-24 MED ORDER — MIDAZOLAM HCL 2 MG/2ML IJ SOLN
INTRAMUSCULAR | Status: DC | PRN
  Administered 2020-04-24: 2 mg via INTRAVENOUS

## 2020-04-24 MED ORDER — FENTANYL CITRATE (PF) 100 MCG/2ML IJ SOLN
25.0000 ug | INTRAMUSCULAR | Status: DC | PRN
  Administered 2020-04-24 (×3): 25 ug via INTRAVENOUS

## 2020-04-24 MED ORDER — SUCCINYLCHOLINE CHLORIDE 20 MG/ML IJ SOLN
INTRAMUSCULAR | Status: DC | PRN
  Administered 2020-04-24: 140 mg via INTRAVENOUS

## 2020-04-24 MED ORDER — LIDOCAINE HCL (PF) 2 % IJ SOLN
INTRAMUSCULAR | Status: AC
  Filled 2020-04-24: qty 5

## 2020-04-24 MED ORDER — ONDANSETRON HCL 4 MG/2ML IJ SOLN
INTRAMUSCULAR | Status: AC
  Filled 2020-04-24: qty 2

## 2020-04-24 MED ORDER — LIDOCAINE HCL (PF) 1 % IJ SOLN
INTRAMUSCULAR | Status: AC
  Filled 2020-04-24: qty 30

## 2020-04-24 MED ORDER — CHLORHEXIDINE GLUCONATE 0.12 % MT SOLN: 15.0000 mL | Freq: Once | OROMUCOSAL | Status: AC

## 2020-04-24 MED ORDER — CHLORHEXIDINE GLUCONATE 0.12 % MT SOLN
OROMUCOSAL | Status: AC
  Administered 2020-04-24: 15 mL via OROMUCOSAL
  Filled 2020-04-24: qty 15

## 2020-04-24 MED ORDER — LIDOCAINE HCL (CARDIAC) PF 100 MG/5ML IV SOSY
PREFILLED_SYRINGE | INTRAVENOUS | Status: DC | PRN
  Administered 2020-04-24: 80 mg via INTRAVENOUS

## 2020-04-24 MED ORDER — FENTANYL CITRATE (PF) 100 MCG/2ML IJ SOLN
INTRAMUSCULAR | Status: AC
  Administered 2020-04-24: 25 ug via INTRAVENOUS
  Filled 2020-04-24: qty 2

## 2020-04-24 MED ORDER — ONDANSETRON HCL 4 MG/2ML IJ SOLN
INTRAMUSCULAR | Status: DC | PRN
  Administered 2020-04-24: 4 mg via INTRAVENOUS

## 2020-04-24 MED ORDER — DEXAMETHASONE SODIUM PHOSPHATE 10 MG/ML IJ SOLN
INTRAMUSCULAR | Status: AC
  Filled 2020-04-24: qty 1

## 2020-04-24 MED ORDER — PROPOFOL 10 MG/ML IV BOLUS
INTRAVENOUS | Status: AC
  Filled 2020-04-24: qty 20

## 2020-04-24 MED ORDER — FENTANYL CITRATE (PF) 100 MCG/2ML IJ SOLN
INTRAMUSCULAR | Status: AC
  Filled 2020-04-24: qty 2

## 2020-04-24 MED ORDER — FENTANYL CITRATE (PF) 100 MCG/2ML IJ SOLN
INTRAMUSCULAR | Status: DC | PRN
  Administered 2020-04-24 (×2): 50 ug via INTRAVENOUS

## 2020-04-24 MED ORDER — SUCCINYLCHOLINE CHLORIDE 200 MG/10ML IV SOSY
PREFILLED_SYRINGE | INTRAVENOUS | Status: AC
  Filled 2020-04-24: qty 10

## 2020-04-24 MED ORDER — MIDAZOLAM HCL 2 MG/2ML IJ SOLN
INTRAMUSCULAR | Status: AC
  Filled 2020-04-24: qty 2

## 2020-04-24 MED ORDER — EPHEDRINE 5 MG/ML INJ
INTRAVENOUS | Status: AC
  Filled 2020-04-24: qty 10

## 2020-04-24 MED ORDER — PHENYLEPHRINE HCL (PRESSORS) 10 MG/ML IV SOLN
INTRAVENOUS | Status: AC
  Filled 2020-04-24: qty 1

## 2020-04-24 MED ORDER — PROPOFOL 10 MG/ML IV BOLUS
INTRAVENOUS | Status: DC | PRN
  Administered 2020-04-24: 200 mg via INTRAVENOUS

## 2020-04-24 MED ORDER — BUPIVACAINE HCL (PF) 0.5 % IJ SOLN
INTRAMUSCULAR | Status: DC | PRN
  Administered 2020-04-24: 10 mL

## 2020-04-24 MED ORDER — PHENYLEPHRINE HCL (PRESSORS) 10 MG/ML IV SOLN
INTRAVENOUS | Status: DC | PRN
  Administered 2020-04-24 (×4): 100 ug via INTRAVENOUS
  Administered 2020-04-24: 200 ug via INTRAVENOUS

## 2020-04-24 SURGICAL SUPPLY — 24 items
APL PRP STRL LF DISP 70% ISPRP (MISCELLANEOUS) ×1
BNDG CMPR STD VLCR NS LF 5.8X2 (GAUZE/BANDAGES/DRESSINGS) ×1
BNDG ELASTIC 2X5.8 VLCR NS LF (GAUZE/BANDAGES/DRESSINGS) ×3 IMPLANT
BNDG ELASTIC 2X5.8 VLCR STR LF (GAUZE/BANDAGES/DRESSINGS) ×3 IMPLANT
CAST PADDING 2X4YD ST 30245 (MISCELLANEOUS) ×2
CHLORAPREP W/TINT 26 (MISCELLANEOUS) ×3 IMPLANT
COVER WAND RF STERILE (DRAPES) ×3 IMPLANT
CUFF TOURN SGL QUICK 18X4 (TOURNIQUET CUFF) IMPLANT
GAUZE XEROFORM 1X8 LF (GAUZE/BANDAGES/DRESSINGS) ×3 IMPLANT
GLOVE SURG SYN 9.0  PF PI (GLOVE) ×2
GLOVE SURG SYN 9.0 PF PI (GLOVE) ×1 IMPLANT
GOWN SRG 2XL LVL 4 RGLN SLV (GOWNS) ×1 IMPLANT
GOWN STRL NON-REIN 2XL LVL4 (GOWNS) ×3
GOWN STRL REUS W/ TWL LRG LVL3 (GOWN DISPOSABLE) ×1 IMPLANT
GOWN STRL REUS W/TWL LRG LVL3 (GOWN DISPOSABLE) ×3
KIT TURNOVER KIT A (KITS) ×3 IMPLANT
NEEDLE HYPO 25X1 1.5 SAFETY (NEEDLE) ×3 IMPLANT
NS IRRIG 500ML POUR BTL (IV SOLUTION) ×3 IMPLANT
PACK EXTREMITY (MISCELLANEOUS) ×3 IMPLANT
PADDING CAST COTTON 2X4 ST (MISCELLANEOUS) ×1 IMPLANT
SCALPEL PROTECTED #15 DISP (BLADE) ×6 IMPLANT
SPONGE GAUZE 2X2 8PLY STER LF (GAUZE/BANDAGES/DRESSINGS) ×1
SPONGE GAUZE 2X2 8PLY STRL LF (GAUZE/BANDAGES/DRESSINGS) ×2 IMPLANT
SUT ETHILON 4 0 P 3 18 (SUTURE) ×3 IMPLANT

## 2020-04-24 NOTE — Anesthesia Preprocedure Evaluation (Addendum)
Anesthesia Evaluation  Patient identified by MRN, date of birth, ID band Patient awake    Reviewed: Allergy & Precautions, H&P , NPO status , Patient's Chart, lab work & pertinent test results, reviewed documented beta blocker date and time   History of Anesthesia Complications (+) DIFFICULT AIRWAY and history of anesthetic complications  Airway Mallampati: III  TM Distance: >3 FB Neck ROM: full    Dental  (+) Teeth Intact   Pulmonary neg pulmonary ROS, neg shortness of breath, asthma , sleep apnea , neg recent URI,    Pulmonary exam normal        Cardiovascular hypertension, (-) angina(-) Past MI and (-) Cardiac Stents Normal cardiovascular exam+ dysrhythmias + Valvular Problems/Murmurs  Rhythm:regular Rate:Normal     Neuro/Psych  Headaches, neg Seizures PSYCHIATRIC DISORDERS Anxiety Depression  Neuromuscular disease (fibromyalgia)    GI/Hepatic negative GI ROS, Neg liver ROS, hiatal hernia, GERD  ,  Endo/Other  diabetesHypothyroidism Morbid obesity  Renal/GU negative Renal ROS  negative genitourinary   Musculoskeletal  (+) Arthritis , Fibromyalgia -  Abdominal   Peds negative pediatric ROS (+)  Hematology negative hematology ROS (+) Blood dyscrasia, anemia ,   Anesthesia Other Findings Past Medical History:   Anginal pain ([facility])                                           Hypertension                                                 Dysrhythmia                                                  Asthma                                                       Sleep apnea                                                  Thyroid nodule                                  bilateral    Diabetes mellitus without complication ([facility])                 Depression                                                   Anxiety  GERD (gastroesophageal reflux disease)                        History of hiatal hernia                                     Headache                                                     Neuromuscular disorder ([facility])                                 Fibromyalgia                                                 Arthritis                                                    Anemia                                                       Obesity                                                      H/O cardiovascular disorder                     03/10/2015    H/O thyroid disease                             03/10/2015    H/O surgical procedure                          12/05/2012      Comment:Overview:  LSG ([facility] - April 2013)     Arthralgia of hip                               07/29/2015   Arthritis, degenerative                         07/29/2015   Gonalgia                                        07/29/2015     Comment:Overview:  Overview:  The patient has had  bilateral intra-articular Hyalgan injections               done on 07/16/2014 and although she seems to do              well with this type of therapy, apparently her               insurance company does not want to pay for they              Hyalgan. On 11/27/2014 the patient underwent a               bilateral genicular nerve block with excellent               results. On 01/28/2015 she had a right knee               genicular radiofrequency ablatio   Cephalalgia                                     07/25/2014   History of artificial joint                     07/29/2015   LBP (low back pain)                             07/29/2015   Primary osteoarthritis of both knees            07/29/2015   Dependence on unknown drug ([place])                               Comment:multiplt controlled drug dependence   Hypomagnesemia                                               Gout                                                         Herpes                                                       PCOS (polycystic  ovarian syndrome)                           Eczema                                                       Hypothyroidism  Past Surgical History:   LAPAROSCOPIC PARTIAL GASTRECTOMY                              SHOULDER ARTHROSCOPY                            Right              JOINT REPLACEMENT                               Left hip          CARPAL TUNNEL RELEASE                           Bilateral              DIAGNOSTIC LAPAROSCOPY                                        CHOLECYSTECTOMY                                               TRIGGER FINGER RELEASE                          Right            BMI    Body Mass Index   71.88 kg/m 2     Reproductive/Obstetrics negative OB ROS                            Anesthesia Physical  Anesthesia Plan  ASA: III  Anesthesia Plan: General   Post-op Pain Management:    Induction: Intravenous, Rapid sequence and Cricoid pressure planned  PONV Risk Score and Plan: 2  Airway Management Planned:   Additional Equipment:   Intra-op Plan:   Post-operative Plan: Extubation in OR  Informed Consent: I have reviewed the patients History and Physical, chart, labs and discussed the procedure including the risks, benefits and alternatives for the proposed anesthesia with the patient or authorized representative who has indicated his/her understanding and acceptance.     Dental Advisory Given  Plan Discussed with: CRNA and Surgeon  Anesthesia Plan Comments: (Patient refused block and prefers GOT)      Anesthesia Quick Evaluation

## 2020-04-24 NOTE — H&P (Signed)
. Post Operative Visit  S/P Rt Middle and Ring trigger release 02/25/20   History of the Present Illness: Karen Lewis is a 49 y.o. female here for follow-up evaluation 5 weeks status post right long and ring trigger finger releases performed on 02/25/2020. She reports that it has been ineffective, and she is having persistent triggering in both fingers.  The patient states her past finger injections have not provided relief. She is unable to wear her rings. With her last surgery, the patient did not tolerate the local anesthetic and tourniquet very well.   The patient is on Jardiance. Her hemoglobin A1c is 8.4. She sees Mee Hives, MD, and just had an appointment last week.  I have reviewed past medical, surgical, social and family history, and allergies as documented in the EMR.  Past Medical History: Past Medical History:  Diagnosis Date  . Acid reflux  . Allergic state  . Anxiety  . Arthritis  . Asthma without status asthmaticus  . Chickenpox  . Depression  . Diabetes mellitus type 2, uncomplicated (CMS-HCC)  . Eczema  . Elevated LFTs 06/07/2019  . Fibromyalgia  . Gastroesophageal reflux disease without esophagitis 05/04/2016  . Gout  . Hyperlipidemia  . Hypertension  . IDA (iron deficiency anemia) 05/28/2019  . Migraines  . Neuro-degenerative disorders (CMS-HCC)  . Obese  . Polycystic ovarian syndrome  . Sleep apnea  . Thrombophlebitis of iliac vein, unspecified laterality (CMS-HCC) 05/04/2016  . Thyroid disease  nodules  . Venereal disease  Herpes   Past Surgical History: Past Surgical History:  Procedure Laterality Date  . Bilateral carpal tunnel release  . CHOLECYSTECTOMY  . ESOPHAGOGASTRODOUDENOSCOPY W/BIOPSY 11/19/2013  Procedure: [id number] W/BIOPSY; Surgeon: Estill Batten, MD; Location: [facility]; Service: Gastroenterology;;  . INCISION TENDON SHEATH FOR TRIGGER FINGER Right 02/25/2020  Dr. Rudene Christians (Middle finger)  . JOINT REPLACEMENT  03-22-2013  TOTAL L HIP  . KNEE ARTHROSCOPY 05-2000  R SHOULDER W/MUMMFORD  . Laparoscopic Sleeve Gastrectomy N/A 12/13/2011  . REPLACEMENT TOTAL HIP W/ RESURFACING IMPLANTS Left 05/06/2352  complicated by MRSA infection  . Right Trigger Thumb Release  . SHOULDER ARTHROSCOPY W/ ACROMIAL REPAIR Right  . THYROIDECTOMY TOTAL 11/12/2015  . Trigger Finger Release Left 11/17/2016  Dr.Briceson Broadwater   Past Family History: Family History  Problem Relation Age of Onset  . Diabetes type II Mother  . High blood pressure (Hypertension) Mother  . Renal cell carcinoma Mother  . Coronary Artery Disease (Blocked arteries around heart) Mother  . Sleep apnea Mother  . Migraines Mother  . Kidney cancer Mother  . Lung cancer Mother  . Heart disease Mother  . COPD Mother  . Obesity Mother  . Allergies Mother  . Asthma Mother  . Mental illness Mother  . Clotting disorder Mother  . Arthritis Mother  . Alcohol abuse Mother  . Allergic rhinitis Mother  . Anxiety Mother  . Deep vein thrombosis (DVT or abnormal blood clot formation) Mother  . Depression Mother  . Hyperlipidemia (Elevated cholesterol) Mother  . Osteoarthritis Mother  . Osteoporosis (Thinning of bones) Mother  . High blood pressure (Hypertension) Father  RENAL FX  . Diabetes type II Father  . Peripheral vascular disease Father  . Kidney failure Father  . Sleep apnea Father  . Heart disease Father  . COPD Father  . Obesity Father  . Allergies Father  . Mental illness Father  . Arthritis Father  . Alcohol abuse Father  . Coronary Artery Disease (Blocked arteries around heart) Father  .  Depression Father  . Hyperlipidemia (Elevated cholesterol) Father  . Osteoarthritis Father  . High blood pressure (Hypertension) Brother  . Obesity Brother  . Diabetes type II Brother  . Asthma Brother  . Allergies Brother  . Arthritis Brother  . Osteoporosis (Thinning of bones) Maternal Grandmother  . Arthritis Maternal Grandmother  . Diabetes  Maternal Grandmother  . Deep vein thrombosis (DVT or abnormal blood clot formation) Maternal Grandmother  died from PE  . Depression Maternal Grandmother  . Diabetes type II Maternal Grandmother  . Obesity Maternal Grandmother  . Osteoarthritis Maternal Grandmother  . Emphysema Maternal Grandfather  . Asthma Maternal Grandfather  . Aortic aneurysm Maternal Grandfather  COD  . Alzheimer's disease Maternal Grandfather  . Diabetes Maternal Grandfather  . Allergic rhinitis Maternal Grandfather  . Diabetes type II Maternal Grandfather  . Obesity Maternal Grandfather  . High blood pressure (Hypertension) Paternal Grandmother  DIALYSIS  . Diabetes Paternal Grandmother  . Kidney failure Paternal Grandmother  . Osteoarthritis Paternal Grandmother  . Diabetes type II Paternal Grandmother  . Obesity Paternal Grandmother  . High blood pressure (Hypertension) Paternal Grandfather  . Diabetes Paternal Grandfather  . Diabetes type II Paternal Grandfather  . Obesity Paternal Grandfather   Medications: Current Outpatient Medications Ordered in Epic  Medication Sig Dispense Refill  . acetaminophen (TYLENOL) 325 MG tablet Take 650 mg by mouth every 4 (four) hours as needed for Pain  . acyclovir (ZOVIRAX) 800 MG tablet Take 1 tablet (800 mg total) by mouth 5 (five) times daily 150 tablet 0  . albuterol (PROVENTIL) 2.5 mg /3 mL (0.083 %) nebulizer solution Take 3 mLs (2.5 mg total) by nebulization every 6 (six) hours as needed for Wheezing 75 mL 3  . albuterol 90 mcg/actuation inhaler Inhale 2 inhalations into the lungs every 6 (six) hours as needed for Wheezing or Shortness of Breath 1 Inhaler 5  . amitriptyline (ELAVIL) 100 MG tablet TAKE 1 TABLET NIGHTLY 90 tablet 0  . ascorbic acid, vitamin C, (VITAMIN C) 500 MG tablet Take 1,000 mg by mouth once daily  . azelastine (ASTELIN) 137 mcg nasal spray Place 1 spray into both nostrils 2 (two) times daily 10 mL 1  . benzonatate (TESSALON) 200 MG capsule  Take 1 capsule (200 mg total) by mouth 3 (three) times daily as needed for Cough 30 capsule 1  . BIOTIN ORAL Take 1 capsule by mouth once daily  . butalbital-acetaminophen-caffeine (FIORICET) 50-325-40 mg tablet TAKE (1) TABLET BY MOUTH EVERY SIX HOURS AS NEEDED FOR PAIN. 30 tablet 0  . calcium carbonate (TUMS) 200 mg calcium (500 mg) chewable tablet Take 1 tablet by mouth once daily  . cephalexin (KEFLEX) 500 MG capsule Please take 4 capsules 30 minutes to 1 hour prior to dental procedure. 4 capsule 10  . clonazePAM (KLONOPIN) 0.5 MG tablet Take 1 tablet (0.5 mg total) by mouth 3 (three) times daily as needed for Anxiety 60 tablet 5  . cyanocobalamin (VITAMIN B12) 100 MCG tablet Take 1 tablet (100 mcg total) by mouth once daily 30 tablet 0  . diphenhydrAMINE (BENADRYL) 25 mg capsule Take 25 mg by mouth as needed for Itching  . empagliflozin (JARDIANCE) 25 mg tablet Take 1 tablet (25 mg total) by mouth daily with breakfast 30 tablet 11  . EPINEPHrine (EPIPEN) 0.3 mg/0.3 mL pen injector Inject 0.3 mg into the muscle once as needed  . ergocalciferol, vitamin D2, 1,250 mcg (50,000 unit) capsule Take 1 capsule (50,000 Units total) by mouth twice a  week 25 capsule 4  . famotidine (PEPCID) 40 MG tablet Take 1 tablet (40 mg total) by mouth 2 (two) times daily 60 tablet 0  . Lacto.acidophilus-Bif.animalis 32 billion cell Cap Take 1 capsule by mouth once daily  . lancing device Misc Use 1 each as directed. Accu Chek Softclix. DX: E11.8 1 each 0  . levocetirizine (XYZAL) 5 MG tablet TAKE 1 TABLET EVERY EVENING 90 tablet 3  . levothyroxine (SYNTHROID) 125 MCG tablet TAKE 2 TABLETS ONE TIME DAILY ON AN EMPTY STOMACH WITH A GLASS OF WATER AT LEAST 30 TO 60 MINUTES BEFORE BREAKFAST. 180 tablet 3  . lisinopriL (ZESTRIL) 5 MG tablet TAKE 1 TABLET (5 MG TOTAL) BY MOUTH ONCE DAILY 90 tablet 3  . magnesium oxide (MAG-OX) 400 mg (241.3 mg magnesium) tablet Three tabs every morning, two tabs every evening 150 tablet 12   . meclizine (ANTIVERT) 25 mg tablet Take 1 tablet by mouth once daily  . medroxyPROGESTERone (DEPO-PROVERA) 150 mg/mL injection Inject 1 mL (150 mg total) into the muscle every 3 (three) months 1 mL 3  . metFORMIN (GLUCOPHAGE) 1000 MG tablet TAKE 1 TABLET (1,000 MG TOTAL) BY MOUTH 2 (TWO) TIMES DAILY WITH MEALS 180 tablet 3  . metoprolol tartrate (LOPRESSOR) 50 MG tablet Take 1.5 tablets (75 mg total) by mouth 2 (two) times daily 270 tablet 3  . montelukast (SINGULAIR) 10 mg tablet TAKE 1 TABLET EVERY DAY 90 tablet 1  . naphazoline-pheniramine (NAPHCON-A) 0.025-0.3 % ophthalmic solution Place 1 drop into both eyes once daily as needed  . naproxen sodium (ALEVE) 220 MG tablet Take 440 mg by mouth once daily as needed for Pain  . NARCAN 4 mg/actuation nasal spray by Nasal route as directed  . omeprazole (PRILOSEC) 40 MG DR capsule TAKE 1 CAPSULE TWICE DAILY 180 capsule 3  . oxyCODONE (ROXICODONE) 5 MG immediate release tablet Take 5 mg by mouth every 4 (four) hours as needed for Pain.  . pregabalin (LYRICA) 225 MG capsule Take 225 mg by mouth 2 (two) times daily  . QUEtiapine (SEROQUEL) 100 MG tablet TAKE 1 TABLET (100 MG TOTAL) BY MOUTH NIGHTLY 90 tablet 3  . rosuvastatin (CRESTOR) 40 MG tablet TAKE 1 TABLET (40 MG TOTAL) BY MOUTH ONCE DAILY 90 tablet 3  . sertraline (ZOLOFT) 100 MG tablet TAKE 2 TABLETS EVERY DAY 180 tablet 3  . spironolactone (ALDACTONE) 25 MG tablet Take 1 tablet (25 mg total) by mouth 3 (three) times daily 270 tablet 4  . TAB-A-VITE tablet TAKE (2) TABLETS BY MOUTH ONCE DAILY. 60 tablet 0  . tiotropium bromide (SPIRIVA RESPIMAT) 2.5 mcg/actuation inhalation spray Inhale 2 inhalations (5 mcg total) into the lungs once daily 4 g 4  . tiZANidine (ZANAFLEX) 4 MG tablet TAKE 1 TABLET TWICE DAILY AS NEEDED 180 tablet 1  . triamcinolone (NASACORT AQ) 55 mcg nasal spray Place 2 sprays into both nostrils once daily 1 Inhaler 12  . triamcinolone 0.5 % cream Apply topically 2 (two)  times daily Apply 2 x per day (up to 7-10 days) 30 g 0  . zolpidem (AMBIEN) 10 mg tablet Take 1 tablet (10 mg total) by mouth nightly as needed 30 tablet 5  . zonisamide (ZONEGRAN) 50 MG capsule TAKE 3 CAPSULES ONE TIME DAILY 270 capsule 1  . alcohol swabs (ALCOHOL PREP PADS) PadM Apply 1 each topically 2 (two) times daily Use when checking BG. DX: E11.8 200 each 3  . blood glucose diagnostic (ACCU-CHEK AVIVA PLUS TEST STRP) test  strip Use 2 (two) times daily 100 each 5   No current Epic-ordered facility-administered medications on file.   Allergies: Allergies  Allergen Reactions  . Ciprofloxacin Muscle Pain  Possible myalgias.  . Shellfish Containing Products Other (See Comments)  SKIN TEST WAS POSITIVE  . Trimethoprim Hives  . Aspirin Other (See Comments)  GI UPSET  . Bactrim [Sulfamethoxazole-Trimethoprim] Hives  . Motrin [Ibuprofen] Other (See Comments)  GI UPSET  . Nsaids (Non-Steroidal Anti-Inflammatory Drug) Nausea and Other (See Comments)  Stomach upset Other reaction(s): Other (See Comments) Stomach upset  . Sulfa (Sulfonamide Antibiotics) Hives  . Xolair [Omalizumab] Itching    Body mass index is 62.89 kg/m.  Review of Systems: A comprehensive 14 point ROS was performed, reviewed, and the pertinent orthopaedic findings are documented in the HPI.  There were no vitals filed for this visit.  General Physical Examination:  General/Constitutional: No apparent distress: well-nourished and well developed. Eyes: Pupils equal, round with synchronous movement. Lungs: Clear to auscultation HEENT: Normal Vascular: No edema, swelling or tenderness, except as noted in detailed exam. Cardiac: Heart rate and rhythm is regular. Integumentary: No impressive skin lesions present, except as noted in detailed exam. Neuro/Psych: Normal mood and affect, oriented to person, place and time.  Musculoskeletal Examination: On exam, right long and ring finger triggering with  crepitation. Right ring and long fingers have well-healed scars. Lungs are clear. Heart rate and rhythm is normal. HEENT is normal.  Radiographs: No new imaging studies were obtained or reviewed today.  Assessment: ICD-10-CM  1. Acquired trigger finger of right middle finger M65.331  2. Acquired trigger finger of right ring finger M65.341  3. S/P trigger finger release Z98.890   Plan: The patient has clinical findings of failed right ring and long trigger finger releases.  We discussed the patient 's treatment options. We will plan for repeat right ring and long trigger finger releases in the hospital under general anesthetic.  The patient will be scheduled for surgery sometime this month.   Surgical Risks:  The nature of the condition and the proposed procedure has been reviewed in detail with the patient. Surgical versus non-surgical options and prognosis for recovery have been reviewed and the inherent risks and benefits of each have been discussed including the risks of infection, bleeding, injury to nerves/blood vessels/tendons, incomplete relief of symptoms, persisting pain and/or stiffness, loss of function, complex regional pain syndrome, failure of the procedure, as appropriate.  Teeth: Normal  Scribe Attestation: I, Dawn Royse, am acting as scribe for [employer], MD    Electronically signed by Lauris Poag, MD at 04/03/2020 8:25 PM EDT   Reviewed paper H+P, will be scanned into chart. No changes noted.

## 2020-04-24 NOTE — Anesthesia Postprocedure Evaluation (Signed)
Anesthesia Post Note  Patient: Karen Lewis  Procedure(s) Performed: Right ring and middle trigger release (Right )  Patient location during evaluation: PACU Anesthesia Type: General Level of consciousness: awake and alert and oriented Pain management: pain level controlled Vital Signs Assessment: post-procedure vital signs reviewed and stable Respiratory status: spontaneous breathing Cardiovascular status: blood pressure returned to baseline Anesthetic complications: no   No complications documented.   Last Vitals:  Vitals:   04/24/20 1104 04/24/20 1115  BP:  112/70  Pulse: 80 81  Resp: 15 18  Temp: (!) 36.1 C 36.4 C  SpO2: 96% 97%    Last Pain:  Vitals:   04/24/20 1115  TempSrc: Tympanic  PainSc: Asleep                 Mataio Mele

## 2020-04-24 NOTE — Discharge Instructions (Addendum)
Loosen Ace wrap prior to dismissal. Work on finger motion is much as you can. Keep dressing clean and dry until follow-up visit. Pain medicine as directed.   AMBULATORY SURGERY  DISCHARGE INSTRUCTIONS   1) The drugs that you were given will stay in your system until tomorrow so for the next 24 hours you should not:  A) Drive an automobile B) Make any legal decisions C) Drink any alcoholic beverage   2) You may resume regular meals tomorrow.  Today it is better to start with liquids and gradually work up to solid foods.  You may eat anything you prefer, but it is better to start with liquids, then soup and crackers, and gradually work up to solid foods.   3) Please notify your doctor immediately if you have any unusual bleeding, trouble breathing, redness and pain at the surgery site, drainage, fever, or pain not relieved by medication.    4) Additional Instructions:        Please contact your physician with any problems or Same Day Surgery at [phone], Monday through Friday 6 am to 4 pm, or  at [facility] number at [phone].

## 2020-04-24 NOTE — Anesthesia Procedure Notes (Signed)
Procedure Name: Intubation Date/Time: 04/24/2020 9:14 AM Performed by: Lerry Liner, CRNA Pre-anesthesia Checklist: Patient identified, Emergency Drugs available, Suction available, Patient being monitored and Timeout performed Patient Re-evaluated:Patient Re-evaluated prior to induction Oxygen Delivery Method: Circle system utilized Preoxygenation: Pre-oxygenation with 100% oxygen Induction Type: IV induction Ventilation: Mask ventilation without difficulty Laryngoscope Size: McGraph and 3 Grade View: Grade I Tube type: Oral Tube size: 6.5 mm Number of attempts: 1 Airway Equipment and Method: Patient positioned with wedge pillow and Stylet Placement Confirmation: ETT inserted through vocal cords under direct vision,  positive ETCO2 and breath sounds checked- equal and bilateral Secured at: 22 cm Tube secured with: Tape Dental Injury: Teeth and Oropharynx as per pre-operative assessment

## 2020-04-24 NOTE — Transfer of Care (Signed)
Immediate Anesthesia Transfer of Care Note  Patient: Karen Lewis  Procedure(s) Performed: Right ring and middle trigger release (Right )  Patient Location: PACU  Anesthesia Type:General  Level of Consciousness: drowsy  Airway & Oxygen Therapy: Patient Spontanous Breathing and Patient connected to face mask oxygen  Post-op Assessment: Report given to RN  Post vital signs: stable  Last Vitals:  Vitals Value Taken Time  BP 110/65 04/24/20 1006  Temp 36.2 C 04/24/20 1006  Pulse 86 04/24/20 1007  Resp 22 04/24/20 1007  SpO2 98 % 04/24/20 1007  Vitals shown include unvalidated device data.  Last Pain:  Vitals:   04/24/20 0742  TempSrc: Tympanic  PainSc: 5          Complications: No complications documented.

## 2020-04-24 NOTE — Op Note (Signed)
04/24/2020  10:11 AM  PATIENT:  Karen Lewis  49 y.o. female  PRE-OPERATIVE DIAGNOSIS:  Acquired trigger finger of right middle finger M65.331 Acquired trigger finger of right ring finger M65.341  POST-OPERATIVE DIAGNOSIS:  Acquired trigger finger of right middle finger M65.331  PROCEDURE:  Procedure(s): Right ring and middle trigger release (Right)  SURGEON: Laurene Footman, MD  ASSISTANTS: None  ANESTHESIA:   general  EBL:  Total I/O In: 50 [IV Piggyback:50] Out: -   BLOOD ADMINISTERED:none  DRAINS: none   LOCAL MEDICATIONS USED:  MARCAINE     SPECIMEN:  No Specimen  DISPOSITION OF SPECIMEN:  N/A  COUNTS:  YES  TOURNIQUET: 28 minutes at 250 mmHg  IMPLANTS: None  DICTATION: .Dragon Dictation patient brought the operating room and after adequate anesthesia was obtained a tourniquet was applied to the upper forearm and the hand and forearm prepped and draped in the usual sterile fashion.  After patient identification and timeout procedures were completed tourniquet was raised.  Prior incisions were opened and subcutaneous tissue spread with extensive scar tissue being present.  There had been complete covering of the tendon with new scar tissue and the A1 pulley was apparently still intact at the most proximal extent.  It was with some difficulty this could all be visualized because of the extensive scar tissue.  Care was taken to preserve neurovascular bundles in each finger.  The middle finger had some significant wear to the flexor tendon volarly but nothing to be debrided.  After complete release plate finger could be placed to range of motion with no longer triggering.  Going to the ring finger identical procedure again with the most proximal extent of the A1 pulley appearing to be the residual factor tendon itself was in better condition in the middle finger and with no triggering through passive range of motion after complete release of the pulley the wounds were irrigated  and 10 cc of half percent Sensorcaine infiltrated into the incisions 5 cc to each finger followed by simple interrupted wound closure.  Dressings of Xeroform 4 x 4 web roll and Ace wrap applied and tourniquet let down  PLAN OF CARE: Discharge to home after PACU  PATIENT DISPOSITION:  PACU - hemodynamically stable.

## 2020-04-28 DIAGNOSIS — R300 Dysuria: Secondary | ICD-10-CM | POA: Diagnosis not present

## 2020-05-02 DIAGNOSIS — E119 Type 2 diabetes mellitus without complications: Secondary | ICD-10-CM | POA: Diagnosis not present

## 2020-05-02 DIAGNOSIS — E785 Hyperlipidemia, unspecified: Secondary | ICD-10-CM | POA: Diagnosis not present

## 2020-05-02 DIAGNOSIS — I152 Hypertension secondary to endocrine disorders: Secondary | ICD-10-CM | POA: Diagnosis not present

## 2020-05-02 DIAGNOSIS — E1159 Type 2 diabetes mellitus with other circulatory complications: Secondary | ICD-10-CM | POA: Diagnosis not present

## 2020-05-02 DIAGNOSIS — E1169 Type 2 diabetes mellitus with other specified complication: Secondary | ICD-10-CM | POA: Diagnosis not present

## 2020-05-06 ENCOUNTER — Other Ambulatory Visit: Payer: Self-pay

## 2020-05-06 NOTE — Patient Outreach (Signed)
[facility]) Care Management  [place]  05/06/2020   Karen Lewis 1971-01-06 [id number]  Subjective: Spoke with patient. She reports having some trouble with headaches which is an off and on issue. Patient taking medication and working through headaches. Patient recently had trigger finger surgery completed.  She states thing are working well. Patient able to ambulate further without assistive devices. Patient reports her sugars are in the 120-170 range with some lows with taking Glipizide.  Discussed diabetic control and eating regular meals. She verbalized understanding.     Objective:   Encounter Medications:  Outpatient Encounter Medications as of 05/06/2020  Medication Sig Note  . ACCU-CHEK AVIVA PLUS test strip    . acetaminophen (TYLENOL) 650 MG CR tablet Take 650-1,300 mg by mouth every 8 (eight) hours as needed for pain.   Marland Kitchen acidophilus (RISAQUAD) CAPS capsule Take 1 capsule by mouth daily.   Marland Kitchen albuterol (PROVENTIL HFA;VENTOLIN HFA) 108 (90 BASE) MCG/ACT inhaler Inhale 2 puffs into the lungs every 6 (six) hours as needed for wheezing or shortness of breath.   Marland Kitchen amitriptyline (ELAVIL) 100 MG tablet Take 100 mg by mouth at bedtime.   . Ascorbic Acid (VITAMIN C) 1000 MG tablet Take 1,000 mg by mouth daily.    . Biotin 10 MG CAPS Take 10 mg by mouth daily.    . butalbital-acetaminophen-caffeine (FIORICET) 50-325-40 MG tablet Take 1 tablet by mouth every 6 (six) hours as needed for migraine.  (Patient not taking: Reported on 04/21/2020)   . calcium carbonate (TUMS EX) 750 MG chewable tablet Chew 1 tablet by mouth daily.    . clonazePAM (KLONOPIN) 0.5 MG tablet Take 1 tablet (0.5 mg total) by mouth 3 (three) times daily as needed for anxiety.   . Cyanocobalamin (B-12) 2500 MCG TABS Take 2,500 mcg by mouth daily.   . diclofenac sodium (VOLTAREN) 1 % GEL Apply 2 g topically 4 (four) times daily. (Patient taking differently: Apply 2 g topically 3 (three) times daily  as needed (pain). )   . diphenhydrAMINE (BENADRYL) 25 MG tablet Take 25 mg by mouth every 8 (eight) hours as needed for itching or allergies.    Marland Kitchen empagliflozin (JARDIANCE) 25 MG TABS tablet Take 25 mg by mouth daily.   Marland Kitchen EPINEPHrine 0.3 mg/0.3 mL IJ SOAJ injection Inject 0.3 mg into the muscle as needed for anaphylaxis.  (Patient not taking: Reported on 04/21/2020)   . ergocalciferol (VITAMIN D2) 1.25 MG (50000 UT) capsule Take 50,000 Units by mouth 2 (two) times a week.    . famotidine (PEPCID) 20 MG tablet Take 20 mg by mouth at bedtime.    Marland Kitchen glucose blood (ACCU-CHEK AVIVA PLUS) test strip Use 2 (two) times daily   . HYDROcodone-acetaminophen (NORCO) 7.5-325 MG tablet Take 1 tablet by mouth every 6 (six) hours as needed for moderate pain.   Marland Kitchen levocetirizine (XYZAL) 5 MG tablet Take 5 mg by mouth at bedtime.    Marland Kitchen levothyroxine (SYNTHROID) 125 MCG tablet Take 250 mcg by mouth daily before breakfast.    . lisinopril (ZESTRIL) 5 MG tablet Take 5 mg by mouth at bedtime.    . magnesium oxide (MAG-OX) 400 MG tablet Take 800-1,200 mg by mouth See admin instructions. Take 1200 mg by mouth in the morning and 800 mg at bedtime   . meclizine (ANTIVERT) 25 MG tablet Take 25 mg by mouth 3 (three) times daily as needed for dizziness.    . medroxyPROGESTERone (DEPO-PROVERA) 150 MG/ML injection Inject  150 mg into the muscle every 3 (three) months.    . metFORMIN (GLUCOPHAGE) 1000 MG tablet Take 1,000 mg by mouth 2 (two) times daily with a meal.    . metoprolol tartrate (LOPRESSOR) 50 MG tablet Take 75 mg by mouth 2 (two) times daily.    . montelukast (SINGULAIR) 10 MG tablet Take 10 mg by mouth at bedtime.    . Multiple Vitamin (MULTI-VITAMIN) tablet Take 2 tablets by mouth daily.   . naloxone (NARCAN) nasal spray 4 mg/0.1 mL Spray into one nostril. Repeat with second device into other nostril after 2-3 minutes if no or minimal response. (Patient not taking: Reported on 04/21/2020)   . naphazoline-pheniramine  (NAPHCON-A) 0.025-0.3 % ophthalmic solution Place 1 drop into both eyes 4 (four) times daily as needed for eye irritation.   Marland Kitchen omeprazole (PRILOSEC) 40 MG capsule Take 40 mg by mouth in the morning and at bedtime.   Marland Kitchen oxyCODONE (OXY IR/ROXICODONE) 5 MG immediate release tablet Take 1 tablet (5 mg total) by mouth 5 (five) times daily. Must last 30 days   . oxyCODONE (OXY IR/ROXICODONE) 5 MG immediate release tablet Take 1 tablet (5 mg total) by mouth 5 (five) times daily. Must last 30 days 04/14/2020: Has not filled yet   . [START ON 05/16/2020] oxyCODONE (OXY IR/ROXICODONE) 5 MG immediate release tablet Take 1 tablet (5 mg total) by mouth 5 (five) times daily. Must last 30 days (Patient not taking: Reported on 04/21/2020) 04/14/2020: Has not filled yet  . pregabalin (LYRICA) 225 MG capsule Take 1 capsule (225 mg total) by mouth 2 (two) times daily.   . QUEtiapine (SEROQUEL) 100 MG tablet Take 100 mg by mouth at bedtime.   . rosuvastatin (CRESTOR) 40 MG tablet Take 40 mg by mouth daily.    . sertraline (ZOLOFT) 100 MG tablet Take 200 mg by mouth daily.    Marland Kitchen spironolactone (ALDACTONE) 25 MG tablet Take 37.5 mg by mouth 2 (two) times daily.    Marland Kitchen tiZANidine (ZANAFLEX) 4 MG tablet Take 1 tablet (4 mg total) by mouth 2 (two) times daily as needed for muscle spasms.   Marland Kitchen topiramate (TOPAMAX) 50 MG tablet Take 50 mg by mouth 2 (two) times daily.   Marland Kitchen triamcinolone (NASACORT AQ) 55 MCG/ACT AERO nasal inhaler Place 2 sprays into the nose 2 (two) times daily as needed (allergies).    . zolpidem (AMBIEN) 10 MG tablet Take 10 mg by mouth at bedtime as needed for sleep.   Marland Kitchen zonisamide (ZONEGRAN) 50 MG capsule Take 150 mg by mouth at bedtime.     No facility-administered encounter medications on file as of 05/06/2020.    Functional Status:  In your present state of health, do you have any difficulty performing the following activities: 04/18/2020 12/14/2019  Hearing? - N  Vision? - N  Difficulty concentrating or making  decisions? - N  Comment - -  Walking or climbing stairs? Y Y  Comment - Right Hip Replacement.  Dressing or bathing? - Y  Comment - Right Hip Replacement.  Doing errands, shopping? N Y  Comment - Right Hip Replacement.  Preparing Food and eating ? - Y  Comment - Right Hip Replacement.  Using the Toilet? - Y  Comment - Right Hip Replacement.  In the past six months, have you accidently leaked urine? - N  Do you have problems with loss of bowel control? - N  Managing your Medications? - N  Managing your Finances? - N  Housekeeping  or managing your Housekeeping? - Y  Comment - Right Hip Replacement.  Some recent data might be hidden    Fall/Depression Screening: Fall Risk  04/17/2020 04/08/2020 03/13/2020  Falls in the past year? 0 0 0  Number falls in past yr: - - -  Comment - - -  Injury with Fall? - - -  Comment - - -  Risk Factor Category  - - -  Risk for fall due to : - - -  Risk for fall due to: Comment - - -  Follow up - - -   PHQ 2/9 Scores 04/17/2020 04/08/2020 03/13/2020 12/14/2019 12/13/2019 04/27/2018 04/13/2018  PHQ - 2 Score 0 0 0 4 4 0 0  PHQ- 9 Score - - - 12 12 - -  Exception Documentation - - - - - - -    Assessment: Patient continues to manage chronic conditions and follows up with physicians as scheduled.    Plan:  [facility] CM Care Plan Problem One     Most Recent Value  Care Plan Problem One Knowledge deficit diabetes  Role Documenting the Problem One Care Management Telephonic Coordinator  Care Plan for Problem One Active  [facility] Long Term Goal  Over the next 90 days, patient will demonstrate and/or verbalize understanding of self-health management for long term care of Diabetes.   [facility] Long Term Goal Start Date 05/06/20  Interventions for Problem One Long Term Goal Patient monitoring sugars and taking medications as prescribed, and watching her diet.    [facility] CM Short Term Goal #2  Patient will keep A1c less than 7.0 within 30 days.  [facility] CM Short Term Goal #2 Start Date  05/06/20  Interventions for Short Term Goal #2 Patient working to lower A1c. Patient limiting carbohdrates.  Encouraged exercise.       RN CM will contact in the month of October and patient agreeable.   Jone Baseman, RN, MSN [place] [facility] Care Management Coordinator Direct Line [phone] Cell [phone] Toll Free: [phone]  Fax: [phone]

## 2020-05-07 ENCOUNTER — Other Ambulatory Visit: Payer: Self-pay

## 2020-05-07 NOTE — Patient Outreach (Addendum)
[place] [name] Care Management  05/07/2020  Karen Lewis 10/09/1970 [id number]  Outreach made to the patient to complete satisfaction survey. No answer. Left message for a call back.  [place] Management Assistant [phone]  2:49 pm spoke with patient and completed satisfaction survey

## 2020-05-08 DIAGNOSIS — R519 Headache, unspecified: Secondary | ICD-10-CM | POA: Diagnosis not present

## 2020-05-08 DIAGNOSIS — B373 Candidiasis of vulva and vagina: Secondary | ICD-10-CM | POA: Diagnosis not present

## 2020-05-14 DIAGNOSIS — L03211 Cellulitis of face: Secondary | ICD-10-CM | POA: Diagnosis not present

## 2020-05-15 DIAGNOSIS — G894 Chronic pain syndrome: Secondary | ICD-10-CM | POA: Diagnosis not present

## 2020-05-15 DIAGNOSIS — M797 Fibromyalgia: Secondary | ICD-10-CM | POA: Diagnosis not present

## 2020-05-27 ENCOUNTER — Ambulatory Visit: Payer: Medicare HMO | Admitting: Pain Medicine

## 2020-05-28 ENCOUNTER — Telehealth: Payer: Self-pay

## 2020-05-28 NOTE — Telephone Encounter (Signed)
Received Long term disability request form from [employer]. Per Dr. Tasia Catchings, [place] not appropriate for diagnosis of IDA. Form faxed back to [employer] informing them of this.

## 2020-06-04 DIAGNOSIS — G932 Benign intracranial hypertension: Secondary | ICD-10-CM | POA: Diagnosis not present

## 2020-06-05 ENCOUNTER — Ambulatory Visit: Payer: Medicare HMO | Attending: Pain Medicine | Admitting: Anesthesiology

## 2020-06-05 ENCOUNTER — Other Ambulatory Visit: Payer: Self-pay

## 2020-06-05 DIAGNOSIS — M25512 Pain in left shoulder: Secondary | ICD-10-CM

## 2020-06-05 DIAGNOSIS — M25562 Pain in left knee: Secondary | ICD-10-CM

## 2020-06-05 DIAGNOSIS — G894 Chronic pain syndrome: Secondary | ICD-10-CM | POA: Diagnosis not present

## 2020-06-05 DIAGNOSIS — M47817 Spondylosis without myelopathy or radiculopathy, lumbosacral region: Secondary | ICD-10-CM

## 2020-06-05 DIAGNOSIS — M797 Fibromyalgia: Secondary | ICD-10-CM

## 2020-06-05 DIAGNOSIS — M5412 Radiculopathy, cervical region: Secondary | ICD-10-CM

## 2020-06-05 DIAGNOSIS — M174 Other bilateral secondary osteoarthritis of knee: Secondary | ICD-10-CM

## 2020-06-05 DIAGNOSIS — M545 Low back pain, unspecified: Secondary | ICD-10-CM

## 2020-06-05 DIAGNOSIS — Z6841 Body Mass Index (BMI) 40.0 and over, adult: Secondary | ICD-10-CM

## 2020-06-05 DIAGNOSIS — M792 Neuralgia and neuritis, unspecified: Secondary | ICD-10-CM

## 2020-06-05 DIAGNOSIS — R200 Anesthesia of skin: Secondary | ICD-10-CM

## 2020-06-05 DIAGNOSIS — M25561 Pain in right knee: Secondary | ICD-10-CM

## 2020-06-05 DIAGNOSIS — M79602 Pain in left arm: Secondary | ICD-10-CM

## 2020-06-05 DIAGNOSIS — G8929 Other chronic pain: Secondary | ICD-10-CM

## 2020-06-05 DIAGNOSIS — E66813 Obesity, class 3: Secondary | ICD-10-CM

## 2020-06-05 MED ORDER — OXYCODONE HCL 5 MG PO TABS: 5.0000 mg | ORAL_TABLET | Freq: Every day | ORAL | 0 refills | Status: DC

## 2020-06-05 MED ORDER — PREGABALIN 225 MG PO CAPS: 225.0000 mg | ORAL_CAPSULE | Freq: Two times a day (BID) | ORAL | 5 refills | Status: DC

## 2020-06-06 NOTE — Progress Notes (Signed)
Virtual Visit via Telephone Note  I connected with Karen Lewis on 06/06/20 at 11:30 AM EDT by telephone and verified that I am speaking with the correct person using two identifiers.  Location: Patient: Home Provider: Pain control center   I discussed the limitations, risks, security and privacy concerns of performing an evaluation and management service by telephone and the availability of in person appointments. I also discussed with the patient that there may be a patient responsible charge related to this service. The patient expressed understanding and agreed to proceed.   History of Present Illness: I spoke with Karen Lewis today via telephone as we were unable to link up with the video portion of the virtual conference but she reports that her pain has been well managed with her current opioid regimen. She denies any side effects with her medications and continues to derive good functional benefit with the medications. She reports that she has failed more conservative therapy and this current regimen seems to be working well for her. The quality characteristic and distribution of her pain remain unchanged. She primarily has aching gnawing pain in the low back without any significant change in quality reported. Her strength of the lower extremities has been at baseline. Otherwise she reports to be in her usual state of health.  Observations/Objective:   Current Outpatient Medications:  .  ACCU-CHEK AVIVA PLUS test strip, , Disp: , Rfl:  .  acetaminophen (TYLENOL) 650 MG CR tablet, Take 650-1,300 mg by mouth every 8 (eight) hours as needed for pain., Disp: , Rfl:  .  acidophilus (RISAQUAD) CAPS capsule, Take 1 capsule by mouth daily., Disp: , Rfl:  .  albuterol (PROVENTIL HFA;VENTOLIN HFA) 108 (90 BASE) MCG/ACT inhaler, Inhale 2 puffs into the lungs every 6 (six) hours as needed for wheezing or shortness of breath., Disp: , Rfl:  .  amitriptyline (ELAVIL) 100 MG tablet, Take 100 mg by mouth  at bedtime., Disp: , Rfl:  .  Ascorbic Acid (VITAMIN C) 1000 MG tablet, Take 1,000 mg by mouth daily. , Disp: , Rfl:  .  Biotin 10 MG CAPS, Take 10 mg by mouth daily. , Disp: , Rfl:  .  butalbital-acetaminophen-caffeine (FIORICET) 50-325-40 MG tablet, Take 1 tablet by mouth every 6 (six) hours as needed for migraine.  (Patient not taking: Reported on 04/21/2020), Disp: , Rfl:  .  calcium carbonate (TUMS EX) 750 MG chewable tablet, Chew 1 tablet by mouth daily. , Disp: , Rfl:  .  clonazePAM (KLONOPIN) 0.5 MG tablet, Take 1 tablet (0.5 mg total) by mouth 3 (three) times daily as needed for anxiety., Disp: 30 tablet, Rfl: 0 .  Cyanocobalamin (B-12) 2500 MCG TABS, Take 2,500 mcg by mouth daily., Disp: , Rfl:  .  diclofenac sodium (VOLTAREN) 1 % GEL, Apply 2 g topically 4 (four) times daily. (Patient taking differently: Apply 2 g topically 3 (three) times daily as needed (pain). ), Disp: 100 g, Rfl: 1 .  diphenhydrAMINE (BENADRYL) 25 MG tablet, Take 25 mg by mouth every 8 (eight) hours as needed for itching or allergies. , Disp: , Rfl:  .  empagliflozin (JARDIANCE) 25 MG TABS tablet, Take 25 mg by mouth daily., Disp: , Rfl:  .  EPINEPHrine 0.3 mg/0.3 mL IJ SOAJ injection, Inject 0.3 mg into the muscle as needed for anaphylaxis.  (Patient not taking: Reported on 04/21/2020), Disp: , Rfl:  .  ergocalciferol (VITAMIN D2) 1.25 MG (50000 UT) capsule, Take 50,000 Units by mouth 2 (two) times  a week. , Disp: , Rfl:  .  famotidine (PEPCID) 20 MG tablet, Take 20 mg by mouth at bedtime. , Disp: , Rfl:  .  glucose blood (ACCU-CHEK AVIVA PLUS) test strip, Use 2 (two) times daily, Disp: , Rfl:  .  HYDROcodone-acetaminophen (NORCO) 7.5-325 MG tablet, Take 1 tablet by mouth every 6 (six) hours as needed for moderate pain., Disp: 30 tablet, Rfl: 0 .  levocetirizine (XYZAL) 5 MG tablet, Take 5 mg by mouth at bedtime. , Disp: , Rfl:  .  levothyroxine (SYNTHROID) 125 MCG tablet, Take 250 mcg by mouth daily before breakfast. ,  Disp: , Rfl:  .  lisinopril (ZESTRIL) 5 MG tablet, Take 5 mg by mouth at bedtime. , Disp: , Rfl:  .  magnesium oxide (MAG-OX) 400 MG tablet, Take 800-1,200 mg by mouth See admin instructions. Take 1200 mg by mouth in the morning and 800 mg at bedtime, Disp: , Rfl:  .  meclizine (ANTIVERT) 25 MG tablet, Take 25 mg by mouth 3 (three) times daily as needed for dizziness. , Disp: , Rfl:  .  medroxyPROGESTERone (DEPO-PROVERA) 150 MG/ML injection, Inject 150 mg into the muscle every 3 (three) months. , Disp: , Rfl:  .  metFORMIN (GLUCOPHAGE) 1000 MG tablet, Take 1,000 mg by mouth 2 (two) times daily with a meal. , Disp: , Rfl:  .  metoprolol tartrate (LOPRESSOR) 50 MG tablet, Take 75 mg by mouth 2 (two) times daily. , Disp: , Rfl:  .  montelukast (SINGULAIR) 10 MG tablet, Take 10 mg by mouth at bedtime. , Disp: , Rfl:  .  Multiple Vitamin (MULTI-VITAMIN) tablet, Take 2 tablets by mouth daily., Disp: , Rfl:  .  naloxone (NARCAN) nasal spray 4 mg/0.1 mL, Spray into one nostril. Repeat with second device into other nostril after 2-3 minutes if no or minimal response. (Patient not taking: Reported on 04/21/2020), Disp: 1 kit, Rfl: 0 .  naphazoline-pheniramine (NAPHCON-A) 0.025-0.3 % ophthalmic solution, Place 1 drop into both eyes 4 (four) times daily as needed for eye irritation., Disp: , Rfl:  .  omeprazole (PRILOSEC) 40 MG capsule, Take 40 mg by mouth in the morning and at bedtime., Disp: , Rfl:  .  oxyCODONE (OXY IR/ROXICODONE) 5 MG immediate release tablet, Take 1 tablet (5 mg total) by mouth 5 (five) times daily. Must last 30 days, Disp: 150 tablet, Rfl: 0 .  [START ON 06/09/2020] oxyCODONE (OXY IR/ROXICODONE) 5 MG immediate release tablet, Take 1 tablet (5 mg total) by mouth 5 (five) times daily. Must last 30 days, Disp: 150 tablet, Rfl: 0 .  [START ON 07/09/2020] oxyCODONE (OXY IR/ROXICODONE) 5 MG immediate release tablet, Take 1 tablet (5 mg total) by mouth 5 (five) times daily. Must last 30 days, Disp:  150 tablet, Rfl: 0 .  pregabalin (LYRICA) 225 MG capsule, Take 1 capsule (225 mg total) by mouth 2 (two) times daily., Disp: 60 capsule, Rfl: 5 .  QUEtiapine (SEROQUEL) 100 MG tablet, Take 100 mg by mouth at bedtime., Disp: , Rfl:  .  rosuvastatin (CRESTOR) 40 MG tablet, Take 40 mg by mouth daily. , Disp: , Rfl:  .  sertraline (ZOLOFT) 100 MG tablet, Take 200 mg by mouth daily. , Disp: , Rfl:  .  spironolactone (ALDACTONE) 25 MG tablet, Take 37.5 mg by mouth 2 (two) times daily. , Disp: , Rfl:  .  tiZANidine (ZANAFLEX) 4 MG tablet, Take 1 tablet (4 mg total) by mouth 2 (two) times daily as needed for muscle  spasms., Disp: 30 tablet, Rfl: 0 .  topiramate (TOPAMAX) 50 MG tablet, Take 50 mg by mouth 2 (two) times daily., Disp: , Rfl:  .  triamcinolone (NASACORT AQ) 55 MCG/ACT AERO nasal inhaler, Place 2 sprays into the nose 2 (two) times daily as needed (allergies). , Disp: , Rfl:  .  zolpidem (AMBIEN) 10 MG tablet, Take 10 mg by mouth at bedtime as needed for sleep., Disp: , Rfl:  .  zonisamide (ZONEGRAN) 50 MG capsule, Take 150 mg by mouth at bedtime. , Disp: , Rfl:  Assessment and Plan: 1. Chronic knee pain (Primary Area of Pain) (Bilateral) (R>L)   2. Secondary Osteoarthritis of knee (Bilateral) (R>L)   3. Class 3 severe obesity due to excess calories with serious comorbidity and body mass index (BMI) of 60.0 to 69.9 in adult (HCC)   4. Pain and numbness of left upper extremity   5. Chronic shoulder pain (Left)   6. Cervical radiculitis (Left)   7. Chronic pain syndrome   8. Chronic low back pain (Third area of Pain) (Bilateral) (R>L)   9. Spondylosis without myelopathy or radiculopathy, lumbosacral region   10. Fibromyalgia   11. Neurogenic pain   Based on our discussion today and upon review of the [facility] practitioner database information is appropriate to go ahead and refill her medications. She normally follows with Dr. [name] however we will assist today in his absence. I have  her follow-up with him in 2 months. No other changes are made in her pain management protocol today. She is also encouraged to continue follow-up with her primary care physicians for her baseline medical care  Follow Up Instructions:    I discussed the assessment and treatment plan with the patient. The patient was provided an opportunity to ask questions and all were answered. The patient agreed with the plan and demonstrated an understanding of the instructions.   The patient was advised to call back or seek an in-person evaluation if the symptoms worsen or if the condition fails to improve as anticipated.  I provided. 30 minutes of non-face-to-face time during this encounter.   Molli Barrows, MD

## 2020-06-10 DIAGNOSIS — E038 Other specified hypothyroidism: Secondary | ICD-10-CM | POA: Diagnosis not present

## 2020-06-10 DIAGNOSIS — E612 Magnesium deficiency: Secondary | ICD-10-CM | POA: Diagnosis not present

## 2020-06-10 DIAGNOSIS — R519 Headache, unspecified: Secondary | ICD-10-CM | POA: Diagnosis not present

## 2020-06-10 DIAGNOSIS — E611 Iron deficiency: Secondary | ICD-10-CM | POA: Diagnosis not present

## 2020-06-10 DIAGNOSIS — E519 Thiamine deficiency, unspecified: Secondary | ICD-10-CM | POA: Diagnosis not present

## 2020-06-10 DIAGNOSIS — E538 Deficiency of other specified B group vitamins: Secondary | ICD-10-CM | POA: Diagnosis not present

## 2020-06-10 DIAGNOSIS — E559 Vitamin D deficiency, unspecified: Secondary | ICD-10-CM | POA: Diagnosis not present

## 2020-06-11 ENCOUNTER — Ambulatory Visit: Payer: Medicare HMO | Admitting: Pain Medicine

## 2020-06-14 DIAGNOSIS — M797 Fibromyalgia: Secondary | ICD-10-CM | POA: Diagnosis not present

## 2020-06-14 DIAGNOSIS — G894 Chronic pain syndrome: Secondary | ICD-10-CM | POA: Diagnosis not present

## 2020-06-18 DIAGNOSIS — G43009 Migraine without aura, not intractable, without status migrainosus: Secondary | ICD-10-CM | POA: Diagnosis not present

## 2020-06-23 NOTE — Progress Notes (Signed)
PROVIDER NOTE: Information contained herein reflects review and annotations entered in association with encounter. Interpretation of such information and data should be left to medically-trained personnel. Information provided to patient can be located elsewhere in the medical record under "Patient Instructions". Document created using STT-dictation technology, any transcriptional errors that may result from process are unintentional.    Patient: Karen Lewis  Service Category: Procedure  Provider: Gaspar Cola, MD  DOB: 1971-08-06  DOS: 06/24/2020  Location: [facility]  MRN: [id number]  Setting: Ambulatory - outpatient  Referring Provider: Idelle Crouch, MD  Type: Established Patient  Specialty: Interventional Pain Management  PCP: Idelle Crouch, MD   Primary Reason for Visit: Interventional Pain Management Treatment. CC: Hip Pain  Procedure:          Anesthesia, Analgesia, Anxiolysis:  Type: Therapeutic Intra-Articular Hyalgan Knee Injection #4  Region: Lateral infrapatellar Knee Region Level: Knee Joint Laterality: Bilateral  Type: Moderate (Conscious) Sedation combined with Local Anesthesia Indication(s): Analgesia and Anxiety Local Anesthetic: Lidocaine 1-2% Route: Intravenous (IV) IV Access: Secured Sedation: Meaningful verbal contact was maintained at all times during the procedure   Position: Supine w/ knee bent 20 to 30 degrees   Indications: 1. Secondary Osteoarthritis of knee (Bilateral) (R>L)   2. Chronic knee pain (Primary Area of Pain) (Bilateral) (R>L)   3. Class 3 severe obesity due to excess calories with serious comorbidity and body mass index (BMI) of 60.0 to 69.9 in adult ([place])   4. Chronic pain syndrome   5. Fibromyalgia   6. Neurogenic pain    Pain Score: Pre-procedure: 2 /10 Post-procedure: 0-No pain/10   RTCB: 10/07/2020  Pre-op Assessment:  [name] is a 49 y.o. (year old), female patient, seen today for interventional  treatment. She  has a past surgical history that includes Laparoscopic partial gastrectomy; Shoulder arthroscopy (Right); Carpal tunnel release (Bilateral); Diagnostic laparoscopy; Cholecystectomy; Trigger finger release (Right); Thyroidectomy (N/A, 11/12/2015); left trigger finger; Roux-en-Y Gastric Bypass (06/03/2017); Hiatal hernia repair; peniculectomy (N/A, 07/05/2018); Total hip arthroplasty (Right, 11/27/2019); Joint replacement (Bilateral, hip); Appendectomy; and Trigger finger release (Right, 04/24/2020). Ms. Angelini has a current medication list which includes the following prescription(s): accu-chek aviva plus, acetaminophen, acidophilus, albuterol, amitriptyline, vitamin c, biotin, calcium carbonate, clonazepam, b-12, diclofenac sodium, diphenhydramine, empagliflozin, ergocalciferol, famotidine, accu-chek aviva plus, hydrocodone-acetaminophen, levocetirizine, magnesium oxide, meclizine, medroxyprogesterone, metformin, metoprolol tartrate, montelukast, naphazoline-pheniramine, omeprazole, [START ON 07/09/2020] oxycodone, quetiapine, rosuvastatin, sertraline, spironolactone, tizanidine, topiramate, triamcinolone, zolpidem, zonisamide, butalbital-acetaminophen-caffeine, epinephrine, levothyroxine, lisinopril, naloxone, [START ON 08/08/2020] oxycodone, [START ON 09/07/2020] oxycodone, and [START ON 07/05/2020] pregabalin, and the following Facility-Administered Medications: fentanyl and midazolam. Her primarily concern today is the Hip Pain  Initial Vital Signs:  Pulse/HCG Rate: 83ECG Heart Rate: 80 Temp: (!) 97.2 F (36.2 C) Resp: (!) 29 BP: (!) 136/91 SpO2: 98 %  BMI: Estimated body mass index is 63.17 kg/m as calculated from the following:   Height as of this encounter: 5\' 4"  (1.626 m).   Weight as of this encounter: 368 lb (166.9 kg).  Risk Assessment: Allergies: Reviewed. She is allergic to bactrim [sulfamethoxazole-trimethoprim], omalizumab, ciprofloxacin, shellfish allergy, and nsaids.   Allergy Precautions: None required Coagulopathies: Reviewed. None identified.  Blood-thinner therapy: None at this time Active Infection(s): Reviewed. None identified. [name] is afebrile  Site Confirmation: Ms. Solorzano was asked to confirm the procedure and laterality before marking the site Procedure checklist: Completed Consent: Before the procedure and under the influence of no sedative(s), amnesic(s), or anxiolytics, the patient was informed  of the treatment options, risks and possible complications. To fulfill our ethical and legal obligations, as recommended by the American Medical Association's Code of Ethics, I have informed the patient of my clinical impression; the nature and purpose of the treatment or procedure; the risks, benefits, and possible complications of the intervention; the alternatives, including doing nothing; the risk(s) and benefit(s) of the alternative treatment(s) or procedure(s); and the risk(s) and benefit(s) of doing nothing. The patient was provided information about the general risks and possible complications associated with the procedure. These may include, but are not limited to: failure to achieve desired goals, infection, bleeding, organ or nerve damage, allergic reactions, paralysis, and death. In addition, the patient was informed of those risks and complications associated to the procedure, such as failure to decrease pain; infection; bleeding; organ or nerve damage with subsequent damage to sensory, motor, and/or autonomic systems, resulting in permanent pain, numbness, and/or weakness of one or several areas of the body; allergic reactions; (i.e.: anaphylactic reaction); and/or death. Furthermore, the patient was informed of those risks and complications associated with the medications. These include, but are not limited to: allergic reactions (i.e.: anaphylactic or anaphylactoid reaction(s)); adrenal axis suppression; blood sugar elevation that in diabetics may  result in ketoacidosis or comma; water retention that in patients with history of congestive heart failure may result in shortness of breath, pulmonary edema, and decompensation with resultant heart failure; weight gain; swelling or edema; medication-induced neural toxicity; particulate matter embolism and blood vessel occlusion with resultant organ, and/or nervous system infarction; and/or aseptic necrosis of one or more joints. Finally, the patient was informed that Medicine is not an exact science; therefore, there is also the possibility of unforeseen or unpredictable risks and/or possible complications that may result in a catastrophic outcome. The patient indicated having understood very clearly. We have given the patient no guarantees and we have made no promises. Enough time was given to the patient to ask questions, all of which were answered to the patient's satisfaction. Ms. Knudsen has indicated that she wanted to continue with the procedure. Attestation: I, the ordering provider, attest that I have discussed with the patient the benefits, risks, side-effects, alternatives, likelihood of achieving goals, and potential problems during recovery for the procedure that I have provided informed consent. Date  Time: 06/24/2020 10:57 AM  Pre-Procedure Preparation:  Monitoring: As per clinic protocol. Respiration, ETCO2, SpO2, BP, heart rate and rhythm monitor placed and checked for adequate function Safety Precautions: Patient was assessed for positional comfort and pressure points before starting the procedure. Time-out: I initiated and conducted the "Time-out" before starting the procedure, as per protocol. The patient was asked to participate by confirming the accuracy of the "Time Out" information. Verification of the correct person, site, and procedure were performed and confirmed by me, the nursing staff, and the patient. "Time-out" conducted as per Joint Commission's Universal Protocol  (UP.01.01.01). Time: 1151  Description of Procedure:          Target Area: Knee Joint Approach: Just above the Lateral tibial plateau, lateral to the infrapatellar tendon. Area Prepped: Entire knee area, from the mid-thigh to the mid-shin. DuraPrep (Iodine Povacrylex [0.7% available iodine] and Isopropyl Alcohol, 74% w/w) Safety Precautions: Aspiration looking for blood return was conducted prior to all injections. At no point did we inject any substances, as a needle was being advanced. No attempts were made at seeking any paresthesias. Safe injection practices and needle disposal techniques used. Medications properly checked for expiration dates. SDV (single  dose vial) medications used. Description of the Procedure: Protocol guidelines were followed. The patient was placed in position over the fluoroscopy table. The target area was identified and the area prepped in the usual manner. Skin & deeper tissues infiltrated with local anesthetic. Appropriate amount of time allowed to pass for local anesthetics to take effect. The procedure needles were then advanced to the target area. Proper needle placement secured. Negative aspiration confirmed. Solution injected in intermittent fashion, asking for systemic symptoms every 0.5cc of injectate. The needles were then removed and the area cleansed, making sure to leave some of the prepping solution back to take advantage of its long term bactericidal properties. Vitals:   06/24/20 1155 06/24/20 1159 06/24/20 1209 06/24/20 1219  BP: 113/80 111/76 101/80 97/79  Pulse:   81 80  Resp: (!) 25 16 16 15   Temp:      SpO2: 97% 97% 98% 98%  Weight:      Height:        Start Time: 1151 hrs. End Time: 1157 hrs. Materials:  Needle(s) Type: Spinal needle Gauge: 22G Length: 3.5-in Medication(s): Please see orders for medications and dosing details.  Imaging Guidance:          Type of Imaging Technique: Fluoroscopy Guidance (Non-spinal) Indication(s): Morbid  obesity. Assistance in needle guidance and placement for procedures requiring needle placement in or near specific anatomical locations impossible to access without such assistance. Exposure Time: Please see nurses notes. Contrast: None used. Fluoroscopic Guidance: I was personally present during the use of fluoroscopy. "Tunnel Vision Technique" used to obtain the best possible view of the target area. Parallax error corrected before commencing the procedure. "Direction-depth-direction" technique used to introduce the needle under continuous pulsed fluoroscopy. Once target was reached, antero-posterior, oblique, and lateral fluoroscopic projection used confirm needle placement in all planes. Images permanently stored in EMR. Ultrasound Guidance: N/A Interpretation: No contrast injected. I personally interpreted the imaging intraoperatively. Adequate needle placement confirmed in multiple planes. Permanent images saved into the patient's record.  Antibiotic Prophylaxis:   Anti-infectives (From admission, onward)   None     Indication(s): None identified  Post-operative Assessment:  Post-procedure Vital Signs:  Pulse/HCG Rate: 8082 Temp: (!) 97.2 F (36.2 C) Resp: 15 BP: 97/79 SpO2: 98 %  EBL: None  Complications: No immediate post-treatment complications observed by team, or reported by patient.  Note: The patient tolerated the entire procedure well. A repeat set of vitals were taken after the procedure and the patient was kept under observation following institutional policy, for this type of procedure. Post-procedural neurological assessment was performed, showing return to baseline, prior to discharge. The patient was provided with post-procedure discharge instructions, including a section on how to identify potential problems. Should any problems arise concerning this procedure, the patient was given instructions to immediately contact us, at any time, without hesitation. In any case,  we plan to contact the patient by telephone for a follow-up status report regarding this interventional procedure.  Comments:  No additional relevant information.  Plan of Care  Orders:  Orders Placed This Encounter  Procedures  . KNEE INJECTION    Hyalgan knee injection to be done by MD.    Scheduling Instructions:     Procedure: Intra-articular Hyalgan Knee injection #4     Side(s): Bilateral Knee     Sedation: With Sedation.     Timeframe: Today    Order Specific Question:   Where will this procedure be performed?    Answer:   [facility]  . KNEE INJECTION    Hyalgan knee injection. Please order Hyalgan.    Standing Status:   Future    Standing Expiration Date:   07/25/2020    Scheduling Instructions:     Procedure: Intra-articular Hyalgan Knee injection #5     Side: Bilateral     Sedation: With Sedation.     Timeframe: in two (2) weeks    Order Specific Question:   Where will this procedure be performed?    Answer:   [facility]  . DG PAIN [facility] C-ARM 1-60 MIN NO REPORT    Intraoperative interpretation by procedural physician at [place].    Standing Status:   Standing    Number of Occurrences:   1    Order Specific Question:   Reason for exam:    Answer:   Assistance in needle guidance and placement for procedures requiring needle placement in or near specific anatomical locations not easily accessible without such assistance.  . Informed Consent Details: Physician/Practitioner Attestation; Transcribe to consent form and obtain patient signature    Provider Attestation: I, [place] [name], MD, (Pain Management Specialist), the physician/practitioner, attest that I have discussed with the patient the benefits, risks, side effects, alternatives, likelihood of achieving goals and potential problems during recovery for the procedure that I have provided informed consent.    Scheduling Instructions:     Note: Always confirm laterality of pain  with Ms. Brigitte Pulse, before procedure.     Transcribe to consent form and obtain patient signature.    Order Specific Question:   Physician/Practitioner attestation of informed consent for procedure/surgical case    Answer:   I, the physician/practitioner, attest that I have discussed with the patient the benefits, risks, side effects, alternatives, likelihood of achieving goals and potential problems during recovery for the procedure that I have provided informed consent.    Order Specific Question:   Procedure    Answer:   Therapeutic, bilateral, intra-articular Hyalgan knee injection    Order Specific Question:   Physician/Practitioner performing the procedure    Answer:   Destine Zirkle A. [name], MD    Order Specific Question:   Indication/Reason    Answer:   Chronic bilateral knee pain secondary to primary osteoarthritis of the knee  . Provide equipment / supplies at bedside    "Block Tray" (Disposable  single use) Needle type: Spinal Amount/quantity: 2 Size: Regular (3-3.5-inch) Gauge: 22G    Standing Status:   Standing    Number of Occurrences:   1    Order Specific Question:   Specify    Answer:   Block Tray  . Miscellanous precautions    NOTE: Although It is true that patients can have allergies to shellfish and that shellfish contain iodine, most shellfish  allergies are due to two protein allergens present in the shellfish: tropomyosins and parvalbumin. Not all patients with shellfish allergies are allergic to iodine. However, as a precaution, avoid using iodine containing products.    Standing Status:   Standing    Number of Occurrences:   1   Chronic Opioid Analgesic:  Oxycodone IR 5 mg 1 tab PO 5X/day (#150/mo) (25 mg/day) MME/day:37.5 mg/day.   Medications ordered for procedure: Meds ordered this encounter  Medications  . oxyCODONE (OXY IR/ROXICODONE) 5 MG immediate release tablet    Sig: Take 1 tablet (5 mg total) by mouth 5 (five) times daily. Must last 30 days    Dispense:   150 tablet  Refill:  0    Chronic Pain: STOP Act (Not applicable) Fill 1 day early if closed on refill date. Avoid benzodiazepines within 8 hours of opioids  . oxyCODONE (OXY IR/ROXICODONE) 5 MG immediate release tablet    Sig: Take 1 tablet (5 mg total) by mouth 5 (five) times daily. Must last 30 days    Dispense:  150 tablet    Refill:  0    Chronic Pain: STOP Act (Not applicable) Fill 1 day early if closed on refill date. Avoid benzodiazepines within 8 hours of opioids  . pregabalin (LYRICA) 225 MG capsule    Sig: Take 1 capsule (225 mg total) by mouth 2 (two) times daily.    Dispense:  180 capsule    Refill:  0    Fill one day early if pharmacy is closed on scheduled refill date. May substitute for generic if available.  . lidocaine (XYLOCAINE) 2 % (with pres) injection 400 mg  . lactated ringers infusion 1,000 mL  . midazolam (VERSED) 5 MG/5ML injection 1-2 mg    Make sure Flumazenil is available in the pyxis when using this medication. If oversedation occurs, administer 0.2 mg IV over 15 sec. If after 45 sec no response, administer 0.2 mg again over 1 min; may repeat at 1 min intervals; not to exceed 4 doses (1 mg)  . fentaNYL (SUBLIMAZE) injection 25-50 mcg    Make sure Narcan is available in the pyxis when using this medication. In the event of respiratory depression (RR< 8/min): Titrate NARCAN (naloxone) in increments of 0.1 to 0.2 mg IV at 2-3 minute intervals, until desired degree of reversal.  . lidocaine (PF) (XYLOCAINE) 1 % injection 5 mL  . ropivacaine (PF) 2 mg/mL (0.2%) (NAROPIN) injection 5 mL  . Sodium Hyaluronate SOSY 2 mL  . Sodium Hyaluronate SOSY 2 mL   Medications administered: We administered lidocaine, lactated ringers, midazolam, fentaNYL, lidocaine (PF), ropivacaine (PF) 2 mg/mL (0.2%), Sodium Hyaluronate, and Sodium Hyaluronate.  See the medical record for exact dosing, route, and time of administration.  Follow-up plan:   Return in about 2 weeks (around  07/08/2020) for Procedure (w/ sedation): (B) Hyalgan #5.       Considering:  NOTE: NO RFA until BMI less or equal to 35.    Palliative PRN treatment(s):  Palliative bilateral IA Hyalgan knee injections  Palliative bilateral genicular NB  Palliative bilateral lumbar facet blocks      Recent Visits Date Type Provider Dept  06/05/20 Telemedicine Molli Barrows, MD [facility]  04/17/20 Procedure visit Milinda Pointer, MD [facility]  Showing recent visits within past 90 days and meeting all other requirements Today's Visits Date Type Provider Dept  06/24/20 Procedure visit Milinda Pointer, MD [facility]  Showing today's visits and meeting all other requirements Future Appointments No visits were found meeting these conditions. Showing future appointments within next 90 days and meeting all other requirements  Disposition: Discharge home  Discharge (Date  Time): 06/24/2020; 1224 hrs.   Primary Care Physician: Idelle Crouch, MD Location: [facility] Note by: Gaspar Cola, MD Date: 06/24/2020; Time: 12:31 PM  Disclaimer:  Medicine is not an exact science. The only guarantee in medicine is that nothing is guaranteed. It is important to note that the decision to proceed with this intervention was based on the information collected from the patient. The Data and conclusions were drawn from the patient's questionnaire, the interview, and the physical examination. Because  the information was provided in large part by the patient, it cannot be guaranteed that it has not been purposely or unconsciously manipulated. Every effort has been made to obtain as much relevant data as possible for this evaluation. It is important to note that the conclusions that lead to this procedure are derived in large part from the available data. Always take into account that the treatment will also be dependent on availability of  resources and existing treatment guidelines, considered by other Pain Management Practitioners as being common knowledge and practice, at the time of the intervention. For Medico-Legal purposes, it is also important to point out that variation in procedural techniques and pharmacological choices are the acceptable norm. The indications, contraindications, technique, and results of the above procedure should only be interpreted and judged by a Board-Certified Interventional Pain Specialist with extensive familiarity and expertise in the same exact procedure and technique.

## 2020-06-24 ENCOUNTER — Ambulatory Visit (HOSPITAL_BASED_OUTPATIENT_CLINIC_OR_DEPARTMENT_OTHER): Payer: Medicare HMO | Admitting: Pain Medicine

## 2020-06-24 ENCOUNTER — Other Ambulatory Visit: Payer: Self-pay

## 2020-06-24 ENCOUNTER — Encounter: Payer: Self-pay | Admitting: Pain Medicine

## 2020-06-24 ENCOUNTER — Ambulatory Visit
Admission: RE | Admit: 2020-06-24 | Discharge: 2020-06-24 | Disposition: A | Discharge status: Home / Self Care | Payer: Medicare HMO | Source: Ambulatory Visit | Attending: Pain Medicine | Admitting: Pain Medicine

## 2020-06-24 VITALS — BP 97/79 | HR 80 | Temp 97.2°F | Resp 15 | Ht 64.0 in | Wt 368.0 lb

## 2020-06-24 DIAGNOSIS — M792 Neuralgia and neuritis, unspecified: Secondary | ICD-10-CM | POA: Insufficient documentation

## 2020-06-24 DIAGNOSIS — M797 Fibromyalgia: Secondary | ICD-10-CM | POA: Insufficient documentation

## 2020-06-24 DIAGNOSIS — M174 Other bilateral secondary osteoarthritis of knee: Secondary | ICD-10-CM | POA: Insufficient documentation

## 2020-06-24 DIAGNOSIS — G8929 Other chronic pain: Secondary | ICD-10-CM | POA: Diagnosis not present

## 2020-06-24 DIAGNOSIS — M25561 Pain in right knee: Secondary | ICD-10-CM | POA: Diagnosis not present

## 2020-06-24 DIAGNOSIS — G894 Chronic pain syndrome: Secondary | ICD-10-CM

## 2020-06-24 DIAGNOSIS — Z6841 Body Mass Index (BMI) 40.0 and over, adult: Secondary | ICD-10-CM | POA: Diagnosis not present

## 2020-06-24 DIAGNOSIS — E66813 Obesity, class 3: Secondary | ICD-10-CM

## 2020-06-24 DIAGNOSIS — M25562 Pain in left knee: Secondary | ICD-10-CM | POA: Insufficient documentation

## 2020-06-24 MED ORDER — LIDOCAINE HCL 2 % IJ SOLN
20.0000 mL | Freq: Once | INTRAMUSCULAR | Status: AC
  Administered 2020-06-24: 200 mg

## 2020-06-24 MED ORDER — FENTANYL CITRATE (PF) 100 MCG/2ML IJ SOLN
25.0000 ug | INTRAMUSCULAR | Status: DC | PRN
  Administered 2020-06-24: 50 ug via INTRAVENOUS
  Filled 2020-06-24: qty 2

## 2020-06-24 MED ORDER — OXYCODONE HCL 5 MG PO TABS: 5.0000 mg | ORAL_TABLET | Freq: Every day | ORAL | 0 refills | Status: DC

## 2020-06-24 MED ORDER — SODIUM HYALURONATE (VISCOSUP) 20 MG/2ML IX SOSY
2.0000 mL | PREFILLED_SYRINGE | Freq: Once | INTRA_ARTICULAR | Status: AC
  Administered 2020-06-24: 2 mL via INTRA_ARTICULAR

## 2020-06-24 MED ORDER — PREGABALIN 225 MG PO CAPS: 225.0000 mg | ORAL_CAPSULE | Freq: Two times a day (BID) | ORAL | 0 refills | Status: DC

## 2020-06-24 MED ORDER — LIDOCAINE HCL (PF) 1 % IJ SOLN
INTRAMUSCULAR | Status: AC
  Filled 2020-06-24: qty 5

## 2020-06-24 MED ORDER — ROPIVACAINE HCL 2 MG/ML IJ SOLN
5.0000 mL | Freq: Once | INTRAMUSCULAR | Status: AC
  Administered 2020-06-24: 5 mL via INTRA_ARTICULAR
  Filled 2020-06-24: qty 10

## 2020-06-24 MED ORDER — MIDAZOLAM HCL 5 MG/5ML IJ SOLN
1.0000 mg | INTRAMUSCULAR | Status: DC | PRN
  Administered 2020-06-24: 1 mg via INTRAVENOUS
  Filled 2020-06-24: qty 5

## 2020-06-24 MED ORDER — LACTATED RINGERS IV SOLN
1000.0000 mL | Freq: Once | INTRAVENOUS | Status: AC
  Administered 2020-06-24: 1000 mL via INTRAVENOUS

## 2020-06-24 MED ORDER — LIDOCAINE HCL (PF) 1 % IJ SOLN
5.0000 mL | Freq: Once | INTRAMUSCULAR | Status: AC
  Administered 2020-06-24: 5 mL
  Filled 2020-06-24: qty 5

## 2020-06-24 NOTE — Progress Notes (Signed)
Safety precautions to be maintained throughout the outpatient stay will include: orient to surroundings, keep bed in low position, maintain call bell within reach at all times, provide assistance with transfer out of bed and ambulation.  

## 2020-06-24 NOTE — Patient Instructions (Signed)
____________________________________________________________________________________________  Post-Procedure Discharge Instructions  Instructions:  Apply ice:   Purpose: This will minimize any swelling and discomfort after procedure.   When: Day of procedure, as soon as you get home.  How: Fill a plastic sandwich bag with crushed ice. Cover it with a small towel and apply to injection site.  How long: (15 min on, 15 min off) Apply for 15 minutes then remove x 15 minutes.  Repeat sequence on day of procedure, until you go to bed.  Apply heat:   Purpose: To treat any soreness and discomfort from the procedure.  When: Starting the next day after the procedure.  How: Apply heat to procedure site starting the day following the procedure.  How long: May continue to repeat daily, until discomfort goes away.  Food intake: Start with clear liquids (like water) and advance to regular food, as tolerated.   Physical activities: Keep activities to a minimum for the first 8 hours after the procedure. After that, then as tolerated.  Driving: If you have received any sedation, be responsible and do not drive. You are not allowed to drive for 24 hours after having sedation.  Blood thinner: (Applies only to those taking blood thinners) You may restart your blood thinner 6 hours after your procedure.  Insulin: (Applies only to Diabetic patients taking insulin) As soon as you can eat, you may resume your normal dosing schedule.  Infection prevention: Keep procedure site clean and dry. Shower daily and clean area with soap and water.  Post-procedure Pain Diary: Extremely important that this be done correctly and accurately. Recorded information will be used to determine the next step in treatment. For the purpose of accuracy, follow these rules:  Evaluate only the area treated. Do not report or include pain from an untreated area. For the purpose of this evaluation, ignore all other areas of pain,  except for the treated area.  After your procedure, avoid taking a long nap and attempting to complete the pain diary after you wake up. Instead, set your alarm clock to go off every hour, on the hour, for the initial 8 hours after the procedure. Document the duration of the numbing medicine, and the relief you are getting from it.  Do not go to sleep and attempt to complete it later. It will not be accurate. If you received sedation, it is likely that you were given a medication that may cause amnesia. Because of this, completing the diary at a later time may cause the information to be inaccurate. This information is needed to plan your care.  Follow-up appointment: Keep your post-procedure follow-up evaluation appointment after the procedure (usually 2 weeks for most procedures, 6 weeks for radiofrequencies). DO NOT FORGET to bring you pain diary with you.   Expect: (What should I expect to see with my procedure?)  From numbing medicine (AKA: Local Anesthetics): Numbness or decrease in pain. You may also experience some weakness, which if present, could last for the duration of the local anesthetic.  Onset: Full effect within 15 minutes of injected.  Duration: It will depend on the type of local anesthetic used. On the average, 1 to 8 hours.   From steroids (Applies only if steroids were used): Decrease in swelling or inflammation. Once inflammation is improved, relief of the pain will follow.  Onset of benefits: Depends on the amount of swelling present. The more swelling, the longer it will take for the benefits to be seen. In some cases, up to 10 days.  Duration: Steroids will stay in the system x 2 weeks. Duration of benefits will depend on multiple posibilities including persistent irritating factors.  Side-effects: If present, they may typically last 2 weeks (the duration of the steroids).  Frequent: Cramps (if they occur, drink Gatorade and take over-the-counter Magnesium 450-500 mg  once to twice a day); water retention with temporary weight gain; increases in blood sugar; decreased immune system response; increased appetite.  Occasional: Facial flushing (red, warm cheeks); mood swings; menstrual changes.  Uncommon: Long-term decrease or suppression of natural hormones; bone thinning. (These are more common with higher doses or more frequent use. This is why we prefer that our patients avoid having any injection therapies in other practices.)   Very Rare: Severe mood changes; psychosis; aseptic necrosis.  From procedure: Some discomfort is to be expected once the numbing medicine wears off. This should be minimal if ice and heat are applied as instructed.  Call if: (When should I call?)  You experience numbness and weakness that gets worse with time, as opposed to wearing off.  New onset bowel or bladder incontinence. (Applies only to procedures done in the spine)  Emergency Numbers:  Durning business hours (Monday - Thursday, 8:00 AM - 4:00 PM) (Friday, 9:00 AM - 12:00 Noon): [phone]  After hours: [phone]  NOTE: If you are having a problem and are unable connect with, or to talk to a provider, then go to your nearest urgent care or emergency department. If the problem is serious and urgent, please call 911. ____________________________________________________________________________________________    ______________________________________________________________________________________________  Weight Management Required  URGENT: Your weight has been found to be adversely affecting your health.  Dear Karen Lewis:  Your current Estimated body mass index is 62.65 kg/m as calculated from the following:   Height as of 04/18/20: $RemoveBe'5\' 4"'XjRgqYqPB$  (1.626 m).   Weight as of 04/18/20: 365 lb (165.6 kg).  Please use the table below to identify your weight category and associated incidence of chronic pain, secondary to your weight.  Body Mass Index (BMI)  Classification BMI level (kg/m2) Category Associated incidence of chronic pain  <18  Underweight   18.5-24.9 Ideal body weight   25-29.9 Overweight  20%  30-34.9 Obese (Class I)  68%  35-39.9 Severe obesity (Class II)  136%  >40 Extreme obesity (Class III)  254%   In addition: You will be considered "Morbidly Obese", if your BMI is above 30 and you have one or more of the following conditions which are known to be caused and/or directly associated with obesity: 1.    Type 2 Diabetes (Which in turn can lead to cardiovascular diseases (CVD), stroke, peripheral vascular diseases (PVD), retinopathy, nephropathy, and neuropathy) 2.    Cardiovascular Disease (High Blood Pressure; Congestive Heart Failure; High Cholesterol; Coronary Artery Disease; Angina; or History of Heart Attacks) 3.    Breathing problems (Asthma; obesity-hypoventilation syndrome; obstructive sleep apnea; chronic inflammatory airway disease; reactive airway disease; or shortness of breath) 4.    Chronic kidney disease 5.    Liver disease (nonalcoholic fatty liver disease) 6.    High blood pressure 7.    Acid reflux (gastroesophageal reflux disease; heartburn) 8.    Osteoarthritis (OA) (with any of the following: hip pain; knee pain; and/or low back pain) 9.    Low back pain (Lumbar Facet Syndrome; and/or Degenerative Disc Disease) 10.  Hip pain (Osteoarthritis of hip) (For every 1 lbs of added body weight, there is a 2 lbs increase in pressure inside  of each hip articulation. 1:2 mechanical relationship) 11.  Knee pain (Osteoarthritis of knee) (For every 1 lbs of added body weight, there is a 4 lbs increase in pressure inside of each knee articulation. 1:4 mechanical relationship) (patients with a BMI>30 kg/m2 were 6.8 times more likely to develop knee OA than normal-weight individuals) 12.  Cancer: Epidemiological studies have shown that obesity is a risk factor for: post-menopausal breast cancer; cancers of the endometrium, colon  and kidney cancer; malignant adenomas of the oesophagus. Obese subjects have an approximately 1.5-3.5-fold increased risk of developing these cancers compared with normal-weight subjects, and it has been estimated that between 15 and 45% of these cancers can be attributed to overweight. More recent studies suggest that obesity may also increase the risk of other types of cancer, including pancreatic, hepatic and gallbladder cancer. (Ref: Obesity and cancer. Pischon T, Nthlings U, [employer] H. Proc Nutr Soc. 2008 May;67(2):128-45. doi: 31.5400/Q6761950932671245.) The International Agency for Research on Cancer (IARC) has identified 13 cancers associated with overweight and obesity: meningioma, multiple myeloma, adenocarcinoma of the esophagus, and cancers of the thyroid, postmenopausal breast cancer, gallbladder, stomach, liver, pancreas, kidney, ovaries, uterus, colon and rectal (colorectal) cancers. 62 percent of all cancers diagnosed in women and 24 percent of those diagnosed in men are associated with overweight and obesity.  Recommendation: At this point it is urgent that you take a step back and concentrate in loosing weight. Dedicate 100% of your efforts on this task. Nothing else will improve your health more than bringing your weight down and your BMI to less than 30. If you are here, you probably have chronic pain. Because most chronic pain patients have difficulty exercising secondary to their pain, you must rely on proper nutrition and diet in order to lose the weight. If your BMI is above 40, you should seriously consider bariatric surgery. A realistic goal is to lose 10% of your body weight over a period of 12 months.  Be honest to yourself, if over time you have unsuccessfully tried to lose weight, then it is time for you to seek professional help and to enter a medically supervised weight management program, and/or undergo bariatric surgery. Stop procrastinating.   Pain management considerations:   1.    Pharmacological Problems: Be advised that the use of opioid analgesics (oxycodone; hydrocodone; morphine; methadone; codeine; and all of their derivatives) have been associated with decreased metabolism and weight gain.  For this reason, should we see that you are unable to lose weight while taking these medications, it may become necessary for [place] to taper down and indefinitely discontinue them.  2.    Technical Problems: The incidence of successful interventional therapies decreases as the patient's BMI increases. It is much more difficult to accomplish a safe and effective interventional therapy on a patient with a BMI above 35. 3.    Radiation Exposure Problems: The x-rays machine, used to accomplish injection therapies, will automatically increase their x-ray output in order to capture an appropriate bone image. This means that radiation exposure increases exponentially with the patient's BMI. (The higher the BMI, the higher the radiation exposure.) Although the level of radiation used at a given time is still safe to the patient, it is not for the physician and/or assisting staff. Unfortunately, radiation exposure is accumulative. Because physicians and the staff have to do procedures and be exposed on a daily basis, this can result in health problems such as cancer and radiation burns. Radiation exposure to the staff is monitored  by the radiation batches that they wear. The exposure levels are reported back to the staff on a quarterly basis. Depending on levels of exposure, physicians and staff may be obligated by law to decrease this exposure. This means that they have the right and obligation to refuse providing therapies where they may be overexposed to radiation. For this reason, physicians may decline to offer therapies such as radiofrequency ablation or implants to patients with a BMI above 40. 4.    Current Trends: Be advised that the current trend is to no longer offer certain therapies to  patients with a BMI equal to, or above 35, due to increase perioperative risks, increased technical procedural difficulties, and excessive radiation exposure to healthcare personnel.  ______________________________________________________________________________________________

## 2020-06-24 NOTE — Patient Outreach (Signed)
[facility]) Care Management  06/24/2020  AILYNN GOW [birth year] [id number]   Telephone call to patient for disease management support. Patient cannot talk right now.    Plan: RN CM will reschedule patient for November.  Jone Baseman, RN, MSN [place] [facility] Care Management Coordinator Direct Line [phone] Cell [phone] Toll Free: [phone]  Fax: [phone]

## 2020-06-25 ENCOUNTER — Telehealth: Payer: Self-pay | Admitting: *Deleted

## 2020-06-25 NOTE — Telephone Encounter (Signed)
No problems post procedure. 

## 2020-06-27 DIAGNOSIS — R399 Unspecified symptoms and signs involving the genitourinary system: Secondary | ICD-10-CM | POA: Diagnosis not present

## 2020-07-01 ENCOUNTER — Ambulatory Visit: Payer: Self-pay

## 2020-07-04 IMAGING — CR DG CHEST 2V
1 series · 2 of 2 positions shown · non-contrast
Comparison: 02/03/2018 and prior radiographs.

CLINICAL DATA: Fall.

EXAM:
CHEST - 2 VIEW

[Series 1: dg chest 2 view · 0.14mm/px · 2 of 2 slices shown]
[im 1/2]
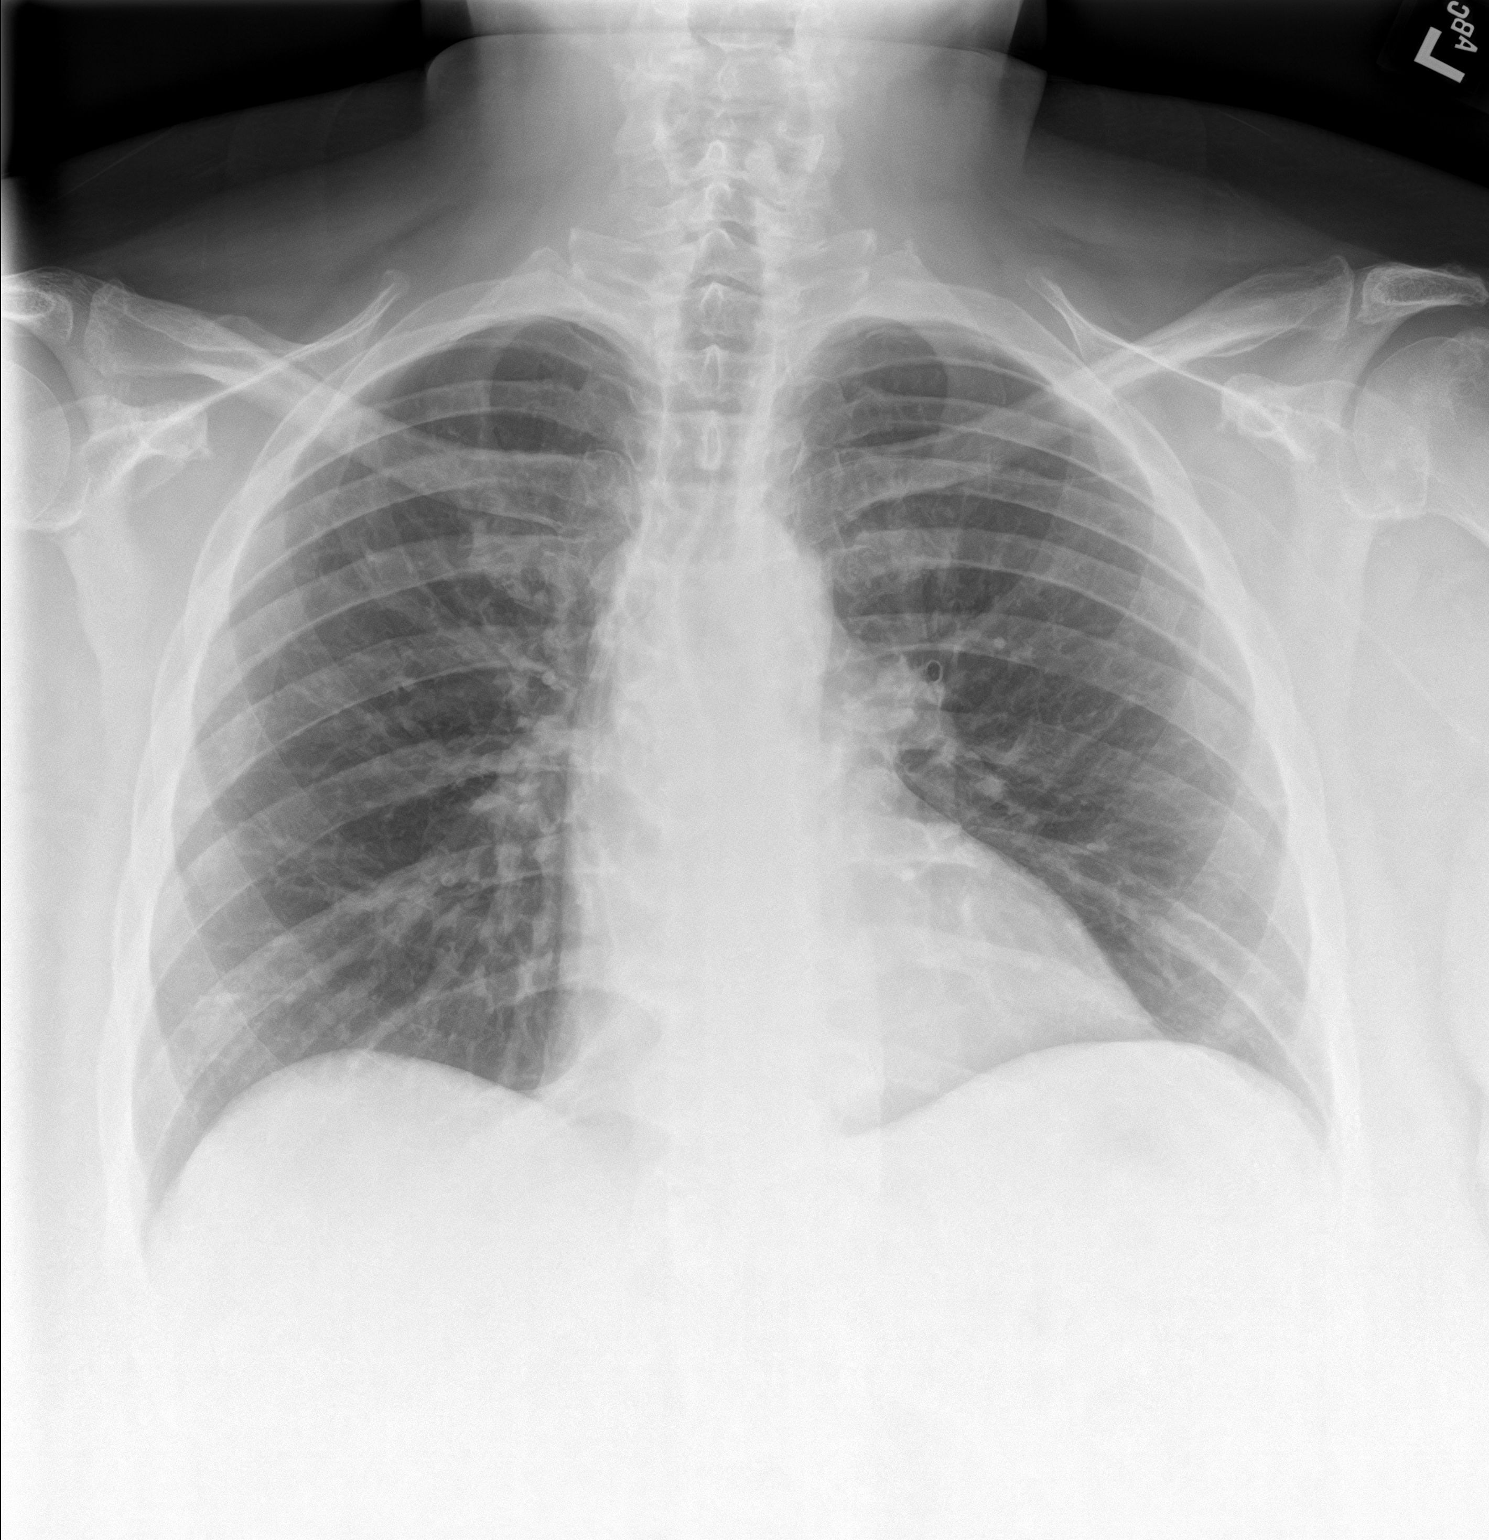
[im 2/2]
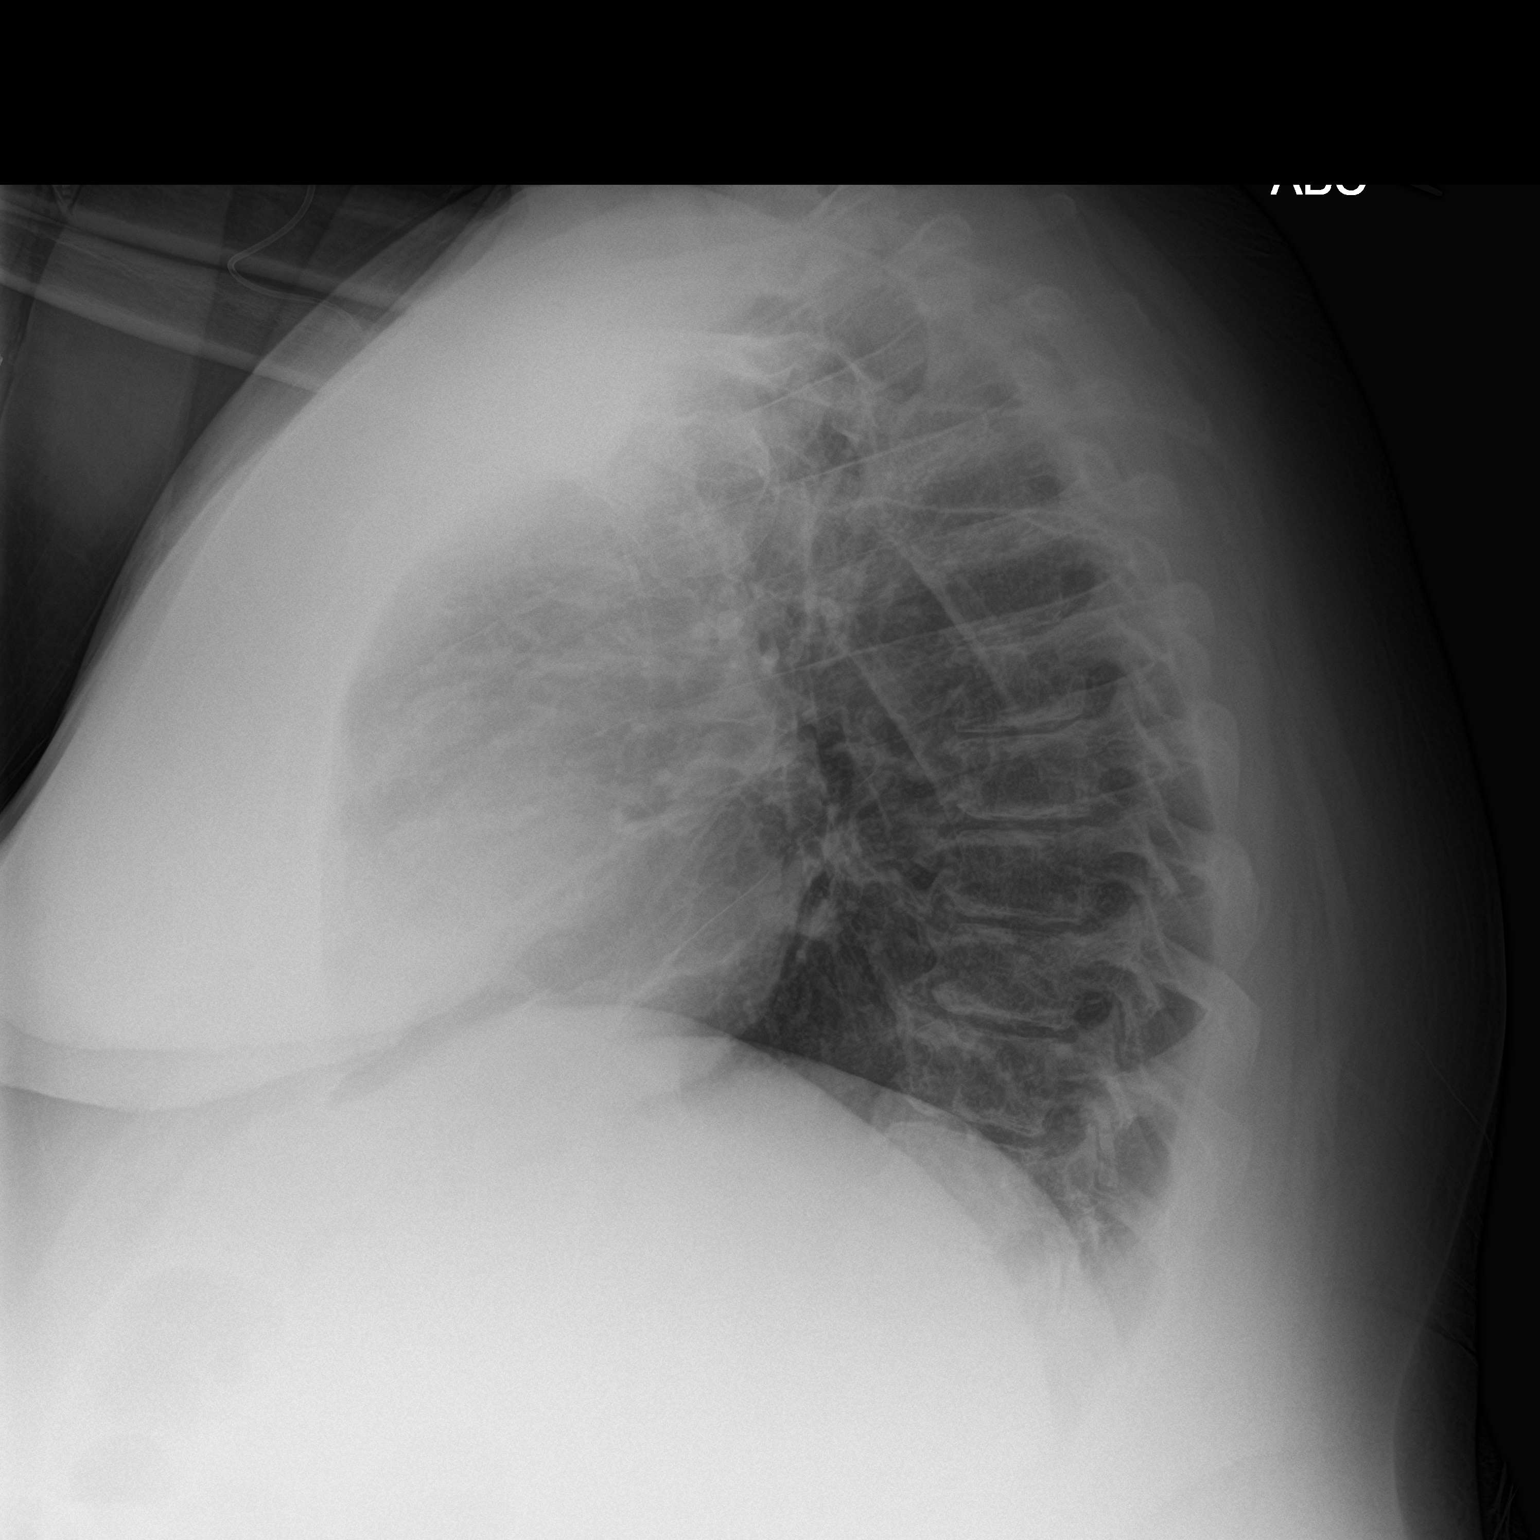

[2 of 2 positions shown; findings below may reference images not displayed]

FINDINGS: The cardiomediastinal silhouette is unremarkable.

A LEFT PICC line is noted with tip difficult to visualize but
appears to overlie the UPPER RIGHT atrium.

There is no evidence of focal airspace disease, pulmonary edema,
suspicious pulmonary nodule/mass, pleural effusion, or pneumothorax.

No acute bony abnormalities are identified.
IMPRESSION: No active cardiopulmonary disease.

## 2020-07-04 IMAGING — CT CT ABD-PELV W/ CM
2 of 5 series · 16 of 46 positions shown, 18 images · IV contrast (omnipaque)
Comparison: 01/10/2019 CT

CLINICAL DATA: Abdominal pain and distension

EXAM:
CT ABDOMEN AND PELVIS WITH CONTRAST
TECHNIQUE: Multidetector CT imaging of the abdomen and pelvis was performed
using the standard protocol following bolus administration of
intravenous contrast.
CONTRAST:  125mL OMNIPAQUE IOHEXOL 300 MG/ML  SOLN

[Series 2: routine abd/pel with (person_name) · axial · 0.98mm/px · z∈[-530,-70]mm · 13 of 104 slices shown, 15 images]
[im 6/104  soft-tissue]
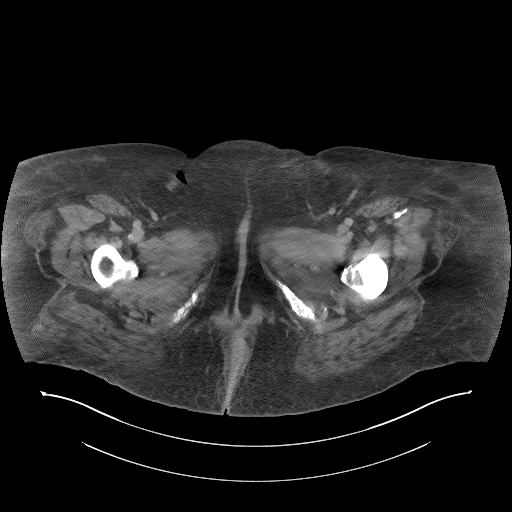
[im 6/104  bone]
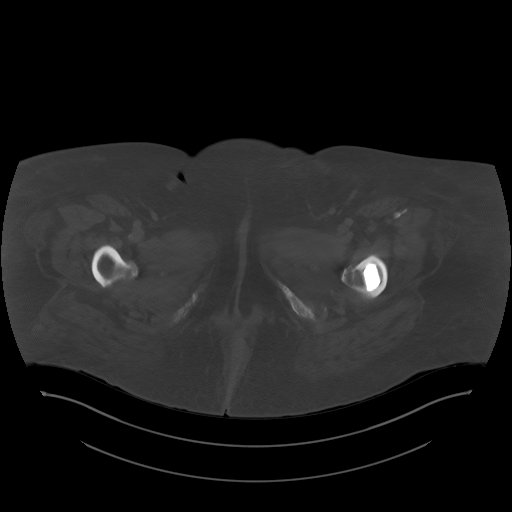
[im 12/104  soft-tissue]
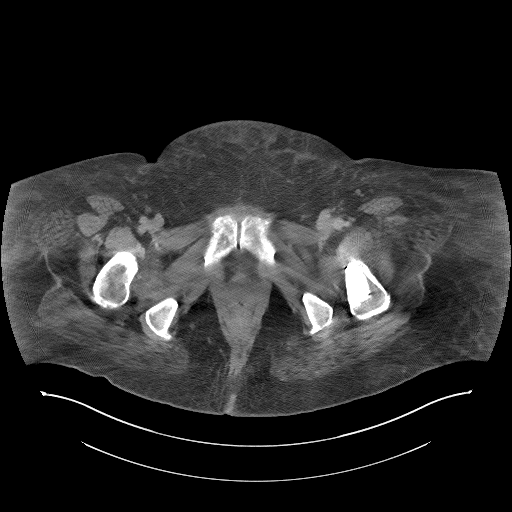
[im 23/104  soft-tissue]
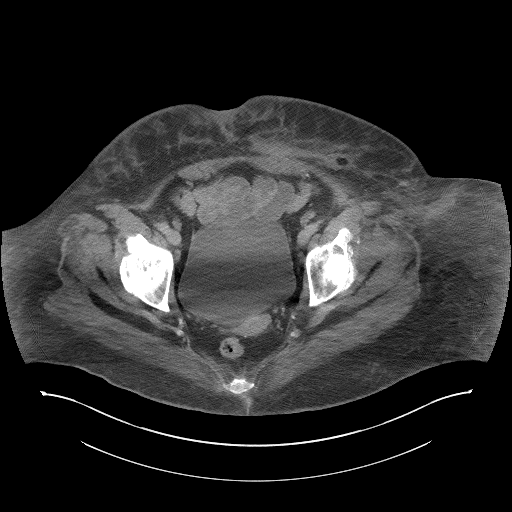
[im 29/104  soft-tissue]
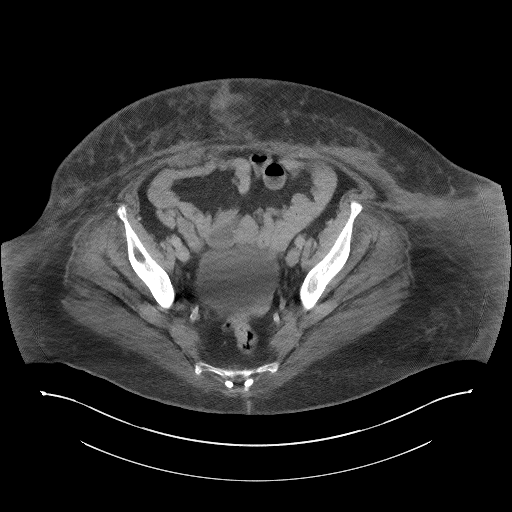
[im 35/104  soft-tissue]
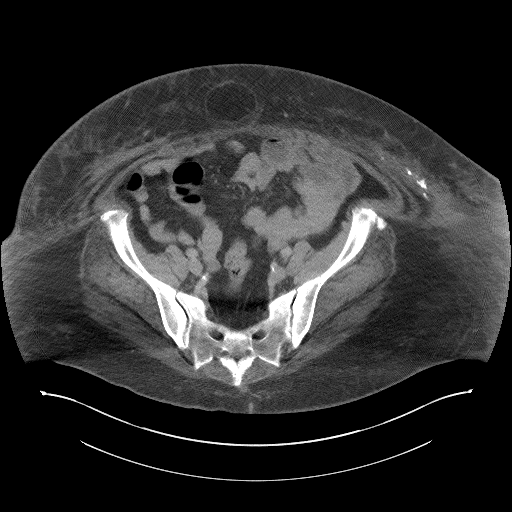
[im 46/104  soft-tissue]
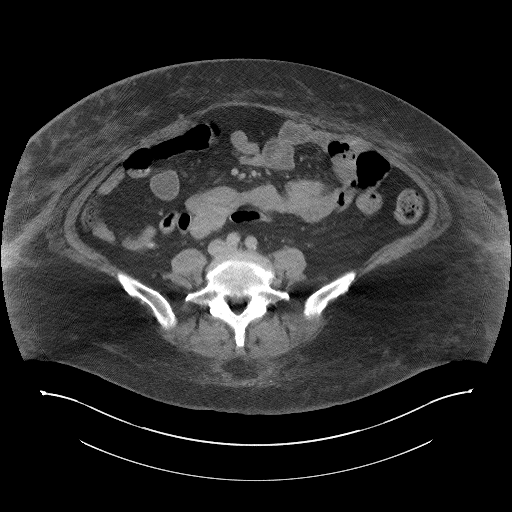
[im 52/104  soft-tissue]
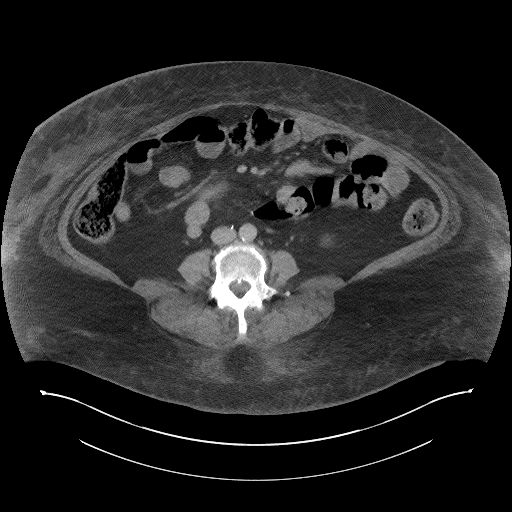
[im 58/104  soft-tissue]
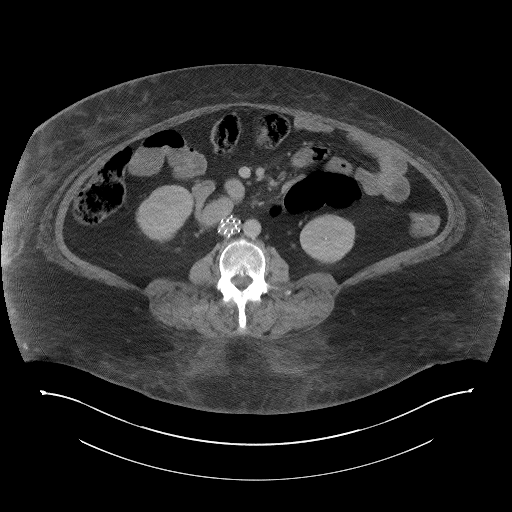
[im 69/104  soft-tissue]
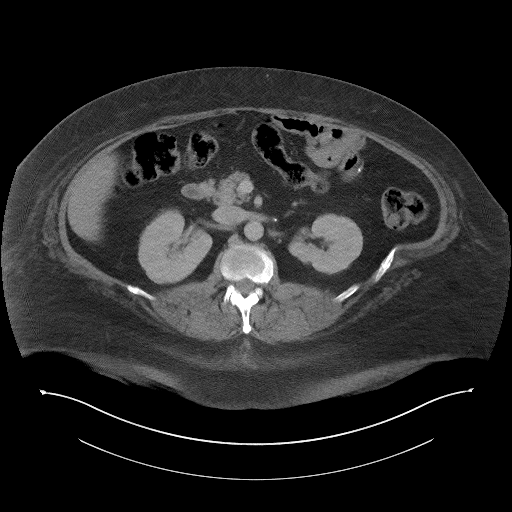
[im 69/104  bone]
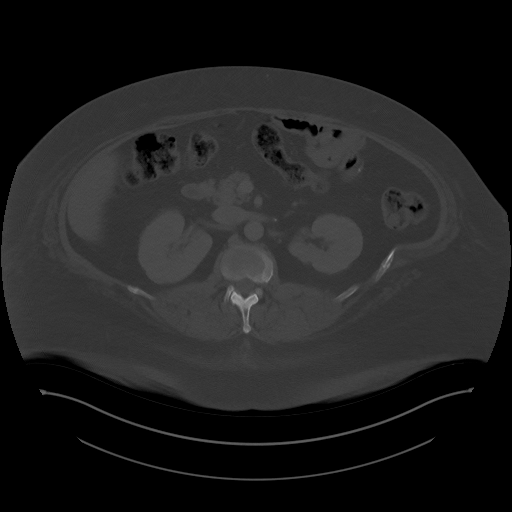
[im 75/104  soft-tissue]
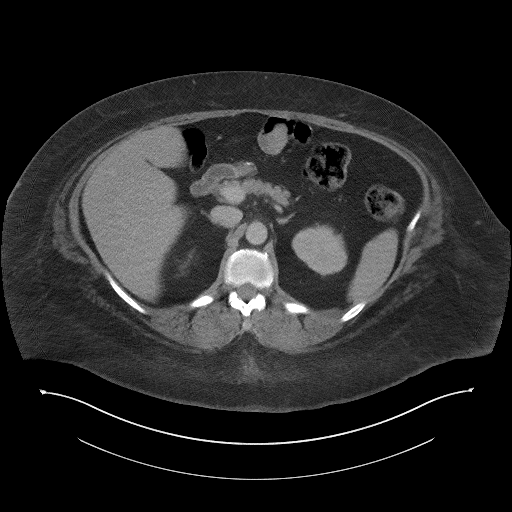
[im 81/104  soft-tissue]
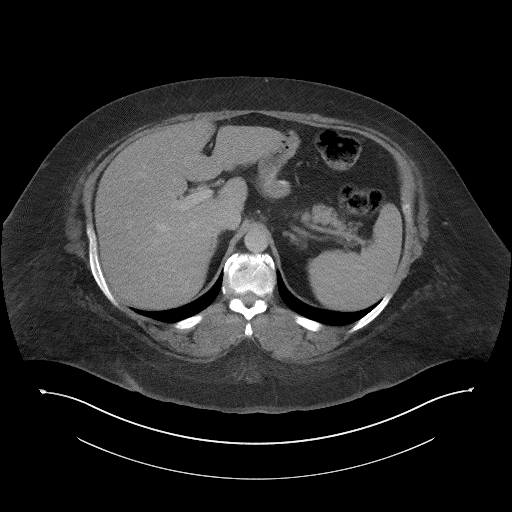
[im 92/104  soft-tissue]
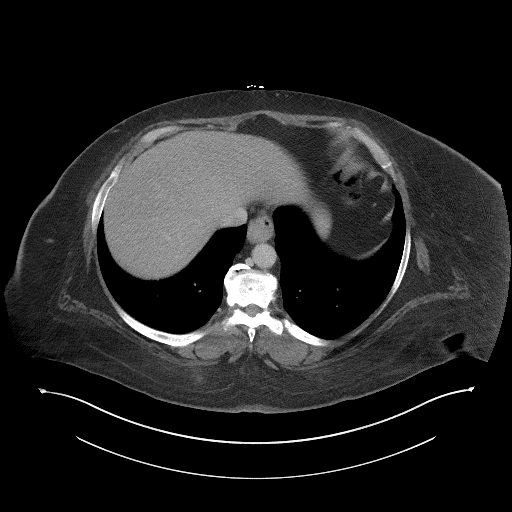
[im 98/104  soft-tissue]
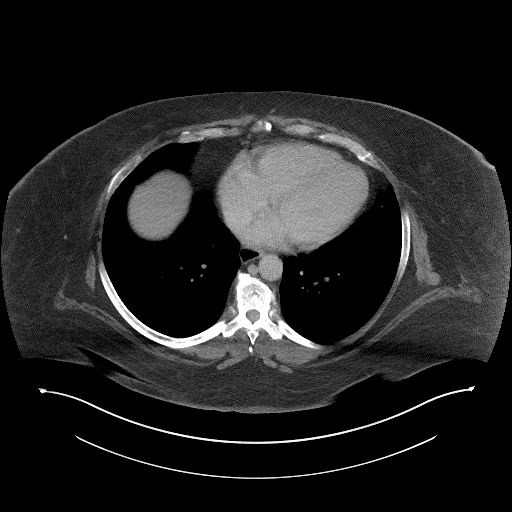

[Series 5: coronal st · coronal · 0.91mm/px · 3 of 118 slices shown]
[im 40/118  soft-tissue]
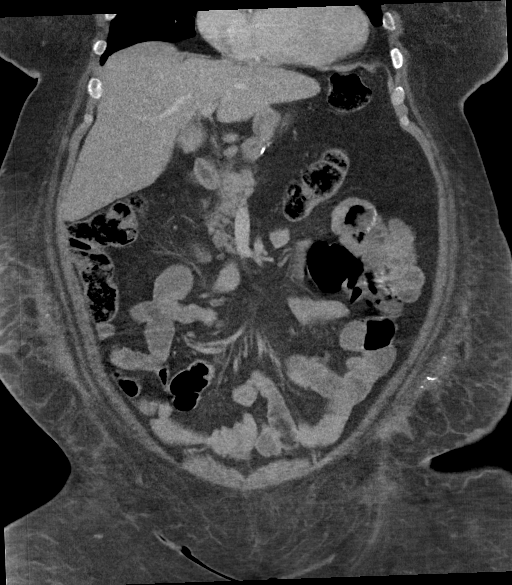
[im 53/118  soft-tissue]
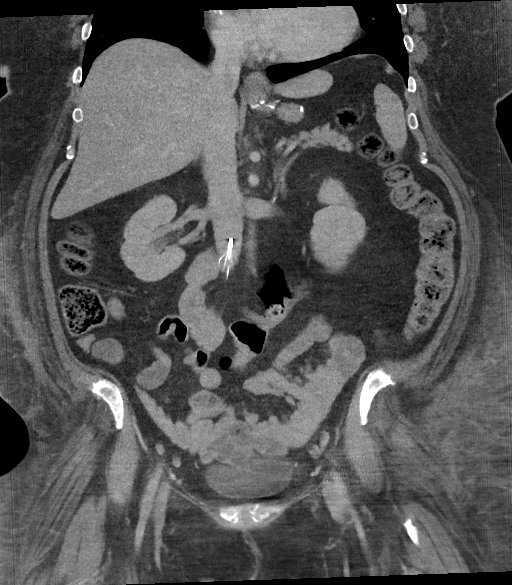
[im 66/118  soft-tissue]
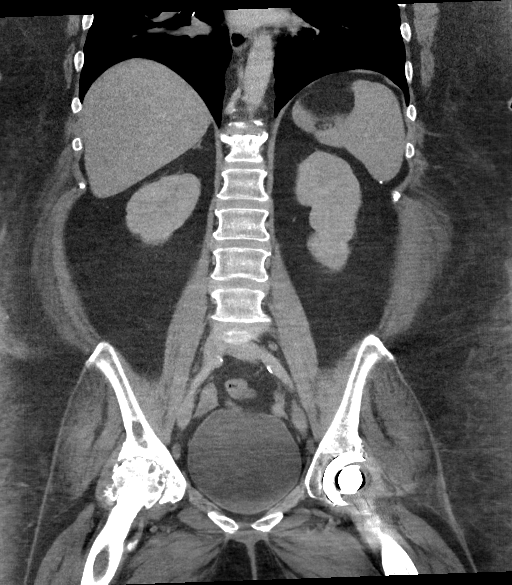

[16 of 46 positions shown; findings below may reference images not displayed]

FINDINGS: Lower chest: Lung bases demonstrate no acute consolidation or
effusion. The heart size is within normal limits.

Hepatobiliary: No focal liver abnormality is seen. Status post
cholecystectomy. No biliary dilatation.

Pancreas: Unremarkable. No pancreatic ductal dilatation or
surrounding inflammatory changes.

Spleen: Normal in size without focal abnormality.

Adrenals/Urinary Tract: Adrenal glands are normal. No
hydronephrosis. Punctate stones in the lower pole left kidney.
Bladder is normal

Stomach/Bowel: Status post gastric bypass. No dilated small bowel.
No colon wall thickening. Negative appendix.

Vascular/Lymphatic: Mild aortic atherosclerosis. No aneurysm. IVC
filter with apex below the level of the renal veins. No
significantly enlarged lymph node

.

Reproductive: Uterus and bilateral adnexa are unremarkable. Uterus
is somewhat atrophic in appearance.

Other: Negative for free air or free fluid. Soft tissue thickening
at the umbilicus with fat containing umbilical hernia. Moderate
generalized edema and soft tissue stranding within the subcutaneous
fat. Resolved left abdominal fluid collections with residual soft
tissue thickening and calcifications in the region. Linear soft
tissue density extending towards the skin surface at the left lower
quadrant may reflect previously demonstrated fistula. No gas or
fluid in the tract at this time.

Musculoskeletal: No acute or suspicious osseous abnormality.
Advanced arthritis right hip. Status post left hip replacement
IMPRESSION: 1. No CT evidence for acute intra-abdominal or pelvic abnormality.
2. Moderate edema and soft tissue stranding within the subcutaneous
fat of the abdominal wall. Resolved left lower lateral subcutaneous
fluid collections with residual thickening and calcification in the
region. Linear soft tissue densities extend to the skin surface and
may reflect previously demonstrated fistulas.
3. Nonobstructing left kidney stones

## 2020-07-14 DIAGNOSIS — E209 Hypoparathyroidism, unspecified: Secondary | ICD-10-CM | POA: Diagnosis not present

## 2020-07-14 DIAGNOSIS — E785 Hyperlipidemia, unspecified: Secondary | ICD-10-CM | POA: Diagnosis not present

## 2020-07-14 DIAGNOSIS — E559 Vitamin D deficiency, unspecified: Secondary | ICD-10-CM | POA: Diagnosis not present

## 2020-07-14 DIAGNOSIS — E1159 Type 2 diabetes mellitus with other circulatory complications: Secondary | ICD-10-CM | POA: Diagnosis not present

## 2020-07-14 DIAGNOSIS — R399 Unspecified symptoms and signs involving the genitourinary system: Secondary | ICD-10-CM | POA: Diagnosis not present

## 2020-07-14 DIAGNOSIS — E1169 Type 2 diabetes mellitus with other specified complication: Secondary | ICD-10-CM | POA: Diagnosis not present

## 2020-07-14 DIAGNOSIS — I152 Hypertension secondary to endocrine disorders: Secondary | ICD-10-CM | POA: Diagnosis not present

## 2020-07-14 DIAGNOSIS — R519 Headache, unspecified: Secondary | ICD-10-CM | POA: Diagnosis not present

## 2020-07-15 DIAGNOSIS — M797 Fibromyalgia: Secondary | ICD-10-CM | POA: Diagnosis not present

## 2020-07-15 DIAGNOSIS — G894 Chronic pain syndrome: Secondary | ICD-10-CM | POA: Diagnosis not present

## 2020-07-16 ENCOUNTER — Ambulatory Visit: Payer: Medicare HMO | Admitting: Urology

## 2020-07-16 DIAGNOSIS — J4540 Moderate persistent asthma, uncomplicated: Secondary | ICD-10-CM | POA: Diagnosis not present

## 2020-07-16 DIAGNOSIS — I152 Hypertension secondary to endocrine disorders: Secondary | ICD-10-CM | POA: Diagnosis not present

## 2020-07-16 DIAGNOSIS — I890 Lymphedema, not elsewhere classified: Secondary | ICD-10-CM | POA: Diagnosis not present

## 2020-07-16 DIAGNOSIS — E785 Hyperlipidemia, unspecified: Secondary | ICD-10-CM | POA: Diagnosis not present

## 2020-07-16 DIAGNOSIS — E1169 Type 2 diabetes mellitus with other specified complication: Secondary | ICD-10-CM | POA: Diagnosis not present

## 2020-07-16 DIAGNOSIS — Z23 Encounter for immunization: Secondary | ICD-10-CM | POA: Diagnosis not present

## 2020-07-16 DIAGNOSIS — E1159 Type 2 diabetes mellitus with other circulatory complications: Secondary | ICD-10-CM | POA: Diagnosis not present

## 2020-07-16 DIAGNOSIS — E559 Vitamin D deficiency, unspecified: Secondary | ICD-10-CM | POA: Diagnosis not present

## 2020-07-16 DIAGNOSIS — R0600 Dyspnea, unspecified: Secondary | ICD-10-CM | POA: Diagnosis not present

## 2020-07-16 DIAGNOSIS — E119 Type 2 diabetes mellitus without complications: Secondary | ICD-10-CM | POA: Diagnosis not present

## 2020-07-16 DIAGNOSIS — Z1211 Encounter for screening for malignant neoplasm of colon: Secondary | ICD-10-CM | POA: Diagnosis not present

## 2020-07-21 ENCOUNTER — Ambulatory Visit: Payer: Medicare HMO | Admitting: Urology

## 2020-07-22 ENCOUNTER — Ambulatory Visit: Payer: Medicare HMO | Admitting: Obstetrics & Gynecology

## 2020-07-28 ENCOUNTER — Ambulatory Visit: Payer: Medicare HMO | Admitting: Urology

## 2020-07-28 DIAGNOSIS — Z03818 Encounter for observation for suspected exposure to other biological agents ruled out: Secondary | ICD-10-CM | POA: Diagnosis not present

## 2020-07-28 DIAGNOSIS — J45909 Unspecified asthma, uncomplicated: Secondary | ICD-10-CM | POA: Diagnosis not present

## 2020-08-04 ENCOUNTER — Encounter: Payer: Self-pay | Admitting: Obstetrics & Gynecology

## 2020-08-04 ENCOUNTER — Other Ambulatory Visit: Payer: Self-pay

## 2020-08-04 ENCOUNTER — Ambulatory Visit (INDEPENDENT_AMBULATORY_CARE_PROVIDER_SITE_OTHER): Payer: Medicare HMO | Admitting: Obstetrics & Gynecology

## 2020-08-04 VITALS — BP 130/90 | Ht 64.0 in | Wt 369.0 lb

## 2020-08-04 DIAGNOSIS — Z1231 Encounter for screening mammogram for malignant neoplasm of breast: Secondary | ICD-10-CM | POA: Diagnosis not present

## 2020-08-04 DIAGNOSIS — E282 Polycystic ovarian syndrome: Secondary | ICD-10-CM

## 2020-08-04 DIAGNOSIS — Z01419 Encounter for gynecological examination (general) (routine) without abnormal findings: Secondary | ICD-10-CM

## 2020-08-04 DIAGNOSIS — N390 Urinary tract infection, site not specified: Secondary | ICD-10-CM

## 2020-08-04 DIAGNOSIS — Z1211 Encounter for screening for malignant neoplasm of colon: Secondary | ICD-10-CM

## 2020-08-04 LAB — POCT URINALYSIS DIPSTICK
Bilirubin, UA: NEGATIVE
Blood, UA: NEGATIVE
Glucose, UA: NEGATIVE
Ketones, UA: NEGATIVE
Leukocytes, UA: NEGATIVE
Nitrite, UA: NEGATIVE
Protein, UA: NEGATIVE
Spec Grav, UA: 1.01 (ref 1.010–1.025)
Urobilinogen, UA: 0.2 E.U./dL
pH, UA: 5 (ref 5.0–8.0)

## 2020-08-04 NOTE — Progress Notes (Signed)
HPI:      [name] is a 49 y.o. G0P0000 who LMP was No LMP recorded. Patient has had an injection., she presents today for her annual examination. The patient has no complaints today. The patient is currently sexually active. Her last pap: approximate date 2019 and was normal and last mammogram: approximate date 2021 and was normal. The patient does perform self breast exams.  There is no notable family history of breast or ovarian cancer in her family.  The patient has regular exercise: no.  The patient denies current symptoms of depression.    GYN History: Contraception: Depo-Provera injections  PMHx: Past Medical History:  Diagnosis Date  . Anemia   . Anginal pain ([place])   . Anxiety   . Arthralgia of hip 07/29/2015  . Arthritis   . Arthritis, degenerative 07/29/2015  . Asthma   . Cephalalgia 07/25/2014  . Dependence on unknown drug ([place])    multiplt controlled drug dependence  . Depression   . Diabetes mellitus without complication ([place])   . Difficult intubation    per pt needs small tube (has had abrasions from tube in past)  . Dysrhythmia    PVC's  . Eczema   . Fibromyalgia   . Gastritis   . GERD (gastroesophageal reflux disease)   . Gonalgia 07/29/2015   Overview:  Overview:  The patient has had bilateral intra-articular Hyalgan injections done on 07/16/2014 and although she seems to do well with this type of therapy, apparently her insurance company does not want to pay for they Hyalgan. On 11/27/2014 the patient underwent a bilateral genicular nerve block with excellent results. On 01/28/2015 she had a right knee genicular radiofrequency ablatio  . Gout   . H/O cardiovascular disorder 03/10/2015  . H/O surgical procedure 12/05/2012   Overview:  LSG ([facility] - April 2013)    . H/O thyroid disease 03/10/2015  . Headache   . Herpes   . History of artificial joint 07/29/2015  . History of hiatal hernia   . Hypertension   . Hypomagnesemia   . Hypothyroidism   . IDA  (iron deficiency anemia) 05/28/2019  . LBP (low back pain) 07/29/2015  . Neuromuscular disorder ([place])   . Obesity   . PCOS (polycystic ovarian syndrome)   . Primary osteoarthritis of both knees 07/29/2015  . Sleep apnea    not using a Cpap machine at this time - most recent test mild apnea does not qualify for test (not OSA)  . Thyroid nodule bilateral  . Umbilical hernia    Past Surgical History:  Procedure Laterality Date  . APPENDECTOMY    . CARPAL TUNNEL RELEASE Bilateral   . CHOLECYSTECTOMY    . DIAGNOSTIC LAPAROSCOPY    . HIATAL HERNIA REPAIR    . JOINT REPLACEMENT Bilateral hip  . LAPAROSCOPIC PARTIAL GASTRECTOMY    . left trigger finger    . peniculectomy N/A 07/05/2018  . ROUX-EN-Y GASTRIC BYPASS  06/03/2017  . SHOULDER ARTHROSCOPY Right   . THYROIDECTOMY N/A 11/12/2015   Procedure: THYROIDECTOMY;  Surgeon: Clyde Canterbury, MD;  Location: [facility] ORS;  Service: ENT;  Laterality: N/A;  . TOTAL HIP ARTHROPLASTY Right 11/27/2019   Procedure: TOTAL HIP ARTHROPLASTY ANTERIOR APPROACH;  Surgeon: Hessie Knows, MD;  Location: [facility] ORS;  Service: Orthopedics;  Laterality: Right;  . TRIGGER FINGER RELEASE Right   . TRIGGER FINGER RELEASE Right 04/24/2020   Procedure: Right ring and middle trigger release;  Surgeon: Hessie Knows, MD;  Location: [facility]  ORS;  Service: Orthopedics;  Laterality: Right;   Family History  Problem Relation Age of Onset  . Anxiety disorder Mother   . Depression Mother   . Alcohol abuse Mother   . Diabetes Mother   . Hypertension Mother   . Kidney cancer Mother   . Sleep apnea Mother   . Alcohol abuse Father   . Anxiety disorder Father   . Depression Father   . Post-traumatic stress disorder Father   . Kidney failure Father   . COPD Father   . Diabetes Father   . Hypertension Father   . Sleep apnea Father   . Depression Brother   . Diabetes Brother   . Hypertension Brother   . Sleep apnea Brother   . Breast cancer Paternal Aunt   . Bladder Cancer Neg  Hx   . Prostate cancer Neg Hx    Social History   Tobacco Use  . Smoking status: Never Smoker  . Smokeless tobacco: Never Used  Vaping Use  . Vaping Use: Never used  Substance Use Topics  . Alcohol use: No    Alcohol/week: 0.0 standard drinks  . Drug use: No    Current Outpatient Medications:  .  ACCU-CHEK AVIVA PLUS test strip, , Disp: , Rfl:  .  acetaminophen (TYLENOL) 650 MG CR tablet, Take 650-1,300 mg by mouth every 8 (eight) hours as needed for pain., Disp: , Rfl:  .  acidophilus (RISAQUAD) CAPS capsule, Take 1 capsule by mouth daily., Disp: , Rfl:  .  albuterol (PROVENTIL HFA;VENTOLIN HFA) 108 (90 BASE) MCG/ACT inhaler, Inhale 2 puffs into the lungs every 6 (six) hours as needed for wheezing or shortness of breath., Disp: , Rfl:  .  amitriptyline (ELAVIL) 100 MG tablet, Take 100 mg by mouth at bedtime., Disp: , Rfl:  .  Ascorbic Acid (VITAMIN C) 1000 MG tablet, Take 1,000 mg by mouth daily. , Disp: , Rfl:  .  Biotin 10 MG CAPS, Take 10 mg by mouth daily. , Disp: , Rfl:  .  butalbital-acetaminophen-caffeine (FIORICET) 50-325-40 MG tablet, Take 1 tablet by mouth every 6 (six) hours as needed for migraine. , Disp: , Rfl:  .  calcium carbonate (TUMS EX) 750 MG chewable tablet, Chew 1 tablet by mouth daily. , Disp: , Rfl:  .  clonazePAM (KLONOPIN) 0.5 MG tablet, Take 1 tablet (0.5 mg total) by mouth 3 (three) times daily as needed for anxiety., Disp: 30 tablet, Rfl: 0 .  Cyanocobalamin (B-12) 2500 MCG TABS, Take 2,500 mcg by mouth daily., Disp: , Rfl:  .  diclofenac sodium (VOLTAREN) 1 % GEL, Apply 2 g topically 4 (four) times daily. (Patient taking differently: Apply 2 g topically 3 (three) times daily as needed (pain). ), Disp: 100 g, Rfl: 1 .  diphenhydrAMINE (BENADRYL) 25 MG tablet, Take 25 mg by mouth every 8 (eight) hours as needed for itching or allergies. , Disp: , Rfl:  .  empagliflozin (JARDIANCE) 25 MG TABS tablet, Take 25 mg by mouth daily., Disp: , Rfl:  .  EPINEPHrine  0.3 mg/0.3 mL IJ SOAJ injection, Inject 0.3 mg into the muscle as needed for anaphylaxis. , Disp: , Rfl:  .  ergocalciferol (VITAMIN D2) 1.25 MG (50000 UT) capsule, Take 50,000 Units by mouth 2 (two) times a week. , Disp: , Rfl:  .  famotidine (PEPCID) 20 MG tablet, Take 20 mg by mouth at bedtime. , Disp: , Rfl:  .  glucose blood (ACCU-CHEK AVIVA PLUS) test strip, Use 2 (  two) times daily, Disp: , Rfl:  .  HYDROcodone-acetaminophen (NORCO) 7.5-325 MG tablet, Take 1 tablet by mouth every 6 (six) hours as needed for moderate pain., Disp: 30 tablet, Rfl: 0 .  levocetirizine (XYZAL) 5 MG tablet, Take 5 mg by mouth at bedtime. , Disp: , Rfl:  .  magnesium oxide (MAG-OX) 400 MG tablet, Take 800-1,200 mg by mouth See admin instructions. Take 1200 mg by mouth in the morning and 800 mg at bedtime, Disp: , Rfl:  .  meclizine (ANTIVERT) 25 MG tablet, Take 25 mg by mouth 3 (three) times daily as needed for dizziness. , Disp: , Rfl:  .  medroxyPROGESTERone (DEPO-PROVERA) 150 MG/ML injection, Inject 150 mg into the muscle every 3 (three) months. , Disp: , Rfl:  .  metFORMIN (GLUCOPHAGE) 1000 MG tablet, Take 1,000 mg by mouth 2 (two) times daily with a meal. , Disp: , Rfl:  .  metoprolol tartrate (LOPRESSOR) 50 MG tablet, Take 75 mg by mouth 2 (two) times daily. , Disp: , Rfl:  .  montelukast (SINGULAIR) 10 MG tablet, Take 10 mg by mouth at bedtime. , Disp: , Rfl:  .  naloxone (NARCAN) nasal spray 4 mg/0.1 mL, Spray into one nostril. Repeat with second device into other nostril after 2-3 minutes if no or minimal response., Disp: 1 kit, Rfl: 0 .  naphazoline-pheniramine (NAPHCON-A) 0.025-0.3 % ophthalmic solution, Place 1 drop into both eyes 4 (four) times daily as needed for eye irritation., Disp: , Rfl:  .  omeprazole (PRILOSEC) 40 MG capsule, Take 40 mg by mouth in the morning and at bedtime., Disp: , Rfl:  .  oxyCODONE (OXY IR/ROXICODONE) 5 MG immediate release tablet, Take 1 tablet (5 mg total) by mouth 5 (five)  times daily. Must last 30 days, Disp: 150 tablet, Rfl: 0 .  [START ON 08/08/2020] oxyCODONE (OXY IR/ROXICODONE) 5 MG immediate release tablet, Take 1 tablet (5 mg total) by mouth 5 (five) times daily. Must last 30 days, Disp: 150 tablet, Rfl: 0 .  [START ON 09/07/2020] oxyCODONE (OXY IR/ROXICODONE) 5 MG immediate release tablet, Take 1 tablet (5 mg total) by mouth 5 (five) times daily. Must last 30 days, Disp: 150 tablet, Rfl: 0 .  pregabalin (LYRICA) 225 MG capsule, Take 1 capsule (225 mg total) by mouth 2 (two) times daily., Disp: 180 capsule, Rfl: 0 .  QUEtiapine (SEROQUEL) 100 MG tablet, Take 100 mg by mouth at bedtime., Disp: , Rfl:  .  rosuvastatin (CRESTOR) 40 MG tablet, Take 40 mg by mouth daily. , Disp: , Rfl:  .  sertraline (ZOLOFT) 100 MG tablet, Take 200 mg by mouth daily. , Disp: , Rfl:  .  spironolactone (ALDACTONE) 25 MG tablet, Take 37.5 mg by mouth 2 (two) times daily. , Disp: , Rfl:  .  tiZANidine (ZANAFLEX) 4 MG tablet, Take 1 tablet (4 mg total) by mouth 2 (two) times daily as needed for muscle spasms., Disp: 30 tablet, Rfl: 0 .  topiramate (TOPAMAX) 50 MG tablet, Take 50 mg by mouth 2 (two) times daily., Disp: , Rfl:  .  triamcinolone (NASACORT AQ) 55 MCG/ACT AERO nasal inhaler, Place 2 sprays into the nose 2 (two) times daily as needed (allergies). , Disp: , Rfl:  .  zolpidem (AMBIEN) 10 MG tablet, Take 10 mg by mouth at bedtime as needed for sleep., Disp: , Rfl:  .  zonisamide (ZONEGRAN) 50 MG capsule, Take 150 mg by mouth at bedtime. , Disp: , Rfl:  .  levothyroxine (SYNTHROID)  125 MCG tablet, Take 250 mcg by mouth daily before breakfast. , Disp: , Rfl:  .  lisinopril (ZESTRIL) 5 MG tablet, Take 5 mg by mouth at bedtime. , Disp: , Rfl:  Allergies: Bactrim [sulfamethoxazole-trimethoprim], Omalizumab, Ciprofloxacin, Shellfish allergy, and Nsaids  Review of Systems  Constitutional: Positive for malaise/fatigue. Negative for chills and fever.  HENT: Negative for congestion,  sinus pain and sore throat.   Eyes: Negative for blurred vision and pain.  Respiratory: Negative for cough and wheezing.   Cardiovascular: Negative for chest pain and leg swelling.  Gastrointestinal: Negative for abdominal pain, constipation, diarrhea, heartburn, nausea and vomiting.  Genitourinary: Negative for dysuria, frequency, hematuria and urgency.  Musculoskeletal: Negative for back pain, joint pain, myalgias and neck pain.  Skin: Negative for itching and rash.  Neurological: Negative for dizziness, tremors and weakness.  Endo/Heme/Allergies: Does not bruise/bleed easily.  Psychiatric/Behavioral: Negative for depression. The patient is not nervous/anxious and does not have insomnia.     Objective: BP 130/90   Ht '5\' 4"'  (1.626 m)   Wt (!) 369 lb (167.4 kg)   BMI 63.34 kg/m   Filed Weights   08/04/20 1329  Weight: (!) 369 lb (167.4 kg)   Body mass index is 63.34 kg/m. Physical Exam Constitutional:      General: She is not in acute distress.    Appearance: She is well-developed. She is obese.  Genitourinary:     Pelvic exam was performed with patient supine.     Vagina, uterus and rectum normal.     No lesions in the vagina.     No vaginal bleeding.     No cervical motion tenderness, friability, lesion or polyp.     Uterus is mobile.     Uterus is not enlarged.     No uterine mass detected.    Uterus is midaxial.     No right or left adnexal mass present.     Right adnexa not tender.     Left adnexa not tender.  HENT:     Head: Normocephalic and atraumatic. No laceration.     Right Ear: Hearing normal.     Left Ear: Hearing normal.     Mouth/Throat:     Pharynx: Uvula midline.  Eyes:     Pupils: Pupils are equal, round, and reactive to light.  Neck:     Thyroid: No thyromegaly.  Cardiovascular:     Rate and Rhythm: Normal rate and regular rhythm.     Heart sounds: No murmur heard.  No friction rub. No gallop.   Pulmonary:     Effort: Pulmonary effort is  normal. No respiratory distress.     Breath sounds: Normal breath sounds. No wheezing.  Chest:     Breasts:        Right: No mass, skin change or tenderness.        Left: No mass, skin change or tenderness.  Abdominal:     General: Bowel sounds are normal. There is no distension.     Palpations: Abdomen is soft.     Tenderness: There is no abdominal tenderness. There is no rebound.  Musculoskeletal:        General: Normal range of motion.     Cervical back: Normal range of motion and neck supple.  Neurological:     Mental Status: She is alert and oriented to person, place, and time.     Cranial Nerves: No cranial nerve deficit.  Skin:    General: Skin is  warm and dry.  Psychiatric:        Judgment: Judgment normal.  Vitals reviewed.     Assessment:  ANNUAL EXAM 1. Frequent UTI   2. Women's annual routine gynecological examination   3. Morbid obesity ([place])   4. Screen for colon cancer   5. Encounter for screening mammogram for malignant neoplasm of breast   6. PCOS (polycystic ovarian syndrome)      Screening Plan:            1.  Cervical Screening-  Pap smear schedule reviewed with patient  2. Breast screening- Exam annually and mammogram>40 planned   3. Colonoscopy every 10 years, Hemoccult testing - after age 72  4. Labs managed by PCP  5. Counseling for contraception: Due to age, will come off of Depo.  Consider labs in 3-4 mos.  FSH, LH, Estradiol.  Ordered.    6. PCOS (polycystic ovarian syndrome) - Also consider age for menopause - FSH/LH; Future - Estradiol; Future - Cont Spironolactone     F/U  Return in about 1 year (around 08/04/2021) for Annual, also LAB APPT in [facility] 2022.  Barnett Applebaum, MD, [facility], [facility] 08/04/2020  2:21 PM

## 2020-08-04 NOTE — Patient Instructions (Signed)
PAP every three years Mammogram every year    Call [phone] to schedule at [facility] Colonoscopy every 10 years Labs in [facility] 2022 after coming off of DEPO  Thank you for choosing [facility]. As part of our ongoing efforts to improve patient experience, we would appreciate your feedback. Please fill out the short survey that you will receive by mail or MyChart. Your opinion is important to [place]! - Dr. Kenton Kingfisher

## 2020-08-04 NOTE — Patient Outreach (Signed)
[facility]) Care Management  08/04/2020  Karen Lewis 1971/07/24 [id number]   Telephone call to patient for disease management follow up.   No answer.  Voice mail full unable to leave a message.   Plan: If no return call, RN CM will attempt patient again in the month of January.  Jone Baseman, RN, MSN [facility] Care Management Coordinator Direct Line [phone] Cell [phone] Toll Free: [phone]  Fax: [phone]

## 2020-08-05 ENCOUNTER — Ambulatory Visit: Payer: Self-pay

## 2020-08-11 ENCOUNTER — Encounter: Payer: Self-pay | Admitting: Urology

## 2020-08-11 ENCOUNTER — Ambulatory Visit (INDEPENDENT_AMBULATORY_CARE_PROVIDER_SITE_OTHER): Payer: Medicare HMO | Admitting: Urology

## 2020-08-11 ENCOUNTER — Other Ambulatory Visit: Payer: Self-pay

## 2020-08-11 VITALS — BP 151/90 | HR 94 | Ht 64.0 in | Wt 369.0 lb

## 2020-08-11 DIAGNOSIS — N390 Urinary tract infection, site not specified: Secondary | ICD-10-CM | POA: Diagnosis not present

## 2020-08-11 DIAGNOSIS — R519 Headache, unspecified: Secondary | ICD-10-CM | POA: Diagnosis not present

## 2020-08-11 DIAGNOSIS — N3020 Other chronic cystitis without hematuria: Secondary | ICD-10-CM

## 2020-08-11 DIAGNOSIS — G501 Atypical facial pain: Secondary | ICD-10-CM | POA: Diagnosis not present

## 2020-08-11 MED ORDER — NITROFURANTOIN MONOHYD MACRO 100 MG PO CAPS
100.0000 mg | ORAL_CAPSULE | Freq: Every day | ORAL | 11 refills | Status: DC
Start: 2020-08-11 — End: 2020-12-01

## 2020-08-11 NOTE — Progress Notes (Signed)
08/11/2020 3:07 PM   Karen Lewis [birth year] [id number]  Referring provider: Idelle Crouch, MD [facility] [place],  [place] [address]  No chief complaint on file.   HPI: The patient reports an infection at least every 3 months when she feels tired foul-smelling urine burning and respond favorably antibiotics.  She had the same problem years ago.  She recently had MRSA.  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  Gets up once a night.  No hysterectomy.  On oral hypoglycemics.  No bladder surgery kidney stones   PMH: Past Medical History:  Diagnosis Date  . Anemia   . Anginal pain ([place])   . Anxiety   . Arthralgia of hip 07/29/2015  . Arthritis   . Arthritis, degenerative 07/29/2015  . Asthma   . Cephalalgia 07/25/2014  . Dependence on unknown drug ([place])    multiplt controlled drug dependence  . Depression   . Diabetes mellitus without complication ([place])   . Difficult intubation    per pt needs small tube (has had abrasions from tube in past)  . Dysrhythmia    PVC's  . Eczema   . Fibromyalgia   . Gastritis   . GERD (gastroesophageal reflux disease)   . Gonalgia 07/29/2015   Overview:  Overview:  The patient has had bilateral intra-articular Hyalgan injections done on 07/16/2014 and although she seems to do well with this type of therapy, apparently her insurance company does not want to pay for they Hyalgan. On 11/27/2014 the patient underwent a bilateral genicular nerve block with excellent results. On 01/28/2015 she had a right knee genicular radiofrequency ablatio  . Gout   . H/O cardiovascular disorder 03/10/2015  . H/O surgical procedure 12/05/2012   Overview:  LSG ([facility] - April 2013)    . H/O thyroid disease 03/10/2015  . Headache   . Herpes   . History of artificial joint 07/29/2015  . History  of hiatal hernia   . Hypertension   . Hypomagnesemia   . Hypothyroidism   . IDA (iron deficiency anemia) 05/28/2019  . LBP (low back pain) 07/29/2015  . Neuromuscular disorder ([place])   . Obesity   . PCOS (polycystic ovarian syndrome)   . Primary osteoarthritis of both knees 07/29/2015  . Sleep apnea    not using a Cpap machine at this time - most recent test mild apnea does not qualify for test (not OSA)  . Thyroid nodule bilateral  . Umbilical hernia     Surgical History: Past Surgical History:  Procedure Laterality Date  . APPENDECTOMY    . CARPAL TUNNEL RELEASE Bilateral   . CHOLECYSTECTOMY    . DIAGNOSTIC LAPAROSCOPY    . HIATAL HERNIA REPAIR    . JOINT REPLACEMENT Bilateral hip  . LAPAROSCOPIC PARTIAL GASTRECTOMY    . left trigger finger    . peniculectomy N/A 07/05/2018  . ROUX-EN-Y GASTRIC BYPASS  06/03/2017  . SHOULDER ARTHROSCOPY Right   . THYROIDECTOMY N/A 11/12/2015   Procedure: THYROIDECTOMY;  Surgeon: Clyde Canterbury, MD;  Location: [facility] ORS;  Service: ENT;  Laterality: N/A;  . TOTAL HIP ARTHROPLASTY Right 11/27/2019   Procedure: TOTAL HIP ARTHROPLASTY ANTERIOR APPROACH;  Surgeon: Hessie Knows, MD;  Location: [facility] ORS;  Service: Orthopedics;  Laterality: Right;  . TRIGGER FINGER RELEASE Right   . TRIGGER FINGER  RELEASE Right 04/24/2020   Procedure: Right ring and middle trigger release;  Surgeon: Hessie Knows, MD;  Location: [facility] ORS;  Service: Orthopedics;  Laterality: Right;    Home Medications:  Allergies as of 08/11/2020      Reactions   Bactrim [sulfamethoxazole-trimethoprim] Hives   Omalizumab Itching, Hives   Ciprofloxacin Other (See Comments)   myalgia   Shellfish Allergy Other (See Comments)   + positive allergy test   Nsaids Nausea Only, Other (See Comments)   Stomach upset      Medication List       Accurate as of August 11, 2020  3:07 PM. If you have any questions, ask your nurse or doctor.        Accu-Chek Aviva Plus test strip Generic  drug: glucose blood Use 2 (two) times daily   Accu-Chek Aviva Plus test strip Generic drug: glucose blood   acetaminophen 650 MG CR tablet Commonly known as: TYLENOL Take 650-1,300 mg by mouth every 8 (eight) hours as needed for pain.   acidophilus Caps capsule Take 1 capsule by mouth daily.   albuterol 108 (90 Base) MCG/ACT inhaler Commonly known as: VENTOLIN HFA Inhale 2 puffs into the lungs every 6 (six) hours as needed for wheezing or shortness of breath.   amitriptyline 100 MG tablet Commonly known as: ELAVIL Take 100 mg by mouth at bedtime.   B-12 2500 MCG Tabs Take 2,500 mcg by mouth daily.   Biotin 10 MG Caps Take 10 mg by mouth daily.   butalbital-acetaminophen-caffeine 50-325-40 MG tablet Commonly known as: FIORICET Take 1 tablet by mouth every 6 (six) hours as needed for migraine.   calcium carbonate 750 MG chewable tablet Commonly known as: TUMS EX Chew 1 tablet by mouth daily.   clonazePAM 0.5 MG tablet Commonly known as: KLONOPIN Take 1 tablet (0.5 mg total) by mouth 3 (three) times daily as needed for anxiety.   diclofenac sodium 1 % Gel Commonly known as: Voltaren Apply 2 g topically 4 (four) times daily. What changed:   when to take this  reasons to take this   diphenhydrAMINE 25 MG tablet Commonly known as: BENADRYL Take 25 mg by mouth every 8 (eight) hours as needed for itching or allergies.   EPINEPHrine 0.3 mg/0.3 mL Soaj injection Commonly known as: EPI-PEN Inject 0.3 mg into the muscle as needed for anaphylaxis.   ergocalciferol 1.25 MG (50000 UT) capsule Commonly known as: VITAMIN D2 Take 50,000 Units by mouth 2 (two) times a week.   famotidine 20 MG tablet Commonly known as: PEPCID Take 20 mg by mouth at bedtime.   HYDROcodone-acetaminophen 7.5-325 MG tablet Commonly known as: Norco Take 1 tablet by mouth every 6 (six) hours as needed for moderate pain.   Jardiance 25 MG Tabs tablet Generic drug: empagliflozin Take 25 mg  by mouth daily.   levocetirizine 5 MG tablet Commonly known as: XYZAL Take 5 mg by mouth at bedtime.   levothyroxine 125 MCG tablet Commonly known as: SYNTHROID Take 250 mcg by mouth daily before breakfast.   lisinopril 5 MG tablet Commonly known as: ZESTRIL Take 5 mg by mouth at bedtime.   magnesium oxide 400 MG tablet Commonly known as: MAG-OX Take 800-1,200 mg by mouth See admin instructions. Take 1200 mg by mouth in the morning and 800 mg at bedtime   meclizine 25 MG tablet Commonly known as: ANTIVERT Take 25 mg by mouth 3 (three) times daily as needed for dizziness.   medroxyPROGESTERone 150 MG/ML injection  Commonly known as: DEPO-PROVERA Inject 150 mg into the muscle every 3 (three) months.   metFORMIN 1000 MG tablet Commonly known as: GLUCOPHAGE Take 1,000 mg by mouth 2 (two) times daily with a meal.   metoprolol tartrate 50 MG tablet Commonly known as: LOPRESSOR Take 75 mg by mouth 2 (two) times daily.   naloxone 4 MG/0.1ML Liqd nasal spray kit Commonly known as: Narcan Spray into one nostril. Repeat with second device into other nostril after 2-3 minutes if no or minimal response.   naphazoline-pheniramine 0.025-0.3 % ophthalmic solution Commonly known as: NAPHCON-A Place 1 drop into both eyes 4 (four) times daily as needed for eye irritation.   Nasacort AQ 55 MCG/ACT Aero nasal inhaler Generic drug: triamcinolone Place 2 sprays into the nose 2 (two) times daily as needed (allergies).   omeprazole 40 MG capsule Commonly known as: PRILOSEC Take 40 mg by mouth in the morning and at bedtime.   oxyCODONE 5 MG immediate release tablet Commonly known as: Oxy IR/ROXICODONE Take 1 tablet (5 mg total) by mouth 5 (five) times daily. Must last 30 days   oxyCODONE 5 MG immediate release tablet Commonly known as: Oxy IR/ROXICODONE Take 1 tablet (5 mg total) by mouth 5 (five) times daily. Must last 30 days   oxyCODONE 5 MG immediate release tablet Commonly known  as: Oxy IR/ROXICODONE Take 1 tablet (5 mg total) by mouth 5 (five) times daily. Must last 30 days Start taking on: September 07, 2020   pregabalin 225 MG capsule Commonly known as: LYRICA Take 1 capsule (225 mg total) by mouth 2 (two) times daily.   QUEtiapine 100 MG tablet Commonly known as: SEROQUEL Take 100 mg by mouth at bedtime.   rosuvastatin 40 MG tablet Commonly known as: CRESTOR Take 40 mg by mouth daily.   sertraline 100 MG tablet Commonly known as: ZOLOFT Take 200 mg by mouth daily.   Singulair 10 MG tablet Generic drug: montelukast Take 10 mg by mouth at bedtime.   spironolactone 25 MG tablet Commonly known as: ALDACTONE Take 37.5 mg by mouth 2 (two) times daily.   tiZANidine 4 MG tablet Commonly known as: ZANAFLEX Take 1 tablet (4 mg total) by mouth 2 (two) times daily as needed for muscle spasms.   topiramate 50 MG tablet Commonly known as: TOPAMAX Take 50 mg by mouth 2 (two) times daily.   vitamin C 1000 MG tablet Take 1,000 mg by mouth daily.   zolpidem 10 MG tablet Commonly known as: AMBIEN Take 10 mg by mouth at bedtime as needed for sleep.   zonisamide 50 MG capsule Commonly known as: ZONEGRAN Take 150 mg by mouth at bedtime.       Allergies:  Allergies  Allergen Reactions  . Bactrim [Sulfamethoxazole-Trimethoprim] Hives  . Omalizumab Itching and Hives  . Ciprofloxacin Other (See Comments)    myalgia   . Shellfish Allergy Other (See Comments)    + positive allergy test  . Nsaids Nausea Only and Other (See Comments)    Stomach upset    Family History: Family History  Problem Relation Age of Onset  . Anxiety disorder Mother   . Depression Mother   . Alcohol abuse Mother   . Diabetes Mother   . Hypertension Mother   . Kidney cancer Mother   . Sleep apnea Mother   . Alcohol abuse Father   . Anxiety disorder Father   . Depression Father   . Post-traumatic stress disorder Father   . Kidney failure Father   .  COPD Father   .  Diabetes Father   . Hypertension Father   . Sleep apnea Father   . Depression Brother   . Diabetes Brother   . Hypertension Brother   . Sleep apnea Brother   . Breast cancer Paternal Aunt   . Bladder Cancer Neg Hx   . Prostate cancer Neg Hx     Social History:  reports that she has never smoked. She has never used smokeless tobacco. She reports that she does not drink alcohol and does not use drugs.  ROS:                                        Physical Exam: There were no vitals taken for this visit.  Constitutional:  Alert and oriented, No acute distress.  Laboratory Data: Lab Results  Component Value Date   WBC 12.4 (H) 04/16/2020   HGB 13.5 04/16/2020   HCT 40.9 04/16/2020   MCV 82.0 04/16/2020   PLT 273 04/16/2020    Lab Results  Component Value Date   CREATININE 0.81 04/16/2020    No results found for: PSA  No results found for: TESTOSTERONE  Lab Results  Component Value Date   HGBA1C 7.1 (H) 11/27/2019    Urinalysis    Component Value Date/Time   COLORURINE AMBER (A) 12/11/2019 1700   APPEARANCEUR CLOUDY (A) 12/11/2019 1700   APPEARANCEUR Cloudy (A) 09/14/2017 1236   LABSPEC 1.015 12/11/2019 1700   LABSPEC 1.003 03/25/2013 2018   PHURINE 6.0 12/11/2019 1700   GLUCOSEU NEGATIVE 12/11/2019 1700   GLUCOSEU Negative 03/25/2013 2018   HGBUR NEGATIVE 12/11/2019 1700   BILIRUBINUR neg 08/04/2020 1345   BILIRUBINUR Negative 06/21/2017 0944   BILIRUBINUR Negative 03/25/2013 2018   KETONESUR NEGATIVE 12/11/2019 1700   PROTEINUR Negative 08/04/2020 1345   PROTEINUR 30 (A) 12/11/2019 1700   UROBILINOGEN 0.2 08/04/2020 1345   UROBILINOGEN 0.2 05/09/2015 0115   NITRITE neg 08/04/2020 1345   NITRITE NEGATIVE 12/11/2019 1700   LEUKOCYTESUR Negative 08/04/2020 1345   LEUKOCYTESUR TRACE (A) 12/11/2019 1700   LEUKOCYTESUR 2+ 03/25/2013 2018    Pertinent Imaging: Reviewed.  Urine reviewed.  Urine sent for culture.  Assessment &  Plan: Work-up and pathophysiology of chronic cystitis discussed.  Call if ultrasound normal.  Call culture normal.  Reassess in 6 weeks on daily Macrodantin 100 mg 3x11  There are no diagnoses linked to this encounter.  No follow-ups on file.  Reece Packer, MD  [facility] [address] [place], [place] [address] [phone]

## 2020-08-12 LAB — MICROSCOPIC EXAMINATION: Bacteria, UA: NONE SEEN

## 2020-08-12 LAB — URINALYSIS, COMPLETE
Bilirubin, UA: NEGATIVE
Glucose, UA: NEGATIVE
Ketones, UA: NEGATIVE
Leukocytes,UA: NEGATIVE
Nitrite, UA: NEGATIVE
Protein,UA: NEGATIVE
RBC, UA: NEGATIVE
Specific Gravity, UA: 1.02 (ref 1.005–1.030)
Urobilinogen, Ur: 0.2 mg/dL (ref 0.2–1.0)
pH, UA: 7 (ref 5.0–7.5)

## 2020-08-13 LAB — CULTURE, URINE COMPREHENSIVE

## 2020-08-29 DIAGNOSIS — E118 Type 2 diabetes mellitus with unspecified complications: Secondary | ICD-10-CM | POA: Diagnosis not present

## 2020-08-29 DIAGNOSIS — Z96641 Presence of right artificial hip joint: Secondary | ICD-10-CM | POA: Diagnosis not present

## 2020-08-29 DIAGNOSIS — L03211 Cellulitis of face: Secondary | ICD-10-CM | POA: Diagnosis not present

## 2020-08-31 ENCOUNTER — Other Ambulatory Visit: Payer: Self-pay

## 2020-08-31 ENCOUNTER — Encounter: Payer: Self-pay | Admitting: Intensive Care

## 2020-08-31 ENCOUNTER — Emergency Department
Admission: EM | Admit: 2020-08-31 | Discharge: 2020-08-31 | Disposition: A | Discharge status: Home / Self Care | Payer: Medicare HMO | Attending: Emergency Medicine | Admitting: Emergency Medicine

## 2020-08-31 DIAGNOSIS — E039 Hypothyroidism, unspecified: Secondary | ICD-10-CM | POA: Diagnosis not present

## 2020-08-31 DIAGNOSIS — I10 Essential (primary) hypertension: Secondary | ICD-10-CM | POA: Diagnosis not present

## 2020-08-31 DIAGNOSIS — L0201 Cutaneous abscess of face: Secondary | ICD-10-CM | POA: Insufficient documentation

## 2020-08-31 DIAGNOSIS — E119 Type 2 diabetes mellitus without complications: Secondary | ICD-10-CM | POA: Insufficient documentation

## 2020-08-31 DIAGNOSIS — Z79899 Other long term (current) drug therapy: Secondary | ICD-10-CM | POA: Insufficient documentation

## 2020-08-31 DIAGNOSIS — J45909 Unspecified asthma, uncomplicated: Secondary | ICD-10-CM | POA: Diagnosis not present

## 2020-08-31 DIAGNOSIS — Z96643 Presence of artificial hip joint, bilateral: Secondary | ICD-10-CM | POA: Diagnosis not present

## 2020-08-31 DIAGNOSIS — Z7984 Long term (current) use of oral hypoglycemic drugs: Secondary | ICD-10-CM | POA: Insufficient documentation

## 2020-08-31 LAB — BASIC METABOLIC PANEL
Anion gap: 9 (ref 5–15)
BUN: 17 mg/dL (ref 6–20)
CO2: 22 mmol/L (ref 22–32)
Calcium: 8.2 mg/dL — ABNORMAL LOW (ref 8.9–10.3)
Chloride: 104 mmol/L (ref 98–111)
Creatinine, Ser: 0.96 mg/dL (ref 0.44–1.00)
GFR, Estimated: >60 mL/min (ref >60)
Glucose, Bld: 147 mg/dL — ABNORMAL HIGH (ref 70–99)
Potassium: 4.5 mmol/L (ref 3.5–5.1)
Sodium: 135 mmol/L (ref 135–145)

## 2020-08-31 LAB — CBC WITH DIFFERENTIAL/PLATELET
Abs Immature Granulocytes: 0.03 10*3/uL (ref 0.00–0.07)
Basophils Absolute: 0.1 10*3/uL (ref 0.0–0.1)
Basophils Relative: 1 %
Eosinophils Absolute: 0.2 10*3/uL (ref 0.0–0.5)
Eosinophils Relative: 3 %
HCT: 38.4 % (ref 36.0–46.0)
Hemoglobin: 12.4 g/dL (ref 12.0–15.0)
Immature Granulocytes: 0 %
Lymphocytes Relative: 23 %
Lymphs Abs: 2.2 10*3/uL (ref 0.7–4.0)
MCH: 28.4 pg (ref 26.0–34.0)
MCHC: 32.3 g/dL (ref 30.0–36.0)
MCV: 88.1 fL (ref 80.0–100.0)
Monocytes Absolute: 0.7 10*3/uL (ref 0.1–1.0)
Monocytes Relative: 7 %
Neutro Abs: 6.6 10*3/uL (ref 1.7–7.7)
Neutrophils Relative %: 66 %
Platelets: 286 10*3/uL (ref 150–400)
RBC: 4.36 MIL/uL (ref 3.87–5.11)
RDW: 13.2 % (ref 11.5–15.5)
WBC: 9.7 10*3/uL (ref 4.0–10.5)
nRBC: 0 % (ref 0.0–0.2)

## 2020-08-31 MED ORDER — DOXYCYCLINE HYCLATE 100 MG PO CAPS: 100.0000 mg | ORAL_CAPSULE | Freq: Two times a day (BID) | ORAL | 0 refills | Status: DC

## 2020-08-31 MED ORDER — LIDOCAINE-EPINEPHRINE-TETRACAINE (LET) TOPICAL GEL
3.0000 mL | Freq: Once | TOPICAL | Status: AC
  Administered 2020-08-31: 15:00:00 3 mL via TOPICAL
  Filled 2020-08-31: qty 3

## 2020-08-31 MED ORDER — OXYCODONE-ACETAMINOPHEN 7.5-325 MG PO TABS
1.0000 | ORAL_TABLET | Freq: Once | ORAL | Status: AC
  Administered 2020-08-31: 13:00:00 1 via ORAL
  Filled 2020-08-31: qty 1

## 2020-08-31 NOTE — ED Triage Notes (Signed)
Patient has abscess present on left forehead near eyebrow. Redness noted. Raised area

## 2020-08-31 NOTE — Discharge Instructions (Addendum)
Follow-up with Dr. Doy Hutching if any continued problems.  Use warm moist compresses frequently to the left facial area.  Discontinue taking the clindamycin at this time.  Begin taking the doxycycline that was sent to your pharmacy.  If any severe worsening of your symptoms such as fever, chills or large increase in the size of the redness on your face return to the emergency department.

## 2020-08-31 NOTE — ED Provider Notes (Signed)
[facility] Emergency Department Provider Note   ____________________________________________   Event Date/Time   First MD Initiated Contact with Patient 08/31/20 1207     (approximate)  I have reviewed the triage vital signs and the nursing notes.   HISTORY  Chief Complaint Abscess   HPI Karen Lewis is a 49 y.o. female presents to the ED with complaint of abscess over her left eyebrow.  Patient states that she has been taking clindamycin and has a history of MRSA patient states that her PA gave her additional clindamycin to take at any time she breaks out on her face as she is culture positive in the past with MRSA and she took a 5-day course of clindamycin.  Patient states that she in the past has had multiple lesions on her face and mostly has taken clindamycin 4 days.  She denies any fever, chills, nausea or vomiting.  He also reports that she is cultured MRSA in her urine.       Past Medical History:  Diagnosis Date  . Anemia   . Anginal pain ([place])   . Anxiety   . Arthralgia of hip 07/29/2015  . Arthritis   . Arthritis, degenerative 07/29/2015  . Asthma   . Cephalalgia 07/25/2014  . Dependence on unknown drug ([place])    multiplt controlled drug dependence  . Depression   . Diabetes mellitus without complication ([place])   . Difficult intubation    per pt needs small tube (has had abrasions from tube in past)  . Dysrhythmia    PVC's  . Eczema   . Fibromyalgia   . Gastritis   . GERD (gastroesophageal reflux disease)   . Gonalgia 07/29/2015   Overview:  Overview:  The patient has had bilateral intra-articular Hyalgan injections done on 07/16/2014 and although she seems to do well with this type of therapy, apparently her insurance company does not want to pay for they Hyalgan. On 11/27/2014 the patient underwent a bilateral genicular nerve block with excellent results. On 01/28/2015 she had a right knee genicular radiofrequency ablatio  . Gout    . H/O cardiovascular disorder 03/10/2015  . H/O surgical procedure 12/05/2012   Overview:  LSG ([facility] - April 2013)    . H/O thyroid disease 03/10/2015  . Headache   . Herpes   . History of artificial joint 07/29/2015  . History of hiatal hernia   . Hypertension   . Hypomagnesemia   . Hypothyroidism   . IDA (iron deficiency anemia) 05/28/2019  . LBP (low back pain) 07/29/2015  . Neuromuscular disorder ([place])   . Obesity   . PCOS (polycystic ovarian syndrome)   . Primary osteoarthritis of both knees 07/29/2015  . Sleep apnea    not using a Cpap machine at this time - most recent test mild apnea does not qualify for test (not OSA)  . Thyroid nodule bilateral  . Umbilical hernia     Patient Active Problem List   Diagnosis Date Noted  . Pain and numbness of left upper extremity 03/13/2020  . Cervical radiculitis (Left) 03/13/2020  . Pharmacologic therapy 02/19/2020  . S/P THR (total hip replacement) (Right) (11/27/2019) 02/19/2020  . History of total hip replacement (Bilateral) 11/27/2019  . Lymphedema 08/20/2019  . Type II diabetes mellitus with complication ([place]) 16/06/9603  . Atypical chest pain 08/13/2019  . Heart palpitations 08/13/2019  . Leg swelling 07/15/2019  . Elevated LFTs 06/07/2019  . Edema of both legs 06/04/2019  . Hypocalcemia 06/04/2019  .  Adult failure to thrive syndrome 06/03/2019  . IDA (iron deficiency anemia) 05/28/2019  . Mycobacterium abscessus infection 02/22/2019  . Surgical wound, non healing 09/27/2018  . Wound infection after surgery 08/11/2018  . Panniculitis 07/05/2018  . Allergy to iodine 04/26/2018  . H/O allergy to shellfish 04/26/2018  . Encounter for dental examination 02/21/2018  . Spondylosis without myelopathy or radiculopathy, lumbosacral region 02/21/2018  . History of gastric bypass 02/15/2018  . Depression 01/19/2018  . Secondary Osteoarthritis of knee (Bilateral) (R>L) 01/02/2018  . Encounter for dental exam and cleaning w/o  abnormal findings 12/19/2017  . Post op infection 12/05/2017  . Osteoarthritis of hip (Right) 07/07/2017  . Osteoarthritis of shoulder (Left) 05/31/2017  . Upper extremity pain 04/21/2017  . Chronic shoulder pain (Left) 04/21/2017  . Chronic pain syndrome 12/02/2016  . Neurogenic pain 12/02/2016  . Chest pain with low risk of acute coronary syndrome 08/04/2016  . SOB (shortness of breath) on exertion 08/04/2016  . Dysphagia, unspecified 05/04/2016  . Gastroesophageal reflux disease without esophagitis 05/04/2016  . Vitamin D insufficiency 11/27/2015  . Nontoxic goiter 11/12/2015  . Other psychoactive substance dependence, uncomplicated ([place]) 67/34/1937  . Controlled drug dependence ([place]) 10/13/2015  . Lumbar spondylosis 09/18/2015  . Hypomagnesemia 08/27/2015  . Class 3 severe obesity due to excess calories with serious comorbidity and body mass index (BMI) of 60.0 to 69.9 in adult ([place]) 08/27/2015  . Long term current use of opiate analgesic 07/29/2015  . Long term prescription opiate use 07/29/2015  . Opiate use 07/29/2015  . Encounter for therapeutic drug level monitoring 07/29/2015  . Opiate dependence ([place]) 07/29/2015  . Chronic knee pain (Primary Area of Pain) (Bilateral) (R>L) 07/29/2015  . Chronic low back pain (Third area of Pain) (Bilateral) (R>L) 07/29/2015  . Lumbar facet syndrome (Bilateral) (R>L) 07/29/2015  . Osteoarthritis 07/29/2015  . Grade 1 (1.4 cm) Anterolisthesis of L4 over L5 07/29/2015  . Chronic hip pain (Secondary area of Pain) (Right) 07/29/2015  . S/P THR (total hip replacement) (Left) 07/29/2015  . History of methicillin resistant staphylococcus aureus (MRSA) 07/29/2015  . History of bariatric surgery 07/29/2015  . History of methicillin resistant Staphylococcus aureus infection 07/29/2015  . Eczema 05/21/2015  . Gout 05/21/2015  . Headache, migraine 05/21/2015  . Bilateral polycystic ovarian syndrome 05/21/2015  . Disease of thyroid gland 05/21/2015   . Major depressive disorder, single episode 05/21/2015  . Dermatitis 05/21/2015  . Obstructive apnea 04/30/2015  . History of asthma 03/10/2015  . Binge eating disorder 03/10/2015  . Fibromyalgia 03/10/2015  . Anxiety, generalized 03/10/2015  . Insomnia, persistent 03/10/2015  . Depression, major, recurrent, moderate ([place]) 03/10/2015  . Avitaminosis D 04/30/2014  . Hyperlipidemia 04/25/2014  . Hyperlipidemia due to type 2 diabetes mellitus ([place]) 04/25/2014  . Uncomplicated asthma 90/24/0973  . Asthma 03/19/2014  . Goiter, nontoxic, multinodular 10/09/2013  . Apnea, sleep 12/05/2012  . History of surgical procedure 12/05/2012  . Type 2 diabetes mellitus without complication, without long-term current use of insulin ([place]) 12/05/2012  . Hypertension associated with diabetes ([place]) 12/05/2012    Past Surgical History:  Procedure Laterality Date  . APPENDECTOMY    . CARPAL TUNNEL RELEASE Bilateral   . CHOLECYSTECTOMY    . DIAGNOSTIC LAPAROSCOPY    . HIATAL HERNIA REPAIR    . JOINT REPLACEMENT Bilateral hip  . LAPAROSCOPIC PARTIAL GASTRECTOMY    . left trigger finger    . peniculectomy N/A 07/05/2018  . ROUX-EN-Y GASTRIC BYPASS  06/03/2017  . SHOULDER ARTHROSCOPY  Right   . THYROIDECTOMY N/A 11/12/2015   Procedure: THYROIDECTOMY;  Surgeon: Clyde Canterbury, MD;  Location: [facility] ORS;  Service: ENT;  Laterality: N/A;  . TOTAL HIP ARTHROPLASTY Right 11/27/2019   Procedure: TOTAL HIP ARTHROPLASTY ANTERIOR APPROACH;  Surgeon: Hessie Knows, MD;  Location: [facility] ORS;  Service: Orthopedics;  Laterality: Right;  . TRIGGER FINGER RELEASE Right   . TRIGGER FINGER RELEASE Right 04/24/2020   Procedure: Right ring and middle trigger release;  Surgeon: Hessie Knows, MD;  Location: [facility] ORS;  Service: Orthopedics;  Laterality: Right;    Prior to Admission medications   Medication Sig Start Date End Date Taking? Authorizing Provider  ACCU-CHEK AVIVA PLUS test strip  03/13/19   [provider]   acetaminophen (TYLENOL) 650 MG CR tablet Take 650-1,300 mg by mouth every 8 (eight) hours as needed for pain.    [provider]  acidophilus (RISAQUAD) CAPS capsule Take 1 capsule by mouth daily.    [provider]  albuterol (PROVENTIL HFA;VENTOLIN HFA) 108 (90 BASE) MCG/ACT inhaler Inhale 2 puffs into the lungs every 6 (six) hours as needed for wheezing or shortness of breath.    [provider]  amitriptyline (ELAVIL) 100 MG tablet Take 100 mg by mouth at bedtime.    [provider]  Ascorbic Acid (VITAMIN C) 1000 MG tablet Take 1,000 mg by mouth daily.     [provider]  Biotin 10 MG CAPS Take 10 mg by mouth daily.     [provider]  butalbital-acetaminophen-caffeine (FIORICET) 50-325-40 MG tablet Take 1 tablet by mouth every 6 (six) hours as needed for migraine.  08/24/19   [provider]  calcium carbonate (TUMS EX) 750 MG chewable tablet Chew 1 tablet by mouth daily.     [provider]  clonazePAM (KLONOPIN) 0.5 MG tablet Take 1 tablet (0.5 mg total) by mouth 3 (three) times daily as needed for anxiety. 11/30/19   Duanne Guess, PA-C  Cyanocobalamin (B-12) 2500 MCG TABS Take 2,500 mcg by mouth daily.    [provider]  diclofenac sodium (VOLTAREN) 1 % GEL Apply 2 g topically 4 (four) times daily. Patient taking differently: Apply 2 g topically 3 (three) times daily as needed (pain).  05/29/19   Noemi Chapel, MD  diphenhydrAMINE (BENADRYL) 25 MG tablet Take 25 mg by mouth every 8 (eight) hours as needed for itching or allergies.     [provider]  doxycycline (VIBRAMYCIN) 100 MG capsule Take 1 capsule (100 mg total) by mouth 2 (two) times daily. 08/31/20   Johnn Hai, PA-C  EPINEPHrine 0.3 mg/0.3 mL IJ SOAJ injection Inject 0.3 mg into the muscle as needed for anaphylaxis.     [provider]  ergocalciferol (VITAMIN D2) 1.25 MG (50000 UT) capsule Take 50,000 Units by mouth 2  (two) times a week.  10/25/19   [provider]  famotidine (PEPCID) 20 MG tablet Take 20 mg by mouth at bedtime.     [provider]  glucose blood (ACCU-CHEK AVIVA PLUS) test strip Use 2 (two) times daily 09/29/18   [provider]  HYDROcodone-acetaminophen (NORCO) 7.5-325 MG tablet Take 1 tablet by mouth every 6 (six) hours as needed for moderate pain. 04/24/20   Hessie Knows, MD  levocetirizine (XYZAL) 5 MG tablet Take 5 mg by mouth at bedtime.  09/12/19   [provider]  levothyroxine (SYNTHROID) 125 MCG tablet Take 250 mcg by mouth daily before breakfast.  08/31/18 04/24/20  [provider]  lisinopril (ZESTRIL) 5 MG tablet Take 5 mg by mouth at bedtime.  12/11/18 04/24/20  [provider]  magnesium oxide (MAG-OX) 400 MG tablet Take 800-1,200 mg by mouth See admin instructions. Take 1200 mg by mouth in the morning and 800 mg at bedtime 10/24/19   [provider]  meclizine (ANTIVERT) 25 MG tablet Take 25 mg by mouth 3 (three) times daily as needed for dizziness.  09/19/19   [provider]  metFORMIN (GLUCOPHAGE) 1000 MG tablet Take 1,000 mg by mouth 2 (two) times daily with a meal.     [provider]  metoprolol tartrate (LOPRESSOR) 50 MG tablet Take 75 mg by mouth 2 (two) times daily.  03/16/19   [provider]  montelukast (SINGULAIR) 10 MG tablet Take 10 mg by mouth at bedtime.     [provider]  naloxone [facility]) nasal spray 4 mg/0.1 mL Spray into one nostril. Repeat with second device into other nostril after 2-3 minutes if no or minimal response. 02/08/19   Milinda Pointer, MD  naphazoline-pheniramine (NAPHCON-A) 0.025-0.3 % ophthalmic solution Place 1 drop into both eyes 4 (four) times daily as needed for eye irritation.    [provider]  nitrofurantoin, macrocrystal-monohydrate, (MACROBID) 100 MG capsule Take 1 capsule (100 mg total) by mouth daily. 08/11/20   Bjorn Loser,  MD  omeprazole (PRILOSEC) 40 MG capsule Take 40 mg by mouth in the morning and at bedtime.    [provider]  oxyCODONE (OXY IR/ROXICODONE) 5 MG immediate release tablet Take 1 tablet (5 mg total) by mouth 5 (five) times daily. Must last 30 days 07/09/20 08/08/20  Molli Barrows, MD  oxyCODONE (OXY IR/ROXICODONE) 5 MG immediate release tablet Take 1 tablet (5 mg total) by mouth 5 (five) times daily. Must last 30 days 08/08/20 09/07/20  Milinda Pointer, MD  oxyCODONE (OXY IR/ROXICODONE) 5 MG immediate release tablet Take 1 tablet (5 mg total) by mouth 5 (five) times daily. Must last 30 days 09/07/20 10/07/20  Milinda Pointer, MD  pregabalin (LYRICA) 225 MG capsule Take 1 capsule (225 mg total) by mouth 2 (two) times daily. 07/05/20 10/03/20  Milinda Pointer, MD  QUEtiapine (SEROQUEL) 100 MG tablet Take 100 mg by mouth at bedtime.    [provider]  rosuvastatin (CRESTOR) 40 MG tablet Take 40 mg by mouth daily.  02/28/19   [provider]  sertraline (ZOLOFT) 100 MG tablet Take 200 mg by mouth daily.  03/13/19   [provider]  spironolactone (ALDACTONE) 25 MG tablet Take 37.5 mg by mouth 2 (two) times daily.     [provider]  tiZANidine (ZANAFLEX) 4 MG tablet Take 1 tablet (4 mg total) by mouth 2 (two) times daily as needed for muscle spasms. 11/30/19   Duanne Guess, PA-C  topiramate (TOPAMAX) 50 MG tablet Take 50 mg by mouth 2 (two) times daily.    [provider]  triamcinolone (NASACORT AQ) 55 MCG/ACT AERO nasal inhaler Place 2 sprays into the nose 2 (two) times daily as needed (allergies).     [provider]  zolpidem (AMBIEN) 10 MG tablet Take 10 mg by mouth at bedtime as needed for sleep.    [provider]  zonisamide (ZONEGRAN) 50 MG capsule Take 150 mg by mouth at bedtime.     [provider]    Allergies Bactrim [sulfamethoxazole-trimethoprim], Omalizumab, Ciprofloxacin, Shellfish allergy, and  Nsaids  Family History  Problem Relation Age of Onset  .  Anxiety disorder Mother   . Depression Mother   . Alcohol abuse Mother   . Diabetes Mother   . Hypertension Mother   . Kidney cancer Mother   . Sleep apnea Mother   . Alcohol abuse Father   . Anxiety disorder Father   . Depression Father   . Post-traumatic stress disorder Father   . Kidney failure Father   . COPD Father   . Diabetes Father   . Hypertension Father   . Sleep apnea Father   . Depression Brother   . Diabetes Brother   . Hypertension Brother   . Sleep apnea Brother   . Breast cancer Paternal Aunt   . Bladder Cancer Neg Hx   . Prostate cancer Neg Hx     Social History Social History   Tobacco Use  . Smoking status: Never Smoker  . Smokeless tobacco: Never Used  Vaping Use  . Vaping Use: Never used  Substance Use Topics  . Alcohol use: No    Alcohol/week: 0.0 standard drinks  . Drug use: No    Review of Systems Constitutional: No fever/chills Eyes: No visual changes. Cardiovascular: Denies chest pain. Respiratory: Denies shortness of breath. Gastrointestinal:   No nausea, no vomiting.  Musculoskeletal: Negative for back pain. Skin: Erythematous lesion above left eyebrow. Neurological: Negative for headaches, focal weakness or numbness. ___________________________________________   PHYSICAL EXAM:  VITAL SIGNS: ED Triage Vitals [08/31/20 1154]  Enc Vitals Group     BP 132/77     Pulse Rate 91     Resp 16     Temp 98.8 F (37.1 C)     Temp Source Oral     SpO2 96 %     Weight (!) 371 lb (168.3 kg)     Height 5\' 4"  (1.626 m)     Head Circumference      Peak Flow      Pain Score 7     Pain Loc      Pain Edu?      Excl. in [place]?     Constitutional: Alert and oriented. Well appearing and in no acute distress. Eyes: Conjunctivae are normal.  Head: Atraumatic. Nose: No congestion/rhinnorhea. Neck: No stridor.   Cardiovascular: Normal rate, regular rhythm. Grossly normal heart  sounds.  Good peripheral circulation. Respiratory: Normal respiratory effort.  No retractions. Lungs CTAB. Neurologic:  Normal speech and language. No gross focal neurologic deficits are appreciated.  Skin:  Skin is warm, dry and intact.  There is a single erythematous nodule just above the left eyebrow without fluctuant area.  Area is tender to touch.  No active drainage present. Psychiatric: Mood and affect are normal. Speech and behavior are normal.  ____________________________________________   LABS (all labs ordered are listed, but only abnormal results are displayed)  Labs Reviewed  BASIC METABOLIC PANEL - Abnormal; Notable for the following components:      Result Value   Glucose, Bld 147 (*)    Calcium 8.2 (*)    All other components within normal limits  AEROBIC/ANAEROBIC CULTURE (SURGICAL/DEEP WOUND)  CBC WITH DIFFERENTIAL/PLATELET     PROCEDURES  Procedure(s) performed (including Critical Care):  Fine needle aspiration  Date/Time: 08/31/2020 3:53 PM Performed by: Johnn Hai, PA-C Authorized by: Johnn Hai, PA-C  Consent: Verbal consent obtained. Consent given by: patient Patient understanding: patient states understanding of the procedure being performed Patient identity confirmed: verbally with patient Local anesthesia used: yes  Anesthesia: Local anesthesia used: yes Local Anesthetic:  LET (lido, epi, tetracaine)  Sedation: Patient sedated: no  Patient tolerance: patient tolerated the procedure well with no immediate complications Comments: 16-XIHWT needle was used in this procedure.  A minimal amount of purulent material and blood was obtained.  Culture was sent to the lab per order.      ____________________________________________   INITIAL IMPRESSION / ASSESSMENT AND PLAN / ED COURSE  As part of my medical decision making, I reviewed the following data within the electronic MEDICAL RECORD NUMBER Notes from prior ED visits and [place]  Controlled Substance Database  49 year old female presents to the ED with complaint of single erythematous lesion above her left eyebrow.  Patient has an extensive history of MRSA and has been using clindamycin most recently twice in the recent past.  Patient is currently taking a 5-day course of clindamycin that was prescribed by her provider.  On exam there was no fluctuant area for I&D.  Patient requested that a puncture be made to obtain material for a aerobic and anaerobic culture.  She is to discontinue taking the clindamycin and a prescription for doxycycline was sent to her pharmacy.  Patient currently is contracted with the pain management clinic and states that she has pain medicine at home.  She is to follow-up with her PCP if any continued problems and also for possible referral as was indicated to her by her PCP on last visit.  ____________________________________________   FINAL CLINICAL IMPRESSION(S) / ED DIAGNOSES  Final diagnoses:  Cutaneous abscess of face     ED Discharge Orders         Ordered    doxycycline (VIBRAMYCIN) 100 MG capsule  2 times daily        08/31/20 1348          *Please note:  RUBLE BUTTLER was evaluated in Emergency Department on 08/31/2020 for the symptoms described in the history of present illness. She was evaluated in the context of the global COVID-19 pandemic, which necessitated consideration that the patient might be at risk for infection with the SARS-CoV-2 virus that causes COVID-19. Institutional protocols and algorithms that pertain to the evaluation of patients at risk for COVID-19 are in a state of rapid change based on information released by regulatory bodies including the CDC and federal and state organizations. These policies and algorithms were followed during the patient's care in the ED.  Some ED evaluations and interventions may be delayed as a result of limited staffing during and the pandemic.*   Note:  This document was prepared  using Dragon voice recognition software and may include unintentional dictation errors.    Johnn Hai, PA-C 08/31/20 1557    Duffy Bruce, MD 08/31/20 [phone]

## 2020-08-31 NOTE — ED Notes (Signed)
Patient is requesting to see provider again, as she is requesting culture of abscess. Advised that provider does not think that this Is necessary. Requesting to talk to provider.

## 2020-09-03 ENCOUNTER — Ambulatory Visit: Payer: Medicare HMO

## 2020-09-05 LAB — AEROBIC/ANAEROBIC CULTURE W GRAM STAIN (SURGICAL/DEEP WOUND)

## 2020-09-08 ENCOUNTER — Ambulatory Visit (INDEPENDENT_AMBULATORY_CARE_PROVIDER_SITE_OTHER): Payer: Medicare HMO | Admitting: Nurse Practitioner

## 2020-09-08 ENCOUNTER — Other Ambulatory Visit: Payer: Self-pay

## 2020-09-08 ENCOUNTER — Ambulatory Visit
Admission: RE | Admit: 2020-09-08 | Discharge: 2020-09-08 | Disposition: A | Discharge status: Home / Self Care | Payer: Medicare HMO | Source: Ambulatory Visit | Attending: Urology | Admitting: Urology

## 2020-09-08 ENCOUNTER — Encounter (INDEPENDENT_AMBULATORY_CARE_PROVIDER_SITE_OTHER): Payer: Self-pay | Admitting: Nurse Practitioner

## 2020-09-08 VITALS — BP 135/83 | HR 92 | Ht 64.0 in | Wt 373.0 lb

## 2020-09-08 DIAGNOSIS — N390 Urinary tract infection, site not specified: Secondary | ICD-10-CM | POA: Diagnosis not present

## 2020-09-08 DIAGNOSIS — I890 Lymphedema, not elsewhere classified: Secondary | ICD-10-CM

## 2020-09-08 DIAGNOSIS — E118 Type 2 diabetes mellitus with unspecified complications: Secondary | ICD-10-CM

## 2020-09-08 DIAGNOSIS — I152 Hypertension secondary to endocrine disorders: Secondary | ICD-10-CM

## 2020-09-08 DIAGNOSIS — E1159 Type 2 diabetes mellitus with other circulatory complications: Secondary | ICD-10-CM | POA: Diagnosis not present

## 2020-09-16 ENCOUNTER — Telehealth: Payer: Self-pay | Admitting: Pain Medicine

## 2020-09-16 NOTE — Telephone Encounter (Signed)
Patient lvmail asking to have medication scripts sent to [facility] as this will be cheaper for her.  [facility] [phone] [address], [address] [address]

## 2020-09-22 ENCOUNTER — Ambulatory Visit: Payer: Self-pay | Admitting: Urology

## 2020-09-22 ENCOUNTER — Telehealth: Payer: Self-pay | Admitting: *Deleted

## 2020-09-22 ENCOUNTER — Encounter (INDEPENDENT_AMBULATORY_CARE_PROVIDER_SITE_OTHER): Payer: Self-pay | Admitting: Nurse Practitioner

## 2020-09-22 NOTE — Telephone Encounter (Signed)
Spoke with patient and she is asking for her medications be sent to mail order pharmacy.  I told her there is nothing left to send at this point.  She has an appt on 10/01/20;  Patient does not think she has enough Lyrica to make it until that appt time.  I told her we could try and move her appt time up and then we can change pharmacy to the one of her preference.  Patient is agreeable to this.

## 2020-09-22 NOTE — Progress Notes (Signed)
Subjective:    Patient ID: Karen Lewis, female    DOB: May 16, 1971, 50 y.o.   MRN: [id number] Chief Complaint  Patient presents with  . Follow-up    6 mo no studies    The patient returns to the office for followup evaluation regarding leg swelling.  The swelling has persisted but with the lymph pump is much, much better controlled. The pain associated with swelling is essentially eliminated. There have not been any interval development of a ulcerations or wounds.  The patient denies problems with the pump, noting it is working well and the leggings are in good condition.  Since the previous visit the patient has been wearing graduated compression stockings and using the lymph pump on a routine basis and  has noted significant improvement in the lymphedema.   Patient stated the lymph pump has been a very positive factor in her care.    Review of Systems  Cardiovascular: Positive for leg swelling.  All other systems reviewed and are negative.      Objective:   Physical Exam Vitals reviewed.  Constitutional:      Appearance: She is obese.  HENT:     Head: Normocephalic.  Cardiovascular:     Rate and Rhythm: Normal rate.     Pulses: Normal pulses.  Pulmonary:     Effort: Pulmonary effort is normal.  Musculoskeletal:     Right lower leg: 1+ Edema present.     Left lower leg: 1+ Edema present.  Neurological:     Mental Status: She is alert and oriented to person, place, and time.     Gait: Gait abnormal.  Psychiatric:        Mood and Affect: Mood normal.        Behavior: Behavior normal.        Thought Content: Thought content normal.        Judgment: Judgment normal.     BP 135/83   Pulse 92   Ht '5\' 4"'  (1.626 m)   Wt (!) 373 lb (169.2 kg)   BMI 64.03 kg/m   Past Medical History:  Diagnosis Date  . Anemia   . Anginal pain ([place])   . Anxiety   . Arthralgia of hip 07/29/2015  . Arthritis   . Arthritis, degenerative 07/29/2015  . Asthma   . Cephalalgia  07/25/2014  . Dependence on unknown drug ([place])    multiplt controlled drug dependence  . Depression   . Diabetes mellitus without complication ([place])   . Difficult intubation    per pt needs small tube (has had abrasions from tube in past)  . Dysrhythmia    PVC's  . Eczema   . Fibromyalgia   . Gastritis   . GERD (gastroesophageal reflux disease)   . Gonalgia 07/29/2015   Overview:  Overview:  The patient has had bilateral intra-articular Hyalgan injections done on 07/16/2014 and although she seems to do well with this type of therapy, apparently her insurance company does not want to pay for they Hyalgan. On 11/27/2014 the patient underwent a bilateral genicular nerve block with excellent results. On 01/28/2015 she had a right knee genicular radiofrequency ablatio  . Gout   . H/O cardiovascular disorder 03/10/2015  . H/O surgical procedure 12/05/2012   Overview:  LSG ([facility] - April 2013)    . H/O thyroid disease 03/10/2015  . Headache   . Herpes   . History of artificial joint 07/29/2015  . History of hiatal hernia   .  Hypertension   . Hypomagnesemia   . Hypothyroidism   . IDA (iron deficiency anemia) 05/28/2019  . LBP (low back pain) 07/29/2015  . Neuromuscular disorder ([place])   . Obesity   . PCOS (polycystic ovarian syndrome)   . Primary osteoarthritis of both knees 07/29/2015  . Sleep apnea    not using a Cpap machine at this time - most recent test mild apnea does not qualify for test (not OSA)  . Thyroid nodule bilateral  . Umbilical hernia     Social History   Socioeconomic History  . Marital status: Significant Other    Spouse name: Not on file  . Number of children: 0  . Years of education: 53  . Highest education level: High school graduate  Occupational History  . Occupation: Disabled  Tobacco Use  . Smoking status: Never Smoker  . Smokeless tobacco: Never Used  Vaping Use  . Vaping Use: Never used  Substance and Sexual Activity  . Alcohol use: No     Alcohol/week: 0.0 standard drinks  . Drug use: No  . Sexual activity: Not Currently    Birth control/protection: Injection  Other Topics Concern  . Not on file  Social History Narrative  . Not on file   Social Determinants of Health   Financial Resource Strain: Low Risk   . Difficulty of Paying Living Expenses: Not hard at all  Food Insecurity: No Food Insecurity  . Worried About Charity fundraiser in the Last Year: Never true  . Ran Out of Food in the Last Year: Never true  Transportation Needs: No Transportation Needs  . Lack of Transportation (Medical): No  . Lack of Transportation (Non-Medical): No  Physical Activity: Inactive  . Days of Exercise per Week: 0 days  . Minutes of Exercise per Session: 0 min  Stress: Stress Concern Present  . Feeling of Stress : Rather much  Social Connections: Unknown  . Frequency of Communication with Friends and Family: More than three times a week  . Frequency of Social Gatherings with Friends and Family: More than three times a week  . Attends Religious Services: More than 4 times per year  . Active Member of Clubs or Organizations: No  . Attends Archivist Meetings: Never  . Marital Status: Not on file  Intimate Partner Violence: Not At Risk  . Fear of Current or Ex-Partner: No  . Emotionally Abused: No  . Physically Abused: No  . Sexually Abused: No    Past Surgical History:  Procedure Laterality Date  . APPENDECTOMY    . CARPAL TUNNEL RELEASE Bilateral   . CHOLECYSTECTOMY    . DIAGNOSTIC LAPAROSCOPY    . HIATAL HERNIA REPAIR    . JOINT REPLACEMENT Bilateral hip  . LAPAROSCOPIC PARTIAL GASTRECTOMY    . left trigger finger    . peniculectomy N/A 07/05/2018  . ROUX-EN-Y GASTRIC BYPASS  06/03/2017  . SHOULDER ARTHROSCOPY Right   . THYROIDECTOMY N/A 11/12/2015   Procedure: THYROIDECTOMY;  Surgeon: Clyde Canterbury, MD;  Location: [facility] ORS;  Service: ENT;  Laterality: N/A;  . TOTAL HIP ARTHROPLASTY Right 11/27/2019    Procedure: TOTAL HIP ARTHROPLASTY ANTERIOR APPROACH;  Surgeon: Hessie Knows, MD;  Location: [facility] ORS;  Service: Orthopedics;  Laterality: Right;  . TRIGGER FINGER RELEASE Right   . TRIGGER FINGER RELEASE Right 04/24/2020   Procedure: Right ring and middle trigger release;  Surgeon: Hessie Knows, MD;  Location: [facility] ORS;  Service: Orthopedics;  Laterality: Right;  Family History  Problem Relation Age of Onset  . Anxiety disorder Mother   . Depression Mother   . Alcohol abuse Mother   . Diabetes Mother   . Hypertension Mother   . Kidney cancer Mother   . Sleep apnea Mother   . Alcohol abuse Father   . Anxiety disorder Father   . Depression Father   . Post-traumatic stress disorder Father   . Kidney failure Father   . COPD Father   . Diabetes Father   . Hypertension Father   . Sleep apnea Father   . Depression Brother   . Diabetes Brother   . Hypertension Brother   . Sleep apnea Brother   . Breast cancer Paternal Aunt   . Bladder Cancer Neg Hx   . Prostate cancer Neg Hx     Allergies  Allergen Reactions  . Bactrim [Sulfamethoxazole-Trimethoprim] Hives  . Omalizumab Itching and Hives  . Ciprofloxacin Other (See Comments)    myalgia   . Shellfish Allergy Other (See Comments)    + positive allergy test  . Nsaids Nausea Only and Other (See Comments)    Stomach upset    CBC Latest Ref Rng & Units 08/31/2020 04/16/2020 12/11/2019  WBC 4.0 - 10.5 K/uL 9.7 12.4(H) 18.2(H)  Hemoglobin 12.0 - 15.0 g/dL 12.4 13.5 11.6(L)  Hematocrit 36.0 - 46.0 % 38.4 40.9 37.2  Platelets 150 - 400 K/uL 286 273 581(H)      CMP     Component Value Date/Time   NA 135 08/31/2020 1254   NA 139 06/17/2014 1811   K 4.5 08/31/2020 1254   K 3.6 06/17/2014 1811   CL 104 08/31/2020 1254   CL 111 (H) 06/17/2014 1811   CO2 22 08/31/2020 1254   CO2 22 06/17/2014 1811   GLUCOSE 147 (H) 08/31/2020 1254   GLUCOSE 99 06/17/2014 1811   BUN 17 08/31/2020 1254   BUN 7 06/17/2014 1811   CREATININE  0.96 08/31/2020 1254   CREATININE 0.98 06/17/2014 1811   CALCIUM 8.2 (L) 08/31/2020 1254   CALCIUM 8.5 06/17/2014 1811   PROT 7.5 04/16/2020 1340   PROT 7.4 06/17/2014 1811   ALBUMIN 4.3 04/16/2020 1340   ALBUMIN 3.5 06/17/2014 1811   AST 54 (H) 04/16/2020 1340   AST 23 06/17/2014 1811   ALT 51 (H) 04/16/2020 1340   ALT 22 06/17/2014 1811   ALKPHOS 75 04/16/2020 1340   ALKPHOS 90 06/17/2014 1811   BILITOT 0.6 04/16/2020 1340   BILITOT 0.2 06/17/2014 1811   GFRNONAA >60 08/31/2020 1254   GFRNONAA >60 06/17/2014 1811   GFRNONAA >60 12/13/2013 1628   GFRAA >60 04/16/2020 1340   GFRAA >60 06/17/2014 1811   GFRAA >60 12/13/2013 1628     No results found.     Assessment & Plan:   1. Lymphedema  No surgery or intervention at this point in time.    I have reviewed my discussion with the patient regarding lymphedema and why it  causes symptoms.  Patient will continue wearing graduated compression stockings class 1 (20-30 mmHg) on a daily basis a prescription was given. The patient is reminded to put the stockings on first thing in the morning and removing them in the evening. The patient is instructed specifically not to sleep in the stockings.   In addition, behavioral modification throughout the day will be continued.  This will include frequent elevation (such as in a recliner), use of over the counter pain medications as needed and exercise  such as walking.  I have reviewed systemic causes for chronic edema such as liver, kidney and cardiac etiologies and there does not appear to be any significant changes in these organ systems over the past year.  The patient is under the impression that these organ systems are all stable and unchanged.    The patient will continue aggressive use of the  lymph pump.  This will continue to improve the edema control and prevent sequela such as ulcers and infections.   The patient will follow-up with me on an annual basis.    2. Type II diabetes  mellitus with complication ([place]) Continue hypoglycemic medications as already ordered, these medications have been reviewed and there are no changes at this time.  Hgb A1C to be monitored as already arranged by primary service   3. Hypertension associated with diabetes ([place]) Continue antihypertensive medications as already ordered, these medications have been reviewed and there are no changes at this time.    Current Outpatient Medications on File Prior to Visit  Medication Sig Dispense Refill  . ACCU-CHEK AVIVA PLUS test strip     . acetaminophen (TYLENOL) 650 MG CR tablet Take 650-1,300 mg by mouth every 8 (eight) hours as needed for pain.    Marland Kitchen acidophilus (RISAQUAD) CAPS capsule Take 1 capsule by mouth daily.    Marland Kitchen albuterol (PROVENTIL HFA;VENTOLIN HFA) 108 (90 BASE) MCG/ACT inhaler Inhale 2 puffs into the lungs every 6 (six) hours as needed for wheezing or shortness of breath.    Marland Kitchen amitriptyline (ELAVIL) 100 MG tablet Take 100 mg by mouth at bedtime.    . Ascorbic Acid (VITAMIN C) 1000 MG tablet Take 1,000 mg by mouth daily.     . Biotin 10 MG CAPS Take 10 mg by mouth daily.     Marland Kitchen BREO ELLIPTA 100-25 MCG/INH AEPB Inhale 1 puff into the lungs daily.    . butalbital-acetaminophen-caffeine (FIORICET) 50-325-40 MG tablet Take 1 tablet by mouth every 6 (six) hours as needed for migraine.     . calcium carbonate (TUMS EX) 750 MG chewable tablet Chew 1 tablet by mouth daily.     . clonazePAM (KLONOPIN) 0.5 MG tablet Take 1 tablet (0.5 mg total) by mouth 3 (three) times daily as needed for anxiety. 30 tablet 0  . Cyanocobalamin (B-12) 2500 MCG TABS Take 2,500 mcg by mouth daily.    . diclofenac sodium (VOLTAREN) 1 % GEL Apply 2 g topically 4 (four) times daily. (Patient taking differently: Apply 2 g topically 3 (three) times daily as needed (pain).) 100 g 1  . diphenhydrAMINE (BENADRYL) 25 MG tablet Take 25 mg by mouth every 8 (eight) hours as needed for itching or allergies.     Marland Kitchen doxycycline  (VIBRAMYCIN) 100 MG capsule Take 1 capsule (100 mg total) by mouth 2 (two) times daily. 14 capsule 0  . EMGALITY 120 MG/ML SOAJ Inject into the skin.    Marland Kitchen EPINEPHrine 0.3 mg/0.3 mL IJ SOAJ injection Inject 0.3 mg into the muscle as needed for anaphylaxis.     Marland Kitchen ergocalciferol (VITAMIN D2) 1.25 MG (50000 UT) capsule Take 50,000 Units by mouth 2 (two) times a week.     . famotidine (PEPCID) 20 MG tablet Take 20 mg by mouth at bedtime.    . fluconazole (DIFLUCAN) 100 MG tablet Take 100 mg by mouth daily.    . furosemide (LASIX) 20 MG tablet Take 20 mg by mouth daily.    Marland Kitchen glucose blood (ACCU-CHEK AVIVA PLUS) test  strip Use 2 (two) times daily    . HYDROcodone-acetaminophen (NORCO) 7.5-325 MG tablet Take 1 tablet by mouth every 6 (six) hours as needed for moderate pain. 30 tablet 0  . levocetirizine (XYZAL) 5 MG tablet Take 5 mg by mouth at bedtime.     . magnesium oxide (MAG-OX) 400 (241.3 Mg) MG tablet SMARTSIG:1 By Mouth 4-5 Times Daily    . magnesium oxide (MAG-OX) 400 MG tablet Take 800-1,200 mg by mouth See admin instructions. Take 1200 mg by mouth in the morning and 800 mg at bedtime    . meclizine (ANTIVERT) 25 MG tablet Take 25 mg by mouth 3 (three) times daily as needed for dizziness.     . metFORMIN (GLUCOPHAGE) 1000 MG tablet Take 1,000 mg by mouth 2 (two) times daily with a meal.     . metoprolol tartrate (LOPRESSOR) 50 MG tablet Take 75 mg by mouth 2 (two) times daily.     . montelukast (SINGULAIR) 10 MG tablet Take 10 mg by mouth at bedtime.     . Multiple Vitamin (TAB-A-VITE) TABS     . naloxone (NARCAN) nasal spray 4 mg/0.1 mL Spray into one nostril. Repeat with second device into other nostril after 2-3 minutes if no or minimal response. 1 kit 0  . naphazoline-pheniramine (NAPHCON-A) 0.025-0.3 % ophthalmic solution Place 1 drop into both eyes 4 (four) times daily as needed for eye irritation.    . naratriptan (AMERGE) 2.5 MG tablet Take by mouth.    . nitrofurantoin,  macrocrystal-monohydrate, (MACROBID) 100 MG capsule Take 1 capsule (100 mg total) by mouth daily. 30 capsule 11  . omeprazole (PRILOSEC) 40 MG capsule Take 40 mg by mouth in the morning and at bedtime.    Marland Kitchen oxyCODONE (OXY IR/ROXICODONE) 5 MG immediate release tablet Take 1 tablet (5 mg total) by mouth 5 (five) times daily. Must last 30 days 150 tablet 0  . OZEMPIC, 0.25 OR 0.5 MG/DOSE, 2 MG/1.5ML SOPN Inject into the skin.    . pregabalin (LYRICA) 225 MG capsule Take 1 capsule (225 mg total) by mouth 2 (two) times daily. 180 capsule 0  . QUEtiapine (SEROQUEL) 100 MG tablet Take 100 mg by mouth at bedtime.    . rizatriptan (MAXALT) 10 MG tablet TAKE 1 TABLET AT ONSET OF MIGRAINE; MAY REPEAT ONCE AFTER 2 HOURS IF NEEDED.    . rizatriptan (MAXALT-MLT) 10 MG disintegrating tablet Take by mouth.    . rosuvastatin (CRESTOR) 40 MG tablet Take 40 mg by mouth daily.     . sertraline (ZOLOFT) 100 MG tablet Take 200 mg by mouth daily.     Marland Kitchen spironolactone (ALDACTONE) 25 MG tablet Take 37.5 mg by mouth 2 (two) times daily.     Marland Kitchen tiZANidine (ZANAFLEX) 4 MG tablet Take 1 tablet (4 mg total) by mouth 2 (two) times daily as needed for muscle spasms. 30 tablet 0  . topiramate (TOPAMAX) 100 MG tablet Take by mouth.    . topiramate (TOPAMAX) 50 MG tablet Take 50 mg by mouth 2 (two) times daily.    Marland Kitchen triamcinolone (NASACORT) 55 MCG/ACT AERO nasal inhaler Place 2 sprays into the nose 2 (two) times daily as needed (allergies).     . zolpidem (AMBIEN) 10 MG tablet Take 10 mg by mouth at bedtime as needed for sleep.    Marland Kitchen zonisamide (ZONEGRAN) 50 MG capsule Take 150 mg by mouth at bedtime.     Marland Kitchen levothyroxine (SYNTHROID) 125 MCG tablet Take 250 mcg by mouth  daily before breakfast.     . lisinopril (ZESTRIL) 5 MG tablet Take 5 mg by mouth at bedtime.     Marland Kitchen oxyCODONE (OXY IR/ROXICODONE) 5 MG immediate release tablet Take 1 tablet (5 mg total) by mouth 5 (five) times daily. Must last 30 days 150 tablet 0  . oxyCODONE (OXY  IR/ROXICODONE) 5 MG immediate release tablet Take 1 tablet (5 mg total) by mouth 5 (five) times daily. Must last 30 days 150 tablet 0   No current facility-administered medications on file prior to visit.    There are no Patient Instructions on file for this visit. No follow-ups on file.   Kris Hartmann, NP

## 2020-09-25 ENCOUNTER — Encounter: Payer: Self-pay | Admitting: Pain Medicine

## 2020-09-25 ENCOUNTER — Ambulatory Visit: Payer: Medicare HMO | Attending: Pain Medicine | Admitting: Pain Medicine

## 2020-09-25 ENCOUNTER — Other Ambulatory Visit: Payer: Self-pay

## 2020-09-25 VITALS — BP 126/73 | HR 91 | Temp 96.7°F | Resp 16 | Ht 64.0 in | Wt 366.0 lb

## 2020-09-25 DIAGNOSIS — M545 Low back pain, unspecified: Secondary | ICD-10-CM | POA: Diagnosis not present

## 2020-09-25 DIAGNOSIS — F1120 Opioid dependence, uncomplicated: Secondary | ICD-10-CM | POA: Diagnosis not present

## 2020-09-25 DIAGNOSIS — M25562 Pain in left knee: Secondary | ICD-10-CM | POA: Diagnosis not present

## 2020-09-25 DIAGNOSIS — M797 Fibromyalgia: Secondary | ICD-10-CM | POA: Insufficient documentation

## 2020-09-25 DIAGNOSIS — M25551 Pain in right hip: Secondary | ICD-10-CM | POA: Diagnosis not present

## 2020-09-25 DIAGNOSIS — Z6841 Body Mass Index (BMI) 40.0 and over, adult: Secondary | ICD-10-CM | POA: Diagnosis present

## 2020-09-25 DIAGNOSIS — M47816 Spondylosis without myelopathy or radiculopathy, lumbar region: Secondary | ICD-10-CM | POA: Insufficient documentation

## 2020-09-25 DIAGNOSIS — G894 Chronic pain syndrome: Secondary | ICD-10-CM | POA: Diagnosis not present

## 2020-09-25 DIAGNOSIS — G8929 Other chronic pain: Secondary | ICD-10-CM | POA: Insufficient documentation

## 2020-09-25 DIAGNOSIS — M25561 Pain in right knee: Secondary | ICD-10-CM

## 2020-09-25 DIAGNOSIS — M792 Neuralgia and neuritis, unspecified: Secondary | ICD-10-CM | POA: Diagnosis not present

## 2020-09-25 DIAGNOSIS — Z79899 Other long term (current) drug therapy: Secondary | ICD-10-CM | POA: Insufficient documentation

## 2020-09-25 MED ORDER — PREGABALIN 225 MG PO CAPS: 225.0000 mg | ORAL_CAPSULE | Freq: Two times a day (BID) | ORAL | 0 refills | Status: DC

## 2020-09-25 MED ORDER — OXYCODONE HCL 5 MG PO TABS: 5.0000 mg | ORAL_TABLET | Freq: Two times a day (BID) | ORAL | 0 refills | Status: DC

## 2020-09-25 NOTE — Progress Notes (Signed)
Nursing Pain Medication Assessment:  Safety precautions to be maintained throughout the outpatient stay will include: orient to surroundings, keep bed in low position, maintain call bell within reach at all times, provide assistance with transfer out of bed and ambulation.  Medication Inspection Compliance: Pill count conducted under aseptic conditions, in front of the patient. Neither the pills nor the bottle was removed from the patient's sight at any time. Once count was completed pills were immediately returned to the patient in their original bottle.  Medication: Oxycodone IR Pill/Patch Count: 82.5 of 150 pills remain Pill/Patch Appearance: Markings consistent with prescribed medication Bottle Appearance: Standard pharmacy container. Clearly labeled. Filled Date:08/16/2020 Last Medication intake:  Today

## 2020-09-25 NOTE — Progress Notes (Signed)
PROVIDER NOTE: Information contained herein reflects review and annotations entered in association with encounter. Interpretation of such information and data should be left to medically-trained personnel. Information provided to patient can be located elsewhere in the medical record under "Patient Instructions". Document created using STT-dictation technology, any transcriptional errors that may result from process are unintentional.    Patient: Karen Lewis  Service Category: E/M  Provider: Gaspar Cola, MD  DOB: [birth year]  DOS: 09/25/2020  Specialty: Interventional Pain Management  MRN: [id number]  Setting: Ambulatory outpatient  PCP: Idelle Crouch, MD  Type: Established Patient    Referring Provider: Idelle Crouch, MD  Location: Office  Delivery: Face-to-face     HPI  [name], a 50 y.o. year old female, is here today because of her Chronic pain syndrome [G89.4]. Karen Lewis primary complain today is Knee Pain (bilateral) and Migraine Last encounter: My last encounter with her was on 09/16/2020. Pertinent problems: Karen Lewis has Fibromyalgia; Gout; Chronic knee pain (1ry area of Pain) (Bilateral) (R>L); Chronic low back pain (3ry area of Pain) (Bilateral) (R>L); Lumbar facet syndrome (Bilateral) (R>L); Osteoarthritis; Grade 1 (1.4 cm) Anterolisthesis of L4 over L5; Chronic hip pain (2ry area of Pain) (Right); S/P THR (total hip replacement) (Left); Lumbar spondylosis; Chronic pain syndrome; Neurogenic pain; Upper extremity pain; Chronic shoulder pain (Left); Osteoarthritis of shoulder (Left); Osteoarthritis of hip (Right); Secondary Osteoarthritis of knee (Bilateral) (R>L); Spondylosis without myelopathy or radiculopathy, lumbosacral region; Panniculitis; Leg swelling; Edema of both legs; History of total hip replacement (Bilateral); S/P THR (total hip replacement) (Right) (11/27/2019); Pain and numbness of left upper extremity; and Cervical radiculitis (Left) on their pertinent problem  list. Pain Assessment: Severity of Chronic pain is reported as a 5 /10. Location: Knee Right,Left/numbness at front of upper leg. Onset: More than a month ago. Quality: Burning,Aching. Timing: Constant. Modifying factor(s): repositioning, medications, Voltaren gel, rest. Vitals:  height is '5\' 4"'  (1.626 m) and weight is 366 lb (166 kg) (abnormal). Her temporal temperature is 96.7 F (35.9 C) (abnormal). Her blood pressure is 126/73 and her pulse is 91. Her respiration is 16 and oxygen saturation is 98%.   Reason for encounter: medication management. The patient indicates doing well with the current medication regimen. No adverse reactions or side effects reported to the medications. PMP & UDS compliant. PMP reveals that although on 06/24/2020 I provided the patient with 2 prescriptions for oxycodone IR 5 mg # 150, she has only had 1 of those 2 prescriptions failed on 08/16/2020.  In fact, before that 1, the last 1 that she had failed was on 06/12/2020.  This would mean that she used 150 tablets in it period of 65 days or an average of 2.3 tablets/day, instead of 5/day.  If this holds true, then the prescription that she had filled done 08/16/2020 should last until 10/20/2020 and the second prescription that she obtained on 06/24/2020 which she has not filled should provide her with enough medication to last until 12/24/2020.   Today the patient has confirmed that since she had the hip replacement, her pain has gone down significantly and she is only using an average of 2 pills/day. Because she has been using an average of 2 pills/day, the next prescription will reflect this and she should be able to get it filled on 12/24/2020 to last until 01/23/2021.  In addition the patient indicates that recently her back pain has been getting worse and she would like to go ahead and  have the lumbar facets repeated.  We will go ahead and make arrangements for that today.  RTCB: 01/23/2021 Nonopioid transferred 06/24/2020:  Lyrica  Pharmacotherapy Assessment   Analgesic: Oxycodone IR 5 mg 1 tab PO 5X/day (#150/mo) (25 mg/day) MME/day:37.5 mg/day.   Monitoring: [place] PMP: PDMP not reviewed this encounter.       Pharmacotherapy: No side-effects or adverse reactions reported. Compliance: No problems identified. Effectiveness: Clinically acceptable.  Karen Martins, RN  09/25/2020  2:14 PM  Sign when Signing Visit Nursing Pain Medication Assessment:  Safety precautions to be maintained throughout the outpatient stay will include: orient to surroundings, keep bed in low position, maintain call bell within reach at all times, provide assistance with transfer out of bed and ambulation.  Medication Inspection Compliance: Pill count conducted under aseptic conditions, in front of the patient. Neither the pills nor the bottle was removed from the patient's sight at any time. Once count was completed pills were immediately returned to the patient in their original bottle.  Medication: Oxycodone IR Pill/Patch Count: 82.5 of 150 pills remain Pill/Patch Appearance: Markings consistent with prescribed medication Bottle Appearance: Standard pharmacy container. Clearly labeled. Filled Date:08/16/2020 Last Medication intake:  Today    UDS:  Summary  Date Value Ref Range Status  02/19/2020 Note  Final    Comment:    ==================================================================== [employer] [facility] 13 (MW) ==================================================================== Test                             Result       Flag       Units  Drug Present and Declared for Prescription Verification   7-aminoclonazepam              44           EXPECTED   ng/mg creat    7-aminoclonazepam is an expected metabolite of clonazepam. Source of    clonazepam is a scheduled prescription medication.  Drug Present not Declared for Prescription Verification   Oxycodone                      199          UNEXPECTED ng/mg creat    Oxymorphone                    127          UNEXPECTED ng/mg creat   Noroxycodone                   1026         UNEXPECTED ng/mg creat   Noroxymorphone                 56           UNEXPECTED ng/mg creat    Sources of oxycodone are scheduled prescription medications.    Oxymorphone, noroxycodone, and noroxymorphone are expected    metabolites of oxycodone. Oxymorphone is also available as a    scheduled prescription medication.  Drug Absent but Declared for Prescription Verification   Butalbital                     Not Detected UNEXPECTED ==================================================================== Test                      Result    Flag   Units      Ref Range   Creatinine  165              mg/dL      >=20 ==================================================================== Declared Medications:  The flagging and interpretation on this report are based on the  following declared medications.  Unexpected results may arise from  inaccuracies in the declared medications.   **Note: The testing scope of this panel includes these medications:   Butalbital (Fioricet)  Clonazepam (Klonopin)   **Note: The testing scope of this panel does not include the  following reported medications:   Acetaminophen (Tylenol)  Acetaminophen (Fioricet)  Albuterol (Ventolin HFA)  Amitriptyline (Elavil)  Biotin  Caffeine (Fioricet)  Calcium (Tums)  Diphenhydramine (Benadryl)  Epinephrine (EpiPen)  Eye Drops  Famotidine (Pepcid)  Levocetirizine (Xyzal)  Levothyroxine (Synthroid)  Lisinopril (Zestril)  Magnesium (Mag-Ox)  Meclizine (Antivert)  Medroxyprogesterone (Depo-Provera)  Metformin (Glucophage)  Metoprolol (Lopressor)  Montelukast (Singulair)  Multivitamin  Naloxone (Narcan)  Pregabalin (Lyrica)  Probiotic  Quetiapine (Seroquel)  Rosuvastatin (Crestor)  Sertraline (Zoloft)  Spironolactone (Aldactone)  Tizanidine (Zanaflex)  Topical  Topical Diclofenac   Triamcinolone (Nasacort)  Vitamin B  Vitamin B12  Vitamin C  Vitamin D2  Zolpidem (Ambien)  Zonisamide (Zonegran) ==================================================================== For clinical consultation, please call [phone]. ====================================================================      ROS  Constitutional: Denies any fever or chills Gastrointestinal: No reported hemesis, hematochezia, vomiting, or acute GI distress Musculoskeletal: Denies any acute onset joint swelling, redness, loss of ROM, or weakness Neurological: No reported episodes of acute onset apraxia, aphasia, dysarthria, agnosia, amnesia, paralysis, loss of coordination, or loss of consciousness  Medication Review  B-12, Biotin, EPINEPHrine, Galcanezumab-gnlm, HYDROcodone-acetaminophen, QUEtiapine, Semaglutide(0.25 or 0.5MG/DOS), Tab-A-Vite, acetaminophen, acidophilus, albuterol, amitriptyline, butalbital-acetaminophen-caffeine, calcium carbonate, clonazePAM, diclofenac sodium, diphenhydrAMINE, doxycycline, ergocalciferol, famotidine, fluconazole, fluticasone furoate-vilanterol, furosemide, glucose blood, levocetirizine, levothyroxine, lisinopril, magnesium oxide, meclizine, metFORMIN, metoprolol tartrate, montelukast, naloxone, naphazoline-pheniramine, naratriptan, nitrofurantoin (macrocrystal-monohydrate), omeprazole, oxyCODONE, pregabalin, rizatriptan, rosuvastatin, sertraline, spironolactone, tiZANidine, topiramate, triamcinolone, vitamin C, zolpidem, and zonisamide  History Review  Allergy: Ms. Tomkiewicz is allergic to bactrim [sulfamethoxazole-trimethoprim], omalizumab, ciprofloxacin, shellfish allergy, and nsaids. Drug: Ms. Macfarlane  reports no history of drug use. Alcohol:  reports no history of alcohol use. Tobacco:  reports that she has never smoked. She has never used smokeless tobacco. Social: Ms. Laing  reports that she has never smoked. She has never used smokeless tobacco. She reports that she does  not drink alcohol and does not use drugs. Medical:  has a past medical history of Anemia, Anginal pain ([place]), Anxiety, Arthralgia of hip (07/29/2015), Arthritis, Arthritis, degenerative (07/29/2015), Asthma, Cephalalgia (07/25/2014), Dependence on unknown drug ([place]), Depression, Diabetes mellitus without complication ([place]), Difficult intubation, Dysrhythmia, Eczema, Fibromyalgia, Gastritis, GERD (gastroesophageal reflux disease), Gonalgia (07/29/2015), Gout, H/O cardiovascular disorder (03/10/2015), H/O surgical procedure (12/05/2012), H/O thyroid disease (03/10/2015), Headache, Herpes, History of artificial joint (07/29/2015), History of hiatal hernia, Hypertension, Hypomagnesemia, Hypothyroidism, IDA (iron deficiency anemia) (05/28/2019), LBP (low back pain) (07/29/2015), Neuromuscular disorder ([place]), Obesity, PCOS (polycystic ovarian syndrome), Primary osteoarthritis of both knees (07/29/2015), Sleep apnea, Thyroid nodule (bilateral), and Umbilical hernia. Surgical: Ms. Nowotny  has a past surgical history that includes Laparoscopic partial gastrectomy; Shoulder arthroscopy (Right); Carpal tunnel release (Bilateral); Diagnostic laparoscopy; Cholecystectomy; Trigger finger release (Right); Thyroidectomy (N/A, 11/12/2015); left trigger finger; Roux-en-Y Gastric Bypass (06/03/2017); Hiatal hernia repair; peniculectomy (N/A, 07/05/2018); Total hip arthroplasty (Right, 11/27/2019); Joint replacement (Bilateral, hip); Appendectomy; and Trigger finger release (Right, 04/24/2020). Family: family history includes Alcohol abuse in her father and mother; Anxiety disorder in her father and mother; Breast cancer in her paternal aunt;  COPD in her father; Depression in her brother, father, and mother; Diabetes in her brother, father, and mother; Hypertension in her brother, father, and mother; Kidney cancer in her mother; Kidney failure in her father; Post-traumatic stress disorder in her father; Sleep apnea in her brother, father, and  mother.  Laboratory Chemistry Profile   Renal Lab Results  Component Value Date   BUN 17 08/31/2020   CREATININE 0.96 08/31/2020   GFRAA >60 04/16/2020   GFRNONAA >60 08/31/2020     Hepatic Lab Results  Component Value Date   AST 54 (H) 04/16/2020   ALT 51 (H) 04/16/2020   ALBUMIN 4.3 04/16/2020   ALKPHOS 75 04/16/2020     Electrolytes Lab Results  Component Value Date   NA 135 08/31/2020   K 4.5 08/31/2020   CL 104 08/31/2020   CALCIUM 8.2 (L) 08/31/2020   MG 1.7 12/11/2019   PHOS 5.5 (H) 05/22/2019     Bone Lab Results  Component Value Date   VD25OH 27.4 (L) 11/24/2015   VD125OH2TOT 41.5 11/24/2015     Inflammation (CRP: Acute Phase) (ESR: Chronic Phase) Lab Results  Component Value Date   CRP 0.7 09/27/2019   ESRSEDRATE 30 (H) 09/27/2019       Note: Above Lab results reviewed.  Recent Imaging Review  US RENAL CLINICAL DATA:  Urinary tract infection  EXAM: RENAL / URINARY TRACT ULTRASOUND COMPLETE  COMPARISON:  05/02/2018  FINDINGS: Right Kidney:  Renal measurements: 11.1 x 4.9 x 4.9 cm = volume: 137.2 mL. Echogenicity within normal limits. No mass or hydronephrosis visualized.  Left Kidney:  Renal measurements: 13.1 x 5.7 x 3.9 cm = volume: 151.8 mL. Echogenicity within normal limits. No mass or hydronephrosis visualized.  Bladder:  Appears normal for degree of bladder distention.  Other:  None.  IMPRESSION: 1. Unremarkable renal ultrasound.  Electronically Signed   By: Randa Ngo M.D.   On: 09/08/2020 16:02 Note: Reviewed        Physical Exam  General appearance: Well nourished, well developed, and well hydrated. In no apparent acute distress Mental status: Alert, oriented x 3 (person, place, & time)       Respiratory: No evidence of acute respiratory distress Eyes: PERLA Vitals: BP 126/73   Pulse 91   Temp (!) 96.7 F (35.9 C) (Temporal)   Resp 16   Ht '5\' 4"'  (1.626 m)   Wt (!) 366 lb (166 kg)   SpO2 98%   BMI  62.82 kg/m  BMI: Estimated body mass index is 62.82 kg/m as calculated from the following:   Height as of this encounter: '5\' 4"'  (1.626 m).   Weight as of this encounter: 366 lb (166 kg). Ideal: Ideal body weight: 54.7 kg (120 lb 9.5 oz) Adjusted ideal body weight: 99.2 kg (218 lb 12.1 oz)  Assessment   Status Diagnosis  Controlled Controlled Controlled 1. Chronic pain syndrome   2. Chronic knee pain (1ry area of Pain) (Bilateral) (R>L)   3. Chronic hip pain (2ry area of Pain) (Right)   4. Chronic low back pain (3ry area of Pain) (Bilateral) (R>L)   5. Class 3 severe obesity due to excess calories with serious comorbidity and body mass index (BMI) of 60.0 to 69.9 in adult ([place])   6. Pharmacologic therapy   7. Uncomplicated opioid dependence ([place])   8. Fibromyalgia   9. Neurogenic pain   10. Lumbar facet syndrome (Bilateral) (R>L)      Updated Problems: Problem  Chronic knee pain (  1ry area of Pain) (Bilateral) (R>L)   The patient has had bilateral intra-articular Hyalgan injections done on 07/16/2014 and although she seems to do well with this type of therapy, apparently her insurance company does not want to pay for they Hyalgan. On 11/27/2014 the patient underwent a bilateral genicular nerve block with excellent results. On 01/28/2015 she had a right knee genicular radiofrequency ablation this was subsequently followed by a similar treatment on the left knee. Unfortunately, she returns indicating that his this has worn off.   Chronic low back pain (3ry area of Pain) (Bilateral) (R>L)  Chronic hip pain (2ry area of Pain) (Right)  Uncomplicated Opioid Dependence ([facility])    Plan of Care  Problem-specific:  No problem-specific Assessment & Plan notes found for this encounter.  Ms. LILLYBETH TAL has a current medication list which includes the following long-term medication(s): albuterol, clonazepam, diphenhydramine, furosemide, magnesium oxide, metformin, metoprolol tartrate,  montelukast, naloxone, naratriptan, rizatriptan, rizatriptan, spironolactone, triamcinolone, zonisamide, levothyroxine, lisinopril, [START ON 12/24/2020] oxycodone, [START ON 10/02/2020] pregabalin, and pregabalin.  Pharmacotherapy (Medications Ordered): Meds ordered this encounter  Medications  . oxyCODONE (OXY IR/ROXICODONE) 5 MG immediate release tablet    Sig: Take 1 tablet (5 mg total) by mouth 2 (two) times daily. Must last 30 days    Dispense:  60 tablet    Refill:  0    Chronic Pain: STOP Act (Not applicable) Fill 1 day early if closed on refill date. Avoid benzodiazepines within 8 hours of opioids  . pregabalin (LYRICA) 225 MG capsule    Sig: Take 1 capsule (225 mg total) by mouth 2 (two) times daily.    Dispense:  180 capsule    Refill:  0    Fill one day early if pharmacy is closed on scheduled refill date. May substitute for generic if available.  . pregabalin (LYRICA) 225 MG capsule    Sig: Take 1 capsule (225 mg total) by mouth 2 (two) times daily for 7 days.    Dispense:  14 capsule    Refill:  0    Fill one day early if pharmacy is closed on scheduled refill date. May substitute for generic if available.   Orders:  Orders Placed This Encounter  Procedures  . LUMBAR FACET(MEDIAL BRANCH NERVE BLOCK) MBNB    Standing Status:   Future    Standing Expiration Date:   10/26/2020    Scheduling Instructions:     Procedure: Lumbar facet block (AKA.: Lumbosacral medial branch nerve block)     Side: Bilateral     Level: L3-4, L4-5, & L5-S1 Facets (L2, L3, L4, L5, & S1 Medial Branch Nerves)     Sedation: Patient's choice.     Timeframe: ASAA    Order Specific Question:   Where will this procedure be performed?    Answer:   [facility]   Follow-up plan:   Return for Procedure (w/ sedation): (B) L-FCT BLK.      Considering:  NOTE: NO RFA until BMI less or equal to 35.   Palliative PRN treatment(s):  Palliative bilateral IA Hyalgan knee injections  Palliative  bilateral genicular NB  Palliative bilateral lumbar facet blocks     Recent Visits No visits were found meeting these conditions. Showing recent visits within past 90 days and meeting all other requirements Today's Visits Date Type Provider Dept  09/25/20 Office Visit Milinda Pointer, MD [facility]  Showing today's visits and meeting all other requirements Future Appointments No visits were found  meeting these conditions. Showing future appointments within next 90 days and meeting all other requirements  I discussed the assessment and treatment plan with the patient. The patient was provided an opportunity to ask questions and all were answered. The patient agreed with the plan and demonstrated an understanding of the instructions.  Patient advised to call back or seek an in-person evaluation if the symptoms or condition worsens.  Duration of encounter: 30 minutes.  Note by: Gaspar Cola, MD Date: 09/25/2020; Time: 3:35 PM

## 2020-09-25 NOTE — Patient Instructions (Signed)
____________________________________________________________________________________________  Preparing for Procedure with Sedation  Procedure appointments are limited to planned procedures: . No Prescription Refills. . No disability issues will be discussed. . No medication changes will be discussed.  Instructions: . Oral Intake: Do not eat or drink anything for at least 8 hours prior to your procedure. (Exception: Blood Pressure Medication. See below.) . Transportation: Unless otherwise stated by your physician, you may drive yourself after the procedure. . Blood Pressure Medicine: Do not forget to take your blood pressure medicine with a sip of water the morning of the procedure. If your Diastolic (lower reading)is above 100 mmHg, elective cases will be cancelled/rescheduled. . Blood thinners: These will need to be stopped for procedures. Notify our staff if you are taking any blood thinners. Depending on which one you take, there will be specific instructions on how and when to stop it. . Diabetics on insulin: Notify the staff so that you can be scheduled 1st case in the morning. If your diabetes requires high dose insulin, take only  of your normal insulin dose the morning of the procedure and notify the staff that you have done so. . Preventing infections: Shower with an antibacterial soap the morning of your procedure. . Build-up your immune system: Take 1000 mg of Vitamin C with every meal (3 times a day) the day prior to your procedure. . Antibiotics: Inform the staff if you have a condition or reason that requires you to take antibiotics before dental procedures. . Pregnancy: If you are pregnant, call and cancel the procedure. . Sickness: If you have a cold, fever, or any active infections, call and cancel the procedure. . Arrival: You must be in the facility at least 30 minutes prior to your scheduled procedure. . Children: Do not bring children with you. . Dress appropriately:  Bring dark clothing that you would not mind if they get stained. . Valuables: Do not bring any jewelry or valuables.  Reasons to call and reschedule or cancel your procedure: (Following these recommendations will minimize the risk of a serious complication.) . Surgeries: Avoid having procedures within 2 weeks of any surgery. (Avoid for 2 weeks before or after any surgery). . Flu Shots: Avoid having procedures within 2 weeks of a flu shots or . (Avoid for 2 weeks before or after immunizations). . Barium: Avoid having a procedure within 7-10 days after having had a radiological study involving the use of radiological contrast. (Myelograms, Barium swallow or enema study). . Heart attacks: Avoid any elective procedures or surgeries for the initial 6 months after a "Myocardial Infarction" (Heart Attack). . Blood thinners: It is imperative that you stop these medications before procedures. Let us know if you if you take any blood thinner.  . Infection: Avoid procedures during or within two weeks of an infection (including chest colds or gastrointestinal problems). Symptoms associated with infections include: Localized redness, fever, chills, night sweats or profuse sweating, burning sensation when voiding, cough, congestion, stuffiness, runny nose, sore throat, diarrhea, nausea, vomiting, cold or Flu symptoms, recent or current infections. It is specially important if the infection is over the area that we intend to treat. . Heart and lung problems: Symptoms that may suggest an active cardiopulmonary problem include: cough, chest pain, breathing difficulties or shortness of breath, dizziness, ankle swelling, uncontrolled high or unusually low blood pressure, and/or palpitations. If you are experiencing any of these symptoms, cancel your procedure and contact your primary care physician for an evaluation.  Remember:  Regular Business hours are:    Monday to Thursday 8:00 AM to 4:00 PM  Provider's  Schedule: Dominik Yordy, MD:  Procedure days: Tuesday and Thursday 7:30 AM to 4:00 PM  Bilal Lateef, MD:  Procedure days: Monday and Wednesday 7:30 AM to 4:00 PM ____________________________________________________________________________________________    

## 2020-09-29 ENCOUNTER — Ambulatory Visit: Payer: Self-pay | Admitting: Urology

## 2020-09-30 ENCOUNTER — Other Ambulatory Visit: Payer: Self-pay

## 2020-09-30 NOTE — Patient Instructions (Signed)
Goals    . RN CM Monitor and Manage My Blood Sugar-Diabetes Type 2     Timeframe:  Long-Range Goal Priority:  High Start Date:   09/30/20                          Expected End Date:      01/10/21                 Follow Up Date 01/10/21    - check blood sugar at prescribed times - check blood sugar if I feel it is too high or too low - enter blood sugar readings and medication or insulin into daily log - take the blood sugar log to all doctor visits    Why is this important?    Checking your blood sugar at home helps to keep it from getting very high or very low.   Writing the results in a diary or log helps the doctor know how to care for you.   Your blood sugar log should have the time, date and the results.   Also, write down the amount of insulin or other medicine that you take.   Other information, like what you ate, exercise done and how you were feeling, will also be helpful.     Notes:     . RN CM-Make and Keep All Appointments     Timeframe:  Long-Range Goal Priority:  High Start Date:     09/30/20                        Expected End Date:  01/10/21                     Follow Up Date 01/10/21   - arrange a ride through an agency 1 week before appointment - keep a calendar with appointment dates    Why is this important?    Part of staying healthy is seeing the doctor for follow-up care.   If you forget your appointments, there are some things you can do to stay on track.    Notes:

## 2020-09-30 NOTE — Patient Outreach (Signed)
[facility]) Care Management  [place]  09/30/2020   Karen Lewis 12-01-1970 [id number]  Subjective: Telephone call to patient for follow up. Patient reports some nasal congestion.  Discussed signs of worsening cold symptoms and seeking medical attention. She verbalized understanding. Patient on ozempic now,  sugars ranging around 120. Encouraged diabetic control.  No concerns.    Objective:   Encounter Medications:  Outpatient Encounter Medications as of 09/30/2020  Medication Sig Note  . ACCU-CHEK AVIVA PLUS test strip    . acetaminophen (TYLENOL) 650 MG CR tablet Take 650-1,300 mg by mouth every 8 (eight) hours as needed for pain.   Marland Kitchen acidophilus (RISAQUAD) CAPS capsule Take 1 capsule by mouth daily.   Marland Kitchen albuterol (PROVENTIL HFA;VENTOLIN HFA) 108 (90 BASE) MCG/ACT inhaler Inhale 2 puffs into the lungs every 6 (six) hours as needed for wheezing or shortness of breath.   Marland Kitchen amitriptyline (ELAVIL) 100 MG tablet Take 100 mg by mouth at bedtime.   . Ascorbic Acid (VITAMIN C) 1000 MG tablet Take 1,000 mg by mouth daily.    . Biotin 10 MG CAPS Take 10 mg by mouth daily.    . butalbital-acetaminophen-caffeine (FIORICET) 50-325-40 MG tablet Take 1 tablet by mouth every 6 (six) hours as needed for migraine.    . calcium carbonate (TUMS EX) 750 MG chewable tablet Chew 1 tablet by mouth daily.    . clonazePAM (KLONOPIN) 0.5 MG tablet Take 1 tablet (0.5 mg total) by mouth 3 (three) times daily as needed for anxiety.   . Cyanocobalamin (B-12) 2500 MCG TABS Take 2,500 mcg by mouth daily.   . diclofenac sodium (VOLTAREN) 1 % GEL Apply 2 g topically 4 (four) times daily. (Patient taking differently: Apply 2 g topically 3 (three) times daily as needed (pain).)   . diphenhydrAMINE (BENADRYL) 25 MG tablet Take 25 mg by mouth every 8 (eight) hours as needed for itching or allergies.    Marland Kitchen EMGALITY 120 MG/ML SOAJ Inject into the skin.   Marland Kitchen EPINEPHrine 0.3 mg/0.3 mL IJ SOAJ injection  Inject 0.3 mg into the muscle as needed for anaphylaxis.    Marland Kitchen ergocalciferol (VITAMIN D2) 1.25 MG (50000 UT) capsule Take 50,000 Units by mouth 2 (two) times a week.    . famotidine (PEPCID) 20 MG tablet Take 20 mg by mouth at bedtime.   . furosemide (LASIX) 20 MG tablet Take 20 mg by mouth daily.   Marland Kitchen glucose blood (ACCU-CHEK AVIVA PLUS) test strip Use 2 (two) times daily   . levocetirizine (XYZAL) 5 MG tablet Take 5 mg by mouth at bedtime.    . magnesium oxide (MAG-OX) 400 (241.3 Mg) MG tablet SMARTSIG:1 By Mouth 4-5 Times Daily   . magnesium oxide (MAG-OX) 400 MG tablet Take 800-1,200 mg by mouth See admin instructions. Take 1200 mg by mouth in the morning and 800 mg at bedtime   . meclizine (ANTIVERT) 25 MG tablet Take 25 mg by mouth 3 (three) times daily as needed for dizziness.    . metFORMIN (GLUCOPHAGE) 1000 MG tablet Take 1,000 mg by mouth 2 (two) times daily with a meal.    . metoprolol tartrate (LOPRESSOR) 50 MG tablet Take 75 mg by mouth 2 (two) times daily.    . montelukast (SINGULAIR) 10 MG tablet Take 10 mg by mouth at bedtime.    . Multiple Vitamin (TAB-A-VITE) TABS    . naloxone (NARCAN) nasal spray 4 mg/0.1 mL Spray into one nostril. Repeat with second device into  other nostril after 2-3 minutes if no or minimal response.   . naphazoline-pheniramine (NAPHCON-A) 0.025-0.3 % ophthalmic solution Place 1 drop into both eyes 4 (four) times daily as needed for eye irritation.   . naratriptan (AMERGE) 2.5 MG tablet Take by mouth.   . nitrofurantoin, macrocrystal-monohydrate, (MACROBID) 100 MG capsule Take 1 capsule (100 mg total) by mouth daily.   Marland Kitchen omeprazole (PRILOSEC) 40 MG capsule Take 40 mg by mouth in the morning and at bedtime.   Derrill Memo ON 12/24/2020] oxyCODONE (OXY IR/ROXICODONE) 5 MG immediate release tablet Take 1 tablet (5 mg total) by mouth 2 (two) times daily. Must last 30 days 09/25/2020: FUTURE Prescription. (NOT a DUPLICATE!!) >>>DO NOT DELETE<<< (even if Expired!) See  Care Coordination Note from [facility] Pain [facility] (Dr. [name])   . OZEMPIC, 0.25 OR 0.5 MG/DOSE, 2 MG/1.5ML SOPN Inject into the skin.   Derrill Memo ON 10/02/2020] pregabalin (LYRICA) 225 MG capsule Take 1 capsule (225 mg total) by mouth 2 (two) times daily.   . pregabalin (LYRICA) 225 MG capsule Take 1 capsule (225 mg total) by mouth 2 (two) times daily for 7 days.   Marland Kitchen QUEtiapine (SEROQUEL) 100 MG tablet Take 100 mg by mouth at bedtime.   . rizatriptan (MAXALT) 10 MG tablet TAKE 1 TABLET AT ONSET OF MIGRAINE; MAY REPEAT ONCE AFTER 2 HOURS IF NEEDED.   . rizatriptan (MAXALT-MLT) 10 MG disintegrating tablet Take by mouth.   . rosuvastatin (CRESTOR) 40 MG tablet Take 40 mg by mouth daily.    . sertraline (ZOLOFT) 100 MG tablet Take 200 mg by mouth daily.    Marland Kitchen spironolactone (ALDACTONE) 25 MG tablet Take 37.5 mg by mouth 2 (two) times daily.    Marland Kitchen tiZANidine (ZANAFLEX) 4 MG tablet Take 1 tablet (4 mg total) by mouth 2 (two) times daily as needed for muscle spasms.   Marland Kitchen topiramate (TOPAMAX) 100 MG tablet Take by mouth.   . topiramate (TOPAMAX) 50 MG tablet Take 50 mg by mouth 2 (two) times daily.   Marland Kitchen triamcinolone (NASACORT) 55 MCG/ACT AERO nasal inhaler Place 2 sprays into the nose 2 (two) times daily as needed (allergies).    . zolpidem (AMBIEN) 10 MG tablet Take 10 mg by mouth at bedtime as needed for sleep.   Marland Kitchen zonisamide (ZONEGRAN) 50 MG capsule Take 150 mg by mouth at bedtime.    Marland Kitchen levothyroxine (SYNTHROID) 125 MCG tablet Take 250 mcg by mouth daily before breakfast.    . lisinopril (ZESTRIL) 5 MG tablet Take 5 mg by mouth at bedtime.     No facility-administered encounter medications on file as of 09/30/2020.    Functional Status:  In your present state of health, do you have any difficulty performing the following activities: 09/30/2020 04/18/2020  Hearing? N -  Vision? N -  Difficulty concentrating or making decisions? N -  Walking or climbing stairs? Y Y  Comment recent right hip replacement  -  Dressing or bathing? Y -  Comment recent right hip replacement -  Doing errands, shopping? Y N  Comment recent hip replacement- fiance helps -  Preparing Food and eating ? Y -  Comment fiance helps -  Using the Toilet? N -  Comment - -  In the past six months, have you accidently leaked urine? N -  Do you have problems with loss of bowel control? N -  Managing your Medications? N -  Managing your Finances? N -  Housekeeping or managing your Housekeeping? Y -  Comment fiance helps -  Some recent data might be hidden    Fall/Depression Screening: Fall Risk  09/30/2020 09/25/2020 06/24/2020  Falls in the past year? 0 0 0  Number falls in past yr: - - -  Comment - - -  Injury with Fall? - - -  Comment - - -  Risk Factor Category  - - -  Risk for fall due to : - - -  Risk for fall due to: Comment - - -  Follow up - - -   PHQ 2/9 Scores 09/25/2020 04/17/2020 04/08/2020 03/13/2020 12/14/2019 12/13/2019 04/27/2018  PHQ - 2 Score 0 0 0 0 4 4 0  PHQ- 9 Score - - - - 12 12 -  Exception Documentation - - - - - - -    Assessment: Patient continues to manage chronic conditions. Goals Addressed            This Visit's Progress   . RN CM Monitor and Manage My Blood Sugar-Diabetes Type 2       Timeframe:  Long-Range Goal Priority:  High Start Date:   09/30/20                          Expected End Date:      01/10/21                 Follow Up Date 01/10/21    - check blood sugar at prescribed times - check blood sugar if I feel it is too high or too low - enter blood sugar readings and medication or insulin into daily log - take the blood sugar log to all doctor visits    Why is this important?    Checking your blood sugar at home helps to keep it from getting very high or very low.   Writing the results in a diary or log helps the doctor know how to care for you.   Your blood sugar log should have the time, date and the results.   Also, write down the amount of insulin or other  medicine that you take.   Other information, like what you ate, exercise done and how you were feeling, will also be helpful.     Notes:     . RN CM-Make and Keep All Appointments       Timeframe:  Long-Range Goal Priority:  High Start Date:     09/30/20                        Expected End Date:  01/10/21                     Follow Up Date 01/10/21   - arrange a ride through an agency 1 week before appointment - keep a calendar with appointment dates    Why is this important?    Part of staying healthy is seeing the doctor for follow-up care.   If you forget your appointments, there are some things you can do to stay on track.    Notes:        Plan: RN CM will follow up in April. Follow-up:  Patient agrees to Care Plan and Follow-up.   Jone Baseman, RN, MSN [place] [facility] Care Management Coordinator Direct Line [phone] Cell [phone] Toll Free: [phone]  Fax: [phone]

## 2020-10-01 ENCOUNTER — Encounter: Payer: Medicare HMO | Admitting: Pain Medicine

## 2020-10-01 DIAGNOSIS — R059 Cough, unspecified: Secondary | ICD-10-CM | POA: Diagnosis not present

## 2020-10-01 DIAGNOSIS — J069 Acute upper respiratory infection, unspecified: Secondary | ICD-10-CM | POA: Diagnosis not present

## 2020-10-01 DIAGNOSIS — Z20822 Contact with and (suspected) exposure to covid-19: Secondary | ICD-10-CM | POA: Diagnosis not present

## 2020-10-01 DIAGNOSIS — U071 COVID-19: Secondary | ICD-10-CM | POA: Diagnosis not present

## 2020-10-06 ENCOUNTER — Ambulatory Visit: Payer: Self-pay | Admitting: Urology

## 2020-10-08 DIAGNOSIS — M4184 Other forms of scoliosis, thoracic region: Secondary | ICD-10-CM | POA: Diagnosis not present

## 2020-10-08 DIAGNOSIS — M47814 Spondylosis without myelopathy or radiculopathy, thoracic region: Secondary | ICD-10-CM | POA: Diagnosis not present

## 2020-10-08 DIAGNOSIS — R918 Other nonspecific abnormal finding of lung field: Secondary | ICD-10-CM | POA: Diagnosis not present

## 2020-10-08 DIAGNOSIS — Z79899 Other long term (current) drug therapy: Secondary | ICD-10-CM | POA: Diagnosis not present

## 2020-10-08 DIAGNOSIS — U071 COVID-19: Secondary | ICD-10-CM | POA: Diagnosis not present

## 2020-10-17 ENCOUNTER — Other Ambulatory Visit: Payer: Self-pay

## 2020-10-17 ENCOUNTER — Inpatient Hospital Stay: Payer: Medicare HMO | Attending: Oncology

## 2020-10-17 DIAGNOSIS — Z8616 Personal history of COVID-19: Secondary | ICD-10-CM | POA: Diagnosis not present

## 2020-10-17 DIAGNOSIS — Z9884 Bariatric surgery status: Secondary | ICD-10-CM | POA: Diagnosis not present

## 2020-10-17 DIAGNOSIS — D509 Iron deficiency anemia, unspecified: Secondary | ICD-10-CM | POA: Diagnosis not present

## 2020-10-17 LAB — CBC WITH DIFFERENTIAL/PLATELET
Abs Immature Granulocytes: 0.1 10*3/uL — ABNORMAL HIGH (ref 0.00–0.07)
Basophils Absolute: 0 10*3/uL (ref 0.0–0.1)
Basophils Relative: 0 %
Eosinophils Absolute: 0.1 10*3/uL (ref 0.0–0.5)
Eosinophils Relative: 1 %
HCT: 38.8 % (ref 36.0–46.0)
Hemoglobin: 12.9 g/dL (ref 12.0–15.0)
Immature Granulocytes: 1 %
Lymphocytes Relative: 13 %
Lymphs Abs: 1.6 10*3/uL (ref 0.7–4.0)
MCH: 28.3 pg (ref 26.0–34.0)
MCHC: 33.2 g/dL (ref 30.0–36.0)
MCV: 85.1 fL (ref 80.0–100.0)
Monocytes Absolute: 0.4 10*3/uL (ref 0.1–1.0)
Monocytes Relative: 4 %
Neutro Abs: 9.8 10*3/uL — ABNORMAL HIGH (ref 1.7–7.7)
Neutrophils Relative %: 81 %
Platelets: 373 10*3/uL (ref 150–400)
RBC: 4.56 MIL/uL (ref 3.87–5.11)
RDW: 13.5 % (ref 11.5–15.5)
WBC: 12.1 10*3/uL — ABNORMAL HIGH (ref 4.0–10.5)
nRBC: 0 % (ref 0.0–0.2)

## 2020-10-17 LAB — COMPREHENSIVE METABOLIC PANEL
ALT: 43 U/L (ref 0–44)
AST: 40 U/L (ref 15–41)
Albumin: 3.6 g/dL (ref 3.5–5.0)
Alkaline Phosphatase: 60 U/L (ref 38–126)
Anion gap: 11 (ref 5–15)
BUN: 17 mg/dL (ref 6–20)
CO2: 22 mmol/L (ref 22–32)
Calcium: 8.3 mg/dL — ABNORMAL LOW (ref 8.9–10.3)
Chloride: 104 mmol/L (ref 98–111)
Creatinine, Ser: 0.95 mg/dL (ref 0.44–1.00)
GFR, Estimated: >60 mL/min (ref >60)
Glucose, Bld: 201 mg/dL — ABNORMAL HIGH (ref 70–99)
Potassium: 4.5 mmol/L (ref 3.5–5.1)
Sodium: 137 mmol/L (ref 135–145)
Total Bilirubin: 0.7 mg/dL (ref 0.3–1.2)
Total Protein: 7.1 g/dL (ref 6.5–8.1)

## 2020-10-17 LAB — FERRITIN: Ferritin: 102 ng/mL (ref 11–307)

## 2020-10-17 LAB — IRON AND TIBC
Iron: 94 ug/dL (ref 28–170)
Saturation Ratios: 24 % (ref 10.4–31.8)
TIBC: 398 ug/dL (ref 250–450)
UIBC: 304 ug/dL

## 2020-10-17 LAB — VITAMIN B12: Vitamin B-12: 719 pg/mL (ref 180–914)

## 2020-10-20 ENCOUNTER — Inpatient Hospital Stay: Payer: Medicare HMO

## 2020-10-20 ENCOUNTER — Inpatient Hospital Stay (HOSPITAL_BASED_OUTPATIENT_CLINIC_OR_DEPARTMENT_OTHER): Payer: Medicare HMO | Admitting: Oncology

## 2020-10-20 ENCOUNTER — Other Ambulatory Visit: Payer: Self-pay | Admitting: Internal Medicine

## 2020-10-20 ENCOUNTER — Encounter: Payer: Self-pay | Admitting: Oncology

## 2020-10-20 VITALS — BP 126/86 | HR 89 | Temp 96.0°F | Resp 18 | Wt 357.0 lb

## 2020-10-20 DIAGNOSIS — D509 Iron deficiency anemia, unspecified: Secondary | ICD-10-CM

## 2020-10-20 DIAGNOSIS — Z8616 Personal history of COVID-19: Secondary | ICD-10-CM | POA: Diagnosis not present

## 2020-10-20 DIAGNOSIS — Z9884 Bariatric surgery status: Secondary | ICD-10-CM | POA: Diagnosis not present

## 2020-10-20 DIAGNOSIS — Z1231 Encounter for screening mammogram for malignant neoplasm of breast: Secondary | ICD-10-CM

## 2020-10-20 NOTE — Progress Notes (Signed)
Patient has a recent decrease in energy and had COVID 3 weeks ago.

## 2020-10-20 NOTE — Progress Notes (Signed)
Hematology/Oncology follow up note Karen Lewis [phone] [phone]   Patient Care Team: Idelle Crouch, MD as PCP - General (Internal Medicine) Jon Billings, RN as [facility]  REFERRING PROVIDER: Idelle Crouch, MD CHIEF COMPLAINTS/REASON FOR VISIT:  Follow up  iron deficiency anemia  HISTORY OF PRESENTING ILLNESS:  Karen Lewis is a  50 y.o.  female with PMH listed below was seen in consultation at the request of Idelle Crouch, MD   for evaluation of iron deficiency anemia.   Reviewed patient's recent labs  Labs revealed anemia with hemoglobin of .   Reviewed patient's previous labs  05/16/2019 CBC showed hemoglobin 9.4, WBC 11.4, MCV 68.7, platelet count 352,000. Iron panel showed ferritin of 8, iron 34, folate 19.3, No aggravating or improving factors.  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  She follows up with ID Dr. Tama High.    Associated signs and symptoms: Patient reports fatigue.  Associated with SOB with exertion.  Denies weight loss, easy bruising, hematochezia, hemoptysis, hematuria. Context:  History of iron deficiency: Denies Rectal bleeding: Denies denies Menstrual bleeding/ Vaginal bleeding : Denies Hematemesis or hemoptysis : denies Blood in urine : denies  Last endoscopy: History of gastric sleeve.  And then history of gastric bypass on 09/21/ 2018 Fatigue: Yes.  SOB: Yes  +Chronic lower extremity swelling/lymphedema. Moved from [place] 10 years ago.  Previously worked as a Marine scientist.  INTERVAL HISTORY Karen Lewis is a 50 y.o. female who has above history reviewed by me today presents for follow up visit for management of iron deficiency anemia Problems and complaints are listed below Patient reports that she had COVID-19 pneumonia 3 weeks ago.  She is recovering.   Denies any shortness of breath or worsening of her breathing status.  No new complaints.  Chronic fatigue, unchanged.   Review of Systems  Constitutional: Positive for fatigue. Negative for appetite change, chills and fever.  HENT:   Negative for hearing loss and voice change.   Eyes: Negative for eye problems.  Respiratory: Negative for chest tightness, cough and shortness of breath.   Cardiovascular: Positive for leg swelling. Negative for chest pain.  Gastrointestinal: Negative for abdominal distention, abdominal pain and blood in stool.  Endocrine: Negative for hot flashes.  Genitourinary: Negative for difficulty urinating and frequency.   Musculoskeletal: Negative for arthralgias.  Skin: Negative for itching and rash.  Neurological: Negative for extremity weakness.  Hematological: Negative for adenopathy.  Psychiatric/Behavioral: Negative for confusion.    MEDICAL HISTORY:  Past Medical History:  Diagnosis Date  . Anemia   . Anginal pain ([place])   . Anxiety   . Arthralgia of hip 07/29/2015  . Arthritis   . Arthritis, degenerative 07/29/2015  . Asthma   . Cephalalgia 07/25/2014  . Dependence on unknown drug ([place])    multiplt controlled drug dependence  . Depression   . Diabetes mellitus without complication ([place])   . Difficult intubation    per pt needs small tube (has had abrasions from tube in past)  . Dysrhythmia    PVC's  . Eczema   . Fibromyalgia   . Gastritis   . GERD (gastroesophageal reflux disease)   . Gonalgia 07/29/2015   Overview:  Overview:  The patient has had bilateral intra-articular Hyalgan injections done on 07/16/2014 and although she seems to do well with this type of therapy,  apparently her insurance company does not want to pay for they Hyalgan. On 11/27/2014 the patient underwent a bilateral genicular nerve block with excellent results. On 01/28/2015 she had a right knee genicular radiofrequency ablatio  . Gout   . H/O cardiovascular disorder  03/10/2015  . H/O surgical procedure 12/05/2012   Overview:  LSG ([facility] - April 2013)    . H/O thyroid disease 03/10/2015  . Headache   . Herpes   . History of artificial joint 07/29/2015  . History of hiatal hernia   . Hypertension   . Hypomagnesemia   . Hypothyroidism   . IDA (iron deficiency anemia) 05/28/2019  . LBP (low back pain) 07/29/2015  . Neuromuscular disorder ([place])   . Obesity   . PCOS (polycystic ovarian syndrome)   . Primary osteoarthritis of both knees 07/29/2015  . Sleep apnea    not using a Cpap machine at this time - most recent test mild apnea does not qualify for test (not OSA)  . Thyroid nodule bilateral  . Umbilical hernia     SURGICAL HISTORY: Past Surgical History:  Procedure Laterality Date  . APPENDECTOMY    . CARPAL TUNNEL RELEASE Bilateral   . CHOLECYSTECTOMY    . DIAGNOSTIC LAPAROSCOPY    . HIATAL HERNIA REPAIR    . JOINT REPLACEMENT Bilateral hip  . LAPAROSCOPIC PARTIAL GASTRECTOMY    . left trigger finger    . peniculectomy N/A 07/05/2018  . ROUX-EN-Y GASTRIC BYPASS  06/03/2017  . SHOULDER ARTHROSCOPY Right   . THYROIDECTOMY N/A 11/12/2015   Procedure: THYROIDECTOMY;  Surgeon: Clyde Canterbury, MD;  Location: [facility] ORS;  Service: ENT;  Laterality: N/A;  . TOTAL HIP ARTHROPLASTY Right 11/27/2019   Procedure: TOTAL HIP ARTHROPLASTY ANTERIOR APPROACH;  Surgeon: Hessie Knows, MD;  Location: [facility] ORS;  Service: Orthopedics;  Laterality: Right;  . TRIGGER FINGER RELEASE Right   . TRIGGER FINGER RELEASE Right 04/24/2020   Procedure: Right ring and middle trigger release;  Surgeon: Hessie Knows, MD;  Location: [facility] ORS;  Service: Orthopedics;  Laterality: Right;    SOCIAL HISTORY: Social History   Socioeconomic History  . Marital status: Significant Other    Spouse name: Not on file  . Number of children: 0  . Years of education: 2  . Highest education level: High school graduate  Occupational History  . Occupation: Disabled  Tobacco Use  .  Smoking status: Never Smoker  . Smokeless tobacco: Never Used  Vaping Use  . Vaping Use: Never used  Substance and Sexual Activity  . Alcohol use: No    Alcohol/week: 0.0 standard drinks  . Drug use: No  . Sexual activity: Not Currently    Birth control/protection: Injection  Other Topics Concern  . Not on file  Social History Narrative  . Not on file   Social Determinants of Health   Financial Resource Strain: Low Risk   . Difficulty of Paying Living Expenses: Not hard at all  Food Insecurity: No Food Insecurity  . Worried About Charity fundraiser in the Last Year: Never true  . Ran Out of Food in the Last Year: Never true  Transportation Needs: No Transportation Needs  . Lack of Transportation (Medical): No  . Lack of Transportation (Non-Medical): No  Physical Activity: Inactive  . Days of Exercise per Week: 0 days  . Minutes of Exercise per Session: 0 min  Stress: Stress Concern Present  . Feeling of Stress : Rather much  Social Connections: Unknown  . Frequency  of Communication with Friends and Family: More than three times a week  . Frequency of Social Gatherings with Friends and Family: More than three times a week  . Attends Religious Services: More than 4 times per year  . Active Member of Clubs or Organizations: No  . Attends Archivist Meetings: Never  . Marital Status: Not on file  Intimate Partner Violence: Not At Risk  . Fear of Current or Ex-Partner: No  . Emotionally Abused: No  . Physically Abused: No  . Sexually Abused: No    FAMILY HISTORY: Family History  Problem Relation Age of Onset  . Anxiety disorder Mother   . Depression Mother   . Alcohol abuse Mother   . Diabetes Mother   . Hypertension Mother   . Kidney cancer Mother   . Sleep apnea Mother   . Alcohol abuse Father   . Anxiety disorder Father   . Depression Father   . Post-traumatic stress disorder Father   . Kidney failure Father   . COPD Father   . Diabetes Father   .  Hypertension Father   . Sleep apnea Father   . Depression Brother   . Diabetes Brother   . Hypertension Brother   . Sleep apnea Brother   . Breast cancer Paternal Aunt   . Bladder Cancer Neg Hx   . Prostate cancer Neg Hx     ALLERGIES:  is allergic to bactrim [sulfamethoxazole-trimethoprim], omalizumab, ciprofloxacin, shellfish allergy, and nsaids.  MEDICATIONS:  Current Outpatient Medications  Medication Sig Dispense Refill  . ACCU-CHEK AVIVA PLUS test strip     . acetaminophen (TYLENOL) 650 MG CR tablet Take 650-1,300 mg by mouth every 8 (eight) hours as needed for pain.    Marland Kitchen acidophilus (RISAQUAD) CAPS capsule Take 1 capsule by mouth daily.    Marland Kitchen albuterol (PROVENTIL HFA;VENTOLIN HFA) 108 (90 BASE) MCG/ACT inhaler Inhale 2 puffs into the lungs every 6 (six) hours as needed for wheezing or shortness of breath.    Marland Kitchen amitriptyline (ELAVIL) 100 MG tablet Take 100 mg by mouth at bedtime.    . Ascorbic Acid (VITAMIN C) 1000 MG tablet Take 1,000 mg by mouth daily.     . Biotin 10 MG CAPS Take 10 mg by mouth daily.     . butalbital-acetaminophen-caffeine (FIORICET) 50-325-40 MG tablet Take 1 tablet by mouth every 6 (six) hours as needed for migraine.     . calcium carbonate (TUMS EX) 750 MG chewable tablet Chew 1 tablet by mouth daily.     . clonazePAM (KLONOPIN) 0.5 MG tablet Take 1 tablet (0.5 mg total) by mouth 3 (three) times daily as needed for anxiety. 30 tablet 0  . Cyanocobalamin (B-12) 2500 MCG TABS Take 2,500 mcg by mouth daily.    . diclofenac sodium (VOLTAREN) 1 % GEL Apply 2 g topically 4 (four) times daily. (Patient taking differently: Apply 2 g topically 3 (three) times daily as needed (pain).) 100 g 1  . diphenhydrAMINE (BENADRYL) 25 MG tablet Take 25 mg by mouth every 8 (eight) hours as needed for itching or allergies.     Marland Kitchen EMGALITY 120 MG/ML SOAJ Inject into the skin.    Marland Kitchen EPINEPHrine 0.3 mg/0.3 mL IJ SOAJ injection Inject 0.3 mg into the muscle as needed for anaphylaxis.      Marland Kitchen ergocalciferol (VITAMIN D2) 1.25 MG (50000 UT) capsule Take 50,000 Units by mouth 2 (two) times a week.     . famotidine (PEPCID) 20 MG tablet Take  20 mg by mouth at bedtime.    . furosemide (LASIX) 20 MG tablet Take 20 mg by mouth daily.    Marland Kitchen glucose blood (ACCU-CHEK AVIVA PLUS) test strip Use 2 (two) times daily    . levocetirizine (XYZAL) 5 MG tablet Take 5 mg by mouth at bedtime.     Marland Kitchen levothyroxine (SYNTHROID) 125 MCG tablet Take 250 mcg by mouth daily before breakfast.     . lisinopril (ZESTRIL) 5 MG tablet Take 5 mg by mouth at bedtime.     . magnesium oxide (MAG-OX) 400 (241.3 Mg) MG tablet SMARTSIG:1 By Mouth 4-5 Times Daily    . magnesium oxide (MAG-OX) 400 MG tablet Take 800-1,200 mg by mouth See admin instructions. Take 1200 mg by mouth in the morning and 800 mg at bedtime    . meclizine (ANTIVERT) 25 MG tablet Take 25 mg by mouth 3 (three) times daily as needed for dizziness.     . metFORMIN (GLUCOPHAGE) 1000 MG tablet Take 1,000 mg by mouth 2 (two) times daily with a meal.     . metoprolol tartrate (LOPRESSOR) 50 MG tablet Take 75 mg by mouth 2 (two) times daily.     . montelukast (SINGULAIR) 10 MG tablet Take 10 mg by mouth at bedtime.     . Multiple Vitamin (TAB-A-VITE) TABS     . naloxone (NARCAN) nasal spray 4 mg/0.1 mL Spray into one nostril. Repeat with second device into other nostril after 2-3 minutes if no or minimal response. 1 kit 0  . naphazoline-pheniramine (NAPHCON-A) 0.025-0.3 % ophthalmic solution Place 1 drop into both eyes 4 (four) times daily as needed for eye irritation.    . naratriptan (AMERGE) 2.5 MG tablet Take by mouth.    . nitrofurantoin, macrocrystal-monohydrate, (MACROBID) 100 MG capsule Take 1 capsule (100 mg total) by mouth daily. 30 capsule 11  . omeprazole (PRILOSEC) 40 MG capsule Take 40 mg by mouth in the morning and at bedtime.    Derrill Memo ON 12/24/2020] oxyCODONE (OXY IR/ROXICODONE) 5 MG immediate release tablet Take 1 tablet (5 mg total) by  mouth 2 (two) times daily. Must last 30 days 60 tablet 0  . OZEMPIC, 0.25 OR 0.5 MG/DOSE, 2 MG/1.5ML SOPN Inject into the skin.    . pregabalin (LYRICA) 225 MG capsule Take 1 capsule (225 mg total) by mouth 2 (two) times daily. 180 capsule 0  . pregabalin (LYRICA) 225 MG capsule Take 1 capsule (225 mg total) by mouth 2 (two) times daily for 7 days. 14 capsule 0  . QUEtiapine (SEROQUEL) 100 MG tablet Take 100 mg by mouth at bedtime.    . rizatriptan (MAXALT) 10 MG tablet TAKE 1 TABLET AT ONSET OF MIGRAINE; MAY REPEAT ONCE AFTER 2 HOURS IF NEEDED.    . rizatriptan (MAXALT-MLT) 10 MG disintegrating tablet Take by mouth.    . rosuvastatin (CRESTOR) 40 MG tablet Take 40 mg by mouth daily.     . sertraline (ZOLOFT) 100 MG tablet Take 200 mg by mouth daily.     Marland Kitchen spironolactone (ALDACTONE) 25 MG tablet Take 37.5 mg by mouth 2 (two) times daily.     Marland Kitchen tiZANidine (ZANAFLEX) 4 MG tablet Take 1 tablet (4 mg total) by mouth 2 (two) times daily as needed for muscle spasms. 30 tablet 0  . topiramate (TOPAMAX) 100 MG tablet Take by mouth.    . topiramate (TOPAMAX) 50 MG tablet Take 50 mg by mouth 2 (two) times daily.    Marland Kitchen triamcinolone (NASACORT)  55 MCG/ACT AERO nasal inhaler Place 2 sprays into the nose 2 (two) times daily as needed (allergies).     . zolpidem (AMBIEN) 10 MG tablet Take 10 mg by mouth at bedtime as needed for sleep.    Marland Kitchen zonisamide (ZONEGRAN) 50 MG capsule Take 150 mg by mouth at bedtime.      No current facility-administered medications for this visit.     PHYSICAL EXAMINATION: ECOG PERFORMANCE STATUS: 2 - Symptomatic, <50% confined to bed Vitals:   10/20/20 1335  BP: 126/86  Pulse: 89  Resp: 18  Temp: (!) 96 F (35.6 C)   Filed Weights   10/20/20 1335  Weight: (!) 357 lb (161.9 kg)    Physical Exam Constitutional:      General: She is not in acute distress.    Appearance: She is obese.  HENT:     Head: Normocephalic and atraumatic.  Eyes:     General: No scleral  icterus.    Pupils: Pupils are equal, round, and reactive to light.  Cardiovascular:     Rate and Rhythm: Normal rate and regular rhythm.     Heart sounds: Normal heart sounds.  Pulmonary:     Effort: Pulmonary effort is normal. No respiratory distress.     Breath sounds: No wheezing.  Abdominal:     General: Bowel sounds are normal. There is no distension.     Palpations: Abdomen is soft. There is no mass.     Tenderness: There is no abdominal tenderness.  Musculoskeletal:        General: No deformity. Normal range of motion.     Cervical back: Normal range of motion and neck supple.  Skin:    General: Skin is warm and dry.     Coloration: Skin is not pale.     Findings: No erythema or rash.  Neurological:     Mental Status: She is alert and oriented to person, place, and time.     Cranial Nerves: No cranial nerve deficit.     Coordination: Coordination normal.  Psychiatric:        Behavior: Behavior normal.        Thought Content: Thought content normal.       CMP Latest Ref Rng & Units 10/17/2020  Glucose 70 - 99 mg/dL 201(H)  BUN 6 - 20 mg/dL 17  Creatinine 0.44 - 1.00 mg/dL 0.95  Sodium 135 - 145 mmol/L 137  Potassium 3.5 - 5.1 mmol/L 4.5  Chloride 98 - 111 mmol/L 104  CO2 22 - 32 mmol/L 22  Calcium 8.9 - 10.3 mg/dL 8.3(L)  Total Protein 6.5 - 8.1 g/dL 7.1  Total Bilirubin 0.3 - 1.2 mg/dL 0.7  Alkaline Phos 38 - 126 U/L 60  AST 15 - 41 U/L 40  ALT 0 - 44 U/L 43   CBC Latest Ref Rng & Units 10/17/2020  WBC 4.0 - 10.5 K/uL 12.1(H)  Hemoglobin 12.0 - 15.0 g/dL 12.9  Hematocrit 36.0 - 46.0 % 38.8  Platelets 150 - 400 K/uL 373     LABORATORY DATA:  I have reviewed the data as listed Lab Results  Component Value Date   WBC 12.1 (H) 10/17/2020   HGB 12.9 10/17/2020   HCT 38.8 10/17/2020   MCV 85.1 10/17/2020   PLT 373 10/17/2020   Recent Labs    12/05/19 0445 12/11/19 1715 04/16/20 1340 08/31/20 1254 10/17/20 1308  NA 138 137 137 135 137  K 4.6 4.3  4.5 4.5 4.5  CL  107 101 104 104 104  CO2 '24 24 22 22 22  ' GLUCOSE 164* 112* 135* 147* 201*  BUN 26* '12 19 17 17  ' CREATININE 0.90 1.07* 0.81 0.96 0.95  CALCIUM 7.9* 8.6* 8.5* 8.2* 8.3*  GFRNONAA >60 >60 >60 >60 >60  GFRAA >60 >60 >60  --   --   PROT  --  7.9 7.5  --  7.1  ALBUMIN  --  3.7 4.3  --  3.6  AST  --  27 54*  --  40  ALT  --  23 51*  --  43  ALKPHOS  --  99 75  --  60  BILITOT  --  0.4 0.6  --  0.7   Iron/TIBC/Ferritin/ %Sat    Component Value Date/Time   IRON 94 10/17/2020 1308   TIBC 398 10/17/2020 1308   FERRITIN 102 10/17/2020 1308   IRONPCTSAT 24 10/17/2020 1308     No results found.    ASSESSMENT & PLAN:  1. Iron deficiency anemia, unspecified iron deficiency anemia type   2. History of gastric bypass   3. Hypocalcemia    #Iron deficiency anemia in the context of History of gastric bypass Labs are reviewed and discussed with patient. Patient has stable iron level and hemoglobin level. I will hold off Venofer treatment  Vitamin B12 level is also stable and normal. Recommend patient to continue oral vitamin B12 supplementation.  Patient has a history of chronic hypomagnesia for which she takes magnesium oxide.  Hypocalcemia, patient follows up with endocrinology.  She is on calcium supplementation 1500 mg daily  Follow-up in 6 months.  Labs prior CBC, iron TIBC ferritin, B12.  All questions were answered. The patient knows to call the clinic with any problems questions or concerns.  Cc Idelle Crouch, MD  Return of visit: 4 weeks.   Earlie Server, MD, PhD Hematology Oncology [facility] Pager- [phone] 10/20/2020

## 2020-10-23 DIAGNOSIS — E559 Vitamin D deficiency, unspecified: Secondary | ICD-10-CM | POA: Diagnosis not present

## 2020-10-23 DIAGNOSIS — E119 Type 2 diabetes mellitus without complications: Secondary | ICD-10-CM | POA: Diagnosis not present

## 2020-10-23 DIAGNOSIS — E1159 Type 2 diabetes mellitus with other circulatory complications: Secondary | ICD-10-CM | POA: Diagnosis not present

## 2020-10-23 DIAGNOSIS — I152 Hypertension secondary to endocrine disorders: Secondary | ICD-10-CM | POA: Diagnosis not present

## 2020-10-23 DIAGNOSIS — Z79899 Other long term (current) drug therapy: Secondary | ICD-10-CM | POA: Diagnosis not present

## 2020-10-23 DIAGNOSIS — R0602 Shortness of breath: Secondary | ICD-10-CM | POA: Diagnosis not present

## 2020-10-23 DIAGNOSIS — R9389 Abnormal findings on diagnostic imaging of other specified body structures: Secondary | ICD-10-CM | POA: Diagnosis not present

## 2020-10-23 DIAGNOSIS — U071 COVID-19: Secondary | ICD-10-CM | POA: Diagnosis not present

## 2020-10-23 DIAGNOSIS — E1169 Type 2 diabetes mellitus with other specified complication: Secondary | ICD-10-CM | POA: Diagnosis not present

## 2020-10-23 DIAGNOSIS — E785 Hyperlipidemia, unspecified: Secondary | ICD-10-CM | POA: Diagnosis not present

## 2020-10-30 DIAGNOSIS — E1169 Type 2 diabetes mellitus with other specified complication: Secondary | ICD-10-CM | POA: Diagnosis not present

## 2020-10-30 DIAGNOSIS — J4540 Moderate persistent asthma, uncomplicated: Secondary | ICD-10-CM | POA: Diagnosis not present

## 2020-10-30 DIAGNOSIS — I152 Hypertension secondary to endocrine disorders: Secondary | ICD-10-CM | POA: Diagnosis not present

## 2020-10-30 DIAGNOSIS — R0600 Dyspnea, unspecified: Secondary | ICD-10-CM | POA: Diagnosis not present

## 2020-10-30 DIAGNOSIS — E559 Vitamin D deficiency, unspecified: Secondary | ICD-10-CM | POA: Diagnosis not present

## 2020-10-30 DIAGNOSIS — U071 COVID-19: Secondary | ICD-10-CM | POA: Diagnosis not present

## 2020-10-30 DIAGNOSIS — E785 Hyperlipidemia, unspecified: Secondary | ICD-10-CM | POA: Diagnosis not present

## 2020-10-30 DIAGNOSIS — E1159 Type 2 diabetes mellitus with other circulatory complications: Secondary | ICD-10-CM | POA: Diagnosis not present

## 2020-10-30 DIAGNOSIS — Z79899 Other long term (current) drug therapy: Secondary | ICD-10-CM | POA: Diagnosis not present

## 2020-10-30 DIAGNOSIS — I10 Essential (primary) hypertension: Secondary | ICD-10-CM | POA: Diagnosis not present

## 2020-10-30 DIAGNOSIS — E890 Postprocedural hypothyroidism: Secondary | ICD-10-CM | POA: Diagnosis not present

## 2020-10-30 DIAGNOSIS — Z6841 Body Mass Index (BMI) 40.0 and over, adult: Secondary | ICD-10-CM | POA: Diagnosis not present

## 2020-11-04 DIAGNOSIS — E559 Vitamin D deficiency, unspecified: Secondary | ICD-10-CM | POA: Diagnosis not present

## 2020-11-04 DIAGNOSIS — E038 Other specified hypothyroidism: Secondary | ICD-10-CM | POA: Diagnosis not present

## 2020-11-04 DIAGNOSIS — E519 Thiamine deficiency, unspecified: Secondary | ICD-10-CM | POA: Diagnosis not present

## 2020-11-04 DIAGNOSIS — R519 Headache, unspecified: Secondary | ICD-10-CM | POA: Diagnosis not present

## 2020-11-04 DIAGNOSIS — E538 Deficiency of other specified B group vitamins: Secondary | ICD-10-CM | POA: Diagnosis not present

## 2020-12-01 ENCOUNTER — Encounter: Payer: Self-pay | Admitting: Urology

## 2020-12-01 ENCOUNTER — Ambulatory Visit
Admission: RE | Admit: 2020-12-01 | Discharge: 2020-12-01 | Disposition: A | Discharge status: Home / Self Care | Payer: Medicare HMO | Source: Ambulatory Visit | Attending: Internal Medicine | Admitting: Internal Medicine

## 2020-12-01 ENCOUNTER — Other Ambulatory Visit: Payer: Self-pay

## 2020-12-01 ENCOUNTER — Ambulatory Visit: Payer: Medicare HMO | Admitting: Urology

## 2020-12-01 VITALS — BP 151/79 | HR 88

## 2020-12-01 DIAGNOSIS — Z1231 Encounter for screening mammogram for malignant neoplasm of breast: Secondary | ICD-10-CM | POA: Insufficient documentation

## 2020-12-01 DIAGNOSIS — N3020 Other chronic cystitis without hematuria: Secondary | ICD-10-CM | POA: Diagnosis not present

## 2020-12-01 MED ORDER — NITROFURANTOIN MONOHYD MACRO 100 MG PO CAPS: 100.0000 mg | ORAL_CAPSULE | Freq: Every day | ORAL | 11 refills | Status: DC

## 2020-12-01 NOTE — Progress Notes (Signed)
12/01/2020 1:37 PM   Karen Lewis 05/05/1971 [id number]  Referring provider: Idelle Crouch, MD [facility] [place],  [place] [address]  No chief complaint on file.   HPI: The patient reports an infection at least every 3 months when she feels tired foul-smelling urine burning and respond favorably antibiotics.  She had the same problem years ago.  She recently had MRSA.  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  Gets up once a night.  No hysterectomy.    Work-up and pathophysiology of chronic cystitis discussed.  Call if ultrasound normal.  Call culture normal.  Reassess in 6 weeks on daily Macrodantin 100 mg 3x11  Today Frequency stable.  Last culture negative.  Ultrasound normal. Infection free on Macrodantin.  Very pleased  PMH: Past Medical History:  Diagnosis Date  . Anemia   . Anginal pain ([place])   . Anxiety   . Arthralgia of hip 07/29/2015  . Arthritis   . Arthritis, degenerative 07/29/2015  . Asthma   . Cephalalgia 07/25/2014  . Dependence on unknown drug ([place])    multiplt controlled drug dependence  . Depression   . Diabetes mellitus without complication ([place])   . Difficult intubation    per pt needs small tube (has had abrasions from tube in past)  . Dysrhythmia    PVC's  . Eczema   . Fibromyalgia   . Gastritis   . GERD (gastroesophageal reflux disease)   . Gonalgia 07/29/2015   Overview:  Overview:  The patient has had bilateral intra-articular Hyalgan injections done on 07/16/2014 and although she seems to do well with this type of therapy, apparently her insurance company does not want to pay for they Hyalgan. On 11/27/2014 the patient underwent a bilateral genicular nerve block with excellent results. On 01/28/2015 she had a right knee genicular radiofrequency ablatio  . Gout   . H/O  cardiovascular disorder 03/10/2015  . H/O surgical procedure 12/05/2012   Overview:  LSG ([facility] - April 2013)    . H/O thyroid disease 03/10/2015  . Headache   . Herpes   . History of artificial joint 07/29/2015  . History of hiatal hernia   . Hypertension   . Hypomagnesemia   . Hypothyroidism   . IDA (iron deficiency anemia) 05/28/2019  . LBP (low back pain) 07/29/2015  . Neuromuscular disorder ([place])   . Obesity   . PCOS (polycystic ovarian syndrome)   . Primary osteoarthritis of both knees 07/29/2015  . Sleep apnea    not using a Cpap machine at this time - most recent test mild apnea does not qualify for test (not OSA)  . Thyroid nodule bilateral  . Umbilical hernia     Surgical History: Past Surgical History:  Procedure Laterality Date  . APPENDECTOMY    . CARPAL TUNNEL RELEASE Bilateral   . CHOLECYSTECTOMY    . DIAGNOSTIC LAPAROSCOPY    . HIATAL HERNIA REPAIR    . JOINT REPLACEMENT Bilateral hip  . LAPAROSCOPIC PARTIAL GASTRECTOMY    . left trigger finger    . peniculectomy N/A 07/05/2018  . ROUX-EN-Y GASTRIC BYPASS  06/03/2017  . SHOULDER ARTHROSCOPY Right   . THYROIDECTOMY N/A 11/12/2015   Procedure: THYROIDECTOMY;  Surgeon: Clyde Canterbury, MD;  Location: [facility] ORS;  Service: ENT;  Laterality: N/A;  . TOTAL HIP  ARTHROPLASTY Right 11/27/2019   Procedure: TOTAL HIP ARTHROPLASTY ANTERIOR APPROACH;  Surgeon: Hessie Knows, MD;  Location: [facility] ORS;  Service: Orthopedics;  Laterality: Right;  . TRIGGER FINGER RELEASE Right   . TRIGGER FINGER RELEASE Right 04/24/2020   Procedure: Right ring and middle trigger release;  Surgeon: Hessie Knows, MD;  Location: [facility] ORS;  Service: Orthopedics;  Laterality: Right;    Home Medications:  Allergies as of 12/01/2020      Reactions   Bactrim [sulfamethoxazole-trimethoprim] Hives   Omalizumab Itching, Hives   Ciprofloxacin Other (See Comments)   myalgia   Shellfish Allergy Other (See Comments)   + positive allergy test   Nsaids Nausea  Only, Other (See Comments)   Stomach upset      Medication List       Accurate as of December 01, 2020  1:37 PM. If you have any questions, ask your nurse or doctor.        Accu-Chek Aviva Plus test strip Generic drug: glucose blood Use 2 (two) times daily   Accu-Chek Aviva Plus test strip Generic drug: glucose blood   acetaminophen 650 MG CR tablet Commonly known as: TYLENOL Take 650-1,300 mg by mouth every 8 (eight) hours as needed for pain.   acidophilus Caps capsule Take 1 capsule by mouth daily.   albuterol 108 (90 Base) MCG/ACT inhaler Commonly known as: VENTOLIN HFA Inhale 2 puffs into the lungs every 6 (six) hours as needed for wheezing or shortness of breath.   amitriptyline 100 MG tablet Commonly known as: ELAVIL Take 100 mg by mouth at bedtime.   B-12 2500 MCG Tabs Take 2,500 mcg by mouth daily.   Biotin 10 MG Caps Take 10 mg by mouth daily.   butalbital-acetaminophen-caffeine 50-325-40 MG tablet Commonly known as: FIORICET Take 1 tablet by mouth every 6 (six) hours as needed for migraine.   calcium carbonate 750 MG chewable tablet Commonly known as: TUMS EX Chew 1 tablet by mouth daily.   clonazePAM 0.5 MG tablet Commonly known as: KLONOPIN Take 1 tablet (0.5 mg total) by mouth 3 (three) times daily as needed for anxiety.   diclofenac sodium 1 % Gel Commonly known as: Voltaren Apply 2 g topically 4 (four) times daily. What changed:   when to take this  reasons to take this   diphenhydrAMINE 25 MG tablet Commonly known as: BENADRYL Take 25 mg by mouth every 8 (eight) hours as needed for itching or allergies.   Emgality 120 MG/ML Soaj Generic drug: Galcanezumab-gnlm Inject into the skin.   EPINEPHrine 0.3 mg/0.3 mL Soaj injection Commonly known as: EPI-PEN Inject 0.3 mg into the muscle as needed for anaphylaxis.   ergocalciferol 1.25 MG (50000 UT) capsule Commonly known as: VITAMIN D2 Take 50,000 Units by mouth 2 (two) times a week.    famotidine 20 MG tablet Commonly known as: PEPCID Take 20 mg by mouth at bedtime.   furosemide 20 MG tablet Commonly known as: LASIX Take 20 mg by mouth daily as needed.   levocetirizine 5 MG tablet Commonly known as: XYZAL Take 5 mg by mouth at bedtime.   levothyroxine 125 MCG tablet Commonly known as: SYNTHROID Take 250 mcg by mouth daily before breakfast.   lisinopril 5 MG tablet Commonly known as: ZESTRIL Take 5 mg by mouth at bedtime.   magnesium oxide 400 (241.3 Mg) MG tablet Commonly known as: MAG-OX SMARTSIG:1 By Mouth 4-5 Times Daily   magnesium oxide 400 MG tablet Commonly known as: MAG-OX Take 800-1,200 mg  by mouth See admin instructions. Take 1200 mg by mouth in the morning and 1200 mg at bedtime   meclizine 25 MG tablet Commonly known as: ANTIVERT Take 25 mg by mouth 3 (three) times daily as needed for dizziness.   metFORMIN 1000 MG tablet Commonly known as: GLUCOPHAGE Take 1,000 mg by mouth 2 (two) times daily with a meal.   metoprolol tartrate 50 MG tablet Commonly known as: LOPRESSOR Take 75 mg by mouth 2 (two) times daily.   montelukast 10 MG tablet Commonly known as: SINGULAIR Take 10 mg by mouth at bedtime.   naloxone 4 MG/0.1ML Liqd nasal spray kit Commonly known as: Narcan Spray into one nostril. Repeat with second device into other nostril after 2-3 minutes if no or minimal response.   naphazoline-pheniramine 0.025-0.3 % ophthalmic solution Commonly known as: NAPHCON-A Place 1 drop into both eyes 4 (four) times daily as needed for eye irritation.   naratriptan 2.5 MG tablet Commonly known as: AMERGE Take by mouth.   nitrofurantoin (macrocrystal-monohydrate) 100 MG capsule Commonly known as: Macrobid Take 1 capsule (100 mg total) by mouth daily.   nitrofurantoin 100 MG capsule Commonly known as: MACRODANTIN Take 100 mg by mouth daily. For UTI prevention   omeprazole 40 MG capsule Commonly known as: PRILOSEC Take 40 mg by mouth  in the morning and at bedtime.   oxyCODONE 5 MG immediate release tablet Commonly known as: Oxy IR/ROXICODONE Take 1 tablet (5 mg total) by mouth 2 (two) times daily. Must last 30 days Start taking on: December 24, 2020   Ozempic (0.25 or 0.5 MG/DOSE) 2 MG/1.5ML Sopn Generic drug: Semaglutide(0.25 or 0.5MG/DOS) Inject into the skin.   pregabalin 225 MG capsule Commonly known as: LYRICA Take 1 capsule (225 mg total) by mouth 2 (two) times daily for 7 days.   pregabalin 225 MG capsule Commonly known as: LYRICA Take 1 capsule (225 mg total) by mouth 2 (two) times daily.   QUEtiapine 100 MG tablet Commonly known as: SEROQUEL Take 100 mg by mouth at bedtime.   rizatriptan 10 MG disintegrating tablet Commonly known as: MAXALT-MLT Take by mouth.   rizatriptan 10 MG tablet Commonly known as: MAXALT TAKE 1 TABLET AT ONSET OF MIGRAINE; MAY REPEAT ONCE AFTER 2 HOURS IF NEEDED.   rosuvastatin 40 MG tablet Commonly known as: CRESTOR Take 40 mg by mouth daily.   sertraline 100 MG tablet Commonly known as: ZOLOFT Take 200 mg by mouth daily.   spironolactone 25 MG tablet Commonly known as: ALDACTONE Take 37.5 mg by mouth 2 (two) times daily.   Tab-A-Vite Tabs   tiZANidine 4 MG tablet Commonly known as: ZANAFLEX Take 1 tablet (4 mg total) by mouth 2 (two) times daily as needed for muscle spasms.   topiramate 50 MG tablet Commonly known as: TOPAMAX Take 50 mg by mouth 2 (two) times daily.   topiramate 100 MG tablet Commonly known as: TOPAMAX Take by mouth.   triamcinolone 55 MCG/ACT Aero nasal inhaler Commonly known as: NASACORT Place 2 sprays into the nose 2 (two) times daily as needed (allergies).   vitamin C 1000 MG tablet Take 1,000 mg by mouth daily.   zolpidem 10 MG tablet Commonly known as: AMBIEN Take 10 mg by mouth at bedtime as needed for sleep.   zonisamide 50 MG capsule Commonly known as: ZONEGRAN Take 150 mg by mouth at bedtime.       Allergies:   Allergies  Allergen Reactions  . Bactrim [Sulfamethoxazole-Trimethoprim] Hives  . Omalizumab  Itching and Hives  . Ciprofloxacin Other (See Comments)    myalgia   . Shellfish Allergy Other (See Comments)    + positive allergy test  . Nsaids Nausea Only and Other (See Comments)    Stomach upset    Family History: Family History  Problem Relation Age of Onset  . Anxiety disorder Mother   . Depression Mother   . Alcohol abuse Mother   . Diabetes Mother   . Hypertension Mother   . Kidney cancer Mother   . Sleep apnea Mother   . Alcohol abuse Father   . Anxiety disorder Father   . Depression Father   . Post-traumatic stress disorder Father   . Kidney failure Father   . COPD Father   . Diabetes Father   . Hypertension Father   . Sleep apnea Father   . Depression Brother   . Diabetes Brother   . Hypertension Brother   . Sleep apnea Brother   . Breast cancer Paternal Aunt   . Bladder Cancer Neg Hx   . Prostate cancer Neg Hx     Social History:  reports that she has never smoked. She has never used smokeless tobacco. She reports that she does not drink alcohol and does not use drugs.  ROS:                                        Physical Exam: There were no vitals taken for this visit.  Constitutional:  Alert and oriented, No acute distress. HEENT: [place] AT, moist mucus membranes.  Trachea midline, no masses. Cardiovascular: No clubbing, cyanosis, or edema.  Laboratory Data: Lab Results  Component Value Date   WBC 12.1 (H) 10/17/2020   HGB 12.9 10/17/2020   HCT 38.8 10/17/2020   MCV 85.1 10/17/2020   PLT 373 10/17/2020    Lab Results  Component Value Date   CREATININE 0.95 10/17/2020    No results found for: PSA  No results found for: TESTOSTERONE  Lab Results  Component Value Date   HGBA1C 7.1 (H) 11/27/2019    Urinalysis    Component Value Date/Time   COLORURINE AMBER (A) 12/11/2019 1700   APPEARANCEUR Clear 08/11/2020 1528    LABSPEC 1.015 12/11/2019 1700   LABSPEC 1.003 03/25/2013 2018   PHURINE 6.0 12/11/2019 1700   GLUCOSEU Negative 08/11/2020 1528   GLUCOSEU Negative 03/25/2013 2018   HGBUR NEGATIVE 12/11/2019 1700   BILIRUBINUR Negative 08/11/2020 1528   BILIRUBINUR Negative 03/25/2013 2018   KETONESUR NEGATIVE 12/11/2019 1700   PROTEINUR Negative 08/11/2020 1528   PROTEINUR 30 (A) 12/11/2019 1700   UROBILINOGEN 0.2 08/04/2020 1345   UROBILINOGEN 0.2 05/09/2015 0115   NITRITE Negative 08/11/2020 1528   NITRITE NEGATIVE 12/11/2019 1700   LEUKOCYTESUR Negative 08/11/2020 1528   LEUKOCYTESUR TRACE (A) 12/11/2019 1700   LEUKOCYTESUR 2+ 03/25/2013 2018    Pertinent Imaging:   Assessment & Plan: Prescription renewed and I will see near  There are no diagnoses linked to this encounter.  No follow-ups on file.  Reece Packer, MD  [facility] [address] [place], [place] [address] [phone]

## 2020-12-04 DIAGNOSIS — K2270 Barrett's esophagus without dysplasia: Secondary | ICD-10-CM | POA: Insufficient documentation

## 2020-12-04 DIAGNOSIS — K2100 Gastro-esophageal reflux disease with esophagitis, without bleeding: Secondary | ICD-10-CM | POA: Diagnosis not present

## 2020-12-04 DIAGNOSIS — E118 Type 2 diabetes mellitus with unspecified complications: Secondary | ICD-10-CM | POA: Diagnosis not present

## 2020-12-04 DIAGNOSIS — Z1211 Encounter for screening for malignant neoplasm of colon: Secondary | ICD-10-CM | POA: Diagnosis not present

## 2020-12-04 DIAGNOSIS — R1310 Dysphagia, unspecified: Secondary | ICD-10-CM | POA: Diagnosis not present

## 2020-12-05 DIAGNOSIS — R079 Chest pain, unspecified: Secondary | ICD-10-CM | POA: Diagnosis not present

## 2020-12-05 DIAGNOSIS — R399 Unspecified symptoms and signs involving the genitourinary system: Secondary | ICD-10-CM | POA: Diagnosis not present

## 2020-12-05 DIAGNOSIS — I249 Acute ischemic heart disease, unspecified: Secondary | ICD-10-CM | POA: Diagnosis not present

## 2020-12-05 DIAGNOSIS — R197 Diarrhea, unspecified: Secondary | ICD-10-CM | POA: Diagnosis not present

## 2020-12-18 DIAGNOSIS — Z0100 Encounter for examination of eyes and vision without abnormal findings: Secondary | ICD-10-CM | POA: Diagnosis not present

## 2020-12-18 DIAGNOSIS — H524 Presbyopia: Secondary | ICD-10-CM | POA: Diagnosis not present

## 2020-12-19 ENCOUNTER — Other Ambulatory Visit: Payer: Self-pay

## 2020-12-19 NOTE — Patient Instructions (Signed)
Goals Addressed            This Visit's Progress   . RN CM Monitor and Manage My Blood Sugar-Diabetes Type 2   On track    Timeframe:  Long-Range Goal Priority:  High Start Date:   09/30/20                          Expected End Date:      06/12/21        Follow Up Date 04/12/21   - take the blood sugar log to all doctor visits    Why is this important?    Checking your blood sugar at home helps to keep it from getting very high or very low.   Writing the results in a diary or log helps the doctor know how to care for you.   Your blood sugar log should have the time, date and the results.   Also, write down the amount of insulin or other medicine that you take.   Other information, like what you ate, exercise done and how you were feeling, will also be helpful.     Notes: 12/19/20 Continue to monitor sugars and limit carbohydrates.     . RN CM-Make and Keep All Appointments   On track    Timeframe:  Long-Range Goal Priority:  High Start Date:     09/30/20                        Expected End Date:  06/12/21            Follow Up Date 04/12/21   - call to cancel if needed    Why is this important?    Part of staying healthy is seeing the doctor for follow-up care.   If you forget your appointments, there are some things you can do to stay on track.    Notes: 12/19/20 Continue to see physicians as scheduled.

## 2020-12-19 NOTE — Patient Outreach (Signed)
[facility]) Care Management  [place]  12/19/2020   Karen Lewis Mar 23, 1971 [id number]  Subjective: Telephone call to patient for follow up. Reports she has some sinus and asthma problems today. She is on steroids and will be starting an antibiotic.  Advised if worse notify physician again. She verbalized understanding. She states her sugars are up due to steroids. Most recent A1c is 8.5 and she was started on ozempic and sugars were in the 120 range.  Advised on steroid use and sugars. She verbalized understanding.  Objective:   Encounter Medications:  Outpatient Encounter Medications as of 12/19/2020  Medication Sig Note  . ACCU-CHEK AVIVA PLUS test strip    . acetaminophen (TYLENOL) 650 MG CR tablet Take 650-1,300 mg by mouth every 8 (eight) hours as needed for pain.   Marland Kitchen acidophilus (RISAQUAD) CAPS capsule Take 1 capsule by mouth daily.   Marland Kitchen albuterol (PROVENTIL HFA;VENTOLIN HFA) 108 (90 BASE) MCG/ACT inhaler Inhale 2 puffs into the lungs every 6 (six) hours as needed for wheezing or shortness of breath.   Marland Kitchen amitriptyline (ELAVIL) 100 MG tablet Take 100 mg by mouth at bedtime.   . Ascorbic Acid (VITAMIN C) 1000 MG tablet Take 1,000 mg by mouth daily.    . Biotin 10 MG CAPS Take 10 mg by mouth daily.    . butalbital-acetaminophen-caffeine (FIORICET) 50-325-40 MG tablet Take 1 tablet by mouth every 6 (six) hours as needed for migraine.    . calcium carbonate (TUMS EX) 750 MG chewable tablet Chew 1 tablet by mouth daily.    . clonazePAM (KLONOPIN) 0.5 MG tablet Take 1 tablet (0.5 mg total) by mouth 3 (three) times daily as needed for anxiety.   . Cyanocobalamin (B-12) 2500 MCG TABS Take 2,500 mcg by mouth daily.   . diclofenac sodium (VOLTAREN) 1 % GEL Apply 2 g topically 4 (four) times daily. (Patient taking differently: Apply 2 g topically 3 (three) times daily as needed (pain).)   . diphenhydrAMINE (BENADRYL) 25 MG tablet Take 25 mg by mouth every 8 (eight) hours  as needed for itching or allergies.    Marland Kitchen EMGALITY 120 MG/ML SOAJ Inject into the skin.   Marland Kitchen EPINEPHrine 0.3 mg/0.3 mL IJ SOAJ injection Inject 0.3 mg into the muscle as needed for anaphylaxis.    Marland Kitchen ergocalciferol (VITAMIN D2) 1.25 MG (50000 UT) capsule Take 50,000 Units by mouth 2 (two) times a week.    . famotidine (PEPCID) 20 MG tablet Take 20 mg by mouth at bedtime.   . furosemide (LASIX) 20 MG tablet Take 20 mg by mouth daily as needed.   Marland Kitchen glucose blood (ACCU-CHEK AVIVA PLUS) test strip Use 2 (two) times daily   . levocetirizine (XYZAL) 5 MG tablet Take 5 mg by mouth at bedtime.    Marland Kitchen levothyroxine (SYNTHROID) 125 MCG tablet Take 250 mcg by mouth daily before breakfast.    . lisinopril (ZESTRIL) 5 MG tablet Take 5 mg by mouth at bedtime.    . magnesium oxide (MAG-OX) 400 (241.3 Mg) MG tablet SMARTSIG:1 By Mouth 4-5 Times Daily   . magnesium oxide (MAG-OX) 400 MG tablet Take 800-1,200 mg by mouth See admin instructions. Take 1200 mg by mouth in the morning and 1200 mg at bedtime   . meclizine (ANTIVERT) 25 MG tablet Take 25 mg by mouth 3 (three) times daily as needed for dizziness.    . metFORMIN (GLUCOPHAGE) 1000 MG tablet Take 1,000 mg by mouth 2 (two) times daily with  a meal.    . metoprolol tartrate (LOPRESSOR) 50 MG tablet Take 75 mg by mouth 2 (two) times daily.    . montelukast (SINGULAIR) 10 MG tablet Take 10 mg by mouth at bedtime.    . Multiple Vitamin (TAB-A-VITE) TABS    . naloxone (NARCAN) nasal spray 4 mg/0.1 mL Spray into one nostril. Repeat with second device into other nostril after 2-3 minutes if no or minimal response.   . naphazoline-pheniramine (NAPHCON-A) 0.025-0.3 % ophthalmic solution Place 1 drop into both eyes 4 (four) times daily as needed for eye irritation.   . naratriptan (AMERGE) 2.5 MG tablet Take by mouth.   . nitrofurantoin, macrocrystal-monohydrate, (MACROBID) 100 MG capsule Take 1 capsule (100 mg total) by mouth daily.   Marland Kitchen omeprazole (PRILOSEC) 40 MG capsule  Take 40 mg by mouth in the morning and at bedtime.   Derrill Memo ON 12/24/2020] oxyCODONE (OXY IR/ROXICODONE) 5 MG immediate release tablet Take 1 tablet (5 mg total) by mouth 2 (two) times daily. Must last 30 days 09/25/2020: FUTURE Prescription. (NOT a DUPLICATE!!) >>>DO NOT DELETE<<< (even if Expired!) See Care Coordination Note from [facility] (Dr. [name])   . OZEMPIC, 0.25 OR 0.5 MG/DOSE, 2 MG/1.5ML SOPN Inject into the skin.   . pregabalin (LYRICA) 225 MG capsule Take 1 capsule (225 mg total) by mouth 2 (two) times daily.   . pregabalin (LYRICA) 225 MG capsule Take 1 capsule (225 mg total) by mouth 2 (two) times daily for 7 days.   Marland Kitchen QUEtiapine (SEROQUEL) 100 MG tablet Take 100 mg by mouth at bedtime.   . rizatriptan (MAXALT) 10 MG tablet TAKE 1 TABLET AT ONSET OF MIGRAINE; MAY REPEAT ONCE AFTER 2 HOURS IF NEEDED.   . rizatriptan (MAXALT-MLT) 10 MG disintegrating tablet Take by mouth.   . rosuvastatin (CRESTOR) 40 MG tablet Take 40 mg by mouth daily.    . sertraline (ZOLOFT) 100 MG tablet Take 200 mg by mouth daily.    Marland Kitchen spironolactone (ALDACTONE) 25 MG tablet Take 37.5 mg by mouth 2 (two) times daily.    Marland Kitchen tiZANidine (ZANAFLEX) 4 MG tablet Take 1 tablet (4 mg total) by mouth 2 (two) times daily as needed for muscle spasms.   Marland Kitchen topiramate (TOPAMAX) 100 MG tablet Take by mouth.   . topiramate (TOPAMAX) 50 MG tablet Take 50 mg by mouth 2 (two) times daily.   Marland Kitchen triamcinolone (NASACORT) 55 MCG/ACT AERO nasal inhaler Place 2 sprays into the nose 2 (two) times daily as needed (allergies).    . zolpidem (AMBIEN) 10 MG tablet Take 10 mg by mouth at bedtime as needed for sleep.   Marland Kitchen zonisamide (ZONEGRAN) 50 MG capsule Take 150 mg by mouth at bedtime.     No facility-administered encounter medications on file as of 12/19/2020.    Functional Status:  In your present state of health, do you have any difficulty performing the following activities: 09/30/2020 04/18/2020  Hearing? N -  Vision? N -   Difficulty concentrating or making decisions? N -  Walking or climbing stairs? Y Y  Comment recent right hip replacement -  Dressing or bathing? Y -  Comment recent right hip replacement -  Doing errands, shopping? Y N  Comment recent hip replacement- fiance helps -  Preparing Food and eating ? Y -  Comment fiance helps -  Using the Toilet? N -  In the past six months, have you accidently leaked urine? N -  Do you have problems with loss  of bowel control? N -  Managing your Medications? N -  Managing your Finances? N -  Housekeeping or managing your Housekeeping? Y -  Comment fiance helps -  Some recent data might be hidden    Fall/Depression Screening: Fall Risk  09/30/2020 09/25/2020 06/24/2020  Falls in the past year? 0 0 0  Number falls in past yr: - - -  Comment - - -  Injury with Fall? - - -  Comment - - -  Risk Factor Category  - - -  Risk for fall due to : - - -  Risk for fall due to: Comment - - -  Follow up - - -   PHQ 2/9 Scores 09/25/2020 04/17/2020 04/08/2020 03/13/2020 12/14/2019 12/13/2019 04/27/2018  PHQ - 2 Score 0 0 0 0 4 4 0  PHQ- 9 Score - - - - 12 12 -  Exception Documentation - - - - - - -    Assessment:  Goals Addressed            This Visit's Progress   . RN CM Monitor and Manage My Blood Sugar-Diabetes Type 2   On track    Timeframe:  Long-Range Goal Priority:  High Start Date:   09/30/20                          Expected End Date:      06/12/21        Follow Up Date 04/12/21   - take the blood sugar log to all doctor visits    Why is this important?    Checking your blood sugar at home helps to keep it from getting very high or very low.   Writing the results in a diary or log helps the doctor know how to care for you.   Your blood sugar log should have the time, date and the results.   Also, write down the amount of insulin or other medicine that you take.   Other information, like what you ate, exercise done and how you were feeling, will  also be helpful.     Notes: 12/19/20 Continue to monitor sugars and limit carbohydrates.     . RN CM-Make and Keep All Appointments   On track    Timeframe:  Long-Range Goal Priority:  High Start Date:     09/30/20                        Expected End Date:  06/12/21            Follow Up Date 04/12/21   - call to cancel if needed    Why is this important?    Part of staying healthy is seeing the doctor for follow-up care.   If you forget your appointments, there are some things you can do to stay on track.    Notes: 12/19/20 Continue to see physicians as scheduled.       Plan: RN CM will follow up in July. Follow-up:  Patient agrees to Care Plan and Follow-up.   Jone Baseman, RN, MSN [facility] Care Management Coordinator Direct Line [phone] Cell [phone] Toll Free: [phone]  Fax: [phone]

## 2020-12-24 ENCOUNTER — Telehealth: Payer: Self-pay | Admitting: *Deleted

## 2020-12-24 DIAGNOSIS — E118 Type 2 diabetes mellitus with unspecified complications: Secondary | ICD-10-CM | POA: Diagnosis not present

## 2020-12-24 DIAGNOSIS — J40 Bronchitis, not specified as acute or chronic: Secondary | ICD-10-CM | POA: Diagnosis not present

## 2020-12-25 ENCOUNTER — Other Ambulatory Visit: Payer: Self-pay | Admitting: Obstetrics & Gynecology

## 2020-12-25 DIAGNOSIS — Z1231 Encounter for screening mammogram for malignant neoplasm of breast: Secondary | ICD-10-CM

## 2020-12-30 ENCOUNTER — Ambulatory Visit: Payer: Self-pay

## 2021-01-20 NOTE — Progress Notes (Signed)
PROVIDER NOTE: Information contained herein reflects review and annotations entered in association with encounter. Interpretation of such information and data should be left to medically-trained personnel. Information provided to patient can be located elsewhere in the medical record under "Patient Instructions". Document created using STT-dictation technology, any transcriptional errors that may result from process are unintentional.    Patient: Karen Lewis  Service Category: E/M  Provider: Gaspar Cola, MD  DOB: 1971-01-26  DOS: 01/21/2021  Specialty: Interventional Pain Management  MRN: [id number]  Setting: Ambulatory outpatient  PCP: Idelle Crouch, MD  Type: Established Patient    Referring Provider: Idelle Crouch, MD  Location: Office  Delivery: Face-to-face     HPI  Karen Lewis, a 50 y.o. year old female, is here today because of her Bilateral chronic knee pain [M25.561, M25.562, G89.29]. [name] primary complain today is Back Pain (low) and Knee Pain Last encounter: My last encounter with her was on 09/25/2020. Pertinent problems: Ms. Hoefer has Fibromyalgia; Chronic knee pain (1ry area of Pain) (Bilateral) (R>L); Chronic low back pain (3ry area of Pain) (Bilateral) (R>L); Lumbar facet syndrome (Bilateral) (R>L); Secondary osteoarthritis of multiple sites; Grade 1 (1.4 cm) Anterolisthesis of L4 over L5; Chronic hip pain (2ry area of Pain) (Right); S/P THR (total hip replacement) (Left); Lumbar spondylosis; Chronic pain syndrome; Neurogenic pain; Upper extremity pain; Chronic shoulder pain (Left); Osteoarthritis of shoulder (Left); Osteoarthritis of hip (Right); Secondary Osteoarthritis of knee (Bilateral) (R>L); Spondylosis without myelopathy or radiculopathy, lumbosacral region; Panniculitis; Leg swelling; Edema of both legs; History of total hip replacement (Bilateral); S/P THR (total hip replacement) (Right) (11/27/2019); Pain and numbness of left upper extremity; and Cervical  radiculitis (Left) on their pertinent problem list. Pain Assessment: Severity of Chronic pain is reported as a 4 /10. Location: Back Lower/radiates into buttocks. Onset: More than a month ago. Quality: Aching,Burning. Timing: Constant. Modifying factor(s): heat, rest, stretching, meds. Vitals:  height is '5\' 4"'  (1.626 m) and weight is 364 lb (165.1 kg) (abnormal). Her blood pressure is 112/77 and her pulse is 88. Her respiration is 16 and oxygen saturation is 98%.   Reason for encounter: medication management.   The patient indicates doing well with the current medication regimen. No adverse reactions or side effects reported to the medications.  However the patient indicates that the decrease in the medications were we went from 5/day to 2/day did not work and she is having quite a bit of pain.  She wants to go back to the same pain she had before.  Currently she is having a flareup of her low back pain as well as her bilateral lower extremity pain.  Dr. Rudene Christians indicated that she would not be a candidate for bilateral knee replacements.  She wants to have a treatment for the knee as well as the lower back, but apparently she is getting married and she refers that the facet blocks typically make her feel "down", where she has more pain for a while before he gets better, and wants to hold those for after the wedding.  However, she is interested in having the knee injections done before the wedding.  RTCB: 04/28/2021 Nonopioid transferred 06/24/2020: Lyrica  Pharmacotherapy Assessment   Analgesic: Oxycodone IR 5 mg 1 tab PO 5X/day (#150/mo) (25 mg/day) MME/day:37.5 mg/day.   Monitoring: [place] PMP: PDMP reviewed during this encounter.       Pharmacotherapy: No side-effects or adverse reactions reported. Compliance: No problems identified. Effectiveness: Clinically acceptable.  Louann Liv  Jerilynn Mages, RN  01/21/2021  1:58 PM  Signed Nursing Pain Medication Assessment:  Safety precautions to be maintained  throughout the outpatient stay will include: orient to surroundings, keep bed in low position, maintain call bell within reach at all times, provide assistance with transfer out of bed and ambulation.  Medication Inspection Compliance: Pill count conducted under aseptic conditions, in front of the patient. Neither the pills nor the bottle was removed from the patient's sight at any time. Once count was completed pills were immediately returned to the patient in their original bottle.  Medication: Oxycodone IR Pill/Patch Count: 21 of 60 pills remain Pill/Patch Appearance: Markings consistent with prescribed medication Bottle Appearance: Standard pharmacy container. Clearly labeled. Filled Date: 04/18 / 2022 Last Medication intake:  Today    UDS:  Summary  Date Value Ref Range Status  02/19/2020 Note  Final    Comment:    ==================================================================== [employer] [facility] (MW) ==================================================================== Test                             Result       Flag       Units  Drug Present and Declared for Prescription Verification   7-aminoclonazepam              44           EXPECTED   ng/mg creat    7-aminoclonazepam is an expected metabolite of clonazepam. Source of    clonazepam is a scheduled prescription medication.  Drug Present not Declared for Prescription Verification   Oxycodone                      199          UNEXPECTED ng/mg creat   Oxymorphone                    127          UNEXPECTED ng/mg creat   Noroxycodone                   1026         UNEXPECTED ng/mg creat   Noroxymorphone                 56           UNEXPECTED ng/mg creat    Sources of oxycodone are scheduled prescription medications.    Oxymorphone, noroxycodone, and noroxymorphone are expected    metabolites of oxycodone. Oxymorphone is also available as a    scheduled prescription medication.  Drug Absent but Declared for Prescription  Verification   Butalbital                     Not Detected UNEXPECTED ==================================================================== Test                      Result    Flag   Units      Ref Range   Creatinine              165              mg/dL      >=20 ==================================================================== Declared Medications:  The flagging and interpretation on this report are based on the  following declared medications.  Unexpected results may arise from  inaccuracies in the declared medications.   **Note: The testing scope of this panel includes  these medications:   Butalbital (Fioricet)  Clonazepam (Klonopin)   **Note: The testing scope of this panel does not include the  following reported medications:   Acetaminophen (Tylenol)  Acetaminophen (Fioricet)  Albuterol (Ventolin HFA)  Amitriptyline (Elavil)  Biotin  Caffeine (Fioricet)  Calcium (Tums)  Diphenhydramine (Benadryl)  Epinephrine (EpiPen)  Eye Drops  Famotidine (Pepcid)  Levocetirizine (Xyzal)  Levothyroxine (Synthroid)  Lisinopril (Zestril)  Magnesium (Mag-Ox)  Meclizine (Antivert)  Medroxyprogesterone (Depo-Provera)  Metformin (Glucophage)  Metoprolol (Lopressor)  Montelukast (Singulair)  Multivitamin  Naloxone (Narcan)  Pregabalin (Lyrica)  Probiotic  Quetiapine (Seroquel)  Rosuvastatin (Crestor)  Sertraline (Zoloft)  Spironolactone (Aldactone)  Tizanidine (Zanaflex)  Topical  Topical Diclofenac  Triamcinolone (Nasacort)  Vitamin B  Vitamin B12  Vitamin C  Vitamin D2  Zolpidem (Ambien)  Zonisamide (Zonegran) ==================================================================== For clinical consultation, please call [phone]. ====================================================================      ROS  Constitutional: Denies any fever or chills Gastrointestinal: No reported hemesis, hematochezia, vomiting, or acute GI distress Musculoskeletal: Denies any  acute onset joint swelling, redness, loss of ROM, or weakness Neurological: No reported episodes of acute onset apraxia, aphasia, dysarthria, agnosia, amnesia, paralysis, loss of coordination, or loss of consciousness  Medication Review  B-12, Biotin, Dupilumab, EPINEPHrine, Galcanezumab-gnlm, QUEtiapine, Semaglutide(0.25 or 0.5MG/DOS), Tab-A-Vite, acetaminophen, acidophilus, albuterol, amitriptyline, butalbital-acetaminophen-caffeine, calcium carbonate, clonazePAM, diclofenac sodium, diphenhydrAMINE, ergocalciferol, famotidine, furosemide, glucose blood, guaiFENesin-codeine, levocetirizine, levothyroxine, lisinopril, magnesium oxide, meclizine, metFORMIN, metoprolol tartrate, montelukast, naloxone, naphazoline-pheniramine, naratriptan, nitrofurantoin (macrocrystal-monohydrate), omeprazole, oxyCODONE, pregabalin, rizatriptan, rosuvastatin, sertraline, spironolactone, tiZANidine, topiramate, triamcinolone, vitamin C, zolpidem, and zonisamide  History Review  Allergy: Ms. Wander is allergic to bactrim [sulfamethoxazole-trimethoprim], omalizumab, ciprofloxacin, shellfish allergy, and nsaids. Drug: Ms. Cronin  reports no history of drug use. Alcohol:  reports no history of alcohol use. Tobacco:  reports that she has never smoked. She has never used smokeless tobacco. Social: Ms. Casad  reports that she has never smoked. She has never used smokeless tobacco. She reports that she does not drink alcohol and does not use drugs. Medical:  has a past medical history of Anemia, Anginal pain ([place]), Anxiety, Arthralgia of hip (07/29/2015), Arthritis, Arthritis, degenerative (07/29/2015), Asthma, Cephalalgia (07/25/2014), Dependence on unknown drug ([place]), Depression, Diabetes mellitus without complication ([place]), Difficult intubation, Dysrhythmia, Eczema, Fibromyalgia, Gastritis, GERD (gastroesophageal reflux disease), Gonalgia (07/29/2015), Gout, H/O cardiovascular disorder (03/10/2015), H/O surgical procedure (12/05/2012),  H/O thyroid disease (03/10/2015), Headache, Herpes, History of artificial joint (07/29/2015), History of hiatal hernia, Hypertension, Hypomagnesemia, Hypothyroidism, IDA (iron deficiency anemia) (05/28/2019), LBP (low back pain) (07/29/2015), Neuromuscular disorder ([place]), Obesity, PCOS (polycystic ovarian syndrome), Primary osteoarthritis of both knees (07/29/2015), Sleep apnea, Thyroid nodule (bilateral), and Umbilical hernia. Surgical: Ms. Hamed  has a past surgical history that includes Laparoscopic partial gastrectomy; Shoulder arthroscopy (Right); Carpal tunnel release (Bilateral); Diagnostic laparoscopy; Cholecystectomy; Trigger finger release (Right); Thyroidectomy (N/A, 11/12/2015); left trigger finger; Roux-en-Y Gastric Bypass (06/03/2017); Hiatal hernia repair; peniculectomy (N/A, 07/05/2018); Total hip arthroplasty (Right, 11/27/2019); Joint replacement (Bilateral, hip); Appendectomy; and Trigger finger release (Right, 04/24/2020). Family: family history includes Alcohol abuse in her father and mother; Anxiety disorder in her father and mother; Breast cancer in her paternal aunt; COPD in her father; Depression in her brother, father, and mother; Diabetes in her brother, father, and mother; Hypertension in her brother, father, and mother; Kidney cancer in her mother; Kidney failure in her father; Post-traumatic stress disorder in her father; Sleep apnea in her brother, father, and mother.  Laboratory Chemistry Profile   Renal Lab Results  Component Value Date   BUN  17 10/17/2020   CREATININE 0.95 10/17/2020   GFRAA >60 04/16/2020   GFRNONAA >60 10/17/2020     Hepatic Lab Results  Component Value Date   AST 40 10/17/2020   ALT 43 10/17/2020   ALBUMIN 3.6 10/17/2020   ALKPHOS 60 10/17/2020     Electrolytes Lab Results  Component Value Date   NA 137 10/17/2020   K 4.5 10/17/2020   CL 104 10/17/2020   CALCIUM 8.3 (L) 10/17/2020   MG 1.7 12/11/2019   PHOS 5.5 (H) 05/22/2019      Bone Lab Results  Component Value Date   VD25OH 27.4 (L) 11/24/2015   VD125OH2TOT 41.5 11/24/2015     Inflammation (CRP: Acute Phase) (ESR: Chronic Phase) Lab Results  Component Value Date   CRP 0.7 09/27/2019   ESRSEDRATE 30 (H) 09/27/2019       Note: Above Lab results reviewed.  Recent Imaging Review  MM 3D SCREEN BREAST BILATERAL CLINICAL DATA:  Screening.  EXAM: DIGITAL SCREENING BILATERAL MAMMOGRAM WITH TOMOSYNTHESIS AND CAD  TECHNIQUE: Bilateral screening digital craniocaudal and mediolateral oblique mammograms were obtained. Bilateral screening digital breast tomosynthesis was performed. The images were evaluated with computer-aided detection.  COMPARISON:  Previous exam(s).  ACR Breast Density Category b: There are scattered areas of fibroglandular density.  FINDINGS: There are no findings suspicious for malignancy. The images were evaluated with computer-aided detection.  IMPRESSION: No mammographic evidence of malignancy. A result letter of this screening mammogram will be mailed directly to the patient.  RECOMMENDATION: Screening mammogram in one year. (Code:SM-B-01Y)  BI-RADS CATEGORY  1: Negative.  Electronically Signed   By: Lillia Mountain M.D.   On: 12/01/2020 17:02 Note: Reviewed        Physical Exam  General appearance: Well nourished, well developed, and well hydrated. In no apparent acute distress Mental status: Alert, oriented x 3 (person, place, & time)       Respiratory: No evidence of acute respiratory distress Eyes: PERLA Vitals: BP 112/77 (BP Location: Left Arm, Patient Position: Sitting, Cuff Size: Large)   Pulse 88   Resp 16   Ht '5\' 4"'  (1.626 m)   Wt (!) 364 lb (165.1 kg)   SpO2 98%   BMI 62.48 kg/m  BMI: Estimated body mass index is 62.48 kg/m as calculated from the following:   Height as of this encounter: '5\' 4"'  (1.626 m).   Weight as of this encounter: 364 lb (165.1 kg). Ideal: Ideal body weight: 54.7 kg (120 lb 9.5  oz) Adjusted ideal body weight: 98.9 kg (217 lb 15.3 oz)  Assessment   Status Diagnosis  Worsened Worsening Stable 1. Chronic knee pain (1ry area of Pain) (Bilateral) (R>L)   2. Chronic low back pain (3ry area of Pain) (Bilateral) (R>L)   3. Grade 1 (1.4 cm) Anterolisthesis of L4 over L5   4. Secondary osteoarthritis of multiple sites   5. Chronic pain syndrome   6. Opiate use   7. Long term current use of opiate analgesic   8. Pharmacologic therapy   9. Chronic use of opiate for therapeutic purpose   10. Uncomplicated opioid dependence ([place])      Updated Problems: Problem  Secondary Osteoarthritis of Multiple Sites  Chronic Use of Opiate for Therapeutic Purpose    Plan of Care  Problem-specific:  No problem-specific Assessment & Plan notes found for this encounter.  Ms. EZABELLA TESKA has a current medication list which includes the following long-term medication(s): albuterol, clonazepam, diphenhydramine, furosemide, levothyroxine, lisinopril, magnesium  oxide, metformin, metoprolol tartrate, montelukast, naloxone, pregabalin, rizatriptan, rizatriptan, spironolactone, triamcinolone, zonisamide, naratriptan, [START ON 01/28/2021] oxycodone, [START ON 02/27/2021] oxycodone, and [START ON 03/29/2021] oxycodone.  Pharmacotherapy (Medications Ordered): Meds ordered this encounter  Medications  . naloxone (NARCAN) nasal spray 4 mg/0.1 mL    Sig: Place 1 spray into the nose as needed for up to 365 doses (for opioid-induced respiratory depresssion). In case of emergency (overdose), spray once into each nostril. If no response within 3 minutes, repeat application and call [phone].    Dispense:  1 each    Refill:  0    Instruct patient in proper use of device.  Marland Kitchen oxyCODONE (OXY IR/ROXICODONE) 5 MG immediate release tablet    Sig: Take 1 tablet (5 mg total) by mouth 5 (five) times daily. Must last 30 days    Dispense:  150 tablet    Refill:  0    Not a duplicate. Do NOT delete! Dispense 1 day  early if closed on refill date. Avoid benzodiazepines within 8 hours of opioids. Do not send refill requests.  Marland Kitchen oxyCODONE (OXY IR/ROXICODONE) 5 MG immediate release tablet    Sig: Take 1 tablet (5 mg total) by mouth 5 (five) times daily. Must last 30 days    Dispense:  150 tablet    Refill:  0    Not a duplicate. Do NOT delete! Dispense 1 day early if closed on refill date. Avoid benzodiazepines within 8 hours of opioids. Do not send refill requests.  Marland Kitchen oxyCODONE (OXY IR/ROXICODONE) 5 MG immediate release tablet    Sig: Take 1 tablet (5 mg total) by mouth 5 (five) times daily. Must last 30 days    Dispense:  150 tablet    Refill:  0    Not a duplicate. Do NOT delete! Dispense 1 day early if closed on refill date. Avoid benzodiazepines within 8 hours of opioids. Do not send refill requests.   Orders:  Orders Placed This Encounter  Procedures  . KNEE INJECTION    Local Anesthetic & Steroid injection.    Standing Status:   Future    Standing Expiration Date:   04/23/2021    Scheduling Instructions:     Side: Bilateral     Sedation: With Sedation.     Timeframe: As soon as schedule allows    Order Specific Question:   Where will this procedure be performed?    Answer:   [facility]   Follow-up plan:   Return for Procedure (w/ sedation): (B) IA Knee inj (STEROID).      Interventional Therapies  Risk  Complexity Considerations:   Estimated body mass index is 62.48 kg/m as calculated from the following:   Height as of this encounter: '5\' 4"'  (1.626 m).   Weight as of this encounter: 364 lb (165.1 kg). NOTE: NO RFA until BMI less or equal to 35.  (Gastric bypass done on 06/03/2017) Iodine allergy  Contrast dye allergy  Shellfish allergy    Planned  Pending:   Palliative bilateral IA steroid knee injection #1  Palliative bilateral lumbar facet block #5    Under consideration:   Diagnostic bilateral genicular nerve block    Completed:   Therapeutic bilateral IA Hyalgan  knee injection x16 (06/24/2020)  Therapeutic bilateral THR (total hip replacements) (Dr. Rudene Christians) (Right: 11/27/2019) (Left: 07/29/2015)  Therapeutic bilateral lumbar facet MBB x4 (03/07/2018)  Therapeutic right IA hip injection x3 (07/07/2017)  Therapeutic left IA shoulder (glenohumeral joint) injection x1 (05/31/2017)  Therapeutic  Palliative (PRN) options:   Palliative bilateral IA Hyalgan knee injections  Palliative bilateral lumbar facet blocks     Recent Visits No visits were found meeting these conditions. Showing recent visits within past 90 days and meeting all other requirements Today's Visits Date Type Provider Dept  01/21/21 Office Visit Milinda Pointer, MD [facility]  Showing today's visits and meeting all other requirements Future Appointments Date Type Provider Dept  01/27/21 Appointment Milinda Pointer, MD [facility]  Showing future appointments within next 90 days and meeting all other requirements  I discussed the assessment and treatment plan with the patient. The patient was provided an opportunity to ask questions and all were answered. The patient agreed with the plan and demonstrated an understanding of the instructions.  Patient advised to call back or seek an in-person evaluation if the symptoms or condition worsens.  Duration of encounter: 47 minutes.  Note by: Gaspar Cola, MD Date: 01/21/2021; Time: 3:43 PM

## 2021-01-21 ENCOUNTER — Encounter: Payer: Self-pay | Admitting: Pain Medicine

## 2021-01-21 ENCOUNTER — Ambulatory Visit: Payer: Medicare HMO | Attending: Pain Medicine | Admitting: Pain Medicine

## 2021-01-21 ENCOUNTER — Other Ambulatory Visit: Payer: Self-pay

## 2021-01-21 VITALS — BP 112/77 | HR 88 | Resp 16 | Ht 64.0 in | Wt 364.0 lb

## 2021-01-21 DIAGNOSIS — M545 Low back pain, unspecified: Secondary | ICD-10-CM | POA: Diagnosis not present

## 2021-01-21 DIAGNOSIS — F1120 Opioid dependence, uncomplicated: Secondary | ICD-10-CM | POA: Diagnosis not present

## 2021-01-21 DIAGNOSIS — M25561 Pain in right knee: Secondary | ICD-10-CM | POA: Insufficient documentation

## 2021-01-21 DIAGNOSIS — M25562 Pain in left knee: Secondary | ICD-10-CM | POA: Diagnosis not present

## 2021-01-21 DIAGNOSIS — M153 Secondary multiple arthritis: Secondary | ICD-10-CM | POA: Diagnosis not present

## 2021-01-21 DIAGNOSIS — F1190 Opioid use, unspecified, uncomplicated: Secondary | ICD-10-CM

## 2021-01-21 DIAGNOSIS — Z79899 Other long term (current) drug therapy: Secondary | ICD-10-CM | POA: Diagnosis not present

## 2021-01-21 DIAGNOSIS — G894 Chronic pain syndrome: Secondary | ICD-10-CM

## 2021-01-21 DIAGNOSIS — M4310 Spondylolisthesis, site unspecified: Secondary | ICD-10-CM | POA: Diagnosis not present

## 2021-01-21 DIAGNOSIS — G8929 Other chronic pain: Secondary | ICD-10-CM | POA: Diagnosis not present

## 2021-01-21 DIAGNOSIS — Z79891 Long term (current) use of opiate analgesic: Secondary | ICD-10-CM

## 2021-01-21 MED ORDER — OXYCODONE HCL 5 MG PO TABS: 5.0000 mg | ORAL_TABLET | Freq: Every day | ORAL | 0 refills | Status: DC

## 2021-01-21 MED ORDER — NALOXONE HCL 4 MG/0.1ML NA LIQD: 1.0000 | NASAL | 0 refills | Status: DC | PRN

## 2021-01-21 NOTE — Progress Notes (Signed)
Nursing Pain Medication Assessment:  Safety precautions to be maintained throughout the outpatient stay will include: orient to surroundings, keep bed in low position, maintain call bell within reach at all times, provide assistance with transfer out of bed and ambulation.  Medication Inspection Compliance: Pill count conducted under aseptic conditions, in front of the patient. Neither the pills nor the bottle was removed from the patient's sight at any time. Once count was completed pills were immediately returned to the patient in their original bottle.  Medication: Oxycodone IR Pill/Patch Count: 21 of 60 pills remain Pill/Patch Appearance: Markings consistent with prescribed medication Bottle Appearance: Standard pharmacy container. Clearly labeled. Filled Date: 04/18 / 2022 Last Medication intake:  Today

## 2021-01-21 NOTE — Patient Instructions (Addendum)
____________________________________________________________________________________________  Drug Holidays (Slow)  What is a "Drug Holiday"? Drug Holiday: is the name given to the period of time during which a patient stops taking a medication(s) for the purpose of eliminating tolerance to the drug.  Benefits . Improved effectiveness of opioids. . Decreased opioid dose needed to achieve benefits. . Improved pain with lesser dose.  What is tolerance? Tolerance: is the progressive decreased in effectiveness of a drug due to its repetitive use. With repetitive use, the body gets use to the medication and as a consequence, it loses its effectiveness. This is a common problem seen with opioid pain medications. As a result, a larger dose of the drug is needed to achieve the same effect that used to be obtained with a smaller dose.  How long should a "Drug Holiday" last? You should stay off of the pain medicine for at least 14 consecutive days. (2 weeks)  Should I stop the medicine "cold [place]"? No. You should always coordinate with your Pain Specialist so that he/she can provide you with the correct medication dose to make the transition as smoothly as possible.  How do I stop the medicine? Slowly. You will be instructed to decrease the daily amount of pills that you take by one (1) pill every seven (7) days. This is called a "slow downward taper" of your dose. For example: if you normally take four (4) pills per day, you will be asked to drop this dose to three (3) pills per day for seven (7) days, then to two (2) pills per day for seven (7) days, then to one (1) per day for seven (7) days, and at the end of those last seven (7) days, this is when the "Drug Holiday" would start.   Will I have withdrawals? By doing a "slow downward taper" like this one, it is unlikely that you will experience any significant withdrawal symptoms. Typically, what triggers withdrawals is the sudden stop of a high  dose opioid therapy. Withdrawals can usually be avoided by slowly decreasing the dose over a prolonged period of time. If you do not follow these instructions and decide to stop your medication abruptly, withdrawals may be possible.  What are withdrawals? Withdrawals: refers to the wide range of symptoms that occur after stopping or dramatically reducing opiate drugs after heavy and prolonged use. Withdrawal symptoms do not occur to patients that use low dose opioids, or those who take the medication sporadically. Contrary to benzodiazepine (example: Valium, Xanax, etc.) or alcohol withdrawals ("Delirium Tremens"), opioid withdrawals are not lethal. Withdrawals are the physical manifestation of the body getting rid of the excess receptors.  Expected Symptoms Early symptoms of withdrawal may include: . Agitation . Anxiety . Muscle aches . Increased tearing . Insomnia . Runny nose . Sweating . Yawning  Late symptoms of withdrawal may include: . Abdominal cramping . Diarrhea . Dilated pupils . Goose bumps . Nausea . Vomiting  Will I experience withdrawals? Due to the slow nature of the taper, it is very unlikely that you will experience any.  What is a slow taper? Taper: refers to the gradual decrease in dose.  (Last update: 04/02/2020) ____________________________________________________________________________________________    ____________________________________________________________________________________________  Medication Recommendations and Reminders  Applies to: All patients receiving prescriptions (written and/or electronic).  Medication Rules & Regulations: These rules and regulations exist for your safety and that of others. They are not flexible and neither are we. Dismissing or ignoring them will be considered "non-compliance" with medication therapy, resulting in complete  and irreversible termination of such therapy. (See document titled "Medication Rules" for  more details.) In all conscience, because of safety reasons, we cannot continue providing a therapy where the patient does not follow instructions.  Pharmacy of record:   Definition: This is the pharmacy where your electronic prescriptions will be sent.   We do not endorse any particular pharmacy, however, we have experienced problems with [facility] not securing enough medication supply for the community.  We do not restrict you in your choice of pharmacy. However, once we write for your prescriptions, we will NOT be re-sending more prescriptions to fix restricted supply problems created by your pharmacy, or your insurance.   The pharmacy listed in the electronic medical record should be the one where you want electronic prescriptions to be sent.  If you choose to change pharmacy, simply notify our nursing staff.  Recommendations:  Keep all of your pain medications in a safe place, under lock and key, even if you live alone. We will NOT replace lost, stolen, or damaged medication.  After you fill your prescription, take 1 week's worth of pills and put them away in a safe place. You should keep a separate, properly labeled bottle for this purpose. The remainder should be kept in the original bottle. Use this as your primary supply, until it runs out. Once it's gone, then you know that you have 1 week's worth of medicine, and it is time to come in for a prescription refill. If you do this correctly, it is unlikely that you will ever run out of medicine.  To make sure that the above recommendation works, it is very important that you make sure your medication refill appointments are scheduled at least 1 week before you run out of medicine. To do this in an effective manner, make sure that you do not leave the office without scheduling your next medication management appointment. Always ask the nursing staff to show you in your prescription , when your medication will be running out. Then arrange for  the receptionist to get you a return appointment, at least 7 days before you run out of medicine. Do not wait until you have 1 or 2 pills left, to come in. This is very poor planning and does not take into consideration that we may need to cancel appointments due to bad weather, sickness, or emergencies affecting our staff.  DO NOT ACCEPT A "Partial Fill": If for any reason your pharmacy does not have enough pills/tablets to completely fill or refill your prescription, do not allow for a "partial fill". The law allows the pharmacy to complete that prescription within 72 hours, without requiring a new prescription. If they do not fill the rest of your prescription within those 72 hours, you will need a separate prescription to fill the remaining amount, which we will NOT provide. If the reason for the partial fill is your insurance, you will need to talk to the pharmacist about payment alternatives for the remaining tablets, but again, DO NOT ACCEPT A PARTIAL FILL, unless you can trust your pharmacist to obtain the remainder of the pills within 72 hours.  Prescription refills and/or changes in medication(s):   Prescription refills, and/or changes in dose or medication, will be conducted only during scheduled medication management appointments. (Applies to both, written and electronic prescriptions.)  No refills on procedure days. No medication will be changed or started on procedure days. No changes, adjustments, and/or refills will be conducted on a procedure day. Doing so  will interfere with the diagnostic portion of the procedure.  No phone refills. No medications will be "called into the pharmacy".  No Fax refills.  No weekend refills.  No Holliday refills.  No after hours refills.  Remember:  Business hours are:  Monday to Thursday 8:00 AM to 4:00 PM Provider's Schedule: Milinda Pointer, MD - Appointments are:  Medication management: Monday and Wednesday 8:00 AM to 4:00 PM Procedure  day: Tuesday and Thursday 7:30 AM to 4:00 PM Gillis Santa, MD - Appointments are:  Medication management: Tuesday and Thursday 8:00 AM to 4:00 PM Procedure day: Monday and Wednesday 7:30 AM to 4:00 PM (Last update: 04/02/2020) ____________________________________________________________________________________________   ____________________________________________________________________________________________  CBD (cannabidiol) WARNING  Applicable to: All individuals currently taking or considering taking CBD (cannabidiol) and, more important, all patients taking opioid analgesic controlled substances (pain medication). (Example: oxycodone; oxymorphone; hydrocodone; hydromorphone; morphine; methadone; tramadol; tapentadol; fentanyl; buprenorphine; butorphanol; dextromethorphan; meperidine; codeine; etc.)  Legal status: CBD remains a Schedule I drug prohibited for any use. CBD is illegal with one exception. In the [place], CBD has a limited Transport planner (FDA) approval for the treatment of two specific types of epilepsy disorders. Only one CBD product has been approved by the FDA for this purpose: "Epidiolex". FDA is aware that some companies are marketing products containing cannabis and cannabis-derived compounds in ways that violate the [employer], Drug and Cosmetic Act [facility] Act) and that may put the health and safety of consumers at risk. The FDA, a Federal agency, has not enforced the CBD status since 2018.   Legality: Some manufacturers ship CBD products nationally, which is illegal. Often such products are sold online and are therefore available throughout the country. CBD is openly sold in head shops and health food stores in some states where such sales have not been explicitly legalized. Selling unapproved products with unsubstantiated therapeutic claims is not only a violation of the law, but also can put patients at risk, as these products have not been proven to  be safe or effective. Federal illegality makes it difficult to conduct research on CBD.  Reference: "FDA Regulation of Cannabis and Cannabis-Derived Products, Including Cannabidiol (CBD)" - [employer].pt  Warning: CBD is not FDA approved and has not undergo the same manufacturing controls as prescription drugs.  This means that the purity and safety of available CBD may be questionable. Most of the time, despite manufacturer's claims, it is contaminated with THC (delta-9-tetrahydrocannabinol - the chemical in marijuana responsible for the "HIGH").  When this is the case, the [facility] contaminant will trigger a positive urine drug screen (UDS) test for Marijuana (carboxy-THC). Because a positive UDS for any illicit substance is a violation of our medication agreement, your opioid analgesics (pain medicine) may be permanently discontinued.  MORE ABOUT CBD  General Information: CBD  is a derivative of the Marijuana (cannabis sativa) plant discovered in 85. It is one of the 113 identified substances found in Marijuana. It accounts for up to 40% of the plant's extract. As of 2018, preliminary clinical studies on CBD included research for the treatment of anxiety, movement disorders, and pain. CBD is available and consumed in multiple forms, including inhalation of smoke or vapor, as an aerosol spray, and by mouth. It may be supplied as an oil containing CBD, capsules, dried cannabis, or as a liquid solution. CBD is thought not to be as psychoactive as THC (delta-9-tetrahydrocannabinol - the chemical in marijuana responsible for the "HIGH"). Studies suggest that CBD may interact  with different biological target receptors in the body, including cannabinoid and other neurotransmitter receptors. As of 2018 the mechanism of action for its biological effects has not been determined.  Side-effects   Adverse reactions: Dry mouth, diarrhea, decreased appetite, fatigue, drowsiness, malaise, weakness, sleep disturbances, and others.  Drug interactions: CBC may interact with other medications such as blood-thinners. (Last update: 04/19/2020) ____________________________________________________________________________________________   ____________________________________________________________________________________________  Medication Rules  Purpose: To inform patients, and their family members, of our rules and regulations.  Applies to: All patients receiving prescriptions (written or electronic).  Pharmacy of record: Pharmacy where electronic prescriptions will be sent. If written prescriptions are taken to a different pharmacy, please inform the nursing staff. The pharmacy listed in the electronic medical record should be the one where you would like electronic prescriptions to be sent.  Electronic prescriptions: In compliance with the [place] (STOP) Act of 2017 (Session Lanny Cramp [id number]), effective September 13, 2018, all controlled substances must be electronically prescribed. Calling prescriptions to the pharmacy will cease to exist.  Prescription refills: Only during scheduled appointments. Applies to all prescriptions.  NOTE: The following applies primarily to controlled substances (Opioid* Pain Medications).   Type of encounter (visit): For patients receiving controlled substances, face-to-face visits are required. (Not an option or up to the patient.)  Patient's responsibilities: 1. Pain Pills: Bring all pain pills to every appointment (except for procedure appointments). 2. Pill Bottles: Bring pills in original pharmacy bottle. Always bring the newest bottle. Bring bottle, even if empty. 3. Medication refills: You are responsible for knowing and keeping track of what medications you take and those you need refilled. The day before  your appointment: write a list of all prescriptions that need to be refilled. The day of the appointment: give the list to the admitting nurse. Prescriptions will be written only during appointments. No prescriptions will be written on procedure days. If you forget a medication: it will not be "Called in", "Faxed", or "electronically sent". You will need to get another appointment to get these prescribed. No early refills. Do not call asking to have your prescription filled early. 4. Prescription Accuracy: You are responsible for carefully inspecting your prescriptions before leaving our office. Have the discharge nurse carefully go over each prescription with you, before taking them home. Make sure that your name is accurately spelled, that your address is correct. Check the name and dose of your medication to make sure it is accurate. Check the number of pills, and the written instructions to make sure they are clear and accurate. Make sure that you are given enough medication to last until your next medication refill appointment. 5. Taking Medication: Take medication as prescribed. When it comes to controlled substances, taking less pills or less frequently than prescribed is permitted and encouraged. Never take more pills than instructed. Never take medication more frequently than prescribed.  6. Inform other Doctors: Always inform, all of your healthcare providers, of all the medications you take. 7. Pain Medication from other Providers: You are not allowed to accept any additional pain medication from any other Doctor or Healthcare provider. There are two exceptions to this rule. (see below) In the event that you require additional pain medication, you are responsible for notifying us, as stated below. 8. Cough Medicine: Often these contain an opioid, such as codeine or hydrocodone. Never accept or take cough medicine containing these opioids if you are already taking an opioid* medication. The  combination may cause respiratory failure and  death. 9. Medication Agreement: You are responsible for carefully reading and following our Medication Agreement. This must be signed before receiving any prescriptions from our practice. Safely store a copy of your signed Agreement. Violations to the Agreement will result in no further prescriptions. (Additional copies of our Medication Agreement are available upon request.) 10. Laws, Rules, & Regulations: All patients are expected to follow all Federal and [employer], [employer], Rules, [employer]. Ignorance of the Laws does not constitute a valid excuse.  11. Illegal drugs and Controlled Substances: The use of illegal substances (including, but not limited to marijuana and its derivatives) and/or the illegal use of any controlled substances is strictly prohibited. Violation of this rule may result in the immediate and permanent discontinuation of any and all prescriptions being written by our practice. The use of any illegal substances is prohibited. 12. Adopted CDC guidelines & recommendations: Target dosing levels will be at or below 60 MME/day. Use of benzodiazepines** is not recommended.  Exceptions: There are only two exceptions to the rule of not receiving pain medications from other Healthcare Providers. 1. Exception #1 (Emergencies): In the event of an emergency (i.e.: accident requiring emergency care), you are allowed to receive additional pain medication. However, you are responsible for: As soon as you are able, call our office [phone], at any time of the day or night, and leave a message stating your name, the date and nature of the emergency, and the name and dose of the medication prescribed. In the event that your call is answered by a member of our staff, make sure to document and save the date, time, and the name of the person that took your information.  2. Exception #2 (Planned Surgery): In the event that you are scheduled by  another doctor or dentist to have any type of surgery or procedure, you are allowed (for a period no longer than 30 days), to receive additional pain medication, for the acute post-op pain. However, in this case, you are responsible for picking up a copy of our "Post-op Pain Management for Surgeons" handout, and giving it to your surgeon or dentist. This document is available at our office, and does not require an appointment to obtain it. Simply go to our office during business hours (Monday-Thursday from 8:00 AM to 4:00 PM) (Friday 8:00 AM to 12:00 Noon) or if you have a scheduled appointment with [place], prior to your surgery, and ask for it by name. In addition, you are responsible for: calling our office [phone], at any time of the day or night, and leaving a message stating your name, name of your surgeon, type of surgery, and date of procedure or surgery. Failure to comply with your responsibilities may result in termination of therapy involving the controlled substances.  *Opioid medications include: morphine, codeine, oxycodone, oxymorphone, hydrocodone, hydromorphone, meperidine, tramadol, tapentadol, buprenorphine, fentanyl, methadone. **Benzodiazepine medications include: diazepam (Valium), alprazolam (Xanax), clonazepam (Klonopine), lorazepam (Ativan), clorazepate (Tranxene), chlordiazepoxide (Librium), estazolam (Prosom), oxazepam (Serax), temazepam (Restoril), triazolam (Halcion) (Last updated: 08/11/2020) ____________________________________________________________________________________________    ______________________________________________________________________________________________  Body mass index (BMI)  Body mass index (BMI) is a common tool for deciding whether a person has an appropriate body weight.  It measures a persons weight in relation to their height.   According to the [employer] of health (NIH): Marland Kitchen A BMI of less than 18.5 means that a person is  underweight. . A BMI of between 18.5 and 24.9 is ideal. . A BMI of between 25 and  29.9 is overweight. . A BMI over 30 indicates obesity.  Weight Management Required  URGENT: Your weight has been found to be adversely affecting your health.  Dear Ms. Minjares:  Your current Estimated body mass index is 61.28 kg/m as calculated from the following:   Height as of 09/25/20: 5' 4" (1.626 m).   Weight as of 10/20/20: 357 lb (161.9 kg).  Please use the table below to identify your weight category and associated incidence of chronic pain, secondary to your weight.  Body Mass Index (BMI) Classification BMI level (kg/m2) Category Associated incidence of chronic pain  <18  Underweight   18.5-24.9 Ideal body weight   25-29.9 Overweight  20%  30-34.9 Obese (Class I)  68%  35-39.9 Severe obesity (Class II)  136%  >40 Extreme obesity (Class III)  254%   In addition: You will be considered "Morbidly Obese", if your BMI is above 30 and you have one or more of the following conditions which are known to be caused and/or directly associated with obesity: 1.    Type 2 Diabetes (Which in turn can lead to cardiovascular diseases (CVD), stroke, peripheral vascular diseases (PVD), retinopathy, nephropathy, and neuropathy) 2.    Cardiovascular Disease (High Blood Pressure; Congestive Heart Failure; High Cholesterol; Coronary Artery Disease; Angina; or History of Heart Attacks) 3.    Breathing problems (Asthma; obesity-hypoventilation syndrome; obstructive sleep apnea; chronic inflammatory airway disease; reactive airway disease; or shortness of breath) 4.    Chronic kidney disease 5.    Liver disease (nonalcoholic fatty liver disease) 6.    High blood pressure 7.    Acid reflux (gastroesophageal reflux disease; heartburn) 8.    Osteoarthritis (OA) (with any of the following: hip pain; knee pain; and/or low back pain) 9.    Low back pain (Lumbar Facet Syndrome; and/or Degenerative Disc Disease) 10.  Hip pain  (Osteoarthritis of hip) (For every 1 lbs of added body weight, there is a 2 lbs increase in pressure inside of each hip articulation. 1:2 mechanical relationship) 11.  Knee pain (Osteoarthritis of knee) (For every 1 lbs of added body weight, there is a 4 lbs increase in pressure inside of each knee articulation. 1:4 mechanical relationship) (patients with a BMI>30 kg/m2 were 6.8 times more likely to develop knee OA than normal-weight individuals) 12.  Cancer: Epidemiological studies have shown that obesity is a risk factor for: post-menopausal breast cancer; cancers of the endometrium, colon and kidney cancer; malignant adenomas of the oesophagus. Obese subjects have an approximately 1.5-3.5-fold increased risk of developing these cancers compared with normal-weight subjects, and it has been estimated that between 15 and 45% of these cancers can be attributed to overweight. More recent studies suggest that obesity may also increase the risk of other types of cancer, including pancreatic, hepatic and gallbladder cancer. (Ref: Obesity and cancer. Pischon T, Nthlings U, [employer] H. Proc Nutr Soc. 2008 May;67(2):128-45. doi: 35.3299/M4268341962229798.) The International Agency for Research on Cancer (IARC) has identified 13 cancers associated with overweight and obesity: meningioma, multiple myeloma, adenocarcinoma of the esophagus, and cancers of the thyroid, postmenopausal breast cancer, gallbladder, stomach, liver, pancreas, kidney, ovaries, uterus, colon and rectal (colorectal) cancers. 38 percent of all cancers diagnosed in women and 24 percent of those diagnosed in men are associated with overweight and obesity.  Recommendation: At this point it is urgent that you take a step back and concentrate in loosing weight. Dedicate 100% of your efforts on this task. Nothing else will improve your health more than  bringing your weight down and your BMI to less than 30. If you are here, you probably have chronic pain. We  know that most chronic pain patients have difficulty exercising secondary to their pain. For this reason, you must rely on proper nutrition and diet in order to lose the weight. If your BMI is above 40, you should seriously consider bariatric surgery. A realistic goal is to lose 10% of your body weight over a period of 12 months.  Be honest to yourself, if over time you have unsuccessfully tried to lose weight, then it is time for you to seek professional help and to enter a medically supervised weight management program, and/or undergo bariatric surgery. Stop procrastinating.   Pain management considerations:  1.    Pharmacological Problems: Be advised that the use of opioid analgesics (oxycodone; hydrocodone; morphine; methadone; codeine; and all of their derivatives) have been associated with decreased metabolism and weight gain.  For this reason, should we see that you are unable to lose weight while taking these medications, it may become necessary for [place] to taper down and indefinitely discontinue them.  2.    Technical Problems: The incidence of successful interventional therapies decreases as the patient's BMI increases. It is much more difficult to accomplish a safe and effective interventional therapy on a patient with a BMI above 35. 3.    Radiation Exposure Problems: The x-rays machine, used to accomplish injection therapies, will automatically increase their x-ray output in order to capture an appropriate bone image. This means that radiation exposure increases exponentially with the patient's BMI. (The higher the BMI, the higher the radiation exposure.) Although the level of radiation used at a given time is still safe to the patient, it is not for the physician and/or assisting staff. Unfortunately, radiation exposure is accumulative. Because physicians and the staff have to do procedures and be exposed on a daily basis, this can result in health problems such as cancer and radiation burns.  Radiation exposure to the staff is monitored by the radiation batches that they wear. The exposure levels are reported back to the staff on a quarterly basis. Depending on levels of exposure, physicians and staff may be obligated by law to decrease this exposure. This means that they have the right and obligation to refuse providing therapies where they may be overexposed to radiation. For this reason, physicians may decline to offer therapies such as radiofrequency ablation or implants to patients with a BMI above 40. 4.    Current Trends: Be advised that the current trend is to no longer offer certain therapies to patients with a BMI equal to, or above 35, due to increase perioperative risks, increased technical procedural difficulties, and excessive radiation exposure to healthcare personnel.  ______________________________________________________________________________________________    Naloxone nasal spray What is this medicine? NALOXONE (nal OX one) is a narcotic blocker. It is used to treat narcotic drug overdose. This medicine may be used for other purposes; ask your health care provider or pharmacist if you have questions. COMMON BRAND NAME(S): Kloxxado, Narcan What should I tell my health care provider before I take this medicine? They need to know if you have any of these conditions:  drug abuse or addiction  heart disease  an unusual or allergic reaction to naloxone, other medicines, foods, dyes, or preservatives  pregnant or trying to get pregnant  breast-feeding How should I use this medicine? This medicine is for use in the nose. Lay the person on their back. Support their neck  with your hand and allow the head to tilt back before giving the medicine. The nasal spray should be given into 1 nostril. After giving the medicine, move the person onto their side. Do not remove or test the nasal spray until ready to use. Get emergency medical help right away after giving the first  dose of this medicine, even if the person wakes up. You should be familiar with how to recognize the signs and symptoms of a narcotic overdose. If more doses are needed, give the additional dose in the other nostril. Talk to your pediatrician regarding the use of this medicine in children. While this drug may be prescribed for children as young as newborns for selected conditions, precautions do apply. Overdosage: If you think you have taken too much of this medicine contact a poison control center or emergency room at once. NOTE: This medicine is only for you. Do not share this medicine with others. What if I miss a dose? This does not apply. What may interact with this medicine? This medicine is only used during an emergency. No interactions are expected during emergency use. This list may not describe all possible interactions. Give your health care provider a list of all the medicines, herbs, non-prescription drugs, or dietary supplements you use. Also tell them if you smoke, drink alcohol, or use illegal drugs. Some items may interact with your medicine. What should I watch for while using this medicine? Keep this medicine ready for use in the case of a narcotic overdose. Make sure that you have the phone number of your doctor or health care professional and local hospital ready. You may need to have additional doses of this medicine. Each nasal spray contains a single dose. Some emergencies may require additional doses. After use, bring the treated person to the nearest hospital or call 911. Make sure the treating health care professional knows that the person has received an injection of this medicine. You will receive additional instructions on what to do during and after use of this medicine before an emergency occurs. What side effects may I notice from receiving this medicine? Side effects that you should report to your doctor or health care professional as soon as possible:  allergic  reactions like skin rash, itching or hives, swelling of the face, lips, or tongue  breathing problems  fast, irregular heartbeat  high blood pressure  pain that was controlled by narcotic pain medicine  seizures Side effects that usually do not require medical attention (report to your doctor or health care professional if they continue or are bothersome):  anxious  chills  diarrhea  fever  headache  muscle pain  nausea, vomiting  nose irritation like dryness, congestion, and swelling  sweating This list may not describe all possible side effects. Call your doctor for medical advice about side effects. You may report side effects to FDA at 1-800-FDA-1088. Where should I keep my medicine? Keep out of the reach of children and pets. Store between 20 and 25 degrees C (68 and 77 degrees F). Do not freeze. Throw away any unused medicine after the expiration date. Keep in original box until ready to use. NOTE: This sheet is a summary. It may not cover all possible information. If you have questions about this medicine, talk to your doctor, pharmacist, or health care provider.  2021 Elsevier/Gold Standard (2020-01-15 12:08:55)  ____________________________________________________________________________________________  Preparing for Procedure with Sedation  Procedure appointments are limited to planned procedures: . No Prescription Refills. . No disability  issues will be discussed. . No medication changes will be discussed.  Instructions: . Oral Intake: Do not eat or drink anything for at least 8 hours prior to your procedure. (Exception: Blood Pressure Medication. See below.) . Transportation: Unless otherwise stated by your physician, you may drive yourself after the procedure. . Blood Pressure Medicine: Do not forget to take your blood pressure medicine with a sip of water the morning of the procedure. If your Diastolic (lower reading)is above 100 mmHg, elective cases  will be cancelled/rescheduled. . Blood thinners: These will need to be stopped for procedures. Notify our staff if you are taking any blood thinners. Depending on which one you take, there will be specific instructions on how and when to stop it. . Diabetics on insulin: Notify the staff so that you can be scheduled 1st case in the morning. If your diabetes requires high dose insulin, take only  of your normal insulin dose the morning of the procedure and notify the staff that you have done so. . Preventing infections: Shower with an antibacterial soap the morning of your procedure. . Build-up your immune system: Take 1000 mg of Vitamin C with every meal (3 times a day) the day prior to your procedure. Marland Kitchen Antibiotics: Inform the staff if you have a condition or reason that requires you to take antibiotics before dental procedures. . Pregnancy: If you are pregnant, call and cancel the procedure. . Sickness: If you have a cold, fever, or any active infections, call and cancel the procedure. . Arrival: You must be in the facility at least 30 minutes prior to your scheduled procedure. . Children: Do not bring children with you. . Dress appropriately: Bring dark clothing that you would not mind if they get stained. . Valuables: Do not bring any jewelry or valuables.  Reasons to call and reschedule or cancel your procedure: (Following these recommendations will minimize the risk of a serious complication.) . Surgeries: Avoid having procedures within 2 weeks of any surgery. (Avoid for 2 weeks before or after any surgery). . Flu Shots: Avoid having procedures within 2 weeks of a flu shots or . (Avoid for 2 weeks before or after immunizations). . Barium: Avoid having a procedure within 7-10 days after having had a radiological study involving the use of radiological contrast. (Myelograms, Barium swallow or enema study). . Heart attacks: Avoid any elective procedures or surgeries for the initial 6 months after  a "Myocardial Infarction" (Heart Attack). . Blood thinners: It is imperative that you stop these medications before procedures. Let us know if you if you take any blood thinner.  . Infection: Avoid procedures during or within two weeks of an infection (including chest colds or gastrointestinal problems). Symptoms associated with infections include: Localized redness, fever, chills, night sweats or profuse sweating, burning sensation when voiding, cough, congestion, stuffiness, runny nose, sore throat, diarrhea, nausea, vomiting, cold or Flu symptoms, recent or current infections. It is specially important if the infection is over the area that we intend to treat. Marland Kitchen Heart and lung problems: Symptoms that may suggest an active cardiopulmonary problem include: cough, chest pain, breathing difficulties or shortness of breath, dizziness, ankle swelling, uncontrolled high or unusually low blood pressure, and/or palpitations. If you are experiencing any of these symptoms, cancel your procedure and contact your primary care physician for an evaluation.  Remember:  Regular Business hours are:  Monday to Thursday 8:00 AM to 4:00 PM  Provider's Schedule: Milinda Pointer, MD:  Procedure days: Tuesday and Thursday  7:30 AM to 4:00 PM  Gillis Santa, MD:  Procedure days: Monday and Wednesday 7:30 AM to 4:00 PM ____________________________________________________________________________________________   ____________________________________________________________________________________________  General Risks and Possible Complications  Patient Responsibilities: It is important that you read this as it is part of your informed consent. It is our duty to inform you of the risks and possible complications associated with treatments offered to you. It is your responsibility as a patient to read this and to ask questions about anything that is not clear or that you believe was not covered in this  document.  Patient's Rights: You have the right to refuse treatment. You also have the right to change your mind, even after initially having agreed to have the treatment done. However, under this last option, if you wait until the last second to change your mind, you may be charged for the materials used up to that point.  Introduction: Medicine is not an Chief Strategy Officer. Everything in Medicine, including the lack of treatment(s), carries the potential for danger, harm, or loss (which is by definition: Risk). In Medicine, a complication is a secondary problem, condition, or disease that can aggravate an already existing one. All treatments carry the risk of possible complications. The fact that a side effects or complications occurs, does not imply that the treatment was conducted incorrectly. It must be clearly understood that these can happen even when everything is done following the highest safety standards.  No treatment: You can choose not to proceed with the proposed treatment alternative. The "PRO(s)" would include: avoiding the risk of complications associated with the therapy. The "CON(s)" would include: not getting any of the treatment benefits. These benefits fall under one of three categories: diagnostic; therapeutic; and/or palliative. Diagnostic benefits include: getting information which can ultimately lead to improvement of the disease or symptom(s). Therapeutic benefits are those associated with the successful treatment of the disease. Finally, palliative benefits are those related to the decrease of the primary symptoms, without necessarily curing the condition (example: decreasing the pain from a flare-up of a chronic condition, such as incurable terminal cancer).  General Risks and Complications: These are associated to most interventional treatments. They can occur alone, or in combination. They fall under one of the following six (6) categories: no benefit or worsening of symptoms;  bleeding; infection; nerve damage; allergic reactions; and/or death. 1. No benefits or worsening of symptoms: In Medicine there are no guarantees, only probabilities. No healthcare provider can ever guarantee that a medical treatment will work, they can only state the probability that it may. Furthermore, there is always the possibility that the condition may worsen, either directly, or indirectly, as a consequence of the treatment. 2. Bleeding: This is more common if the patient is taking a blood thinner, either prescription or over the counter (example: Goody Powders, Fish oil, Aspirin, Garlic, etc.), or if suffering a condition associated with impaired coagulation (example: Hemophilia, cirrhosis of the liver, low platelet counts, etc.). However, even if you do not have one on these, it can still happen. If you have any of these conditions, or take one of these drugs, make sure to notify your treating physician. 3. Infection: This is more common in patients with a compromised immune system, either due to disease (example: diabetes, cancer, human immunodeficiency virus [HIV], etc.), or due to medications or treatments (example: therapies used to treat cancer and rheumatological diseases). However, even if you do not have one on these, it can still happen. If you have any of  these conditions, or take one of these drugs, make sure to notify your treating physician. 4. Nerve Damage: This is more common when the treatment is an invasive one, but it can also happen with the use of medications, such as those used in the treatment of cancer. The damage can occur to small secondary nerves, or to large primary ones, such as those in the spinal cord and brain. This damage may be temporary or permanent and it may lead to impairments that can range from temporary numbness to permanent paralysis and/or brain death. 5. Allergic Reactions: Any time a substance or material comes in contact with our body, there is the  possibility of an allergic reaction. These can range from a mild skin rash (contact dermatitis) to a severe systemic reaction (anaphylactic reaction), which can result in death. 6. Death: In general, any medical intervention can result in death, most of the time due to an unforeseen complication. ____________________________________________________________________________________________

## 2021-01-23 DIAGNOSIS — E1142 Type 2 diabetes mellitus with diabetic polyneuropathy: Secondary | ICD-10-CM | POA: Diagnosis not present

## 2021-01-27 ENCOUNTER — Ambulatory Visit: Payer: Medicare HMO | Admitting: Pain Medicine

## 2021-01-27 NOTE — Progress Notes (Deleted)
No show

## 2021-02-11 ENCOUNTER — Ambulatory Visit: Payer: Medicare HMO | Admitting: Anesthesiology

## 2021-02-11 ENCOUNTER — Ambulatory Visit
Admission: RE | Admit: 2021-02-11 | Discharge: 2021-02-11 | Disposition: A | Discharge status: Home / Self Care | Payer: Medicare HMO | Attending: Internal Medicine | Admitting: Internal Medicine

## 2021-02-11 ENCOUNTER — Encounter
Admission: RE | Disposition: A | Discharge status: Home / Self Care | Payer: Self-pay | Source: Home / Self Care | Attending: Internal Medicine

## 2021-02-11 ENCOUNTER — Encounter: Payer: Self-pay | Admitting: Internal Medicine

## 2021-02-11 DIAGNOSIS — Z8616 Personal history of COVID-19: Secondary | ICD-10-CM | POA: Diagnosis not present

## 2021-02-11 DIAGNOSIS — I152 Hypertension secondary to endocrine disorders: Secondary | ICD-10-CM | POA: Diagnosis not present

## 2021-02-11 DIAGNOSIS — Z79890 Hormone replacement therapy (postmenopausal): Secondary | ICD-10-CM | POA: Insufficient documentation

## 2021-02-11 DIAGNOSIS — Z886 Allergy status to analgesic agent status: Secondary | ICD-10-CM | POA: Insufficient documentation

## 2021-02-11 DIAGNOSIS — K2100 Gastro-esophageal reflux disease with esophagitis, without bleeding: Secondary | ICD-10-CM | POA: Insufficient documentation

## 2021-02-11 DIAGNOSIS — Z8719 Personal history of other diseases of the digestive system: Secondary | ICD-10-CM | POA: Insufficient documentation

## 2021-02-11 DIAGNOSIS — E1169 Type 2 diabetes mellitus with other specified complication: Secondary | ICD-10-CM | POA: Diagnosis not present

## 2021-02-11 DIAGNOSIS — Z6841 Body Mass Index (BMI) 40.0 and over, adult: Secondary | ICD-10-CM | POA: Insufficient documentation

## 2021-02-11 DIAGNOSIS — K640 First degree hemorrhoids: Secondary | ICD-10-CM | POA: Diagnosis not present

## 2021-02-11 DIAGNOSIS — Z79891 Long term (current) use of opiate analgesic: Secondary | ICD-10-CM | POA: Diagnosis not present

## 2021-02-11 DIAGNOSIS — K2270 Barrett's esophagus without dysplasia: Secondary | ICD-10-CM | POA: Insufficient documentation

## 2021-02-11 DIAGNOSIS — E669 Obesity, unspecified: Secondary | ICD-10-CM | POA: Diagnosis not present

## 2021-02-11 DIAGNOSIS — E785 Hyperlipidemia, unspecified: Secondary | ICD-10-CM | POA: Diagnosis not present

## 2021-02-11 DIAGNOSIS — Z1211 Encounter for screening for malignant neoplasm of colon: Secondary | ICD-10-CM | POA: Diagnosis not present

## 2021-02-11 DIAGNOSIS — Z1289 Encounter for screening for malignant neoplasm of other sites: Secondary | ICD-10-CM | POA: Insufficient documentation

## 2021-02-11 DIAGNOSIS — K314 Gastric diverticulum: Secondary | ICD-10-CM | POA: Diagnosis not present

## 2021-02-11 DIAGNOSIS — Z980 Intestinal bypass and anastomosis status: Secondary | ICD-10-CM | POA: Diagnosis not present

## 2021-02-11 DIAGNOSIS — Z7984 Long term (current) use of oral hypoglycemic drugs: Secondary | ICD-10-CM | POA: Diagnosis not present

## 2021-02-11 DIAGNOSIS — Z9884 Bariatric surgery status: Secondary | ICD-10-CM | POA: Diagnosis not present

## 2021-02-11 DIAGNOSIS — K219 Gastro-esophageal reflux disease without esophagitis: Secondary | ICD-10-CM | POA: Diagnosis not present

## 2021-02-11 DIAGNOSIS — R1313 Dysphagia, pharyngeal phase: Secondary | ICD-10-CM | POA: Diagnosis not present

## 2021-02-11 DIAGNOSIS — Z881 Allergy status to other antibiotic agents status: Secondary | ICD-10-CM | POA: Diagnosis not present

## 2021-02-11 DIAGNOSIS — Z792 Long term (current) use of antibiotics: Secondary | ICD-10-CM | POA: Insufficient documentation

## 2021-02-11 DIAGNOSIS — E890 Postprocedural hypothyroidism: Secondary | ICD-10-CM | POA: Diagnosis not present

## 2021-02-11 DIAGNOSIS — Z79899 Other long term (current) drug therapy: Secondary | ICD-10-CM | POA: Diagnosis not present

## 2021-02-11 DIAGNOSIS — E1159 Type 2 diabetes mellitus with other circulatory complications: Secondary | ICD-10-CM | POA: Diagnosis not present

## 2021-02-11 DIAGNOSIS — E209 Hypoparathyroidism, unspecified: Secondary | ICD-10-CM | POA: Diagnosis not present

## 2021-02-11 DIAGNOSIS — K648 Other hemorrhoids: Secondary | ICD-10-CM | POA: Diagnosis not present

## 2021-02-11 HISTORY — PX: ESOPHAGOGASTRODUODENOSCOPY (EGD) WITH PROPOFOL: SHX5813

## 2021-02-11 HISTORY — PX: COLONOSCOPY WITH PROPOFOL: SHX5780

## 2021-02-11 LAB — GLUCOSE, CAPILLARY: Glucose-Capillary: 141 mg/dL — ABNORMAL HIGH (ref 70–99)

## 2021-02-11 LAB — POCT PREGNANCY, URINE: Preg Test, Ur: NEGATIVE

## 2021-02-11 SURGERY — COLONOSCOPY WITH PROPOFOL
Anesthesia: General

## 2021-02-11 MED ORDER — KETAMINE HCL 50 MG/ML IJ SOLN
INTRAMUSCULAR | Status: DC | PRN
  Administered 2021-02-11: 10 mg via INTRAMUSCULAR
  Administered 2021-02-11: 30 mg via INTRAMUSCULAR

## 2021-02-11 MED ORDER — PHENYLEPHRINE HCL (PRESSORS) 10 MG/ML IV SOLN
INTRAVENOUS | Status: DC | PRN
  Administered 2021-02-11: 100 ug via INTRAVENOUS

## 2021-02-11 MED ORDER — SODIUM CHLORIDE 0.9 % IV SOLN
INTRAVENOUS | Status: DC
  Administered 2021-02-11: 1000 mL via INTRAVENOUS

## 2021-02-11 MED ORDER — DEXMEDETOMIDINE (PRECEDEX) IN NS 20 MCG/5ML (4 MCG/ML) IV SYRINGE
PREFILLED_SYRINGE | INTRAVENOUS | Status: DC | PRN
  Administered 2021-02-11: 8 ug via INTRAVENOUS
  Administered 2021-02-11 (×2): 4 ug via INTRAVENOUS

## 2021-02-11 MED ORDER — PROPOFOL 10 MG/ML IV BOLUS
INTRAVENOUS | Status: DC | PRN
  Administered 2021-02-11: 30 mg via INTRAVENOUS
  Administered 2021-02-11: 10 mg via INTRAVENOUS

## 2021-02-11 MED ORDER — LIDOCAINE HCL (CARDIAC) PF 100 MG/5ML IV SOSY
PREFILLED_SYRINGE | INTRAVENOUS | Status: DC | PRN
  Administered 2021-02-11: 100 mg via INTRAVENOUS

## 2021-02-11 MED ORDER — KETAMINE HCL 50 MG/ML IJ SOLN
INTRAMUSCULAR | Status: AC
  Filled 2021-02-11: qty 1

## 2021-02-11 MED ORDER — DEXMEDETOMIDINE (PRECEDEX) IN NS 20 MCG/5ML (4 MCG/ML) IV SYRINGE
PREFILLED_SYRINGE | INTRAVENOUS | Status: AC
  Filled 2021-02-11: qty 5

## 2021-02-11 MED ORDER — GLYCOPYRROLATE 0.2 MG/ML IJ SOLN
INTRAMUSCULAR | Status: DC | PRN
  Administered 2021-02-11: .2 mg via INTRAVENOUS

## 2021-02-11 MED ORDER — PROPOFOL 500 MG/50ML IV EMUL
INTRAVENOUS | Status: DC | PRN
  Administered 2021-02-11: 70 ug/kg/min via INTRAVENOUS

## 2021-02-11 NOTE — Transfer of Care (Signed)
Immediate Anesthesia Transfer of Care Note  Patient: RIONA LAHTI  Procedure(s) Performed: COLONOSCOPY WITH PROPOFOL (N/A ) ESOPHAGOGASTRODUODENOSCOPY (EGD) WITH PROPOFOL (N/A )  Patient Location: Endoscopy Unit  Anesthesia Type:General  Level of Consciousness: drowsy  Airway & Oxygen Therapy: Patient Spontanous Breathing  Post-op Assessment: Report given to RN and Post -op Vital signs reviewed and stable  Post vital signs: Reviewed and stable  Last Vitals:  Vitals Value Taken Time  BP 106/51 02/11/21 1425  Temp 36.4 C 02/11/21 1425  Pulse 83 02/11/21 1425  Resp 19 02/11/21 1425  SpO2 100 % 02/11/21 1425  Vitals shown include unvalidated device data.  Last Pain:  Vitals:   02/11/21 1425  TempSrc: Temporal  PainSc: Asleep      Patients Stated Pain Goal: 0 (40/37/54 3606)  Complications: No complications documented.

## 2021-02-11 NOTE — Anesthesia Preprocedure Evaluation (Signed)
Anesthesia Evaluation  Patient identified by MRN, date of birth, ID band Patient awake    Reviewed: Allergy & Precautions, NPO status , Patient's Chart, lab work & pertinent test results  History of Anesthesia Complications (+) DIFFICULT AIRWAYNegative for: history of anesthetic complications  Airway Mallampati: II       Dental   Pulmonary asthma , sleep apnea (resolved after weight loss) , Not current smoker,           Cardiovascular hypertension, Pt. on medications (-) Past MI and (-) CHF + dysrhythmias (PVCs) (-) Valvular Problems/Murmurs     Neuro/Psych neg Seizures Anxiety Depression    GI/Hepatic Neg liver ROS, hiatal hernia, GERD  Medicated,  Endo/Other  diabetes, Type 2, Oral Hypoglycemic AgentsHypothyroidism   Renal/GU negative Renal ROS     Musculoskeletal   Abdominal   Peds  Hematology  (+) anemia ,   Anesthesia Other Findings   Reproductive/Obstetrics                             Anesthesia Physical Anesthesia Plan  ASA: III  Anesthesia Plan: General   Post-op Pain Management:    Induction: Intravenous  PONV Risk Score and Plan: 3 and Propofol infusion and TIVA  Airway Management Planned: Nasal Cannula  Additional Equipment:   Intra-op Plan:   Post-operative Plan:   Informed Consent: I have reviewed the patients History and Physical, chart, labs and discussed the procedure including the risks, benefits and alternatives for the proposed anesthesia with the patient or authorized representative who has indicated his/her understanding and acceptance.       Plan Discussed with:   Anesthesia Plan Comments:         Anesthesia Quick Evaluation

## 2021-02-11 NOTE — Anesthesia Procedure Notes (Signed)
Date/Time: 02/11/2021 1:49 PM Performed by: Lily Peer, Ramy Greth, CRNA Pre-anesthesia Checklist: Patient identified, Emergency Drugs available, Suction available, Patient being monitored and Timeout performed Patient Re-evaluated:Patient Re-evaluated prior to induction Oxygen Delivery Method: Simple face mask Induction Type: IV induction

## 2021-02-11 NOTE — Op Note (Signed)
[facility] Patient Name: Cathlene Gardella Procedure Date: 02/11/2021 1:26 PM MRN: [id number] Account #: [id number] Date of Birth: 1971-08-05 Admit Type: Outpatient Age: 50 Room: [facility] ENDO ROOM 2 Gender: Female Note Status: Finalized Procedure:             Colonoscopy Indications:           Screening for colorectal malignant neoplasm Providers:             Benay Pike. Kimari Lienhard MD, MD Medicines:             Propofol per Anesthesia Complications:         No immediate complications. Procedure:             Pre-Anesthesia Assessment:                        - The risks and benefits of the procedure and the                         sedation options and risks were discussed with the                         patient. All questions were answered and informed                         consent was obtained.                        - Patient identification and proposed procedure were                         verified prior to the procedure by the nurse. The                         procedure was verified in the procedure room.                        - ASA Grade Assessment: III - A patient with severe                         systemic disease.                        - After reviewing the risks and benefits, the patient                         was deemed in satisfactory condition to undergo the                         procedure.                        After obtaining informed consent, the colonoscope was                         passed under direct vision. Throughout the procedure,                         the patient's blood pressure, pulse, and oxygen  saturations were monitored continuously. The                         Colonoscope was introduced through the anus and                         advanced to the the cecum, identified by appendiceal                         orifice and ileocecal valve. The colonoscopy was                         performed without  difficulty. The patient tolerated                         the procedure well. The quality of the bowel                         preparation was adequate. The ileocecal valve,                         appendiceal orifice, and rectum were photographed. Findings:      The perianal and digital rectal examinations were normal. Pertinent       negatives include normal sphincter tone and no palpable rectal lesions.      Non-bleeding internal hemorrhoids were found during retroflexion. The       hemorrhoids were Grade I (internal hemorrhoids that do not prolapse).      The colon (entire examined portion) appeared normal. Impression:            - Non-bleeding internal hemorrhoids.                        - The entire examined colon is normal.                        - No specimens collected. Recommendation:        - Patient has a contact number available for                         emergencies. The signs and symptoms of potential                         delayed complications were discussed with the patient.                         Return to normal activities tomorrow. Written                         discharge instructions were provided to the patient.                        - Await pathology results from EGD, also performed                         today.                        - Resume previous diet.                        -  Continue present medications.                        - Repeat colonoscopy in 10 years for screening                         purposes.                        - Return to my office in 6 months.                        - The findings and recommendations were discussed with                         the patient. Procedure Code(s):     --- Professional ---                        B2010, Colorectal cancer screening; colonoscopy on                         individual not meeting criteria for high risk Diagnosis Code(s):     --- Professional ---                        K64.0, First degree  hemorrhoids                        Z12.11, Encounter for screening for malignant neoplasm                         of colon CPT copyright 2019 [employer]. All rights reserved. The codes documented in this report are preliminary and upon coder review may  be revised to meet current compliance requirements. Efrain Sella MD, MD 02/11/2021 2:24:40 PM This report has been signed electronically. Number of Addenda: 0 Note Initiated On: 02/11/2021 1:26 PM Scope Withdrawal Time: 0 hours 8 minutes 49 seconds  Total Procedure Duration: 0 hours 13 minutes 44 seconds  Estimated Blood Loss:  Estimated blood loss: none.      [facility]

## 2021-02-11 NOTE — Interval H&P Note (Signed)
History and Physical Interval Note:  02/11/2021 1:31 PM  Karen Lewis  has presented today for surgery, with the diagnosis of GERD  BARRETTS SCREENING.  The various methods of treatment have been discussed with the patient and family. After consideration of risks, benefits and other options for treatment, the patient has consented to  Procedure(s): COLONOSCOPY WITH PROPOFOL (N/A) ESOPHAGOGASTRODUODENOSCOPY (EGD) WITH PROPOFOL (N/A) as a surgical intervention.  The patient's history has been reviewed, patient examined, no change in status, stable for surgery.  I have reviewed the patient's chart and labs.  Questions were answered to the patient's satisfaction.     [place], [place]

## 2021-02-11 NOTE — H&P (Signed)
Outpatient short stay form Pre-procedure 02/11/2021 1:29 PM Zorawar Strollo K. Alice Reichert, M.D.  Primary Physician: Fulton Reek, M.D.  Reason for visit:  Pharyngeal dysphagia, Barrett's esophagus, GERD, Colon cancer screening  History of present illness:  [name] is a pleasant 50 year old female presenting for colorectal cancer screening. Currently she denies any change in bowel habits, rectal bleeding, involuntary weight loss or abdominal pain. She does have a history of a ventral hernia status post panniculectomy in 2016. She also underwent to gastric bypasses, initially a sleeve gastrectomy in 2013, then a revision with a Roux-en-Y gastric bypass in 2015. Patient has suffered Covid pneumonia back in January 2022 and is being followed by Dr. Raul Del in our clinic.  Patient has history of GERD mostly controlled with Tums. She does, however, have a reported history of "Barrett's esophagitis" diagnosed by her former bariatric surgeon, Dr. Duke Salvia. She has intermittent pharyngeal dysphagia attributed to large thyroid nodules.     Current Facility-Administered Medications:  .  0.9 %  sodium chloride infusion, , Intravenous, Continuous, Lovada Barwick, Benay Pike, MD  Medications Prior to Admission  Medication Sig Dispense Refill Last Dose  . ACCU-CHEK AVIVA PLUS test strip    02/10/2021 at Unknown time  . acetaminophen (TYLENOL) 650 MG CR tablet Take 650-1,300 mg by mouth every 8 (eight) hours as needed for pain.   Past Week at Unknown time  . acidophilus (RISAQUAD) CAPS capsule Take 1 capsule by mouth daily.   Past Week at Unknown time  . albuterol (PROVENTIL HFA;VENTOLIN HFA) 108 (90 BASE) MCG/ACT inhaler Inhale 2 puffs into the lungs every 6 (six) hours as needed for wheezing or shortness of breath.   Past Week at Unknown time  . amitriptyline (ELAVIL) 100 MG tablet Take 100 mg by mouth at bedtime.   02/10/2021 at Unknown time  . Ascorbic Acid (VITAMIN C) 1000 MG tablet Take 1,000 mg by mouth daily.    Past Week  at Unknown time  . Biotin 10 MG CAPS Take 10 mg by mouth daily.    Past Week at Unknown time  . butalbital-acetaminophen-caffeine (FIORICET) 50-325-40 MG tablet Take 1 tablet by mouth every 6 (six) hours as needed for migraine.    02/10/2021 at Unknown time  . calcium carbonate (TUMS EX) 750 MG chewable tablet Chew 1 tablet by mouth daily.    Past Week at Unknown time  . clonazePAM (KLONOPIN) 0.5 MG tablet Take 1 tablet (0.5 mg total) by mouth 3 (three) times daily as needed for anxiety. 30 tablet 0 02/11/2021 at 0500am  . Cyanocobalamin (B-12) 2500 MCG TABS Take 2,500 mcg by mouth daily.   Past Week at Unknown time  . diclofenac sodium (VOLTAREN) 1 % GEL Apply 2 g topically 4 (four) times daily. (Patient taking differently: Apply 2 g topically 3 (three) times daily as needed (pain).) 100 g 1 Past Week at Unknown time  . diphenhydrAMINE (BENADRYL) 25 MG tablet Take 25 mg by mouth every 8 (eight) hours as needed for itching or allergies.    Past Week at Unknown time  . DUPIXENT 300 MG/2ML SOPN    Past Week at Unknown time  . EMGALITY 120 MG/ML SOAJ Inject into the skin.   Past Week at Unknown time  . EPINEPHrine 0.3 mg/0.3 mL IJ SOAJ injection Inject 0.3 mg into the muscle as needed for anaphylaxis.    Past Month at Unknown time  . ergocalciferol (VITAMIN D2) 1.25 MG (50000 UT) capsule Take 50,000 Units by mouth 2 (two) times a week.  Past Week at Unknown time  . famotidine (PEPCID) 20 MG tablet Take 20 mg by mouth at bedtime.   02/10/2021 at 0100  . furosemide (LASIX) 20 MG tablet Take 20 mg by mouth daily as needed.   Past Week at Unknown time  . glucose blood (ACCU-CHEK AVIVA PLUS) test strip Use 2 (two) times daily   02/10/2021 at Unknown time  . guaiFENesin-codeine (ROBITUSSIN AC) 100-10 MG/5ML syrup Take by mouth.   Past Week at Unknown time  . levocetirizine (XYZAL) 5 MG tablet Take 5 mg by mouth at bedtime.    02/10/2021 at Unknown time  . magnesium oxide (MAG-OX) 400 MG tablet Take 1,200 mg by  mouth 2 (two) times daily. Take 1200 mg by mouth in the morning and 1200 mg at bedtime   Past Week at Unknown time  . meclizine (ANTIVERT) 25 MG tablet Take 25 mg by mouth 3 (three) times daily as needed for dizziness.    02/10/2021 at Unknown time  . metFORMIN (GLUCOPHAGE) 1000 MG tablet Take 1,000 mg by mouth 2 (two) times daily with a meal.    Past Week at Unknown time  . metoprolol tartrate (LOPRESSOR) 50 MG tablet Take 75 mg by mouth 2 (two) times daily.    02/11/2021 at 0500am  . montelukast (SINGULAIR) 10 MG tablet Take 10 mg by mouth at bedtime.    02/10/2021 at Unknown time  . Multiple Vitamin (TAB-A-VITE) TABS    Past Week at Unknown time  . naphazoline-pheniramine (NAPHCON-A) 0.025-0.3 % ophthalmic solution Place 1 drop into both eyes 4 (four) times daily as needed for eye irritation.   Past Month at Unknown time  . nitrofurantoin, macrocrystal-monohydrate, (MACROBID) 100 MG capsule Take 1 capsule (100 mg total) by mouth daily. 30 capsule 11 02/10/2021 at 0500  . omeprazole (PRILOSEC) 40 MG capsule Take 40 mg by mouth in the morning and at bedtime.   02/11/2021 at 0500  . oxyCODONE (OXY IR/ROXICODONE) 5 MG immediate release tablet Take 1 tablet (5 mg total) by mouth 5 (five) times daily. Must last 30 days 150 tablet 0 02/10/2021 at 1700  . OZEMPIC, 0.25 OR 0.5 MG/DOSE, 2 MG/1.5ML SOPN Inject into the skin.   02/10/2021 at 0500am  . QUEtiapine (SEROQUEL) 100 MG tablet Take 100 mg by mouth at bedtime.   02/10/2021 at Unknown time  . rizatriptan (MAXALT) 10 MG tablet Take 1 and may repeat in 2 hours for MIgraines, alternate with the disintrgrating tablet.   Past Week at Unknown time  . rizatriptan (MAXALT-MLT) 10 MG disintegrating tablet Take 10 mg by mouth. Take 1 and may repeat in 2 hours  for Migraines   Past Week at Unknown time  . rosuvastatin (CRESTOR) 40 MG tablet Take 40 mg by mouth daily.    02/11/2021 at 0500am  . sertraline (ZOLOFT) 100 MG tablet Take 200 mg by mouth daily.    02/11/2021 at  0500am  . spironolactone (ALDACTONE) 25 MG tablet Take 37.5 mg by mouth 2 (two) times daily.    Past Week at Unknown time  . tiZANidine (ZANAFLEX) 4 MG tablet Take 1 tablet (4 mg total) by mouth 2 (two) times daily as needed for muscle spasms. 30 tablet 0 02/10/2021 at 0500am  . topiramate (TOPAMAX) 100 MG tablet Take by mouth.   02/11/2021 at 0500am  . triamcinolone (NASACORT) 55 MCG/ACT AERO nasal inhaler Place 2 sprays into the nose 2 (two) times daily as needed (allergies).    Past Week at Unknown time  .  zolpidem (AMBIEN) 10 MG tablet Take 10 mg by mouth at bedtime as needed for sleep.   Past Month at Unknown time  . zonisamide (ZONEGRAN) 50 MG capsule Take 150 mg by mouth at bedtime.    02/10/2021 at 1900  . levothyroxine (SYNTHROID) 125 MCG tablet Take 250 mcg by mouth daily before breakfast.      . lisinopril (ZESTRIL) 5 MG tablet Take 5 mg by mouth at bedtime.      . magnesium oxide (MAG-OX) 400 (241.3 Mg) MG tablet SMARTSIG:1 By Mouth 4-5 Times Daily (Patient not taking: Reported on 01/21/2021)     . naloxone (NARCAN) nasal spray 4 mg/0.1 mL Place 1 spray into the nose as needed for up to 365 doses (for opioid-induced respiratory depresssion). In case of emergency (overdose), spray once into each nostril. If no response within 3 minutes, repeat application and call 892. 1 each 0   . naratriptan (AMERGE) 2.5 MG tablet Take by mouth. (Patient not taking: Reported on 01/21/2021)     . [START ON 02/27/2021] oxyCODONE (OXY IR/ROXICODONE) 5 MG immediate release tablet Take 1 tablet (5 mg total) by mouth 5 (five) times daily. Must last 30 days 150 tablet 0   . [START ON 03/29/2021] oxyCODONE (OXY IR/ROXICODONE) 5 MG immediate release tablet Take 1 tablet (5 mg total) by mouth 5 (five) times daily. Must last 30 days 150 tablet 0   . pregabalin (LYRICA) 225 MG capsule Take 1 capsule (225 mg total) by mouth 2 (two) times daily. 180 capsule 0   . topiramate (TOPAMAX) 50 MG tablet Take 50 mg by mouth 2 (two)  times daily.        Allergies  Allergen Reactions  . Bactrim [Sulfamethoxazole-Trimethoprim] Hives  . Omalizumab Itching and Hives  . Ciprofloxacin Other (See Comments)    myalgia   . Shellfish Allergy Other (See Comments)    + positive allergy test  . Nsaids Nausea Only and Other (See Comments)    Stomach upset     Past Medical History:  Diagnosis Date  . Anemia   . Anginal pain ([place])   . Anxiety   . Arthralgia of hip 07/29/2015  . Arthritis   . Arthritis, degenerative 07/29/2015  . Asthma   . Cephalalgia 07/25/2014  . Dependence on unknown drug ([place])    multiplt controlled drug dependence  . Depression   . Diabetes mellitus without complication ([place])   . Difficult intubation    per pt needs small tube (has had abrasions from tube in past)  . Dysrhythmia    PVC's  . Eczema   . Fibromyalgia   . Gastritis   . GERD (gastroesophageal reflux disease)   . Gonalgia 07/29/2015   Overview:  Overview:  The patient has had bilateral intra-articular Hyalgan injections done on 07/16/2014 and although she seems to do well with this type of therapy, apparently her insurance company does not want to pay for they Hyalgan. On 11/27/2014 the patient underwent a bilateral genicular nerve block with excellent results. On 01/28/2015 she had a right knee genicular radiofrequency ablatio  . Gout   . H/O cardiovascular disorder 03/10/2015  . H/O surgical procedure 12/05/2012   Overview:  LSG ([facility] - April 2013)    . H/O thyroid disease 03/10/2015  . Headache   . Herpes   . History of artificial joint 07/29/2015  . History of hiatal hernia   . Hypertension   . Hypomagnesemia   . Hypothyroidism   . IDA (iron deficiency  anemia) 05/28/2019  . LBP (low back pain) 07/29/2015  . Neuromuscular disorder ([place])   . Obesity   . PCOS (polycystic ovarian syndrome)   . Primary osteoarthritis of both knees 07/29/2015  . Sleep apnea    not using a Cpap machine at this time - most recent test mild  apnea does not qualify for test (not OSA)  . Thyroid nodule bilateral  . Umbilical hernia     Review of systems:  Otherwise negative.    Physical Exam  Gen: Alert, oriented. Appears stated age.  HEENT: [place]/AT. PERRLA. Lungs: CTA, no wheezes. CV: RR nl S1, S2. Abd: soft, benign, no masses. BS+ Ext: No edema. Pulses 2+    Planned procedures: Proceed with EGD and colonoscopy. The patient understands the nature of the planned procedure, indications, risks, alternatives and potential complications including but not limited to bleeding, infection, perforation, damage to internal organs and possible oversedation/side effects from anesthesia. The patient agrees and gives consent to proceed.  Please refer to procedure notes for findings, recommendations and patient disposition/instructions.     Karen Lewis K. Alice Reichert, M.D. Gastroenterology 02/11/2021  1:29 PM

## 2021-02-11 NOTE — Anesthesia Postprocedure Evaluation (Signed)
Anesthesia Post Note  Patient: Karen Lewis  Procedure(s) Performed: COLONOSCOPY WITH PROPOFOL (N/A ) ESOPHAGOGASTRODUODENOSCOPY (EGD) WITH PROPOFOL (N/A )  Patient location during evaluation: Endoscopy Anesthesia Type: General Level of consciousness: awake and alert and oriented Pain management: pain level controlled Vital Signs Assessment: post-procedure vital signs reviewed and stable Respiratory status: spontaneous breathing, nonlabored ventilation and respiratory function stable Cardiovascular status: blood pressure returned to baseline and stable Postop Assessment: no signs of nausea or vomiting Anesthetic complications: no   No complications documented.   Last Vitals:  Vitals:   02/11/21 1255 02/11/21 1425  BP: 125/88 (!) 106/51  Pulse:  83  Resp: 20 20  Temp: (!) 35.7 C (!) 36.4 C  SpO2: 96% 100%    Last Pain:  Vitals:   02/11/21 1445  TempSrc:   PainSc: 0-No pain                 Abdalla Naramore

## 2021-02-11 NOTE — Op Note (Signed)
[facility] Gastroenterology Patient Name: Karen Lewis Procedure Date: 02/11/2021 1:26 PM MRN: [id number] Account #: [id number] Date of Birth: [birth year] Admit Type: Outpatient Age: 50 Room: [facility] ENDO ROOM 2 Gender: Female Note Status: Finalized Procedure:             Upper GI endoscopy Indications:           Surveillance for malignancy due to personal history of                         Barrett's esophagus, Esophageal reflux Providers:             Benay Pike. Evanny Ellerbe MD, MD Medicines:             Propofol per Anesthesia Complications:         No immediate complications. Procedure:             Pre-Anesthesia Assessment:                        - The risks and benefits of the procedure and the                         sedation options and risks were discussed with the                         patient. All questions were answered and informed                         consent was obtained.                        - Patient identification and proposed procedure were                         verified prior to the procedure by the nurse. The                         procedure was verified in the procedure room.                        - ASA Grade Assessment: III - A patient with severe                         systemic disease.                        - After reviewing the risks and benefits, the patient                         was deemed in satisfactory condition to undergo the                         procedure.                        After obtaining informed consent, the endoscope was                         passed under direct vision. Throughout the procedure,  the patient's blood pressure, pulse, and oxygen                         saturations were monitored continuously. The Endoscope                         was introduced through the mouth, and advanced to the                         third part of duodenum. The upper GI endoscopy was                          accomplished without difficulty. The patient tolerated                         the procedure well. Findings:      There were esophageal mucosal changes secondary to established       short-segment Barrett's disease present at the gastroesophageal       junction. The maximum longitudinal extent of these mucosal changes was 1       cm in length. Mucosa was biopsied with a cold forceps for histology in 4       quadrants at the gastroesophageal junction. One specimen bottle was sent       to pathology.      Evidence of a Roux-en-Y gastrojejunostomy was found. The gastrojejunal       anastomosis was characterized by healthy appearing mucosa. This was       traversed. The pouch-to-jejunum limb was characterized by healthy       appearing mucosa. The duodenum-to-jejunum limb was not examined as it       could not be traversed.      The examined jejunum was normal.      The exam was otherwise without abnormality. Impression:            - Esophageal mucosal changes secondary to established                         short-segment Barrett's disease. Biopsied.                        - Roux-en-Y gastrojejunostomy with gastrojejunal                         anastomosis characterized by healthy appearing mucosa.                        - Normal examined jejunum.                        - The examination was otherwise normal. Recommendation:        - Await pathology results from EGD, also performed                         today.                        - Proceed with colonoscopy Procedure Code(s):     --- Professional ---  83151, Esophagogastroduodenoscopy, flexible,                         transoral; with biopsy, single or multiple Diagnosis Code(s):     --- Professional ---                        K21.9, Gastro-esophageal reflux disease without                         esophagitis                        Z98.0, Intestinal bypass and anastomosis status                        K22.70,  Barrett's esophagus without dysplasia CPT copyright 2019 [employer]. All rights reserved. The codes documented in this report are preliminary and upon coder review may  be revised to meet current compliance requirements. Efrain Sella MD, MD 02/11/2021 2:06:16 PM This report has been signed electronically. Number of Addenda: 0 Note Initiated On: 02/11/2021 1:26 PM Estimated Blood Loss:  Estimated blood loss: none.      [facility]

## 2021-02-12 ENCOUNTER — Encounter: Payer: Self-pay | Admitting: Internal Medicine

## 2021-02-13 LAB — SURGICAL PATHOLOGY

## 2021-02-18 DIAGNOSIS — E1169 Type 2 diabetes mellitus with other specified complication: Secondary | ICD-10-CM | POA: Diagnosis not present

## 2021-02-18 DIAGNOSIS — E1159 Type 2 diabetes mellitus with other circulatory complications: Secondary | ICD-10-CM | POA: Diagnosis not present

## 2021-02-18 DIAGNOSIS — E282 Polycystic ovarian syndrome: Secondary | ICD-10-CM | POA: Diagnosis not present

## 2021-02-18 DIAGNOSIS — Z0000 Encounter for general adult medical examination without abnormal findings: Secondary | ICD-10-CM | POA: Diagnosis not present

## 2021-02-18 DIAGNOSIS — I152 Hypertension secondary to endocrine disorders: Secondary | ICD-10-CM | POA: Diagnosis not present

## 2021-02-18 DIAGNOSIS — Z794 Long term (current) use of insulin: Secondary | ICD-10-CM | POA: Diagnosis not present

## 2021-02-18 DIAGNOSIS — Z6841 Body Mass Index (BMI) 40.0 and over, adult: Secondary | ICD-10-CM | POA: Diagnosis not present

## 2021-02-18 DIAGNOSIS — G8929 Other chronic pain: Secondary | ICD-10-CM | POA: Diagnosis not present

## 2021-02-18 DIAGNOSIS — R519 Headache, unspecified: Secondary | ICD-10-CM | POA: Diagnosis not present

## 2021-02-18 DIAGNOSIS — M797 Fibromyalgia: Secondary | ICD-10-CM | POA: Diagnosis not present

## 2021-02-18 DIAGNOSIS — E785 Hyperlipidemia, unspecified: Secondary | ICD-10-CM | POA: Diagnosis not present

## 2021-02-18 DIAGNOSIS — E559 Vitamin D deficiency, unspecified: Secondary | ICD-10-CM | POA: Diagnosis not present

## 2021-02-18 DIAGNOSIS — G4440 Drug-induced headache, not elsewhere classified, not intractable: Secondary | ICD-10-CM | POA: Diagnosis not present

## 2021-02-18 DIAGNOSIS — E890 Postprocedural hypothyroidism: Secondary | ICD-10-CM | POA: Diagnosis not present

## 2021-02-18 DIAGNOSIS — I10 Essential (primary) hypertension: Secondary | ICD-10-CM | POA: Diagnosis not present

## 2021-02-18 DIAGNOSIS — E119 Type 2 diabetes mellitus without complications: Secondary | ICD-10-CM | POA: Diagnosis not present

## 2021-02-18 DIAGNOSIS — E669 Obesity, unspecified: Secondary | ICD-10-CM | POA: Diagnosis not present

## 2021-02-18 DIAGNOSIS — J4540 Moderate persistent asthma, uncomplicated: Secondary | ICD-10-CM | POA: Diagnosis not present

## 2021-02-19 ENCOUNTER — Ambulatory Visit
Admission: RE | Admit: 2021-02-19 | Discharge: 2021-02-19 | Disposition: A | Discharge status: Home / Self Care | Payer: Medicare HMO | Source: Ambulatory Visit | Attending: Pain Medicine | Admitting: Pain Medicine

## 2021-02-19 ENCOUNTER — Other Ambulatory Visit: Payer: Self-pay

## 2021-02-19 ENCOUNTER — Ambulatory Visit (HOSPITAL_BASED_OUTPATIENT_CLINIC_OR_DEPARTMENT_OTHER): Payer: Medicare HMO | Admitting: Pain Medicine

## 2021-02-19 ENCOUNTER — Encounter: Payer: Self-pay | Admitting: Pain Medicine

## 2021-02-19 VITALS — BP 107/52 | HR 78 | Temp 96.9°F | Resp 16 | Ht 64.0 in | Wt 361.0 lb

## 2021-02-19 DIAGNOSIS — M25562 Pain in left knee: Secondary | ICD-10-CM | POA: Diagnosis not present

## 2021-02-19 DIAGNOSIS — G8929 Other chronic pain: Secondary | ICD-10-CM

## 2021-02-19 DIAGNOSIS — M25561 Pain in right knee: Secondary | ICD-10-CM

## 2021-02-19 DIAGNOSIS — M174 Other bilateral secondary osteoarthritis of knee: Secondary | ICD-10-CM

## 2021-02-19 DIAGNOSIS — Z6841 Body Mass Index (BMI) 40.0 and over, adult: Secondary | ICD-10-CM | POA: Insufficient documentation

## 2021-02-19 MED ORDER — ROPIVACAINE HCL 2 MG/ML IJ SOLN
5.0000 mL | Freq: Once | INTRAMUSCULAR | Status: AC
  Administered 2021-02-19: 5 mL via INTRA_ARTICULAR

## 2021-02-19 MED ORDER — LACTATED RINGERS IV SOLN
1000.0000 mL | Freq: Once | INTRAVENOUS | Status: AC
  Administered 2021-02-19: 1000 mL via INTRAVENOUS

## 2021-02-19 MED ORDER — FENTANYL CITRATE (PF) 100 MCG/2ML IJ SOLN
25.0000 ug | INTRAMUSCULAR | Status: DC | PRN
  Administered 2021-02-19: 50 ug via INTRAVENOUS

## 2021-02-19 MED ORDER — LIDOCAINE HCL 2 % IJ SOLN
20.0000 mL | Freq: Once | INTRAMUSCULAR | Status: AC
Start: 2021-02-19 — End: 2021-02-19
  Administered 2021-02-19: 200 mg

## 2021-02-19 MED ORDER — LIDOCAINE HCL (PF) 1 % IJ SOLN
5.0000 mL | Freq: Once | INTRAMUSCULAR | Status: AC
  Administered 2021-02-19: 5 mL

## 2021-02-19 MED ORDER — MIDAZOLAM HCL 5 MG/5ML IJ SOLN
1.0000 mg | INTRAMUSCULAR | Status: DC | PRN
  Administered 2021-02-19: 2 mg via INTRAVENOUS
  Administered 2021-02-19: 1 mg via INTRAVENOUS

## 2021-02-19 MED ORDER — TRIAMCINOLONE ACETONIDE 32 MG IX SRER
32.0000 mg | Freq: Once | INTRA_ARTICULAR | Status: DC
  Filled 2021-02-19: qty 32

## 2021-02-19 MED ORDER — LIDOCAINE HCL (PF) 2 % IJ SOLN
INTRAMUSCULAR | Status: AC
  Filled 2021-02-19: qty 5

## 2021-02-19 MED ORDER — MIDAZOLAM HCL 5 MG/5ML IJ SOLN
INTRAMUSCULAR | Status: AC
  Filled 2021-02-19: qty 5

## 2021-02-19 MED ORDER — TRIAMCINOLONE ACETONIDE 32 MG IX SRER
32.0000 mg | Freq: Once | INTRA_ARTICULAR | Status: AC
Start: 2021-02-19 — End: 2021-02-19
  Administered 2021-02-19: 32 mg via INTRA_ARTICULAR
  Filled 2021-02-19: qty 32

## 2021-02-19 MED ORDER — LIDOCAINE HCL (PF) 1 % IJ SOLN
INTRAMUSCULAR | Status: AC
  Filled 2021-02-19: qty 10

## 2021-02-19 MED ORDER — FENTANYL CITRATE (PF) 100 MCG/2ML IJ SOLN
INTRAMUSCULAR | Status: AC
  Filled 2021-02-19: qty 2

## 2021-02-19 MED ORDER — ROPIVACAINE HCL 2 MG/ML IJ SOLN
INTRAMUSCULAR | Status: AC
  Filled 2021-02-19: qty 20

## 2021-02-19 NOTE — Progress Notes (Signed)
PROVIDER NOTE: Information contained herein reflects review and annotations entered in association with encounter. Interpretation of such information and data should be left to medically-trained personnel. Information provided to patient can be located elsewhere in the medical record under "Patient Instructions". Document created using STT-dictation technology, any transcriptional errors that may result from process are unintentional.    Patient: Karen Lewis  Service Category: Procedure  Provider: Gaspar Cola, MD  DOB: [birth year]  DOS: 02/19/2021  Location: [facility]  MRN: [id number]  Setting: Ambulatory - outpatient  Referring Provider: Idelle Crouch, MD  Type: Established Patient  Specialty: Interventional Pain Management  PCP: Idelle Crouch, MD   Primary Reason for Visit: Interventional Pain Management Treatment. CC: Knee Pain (bilateral)  Procedure:          Anesthesia, Analgesia, Anxiolysis:  Type: Palliative Intra-Articular Local anesthetic and steroid (Zilretta) knee Injection          Region: Lateral infrapatellar Knee Region Level: Knee Joint Laterality: Bilateral  Type: Moderate (Conscious) Sedation combined with Local Anesthesia Indication(s): Analgesia         Local Anesthetic: Lidocaine 1-2% Route: Intravenous (IV) IV Access: Secured Sedation: Meaningful verbal contact was maintained at all times during the procedure   Position: Supine w/ knee bent 20 to 30 degrees   Indications: 1. Chronic knee pain (1ry area of Pain) (Bilateral) (R>L)   2. Secondary Osteoarthritis of knee (Bilateral) (R>L)   3. Class 3 severe obesity due to excess calories with serious comorbidity and body mass index (BMI) of 60.0 to 69.9 in adult [facility])    Pain Score: Pre-procedure: 5 /10 Post-procedure: 0-No pain/10   Pre-op H&P Assessment:  Karen Lewis is a 50 y.o. (year old), female patient, seen today for interventional treatment. She  has a past surgical history that  includes Laparoscopic partial gastrectomy; Shoulder arthroscopy (Right); Carpal tunnel release (Bilateral); Diagnostic laparoscopy; Cholecystectomy; Trigger finger release (Right); Thyroidectomy (N/A, 11/12/2015); left trigger finger; Roux-en-Y Gastric Bypass (06/03/2017); Hiatal hernia repair; peniculectomy (N/A, 07/05/2018); Total hip arthroplasty (Right, 11/27/2019); Joint replacement (Bilateral, hip); Appendectomy; Trigger finger release (Right, 04/24/2020); Colonoscopy with propofol (N/A, 02/11/2021); and Esophagogastroduodenoscopy (egd) with propofol (N/A, 02/11/2021). Karen Lewis has a current medication list which includes the following prescription(s): accu-chek aviva plus, acetaminophen, acidophilus, albuterol, amitriptyline, vitamin c, biotin, butalbital-acetaminophen-caffeine, calcium carbonate, b-12, diclofenac sodium, diphenhydramine, dupixent, emgality, epinephrine, ergocalciferol, famotidine, furosemide, accu-chek aviva plus, guaifenesin-codeine, levocetirizine, levothyroxine, lisinopril, magnesium oxide, meclizine, metformin, metoprolol tartrate, montelukast, tab-a-vite, naloxone, naphazoline-pheniramine, nitrofurantoin (macrocrystal-monohydrate), omeprazole, oxycodone, [START ON 02/27/2021] oxycodone, [START ON 03/29/2021] oxycodone, ozempic (0.25 or 0.5 mg/dose), pregabalin, quetiapine, rizatriptan, rizatriptan, rosuvastatin, sertraline, spironolactone, tizanidine, topiramate, topiramate, zolpidem, zonisamide, clonazepam, magnesium oxide, naratriptan, and triamcinolone, and the following Facility-Administered Medications: fentanyl, midazolam, and triamcinolone acetonide. Her primarily concern today is the Knee Pain (bilateral)  Initial Vital Signs:  Pulse/HCG Rate: 88ECG Heart Rate: 77 Temp: (!) 97.1 F (36.2 C) Resp: 20 BP: (!) 147/88 SpO2: 100 %  BMI: Estimated body mass index is 61.97 kg/m as calculated from the following:   Height as of this encounter: 5\' 4"  (1.626 m).   Weight as of this  encounter: 361 lb (163.7 kg).  Risk Assessment: Allergies: Reviewed. She is allergic to bactrim [sulfamethoxazole-trimethoprim], omalizumab, ciprofloxacin, shellfish allergy, and nsaids.  Allergy Precautions: None required Coagulopathies: Reviewed. None identified.  Blood-thinner therapy: None at this time Active Infection(s): Reviewed. None identified. Karen Lewis is afebrile  Site Confirmation: Karen Lewis was asked to confirm the procedure and laterality before marking  the site Procedure checklist: Completed Consent: Before the procedure and under the influence of no sedative(s), amnesic(s), or anxiolytics, the patient was informed of the treatment options, risks and possible complications. To fulfill our ethical and legal obligations, as recommended by the American Medical Association's Code of Ethics, I have informed the patient of my clinical impression; the nature and purpose of the treatment or procedure; the risks, benefits, and possible complications of the intervention; the alternatives, including doing nothing; the risk(s) and benefit(s) of the alternative treatment(s) or procedure(s); and the risk(s) and benefit(s) of doing nothing. The patient was provided information about the general risks and possible complications associated with the procedure. These may include, but are not limited to: failure to achieve desired goals, infection, bleeding, organ or nerve damage, allergic reactions, paralysis, and death. In addition, the patient was informed of those risks and complications associated to the procedure, such as failure to decrease pain; infection; bleeding; organ or nerve damage with subsequent damage to sensory, motor, and/or autonomic systems, resulting in permanent pain, numbness, and/or weakness of one or several areas of the body; allergic reactions; (i.e.: anaphylactic reaction); and/or death. Furthermore, the patient was informed of those risks and complications associated with the  medications. These include, but are not limited to: allergic reactions (i.e.: anaphylactic or anaphylactoid reaction(s)); adrenal axis suppression; blood sugar elevation that in diabetics may result in ketoacidosis or comma; water retention that in patients with history of congestive heart failure may result in shortness of breath, pulmonary edema, and decompensation with resultant heart failure; weight gain; swelling or edema; medication-induced neural toxicity; particulate matter embolism and blood vessel occlusion with resultant organ, and/or nervous system infarction; and/or aseptic necrosis of one or more joints. Finally, the patient was informed that Medicine is not an exact science; therefore, there is also the possibility of unforeseen or unpredictable risks and/or possible complications that may result in a catastrophic outcome. The patient indicated having understood very clearly. We have given the patient no guarantees and we have made no promises. Enough time was given to the patient to ask questions, all of which were answered to the patient's satisfaction. Ms. Kinker has indicated that she wanted to continue with the procedure. Attestation: I, the ordering provider, attest that I have discussed with the patient the benefits, risks, side-effects, alternatives, likelihood of achieving goals, and potential problems during recovery for the procedure that I have provided informed consent. Date  Time: 02/19/2021  9:02 AM  Pre-Procedure Preparation:  Monitoring: As per clinic protocol. Respiration, ETCO2, SpO2, BP, heart rate and rhythm monitor placed and checked for adequate function Safety Precautions: Patient was assessed for positional comfort and pressure points before starting the procedure. Time-out: I initiated and conducted the "Time-out" before starting the procedure, as per protocol. The patient was asked to participate by confirming the accuracy of the "Time Out" information. Verification of  the correct person, site, and procedure were performed and confirmed by me, the nursing staff, and the patient. "Time-out" conducted as per Joint Commission's Universal Protocol (UP.01.01.01). Time: 0944  Description of Procedure:          Target Area: Knee Joint Approach: Just above the Lateral tibial plateau, lateral to the infrapatellar tendon. Area Prepped: Entire knee area, from the mid-thigh to the mid-shin. DuraPrep (Iodine Povacrylex [0.7% available iodine] and Isopropyl Alcohol, 74% w/w) Safety Precautions: Aspiration looking for blood return was conducted prior to all injections. At no point did we inject any substances, as a needle was being advanced.  No attempts were made at seeking any paresthesias. Safe injection practices and needle disposal techniques used. Medications properly checked for expiration dates. SDV (single dose vial) medications used. Description of the Procedure: Protocol guidelines were followed. The patient was placed in position over the fluoroscopy table. The target area was identified and the area prepped in the usual manner. Skin & deeper tissues infiltrated with local anesthetic. Appropriate amount of time allowed to pass for local anesthetics to take effect. The procedure needles were then advanced to the target area. Proper needle placement secured. Negative aspiration confirmed. Solution injected in intermittent fashion, asking for systemic symptoms every 0.5cc of injectate. The needles were then removed and the area cleansed, making sure to leave some of the prepping solution back to take advantage of its long term bactericidal properties. Vitals:   02/19/21 0958 02/19/21 1004 02/19/21 1014 02/19/21 1024  BP: 126/90 (!) 136/115 114/74 (!) 107/52  Pulse: 78     Resp: 16 19 14 16   Temp:  (!) 96.8 F (36 C)  (!) 96.9 F (36.1 C)  TempSrc:  Temporal  Temporal  SpO2: 99% 98% 99% 100%  Weight:      Height:        Start Time: 0944 hrs. End Time: 0958  hrs. Materials:  Needle(s) Type: Regular needle Gauge: 25G Length: 1.5-in Medication(s): Please see orders for medications and dosing details.  Imaging Guidance:          Type of Imaging Technique: None used Indication(s): N/A Exposure Time: No patient exposure Contrast: None used. Fluoroscopic Guidance: N/A Ultrasound Guidance: N/A Interpretation: N/A  Antibiotic Prophylaxis:   Anti-infectives (From admission, onward)    None      Indication(s): None identified  Post-operative Assessment:  Post-procedure Vital Signs:  Pulse/HCG Rate: 78 (nsr)79 Temp: (!) 96.9 F (36.1 C) Resp: 16 BP: (!) 107/52 SpO2: 100 %  EBL: None  Complications: No immediate post-treatment complications observed by team, or reported by patient.  Note: The patient tolerated the entire procedure well. A repeat set of vitals were taken after the procedure and the patient was kept under observation following institutional policy, for this type of procedure. Post-procedural neurological assessment was performed, showing return to baseline, prior to discharge. The patient was provided with post-procedure discharge instructions, including a section on how to identify potential problems. Should any problems arise concerning this procedure, the patient was given instructions to immediately contact us, at any time, without hesitation. In any case, we plan to contact the patient by telephone for a follow-up status report regarding this interventional procedure.  Comments:  No additional relevant information.  Plan of Care  Orders:  Orders Placed This Encounter  Procedures   KNEE INJECTION    Local Anesthetic & Steroid injection.    Scheduling Instructions:     Side(s): Bilateral Knee     Sedation: With Sedation.     Timeframe: Today    Order Specific Question:   Where will this procedure be performed?    Answer:   [facility]   DG PA[facility] C-ARM 1-60 MIN NO REPORT    Intraoperative  interpretation by procedural physician at [place].    Standing Status:   Standing    Number of Occurrences:   1    Order Specific Question:   Reason for exam:    Answer:   Assistance in needle guidance and placement for procedures requiring needle placement in or near specific anatomical locations not easily accessible without such  assistance.   Informed Consent Details: Physician/Practitioner Attestation; Transcribe to consent form and obtain patient signature    Note: Always confirm laterality of pain with Ms. Brigitte Pulse, before procedure. Transcribe to consent form and obtain patient signature.    Order Specific Question:   Physician/Practitioner attestation of informed consent for procedure/surgical case    Answer:   I, the physician/practitioner, attest that I have discussed with the patient the benefits, risks, side effects, alternatives, likelihood of achieving goals and potential problems during recovery for the procedure that I have provided informed consent.    Order Specific Question:   Procedure    Answer:   Bilateral intra-articular knee arthrocentesis (aspiration and/or injection)    Order Specific Question:   Physician/Practitioner performing the procedure    Answer:   Maria Coin A. [name], MD    Order Specific Question:   Indication/Reason    Answer:   Chronic bilateral knee pain secondary to knee arthropathy/arthralgia   Provide equipment / supplies at bedside    "Block Tray" (Disposable  single use) Needle type: SpinalSpinal Amount/quantity: 2 Size: Regular (3.5-inch) Gauge: 22G    Standing Status:   Standing    Number of Occurrences:   1    Order Specific Question:   Specify    Answer:   Block Tray    Chronic Opioid Analgesic:  Oxycodone IR 5 mg 1 tab PO 5X/day (#150/mo) (25 mg/day) MME/day: 37.5 mg/day.   Medications ordered for procedure: Meds ordered this encounter  Medications   lidocaine (XYLOCAINE) 2 % (with pres) injection 400 mg   lactated  ringers infusion 1,000 mL   midazolam (VERSED) 5 MG/5ML injection 1-2 mg    Make sure Flumazenil is available in the pyxis when using this medication. If oversedation occurs, administer 0.2 mg IV over 15 sec. If after 45 sec no response, administer 0.2 mg again over 1 min; may repeat at 1 min intervals; not to exceed 4 doses (1 mg)   fentaNYL (SUBLIMAZE) injection 25-50 mcg    Make sure Narcan is available in the pyxis when using this medication. In the event of respiratory depression (RR< 8/min): Titrate NARCAN (naloxone) in increments of 0.1 to 0.2 mg IV at 2-3 minute intervals, until desired degree of reversal.   Triamcinolone Acetonide SRER 32 mg    Maintain refrigerated.  Prepared suspension may be stored up to 4 hours at ambient conditions.   lidocaine (PF) (XYLOCAINE) 1 % injection 5 mL   ropivacaine (PF) 2 mg/mL (0.2%) (NAROPIN) injection 5 mL   Triamcinolone Acetonide SRER 32 mg    Maintain refrigerated.  Prepared suspension may be stored up to 4 hours at ambient conditions.    Medications administered: We administered lidocaine, lactated ringers, midazolam, fentaNYL, lidocaine (PF), ropivacaine (PF) 2 mg/mL (0.2%), and Triamcinolone Acetonide.  See the medical record for exact dosing, route, and time of administration.  Follow-up plan:   Return in about 2 weeks (around 03/05/2021) for procedure day (afternoon VV) (PPE).      Interventional Therapies  Risk  Complexity Considerations:   Estimated body mass index is 62.48 kg/m as calculated from the following:   Height as of this encounter: 5\' 4"  (1.626 m).   Weight as of this encounter: 364 lb (165.1 kg). NOTE: NO RFA until BMI less or equal to 35.  (Gastric bypass done on 06/03/2017) Iodine allergy  Contrast dye allergy  Shellfish allergy    Planned  Pending:   Palliative bilateral IA steroid knee injection #1  Palliative bilateral  lumbar facet block #5    Under consideration:   Diagnostic bilateral genicular nerve block     Completed:   Therapeutic bilateral IA Hyalgan knee injection x16 (06/24/2020)  Therapeutic bilateral THR (total hip replacements) (Dr. Rudene Christians) (Right: 11/27/2019) (Left: 07/29/2015)  Therapeutic bilateral lumbar facet MBB x4 (03/07/2018)  Therapeutic right IA hip injection x3 (07/07/2017)  Therapeutic left IA shoulder (glenohumeral joint) injection x1 (05/31/2017)    Therapeutic  Palliative (PRN) options:   Palliative bilateral IA Hyalgan knee injections  Palliative bilateral lumbar facet blocks      Recent Visits Date Type Provider Dept  01/21/21 Office Visit Milinda Pointer, MD [facility]  Showing recent visits within past 90 days and meeting all other requirements Today's Visits Date Type Provider Dept  02/19/21 Procedure visit Milinda Pointer, MD [facility]  Showing today's visits and meeting all other requirements Future Appointments Date Type Provider Dept  03/05/21 Appointment Milinda Pointer, MD [facility]  04/22/21 Appointment Milinda Pointer, MD [facility]  Showing future appointments within next 90 days and meeting all other requirements Disposition: Discharge home  Discharge (Date  Time): 02/19/2021; 1025 hrs.   Primary Care Physician: Idelle Crouch, MD Location: [facility] Note by: Gaspar Cola, MD Date: 02/19/2021; Time: 4:20 PM  Disclaimer:  Medicine is not an Chief Strategy Officer. The only guarantee in medicine is that nothing is guaranteed. It is important to note that the decision to proceed with this intervention was based on the information collected from the patient. The Data and conclusions were drawn from the patient's questionnaire, the interview, and the physical examination. Because the information was provided in large part by the patient, it cannot be guaranteed that it has not been purposely or unconsciously manipulated. Every effort has been made to obtain as much  relevant data as possible for this evaluation. It is important to note that the conclusions that lead to this procedure are derived in large part from the available data. Always take into account that the treatment will also be dependent on availability of resources and existing treatment guidelines, considered by other Pain Management Practitioners as being common knowledge and practice, at the time of the intervention. For Medico-Legal purposes, it is also important to point out that variation in procedural techniques and pharmacological choices are the acceptable norm. The indications, contraindications, technique, and results of the above procedure should only be interpreted and judged by a Board-Certified Interventional Pain Specialist with extensive familiarity and expertise in the same exact procedure and technique.

## 2021-02-19 NOTE — Patient Instructions (Signed)
____________________________________________________________________________________________  Post-Procedure Discharge Instructions  Instructions: Apply ice:  Purpose: This will minimize any swelling and discomfort after procedure.  When: Day of procedure, as soon as you get home. How: Fill a plastic sandwich bag with crushed ice. Cover it with a small towel and apply to injection site. How long: (15 min on, 15 min off) Apply for 15 minutes then remove x 15 minutes.  Repeat sequence on day of procedure, until you go to bed. Apply heat:  Purpose: To treat any soreness and discomfort from the procedure. When: Starting the next day after the procedure. How: Apply heat to procedure site starting the day following the procedure. How long: May continue to repeat daily, until discomfort goes away. Food intake: Start with clear liquids (like water) and advance to regular food, as tolerated.  Physical activities: Keep activities to a minimum for the first 8 hours after the procedure. After that, then as tolerated. Driving: If you have received any sedation, be responsible and do not drive. You are not allowed to drive for 24 hours after having sedation. Blood thinner: (Applies only to those taking blood thinners) You may restart your blood thinner 6 hours after your procedure. Insulin: (Applies only to Diabetic patients taking insulin) As soon as you can eat, you may resume your normal dosing schedule. Infection prevention: Keep procedure site clean and dry. Shower daily and clean area with soap and water. Post-procedure Pain Diary: Extremely important that this be done correctly and accurately. Recorded information will be used to determine the next step in treatment. For the purpose of accuracy, follow these rules: Evaluate only the area treated. Do not report or include pain from an untreated area. For the purpose of this evaluation, ignore all other areas of pain, except for the treated area. After  your procedure, avoid taking a long nap and attempting to complete the pain diary after you wake up. Instead, set your alarm clock to go off every hour, on the hour, for the initial 8 hours after the procedure. Document the duration of the numbing medicine, and the relief you are getting from it. Do not go to sleep and attempt to complete it later. It will not be accurate. If you received sedation, it is likely that you were given a medication that may cause amnesia. Because of this, completing the diary at a later time may cause the information to be inaccurate. This information is needed to plan your care. Follow-up appointment: Keep your post-procedure follow-up evaluation appointment after the procedure (usually 2 weeks for most procedures, 6 weeks for radiofrequencies). DO NOT FORGET to bring you pain diary with you.   Expect: (What should I expect to see with my procedure?) From numbing medicine (AKA: Local Anesthetics): Numbness or decrease in pain. You may also experience some weakness, which if present, could last for the duration of the local anesthetic. Onset: Full effect within 15 minutes of injected. Duration: It will depend on the type of local anesthetic used. On the average, 1 to 8 hours.  From steroids (Applies only if steroids were used): Decrease in swelling or inflammation. Once inflammation is improved, relief of the pain will follow. Onset of benefits: Depends on the amount of swelling present. The more swelling, the longer it will take for the benefits to be seen. In some cases, up to 10 days. Duration: Steroids will stay in the system x 2 weeks. Duration of benefits will depend on multiple posibilities including persistent irritating factors. Side-effects: If present, they  may typically last 2 weeks (the duration of the steroids). Frequent: Cramps (if they occur, drink Gatorade and take over-the-counter Magnesium 450-500 mg once to twice a day); water retention with temporary  weight gain; increases in blood sugar; decreased immune system response; increased appetite. Occasional: Facial flushing (red, warm cheeks); mood swings; menstrual changes. Uncommon: Long-term decrease or suppression of natural hormones; bone thinning. (These are more common with higher doses or more frequent use. This is why we prefer that our patients avoid having any injection therapies in other practices.)  Very Rare: Severe mood changes; psychosis; aseptic necrosis. From procedure: Some discomfort is to be expected once the numbing medicine wears off. This should be minimal if ice and heat are applied as instructed.  Call if: (When should I call?) You experience numbness and weakness that gets worse with time, as opposed to wearing off. New onset bowel or bladder incontinence. (Applies only to procedures done in the spine)  Emergency Numbers: Durning business hours (Monday - Thursday, 8:00 AM - 4:00 PM) (Friday, 9:00 AM - 12:00 Noon): [phone] After hours: [phone] NOTE: If you are having a problem and are unable connect with, or to talk to a provider, then go to your nearest urgent care or emergency department. If the problem is serious and urgent, please call 911. ____________________________________________________________________________________________   Triamcinolone extended-release injection What is this medicine? TRIAMCINOLONE (trye am SIN [place]) is a corticosteroid. It helps to reduce swelling and treat pain. This medicine is used to treat arthritis in the knee. This medicine may be used for other purposes; ask your health care provider or pharmacist if you have questions. COMMON BRAND NAME(S): Zilretta What should I tell my health care provider before I take this medicine? They need to know if you have any of these conditions: Cushing's syndrome diabetes glaucoma heart disease high blood pressure infection, like tuberculosis, herpes, measles, chickenpox, or  fungal infection liver disease low levels of potassium in the blood mental illness myasthenia gravis recent heart attack seizures stomach or intestine disease thyroid disease an unusual or allergic reaction to triamcinolone, corticosteroids, benzyl alcohol, other medicines, foods, dyes, or preservatives pregnant or trying to get pregnant breast-feeding How should I use this medicine? This medicine is injected into the joint by a health care professional. After your dose follow your doctor's instructions for your care. Contact your pediatrician regarding the use of this medicine in children. Special care may be needed. Overdosage: If you think you have taken too much of this medicine contact a poison control center or emergency room at once. NOTE: This medicine is only for you. Do not share this medicine with others. What if I miss a dose? This does not apply. What may interact with this medicine? antiviral medicines for HIV or AIDS aspirin certain medicines for fungal infections like ketoconazole and itraconazole clarithromycin mifepristone nefazodone other steroid medicines vaccines and other immunization products This list may not describe all possible interactions. Give your health care provider a list of all the medicines, herbs, non-prescription drugs, or dietary supplements you use. Also tell them if you smoke, drink alcohol, or use illegal drugs. Some items may interact with your medicine. What should I watch for while using this medicine? Visit your doctor or health care professional for regular checks on your progress. If you are taking this medicine for a long time, carry an identification card with your name, the type and dose of medicine, and your doctor's name and address. Do not come in  contact with people who have chickenpox or the measles while you are taking this medicine. If you do, call your doctor right away. This medicine may increase blood sugar. Ask your  healthcare provider if changes in diet or medicines are needed if you have diabetes. What side effects may I notice from receiving this medicine? Side effects that you should report to your doctor or health care professional as soon as possible: allergic reactions like skin rash, itching or hives, swelling of the face, lips, or tongue bloody or black, tarry stools changes in emotions or moods excessive hair growth on the face or body eye pain increased blood pressure increased joint pain and swelling at site where injected lumpy, thin skin at site where injected rounding of face seizures signs and symptoms of high blood sugar such as being more thirsty or hungry or having to urinate more than normal. You may also feel very tired or have blurry vision. signs and symptoms of infection like fever or chills; cough; sore throat; pain or trouble passing urine sores that do not heal stomach pain swelling of ankles, feet, hands trouble sleeping unusual bruising or bleeding Side effects that usually do not require medical attention (report these to your doctor or health care professional if they continue or are bothersome): headache nausea pain, redness, or irritation at site where injected upset stomach weight gain This list may not describe all possible side effects. Call your doctor for medical advice about side effects. You may report side effects to FDA at 1-800-FDA-1088. Where should I keep my medicine? This drug is given in a hospital or clinic and will not be stored at home. NOTE: This sheet is a summary. It may not cover all possible information. If you have questions about this medicine, talk to your doctor, pharmacist, or health care provider.  2021 [employer] (2018-06-01 11:00:49)

## 2021-02-19 NOTE — Progress Notes (Signed)
Safety precautions to be maintained throughout the outpatient stay will include: orient to surroundings, keep bed in low position, maintain call bell within reach at all times, provide assistance with transfer out of bed and ambulation.  

## 2021-02-20 ENCOUNTER — Telehealth: Payer: Self-pay

## 2021-02-20 NOTE — Telephone Encounter (Signed)
Denies any needs PP . Instructed to call if needed.

## 2021-02-23 DIAGNOSIS — J069 Acute upper respiratory infection, unspecified: Secondary | ICD-10-CM | POA: Diagnosis not present

## 2021-02-23 DIAGNOSIS — R0602 Shortness of breath: Secondary | ICD-10-CM | POA: Diagnosis not present

## 2021-02-23 DIAGNOSIS — Z03818 Encounter for observation for suspected exposure to other biological agents ruled out: Secondary | ICD-10-CM | POA: Diagnosis not present

## 2021-03-03 ENCOUNTER — Encounter: Payer: Self-pay | Admitting: Pain Medicine

## 2021-03-03 NOTE — Progress Notes (Signed)
Patient: Karen Lewis  Service Category: E/M  Provider: Gaspar Cola, MD  DOB: 11/22/1970  DOS: 03/04/2021  Location: Office  MRN: [id number]  Setting: Ambulatory outpatient  Referring Provider: Idelle Crouch, MD  Type: Established Patient  Specialty: Interventional Pain Management  PCP: Idelle Crouch, MD  Location: Remote location  Delivery: TeleHealth     Virtual Encounter - Pain Management PROVIDER NOTE: Information contained herein reflects review and annotations entered in association with encounter. Interpretation of such information and data should be left to medically-trained personnel. Information provided to patient can be located elsewhere in the medical record under "Patient Instructions". Document created using STT-dictation technology, any transcriptional errors that may result from process are unintentional.    Contact & Pharmacy Preferred: [phone] Home: [phone] (home) Mobile: [phone] (mobile) E-mail: [email or web address]  [place], [place] - [address] [place] [address] Phone: [phone] Fax: [phone]   Pre-screening  Ms. Service offered "in-person" vs "virtual" encounter. She indicated preferring virtual for this encounter.   Reason COVID-19*  Social distancing based on CDC and AMA recommendations.   I contacted Karen Lewis on 03/04/2021 via telephone.      I clearly identified myself as Gaspar Cola, MD. I verified that I was speaking with the correct person using two identifiers (Name: Karen Lewis, and date of birth: [birth year]).  Consent I sought verbal advanced consent from Karen Lewis for virtual visit interactions. I informed Karen Lewis of possible security and privacy concerns, risks, and limitations associated with providing "not-in-person" medical evaluation and management services. I also informed Karen Lewis of the availability of "in-person" appointments. Finally, I informed  her that there would be a charge for the virtual visit and that she could be  personally, fully or partially, financially responsible for it. Karen Lewis expressed understanding and agreed to proceed.   Historic Elements   Karen Lewis is a 49 y.o. year old, female patient evaluated today after our last contact on 02/19/2021. Karen Lewis  has a past medical history of Anemia, Anginal pain ([place]), Anxiety, Arthralgia of hip (07/29/2015), Arthritis, Arthritis, degenerative (07/29/2015), Asthma, Cephalalgia (07/25/2014), Dependence on unknown drug ([place]), Depression, Diabetes mellitus without complication ([place]), Difficult intubation, Dysrhythmia, Eczema, Fibromyalgia, Gastritis, GERD (gastroesophageal reflux disease), Gonalgia (07/29/2015), Gout, H/O cardiovascular disorder (03/10/2015), H/O surgical procedure (12/05/2012), H/O thyroid disease (03/10/2015), Headache, Herpes, History of artificial joint (07/29/2015), History of hiatal hernia, Hypertension, Hypomagnesemia, Hypothyroidism, IDA (iron deficiency anemia) (05/28/2019), LBP (low back pain) (07/29/2015), Neuromuscular disorder ([place]), Obesity, PCOS (polycystic ovarian syndrome), Primary osteoarthritis of both knees (07/29/2015), Sleep apnea, Thyroid nodule (bilateral), and Umbilical hernia. She also  has a past surgical history that includes Laparoscopic partial gastrectomy; Shoulder arthroscopy (Right); Carpal tunnel release (Bilateral); Diagnostic laparoscopy; Cholecystectomy; Trigger finger release (Right); Thyroidectomy (N/A, 11/12/2015); left trigger finger; Roux-en-Y Gastric Bypass (06/03/2017); Hiatal hernia repair; peniculectomy (N/A, 07/05/2018); Total hip arthroplasty (Right, 11/27/2019); Joint replacement (Bilateral, hip); Appendectomy; Trigger finger release (Right, 04/24/2020); Colonoscopy with propofol (N/A, 02/11/2021); and Esophagogastroduodenoscopy (egd) with propofol (N/A, 02/11/2021). Karen Lewis has a current medication list which includes the following  prescription(s): accu-chek aviva plus, acetaminophen, acidophilus, albuterol, amitriptyline, vitamin c, biotin, butalbital-acetaminophen-caffeine, calcium carbonate, clonazepam, b-12, diclofenac sodium, diphenhydramine, dupixent, emgality, epinephrine, ergocalciferol, famotidine, furosemide, accu-chek aviva plus, guaifenesin-codeine, levocetirizine, magnesium oxide, meclizine, metformin, metoprolol tartrate, montelukast, tab-a-vite, naloxone, naphazoline-pheniramine, nitrofurantoin (macrocrystal-monohydrate), omeprazole, oxycodone, [START ON 03/29/2021] oxycodone, ozempic (0.25 or 0.5 mg/dose), quetiapine, rizatriptan, rizatriptan, rosuvastatin, sertraline, spironolactone, tizanidine,  topiramate, topiramate, zolpidem, zonisamide, levothyroxine, lisinopril, oxycodone, and pregabalin. She  reports that she has never smoked. She has never used smokeless tobacco. She reports that she does not drink alcohol and does not use drugs. Karen Lewis is allergic to bactrim [sulfamethoxazole-trimethoprim], omalizumab, ciprofloxacin, shellfish allergy, and nsaids.   HPI  Today, she is being contacted for a post-procedure assessment.  The patient indicates having attained 100% relief of the pain in both knees that seems to be ongoing.  She now wants [place] to work on her lower back.  She refers having a recurrence of her usual low back pain, bilaterally, with pain running down the back of the legs to the buttocks area and occasionally down the side of the leg but not below the level of the knee.  In the past we had done some palliative Lumbar facet blocks and she would like to have that repeated.  RTCB: 04/28/2021 Nonopioid transferred 06/24/2020: Lyrica  Post-Procedure Evaluation  Procedure (02/19/2021): Palliative bilateral intra-articular steroid (Zilretta) knee injection under fluoroscopic guidance and IV sedation Pre-procedure pain level: 5/10 Post-procedure: 0/10 (100% relief)  Sedation: Sedation provided.  Effectiveness  during initial hour after procedure(Ultra-Short Term Relief): 100 %.  Local anesthetic used: Long-acting (4-6 hours) Effectiveness: Defined as any analgesic benefit obtained secondary to the administration of local anesthetics. This carries significant diagnostic value as to the etiological location, or anatomical origin, of the pain. Duration of benefit is expected to coincide with the duration of the local anesthetic used.  Effectiveness during initial 4-6 hours after procedure(Short-Term Relief): 100 %.  Long-term benefit: Defined as any relief past the pharmacologic duration of the local anesthetics.  Effectiveness past the initial 6 hours after procedure(Long-Term Relief): 100 %.  Current benefits: Defined as benefit that persist at this time.   Analgesia:   The patient indicates currently enjoying 100% relief of her knee pain.  She is impressed with the results from the [place]. Function: Karen Lewis reports improvement in function ROM: Karen Lewis reports improvement in ROM  Pharmacotherapy Assessment  Analgesic: Oxycodone IR 5 mg 1 tab PO 5X/day (#150/mo) (25 mg/day) MME/day: 37.5 mg/day.   Monitoring: [place] PMP: PDMP reviewed during this encounter.       Pharmacotherapy: No side-effects or adverse reactions reported. Compliance: No problems identified. Effectiveness: Clinically acceptable. Plan: Refer to "POC".  UDS:  Summary  Date Value Ref Range Status  02/19/2020 Note  Final    Comment:    ==================================================================== [employer] Select 13 (MW) ==================================================================== Test                             Result       Flag       Units  Drug Present and Declared for Prescription Verification   7-aminoclonazepam              44           EXPECTED   ng/mg creat    7-aminoclonazepam is an expected metabolite of clonazepam. Source of    clonazepam is a scheduled prescription medication.  Drug Present  not Declared for Prescription Verification   Oxycodone                      199          UNEXPECTED ng/mg creat   Oxymorphone                    127  UNEXPECTED ng/mg creat   Noroxycodone                   1026         UNEXPECTED ng/mg creat   Noroxymorphone                 56           UNEXPECTED ng/mg creat    Sources of oxycodone are scheduled prescription medications.    Oxymorphone, noroxycodone, and noroxymorphone are expected    metabolites of oxycodone. Oxymorphone is also available as a    scheduled prescription medication.  Drug Absent but Declared for Prescription Verification   Butalbital                     Not Detected UNEXPECTED ==================================================================== Test                      Result    Flag   Units      Ref Range   Creatinine              165              mg/dL      >=20 ==================================================================== Declared Medications:  The flagging and interpretation on this report are based on the  following declared medications.  Unexpected results may arise from  inaccuracies in the declared medications.   **Note: The testing scope of this panel includes these medications:   Butalbital (Fioricet)  Clonazepam (Klonopin)   **Note: The testing scope of this panel does not include the  following reported medications:   Acetaminophen (Tylenol)  Acetaminophen (Fioricet)  Albuterol (Ventolin HFA)  Amitriptyline (Elavil)  Biotin  Caffeine (Fioricet)  Calcium (Tums)  Diphenhydramine (Benadryl)  Epinephrine (EpiPen)  Eye Drops  Famotidine (Pepcid)  Levocetirizine (Xyzal)  Levothyroxine (Synthroid)  Lisinopril (Zestril)  Magnesium (Mag-Ox)  Meclizine (Antivert)  Medroxyprogesterone (Depo-Provera)  Metformin (Glucophage)  Metoprolol (Lopressor)  Montelukast (Singulair)  Multivitamin  Naloxone (Narcan)  Pregabalin (Lyrica)  Probiotic  Quetiapine (Seroquel)  Rosuvastatin  (Crestor)  Sertraline (Zoloft)  Spironolactone (Aldactone)  Tizanidine (Zanaflex)  Topical  Topical Diclofenac  Triamcinolone (Nasacort)  Vitamin B  Vitamin B12  Vitamin C  Vitamin D2  Zolpidem (Ambien)  Zonisamide (Zonegran) ==================================================================== For clinical consultation, please call [phone]. ====================================================================     Laboratory Chemistry Profile   Renal Lab Results  Component Value Date   BUN 17 10/17/2020   CREATININE 0.95 10/17/2020   GFRAA >60 04/16/2020   GFRNONAA >60 10/17/2020     Hepatic Lab Results  Component Value Date   AST 40 10/17/2020   ALT 43 10/17/2020   ALBUMIN 3.6 10/17/2020   ALKPHOS 60 10/17/2020     Electrolytes Lab Results  Component Value Date   NA 137 10/17/2020   K 4.5 10/17/2020   CL 104 10/17/2020   CALCIUM 8.3 (L) 10/17/2020   MG 1.7 12/11/2019   PHOS 5.5 (H) 05/22/2019     Bone Lab Results  Component Value Date   VD25OH 27.4 (L) 11/24/2015   VD125OH2TOT 41.5 11/24/2015     Inflammation (CRP: Acute Phase) (ESR: Chronic Phase) Lab Results  Component Value Date   CRP 0.7 09/27/2019   ESRSEDRATE 30 (H) 09/27/2019       Note: Above Lab results reviewed.  Imaging  DG PAIN CLINIC C-ARM 1-60 MIN NO REPORT Fluoro was used, but no Radiologist interpretation will be provided.  Please refer to "  NOTES" tab for provider progress note.  Assessment  The primary encounter diagnosis was Lumbar facet syndrome (Bilateral) (R>L). Diagnoses of Chronic low back pain (3ry area of Pain) (Bilateral) (R>L) w/o sciatica, Grade 1 (1.4 cm) Anterolisthesis of L4 over L5, Chronic knee pain (1ry area of Pain) (Bilateral) (R>L), Secondary Osteoarthritis of knee (Bilateral) (R>L), and Chronic pain syndrome were also pertinent to this visit.  Plan of Care  Problem-specific:  No problem-specific Assessment & Plan notes found for this  encounter.  Karen Lewis has a current medication list which includes the following long-term medication(s): albuterol, clonazepam, diphenhydramine, furosemide, magnesium oxide, metformin, metoprolol tartrate, montelukast, naloxone, oxycodone, [START ON 03/29/2021] oxycodone, rizatriptan, rizatriptan, spironolactone, zonisamide, levothyroxine, lisinopril, oxycodone, and pregabalin.  Pharmacotherapy (Medications Ordered): No orders of the defined types were placed in this encounter.  Orders:  Orders Placed This Encounter  Procedures   LUMBAR FACET(MEDIAL BRANCH NERVE BLOCK) MBNB    Standing Status:   Future    Standing Expiration Date:   04/03/2021    Scheduling Instructions:     Procedure: Lumbar facet block (AKA.: Lumbosacral medial branch nerve block)     Side: Bilateral     Level: L3-4, L4-5, & L5-S1 Facets (L2, L3, L4, L5, & S1 Medial Branch Nerves)     Sedation: Patient's choice.     Timeframe: ASAA    Order Specific Question:   Where will this procedure be performed?    Answer:   [facility]    Follow-up plan:   Return for Procedure (w/ sedation): (B) L-FCT BLK.     Interventional Therapies  Risk  Complexity Considerations:   Estimated body mass index is 62.48 kg/m as calculated from the following:   Height as of this encounter: '5\' 4"'  (1.626 m).   Weight as of this encounter: 364 lb (165.1 kg). NOTE: NO RFA until BMI less or equal to 35.  (Gastric bypass done on 06/03/2017) Iodine allergy  Contrast dye allergy  Shellfish allergy    Planned  Pending:   Palliative bilateral IA steroid knee injection #1  Palliative bilateral lumbar facet block #5    Under consideration:   Diagnostic bilateral genicular nerve block    Completed:   Therapeutic bilateral IA Hyalgan knee injection x16 (06/24/2020)  Therapeutic bilateral THR (total hip replacements) (Dr. Rudene Christians) (Right: 11/27/2019) (Left: 07/29/2015)  Therapeutic bilateral lumbar facet MBB x4 (03/07/2018)   Therapeutic right IA hip injection x3 (07/07/2017)  Therapeutic left IA shoulder (glenohumeral joint) injection x1 (05/31/2017)    Therapeutic  Palliative (PRN) options:   Palliative bilateral IA Hyalgan knee injections  Palliative bilateral lumbar facet blocks     Recent Visits Date Type Provider Dept  02/19/21 Procedure visit Milinda Pointer, MD [facility]  01/21/21 Office Visit Milinda Pointer, MD [facility]  Showing recent visits within past 90 days and meeting all other requirements Today's Visits Date Type Provider Dept  03/04/21 Telemedicine Milinda Pointer, MD [facility]  Showing today's visits and meeting all other requirements Future Appointments Date Type Provider Dept  04/22/21 Appointment Milinda Pointer, MD [facility]  Showing future appointments within next 90 days and meeting all other requirements I discussed the assessment and treatment plan with the patient. The patient was provided an opportunity to ask questions and all were answered. The patient agreed with the plan and demonstrated an understanding of the instructions.  Patient advised to call back or seek an in-person evaluation if the symptoms or condition worsens.  Duration of encounter: 15 minutes.  Note by: Gaspar Cola, MD Date: 03/04/2021; Time: 6:17 PM

## 2021-03-04 ENCOUNTER — Other Ambulatory Visit: Payer: Self-pay

## 2021-03-04 ENCOUNTER — Ambulatory Visit: Payer: Medicare HMO | Attending: Pain Medicine | Admitting: Pain Medicine

## 2021-03-04 DIAGNOSIS — G894 Chronic pain syndrome: Secondary | ICD-10-CM

## 2021-03-04 DIAGNOSIS — G8929 Other chronic pain: Secondary | ICD-10-CM | POA: Diagnosis not present

## 2021-03-04 DIAGNOSIS — M25562 Pain in left knee: Secondary | ICD-10-CM

## 2021-03-04 DIAGNOSIS — M174 Other bilateral secondary osteoarthritis of knee: Secondary | ICD-10-CM

## 2021-03-04 DIAGNOSIS — M545 Low back pain, unspecified: Secondary | ICD-10-CM

## 2021-03-04 DIAGNOSIS — M25561 Pain in right knee: Secondary | ICD-10-CM

## 2021-03-04 DIAGNOSIS — M47816 Spondylosis without myelopathy or radiculopathy, lumbar region: Secondary | ICD-10-CM | POA: Diagnosis not present

## 2021-03-04 DIAGNOSIS — M4310 Spondylolisthesis, site unspecified: Secondary | ICD-10-CM

## 2021-03-04 NOTE — Patient Instructions (Signed)
____________________________________________________________________________________________  Virtual Visits   What is a "Virtual Visit"? It is a healthcare communication encounter (medical visit) that takes place on real time (NOT TEXT or E-MAIL) over the telephone or computer device (desktop, laptop, tablet, smart phone, etc.). It allows for more location flexibility between the patient and the healthcare provider.  Who decides when these types of visits will be used? The physician.  Who is eligible for these types of visits? Only those patients that can be reliably reached over the telephone.  What do you mean by reliably? We do not have time to call everyone multiple times, therefore those that tend to screen calls and then call back later are not suitable candidates for this system. We understand how people are reluctant to pickup on "unknown" calls, therefore, we suggest adding our telephone numbers to your list of "CONTACT(s)". This way, you should be able to readily identify our calls when you receive one. All of our numbers are available below.   Who is not eligible? This option is not available for medication management encounters, specially for controlled substances. Patients on pain medications that fall under the category of controlled substances have to come in for "Face-to-Face" encounters. This is required for mandatory monitoring of these substances. You may be asked to provide a sample for an unannounced urine drug screening test (UDS), and we will need to count your pain pills. Not bringing your pills to be counted may result in no refill. Obviously, neither one of these can be done over the phone.  When will this type of visits be used? You can request a virtual visit whenever you are physically unable to attend a regular appointment. The decision will be made by the physician (or healthcare provider) on a case by case basis.   At what time will I be called? This is an  excellent question. The providers will try to call you whenever they have time available. Do not expect to be called at any specific time. The secretaries will assign you a time for your virtual visit appointment, but this is done simply to keep a list of those patients that need to be called, but not for the purpose of keeping a time schedule. Be advised that the call may come in anytime during the day, between the hours of 8:00 AM and 8::00 PM, depending on provider availability. We do understand that the system is not perfect. If you are unable to be available that day on a moments notice, then request an "in-person" appointment rather than a "virtual visit".  Can I request my medication visits to be "Virtual"? Yes you may request it, but the decision is entirely up to the healthcare provider. Control substances require specific monitoring that requires Face-to-Face encounters. The number of encounters  and the extent of the monitoring is determined on a case by case basis.  Add a new contact to your smart phone and label it "PAIN CLINIC" Under this contact add the following numbers: Main: [phone] (Official Contact Number) Nurses: [phone] (These are outgoing only calling systems. Do not call this number.) Dr. Saumya Hukill: [phone] or [phone] (Outgoing calls only. Do not call this number.)  ____________________________________________________________________________________________   

## 2021-03-05 ENCOUNTER — Telehealth: Payer: Medicare HMO | Admitting: Pain Medicine

## 2021-03-13 ENCOUNTER — Other Ambulatory Visit: Payer: Self-pay

## 2021-03-13 NOTE — Patient Outreach (Signed)
[facility]) Care Management  03/13/2021  Karen Lewis 04-16-1971 [id number]   Telephone call to patient for disease management follow up.   No answer.  HIPAA compliant voice message left.    Plan: If no return call, RN CM will attempt patient again in October.   Jone Baseman, RN, MSN [facility] Care Management Coordinator Direct Line [phone] Cell [phone] Toll Free: [phone]  Fax: [phone]

## 2021-03-18 DIAGNOSIS — E1169 Type 2 diabetes mellitus with other specified complication: Secondary | ICD-10-CM | POA: Diagnosis not present

## 2021-03-18 DIAGNOSIS — R519 Headache, unspecified: Secondary | ICD-10-CM | POA: Diagnosis not present

## 2021-03-18 DIAGNOSIS — I890 Lymphedema, not elsewhere classified: Secondary | ICD-10-CM | POA: Diagnosis not present

## 2021-03-18 DIAGNOSIS — F40298 Other specified phobia: Secondary | ICD-10-CM | POA: Diagnosis not present

## 2021-03-18 DIAGNOSIS — R0789 Other chest pain: Secondary | ICD-10-CM | POA: Diagnosis not present

## 2021-03-18 DIAGNOSIS — E785 Hyperlipidemia, unspecified: Secondary | ICD-10-CM | POA: Diagnosis not present

## 2021-03-18 DIAGNOSIS — H53149 Visual discomfort, unspecified: Secondary | ICD-10-CM | POA: Diagnosis not present

## 2021-03-18 DIAGNOSIS — R110 Nausea: Secondary | ICD-10-CM | POA: Diagnosis not present

## 2021-03-18 DIAGNOSIS — G4440 Drug-induced headache, not elsewhere classified, not intractable: Secondary | ICD-10-CM | POA: Diagnosis not present

## 2021-03-18 DIAGNOSIS — R002 Palpitations: Secondary | ICD-10-CM | POA: Diagnosis not present

## 2021-03-20 ENCOUNTER — Ambulatory Visit: Payer: Self-pay

## 2021-03-31 ENCOUNTER — Ambulatory Visit: Payer: Medicare HMO | Admitting: Pain Medicine

## 2021-03-31 DIAGNOSIS — J069 Acute upper respiratory infection, unspecified: Secondary | ICD-10-CM | POA: Diagnosis not present

## 2021-03-31 DIAGNOSIS — J4521 Mild intermittent asthma with (acute) exacerbation: Secondary | ICD-10-CM | POA: Diagnosis not present

## 2021-04-13 NOTE — Progress Notes (Signed)
PROVIDER NOTE: Information contained herein reflects review and annotations entered in association with encounter. Interpretation of such information and data should be left to medically-trained personnel. Information provided to patient can be located elsewhere in the medical record under "Patient Instructions". Document created using STT-dictation technology, any transcriptional errors that may result from process are unintentional.    Patient: Karen Lewis  Service Category: Procedure  Provider: Gaspar Cola, MD  DOB: 06/14/1971  DOS: 04/14/2021  Location: [facility]  MRN: [id number]  Setting: Ambulatory - outpatient  Referring Provider: Idelle Crouch, MD  Type: Established Patient  Specialty: Interventional Pain Management  PCP: Idelle Crouch, MD   Primary Reason for Visit: Interventional Pain Management Treatment. CC: Back Pain  Procedure:          Anesthesia, Analgesia, Anxiolysis:  Type: Lumbar Facet, Medial Branch Block(s)          Primary Purpose: Diagnostic Region: Posterolateral Lumbosacral Spine Level: L2, L3, L4, L5, & S1 Medial Branch Level(s). Injecting these levels blocks the L3-4, L4-5, and L5-S1 lumbar facet joints. Laterality: Bilateral  Type: Minimal (Conscious) Anxiolysis combined with Local Anesthesia Indication(s): Analgesia and Anxiety Route: Intravenous (IV) IV Access: Secured Sedation: Meaningful verbal contact was maintained at all times during the procedure  Local Anesthetic: Lidocaine 1-2%  Position: Prone   Indications: 1. Lumbar facet syndrome (Bilateral) (R>L)   2. Spondylosis without myelopathy or radiculopathy, lumbosacral region   3. Grade 1 (1.4 cm) Anterolisthesis of L4 over L5   4. DDD (degenerative disc disease), lumbosacral   5. Chronic low back pain (3ry area of Pain) (Bilateral) (R>L) w/o sciatica   6. Secondary osteoarthritis of multiple sites   7. Class 3 severe obesity due to excess calories with serious  comorbidity and body mass index (BMI) of 60.0 to 69.9 in adult ([place])   8. Allergy to iodine    Pain Score: Pre-procedure: 4 /10 Post-procedure: 0-No pain/10   Pre-op H&P Assessment:  Karen Lewis is a 50 y.o. (year old), female patient, seen today for interventional treatment. She  has a past surgical history that includes Laparoscopic partial gastrectomy; Shoulder arthroscopy (Right); Carpal tunnel release (Bilateral); Diagnostic laparoscopy; Cholecystectomy; Trigger finger release (Right); Thyroidectomy (N/A, 11/12/2015); left trigger finger; Roux-en-Y Gastric Bypass (06/03/2017); Hiatal hernia repair; peniculectomy (N/A, 07/05/2018); Total hip arthroplasty (Right, 11/27/2019); Joint replacement (Bilateral, hip); Appendectomy; Trigger finger release (Right, 04/24/2020); Colonoscopy with propofol (N/A, 02/11/2021); and Esophagogastroduodenoscopy (egd) with propofol (N/A, 02/11/2021). Karen Lewis has a current medication list which includes the following prescription(s): accu-chek aviva plus, acetaminophen, acidophilus, albuterol, amitriptyline, vitamin c, biotin, butalbital-acetaminophen-caffeine, calcium carbonate, clonazepam, b-12, diclofenac sodium, diphenhydramine, dupixent, emgality, epinephrine, ergocalciferol, famotidine, fluticasone furoate-vilanterol, furosemide, accu-chek aviva plus, guaifenesin-codeine, levocetirizine, magnesium oxide, meclizine, metformin, metoprolol tartrate, montelukast, tab-a-vite, naloxone, naphazoline-pheniramine, nitrofurantoin (macrocrystal-monohydrate), omeprazole, oxycodone, ozempic (0.25 or 0.5 mg/dose), quetiapine, rizatriptan, rizatriptan, rosuvastatin, sertraline, spironolactone, tizanidine, topiramate, zolpidem, zonisamide, levothyroxine, lisinopril, oxycodone, oxycodone, pregabalin, and topiramate, and the following Facility-Administered Medications: midazolam. Her primarily concern today is the Back Pain  Initial Vital Signs:  Pulse/HCG Rate: 92ECG Heart Rate: 94 Temp: (!)  97 F (36.1 C) Resp: 15 BP: 115/70 SpO2: 97 %  BMI: Estimated body mass index is 59.91 kg/m as calculated from the following:   Height as of this encounter: '5\' 4"'$  (1.626 m).   Weight as of this encounter: 349 lb (158.3 kg).  Risk Assessment: Allergies: Reviewed. She is allergic to bactrim [sulfamethoxazole-trimethoprim], omalizumab, ciprofloxacin, shellfish allergy, and nsaids.  Allergy Precautions: None required  Coagulopathies: Reviewed. None identified.  Blood-thinner therapy: None at this time Active Infection(s): Reviewed. None identified. Karen Lewis is afebrile  Site Confirmation: Karen Lewis was asked to confirm the procedure and laterality before marking the site Procedure checklist: Completed Consent: Before the procedure and under the influence of no sedative(s), amnesic(s), or anxiolytics, the patient was informed of the treatment options, risks and possible complications. To fulfill our ethical and legal obligations, as recommended by the American Medical Association's Code of Ethics, I have informed the patient of my clinical impression; the nature and purpose of the treatment or procedure; the risks, benefits, and possible complications of the intervention; the alternatives, including doing nothing; the risk(s) and benefit(s) of the alternative treatment(s) or procedure(s); and the risk(s) and benefit(s) of doing nothing. The patient was provided information about the general risks and possible complications associated with the procedure. These may include, but are not limited to: failure to achieve desired goals, infection, bleeding, organ or nerve damage, allergic reactions, paralysis, and death. In addition, the patient was informed of those risks and complications associated to Spine-related procedures, such as failure to decrease pain; infection (i.e.: Meningitis, epidural or intraspinal abscess); bleeding (i.e.: epidural hematoma, subarachnoid hemorrhage, or any other type of  intraspinal or peri-dural bleeding); organ or nerve damage (i.e.: Any type of peripheral nerve, nerve root, or spinal cord injury) with subsequent damage to sensory, motor, and/or autonomic systems, resulting in permanent pain, numbness, and/or weakness of one or several areas of the body; allergic reactions; (i.e.: anaphylactic reaction); and/or death. Furthermore, the patient was informed of those risks and complications associated with the medications. These include, but are not limited to: allergic reactions (i.e.: anaphylactic or anaphylactoid reaction(s)); adrenal axis suppression; blood sugar elevation that in diabetics may result in ketoacidosis or comma; water retention that in patients with history of congestive heart failure may result in shortness of breath, pulmonary edema, and decompensation with resultant heart failure; weight gain; swelling or edema; medication-induced neural toxicity; particulate matter embolism and blood vessel occlusion with resultant organ, and/or nervous system infarction; and/or aseptic necrosis of one or more joints. Finally, the patient was informed that Medicine is not an exact science; therefore, there is also the possibility of unforeseen or unpredictable risks and/or possible complications that may result in a catastrophic outcome. The patient indicated having understood very clearly. We have given the patient no guarantees and we have made no promises. Enough time was given to the patient to ask questions, all of which were answered to the patient's satisfaction. Ms. Liebel has indicated that she wanted to continue with the procedure. Attestation: I, the ordering provider, attest that I have discussed with the patient the benefits, risks, side-effects, alternatives, likelihood of achieving goals, and potential problems during recovery for the procedure that I have provided informed consent. Date  Time: 04/14/2021 11:11 AM  Pre-Procedure Preparation:  Monitoring: As  per clinic protocol. Respiration, ETCO2, SpO2, BP, heart rate and rhythm monitor placed and checked for adequate function Safety Precautions: Patient was assessed for positional comfort and pressure points before starting the procedure. Time-out: I initiated and conducted the "Time-out" before starting the procedure, as per protocol. The patient was asked to participate by confirming the accuracy of the "Time Out" information. Verification of the correct person, site, and procedure were performed and confirmed by me, the nursing staff, and the patient. "Time-out" conducted as per Joint Commission's Universal Protocol (UP.01.01.01). Time: 1137  Description of Procedure:  Laterality: Bilateral. The procedure was performed in identical fashion on both sides. Levels:  L2, L3, L4, L5, & S1 Medial Branch Level(s) Area Prepped: Posterior Lumbosacral Region DuraPrep (Iodine Povacrylex [0.7% available iodine] and Isopropyl Alcohol, 74% w/w) Safety Precautions: Aspiration looking for blood return was conducted prior to all injections. At no point did we inject any substances, as a needle was being advanced. Before injecting, the patient was told to immediately notify me if she was experiencing any new onset of "ringing in the ears, or metallic taste in the mouth". No attempts were made at seeking any paresthesias. Safe injection practices and needle disposal techniques used. Medications properly checked for expiration dates. SDV (single dose vial) medications used. After the completion of the procedure, all disposable equipment used was discarded in the proper designated medical waste containers. Local Anesthesia: Protocol guidelines were followed. The patient was positioned over the fluoroscopy table. The area was prepped in the usual manner. The time-out was completed. The target area was identified using fluoroscopy. A 12-in long, straight, sterile hemostat was used with fluoroscopic guidance to locate  the targets for each level blocked. Once located, the skin was marked with an approved surgical skin marker. Once all sites were marked, the skin (epidermis, dermis, and hypodermis), as well as deeper tissues (fat, connective tissue and muscle) were infiltrated with a small amount of a short-acting local anesthetic, loaded on a 10cc syringe with a 25G, 1.5-in  Needle. An appropriate amount of time was allowed for local anesthetics to take effect before proceeding to the next step. Local Anesthetic: Lidocaine 2.0% The unused portion of the local anesthetic was discarded in the proper designated containers. Technical explanation of process:  L2 Medial Branch Nerve Block (MBB): The target area for the L2 medial branch is at the junction of the postero-lateral aspect of the superior articular process and the superior, posterior, and medial edge of the transverse process of L3. Under fluoroscopic guidance, a Quincke needle was inserted until contact was made with os over the superior postero-lateral aspect of the pedicular shadow (target area). After negative aspiration for blood, 0.5 mL of the nerve block solution was injected without difficulty or complication. The needle was removed intact. L3 Medial Branch Nerve Block (MBB): The target area for the L3 medial branch is at the junction of the postero-lateral aspect of the superior articular process and the superior, posterior, and medial edge of the transverse process of L4. Under fluoroscopic guidance, a Quincke needle was inserted until contact was made with os over the superior postero-lateral aspect of the pedicular shadow (target area). After negative aspiration for blood, 0.5 mL of the nerve block solution was injected without difficulty or complication. The needle was removed intact. L4 Medial Branch Nerve Block (MBB): The target area for the L4 medial branch is at the junction of the postero-lateral aspect of the superior articular process and the  superior, posterior, and medial edge of the transverse process of L5. Under fluoroscopic guidance, a Quincke needle was inserted until contact was made with os over the superior postero-lateral aspect of the pedicular shadow (target area). After negative aspiration for blood, 0.5 mL of the nerve block solution was injected without difficulty or complication. The needle was removed intact. L5 Medial Branch Nerve Block (MBB): The target area for the L5 medial branch is at the junction of the postero-lateral aspect of the superior articular process and the superior, posterior, and medial edge of the sacral ala. Under fluoroscopic guidance, a Quincke needle  was inserted until contact was made with os over the superior postero-lateral aspect of the pedicular shadow (target area). After negative aspiration for blood, 0.5 mL of the nerve block solution was injected without difficulty or complication. The needle was removed intact. S1 Medial Branch Nerve Block (MBB): The target area for the S1 medial branch is at the posterior and inferior 6 o'clock position of the L5-S1 facet joint. Under fluoroscopic guidance, the Quincke needle inserted for the L5 MBB was redirected until contact was made with os over the inferior and postero aspect of the sacrum, at the 6 o' clock position under the L5-S1 facet joint (Target area). After negative aspiration for blood, 0.5 mL of the nerve block solution was injected without difficulty or complication. The needle was removed intact.  Nerve block solution: 0.2% PF-Ropivacaine + Triamcinolone (40 mg/mL) diluted to a final concentration of 4 mg of Triamcinolone/mL of Ropivacaine The unused portion of the solution was discarded in the proper designated containers. Procedural Needles: 22-gauge, 3.5-inch, Quincke needles used for all levels.  Once the entire procedure was completed, the treated area was cleaned, making sure to leave some of the prepping solution back to take advantage of  its long term bactericidal properties.      Illustration of the posterior view of the lumbar spine and the posterior neural structures. Laminae of L2 through S1 are labeled. DPRL5, dorsal primary ramus of L5; DPRS1, dorsal primary ramus of S1; DPR3, dorsal primary ramus of L3; FJ, facet (zygapophyseal) joint L3-L4; I, inferior articular process of L4; LB1, lateral branch of dorsal primary ramus of L1; IAB, inferior articular branches from L3 medial branch (supplies L4-L5 facet joint); IBP, intermediate branch plexus; MB3, medial branch of dorsal primary ramus of L3; NR3, third lumbar nerve root; S, superior articular process of L5; SAB, superior articular branches from L4 (supplies L4-5 facet joint also); TP3, transverse process of L3.  Vitals:   04/14/21 1149 04/14/21 1157 04/14/21 1207 04/14/21 1217  BP: (!) 82/71 117/78 116/72 100/73  Pulse:      Resp: (!) '25 20 18 16  '$ Temp:  (!) 97 F (36.1 C)    SpO2: 97% 98% 96% 98%  Weight:      Height:         Start Time: 1137 hrs. End Time: 1148 hrs.  Imaging Guidance (Spinal):          Type of Imaging Technique: Fluoroscopy Guidance (Spinal) Indication(s): Assistance in needle guidance and placement for procedures requiring needle placement in or near specific anatomical locations not easily accessible without such assistance. Exposure Time: Please see nurses notes. Contrast: None used. Fluoroscopic Guidance: I was personally present during the use of fluoroscopy. "Tunnel Vision Technique" used to obtain the best possible view of the target area. Parallax error corrected before commencing the procedure. "Direction-depth-direction" technique used to introduce the needle under continuous pulsed fluoroscopy. Once target was reached, antero-posterior, oblique, and lateral fluoroscopic projection used confirm needle placement in all planes. Images permanently stored in EMR. Interpretation: No contrast injected. I personally interpreted the imaging  intraoperatively. Adequate needle placement confirmed in multiple planes. Permanent images saved into the patient's record.  Antibiotic Prophylaxis:   Anti-infectives (From admission, onward)    None      Indication(s): None identified  Post-operative Assessment:  Post-procedure Vital Signs:  Pulse/HCG Rate: 9288 Temp: (!) 97 F (36.1 C) Resp: 16 BP: 100/73 SpO2: 98 %  EBL: None  Complications: No immediate post-treatment complications observed by team, or  reported by patient.  Note: The patient tolerated the entire procedure well. A repeat set of vitals were taken after the procedure and the patient was kept under observation following institutional policy, for this type of procedure. Post-procedural neurological assessment was performed, showing return to baseline, prior to discharge. The patient was provided with post-procedure discharge instructions, including a section on how to identify potential problems. Should any problems arise concerning this procedure, the patient was given instructions to immediately contact us, at any time, without hesitation. In any case, we plan to contact the patient by telephone for a follow-up status report regarding this interventional procedure.  Comments:  No additional relevant information.  Plan of Care  Orders:  Orders Placed This Encounter  Procedures   LUMBAR FACET(MEDIAL BRANCH NERVE BLOCK) MBNB    Scheduling Instructions:     Procedure: Lumbar facet block (AKA.: Lumbosacral medial branch nerve block)     Side: Bilateral     Level: L3-4, L4-5, & L5-S1 Facets (L2, L3, L4, L5, & S1 Medial Branch Nerves)     Sedation: Patient's choice.     Timeframe: Today    Order Specific Question:   Where will this procedure be performed?    Answer:   [facility]   DG PAIN [facility] C-ARM 1-60 MIN NO REPORT    Intraoperative interpretation by procedural physician at [place].    Standing Status:   Standing    Number of  Occurrences:   1    Order Specific Question:   Reason for exam:    Answer:   Assistance in needle guidance and placement for procedures requiring needle placement in or near specific anatomical locations not easily accessible without such assistance.   Informed Consent Details: Physician/Practitioner Attestation; Transcribe to consent form and obtain patient signature    Nursing Order: Transcribe to consent form and obtain patient signature. Note: Always confirm laterality of pain with Ms. Brigitte Pulse, before procedure.    Order Specific Question:   Physician/Practitioner attestation of informed consent for procedure/surgical case    Answer:   I, the physician/practitioner, attest that I have discussed with the patient the benefits, risks, side effects, alternatives, likelihood of achieving goals and potential problems during recovery for the procedure that I have provided informed consent.    Order Specific Question:   Procedure    Answer:   Lumbar Facet Block  under fluoroscopic guidance    Order Specific Question:   Physician/Practitioner performing the procedure    Answer:   Loki Wuthrich A. [name] MD    Order Specific Question:   Indication/Reason    Answer:   Low Back Pain, with our without leg pain, due to Facet Joint Arthralgia (Joint Pain) Spondylosis (Arthritis of the Spine), without myelopathy or radiculopathy (Nerve Damage).   Provide equipment / supplies at bedside    "Block Tray" (Disposable  single use) Needle type: SpinalSpinal Amount/quantity: 4 Size: Long (7-inch) Gauge: 22G    Standing Status:   Standing    Number of Occurrences:   1    Order Specific Question:   Specify    Answer:   Block Tray   Miscellanous precautions    NOTE: Although It is true that patients can have allergies to shellfish and that shellfish contain iodine, most shellfish  allergies are due to two protein allergens present in the shellfish: tropomyosins and parvalbumin. Not all patients with shellfish  allergies are allergic to iodine. However, as a precaution, avoid using iodine containing products.  Standing Status:   Standing    Number of Occurrences:   1    Chronic Opioid Analgesic:  Oxycodone IR 5 mg 1 tab PO 5X/day (#150/mo) (25 mg/day) MME/day: 37.5 mg/day.   Medications ordered for procedure: Meds ordered this encounter  Medications   lidocaine (XYLOCAINE) 2 % (with pres) injection 400 mg   lactated ringers infusion 1,000 mL   midazolam (VERSED) 5 MG/5ML injection 1-2 mg    Make sure Flumazenil is available in the pyxis when using this medication. If oversedation occurs, administer 0.2 mg IV over 15 sec. If after 45 sec no response, administer 0.2 mg again over 1 min; may repeat at 1 min intervals; not to exceed 4 doses (1 mg)   ropivacaine (PF) 2 mg/mL (0.2%) (NAROPIN) injection 18 mL   triamcinolone acetonide (KENALOG-40) injection 80 mg    Medications administered: We administered lidocaine, lactated ringers, midazolam, ropivacaine (PF) 2 mg/mL (0.2%), and triamcinolone acetonide.  See the medical record for exact dosing, route, and time of administration.  Follow-up plan:   Return in about 2 weeks (around 04/28/2021) for (Vv-PPE) PM Proc-day (T,Th).      Interventional Therapies  Risk  Complexity Considerations:   Estimated body mass index is 62.48 kg/m as calculated from the following:   Height as of this encounter: '5\' 4"'$  (1.626 m).   Weight as of this encounter: 364 lb (165.1 kg). NOTE: NO RFA until BMI less or equal to 35.  (Gastric bypass done on 06/03/2017) Iodine allergy  Contrast dye allergy  Shellfish allergy    Planned  Pending:   Palliative bilateral IA steroid knee injection #1  Palliative bilateral lumbar facet block #5    Under consideration:   Diagnostic bilateral genicular nerve block    Completed:   Therapeutic bilateral IA Hyalgan knee injection x16 (06/24/2020)  Therapeutic bilateral THR (total hip replacements) (Dr. Rudene Christians) (Right:  11/27/2019) (Left: 07/29/2015)  Therapeutic bilateral lumbar facet MBB x4 (03/07/2018)  Therapeutic right IA hip injection x3 (07/07/2017)  Therapeutic left IA shoulder (glenohumeral joint) injection x1 (05/31/2017)    Therapeutic  Palliative (PRN) options:   Palliative bilateral IA Hyalgan knee injections  Palliative bilateral lumbar facet blocks      Recent Visits Date Type Provider Dept  03/04/21 Telemedicine Milinda Pointer, MD [facility]  02/19/21 Procedure visit Milinda Pointer, MD [facility]  01/21/21 Office Visit Milinda Pointer, MD [facility]  Showing recent visits within past 90 days and meeting all other requirements Today's Visits Date Type Provider Dept  04/14/21 Procedure visit Milinda Pointer, MD [facility]  Showing today's visits and meeting all other requirements Future Appointments Date Type Provider Dept  04/22/21 Appointment Milinda Pointer, MD [facility]  05/05/21 Appointment Milinda Pointer, MD [facility]  Showing future appointments within next 90 days and meeting all other requirements Disposition: Discharge home  Discharge (Date  Time): 04/14/2021; 1218 hrs.   Primary Care Physician: Idelle Crouch, MD Location: [facility] Note by: Gaspar Cola, MD Date: 04/14/2021; Time: 12:36 PM  Disclaimer:  Medicine is not an Chief Strategy Officer. The only guarantee in medicine is that nothing is guaranteed. It is important to note that the decision to proceed with this intervention was based on the information collected from the patient. The Data and conclusions were drawn from the patient's questionnaire, the interview, and the physical examination. Because the information was provided in large part by the patient, it cannot be guaranteed  that it has not been purposely or unconsciously manipulated. Every effort has been made to obtain as much relevant data as  possible for this evaluation. It is important to note that the conclusions that lead to this procedure are derived in large part from the available data. Always take into account that the treatment will also be dependent on availability of resources and existing treatment guidelines, considered by other Pain Management Practitioners as being common knowledge and practice, at the time of the intervention. For Medico-Legal purposes, it is also important to point out that variation in procedural techniques and pharmacological choices are the acceptable norm. The indications, contraindications, technique, and results of the above procedure should only be interpreted and judged by a Board-Certified Interventional Pain Specialist with extensive familiarity and expertise in the same exact procedure and technique.

## 2021-04-14 ENCOUNTER — Ambulatory Visit (HOSPITAL_BASED_OUTPATIENT_CLINIC_OR_DEPARTMENT_OTHER): Payer: Medicare HMO | Admitting: Pain Medicine

## 2021-04-14 ENCOUNTER — Encounter: Payer: Self-pay | Admitting: Pain Medicine

## 2021-04-14 ENCOUNTER — Other Ambulatory Visit: Payer: Self-pay

## 2021-04-14 ENCOUNTER — Ambulatory Visit
Admission: RE | Admit: 2021-04-14 | Discharge: 2021-04-14 | Disposition: A | Discharge status: Home / Self Care | Payer: Medicare HMO | Source: Ambulatory Visit | Attending: Pain Medicine | Admitting: Pain Medicine

## 2021-04-14 VITALS — BP 100/73 | HR 92 | Temp 97.0°F | Resp 16 | Ht 64.0 in | Wt 349.0 lb

## 2021-04-14 DIAGNOSIS — M47816 Spondylosis without myelopathy or radiculopathy, lumbar region: Secondary | ICD-10-CM | POA: Diagnosis not present

## 2021-04-14 DIAGNOSIS — M47817 Spondylosis without myelopathy or radiculopathy, lumbosacral region: Secondary | ICD-10-CM | POA: Insufficient documentation

## 2021-04-14 DIAGNOSIS — Z6841 Body Mass Index (BMI) 40.0 and over, adult: Secondary | ICD-10-CM

## 2021-04-14 DIAGNOSIS — M545 Low back pain, unspecified: Secondary | ICD-10-CM | POA: Diagnosis not present

## 2021-04-14 DIAGNOSIS — Z888 Allergy status to other drugs, medicaments and biological substances status: Secondary | ICD-10-CM | POA: Insufficient documentation

## 2021-04-14 DIAGNOSIS — M5137 Other intervertebral disc degeneration, lumbosacral region: Secondary | ICD-10-CM | POA: Insufficient documentation

## 2021-04-14 DIAGNOSIS — M4310 Spondylolisthesis, site unspecified: Secondary | ICD-10-CM

## 2021-04-14 DIAGNOSIS — M153 Secondary multiple arthritis: Secondary | ICD-10-CM | POA: Insufficient documentation

## 2021-04-14 DIAGNOSIS — G8929 Other chronic pain: Secondary | ICD-10-CM | POA: Insufficient documentation

## 2021-04-14 MED ORDER — TRIAMCINOLONE ACETONIDE 40 MG/ML IJ SUSP
80.0000 mg | Freq: Once | INTRAMUSCULAR | Status: AC
  Administered 2021-04-14: 80 mg

## 2021-04-14 MED ORDER — ROPIVACAINE HCL 2 MG/ML IJ SOLN
18.0000 mL | Freq: Once | INTRAMUSCULAR | Status: AC
  Administered 2021-04-14: 18 mL via PERINEURAL

## 2021-04-14 MED ORDER — TRIAMCINOLONE ACETONIDE 40 MG/ML IJ SUSP
INTRAMUSCULAR | Status: AC
  Filled 2021-04-14: qty 2

## 2021-04-14 MED ORDER — LIDOCAINE HCL (PF) 2 % IJ SOLN
INTRAMUSCULAR | Status: AC
  Filled 2021-04-14: qty 20

## 2021-04-14 MED ORDER — MIDAZOLAM HCL 5 MG/5ML IJ SOLN
1.0000 mg | INTRAMUSCULAR | Status: DC | PRN
  Administered 2021-04-14: 2 mg via INTRAVENOUS

## 2021-04-14 MED ORDER — LIDOCAINE HCL 2 % IJ SOLN
20.0000 mL | Freq: Once | INTRAMUSCULAR | Status: AC
  Administered 2021-04-14: 400 mg

## 2021-04-14 MED ORDER — MIDAZOLAM HCL 5 MG/5ML IJ SOLN
INTRAMUSCULAR | Status: AC
  Filled 2021-04-14: qty 5

## 2021-04-14 MED ORDER — LACTATED RINGERS IV SOLN
1000.0000 mL | Freq: Once | INTRAVENOUS | Status: AC
  Administered 2021-04-14: 1000 mL via INTRAVENOUS

## 2021-04-14 MED ORDER — ROPIVACAINE HCL 2 MG/ML IJ SOLN
INTRAMUSCULAR | Status: AC
  Filled 2021-04-14: qty 20

## 2021-04-14 NOTE — Progress Notes (Signed)
Safety precautions to be maintained throughout the outpatient stay will include: orient to surroundings, keep bed in low position, maintain call bell within reach at all times, provide assistance with transfer out of bed and ambulation.  

## 2021-04-14 NOTE — Patient Instructions (Signed)

## 2021-04-15 ENCOUNTER — Telehealth: Payer: Self-pay

## 2021-04-15 NOTE — Telephone Encounter (Signed)
Procedure phone call.  Patient sates she is doing good.

## 2021-04-17 ENCOUNTER — Other Ambulatory Visit: Payer: Self-pay

## 2021-04-17 DIAGNOSIS — D509 Iron deficiency anemia, unspecified: Secondary | ICD-10-CM

## 2021-04-22 ENCOUNTER — Encounter: Payer: Self-pay | Admitting: Pain Medicine

## 2021-04-22 ENCOUNTER — Other Ambulatory Visit: Payer: Self-pay

## 2021-04-22 ENCOUNTER — Ambulatory Visit: Payer: Medicare HMO | Attending: Pain Medicine | Admitting: Pain Medicine

## 2021-04-22 ENCOUNTER — Inpatient Hospital Stay: Payer: Medicare HMO | Attending: Oncology

## 2021-04-22 VITALS — BP 136/86 | HR 88 | Temp 96.7°F | Ht 64.0 in | Wt 346.0 lb

## 2021-04-22 DIAGNOSIS — G894 Chronic pain syndrome: Secondary | ICD-10-CM | POA: Diagnosis not present

## 2021-04-22 DIAGNOSIS — Z9884 Bariatric surgery status: Secondary | ICD-10-CM | POA: Insufficient documentation

## 2021-04-22 DIAGNOSIS — Z79891 Long term (current) use of opiate analgesic: Secondary | ICD-10-CM | POA: Insufficient documentation

## 2021-04-22 DIAGNOSIS — D509 Iron deficiency anemia, unspecified: Secondary | ICD-10-CM

## 2021-04-22 DIAGNOSIS — K912 Postsurgical malabsorption, not elsewhere classified: Secondary | ICD-10-CM | POA: Diagnosis not present

## 2021-04-22 DIAGNOSIS — D508 Other iron deficiency anemias: Secondary | ICD-10-CM | POA: Insufficient documentation

## 2021-04-22 DIAGNOSIS — E039 Hypothyroidism, unspecified: Secondary | ICD-10-CM | POA: Diagnosis not present

## 2021-04-22 DIAGNOSIS — Z79899 Other long term (current) drug therapy: Secondary | ICD-10-CM | POA: Insufficient documentation

## 2021-04-22 DIAGNOSIS — D72829 Elevated white blood cell count, unspecified: Secondary | ICD-10-CM | POA: Diagnosis not present

## 2021-04-22 DIAGNOSIS — E538 Deficiency of other specified B group vitamins: Secondary | ICD-10-CM | POA: Diagnosis not present

## 2021-04-22 DIAGNOSIS — G8929 Other chronic pain: Secondary | ICD-10-CM | POA: Diagnosis not present

## 2021-04-22 DIAGNOSIS — M25561 Pain in right knee: Secondary | ICD-10-CM | POA: Insufficient documentation

## 2021-04-22 DIAGNOSIS — L659 Nonscarring hair loss, unspecified: Secondary | ICD-10-CM | POA: Diagnosis not present

## 2021-04-22 DIAGNOSIS — M25551 Pain in right hip: Secondary | ICD-10-CM | POA: Diagnosis not present

## 2021-04-22 DIAGNOSIS — M25562 Pain in left knee: Secondary | ICD-10-CM | POA: Diagnosis not present

## 2021-04-22 DIAGNOSIS — M545 Low back pain, unspecified: Secondary | ICD-10-CM | POA: Diagnosis not present

## 2021-04-22 LAB — COMPREHENSIVE METABOLIC PANEL
ALT: 34 U/L (ref 0–44)
AST: 34 U/L (ref 15–41)
Albumin: 4.1 g/dL (ref 3.5–5.0)
Alkaline Phosphatase: 63 U/L (ref 38–126)
Anion gap: 11 (ref 5–15)
BUN: 32 mg/dL — ABNORMAL HIGH (ref 6–20)
CO2: 22 mmol/L (ref 22–32)
Calcium: 8.9 mg/dL (ref 8.9–10.3)
Chloride: 99 mmol/L (ref 98–111)
Creatinine, Ser: 1.2 mg/dL — ABNORMAL HIGH (ref 0.44–1.00)
GFR, Estimated: 55 mL/min — ABNORMAL LOW (ref >60)
Glucose, Bld: 144 mg/dL — ABNORMAL HIGH (ref 70–99)
Potassium: 3.8 mmol/L (ref 3.5–5.1)
Sodium: 132 mmol/L — ABNORMAL LOW (ref 135–145)
Total Bilirubin: 0.6 mg/dL (ref 0.3–1.2)
Total Protein: 7.4 g/dL (ref 6.5–8.1)

## 2021-04-22 LAB — IRON AND TIBC
Iron: 84 ug/dL (ref 28–170)
Saturation Ratios: 16 % (ref 10.4–31.8)
TIBC: 511 ug/dL — ABNORMAL HIGH (ref 250–450)
UIBC: 427 ug/dL

## 2021-04-22 LAB — CBC WITH DIFFERENTIAL/PLATELET
Abs Immature Granulocytes: 0.04 10*3/uL (ref 0.00–0.07)
Basophils Absolute: 0 10*3/uL (ref 0.0–0.1)
Basophils Relative: 0 %
Eosinophils Absolute: 0.1 10*3/uL (ref 0.0–0.5)
Eosinophils Relative: 1 %
HCT: 41.4 % (ref 36.0–46.0)
Hemoglobin: 13.5 g/dL (ref 12.0–15.0)
Immature Granulocytes: 0 %
Lymphocytes Relative: 20 %
Lymphs Abs: 2.7 10*3/uL (ref 0.7–4.0)
MCH: 27.8 pg (ref 26.0–34.0)
MCHC: 32.6 g/dL (ref 30.0–36.0)
MCV: 85.2 fL (ref 80.0–100.0)
Monocytes Absolute: 0.5 10*3/uL (ref 0.1–1.0)
Monocytes Relative: 4 %
Neutro Abs: 10.1 10*3/uL — ABNORMAL HIGH (ref 1.7–7.7)
Neutrophils Relative %: 75 %
Platelets: 326 10*3/uL (ref 150–400)
RBC: 4.86 MIL/uL (ref 3.87–5.11)
RDW: 13.8 % (ref 11.5–15.5)
WBC: 13.5 10*3/uL — ABNORMAL HIGH (ref 4.0–10.5)
nRBC: 0 % (ref 0.0–0.2)

## 2021-04-22 LAB — VITAMIN B12: Vitamin B-12: 1998 pg/mL — ABNORMAL HIGH (ref 180–914)

## 2021-04-22 LAB — FERRITIN: Ferritin: 28 ng/mL (ref 11–307)

## 2021-04-22 MED ORDER — OXYCODONE HCL 5 MG PO TABS: 5.0000 mg | ORAL_TABLET | Freq: Every day | ORAL | 0 refills | Status: DC

## 2021-04-22 NOTE — Progress Notes (Signed)
Nursing Pain Medication Assessment:  Safety precautions to be maintained throughout the outpatient stay will include: orient to surroundings, keep bed in low position, maintain call bell within reach at all times, provide assistance with transfer out of bed and ambulation.  Medication Inspection Compliance: Pill count conducted under aseptic conditions, in front of the patient. Neither the pills nor the bottle was removed from the patient's sight at any time. Once count was completed pills were immediately returned to the patient in their original bottle.   Medication #1: Oxycodone IR Pill/Patch Count: 171 out of 300 Pill/Patch Appearance: Markings consistent with prescribed medication Bottle Appearance: Standard pharmacy container. Clearly labeled. Filled Date: 8/ 9/ 2022 Last Medication intake:  today

## 2021-04-22 NOTE — Progress Notes (Signed)
PROVIDER NOTE: Information contained herein reflects review and annotations entered in association with encounter. Interpretation of such information and data should be left to medically-trained personnel. Information provided to patient can be located elsewhere in the medical record under "Patient Instructions". Document created using STT-dictation technology, any transcriptional errors that may result from process are unintentional.    Patient: Karen Lewis  Service Category: E/M  Provider: Gaspar Cola, MD  DOB: Sep 12, 1971  DOS: 04/22/2021  Specialty: Interventional Pain Management  MRN: [id number]  Setting: Ambulatory outpatient  PCP: Idelle Crouch, MD  Type: Established Patient    Referring Provider: Idelle Crouch, MD  Location: Office  Delivery: Face-to-face     HPI  [name], a 50 y.o. year old female, is here today because of her Chronic pain syndrome [G89.4]. Ms. Mayfield primary complain today is No chief complaint on file. Last encounter: My last encounter with her was on 04/14/2021. Pertinent problems: Ms. Chirico has Fibromyalgia; Chronic knee pain (1ry area of Pain) (Bilateral) (R>L); Chronic low back pain (3ry area of Pain) (Bilateral) (R>L) w/o sciatica; Lumbar facet syndrome (Bilateral) (R>L); Secondary osteoarthritis of multiple sites; Grade 1 (1.4 cm) Anterolisthesis of L4 over L5; Chronic hip pain (2ry area of Pain) (Right); S/P THR (total hip replacement) (Left); Lumbar spondylosis; Chronic pain syndrome; Neurogenic pain; Upper extremity pain; Chronic shoulder pain (Left); Osteoarthritis of shoulder (Left); Osteoarthritis of hip (Right); Secondary Osteoarthritis of knee (Bilateral) (R>L); Spondylosis without myelopathy or radiculopathy, lumbosacral region; Panniculitis; Leg swelling; Edema of both legs; History of total hip replacement (Bilateral); S/P THR (total hip replacement) (Right) (11/27/2019); Pain and numbness of left upper extremity; Cervical radiculitis (Left);  and DDD (degenerative disc disease), lumbosacral on their pertinent problem list. Pain Assessment: Severity of Chronic pain is reported as a 4 /10. Location: Back  /pain radiaities down both leg to her calf. Onset: 1 to 4 weeks ago. Quality: Aching, Constant, Sharp. Timing: Constant. Modifying factor(s): heating pad, meds and repositioning. Vitals:  height is '5\' 4"'  (1.626 m) and weight is 346 lb (156.9 kg) (abnormal). Her temperature is 96.7 F (35.9 C) (abnormal). Her blood pressure is 136/86 and her pulse is 88. Her oxygen saturation is 97%.   Reason for encounter: both, medication management and post-procedure assessment.   The patient indicates doing well with the current medication regimen. No adverse reactions or side effects reported to the medications.   Postprocedure evaluation reveals that the patient has attained an ongoing 100% relief of her low back pain by having blocked the lumbar facets, bilaterally.  This would confirm that the pain in this area was in fact coming from those facet joints and that it had an inflammatory component to it.  She is currently doing well and for the time being she does not need anything else.  RTCB: 08/24/2021 Nonopioid transferred 06/24/2020: Lyrica  Post-Procedure Evaluation  Procedure (04/14/2021):  Type: Lumbar Facet, Medial Branch Block(s)          Primary Purpose: Diagnostic Region: Posterolateral Lumbosacral Spine Level: L2, L3, L4, L5, & S1 Medial Branch Level(s). Injecting these levels blocks the L3-4, L4-5, and L5-S1 lumbar facet joints. Laterality: Bilateral  Pre-procedure pain level: 4/10 Post-procedure: 0/10 (100% relief)  Anxiolysis: Anxiolysis provided  Effectiveness during initial hour after procedure (Ultra-Short Term Relief): 100 %.  Local anesthetic used: Long-acting (4-6 hours) Effectiveness: Defined as any analgesic benefit obtained secondary to the administration of local anesthetics. This carries significant diagnostic value  as to the etiological  location, or anatomical origin, of the pain. Duration of benefit is expected to coincide with the duration of the local anesthetic used.  Effectiveness during initial 4-6 hours after procedure (Short-Term Relief): 100 %.  Long-term benefit: Defined as any relief past the pharmacologic duration of the local anesthetics.  Effectiveness past the initial 6 hours after procedure (Long-Term Relief): 100 %.  Benefits, current: Defined as benefit present at the time of this evaluation.   Analgesia:   The patient indicates currently enjoying a 100% ongoing relief of her low back pain. Function: Ms. Loose reports improvement in function ROM: Ms. Kneece reports improvement in ROM  Pharmacotherapy Assessment  Analgesic: Oxycodone IR 5 mg 1 tab PO 5X/day (#150/mo) (25 mg/day) MME/day: 37.5 mg/day.   Monitoring: [place] PMP: PDMP reviewed during this encounter.       Pharmacotherapy: No side-effects or adverse reactions reported. Compliance: No problems identified. Effectiveness: Clinically acceptable.  Chauncey Fischer, RN  04/22/2021  3:42 PM  Sign when Signing Visit Nursing Pain Medication Assessment:  Safety precautions to be maintained throughout the outpatient stay will include: orient to surroundings, keep bed in low position, maintain call bell within reach at all times, provide assistance with transfer out of bed and ambulation.  Medication Inspection Compliance: Pill count conducted under aseptic conditions, in front of the patient. Neither the pills nor the bottle was removed from the patient's sight at any time. Once count was completed pills were immediately returned to the patient in their original bottle.   Medication #1: Oxycodone IR Pill/Patch Count: 171 out of 300 Pill/Patch Appearance: Markings consistent with prescribed medication Bottle Appearance: Standard pharmacy container. Clearly labeled. Filled Date: 8/ 9/ 2022 Last Medication intake:  today         UDS:   Summary  Date Value Ref Range Status  02/19/2020 Note  Final    Comment:    ==================================================================== [employer] (MW) ==================================================================== Test                             Result       Flag       Units  Drug Present and Declared for Prescription Verification   7-aminoclonazepam              44           EXPECTED   ng/mg creat    7-aminoclonazepam is an expected metabolite of clonazepam. Source of    clonazepam is a scheduled prescription medication.  Drug Present not Declared for Prescription Verification   Oxycodone                      199          UNEXPECTED ng/mg creat   Oxymorphone                    127          UNEXPECTED ng/mg creat   Noroxycodone                   1026         UNEXPECTED ng/mg creat   Noroxymorphone                 56           UNEXPECTED ng/mg creat    Sources of oxycodone are scheduled prescription medications.    Oxymorphone, noroxycodone, and noroxymorphone are expected  metabolites of oxycodone. Oxymorphone is also available as a    scheduled prescription medication.  Drug Absent but Declared for Prescription Verification   Butalbital                     Not Detected UNEXPECTED ==================================================================== Test                      Result    Flag   Units      Ref Range   Creatinine              165              mg/dL      >=20 ==================================================================== Declared Medications:  The flagging and interpretation on this report are based on the  following declared medications.  Unexpected results may arise from  inaccuracies in the declared medications.   **Note: The testing scope of this panel includes these medications:   Butalbital (Fioricet)  Clonazepam (Klonopin)   **Note: The testing scope of this panel does not include the  following reported medications:    Acetaminophen (Tylenol)  Acetaminophen (Fioricet)  Albuterol (Ventolin HFA)  Amitriptyline (Elavil)  Biotin  Caffeine (Fioricet)  Calcium (Tums)  Diphenhydramine (Benadryl)  Epinephrine (EpiPen)  Eye Drops  Famotidine (Pepcid)  Levocetirizine (Xyzal)  Levothyroxine (Synthroid)  Lisinopril (Zestril)  Magnesium (Mag-Ox)  Meclizine (Antivert)  Medroxyprogesterone (Depo-Provera)  Metformin (Glucophage)  Metoprolol (Lopressor)  Montelukast (Singulair)  Multivitamin  Naloxone (Narcan)  Pregabalin (Lyrica)  Probiotic  Quetiapine (Seroquel)  Rosuvastatin (Crestor)  Sertraline (Zoloft)  Spironolactone (Aldactone)  Tizanidine (Zanaflex)  Topical  Topical Diclofenac  Triamcinolone (Nasacort)  Vitamin B  Vitamin B12  Vitamin C  Vitamin D2  Zolpidem (Ambien)  Zonisamide (Zonegran) ==================================================================== For clinical consultation, please call [phone]. ====================================================================      ROS  Constitutional: Denies any fever or chills Gastrointestinal: No reported hemesis, hematochezia, vomiting, or acute GI distress Musculoskeletal: Denies any acute onset joint swelling, redness, loss of ROM, or weakness Neurological: No reported episodes of acute onset apraxia, aphasia, dysarthria, agnosia, amnesia, paralysis, loss of coordination, or loss of consciousness  Medication Review  B-12, Biotin, Dupilumab, EPINEPHrine, Galcanezumab-gnlm, QUEtiapine, Semaglutide(0.25 or 0.5MG/DOS), Tab-A-Vite, acetaminophen, acidophilus, albuterol, amitriptyline, butalbital-acetaminophen-caffeine, calcium carbonate, clonazePAM, diclofenac sodium, diphenhydrAMINE, ergocalciferol, famotidine, fluticasone furoate-vilanterol, furosemide, glucose blood, guaiFENesin-codeine, levocetirizine, levothyroxine, lisinopril, magnesium oxide, meclizine, metFORMIN, metoprolol tartrate, montelukast, naloxone,  naphazoline-pheniramine, nitrofurantoin (macrocrystal-monohydrate), omeprazole, oxyCODONE, pregabalin, rizatriptan, rosuvastatin, sertraline, spironolactone, tiZANidine, topiramate, vitamin C, zolpidem, and zonisamide  History Review  Allergy: Ms. Benham is allergic to bactrim [sulfamethoxazole-trimethoprim], omalizumab, ciprofloxacin, shellfish allergy, and nsaids. Drug: Ms. Dresner  reports no history of drug use. Alcohol:  reports no history of alcohol use. Tobacco:  reports that she has never smoked. She has never used smokeless tobacco. Social: Ms. Lamore  reports that she has never smoked. She has never used smokeless tobacco. She reports that she does not drink alcohol and does not use drugs. Medical:  has a past medical history of Anemia, Anginal pain ([place]), Anxiety, Arthralgia of hip (07/29/2015), Arthritis, Arthritis, degenerative (07/29/2015), Asthma, Cephalalgia (07/25/2014), Dependence on unknown drug ([place]), Depression, Diabetes mellitus without complication ([place]), Difficult intubation, Dysrhythmia, Eczema, Fibromyalgia, Gastritis, GERD (gastroesophageal reflux disease), Gonalgia (07/29/2015), Gout, H/O cardiovascular disorder (03/10/2015), H/O surgical procedure (12/05/2012), H/O thyroid disease (03/10/2015), Headache, Herpes, History of artificial joint (07/29/2015), History of hiatal hernia, Hypertension, Hypomagnesemia, Hypothyroidism, IDA (iron deficiency anemia) (05/28/2019), LBP (low back pain) (07/29/2015), Neuromuscular disorder ([place]), Obesity, PCOS (polycystic  ovarian syndrome), Primary osteoarthritis of both knees (07/29/2015), Sleep apnea, Thyroid nodule (bilateral), and Umbilical hernia. Surgical: Ms. Bartko  has a past surgical history that includes Laparoscopic partial gastrectomy; Shoulder arthroscopy (Right); Carpal tunnel release (Bilateral); Diagnostic laparoscopy; Cholecystectomy; Trigger finger release (Right); Thyroidectomy (N/A, 11/12/2015); left trigger finger; Roux-en-Y Gastric Bypass  (06/03/2017); Hiatal hernia repair; peniculectomy (N/A, 07/05/2018); Total hip arthroplasty (Right, 11/27/2019); Joint replacement (Bilateral, hip); Appendectomy; Trigger finger release (Right, 04/24/2020); Colonoscopy with propofol (N/A, 02/11/2021); and Esophagogastroduodenoscopy (egd) with propofol (N/A, 02/11/2021). Family: family history includes Alcohol abuse in her father and mother; Anxiety disorder in her father and mother; Breast cancer in her paternal aunt; COPD in her father; Depression in her brother, father, and mother; Diabetes in her brother, father, and mother; Hypertension in her brother, father, and mother; Kidney cancer in her mother; Kidney failure in her father; Post-traumatic stress disorder in her father; Sleep apnea in her brother, father, and mother.  Laboratory Chemistry Profile   Renal Lab Results  Component Value Date   BUN 32 (H) 04/22/2021   CREATININE 1.20 (H) 04/22/2021   GFRAA >60 04/16/2020   GFRNONAA 55 (L) 04/22/2021    Hepatic Lab Results  Component Value Date   AST 34 04/22/2021   ALT 34 04/22/2021   ALBUMIN 4.1 04/22/2021   ALKPHOS 63 04/22/2021    Electrolytes Lab Results  Component Value Date   NA 132 (L) 04/22/2021   K 3.8 04/22/2021   CL 99 04/22/2021   CALCIUM 8.9 04/22/2021   MG 1.7 12/11/2019   PHOS 5.5 (H) 05/22/2019    Bone Lab Results  Component Value Date   VD25OH 27.4 (L) 11/24/2015   VD125OH2TOT 41.5 11/24/2015    Inflammation (CRP: Acute Phase) (ESR: Chronic Phase) Lab Results  Component Value Date   CRP 0.7 09/27/2019   ESRSEDRATE 30 (H) 09/27/2019         Note: Above Lab results reviewed.  Recent Imaging Review  DG PAIN CLINIC C-ARM 1-60 MIN NO REPORT Fluoro was used, but no Radiologist interpretation will be provided.  Please refer to "NOTES" tab for provider progress note. Note: Reviewed        Physical Exam  General appearance: Well nourished, well developed, and well hydrated. In no apparent acute  distress Mental status: Alert, oriented x 3 (person, place, & time)       Respiratory: No evidence of acute respiratory distress Eyes: PERLA Vitals: BP 136/86   Pulse 88   Temp (!) 96.7 F (35.9 C)   Ht '5\' 4"'  (1.626 m)   Wt (!) 346 lb (156.9 kg)   SpO2 97%   BMI 59.39 kg/m  BMI: Estimated body mass index is 59.39 kg/m as calculated from the following:   Height as of this encounter: '5\' 4"'  (1.626 m).   Weight as of this encounter: 346 lb (156.9 kg). Ideal: Ideal body weight: 54.7 kg (120 lb 9.5 oz) Adjusted ideal body weight: 95.6 kg (210 lb 12.1 oz)  Assessment   Status Diagnosis  Controlled Controlled Controlled 1. Chronic pain syndrome   2. Pharmacologic therapy   3. Chronic use of opiate for therapeutic purpose   4. Encounter for medication management   5. Chronic low back pain (3ry area of Pain) (Bilateral) (R>L) w/o sciatica   6. Chronic hip pain (2ry area of Pain) (Right)   7. Chronic knee pain (1ry area of Pain) (Bilateral) (R>L)      Updated Problems: No problems updated.  Plan of Care  Problem-specific:  No problem-specific Assessment & Plan notes found for this encounter.  Ms. RAJAH LAMBA has a current medication list which includes the following long-term medication(s): albuterol, clonazepam, diphenhydramine, furosemide, magnesium oxide, metformin, metoprolol tartrate, montelukast, naloxone, [START ON 05/26/2021] oxycodone, [START ON 06/25/2021] oxycodone, [START ON 07/25/2021] oxycodone, rizatriptan, rizatriptan, spironolactone, zonisamide, levothyroxine, lisinopril, and pregabalin.  Pharmacotherapy (Medications Ordered): Meds ordered this encounter  Medications   oxyCODONE (OXY IR/ROXICODONE) 5 MG immediate release tablet    Sig: Take 1 tablet (5 mg total) by mouth 5 (five) times daily. Must last 30 days    Dispense:  150 tablet    Refill:  0    Not a duplicate. Do NOT delete! Dispense 1 day early if closed on refill date. Avoid benzodiazepines within 8  hours of opioids. Do not send refill requests.   oxyCODONE (OXY IR/ROXICODONE) 5 MG immediate release tablet    Sig: Take 1 tablet (5 mg total) by mouth 5 (five) times daily. Must last 30 days    Dispense:  150 tablet    Refill:  0    Not a duplicate. Do NOT delete! Dispense 1 day early if closed on refill date. Avoid benzodiazepines within 8 hours of opioids. Do not send refill requests.   oxyCODONE (OXY IR/ROXICODONE) 5 MG immediate release tablet    Sig: Take 1 tablet (5 mg total) by mouth 5 (five) times daily. Must last 30 days    Dispense:  150 tablet    Refill:  0    Not a duplicate. Do NOT delete! Dispense 1 day early if closed on refill date. Avoid benzodiazepines within 8 hours of opioids. Do not send refill requests.   Orders:  No orders of the defined types were placed in this encounter.  Follow-up plan:   Return in about 4 months (around 08/24/2021) for (M,W) (F2F) (MM).     Interventional Therapies  Risk  Complexity Considerations:   Estimated body mass index is 62.48 kg/m as calculated from the following:   Height as of this encounter: '5\' 4"'  (1.626 m).   Weight as of this encounter: 364 lb (165.1 kg). NOTE: NO RFA until BMI less or equal to 35.  (Gastric bypass done on 06/03/2017) Iodine allergy  Contrast dye allergy  Shellfish allergy    Planned  Pending:   Palliative bilateral IA steroid knee injection #1  Palliative bilateral lumbar facet block #5    Under consideration:   Diagnostic bilateral genicular nerve block    Completed:   Therapeutic bilateral IA Hyalgan knee injection x16 (06/24/2020)  Therapeutic bilateral THR (total hip replacements) (Dr. Rudene Christians) (Right: 11/27/2019) (Left: 07/29/2015)  Therapeutic bilateral lumbar facet MBB x4 (03/07/2018)  Therapeutic right IA hip injection x3 (07/07/2017)  Therapeutic left IA shoulder (glenohumeral joint) injection x1 (05/31/2017)    Therapeutic  Palliative (PRN) options:   Palliative bilateral IA Hyalgan knee  injections  Palliative bilateral lumbar facet blocks       Recent Visits Date Type Provider Dept  04/14/21 Procedure visit Milinda Pointer, MD [facility]  03/04/21 Telemedicine Milinda Pointer, MD [facility]  02/19/21 Procedure visit Milinda Pointer, MD [facility]  Showing recent visits within past 90 days and meeting all other requirements Today's Visits Date Type Provider Dept  04/22/21 Office Visit Milinda Pointer, MD [facility]  Showing today's visits and meeting all other requirements Future Appointments Date Type Provider Dept  05/05/21 Appointment Milinda Pointer, MD [facility]  Showing future appointments within next  90 days and meeting all other requirements I discussed the assessment and treatment plan with the patient. The patient was provided an opportunity to ask questions and all were answered. The patient agreed with the plan and demonstrated an understanding of the instructions.  Patient advised to call back or seek an in-person evaluation if the symptoms or condition worsens.  Duration of encounter: 30 minutes.  Note by: Gaspar Cola, MD Date: 04/22/2021; Time: 6:09 PM

## 2021-04-24 ENCOUNTER — Other Ambulatory Visit: Payer: Self-pay

## 2021-04-24 ENCOUNTER — Inpatient Hospital Stay (HOSPITAL_BASED_OUTPATIENT_CLINIC_OR_DEPARTMENT_OTHER): Payer: Medicare HMO | Admitting: Oncology

## 2021-04-24 ENCOUNTER — Inpatient Hospital Stay: Payer: Medicare HMO | Admitting: Oncology

## 2021-04-24 DIAGNOSIS — D509 Iron deficiency anemia, unspecified: Secondary | ICD-10-CM

## 2021-04-24 DIAGNOSIS — Z9884 Bariatric surgery status: Secondary | ICD-10-CM

## 2021-04-24 NOTE — Progress Notes (Addendum)
Hematology/Oncology follow up note [facility] [phone] [phone]   Patient Care Team: Idelle Crouch, MD as PCP - General (Internal Medicine) Jon Billings, RN as [place] Management  REFERRING PROVIDER: Idelle Crouch, MD CHIEF COMPLAINTS/REASON FOR VISIT:  Follow up  iron deficiency anemia  I connected with Althea Charon on 06/24/21 at  2:45 PM EDT by telephone visit and verified that I am speaking with the correct person using two identifiers.   I discussed the limitations, risks, security and privacy concerns of performing an evaluation and management service by telemedicine and the availability of in-person appointments. I also discussed with the patient that there may be a patient responsible charge related to this service. The patient expressed understanding and agreed to proceed.   Other persons participating in the visit and their role in the encounter: NP, Patient    Patient's location: Home  Provider's location: Clinic    HISTORY OF PRESENTING ILLNESS:  Mrs. Sorce is a 50 year old female with past medical history significant for headaches migraines, hypertension, asthma, sleep apnea, GERD, Barrett's esophagus, obesity, type 2 diabetes, hypothyroidism, fibromyalgia who is followed by Dr. Tasia Catchings for iron deficiency anemia secondary to gastric bypass surgery and malabsorption.  She appears to receive IV iron annually.  She last received IV Venofer on 04/22/2020.  INTERVAL HISTORY In the interim, she has suffered from extreme hair loss and more recently has had several teeth pulled due to dry mouth from medications she is taking.  Reports having at least 3 teeth pulled over the past 2 months.  She was instructed to decrease her Synthroid dose due to a TSH of 0.021 to see if this would help with hair loss.  Reports slowly recovering from COVID infection that she had several months back.  She continues to have  shortness of breath and cough with some sputum production.  Unsure if some of her new symptoms are stemming from the COVID infection.  Reports feeling tired over the past couple months and thinks that her iron levels are declining.  Review of Systems  Constitutional:  Positive for fatigue. Negative for appetite change, fever and unexpected weight change.  HENT:   Negative for nosebleeds, sore throat and trouble swallowing.        Several tooth extractions recently   Eyes: Negative.   Respiratory:  Positive for cough and shortness of breath. Negative for wheezing.   Cardiovascular: Negative.  Negative for chest pain and leg swelling.  Gastrointestinal:  Negative for abdominal pain, blood in stool, constipation, diarrhea, nausea and vomiting.  Endocrine: Negative.   Genitourinary: Negative.  Negative for bladder incontinence, hematuria and nocturia.   Musculoskeletal: Negative.  Negative for back pain and flank pain.  Skin: Negative.   Neurological: Negative.  Negative for dizziness, headaches, light-headedness and numbness.  Hematological: Negative.   Psychiatric/Behavioral: Negative.  Negative for confusion. The patient is not nervous/anxious.    MEDICAL HISTORY:  Past Medical History:  Diagnosis Date   Anemia    Anginal pain (HCC)    Anxiety    Arthralgia of hip 07/29/2015   Arthritis    Arthritis, degenerative 07/29/2015   Asthma    Cephalalgia 07/25/2014   Dependence on unknown drug ([place])    multiplt controlled drug dependence   Depression    Diabetes mellitus without complication ([place])    Difficult intubation    per pt needs small tube (has had abrasions from tube in past)   Dysrhythmia  PVC's   Eczema    Fibromyalgia    Gastritis    GERD (gastroesophageal reflux disease)    Gonalgia 07/29/2015   Overview:  Overview:  The patient has had bilateral intra-articular Hyalgan injections done on 07/16/2014 and although she seems to do well with this type of therapy,  apparently her insurance company does not want to pay for they Hyalgan. On 11/27/2014 the patient underwent a bilateral genicular nerve block with excellent results. On 01/28/2015 she had a right knee genicular radiofrequency ablatio   Gout    H/O cardiovascular disorder 03/10/2015   H/O surgical procedure 12/05/2012   Overview:  LSG ([facility] - April 2013)     H/O thyroid disease 03/10/2015   Headache    Herpes    History of artificial joint 07/29/2015   History of hiatal hernia    Hypertension    Hypomagnesemia    Hypothyroidism    IDA (iron deficiency anemia) 05/28/2019   LBP (low back pain) 07/29/2015   Neuromuscular disorder (HCC)    Obesity    PCOS (polycystic ovarian syndrome)    Primary osteoarthritis of both knees 07/29/2015   Sleep apnea    not using a Cpap machine at this time - most recent test mild apnea does not qualify for test (not OSA)   Thyroid nodule bilateral   Umbilical hernia     SURGICAL HISTORY: Past Surgical History:  Procedure Laterality Date   APPENDECTOMY     CARPAL TUNNEL RELEASE Bilateral    CHOLECYSTECTOMY     COLONOSCOPY WITH PROPOFOL N/A 02/11/2021   Procedure: COLONOSCOPY WITH PROPOFOL;  Surgeon: Toledo, Benay Pike, MD;  Location: [facility];  Service: Gastroenterology;  Laterality: N/A;   DIAGNOSTIC LAPAROSCOPY     ESOPHAGOGASTRODUODENOSCOPY (EGD) WITH PROPOFOL N/A 02/11/2021   Procedure: ESOPHAGOGASTRODUODENOSCOPY (EGD) WITH PROPOFOL;  Surgeon: Toledo, Benay Pike, MD;  Location: [facility];  Service: Gastroenterology;  Laterality: N/A;   HIATAL HERNIA REPAIR     JOINT REPLACEMENT Bilateral hip   LAPAROSCOPIC PARTIAL GASTRECTOMY     left trigger finger     peniculectomy N/A 07/05/2018   ROUX-EN-Y GASTRIC BYPASS  06/03/2017   SHOULDER ARTHROSCOPY Right    THYROIDECTOMY N/A 11/12/2015   Procedure: THYROIDECTOMY;  Surgeon: Clyde Canterbury, MD;  Location: [facility] OR[facility];  Service: ENT;  Laterality: N/A;   TOTAL HIP ARTHROPLASTY Right 11/27/2019   Procedure:  TOTAL HIP ARTHROPLASTY ANTERIOR APPROACH;  Surgeon: Hessie Knows, MD;  Location: [facility] OR[facility];  Service: Orthopedics;  Laterality: Right;   TRIGGER FINGER RELEASE Right    TRIGGER FINGER RELEASE Right 04/24/2020   Procedure: Right ring and middle trigger release;  Surgeon: Hessie Knows, MD;  Location: [facility];  Service: Orthopedics;  Laterality: Right;    SOCIAL HISTORY: Social History   Socioeconomic History   Marital status: Married    Spouse name: Not on file   Number of children: 0   Years of education: 12   Highest education level: High school graduate  Occupational History   Occupation: Disabled  Tobacco Use   Smoking status: Never   Smokeless tobacco: Never  Vaping Use   Vaping Use: Never used  Substance and Sexual Activity   Alcohol use: No    Alcohol/week: 0.0 standard drinks   Drug use: No   Sexual activity: Not Currently    Birth control/protection: Injection  Other Topics Concern   Not on file  Social History Narrative   Not on file   Social Determinants of Health   Financial  Resource Strain: Not on file  Food Insecurity: Not on file  Transportation Needs: Not on file  Physical Activity: Not on file  Stress: Not on file  Social Connections: Not on file  Intimate Partner Violence: Not on file    FAMILY HISTORY: Family History  Problem Relation Age of Onset   Anxiety disorder Mother    Depression Mother    Alcohol abuse Mother    Diabetes Mother    Hypertension Mother    Kidney cancer Mother    Sleep apnea Mother    Alcohol abuse Father    Anxiety disorder Father    Depression Father    Post-traumatic stress disorder Father    Kidney failure Father    COPD Father    Diabetes Father    Hypertension Father    Sleep apnea Father    Depression Brother    Diabetes Brother    Hypertension Brother    Sleep apnea Brother    Breast cancer Paternal Aunt    Bladder Cancer Neg Hx    Prostate cancer Neg Hx     ALLERGIES:  is allergic to bactrim  [sulfamethoxazole-trimethoprim], omalizumab, ciprofloxacin, shellfish allergy, and nsaids.  MEDICATIONS:  Current Outpatient Medications  Medication Sig Dispense Refill   ACCU-CHEK AVIVA PLUS test strip      acetaminophen (TYLENOL) 650 MG CR tablet Take 650-1,300 mg by mouth every 8 (eight) hours as needed for pain.     acidophilus (RISAQUAD) CAPS capsule Take 1 capsule by mouth daily.     albuterol (PROVENTIL HFA;VENTOLIN HFA) 108 (90 BASE) MCG/ACT inhaler Inhale 2 puffs into the lungs every 6 (six) hours as needed for wheezing or shortness of breath.     amitriptyline (ELAVIL) 100 MG tablet Take 50 mg by mouth at bedtime.     Ascorbic Acid (VITAMIN C) 1000 MG tablet Take 1,000 mg by mouth daily.      benzonatate (TESSALON) 100 MG capsule Take 100 mg by mouth.     Biotin 10 MG CAPS Take 10 mg by mouth daily.      butalbital-acetaminophen-caffeine (FIORICET) 50-325-40 MG tablet Take 1 tablet by mouth every 6 (six) hours as needed for migraine.      calcium carbonate (TUMS EX) 750 MG chewable tablet Chew 1 tablet by mouth daily.      clonazePAM (KLONOPIN) 0.5 MG tablet Take 1 tablet (0.5 mg total) by mouth 3 (three) times daily as needed for anxiety. 30 tablet 0   Cyanocobalamin (B-12) 2500 MCG TABS Take 2,500 mcg by mouth daily.     diclofenac sodium (VOLTAREN) 1 % GEL Apply 2 g topically 4 (four) times daily. (Patient taking differently: Apply 2 g topically 3 (three) times daily as needed (pain).) 100 g 1   diphenhydrAMINE (BENADRYL) 25 MG tablet Take 25 mg by mouth every 8 (eight) hours as needed for itching or allergies.      DUPIXENT 300 MG/2ML SOPN      EMGALITY 120 MG/ML SOAJ Inject into the skin.     EPINEPHrine 0.3 mg/0.3 mL IJ SOAJ injection Inject 0.3 mg into the muscle as needed for anaphylaxis.      ergocalciferol (VITAMIN D2) 1.25 MG (50000 UT) capsule Take 50,000 Units by mouth 2 (two) times a week.      famotidine (PEPCID) 20 MG tablet Take 20 mg by mouth at bedtime.      fluticasone furoate-vilanterol (BREO ELLIPTA) 200-25 MCG/INH AEPB Inhale 1 puff into the lungs daily.     furosemide (  LASIX) 20 MG tablet Take 20 mg by mouth daily as needed.     glucose blood (ACCU-CHEK AVIVA PLUS) test strip Use 2 (two) times daily     guaiFENesin-codeine (ROBITUSSIN AC) 100-10 MG/5ML syrup Take by mouth.     levocetirizine (XYZAL) 5 MG tablet Take 5 mg by mouth at bedtime.      magnesium oxide (MAG-OX) 400 MG tablet Take 1,200 mg by mouth 2 (two) times daily. Take 1200 mg by mouth in the morning and 1200 mg at bedtime     meclizine (ANTIVERT) 25 MG tablet Take 25 mg by mouth 3 (three) times daily as needed for dizziness.      metFORMIN (GLUCOPHAGE) 1000 MG tablet Take 1,000 mg by mouth 2 (two) times daily with a meal.      metoprolol tartrate (LOPRESSOR) 50 MG tablet Take 75 mg by mouth 2 (two) times daily.      montelukast (SINGULAIR) 10 MG tablet Take 10 mg by mouth at bedtime.      Multiple Vitamin (TAB-A-VITE) TABS      naloxone (NARCAN) nasal spray 4 mg/0.1 mL Place 1 spray into the nose as needed for up to 365 doses (for opioid-induced respiratory depresssion). In case of emergency (overdose), spray once into each nostril. If no response within 3 minutes, repeat application and call [id number]. 1 each 0   naphazoline-pheniramine (NAPHCON-A) 0.025-0.3 % ophthalmic solution Place 1 drop into both eyes 4 (four) times daily as needed for eye irritation.     nitrofurantoin, macrocrystal-monohydrate, (MACROBID) 100 MG capsule Take 1 capsule (100 mg total) by mouth daily. 30 capsule 11   omeprazole (PRILOSEC) 40 MG capsule Take 40 mg by mouth in the morning and at bedtime.     [START ON 05/26/2021] oxyCODONE (OXY IR/ROXICODONE) 5 MG immediate release tablet Take 1 tablet (5 mg total) by mouth 5 (five) times daily. Must last 30 days 150 tablet 0   [START ON 06/25/2021] oxyCODONE (OXY IR/ROXICODONE) 5 MG immediate release tablet Take 1 tablet (5 mg total) by mouth 5 (five) times daily. Must  last 30 days 150 tablet 0   [START ON 07/25/2021] oxyCODONE (OXY IR/ROXICODONE) 5 MG immediate release tablet Take 1 tablet (5 mg total) by mouth 5 (five) times daily. Must last 30 days 150 tablet 0   OZEMPIC, 0.25 OR 0.5 MG/DOSE, 2 MG/1.5ML SOPN Inject 1 mg into the skin.     QUEtiapine (SEROQUEL) 100 MG tablet Take 100 mg by mouth at bedtime.     rizatriptan (MAXALT) 10 MG tablet Take 1 and may repeat in 2 hours for MIgraines, alternate with the disintrgrating tablet.     rizatriptan (MAXALT-MLT) 10 MG disintegrating tablet Take 10 mg by mouth. Take 1 and may repeat in 2 hours  for Migraines     rosuvastatin (CRESTOR) 40 MG tablet Take 40 mg by mouth daily.      sertraline (ZOLOFT) 100 MG tablet Take 200 mg by mouth daily.      spironolactone (ALDACTONE) 25 MG tablet Take 37.5 mg by mouth 2 (two) times daily.      tiZANidine (ZANAFLEX) 4 MG tablet Take 1 tablet (4 mg total) by mouth 2 (two) times daily as needed for muscle spasms. 30 tablet 0   topiramate (TOPAMAX) 100 MG tablet Take 200 mg by mouth daily. Nightly     zolpidem (AMBIEN) 10 MG tablet Take 10 mg by mouth at bedtime as needed for sleep.     zonisamide (ZONEGRAN) 50 MG capsule  Take 150 mg by mouth at bedtime.      levothyroxine (SYNTHROID) 125 MCG tablet Take 250 mcg by mouth daily before breakfast.      lisinopril (ZESTRIL) 5 MG tablet Take 5 mg by mouth at bedtime.      pregabalin (LYRICA) 225 MG capsule Take 1 capsule (225 mg total) by mouth 2 (two) times daily. 180 capsule 0   No current facility-administered medications for this visit.     PHYSICAL EXAMINATION: ECOG PERFORMANCE STATUS: 2 - Symptomatic, <50% confined to bed There were no vitals filed for this visit.  There were no vitals filed for this visit.   Physical Exam Neurological:     Mental Status: She is alert and oriented to person, place, and time.      CMP Latest Ref Rng & Units 04/22/2021  Glucose 70 - 99 mg/dL 144(H)  BUN 6 - 20 mg/dL 32(H)   Creatinine 0.44 - 1.00 mg/dL 1.20(H)  Sodium 135 - 145 mmol/L 132(L)  Potassium 3.5 - 5.1 mmol/L 3.8  Chloride 98 - 111 mmol/L 99  CO2 22 - 32 mmol/L 22  Calcium 8.9 - 10.3 mg/dL 8.9  Total Protein 6.5 - 8.1 g/dL 7.4  Total Bilirubin 0.3 - 1.2 mg/dL 0.6  Alkaline Phos 38 - 126 U/L 63  AST 15 - 41 U/L 34  ALT 0 - 44 U/L 34   CBC Latest Ref Rng & Units 04/22/2021  WBC 4.0 - 10.5 K/uL 13.5(H)  Hemoglobin 12.0 - 15.0 g/dL 13.5  Hematocrit 36.0 - 46.0 % 41.4  Platelets 150 - 400 K/uL 326     LABORATORY DATA:  I have reviewed the data as listed Lab Results  Component Value Date   WBC 13.5 (H) 04/22/2021   HGB 13.5 04/22/2021   HCT 41.4 04/22/2021   MCV 85.2 04/22/2021   PLT 326 04/22/2021   Recent Labs    08/31/20 1254 10/17/20 1308 04/22/21 1139  NA 135 137 132*  K 4.5 4.5 3.8  CL 104 104 99  CO2 '22 22 22  '$ GLUCOSE 147* 201* 144*  BUN 17 17 32*  CREATININE 0.96 0.95 1.20*  CALCIUM 8.2* 8.3* 8.9  GFRNONAA >60 >60 55*  PROT  --  7.1 7.4  ALBUMIN  --  3.6 4.1  AST  --  40 34  ALT  --  43 34  ALKPHOS  --  60 63  BILITOT  --  0.7 0.6    Iron/TIBC/Ferritin/ %Sat    Component Value Date/Time   IRON 84 04/22/2021 1139   TIBC 511 (H) 04/22/2021 1139   FERRITIN 28 04/22/2021 1139   IRONPCTSAT 16 04/22/2021 1139     DG PAIN CLINIC C-ARM 1-60 MIN NO REPORT  Result Date: 04/14/2021 Fluoro was used, but no Radiologist interpretation will be provided. Please refer to "NOTES" tab for provider progress note.    ASSESSMENT & PLAN:  No diagnosis found.  Iron deficiency anemia- Mrs. Witbeck receives intermittent IV iron secondary to malabsorption due to gastric bypass surgery.  She last received IV Venofer on 04/22/2020.  Labs from 04/22/2021 show a hemoglobin of 13.5, saturation 16% and ferritin level of 28.  TIBC is 511.  Vitamin B12 levels 1998.  Recommend she proceed with IV Venofer as planned on Monday, 04/27/2021 with a second dose 1 week later.  Patient in  agreement.  Vitamin B12 deficiency- She is currently on oral B12 tablets.  Vitamin B12 level is 1998.  Leukocytosis- White count has been elevated  on the last 2 lab draws.  WBC is 13.5 today previously 12.1.  Mainly neutrophils.  She has had several teeth pulled recently and has been on prophylactic antibiotics.  Not on steroids.  We will continue to monitor.   Hair loss- She is very concerned about this.  TSH was very low when checked by her PCP.  Synthroid has been adjusted and she is taking 1 tablet instead of 2.  Disposition- Follow-up with lab work (CBC, iron panel, ferritin and B12 level), MD assessment and possible IV iron in 6 months.  All questions were answered. The patient knows to call the clinic with any problems questions or concerns.  Cc Idelle Crouch, MD  I provided 20 minutes of non face-to-face telephone visit time during this encounter, and > 50% was spent counseling as documented under my assessment & plan.  Faythe Casa, NP 04/24/2021 3:05 PM

## 2021-04-27 ENCOUNTER — Other Ambulatory Visit: Payer: Self-pay

## 2021-04-27 ENCOUNTER — Inpatient Hospital Stay: Payer: Medicare HMO

## 2021-04-27 VITALS — BP 117/74 | HR 89 | Temp 97.1°F | Resp 18

## 2021-04-27 DIAGNOSIS — D509 Iron deficiency anemia, unspecified: Secondary | ICD-10-CM

## 2021-04-27 DIAGNOSIS — E039 Hypothyroidism, unspecified: Secondary | ICD-10-CM | POA: Diagnosis not present

## 2021-04-27 DIAGNOSIS — E538 Deficiency of other specified B group vitamins: Secondary | ICD-10-CM | POA: Diagnosis not present

## 2021-04-27 DIAGNOSIS — D508 Other iron deficiency anemias: Secondary | ICD-10-CM | POA: Diagnosis not present

## 2021-04-27 DIAGNOSIS — D72829 Elevated white blood cell count, unspecified: Secondary | ICD-10-CM | POA: Diagnosis not present

## 2021-04-27 DIAGNOSIS — K912 Postsurgical malabsorption, not elsewhere classified: Secondary | ICD-10-CM | POA: Diagnosis not present

## 2021-04-27 DIAGNOSIS — Z79899 Other long term (current) drug therapy: Secondary | ICD-10-CM | POA: Diagnosis not present

## 2021-04-27 DIAGNOSIS — Z9884 Bariatric surgery status: Secondary | ICD-10-CM | POA: Diagnosis not present

## 2021-04-27 DIAGNOSIS — L659 Nonscarring hair loss, unspecified: Secondary | ICD-10-CM | POA: Diagnosis not present

## 2021-04-27 MED ORDER — SODIUM CHLORIDE 0.9 % IV SOLN: 200.0000 mg | Freq: Once | INTRAVENOUS | Status: DC

## 2021-04-27 MED ORDER — SODIUM CHLORIDE 0.9 % IV SOLN
INTRAVENOUS | Status: DC | PRN
  Filled 2021-04-27: qty 250

## 2021-04-27 MED ORDER — IRON SUCROSE 20 MG/ML IV SOLN
200.0000 mg | Freq: Once | INTRAVENOUS | Status: AC
Start: 2021-04-27 — End: 2021-04-27
  Administered 2021-04-27: 200 mg via INTRAVENOUS

## 2021-04-28 ENCOUNTER — Emergency Department: Payer: Medicare HMO

## 2021-04-28 ENCOUNTER — Emergency Department
Admission: EM | Admit: 2021-04-28 | Discharge: 2021-04-28 | Disposition: A | Discharge status: Home / Self Care | Payer: Medicare HMO | Attending: Emergency Medicine | Admitting: Emergency Medicine

## 2021-04-28 ENCOUNTER — Other Ambulatory Visit: Payer: Self-pay

## 2021-04-28 DIAGNOSIS — D72829 Elevated white blood cell count, unspecified: Secondary | ICD-10-CM | POA: Diagnosis not present

## 2021-04-28 DIAGNOSIS — J45909 Unspecified asthma, uncomplicated: Secondary | ICD-10-CM | POA: Insufficient documentation

## 2021-04-28 DIAGNOSIS — E119 Type 2 diabetes mellitus without complications: Secondary | ICD-10-CM | POA: Insufficient documentation

## 2021-04-28 DIAGNOSIS — E039 Hypothyroidism, unspecified: Secondary | ICD-10-CM | POA: Diagnosis not present

## 2021-04-28 DIAGNOSIS — Z96643 Presence of artificial hip joint, bilateral: Secondary | ICD-10-CM | POA: Diagnosis not present

## 2021-04-28 DIAGNOSIS — Z79899 Other long term (current) drug therapy: Secondary | ICD-10-CM | POA: Insufficient documentation

## 2021-04-28 DIAGNOSIS — R109 Unspecified abdominal pain: Secondary | ICD-10-CM | POA: Diagnosis not present

## 2021-04-28 DIAGNOSIS — Z7984 Long term (current) use of oral hypoglycemic drugs: Secondary | ICD-10-CM | POA: Diagnosis not present

## 2021-04-28 DIAGNOSIS — R1031 Right lower quadrant pain: Secondary | ICD-10-CM | POA: Diagnosis not present

## 2021-04-28 DIAGNOSIS — Z7951 Long term (current) use of inhaled steroids: Secondary | ICD-10-CM | POA: Diagnosis not present

## 2021-04-28 DIAGNOSIS — I10 Essential (primary) hypertension: Secondary | ICD-10-CM | POA: Insufficient documentation

## 2021-04-28 LAB — CBC WITH DIFFERENTIAL/PLATELET
Abs Immature Granulocytes: 0.05 10*3/uL (ref 0.00–0.07)
Basophils Absolute: 0.1 10*3/uL (ref 0.0–0.1)
Basophils Relative: 0 %
Eosinophils Absolute: 0.2 10*3/uL (ref 0.0–0.5)
Eosinophils Relative: 2 %
HCT: 43 % (ref 36.0–46.0)
Hemoglobin: 14 g/dL (ref 12.0–15.0)
Immature Granulocytes: 0 %
Lymphocytes Relative: 20 %
Lymphs Abs: 2.8 10*3/uL (ref 0.7–4.0)
MCH: 28.2 pg (ref 26.0–34.0)
MCHC: 32.6 g/dL (ref 30.0–36.0)
MCV: 86.7 fL (ref 80.0–100.0)
Monocytes Absolute: 0.6 10*3/uL (ref 0.1–1.0)
Monocytes Relative: 5 %
Neutro Abs: 10.1 10*3/uL — ABNORMAL HIGH (ref 1.7–7.7)
Neutrophils Relative %: 73 %
Platelets: 346 10*3/uL (ref 150–400)
RBC: 4.96 MIL/uL (ref 3.87–5.11)
RDW: 14.6 % (ref 11.5–15.5)
WBC: 13.9 10*3/uL — ABNORMAL HIGH (ref 4.0–10.5)
nRBC: 0 % (ref 0.0–0.2)

## 2021-04-28 LAB — BASIC METABOLIC PANEL
Anion gap: 11 (ref 5–15)
BUN: 19 mg/dL (ref 6–20)
CO2: 22 mmol/L (ref 22–32)
Calcium: 8.4 mg/dL — ABNORMAL LOW (ref 8.9–10.3)
Chloride: 101 mmol/L (ref 98–111)
Creatinine, Ser: 1.06 mg/dL — ABNORMAL HIGH (ref 0.44–1.00)
GFR, Estimated: >60 mL/min (ref >60)
Glucose, Bld: 108 mg/dL — ABNORMAL HIGH (ref 70–99)
Potassium: 4.5 mmol/L (ref 3.5–5.1)
Sodium: 134 mmol/L — ABNORMAL LOW (ref 135–145)

## 2021-04-28 LAB — URINALYSIS, COMPLETE (UACMP) WITH MICROSCOPIC
Bilirubin Urine: NEGATIVE
Glucose, UA: NEGATIVE mg/dL
Hgb urine dipstick: NEGATIVE
Ketones, ur: NEGATIVE mg/dL
Nitrite: NEGATIVE
Protein, ur: NEGATIVE mg/dL
Specific Gravity, Urine: 1.009 (ref 1.005–1.030)
pH: 6 (ref 5.0–8.0)

## 2021-04-28 LAB — PREGNANCY, URINE: Preg Test, Ur: NEGATIVE

## 2021-04-28 LAB — MAGNESIUM: Magnesium: 2.1 mg/dL (ref 1.7–2.4)

## 2021-04-28 MED ORDER — MORPHINE SULFATE (PF) 4 MG/ML IV SOLN
4.0000 mg | Freq: Once | INTRAVENOUS | Status: AC
Start: 2021-04-28 — End: 2021-04-28
  Administered 2021-04-28: 4 mg via INTRAVENOUS
  Filled 2021-04-28: qty 1

## 2021-04-28 MED ORDER — IOHEXOL 350 MG/ML SOLN
100.0000 mL | Freq: Once | INTRAVENOUS | Status: AC | PRN
  Administered 2021-04-28: 100 mL via INTRAVENOUS
  Filled 2021-04-28: qty 100

## 2021-04-28 MED ORDER — ONDANSETRON HCL 4 MG/2ML IJ SOLN
4.0000 mg | Freq: Once | INTRAMUSCULAR | Status: AC
  Administered 2021-04-28: 4 mg via INTRAVENOUS
  Filled 2021-04-28: qty 2

## 2021-04-28 MED ORDER — FENTANYL CITRATE (PF) 100 MCG/2ML IJ SOLN
100.0000 ug | Freq: Once | INTRAMUSCULAR | Status: AC
Start: 2021-04-28 — End: 2021-04-28
  Administered 2021-04-28: 100 ug via INTRAVENOUS
  Filled 2021-04-28: qty 2

## 2021-04-28 NOTE — ED Triage Notes (Signed)
Pt states for the past few days she has been having frequent muscle spasms in her sides, legs and fingers. Pt states she had an iron infusion yesterday for chronically low iron. Pt denies abd pain, chest pain, or shortness of breath.

## 2021-04-28 NOTE — ED Provider Notes (Signed)
[facility] Emergency Department Provider Note ____________________________________________   Event Date/Time   First MD Initiated Contact with Patient 04/28/21 1954     (approximate)  I have reviewed the triage vital signs and the nursing notes.   HISTORY  Chief Complaint Spasms  HPI Karen Lewis is a 50 y.o. female with history of anemia, diabetes, fibromyalgia and remaining history as listed below presents to the emergency department for treatment and evaluation of right side pain. No relief with heating pad and oxycodone. She has a history of low magnesium and low iron. She had iron infusion yesterday.         Past Medical History:  Diagnosis Date   Anemia    Anginal pain (HCC)    Anxiety    Arthralgia of hip 07/29/2015   Arthritis    Arthritis, degenerative 07/29/2015   Asthma    Cephalalgia 07/25/2014   Dependence on unknown drug ([place])    multiplt controlled drug dependence   Depression    Diabetes mellitus without complication ([place])    Difficult intubation    per pt needs small tube (has had abrasions from tube in past)   Dysrhythmia    PVC's   Eczema    Fibromyalgia    Gastritis    GERD (gastroesophageal reflux disease)    Gonalgia 07/29/2015   Overview:  Overview:  The patient has had bilateral intra-articular Hyalgan injections done on 07/16/2014 and although she seems to do well with this type of therapy, apparently her insurance company does not want to pay for they Hyalgan. On 11/27/2014 the patient underwent a bilateral genicular nerve block with excellent results. On 01/28/2015 she had a right knee genicular radiofrequency ablatio   Gout    H/O cardiovascular disorder 03/10/2015   H/O surgical procedure 12/05/2012   Overview:  LSG ([facility] - April 2013)     H/O thyroid disease 03/10/2015   Headache    Herpes    History of artificial joint 07/29/2015   History of hiatal hernia    Hypertension    Hypomagnesemia    Hypothyroidism     IDA (iron deficiency anemia) 05/28/2019   LBP (low back pain) 07/29/2015   Neuromuscular disorder ([place])    Obesity    PCOS (polycystic ovarian syndrome)    Primary osteoarthritis of both knees 07/29/2015   Sleep apnea    not using a Cpap machine at this time - most recent test mild apnea does not qualify for test (not OSA)   Thyroid nodule bilateral   Umbilical hernia     Patient Active Problem List   Diagnosis Date Noted   DDD (degenerative disc disease), lumbosacral 04/14/2021   Chronic use of opiate for therapeutic purpose 01/21/2021   Colon cancer screening 12/04/2020   Barrett's esophagus without dysplasia [id number]   Uncomplicated opioid dependence ([place]) 09/25/2020   Pain and numbness of left upper extremity 03/13/2020   Cervical radiculitis (Left) 03/13/2020   Pharmacologic therapy 02/19/2020   S/P THR (total hip replacement) (Right) (11/27/2019) 02/19/2020   Aftercare following joint replacement surgery 12/23/2019   History of total hip replacement (Bilateral) 11/27/2019   Bilateral primary osteoarthritis of knee 09/14/2019   Lymphedema 08/20/2019   Type II diabetes mellitus with complication ([place]) [id number]   Atypical chest pain 08/13/2019   Heart palpitations 08/13/2019   Leg swelling 07/15/2019   Elevated LFTs 06/07/2019   Edema of both legs 06/04/2019   Hypocalcemia 06/04/2019   Adult failure to thrive syndrome 06/03/2019  IDA (iron deficiency anemia) 05/28/2019   Mycobacterium abscessus infection 02/22/2019   Surgical wound, non healing 09/27/2018   Wound infection after surgery 08/11/2018   Panniculitis 07/05/2018   Allergy to iodine 04/26/2018   H/O allergy to shellfish 04/26/2018   Encounter for dental examination 02/21/2018   Spondylosis without myelopathy or radiculopathy, lumbosacral region 02/21/2018   History of gastric bypass 02/15/2018   Depression 01/19/2018   Secondary Osteoarthritis of knee (Bilateral) (R>L) 01/02/2018   Encounter for  dental exam and cleaning w/o abnormal findings 12/19/2017   Post op infection 12/05/2017   Osteoarthritis of hip (Right) 07/07/2017   Osteoarthritis of shoulder (Left) 05/31/2017   Upper extremity pain 04/21/2017   Chronic shoulder pain (Left) 04/21/2017   Chronic pain syndrome 12/02/2016   Neurogenic pain 12/02/2016   Chest pain with low risk of acute coronary syndrome 08/04/2016   SOB (shortness of breath) on exertion 08/04/2016   Dysphagia, unspecified 05/04/2016   Gastroesophageal reflux disease without esophagitis 05/04/2016   GERD (gastroesophageal reflux disease) 05/04/2016   Vitamin D insufficiency 11/27/2015   Nontoxic goiter 11/12/2015   Other psychoactive substance dependence, uncomplicated ([place]) [id number]   Controlled drug dependence ([place]) 10/13/2015   Lumbar spondylosis 09/18/2015   Hypomagnesemia 08/27/2015   Class 3 severe obesity due to excess calories with serious comorbidity and body mass index (BMI) of 60.0 to 69.9 in adult ([place]) 08/27/2015   Long term current use of opiate analgesic 07/29/2015   Long term prescription opiate use 07/29/2015   Opiate use 07/29/2015   Encounter for therapeutic drug level monitoring 07/29/2015   Opiate dependence ([place]) 07/29/2015   Chronic knee pain (1ry area of Pain) (Bilateral) (R>L) 07/29/2015   Chronic low back pain (3ry area of Pain) (Bilateral) (R>L) w/o sciatica 07/29/2015   Lumbar facet syndrome (Bilateral) (R>L) 07/29/2015   Secondary osteoarthritis of multiple sites 07/29/2015   Grade 1 (1.4 cm) Anterolisthesis of L4 over L5 07/29/2015   Chronic hip pain (2ry area of Pain) (Right) 07/29/2015   S/P THR (total hip replacement) (Left) 07/29/2015   History of methicillin resistant staphylococcus aureus (MRSA) 07/29/2015   History of bariatric surgery 07/29/2015   History of methicillin resistant Staphylococcus aureus infection 07/29/2015   Eczema 05/21/2015   Headache, migraine 05/21/2015   Bilateral polycystic ovarian  syndrome 05/21/2015   Disease of thyroid gland 05/21/2015   Major depressive disorder, single episode 05/21/2015   Dermatitis 05/21/2015   Obstructive apnea 04/30/2015   History of asthma 03/10/2015   Binge eating disorder 03/10/2015   Fibromyalgia 03/10/2015   Anxiety, generalized 03/10/2015   Insomnia, persistent 03/10/2015   Depression, major, recurrent, moderate ([place]) 03/10/2015   Avitaminosis D 04/30/2014   Hyperlipidemia 04/25/2014   Hyperlipidemia due to type 2 diabetes mellitus ([place]) [id number]   Uncomplicated asthma [id number]   Asthma 03/19/2014   Goiter, nontoxic, multinodular 10/09/2013   Apnea, sleep 12/05/2012   History of surgical procedure 12/05/2012   Type 2 diabetes mellitus without complication, without long-term current use of insulin ([place]) 12/05/2012   Hypertension associated with diabetes ([place]) 12/05/2012    Past Surgical History:  Procedure Laterality Date   APPENDECTOMY     CARPAL TUNNEL RELEASE Bilateral    CHOLECYSTECTOMY     COLONOSCOPY WITH PROPOFOL N/A 02/11/2021   Procedure: COLONOSCOPY WITH PROPOFOL;  Surgeon: Toledo, Benay Pike, MD;  Location: [facility] ENDOSCOPY;  Service: Gastroenterology;  Laterality: N/A;   DIAGNOSTIC LAPAROSCOPY     ESOPHAGOGASTRODUODENOSCOPY (EGD) WITH PROPOFOL N/A 02/11/2021   Procedure: ESOPHAGOGASTRODUODENOSCOPY (EGD)  WITH PROPOFOL;  Surgeon: Toledo, Benay Pike, MD;  Location: [facility] ENDOSCOPY;  Service: Gastroenterology;  Laterality: N/A;   HIATAL HERNIA REPAIR     JOINT REPLACEMENT Bilateral hip   LAPAROSCOPIC PARTIAL GASTRECTOMY     left trigger finger     peniculectomy N/A 07/05/2018   ROUX-EN-Y GASTRIC BYPASS  06/03/2017   SHOULDER ARTHROSCOPY Right    THYROIDECTOMY N/A 11/12/2015   Procedure: THYROIDECTOMY;  Surgeon: Clyde Canterbury, MD;  Location: [facility] ORS;  Service: ENT;  Laterality: N/A;   TOTAL HIP ARTHROPLASTY Right 11/27/2019   Procedure: TOTAL HIP ARTHROPLASTY ANTERIOR APPROACH;  Surgeon: Hessie Knows, MD;  Location: [facility]  ORS;  Service: Orthopedics;  Laterality: Right;   TRIGGER FINGER RELEASE Right    TRIGGER FINGER RELEASE Right 04/24/2020   Procedure: Right ring and middle trigger release;  Surgeon: Hessie Knows, MD;  Location: [facility] ORS;  Service: Orthopedics;  Laterality: Right;    Prior to Admission medications   Medication Sig Start Date End Date Taking? Authorizing Provider  ACCU-CHEK AVIVA PLUS test strip  03/13/19   [provider]  acetaminophen (TYLENOL) 650 MG CR tablet Take 650-1,300 mg by mouth every 8 (eight) hours as needed for pain.    [provider]  acidophilus (RISAQUAD) CAPS capsule Take 1 capsule by mouth daily.    [provider]  albuterol (PROVENTIL HFA;VENTOLIN HFA) 108 (90 BASE) MCG/ACT inhaler Inhale 2 puffs into the lungs every 6 (six) hours as needed for wheezing or shortness of breath.    [provider]  amitriptyline (ELAVIL) 100 MG tablet Take 50 mg by mouth at bedtime.    [provider]  Ascorbic Acid (VITAMIN C) 1000 MG tablet Take 1,000 mg by mouth daily.     [provider]  benzonatate (TESSALON) 100 MG capsule Take 100 mg by mouth.    [provider]  Biotin 10 MG CAPS Take 10 mg by mouth daily.     [provider]  butalbital-acetaminophen-caffeine (FIORICET) 50-325-40 MG tablet Take 1 tablet by mouth every 6 (six) hours as needed for migraine.  08/24/19   [provider]  calcium carbonate (TUMS EX) 750 MG chewable tablet Chew 1 tablet by mouth daily.     [provider]  clonazePAM (KLONOPIN) 0.5 MG tablet Take 1 tablet (0.5 mg total) by mouth 3 (three) times daily as needed for anxiety. 11/30/19   Duanne Guess, PA-C  Cyanocobalamin (B-12) 2500 MCG TABS Take 2,500 mcg by mouth daily.    [provider]  diclofenac sodium (VOLTAREN) 1 % GEL Apply 2 g topically 4 (four) times daily. Patient taking differently: Apply 2 g topically 3 (three) times daily as needed (pain).  05/29/19   Noemi Chapel, MD  diphenhydrAMINE (BENADRYL) 25 MG tablet Take 25 mg by mouth every 8 (eight) hours as needed for itching or allergies.     [provider]  [place] 300 MG/2ML SOPN  11/03/20   [provider]  EMGALITY 120 MG/ML SOAJ Inject into the skin. 07/15/20   [provider]  EPINEPHrine 0.3 mg/0.3 mL IJ SOAJ injection Inject 0.3 mg into the muscle as needed for anaphylaxis.     [provider]  ergocalciferol (VITAMIN D2) 1.25 MG (50000 UT) capsule Take 50,000 Units by mouth 2 (two) times a week.  10/25/19   [provider]  famotidine (PEPCID) 20 MG tablet Take 20 mg by mouth at bedtime.    [provider]  fluticasone furoate-vilanterol (BREO ELLIPTA)  200-25 MCG/INH AEPB Inhale 1 puff into the lungs daily.    [provider]  furosemide (LASIX) 20 MG tablet Take 20 mg by mouth daily as needed. 07/16/20   [provider]  glucose blood (ACCU-CHEK AVIVA PLUS) test strip Use 2 (two) times daily 09/29/18   [provider]  guaiFENesin-codeine (ROBITUSSIN AC) 100-10 MG/5ML syrup Take by mouth. 12/19/20   [provider]  levocetirizine (XYZAL) 5 MG tablet Take 5 mg by mouth at bedtime.  09/12/19   [provider]  levothyroxine (SYNTHROID) 125 MCG tablet Take 250 mcg by mouth daily before breakfast.  08/31/18 02/19/21  [provider]  lisinopril (ZESTRIL) 5 MG tablet Take 5 mg by mouth at bedtime.  12/11/18 02/19/21  [provider]  magnesium oxide (MAG-OX) 400 MG tablet Take 1,200 mg by mouth 2 (two) times daily. Take 1200 mg by mouth in the morning and 1200 mg at bedtime 10/24/19   [provider]  meclizine (ANTIVERT) 25 MG tablet Take 25 mg by mouth 3 (three) times daily as needed for dizziness.  09/19/19   [provider]  metFORMIN (GLUCOPHAGE) 1000 MG tablet Take 1,000 mg by mouth 2 (two) times daily with a meal.     [provider]  metoprolol  tartrate (LOPRESSOR) 50 MG tablet Take 75 mg by mouth 2 (two) times daily.  03/16/19   [provider]  montelukast (SINGULAIR) 10 MG tablet Take 10 mg by mouth at bedtime.     [provider]  Multiple Vitamin (TAB-A-VITE) TABS  07/15/20   [provider]  naloxone [facility]) nasal spray 4 mg/0.1 mL Place 1 spray into the nose as needed for up to 365 doses (for opioid-induced respiratory depresssion). In case of emergency (overdose), spray once into each nostril. If no response within 3 minutes, repeat application and call [id number]. 01/21/21 01/21/22  Milinda Pointer, MD  naphazoline-pheniramine (NAPHCON-A) 0.025-0.3 % ophthalmic solution Place 1 drop into both eyes 4 (four) times daily as needed for eye irritation.    [provider]  nitrofurantoin, macrocrystal-monohydrate, (MACROBID) 100 MG capsule Take 1 capsule (100 mg total) by mouth daily. 12/01/20   Bjorn Loser, MD  omeprazole (PRILOSEC) 40 MG capsule Take 40 mg by mouth in the morning and at bedtime.    [provider]  oxyCODONE (OXY IR/ROXICODONE) 5 MG immediate release tablet Take 1 tablet (5 mg total) by mouth 5 (five) times daily. Must last 30 days 05/26/21 06/25/21  Milinda Pointer, MD  oxyCODONE (OXY IR/ROXICODONE) 5 MG immediate release tablet Take 1 tablet (5 mg total) by mouth 5 (five) times daily. Must last 30 days 06/25/21 07/25/21  Milinda Pointer, MD  oxyCODONE (OXY IR/ROXICODONE) 5 MG immediate release tablet Take 1 tablet (5 mg total) by mouth 5 (five) times daily. Must last 30 days 07/25/21 08/24/21  Milinda Pointer, MD  OZEMPIC, 0.25 OR 0.5 MG/DOSE, 2 MG/1.5ML SOPN Inject 1 mg into the skin. 07/17/20   [provider]  pregabalin (LYRICA) 225 MG capsule Take 1 capsule (225 mg total) by mouth 2 (two) times daily. 10/02/20 02/19/21  Milinda Pointer, MD  QUEtiapine (SEROQUEL) 100 MG tablet Take 100 mg by mouth at bedtime.    [provider]  rizatriptan (MAXALT)  10 MG tablet Take 1 and may repeat in 2 hours for MIgraines, alternate with the disintrgrating tablet. 09/04/20   [provider]  rizatriptan (MAXALT-MLT) 10 MG disintegrating tablet Take 10 mg by mouth. Take 1 and may  repeat in 2 hours  for Migraines 08/16/20   [provider]  rosuvastatin (CRESTOR) 40 MG tablet Take 40 mg by mouth daily.  02/28/19   [provider]  sertraline (ZOLOFT) 100 MG tablet Take 200 mg by mouth daily.  03/13/19   [provider]  spironolactone (ALDACTONE) 25 MG tablet Take 37.5 mg by mouth 2 (two) times daily.     [provider]  tiZANidine (ZANAFLEX) 4 MG tablet Take 1 tablet (4 mg total) by mouth 2 (two) times daily as needed for muscle spasms. 11/30/19   Duanne Guess, PA-C  topiramate (TOPAMAX) 100 MG tablet Take 200 mg by mouth daily. Nightly 08/19/20   [provider]  zolpidem (AMBIEN) 10 MG tablet Take 10 mg by mouth at bedtime as needed for sleep.    [provider]  zonisamide (ZONEGRAN) 50 MG capsule Take 150 mg by mouth at bedtime.     [provider]    Allergies Bactrim [sulfamethoxazole-trimethoprim], Omalizumab, Ciprofloxacin, Shellfish allergy, and Nsaids  Family History  Problem Relation Age of Onset   Anxiety disorder Mother    Depression Mother    Alcohol abuse Mother    Diabetes Mother    Hypertension Mother    Kidney cancer Mother    Sleep apnea Mother    Alcohol abuse Father    Anxiety disorder Father    Depression Father    Post-traumatic stress disorder Father    Kidney failure Father    COPD Father    Diabetes Father    Hypertension Father    Sleep apnea Father    Depression Brother    Diabetes Brother    Hypertension Brother    Sleep apnea Brother    Breast cancer Paternal Aunt    Bladder Cancer Neg Hx    Prostate cancer Neg Hx     Social History Social History   Tobacco Use   Smoking status: Never   Smokeless tobacco: Never  Vaping Use    Vaping Use: Never used  Substance Use Topics   Alcohol use: No    Alcohol/week: 0.0 standard drinks   Drug use: No    Review of Systems  Constitutional: No fever/chills Eyes: No visual changes. ENT: No sore throat. Cardiovascular: Denies chest pain. Respiratory: Denies shortness of breath. Gastrointestinal: No abdominal pain.  Positive for nausea, no vomiting.  No diarrhea.  No constipation. Genitourinary: Negative for dysuria. Musculoskeletal: Negative for back pain. Positive for right flank pain. Skin: Negative for rash. Neurological: Negative for headaches, focal weakness or numbness. ____________________________________________   PHYSICAL EXAM:  VITAL SIGNS: ED Triage Vitals  Enc Vitals Group     BP 04/28/21 1936 125/79     Pulse Rate 04/28/21 1936 87     Resp 04/28/21 1936 18     Temp 04/28/21 1936 99.1 F (37.3 C)     Temp Source 04/28/21 1936 Oral     SpO2 04/28/21 1936 95 %     Weight 04/28/21 1937 (!) 343 lb (155.6 kg)     Height 04/28/21 1937 '5\' 4"'$  (1.626 m)     Head Circumference --      Peak Flow --      Pain Score 04/28/21 1937 4     Pain Loc --      Pain Edu? --      Excl. in [place]? --     Constitutional: Alert and oriented. Well appearing and in no acute distress. Eyes: Conjunctivae are normal.  Head: Atraumatic. Nose: No congestion/rhinnorhea. Mouth/Throat: Mucous membranes are moist. Oropharynx non-erythematous. Neck: No stridor.   Hematological/Lymphatic/Immunilogical: No cervical lymphadenopathy. Cardiovascular: Normal rate, regular rhythm. Grossly normal heart sounds.  Good peripheral circulation. Respiratory: Normal respiratory effort.  No retractions. Lungs CTAB. Gastrointestinal: Soft and nontender. No distention. No abdominal bruits. No CVA tenderness. Right lower quadrant tenderness with palpation. Musculoskeletal: No lower extremity tenderness nor edema.  No joint effusions. Neurologic:  Normal speech and language. No gross focal  neurologic deficits are appreciated. No gait instability. Skin:  Skin is warm, dry and intact. No rash noted. Psychiatric: Mood and affect are normal. Speech and behavior are normal.  ____________________________________________   LABS (all labs ordered are listed, but only abnormal results are displayed)  Labs Reviewed  CBC WITH DIFFERENTIAL/PLATELET - Abnormal; Notable for the following components:      Result Value   WBC 13.9 (*)    Neutro Abs 10.1 (*)    All other components within normal limits  BASIC METABOLIC PANEL - Abnormal; Notable for the following components:   Sodium 134 (*)    Glucose, Bld 108 (*)    Creatinine, Ser 1.06 (*)    Calcium 8.4 (*)    All other components within normal limits  URINALYSIS, COMPLETE (UACMP) WITH MICROSCOPIC - Abnormal; Notable for the following components:   Color, Urine YELLOW (*)    APPearance CLEAR (*)    Leukocytes,Ua TRACE (*)    Bacteria, UA RARE (*)    All other components within normal limits  MAGNESIUM  PREGNANCY, URINE   ____________________________________________  EKG  Not indicated. ____________________________________________  RADIOLOGY  ED MD interpretation:    No acute findings on CT abdomen and pelvis with contrast.  I, Sutton Plake, personally viewed and evaluated these images (plain radiographs) as part of my medical decision making, as well as reviewing the written report by the radiologist.  Official radiology report(s): CT ABDOMEN PELVIS W CONTRAST  Result Date: 04/28/2021 CLINICAL DATA:  Right lower quadrant abdominal pain. past few days she has been having frequent muscle spasms in her sides, legs and fingers. Pt states she had an iron infusion yesterday for chronically low iron. Pt denies abd pain, chest pain, or shortness of breath. EXAM: CT ABDOMEN AND PELVIS WITH CONTRAST TECHNIQUE: Multidetector CT imaging of the abdomen and pelvis was performed using the standard protocol following bolus  administration of intravenous contrast. CONTRAST:  154m OMNIPAQUE IOHEXOL 350 MG/ML SOLN COMPARISON:  None. FINDINGS: Lower chest: Subsegmental atelectasis. Hepatobiliary: No focal liver abnormality. Status post cholecystectomy. No biliary dilatation. Pancreas: No focal lesion. Normal pancreatic contour. No surrounding inflammatory changes. No main pancreatic ductal dilatation. Spleen: Normal in size without focal abnormality. Subcentimeter splenule noted. Adrenals/Urinary Tract: No adrenal nodule bilaterally. Bilateral kidneys enhance symmetrically. No hydronephrosis. No hydroureter. The urinary bladder is unremarkable. Stomach/Bowel: Surgical changes related to Roux-en-Y gastric bypass. Stomach is within normal limits. No evidence of bowel wall thickening or dilatation. Appendix appears normal. Vascular/Lymphatic: Inferior vena cava filter with tip terminating just proximal to the renal veins confluence. No abdominal aorta or iliac aneurysm. Mild atherosclerotic plaque of the aorta and its branches. No abdominal, pelvic, or inguinal lymphadenopathy. Reproductive: Uterus and bilateral adnexa are unremarkable. Other: No intraperitoneal free fluid. No intraperitoneal free gas. No organized fluid collection. Musculoskeletal: Small to moderate volume supraumbilical ventral hernia containing fat. No suspicious lytic or blastic osseous lesions. No acute displaced fracture. Multilevel degenerative changes of the spine. Bilateral total hip arthroplasties. IMPRESSION: 1. No acute intra-abdominal or  intrapelvic abnormality. 2. Small to moderate volume fat containing supraumbilical ventral wall hernia. No associated findings to suggest ischemia or bowel obstruction. 3. Other imaging findings of potential clinical significance:: Status post cholecystectomy. Status post Roux-en-Y gastric bypass. Inferior vena caval filter in appropriate position. Bilateral total hip arthroplasties. Aortic Atherosclerosis (ICD10-I70.0).  Electronically Signed   By: Iven Finn M.D.   On: 04/28/2021 23:30    ____________________________________________   PROCEDURES  Procedure(s) performed (including Critical Care):  Procedures  ____________________________________________   INITIAL IMPRESSION / ASSESSMENT AND PLAN     50 year old female presents to the emergency department for treatment and evaluation of right flank pain. She had not complained of abdominal pain, however on exam, she was very tender in the right lower quadrant. No fever.  DIFFERENTIAL DIAGNOSIS  Appendicitis, Kidney stone, musculoskeletal pain  ED COURSE  Labs show mild leukocytosis.  Magnesium is normal.  Plan will be to get a CT of the abdomen and pelvis to rule out appendicitis or other intra-abdominal infection.  CT is overall reassuring.  No acute concerns.  Results discussed with the patient who feels reassured and is comfortable being discharged home.  She will take her pain medications as prescribed and follow-up with primary care if not improving over the next few days.  She was encouraged to return to the emergency department for symptoms of change or worsen if she is unable to schedule an appointment.    ___________________________________________   FINAL CLINICAL IMPRESSION(S) / ED DIAGNOSES  Final diagnoses:  Acute right flank pain     ED Discharge Orders     None        Karen Lewis was evaluated in Emergency Department on 04/28/2021 for the symptoms described in the history of present illness. She was evaluated in the context of the global COVID-19 pandemic, which necessitated consideration that the patient might be at risk for infection with the SARS-CoV-2 virus that causes COVID-19. Institutional protocols and algorithms that pertain to the evaluation of patients at risk for COVID-19 are in a state of rapid change based on information released by regulatory bodies including the CDC and federal and state organizations.  These policies and algorithms were followed during the patient's care in the ED.   Note:  This document was prepared using Dragon voice recognition software and may include unintentional dictation errors.    Victorino Dike, FNP 04/28/21 [id number]    Lavonia Drafts, MD 05/01/21 [phone]

## 2021-04-28 NOTE — Discharge Instructions (Addendum)
Follow up with primary care if not improving over the next few days.  Return to the ER for symptoms that change or worsen if unable to schedule an appointment.

## 2021-05-01 DIAGNOSIS — R091 Pleurisy: Secondary | ICD-10-CM | POA: Diagnosis not present

## 2021-05-01 DIAGNOSIS — R0781 Pleurodynia: Secondary | ICD-10-CM | POA: Diagnosis not present

## 2021-05-01 DIAGNOSIS — R1031 Right lower quadrant pain: Secondary | ICD-10-CM | POA: Diagnosis not present

## 2021-05-01 DIAGNOSIS — R079 Chest pain, unspecified: Secondary | ICD-10-CM | POA: Diagnosis not present

## 2021-05-04 ENCOUNTER — Ambulatory Visit: Payer: Medicare HMO

## 2021-05-05 ENCOUNTER — Telehealth: Payer: Medicare HMO | Admitting: Pain Medicine

## 2021-05-06 ENCOUNTER — Inpatient Hospital Stay: Payer: Medicare HMO

## 2021-05-07 DIAGNOSIS — G43009 Migraine without aura, not intractable, without status migrainosus: Secondary | ICD-10-CM | POA: Diagnosis not present

## 2021-05-14 ENCOUNTER — Inpatient Hospital Stay: Payer: Medicare HMO | Attending: Oncology

## 2021-05-26 ENCOUNTER — Telehealth: Payer: Self-pay | Admitting: Pain Medicine

## 2021-05-26 NOTE — Telephone Encounter (Signed)
Please schedule a virtual visit to determine which procedure is needed.

## 2021-05-27 ENCOUNTER — Inpatient Hospital Stay: Payer: Medicare HMO | Attending: Oncology

## 2021-05-27 VITALS — BP 135/56 | HR 86 | Temp 96.9°F | Resp 18

## 2021-05-27 DIAGNOSIS — Z23 Encounter for immunization: Secondary | ICD-10-CM | POA: Diagnosis not present

## 2021-05-27 DIAGNOSIS — J310 Chronic rhinitis: Secondary | ICD-10-CM | POA: Diagnosis not present

## 2021-05-27 DIAGNOSIS — E890 Postprocedural hypothyroidism: Secondary | ICD-10-CM | POA: Diagnosis not present

## 2021-05-27 DIAGNOSIS — E785 Hyperlipidemia, unspecified: Secondary | ICD-10-CM | POA: Diagnosis not present

## 2021-05-27 DIAGNOSIS — R0609 Other forms of dyspnea: Secondary | ICD-10-CM | POA: Diagnosis not present

## 2021-05-27 DIAGNOSIS — D509 Iron deficiency anemia, unspecified: Secondary | ICD-10-CM | POA: Insufficient documentation

## 2021-05-27 DIAGNOSIS — J4540 Moderate persistent asthma, uncomplicated: Secondary | ICD-10-CM | POA: Diagnosis not present

## 2021-05-27 DIAGNOSIS — Z79899 Other long term (current) drug therapy: Secondary | ICD-10-CM | POA: Diagnosis not present

## 2021-05-27 DIAGNOSIS — Z6841 Body Mass Index (BMI) 40.0 and over, adult: Secondary | ICD-10-CM | POA: Diagnosis not present

## 2021-05-27 DIAGNOSIS — R519 Headache, unspecified: Secondary | ICD-10-CM | POA: Diagnosis not present

## 2021-05-27 DIAGNOSIS — G4733 Obstructive sleep apnea (adult) (pediatric): Secondary | ICD-10-CM | POA: Diagnosis not present

## 2021-05-27 DIAGNOSIS — E1169 Type 2 diabetes mellitus with other specified complication: Secondary | ICD-10-CM | POA: Diagnosis not present

## 2021-05-27 DIAGNOSIS — E1159 Type 2 diabetes mellitus with other circulatory complications: Secondary | ICD-10-CM | POA: Diagnosis not present

## 2021-05-27 DIAGNOSIS — M797 Fibromyalgia: Secondary | ICD-10-CM | POA: Diagnosis not present

## 2021-05-27 DIAGNOSIS — I152 Hypertension secondary to endocrine disorders: Secondary | ICD-10-CM | POA: Diagnosis not present

## 2021-05-27 DIAGNOSIS — Z0000 Encounter for general adult medical examination without abnormal findings: Secondary | ICD-10-CM | POA: Diagnosis not present

## 2021-05-27 DIAGNOSIS — G8929 Other chronic pain: Secondary | ICD-10-CM | POA: Diagnosis not present

## 2021-05-27 MED ORDER — IRON SUCROSE 20 MG/ML IV SOLN
200.0000 mg | Freq: Once | INTRAVENOUS | Status: AC
Start: 2021-05-27 — End: 2021-05-27
  Administered 2021-05-27: 200 mg via INTRAVENOUS
  Filled 2021-05-27: qty 10

## 2021-05-27 MED ORDER — SODIUM CHLORIDE 0.9 % IV SOLN
Freq: Once | INTRAVENOUS | Status: AC
  Filled 2021-05-27: qty 250

## 2021-05-27 MED ORDER — SODIUM CHLORIDE 0.9 % IV SOLN: 200.0000 mg | Freq: Once | INTRAVENOUS | Status: DC

## 2021-05-28 DIAGNOSIS — I152 Hypertension secondary to endocrine disorders: Secondary | ICD-10-CM | POA: Diagnosis not present

## 2021-05-28 DIAGNOSIS — E890 Postprocedural hypothyroidism: Secondary | ICD-10-CM | POA: Diagnosis not present

## 2021-05-28 DIAGNOSIS — E1159 Type 2 diabetes mellitus with other circulatory complications: Secondary | ICD-10-CM | POA: Diagnosis not present

## 2021-05-30 NOTE — Progress Notes (Signed)
Patient: Karen Lewis  Service Category: E/M  Provider: Gaspar Cola, MD  DOB: 1970/09/26  DOS: 06/03/2021  Location: Office  MRN: [id number]  Setting: Ambulatory outpatient  Referring Provider: Idelle Crouch, MD  Type: Established Patient  Specialty: Interventional Pain Management  PCP: Idelle Crouch, MD  Location: Remote location  Delivery: TeleHealth     Virtual Encounter - Pain Management PROVIDER NOTE: Information contained herein reflects review and annotations entered in association with encounter. Interpretation of such information and data should be left to medically-trained personnel. Information provided to patient can be located elsewhere in the medical record under "Patient Instructions". Document created using STT-dictation technology, any transcriptional errors that may result from process are unintentional.    Contact & Pharmacy Preferred: [phone] Home: [phone] (home) Mobile: [phone] (mobile) E-mail: [email or web address], [email or web address] - [address] [place] [address] Phone: [phone] Fax: [phone]   Pre-screening  Karen Lewis offered "in-person" vs "virtual" encounter. She indicated preferring virtual for this encounter.   Reason COVID-19*  Social distancing based on CDC and AMA recommendations.   I contacted Karen Lewis on 06/03/2021 via telephone.      I clearly identified myself as Gaspar Cola, MD. I verified that I was speaking with the correct person using two identifiers (Name: Karen Lewis, and date of birth: 04/08/1971).  Consent I sought verbal advanced consent from Karen Lewis for virtual visit interactions. I informed Karen Lewis of possible security and privacy concerns, risks, and limitations associated with providing "not-in-person" medical evaluation and management services. I also informed Karen Lewis of the availability of "in-person" appointments. Finally, I informed  her that there would be a charge for the virtual visit and that she could be  personally, fully or partially, financially responsible for it. Karen Lewis expressed understanding and agreed to proceed.   Historic Elements   Karen Lewis is a 50 y.o. year old, female patient evaluated today after our last contact on 05/26/2021. Karen Lewis  has a past medical history of Anemia, Anginal pain ([place]), Anxiety, Arthralgia of hip (07/29/2015), Arthritis, Arthritis, degenerative (07/29/2015), Asthma, Cephalalgia (07/25/2014), Dependence on unknown drug ([place]), Depression, Diabetes mellitus without complication ([place]), Difficult intubation, Dysrhythmia, Eczema, Fibromyalgia, Gastritis, GERD (gastroesophageal reflux disease), Gonalgia (07/29/2015), Gout, H/O cardiovascular disorder (03/10/2015), H/O surgical procedure (12/05/2012), H/O thyroid disease (03/10/2015), Headache, Herpes, History of artificial joint (07/29/2015), History of hiatal hernia, Hypertension, Hypomagnesemia, Hypothyroidism, IDA (iron deficiency anemia) (05/28/2019), LBP (low back pain) (07/29/2015), Neuromuscular disorder ([place]), Obesity, PCOS (polycystic ovarian syndrome), Primary osteoarthritis of both knees (07/29/2015), Sleep apnea, Thyroid nodule (bilateral), and Umbilical hernia. She also  has a past surgical history that includes Laparoscopic partial gastrectomy; Shoulder arthroscopy (Right); Carpal tunnel release (Bilateral); Diagnostic laparoscopy; Cholecystectomy; Trigger finger release (Right); Thyroidectomy (N/A, 11/12/2015); left trigger finger; Roux-en-Y Gastric Bypass (06/03/2017); Hiatal hernia repair; peniculectomy (N/A, 07/05/2018); Total hip arthroplasty (Right, 11/27/2019); Joint replacement (Bilateral, hip); Appendectomy; Trigger finger release (Right, 04/24/2020); Colonoscopy with propofol (N/A, 02/11/2021); and Esophagogastroduodenoscopy (egd) with propofol (N/A, 02/11/2021). Karen Lewis has a current medication list which includes the following  prescription(s): accu-chek aviva plus, acetaminophen, acidophilus, albuterol, amitriptyline, vitamin c, benzonatate, biotin, butalbital-acetaminophen-caffeine, calcium carbonate, clonazepam, b-12, diclofenac sodium, diphenhydramine, dupixent, emgality, epinephrine, ergocalciferol, famotidine, fluticasone furoate-vilanterol, furosemide, accu-chek aviva plus, guaifenesin-codeine, levocetirizine, levothyroxine, lisinopril, magnesium oxide, meclizine, metformin, metoprolol tartrate, montelukast, tab-a-vite, naloxone, naphazoline-pheniramine, nitrofurantoin (macrocrystal-monohydrate), omeprazole, oxycodone, [START ON 06/25/2021] oxycodone, [START ON 07/25/2021] oxycodone, ozempic (0.25 or  0.5 mg/dose), pregabalin, quetiapine, rizatriptan, rizatriptan, rosuvastatin, sertraline, spironolactone, tizanidine, topiramate, zolpidem, and zonisamide. She  reports that she has never smoked. She has never used smokeless tobacco. She reports that she does not drink alcohol and does not use drugs. Karen Lewis is allergic to bactrim [sulfamethoxazole-trimethoprim], omalizumab, ciprofloxacin, shellfish allergy, and nsaids.   HPI  Today, she is being contacted for worsening of previously known (established) problem.  The patient indicates currently having an increase in her low back pain and knee pain.  She refers that out of the 2 of the worst 1 is the low back pain.  She describes the pain as being bilateral with the right side currently being worse than the left.  This pain is primarily in the lower back and it refers down to the buttocks area but it does not go all the way down into her feet.  Provocative hyperextension on rotation maneuvers of the lumbar spine continue to reproduce pain.  This again suggest facet joint etiology.  The patient has a longstanding history of bilateral lumbar facet syndrome secondary to osteoarthritis of the lumbar spine.  Her last pot at the procedure for the facet joints was done on 04/14/2021.  In  addition, the patient is beginning to also experience bilateral knee pain.  We had previously done Zilretta (extended release triamcinolone) injections in both knees on 02/19/2021.  This Orvan Seen is supposed to last approximately 3 months and it would seem that that is exactly what is happening with that.  We plan to bring her in and do the bilateral lumbar facet blocks first and then at a later time we will bring her in for the knee injections.  Unfortunately, the patient continues to tilt the scale at 343 pounds and this will continue to adversely affect her lumbar spine and knees.  She has already had both hips replaced.  Her weight is continuing to destroy her knees and her spine.  Unfortunately, even if she was able to have bilateral knee replacements, there are no spine replacements.  Pharmacotherapy Assessment   Analgesic: Oxycodone IR 5 mg 1 tab PO 5X/day (#150/mo) (25 mg/day) MME/day: 37.5 mg/day.   Monitoring: [place] PMP: PDMP reviewed during this encounter.       Pharmacotherapy: No side-effects or adverse reactions reported. Compliance: No problems identified. Effectiveness: Clinically acceptable. Plan: Refer to "POC". UDS:  Summary  Date Value Ref Range Status  02/19/2020 Note  Final    Comment:    ==================================================================== [employer] [facility] [employer] (MW) ==================================================================== Test                             Result       Flag       Units  Drug Present and Declared for Prescription Verification   7-aminoclonazepam              44           EXPECTED   ng/mg creat    7-aminoclonazepam is an expected metabolite of clonazepam. Source of    clonazepam is a scheduled prescription medication.  Drug Present not Declared for Prescription Verification   Oxycodone                      199          UNEXPECTED ng/mg creat   Oxymorphone  127          UNEXPECTED ng/mg creat   Noroxycodone                    1026         UNEXPECTED ng/mg creat   Noroxymorphone                 56           UNEXPECTED ng/mg creat    Sources of oxycodone are scheduled prescription medications.    Oxymorphone, noroxycodone, and noroxymorphone are expected    metabolites of oxycodone. Oxymorphone is also available as a    scheduled prescription medication.  Drug Absent but Declared for Prescription Verification   Butalbital                     Not Detected UNEXPECTED ==================================================================== Test                      Result    Flag   Units      Ref Range   Creatinine              165              mg/dL      >=20 ==================================================================== Declared Medications:  The flagging and interpretation on this report are based on the  following declared medications.  Unexpected results may arise from  inaccuracies in the declared medications.   **Note: The testing scope of this panel includes these medications:   Butalbital (Fioricet)  Clonazepam (Klonopin)   **Note: The testing scope of this panel does not include the  following reported medications:   Acetaminophen (Tylenol)  Acetaminophen (Fioricet)  Albuterol (Ventolin HFA)  Amitriptyline (Elavil)  Biotin  Caffeine (Fioricet)  Calcium (Tums)  Diphenhydramine (Benadryl)  Epinephrine (EpiPen)  Eye Drops  Famotidine (Pepcid)  Levocetirizine (Xyzal)  Levothyroxine (Synthroid)  Lisinopril (Zestril)  Magnesium (Mag-Ox)  Meclizine (Antivert)  Medroxyprogesterone (Depo-Provera)  Metformin (Glucophage)  Metoprolol (Lopressor)  Montelukast (Singulair)  Multivitamin  Naloxone (Narcan)  Pregabalin (Lyrica)  Probiotic  Quetiapine (Seroquel)  Rosuvastatin (Crestor)  Sertraline (Zoloft)  Spironolactone (Aldactone)  Tizanidine (Zanaflex)  Topical  Topical Diclofenac  Triamcinolone (Nasacort)  Vitamin B  Vitamin B12  Vitamin C  Vitamin D2  Zolpidem  (Ambien)  Zonisamide (Zonegran) ==================================================================== For clinical consultation, please call [phone]. ====================================================================      Laboratory Chemistry Profile   Renal Lab Results  Component Value Date   BUN 19 04/28/2021   CREATININE 1.06 (H) 04/28/2021   GFRAA >60 04/16/2020   GFRNONAA >60 04/28/2021    Hepatic Lab Results  Component Value Date   AST 34 04/22/2021   ALT 34 04/22/2021   ALBUMIN 4.1 04/22/2021   ALKPHOS 63 04/22/2021    Electrolytes Lab Results  Component Value Date   NA 134 (L) 04/28/2021   K 4.5 04/28/2021   CL 101 04/28/2021   CALCIUM 8.4 (L) 04/28/2021   MG 2.1 04/28/2021   PHOS 5.5 (H) 05/22/2019    Bone Lab Results  Component Value Date   VD25OH 27.4 (L) 11/24/2015   VD125OH2TOT 41.5 11/24/2015    Inflammation (CRP: Acute Phase) (ESR: Chronic Phase) Lab Results  Component Value Date   CRP 0.7 09/27/2019   ESRSEDRATE 30 (H) 09/27/2019         Note: Above Lab results reviewed.  Imaging  CT ABDOMEN PELVIS W CONTRAST CLINICAL DATA:  Right lower  quadrant abdominal pain. past few days she has been having frequent muscle spasms in her sides, legs and fingers. Pt states she had an iron infusion yesterday for chronically low iron. Pt denies abd pain, chest pain, or shortness of breath.  EXAM: CT ABDOMEN AND PELVIS WITH CONTRAST  TECHNIQUE: Multidetector CT imaging of the abdomen and pelvis was performed using the standard protocol following bolus administration of intravenous contrast.  CONTRAST:  137m OMNIPAQUE IOHEXOL 350 MG/ML SOLN  COMPARISON:  None.  FINDINGS: Lower chest: Subsegmental atelectasis.  Hepatobiliary: No focal liver abnormality. Status post cholecystectomy. No biliary dilatation.  Pancreas: No focal lesion. Normal pancreatic contour. No surrounding inflammatory changes. No main pancreatic ductal  dilatation.  Spleen: Normal in size without focal abnormality. Subcentimeter splenule noted.  Adrenals/Urinary Tract:  No adrenal nodule bilaterally.  Bilateral kidneys enhance symmetrically.  No hydronephrosis. No hydroureter.  The urinary bladder is unremarkable.  Stomach/Bowel: Surgical changes related to Roux-en-Y gastric bypass. Stomach is within normal limits. No evidence of bowel wall thickening or dilatation. Appendix appears normal.  Vascular/Lymphatic: Inferior vena cava filter with tip terminating just proximal to the renal veins confluence. No abdominal aorta or iliac aneurysm. Mild atherosclerotic plaque of the aorta and its branches. No abdominal, pelvic, or inguinal lymphadenopathy.  Reproductive: Uterus and bilateral adnexa are unremarkable.  Other: No intraperitoneal free fluid. No intraperitoneal free gas. No organized fluid collection.  Musculoskeletal:  Small to moderate volume supraumbilical ventral hernia containing fat.  No suspicious lytic or blastic osseous lesions. No acute displaced fracture. Multilevel degenerative changes of the spine. Bilateral total hip arthroplasties.  IMPRESSION: 1. No acute intra-abdominal or intrapelvic abnormality. 2. Small to moderate volume fat containing supraumbilical ventral wall hernia. No associated findings to suggest ischemia or bowel obstruction. 3. Other imaging findings of potential clinical significance:: Status post cholecystectomy. Status post Roux-en-Y gastric bypass. Inferior vena caval filter in appropriate position. Bilateral total hip arthroplasties. Aortic Atherosclerosis (ICD10-I70.0).  Electronically Signed   By: MIven FinnM.D.   On: 04/28/2021 23:30  Assessment  The primary encounter diagnosis was Chronic low back pain (3ry area of Pain) (Bilateral) (R>L) w/o sciatica. Diagnoses of DDD (degenerative disc disease), lumbosacral, Grade 1 (1.4 cm) Anterolisthesis of L4 over L5, Lumbar  facet syndrome (Bilateral) (R>L), Osteoarthritis of lumbar spine without myelopathy or radiculopathy, Spondylosis without myelopathy or radiculopathy, lumbosacral region, Chronic pain syndrome, Encounter for chronic pain management, and Class 3 severe obesity due to excess calories with serious comorbidity and body mass index (BMI) of 60.0 to 69.9 in adult ([facility] were also pertinent to this visit.  Plan of Care  Problem-specific:  No problem-specific Assessment & Plan notes found for this encounter.  Ms. LTARRI GUILFOILhas a current medication list which includes the following long-term medication(s): albuterol, clonazepam, diphenhydramine, furosemide, levothyroxine, lisinopril, magnesium oxide, metformin, metoprolol tartrate, montelukast, naloxone, oxycodone, [START ON 06/25/2021] oxycodone, [START ON 07/25/2021] oxycodone, pregabalin, rizatriptan, rizatriptan, spironolactone, and zonisamide.  Pharmacotherapy (Medications Ordered): No orders of the defined types were placed in this encounter.  Orders:  Orders Placed This Encounter  Procedures   LUMBAR FACET(MEDIAL BRANCH NERVE BLOCK) MBNB    Standing Status:   Future    Standing Expiration Date:   09/02/2021    Scheduling Instructions:     Procedure: Lumbar facet block (AKA.: Lumbosacral medial branch nerve block)     Side: Bilateral     Level: L3-4, L4-5, & L5-S1 Facets (L2, L3, L4, L5, & S1 Medial Branch Nerves)  Sedation: Patient's choice.     Timeframe: ASAA    Order Specific Question:   Where will this procedure be performed?    Answer:   [facility]    Follow-up plan:   Return for (Clinic) procedure: (B) L-FCT Blk, (Sed-anx).     Interventional Therapies  Risk  Complexity Considerations:   Estimated body mass index is 62.48 kg/m as calculated from the following:   Height as of this encounter: _0  (1.626 m).   Weight as of this encounter: 364 lb (165.1 kg). NOTE: NO RFA until BMI less or equal to 35.  (Gastric  bypass done on 06/03/2017) Iodine allergy  Contrast dye allergy  Shellfish allergy    Planned  Pending:   Palliative bilateral lumbar facet block #6    Under consideration:   Diagnostic bilateral genicular NB    Completed:   Therapeutic bilateral IA Zilretta knee injection x1 (02/19/2021)  Therapeutic bilateral IA Hyalgan knee injection x16 (06/24/2020)  Therapeutic bilateral THR (total hip replacements) (Dr. Rudene Christians) (Right: 11/27/2019) (Left: 07/29/2015)  Palliative/therapeutic bilateral lumbar facet MBB x5 (04/14/2021)  Therapeutic right IA hip injection x3 (07/07/2017)  Therapeutic left IA shoulder (glenohumeral joint) injection x1 (05/31/2017)    Therapeutic  Palliative (PRN) options:   Palliative bilateral knee injections  Palliative bilateral lumbar facet MBBs        Recent Visits Date Type Provider Dept  04/22/21 Office Visit Milinda Pointer, MD [facility]  04/14/21 Procedure visit Milinda Pointer, MD [facility]  Showing recent visits within past 90 days and meeting all other requirements Today's Visits Date Type Provider Dept  06/03/21 Office Visit Milinda Pointer, MD [facility]  Showing today's visits and meeting all other requirements Future Appointments Date Type Provider Dept  08/19/21 Appointment Milinda Pointer, MD [facility]  Showing future appointments within next 90 days and meeting all other requirements I discussed the assessment and treatment plan with the patient. The patient was provided an opportunity to ask questions and all were answered. The patient agreed with the plan and demonstrated an understanding of the instructions.  Patient advised to call back or seek an in-person evaluation if the symptoms or condition worsens.  Duration of encounter: 15 minutes.  Note by: Gaspar Cola, MD Date: 06/03/2021; Time: 9:58 AM

## 2021-06-03 ENCOUNTER — Ambulatory Visit: Payer: Medicare HMO | Attending: Pain Medicine | Admitting: Pain Medicine

## 2021-06-03 ENCOUNTER — Other Ambulatory Visit: Payer: Self-pay

## 2021-06-03 DIAGNOSIS — M47817 Spondylosis without myelopathy or radiculopathy, lumbosacral region: Secondary | ICD-10-CM | POA: Diagnosis not present

## 2021-06-03 DIAGNOSIS — Z6841 Body Mass Index (BMI) 40.0 and over, adult: Secondary | ICD-10-CM

## 2021-06-03 DIAGNOSIS — M47816 Spondylosis without myelopathy or radiculopathy, lumbar region: Secondary | ICD-10-CM

## 2021-06-03 DIAGNOSIS — M4310 Spondylolisthesis, site unspecified: Secondary | ICD-10-CM

## 2021-06-03 DIAGNOSIS — G894 Chronic pain syndrome: Secondary | ICD-10-CM | POA: Diagnosis not present

## 2021-06-03 DIAGNOSIS — M545 Low back pain, unspecified: Secondary | ICD-10-CM

## 2021-06-03 DIAGNOSIS — M5137 Other intervertebral disc degeneration, lumbosacral region: Secondary | ICD-10-CM

## 2021-06-03 DIAGNOSIS — G8929 Other chronic pain: Secondary | ICD-10-CM

## 2021-06-03 NOTE — Patient Instructions (Signed)
______________________________________________________________________  Preparing for Procedure with Sedation  NOTICE: Due to recent regulatory changes, starting on April 13, 2021, procedures requiring intravenous (IV) sedation will no longer be performed at the [facility].  These types of procedures are required to be performed at [facility] ambulatory surgery facility.  We are very sorry for the inconvenience.  Procedure appointments are limited to planned procedures: No Prescription Refills. No disability issues will be discussed. No medication changes will be discussed.  Instructions: Oral Intake: Do not eat or drink anything for at least 8 hours prior to your procedure. (Exception: Blood Pressure Medication. See below.) Transportation: A driver is required. You may not drive yourself after the procedure. Blood Pressure Medicine: Do not forget to take your blood pressure medicine with a sip of water the morning of the procedure. If your Diastolic (lower reading) is above 100 mmHg, elective cases will be cancelled/rescheduled. Blood thinners: These will need to be stopped for procedures. Notify our staff if you are taking any blood thinners. Depending on which one you take, there will be specific instructions on how and when to stop it. Diabetics on insulin: Notify the staff so that you can be scheduled 1st case in the morning. If your diabetes requires high dose insulin, take only  of your normal insulin dose the morning of the procedure and notify the staff that you have done so. Preventing infections: Shower with an antibacterial soap the morning of your procedure. Build-up your immune system: Take 1000 mg of Vitamin C with every meal (3 times a day) the day prior to your procedure. Antibiotics: Inform the staff if you have a condition or reason that requires you to take antibiotics before dental procedures. Pregnancy: If you are pregnant, call and cancel the procedure. Sickness: If  you have a cold, fever, or any active infections, call and cancel the procedure. Arrival: You must be in the facility at least 30 minutes prior to your scheduled procedure. Children: Do not bring children with you. Dress appropriately: Bring dark clothing that you would not mind if they get stained. Valuables: Do not bring any jewelry or valuables.  Reasons to call and reschedule or cancel your procedure: (Following these recommendations will minimize the risk of a serious complication.) Surgeries: Avoid having procedures within 2 weeks of any surgery. (Avoid for 2 weeks before or after any surgery). Flu Shots: Avoid having procedures within 2 weeks of a flu shots. (Avoid for 2 weeks before or after immunizations). Barium: Avoid having a procedure within 7-10 days after having had a radiological study involving the use of radiological contrast. (Myelograms, Barium swallow or enema study). Heart attacks: Avoid any elective procedures or surgeries for the initial 6 months after a "Myocardial Infarction" (Heart Attack). Blood thinners: It is imperative that you stop these medications before procedures. Let us know if you if you take any blood thinner.  Infection: Avoid procedures during or within two weeks of an infection (including chest colds or gastrointestinal problems). Symptoms associated with infections include: Localized redness, fever, chills, night sweats or profuse sweating, burning sensation when voiding, cough, congestion, stuffiness, runny nose, sore throat, diarrhea, nausea, vomiting, cold or Flu symptoms, recent or current infections. It is specially important if the infection is over the area that we intend to treat. Heart and lung problems: Symptoms that may suggest an active cardiopulmonary problem include: cough, chest pain, breathing difficulties or shortness of breath, dizziness, ankle swelling, uncontrolled high or unusually low blood pressure, and/or palpitations. If you are    experiencing any of these symptoms, cancel your procedure and contact your primary care physician for an evaluation.  Remember:  Regular Business hours are:  Monday to Thursday 8:00 AM to 4:00 PM  Provider's Schedule: Nathaneal Sommers, MD:  Procedure days: Tuesday and Thursday 7:30 AM to 4:00 PM  Bilal Lateef, MD:  Procedure days: Monday and Wednesday 7:30 AM to 4:00 PM ______________________________________________________________________  ____________________________________________________________________________________________  General Risks and Possible Complications  Patient Responsibilities: It is important that you read this as it is part of your informed consent. It is our duty to inform you of the risks and possible complications associated with treatments offered to you. It is your responsibility as a patient to read this and to ask questions about anything that is not clear or that you believe was not covered in this document.  Patient's Rights: You have the right to refuse treatment. You also have the right to change your mind, even after initially having agreed to have the treatment done. However, under this last option, if you wait until the last second to change your mind, you may be charged for the materials used up to that point.  Introduction: Medicine is not an exact science. Everything in Medicine, including the lack of treatment(s), carries the potential for danger, harm, or loss (which is by definition: Risk). In Medicine, a complication is a secondary problem, condition, or disease that can aggravate an already existing one. All treatments carry the risk of possible complications. The fact that a side effects or complications occurs, does not imply that the treatment was conducted incorrectly. It must be clearly understood that these can happen even when everything is done following the highest safety standards.  No treatment: You can choose not to proceed with the  proposed treatment alternative. The "PRO(s)" would include: avoiding the risk of complications associated with the therapy. The "CON(s)" would include: not getting any of the treatment benefits. These benefits fall under one of three categories: diagnostic; therapeutic; and/or palliative. Diagnostic benefits include: getting information which can ultimately lead to improvement of the disease or symptom(s). Therapeutic benefits are those associated with the successful treatment of the disease. Finally, palliative benefits are those related to the decrease of the primary symptoms, without necessarily curing the condition (example: decreasing the pain from a flare-up of a chronic condition, such as incurable terminal cancer).  General Risks and Complications: These are associated to most interventional treatments. They can occur alone, or in combination. They fall under one of the following six (6) categories: no benefit or worsening of symptoms; bleeding; infection; nerve damage; allergic reactions; and/or death. No benefits or worsening of symptoms: In Medicine there are no guarantees, only probabilities. No healthcare provider can ever guarantee that a medical treatment will work, they can only state the probability that it may. Furthermore, there is always the possibility that the condition may worsen, either directly, or indirectly, as a consequence of the treatment. Bleeding: This is more common if the patient is taking a blood thinner, either prescription or over the counter (example: Goody Powders, Fish oil, Aspirin, Garlic, etc.), or if suffering a condition associated with impaired coagulation (example: Hemophilia, cirrhosis of the liver, low platelet counts, etc.). However, even if you do not have one on these, it can still happen. If you have any of these conditions, or take one of these drugs, make sure to notify your treating physician. Infection: This is more common in patients with a compromised  immune system, either due to disease (example:   diabetes, cancer, human immunodeficiency virus [HIV], etc.), or due to medications or treatments (example: therapies used to treat cancer and rheumatological diseases). However, even if you do not have one on these, it can still happen. If you have any of these conditions, or take one of these drugs, make sure to notify your treating physician. Nerve Damage: This is more common when the treatment is an invasive one, but it can also happen with the use of medications, such as those used in the treatment of cancer. The damage can occur to small secondary nerves, or to large primary ones, such as those in the spinal cord and brain. This damage may be temporary or permanent and it may lead to impairments that can range from temporary numbness to permanent paralysis and/or brain death. Allergic Reactions: Any time a substance or material comes in contact with our body, there is the possibility of an allergic reaction. These can range from a mild skin rash (contact dermatitis) to a severe systemic reaction (anaphylactic reaction), which can result in death. Death: In general, any medical intervention can result in death, most of the time due to an unforeseen complication. ____________________________________________________________________________________________  

## 2021-06-09 ENCOUNTER — Ambulatory Visit: Payer: Medicare HMO | Admitting: Pain Medicine

## 2021-06-17 NOTE — Progress Notes (Signed)
PROVIDER NOTE: Information contained herein reflects review and annotations entered in association with encounter. Interpretation of such information and data should be left to medically-trained personnel. Information provided to patient can be located elsewhere in the medical record under "Patient Instructions". Document created using STT-dictation technology, any transcriptional errors that may result from process are unintentional.    Patient: Karen Lewis  Service Category: Procedure  Provider: Gaspar Cola, MD  DOB: July 23, 1971  DOS: 06/18/2021  Location: [facility]  MRN: [id number]  Setting: Ambulatory - outpatient  Referring Provider: Milinda Pointer, MD  Type: Established Patient  Specialty: Interventional Pain Management  PCP: Idelle Crouch, MD   Primary Reason for Visit: Interventional Pain Management Treatment. CC: Back Pain (Lower/)    Procedure:          Anesthesia, Analgesia, Anxiolysis:  Type: Lumbar Facet, Medial Branch Block(s)          Primary Purpose: Diagnostic Region: Posterolateral Lumbosacral Spine Level: L2, L3, L4, L5, & S1 Medial Branch Level(s). Injecting these levels blocks the L3-4, L4-5, and L5-S1 lumbar facet joints. Laterality: Bilateral  Type: Local Anesthesia Local Anesthetic: Lidocaine 1-2% Sedation: Minimal Anxiolysis  Indication(s): Anxiety & Analgesia Route: Infiltration (/IM) IV Access: Available   Position: Prone   Indications: 1. Lumbar facet syndrome (Bilateral) (R>L)   2. Grade 1 (1.4 cm) Anterolisthesis of L4 over L5   3. Spondylosis without myelopathy or radiculopathy, lumbosacral region   4. Osteoarthritis of lumbar spine without myelopathy or radiculopathy   5. DDD (degenerative disc disease), lumbosacral   6. Chronic low back pain (3ry area of Pain) (Bilateral) (R>L) w/o sciatica   7. Class 3 severe obesity due to excess calories with serious comorbidity and body mass index (BMI) of 60.0 to 69.9 in adult  [facility])    Pain Score: Pre-procedure: 5 /10 Post-procedure: 0-No pain/10     RTCB: 10/23/2021  Pre-op H&P Assessment:  Karen Lewis is a 50 y.o. (year old), female patient, seen today for interventional treatment. She  has a past surgical history that includes Laparoscopic partial gastrectomy; Shoulder arthroscopy (Right); Carpal tunnel release (Bilateral); Diagnostic laparoscopy; Cholecystectomy; Trigger finger release (Right); Thyroidectomy (N/A, 11/12/2015); left trigger finger; Roux-en-Y Gastric Bypass (06/03/2017); Hiatal hernia repair; peniculectomy (N/A, 07/05/2018); Total hip arthroplasty (Right, 11/27/2019); Joint replacement (Bilateral, hip); Appendectomy; Trigger finger release (Right, 04/24/2020); Colonoscopy with propofol (N/A, 02/11/2021); and Esophagogastroduodenoscopy (egd) with propofol (N/A, 02/11/2021). Karen Lewis has a current medication list which includes the following prescription(s): accu-chek aviva plus, acetaminophen, acidophilus, albuterol, amitriptyline, vitamin c, benzonatate, biotin, butalbital-acetaminophen-caffeine, calcium carbonate, clonazepam, b-12, diclofenac sodium, diphenhydramine, dupixent, emgality, epinephrine, ergocalciferol, famotidine, fluticasone furoate-vilanterol, furosemide, accu-chek aviva plus, guaifenesin-codeine, levocetirizine, magnesium oxide, meclizine, metformin, metoprolol tartrate, montelukast, tab-a-vite, naloxone, naphazoline-pheniramine, nitrofurantoin (macrocrystal-monohydrate), omeprazole, [START ON 06/25/2021] oxycodone, [START ON 07/25/2021] oxycodone, [START ON 08/24/2021] oxycodone, [START ON 09/23/2021] oxycodone, ozempic (0.25 or 0.5 mg/dose), quetiapine, rizatriptan, rizatriptan, rosuvastatin, sertraline, spironolactone, tizanidine, topiramate, zolpidem, levothyroxine, lisinopril, pregabalin, and zonisamide. Her primarily concern today is the Back Pain (Lower/)  Initial Vital Signs:  Pulse/HCG Rate: 84ECG Heart Rate: 95 Temp: (!) 97.1 F (36.2  C) Resp: 16 BP: 117/73 SpO2: 100 %  BMI: Estimated body mass index is 60.59 kg/m as calculated from the following:   Height as of this encounter: 5\' 4"  (1.626 m).   Weight as of this encounter: 353 lb (160.1 kg).  Risk Assessment: Allergies: Reviewed. She is allergic to bactrim [sulfamethoxazole-trimethoprim], omalizumab, ciprofloxacin, shellfish allergy, and nsaids.  Allergy Precautions: None required Coagulopathies:  Reviewed. None identified.  Blood-thinner therapy: None at this time Active Infection(s): Reviewed. None identified. Ms. Stradley is afebrile  Site Confirmation: Ms. Sawchuk was asked to confirm the procedure and laterality before marking the site Procedure checklist: Completed Consent: Before the procedure and under the influence of no sedative(s), amnesic(s), or anxiolytics, the patient was informed of the treatment options, risks and possible complications. To fulfill our ethical and legal obligations, as recommended by the American Medical Association's Code of Ethics, I have informed the patient of my clinical impression; the nature and purpose of the treatment or procedure; the risks, benefits, and possible complications of the intervention; the alternatives, including doing nothing; the risk(s) and benefit(s) of the alternative treatment(s) or procedure(s); and the risk(s) and benefit(s) of doing nothing. The patient was provided information about the general risks and possible complications associated with the procedure. These may include, but are not limited to: failure to achieve desired goals, infection, bleeding, organ or nerve damage, allergic reactions, paralysis, and death. In addition, the patient was informed of those risks and complications associated to Spine-related procedures, such as failure to decrease pain; infection (i.e.: Meningitis, epidural or intraspinal abscess); bleeding (i.e.: epidural hematoma, subarachnoid hemorrhage, or any other type of intraspinal or  peri-dural bleeding); organ or nerve damage (i.e.: Any type of peripheral nerve, nerve root, or spinal cord injury) with subsequent damage to sensory, motor, and/or autonomic systems, resulting in permanent pain, numbness, and/or weakness of one or several areas of the body; allergic reactions; (i.e.: anaphylactic reaction); and/or death. Furthermore, the patient was informed of those risks and complications associated with the medications. These include, but are not limited to: allergic reactions (i.e.: anaphylactic or anaphylactoid reaction(s)); adrenal axis suppression; blood sugar elevation that in diabetics may result in ketoacidosis or comma; water retention that in patients with history of congestive heart failure may result in shortness of breath, pulmonary edema, and decompensation with resultant heart failure; weight gain; swelling or edema; medication-induced neural toxicity; particulate matter embolism and blood vessel occlusion with resultant organ, and/or nervous system infarction; and/or aseptic necrosis of one or more joints. Finally, the patient was informed that Medicine is not an exact science; therefore, there is also the possibility of unforeseen or unpredictable risks and/or possible complications that may result in a catastrophic outcome. The patient indicated having understood very clearly. We have given the patient no guarantees and we have made no promises. Enough time was given to the patient to ask questions, all of which were answered to the patient's satisfaction. Ms. Kniskern has indicated that she wanted to continue with the procedure. Attestation: I, the ordering provider, attest that I have discussed with the patient the benefits, risks, side-effects, alternatives, likelihood of achieving goals, and potential problems during recovery for the procedure that I have provided informed consent. Date  Time: 06/18/2021  8:50 AM  Pre-Procedure Preparation:  Monitoring: As per clinic  protocol. Respiration, ETCO2, SpO2, BP, heart rate and rhythm monitor placed and checked for adequate function Safety Precautions: Patient was assessed for positional comfort and pressure points before starting the procedure. Time-out: I initiated and conducted the "Time-out" before starting the procedure, as per protocol. The patient was asked to participate by confirming the accuracy of the "Time Out" information. Verification of the correct person, site, and procedure were performed and confirmed by me, the nursing staff, and the patient. "Time-out" conducted as per Joint Commission's Universal Protocol (UP.01.01.01). Time: 9509  Description of Procedure:  Laterality: Bilateral. The procedure was performed in identical fashion on both sides. Levels:  L2, L3, L4, L5, & S1 Medial Branch Level(s) Area Prepped: Posterior Lumbosacral Region DuraPrep (Iodine Povacrylex [0.7% available iodine] and Isopropyl Alcohol, 74% w/w) Safety Precautions: Aspiration looking for blood return was conducted prior to all injections. At no point did we inject any substances, as a needle was being advanced. Before injecting, the patient was told to immediately notify me if she was experiencing any new onset of "ringing in the ears, or metallic taste in the mouth". No attempts were made at seeking any paresthesias. Safe injection practices and needle disposal techniques used. Medications properly checked for expiration dates. SDV (single dose vial) medications used. After the completion of the procedure, all disposable equipment used was discarded in the proper designated medical waste containers. Local Anesthesia: Protocol guidelines were followed. The patient was positioned over the fluoroscopy table. The area was prepped in the usual manner. The time-out was completed. The target area was identified using fluoroscopy. A 12-in long, straight, sterile hemostat was used with fluoroscopic guidance to locate the targets  for each level blocked. Once located, the skin was marked with an approved surgical skin marker. Once all sites were marked, the skin (epidermis, dermis, and hypodermis), as well as deeper tissues (fat, connective tissue and muscle) were infiltrated with a small amount of a short-acting local anesthetic, loaded on a 10cc syringe with a 25G, 1.5-in  Needle. An appropriate amount of time was allowed for local anesthetics to take effect before proceeding to the next step. Local Anesthetic: Lidocaine 2.0% The unused portion of the local anesthetic was discarded in the proper designated containers. Technical explanation of process:  L2 Medial Branch Nerve Block (MBB): The target area for the L2 medial branch is at the junction of the postero-lateral aspect of the superior articular process and the superior, posterior, and medial edge of the transverse process of L3. Under fluoroscopic guidance, a Quincke needle was inserted until contact was made with os over the superior postero-lateral aspect of the pedicular shadow (target area). After negative aspiration for blood, 0.5 mL of the nerve block solution was injected without difficulty or complication. The needle was removed intact. L3 Medial Branch Nerve Block (MBB): The target area for the L3 medial branch is at the junction of the postero-lateral aspect of the superior articular process and the superior, posterior, and medial edge of the transverse process of L4. Under fluoroscopic guidance, a Quincke needle was inserted until contact was made with os over the superior postero-lateral aspect of the pedicular shadow (target area). After negative aspiration for blood, 0.5 mL of the nerve block solution was injected without difficulty or complication. The needle was removed intact. L4 Medial Branch Nerve Block (MBB): The target area for the L4 medial branch is at the junction of the postero-lateral aspect of the superior articular process and the superior, posterior,  and medial edge of the transverse process of L5. Under fluoroscopic guidance, a Quincke needle was inserted until contact was made with os over the superior postero-lateral aspect of the pedicular shadow (target area). After negative aspiration for blood, 0.5 mL of the nerve block solution was injected without difficulty or complication. The needle was removed intact. L5 Medial Branch Nerve Block (MBB): The target area for the L5 medial branch is at the junction of the postero-lateral aspect of the superior articular process and the superior, posterior, and medial edge of the sacral ala. Under fluoroscopic guidance, a Quincke needle  was inserted until contact was made with os over the superior postero-lateral aspect of the pedicular shadow (target area). After negative aspiration for blood, 0.5 mL of the nerve block solution was injected without difficulty or complication. The needle was removed intact. S1 Medial Branch Nerve Block (MBB): The target area for the S1 medial branch is at the posterior and inferior 6 o'clock position of the L5-S1 facet joint. Under fluoroscopic guidance, the Quincke needle inserted for the L5 MBB was redirected until contact was made with os over the inferior and postero aspect of the sacrum, at the 6 o' clock position under the L5-S1 facet joint (Target area). After negative aspiration for blood, 0.5 mL of the nerve block solution was injected without difficulty or complication. The needle was removed intact.  Nerve block solution: 0.2% PF-Ropivacaine + Triamcinolone (40 mg/mL) diluted to a final concentration of 4 mg of Triamcinolone/mL of Ropivacaine The unused portion of the solution was discarded in the proper designated containers. Procedural Needles: 22-gauge, 7-inch, Quincke needles used for all levels.  Once the entire procedure was completed, the treated area was cleaned, making sure to leave some of the prepping solution back to take advantage of its long term  bactericidal properties.      Illustration of the posterior view of the lumbar spine and the posterior neural structures. Laminae of L2 through S1 are labeled. DPRL5, dorsal primary ramus of L5; DPRS1, dorsal primary ramus of S1; DPR3, dorsal primary ramus of L3; FJ, facet (zygapophyseal) joint L3-L4; I, inferior articular process of L4; LB1, lateral branch of dorsal primary ramus of L1; IAB, inferior articular branches from L3 medial branch (supplies L4-L5 facet joint); IBP, intermediate branch plexus; MB3, medial branch of dorsal primary ramus of L3; NR3, third lumbar nerve root; S, superior articular process of L5; SAB, superior articular branches from L4 (supplies L4-5 facet joint also); TP3, transverse process of L3.  Vitals:   06/18/21 0950 06/18/21 0955 06/18/21 1005 06/18/21 1018  BP: 97/76 102/64 102/67 104/65  Pulse:    80  Resp: 15 13  14   Temp:  (!) 97 F (36.1 C)  (!) 97.3 F (36.3 C)  TempSrc:      SpO2: 98% 97% 98% 98%  Weight:      Height:         Start Time: 0936 hrs. End Time: 0949 hrs.  Imaging Guidance (Spinal):          Type of Imaging Technique: Fluoroscopy Guidance (Spinal) Indication(s): Assistance in needle guidance and placement for procedures requiring needle placement in or near specific anatomical locations not easily accessible without such assistance. Exposure Time: Please see nurses notes. Contrast: None used. Fluoroscopic Guidance: I was personally present during the use of fluoroscopy. "Tunnel Vision Technique" used to obtain the best possible view of the target area. Parallax error corrected before commencing the procedure. "Direction-depth-direction" technique used to introduce the needle under continuous pulsed fluoroscopy. Once target was reached, antero-posterior, oblique, and lateral fluoroscopic projection used confirm needle placement in all planes. Images permanently stored in EMR. Interpretation: No contrast injected. I personally interpreted  the imaging intraoperatively. Adequate needle placement confirmed in multiple planes. Permanent images saved into the patient's record.  Antibiotic Prophylaxis:   Anti-infectives (From admission, onward)    None      Indication(s): None identified  Post-operative Assessment:  Post-procedure Vital Signs:  Pulse/HCG Rate: 8080 Temp: (!) 97.3 F (36.3 C) Resp: 14 BP: 104/65 SpO2: 98 %  EBL: None  Complications:  No immediate post-treatment complications observed by team, or reported by patient.  Note: The patient tolerated the entire procedure well. A repeat set of vitals were taken after the procedure and the patient was kept under observation following institutional policy, for this type of procedure. Post-procedural neurological assessment was performed, showing return to baseline, prior to discharge. The patient was provided with post-procedure discharge instructions, including a section on how to identify potential problems. Should any problems arise concerning this procedure, the patient was given instructions to immediately contact us, at any time, without hesitation. In any case, we plan to contact the patient by telephone for a follow-up status report regarding this interventional procedure.  Comments:  No additional relevant information.  Plan of Care  Orders:  Orders Placed This Encounter  Procedures   LUMBAR FACET(MEDIAL BRANCH NERVE BLOCK) MBNB    Scheduling Instructions:     Procedure: Lumbar facet block (AKA.: Lumbosacral medial branch nerve block)     Side: Bilateral     Level: L3-4, L4-5, & L5-S1 Facets (L2, L3, L4, L5, & S1 Medial Branch Nerves)     Sedation: Patient's choice.     Timeframe: Today    Order Specific Question:   Where will this procedure be performed?    Answer:   [facility]   DG PAIN [facility] C-ARM 1-60 MIN NO REPORT    Intraoperative interpretation by procedural physician at [facility].    Standing Status:   Standing     Number of Occurrences:   1    Order Specific Question:   Reason for exam:    Answer:   Assistance in needle guidance and placement for procedures requiring needle placement in or near specific anatomical locations not easily accessible without such assistance.   Informed Consent Details: Physician/Practitioner Attestation; Transcribe to consent form and obtain patient signature    Nursing Order: Transcribe to consent form and obtain patient signature. Note: Always confirm laterality of pain with Ms. Brigitte Pulse, before procedure.    Order Specific Question:   Physician/Practitioner attestation of informed consent for procedure/surgical case    Answer:   I, the physician/practitioner, attest that I have discussed with the patient the benefits, risks, side effects, alternatives, likelihood of achieving goals and potential problems during recovery for the procedure that I have provided informed consent.    Order Specific Question:   Procedure    Answer:   Lumbar Facet Block  under fluoroscopic guidance    Order Specific Question:   Physician/Practitioner performing the procedure    Answer:   Cadence Haslam A. [name] MD    Order Specific Question:   Indication/Reason    Answer:   Low Back Pain, with our without leg pain, due to Facet Joint Arthralgia (Joint Pain) Spondylosis (Arthritis of the Spine), without myelopathy or radiculopathy (Nerve Damage).   Provide equipment / supplies at bedside    "Block Tray" (Disposable  single use) Needle type: SpinalSpinal Amount/quantity: 4 Size: Long (7-inch) Gauge: 22G    Standing Status:   Standing    Number of Occurrences:   1    Order Specific Question:   Specify    Answer:   Block Tray   Miscellanous precautions    NOTE: Although It is true that patients can have allergies to shellfish and that shellfish contain iodine, most shellfish  allergies are due to two protein allergens present in the shellfish: tropomyosins and parvalbumin. Not all patients with  shellfish allergies are allergic to iodine. However, as a  precaution, avoid using iodine containing products.    Standing Status:   Standing    Number of Occurrences:   1   Miscellanous precautions    Standing Status:   Standing    Number of Occurrences:   1    Chronic Opioid Analgesic:  Oxycodone IR 5 mg 1 tab PO 5X/day (#150/mo) (25 mg/day) MME/day: 37.5 mg/day.   Medications ordered for procedure: Meds ordered this encounter  Medications   lidocaine (XYLOCAINE) 2 % (with pres) injection 400 mg   lactated ringers infusion 1,000 mL   midazolam (VERSED) 5 MG/5ML injection 0.5-2 mg    Make sure Flumazenil is available in the pyxis when using this medication. If oversedation occurs, administer 0.2 mg IV over 15 sec. If after 45 sec no response, administer 0.2 mg again over 1 min; may repeat at 1 min intervals; not to exceed 4 doses (1 mg)   ropivacaine (PF) 2 mg/mL (0.2%) (NAROPIN) injection 18 mL   triamcinolone acetonide (KENALOG-40) injection 80 mg   oxyCODONE (OXY IR/ROXICODONE) 5 MG immediate release tablet    Sig: Take 1 tablet (5 mg total) by mouth 5 (five) times daily. Must last 30 days    Dispense:  150 tablet    Refill:  0    Not a duplicate. Do NOT delete! Dispense 1 day early if closed on refill date. Avoid benzodiazepines within 8 hours of opioids. Do not send refill requests.   oxyCODONE (OXY IR/ROXICODONE) 5 MG immediate release tablet    Sig: Take 1 tablet (5 mg total) by mouth 5 (five) times daily. Must last 30 days    Dispense:  150 tablet    Refill:  0    Not a duplicate. Do NOT delete! Dispense 1 day early if closed on refill date. Avoid benzodiazepines within 8 hours of opioids. Do not send refill requests.    Medications administered: We administered lidocaine, lactated ringers, midazolam, ropivacaine (PF) 2 mg/mL (0.2%), and triamcinolone acetonide.  See the medical record for exact dosing, route, and time of administration.  Follow-up plan:   Return in about  2 weeks (around 07/02/2021) for Proc-day (T,Th), (VV), (PPE).       Interventional Therapies  Risk  Complexity Considerations:   Estimated body mass index is 62.48 kg/m as calculated from the following:   Height as of this encounter: 5\' 4"  (1.626 m).   Weight as of this encounter: 364 lb (165.1 kg). NOTE: NO RFA until BMI less or equal to 35.  (Gastric bypass done on 06/03/2017) Iodine allergy  Contrast dye allergy  Shellfish allergy    Planned  Pending:   Palliative bilateral lumbar facet block #6    Under consideration:   Diagnostic bilateral genicular NB    Completed:   Therapeutic bilateral IA Zilretta knee injection x1 (02/19/2021)  Therapeutic bilateral IA Hyalgan knee injection x16 (06/24/2020)  Therapeutic bilateral THR (total hip replacements) (Dr. Rudene Christians) (Right: 11/27/2019) (Left: 07/29/2015)  Palliative/therapeutic bilateral lumbar facet MBB x5 (04/14/2021)  Therapeutic right IA hip injection x3 (07/07/2017)  Therapeutic left IA shoulder (glenohumeral joint) injection x1 (05/31/2017)    Therapeutic  Palliative (PRN) options:   Palliative bilateral knee injections  Palliative bilateral lumbar facet MBBs         Recent Visits Date Type Provider Dept  06/03/21 Office Visit Milinda Pointer, MD [facility]  04/22/21 Office Visit Milinda Pointer, MD [facility]  04/14/21 Procedure visit Milinda Pointer, MD [facility]  Showing recent visits within  past 90 days and meeting all other requirements Today's Visits Date Type Provider Dept  06/18/21 Procedure visit Milinda Pointer, MD [facility]  Showing today's visits and meeting all other requirements Future Appointments Date Type Provider Dept  07/02/21 Appointment Milinda Pointer, MD [facility]  Showing future appointments within next 90 days and meeting all other requirements Disposition: Discharge home  Discharge (Date  Time): 06/18/2021; 1019 hrs.    Primary Care Physician: Idelle Crouch, MD Location: [facility] Note by: Gaspar Cola, MD Date: 06/18/2021; Time: 2:07 PM  Disclaimer:  Medicine is not an Chief Strategy Officer. The only guarantee in medicine is that nothing is guaranteed. It is important to note that the decision to proceed with this intervention was based on the information collected from the patient. The Data and conclusions were drawn from the patient's questionnaire, the interview, and the physical examination. Because the information was provided in large part by the patient, it cannot be guaranteed that it has not been purposely or unconsciously manipulated. Every effort has been made to obtain as much relevant data as possible for this evaluation. It is important to note that the conclusions that lead to this procedure are derived in large part from the available data. Always take into account that the treatment will also be dependent on availability of resources and existing treatment guidelines, considered by other Pain Management Practitioners as being common knowledge and practice, at the time of the intervention. For Medico-Legal purposes, it is also important to point out that variation in procedural techniques and pharmacological choices are the acceptable norm. The indications, contraindications, technique, and results of the above procedure should only be interpreted and judged by a Board-Certified Interventional Pain Specialist with extensive familiarity and expertise in the same exact procedure and technique.

## 2021-06-18 ENCOUNTER — Other Ambulatory Visit: Payer: Self-pay

## 2021-06-18 ENCOUNTER — Ambulatory Visit
Admission: RE | Admit: 2021-06-18 | Discharge: 2021-06-18 | Disposition: A | Discharge status: Home / Self Care | Payer: Medicare HMO | Source: Ambulatory Visit | Attending: Pain Medicine | Admitting: Pain Medicine

## 2021-06-18 ENCOUNTER — Encounter: Payer: Self-pay | Admitting: Pain Medicine

## 2021-06-18 ENCOUNTER — Ambulatory Visit (HOSPITAL_BASED_OUTPATIENT_CLINIC_OR_DEPARTMENT_OTHER): Payer: Medicare HMO | Admitting: Pain Medicine

## 2021-06-18 VITALS — BP 104/65 | HR 80 | Temp 97.3°F | Resp 14 | Ht 64.0 in | Wt 353.0 lb

## 2021-06-18 DIAGNOSIS — Z79891 Long term (current) use of opiate analgesic: Secondary | ICD-10-CM | POA: Insufficient documentation

## 2021-06-18 DIAGNOSIS — Z79899 Other long term (current) drug therapy: Secondary | ICD-10-CM | POA: Insufficient documentation

## 2021-06-18 DIAGNOSIS — G894 Chronic pain syndrome: Secondary | ICD-10-CM

## 2021-06-18 DIAGNOSIS — M25561 Pain in right knee: Secondary | ICD-10-CM | POA: Insufficient documentation

## 2021-06-18 DIAGNOSIS — M25562 Pain in left knee: Secondary | ICD-10-CM | POA: Insufficient documentation

## 2021-06-18 DIAGNOSIS — Z6841 Body Mass Index (BMI) 40.0 and over, adult: Secondary | ICD-10-CM | POA: Insufficient documentation

## 2021-06-18 DIAGNOSIS — M4310 Spondylolisthesis, site unspecified: Secondary | ICD-10-CM | POA: Insufficient documentation

## 2021-06-18 DIAGNOSIS — M47816 Spondylosis without myelopathy or radiculopathy, lumbar region: Secondary | ICD-10-CM | POA: Diagnosis not present

## 2021-06-18 DIAGNOSIS — M545 Low back pain, unspecified: Secondary | ICD-10-CM | POA: Diagnosis not present

## 2021-06-18 DIAGNOSIS — M5137 Other intervertebral disc degeneration, lumbosacral region: Secondary | ICD-10-CM | POA: Diagnosis not present

## 2021-06-18 DIAGNOSIS — M25551 Pain in right hip: Secondary | ICD-10-CM | POA: Diagnosis present

## 2021-06-18 DIAGNOSIS — G8929 Other chronic pain: Secondary | ICD-10-CM | POA: Insufficient documentation

## 2021-06-18 DIAGNOSIS — M47817 Spondylosis without myelopathy or radiculopathy, lumbosacral region: Secondary | ICD-10-CM | POA: Diagnosis not present

## 2021-06-18 MED ORDER — OXYCODONE HCL 5 MG PO TABS: 5.0000 mg | ORAL_TABLET | Freq: Every day | ORAL | 0 refills | Status: DC

## 2021-06-18 MED ORDER — LACTATED RINGERS IV SOLN
1000.0000 mL | Freq: Once | INTRAVENOUS | Status: AC
  Administered 2021-06-18: 1000 mL via INTRAVENOUS

## 2021-06-18 MED ORDER — MIDAZOLAM HCL 5 MG/5ML IJ SOLN
0.5000 mg | Freq: Once | INTRAMUSCULAR | Status: AC
  Administered 2021-06-18: 2 mg via INTRAVENOUS
  Filled 2021-06-18: qty 5

## 2021-06-18 MED ORDER — TRIAMCINOLONE ACETONIDE 40 MG/ML IJ SUSP
80.0000 mg | Freq: Once | INTRAMUSCULAR | Status: AC
  Administered 2021-06-18: 40 mg
  Filled 2021-06-18: qty 2

## 2021-06-18 MED ORDER — ROPIVACAINE HCL 2 MG/ML IJ SOLN
INTRAMUSCULAR | Status: AC
  Filled 2021-06-18: qty 20

## 2021-06-18 MED ORDER — LIDOCAINE HCL 2 % IJ SOLN
20.0000 mL | Freq: Once | INTRAMUSCULAR | Status: AC
  Administered 2021-06-18: 400 mg
  Filled 2021-06-18: qty 20

## 2021-06-18 MED ORDER — ROPIVACAINE HCL 2 MG/ML IJ SOLN
18.0000 mL | Freq: Once | INTRAMUSCULAR | Status: AC
  Administered 2021-06-18: 20 mL via PERINEURAL

## 2021-06-18 NOTE — Patient Outreach (Signed)
[facility]) Care Management  [place]  06/18/2021   Karen Lewis 22-May-1971 [id number]  Subjective: Telephone call to patient for follow up.  Patient reports doing well.  Diabetes management continues.   Objective:   Encounter Medications:  Outpatient Encounter Medications as of 06/18/2021  Medication Sig Note   ACCU-CHEK AVIVA PLUS test strip     acetaminophen (TYLENOL) 650 MG CR tablet Take 650-1,300 mg by mouth every 8 (eight) hours as needed for pain.    acidophilus (RISAQUAD) CAPS capsule Take 1 capsule by mouth daily.    albuterol (PROVENTIL HFA;VENTOLIN HFA) 108 (90 BASE) MCG/ACT inhaler Inhale 2 puffs into the lungs every 6 (six) hours as needed for wheezing or shortness of breath.    amitriptyline (ELAVIL) 100 MG tablet Take 50 mg by mouth at bedtime.    Ascorbic Acid (VITAMIN C) 1000 MG tablet Take 1,000 mg by mouth daily.     benzonatate (TESSALON) 100 MG capsule Take 100 mg by mouth.    Biotin 10 MG CAPS Take 10 mg by mouth daily.     butalbital-acetaminophen-caffeine (FIORICET) 50-325-40 MG tablet Take 1 tablet by mouth every 6 (six) hours as needed for migraine.     calcium carbonate (TUMS EX) 750 MG chewable tablet Chew 1 tablet by mouth daily.     clonazePAM (KLONOPIN) 0.5 MG tablet Take 1 tablet (0.5 mg total) by mouth 3 (three) times daily as needed for anxiety.    Cyanocobalamin (B-12) 2500 MCG TABS Take 2,500 mcg by mouth daily.    diclofenac sodium (VOLTAREN) 1 % GEL Apply 2 g topically 4 (four) times daily. (Patient taking differently: Apply 2 g topically 3 (three) times daily as needed (pain).)    diphenhydrAMINE (BENADRYL) 25 MG tablet Take 25 mg by mouth every 8 (eight) hours as needed for itching or allergies.     DUPIXENT 300 MG/2ML SOPN     EMGALITY 120 MG/ML SOAJ Inject into the skin.    EPINEPHrine 0.3 mg/0.3 mL IJ SOAJ injection Inject 0.3 mg into the muscle as needed for anaphylaxis.     ergocalciferol (VITAMIN D2) 1.25 MG  (50000 UT) capsule Take 50,000 Units by mouth 2 (two) times a week.     famotidine (PEPCID) 20 MG tablet Take 20 mg by mouth at bedtime.    fluticasone furoate-vilanterol (BREO ELLIPTA) 200-25 MCG/INH AEPB Inhale 1 puff into the lungs daily.    furosemide (LASIX) 20 MG tablet Take 20 mg by mouth daily as needed.    glucose blood (ACCU-CHEK AVIVA PLUS) test strip Use 2 (two) times daily    guaiFENesin-codeine (ROBITUSSIN AC) 100-10 MG/5ML syrup Take by mouth.    levocetirizine (XYZAL) 5 MG tablet Take 5 mg by mouth at bedtime.     levothyroxine (SYNTHROID) 125 MCG tablet Take 250 mcg by mouth daily before breakfast.     lisinopril (ZESTRIL) 5 MG tablet Take 5 mg by mouth at bedtime.     magnesium oxide (MAG-OX) 400 MG tablet Take 1,200 mg by mouth 2 (two) times daily. Take 1200 mg by mouth in the morning and 1200 mg at bedtime    meclizine (ANTIVERT) 25 MG tablet Take 25 mg by mouth 3 (three) times daily as needed for dizziness.     metFORMIN (GLUCOPHAGE) 1000 MG tablet Take 1,000 mg by mouth 2 (two) times daily with a meal.     metoprolol tartrate (LOPRESSOR) 50 MG tablet Take 75 mg by mouth 2 (two) times daily.  montelukast (SINGULAIR) 10 MG tablet Take 10 mg by mouth at bedtime.     Multiple Vitamin (TAB-A-VITE) TABS     naloxone (NARCAN) nasal spray 4 mg/0.1 mL Place 1 spray into the nose as needed for up to 365 doses (for opioid-induced respiratory depresssion). In case of emergency (overdose), spray once into each nostril. If no response within 3 minutes, repeat application and call [phone].    naphazoline-pheniramine (NAPHCON-A) 0.025-0.3 % ophthalmic solution Place 1 drop into both eyes 4 (four) times daily as needed for eye irritation.    nitrofurantoin, macrocrystal-monohydrate, (MACROBID) 100 MG capsule Take 1 capsule (100 mg total) by mouth daily.    omeprazole (PRILOSEC) 40 MG capsule Take 40 mg by mouth in the morning and at bedtime.    [START ON 06/25/2021] oxyCODONE (OXY  IR/ROXICODONE) 5 MG immediate release tablet Take 1 tablet (5 mg total) by mouth 5 (five) times daily. Must last 30 days    [START ON 07/25/2021] oxyCODONE (OXY IR/ROXICODONE) 5 MG immediate release tablet Take 1 tablet (5 mg total) by mouth 5 (five) times daily. Must last 30 days 04/22/2021: WARNING: Not a Duplicate. Future prescription. DO NOT DELETE during hospital admission medication reconciliation or at discharge. [facility] Patient    [START ON 08/24/2021] oxyCODONE (OXY IR/ROXICODONE) 5 MG immediate release tablet Take 1 tablet (5 mg total) by mouth 5 (five) times daily. Must last 30 days    [START ON 09/23/2021] oxyCODONE (OXY IR/ROXICODONE) 5 MG immediate release tablet Take 1 tablet (5 mg total) by mouth 5 (five) times daily. Must last 30 days    OZEMPIC, 0.25 OR 0.5 MG/DOSE, 2 MG/1.5ML SOPN Inject 1 mg into the skin.    pregabalin (LYRICA) 225 MG capsule Take 1 capsule (225 mg total) by mouth 2 (two) times daily.    QUEtiapine (SEROQUEL) 100 MG tablet Take 100 mg by mouth at bedtime.    rizatriptan (MAXALT) 10 MG tablet Take 1 and may repeat in 2 hours for MIgraines, alternate with the disintrgrating tablet.    rizatriptan (MAXALT-MLT) 10 MG disintegrating tablet Take 10 mg by mouth. Take 1 and may repeat in 2 hours  for Migraines    rosuvastatin (CRESTOR) 40 MG tablet Take 40 mg by mouth daily.     sertraline (ZOLOFT) 100 MG tablet Take 200 mg by mouth daily.     spironolactone (ALDACTONE) 25 MG tablet Take 37.5 mg by mouth 2 (two) times daily.     tiZANidine (ZANAFLEX) 4 MG tablet Take 1 tablet (4 mg total) by mouth 2 (two) times daily as needed for muscle spasms.    topiramate (TOPAMAX) 100 MG tablet Take 200 mg by mouth daily. Nightly    zolpidem (AMBIEN) 10 MG tablet Take 10 mg by mouth at bedtime as needed for sleep.    zonisamide (ZONEGRAN) 50 MG capsule Take 150 mg by mouth at bedtime.     Facility-Administered Encounter Medications as of 06/18/2021  Medication    lactated ringers infusion 1,000 mL   lidocaine (XYLOCAINE) 2 % (with pres) injection 400 mg   midazolam (VERSED) 5 MG/5ML injection 0.5-2 mg   ropivacaine (PF) 2 mg/mL (0.2%) (NAROPIN) injection 18 mL   triamcinolone acetonide (KENALOG-40) injection 80 mg    Functional Status:  In your present state of health, do you have any difficulty performing the following activities: 09/30/2020  Hearing? N  Vision? N  Difficulty concentrating or making decisions? N  Walking or climbing stairs? Y  Comment recent right  hip replacement  Dressing or bathing? Y  Comment recent right hip replacement  Doing errands, shopping? Y  Comment recent hip replacement- fiance helps  Preparing Food and eating ? Y  Comment fiance helps  Using the Toilet? N  In the past six months, have you accidently leaked urine? N  Do you have problems with loss of bowel control? N  Managing your Medications? N  Managing your Finances? N  Housekeeping or managing your Housekeeping? Y  Comment fiance helps  Some recent data might be hidden    Fall/Depression Screening: Fall Risk  06/18/2021 06/18/2021 04/22/2021  Falls in the past year? 0 0 0  Number falls in past yr: - - -  Comment - - -  Injury with Fall? - - -  Comment - - -  Risk Factor Category  - - -  Risk for fall due to : - - -  Risk for fall due to: Comment - - -  Follow up - - -   PHQ 2/9 Scores 06/18/2021 02/19/2021 01/21/2021 09/25/2020 04/17/2020 04/08/2020 03/13/2020  PHQ - 2 Score 0 0 0 0 0 0 0  PHQ- 9 Score - - - - - - -  Exception Documentation - - - - - - -    Assessment:   Care Plan Care Plan : Diabetes Type 2 (Adult)  Updates made by Jon Billings, RN since 06/18/2021 12:00 AM     Problem: Disease Progression (Diabetes, Type 2)      Long-Range Goal: Disease Progression Prevented or Minimized as evidneced by A1c less than 8.0   Start Date: 09/30/2020  Expected End Date: 03/12/2022  This Visit's Progress: On track  Recent Progress: On track   Priority: High  Note:   Evidence-based guidance:  Prepare patient for laboratory and diagnostic exams based on risk and presentation.  Encourage lifestyle changes, such as increased intake of plant-based foods, stress reduction, consistent physical activity and smoking cessation to prevent long-term complications and chronic disease.   Individualize activity and exercise recommendations while considering potential limitations, such as neuropathy, retinopathy or the ability to prevent hyperglycemia or hypoglycemia.   Prepare patient for use of pharmacologic therapy that may include antihypertensive, analgesic, prostaglandin E1 with periodic adjustments, based on presenting chronic condition and laboratory results.  Assess signs/symptoms and risk factors for hypertension, sleep-disordered breathing, neuropathy (including changes in gait and balance), retinopathy, nephropathy and sexual dysfunction.  Address pregnancy planning and contraceptive choice, especially when prescribing antihypertensive or statin.  Ensure completion of annual comprehensive foot exam and dilated eye exam.   Implement additional individualized goals and interventions based on identified risk factors.  Prepare patient for consultation or referral for specialist care, such as ophthalmology, neurology, cardiology, podiatry, nephrology or perinatology.   Notes:     Task: Monitor and Manage Follow-up for Comorbidities   Due Date: 03/12/2022  Priority: Routine  Responsible User: Jon Billings, RN  Note:   Care Management Activities:    - healthy lifestyle promoted - reduction of sedentary activity encouraged - response to pharmacologic therapy monitored    Notes: 12/19/20 Patient A1c 8.5. Patient started on ozempic last month  06/18/21 Patient Blood sugars lower.  Last sugar this am 109.  A1c 7.0 with ozempic.  Diabetes Management Discussed: Medication adherence Reviewed importance of limiting carbs such as rice, potatoes,  breads, and pastas. Also discussed limiting sweets and sugary drinks.  Discussed importance of portion control.  Also discussed importance of annual exams, foot exams, and eye exams.  Goals Addressed             This Visit's Progress    RN CM Monitor and Manage My Blood Sugar-Diabetes Type 2   On track    Timeframe:  Long-Range Goal Priority:  High Start Date:   09/30/20                          Expected End Date:      03/12/22 Follow Up Date 10/13/21   - take the blood sugar log to all doctor visits    Why is this important?   Checking your blood sugar at home helps to keep it from getting very high or very low.  Writing the results in a diary or log helps the doctor know how to care for you.  Your blood sugar log should have the time, date and the results.  Also, write down the amount of insulin or other medicine that you take.  Other information, like what you ate, exercise done and how you were feeling, will also be helpful.     Notes: 12/19/20 Continue to monitor sugars and limit carbohydrates.  06/18/21 Patient on ozempic.  A1c is better at 7.  Blood sugar report this am 109.  Keep up the great work.   Diabetes Management Discussed: Medication adherence Reviewed importance of limiting carbs such as rice, potatoes, breads, and pastas. Also discussed limiting sweets and sugary drinks.  Discussed importance of portion control.  Also discussed importance of annual exams, foot exams, and eye exams.        COMPLETED: RN CM-Make and Keep All Appointments   On track    Timeframe:  Long-Range Goal Priority:  High Start Date:     09/30/20                        Expected End Date:  06/12/21            Follow Up Date 04/12/21   - call to cancel if needed    Why is this important?   Part of staying healthy is seeing the doctor for follow-up care.  If you forget your appointments, there are some things you can do to stay on track.    Notes: 12/19/20 Continue to see physicians  as scheduled.        Plan:  Follow-up: Patient agrees to Care Plan and Follow-up. Follow-up in 3 month(s)  Jone Baseman, RN, MSN [facility] Care Management Coordinator Direct Line [phone] Cell [phone] Toll Free: [phone]  Fax: [phone]

## 2021-06-18 NOTE — Progress Notes (Signed)
Safety precautions to be maintained throughout the outpatient stay will include: orient to surroundings, keep bed in low position, maintain call bell within reach at all times, provide assistance with transfer out of bed and ambulation.  

## 2021-06-18 NOTE — Patient Instructions (Signed)

## 2021-06-18 NOTE — Patient Instructions (Signed)
Goals Addressed             This Visit's Progress    RN CM Monitor and Manage My Blood Sugar-Diabetes Type 2   On track    Timeframe:  Long-Range Goal Priority:  High Start Date:   09/30/20                          Expected End Date:      03/12/22 Follow Up Date 10/13/21   - take the blood sugar log to all doctor visits    Why is this important?   Checking your blood sugar at home helps to keep it from getting very high or very low.  Writing the results in a diary or log helps the doctor know how to care for you.  Your blood sugar log should have the time, date and the results.  Also, write down the amount of insulin or other medicine that you take.  Other information, like what you ate, exercise done and how you were feeling, will also be helpful.     Notes: 12/19/20 Continue to monitor sugars and limit carbohydrates.  06/18/21 Patient on ozempic.  A1c is better at 7.  Blood sugar report this am 109.  Keep up the great work.   Diabetes Management Discussed: Medication adherence Reviewed importance of limiting carbs such as rice, potatoes, breads, and pastas. Also discussed limiting sweets and sugary drinks.  Discussed importance of portion control.  Also discussed importance of annual exams, foot exams, and eye exams.        COMPLETED: RN CM-Make and Keep All Appointments   On track    Timeframe:  Long-Range Goal Priority:  High Start Date:     09/30/20                        Expected End Date:  06/12/21            Follow Up Date 04/12/21   - call to cancel if needed    Why is this important?   Part of staying healthy is seeing the doctor for follow-up care.  If you forget your appointments, there are some things you can do to stay on track.    Notes: 12/19/20 Continue to see physicians as scheduled.

## 2021-06-19 ENCOUNTER — Telehealth: Payer: Self-pay | Admitting: *Deleted

## 2021-06-19 NOTE — Telephone Encounter (Signed)
No problems post procedure. 

## 2021-07-02 ENCOUNTER — Other Ambulatory Visit: Payer: Self-pay

## 2021-07-02 ENCOUNTER — Ambulatory Visit: Payer: Medicare HMO | Attending: Pain Medicine | Admitting: Pain Medicine

## 2021-07-02 DIAGNOSIS — M25561 Pain in right knee: Secondary | ICD-10-CM | POA: Diagnosis not present

## 2021-07-02 DIAGNOSIS — M174 Other bilateral secondary osteoarthritis of knee: Secondary | ICD-10-CM

## 2021-07-02 DIAGNOSIS — M47817 Spondylosis without myelopathy or radiculopathy, lumbosacral region: Secondary | ICD-10-CM | POA: Diagnosis not present

## 2021-07-02 DIAGNOSIS — M545 Low back pain, unspecified: Secondary | ICD-10-CM

## 2021-07-02 DIAGNOSIS — M4310 Spondylolisthesis, site unspecified: Secondary | ICD-10-CM | POA: Diagnosis not present

## 2021-07-02 DIAGNOSIS — G894 Chronic pain syndrome: Secondary | ICD-10-CM | POA: Diagnosis not present

## 2021-07-02 DIAGNOSIS — G8929 Other chronic pain: Secondary | ICD-10-CM

## 2021-07-02 DIAGNOSIS — M47816 Spondylosis without myelopathy or radiculopathy, lumbar region: Secondary | ICD-10-CM | POA: Diagnosis not present

## 2021-07-02 DIAGNOSIS — Z6841 Body Mass Index (BMI) 40.0 and over, adult: Secondary | ICD-10-CM

## 2021-07-02 DIAGNOSIS — M5137 Other intervertebral disc degeneration, lumbosacral region: Secondary | ICD-10-CM | POA: Diagnosis not present

## 2021-07-02 DIAGNOSIS — M25562 Pain in left knee: Secondary | ICD-10-CM

## 2021-07-02 NOTE — Progress Notes (Signed)
Patient: Karen Lewis  Service Category: E/M  Provider: Gaspar Cola, MD  DOB: [birth year]  DOS: 07/02/2021  Location: Office  MRN: [id number]  Setting: Ambulatory outpatient  Referring Provider: Idelle Crouch, MD  Type: Established Patient  Specialty: Interventional Pain Management  PCP: Idelle Crouch, MD  Location: Remote location  Delivery: TeleHealth     Virtual Encounter - Pain Management PROVIDER NOTE: Information contained herein reflects review and annotations entered in association with encounter. Interpretation of such information and data should be left to medically-trained personnel. Information provided to patient can be located elsewhere in the medical record under "Patient Instructions". Document created using STT-dictation technology, any transcriptional errors that may result from process are unintentional.    Contact & Pharmacy Preferred: [phone] Home: [phone] (home) Mobile: [phone] (mobile) E-mail: [email or web address], [email or web address] - [address] [place] [address] Phone: [phone] Fax: [phone]   Pre-screening  Ms. Ortner offered "in-person" vs "virtual" encounter. She indicated preferring virtual for this encounter.   Reason COVID-19*  Social distancing based on CDC and AMA recommendations.   I contacted Karen Lewis on 07/02/2021 via telephone.      I clearly identified myself as Gaspar Cola, MD. I verified that I was speaking with the correct person using two identifiers (Name: NERINE PULSE, and date of birth: 1970-12-16).  Consent I sought verbal advanced consent from Karen Lewis for virtual visit interactions. I informed Ms. Matos of possible security and privacy concerns, risks, and limitations associated with providing "not-in-person" medical evaluation and management services. I also informed Ms. Mittelstadt of the availability of "in-person" appointments. Finally, I informed  her that there would be a charge for the virtual visit and that she could be  personally, fully or partially, financially responsible for it. Ms. Adler expressed understanding and agreed to proceed.   Historic Elements   Ms. SHAKTI FLEER is a 50 y.o. year old, female patient evaluated today after our last contact on 06/18/2021. Ms. Mesina  has a past medical history of Anemia, Anginal pain ([place]), Anxiety, Arthralgia of hip (07/29/2015), Arthritis, Arthritis, degenerative (07/29/2015), Asthma, Cephalalgia (07/25/2014), Dependence on unknown drug ([place]), Depression, Diabetes mellitus without complication ([place]), Difficult intubation, Dysrhythmia, Eczema, Fibromyalgia, Gastritis, GERD (gastroesophageal reflux disease), Gonalgia (07/29/2015), Gout, H/O cardiovascular disorder (03/10/2015), H/O surgical procedure (12/05/2012), H/O thyroid disease (03/10/2015), Headache, Herpes, History of artificial joint (07/29/2015), History of hiatal hernia, Hypertension, Hypomagnesemia, Hypothyroidism, IDA (iron deficiency anemia) (05/28/2019), LBP (low back pain) (07/29/2015), Neuromuscular disorder ([place]), Obesity, PCOS (polycystic ovarian syndrome), Primary osteoarthritis of both knees (07/29/2015), Sleep apnea, Thyroid nodule (bilateral), and Umbilical hernia. She also  has a past surgical history that includes Laparoscopic partial gastrectomy; Shoulder arthroscopy (Right); Carpal tunnel release (Bilateral); Diagnostic laparoscopy; Cholecystectomy; Trigger finger release (Right); Thyroidectomy (N/A, 11/12/2015); left trigger finger; Roux-en-Y Gastric Bypass (06/03/2017); Hiatal hernia repair; peniculectomy (N/A, 07/05/2018); Total hip arthroplasty (Right, 11/27/2019); Joint replacement (Bilateral, hip); Appendectomy; Trigger finger release (Right, 04/24/2020); Colonoscopy with propofol (N/A, 02/11/2021); and Esophagogastroduodenoscopy (egd) with propofol (N/A, 02/11/2021). Ms. Lovejoy has a current medication list which includes the following  prescription(s): accu-chek aviva plus, acetaminophen, acidophilus, albuterol, amitriptyline, vitamin c, benzonatate, biotin, butalbital-acetaminophen-caffeine, calcium carbonate, clonazepam, b-12, diclofenac sodium, diphenhydramine, dupixent, emgality, epinephrine, ergocalciferol, famotidine, fluticasone furoate-vilanterol, furosemide, accu-chek aviva plus, guaifenesin-codeine, levocetirizine, magnesium oxide, meclizine, metformin, metoprolol tartrate, montelukast, tab-a-vite, naloxone, naphazoline-pheniramine, nitrofurantoin (macrocrystal-monohydrate), omeprazole, oxycodone, [START ON 07/25/2021] oxycodone, [START ON 08/24/2021] oxycodone, [START ON 09/23/2021] oxycodone, ozempic (  0.25 or 0.5 mg/dose), quetiapine, rizatriptan, rizatriptan, rosuvastatin, sertraline, spironolactone, tizanidine, topiramate, zolpidem, zonisamide, levothyroxine, lisinopril, and pregabalin. She  reports that she has never smoked. She has never used smokeless tobacco. She reports that she does not drink alcohol and does not use drugs. Ms. Shapley is allergic to bactrim [sulfamethoxazole-trimethoprim], omalizumab, ciprofloxacin, shellfish allergy, and nsaids.   HPI  Today, she is being contacted for a post-procedure assessment.  The patient indicates having attained 100% relief of the pain for the duration of the local anesthetic which then slowly went down to 1 ongoing 75% improvement.  She refers still having some numbness over her thigh and she also states that her knee pain is beginning to come back.  She states that this Zilretta worked very well and she would like to have that repeated again.  This Zilretta injection was last done on 02/19/2021 and it has indeed gone past the 3 months that it is claimed to last.  Today I reminded the patient that this is a palliative procedure and will only buy her some time, but that the duration of her knees will continue to progress to the point where eventually the injections are no longer going to  help.  She indicated understanding.  Post-Procedure Evaluation  Procedure (06/18/2021):  Procedure:           Anesthesia, Analgesia, Anxiolysis:  Type: Lumbar Facet, Medial Branch Block(s)          Primary Purpose: Diagnostic Region: Posterolateral Lumbosacral Spine Level: L2, L3, L4, L5, & S1 Medial Branch Level(s). Injecting these levels blocks the L3-4, L4-5, and L5-S1 lumbar facet joints. Laterality: Bilateral   Type: Local Anesthesia Local Anesthetic: Lidocaine 1-2% Sedation: Minimal Anxiolysis  Indication(s): Anxiety & Analgesia Route: Infiltration (/IM) IV Access: Available     Position: Prone    Indications: 1. Lumbar facet syndrome (Bilateral) (R>L)   2. Grade 1 (1.4 cm) Anterolisthesis of L4 over L5   3. Spondylosis without myelopathy or radiculopathy, lumbosacral region   4. Osteoarthritis of lumbar spine without myelopathy or radiculopathy   5. DDD (degenerative disc disease), lumbosacral   6. Chronic low back pain (3ry area of Pain) (Bilateral) (R>L) w/o sciatica   7. Class 3 severe obesity due to excess calories with serious comorbidity and body mass index (BMI) of 60.0 to 69.9 in adult [facility])     Pain Score: Pre-procedure: 5 /10 Post-procedure: 0-No pain/10    RTCB: 10/23/2021  Anxiolysis: Please see nurses note.  Effectiveness during initial hour after procedure (Ultra-Short Term Relief): 100 %.  Local anesthetic used: Long-acting (4-6 hours) Effectiveness: Defined as any analgesic benefit obtained secondary to the administration of local anesthetics. This carries significant diagnostic value as to the etiological location, or anatomical origin, of the pain. Duration of benefit is expected to coincide with the duration of the local anesthetic used.  Effectiveness during initial 4-6 hours after procedure (Short-Term Relief): 100 %.  Long-term benefit: Defined as any relief past the pharmacologic duration of the local anesthetics.  Effectiveness past the initial  6 hours after procedure (Long-Term Relief): 75 % .  Benefits, current: Defined as benefit present at the time of this evaluation.   Analgesia: The patient indicates having attained 100% relief of the pain for the duration of the local anesthetic.  At this point she still has an ongoing 75% improvement of her low back pain. Function: Ms. Quizon reports improvement in function ROM: Ms. Matsuoka reports improvement in ROM  Pharmacotherapy Assessment   Analgesic: Oxycodone  IR 5 mg 1 tab PO 5X/day (#150/mo) (25 mg/day) MME/day: 37.5 mg/day.   Monitoring: [place] PMP: PDMP reviewed during this encounter.       Pharmacotherapy: No side-effects or adverse reactions reported. Compliance: No problems identified. Effectiveness: Clinically acceptable. Plan: Refer to "POC". UDS:  Summary  Date Value Ref Range Status  02/19/2020 Note  Final    Comment:    ==================================================================== [employer] (MW) ==================================================================== Test                             Result       Flag       Units  Drug Present and Declared for Prescription Verification   7-aminoclonazepam              44           EXPECTED   ng/mg creat    7-aminoclonazepam is an expected metabolite of clonazepam. Source of    clonazepam is a scheduled prescription medication.  Drug Present not Declared for Prescription Verification   Oxycodone                      199          UNEXPECTED ng/mg creat   Oxymorphone                    127          UNEXPECTED ng/mg creat   Noroxycodone                   1026         UNEXPECTED ng/mg creat   Noroxymorphone                 56           UNEXPECTED ng/mg creat    Sources of oxycodone are scheduled prescription medications.    Oxymorphone, noroxycodone, and noroxymorphone are expected    metabolites of oxycodone. Oxymorphone is also available as a    scheduled prescription medication.  Drug Absent but Declared  for Prescription Verification   Butalbital                     Not Detected UNEXPECTED ==================================================================== Test                      Result    Flag   Units      Ref Range   Creatinine              165              mg/dL      >=20 ==================================================================== Declared Medications:  The flagging and interpretation on this report are based on the  following declared medications.  Unexpected results may arise from  inaccuracies in the declared medications.   **Note: The testing scope of this panel includes these medications:   Butalbital (Fioricet)  Clonazepam (Klonopin)   **Note: The testing scope of this panel does not include the  following reported medications:   Acetaminophen (Tylenol)  Acetaminophen (Fioricet)  Albuterol (Ventolin HFA)  Amitriptyline (Elavil)  Biotin  Caffeine (Fioricet)  Calcium (Tums)  Diphenhydramine (Benadryl)  Epinephrine (EpiPen)  Eye Drops  Famotidine (Pepcid)  Levocetirizine (Xyzal)  Levothyroxine (Synthroid)  Lisinopril (Zestril)  Magnesium (Mag-Ox)  Meclizine (Antivert)  Medroxyprogesterone (Depo-Provera)  Metformin (Glucophage)  Metoprolol (Lopressor)  Montelukast (Singulair)  Multivitamin  Naloxone (Narcan)  Pregabalin (Lyrica)  Probiotic  Quetiapine (Seroquel)  Rosuvastatin (Crestor)  Sertraline (Zoloft)  Spironolactone (Aldactone)  Tizanidine (Zanaflex)  Topical  Topical Diclofenac  Triamcinolone (Nasacort)  Vitamin B  Vitamin B12  Vitamin C  Vitamin D2  Zolpidem (Ambien)  Zonisamide (Zonegran) ==================================================================== For clinical consultation, please call [phone]. ====================================================================      Laboratory Chemistry Profile   Renal Lab Results  Component Value Date   BUN 19 04/28/2021   CREATININE 1.06 (H) 04/28/2021   GFRAA >60  04/16/2020   GFRNONAA >60 04/28/2021    Hepatic Lab Results  Component Value Date   AST 34 04/22/2021   ALT 34 04/22/2021   ALBUMIN 4.1 04/22/2021   ALKPHOS 63 04/22/2021    Electrolytes Lab Results  Component Value Date   NA 134 (L) 04/28/2021   K 4.5 04/28/2021   CL 101 04/28/2021   CALCIUM 8.4 (L) 04/28/2021   MG 2.1 04/28/2021   PHOS 5.5 (H) 05/22/2019    Bone Lab Results  Component Value Date   VD25OH 27.4 (L) 11/24/2015   VD125OH2TOT 41.5 11/24/2015    Inflammation (CRP: Acute Phase) (ESR: Chronic Phase) Lab Results  Component Value Date   CRP 0.7 09/27/2019   ESRSEDRATE 30 (H) 09/27/2019         Note: Above Lab results reviewed.  Imaging  DG PAIN CLINIC C-ARM 1-60 MIN NO REPORT Fluoro was used, but no Radiologist interpretation will be provided.  Please refer to "NOTES" tab for provider progress note.  Assessment  The primary encounter diagnosis was Lumbar facet syndrome (Bilateral) (R>L). Diagnoses of Grade 1 (1.4 cm) Anterolisthesis of L4 over L5, Spondylosis without myelopathy or radiculopathy, lumbosacral region, Osteoarthritis of lumbar spine without myelopathy or radiculopathy, DDD (degenerative disc disease), lumbosacral, Chronic low back pain (3ry area of Pain) (Bilateral) (R>L) w/o sciatica, Chronic knee pain (1ry area of Pain) (Bilateral) (R>L), Secondary Osteoarthritis of knee (Bilateral) (R>L), Chronic pain syndrome, and Class 3 severe obesity due to excess calories with serious comorbidity and body mass index (BMI) of 60.0 to 69.9 in adult [facility]) were also pertinent to this visit.  Plan of Care  Problem-specific:  No problem-specific Assessment & Plan notes found for this encounter.  Ms. AYDEN APODACA has a current medication list which includes the following long-term medication(s): albuterol, clonazepam, diphenhydramine, furosemide, magnesium oxide, metformin, metoprolol tartrate, montelukast, naloxone, oxycodone, [START ON 07/25/2021] oxycodone,  [START ON 08/24/2021] oxycodone, [START ON 09/23/2021] oxycodone, rizatriptan, rizatriptan, spironolactone, zonisamide, levothyroxine, lisinopril, and pregabalin.  Pharmacotherapy (Medications Ordered): No orders of the defined types were placed in this encounter.  Orders:  Orders Placed This Encounter  Procedures   KNEE INJECTION    Local Anesthetic & Steroid injection.    Standing Status:   Future    Standing Expiration Date:   10/02/2021    Scheduling Instructions:     Side: Bilateral     Sedation: With Sedation.     Timeframe: As soon as schedule allows    Order Specific Question:   Where will this procedure be performed?    Answer:   [facility]   Follow-up plan:   Return for (Clinic) procedure: (B) IA Zilretta Knee inj. w/ Fluoro, (Sed-anx).      Interventional Therapies  Risk  Complexity Considerations:   Estimated body mass index is 60.59 kg/m as calculated from the following:   Height as of 06/18/21: 5' 4" (1.626 m).   Weight as of 06/18/21: 353 lb (160.1  kg). NOTE: NO RFA until BMI less or equal to 35.  (Gastric bypass done on 06/03/2017) Iodine allergy  Contrast dye allergy  Shellfish allergy    Planned  Pending:   Pending further evaluation   Under consideration:   Diagnostic bilateral genicular NB    Completed:   Therapeutic bilateral IA Zilretta knee injection x1 (02/19/2021) (100/100/100/100 x3.5 months)  Therapeutic bilateral IA Hyalgan knee injection x16 (06/24/2020)  Therapeutic bilateral THR (total hip replacements) (Dr. Rudene Christians) (Right: 11/27/2019) (Left: 07/29/2015)  Palliative/therapeutic bilateral lumbar facet MBB x6 (06/18/2021) (100/100/75/75)  Therapeutic right IA hip injection x3 (07/07/2017)  Therapeutic left IA shoulder (glenohumeral joint) injection x1 (05/31/2017)    Therapeutic  Palliative (PRN) options:   Palliative bilateral knee injections  Palliative bilateral lumbar facet MBBs     Recent Visits Date Type Provider Dept  06/18/21  Procedure visit Milinda Pointer, MD [facility]  06/03/21 Office Visit Milinda Pointer, MD [facility]  04/22/21 Office Visit Milinda Pointer, MD [facility]  04/14/21 Procedure visit Milinda Pointer, MD [facility]  Showing recent visits within past 90 days and meeting all other requirements Today's Visits Date Type Provider Dept  07/02/21 Office Visit Milinda Pointer, MD [facility]  Showing today's visits and meeting all other requirements Future Appointments No visits were found meeting these conditions. Showing future appointments within next 90 days and meeting all other requirements I discussed the assessment and treatment plan with the patient. The patient was provided an opportunity to ask questions and all were answered. The patient agreed with the plan and demonstrated an understanding of the instructions.  Patient advised to call back or seek an in-person evaluation if the symptoms or condition worsens.  Duration of encounter: 18 minutes.  Note by: Gaspar Cola, MD Date: 07/02/2021; Time: 3:14 PM

## 2021-07-06 DIAGNOSIS — E119 Type 2 diabetes mellitus without complications: Secondary | ICD-10-CM | POA: Diagnosis not present

## 2021-07-06 DIAGNOSIS — Z9884 Bariatric surgery status: Secondary | ICD-10-CM | POA: Diagnosis not present

## 2021-07-15 DIAGNOSIS — G43719 Chronic migraine without aura, intractable, without status migrainosus: Secondary | ICD-10-CM | POA: Diagnosis not present

## 2021-07-16 DIAGNOSIS — S299XXA Unspecified injury of thorax, initial encounter: Secondary | ICD-10-CM | POA: Diagnosis not present

## 2021-07-16 DIAGNOSIS — R0781 Pleurodynia: Secondary | ICD-10-CM | POA: Diagnosis not present

## 2021-07-16 DIAGNOSIS — I517 Cardiomegaly: Secondary | ICD-10-CM | POA: Diagnosis not present

## 2021-07-28 ENCOUNTER — Ambulatory Visit: Payer: Medicare HMO | Admitting: Pain Medicine

## 2021-08-04 ENCOUNTER — Ambulatory Visit
Admission: RE | Admit: 2021-08-04 | Discharge: 2021-08-04 | Disposition: A | Discharge status: Home / Self Care | Payer: Medicare HMO | Source: Ambulatory Visit | Attending: Pain Medicine | Admitting: Pain Medicine

## 2021-08-04 ENCOUNTER — Ambulatory Visit (HOSPITAL_BASED_OUTPATIENT_CLINIC_OR_DEPARTMENT_OTHER): Payer: Medicare HMO | Admitting: Pain Medicine

## 2021-08-04 ENCOUNTER — Ambulatory Visit (INDEPENDENT_AMBULATORY_CARE_PROVIDER_SITE_OTHER): Payer: Medicare HMO | Admitting: Physician Assistant

## 2021-08-04 ENCOUNTER — Encounter: Payer: Self-pay | Admitting: Physician Assistant

## 2021-08-04 ENCOUNTER — Encounter: Payer: Self-pay | Admitting: Pain Medicine

## 2021-08-04 ENCOUNTER — Other Ambulatory Visit: Payer: Self-pay

## 2021-08-04 VITALS — BP 97/70 | HR 80 | Ht 64.0 in | Wt 352.0 lb

## 2021-08-04 VITALS — BP 107/81 | HR 87 | Temp 97.2°F | Resp 18 | Ht 64.0 in | Wt 352.0 lb

## 2021-08-04 DIAGNOSIS — M25561 Pain in right knee: Secondary | ICD-10-CM | POA: Insufficient documentation

## 2021-08-04 DIAGNOSIS — M25562 Pain in left knee: Secondary | ICD-10-CM

## 2021-08-04 DIAGNOSIS — M174 Other bilateral secondary osteoarthritis of knee: Secondary | ICD-10-CM

## 2021-08-04 DIAGNOSIS — N390 Urinary tract infection, site not specified: Secondary | ICD-10-CM | POA: Diagnosis not present

## 2021-08-04 DIAGNOSIS — M153 Secondary multiple arthritis: Secondary | ICD-10-CM | POA: Insufficient documentation

## 2021-08-04 DIAGNOSIS — Z6841 Body Mass Index (BMI) 40.0 and over, adult: Secondary | ICD-10-CM | POA: Diagnosis not present

## 2021-08-04 DIAGNOSIS — G8929 Other chronic pain: Secondary | ICD-10-CM

## 2021-08-04 MED ORDER — LIDOCAINE HCL 2 % IJ SOLN
20.0000 mL | Freq: Once | INTRAMUSCULAR | Status: AC
  Administered 2021-08-04: 200 mg
  Filled 2021-08-04: qty 20

## 2021-08-04 MED ORDER — LIDOCAINE HCL (PF) 2 % IJ SOLN
4.0000 mL | Freq: Once | INTRAMUSCULAR | Status: AC
  Administered 2021-08-04: 2 mL
  Filled 2021-08-04: qty 4

## 2021-08-04 MED ORDER — LACTATED RINGERS IV SOLN
1000.0000 mL | Freq: Once | INTRAVENOUS | Status: AC
  Administered 2021-08-04: 1000 mL via INTRAVENOUS

## 2021-08-04 MED ORDER — ROPIVACAINE HCL 2 MG/ML IJ SOLN
4.0000 mL | Freq: Once | INTRAMUSCULAR | Status: AC
  Administered 2021-08-04: 2 mL

## 2021-08-04 MED ORDER — TRIAMCINOLONE ACETONIDE 32 MG IX SRER
32.0000 mg | Freq: Once | INTRA_ARTICULAR | Status: AC
  Administered 2021-08-04: 32 mg via INTRA_ARTICULAR
  Filled 2021-08-04: qty 5

## 2021-08-04 MED ORDER — FLUCONAZOLE 150 MG PO TABS: 150.0000 mg | ORAL_TABLET | Freq: Once | ORAL | 0 refills | Status: AC

## 2021-08-04 MED ORDER — CEFTRIAXONE SODIUM 1 G IJ SOLR
1.0000 g | Freq: Once | INTRAMUSCULAR | Status: AC
  Administered 2021-08-04: 1 g via INTRAMUSCULAR

## 2021-08-04 MED ORDER — CEFUROXIME AXETIL 250 MG PO TABS: 250.0000 mg | ORAL_TABLET | Freq: Two times a day (BID) | ORAL | 0 refills | Status: AC

## 2021-08-04 MED ORDER — ROPIVACAINE HCL 2 MG/ML IJ SOLN
INTRAMUSCULAR | Status: AC
  Filled 2021-08-04: qty 20

## 2021-08-04 MED ORDER — MIDAZOLAM HCL 5 MG/5ML IJ SOLN
0.5000 mg | Freq: Once | INTRAMUSCULAR | Status: AC
  Administered 2021-08-04: 2 mg via INTRAVENOUS
  Filled 2021-08-04: qty 5

## 2021-08-04 NOTE — Patient Instructions (Signed)

## 2021-08-04 NOTE — Progress Notes (Signed)
08/04/2021 4:44 PM   STEPHANEY Lewis [birth year] [id number]  CC: Chief Complaint  Patient presents with   Recurrent UTI   HPI: Karen Lewis is a 50 y.o. female with PMH recurrent UTI on daily suppressive Macrobid who presents today for evaluation of possible UTI.   Today she reports an approximate 3-week prodrome of urinary symptoms.  They started with an episode of nocturnal enuresis, which is atypical for her at baseline.  2 weeks ago, she developed bladder spasms, malodorous urine, and dysuria and she increased her probiotics to twice daily, increase her perineal hygiene, and increased water intake.  Her symptoms improved with these measures.  1 week ago, her symptoms of bladder spasms, malodorous urine, and dysuria returned and have been progressively worsening.  She also reports some flank pain and is concerned for an early a sending infection given the duration of her symptoms.  She has been taking Pyridium, which helps with her dysuria.  She did not take Macrobid today.  In-office UA today positive for orange color, otherwise unable to read due to pigment interference; urine microscopy with >30 WBCs/HPF, 3-10 RBCs/HPF, and many bacteria.   PMH: Past Medical History:  Diagnosis Date   Anemia    Anginal pain ([place])    Anxiety    Arthralgia of hip 07/29/2015   Arthritis    Arthritis, degenerative 07/29/2015   Asthma    Cephalalgia 07/25/2014   Dependence on unknown drug ([place])    multiplt controlled drug dependence   Depression    Diabetes mellitus without complication ([place])    Difficult intubation    per pt needs small tube (has had abrasions from tube in past)   Dysrhythmia    PVC's   Eczema    Fibromyalgia    Gastritis    GERD (gastroesophageal reflux disease)    Gonalgia 07/29/2015   Overview:  Overview:  The patient has had bilateral intra-articular Hyalgan injections done on 07/16/2014 and although she seems to do well with this type of therapy, apparently her  insurance company does not want to pay for they Hyalgan. On 11/27/2014 the patient underwent a bilateral genicular nerve block with excellent results. On 01/28/2015 she had a right knee genicular radiofrequency ablatio   Gout    H/O cardiovascular disorder 03/10/2015   H/O surgical procedure 12/05/2012   Overview:  LSG ([facility] - April 2013)     H/O thyroid disease 03/10/2015   Headache    Herpes    History of artificial joint 07/29/2015   History of hiatal hernia    Hypertension    Hypomagnesemia    Hypothyroidism    IDA (iron deficiency anemia) 05/28/2019   LBP (low back pain) 07/29/2015   Neuromuscular disorder ([facility])    Obesity    PCOS (polycystic ovarian syndrome)    Primary osteoarthritis of both knees 07/29/2015   Sleep apnea    not using a Cpap machine at this time - most recent test mild apnea does not qualify for test (not OSA)   Thyroid nodule bilateral   Umbilical hernia     Surgical History: Past Surgical History:  Procedure Laterality Date   APPENDECTOMY     CARPAL TUNNEL RELEASE Bilateral    CHOLECYSTECTOMY     COLONOSCOPY WITH PROPOFOL N/A 02/11/2021   Procedure: COLONOSCOPY WITH PROPOFOL;  Surgeon: Toledo, Benay Pike, MD;  Location: [facility];  Service: Gastroenterology;  Laterality: N/A;   DIAGNOSTIC LAPAROSCOPY     ESOPHAGOGASTRODUODENOSCOPY (EGD) WITH PROPOFOL N/A 02/11/2021  Procedure: ESOPHAGOGASTRODUODENOSCOPY (EGD) WITH PROPOFOL;  Surgeon: Toledo, Benay Pike, MD;  Location: [facility];  Service: Gastroenterology;  Laterality: N/A;   HIATAL HERNIA REPAIR     JOINT REPLACEMENT Bilateral hip   LAPAROSCOPIC PARTIAL GASTRECTOMY     left trigger finger     peniculectomy N/A 07/05/2018   ROUX-EN-Y GASTRIC BYPASS  06/03/2017   SHOULDER ARTHROSCOPY Right    THYROIDECTOMY N/A 11/12/2015   Procedure: THYROIDECTOMY;  Surgeon: Clyde Canterbury, MD;  Location: [facility];  Service: ENT;  Laterality: N/A;   TOTAL HIP ARTHROPLASTY Right 11/27/2019   Procedure: TOTAL HIP  ARTHROPLASTY ANTERIOR APPROACH;  Surgeon: Hessie Knows, MD;  Location: [facility];  Service: Orthopedics;  Laterality: Right;   TRIGGER FINGER RELEASE Right    TRIGGER FINGER RELEASE Right 04/24/2020   Procedure: Right ring and middle trigger release;  Surgeon: Hessie Knows, MD;  Location: [facility];  Service: Orthopedics;  Laterality: Right;    Home Medications:  Allergies as of 08/04/2021       Reactions   Bactrim [sulfamethoxazole-trimethoprim] Hives   Omalizumab Itching, Hives   Ciprofloxacin Other (See Comments)   myalgia   Shellfish Allergy Other (See Comments)   + positive allergy test   Nsaids Nausea Only, Other (See Comments)   Stomach upset        Medication List        Accurate as of August 04, 2021  4:44 PM. If you have any questions, ask your nurse or doctor.          Accu-Chek Aviva Plus test strip Generic drug: glucose blood Use 2 (two) times daily   Accu-Chek Aviva Plus test strip Generic drug: glucose blood   acetaminophen 650 MG CR tablet Commonly known as: TYLENOL Take 650-1,300 mg by mouth every 8 (eight) hours as needed for pain.   acidophilus Caps capsule Take 1 capsule by mouth daily.   albuterol 108 (90 Base) MCG/ACT inhaler Commonly known as: VENTOLIN HFA Inhale 2 puffs into the lungs every 6 (six) hours as needed for wheezing or shortness of breath.   amitriptyline 100 MG tablet Commonly known as: ELAVIL Take 50 mg by mouth at bedtime.   B-12 2500 MCG Tabs Take 2,500 mcg by mouth daily.   benzonatate 100 MG capsule Commonly known as: TESSALON Take 100 mg by mouth.   Biotin 10 MG Caps Take 10 mg by mouth daily.   butalbital-acetaminophen-caffeine 50-325-40 MG tablet Commonly known as: FIORICET Take 1 tablet by mouth every 6 (six) hours as needed for migraine.   calcium carbonate 750 MG chewable tablet Commonly known as: TUMS EX Chew 1 tablet by mouth daily.   cefUROXime 250 MG tablet Commonly known as: CEFTIN Take 1  tablet (250 mg total) by mouth 2 (two) times daily with a meal for 5 days. Started by: Debroah Loop, PA-C   clonazePAM 0.5 MG tablet Commonly known as: KLONOPIN Take 1 tablet (0.5 mg total) by mouth 3 (three) times daily as needed for anxiety.   diclofenac sodium 1 % Gel Commonly known as: Voltaren Apply 2 g topically 4 (four) times daily. What changed:  when to take this reasons to take this   diphenhydrAMINE 25 MG tablet Commonly known as: BENADRYL Take 25 mg by mouth every 8 (eight) hours as needed for itching or allergies.   Dupixent 300 MG/2ML Sopn Generic drug: Dupilumab   Emgality 120 MG/ML Soaj Generic drug: Galcanezumab-gnlm Inject into the skin.   EPINEPHrine 0.3 mg/0.3 mL Soaj injection Commonly known  as: EPI-PEN Inject 0.3 mg into the muscle as needed for anaphylaxis.   ergocalciferol 1.25 MG (50000 UT) capsule Commonly known as: VITAMIN D2 Take 50,000 Units by mouth 2 (two) times a week.   famotidine 20 MG tablet Commonly known as: PEPCID Take 20 mg by mouth at bedtime.   fluconazole 150 MG tablet Commonly known as: DIFLUCAN Take 1 tablet (150 mg total) by mouth once for 1 dose. Started by: Debroah Loop, PA-C   fluticasone furoate-vilanterol 200-25 MCG/INH Aepb Commonly known as: BREO ELLIPTA Inhale 1 puff into the lungs daily.   furosemide 20 MG tablet Commonly known as: LASIX Take 20 mg by mouth daily as needed.   guaiFENesin-codeine 100-10 MG/5ML syrup Commonly known as: ROBITUSSIN AC Take by mouth.   levocetirizine 5 MG tablet Commonly known as: XYZAL Take 5 mg by mouth at bedtime.   levothyroxine 125 MCG tablet Commonly known as: SYNTHROID Take 200 mcg by mouth daily before breakfast.   lisinopril 5 MG tablet Commonly known as: ZESTRIL Take 5 mg by mouth at bedtime.   magnesium oxide 400 MG tablet Commonly known as: MAG-OX Take 1,200 mg by mouth 2 (two) times daily. Take 1200 mg by mouth in the morning and 1200 mg  at bedtime   meclizine 25 MG tablet Commonly known as: ANTIVERT Take 25 mg by mouth 3 (three) times daily as needed for dizziness.   metFORMIN 1000 MG tablet Commonly known as: GLUCOPHAGE Take 1,000 mg by mouth 2 (two) times daily with a meal.   metoprolol tartrate 50 MG tablet Commonly known as: LOPRESSOR Take 75 mg by mouth 2 (two) times daily.   montelukast 10 MG tablet Commonly known as: SINGULAIR Take 10 mg by mouth at bedtime.   naloxone 4 MG/0.1ML Liqd nasal spray kit Commonly known as: NARCAN Place 1 spray into the nose as needed for up to 365 doses (for opioid-induced respiratory depresssion). In case of emergency (overdose), spray once into each nostril. If no response within 3 minutes, repeat application and call [phone].   naphazoline-pheniramine 0.025-0.3 % ophthalmic solution Commonly known as: NAPHCON-A Place 1 drop into both eyes 4 (four) times daily as needed for eye irritation.   nitrofurantoin (macrocrystal-monohydrate) 100 MG capsule Commonly known as: Macrobid Take 1 capsule (100 mg total) by mouth daily.   omeprazole 40 MG capsule Commonly known as: PRILOSEC Take 40 mg by mouth in the morning and at bedtime.   oxyCODONE 5 MG immediate release tablet Commonly known as: Oxy IR/ROXICODONE Take 1 tablet (5 mg total) by mouth 5 (five) times daily. Must last 30 days   oxyCODONE 5 MG immediate release tablet Commonly known as: Oxy IR/ROXICODONE Take 1 tablet (5 mg total) by mouth 5 (five) times daily. Must last 30 days   oxyCODONE 5 MG immediate release tablet Commonly known as: Oxy IR/ROXICODONE Take 1 tablet (5 mg total) by mouth 5 (five) times daily. Must last 30 days Start taking on: August 24, 2021   oxyCODONE 5 MG immediate release tablet Commonly known as: Oxy IR/ROXICODONE Take 1 tablet (5 mg total) by mouth 5 (five) times daily. Must last 30 days Start taking on: September 23, 2021   Ozempic (0.25 or 0.5 MG/DOSE) 2 MG/1.5ML Sopn Generic drug:  Semaglutide(0.25 or 0.5MG/DOS) Inject 1 mg into the skin.   pregabalin 225 MG capsule Commonly known as: LYRICA Take 1 capsule (225 mg total) by mouth 2 (two) times daily.   QUEtiapine 100 MG tablet Commonly known as: SEROQUEL Take 100  mg by mouth at bedtime.   rizatriptan 10 MG disintegrating tablet Commonly known as: MAXALT-MLT Take 10 mg by mouth. Take 1 and may repeat in 2 hours  for Migraines   rizatriptan 10 MG tablet Commonly known as: MAXALT Take 1 and may repeat in 2 hours for MIgraines, alternate with the disintrgrating tablet.   rosuvastatin 40 MG tablet Commonly known as: CRESTOR Take 40 mg by mouth daily.   sertraline 100 MG tablet Commonly known as: ZOLOFT Take 200 mg by mouth daily.   spironolactone 25 MG tablet Commonly known as: ALDACTONE Take 37.5 mg by mouth 2 (two) times daily.   Tab-A-Vite Tabs   tiZANidine 4 MG tablet Commonly known as: ZANAFLEX Take 1 tablet (4 mg total) by mouth 2 (two) times daily as needed for muscle spasms.   topiramate 100 MG tablet Commonly known as: TOPAMAX Take 200 mg by mouth daily. Nightly   vitamin C 1000 MG tablet Take 1,000 mg by mouth daily.   zolpidem 10 MG tablet Commonly known as: AMBIEN Take 10 mg by mouth at bedtime as needed for sleep.   zonisamide 50 MG capsule Commonly known as: ZONEGRAN Take 150 mg by mouth at bedtime.        Allergies:  Allergies  Allergen Reactions   Bactrim [Sulfamethoxazole-Trimethoprim] Hives   Omalizumab Itching and Hives   Ciprofloxacin Other (See Comments)    myalgia    Shellfish Allergy Other (See Comments)    + positive allergy test   Nsaids Nausea Only and Other (See Comments)    Stomach upset    Family History: Family History  Problem Relation Age of Onset   Anxiety disorder Mother    Depression Mother    Alcohol abuse Mother    Diabetes Mother    Hypertension Mother    Kidney cancer Mother    Sleep apnea Mother    Alcohol abuse Father    Anxiety  disorder Father    Depression Father    Post-traumatic stress disorder Father    Kidney failure Father    COPD Father    Diabetes Father    Hypertension Father    Sleep apnea Father    Depression Brother    Diabetes Brother    Hypertension Brother    Sleep apnea Brother    Breast cancer Paternal Aunt    Bladder Cancer Neg Hx    Prostate cancer Neg Hx     Social History:   reports that she has never smoked. She has never used smokeless tobacco. She reports that she does not drink alcohol and does not use drugs.  Physical Exam: BP 97/70   Pulse 80   Ht '5\' 4"'  (1.626 m)   Wt (!) 352 lb (159.7 kg)   BMI 60.42 kg/m   Constitutional:  Alert and oriented, no acute distress, nontoxic appearing HEENT: [place], AT Cardiovascular: No clubbing, cyanosis, or edema Respiratory: Normal respiratory effort, no increased work of breathing Skin: No rashes, bruises or suspicious lesions Neurologic: Grossly intact, no focal deficits, moving all 4 extremities Psychiatric: Normal mood and affect  Laboratory Data: Results for orders placed or performed in visit on 08/04/21  CULTURE, URINE COMPREHENSIVE   Specimen: Urine   UR  Result Value Ref Range   Urine Culture, Comprehensive Preliminary report (A)    Organism ID, Bacteria Escherichia coli (A)   Microscopic Examination   Urine  Result Value Ref Range   WBC, UA >30 (A) 0 - 5 /hpf   RBC  3-10 (A) 0 - 2 /hpf   Epithelial Cells (non renal) 0-10 0 - 10 /hpf   Bacteria, UA Many (A) None seen/Few  Urinalysis, Complete  Result Value Ref Range   Specific Gravity, UA 1.015 1.005 - 1.030   pH, UA 6.5 5.0 - 7.5   Color, UA Orange Yellow   Appearance Ur Cloudy (A) Clear   Protein,UA CANCELED    Glucose, UA CANCELED    Ketones, UA CANCELED    Bilirubin, UA Negative Negative   Microscopic Examination See below:    Assessment & Plan:   1. Recurrent UTI UA today is grossly infected.  Given symptomatic duration, reports of flank pain, and soft BPs  in clinic today, I administered a shot of IM Rocephin and will start empiric cefuroxime.  Patient is in agreement with this plan.  We will plan for lab visit with UA in 1 week to prove resolution of microscopic hematuria.  Additionally, prescribing 1 dose of Diflucan per patient request. - Urinalysis, Complete - CULTURE, URINE COMPREHENSIVE - cefUROXime (CEFTIN) 250 MG tablet; Take 1 tablet (250 mg total) by mouth 2 (two) times daily with a meal for 5 days.  Dispense: 10 tablet; Refill: 0 - cefTRIAXone (ROCEPHIN) injection 1 g - fluconazole (DIFLUCAN) 150 MG tablet; Take 1 tablet (150 mg total) by mouth once for 1 dose.  Dispense: 1 tablet; Refill: 0  Return in about 1 week (around 08/11/2021) for Lab visit for UA.  Debroah Loop, PA-C  [facility] [address], [place],  [address] [phone]

## 2021-08-04 NOTE — Progress Notes (Signed)
Safety precautions to be maintained throughout the outpatient stay will include: orient to surroundings, keep bed in low position, maintain call bell within reach at all times, provide assistance with transfer out of bed and ambulation.  

## 2021-08-04 NOTE — Progress Notes (Signed)
PROVIDER NOTE: Interpretation of information contained herein should be left to medically-trained personnel. Specific patient instructions are provided elsewhere under "Patient Instructions" section of medical record. This document was created in part using STT-dictation technology, any transcriptional errors that may result from this process are unintentional.  Patient: Karen Lewis Service: Procedure Provider: Gaspar Cola, MD Type: Established DOS: 08/04/2021 Specialty: Interventional PM DOB: [birth year] Setting: Ambulatory Specialty designation: 09 MRN: [id number] Location: Ambulatory outpatient facility Location: Outpatient facility PCP: Idelle Crouch, MD Delivery: Face-to-face Ref. Prov.: Doy Hutching Leonie Douglas, MD  Procedure [facility] Interventional Pain Management )    Procedure: ER-steroid (Zilretta) Intra-articular Knee Injection  No.: n/a  Series: n/a Level/approach: Lateral Laterality: Bilateral (-50) Imaging guidance: None required ([id number]) Analgesia: Local anesthesia Sedation: None.   Purpose: Diagnostic/Therapeutic Indications: Knee arthralgia associated to osteoarthritis of the knee  NAS-11 score:   Pre-procedure: 5  (left knee is worse)/10   Post-procedure: 1 /10     1. Chronic knee pain (1ry area of Pain) (Bilateral) (R>L)   2. Secondary Osteoarthritis of knee (Bilateral) (R>L)   3. Secondary osteoarthritis of multiple sites   4. Class 3 severe obesity due to excess calories with serious comorbidity and body mass index (BMI) of 60.0 to 69.9 in adult [facility])    Pre-Procedure Preparation  Monitoring: As per clinic protocol.  Risk Assessment: Vitals:  TDV:VOHYWVPXT body mass index is 60.42 kg/m as calculated from the following:   Height as of this encounter: 5\' 4"  (1.626 m).   Weight as of this encounter: 352 lb (159.7 kg)., Rate:87ECG Heart Rate: 83, BP:114/68, Resp:16, Temp:(!) 97.2 F (36.2 C), SpO2:95 %  Allergies: She is allergic to bactrim  [sulfamethoxazole-trimethoprim], omalizumab, ciprofloxacin, shellfish allergy, and nsaids.  Precautions: No radiological contrast used  Blood-thinner(s): None at this time  Coagulopathies: Reviewed. None identified.   Active Infection(s): Reviewed. None identified. Karen Lewis is afebrile   Location setting: Exam room Position: Supine in semi-sitting position. Pillows under knee, bent 45-degree angle. Safety Precautions: Patient was assessed for positional comfort and pressure points before starting the procedure. Prepping solution: DuraPrep (Iodine Povacrylex [0.7% available iodine] and Isopropyl Alcohol, 74% w/w) Prep Area: Entire knee region Approach: percutaneous, just above the tibial plateau, lateral to the infrapatellar tendon. Intended target: Intra-articular knee space Materials: Tray: Block Needle(s): Regular Qty: 1/side Length: (45mm) 3.5-inch Gauge: 22G   Meds ordered this encounter  Medications   lidocaine (XYLOCAINE) 2 % (with pres) injection 400 mg   lactated ringers infusion 1,000 mL   midazolam (VERSED) 5 MG/5ML injection 0.5-2 mg    Make sure Flumazenil is available in the pyxis when using this medication. If oversedation occurs, administer 0.2 mg IV over 15 sec. If after 45 sec no response, administer 0.2 mg again over 1 min; may repeat at 1 min intervals; not to exceed 4 doses (1 mg)   Triamcinolone Acetonide SRER 32 mg    Maintain refrigerated.  Prepared suspension may be stored up to 4 hours at ambient conditions.   Triamcinolone Acetonide SRER 32 mg    Maintain refrigerated.  Prepared suspension may be stored up to 4 hours at ambient conditions.   lidocaine HCl (PF) (XYLOCAINE) 2 % injection 4 mL   ropivacaine (PF) 2 mg/mL (0.2%) (NAROPIN) injection 4 mL     Orders Placed This Encounter  Procedures   KNEE INJECTION    Local Anesthetic & Steroid injection.    Scheduling Instructions:     Side(s): Bilateral Knee     Sedation:  With Sedation.     Timeframe:  Today    Order Specific Question:   Where will this procedure be performed?    Answer:   [facility]   DG PAIN CLINIC C-ARM 1-60 MIN NO REPORT    Intraoperative interpretation by procedural physician at [place].    Standing Status:   Standing    Number of Occurrences:   1    Order Specific Question:   Reason for exam:    Answer:   Assistance in needle guidance and placement for procedures requiring needle placement in or near specific anatomical locations not easily accessible without such assistance.   Informed Consent Details: Physician/Practitioner Attestation; Transcribe to consent form and obtain patient signature    Note: Always confirm laterality of pain with Ms. Brigitte Pulse, before procedure. Transcribe to consent form and obtain patient signature.    Order Specific Question:   Physician/Practitioner attestation of informed consent for procedure/surgical case    Answer:   I, the physician/practitioner, attest that I have discussed with the patient the benefits, risks, side effects, alternatives, likelihood of achieving goals and potential problems during recovery for the procedure that I have provided informed consent.    Order Specific Question:   Procedure    Answer:   Bilateral intra-articular knee arthrocentesis (aspiration and/or injection)    Order Specific Question:   Physician/Practitioner performing the procedure    Answer:   Maxamus Colao A. [name], MD    Order Specific Question:   Indication/Reason    Answer:   Chronic bilateral knee pain secondary to knee arthropathy/arthralgia   Provide equipment / supplies at bedside    "Block Tray" (Disposable  single use) Needle type: SpinalSpinal Amount/quantity: 2 Size: Regular (3.5-inch) Gauge: 22G    Standing Status:   Standing    Number of Occurrences:   1    Order Specific Question:   Specify    Answer:   Block Tray   Miscellanous precautions    Standing Status:   Standing    Number of Occurrences:   1    Miscellanous precautions    NOTE: Although It is true that patients can have allergies to shellfish and that shellfish contain iodine, most shellfish  allergies are due to two protein allergens present in the shellfish: tropomyosins and parvalbumin. Not all patients with shellfish allergies are allergic to iodine. However, as a precaution, avoid using iodine containing products.    Standing Status:   Standing    Number of Occurrences:   1      Time-out: 1006 I initiated and conducted the "Time-out" before starting the procedure, as per protocol. The patient was asked to participate by confirming the accuracy of the "Time Out" information. Verification of the correct person, site, and procedure were performed and confirmed by me, the nursing staff, and the patient. "Time-out" conducted as per Joint Commission's Universal Protocol (UP.01.01.01). Procedure checklist: Completed   H&P (Pre-op  Assessment)  Karen Lewis is a 50 y.o. (year old), female patient, seen today for interventional treatment. She  has a past surgical history that includes Laparoscopic partial gastrectomy; Shoulder arthroscopy (Right); Carpal tunnel release (Bilateral); Diagnostic laparoscopy; Cholecystectomy; Trigger finger release (Right); Thyroidectomy (N/A, 11/12/2015); left trigger finger; Roux-en-Y Gastric Bypass (06/03/2017); Hiatal hernia repair; peniculectomy (N/A, 07/05/2018); Total hip arthroplasty (Right, 11/27/2019); Joint replacement (Bilateral, hip); Appendectomy; Trigger finger release (Right, 04/24/2020); Colonoscopy with propofol (N/A, 02/11/2021); and Esophagogastroduodenoscopy (egd) with propofol (N/A, 02/11/2021). Karen Lewis has a current medication list which includes the  following prescription(s): accu-chek aviva plus, acetaminophen, acidophilus, albuterol, amitriptyline, vitamin c, benzonatate, biotin, butalbital-acetaminophen-caffeine, calcium carbonate, clonazepam, b-12, diclofenac sodium, diphenhydramine, dupixent, emgality,  epinephrine, ergocalciferol, famotidine, fluticasone furoate-vilanterol, furosemide, accu-chek aviva plus, guaifenesin-codeine, levocetirizine, magnesium oxide, meclizine, metformin, metoprolol tartrate, montelukast, tab-a-vite, naloxone, naphazoline-pheniramine, nitrofurantoin (macrocrystal-monohydrate), omeprazole, oxycodone, [START ON 08/24/2021] oxycodone, [START ON 09/23/2021] oxycodone, ozempic (0.25 or 0.5 mg/dose), quetiapine, rizatriptan, rizatriptan, rosuvastatin, sertraline, spironolactone, tizanidine, topiramate, zolpidem, zonisamide, cefuroxime, fluconazole, levothyroxine, lisinopril, oxycodone, and pregabalin. Her primarily concern today is the Knee Pain  She is allergic to bactrim [sulfamethoxazole-trimethoprim], omalizumab, ciprofloxacin, shellfish allergy, and nsaids.   Last encounter: My last encounter with her was on 07/02/2021. Pertinent problems: Karen Lewis has Fibromyalgia; Chronic knee pain (1ry area of Pain) (Bilateral) (R>L); Chronic low back pain (3ry area of Pain) (Bilateral) (R>L) w/o sciatica; Lumbar facet syndrome (Bilateral) (R>L); Secondary osteoarthritis of multiple sites; Grade 1 (1.4 cm) Anterolisthesis of L4 over L5; Chronic hip pain (2ry area of Pain) (Right); S/P THR (total hip replacement) (Left); Lumbar spondylosis; Chronic pain syndrome; Neurogenic pain; Upper extremity pain; Chronic shoulder pain (Left); Osteoarthritis of shoulder (Left); Osteoarthritis of hip (Right); Secondary Osteoarthritis of knee (Bilateral) (R>L); Spondylosis without myelopathy or radiculopathy, lumbosacral region; Panniculitis; Leg swelling; Edema of both legs; History of total hip replacement (Bilateral); S/P THR (total hip replacement) (Right) (11/27/2019); Pain and numbness of left upper extremity; Cervical radiculitis (Left); DDD (degenerative disc disease), lumbosacral; and Osteoarthritis of lumbar spine without myelopathy or radiculopathy on their pertinent problem list. Pain Assessment:  Severity of Chronic pain is reported as a 5  (left knee is worse)/10. Location: Knee Right, Left, Medial, Lateral, Anterior/Left: Knee pain radiates from mid knee to outer knee down lower mid leg up side of upper thigh. Right: Pain stays in the middle area (numbness in upper right outer thigh). Onset: More than a month ago. Quality: Constant, Numbness, Aching, Burning. Timing: Constant. Modifying factor(s): Rest, streching, pain meds, lidocain cream. Vitals:  height is 5\' 4"  (1.626 m) and weight is 352 lb (159.7 kg) (abnormal). Her temporal temperature is 97.2 F (36.2 C) (abnormal). Her blood pressure is 107/81 and her pulse is 87. Her respiration is 18 and oxygen saturation is 97%.   Reason for encounter: "interventional pain management therapy due pain of at least four (4) weeks in duration, with failure to respond and/or inability to tolerate more conservative care.   Site Confirmation: Karen Lewis was asked to confirm the procedure and laterality before marking the site.  Consent: Before the procedure and under the influence of no sedative(s), amnesic(s), or anxiolytics, the patient was informed of the treatment options, risks and possible complications. To fulfill our ethical and legal obligations, as recommended by the American Medical Association's Code of Ethics, I have informed the patient of my clinical impression; the nature and purpose of the treatment or procedure; the risks, benefits, and possible complications of the intervention; the alternatives, including doing nothing; the risk(s) and benefit(s) of the alternative treatment(s) or procedure(s); and the risk(s) and benefit(s) of doing nothing. The patient was provided information about the general risks and possible complications associated with the procedure. These may include, but are not limited to: failure to achieve desired goals, infection, bleeding, organ or nerve damage, allergic reactions, paralysis, and death. In addition, the  patient was informed of those risks and complications associated to Spine-related procedures, such as failure to decrease pain; infection (i.e.: Meningitis, epidural or intraspinal abscess); bleeding (i.e.: epidural hematoma, subarachnoid hemorrhage, or any other type of intraspinal or  peri-dural bleeding); organ or nerve damage (i.e.: Any type of peripheral nerve, nerve root, or spinal cord injury) with subsequent damage to sensory, motor, and/or autonomic systems, resulting in permanent pain, numbness, and/or weakness of one or several areas of the body; allergic reactions; (i.e.: anaphylactic reaction); and/or death. Furthermore, the patient was informed of those risks and complications associated with the medications. These include, but are not limited to: allergic reactions (i.e.: anaphylactic or anaphylactoid reaction(s)); adrenal axis suppression; blood sugar elevation that in diabetics may result in ketoacidosis or comma; water retention that in patients with history of congestive heart failure may result in shortness of breath, pulmonary edema, and decompensation with resultant heart failure; weight gain; swelling or edema; medication-induced neural toxicity; particulate matter embolism and blood vessel occlusion with resultant organ, and/or nervous system infarction; and/or aseptic necrosis of one or more joints. Finally, the patient was informed that Medicine is not an exact science; therefore, there is also the possibility of unforeseen or unpredictable risks and/or possible complications that may result in a catastrophic outcome. The patient indicated having understood very clearly. We have given the patient no guarantees and we have made no promises. Enough time was given to the patient to ask questions, all of which were answered to the patient's satisfaction. Karen Lewis has indicated that she wanted to continue with the procedure. Attestation: I, the ordering provider, attest that I have discussed  with the patient the benefits, risks, side-effects, alternatives, likelihood of achieving goals, and potential problems during recovery for the procedure that I have provided informed consent.  Date  Time: 08/04/2021  9:10 AM   Prophylactic antibiotics  Anti-infectives (From admission, onward)    None      Indication(s): None identified   Description of procedure   Start Time: 1006 hrs  Local Anesthesia: Once the patient was positioned, prepped, and time-out was completed. The target area was identified located. The skin was marked with an approved surgical skin marker. Once marked, the skin (epidermis, dermis, and hypodermis), and deeper tissues (fat, connective tissue and muscle) were infiltrated with a small amount of a short-acting local anesthetic, loaded on a 10cc syringe with a 25G, 1.5-in  Needle. An appropriate amount of time was allowed for local anesthetics to take effect before proceeding to the next step. Local Anesthetic: Lidocaine 1-2% The unused portion of the local anesthetic was discarded in the proper designated containers. Safety Precautions: Aspiration looking for blood return was conducted prior to all injections. At no point did I inject any substances, as a needle was being advanced. Before injecting, the patient was told to immediately notify me if she was experiencing any new onset of "ringing in the ears, or metallic taste in the mouth". No attempts were made at seeking any paresthesias. Safe injection practices and needle disposal techniques used. Medications properly checked for expiration dates. SDV (single dose vial) medications used. After the completion of the procedure, all disposable equipment used was discarded in the proper designated medical waste containers.  Technical description: Protocol guidelines were followed. After positioning, the target area was identified and prepped in the usual manner. Skin & deeper tissues infiltrated with local anesthetic.  Appropriate amount of time allowed to pass for local anesthetics to take effect. Proper needle placement secured. Once satisfactory needle placement was confirmed, I proceeded to inject the desired solution in slow, incremental fashion, intermittently assessing for discomfort or any signs of abnormal or undesired spread of substance. Once completed, the needle was removed and disposed of,  as per hospital protocols. The area was cleaned, making sure to leave some of the prepping solution back to take advantage of its long term bactericidal properties.  Aspiration:  Negative   Vitals:   08/04/21 1010 08/04/21 1013 08/04/21 1014 08/04/21 1020  BP: 90/71 (!) 103/48 99/75 107/81  Pulse:      Resp: 20 (!) 23 16 18   Temp:      TempSrc:      SpO2: 99% 100% 100% 97%  Weight:      Height:        End Time: 1012 hrs   Imaging guidance  Imaging-assisted Technique: Fluoroscopy Guidance (Non-spinal) Indication(s): Morbid obesity. Assistance in needle guidance and placement for procedures requiring needle placement in or near specific anatomical locations impossible to access without such assistance. Exposure Time: Please see nurses notes. Contrast: None Fluoroscopic Guidance: I was personally present in the fluoroscopy suite, where the patient was placed in position for the procedure, over the fluoroscopy-compatible table. Fluoroscopy was manipulated, using "Tunnel Vision Technique", to obtain the best possible view of the target area, on the affected side. Parallax error was corrected before commencing the procedure. A "direction-depth-direction" technique was used to introduce the needle under continuous pulsed fluoroscopic guidance. Once the target was reached, antero-posterior, oblique, and lateral fluoroscopic projection views were taken to confirm needle placement in all planes. Electronic images uploaded into EMR. Ultrasound Guidance: N/A Interpretation: Intraoperative imaging interpretation by  performing Physician.   Post-op assessment  Post-procedure Vital Signs:  Pulse/HCG Rate: 8778 Temp: (!) 97.2 F (36.2 C) Resp: 18 BP: 107/81 SpO2: 97 %  EBL: None  Complications: No immediate post-treatment complications observed by team, or reported by patient.  Note: The patient tolerated the entire procedure well. A repeat set of vitals were taken after the procedure and the patient was kept under observation following institutional policy, for this type of procedure. Post-procedural neurological assessment was performed, showing return to baseline, prior to discharge. The patient was provided with post-procedure discharge instructions, including a section on how to identify potential problems. Should any problems arise concerning this procedure, the patient was given instructions to immediately contact us, at any time, without hesitation. In any case, we plan to contact the patient by telephone for a follow-up status report regarding this interventional procedure.  Comments:  No additional relevant information.   Plan of care  Chronic Opioid Analgesic:  Oxycodone IR 5 mg 1 tab PO 5X/day (#150/mo) (25 mg/day) MME/day: 37.5 mg/day.   Medications administered: We administered lidocaine, lactated ringers, midazolam, Triamcinolone Acetonide, Triamcinolone Acetonide, lidocaine HCl (PF), and ropivacaine (PF) 2 mg/mL (0.2%).  Follow-up plan:   Return in about 2 weeks (around 08/18/2021) for Proc-day (T,Th), (VV), (PPE).      Interventional Therapies  Risk  Complexity Considerations:   Estimated body mass index is 60.59 kg/m as calculated from the following:   Height as of 06/18/21: 5\' 4"  (1.626 m).   Weight as of 06/18/21: 353 lb (160.1 kg). NOTE: NO RFA until BMI less or equal to 35.  (Gastric bypass done on 06/03/2017) Iodine allergy  Contrast dye allergy  Shellfish allergy    Planned  Pending:   Pending further evaluation   Under consideration:   Diagnostic bilateral  genicular NB    Completed:   Therapeutic bilateral IA Zilretta knee injection x1 (02/19/2021) (100/100/100/100 x3.5 months)  Therapeutic bilateral IA Hyalgan knee injection x16 (06/24/2020)  Therapeutic bilateral THR (total hip replacements) (Dr. Rudene Christians) (Right: 11/27/2019) (Left: 07/29/2015)  Palliative/therapeutic bilateral  lumbar facet MBB x6 (06/18/2021) (100/100/75/75)  Therapeutic right IA hip injection x3 (07/07/2017)  Therapeutic left IA shoulder (glenohumeral joint) injection x1 (05/31/2017)    Therapeutic  Palliative (PRN) options:   Palliative bilateral knee injections  Palliative bilateral lumbar facet MBBs      Recent Visits Date Type Provider Dept  07/02/21 Office Visit Milinda Pointer, MD [facility]  06/18/21 Procedure visit Milinda Pointer, MD [facility]  06/03/21 Office Visit Milinda Pointer, MD [facility]  Showing recent visits within past 90 days and meeting all other requirements Today's Visits Date Type Provider Dept  08/04/21 Procedure visit Milinda Pointer, MD [facility]  Showing today's visits and meeting all other requirements Future Appointments Date Type Provider Dept  08/18/21 Appointment Milinda Pointer, [facility]  10/21/21 Appointment Milinda Pointer, MD [facility]  Showing future appointments within next 90 days and meeting all other requirements  Disposition: Discharge home  Discharge (Date  Time): 08/04/2021; 1025 hrs.   Primary Care Physician: Idelle Crouch, MD Location: [facility] Note by: Gaspar Cola, MD Date: 08/04/2021; Time: 12:25 PM  DISCLAIMER: Medicine is not an Chief Strategy Officer. It has no guarantees or warranties. The decision to proceed with this intervention was based on the information collected from the patient. Conclusions were drawn from the patient's questionnaire, interview, and examination. Because  information was provided in large part by the patient, it cannot be guaranteed that it has not been purposely or unconsciously manipulated or altered. Every effort has been made to obtain as much accurate, relevant, available data as possible. Always take into account that the treatment will also be dependent on availability of resources and existing treatment guidelines, considered by other Pain Management Specialists as being common knowledge and practice, at the time of the intervention. It is also important to point out that variation in procedural techniques and pharmacological choices are the acceptable norm. For Medico-Legal review purposes, the indications, contraindications, technique, and results of the these procedures should only be evaluated, judged and interpreted by a Board-Certified Interventional Pain Specialist with extensive familiarity and expertise in the same exact procedure and technique.

## 2021-08-04 NOTE — Progress Notes (Signed)
1030 patient taken downstairs to urology for scheduled appointment with Alphonse Guild NT. Report given to staff that patient has had IV versed and a knee injection with xylocaine.

## 2021-08-04 NOTE — Progress Notes (Signed)
IM Injection  Patient is present today for an IM Injection for treatment of rUTI. Drug: Ceftriaxone Dose:1g Location:Right ventrogluteal Lot: [id number] Exp:07/14/2023 Patient tolerated well, no complications were noted  Performed by: Bradly Bienenstock CMA

## 2021-08-05 ENCOUNTER — Telehealth: Payer: Self-pay

## 2021-08-05 LAB — URINALYSIS, COMPLETE
Bilirubin, UA: NEGATIVE
Specific Gravity, UA: 1.015 (ref 1.005–1.030)
pH, UA: 6.5 (ref 5.0–7.5)

## 2021-08-05 LAB — MICROSCOPIC EXAMINATION: WBC, UA: >30 /hpf — AB (ref 0–5)

## 2021-08-05 NOTE — Telephone Encounter (Signed)
Called PP. Denies any needs at this time. Instructed to call if needed. 

## 2021-08-10 ENCOUNTER — Ambulatory Visit (INDEPENDENT_AMBULATORY_CARE_PROVIDER_SITE_OTHER): Payer: Medicare HMO | Admitting: Obstetrics & Gynecology

## 2021-08-10 ENCOUNTER — Encounter: Payer: Self-pay | Admitting: Obstetrics & Gynecology

## 2021-08-10 ENCOUNTER — Other Ambulatory Visit (HOSPITAL_COMMUNITY)
Admission: RE | Admit: 2021-08-10 | Discharge: 2021-08-10 | Disposition: A | Discharge status: Home / Self Care | Payer: Medicare HMO | Source: Ambulatory Visit | Attending: Obstetrics & Gynecology | Admitting: Obstetrics & Gynecology

## 2021-08-10 ENCOUNTER — Other Ambulatory Visit: Payer: Self-pay

## 2021-08-10 VITALS — BP 128/80 | Ht 64.0 in | Wt 343.0 lb

## 2021-08-10 DIAGNOSIS — Z13820 Encounter for screening for osteoporosis: Secondary | ICD-10-CM

## 2021-08-10 DIAGNOSIS — Z124 Encounter for screening for malignant neoplasm of cervix: Secondary | ICD-10-CM | POA: Insufficient documentation

## 2021-08-10 DIAGNOSIS — Z01419 Encounter for gynecological examination (general) (routine) without abnormal findings: Secondary | ICD-10-CM | POA: Diagnosis not present

## 2021-08-10 LAB — CULTURE, URINE COMPREHENSIVE

## 2021-08-10 NOTE — Patient Instructions (Signed)
Recommendations to boost your immunity to prevent illness such as viral flu and colds, including covid19, are as follows:       - - -  Vitamin K2 and Vitamin D3  - - - Take Vitamin K2 at 200-300 mcg daily (usually 2-3 pills daily of the over the counter formulation). Take Vitamin D3 at 3000-4000 U daily (usually 3-4 pills daily of the over the counter formulation). Studies show that these two at high normal levels in your system are very effective in keeping your immunity so strong and protective that you will be unlikely to contract viral illness such as those listed above.  Dr Kenton Kingfisher  Thank you for choosing [facility]. As part of our ongoing efforts to improve patient experience, we would appreciate your feedback. Please fill out the short survey that you will receive by mail or MyChart. Your opinion is important to [place]! -Dr Kenton Kingfisher

## 2021-08-10 NOTE — Progress Notes (Signed)
HPI:      Karen Lewis is a 50 y.o. G0P0000 who LMP was in the past, she presents today for her annual examination.  The patient has no complaints today other than continued Hirsutism despite spironolactone therapy/ The patient is sexually active. Herlast pap: approximate date 2020 and was normal and last mammogram: approximate date 11/2020 and was normal.  The patient does perform self breast exams.  There is no notable family history of breast or ovarian cancer in her family. The patient is not taking hormone replacement therapy. Patient denies post-menopausal vaginal bleeding.   The patient has regular exercise: yes. The patient denies current symptoms of depression.    GYN Hx: Last Colonoscopy: <1 year ago. Normal.  Last DEXA:  never  ago.    PMHx: Past Medical History:  Diagnosis Date   Anemia    Anginal pain ([place])    Anxiety    Arthralgia of hip 07/29/2015   Arthritis    Arthritis, degenerative 07/29/2015   Asthma    Cephalalgia 07/25/2014   Dependence on unknown drug ([place])    multiplt controlled drug dependence   Depression    Diabetes mellitus without complication ([place])    Difficult intubation    per pt needs small tube (has had abrasions from tube in past)   Dysrhythmia    PVC's   Eczema    Fibromyalgia    Gastritis    GERD (gastroesophageal reflux disease)    Gonalgia 07/29/2015   Overview:  Overview:  The patient has had bilateral intra-articular Hyalgan injections done on 07/16/2014 and although she seems to do well with this type of therapy, apparently her insurance company does not want to pay for they Hyalgan. On 11/27/2014 the patient underwent a bilateral genicular nerve block with excellent results. On 01/28/2015 she had a right knee genicular radiofrequency ablatio   Gout    H/O cardiovascular disorder 03/10/2015   H/O surgical procedure 12/05/2012   Overview:  LSG ([facility] - April 2013)     H/O thyroid disease 03/10/2015   Headache    Herpes    History of  artificial joint 07/29/2015   History of hiatal hernia    Hypertension    Hypomagnesemia    Hypothyroidism    IDA (iron deficiency anemia) 05/28/2019   LBP (low back pain) 07/29/2015   Neuromuscular disorder (HCC)    Obesity    PCOS (polycystic ovarian syndrome)    Primary osteoarthritis of both knees 07/29/2015   Sleep apnea    not using a Cpap machine at this time - most recent test mild apnea does not qualify for test (not OSA)   Thyroid nodule bilateral   Umbilical hernia    Past Surgical History:  Procedure Laterality Date   APPENDECTOMY     CARPAL TUNNEL RELEASE Bilateral    CHOLECYSTECTOMY     COLONOSCOPY WITH PROPOFOL N/A 02/11/2021   Procedure: COLONOSCOPY WITH PROPOFOL;  Surgeon: Toledo, Benay Pike, MD;  Location: [facility];  Service: Gastroenterology;  Laterality: N/A;   DIAGNOSTIC LAPAROSCOPY     ESOPHAGOGASTRODUODENOSCOPY (EGD) WITH PROPOFOL N/A 02/11/2021   Procedure: ESOPHAGOGASTRODUODENOSCOPY (EGD) WITH PROPOFOL;  Surgeon: Toledo, Benay Pike, MD;  Location: [facility];  Service: Gastroenterology;  Laterality: N/A;   HIATAL HERNIA REPAIR     JOINT REPLACEMENT Bilateral hip   LAPAROSCOPIC PARTIAL GASTRECTOMY     left trigger finger     peniculectomy N/A 07/05/2018   ROUX-EN-Y GASTRIC BYPASS  06/03/2017   SHOULDER ARTHROSCOPY Right  THYROIDECTOMY N/A 11/12/2015   Procedure: THYROIDECTOMY;  Surgeon: Clyde Canterbury, MD;  Location: [facility];  Service: ENT;  Laterality: N/A;   TOTAL HIP ARTHROPLASTY Right 11/27/2019   Procedure: TOTAL HIP ARTHROPLASTY ANTERIOR APPROACH;  Surgeon: Hessie Knows, MD;  Location: [facility];  Service: Orthopedics;  Laterality: Right;   TRIGGER FINGER RELEASE Right    TRIGGER FINGER RELEASE Right 04/24/2020   Procedure: Right ring and middle trigger release;  Surgeon: Hessie Knows, MD;  Location: [facility];  Service: Orthopedics;  Laterality: Right;   Family History  Problem Relation Age of Onset   Anxiety disorder Mother    Depression  Mother    Alcohol abuse Mother    Diabetes Mother    Hypertension Mother    Kidney cancer Mother    Sleep apnea Mother    Alcohol abuse Father    Anxiety disorder Father    Depression Father    Post-traumatic stress disorder Father    Kidney failure Father    COPD Father    Diabetes Father    Hypertension Father    Sleep apnea Father    Depression Brother    Diabetes Brother    Hypertension Brother    Sleep apnea Brother    Breast cancer Paternal Aunt    Bladder Cancer Neg Hx    Prostate cancer Neg Hx    Social History   Tobacco Use   Smoking status: Never   Smokeless tobacco: Never  Vaping Use   Vaping Use: Never used  Substance Use Topics   Alcohol use: No    Alcohol/week: 0.0 standard drinks   Drug use: No    Current Outpatient Medications:    ACCU-CHEK AVIVA PLUS test strip, , Disp: , Rfl:    acetaminophen (TYLENOL) 650 MG CR tablet, Take 650-1,300 mg by mouth every 8 (eight) hours as needed for pain., Disp: , Rfl:    acidophilus (RISAQUAD) CAPS capsule, Take 1 capsule by mouth daily., Disp: , Rfl:    albuterol (PROVENTIL HFA;VENTOLIN HFA) 108 (90 BASE) MCG/ACT inhaler, Inhale 2 puffs into the lungs every 6 (six) hours as needed for wheezing or shortness of breath., Disp: , Rfl:    amitriptyline (ELAVIL) 100 MG tablet, Take 50 mg by mouth at bedtime., Disp: , Rfl:    Ascorbic Acid (VITAMIN C) 1000 MG tablet, Take 1,000 mg by mouth daily. , Disp: , Rfl:    benzonatate (TESSALON) 100 MG capsule, Take 100 mg by mouth., Disp: , Rfl:    Biotin 10 MG CAPS, Take 10 mg by mouth daily. , Disp: , Rfl:    butalbital-acetaminophen-caffeine (FIORICET) 50-325-40 MG tablet, Take 1 tablet by mouth every 6 (six) hours as needed for migraine. , Disp: , Rfl:    calcium carbonate (TUMS EX) 750 MG chewable tablet, Chew 1 tablet by mouth daily. , Disp: , Rfl:    clonazePAM (KLONOPIN) 0.5 MG tablet, Take 1 tablet (0.5 mg total) by mouth 3 (three) times daily as needed for anxiety., Disp:  30 tablet, Rfl: 0   Cyanocobalamin (B-12) 2500 MCG TABS, Take 2,500 mcg by mouth daily., Disp: , Rfl:    diclofenac sodium (VOLTAREN) 1 % GEL, Apply 2 g topically 4 (four) times daily. (Patient taking differently: Apply 2 g topically 3 (three) times daily as needed (pain).), Disp: 100 g, Rfl: 1   diphenhydrAMINE (BENADRYL) 25 MG tablet, Take 25 mg by mouth every 8 (eight) hours as needed for itching or allergies. , Disp: , Rfl:  DUPIXENT 300 MG/2ML SOPN, , Disp: , Rfl:    EMGALITY 120 MG/ML SOAJ, Inject into the skin., Disp: , Rfl:    EPINEPHrine 0.3 mg/0.3 mL IJ SOAJ injection, Inject 0.3 mg into the muscle as needed for anaphylaxis. , Disp: , Rfl:    ergocalciferol (VITAMIN D2) 1.25 MG (50000 UT) capsule, Take 50,000 Units by mouth 2 (two) times a week. , Disp: , Rfl:    famotidine (PEPCID) 20 MG tablet, Take 20 mg by mouth at bedtime., Disp: , Rfl:    fluticasone furoate-vilanterol (BREO ELLIPTA) 200-25 MCG/INH AEPB, Inhale 1 puff into the lungs daily., Disp: , Rfl:    furosemide (LASIX) 20 MG tablet, Take 20 mg by mouth daily as needed., Disp: , Rfl:    glucose blood (ACCU-CHEK AVIVA PLUS) test strip, Use 2 (two) times daily, Disp: , Rfl:    guaiFENesin-codeine (ROBITUSSIN AC) 100-10 MG/5ML syrup, Take by mouth., Disp: , Rfl:    levocetirizine (XYZAL) 5 MG tablet, Take 5 mg by mouth at bedtime. , Disp: , Rfl:    magnesium oxide (MAG-OX) 400 MG tablet, Take 1,200 mg by mouth 2 (two) times daily. Take 1200 mg by mouth in the morning and 1200 mg at bedtime, Disp: , Rfl:    meclizine (ANTIVERT) 25 MG tablet, Take 25 mg by mouth 3 (three) times daily as needed for dizziness. , Disp: , Rfl:    metFORMIN (GLUCOPHAGE) 1000 MG tablet, Take 1,000 mg by mouth 2 (two) times daily with a meal. , Disp: , Rfl:    metoprolol tartrate (LOPRESSOR) 50 MG tablet, Take 75 mg by mouth 2 (two) times daily. , Disp: , Rfl:    montelukast (SINGULAIR) 10 MG tablet, Take 10 mg by mouth at bedtime. , Disp: , Rfl:     Multiple Vitamin (TAB-A-VITE) TABS, , Disp: , Rfl:    naloxone (NARCAN) nasal spray 4 mg/0.1 mL, Place 1 spray into the nose as needed for up to 365 doses (for opioid-induced respiratory depresssion). In case of emergency (overdose), spray once into each nostril. If no response within 3 minutes, repeat application and call 008., Disp: 1 each, Rfl: 0   naphazoline-pheniramine (NAPHCON-A) 0.025-0.3 % ophthalmic solution, Place 1 drop into both eyes 4 (four) times daily as needed for eye irritation., Disp: , Rfl:    nitrofurantoin, macrocrystal-monohydrate, (MACROBID) 100 MG capsule, Take 1 capsule (100 mg total) by mouth daily., Disp: 30 capsule, Rfl: 11   omeprazole (PRILOSEC) 40 MG capsule, Take 40 mg by mouth in the morning and at bedtime., Disp: , Rfl:    oxyCODONE (OXY IR/ROXICODONE) 5 MG immediate release tablet, Take 1 tablet (5 mg total) by mouth 5 (five) times daily. Must last 30 days, Disp: 150 tablet, Rfl: 0   [START ON 08/24/2021] oxyCODONE (OXY IR/ROXICODONE) 5 MG immediate release tablet, Take 1 tablet (5 mg total) by mouth 5 (five) times daily. Must last 30 days, Disp: 150 tablet, Rfl: 0   [START ON 09/23/2021] oxyCODONE (OXY IR/ROXICODONE) 5 MG immediate release tablet, Take 1 tablet (5 mg total) by mouth 5 (five) times daily. Must last 30 days, Disp: 150 tablet, Rfl: 0   OZEMPIC, 0.25 OR 0.5 MG/DOSE, 2 MG/1.5ML SOPN, Inject 1 mg into the skin., Disp: , Rfl:    QUEtiapine (SEROQUEL) 100 MG tablet, Take 100 mg by mouth at bedtime., Disp: , Rfl:    rizatriptan (MAXALT) 10 MG tablet, Take 1 and may repeat in 2 hours for MIgraines, alternate with the disintrgrating tablet., Disp: ,  Rfl:    rizatriptan (MAXALT-MLT) 10 MG disintegrating tablet, Take 10 mg by mouth. Take 1 and may repeat in 2 hours  for Migraines, Disp: , Rfl:    rosuvastatin (CRESTOR) 40 MG tablet, Take 40 mg by mouth daily. , Disp: , Rfl:    sertraline (ZOLOFT) 100 MG tablet, Take 200 mg by mouth daily. , Disp: , Rfl:     spironolactone (ALDACTONE) 25 MG tablet, Take 37.5 mg by mouth 2 (two) times daily. , Disp: , Rfl:    tiZANidine (ZANAFLEX) 4 MG tablet, Take 1 tablet (4 mg total) by mouth 2 (two) times daily as needed for muscle spasms., Disp: 30 tablet, Rfl: 0   topiramate (TOPAMAX) 100 MG tablet, Take 200 mg by mouth daily. Nightly, Disp: , Rfl:    zolpidem (AMBIEN) 10 MG tablet, Take 10 mg by mouth at bedtime as needed for sleep., Disp: , Rfl:    zonisamide (ZONEGRAN) 50 MG capsule, Take 150 mg by mouth at bedtime., Disp: , Rfl:    levothyroxine (SYNTHROID) 125 MCG tablet, Take 200 mcg by mouth daily before breakfast., Disp: , Rfl:    lisinopril (ZESTRIL) 5 MG tablet, Take 5 mg by mouth at bedtime. , Disp: , Rfl:    oxyCODONE (OXY IR/ROXICODONE) 5 MG immediate release tablet, Take 1 tablet (5 mg total) by mouth 5 (five) times daily. Must last 30 days, Disp: 150 tablet, Rfl: 0   pregabalin (LYRICA) 225 MG capsule, Take 1 capsule (225 mg total) by mouth 2 (two) times daily., Disp: 180 capsule, Rfl: 0 Allergies: Bactrim [sulfamethoxazole-trimethoprim], Omalizumab, Ciprofloxacin, Shellfish allergy, and Nsaids  Review of Systems  Constitutional:  Negative for chills, fever and malaise/fatigue.  HENT:  Positive for congestion. Negative for sinus pain and sore throat.   Eyes:  Negative for blurred vision and pain.  Respiratory:  Negative for cough and wheezing.   Cardiovascular:  Negative for chest pain and leg swelling.  Gastrointestinal:  Negative for abdominal pain, constipation, diarrhea, heartburn, nausea and vomiting.  Genitourinary:  Negative for dysuria, frequency, hematuria and urgency.  Musculoskeletal:  Negative for back pain, joint pain, myalgias and neck pain.  Skin:  Negative for itching and rash.  Neurological:  Negative for dizziness, tremors and weakness.  Endo/Heme/Allergies:  Does not bruise/bleed easily.  Psychiatric/Behavioral:  Negative for depression. The patient is not nervous/anxious and  does not have insomnia.    Objective: BP 128/80   Ht 5\' 4"  (1.626 m)   Wt (!) 343 lb (155.6 kg)   BMI 58.88 kg/m   Filed Weights   08/10/21 1402  Weight: (!) 343 lb (155.6 kg)   Body mass index is 58.88 kg/m. Physical Exam Constitutional:      General: She is not in acute distress.    Appearance: She is well-developed. She is obese.  Genitourinary:     Bladder, rectum and urethral meatus normal.     No lesions in the vagina.     Right Labia: No rash, tenderness or lesions.    Left Labia: No tenderness, lesions or rash.    No vaginal bleeding.      Right Adnexa: not tender and no mass present.    Left Adnexa: not tender and no mass present.    No cervical motion tenderness, friability, lesion or polyp.     Uterus is not enlarged.     No uterine mass detected.    Pelvic exam was performed with patient in the lithotomy position.  Breasts:  Right: No mass, skin change or tenderness.     Left: No mass, skin change or tenderness.  HENT:     Head: Normocephalic and atraumatic. No laceration.     Right Ear: Hearing normal.     Left Ear: Hearing normal.     Mouth/Throat:     Pharynx: Uvula midline.  Eyes:     Pupils: Pupils are equal, round, and reactive to light.  Neck:     Thyroid: No thyromegaly.  Cardiovascular:     Rate and Rhythm: Normal rate and regular rhythm.     Heart sounds: No murmur heard.   No friction rub. No gallop.  Pulmonary:     Effort: Pulmonary effort is normal. No respiratory distress.     Breath sounds: Normal breath sounds. No wheezing.  Abdominal:     General: Bowel sounds are normal. There is no distension.     Palpations: Abdomen is soft.     Tenderness: There is no abdominal tenderness. There is no rebound.  Musculoskeletal:        General: Normal range of motion.     Cervical back: Normal range of motion and neck supple.  Neurological:     Mental Status: She is alert and oriented to person, place, and time.     Cranial Nerves: No  cranial nerve deficit.  Skin:    General: Skin is warm and dry.  Psychiatric:        Judgment: Judgment normal.  Vitals reviewed.    Assessment: Annual Exam 1. Women's annual routine gynecological examination   2. Screening for cervical cancer   3. Screening for osteoporosis     Plan:            1.  Cervical Screening-  Pap smear done today Pt desires every other year  2. Breast screening- Exam annually and mammogram scheduled  3. Colonoscopy every 10 years, Hemoccult testing after age 51; UTD 2022  4. Labs managed by PCP  5. Counseling for hormonal therapy: none              6. FRAX - FRAX score for assessing the 10 year probability for fracture calculated and discussed today.  Based on age and score today, DEXA is scheduled.  RISKS- prior steroid use, obesity, gastric bypass.   7. Hirsutism, cont Spironolactone. Vaniqa discussed; declined as pt fears insurance will not cover.    F/U  Return in about 1 year (around 08/10/2022) for Annual.  Barnett Applebaum, MD, [facility], [facility] 08/10/2021  2:53 PM

## 2021-08-11 ENCOUNTER — Telehealth: Payer: Self-pay

## 2021-08-11 ENCOUNTER — Telehealth: Payer: Self-pay | Admitting: Urology

## 2021-08-11 DIAGNOSIS — N390 Urinary tract infection, site not specified: Secondary | ICD-10-CM

## 2021-08-11 MED ORDER — FOSFOMYCIN TROMETHAMINE 3 G PO PACK: 3.0000 g | PACK | Freq: Once | ORAL | 0 refills | Status: DC

## 2021-08-11 MED ORDER — FOSFOMYCIN TROMETHAMINE 3 G PO PACK: 3.0000 g | PACK | Freq: Once | ORAL | 0 refills | Status: AC

## 2021-08-11 NOTE — Telephone Encounter (Signed)
Patient called back and stated that she would like for her rx to go to [facility] on [address] in [place].

## 2021-08-11 NOTE — Telephone Encounter (Signed)
[facility] [place], [place]. Abx sent.

## 2021-08-11 NOTE — Telephone Encounter (Signed)
Patient would like ABX sent to [facility] with no goodrx coupon. Advised patient this medication would most likely not be covered by insurance but she would like to try anyway. Advised her to call back if medication is unaffordable.

## 2021-08-11 NOTE — Addendum Note (Signed)
Addended by: Evelina Bucy on: 08/11/2021 01:46 PM   Modules accepted: Orders

## 2021-08-11 NOTE — Telephone Encounter (Signed)
OK per DPR, LMOM advising patient of message below. The cheapest location near by to send fosfomycin will be [facility] with use of Goodrx coupon. Informed patient of this in VM and asked her to please return call to confirm.

## 2021-08-11 NOTE — Telephone Encounter (Signed)
-----   Message from Debroah Loop, [place] sent at 08/10/2021  5:46 PM EST ----- Unfortunately her culture grew bacteria that is resistant to the cefuroxime I prescribed. Recommend switching her to fosfomycin x1 dose. Please counsel her on cost and GoodRx coupons. ----- Message ----- From: Interface, [facility] Lab Results In Sent: 08/05/2021   5:37 AM EST To: Debroah Loop, PA-C

## 2021-08-12 LAB — CYTOLOGY - PAP: Diagnosis: NEGATIVE

## 2021-08-13 ENCOUNTER — Other Ambulatory Visit: Payer: Medicare HMO

## 2021-08-17 ENCOUNTER — Encounter: Payer: Self-pay | Admitting: Pain Medicine

## 2021-08-18 ENCOUNTER — Ambulatory Visit: Payer: Medicare HMO | Attending: Pain Medicine | Admitting: Pain Medicine

## 2021-08-18 ENCOUNTER — Other Ambulatory Visit: Payer: Self-pay

## 2021-08-18 DIAGNOSIS — M4310 Spondylolisthesis, site unspecified: Secondary | ICD-10-CM

## 2021-08-18 DIAGNOSIS — M153 Secondary multiple arthritis: Secondary | ICD-10-CM

## 2021-08-18 DIAGNOSIS — M47816 Spondylosis without myelopathy or radiculopathy, lumbar region: Secondary | ICD-10-CM | POA: Diagnosis not present

## 2021-08-18 DIAGNOSIS — G8929 Other chronic pain: Secondary | ICD-10-CM | POA: Diagnosis not present

## 2021-08-18 DIAGNOSIS — M25562 Pain in left knee: Secondary | ICD-10-CM

## 2021-08-18 DIAGNOSIS — M25561 Pain in right knee: Secondary | ICD-10-CM | POA: Diagnosis not present

## 2021-08-18 DIAGNOSIS — Z6841 Body Mass Index (BMI) 40.0 and over, adult: Secondary | ICD-10-CM

## 2021-08-18 DIAGNOSIS — M545 Low back pain, unspecified: Secondary | ICD-10-CM | POA: Diagnosis not present

## 2021-08-18 NOTE — Patient Instructions (Signed)
____________________________________________________________________________________________  General Risks and Possible Complications  Patient Responsibilities: It is important that you read this as it is part of your informed consent. It is our duty to inform you of the risks and possible complications associated with treatments offered to you. It is your responsibility as a patient to read this and to ask questions about anything that is not clear or that you believe was not covered in this document.  Patient's Rights: You have the right to refuse treatment. You also have the right to change your mind, even after initially having agreed to have the treatment done. However, under this last option, if you wait until the last second to change your mind, you may be charged for the materials used up to that point.  Introduction: Medicine is not an exact science. Everything in Medicine, including the lack of treatment(s), carries the potential for danger, harm, or loss (which is by definition: Risk). In Medicine, a complication is a secondary problem, condition, or disease that can aggravate an already existing one. All treatments carry the risk of possible complications. The fact that a side effects or complications occurs, does not imply that the treatment was conducted incorrectly. It must be clearly understood that these can happen even when everything is done following the highest safety standards.  No treatment: You can choose not to proceed with the proposed treatment alternative. The "PRO(s)" would include: avoiding the risk of complications associated with the therapy. The "CON(s)" would include: not getting any of the treatment benefits. These benefits fall under one of three categories: diagnostic; therapeutic; and/or palliative. Diagnostic benefits include: getting information which can ultimately lead to improvement of the disease or symptom(s). Therapeutic benefits are those associated with the  successful treatment of the disease. Finally, palliative benefits are those related to the decrease of the primary symptoms, without necessarily curing the condition (example: decreasing the pain from a flare-up of a chronic condition, such as incurable terminal cancer).  General Risks and Complications: These are associated to most interventional treatments. They can occur alone, or in combination. They fall under one of the following six (6) categories: no benefit or worsening of symptoms; bleeding; infection; nerve damage; allergic reactions; and/or death. No benefits or worsening of symptoms: In Medicine there are no guarantees, only probabilities. No healthcare provider can ever guarantee that a medical treatment will work, they can only state the probability that it may. Furthermore, there is always the possibility that the condition may worsen, either directly, or indirectly, as a consequence of the treatment. Bleeding: This is more common if the patient is taking a blood thinner, either prescription or over the counter (example: Goody Powders, Fish oil, Aspirin, Garlic, etc.), or if suffering a condition associated with impaired coagulation (example: Hemophilia, cirrhosis of the liver, low platelet counts, etc.). However, even if you do not have one on these, it can still happen. If you have any of these conditions, or take one of these drugs, make sure to notify your treating physician. Infection: This is more common in patients with a compromised immune system, either due to disease (example: diabetes, cancer, human immunodeficiency virus [HIV], etc.), or due to medications or treatments (example: therapies used to treat cancer and rheumatological diseases). However, even if you do not have one on these, it can still happen. If you have any of these conditions, or take one of these drugs, make sure to notify your treating physician. Nerve Damage: This is more common when the treatment is an invasive    one, but it can also happen with the use of medications, such as those used in the treatment of cancer. The damage can occur to small secondary nerves, or to large primary ones, such as those in the spinal cord and brain. This damage may be temporary or permanent and it may lead to impairments that can range from temporary numbness to permanent paralysis and/or brain death. Allergic Reactions: Any time a substance or material comes in contact with our body, there is the possibility of an allergic reaction. These can range from a mild skin rash (contact dermatitis) to a severe systemic reaction (anaphylactic reaction), which can result in death. Death: In general, any medical intervention can result in death, most of the time due to an unforeseen complication. ____________________________________________________________________________________________ ______________________________________________________________________  Preparing for Procedure with Sedation  NOTICE: Due to recent regulatory changes, starting on April 13, 2021, procedures requiring intravenous (IV) sedation will no longer be performed at the [facility].  These types of procedures are required to be performed at [facility] ambulatory surgery facility.  We are very sorry for the inconvenience.  Procedure appointments are limited to planned procedures: No Prescription Refills. No disability issues will be discussed. No medication changes will be discussed.  Instructions: Oral Intake: Do not eat or drink anything for at least 8 hours prior to your procedure. (Exception: Blood Pressure Medication. See below.) Transportation: A driver is required. You may not drive yourself after the procedure. Blood Pressure Medicine: Do not forget to take your blood pressure medicine with a sip of water the morning of the procedure. If your Diastolic (lower reading) is above 100 mmHg, elective cases will be cancelled/rescheduled. Blood thinners:  These will need to be stopped for procedures. Notify our staff if you are taking any blood thinners. Depending on which one you take, there will be specific instructions on how and when to stop it. Diabetics on insulin: Notify the staff so that you can be scheduled 1st case in the morning. If your diabetes requires high dose insulin, take only  of your normal insulin dose the morning of the procedure and notify the staff that you have done so. Preventing infections: Shower with an antibacterial soap the morning of your procedure. Build-up your immune system: Take 1000 mg of Vitamin C with every meal (3 times a day) the day prior to your procedure. Antibiotics: Inform the staff if you have a condition or reason that requires you to take antibiotics before dental procedures. Pregnancy: If you are pregnant, call and cancel the procedure. Sickness: If you have a cold, fever, or any active infections, call and cancel the procedure. Arrival: You must be in the facility at least 30 minutes prior to your scheduled procedure. Children: Do not bring children with you. Dress appropriately: Bring dark clothing that you would not mind if they get stained. Valuables: Do not bring any jewelry or valuables.  Reasons to call and reschedule or cancel your procedure: (Following these recommendations will minimize the risk of a serious complication.) Surgeries: Avoid having procedures within 2 weeks of any surgery. (Avoid for 2 weeks before or after any surgery). Flu Shots: Avoid having procedures within 2 weeks of a flu shots. (Avoid for 2 weeks before or after immunizations). Barium: Avoid having a procedure within 7-10 days after having had a radiological study involving the use of radiological contrast. (Myelograms, Barium swallow or enema study). Heart attacks: Avoid any elective procedures or surgeries for the initial 6 months after a "Myocardial Infarction" (Heart  Attack). Blood thinners: It is imperative that  you stop these medications before procedures. Let us know if you if you take any blood thinner.  Infection: Avoid procedures during or within two weeks of an infection (including chest colds or gastrointestinal problems). Symptoms associated with infections include: Localized redness, fever, chills, night sweats or profuse sweating, burning sensation when voiding, cough, congestion, stuffiness, runny nose, sore throat, diarrhea, nausea, vomiting, cold or Flu symptoms, recent or current infections. It is specially important if the infection is over the area that we intend to treat. Heart and lung problems: Symptoms that may suggest an active cardiopulmonary problem include: cough, chest pain, breathing difficulties or shortness of breath, dizziness, ankle swelling, uncontrolled high or unusually low blood pressure, and/or palpitations. If you are experiencing any of these symptoms, cancel your procedure and contact your primary care physician for an evaluation.  Remember:  Regular Business hours are:  Monday to Thursday 8:00 AM to 4:00 PM  Provider's Schedule: Milinda Pointer, MD:  Procedure days: Tuesday and Thursday 7:30 AM to 4:00 PM  Gillis Santa, MD:  Procedure days: Monday and Wednesday 7:30 AM to 4:00 PM ______________________________________________________________________

## 2021-08-18 NOTE — Progress Notes (Signed)
Patient: Karen Lewis  Service Category: E/M  Provider: Gaspar Cola, MD  DOB: 1971/03/20  DOS: 08/18/2021  Location: Office  MRN: [id number]  Setting: Ambulatory outpatient  Referring Provider: Idelle Crouch, MD  Type: Established Patient  Specialty: Interventional Pain Management  PCP: Idelle Crouch, MD  Location: Remote location  Delivery: TeleHealth     Virtual Encounter - Pain Management PROVIDER NOTE: Information contained herein reflects review and annotations entered in association with encounter. Interpretation of such information and data should be left to medically-trained personnel. Information provided to patient can be located elsewhere in the medical record under "Patient Instructions". Document created using STT-dictation technology, any transcriptional errors that may result from process are unintentional.    Contact & Pharmacy Preferred: [phone] Home: [phone] (home) Mobile: [phone] (mobile) E-mail: [email or web address]  [facility], [place] [address] [place] [address] Phone: [phone] Fax: [phone]  [id number] [address] [id number]: [phone]: [phone]  Pre-screening  Karen Lewis "in-person" vs "virtual" encounter. She indicated preferring virtual for this encounter.   Reason COVID-19*  Social distancing based on CDC and AMA recommendations.   I contacted Karen Charonon 08/18/2021 via telephone.      I clearly identified myself as FGaspar Cola MD. I verified that I was speaking with the correct person using two identifiers (Name: Karen Lewis and date of birth: [birth year].  Consent I sought verbal advanced consent from Karen Lewis virtual visit interactions. I informed [name] possible security and privacy concerns, risks, and limitations associated with providing "not-in-person"  medical evaluation and management services. I also informed Karen Lewis the availability of "in-person" appointments. Finally, I informed her that there would be a charge for the virtual visit and that she could be  personally, fully or partially, financially responsible for it. Ms. SKapferexpressed understanding and agreed to proceed.   Historic Elements   Karen Lewis a 50y.o. year old, female patient evaluated today after our last contact on 08/04/2021. Karen Lewis a past medical history of Anemia, Anginal pain ([place], Anxiety, Arthralgia of hip (07/29/2015), Arthritis, Arthritis, degenerative (07/29/2015), Asthma, Cephalalgia (07/25/2014), Dependence on unknown drug ([place], Depression, Diabetes mellitus without complication ([place], Difficult intubation, Dysrhythmia, Eczema, Fibromyalgia, Gastritis, GERD (gastroesophageal reflux disease), Gonalgia (07/29/2015), Gout, H/O cardiovascular disorder (03/10/2015), H/O surgical procedure (12/05/2012), H/O thyroid disease (03/10/2015), Headache, Herpes, History of artificial joint (07/29/2015), History of hiatal hernia, Hypertension, Hypomagnesemia, Hypothyroidism, IDA (iron deficiency anemia) (05/28/2019), LBP (low back pain) (07/29/2015), Neuromuscular disorder ([place], Obesity, PCOS (polycystic ovarian syndrome), Primary osteoarthritis of both knees (07/29/2015), Sleep apnea, Thyroid nodule (bilateral), and Umbilical hernia. She also  Lewis a past surgical history that includes Laparoscopic partial gastrectomy; Shoulder arthroscopy (Right); Carpal tunnel release (Bilateral); Diagnostic laparoscopy; Cholecystectomy; Trigger finger release (Right); Thyroidectomy (N/A, 11/12/2015); left trigger finger; Roux-en-Y Gastric Bypass (06/03/2017); Hiatal hernia repair; peniculectomy (N/A, 07/05/2018); Total hip arthroplasty (Right, 11/27/2019); Joint replacement (Bilateral, hip); Appendectomy; Trigger finger release (Right, 04/24/2020); Colonoscopy with propofol (N/A, 02/11/2021); and  Esophagogastroduodenoscopy (egd) with propofol (N/A, 02/11/2021). Ms. SMillironshas a current medication list which includes the following prescription(s): accu-chek aviva plus, acetaminophen, acidophilus, albuterol, amitriptyline, vitamin c, benzonatate, biotin, butalbital-acetaminophen-caffeine, calcium carbonate, clonazepam, b-12, diclofenac sodium, diphenhydramine, dupixent, emgality, epinephrine, ergocalciferol, famotidine, fluticasone furoate-vilanterol, furosemide, accu-chek aviva plus, guaifenesin-codeine, levocetirizine, magnesium oxide, meclizine, metformin, metoprolol  tartrate, montelukast, tab-a-vite, naloxone, naphazoline-pheniramine, nitrofurantoin (macrocrystal-monohydrate), omeprazole, oxycodone, [START ON 08/24/2021] oxycodone, [START ON 09/23/2021] oxycodone, ozempic (0.25 or 0.5 mg/dose), quetiapine, rizatriptan, rizatriptan, rosuvastatin, sertraline, spironolactone, tizanidine, topiramate, zolpidem, zonisamide, levothyroxine, lisinopril, oxycodone, and pregabalin. She  reports that she Lewis never smoked. She Lewis never used smokeless tobacco. She reports that she does not drink alcohol and does not use drugs. Karen Lewis is allergic to bactrim [sulfamethoxazole-trimethoprim], omalizumab, ciprofloxacin, shellfish allergy, and nsaids.   HPI  Today, she is being contacted for a post-procedure assessment.  The patient refers having attained a 100% relief of the pain for the duration of the local anesthetic followed by a 75-100% ongoing relief of her knee pain.  On the last visit she had talked to me about her low back pain beginning to return and she Lewis reminded me that that is still the case.  In the past we have done palliative bilateral lumbar facet blocks and today she Lewis indicated that again her primary area of pain is that of the lower back and although she does have some referred pain down the legs, but this goes just past the level of the knees but it does not get into her feet.  She confirms that  this is the typical lumbar facet pain that she normally gets.  Post-Procedure Evaluation  Procedure(s):   Procedure: ER-steroid (Zilretta) Intra-articular Knee Injection  No.: 2  Series: n/a Level/approach: Lateral Laterality: Bilateral (-50) Imaging guidance: Fluoroscopy required Analgesia: Local anesthesia Sedation: None.   Purpose: Diagnostic/Therapeutic Indications: Knee arthralgia associated to osteoarthritis of the knee  NAS-11 score:   Pre-procedure: 5  (left knee is worse)/10   Post-procedure: 1 /10     Anxiolysis: Please see nurses note.  Effectiveness during initial hour after procedure (Ultra-Short Term Relief): 100 %.  Local anesthetic used: Long-acting (4-6 hours) Effectiveness: Defined as any analgesic benefit obtained secondary to the administration of local anesthetics. This carries significant diagnostic value as to the etiological location, or anatomical origin, of the pain. Duration of benefit is expected to coincide with the duration of the local anesthetic used.  Effectiveness during initial 4-6 hours after procedure (Short-Term Relief): 100 %.  Long-term benefit: Defined as any relief past the pharmacologic duration of the local anesthetics.  Effectiveness past the initial 6 hours after procedure (Long-Term Relief): 75 %.  Benefits, current: Defined as benefit present at the time of this evaluation.   Analgesia: The patient refers an ongoing 75% relief of the pain but she wanted to point out that when she is in bed and not standing up she Lewis 100% relief of her pain.  She also indicated being able to bend the knees and enjoyed better range of motion. Function: Karen Lewis reports improvement in function ROM: Karen Lewis reports improvement in ROM  Pharmacotherapy Assessment   Analgesic: Oxycodone IR 5 mg 1 tab PO 5X/day (#150/mo) (25 mg/day) MME/day: 37.5 mg/day.   Monitoring: [place] PMP: PDMP reviewed during this encounter.       Pharmacotherapy: No side-effects  or adverse reactions reported. Compliance: No problems identified. Effectiveness: Clinically acceptable. Plan: Refer to "POC". UDS:  Summary  Date Value Ref Range Status  02/19/2020 Note  Final    Comment:    ==================================================================== [employer] Select 13 (MW) ==================================================================== Test                             Result       Flag  Units  Drug Present and Declared for Prescription Verification   7-aminoclonazepam              44           EXPECTED   ng/mg creat    7-aminoclonazepam is an expected metabolite of clonazepam. Source of    clonazepam is a scheduled prescription medication.  Drug Present not Declared for Prescription Verification   Oxycodone                      199          UNEXPECTED ng/mg creat   Oxymorphone                    127          UNEXPECTED ng/mg creat   Noroxycodone                   1026         UNEXPECTED ng/mg creat   Noroxymorphone                 56           UNEXPECTED ng/mg creat    Sources of oxycodone are scheduled prescription medications.    Oxymorphone, noroxycodone, and noroxymorphone are expected    metabolites of oxycodone. Oxymorphone is also available as a    scheduled prescription medication.  Drug Absent but Declared for Prescription Verification   Butalbital                     Not Detected UNEXPECTED ==================================================================== Test                      Result    Flag   Units      Ref Range   Creatinine              165              mg/dL      >=20 ==================================================================== Declared Medications:  The flagging and interpretation on this report are based on the  following declared medications.  Unexpected results may arise from  inaccuracies in the declared medications.   **Note: The testing scope of this panel includes these medications:   Butalbital  (Fioricet)  Clonazepam (Klonopin)   **Note: The testing scope of this panel does not include the  following reported medications:   Acetaminophen (Tylenol)  Acetaminophen (Fioricet)  Albuterol (Ventolin HFA)  Amitriptyline (Elavil)  Biotin  Caffeine (Fioricet)  Calcium (Tums)  Diphenhydramine (Benadryl)  Epinephrine (EpiPen)  Eye Drops  Famotidine (Pepcid)  Levocetirizine (Xyzal)  Levothyroxine (Synthroid)  Lisinopril (Zestril)  Magnesium (Mag-Ox)  Meclizine (Antivert)  Medroxyprogesterone (Depo-Provera)  Metformin (Glucophage)  Metoprolol (Lopressor)  Montelukast (Singulair)  Multivitamin  Naloxone (Narcan)  Pregabalin (Lyrica)  Probiotic  Quetiapine (Seroquel)  Rosuvastatin (Crestor)  Sertraline (Zoloft)  Spironolactone (Aldactone)  Tizanidine (Zanaflex)  Topical  Topical Diclofenac  Triamcinolone (Nasacort)  Vitamin B  Vitamin B12  Vitamin C  Vitamin D2  Zolpidem (Ambien)  Zonisamide (Zonegran) ==================================================================== For clinical consultation, please call [phone]. ====================================================================      Laboratory Chemistry Profile   Renal Lab Results  Component Value Date   BUN 19 04/28/2021   CREATININE 1.06 (H) 04/28/2021   GFRAA >60 04/16/2020   GFRNONAA >60 04/28/2021    Hepatic Lab Results  Component Value Date   AST 34 04/22/2021   ALT 34  04/22/2021   ALBUMIN 4.1 04/22/2021   ALKPHOS 63 04/22/2021    Electrolytes Lab Results  Component Value Date   NA 134 (L) 04/28/2021   K 4.5 04/28/2021   CL 101 04/28/2021   CALCIUM 8.4 (L) 04/28/2021   MG 2.1 04/28/2021   PHOS 5.5 (H) 05/22/2019    Bone Lab Results  Component Value Date   VD25OH 27.4 (L) 11/24/2015   VD125OH2TOT 41.5 11/24/2015    Inflammation (CRP: Acute Phase) (ESR: Chronic Phase) Lab Results  Component Value Date   CRP 0.7 09/27/2019   ESRSEDRATE 30 (H) 09/27/2019          Note: Above Lab results reviewed.  Imaging  DG PAIN CLINIC C-ARM 1-60 MIN NO REPORT Fluoro was used, but no Radiologist interpretation will be provided.  Please refer to "NOTES" tab for provider progress note.  Assessment  The primary encounter diagnosis was Chronic knee pain (1ry area of Pain) (Bilateral) (R>L). Diagnoses of Chronic low back pain (3ry area of Pain) (Bilateral) (R>L) w/o sciatica, Lumbar facet syndrome (Bilateral) (R>L), Grade 1 (1.4 cm) Anterolisthesis of L4 over L5, Secondary osteoarthritis of multiple sites, and Class 3 severe obesity due to excess calories with serious comorbidity and body mass index (BMI) of 60.0 to 69.9 in adult [facility]) were also pertinent to this visit.  Plan of Care  Problem-specific:  No problem-specific Assessment & Plan notes found for this encounter.  Karen Lewis Lewis a current medication list which includes the following long-term medication(s): albuterol, clonazepam, diphenhydramine, furosemide, magnesium oxide, metformin, metoprolol tartrate, montelukast, naloxone, oxycodone, [START ON 08/24/2021] oxycodone, [START ON 09/23/2021] oxycodone, rizatriptan, rizatriptan, spironolactone, zonisamide, levothyroxine, lisinopril, oxycodone, and pregabalin.  Pharmacotherapy (Medications Ordered): No orders of the defined types were placed in this encounter.  Orders:  Orders Placed This Encounter  Procedures   LUMBAR FACET(MEDIAL BRANCH NERVE BLOCK) MBNB    Standing Status:   Future    Standing Expiration Date:   11/16/2021    Scheduling Instructions:     Procedure: Lumbar facet block (AKA.: Lumbosacral medial branch nerve block)     Side: Bilateral     Level: L3-4, L4-5, & L5-S1 Facets (L2, L3, L4, L5, & S1 Medial Branch Nerves)     Sedation: Patient's choice.     Timeframe: ASAA    Order Specific Question:   Where will this procedure be performed?    Answer:   [facility]    Follow-up plan:   Return for (Clinic) procedure: (B)  L-FCT BLK.     Interventional Therapies  Risk  Complexity Considerations:   Estimated body mass index is 60.59 kg/m as calculated from the following:   Height as of 06/18/21: '5\' 4"'  (1.626 m).   Weight as of 06/18/21: 353 lb (160.1 kg). NOTE: NO RFA until BMI less or equal to 35.  (Gastric bypass done on 06/03/2017) Iodine allergy  Contrast dye allergy  Shellfish allergy    Planned  Pending:   Palliative/therapeutic bilateral lumbar facet MBB #7    Under consideration:   Diagnostic bilateral genicular NB    Completed:   Therapeutic bilateral IA Zilretta knee injection x2 (08/04/2021) (100/100/75/75)  Therapeutic bilateral IA Hyalgan knee injection x16 (06/24/2020)  Therapeutic bilateral THR (total hip replacements) (Dr. Rudene Christians) (Right: 11/27/2019) (Left: 07/29/2015)  Palliative/therapeutic bilateral lumbar facet MBB x6 (06/18/2021) (100/100/75/75)  Therapeutic right IA hip injection x3 (07/07/2017)  Therapeutic left IA shoulder (glenohumeral joint) injection x1 (05/31/2017)    Therapeutic  Palliative (PRN) options:  Palliative bilateral knee injections  Palliative bilateral lumbar facet MBBs     Recent Visits Date Type Provider Dept  08/04/21 Procedure visit Milinda Pointer, MD [facility]  07/02/21 Office Visit Milinda Pointer, MD [facility]  06/18/21 Procedure visit Milinda Pointer, MD [facility]  06/03/21 Office Visit Milinda Pointer, MD [facility]  Showing recent visits within past 90 days and meeting all other requirements Today's Visits Date Type Provider Dept  08/18/21 Office Visit Milinda Pointer, MD [facility]  Showing today's visits and meeting all other requirements Future Appointments Date Type Provider Dept  10/21/21 Appointment Milinda Pointer, MD [facility]  Showing future appointments within next 90 days and meeting all other requirements I discussed the assessment and treatment  plan with the patient. The patient was provided an opportunity to ask questions and all were answered. The patient agreed with the plan and demonstrated an understanding of the instructions.  Patient advised to call back or seek an in-person evaluation if the symptoms or condition worsens.  Duration of encounter: 15 minutes.  Note by: Gaspar Cola, MD Date: 08/18/2021; Time: 9:37 AM

## 2021-08-19 ENCOUNTER — Encounter: Payer: Medicare HMO | Admitting: Pain Medicine

## 2021-08-19 ENCOUNTER — Encounter: Payer: Self-pay | Admitting: Obstetrics and Gynecology

## 2021-08-19 DIAGNOSIS — I152 Hypertension secondary to endocrine disorders: Secondary | ICD-10-CM | POA: Diagnosis not present

## 2021-08-19 DIAGNOSIS — R399 Unspecified symptoms and signs involving the genitourinary system: Secondary | ICD-10-CM | POA: Diagnosis not present

## 2021-08-19 DIAGNOSIS — E1169 Type 2 diabetes mellitus with other specified complication: Secondary | ICD-10-CM | POA: Diagnosis not present

## 2021-08-19 DIAGNOSIS — E1159 Type 2 diabetes mellitus with other circulatory complications: Secondary | ICD-10-CM | POA: Diagnosis not present

## 2021-08-19 DIAGNOSIS — E785 Hyperlipidemia, unspecified: Secondary | ICD-10-CM | POA: Diagnosis not present

## 2021-08-19 DIAGNOSIS — Z79899 Other long term (current) drug therapy: Secondary | ICD-10-CM | POA: Diagnosis not present

## 2021-08-25 ENCOUNTER — Other Ambulatory Visit: Payer: Self-pay

## 2021-08-25 ENCOUNTER — Ambulatory Visit (HOSPITAL_BASED_OUTPATIENT_CLINIC_OR_DEPARTMENT_OTHER): Payer: Medicare HMO | Admitting: Pain Medicine

## 2021-08-25 ENCOUNTER — Ambulatory Visit
Admission: RE | Admit: 2021-08-25 | Discharge: 2021-08-25 | Disposition: A | Discharge status: Home / Self Care | Payer: Medicare HMO | Source: Ambulatory Visit | Attending: Pain Medicine | Admitting: Pain Medicine

## 2021-08-25 ENCOUNTER — Encounter: Payer: Self-pay | Admitting: Pain Medicine

## 2021-08-25 VITALS — BP 113/81 | HR 93 | Temp 97.2°F | Resp 17 | Ht 64.0 in | Wt 354.0 lb

## 2021-08-25 DIAGNOSIS — M545 Low back pain, unspecified: Secondary | ICD-10-CM | POA: Insufficient documentation

## 2021-08-25 DIAGNOSIS — M47817 Spondylosis without myelopathy or radiculopathy, lumbosacral region: Secondary | ICD-10-CM | POA: Diagnosis not present

## 2021-08-25 DIAGNOSIS — Z888 Allergy status to other drugs, medicaments and biological substances status: Secondary | ICD-10-CM

## 2021-08-25 DIAGNOSIS — G8929 Other chronic pain: Secondary | ICD-10-CM

## 2021-08-25 DIAGNOSIS — M4310 Spondylolisthesis, site unspecified: Secondary | ICD-10-CM | POA: Insufficient documentation

## 2021-08-25 DIAGNOSIS — M47816 Spondylosis without myelopathy or radiculopathy, lumbar region: Secondary | ICD-10-CM | POA: Insufficient documentation

## 2021-08-25 DIAGNOSIS — M153 Secondary multiple arthritis: Secondary | ICD-10-CM | POA: Insufficient documentation

## 2021-08-25 DIAGNOSIS — M5137 Other intervertebral disc degeneration, lumbosacral region: Secondary | ICD-10-CM

## 2021-08-25 DIAGNOSIS — Z6841 Body Mass Index (BMI) 40.0 and over, adult: Secondary | ICD-10-CM | POA: Diagnosis not present

## 2021-08-25 MED ORDER — ROPIVACAINE HCL 2 MG/ML IJ SOLN
18.0000 mL | Freq: Once | INTRAMUSCULAR | Status: AC
  Administered 2021-08-25: 18 mL via PERINEURAL

## 2021-08-25 MED ORDER — LACTATED RINGERS IV SOLN
1000.0000 mL | Freq: Once | INTRAVENOUS | Status: AC
  Administered 2021-08-25: 1000 mL via INTRAVENOUS

## 2021-08-25 MED ORDER — TRIAMCINOLONE ACETONIDE 40 MG/ML IJ SUSP
80.0000 mg | Freq: Once | INTRAMUSCULAR | Status: AC
  Administered 2021-08-25: 80 mg
  Filled 2021-08-25: qty 2

## 2021-08-25 MED ORDER — MIDAZOLAM HCL 5 MG/5ML IJ SOLN
0.5000 mg | Freq: Once | INTRAMUSCULAR | Status: AC
  Administered 2021-08-25: 2 mg via INTRAVENOUS
  Filled 2021-08-25: qty 5

## 2021-08-25 MED ORDER — ROPIVACAINE HCL 2 MG/ML IJ SOLN
INTRAMUSCULAR | Status: AC
  Filled 2021-08-25: qty 20

## 2021-08-25 MED ORDER — LIDOCAINE HCL 2 % IJ SOLN
20.0000 mL | Freq: Once | INTRAMUSCULAR | Status: AC
  Administered 2021-08-25: 400 mg
  Filled 2021-08-25: qty 40

## 2021-08-25 NOTE — Patient Instructions (Signed)

## 2021-08-25 NOTE — Progress Notes (Signed)
Safety precautions to be maintained throughout the outpatient stay will include: orient to surroundings, keep bed in low position, maintain call bell within reach at all times, provide assistance with transfer out of bed and ambulation.  

## 2021-08-25 NOTE — Progress Notes (Signed)
PROVIDER NOTE: Information contained herein reflects review and annotations entered in association with encounter. Interpretation of such information and data should be left to medically-trained personnel. Information provided to patient can be located elsewhere in the medical record under "Patient Instructions". Document created using STT-dictation technology, any transcriptional errors that may result from process are unintentional.    Patient: Karen Lewis  Service Category: Procedure Provider: Gaspar Cola, MD DOB: Mar 05, 1971 DOS: 08/25/2021 Location: [facility] MRN: [id number] Setting: Ambulatory - outpatient Referring Provider: Milinda Pointer, MD Type: Established Patient Specialty: Interventional Pain Management PCP: Idelle Crouch, MD  Primary Reason for Visit: Interventional Pain Management Treatment. CC: Back Pain    Procedure:          Type: Lumbar Facet, Medial Branch Block(s)          Primary Purpose: Diagnostic/Therapeutic Region: Posterolateral Lumbosacral Spine Level: L2, L3, L4, L5, & S1 Medial Branch Level(s). Injecting these levels blocks the L3-4, L4-5, and L5-S1 lumbar facet joints. Laterality: Bilateral Anesthesia: Local (1-2% Lidocaine)  Anxiolysis: IV  Sedation: None  Guidance: Fluoroscopy          Indications: 1. Lumbar facet syndrome (Bilateral) (R>L)   2. Osteoarthritis of lumbar spine without myelopathy or radiculopathy   3. Spondylosis without myelopathy or radiculopathy, lumbosacral region   4. Grade 1 (1.4 cm) Anterolisthesis of L4 over L5   5. DDD (degenerative disc disease), lumbosacral   6. Chronic low back pain (3ry area of Pain) (Bilateral) (R>L) w/o sciatica   7. Class 3 severe obesity due to excess calories with serious comorbidity and body mass index (BMI) of 60.0 to 69.9 in adult ([place])   8. Allergy to iodine    Pain Score: Pre-procedure: 4 /10 Post-procedure: 5 /10     Position: Prone  Pre-op H&P Assessment:   Karen Lewis is a 50 y.o. (year old), female patient, seen today for interventional treatment. She  has a past surgical history that includes Laparoscopic partial gastrectomy; Shoulder arthroscopy (Right); Carpal tunnel release (Bilateral); Diagnostic laparoscopy; Cholecystectomy; Trigger finger release (Right); Thyroidectomy (N/A, 11/12/2015); left trigger finger; Roux-en-Y Gastric Bypass (06/03/2017); Hiatal hernia repair; peniculectomy (N/A, 07/05/2018); Total hip arthroplasty (Right, 11/27/2019); Joint replacement (Bilateral, hip); Appendectomy; Trigger finger release (Right, 04/24/2020); Colonoscopy with propofol (N/A, 02/11/2021); and Esophagogastroduodenoscopy (egd) with propofol (N/A, 02/11/2021). Karen Lewis has a current medication list which includes the following prescription(s): accu-chek aviva plus, acetaminophen, acidophilus, albuterol, amitriptyline, vitamin c, benzonatate, biotin, butalbital-acetaminophen-caffeine, calcium carbonate, clonazepam, b-12, diclofenac sodium, diphenhydramine, dupixent, emgality, epinephrine, ergocalciferol, famotidine, fluticasone furoate-vilanterol, furosemide, accu-chek aviva plus, guaifenesin-codeine, levocetirizine, levothyroxine, lisinopril, magnesium oxide, meclizine, metformin, metoprolol tartrate, montelukast, tab-a-vite, naloxone, naphazoline-pheniramine, nitrofurantoin (macrocrystal-monohydrate), omeprazole, oxycodone, [START ON 09/23/2021] oxycodone, ozempic (0.25 or 0.5 mg/dose), pregabalin, quetiapine, rizatriptan, rosuvastatin, sertraline, spironolactone, tizanidine, topiramate, zolpidem, zonisamide, oxycodone, oxycodone, and rizatriptan. Her primarily concern today is the Back Pain  Initial Vital Signs:  Pulse/HCG Rate: 93ECG Heart Rate: 91 Temp: (!) 97.2 F (36.2 C) Resp: 18 BP: 123/68 SpO2: 97 %  BMI: Estimated body mass index is 60.76 kg/m as calculated from the following:   Height as of this encounter: 5\' 4"  (1.626 m).   Weight as of this encounter: 354  lb (160.6 kg).  Risk Assessment: Allergies: Reviewed. She is allergic to bactrim [sulfamethoxazole-trimethoprim], omalizumab, ciprofloxacin, shellfish allergy, and nsaids.  Allergy Precautions: No iodine containing solutions or radiological contrast used. Coagulopathies: Reviewed. None identified.  Blood-thinner therapy: None at this time Active Infection(s): Reviewed. None identified. Karen Lewis is afebrile  Site Confirmation: Karen Lewis was asked to confirm the procedure and laterality before marking the site Procedure checklist: Completed Consent: Before the procedure and under the influence of no sedative(s), amnesic(s), or anxiolytics, the patient was informed of the treatment options, risks and possible complications. To fulfill our ethical and legal obligations, as recommended by the American Medical Association's Code of Ethics, I have informed the patient of my clinical impression; the nature and purpose of the treatment or procedure; the risks, benefits, and possible complications of the intervention; the alternatives, including doing nothing; the risk(s) and benefit(s) of the alternative treatment(s) or procedure(s); and the risk(s) and benefit(s) of doing nothing. The patient was provided information about the general risks and possible complications associated with the procedure. These may include, but are not limited to: failure to achieve desired goals, infection, bleeding, organ or nerve damage, allergic reactions, paralysis, and death. In addition, the patient was informed of those risks and complications associated to Spine-related procedures, such as failure to decrease pain; infection (i.e.: Meningitis, epidural or intraspinal abscess); bleeding (i.e.: epidural hematoma, subarachnoid hemorrhage, or any other type of intraspinal or peri-dural bleeding); organ or nerve damage (i.e.: Any type of peripheral nerve, nerve root, or spinal cord injury) with subsequent damage to sensory, motor,  and/or autonomic systems, resulting in permanent pain, numbness, and/or weakness of one or several areas of the body; allergic reactions; (i.e.: anaphylactic reaction); and/or death. Furthermore, the patient was informed of those risks and complications associated with the medications. These include, but are not limited to: allergic reactions (i.e.: anaphylactic or anaphylactoid reaction(s)); adrenal axis suppression; blood sugar elevation that in diabetics may result in ketoacidosis or comma; water retention that in patients with history of congestive heart failure may result in shortness of breath, pulmonary edema, and decompensation with resultant heart failure; weight gain; swelling or edema; medication-induced neural toxicity; particulate matter embolism and blood vessel occlusion with resultant organ, and/or nervous system infarction; and/or aseptic necrosis of one or more joints. Finally, the patient was informed that Medicine is not an exact science; therefore, there is also the possibility of unforeseen or unpredictable risks and/or possible complications that may result in a catastrophic outcome. The patient indicated having understood very clearly. We have given the patient no guarantees and we have made no promises. Enough time was given to the patient to ask questions, all of which were answered to the patient's satisfaction. Ms. Miah has indicated that she wanted to continue with the procedure. Attestation: I, the ordering provider, attest that I have discussed with the patient the benefits, risks, side-effects, alternatives, likelihood of achieving goals, and potential problems during recovery for the procedure that I have provided informed consent. Date  Time: 08/25/2021  8:57 AM  Pre-Procedure Preparation:  Monitoring: As per clinic protocol. Respiration, ETCO2, SpO2, BP, heart rate and rhythm monitor placed and checked for adequate function Safety Precautions: Patient was assessed for  positional comfort and pressure points before starting the procedure. Time-out: I initiated and conducted the "Time-out" before starting the procedure, as per protocol. The patient was asked to participate by confirming the accuracy of the "Time Out" information. Verification of the correct person, site, and procedure were performed and confirmed by me, the nursing staff, and the patient. "Time-out" conducted as per Joint Commission's Universal Protocol (UP.01.01.01). Time: 5456  Description of Procedure:          Laterality: Bilateral. The procedure was performed in identical fashion on both sides. Levels:  L2, L3, L4, L5, &  S1 Medial Branch Level(s) Area Prepped: Posterior Lumbosacral Region DuraPrep (Iodine Povacrylex [0.7% available iodine] and Isopropyl Alcohol, 74% w/w) Safety Precautions: Aspiration looking for blood return was conducted prior to all injections. At no point did we inject any substances, as a needle was being advanced. Before injecting, the patient was told to immediately notify me if she was experiencing any new onset of "ringing in the ears, or metallic taste in the mouth". No attempts were made at seeking any paresthesias. Safe injection practices and needle disposal techniques used. Medications properly checked for expiration dates. SDV (single dose vial) medications used. After the completion of the procedure, all disposable equipment used was discarded in the proper designated medical waste containers. Local Anesthesia: Protocol guidelines were followed. The patient was positioned over the fluoroscopy table. The area was prepped in the usual manner. The time-out was completed. The target area was identified using fluoroscopy. A 12-in long, straight, sterile hemostat was used with fluoroscopic guidance to locate the targets for each level blocked. Once located, the skin was marked with an approved surgical skin marker. Once all sites were marked, the skin (epidermis, dermis, and  hypodermis), as well as deeper tissues (fat, connective tissue and muscle) were infiltrated with a small amount of a short-acting local anesthetic, loaded on a 10cc syringe with a 25G, 1.5-in  Needle. An appropriate amount of time was allowed for local anesthetics to take effect before proceeding to the next step. Local Anesthetic: Lidocaine 2.0% The unused portion of the local anesthetic was discarded in the proper designated containers. Technical explanation of process:  L2 Medial Branch Nerve Block (MBB): The target area for the L2 medial branch is at the junction of the postero-lateral aspect of the superior articular process and the superior, posterior, and medial edge of the transverse process of L3. Under fluoroscopic guidance, a Quincke needle was inserted until contact was made with os over the superior postero-lateral aspect of the pedicular shadow (target area). After negative aspiration for blood, 0.5 mL of the nerve block solution was injected without difficulty or complication. The needle was removed intact. L3 Medial Branch Nerve Block (MBB): The target area for the L3 medial branch is at the junction of the postero-lateral aspect of the superior articular process and the superior, posterior, and medial edge of the transverse process of L4. Under fluoroscopic guidance, a Quincke needle was inserted until contact was made with os over the superior postero-lateral aspect of the pedicular shadow (target area). After negative aspiration for blood, 0.5 mL of the nerve block solution was injected without difficulty or complication. The needle was removed intact. L4 Medial Branch Nerve Block (MBB): The target area for the L4 medial branch is at the junction of the postero-lateral aspect of the superior articular process and the superior, posterior, and medial edge of the transverse process of L5. Under fluoroscopic guidance, a Quincke needle was inserted until contact was made with os over the superior  postero-lateral aspect of the pedicular shadow (target area). After negative aspiration for blood, 0.5 mL of the nerve block solution was injected without difficulty or complication. The needle was removed intact. L5 Medial Branch Nerve Block (MBB): The target area for the L5 medial branch is at the junction of the postero-lateral aspect of the superior articular process and the superior, posterior, and medial edge of the sacral ala. Under fluoroscopic guidance, a Quincke needle was inserted until contact was made with os over the superior postero-lateral aspect of the pedicular shadow (target area).  After negative aspiration for blood, 0.5 mL of the nerve block solution was injected without difficulty or complication. The needle was removed intact. S1 Medial Branch Nerve Block (MBB): The target area for the S1 medial branch is at the posterior and inferior 6 o'clock position of the L5-S1 facet joint. Under fluoroscopic guidance, the Quincke needle inserted for the L5 MBB was redirected until contact was made with os over the inferior and postero aspect of the sacrum, at the 6 o' clock position under the L5-S1 facet joint (Target area). After negative aspiration for blood, 0.5 mL of the nerve block solution was injected without difficulty or complication. The needle was removed intact.  Nerve block solution: 0.2% PF-Ropivacaine + Triamcinolone (40 mg/mL) diluted to a final concentration of 4 mg of Triamcinolone/mL of Ropivacaine The unused portion of the solution was discarded in the proper designated containers. Procedural Needles: 22-gauge, 5-inch, Quincke needles used for all levels.  Once the entire procedure was completed, the treated area was cleaned, making sure to leave some of the prepping solution back to take advantage of its long term bactericidal properties.      Illustration of the posterior view of the lumbar spine and the posterior neural structures. Laminae of L2 through S1 are labeled.  DPRL5, dorsal primary ramus of L5; DPRS1, dorsal primary ramus of S1; DPR3, dorsal primary ramus of L3; FJ, facet (zygapophyseal) joint L3-L4; I, inferior articular process of L4; LB1, lateral branch of dorsal primary ramus of L1; IAB, inferior articular branches from L3 medial branch (supplies L4-L5 facet joint); IBP, intermediate branch plexus; MB3, medial branch of dorsal primary ramus of L3; NR3, third lumbar nerve root; S, superior articular process of L5; SAB, superior articular branches from L4 (supplies L4-5 facet joint also); TP3, transverse process of L3.  Vitals:   08/25/21 0942 08/25/21 0947 08/25/21 0948 08/25/21 0959  BP: (!) 106/59 90/69 (!) (P) 102/44 131/71  Pulse:      Resp: (!) 22 (!) 24 (P) 14 16  Temp:      SpO2: 97%  (P) 97% 100%  Weight:      Height:         Start Time: 0938 hrs. End Time: 0946 hrs.  Imaging Guidance (Spinal):          Type of Imaging Technique: Fluoroscopy Guidance (Spinal) Indication(s): Assistance in needle guidance and placement for procedures requiring needle placement in or near specific anatomical locations not easily accessible without such assistance. Exposure Time: Please see nurses notes. Contrast: None used. Fluoroscopic Guidance: I was personally present during the use of fluoroscopy. "Tunnel Vision Technique" used to obtain the best possible view of the target area. Parallax error corrected before commencing the procedure. "Direction-depth-direction" technique used to introduce the needle under continuous pulsed fluoroscopy. Once target was reached, antero-posterior, oblique, and lateral fluoroscopic projection used confirm needle placement in all planes. Images permanently stored in EMR. Interpretation: No contrast injected. I personally interpreted the imaging intraoperatively. Adequate needle placement confirmed in multiple planes. Permanent images saved into the patient's record.  Antibiotic Prophylaxis:   Anti-infectives (From  admission, onward)    None      Indication(s): None identified  Post-operative Assessment:  Post-procedure Vital Signs:  Pulse/HCG Rate: 9387 Temp: (!) 97.2 F (36.2 C) Resp: 16 BP: 131/71 SpO2: 100 %  EBL: None  Complications: No immediate post-treatment complications observed by team, or reported by patient.  Note: The patient tolerated the entire procedure well. A repeat set of vitals were  taken after the procedure and the patient was kept under observation following institutional policy, for this type of procedure. Post-procedural neurological assessment was performed, showing return to baseline, prior to discharge. The patient was provided with post-procedure discharge instructions, including a section on how to identify potential problems. Should any problems arise concerning this procedure, the patient was given instructions to immediately contact us, at any time, without hesitation. In any case, we plan to contact the patient by telephone for a follow-up status report regarding this interventional procedure.  Comments:  No additional relevant information.  Plan of Care  Orders:  Orders Placed This Encounter  Procedures   LUMBAR FACET(MEDIAL BRANCH NERVE BLOCK) MBNB    Scheduling Instructions:     Procedure: Lumbar facet block (AKA.: Lumbosacral medial branch nerve block)     Side: Bilateral     Level: L3-4, L4-5, & L5-S1 Facets (L2, L3, L4, L5, & S1 Medial Branch Nerves)     Sedation: Patient's choice.     Timeframe: Today    Order Specific Question:   Where will this procedure be performed?    Answer:   [facility]   DG PA[facility] C-ARM 1-60 MIN NO REPORT    Intraoperative interpretation by procedural physician at [facility].    Standing Status:   Standing    Number of Occurrences:   1    Order Specific Question:   Reason for exam:    Answer:   Assistance in needle guidance and placement for procedures requiring needle placement in or near  specific anatomical locations not easily accessible without such assistance.   Informed Consent Details: Physician/Practitioner Attestation; Transcribe to consent form and obtain patient signature    Nursing Order: Transcribe to consent form and obtain patient signature. Note: Always confirm laterality of pain with Ms. Brigitte Pulse, before procedure.    Order Specific Question:   Physician/Practitioner attestation of informed consent for procedure/surgical case    Answer:   I, the physician/practitioner, attest that I have discussed with the patient the benefits, risks, side effects, alternatives, likelihood of achieving goals and potential problems during recovery for the procedure that I have provided informed consent.    Order Specific Question:   Procedure    Answer:   Lumbar Facet Block  under fluoroscopic guidance    Order Specific Question:   Physician/Practitioner performing the procedure    Answer:   Nobuko Gsell A. [name] MD    Order Specific Question:   Indication/Reason    Answer:   Low Back Pain, with our without leg pain, due to Facet Joint Arthralgia (Joint Pain) Spondylosis (Arthritis of the Spine), without myelopathy or radiculopathy (Nerve Damage).   Provide equipment / supplies at bedside    "Block Tray" (Disposable  single use) Needle type: SpinalSpinal Amount/quantity: 4 Size: Medium (5-inch) Gauge: 22G    Standing Status:   Standing    Number of Occurrences:   1    Order Specific Question:   Specify    Answer:   Block Tray   Miscellanous precautions    NOTE: Although It is true that patients can have allergies to shellfish and that shellfish contain iodine, most shellfish  allergies are due to two protein allergens present in the shellfish: tropomyosins and parvalbumin. Not all patients with shellfish allergies are allergic to iodine. However, as a precaution, avoid using iodine containing products.    Standing Status:   Standing    Number of Occurrences:   1    Chronic  Opioid Analgesic:  Oxycodone IR 5 mg 1 tab PO 5X/day (#150/mo) (25 mg/day) MME/day: 37.5 mg/day.   Medications ordered for procedure: Meds ordered this encounter  Medications   lidocaine (XYLOCAINE) 2 % (with pres) injection 400 mg   lactated ringers infusion 1,000 mL   midazolam (VERSED) 5 MG/5ML injection 0.5-2 mg    Make sure Flumazenil is available in the pyxis when using this medication. If oversedation occurs, administer 0.2 mg IV over 15 sec. If after 45 sec no response, administer 0.2 mg again over 1 min; may repeat at 1 min intervals; not to exceed 4 doses (1 mg)   ropivacaine (PF) 2 mg/mL (0.2%) (NAROPIN) injection 18 mL   triamcinolone acetonide (KENALOG-40) injection 80 mg    Medications administered: We administered lidocaine, lactated ringers, midazolam, ropivacaine (PF) 2 mg/mL (0.2%), and triamcinolone acetonide.  See the medical record for exact dosing, route, and time of administration.  Follow-up plan:   Return in about 2 weeks (around 09/08/2021) for Proc-day (T,Th), (VV), (PPE).       Interventional Therapies  Risk  Complexity Considerations:   Estimated body mass index is 60.59 kg/m as calculated from the following:   Height as of 06/18/21: 5\' 4"  (1.626 m).   Weight as of 06/18/21: 353 lb (160.1 kg). NOTE: NO RFA until BMI less or equal to 35.  (Gastric bypass done on 06/03/2017) Iodine allergy  Contrast dye allergy  Shellfish allergy    Planned  Pending:   Palliative/therapeutic bilateral lumbar facet MBB #7    Under consideration:   Diagnostic bilateral genicular NB    Completed:   Therapeutic bilateral IA Zilretta knee injection x2 (08/04/2021) (100/100/75/75)  Therapeutic bilateral IA Hyalgan knee injection x16 (06/24/2020)  Therapeutic bilateral THR (total hip replacements) (Dr. Rudene Christians) (Right: 11/27/2019) (Left: 07/29/2015)  Palliative/therapeutic bilateral lumbar facet MBB x6 (06/18/2021) (100/100/75/75)  Therapeutic right IA hip injection x3  (07/07/2017)  Therapeutic left IA shoulder (glenohumeral joint) injection x1 (05/31/2017)    Therapeutic  Palliative (PRN) options:   Palliative bilateral knee injections  Palliative bilateral lumbar facet MBBs      Recent Visits Date Type Provider Dept  08/18/21 Office Visit Milinda Pointer, MD [facility]  08/04/21 Procedure visit Milinda Pointer, MD [facility]  07/02/21 Office Visit Milinda Pointer, MD [facility]  06/18/21 Procedure visit Milinda Pointer, MD [facility]  06/03/21 Office Visit Milinda Pointer, MD [facility]  Showing recent visits within past 90 days and meeting all other requirements Today's Visits Date Type Provider Dept  08/25/21 Procedure visit Milinda Pointer, MD [facility]  Showing today's visits and meeting all other requirements Future Appointments Date Type Provider Dept  09/08/21 Appointment Milinda Pointer, [facility]  10/21/21 Appointment Milinda Pointer, MD [facility]  Showing future appointments within next 90 days and meeting all other requirements Disposition: Discharge home  Discharge (Date  Time): 08/25/2021;   hrs.   Primary Care Physician: Idelle Crouch, MD Location: [facility] Note by: Gaspar Cola, MD Date: 08/25/2021; Time: 10:05 AM  Disclaimer:  Medicine is not an Chief Strategy Officer. The only guarantee in medicine is that nothing is guaranteed. It is important to note that the decision to proceed with this intervention was based on the information collected from the patient. The Data and conclusions were drawn from the patient's questionnaire, the interview, and the physical examination. Because the information was provided in large part by the patient, it  cannot be guaranteed that it has not been purposely or unconsciously manipulated. Every effort has been made to obtain as much relevant data  as possible for this evaluation. It is important to note that the conclusions that lead to this procedure are derived in large part from the available data. Always take into account that the treatment will also be dependent on availability of resources and existing treatment guidelines, considered by other Pain Management Practitioners as being common knowledge and practice, at the time of the intervention. For Medico-Legal purposes, it is also important to point out that variation in procedural techniques and pharmacological choices are the acceptable norm. The indications, contraindications, technique, and results of the above procedure should only be interpreted and judged by a Board-Certified Interventional Pain Specialist with extensive familiarity and expertise in the same exact procedure and technique.

## 2021-08-26 ENCOUNTER — Telehealth: Payer: Self-pay

## 2021-08-26 DIAGNOSIS — E785 Hyperlipidemia, unspecified: Secondary | ICD-10-CM | POA: Diagnosis not present

## 2021-08-26 DIAGNOSIS — E1169 Type 2 diabetes mellitus with other specified complication: Secondary | ICD-10-CM | POA: Diagnosis not present

## 2021-08-26 DIAGNOSIS — J310 Chronic rhinitis: Secondary | ICD-10-CM | POA: Diagnosis not present

## 2021-08-26 DIAGNOSIS — E119 Type 2 diabetes mellitus without complications: Secondary | ICD-10-CM | POA: Diagnosis not present

## 2021-08-26 DIAGNOSIS — E1159 Type 2 diabetes mellitus with other circulatory complications: Secondary | ICD-10-CM | POA: Diagnosis not present

## 2021-08-26 DIAGNOSIS — I152 Hypertension secondary to endocrine disorders: Secondary | ICD-10-CM | POA: Diagnosis not present

## 2021-08-26 DIAGNOSIS — J4530 Mild persistent asthma, uncomplicated: Secondary | ICD-10-CM | POA: Diagnosis not present

## 2021-08-26 DIAGNOSIS — R0609 Other forms of dyspnea: Secondary | ICD-10-CM | POA: Diagnosis not present

## 2021-08-26 NOTE — Telephone Encounter (Signed)
Post procedure phone call.  Patient states she is doing well.  

## 2021-08-31 DIAGNOSIS — G8929 Other chronic pain: Secondary | ICD-10-CM | POA: Diagnosis not present

## 2021-08-31 DIAGNOSIS — R519 Headache, unspecified: Secondary | ICD-10-CM | POA: Diagnosis not present

## 2021-08-31 DIAGNOSIS — M797 Fibromyalgia: Secondary | ICD-10-CM | POA: Diagnosis not present

## 2021-08-31 DIAGNOSIS — Z96641 Presence of right artificial hip joint: Secondary | ICD-10-CM | POA: Diagnosis not present

## 2021-08-31 DIAGNOSIS — R0602 Shortness of breath: Secondary | ICD-10-CM | POA: Diagnosis not present

## 2021-08-31 DIAGNOSIS — Z96642 Presence of left artificial hip joint: Secondary | ICD-10-CM | POA: Diagnosis not present

## 2021-08-31 DIAGNOSIS — R002 Palpitations: Secondary | ICD-10-CM | POA: Diagnosis not present

## 2021-08-31 DIAGNOSIS — G43119 Migraine with aura, intractable, without status migrainosus: Secondary | ICD-10-CM | POA: Diagnosis not present

## 2021-08-31 DIAGNOSIS — M1610 Unilateral primary osteoarthritis, unspecified hip: Secondary | ICD-10-CM | POA: Diagnosis not present

## 2021-08-31 DIAGNOSIS — Z96643 Presence of artificial hip joint, bilateral: Secondary | ICD-10-CM | POA: Diagnosis not present

## 2021-09-07 NOTE — Progress Notes (Signed)
Patient: Karen Lewis  Service Category: E/M  Provider: Gaspar Cola, MD  DOB: [birth year]  DOS: 09/08/2021  Location: Office  MRN: [id number]  Setting: Ambulatory outpatient  Referring Provider: Idelle Crouch, MD  Type: Established Patient  Specialty: Interventional Pain Management  PCP: Idelle Crouch, MD  Location: Remote location  Delivery: TeleHealth     Virtual Encounter - Pain Management PROVIDER NOTE: Information contained herein reflects review and annotations entered in association with encounter. Interpretation of such information and data should be left to medically-trained personnel. Information provided to patient can be located elsewhere in the medical record under "Patient Instructions". Document created using STT-dictation technology, any transcriptional errors that may result from process are unintentional.    Contact & Pharmacy Preferred: [phone] Home: [phone] (home) Mobile: [phone] (mobile) E-mail: [email or web address]  [place] [place] [address] [place] [address] Phone: [phone] Fax: [phone]  [id number] [address] [place] [id number]: [phone]: [phone]  Pre-screening  Ms. SLanteroffered "in-person" vs "virtual" encounter. She indicated preferring virtual for this encounter.   Reason COVID-19*   Social distancing based on CDC and AMA recommendations.   I contacted LAlthea Charonon 09/08/2021 via telephone.      I clearly identified myself as FGaspar Cola MD. I verified that I was speaking with the correct person using two identifiers (Name: LBRIGHID KOCH and date of birth: 1Jul 12, 1972.  Consent I sought verbal advanced consent from LAlthea Charonfor virtual visit interactions. I informed Ms. SScarolaof possible security and privacy concerns, risks, and limitations associated with providing "not-in-person"  medical evaluation and management services. I also informed Ms. SReagorof the availability of "in-person" appointments. Finally, I informed her that there would be a charge for the virtual visit and that she could be  personally, fully or partially, financially responsible for it. Ms. SBurdexpressed understanding and agreed to proceed.   Historic Elements   Ms. LZENOBIA KUENNENis a 50y.o. year old, female patient evaluated today after our last contact on 08/25/2021. Ms. SKeir has a past medical history of Anemia, Anginal pain ([place], Anxiety, Arthralgia of hip (07/29/2015), Arthritis, Arthritis, degenerative (07/29/2015), Asthma, Cephalalgia (07/25/2014), Dependence on unknown drug ([place], Depression, Diabetes mellitus without complication ([place], Difficult intubation, Dysrhythmia, Eczema, Fibromyalgia, Gastritis, GERD (gastroesophageal reflux disease), Gonalgia (07/29/2015), Gout, H/O cardiovascular disorder (03/10/2015), H/O surgical procedure (12/05/2012), H/O thyroid disease (03/10/2015), Headache, Herpes, History of artificial joint (07/29/2015), History of hiatal hernia, Hypertension, Hypomagnesemia, Hypothyroidism, IDA (iron deficiency anemia) (05/28/2019), LBP (low back pain) (07/29/2015), Neuromuscular disorder ([place], Obesity, PCOS (polycystic ovarian syndrome), Primary osteoarthritis of both knees (07/29/2015), Sleep apnea, Thyroid nodule (bilateral), and Umbilical hernia. She also  has a past surgical history that includes Laparoscopic partial gastrectomy; Shoulder arthroscopy (Right); Carpal tunnel release (Bilateral); Diagnostic laparoscopy; Cholecystectomy; Trigger finger release (Right); Thyroidectomy (N/A, 11/12/2015); left trigger finger; Roux-en-Y Gastric Bypass (06/03/2017); Hiatal hernia repair; peniculectomy (N/A, 07/05/2018); Total hip arthroplasty (Right, 11/27/2019); Joint replacement (Bilateral, hip); Appendectomy; Trigger finger release (Right, 04/24/2020); Colonoscopy with propofol (N/A, 02/11/2021); and  Esophagogastroduodenoscopy (egd) with propofol (N/A, 02/11/2021). Ms. SGwinhas a current medication list which includes the following prescription(s): accu-chek aviva plus, acetaminophen, acidophilus, albuterol, amitriptyline, vitamin c, benzonatate, biotin, butalbital-acetaminophen-caffeine, calcium carbonate, clonazepam, b-12, diclofenac sodium, diphenhydramine, dupixent, emgality, epinephrine, ergocalciferol, famotidine, fluticasone furoate-vilanterol, furosemide, accu-chek aviva plus, guaifenesin-codeine, levocetirizine, magnesium oxide, meclizine, metformin,  metoprolol tartrate, montelukast, tab-a-vite, naloxone, naphazoline-pheniramine, nitrofurantoin (macrocrystal-monohydrate), omeprazole, oxycodone, [START ON 09/23/2021] oxycodone, ozempic (0.25 or 0.5 mg/dose), quetiapine, rizatriptan, rizatriptan, rosuvastatin, sertraline, spironolactone, tizanidine, topiramate, zolpidem, zonisamide, levothyroxine, lisinopril, oxycodone, oxycodone, and pregabalin. She  reports that she has never smoked. She has never used smokeless tobacco. She reports that she does not drink alcohol and does not use drugs. Ms. Behlke is allergic to bactrim [sulfamethoxazole-trimethoprim], omalizumab, ciprofloxacin, shellfish allergy, and nsaids.   HPI  Today, she is being contacted for a post-procedure assessment.  The patient refers having initially attained 100% relief of her pain for the duration of the local anesthetic.  After the local anesthetic wore off, then the relief went down to about a 60% that is currently ongoing.  She refers that she still having some pain in the area of the right thigh and some numbness and weakness in the left leg that goes to the bottom of the foot and what appears to be an S1 dermatomal distribution.  In addition to that, she still have some low back pain that seems to be worse on the right side compared to the left.  That combination of the persistent low back pain and right thigh pain would suggest a  possible nerve root involvement at the L1/L2 level on the right side while on the left side she may be having involvement of the left S1 nerve root possibly coming from the lower lumbar region.  She has a documented grade 1 anterolisthesis of L4 over L5 but no CTs or MRIs of the lumbar region, possibly secondary to the fact that she has a BMI of around 60.  At this point we will hold off from any further procedures until she finishes with her flu vaccines.  Post-procedure evaluation     Procedure:          Type: Lumbar Facet, Medial Branch Block(s)          Primary Purpose: Diagnostic/Therapeutic Region: Posterolateral Lumbosacral Spine Level: L2, L3, L4, L5, & S1 Medial Branch Level(s). Injecting these levels blocks the L3-4, L4-5, and L5-S1 lumbar facet joints. Laterality: Bilateral Anesthesia: Local (1-2% Lidocaine)  Anxiolysis: IV  Sedation: None  Guidance: Fluoroscopy          Indications: 1. Lumbar facet syndrome (Bilateral) (R>L)   2. Osteoarthritis of lumbar spine without myelopathy or radiculopathy   3. Spondylosis without myelopathy or radiculopathy, lumbosacral region   4. Grade 1 (1.4 cm) Anterolisthesis of L4 over L5   5. DDD (degenerative disc disease), lumbosacral   6. Chronic low back pain (3ry area of Pain) (Bilateral) (R>L) w/o sciatica   7. Class 3 severe obesity due to excess calories with serious comorbidity and body mass index (BMI) of 60.0 to 69.9 in adult ([place])   8. Allergy to iodine    Pain Score: Pre-procedure: 4 /10 Post-procedure: 5 /10      Effectiveness:  Initial hour after procedure: 100 %. Subsequent 4-6 hours post-procedure: 100 %. Analgesia past initial 6 hours: 60 % (ongoing). Ongoing improvement:  Analgesic: The patient indicates currently enjoying an ongoing 60% improvement in her pain. Function: Ms. Campoy reports improvement in function ROM: Ms. Pitney reports improvement in ROM  Pharmacotherapy Assessment   Opioid Analgesic: Oxycodone IR 5  mg 1 tab PO 5X/day (#150/mo) (25 mg/day) MME/day: 37.5 mg/day.   Monitoring: [place] PMP: PDMP reviewed during this encounter.       Pharmacotherapy: No side-effects or adverse reactions reported. Compliance: No problems identified. Effectiveness: Clinically acceptable. Plan:  Refer to "POC". UDS:  Summary  Date Value Ref Range Status  02/19/2020 Note  Final    Comment:    ==================================================================== [employer] Select 13 (MW) ==================================================================== Test                             Result       Flag       Units  Drug Present and Declared for Prescription Verification   7-aminoclonazepam              44           EXPECTED   ng/mg creat    7-aminoclonazepam is an expected metabolite of clonazepam. Source of    clonazepam is a scheduled prescription medication.  Drug Present not Declared for Prescription Verification   Oxycodone                      199          UNEXPECTED ng/mg creat   Oxymorphone                    127          UNEXPECTED ng/mg creat   Noroxycodone                   1026         UNEXPECTED ng/mg creat   Noroxymorphone                 56           UNEXPECTED ng/mg creat    Sources of oxycodone are scheduled prescription medications.    Oxymorphone, noroxycodone, and noroxymorphone are expected    metabolites of oxycodone. Oxymorphone is also available as a    scheduled prescription medication.  Drug Absent but Declared for Prescription Verification   Butalbital                     Not Detected UNEXPECTED ==================================================================== Test                      Result    Flag   Units      Ref Range   Creatinine              165              mg/dL      >=20 ==================================================================== Declared Medications:  The flagging and interpretation on this report are based on the  following declared medications.   Unexpected results may arise from  inaccuracies in the declared medications.   **Note: The testing scope of this panel includes these medications:   Butalbital (Fioricet)  Clonazepam (Klonopin)   **Note: The testing scope of this panel does not include the  following reported medications:   Acetaminophen (Tylenol)  Acetaminophen (Fioricet)  Albuterol (Ventolin HFA)  Amitriptyline (Elavil)  Biotin  Caffeine (Fioricet)  Calcium (Tums)  Diphenhydramine (Benadryl)  Epinephrine (EpiPen)  Eye Drops  Famotidine (Pepcid)  Levocetirizine (Xyzal)  Levothyroxine (Synthroid)  Lisinopril (Zestril)  Magnesium (Mag-Ox)  Meclizine (Antivert)  Medroxyprogesterone (Depo-Provera)  Metformin (Glucophage)  Metoprolol (Lopressor)  Montelukast (Singulair)  Multivitamin  Naloxone (Narcan)  Pregabalin (Lyrica)  Probiotic  Quetiapine (Seroquel)  Rosuvastatin (Crestor)  Sertraline (Zoloft)  Spironolactone (Aldactone)  Tizanidine (Zanaflex)  Topical  Topical Diclofenac  Triamcinolone (Nasacort)  Vitamin B  Vitamin B12  Vitamin C  Vitamin  D2  Zolpidem (Ambien)  Zonisamide (Zonegran) ==================================================================== For clinical consultation, please call [phone]. ====================================================================      Laboratory Chemistry Profile   Renal Lab Results  Component Value Date   BUN 19 04/28/2021   CREATININE 1.06 (H) 04/28/2021   GFRAA >60 04/16/2020   GFRNONAA >60 04/28/2021    Hepatic Lab Results  Component Value Date   AST 34 04/22/2021   ALT 34 04/22/2021   ALBUMIN 4.1 04/22/2021   ALKPHOS 63 04/22/2021    Electrolytes Lab Results  Component Value Date   NA 134 (L) 04/28/2021   K 4.5 04/28/2021   CL 101 04/28/2021   CALCIUM 8.4 (L) 04/28/2021   MG 2.1 04/28/2021   PHOS 5.5 (H) 05/22/2019    Bone Lab Results  Component Value Date   VD25OH 27.4 (L) 11/24/2015   VD125OH2TOT 41.5 11/24/2015     Inflammation (CRP: Acute Phase) (ESR: Chronic Phase) Lab Results  Component Value Date   CRP 0.7 09/27/2019   ESRSEDRATE 30 (H) 09/27/2019         Note: Above Lab results reviewed.  Imaging  DG PAIN CLINIC C-ARM 1-60 MIN NO REPORT Fluoro was used, but no Radiologist interpretation will be provided.  Please refer to "NOTES" tab for provider progress note.  Assessment  The primary encounter diagnosis was Lumbar facet syndrome (Bilateral) (R>L). Diagnoses of Chronic low back pain (3ry area of Pain) (Bilateral) (R>L) w/o sciatica, Grade 1 (1.4 cm) Anterolisthesis of L4 over L5, Osteoarthritis of lumbar spine without myelopathy or radiculopathy, and Class 3 severe obesity due to excess calories with serious comorbidity and body mass index (BMI) of 60.0 to 69.9 in adult [facility]) were also pertinent to this visit.  Plan of Care  Problem-specific:  No problem-specific Assessment & Plan notes found for this encounter.  Ms. DOVIE KAPUSTA has a current medication list which includes the following long-term medication(s): albuterol, clonazepam, diphenhydramine, furosemide, magnesium oxide, metformin, metoprolol tartrate, montelukast, naloxone, oxycodone, [START ON 09/23/2021] oxycodone, rizatriptan, rizatriptan, spironolactone, zonisamide, levothyroxine, lisinopril, oxycodone, oxycodone, and pregabalin.  Pharmacotherapy (Medications Ordered): No orders of the defined types were placed in this encounter.  Orders:  No orders of the defined types were placed in this encounter.  Follow-up plan:   Return for scheduled encounter.     Interventional Therapies  Risk   Complexity Considerations:   Estimated body mass index is 60.59 kg/m as calculated from the following:   Height as of 06/18/21: '5\' 4"'  (1.626 m).   Weight as of 06/18/21: 353 lb (160.1 kg). NOTE: NO RFA until BMI less or equal to 35.  (Gastric bypass done on 06/03/2017) Iodine allergy  Contrast dye allergy  Shellfish allergy     Planned   Pending:      Under consideration:   Diagnostic bilateral genicular NB    Completed:   Therapeutic bilateral IA Zilretta knee injection x2 (08/04/2021) (100/100/75/75)  Therapeutic bilateral IA Hyalgan knee injection x16 (06/24/2020)  Therapeutic bilateral THR (total hip replacements) (Dr. Rudene Christians) (Right: 11/27/2019) (Left: 07/29/2015)  Palliative/therapeutic bilateral lumbar facet MBB x7 (08/25/2021) (100/100/60/60)  Therapeutic right IA hip injection x3 (07/07/2017)  Therapeutic left IA shoulder (glenohumeral joint) injection x1 (05/31/2017)    Therapeutic   Palliative (PRN) options:   Palliative bilateral knee injections  Palliative bilateral lumbar facet MBBs     Recent Visits Date Type Provider Dept  08/25/21 Procedure visit Milinda Pointer, MD [facility]  08/18/21 Office Visit Milinda Pointer, [facility]  08/04/21  Procedure visit Milinda Pointer, MD [facility]  07/02/21 Office Visit Milinda Pointer, MD [facility]  06/18/21 Procedure visit Milinda Pointer, MD [facility]  Showing recent visits within past 90 days and meeting all other requirements Today's Visits Date Type Provider Dept  09/08/21 Office Visit Milinda Pointer, MD [facility]  Showing today's visits and meeting all other requirements Future Appointments Date Type Provider Dept  10/21/21 Appointment Milinda Pointer, MD [facility]  Showing future appointments within next 90 days and meeting all other requirements  I discussed the assessment and treatment plan with the patient. The patient was provided an opportunity to ask questions and all were answered. The patient agreed with the plan and demonstrated an understanding of the instructions.  Patient advised to call back or seek an in-person evaluation if the symptoms or condition worsens.  Duration of encounter: 18 minutes.  Note by: Gaspar Cola, MD Date: 09/08/2021; Time: 4:36 PM

## 2021-09-08 ENCOUNTER — Other Ambulatory Visit: Payer: Self-pay

## 2021-09-08 ENCOUNTER — Ambulatory Visit: Payer: Medicare HMO | Attending: Pain Medicine | Admitting: Pain Medicine

## 2021-09-08 DIAGNOSIS — M4310 Spondylolisthesis, site unspecified: Secondary | ICD-10-CM

## 2021-09-08 DIAGNOSIS — G8929 Other chronic pain: Secondary | ICD-10-CM | POA: Diagnosis not present

## 2021-09-08 DIAGNOSIS — M545 Low back pain, unspecified: Secondary | ICD-10-CM | POA: Diagnosis not present

## 2021-09-08 DIAGNOSIS — M47816 Spondylosis without myelopathy or radiculopathy, lumbar region: Secondary | ICD-10-CM

## 2021-09-08 DIAGNOSIS — Z6841 Body Mass Index (BMI) 40.0 and over, adult: Secondary | ICD-10-CM

## 2021-09-09 ENCOUNTER — Ambulatory Visit (INDEPENDENT_AMBULATORY_CARE_PROVIDER_SITE_OTHER): Payer: Medicare HMO | Admitting: Nurse Practitioner

## 2021-09-10 ENCOUNTER — Other Ambulatory Visit: Payer: Self-pay

## 2021-09-10 NOTE — Patient Outreach (Signed)
[facility]) Care Management  09/10/2021  SARHA BARTELT [birth year] [phone]   Called patient to inform patient appt cancelled and will be rescheduled in February.  Thank you, [place] Care Management Assistant

## 2021-09-17 ENCOUNTER — Ambulatory Visit: Payer: Self-pay

## 2021-09-21 ENCOUNTER — Encounter (INDEPENDENT_AMBULATORY_CARE_PROVIDER_SITE_OTHER): Payer: Self-pay | Admitting: Nurse Practitioner

## 2021-09-21 ENCOUNTER — Other Ambulatory Visit: Payer: Self-pay

## 2021-09-21 ENCOUNTER — Ambulatory Visit
Admission: RE | Admit: 2021-09-21 | Discharge: 2021-09-21 | Disposition: A | Discharge status: Home / Self Care | Payer: Medicare HMO | Source: Ambulatory Visit | Attending: Obstetrics & Gynecology | Admitting: Obstetrics & Gynecology

## 2021-09-21 ENCOUNTER — Ambulatory Visit (INDEPENDENT_AMBULATORY_CARE_PROVIDER_SITE_OTHER): Payer: Medicare HMO | Admitting: Vascular Surgery

## 2021-09-21 VITALS — BP 128/79 | HR 82 | Resp 16 | Wt 353.8 lb

## 2021-09-21 DIAGNOSIS — I890 Lymphedema, not elsewhere classified: Secondary | ICD-10-CM | POA: Diagnosis not present

## 2021-09-21 DIAGNOSIS — Z780 Asymptomatic menopausal state: Secondary | ICD-10-CM | POA: Insufficient documentation

## 2021-09-21 DIAGNOSIS — K219 Gastro-esophageal reflux disease without esophagitis: Secondary | ICD-10-CM

## 2021-09-21 DIAGNOSIS — E119 Type 2 diabetes mellitus without complications: Secondary | ICD-10-CM | POA: Diagnosis not present

## 2021-09-21 DIAGNOSIS — Z13820 Encounter for screening for osteoporosis: Secondary | ICD-10-CM | POA: Insufficient documentation

## 2021-09-21 DIAGNOSIS — E1159 Type 2 diabetes mellitus with other circulatory complications: Secondary | ICD-10-CM

## 2021-09-21 DIAGNOSIS — I152 Hypertension secondary to endocrine disorders: Secondary | ICD-10-CM

## 2021-09-23 ENCOUNTER — Encounter (INDEPENDENT_AMBULATORY_CARE_PROVIDER_SITE_OTHER): Payer: Self-pay | Admitting: Vascular Surgery

## 2021-09-23 NOTE — Progress Notes (Signed)
MRN : [id number]  Karen Lewis is a 51 y.o. (July 25, 1971) female who presents with chief complaint of check leg swelling.  History of Present Illness:  The patient returns to the office for followup evaluation regarding leg swelling.  The swelling has persisted but with the lymph pump is much, much better controlled. The pain associated with swelling is essentially eliminated. There have not been any interval development of a ulcerations or wounds.  The patient denies problems with the pump, noting it is working well and the leggings are in good condition.  Since the previous visit the patient has been wearing graduated compression stockings and using the lymph pump on a routine basis and  has noted significant improvement in the lymphedema.   Patient stated the lymph pump has been a very positive factor in her care.    Current Meds  Medication Sig   ACCU-CHEK AVIVA PLUS test strip    acetaminophen (TYLENOL) 650 MG CR tablet Take 650-1,300 mg by mouth every 8 (eight) hours as needed for pain.   acidophilus (RISAQUAD) CAPS capsule Take 1 capsule by mouth daily.   albuterol (PROVENTIL HFA;VENTOLIN HFA) 108 (90 BASE) MCG/ACT inhaler Inhale 2 puffs into the lungs every 6 (six) hours as needed for wheezing or shortness of breath.   amitriptyline (ELAVIL) 100 MG tablet Take 50 mg by mouth at bedtime.   Ascorbic Acid (VITAMIN C) 1000 MG tablet Take 1,000 mg by mouth daily.    benzonatate (TESSALON) 100 MG capsule Take 100 mg by mouth.   Biotin 10 MG CAPS Take 10 mg by mouth daily.    butalbital-acetaminophen-caffeine (FIORICET) 50-325-40 MG tablet Take 1 tablet by mouth every 6 (six) hours as needed for migraine.    calcium carbonate (TUMS EX) 750 MG chewable tablet Chew 1 tablet by mouth daily.    clonazePAM (KLONOPIN) 0.5 MG tablet Take 1 tablet (0.5 mg total) by mouth 3 (three) times daily as needed for anxiety.   Cyanocobalamin (B-12) 2500 MCG TABS Take 2,500 mcg by mouth daily.    diclofenac sodium (VOLTAREN) 1 % GEL Apply 2 g topically 4 (four) times daily. (Patient taking differently: Apply 2 g topically 3 (three) times daily as needed (pain).)   diphenhydrAMINE (BENADRYL) 25 MG tablet Take 25 mg by mouth every 8 (eight) hours as needed for itching or allergies.    DUPIXENT 300 MG/2ML SOPN    EPINEPHrine 0.3 mg/0.3 mL IJ SOAJ injection Inject 0.3 mg into the muscle as needed for anaphylaxis.    ergocalciferol (VITAMIN D2) 1.25 MG (50000 UT) capsule Take 50,000 Units by mouth 2 (two) times a week.    famotidine (PEPCID) 20 MG tablet Take 20 mg by mouth at bedtime.   fluticasone furoate-vilanterol (BREO ELLIPTA) 200-25 MCG/INH AEPB Inhale 1 puff into the lungs daily.   furosemide (LASIX) 20 MG tablet Take 20 mg by mouth daily as needed.   glucose blood (ACCU-CHEK AVIVA PLUS) test strip Use 2 (two) times daily   guaiFENesin-codeine (ROBITUSSIN AC) 100-10 MG/5ML syrup Take by mouth as needed.   levocetirizine (XYZAL) 5 MG tablet Take 5 mg by mouth at bedtime.    levothyroxine (SYNTHROID) 125 MCG tablet Take 200 mcg by mouth daily before breakfast.   lisinopril (ZESTRIL) 5 MG tablet Take 5 mg by mouth at bedtime.    magnesium oxide (MAG-OX) 400 MG tablet Take 1,200 mg by mouth 2 (two) times daily. Take 1200 mg by mouth in the morning and 1200 mg at bedtime  meclizine (ANTIVERT) 25 MG tablet Take 25 mg by mouth 3 (three) times daily as needed for dizziness.    metFORMIN (GLUCOPHAGE) 1000 MG tablet Take 1,000 mg by mouth 2 (two) times daily with a meal.    metoprolol tartrate (LOPRESSOR) 50 MG tablet Take 75 mg by mouth 2 (two) times daily.    montelukast (SINGULAIR) 10 MG tablet Take 10 mg by mouth at bedtime.    Multiple Vitamin (TAB-A-VITE) TABS    naloxone (NARCAN) nasal spray 4 mg/0.1 mL Place 1 spray into the nose as needed for up to 365 doses (for opioid-induced respiratory depresssion). In case of emergency (overdose), spray once into each nostril. If no response within  3 minutes, repeat application and call [phone].   naphazoline-pheniramine (NAPHCON-A) 0.025-0.3 % ophthalmic solution Place 1 drop into both eyes 4 (four) times daily as needed for eye irritation.   naproxen sodium (ALEVE) 220 MG tablet Take 220 mg by mouth as needed.   nitrofurantoin, macrocrystal-monohydrate, (MACROBID) 100 MG capsule Take 1 capsule (100 mg total) by mouth daily.   omeprazole (PRILOSEC) 40 MG capsule Take 40 mg by mouth in the morning and at bedtime.   oxyCODONE (OXY IR/ROXICODONE) 5 MG immediate release tablet Take 1 tablet (5 mg total) by mouth 5 (five) times daily. Must last 30 days   oxyCODONE (OXY IR/ROXICODONE) 5 MG immediate release tablet Take 1 tablet (5 mg total) by mouth 5 (five) times daily. Must last 30 days   oxyCODONE (OXY IR/ROXICODONE) 5 MG immediate release tablet Take 1 tablet (5 mg total) by mouth 5 (five) times daily. Must last 30 days   OZEMPIC, 0.25 OR 0.5 MG/DOSE, 2 MG/1.5ML SOPN Inject 1 mg into the skin.   pregabalin (LYRICA) 225 MG capsule Take 1 capsule (225 mg total) by mouth 2 (two) times daily.   QUEtiapine (SEROQUEL) 100 MG tablet Take 100 mg by mouth at bedtime.   rizatriptan (MAXALT) 10 MG tablet Take 1 and may repeat in 2 hours for MIgraines, alternate with the disintrgrating tablet.   rizatriptan (MAXALT-MLT) 10 MG disintegrating tablet Take 10 mg by mouth. Take 1 and may repeat in 2 hours  for Migraines   rosuvastatin (CRESTOR) 40 MG tablet Take 40 mg by mouth daily.    sertraline (ZOLOFT) 100 MG tablet Take 200 mg by mouth daily.    spironolactone (ALDACTONE) 25 MG tablet Take 37.5 mg by mouth 2 (two) times daily.    tiZANidine (ZANAFLEX) 4 MG tablet Take 1 tablet (4 mg total) by mouth 2 (two) times daily as needed for muscle spasms.   zolpidem (AMBIEN) 10 MG tablet Take 10 mg by mouth at bedtime as needed for sleep.   zonisamide (ZONEGRAN) 50 MG capsule Take 150 mg by mouth at bedtime.    Past Medical History:  Diagnosis Date   Anemia     Anginal pain (HCC)    Anxiety    Arthralgia of hip 07/29/2015   Arthritis    Arthritis, degenerative 07/29/2015   Asthma    Cephalalgia 07/25/2014   Dependence on unknown drug ([place])    multiplt controlled drug dependence   Depression    Diabetes mellitus without complication ([place])    Difficult intubation    per pt needs small tube (has had abrasions from tube in past)   Dysrhythmia    PVC's   Eczema    Fibromyalgia    Gastritis    GERD (gastroesophageal reflux disease)    Gonalgia 07/29/2015   Overview:  Overview:  The patient has had bilateral intra-articular Hyalgan injections done on 07/16/2014 and although she seems to do well with this type of therapy, apparently her insurance company does not want to pay for they Hyalgan. On 11/27/2014 the patient underwent a bilateral genicular nerve block with excellent results. On 01/28/2015 she had a right knee genicular radiofrequency ablatio   Gout    H/O cardiovascular disorder 03/10/2015   H/O surgical procedure 12/05/2012   Overview:  LSG ([facility] - April 2013)     H/O thyroid disease 03/10/2015   Headache    Herpes    History of artificial joint 07/29/2015   History of hiatal hernia    Hypertension    Hypomagnesemia    Hypothyroidism    IDA (iron deficiency anemia) 05/28/2019   LBP (low back pain) 07/29/2015   Neuromuscular disorder (HCC)    Obesity    PCOS (polycystic ovarian syndrome)    Primary osteoarthritis of both knees 07/29/2015   Sleep apnea    not using a Cpap machine at this time - most recent test mild apnea does not qualify for test (not OSA)   Thyroid nodule bilateral   Umbilical hernia     Past Surgical History:  Procedure Laterality Date   APPENDECTOMY     CARPAL TUNNEL RELEASE Bilateral    CHOLECYSTECTOMY     COLONOSCOPY WITH PROPOFOL N/A 02/11/2021   Procedure: COLONOSCOPY WITH PROPOFOL;  Surgeon: Toledo, Benay Pike, MD;  Location: [facility];  Service: Gastroenterology;  Laterality: N/A;   DIAGNOSTIC  LAPAROSCOPY     ESOPHAGOGASTRODUODENOSCOPY (EGD) WITH PROPOFOL N/A 02/11/2021   Procedure: ESOPHAGOGASTRODUODENOSCOPY (EGD) WITH PROPOFOL;  Surgeon: Toledo, Benay Pike, MD;  Location: [facility];  Service: Gastroenterology;  Laterality: N/A;   HIATAL HERNIA REPAIR     JOINT REPLACEMENT Bilateral hip   LAPAROSCOPIC PARTIAL GASTRECTOMY     left trigger finger     peniculectomy N/A 07/05/2018   ROUX-EN-Y GASTRIC BYPASS  06/03/2017   SHOULDER ARTHROSCOPY Right    THYROIDECTOMY N/A 11/12/2015   Procedure: THYROIDECTOMY;  Surgeon: Clyde Canterbury, MD;  Location: [facility];  Service: ENT;  Laterality: N/A;   TOTAL HIP ARTHROPLASTY Right 11/27/2019   Procedure: TOTAL HIP ARTHROPLASTY ANTERIOR APPROACH;  Surgeon: Hessie Knows, MD;  Location: [facility];  Service: Orthopedics;  Laterality: Right;   TRIGGER FINGER RELEASE Right    TRIGGER FINGER RELEASE Right 04/24/2020   Procedure: Right ring and middle trigger release;  Surgeon: Hessie Knows, MD;  Location: [facility];  Service: Orthopedics;  Laterality: Right;    Social History Social History   Tobacco Use   Smoking status: Never   Smokeless tobacco: Never  Vaping Use   Vaping Use: Never used  Substance Use Topics   Alcohol use: No    Alcohol/week: 0.0 standard drinks   Drug use: No    Family History Family History  Problem Relation Age of Onset   Anxiety disorder Mother    Depression Mother    Alcohol abuse Mother    Diabetes Mother    Hypertension Mother    Kidney cancer Mother    Sleep apnea Mother    Alcohol abuse Father    Anxiety disorder Father    Depression Father    Post-traumatic stress disorder Father    Kidney failure Father    COPD Father    Diabetes Father    Hypertension Father    Sleep apnea Father    Depression Brother    Diabetes Brother  Hypertension Brother    Sleep apnea Brother    Breast cancer Paternal Aunt 69   Breast cancer Paternal Aunt 69   Brain cancer Paternal Uncle 94   Bladder Cancer Neg Hx     Prostate cancer Neg Hx     Allergies  Allergen Reactions   Bactrim [Sulfamethoxazole-Trimethoprim] Hives   Omalizumab Itching and Hives   Ciprofloxacin Other (See Comments)    myalgia    Shellfish Allergy Other (See Comments)    + positive allergy test   Nsaids Nausea Only and Other (See Comments)    Stomach upset     REVIEW OF SYSTEMS (Negative unless checked)  Constitutional: [] Weight loss  [] Fever  [] Chills Cardiac: [] Chest pain   [] Chest pressure   [] Palpitations   [] Shortness of breath when laying flat   [] Shortness of breath with exertion. Vascular:  [] Pain in legs with walking   [] Pain in legs at rest  [] History of DVT   [] Phlebitis   [x] Swelling in legs   [] Varicose veins   [] Non-healing ulcers Pulmonary:   [] Uses home oxygen   [] Productive cough   [] Hemoptysis   [] Wheeze  [] COPD   [] Asthma Neurologic:  [] Dizziness   [] Seizures   [] History of stroke   [] History of TIA  [] Aphasia   [] Vissual changes   [x] Weakness or numbness in arm   [x] Weakness or numbness in leg Musculoskeletal:   [] Joint swelling   [] Joint pain   [] Low back pain Hematologic:  [] Easy bruising  [] Easy bleeding   [] Hypercoagulable state   [] Anemic Gastrointestinal:  [] Diarrhea   [] Vomiting  [x] Gastroesophageal reflux/heartburn   [] Difficulty swallowing. Genitourinary:  [] Chronic kidney disease   [] Difficult urination  [] Frequent urination   [] Blood in urine Skin:  [] Rashes   [] Ulcers  Psychological:  [] History of anxiety   []  History of major depression.  Physical Examination  Vitals:   09/21/21 1133  BP: 128/79  Pulse: 82  Resp: 16  Weight: (!) 353 lb 12.8 oz (160.5 kg)   Body mass index is 60.73 kg/m. Gen: WD/WN, NAD Head: [place]/AT, No temporalis wasting.  Ear/Nose/Throat: Hearing grossly intact, nares w/o erythema or drainage, pinna without lesions Eyes: PER, EOMI, sclera nonicteric.  Neck: Supple, no gross masses.  No JVD.  Pulmonary:  Good air movement, no audible wheezing, no use of  accessory muscles.  Cardiac: RRR, precordium not hyperdynamic. Vascular:  scattered varicosities present bilaterally.  Mild venous stasis changes to the legs bilaterally.  3+ soft pitting edema  Vessel Right Left  Radial Palpable Palpable  Gastrointestinal: soft, non-distended. No guarding/no peritoneal signs.  Musculoskeletal: M/S 5/5 throughout.  No deformity.  Neurologic: CN 2-12 intact. Pain and light touch intact in extremities.  Symmetrical.  Speech is fluent. Motor exam as listed above. Psychiatric: Judgment intact, Mood & affect appropriate for pt's clinical situation. Dermatologic: Venous rashes no ulcers noted.  No changes consistent with cellulitis. Lymph : No lichenification or skin changes of chronic lymphedema.  CBC Lab Results  Component Value Date   WBC 13.9 (H) 04/28/2021   HGB 14.0 04/28/2021   HCT 43.0 04/28/2021   MCV 86.7 04/28/2021   PLT 346 04/28/2021    BMET    Component Value Date/Time   NA 134 (L) 04/28/2021 1939   NA 139 06/17/2014 1811   K 4.5 04/28/2021 1939   K 3.6 06/17/2014 1811   CL 101 04/28/2021 1939   CL 111 (H) 06/17/2014 1811   CO2 22 04/28/2021 1939   CO2 22 06/17/2014 1811  GLUCOSE 108 (H) 04/28/2021 1939   GLUCOSE 99 06/17/2014 1811   BUN 19 04/28/2021 1939   BUN 7 06/17/2014 1811   CREATININE 1.06 (H) 04/28/2021 1939   CREATININE 0.98 06/17/2014 1811   CALCIUM 8.4 (L) 04/28/2021 1939   CALCIUM 8.5 06/17/2014 1811   GFRNONAA >60 04/28/2021 1939   GFRNONAA >60 06/17/2014 1811   GFRNONAA >60 12/13/2013 1628   GFRAA >60 04/16/2020 1340   GFRAA >60 06/17/2014 1811   GFRAA >60 12/13/2013 1628   CrCl cannot be calculated (Patient's most recent lab result is older than the maximum 21 days allowed.).  COAG Lab Results  Component Value Date   INR 0.95 05/07/2015   INR 1.0 03/12/2013    Radiology DG Bone Density  Result Date: 09/21/2021 EXAM: DUAL X-RAY ABSORPTIOMETRY (DXA) FOR BONE MINERAL DENSITY IMPRESSION: Your patient  Natara Monfort completed a BMD test on 09/21/2021 using the [place] (software version: 14.10) manufactured by [employer]. The following summarizes the results of our evaluation. Technologist: SCE PATIENT BIOGRAPHICAL: Name: Zera, Markwardt Patient ID: [id number] Birth Date: 04-13-1971 Height: 63.5 in. Gender: Female Exam Date: 09/21/2021 Weight: 353.8 lbs. Indications: Asthma, Bilateral Hip Replacements, Diabetic, Family Hx of Osteoporosis, Height Loss, History of Fracture (Adult), PCOS, Postmenopausal, Thyroid Removal, Vitamin D Deficiency Fractures: Left foot, Ribs, Right foot, Right shoulder Treatments: breo-Ellipta, Calcium, Levothyroxine, Metformin, Multi-Vitamin, Omeprazole, Ozempic Injection, pepcid, Singulair, Vitamin D DENSITOMETRY RESULTS: Site         Region     Measured Date Measured Age WHO Classification Young Adult T-score BMD         %Change vs. Previous Significant Change (*) AP Spine L1-L2 09/21/2021 51.2 Normal -0.3 1.143 g/cm2 - - Left Forearm Radius 33% 09[age]21/2021 51.2 Normal 1.9 1.041 g/cm2 - - ASSESSMENT: The BMD measured at AP Spine L1-L2 is 1.143 g/cm2 with a T-score of -0.3. This patient is considered NORMAL according to [facility]) criteria. The scan quality is good. L3 and L4 was excluded due to degenerative changes. Hips were not utilized due to prior BILATERAL hip arthroplasty. World Pharmacologist [facility]) criteria for post-menopausal, Caucasian Women: Normal:                   T-score at or above -1 SD Osteopenia/low bone mass: T-score between -1 and -2.5 SD Osteoporosis:             T-score at or below -2.5 SD RECOMMENDATIONS: 1. All patients should optimize calcium and vitamin D intake. 2. Consider FDA-approved medical therapies in postmenopausal women and men aged 63 years and older, based on the following: a. A hip or vertebral(clinical or morphometric) fracture b. T-score < -2.5 at the femoral neck or spine after appropriate evaluation to  exclude secondary causes c. Low bone mass (T-score between -1.0 and -2.5 at the femoral neck or spine) and a 10-year probability of a hip fracture > 3% or a 10-year probability of a major osteoporosis-related fracture > 20% based on the [place]-adapted WHO algorithm 3. Clinician judgment and/or patient preferences may indicate treatment for people with 10-year fracture probabilities above or below these levels FOLLOW-UP: People with diagnosed cases of osteoporosis or at high risk for fracture should have regular bone mineral density tests. For patients eligible for Medicare, routine testing is allowed once every 2 years. The testing frequency can be increased to one year for patients who have rapidly progressing disease, those who are receiving or discontinuing medical therapy to restore  bone mass, or have additional risk factors. I have reviewed this report, and agree with the above findings. Mark A. Thornton Papas, M.D. [facility] Electronically Signed   By: Lavonia Dana M.D.   On: 09/21/2021 15:21   DG PAIN CLINIC C-ARM 1-60 MIN NO REPORT  Result Date: 08/25/2021 Fluoro was used, but no Radiologist interpretation will be provided. Please refer to "NOTES" tab for provider progress note.    Assessment/Plan 1. Lymphedema  No surgery or intervention at this point in time.    I have reviewed my discussion with the patient regarding lymphedema and why it  causes symptoms.  Patient will continue wearing graduated compression stockings class 1 (20-30 mmHg) on a daily basis a prescription was given. The patient is reminded to put the stockings on first thing in the morning and removing them in the evening. The patient is instructed specifically not to sleep in the stockings.   In addition, behavioral modification throughout the day will be continued.  This will include frequent elevation (such as in a recliner), use of over the counter pain medications as needed and exercise such as walking.  I have  reviewed systemic causes for chronic edema such as liver, kidney and cardiac etiologies and there does not appear to be any significant changes in these organ systems over the past year.  The patient is under the impression that these organ systems are all stable and unchanged.    The patient will continue aggressive use of the  lymph pump.  This will continue to improve the edema control and prevent sequela such as ulcers and infections.   The patient will follow-up with me on an annual basis.    2. Hypertension associated with diabetes ([place]) Continue antihypertensive medications as already ordered, these medications have been reviewed and there are no changes at this time.   3. Type 2 diabetes mellitus without complication, without long-term current use of insulin (HCC) Continue hypoglycemic medications as already ordered, these medications have been reviewed and there are no changes at this time.  Hgb A1C to be monitored as already arranged by primary service   4. Gastroesophageal reflux disease without esophagitis Continue PPI as already ordered, this medication has been reviewed and there are no changes at this time.  Avoidence of caffeine and alcohol  Moderate elevation of the head of the bed      Hortencia Pilar, MD  09/23/2021 12:53 PM

## 2021-09-24 DIAGNOSIS — F32A Depression, unspecified: Secondary | ICD-10-CM | POA: Diagnosis not present

## 2021-09-24 DIAGNOSIS — M797 Fibromyalgia: Secondary | ICD-10-CM | POA: Diagnosis not present

## 2021-09-24 DIAGNOSIS — Z79899 Other long term (current) drug therapy: Secondary | ICD-10-CM | POA: Diagnosis not present

## 2021-09-24 DIAGNOSIS — Z9884 Bariatric surgery status: Secondary | ICD-10-CM | POA: Diagnosis not present

## 2021-09-24 DIAGNOSIS — G43719 Chronic migraine without aura, intractable, without status migrainosus: Secondary | ICD-10-CM | POA: Diagnosis not present

## 2021-09-24 DIAGNOSIS — F411 Generalized anxiety disorder: Secondary | ICD-10-CM | POA: Diagnosis not present

## 2021-10-02 DIAGNOSIS — I890 Lymphedema, not elsewhere classified: Secondary | ICD-10-CM | POA: Diagnosis not present

## 2021-10-02 DIAGNOSIS — Z08 Encounter for follow-up examination after completed treatment for malignant neoplasm: Secondary | ICD-10-CM | POA: Diagnosis not present

## 2021-10-02 DIAGNOSIS — Z1231 Encounter for screening mammogram for malignant neoplasm of breast: Secondary | ICD-10-CM | POA: Diagnosis not present

## 2021-10-02 DIAGNOSIS — E890 Postprocedural hypothyroidism: Secondary | ICD-10-CM | POA: Diagnosis not present

## 2021-10-02 DIAGNOSIS — Z6841 Body Mass Index (BMI) 40.0 and over, adult: Secondary | ICD-10-CM | POA: Diagnosis not present

## 2021-10-02 DIAGNOSIS — Z0000 Encounter for general adult medical examination without abnormal findings: Secondary | ICD-10-CM | POA: Diagnosis not present

## 2021-10-02 DIAGNOSIS — E119 Type 2 diabetes mellitus without complications: Secondary | ICD-10-CM | POA: Diagnosis not present

## 2021-10-02 DIAGNOSIS — Z79899 Other long term (current) drug therapy: Secondary | ICD-10-CM | POA: Diagnosis not present

## 2021-10-12 DIAGNOSIS — R002 Palpitations: Secondary | ICD-10-CM | POA: Diagnosis not present

## 2021-10-13 DIAGNOSIS — R002 Palpitations: Secondary | ICD-10-CM | POA: Diagnosis not present

## 2021-10-13 DIAGNOSIS — G4733 Obstructive sleep apnea (adult) (pediatric): Secondary | ICD-10-CM | POA: Diagnosis not present

## 2021-10-13 DIAGNOSIS — I152 Hypertension secondary to endocrine disorders: Secondary | ICD-10-CM | POA: Diagnosis not present

## 2021-10-13 DIAGNOSIS — E1159 Type 2 diabetes mellitus with other circulatory complications: Secondary | ICD-10-CM | POA: Diagnosis not present

## 2021-10-13 DIAGNOSIS — R0789 Other chest pain: Secondary | ICD-10-CM | POA: Diagnosis not present

## 2021-10-19 ENCOUNTER — Other Ambulatory Visit: Payer: Self-pay | Admitting: Internal Medicine

## 2021-10-19 ENCOUNTER — Other Ambulatory Visit: Payer: Self-pay | Admitting: *Deleted

## 2021-10-19 DIAGNOSIS — Z1231 Encounter for screening mammogram for malignant neoplasm of breast: Secondary | ICD-10-CM

## 2021-10-19 DIAGNOSIS — D509 Iron deficiency anemia, unspecified: Secondary | ICD-10-CM

## 2021-10-20 DIAGNOSIS — G43719 Chronic migraine without aura, intractable, without status migrainosus: Secondary | ICD-10-CM | POA: Diagnosis not present

## 2021-10-20 DIAGNOSIS — G43119 Migraine with aura, intractable, without status migrainosus: Secondary | ICD-10-CM | POA: Diagnosis not present

## 2021-10-20 NOTE — Progress Notes (Deleted)
Patient forgot the appointment.

## 2021-10-21 ENCOUNTER — Encounter: Payer: Medicare HMO | Admitting: Pain Medicine

## 2021-10-21 DIAGNOSIS — G894 Chronic pain syndrome: Secondary | ICD-10-CM

## 2021-10-21 DIAGNOSIS — M47816 Spondylosis without myelopathy or radiculopathy, lumbar region: Secondary | ICD-10-CM

## 2021-10-21 DIAGNOSIS — Z79891 Long term (current) use of opiate analgesic: Secondary | ICD-10-CM

## 2021-10-21 DIAGNOSIS — Z79899 Other long term (current) drug therapy: Secondary | ICD-10-CM

## 2021-10-21 DIAGNOSIS — G8929 Other chronic pain: Secondary | ICD-10-CM

## 2021-10-26 ENCOUNTER — Ambulatory Visit
Admission: RE | Admit: 2021-10-26 | Discharge: 2021-10-26 | Disposition: A | Discharge status: Home / Self Care | Payer: Medicare HMO | Source: Ambulatory Visit | Attending: Pain Medicine | Admitting: Pain Medicine

## 2021-10-26 ENCOUNTER — Ambulatory Visit: Payer: Medicare HMO | Admitting: Pain Medicine

## 2021-10-26 ENCOUNTER — Inpatient Hospital Stay: Payer: Medicare HMO

## 2021-10-26 ENCOUNTER — Inpatient Hospital Stay: Payer: Medicare HMO | Attending: Oncology

## 2021-10-26 ENCOUNTER — Encounter: Payer: Self-pay | Admitting: Oncology

## 2021-10-26 ENCOUNTER — Other Ambulatory Visit: Payer: Self-pay

## 2021-10-26 ENCOUNTER — Inpatient Hospital Stay: Payer: Medicare HMO | Admitting: Oncology

## 2021-10-26 VITALS — BP 109/66 | HR 93 | Temp 97.2°F | Resp 16 | Ht 63.5 in | Wt 342.0 lb

## 2021-10-26 VITALS — BP 113/74 | HR 83 | Temp 96.6°F | Resp 18 | Wt 360.2 lb

## 2021-10-26 DIAGNOSIS — M5137 Other intervertebral disc degeneration, lumbosacral region: Secondary | ICD-10-CM | POA: Insufficient documentation

## 2021-10-26 DIAGNOSIS — M25561 Pain in right knee: Secondary | ICD-10-CM

## 2021-10-26 DIAGNOSIS — M4310 Spondylolisthesis, site unspecified: Secondary | ICD-10-CM

## 2021-10-26 DIAGNOSIS — M47816 Spondylosis without myelopathy or radiculopathy, lumbar region: Secondary | ICD-10-CM | POA: Insufficient documentation

## 2021-10-26 DIAGNOSIS — M25562 Pain in left knee: Secondary | ICD-10-CM

## 2021-10-26 DIAGNOSIS — M51379 Other intervertebral disc degeneration, lumbosacral region without mention of lumbar back pain or lower extremity pain: Secondary | ICD-10-CM

## 2021-10-26 DIAGNOSIS — Z79891 Long term (current) use of opiate analgesic: Secondary | ICD-10-CM | POA: Insufficient documentation

## 2021-10-26 DIAGNOSIS — Z9884 Bariatric surgery status: Secondary | ICD-10-CM | POA: Insufficient documentation

## 2021-10-26 DIAGNOSIS — M545 Low back pain, unspecified: Secondary | ICD-10-CM | POA: Insufficient documentation

## 2021-10-26 DIAGNOSIS — Z79899 Other long term (current) drug therapy: Secondary | ICD-10-CM

## 2021-10-26 DIAGNOSIS — M5417 Radiculopathy, lumbosacral region: Secondary | ICD-10-CM

## 2021-10-26 DIAGNOSIS — G8929 Other chronic pain: Secondary | ICD-10-CM | POA: Insufficient documentation

## 2021-10-26 DIAGNOSIS — M438X6 Other specified deforming dorsopathies, lumbar region: Secondary | ICD-10-CM | POA: Diagnosis not present

## 2021-10-26 DIAGNOSIS — M4316 Spondylolisthesis, lumbar region: Secondary | ICD-10-CM | POA: Diagnosis not present

## 2021-10-26 DIAGNOSIS — D508 Other iron deficiency anemias: Secondary | ICD-10-CM | POA: Diagnosis not present

## 2021-10-26 DIAGNOSIS — D509 Iron deficiency anemia, unspecified: Secondary | ICD-10-CM

## 2021-10-26 DIAGNOSIS — M153 Secondary multiple arthritis: Secondary | ICD-10-CM | POA: Insufficient documentation

## 2021-10-26 DIAGNOSIS — G894 Chronic pain syndrome: Secondary | ICD-10-CM

## 2021-10-26 DIAGNOSIS — M25551 Pain in right hip: Secondary | ICD-10-CM | POA: Insufficient documentation

## 2021-10-26 DIAGNOSIS — M419 Scoliosis, unspecified: Secondary | ICD-10-CM | POA: Diagnosis not present

## 2021-10-26 LAB — CBC WITH DIFFERENTIAL/PLATELET
Abs Immature Granulocytes: 0.03 10*3/uL (ref 0.00–0.07)
Basophils Absolute: 0.1 10*3/uL (ref 0.0–0.1)
Basophils Relative: 1 %
Eosinophils Absolute: 0.2 10*3/uL (ref 0.0–0.5)
Eosinophils Relative: 2 %
HCT: 40.5 % (ref 36.0–46.0)
Hemoglobin: 13.1 g/dL (ref 12.0–15.0)
Immature Granulocytes: 0 %
Lymphocytes Relative: 21 %
Lymphs Abs: 2.1 10*3/uL (ref 0.7–4.0)
MCH: 28.3 pg (ref 26.0–34.0)
MCHC: 32.3 g/dL (ref 30.0–36.0)
MCV: 87.5 fL (ref 80.0–100.0)
Monocytes Absolute: 0.6 10*3/uL (ref 0.1–1.0)
Monocytes Relative: 6 %
Neutro Abs: 6.9 10*3/uL (ref 1.7–7.7)
Neutrophils Relative %: 70 %
Platelets: 315 10*3/uL (ref 150–400)
RBC: 4.63 MIL/uL (ref 3.87–5.11)
RDW: 14 % (ref 11.5–15.5)
WBC: 9.9 10*3/uL (ref 4.0–10.5)
nRBC: 0 % (ref 0.0–0.2)

## 2021-10-26 LAB — IRON AND TIBC
Iron: 66 ug/dL (ref 28–170)
Saturation Ratios: 14 % (ref 10.4–31.8)
TIBC: 459 ug/dL — ABNORMAL HIGH (ref 250–450)
UIBC: 393 ug/dL

## 2021-10-26 LAB — FERRITIN: Ferritin: 35 ng/mL (ref 11–307)

## 2021-10-26 LAB — VITAMIN B12: Vitamin B-12: 391 pg/mL (ref 180–914)

## 2021-10-26 MED ORDER — OXYCODONE HCL 5 MG PO TABS: 5.0000 mg | ORAL_TABLET | Freq: Every day | ORAL | 0 refills | Status: DC

## 2021-10-26 NOTE — Patient Instructions (Signed)
______________________________________________________________________ ° °Preparing for Procedure with Sedation ° °NOTICE: Due to recent regulatory changes, starting on April 13, 2021, procedures requiring intravenous (IV) sedation will no longer be performed at the [facility].  These types of procedures are required to be performed at [facility] ambulatory surgery facility.  We are very sorry for the inconvenience. ° °Procedure appointments are limited to planned procedures: °No Prescription Refills. °No disability issues will be discussed. °No medication changes will be discussed. ° °Instructions: °Oral Intake: Do not eat or drink anything for at least 8 hours prior to your procedure. (Exception: Blood Pressure Medication. See below.) °Transportation: A driver is required. You may not drive yourself after the procedure. °Blood Pressure Medicine: Do not forget to take your blood pressure medicine with a sip of water the morning of the procedure. If your Diastolic (lower reading) is above 100 mmHg, elective cases will be cancelled/rescheduled. °Blood thinners: These will need to be stopped for procedures. Notify our staff if you are taking any blood thinners. Depending on which one you take, there will be specific instructions on how and when to stop it. °Diabetics on insulin: Notify the staff so that you can be scheduled 1st case in the morning. If your diabetes requires high dose insulin, take only ½ of your normal insulin dose the morning of the procedure and notify the staff that you have done so. °Preventing infections: Shower with an antibacterial soap the morning of your procedure. °Build-up your immune system: Take 1000 mg of Vitamin C with every meal (3 times a day) the day prior to your procedure. °Antibiotics: Inform the staff if you have a condition or reason that requires you to take antibiotics before dental procedures. °Pregnancy: If you are pregnant, call and cancel the procedure. °Sickness: If  you have a cold, fever, or any active infections, call and cancel the procedure. °Arrival: You must be in the facility at least 30 minutes prior to your scheduled procedure. °Children: Do not bring children with you. °Dress appropriately: Bring dark clothing that you would not mind if they get stained. °Valuables: Do not bring any jewelry or valuables. ° °Reasons to call and reschedule or cancel your procedure: (Following these recommendations will minimize the risk of a serious complication.) °Surgeries: Avoid having procedures within 2 weeks of any surgery. (Avoid for 2 weeks before or after any surgery). °Flu Shots: Avoid having procedures within 2 weeks of a flu shots. (Avoid for 2 weeks before or after immunizations). °Barium: Avoid having a procedure within 7-10 days after having had a radiological study involving the use of radiological contrast. (Myelograms, Barium swallow or enema study). °Heart attacks: Avoid any elective procedures or surgeries for the initial 6 months after a "Myocardial Infarction" (Heart Attack). °Blood thinners: It is imperative that you stop these medications before procedures. Let us know if you if you take any blood thinner.  °Infection: Avoid procedures during or within two weeks of an infection (including chest colds or gastrointestinal problems). Symptoms associated with infections include: Localized redness, fever, chills, night sweats or profuse sweating, burning sensation when voiding, cough, congestion, stuffiness, runny nose, sore throat, diarrhea, nausea, vomiting, cold or Flu symptoms, recent or current infections. It is specially important if the infection is over the area that we intend to treat. °Heart and lung problems: Symptoms that may suggest an active cardiopulmonary problem include: cough, chest pain, breathing difficulties or shortness of breath, dizziness, ankle swelling, uncontrolled high or unusually low blood pressure, and/or palpitations. If you are    experiencing any of these symptoms, cancel your procedure and contact your primary care physician for an evaluation.  Remember:  Regular Business hours are:  Monday to Thursday 8:00 AM to 4:00 PM  Provider's Schedule: Milinda Pointer, MD:  Procedure days: Tuesday and Thursday 7:30 AM to 4:00 PM  Gillis Santa, MD:  Procedure days: Monday and Wednesday 7:30 AM to 4:00 PM ______________________________________________________________________  Facet Joint Block The facet joints connect the bones of the spine (vertebrae). They make it possible for you to bend, twist, and make other movements with your spine. They also keep you from bending too far, twisting too far, and making other extreme movements. A facet joint block is a procedure in which a numbing medicine (anesthetic) is injected into a facet joint. In many cases, an anti-inflammatory medicine (steroid) is also injected. A facet joint block may be done: To diagnose neck or back pain. If the pain gets better after a facet joint block, it means the pain is probably coming from the facet joint. If the pain does not get better, it means the pain is probably not coming from the facet joint. To relieve neck or back pain that is caused by an inflamed facet joint. A facet joint block is only done to relieve pain if the pain does not improve with other methods, such as medicine, exercise programs, and physical therapy. Tell a health care provider about: Any allergies you have. All medicines you are taking, including vitamins, herbs, eye drops, creams, and over-the-counter medicines. Any problems you or family members have had with anesthetic medicines. Any blood disorders you have. Any surgeries you have had. Any medical conditions you have or have had. Whether you are pregnant or may be pregnant. What are the risks? Generally, this is a safe procedure. However, problems may occur, including: Bleeding. Injury to a nerve near the injection  site. Pain at the injection site. Weakness or numbness in areas controlled by nerves near the injection site. Infection. Temporary fluid retention. Allergic reactions to medicines or dyes. Injury to other structures or organs near the injection site. What happens before the procedure? Medicines Ask your health care provider about: Changing or stopping your regular medicines. This is especially important if you are taking diabetes medicines or blood thinners. Taking medicines such as aspirin and ibuprofen. These medicines can thin your blood. Do not take these medicines unless your health care provider tells you to take them. Taking over-the-counter medicines, vitamins, herbs, and supplements. Eating and drinking Follow instructions from your health care provider about eating and drinking, which may include: 8 hours before the procedure - stop eating heavy meals or foods, such as meat, fried foods, or fatty foods. 6 hours before the procedure - stop eating light meals or foods, such as toast or cereal. 6 hours before the procedure - stop drinking milk or drinks that contain milk. 2 hours before the procedure - stop drinking clear liquids. Staying hydrated Follow instructions from your health care provider about hydration, which may include: Up to 2 hours before the procedure - you may continue to drink clear liquids, such as water, clear fruit juice, black coffee, and plain tea. General instructions Do not use any products that contain nicotine or tobacco for at least 4-6 weeks before the procedure. These products include cigarettes, e-cigarettes, and chewing tobacco. If you need help quitting, ask your health care provider. Plan to have someone take you home from the hospital or clinic. Ask your health care provider: How your surgery  site will be marked. What steps will be taken to help prevent infection. These may include: Removing hair at the surgery site. Washing skin with a  germ-killing soap. Receiving antibiotic medicine. What happens during the procedure?  You will put on a hospital gown. You will lie on your stomach on an X-ray table. You may be asked to lie in a different position if an injection will be made in your neck. Machines will be used to monitor your oxygen levels, heart rate, and blood pressure. Your skin will be cleaned. If an injection will be made in your neck, an IV will be inserted into one of your veins. Fluids and medicine will flow directly into your body through the IV. A numbing medicine (local anesthetic) will be applied to your skin. Your skin may sting or burn for a moment. A video X-ray machine (fluoroscopy) will be used to find the joint. In some cases, a CT scan may be used. A contrast dye may be injected into the facet joint area to help find the joint. When the joint is located, an anesthetic will be injected into the joint through the needle. Your health care provider will ask you whether you feel pain relief. If you feel relief, a steroid may be injected to provide pain relief for a longer period of time. If you do not feel relief or feel only partial relief, additional injections of an anesthetic may be made in other facet joints. The needle will be removed. Your skin will be cleaned. A bandage (dressing) will be applied over each injection site. The procedure may vary among health care providers and hospitals. What happens after the procedure? Your blood pressure, heart rate, breathing rate, and blood oxygen level will be monitored until you leave the hospital or clinic. You will lie down and rest for a period of time. Summary A facet joint block is a procedure in which a numbing medicine (anesthetic) is injected into a facet joint. An anti-inflammatory medicine (stereoid) may also be injected. Follow instructions from your health care provider about medicines and eating and drinking before the procedure. Do not use any  products that contain nicotine or tobacco for at least 4-6 weeks before the procedure. You will lie on your stomach for the procedure, but you may be asked to lie in a different position if an injection will be made in your neck. When the joint is located, an anesthetic will be injected into the joint through the needle. This information is not intended to replace advice given to you by your health care provider. Make sure you discuss any questions you have with your health care provider. Document Revised: 12/21/2018 Document Reviewed: 08/04/2018 [employer] Patient Education  2022 [employer].

## 2021-10-26 NOTE — Progress Notes (Signed)
PROVIDER NOTE: Information contained herein reflects review and annotations entered in association with encounter. Interpretation of such information and data should be left to medically-trained personnel. Information provided to patient can be located elsewhere in the medical record under "Patient Instructions". Document created using STT-dictation technology, any transcriptional errors that may result from process are unintentional.    Patient: Karen Lewis  Service Category: E/M  Provider: Gaspar Cola, MD  DOB: 07-18-1971  DOS: 10/26/2021  Specialty: Interventional Pain Management  MRN: [id number]  Setting: Ambulatory outpatient  PCP: Idelle Crouch, MD  Type: Established Patient    Referring Provider: Idelle Crouch, MD  Location: Office  Delivery: Face-to-face     HPI  [name], a 51 y.o. year old female, is here today because of her Chronic pain syndrome [G89.4]. Ms. Haberl primary complain today is Back Pain (lower) and Knee Pain (bilateral) Last encounter: My last encounter with her was on 10/21/2021. Pertinent problems: Ms. Gavel has Fibromyalgia; Chronic knee pain (1ry area of Pain) (Bilateral) (R>L); Chronic low back pain (3ry area of Pain) (Bilateral) (R>L) w/o sciatica; Lumbar facet syndrome (Bilateral) (R>L); Secondary osteoarthritis of multiple sites; Grade 1 (1.4 cm) Anterolisthesis of L4 over L5; Chronic hip pain (2ry area of Pain) (Right); S/P THR (total hip replacement) (Left); Lumbar spondylosis; Chronic pain syndrome; Neurogenic pain; Upper extremity pain; Chronic shoulder pain (Left); Osteoarthritis of shoulder (Left); Osteoarthritis of hip (Right); Secondary Osteoarthritis of knee (Bilateral) (R>L); Spondylosis without myelopathy or radiculopathy, lumbosacral region; Panniculitis; Leg swelling; Edema of both legs; History of total hip replacement (Bilateral); S/P THR (total hip replacement) (Right) (11/27/2019); Pain and numbness of left upper extremity; Cervical  radiculitis (Left); DDD (degenerative disc disease), lumbosacral; Osteoarthritis of lumbar spine without myelopathy or radiculopathy; and Lumbosacral radiculopathy at S1 (Left) on their pertinent problem list. Pain Assessment: Severity of Chronic pain is reported as a 5 /10. Location: Back Right, Left/down side of legs to thighs, on left numbness to bottom of foot. Onset: More than a month ago. Quality: Aching, Constant, Discomfort, Numbness. Timing: Constant. Modifying factor(s): procedures, rest, medications, heat. Vitals:  height is 5' 3.5" (1.613 m) and weight is 342 lb (155.1 kg) (abnormal). Her temperature is 97.2 F (36.2 C) (abnormal). Her blood pressure is 109/66 and her pulse is 93. Her respiration is 16 and oxygen saturation is 97%.   Reason for encounter: medication management.   The patient indicates doing well with the current medication regimen. No adverse reactions or side effects reported to the medications.  Although the patient had attained such good relief of her low back pain from the last bilateral lumbar facet block, she refers having overdone it with significant walking that actually landed her in the cardiology department because of overexertion and angina.  She refers that she was trying to "show off" the fact that she was able to walk so well.  Unfortunately, now she returns having an increase in her low back pain where she is having difficulty straightening up.  Her primary pain is that of the lower back (Bilateral).  She is also experiencing pain down the right upper anterior thigh and numbness all the way down to the bottom of her left foot and what seems to be an S1 radiculitis.  In reviewing her medical records, her last x-ray of the lumbar spine was done on 03/01/2018.  I was unable to find any MRIs or CT scans of the lumbar spine.  At this point, I will order a  repeat x-ray of the lumbar spine on flexion and extension and I will also be ordering an MRI of the lumbar spine.  I  will have the patient return for a bilateral lumbar facet block which did help her, but she has again been warned that she needs to work on bringing her BMI down.  RTCB: 01/24/2022 Nonopioid transferred 06/24/2020: Lyrica  Pharmacotherapy Assessment  Analgesic: Oxycodone IR 5 mg 1 tab PO 5X/day (#150/mo) (25 mg/day) MME/day: 37.5 mg/day.   Monitoring: [place] PMP: PDMP reviewed during this encounter.       Pharmacotherapy: No side-effects or adverse reactions reported. Compliance: No problems identified. Effectiveness: Clinically acceptable.  Ignatius Specking, RN  10/26/2021  1:05 PM  Sign when Signing Visit Nursing Pain Medication Assessment:  Safety precautions to be maintained throughout the outpatient stay will include: orient to surroundings, keep bed in low position, maintain call bell within reach at all times, provide assistance with transfer out of bed and ambulation.  Medication Inspection Compliance: Pill count conducted under aseptic conditions, in front of the patient. Neither the pills nor the bottle was removed from the patient's sight at any time. Once count was completed pills were immediately returned to the patient in their original bottle.  Medication: Oxycodone IR Pill/Patch Count:  143/ of 150 pills remain Pill/Patch Appearance: Markings consistent with prescribed medication Bottle Appearance: Standard pharmacy container. Clearly labeled. Filled Date: 2/6 2023 Last Medication intake:  Yesterday    UDS:  Summary  Date Value Ref Range Status  02/19/2020 Note  Final    Comment:    ==================================================================== Tox[employer] Select 13 (MW) ==================================================================== Test                             Result       Flag       Units  Drug Present and Declared for Prescription Verification   7-aminoclonazepam              44           EXPECTED   ng/mg creat    7-aminoclonazepam is an expected  metabolite of clonazepam. Source of    clonazepam is a scheduled prescription medication.  Drug Present not Declared for Prescription Verification   Oxycodone                      199          UNEXPECTED ng/mg creat   Oxymorphone                    127          UNEXPECTED ng/mg creat   Noroxycodone                   1026         UNEXPECTED ng/mg creat   Noroxymorphone                 56           UNEXPECTED ng/mg creat    Sources of oxycodone are scheduled prescription medications.    Oxymorphone, noroxycodone, and noroxymorphone are expected    metabolites of oxycodone. Oxymorphone is also available as a    scheduled prescription medication.  Drug Absent but Declared for Prescription Verification   Butalbital                     Not Detected  UNEXPECTED ==================================================================== Test                      Result    Flag   Units      Ref Range   Creatinine              165              mg/dL      >=20 ==================================================================== Declared Medications:  The flagging and interpretation on this report are based on the  following declared medications.  Unexpected results may arise from  inaccuracies in the declared medications.   **Note: The testing scope of this panel includes these medications:   Butalbital (Fioricet)  Clonazepam (Klonopin)   **Note: The testing scope of this panel does not include the  following reported medications:   Acetaminophen (Tylenol)  Acetaminophen (Fioricet)  Albuterol (Ventolin HFA)  Amitriptyline (Elavil)  Biotin  Caffeine (Fioricet)  Calcium (Tums)  Diphenhydramine (Benadryl)  Epinephrine (EpiPen)  Eye Drops  Famotidine (Pepcid)  Levocetirizine (Xyzal)  Levothyroxine (Synthroid)  Lisinopril (Zestril)  Magnesium (Mag-Ox)  Meclizine (Antivert)  Medroxyprogesterone (Depo-Provera)  Metformin (Glucophage)  Metoprolol (Lopressor)  Montelukast (Singulair)   Multivitamin  Naloxone (Narcan)  Pregabalin (Lyrica)  Probiotic  Quetiapine (Seroquel)  Rosuvastatin (Crestor)  Sertraline (Zoloft)  Spironolactone (Aldactone)  Tizanidine (Zanaflex)  Topical  Topical Diclofenac  Triamcinolone (Nasacort)  Vitamin B  Vitamin B12  Vitamin C  Vitamin D2  Zolpidem (Ambien)  Zonisamide (Zonegran) ==================================================================== For clinical consultation, please call [phone]. ====================================================================      ROS  Constitutional: Denies any fever or chills Gastrointestinal: No reported hemesis, hematochezia, vomiting, or acute GI distress Musculoskeletal: Denies any acute onset joint swelling, redness, loss of ROM, or weakness Neurological: No reported episodes of acute onset apraxia, aphasia, dysarthria, agnosia, amnesia, paralysis, loss of coordination, or loss of consciousness  Medication Review  B-12, Biotin, Dupilumab, EPINEPHrine, QUEtiapine, Semaglutide(0.25 or 0.5MG/DOS), Tab-A-Vite, acetaminophen, acidophilus, albuterol, amitriptyline, benzonatate, butalbital-acetaminophen-caffeine, calcium carbonate, clonazePAM, diclofenac sodium, diphenhydrAMINE, ergocalciferol, famotidine, fluticasone furoate-vilanterol, furosemide, glucose blood, guaiFENesin-codeine, levocetirizine, levothyroxine, lisinopril, magnesium oxide, meclizine, metFORMIN, metoprolol tartrate, montelukast, naloxone, naphazoline-pheniramine, naproxen sodium, nitrofurantoin (macrocrystal-monohydrate), omeprazole, oxyCODONE, pregabalin, rizatriptan, rosuvastatin, sertraline, spironolactone, tiZANidine, vitamin C, and zolpidem  History Review  Allergy: Ms. Silverio is allergic to bactrim [sulfamethoxazole-trimethoprim], omalizumab, ciprofloxacin, shellfish allergy, and nsaids. Drug: Ms. Skelton  reports no history of drug use. Alcohol:  reports no history of alcohol use. Tobacco:  reports that she has never  smoked. She has never used smokeless tobacco. Social: Ms. Doughman  reports that she has never smoked. She has never used smokeless tobacco. She reports that she does not drink alcohol and does not use drugs. Medical:  has a past medical history of Anemia, Anginal pain ([place]), Anxiety, Arthralgia of hip (07/29/2015), Arthritis, Arthritis, degenerative (07/29/2015), Asthma, Cephalalgia (07/25/2014), Dependence on unknown drug ([place]), Depression, Diabetes mellitus without complication ([place]), Difficult intubation, Dysrhythmia, Eczema, Fibromyalgia, Gastritis, GERD (gastroesophageal reflux disease), Gonalgia (07/29/2015), Gout, H/O cardiovascular disorder (03/10/2015), H/O surgical procedure (12/05/2012), H/O thyroid disease (03/10/2015), Headache, Herpes, History of artificial joint (07/29/2015), History of hiatal hernia, Hypertension, Hypomagnesemia, Hypothyroidism, IDA (iron deficiency anemia) (05/28/2019), LBP (low back pain) (07/29/2015), Neuromuscular disorder ([place]), Obesity, PCOS (polycystic ovarian syndrome), Primary osteoarthritis of both knees (07/29/2015), Sleep apnea, Thyroid nodule (bilateral), and Umbilical hernia. Surgical: Ms. Matuska  has a past surgical history that includes Laparoscopic partial gastrectomy; Shoulder arthroscopy (Right); Carpal tunnel release (Bilateral); Diagnostic laparoscopy; Cholecystectomy; Trigger finger release (Right); Thyroidectomy (N/A, 11/12/2015); left  trigger finger; Roux-en-Y Gastric Bypass (06/03/2017); Hiatal hernia repair; peniculectomy (N/A, 07/05/2018); Total hip arthroplasty (Right, 11/27/2019); Joint replacement (Bilateral, hip); Appendectomy; Trigger finger release (Right, 04/24/2020); Colonoscopy with propofol (N/A, 02/11/2021); and Esophagogastroduodenoscopy (egd) with propofol (N/A, 02/11/2021). Family: family history includes Alcohol abuse in her father and mother; Anxiety disorder in her father and mother; Brain cancer (age of onset: 37) in her paternal uncle; Breast cancer  (age of onset: 49) in her paternal aunt and paternal aunt; COPD in her father; Depression in her brother, father, and mother; Diabetes in her brother, father, and mother; Hypertension in her brother, father, and mother; Kidney cancer in her mother; Kidney failure in her father; Post-traumatic stress disorder in her father; Sleep apnea in her brother, father, and mother.  Laboratory Chemistry Profile   Renal Lab Results  Component Value Date   BUN 19 04/28/2021   CREATININE 1.06 (H) 04/28/2021   GFRAA >60 04/16/2020   GFRNONAA >60 04/28/2021    Hepatic Lab Results  Component Value Date   AST 34 04/22/2021   ALT 34 04/22/2021   ALBUMIN 4.1 04/22/2021   ALKPHOS 63 04/22/2021    Electrolytes Lab Results  Component Value Date   NA 134 (L) 04/28/2021   K 4.5 04/28/2021   CL 101 04/28/2021   CALCIUM 8.4 (L) 04/28/2021   MG 2.1 04/28/2021   PHOS 5.5 (H) 05/22/2019    Bone Lab Results  Component Value Date   VD25OH 27.4 (L) 11/24/2015   VD125OH2TOT 41.5 11/24/2015    Inflammation (CRP: Acute Phase) (ESR: Chronic Phase) Lab Results  Component Value Date   CRP 0.7 09/27/2019   ESRSEDRATE 30 (H) 09/27/2019         Note: Above Lab results reviewed.  Recent Imaging Review  DG Bone Density EXAM: DUAL X-RAY ABSORPTIOMETRY (DXA) FOR BONE MINERAL DENSITY  IMPRESSION: Your patient Lauralie Blacksher completed a BMD test on 09/21/2021 using the [employer] (software version: 14.10) manufactured by [employer]. The following summarizes the results of our evaluation. Technologist: SCE PATIENT BIOGRAPHICAL: Name: Brylinn, Teaney Patient ID: [id number] Birth Date: Aug 13, 1971 Height: 63.5 in. Gender: Female Exam Date: 09/21/2021 Weight: 353.8 lbs. Indications: Asthma, Bilateral Hip Replacements, Diabetic, Family Hx of Osteoporosis, Height Loss, History of Fracture (Adult), PCOS, Postmenopausal, Thyroid Removal, Vitamin D Deficiency Fractures: Left foot, Ribs, Right foot,  Right shoulder Treatments: breo-Ellipta, Calcium, Levothyroxine, Metformin, Multi-Vitamin, Omeprazole, Ozempic Injection, pepcid, Singulair, Vitamin D DENSITOMETRY RESULTS: Site         Region     Measured Date Measured Age WHO Classification Young Adult T-score BMD         %Change vs. Previous Significant Change (*)  AP Spine L1-L2 09/21/2021 51.2 Normal -0.3 1.143 g/cm2 - -  Left Forearm Radius 33% 09/21/2021 51.2 Normal 1.9 1.041 g/cm2 - -  ASSESSMENT: The BMD measured at AP Spine L1-L2 is 1.143 g/cm2 with a T-score of -0.3. This patient is considered NORMAL according to [facility]) criteria. The scan quality is good. L3 and L4 was excluded due to degenerative changes. Hips were not utilized due to prior BILATERAL hip arthroplasty.  World Pharmacologist [facility]) criteria for post-menopausal, Caucasian Women: Normal:                   T-score at or above -1 SD Osteopenia/low bone mass: T-score between -1 and -2.5 SD Osteoporosis:             T-score at or below -2.5  SD  RECOMMENDATIONS:  1. All patients should optimize calcium and vitamin D intake. 2. Consider FDA-approved medical therapies in postmenopausal women and men aged 63 years and older, based on the following: a. A hip or vertebral(clinical or morphometric) fracture b. T-score < -2.5 at the femoral neck or spine after appropriate evaluation to exclude secondary causes c. Low bone mass (T-score between -1.0 and -2.5 at the femoral neck or spine) and a 10-year probability of a hip fracture > 3% or a 10-year probability of a major osteoporosis-related fracture > 20% based on the [place]-adapted WHO algorithm 3. Clinician judgment and/or patient preferences may indicate treatment for people with 10-year fracture probabilities above or below these levels  FOLLOW-UP:  People with diagnosed cases of osteoporosis or at high risk for fracture should have regular bone mineral density tests.  For patients eligible for Medicare, routine testing is allowed once every 2 years. The testing frequency can be increased to one year for patients who have rapidly progressing disease, those who are receiving or discontinuing medical therapy to restore bone mass, or have additional risk factors.  I have reviewed this report, and agree with the above findings. Mark A. Thornton Papas, M.D. [facility]  Electronically Signed   By: Lavonia Dana M.D.   On: 09/21/2021 15:21 Note: Reviewed        Physical Exam  General appearance: Well nourished, well developed, and well hydrated. In no apparent acute distress Mental status: Alert, oriented x 3 (person, place, & time)       Respiratory: No evidence of acute respiratory distress Eyes: PERLA Vitals: BP 109/66    Pulse 93    Temp (!) 97.2 F (36.2 C)    Resp 16    Ht 5' 3.5" (1.613 m)    Wt (!) 342 lb (155.1 kg)    SpO2 97%    BMI 59.63 kg/m  BMI: Estimated body mass index is 59.63 kg/m as calculated from the following:   Height as of this encounter: 5' 3.5" (1.613 m).   Weight as of this encounter: 342 lb (155.1 kg). Ideal: Ideal body weight: 53.5 kg (118 lb 0.9 oz) Adjusted ideal body weight: 94.2 kg (207 lb 10.1 oz)  Assessment   Status Diagnosis  Controlled Controlled Controlled 1. Chronic pain syndrome   2. Chronic knee pain (1ry area of Pain) (Bilateral) (R>L)   3. Chronic hip pain (2ry area of Pain) (Right)   4. Chronic low back pain (3ry area of Pain) (Bilateral) (R>L) w/o sciatica   5. Secondary osteoarthritis of multiple sites   6. Pharmacologic therapy   7. Chronic use of opiate for therapeutic purpose   8. Encounter for medication management   9. DDD (degenerative disc disease), lumbosacral   10. Grade 1 (1.4 cm) Anterolisthesis of L4 over L5   11. Lumbosacral radiculopathy at S1 (Left)   12. Lumbar facet syndrome (Bilateral) (R>L)      Updated Problems: Problem  Lumbosacral radiculopathy at S1 (Left)     Plan of Care  Problem-specific:  No problem-specific Assessment & Plan notes found for this encounter.  Ms. YASHA TIBBETT has a current medication list which includes the following long-term medication(s): albuterol, clonazepam, diphenhydramine, furosemide, magnesium oxide, metformin, metoprolol tartrate, montelukast, naloxone, oxycodone, [START ON 11/25/2021] oxycodone, [START ON 12/25/2021] oxycodone, rizatriptan, rizatriptan, spironolactone, levothyroxine, lisinopril, and pregabalin.  Pharmacotherapy (Medications Ordered): Meds ordered this encounter  Medications   oxyCODONE (OXY IR/ROXICODONE) 5 MG immediate release tablet    Sig: Take  1 tablet (5 mg total) by mouth 5 (five) times daily. Must last 30 days    Dispense:  150 tablet    Refill:  0    Not a duplicate. Do NOT delete! Dispense 1 day early if closed on refill date. Avoid benzodiazepines within 8 hours of opioids. Do not send refill requests.   oxyCODONE (OXY IR/ROXICODONE) 5 MG immediate release tablet    Sig: Take 1 tablet (5 mg total) by mouth 5 (five) times daily. Must last 30 days    Dispense:  150 tablet    Refill:  0    Not a duplicate. Do NOT delete! Dispense 1 day early if closed on refill date. Avoid benzodiazepines within 8 hours of opioids. Do not send refill requests.   oxyCODONE (OXY IR/ROXICODONE) 5 MG immediate release tablet    Sig: Take 1 tablet (5 mg total) by mouth 5 (five) times daily. Must last 30 days    Dispense:  150 tablet    Refill:  0    Not a duplicate. Do NOT delete! Dispense 1 day early if closed on refill date. Avoid benzodiazepines within 8 hours of opioids. Do not send refill requests.   Orders:  Orders Placed This Encounter  Procedures   LUMBAR FACET(MEDIAL BRANCH NERVE BLOCK) MBNB    Standing Status:   Future    Standing Expiration Date:   01/23/2022    Scheduling Instructions:     Procedure: Lumbar facet block (AKA.: Lumbosacral medial branch nerve block)     Side: Bilateral      Level: L3-4 & L5-S1 Facets (L2, L3, L4, L5, & S1 Medial Branch Nerves)     Sedation: Patient's choice.     Timeframe: ASAA    Order Specific Question:   Where will this procedure be performed?    Answer:   [facility]   DG Lumbar Spine Complete W/Bend    Patient presents with axial pain with possible radicular component. Please assist [place] in identifying specific level(s) and laterality of any additional findings such as: 1. Facet (Zygapophyseal) joint DJD (Hypertrophy, space narrowing, subchondral sclerosis, and/or osteophyte formation) 2. DDD and/or IVDD (Loss of disc height, desiccation, gas patterns, osteophytes, endplate sclerosis, or "Black disc disease") 3. Pars defects 4. Spondylolisthesis, spondylosis, and/or spondyloarthropathies (include Degree/Grade of displacement in mm) (stability) 5. Vertebral body Fractures (acute/chronic) (state percentage of collapse) 6. Demineralization (osteopenia/osteoporotic) 7. Bone pathology 8. Foraminal narrowing  9. Surgical changes    Standing Status:   Future    Standing Expiration Date:   11/23/2021    Scheduling Instructions:     Imaging must be done as soon as possible. Inform patient that order will expire within 30 days and I will not renew it.    Order Specific Question:   Reason for Exam (SYMPTOM  OR DIAGNOSIS REQUIRED)    Answer:   Low back pain    Order Specific Question:   Is patient pregnant?    Answer:   No    Order Specific Question:   Preferred imaging location?    Answer:   [facility]    Order Specific Question:   Call Results- Best Contact Number?    Answer:   [phone] ([facility])    Order Specific Question:   Radiology Contrast Protocol - do NOT remove file path    Answer:   [email or web address]    Order Specific Question:   Release to patient    Answer:   Immediate   MR  LUMBAR SPINE WO CONTRAST    Patient presents with axial pain with possible radicular  component. Please assist [place] in identifying specific level(s) and laterality of any additional findings such as: 1. Facet (Zygapophyseal) joint DJD (Hypertrophy, space narrowing, subchondral sclerosis, and/or osteophyte formation) 2. DDD and/or IVDD (Loss of disc height, desiccation, gas patterns, osteophytes, endplate sclerosis, or "Black disc disease") 3. Pars defects 4. Spondylolisthesis, spondylosis, and/or spondyloarthropathies (include Degree/Grade of displacement in mm) (stability) 5. Vertebral body Fractures (acute/chronic) (state percentage of collapse) 6. Demineralization (osteopenia/osteoporotic) 7. Bone pathology 8. Foraminal narrowing  9. Surgical changes 10. Central, Lateral Recess, and/or Foraminal Stenosis (include AP diameter of stenosis in mm) 11. Surgical changes (hardware type, status, and presence of fibrosis) 12. Modic Type Changes (MRI only) 13. IVDD (Disc bulge, protrusion, herniation, extrusion) (Level, laterality, extent)    Standing Status:   Future    Standing Expiration Date:   11/23/2021    Scheduling Instructions:     Imaging must be done as soon as possible. Inform patient that order will expire within 30 days and I will not renew it.    Order Specific Question:   What is the patient's sedation requirement?    Answer:   No Sedation    Order Specific Question:   Does the patient have a pacemaker or implanted devices?    Answer:   No    Order Specific Question:   Preferred imaging location?    Answer:   [facility] (table limit-350lbs)    Order Specific Question:   Call Results- Best Contact Number?    Answer:   [phone] ([facility])    Order Specific Question:   Radiology Contrast Protocol - do NOT remove file path    Answer:   \[email or web address]   ToxASSURE Select 13 (MW), Urine    Volume: 30 ml(s). Minimum 3 ml of urine is needed. Document temperature of fresh sample. Indications: Long term (current) use of  opiate analgesic (A54.098)    Order Specific Question:   Release to patient    Answer:   Immediate   Follow-up plan:   Return for (Clinic) procedure: (B) L-FCt Blk, (Sed-anx).     Interventional Therapies  Risk   Complexity Considerations:   Estimated body mass index is 60.59 kg/m as calculated from the following:   Height as of 06/18/21: '5\' 4"'  (1.626 m).   Weight as of 06/18/21: 353 lb (160.1 kg). NOTE: NO RFA until BMI less or equal to 35.  (Gastric bypass done on 06/03/2017) Iodine allergy  Contrast dye allergy  Shellfish allergy    Planned   Pending:      Under consideration:   Diagnostic bilateral genicular NB    Completed:   Therapeutic bilateral IA Zilretta knee injection x2 (08/04/2021) (100/100/75/75)  Therapeutic bilateral IA Hyalgan knee injection x16 (06/24/2020)  Therapeutic bilateral THR (total hip replacements) (Dr. Rudene Christians) (Right: 11/27/2019) (Left: 07/29/2015)  Palliative/therapeutic bilateral lumbar facet MBB x7 (08/25/2021) (100/100/60/60)  Therapeutic right IA hip injection x3 (07/07/2017)  Therapeutic left IA shoulder (glenohumeral joint) injection x1 (05/31/2017)    Therapeutic   Palliative (PRN) options:   Palliative bilateral knee injections  Palliative bilateral lumbar facet MBBs      Recent Visits Date Type Provider Dept  09/08/21 Office Visit Milinda Pointer, MD [facility]  08/25/21 Procedure visit Milinda Pointer, MD [facility]  08/18/21 Office Visit Milinda Pointer, MD [facility]  08/04/21 Procedure visit Milinda Pointer, MD [facility]  Showing recent  visits within past 90 days and meeting all other requirements Today's Visits Date Type Provider Dept  10/26/21 Office Visit Milinda Pointer, MD [facility]  Showing today's visits and meeting all other requirements Future Appointments No visits were found meeting these conditions. Showing future appointments within next 90 days  and meeting all other requirements  I discussed the assessment and treatment plan with the patient. The patient was provided an opportunity to ask questions and all were answered. The patient agreed with the plan and demonstrated an understanding of the instructions.  Patient advised to call back or seek an in-person evaluation if the symptoms or condition worsens.  Duration of encounter: 30 minutes.  Note by: Gaspar Cola, MD Date: 10/26/2021; Time: 2:07 PM

## 2021-10-26 NOTE — Progress Notes (Signed)
Hematology/Oncology Progress note [phone] [phone]      Patient Care Team: Idelle Crouch, MD as PCP - General (Internal Medicine) Jon Billings, RN as [place] [facility]  REFERRING PROVIDER: Idelle Crouch, MD CHIEF COMPLAINTS/REASON FOR VISIT:  Follow up  iron deficiency anemia  HISTORY OF PRESENTING ILLNESS:  Karen Lewis is a  51 y.o.  female with PMH listed below was seen in consultation at the request of Idelle Crouch, MD   for evaluation of iron deficiency anemia.   Reviewed patient's recent labs  Labs revealed anemia with hemoglobin of .   Reviewed patient's previous labs  05/16/2019 CBC showed hemoglobin 9.4, WBC 11.4, MCV 68.7, platelet count 352,000. Iron panel showed ferritin of 8, iron 34, folate 19.3, No aggravating or improving factors.  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  She follows up with ID Dr. Tama High.    Associated signs and symptoms: Patient reports fatigue.  Associated with SOB with exertion.  Denies weight loss, easy bruising, hematochezia, hemoptysis, hematuria. Context:  History of iron deficiency: Denies Rectal bleeding: Denies denies Menstrual bleeding/ Vaginal bleeding : Denies Hematemesis or hemoptysis : denies Blood in urine : denies  Last endoscopy: History of gastric sleeve.  And then history of gastric bypass on 09/21/ 2018 Fatigue: Yes.  SOB: Yes  +Chronic lower extremity swelling/lymphedema. Moved from [place] 10 years ago.  Previously worked as a Marine scientist.  INTERVAL HISTORY Karen Lewis is a 51 y.o. female who has above history reviewed by me today presents for follow up visit for management of iron deficiency anemia Patient receives maintenance Venofer every 6 months. Patient was seen by Faythe Casa 65-month ago. Today she reports feeling well. She needs to have MRI  of back done in the next few weeks.   Review of Systems  Constitutional:  Positive for fatigue. Negative for appetite change, chills and fever.  HENT:   Negative for hearing loss and voice change.   Eyes:  Negative for eye problems.  Respiratory:  Negative for chest tightness, cough and shortness of breath.   Cardiovascular:  Positive for leg swelling. Negative for chest pain.  Gastrointestinal:  Negative for abdominal distention, abdominal pain and blood in stool.  Endocrine: Negative for hot flashes.  Genitourinary:  Negative for difficulty urinating and frequency.   Musculoskeletal:  Positive for back pain. Negative for arthralgias.  Skin:  Negative for itching and rash.  Neurological:  Negative for extremity weakness.  Hematological:  Negative for adenopathy.  Psychiatric/Behavioral:  Negative for confusion.    MEDICAL HISTORY:  Past Medical History:  Diagnosis Date   Anemia    Anginal pain ([place])    Anxiety    Arthralgia of hip 07/29/2015   Arthritis    Arthritis, degenerative 07/29/2015   Asthma    Cephalalgia 07/25/2014   Dependence on unknown drug ([place])    multiplt controlled drug dependence   Depression    Diabetes mellitus without complication ([place])    Difficult intubation    per pt needs small tube (has had abrasions from tube in past)   Dysrhythmia    PVC's   Eczema    Fibromyalgia    Gastritis    GERD (gastroesophageal reflux disease)    Gonalgia 07/29/2015   Overview:  Overview:  The patient has had bilateral intra-articular Hyalgan injections done on 07/16/2014 and although she seems to do  well with this type of therapy, apparently her insurance company does not want to pay for they Hyalgan. On 11/27/2014 the patient underwent a bilateral genicular nerve block with excellent results. On 01/28/2015 she had a right knee genicular radiofrequency ablatio   Gout    H/O cardiovascular disorder 03/10/2015   H/O surgical procedure 12/05/2012   Overview:  LSG ([facility] -  April 2013)     H/O thyroid disease 03/10/2015   Headache    Herpes    History of artificial joint 07/29/2015   History of hiatal hernia    Hypertension    Hypomagnesemia    Hypothyroidism    IDA (iron deficiency anemia) 05/28/2019   LBP (low back pain) 07/29/2015   Neuromuscular disorder (HCC)    Obesity    PCOS (polycystic ovarian syndrome)    Primary osteoarthritis of both knees 07/29/2015   Sleep apnea    not using a Cpap machine at this time - most recent test mild apnea does not qualify for test (not OSA)   Thyroid nodule bilateral   Umbilical hernia     SURGICAL HISTORY: Past Surgical History:  Procedure Laterality Date   APPENDECTOMY     CARPAL TUNNEL RELEASE Bilateral    CHOLECYSTECTOMY     COLONOSCOPY WITH PROPOFOL N/A 02/11/2021   Procedure: COLONOSCOPY WITH PROPOFOL;  Surgeon: Toledo, Benay Pike, MD;  Location: [facility];  Service: Gastroenterology;  Laterality: N/A;   DIAGNOSTIC LAPAROSCOPY     ESOPHAGOGASTRODUODENOSCOPY (EGD) WITH PROPOFOL N/A 02/11/2021   Procedure: ESOPHAGOGASTRODUODENOSCOPY (EGD) WITH PROPOFOL;  Surgeon: Toledo, Benay Pike, MD;  Location: [facility];  Service: Gastroenterology;  Laterality: N/A;   HIATAL HERNIA REPAIR     JOINT REPLACEMENT Bilateral hip   LAPAROSCOPIC PARTIAL GASTRECTOMY     left trigger finger     peniculectomy N/A 07/05/2018   ROUX-EN-Y GASTRIC BYPASS  06/03/2017   SHOULDER ARTHROSCOPY Right    THYROIDECTOMY N/A 11/12/2015   Procedure: THYROIDECTOMY;  Surgeon: Clyde Canterbury, MD;  Location: [facility] OR[facility];  Service: ENT;  Laterality: N/A;   TOTAL HIP ARTHROPLASTY Right 11/27/2019   Procedure: TOTAL HIP ARTHROPLASTY ANTERIOR APPROACH;  Surgeon: Hessie Knows, MD;  Location: [facility];  Service: Orthopedics;  Laterality: Right;   TRIGGER FINGER RELEASE Right    TRIGGER FINGER RELEASE Right 04/24/2020   Procedure: Right ring and middle trigger release;  Surgeon: Hessie Knows, MD;  Location: [facility];  Service: Orthopedics;   Laterality: Right;    SOCIAL HISTORY: Social History   Socioeconomic History   Marital status: Married    Spouse name: Not on file   Number of children: 0   Years of education: 12   Highest education level: High school graduate  Occupational History   Occupation: Disabled  Tobacco Use   Smoking status: Never   Smokeless tobacco: Never  Vaping Use   Vaping Use: Never used  Substance and Sexual Activity   Alcohol use: No    Alcohol/week: 0.0 standard drinks   Drug use: No   Sexual activity: Not Currently    Birth control/protection: Injection  Other Topics Concern   Not on file  Social History Narrative   Not on file   Social Determinants of Health   Financial Resource Strain: Not on file  Food Insecurity: No Food Insecurity   Worried About Running Out of Food in the Last Year: Never true   Ran Out of Food in the Last Year: Never true  Transportation Needs: Not on file  Physical Activity: Not  on file  Stress: Not on file  Social Connections: Not on file  Intimate Partner Violence: Not on file    FAMILY HISTORY: Family History  Problem Relation Age of Onset   Anxiety disorder Mother    Depression Mother    Alcohol abuse Mother    Diabetes Mother    Hypertension Mother    Kidney cancer Mother    Sleep apnea Mother    Alcohol abuse Father    Anxiety disorder Father    Depression Father    Post-traumatic stress disorder Father    Kidney failure Father    COPD Father    Diabetes Father    Hypertension Father    Sleep apnea Father    Depression Brother    Diabetes Brother    Hypertension Brother    Sleep apnea Brother    Breast cancer Paternal Aunt 32   Breast cancer Paternal Aunt 55   Brain cancer Paternal Uncle 62   Bladder Cancer Neg Hx    Prostate cancer Neg Hx     ALLERGIES:  is allergic to bactrim [sulfamethoxazole-trimethoprim], omalizumab, ciprofloxacin, shellfish allergy, and nsaids.  MEDICATIONS:  Current Outpatient Medications   Medication Sig Dispense Refill   ACCU-CHEK AVIVA PLUS test strip      acetaminophen (TYLENOL) 650 MG CR tablet Take 650-1,300 mg by mouth every 8 (eight) hours as needed for pain.     acidophilus (RISAQUAD) CAPS capsule Take 1 capsule by mouth daily.     albuterol (PROVENTIL HFA;VENTOLIN HFA) 108 (90 BASE) MCG/ACT inhaler Inhale 2 puffs into the lungs every 6 (six) hours as needed for wheezing or shortness of breath.     amitriptyline (ELAVIL) 100 MG tablet Take 100 mg by mouth at bedtime.     Ascorbic Acid (VITAMIN C) 1000 MG tablet Take 1,000 mg by mouth daily.      Biotin 10 MG CAPS Take 10 mg by mouth daily.      butalbital-acetaminophen-caffeine (FIORICET) 50-325-40 MG tablet Take 1 tablet by mouth every 6 (six) hours as needed for migraine.      calcium carbonate (TUMS EX) 750 MG chewable tablet Chew 1 tablet by mouth daily.      clonazePAM (KLONOPIN) 0.5 MG tablet Take 1 tablet (0.5 mg total) by mouth 3 (three) times daily as needed for anxiety. 30 tablet 0   Cyanocobalamin (B-12) 2500 MCG TABS Take 2,500 mcg by mouth daily.     diclofenac sodium (VOLTAREN) 1 % GEL Apply 2 g topically 4 (four) times daily. (Patient taking differently: Apply 2 g topically 3 (three) times daily as needed (pain).) 100 g 1   diphenhydrAMINE (BENADRYL) 25 MG tablet Take 25 mg by mouth every 8 (eight) hours as needed for itching or allergies.      DUPIXENT 300 MG/2ML SOPN      ergocalciferol (VITAMIN D2) 1.25 MG (50000 UT) capsule Take 50,000 Units by mouth 2 (two) times a week.      famotidine (PEPCID) 20 MG tablet Take 20 mg by mouth at bedtime.     fluticasone furoate-vilanterol (BREO ELLIPTA) 200-25 MCG/INH AEPB Inhale 1 puff into the lungs daily.     furosemide (LASIX) 20 MG tablet Take 20 mg by mouth daily as needed.     glucose blood (ACCU-CHEK AVIVA PLUS) test strip Use 2 (two) times daily     levocetirizine (XYZAL) 5 MG tablet Take 5 mg by mouth at bedtime.      levothyroxine (SYNTHROID) 125 MCG  tablet Take  200 mcg by mouth daily before breakfast.     lisinopril (ZESTRIL) 5 MG tablet Take 5 mg by mouth at bedtime.      magnesium oxide (MAG-OX) 400 MG tablet Take 1,200 mg by mouth 2 (two) times daily. Take 1200 mg by mouth in the morning and 1200 mg at bedtime     meclizine (ANTIVERT) 25 MG tablet Take 25 mg by mouth 3 (three) times daily as needed for dizziness.      metFORMIN (GLUCOPHAGE) 1000 MG tablet Take 1,000 mg by mouth 2 (two) times daily with a meal.      metoprolol tartrate (LOPRESSOR) 50 MG tablet Take 75 mg by mouth 2 (two) times daily.      montelukast (SINGULAIR) 10 MG tablet Take 10 mg by mouth at bedtime.      Multiple Vitamin (TAB-A-VITE) TABS      naphazoline-pheniramine (NAPHCON-A) 0.025-0.3 % ophthalmic solution Place 1 drop into both eyes 4 (four) times daily as needed for eye irritation.     naproxen sodium (ALEVE) 220 MG tablet Take 220 mg by mouth as needed.     nitrofurantoin, macrocrystal-monohydrate, (MACROBID) 100 MG capsule Take 1 capsule (100 mg total) by mouth daily. 30 capsule 11   omeprazole (PRILOSEC) 40 MG capsule Take 40 mg by mouth in the morning and at bedtime.     oxyCODONE (OXY IR/ROXICODONE) 5 MG immediate release tablet Take 1 tablet (5 mg total) by mouth 5 (five) times daily. Must last 30 days 150 tablet 0   [START ON 11/25/2021] oxyCODONE (OXY IR/ROXICODONE) 5 MG immediate release tablet Take 1 tablet (5 mg total) by mouth 5 (five) times daily. Must last 30 days 150 tablet 0   OZEMPIC, 0.25 OR 0.5 MG/DOSE, 2 MG/1.5ML SOPN Inject 1 mg into the skin.     pregabalin (LYRICA) 225 MG capsule Take 1 capsule (225 mg total) by mouth 2 (two) times daily. 180 capsule 0   QUEtiapine (SEROQUEL) 100 MG tablet Take 100 mg by mouth at bedtime.     rizatriptan (MAXALT) 10 MG tablet Take 1 and may repeat in 2 hours for MIgraines, alternate with the disintrgrating tablet.     rizatriptan (MAXALT-MLT) 10 MG disintegrating tablet Take 10 mg by mouth. Take 1 and may  repeat in 2 hours  for Migraines     rosuvastatin (CRESTOR) 40 MG tablet Take 40 mg by mouth daily.      sertraline (ZOLOFT) 100 MG tablet Take 200 mg by mouth daily.      spironolactone (ALDACTONE) 25 MG tablet Take 37.5 mg by mouth 2 (two) times daily.      tiZANidine (ZANAFLEX) 4 MG tablet Take 1 tablet (4 mg total) by mouth 2 (two) times daily as needed for muscle spasms. 30 tablet 0   topiramate (TOPAMAX) 50 MG tablet Take 100 mg by mouth at bedtime.     zolpidem (AMBIEN) 10 MG tablet Take 10 mg by mouth at bedtime as needed for sleep.     benzonatate (TESSALON) 100 MG capsule Take 100 mg by mouth. (Patient not taking: Reported on 10/26/2021)     EPINEPHrine 0.3 mg/0.3 mL IJ SOAJ injection Inject 0.3 mg into the muscle as needed for anaphylaxis.  (Patient not taking: Reported on 10/26/2021)     guaiFENesin-codeine (ROBITUSSIN AC) 100-10 MG/5ML syrup Take by mouth as needed. (Patient not taking: Reported on 10/26/2021)     naloxone [facility]) nasal spray 4 mg/0.1 mL Place 1 spray into the nose as needed for  up to 365 doses (for opioid-induced respiratory depresssion). In case of emergency (overdose), spray once into each nostril. If no response within 3 minutes, repeat application and call 163. (Patient not taking: Reported on 10/26/2021) 1 each 0   [START ON 12/25/2021] oxyCODONE (OXY IR/ROXICODONE) 5 MG immediate release tablet Take 1 tablet (5 mg total) by mouth 5 (five) times daily. Must last 30 days (Patient not taking: Reported on 10/26/2021) 150 tablet 0   No current facility-administered medications for this visit.     PHYSICAL EXAMINATION: ECOG PERFORMANCE STATUS: 1 - Symptomatic but completely ambulatory Vitals:   10/26/21 1415  BP: 113/74  Pulse: 83  Resp: 18  Temp: (!) 96.6 F (35.9 C)   Filed Weights   10/26/21 1415  Weight: (!) 360 lb 3.2 oz (163.4 kg)    Physical Exam Constitutional:      General: She is not in acute distress.    Appearance: She is obese.  HENT:      Head: Normocephalic and atraumatic.  Eyes:     General: No scleral icterus.    Pupils: Pupils are equal, round, and reactive to light.  Cardiovascular:     Rate and Rhythm: Normal rate and regular rhythm.     Heart sounds: Normal heart sounds.  Pulmonary:     Effort: Pulmonary effort is normal. No respiratory distress.     Breath sounds: No wheezing.  Abdominal:     General: Bowel sounds are normal.     Palpations: Abdomen is soft.  Musculoskeletal:        General: No deformity. Normal range of motion.     Cervical back: Normal range of motion and neck supple.  Skin:    General: Skin is warm and dry.  Neurological:     Mental Status: She is alert and oriented to person, place, and time.     Cranial Nerves: No cranial nerve deficit.     Coordination: Coordination normal.  Psychiatric:        Mood and Affect: Mood normal.      CMP Latest Ref Rng & Units 04/28/2021  Glucose 70 - 99 mg/dL 108(H)  BUN 6 - 20 mg/dL 19  Creatinine 0.44 - 1.00 mg/dL 1.06(H)  Sodium 135 - 145 mmol/L 134(L)  Potassium 3.5 - 5.1 mmol/L 4.5  Chloride 98 - 111 mmol/L 101  CO2 22 - 32 mmol/L 22  Calcium 8.9 - 10.3 mg/dL 8.4(L)  Total Protein 6.5 - 8.1 g/dL -  Total Bilirubin 0.3 - 1.2 mg/dL -  Alkaline Phos 38 - 126 U/L -  AST 15 - 41 U/L -  ALT 0 - 44 U/L -   CBC Latest Ref Rng & Units 10/26/2021  WBC 4.0 - 10.5 K/uL 9.9  Hemoglobin 12.0 - 15.0 g/dL 13.1  Hematocrit 36.0 - 46.0 % 40.5  Platelets 150 - 400 K/uL 315     LABORATORY DATA:  I have reviewed the data as listed Lab Results  Component Value Date   WBC 9.9 10/26/2021   HGB 13.1 10/26/2021   HCT 40.5 10/26/2021   MCV 87.5 10/26/2021   PLT 315 10/26/2021   Recent Labs    04/22/21 1139 04/28/21 1939  NA 132* 134*  K 3.8 4.5  CL 99 101  CO2 22 22  GLUCOSE 144* 108*  BUN 32* 19  CREATININE 1.20* 1.06*  CALCIUM 8.9 8.4*  GFRNONAA 55* >60  PROT 7.4  --   ALBUMIN 4.1  --   AST 34  --  ALT 34  --   ALKPHOS 63  --    BILITOT 0.6  --     Iron/TIBC/Ferritin/ %Sat    Component Value Date/Time   IRON 66 10/26/2021 1353   TIBC 459 (H) 10/26/2021 1353   FERRITIN 35 10/26/2021 1353   IRONPCTSAT 14 10/26/2021 1353     No results found.    ASSESSMENT & PLAN:  1. Other iron deficiency anemia   2. History of gastric bypass    #Iron deficiency anemia in the context of History of gastric bypass Labs reviewed and discussed with patient Iron panel resulted after patient's visit.  Ferritin is 35, iron saturation 14.  TIBC chronically elevated at 459. I recommend patient to proceed with maintenance Venofer 200 mg x 1.  Given that she is going to have MRI spine done in the next few weeks.  We will hold off Venofer until she finishes her MRI patient agrees with the plan.  She will call after MRI is completed.   Vitamin B12 level is pending.  Continue vitamin B12 supplementation.  Follow-up in 7 months.  Labs prior CBC, iron TIBC ferritin, B12.  All questions were answered. The patient knows to call the clinic with any problems questions or concerns.  Cc Sparks, Leonie Douglas, MD    Earlie Server, MD, PhD Hematology Oncology  10/26/2021

## 2021-10-26 NOTE — Progress Notes (Signed)
Nursing Pain Medication Assessment:  Safety precautions to be maintained throughout the outpatient stay will include: orient to surroundings, keep bed in low position, maintain call bell within reach at all times, provide assistance with transfer out of bed and ambulation.  Medication Inspection Compliance: Pill count conducted under aseptic conditions, in front of the patient. Neither the pills nor the bottle was removed from the patient's sight at any time. Once count was completed pills were immediately returned to the patient in their original bottle.  Medication: Oxycodone IR Pill/Patch Count:  143/ of 150 pills remain Pill/Patch Appearance: Markings consistent with prescribed medication Bottle Appearance: Standard pharmacy container. Clearly labeled. Filled Date: 2/6 2023 Last Medication intake:  Yesterday

## 2021-10-30 LAB — TOXASSURE SELECT 13 (MW), URINE

## 2021-11-03 ENCOUNTER — Other Ambulatory Visit: Payer: Self-pay

## 2021-11-03 NOTE — Patient Outreach (Signed)
[facility]) Care Management  11/03/2021  Karen Lewis May 01, 1971 [id number]   Telephone call to patient for disease management follow up. Patient reports she is doing good. Last A1c was 6.5. Patient walking more and trying to build endurance.  Blood sugars in the low 100's. Encouraged her to continue the great work.    Patient Care Plan: Diabetes Type 2 (Adult)     Problem Identified: Disease Progression (Diabetes, Type 2)      Long-Range Goal: Disease Progression Prevented or Minimized as evidneced by A1c less than 8.0   Start Date: 09/30/2020  Expected End Date: 03/12/2022  This Visit's Progress: On track  Recent Progress: On track  Priority: High  Note:   Evidence-based guidance:  Prepare patient for laboratory and diagnostic exams based on risk and presentation.  Encourage lifestyle changes, such as increased intake of plant-based foods, stress reduction, consistent physical activity and smoking cessation to prevent long-term complications and chronic disease.   Individualize activity and exercise recommendations while considering potential limitations, such as neuropathy, retinopathy or the ability to prevent hyperglycemia or hypoglycemia.   Prepare patient for use of pharmacologic therapy that may include antihypertensive, analgesic, prostaglandin E1 with periodic adjustments, based on presenting chronic condition and laboratory results.  Assess signs/symptoms and risk factors for hypertension, sleep-disordered breathing, neuropathy (including changes in gait and balance), retinopathy, nephropathy and sexual dysfunction.  Address pregnancy planning and contraceptive choice, especially when prescribing antihypertensive or statin.  Ensure completion of annual comprehensive foot exam and dilated eye exam.   Implement additional individualized goals and interventions based on identified risk factors.  Prepare patient for consultation or referral for specialist care,  such as ophthalmology, neurology, cardiology, podiatry, nephrology or perinatology.   Notes:     Task: Monitor and Manage Follow-up for Comorbidities   Due Date: 03/12/2022  Priority: Routine  Responsible User: Jon Billings, RN  Note:   Care Management Activities:    - healthy lifestyle promoted - reduction of sedentary activity encouraged - response to pharmacologic therapy monitored    Notes: 12/19/20 Patient A1c 8.5. Patient started on ozempic last month  06/18/21 Patient Blood sugars lower.  Last sugar this am 109.  A1c 7.0 with ozempic.  Diabetes Management Discussed: Medication adherence Reviewed importance of limiting carbs such as rice, potatoes, breads, and pastas. Also discussed limiting sweets and sugary drinks.  Discussed importance of portion control.  Also discussed importance of annual exams, foot exams, and eye exams.        Patient Care Plan: RN Care Manager Plan of Care     Problem Identified: Chronic Disease Management and Care Coordination needs for diabetes   Priority: High     Long-Range Goal: Development of plan of care for management of diabetes   Start Date: 11/03/2021  Expected End Date: 09/12/2022  Priority: High  Note:   Current Barriers:  Chronic Disease Management support and education needs related to DMII   RNCM Clinical Goal(s):  Patient will verbalize basic understanding of  DMII disease process and self health management plan as evidenced by A1c less than 7.0 continue to work with RN Care Manager to address care management and care coordination needs related to  DMII as evidenced by adherence to CM Team Scheduled appointments through collaboration with RN Care manager, provider, and care team.   Interventions: Education and support related to diabetes Inter-disciplinary care team collaboration (see longitudinal plan of care) Evaluation of current treatment plan related to  self management and patient's  adherence to plan as established by  provider   Diabetes Interventions:  (Status:  New goal.) Long Term Goal Assessed patient's understanding of A1c goal: <7% Provided education to patient about basic DM disease process Discussed plans with patient for ongoing care management follow up and provided patient with direct contact information for care management team Review of patient status, including review of consultants reports, relevant laboratory and other test results, and medications completed Lab Results  Component Value Date   HGBA1C 7.1 (H) 11/27/2019  Last A1c reported 07/12/21- 6.5  Patient Goals/Self-Care Activities: Take all medications as prescribed Attend all scheduled provider appointments check blood sugar at prescribed times: once daily enter blood sugar readings and medication or insulin into daily log take the blood sugar log to all doctor visits limit fast food meals to no more than 1 per week  Follow Up Plan:  Telephone follow up appointment with care management team member scheduled for:  May The patient has been provided with contact information for the care management team and has been advised to call with any health related questions or concerns.      Plan: RN CM will contact patient again in May.  Patient agreeable to care plan and follow up.    Jone Baseman, RN, MSN [facility] Care Management Care Management Coordinator Direct Line [phone] Toll Free: [phone]  Fax: [phone]

## 2021-11-03 NOTE — Patient Instructions (Signed)
Patient Goals/Self-Care Activities: Take all medications as prescribed Attend all scheduled provider appointments check blood sugar at prescribed times: once daily enter blood sugar readings and medication or insulin into daily log take the blood sugar log to all doctor visits limit fast food meals to no more than 1 per week

## 2021-11-04 ENCOUNTER — Ambulatory Visit: Payer: Self-pay

## 2021-11-09 ENCOUNTER — Ambulatory Visit
Admission: RE | Admit: 2021-11-09 | Discharge: 2021-11-09 | Disposition: A | Discharge status: Home / Self Care | Payer: Medicare HMO | Source: Ambulatory Visit | Attending: Pain Medicine | Admitting: Pain Medicine

## 2021-11-09 DIAGNOSIS — M4310 Spondylolisthesis, site unspecified: Secondary | ICD-10-CM | POA: Insufficient documentation

## 2021-11-09 DIAGNOSIS — G8929 Other chronic pain: Secondary | ICD-10-CM | POA: Insufficient documentation

## 2021-11-09 DIAGNOSIS — M5417 Radiculopathy, lumbosacral region: Secondary | ICD-10-CM | POA: Diagnosis not present

## 2021-11-09 DIAGNOSIS — M5137 Other intervertebral disc degeneration, lumbosacral region: Secondary | ICD-10-CM | POA: Diagnosis not present

## 2021-11-09 DIAGNOSIS — M545 Low back pain, unspecified: Secondary | ICD-10-CM | POA: Insufficient documentation

## 2021-11-09 DIAGNOSIS — R200 Anesthesia of skin: Secondary | ICD-10-CM | POA: Diagnosis not present

## 2021-11-09 DIAGNOSIS — M4316 Spondylolisthesis, lumbar region: Secondary | ICD-10-CM | POA: Diagnosis not present

## 2021-11-09 DIAGNOSIS — R399 Unspecified symptoms and signs involving the genitourinary system: Secondary | ICD-10-CM | POA: Diagnosis not present

## 2021-11-17 ENCOUNTER — Ambulatory Visit (HOSPITAL_BASED_OUTPATIENT_CLINIC_OR_DEPARTMENT_OTHER): Payer: Medicare HMO | Admitting: Pain Medicine

## 2021-11-17 ENCOUNTER — Encounter: Payer: Self-pay | Admitting: Pain Medicine

## 2021-11-17 ENCOUNTER — Other Ambulatory Visit: Payer: Self-pay

## 2021-11-17 ENCOUNTER — Ambulatory Visit
Admission: RE | Admit: 2021-11-17 | Discharge: 2021-11-17 | Disposition: A | Discharge status: Home / Self Care | Payer: Medicare HMO | Source: Ambulatory Visit | Attending: Pain Medicine | Admitting: Pain Medicine

## 2021-11-17 VITALS — BP 121/70 | HR 83 | Temp 97.1°F | Resp 16 | Ht 63.0 in | Wt 362.0 lb

## 2021-11-17 DIAGNOSIS — M47816 Spondylosis without myelopathy or radiculopathy, lumbar region: Secondary | ICD-10-CM | POA: Insufficient documentation

## 2021-11-17 DIAGNOSIS — M4310 Spondylolisthesis, site unspecified: Secondary | ICD-10-CM | POA: Diagnosis not present

## 2021-11-17 DIAGNOSIS — Z6841 Body Mass Index (BMI) 40.0 and over, adult: Secondary | ICD-10-CM | POA: Diagnosis not present

## 2021-11-17 DIAGNOSIS — M545 Low back pain, unspecified: Secondary | ICD-10-CM | POA: Diagnosis not present

## 2021-11-17 DIAGNOSIS — M47817 Spondylosis without myelopathy or radiculopathy, lumbosacral region: Secondary | ICD-10-CM

## 2021-11-17 DIAGNOSIS — M5137 Other intervertebral disc degeneration, lumbosacral region: Secondary | ICD-10-CM | POA: Diagnosis not present

## 2021-11-17 DIAGNOSIS — G8929 Other chronic pain: Secondary | ICD-10-CM

## 2021-11-17 MED ORDER — MIDAZOLAM HCL 5 MG/5ML IJ SOLN
INTRAMUSCULAR | Status: AC
  Filled 2021-11-17: qty 5

## 2021-11-17 MED ORDER — TRIAMCINOLONE ACETONIDE 40 MG/ML IJ SUSP
INTRAMUSCULAR | Status: AC
  Filled 2021-11-17: qty 2

## 2021-11-17 MED ORDER — TRIAMCINOLONE ACETONIDE 40 MG/ML IJ SUSP
80.0000 mg | Freq: Once | INTRAMUSCULAR | Status: AC
  Administered 2021-11-17: 80 mg

## 2021-11-17 MED ORDER — LIDOCAINE HCL 2 % IJ SOLN
20.0000 mL | Freq: Once | INTRAMUSCULAR | Status: AC
  Administered 2021-11-17: 400 mg

## 2021-11-17 MED ORDER — ROPIVACAINE HCL 2 MG/ML IJ SOLN
18.0000 mL | Freq: Once | INTRAMUSCULAR | Status: AC
  Administered 2021-11-17: 18 mL via PERINEURAL

## 2021-11-17 MED ORDER — ROPIVACAINE HCL 2 MG/ML IJ SOLN
INTRAMUSCULAR | Status: AC
  Filled 2021-11-17: qty 20

## 2021-11-17 MED ORDER — LIDOCAINE HCL 2 % IJ SOLN
INTRAMUSCULAR | Status: AC
  Filled 2021-11-17: qty 20

## 2021-11-17 MED ORDER — LACTATED RINGERS IV SOLN
1000.0000 mL | Freq: Once | INTRAVENOUS | Status: AC
  Administered 2021-11-17: 1000 mL via INTRAVENOUS

## 2021-11-17 MED ORDER — MIDAZOLAM HCL 5 MG/5ML IJ SOLN
0.5000 mg | Freq: Once | INTRAMUSCULAR | Status: AC
  Administered 2021-11-17: 2 mg via INTRAVENOUS

## 2021-11-17 MED ORDER — PENTAFLUOROPROP-TETRAFLUOROETH EX AERO
INHALATION_SPRAY | Freq: Once | CUTANEOUS | Status: AC
  Administered 2021-11-17: 30 via TOPICAL
  Filled 2021-11-17: qty 116

## 2021-11-17 NOTE — Patient Instructions (Signed)
____________________________________________________________________________________________  Virtual Visits   What is a "Virtual Visit"? It is a Metallurgist (medical visit) that takes place on real time (NOT TEXT or E-MAIL) over the telephone or computer device (desktop, laptop, tablet, smart phone, etc.). It allows for more location flexibility between the patient and the healthcare provider.  Who decides when these types of visits will be used? The physician.  Who is eligible for these types of visits? Only those patients that can be reliably reached over the telephone.  What do you mean by reliably? We do not have time to call everyone multiple times, therefore those that tend to screen calls and then call back later are not suitable candidates for this system. We understand how people are reluctant to pickup on "unknown" calls, therefore, we suggest adding our telephone numbers to your list of "CONTACT(s)". This way, you should be able to readily identify our calls when you receive one. All of our numbers are available below.   Who is not eligible? This option is not available for medication management encounters, specially for controlled substances. Patients on pain medications that fall under the category of controlled substances have to come in for "Face-to-Face" encounters. This is required for mandatory monitoring of these substances. You may be asked to provide a sample for an unannounced urine drug screening test (UDS), and we will need to count your pain pills. Not bringing your pills to be counted may result in no refill. Obviously, neither one of these can be done over the phone.  When will this type of visits be used? You can request a virtual visit whenever you are physically unable to attend a regular appointment. The decision will be made by the physician (or healthcare provider) on a case by case basis.   At what time will I be called? This is an  excellent question. The providers will try to call you whenever they have time available. Do not expect to be called at any specific time. The secretaries will assign you a time for your virtual visit appointment, but this is done simply to keep a list of those patients that need to be called, but not for the purpose of keeping a time schedule. Be advised that the call may come in anytime during the day, between the hours of 8:00 AM and 8::00 PM, depending on provider availability. We do understand that the system is not perfect. If you are unable to be available that day on a moments notice, then request an "in-person" appointment rather than a "virtual visit".  Can I request my medication visits to be "Virtual"? Yes you may request it, but the decision is entirely up to the healthcare provider. Control substances require specific monitoring that requires Face-to-Face encounters. The number of encounters  and the extent of the monitoring is determined on a case by case basis.  Add a new contact to your smart phone and label it "PAIN CLINIC" Under this contact add the following numbers: Main: [phone] (Official Contact Number) Nurses: [phone] (These are outgoing only calling systems. Do not call this number.) Dr. [name]: [phone] or [phone] (Outgoing calls only. Do not call this number.)  ____________________________________________________________________________________________  ____________________________________________________________________________________________  Post-Procedure Discharge Instructions  Instructions: Apply ice:  Purpose: This will minimize any swelling and discomfort after procedure.  When: Day of procedure, as soon as you get home. How: Fill a plastic sandwich bag with crushed ice. Cover it with a small towel and apply to injection  site. How long: (15 min on, 15 min off) Apply for 15 minutes then remove x 15 minutes.  Repeat sequence on day of  procedure, until you go to bed. Apply heat:  Purpose: To treat any soreness and discomfort from the procedure. When: Starting the next day after the procedure. How: Apply heat to procedure site starting the day following the procedure. How long: May continue to repeat daily, until discomfort goes away. Food intake: Start with clear liquids (like water) and advance to regular food, as tolerated.  Physical activities: Keep activities to a minimum for the first 8 hours after the procedure. After that, then as tolerated. Driving: If you have received any sedation, be responsible and do not drive. You are not allowed to drive for 24 hours after having sedation. Blood thinner: (Applies only to those taking blood thinners) You may restart your blood thinner 6 hours after your procedure. Insulin: (Applies only to Diabetic patients taking insulin) As soon as you can eat, you may resume your normal dosing schedule. Infection prevention: Keep procedure site clean and dry. Shower daily and clean area with soap and water. Post-procedure Pain Diary: Extremely important that this be done correctly and accurately. Recorded information will be used to determine the next step in treatment. For the purpose of accuracy, follow these rules: Evaluate only the area treated. Do not report or include pain from an untreated area. For the purpose of this evaluation, ignore all other areas of pain, except for the treated area. After your procedure, avoid taking a long nap and attempting to complete the pain diary after you wake up. Instead, set your alarm clock to go off every hour, on the hour, for the initial 8 hours after the procedure. Document the duration of the numbing medicine, and the relief you are getting from it. Do not go to sleep and attempt to complete it later. It will not be accurate. If you received sedation, it is likely that you were given a medication that may cause amnesia. Because of this, completing the  diary at a later time may cause the information to be inaccurate. This information is needed to plan your care. Follow-up appointment: Keep your post-procedure follow-up evaluation appointment after the procedure (usually 2 weeks for most procedures, 6 weeks for radiofrequencies). DO NOT FORGET to bring you pain diary with you.   Expect: (What should I expect to see with my procedure?) From numbing medicine (AKA: Local Anesthetics): Numbness or decrease in pain. You may also experience some weakness, which if present, could last for the duration of the local anesthetic. Onset: Full effect within 15 minutes of injected. Duration: It will depend on the type of local anesthetic used. On the average, 1 to 8 hours.  From steroids (Applies only if steroids were used): Decrease in swelling or inflammation. Once inflammation is improved, relief of the pain will follow. Onset of benefits: Depends on the amount of swelling present. The more swelling, the longer it will take for the benefits to be seen. In some cases, up to 10 days. Duration: Steroids will stay in the system x 2 weeks. Duration of benefits will depend on multiple posibilities including persistent irritating factors. Side-effects: If present, they may typically last 2 weeks (the duration of the steroids). Frequent: Cramps (if they occur, drink Gatorade and take over-the-counter Magnesium 450-500 mg once to twice a day); water retention with temporary weight gain; increases in blood sugar; decreased immune system response; increased appetite. Occasional: Facial flushing (red, warm  cheeks); mood swings; menstrual changes. Uncommon: Long-term decrease or suppression of natural hormones; bone thinning. (These are more common with higher doses or more frequent use. This is why we prefer that our patients avoid having any injection therapies in other practices.)  Very Rare: Severe mood changes; psychosis; aseptic necrosis. From procedure: Some  discomfort is to be expected once the numbing medicine wears off. This should be minimal if ice and heat are applied as instructed.  Call if: (When should I call?) You experience numbness and weakness that gets worse with time, as opposed to wearing off. New onset bowel or bladder incontinence. (Applies only to procedures done in the spine)  Emergency Numbers: Durning business hours (Monday - Thursday, 8:00 AM - 4:00 PM) (Friday, 9:00 AM - 12:00 Noon): [phone] After hours: [phone] NOTE: If you are having a problem and are unable connect with, or to talk to a provider, then go to your nearest urgent care or emergency department. If the problem is serious and urgent, please call 911. ____________________________________________________________________________________________   ______________________________________________________________________________________________  Body mass index (BMI)  Body mass index (BMI) is a common tool for deciding whether a person has an appropriate body weight.  It measures a persons weight in relation to their height.   According to the [facility] of health (NIH): A BMI of less than 18.5 means that a person is underweight. A BMI of between 18.5 and 24.9 is ideal. A BMI of between 25 and 29.9 is overweight. A BMI over 30 indicates obesity.  Weight Management Required  URGENT: Your weight has been found to be adversely affecting your health.  Dear Ms. Aceituno:  Your current Estimated body mass index is 64.13 kg/m as calculated from the following:   Height as of this encounter: 5' 3" (1.6 m).   Weight as of this encounter: 362 lb (164.2 kg).  Please use the table below to identify your weight category and associated incidence of chronic pain, secondary to your weight.  Body Mass Index (BMI) Classification BMI level (kg/m2) Category Associated incidence of chronic pain  <18  Underweight   18.5-24.9 Ideal body weight   25-29.9  Overweight  20%  30-34.9 Obese (Class I)  68%  35-39.9 Severe obesity (Class II)  136%  >40 Extreme obesity (Class III)  254%   In addition: You will be considered "Morbidly Obese", if your BMI is above 30 and you have one or more of the following conditions which are known to be caused and/or directly associated with obesity: 1.    Type 2 Diabetes (Which in turn can lead to cardiovascular diseases (CVD), stroke, peripheral vascular diseases (PVD), retinopathy, nephropathy, and neuropathy) 2.    Cardiovascular Disease (High Blood Pressure; Congestive Heart Failure; High Cholesterol; Coronary Artery Disease; Angina; or History of Heart Attacks) 3.    Breathing problems (Asthma; obesity-hypoventilation syndrome; obstructive sleep apnea; chronic inflammatory airway disease; reactive airway disease; or shortness of breath) 4.    Chronic kidney disease 5.    Liver disease (nonalcoholic fatty liver disease) 6.    High blood pressure 7.    Acid reflux (gastroesophageal reflux disease; heartburn) 8.    Osteoarthritis (OA) (with any of the following: hip pain; knee pain; and/or low back pain) 9.    Low back pain (Lumbar Facet Syndrome; and/or Degenerative Disc Disease) 10.  Hip pain (Osteoarthritis of hip) (For every 1 lbs of added body weight, there is a 2 lbs increase in pressure inside of each hip articulation.  1:2 mechanical relationship) 11.  Knee pain (Osteoarthritis of knee) (For every 1 lbs of added body weight, there is a 4 lbs increase in pressure inside of each knee articulation. 1:4 mechanical relationship) (patients with a BMI>30 kg/m2 were 6.8 times more likely to develop knee OA than normal-weight individuals) 12.  Cancer: Epidemiological studies have shown that obesity is a risk factor for: post-menopausal breast cancer; cancers of the endometrium, colon and kidney cancer; malignant adenomas of the oesophagus. Obese subjects have an approximately 1.5-3.5-fold increased risk of developing  these cancers compared with normal-weight subjects, and it has been estimated that between 15 and 45% of these cancers can be attributed to overweight. More recent studies suggest that obesity may also increase the risk of other types of cancer, including pancreatic, hepatic and gallbladder cancer. (Ref: Obesity and cancer. Pischon T, Nthlings U, [employer] H. Proc Nutr Soc. 2008 May;67(2):128-45. doi: 16.1096/E4540981191478295.) The International Agency for Research on Cancer (IARC) has identified 13 cancers associated with overweight and obesity: meningioma, multiple myeloma, adenocarcinoma of the esophagus, and cancers of the thyroid, postmenopausal breast cancer, gallbladder, stomach, liver, pancreas, kidney, ovaries, uterus, colon and rectal (colorectal) cancers. 29 percent of all cancers diagnosed in women and 24 percent of those diagnosed in men are associated with overweight and obesity.  Recommendation: At this point it is urgent that you take a step back and concentrate in loosing weight. Dedicate 100% of your efforts on this task. Nothing else will improve your health more than bringing your weight down and your BMI to less than 30. If you are here, you probably have chronic pain. We know that most chronic pain patients have difficulty exercising secondary to their pain. For this reason, you must rely on proper nutrition and diet in order to lose the weight. If your BMI is above 40, you should seriously consider bariatric surgery. A realistic goal is to lose 10% of your body weight over a period of 12 months.  Be honest to yourself, if over time you have unsuccessfully tried to lose weight, then it is time for you to seek professional help and to enter a medically supervised weight management program, and/or undergo bariatric surgery. Stop procrastinating.   Pain management considerations:  1.    Pharmacological Problems: Be advised that the use of opioid analgesics (oxycodone; hydrocodone; morphine;  methadone; codeine; and all of their derivatives) have been associated with decreased metabolism and weight gain.  For this reason, should we see that you are unable to lose weight while taking these medications, it may become necessary for [place] to taper down and indefinitely discontinue them.  2.    Technical Problems: The incidence of successful interventional therapies decreases as the patient's BMI increases. It is much more difficult to accomplish a safe and effective interventional therapy on a patient with a BMI above 35. 3.    Radiation Exposure Problems: The x-rays machine, used to accomplish injection therapies, will automatically increase their x-ray output in order to capture an appropriate bone image. This means that radiation exposure increases exponentially with the patient's BMI. (The higher the BMI, the higher the radiation exposure.) Although the level of radiation used at a given time is still safe to the patient, it is not for the physician and/or assisting staff. Unfortunately, radiation exposure is accumulative. Because physicians and the staff have to do procedures and be exposed on a daily basis, this can result in health problems such as cancer and radiation burns. Radiation exposure to the staff is  monitored by the radiation batches that they wear. The exposure levels are reported back to the staff on a quarterly basis. Depending on levels of exposure, physicians and staff may be obligated by law to decrease this exposure. This means that they have the right and obligation to refuse providing therapies where they may be overexposed to radiation. For this reason, physicians may decline to offer therapies such as radiofrequency ablation or implants to patients with a BMI above 40. 4.    Current Trends: Be advised that the current trend is to no longer offer certain therapies to patients with a BMI equal to, or above 35, due to increase perioperative risks, increased technical procedural  difficulties, and excessive radiation exposure to healthcare personnel.  ______________________________________________________________________________________________

## 2021-11-17 NOTE — Progress Notes (Signed)
PROVIDER NOTE: Interpretation of information contained herein should be left to medically-trained personnel. Specific patient instructions are provided elsewhere under "Patient Instructions" section of medical record. This document was created in part using STT-dictation technology, any transcriptional errors that may result from this process are unintentional.  Patient: Karen Lewis Type: Established DOB: 1971/03/11 MRN: [id number] PCP: Karen Crouch, MD  Service: Procedure DOS: 11/17/2021 Setting: Ambulatory Location: Ambulatory outpatient facility Delivery: Face-to-face Provider: Gaspar Cola, MD Specialty: Interventional Pain Management Specialty designation: 09 Location: Outpatient facility Ref. Prov.: Karen Pointer, MD    Primary Reason for Visit: Interventional Pain Management Treatment. CC: Back Pain (lower)   Procedure:           Type: Lumbar Facet, Medial Branch Block(s)  #8  (since 2020) Laterality: Bilateral  Level: L2, L3, L4, L5, & S1 Medial Branch Level(s). Injecting these levels blocks the L3-4 and L5-S1 lumbar facet joints. Imaging: Fluoroscopic guidance Anesthesia: Local anesthesia (1-2% Lidocaine) Anxiolysis: IV Versed 2.0 mg Sedation: None. DOS: 11/17/2021 Performed by: Karen Cola, MD  Primary Purpose: Diagnostic/Therapeutic Indications: Low back pain severe enough to impact quality of life or function. 1. Lumbar facet syndrome (Bilateral) (R>L)   2. Spondylosis without myelopathy or radiculopathy, lumbosacral region   3. Osteoarthritis of lumbar spine without myelopathy or radiculopathy   4. Lumbar spondylosis   5. Grade 1 (1.4 cm) Anterolisthesis of L4 over L5   6. DDD (degenerative disc disease), lumbosacral   7. Chronic low back pain (3ry area of Pain) (Bilateral) (R>L) w/o sciatica   8. Class 3 severe obesity due to excess calories with serious comorbidity and body mass index (BMI) of 60.0 to 69.9 in adult [facility])    NAS-11 Pain  score:   Pre-procedure: 4 /10   Post-procedure: 2 /10     Today I provided the patient with a printed copy of her lumbar MRI.  We spent quite a bit of time going over those results and explaining them to the patient in layman's terms.  The patient was very concerned about a bone lesion seen at the L1 spinous process.  This study indicates that it could related with a finding seen on the CT of the abdomen/pelvis done on 11/09/2018.  I pulled out the images from both studies and I showed it to the patient since she was a little confused about the this lesion.  She seemed to have understood that this was a "tumor in her abdomen".  This misconception was corrected when I showed her the images.  She requested that the results of the MRI be sent to her PCP for further follow-up and evaluation.  I also explained to the patient that the results of her MRI show significant acute problems with her facet joints and interspinous ligaments which I attribute to her weight and the damage that it is causing one her spine as well as hips and knees.  She has already had some joint replacements and she was warned that when he comes to the spine this will be a lot harder to treat.  Furthermore, I also explained to the patient the findings on her lumbar spine x-rays that show a 6 mm anterolisthesis of L3 over L4 as well as an 11 mm anterolisthesis of L4 over L5.  The MRI indicated that these 2 areas of spondylolisthesis had been significantly reduced with the MRI, suggesting some dynamic instability at both levels.  The patient was also informed of this and how it can be further  aggravated by the persistent weight.  She understood and accepted.   Position / Prep / Materials:  Position: Prone  Prep solution: DuraPrep (Iodine Povacrylex [0.7% available iodine] and Isopropyl Alcohol, 74% w/w) Area Prepped: Posterolateral Lumbosacral Spine (Wide prep: From the lower border of the scapula down to the end of the tailbone and from  flank to flank.)  Materials:  Tray: Block Needle(s):  Type: Spinal  Gauge (G): 22  Length: 7-in Qty: 4  Pre-op H&P Assessment:  Karen Lewis is a 51 y.o. (year old), female patient, seen today for interventional treatment. She  has a past surgical history that includes Laparoscopic partial gastrectomy; Shoulder arthroscopy (Right); Carpal tunnel release (Bilateral); Diagnostic laparoscopy; Cholecystectomy; Trigger finger release (Right); Thyroidectomy (N/A, 11/12/2015); left trigger finger; Roux-en-Y Gastric Bypass (06/03/2017); Hiatal hernia repair; peniculectomy (N/A, 07/05/2018); Total hip arthroplasty (Right, 11/27/2019); Joint replacement (Bilateral, hip); Appendectomy; Trigger finger release (Right, 04/24/2020); Colonoscopy with propofol (N/A, 02/11/2021); and Esophagogastroduodenoscopy (egd) with propofol (N/A, 02/11/2021). Karen Lewis has a current medication list which includes the following prescription(s): accu-chek aviva plus, acetaminophen, acidophilus, albuterol, amitriptyline, vitamin c, biotin, butalbital-acetaminophen-caffeine, calcium carbonate, clonazepam, b-12, diclofenac sodium, diphenhydramine, dupixent, epinephrine, ergocalciferol, famotidine, fluticasone furoate-vilanterol, furosemide, accu-chek aviva plus, levocetirizine, magnesium oxide, meclizine, metformin, metoprolol tartrate, montelukast, tab-a-vite, naphazoline-pheniramine, naproxen sodium, nitrofurantoin (macrocrystal-monohydrate), omeprazole, oxycodone, [START ON 11/25/2021] oxycodone, [START ON 12/25/2021] oxycodone, ozempic (0.25 or 0.5 mg/dose), quetiapine, rizatriptan, rizatriptan, rosuvastatin, sertraline, spironolactone, tizanidine, topiramate, zolpidem, benzonatate, guaifenesin-codeine, levothyroxine, lisinopril, naloxone, and pregabalin. Her primarily concern today is the Back Pain (lower)  Initial Vital Signs:  Pulse/HCG Rate: 83ECG Heart Rate: 88 Temp: (!) 97.1 F (36.2 C) Resp: 18 BP: (!) 95/56 SpO2: 100 %  BMI:  Estimated body mass index is 64.13 kg/m as calculated from the following:   Height as of this encounter: '5\' 3"'$  (1.6 m).   Weight as of this encounter: 362 lb (164.2 kg).  Risk Assessment: Allergies: Reviewed. She is allergic to bactrim [sulfamethoxazole-trimethoprim], omalizumab, ciprofloxacin, shellfish allergy, and nsaids.  Allergy Precautions: None required Coagulopathies: Reviewed. None identified.  Blood-thinner therapy: None at this time Active Infection(s): Reviewed. None identified. Ms. Grieshop is afebrile  Site Confirmation: Ms. Grandt was asked to confirm the procedure and laterality before marking the site Procedure checklist: Completed Consent: Before the procedure and under the influence of no sedative(s), amnesic(s), or anxiolytics, the patient was informed of the treatment options, risks and possible complications. To fulfill our ethical and legal obligations, as recommended by the American Medical Association's Code of Ethics, I have informed the patient of my clinical impression; the nature and purpose of the treatment or procedure; the risks, benefits, and possible complications of the intervention; the alternatives, including doing nothing; the risk(s) and benefit(s) of the alternative treatment(s) or procedure(s); and the risk(s) and benefit(s) of doing nothing. The patient was provided information about the general risks and possible complications associated with the procedure. These may include, but are not limited to: failure to achieve desired goals, infection, bleeding, organ or nerve damage, allergic reactions, paralysis, and death. In addition, the patient was informed of those risks and complications associated to Spine-related procedures, such as failure to decrease pain; infection (i.e.: Meningitis, epidural or intraspinal abscess); bleeding (i.e.: epidural hematoma, subarachnoid hemorrhage, or any other type of intraspinal or peri-dural bleeding); organ or nerve damage  (i.e.: Any type of peripheral nerve, nerve root, or spinal cord injury) with subsequent damage to sensory, motor, and/or autonomic systems, resulting in permanent pain, numbness, and/or weakness of one or several areas  of the body; allergic reactions; (i.e.: anaphylactic reaction); and/or death. Furthermore, the patient was informed of those risks and complications associated with the medications. These include, but are not limited to: allergic reactions (i.e.: anaphylactic or anaphylactoid reaction(s)); adrenal axis suppression; blood sugar elevation that in diabetics may result in ketoacidosis or comma; water retention that in patients with history of congestive heart failure may result in shortness of breath, pulmonary edema, and decompensation with resultant heart failure; weight gain; swelling or edema; medication-induced neural toxicity; particulate matter embolism and blood vessel occlusion with resultant organ, and/or nervous system infarction; and/or aseptic necrosis of one or more joints. Finally, the patient was informed that Medicine is not an exact science; therefore, there is also the possibility of unforeseen or unpredictable risks and/or possible complications that may result in a catastrophic outcome. The patient indicated having understood very clearly. We have given the patient no guarantees and we have made no promises. Enough time was given to the patient to ask questions, all of which were answered to the patient's satisfaction. Ms. Chopra has indicated that she wanted to continue with the procedure. Attestation: I, the ordering provider, attest that I have discussed with the patient the benefits, risks, side-effects, alternatives, likelihood of achieving goals, and potential problems during recovery for the procedure that I have provided informed consent. Date   Time: 11/17/2021 10:28 AM  Pre-Procedure Preparation:  Monitoring: As per clinic protocol. Respiration, ETCO2, SpO2, BP, heart  rate and rhythm monitor placed and checked for adequate function Safety Precautions: Patient was assessed for positional comfort and pressure points before starting the procedure. Time-out: I initiated and conducted the "Time-out" before starting the procedure, as per protocol. The patient was asked to participate by confirming the accuracy of the "Time Out" information. Verification of the correct person, site, and procedure were performed and confirmed by me, the nursing staff, and the patient. "Time-out" conducted as per Joint Commission's Universal Protocol (UP.01.01.01). Time: 1138  Description of Procedure:          Laterality: Bilateral. The procedure was performed in identical fashion on both sides. Targeted Levels:  L2, L3, L4, L5, & S1 Medial Branch Level(s)  Safety Precautions: Aspiration looking for blood return was conducted prior to all injections. At no point did we inject any substances, as a needle was being advanced. Before injecting, the patient was told to immediately notify me if she was experiencing any new onset of "ringing in the ears, or metallic taste in the mouth". No attempts were made at seeking any paresthesias. Safe injection practices and needle disposal techniques used. Medications properly checked for expiration dates. SDV (single dose vial) medications used. After the completion of the procedure, all disposable equipment used was discarded in the proper designated medical waste containers. Local Anesthesia: Protocol guidelines were followed. The patient was positioned over the fluoroscopy table. The area was prepped in the usual manner. The time-out was completed. The target area was identified using fluoroscopy. A 12-in long, straight, sterile hemostat was used with fluoroscopic guidance to locate the targets for each level blocked. Once located, the skin was marked with an approved surgical skin marker. Once all sites were marked, the skin (epidermis, dermis, and  hypodermis), as well as deeper tissues (fat, connective tissue and muscle) were infiltrated with a small amount of a short-acting local anesthetic, loaded on a 10cc syringe with a 25G, 1.5-in  Needle. An appropriate amount of time was allowed for local anesthetics to take effect before proceeding to the  next step. Local Anesthetic: Lidocaine 2.0% The unused portion of the local anesthetic was discarded in the proper designated containers. Technical description of process:  L2 Medial Branch Nerve Block (MBB): The target area for the L2 medial branch is at the junction of the postero-lateral aspect of the superior articular process and the superior, posterior, and medial edge of the transverse process of L3. Under fluoroscopic guidance, a Quincke needle was inserted until contact was made with os over the superior postero-lateral aspect of the pedicular shadow (target area). After negative aspiration for blood, 0.5 mL of the nerve block solution was injected without difficulty or complication. The needle was removed intact. L3 Medial Branch Nerve Block (MBB): The target area for the L3 medial branch is at the junction of the postero-lateral aspect of the superior articular process and the superior, posterior, and medial edge of the transverse process of L4. Under fluoroscopic guidance, a Quincke needle was inserted until contact was made with os over the superior postero-lateral aspect of the pedicular shadow (target area). After negative aspiration for blood, 0.5 mL of the nerve block solution was injected without difficulty or complication. The needle was removed intact. L4 Medial Branch Nerve Block (MBB): The target area for the L4 medial branch is at the junction of the postero-lateral aspect of the superior articular process and the superior, posterior, and medial edge of the transverse process of L5. Under fluoroscopic guidance, a Quincke needle was inserted until contact was made with os over the superior  postero-lateral aspect of the pedicular shadow (target area). After negative aspiration for blood, 0.5 mL of the nerve block solution was injected without difficulty or complication. The needle was removed intact. L5 Medial Branch Nerve Block (MBB): The target area for the L5 medial branch is at the junction of the postero-lateral aspect of the superior articular process and the superior, posterior, and medial edge of the sacral ala. Under fluoroscopic guidance, a Quincke needle was inserted until contact was made with os over the superior postero-lateral aspect of the pedicular shadow (target area). After negative aspiration for blood, 0.5 mL of the nerve block solution was injected without difficulty or complication. The needle was removed intact. S1 Medial Branch Nerve Block (MBB): The target area for the S1 medial branch is at the posterior and inferior 6 o'clock position of the L5-S1 facet joint. Under fluoroscopic guidance, the Quincke needle inserted for the L5 MBB was redirected until contact was made with os over the inferior and postero aspect of the sacrum, at the 6 o' clock position under the L5-S1 facet joint (Target area). After negative aspiration for blood, 0.5 mL of the nerve block solution was injected without difficulty or complication. The needle was removed intact.  Once the entire procedure was completed, the treated area was cleaned, making sure to leave some of the prepping solution back to take advantage of its long term bactericidal properties.      Illustration of the posterior view of the lumbar spine and the posterior neural structures. Laminae of L2 through S1 are labeled. DPRL5, dorsal primary ramus of L5; DPRS1, dorsal primary ramus of S1; DPR3, dorsal primary ramus of L3; FJ, facet (zygapophyseal) joint L3-L4; I, inferior articular process of L4; LB1, lateral branch of dorsal primary ramus of L1; IAB, inferior articular branches from L3 medial branch (supplies L4-L5 facet  joint); IBP, intermediate branch plexus; MB3, medial branch of dorsal primary ramus of L3; NR3, third lumbar nerve root; S, superior articular process of L5;  SAB, superior articular branches from L4 (supplies L4-5 facet joint also); TP3, transverse process of L3.  Vitals:   11/17/21 1139 11/17/21 1144 11/17/21 1146 11/17/21 1154  BP: 138/77 123/74 123/81 121/70  Pulse:      Resp: '18 16 18 16  '$ Temp:      TempSrc:      SpO2: 100% 97% 97% 98%  Weight:      Height:         Start Time: 1138 hrs. End Time: 1146 hrs.  Imaging Guidance (Spinal):          Type of Imaging Technique: Fluoroscopy Guidance (Spinal) Indication(s): Assistance in needle guidance and placement for procedures requiring needle placement in or near specific anatomical locations not easily accessible without such assistance. Exposure Time: Please see nurses notes. Contrast: None used. Fluoroscopic Guidance: I was personally present during the use of fluoroscopy. "Tunnel Vision Technique" used to obtain the best possible view of the target area. Parallax error corrected before commencing the procedure. "Direction-depth-direction" technique used to introduce the needle under continuous pulsed fluoroscopy. Once target was reached, antero-posterior, oblique, and lateral fluoroscopic projection used confirm needle placement in all planes. Images permanently stored in EMR. Interpretation: No contrast injected. I personally interpreted the imaging intraoperatively. Adequate needle placement confirmed in multiple planes. Permanent images saved into the patient's record.  Antibiotic Prophylaxis:   Anti-infectives (From admission, onward)    None      Indication(s): None identified  Post-operative Assessment:  Post-procedure Vital Signs:  Pulse/HCG Rate: 8384 (nsr) Temp: (!) 97.1 F (36.2 C) Resp: 16 BP: 121/70 SpO2: 98 %  EBL: None  Complications: No immediate post-treatment complications observed by team, or  reported by patient.  Note: The patient tolerated the entire procedure well. A repeat set of vitals were taken after the procedure and the patient was kept under observation following institutional policy, for this type of procedure. Post-procedural neurological assessment was performed, showing return to baseline, prior to discharge. The patient was provided with post-procedure discharge instructions, including a section on how to identify potential problems. Should any problems arise concerning this procedure, the patient was given instructions to immediately contact us, at any time, without hesitation. In any case, we plan to contact the patient by telephone for a follow-up status report regarding this interventional procedure.  Comments:  No additional relevant information.  Plan of Care  Orders:  Orders Placed This Encounter  Procedures   LUMBAR FACET(MEDIAL BRANCH NERVE BLOCK) MBNB    Scheduling Instructions:     Procedure: Lumbar facet block (AKA.: Lumbosacral medial branch nerve block)     Side: Bilateral     Level: L3-4 & L5-S1 Facets (L2, L3, L4, L5, & S1 Medial Branch Nerves)     Sedation: Patient's choice.     Timeframe: Today    Order Specific Question:   Where will this procedure be performed?    Answer:   [facility]   DG PAIN [facility] C-ARM 1-60 MIN NO REPORT    Intraoperative interpretation by procedural physician at [facility].    Standing Status:   Standing    Number of Occurrences:   1    Order Specific Question:   Reason for exam:    Answer:   Assistance in needle guidance and placement for procedures requiring needle placement in or near specific anatomical locations not easily accessible without such assistance.   Informed Consent Details: Physician/Practitioner Attestation; Transcribe to consent form and obtain patient signature  Nursing Order: Transcribe to consent form and obtain patient signature. Note: Always confirm laterality of pain with  Ms. Brigitte Pulse, before procedure.    Order Specific Question:   Physician/Practitioner attestation of informed consent for procedure/surgical case    Answer:   I, the physician/practitioner, attest that I have discussed with the patient the benefits, risks, side effects, alternatives, likelihood of achieving goals and potential problems during recovery for the procedure that I have provided informed consent.    Order Specific Question:   Procedure    Answer:   Lumbar Facet Block  under fluoroscopic guidance    Order Specific Question:   Physician/Practitioner performing the procedure    Answer:   Zannie Locastro A. [name] MD    Order Specific Question:   Indication/Reason    Answer:   Low Back Pain, with our without leg pain, due to Facet Joint Arthralgia (Joint Pain) Spondylosis (Arthritis of the Spine), without myelopathy or radiculopathy (Nerve Damage).   Provide equipment / supplies at bedside    "Block Tray" (Disposable   single use) Needle type: SpinalSpinal Amount/quantity: 4 Size: Long (7-inch) Gauge: 22G    Standing Status:   Standing    Number of Occurrences:   1    Order Specific Question:   Specify    Answer:   Block Tray   Chronic Opioid Analgesic:  Oxycodone IR 5 mg 1 tab PO 5X/day (#150/mo) (25 mg/day) MME/day: 37.5 mg/day.   Medications ordered for procedure: Meds ordered this encounter  Medications   lidocaine (XYLOCAINE) 2 % (with pres) injection 400 mg   pentafluoroprop-tetrafluoroeth (GEBAUERS) aerosol   lactated ringers infusion 1,000 mL   midazolam (VERSED) 5 MG/5ML injection 0.5-2 mg    Make sure Flumazenil is available in the pyxis when using this medication. If oversedation occurs, administer 0.2 mg IV over 15 sec. If after 45 sec no response, administer 0.2 mg again over 1 min; may repeat at 1 min intervals; not to exceed 4 doses (1 mg)   ropivacaine (PF) 2 mg/mL (0.2%) (NAROPIN) injection 18 mL   triamcinolone acetonide (KENALOG-40) injection 80 mg   Medications  administered: We administered lidocaine, pentafluoroprop-tetrafluoroeth, lactated ringers, midazolam, ropivacaine (PF) 2 mg/mL (0.2%), and triamcinolone acetonide.  See the medical record for exact dosing, route, and time of administration.  Follow-up plan:   Return in about 2 weeks (around 12/01/2021) for Proc-day (T,Th), (VV), (PPE).       Interventional Therapies  Risk   Complexity Considerations:   Estimated body mass index is 60.59 kg/m as calculated from the following:   Height as of 06/18/21: '5\' 4"'$  (1.626 m).   Weight as of 06/18/21: 353 lb (160.1 kg). NOTE: NO RFA until BMI less or equal to 35.  (Gastric bypass done on 06/03/2017) Iodine allergy  Contrast dye allergy  Shellfish allergy    Planned   Pending:      Under consideration:   Diagnostic bilateral genicular NB    Completed:   Therapeutic bilateral IA Zilretta knee injection x2 (08/04/2021) (100/100/75/75)  Therapeutic bilateral IA Hyalgan knee injection x16 (06/24/2020)  Therapeutic bilateral THR (total hip replacements) (Dr. Rudene Christians) (Right: 11/27/2019) (Left: 07/29/2015)  Palliative/therapeutic bilateral lumbar facet MBB x7 (08/25/2021) (100/100/60/60)  Therapeutic right IA hip injection x3 (07/07/2017)  Therapeutic left IA shoulder (glenohumeral joint) injection x1 (05/31/2017)    Therapeutic   Palliative (PRN) options:   Palliative bilateral knee injections  Palliative bilateral lumbar facet MBBs       Recent Visits Date Type Provider Dept  10/26/21 Office Visit Karen Pointer, MD [facility]  09/08/21 Office Visit Karen Pointer, MD [facility]  08/25/21 Procedure visit Karen Pointer, MD [facility]  Showing recent visits within past 90 days and meeting all other requirements Today's Visits Date Type Provider Dept  11/17/21 Procedure visit Karen Pointer, MD [facility]  Showing today's visits and meeting all other requirements Future  Appointments Date Type Provider Dept  12/08/21 Appointment Karen Pointer, MD [facility]  Showing future appointments within next 90 days and meeting all other requirements  Disposition: Discharge home  Discharge (Date   Time): 11/17/2021; 1158 hrs.   Primary Care Physician: Karen Crouch, MD Location: [facility] Note by: Karen Cola, MD Date: 11/17/2021; Time: 12:10 PM  Disclaimer:  Medicine is not an Chief Strategy Officer. The only guarantee in medicine is that nothing is guaranteed. It is important to note that the decision to proceed with this intervention was based on the information collected from the patient. The Data and conclusions were drawn from the patient's questionnaire, the interview, and the physical examination. Because the information was provided in large part by the patient, it cannot be guaranteed that it has not been purposely or unconsciously manipulated. Every effort has been made to obtain as much relevant data as possible for this evaluation. It is important to note that the conclusions that lead to this procedure are derived in large part from the available data. Always take into account that the treatment will also be dependent on availability of resources and existing treatment guidelines, considered by other Pain Management Practitioners as being common knowledge and practice, at the time of the intervention. For Medico-Legal purposes, it is also important to point out that variation in procedural techniques and pharmacological choices are the acceptable norm. The indications, contraindications, technique, and results of the above procedure should only be interpreted and judged by a Board-Certified Interventional Pain Specialist with extensive familiarity and expertise in the same exact procedure and technique.

## 2021-11-18 ENCOUNTER — Telehealth: Payer: Self-pay | Admitting: *Deleted

## 2021-11-18 NOTE — Telephone Encounter (Signed)
No problems post procedure. 

## 2021-11-19 ENCOUNTER — Inpatient Hospital Stay: Payer: Medicare HMO | Attending: Oncology

## 2021-11-19 ENCOUNTER — Other Ambulatory Visit: Payer: Self-pay

## 2021-11-19 VITALS — BP 129/73 | HR 79 | Resp 20

## 2021-11-19 DIAGNOSIS — X32XXXA Exposure to sunlight, initial encounter: Secondary | ICD-10-CM | POA: Diagnosis not present

## 2021-11-19 DIAGNOSIS — L814 Other melanin hyperpigmentation: Secondary | ICD-10-CM | POA: Diagnosis not present

## 2021-11-19 DIAGNOSIS — D509 Iron deficiency anemia, unspecified: Secondary | ICD-10-CM

## 2021-11-19 DIAGNOSIS — D508 Other iron deficiency anemias: Secondary | ICD-10-CM | POA: Diagnosis not present

## 2021-11-19 DIAGNOSIS — R399 Unspecified symptoms and signs involving the genitourinary system: Secondary | ICD-10-CM | POA: Diagnosis not present

## 2021-11-19 DIAGNOSIS — L821 Other seborrheic keratosis: Secondary | ICD-10-CM | POA: Diagnosis not present

## 2021-11-19 MED ORDER — SODIUM CHLORIDE 0.9 % IV SOLN: 200.0000 mg | Freq: Once | INTRAVENOUS | Status: DC

## 2021-11-19 MED ORDER — SODIUM CHLORIDE 0.9 % IV SOLN
INTRAVENOUS | Status: DC
  Filled 2021-11-19: qty 250

## 2021-11-19 MED ORDER — IRON SUCROSE 20 MG/ML IV SOLN
200.0000 mg | Freq: Once | INTRAVENOUS | Status: AC
  Administered 2021-11-19: 15:00:00 200 mg via INTRAVENOUS
  Filled 2021-11-19: qty 10

## 2021-12-01 DIAGNOSIS — F419 Anxiety disorder, unspecified: Secondary | ICD-10-CM | POA: Diagnosis not present

## 2021-12-01 DIAGNOSIS — F4312 Post-traumatic stress disorder, chronic: Secondary | ICD-10-CM | POA: Diagnosis not present

## 2021-12-01 DIAGNOSIS — F331 Major depressive disorder, recurrent, moderate: Secondary | ICD-10-CM | POA: Diagnosis not present

## 2021-12-02 ENCOUNTER — Ambulatory Visit
Admission: RE | Admit: 2021-12-02 | Discharge: 2021-12-02 | Disposition: A | Discharge status: Home / Self Care | Payer: Medicare HMO | Source: Ambulatory Visit | Attending: Internal Medicine | Admitting: Internal Medicine

## 2021-12-02 ENCOUNTER — Other Ambulatory Visit: Payer: Self-pay

## 2021-12-02 DIAGNOSIS — Z1231 Encounter for screening mammogram for malignant neoplasm of breast: Secondary | ICD-10-CM | POA: Insufficient documentation

## 2021-12-07 ENCOUNTER — Ambulatory Visit: Payer: Self-pay | Admitting: Urology

## 2021-12-07 DIAGNOSIS — M797 Fibromyalgia: Secondary | ICD-10-CM | POA: Diagnosis not present

## 2021-12-07 DIAGNOSIS — G479 Sleep disorder, unspecified: Secondary | ICD-10-CM | POA: Insufficient documentation

## 2021-12-07 DIAGNOSIS — M545 Low back pain, unspecified: Secondary | ICD-10-CM | POA: Diagnosis not present

## 2021-12-07 DIAGNOSIS — R519 Headache, unspecified: Secondary | ICD-10-CM | POA: Diagnosis not present

## 2021-12-08 ENCOUNTER — Other Ambulatory Visit: Payer: Self-pay

## 2021-12-08 ENCOUNTER — Ambulatory Visit: Payer: Medicare HMO | Attending: Pain Medicine | Admitting: Pain Medicine

## 2021-12-08 DIAGNOSIS — M25561 Pain in right knee: Secondary | ICD-10-CM

## 2021-12-08 DIAGNOSIS — G8929 Other chronic pain: Secondary | ICD-10-CM

## 2021-12-08 DIAGNOSIS — M25562 Pain in left knee: Secondary | ICD-10-CM

## 2021-12-08 DIAGNOSIS — M545 Low back pain, unspecified: Secondary | ICD-10-CM | POA: Diagnosis not present

## 2021-12-08 DIAGNOSIS — M47816 Spondylosis without myelopathy or radiculopathy, lumbar region: Secondary | ICD-10-CM | POA: Diagnosis not present

## 2021-12-08 DIAGNOSIS — G894 Chronic pain syndrome: Secondary | ICD-10-CM

## 2021-12-08 DIAGNOSIS — Z6841 Body Mass Index (BMI) 40.0 and over, adult: Secondary | ICD-10-CM | POA: Diagnosis not present

## 2021-12-08 MED ORDER — LIDOCAINE 5 % EX PTCH: 1.0000 | MEDICATED_PATCH | Freq: Two times a day (BID) | CUTANEOUS | 2 refills | Status: AC

## 2021-12-08 NOTE — Progress Notes (Signed)
Patient: Karen Lewis  Service Category: E/M  Provider: Gaspar Cola, MD  ?DOB: 20-Aug-1971  DOS: 12/08/2021  Location: Office  ?MRN: [id number]  Setting: Ambulatory outpatient  Referring Provider: Idelle Crouch, MD  ?Type: Established Patient  Specialty: Interventional Pain Management  PCP: Idelle Crouch, MD  ?Location: Remote location  Delivery: TeleHealth    ? ?Virtual Encounter - Pain Management ?PROVIDER NOTE: Information contained herein reflects review and annotations entered in association with encounter. Interpretation of such information and data should be left to medically-trained personnel. Information provided to patient can be located elsewhere in the medical record under "Patient Instructions". Document created using STT-dictation technology, any transcriptional errors that may result from process are unintentional.  ?  ?Contact & Pharmacy ?Preferred: [phone] ?Home: [phone] (home) ?Mobile: [phone] (mobile) ?E-mail: [email or web address]  ?[place] [place] [address] ?Phone: [phone] Fax: [phone] ? ?[id number] [address] ?[id number]?Phone: [phone]: [phone]?  ?Pre-screening  ?Ms. SRoundoffered "in-person" vs "virtual" encounter. She indicated preferring virtual for this encounter.  ? ?Reason ?COVID-19*  Social distancing based on CDC and AMA recommendations.  ? ?I contacted Karen Charonon 12/08/2021 via telephone.      I clearly identified myself as FGaspar Cola MD. I verified that I was speaking with the correct person using two identifiers (Name: Karen Lewis and date of birth: [birth year]. ? ?Consent ?I sought verbal advanced consent from Karen Lewis. I informed Karen Lewis possible security and privacy concerns, risks, and limitations associated with providing "not-in-person"  medical evaluation and management services. I also informed Karen Lewis the availability of "in-person" appointments. Finally, I informed her that there would be a charge for the virtual visit and that she could be  personally, fully or partially, financially responsible for it. Karen Lewis understanding and agreed to proceed.  ? ?Historic Elements   ?Ms. LDANNICA BICKHAMis a 51y.o. year old, female patient evaluated today after our last contact on 11/17/2021. Ms. SCihlar has a past medical history of Anemia, Anginal pain ([place], Anxiety, Arthralgia of hip (07/29/2015), Arthritis, Arthritis, degenerative (07/29/2015), Asthma, Cephalalgia (07/25/2014), Dependence on unknown drug ([place], Depression, Diabetes mellitus without complication ([place], Difficult intubation, Dysrhythmia, Eczema, Fibromyalgia, Gastritis, GERD (gastroesophageal reflux disease), Gonalgia (07/29/2015), Gout, H/O cardiovascular disorder (03/10/2015), H/O surgical procedure (12/05/2012), H/O thyroid disease (03/10/2015), Headache, Herpes, History of artificial joint (07/29/2015), History of hiatal hernia, Hypertension, Hypomagnesemia, Hypothyroidism, IDA (iron deficiency anemia) (05/28/2019), LBP (low back pain) (07/29/2015), Neuromuscular disorder ([place], Obesity, PCOS (polycystic ovarian syndrome), Primary osteoarthritis of both knees (07/29/2015), Sleep apnea, Thyroid nodule (bilateral), and Umbilical hernia. She also  has a past surgical history that includes Laparoscopic partial gastrectomy; Shoulder arthroscopy (Right); Carpal tunnel release (Bilateral); Diagnostic laparoscopy; Cholecystectomy; Trigger finger release (Right); Thyroidectomy (N/A, 11/12/2015); left trigger finger; Roux-en-Y Gastric Bypass (06/03/2017); Hiatal hernia repair; peniculectomy (N/A, 07/05/2018); Total hip arthroplasty (Right, 11/27/2019); Joint replacement (Bilateral, hip); Appendectomy; Trigger finger release (Right, 04/24/2020); Colonoscopy with propofol (N/A, 02/11/2021); and  Esophagogastroduodenoscopy (egd) with propofol (N/A, 02/11/2021). Ms. SOverstreethas a current medication list which includes the following prescription(s): accu-chek aviva plus, acetaminophen, acidophilus, albuterol, amitriptyline, vitamin c, biotin, butalbital-acetaminophen-caffeine, calcium carbonate, clonazepam, b-12, diclofenac sodium, diphenhydramine, dupixent, epinephrine, ergocalciferol, famotidine, fluticasone furoate-vilanterol, furosemide, emgality, accu-chek aviva plus, levocetirizine, levothyroxine, lidocaine, lisinopril, magnesium oxide, meclizine, metformin,  metoprolol tartrate, montelukast, tab-a-vite, naphazoline-pheniramine, naproxen sodium, nitrofurantoin (macrocrystal-monohydrate), omeprazole, oxycodone, [START ON 12/25/2021] oxycodone, ozempic (0.25 or 0.5 mg/dose), quetiapine, rizatriptan, rizatriptan, rosuvastatin, sertraline, spironolactone, tizanidine, topiramate, zolpidem, oxycodone, and pregabalin. She  reports that she has never smoked. She has never used smokeless tobacco. She reports that she does not drink alcohol and does not use drugs. Karen Lewis is allergic to bactrim [sulfamethoxazole-trimethoprim], omalizumab, ciprofloxacin, shellfish allergy, and nsaids.  ? ?HPI  ?Today, she is being contacted for a post-procedure assessment.  The patient indicates having attained 100% relief of the pain for the duration of the local anesthetic followed by a decrease to an ongoing 70% relief of her low back pain.  Today she indicates that her knee pain is beginning to come back.  Review of the chart revealed that her last treatment was on 08/04/2021, at which time we had done a bilateral Zilretta injection under fluoroscopic guidance. ? ?Post-procedure evaluation  ?  Type: Lumbar Facet, Medial Branch Block(s)  #8  (since 2020) ?Laterality: Bilateral  ?Level: L2, L3, L4, L5, & S1 Medial Branch Level(s). Injecting these levels blocks the L3-4 and L5-S1 lumbar facet joints. ?Imaging: Fluoroscopic  guidance ?Anesthesia: Local anesthesia (1-2% Lidocaine) ?Anxiolysis: IV Versed 2.0 mg ?Sedation: None. ?DOS: 11/17/2021 ?Performed by: Gaspar Cola, MD ? ?Primary Purpose: Diagnostic/Therapeutic ?Indications: Low back pain severe enough to impact quality of life or function. ?1. Lumbar facet syndrome (Bilateral) (R>L)   ?2. Spondylosis without myelopathy or radiculopathy, lumbosacral region   ?3. 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 Lumbar spondylosis   ?5. Grade 1 (1.4 cm) Anterolisthesis of L4 over L5   ?6. DDD (degenerative disc disease), lumbosacral   ?7. Chronic low back pain (3ry area of Pain) (Bilateral) (R>L) w/o sciatica   ?8. Class 3 severe obesity due to excess calories with serious comorbidity and body mass index (BMI) of 60.0 to 69.9 in adult [facility])   ? ?NAS-11 Pain score:  ? Pre-procedure: 4 /10  ? Post-procedure: 2 /10  ? ?  ?Effectiveness:  ?Initial hour after procedure: 100 %. ?Subsequent 4-6 hours post-procedure: 100 %. ?Analgesia past initial 6 hours: 75 % (good pain relief up until the past couple of days,  beginning to ache and is starting to have difficulty standing up straight.). ?Ongoing improvement:  ?Analgesic: The patient indicates currently enjoying an ongoing 75% relief of her low back pain. ?Function: Ms. Schamberger reports improvement in function ?ROM: Ms. Quintela reports improvement in ROM ? ?Pharmacotherapy Assessment  ? ?Opioid Analgesic: Oxycodone IR 5 mg 1 tab PO 5X/day (#150/mo) (25 mg/day) ?MME/day: 37.5 mg/day.  ? ?Monitoring: ?[place] PMP: PDMP reviewed during this encounter.       ?Pharmacotherapy: No side-effects or adverse reactions reported. ?Compliance: No problems identified. ?Effectiveness: Clinically acceptable. ?Plan: Refer to "POC". UDS:  ?Summary  ?Date Value Ref Range Status  ?10/26/2021 Note  Final  ?  Comment:  ?  ==================================================================== ?[employer] 13  (MW) ?==================================================================== ?Test                             Result       Flag       Units ? ?Drug Present and Declared for Prescription Verification ?  7-aminoclonazepam              48           EXPECTED

## 2021-12-08 NOTE — Patient Instructions (Signed)
______________________________________________________________________ ? ?Preparing for Procedure with Sedation ? ?NOTICE: Due to recent regulatory changes, starting on April 13, 2021, procedures requiring intravenous (IV) sedation will no longer be performed at the [facility].  These types of procedures are required to be performed at [facility] ambulatory surgery facility.  We are very sorry for the inconvenience. ? ?Procedure appointments are limited to planned procedures: ?No Prescription Refills. ?No disability issues will be discussed. ?No medication changes will be discussed. ? ?Instructions: ?Oral Intake: Do not eat or drink anything for at least 8 hours prior to your procedure. (Exception: Blood Pressure Medication. See below.) ?Transportation: A driver is required. You may not drive yourself after the procedure. ?Blood Pressure Medicine: Do not forget to take your blood pressure medicine with a sip of water the morning of the procedure. If your Diastolic (lower reading) is above 100 mmHg, elective cases will be cancelled/rescheduled. ?Blood thinners: These will need to be stopped for procedures. Notify our staff if you are taking any blood thinners. Depending on which one you take, there will be specific instructions on how and when to stop it. ?Diabetics on insulin: Notify the staff so that you can be scheduled 1st case in the morning. If your diabetes requires high dose insulin, take only ? of your normal insulin dose the morning of the procedure and notify the staff that you have done so. ?Preventing infections: Shower with an antibacterial soap the morning of your procedure. ?Build-up your immune system: Take 1000 mg of Vitamin C with every meal (3 times a day) the day prior to your procedure. ?Antibiotics: Inform the staff if you have a condition or reason that requires you to take antibiotics before dental procedures. ?Pregnancy: If you are pregnant, call and cancel the procedure. ?Sickness: If  you have a cold, fever, or any active infections, call and cancel the procedure. ?Arrival: You must be in the facility at least 30 minutes prior to your scheduled procedure. ?Children: Do not bring children with you. ?Dress appropriately: Bring dark clothing that you would not mind if they get stained. ?Valuables: Do not bring any jewelry or valuables. ? ?Reasons to call and reschedule or cancel your procedure: (Following these recommendations will minimize the risk of a serious complication.) ?Surgeries: Avoid having procedures within 2 weeks of any surgery. (Avoid for 2 weeks before or after any surgery). ?Flu Shots: Avoid having procedures within 2 weeks of a flu shots. (Avoid for 2 weeks before or after immunizations). ?Barium: Avoid having a procedure within 7-10 days after having had a radiological study involving the use of radiological contrast. (Myelograms, Barium swallow or enema study). ?Heart attacks: Avoid any elective procedures or surgeries for the initial 6 months after a "Myocardial Infarction" (Heart Attack). ?Blood thinners: It is imperative that you stop these medications before procedures. Let us know if you if you take any blood thinner.  ?Infection: Avoid procedures during or within two weeks of an infection (including chest colds or gastrointestinal problems). Symptoms associated with infections include: Localized redness, fever, chills, night sweats or profuse sweating, burning sensation when voiding, cough, congestion, stuffiness, runny nose, sore throat, diarrhea, nausea, vomiting, cold or Flu symptoms, recent or current infections. It is specially important if the infection is over the area that we intend to treat. ?Heart and lung problems: Symptoms that may suggest an active cardiopulmonary problem include: cough, chest pain, breathing difficulties or shortness of breath, dizziness, ankle swelling, uncontrolled high or unusually low blood pressure, and/or palpitations. If you are    experiencing any of these symptoms, cancel your procedure and contact your primary care physician for an evaluation. ? ?Remember:  ?Regular Business hours are:  ?Monday to Thursday 8:00 AM to 4:00 PM ? ?Provider's Schedule: ?Samiha Denapoli, MD:  ?Procedure days: Tuesday and Thursday 7:30 AM to 4:00 PM ? ?Bilal Lateef, MD:  ?Procedure days: Monday and Wednesday 7:30 AM to 4:00 PM ?______________________________________________________________________ ? ____________________________________________________________________________________________ ? ?General Risks and Possible Complications ? ?Patient Responsibilities: It is important that you read this as it is part of your informed consent. It is our duty to inform you of the risks and possible complications associated with treatments offered to you. It is your responsibility as a patient to read this and to ask questions about anything that is not clear or that you believe was not covered in this document. ? ?Patient?s Rights: You have the right to refuse treatment. You also have the right to change your mind, even after initially having agreed to have the treatment done. However, under this last option, if you wait until the last second to change your mind, you may be charged for the materials used up to that point. ? ?Introduction: Medicine is not an exact science. Everything in Medicine, including the lack of treatment(s), carries the potential for danger, harm, or loss (which is by definition: Risk). In Medicine, a complication is a secondary problem, condition, or disease that can aggravate an already existing one. All treatments carry the risk of possible complications. The fact that a side effects or complications occurs, does not imply that the treatment was conducted incorrectly. It must be clearly understood that these can happen even when everything is done following the highest safety standards. ? ?No treatment: You can choose not to proceed with the  proposed treatment alternative. The ?PRO(s)? would include: avoiding the risk of complications associated with the therapy. The ?CON(s)? would include: not getting any of the treatment benefits. These benefits fall under one of three categories: diagnostic; therapeutic; and/or palliative. Diagnostic benefits include: getting information which can ultimately lead to improvement of the disease or symptom(s). Therapeutic benefits are those associated with the successful treatment of the disease. Finally, palliative benefits are those related to the decrease of the primary symptoms, without necessarily curing the condition (example: decreasing the pain from a flare-up of a chronic condition, such as incurable terminal cancer). ? ?General Risks and Complications: These are associated to most interventional treatments. They can occur alone, or in combination. They fall under one of the following six (6) categories: no benefit or worsening of symptoms; bleeding; infection; nerve damage; allergic reactions; and/or death. ?No benefits or worsening of symptoms: In Medicine there are no guarantees, only probabilities. No healthcare provider can ever guarantee that a medical treatment will work, they can only state the probability that it may. Furthermore, there is always the possibility that the condition may worsen, either directly, or indirectly, as a consequence of the treatment. ?Bleeding: This is more common if the patient is taking a blood thinner, either prescription or over the counter (example: Goody Powders, Fish oil, Aspirin, Garlic, etc.), or if suffering a condition associated with impaired coagulation (example: Hemophilia, cirrhosis of the liver, low platelet counts, etc.). However, even if you do not have one on these, it can still happen. If you have any of these conditions, or take one of these drugs, make sure to notify your treating physician. ?Infection: This is more common in patients with a compromised  immune system, either due to disease (example:   diabetes, cancer, human immunodeficiency virus [HIV], etc.), or due to medications or treatments (example: therapies used to treat cancer and rheumatological diseas

## 2021-12-16 ENCOUNTER — Telehealth: Payer: Self-pay

## 2021-12-16 MED ORDER — NITROFURANTOIN MONOHYD MACRO 100 MG PO CAPS: 100.0000 mg | ORAL_CAPSULE | Freq: Every day | ORAL | 11 refills | Status: DC

## 2021-12-16 NOTE — Telephone Encounter (Signed)
Medication refilled

## 2021-12-22 ENCOUNTER — Encounter: Payer: Self-pay | Admitting: Pain Medicine

## 2021-12-22 ENCOUNTER — Ambulatory Visit
Admission: RE | Admit: 2021-12-22 | Discharge: 2021-12-22 | Disposition: A | Discharge status: Home / Self Care | Payer: Medicare HMO | Source: Ambulatory Visit | Attending: Pain Medicine | Admitting: Pain Medicine

## 2021-12-22 ENCOUNTER — Ambulatory Visit (HOSPITAL_BASED_OUTPATIENT_CLINIC_OR_DEPARTMENT_OTHER): Payer: Medicare HMO | Admitting: Pain Medicine

## 2021-12-22 ENCOUNTER — Ambulatory Visit
Admission: RE | Admit: 2021-12-22 | Discharge: 2021-12-22 | Disposition: A | Discharge status: Home / Self Care | Payer: Medicare HMO | Source: Home / Self Care | Attending: Pain Medicine | Admitting: Pain Medicine

## 2021-12-22 VITALS — BP 107/87 | HR 89 | Temp 97.9°F | Resp 16 | Ht 63.0 in

## 2021-12-22 DIAGNOSIS — M153 Secondary multiple arthritis: Secondary | ICD-10-CM | POA: Insufficient documentation

## 2021-12-22 DIAGNOSIS — G8929 Other chronic pain: Secondary | ICD-10-CM | POA: Insufficient documentation

## 2021-12-22 DIAGNOSIS — Z888 Allergy status to other drugs, medicaments and biological substances status: Secondary | ICD-10-CM | POA: Insufficient documentation

## 2021-12-22 DIAGNOSIS — M25561 Pain in right knee: Secondary | ICD-10-CM | POA: Diagnosis not present

## 2021-12-22 DIAGNOSIS — M25562 Pain in left knee: Secondary | ICD-10-CM | POA: Insufficient documentation

## 2021-12-22 DIAGNOSIS — Z6841 Body Mass Index (BMI) 40.0 and over, adult: Secondary | ICD-10-CM | POA: Diagnosis not present

## 2021-12-22 DIAGNOSIS — M174 Other bilateral secondary osteoarthritis of knee: Secondary | ICD-10-CM

## 2021-12-22 DIAGNOSIS — E66813 Obesity, class 3: Secondary | ICD-10-CM

## 2021-12-22 MED ORDER — ROPIVACAINE HCL 2 MG/ML IJ SOLN
5.0000 mL | Freq: Once | INTRAMUSCULAR | Status: AC
  Administered 2021-12-22: 5 mL via INTRA_ARTICULAR
  Filled 2021-12-22: qty 20

## 2021-12-22 MED ORDER — TRIAMCINOLONE ACETONIDE 32 MG IX SRER
32.0000 mg | Freq: Once | INTRA_ARTICULAR | Status: AC
  Administered 2021-12-22: 32 mg via INTRA_ARTICULAR
  Filled 2021-12-22: qty 5

## 2021-12-22 MED ORDER — MIDAZOLAM HCL 5 MG/5ML IJ SOLN
0.5000 mg | Freq: Once | INTRAMUSCULAR | Status: AC
  Administered 2021-12-22: 2 mg via INTRAVENOUS
  Filled 2021-12-22: qty 5

## 2021-12-22 MED ORDER — LACTATED RINGERS IV SOLN
1000.0000 mL | Freq: Once | INTRAVENOUS | Status: AC
  Administered 2021-12-22: 1000 mL via INTRAVENOUS

## 2021-12-22 MED ORDER — PENTAFLUOROPROP-TETRAFLUOROETH EX AERO
INHALATION_SPRAY | Freq: Once | CUTANEOUS | Status: AC
  Administered 2021-12-22: 30 via TOPICAL

## 2021-12-22 MED ORDER — LIDOCAINE HCL 2 % IJ SOLN
20.0000 mL | Freq: Once | INTRAMUSCULAR | Status: AC
  Administered 2021-12-22: 400 mg
  Filled 2021-12-22: qty 20

## 2021-12-22 MED ORDER — LIDOCAINE HCL (PF) 1 % IJ SOLN
5.0000 mL | Freq: Once | INTRAMUSCULAR | Status: AC
  Administered 2021-12-22: 5 mL
  Filled 2021-12-22: qty 5

## 2021-12-22 NOTE — Progress Notes (Signed)
PROVIDER NOTE: Interpretation of information contained herein should be left to medically-trained personnel. Specific patient instructions are provided elsewhere under "Patient Instructions" section of medical record. This document was created in part using STT-dictation technology, any transcriptional errors that may result from this process are unintentional.  ?Patient: Althea Charon ?Type: Established ?DOB: 05/22/1971 ?MRN: [id number] ?PCP: Idelle Crouch, MD  Service: Procedure ?DOS: 12/22/2021 ?Setting: Ambulatory ?Location: Ambulatory outpatient facility ?Delivery: Face-to-face Provider: Gaspar Cola, MD ?Specialty: Interventional Pain Management ?Specialty designation: 09 ?Location: Outpatient facility ?Ref. Prov.: Idelle Crouch, MD   ? ?Primary Reason for Visit: Interventional Pain Management Treatment. ?CC: Knee Pain (bilateral) ?  ?Procedure:          ? Type: ER-steroid ([place]) Intra-articular Knee Injection ?Laterality: Bilateral (-50) ?No.: 3  Series: n/a ?Level/approach: Lateral ?Imaging guidance: None required (CPT-20610) ?Anesthesia: Local anesthesia (1-2% Lidocaine) ?Anxiolysis: IV Versed 2.0 mg ?Sedation: None. ? ?Purpose: Diagnostic/Therapeutic ?Indications: Knee arthralgia associated to osteoarthritis of the knee ?1. Chronic knee pain (1ry area of Pain) (Bilateral) (R>L)   ?2. Secondary Osteoarthritis of knee (Bilateral) (R>L)   ?3. Secondary osteoarthritis of multiple sites   ? Class 3 severe obesity due to excess calories with serious comorbidity and body mass index (BMI) of 60.0 to 69.9 in adult [facility])   ? Allergy to iodine   ? ?NAS-11 score:  ? Pre-procedure: 7  (7 right, 5 left)/10  ? Post-procedure: 4 /10  ?  ? ?Pre-Procedure Preparation  ?Monitoring: As per clinic protocol.  ?Risk Assessment: ?Vitals:  [id number] body mass index is 64.13 kg/m? as calculated from the following: ?  Height as of this encounter: '5\' 3"'$  (1.6 m). ?  Weight as of 11/17/21: 362 lb (164.2 kg)., Rate:82  , BP:119/89, Resp:18, Temp:97.9 ?F (36.6 ?C), SpO2:99 %  ?Allergies: She is allergic to bactrim [sulfamethoxazole-trimethoprim], omalizumab, ciprofloxacin, shellfish allergy, and nsaids.  ?Precautions: No additional precautions required  ?Blood-thinner(s): None at this time  ?Coagulopathies: Reviewed. None identified.   ?Active Infection(s): Reviewed. None identified. Ms. Cronce is afebrile  ? ?Location setting: Exam room ?Position: Sitting w/ knee bent 90 degrees ?Safety Precautions: Patient was assessed for positional comfort and pressure points before starting the procedure. ?Prepping solution: DuraPrep (Iodine Povacrylex [0.7% available iodine] and Isopropyl Alcohol, 74% w/w) ?Prep Area: Entire knee region ?Approach: percutaneous, just above the tibial plateau, lateral to the infrapatellar tendon. ?Intended target: Intra-articular knee space ?Materials: ?Tray: Block ?Needle(s): Regular ?Qty: 1/side ?Length: (52m) 3.5-inch ?Gauge: 22G  ? ?Meds ordered this encounter  ?Medications  ? lidocaine (XYLOCAINE) 2 % (with pres) injection 400 mg  ? pentafluoroprop-tetrafluoroeth (GEBAUERS) aerosol  ? lactated ringers infusion 1,000 mL  ? midazolam (VERSED) 5 MG/5ML injection 0.5-2 mg  ?  Make sure Flumazenil is available in the pyxis when using this medication. If oversedation occurs, administer 0.2 mg IV over 15 sec. If after 45 sec no response, administer 0.2 mg again over 1 min; may repeat at 1 min intervals; not to exceed 4 doses (1 mg)  ? lidocaine (PF) (XYLOCAINE) 1 % injection 5 mL  ? ropivacaine (PF) 2 mg/mL (0.2%) (NAROPIN) injection 5 mL  ? Triamcinolone Acetonide SRER 32 mg  ?  Maintain refrigerated.  Prepared suspension may be stored up to 4 hours at ambient conditions.  ? Triamcinolone Acetonide SRER 32 mg  ?  Maintain refrigerated.  Prepared suspension may be stored up to 4 hours at ambient conditions.  ?  Orders Placed This Encounter  ?Procedures  ? KNEE INJECTION  ?  Local Anesthetic & Steroid injection.  ?   Scheduling Instructions:  ?   Side(s): Bilateral Knee  ?   Sedation: None  ?   Timeframe: Today  ?  Order Specific Question:   Where will this procedure be performed?  ?  Answer:   [facility]  ? [place] [facility] 1-60 MIN NO REPORT  ?  Intraoperative interpretation by procedural physician at [place].  ?  Standing Status:   Standing  ?  Number of Occurrences:   1  ?  Order Specific Question:   Reason for exam:  ?  Answer:   Assistance in needle guidance and placement for procedures requiring needle placement in or near specific anatomical locations not easily accessible without such assistance.  ? Informed Consent Details: Physician/Practitioner Attestation; Transcribe to consent form and obtain patient signature  ?  Note: Always confirm laterality of pain with Ms. Brigitte Pulse, before procedure. ?Transcribe to consent form and obtain patient signature.  ?  Order Specific Question:   Physician/Practitioner attestation of informed consent for procedure/surgical case  ?  Answer:   I, the physician/practitioner, attest that I have discussed with the patient the benefits, risks, side effects, alternatives, likelihood of achieving goals and potential problems during recovery for the procedure that I have provided informed consent.  ?  Order Specific Question:   Procedure  ?  Answer:   Bilateral intra-articular knee arthrocentesis (aspiration and/or injection)  ?  Order Specific Question:   Physician/Practitioner performing the procedure  ?  Answer:   Stan Cantave A. [name], MD  ?  Order Specific Question:   Indication/Reason  ?  Answer:   Chronic bilateral knee pain secondary to knee arthropathy/arthralgia  ? Provide equipment / supplies at bedside  ?  "Block Tray" (Disposable  single use) ?Needle type: SpinalSpinal ?Amount/quantity: 2 ?Size: Regular (3.5-inch) ?Gauge: 22G  ?  Standing Status:   Standing  ?  Number of Occurrences:   1  ?  Order Specific Question:   Specify  ?  Answer:   Block Tray  ?  Miscellanous precautions  ?  NOTE: Although It is true that patients can have allergies to shellfish and that shellfish contain iodine, most shellfish  allergies are due to two protein allergens present in the shellfish: tropomyosins and parvalbumin. Not all patients with shellfish allergies are allergic to iodine. However, as a precaution, avoid using iodine containing products.  ?  Standing Status:   Standing  ?  Number of Occurrences:   1  ?  ? ?Time-out: 1134 I initiated and conducted the "Time-out" before starting the procedure, as per protocol. The patient was asked to participate by confirming the accuracy of the "Time Out" information. Verification of the correct person, site, and procedure were performed and confirmed by me, the nursing staff, and the patient. "Time-out" conducted as per Joint Commission's Universal Protocol (UP.01.01.01). ?Procedure checklist: Completed  ? ?H&P (Pre-op  Assessment)  ?Ms. Mahaffy is a 51 y.o. (year old), female patient, seen today for interventional treatment. She  has a past surgical history that includes Laparoscopic partial gastrectomy; Shoulder arthroscopy (Right); Carpal tunnel release (Bilateral); Diagnostic laparoscopy; Cholecystectomy; Trigger finger release (Right); Thyroidectomy (N/A, 11/12/2015); left trigger finger; Roux-en-Y Gastric Bypass (06/03/2017); Hiatal hernia repair; peniculectomy (N/A, 07/05/2018); Total hip arthroplasty (Right, 11/27/2019); Joint replacement (Bilateral, hip); Appendectomy; Trigger finger release (Right, 04/24/2020); Colonoscopy with propofol (N/A, 02/11/2021); and Esophagogastroduodenoscopy (egd) with propofol (N/A, 02/11/2021). Ms. Girgis has a current medication list which includes the following  prescription(s): accu-chek aviva plus, acetaminophen, acidophilus, albuterol, amitriptyline, vitamin c, biotin, butalbital-acetaminophen-caffeine, calcium carbonate, clonazepam, b-12, diclofenac sodium, diphenhydramine, dupixent, epinephrine,  ergocalciferol, famotidine, fluticasone furoate-vilanterol, furosemide, emgality, accu-chek aviva plus, levocetirizine, lidocaine, magnesium oxide, meclizine, metformin, metoprolol tartrate, montelukast, tab-a-vit

## 2021-12-25 DIAGNOSIS — H524 Presbyopia: Secondary | ICD-10-CM | POA: Diagnosis not present

## 2021-12-25 DIAGNOSIS — H35033 Hypertensive retinopathy, bilateral: Secondary | ICD-10-CM | POA: Diagnosis not present

## 2021-12-25 DIAGNOSIS — Z0100 Encounter for examination of eyes and vision without abnormal findings: Secondary | ICD-10-CM | POA: Diagnosis not present

## 2021-12-29 DIAGNOSIS — E1142 Type 2 diabetes mellitus with diabetic polyneuropathy: Secondary | ICD-10-CM | POA: Diagnosis not present

## 2021-12-30 DIAGNOSIS — F331 Major depressive disorder, recurrent, moderate: Secondary | ICD-10-CM | POA: Diagnosis not present

## 2021-12-30 DIAGNOSIS — F419 Anxiety disorder, unspecified: Secondary | ICD-10-CM | POA: Diagnosis not present

## 2021-12-30 DIAGNOSIS — F4312 Post-traumatic stress disorder, chronic: Secondary | ICD-10-CM | POA: Diagnosis not present

## 2021-12-31 DIAGNOSIS — Z6841 Body Mass Index (BMI) 40.0 and over, adult: Secondary | ICD-10-CM | POA: Diagnosis not present

## 2021-12-31 DIAGNOSIS — E890 Postprocedural hypothyroidism: Secondary | ICD-10-CM | POA: Diagnosis not present

## 2021-12-31 DIAGNOSIS — E118 Type 2 diabetes mellitus with unspecified complications: Secondary | ICD-10-CM | POA: Diagnosis not present

## 2021-12-31 DIAGNOSIS — E1169 Type 2 diabetes mellitus with other specified complication: Secondary | ICD-10-CM | POA: Diagnosis not present

## 2021-12-31 DIAGNOSIS — E785 Hyperlipidemia, unspecified: Secondary | ICD-10-CM | POA: Diagnosis not present

## 2021-12-31 DIAGNOSIS — Z79899 Other long term (current) drug therapy: Secondary | ICD-10-CM | POA: Diagnosis not present

## 2021-12-31 DIAGNOSIS — I10 Essential (primary) hypertension: Secondary | ICD-10-CM | POA: Diagnosis not present

## 2022-01-01 DIAGNOSIS — R399 Unspecified symptoms and signs involving the genitourinary system: Secondary | ICD-10-CM | POA: Diagnosis not present

## 2022-01-04 ENCOUNTER — Ambulatory Visit: Payer: Medicare HMO | Admitting: Urology

## 2022-01-04 ENCOUNTER — Encounter: Payer: Self-pay | Admitting: Urology

## 2022-01-04 VITALS — BP 93/67 | HR 84 | Ht 63.0 in

## 2022-01-04 DIAGNOSIS — N3020 Other chronic cystitis without hematuria: Secondary | ICD-10-CM

## 2022-01-04 MED ORDER — NITROFURANTOIN MONOHYD MACRO 100 MG PO CAPS: 100.0000 mg | ORAL_CAPSULE | Freq: Every day | ORAL | 11 refills | Status: DC

## 2022-01-04 NOTE — Progress Notes (Signed)
? ?01/04/2022 ?10:35 AM  ? ?Karen Lewis ?1970/12/14 ?[id number] ? ?Referring provider: Idelle Crouch, MD ?[facility] ?[place],  [place] [address] ? ?No chief complaint on file. ? ? ?HPI: ?The patient reports an infection at least every 3 months when she feels tired foul-smelling urine burning and respond favorably antibiotics.  She had the same problem years ago.  She recently had MRSA.  Years ago was treated with post intercourse Keflex ?  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  ?No incontinence.  Gets up once a night.  No hysterectomy.   ?  ?Work-up and pathophysiology of chronic cystitis discussed.  Call if ultrasound normal.  Call culture normal.  Reassess in 6 weeks on daily Macrodantin 100 mg 3x11 ?  ?Today ?Frequency stable.  Last culture negative.  Ultrasound normal. ?Infection free on Macrodantin.  Very pleased   ? ?Today ?Patient saw a nurse practitioner in November 2022 with a breakthrough infection and positive culture.  I have not seen her for a year. ?Recent urine culture a few days ago by primary care normal.  Clinically not infected today ? ? ?PMH: ?Past Medical History:  ?Diagnosis Date  ? Anemia   ? Anginal pain ([place])   ? Anxiety   ? Arthralgia of hip 07/29/2015  ? Arthritis   ? Arthritis, degenerative 07/29/2015  ? Asthma   ? Cephalalgia 07/25/2014  ? Dependence on unknown drug ([place])   ? multiplt controlled drug dependence  ? Depression   ? Diabetes mellitus without complication ([place])   ? Difficult intubation   ? per pt needs small tube (has had abrasions from tube in past)  ? Dysrhythmia   ? PVC's  ? Eczema   ? Fibromyalgia   ? Gastritis   ? GERD (gastroesophageal reflux disease)   ? Gonalgia 07/29/2015  ? Overview:  Overview:  The patient has had bilateral intra-articular Hyalgan injections done on 07/16/2014 and although she seems to do well with this type of therapy, apparently her insurance  company does not want to pay for they Hyalgan. On 11/27/2014 the patient underwent a bilateral genicular nerve block with excellent results. On 01/28/2015 she had a right knee genicular radiofrequency ablatio  ? Gout   ? H/O cardiovascular disorder 03/10/2015  ? H/O surgical procedure 12/05/2012  ? Overview:  LSG ([facility] - April 2013)    ? H/O thyroid disease 03/10/2015  ? Headache   ? Herpes   ? History of artificial joint 07/29/2015  ? History of hiatal hernia   ? Hypertension   ? Hypomagnesemia   ? Hypothyroidism   ? IDA (iron deficiency anemia) 05/28/2019  ? LBP (low back pain) 07/29/2015  ? Neuromuscular disorder ([place])   ? Obesity   ? PCOS (polycystic ovarian syndrome)   ? Primary osteoarthritis of both knees 07/29/2015  ? Sleep apnea   ? not using a Cpap machine at this time - most recent test mild apnea does not qualify for test (not OSA)  ? Thyroid nodule bilateral  ? Umbilical hernia   ? ? ?Surgical History: ?Past Surgical History:  ?Procedure Laterality Date  ? APPENDECTOMY    ? CARPAL TUNNEL RELEASE Bilateral   ? CHOLECYSTECTOMY    ? COLONOSCOPY WITH PROPOFOL N/A 02/11/2021  ? Procedure: COLONOSCOPY WITH PROPOFOL;  Surgeon: Toledo, Benay Pike, MD;  Location: [facility];  Service: Gastroenterology;  Laterality: N/A;  ?  DIAGNOSTIC LAPAROSCOPY    ? ESOPHAGOGASTRODUODENOSCOPY (EGD) WITH PROPOFOL N/A 02/11/2021  ? Procedure: ESOPHAGOGASTRODUODENOSCOPY (EGD) WITH PROPOFOL;  Surgeon: Toledo, Benay Pike, MD;  Location: [facility];  Service: Gastroenterology;  Laterality: N/A;  ? HIATAL HERNIA REPAIR    ? JOINT REPLACEMENT Bilateral hip  ? LAPAROSCOPIC PARTIAL GASTRECTOMY    ? left trigger finger    ? peniculectomy N/A 07/05/2018  ? ROUX-EN-Y GASTRIC BYPASS  06/03/2017  ? SHOULDER ARTHROSCOPY Right   ? THYROIDECTOMY N/A 11/12/2015  ? Procedure: THYROIDECTOMY;  Surgeon: Clyde Canterbury, MD;  Location: [facility];  Service: ENT;  Laterality: N/A;  ? TOTAL HIP ARTHROPLASTY Right 11/27/2019  ? Procedure: TOTAL HIP ARTHROPLASTY  ANTERIOR APPROACH;  Surgeon: Hessie Knows, MD;  Location: [facility];  Service: Orthopedics;  Laterality: Right;  ? TRIGGER FINGER RELEASE Right   ? TRIGGER FINGER RELEASE Right 04/24/2020  ? Procedure: Right ring and middle trigger release;  Surgeon: Hessie Knows, MD;  Location: [facility];  Service: Orthopedics;  Laterality: Right;  ? ? ?Home Medications:  ?Allergies as of 01/04/2022   ? ?   Reactions  ? Bactrim [sulfamethoxazole-trimethoprim] Hives  ? Omalizumab Itching, Hives  ? Ciprofloxacin Other (See Comments)  ? myalgia  ? Shellfish Allergy Other (See Comments)  ? + positive allergy test  ? Nsaids Nausea Only, Other (See Comments)  ? Stomach upset  ? ?  ? ?  ?Medication List  ?  ? ?  ? Accurate as of January 04, 2022 10:35 AM. If you have any questions, ask your nurse or doctor.  ?  ?  ? ?  ? ?Accu-Chek Aviva Plus test strip ?Generic drug: glucose blood ?Use 2 (two) times daily ?  ?Accu-Chek Aviva Plus test strip ?Generic drug: glucose blood ?  ?acetaminophen 650 MG CR tablet ?Commonly known as: TYLENOL ?Take 650-1,300 mg by mouth every 8 (eight) hours as needed for pain. ?  ?acidophilus Caps capsule ?Take 1 capsule by mouth daily. ?  ?albuterol 108 (90 Base) MCG/ACT inhaler ?Commonly known as: VENTOLIN HFA ?Inhale 2 puffs into the lungs every 6 (six) hours as needed for wheezing or shortness of breath. ?  ?amitriptyline 100 MG tablet ?Commonly known as: ELAVIL ?Take 100 mg by mouth at bedtime. ?  ?B-12 2500 MCG Tabs ?Take 2,500 mcg by mouth daily. ?  ?Biotin 10 MG Caps ?Take 10 mg by mouth daily. ?  ?butalbital-acetaminophen-caffeine 50-325-40 MG tablet ?Commonly known as: FIORICET ?Take 1 tablet by mouth every 6 (six) hours as needed for migraine. ?  ?calcium carbonate 750 MG chewable tablet ?Commonly known as: TUMS EX ?Chew 1 tablet by mouth daily. ?  ?clonazePAM 0.5 MG tablet ?Commonly known as: KLONOPIN ?Take 1 tablet (0.5 mg total) by mouth 3 (three) times daily as needed for anxiety. ?  ?diclofenac sodium 1  % Gel ?Commonly known as: Voltaren ?Apply 2 g topically 4 (four) times daily. ?What changed:  ?when to take this ?reasons to take this ?  ?diphenhydrAMINE 25 MG tablet ?Commonly known as: BENADRYL ?Take 25 mg by mouth every 8 (eight) hours as needed for itching or allergies. ?  ?Dupixent 300 MG/2ML Sopn ?Generic drug: Dupilumab ?Inject 300 mg into the skin every 14 (fourteen) days. ?  ?Emgality 120 MG/ML Soaj ?Generic drug: Galcanezumab-gnlm ?Inject 120 mg into the skin every 30 (thirty) days. ?  ?EPINEPHrine 0.3 mg/0.3 mL Soaj injection ?Commonly known as: EPI-PEN ?Inject 0.3 mg into the muscle as needed for anaphylaxis. ?  ?ergocalciferol 1.25 MG (50000 UT)  capsule ?Commonly known as: VITAMIN D2 ?Take 50,000 Units by mouth 2 (two) times a week. ?  ?famotidine 20 MG tablet ?Commonly known as: PEPCID ?Take 20 mg by mouth at bedtime. ?  ?fluticasone furoate-vilanterol 200-25 MCG/INH Aepb ?Commonly known as: BREO ELLIPTA ?Inhale 1 puff into the lungs daily. ?  ?furosemide 20 MG tablet ?Commonly known as: LASIX ?Take 20 mg by mouth daily as needed. ?  ?levocetirizine 5 MG tablet ?Commonly known as: XYZAL ?Take 5 mg by mouth at bedtime. ?  ?levothyroxine 125 MCG tablet ?Commonly known as: SYNTHROID ?Take 200 mcg by mouth daily before breakfast. ?  ?lidocaine 5 % ?Commonly known as: Lidoderm ?Place 1 patch onto the skin every 12 (twelve) hours. Remove & Discard patch within 12 hours or as directed by MD ?  ?lisinopril 5 MG tablet ?Commonly known as: ZESTRIL ?Take 5 mg by mouth at bedtime. ?  ?magnesium oxide 400 MG tablet ?Commonly known as: MAG-OX ?Take 1,200 mg by mouth 2 (two) times daily. Take 1200 mg by mouth in the morning and 1200 mg at bedtime ?  ?meclizine 25 MG tablet ?Commonly known as: ANTIVERT ?Take 25 mg by mouth 3 (three) times daily as needed for dizziness. ?  ?metFORMIN 1000 MG tablet ?Commonly known as: GLUCOPHAGE ?Take 1,000 mg by mouth 2 (two) times daily with a meal. ?  ?metoprolol tartrate 50 MG  tablet ?Commonly known as: LOPRESSOR ?Take 100 mg by mouth 2 (two) times daily. ?  ?montelukast 10 MG tablet ?Commonly known as: SINGULAIR ?Take 10 mg by mouth at bedtime. ?  ?naphazoline-pheniramine 0.025

## 2022-01-10 NOTE — Progress Notes (Signed)
Patient: Karen Lewis  Service Category: E/M  Provider: Gaspar Cola, MD  ?DOB: 1971/03/29  DOS: 01/12/2022  Location: Office  ?MRN: [id number]  Setting: Ambulatory outpatient  Referring Provider: Idelle Crouch, MD  ?Type: Established Patient  Specialty: Interventional Pain Management  PCP: Idelle Crouch, MD  ?Location: Remote location  Delivery: TeleHealth    ? ?Virtual Encounter - Pain Management ?PROVIDER NOTE: Information contained herein reflects review and annotations entered in association with encounter. Interpretation of such information and data should be left to medically-trained personnel. Information provided to patient can be located elsewhere in the medical record under "Patient Instructions". Document created using STT-dictation technology, any transcriptional errors that may result from process are unintentional.  ?  ?Contact & Pharmacy ?Preferred: [phone] ?Home: [phone] (home) ?Mobile: [phone] (mobile) ?E-mail: [email or web address]  ?[address] ?[place] [address] ?Phone: [phone] Fax: [phone] ?  ?Pre-screening  ?Karen Lewis offered "in-person" vs "virtual" encounter. She indicated preferring virtual for this encounter.  ? ?Reason ?COVID-19*  Social distancing based on CDC and AMA recommendations.  ? ?I contacted Karen Lewis on 01/12/2022 via telephone.      I clearly identified myself as Gaspar Cola, MD. I verified that I was speaking with the correct person using two identifiers (Name: Karen Lewis, and date of birth: 1970-11-08). ? ?Consent ?I sought verbal advanced consent from Karen Lewis for virtual visit interactions. I informed Karen Lewis of possible security and privacy concerns, risks, and limitations associated with providing "not-in-person" medical evaluation and management services. I also informed Karen Lewis of the availability of "in-person" appointments. Finally, I informed her  that there would be a charge for the virtual visit and that she could be  personally, fully or partially, financially responsible for it. Karen Lewis expressed understanding and agreed to proceed.  ? ?Historic Elements   ?Karen Lewis is a 51 y.o. year old, female patient evaluated today after our last contact on 12/22/2021. Karen Lewis  has a past medical history of Anemia, Anginal pain ([place]), Anxiety, Arthralgia of hip (07/29/2015), Arthritis, Arthritis, degenerative (07/29/2015), Asthma, Cephalalgia (07/25/2014), Dependence on unknown drug ([place]), Depression, Diabetes mellitus without complication ([place]), Difficult intubation, Dysrhythmia, Eczema, Fibromyalgia, Gastritis, GERD (gastroesophageal reflux disease), Gonalgia (07/29/2015), Gout, H/O cardiovascular disorder (03/10/2015), H/O surgical procedure (12/05/2012), H/O thyroid disease (03/10/2015), Headache, Herpes, History of artificial joint (07/29/2015), History of hiatal hernia, Hypertension, Hypomagnesemia, Hypothyroidism, IDA (iron deficiency anemia) (05/28/2019), LBP (low back pain) (07/29/2015), Neuromuscular disorder ([place]), Obesity, PCOS (polycystic ovarian syndrome), Primary osteoarthritis of both knees (07/29/2015), Sleep apnea, Thyroid nodule (bilateral), and Umbilical hernia. She also  has a past surgical history that includes Laparoscopic partial gastrectomy; Shoulder arthroscopy (Right); Carpal tunnel release (Bilateral); Diagnostic laparoscopy; Cholecystectomy; Trigger finger release (Right); Thyroidectomy (N/A, 11/12/2015); left trigger finger; Roux-en-Y Gastric Bypass (06/03/2017); Hiatal hernia repair; peniculectomy (N/A, 07/05/2018); Total hip arthroplasty (Right, 11/27/2019); Joint replacement (Bilateral, hip); Appendectomy; Trigger finger release (Right, 04/24/2020); Colonoscopy with propofol (N/A, 02/11/2021); and Esophagogastroduodenoscopy (egd) with propofol (N/A, 02/11/2021). Karen Lewis has a current medication list which includes the following  prescription(s): accu-chek aviva plus, acetaminophen, acidophilus, albuterol, amitriptyline, vitamin c, biotin, butalbital-acetaminophen-caffeine, calcium carbonate, clonazepam, b-12, diclofenac sodium, diphenhydramine, dupixent, epinephrine, ergocalciferol, famotidine, fluticasone furoate-vilanterol, furosemide, emgality, accu-chek aviva plus, levocetirizine, levothyroxine, lidocaine, lisinopril, magnesium oxide, meclizine, metformin, metoprolol tartrate, montelukast, tab-a-vite, naphazoline-pheniramine, naproxen sodium, nitrofurantoin (macrocrystal-monohydrate), omeprazole, oxycodone, ozempic (0.25 or 0.5 mg/dose), pregabalin, quetiapine, rizatriptan, rizatriptan, rosuvastatin, sertraline,  spironolactone, tizanidine, topiramate, zolpidem, oxycodone, and oxycodone. She  reports that she has never smoked. She has never used smokeless tobacco. She reports that she does not drink alcohol and does not use drugs. Karen Lewis is allergic to bactrim [sulfamethoxazole-trimethoprim], omalizumab, ciprofloxacin, shellfish allergy, and nsaids.  ? ?HPI  ?Today, she is being contacted for a post-procedure assessment.  The patient indicates having attained 100% relief of the pain for the duration of the local anesthetic which then went slowly down to an ongoing 75% improvement in her knee pain.  She refers that she has been more active lately and she is beginning to get some of the low back pain again, but her last lumbar facet block was done on 11/17/2021 and therefore we should try to space those further.  She understood and accepted.  In reviewing her medications I have noticed that she was due for a refill around 01/24/2022 however, it would seem that she has been doing much better and in fact she has not filled 2 of the 3 prescriptions that I wrote on 10/26/2021.  In fact, she refers that she still has some pills left.  If she was to fill those tomorrow, she should have enough medication to last until March 13, 2022. ? ?Post-procedure evaluation  ? Type: ER-steroid ([place]) Intra-articular Knee Injection ?Laterality: Bilateral (-50) ?No.: 3  Series: n/a ?Level/approach: Lateral ?Imaging guidance: None required (CPT-20610) ?Anesthesia: Local anesthesia (1-2% Lidocaine) ?Anxiolysis: IV Versed 2.0 mg ?Sedation: None. ? ?Purpose: Diagnostic/Therapeutic ?Indications: Knee arthralgia associated to osteoarthritis of the knee ?1. Chronic knee pain (1ry area of Pain) (Bilateral) (R>L)   ?2. Secondary Osteoarthritis of knee (Bilateral) (R>L)   ?3. Secondary osteoarthritis of multiple sites   ? Class 3 severe obesity due to excess calories with serious comorbidity and body mass index (BMI) of 60.0 to 69.9 in adult [facility])   ? Allergy to iodine   ? ?NAS-11 score:  ? Pre-procedure: 7  (7 right, 5 left)/10  ? Post-procedure: 4 /10  ?  ?Effectiveness:  ?Initial hour after procedure: 100 %. ?Subsequent 4-6 hours post-procedure: 100 % . ?Analgesia past initial 6 hours: 75 % (ongoing). ?Ongoing improvement:  ?Analgesic: The patient indicates currently enjoying an ongoing 75% relief of her knee pain. ?Function: Karen Lewis reports improvement in function ?ROM: Karen Lewis reports improvement in ROM ? ?Pharmacotherapy Assessment  ? ?Opioid Analgesic: Oxycodone IR 5 mg 1 tab PO 5X/day (#150/mo) (25 mg/day) ?MME/day: 37.5 mg/day.  ? ?Monitoring: ?[place] PMP: PDMP reviewed during this encounter.       ?Pharmacotherapy: No side-effects or adverse reactions reported. ?Compliance: No problems identified. ?Effectiveness: Clinically acceptable. ?Plan: Refer to "POC". UDS:  ?Summary  ?Date Value Ref Range Status  ?10/26/2021 Note  Final  ?  Comment:  ?  ==================================================================== ?[employer] Select 13 (MW) ?==================================================================== ?Test                             Result       Flag       Units ? ?Drug Present and Declared for Prescription Verification ?  7-aminoclonazepam               48           EXPECTED   ng/mg creat ?   7-aminoclonazepam is an expected metabolite of clonazepam. Source of ?   clonazepam is a scheduled prescription medication. ? ?  Noroxycodone  518          EXPECTED

## 2022-01-12 ENCOUNTER — Ambulatory Visit: Payer: Medicare HMO | Attending: Pain Medicine | Admitting: Pain Medicine

## 2022-01-12 DIAGNOSIS — M25561 Pain in right knee: Secondary | ICD-10-CM | POA: Diagnosis not present

## 2022-01-12 DIAGNOSIS — Z6841 Body Mass Index (BMI) 40.0 and over, adult: Secondary | ICD-10-CM

## 2022-01-12 DIAGNOSIS — G8929 Other chronic pain: Secondary | ICD-10-CM

## 2022-01-12 DIAGNOSIS — M153 Secondary multiple arthritis: Secondary | ICD-10-CM

## 2022-01-12 DIAGNOSIS — G894 Chronic pain syndrome: Secondary | ICD-10-CM | POA: Diagnosis not present

## 2022-01-12 DIAGNOSIS — M25562 Pain in left knee: Secondary | ICD-10-CM | POA: Diagnosis not present

## 2022-01-12 DIAGNOSIS — M545 Low back pain, unspecified: Secondary | ICD-10-CM | POA: Diagnosis not present

## 2022-01-12 DIAGNOSIS — M174 Other bilateral secondary osteoarthritis of knee: Secondary | ICD-10-CM | POA: Diagnosis not present

## 2022-01-20 ENCOUNTER — Other Ambulatory Visit: Payer: Self-pay

## 2022-01-20 NOTE — Patient Outreach (Signed)
[facility]) Care Management ? ?01/20/2022 ? ?Karen Lewis ?[birth year] ?[id number] ? ? ?Telephone call to patient for disease management.  Patient doing well.  Blood sugar ranging 100-120.  Diabetes management continues.  No concerns.   ? ?Care Plan : Diabetes Type 2 (Adult)  ?Updates made by Jon Billings, RN since 01/20/2022 12:00 AM  ?Completed 01/20/2022  ? ?Problem: Disease Progression (Diabetes, Type 2) Resolved 01/20/2022  ?  ? ?Long-Range Goal: Disease Progression Prevented or Minimized as evidenced by A1c less than 7.5 Completed 01/20/2022  ?Start Date: 09/30/2020  ?Expected End Date: 09/12/2022  ?This Visit's Progress: On track  ?Recent Progress: On track  ?Priority: High  ?Note:   ?Evidence-based guidance:  ?Prepare patient for laboratory and diagnostic exams based on risk and presentation.  ?Encourage lifestyle changes, such as increased intake of plant-based foods, stress reduction, consistent physical activity and smoking cessation to prevent long-term complications and chronic disease.   ?Individualize activity and exercise recommendations while considering potential limitations, such as neuropathy, retinopathy or the ability to prevent hyperglycemia or hypoglycemia.   ?Prepare patient for use of pharmacologic therapy that may include antihypertensive, analgesic, prostaglandin E1 with periodic adjustments, based on presenting chronic condition and laboratory results.  ?Assess signs/symptoms and risk factors for hypertension, sleep-disordered breathing, neuropathy (including changes in gait and balance), retinopathy, nephropathy and sexual dysfunction.  ?Address pregnancy planning and contraceptive choice, especially when prescribing antihypertensive or statin.  ?Ensure completion of annual comprehensive foot exam and dilated eye exam.   ?Implement additional individualized goals and interventions based on identified risk factors.  ?Prepare patient for consultation or referral for specialist  care, such as ophthalmology, neurology, cardiology, podiatry, nephrology or perinatology.   ?Notes: 2/84/13  Resolving duplicate goal ?  ? ?Task: Monitor and Manage Follow-up for Comorbidities Completed 01/20/2022  ?Due Date: 03/12/2022  ?Priority: Routine  ?Responsible User: Jon Billings, RN  ?Note:   ?Care Management Activities:  ?  ?- healthy lifestyle promoted ?- reduction of sedentary activity encouraged ?- response to pharmacologic therapy monitored  ?  ?Notes: 12/19/20 Patient A1c 8.5. Patient started on ozempic last month  ?06/18/21 Patient Blood sugars lower.  Last sugar this am 109.  A1c 7.0 with ozempic.  ?Diabetes Management Discussed: ?Medication adherence ?Reviewed importance of limiting carbs such as rice, potatoes, breads, and pastas. Also discussed limiting sweets and sugary drinks.  Discussed importance of portion control.  Also discussed importance of annual exams, foot exams, and eye exams.   ?  ?  ? ?Care Plan : RN Care Manager Plan of Care  ?Updates made by Jon Billings, RN since 01/20/2022 12:00 AM  ?  ? ?Problem: Chronic Disease Management and Care Coordination needs for diabetes   ?Priority: High  ?  ? ?Long-Range Goal: Development of plan of care for management of diabetes   ?Start Date: 11/03/2021  ?Expected End Date: 09/12/2022  ?This Visit's Progress: On track  ?Priority: High  ?Note:   ?Current Barriers:  ?Chronic Disease Management support and education needs related to DMII  ? ?RNCM Clinical Goal(s):  ?Patient will verbalize basic understanding of  DMII disease process and self health management plan as evidenced by A1c less than 7.0 ?continue to work with RN Care Manager to address care management and care coordination needs related to  DMII as evidenced by adherence to CM Team Scheduled appointments through collaboration with RN Care manager, provider, and care team.  ? ?Interventions: ?Education and support related to diabetes ?Inter-disciplinary care team collaboration (see  longitudinal plan of care) ?Evaluation of current treatment plan related to  self management and patient's adherence to plan as established by provider ? ? ?Diabetes Interventions:  (Status:  New goal.) Long Term Goal ?Assessed patient's understanding of A1c goal: <7% ?Provided education to patient about basic DM disease process ?Discussed plans with patient for ongoing care management follow up and provided patient with direct contact information for care management team ?Review of patient status, including review of consultants reports, relevant laboratory and other test results, and medications completed ?Lab Results  ?Component Value Date  ? HGBA1C 7.1 (H) 11/27/2019  ?Last A1c reported 07/12/21- 6.5 ? ?01/20/22 A1c 6.9 Patient doing well.  Blood sugars around 100-120.  Discussed diet management of diabetes.  No concerns.    ? ?Patient Goals/Self-Care Activities: Diabetes ?Take all medications as prescribed ?Attend all scheduled provider appointments ?check blood sugar at prescribed times: once daily ?enter blood sugar readings and medication or insulin into daily log ?take the blood sugar log to all doctor visits ?limit fast food meals to no more than 1 per week ? ?Follow Up Plan:  Telephone follow up appointment with care management team member scheduled for:  August ?The patient has been provided with contact information for the care management team and has been advised to call with any health related questions or concerns.  ? ?  ? ?Plan: Follow-up: Patient agrees to Care Plan and Follow-up. ?Follow-up in August.   ? ?Jone Baseman, RN, MSN ?[facility] Care Management ?Care Management Coordinator ?Direct Line [phone] ?Toll Free: [phone]  ?Fax: [phone] ? ?

## 2022-01-20 NOTE — Patient Instructions (Signed)
Patient Goals/Self-Care Activities: Diabetes ?Take all medications as prescribed ?Attend all scheduled provider appointments ?check blood sugar at prescribed times: once daily ?enter blood sugar readings and medication or insulin into daily log ?take the blood sugar log to all doctor visits ?limit fast food meals to no more than 1 per week ?

## 2022-01-26 DIAGNOSIS — F331 Major depressive disorder, recurrent, moderate: Secondary | ICD-10-CM | POA: Diagnosis not present

## 2022-01-26 DIAGNOSIS — F4312 Post-traumatic stress disorder, chronic: Secondary | ICD-10-CM | POA: Diagnosis not present

## 2022-01-26 DIAGNOSIS — F419 Anxiety disorder, unspecified: Secondary | ICD-10-CM | POA: Diagnosis not present

## 2022-02-09 DIAGNOSIS — G43719 Chronic migraine without aura, intractable, without status migrainosus: Secondary | ICD-10-CM | POA: Diagnosis not present

## 2022-02-10 DIAGNOSIS — F419 Anxiety disorder, unspecified: Secondary | ICD-10-CM | POA: Diagnosis not present

## 2022-02-10 DIAGNOSIS — F331 Major depressive disorder, recurrent, moderate: Secondary | ICD-10-CM | POA: Diagnosis not present

## 2022-02-10 DIAGNOSIS — F4312 Post-traumatic stress disorder, chronic: Secondary | ICD-10-CM | POA: Diagnosis not present

## 2022-02-17 DIAGNOSIS — F419 Anxiety disorder, unspecified: Secondary | ICD-10-CM | POA: Diagnosis not present

## 2022-02-17 DIAGNOSIS — F4312 Post-traumatic stress disorder, chronic: Secondary | ICD-10-CM | POA: Diagnosis not present

## 2022-02-17 DIAGNOSIS — F331 Major depressive disorder, recurrent, moderate: Secondary | ICD-10-CM | POA: Diagnosis not present

## 2022-02-24 DIAGNOSIS — E559 Vitamin D deficiency, unspecified: Secondary | ICD-10-CM | POA: Diagnosis not present

## 2022-02-24 DIAGNOSIS — E890 Postprocedural hypothyroidism: Secondary | ICD-10-CM | POA: Diagnosis not present

## 2022-02-24 DIAGNOSIS — E1169 Type 2 diabetes mellitus with other specified complication: Secondary | ICD-10-CM | POA: Diagnosis not present

## 2022-02-24 DIAGNOSIS — I152 Hypertension secondary to endocrine disorders: Secondary | ICD-10-CM | POA: Diagnosis not present

## 2022-02-24 DIAGNOSIS — E1159 Type 2 diabetes mellitus with other circulatory complications: Secondary | ICD-10-CM | POA: Diagnosis not present

## 2022-02-24 DIAGNOSIS — E119 Type 2 diabetes mellitus without complications: Secondary | ICD-10-CM | POA: Diagnosis not present

## 2022-02-24 DIAGNOSIS — E785 Hyperlipidemia, unspecified: Secondary | ICD-10-CM | POA: Diagnosis not present

## 2022-03-09 ENCOUNTER — Telehealth: Payer: Self-pay

## 2022-03-10 DIAGNOSIS — F4312 Post-traumatic stress disorder, chronic: Secondary | ICD-10-CM | POA: Diagnosis not present

## 2022-03-10 DIAGNOSIS — F331 Major depressive disorder, recurrent, moderate: Secondary | ICD-10-CM | POA: Diagnosis not present

## 2022-03-10 DIAGNOSIS — F419 Anxiety disorder, unspecified: Secondary | ICD-10-CM | POA: Diagnosis not present

## 2022-03-10 NOTE — Telephone Encounter (Signed)
It looks like she has a future order in for a knee injection.

## 2022-03-12 DIAGNOSIS — F331 Major depressive disorder, recurrent, moderate: Secondary | ICD-10-CM | POA: Diagnosis not present

## 2022-03-12 DIAGNOSIS — F4312 Post-traumatic stress disorder, chronic: Secondary | ICD-10-CM | POA: Diagnosis not present

## 2022-03-12 DIAGNOSIS — F419 Anxiety disorder, unspecified: Secondary | ICD-10-CM | POA: Diagnosis not present

## 2022-03-15 NOTE — Progress Notes (Deleted)
NO-SHOW to appointment (03/17/2022).  According to the patient's PMP, her last prescription filled was on 03/13/2022.  This prescription had been written on 10/26/2021.  Prior to that prescription the last one that she had f filled was on 01/22/2022, 50 days prior to the last one.  This means that 150 pills lasted her for 50 days, which is an average of 3 pills/day.  Based on my calculations, the patient is no longer in need of 5 pills/day.  This is likely the result of the patient's total hip arthroplasty.  Today we will adjust her prescriptions to reflect her current use.

## 2022-03-17 ENCOUNTER — Ambulatory Visit: Payer: Medicare HMO | Admitting: Pain Medicine

## 2022-03-17 DIAGNOSIS — Z79891 Long term (current) use of opiate analgesic: Secondary | ICD-10-CM

## 2022-03-17 DIAGNOSIS — M153 Secondary multiple arthritis: Secondary | ICD-10-CM

## 2022-03-17 DIAGNOSIS — Z79899 Other long term (current) drug therapy: Secondary | ICD-10-CM

## 2022-03-17 DIAGNOSIS — G894 Chronic pain syndrome: Secondary | ICD-10-CM

## 2022-03-17 DIAGNOSIS — G8929 Other chronic pain: Secondary | ICD-10-CM

## 2022-03-21 NOTE — Progress Notes (Unsigned)
PROVIDER NOTE: Interpretation of information contained herein should be left to medically-trained personnel. Specific patient instructions are provided elsewhere under "Patient Instructions" section of medical record. This document was created in part using STT-dictation technology, any transcriptional errors that may result from this process are unintentional.  Patient: Karen Lewis Type: Established DOB: 10/10/1970 MRN: [id number] PCP: Idelle Crouch, MD  Service: Procedure DOS: 03/25/2022 Setting: Ambulatory Location: Ambulatory outpatient facility Delivery: Face-to-face Provider: Gaspar Cola, MD Specialty: Interventional Pain Management Specialty designation: 09 Location: Outpatient facility Ref. Prov.: Doy Hutching Leonie Douglas, MD    Primary Reason for Visit: Interventional Pain Management Treatment. CC: No chief complaint on file.   Procedure:           Type: ER-steroid (Zilretta) Intra-articular Knee Injection Laterality: Bilateral (-50) No.: 3  Series: n/a Level/approach: Lateral Imaging guidance: None required ([id number]) Anesthesia: Local anesthesia (1-2% Lidocaine) Anxiolysis: None                 Sedation: None. DOS: 03/25/2022  Performed by: Gaspar Cola, MD  Purpose: Diagnostic/Therapeutic Indications: Knee arthralgia associated to osteoarthritis of the knee No diagnosis found. NAS-11 score:   Pre-procedure:  /10   Post-procedure:  /10     Pre-Procedure Preparation  Monitoring: As per clinic protocol.  Risk Assessment: Vitals:  [id number] body mass index is 64.13 kg/m as calculated from the following:   Height as of 01/04/22: '5\' 3"'$  (1.6 m).   Weight as of 11/17/21: 362 lb (164.2 kg)., Rate:  , BP: , Resp: , Temp: , SpO2:   Allergies: She is allergic to bactrim [sulfamethoxazole-trimethoprim], omalizumab, ciprofloxacin, shellfish allergy, and nsaids.  Precautions: No additional precautions required  Blood-thinner(s): None at this time   Coagulopathies: Reviewed. None identified.   Active Infection(s): Reviewed. None identified. Karen Lewis is afebrile   Location setting: Exam room Position: Sitting w/ knee bent 90 degrees Safety Precautions: Patient was assessed for positional comfort and pressure points before starting the procedure. Prepping solution: DuraPrep (Iodine Povacrylex [0.7% available iodine] and Isopropyl Alcohol, 74% w/w) Prep Area: Entire knee region Approach: percutaneous, just above the tibial plateau, lateral to the infrapatellar tendon. Intended target: Intra-articular knee space Materials: Tray: Block Needle(s): Regular Qty: 1/side Length: 1.5-inch Gauge: 25G   No orders of the defined types were placed in this encounter.   No orders of the defined types were placed in this encounter.    Time-out:   I initiated and conducted the "Time-out" before starting the procedure, as per protocol. The patient was asked to participate by confirming the accuracy of the "Time Out" information. Verification of the correct person, site, and procedure were performed and confirmed by me, the nursing staff, and the patient. "Time-out" conducted as per Joint Commission's Universal Protocol (UP.01.01.01). Procedure checklist: Completed   H&P (Pre-op  Assessment)  Karen Lewis is a 50 y.o. (year old), female patient, seen today for interventional treatment. She  has a past surgical history that includes Laparoscopic partial gastrectomy; Shoulder arthroscopy (Right); Carpal tunnel release (Bilateral); Diagnostic laparoscopy; Cholecystectomy; Trigger finger release (Right); Thyroidectomy (N/A, 11/12/2015); left trigger finger; Roux-en-Y Gastric Bypass (06/03/2017); Hiatal hernia repair; peniculectomy (N/A, 07/05/2018); Total hip arthroplasty (Right, 11/27/2019); Joint replacement (Bilateral, hip); Appendectomy; Trigger finger release (Right, 04/24/2020); Colonoscopy with propofol (N/A, 02/11/2021); and Esophagogastroduodenoscopy (egd)  with propofol (N/A, 02/11/2021). Ms. Deadwyler has a current medication list which includes the following prescription(s): accu-chek aviva plus, acetaminophen, acidophilus, albuterol, amitriptyline, vitamin c, biotin, butalbital-acetaminophen-caffeine, calcium carbonate, clonazepam, b-12, diclofenac sodium, diphenhydramine, dupixent,  epinephrine, ergocalciferol, famotidine, fluticasone furoate-vilanterol, furosemide, emgality, accu-chek aviva plus, levocetirizine, levothyroxine, lisinopril, magnesium oxide, meclizine, metformin, metoprolol tartrate, montelukast, tab-a-vite, naphazoline-pheniramine, naproxen sodium, nitrofurantoin (macrocrystal-monohydrate), omeprazole, oxycodone, ozempic (0.25 or 0.5 mg/dose), pregabalin, quetiapine, rizatriptan, rizatriptan, rosuvastatin, sertraline, spironolactone, tizanidine, topiramate, and zolpidem. Her primarily concern today is the No chief complaint on file.  She is allergic to bactrim [sulfamethoxazole-trimethoprim], omalizumab, ciprofloxacin, shellfish allergy, and nsaids.   Last encounter: My last encounter with her was on 03/17/2022. Pertinent problems: Karen Lewis has Fibromyalgia; Chronic knee pain (1ry area of Pain) (Bilateral) (R>L); Chronic low back pain (3ry area of Pain) (Bilateral) (R>L) w/o sciatica; Lumbar facet syndrome (Bilateral) (R>L); Secondary osteoarthritis of multiple sites; Grade 1 (1.4 cm) Anterolisthesis of L4 over L5; Chronic hip pain (2ry area of Pain) (Right); S/P THR (total hip replacement) (Left); Lumbar spondylosis; Chronic pain syndrome; Neurogenic pain; Upper extremity pain; Chronic shoulder pain (Left); Osteoarthritis of shoulder (Left); Osteoarthritis of hip (Right); Secondary Osteoarthritis of knee (Bilateral) (R>L); Spondylosis without myelopathy or radiculopathy, lumbosacral region; Panniculitis; Leg swelling; Edema of both legs; History of total hip replacement (Bilateral); S/P THR (total hip replacement) (Right) (11/27/2019); Pain and numbness  of left upper extremity; Cervical radiculitis (Left); DDD (degenerative disc disease), lumbosacral; Osteoarthritis of lumbar spine without myelopathy or radiculopathy; and Lumbosacral radiculopathy at S1 (Left) on their pertinent problem list. Pain Assessment: Severity of   is reported as a  /10. Location:    / . Onset:  . Quality:  . Timing:  . Modifying factor(s):  Marland Kitchen Vitals:  vitals were not taken for this visit.   Reason for encounter: "interventional pain management therapy due pain of at least four (4) weeks in duration, with failure to respond and/or inability to tolerate more conservative care.   Site Confirmation: Karen Lewis was asked to confirm the procedure and laterality before marking the site.  Consent: Before the procedure and under the influence of no sedative(s), amnesic(s), or anxiolytics, the patient was informed of the treatment options, risks and possible complications. To fulfill our ethical and legal obligations, as recommended by the American Medical Association's Code of Ethics, I have informed the patient of my clinical impression; the nature and purpose of the treatment or procedure; the risks, benefits, and possible complications of the intervention; the alternatives, including doing nothing; the risk(s) and benefit(s) of the alternative treatment(s) or procedure(s); and the risk(s) and benefit(s) of doing nothing. The patient was provided information about the general risks and possible complications associated with the procedure. These may include, but are not limited to: failure to achieve desired goals, infection, bleeding, organ or nerve damage, allergic reactions, paralysis, and death. In addition, the patient was informed of those risks and complications associated to Spine-related procedures, such as failure to decrease pain; infection (i.e.: Meningitis, epidural or intraspinal abscess); bleeding (i.e.: epidural hematoma, subarachnoid hemorrhage, or any other type of  intraspinal or peri-dural bleeding); organ or nerve damage (i.e.: Any type of peripheral nerve, nerve root, or spinal cord injury) with subsequent damage to sensory, motor, and/or autonomic systems, resulting in permanent pain, numbness, and/or weakness of one or several areas of the body; allergic reactions; (i.e.: anaphylactic reaction); and/or death. Furthermore, the patient was informed of those risks and complications associated with the medications. These include, but are not limited to: allergic reactions (i.e.: anaphylactic or anaphylactoid reaction(s)); adrenal axis suppression; blood sugar elevation that in diabetics may result in ketoacidosis or comma; water retention that in patients with history of congestive heart failure may result in  shortness of breath, pulmonary edema, and decompensation with resultant heart failure; weight gain; swelling or edema; medication-induced neural toxicity; particulate matter embolism and blood vessel occlusion with resultant organ, and/or nervous system infarction; and/or aseptic necrosis of one or more joints. Finally, the patient was informed that Medicine is not an exact science; therefore, there is also the possibility of unforeseen or unpredictable risks and/or possible complications that may result in a catastrophic outcome. The patient indicated having understood very clearly. We have given the patient no guarantees and we have made no promises. Enough time was given to the patient to ask questions, all of which were answered to the patient's satisfaction. Karen Lewis has indicated that she wanted to continue with the procedure. Attestation: I, the ordering provider, attest that I have discussed with the patient the benefits, risks, side-effects, alternatives, likelihood of achieving goals, and potential problems during recovery for the procedure that I have provided informed consent.  Date  Time: {[facility]-PAIN TIME CHOICES:21018001}   Prophylactic  antibiotics  Anti-infectives (From admission, onward)    None      Indication(s): None identified   Description of procedure   Start Time:   hrs  Local Anesthesia: Once the patient was positioned, prepped, and time-out was completed. The target area was identified located. The skin was marked with an approved surgical skin marker. Once marked, the skin (epidermis, dermis, and hypodermis), and deeper tissues (fat, connective tissue and muscle) were infiltrated with a small amount of a short-acting local anesthetic, loaded on a 10cc syringe with a 25G, 1.5-in  Needle. An appropriate amount of time was allowed for local anesthetics to take effect before proceeding to the next step. Local Anesthetic: Lidocaine 1-2% The unused portion of the local anesthetic was discarded in the proper designated containers. Safety Precautions: Aspiration looking for blood return was conducted prior to all injections. At no point did I inject any substances, as a needle was being advanced. Before injecting, the patient was told to immediately notify me if she was experiencing any new onset of "ringing in the ears, or metallic taste in the mouth". No attempts were made at seeking any paresthesias. Safe injection practices and needle disposal techniques used. Medications properly checked for expiration dates. SDV (single dose vial) medications used. After the completion of the procedure, all disposable equipment used was discarded in the proper designated medical waste containers.  Technical description: Protocol guidelines were followed. After positioning, the target area was identified and prepped in the usual manner. Skin & deeper tissues infiltrated with local anesthetic. Appropriate amount of time allowed to pass for local anesthetics to take effect. Proper needle placement secured. Once satisfactory needle placement was confirmed, I proceeded to inject the desired solution in slow, incremental fashion, intermittently  assessing for discomfort or any signs of abnormal or undesired spread of substance. Once completed, the needle was removed and disposed of, as per hospital protocols. The area was cleaned, making sure to leave some of the prepping solution back to take advantage of its long term bactericidal properties.  Aspiration:  Negative          There were no vitals filed for this visit.  End Time:   hrs   Imaging guidance  Imaging-assisted Technique: None required. Indication(s): N/A Exposure Time: N/A Contrast: None Fluoroscopic Guidance: N/A Ultrasound Guidance: N/A Interpretation: N/A   Post-op assessment  Post-procedure Vital Signs:  Pulse/HCG Rate:    Temp:   Resp:   BP:   SpO2:  EBL: None  Complications: No immediate post-treatment complications observed by team, or reported by patient.  Note: The patient tolerated the entire procedure well. A repeat set of vitals were taken after the procedure and the patient was kept under observation following institutional policy, for this type of procedure. Post-procedural neurological assessment was performed, showing return to baseline, prior to discharge. The patient was provided with post-procedure discharge instructions, including a section on how to identify potential problems. Should any problems arise concerning this procedure, the patient was given instructions to immediately contact us, at any time, without hesitation. In any case, we plan to contact the patient by telephone for a follow-up status report regarding this interventional procedure.  Comments:  No additional relevant information.   Plan of care  Chronic Opioid Analgesic:  Oxycodone IR 5 mg 1 tab PO 5X/day (#150/mo) (25 mg/day) MME/day: 37.5 mg/day.   Medications administered: Karen Lewis had no medications administered during this visit.  Follow-up plan:   No follow-ups on file.      Interventional Therapies  Risk  Complexity Considerations:   Estimated  body mass index is 60.59 kg/m as calculated from the following:   Height as of 06/18/21: '5\' 4"'$  (1.626 m).   Weight as of 06/18/21: 353 lb (160.1 kg). NOTE: NO RFA until BMI less or equal to 35.  (Gastric bypass done on 06/03/2017) Iodine allergy  Contrast dye allergy  Shellfish allergy    Planned  Pending:   Therapeutic bilateral IA Zilretta knee injection #3  Diagnostic bilateral x-rays of the knees  Lidoderm patch for low back and knee pain    Under consideration:   Diagnostic bilateral genicular NB    Completed:   Therapeutic bilateral IA Zilretta knee injection x2 (08/04/2021) (100/100/75/75)  Therapeutic bilateral IA Hyalgan knee injection x16 (06/24/2020)  Therapeutic bilateral THR (total hip replacements) (Dr. Rudene Christians) (Right: 11/27/2019) (Left: 07/29/2015)  Palliative/therapeutic bilateral lumbar facet MBB x7 (08/25/2021) (100/100/60/60)  Therapeutic right IA hip injection x3 (07/07/2017)  Therapeutic left IA shoulder (glenohumeral joint) injection x1 (05/31/2017)    Therapeutic  Palliative (PRN) options:   Palliative bilateral knee injections  Palliative bilateral lumbar facet MBBs      Recent Visits Date Type Provider Dept  01/12/22 Office Visit Milinda Pointer, MD [facility]  12/22/21 Procedure visit Milinda Pointer, MD [facility]  Showing recent visits within past 90 days and meeting all other requirements Future Appointments Date Type Provider Dept  03/25/22 Appointment Milinda Pointer, [facility]  04/05/22 Appointment Milinda Pointer, MD [facility]  Showing future appointments within next 90 days and meeting all other requirements   Disposition: Discharge home  Discharge (Date  Time): 03/25/2022;   hrs.   Primary Care Physician: Idelle Crouch, MD Location: [facility] Note by: Gaspar Cola, MD Date: 03/25/2022; Time: 4:19 PM  DISCLAIMER: Medicine is not an Armed forces logistics/support/administrative officer. It has no guarantees or warranties. The decision to proceed with this intervention was based on the information collected from the patient. Conclusions were drawn from the patient's questionnaire, interview, and examination. Because information was provided in large part by the patient, it cannot be guaranteed that it has not been purposely or unconsciously manipulated or altered. Every effort has been made to obtain as much accurate, relevant, available data as possible. Always take into account that the treatment will also be dependent on availability of resources and existing treatment guidelines, considered by other Pain Management Specialists as being  common knowledge and practice, at the time of the intervention. It is also important to point out that variation in procedural techniques and pharmacological choices are the acceptable norm. For Medico-Legal review purposes, the indications, contraindications, technique, and results of the these procedures should only be evaluated, judged and interpreted by a Board-Certified Interventional Pain Specialist with extensive familiarity and expertise in the same exact procedure and technique.

## 2022-03-23 DIAGNOSIS — M545 Low back pain, unspecified: Secondary | ICD-10-CM | POA: Diagnosis not present

## 2022-03-23 DIAGNOSIS — G43719 Chronic migraine without aura, intractable, without status migrainosus: Secondary | ICD-10-CM | POA: Diagnosis not present

## 2022-03-23 DIAGNOSIS — G479 Sleep disorder, unspecified: Secondary | ICD-10-CM | POA: Diagnosis not present

## 2022-03-23 DIAGNOSIS — M797 Fibromyalgia: Secondary | ICD-10-CM | POA: Diagnosis not present

## 2022-03-23 DIAGNOSIS — R519 Headache, unspecified: Secondary | ICD-10-CM | POA: Diagnosis not present

## 2022-03-23 DIAGNOSIS — R110 Nausea: Secondary | ICD-10-CM | POA: Diagnosis not present

## 2022-03-23 DIAGNOSIS — H53149 Visual discomfort, unspecified: Secondary | ICD-10-CM | POA: Diagnosis not present

## 2022-03-23 DIAGNOSIS — G8929 Other chronic pain: Secondary | ICD-10-CM | POA: Diagnosis not present

## 2022-03-25 ENCOUNTER — Encounter: Payer: Self-pay | Admitting: Pain Medicine

## 2022-03-25 ENCOUNTER — Ambulatory Visit: Payer: Medicare HMO | Admitting: Pain Medicine

## 2022-03-25 ENCOUNTER — Ambulatory Visit: Payer: Medicare HMO | Attending: Pain Medicine | Admitting: Pain Medicine

## 2022-03-25 ENCOUNTER — Ambulatory Visit
Admission: RE | Admit: 2022-03-25 | Discharge: 2022-03-25 | Disposition: A | Discharge status: Home / Self Care | Payer: Medicare HMO | Source: Ambulatory Visit | Attending: Pain Medicine | Admitting: Pain Medicine

## 2022-03-25 VITALS — BP 112/80 | HR 87 | Temp 97.2°F | Resp 18 | Ht 62.0 in | Wt 362.0 lb

## 2022-03-25 DIAGNOSIS — Z79899 Other long term (current) drug therapy: Secondary | ICD-10-CM | POA: Diagnosis present

## 2022-03-25 DIAGNOSIS — M25561 Pain in right knee: Secondary | ICD-10-CM

## 2022-03-25 DIAGNOSIS — M174 Other bilateral secondary osteoarthritis of knee: Secondary | ICD-10-CM | POA: Diagnosis not present

## 2022-03-25 DIAGNOSIS — G894 Chronic pain syndrome: Secondary | ICD-10-CM | POA: Diagnosis not present

## 2022-03-25 DIAGNOSIS — M545 Low back pain, unspecified: Secondary | ICD-10-CM | POA: Diagnosis not present

## 2022-03-25 DIAGNOSIS — Z79891 Long term (current) use of opiate analgesic: Secondary | ICD-10-CM

## 2022-03-25 DIAGNOSIS — M153 Secondary multiple arthritis: Secondary | ICD-10-CM | POA: Diagnosis present

## 2022-03-25 DIAGNOSIS — Z6841 Body Mass Index (BMI) 40.0 and over, adult: Secondary | ICD-10-CM

## 2022-03-25 DIAGNOSIS — G8929 Other chronic pain: Secondary | ICD-10-CM | POA: Diagnosis not present

## 2022-03-25 DIAGNOSIS — Z5189 Encounter for other specified aftercare: Secondary | ICD-10-CM

## 2022-03-25 DIAGNOSIS — M170 Bilateral primary osteoarthritis of knee: Secondary | ICD-10-CM | POA: Diagnosis not present

## 2022-03-25 DIAGNOSIS — Z888 Allergy status to other drugs, medicaments and biological substances status: Secondary | ICD-10-CM

## 2022-03-25 DIAGNOSIS — M25562 Pain in left knee: Secondary | ICD-10-CM | POA: Diagnosis not present

## 2022-03-25 MED ORDER — LIDOCAINE HCL 2 % IJ SOLN
20.0000 mL | Freq: Once | INTRAMUSCULAR | Status: AC
  Administered 2022-03-25: 400 mg
  Filled 2022-03-25: qty 20

## 2022-03-25 MED ORDER — OXYCODONE HCL 5 MG PO TABS: 5.0000 mg | ORAL_TABLET | Freq: Three times a day (TID) | ORAL | 0 refills | Status: DC | PRN

## 2022-03-25 MED ORDER — TRIAMCINOLONE ACETONIDE 32 MG IX SRER
32.0000 mg | Freq: Once | INTRA_ARTICULAR | Status: AC
  Administered 2022-03-25: 32 mg via INTRA_ARTICULAR
  Filled 2022-03-25: qty 5

## 2022-03-25 MED ORDER — PENTAFLUOROPROP-TETRAFLUOROETH EX AERO: INHALATION_SPRAY | Freq: Once | CUTANEOUS | Status: DC

## 2022-03-25 MED ORDER — ROPIVACAINE HCL 2 MG/ML IJ SOLN
5.0000 mL | Freq: Once | INTRAMUSCULAR | Status: AC
  Administered 2022-03-25: 5 mL via INTRA_ARTICULAR
  Filled 2022-03-25: qty 20

## 2022-03-25 MED ORDER — LIDOCAINE HCL (PF) 1 % IJ SOLN
5.0000 mL | Freq: Once | INTRAMUSCULAR | Status: AC
  Administered 2022-03-25: 5 mL
  Filled 2022-03-25: qty 5

## 2022-03-25 MED ORDER — MIDAZOLAM HCL 5 MG/5ML IJ SOLN
0.5000 mg | Freq: Once | INTRAMUSCULAR | Status: AC
  Administered 2022-03-25: 2 mg via INTRAVENOUS
  Filled 2022-03-25: qty 5

## 2022-03-25 NOTE — Patient Instructions (Signed)
____________________________________________________________________________________________  Post-Procedure Discharge Instructions  Instructions: Apply ice:  Purpose: This will minimize any swelling and discomfort after procedure.  When: Day of procedure, as soon as you get home. How: Fill a plastic sandwich bag with crushed ice. Cover it with a small towel and apply to injection site. How long: (15 min on, 15 min off) Apply for 15 minutes then remove x 15 minutes.  Repeat sequence on day of procedure, until you go to bed. Apply heat:  Purpose: To treat any soreness and discomfort from the procedure. When: Starting the next day after the procedure. How: Apply heat to procedure site starting the day following the procedure. How long: May continue to repeat daily, until discomfort goes away. Food intake: Start with clear liquids (like water) and advance to regular food, as tolerated.  Physical activities: Keep activities to a minimum for the first 8 hours after the procedure. After that, then as tolerated. Driving: If you have received any sedation, be responsible and do not drive. You are not allowed to drive for 24 hours after having sedation. Blood thinner: (Applies only to those taking blood thinners) You may restart your blood thinner 6 hours after your procedure. Insulin: (Applies only to Diabetic patients taking insulin) As soon as you can eat, you may resume your normal dosing schedule. Infection prevention: Keep procedure site clean and dry. Shower daily and clean area with soap and water. Post-procedure Pain Diary: Extremely important that this be done correctly and accurately. Recorded information will be used to determine the next step in treatment. For the purpose of accuracy, follow these rules: Evaluate only the area treated. Do not report or include pain from an untreated area. For the purpose of this evaluation, ignore all other areas of pain, except for the treated area. After  your procedure, avoid taking a long nap and attempting to complete the pain diary after you wake up. Instead, set your alarm clock to go off every hour, on the hour, for the initial 8 hours after the procedure. Document the duration of the numbing medicine, and the relief you are getting from it. Do not go to sleep and attempt to complete it later. It will not be accurate. If you received sedation, it is likely that you were given a medication that may cause amnesia. Because of this, completing the diary at a later time may cause the information to be inaccurate. This information is needed to plan your care. Follow-up appointment: Keep your post-procedure follow-up evaluation appointment after the procedure (usually 2 weeks for most procedures, 6 weeks for radiofrequencies). DO NOT FORGET to bring you pain diary with you.   Expect: (What should I expect to see with my procedure?) From numbing medicine (AKA: Local Anesthetics): Numbness or decrease in pain. You may also experience some weakness, which if present, could last for the duration of the local anesthetic. Onset: Full effect within 15 minutes of injected. Duration: It will depend on the type of local anesthetic used. On the average, 1 to 8 hours.  From steroids (Applies only if steroids were used): Decrease in swelling or inflammation. Once inflammation is improved, relief of the pain will follow. Onset of benefits: Depends on the amount of swelling present. The more swelling, the longer it will take for the benefits to be seen. In some cases, up to 10 days. Duration: Steroids will stay in the system x 2 weeks. Duration of benefits will depend on multiple posibilities including persistent irritating factors. Side-effects: If present, they  may typically last 2 weeks (the duration of the steroids). Frequent: Cramps (if they occur, drink Gatorade and take over-the-counter Magnesium 450-500 mg once to twice a day); water retention with temporary  weight gain; increases in blood sugar; decreased immune system response; increased appetite. Occasional: Facial flushing (red, warm cheeks); mood swings; menstrual changes. Uncommon: Long-term decrease or suppression of natural hormones; bone thinning. (These are more common with higher doses or more frequent use. This is why we prefer that our patients avoid having any injection therapies in other practices.)  Very Rare: Severe mood changes; psychosis; aseptic necrosis. From procedure: Some discomfort is to be expected once the numbing medicine wears off. This should be minimal if ice and heat are applied as instructed.  Call if: (When should I call?) You experience numbness and weakness that gets worse with time, as opposed to wearing off. New onset bowel or bladder incontinence. (Applies only to procedures done in the spine)  Emergency Numbers: Durning business hours (Monday - Thursday, 8:00 AM - 4:00 PM) (Friday, 9:00 AM - 12:00 Noon): [phone] After hours: [phone] NOTE: If you are having a problem and are unable connect with, or to talk to a provider, then go to your nearest urgent care or emergency department. If the problem is serious and urgent, please call 911. ____________________________________________________________________________________________  ____________________________________________________________________________________________  Pharmacy Shortages of Pain Medication   Introduction Shockingly as it may seem, .  "No U.[place] Supreme Court decision has ever interpreted the Constitution as guaranteeing a right to health care for all Americans." - https://huff.com/  "With respect to human rights, the [place] has no formally codified right to health, nor does it participate in a human rights treaty that specifies a right to health." - Scott J. Schweikart, JD, MBE  Situation By now, most of  our patients have had the experience of being told by their pharmacist that they do not have enough medication to cover their prescription. If you have not had this experience, just know that you soon will.  Problem There appears to be a shortage of these medications, either at the national level or locally. This is happening with all pharmacies. When there is not enough medication, patients are offered a partial fill and they are told that they will try to get the rest of the medicine for them at a later time. If they do not have enough for even a partial fill, the pharmacists are telling the patients to call us (the prescribing physicians) to request that we send another prescription to another pharmacy to get the medicine.   This reordering of a controlled substance creates documentation problems where additional paperwork needs to be created to explain why two prescriptions for the same period of time and the same medicine are being prescribed to the same patient. It also creates situations where the last appointment note does not accurately reflect when and what prescriptions were given to a patient. This leads to prescribing errors down the line, in subsequent follow-up visits.   [employer] of Pharmacy ([employer]) Research revealed that Surveyor, quantity .1806 (21 NCAC 46.1806) authorizes pharmacists to the transfer of prescriptions among pharmacies, and it sets forth procedural and recordkeeping requirements for doing so. However, this requires the pharmacist to complete the previously mentioned procedural paperwork to accomplish the transfer. As it turns out, it is much easier for them to have the prescribing physicians do the work.   Possible solutions 1. Have the [facility] Assembly  add a provision to the "STOP ACT" (the law that mandates how controlled substances are prescribed) where there is an exception to the electronic prescribing rule that states that in the event  there are shortages of medications the physicians are allowed to use written prescriptions as opposed to electronic ones. This would allow patients to take their prescriptions to a different pharmacy that may have enough medication available to fill the prescription. The problem is that currently there is a law that does not allow for written prescriptions, with the exception of instances where the electronic medical record is down due to technical issues.  2. Have [place] Congress ease the pressure on pharmaceutical companies, allowing them to produce enough quantities of the medication to adequately supply the population. 3. Have pharmacies keep enough stocks of these medications to cover their client base.  4. Have the [facility] Assembly add a provision to the "STOP ACT" where they ease the regulations surrounding the transfer of controlled substances between pharmacies, so as to simplify the transfer of supplies. As an alternative, develop a system to allow patients to obtain the remainder of their prescription at another one of their pharmacies or at an associate pharmacy.   How this shortage will affect you.  The one thing that is abundantly clear is that this is a pharmacy supply problem  and not a prescriber problem. The job of the prescriber is to evaluate and monitor the patients for the appropriate indications to the use of these medicines, the monitoring of their use and the prescribing of the appropriate dose and regimen. It is not the job of the prescriber to provide or dispense the actual medication. By law, this is the job of the pharmacies and pharmacists. It is certainly not the job of the prescriber to solve the supply problems.   Due to the above problems we are no longer taking patients to write for their pain medication. We will continue to evaluate for appropriate indications and we may provide recommendations regarding medication, dose, and schedule, as well as monitoring  recommendations, however, we will not be taking over the actual prescribing of these substances. On those patients where we are treating their chronic pain with interventional therapies, exceptions will be considered on a case by case basis. At this time, we will try to continue providing this supplemental service to those patients we have been managing in the past. However, as of August 1st, 2023, we no longer will be sending additional prescriptions to other pharmacies for the purpose of solving their supply problems. Once we send a prescription to a pharmacy, we will not be resending it again to another pharmacy to cover for their shortages.   What to do. Write as many letters as you can. Recruit the help of family members in writing these letters. Below are some of the places where you can write to make your voice heard. Let them know what the problem is and push them to look for solutions.   Search internet for: "[employer] find your legislators" NoseSwap.is  Search internet for: "[employer] commissioner complaints" Starlas.fi  Search internet for: "[place] complaints" https://www.hernandez-brewer.com/.htm  Search internet for: "[facility] pharmacy complaints" Email [facility] Customer Relations woondaal.com.jsp?callType=store  Search internet for: Programme researcher, broadcasting/film/video customer service complaints" https://www.[facility].com/topic/marketing/contactus/contactus_customerservice.jsp  ____________________________________________________________________________________________  ____________________________________________________________________________________________  Medication Rules  Purpose: To inform patients, and their family members, of our rules and regulations.  Applies to: All patients receiving prescriptions (written or  electronic).  Pharmacy of record: Pharmacy where electronic prescriptions will be sent. If written prescriptions are taken to a different pharmacy, please inform the nursing staff. The pharmacy listed in the electronic medical record should be the one where you would like electronic prescriptions to be sent.  Electronic prescriptions: In compliance with the [place] (STOP) Act of 2017 (Session Lanny Cramp [id number]), effective September 13, 2018, all controlled substances must be electronically prescribed. Calling prescriptions to the pharmacy will cease to exist.  Prescription refills: Only during scheduled appointments. Applies to all prescriptions.  NOTE: The following applies primarily to controlled substances (Opioid* Pain Medications).   Type of encounter (visit): For patients receiving controlled substances, face-to-face visits are required. (Not an option or up to the patient.)  Patient's responsibilities: Pain Pills: Bring all pain pills to every appointment (except for procedure appointments). Pill Bottles: Bring pills in original pharmacy bottle. Always bring the newest bottle. Bring bottle, even if empty. Medication refills: You are responsible for knowing and keeping track of what medications you take and those you need refilled. The day before your appointment: write a list of all prescriptions that need to be refilled. The day of the appointment: give the list to the admitting nurse. Prescriptions will be written only during appointments. No prescriptions will be written on procedure days. If you forget a medication: it will not be "Called in", "Faxed", or "electronically sent". You will need to get another appointment to get these prescribed. No early refills. Do not call asking to have your prescription filled early. Prescription Accuracy: You are responsible for carefully inspecting your prescriptions before leaving our office. Have the  discharge nurse carefully go over each prescription with you, before taking them home. Make sure that your name is accurately spelled, that your address is correct. Check the name and dose of your medication to make sure it is accurate. Check the number of pills, and the written instructions to make sure they are clear and accurate. Make sure that you are given enough medication to last until your next medication refill appointment. Taking Medication: Take medication as prescribed. When it comes to controlled substances, taking less pills or less frequently than prescribed is permitted and encouraged. Never take more pills than instructed. Never take medication more frequently than prescribed.  Inform other Doctors: Always inform, all of your healthcare providers, of all the medications you take. Pain Medication from other Providers: You are not allowed to accept any additional pain medication from any other Doctor or Healthcare provider. There are two exceptions to this rule. (see below) In the event that you require additional pain medication, you are responsible for notifying us, as stated below. Cough Medicine: Often these contain an opioid, such as codeine or hydrocodone. Never accept or take cough medicine containing these opioids if you are already taking an opioid* medication. The combination may cause respiratory failure and death. Medication Agreement: You are responsible for carefully reading and following our Medication Agreement. This must be signed before receiving any prescriptions from our practice. Safely store a copy of your signed Agreement. Violations to the Agreement will result in no further prescriptions. (Additional copies of our Medication Agreement are available upon request.) Laws, Rules, & Regulations: All patients are expected to follow all Federal and [employer], [employer], Rules, [employer] [facility]. Ignorance of the Laws does not constitute a valid excuse.  Illegal drugs and  Controlled Substances: The use of illegal substances (including, but not limited to marijuana and its derivatives) and/or  the illegal use of any controlled substances is strictly prohibited. Violation of this rule may result in the immediate and permanent discontinuation of any and all prescriptions being written by our practice. The use of any illegal substances is prohibited. Adopted CDC guidelines & recommendations: Target dosing levels will be at or below 60 MME/day. Use of benzodiazepines** is not recommended.  Exceptions: There are only two exceptions to the rule of not receiving pain medications from other Healthcare Providers. Exception #1 (Emergencies): In the event of an emergency (i.e.: accident requiring emergency care), you are allowed to receive additional pain medication. However, you are responsible for: As soon as you are able, call our office [phone], at any time of the day or night, and leave a message stating your name, the date and nature of the emergency, and the name and dose of the medication prescribed. In the event that your call is answered by a member of our staff, make sure to document and save the date, time, and the name of the person that took your information.  Exception #2 (Planned Surgery): In the event that you are scheduled by another doctor or dentist to have any type of surgery or procedure, you are allowed (for a period no longer than 30 days), to receive additional pain medication, for the acute post-op pain. However, in this case, you are responsible for picking up a copy of our "Post-op Pain Management for Surgeons" handout, and giving it to your surgeon or dentist. This document is available at our office, and does not require an appointment to obtain it. Simply go to our office during business hours (Monday-Thursday from 8:00 AM to 4:00 PM) (Friday 8:00 AM to 12:00 Noon) or if you have a scheduled appointment with [place], prior to your surgery, and ask for it by  name. In addition, you are responsible for: calling our office [phone], at any time of the day or night, and leaving a message stating your name, name of your surgeon, type of surgery, and date of procedure or surgery. Failure to comply with your responsibilities may result in termination of therapy involving the controlled substances. Medication Agreement Violation. Following the above rules, including your responsibilities will help you in avoiding a Medication Agreement Violation ("Breaking your Pain Medication Contract").  *Opioid medications include: morphine, codeine, oxycodone, oxymorphone, hydrocodone, hydromorphone, meperidine, tramadol, tapentadol, buprenorphine, fentanyl, methadone. **Benzodiazepine medications include: diazepam (Valium), alprazolam (Xanax), clonazepam (Klonopine), lorazepam (Ativan), clorazepate (Tranxene), chlordiazepoxide (Librium), estazolam (Prosom), oxazepam (Serax), temazepam (Restoril), triazolam (Halcion) (Last updated: 06/10/2021) ____________________________________________________________________________________________  ____________________________________________________________________________________________  Medication Recommendations and Reminders  Applies to: All patients receiving prescriptions (written and/or electronic).  Medication Rules & Regulations: These rules and regulations exist for your safety and that of others. They are not flexible and neither are we. Dismissing or ignoring them will be considered "non-compliance" with medication therapy, resulting in complete and irreversible termination of such therapy. (See document titled "Medication Rules" for more details.) In all conscience, because of safety reasons, we cannot continue providing a therapy where the patient does not follow instructions.  Pharmacy of record:  Definition: This is the pharmacy where your electronic prescriptions will be sent.  We do not endorse any particular  pharmacy, however, we have experienced problems with [facility] not securing enough medication supply for the community. We do not restrict you in your choice of pharmacy. However, once we write for your prescriptions, we will NOT be re-sending more prescriptions to fix restricted supply problems created by your pharmacy, or  your insurance.  The pharmacy listed in the electronic medical record should be the one where you want electronic prescriptions to be sent. If you choose to change pharmacy, simply notify our nursing staff.  Recommendations: Keep all of your pain medications in a safe place, under lock and key, even if you live alone. We will NOT replace lost, stolen, or damaged medication. After you fill your prescription, take 1 week's worth of pills and put them away in a safe place. You should keep a separate, properly labeled bottle for this purpose. The remainder should be kept in the original bottle. Use this as your primary supply, until it runs out. Once it's gone, then you know that you have 1 week's worth of medicine, and it is time to come in for a prescription refill. If you do this correctly, it is unlikely that you will ever run out of medicine. To make sure that the above recommendation works, it is very important that you make sure your medication refill appointments are scheduled at least 1 week before you run out of medicine. To do this in an effective manner, make sure that you do not leave the office without scheduling your next medication management appointment. Always ask the nursing staff to show you in your prescription , when your medication will be running out. Then arrange for the receptionist to get you a return appointment, at least 7 days before you run out of medicine. Do not wait until you have 1 or 2 pills left, to come in. This is very poor planning and does not take into consideration that we may need to cancel appointments due to bad weather, sickness, or emergencies  affecting our staff. DO NOT ACCEPT A "Partial Fill": If for any reason your pharmacy does not have enough pills/tablets to completely fill or refill your prescription, do not allow for a "partial fill". The law allows the pharmacy to complete that prescription within 72 hours, without requiring a new prescription. If they do not fill the rest of your prescription within those 72 hours, you will need a separate prescription to fill the remaining amount, which we will NOT provide. If the reason for the partial fill is your insurance, you will need to talk to the pharmacist about payment alternatives for the remaining tablets, but again, DO NOT ACCEPT A PARTIAL FILL, unless you can trust your pharmacist to obtain the remainder of the pills within 72 hours.  Prescription refills and/or changes in medication(s):  Prescription refills, and/or changes in dose or medication, will be conducted only during scheduled medication management appointments. (Applies to both, written and electronic prescriptions.) No refills on procedure days. No medication will be changed or started on procedure days. No changes, adjustments, and/or refills will be conducted on a procedure day. Doing so will interfere with the diagnostic portion of the procedure. No phone refills. No medications will be "called into the pharmacy". No Fax refills. No weekend refills. No Holliday refills. No after hours refills.  Remember:  Business hours are:  Monday to Thursday 8:00 AM to 4:00 PM Provider's Schedule: Milinda Pointer, MD - Appointments are:  Medication management: Monday and Wednesday 8:00 AM to 4:00 PM Procedure day: Tuesday and Thursday 7:30 AM to 4:00 PM Gillis Santa, MD - Appointments are:  Medication management: Tuesday and Thursday 8:00 AM to 4:00 PM Procedure day: Monday and Wednesday 7:30 AM to 4:00 PM (Last update:  04/02/2020) ____________________________________________________________________________________________  ____________________________________________________________________________________________  CBD (cannabidiol) & Delta-8 (Delta-8 tetrahydrocannabinol) WARNING  Intro: Cannabidiol (CBD) and tetrahydrocannabinol (THC), are two natural compounds found in plants of the Cannabis genus. They can both be extracted from hemp or cannabis. Hemp and cannabis come from the Cannabis sativa plant. Both compounds interact with your body's endocannabinoid system, but they have very different effects. CBD does not produce the high sensation associated with cannabis. Delta-8 tetrahydrocannabinol, also known as delta-8 THC, is a psychoactive substance found in the Cannabis sativa plant, of which marijuana and hemp are two varieties. THC is responsible for the high associated with the illicit use of marijuana.  Applicable to: All individuals currently taking or considering taking CBD (cannabidiol) and, more important, all patients taking opioid analgesic controlled substances (pain medication). (Example: oxycodone; oxymorphone; hydrocodone; hydromorphone; morphine; methadone; tramadol; tapentadol; fentanyl; buprenorphine; butorphanol; dextromethorphan; meperidine; codeine; etc.)  Legal status: CBD remains a Schedule I drug prohibited for any use. CBD is illegal with one exception. In the [place], CBD has a limited Transport planner (FDA) approval for the treatment of two specific types of epilepsy disorders. Only one CBD product has been approved by the FDA for this purpose: "Epidiolex". FDA is aware that some companies are marketing products containing cannabis and cannabis-derived compounds in ways that violate the [employer], Drug and Cosmetic Act [employer] [facility] [employer] [facility] Act) and that may put the health and safety of consumers at risk. The FDA, a Federal agency, has not enforced the CBD status since 2018. UPDATE:  (10/30/2021) The Drug Enforcement Agency ([place]) issued a letter stating that "delta" cannabinoids, including Delta-8-THCO and Delta-9-THCO, synthetically derived from hemp do not qualify as hemp and will be viewed as Schedule I drugs. (Schedule I drugs, substances, or chemicals are defined as drugs with no currently accepted medical use and a high potential for abuse. Some examples of Schedule I drugs are: heroin, lysergic acid diethylamide (LSD), marijuana (cannabis), 3,4-methylenedioxymethamphetamine (ecstasy), methaqualone, and peyote.) (https://jennings.com/)  Legality: Some manufacturers ship CBD products nationally, which is illegal. Often such products are sold online and are therefore available throughout the country. CBD is openly sold in head shops and health food stores in some states where such sales have not been explicitly legalized. Selling unapproved products with unsubstantiated therapeutic claims is not only a violation of the law, but also can put patients at risk, as these products have not been proven to be safe or effective. Federal illegality makes it difficult to conduct research on CBD.  Reference: "FDA Regulation of Cannabis and Cannabis-Derived Products, Including Cannabidiol (CBD)" - [employer].pt  Warning: CBD is not FDA approved and has not undergo the same manufacturing controls as prescription drugs.  This means that the purity and safety of available CBD may be questionable. Most of the time, despite manufacturer's claims, it is contaminated with THC (delta-9-tetrahydrocannabinol - the chemical in marijuana responsible for the "HIGH").  When this is the case, the [facility] contaminant will trigger a positive urine drug screen (UDS) test for Marijuana (carboxy-THC). Because a positive UDS for any illicit substance is a violation of our medication agreement, your opioid  analgesics (pain medicine) may be permanently discontinued. The FDA recently put out a warning about 5 things that everyone should be aware of regarding Delta-8 THC: Delta-8 THC products have not been evaluated or approved by the FDA for safe use and may be marketed in ways that put the public health at risk. The FDA has received adverse event reports involving delta-8 THC-containing products. Delta-8 THC has psychoactive and intoxicating effects. Delta-8 THC manufacturing often involve use of  potentially harmful chemicals to create the concentrations of delta-8 THC claimed in the marketplace. The final delta-8 THC product may have potentially harmful by-products (contaminants) due to the chemicals used in the process. Manufacturing of delta-8 THC products may occur in uncontrolled or unsanitary settings, which may lead to the presence of unsafe contaminants or other potentially harmful substances. Delta-8 THC products should be kept out of the reach of children and pets.  MORE ABOUT CBD  General Information: CBD was discovered in [birth year] and it is a derivative of the cannabis sativa genus plants (Marijuana and Hemp). It is one of the 113 identified substances found in Marijuana. It accounts for up to 40% of the plant's extract. As of 2018, preliminary clinical studies on CBD included research for the treatment of anxiety, movement disorders, and pain. CBD is available and consumed in multiple forms, including inhalation of smoke or vapor, as an aerosol spray, and by mouth. It may be supplied as an oil containing CBD, capsules, dried cannabis, or as a liquid solution. CBD is thought not to be as psychoactive as THC (delta-9-tetrahydrocannabinol - the chemical in marijuana responsible for the "HIGH"). Studies suggest that CBD may interact with different biological target receptors in the body, including cannabinoid and other neurotransmitter receptors. As of 2018 the mechanism of action for its biological  effects has not been determined.  Side-effects  Adverse reactions: Dry mouth, diarrhea, decreased appetite, fatigue, drowsiness, malaise, weakness, sleep disturbances, and others.  Drug interactions: CBC may interact with other medications such as blood-thinners. Because CBD causes drowsiness on its own, it also increases the drowsiness caused by other medications, including antihistamines (such as Benadryl), benzodiazepines (Xanax, Ativan, Valium), antipsychotics, antidepressants and opioids, as well as alcohol and supplements such as kava, melatonin and St. John's Wort. Be cautious with the following combinations:   Brivaracetam (Briviact) Brivaracetam is changed and broken down by the body. CBD might decrease how quickly the body breaks down brivaracetam. This might increase levels of brivaracetam in the body.  Caffeine Caffeine is changed and broken down by the body. CBD might decrease how quickly the body breaks down caffeine. This might increase levels of caffeine in the body.  Carbamazepine (Tegretol) Carbamazepine is changed and broken down by the body. CBD might decrease how quickly the body breaks down carbamazepine. This might increase levels of carbamazepine in the body and increase its side effects.  Citalopram (Celexa) Citalopram is changed and broken down by the body. CBD might decrease how quickly the body breaks down citalopram. This might increase levels of citalopram in the body and increase its side effects.  Clobazam (Onfi) Clobazam is changed and broken down by the liver. CBD might decrease how quickly the liver breaks down clobazam. This might increase the effects and side effects of clobazam.  Eslicarbazepine (Aptiom) Eslicarbazepine is changed and broken down by the body. CBD might decrease how quickly the body breaks down eslicarbazepine. This might increase levels of eslicarbazepine in the body by a small amount.  Everolimus (Zostress) Everolimus is changed and  broken down by the body. CBD might decrease how quickly the body breaks down everolimus. This might increase levels of everolimus in the body.  Lithium Taking higher doses of CBD might increase levels of lithium. This can increase the risk of lithium toxicity.  Medications changed by the liver (Cytochrome P450 1A1 (CYP1A1) substrates) Some medications are changed and broken down by the liver. CBD might change how quickly the liver breaks down these medications. This could change  the effects and side effects of these medications.  Medications changed by the liver (Cytochrome P450 1A2 (CYP1A2) substrates) Some medications are changed and broken down by the liver. CBD might change how quickly the liver breaks down these medications. This could change the effects and side effects of these medications.  Medications changed by the liver (Cytochrome P450 1B1 (CYP1B1) substrates) Some medications are changed and broken down by the liver. CBD might change how quickly the liver breaks down these medications. This could change the effects and side effects of these medications.  Medications changed by the liver (Cytochrome P450 2A6 (CYP2A6) substrates) Some medications are changed and broken down by the liver. CBD might change how quickly the liver breaks down these medications. This could change the effects and side effects of these medications.  Medications changed by the liver (Cytochrome P450 2B6 (CYP2B6) substrates) Some medications are changed and broken down by the liver. CBD might change how quickly the liver breaks down these medications. This could change the effects and side effects of these medications.  Medications changed by the liver (Cytochrome P450 2C19 (CYP2C19) substrates) Some medications are changed and broken down by the liver. CBD might change how quickly the liver breaks down these medications. This could change the effects and side effects of these medications.  Medications  changed by the liver (Cytochrome P450 2C8 (CYP2C8) substrates) Some medications are changed and broken down by the liver. CBD might change how quickly the liver breaks down these medications. This could change the effects and side effects of these medications.  Medications changed by the liver (Cytochrome P450 2C9 (CYP2C9) substrates) Some medications are changed and broken down by the liver. CBD might change how quickly the liver breaks down these medications. This could change the effects and side effects of these medications.  Medications changed by the liver (Cytochrome P450 2D6 (CYP2D6) substrates) Some medications are changed and broken down by the liver. CBD might change how quickly the liver breaks down these medications. This could change the effects and side effects of these medications.  Medications changed by the liver (Cytochrome P450 2E1 (CYP2E1) substrates) Some medications are changed and broken down by the liver. CBD might change how quickly the liver breaks down these medications. This could change the effects and side effects of these medications.  Medications changed by the liver (Cytochrome P450 3A4 (CYP3A4) substrates) Some medications are changed and broken down by the liver. CBD might change how quickly the liver breaks down these medications. This could change the effects and side effects of these medications.  Medications changed by the liver (Glucuronidated drugs) Some medications are changed and broken down by the liver. CBD might change how quickly the liver breaks down these medications. This could change the effects and side effects of these medications.  Medications that decrease the breakdown of other medications by the liver (Cytochrome P450 2C19 (CYP2C19) inhibitors) CBD is changed and broken down by the liver. Some drugs decrease how quickly the liver changes and breaks down CBD. This could change the effects and side effects of CBD.  Medications that decrease  the breakdown of other medications in the liver (Cytochrome P450 3A4 (CYP3A4) inhibitors) CBD is changed and broken down by the liver. Some drugs decrease how quickly the liver changes and breaks down CBD. This could change the effects and side effects of CBD.  Medications that increase breakdown of other medications by the liver (Cytochrome P450 3A4 (CYP3A4) inducers) CBD is changed and broken down by the  liver. Some drugs increase how quickly the liver changes and breaks down CBD. This could change the effects and side effects of CBD.  Medications that increase the breakdown of other medications by the liver (Cytochrome P450 2C19 (CYP2C19) inducers) CBD is changed and broken down by the liver. Some drugs increase how quickly the liver changes and breaks down CBD. This could change the effects and side effects of CBD.  Methadone (Dolophine) Methadone is broken down by the liver. CBD might decrease how quickly the liver breaks down methadone. Taking cannabidiol along with methadone might increase the effects and side effects of methadone.  Rufinamide (Banzel) Rufinamide is changed and broken down by the body. CBD might decrease how quickly the body breaks down rufinamide. This might increase levels of rufinamide in the body by a small amount.  Sedative medications (CNS depressants) CBD might cause sleepiness and slowed breathing. Some medications, called sedatives, can also cause sleepiness and slowed breathing. Taking CBD with sedative medications might cause breathing problems and/or too much sleepiness.  Sirolimus (Rapamune) Sirolimus is changed and broken down by the body. CBD might decrease how quickly the body breaks down sirolimus. This might increase levels of sirolimus in the body.  Stiripentol (Diacomit) Stiripentol is changed and broken down by the body. CBD might decrease how quickly the body breaks down stiripentol. This might increase levels of stiripentol in the body and increase  its side effects.  Tacrolimus (Prograf) Tacrolimus is changed and broken down by the body. CBD might decrease how quickly the body breaks down tacrolimus. This might increase levels of tacrolimus in the body.  Tamoxifen (Soltamox) Tamoxifen is changed and broken down by the body. CBD might affect how quickly the body breaks down tamoxifen. This might affect levels of tamoxifen in the body.  Topiramate (Topamax) Topiramate is changed and broken down by the body. CBD might decrease how quickly the body breaks down topiramate. This might increase levels of topiramate in the body by a small amount.  Valproate Valproic acid can cause liver injury. Taking cannabidiol with valproic acid might increase the chance of liver injury. CBD and/or valproic acid might need to be stopped, or the dose might need to be reduced.  Warfarin (Coumadin) CBD might increase levels of warfarin, which can increase the risk for bleeding. CBD and/or warfarin might need to be stopped, or the dose might need to be reduced.  Zonisamide Zonisamide is changed and broken down by the body. CBD might decrease how quickly the body breaks down zonisamide. This might increase levels of zonisamide in the body by a small amount. (Last update: 11/11/2021) ____________________________________________________________________________________________  ____________________________________________________________________________________________  Drug Holidays (Slow)  What is a "Drug Holiday"? Drug Holiday: is the name given to the period of time during which a patient stops taking a medication(s) for the purpose of eliminating tolerance to the drug.  Benefits Improved effectiveness of opioids. Decreased opioid dose needed to achieve benefits. Improved pain with lesser dose.  What is tolerance? Tolerance: is the progressive decreased in effectiveness of a drug due to its repetitive use. With repetitive use, the body gets use to the  medication and as a consequence, it loses its effectiveness. This is a common problem seen with opioid pain medications. As a result, a larger dose of the drug is needed to achieve the same effect that used to be obtained with a smaller dose.  How long should a "Drug Holiday" last? You should stay off of the pain medicine for at least 14 consecutive  days. (2 weeks)  Should I stop the medicine "cold [place]"? No. You should always coordinate with your Pain Specialist so that he/she can provide you with the correct medication dose to make the transition as smoothly as possible.  How do I stop the medicine? Slowly. You will be instructed to decrease the daily amount of pills that you take by one (1) pill every seven (7) days. This is called a "slow downward taper" of your dose. For example: if you normally take four (4) pills per day, you will be asked to drop this dose to three (3) pills per day for seven (7) days, then to two (2) pills per day for seven (7) days, then to one (1) per day for seven (7) days, and at the end of those last seven (7) days, this is when the "Drug Holiday" would start.   Will I have withdrawals? By doing a "slow downward taper" like this one, it is unlikely that you will experience any significant withdrawal symptoms. Typically, what triggers withdrawals is the sudden stop of a high dose opioid therapy. Withdrawals can usually be avoided by slowly decreasing the dose over a prolonged period of time. If you do not follow these instructions and decide to stop your medication abruptly, withdrawals may be possible.  What are withdrawals? Withdrawals: refers to the wide range of symptoms that occur after stopping or dramatically reducing opiate drugs after heavy and prolonged use. Withdrawal symptoms do not occur to patients that use low dose opioids, or those who take the medication sporadically. Contrary to benzodiazepine (example: Valium, Xanax, etc.) or alcohol withdrawals  ("Delirium Tremens"), opioid withdrawals are not lethal. Withdrawals are the physical manifestation of the body getting rid of the excess receptors.  Expected Symptoms Early symptoms of withdrawal may include: Agitation Anxiety Muscle aches Increased tearing Insomnia Runny nose Sweating Yawning  Late symptoms of withdrawal may include: Abdominal cramping Diarrhea Dilated pupils Goose bumps Nausea Vomiting  Will I experience withdrawals? Due to the slow nature of the taper, it is very unlikely that you will experience any.  What is a slow taper? Taper: refers to the gradual decrease in dose.  (Last update: 04/02/2020) ____________________________________________________________________________________________

## 2022-03-25 NOTE — Progress Notes (Signed)
Safety precautions to be maintained throughout the outpatient stay will include: orient to surroundings, keep bed in low position, maintain call bell within reach at all times, provide assistance with transfer out of bed and ambulation.  

## 2022-03-25 NOTE — Progress Notes (Signed)
Nursing Pain Medication Assessment:  Safety precautions to be maintained throughout the outpatient stay will include: orient to surroundings, keep bed in low position, maintain call bell within reach at all times, provide assistance with transfer out of bed and ambulation.  Medication Inspection Compliance: Pill count conducted under aseptic conditions, in front of the patient. Neither the pills nor the bottle was removed from the patient's sight at any time. Once count was completed pills were immediately returned to the patient in their original bottle.  Medication: Oxycodone IR  114 of 150 pills remain Pill/Patch Count:  Pill/Patch Appearance: Markings consistent with prescribed medication Bottle Appearance: Standard pharmacy container. Clearly labeled. Filled Date: 07 / 01 / 2023 Last Medication intake:  Today

## 2022-03-26 ENCOUNTER — Telehealth: Payer: Self-pay

## 2022-03-26 NOTE — Telephone Encounter (Signed)
Post procedure phone call.  Patient states she is doing well.  

## 2022-04-05 ENCOUNTER — Encounter: Payer: Medicare HMO | Admitting: Pain Medicine

## 2022-04-12 ENCOUNTER — Telehealth: Payer: Self-pay

## 2022-04-12 NOTE — Telephone Encounter (Signed)
Attempted to call patient for pre virtual appointment question.  Unable to leave message due to mailbox being full.

## 2022-04-12 NOTE — Progress Notes (Unsigned)
Patient: Karen Lewis  Service Category: E/M  Provider: Gaspar Cola, MD  DOB: 12/30/1970  DOS: 04/13/2022  Location: Office  MRN: [id number]  Setting: Ambulatory outpatient  Referring Provider: Idelle Crouch, MD  Type: Established Patient  Specialty: Interventional Pain Management  PCP: Idelle Crouch, MD  Location: Remote location  Delivery: TeleHealth     Virtual Encounter - Pain Management PROVIDER NOTE: Information contained herein reflects review and annotations entered in association with encounter. Interpretation of such information and data should be left to medically-trained personnel. Information provided to patient can be located elsewhere in the medical record under "Patient Instructions". Document created using STT-dictation technology, any transcriptional errors that may result from process are unintentional.    Contact & Pharmacy Preferred: [phone] Home: [phone] (home) Mobile: [phone] (mobile) E-mail: [email or web address], [place] - [address] [place] [address] Phone: [phone] Fax: [phone]   Pre-screening  Karen Lewis offered "in-person" vs "virtual" encounter. She indicated preferring virtual for this encounter.   Reason COVID-19*  Social distancing based on CDC and AMA recommendations.   I contacted Karen Lewis on 04/13/2022 via telephone.      I clearly identified myself as Gaspar Cola, MD. I verified that I was speaking with the correct person using two identifiers (Name: Karen Lewis, and date of birth: 08-Mar-1971).  Consent I sought verbal advanced consent from Karen Lewis for virtual visit interactions. I informed Karen Lewis of possible security and privacy concerns, risks, and limitations associated with providing "not-in-person" medical evaluation and management services. I also informed Karen Lewis of the availability of "in-person" appointments. Finally, I informed her  that there would be a charge for the virtual visit and that she could be  personally, fully or partially, financially responsible for it. Karen Lewis expressed understanding and agreed to proceed.   Historic Elements   Karen Lewis is a 51 y.o. year old, female patient evaluated today after our last contact on 03/25/2022. Karen Lewis  has a past medical history of Anemia, Anginal pain ([place]), Anxiety, Arthralgia of hip (07/29/2015), Arthritis, Arthritis, degenerative (07/29/2015), Asthma, Cephalalgia (07/25/2014), Dependence on unknown drug ([place]), Depression, Diabetes mellitus without complication ([place]), Difficult intubation, Dysrhythmia, Eczema, Fibromyalgia, Gastritis, GERD (gastroesophageal reflux disease), Gonalgia (07/29/2015), Gout, H/O cardiovascular disorder (03/10/2015), H/O surgical procedure (12/05/2012), H/O thyroid disease (03/10/2015), Headache, Herpes, History of artificial joint (07/29/2015), History of hiatal hernia, Hypertension, Hypomagnesemia, Hypothyroidism, IDA (iron deficiency anemia) (05/28/2019), LBP (low back pain) (07/29/2015), Neuromuscular disorder ([place]), Obesity, PCOS (polycystic ovarian syndrome), Primary osteoarthritis of both knees (07/29/2015), Sleep apnea, Thyroid nodule (bilateral), and Umbilical hernia. She also  has a past surgical history that includes Laparoscopic partial gastrectomy; Shoulder arthroscopy (Right); Carpal tunnel release (Bilateral); Diagnostic laparoscopy; Cholecystectomy; Trigger finger release (Right); Thyroidectomy (N/A, 11/12/2015); left trigger finger; Roux-en-Y Gastric Bypass (06/03/2017); Hiatal hernia repair; peniculectomy (N/A, 07/05/2018); Total hip arthroplasty (Right, 11/27/2019); Joint replacement (Bilateral, hip); Appendectomy; Trigger finger release (Right, 04/24/2020); Colonoscopy with propofol (N/A, 02/11/2021); and Esophagogastroduodenoscopy (egd) with propofol (N/A, 02/11/2021). Karen Lewis has a current medication list which includes the following  prescription(s): accu-chek aviva plus, acetaminophen, acidophilus, albuterol, amitriptyline, vitamin c, biotin, butalbital-acetaminophen-caffeine, calcium carbonate, clonazepam, b-12, diclofenac sodium, diphenhydramine, dupixent, epinephrine, ergocalciferol, famotidine, fluticasone furoate-vilanterol, furosemide, emgality, accu-chek aviva plus, levocetirizine, magnesium oxide, meclizine, metformin, metoprolol tartrate, montelukast, tab-a-vite, naphazoline-pheniramine, naproxen sodium, nitrofurantoin (macrocrystal-monohydrate), omeprazole, [START ON 04/28/2022] oxycodone, ozempic (0.25 or 0.5 mg/dose), quetiapine, rizatriptan, rizatriptan, rosuvastatin, sertraline, spironolactone,  tizanidine, topiramate, zolpidem, levothyroxine, lisinopril, and pregabalin. She  reports that she has never smoked. She has never used smokeless tobacco. She reports that she does not drink alcohol and does not use drugs. Karen Lewis is allergic to bactrim [sulfamethoxazole-trimethoprim], omalizumab, ciprofloxacin, shellfish allergy, and nsaids.   HPI  Today, she is being contacted for a post-procedure assessment.  The patient indicated having attained 100% relief of her knee pain, bilaterally, for the duration of the local anesthetic.  At this point, she is enjoying an ongoing 80% improvement in both of her knees, despite the fact that her right knee is bone-on-bone.  She would like to consider trying Monovisc next.  Currently, what is bothering her the most is her low back pain.  She does have a known history of bilateral lumbar facet pain and arthropathy, which we have documented on her most recent MRI.  However, what seems to bother her the most today is the pain on her left side as well as the weakness on hip flexion on that same left side.  Today we went over the results of her MRI and it would seem that she does have some mild central spinal stenosis and bilateral foraminal stenosis at the L2-3 level which would correspond to the  dermatomal distribution of the pain that she describes on her right thigh.  For this reason, I will be scheduling her for a right L2-3 LESI # 1 under fluoroscopic guidance.  She has requested sedation.  Post-procedure evaluation   Type: ER-steroid (Zilretta) Intra-articular Knee Injection Laterality: Bilateral (-50) No.: 4  Series: n/a Level/approach: Lateral Imaging guidance: Fluoroscopy-assisted ([id number]; [id number] ) Anesthesia: Local anesthesia (1-2% Lidocaine) Anxiolysis: IV Versed 2.0 mg Sedation: None. DOS: 03/25/2022  Performed by: Gaspar Cola, MD  Purpose: Therapeutic/Palliative Indications: Knee arthralgia associated to osteoarthritis of the knee 1. Chronic knee pain (1ry area of Pain) (Bilateral) (R>L)   2. Secondary Osteoarthritis of knee (Bilateral) (R>L)   3. Tricompartment osteoarthritis of knees (Bilateral)    Class 3 severe obesity due to excess calories with serious comorbidity and body mass index (BMI) of 60.0 to 69.9 in adult [facility])    Allergy to iodine    NAS-11 score:   Pre-procedure: 10-Worst pain ever/10   Post-procedure: 1 /10    Effectiveness:  Initial hour after procedure: 100 %. Subsequent 4-6 hours post-procedure: 100 %. Analgesia past initial 6 hours: 80 % (ongoing). Ongoing improvement:  Analgesic: Patient indicates having an ongoing 80% improvement in both of her knees.  She refers that as long as she is sitting she has absolutely no pain.  However, when she stands up she can tell that she is beginning to develop some instability especially on the right knee.  For the duration of the local anesthetic the patient indicated having attained 100% relief of her knee pain, bilaterally. Function: Karen Lewis reports improvement in function ROM: Karen Lewis reports improvement in ROM  Pharmacotherapy Assessment   Opioid Analgesic: Oxycodone IR 5 mg 1 tab PO 5X/day (#150/mo) (25 mg/day) MME/day: 37.5 mg/day.   Monitoring: [place] PMP: PDMP reviewed during  this encounter.       Pharmacotherapy: No side-effects or adverse reactions reported. Compliance: No problems identified. Effectiveness: Clinically acceptable. Plan: Refer to "POC". UDS:  Summary  Date Value Ref Range Status  10/26/2021 Note  Final    Comment:    ==================================================================== [employer] Select 13 (MW) ==================================================================== Test  Result       Flag       Units  Drug Present and Declared for Prescription Verification   7-aminoclonazepam              48           EXPECTED   ng/mg creat    7-aminoclonazepam is an expected metabolite of clonazepam. Source of    clonazepam is a scheduled prescription medication.    Noroxycodone                   518          EXPECTED   ng/mg creat    Noroxycodone is an expected metabolite of oxycodone. Sources of    oxycodone include scheduled prescription medications.  Drug Absent but Declared for Prescription Verification   Oxycodone                      Not Detected UNEXPECTED ng/mg creat    Oxycodone is almost always present in patients taking this drug    consistently.  Absence of oxycodone could be due to lapse of time    since the last dose or unusual pharmacokinetics (rapid metabolism).    Butalbital                     Not Detected UNEXPECTED ==================================================================== Test                      Result    Flag   Units      Ref Range   Creatinine              88               mg/dL      >=20 ==================================================================== Declared Medications:  The flagging and interpretation on this report are based on the  following declared medications.  Unexpected results may arise from  inaccuracies in the declared medications.   **Note: The testing scope of this panel includes these medications:   Butalbital (Fioricet)  Clonazepam (Klonopin)   Oxycodone   **Note: The testing scope of this panel does not include the  following reported medications:   Acetaminophen (Tylenol)  Acetaminophen (Fioricet)  Albuterol (Ventolin HFA)  Amitriptyline (Elavil)  Benzonatate (Tessalon)  Biotin  Caffeine (Fioricet)  Calcium (Tums)  Diclofenac (Voltaren)  Diphenhydramine (Benadryl)  Dupilumab (Dupixent)  Epinephrine  Eye Drop  Famotidine (Pepcid)  Fluticasone (Breo)  Furosemide (Lasix)  Guaifenesin (Robitussin)  Levocetirizine (Xyzal)  Levothyroxine (Synthroid)  Lisinopril (Zestril)  Magnesium (Mag-Ox)  Meclizine (Antivert)  Metformin (Glucophage)  Metoprolol (Lopressor)  Montelukast (Singulair)  Multivitamin  Naloxone (Narcan)  Naproxen (Aleve)  Nitrofurantoin (Macrobid)  Omeprazole (Prilosec)  Pregabalin (Lyrica)  Probiotic  Quetiapine (Seroquel)  Rizatriptan (Maxalt)  Rosuvastatin (Crestor)  Semaglutide (Ozempic)  Spironolactone (Aldactone)  Tizanidine (Zanaflex)  Vilanterol (Breo)  Vitamin B12  Vitamin C  Vitamin D2  Zolpidem (Ambien) ==================================================================== For clinical consultation, please call [phone]. ====================================================================    No results found for: "CBDTHCR", "D8THCCBX", "D9THCCBX"   Laboratory Chemistry Profile   Renal Lab Results  Component Value Date   BUN 19 04/28/2021   CREATININE 1.06 (H) 04/28/2021   GFRAA >60 04/16/2020   GFRNONAA >60 04/28/2021    Hepatic Lab Results  Component Value Date   AST 34 04/22/2021   ALT 34 04/22/2021   ALBUMIN 4.1 04/22/2021   ALKPHOS 63 04/22/2021    Electrolytes Lab Results  Component Value Date   NA 134 (L) 04/28/2021   K 4.5 04/28/2021   CL 101 04/28/2021   CALCIUM 8.4 (L) 04/28/2021   MG 2.1 04/28/2021   PHOS 5.5 (H) 05/22/2019    Bone Lab Results  Component Value Date   VD25OH 27.4 (L) 11/24/2015   VD125OH2TOT 41.5 11/24/2015     Inflammation (CRP: Acute Phase) (ESR: Chronic Phase) Lab Results  Component Value Date   CRP 0.7 09/27/2019   ESRSEDRATE 30 (H) 09/27/2019         Note: Above Lab results reviewed.  Imaging  DG PAIN CLINIC C-ARM 1-60 MIN NO REPORT Fluoro was used, but no Radiologist interpretation will be provided.  Please refer to "NOTES" tab for provider progress note.  Assessment  The primary encounter diagnosis was Chronic knee pain (1ry area of Pain) (Bilateral) (R>L). Diagnoses of Secondary Osteoarthritis of knee (Bilateral) (R>L), Tricompartment osteoarthritis of knees (Bilateral), Class 3 severe obesity due to excess calories with serious comorbidity and body mass index (BMI) of 60.0 to 69.9 in adult [facility]), Chronic low back pain (3ry area of Pain) (Bilateral) (R>L) w/o sciatica, Secondary osteoarthritis of multiple sites, Chronic pain syndrome, Lumbosacral radiculopathy at L2 (Right), Abnormal MRI, lumbar spine (11/10/2021), Chronic thigh pain (Right), Weakness of leg (Right), and DDD (degenerative disc disease), lumbosacral were also pertinent to this visit.  Plan of Care  Problem-specific:  No problem-specific Assessment & Plan notes found for this encounter.  Karen Lewis has a current medication list which includes the following long-term medication(s): albuterol, clonazepam, diphenhydramine, furosemide, magnesium oxide, metformin, metoprolol tartrate, montelukast, [START ON 04/28/2022] oxycodone, rizatriptan, rizatriptan, spironolactone, levothyroxine, lisinopril, and pregabalin.  Pharmacotherapy (Medications Ordered): No orders of the defined types were placed in this encounter.  Orders:  Orders Placed This Encounter  Procedures   Lumbar Epidural Injection    Standing Status:   Future    Standing Expiration Date:   07/14/2022    Scheduling Instructions:     Procedure: Interlaminar Lumbar Epidural Steroid injection (LESI)  L2-3     Laterality: Right-sided     Sedation: With  Sedation.     Timeframe: ASAA    Order Specific Question:   Where will this procedure be performed?    Answer:   [facility]   Follow-up plan:   Return for procedure (ECT): (R) L2-3 LESI #1.     Interventional Therapies  Risk  Complexity Considerations:   Estimated body mass index is 60.59 kg/m as calculated from the following:   Height as of 06/18/21: _0  (1.626 m).   Weight as of 06/18/21: 353 lb (160.1 kg). NOTE: NO RFA until BMI less or equal to 35.  (Gastric bypass done on 06/03/2017) Iodine allergy  Contrast dye allergy  Shellfish allergy    Planned  Pending:   Diagnostic right L2-3 LESI #1    Under consideration:   Therapeutic bilateral IA Monovisc knee inj. #1 (after treatment of low back and right leg pain/weakness) Diagnostic bilateral genicular NB    Completed:   Therapeutic bilateral IA Zilretta knee injection x4 (03/25/2022) (100/100/80/80)  Therapeutic bilateral IA Hyalgan knee injection x16 (06/24/2020) (100/100/60/60)  Palliative/therapeutic bilateral lumbar facet MBB x8 (08/25/2021) (100/100/60/60)  Therapeutic right IA hip injection x3 (07/07/2017)  Therapeutic left IA shoulder (glenohumeral joint) injection x1 (05/31/2017)    Completed by other providers:   Therapeutic bilateral THR (total hip replacements) (Dr. Rudene Christians) (Right: 11/27/2019) (Left: 07/29/2015)    Therapeutic  Palliative (PRN) options:  Palliative bilateral knee injections  Palliative bilateral lumbar facet MBBs     Recent Visits Date Type Provider Dept  03/25/22 Procedure visit Milinda Pointer, MD [facility]  Showing recent visits within past 90 days and meeting all other requirements Today's Visits Date Type Provider Dept  04/13/22 Office Visit Milinda Pointer, MD [facility]  Showing today's visits and meeting all other requirements Future Appointments Date Type Provider Dept  05/24/22 Appointment Milinda Pointer, MD [facility]   Showing future appointments within next 90 days and meeting all other requirements  I discussed the assessment and treatment plan with the patient. The patient was provided an opportunity to ask questions and all were answered. The patient agreed with the plan and demonstrated an understanding of the instructions.  Patient advised to call back or seek an in-person evaluation if the symptoms or condition worsens.  Duration of encounter: 28 minutes.  Note by: Gaspar Cola, MD Date: 04/13/2022; Time: 6:07 PM

## 2022-04-13 ENCOUNTER — Ambulatory Visit: Payer: Medicare HMO | Attending: Pain Medicine | Admitting: Pain Medicine

## 2022-04-13 DIAGNOSIS — Z6841 Body Mass Index (BMI) 40.0 and over, adult: Secondary | ICD-10-CM

## 2022-04-13 DIAGNOSIS — R937 Abnormal findings on diagnostic imaging of other parts of musculoskeletal system: Secondary | ICD-10-CM | POA: Insufficient documentation

## 2022-04-13 DIAGNOSIS — M545 Low back pain, unspecified: Secondary | ICD-10-CM

## 2022-04-13 DIAGNOSIS — R29898 Other symptoms and signs involving the musculoskeletal system: Secondary | ICD-10-CM

## 2022-04-13 DIAGNOSIS — G894 Chronic pain syndrome: Secondary | ICD-10-CM

## 2022-04-13 DIAGNOSIS — M153 Secondary multiple arthritis: Secondary | ICD-10-CM

## 2022-04-13 DIAGNOSIS — M5417 Radiculopathy, lumbosacral region: Secondary | ICD-10-CM | POA: Diagnosis not present

## 2022-04-13 DIAGNOSIS — M170 Bilateral primary osteoarthritis of knee: Secondary | ICD-10-CM | POA: Diagnosis not present

## 2022-04-13 DIAGNOSIS — M174 Other bilateral secondary osteoarthritis of knee: Secondary | ICD-10-CM | POA: Diagnosis not present

## 2022-04-13 DIAGNOSIS — G8929 Other chronic pain: Secondary | ICD-10-CM

## 2022-04-13 DIAGNOSIS — M25561 Pain in right knee: Secondary | ICD-10-CM

## 2022-04-13 DIAGNOSIS — M5137 Other intervertebral disc degeneration, lumbosacral region: Secondary | ICD-10-CM

## 2022-04-13 DIAGNOSIS — M79651 Pain in right thigh: Secondary | ICD-10-CM

## 2022-04-13 DIAGNOSIS — M25562 Pain in left knee: Secondary | ICD-10-CM

## 2022-04-13 NOTE — Patient Instructions (Signed)
______________________________________________________________________  Preparing for Procedure with Sedation  NOTICE: Due to recent regulatory changes, starting on April 13, 2021, procedures requiring intravenous (IV) sedation will no longer be performed at the [facility].  These types of procedures are required to be performed at [facility] ambulatory surgery facility.  We are very sorry for the inconvenience.  Procedure appointments are limited to planned procedures: No Prescription Refills. No disability issues will be discussed. No medication changes will be discussed.  Instructions: Oral Intake: Do not eat or drink anything for at least 8 hours prior to your procedure. (Exception: Blood Pressure Medication. See below.) Transportation: A driver is required. You may not drive yourself after the procedure. Blood Pressure Medicine: Do not forget to take your blood pressure medicine with a sip of water the morning of the procedure. If your Diastolic (lower reading) is above 100 mmHg, elective cases will be cancelled/rescheduled. Blood thinners: These will need to be stopped for procedures. Notify our staff if you are taking any blood thinners. Depending on which one you take, there will be specific instructions on how and when to stop it. Diabetics on insulin: Notify the staff so that you can be scheduled 1st case in the morning. If your diabetes requires high dose insulin, take only  of your normal insulin dose the morning of the procedure and notify the staff that you have done so. Preventing infections: Shower with an antibacterial soap the morning of your procedure. Build-up your immune system: Take 1000 mg of Vitamin C with every meal (3 times a day) the day prior to your procedure. Antibiotics: Inform the staff if you have a condition or reason that requires you to take antibiotics before dental procedures. Pregnancy: If you are pregnant, call and cancel the procedure. Sickness: If  you have a cold, fever, or any active infections, call and cancel the procedure. Arrival: You must be in the facility at least 30 minutes prior to your scheduled procedure. Children: Do not bring children with you. Dress appropriately: There is always the possibility that your clothing may get soiled. Valuables: Do not bring any jewelry or valuables.  Reasons to call and reschedule or cancel your procedure: (Following these recommendations will minimize the risk of a serious complication.) Surgeries: Avoid having procedures within 2 weeks of any surgery. (Avoid for 2 weeks before or after any surgery). Flu Shots: Avoid having procedures within 2 weeks of a flu shots. (Avoid for 2 weeks before or after immunizations). Barium: Avoid having a procedure within 7-10 days after having had a radiological study involving the use of radiological contrast. (Myelograms, Barium swallow or enema study). Heart attacks: Avoid any elective procedures or surgeries for the initial 6 months after a "Myocardial Infarction" (Heart Attack). Blood thinners: It is imperative that you stop these medications before procedures. Let us know if you if you take any blood thinner.  Infection: Avoid procedures during or within two weeks of an infection (including chest colds or gastrointestinal problems). Symptoms associated with infections include: Localized redness, fever, chills, night sweats or profuse sweating, burning sensation when voiding, cough, congestion, stuffiness, runny nose, sore throat, diarrhea, nausea, vomiting, cold or Flu symptoms, recent or current infections. It is specially important if the infection is over the area that we intend to treat. Heart and lung problems: Symptoms that may suggest an active cardiopulmonary problem include: cough, chest pain, breathing difficulties or shortness of breath, dizziness, ankle swelling, uncontrolled high or unusually low blood pressure, and/or palpitations. If you are    experiencing any of these symptoms, cancel your procedure and contact your primary care physician for an evaluation.  Remember:  Regular Business hours are:  Monday to Thursday 8:00 AM to 4:00 PM  Provider's Schedule: Stephanye Finnicum, MD:  Procedure days: Tuesday and Thursday 7:30 AM to 4:00 PM  Bilal Lateef, MD:  Procedure days: Monday and Wednesday 7:30 AM to 4:00 PM ______________________________________________________________________  ____________________________________________________________________________________________  General Risks and Possible Complications  Patient Responsibilities: It is important that you read this as it is part of your informed consent. It is our duty to inform you of the risks and possible complications associated with treatments offered to you. It is your responsibility as a patient to read this and to ask questions about anything that is not clear or that you believe was not covered in this document.  Patient's Rights: You have the right to refuse treatment. You also have the right to change your mind, even after initially having agreed to have the treatment done. However, under this last option, if you wait until the last second to change your mind, you may be charged for the materials used up to that point.  Introduction: Medicine is not an exact science. Everything in Medicine, including the lack of treatment(s), carries the potential for danger, harm, or loss (which is by definition: Risk). In Medicine, a complication is a secondary problem, condition, or disease that can aggravate an already existing one. All treatments carry the risk of possible complications. The fact that a side effects or complications occurs, does not imply that the treatment was conducted incorrectly. It must be clearly understood that these can happen even when everything is done following the highest safety standards.  No treatment: You can choose not to proceed with the  proposed treatment alternative. The "PRO(s)" would include: avoiding the risk of complications associated with the therapy. The "CON(s)" would include: not getting any of the treatment benefits. These benefits fall under one of three categories: diagnostic; therapeutic; and/or palliative. Diagnostic benefits include: getting information which can ultimately lead to improvement of the disease or symptom(s). Therapeutic benefits are those associated with the successful treatment of the disease. Finally, palliative benefits are those related to the decrease of the primary symptoms, without necessarily curing the condition (example: decreasing the pain from a flare-up of a chronic condition, such as incurable terminal cancer).  General Risks and Complications: These are associated to most interventional treatments. They can occur alone, or in combination. They fall under one of the following six (6) categories: no benefit or worsening of symptoms; bleeding; infection; nerve damage; allergic reactions; and/or death. No benefits or worsening of symptoms: In Medicine there are no guarantees, only probabilities. No healthcare provider can ever guarantee that a medical treatment will work, they can only state the probability that it may. Furthermore, there is always the possibility that the condition may worsen, either directly, or indirectly, as a consequence of the treatment. Bleeding: This is more common if the patient is taking a blood thinner, either prescription or over the counter (example: Goody Powders, Fish oil, Aspirin, Garlic, etc.), or if suffering a condition associated with impaired coagulation (example: Hemophilia, cirrhosis of the liver, low platelet counts, etc.). However, even if you do not have one on these, it can still happen. If you have any of these conditions, or take one of these drugs, make sure to notify your treating physician. Infection: This is more common in patients with a compromised  immune system, either due to disease (example:   diabetes, cancer, human immunodeficiency virus [HIV], etc.), or due to medications or treatments (example: therapies used to treat cancer and rheumatological diseases). However, even if you do not have one on these, it can still happen. If you have any of these conditions, or take one of these drugs, make sure to notify your treating physician. Nerve Damage: This is more common when the treatment is an invasive one, but it can also happen with the use of medications, such as those used in the treatment of cancer. The damage can occur to small secondary nerves, or to large primary ones, such as those in the spinal cord and brain. This damage may be temporary or permanent and it may lead to impairments that can range from temporary numbness to permanent paralysis and/or brain death. Allergic Reactions: Any time a substance or material comes in contact with our body, there is the possibility of an allergic reaction. These can range from a mild skin rash (contact dermatitis) to a severe systemic reaction (anaphylactic reaction), which can result in death. Death: In general, any medical intervention can result in death, most of the time due to an unforeseen complication. ____________________________________________________________________________________________  

## 2022-04-14 ENCOUNTER — Other Ambulatory Visit: Payer: Self-pay

## 2022-04-14 DIAGNOSIS — F419 Anxiety disorder, unspecified: Secondary | ICD-10-CM | POA: Diagnosis not present

## 2022-04-14 DIAGNOSIS — F4312 Post-traumatic stress disorder, chronic: Secondary | ICD-10-CM | POA: Diagnosis not present

## 2022-04-14 DIAGNOSIS — F331 Major depressive disorder, recurrent, moderate: Secondary | ICD-10-CM | POA: Diagnosis not present

## 2022-04-14 NOTE — Patient Outreach (Signed)
[facility]) Care Management  04/14/2022  Karen Lewis December 11, 1970 [id number]   Patient doing well and meeting goals. Discussed case closure.  Patient agreeable.   Care Plan : RN Care Manager Plan of Care  Updates made by Jon Billings, RN since 04/14/2022 12:00 AM  Completed 04/14/2022   Problem: Chronic Disease Management and Care Coordination needs for diabetes Resolved 04/14/2022  Priority: High     Long-Range Goal: Development of plan of care for management of diabetes Completed 04/14/2022  Start Date: 11/03/2021  Expected End Date: 09/12/2022  This Visit's Progress: On track  Recent Progress: On track  Priority: High  Note:   Current Barriers:  Chronic Disease Management support and education needs related to DMII   RNCM Clinical Goal(s):  Patient will verbalize basic understanding of  DMII disease process and self health management plan as evidenced by A1c less than 7.0 continue to work with RN Care Manager to address care management and care coordination needs related to  DMII as evidenced by adherence to CM Team Scheduled appointments through collaboration with RN Care manager, provider, and care team.   Interventions: Education and support related to diabetes Inter-disciplinary care team collaboration (see longitudinal plan of care) Evaluation of current treatment plan related to  self management and patient's adherence to plan as established by provider   Diabetes Interventions:  (Status:  New goal.) Long Term Goal Assessed patient's understanding of A1c goal: <7% Provided education to patient about basic DM disease process Discussed plans with patient for ongoing care management follow up and provided patient with direct contact information for care management team Review of patient status, including review of consultants reports, relevant laboratory and other test results, and medications completed Lab Results  Component Value Date   HGBA1C 7.1 (H)  11/27/2019  Last A1c reported 07/12/21- 6.5  01/20/22 A1c 6.9 Patient doing well.  Blood sugars around 100-120.  Discussed diet management of diabetes.  No concerns.     Patient Goals/Self-Care Activities: Diabetes Take all medications as prescribed Attend all scheduled provider appointments check blood sugar at prescribed times: once daily enter blood sugar readings and medication or insulin into daily log take the blood sugar log to all doctor visits limit fast food meals to no more than 1 per week  Follow Up Plan:  Patient meeting goals. Will close case.    Plan: Closing case.    Jone Baseman, RN, MSN [facility] Care Management Care Management Coordinator Direct Line [phone] Toll Free: [phone]  Fax: [phone]

## 2022-04-15 ENCOUNTER — Telehealth: Payer: Medicare HMO | Admitting: Pain Medicine

## 2022-04-21 DIAGNOSIS — Z79899 Other long term (current) drug therapy: Secondary | ICD-10-CM | POA: Diagnosis not present

## 2022-04-21 DIAGNOSIS — E785 Hyperlipidemia, unspecified: Secondary | ICD-10-CM | POA: Diagnosis not present

## 2022-04-21 DIAGNOSIS — Z9884 Bariatric surgery status: Secondary | ICD-10-CM | POA: Diagnosis not present

## 2022-04-21 DIAGNOSIS — Z6841 Body Mass Index (BMI) 40.0 and over, adult: Secondary | ICD-10-CM | POA: Diagnosis not present

## 2022-04-21 DIAGNOSIS — E890 Postprocedural hypothyroidism: Secondary | ICD-10-CM | POA: Diagnosis not present

## 2022-04-21 DIAGNOSIS — E1169 Type 2 diabetes mellitus with other specified complication: Secondary | ICD-10-CM | POA: Diagnosis not present

## 2022-04-21 DIAGNOSIS — I10 Essential (primary) hypertension: Secondary | ICD-10-CM | POA: Diagnosis not present

## 2022-04-21 DIAGNOSIS — M549 Dorsalgia, unspecified: Secondary | ICD-10-CM | POA: Diagnosis not present

## 2022-04-25 NOTE — Progress Notes (Unsigned)
PROVIDER NOTE: Interpretation of information contained herein should be left to medically-trained personnel. Specific patient instructions are provided elsewhere under "Patient Instructions" section of medical record. This document was created in part using STT-dictation technology, any transcriptional errors that may result from this process are unintentional.  Patient: Karen Lewis Type: Established DOB: February 21, 1971 MRN: [id number] PCP: Idelle Crouch, MD  Service: Procedure DOS: 04/29/2022 Setting: Ambulatory Location: Ambulatory outpatient facility Delivery: Face-to-face Provider: Gaspar Cola, MD Specialty: Interventional Pain Management Specialty designation: 09 Location: Outpatient facility Ref. Prov.: Milinda Pointer, MD    Primary Reason for Visit: Interventional Pain Management Treatment. CC: No chief complaint on file.   Procedure:           Type: Lumbar epidural steroid injection (LESI) (interlaminar) #1    Laterality: Right   Level:  L2-3 Level.  Imaging: Fluoroscopic guidance         Anesthesia: Local anesthesia (1-2% Lidocaine) Anxiolysis: None                 Sedation:                .  DOS: 04/29/2022  Performed by: Gaspar Cola, MD  Purpose: Diagnostic/Therapeutic Indications: Lumbar radicular pain of intraspinal etiology of more than 4 weeks that has failed to respond to conservative therapy and is severe enough to impact quality of life or function. No diagnosis found. NAS-11 Pain score:   Pre-procedure:  /10   Post-procedure:  /10      Position / Prep / Materials:  Position: Prone w/ head of the table raised (slight reverse trendelenburg) to facilitate breathing.  Prep solution: DuraPrep (Iodine Povacrylex [0.7% available iodine] and Isopropyl Alcohol, 74% w/w) Prep Area: Entire Posterior Lumbar Region from lower scapular tip down to mid buttocks area and from flank to flank. Materials:  Tray: Epidural tray Needle(s):  Type: Epidural  needle (Tuohy) Gauge (G):  17 Length: Regular (3.5-in) Qty: 1  Pre-op H&P Assessment:  Karen Lewis is a 51 y.o. (year old), female patient, seen today for interventional treatment. She  has a past surgical history that includes Laparoscopic partial gastrectomy; Shoulder arthroscopy (Right); Carpal tunnel release (Bilateral); Diagnostic laparoscopy; Cholecystectomy; Trigger finger release (Right); Thyroidectomy (N/A, 11/12/2015); left trigger finger; Roux-en-Y Gastric Bypass (06/03/2017); Hiatal hernia repair; peniculectomy (N/A, 07/05/2018); Total hip arthroplasty (Right, 11/27/2019); Joint replacement (Bilateral, hip); Appendectomy; Trigger finger release (Right, 04/24/2020); Colonoscopy with propofol (N/A, 02/11/2021); and Esophagogastroduodenoscopy (egd) with propofol (N/A, 02/11/2021). Karen Lewis has a current medication list which includes the following prescription(s): accu-chek aviva plus, acetaminophen, acidophilus, albuterol, amitriptyline, vitamin c, biotin, butalbital-acetaminophen-caffeine, calcium carbonate, clonazepam, b-12, diclofenac sodium, diphenhydramine, dupixent, epinephrine, ergocalciferol, famotidine, fluticasone furoate-vilanterol, furosemide, emgality, accu-chek aviva plus, levocetirizine, levothyroxine, lisinopril, magnesium oxide, meclizine, metformin, metoprolol tartrate, montelukast, tab-a-vite, naphazoline-pheniramine, naproxen sodium, nitrofurantoin (macrocrystal-monohydrate), omeprazole, [START ON 04/28/2022] oxycodone, ozempic (0.25 or 0.5 mg/dose), pregabalin, quetiapine, rizatriptan, rizatriptan, rosuvastatin, sertraline, spironolactone, tizanidine, topiramate, and zolpidem. Her primarily concern today is the No chief complaint on file.  Initial Vital Signs:  Pulse/HCG Rate:    Temp:   Resp:   BP:   SpO2:    BMI: Estimated body mass index is 66.21 kg/m as calculated from the following:   Height as of 03/25/22: '5\' 2"'$  (1.575 m).   Weight as of 03/25/22: 362 lb (164.2 kg).  Risk  Assessment: Allergies: Reviewed. She is allergic to bactrim [sulfamethoxazole-trimethoprim], omalizumab, ciprofloxacin, shellfish allergy, and nsaids.  Allergy Precautions: None required Coagulopathies: Reviewed. None identified.  Blood-thinner therapy:  None at this time Active Infection(s): Reviewed. None identified. Ms. Mago is afebrile  Site Confirmation: Karen Lewis was asked to confirm the procedure and laterality before marking the site Procedure checklist: Completed Consent: Before the procedure and under the influence of no sedative(s), amnesic(s), or anxiolytics, the patient was informed of the treatment options, risks and possible complications. To fulfill our ethical and legal obligations, as recommended by the American Medical Association's Code of Ethics, I have informed the patient of my clinical impression; the nature and purpose of the treatment or procedure; the risks, benefits, and possible complications of the intervention; the alternatives, including doing nothing; the risk(s) and benefit(s) of the alternative treatment(s) or procedure(s); and the risk(s) and benefit(s) of doing nothing. The patient was provided information about the general risks and possible complications associated with the procedure. These may include, but are not limited to: failure to achieve desired goals, infection, bleeding, organ or nerve damage, allergic reactions, paralysis, and death. In addition, the patient was informed of those risks and complications associated to Spine-related procedures, such as failure to decrease pain; infection (i.e.: Meningitis, epidural or intraspinal abscess); bleeding (i.e.: epidural hematoma, subarachnoid hemorrhage, or any other type of intraspinal or peri-dural bleeding); organ or nerve damage (i.e.: Any type of peripheral nerve, nerve root, or spinal cord injury) with subsequent damage to sensory, motor, and/or autonomic systems, resulting in permanent pain, numbness, and/or  weakness of one or several areas of the body; allergic reactions; (i.e.: anaphylactic reaction); and/or death. Furthermore, the patient was informed of those risks and complications associated with the medications. These include, but are not limited to: allergic reactions (i.e.: anaphylactic or anaphylactoid reaction(s)); adrenal axis suppression; blood sugar elevation that in diabetics may result in ketoacidosis or comma; water retention that in patients with history of congestive heart failure may result in shortness of breath, pulmonary edema, and decompensation with resultant heart failure; weight gain; swelling or edema; medication-induced neural toxicity; particulate matter embolism and blood vessel occlusion with resultant organ, and/or nervous system infarction; and/or aseptic necrosis of one or more joints. Finally, the patient was informed that Medicine is not an exact science; therefore, there is also the possibility of unforeseen or unpredictable risks and/or possible complications that may result in a catastrophic outcome. The patient indicated having understood very clearly. We have given the patient no guarantees and we have made no promises. Enough time was given to the patient to ask questions, all of which were answered to the patient's satisfaction. Ms. Ventress has indicated that she wanted to continue with the procedure. Attestation: I, the ordering provider, attest that I have discussed with the patient the benefits, risks, side-effects, alternatives, likelihood of achieving goals, and potential problems during recovery for the procedure that I have provided informed consent. Date  Time: {CHL [facility]-PAIN TIME CHOICES:21018001}  Pre-Procedure Preparation:  Monitoring: As per clinic protocol. Respiration, ETCO2, SpO2, BP, heart rate and rhythm monitor placed and checked for adequate function Safety Precautions: Patient was assessed for positional comfort and pressure points before starting the  procedure. Time-out: I initiated and conducted the "Time-out" before starting the procedure, as per protocol. The patient was asked to participate by confirming the accuracy of the "Time Out" information. Verification of the correct person, site, and procedure were performed and confirmed by me, the nursing staff, and the patient. "Time-out" conducted as per Joint Commission's Universal Protocol (UP.01.01.01). Time:    Description/Narrative of Procedure:          Target: Epidural space via  interlaminar opening, initially targeting the lower laminar border of the superior vertebral body. Region: Lumbar Approach: Percutaneous paravertebral  Rationale (medical necessity): procedure needed and proper for the diagnosis and/or treatment of the patient's medical symptoms and needs. Procedural Technique Safety Precautions: Aspiration looking for blood return was conducted prior to all injections. At no point did we inject any substances, as a needle was being advanced. No attempts were made at seeking any paresthesias. Safe injection practices and needle disposal techniques used. Medications properly checked for expiration dates. SDV (single dose vial) medications used. Description of the Procedure: Protocol guidelines were followed. The procedure needle was introduced through the skin, ipsilateral to the reported pain, and advanced to the target area. Bone was contacted and the needle walked caudad, until the lamina was cleared. The epidural space was identified using "loss-of-resistance technique" with 2-3 ml of PF-NaCl (0.9% NSS), in a 5cc LOR glass syringe.  There were no vitals filed for this visit.  Start Time:   hrs. End Time:   hrs.  Imaging Guidance (Spinal):          Type of Imaging Technique: Fluoroscopy Guidance (Spinal) Indication(s): Assistance in needle guidance and placement for procedures requiring needle placement in or near specific anatomical locations not easily accessible without  such assistance. Exposure Time: Please see nurses notes. Contrast: Before injecting any contrast, we confirmed that the patient did not have an allergy to iodine, shellfish, or radiological contrast. Once satisfactory needle placement was completed at the desired level, radiological contrast was injected. Contrast injected under live fluoroscopy. No contrast complications. See chart for type and volume of contrast used. Fluoroscopic Guidance: I was personally present during the use of fluoroscopy. "Tunnel Vision Technique" used to obtain the best possible view of the target area. Parallax error corrected before commencing the procedure. "Direction-depth-direction" technique used to introduce the needle under continuous pulsed fluoroscopy. Once target was reached, antero-posterior, oblique, and lateral fluoroscopic projection used confirm needle placement in all planes. Images permanently stored in EMR. Interpretation: I personally interpreted the imaging intraoperatively. Adequate needle placement confirmed in multiple planes. Appropriate spread of contrast into desired area was observed. No evidence of afferent or efferent intravascular uptake. No intrathecal or subarachnoid spread observed. Permanent images saved into the patient's record.  Antibiotic Prophylaxis:   Anti-infectives (From admission, onward)    None      Indication(s): None identified  Post-operative Assessment:  Post-procedure Vital Signs:  Pulse/HCG Rate:    Temp:   Resp:   BP:   SpO2:    EBL: None  Complications: No immediate post-treatment complications observed by team, or reported by patient.  Note: The patient tolerated the entire procedure well. A repeat set of vitals were taken after the procedure and the patient was kept under observation following institutional policy, for this type of procedure. Post-procedural neurological assessment was performed, showing return to baseline, prior to discharge. The patient  was provided with post-procedure discharge instructions, including a section on how to identify potential problems. Should any problems arise concerning this procedure, the patient was given instructions to immediately contact us, at any time, without hesitation. In any case, we plan to contact the patient by telephone for a follow-up status report regarding this interventional procedure.  Comments:  No additional relevant information.  Plan of Care  Orders:  No orders of the defined types were placed in this encounter.  Chronic Opioid Analgesic:  Oxycodone IR 5 mg 1 tab PO 5X/day (#150/mo) (25 mg/day) MME/day: 37.5 mg/day.  Medications ordered for procedure: No orders of the defined types were placed in this encounter.  Medications administered: Briseidy A. Hara had no medications administered during this visit.  See the medical record for exact dosing, route, and time of administration.  Follow-up plan:   No follow-ups on file.       Interventional Therapies  Risk  Complexity Considerations:   Estimated body mass index is 60.59 kg/m as calculated from the following:   Height as of 06/18/21: '5\' 4"'$  (1.626 m).   Weight as of 06/18/21: 353 lb (160.1 kg). NOTE: NO RFA until BMI less or equal to 35.  (Gastric bypass done on 06/03/2017) Iodine allergy  Contrast dye allergy  Shellfish allergy    Planned  Pending:   Diagnostic right L2-3 LESI #1    Under consideration:   Therapeutic bilateral IA Monovisc knee inj. #1 (after treatment of low back and right leg pain/weakness) Diagnostic bilateral genicular NB    Completed:   Therapeutic bilateral IA Zilretta knee injection x4 (03/25/2022) (100/100/80/80)  Therapeutic bilateral IA Hyalgan knee injection x16 (06/24/2020) (100/100/60/60)  Palliative/therapeutic bilateral lumbar facet MBB x8 (08/25/2021) (100/100/60/60)  Therapeutic right IA hip injection x3 (07/07/2017)  Therapeutic left IA shoulder (glenohumeral joint) injection x1  (05/31/2017)    Completed by other providers:   Therapeutic bilateral THR (total hip replacements) (Dr. Rudene Christians) (Right: 11/27/2019) (Left: 07/29/2015)    Therapeutic  Palliative (PRN) options:   Palliative bilateral knee injections  Palliative bilateral lumbar facet MBBs      Recent Visits Date Type Provider Dept  04/13/22 Office Visit Milinda Pointer, MD [facility]  03/25/22 Procedure visit Milinda Pointer, MD [facility]  Showing recent visits within past 90 days and meeting all other requirements Future Appointments Date Type Provider Dept  04/29/22 Appointment Milinda Pointer, [facility]  05/24/22 Appointment Milinda Pointer, MD [facility]  Showing future appointments within next 90 days and meeting all other requirements  Disposition: Discharge home  Discharge (Date  Time): 04/29/2022;   hrs.   Primary Care Physician: Idelle Crouch, MD Location: [facility] Note by: Gaspar Cola, MD Date: 04/29/2022; Time: 6:25 PM  Disclaimer:  Medicine is not an Chief Strategy Officer. The only guarantee in medicine is that nothing is guaranteed. It is important to note that the decision to proceed with this intervention was based on the information collected from the patient. The Data and conclusions were drawn from the patient's questionnaire, the interview, and the physical examination. Because the information was provided in large part by the patient, it cannot be guaranteed that it has not been purposely or unconsciously manipulated. Every effort has been made to obtain as much relevant data as possible for this evaluation. It is important to note that the conclusions that lead to this procedure are derived in large part from the available data. Always take into account that the treatment will also be dependent on availability of resources and existing treatment guidelines, considered by other Pain Management  Practitioners as being common knowledge and practice, at the time of the intervention. For Medico-Legal purposes, it is also important to point out that variation in procedural techniques and pharmacological choices are the acceptable norm. The indications, contraindications, technique, and results of the above procedure should only be interpreted and judged by a Board-Certified Interventional Pain Specialist with extensive familiarity and expertise in the same exact procedure and technique.

## 2022-04-29 ENCOUNTER — Ambulatory Visit: Payer: Medicare HMO | Attending: Pain Medicine | Admitting: Pain Medicine

## 2022-04-29 ENCOUNTER — Encounter: Payer: Self-pay | Admitting: Pain Medicine

## 2022-04-29 ENCOUNTER — Ambulatory Visit
Admission: RE | Admit: 2022-04-29 | Discharge: 2022-04-29 | Disposition: A | Discharge status: Home / Self Care | Payer: Medicare HMO | Source: Ambulatory Visit | Attending: Pain Medicine | Admitting: Pain Medicine

## 2022-04-29 VITALS — BP 121/90 | HR 79 | Temp 97.1°F | Resp 16 | Ht 63.0 in | Wt 363.0 lb

## 2022-04-29 DIAGNOSIS — M47816 Spondylosis without myelopathy or radiculopathy, lumbar region: Secondary | ICD-10-CM | POA: Insufficient documentation

## 2022-04-29 DIAGNOSIS — M5137 Other intervertebral disc degeneration, lumbosacral region: Secondary | ICD-10-CM | POA: Diagnosis not present

## 2022-04-29 DIAGNOSIS — M4826 Kissing spine, lumbar region: Secondary | ICD-10-CM | POA: Insufficient documentation

## 2022-04-29 DIAGNOSIS — M545 Low back pain, unspecified: Secondary | ICD-10-CM | POA: Insufficient documentation

## 2022-04-29 DIAGNOSIS — R937 Abnormal findings on diagnostic imaging of other parts of musculoskeletal system: Secondary | ICD-10-CM | POA: Diagnosis not present

## 2022-04-29 DIAGNOSIS — R29898 Other symptoms and signs involving the musculoskeletal system: Secondary | ICD-10-CM | POA: Insufficient documentation

## 2022-04-29 DIAGNOSIS — M48061 Spinal stenosis, lumbar region without neurogenic claudication: Secondary | ICD-10-CM | POA: Diagnosis not present

## 2022-04-29 DIAGNOSIS — G8929 Other chronic pain: Secondary | ICD-10-CM | POA: Diagnosis not present

## 2022-04-29 DIAGNOSIS — Z888 Allergy status to other drugs, medicaments and biological substances status: Secondary | ICD-10-CM | POA: Insufficient documentation

## 2022-04-29 DIAGNOSIS — Z91013 Allergy to seafood: Secondary | ICD-10-CM | POA: Diagnosis present

## 2022-04-29 DIAGNOSIS — M4807 Spinal stenosis, lumbosacral region: Secondary | ICD-10-CM | POA: Insufficient documentation

## 2022-04-29 DIAGNOSIS — M5417 Radiculopathy, lumbosacral region: Secondary | ICD-10-CM | POA: Diagnosis not present

## 2022-04-29 DIAGNOSIS — Z6841 Body Mass Index (BMI) 40.0 and over, adult: Secondary | ICD-10-CM | POA: Insufficient documentation

## 2022-04-29 DIAGNOSIS — M79651 Pain in right thigh: Secondary | ICD-10-CM | POA: Insufficient documentation

## 2022-04-29 DIAGNOSIS — M4606 Spinal enthesopathy, lumbar region: Secondary | ICD-10-CM | POA: Insufficient documentation

## 2022-04-29 MED ORDER — SODIUM CHLORIDE 0.9% FLUSH
2.0000 mL | Freq: Once | INTRAVENOUS | Status: AC
  Administered 2022-04-29: 2 mL

## 2022-04-29 MED ORDER — ROPIVACAINE HCL 2 MG/ML IJ SOLN
2.0000 mL | Freq: Once | INTRAMUSCULAR | Status: AC
  Administered 2022-04-29: 2 mL via EPIDURAL
  Filled 2022-04-29: qty 20

## 2022-04-29 MED ORDER — LACTATED RINGERS IV SOLN: Freq: Once | INTRAVENOUS | Status: AC

## 2022-04-29 MED ORDER — PENTAFLUOROPROP-TETRAFLUOROETH EX AERO
INHALATION_SPRAY | Freq: Once | CUTANEOUS | Status: DC
  Filled 2022-04-29: qty 116

## 2022-04-29 MED ORDER — IOHEXOL 180 MG/ML  SOLN
INTRAMUSCULAR | Status: AC
  Filled 2022-04-29: qty 20

## 2022-04-29 MED ORDER — LIDOCAINE HCL 2 % IJ SOLN
20.0000 mL | Freq: Once | INTRAMUSCULAR | Status: AC
  Administered 2022-04-29: 400 mg
  Filled 2022-04-29: qty 20

## 2022-04-29 MED ORDER — TRIAMCINOLONE ACETONIDE 40 MG/ML IJ SUSP
40.0000 mg | Freq: Once | INTRAMUSCULAR | Status: AC
  Administered 2022-04-29: 40 mg
  Filled 2022-04-29: qty 1

## 2022-04-29 MED ORDER — MIDAZOLAM HCL 5 MG/5ML IJ SOLN
0.5000 mg | Freq: Once | INTRAMUSCULAR | Status: AC
  Administered 2022-04-29: 2 mg via INTRAVENOUS
  Filled 2022-04-29: qty 5

## 2022-04-29 MED ORDER — IOHEXOL 180 MG/ML  SOLN: 10.0000 mL | Freq: Once | INTRAMUSCULAR | Status: DC

## 2022-04-29 NOTE — Progress Notes (Signed)
Safety precautions to be maintained throughout the outpatient stay will include: orient to surroundings, keep bed in low position, maintain call bell within reach at all times, provide assistance with transfer out of bed and ambulation.  

## 2022-04-29 NOTE — Patient Instructions (Signed)
____________________________________________________________________________________________  Virtual Visits   What is a "Virtual Visit"? It is a healthcare communication encounter (medical visit) that takes place on real time (NOT TEXT or E-MAIL) over the telephone or computer device (desktop, laptop, tablet, smart phone, etc.). It allows for more location flexibility between the patient and the healthcare provider.  Who decides when these types of visits will be used? The physician.  Who is eligible for these types of visits? Only those patients that can be reliably reached over the telephone.  What do you mean by reliably? We do not have time to call everyone multiple times, therefore those that tend to screen calls and then call back later are not suitable candidates for this system. We understand how people are reluctant to pickup on "unknown" calls, therefore, we suggest adding our telephone numbers to your list of "CONTACT(s)". This way, you should be able to readily identify our calls when you receive one. All of our numbers are available below.   Who is not eligible? This option is not available for medication management encounters, specially for controlled substances. Patients on pain medications that fall under the category of controlled substances have to come in for "Face-to-Face" encounters. This is required for mandatory monitoring of these substances. You may be asked to provide a sample for an unannounced urine drug screening test (UDS), and we will need to count your pain pills. Not bringing your pills to be counted may result in no refill. Obviously, neither one of these can be done over the phone.  When will this type of visits be used? You can request a virtual visit whenever you are physically unable to attend a regular appointment. The decision will be made by the physician (or healthcare provider) on a case by case basis.   At what time will I be called? This is an  excellent question. The providers will try to call you whenever they have time available. Do not expect to be called at any specific time. The secretaries will assign you a time for your virtual visit appointment, but this is done simply to keep a list of those patients that need to be called, but not for the purpose of keeping a time schedule. Be advised that the call may come in anytime during the day, between the hours of 8:00 AM and 8::00 PM, depending on provider availability. We do understand that the system is not perfect. If you are unable to be available that day on a moments notice, then request an "in-person" appointment rather than a "virtual visit".  Can I request my medication visits to be "Virtual"? Yes you may request it, but the decision is entirely up to the healthcare provider. Control substances require specific monitoring that requires Face-to-Face encounters. The number of encounters  and the extent of the monitoring is determined on a case by case basis.  Add a new contact to your smart phone and label it "PAIN CLINIC" Under this contact add the following numbers: Main: [phone] (Official Contact Number) Nurses: [phone] (These are outgoing only calling systems. Do not call this number.) Dr. Ellianah Cordy: [phone] or [phone] (Outgoing calls only. Do not call this number.)  ____________________________________________________________________________________________    ____________________________________________________________________________________________  Post-Procedure Discharge Instructions  Instructions: Apply ice:  Purpose: This will minimize any swelling and discomfort after procedure.  When: Day of procedure, as soon as you get home. How: Fill a plastic sandwich bag with crushed ice. Cover it with a small towel and apply   to injection site. How long: (15 min on, 15 min off) Apply for 15 minutes then remove x 15 minutes.  Repeat sequence on  day of procedure, until you go to bed. Apply heat:  Purpose: To treat any soreness and discomfort from the procedure. When: Starting the next day after the procedure. How: Apply heat to procedure site starting the day following the procedure. How long: May continue to repeat daily, until discomfort goes away. Food intake: Start with clear liquids (like water) and advance to regular food, as tolerated.  Physical activities: Keep activities to a minimum for the first 8 hours after the procedure. After that, then as tolerated. Driving: If you have received any sedation, be responsible and do not drive. You are not allowed to drive for 24 hours after having sedation. Blood thinner: (Applies only to those taking blood thinners) You may restart your blood thinner 6 hours after your procedure. Insulin: (Applies only to Diabetic patients taking insulin) As soon as you can eat, you may resume your normal dosing schedule. Infection prevention: Keep procedure site clean and dry. Shower daily and clean area with soap and water. Post-procedure Pain Diary: Extremely important that this be done correctly and accurately. Recorded information will be used to determine the next step in treatment. For the purpose of accuracy, follow these rules: Evaluate only the area treated. Do not report or include pain from an untreated area. For the purpose of this evaluation, ignore all other areas of pain, except for the treated area. After your procedure, avoid taking a long nap and attempting to complete the pain diary after you wake up. Instead, set your alarm clock to go off every hour, on the hour, for the initial 8 hours after the procedure. Document the duration of the numbing medicine, and the relief you are getting from it. Do not go to sleep and attempt to complete it later. It will not be accurate. If you received sedation, it is likely that you were given a medication that may cause amnesia. Because of this,  completing the diary at a later time may cause the information to be inaccurate. This information is needed to plan your care. Follow-up appointment: Keep your post-procedure follow-up evaluation appointment after the procedure (usually 2 weeks for most procedures, 6 weeks for radiofrequencies). DO NOT FORGET to bring you pain diary with you.   Expect: (What should I expect to see with my procedure?) From numbing medicine (AKA: Local Anesthetics): Numbness or decrease in pain. You may also experience some weakness, which if present, could last for the duration of the local anesthetic. Onset: Full effect within 15 minutes of injected. Duration: It will depend on the type of local anesthetic used. On the average, 1 to 8 hours.  From steroids (Applies only if steroids were used): Decrease in swelling or inflammation. Once inflammation is improved, relief of the pain will follow. Onset of benefits: Depends on the amount of swelling present. The more swelling, the longer it will take for the benefits to be seen. In some cases, up to 10 days. Duration: Steroids will stay in the system x 2 weeks. Duration of benefits will depend on multiple posibilities including persistent irritating factors. Side-effects: If present, they may typically last 2 weeks (the duration of the steroids). Frequent: Cramps (if they occur, drink Gatorade and take over-the-counter Magnesium 450-500 mg once to twice a day); water retention with temporary weight gain; increases in blood sugar; decreased immune system response; increased appetite. Occasional: Facial flushing (  red, warm cheeks); mood swings; menstrual changes. Uncommon: Long-term decrease or suppression of natural hormones; bone thinning. (These are more common with higher doses or more frequent use. This is why we prefer that our patients avoid having any injection therapies in other practices.)  Very Rare: Severe mood changes; psychosis; aseptic necrosis. From  procedure: Some discomfort is to be expected once the numbing medicine wears off. This should be minimal if ice and heat are applied as instructed.  Call if: (When should I call?) You experience numbness and weakness that gets worse with time, as opposed to wearing off. New onset bowel or bladder incontinence. (Applies only to procedures done in the spine)  Emergency Numbers: Durning business hours (Monday - Thursday, 8:00 AM - 4:00 PM) (Friday, 9:00 AM - 12:00 Noon): [phone] After hours: [phone] NOTE: If you are having a problem and are unable connect with, or to talk to a provider, then go to your nearest urgent care or emergency department. If the problem is serious and urgent, please call 911. ____________________________________________________________________________________________    

## 2022-04-30 ENCOUNTER — Telehealth: Payer: Self-pay

## 2022-04-30 NOTE — Telephone Encounter (Signed)
Called PP to check on pt. No answer. Left message to calli f needed.

## 2022-05-09 NOTE — Progress Notes (Unsigned)
Patient: Karen Lewis  Service Category: E/M  Provider: Gaspar Cola, MD  DOB: 07/12/1971  DOS: 05/13/2022  Location: Office  MRN: [id number]  Setting: Ambulatory outpatient  Referring Provider: Idelle Crouch, MD  Type: Established Patient  Specialty: Interventional Pain Management  PCP: Idelle Crouch, MD  Location: Remote location  Delivery: TeleHealth     Virtual Encounter - Pain Management PROVIDER NOTE: Information contained herein reflects review and annotations entered in association with encounter. Interpretation of such information and data should be left to medically-trained personnel. Information provided to patient can be located elsewhere in the medical record under "Patient Instructions". Document created using STT-dictation technology, any transcriptional errors that may result from process are unintentional.    Contact & Pharmacy Preferred: [phone] Home: [phone] (home) Mobile: [phone] (mobile) E-mail: [email or web address]  [place], [place] - [address] [place] [address] Phone: [phone] Fax: [phone]   Pre-screening  Karen Lewis offered "in-person" vs "virtual" encounter. She indicated preferring virtual for this encounter.   Reason COVID-19*  Social distancing based on CDC and AMA recommendations.   I contacted Karen Lewis on 05/13/2022 via telephone.      I clearly identified myself as Gaspar Cola, MD. I verified that I was speaking with the correct person using two identifiers (Name: Karen Lewis, and date of birth: Sep 16, 1970).  Consent I sought verbal advanced consent from Karen Lewis for virtual visit interactions. I informed Karen Lewis of possible security and privacy concerns, risks, and limitations associated with providing "not-in-person" medical evaluation and management services. I also informed Karen Lewis of the availability of "in-person" appointments. Finally, I informed her  that there would be a charge for the virtual visit and that she could be  personally, fully or partially, financially responsible for it. Karen Lewis expressed understanding and agreed to proceed.   Historic Elements   Karen Lewis is a 51 y.o. year old, female patient evaluated today after our last contact on 04/29/2022. Karen Lewis  has a past medical history of Anemia, Anginal pain ([place]), Anxiety, Arthralgia of hip (07/29/2015), Arthritis, Arthritis, degenerative (07/29/2015), Asthma, Cephalalgia (07/25/2014), Dependence on unknown drug ([place]), Depression, Diabetes mellitus without complication ([place]), Difficult intubation, Dysrhythmia, Eczema, Fibromyalgia, Gastritis, GERD (gastroesophageal reflux disease), Gonalgia (07/29/2015), Gout, H/O cardiovascular disorder (03/10/2015), H/O surgical procedure (12/05/2012), H/O thyroid disease (03/10/2015), Headache, Herpes, History of artificial joint (07/29/2015), History of hiatal hernia, Hypertension, Hypomagnesemia, Hypothyroidism, IDA (iron deficiency anemia) (05/28/2019), LBP (low back pain) (07/29/2015), Neuromuscular disorder ([place]), Obesity, PCOS (polycystic ovarian syndrome), Primary osteoarthritis of both knees (07/29/2015), Sleep apnea, Thyroid nodule (bilateral), and Umbilical hernia. She also  has a past surgical history that includes Laparoscopic partial gastrectomy; Shoulder arthroscopy (Right); Carpal tunnel release (Bilateral); Diagnostic laparoscopy; Cholecystectomy; Trigger finger release (Right); Thyroidectomy (N/A, 11/12/2015); left trigger finger; Roux-en-Y Gastric Bypass (06/03/2017); Hiatal hernia repair; peniculectomy (N/A, 07/05/2018); Total hip arthroplasty (Right, 11/27/2019); Joint replacement (Bilateral, hip); Appendectomy; Trigger finger release (Right, 04/24/2020); Colonoscopy with propofol (N/A, 02/11/2021); and Esophagogastroduodenoscopy (egd) with propofol (N/A, 02/11/2021). Karen Lewis has a current medication list which includes the following  prescription(s): accu-chek aviva plus, acetaminophen, acidophilus, albuterol, amitriptyline, vitamin c, biotin, butalbital-acetaminophen-caffeine, calcium carbonate, clonazepam, b-12, diclofenac sodium, diphenhydramine, dupixent, epinephrine, ergocalciferol, famotidine, fluticasone furoate-vilanterol, furosemide, emgality, accu-chek aviva plus, levocetirizine, levothyroxine, lisinopril, magnesium oxide, meclizine, metformin, metoprolol tartrate, montelukast, tab-a-vite, naphazoline-pheniramine, naproxen sodium, nitrofurantoin (macrocrystal-monohydrate), omeprazole, oxycodone, ozempic (0.25 or 0.5 mg/dose), pregabalin, quetiapine, rizatriptan, rizatriptan, rosuvastatin, sertraline, spironolactone,  tizanidine, topiramate, and zolpidem. She  reports that she has never smoked. She has never used smokeless tobacco. She reports that she does not drink alcohol and does not use drugs. Karen Lewis is allergic to bactrim [sulfamethoxazole-trimethoprim], omalizumab, ciprofloxacin, shellfish allergy, and nsaids.   HPI  Today, she is being contacted for a post-procedure assessment.  The patient has attained an ongoing 60% improvement of her low back pain.  She has an appointment to be evaluated by a neurosurgeon for her spinal stenosis.  Today we had a very long conversation with regards to when is it that she would need lumbar facet blocks versus lumbar epidural steroid injections.  I have explained to her the difference between facet joint pain and radiculopathy/radiculitis.  Today I have also reminded the patient that we do not want to do any procedures containing steroids any sooner than 60 days, in order to avoid or minimize the possible side effects of chronic steroid use.  At this point, the patient has requested that we bring her in for a bilateral intra-articular knee injection using Monovisc (gel injection).  The patient was reminded that as she continues to lose more intra-articular cartilage, the effectiveness of any  of these injections will continue to decrease.  She understood and accepted.  Post-procedure evaluation   Type: Lumbar epidural steroid injection (LESI) (interlaminar) #1    Laterality: Right   Level:  L2-3 Level.  Imaging: Fluoroscopic guidance         Anesthesia: Local anesthesia (1-2% Lidocaine) Anxiolysis: IV Versed 2.0 mg Sedation: No Sedation              .  DOS: 04/29/2022  Performed by: Gaspar Cola, MD  Purpose: Diagnostic/Therapeutic Indications: Lumbar radicular pain of intraspinal etiology of more than 4 weeks that has failed to respond to conservative therapy and is severe enough to impact quality of life or function. 1. Lumbosacral radiculopathy at L2 (Right)   2. Lumbar central spinal stenosis, w/o claudication (L2-3, L4-5, L5-S1)   3. Lumbar foraminal stenosis (Bilateral: L2-3, L4-5) (L>R)   4. DDD (degenerative disc disease), lumbosacral   5. Chronic low back pain (3ry area of Pain) (Bilateral) (R>L) w/o sciatica   6. Abnormal MRI, lumbar spine (11/10/2021)    Class 3 severe obesity due to excess calories with serious comorbidity and body mass index (BMI) of 60.0 to 69.9 in adult (HCC) (362 lbs)   H/O allergy to shellfish    Allergy to iodine    NAS-11 Pain score:   Pre-procedure: 4 /10   Post-procedure: 2 /10     Effectiveness:  Initial hour after procedure: 100 %. Subsequent 4-6 hours post-procedure: 100 %. Analgesia past initial 6 hours: 60 % (ongoing, but is starting to feel a little more pain.). Ongoing improvement:  Analgesic: The patient indicates having attained an ongoing 60% improvement of her low back pain. Function: Karen Lewis reports improvement in function ROM: Karen Lewis reports improvement in ROM  Pharmacotherapy Assessment   Opioid Analgesic: Oxycodone IR 5 mg 1 tab PO 5X/day (#150/mo) (25 mg/day) MME/day: 37.5 mg/day.   Monitoring: [place] PMP: PDMP reviewed during this encounter.       Pharmacotherapy: No side-effects or adverse  reactions reported. Compliance: No problems identified. Effectiveness: Clinically acceptable. Plan: Refer to "POC". UDS:  Summary  Date Value Ref Range Status  10/26/2021 Note  Final    Comment:    ==================================================================== [employer] Select 13 (MW) ==================================================================== Test  Result       Flag       Units  Drug Present and Declared for Prescription Verification   7-aminoclonazepam              48           EXPECTED   ng/mg creat    7-aminoclonazepam is an expected metabolite of clonazepam. Source of    clonazepam is a scheduled prescription medication.    Noroxycodone                   518          EXPECTED   ng/mg creat    Noroxycodone is an expected metabolite of oxycodone. Sources of    oxycodone include scheduled prescription medications.  Drug Absent but Declared for Prescription Verification   Oxycodone                      Not Detected UNEXPECTED ng/mg creat    Oxycodone is almost always present in patients taking this drug    consistently.  Absence of oxycodone could be due to lapse of time    since the last dose or unusual pharmacokinetics (rapid metabolism).    Butalbital                     Not Detected UNEXPECTED ==================================================================== Test                      Result    Flag   Units      Ref Range   Creatinine              88               mg/dL      >=20 ==================================================================== Declared Medications:  The flagging and interpretation on this report are based on the  following declared medications.  Unexpected results may arise from  inaccuracies in the declared medications.   **Note: The testing scope of this panel includes these medications:   Butalbital (Fioricet)  Clonazepam (Klonopin)  Oxycodone   **Note: The testing scope of this panel does not include  the  following reported medications:   Acetaminophen (Tylenol)  Acetaminophen (Fioricet)  Albuterol (Ventolin HFA)  Amitriptyline (Elavil)  Benzonatate (Tessalon)  Biotin  Caffeine (Fioricet)  Calcium (Tums)  Diclofenac (Voltaren)  Diphenhydramine (Benadryl)  Dupilumab (Dupixent)  Epinephrine  Eye Drop  Famotidine (Pepcid)  Fluticasone (Breo)  Furosemide (Lasix)  Guaifenesin (Robitussin)  Levocetirizine (Xyzal)  Levothyroxine (Synthroid)  Lisinopril (Zestril)  Magnesium (Mag-Ox)  Meclizine (Antivert)  Metformin (Glucophage)  Metoprolol (Lopressor)  Montelukast (Singulair)  Multivitamin  Naloxone (Narcan)  Naproxen (Aleve)  Nitrofurantoin (Macrobid)  Omeprazole (Prilosec)  Pregabalin (Lyrica)  Probiotic  Quetiapine (Seroquel)  Rizatriptan (Maxalt)  Rosuvastatin (Crestor)  Semaglutide (Ozempic)  Spironolactone (Aldactone)  Tizanidine (Zanaflex)  Vilanterol (Breo)  Vitamin B12  Vitamin C  Vitamin D2  Zolpidem (Ambien) ==================================================================== For clinical consultation, please call [phone]. ====================================================================    No results found for: "CBDTHCR", "D8THCCBX", "D9THCCBX"   Laboratory Chemistry Profile   Renal Lab Results  Component Value Date   BUN 19 04/28/2021   CREATININE 1.06 (H) 04/28/2021   GFRAA >60 04/16/2020   GFRNONAA >60 04/28/2021    Hepatic Lab Results  Component Value Date   AST 34 04/22/2021   ALT 34 04/22/2021   ALBUMIN 4.1 04/22/2021   ALKPHOS 63 04/22/2021    Electrolytes Lab Results  Component Value Date   NA 134 (L) 04/28/2021   K 4.5 04/28/2021   CL 101 04/28/2021   CALCIUM 8.4 (L) 04/28/2021   MG 2.1 04/28/2021   PHOS 5.5 (H) 05/22/2019    Bone Lab Results  Component Value Date   VD25OH 27.4 (L) 11/24/2015   VD125OH2TOT 41.5 11/24/2015    Inflammation (CRP: Acute Phase) (ESR: Chronic Phase) Lab Results  Component  Value Date   CRP 0.7 09/27/2019   ESRSEDRATE 30 (H) 09/27/2019         Note: Above Lab results reviewed.  Imaging  DG PAIN CLINIC C-ARM 1-60 MIN NO REPORT Fluoro was used, but no Radiologist interpretation will be provided.  Please refer to "NOTES" tab for provider progress note.  Assessment  The primary encounter diagnosis was Chronic low back pain (3ry area of Pain) (Bilateral) (R>L) w/o sciatica. Diagnoses of Lumbosacral radiculopathy at L2 (Right), Chronic thigh pain (Right), Chronic knee pain (1ry area of Pain) (Bilateral) (R>L), Secondary Osteoarthritis of knee (Bilateral) (R>L), and Tricompartment osteoarthritis of knees (Bilateral) were also pertinent to this visit.  Plan of Care  Problem-specific:  No problem-specific Assessment & Plan notes found for this encounter.  Karen Lewis has a current medication list which includes the following long-term medication(s): albuterol, clonazepam, diphenhydramine, furosemide, levothyroxine, lisinopril, magnesium oxide, metformin, metoprolol tartrate, montelukast, oxycodone, pregabalin, rizatriptan, rizatriptan, and spironolactone.  Pharmacotherapy (Medications Ordered): No orders of the defined types were placed in this encounter.  Orders:  Orders Placed This Encounter  Procedures   KNEE INJECTION    Indications: Knee arthralgia (pain) due to osteoarthritis (OA) Imaging: Fluoroscopy ([id number]) Nursing: Please request pharmacy to have Monovisc (Hyaluronan) available in office.    Standing Status:   Future    Standing Expiration Date:   08/12/2022    Scheduling Instructions:     Procedure: Knee injection Monovisc (Hyaluronan)     Treatment No.: 1     Level: Intra-articular     Laterality: Bilateral Knee     Sedation: Patient's choice.     Timeframe: As soon as schedule allows     Total Hyaluronic acid (AKA hyaluronan or hyaluronate) per injection: 88 mg (22.0 mg/mL x 4 mL). Series of 1 injection.    Order Specific Question:    Where will this procedure be performed?    Answer:   [facility]   Follow-up plan:   Return for procedure (Clinic): (B) Monovisc Knee inj. #1 under fluoroscopic guidance.     Interventional Therapies  Risk  Complexity Considerations:   Estimated body mass index is 60.59 kg/m as calculated from the following:   Height as of 06/18/21: '5\' 4"'  (1.626 m).   Weight as of 06/18/21: 353 lb (160.1 kg). NOTE: NO RFA until BMI less or equal to 35.  (Gastric bypass done on 06/03/2017) Iodine allergy  Contrast dye allergy  Shellfish allergy    Planned  Pending:   Therapeutic bilateral IA Monovisc knee inj. #1    Under consideration:   Diagnostic bilateral genicular NB    Completed:   Therapeutic right L2-3 LESI x1 (04/29/2022) (100/100/60/60)  Therapeutic bilateral IA Zilretta knee injection x4 (03/25/2022) (100/100/80/80)  Therapeutic bilateral IA Hyalgan knee injection x16 (06/24/2020) (100/100/60/60)  Palliative/therapeutic bilateral lumbar facet MBB x8 (08/25/2021) (100/100/60/60)  Therapeutic right IA hip injection x3 (07/07/2017)  Therapeutic left IA shoulder (glenohumeral joint) injection x1 (05/31/2017)    Completed by other providers:   Therapeutic bilateral THR (total hip replacements) (Dr. Rudene Christians) (Right: 11/27/2019) (  Left: 07/29/2015)    Therapeutic  Palliative (PRN) options:   Palliative bilateral knee injections  Palliative bilateral lumbar facet MBBs       Recent Visits Date Type Provider Dept  04/29/22 Procedure visit Milinda Pointer, MD [facility]  04/13/22 Office Visit Milinda Pointer, MD [facility]  03/25/22 Procedure visit Milinda Pointer, MD [facility]  Showing recent visits within past 90 days and meeting all other requirements Today's Visits Date Type Provider Dept  05/13/22 Office Visit Milinda Pointer, MD [facility]  Showing today's visits and meeting all other requirements Future Appointments Date  Type Provider Dept  05/24/22 Appointment Milinda Pointer, MD [facility]  Showing future appointments within next 90 days and meeting all other requirements  I discussed the assessment and treatment plan with the patient. The patient was provided an opportunity to ask questions and all were answered. The patient agreed with the plan and demonstrated an understanding of the instructions.  Patient advised to call back or seek an in-person evaluation if the symptoms or condition worsens.  Duration of encounter: 17 minutes.  Note by: Gaspar Cola, MD Date: 05/13/2022; Time: 12:11 PM

## 2022-05-11 DIAGNOSIS — F419 Anxiety disorder, unspecified: Secondary | ICD-10-CM | POA: Diagnosis not present

## 2022-05-11 DIAGNOSIS — F4312 Post-traumatic stress disorder, chronic: Secondary | ICD-10-CM | POA: Diagnosis not present

## 2022-05-11 DIAGNOSIS — F331 Major depressive disorder, recurrent, moderate: Secondary | ICD-10-CM | POA: Diagnosis not present

## 2022-05-12 DIAGNOSIS — G43719 Chronic migraine without aura, intractable, without status migrainosus: Secondary | ICD-10-CM | POA: Diagnosis not present

## 2022-05-13 ENCOUNTER — Ambulatory Visit: Payer: Medicare HMO | Attending: Pain Medicine | Admitting: Pain Medicine

## 2022-05-13 DIAGNOSIS — M25561 Pain in right knee: Secondary | ICD-10-CM

## 2022-05-13 DIAGNOSIS — G8929 Other chronic pain: Secondary | ICD-10-CM | POA: Diagnosis not present

## 2022-05-13 DIAGNOSIS — M545 Low back pain, unspecified: Secondary | ICD-10-CM

## 2022-05-13 DIAGNOSIS — M79651 Pain in right thigh: Secondary | ICD-10-CM

## 2022-05-13 DIAGNOSIS — M174 Other bilateral secondary osteoarthritis of knee: Secondary | ICD-10-CM

## 2022-05-13 DIAGNOSIS — M5417 Radiculopathy, lumbosacral region: Secondary | ICD-10-CM

## 2022-05-13 DIAGNOSIS — M170 Bilateral primary osteoarthritis of knee: Secondary | ICD-10-CM | POA: Diagnosis not present

## 2022-05-13 DIAGNOSIS — M25562 Pain in left knee: Secondary | ICD-10-CM

## 2022-05-13 NOTE — Patient Instructions (Signed)
______________________________________________________________________  Preparing for Procedure with Sedation  NOTICE: Due to recent regulatory changes, starting on April 13, 2021, procedures requiring intravenous (IV) sedation will no longer be performed at the [facility].  These types of procedures are required to be performed at [facility] ambulatory surgery facility.  We are very sorry for the inconvenience.  Procedure appointments are limited to planned procedures: No Prescription Refills. No disability issues will be discussed. No medication changes will be discussed.  Instructions: Oral Intake: Do not eat or drink anything for at least 8 hours prior to your procedure. (Exception: Blood Pressure Medication. See below.) Transportation: A driver is required. You may not drive yourself after the procedure. Blood Pressure Medicine: Do not forget to take your blood pressure medicine with a sip of water the morning of the procedure. If your Diastolic (lower reading) is above 100 mmHg, elective cases will be cancelled/rescheduled. Blood thinners: These will need to be stopped for procedures. Notify our staff if you are taking any blood thinners. Depending on which one you take, there will be specific instructions on how and when to stop it. Diabetics on insulin: Notify the staff so that you can be scheduled 1st case in the morning. If your diabetes requires high dose insulin, take only  of your normal insulin dose the morning of the procedure and notify the staff that you have done so. Preventing infections: Shower with an antibacterial soap the morning of your procedure. Build-up your immune system: Take 1000 mg of Vitamin C with every meal (3 times a day) the day prior to your procedure. Antibiotics: Inform the staff if you have a condition or reason that requires you to take antibiotics before dental procedures. Pregnancy: If you are pregnant, call and cancel the procedure. Sickness: If  you have a cold, fever, or any active infections, call and cancel the procedure. Arrival: You must be in the facility at least 30 minutes prior to your scheduled procedure. Children: Do not bring children with you. Dress appropriately: There is always the possibility that your clothing may get soiled. Valuables: Do not bring any jewelry or valuables.  Reasons to call and reschedule or cancel your procedure: (Following these recommendations will minimize the risk of a serious complication.) Surgeries: Avoid having procedures within 2 weeks of any surgery. (Avoid for 2 weeks before or after any surgery). Flu Shots: Avoid having procedures within 2 weeks of a flu shots. (Avoid for 2 weeks before or after immunizations). Barium: Avoid having a procedure within 7-10 days after having had a radiological study involving the use of radiological contrast. (Myelograms, Barium swallow or enema study). Heart attacks: Avoid any elective procedures or surgeries for the initial 6 months after a "Myocardial Infarction" (Heart Attack). Blood thinners: It is imperative that you stop these medications before procedures. Let us know if you if you take any blood thinner.  Infection: Avoid procedures during or within two weeks of an infection (including chest colds or gastrointestinal problems). Symptoms associated with infections include: Localized redness, fever, chills, night sweats or profuse sweating, burning sensation when voiding, cough, congestion, stuffiness, runny nose, sore throat, diarrhea, nausea, vomiting, cold or Flu symptoms, recent or current infections. It is specially important if the infection is over the area that we intend to treat. Heart and lung problems: Symptoms that may suggest an active cardiopulmonary problem include: cough, chest pain, breathing difficulties or shortness of breath, dizziness, ankle swelling, uncontrolled high or unusually low blood pressure, and/or palpitations. If you are  experiencing any of these symptoms, cancel your procedure and contact your primary care physician for an evaluation.  Remember:  Regular Business hours are:  Monday to Thursday 8:00 AM to 4:00 PM  Provider's Schedule: Milinda Pointer, MD:  Procedure days: Tuesday and Thursday 7:30 AM to 4:00 PM  Gillis Santa, MD:  Procedure days: Monday and Wednesday 7:30 AM to 4:00 PM ______________________________________________________________________  ____________________________________________________________________________________________  General Risks and Possible Complications  Patient Responsibilities: It is important that you read this as it is part of your informed consent. It is our duty to inform you of the risks and possible complications associated with treatments offered to you. It is your responsibility as a patient to read this and to ask questions about anything that is not clear or that you believe was not covered in this document.  Patient's Rights: You have the right to refuse treatment. You also have the right to change your mind, even after initially having agreed to have the treatment done. However, under this last option, if you wait until the last second to change your mind, you may be charged for the materials used up to that point.  Introduction: Medicine is not an Chief Strategy Officer. Everything in Medicine, including the lack of treatment(s), carries the potential for danger, harm, or loss (which is by definition: Risk). In Medicine, a complication is a secondary problem, condition, or disease that can aggravate an already existing one. All treatments carry the risk of possible complications. The fact that a side effects or complications occurs, does not imply that the treatment was conducted incorrectly. It must be clearly understood that these can happen even when everything is done following the highest safety standards.  No treatment: You can choose not to proceed with the  proposed treatment alternative. The "PRO(s)" would include: avoiding the risk of complications associated with the therapy. The "CON(s)" would include: not getting any of the treatment benefits. These benefits fall under one of three categories: diagnostic; therapeutic; and/or palliative. Diagnostic benefits include: getting information which can ultimately lead to improvement of the disease or symptom(s). Therapeutic benefits are those associated with the successful treatment of the disease. Finally, palliative benefits are those related to the decrease of the primary symptoms, without necessarily curing the condition (example: decreasing the pain from a flare-up of a chronic condition, such as incurable terminal cancer).  General Risks and Complications: These are associated to most interventional treatments. They can occur alone, or in combination. They fall under one of the following six (6) categories: no benefit or worsening of symptoms; bleeding; infection; nerve damage; allergic reactions; and/or death. No benefits or worsening of symptoms: In Medicine there are no guarantees, only probabilities. No healthcare provider can ever guarantee that a medical treatment will work, they can only state the probability that it may. Furthermore, there is always the possibility that the condition may worsen, either directly, or indirectly, as a consequence of the treatment. Bleeding: This is more common if the patient is taking a blood thinner, either prescription or over the counter (example: Goody Powders, Fish oil, Aspirin, Garlic, etc.), or if suffering a condition associated with impaired coagulation (example: Hemophilia, cirrhosis of the liver, low platelet counts, etc.). However, even if you do not have one on these, it can still happen. If you have any of these conditions, or take one of these drugs, make sure to notify your treating physician. Infection: This is more common in patients with a compromised  immune system, either due to disease (example:  diabetes, cancer, human immunodeficiency virus [HIV], etc.), or due to medications or treatments (example: therapies used to treat cancer and rheumatological diseases). However, even if you do not have one on these, it can still happen. If you have any of these conditions, or take one of these drugs, make sure to notify your treating physician. Nerve Damage: This is more common when the treatment is an invasive one, but it can also happen with the use of medications, such as those used in the treatment of cancer. The damage can occur to small secondary nerves, or to large primary ones, such as those in the spinal cord and brain. This damage may be temporary or permanent and it may lead to impairments that can range from temporary numbness to permanent paralysis and/or brain death. Allergic Reactions: Any time a substance or material comes in contact with our body, there is the possibility of an allergic reaction. These can range from a mild skin rash (contact dermatitis) to a severe systemic reaction (anaphylactic reaction), which can result in death. Death: In general, any medical intervention can result in death, most of the time due to an unforeseen complication. ____________________________________________________________________________________________  ______________________________________________________________________________________________  Body mass index (BMI)  Body mass index (BMI) is a common tool for deciding whether a person has an appropriate body weight.  It measures a persons weight in relation to their height.   According to the [facility] of health (NIH): A BMI of less than 18.5 means that a person is underweight. A BMI of between 18.5 and 24.9 is ideal. A BMI of between 25 and 29.9 is overweight. A BMI over 30 indicates obesity.  Weight Management Required  URGENT: Your weight has been found to be adversely affecting  your health.  Dear Ms. Lagrand:  Your current Estimated body mass index is 64.3 kg/m as calculated from the following:   Height as of 04/29/22: 5' 3" (1.6 m).   Weight as of 04/29/22: 363 lb (164.7 kg).  Please use the table below to identify your weight category and associated incidence of chronic pain, secondary to your weight.  Body Mass Index (BMI) Classification BMI level (kg/m2) Category Associated incidence of chronic pain  <18  Underweight   18.5-24.9 Ideal body weight   25-29.9 Overweight  20%  30-34.9 Obese (Class I)  68%  35-39.9 Severe obesity (Class II)  136%  >40 Extreme obesity (Class III)  254%   In addition: You will be considered "Morbidly Obese", if your BMI is above 30 and you have one or more of the following conditions which are known to be caused and/or directly associated with obesity: 1.    Type 2 Diabetes (Which in turn can lead to cardiovascular diseases (CVD), stroke, peripheral vascular diseases (PVD), retinopathy, nephropathy, and neuropathy) 2.    Cardiovascular Disease (High Blood Pressure; Congestive Heart Failure; High Cholesterol; Coronary Artery Disease; Angina; or History of Heart Attacks) 3.    Breathing problems (Asthma; obesity-hypoventilation syndrome; obstructive sleep apnea; chronic inflammatory airway disease; reactive airway disease; or shortness of breath) 4.    Chronic kidney disease 5.    Liver disease (nonalcoholic fatty liver disease) 6.    High blood pressure 7.    Acid reflux (gastroesophageal reflux disease; heartburn) 8.    Osteoarthritis (OA) (with any of the following: hip pain; knee pain; and/or low back pain) 9.    Low back pain (Lumbar Facet Syndrome; and/or Degenerative Disc Disease) 10.  Hip pain (Osteoarthritis of hip) (For every 1 lbs  of added body weight, there is a 2 lbs increase in pressure inside of each hip articulation. 1:2 mechanical relationship) 11.  Knee pain (Osteoarthritis of knee) (For every 1 lbs of added body  weight, there is a 4 lbs increase in pressure inside of each knee articulation. 1:4 mechanical relationship) (patients with a BMI>30 kg/m2 were 6.8 times more likely to develop knee OA than normal-weight individuals) 12.  Cancer: Epidemiological studies have shown that obesity is a risk factor for: post-menopausal breast cancer; cancers of the endometrium, colon and kidney cancer; malignant adenomas of the oesophagus. Obese subjects have an approximately 1.5-3.5-fold increased risk of developing these cancers compared with normal-weight subjects, and it has been estimated that between 15 and 45% of these cancers can be attributed to overweight. More recent studies suggest that obesity may also increase the risk of other types of cancer, including pancreatic, hepatic and gallbladder cancer. (Ref: Obesity and cancer. Pischon T, Nthlings U, [employer] H. Proc Nutr Soc. 2008 May;67(2):128-45. doi: 38.8828/M0349179150569794.) The International Agency for Research on Cancer (IARC) has identified 13 cancers associated with overweight and obesity: meningioma, multiple myeloma, adenocarcinoma of the esophagus, and cancers of the thyroid, postmenopausal breast cancer, gallbladder, stomach, liver, pancreas, kidney, ovaries, uterus, colon and rectal (colorectal) cancers. 77 percent of all cancers diagnosed in women and 24 percent of those diagnosed in men are associated with overweight and obesity.  Recommendation: At this point it is urgent that you take a step back and concentrate in loosing weight. Dedicate 100% of your efforts on this task. Nothing else will improve your health more than bringing your weight down and your BMI to less than 30. If you are here, you probably have chronic pain. We know that most chronic pain patients have difficulty exercising secondary to their pain. For this reason, you must rely on proper nutrition and diet in order to lose the weight. If your BMI is above 40, you should seriously consider  bariatric surgery. A realistic goal is to lose 10% of your body weight over a period of 12 months.  Be honest to yourself, if over time you have unsuccessfully tried to lose weight, then it is time for you to seek professional help and to enter a medically supervised weight management program, and/or undergo bariatric surgery. Stop procrastinating.   Pain management considerations:  1.    Pharmacological Problems: Be advised that the use of opioid analgesics (oxycodone; hydrocodone; morphine; methadone; codeine; and all of their derivatives) have been associated with decreased metabolism and weight gain.  For this reason, should we see that you are unable to lose weight while taking these medications, it may become necessary for [place] to taper down and indefinitely discontinue them.  2.    Technical Problems: The incidence of successful interventional therapies decreases as the patient's BMI increases. It is much more difficult to accomplish a safe and effective interventional therapy on a patient with a BMI above 35. 3.    Radiation Exposure Problems: The x-rays machine, used to accomplish injection therapies, will automatically increase their x-ray output in order to capture an appropriate bone image. This means that radiation exposure increases exponentially with the patient's BMI. (The higher the BMI, the higher the radiation exposure.) Although the level of radiation used at a given time is still safe to the patient, it is not for the physician and/or assisting staff. Unfortunately, radiation exposure is accumulative. Because physicians and the staff have to do procedures and be exposed on a daily basis, this  can result in health problems such as cancer and radiation burns. Radiation exposure to the staff is monitored by the radiation batches that they wear. The exposure levels are reported back to the staff on a quarterly basis. Depending on levels of exposure, physicians and staff may be obligated by law to  decrease this exposure. This means that they have the right and obligation to refuse providing therapies where they may be overexposed to radiation. For this reason, physicians may decline to offer therapies such as radiofrequency ablation or implants to patients with a BMI above 40. 4.    Current Trends: Be advised that the current trend is to no longer offer certain therapies to patients with a BMI equal to, or above 35, due to increase perioperative risks, increased technical procedural difficulties, and excessive radiation exposure to healthcare personnel.  ______________________________________________________________________________________________

## 2022-05-20 ENCOUNTER — Telehealth: Payer: Self-pay | Admitting: Pain Medicine

## 2022-05-20 NOTE — Telephone Encounter (Signed)
PT stated that she started taking an steroid pill on today. The steroid pill is due to her having shortness of breath. PT stated that it's only for 4 days. PT will like to know if she needs to cancel appt that's scheduled for 05-25-22. Please give patient a call. Thanks

## 2022-05-20 NOTE — Telephone Encounter (Signed)
Patient is having SYNVISC INJECTION. Steroids should not affect. Patient called and made aware.

## 2022-05-21 ENCOUNTER — Ambulatory Visit: Payer: Medicare HMO | Admitting: Orthopedic Surgery

## 2022-05-23 NOTE — Patient Instructions (Signed)

## 2022-05-23 NOTE — Progress Notes (Unsigned)
PROVIDER NOTE: Information contained herein reflects review and annotations entered in association with encounter. Interpretation of such information and data should be left to medically-trained personnel. Information provided to patient can be located elsewhere in the medical record under "Patient Instructions". Document created using STT-dictation technology, any transcriptional errors that may result from process are unintentional.    Patient: Karen Lewis  Service Category: E/M  Provider: Gaspar Cola, MD  DOB: [birth year]  DOS: 05/24/2022  Referring Provider: Idelle Crouch, MD  MRN: [id number]  Specialty: Interventional Pain Management  PCP: Idelle Crouch, MD  Type: Established Patient  Setting: Ambulatory outpatient    Location: Office  Delivery: Face-to-face     HPI  [name], a 51 y.o. year old female, is here today because of her Chronic pain syndrome [G89.4]. Karen Lewis primary complain today is No chief complaint on file. Last encounter: My last encounter with her was on 05/20/2022. Pertinent problems: Karen Lewis has Fibromyalgia; Chronic knee pain (1ry area of Pain) (Bilateral) (R>L); Chronic low back pain (3ry area of Pain) (Bilateral) (R>L) w/o sciatica; Lumbar facet syndrome (Bilateral) (R>L); Secondary osteoarthritis of multiple sites; Grade 1  Anterolisthesis of L3/L4 & L4/L5 (1.4 cm); Chronic hip pain (2ry area of Pain) (Right); S/P THR (total hip replacement) (Left); Lumbar spondylosis; Chronic pain syndrome; Neurogenic pain; Upper extremity pain; Chronic shoulder pain (Left); Osteoarthritis of shoulder (Left); Osteoarthritis of hip (Right); Secondary Osteoarthritis of knee (Bilateral) (R>L); Spondylosis without myelopathy or radiculopathy, lumbosacral region; Panniculitis; Leg swelling; Edema of both legs; History of total hip replacement (Bilateral); S/P THR (total hip replacement) (Right) (11/27/2019); Pain and numbness of left upper extremity; Cervical radiculitis  (Left); Bilateral primary osteoarthritis of knee; DDD (degenerative disc disease), lumbosacral; Osteoarthritis of lumbar spine without myelopathy or radiculopathy; Lumbosacral radiculopathy at S1 (Left); Tricompartment osteoarthritis of knees (Bilateral); Lumbosacral radiculopathy at L2 (Right); Abnormal MRI, lumbar spine (11/10/2021); Chronic thigh pain (Right); Weakness of leg (Right); Lumbar interspinous bursitis (L2-L5); Spinal enthesopathy of lumbar region [facility]); Lumbar facet arthropathy (Multilevel) (Bilateral); Lumbar central spinal stenosis, w/o claudication (L2-3, L4-5, L5-S1); Lumbar foraminal stenosis (Bilateral: L2-3, L4-5) (L>R); and Lumbosacral lateral recess stenosis (Bilateral: L4-5, L5-S1) on their pertinent problem list. Pain Assessment: Severity of   is reported as a  /10. Location:    / . Onset:  . Quality:  . Timing:  . Modifying factor(s):  Marland Kitchen Vitals:  vitals were not taken for this visit.   Reason for encounter: medication management. ***  RTCB: 08/26/2022 Nonopioid transferred 06/24/2020: Lyrica  Pharmacotherapy Assessment  Analgesic: Oxycodone IR 5 mg 1 tab PO 5X/day (#150/mo) (25 mg/day) MME/day: 37.5 mg/day.   Monitoring: [place] PMP: PDMP reviewed during this encounter.       Pharmacotherapy: No side-effects or adverse reactions reported. Compliance: No problems identified. Effectiveness: Clinically acceptable.  No notes on file  No results found for: "CBDTHCR" No results found for: "D8THCCBX" No results found for: "D9THCCBX"  UDS:  Summary  Date Value Ref Range Status  10/26/2021 Note  Final    Comment:    ==================================================================== [employer] Select 13 (MW) ==================================================================== Test                             Result       Flag       Units  Drug Present and Declared for Prescription Verification   7-aminoclonazepam  48           EXPECTED   ng/mg creat     7-aminoclonazepam is an expected metabolite of clonazepam. Source of    clonazepam is a scheduled prescription medication.    Noroxycodone                   518          EXPECTED   ng/mg creat    Noroxycodone is an expected metabolite of oxycodone. Sources of    oxycodone include scheduled prescription medications.  Drug Absent but Declared for Prescription Verification   Oxycodone                      Not Detected UNEXPECTED ng/mg creat    Oxycodone is almost always present in patients taking this drug    consistently.  Absence of oxycodone could be due to lapse of time    since the last dose or unusual pharmacokinetics (rapid metabolism).    Butalbital                     Not Detected UNEXPECTED ==================================================================== Test                      Result    Flag   Units      Ref Range   Creatinine              88               mg/dL      >=20 ==================================================================== Declared Medications:  The flagging and interpretation on this report are based on the  following declared medications.  Unexpected results may arise from  inaccuracies in the declared medications.   **Note: The testing scope of this panel includes these medications:   Butalbital (Fioricet)  Clonazepam (Klonopin)  Oxycodone   **Note: The testing scope of this panel does not include the  following reported medications:   Acetaminophen (Tylenol)  Acetaminophen (Fioricet)  Albuterol (Ventolin HFA)  Amitriptyline (Elavil)  Benzonatate (Tessalon)  Biotin  Caffeine (Fioricet)  Calcium (Tums)  Diclofenac (Voltaren)  Diphenhydramine (Benadryl)  Dupilumab (Dupixent)  Epinephrine  Eye Drop  Famotidine (Pepcid)  Fluticasone (Breo)  Furosemide (Lasix)  Guaifenesin (Robitussin)  Levocetirizine (Xyzal)  Levothyroxine (Synthroid)  Lisinopril (Zestril)  Magnesium (Mag-Ox)  Meclizine (Antivert)  Metformin (Glucophage)  Metoprolol  (Lopressor)  Montelukast (Singulair)  Multivitamin  Naloxone (Narcan)  Naproxen (Aleve)  Nitrofurantoin (Macrobid)  Omeprazole (Prilosec)  Pregabalin (Lyrica)  Probiotic  Quetiapine (Seroquel)  Rizatriptan (Maxalt)  Rosuvastatin (Crestor)  Semaglutide (Ozempic)  Spironolactone (Aldactone)  Tizanidine (Zanaflex)  Vilanterol (Breo)  Vitamin B12  Vitamin C  Vitamin D2  Zolpidem (Ambien) ==================================================================== For clinical consultation, please call [phone]. ====================================================================       ROS  Constitutional: Denies any fever or chills Gastrointestinal: No reported hemesis, hematochezia, vomiting, or acute GI distress Musculoskeletal: Denies any acute onset joint swelling, redness, loss of ROM, or weakness Neurological: No reported episodes of acute onset apraxia, aphasia, dysarthria, agnosia, amnesia, paralysis, loss of coordination, or loss of consciousness  Medication Review  B-12, Biotin, Dupilumab, EPINEPHrine, Galcanezumab-gnlm, QUEtiapine, Semaglutide(0.25 or 0.5MG/DOS), Tab-A-Vite, acetaminophen, acidophilus, albuterol, amitriptyline, butalbital-acetaminophen-caffeine, calcium carbonate, clonazePAM, diclofenac sodium, diphenhydrAMINE, ergocalciferol, famotidine, fluticasone furoate-vilanterol, furosemide, glucose blood, levocetirizine, levothyroxine, lisinopril, magnesium oxide, meclizine, metFORMIN, metoprolol tartrate, montelukast, naphazoline-pheniramine, naproxen sodium, nitrofurantoin (macrocrystal-monohydrate), omeprazole, oxyCODONE, pregabalin, rizatriptan, rosuvastatin, sertraline, spironolactone, tiZANidine, topiramate, vitamin C, and zolpidem  History  Review  Allergy: Karen Lewis is allergic to bactrim [sulfamethoxazole-trimethoprim], omalizumab, ciprofloxacin, shellfish allergy, and nsaids. Drug: Karen Lewis  reports no history of drug use. Alcohol:  reports no history of  alcohol use. Tobacco:  reports that she has never smoked. She has never used smokeless tobacco. Social: Karen Lewis  reports that she has never smoked. She has never used smokeless tobacco. She reports that she does not drink alcohol and does not use drugs. Medical:  has a past medical history of Anemia, Anginal pain ([place]), Anxiety, Arthralgia of hip (07/29/2015), Arthritis, Arthritis, degenerative (07/29/2015), Asthma, Cephalalgia (07/25/2014), Dependence on unknown drug ([place]), Depression, Diabetes mellitus without complication ([place]), Difficult intubation, Dysrhythmia, Eczema, Fibromyalgia, Gastritis, GERD (gastroesophageal reflux disease), Gonalgia (07/29/2015), Gout, H/O cardiovascular disorder (03/10/2015), H/O surgical procedure (12/05/2012), H/O thyroid disease (03/10/2015), Headache, Herpes, History of artificial joint (07/29/2015), History of hiatal hernia, Hypertension, Hypomagnesemia, Hypothyroidism, IDA (iron deficiency anemia) (05/28/2019), LBP (low back pain) (07/29/2015), Neuromuscular disorder ([place]), Obesity, PCOS (polycystic ovarian syndrome), Primary osteoarthritis of both knees (07/29/2015), Sleep apnea, Thyroid nodule (bilateral), and Umbilical hernia. Surgical: Karen Lewis  has a past surgical history that includes Laparoscopic partial gastrectomy; Shoulder arthroscopy (Right); Carpal tunnel release (Bilateral); Diagnostic laparoscopy; Cholecystectomy; Trigger finger release (Right); Thyroidectomy (N/A, 11/12/2015); left trigger finger; Roux-en-Y Gastric Bypass (06/03/2017); Hiatal hernia repair; peniculectomy (N/A, 07/05/2018); Total hip arthroplasty (Right, 11/27/2019); Joint replacement (Bilateral, hip); Appendectomy; Trigger finger release (Right, 04/24/2020); Colonoscopy with propofol (N/A, 02/11/2021); and Esophagogastroduodenoscopy (egd) with propofol (N/A, 02/11/2021). Family: family history includes Alcohol abuse in her father and mother; Anxiety disorder in her father and mother; Brain cancer (age  of onset: 68) in her paternal uncle; Breast cancer (age of onset: 27) in her paternal aunt and paternal aunt; COPD in her father; Depression in her brother, father, and mother; Diabetes in her brother, father, and mother; Hypertension in her brother, father, and mother; Kidney cancer in her mother; Kidney failure in her father; Post-traumatic stress disorder in her father; Sleep apnea in her brother, father, and mother.  Laboratory Chemistry Profile   Renal Lab Results  Component Value Date   BUN 19 04/28/2021   CREATININE 1.06 (H) 04/28/2021   GFRAA >60 04/16/2020   GFRNONAA >60 04/28/2021    Hepatic Lab Results  Component Value Date   AST 34 04/22/2021   ALT 34 04/22/2021   ALBUMIN 4.1 04/22/2021   ALKPHOS 63 04/22/2021    Electrolytes Lab Results  Component Value Date   NA 134 (L) 04/28/2021   K 4.5 04/28/2021   CL 101 04/28/2021   CALCIUM 8.4 (L) 04/28/2021   MG 2.1 04/28/2021   PHOS 5.5 (H) 05/22/2019    Bone Lab Results  Component Value Date   VD25OH 27.4 (L) 11/24/2015   VD125OH2TOT 41.5 11/24/2015    Inflammation (CRP: Acute Phase) (ESR: Chronic Phase) Lab Results  Component Value Date   CRP 0.7 09/27/2019   ESRSEDRATE 30 (H) 09/27/2019         Note: Above Lab results reviewed.  Recent Imaging Review  DG PAIN CLINIC C-ARM 1-60 MIN NO REPORT Fluoro was used, but no Radiologist interpretation will be provided.  Please refer to "NOTES" tab for provider progress note. Note: Reviewed        Physical Exam  General appearance: Well nourished, well developed, and well hydrated. In no apparent acute distress Mental status: Alert, oriented x 3 (person, place, & time)       Respiratory: No evidence of acute respiratory distress Eyes: PERLA Vitals: There  were no vitals taken for this visit. BMI: Estimated body mass index is 64.3 kg/m as calculated from the following:   Height as of 04/29/22: '5\' 3"'  (1.6 m).   Weight as of 04/29/22: 363 lb (164.7 kg). Ideal:  Patient weight not recorded  Assessment   Diagnosis Status  1. Chronic pain syndrome   2. Chronic knee pain (1ry area of Pain) (Bilateral) (R>L)   3. Chronic low back pain (3ry area of Pain) (Bilateral) (R>L) w/o sciatica   4. Secondary osteoarthritis of multiple sites   5. Pharmacologic therapy   6. Chronic use of opiate for therapeutic purpose   7. Encounter for medication management   8. Encounter for chronic pain management    Controlled Controlled Controlled   Updated Problems: No problems updated.  Plan of Care  Problem-specific:  No problem-specific Assessment & Plan notes found for this encounter.  Karen Lewis has a current medication list which includes the following long-term medication(s): albuterol, clonazepam, diphenhydramine, furosemide, levothyroxine, lisinopril, magnesium oxide, metformin, metoprolol tartrate, montelukast, oxycodone, pregabalin, rizatriptan, rizatriptan, and spironolactone.  Pharmacotherapy (Medications Ordered): No orders of the defined types were placed in this encounter.  Orders:  No orders of the defined types were placed in this encounter.  Follow-up plan:   No follow-ups on file.     Interventional Therapies  Risk  Complexity Considerations:   Estimated body mass index is 60.59 kg/m as calculated from the following:   Height as of 06/18/21: '5\' 4"'  (1.626 m).   Weight as of 06/18/21: 353 lb (160.1 kg). NOTE: NO RFA until BMI less or equal to 35.  (Gastric bypass done on 06/03/2017) Iodine allergy  Contrast dye allergy  Shellfish allergy    Planned  Pending:   Therapeutic bilateral IA Monovisc knee inj. #1    Under consideration:   Diagnostic bilateral genicular NB    Completed:   Therapeutic right L2-3 LESI x1 (04/29/2022) (100/100/60/60)  Therapeutic bilateral IA Zilretta knee injection x4 (03/25/2022) (100/100/80/80)  Therapeutic bilateral IA Hyalgan knee injection x16 (06/24/2020) (100/100/60/60)  Palliative/therapeutic  bilateral lumbar facet MBB x8 (08/25/2021) (100/100/60/60)  Therapeutic right IA hip injection x3 (07/07/2017)  Therapeutic left IA shoulder (glenohumeral joint) injection x1 (05/31/2017)    Completed by other providers:   Therapeutic bilateral THR (total hip replacements) (Dr. Rudene Christians) (Right: 11/27/2019) (Left: 07/29/2015)    Therapeutic  Palliative (PRN) options:   Palliative bilateral knee injections  Palliative bilateral lumbar facet MBBs        Recent Visits Date Type Provider Dept  05/13/22 Office Visit Milinda Pointer, MD [facility]  04/29/22 Procedure visit Milinda Pointer, MD [facility]  04/13/22 Office Visit Milinda Pointer, MD [facility]  03/25/22 Procedure visit Milinda Pointer, MD [facility]  Showing recent visits within past 90 days and meeting all other requirements Future Appointments Date Type Provider Dept  05/24/22 Appointment Milinda Pointer, [facility]  05/25/22 Appointment Milinda Pointer, MD [facility]  Showing future appointments within next 90 days and meeting all other requirements  I discussed the assessment and treatment plan with the patient. The patient was provided an opportunity to ask questions and all were answered. The patient agreed with the plan and demonstrated an understanding of the instructions.  Patient advised to call back or seek an in-person evaluation if the symptoms or condition worsens.  Duration of encounter: *** minutes.  Total time on encounter, as per AMA guidelines included both the face-to-face and non-face-to-face  time personally spent by the physician and/or other qualified health care professional(s) on the day of the encounter (includes time in activities that require the physician or other qualified health care professional and does not include time in activities normally performed by clinical staff). Physician's time may include the following  activities when performed: preparing to see the patient (eg, review of tests, pre-charting review of records) obtaining and/or reviewing separately obtained history performing a medically appropriate examination and/or evaluation counseling and educating the patient/family/caregiver ordering medications, tests, or procedures referring and communicating with other health care professionals (when not separately reported) documenting clinical information in the electronic or other health record independently interpreting results (not separately reported) and communicating results to the patient/ family/caregiver care coordination (not separately reported)  Note by: Gaspar Cola, MD Date: 05/24/2022; Time: 6:42 PM

## 2022-05-24 ENCOUNTER — Encounter: Payer: Self-pay | Admitting: Pain Medicine

## 2022-05-24 ENCOUNTER — Ambulatory Visit: Payer: Medicare HMO | Attending: Pain Medicine | Admitting: Pain Medicine

## 2022-05-24 VITALS — BP 108/83 | HR 86 | Temp 98.3°F | Resp 16 | Ht 63.0 in | Wt 363.0 lb

## 2022-05-24 DIAGNOSIS — M25562 Pain in left knee: Secondary | ICD-10-CM | POA: Insufficient documentation

## 2022-05-24 DIAGNOSIS — G8929 Other chronic pain: Secondary | ICD-10-CM | POA: Insufficient documentation

## 2022-05-24 DIAGNOSIS — M170 Bilateral primary osteoarthritis of knee: Secondary | ICD-10-CM | POA: Diagnosis not present

## 2022-05-24 DIAGNOSIS — M174 Other bilateral secondary osteoarthritis of knee: Secondary | ICD-10-CM | POA: Insufficient documentation

## 2022-05-24 DIAGNOSIS — G894 Chronic pain syndrome: Secondary | ICD-10-CM | POA: Diagnosis not present

## 2022-05-24 DIAGNOSIS — M25561 Pain in right knee: Secondary | ICD-10-CM | POA: Insufficient documentation

## 2022-05-24 DIAGNOSIS — Z79891 Long term (current) use of opiate analgesic: Secondary | ICD-10-CM | POA: Insufficient documentation

## 2022-05-24 DIAGNOSIS — M545 Low back pain, unspecified: Secondary | ICD-10-CM | POA: Insufficient documentation

## 2022-05-24 DIAGNOSIS — Z79899 Other long term (current) drug therapy: Secondary | ICD-10-CM | POA: Diagnosis not present

## 2022-05-24 DIAGNOSIS — M25511 Pain in right shoulder: Secondary | ICD-10-CM | POA: Diagnosis not present

## 2022-05-24 DIAGNOSIS — M153 Secondary multiple arthritis: Secondary | ICD-10-CM | POA: Insufficient documentation

## 2022-05-24 DIAGNOSIS — Z6841 Body Mass Index (BMI) 40.0 and over, adult: Secondary | ICD-10-CM | POA: Diagnosis not present

## 2022-05-24 MED ORDER — OXYCODONE HCL 5 MG PO TABS: 5.0000 mg | ORAL_TABLET | Freq: Three times a day (TID) | ORAL | 0 refills | Status: DC | PRN

## 2022-05-24 NOTE — Progress Notes (Signed)
Nursing Pain Medication Assessment:  Safety precautions to be maintained throughout the outpatient stay will include: orient to surroundings, keep bed in low position, maintain call bell within reach at all times, provide assistance with transfer out of bed and ambulation.  Medication Inspection Compliance: Pill count conducted under aseptic conditions, in front of the patient. Neither the pills nor the bottle was removed from the patient's sight at any time. Once count was completed pills were immediately returned to the patient in their original bottle.  Medication: Oxycodone IR Pill/Patch Count:  22 of 90 pills remain Pill/Patch Appearance: Markings consistent with prescribed medication Bottle Appearance: Standard pharmacy container. Clearly labeled. Filled Date: 09 / 16 / 2023 Last Medication intake:  Today

## 2022-05-25 ENCOUNTER — Encounter: Payer: Self-pay | Admitting: Pain Medicine

## 2022-05-25 ENCOUNTER — Ambulatory Visit (HOSPITAL_BASED_OUTPATIENT_CLINIC_OR_DEPARTMENT_OTHER): Payer: Medicare HMO | Admitting: Pain Medicine

## 2022-05-25 ENCOUNTER — Ambulatory Visit
Admission: RE | Admit: 2022-05-25 | Discharge: 2022-05-25 | Disposition: A | Discharge status: Home / Self Care | Payer: Medicare HMO | Source: Ambulatory Visit | Attending: Pain Medicine | Admitting: Pain Medicine

## 2022-05-25 VITALS — BP 153/83 | HR 78 | Temp 97.2°F | Resp 21 | Ht 64.0 in | Wt 363.0 lb

## 2022-05-25 DIAGNOSIS — G8929 Other chronic pain: Secondary | ICD-10-CM | POA: Diagnosis not present

## 2022-05-25 DIAGNOSIS — M174 Other bilateral secondary osteoarthritis of knee: Secondary | ICD-10-CM | POA: Diagnosis not present

## 2022-05-25 DIAGNOSIS — M25562 Pain in left knee: Secondary | ICD-10-CM | POA: Diagnosis not present

## 2022-05-25 DIAGNOSIS — G894 Chronic pain syndrome: Secondary | ICD-10-CM | POA: Diagnosis not present

## 2022-05-25 DIAGNOSIS — Z6841 Body Mass Index (BMI) 40.0 and over, adult: Secondary | ICD-10-CM | POA: Diagnosis not present

## 2022-05-25 DIAGNOSIS — M170 Bilateral primary osteoarthritis of knee: Secondary | ICD-10-CM

## 2022-05-25 DIAGNOSIS — M25561 Pain in right knee: Secondary | ICD-10-CM | POA: Diagnosis not present

## 2022-05-25 DIAGNOSIS — M25511 Pain in right shoulder: Secondary | ICD-10-CM

## 2022-05-25 MED ORDER — HYALURONAN 88 MG/4ML IX SOSY
88.0000 mg | PREFILLED_SYRINGE | Freq: Once | INTRA_ARTICULAR | Status: AC
  Administered 2022-05-25: 88 mg via INTRA_ARTICULAR
  Filled 2022-05-25: qty 4

## 2022-05-25 MED ORDER — MIDAZOLAM HCL 5 MG/5ML IJ SOLN
0.5000 mg | Freq: Once | INTRAMUSCULAR | Status: AC
  Administered 2022-05-25: 2 mg via INTRAVENOUS

## 2022-05-25 MED ORDER — LACTATED RINGERS IV SOLN: Freq: Once | INTRAVENOUS | Status: AC

## 2022-05-25 MED ORDER — TRIAMCINOLONE ACETONIDE 40 MG/ML IJ SUSP
INTRAMUSCULAR | Status: AC
  Filled 2022-05-25: qty 1

## 2022-05-25 MED ORDER — PENTAFLUOROPROP-TETRAFLUOROETH EX AERO
INHALATION_SPRAY | Freq: Once | CUTANEOUS | Status: AC
  Administered 2022-05-25: 30 via TOPICAL

## 2022-05-25 MED ORDER — MIDAZOLAM HCL 5 MG/5ML IJ SOLN
INTRAMUSCULAR | Status: AC
  Filled 2022-05-25: qty 5

## 2022-05-25 MED ORDER — ROPIVACAINE HCL 2 MG/ML IJ SOLN
INTRAMUSCULAR | Status: AC
  Filled 2022-05-25: qty 20

## 2022-05-25 MED ORDER — LIDOCAINE HCL 2 % IJ SOLN
INTRAMUSCULAR | Status: AC
  Filled 2022-05-25: qty 20

## 2022-05-25 MED ORDER — ROPIVACAINE HCL 2 MG/ML IJ SOLN
9.0000 mL | Freq: Once | INTRAMUSCULAR | Status: AC
  Administered 2022-05-25: 20 mL

## 2022-05-25 MED ORDER — LIDOCAINE HCL 2 % IJ SOLN
20.0000 mL | Freq: Once | INTRAMUSCULAR | Status: AC
  Administered 2022-05-25: 400 mg

## 2022-05-25 MED ORDER — TRIAMCINOLONE ACETONIDE 40 MG/ML IJ SUSP
40.0000 mg | Freq: Once | INTRAMUSCULAR | Status: AC
  Administered 2022-05-25: 40 mg

## 2022-05-25 NOTE — Addendum Note (Signed)
Addended by: Milinda Pointer A on: 05/25/2022 01:02 PM   Modules accepted: Orders

## 2022-05-25 NOTE — Patient Instructions (Signed)
______________________________________________________________________________________________  Body mass index (BMI)  Body mass index (BMI) is a common tool for deciding whether a person has an appropriate body weight.  It measures a persons weight in relation to their height.   According to the [facility] of health (NIH): A BMI of less than 18.5 means that a person is underweight. A BMI of between 18.5 and 24.9 is ideal. A BMI of between 25 and 29.9 is overweight. A BMI over 30 indicates obesity.  Weight Management Required  URGENT: Your weight has been found to be adversely affecting your health.  Dear Karen Lewis:  Your current Estimated body mass index is 64.3 kg/m as calculated from the following:   Height as of 05/24/22: _0  (1.6 m).   Weight as of 05/24/22: 363 lb (164.7 kg).  Please use the table below to identify your weight category and associated incidence of chronic pain, secondary to your weight.  Body Mass Index (BMI) Classification BMI level (kg/m2) Category Associated incidence of chronic pain  <18  Underweight   18.5-24.9 Ideal body weight   25-29.9 Overweight  20%  30-34.9 Obese (Class I)  68%  35-39.9 Severe obesity (Class II)  136%  >40 Extreme obesity (Class III)  254%   In addition: You will be considered "Morbidly Obese", if your BMI is above 30 and you have one or more of the following conditions which are known to be caused and/or directly associated with obesity: 1.    Type 2 Diabetes (Which in turn can lead to cardiovascular diseases (CVD), stroke, peripheral vascular diseases (PVD), retinopathy, nephropathy, and neuropathy) 2.    Cardiovascular Disease (High Blood Pressure; Congestive Heart Failure; High Cholesterol; Coronary Artery Disease; Angina; or History of Heart Attacks) 3.    Breathing problems (Asthma; obesity-hypoventilation syndrome; obstructive sleep apnea; chronic inflammatory airway disease; reactive airway disease; or shortness of  breath) 4.    Chronic kidney disease 5.    Liver disease (nonalcoholic fatty liver disease) 6.    High blood pressure 7.    Acid reflux (gastroesophageal reflux disease; heartburn) 8.    Osteoarthritis (OA) (with any of the following: hip pain; knee pain; and/or low back pain) 9.    Low back pain (Lumbar Facet Syndrome; and/or Degenerative Disc Disease) 10.  Hip pain (Osteoarthritis of hip) (For every 1 lbs of added body weight, there is a 2 lbs increase in pressure inside of each hip articulation. 1:2 mechanical relationship) 11.  Knee pain (Osteoarthritis of knee) (For every 1 lbs of added body weight, there is a 4 lbs increase in pressure inside of each knee articulation. 1:4 mechanical relationship) (patients with a BMI>30 kg/m2 were 6.8 times more likely to develop knee OA than normal-weight individuals) 12.  Cancer: Epidemiological studies have shown that obesity is a risk factor for: post-menopausal breast cancer; cancers of the endometrium, colon and kidney cancer; malignant adenomas of the oesophagus. Obese subjects have an approximately 1.5-3.5-fold increased risk of developing these cancers compared with normal-weight subjects, and it has been estimated that between 15 and 45% of these cancers can be attributed to overweight. More recent studies suggest that obesity may also increase the risk of other types of cancer, including pancreatic, hepatic and gallbladder cancer. (Ref: Obesity and cancer. Pischon T, Nthlings U, [employer] H. Proc Nutr Soc. 2008 May;67(2):128-45. doi: 54.6270/J5009381829937169.) The International Agency for Research on Cancer (IARC) has identified 13 cancers associated with overweight and obesity: meningioma, multiple myeloma, adenocarcinoma of the esophagus, and cancers of the thyroid,  postmenopausal breast cancer, gallbladder, stomach, liver, pancreas, kidney, ovaries, uterus, colon and rectal (colorectal) cancers. 31 percent of all cancers diagnosed in women and 24 percent  of those diagnosed in men are associated with overweight and obesity.  Recommendation: At this point it is urgent that you take a step back and concentrate in loosing weight. Dedicate 100% of your efforts on this task. Nothing else will improve your health more than bringing your weight down and your BMI to less than 30. If you are here, you probably have chronic pain. We know that most chronic pain patients have difficulty exercising secondary to their pain. For this reason, you must rely on proper nutrition and diet in order to lose the weight. If your BMI is above 40, you should seriously consider bariatric surgery. A realistic goal is to lose 10% of your body weight over a period of 12 months.  Be honest to yourself, if over time you have unsuccessfully tried to lose weight, then it is time for you to seek professional help and to enter a medically supervised weight management program, and/or undergo bariatric surgery. Stop procrastinating.   Pain management considerations:  1.    Pharmacological Problems: Be advised that the use of opioid analgesics (oxycodone; hydrocodone; morphine; methadone; codeine; and all of their derivatives) have been associated with decreased metabolism and weight gain.  For this reason, should we see that you are unable to lose weight while taking these medications, it may become necessary for [place] to taper down and indefinitely discontinue them.  2.    Technical Problems: The incidence of successful interventional therapies decreases as the patient's BMI increases. It is much more difficult to accomplish a safe and effective interventional therapy on a patient with a BMI above 35. 3.    Radiation Exposure Problems: The x-rays machine, used to accomplish injection therapies, will automatically increase their x-ray output in order to capture an appropriate bone image. This means that radiation exposure increases exponentially with the patient's BMI. (The higher the BMI, the higher  the radiation exposure.) Although the level of radiation used at a given time is still safe to the patient, it is not for the physician and/or assisting staff. Unfortunately, radiation exposure is accumulative. Because physicians and the staff have to do procedures and be exposed on a daily basis, this can result in health problems such as cancer and radiation burns. Radiation exposure to the staff is monitored by the radiation batches that they wear. The exposure levels are reported back to the staff on a quarterly basis. Depending on levels of exposure, physicians and staff may be obligated by law to decrease this exposure. This means that they have the right and obligation to refuse providing therapies where they may be overexposed to radiation. For this reason, physicians may decline to offer therapies such as radiofrequency ablation or implants to patients with a BMI above 40. 4.    Current Trends: Be advised that the current trend is to no longer offer certain therapies to patients with a BMI equal to, or above 35, due to increase perioperative risks, increased technical procedural difficulties, and excessive radiation exposure to healthcare personnel.  ______________________________________________________________________________________________   ____________________________________________________________________________________________  Post-Procedure Discharge Instructions  Instructions: Apply ice:  Purpose: This will minimize any swelling and discomfort after procedure.  When: Day of procedure, as soon as you get home. How: Fill a plastic sandwich bag with crushed ice. Cover it with a small towel and apply to injection site. How long: (15  min on, 15 min off) Apply for 15 minutes then remove x 15 minutes.  Repeat sequence on day of procedure, until you go to bed. Apply heat:  Purpose: To treat any soreness and discomfort from the procedure. When: Starting the next day after the  procedure. How: Apply heat to procedure site starting the day following the procedure. How long: May continue to repeat daily, until discomfort goes away. Food intake: Start with clear liquids (like water) and advance to regular food, as tolerated.  Physical activities: Keep activities to a minimum for the first 8 hours after the procedure. After that, then as tolerated. Driving: If you have received any sedation, be responsible and do not drive. You are not allowed to drive for 24 hours after having sedation. Blood thinner: (Applies only to those taking blood thinners) You may restart your blood thinner 6 hours after your procedure. Insulin: (Applies only to Diabetic patients taking insulin) As soon as you can eat, you may resume your normal dosing schedule. Infection prevention: Keep procedure site clean and dry. Shower daily and clean area with soap and water. Post-procedure Pain Diary: Extremely important that this be done correctly and accurately. Recorded information will be used to determine the next step in treatment. For the purpose of accuracy, follow these rules: Evaluate only the area treated. Do not report or include pain from an untreated area. For the purpose of this evaluation, ignore all other areas of pain, except for the treated area. After your procedure, avoid taking a long nap and attempting to complete the pain diary after you wake up. Instead, set your alarm clock to go off every hour, on the hour, for the initial 8 hours after the procedure. Document the duration of the numbing medicine, and the relief you are getting from it. Do not go to sleep and attempt to complete it later. It will not be accurate. If you received sedation, it is likely that you were given a medication that may cause amnesia. Because of this, completing the diary at a later time may cause the information to be inaccurate. This information is needed to plan your care. Follow-up appointment: Keep your  post-procedure follow-up evaluation appointment after the procedure (usually 2 weeks for most procedures, 6 weeks for radiofrequencies). DO NOT FORGET to bring you pain diary with you.   Expect: (What should I expect to see with my procedure?) From numbing medicine (AKA: Local Anesthetics): Numbness or decrease in pain. You may also experience some weakness, which if present, could last for the duration of the local anesthetic. Onset: Full effect within 15 minutes of injected. Duration: It will depend on the type of local anesthetic used. On the average, 1 to 8 hours.  From steroids (Applies only if steroids were used): Decrease in swelling or inflammation. Once inflammation is improved, relief of the pain will follow. Onset of benefits: Depends on the amount of swelling present. The more swelling, the longer it will take for the benefits to be seen. In some cases, up to 10 days. Duration: Steroids will stay in the system x 2 weeks. Duration of benefits will depend on multiple posibilities including persistent irritating factors. Side-effects: If present, they may typically last 2 weeks (the duration of the steroids). Frequent: Cramps (if they occur, drink Gatorade and take over-the-counter Magnesium 450-500 mg once to twice a day); water retention with temporary weight gain; increases in blood sugar; decreased immune system response; increased appetite. Occasional: Facial flushing (red, warm cheeks); mood swings; menstrual  changes. Uncommon: Long-term decrease or suppression of natural hormones; bone thinning. (These are more common with higher doses or more frequent use. This is why we prefer that our patients avoid having any injection therapies in other practices.)  Very Rare: Severe mood changes; psychosis; aseptic necrosis. From procedure: Some discomfort is to be expected once the numbing medicine wears off. This should be minimal if ice and heat are applied as instructed.  Call if: (When  should I call?) You experience numbness and weakness that gets worse with time, as opposed to wearing off. New onset bowel or bladder incontinence. (Applies only to procedures done in the spine)  Emergency Numbers: Durning business hours (Monday - Thursday, 8:00 AM - 4:00 PM) (Friday, 9:00 AM - 12:00 Noon): [phone] After hours: [phone] NOTE: If you are having a problem and are unable connect with, or to talk to a provider, then go to your nearest urgent care or emergency department. If the problem is serious and urgent, please call 911. ____________________________________________________________________________________________

## 2022-05-25 NOTE — Progress Notes (Signed)
Safety precautions to be maintained throughout the outpatient stay will include: orient to surroundings, keep bed in low position, maintain call bell within reach at all times, provide assistance with transfer out of bed and ambulation.  

## 2022-05-25 NOTE — Progress Notes (Addendum)
PROVIDER NOTE: Interpretation of information contained herein should be left to medically-trained personnel. Specific patient instructions are provided elsewhere under "Patient Instructions" section of medical record. This document was created in part using STT-dictation technology, any transcriptional errors that may result from this process are unintentional.  Patient: Karen Lewis Type: Established DOB: 1971-04-19 MRN: [id number] PCP: Idelle Crouch, MD  Service: Procedure DOS: 05/25/2022 Setting: Ambulatory Location: Ambulatory outpatient facility Delivery: Face-to-face Provider: Gaspar Cola, MD Specialty: Interventional Pain Management Specialty designation: 09 Location: Outpatient facility Ref. Prov.: Milinda Pointer, MD      Procedure:          Anesthesia, Analgesia, Anxiolysis:  Procedure #1: Mid trapezius Trigger Point Injection (1-2 muscle groups) #1  CPT: 20552 Primary Purpose:  Diagnostic/therapeutic  Region: Posterior  shoulder Level: Cervico-thoracic Target Area: Trapezius Trigger Point Approach: Percutaneous, ipsilateral approach. Laterality: Right Paraspinal  Type: Local Anesthesia Local Anesthetic: Lidocaine 1-2% Sedation: None  Indication(s):  Analgesia Route: Infiltration ([place]/IM) IV Access: N/A   Position: Sitting   1. Trigger point of right shoulder region    Procedure #2: Monovisc Intra-articular Knee Injection #1  Laterality: Bilateral (-50) Level/approach: Lateral Imaging guidance: None required ([id number]) Anesthesia: Local anesthesia (1-2% Lidocaine) Anxiolysis: IV Versed         Sedation:                         DOS: 05/25/2022  Performed by: Gaspar Cola, MD  Purpose: Diagnostic/Therapeutic Indications: Knee arthralgia associated to osteoarthritis of the knee 1. Chronic knee pain (1ry area of Pain) (Bilateral) (R>L)   2. Secondary Osteoarthritis of knee (Bilateral) (R>L)   3. Tricompartment osteoarthritis of knees  (Bilateral)   4. Class 3 severe obesity due to excess calories with serious comorbidity and body mass index (BMI) of 60.0 to 69.9 in adult [facility])    NAS-11 score:  Pre-procedure: 4 /10  Post-procedure: 0-No pain/10     H&P (Pre-op  Assessment)  [name] is a 51 y.o. (year old), female patient, seen today for interventional treatment. She  has a past surgical history that includes Laparoscopic partial gastrectomy; Shoulder arthroscopy (Right); Carpal tunnel release (Bilateral); Diagnostic laparoscopy; Cholecystectomy; Trigger finger release (Right); Thyroidectomy (N/A, 11/12/2015); left trigger finger; Roux-en-Y Gastric Bypass (06/03/2017); Hiatal hernia repair; peniculectomy (N/A, 07/05/2018); Total hip arthroplasty (Right, 11/27/2019); Joint replacement (Bilateral, hip); Appendectomy; Trigger finger release (Right, 04/24/2020); Colonoscopy with propofol (N/A, 02/11/2021); and Esophagogastroduodenoscopy (egd) with propofol (N/A, 02/11/2021). [name] has a current medication list which includes the following prescription(s): accu-chek aviva plus, acetaminophen, acidophilus, albuterol, amitriptyline, vitamin c, biotin, butalbital-acetaminophen-caffeine, calcium carbonate, clonazepam, b-12, diclofenac sodium, diphenhydramine, dupixent, epinephrine, ergocalciferol, famotidine, fluticasone furoate-vilanterol, furosemide, emgality, accu-chek aviva plus, levocetirizine, magnesium oxide, meclizine, metformin, metoprolol tartrate, montelukast, tab-a-vite, naphazoline-pheniramine, naproxen sodium, nitrofurantoin (macrocrystal-monohydrate), omeprazole, [START ON 05/28/2022] oxycodone, [START ON 06/27/2022] oxycodone, [START ON 07/27/2022] oxycodone, ozempic (0.25 or 0.5 mg/dose), quetiapine, rizatriptan, rizatriptan, rosuvastatin, sertraline, spironolactone, tizanidine, topiramate, zolpidem, levothyroxine, lisinopril, and pregabalin. Her primarily concern today is the Knee Pain (both)  She is allergic to bactrim  [sulfamethoxazole-trimethoprim], omalizumab, ciprofloxacin, shellfish allergy, and nsaids.   Last encounter: My last encounter with her was on 05/24/2022. Pertinent problems: [name] has Fibromyalgia; Chronic knee pain (1ry area of Pain) (Bilateral) (R>L); Chronic low back pain (3ry area of Pain) (Bilateral) (R>L) w/o sciatica; Lumbar facet syndrome (Bilateral) (R>L); Secondary osteoarthritis of multiple sites; Grade 1  Anterolisthesis of L3/L4 & L4/L5 (1.4 cm); Chronic hip pain (2ry area  of Pain) (Right); S/P THR (total hip replacement) (Left); Lumbar spondylosis; Chronic pain syndrome; Neurogenic pain; Upper extremity pain; Chronic shoulder pain (Left); Osteoarthritis of shoulder (Left); Osteoarthritis of hip (Right); Secondary Osteoarthritis of knee (Bilateral) (R>L); Spondylosis without myelopathy or radiculopathy, lumbosacral region; Panniculitis; Leg swelling; Edema of both legs; History of total hip replacement (Bilateral); S/P THR (total hip replacement) (Right) (11/27/2019); Pain and numbness of left upper extremity; Cervical radiculitis (Left); Bilateral primary osteoarthritis of knee; DDD (degenerative disc disease), lumbosacral; Osteoarthritis of lumbar spine without myelopathy or radiculopathy; Lumbosacral radiculopathy at S1 (Left); Tricompartment osteoarthritis of knees (Bilateral); Lumbosacral radiculopathy at L2 (Right); Abnormal MRI, lumbar spine (11/10/2021); Chronic thigh pain (Right); Weakness of leg (Right); Lumbar interspinous bursitis (L2-L5); Spinal enthesopathy of lumbar region [facility]); Lumbar facet arthropathy (Multilevel) (Bilateral); Lumbar central spinal stenosis, w/o claudication (L2-3, L4-5, L5-S1); Lumbar foraminal stenosis (Bilateral: L2-3, L4-5) (L>R); Lumbosacral lateral recess stenosis (Bilateral: L4-5, L5-S1); and Trigger point of right shoulder region on their pertinent problem list. Pain Assessment: Severity of Chronic pain is reported as a 4 /10. Location: Knee Left,  Right/Denies. Onset: More than a month ago. Quality: Aching, Burning, Constant, Pressure, Nagging. Timing: Constant. Modifying factor(s): Meds and procedures. Vitals:  height is '5\' 4"'$  (1.626 m) and weight is 363 lb (164.7 kg) (abnormal). Her temperature is 97.2 F (36.2 C) (abnormal). Her blood pressure is 153/83 (abnormal) and her pulse is 78. Her respiration is 21 (abnormal) and oxygen saturation is 100%.   Reason for encounter: "interventional pain management therapy due pain of at least four (4) weeks in duration, with failure to respond and/or inability to tolerate more conservative care.   Site Confirmation: Ms. Rothgeb was asked to confirm the procedure and laterality before marking the site.  Consent: Before the procedure and under the influence of no sedative(s), amnesic(s), or anxiolytics, the patient was informed of the treatment options, risks and possible complications. To fulfill our ethical and legal obligations, as recommended by the American Medical Association's Code of Ethics, I have informed the patient of my clinical impression; the nature and purpose of the treatment or procedure; the risks, benefits, and possible complications of the intervention; the alternatives, including doing nothing; the risk(s) and benefit(s) of the alternative treatment(s) or procedure(s); and the risk(s) and benefit(s) of doing nothing. The patient was provided information about the general risks and possible complications associated with the procedure. These may include, but are not limited to: failure to achieve desired goals, infection, bleeding, organ or nerve damage, allergic reactions, paralysis, and death. In addition, the patient was informed of those risks and complications associated to Spine-related procedures, such as failure to decrease pain; infection (i.e.: Meningitis, epidural or intraspinal abscess); bleeding (i.e.: epidural hematoma, subarachnoid hemorrhage, or any other type of intraspinal or  peri-dural bleeding); organ or nerve damage (i.e.: Any type of peripheral nerve, nerve root, or spinal cord injury) with subsequent damage to sensory, motor, and/or autonomic systems, resulting in permanent pain, numbness, and/or weakness of one or several areas of the body; allergic reactions; (i.e.: anaphylactic reaction); and/or death. Furthermore, the patient was informed of those risks and complications associated with the medications. These include, but are not limited to: allergic reactions (i.e.: anaphylactic or anaphylactoid reaction(s)); adrenal axis suppression; blood sugar elevation that in diabetics may result in ketoacidosis or comma; water retention that in patients with history of congestive heart failure may result in shortness of breath, pulmonary edema, and decompensation with resultant heart failure; weight gain; swelling or edema; medication-induced neural toxicity;  particulate matter embolism and blood vessel occlusion with resultant organ, and/or nervous system infarction; and/or aseptic necrosis of one or more joints. Finally, the patient was informed that Medicine is not an exact science; therefore, there is also the possibility of unforeseen or unpredictable risks and/or possible complications that may result in a catastrophic outcome. The patient indicated having understood very clearly. We have given the patient no guarantees and we have made no promises. Enough time was given to the patient to ask questions, all of which were answered to the patient's satisfaction. Ms. Folse has indicated that she wanted to continue with the procedure. Attestation: I, the ordering provider, attest that I have discussed with the patient the benefits, risks, side-effects, alternatives, likelihood of achieving goals, and potential problems during recovery for the procedure that I have provided informed consent.  Date  Time: 05/25/2022 11:27 AM   Pre-Procedure Preparation  Monitoring: As per clinic  protocol.  Risk Assessment: Vitals:  [id number] body mass index is 62.31 kg/m as calculated from the following:   Height as of this encounter: '5\' 4"'$  (1.626 m).   Weight as of this encounter: 363 lb (164.7 kg)., Rate:81 , BP:124/70, Resp:16, Temp:(!) 97.2 F (36.2 C), SpO2:97 %  Allergies: She is allergic to bactrim [sulfamethoxazole-trimethoprim], omalizumab, ciprofloxacin, shellfish allergy, and nsaids.  Precautions: No additional precautions required  Blood-thinner(s): None at this time  Coagulopathies: Reviewed. None identified.   Active Infection(s): Reviewed. None identified. Ms. Thane is afebrile   Procedure #1: Materials:  Needle(s) Type: Regular needle Gauge: 25G Length: 3.5-in Medication(s): Please see orders for medications and dosing details.  Procedure #2: Location setting: Exam room Position: Sitting w/ knee bent 90 degrees Safety Precautions: Patient was assessed for positional comfort and pressure points before starting the procedure. Prepping solution: DuraPrep (Iodine Povacrylex [0.7% available iodine] and Isopropyl Alcohol, 74% w/w) Prep Area: Entire knee region Approach: percutaneous, just above the tibial plateau, lateral to the infrapatellar tendon. Intended target: Intra-articular knee space Materials: Tray: Block Needle(s): Spinal Qty: 1/side Length: (52m) 3.5-inch Gauge: 22G   Meds ordered this encounter  Medications   lidocaine (XYLOCAINE) 2 % (with pres) injection 400 mg   pentafluoroprop-tetrafluoroeth (GEBAUERS) aerosol   lactated ringers infusion   midazolam (VERSED) 5 MG/5ML injection 0.5-2 mg    Make sure Flumazenil is available in the pyxis when using this medication. If oversedation occurs, administer 0.2 mg IV over 15 sec. If after 45 sec no response, administer 0.2 mg again over 1 min; may repeat at 1 min intervals; not to exceed 4 doses (1 mg)   Hyaluronan (MONOVISC) intra-articular injection 88 mg    Do not substitute for formulary  alternative without first contacting physician for approval. Deliver to facility day before procedure.   Hyaluronan (MONOVISC) intra-articular injection 88 mg    Do not substitute for formulary alternative without first contacting physician for approval. Deliver to facility day before procedure.   ropivacaine (PF) 2 mg/mL (0.2%) (NAROPIN) injection 9 mL   triamcinolone acetonide (KENALOG-40) injection 40 mg    Orders Placed This Encounter  Procedures   KNEE INJECTION    Indications: Knee arthralgia (pain) due to osteoarthritis (OA) Imaging: Fluoroscopy (([id number] Position: Supine in semi-sitting position. Pillows under knee, bent 45-degree angle. Equipment/Materials: Block tray  1.5", 25-G (one per side)  Local anesthetic  Monovisc (one per side) Confirm availability (in office) of Monovisc ([facility] hyaluronan)    Scheduling Instructions:     Procedure: Knee injection Monovisc (Hyaluronan/Hyaluronic acid)  Treatment No.: 1     Level: Intra-articular     Laterality: Bilateral Knee     Sedation: Patient's choice.    Order Specific Question:   Where will this procedure be performed?    Answer:   [facility]   TRIGGER POINT INJECTION    Scheduling Instructions:     Area: Upper Back     Side: Right     Sedation: No Sedation.     Timeframe: Today    Order Specific Question:   Where will this procedure be performed?    Answer:   [facility]   DG PAIN CLINIC C-ARM 1-60 MIN NO REPORT    Intraoperative interpretation by procedural physician at [place].    Standing Status:   Standing    Number of Occurrences:   1    Order Specific Question:   Reason for exam:    Answer:   Assistance in needle guidance and placement for procedures requiring needle placement in or near specific anatomical locations not easily accessible without such assistance.   Informed Consent Details: Physician/Practitioner Attestation; Transcribe to consent form and obtain patient  signature    Nursing Order: Transcribe to consent form and obtain patient signature. Note: Always confirm laterality of pain with Ms. Brigitte Pulse, before procedure.    Order Specific Question:   Physician/Practitioner attestation of informed consent for procedure/surgical case    Answer:   I, the physician/practitioner, attest that I have discussed with the patient the benefits, risks, side effects, alternatives, likelihood of achieving goals and potential problems during recovery for the procedure that I have provided informed consent.    Order Specific Question:   Procedure    Answer:   Therapeutic intra-articular viscosupplementation knee injection    Order Specific Question:   Physician/Practitioner performing the procedure    Answer:   Tempestt Silba A. [name], MD    Order Specific Question:   Indication/Reason    Answer:   Chronic knee pain secondary to primary osteoarthritis of the knee  (Right-M17.11), (Left-M17.12) or (Bilateral-M17.0)   Provide equipment / supplies at bedside    "Block Tray" (Disposable  single use) Needle type: SpinalSpinal Amount/quantity: 2 Size: Regular (3.5-inch) Gauge: 22G    Standing Status:   Standing    Number of Occurrences:   1    Order Specific Question:   Specify    Answer:   Block Tray   Informed Consent Details: Physician/Practitioner Attestation; Transcribe to consent form and obtain patient signature    Provider Attestation: I, [place]. [name], MD, (Pain Management Specialist), the physician/practitioner, attest that I have discussed with the patient the benefits, risks, side effects, alternatives, likelihood of achieving goals and potential problems during recovery for the procedure that I have provided informed consent.    Scheduling Instructions:     Note: Always confirm laterality of pain with Ms. Brigitte Pulse, before procedure.     Transcribe to consent form and obtain patient signature.    Order Specific Question:   Physician/Practitioner attestation of  informed consent for procedure/surgical case    Answer:   I, the physician/practitioner, attest that I have discussed with the patient the benefits, risks, side effects, alternatives, likelihood of achieving goals and potential problems during recovery for the procedure that I have provided informed consent.    Order Specific Question:   Procedure    Answer:   Myoneural Block (Trigger Point injection)    Order Specific Question:   Physician/Practitioner performing the procedure  Answer:   Cherilynn Schomburg A. [name] MD    Order Specific Question:   Indication/Reason    Answer:   Musculoskeletal pain/myofascial pain secondary to trigger point   Provide equipment / supplies at bedside    "Block Tray" (Disposable  single use) Needle type: SpinalRegular Amount/quantity: 1 Size: Short(1.5-inch) Gauge: 25G    Standing Status:   Standing    Number of Occurrences:   1    Order Specific Question:   Specify    Answer:   Block Tray   Miscellanous precautions    NOTE: Although It is true that patients can have allergies to shellfish and that shellfish contain iodine, most shellfish  allergies are due to two protein allergens present in the shellfish: tropomyosins and parvalbumin. Not all patients with shellfish allergies are allergic to iodine. However, as a precaution, avoid using iodine containing products.    Standing Status:   Standing    Number of Occurrences:   1   Miscellanous precautions    Standing Status:   Standing    Number of Occurrences:   1     Time-out: 6144 I initiated and conducted the "Time-out" before starting the procedure, as per protocol. The patient was asked to participate by confirming the accuracy of the "Time Out" information. Verification of the correct person, site, and procedure were performed and confirmed by me, the nursing staff, and the patient. "Time-out" conducted as per Joint Commission's Universal Protocol (UP.01.01.01). Procedure checklist: Completed    Description of Procedure #1:  Area Prepped: Entire Posterior shoulder Region DuraPrep (Iodine Povacrylex [0.7% available iodine] and Isopropyl Alcohol, 74% w/w) Safety Precautions: Aspiration looking for blood return was conducted prior to all injections. At no point did we inject any substances, as a needle was being advanced. No attempts were made at seeking any paresthesias. Safe injection practices and needle disposal techniques used. Medications properly checked for expiration dates. SDV (single dose vial) medications used. Description of the Procedure: Protocol guidelines were followed. The patient was placed in position over the fluoroscopy table. The target area was identified and the area prepped in the usual manner. Skin & deeper tissues infiltrated with local anesthetic. Appropriate amount of time allowed to pass for local anesthetics to take effect. The procedure needles were then advanced to the target area. Proper needle placement secured. Negative aspiration confirmed. Solution injected in intermittent fashion, asking for systemic symptoms every 0.5cc of injectate. The needles were then removed and the area cleansed, making sure to leave some of the prepping solution back to take advantage of its long term bactericidal properties.  Start Time: 1145 hrs.  Description of procedure #2:   Start Time: 1207 hrs  Local Anesthesia: Once the patient was positioned, prepped, and time-out was completed. The target area was identified located. The skin was marked with an approved surgical skin marker. Once marked, the skin (epidermis, dermis, and hypodermis), and deeper tissues (fat, connective tissue and muscle) were infiltrated with a small amount of a short-acting local anesthetic, loaded on a 10cc syringe with a 25G, 1.5-in  Needle. An appropriate amount of time was allowed for local anesthetics to take effect before proceeding to the next step. Local Anesthetic: Lidocaine 1-2% The unused  portion of the local anesthetic was discarded in the proper designated containers. Safety Precautions: Aspiration looking for blood return was conducted prior to all injections. At no point did I inject any substances, as a needle was being advanced. Before injecting, the patient was told to immediately notify  me if she was experiencing any new onset of "ringing in the ears, or metallic taste in the mouth". No attempts were made at seeking any paresthesias. Safe injection practices and needle disposal techniques used. Medications properly checked for expiration dates. SDV (single dose vial) medications used. After the completion of the procedure, all disposable equipment used was discarded in the proper designated medical waste containers.  Technical description: Protocol guidelines were followed. After positioning, the target area was identified and prepped in the usual manner. Skin & deeper tissues infiltrated with local anesthetic. Appropriate amount of time allowed to pass for local anesthetics to take effect. Proper needle placement secured. Once satisfactory needle placement was confirmed, I proceeded to inject the desired solution in slow, incremental fashion, intermittently assessing for discomfort or any signs of abnormal or undesired spread of substance. Once completed, the needle was removed and disposed of, as per hospital protocols. The area was cleaned, making sure to leave some of the prepping solution back to take advantage of its long term bactericidal properties.  Aspiration:  Negative          Vitals:   05/25/22 1158 05/25/22 1203 05/25/22 1208 05/25/22 1213  BP: (!) 129/91 110/84 131/69 (!) 153/83  Pulse: 77 81 80 78  Resp: 17 16 (!) 22 (!) 21  Temp:      SpO2: 98% 97% 100% 100%  Weight:      Height:       End Time: 1207 hrs   Imaging Guidance for procedure #1:          Type of Imaging Technique: None used Indication(s): N/A Exposure Time: No patient exposure Contrast:  None used. Fluoroscopic Guidance: N/A Ultrasound Guidance: N/A Interpretation: N/A  Imaging guidance for procedure #2:  Imaging-assisted Technique: Fluoroscopy Guidance (Non-spinal) Indication(s): Morbid obesity. Assistance in needle guidance and placement for procedures requiring needle placement in or near specific anatomical locations impossible to access without such assistance. Exposure Time: Please see nurses notes. Contrast: None Fluoroscopic Guidance: I was personally present in the fluoroscopy suite, where the patient was placed in position for the procedure, over the fluoroscopy-compatible table. Fluoroscopy was manipulated, using "Tunnel Vision Technique", to obtain the best possible view of the target area, on the affected side. Parallax error was corrected before commencing the procedure. A "direction-depth-direction" technique was used to introduce the needle under continuous pulsed fluoroscopic guidance. Once the target was reached, antero-posterior, oblique, and lateral fluoroscopic projection views were taken to confirm needle placement in all planes. Electronic images uploaded into EMR. Ultrasound Guidance: N/A      Interpretation: Intraoperative imaging interpretation by performing Physician.  Severe right knee osteoarthritis (tricompartmental) with medial compartment "bone-on-bone".  Severe left knee osteoarthritis (tricompartmental).   Post-op assessment  Post-procedure Vital Signs:  Pulse/HCG Rate: 78  Temp: (!) 97.2 F (36.2 C) Resp: (!) 21 BP: (!) 153/83 SpO2: 100 %  EBL: None  Complications: No immediate post-treatment complications observed by team, or reported by patient.  Note: The patient tolerated the entire procedure well. A repeat set of vitals were taken after the procedure and the patient was kept under observation following institutional policy, for this type of procedure. Post-procedural neurological assessment was performed, showing return to  baseline, prior to discharge. The patient was provided with post-procedure discharge instructions, including a section on how to identify potential problems. Should any problems arise concerning this procedure, the patient was given instructions to immediately contact us, at any time, without hesitation. In any case, we plan to  contact the patient by telephone for a follow-up status report regarding this interventional procedure.  Comments:  No additional relevant information.   Plan of care  Chronic Opioid Analgesic:  Oxycodone IR 5 mg 1 tab PO 5X/day (#150/mo) (25 mg/day) MME/day: 37.5 mg/day.   Medications administered: We administered lidocaine, pentafluoroprop-tetrafluoroeth, lactated ringers, midazolam, Hyaluronan, Hyaluronan, ropivacaine (PF) 2 mg/mL (0.2%), and triamcinolone acetonide.  Follow-up plan:   Return in about 2 weeks (around 06/08/2022) for Proc-day (T,Th), (VV), (PPE).      Interventional Therapies  Risk  Complexity Considerations:   Estimated body mass index is 60.59 kg/m as calculated from the following:   Height as of 06/18/21: '5\' 4"'$  (1.626 m).   Weight as of 06/18/21: 353 lb (160.1 kg). NOTE: NO RFA until BMI less or equal to 35.  (Gastric bypass done on 06/03/2017) Iodine allergy  Contrast dye allergy  Shellfish allergy    Planned  Pending:      Under consideration:   Diagnostic bilateral genicular NB    Completed:   Therapeutic right L2-3 LESI x1 (04/29/2022) (100/100/60/60)  Therapeutic bilateral IA Monovisc knee inj. x1 (05/25/2022)  Therapeutic right trapezius TPI/MNB x1 (05/25/2022)  Therapeutic bilateral IA Zilretta knee inj. x4 (03/25/2022) (100/100/80/80)  Therapeutic bilateral IA Hyalgan knee inj. x16 (06/24/2020) (100/100/60/60)  Palliative/therapeutic bilateral lumbar facet MBB x8 (08/25/2021) (100/100/60/60)  Therapeutic right IA hip injection x3 (07/07/2017) (100/50/25/>50)  Therapeutic left IA shoulder (glenohumeral joint) injection x1  (05/31/2017) (50/0/100/100)    Completed by other providers:   Therapeutic bilateral THR (total hip replacements) (Dr. Rudene Christians) (Right: 11/27/2019) (Left: 07/29/2015)    Therapeutic  Palliative (PRN) options:   Palliative bilateral knee injections  Palliative bilateral lumbar facet MBBs     Recent Visits Date Type Provider Dept  05/24/22 Office Visit Milinda Pointer, MD [facility]  05/13/22 Office Visit Milinda Pointer, MD [facility]  04/29/22 Procedure visit Milinda Pointer, MD [facility]  04/13/22 Office Visit Milinda Pointer, MD [facility]  03/25/22 Procedure visit Milinda Pointer, MD [facility]  Showing recent visits within past 90 days and meeting all other requirements Today's Visits Date Type Provider Dept  05/25/22 Procedure visit Milinda Pointer, MD [facility]  Showing today's visits and meeting all other requirements Future Appointments Date Type Provider Dept  06/08/22 Appointment Milinda Pointer, MD [facility]  08/18/22 Appointment Milinda Pointer, MD [facility]  Showing future appointments within next 90 days and meeting all other requirements   Disposition: Discharge home  Discharge (Date  Time): 05/25/2022; 1216 hrs.   Primary Care Physician: Idelle Crouch, MD Location: [facility] Note by: Gaspar Cola, MD Date: 05/25/2022; Time: 1:02 PM  DISCLAIMER: Medicine is not an Chief Strategy Officer. It has no guarantees or warranties. The decision to proceed with this intervention was based on the information collected from the patient. Conclusions were drawn from the patient's questionnaire, interview, and examination. Because information was provided in large part by the patient, it cannot be guaranteed that it has not been purposely or unconsciously manipulated or altered. Every effort has been made to obtain as much accurate,  relevant, available data as possible. Always take into account that the treatment will also be dependent on availability of resources and existing treatment guidelines, considered by other Pain Management Specialists as being common knowledge and practice, at the time of the intervention. It is also important to point out that variation in procedural techniques and pharmacological choices  are the acceptable norm. For Medico-Legal review purposes, the indications, contraindications, technique, and results of the these procedures should only be evaluated, judged and interpreted by a Board-Certified Interventional Pain Specialist with extensive familiarity and expertise in the same exact procedure and technique.

## 2022-05-26 ENCOUNTER — Inpatient Hospital Stay: Payer: Medicare HMO | Attending: Oncology

## 2022-05-26 ENCOUNTER — Inpatient Hospital Stay: Payer: Medicare HMO

## 2022-05-26 ENCOUNTER — Telehealth: Payer: Self-pay

## 2022-05-26 ENCOUNTER — Encounter: Payer: Self-pay | Admitting: Oncology

## 2022-05-26 ENCOUNTER — Inpatient Hospital Stay (HOSPITAL_BASED_OUTPATIENT_CLINIC_OR_DEPARTMENT_OTHER): Payer: Medicare HMO | Admitting: Oncology

## 2022-05-26 VITALS — BP 110/66 | HR 81 | Resp 18

## 2022-05-26 VITALS — BP 115/68 | HR 77 | Temp 96.6°F | Resp 20 | Wt 375.6 lb

## 2022-05-26 DIAGNOSIS — D508 Other iron deficiency anemias: Secondary | ICD-10-CM

## 2022-05-26 DIAGNOSIS — D509 Iron deficiency anemia, unspecified: Secondary | ICD-10-CM

## 2022-05-26 DIAGNOSIS — Z9884 Bariatric surgery status: Secondary | ICD-10-CM | POA: Diagnosis not present

## 2022-05-26 LAB — CBC WITH DIFFERENTIAL/PLATELET
Abs Immature Granulocytes: 0.03 10*3/uL (ref 0.00–0.07)
Basophils Absolute: 0 10*3/uL (ref 0.0–0.1)
Basophils Relative: 0 %
Eosinophils Absolute: 0.1 10*3/uL (ref 0.0–0.5)
Eosinophils Relative: 1 %
HCT: 38.6 % (ref 36.0–46.0)
Hemoglobin: 12.5 g/dL (ref 12.0–15.0)
Immature Granulocytes: 0 %
Lymphocytes Relative: 19 %
Lymphs Abs: 1.9 10*3/uL (ref 0.7–4.0)
MCH: 28.3 pg (ref 26.0–34.0)
MCHC: 32.4 g/dL (ref 30.0–36.0)
MCV: 87.5 fL (ref 80.0–100.0)
Monocytes Absolute: 0.5 10*3/uL (ref 0.1–1.0)
Monocytes Relative: 5 %
Neutro Abs: 7.4 10*3/uL (ref 1.7–7.7)
Neutrophils Relative %: 75 %
Platelets: 258 10*3/uL (ref 150–400)
RBC: 4.41 MIL/uL (ref 3.87–5.11)
RDW: 13.2 % (ref 11.5–15.5)
WBC: 9.9 10*3/uL (ref 4.0–10.5)
nRBC: 0 % (ref 0.0–0.2)

## 2022-05-26 LAB — IRON AND TIBC
Iron: 59 ug/dL (ref 28–170)
Saturation Ratios: 13 % (ref 10.4–31.8)
TIBC: 444 ug/dL (ref 250–450)
UIBC: 385 ug/dL

## 2022-05-26 LAB — FERRITIN: Ferritin: 24 ng/mL (ref 11–307)

## 2022-05-26 MED ORDER — SODIUM CHLORIDE 0.9 % IV SOLN
INTRAVENOUS | Status: DC
  Filled 2022-05-26: qty 250

## 2022-05-26 MED ORDER — SODIUM CHLORIDE 0.9 % IV SOLN
200.0000 mg | Freq: Once | INTRAVENOUS | Status: AC
  Administered 2022-05-26: 200 mg via INTRAVENOUS
  Filled 2022-05-26: qty 200

## 2022-05-26 NOTE — Progress Notes (Signed)
Hematology/Oncology Progress note [phone] [phone]      Patient Care Team: Idelle Crouch, MD as PCP - General (Internal Medicine)  REFERRING PROVIDER: Idelle Crouch, MD CHIEF COMPLAINTS/REASON FOR VISIT:  Follow up  iron deficiency anemia  HISTORY OF PRESENTING ILLNESS:  Karen Lewis is a  51 y.o.  female with PMH listed below was seen in consultation at the request of Idelle Crouch, MD   for evaluation of iron deficiency anemia.   Reviewed patient's recent labs  Labs revealed anemia with hemoglobin of .   Reviewed patient's previous labs  05/16/2019 CBC showed hemoglobin 9.4, WBC 11.4, MCV 68.7, platelet count 352,000. Iron panel showed ferritin of 8, iron 34, folate 19.3, No aggravating or improving factors.  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  She follows up with ID Dr. Tama High.    Associated signs and symptoms: Patient reports fatigue.  Associated with SOB with exertion.  Denies weight loss, easy bruising, hematochezia, hemoptysis, hematuria. Context:  History of iron deficiency: Denies Rectal bleeding: Denies denies Menstrual bleeding/ Vaginal bleeding : Denies Hematemesis or hemoptysis : denies Blood in urine : denies  Last endoscopy: History of gastric sleeve.  And then history of gastric bypass on 09/21/ 2018 Fatigue: Yes.  SOB: Yes  +Chronic lower extremity swelling/lymphedema. Moved from [place] 10 years ago.  Previously worked as a Marine scientist.  INTERVAL HISTORY Karen Lewis is a 51 y.o. female who has above history reviewed by me today presents for follow up visit for management of iron deficiency anemia Patient receives maintenance Venofer every 6 months. Patient reports feeling tired.  No blood in the stool, abdominal pain. Recently had knee injection  Review of Systems  Constitutional:  Positive for fatigue.  Negative for appetite change, chills and fever.  HENT:   Negative for hearing loss and voice change.   Eyes:  Negative for eye problems.  Respiratory:  Negative for chest tightness, cough and shortness of breath.   Cardiovascular:  Positive for leg swelling. Negative for chest pain.  Gastrointestinal:  Negative for abdominal distention, abdominal pain and blood in stool.  Endocrine: Negative for hot flashes.  Genitourinary:  Negative for difficulty urinating and frequency.   Musculoskeletal:  Positive for arthralgias and back pain.  Skin:  Negative for itching and rash.  Neurological:  Negative for extremity weakness.  Hematological:  Negative for adenopathy.  Psychiatric/Behavioral:  Negative for confusion.     MEDICAL HISTORY:  Past Medical History:  Diagnosis Date   Anemia    Anginal pain (HCC)    Anxiety    Arthralgia of hip 07/29/2015   Arthritis    Arthritis, degenerative 07/29/2015   Asthma    Cephalalgia 07/25/2014   Dependence on unknown drug (Why)    multiplt controlled drug dependence   Depression    Diabetes mellitus without complication ([place])    Difficult intubation    per pt needs small tube (has had abrasions from tube in past)   Dysrhythmia    PVC's   Eczema    Fibromyalgia    Gastritis    GERD (gastroesophageal reflux disease)    Gonalgia 07/29/2015   Overview:  Overview:  The patient has had bilateral intra-articular Hyalgan injections done on 07/16/2014 and although she seems to do well with this type of therapy, apparently her insurance company does not want to pay for they Hyalgan. On 11/27/2014  the patient underwent a bilateral genicular nerve block with excellent results. On 01/28/2015 she had a right knee genicular radiofrequency ablatio   Gout    H/O cardiovascular disorder 03/10/2015   H/O surgical procedure 12/05/2012   Overview:  LSG ([facility] - April 2013)     H/O thyroid disease 03/10/2015   Headache    Herpes    History of artificial joint  07/29/2015   History of hiatal hernia    Hypertension    Hypomagnesemia    Hypothyroidism    IDA (iron deficiency anemia) 05/28/2019   LBP (low back pain) 07/29/2015   Neuromuscular disorder (HCC)    Obesity    PCOS (polycystic ovarian syndrome)    Primary osteoarthritis of both knees 07/29/2015   Sleep apnea    not using a Cpap machine at this time - most recent test mild apnea does not qualify for test (not OSA)   Thyroid nodule bilateral   Umbilical hernia     SURGICAL HISTORY: Past Surgical History:  Procedure Laterality Date   APPENDECTOMY     CARPAL TUNNEL RELEASE Bilateral    CHOLECYSTECTOMY     COLONOSCOPY WITH PROPOFOL N/A 02/11/2021   Procedure: COLONOSCOPY WITH PROPOFOL;  Surgeon: Toledo, Benay Pike, MD;  Location: [facility];  Service: Gastroenterology;  Laterality: N/A;   DIAGNOSTIC LAPAROSCOPY     ESOPHAGOGASTRODUODENOSCOPY (EGD) WITH PROPOFOL N/A 02/11/2021   Procedure: ESOPHAGOGASTRODUODENOSCOPY (EGD) WITH PROPOFOL;  Surgeon: Toledo, Benay Pike, MD;  Location: [facility];  Service: Gastroenterology;  Laterality: N/A;   HIATAL HERNIA REPAIR     JOINT REPLACEMENT Bilateral hip   LAPAROSCOPIC PARTIAL GASTRECTOMY     left trigger finger     peniculectomy N/A 07/05/2018   ROUX-EN-Y GASTRIC BYPASS  06/03/2017   SHOULDER ARTHROSCOPY Right    THYROIDECTOMY N/A 11/12/2015   Procedure: THYROIDECTOMY;  Surgeon: Clyde Canterbury, MD;  Location: [facility];  Service: ENT;  Laterality: N/A;   TOTAL HIP ARTHROPLASTY Right 11/27/2019   Procedure: TOTAL HIP ARTHROPLASTY ANTERIOR APPROACH;  Surgeon: Hessie Knows, MD;  Location: [facility];  Service: Orthopedics;  Laterality: Right;   TRIGGER FINGER RELEASE Right    TRIGGER FINGER RELEASE Right 04/24/2020   Procedure: Right ring and middle trigger release;  Surgeon: Hessie Knows, MD;  Location: [facility];  Service: Orthopedics;  Laterality: Right;    SOCIAL HISTORY: Social History   Socioeconomic History   Marital status: Married     Spouse name: Not on file   Number of children: 0   Years of education: 12   Highest education level: High school graduate  Occupational History   Occupation: Disabled  Tobacco Use   Smoking status: Never   Smokeless tobacco: Never  Vaping Use   Vaping Use: Never used  Substance and Sexual Activity   Alcohol use: No    Alcohol/week: 0.0 standard drinks of alcohol   Drug use: No   Sexual activity: Not Currently    Birth control/protection: Injection  Other Topics Concern   Not on file  Social History Narrative   Not on file   Social Determinants of Health   Financial Resource Strain: Low Risk  (12/14/2019)   Overall Financial Resource Strain (CARDIA)    Difficulty of Paying Living Expenses: Not hard at all  Food Insecurity: No Food Insecurity (06/18/2021)   Hunger Vital Sign    Worried About Running Out of Food in the Last Year: Never true    Ran Out of Food in the Last Year: Never true  Transportation Needs: No Transportation Needs (12/14/2019)   PRAPARE - Hydrologist (Medical): No    Lack of Transportation (Non-Medical): No  Physical Activity: Inactive (12/14/2019)   Exercise Vital Sign    Days of Exercise per Week: 0 days    Minutes of Exercise per Session: 0 min  Stress: No Stress Concern Present (01/20/2022)   [place]    Feeling of Stress : Only a little  Social Connections: Moderately Integrated (01/20/2022)   Social Connection and Isolation Panel [NHANES]    Frequency of Communication with Friends and Family: More than three times a week    Frequency of Social Gatherings with Friends and Family: More than three times a week    Attends Religious Services: More than 4 times per year    Active Member of [employer] or Organizations: No    Attends Archivist Meetings: Never    Marital Status: Married  Human resources officer Violence: Not At Risk (12/14/2019)   Humiliation,  Afraid, Rape, and Kick questionnaire    Fear of Current or Ex-Partner: No    Emotionally Abused: No    Physically Abused: No    Sexually Abused: No    FAMILY HISTORY: Family History  Problem Relation Age of Onset   Anxiety disorder Mother    Depression Mother    Alcohol abuse Mother    Diabetes Mother    Hypertension Mother    Kidney cancer Mother    Sleep apnea Mother    Alcohol abuse Father    Anxiety disorder Father    Depression Father    Post-traumatic stress disorder Father    Kidney failure Father    COPD Father    Diabetes Father    Hypertension Father    Sleep apnea Father    Depression Brother    Diabetes Brother    Hypertension Brother    Sleep apnea Brother    Breast cancer Paternal Aunt 8   Breast cancer Paternal Aunt 38   Brain cancer Paternal Uncle 35   Bladder Cancer Neg Hx    Prostate cancer Neg Hx     ALLERGIES:  is allergic to bactrim [sulfamethoxazole-trimethoprim], omalizumab, ciprofloxacin, shellfish allergy, and nsaids.  MEDICATIONS:  Current Outpatient Medications  Medication Sig Dispense Refill   ACCU-CHEK AVIVA PLUS test strip      acetaminophen (TYLENOL) 650 MG CR tablet Take 650-1,300 mg by mouth every 8 (eight) hours as needed for pain.     acidophilus (RISAQUAD) CAPS capsule Take 1 capsule by mouth daily.     albuterol (PROVENTIL HFA;VENTOLIN HFA) 108 (90 BASE) MCG/ACT inhaler Inhale 2 puffs into the lungs every 6 (six) hours as needed for wheezing or shortness of breath.     amitriptyline (ELAVIL) 100 MG tablet Take 75 mg by mouth at bedtime.     Ascorbic Acid (VITAMIN C) 1000 MG tablet Take 1,000 mg by mouth daily.      Biotin 10 MG CAPS Take 10 mg by mouth daily.      butalbital-acetaminophen-caffeine (FIORICET) 50-325-40 MG tablet Take 1 tablet by mouth every 6 (six) hours as needed for migraine.      calcium carbonate (TUMS EX) 750 MG chewable tablet Chew 1 tablet by mouth daily.      clonazePAM (KLONOPIN) 0.5 MG tablet Take 1  tablet (0.5 mg total) by mouth 3 (three) times daily as needed for anxiety. 30 tablet 0   Cyanocobalamin (B-12) 2500  MCG TABS Take 2,500 mcg by mouth daily.     diclofenac sodium (VOLTAREN) 1 % GEL Apply 2 g topically 4 (four) times daily. (Patient taking differently: Apply 2 g topically 3 (three) times daily as needed (pain).) 100 g 1   diphenhydrAMINE (BENADRYL) 25 MG tablet Take 25 mg by mouth every 8 (eight) hours as needed for itching or allergies.      DUPIXENT 300 MG/2ML SOPN Inject 300 mg into the skin every 14 (fourteen) days.     EPINEPHrine 0.3 mg/0.3 mL IJ SOAJ injection Inject 0.3 mg into the muscle as needed for anaphylaxis.     ergocalciferol (VITAMIN D2) 1.25 MG (50000 UT) capsule Take 50,000 Units by mouth 2 (two) times a week.      famotidine (PEPCID) 20 MG tablet Take 20 mg by mouth at bedtime.     fluticasone furoate-vilanterol (BREO ELLIPTA) 200-25 MCG/INH AEPB Inhale 1 puff into the lungs daily.     furosemide (LASIX) 20 MG tablet Take 20 mg by mouth daily as needed.     Galcanezumab-gnlm (EMGALITY) 120 MG/ML SOAJ Inject 120 mg into the skin every 30 (thirty) days.     glucose blood (ACCU-CHEK AVIVA PLUS) test strip Use 2 (two) times daily     levocetirizine (XYZAL) 5 MG tablet Take 5 mg by mouth at bedtime.      levothyroxine (SYNTHROID) 125 MCG tablet Take 200 mcg by mouth daily before breakfast.     lisinopril (ZESTRIL) 5 MG tablet Take 5 mg by mouth at bedtime.      magnesium oxide (MAG-OX) 400 MG tablet Take 1,200 mg by mouth 2 (two) times daily. Take 1200 mg by mouth in the morning and 1200 mg at bedtime     meclizine (ANTIVERT) 25 MG tablet Take 25 mg by mouth 3 (three) times daily as needed for dizziness.      metFORMIN (GLUCOPHAGE) 1000 MG tablet Take 1,000 mg by mouth 2 (two) times daily with a meal.      metoprolol tartrate (LOPRESSOR) 50 MG tablet Take 100 mg by mouth 2 (two) times daily.     montelukast (SINGULAIR) 10 MG tablet Take 10 mg by mouth at bedtime.       Multiple Vitamin (TAB-A-VITE) TABS      naphazoline-pheniramine (NAPHCON-A) 0.025-0.3 % ophthalmic solution Place 1 drop into both eyes 4 (four) times daily as needed for eye irritation.     naproxen sodium (ALEVE) 220 MG tablet Take 220 mg by mouth as needed.     nitrofurantoin, macrocrystal-monohydrate, (MACROBID) 100 MG capsule Take 1 capsule (100 mg total) by mouth daily. 30 capsule 11   omeprazole (PRILOSEC) 40 MG capsule Take 40 mg by mouth in the morning and at bedtime.     [START ON 05/28/2022] oxyCODONE (OXY IR/ROXICODONE) 5 MG immediate release tablet Take 1 tablet (5 mg total) by mouth every 8 (eight) hours as needed for severe pain. Must last 30 days 90 tablet 0   [START ON 06/27/2022] oxyCODONE (OXY IR/ROXICODONE) 5 MG immediate release tablet Take 1 tablet (5 mg total) by mouth every 8 (eight) hours as needed for severe pain. Must last 30 days 90 tablet 0   [START ON 07/27/2022] oxyCODONE (OXY IR/ROXICODONE) 5 MG immediate release tablet Take 1 tablet (5 mg total) by mouth every 8 (eight) hours as needed for severe pain. Must last 30 days 90 tablet 0   OZEMPIC, 0.25 OR 0.5 MG/DOSE, 2 MG/1.5ML SOPN Inject 1 mg into the skin once  a week.     pregabalin (LYRICA) 225 MG capsule Take 1 capsule (225 mg total) by mouth 2 (two) times daily. 180 capsule 0   QUEtiapine (SEROQUEL) 300 MG tablet Take 300 mg by mouth at bedtime.     rizatriptan (MAXALT) 10 MG tablet Take 1 and may repeat in 2 hours for MIgraines, alternate with the disintrgrating tablet.     rizatriptan (MAXALT-MLT) 10 MG disintegrating tablet Take 10 mg by mouth. Take 1 and may repeat in 2 hours  for Migraines     rosuvastatin (CRESTOR) 40 MG tablet Take 40 mg by mouth daily.      sertraline (ZOLOFT) 100 MG tablet Take 200 mg by mouth daily.      spironolactone (ALDACTONE) 25 MG tablet Take 37.5 mg by mouth 2 (two) times daily.      tiZANidine (ZANAFLEX) 4 MG tablet Take 1 tablet (4 mg total) by mouth 2 (two) times daily as  needed for muscle spasms. 30 tablet 0   topiramate (TOPAMAX) 50 MG tablet Take 100 mg by mouth at bedtime.     zolpidem (AMBIEN) 10 MG tablet Take 10 mg by mouth at bedtime as needed for sleep.     No current facility-administered medications for this visit.   Facility-Administered Medications Ordered in Other Visits  Medication Dose Route Frequency Provider Last Rate Last Admin   0.9 %  sodium chloride infusion   Intravenous Continuous Earlie Server, MD   Stopped at 05/26/22 1456     PHYSICAL EXAMINATION: ECOG PERFORMANCE STATUS: 1 - Symptomatic but completely ambulatory Vitals:   05/26/22 1311  BP: 115/68  Pulse: 77  Resp: 20  Temp: (!) 96.6 F (35.9 C)  SpO2: 97%   Filed Weights   05/26/22 1311  Weight: (!) 375 lb 9.6 oz (170.4 kg)    Physical Exam Constitutional:      General: She is not in acute distress.    Appearance: She is obese.  HENT:     Head: Normocephalic and atraumatic.  Eyes:     General: No scleral icterus.    Pupils: Pupils are equal, round, and reactive to light.  Cardiovascular:     Rate and Rhythm: Normal rate and regular rhythm.     Heart sounds: Normal heart sounds.  Pulmonary:     Effort: Pulmonary effort is normal. No respiratory distress.     Breath sounds: No wheezing.  Abdominal:     General: Bowel sounds are normal.     Palpations: Abdomen is soft.  Musculoskeletal:        General: No deformity. Normal range of motion.     Cervical back: Normal range of motion and neck supple.  Skin:    General: Skin is warm and dry.  Neurological:     Mental Status: She is alert and oriented to person, place, and time.     Cranial Nerves: No cranial nerve deficit.     Coordination: Coordination normal.  Psychiatric:        Mood and Affect: Mood normal.     LABORATORY DATA:  I have reviewed the data as listed    Latest Ref Rng & Units 05/26/2022   12:50 PM 10/26/2021    1:53 PM 04/28/2021    7:39 PM  CBC  WBC 4.0 - 10.5 K/uL 9.9  9.9  13.9    Hemoglobin 12.0 - 15.0 g/dL 12.5  13.1  14.0   Hematocrit 36.0 - 46.0 % 38.6  40.5  43.0  Platelets 150 - 400 K/uL 258  315  346       Latest Ref Rng & Units 04/28/2021    7:39 PM 04/22/2021   11:39 AM 10/17/2020    1:08 PM  CMP  Glucose 70 - 99 mg/dL 108  144  201   BUN 6 - 20 mg/dL 19  32  17   Creatinine 0.44 - 1.00 mg/dL 1.06  1.20  0.95   Sodium 135 - 145 mmol/L 134  132  137   Potassium 3.5 - 5.1 mmol/L 4.5  3.8  4.5   Chloride 98 - 111 mmol/L 101  99  104   CO2 22 - 32 mmol/L '22  22  22   '$ Calcium 8.9 - 10.3 mg/dL 8.4  8.9  8.3   Total Protein 6.5 - 8.1 g/dL  7.4  7.1   Total Bilirubin 0.3 - 1.2 mg/dL  0.6  0.7   Alkaline Phos 38 - 126 U/L  63  60   AST 15 - 41 U/L  34  40   ALT 0 - 44 U/L  34  43    Iron/TIBC/Ferritin/ %Sat    Component Value Date/Time   IRON 59 05/26/2022 1250   TIBC 444 05/26/2022 1250   FERRITIN 24 05/26/2022 1250   IRONPCTSAT 13 05/26/2022 1250     DG PAIN CLINIC C-ARM 1-60 MIN NO REPORT  Result Date: 05/25/2022 Fluoro was used, but no Radiologist interpretation will be provided. Please refer to "NOTES" tab for provider progress note.  DG PAIN CLINIC C-ARM 1-60 MIN NO REPORT  Result Date: 04/29/2022 Fluoro was used, but no Radiologist interpretation will be provided. Please refer to "NOTES" tab for provider progress note.     ASSESSMENT & PLAN:  1. Other iron deficiency anemia   2. History of gastric bypass    #Iron deficiency anemia  Labs reviewed and discussed with patient CBC shows stable hemoglobin 12.5, iron saturation 13, ferritin 24. Proceed with maintenance Venofer 200 mg x 1 today.  History of gastric bypass continue vitamin B12 supplementation.  Follow-up in 6 months.  Labs will be +/- treatment.  CBC, iron TIBC ferritin, B12.  All questions were answered. The patient knows to call the clinic with any problems questions or concerns.  Cc Sparks, Leonie Douglas, MD    Earlie Server, MD, PhD Hematology Oncology  05/26/2022

## 2022-05-26 NOTE — Telephone Encounter (Signed)
Post procedure phone call.  LM 

## 2022-05-28 ENCOUNTER — Ambulatory Visit: Payer: Medicare HMO

## 2022-06-02 DIAGNOSIS — F331 Major depressive disorder, recurrent, moderate: Secondary | ICD-10-CM | POA: Diagnosis not present

## 2022-06-03 DIAGNOSIS — M170 Bilateral primary osteoarthritis of knee: Secondary | ICD-10-CM | POA: Diagnosis not present

## 2022-06-07 NOTE — Progress Notes (Signed)
1310 Called for pre-visit assessment. No answer and mailbox is full.

## 2022-06-08 ENCOUNTER — Ambulatory Visit: Payer: Medicare HMO | Attending: Pain Medicine | Admitting: Pain Medicine

## 2022-06-08 DIAGNOSIS — Z91199 Patient's noncompliance with other medical treatment and regimen due to unspecified reason: Secondary | ICD-10-CM

## 2022-06-08 NOTE — Progress Notes (Signed)
Unsuccessful attempt to contact patient for Virtual Visit (Pain Management Telehealth)   Patient provided contact information:  [phone] (home); [phone] (mobile); (Preferred) [phone] [email or web address]   Pre-screening:  Our staff was unsuccessful in contacting Karen Lewis using the above provided information.   I unsuccessfully attempted to make contact with Karen Lewis on 06/08/2022 via telephone. I was unable to complete the virtual encounter due to call going directly to voicemail. I was unable to leave a message due to the mailbox being full and not accepting any new messages.  Pharmacotherapy Assessment  Analgesic: Oxycodone IR 5 mg 1 tab PO 5X/day (#150/mo) (25 mg/day) MME/day: 37.5 mg/day.   Follow-up plan:   Reschedule Visit.     Interventional Therapies  Risk  Complexity Considerations:   Estimated body mass index is 60.59 kg/m as calculated from the following:   Height as of 06/18/21: '5\' 4"'$  (1.626 m).   Weight as of 06/18/21: 353 lb (160.1 kg). NOTE: NO RFA until BMI less or equal to 35.  (Gastric bypass done on 06/03/2017) Iodine allergy  Contrast dye allergy  Shellfish allergy    Planned  Pending:      Under consideration:   Diagnostic bilateral genicular NB    Completed:   Therapeutic right L2-3 LESI x1 (04/29/2022) (100/100/60/60)  Therapeutic bilateral IA Monovisc knee inj. x1 (05/25/2022)  Therapeutic right trapezius TPI/MNB x1 (05/25/2022)  Therapeutic bilateral IA Zilretta knee inj. x4 (03/25/2022) (100/100/80/80)  Therapeutic bilateral IA Hyalgan knee inj. x16 (06/24/2020) (100/100/60/60)  Palliative/therapeutic bilateral lumbar facet MBB x8 (08/25/2021) (100/100/60/60)  Therapeutic right IA hip injection x3 (07/07/2017) (100/50/25/>50)  Therapeutic left IA shoulder (glenohumeral joint) injection x1 (05/31/2017) (50/0/100/100)    Completed by other providers:   Therapeutic bilateral THR (total hip replacements) (Dr. Rudene Christians) (Right: 11/27/2019) (Left:  07/29/2015)    Therapeutic  Palliative (PRN) options:   Palliative bilateral knee injections  Palliative bilateral lumbar facet MBBs      Recent Visits Date Type Provider Dept  05/25/22 Procedure visit Milinda Pointer, MD [facility]  05/24/22 Office Visit Milinda Pointer, MD [facility]  05/13/22 Office Visit Milinda Pointer, MD [facility]  04/29/22 Procedure visit Milinda Pointer, MD [facility]  04/13/22 Office Visit Milinda Pointer, MD [facility]  03/25/22 Procedure visit Milinda Pointer, MD [facility]  Showing recent visits within past 90 days and meeting all other requirements Today's Visits Date Type Provider Dept  06/08/22 Office Visit Milinda Pointer, MD [facility]  Showing today's visits and meeting all other requirements Future Appointments Date Type Provider Dept  08/18/22 Appointment Milinda Pointer, MD [facility]  Showing future appointments within next 90 days and meeting all other requirements   Note by: Gaspar Cola, MD Date: 06/08/2022; Time: 3:00 PM

## 2022-06-09 NOTE — Progress Notes (Signed)
Referring Physician:  Idelle Crouch, MD [facility] [place],  [place] [address]  Primary Physician:  Idelle Crouch, MD  History of Present Illness: 06/10/2022 [name] sees pain management Karen Lewis) and is on oxyIR. History of chronic back pain, FM, chronic pain syndrome.   She had trigger point injection in right trapezius and bilateral knee injections with bilateral knee viscosupplementation injections on 05/25/22.   She has constant mid back to lower back pain with some numbness/pain in her right lateral thigh. She has numbness in left foot- has known diabetic neuropathy. She uses a scooter- has difficulty walking due to knee pain/left ankle pain. She can only walk short distances. Pain is constant- no alleviating factors. She notes weakness in right leg.   History of gastric bypass x 2 and bilateral THAs. May need bilateral TKAs at some point.   She had similar relief after facet injections that she had after lumbar ESI.   Current BMI is 65, for  BMI of 35 she would need to get down to 197.   Conservative measures:  Physical therapy: thinks it was a year or 2 ago with some relief.  Multimodal medical therapy including regular antiinflammatories: oxyIR, zanaflex, lyrica, aleve, voltaren gel, tylenol.  Injections:  Therapeutic right L2-3 LESI x1 (04/29/2022)  Palliative/therapeutic bilateral lumbar facet MBB x8 (08/25/2021)   Past Surgery: no spinal surgery  Karen Lewis has no symptoms of cervical myelopathy. She has some dizziness due to her migraines.   The symptoms are causing a significant impact on the patient's life.   Review of Systems:  A 10 point review of systems is negative, except for the pertinent positives and negatives detailed in the HPI.  Past Medical History: Past Medical History:  Diagnosis Date   Anemia    Anginal pain ([place])    Anxiety    Arthralgia of hip 07/29/2015   Arthritis    Arthritis, degenerative  07/29/2015   Asthma    Cephalalgia 07/25/2014   Dependence on unknown drug ([place])    multiplt controlled drug dependence   Depression    Diabetes mellitus without complication ([place])    Difficult intubation    per pt needs small tube (has had abrasions from tube in past)   Dysrhythmia    PVC's   Eczema    Fibromyalgia    Gastritis    GERD (gastroesophageal reflux disease)    Gonalgia 07/29/2015   Overview:  Overview:  The patient has had bilateral intra-articular Hyalgan injections done on 07/16/2014 and although she seems to do well with this type of therapy, apparently her insurance company does not want to pay for they Hyalgan. On 11/27/2014 the patient underwent a bilateral genicular nerve block with excellent results. On 01/28/2015 she had a right knee genicular radiofrequency ablatio   Gout    H/O cardiovascular disorder 03/10/2015   H/O surgical procedure 12/05/2012   Overview:  LSG ([facility] - April 2013)     H/O thyroid disease 03/10/2015   Headache    Herpes    History of artificial joint 07/29/2015   History of hiatal hernia    Hypertension    Hypomagnesemia    Hypothyroidism    IDA (iron deficiency anemia) 05/28/2019   LBP (low back pain) 07/29/2015   Neuromuscular disorder ([place])    Obesity    PCOS (polycystic ovarian syndrome)    Primary osteoarthritis of both knees 07/29/2015   Sleep apnea    not using  a Cpap machine at this time - most recent test mild apnea does not qualify for test (not OSA)   Thyroid nodule bilateral   Umbilical hernia     Past Surgical History: Past Surgical History:  Procedure Laterality Date   APPENDECTOMY     CARPAL TUNNEL RELEASE Bilateral    CHOLECYSTECTOMY     COLONOSCOPY WITH PROPOFOL N/A 02/11/2021   Procedure: COLONOSCOPY WITH PROPOFOL;  Surgeon: Toledo, Benay Pike, MD;  Location: [facility];  Service: Gastroenterology;  Laterality: N/A;   DIAGNOSTIC LAPAROSCOPY     ESOPHAGOGASTRODUODENOSCOPY (EGD) WITH PROPOFOL N/A 02/11/2021    Procedure: ESOPHAGOGASTRODUODENOSCOPY (EGD) WITH PROPOFOL;  Surgeon: Toledo, Benay Pike, MD;  Location: [facility];  Service: Gastroenterology;  Laterality: N/A;   HIATAL HERNIA REPAIR     JOINT REPLACEMENT Bilateral hip   LAPAROSCOPIC PARTIAL GASTRECTOMY     left trigger finger     peniculectomy N/A 07/05/2018   ROUX-EN-Y GASTRIC BYPASS  06/03/2017   SHOULDER ARTHROSCOPY Right    THYROIDECTOMY N/A 11/12/2015   Procedure: THYROIDECTOMY;  Surgeon: Clyde Canterbury, MD;  Location: [facility];  Service: ENT;  Laterality: N/A;   TOTAL HIP ARTHROPLASTY Right 11/27/2019   Procedure: TOTAL HIP ARTHROPLASTY ANTERIOR APPROACH;  Surgeon: Hessie Knows, MD;  Location: [facility];  Service: Orthopedics;  Laterality: Right;   TRIGGER FINGER RELEASE Right    TRIGGER FINGER RELEASE Right 04/24/2020   Procedure: Right ring and middle trigger release;  Surgeon: Hessie Knows, MD;  Location: [facility];  Service: Orthopedics;  Laterality: Right;    Allergies: Allergies as of 06/11/2022 - Review Complete 05/26/2022  Allergen Reaction Noted   Bactrim [sulfamethoxazole-trimethoprim] Hives 01/28/2014   Omalizumab Itching and Hives 10/18/2013   Ciprofloxacin Other (See Comments) 04/14/2015   Shellfish allergy Other (See Comments) 06/29/2017   Nsaids Nausea Only and Other (See Comments) 05/07/2015    Medications: Outpatient Encounter Medications as of 06/11/2022  Medication Sig   ACCU-CHEK AVIVA PLUS test strip    acetaminophen (TYLENOL) 650 MG CR tablet Take 650-1,300 mg by mouth every 8 (eight) hours as needed for pain.   acidophilus (RISAQUAD) CAPS capsule Take 1 capsule by mouth daily.   albuterol (PROVENTIL HFA;VENTOLIN HFA) 108 (90 BASE) MCG/ACT inhaler Inhale 2 puffs into the lungs every 6 (six) hours as needed for wheezing or shortness of breath.   amitriptyline (ELAVIL) 100 MG tablet Take 75 mg by mouth at bedtime.   Ascorbic Acid (VITAMIN C) 1000 MG tablet Take 1,000 mg by mouth daily.    Biotin 10 MG CAPS  Take 10 mg by mouth daily.    butalbital-acetaminophen-caffeine (FIORICET) 50-325-40 MG tablet Take 1 tablet by mouth every 6 (six) hours as needed for migraine.    calcium carbonate (TUMS EX) 750 MG chewable tablet Chew 1 tablet by mouth daily.    Cyanocobalamin (B-12) 2500 MCG TABS Take 2,500 mcg by mouth daily.   diclofenac sodium (VOLTAREN) 1 % GEL Apply 2 g topically 4 (four) times daily. (Patient taking differently: Apply 2 g topically 3 (three) times daily as needed (pain).)   diphenhydrAMINE (BENADRYL) 25 MG tablet Take 25 mg by mouth every 8 (eight) hours as needed for itching or allergies.    EPINEPHrine 0.3 mg/0.3 mL IJ SOAJ injection Inject 0.3 mg into the muscle as needed for anaphylaxis.   ergocalciferol (VITAMIN D2) 1.25 MG (50000 UT) capsule Take 50,000 Units by mouth 2 (two) times a week.    famotidine (PEPCID) 20 MG tablet Take 20 mg by mouth  at bedtime.   fluticasone furoate-vilanterol (BREO ELLIPTA) 200-25 MCG/INH AEPB Inhale 1 puff into the lungs daily.   furosemide (LASIX) 20 MG tablet Take 20 mg by mouth daily as needed.   Galcanezumab-gnlm (EMGALITY) 120 MG/ML SOAJ Inject 120 mg into the skin every 30 (thirty) days.   glucose blood (ACCU-CHEK AVIVA PLUS) test strip Use 2 (two) times daily   levocetirizine (XYZAL) 5 MG tablet Take 5 mg by mouth at bedtime.    levothyroxine (SYNTHROID) 125 MCG tablet Take 200 mcg by mouth daily before breakfast.   lisinopril (ZESTRIL) 5 MG tablet Take 5 mg by mouth at bedtime.    magnesium oxide (MAG-OX) 400 MG tablet Take 1,200 mg by mouth 2 (two) times daily. Take 1200 mg by mouth in the morning and 1200 mg at bedtime   meclizine (ANTIVERT) 25 MG tablet Take 25 mg by mouth 3 (three) times daily as needed for dizziness.    metoprolol tartrate (LOPRESSOR) 50 MG tablet Take 100 mg by mouth 2 (two) times daily.   montelukast (SINGULAIR) 10 MG tablet Take 10 mg by mouth at bedtime.    Multiple Vitamin (TAB-A-VITE) TABS     naphazoline-pheniramine (NAPHCON-A) 0.025-0.3 % ophthalmic solution Place 1 drop into both eyes 4 (four) times daily as needed for eye irritation.   naproxen sodium (ALEVE) 220 MG tablet Take 220 mg by mouth as needed.   nitrofurantoin, macrocrystal-monohydrate, (MACROBID) 100 MG capsule Take 1 capsule (100 mg total) by mouth daily.   omeprazole (PRILOSEC) 40 MG capsule Take 40 mg by mouth in the morning and at bedtime.   oxyCODONE (OXY IR/ROXICODONE) 5 MG immediate release tablet Take 1 tablet (5 mg total) by mouth every 8 (eight) hours as needed for severe pain. Must last 30 days   [START ON 06/27/2022] oxyCODONE (OXY IR/ROXICODONE) 5 MG immediate release tablet Take 1 tablet (5 mg total) by mouth every 8 (eight) hours as needed for severe pain. Must last 30 days   [START ON 07/27/2022] oxyCODONE (OXY IR/ROXICODONE) 5 MG immediate release tablet Take 1 tablet (5 mg total) by mouth every 8 (eight) hours as needed for severe pain. Must last 30 days   OZEMPIC, 0.25 OR 0.5 MG/DOSE, 2 MG/1.5ML SOPN Inject 1 mg into the skin once a week.   pregabalin (LYRICA) 225 MG capsule Take 1 capsule (225 mg total) by mouth 2 (two) times daily.   QUEtiapine (SEROQUEL) 300 MG tablet Take 300 mg by mouth at bedtime.   rizatriptan (MAXALT) 10 MG tablet Take 1 and may repeat in 2 hours for MIgraines, alternate with the disintrgrating tablet.   rizatriptan (MAXALT-MLT) 10 MG disintegrating tablet Take 10 mg by mouth. Take 1 and may repeat in 2 hours  for Migraines   rosuvastatin (CRESTOR) 40 MG tablet Take 40 mg by mouth daily.    sertraline (ZOLOFT) 100 MG tablet Take 200 mg by mouth daily.    spironolactone (ALDACTONE) 25 MG tablet Take 37.5 mg by mouth 2 (two) times daily.    tiZANidine (ZANAFLEX) 4 MG tablet Take 1 tablet (4 mg total) by mouth 2 (two) times daily as needed for muscle spasms.   topiramate (TOPAMAX) 50 MG tablet Take 100 mg by mouth at bedtime.   No facility-administered encounter medications on file  as of 06/11/2022.    Social History: Social History   Tobacco Use   Smoking status: Never   Smokeless tobacco: Never  Vaping Use   Vaping Use: Never used  Substance Use Topics  Alcohol use: No    Alcohol/week: 0.0 standard drinks of alcohol   Drug use: No    Family Medical History: Family History  Problem Relation Age of Onset   Anxiety disorder Mother    Depression Mother    Alcohol abuse Mother    Diabetes Mother    Hypertension Mother    Kidney cancer Mother    Sleep apnea Mother    Alcohol abuse Father    Anxiety disorder Father    Depression Father    Post-traumatic stress disorder Father    Kidney failure Father    COPD Father    Diabetes Father    Hypertension Father    Sleep apnea Father    Depression Brother    Diabetes Brother    Hypertension Brother    Sleep apnea Brother    Breast cancer Paternal Aunt 80   Breast cancer Paternal Aunt 75   Brain cancer Paternal Uncle 55   Bladder Cancer Neg Hx    Prostate cancer Neg Hx     Physical Examination: There were no vitals filed for this visit.  General: Patient is well developed, well nourished, calm, collected, and in no apparent distress. Attention to examination is appropriate.  Respiratory: Patient is breathing without any difficulty.   NEUROLOGICAL:     Awake, alert, oriented to person, place, and time.  Speech is clear and fluent. Fund of knowledge is appropriate.   Cranial Nerves: Pupils equal round and reactive to light.  Facial tone is symmetric.  Facial sensation is symmetric.  ROM of spine:  Limited ROM of lumbar spine with pain  No abnormal lesions on exposed skin.   She has diffuse lower lumbar tenderness.   Strength: Side Biceps Triceps Deltoid Interossei Grip Wrist Ext. Wrist Flex.  R '5 5 5 5 5 5 5  '$ L '5 5 5 5 5 5 5   '$ Side Iliopsoas Quads Hamstring PF DF EHL  R '5 5 5 5 5 5  '$ L '5 5 5 5 5 5   '$ Reflexes are 2+ and symmetric at the biceps, triceps, brachioradialis, patella and  achilles.   Hoffman's is absent.  Clonus is not present.   Bilateral upper and lower extremity sensation is intact to light touch, but diminished in right lateral thigh.   Gait is slow and she walks hunched forward. Can take a few steps in the exam room.  She is using Transport planner.   Medical Decision Making  Imaging: Lumbar xrays 10/26/21:  FINDINGS: Lumbar spine numbered the lowest segmented appearing lumbar shaped vertebrae on lateral view as L5. IVC filter noted in good anatomic position. Diffuse osteopenia. Lumbar scoliosis concave right. Diffuse multilevel degenerative change. 6 mm anterolisthesis L3 on L4. 11 mm anterolisthesis L4 on L5. Mild L4-L5 anterior angulation deformity noted with flexion. No extension abnormality. No evidence of fracture. Left hip replacement.   IMPRESSION: Diffuse osteopenia. Lumbar scoliosis concave right. Diffuse multilevel degenerative change. 6 mm anterolisthesis L3 on L4. 11 mm anterolisthesis L4 on L5. Mild L4-L5 anterior angulation deformity noted with flexion. No extension abnormality. No evidence of fracture.     Electronically Signed   By: Marcello Moores  Register M.D.   On: 10/27/2021 07:22  Lumbar MRI 11/10/21:  FINDINGS: Segmentation: Standard; the lowest formed disc space is designated L5-S1.   Alignment: There is trace anterolisthesis of L4 on L5. Note that anterolisthesis of L3 on L4 and L4 on L5 seen on the radiographs from 10/26/2021 is significantly decreased on the current  study.   Vertebrae: Vertebral body heights are preserved. There is ill-defined T1 hypointensity with corresponding STIR hyperintensity in the L1 spinous process (7-18). This appears to correspond with ill-defined sclerosis which can be seen on prior CT abdomen/pelvis from 11/09/2018). There is no other focal marrow signal abnormality.   Conus medullaris and cauda equina: Conus extends to the T12-L1 level. Conus and cauda equina appear normal.    Paraspinal and other soft tissues: There is mild edema in the interspinous spaces at L2-L3 through L4-L5. The paraspinal soft tissues are otherwise unremarkable.   Disc levels:   There is disc desiccation with mild narrowing from L2-L3 through L5-S1. There is multilevel facet arthropathy, most advanced at L4-L5 with prominent associated effusions. Small effusions are also noted at L2-L3 and L5-S1.   T12-L1: No significant spinal canal or neural foraminal stenosis   L1-L2: No significant spinal canal or neural foraminal stenosis   L2-L3: There is a mild disc bulge, ligamentum flavum thickening, and mild bilateral facet arthropathy resulting in mild spinal canal stenosis and mild bilateral neural foraminal stenosis.   L3-L4: There is a mild disc bulge, ligamentum flavum thickening, and bilateral facet arthropathy without significant spinal canal or neural foraminal stenosis.   L4-L5: There is a diffuse disc bulge with bilateral foraminal components, ligamentum flavum thickening, and moderate bilateral facet arthropathy resulting in mild-to-moderate spinal canal stenosis with narrowing of the subarticular zones but no evidence of frank nerve root impingement, and moderate to severe left and moderate right neural foraminal stenosis   L5-S1: There is a prominent central protrusion and mild bilateral facet arthropathy resulting in narrowing of the bilateral subarticular zones with potential impingement of the traversing left S1 nerve root, and contact without evidence of impingement of the traversing right S1 nerve root. No significant neural foraminal stenosis.   IMPRESSION: 1. Trace anterolisthesis of L4 on L5. Note that anterolisthesis of L3 on L4 and L4 on L5 seen on the radiographs from 10/26/2021 is significantly decreased on the current study, suggesting dynamic translation. 2. Diffuse disc bulge and moderate bilateral facet arthropathy at L4-L5 resulting in  mild-to-moderate spinal canal stenosis and moderate to severe left and moderate right neural foraminal stenosis. There associated prominent facet joint effusions at this level, which could reflect a source of pain. 3. Prominent central disc protrusion at L5-S1 with potential impingement of the traversing left S1 nerve root. There is contact without evidence of impingement of the traversing right S1 nerve root. 4. Mild edema in the interspinous spaces at L2-L3 through L4-L5, nonspecific but could reflect mild ligamentous sprain. 5. Ill-defined focus of T1 hypointensity with associated STIR hyperintensity in the L1 spinous process appears to correspond to a sclerotic focus seen on the CT abdomen/pelvis of 11/09/2018. Given stability, this is favored to reflect a benign lesion, in the absence of known malignancy. Repeat lumbar spine MRI could be considered in approximately 6 months to ensure continued stability.     Electronically Signed   By: Valetta Mole M.D.   On: 11/10/2021 13:01  I have personally reviewed the images and agree with the above interpretation.   Assessment and Plan: Karen Lewis is a pleasant 51 y.o. female with constant mid back to lower back pain with some numbness/pain in her right lateral thigh. She notes weakness in right leg.   History of gastric bypass x 2 and bilateral THAs. May need bilateral TKAs at some point. She uses a scooter- has difficulty walking due to knee pain/left ankle pain. She  can only walk short distances.   Known slip at L3-L4 and L4-L5. She has spinal stenosis and moderate/severe left with moderate right foraminal stenosis L4-L5. Also with disc at L5-S1 that likely impinges on left SI nerve.   Question of T1 hypointensity L1 spinous process seen on CT/abdomen pelvis 11/09/21.   Treatment options discussed with patient and following plan made:   - Weight loss discussed and encouraged. BMI would need to be closer to 35 before she would be  candidate for spinal fusion.  - Discussed PT for lumbar spine. Not sure she would be able to tolerate this with her other joint issues (needs bilateral TKAs).  - Continue care with pain management. May consider repeat facet injections. Per patient, cannot have facet ablation unless BMI is 35.  - Will review with Dr. Izora Ribas and message her with any further options.  - Will also review MRI with Dr. Izora Ribas regarding area at L1 to see if he recommends repeat MRI scan.   I spent a total of 40 minutes in face-to-face and non-face-to-face activities related to this patient's care today.  Thank you for involving me in the care of this patient.   Geronimo Boot PA-C Dept. of Neurosurgery

## 2022-06-11 ENCOUNTER — Encounter: Payer: Self-pay | Admitting: Orthopedic Surgery

## 2022-06-11 ENCOUNTER — Ambulatory Visit: Payer: Medicare HMO | Admitting: Orthopedic Surgery

## 2022-06-11 VITALS — BP 144/84 | HR 83 | Ht 63.0 in | Wt 371.4 lb

## 2022-06-11 DIAGNOSIS — M5416 Radiculopathy, lumbar region: Secondary | ICD-10-CM

## 2022-06-11 DIAGNOSIS — M47816 Spondylosis without myelopathy or radiculopathy, lumbar region: Secondary | ICD-10-CM

## 2022-06-11 DIAGNOSIS — M4726 Other spondylosis with radiculopathy, lumbar region: Secondary | ICD-10-CM

## 2022-06-11 DIAGNOSIS — M4316 Spondylolisthesis, lumbar region: Secondary | ICD-10-CM

## 2022-06-11 DIAGNOSIS — M48061 Spinal stenosis, lumbar region without neurogenic claudication: Secondary | ICD-10-CM | POA: Diagnosis not present

## 2022-06-14 DIAGNOSIS — R399 Unspecified symptoms and signs involving the genitourinary system: Secondary | ICD-10-CM | POA: Diagnosis not present

## 2022-06-14 DIAGNOSIS — N3001 Acute cystitis with hematuria: Secondary | ICD-10-CM | POA: Diagnosis not present

## 2022-06-15 ENCOUNTER — Other Ambulatory Visit: Payer: Self-pay | Admitting: Orthopedic Surgery

## 2022-06-15 DIAGNOSIS — M4316 Spondylolisthesis, lumbar region: Secondary | ICD-10-CM

## 2022-06-15 DIAGNOSIS — M48061 Spinal stenosis, lumbar region without neurogenic claudication: Secondary | ICD-10-CM

## 2022-06-15 DIAGNOSIS — M5416 Radiculopathy, lumbar region: Secondary | ICD-10-CM

## 2022-06-15 DIAGNOSIS — M47816 Spondylosis without myelopathy or radiculopathy, lumbar region: Secondary | ICD-10-CM

## 2022-06-15 NOTE — Progress Notes (Signed)
Patient reviewed with Dr. Izora Ribas. She is not a surgical candidate due to her BMI. BMI would need to be below 50 to consider any type of elective surgical procedure. He recommends she follow back up with the weight loss clinic.   He does agree with repeat lumbar MRI to look at possible sclerotic lesion seen in L1 spinous process.   Message sent to patient. Will order MRI once she lets me know where she wants to go. Will set up phone visit once I have the results.

## 2022-06-17 NOTE — Addendum Note (Signed)
Addended byGeronimo Boot on: 06/17/2022 01:56 PM   Modules accepted: Orders

## 2022-06-26 NOTE — Progress Notes (Unsigned)
PROVIDER NOTE: Information contained herein reflects review and annotations entered in association with encounter. Interpretation of such information and data should be left to medically-trained personnel. Information provided to patient can be located elsewhere in the medical record under "Patient Instructions". Document created using STT-dictation technology, any transcriptional errors that may result from process are unintentional.    Patient: Karen Lewis  Service Category: E/M  Provider: Gaspar Cola, MD  DOB: 18-Jun-1971  DOS: 06/29/2022  Referring Provider: Idelle Crouch, MD  MRN: [id number]  Specialty: Interventional Pain Management  PCP: Idelle Crouch, MD  Type: Established Patient  Setting: Ambulatory outpatient    Location: Office  Delivery: Face-to-face     HPI  Karen Lewis, a 51 y.o. year old female, is here today because of her Trigger point of right shoulder region [M25.511]. Karen Lewis primary complain today is Knee Pain (Bilateral ), Foot Pain (Left ), and Neck Pain (Right, goes into shoulder ) Last encounter: My last encounter with her was on 06/08/2022. Pertinent problems: Karen Lewis has Fibromyalgia; Chronic knee pain (1ry area of Pain) (Bilateral) (R>L); Chronic low back pain (3ry area of Pain) (Bilateral) (R>L) w/o sciatica; Lumbar facet syndrome (Bilateral) (R>L); Secondary osteoarthritis of multiple sites; Grade 1  Anterolisthesis of L3/L4 & L4/L5 (1.4 cm); Chronic hip pain (2ry area of Pain) (Right); S/P THR (total hip replacement) (Left); Lumbar spondylosis; Chronic pain syndrome; Neurogenic pain; Upper extremity pain; Chronic shoulder pain (Left); Osteoarthritis of shoulder (Left); Osteoarthritis of hip (Right); Secondary Osteoarthritis of knee (Bilateral) (R>L); Spondylosis without myelopathy or radiculopathy, lumbosacral region; Panniculitis; Leg swelling; Edema of both legs; History of total hip replacement (Bilateral); S/P THR (total hip replacement) (Right)  (11/27/2019); Pain and numbness of left upper extremity; Cervical radiculitis (Left); Bilateral primary osteoarthritis of knee; DDD (degenerative disc disease), lumbosacral; Osteoarthritis of lumbar spine without myelopathy or radiculopathy; Lumbosacral radiculopathy at S1 (Left); Tricompartment osteoarthritis of knees (Bilateral); Lumbosacral radiculopathy at L2 (Right); Abnormal MRI, lumbar spine (11/10/2021); Chronic thigh pain (Right); Weakness of leg (Right); Lumbar interspinous bursitis (L2-L5); Spinal enthesopathy of lumbar region [facility]); Lumbar facet arthropathy (Multilevel) (Bilateral); Lumbar central spinal stenosis, w/o claudication (L2-3, L4-5, L5-S1); Lumbar foraminal stenosis (Bilateral: L2-3, L4-5) (L>R); Lumbosacral lateral recess stenosis (Bilateral: L4-5, L5-S1); and Trigger point of right shoulder region on their pertinent problem list. Pain Assessment: Severity of Chronic pain is reported as a 6 /10. Location: Knee (see visit info for other pain sites.  foot pain is = to 10) Left, Right/knee pain radiates outward, inward and down the leg.. Onset: More than a month ago. Quality: Discomfort, Constant, Aching (grinding, popping and shifting of the knee which then hits the nerve causing electrical shock.). Timing: Constant. Modifying factor(s): very little, rest. Vitals:  height is '5\' 3"'  (1.6 m) and weight is 371 lb (168.3 kg) (abnormal). Her temporal temperature is 97.3 F (36.3 C) (abnormal). Her blood pressure is 137/86 and her pulse is 89. Her respiration is 16 and oxygen saturation is 98%.   Reason for encounter: post-procedure evaluation and assessment.  The patient refers having attained 100% relief of the pain in the areas of the knee that lasted for approximately 2 days after which the pain just came back the same way was before.  The reason for this is because she is already bone-on-bone.  The patient indicates that her hip replacements were done by Dr. Rudene Christians.  According to her, they are  planning on fitting her with some knee braces.  However, having  looked at her knee x-rays, I can only see one solution to this problem and that is having bilateral knee replacement.  In the case of the right knee she has severe tricompartmental osteoarthritis, greatest at the medial and patellofemoral compartment with bone-on-bone.  In the case of the left knee she has moderate tricompartmental osteoarthritis with clear evidence of decreased joint space.  We have been managing her knee pain with steroids, extended release steroids, Hyalgan, and Monovisc.  In total, she has had over 21 bilateral knee injections, and they simply do not last long due to her weight and her current pathology.  Today I have communicated to the patient that I am very concerned regarding her use of steroids and how this can have a detrimental effect on her system including endocrine problems and mood disturbances.  In fact, today she was very tearful explaining that not only does she have all of these chronic pain problems, but now she is having some issues with her husband.  She does have a counselor that she talks to, but today she also had a long conversation with one of her nurses Cecille Rubin), who ultimately came into my office to present the case and broke down crying as well because of the patient's situation.  Unfortunately, the psychosocial issues are outside of my realm of expertise but I do feel that to avoid further contributing with possible mood swings, we need to restrict the amount of steroids that she is currently using.  Today I have communicated this to the patient.  Post-procedure evaluation    Procedure:          Anesthesia, Analgesia, Anxiolysis:  Procedure #1: Mid trapezius Trigger Point Injection (1-2 muscle groups) #1  CPT: 20552 Primary Purpose:  Diagnostic/therapeutic  Region: Posterior  shoulder Level: Cervico-thoracic Target Area: Trapezius Trigger Point Approach: Percutaneous, ipsilateral  approach. Laterality: Right Paraspinal  Type: Local Anesthesia Local Anesthetic: Lidocaine 1-2% Sedation: None  Indication(s):  Analgesia Route: Infiltration ([place]/IM) IV Access: N/A   Position: Sitting   1. Trigger point of right shoulder region    Procedure #2: Monovisc Intra-articular Knee Injection #1  Laterality: Bilateral (-50) Level/approach: Lateral Imaging guidance: None required ([id number]) Anesthesia: Local anesthesia (1-2% Lidocaine) Anxiolysis: IV Versed         Sedation:                         DOS: 05/25/2022  Performed by: Gaspar Cola, MD  Purpose: Diagnostic/Therapeutic Indications: Knee arthralgia associated to osteoarthritis of the knee 1. Chronic knee pain (1ry area of Pain) (Bilateral) (R>L)   2. Secondary Osteoarthritis of knee (Bilateral) (R>L)   3. Tricompartment osteoarthritis of knees (Bilateral)   4. Class 3 severe obesity due to excess calories with serious comorbidity and body mass index (BMI) of 60.0 to 69.9 in adult [facility])    NAS-11 score:  Pre-procedure: 4 /10  Post-procedure: 0-No pain/10      Effectiveness:  Initial hour after procedure: 100 %. Subsequent 4-6 hours post-procedure: 100 %. Analgesia past initial 6 hours: 100 % (after 2 days the numbness started to creep back in, achy and now pain is back to the same intensity as pre procedure). Ongoing improvement:  Analgesic: The patient indicates that the benefit lasted for only 2 days after which the pain came back. Function: Back to baseline ROM: Back to baseline  Pharmacotherapy Assessment  Analgesic: Oxycodone IR 5 mg 1 tab PO 5X/day (#150/mo) (25 mg/day) MME/day: 37.5 mg/day.  Monitoring: [place] PMP: PDMP reviewed during this encounter.       Pharmacotherapy: No side-effects or adverse reactions reported. Compliance: No problems identified. Effectiveness: Clinically acceptable.  [name], RN  06/29/2022  1:11 PM  Sign when Signing Visit Safety precautions to  be maintained throughout the outpatient stay will include: orient to surroundings, keep bed in low position, maintain call bell within reach at all times, provide assistance with transfer out of bed and ambulation.     No results found for: "CBDTHCR" No results found for: "D8THCCBX" No results found for: "D9THCCBX"  UDS:  Summary  Date Value Ref Range Status  10/26/2021 Note  Final    Comment:    ==================================================================== [employer] Select 13 (MW) ==================================================================== Test                             Result       Flag       Units  Drug Present and Declared for Prescription Verification   7-aminoclonazepam              48           EXPECTED   ng/mg creat    7-aminoclonazepam is an expected metabolite of clonazepam. Source of    clonazepam is a scheduled prescription medication.    Noroxycodone                   518          EXPECTED   ng/mg creat    Noroxycodone is an expected metabolite of oxycodone. Sources of    oxycodone include scheduled prescription medications.  Drug Absent but Declared for Prescription Verification   Oxycodone                      Not Detected UNEXPECTED ng/mg creat    Oxycodone is almost always present in patients taking this drug    consistently.  Absence of oxycodone could be due to lapse of time    since the last dose or unusual pharmacokinetics (rapid metabolism).    Butalbital                     Not Detected UNEXPECTED ==================================================================== Test                      Result    Flag   Units      Ref Range   Creatinine              88               mg/dL      >=20 ==================================================================== Declared Medications:  The flagging and interpretation on this report are based on the  following declared medications.  Unexpected results may arise from  inaccuracies in the declared  medications.   **Note: The testing scope of this panel includes these medications:   Butalbital (Fioricet)  Clonazepam (Klonopin)  Oxycodone   **Note: The testing scope of this panel does not include the  following reported medications:   Acetaminophen (Tylenol)  Acetaminophen (Fioricet)  Albuterol (Ventolin HFA)  Amitriptyline (Elavil)  Benzonatate (Tessalon)  Biotin  Caffeine (Fioricet)  Calcium (Tums)  Diclofenac (Voltaren)  Diphenhydramine (Benadryl)  Dupilumab (Dupixent)  Epinephrine  Eye Drop  Famotidine (Pepcid)  Fluticasone (Breo)  Furosemide (Lasix)  Guaifenesin (Robitussin)  Levocetirizine (Xyzal)  Levothyroxine (Synthroid)  Lisinopril (Zestril)  Magnesium (Mag-Ox)  Meclizine (Antivert)  Metformin (Glucophage)  Metoprolol (Lopressor)  Montelukast (Singulair)  Multivitamin  Naloxone (Narcan)  Naproxen (Aleve)  Nitrofurantoin (Macrobid)  Omeprazole (Prilosec)  Pregabalin (Lyrica)  Probiotic  Quetiapine (Seroquel)  Rizatriptan (Maxalt)  Rosuvastatin (Crestor)  Semaglutide (Ozempic)  Spironolactone (Aldactone)  Tizanidine (Zanaflex)  Vilanterol (Breo)  Vitamin B12  Vitamin C  Vitamin D2  Zolpidem (Ambien) ==================================================================== For clinical consultation, please call [phone]. ====================================================================       ROS  Constitutional: Denies any fever or chills Gastrointestinal: No reported hemesis, hematochezia, vomiting, or acute GI distress Musculoskeletal: Denies any acute onset joint swelling, redness, loss of ROM, or weakness Neurological: No reported episodes of acute onset apraxia, aphasia, dysarthria, agnosia, amnesia, paralysis, loss of coordination, or loss of consciousness  Medication Review  B-12, Biotin, EPINEPHrine, Galcanezumab-gnlm, QUEtiapine, Semaglutide(0.25 or 0.5MG/DOS), Tab-A-Vite, acetaminophen, acidophilus, albuterol, amitriptyline,  butalbital-acetaminophen-caffeine, calcium carbonate, diclofenac sodium, diphenhydrAMINE, ergocalciferol, famotidine, fluticasone furoate-vilanterol, furosemide, glucose blood, levocetirizine, levothyroxine, lisinopril, magnesium oxide, meclizine, metoprolol tartrate, montelukast, naphazoline-pheniramine, naproxen sodium, nitrofurantoin (macrocrystal-monohydrate), omeprazole, oxyCODONE, pregabalin, rizatriptan, rosuvastatin, sertraline, spironolactone, tiZANidine, topiramate, and vitamin C  History Review  Allergy: Karen Lewis is allergic to bactrim [sulfamethoxazole-trimethoprim], omalizumab, ciprofloxacin, shellfish allergy, and nsaids. Drug: Karen Lewis  reports no history of drug use. Alcohol:  reports no history of alcohol use. Tobacco:  reports that she has never smoked. She has never used smokeless tobacco. Social: Karen Lewis  reports that she has never smoked. She has never used smokeless tobacco. She reports that she does not drink alcohol and does not use drugs. Medical:  has a past medical history of Anemia, Anginal pain ([place]), Anxiety, Arthralgia of hip (07/29/2015), Arthritis, Arthritis, degenerative (07/29/2015), Asthma, Cephalalgia (07/25/2014), Dependence on unknown drug ([place]), Depression, Diabetes mellitus without complication ([place]), Difficult intubation, Dysrhythmia, Eczema, Fibromyalgia, Gastritis, GERD (gastroesophageal reflux disease), Gonalgia (07/29/2015), Gout, H/O cardiovascular disorder (03/10/2015), H/O surgical procedure (12/05/2012), H/O thyroid disease (03/10/2015), Headache, Herpes, History of artificial joint (07/29/2015), History of hiatal hernia, Hypertension, Hypomagnesemia, Hypothyroidism, IDA (iron deficiency anemia) (05/28/2019), LBP (low back pain) (07/29/2015), Neuromuscular disorder ([place]), Obesity, PCOS (polycystic ovarian syndrome), Primary osteoarthritis of both knees (07/29/2015), Sleep apnea, Thyroid nodule (bilateral), and Umbilical hernia. Surgical: Ms. Alkins  has a past  surgical history that includes Laparoscopic partial gastrectomy; Shoulder arthroscopy (Right); Carpal tunnel release (Bilateral); Diagnostic laparoscopy; Cholecystectomy; Trigger finger release (Right); Thyroidectomy (N/A, 11/12/2015); left trigger finger; Roux-en-Y Gastric Bypass (06/03/2017); Hiatal hernia repair; peniculectomy (N/A, 07/05/2018); Total hip arthroplasty (Right, 11/27/2019); Joint replacement (Bilateral, hip); Appendectomy; Trigger finger release (Right, 04/24/2020); Colonoscopy with propofol (N/A, 02/11/2021); and Esophagogastroduodenoscopy (egd) with propofol (N/A, 02/11/2021). Family: family history includes Alcohol abuse in her father and mother; Anxiety disorder in her father and mother; Brain cancer (age of onset: 65) in her paternal uncle; Breast cancer (age of onset: 49) in her paternal aunt and paternal aunt; COPD in her father; Depression in her brother, father, and mother; Diabetes in her brother, father, and mother; Hypertension in her brother, father, and mother; Kidney cancer in her mother; Kidney failure in her father; Post-traumatic stress disorder in her father; Sleep apnea in her brother, father, and mother.  Laboratory Chemistry Profile   Renal Lab Results  Component Value Date   BUN 19 04/28/2021   CREATININE 1.06 (H) 04/28/2021   GFRAA >60 04/16/2020   GFRNONAA >60 04/28/2021    Hepatic Lab Results  Component Value Date   AST 34 04/22/2021   ALT 34 04/22/2021   ALBUMIN 4.1 04/22/2021   ALKPHOS 63 04/22/2021  Electrolytes Lab Results  Component Value Date   NA 134 (L) 04/28/2021   K 4.5 04/28/2021   CL 101 04/28/2021   CALCIUM 8.4 (L) 04/28/2021   MG 2.1 04/28/2021   PHOS 5.5 (H) 05/22/2019    Bone Lab Results  Component Value Date   VD25OH 27.4 (L) 11/24/2015   VD125OH2TOT 41.5 11/24/2015    Inflammation (CRP: Acute Phase) (ESR: Chronic Phase) Lab Results  Component Value Date   CRP 0.7 09/27/2019   ESRSEDRATE 30 (H) 09/27/2019         Note:  Above Lab results reviewed.  Recent Imaging Review  DG PAIN CLINIC C-ARM 1-60 MIN NO REPORT Fluoro was used, but no Radiologist interpretation will be provided.  Please refer to "NOTES" tab for provider progress note. Note: Reviewed        Physical Exam  General appearance: Well nourished, well developed, and well hydrated. In no apparent acute distress Mental status: Alert, oriented x 3 (person, place, & time)       Respiratory: No evidence of acute respiratory distress Eyes: PERLA Vitals: BP 137/86 (BP Location: Left Arm, Patient Position: Sitting, Cuff Size: Large)   Pulse 89   Temp (!) 97.3 F (36.3 C) (Temporal)   Resp 16   Ht '5\' 3"'  (1.6 m)   Wt (!) 371 lb (168.3 kg)   SpO2 98%   BMI 65.72 kg/m  BMI: Estimated body mass index is 65.72 kg/m as calculated from the following:   Height as of this encounter: '5\' 3"'  (1.6 m).   Weight as of this encounter: 371 lb (168.3 kg). Ideal: Ideal body weight: 52.4 kg (115 lb 8.3 oz) Adjusted ideal body weight: 98.8 kg (217 lb 11.4 oz)  Assessment   Diagnosis Status  1. Trigger point of right shoulder region   2. Chronic knee pain (1ry area of Pain) (Bilateral) (R>L)   3. Secondary Osteoarthritis of knee (Bilateral) (R>L)   4. Tricompartment osteoarthritis of knees (Bilateral)    Controlled Controlled Controlled   Updated Problems: No problems updated.   Plan of Care  Problem-specific:  No problem-specific Assessment & Plan notes found for this encounter.  Karen Lewis has a current medication list which includes the following long-term medication(s): albuterol, diphenhydramine, furosemide, levothyroxine, lisinopril, magnesium oxide, metoprolol tartrate, montelukast, oxycodone, oxycodone, [START ON 07/27/2022] oxycodone, pregabalin, rizatriptan, rizatriptan, and spironolactone.  Pharmacotherapy (Medications Ordered): No orders of the defined types were placed in this encounter.  Orders:  No orders of the defined types  were placed in this encounter.  Follow-up plan:   No follow-ups on file.     Interventional Therapies  Risk  Complexity Considerations:   Estimated body mass index is 60.59 kg/m as calculated from the following:   Height as of 06/18/21: '5\' 4"'  (1.626 m).   Weight as of 06/18/21: 353 lb (160.1 kg).     NOTE: NO RFA until BMI less or equal to 35.  (Gastric bypass done on 06/03/2017) Iodine allergy  Contrast dye allergy  Shellfish allergy    Planned  Pending:      Under consideration:   Diagnostic bilateral genicular NB    Completed:   Therapeutic right L2-3 LESI x1 (04/29/2022) (100/100/60/60)  Therapeutic bilateral IA Monovisc knee inj. x1 (05/25/2022)  Therapeutic right trapezius TPI/MNB x1 (05/25/2022)  Therapeutic bilateral IA Zilretta knee inj. x4 (03/25/2022) (100/100/80/80)  Therapeutic bilateral IA Hyalgan knee inj. x16 (06/24/2020) (100/100/60/60)  Palliative/therapeutic bilateral lumbar facet MBB x8 (08/25/2021) (100/100/60/60)  Therapeutic right  IA hip injection x3 (07/07/2017) (100/50/25/>50)  Therapeutic left IA shoulder (glenohumeral joint) injection x1 (05/31/2017) (50/0/100/100)    Completed by other providers:   Therapeutic bilateral THR (total hip replacements) (Dr. Rudene Christians) (Right: 11/27/2019) (Left: 07/29/2015)    Therapeutic  Palliative (PRN) options:   Palliative bilateral knee injections  Palliative bilateral lumbar facet MBBs       Recent Visits Date Type Provider Dept  06/08/22 Office Visit Milinda Pointer, MD [facility]  05/25/22 Procedure visit Milinda Pointer, MD [facility]  05/24/22 Office Visit Milinda Pointer, MD [facility]  05/13/22 Office Visit Milinda Pointer, MD [facility]  04/29/22 Procedure visit Milinda Pointer, MD [facility]  04/13/22 Office Visit Milinda Pointer, MD [facility]  Showing recent visits within past 90 days and meeting all other  requirements Today's Visits Date Type Provider Dept  06/29/22 Office Visit Milinda Pointer, MD [facility]  Showing today's visits and meeting all other requirements Future Appointments Date Type Provider Dept  08/18/22 Appointment Milinda Pointer, MD [facility]  Showing future appointments within next 90 days and meeting all other requirements  I discussed the assessment and treatment plan with the patient. The patient was provided an opportunity to ask questions and all were answered. The patient agreed with the plan and demonstrated an understanding of the instructions.  Patient advised to call back or seek an in-person evaluation if the symptoms or condition worsens.  Duration of encounter: 30 minutes.  Total time on encounter, as per AMA guidelines included both the face-to-face and non-face-to-face time personally spent by the physician and/or other qualified health care professional(s) on the day of the encounter (includes time in activities that require the physician or other qualified health care professional and does not include time in activities normally performed by clinical staff). Physician's time may include the following activities when performed: preparing to see the patient (eg, review of tests, pre-charting review of records) obtaining and/or reviewing separately obtained history performing a medically appropriate examination and/or evaluation counseling and educating the patient/family/caregiver ordering medications, tests, or procedures referring and communicating with other health care professionals (when not separately reported) documenting clinical information in the electronic or other health record independently interpreting results (not separately reported) and communicating results to the patient/ family/caregiver care coordination (not separately reported)  Note by: Gaspar Cola, MD Date: 06/29/2022; Time: 2:37 PM

## 2022-06-29 ENCOUNTER — Encounter: Payer: Self-pay | Admitting: Pain Medicine

## 2022-06-29 ENCOUNTER — Ambulatory Visit (HOSPITAL_BASED_OUTPATIENT_CLINIC_OR_DEPARTMENT_OTHER): Payer: Medicare HMO | Admitting: Pain Medicine

## 2022-06-29 ENCOUNTER — Ambulatory Visit
Admission: RE | Admit: 2022-06-29 | Discharge: 2022-06-29 | Disposition: A | Discharge status: Home / Self Care | Payer: Medicare HMO | Source: Ambulatory Visit | Attending: Orthopedic Surgery | Admitting: Orthopedic Surgery

## 2022-06-29 VITALS — BP 137/86 | HR 89 | Temp 97.3°F | Resp 16 | Ht 63.0 in | Wt 371.0 lb

## 2022-06-29 DIAGNOSIS — G8929 Other chronic pain: Secondary | ICD-10-CM | POA: Insufficient documentation

## 2022-06-29 DIAGNOSIS — M48061 Spinal stenosis, lumbar region without neurogenic claudication: Secondary | ICD-10-CM | POA: Insufficient documentation

## 2022-06-29 DIAGNOSIS — M5416 Radiculopathy, lumbar region: Secondary | ICD-10-CM | POA: Diagnosis not present

## 2022-06-29 DIAGNOSIS — M25511 Pain in right shoulder: Secondary | ICD-10-CM | POA: Insufficient documentation

## 2022-06-29 DIAGNOSIS — M170 Bilateral primary osteoarthritis of knee: Secondary | ICD-10-CM | POA: Diagnosis not present

## 2022-06-29 DIAGNOSIS — M174 Other bilateral secondary osteoarthritis of knee: Secondary | ICD-10-CM | POA: Insufficient documentation

## 2022-06-29 DIAGNOSIS — M25561 Pain in right knee: Secondary | ICD-10-CM | POA: Diagnosis not present

## 2022-06-29 DIAGNOSIS — M47816 Spondylosis without myelopathy or radiculopathy, lumbar region: Secondary | ICD-10-CM | POA: Diagnosis not present

## 2022-06-29 DIAGNOSIS — M4316 Spondylolisthesis, lumbar region: Secondary | ICD-10-CM | POA: Diagnosis not present

## 2022-06-29 DIAGNOSIS — M25562 Pain in left knee: Secondary | ICD-10-CM | POA: Insufficient documentation

## 2022-06-29 DIAGNOSIS — M5126 Other intervertebral disc displacement, lumbar region: Secondary | ICD-10-CM | POA: Diagnosis not present

## 2022-06-29 NOTE — Progress Notes (Signed)
Safety precautions to be maintained throughout the outpatient stay will include: orient to surroundings, keep bed in low position, maintain call bell within reach at all times, provide assistance with transfer out of bed and ambulation.  

## 2022-07-02 ENCOUNTER — Encounter: Payer: Self-pay | Admitting: Orthopedic Surgery

## 2022-07-02 NOTE — Telephone Encounter (Signed)
MRI of lumbar spine dated 06/29/22:  FINDINGS: Segmentation:  Standard.   Alignment:  Unchanged trace anterolisthesis of L4 on L5.   Vertebrae: Unchanged 1.8 cm T1 hypointense, STIR hyperintense focus in the base of the spinous process and lamina at L1 with associated sclerosis again noted on an abdominal CT from 11/09/2018. Unchanged subcentimeter STIR hyperintense focus in the region of the left S1 pedicle. No new bone lesion, fracture, or significant marrow edema.   Conus medullaris and cauda equina: Conus extends to the L1 level. Conus and cauda equina appear normal.   Paraspinal and other soft tissues: Persistent mild interspinous soft tissue edema or fluid at L3-4 and L4-5.   Disc levels:   Disc desiccation from L2-3 through L5-S1 with up to mild associated disc space narrowing.   T12-L1 mild facet hypertrophy without disc herniation or stenosis, unchanged.   L1-2: Mild facet hypertrophy without disc herniation or stenosis, unchanged.   L2-3: Disc bulging, prominent dorsal epidural fat, and moderate facet and ligamentum flavum hypertrophy result in borderline spinal stenosis and mild right greater than left neural foraminal stenosis, unchanged.   L3-4: Disc bulging and moderate facet hypertrophy result in borderline to mild bilateral neural foraminal stenosis without spinal stenosis, unchanged.   L4-5: Anterolisthesis with bulging uncovered disc and severe facet and ligamentum flavum hypertrophy result in mild spinal stenosis, mild-to-moderate bilateral lateral recess stenosis, and severe left greater than right neural foraminal stenosis, unchanged. Persistent large bilateral facet joint effusions which can be seen in the setting of instability. Potential bilateral L4 and L5 nerve root impingement.   L5-S1: A large central disc protrusion and mild facet hypertrophy result in mild right and mild-to-moderate left lateral recess stenosis without significant  generalized spinal stenosis or neural foraminal stenosis, unchanged.   IMPRESSION: 1. Unchanged lesion in the L1 posterior elements. Given stability (including presence on a 2020 CT) and absence of a known malignancy elsewhere, this is most compatible with a benign etiology such as an atypical hemangioma. 2. Unchanged lumbar disc and facet degeneration. 3. Severe facet arthrosis at L4-5 with grade 1 anterolisthesis, mild-to-moderate lateral recess stenosis, and severe bilateral neural foraminal stenosis. 4. Large central disc protrusion at L5-S1 with mild-to-moderate lateral recess stenosis.     Electronically Signed   By: Logan Bores M.D.   On: 07/01/2022 12:09  I have personally reviewed the images and agree with the above interpretation.  MRI findings are similar to previous MRI with slip at L3-L4 and L4-L5. She has spinal stenosis and severe bilateral foraminal stenosis L4-L5. Also with disc at L5-S1 that likely impinges on left SI nerve.   Area of concern at posterior L1 is unchanged and is likely benign per radiology (atypical hemangioma).   MyChart message sent to patient.

## 2022-07-05 DIAGNOSIS — R399 Unspecified symptoms and signs involving the genitourinary system: Secondary | ICD-10-CM | POA: Diagnosis not present

## 2022-07-07 DIAGNOSIS — F331 Major depressive disorder, recurrent, moderate: Secondary | ICD-10-CM | POA: Diagnosis not present

## 2022-07-12 ENCOUNTER — Encounter (INDEPENDENT_AMBULATORY_CARE_PROVIDER_SITE_OTHER): Payer: Self-pay

## 2022-07-15 DIAGNOSIS — F331 Major depressive disorder, recurrent, moderate: Secondary | ICD-10-CM | POA: Diagnosis not present

## 2022-07-19 DIAGNOSIS — J069 Acute upper respiratory infection, unspecified: Secondary | ICD-10-CM | POA: Diagnosis not present

## 2022-07-19 DIAGNOSIS — J45909 Unspecified asthma, uncomplicated: Secondary | ICD-10-CM | POA: Diagnosis not present

## 2022-07-19 DIAGNOSIS — Z03818 Encounter for observation for suspected exposure to other biological agents ruled out: Secondary | ICD-10-CM | POA: Diagnosis not present

## 2022-07-19 DIAGNOSIS — R399 Unspecified symptoms and signs involving the genitourinary system: Secondary | ICD-10-CM | POA: Diagnosis not present

## 2022-07-30 DIAGNOSIS — M1712 Unilateral primary osteoarthritis, left knee: Secondary | ICD-10-CM | POA: Diagnosis not present

## 2022-07-30 DIAGNOSIS — M170 Bilateral primary osteoarthritis of knee: Secondary | ICD-10-CM | POA: Diagnosis not present

## 2022-07-30 DIAGNOSIS — M25561 Pain in right knee: Secondary | ICD-10-CM | POA: Diagnosis not present

## 2022-07-30 DIAGNOSIS — M25462 Effusion, left knee: Secondary | ICD-10-CM | POA: Diagnosis not present

## 2022-07-30 DIAGNOSIS — G8929 Other chronic pain: Secondary | ICD-10-CM | POA: Diagnosis not present

## 2022-07-30 DIAGNOSIS — M1711 Unilateral primary osteoarthritis, right knee: Secondary | ICD-10-CM | POA: Diagnosis not present

## 2022-07-30 DIAGNOSIS — M25562 Pain in left knee: Secondary | ICD-10-CM | POA: Diagnosis not present

## 2022-08-04 DIAGNOSIS — E890 Postprocedural hypothyroidism: Secondary | ICD-10-CM | POA: Diagnosis not present

## 2022-08-04 DIAGNOSIS — E1169 Type 2 diabetes mellitus with other specified complication: Secondary | ICD-10-CM | POA: Diagnosis not present

## 2022-08-04 DIAGNOSIS — E1159 Type 2 diabetes mellitus with other circulatory complications: Secondary | ICD-10-CM | POA: Diagnosis not present

## 2022-08-04 DIAGNOSIS — E209 Hypoparathyroidism, unspecified: Secondary | ICD-10-CM | POA: Diagnosis not present

## 2022-08-04 DIAGNOSIS — E785 Hyperlipidemia, unspecified: Secondary | ICD-10-CM | POA: Diagnosis not present

## 2022-08-04 DIAGNOSIS — I152 Hypertension secondary to endocrine disorders: Secondary | ICD-10-CM | POA: Diagnosis not present

## 2022-08-07 ENCOUNTER — Other Ambulatory Visit: Payer: Self-pay

## 2022-08-07 ENCOUNTER — Encounter (HOSPITAL_COMMUNITY): Payer: Self-pay | Admitting: *Deleted

## 2022-08-07 ENCOUNTER — Inpatient Hospital Stay (HOSPITAL_COMMUNITY)
Admission: EM | Admit: 2022-08-07 | Discharge: 2022-08-09 | DRG: 683 | Disposition: A | Discharge status: Home Health | Payer: Medicare HMO | Attending: Internal Medicine | Admitting: Internal Medicine

## 2022-08-07 ENCOUNTER — Emergency Department (HOSPITAL_COMMUNITY): Payer: Medicare HMO

## 2022-08-07 DIAGNOSIS — E890 Postprocedural hypothyroidism: Secondary | ICD-10-CM | POA: Diagnosis present

## 2022-08-07 DIAGNOSIS — Z9884 Bariatric surgery status: Secondary | ICD-10-CM

## 2022-08-07 DIAGNOSIS — Z841 Family history of disorders of kidney and ureter: Secondary | ICD-10-CM

## 2022-08-07 DIAGNOSIS — Z96643 Presence of artificial hip joint, bilateral: Secondary | ICD-10-CM | POA: Diagnosis present

## 2022-08-07 DIAGNOSIS — Z8051 Family history of malignant neoplasm of kidney: Secondary | ICD-10-CM

## 2022-08-07 DIAGNOSIS — Z7951 Long term (current) use of inhaled steroids: Secondary | ICD-10-CM

## 2022-08-07 DIAGNOSIS — Z833 Family history of diabetes mellitus: Secondary | ICD-10-CM | POA: Diagnosis not present

## 2022-08-07 DIAGNOSIS — I152 Hypertension secondary to endocrine disorders: Secondary | ICD-10-CM | POA: Diagnosis present

## 2022-08-07 DIAGNOSIS — M792 Neuralgia and neuritis, unspecified: Secondary | ICD-10-CM | POA: Diagnosis not present

## 2022-08-07 DIAGNOSIS — Z803 Family history of malignant neoplasm of breast: Secondary | ICD-10-CM | POA: Diagnosis not present

## 2022-08-07 DIAGNOSIS — J45909 Unspecified asthma, uncomplicated: Secondary | ICD-10-CM | POA: Diagnosis present

## 2022-08-07 DIAGNOSIS — E1140 Type 2 diabetes mellitus with diabetic neuropathy, unspecified: Secondary | ICD-10-CM | POA: Diagnosis present

## 2022-08-07 DIAGNOSIS — E1169 Type 2 diabetes mellitus with other specified complication: Secondary | ICD-10-CM | POA: Diagnosis present

## 2022-08-07 DIAGNOSIS — E7800 Pure hypercholesterolemia, unspecified: Secondary | ICD-10-CM | POA: Diagnosis present

## 2022-08-07 DIAGNOSIS — E860 Dehydration: Secondary | ICD-10-CM | POA: Diagnosis present

## 2022-08-07 DIAGNOSIS — K219 Gastro-esophageal reflux disease without esophagitis: Secondary | ICD-10-CM | POA: Diagnosis present

## 2022-08-07 DIAGNOSIS — M797 Fibromyalgia: Secondary | ICD-10-CM | POA: Diagnosis present

## 2022-08-07 DIAGNOSIS — Z7984 Long term (current) use of oral hypoglycemic drugs: Secondary | ICD-10-CM

## 2022-08-07 DIAGNOSIS — Z886 Allergy status to analgesic agent status: Secondary | ICD-10-CM

## 2022-08-07 DIAGNOSIS — G894 Chronic pain syndrome: Secondary | ICD-10-CM | POA: Diagnosis present

## 2022-08-07 DIAGNOSIS — F329 Major depressive disorder, single episode, unspecified: Secondary | ICD-10-CM | POA: Diagnosis present

## 2022-08-07 DIAGNOSIS — E282 Polycystic ovarian syndrome: Secondary | ICD-10-CM | POA: Diagnosis present

## 2022-08-07 DIAGNOSIS — B961 Klebsiella pneumoniae [K. pneumoniae] as the cause of diseases classified elsewhere: Secondary | ICD-10-CM | POA: Diagnosis present

## 2022-08-07 DIAGNOSIS — Z79890 Hormone replacement therapy (postmenopausal): Secondary | ICD-10-CM

## 2022-08-07 DIAGNOSIS — R42 Dizziness and giddiness: Secondary | ICD-10-CM | POA: Diagnosis not present

## 2022-08-07 DIAGNOSIS — R55 Syncope and collapse: Secondary | ICD-10-CM | POA: Diagnosis present

## 2022-08-07 DIAGNOSIS — Z23 Encounter for immunization: Secondary | ICD-10-CM

## 2022-08-07 DIAGNOSIS — E875 Hyperkalemia: Secondary | ICD-10-CM | POA: Diagnosis present

## 2022-08-07 DIAGNOSIS — Z7985 Long-term (current) use of injectable non-insulin antidiabetic drugs: Secondary | ICD-10-CM

## 2022-08-07 DIAGNOSIS — Z825 Family history of asthma and other chronic lower respiratory diseases: Secondary | ICD-10-CM | POA: Diagnosis not present

## 2022-08-07 DIAGNOSIS — I959 Hypotension, unspecified: Secondary | ICD-10-CM | POA: Diagnosis present

## 2022-08-07 DIAGNOSIS — Z8249 Family history of ischemic heart disease and other diseases of the circulatory system: Secondary | ICD-10-CM

## 2022-08-07 DIAGNOSIS — E1159 Type 2 diabetes mellitus with other circulatory complications: Secondary | ICD-10-CM | POA: Diagnosis present

## 2022-08-07 DIAGNOSIS — Z811 Family history of alcohol abuse and dependence: Secondary | ICD-10-CM

## 2022-08-07 DIAGNOSIS — N179 Acute kidney failure, unspecified: Principal | ICD-10-CM | POA: Diagnosis present

## 2022-08-07 DIAGNOSIS — N390 Urinary tract infection, site not specified: Secondary | ICD-10-CM | POA: Diagnosis present

## 2022-08-07 DIAGNOSIS — Z882 Allergy status to sulfonamides status: Secondary | ICD-10-CM

## 2022-08-07 DIAGNOSIS — Z818 Family history of other mental and behavioral disorders: Secondary | ICD-10-CM

## 2022-08-07 DIAGNOSIS — Z79899 Other long term (current) drug therapy: Secondary | ICD-10-CM

## 2022-08-07 DIAGNOSIS — Z888 Allergy status to other drugs, medicaments and biological substances status: Secondary | ICD-10-CM

## 2022-08-07 DIAGNOSIS — J811 Chronic pulmonary edema: Secondary | ICD-10-CM | POA: Diagnosis not present

## 2022-08-07 DIAGNOSIS — Z6841 Body Mass Index (BMI) 40.0 and over, adult: Secondary | ICD-10-CM

## 2022-08-07 DIAGNOSIS — Z881 Allergy status to other antibiotic agents status: Secondary | ICD-10-CM

## 2022-08-07 DIAGNOSIS — Z91013 Allergy to seafood: Secondary | ICD-10-CM

## 2022-08-07 DIAGNOSIS — Z808 Family history of malignant neoplasm of other organs or systems: Secondary | ICD-10-CM

## 2022-08-07 DIAGNOSIS — Z792 Long term (current) use of antibiotics: Secondary | ICD-10-CM

## 2022-08-07 LAB — URINALYSIS, ROUTINE W REFLEX MICROSCOPIC
Bilirubin Urine: NEGATIVE
Glucose, UA: NEGATIVE mg/dL
Hgb urine dipstick: NEGATIVE
Ketones, ur: NEGATIVE mg/dL
Nitrite: POSITIVE — AB
Protein, ur: NEGATIVE mg/dL
Specific Gravity, Urine: 1.005 (ref 1.005–1.030)
pH: 5 (ref 5.0–8.0)

## 2022-08-07 LAB — COMPREHENSIVE METABOLIC PANEL
ALT: 40 U/L (ref 0–44)
AST: 25 U/L (ref 15–41)
Albumin: 3.5 g/dL (ref 3.5–5.0)
Alkaline Phosphatase: 74 U/L (ref 38–126)
Anion gap: 9 (ref 5–15)
BUN: 39 mg/dL — ABNORMAL HIGH (ref 6–20)
CO2: 18 mmol/L — ABNORMAL LOW (ref 22–32)
Calcium: 7.3 mg/dL — ABNORMAL LOW (ref 8.9–10.3)
Chloride: 108 mmol/L (ref 98–111)
Creatinine, Ser: 2.68 mg/dL — ABNORMAL HIGH (ref 0.44–1.00)
GFR, Estimated: 21 mL/min — ABNORMAL LOW (ref >60)
Glucose, Bld: 96 mg/dL (ref 70–99)
Potassium: 5.3 mmol/L — ABNORMAL HIGH (ref 3.5–5.1)
Sodium: 135 mmol/L (ref 135–145)
Total Bilirubin: 0.5 mg/dL (ref 0.3–1.2)
Total Protein: 6.2 g/dL — ABNORMAL LOW (ref 6.5–8.1)

## 2022-08-07 LAB — CBC WITH DIFFERENTIAL/PLATELET
Abs Immature Granulocytes: 0.02 10*3/uL (ref 0.00–0.07)
Basophils Absolute: 0.1 10*3/uL (ref 0.0–0.1)
Basophils Relative: 1 %
Eosinophils Absolute: 0.3 10*3/uL (ref 0.0–0.5)
Eosinophils Relative: 3 %
HCT: 35.7 % — ABNORMAL LOW (ref 36.0–46.0)
Hemoglobin: 11.4 g/dL — ABNORMAL LOW (ref 12.0–15.0)
Immature Granulocytes: 0 %
Lymphocytes Relative: 22 %
Lymphs Abs: 2.2 10*3/uL (ref 0.7–4.0)
MCH: 28.9 pg (ref 26.0–34.0)
MCHC: 31.9 g/dL (ref 30.0–36.0)
MCV: 90.6 fL (ref 80.0–100.0)
Monocytes Absolute: 0.7 10*3/uL (ref 0.1–1.0)
Monocytes Relative: 7 %
Neutro Abs: 6.4 10*3/uL (ref 1.7–7.7)
Neutrophils Relative %: 67 %
Platelets: 217 10*3/uL (ref 150–400)
RBC: 3.94 MIL/uL (ref 3.87–5.11)
RDW: 13.5 % (ref 11.5–15.5)
WBC: 9.6 10*3/uL (ref 4.0–10.5)
nRBC: 0 % (ref 0.0–0.2)

## 2022-08-07 LAB — GLUCOSE, CAPILLARY: Glucose-Capillary: 135 mg/dL — ABNORMAL HIGH (ref 70–99)

## 2022-08-07 LAB — PROTIME-INR
INR: 1.1 (ref 0.8–1.2)
Prothrombin Time: 13.7 seconds (ref 11.4–15.2)

## 2022-08-07 LAB — LIPASE, BLOOD: Lipase: 37 U/L (ref 11–51)

## 2022-08-07 LAB — TSH: TSH: 0.882 u[IU]/mL (ref 0.350–4.500)

## 2022-08-07 LAB — LACTIC ACID, PLASMA
Lactic Acid, Venous: 1.4 mmol/L (ref 0.5–1.9)
Lactic Acid, Venous: 1.7 mmol/L (ref 0.5–1.9)

## 2022-08-07 LAB — MAGNESIUM: Magnesium: 2.5 mg/dL — ABNORMAL HIGH (ref 1.7–2.4)

## 2022-08-07 MED ORDER — ONDANSETRON HCL 4 MG/2ML IJ SOLN: 4.0000 mg | Freq: Four times a day (QID) | INTRAMUSCULAR | Status: DC | PRN

## 2022-08-07 MED ORDER — MECLIZINE HCL 12.5 MG PO TABS
25.0000 mg | ORAL_TABLET | Freq: Three times a day (TID) | ORAL | Status: DC | PRN
  Administered 2022-08-07 – 2022-08-08 (×2): 25 mg via ORAL
  Filled 2022-08-07 (×2): qty 2

## 2022-08-07 MED ORDER — QUETIAPINE FUMARATE 100 MG PO TABS
100.0000 mg | ORAL_TABLET | Freq: Every day | ORAL | Status: DC
  Administered 2022-08-07 – 2022-08-08 (×2): 100 mg via ORAL
  Filled 2022-08-07 (×2): qty 1

## 2022-08-07 MED ORDER — MONTELUKAST SODIUM 10 MG PO TABS
10.0000 mg | ORAL_TABLET | Freq: Every day | ORAL | Status: DC
  Administered 2022-08-07 – 2022-08-08 (×2): 10 mg via ORAL
  Filled 2022-08-07 (×2): qty 1

## 2022-08-07 MED ORDER — FAMOTIDINE 20 MG PO TABS
20.0000 mg | ORAL_TABLET | Freq: Every day | ORAL | Status: DC
  Administered 2022-08-07 – 2022-08-08 (×2): 20 mg via ORAL
  Filled 2022-08-07 (×2): qty 1

## 2022-08-07 MED ORDER — CLONAZEPAM 0.5 MG PO TABS
0.5000 mg | ORAL_TABLET | Freq: Two times a day (BID) | ORAL | Status: DC | PRN
  Administered 2022-08-08 – 2022-08-09 (×2): 0.5 mg via ORAL
  Filled 2022-08-07 (×2): qty 1

## 2022-08-07 MED ORDER — ACETAMINOPHEN 650 MG RE SUPP: 650.0000 mg | Freq: Four times a day (QID) | RECTAL | Status: DC | PRN

## 2022-08-07 MED ORDER — SUMATRIPTAN SUCCINATE 6 MG/0.5ML ~~LOC~~ SOLN
6.0000 mg | Freq: Once | SUBCUTANEOUS | Status: AC
  Administered 2022-08-07: 6 mg via SUBCUTANEOUS
  Filled 2022-08-07 (×2): qty 0.5

## 2022-08-07 MED ORDER — CALCIUM CARBONATE ANTACID 500 MG PO CHEW
1.5000 | CHEWABLE_TABLET | Freq: Every day | ORAL | Status: DC
  Administered 2022-08-08 – 2022-08-09 (×2): 300 mg via ORAL
  Filled 2022-08-07 (×2): qty 2

## 2022-08-07 MED ORDER — VITAMIN B-12 1000 MCG PO TABS
2500.0000 ug | ORAL_TABLET | Freq: Every day | ORAL | Status: DC
  Administered 2022-08-08 – 2022-08-09 (×2): 2500 ug via ORAL
  Filled 2022-08-07: qty 3
  Filled 2022-08-07: qty 2.5
  Filled 2022-08-07: qty 3
  Filled 2022-08-07 (×3): qty 2.5

## 2022-08-07 MED ORDER — INSULIN ASPART 100 UNIT/ML IJ SOLN: 0.0000 [IU] | Freq: Every day | INTRAMUSCULAR | Status: DC

## 2022-08-07 MED ORDER — TOPIRAMATE 100 MG PO TABS
100.0000 mg | ORAL_TABLET | Freq: Every day | ORAL | Status: DC
  Administered 2022-08-07 – 2022-08-08 (×2): 100 mg via ORAL
  Filled 2022-08-07 (×2): qty 1

## 2022-08-07 MED ORDER — AMITRIPTYLINE HCL 25 MG PO TABS
75.0000 mg | ORAL_TABLET | Freq: Every day | ORAL | Status: DC
  Administered 2022-08-07 – 2022-08-08 (×2): 75 mg via ORAL
  Filled 2022-08-07 (×2): qty 3

## 2022-08-07 MED ORDER — ACETAMINOPHEN 325 MG PO TABS
650.0000 mg | ORAL_TABLET | Freq: Four times a day (QID) | ORAL | Status: DC | PRN
  Administered 2022-08-08 – 2022-08-09 (×2): 650 mg via ORAL
  Filled 2022-08-07 (×2): qty 2

## 2022-08-07 MED ORDER — RISAQUAD PO CAPS
1.0000 | ORAL_CAPSULE | Freq: Every day | ORAL | Status: DC
  Administered 2022-08-08 – 2022-08-09 (×2): 1 via ORAL
  Filled 2022-08-07 (×2): qty 1

## 2022-08-07 MED ORDER — PANTOPRAZOLE SODIUM 40 MG PO TBEC
40.0000 mg | DELAYED_RELEASE_TABLET | Freq: Every day | ORAL | Status: DC
  Administered 2022-08-08 – 2022-08-09 (×2): 40 mg via ORAL
  Filled 2022-08-07 (×2): qty 1

## 2022-08-07 MED ORDER — ONDANSETRON HCL 4 MG PO TABS: 4.0000 mg | ORAL_TABLET | Freq: Four times a day (QID) | ORAL | Status: DC | PRN

## 2022-08-07 MED ORDER — ALBUTEROL SULFATE (2.5 MG/3ML) 0.083% IN NEBU: 2.5000 mg | INHALATION_SOLUTION | RESPIRATORY_TRACT | Status: DC | PRN

## 2022-08-07 MED ORDER — MAGNESIUM OXIDE 400 MG PO TABS
1200.0000 mg | ORAL_TABLET | Freq: Two times a day (BID) | ORAL | Status: DC
  Administered 2022-08-08: 1200 mg via ORAL
  Filled 2022-08-07 (×6): qty 3

## 2022-08-07 MED ORDER — INSULIN ASPART 100 UNIT/ML IJ SOLN: 0.0000 [IU] | Freq: Three times a day (TID) | INTRAMUSCULAR | Status: DC

## 2022-08-07 MED ORDER — SODIUM CHLORIDE 0.9 % IV SOLN
2.0000 g | Freq: Once | INTRAVENOUS | Status: AC
  Administered 2022-08-07: 2 g via INTRAVENOUS
  Filled 2022-08-07: qty 20

## 2022-08-07 MED ORDER — LEVOTHYROXINE SODIUM 75 MCG PO TABS
175.0000 ug | ORAL_TABLET | Freq: Every day | ORAL | Status: DC
  Administered 2022-08-08 – 2022-08-09 (×2): 175 ug via ORAL
  Filled 2022-08-07 (×2): qty 1

## 2022-08-07 MED ORDER — HEPARIN SODIUM (PORCINE) 5000 UNIT/ML IJ SOLN
5000.0000 [IU] | Freq: Three times a day (TID) | INTRAMUSCULAR | Status: DC
  Administered 2022-08-07 – 2022-08-09 (×5): 5000 [IU] via SUBCUTANEOUS
  Filled 2022-08-07 (×5): qty 1

## 2022-08-07 MED ORDER — SERTRALINE HCL 50 MG PO TABS
150.0000 mg | ORAL_TABLET | Freq: Every day | ORAL | Status: DC
  Administered 2022-08-08 – 2022-08-09 (×2): 150 mg via ORAL
  Filled 2022-08-07 (×2): qty 3

## 2022-08-07 MED ORDER — ROSUVASTATIN CALCIUM 20 MG PO TABS
40.0000 mg | ORAL_TABLET | Freq: Every day | ORAL | Status: DC
  Administered 2022-08-08 – 2022-08-09 (×2): 40 mg via ORAL
  Filled 2022-08-07 (×2): qty 2

## 2022-08-07 MED ORDER — PREGABALIN 75 MG PO CAPS
225.0000 mg | ORAL_CAPSULE | Freq: Two times a day (BID) | ORAL | Status: DC
  Administered 2022-08-07 – 2022-08-09 (×4): 225 mg via ORAL
  Filled 2022-08-07 (×4): qty 3

## 2022-08-07 MED ORDER — SODIUM CHLORIDE 0.9 % IV SOLN: INTRAVENOUS | Status: AC

## 2022-08-07 MED ORDER — SODIUM CHLORIDE 0.9 % IV BOLUS
1000.0000 mL | Freq: Once | INTRAVENOUS | Status: AC
  Administered 2022-08-07: 1000 mL via INTRAVENOUS

## 2022-08-07 MED ORDER — FLUTICASONE FUROATE-VILANTEROL 200-25 MCG/ACT IN AEPB
1.0000 | INHALATION_SPRAY | Freq: Every day | RESPIRATORY_TRACT | Status: DC
  Administered 2022-08-08 – 2022-08-09 (×2): 1 via RESPIRATORY_TRACT
  Filled 2022-08-07 (×2): qty 28

## 2022-08-07 MED ORDER — SODIUM CHLORIDE 0.9 % IV SOLN
2.0000 g | INTRAVENOUS | Status: DC
  Administered 2022-08-08: 2 g via INTRAVENOUS
  Filled 2022-08-07 (×2): qty 20

## 2022-08-07 MED ORDER — SODIUM CHLORIDE 0.9 % IV BOLUS (SEPSIS)
1000.0000 mL | Freq: Once | INTRAVENOUS | Status: AC
  Administered 2022-08-07: 1000 mL via INTRAVENOUS

## 2022-08-07 NOTE — ED Provider Notes (Signed)
[facility] EMERGENCY DEPARTMENT Provider Note   CSN: [id number] Arrival date & time: 08/07/22  1402     History  Chief Complaint  Patient presents with   Hypotension    JANAE Lewis is a 51 y.o. female.  HPI   This patient is a 51 year old female, she is morbidly obese, she has a history of diabetes, hypertension, high cholesterol, asthma, she is on medications for all of these things.  She reports that today just after having a argument with her significant other she started to have a feeling like her vision was going blurry like she could see bright lights, sparkling lights like she was looking through a filter, she then became lightheaded, she tried to pick up her cat and felt like she might pass out so she went to sit down.  She has not had any abdominal pain but does note an episode of dysuria and a feeling that she needs to urinate despite nothing coming out.  She had 1 episode of diarrhea the other night.  She feels like she is very dry in the mouth and very dizzy and lightheaded at this time.  She denies focal weakness, focal numbness and has not had any fevers to the best of her knowledge.  Home Medications Prior to Admission medications   Medication Sig Start Date End Date Taking? Authorizing Provider  acetaminophen (TYLENOL) 650 MG CR tablet Take 650-1,300 mg by mouth every 8 (eight) hours as needed for pain.   Yes [provider]  acidophilus (RISAQUAD) CAPS capsule Take 1 capsule by mouth daily.   Yes [provider]  albuterol (PROVENTIL HFA;VENTOLIN HFA) 108 (90 BASE) MCG/ACT inhaler Inhale 2 puffs into the lungs every 6 (six) hours as needed for wheezing or shortness of breath.   Yes [provider]  amitriptyline (ELAVIL) 75 MG tablet Take 75 mg by mouth at bedtime.   Yes [provider]  Ascorbic Acid (VITAMIN C) 1000 MG tablet Take 1,000 mg by mouth daily.    Yes [provider]  Biotin 10 MG CAPS Take 10 mg by mouth daily.     Yes [provider]  butalbital-acetaminophen-caffeine (FIORICET) 50-325-40 MG tablet Take 1 tablet by mouth every 6 (six) hours as needed for migraine.  08/24/19  Yes [provider]  calcium carbonate (TUMS EX) 750 MG chewable tablet Chew 1 tablet by mouth daily.    Yes [provider]  clonazePAM (KLONOPIN) 0.5 MG tablet Take 0.5 mg by mouth 2 (two) times daily as needed for anxiety. 06/24/22  Yes [provider]  Cyanocobalamin (B-12) 2500 MCG TABS Take 2,500 mcg by mouth daily.   Yes [provider]  diclofenac sodium (VOLTAREN) 1 % GEL Apply 2 g topically 4 (four) times daily. Patient taking differently: Apply 2 g topically 3 (three) times daily as needed (pain). 05/29/19  Yes Noemi Chapel, MD  diphenhydrAMINE (BENADRYL) 25 MG tablet Take 25 mg by mouth every 8 (eight) hours as needed for itching or allergies.    Yes [provider]  EPINEPHrine 0.3 mg/0.3 mL IJ SOAJ injection Inject 0.3 mg into the muscle as needed for anaphylaxis.   Yes [provider]  ergocalciferol (VITAMIN D2) 1.25 MG (50000 UT) capsule Take 50,000 Units by mouth 2 (two) times a week.  10/25/19  Yes [provider]  famotidine (PEPCID) 20 MG tablet Take 20 mg by mouth at bedtime.   Yes [provider]  fluticasone furoate-vilanterol (BREO ELLIPTA) 200-25 MCG/INH  AEPB Inhale 1 puff into the lungs daily.   Yes [provider]  furosemide (LASIX) 20 MG tablet Take 20 mg by mouth daily as needed for fluid. 07/16/20  Yes [provider]  Galcanezumab-gnlm (EMGALITY) 120 MG/ML SOAJ Inject 120 mg into the skin every 30 (thirty) days.   Yes [provider]  levocetirizine (XYZAL) 5 MG tablet Take 5 mg by mouth at bedtime.  09/12/19  Yes [provider]  levothyroxine (SYNTHROID) 175 MCG tablet Take 175 mcg by mouth daily before breakfast. 07/14/22  Yes [provider]  lisinopril (ZESTRIL) 5 MG tablet Take  5 mg by mouth at bedtime.  12/11/18  Yes [provider]  magnesium oxide (MAG-OX) 400 MG tablet Take 1,200 mg by mouth 2 (two) times daily. Take 1200 mg by mouth in the morning and 1200 mg at bedtime 10/24/19  Yes [provider]  meclizine (ANTIVERT) 25 MG tablet Take 25 mg by mouth 3 (three) times daily as needed for dizziness.  09/19/19  Yes [provider]  metFORMIN (GLUCOPHAGE-XR) 500 MG 24 hr tablet Take 1,000 mg by mouth 2 (two) times daily.   Yes [provider]  metoprolol tartrate (LOPRESSOR) 100 MG tablet Take 100 mg by mouth 2 (two) times daily.   Yes [provider]  montelukast (SINGULAIR) 10 MG tablet Take 10 mg by mouth at bedtime.    Yes [provider]  Multiple Vitamin (TAB-A-VITE) TABS Take 1 tablet by mouth daily. 07/15/20  Yes [provider]  naphazoline-pheniramine (NAPHCON-A) 0.025-0.3 % ophthalmic solution Place 1 drop into both eyes 4 (four) times daily as needed for eye irritation.   Yes [provider]  naproxen sodium (ALEVE) 220 MG tablet Take 220 mg by mouth as needed (pain).   Yes [provider]  nitrofurantoin, macrocrystal-monohydrate, (MACROBID) 100 MG capsule Take 1 capsule (100 mg total) by mouth daily. 01/04/22  Yes MacDiarmid, Nicki Reaper, MD  omeprazole (PRILOSEC) 40 MG capsule Take 40 mg by mouth in the morning and at bedtime.   Yes [provider]  oxyCODONE (OXY IR/ROXICODONE) 5 MG immediate release tablet Take 1 tablet (5 mg total) by mouth every 8 (eight) hours as needed for severe pain. Must last 30 days 07/27/22 08/26/22 Yes Milinda Pointer, MD  OZEMPIC, 0.25 OR 0.5 MG/DOSE, 2 MG/1.5ML SOPN Inject 1 mg into the skin once a week. 07/17/20  Yes [provider]  pregabalin (LYRICA) 225 MG capsule Take 1 capsule (225 mg total) by mouth 2 (two) times daily. 10/02/20  Yes Milinda Pointer, MD  QUEtiapine (SEROQUEL) 300 MG tablet Take 150 mg by mouth at bedtime.   Yes  [provider]  rizatriptan (MAXALT) 10 MG tablet Take 1 and may repeat in 2 hours for MIgraines, alternate with the disintrgrating tablet. 09/04/20  Yes [provider]  rizatriptan (MAXALT-MLT) 10 MG disintegrating tablet Take 10 mg by mouth. Take 1 and may repeat in 2 hours  for Migraines 08/16/20  Yes [provider]  rosuvastatin (CRESTOR) 40 MG tablet Take 40 mg by mouth daily.  02/28/19  Yes [provider]  sertraline (ZOLOFT) 100 MG tablet Take 150 mg by mouth daily. 07/07/22  Yes [provider]  spironolactone (ALDACTONE) 25 MG tablet Take 37.5 mg by mouth 2 (two) times daily.    Yes [provider]  tiZANidine (ZANAFLEX) 4 MG tablet Take 1 tablet (4 mg total) by mouth 2 (two) times daily as needed for muscle spasms. 11/30/19  Yes Duanne Guess, PA-C  topiramate (TOPAMAX) 50 MG tablet Take 100 mg by mouth at bedtime.   Yes [provider]  ACCU-CHEK AVIVA PLUS test strip  03/13/19   [provider]  glucose blood (ACCU-CHEK AVIVA PLUS) test strip Use 2 (two) times daily 09/29/18   [provider]      Allergies    Bactrim [sulfamethoxazole-trimethoprim], Omalizumab, Ciprofloxacin, Shellfish allergy, and Nsaids    Review of Systems   Review of Systems  All other systems reviewed and are negative.   Physical Exam Updated Vital Signs BP (!) 107/57   Pulse 94   Temp 98.2 F (36.8 C) (Oral)   Resp 16   Ht 1.6 m ('5\' 3"'$ )   Wt (!) 165.6 kg   SpO2 100%   BMI 64.66 kg/m  Physical Exam Vitals and nursing note reviewed.  Constitutional:      General: She is not in acute distress.    Appearance: She is well-developed.  HENT:     Head: Normocephalic and atraumatic.     Mouth/Throat:     Mouth: Mucous membranes are dry.     Pharynx: No oropharyngeal exudate.  Eyes:     General: No scleral icterus.       Right eye: No discharge.        Left eye: No discharge.     Pupils: Pupils are equal, round,  and reactive to light.     Comments: Conjunctive a and mucous membranes are slightly pale  Neck:     Thyroid: No thyromegaly.     Vascular: No JVD.  Cardiovascular:     Rate and Rhythm: Regular rhythm. Tachycardia present.     Heart sounds: Normal heart sounds. No murmur heard.    No friction rub. No gallop.     Comments: Heart rate of 110 Pulmonary:     Effort: Pulmonary effort is normal. No respiratory distress.     Breath sounds: Normal breath sounds. No wheezing or rales.  Abdominal:     General: Bowel sounds are normal. There is no distension.     Palpations: Abdomen is soft. There is no mass.     Tenderness: There is no abdominal tenderness.     Comments: Minimal mid abdominal tenderness but no guarding, normal bowel sounds  Musculoskeletal:        General: No tenderness. Normal range of motion.     Cervical back: Normal range of motion and neck supple.     Right lower leg: Edema present.     Left lower leg: Edema present.  Lymphadenopathy:     Cervical: No cervical adenopathy.  Skin:    General: Skin is warm and dry.     Findings: No erythema or rash.  Neurological:     Mental Status: She is alert.     Coordination: Coordination normal.     Comments: The patient is awake, she is able to answer my questions but appears mildly tired.  She moves all 4 extremities with normal coordination and strength.  She does have weak peripheral artery pulses  Psychiatric:        Behavior: Behavior normal.     ED Results / Procedures / Treatments   Labs (all labs ordered are listed, but only abnormal results are displayed) Labs Reviewed  COMPREHENSIVE METABOLIC PANEL - Abnormal; Notable for the following components:      Result Value   Potassium 5.3 (*)    CO2 18 (*)    BUN  39 (*)    Creatinine, Ser 2.68 (*)    Calcium 7.3 (*)    Total Protein 6.2 (*)    GFR, Estimated 21 (*)    All other components within normal limits  CBC WITH DIFFERENTIAL/PLATELET - Abnormal; Notable for  the following components:   Hemoglobin 11.4 (*)    HCT 35.7 (*)    All other components within normal limits  MAGNESIUM - Abnormal; Notable for the following components:   Magnesium 2.5 (*)    All other components within normal limits  CULTURE, BLOOD (ROUTINE X 2)  CULTURE, BLOOD (ROUTINE X 2)  URINE CULTURE  LACTIC ACID, PLASMA  PROTIME-INR  LIPASE, BLOOD  TSH  LACTIC ACID, PLASMA  URINALYSIS, ROUTINE W REFLEX MICROSCOPIC    EKG EKG Interpretation  Date/Time:  Saturday August 07 2022 14:47:23 EST Ventricular Rate:  79 PR Interval:  210 QRS Duration: 88 QT Interval:  388 QTC Calculation: 445 R Axis:   1 Text Interpretation: Sinus rhythm Prolonged PR interval Low voltage, precordial leads Confirmed by Noemi Chapel [phone]) on 08/07/2022 3:04:55 PM  Radiology DG Chest Port 1 View  Result Date: 08/07/2022 CLINICAL DATA:  Hypotension and questionable sepsis; evaluate for abnormality EXAM: PORTABLE CHEST 1 VIEW COMPARISON:  Radiographs 11/30/2019 FINDINGS: Stable cardiomegaly. Mild pulmonary vascular congestion. Question atelectasis versus infiltrates in the left costophrenic angle. No pneumothorax. No acute osseous abnormality. IMPRESSION: Question atelectasis versus infiltrates in the left lower lung. Cardiomegaly and pulmonary vascular congestion. Electronically Signed   By: Placido Sou M.D.   On: 08/07/2022 15:01    Procedures .Critical Care  Performed by: Noemi Chapel, MD Authorized by: Noemi Chapel, MD   Critical care provider statement:    Critical care time (minutes):  45   Critical care time was exclusive of:  Separately billable procedures and treating other patients and teaching time   Critical care was necessary to treat or prevent imminent or life-threatening deterioration of the following conditions:  Renal failure   Critical care was time spent personally by me on the following activities:  Development of treatment plan with patient or surrogate,  discussions with consultants, evaluation of patient's response to treatment, examination of patient, ordering and review of laboratory studies, ordering and review of radiographic studies, ordering and performing treatments and interventions, pulse oximetry, re-evaluation of patient's condition and review of old charts   I assumed direction of critical care for this patient from another provider in my specialty: no     Care discussed with: admitting provider   Comments:           Medications Ordered in ED Medications  sodium chloride 0.9 % bolus 1,000 mL (has no administration in time range)  cefTRIAXone (ROCEPHIN) 2 g in sodium chloride 0.9 % 100 mL IVPB (has no administration in time range)  sodium chloride 0.9 % bolus 1,000 mL (0 mLs Intravenous Stopped 08/07/22 1500)    ED Course/ Medical Decision Making/ A&P                           Medical Decision Making Amount and/or Complexity of Data Reviewed Labs: ordered. Radiology: ordered. ECG/medicine tests: ordered.  Risk Decision regarding hospitalization.   This patient presents to the ED for concern of dizziness and hypotension, this involves an extensive number of treatment options, and is a complaint that carries with it a high risk of complications and morbidity.  The differential diagnosis includes sepsis, dehydration, medication reaction, anxiety  reaction   Co morbidities that complicate the patient evaluation  Morbid obesity, she does appear dehydrated, evidently the patient was hypotensive on arrival and though that is improving she is still slightly hypotensive.  She has not had much in the way of fluids today but has been eating and drinking.  She also has a feeling of urinary hesitancy suggesting possible urinary infection   Additional history obtained:  Additional history obtained from electronic medical record External records from outside source obtained and reviewed including office visits for her diabetes  last seen about 3 days ago.  She has chronic pain, she has major depressive disorder, some anxiety, history of cystitis as recently as 2 weeks ago, she is followed at the [place] [facility].  Her last urinary culture performed on November 22 of last year showed E. coli.  She had a urinary test performed on October 23 1 month ago which showed obvious UTI, repeat urinalysis on 6 November was essentially clean.   Lab Tests:  I Ordered, and personally interpreted labs.  The pertinent results include: Metabolic panel shows that the patient has an acute kidney injury with a creatinine of over 2.6 with a baseline that is around 1.  She has hyperkalemia at 5.4, no signs of hyperkalemia on EKG.  This is likely secondary to her creatinine elevation.  She has been given 2 L of IV fluids.  CBC shows no leukocytosis   Imaging Studies ordered:  I ordered imaging studies including chest x-ray I independently visualized and interpreted imaging which showed no acute pneumothorax, atelectasis versus infiltrate, the patient is not coughing or short of breath or hypoxic I agree with the radiologist interpretation   Cardiac Monitoring: / EKG:  The patient was maintained on a cardiac monitor.  I personally viewed and interpreted the cardiac monitored which showed an underlying rhythm of: Sinus tachycardia, gradually improving   Consultations Obtained:  I requested consultation with the hospitalist, Dr. Waldron Labs,  and discussed lab and imaging findings as well as pertinent plan - they recommend: Admission to the hospital   Problem List / ED Course / Critical interventions / Medication management  Ultimately the patient went to give a urine sample but dropped the cup in the toilet.  We were unable to get a sample initially, antibiotics will be added to cover for UTI given the acute kidney injury and the symptoms urinary symptoms that led up to this.  Her hypotension is improving with IV fluid boluses but she will  likely need to be admitted to the hospital to make sure she continues to stabilize and treat the kidney failure.  She is not having any retention Multiple IV fluid boluses for hypotension decreased urinary output I ordered medication including Rocephin for urinary tract infection, IV fluids for dehydration and hypotension Reevaluation of the patient after these medicines showed that the patient improved I have reviewed the patients home medicines and have made adjustments as needed   Social Determinants of Health:  Morbidly obese Acute kidney injury   Test / Admission - Considered:  We will admit to the hospital         Final Clinical Impression(s) / ED Diagnoses Final diagnoses:  Acute renal failure, unspecified acute renal failure type ([place])  Hyperkalemia     Noemi Chapel, MD 08/07/22 1728

## 2022-08-07 NOTE — ED Provider Notes (Incomplete)
[facility] EMERGENCY DEPARTMENT Provider Note   CSN: [id number] Arrival date & time: 08/07/22  1402     History {Add pertinent medical, surgical, social history, OB history to HPI:1} Chief Complaint  Patient presents with   Hypotension    Karen Lewis is a 51 y.o. female.  HPI Patient presents for near syncopal symptoms.  Medical history includes obesity, hypothyroidism, anemia, anxiety, arthritis, asthma, DM, depression, GERD.  She is prescribed blood pressure medications.  At around 1030 or 11:00, she did take her morning metoprolol and spironolactone.  Lisinopril was only taken at night.  She states that she has had increased fatigue lately.  Her partner's had come in to town for Thanksgiving and she has been more active than normal.  She did experience fatigue this morning.  At around midday, she began to feel lightheaded.  She checked her blood sugar and it was 180.  She laid down and had worsening lightheadedness and dizziness.  At that point, she checked her blood pressure and found it to be SBP of 66.  She checked the other arm and it was SBP of 77.  At that point, EMS was called.    EMS states that they got their first reading of SBP in the 40s.  Despite this, patient was awake and talking to them.  She did receive 900 cc IVF prior to arrival.  Subsequent blood pressure check shortly prior to arrival showed SBP in the 90s.  Currently, patient endorses headache and ongoing fatigue.  She does state that she has had recent dysuria.  She had 1 episode of diarrhea last night but has not had any other recent fluid losses.  She has had recent shortness of breath but denies any recent chest pains.    Home Medications Prior to Admission medications   Medication Sig Start Date End Date Taking? Authorizing Provider  ACCU-CHEK AVIVA PLUS test strip  03/13/19   [provider]  acetaminophen (TYLENOL) 650 MG CR tablet Take 650-1,300 mg by mouth every 8 (eight) hours as needed for pain.     [provider]  acidophilus (RISAQUAD) CAPS capsule Take 1 capsule by mouth daily.    [provider]  albuterol (PROVENTIL HFA;VENTOLIN HFA) 108 (90 BASE) MCG/ACT inhaler Inhale 2 puffs into the lungs every 6 (six) hours as needed for wheezing or shortness of breath.    [provider]  amitriptyline (ELAVIL) 100 MG tablet Take 75 mg by mouth at bedtime.    [provider]  Ascorbic Acid (VITAMIN C) 1000 MG tablet Take 1,000 mg by mouth daily.     [provider]  Biotin 10 MG CAPS Take 10 mg by mouth daily.     [provider]  butalbital-acetaminophen-caffeine (FIORICET) 50-325-40 MG tablet Take 1 tablet by mouth every 6 (six) hours as needed for migraine.  08/24/19   [provider]  calcium carbonate (TUMS EX) 750 MG chewable tablet Chew 1 tablet by mouth daily.     [provider]  Cyanocobalamin (B-12) 2500 MCG TABS Take 2,500 mcg by mouth daily.    [provider]  diclofenac sodium (VOLTAREN) 1 % GEL Apply 2 g topically 4 (four) times daily. Patient taking differently: Apply 2 g topically 3 (three) times daily as needed (pain). 05/29/19   Noemi Chapel, MD  diphenhydrAMINE (BENADRYL) 25 MG tablet Take 25 mg by mouth every 8 (eight) hours as needed for itching or allergies.     [provider]  EPINEPHrine 0.3 mg/0.3 mL IJ SOAJ injection Inject 0.3 mg into the muscle as needed for anaphylaxis.    [provider]  ergocalciferol (VITAMIN D2) 1.25 MG (50000 UT) capsule Take 50,000 Units by mouth 2 (two) times a week.  10/25/19   [provider]  famotidine (PEPCID) 20 MG tablet Take 20 mg by mouth at bedtime.    [provider]  fluticasone furoate-vilanterol (BREO ELLIPTA) 200-25 MCG/INH AEPB Inhale 1 puff into the lungs daily.    [provider]  furosemide (LASIX) 20 MG tablet Take 20 mg by mouth daily as needed. 07/16/20   [provider]  Galcanezumab-gnlm  (EMGALITY) 120 MG/ML SOAJ Inject 120 mg into the skin every 30 (thirty) days.    [provider]  glucose blood (ACCU-CHEK AVIVA PLUS) test strip Use 2 (two) times daily 09/29/18   [provider]  levocetirizine (XYZAL) 5 MG tablet Take 5 mg by mouth at bedtime.  09/12/19   [provider]  lisinopril (ZESTRIL) 5 MG tablet Take 5 mg by mouth at bedtime.  12/11/18 05/26/22  [provider]  magnesium oxide (MAG-OX) 400 MG tablet Take 1,200 mg by mouth 2 (two) times daily. Take 1200 mg by mouth in the morning and 1200 mg at bedtime 10/24/19   [provider]  meclizine (ANTIVERT) 25 MG tablet Take 25 mg by mouth 3 (three) times daily as needed for dizziness.  09/19/19   [provider]  metoprolol tartrate (LOPRESSOR) 50 MG tablet Take 100 mg by mouth 2 (two) times daily. 03/16/19   [provider]  montelukast (SINGULAIR) 10 MG tablet Take 10 mg by mouth at bedtime.     [provider]  Multiple Vitamin (TAB-A-VITE) TABS  07/15/20   [provider]  naphazoline-pheniramine (NAPHCON-A) 0.025-0.3 % ophthalmic solution Place 1 drop into both eyes 4 (four) times daily as needed for eye irritation.    [provider]  naproxen sodium (ALEVE) 220 MG tablet Take 220 mg by mouth as needed.    [provider]  nitrofurantoin, macrocrystal-monohydrate, (MACROBID) 100 MG capsule Take 1 capsule (100 mg total) by mouth daily. 01/04/22   Bjorn Loser, MD  omeprazole (PRILOSEC) 40 MG capsule Take 40 mg by mouth in the morning and at bedtime.    [provider]  oxyCODONE (OXY IR/ROXICODONE) 5 MG immediate release tablet Take 1 tablet (5 mg total) by mouth every 8 (eight) hours as needed for severe pain. Must last 30 days 06/27/22 07/27/22  Milinda Pointer, MD  oxyCODONE (OXY IR/ROXICODONE) 5 MG immediate release tablet Take 1 tablet (5 mg total) by mouth every 8 (eight) hours as needed for severe pain. Must last  30 days 07/27/22 08/26/22  Milinda Pointer, MD  OZEMPIC, 0.25 OR 0.5 MG/DOSE, 2 MG/1.5ML SOPN Inject 1 mg into the skin once a week. 07/17/20   [provider]  pregabalin (LYRICA) 225 MG capsule Take 1 capsule (225 mg total) by mouth 2 (two) times daily. 10/02/20 05/26/22  Milinda Pointer, MD  QUEtiapine (SEROQUEL) 300 MG tablet Take 300 mg by mouth at bedtime.    [provider]  rizatriptan (MAXALT) 10 MG tablet Take 1 and may repeat in 2 hours for MIgraines, alternate with the disintrgrating tablet. 09/04/20   [provider]  rizatriptan (MAXALT-MLT) 10 MG disintegrating tablet Take 10 mg by mouth. Take 1 and may repeat in 2 hours  for Migraines 08/16/20   [provider]  rosuvastatin (CRESTOR) 40 MG  tablet Take 40 mg by mouth daily.  02/28/19   [provider]  spironolactone (ALDACTONE) 25 MG tablet Take 37.5 mg by mouth 2 (two) times daily.     [provider]  tiZANidine (ZANAFLEX) 4 MG tablet Take 1 tablet (4 mg total) by mouth 2 (two) times daily as needed for muscle spasms. 11/30/19   Duanne Guess, PA-C  topiramate (TOPAMAX) 50 MG tablet Take 100 mg by mouth at bedtime.    [provider]      Allergies    Bactrim [sulfamethoxazole-trimethoprim], Omalizumab, Ciprofloxacin, Shellfish allergy, and Nsaids    Review of Systems   Review of Systems  Constitutional:  Positive for fatigue.  Respiratory:  Positive for shortness of breath.   Gastrointestinal:  Positive for diarrhea.  Genitourinary:  Positive for dysuria.  Neurological:  Positive for dizziness, weakness (Generalized), light-headedness and headaches.  All other systems reviewed and are negative.   Physical Exam Updated Vital Signs Ht '5\' 3"'$  (1.6 m)   Wt (!) 165.6 kg   BMI 64.66 kg/m  Physical Exam Vitals and nursing note reviewed.  Constitutional:      General: She is not in acute distress.    Appearance: She is well-developed. She is obese. She is  not toxic-appearing or diaphoretic.  HENT:     Head: Normocephalic and atraumatic.     Right Ear: External ear normal.     Left Ear: External ear normal.     Nose: Nose normal.     Mouth/Throat:     Mouth: Mucous membranes are moist.     Pharynx: Oropharynx is clear.  Eyes:     Extraocular Movements: Extraocular movements intact.     Conjunctiva/sclera: Conjunctivae normal.  Cardiovascular:     Rate and Rhythm: Normal rate and regular rhythm.     Heart sounds: No murmur heard. Pulmonary:     Effort: Pulmonary effort is normal. No respiratory distress.     Breath sounds: No wheezing or rales.  Chest:     Chest wall: No tenderness.  Abdominal:     Palpations: Abdomen is soft.     Tenderness: There is abdominal tenderness. There is no guarding or rebound.     Hernia: A hernia is present.  Musculoskeletal:        General: No swelling or deformity. Normal range of motion.     Cervical back: Normal range of motion and neck supple.     Right lower leg: No edema.     Left lower leg: No edema.  Skin:    General: Skin is warm and dry.     Coloration: Skin is not jaundiced or pale.  Neurological:     General: No focal deficit present.     Mental Status: She is alert and oriented to person, place, and time.     Cranial Nerves: No cranial nerve deficit.     Sensory: No sensory deficit.     Motor: No weakness.     Coordination: Coordination normal.  Psychiatric:        Mood and Affect: Mood normal.        Behavior: Behavior normal.        Thought Content: Thought content normal.        Judgment: Judgment normal.     ED Results / Procedures / Treatments   Labs (all labs ordered are listed, but only abnormal results are displayed) Labs Reviewed - No data to display  EKG None  Radiology No results  found.  Procedures Procedures  {Document cardiac monitor, telemetry assessment procedure when appropriate:1}  Medications Ordered in ED Medications - No data to display  ED  Course/ Medical Decision Making/ A&P                           Medical Decision Making  This patient presents to the ED for concern of ***, this involves an extensive number of treatment options, and is a complaint that carries with it a high risk of complications and morbidity.  The differential diagnosis includes ***   Co morbidities that complicate the patient evaluation  ***   Additional history obtained:  Additional history obtained from *** External records from outside source obtained and reviewed including ***   Lab Tests:  I Ordered, and personally interpreted labs.  The pertinent results include:  ***   Imaging Studies ordered:  I ordered imaging studies including ***  I independently visualized and interpreted imaging which showed *** I agree with the radiologist interpretation   Cardiac Monitoring: / EKG:  The patient was maintained on a cardiac monitor.  I personally viewed and interpreted the cardiac monitored which showed an underlying rhythm of: ***   Consultations Obtained:  I requested consultation with the ***,  and discussed lab and imaging findings as well as pertinent plan - they recommend: ***   Problem List / ED Course / Critical interventions / Medication management  *** I ordered medication including ***  for ***  Reevaluation of the patient after these medicines showed that the patient {resolved/improved/worsened:23923::"improved"} I have reviewed the patients home medicines and have made adjustments as needed   Social Determinants of Health:  ***   Test / Admission - Considered:  ***    {Document critical care time when appropriate:1} {Document review of labs and clinical decision tools ie heart score, Chads2Vasc2 etc:1}  {Document your independent review of radiology images, and any outside records:1} {Document your discussion with family members, caretakers, and with consultants:1} {Document social determinants of health  affecting pt's care:1} {Document your decision making why or why not admission, treatments were needed:1} Final Clinical Impression(s) / ED Diagnoses Final diagnoses:  None    Rx / DC Orders ED Discharge Orders     None

## 2022-08-07 NOTE — H&P (Signed)
TRH H&P   Patient Demographics:    Karen Lewis, is a 51 y.o. female  MRN: [id number]   DOB - December 11, 1970  Admit Date - 08/07/2022  Outpatient Primary MD for the patient is Idelle Crouch, MD  Referring MD/NP/PA: Dr Sabra Heck  Patient coming from: home  Chief Complaint  Patient presents with   Hypotension      HPI:    Karen Lewis  is a 51 y.o. female, with past medical history of morbid obesity, diabetes mellitus, hypertension, hyperlipidemia, asthma, fibromyalgia, neuropathy, status post gastric sleeve/gastric bypass, patient presents to ED secondary to presyncope, she reports she has an argument with her significant other, started to feel like her vision is going blurry, and she could see bright light, feeling lightheaded, dizzy, felt she might pass out, so she went to sit down, she does report dysuria for the last 24 hours, increased urinary urgency, she reports 1 episode of diarrhea last night, report dry mouth currently, she denies any chest pain or shortness of breath, no loss of consciousness, no focal numbness, or focal deficits -In ED she was noted to have low blood pressure 89/66, 91/52, this has responded nicely to IV fluids, her workup significant for worsening creatinine at 2.68, from baseline of 1, as well potassium of 5.3, she had positive UA, so [facility] hospitalist consulted to admit.    Review of systems:   A full 10 point Review of Systems was done, except as stated above, all other Review of Systems were negative.   With Past History of the following :    Past Medical History:  Diagnosis Date   Anemia    Anginal pain ([place])    Anxiety    Arthralgia of hip 07/29/2015   Arthritis    Arthritis, degenerative 07/29/2015   Asthma    Cephalalgia 07/25/2014   Dependence on unknown drug ([place])    multiplt controlled drug dependence   Depression    Diabetes mellitus  without complication ([place])    Difficult intubation    per pt needs small tube (has had abrasions from tube in past)   Dysrhythmia    PVC's   Eczema    Fibromyalgia    Gastritis    GERD (gastroesophageal reflux disease)    Gonalgia 07/29/2015   Overview:  Overview:  The patient has had bilateral intra-articular Hyalgan injections done on 07/16/2014 and although she seems to do well with this type of therapy, apparently her insurance company does not want to pay for they Hyalgan. On 11/27/2014 the patient underwent a bilateral genicular nerve block with excellent results. On 01/28/2015 she had a right knee genicular radiofrequency ablatio   Gout    H/O cardiovascular disorder 03/10/2015   H/O surgical procedure 12/05/2012   Overview:  LSG ([facility] - April 2013)     H/O thyroid disease 03/10/2015   Headache    Herpes  History of artificial joint 07/29/2015   History of hiatal hernia    Hypertension    Hypomagnesemia    Hypothyroidism    IDA (iron deficiency anemia) 05/28/2019   LBP (low back pain) 07/29/2015   Neuromuscular disorder ([facility])    Obesity    PCOS (polycystic ovarian syndrome)    Primary osteoarthritis of both knees 07/29/2015   Sleep apnea    not using a Cpap machine at this time - most recent test mild apnea does not qualify for test (not OSA)   Thyroid nodule bilateral   Umbilical hernia       Past Surgical History:  Procedure Laterality Date   APPENDECTOMY     CARPAL TUNNEL RELEASE Bilateral    CHOLECYSTECTOMY     COLONOSCOPY WITH PROPOFOL N/A 02/11/2021   Procedure: COLONOSCOPY WITH PROPOFOL;  Surgeon: Toledo, Benay Pike, MD;  Location: [facility];  Service: Gastroenterology;  Laterality: N/A;   DIAGNOSTIC LAPAROSCOPY     ESOPHAGOGASTRODUODENOSCOPY (EGD) WITH PROPOFOL N/A 02/11/2021   Procedure: ESOPHAGOGASTRODUODENOSCOPY (EGD) WITH PROPOFOL;  Surgeon: Toledo, Benay Pike, MD;  Location: [facility];  Service: Gastroenterology;  Laterality: N/A;   HIATAL HERNIA  REPAIR     JOINT REPLACEMENT Bilateral hip   LAPAROSCOPIC PARTIAL GASTRECTOMY     left trigger finger     peniculectomy N/A 07/05/2018   ROUX-EN-Y GASTRIC BYPASS  06/03/2017   SHOULDER ARTHROSCOPY Right    THYROIDECTOMY N/A 11/12/2015   Procedure: THYROIDECTOMY;  Surgeon: Clyde Canterbury, MD;  Location: [facility];  Service: ENT;  Laterality: N/A;   TOTAL HIP ARTHROPLASTY Right 11/27/2019   Procedure: TOTAL HIP ARTHROPLASTY ANTERIOR APPROACH;  Surgeon: Hessie Knows, MD;  Location: [facility];  Service: Orthopedics;  Laterality: Right;   TRIGGER FINGER RELEASE Right    TRIGGER FINGER RELEASE Right 04/24/2020   Procedure: Right ring and middle trigger release;  Surgeon: Hessie Knows, MD;  Location: [facility];  Service: Orthopedics;  Laterality: Right;      Social History:     Social History   Tobacco Use   Smoking status: Never   Smokeless tobacco: Never  Substance Use Topics   Alcohol use: No    Alcohol/week: 0.0 standard drinks of alcohol       Family History :     Family History  Problem Relation Age of Onset   Anxiety disorder Mother    Depression Mother    Alcohol abuse Mother    Diabetes Mother    Hypertension Mother    Kidney cancer Mother    Sleep apnea Mother    Alcohol abuse Father    Anxiety disorder Father    Depression Father    Post-traumatic stress disorder Father    Kidney failure Father    COPD Father    Diabetes Father    Hypertension Father    Sleep apnea Father    Depression Brother    Diabetes Brother    Hypertension Brother    Sleep apnea Brother    Breast cancer Paternal Aunt 55   Breast cancer Paternal Aunt 61   Brain cancer Paternal Uncle 68   Bladder Cancer Neg Hx    Prostate cancer Neg Hx      Home Medications:   Prior to Admission medications   Medication Sig Start Date End Date Taking? Authorizing Provider  acetaminophen (TYLENOL) 650 MG CR tablet Take 650-1,300 mg by mouth every 8 (eight) hours as needed for pain.   Yes [provider]  acidophilus (RISAQUAD) CAPS  capsule Take 1 capsule by mouth daily.   Yes [provider]  albuterol (PROVENTIL HFA;VENTOLIN HFA) 108 (90 BASE) MCG/ACT inhaler Inhale 2 puffs into the lungs every 6 (six) hours as needed for wheezing or shortness of breath.   Yes [provider]  amitriptyline (ELAVIL) 75 MG tablet Take 75 mg by mouth at bedtime.   Yes [provider]  Ascorbic Acid (VITAMIN C) 1000 MG tablet Take 1,000 mg by mouth daily.    Yes [provider]  Biotin 10 MG CAPS Take 10 mg by mouth daily.    Yes [provider]  butalbital-acetaminophen-caffeine (FIORICET) 50-325-40 MG tablet Take 1 tablet by mouth every 6 (six) hours as needed for migraine.  08/24/19  Yes [provider]  calcium carbonate (TUMS EX) 750 MG chewable tablet Chew 1 tablet by mouth daily.    Yes [provider]  clonazePAM (KLONOPIN) 0.5 MG tablet Take 0.5 mg by mouth 2 (two) times daily as needed for anxiety. 06/24/22  Yes [provider]  Cyanocobalamin (B-12) 2500 MCG TABS Take 2,500 mcg by mouth daily.   Yes [provider]  diclofenac sodium (VOLTAREN) 1 % GEL Apply 2 g topically 4 (four) times daily. Patient taking differently: Apply 2 g topically 3 (three) times daily as needed (pain). 05/29/19  Yes Noemi Chapel, MD  diphenhydrAMINE (BENADRYL) 25 MG tablet Take 25 mg by mouth every 8 (eight) hours as needed for itching or allergies.    Yes [provider]  EPINEPHrine 0.3 mg/0.3 mL IJ SOAJ injection Inject 0.3 mg into the muscle as needed for anaphylaxis.   Yes [provider]  ergocalciferol (VITAMIN D2) 1.25 MG (50000 UT) capsule Take 50,000 Units by mouth 2 (two) times a week.  10/25/19  Yes [provider]  famotidine (PEPCID) 20 MG tablet Take 20 mg by mouth at bedtime.   Yes [provider]  fluticasone furoate-vilanterol (BREO ELLIPTA) 200-25 MCG/INH AEPB Inhale 1 puff into the  lungs daily.   Yes [provider]  furosemide (LASIX) 20 MG tablet Take 20 mg by mouth daily as needed for fluid. 07/16/20  Yes [provider]  Galcanezumab-gnlm (EMGALITY) 120 MG/ML SOAJ Inject 120 mg into the skin every 30 (thirty) days.   Yes [provider]  levocetirizine (XYZAL) 5 MG tablet Take 5 mg by mouth at bedtime.  09/12/19  Yes [provider]  levothyroxine (SYNTHROID) 175 MCG tablet Take 175 mcg by mouth daily before breakfast. 07/14/22  Yes [provider]  lisinopril (ZESTRIL) 5 MG tablet Take 5 mg by mouth at bedtime.  12/11/18  Yes [provider]  magnesium oxide (MAG-OX) 400 MG tablet Take 1,200 mg by mouth 2 (two) times daily. Take 1200 mg by mouth in the morning and 1200 mg at bedtime 10/24/19  Yes [provider]  meclizine (ANTIVERT) 25 MG tablet Take 25 mg by mouth 3 (three) times daily as needed for dizziness.  09/19/19  Yes [provider]  metFORMIN (GLUCOPHAGE-XR) 500 MG 24 hr tablet Take 1,000 mg by mouth 2 (two) times daily.   Yes [provider]  metoprolol tartrate (LOPRESSOR) 100 MG tablet Take 100 mg by mouth 2 (two) times daily.   Yes [provider]  montelukast (SINGULAIR) 10 MG tablet Take 10 mg by mouth at bedtime.    Yes [provider]  Multiple Vitamin (TAB-A-VITE) TABS Take 1 tablet by mouth daily. 07/15/20  Yes [provider]  naphazoline-pheniramine (NAPHCON-A)  0.025-0.3 % ophthalmic solution Place 1 drop into both eyes 4 (four) times daily as needed for eye irritation.   Yes [provider]  naproxen sodium (ALEVE) 220 MG tablet Take 220 mg by mouth as needed (pain).   Yes [provider]  nitrofurantoin, macrocrystal-monohydrate, (MACROBID) 100 MG capsule Take 1 capsule (100 mg total) by mouth daily. 01/04/22  Yes MacDiarmid, Nicki Reaper, MD  omeprazole (PRILOSEC) 40 MG capsule Take 40 mg by mouth in the morning and at bedtime.   Yes  [provider]  oxyCODONE (OXY IR/ROXICODONE) 5 MG immediate release tablet Take 1 tablet (5 mg total) by mouth every 8 (eight) hours as needed for severe pain. Must last 30 days 07/27/22 08/26/22 Yes Milinda Pointer, MD  OZEMPIC, 0.25 OR 0.5 MG/DOSE, 2 MG/1.5ML SOPN Inject 1 mg into the skin once a week. 07/17/20  Yes [provider]  pregabalin (LYRICA) 225 MG capsule Take 1 capsule (225 mg total) by mouth 2 (two) times daily. 10/02/20  Yes Milinda Pointer, MD  QUEtiapine (SEROQUEL) 300 MG tablet Take 150 mg by mouth at bedtime.   Yes [provider]  rizatriptan (MAXALT) 10 MG tablet Take 1 and may repeat in 2 hours for MIgraines, alternate with the disintrgrating tablet. 09/04/20  Yes [provider]  rizatriptan (MAXALT-MLT) 10 MG disintegrating tablet Take 10 mg by mouth. Take 1 and may repeat in 2 hours  for Migraines 08/16/20  Yes [provider]  rosuvastatin (CRESTOR) 40 MG tablet Take 40 mg by mouth daily.  02/28/19  Yes [provider]  sertraline (ZOLOFT) 100 MG tablet Take 150 mg by mouth daily. 07/07/22  Yes [provider]  spironolactone (ALDACTONE) 25 MG tablet Take 37.5 mg by mouth 2 (two) times daily.    Yes [provider]  tiZANidine (ZANAFLEX) 4 MG tablet Take 1 tablet (4 mg total) by mouth 2 (two) times daily as needed for muscle spasms. 11/30/19  Yes Duanne Guess, PA-C  topiramate (TOPAMAX) 50 MG tablet Take 100 mg by mouth at bedtime.   Yes [provider]  ACCU-CHEK AVIVA PLUS test strip  03/13/19   [provider]  glucose blood (ACCU-CHEK AVIVA PLUS) test strip Use 2 (two) times daily 09/29/18   [provider]     Allergies:     Allergies  Allergen Reactions   Bactrim [Sulfamethoxazole-Trimethoprim] Hives   Omalizumab Itching and Hives   Ciprofloxacin Other (See Comments)    myalgia    Shellfish Allergy Other (See Comments)    + positive allergy test    Nsaids Nausea Only and Other (See Comments)    Stomach upset     Physical Exam:   Vitals  Blood pressure 105/65, pulse 85, temperature 98.2 F (36.8 C), temperature source Oral, resp. rate (!) 22, height '5\' 3"'$  (1.6 m), weight (!) 165.6 kg, SpO2 99 %.   1. General well-developed female, with morbid obesity lying in bed in NAD,    2. Normal affect and insight, Not Suicidal or Homicidal, Awake Alert, Oriented X 3.  3. No F.N deficits, ALL C.Nerves Intact, Strength 5/5 all 4 extremities, Sensation intact all 4 extremities, Plantars down going.  4. Ears and Eyes appear Normal, Conjunctivae clear, PERRLA. Moist Oral Mucosa.  5. Supple Neck, No JVD, No cervical lymphadenopathy appriciated, No Carotid Bruits.  6. Symmetrical Chest wall movement, Good air movement bilaterally, CTAB.  7. RRR, No Gallops, Rubs or Murmurs, No Parasternal Heave.  8. Positive Bowel Sounds, Abdomen  Soft, No tenderness, No organomegaly appriciated,No rebound -guarding or rigidity.  9.  No Cyanosis, Normal Skin Turgor, No Skin Rash or Bruise.  10. Good muscle tone,  joints appear normal , no effusions, Normal ROM.    Data Review:    CBC Recent Labs  Lab 08/07/22 1537  WBC 9.6  HGB 11.4*  HCT 35.7*  PLT 217  MCV 90.6  MCH 28.9  MCHC 31.9  RDW 13.5  LYMPHSABS 2.2  MONOABS 0.7  EOSABS 0.3  BASOSABS 0.1   ------------------------------------------------------------------------------------------------------------------  Chemistries  Recent Labs  Lab 08/07/22 1537  NA 135  K 5.3*  CL 108  CO2 18*  GLUCOSE 96  BUN 39*  CREATININE 2.68*  CALCIUM 7.3*  MG 2.5*  AST 25  ALT 40  ALKPHOS 74  BILITOT 0.5   ------------------------------------------------------------------------------------------------------------------ estimated creatinine clearance is 37.9 mL/min (A) (by C-G formula based on SCr of 2.68 mg/dL  (H)). ------------------------------------------------------------------------------------------------------------------ Recent Labs    08/07/22 1537  TSH 0.882    Coagulation profile Recent Labs  Lab 08/07/22 1537  INR 1.1   ------------------------------------------------------------------------------------------------------------------- No results for input(s): "DDIMER" in the last 72 hours. -------------------------------------------------------------------------------------------------------------------  Cardiac Enzymes No results for input(s): "CKMB", "TROPONINI", "MYOGLOBIN" in the last 168 hours.  Invalid input(s): "CK" ------------------------------------------------------------------------------------------------------------------    Component Value Date/Time   BNP 41.0 05/29/2019 1554     ---------------------------------------------------------------------------------------------------------------  Urinalysis    Component Value Date/Time   COLORURINE AMBER (A) 08/07/2022 1725   APPEARANCEUR CLEAR 08/07/2022 1725   APPEARANCEUR Cloudy (A) 08/04/2021 1044   LABSPEC 1.005 08/07/2022 1725   LABSPEC 1.003 03/25/2013 2018   PHURINE 5.0 08/07/2022 1725   GLUCOSEU NEGATIVE 08/07/2022 1725   GLUCOSEU Negative 03/25/2013 2018   HGBUR NEGATIVE 08/07/2022 1725   BILIRUBINUR NEGATIVE 08/07/2022 1725   BILIRUBINUR Negative 08/04/2021 1044   BILIRUBINUR Negative 03/25/2013 2018   KETONESUR NEGATIVE 08/07/2022 1725   PROTEINUR NEGATIVE 08/07/2022 1725   UROBILINOGEN 0.2 08/04/2020 1345   UROBILINOGEN 0.2 05/09/2015 0115   NITRITE POSITIVE (A) 08/07/2022 1725   LEUKOCYTESUR MODERATE (A) 08/07/2022 1725   LEUKOCYTESUR 2+ 03/25/2013 2018    ----------------------------------------------------------------------------------------------------------------   Imaging Results:    DG Chest Port 1 View  Result Date: 08/07/2022 CLINICAL DATA:  Hypotension and questionable  sepsis; evaluate for abnormality EXAM: PORTABLE CHEST 1 VIEW COMPARISON:  Radiographs 11/30/2019 FINDINGS: Stable cardiomegaly. Mild pulmonary vascular congestion. Question atelectasis versus infiltrates in the left costophrenic angle. No pneumothorax. No acute osseous abnormality. IMPRESSION: Question atelectasis versus infiltrates in the left lower lung. Cardiomegaly and pulmonary vascular congestion. Electronically Signed   By: Placido Sou M.D.   On: 08/07/2022 15:01    My personal review of EKG: Rhythm NSR, Rate  79 /min, QTc 445 , no Acute ST changes   Assessment & Plan:    Principal Problem:   Pre-syncope Active Problems:   Chronic pain syndrome   Neurogenic pain   Class 3 severe obesity due to excess calories with serious comorbidity and body mass index (BMI) of 60.0 to 69.9 in adult [facility])   GERD (gastroesophageal reflux disease)    Presyncope Hypotension -This is due to volume depletion, dehydration and low blood pressure, and infection from UTI -Continue with IV fluids, hold antihypertensive medications -Monitor on telemetry -Will consult PT in a.m. -Continue with IV fluids  -Hold antihypertensive medications (Aldactone, lisinopril, metoprolol and as needed Lasix)  AKI -Due to UTI, volume depletion and low blood pressure, hold Aldactone and lisinopril  UTI -She endorses dysuria, continue  with IV Rocephin and follow on urine cultures Hypertension-hold home medications due to low blood pressure on presentation, continue with IV fluid, resume home meds upon discharge  Hypothyroidism -Continue with Synthroid  Morbid obesity - Body mass index is 64.66 kg/m. -She is on Ozempic, hold during hospital stay -Status post sleeve/Roux-en-Y gastric bypass  Diabetes mellitus, type II -Hold Ozempic and metformin, keep an insulin sliding scale  History of asthma -No active wheezing, continue with Breo Ellipta, continue with Singulair, as needed  hydralazine  Fibromyalgia Neuropathic pain -Continue with Lyrica, amitriptyline  GERD -Continue with PPI and Pepcid  PCOS -hold metformin due to AKI, can be resumed on discharge  Hyperkalemia - Potassium of 5.3, hold Aldactone, recheck in a.m.       DVT Prophylaxis Heparin   AM Labs Ordered, also please review Full Orders  Family Communication: Admission, patients condition and plan of care including tests being ordered have been discussed with the patient and * who indicate understanding and agree with the plan and Code Status.  Code Status Full  Likely DC to  home  Condition GUARDED    Consults called: none    Admission status: Observation    Time spent in minutes : 70 minutes   Phillips Climes M.D on 08/07/2022 at 9:04 PM   [facility] - Office  [phone]

## 2022-08-07 NOTE — ED Notes (Signed)
Urine specimen was collected by patient, but dropped into the toilet. Will attempt recollection soon.

## 2022-08-07 NOTE — ED Triage Notes (Signed)
Pt brought in by [facility] for c/o hypotension; pt states she started feeling unwell today around lunch and states when she took her BP it was 70/40's at home  [facility] arrived and pt was found to have pressure of 60/40's  Pt was given 937m of NS by ems

## 2022-08-08 DIAGNOSIS — E860 Dehydration: Secondary | ICD-10-CM | POA: Diagnosis present

## 2022-08-08 DIAGNOSIS — I152 Hypertension secondary to endocrine disorders: Secondary | ICD-10-CM | POA: Diagnosis present

## 2022-08-08 DIAGNOSIS — F329 Major depressive disorder, single episode, unspecified: Secondary | ICD-10-CM | POA: Diagnosis present

## 2022-08-08 DIAGNOSIS — E1169 Type 2 diabetes mellitus with other specified complication: Secondary | ICD-10-CM | POA: Diagnosis present

## 2022-08-08 DIAGNOSIS — N179 Acute kidney failure, unspecified: Secondary | ICD-10-CM | POA: Diagnosis present

## 2022-08-08 DIAGNOSIS — E7800 Pure hypercholesterolemia, unspecified: Secondary | ICD-10-CM | POA: Diagnosis present

## 2022-08-08 DIAGNOSIS — N390 Urinary tract infection, site not specified: Secondary | ICD-10-CM | POA: Diagnosis present

## 2022-08-08 DIAGNOSIS — E1140 Type 2 diabetes mellitus with diabetic neuropathy, unspecified: Secondary | ICD-10-CM | POA: Diagnosis present

## 2022-08-08 DIAGNOSIS — R55 Syncope and collapse: Secondary | ICD-10-CM | POA: Diagnosis not present

## 2022-08-08 DIAGNOSIS — M792 Neuralgia and neuritis, unspecified: Secondary | ICD-10-CM

## 2022-08-08 DIAGNOSIS — B961 Klebsiella pneumoniae [K. pneumoniae] as the cause of diseases classified elsewhere: Secondary | ICD-10-CM | POA: Diagnosis present

## 2022-08-08 DIAGNOSIS — E875 Hyperkalemia: Secondary | ICD-10-CM | POA: Diagnosis present

## 2022-08-08 DIAGNOSIS — I959 Hypotension, unspecified: Secondary | ICD-10-CM | POA: Diagnosis present

## 2022-08-08 DIAGNOSIS — G894 Chronic pain syndrome: Secondary | ICD-10-CM

## 2022-08-08 DIAGNOSIS — Z825 Family history of asthma and other chronic lower respiratory diseases: Secondary | ICD-10-CM | POA: Diagnosis not present

## 2022-08-08 DIAGNOSIS — E890 Postprocedural hypothyroidism: Secondary | ICD-10-CM | POA: Diagnosis present

## 2022-08-08 DIAGNOSIS — Z8249 Family history of ischemic heart disease and other diseases of the circulatory system: Secondary | ICD-10-CM | POA: Diagnosis not present

## 2022-08-08 DIAGNOSIS — E282 Polycystic ovarian syndrome: Secondary | ICD-10-CM | POA: Diagnosis present

## 2022-08-08 DIAGNOSIS — Z803 Family history of malignant neoplasm of breast: Secondary | ICD-10-CM | POA: Diagnosis not present

## 2022-08-08 DIAGNOSIS — Z833 Family history of diabetes mellitus: Secondary | ICD-10-CM | POA: Diagnosis not present

## 2022-08-08 DIAGNOSIS — J45909 Unspecified asthma, uncomplicated: Secondary | ICD-10-CM | POA: Diagnosis present

## 2022-08-08 DIAGNOSIS — Z23 Encounter for immunization: Secondary | ICD-10-CM | POA: Diagnosis present

## 2022-08-08 DIAGNOSIS — M797 Fibromyalgia: Secondary | ICD-10-CM | POA: Diagnosis present

## 2022-08-08 DIAGNOSIS — Z6841 Body Mass Index (BMI) 40.0 and over, adult: Secondary | ICD-10-CM | POA: Diagnosis not present

## 2022-08-08 DIAGNOSIS — K219 Gastro-esophageal reflux disease without esophagitis: Secondary | ICD-10-CM | POA: Diagnosis present

## 2022-08-08 LAB — BASIC METABOLIC PANEL
Anion gap: 8 (ref 5–15)
BUN: 31 mg/dL — ABNORMAL HIGH (ref 6–20)
CO2: 19 mmol/L — ABNORMAL LOW (ref 22–32)
Calcium: 7.3 mg/dL — ABNORMAL LOW (ref 8.9–10.3)
Chloride: 113 mmol/L — ABNORMAL HIGH (ref 98–111)
Creatinine, Ser: 1.62 mg/dL — ABNORMAL HIGH (ref 0.44–1.00)
GFR, Estimated: 38 mL/min — ABNORMAL LOW (ref >60)
Glucose, Bld: 106 mg/dL — ABNORMAL HIGH (ref 70–99)
Potassium: 4.9 mmol/L (ref 3.5–5.1)
Sodium: 140 mmol/L (ref 135–145)

## 2022-08-08 LAB — CBC
HCT: 34.7 % — ABNORMAL LOW (ref 36.0–46.0)
Hemoglobin: 11.3 g/dL — ABNORMAL LOW (ref 12.0–15.0)
MCH: 29.4 pg (ref 26.0–34.0)
MCHC: 32.6 g/dL (ref 30.0–36.0)
MCV: 90.4 fL (ref 80.0–100.0)
Platelets: 201 10*3/uL (ref 150–400)
RBC: 3.84 MIL/uL — ABNORMAL LOW (ref 3.87–5.11)
RDW: 13.4 % (ref 11.5–15.5)
WBC: 7.3 10*3/uL (ref 4.0–10.5)
nRBC: 0 % (ref 0.0–0.2)

## 2022-08-08 LAB — GLUCOSE, CAPILLARY
Glucose-Capillary: 134 mg/dL — ABNORMAL HIGH (ref 70–99)
Glucose-Capillary: 118 mg/dL — ABNORMAL HIGH (ref 70–99)
Glucose-Capillary: 130 mg/dL — ABNORMAL HIGH (ref 70–99)
Glucose-Capillary: 139 mg/dL — ABNORMAL HIGH (ref 70–99)
Glucose-Capillary: 198 mg/dL — ABNORMAL HIGH (ref 70–99)

## 2022-08-08 LAB — HIV ANTIBODY (ROUTINE TESTING W REFLEX): HIV Screen 4th Generation wRfx: NONREACTIVE

## 2022-08-08 MED ORDER — OXYCODONE HCL 5 MG PO TABS
5.0000 mg | ORAL_TABLET | Freq: Three times a day (TID) | ORAL | Status: DC | PRN
  Administered 2022-08-08 – 2022-08-09 (×2): 5 mg via ORAL
  Filled 2022-08-08 (×3): qty 1

## 2022-08-08 MED ORDER — SODIUM CHLORIDE 0.45 % IV SOLN: INTRAVENOUS | Status: DC

## 2022-08-08 MED ORDER — PNEUMOCOCCAL VAC POLYVALENT 25 MCG/0.5ML IJ INJ
0.5000 mL | INJECTION | INTRAMUSCULAR | Status: AC
  Administered 2022-08-09: 0.5 mL via INTRAMUSCULAR
  Filled 2022-08-08: qty 0.5

## 2022-08-08 MED ORDER — BUTALBITAL-APAP-CAFFEINE 50-325-40 MG PO TABS
1.0000 | ORAL_TABLET | Freq: Four times a day (QID) | ORAL | Status: DC | PRN
  Administered 2022-08-08 (×2): 1 via ORAL
  Filled 2022-08-08 (×2): qty 1

## 2022-08-08 MED ORDER — MAGNESIUM OXIDE -MG SUPPLEMENT 400 (240 MG) MG PO TABS
1200.0000 mg | ORAL_TABLET | Freq: Two times a day (BID) | ORAL | Status: DC
  Administered 2022-08-08 – 2022-08-09 (×2): 1200 mg via ORAL
  Filled 2022-08-08: qty 3

## 2022-08-08 MED ORDER — OXYCODONE HCL 5 MG PO TABS
5.0000 mg | ORAL_TABLET | Freq: Once | ORAL | Status: DC
  Filled 2022-08-08: qty 1

## 2022-08-08 NOTE — Plan of Care (Signed)
  Problem: Acute Rehab PT Goals(only PT should resolve) Goal: Pt Will Go Supine/Side To Sit Outcome: Progressing Flowsheets (Taken 08/08/2022 0918) Pt will go Supine/Side to Sit: with supervision Goal: Patient Will Transfer Sit To/From Stand Outcome: Progressing Flowsheets (Taken 08/08/2022 0918) Patient will transfer sit to/from stand: with supervision Goal: Pt Will Transfer Bed To Chair/Chair To Bed Outcome: Progressing Flowsheets (Taken 08/08/2022 0918) Pt will Transfer Bed to Chair/Chair to Bed: min guard assist Goal: Pt Will Ambulate Outcome: Progressing Flowsheets (Taken 08/08/2022 0918) Pt will Ambulate:  with min guard assist  100 feet  with rolling walker
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
## 2022-08-08 NOTE — Care Management Obs Status (Signed)
[place] NOTIFICATION   Patient Details  Name: LUDDIE BOGHOSIAN MRN: [id number] Date of Birth: 04-15-1971   Medicare Observation Status Notification Given:  Yes    Iona Beard, Latanya Presser 08/08/2022, 1:37 PM
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
## 2022-08-08 NOTE — Evaluation (Signed)
Physical Therapy Evaluation Patient Details Name: MAGALI BRAY MRN: [id number] DOB: 1971/02/23 Today's Date: 08/08/2022  History of Present Illness  Angeleen Horney  is a 51 y.o. female, with past medical history of morbid obesity, diabetes mellitus, hypertension, hyperlipidemia, asthma, fibromyalgia, neuropathy, status post gastric sleeve/gastric bypass, patient presents to ED secondary to presyncope, she reports she has an argument with her significant other, started to feel like her vision is going blurry, and she could see bright light, feeling lightheaded, dizzy, felt she might pass out, so she went to sit down, she does report dysuria for the last 24 hours, increased urinary urgency, she reports 1 episode of diarrhea last night, report dry mouth currently, she denies any chest pain or shortness of breath, no loss of consciousness, no focal numbness, or focal deficits  -In ED she was noted to have low blood pressure 89/66, 91/52, this has responded nicely to IV fluids, her workup significant for worsening creatinine at 2.68, from baseline of 1, as well potassium of 5.3, she had positive UA, so [facility] hospitalist consulted to admit.    Clinical Impression  On therapist arrival; patient lying in bed; agreeable to therapy evaluation although she reports increased pain levels this morning as she usually has had pain medication by this time in the morning.  Patient performs supine to sit with HOB elevated modified independently; taking extra time due to pain.  She is able to sit on the edge of the bed with feet and hands supported with good sitting balance.  PT assisted her with sit to stand to RW by stabilizing the RW.  Patient able to stand with therapist min guard; she tends to forward flex at the trunk and lean on her elbows to support herself in standing.  Patient reports general stiffness and fatigue but no dizziness with standing.  Patient returns to sitting and then supine taking extra time to perform  with PT min guard. Patient left in bed with call button in reach; nursing notified of mobility status and patient request for pain medication.  Patient will benefit from continued skilled therapy services during the remainder of her hospital stay and at the next recommended venue of care to address deficits and promote return to optimal function.           Recommendations for follow up therapy are one component of a multi-disciplinary discharge planning process, led by the attending physician.  Recommendations may be updated based on patient status, additional functional criteria and insurance authorization.  Follow Up Recommendations Home health PT      Assistance Recommended at Discharge PRN  Patient can return home with the following  A little help with walking and/or transfers;A little help with bathing/dressing/bathroom;Help with stairs or ramp for entrance    Equipment Recommendations None recommended by PT  Recommendations for Other Services       Functional Status Assessment Patient has had a recent decline in their functional status and demonstrates the ability to make significant improvements in function in a reasonable and predictable amount of time.     Precautions / Restrictions Precautions Precautions: Fall Restrictions Weight Bearing Restrictions: No      Mobility  Bed Mobility Overal bed mobility: Modified Independent             General bed mobility comments: takes extra time to perform supine to sit and sit to supine Patient Response: Cooperative  Transfers Overall transfer level: Needs assistance Equipment used: Rolling walker (2 wheels) Transfers: Sit to/from Stand  Sit to Stand: Min guard, Min assist           General transfer comment: min assist to stabilize walker    Ambulation/Gait                  Stairs            Wheelchair Mobility    Modified Rankin (Stroke Patients Only)       Balance Overall balance  assessment: Needs assistance Sitting-balance support: Bilateral upper extremity supported, Feet unsupported Sitting balance-Leahy Scale: Good Sitting balance - Comments: good sitting balance on edge of bed with feet and both hands supporting   Standing balance support: Bilateral upper extremity supported, During functional activity, Reliant on assistive device for balance Standing balance-Leahy Scale: Fair Standing balance comment: fair to good standing balance with RW; patient tends to forward flex at the trunk and lean on the walker for support                             Pertinent Vitals/Pain Pain Assessment Pain Assessment: 0-10 Pain Score: 5  Pain Location: knees and shoulders Pain Descriptors / Indicators: Sore Pain Intervention(s): Limited activity within patient's tolerance, Patient requesting pain meds-RN notified    Home Living Family/patient expects to be discharged to:: Private residence Living Arrangements: Spouse/significant other Available Help at Discharge: Family;Available 24 hours/day Type of Home: House Home Access: Stairs to enter Entrance Stairs-Rails: None Entrance Stairs-Number of Steps: 1   Home Layout: One level Home Equipment: Shower seat - built Games developer (2 wheels);BSC/3in1;Rollator (4 wheels);Cane - single point;Hand held shower head      Prior Function Prior Level of Function : Independent/Modified Independent                     Hand Dominance   Dominant Hand: Right    Extremity/Trunk Assessment   Upper Extremity Assessment Upper Extremity Assessment: Generalized weakness    Lower Extremity Assessment Lower Extremity Assessment: Generalized weakness       Communication   Communication: No difficulties  Cognition Arousal/Alertness: Awake/alert Behavior During Therapy: WFL for tasks assessed/performed Overall Cognitive Status: Within Functional Limits for tasks assessed                                           General Comments      Exercises     Assessment/Plan    PT Assessment Patient needs continued PT services  PT Problem List Decreased strength;Decreased activity tolerance;Decreased mobility;Decreased balance;Obesity;Pain       PT Treatment Interventions Therapeutic activities;Gait training;Therapeutic exercise;Functional mobility training;Patient/family education;Neuromuscular re-education    PT Goals (Current goals can be found in the Care Plan section)  Acute Rehab PT Goals Patient Stated Goal: return home PT Goal Formulation: With patient Time For Goal Achievement: 08/22/22 Potential to Achieve Goals: Good    Frequency Min 2X/week     Co-evaluation               AM-PAC PT "6 Clicks" Mobility  Outcome Measure Help needed turning from your back to your side while in a flat bed without using bedrails?: A Little Help needed moving from lying on your back to sitting on the side of a flat bed without using bedrails?: A Little Help needed moving to and from a bed to a chair (  including a wheelchair)?: A Little Help needed standing up from a chair using your arms (e.g., wheelchair or bedside chair)?: A Little Help needed to walk in hospital room?: A Little Help needed climbing 3-5 steps with a railing? : A Lot 6 Click Score: 17    End of Session   Activity Tolerance: Patient limited by pain Patient left: in bed;with call bell/phone within reach Nurse Communication: Mobility status;Patient requests pain meds PT Visit Diagnosis: Muscle weakness (generalized) (M62.81);Other abnormalities of gait and mobility (R26.89)    Time: 0569-7948 PT Time Calculation (min) (ACUTE ONLY): 18 min   Charges:   PT Evaluation $PT Eval Low Complexity: 1 Low          9:18 AM, 08/08/22 Lon Klippel Small Amirrah Quigley MPT  physical therapy [place] [phone] [phone]

## 2022-08-08 NOTE — Progress Notes (Signed)
Breo placed in room for am inhaler start.

## 2022-08-09 DIAGNOSIS — G894 Chronic pain syndrome: Secondary | ICD-10-CM | POA: Diagnosis not present

## 2022-08-09 DIAGNOSIS — R55 Syncope and collapse: Secondary | ICD-10-CM | POA: Diagnosis not present

## 2022-08-09 DIAGNOSIS — M792 Neuralgia and neuritis, unspecified: Secondary | ICD-10-CM | POA: Diagnosis not present

## 2022-08-09 LAB — GLUCOSE, CAPILLARY
Glucose-Capillary: 119 mg/dL — ABNORMAL HIGH (ref 70–99)
Glucose-Capillary: 158 mg/dL — ABNORMAL HIGH (ref 70–99)

## 2022-08-09 LAB — BASIC METABOLIC PANEL
Anion gap: 5 (ref 5–15)
BUN: 17 mg/dL (ref 6–20)
CO2: 21 mmol/L — ABNORMAL LOW (ref 22–32)
Calcium: 7.8 mg/dL — ABNORMAL LOW (ref 8.9–10.3)
Chloride: 116 mmol/L — ABNORMAL HIGH (ref 98–111)
Creatinine, Ser: 1.04 mg/dL — ABNORMAL HIGH (ref 0.44–1.00)
GFR, Estimated: >60 mL/min (ref >60)
Glucose, Bld: 117 mg/dL — ABNORMAL HIGH (ref 70–99)
Potassium: 4.8 mmol/L (ref 3.5–5.1)
Sodium: 142 mmol/L (ref 135–145)

## 2022-08-09 LAB — HEMOGLOBIN A1C
Hgb A1c MFr Bld: 7.6 % — ABNORMAL HIGH (ref 4.8–5.6)
Mean Plasma Glucose: 171 mg/dL

## 2022-08-09 MED ORDER — METOPROLOL TARTRATE 50 MG PO TABS: 50.0000 mg | ORAL_TABLET | Freq: Two times a day (BID) | ORAL | 0 refills | Status: DC

## 2022-08-09 MED ORDER — CEFADROXIL 500 MG PO CAPS
1000.0000 mg | ORAL_CAPSULE | Freq: Two times a day (BID) | ORAL | Status: DC
  Administered 2022-08-09: 1000 mg via ORAL
  Filled 2022-08-09 (×4): qty 2

## 2022-08-09 MED ORDER — CEFADROXIL 500 MG PO CAPS: 1000.0000 mg | ORAL_CAPSULE | Freq: Two times a day (BID) | ORAL | 0 refills | Status: AC

## 2022-08-09 NOTE — TOC Progression Note (Signed)
Transition of Care [facility]) - Progression Note    Patient Details  Name: Karen Lewis MRN: [id number] Date of Birth: May 04, 1971  [facility]) CM/SW Contact  Ihor Gully, LCSW Phone Number: 08/09/2022, 12:43 PM  Clinical Narrative:    [facility] Lemmings is accepting of patient's insurance and referral to [facility]. Patient states that she doesn't need DV resources. She is agreeable to mental health resources being added to her AVS.    Expected Discharge Plan: [place] Barriers to Discharge: Continued Medical Work up  Expected Discharge Plan and Services Expected Discharge Plan: [place] In-house Referral: Clinical Social Work Discharge Planning Services: CM Consult Post Acute Care Choice: [place] arrangements for the past 2 months: [place]: PT [place]: [place] Date [place]: 08/09/22 Time [place]: [id number] Representative spoke with at [place]: [place] ([place]) Interventions    Readmission Risk Interventions    08/09/2022   12:42 PM 12/04/2019    2:17 PM  Readmission Risk Prevention Plan  Transportation Screening Complete Complete  PCP or Specialist Appt within 3-5 Days  Complete  [place] or [place]  Complete  Palliative Care Screening  Not Applicable  Medication Review (RN Care Manager) Complete Referral to Pharmacy  PCP or Specialist appointment within 3-5 days of discharge Complete   [place]RI or [place] Complete   SW Recovery Care/Counseling Consult Complete   [place] Not Applicable

## 2022-08-09 NOTE — Discharge Summary (Signed)
Physician Discharge Summary  Karen Lewis LYY:[id number] DOB: 1971-03-20 DOA: 08/07/2022  PCP: Idelle Crouch, MD  Admit date: 08/07/2022 Discharge date: 08/09/2022  Admitted From: Home Disposition: Home  Recommendations for Outpatient Follow-up:  Follow up with PCP in 1-2 weeks Please obtain BMP/CBC in one week  Home Health: Home health PT Equipment/Devices:  Discharge Condition: Stable CODE STATUS: Full code Diet recommendation: Heart healthy, carb modified  Brief/Interim Summary: Karen Lewis  is a 51 y.o. female, with past medical history of morbid obesity, diabetes mellitus, hypertension, hyperlipidemia, asthma, fibromyalgia, neuropathy, status post gastric sleeve/gastric bypass, patient presents to ED secondary to presyncope, she reports she has an argument with her significant other, started to feel like her vision is going blurry, and she could see bright light, feeling lightheaded, dizzy, felt she might pass out, so she went to sit down, she does report dysuria for the last 24 hours, increased urinary urgency, she reports 1 episode of diarrhea last night, report dry mouth currently, she denies any chest pain or shortness of breath, no loss of consciousness, no focal numbness, or focal deficits -In ED she was noted to have low blood pressure 89/66, 91/52, this has responded nicely to IV fluids, her workup significant for worsening creatinine at 2.68, from baseline of 1, as well potassium of 5.3, she had positive UA, so [facility] hospital[facility] consulted to admit.  Discharge Diagnoses:  Principal Problem:   Pre-syncope Active Problems:   Chronic pain syndrome   Neurogenic pain   Class 3 severe obesity due to excess calories with serious comorbidity and body mass index (BMI) of 60.0 to 69.9 in adult [facility])   Fibromyalgia   Hypertension associated with diabetes ([place])   GERD (gastroesophageal reflux disease)   AKI (acute kidney injury) (HCC)  Presyncope Hypotension -This is due to  volume depletion, dehydration and low blood pressure, and infection from UTI -She was treated with IV fluids and improved -seen by PT with recs for HHPT    AKI -Due to UTI, volume depletion and low blood pressure, hold Aldactone and lisinopril -Creatinine has significantly improved with IV fluids -Would continue to hold lisinopril for now since she is normotensive -Resume lisinopril/Aldactone as an outpatient   UTI -She endorses dysuria -Started on Rocephin -Urine culture positive for gram-negative rods -Since she was clinically improving with Rocephin, she has been transitioned to cefadroxil to complete her course   Hypertension -Patient hypotensive on admission -For the time of discharge, she was normotensive.  Will be resuming metoprolol at reduced dose -Remainder of antihypertensives can be resumed as an outpatient   Hypothyroidism -Continue with Synthroid   Obesity, Class 3 - Body mass index is 64.66 kg/m. -She is on Ozempic, resume on discharge -Status post sleeve/Roux-en-Y gastric bypass 2018   Diabetes mellitus, type II -Resume semaglutide and metformin on discharge -blood sugars currently stable   History of asthma -No active wheezing, continue with Breo Ellipta, continue with Singulair   Fibromyalgia Neuropathic pain -Continue with Lyrica, amitriptyline   GERD -Continue with PPI and Pepcid   PCOS -Resume metformin on discharge -Resume Aldactone as an outpatient   Hyperkalemia - improved with IV fluids  Discharge Instructions  Discharge Instructions     Diet - low sodium heart healthy   Complete by: As directed    Increase activity slowly   Complete by: As directed       Allergies as of 08/09/2022       Reactions   Bactrim [sulfamethoxazole-trimethoprim] Hives   Omalizumab  Itching, Hives   Ciprofloxacin Other (See Comments)   myalgia   Shellfish Allergy Other (See Comments)   + positive allergy test   Nsaids Nausea Only, Other (See  Comments)   Stomach upset        Medication List     STOP taking these medications    lisinopril 5 MG tablet Commonly known as: ZESTRIL   naproxen sodium 220 MG tablet Commonly known as: ALEVE   spironolactone 25 MG tablet Commonly known as: ALDACTONE       TAKE these medications    Accu-Chek Aviva Plus test strip Generic drug: glucose blood Use 2 (two) times daily   Accu-Chek Aviva Plus test strip Generic drug: glucose blood   acetaminophen 650 MG CR tablet Commonly known as: TYLENOL Take 650-1,300 mg by mouth every 8 (eight) hours as needed for pain.   acidophilus Caps capsule Take 1 capsule by mouth daily.   albuterol 108 (90 Base) MCG/ACT inhaler Commonly known as: VENTOLIN HFA Inhale 2 puffs into the lungs every 6 (six) hours as needed for wheezing or shortness of breath.   amitriptyline 75 MG tablet Commonly known as: ELAVIL Take 75 mg by mouth at bedtime.   B-12 2500 MCG Tabs Take 2,500 mcg by mouth daily.   Biotin 10 MG Caps Take 10 mg by mouth daily.   butalbital-acetaminophen-caffeine 50-325-40 MG tablet Commonly known as: FIORICET Take 1 tablet by mouth every 6 (six) hours as needed for migraine.   calcium carbonate 750 MG chewable tablet Commonly known as: TUMS EX Chew 1 tablet by mouth daily.   cefadroxil 500 MG capsule Commonly known as: DURICEF Take 2 capsules (1,000 mg total) by mouth 2 (two) times daily for 4 days.   clonazePAM 0.5 MG tablet Commonly known as: KLONOPIN Take 0.5 mg by mouth 2 (two) times daily as needed for anxiety.   diclofenac sodium 1 % Gel Commonly known as: Voltaren Apply 2 g topically 4 (four) times daily. What changed:  when to take this reasons to take this   diphenhydrAMINE 25 MG tablet Commonly known as: BENADRYL Take 25 mg by mouth every 8 (eight) hours as needed for itching or allergies.   Emgality 120 MG/ML Soaj Generic drug: Galcanezumab-gnlm Inject 120 mg into the skin every 30 (thirty)  days.   EPINEPHrine 0.3 mg/0.3 mL Soaj injection Commonly known as: EPI-PEN Inject 0.3 mg into the muscle as needed for anaphylaxis.   ergocalciferol 1.25 MG (50000 UT) capsule Commonly known as: VITAMIN D2 Take 50,000 Units by mouth 2 (two) times a week.   famotidine 20 MG tablet Commonly known as: PEPCID Take 20 mg by mouth at bedtime.   fluticasone furoate-vilanterol 200-25 MCG/INH Aepb Commonly known as: BREO ELLIPTA Inhale 1 puff into the lungs daily.   furosemide 20 MG tablet Commonly known as: LASIX Take 20 mg by mouth daily as needed for fluid.   levocetirizine 5 MG tablet Commonly known as: XYZAL Take 5 mg by mouth at bedtime.   levothyroxine 175 MCG tablet Commonly known as: SYNTHROID Take 175 mcg by mouth daily before breakfast.   magnesium oxide 400 MG tablet Commonly known as: MAG-OX Take 1,200 mg by mouth 2 (two) times daily. Take 1200 mg by mouth in the morning and 1200 mg at bedtime   meclizine 25 MG tablet Commonly known as: ANTIVERT Take 25 mg by mouth 3 (three) times daily as needed for dizziness.   metFORMIN 500 MG 24 hr tablet Commonly known as: GLUCOPHAGE-XR Take  1,000 mg by mouth 2 (two) times daily.   metoprolol tartrate 50 MG tablet Commonly known as: LOPRESSOR Take 1 tablet (50 mg total) by mouth 2 (two) times daily. What changed:  medication strength how much to take   montelukast 10 MG tablet Commonly known as: SINGULAIR Take 10 mg by mouth at bedtime.   naphazoline-pheniramine 0.025-0.3 % ophthalmic solution Commonly known as: NAPHCON-A Place 1 drop into both eyes 4 (four) times daily as needed for eye irritation.   nitrofurantoin (macrocrystal-monohydrate) 100 MG capsule Commonly known as: Macrobid Take 1 capsule (100 mg total) by mouth daily.   omeprazole 40 MG capsule Commonly known as: PRILOSEC Take 40 mg by mouth in the morning and at bedtime.   oxyCODONE 5 MG immediate release tablet Commonly known as: Oxy  IR/ROXICODONE Take 1 tablet (5 mg total) by mouth every 8 (eight) hours as needed for severe pain. Must last 30 days   Ozempic (0.25 or 0.5 MG/DOSE) 2 MG/1.5ML Sopn Generic drug: Semaglutide(0.25 or 0.'5MG'$ /DOS) Inject 1 mg into the skin once a week.   pregabalin 225 MG capsule Commonly known as: LYRICA Take 1 capsule (225 mg total) by mouth 2 (two) times daily.   QUEtiapine 300 MG tablet Commonly known as: SEROQUEL Take 150 mg by mouth at bedtime.   rizatriptan 10 MG disintegrating tablet Commonly known as: MAXALT-MLT Take 10 mg by mouth. Take 1 and may repeat in 2 hours  for Migraines   rizatriptan 10 MG tablet Commonly known as: MAXALT Take 1 and may repeat in 2 hours for MIgraines, alternate with the disintrgrating tablet.   rosuvastatin 40 MG tablet Commonly known as: CRESTOR Take 40 mg by mouth daily.   sertraline 100 MG tablet Commonly known as: ZOLOFT Take 150 mg by mouth daily.   Tab-A-Vite Tabs Take 1 tablet by mouth daily.   tiZANidine 4 MG tablet Commonly known as: ZANAFLEX Take 1 tablet (4 mg total) by mouth 2 (two) times daily as needed for muscle spasms.   topiramate 50 MG tablet Commonly known as: TOPAMAX Take 100 mg by mouth at bedtime.   vitamin C 1000 MG tablet Take 1,000 mg by mouth daily.        Follow-up Information     [facility] Follow up.   Why: home health physical therapy will be provided by [facility].        [facility]. Schedule an appointment as soon as possible for a visit.   Specialty: Behavioral Health Why: Schedule appointment as necessary. Contact information: [address] [place] [phone]        Services, [facility] Follow up.   Why: Another option for mental health services Contact information: [place] [address] [phone]                Allergies  Allergen Reactions   Bactrim  [Sulfamethoxazole-Trimethoprim] Hives   Omalizumab Itching and Hives   Ciprofloxacin Other (See Comments)    myalgia    Shellfish Allergy Other (See Comments)    + positive allergy test   Nsaids Nausea Only and Other (See Comments)    Stomach upset    Consultations:    Procedures/Studies: DG Chest Port 1 View  Result Date: 08/07/2022 CLINICAL DATA:  Hypotension and questionable sepsis; evaluate for abnormality EXAM: PORTABLE CHEST 1 VIEW COMPARISON:  Radiographs 11/30/2019 FINDINGS: Stable cardiomegaly. Mild pulmonary vascular congestion. Question atelectasis versus infiltrates in the left costophrenic  angle. No pneumothorax. No acute osseous abnormality. IMPRESSION: Question atelectasis versus infiltrates in the left lower lung. Cardiomegaly and pulmonary vascular congestion. Electronically Signed   By: Placido Sou M.D.   On: 08/07/2022 15:01      Subjective: She is feeling better today.  Tolerating diet.  Discharge Exam: Vitals:   08/08/22 2023 08/09/22 0521 08/09/22 0726 08/09/22 1451  BP: 126/67 124/75  (!) 144/87  Pulse: 100 89  92  Resp: '20 17  18  '$ Temp: 98.7 F (37.1 C) 98.5 F (36.9 C)  98.5 F (36.9 C)  TempSrc:  Oral  Oral  SpO2: 100% 97% 98% 99%  Weight:      Height:        General: Pt is alert, awake, not in acute distress Cardiovascular: RRR, S1/S2 +, no rubs, no gallops Respiratory: CTA bilaterally, no wheezing, no rhonchi Abdominal: Soft, NT, ND, bowel sounds + Extremities: no edema, no cyanosis    The results of significant diagnostics from this hospitalization (including imaging, microbiology, ancillary and laboratory) are listed below for reference.     Microbiology: Recent Results (from the past 240 hour(s))  Blood Culture (routine x 2)     Status: None (Preliminary result)   Collection Time: 08/07/22  3:37 PM   Specimen: Right Antecubital; Blood  Result Value Ref Range Status   Specimen Description   Final    RIGHT ANTECUBITAL  BOTTLES DRAWN AEROBIC AND ANAEROBIC   Special Requests Blood Culture adequate volume  Final   Culture   Final    NO GROWTH 2 DAYS Performed at [facility], [address], [place], [place] [address]    Report Status PENDING  Incomplete  Blood Culture (routine x 2)     Status: None (Preliminary result)   Collection Time: 08/07/22  4:11 PM   Specimen: BLOOD LEFT HAND  Result Value Ref Range Status   Specimen Description   Final    BLOOD LEFT HAND BOTTLES DRAWN AEROBIC AND ANAEROBIC   Special Requests Blood Culture adequate volume  Final   Culture   Final    NO GROWTH 2 DAYS Performed at [facility], [address] [place], [place] [address]    Report Status PENDING  Incomplete  Urine Culture     Status: Abnormal (Preliminary result)   Collection Time: 08/07/22  5:25 PM   Specimen: In/Out Cath Urine  Result Value Ref Range Status   Specimen Description   Final    IN/OUT CATH URINE Performed at [facility], [address], [place], [name] [address]    Special Requests   Final    NONE Performed at [facility], [address] [place], [place] [address]    Culture (A)  Final    >=100,000 COLONIES/mL KLEBSIELLA PNEUMONIAE SUSCEPTIBILITIES TO FOLLOW Performed at [facility], [place] [address], [place], [place] [address]    Report Status PENDING  Incomplete     Labs: BNP (last 3 results) No results for input(s): "BNP" in the last 8760 hours. Basic Metabolic Panel: Recent Labs  Lab 08/07/22 1537 08/08/22 0351 08/09/22 0418  NA 135 140 142  K 5.3* 4.9 4.8  CL 108 113* 116*  CO2 18* 19* 21*  GLUCOSE 96 106* 117*  BUN 39* 31* 17  CREATININE 2.68* 1.62* 1.04*  CALCIUM 7.3* 7.3* 7.8*  MG 2.5*  --   --    Liver Function Tests: Recent Labs  Lab 08/07/22 1537  AST 25  ALT 40  ALKPHOS 74  BILITOT 0.5  PROT 6.2*  ALBUMIN 3.5   Recent Labs  Lab 08/07/22 1537  LIPASE 37   No results for input(s): "AMMONIA" in the last 168 hours. CBC: Recent Labs  Lab  08/07/22 1537 08/08/22 0351  WBC 9.6 7.3  NEUTROABS 6.4  --   HGB 11.4* 11.3*  HCT 35.7* 34.7*  MCV 90.6 90.4  PLT 217 201   Cardiac Enzymes: No results for input(s): "CKTOTAL", "CKMB", "CKMBINDEX", "TROPONINI" in the last 168 hours. BNP: Invalid input(s): "POCBNP" CBG: Recent Labs  Lab 08/08/22 1140 08/08/22 1641 08/08/22 2025 08/09/22 0812 08/09/22 1152  GLUCAP 130* 139* 198* 119* 158*   D-Dimer No results for input(s): "DDIMER" in the last 72 hours. Hgb A1c Recent Labs    08/07/22 1533  HGBA1C 7.6*   Lipid Profile No results for input(s): "CHOL", "HDL", "LDLCALC", "TRIG", "CHOLHDL", "LDLDIRECT" in the last 72 hours. Thyroid function studies Recent Labs    08/07/22 1537  TSH 0.882   Anemia work up No results for input(s): "VITAMINB12", "FOLATE", "FERRITIN", "TIBC", "IRON", "RETICCTPCT" in the last 72 hours. Urinalysis    Component Value Date/Time   COLORURINE AMBER (A) 08/07/2022 1725   APPEARANCEUR CLEAR 08/07/2022 1725   APPEARANCEUR Cloudy (A) 08/04/2021 1044   LABSPEC 1.005 08/07/2022 1725   LABSPEC 1.003 03/25/2013 2018   PHURINE 5.0 08/07/2022 1725   GLUCOSEU NEGATIVE 08/07/2022 1725   GLUCOSEU Negative 03/25/2013 2018   HGBUR NEGATIVE 08/07/2022 1725   BILIRUBINUR NEGATIVE 08/07/2022 1725   BILIRUBINUR Negative 08/04/2021 1044   BILIRUBINUR Negative 03/25/2013 2018   KETONESUR NEGATIVE 08/07/2022 1725   PROTEINUR NEGATIVE 08/07/2022 1725   UROBILINOGEN 0.2 08/04/2020 1345   UROBILINOGEN 0.2 05/09/2015 0115   NITRITE POSITIVE (A) 08/07/2022 1725   LEUKOCYTESUR MODERATE (A) 08/07/2022 1725   LEUKOCYTESUR 2+ 03/25/2013 2018   Sepsis Labs Recent Labs  Lab 08/07/22 1537 08/08/22 0351  WBC 9.6 7.3   Microbiology Recent Results (from the past 240 hour(s))  Blood Culture (routine x 2)     Status: None (Preliminary result)   Collection Time: 08/07/22  3:37 PM   Specimen: Right Antecubital; Blood  Result Value Ref Range Status   Specimen  Description   Final    RIGHT ANTECUBITAL BOTTLES DRAWN AEROBIC AND ANAEROBIC   Special Requests Blood Culture adequate volume  Final   Culture   Final    NO GROWTH 2 DAYS Performed at [facility] [address] [place], [place] [address]    Report Status PENDING  Incomplete  Blood Culture (routine x 2)     Status: None (Preliminary result)   Collection Time: 08/07/22  4:11 PM   Specimen: BLOOD LEFT HAND  Result Value Ref Range Status   Specimen Description   Final    BLOOD LEFT HAND BOTTLES DRAWN AEROBIC AND ANAEROBIC   Special Requests Blood Culture adequate volume  Final   Culture   Final    NO GROWTH 2 DAYS Performed at [facility], [address], [place], [place] [address]    Report Status PENDING  Incomplete  Urine Culture     Status: Abnormal (Preliminary result)   Collection Time: 08/07/22  5:25 PM   Specimen: In/Out Cath Urine  Result Value Ref Range Status   Specimen Description   Final    IN/OUT CATH URINE Performed at [facility], [address], [place], [place] [address]    Special Requests   Final    NONE Performed at [facility], [place]  [address] [place], [place] [address]    Culture (A)  Final    >=100,000 COLONIES/mL KLEBSIELLA PNEUMONIAE SUSCEPTIBILITIES TO FOLLOW Performed at [facility], [place] [address], [place],  [address]    Report Status PENDING  Incomplete     Time coordinating discharge: 80mns  SIGNED:   JKathie Dike MD  [facility] 08/09/2022, 8:26 PM   If 7PM-7AM, please contact night-coverage [email or web address]

## 2022-08-09 NOTE — Care Management Important Message (Signed)
Important Message  Patient Details  Name: Karen Lewis MRN: [id number] Date of Birth: [birth year]   Medicare Important Message Given:  N/A - LOS <3 / Initial given by admissions     Tommy Medal 08/09/2022, 11:26 AM

## 2022-08-10 LAB — URINE CULTURE: Culture: >=100000 — AB

## 2022-08-12 DIAGNOSIS — G43719 Chronic migraine without aura, intractable, without status migrainosus: Secondary | ICD-10-CM | POA: Diagnosis not present

## 2022-08-12 LAB — CULTURE, BLOOD (ROUTINE X 2)
Culture: NO GROWTH
Culture: NO GROWTH
Special Requests: ADEQUATE
Special Requests: ADEQUATE

## 2022-08-18 ENCOUNTER — Encounter: Payer: Medicare HMO | Admitting: Pain Medicine

## 2022-08-18 DIAGNOSIS — E1169 Type 2 diabetes mellitus with other specified complication: Secondary | ICD-10-CM | POA: Diagnosis not present

## 2022-08-18 DIAGNOSIS — M79669 Pain in unspecified lower leg: Secondary | ICD-10-CM | POA: Diagnosis not present

## 2022-08-18 DIAGNOSIS — I10 Essential (primary) hypertension: Secondary | ICD-10-CM | POA: Diagnosis not present

## 2022-08-18 DIAGNOSIS — Z79899 Other long term (current) drug therapy: Secondary | ICD-10-CM | POA: Diagnosis not present

## 2022-08-18 DIAGNOSIS — R079 Chest pain, unspecified: Secondary | ICD-10-CM | POA: Diagnosis not present

## 2022-08-18 DIAGNOSIS — N390 Urinary tract infection, site not specified: Secondary | ICD-10-CM | POA: Diagnosis not present

## 2022-08-18 DIAGNOSIS — R059 Cough, unspecified: Secondary | ICD-10-CM | POA: Diagnosis not present

## 2022-08-18 DIAGNOSIS — M79662 Pain in left lower leg: Secondary | ICD-10-CM | POA: Diagnosis not present

## 2022-08-19 ENCOUNTER — Other Ambulatory Visit: Payer: Self-pay | Admitting: Internal Medicine

## 2022-08-19 DIAGNOSIS — M79662 Pain in left lower leg: Secondary | ICD-10-CM

## 2022-08-19 DIAGNOSIS — R059 Cough, unspecified: Secondary | ICD-10-CM | POA: Diagnosis not present

## 2022-08-20 ENCOUNTER — Ambulatory Visit
Admission: RE | Admit: 2022-08-20 | Discharge: 2022-08-20 | Disposition: A | Discharge status: Home / Self Care | Payer: Medicare HMO | Source: Ambulatory Visit | Attending: Internal Medicine | Admitting: Internal Medicine

## 2022-08-20 DIAGNOSIS — N390 Urinary tract infection, site not specified: Secondary | ICD-10-CM | POA: Diagnosis not present

## 2022-08-20 DIAGNOSIS — G894 Chronic pain syndrome: Secondary | ICD-10-CM | POA: Diagnosis not present

## 2022-08-20 DIAGNOSIS — F331 Major depressive disorder, recurrent, moderate: Secondary | ICD-10-CM | POA: Diagnosis not present

## 2022-08-20 DIAGNOSIS — M79662 Pain in left lower leg: Secondary | ICD-10-CM | POA: Diagnosis not present

## 2022-08-20 DIAGNOSIS — B9689 Other specified bacterial agents as the cause of diseases classified elsewhere: Secondary | ICD-10-CM | POA: Diagnosis not present

## 2022-08-20 DIAGNOSIS — N179 Acute kidney failure, unspecified: Secondary | ICD-10-CM | POA: Diagnosis not present

## 2022-08-20 DIAGNOSIS — M170 Bilateral primary osteoarthritis of knee: Secondary | ICD-10-CM | POA: Diagnosis not present

## 2022-08-20 DIAGNOSIS — R600 Localized edema: Secondary | ICD-10-CM | POA: Diagnosis not present

## 2022-08-20 DIAGNOSIS — J45909 Unspecified asthma, uncomplicated: Secondary | ICD-10-CM | POA: Diagnosis not present

## 2022-08-20 DIAGNOSIS — E1140 Type 2 diabetes mellitus with diabetic neuropathy, unspecified: Secondary | ICD-10-CM | POA: Diagnosis not present

## 2022-08-22 NOTE — Progress Notes (Unsigned)
PROVIDER NOTE: Information contained herein reflects review and annotations entered in association with encounter. Interpretation of such information and data should be left to medically-trained personnel. Information provided to patient can be located elsewhere in the medical record under "Patient Instructions". Document created using STT-dictation technology, any transcriptional errors that may result from process are unintentional.    Patient: Karen Lewis  Service Category: E/M  Provider: Gaspar Cola, MD  DOB: May 05, 1971  DOS: 08/23/2022  Referring Provider: Idelle Crouch, MD  MRN: [id number]  Specialty: Interventional Pain Management  PCP: Idelle Crouch, MD  Type: Established Patient  Setting: Ambulatory outpatient    Location: Office  Delivery: Face-to-face     HPI  [name], a 51 y.o. year old female, is here today because of her Chronic pain syndrome [G89.4]. Karen Lewis primary complain today is No chief complaint on file. Last encounter: My last encounter with her was on 06/29/2022. Pertinent problems: Karen Lewis has Fibromyalgia; Chronic knee pain (1ry area of Pain) (Bilateral) (R>L); Chronic low back pain (3ry area of Pain) (Bilateral) (R>L) w/o sciatica; Lumbar facet syndrome (Bilateral) (R>L); Secondary osteoarthritis of multiple sites; Grade 1  Anterolisthesis of L3/L4 & L4/L5 (1.4 cm); Chronic hip pain (2ry area of Pain) (Right); S/P THR (total hip replacement) (Left); Lumbar spondylosis; Chronic pain syndrome; Neurogenic pain; Upper extremity pain; Chronic shoulder pain (Left); Osteoarthritis of shoulder (Left); Osteoarthritis of hip (Right); Secondary Osteoarthritis of knee (Bilateral) (R>L); Spondylosis without myelopathy or radiculopathy, lumbosacral region; Panniculitis; Leg swelling; Edema of both legs; History of total hip replacement (Bilateral); S/P THR (total hip replacement) (Right) (11/27/2019); Pain and numbness of left upper extremity; Cervical radiculitis  (Left); Bilateral primary osteoarthritis of knee; DDD (degenerative disc disease), lumbosacral; Osteoarthritis of lumbar spine without myelopathy or radiculopathy; Lumbosacral radiculopathy at S1 (Left); Tricompartment osteoarthritis of knees (Bilateral); Lumbosacral radiculopathy at L2 (Right); Abnormal MRI, lumbar spine (11/10/2021); Chronic thigh pain (Right); Weakness of leg (Right); Lumbar interspinous bursitis (L2-L5); Spinal enthesopathy of lumbar region [facility]); Lumbar facet arthropathy (Multilevel) (Bilateral); Lumbar central spinal stenosis, w/o claudication (L2-3, L4-5, L5-S1); Lumbar foraminal stenosis (Bilateral: L2-3, L4-5) (L>R); Lumbosacral lateral recess stenosis (Bilateral: L4-5, L5-S1); and Trigger point of right shoulder region on their pertinent problem list. Pain Assessment: Severity of   is reported as a  /10. Location:    / . Onset:  . Quality:  . Timing:  . Modifying factor(s):  Marland Kitchen Vitals:  vitals were not taken for this visit.  BMI: Estimated body mass index is 64.66 kg/m as calculated from the following:   Height as of 08/07/22: _0  (1.6 m).   Weight as of 08/07/22: 365 lb (165.6 kg).  Reason for encounter: medication management. ***  R3/13/2024  Nonopioid transferred 06/24/2020: Lyrica  Pharmacotherapy Assessment  Analgesic: Oxycodone IR 5 mg 1 tab PO 5X/day (#150/mo) (25 mg/day) MME/day: 37.5 mg/day.   Monitoring: [place] PMP: PDMP reviewed during this encounter.       Pharmacotherapy: No side-effects or adverse reactions reported. Compliance: No problems identified. Effectiveness: Clinically acceptable.  No notes on file  No results found for: "CBDTHCR" No results found for: "D8THCCBX" No results found for: "D9THCCBX"  UDS:  Summary  Date Value Ref Range Status  10/26/2021 Note  Final    Comment:    ==================================================================== [employer] [facility] [employer]  (MW) ==================================================================== Test  Result       Flag       Units  Drug Present and Declared for Prescription Verification   7-aminoclonazepam              48           EXPECTED   ng/mg creat    7-aminoclonazepam is an expected metabolite of clonazepam. Source of    clonazepam is a scheduled prescription medication.    Noroxycodone                   518          EXPECTED   ng/mg creat    Noroxycodone is an expected metabolite of oxycodone. Sources of    oxycodone include scheduled prescription medications.  Drug Absent but Declared for Prescription Verification   Oxycodone                      Not Detected UNEXPECTED ng/mg creat    Oxycodone is almost always present in patients taking this drug    consistently.  Absence of oxycodone could be due to lapse of time    since the last dose or unusual pharmacokinetics (rapid metabolism).    Butalbital                     Not Detected UNEXPECTED ==================================================================== Test                      Result    Flag   Units      Ref Range   Creatinine              88               mg/dL      >=20 ==================================================================== Declared Medications:  The flagging and interpretation on this report are based on the  following declared medications.  Unexpected results may arise from  inaccuracies in the declared medications.   **Note: The testing scope of this panel includes these medications:   Butalbital (Fioricet)  Clonazepam (Klonopin)  Oxycodone   **Note: The testing scope of this panel does not include the  following reported medications:   Acetaminophen (Tylenol)  Acetaminophen (Fioricet)  Albuterol (Ventolin HFA)  Amitriptyline (Elavil)  Benzonatate (Tessalon)  Biotin  Caffeine (Fioricet)  Calcium (Tums)  Diclofenac (Voltaren)  Diphenhydramine (Benadryl)  Dupilumab (Dupixent)   Epinephrine  Eye Drop  Famotidine (Pepcid)  Fluticasone (Breo)  Furosemide (Lasix)  Guaifenesin (Robitussin)  Levocetirizine (Xyzal)  Levothyroxine (Synthroid)  Lisinopril (Zestril)  Magnesium (Mag-Ox)  Meclizine (Antivert)  Metformin (Glucophage)  Metoprolol (Lopressor)  Montelukast (Singulair)  Multivitamin  Naloxone (Narcan)  Naproxen (Aleve)  Nitrofurantoin (Macrobid)  Omeprazole (Prilosec)  Pregabalin (Lyrica)  Probiotic  Quetiapine (Seroquel)  Rizatriptan (Maxalt)  Rosuvastatin (Crestor)  Semaglutide (Ozempic)  Spironolactone (Aldactone)  Tizanidine (Zanaflex)  Vilanterol (Breo)  Vitamin B12  Vitamin C  Vitamin D2  Zolpidem (Ambien) ==================================================================== For clinical consultation, please call [phone]. ====================================================================       ROS  Constitutional: Denies any fever or chills Gastrointestinal: No reported hemesis, hematochezia, vomiting, or acute GI distress Musculoskeletal: Denies any acute onset joint swelling, redness, loss of ROM, or weakness Neurological: No reported episodes of acute onset apraxia, aphasia, dysarthria, agnosia, amnesia, paralysis, loss of coordination, or loss of consciousness  Medication Review  B-12, Biotin, EPINEPHrine, Galcanezumab-gnlm, QUEtiapine, Semaglutide(0.25 or 0.5MG/DOS), Tab-A-Vite, acetaminophen, acidophilus, albuterol, amitriptyline, butalbital-acetaminophen-caffeine, calcium carbonate, clonazePAM, diclofenac sodium, diphenhydrAMINE,  ergocalciferol, famotidine, fluticasone furoate-vilanterol, furosemide, glucose blood, levocetirizine, levothyroxine, magnesium oxide, meclizine, metFORMIN, metoprolol tartrate, montelukast, naphazoline-pheniramine, nitrofurantoin (macrocrystal-monohydrate), omeprazole, oxyCODONE, rizatriptan, rosuvastatin, sertraline, tiZANidine, topiramate, and vitamin C  History Review  Allergy: Karen Lewis is  allergic to bactrim [sulfamethoxazole-trimethoprim], omalizumab, ciprofloxacin, shellfish allergy, and nsaids. Drug: Karen Lewis  reports no history of drug use. Alcohol:  reports no history of alcohol use. Tobacco:  reports that she has never smoked. She has never used smokeless tobacco. Social: Karen Lewis  reports that she has never smoked. She has never used smokeless tobacco. She reports that she does not drink alcohol and does not use drugs. Medical:  has a past medical history of Anemia, Anginal pain ([place]), Anxiety, Arthralgia of hip (07/29/2015), Arthritis, Arthritis, degenerative (07/29/2015), Asthma, Cephalalgia (07/25/2014), Dependence on unknown drug ([place]), Depression, Diabetes mellitus without complication ([place]), Difficult intubation, Dysrhythmia, Eczema, Fibromyalgia, Gastritis, GERD (gastroesophageal reflux disease), Gonalgia (07/29/2015), Gout, H/O cardiovascular disorder (03/10/2015), H/O surgical procedure (12/05/2012), H/O thyroid disease (03/10/2015), Headache, Herpes, History of artificial joint (07/29/2015), History of hiatal hernia, Hypertension, Hypomagnesemia, Hypothyroidism, IDA (iron deficiency anemia) (05/28/2019), LBP (low back pain) (07/29/2015), Neuromuscular disorder ([place]), Obesity, PCOS (polycystic ovarian syndrome), Primary osteoarthritis of both knees (07/29/2015), Sleep apnea, Thyroid nodule (bilateral), and Umbilical hernia. Surgical: Karen Lewis  has a past surgical history that includes Laparoscopic partial gastrectomy; Shoulder arthroscopy (Right); Carpal tunnel release (Bilateral); Diagnostic laparoscopy; Cholecystectomy; Trigger finger release (Right); Thyroidectomy (N/A, 11/12/2015); left trigger finger; Roux-en-Y Gastric Bypass (06/03/2017); Hiatal hernia repair; peniculectomy (N/A, 07/05/2018); Total hip arthroplasty (Right, 11/27/2019); Joint replacement (Bilateral, hip); Appendectomy; Trigger finger release (Right, 04/24/2020); Colonoscopy with propofol (N/A, 02/11/2021); and  Esophagogastroduodenoscopy (egd) with propofol (N/A, 02/11/2021). Family: family history includes Alcohol abuse in her father and mother; Anxiety disorder in her father and mother; Brain cancer (age of onset: 68) in her paternal uncle; Breast cancer (age of onset: 67) in her paternal aunt and paternal aunt; COPD in her father; Depression in her brother, father, and mother; Diabetes in her brother, father, and mother; Hypertension in her brother, father, and mother; Kidney cancer in her mother; Kidney failure in her father; Post-traumatic stress disorder in her father; Sleep apnea in her brother, father, and mother.  Laboratory Chemistry Profile   Renal Lab Results  Component Value Date   BUN 17 08/09/2022   CREATININE 1.04 (H) 08/09/2022   GFRAA >60 04/16/2020   GFRNONAA >60 08/09/2022    Hepatic Lab Results  Component Value Date   AST 25 08/07/2022   ALT 40 08/07/2022   ALBUMIN 3.5 08/07/2022   ALKPHOS 74 08/07/2022   LIPASE 37 08/07/2022    Electrolytes Lab Results  Component Value Date   NA 142 08/09/2022   K 4.8 08/09/2022   CL 116 (H) 08/09/2022   CALCIUM 7.8 (L) 08/09/2022   MG 2.5 (H) 08/07/2022   PHOS 5.5 (H) 05/22/2019    Bone Lab Results  Component Value Date   VD25OH 27.4 (L) 11/24/2015   VD125OH2TOT 41.5 11/24/2015    Inflammation (CRP: Acute Phase) (ESR: Chronic Phase) Lab Results  Component Value Date   CRP 0.7 09/27/2019   ESRSEDRATE 30 (H) 09/27/2019   LATICACIDVEN 1.7 08/07/2022         Note: Above Lab results reviewed.  Recent Imaging Review  US Venous Img Lower Unilateral Left (DVT) CLINICAL DATA:  Left calf pain and edema  EXAM: LEFT LOWER EXTREMITY VENOUS DOPPLER ULTRASOUND  TECHNIQUE: Gray-scale sonography with compression, as well as color and duplex ultrasound, were performed to  evaluate the deep venous system(s) from the level of the common femoral vein through the popliteal and proximal calf veins.  COMPARISON:  None  Available.  FINDINGS: VENOUS  Normal compressibility of the common femoral, superficial femoral, and popliteal veins, as well as the visualized calf veins. Visualized portions of profunda femoral vein and great saphenous vein unremarkable. No filling defects to suggest DVT on grayscale or color Doppler imaging. Doppler waveforms show normal direction of venous flow, normal respiratory plasticity and response to augmentation.  Limited views of the contralateral common femoral vein are unremarkable.  OTHER  None.  Limitations: none  IMPRESSION: Negative.  Electronically Signed   By: Jacqulynn Cadet M.D.   On: 08/20/2022 11:03 Note: Reviewed        Physical Exam  General appearance: Well nourished, well developed, and well hydrated. In no apparent acute distress Mental status: Alert, oriented x 3 (person, place, & time)       Respiratory: No evidence of acute respiratory distress Eyes: PERLA Vitals: There were no vitals taken for this visit. BMI: Estimated body mass index is 64.66 kg/m as calculated from the following:   Height as of 08/07/22: _0  (1.6 m).   Weight as of 08/07/22: 365 lb (165.6 kg). Ideal: Patient weight not recorded  Assessment   Diagnosis Status  1. Chronic pain syndrome   2. Chronic knee pain (1ry area of Pain) (Bilateral) (R>L)   3. Chronic low back pain (3ry area of Pain) (Bilateral) (R>L) w/o sciatica   4. Secondary osteoarthritis of multiple sites   5. Pharmacologic therapy   6. Chronic use of opiate for therapeutic purpose   7. Encounter for medication management   8. Encounter for chronic pain management    Controlled Controlled Controlled   Updated Problems: No problems updated.  Plan of Care  Problem-specific:  No problem-specific Assessment & Plan notes found for this encounter.  Karen Lewis has a current medication list which includes the following long-term medication(s): albuterol, diphenhydramine, furosemide,  magnesium oxide, metoprolol tartrate, montelukast, oxycodone, rizatriptan, and rizatriptan.  Pharmacotherapy (Medications Ordered): No orders of the defined types were placed in this encounter.  Orders:  No orders of the defined types were placed in this encounter.  Follow-up plan:   No follow-ups on file.     Interventional Therapies  Risk  Complexity Considerations:   Estimated body mass index is 60.59 kg/m as calculated from the following:   Height as of 06/18/21: _1  (1.626 m).   Weight as of 06/18/21: 353 lb (160.1 kg).     NOTE: NO RFA until BMI less or equal to 35.  (Gastric bypass done on 06/03/2017) Iodine allergy  Contrast dye allergy  Shellfish allergy    Planned  Pending:      Under consideration:   Diagnostic bilateral genicular NB    Completed:   Therapeutic right L2-3 LESI x1 (04/29/2022) (100/100/60/60)  Therapeutic bilateral IA Monovisc knee inj. x1 (05/25/2022)  Therapeutic right trapezius TPI/MNB x1 (05/25/2022)  Therapeutic bilateral IA Zilretta knee inj. x4 (03/25/2022) (100/100/80/80)  Therapeutic bilateral IA Hyalgan knee inj. x16 (06/24/2020) (100/100/60/60)  Palliative/therapeutic bilateral lumbar facet MBB x8 (08/25/2021) (100/100/60/60)  Therapeutic right IA hip injection x3 (07/07/2017) (100/50/25/>50)  Therapeutic left IA shoulder (glenohumeral joint) injection x1 (05/31/2017) (50/0/100/100)    Completed by other providers:   Therapeutic bilateral THR (total hip replacements) (Dr. Rudene Christians) (Right: 11/27/2019) (Left: 07/29/2015)    Therapeutic  Palliative (PRN) options:   Palliative bilateral knee injections  Palliative bilateral lumbar facet MBBs    Pharmacotherapy:  Nonopioid transferred 06/24/2020: Lyrica Recommendations:   None at this time.     Recent Visits Date Type Provider Dept  06/29/22 Office Visit Milinda Pointer, MD [facility]  06/08/22 Office Visit Milinda Pointer, MD [facility]  05/25/22  Procedure visit Milinda Pointer, MD [facility]  05/24/22 Office Visit Milinda Pointer, MD [facility]  Showing recent visits within past 90 days and meeting all other requirements Future Appointments Date Type Provider Dept  08/23/22 Appointment Milinda Pointer, MD [facility]  Showing future appointments within next 90 days and meeting all other requirements  I discussed the assessment and treatment plan with the patient. The patient was provided an opportunity to ask questions and all were answered. The patient agreed with the plan and demonstrated an understanding of the instructions.  Patient advised to call back or seek an in-person evaluation if the symptoms or condition worsens.  Duration of encounter: *** minutes.  Total time on encounter, as per AMA guidelines included both the face-to-face and non-face-to-face time personally spent by the physician and/or other qualified health care professional(s) on the day of the encounter (includes time in activities that require the physician or other qualified health care professional and does not include time in activities normally performed by clinical staff). Physician's time may include the following activities when performed: preparing to see the patient (eg, review of tests, pre-charting review of records) obtaining and/or reviewing separately obtained history performing a medically appropriate examination and/or evaluation counseling and educating the patient/family/caregiver ordering medications, tests, or procedures referring and communicating with other health care professionals (when not separately reported) documenting clinical information in the electronic or other health record independently interpreting results (not separately reported) and communicating results to the patient/ family/caregiver care coordination (not separately reported)  Note by: Gaspar Cola, MD Date: 08/23/2022;  Time: 2:28 PM

## 2022-08-23 ENCOUNTER — Encounter: Payer: Self-pay | Admitting: Pain Medicine

## 2022-08-23 ENCOUNTER — Ambulatory Visit: Payer: Medicare HMO | Attending: Pain Medicine | Admitting: Pain Medicine

## 2022-08-23 VITALS — BP 149/95 | HR 88 | Temp 97.2°F | Ht 63.0 in | Wt 371.0 lb

## 2022-08-23 DIAGNOSIS — M25562 Pain in left knee: Secondary | ICD-10-CM | POA: Insufficient documentation

## 2022-08-23 DIAGNOSIS — M25561 Pain in right knee: Secondary | ICD-10-CM | POA: Insufficient documentation

## 2022-08-23 DIAGNOSIS — G8929 Other chronic pain: Secondary | ICD-10-CM | POA: Diagnosis not present

## 2022-08-23 DIAGNOSIS — M153 Secondary multiple arthritis: Secondary | ICD-10-CM | POA: Diagnosis not present

## 2022-08-23 DIAGNOSIS — M545 Low back pain, unspecified: Secondary | ICD-10-CM | POA: Insufficient documentation

## 2022-08-23 DIAGNOSIS — Z79891 Long term (current) use of opiate analgesic: Secondary | ICD-10-CM | POA: Insufficient documentation

## 2022-08-23 DIAGNOSIS — Z79899 Other long term (current) drug therapy: Secondary | ICD-10-CM | POA: Insufficient documentation

## 2022-08-23 DIAGNOSIS — J45909 Unspecified asthma, uncomplicated: Secondary | ICD-10-CM | POA: Diagnosis not present

## 2022-08-23 DIAGNOSIS — G894 Chronic pain syndrome: Secondary | ICD-10-CM | POA: Insufficient documentation

## 2022-08-23 MED ORDER — OXYCODONE HCL 5 MG PO TABS: 5.0000 mg | ORAL_TABLET | Freq: Three times a day (TID) | ORAL | 0 refills | Status: DC | PRN

## 2022-08-23 MED ORDER — NALOXONE HCL 4 MG/0.1ML NA LIQD: 1.0000 | NASAL | 0 refills | Status: DC | PRN

## 2022-08-23 NOTE — Patient Instructions (Signed)
____________________________________________________________________________________________  National Pain Medication Shortage  The [place] is experiencing worsening drug shortages. These have had a negative widespread effect on patient care and treatment. Not expected to improve any time soon. Predicted to last past 2029.   Drug shortage list (generic names) Oxycodone IR Oxycodone/APAP Oxymorphone IR Hydromorphone Hydrocodone/APAP Morphine  Where is the problem?  Manufacturing and supply level.  Will this shortage affect you?  Only if you take any of the above pain medications.  How? You may be unable to fill your prescription.  Your pharmacist may offer a "partial fill" of your prescription. (Warning: Do not accept partial fills.) Read our Medication Rules and Regulation. Depending on how much medicine you are dependent on, you may experience withdrawals when unable to get the medication.  Recommendations: Consider ending your dependence on opioid pain medications. Ask your pain specialist to assist you with the process. Consider switching to a medication currently not in shortage, such as Buprenorphine. Talk to your pain specialist about this option. Consider decreasing your pain medication requirements by managing tolerance thru "Drug Holidays". This may help minimize withdrawals, should you run out of medicine. Control your pain thru the use of non-pharmacological interventional therapies.   Your prescriber: Prescribers cannot be blamed for shortages. Medication manufacturing and supply issues cannot be fixed by the prescriber.   NOTE: The prescriber is not responsible for supplying the medication, or solving supply issues. Work with your pharmacist to solve it. The patient is responsible for the decision to take or continue taking the medication and for identifying and securing a legal supply source. By law, supplying the medication is the job and responsibility of the  pharmacy. The prescriber is responsible for the evaluation, monitoring, and prescribing of these medications.   Prescribers will NOT: Re-issue prescriptions that have been partially filled. Re-issue prescriptions already sent to a pharmacy.  Re-send prescriptions to a different pharmacy because yours did not have your medication. Ask pharmacist to order the medicine or transfer it from one of their other pharmacies.  New 2023 regulation: "May 14, 2022 Revised Regulation Allows DEA-Registered Pharmacies to Transfer Electronic Prescriptions at a Patient's Request [place] Patients now have the ability to request their electronic prescription be transferred to another pharmacy without having to go back to their practitioner to initiate the request. This revised regulation went into effect on Monday, May 10, 2022.     At a patient's request, a DEA-registered retail pharmacy can now transfer an electronic prescription for a controlled substance (schedules II-V) to another DEA-registered retail pharmacy. Prior to this change, patients would have to go through their practitioner to cancel their prescription and have it re-issued to a different pharmacy. The process was taxing and time consuming for both patients and practitioners.    The Drug Enforcement Administration [facility]) published its intent to revise the process for transferring electronic prescriptions on August 01, 2020.  The final rule was published in the federal register on April 08, 2022 and went into effect 30 days later.  Under the final rule, a prescription can only be transferred once between pharmacies, and only if allowed under existing state or other applicable law. The prescription must remain in its electronic form; may not be altered in any way; and the transfer must be communicated directly between two licensed pharmacists. It's important to note, any authorized refills transfer  with the original prescription, which means the entire prescription will be filled at the same pharmacy".  Reference: [employer].at (  DEA website announcement)  [email or web address].pdf ([employer] [place])   [employer] / Vol. 88, No. 143 / Thursday, April 08, 2022 / Rules and Regulations DEPARTMENT OF JUSTICE  Drug Enforcement Administration  21 CFR Part 1306  [Docket No. [id number]]  RIN [id number] Transfer of Electronic Prescriptions for Schedules II-V Controlled Substances Between Pharmacies for Initial Filling  ____________________________________________________________________________________________   ____________________________________________________________________________________________  Patient Information update  To: All of our patients.  Re: Name change.  It has been made official that our current name, "[facility]"   will soon be changed to "[facility]".   The purpose of this change is to eliminate any confusion created by the concept of our practice being a "Medication Management Pain Clinic". In the past this has led to the misconception that we treat pain primarily by the use of prescription medications.  Nothing can be farther from the truth.   Understanding PAIN MANAGEMENT: To further understand what our practice does, you first have to understand that "Pain Management" is a subspecialty that requires additional training once a physician has completed their specialty training, which can be in either Anesthesia, Neurology, Psychiatry, or Physical Medicine and Rehabilitation (PMR). Each one of these contributes to the final approach taken by each physician to the management of their patient's  pain. To be a "Pain Management Specialist" you must have first completed one of the specialty trainings below.  Anesthesiologists - trained in clinical pharmacology and interventional techniques such as nerve blockade and regional as well as central neuroanatomy. They are trained to block pain before, during, and after surgical interventions.  Neurologists - trained in the diagnosis and pharmacological treatment of complex neurological conditions, such as Multiple Sclerosis, Parkinson's, spinal cord injuries, and other systemic conditions that may be associated with symptoms that may include but are not limited to pain. They tend to rely primarily on the treatment of chronic pain using prescription medications.  Psychiatrist - trained in conditions affecting the psychosocial wellbeing of patients including but not limited to depression, anxiety, schizophrenia, personality disorders, addiction, and other substance use disorders that may be associated with chronic pain. They tend to rely primarily on the treatment of chronic pain using prescription medications.   Physical Medicine and Rehabilitation (PMR) physicians, also known as physiatrists - trained to treat a wide variety of medical conditions affecting the brain, spinal cord, nerves, bones, joints, ligaments, muscles, and tendons. Their training is primarily aimed at treating patients that have suffered injuries that have caused severe physical impairment. Their training is primarily aimed at the physical therapy and rehabilitation of those patients. They may also work alongside orthopedic surgeons or neurosurgeons using their expertise in assisting surgical patients to recover after their surgeries.  INTERVENTIONAL PAIN MANAGEMENT is sub-subspecialty of Pain Management.  Our physicians are Board-certified in Anesthesia, Pain Management, and Interventional Pain Management.  This meaning that not only have they been trained and Board-certified in their  specialty of Anesthesia, and subspecialty of Pain Management, but they have also received further training in the sub-subspecialty of Interventional Pain Management, in order to become Board-certified as INTERVENTIONAL PAIN MANAGEMENT SPECIALIST.    Mission: Our goal is to use our skills in  [place] as alternatives to the chronic use of prescription opioid medications for the treatment of pain. To make this more clear, we have changed our name to reflect what we do and offer. We will continue to offer  medication management assessment and recommendations, but we will not be taking over any patient's medication management.  ____________________________________________________________________________________________     _______________________________________________________________________  Medication Rules  Purpose: To inform patients, and their family members, of our medication rules and regulations.  Applies to: All patients receiving prescriptions from our practice (written or electronic).  Pharmacy of record: This is the pharmacy where your electronic prescriptions will be sent. Make sure we have the correct one.  Electronic prescriptions: In compliance with the [place] (STOP) Act of 2017 (Session Lanny Cramp [phone]), effective September 13, 2018, all controlled substances must be electronically prescribed. Written prescriptions, faxing, or calling prescriptions to a pharmacy will no longer be done.  Prescription refills: These will be provided only during in-person appointments. No medications will be renewed without a "face-to-face" evaluation with your provider. Applies to all prescriptions.  NOTE: The following applies primarily to controlled substances (Opioid* Pain Medications).   Type of encounter (visit): For patients receiving controlled substances, face-to-face visits are required. (Not an option and not up to the  patient.)  Patient's responsibilities: Pain Pills: Bring all pain pills to every appointment (except for procedure appointments). Pill Bottles: Bring pills in original pharmacy bottle. Bring bottle, even if empty. Always bring the bottle of the most recent fill.  Medication refills: You are responsible for knowing and keeping track of what medications you are taking and when is it that you will need a refill. The day before your appointment: write a list of all prescriptions that need to be refilled. The day of the appointment: give the list to the admitting nurse. Prescriptions will be written only during appointments. No prescriptions will be written on procedure days. If you forget a medication: it will not be "Called in", "Faxed", or "electronically sent". You will need to get another appointment to get these prescribed. No early refills. Do not call asking to have your prescription filled early. Partial  or short prescriptions: Occasionally your pharmacy may not have enough pills to fill your prescription.  NEVER ACCEPT a partial fill or a prescription that is short of the total amount of pills that you were prescribed.  With controlled substances the law allows 72 hours for the pharmacy to complete the prescription.  If the prescription is not completed within 72 hours, the pharmacist will require a new prescription to be written. This means that you will be short on your medicine and we WILL NOT send another prescription to complete your original prescription.  Instead, request the pharmacy to send a carrier to a nearby branch to get enough medication to provide you with your full prescription. Prescription Accuracy: You are responsible for carefully inspecting your prescriptions before leaving our office. Have the discharge nurse carefully go over each prescription with you, before taking them home. Make sure that your name is accurately spelled, that your address is correct. Check the name and  dose of your medication to make sure it is accurate. Check the number of pills, and the written instructions to make sure they are clear and accurate. Make sure that you are given enough medication to last until your next medication refill appointment. Taking Medication: Take medication as prescribed. When it comes to controlled substances, taking less pills or less frequently than prescribed is permitted and encouraged. Never take more pills than instructed. Never take the medication more frequently than prescribed.  Inform other Doctors: Always inform, all of your healthcare providers, of all the medications you take. Pain Medication  from other Providers: You are not allowed to accept any additional pain medication from any other Doctor or Healthcare provider. There are two exceptions to this rule. (see below) In the event that you require additional pain medication, you are responsible for notifying us, as stated below. Cough Medicine: Often these contain an opioid, such as codeine or hydrocodone. Never accept or take cough medicine containing these opioids if you are already taking an opioid* medication. The combination may cause respiratory failure and death. Medication Agreement: You are responsible for carefully reading and following our Medication Agreement. This must be signed before receiving any prescriptions from our practice. Safely store a copy of your signed Agreement. Violations to the Agreement will result in no further prescriptions. (Additional copies of our Medication Agreement are available upon request.) Laws, Rules, & Regulations: All patients are expected to follow all Federal and [employer], Rules, [employer]. Ignorance of the Laws does not constitute a valid excuse.  Illegal drugs and Controlled Substances: The use of illegal substances (including, but not limited to marijuana and its derivatives) and/or the illegal use of any controlled substances is strictly  prohibited. Violation of this rule may result in the immediate and permanent discontinuation of any and all prescriptions being written by our practice. The use of any illegal substances is prohibited. Adopted CDC guidelines & recommendations: Target dosing levels will be at or below 60 MME/day. Use of benzodiazepines** is not recommended.  Exceptions: There are only two exceptions to the rule of not receiving pain medications from other Healthcare Providers. Exception #1 (Emergencies): In the event of an emergency (i.e.: accident requiring emergency care), you are allowed to receive additional pain medication. However, you are responsible for: As soon as you are able, call our office [phone], at any time of the day or night, and leave a message stating your name, the date and nature of the emergency, and the name and dose of the medication prescribed. In the event that your call is answered by a member of our staff, make sure to document and save the date, time, and the name of the person that took your information.  Exception #2 (Planned Surgery): In the event that you are scheduled by another doctor or dentist to have any type of surgery or procedure, you are allowed (for a period no longer than 30 days), to receive additional pain medication, for the acute post-op pain. However, in this case, you are responsible for picking up a copy of our "Post-op Pain Management for Surgeons" handout, and giving it to your surgeon or dentist. This document is available at our office, and does not require an appointment to obtain it. Simply go to our office during business hours (Monday-Thursday from 8:00 AM to 4:00 PM) (Friday 8:00 AM to 12:00 Noon) or if you have a scheduled appointment with [place], prior to your surgery, and ask for it by name. In addition, you are responsible for: calling our office [phone], at any time of the day or night, and leaving a message stating your name, name of your surgeon, type  of surgery, and date of procedure or surgery. Failure to comply with your responsibilities may result in termination of therapy involving the controlled substances. Medication Agreement Violation. Following the above rules, including your responsibilities will help you in avoiding a Medication Agreement Violation ("Breaking your Pain Medication Contract").  Consequences:  Not following the above rules may result in permanent discontinuation of medication prescription therapy.  *Opioid medications include:  morphine, codeine, oxycodone, oxymorphone, hydrocodone, hydromorphone, meperidine, tramadol, tapentadol, buprenorphine, fentanyl, methadone. **Benzodiazepine medications include: diazepam (Valium), alprazolam (Xanax), clonazepam (Klonopine), lorazepam (Ativan), clorazepate (Tranxene), chlordiazepoxide (Librium), estazolam (Prosom), oxazepam (Serax), temazepam (Restoril), triazolam (Halcion) (Last updated: 07/06/2022) ______________________________________________________________________    ______________________________________________________________________  Medication Recommendations and Reminders  Applies to: All patients receiving prescriptions (written and/or electronic).  Medication Rules & Regulations: You are responsible for reading, knowing, and following our "Medication Rules" document. These exist for your safety and that of others. They are not flexible and neither are we. Dismissing or ignoring them is an act of "non-compliance" that may result in complete and irreversible termination of such medication therapy. For safety reasons, "non-compliance" will not be tolerated. As with the U.S[place] fundamental legal principle of "ignorance of the law is no defense", we will accept no excuses for not having read and knowing the content of documents provided to you by our practice.  Pharmacy of record:  Definition: This is the pharmacy where your electronic prescriptions will be sent.  We do  not endorse any particular pharmacy. It is up to you and your insurance to decide what pharmacy to use.  We do not restrict you in your choice of pharmacy. However, once we write for your prescriptions, we will NOT be re-sending more prescriptions to fix restricted supply problems created by your pharmacy, or your insurance.  The pharmacy listed in the electronic medical record should be the one where you want electronic prescriptions to be sent. If you choose to change pharmacy, simply notify our nursing staff. Changes will be made only during your regular appointments and not over the phone.  Recommendations: Keep all of your pain medications in a safe place, under lock and key, even if you live alone. We will NOT replace lost, stolen, or damaged medication. We do not accept "Police Reports" as proof of medications having been stolen. After you fill your prescription, take 1 week's worth of pills and put them away in a safe place. You should keep a separate, properly labeled bottle for this purpose. The remainder should be kept in the original bottle. Use this as your primary supply, until it runs out. Once it's gone, then you know that you have 1 week's worth of medicine, and it is time to come in for a prescription refill. If you do this correctly, it is unlikely that you will ever run out of medicine. To make sure that the above recommendation works, it is very important that you make sure your medication refill appointments are scheduled at least 1 week before you run out of medicine. To do this in an effective manner, make sure that you do not leave the office without scheduling your next medication management appointment. Always ask the nursing staff to show you in your prescription , when your medication will be running out. Then arrange for the receptionist to get you a return appointment, at least 7 days before you run out of medicine. Do not wait until you have 1 or 2 pills left, to come in. This  is very poor planning and does not take into consideration that we may need to cancel appointments due to bad weather, sickness, or emergencies affecting our staff. DO NOT ACCEPT A "Partial Fill": If for any reason your pharmacy does not have enough pills/tablets to completely fill or refill your prescription, do not allow for a "partial fill". The law allows the pharmacy to complete that prescription within 72 hours, without requiring a new prescription. If  they do not fill the rest of your prescription within those 72 hours, you will need a separate prescription to fill the remaining amount, which we will NOT provide. If the reason for the partial fill is your insurance, you will need to talk to the pharmacist about payment alternatives for the remaining tablets, but again, DO NOT ACCEPT A PARTIAL FILL, unless you can trust your pharmacist to obtain the remainder of the pills within 72 hours.  Prescription refills and/or changes in medication(s):  Prescription refills, and/or changes in dose or medication, will be conducted only during scheduled medication management appointments. (Applies to both, written and electronic prescriptions.) No refills on procedure days. No medication will be changed or started on procedure days. No changes, adjustments, and/or refills will be conducted on a procedure day. Doing so will interfere with the diagnostic portion of the procedure. No phone refills. No medications will be "called into the pharmacy". No Fax refills. No weekend refills. No Holliday refills. No after hours refills.  Remember:  Business hours are:  Monday to Thursday 8:00 AM to 4:00 PM Provider's Schedule: Milinda Pointer, MD - Appointments are:  Medication management: Monday and Wednesday 8:00 AM to 4:00 PM Procedure day: Tuesday and Thursday 7:30 AM to 4:00 PM Gillis Santa, MD - Appointments are:  Medication management: Tuesday and Thursday 8:00 AM to 4:00 PM Procedure day: Monday and  Wednesday 7:30 AM to 4:00 PM (Last update: 07/06/2022) ______________________________________________________________________    ____________________________________________________________________________________________  Drug Holidays  What is a "Drug Holiday"? Drug Holiday: is the name given to the process of slowly tapering down and temporarily stopping the pain medication for the purpose of decreasing or eliminating tolerance to the drug.  Benefits Improved effectiveness Decreased required effective dose Improved pain control End dependence on high dose therapy Decrease cost of therapy Uncovering "opioid-induced hyperalgesia". (OIH)  What is "opioid hyperalgesia"? It is a paradoxical increase in pain caused by exposure to opioids. Stopping the opioid pain medication, contrary to the expected, it actually decreases or completely eliminates the pain. Ref.: "A comprehensive review of opioid-induced hyperalgesia". Brion Aliment, et.al. Pain Physician. 2011 Mar-Apr;14(2):145-61.  What is tolerance? Tolerance: the progressive loss of effectiveness of a pain medicine due to repetitive use. A common problem of opioid pain medications.  How long should a "Drug Holiday" last? Effectiveness depends on the patient staying off all opioid pain medicines for a minimum of 14 consecutive days. (2 weeks)  How about just taking less of the medicine? Does not work. Will not accomplish goal of eliminating the excess receptors.  How about switching to a different pain medicine? (AKA. "Opioid rotation") Does not work. Creates the illusion of effectiveness by taking advantage of inaccurate equivalent dose calculations between different opioids. -This "technique" was promoted by studies funded by [employer], such as [employer], creators of "OxyContin".  Can I stop the medicine "cold [place]"? Depends. You should always coordinate with your Pain Specialist to make the transition as  smoothly as possible. Avoid stopping the medicine abruptly without consulting. We recommend a "slow taper".  What is a slow taper? Taper: refers to the gradual decrease in dose.   How do I stop/taper the dose? Slowly. Decrease the daily amount of pills that you take by one (1) pill every seven (7) days. This is called a "slow downward taper". Example: if you normally take four (4) pills per day, drop it to three (3) pills per day for seven (7) days, then to two (2) pills per day  for seven (7) days, then to one (1) per day for seven (7) days, and then stop the medicine. The 14 day "Drug Holiday" starts on the first day without medicine.   Will I experience withdrawals? Unlikely with a slow taper.  What triggers withdrawals? Withdrawals are triggered by the sudden/abrupt stop of high dose opioids. Withdrawals can be avoided by slowly decreasing the dose over a prolonged period of time.  What are withdrawals? Symptoms associated with sudden/abrupt reduction/stopping of high-dose, long-term use of pain medication. Withdrawal are seldom seen on low dose therapy, or patients rarely taking opioid medication.  Early Withdrawal Symptoms may include: Agitation Anxiety Muscle aches Increased tearing Insomnia Runny nose Sweating Yawning  Late symptoms may include: Abdominal cramping Diarrhea Dilated pupils Goose bumps Nausea Vomiting  (Last update: 08/22/2022) ____________________________________________________________________________________________    ____________________________________________________________________________________________  CBD (cannabidiol) & Delta-8 (Delta-8 tetrahydrocannabinol) WARNING  Intro: Cannabidiol (CBD) and tetrahydrocannabinol (THC), are two natural compounds found in plants of the Cannabis genus. They can both be extracted from hemp or cannabis. Hemp and cannabis come from the Cannabis sativa plant. Both compounds interact with your body's  endocannabinoid system, but they have very different effects. CBD does not produce the high sensation associated with cannabis. Delta-8 tetrahydrocannabinol, also known as delta-8 THC, is a psychoactive substance found in the Cannabis sativa plant, of which marijuana and hemp are two varieties. THC is responsible for the high associated with the illicit use of marijuana.  Applicable to: All individuals currently taking or considering taking CBD (cannabidiol) and, more important, all patients taking opioid analgesic controlled substances (pain medication). (Example: oxycodone; oxymorphone; hydrocodone; hydromorphone; morphine; methadone; tramadol; tapentadol; fentanyl; buprenorphine; butorphanol; dextromethorphan; meperidine; codeine; etc.)  Legal status: CBD remains a Schedule I drug prohibited for any use. CBD is illegal with one exception. In the [place], CBD has a limited Transport planner (FDA) approval for the treatment of two specific types of epilepsy disorders. Only one CBD product has been approved by the FDA for this purpose: "Epidiolex". FDA is aware that some companies are marketing products containing cannabis and cannabis-derived compounds in ways that violate the [employer], Drug and Cosmetic Act [facility] [place] [facility] Act) and that may put the health and safety of consumers at risk. The FDA, a Federal agency, has not enforced the CBD status since 2018. UPDATE: (10/30/2021) The Drug Enforcement Agency ([place]) issued a letter stating that "delta" cannabinoids, including Delta-8-THCO and Delta-9-THCO, synthetically derived from hemp do not qualify as hemp and will be viewed as Schedule I drugs. (Schedule I drugs, substances, or chemicals are defined as drugs with no currently accepted medical use and a high potential for abuse. Some examples of Schedule I drugs are: heroin, lysergic acid diethylamide (LSD), marijuana (cannabis), 3,4-methylenedioxymethamphetamine (ecstasy), methaqualone, and peyote.)  (https://jennings.com/)  Legality: Some manufacturers ship CBD products nationally, which is illegal. Often such products are sold online and are therefore available throughout the country. CBD is openly sold in head shops and health food stores in some states where such sales have not been explicitly legalized. Selling unapproved products with unsubstantiated therapeutic claims is not only a violation of the law, but also can put patients at risk, as these products have not been proven to be safe or effective. Federal illegality makes it difficult to conduct research on CBD.  Reference: "FDA Regulation of Cannabis and Cannabis-Derived Products, Including Cannabidiol (CBD)" - [employer]ekArtists.[employer].pt  Warning: CBD is not FDA approved and has not undergo the same manufacturing controls as prescription drugs.  This  means that the purity and safety of available CBD may be questionable. Most of the time, despite manufacturer's claims, it is contaminated with THC (delta-9-tetrahydrocannabinol - the chemical in marijuana responsible for the "HIGH").  When this is the case, the [facility] contaminant will trigger a positive urine drug screen (UDS) test for Marijuana (carboxy-THC). Because a positive UDS for any illicit substance is a violation of our medication agreement, your opioid analgesics (pain medicine) may be permanently discontinued. The FDA recently put out a warning about 5 things that everyone should be aware of regarding Delta-8 THC: Delta-8 THC products have not been evaluated or approved by the FDA for safe use and may be marketed in ways that put the public health at risk. The FDA has received adverse event reports involving delta-8 THC-containing products. Delta-8 THC has psychoactive and intoxicating effects. Delta-8 THC manufacturing often involve use of potentially harmful chemicals to create the  concentrations of delta-8 THC claimed in the marketplace. The final delta-8 THC product may have potentially harmful by-products (contaminants) due to the chemicals used in the process. Manufacturing of delta-8 THC products may occur in uncontrolled or unsanitary settings, which may lead to the presence of unsafe contaminants or other potentially harmful substances. Delta-8 THC products should be kept out of the reach of children and pets.  MORE ABOUT CBD  General Information: CBD was discovered in 3 and it is a derivative of the cannabis sativa genus plants (Marijuana and Hemp). It is one of the 113 identified substances found in Marijuana. It accounts for up to 40% of the plant's extract. As of 2018, preliminary clinical studies on CBD included research for the treatment of anxiety, movement disorders, and pain. CBD is available and consumed in multiple forms, including inhalation of smoke or vapor, as an aerosol spray, and by mouth. It may be supplied as an oil containing CBD, capsules, dried cannabis, or as a liquid solution. CBD is thought not to be as psychoactive as THC (delta-9-tetrahydrocannabinol - the chemical in marijuana responsible for the "HIGH"). Studies suggest that CBD may interact with different biological target receptors in the body, including cannabinoid and other neurotransmitter receptors. As of 2018 the mechanism of action for its biological effects has not been determined.  Side-effects  Adverse reactions: Dry mouth, diarrhea, decreased appetite, fatigue, drowsiness, malaise, weakness, sleep disturbances, and others.  Drug interactions: CBC may interact with other medications such as blood-thinners. Because CBD causes drowsiness on its own, it also increases the drowsiness caused by other medications, including antihistamines (such as Benadryl), benzodiazepines (Xanax, Ativan, Valium), antipsychotics, antidepressants and opioids, as well as alcohol and supplements such as kava,  melatonin and St. John's Wort. Be cautious with the following combinations:   Brivaracetam (Briviact) Brivaracetam is changed and broken down by the body. CBD might decrease how quickly the body breaks down brivaracetam. This might increase levels of brivaracetam in the body.  Caffeine Caffeine is changed and broken down by the body. CBD might decrease how quickly the body breaks down caffeine. This might increase levels of caffeine in the body.  Carbamazepine (Tegretol) Carbamazepine is changed and broken down by the body. CBD might decrease how quickly the body breaks down carbamazepine. This might increase levels of carbamazepine in the body and increase its side effects.  Citalopram (Celexa) Citalopram is changed and broken down by the body. CBD might decrease how quickly the body breaks down citalopram. This might increase levels of citalopram in the body and increase its side effects.  Clobazam (  Onfi) Clobazam is changed and broken down by the liver. CBD might decrease how quickly the liver breaks down clobazam. This might increase the effects and side effects of clobazam.  Eslicarbazepine (Aptiom) Eslicarbazepine is changed and broken down by the body. CBD might decrease how quickly the body breaks down eslicarbazepine. This might increase levels of eslicarbazepine in the body by a small amount.  Everolimus (Zostress) Everolimus is changed and broken down by the body. CBD might decrease how quickly the body breaks down everolimus. This might increase levels of everolimus in the body.  Lithium Taking higher doses of CBD might increase levels of lithium. This can increase the risk of lithium toxicity.  Medications changed by the liver (Cytochrome P450 1A1 (CYP1A1) substrates) Some medications are changed and broken down by the liver. CBD might change how quickly the liver breaks down these medications. This could change the effects and side effects of these medications.  Medications  changed by the liver (Cytochrome P450 1A2 (CYP1A2) substrates) Some medications are changed and broken down by the liver. CBD might change how quickly the liver breaks down these medications. This could change the effects and side effects of these medications.  Medications changed by the liver (Cytochrome P450 1B1 (CYP1B1) substrates) Some medications are changed and broken down by the liver. CBD might change how quickly the liver breaks down these medications. This could change the effects and side effects of these medications.  Medications changed by the liver (Cytochrome P450 2A6 (CYP2A6) substrates) Some medications are changed and broken down by the liver. CBD might change how quickly the liver breaks down these medications. This could change the effects and side effects of these medications.  Medications changed by the liver (Cytochrome P450 2B6 (CYP2B6) substrates) Some medications are changed and broken down by the liver. CBD might change how quickly the liver breaks down these medications. This could change the effects and side effects of these medications.  Medications changed by the liver (Cytochrome P450 2C19 (CYP2C19) substrates) Some medications are changed and broken down by the liver. CBD might change how quickly the liver breaks down these medications. This could change the effects and side effects of these medications.  Medications changed by the liver (Cytochrome P450 2C8 (CYP2C8) substrates) Some medications are changed and broken down by the liver. CBD might change how quickly the liver breaks down these medications. This could change the effects and side effects of these medications.  Medications changed by the liver (Cytochrome P450 2C9 (CYP2C9) substrates) Some medications are changed and broken down by the liver. CBD might change how quickly the liver breaks down these medications. This could change the effects and side effects of these medications.  Medications changed  by the liver (Cytochrome P450 2D6 (CYP2D6) substrates) Some medications are changed and broken down by the liver. CBD might change how quickly the liver breaks down these medications. This could change the effects and side effects of these medications.  Medications changed by the liver (Cytochrome P450 2E1 (CYP2E1) substrates) Some medications are changed and broken down by the liver. CBD might change how quickly the liver breaks down these medications. This could change the effects and side effects of these medications.  Medications changed by the liver (Cytochrome P450 3A4 (CYP3A4) substrates) Some medications are changed and broken down by the liver. CBD might change how quickly the liver breaks down these medications. This could change the effects and side effects of these medications.  Medications changed by the liver (Glucuronidated drugs) Some  medications are changed and broken down by the liver. CBD might change how quickly the liver breaks down these medications. This could change the effects and side effects of these medications.  Medications that decrease the breakdown of other medications by the liver (Cytochrome P450 2C19 (CYP2C19) inhibitors) CBD is changed and broken down by the liver. Some drugs decrease how quickly the liver changes and breaks down CBD. This could change the effects and side effects of CBD.  Medications that decrease the breakdown of other medications in the liver (Cytochrome P450 3A4 (CYP3A4) inhibitors) CBD is changed and broken down by the liver. Some drugs decrease how quickly the liver changes and breaks down CBD. This could change the effects and side effects of CBD.  Medications that increase breakdown of other medications by the liver (Cytochrome P450 3A4 (CYP3A4) inducers) CBD is changed and broken down by the liver. Some drugs increase how quickly the liver changes and breaks down CBD. This could change the effects and side effects of CBD.  Medications  that increase the breakdown of other medications by the liver (Cytochrome P450 2C19 (CYP2C19) inducers) CBD is changed and broken down by the liver. Some drugs increase how quickly the liver changes and breaks down CBD. This could change the effects and side effects of CBD.  Methadone (Dolophine) Methadone is broken down by the liver. CBD might decrease how quickly the liver breaks down methadone. Taking cannabidiol along with methadone might increase the effects and side effects of methadone.  Rufinamide (Banzel) Rufinamide is changed and broken down by the body. CBD might decrease how quickly the body breaks down rufinamide. This might increase levels of rufinamide in the body by a small amount.  Sedative medications (CNS depressants) CBD might cause sleepiness and slowed breathing. Some medications, called sedatives, can also cause sleepiness and slowed breathing. Taking CBD with sedative medications might cause breathing problems and/or too much sleepiness.  Sirolimus (Rapamune) Sirolimus is changed and broken down by the body. CBD might decrease how quickly the body breaks down sirolimus. This might increase levels of sirolimus in the body.  Stiripentol (Diacomit) Stiripentol is changed and broken down by the body. CBD might decrease how quickly the body breaks down stiripentol. This might increase levels of stiripentol in the body and increase its side effects.  Tacrolimus (Prograf) Tacrolimus is changed and broken down by the body. CBD might decrease how quickly the body breaks down tacrolimus. This might increase levels of tacrolimus in the body.  Tamoxifen (Soltamox) Tamoxifen is changed and broken down by the body. CBD might affect how quickly the body breaks down tamoxifen. This might affect levels of tamoxifen in the body.  Topiramate (Topamax) Topiramate is changed and broken down by the body. CBD might decrease how quickly the body breaks down topiramate. This might increase  levels of topiramate in the body by a small amount.  Valproate Valproic acid can cause liver injury. Taking cannabidiol with valproic acid might increase the chance of liver injury. CBD and/or valproic acid might need to be stopped, or the dose might need to be reduced.  Warfarin (Coumadin) CBD might increase levels of warfarin, which can increase the risk for bleeding. CBD and/or warfarin might need to be stopped, or the dose might need to be reduced.  Zonisamide Zonisamide is changed and broken down by the body. CBD might decrease how quickly the body breaks down zonisamide. This might increase levels of zonisamide in the body by a small amount. (Last update: 11/11/2021) ____________________________________________________________________________________________  ____________________________________________________________________________________________  Naloxone Nasal Spray  Why am I receiving this medication? [place] STOP ACT requires that all patients taking high dose opioids or at risk of opioids respiratory depression, be prescribed an opioid reversal agent, such as Naloxone (AKA: Narcan).  What is this medication? NALOXONE (nal OX one) treats opioid overdose, which causes slow or shallow breathing, severe drowsiness, or trouble staying awake. Call emergency services after using this medication. You may need additional treatment. Naloxone works by reversing the effects of opioids. It belongs to a group of medications called opioid blockers.  COMMON BRAND NAME(S): Kloxxado, Narcan  What should I tell my care team before I take this medication? They need to know if you have any of these conditions: Heart disease Substance use disorder An unusual or allergic reaction to naloxone, other medications, foods, dyes, or preservatives Pregnant or trying to get pregnant Breast-feeding  When to use this medication? This medication is to be used for the treatment of respiratory  depression (less than 8 breaths per minute) secondary to opioid overdose.   How to use this medication? This medication is for use in the nose. Lay the person on their back. Support their neck with your hand and allow the head to tilt back before giving the medication. The nasal spray should be given into 1 nostril. After giving the medication, move the person onto their side. Do not remove or test the nasal spray until ready to use. Get emergency medical help right away after giving the first dose of this medication, even if the person wakes up. You should be familiar with how to recognize the signs and symptoms of a narcotic overdose. If more doses are needed, give the additional dose in the other nostril. Talk to your care team about the use of this medication in children. While this medication may be prescribed for children as young as newborns for selected conditions, precautions do apply.  Naloxone Overdosage: If you think you have taken too much of this medicine contact a poison control center or emergency room at once.  NOTE: This medicine is only for you. Do not share this medicine with others.  What if I miss a dose? This does not apply.  What may interact with this medication? This is only used during an emergency. No interactions are expected during emergency use. This list may not describe all possible interactions. Give your health care provider a list of all the medicines, herbs, non-prescription drugs, or dietary supplements you use. Also tell them if you smoke, drink alcohol, or use illegal drugs. Some items may interact with your medicine.  What should I watch for while using this medication? Keep this medication ready for use in the case of an opioid overdose. Make sure that you have the phone number of your care team and local hospital ready. You may need to have additional doses of this medication. Each nasal spray contains a single dose. Some emergencies may require  additional doses. After use, bring the treated person to the nearest hospital or call 911. Make sure the treating care team knows that the person has received a dose of this medication. You will receive additional instructions on what to do during and after use of this medication before an emergency occurs.  What side effects may I notice from receiving this medication? Side effects that you should report to your care team as soon as possible: Allergic reactions--skin rash, itching, hives, swelling of the face, lips, tongue, or throat Side effects that  usually do not require medical attention (report these to your care team if they continue or are bothersome): Constipation Dryness or irritation inside the nose Headache Increase in blood pressure Muscle spasms Stuffy nose Toothache This list may not describe all possible side effects. Call your doctor for medical advice about side effects. You may report side effects to FDA at 1-800-FDA-1088.  Where should I keep my medication? Because this is an emergency medication, you should keep it with you at all times.  Keep out of the reach of children and pets. Store between 20 and 25 degrees C (68 and 77 degrees F). Do not freeze. Throw away any unused medication after the expiration date. Keep in original box until ready to use.  NOTE: This sheet is a summary. It may not cover all possible information. If you have questions about this medicine, talk to your doctor, pharmacist, or health care provider.   2023 Elsevier/Gold Standard (2021-05-08 00:00:00)  ____________________________________________________________________________________________    _____________________________________________________________________________________________  Body mass index (BMI)  Body mass index (BMI) is a common tool for deciding whether a person has an appropriate body weight.  It measures a persons weight in relation to their height.   According to the  [facility] of health (NIH): A BMI of less than 18.5 means that a person is underweight. A BMI of between 18.5 and 24.9 is ideal. A BMI of between 25 and 29.9 is overweight. A BMI over 30 indicates obesity.  Weight Management Required  URGENT: Your weight has been found to be adversely affecting your health.  Dear Ms. Aki:  Your current Estimated body mass index is 64.66 kg/m as calculated from the following:   Height as of 08/07/22: _0  (1.6 m).   Weight as of 08/07/22: 365 lb (165.6 kg).  Please use the table below to identify your weight category and associated incidence of chronic pain, secondary to your weight.  Body Mass Index (BMI) Classification BMI level (kg/m2) Category Associated incidence of chronic pain  <18  Underweight   18.5-24.9 Ideal body weight   25-29.9 Overweight  20%  30-34.9 Obese (Class I)  68%  35-39.9 Severe obesity (Class II)  136%  >40 Extreme obesity (Class III)  254%   In addition: You will be considered "Morbidly Obese", if your BMI is above 30 and you have one or more of the following conditions which are known to be caused and/or directly associated with obesity: 1.    Type 2 Diabetes (Which in turn can lead to cardiovascular diseases (CVD), stroke, peripheral vascular diseases (PVD), retinopathy, nephropathy, and neuropathy) 2.    Cardiovascular Disease (High Blood Pressure; Congestive Heart Failure; High Cholesterol; Coronary Artery Disease; Angina; or History of Heart Attacks) 3.    Breathing problems (Asthma; obesity-hypoventilation syndrome; obstructive sleep apnea; chronic inflammatory airway disease; reactive airway disease; or shortness of breath) 4.    Chronic kidney disease 5.    Liver disease (nonalcoholic fatty liver disease) 6.    High blood pressure 7.    Acid reflux (gastroesophageal reflux disease; heartburn) 8.    Osteoarthritis (OA) (with any of the following: hip pain; knee pain; and/or low back pain) 9.    Low back  pain (Lumbar Facet Syndrome; and/or Degenerative Disc Disease) 10.  Hip pain (Osteoarthritis of hip) (For every 1 lbs of added body weight, there is a 2 lbs increase in pressure inside of each hip articulation. 1:2 mechanical relationship) 11.  Knee pain (Osteoarthritis of knee) (For every 1 lbs  of added body weight, there is a 4 lbs increase in pressure inside of each knee articulation. 1:4 mechanical relationship) (patients with a BMI>30 kg/m2 were 6.8 times more likely to develop knee OA than normal-weight individuals) 12.  Cancer: Epidemiological studies have shown that obesity is a risk factor for: post-menopausal breast cancer; cancers of the endometrium, colon and kidney cancer; malignant adenomas of the oesophagus. Obese subjects have an approximately 1.5-3.5-fold increased risk of developing these cancers compared with normal-weight subjects, and it has been estimated that between 15 and 45% of these cancers can be attributed to overweight. More recent studies suggest that obesity may also increase the risk of other types of cancer, including pancreatic, hepatic and gallbladder cancer. (Ref: Obesity and cancer. Pischon T, Nthlings U, [employer] H. Proc Nutr Soc. 2008 May;67(2):128-45. doi: 16.1096/[id number].) The International Agency for Research on Cancer (IARC) has identified 13 cancers associated with overweight and obesity: meningioma, multiple myeloma, adenocarcinoma of the esophagus, and cancers of the thyroid, postmenopausal breast cancer, gallbladder, stomach, liver, pancreas, kidney, ovaries, uterus, colon and rectal (colorectal) cancers. 32 percent of all cancers diagnosed in women and 24 percent of those diagnosed in men are associated with overweight and obesity.  Recommendation: At this point it is urgent that you take a step back and concentrate in loosing weight. Dedicate 100% of your efforts on this task. Nothing else will improve your health more than bringing your weight down and  your BMI to less than 30. If you are here, you probably have chronic pain. We know that most chronic pain patients have difficulty exercising secondary to their pain. For this reason, you must rely on proper nutrition and diet in order to lose the weight. If your BMI is above 40, you should seriously consider bariatric surgery. A realistic goal is to lose 10% of your body weight over a period of 12 months.  Be honest to yourself, if over time you have unsuccessfully tried to lose weight, then it is time for you to seek professional help and to enter a medically supervised weight management program, and/or undergo bariatric surgery. Stop procrastinating.   Pain management considerations:  1.    Pharmacological Problems: Be advised that the use of opioid analgesics (oxycodone; hydrocodone; morphine; methadone; codeine; and all of their derivatives) have been associated with decreased metabolism and weight gain.  For this reason, should we see that you are unable to lose weight while taking these medications, it may become necessary for [place] to taper down and indefinitely discontinue them.  2.    Technical Problems: The incidence of successful interventional therapies decreases as the patient's BMI increases. It is much more difficult to accomplish a safe and effective interventional therapy on a patient with a BMI above 35. 3.    Radiation Exposure Problems: The x-rays machine, used to accomplish injection therapies, will automatically increase their x-ray output in order to capture an appropriate bone image. This means that radiation exposure increases exponentially with the patient's BMI. (The higher the BMI, the higher the radiation exposure.) Although the level of radiation used at a given time is still safe to the patient, it is not for the physician and/or assisting staff. Unfortunately, radiation exposure is accumulative. Because physicians and the staff have to do procedures and be exposed on a daily basis,  this can result in health problems such as cancer and radiation burns. Radiation exposure to the staff is monitored by the radiation batches that they wear. The exposure levels are reported back  to the staff on a quarterly basis. Depending on levels of exposure, physicians and staff may be obligated by law to decrease this exposure. This means that they have the right and obligation to refuse providing therapies where they may be overexposed to radiation. For this reason, physicians may decline to offer therapies such as radiofrequency ablation or implants to patients with a BMI above 40. 4.    Current Trends: Be advised that the current trend is to no longer offer certain therapies to patients with a BMI equal to, or above 35, due to increase perioperative risks, increased technical procedural difficulties, and excessive radiation exposure to healthcare personnel.  _____________________________________________________________________________________________

## 2022-08-23 NOTE — Progress Notes (Signed)
Nursing Pain Medication Assessment:  Safety precautions to be maintained throughout the outpatient stay will include: orient to surroundings, keep bed in low position, maintain call bell within reach at all times, provide assistance with transfer out of bed and ambulation.  Medication Inspection Compliance: Pill count conducted under aseptic conditions, in front of the patient. Neither the pills nor the bottle was removed from the patient's sight at any time. Once count was completed pills were immediately returned to the patient in their original bottle.  Medication: See above Pill/Patch Count:  33 of 90 pills remain Pill/Patch Appearance: Markings consistent with prescribed medication Bottle Appearance: Standard pharmacy container. Clearly labeled. Filled Date: 70 / 17 / 2023 Last Medication intake:  TodaySafety precautions to be maintained throughout the outpatient stay will include: orient to surroundings, keep bed in low position, maintain call bell within reach at all times, provide assistance with transfer out of bed and ambulation.

## 2022-08-24 DIAGNOSIS — F331 Major depressive disorder, recurrent, moderate: Secondary | ICD-10-CM | POA: Diagnosis not present

## 2022-08-30 ENCOUNTER — Ambulatory Visit: Payer: Medicare HMO

## 2022-08-30 DIAGNOSIS — Z6841 Body Mass Index (BMI) 40.0 and over, adult: Secondary | ICD-10-CM | POA: Diagnosis not present

## 2022-08-30 DIAGNOSIS — G4733 Obstructive sleep apnea (adult) (pediatric): Secondary | ICD-10-CM | POA: Diagnosis not present

## 2022-08-30 DIAGNOSIS — R000 Tachycardia, unspecified: Secondary | ICD-10-CM | POA: Diagnosis not present

## 2022-08-30 DIAGNOSIS — R079 Chest pain, unspecified: Secondary | ICD-10-CM | POA: Diagnosis not present

## 2022-08-30 DIAGNOSIS — R0602 Shortness of breath: Secondary | ICD-10-CM | POA: Diagnosis not present

## 2022-08-30 DIAGNOSIS — E119 Type 2 diabetes mellitus without complications: Secondary | ICD-10-CM | POA: Diagnosis not present

## 2022-08-30 DIAGNOSIS — I10 Essential (primary) hypertension: Secondary | ICD-10-CM | POA: Diagnosis not present

## 2022-09-02 DIAGNOSIS — Z96641 Presence of right artificial hip joint: Secondary | ICD-10-CM | POA: Diagnosis not present

## 2022-09-02 DIAGNOSIS — G63 Polyneuropathy in diseases classified elsewhere: Secondary | ICD-10-CM | POA: Diagnosis not present

## 2022-09-02 DIAGNOSIS — N390 Urinary tract infection, site not specified: Secondary | ICD-10-CM | POA: Diagnosis not present

## 2022-09-02 DIAGNOSIS — E785 Hyperlipidemia, unspecified: Secondary | ICD-10-CM | POA: Diagnosis not present

## 2022-09-02 DIAGNOSIS — Z96642 Presence of left artificial hip joint: Secondary | ICD-10-CM | POA: Diagnosis not present

## 2022-09-02 DIAGNOSIS — M170 Bilateral primary osteoarthritis of knee: Secondary | ICD-10-CM | POA: Diagnosis not present

## 2022-09-02 DIAGNOSIS — M47818 Spondylosis without myelopathy or radiculopathy, sacral and sacrococcygeal region: Secondary | ICD-10-CM | POA: Diagnosis not present

## 2022-09-02 DIAGNOSIS — Z6841 Body Mass Index (BMI) 40.0 and over, adult: Secondary | ICD-10-CM | POA: Diagnosis not present

## 2022-09-02 DIAGNOSIS — E1169 Type 2 diabetes mellitus with other specified complication: Secondary | ICD-10-CM | POA: Diagnosis not present

## 2022-09-02 DIAGNOSIS — E890 Postprocedural hypothyroidism: Secondary | ICD-10-CM | POA: Diagnosis not present

## 2022-09-02 DIAGNOSIS — I10 Essential (primary) hypertension: Secondary | ICD-10-CM | POA: Diagnosis not present

## 2022-09-02 DIAGNOSIS — E349 Endocrine disorder, unspecified: Secondary | ICD-10-CM | POA: Diagnosis not present

## 2022-09-02 DIAGNOSIS — Z96643 Presence of artificial hip joint, bilateral: Secondary | ICD-10-CM | POA: Diagnosis not present

## 2022-09-14 ENCOUNTER — Other Ambulatory Visit: Payer: Self-pay | Admitting: Student

## 2022-09-14 DIAGNOSIS — R079 Chest pain, unspecified: Secondary | ICD-10-CM

## 2022-09-17 DIAGNOSIS — F331 Major depressive disorder, recurrent, moderate: Secondary | ICD-10-CM | POA: Diagnosis not present

## 2022-09-20 ENCOUNTER — Other Ambulatory Visit: Payer: Self-pay | Admitting: Student

## 2022-09-20 DIAGNOSIS — R079 Chest pain, unspecified: Secondary | ICD-10-CM

## 2022-09-20 DIAGNOSIS — R0600 Dyspnea, unspecified: Secondary | ICD-10-CM | POA: Diagnosis not present

## 2022-09-20 DIAGNOSIS — R0602 Shortness of breath: Secondary | ICD-10-CM

## 2022-09-21 ENCOUNTER — Encounter (INDEPENDENT_AMBULATORY_CARE_PROVIDER_SITE_OTHER): Payer: Self-pay | Admitting: Nurse Practitioner

## 2022-09-21 ENCOUNTER — Ambulatory Visit (INDEPENDENT_AMBULATORY_CARE_PROVIDER_SITE_OTHER): Payer: Medicare HMO | Admitting: Nurse Practitioner

## 2022-09-21 ENCOUNTER — Ambulatory Visit
Admission: RE | Admit: 2022-09-21 | Discharge: 2022-09-21 | Disposition: A | Discharge status: Home / Self Care | Payer: Medicare HMO | Source: Ambulatory Visit | Attending: Student | Admitting: Student

## 2022-09-21 VITALS — BP 127/82 | HR 100 | Resp 16

## 2022-09-21 DIAGNOSIS — E1159 Type 2 diabetes mellitus with other circulatory complications: Secondary | ICD-10-CM

## 2022-09-21 DIAGNOSIS — I152 Hypertension secondary to endocrine disorders: Secondary | ICD-10-CM | POA: Diagnosis not present

## 2022-09-21 DIAGNOSIS — R079 Chest pain, unspecified: Secondary | ICD-10-CM | POA: Diagnosis not present

## 2022-09-21 DIAGNOSIS — R0602 Shortness of breath: Secondary | ICD-10-CM | POA: Diagnosis not present

## 2022-09-21 DIAGNOSIS — I890 Lymphedema, not elsewhere classified: Secondary | ICD-10-CM

## 2022-09-21 DIAGNOSIS — E119 Type 2 diabetes mellitus without complications: Secondary | ICD-10-CM | POA: Diagnosis not present

## 2022-09-21 MED ORDER — REGADENOSON 0.4 MG/5ML IV SOLN
0.4000 mg | Freq: Once | INTRAVENOUS | Status: AC
  Administered 2022-09-21: 0.4 mg via INTRAVENOUS

## 2022-09-21 MED ORDER — TECHNETIUM TC 99M TETROFOSMIN IV KIT
30.9400 | PACK | Freq: Once | INTRAVENOUS | Status: AC | PRN
  Administered 2022-09-21: 30.94 via INTRAVENOUS

## 2022-09-21 NOTE — Progress Notes (Signed)
*  PRELIMINARY RESULTS* Echocardiogram 2D Echocardiogram has been performed.  Sherrie Sport 09/21/2022, 10:29 AM

## 2022-09-22 ENCOUNTER — Ambulatory Visit
Admission: RE | Admit: 2022-09-22 | Discharge: 2022-09-22 | Disposition: A | Discharge status: Home / Self Care | Payer: Medicare HMO | Source: Ambulatory Visit | Attending: Student | Admitting: Student

## 2022-09-22 LAB — ECHOCARDIOGRAM COMPLETE: S' Lateral: 2.9 cm

## 2022-09-22 MED ORDER — TECHNETIUM TC 99M TETROFOSMIN IV KIT
30.0000 | PACK | Freq: Once | INTRAVENOUS | Status: AC
  Administered 2022-09-22: 29.97 via INTRAVENOUS

## 2022-09-24 DIAGNOSIS — F331 Major depressive disorder, recurrent, moderate: Secondary | ICD-10-CM | POA: Diagnosis not present

## 2022-10-01 ENCOUNTER — Other Ambulatory Visit: Payer: Medicare HMO

## 2022-10-03 ENCOUNTER — Encounter (INDEPENDENT_AMBULATORY_CARE_PROVIDER_SITE_OTHER): Payer: Self-pay | Admitting: Nurse Practitioner

## 2022-10-03 NOTE — Progress Notes (Signed)
Subjective:    Patient ID: Karen Lewis, female    DOB: Sep 13, 1971, 52 y.o.   MRN: [id number] Chief Complaint  Patient presents with   Follow-up    84yrfollow up    The patient returns to the office for followup evaluation regarding leg swelling.  The swelling has persisted but with the lymph pump is under much, much better controlled. The pain associated with swelling is decreased. There have not been any interval development of a ulcerations or wounds.  The patient denies problems with the pump, noting it is working well and the leggings are in good condition.  Since the previous visit the patient has been wearing graduated compression stockings and using the lymph pump on a routine basis and  has noted significant improvement in the lymphedema.   Patient stated the lymph pump has been helpful with the treatment of the lymphedema.      Review of Systems  Cardiovascular:  Positive for leg swelling.  All other systems reviewed and are negative.      Objective:   Physical Exam Vitals reviewed.  HENT:     Head: Normocephalic.  Cardiovascular:     Rate and Rhythm: Normal rate.  Pulmonary:     Effort: Pulmonary effort is normal.  Musculoskeletal:     Right lower leg: Edema present.     Left lower leg: Edema present.  Skin:    General: Skin is warm and dry.  Neurological:     Mental Status: She is alert and oriented to person, place, and time.  Psychiatric:        Mood and Affect: Mood normal.        Behavior: Behavior normal.        Thought Content: Thought content normal.        Judgment: Judgment normal.     BP 127/82 (BP Location: Left Arm)   Pulse 100   Resp 16   Past Medical History:  Diagnosis Date   Anemia    Anginal pain (HCC)    Anxiety    Arthralgia of hip 07/29/2015   Arthritis    Arthritis, degenerative 07/29/2015   Asthma    Cephalalgia 07/25/2014   Dependence on unknown drug ([place]    multiplt controlled drug dependence   Depression     Diabetes mellitus without complication ([place]    Difficult intubation    per pt needs small tube (has had abrasions from tube in past)   Dysrhythmia    PVC's   Eczema    Fibromyalgia    Gastritis    GERD (gastroesophageal reflux disease)    Gonalgia 07/29/2015   Overview:  Overview:  The patient has had bilateral intra-articular Hyalgan injections done on 07/16/2014 and although she seems to do well with this type of therapy, apparently her insurance company does not want to pay for they Hyalgan. On 11/27/2014 the patient underwent a bilateral genicular nerve block with excellent results. On 01/28/2015 she had a right knee genicular radiofrequency ablatio   Gout    H/O cardiovascular disorder 03/10/2015   H/O surgical procedure 12/05/2012   Overview:  LSG ([facility] - April 2013)     H/O thyroid disease 03/10/2015   Headache    Herpes    History of artificial joint 07/29/2015   History of hiatal hernia    Hypertension    Hypomagnesemia    Hypothyroidism    IDA (iron deficiency anemia) 05/28/2019   LBP (low back pain) 07/29/2015  Neuromuscular disorder (HCC)    Obesity    PCOS (polycystic ovarian syndrome)    Primary osteoarthritis of both knees 07/29/2015   Sleep apnea    not using a Cpap machine at this time - most recent test mild apnea does not qualify for test (not OSA)   Thyroid nodule bilateral   Umbilical hernia     Social History   Socioeconomic History   Marital status: Married    Spouse name: Not on file   Number of children: 0   Years of education: 12   Highest education level: High school graduate  Occupational History   Occupation: Disabled  Tobacco Use   Smoking status: Never   Smokeless tobacco: Never  Vaping Use   Vaping Use: Never used  Substance and Sexual Activity   Alcohol use: No    Alcohol/week: 0.0 standard drinks of alcohol   Drug use: No   Sexual activity: Not Currently    Birth control/protection: Injection  Other Topics Concern   Not on file   Social History Narrative   Not on file   Social Determinants of Health   Financial Resource Strain: Low Risk  (12/14/2019)   Overall Financial Resource Strain (CARDIA)    Difficulty of Paying Living Expenses: Not hard at all  Food Insecurity: No Food Insecurity (06/18/2021)   Hunger Vital Sign    Worried About Running Out of Food in the Last Year: Never true    [place] in the Last Year: Never true  Transportation Needs: No Transportation Needs (12/14/2019)   PRAPARE - Hydrologist (Medical): No    Lack of Transportation (Non-Medical): No  Physical Activity: Inactive (12/14/2019)   Exercise Vital Sign    Days of Exercise per Week: 0 days    Minutes of Exercise per Session: 0 min  Stress: No Stress Concern Present (01/20/2022)   [place]    Feeling of Stress : Only a little  Social Connections: Moderately Integrated (01/20/2022)   Social Connection and Isolation Panel [NHANES]    Frequency of Communication with Friends and Family: More than three times a week    Frequency of Social Gatherings with Friends and Family: More than three times a week    Attends Religious Services: More than 4 times per year    Active Member of [employer] or Organizations: No    Attends Archivist Meetings: Never    Marital Status: Married  Human resources officer Violence: Not At Risk (12/14/2019)   Humiliation, Afraid, Rape, and Kick questionnaire    Fear of Current or Ex-Partner: No    Emotionally Abused: No    Physically Abused: No    Sexually Abused: No    Past Surgical History:  Procedure Laterality Date   APPENDECTOMY     CARPAL TUNNEL RELEASE Bilateral    CHOLECYSTECTOMY     COLONOSCOPY WITH PROPOFOL N/A 02/11/2021   Procedure: COLONOSCOPY WITH PROPOFOL;  Surgeon: Toledo, Benay Pike, MD;  Location: [facility];  Service: Gastroenterology;  Laterality: N/A;   DIAGNOSTIC LAPAROSCOPY      ESOPHAGOGASTRODUODENOSCOPY (EGD) WITH PROPOFOL N/A 02/11/2021   Procedure: ESOPHAGOGASTRODUODENOSCOPY (EGD) WITH PROPOFOL;  Surgeon: Toledo, Benay Pike, MD;  Location: [facility];  Service: Gastroenterology;  Laterality: N/A;   HIATAL HERNIA REPAIR     JOINT REPLACEMENT Bilateral hip   LAPAROSCOPIC PARTIAL GASTRECTOMY     left trigger finger     peniculectomy N/A 07/05/2018  ROUX-EN-Y GASTRIC BYPASS  06/03/2017   SHOULDER ARTHROSCOPY Right    THYROIDECTOMY N/A 11/12/2015   Procedure: THYROIDECTOMY;  Surgeon: Clyde Canterbury, MD;  Location: [facility];  Service: ENT;  Laterality: N/A;   TOTAL HIP ARTHROPLASTY Right 11/27/2019   Procedure: TOTAL HIP ARTHROPLASTY ANTERIOR APPROACH;  Surgeon: Hessie Knows, MD;  Location: [facility];  Service: Orthopedics;  Laterality: Right;   TRIGGER FINGER RELEASE Right    TRIGGER FINGER RELEASE Right 04/24/2020   Procedure: Right ring and middle trigger release;  Surgeon: Hessie Knows, MD;  Location: [facility];  Service: Orthopedics;  Laterality: Right;    Family History  Problem Relation Age of Onset   Anxiety disorder Mother    Depression Mother    Alcohol abuse Mother    Diabetes Mother    Hypertension Mother    Kidney cancer Mother    Sleep apnea Mother    Alcohol abuse Father    Anxiety disorder Father    Depression Father    Post-traumatic stress disorder Father    Kidney failure Father    COPD Father    Diabetes Father    Hypertension Father    Sleep apnea Father    Depression Brother    Diabetes Brother    Hypertension Brother    Sleep apnea Brother    Breast cancer Paternal Aunt 28   Breast cancer Paternal Aunt 61   Brain cancer Paternal Uncle 61   Bladder Cancer Neg Hx    Prostate cancer Neg Hx     Allergies  Allergen Reactions   Bactrim [Sulfamethoxazole-Trimethoprim] Hives   Omalizumab Itching and Hives   Ciprofloxacin Other (See Comments)    myalgia    Shellfish Allergy Other (See Comments)    + positive allergy test    Nsaids Nausea Only and Other (See Comments)    Stomach upset       Latest Ref Rng & Units 08/08/2022    3:51 AM 08/07/2022    3:37 PM 05/26/2022   12:50 PM  CBC  WBC 4.0 - 10.5 K/uL 7.3  9.6  9.9   Hemoglobin 12.0 - 15.0 g/dL 11.3  11.4  12.5   Hematocrit 36.0 - 46.0 % 34.7  35.7  38.6   Platelets 150 - 400 K/uL 201  217  258       CMP     Component Value Date/Time   NA 142 08/09/2022 0418   NA 139 06/17/2014 1811   K 4.8 08/09/2022 0418   K 3.6 06/17/2014 1811   CL 116 (H) 08/09/2022 0418   CL 111 (H) 06/17/2014 1811   CO2 21 (L) 08/09/2022 0418   CO2 22 06/17/2014 1811   GLUCOSE 117 (H) 08/09/2022 0418   GLUCOSE 99 06/17/2014 1811   BUN 17 08/09/2022 0418   BUN 7 06/17/2014 1811   CREATININE 1.04 (H) 08/09/2022 0418   CREATININE 0.98 06/17/2014 1811   CALCIUM 7.8 (L) 08/09/2022 0418   CALCIUM 8.5 06/17/2014 1811   PROT 6.2 (L) 08/07/2022 1537   PROT 7.4 06/17/2014 1811   ALBUMIN 3.5 08/07/2022 1537   ALBUMIN 3.5 06/17/2014 1811   AST 25 08/07/2022 1537   AST 23 06/17/2014 1811   ALT 40 08/07/2022 1537   ALT 22 06/17/2014 1811   ALKPHOS 74 08/07/2022 1537   ALKPHOS 90 06/17/2014 1811   BILITOT 0.5 08/07/2022 1537   BILITOT 0.2 06/17/2014 1811   GFRNONAA >60 08/09/2022 0418   GFRNONAA >60 06/17/2014 1811   GFRNONAA >60  12/13/2013 1628   GFRAA >60 04/16/2020 1340   GFRAA >60 06/17/2014 1811   GFRAA >60 12/13/2013 1628     No results found.     Assessment & Plan:   1. Lymphedema Recommend:  No surgery or intervention at this point in time.    I have reviewed my discussion with the patient regarding lymphedema and why it  causes symptoms.  Patient will continue wearing graduated compression on a daily basis. The patient should put the compression on first thing in the morning and removing them in the evening. The patient should not sleep in the compression.   In addition, behavioral modification throughout the day will be continued.  This will include  frequent elevation (such as in a recliner), use of over the counter pain medications as needed and exercise such as walking.  The systemic causes for chronic edema such as liver, kidney and cardiac etiologies does not appear to have significant changed over the past year.    The patient will continue aggressive use of the  lymph pump.  This will continue to improve the edema control and prevent sequela such as ulcers and infections.   The patient will follow-up with me on an annual basis.   2. Hypertension associated with diabetes ([place]) Continue antihypertensive medications as already ordered, these medications have been reviewed and there are no changes at this time.  3. Type 2 diabetes mellitus without complication, without long-term current use of insulin (HCC) Continue hypoglycemic medications as already ordered, these medications have been reviewed and there are no changes at this time.  Hgb A1C to be monitored as already arranged by primary service   Current Outpatient Medications on File Prior to Visit  Medication Sig Dispense Refill   ACCU-CHEK AVIVA PLUS test strip      acetaminophen (TYLENOL) 650 MG CR tablet Take 650-1,300 mg by mouth every 8 (eight) hours as needed for pain.     acidophilus (RISAQUAD) CAPS capsule Take 1 capsule by mouth daily.     albuterol (PROVENTIL HFA;VENTOLIN HFA) 108 (90 BASE) MCG/ACT inhaler Inhale 2 puffs into the lungs every 6 (six) hours as needed for wheezing or shortness of breath.     amitriptyline (ELAVIL) 75 MG tablet Take 75 mg by mouth at bedtime.     Ascorbic Acid (VITAMIN C) 1000 MG tablet Take 1,000 mg by mouth daily.      Biotin 10 MG CAPS Take 10 mg by mouth daily.      butalbital-acetaminophen-caffeine (FIORICET) 50-325-40 MG tablet Take 1 tablet by mouth every 6 (six) hours as needed for migraine.      calcium carbonate (TUMS EX) 750 MG chewable tablet Chew 1 tablet by mouth daily.      cefUROXime (CEFTIN) 250 MG tablet Take 250 mg by  mouth 2 (two) times daily.     clonazePAM (KLONOPIN) 0.5 MG tablet Take 0.5 mg by mouth 2 (two) times daily as needed for anxiety.     Cyanocobalamin (B-12) 2500 MCG TABS Take 2,500 mcg by mouth daily.     diclofenac sodium (VOLTAREN) 1 % GEL Apply 2 g topically 4 (four) times daily. (Patient taking differently: Apply 2 g topically 3 (three) times daily as needed (pain).) 100 g 1   diphenhydrAMINE (BENADRYL) 25 MG tablet Take 25 mg by mouth every 8 (eight) hours as needed for itching or allergies.      EPINEPHrine 0.3 mg/0.3 mL IJ SOAJ injection Inject 0.3 mg into the muscle as needed for  anaphylaxis.     ergocalciferol (VITAMIN D2) 1.25 MG (50000 UT) capsule Take 50,000 Units by mouth 2 (two) times a week.      famotidine (PEPCID) 20 MG tablet Take 20 mg by mouth at bedtime.     fluticasone furoate-vilanterol (BREO ELLIPTA) 200-25 MCG/INH AEPB Inhale 1 puff into the lungs daily.     furosemide (LASIX) 20 MG tablet Take 20 mg by mouth daily as needed for fluid.     Galcanezumab-gnlm (EMGALITY) 120 MG/ML SOAJ Inject 120 mg into the skin every 30 (thirty) days.     glucose blood (ACCU-CHEK AVIVA PLUS) test strip Use 2 (two) times daily     levocetirizine (XYZAL) 5 MG tablet Take 5 mg by mouth at bedtime.      levothyroxine (SYNTHROID) 175 MCG tablet Take 175 mcg by mouth daily before breakfast.     magnesium oxide (MAG-OX) 400 MG tablet Take 1,200 mg by mouth 2 (two) times daily. Take 1200 mg by mouth in the morning and 1200 mg at bedtime     meclizine (ANTIVERT) 25 MG tablet Take 25 mg by mouth 3 (three) times daily as needed for dizziness.      metFORMIN (GLUCOPHAGE-XR) 500 MG 24 hr tablet Take 1,000 mg by mouth 2 (two) times daily.     metoprolol tartrate (LOPRESSOR) 50 MG tablet Take 1 tablet (50 mg total) by mouth 2 (two) times daily. 60 tablet 0   montelukast (SINGULAIR) 10 MG tablet Take 10 mg by mouth at bedtime.      Multiple Vitamin (TAB-A-VITE) TABS Take 1 tablet by mouth daily.      naloxone (NARCAN) nasal spray 4 mg/0.1 mL Place 1 spray into the nose as needed for up to 365 doses (for opioid-induced respiratory depresssion). In case of emergency (overdose), spray once into each nostril. If no response within 3 minutes, repeat application and call 350. 1 each 0   naphazoline-pheniramine (NAPHCON-A) 0.025-0.3 % ophthalmic solution Place 1 drop into both eyes 4 (four) times daily as needed for eye irritation.     nitrofurantoin, macrocrystal-monohydrate, (MACROBID) 100 MG capsule Take 1 capsule (100 mg total) by mouth daily. 30 capsule 11   omeprazole (PRILOSEC) 40 MG capsule Take 40 mg by mouth in the morning and at bedtime.     oxyCODONE (OXY IR/ROXICODONE) 5 MG immediate release tablet Take 1 tablet (5 mg total) by mouth every 8 (eight) hours as needed for severe pain. Must last 30 days 90 tablet 0   oxyCODONE (OXY IR/ROXICODONE) 5 MG immediate release tablet Take 1 tablet (5 mg total) by mouth every 8 (eight) hours as needed for severe pain. Must last 30 days 90 tablet 0   [START ON 10/25/2022] oxyCODONE (OXY IR/ROXICODONE) 5 MG immediate release tablet Take 1 tablet (5 mg total) by mouth every 8 (eight) hours as needed for severe pain. Must last 30 days 90 tablet 0   OZEMPIC, 0.25 OR 0.5 MG/DOSE, 2 MG/1.5ML SOPN Inject 1 mg into the skin once a week.     QUEtiapine (SEROQUEL) 300 MG tablet Take 150 mg by mouth at bedtime.     rizatriptan (MAXALT) 10 MG tablet Take 1 and may repeat in 2 hours for MIgraines, alternate with the disintrgrating tablet.     rizatriptan (MAXALT-MLT) 10 MG disintegrating tablet Take 10 mg by mouth. Take 1 and may repeat in 2 hours  for Migraines     rosuvastatin (CRESTOR) 40 MG tablet Take 40 mg by mouth daily.  sertraline (ZOLOFT) 100 MG tablet Take 150 mg by mouth daily.     tiZANidine (ZANAFLEX) 4 MG tablet Take 1 tablet (4 mg total) by mouth 2 (two) times daily as needed for muscle spasms. 30 tablet 0   topiramate (TOPAMAX) 50 MG tablet Take 100  mg by mouth at bedtime.     No current facility-administered medications on file prior to visit.    There are no Patient Instructions on file for this visit. No follow-ups on file.   Kris Hartmann, NP

## 2022-10-05 DIAGNOSIS — G8929 Other chronic pain: Secondary | ICD-10-CM | POA: Diagnosis not present

## 2022-10-05 DIAGNOSIS — R110 Nausea: Secondary | ICD-10-CM | POA: Diagnosis not present

## 2022-10-05 DIAGNOSIS — E119 Type 2 diabetes mellitus without complications: Secondary | ICD-10-CM | POA: Diagnosis not present

## 2022-10-05 DIAGNOSIS — R0602 Shortness of breath: Secondary | ICD-10-CM | POA: Diagnosis not present

## 2022-10-05 DIAGNOSIS — G4733 Obstructive sleep apnea (adult) (pediatric): Secondary | ICD-10-CM | POA: Diagnosis not present

## 2022-10-05 DIAGNOSIS — G479 Sleep disorder, unspecified: Secondary | ICD-10-CM | POA: Diagnosis not present

## 2022-10-05 DIAGNOSIS — R000 Tachycardia, unspecified: Secondary | ICD-10-CM | POA: Diagnosis not present

## 2022-10-05 DIAGNOSIS — Z6841 Body Mass Index (BMI) 40.0 and over, adult: Secondary | ICD-10-CM | POA: Diagnosis not present

## 2022-10-05 DIAGNOSIS — H53149 Visual discomfort, unspecified: Secondary | ICD-10-CM | POA: Diagnosis not present

## 2022-10-05 DIAGNOSIS — G43719 Chronic migraine without aura, intractable, without status migrainosus: Secondary | ICD-10-CM | POA: Diagnosis not present

## 2022-10-05 DIAGNOSIS — M797 Fibromyalgia: Secondary | ICD-10-CM | POA: Diagnosis not present

## 2022-10-05 DIAGNOSIS — I10 Essential (primary) hypertension: Secondary | ICD-10-CM | POA: Diagnosis not present

## 2022-10-05 DIAGNOSIS — R079 Chest pain, unspecified: Secondary | ICD-10-CM | POA: Diagnosis not present

## 2022-10-06 DIAGNOSIS — M170 Bilateral primary osteoarthritis of knee: Secondary | ICD-10-CM | POA: Diagnosis not present

## 2022-10-13 DIAGNOSIS — M170 Bilateral primary osteoarthritis of knee: Secondary | ICD-10-CM | POA: Diagnosis not present

## 2022-10-15 ENCOUNTER — Ambulatory Visit: Payer: Medicare HMO | Admitting: Orthopedic Surgery

## 2022-10-15 DIAGNOSIS — M170 Bilateral primary osteoarthritis of knee: Secondary | ICD-10-CM | POA: Diagnosis not present

## 2022-10-18 LAB — NM MYOCAR MULTI W/SPECT W/WALL MOTION / EF
Base ST Depression (mm): 0 mm
Estimated workload: 1
Exercise duration (sec): 58 s
LV dias vol: 135 mL (ref 46–106)
LV sys vol: 47 mL
MPHR: 168 {beats}/min
Nuc Stress EF: 65 %
Peak HR: 109 {beats}/min
Percent HR: 64 %
Rest HR: 86 {beats}/min
Rest Nuclear Isotope Dose: 30 mCi
SDS: 1
SRS: 7
SSS: 8
ST Depression (mm): 0 mm
Stress Nuclear Isotope Dose: 30.9 mCi
TID: 1.21
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
## 2022-10-21 ENCOUNTER — Ambulatory Visit: Payer: Medicare HMO | Admitting: Orthopedic Surgery

## 2022-10-21 ENCOUNTER — Encounter: Payer: Self-pay | Admitting: Orthopedic Surgery

## 2022-10-21 VITALS — BP 124/60 | Ht 63.0 in | Wt 362.0 lb

## 2022-10-21 DIAGNOSIS — M47816 Spondylosis without myelopathy or radiculopathy, lumbar region: Secondary | ICD-10-CM

## 2022-10-21 DIAGNOSIS — M48061 Spinal stenosis, lumbar region without neurogenic claudication: Secondary | ICD-10-CM

## 2022-10-21 DIAGNOSIS — M4726 Other spondylosis with radiculopathy, lumbar region: Secondary | ICD-10-CM

## 2022-10-21 DIAGNOSIS — M4316 Spondylolisthesis, lumbar region: Secondary | ICD-10-CM | POA: Diagnosis not present

## 2022-10-21 DIAGNOSIS — N390 Urinary tract infection, site not specified: Secondary | ICD-10-CM | POA: Diagnosis not present

## 2022-10-21 DIAGNOSIS — M5416 Radiculopathy, lumbar region: Secondary | ICD-10-CM

## 2022-10-21 DIAGNOSIS — G63 Polyneuropathy in diseases classified elsewhere: Secondary | ICD-10-CM | POA: Diagnosis not present

## 2022-10-21 DIAGNOSIS — E349 Endocrine disorder, unspecified: Secondary | ICD-10-CM | POA: Diagnosis not present

## 2022-10-21 DIAGNOSIS — M170 Bilateral primary osteoarthritis of knee: Secondary | ICD-10-CM | POA: Diagnosis not present

## 2022-10-21 NOTE — Patient Instructions (Addendum)
It was so nice to see you today. Thank you so much for coming in.    I sent physical therapy orders to [facility]. Let me know if they need anything else.   [facility] program may be helpful. The work on medically supervised weight management. They are in [place] and [place]. Let me know if you want me to put in a referral.   Weight goal for surgery would be 197-225 pounds.   I scheduled you a phone visit in 3 months. Please do not hesitate to call if you have any questions or concerns. You can also message me in [place].   Geronimo Boot PA-C [phone]

## 2022-10-22 DIAGNOSIS — M170 Bilateral primary osteoarthritis of knee: Secondary | ICD-10-CM | POA: Diagnosis not present

## 2022-10-26 DIAGNOSIS — M170 Bilateral primary osteoarthritis of knee: Secondary | ICD-10-CM | POA: Diagnosis not present

## 2022-10-27 DIAGNOSIS — F331 Major depressive disorder, recurrent, moderate: Secondary | ICD-10-CM | POA: Diagnosis not present

## 2022-10-29 DIAGNOSIS — M170 Bilateral primary osteoarthritis of knee: Secondary | ICD-10-CM | POA: Diagnosis not present

## 2022-11-01 DIAGNOSIS — M170 Bilateral primary osteoarthritis of knee: Secondary | ICD-10-CM | POA: Diagnosis not present

## 2022-11-05 ENCOUNTER — Telehealth: Payer: Self-pay | Admitting: Orthopedic Surgery

## 2022-11-05 NOTE — Telephone Encounter (Signed)
She is not working, she has not worked for years since 2009. She is receiving money from them with her long team disability. They request office notes from all her doctors.

## 2022-11-05 NOTE — Telephone Encounter (Signed)
I received paperwork from [employer] regarding her job.  What is her job?  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  How much does he have to lift?   Is she working now or is she out?   Thanks!

## 2022-11-10 DIAGNOSIS — M170 Bilateral primary osteoarthritis of knee: Secondary | ICD-10-CM | POA: Diagnosis not present

## 2022-11-11 DIAGNOSIS — G43719 Chronic migraine without aura, intractable, without status migrainosus: Secondary | ICD-10-CM | POA: Diagnosis not present

## 2022-11-16 NOTE — Patient Instructions (Signed)
____________________________________________________________________________________________  Patient Information update  To: All of our patients.  Re: Name change.  It has been made official that our current name, "[facility]"   will soon be changed to "[facility]".   The purpose of this change is to eliminate any confusion created by the concept of our practice being a "Medication Management Pain Clinic". In the past this has led to the misconception that we treat pain primarily by the use of prescription medications.  Nothing can be farther from the truth.   Understanding PAIN MANAGEMENT: To further understand what our practice does, you first have to understand that "Pain Management" is a subspecialty that requires additional training once a physician has completed their specialty training, which can be in either Anesthesia, Neurology, Psychiatry, or Physical Medicine and Rehabilitation (PMR). Each one of these contributes to the final approach taken by each physician to the management of their patient's pain. To be a "Pain Management Specialist" you must have first completed one of the specialty trainings below.  Anesthesiologists - trained in clinical pharmacology and interventional techniques such as nerve blockade and regional as well as central neuroanatomy. They are trained to block pain before, during, and after surgical interventions.  Neurologists - trained in the diagnosis and pharmacological treatment of complex neurological conditions, such as Multiple Sclerosis, Parkinson's, spinal cord injuries, and other systemic conditions that may be associated with symptoms that may include but are not limited to pain. They tend to rely primarily on the treatment of chronic pain using prescription medications.  Psychiatrist - trained in conditions affecting the psychosocial  wellbeing of patients including but not limited to depression, anxiety, schizophrenia, personality disorders, addiction, and other substance use disorders that may be associated with chronic pain. They tend to rely primarily on the treatment of chronic pain using prescription medications.   Physical Medicine and Rehabilitation (PMR) physicians, also known as physiatrists - trained to treat a wide variety of medical conditions affecting the brain, spinal cord, nerves, bones, joints, ligaments, muscles, and tendons. Their training is primarily aimed at treating patients that have suffered injuries that have caused severe physical impairment. Their training is primarily aimed at the physical therapy and rehabilitation of those patients. They may also work alongside orthopedic surgeons or neurosurgeons using their expertise in assisting surgical patients to recover after their surgeries.  INTERVENTIONAL PAIN MANAGEMENT is sub-subspecialty of Pain Management.  Our physicians are Board-certified in Anesthesia, Pain Management, and Interventional Pain Management.  This meaning that not only have they been trained and Board-certified in their specialty of Anesthesia, and subspecialty of Pain Management, but they have also received further training in the sub-subspecialty of Interventional Pain Management, in order to become Board-certified as INTERVENTIONAL PAIN MANAGEMENT SPECIALIST.    Mission: Our goal is to use our skills in  [place] as alternatives to the chronic use of prescription opioid medications for the treatment of pain. To make this more clear, we have changed our name to reflect what we do and offer. We will continue to offer medication management assessment and recommendations, but we will not be taking over any patient's medication management.  ____________________________________________________________________________________________      ____________________________________________________________________________________________  Opioid Pain Medication Update  To: All patients taking opioid pain medications. (I.e.: hydrocodone, hydromorphone, oxycodone, oxymorphone, morphine, codeine, methadone, tapentadol, tramadol, buprenorphine, fentanyl, etc.)  Re: Updated review of side effects and adverse reactions of opioid analgesics, as well as new  information about long term effects of this class of medications.  Direct risks of long-term opioid therapy are not limited to opioid addiction and overdose. Potential medical risks include serious fractures, breathing problems during sleep, hyperalgesia, immunosuppression, chronic constipation, bowel obstruction, myocardial infarction, and tooth decay secondary to xerostomia.  Unpredictable adverse effects that can occur even if you take your medication correctly: Cognitive impairment, respiratory depression, and death. Most people think that if they take their medication "correctly", and "as instructed", that they will be safe. Nothing could be farther from the truth. In reality, a significant amount of recorded deaths associated with the use of opioids has occurred in individuals that had taken the medication for a long time, and were taking their medication correctly. The following are examples of how this can happen: Patient taking his/her medication for a long time, as instructed, without any side effects, is given a certain antibiotic or another unrelated medication, which in turn triggers a "Drug-to-drug interaction" leading to disorientation, cognitive impairment, impaired reflexes, respiratory depression or an untoward event leading to serious bodily harm or injury, including death.  Patient taking his/her medication for a long time, as instructed, without any side effects, develops an acute impairment of liver and/or kidney function. This will lead to a rapid inability of the body to  breakdown and eliminate their pain medication, which will result in effects similar to an "overdose", but with the same medicine and dose that they had always taken. This again may lead to disorientation, cognitive impairment, impaired reflexes, respiratory depression or an untoward event leading to serious bodily harm or injury, including death.  A similar problem will occur with patients as they grow older and their liver and kidney function begins to decrease as part of the aging process.  Background information: Historically, the original case for using long-term opioid therapy to treat chronic noncancer pain was based on safety assumptions that subsequent experience has called into question. In 1996, the American Pain Society and the [place] Academy of [employer] Medicine issued a consensus statement supporting long-term opioid therapy. This statement acknowledged the dangers of opioid prescribing but concluded that the risk for addiction was low; respiratory depression induced by opioids was short-lived, occurred mainly in opioid-naive patients, and was antagonized by pain; tolerance was not a common problem; and efforts to control diversion should not constrain opioid prescribing. This has now proven to be wrong. Experience regarding the risks for opioid addiction, misuse, and overdose in community practice has failed to support these assumptions.  According to the Centers for Disease Control and Prevention, fatal overdoses involving opioid analgesics have increased sharply over the past decade. Currently, more than 96,700 people die from drug overdoses every year. Opioids are a factor in 7 out of every 10 overdose deaths. Deaths from drug overdose have surpassed motor vehicle accidents as the leading cause of death for individuals between the ages of 8 and 36.  Clinical data suggest that neuroendocrine dysfunction may be very common in both men and women, potentially causing hypogonadism, erectile  dysfunction, infertility, decreased libido, osteoporosis, and depression. Recent studies linked higher opioid dose to increased opioid-related mortality. Controlled observational studies reported that long-term opioid therapy may be associated with increased risk for cardiovascular events. Subsequent meta-analysis concluded that the safety of long-term opioid therapy in elderly patients has not been proven.   Side Effects and adverse reactions: Common side effects: Drowsiness (sedation). Dizziness. Nausea and vomiting. Constipation. Physical dependence -- Dependence often manifests with withdrawal symptoms when opioids are discontinued  or decreased. Tolerance -- As you take repeated doses of opioids, you require increased medication to experience the same effect of pain relief. Respiratory depression -- This can occur in healthy people, especially with higher doses. However, people with COPD, asthma or other lung conditions may be even more susceptible to fatal respiratory impairment.  Uncommon side effects: An increased sensitivity to feeling pain and extreme response to pain (hyperalgesia). Chronic use of opioids can lead to this. Delayed gastric emptying (the process by which the contents of your stomach are moved into your small intestine). Muscle rigidity. Immune system and hormonal dysfunction. Quick, involuntary muscle jerks (myoclonus). Arrhythmia. Itchy skin (pruritus). Dry mouth (xerostomia).  Long-term side effects: Chronic constipation. Sleep-disordered breathing (SDB). Increased risk of bone fractures. Hypothalamic-pituitary-adrenal dysregulation. Increased risk of overdose.  RISKS: Fractures and Falls:  Opioids increase the risk and incidence of falls. This is of particular importance in elderly patients.  Endocrine System:  Long-term administration is associated with endocrine abnormalities (endocrinopathies). (Also known as Opioid-induced Endocrinopathy) Influences  on both the hypothalamic-pituitary-adrenal axis?and the hypothalamic-pituitary-gonadal axis have been demonstrated with consequent hypogonadism and adrenal insufficiency in both sexes. Hypogonadism and decreased levels of dehydroepiandrosterone sulfate have been reported in men and women. Endocrine effects include: Amenorrhoea in women (abnormal absence of menstruation) Reduced libido in both sexes Decreased sexual function Erectile dysfunction in men Hypogonadisms (decreased testicular function with shrinkage of testicles) Infertility Depression and fatigue Loss of muscle mass Anxiety Depression Immune suppression Hyperalgesia Weight gain Anemia Osteoporosis Patients (particularly women of childbearing age) should avoid opioids. There is insufficient evidence to recommend routine monitoring of asymptomatic patients taking opioids in the long-term for hormonal deficiencies.  Immune System: Human studies have demonstrated that opioids have an immunomodulating effect. These effects are mediated via opioid receptors both on immune effector cells and in the central nervous system. Opioids have been demonstrated to have adverse effects on antimicrobial response and anti-tumour surveillance. Buprenorphine has been demonstrated to have no impact on immune function.  Opioid Induced Hyperalgesia: Human studies have demonstrated that prolonged use of opioids can lead to a state of abnormal pain sensitivity, sometimes called opioid induced hyperalgesia (OIH). Opioid induced hyperalgesia is not usually seen in the absence of tolerance to opioid analgesia. Clinically, hyperalgesia may be diagnosed if the patient on long-term opioid therapy presents with increased pain. This might be qualitatively and anatomically distinct from pain related to disease progression or to breakthrough pain resulting from development of opioid tolerance. Pain associated with hyperalgesia tends to be more diffuse than the  pre-existing pain and less defined in quality. Management of opioid induced hyperalgesia requires opioid dose reduction.  Cancer: Chronic opioid therapy has been associated with an increased risk of cancer among noncancer patients with chronic pain. This association was more evident in chronic strong opioid users. Chronic opioid consumption causes significant pathological changes in the small intestine and colon. Epidemiological studies have found that there is a link between opium dependence and initiation of gastrointestinal cancers. Cancer is the second leading cause of death after cardiovascular disease. Chronic use of opioids can cause multiple conditions such as GERD, immunosuppression and renal damage as well as carcinogenic effects, which are associated with the incidence of cancers.   Mortality: Long-term opioid use has been associated with increased mortality among patients with chronic non-cancer pain (CNCP).  Prescription of long-acting opioids for chronic noncancer pain was associated with a significantly increased risk of all-cause mortality, including deaths from causes other than overdose.  Reference: Von Glenetta Hew,  Kolodny A, Deyo RA, Chou R. Long-term opioid therapy reconsidered. Ann Intern Med. 2011 Sep 6;155(5):325-8. doi: 10.7326/[id number]. PMID: [id number]; PMCIDXX:1631110. Morley Kos, Hayward RA, Dunn KM, [place] KP. Risk of adverse events in patients prescribed long-term opioids: A cohort study in the [place] Clinical Practice Research D[employer]. Eur J Pain. 2019 May;23(5):908-922. doi: 10.1002/ejp.1357. Epub 2019 Jan 31. PMID: [id number]. Colameco S, Coren JS, Ciervo CA. Continuous opioid treatment for chronic noncancer pain: a time for moderation in prescribing. Postgrad Med. 2009 Jul;121(4):61-6. doi: 10.3810/pgm.2009.07.2032. PMID: [id number]. Heywood Bene RN, Nogales SD, Blazina I, Rosalio Loud, Bougatsos C, Deyo RA. The  effectiveness and risks of long-term opioid therapy for chronic pain: a systematic review for a [employer] of Health Pathways to [employer]. Ann Intern Med. 2015 Feb 17;162(4):276-86. doi: [id number]. PMID: [id number]. Marjory Sneddon [facility], Makuc DM. NCHS Data Brief No. 22. [place]: Centers for Disease Control and Prevention; 2009. Sep, Increase in Fatal Poisonings Involving Opioid Analgesics in the [place], 1999-2006. Song IA, Choi HR, Oh TK. Long-term opioid use and mortality in patients with chronic non-cancer pain: Ten-year follow-up study in [place] from 2010 through 2019. EClinicalMedicine. 2022 Jul 18;51:101558. doi: 10.1016/j.eclinm.2022.UB:[id number]. PMID: [id number]; PMCIDOX:8550940. Huser, W., Schubert, T., Vogelmann, T. et al. All-cause mortality in patients with long-term opioid therapy compared with non-opioid analgesics for chronic non-cancer pain: a database study. [place] Med 18, 162 (2020). [email or web address] Rashidian H, Roxy Cedar, Malekzadeh R, Haghdoost AA. An Ecological Study of the Association between Opiate Use and Incidence of Cancers. Addict Health. 2016 Fall;8(4):252-260. PMID: [id number]; PMCIDQI:9185013.  Our Goal: Our goal is to control your pain with means other than the use of opioid pain medications.  Our Recommendation: Talk to your physician about coming off of these medications. We can assist you with the tapering down and stopping these medicines. Based on the new information, even if you cannot completely stop the medication, a decrease in the dose may be associated with a lesser risk. Ask for other means of controlling the pain. Decrease or eliminate those factors that significantly contribute to your pain such as smoking, obesity, and a diet heavily tilted towards "inflammatory" nutrients.  Last Updated: 11/10/2022    ____________________________________________________________________________________________     ____________________________________________________________________________________________  National Pain Medication Shortage  The [place] is experiencing worsening drug shortages. These have had a negative widespread effect on patient care and treatment. Not expected to improve any time soon. Predicted to last past 2029.   Drug shortage list (generic names) Oxycodone IR Oxycodone/APAP Oxymorphone IR Hydromorphone Hydrocodone/APAP Morphine  Where is the problem?  Manufacturing and supply level.  Will this shortage affect you?  Only if you take any of the above pain medications.  How? You may be unable to fill your prescription.  Your pharmacist may offer a "partial fill" of your prescription. (Warning: Do not accept partial fills.) Prescriptions partially filled cannot be transferred to another pharmacy. Read our Medication Rules and Regulation. Depending on how much medicine you are dependent on, you may experience withdrawals when unable to get the medication.  Recommendations: Consider ending your dependence on opioid pain medications. Ask your pain specialist to assist you with the process. Consider switching to a medication currently not in shortage, such as Buprenorphine. Talk to your pain specialist about this option. Consider decreasing your pain medication requirements by managing tolerance thru "Drug Holidays". This may help minimize withdrawals, should you run out of medicine. Control your pain thru  the use of non-pharmacological interventional therapies.   Your prescriber: Prescribers cannot be blamed for shortages. Medication manufacturing and supply issues cannot be fixed by the prescriber.   NOTE: The prescriber is not responsible for supplying the medication, or solving supply issues. Work with your pharmacist to solve it. The patient is responsible for the decision  to take or continue taking the medication and for identifying and securing a legal supply source. By law, supplying the medication is the job and responsibility of the pharmacy. The prescriber is responsible for the evaluation, monitoring, and prescribing of these medications.   Prescribers will NOT: Re-issue prescriptions that have been partially filled. Re-issue prescriptions already sent to a pharmacy.  Re-send prescriptions to a different pharmacy because yours did not have your medication. Ask pharmacist to order more medicine or transfer the prescription to another pharmacy. (Read below.)  New 2023 regulation: "May 14, 2022 Revised Regulation Allows DEA-Registered Pharmacies to Transfer Electronic Prescriptions at a Patient's Request [place] Patients now have the ability to request their electronic prescription be transferred to another pharmacy without having to go back to their practitioner to initiate the request. This revised regulation went into effect on Monday, May 10, 2022.     At a patient's request, a DEA-registered retail pharmacy can now transfer an electronic prescription for a controlled substance (schedules II-V) to another DEA-registered retail pharmacy. Prior to this change, patients would have to go through their practitioner to cancel their prescription and have it re-issued to a different pharmacy. The process was taxing and time consuming for both patients and practitioners.    The Drug Enforcement Administration [facility]) published its intent to revise the process for transferring electronic prescriptions on August 01, 2020.  The final rule was published in the federal register on April 08, 2022 and went into effect 30 days later.  Under the final rule, a prescription can only be transferred once between pharmacies, and only if allowed under existing state or other applicable law. The prescription must remain in its  electronic form; may not be altered in any way; and the transfer must be communicated directly between two licensed pharmacists. It's important to note, any authorized refills transfer with the original prescription, which means the entire prescription will be filled at the same pharmacy".  Reference: [email or web address] [facility] website announcement)  [email or web address] ([place])   [employer] / Vol. 88, No. 143 / Thursday, April 08, 2022 / Rules and Regulations DEPARTMENT OF JUSTICE  Drug Enforcement Administration  21 CFR Part 1306  [Docket No. [id number]]  RIN [id number] Transfer of Electronic Prescriptions for Schedules II-V Controlled Substances Between Pharmacies for Initial Filling  ____________________________________________________________________________________________     ____________________________________________________________________________________________  Transfer of Pain Medication between Pharmacies  Re: 2023 DEA Clarification on existing regulation  Published on DEA Website: May 14, 2022  Title: Revised Regulation Allows DEA-Registered Pharmacies to Conservator, museum/gallery Prescriptions at a Patient's Request [place]  "Patients now have the ability to request their electronic prescription be transferred to another pharmacy without having to go back to their practitioner to initiate the request. This revised regulation went into effect on Monday, May 10, 2022.     At a patient's request, a DEA-registered retail pharmacy can now transfer an electronic prescription for a controlled substance (schedules II-V) to another DEA-registered retail pharmacy. Prior to this change, patients would have to go through their practitioner to cancel their  prescription  and have it re-issued to a different pharmacy. The process was taxing and time consuming for both patients and practitioners.    The Drug Enforcement Administration [facility]) published its intent to revise the process for transferring electronic prescriptions on August 01, 2020.  The final rule was published in the federal register on April 08, 2022 and went into effect 30 days later.  Under the final rule, a prescription can only be transferred once between pharmacies, and only if allowed under existing state or other applicable law. The prescription must remain in its electronic form; may not be altered in any way; and the transfer must be communicated directly between two licensed pharmacists. It's important to note, any authorized refills transfer with the original prescription, which means the entire prescription will be filled at the same pharmacy."    REFERENCES: 1. DEA website announcement [email or web address]  2. Department of Justice website  [email or web address]  3. DEPARTMENT OF JUSTICE Drug Enforcement Administration 21 CFR Part 1306 [Docket No. [id number]] RIN [id number] "Transfer of Electronic Prescriptions for Schedules II-V Controlled Substances Between Pharmacies for Initial Filling"  ____________________________________________________________________________________________     _______________________________________________________________________  Medication Rules  Purpose: To inform patients, and their family members, of our medication rules and regulations.  Applies to: All patients receiving prescriptions from our practice (written or electronic).  Pharmacy of record: This is the pharmacy where your electronic prescriptions will be sent. Make sure we have the correct one.  Electronic prescriptions: In compliance  with the [place] (STOP) Act of 2017 (Session Lanny Cramp [phone]), effective September 13, 2018, all controlled substances must be electronically prescribed. Written prescriptions, faxing, or calling prescriptions to a pharmacy will no longer be done.  Prescription refills: These will be provided only during in-person appointments. No medications will be renewed without a "face-to-face" evaluation with your provider. Applies to all prescriptions.  NOTE: The following applies primarily to controlled substances (Opioid* Pain Medications).   Type of encounter (visit): For patients receiving controlled substances, face-to-face visits are required. (Not an option and not up to the patient.)  Patient's responsibilities: Pain Pills: Bring all pain pills to every appointment (except for procedure appointments). Pill Bottles: Bring pills in original pharmacy bottle. Bring bottle, even if empty. Always bring the bottle of the most recent fill.  Medication refills: You are responsible for knowing and keeping track of what medications you are taking and when is it that you will need a refill. The day before your appointment: write a list of all prescriptions that need to be refilled. The day of the appointment: give the list to the admitting nurse. Prescriptions will be written only during appointments. No prescriptions will be written on procedure days. If you forget a medication: it will not be "Called in", "Faxed", or "electronically sent". You will need to get another appointment to get these prescribed. No early refills. Do not call asking to have your prescription filled early. Partial  or short prescriptions: Occasionally your pharmacy may not have enough pills to fill your prescription.  NEVER ACCEPT a partial fill or a prescription that is short of the total amount of pills that you were prescribed.  With controlled substances the law allows 72 hours for the pharmacy  to complete the prescription.  If the prescription is not completed within 72 hours, the pharmacist will require a new prescription to be written. This means that you will be short on your medicine and we WILL NOT send another prescription to complete your original  prescription.  Instead, request the pharmacy to send a carrier to a nearby branch to get enough medication to provide you with your full prescription. Prescription Accuracy: You are responsible for carefully inspecting your prescriptions before leaving our office. Have the discharge nurse carefully go over each prescription with you, before taking them home. Make sure that your name is accurately spelled, that your address is correct. Check the name and dose of your medication to make sure it is accurate. Check the number of pills, and the written instructions to make sure they are clear and accurate. Make sure that you are given enough medication to last until your next medication refill appointment. Taking Medication: Take medication as prescribed. When it comes to controlled substances, taking less pills or less frequently than prescribed is permitted and encouraged. Never take more pills than instructed. Never take the medication more frequently than prescribed.  Inform other Doctors: Always inform, all of your healthcare providers, of all the medications you take. Pain Medication from other Providers: You are not allowed to accept any additional pain medication from any other Doctor or Healthcare provider. There are two exceptions to this rule. (see below) In the event that you require additional pain medication, you are responsible for notifying us, as stated below. Cough Medicine: Often these contain an opioid, such as codeine or hydrocodone. Never accept or take cough medicine containing these opioids if you are already taking an opioid* medication. The combination may cause respiratory failure and death. Medication Agreement: You are  responsible for carefully reading and following our Medication Agreement. This must be signed before receiving any prescriptions from our practice. Safely store a copy of your signed Agreement. Violations to the Agreement will result in no further prescriptions. (Additional copies of our Medication Agreement are available upon request.) Laws, Rules, & Regulations: All patients are expected to follow all Federal and [employer], [employer], Rules, [facility]. Ignorance of the Laws does not constitute a valid excuse.  Illegal drugs and Controlled Substances: The use of illegal substances (including, but not limited to marijuana and its derivatives) and/or the illegal use of any controlled substances is strictly prohibited. Violation of this rule may result in the immediate and permanent discontinuation of any and all prescriptions being written by our practice. The use of any illegal substances is prohibited. Adopted CDC guidelines & recommendations: Target dosing levels will be at or below 60 MME/day. Use of benzodiazepines** is not recommended.  Exceptions: There are only two exceptions to the rule of not receiving pain medications from other Healthcare Providers. Exception #1 (Emergencies): In the event of an emergency (i.e.: accident requiring emergency care), you are allowed to receive additional pain medication. However, you are responsible for: As soon as you are able, call our office [phone], at any time of the day or night, and leave a message stating your name, the date and nature of the emergency, and the name and dose of the medication prescribed. In the event that your call is answered by a member of our staff, make sure to document and save the date, time, and the name of the person that took your information.  Exception #2 (Planned Surgery): In the event that you are scheduled by another doctor or dentist to have any type of surgery or procedure, you are allowed (for a period no longer  than 30 days), to receive additional pain medication, for the acute post-op pain. However, in this case, you are responsible for picking up a copy of  our "Post-op Pain Management for Surgeons" handout, and giving it to your surgeon or dentist. This document is available at our office, and does not require an appointment to obtain it. Simply go to our office during business hours (Monday-Thursday from 8:00 AM to 4:00 PM) (Friday 8:00 AM to 12:00 Noon) or if you have a scheduled appointment with [place], prior to your surgery, and ask for it by name. In addition, you are responsible for: calling our office [phone], at any time of the day or night, and leaving a message stating your name, name of your surgeon, type of surgery, and date of procedure or surgery. Failure to comply with your responsibilities may result in termination of therapy involving the controlled substances. Medication Agreement Violation. Following the above rules, including your responsibilities will help you in avoiding a Medication Agreement Violation ("Breaking your Pain Medication Contract").  Consequences:  Not following the above rules may result in permanent discontinuation of medication prescription therapy.  *Opioid medications include: morphine, codeine, oxycodone, oxymorphone, hydrocodone, hydromorphone, meperidine, tramadol, tapentadol, buprenorphine, fentanyl, methadone. **Benzodiazepine medications include: diazepam (Valium), alprazolam (Xanax), clonazepam (Klonopine), lorazepam (Ativan), clorazepate (Tranxene), chlordiazepoxide (Librium), estazolam (Prosom), oxazepam (Serax), temazepam (Restoril), triazolam (Halcion) (Last updated: 07/06/2022) ______________________________________________________________________    ______________________________________________________________________  Medication Recommendations and Reminders  Applies to: All patients receiving prescriptions (written and/or  electronic).  Medication Rules & Regulations: You are responsible for reading, knowing, and following our "Medication Rules" document. These exist for your safety and that of others. They are not flexible and neither are we. Dismissing or ignoring them is an act of "non-compliance" that may result in complete and irreversible termination of such medication therapy. For safety reasons, "non-compliance" will not be tolerated. As with the [place] fundamental legal principle of "ignorance of the law is no defense", we will accept no excuses for not having read and knowing the content of documents provided to you by our practice.  Pharmacy of record:  Definition: This is the pharmacy where your electronic prescriptions will be sent.  We do not endorse any particular pharmacy. It is up to you and your insurance to decide what pharmacy to use.  We do not restrict you in your choice of pharmacy. However, once we write for your prescriptions, we will NOT be re-sending more prescriptions to fix restricted supply problems created by your pharmacy, or your insurance.  The pharmacy listed in the electronic medical record should be the one where you want electronic prescriptions to be sent. If you choose to change pharmacy, simply notify our nursing staff. Changes will be made only during your regular appointments and not over the phone.  Recommendations: Keep all of your pain medications in a safe place, under lock and key, even if you live alone. We will NOT replace lost, stolen, or damaged medication. We do not accept "Police Reports" as proof of medications having been stolen. After you fill your prescription, take 1 week's worth of pills and put them away in a safe place. You should keep a separate, properly labeled bottle for this purpose. The remainder should be kept in the original bottle. Use this as your primary supply, until it runs out. Once it's gone, then you know that you have 1 week's worth of medicine,  and it is time to come in for a prescription refill. If you do this correctly, it is unlikely that you will ever run out of medicine. To make sure that the above recommendation works, it is very important that you make  sure your medication refill appointments are scheduled at least 1 week before you run out of medicine. To do this in an effective manner, make sure that you do not leave the office without scheduling your next medication management appointment. Always ask the nursing staff to show you in your prescription , when your medication will be running out. Then arrange for the receptionist to get you a return appointment, at least 7 days before you run out of medicine. Do not wait until you have 1 or 2 pills left, to come in. This is very poor planning and does not take into consideration that we may need to cancel appointments due to bad weather, sickness, or emergencies affecting our staff. DO NOT ACCEPT A "Partial Fill": If for any reason your pharmacy does not have enough pills/tablets to completely fill or refill your prescription, do not allow for a "partial fill". The law allows the pharmacy to complete that prescription within 72 hours, without requiring a new prescription. If they do not fill the rest of your prescription within those 72 hours, you will need a separate prescription to fill the remaining amount, which we will NOT provide. If the reason for the partial fill is your insurance, you will need to talk to the pharmacist about payment alternatives for the remaining tablets, but again, DO NOT ACCEPT A PARTIAL FILL, unless you can trust your pharmacist to obtain the remainder of the pills within 72 hours.  Prescription refills and/or changes in medication(s):  Prescription refills, and/or changes in dose or medication, will be conducted only during scheduled medication management appointments. (Applies to both, written and electronic prescriptions.) No refills on procedure days. No  medication will be changed or started on procedure days. No changes, adjustments, and/or refills will be conducted on a procedure day. Doing so will interfere with the diagnostic portion of the procedure. No phone refills. No medications will be "called into the pharmacy". No Fax refills. No weekend refills. No Holliday refills. No after hours refills.  Remember:  Business hours are:  Monday to Thursday 8:00 AM to 4:00 PM Provider's Schedule: Milinda Pointer, MD - Appointments are:  Medication management: Monday and Wednesday 8:00 AM to 4:00 PM Procedure day: Tuesday and Thursday 7:30 AM to 4:00 PM Gillis Santa, MD - Appointments are:  Medication management: Tuesday and Thursday 8:00 AM to 4:00 PM Procedure day: Monday and Wednesday 7:30 AM to 4:00 PM (Last update: 07/06/2022) ______________________________________________________________________    ____________________________________________________________________________________________  Drug Holidays  What is a "Drug Holiday"? Drug Holiday: is the name given to the process of slowly tapering down and temporarily stopping the pain medication for the purpose of decreasing or eliminating tolerance to the drug.  Benefits Improved effectiveness Decreased required effective dose Improved pain control End dependence on high dose therapy Decrease cost of therapy Uncovering "opioid-induced hyperalgesia". (OIH)  What is "opioid hyperalgesia"? It is a paradoxical increase in pain caused by exposure to opioids. Stopping the opioid pain medication, contrary to the expected, it actually decreases or completely eliminates the pain. Ref.: "A comprehensive review of opioid-induced hyperalgesia". Brion Aliment, et.al. Pain Physician. 2011 Mar-Apr;14(2):145-61.  What is tolerance? Tolerance: the progressive loss of effectiveness of a pain medicine due to repetitive use. A common problem of opioid pain medications.  How long should a "Drug  Holiday" last? Effectiveness depends on the patient staying off all opioid pain medicines for a minimum of 14 consecutive days. (2 weeks)  How about just taking less of the medicine? Does not  work. Will not accomplish goal of eliminating the excess receptors.  How about switching to a different pain medicine? (AKA. "Opioid rotation") Does not work. Creates the illusion of effectiveness by taking advantage of inaccurate equivalent dose calculations between different opioids. -This "technique" was promoted by studies funded by [employer], such as [employer], creators of "OxyContin".  Can I stop the medicine "cold [place]"? Depends. You should always coordinate with your Pain Specialist to make the transition as smoothly as possible. Avoid stopping the medicine abruptly without consulting. We recommend a "slow taper".  What is a slow taper? Taper: refers to the gradual decrease in dose.   How do I stop/taper the dose? Slowly. Decrease the daily amount of pills that you take by one (1) pill every seven (7) days. This is called a "slow downward taper". Example: if you normally take four (4) pills per day, drop it to three (3) pills per day for seven (7) days, then to two (2) pills per day for seven (7) days, then to one (1) per day for seven (7) days, and then stop the medicine. The 14 day "Drug Holiday" starts on the first day without medicine.   Will I experience withdrawals? Unlikely with a slow taper.  What triggers withdrawals? Withdrawals are triggered by the sudden/abrupt stop of high dose opioids. Withdrawals can be avoided by slowly decreasing the dose over a prolonged period of time.  What are withdrawals? Symptoms associated with sudden/abrupt reduction/stopping of high-dose, long-term use of pain medication. Withdrawal are seldom seen on low dose therapy, or patients rarely taking opioid medication.  Early Withdrawal Symptoms may include: Agitation Anxiety Muscle  aches Increased tearing Insomnia Runny nose Sweating Yawning  Late symptoms may include: Abdominal cramping Diarrhea Dilated pupils Goose bumps Nausea Vomiting  (Last update: 08/22/2022) ____________________________________________________________________________________________    ____________________________________________________________________________________________  WARNING: CBD (cannabidiol) & Delta (Delta-8 tetrahydrocannabinol) products.   Applicable to:  All individuals currently taking or considering taking CBD (cannabidiol) and, more important, all patients taking opioid analgesic controlled substances (pain medication). (Example: oxycodone; oxymorphone; hydrocodone; hydromorphone; morphine; methadone; tramadol; tapentadol; fentanyl; buprenorphine; butorphanol; dextromethorphan; meperidine; codeine; etc.)  Introduction:  Recently there has been a drive towards the use of "natural" products for the treatment of different conditions, including pain anxiety and sleep disorders. Marijuana and hemp are two varieties of the cannabis genus plants. Marijuana and its derivatives are illegal, while hemp and its derivatives are not. Cannabidiol (CBD) and tetrahydrocannabinol (THC), are two natural compounds found in plants of the Cannabis genus. They can both be extracted from hemp or marijuana. Both compounds interact with your body's endocannabinoid system in very different ways. CBD is associated with pain relief (analgesia) while THC is associated with the psychoactive effects ("the high") obtained from the use of marijuana products. There are two main types of THC: Delta-9, which comes from the marijuana plant and it is illegal, and Delta-8, which comes from the hemp plant, and it is legal. (Both, Delta-9-THC and Delta-8-THC are psychoactive and give you "the high".)   Legality:  Marijuana and its derivatives: illegal Hemp and its derivatives: Legal (State dependent) UPDATE:  (10/30/2021) The Drug Enforcement Agency ([place]) issued a letter stating that "delta" cannabinoids, including Delta-8-THCO and Delta-9-THCO, synthetically derived from hemp do not qualify as hemp and will be viewed as Schedule I drugs. (Schedule I drugs, substances, or chemicals are defined as drugs with no currently accepted medical use and a high potential for abuse. Some examples of Schedule I drugs are: heroin,  lysergic acid diethylamide (LSD), marijuana (cannabis), 3,4-methylenedioxymethamphetamine (ecstasy), methaqualone, and peyote.) (https://jennings.com/)  Legal status of CBD in [place]:  "Conditionally Legal"  Reference: "FDA Regulation of Cannabis and Cannabis-Derived Products, Including Cannabidiol (CBD)" - SeekArt[employer].com.pt  Warning:  CBD is not FDA approved and has not undergo the same manufacturing controls as prescription drugs.  This means that the purity and safety of available CBD may be questionable. Most of the time, despite manufacturer's claims, it is contaminated with THC (delta-9-tetrahydrocannabinol - the chemical in marijuana responsible for the "HIGH").  When this is the case, the [facility] contaminant will trigger a positive urine drug screen (UDS) test for Marijuana (carboxy-THC).   The FDA recently put out a warning about 5 things that everyone should be aware of regarding Delta-8 THC: Delta-8 THC products have not been evaluated or approved by the FDA for safe use and may be marketed in ways that put the public health at risk. The FDA has received adverse event reports involving delta-8 THC-containing products. Delta-8 THC has psychoactive and intoxicating effects. Delta-8 THC manufacturing often involve use of potentially harmful chemicals to create the concentrations of delta-8 THC claimed in the marketplace. The final delta-8 THC product may have potentially harmful  by-products (contaminants) due to the chemicals used in the process. Manufacturing of delta-8 THC products may occur in uncontrolled or unsanitary settings, which may lead to the presence of unsafe contaminants or other potentially harmful substances. Delta-8 THC products should be kept out of the reach of children and pets.  NOTE: Because a positive UDS for any illicit substance is a violation of our medication agreement, your opioid analgesics (pain medicine) may be permanently discontinued.  MORE ABOUT CBD  General Information: CBD was discovered in [birth year] and it is a derivative of the cannabis sativa genus plants (Marijuana and Hemp). It is one of the 113 identified substances found in Marijuana. It accounts for up to 40% of the plant's extract. As of 2018, preliminary clinical studies on CBD included research for the treatment of anxiety, movement disorders, and pain. CBD is available and consumed in multiple forms, including inhalation of smoke or vapor, as an aerosol spray, and by mouth. It may be supplied as an oil containing CBD, capsules, dried cannabis, or as a liquid solution. CBD is thought not to be as psychoactive as THC (delta-9-tetrahydrocannabinol - the chemical in marijuana responsible for the "HIGH"). Studies suggest that CBD may interact with different biological target receptors in the body, including cannabinoid and other neurotransmitter receptors. As of 2018 the mechanism of action for its biological effects has not been determined.  Side-effects  Adverse reactions: Dry mouth, diarrhea, decreased appetite, fatigue, drowsiness, malaise, weakness, sleep disturbances, and others.  Drug interactions:  CBD may interact with medications such as blood-thinners. CBD causes drowsiness on its own and it will increase drowsiness caused by other medications, including antihistamines (such as Benadryl), benzodiazepines (Xanax, Ativan, Valium), antipsychotics, antidepressants, opioids, alcohol  and supplements such as kava, melatonin and St. John's Wort.  Other drug interactions: Brivaracetam (Briviact); Caffeine; Carbamazepine (Tegretol); Citalopram (Celexa); Clobazam (Onfi); Eslicarbazepine (Aptiom); Everolimus (Zostress); Lithium; Methadone (Dolophine); Rufinamide (Banzel); Sedative medications (CNS depressants); Sirolimus (Rapamune); Stiripentol (Diacomit); Tacrolimus (Prograf); Tamoxifen ; Soltamox); Topiramate (Topamax); Valproate; Warfarin (Coumadin); Zonisamide. (Last update: 08/23/2022) ____________________________________________________________________________________________   ____________________________________________________________________________________________  Naloxone Nasal Spray  Why am I receiving this medication? [place] STOP ACT requires that all patients taking high dose opioids or at risk of opioids respiratory depression, be prescribed an opioid reversal agent, such as  Naloxone (AKA: Narcan).  What is this medication? NALOXONE (nal OX one) treats opioid overdose, which causes slow or shallow breathing, severe drowsiness, or trouble staying awake. Call emergency services after using this medication. You may need additional treatment. Naloxone works by reversing the effects of opioids. It belongs to a group of medications called opioid blockers.  COMMON BRAND NAME(S): Kloxxado, Narcan  What should I tell my care team before I take this medication? They need to know if you have any of these conditions: Heart disease Substance use disorder An unusual or allergic reaction to naloxone, other medications, foods, dyes, or preservatives Pregnant or trying to get pregnant Breast-feeding  When to use this medication? This medication is to be used for the treatment of respiratory depression (less than 8 breaths per minute) secondary to opioid overdose.   How to use this medication? This medication is for use in the nose. Lay the person on their back.  Support their neck with your hand and allow the head to tilt back before giving the medication. The nasal spray should be given into 1 nostril. After giving the medication, move the person onto their side. Do not remove or test the nasal spray until ready to use. Get emergency medical help right away after giving the first dose of this medication, even if the person wakes up. You should be familiar with how to recognize the signs and symptoms of a narcotic overdose. If more doses are needed, give the additional dose in the other nostril. Talk to your care team about the use of this medication in children. While this medication may be prescribed for children as young as newborns for selected conditions, precautions do apply.  Naloxone Overdosage: If you think you have taken too much of this medicine contact a poison control center or emergency room at once.  NOTE: This medicine is only for you. Do not share this medicine with others.  What if I miss a dose? This does not apply.  What may interact with this medication? This is only used during an emergency. No interactions are expected during emergency use. This list may not describe all possible interactions. Give your health care provider a list of all the medicines, herbs, non-prescription drugs, or dietary supplements you use. Also tell them if you smoke, drink alcohol, or use illegal drugs. Some items may interact with your medicine.  What should I watch for while using this medication? Keep this medication ready for use in the case of an opioid overdose. Make sure that you have the phone number of your care team and local hospital ready. You may need to have additional doses of this medication. Each nasal spray contains a single dose. Some emergencies may require additional doses. After use, bring the treated person to the nearest hospital or call 911. Make sure the treating care team knows that the person has received a dose of this medication.  You will receive additional instructions on what to do during and after use of this medication before an emergency occurs.  What side effects may I notice from receiving this medication? Side effects that you should report to your care team as soon as possible: Allergic reactions--skin rash, itching, hives, swelling of the face, lips, tongue, or throat Side effects that usually do not require medical attention (report these to your care team if they continue or are bothersome): Constipation Dryness or irritation inside the nose Headache Increase in blood pressure Muscle spasms Stuffy nose Toothache This list may not  describe all possible side effects. Call your doctor for medical advice about side effects. You may report side effects to FDA at 1-800-FDA-1088.  Where should I keep my medication? Because this is an emergency medication, you should keep it with you at all times.  Keep out of the reach of children and pets. Store between 20 and 25 degrees C (68 and 77 degrees F). Do not freeze. Throw away any unused medication after the expiration date. Keep in original box until ready to use.  NOTE: This sheet is a summary. It may not cover all possible information. If you have questions about this medicine, talk to your doctor, pharmacist, or health care provider.   2023 Elsevier/Gold Standard (2021-05-08 00:00:00)  ____________________________________________________________________________________________   _________________________________________________________________________________________  Body mass index (BMI) Weight Management Required  URGENT: Dear Ms. Carrizales your weight has been found to be adversely affecting your health. Aggressive action is immediately required. Talk to your primary care physician.  Body mass index (BMI) is a common tool for deciding whether a person has an appropriate body weight.  It measures a persons weight in relation to their height.   According  to the [facility] ([facility]H): A BMI of less than 18.5 means that a person is underweight. A BMI of between 18.5 and 24.9 is ideal. A BMI of between 25 and 29.9 is overweight. A BMI over 30 indicates obesity.  Body Mass Index (BMI) Classification BMI level (kg/m2) Category Associated incidence of chronic pain  <18  Underweight   18.5-24.9 Ideal body weight   25-29.9 Overweight  20%  30-34.9 Obese (Class I)  68%  35-39.9 Severe obesity (Class II)  136%  >40 Extreme obesity (Class III)  254%   Your current Estimated body mass index is 64.13 kg/m as calculated from the following:   Height as of 10/21/22: '5\' 3"'$  (1.6 m).   Weight as of 10/21/22: 362 lb (164.2 kg).  Morbidly Obese Classification: You will be considered to be "Morbidly Obese" if your BMI is above 30 and you have one or more of the following conditions caused or associated to obesity: 1.    Type 2 Diabetes (Leading to cardiovascular diseases (CVD), stroke, peripheral vascular diseases (PVD), retinopathy, nephropathy, and neuropathy) 2.    Cardiovascular Disease (High Blood Pressure; Congestive Heart Failure; High Cholesterol; Coronary Artery Disease; Angina; Arrhythmias, Dysrhythmias, or Heart Attacks) 3.    Breathing problems (Asthma; obesity-hypoventilation syndrome; obstructive sleep apnea; chronic inflammatory airway disease; reactive airway disease; or shortness of breath) 4.    Chronic kidney disease 5.    Liver disease (nonalcoholic fatty liver disease) 6.    High blood pressure 7.    Acid reflux (gastroesophageal reflux disease; heartburn) 8.    Osteoarthritis (OA) (affecting the hip(s), the knee(s) and/or the lower back) 9.    Low back pain (Lumbar Facet Syndrome; and/or Degenerative Disc Disease) 10.  Hip pain (Osteoarthritis of hip) (For every 1 lbs of added body weight, there is a 2 lbs increase in pressure inside of each hip articulation. 1:2 mechanical relationship) 11.  Knee pain (Osteoarthritis of knee)  (For every 1 lbs of added body weight, there is a 4 lbs increase in pressure inside of each knee articulation. 1:4 mechanical relationship) (patients with a BMI>30 kg/m2 were 6.8 times more likely to develop knee OA than normal-weight individuals) 12.  Cancer: Epidemiological studies have shown that obesity is a risk factor for: post-menopausal breast cancer; cancers of the endometrium, colon and kidney cancer; malignant  adenomas of the oesophagus. Obese subjects have an approximately 1.5-3.5-fold increased risk of developing these cancers compared with normal-weight subjects, and it has been estimated that between 15 and 45% of these cancers can be attributed to overweight. More recent studies suggest that obesity may also increase the risk of other types of cancer, including pancreatic, hepatic and gallbladder cancer. (Ref: Obesity and cancer. Pischon T, Nthlings U, [employer] H. Proc Nutr Soc. 2008 May;67(2):128-45. doi: [id number].) The International Agency for Research on Cancer (IARC) has identified 13 cancers associated with overweight and obesity: meningioma, multiple myeloma, adenocarcinoma of the esophagus, and cancers of the thyroid, postmenopausal breast cancer, gallbladder, stomach, liver, pancreas, kidney, ovaries, uterus, colon and rectal (colorectal) cancers. 103 percent of all cancers diagnosed in women and 24 percent of those diagnosed in men are associated with overweight and obesity.  Recommendation: At this point it is urgent that you take a step back and concentrate in loosing weight. Dedicate 100% of your efforts on this task. Nothing else will improve your health more than bringing your weight down and your BMI to less than 30. If you are here, you probably have chronic pain. We know that most chronic pain patients have difficulty exercising secondary to their pain. For this reason, you must rely on proper nutrition and diet in order to lose the weight. If your BMI is above 40,  you should seriously consider bariatric surgery. A realistic goal is to lose 10% of your body weight over a period of 12 months.  Be honest to yourself, if over time you have unsuccessfully tried to lose weight, then it is time for you to seek professional help and to enter a medically supervised weight management program, and/or undergo bariatric surgery. Stop procrastinating.   Pain management considerations:  1.    Pharmacological Problems: Be advised that the use of opioid analgesics (oxycodone; hydrocodone; morphine; methadone; codeine; and all of their derivatives) have been associated with decreased metabolism and weight gain.  For this reason, should we see that you are unable to lose weight while taking these medications, it may become necessary for [place] to taper down and indefinitely discontinue them.  2.    Technical Problems: The incidence of successful interventional therapies decreases as the patient's BMI increases. It is much more difficult to accomplish a safe and effective interventional therapy on a patient with a BMI above 35. 3.    Radiation Exposure Problems: The x-rays machine, used to accomplish injection therapies, will automatically increase their x-ray output in order to capture an appropriate bone image. This means that radiation exposure increases exponentially with the patient's BMI. (The higher the BMI, the higher the radiation exposure.) Although the level of radiation used at a given time is still safe to the patient, it is not for the physician and/or assisting staff. Unfortunately, radiation exposure is accumulative. Because physicians and the staff have to do procedures and be exposed on a daily basis, this can result in health problems such as cancer and radiation burns. Radiation exposure to the staff is monitored by the radiation batches that they wear. The exposure levels are reported back to the staff on a quarterly basis. Depending on levels of exposure, physicians and  staff may be obligated by law to decrease this exposure. This means that they have the right and obligation to refuse providing therapies where they may be overexposed to radiation. For this reason, physicians may decline to offer therapies such as radiofrequency ablation or implants to patients with a  BMI above 40. 4.    Current Trends: Be advised that the current trend is to no longer offer certain therapies to patients with a BMI equal to, or above 35, due to increase perioperative risks, increased technical procedural difficulties, and excessive radiation exposure to healthcare personnel.  _________________________________________________________________________________________

## 2022-11-16 NOTE — Progress Notes (Unsigned)
PROVIDER NOTE: Information contained herein reflects review and annotations entered in association with encounter. Interpretation of such information and data should be left to medically-trained personnel. Information provided to patient can be located elsewhere in the medical record under "Patient Instructions". Document created using STT-dictation technology, any transcriptional errors that may result from process are unintentional.    Patient: Karen Lewis  Service Category: E/M  Provider: Gaspar Cola, MD  DOB: 09/09/1971  DOS: 11/17/2022  Referring Provider: Idelle Crouch, MD  MRN: [id number]  Specialty: Interventional Pain Management  PCP: Idelle Crouch, MD  Type: Established Patient  Setting: Ambulatory outpatient    Location: Office  Delivery: Face-to-face     HPI  [name], a 52 y.o. year old female, is here today because of her Chronic pain syndrome [G89.4]. Ms. Apollo primary complain today is No chief complaint on file. Last encounter: My last encounter with her was on 08/23/2022. Pertinent problems: Ms. Delcour has Fibromyalgia; Chronic knee pain (1ry area of Pain) (Bilateral) (R>L); Chronic low back pain (3ry area of Pain) (Bilateral) (R>L) w/o sciatica; Lumbar facet syndrome (Bilateral) (R>L); Secondary osteoarthritis of multiple sites; Grade 1  Anterolisthesis of L3/L4 & L4/L5 (1.4 cm); Chronic hip pain (2ry area of Pain) (Right); S/P THR (total hip replacement) (Left); Lumbar spondylosis; Chronic pain syndrome; Neurogenic pain; Upper extremity pain; Chronic shoulder pain (Left); Osteoarthritis of shoulder (Left); Osteoarthritis of hip (Right); Secondary Osteoarthritis of knee (Bilateral) (R>L); Spondylosis without myelopathy or radiculopathy, lumbosacral region; Panniculitis; Leg swelling; Edema of both legs; History of total hip replacement (Bilateral); S/P THR (total hip replacement) (Right) (11/27/2019); Pain and numbness of left upper extremity; Cervical radiculitis  (Left); Bilateral primary osteoarthritis of knee; DDD (degenerative disc disease), lumbosacral; Osteoarthritis of lumbar spine without myelopathy or radiculopathy; Lumbosacral radiculopathy at S1 (Left); Tricompartment osteoarthritis of knees (Bilateral); Lumbosacral radiculopathy at L2 (Right); Abnormal MRI, lumbar spine (11/10/2021); Chronic thigh pain (Right); Weakness of leg (Right); Lumbar interspinous bursitis (L2-L5); Spinal enthesopathy of lumbar region [facility]); Lumbar facet arthropathy (Multilevel) (Bilateral); Lumbar central spinal stenosis, w/o claudication (L2-3, L4-5, L5-S1); Lumbar foraminal stenosis (Bilateral: L2-3, L4-5) (L>R); Lumbosacral lateral recess stenosis (Bilateral: L4-5, L5-S1); and Trigger point of right shoulder region on their pertinent problem list. Pain Assessment: Severity of   is reported as a  /10. Location:    / . Onset:  . Quality:  . Timing:  . Modifying factor(s):  Marland Kitchen Vitals:  vitals were not taken for this visit.  BMI: Estimated body mass index is 64.13 kg/m as calculated from the following:   Height as of 10/21/22: '5\' 3"'$  (1.6 m).   Weight as of 10/21/22: 362 lb (164.2 kg).  Reason for encounter: medication management. ***  Routine UDS ordered today.   RTCB: 02/22/2023   Pharmacotherapy Assessment  Analgesic: Oxycodone IR 5 mg 1 tab PO 3X/day (#90/mo) (15 mg/day) MME/day: 22.5 mg/day.   Monitoring: [place] PMP: PDMP reviewed during this encounter.       Pharmacotherapy: No side-effects or adverse reactions reported. Compliance: No problems identified. Effectiveness: Clinically acceptable.  No notes on file  No results found for: "CBDTHCR" No results found for: "D8THCCBX" No results found for: "D9THCCBX"  UDS:  Summary  Date Value Ref Range Status  10/26/2021 Note  Final    Comment:    ==================================================================== [employer] Select 13 (MW) ==================================================================== Test  Result       Flag       Units  Drug Present and Declared for Prescription Verification   7-aminoclonazepam              48           EXPECTED   ng/mg creat    7-aminoclonazepam is an expected metabolite of clonazepam. Source of    clonazepam is a scheduled prescription medication.    Noroxycodone                   518          EXPECTED   ng/mg creat    Noroxycodone is an expected metabolite of oxycodone. Sources of    oxycodone include scheduled prescription medications.  Drug Absent but Declared for Prescription Verification   Oxycodone                      Not Detected UNEXPECTED ng/mg creat    Oxycodone is almost always present in patients taking this drug    consistently.  Absence of oxycodone could be due to lapse of time    since the last dose or unusual pharmacokinetics (rapid metabolism).    Butalbital                     Not Detected UNEXPECTED ==================================================================== Test                      Result    Flag   Units      Ref Range   Creatinine              88               mg/dL      >=20 ==================================================================== Declared Medications:  The flagging and interpretation on this report are based on the  following declared medications.  Unexpected results may arise from  inaccuracies in the declared medications.   **Note: The testing scope of this panel includes these medications:   Butalbital (Fioricet)  Clonazepam (Klonopin)  Oxycodone   **Note: The testing scope of this panel does not include the  following reported medications:   Acetaminophen (Tylenol)  Acetaminophen (Fioricet)  Albuterol (Ventolin HFA)  Amitriptyline (Elavil)  Benzonatate (Tessalon)  Biotin  Caffeine (Fioricet)  Calcium (Tums)  Diclofenac (Voltaren)  Diphenhydramine (Benadryl)  Dupilumab (Dupixent)  Epinephrine  Eye Drop  Famotidine (Pepcid)  Fluticasone (Breo)  Furosemide  (Lasix)  Guaifenesin (Robitussin)  Levocetirizine (Xyzal)  Levothyroxine (Synthroid)  Lisinopril (Zestril)  Magnesium (Mag-Ox)  Meclizine (Antivert)  Metformin (Glucophage)  Metoprolol (Lopressor)  Montelukast (Singulair)  Multivitamin  Naloxone (Narcan)  Naproxen (Aleve)  Nitrofurantoin (Macrobid)  Omeprazole (Prilosec)  Pregabalin (Lyrica)  Probiotic  Quetiapine (Seroquel)  Rizatriptan (Maxalt)  Rosuvastatin (Crestor)  Semaglutide (Ozempic)  Spironolactone (Aldactone)  Tizanidine (Zanaflex)  Vilanterol (Breo)  Vitamin B12  Vitamin C  Vitamin D2  Zolpidem (Ambien) ==================================================================== For clinical consultation, please call [phone]. ====================================================================       ROS  Constitutional: Denies any fever or chills Gastrointestinal: No reported hemesis, hematochezia, vomiting, or acute GI distress Musculoskeletal: Denies any acute onset joint swelling, redness, loss of ROM, or weakness Neurological: No reported episodes of acute onset apraxia, aphasia, dysarthria, agnosia, amnesia, paralysis, loss of coordination, or loss of consciousness  Medication Review  B-12, Biotin, DULoxetine, EPINEPHrine, Galcanezumab-gnlm, Semaglutide(0.25 or 0.'5MG'$ /DOS), Tab-A-Vite, acetaminophen, acidophilus, albuterol, amitriptyline, butalbital-acetaminophen-caffeine, calcium carbonate, clonazePAM, diclofenac sodium, diphenhydrAMINE,  ergocalciferol, famotidine, fluticasone furoate-vilanterol, furosemide, glucose blood, levocetirizine, levothyroxine, magnesium oxide, meclizine, metFORMIN, metoprolol tartrate, montelukast, naloxone, naphazoline-pheniramine, nitrofurantoin (macrocrystal-monohydrate), omeprazole, oxyCODONE, rizatriptan, rosuvastatin, tiZANidine, topiramate, and vitamin C  History Review  Allergy: Ms. Reichling is allergic to bactrim [sulfamethoxazole-trimethoprim], omalizumab, ciprofloxacin,  shellfish allergy, and nsaids. Drug: Ms. Bartnicki  reports no history of drug use. Alcohol:  reports no history of alcohol use. Tobacco:  reports that she has never smoked. She has never used smokeless tobacco. Social: Ms. Schoenwald  reports that she has never smoked. She has never used smokeless tobacco. She reports that she does not drink alcohol and does not use drugs. Medical:  has a past medical history of Anemia, Anginal pain ([place]), Anxiety, Arthralgia of hip (07/29/2015), Arthritis, Arthritis, degenerative (07/29/2015), Asthma, Cephalalgia (07/25/2014), Dependence on unknown drug ([place]), Depression, Diabetes mellitus without complication ([place]), Difficult intubation, Dysrhythmia, Eczema, Fibromyalgia, Gastritis, GERD (gastroesophageal reflux disease), Gonalgia (07/29/2015), Gout, H/O cardiovascular disorder (03/10/2015), H/O surgical procedure (12/05/2012), H/O thyroid disease (03/10/2015), Headache, Herpes, History of artificial joint (07/29/2015), History of hiatal hernia, Hypertension, Hypomagnesemia, Hypothyroidism, IDA (iron deficiency anemia) (05/28/2019), LBP (low back pain) (07/29/2015), Neuromuscular disorder ([place]), Obesity, PCOS (polycystic ovarian syndrome), Primary osteoarthritis of both knees (07/29/2015), Sleep apnea, Thyroid nodule (bilateral), and Umbilical hernia. Surgical: Ms. Dilorenzo  has a past surgical history that includes Laparoscopic partial gastrectomy; Shoulder arthroscopy (Right); Carpal tunnel release (Bilateral); Diagnostic laparoscopy; Cholecystectomy; Trigger finger release (Right); Thyroidectomy (N/A, 11/12/2015); left trigger finger; Roux-en-Y Gastric Bypass (06/03/2017); Hiatal hernia repair; peniculectomy (N/A, 07/05/2018); Total hip arthroplasty (Right, 11/27/2019); Joint replacement (Bilateral, hip); Appendectomy; Trigger finger release (Right, 04/24/2020); Colonoscopy with propofol (N/A, 02/11/2021); and Esophagogastroduodenoscopy (egd) with propofol (N/A, 02/11/2021). Family: family  history includes Alcohol abuse in her father and mother; Anxiety disorder in her father and mother; Brain cancer (age of onset: 15) in her paternal uncle; Breast cancer (age of onset: 24) in her paternal aunt and paternal aunt; COPD in her father; Depression in her brother, father, and mother; Diabetes in her brother, father, and mother; Hypertension in her brother, father, and mother; Kidney cancer in her mother; Kidney failure in her father; Post-traumatic stress disorder in her father; Sleep apnea in her brother, father, and mother.  Laboratory Chemistry Profile   Renal Lab Results  Component Value Date   BUN 17 08/09/2022   CREATININE 1.04 (H) 08/09/2022   GFRAA >60 04/16/2020   GFRNONAA >60 08/09/2022    Hepatic Lab Results  Component Value Date   AST 25 08/07/2022   ALT 40 08/07/2022   ALBUMIN 3.5 08/07/2022   ALKPHOS 74 08/07/2022   LIPASE 37 08/07/2022    Electrolytes Lab Results  Component Value Date   NA 142 08/09/2022   K 4.8 08/09/2022   CL 116 (H) 08/09/2022   CALCIUM 7.8 (L) 08/09/2022   MG 2.5 (H) 08/07/2022   PHOS 5.5 (H) 05/22/2019    Bone Lab Results  Component Value Date   VD25OH 27.4 (L) 11/24/2015   VD125OH2TOT 41.5 11/24/2015    Inflammation (CRP: Acute Phase) (ESR: Chronic Phase) Lab Results  Component Value Date   CRP 0.7 09/27/2019   ESRSEDRATE 30 (H) 09/27/2019   LATICACIDVEN 1.7 08/07/2022         Note: Above Lab results reviewed.  Recent Imaging Review  NM Myocar Multi W/Spect W/Wall Motion / EF   The study is normal. The study is low risk.   No ST deviation was noted.   LV perfusion is normal. There is no evidence of ischemia. There  is no  evidence of infarction.   Left ventricular function is normal. Nuclear stress EF: 65 %. The left  ventricular ejection fraction is normal (55-65%). End diastolic cavity  size is normal.   Prior study not available for comparison.  Normal Myoview. No evidence of ischemia. This is a low risk  study. Note: Reviewed        Physical Exam  General appearance: Well nourished, well developed, and well hydrated. In no apparent acute distress Mental status: Alert, oriented x 3 (person, place, & time)       Respiratory: No evidence of acute respiratory distress Eyes: PERLA Vitals: There were no vitals taken for this visit. BMI: Estimated body mass index is 64.13 kg/m as calculated from the following:   Height as of 10/21/22: '5\' 3"'$  (1.6 m).   Weight as of 10/21/22: 362 lb (164.2 kg). Ideal: Patient weight not recorded  Assessment   Diagnosis Status  1. Chronic pain syndrome   2. Chronic knee pain (1ry area of Pain) (Bilateral) (R>L)   3. Secondary osteoarthritis of multiple sites   4. Chronic low back pain (3ry area of Pain) (Bilateral) (R>L) w/o sciatica   5. Pharmacologic therapy   6. Chronic use of opiate for therapeutic purpose   7. Encounter for medication management   8. Encounter for chronic pain management    Controlled Controlled Controlled   Updated Problems: No problems updated.  Plan of Care  Problem-specific:  No problem-specific Assessment & Plan notes found for this encounter.  Ms. CHAISE TEWALT has a current medication list which includes the following long-term medication(s): albuterol, diphenhydramine, furosemide, magnesium oxide, metoprolol tartrate, montelukast, oxycodone, rizatriptan, and rizatriptan.  Pharmacotherapy (Medications Ordered): No orders of the defined types were placed in this encounter.  Orders:  No orders of the defined types were placed in this encounter.  Follow-up plan:   No follow-ups on file.      Interventional Therapies  Risk  Complexity Considerations:   Estimated body mass index is 60.59 kg/m as calculated from the following:   Height as of 06/18/21: '5\' 4"'$  (1.626 m).   Weight as of 06/18/21: 353 lb (160.1 kg).     NOTE: NO RFA until BMI less or equal to 35.  (Gastric bypass done on 06/03/2017) Iodine allergy  Contrast  dye allergy  Shellfish allergy    Planned  Pending:      Under consideration:   Diagnostic bilateral genicular NB    Completed:   Therapeutic right L2-3 LESI x1 (04/29/2022) (100/100/60/60)  Therapeutic bilateral IA Monovisc knee inj. x1 (05/25/2022)  Therapeutic right trapezius TPI/MNB x1 (05/25/2022)  Therapeutic bilateral IA Zilretta knee inj. x4 (03/25/2022) (100/100/80/80)  Therapeutic bilateral IA Hyalgan knee inj. x16 (06/24/2020) (100/100/60/60)  Palliative/therapeutic bilateral lumbar facet MBB x8 (08/25/2021) (100/100/60/60)  Therapeutic right IA hip injection x3 (07/07/2017) (100/50/25/>50)  Therapeutic left IA shoulder (glenohumeral joint) injection x1 (05/31/2017) (50/0/100/100)    Completed by other providers:   Therapeutic bilateral THR (total hip replacements) (Dr. Rudene Christians) (Right: 11/27/2019) (Left: 07/29/2015)    Therapeutic  Palliative (PRN) options:   Palliative bilateral knee injections  Palliative bilateral lumbar facet MBBs    Pharmacotherapy:  Nonopioid transferred 06/24/2020: Lyrica Recommendations:   None at this time.       Recent Visits Date Type Provider Dept  08/23/22 Office Visit Milinda Pointer, MD [facility]  Showing recent visits within past 90 days and meeting all other requirements Future Appointments Date Type Provider Dept  11/17/22 Appointment  Milinda Pointer, MD [facility]  Showing future appointments within next 90 days and meeting all other requirements  I discussed the assessment and treatment plan with the patient. The patient was provided an opportunity to ask questions and all were answered. The patient agreed with the plan and demonstrated an understanding of the instructions.  Patient advised to call back or seek an in-person evaluation if the symptoms or condition worsens.  Duration of encounter: *** minutes.  Total time on encounter, as per AMA guidelines included both the face-to-face and  non-face-to-face time personally spent by the physician and/or other qualified health care professional(s) on the day of the encounter (includes time in activities that require the physician or other qualified health care professional and does not include time in activities normally performed by clinical staff). Physician's time may include the following activities when performed: Preparing to see the patient (e.g., pre-charting review of records, searching for previously ordered imaging, lab work, and nerve conduction tests) Review of prior analgesic pharmacotherapies. Reviewing PMP Interpreting ordered tests (e.g., lab work, imaging, nerve conduction tests) Performing post-procedure evaluations, including interpretation of diagnostic procedures Obtaining and/or reviewing separately obtained history Performing a medically appropriate examination and/or evaluation Counseling and educating the patient/family/caregiver Ordering medications, tests, or procedures Referring and communicating with other health care professionals (when not separately reported) Documenting clinical information in the electronic or other health record Independently interpreting results (not separately reported) and communicating results to the patient/ family/caregiver Care coordination (not separately reported)  Note by: Gaspar Cola, MD Date: 11/17/2022; Time: 12:58 PM

## 2022-11-17 ENCOUNTER — Ambulatory Visit (HOSPITAL_BASED_OUTPATIENT_CLINIC_OR_DEPARTMENT_OTHER): Payer: Medicare HMO | Admitting: Pain Medicine

## 2022-11-17 DIAGNOSIS — G894 Chronic pain syndrome: Secondary | ICD-10-CM

## 2022-11-17 DIAGNOSIS — M153 Secondary multiple arthritis: Secondary | ICD-10-CM

## 2022-11-17 DIAGNOSIS — B3731 Acute candidiasis of vulva and vagina: Secondary | ICD-10-CM | POA: Diagnosis not present

## 2022-11-17 DIAGNOSIS — Z79899 Other long term (current) drug therapy: Secondary | ICD-10-CM

## 2022-11-17 DIAGNOSIS — Z03818 Encounter for observation for suspected exposure to other biological agents ruled out: Secondary | ICD-10-CM | POA: Diagnosis not present

## 2022-11-17 DIAGNOSIS — M545 Other chronic pain: Secondary | ICD-10-CM

## 2022-11-17 DIAGNOSIS — Z91199 Patient's noncompliance with other medical treatment and regimen due to unspecified reason: Secondary | ICD-10-CM

## 2022-11-17 DIAGNOSIS — B9689 Other specified bacterial agents as the cause of diseases classified elsewhere: Secondary | ICD-10-CM | POA: Diagnosis not present

## 2022-11-17 DIAGNOSIS — J019 Acute sinusitis, unspecified: Secondary | ICD-10-CM | POA: Diagnosis not present

## 2022-11-17 DIAGNOSIS — Z79891 Long term (current) use of opiate analgesic: Secondary | ICD-10-CM

## 2022-11-17 DIAGNOSIS — G8929 Other chronic pain: Secondary | ICD-10-CM

## 2022-11-17 DIAGNOSIS — J209 Acute bronchitis, unspecified: Secondary | ICD-10-CM | POA: Diagnosis not present

## 2022-11-22 NOTE — Progress Notes (Unsigned)
PROVIDER NOTE: Information contained herein reflects review and annotations entered in association with encounter. Interpretation of such information and data should be left to medically-trained personnel. Information provided to patient can be located elsewhere in the medical record under "Patient Instructions". Document created using STT-dictation technology, any transcriptional errors that may result from process are unintentional.    Patient: Karen Lewis  Service Category: E/M  Provider: Gaspar Cola, MD  DOB: 1971/07/30  DOS: 11/24/2022  Referring Provider: Idelle Crouch, MD  MRN: [id number]  Specialty: Interventional Pain Management  PCP: Idelle Crouch, MD  Type: Established Patient  Setting: Ambulatory outpatient    Location: Office  Delivery: Face-to-face     HPI  [name], a 52 y.o. year old female, is here today because of her No primary diagnosis found.. Karen Lewis primary complain today is No chief complaint on file.  Pertinent problems: Karen Lewis has Fibromyalgia; Chronic knee pain (1ry area of Pain) (Bilateral) (R>L); Chronic low back pain (3ry area of Pain) (Bilateral) (R>L) w/o sciatica; Lumbar facet syndrome (Bilateral) (R>L); Secondary osteoarthritis of multiple sites; Grade 1  Anterolisthesis of L3/L4 & L4/L5 (1.4 cm); Chronic hip pain (2ry area of Pain) (Right); S/P THR (total hip replacement) (Left); Lumbar spondylosis; Chronic pain syndrome; Neurogenic pain; Upper extremity pain; Chronic shoulder pain (Left); Osteoarthritis of shoulder (Left); Osteoarthritis of hip (Right); Secondary Osteoarthritis of knee (Bilateral) (R>L); Spondylosis without myelopathy or radiculopathy, lumbosacral region; Panniculitis; Leg swelling; Edema of both legs; History of total hip replacement (Bilateral); S/P THR (total hip replacement) (Right) (11/27/2019); Pain and numbness of left upper extremity; Cervical radiculitis (Left); Bilateral primary osteoarthritis of knee; DDD  (degenerative disc disease), lumbosacral; Osteoarthritis of lumbar spine without myelopathy or radiculopathy; Lumbosacral radiculopathy at S1 (Left); Tricompartment osteoarthritis of knees (Bilateral); Lumbosacral radiculopathy at L2 (Right); Abnormal MRI, lumbar spine (11/10/2021); Chronic thigh pain (Right); Weakness of leg (Right); Lumbar interspinous bursitis (L2-L5); Spinal enthesopathy of lumbar region [facility]); Lumbar facet arthropathy (Multilevel) (Bilateral); Lumbar central spinal stenosis, w/o claudication (L2-3, L4-5, L5-S1); Lumbar foraminal stenosis (Bilateral: L2-3, L4-5) (L>R); Lumbosacral lateral recess stenosis (Bilateral: L4-5, L5-S1); and Trigger point of right shoulder region on their pertinent problem list. Pain Assessment: Severity of   is reported as a  /10. Location:    / . Onset:  . Quality:  . Timing:  . Modifying factor(s):  Marland Kitchen Vitals:  vitals were not taken for this visit.  BMI: Estimated body mass index is 64.13 kg/m as calculated from the following:   Height as of 10/21/22: '5\' 3"'$  (1.6 m).   Weight as of 10/21/22: 362 lb (164.2 kg). Last encounter: 11/17/2022. Last procedure: 05/25/2022.  Reason for encounter:  *** . ***  Pharmacotherapy Assessment  Analgesic: Oxycodone IR 5 mg 1 tab PO 3X/day (#90/mo) (15 mg/day) MME/day: 22.5 mg/day.   Monitoring: [place] PMP: PDMP reviewed during this encounter.       Pharmacotherapy: No side-effects or adverse reactions reported. Compliance: No problems identified. Effectiveness: Clinically acceptable.  No notes on file  No results found for: "CBDTHCR" No results found for: "D8THCCBX" No results found for: "D9THCCBX"  UDS:  Summary  Date Value Ref Range Status  10/26/2021 Note  Final    Comment:    ==================================================================== [employer] Select 13 (MW) ==================================================================== Test                             Result       Flag  Units  Drug  Present and Declared for Prescription Verification   7-aminoclonazepam              48           EXPECTED   ng/mg creat    7-aminoclonazepam is an expected metabolite of clonazepam. Source of    clonazepam is a scheduled prescription medication.    Noroxycodone                   518          EXPECTED   ng/mg creat    Noroxycodone is an expected metabolite of oxycodone. Sources of    oxycodone include scheduled prescription medications.  Drug Absent but Declared for Prescription Verification   Oxycodone                      Not Detected UNEXPECTED ng/mg creat    Oxycodone is almost always present in patients taking this drug    consistently.  Absence of oxycodone could be due to lapse of time    since the last dose or unusual pharmacokinetics (rapid metabolism).    Butalbital                     Not Detected UNEXPECTED ==================================================================== Test                      Result    Flag   Units      Ref Range   Creatinine              88               mg/dL      >=20 ==================================================================== Declared Medications:  The flagging and interpretation on this report are based on the  following declared medications.  Unexpected results may arise from  inaccuracies in the declared medications.   **Note: The testing scope of this panel includes these medications:   Butalbital (Fioricet)  Clonazepam (Klonopin)  Oxycodone   **Note: The testing scope of this panel does not include the  following reported medications:   Acetaminophen (Tylenol)  Acetaminophen (Fioricet)  Albuterol (Ventolin HFA)  Amitriptyline (Elavil)  Benzonatate (Tessalon)  Biotin  Caffeine (Fioricet)  Calcium (Tums)  Diclofenac (Voltaren)  Diphenhydramine (Benadryl)  Dupilumab (Dupixent)  Epinephrine  Eye Drop  Famotidine (Pepcid)  Fluticasone (Breo)  Furosemide (Lasix)  Guaifenesin (Robitussin)  Levocetirizine (Xyzal)   Levothyroxine (Synthroid)  Lisinopril (Zestril)  Magnesium (Mag-Ox)  Meclizine (Antivert)  Metformin (Glucophage)  Metoprolol (Lopressor)  Montelukast (Singulair)  Multivitamin  Naloxone (Narcan)  Naproxen (Aleve)  Nitrofurantoin (Macrobid)  Omeprazole (Prilosec)  Pregabalin (Lyrica)  Probiotic  Quetiapine (Seroquel)  Rizatriptan (Maxalt)  Rosuvastatin (Crestor)  Semaglutide (Ozempic)  Spironolactone (Aldactone)  Tizanidine (Zanaflex)  Vilanterol (Breo)  Vitamin B12  Vitamin C  Vitamin D2  Zolpidem (Ambien) ==================================================================== For clinical consultation, please call [phone]. ====================================================================       ROS  Constitutional: Denies any fever or chills Gastrointestinal: No reported hemesis, hematochezia, vomiting, or acute GI distress Musculoskeletal: Denies any acute onset joint swelling, redness, loss of ROM, or weakness Neurological: No reported episodes of acute onset apraxia, aphasia, dysarthria, agnosia, amnesia, paralysis, loss of coordination, or loss of consciousness  Medication Review  B-12, Biotin, DULoxetine, EPINEPHrine, Galcanezumab-gnlm, Semaglutide(0.25 or 0.'5MG'$ /DOS), Tab-A-Vite, acetaminophen, acidophilus, albuterol, amitriptyline, butalbital-acetaminophen-caffeine, calcium carbonate, clonazePAM, diclofenac sodium, diphenhydrAMINE, ergocalciferol, famotidine, fluticasone furoate-vilanterol, furosemide, glucose blood, levocetirizine, levothyroxine, magnesium oxide, meclizine, metFORMIN,  metoprolol tartrate, montelukast, naloxone, naphazoline-pheniramine, nitrofurantoin (macrocrystal-monohydrate), omeprazole, oxyCODONE, rizatriptan, rosuvastatin, tiZANidine, topiramate, and vitamin C  History Review  Allergy: Ms. Kuehl is allergic to bactrim [sulfamethoxazole-trimethoprim], omalizumab, ciprofloxacin, shellfish allergy, and nsaids. Drug: Ms. Shawver  reports no  history of drug use. Alcohol:  reports no history of alcohol use. Tobacco:  reports that she has never smoked. She has never used smokeless tobacco. Social: Ms. Mormando  reports that she has never smoked. She has never used smokeless tobacco. She reports that she does not drink alcohol and does not use drugs. Medical:  has a past medical history of Anemia, Anginal pain ([place]), Anxiety, Arthralgia of hip (07/29/2015), Arthritis, Arthritis, degenerative (07/29/2015), Asthma, Cephalalgia (07/25/2014), Dependence on unknown drug ([place]), Depression, Diabetes mellitus without complication ([place]), Difficult intubation, Dysrhythmia, Eczema, Fibromyalgia, Gastritis, GERD (gastroesophageal reflux disease), Gonalgia (07/29/2015), Gout, H/O cardiovascular disorder (03/10/2015), H/O surgical procedure (12/05/2012), H/O thyroid disease (03/10/2015), Headache, Herpes, History of artificial joint (07/29/2015), History of hiatal hernia, Hypertension, Hypomagnesemia, Hypothyroidism, IDA (iron deficiency anemia) (05/28/2019), LBP (low back pain) (07/29/2015), Neuromuscular disorder ([place]), Obesity, PCOS (polycystic ovarian syndrome), Primary osteoarthritis of both knees (07/29/2015), Sleep apnea, Thyroid nodule (bilateral), and Umbilical hernia. Surgical: Ms. Deubel  has a past surgical history that includes Laparoscopic partial gastrectomy; Shoulder arthroscopy (Right); Carpal tunnel release (Bilateral); Diagnostic laparoscopy; Cholecystectomy; Trigger finger release (Right); Thyroidectomy (N/A, 11/12/2015); left trigger finger; Roux-en-Y Gastric Bypass (06/03/2017); Hiatal hernia repair; peniculectomy (N/A, 07/05/2018); Total hip arthroplasty (Right, 11/27/2019); Joint replacement (Bilateral, hip); Appendectomy; Trigger finger release (Right, 04/24/2020); Colonoscopy with propofol (N/A, 02/11/2021); and Esophagogastroduodenoscopy (egd) with propofol (N/A, 02/11/2021). Family: family history includes Alcohol abuse in her father and mother; Anxiety  disorder in her father and mother; Brain cancer (age of onset: 36) in her paternal uncle; Breast cancer (age of onset: 64) in her paternal aunt and paternal aunt; COPD in her father; Depression in her brother, father, and mother; Diabetes in her brother, father, and mother; Hypertension in her brother, father, and mother; Kidney cancer in her mother; Kidney failure in her father; Post-traumatic stress disorder in her father; Sleep apnea in her brother, father, and mother.  Laboratory Chemistry Profile   Renal Lab Results  Component Value Date   BUN 17 08/09/2022   CREATININE 1.04 (H) 08/09/2022   GFRAA >60 04/16/2020   GFRNONAA >60 08/09/2022    Hepatic Lab Results  Component Value Date   AST 25 08/07/2022   ALT 40 08/07/2022   ALBUMIN 3.5 08/07/2022   ALKPHOS 74 08/07/2022   LIPASE 37 08/07/2022    Electrolytes Lab Results  Component Value Date   NA 142 08/09/2022   K 4.8 08/09/2022   CL 116 (H) 08/09/2022   CALCIUM 7.8 (L) 08/09/2022   MG 2.5 (H) 08/07/2022   PHOS 5.5 (H) 05/22/2019    Bone Lab Results  Component Value Date   VD25OH 27.4 (L) 11/24/2015   VD125OH2TOT 41.5 11/24/2015    Inflammation (CRP: Acute Phase) (ESR: Chronic Phase) Lab Results  Component Value Date   CRP 0.7 09/27/2019   ESRSEDRATE 30 (H) 09/27/2019   LATICACIDVEN 1.7 08/07/2022         Note: Above Lab results reviewed.  Recent Imaging Review  NM Myocar Multi W/Spect W/Wall Motion / EF   The study is normal. The study is low risk.   No ST deviation was noted.   LV perfusion is normal. There is no evidence of ischemia. There is no  evidence of infarction.   Left ventricular function is normal.  Nuclear stress EF: 65 %. The left  ventricular ejection fraction is normal (55-65%). End diastolic cavity  size is normal.   Prior study not available for comparison.  Normal Myoview. No evidence of ischemia. This is a low risk study. Note: Reviewed        Physical Exam  General appearance:  Well nourished, well developed, and well hydrated. In no apparent acute distress Mental status: Alert, oriented x 3 (person, place, & time)       Respiratory: No evidence of acute respiratory distress Eyes: PERLA Vitals: There were no vitals taken for this visit. BMI: Estimated body mass index is 64.13 kg/m as calculated from the following:   Height as of 10/21/22: '5\' 3"'$  (1.6 m).   Weight as of 10/21/22: 362 lb (164.2 kg). Ideal: Patient weight not recorded  Assessment   Diagnosis Status  No diagnosis found. Controlled Controlled Controlled   Updated Problems: No problems updated.  Plan of Care  Problem-specific:  No problem-specific Assessment & Plan notes found for this encounter.  Ms. EMIYA NORCUTT has a current medication list which includes the following long-term medication(s): albuterol, diphenhydramine, furosemide, magnesium oxide, metoprolol tartrate, montelukast, oxycodone, rizatriptan, and rizatriptan.  Pharmacotherapy (Medications Ordered): No orders of the defined types were placed in this encounter.  Orders:  No orders of the defined types were placed in this encounter.  Follow-up plan:   No follow-ups on file.      Interventional Therapies  Risk  Complexity Considerations:   Estimated body mass index is 60.59 kg/m as calculated from the following:   Height as of 06/18/21: '5\' 4"'$  (1.626 m).   Weight as of 06/18/21: 353 lb (160.1 kg).     NOTE: NO RFA until BMI less or equal to 35.  (Gastric bypass done on 06/03/2017) Iodine allergy  Contrast dye allergy  Shellfish allergy    Planned  Pending:      Under consideration:   Diagnostic bilateral genicular NB    Completed:   Therapeutic right L2-3 LESI x1 (04/29/2022) (100/100/60/60)  Therapeutic bilateral IA Monovisc knee inj. x1 (05/25/2022)  Therapeutic right trapezius TPI/MNB x1 (05/25/2022)  Therapeutic bilateral IA Zilretta knee inj. x4 (03/25/2022) (100/100/80/80)  Therapeutic bilateral IA Hyalgan knee  inj. x16 (06/24/2020) (100/100/60/60)  Palliative/therapeutic bilateral lumbar facet MBB x8 (08/25/2021) (100/100/60/60)  Therapeutic right IA hip injection x3 (07/07/2017) (100/50/25/>50)  Therapeutic left IA shoulder (glenohumeral joint) injection x1 (05/31/2017) (50/0/100/100)    Completed by other providers:   Therapeutic bilateral THR (total hip replacements) (Dr. Rudene Christians) (Right: 11/27/2019) (Left: 07/29/2015)    Therapeutic  Palliative (PRN) options:   Palliative bilateral knee injections  Palliative bilateral lumbar facet MBBs    Pharmacotherapy:  Nonopioid transferred 06/24/2020: Lyrica Recommendations:   None at this time.       Recent Visits No visits were found meeting these conditions. Showing recent visits within past 90 days and meeting all other requirements Future Appointments Date Type Provider Dept  11/24/22 Appointment Milinda Pointer, MD [facility]  Showing future appointments within next 90 days and meeting all other requirements  I discussed the assessment and treatment plan with the patient. The patient was provided an opportunity to ask questions and all were answered. The patient agreed with the plan and demonstrated an understanding of the instructions.  Patient advised to call back or seek an in-person evaluation if the symptoms or condition worsens.  Duration of encounter: *** minutes.  Total time on encounter, as per AMA guidelines included both  the face-to-face and non-face-to-face time personally spent by the physician and/or other qualified health care professional(s) on the day of the encounter (includes time in activities that require the physician or other qualified health care professional and does not include time in activities normally performed by clinical staff). Physician's time may include the following activities when performed: Preparing to see the patient (e.g., pre-charting review of records, searching for previously ordered  imaging, lab work, and nerve conduction tests) Review of prior analgesic pharmacotherapies. Reviewing PMP Interpreting ordered tests (e.g., lab work, imaging, nerve conduction tests) Performing post-procedure evaluations, including interpretation of diagnostic procedures Obtaining and/or reviewing separately obtained history Performing a medically appropriate examination and/or evaluation Counseling and educating the patient/family/caregiver Ordering medications, tests, or procedures Referring and communicating with other health care professionals (when not separately reported) Documenting clinical information in the electronic or other health record Independently interpreting results (not separately reported) and communicating results to the patient/ family/caregiver Care coordination (not separately reported)  Note by: Gaspar Cola, MD Date: 11/24/2022; Time: 10:20 AM

## 2022-11-24 ENCOUNTER — Ambulatory Visit: Payer: Medicare HMO | Attending: Pain Medicine | Admitting: Pain Medicine

## 2022-11-24 ENCOUNTER — Inpatient Hospital Stay: Payer: Medicare HMO | Attending: Oncology | Admitting: Oncology

## 2022-11-24 ENCOUNTER — Other Ambulatory Visit: Payer: Medicare HMO

## 2022-11-24 ENCOUNTER — Encounter: Payer: Self-pay | Admitting: Pain Medicine

## 2022-11-24 ENCOUNTER — Encounter: Payer: Self-pay | Admitting: Oncology

## 2022-11-24 ENCOUNTER — Inpatient Hospital Stay: Payer: Medicare HMO

## 2022-11-24 VITALS — BP 117/60 | HR 85

## 2022-11-24 VITALS — BP 130/90 | HR 95 | Temp 96.4°F

## 2022-11-24 VITALS — BP 117/98 | HR 87 | Temp 97.1°F | Ht 63.0 in | Wt 344.0 lb

## 2022-11-24 DIAGNOSIS — M25561 Pain in right knee: Secondary | ICD-10-CM

## 2022-11-24 DIAGNOSIS — Z79899 Other long term (current) drug therapy: Secondary | ICD-10-CM | POA: Diagnosis not present

## 2022-11-24 DIAGNOSIS — J209 Acute bronchitis, unspecified: Secondary | ICD-10-CM | POA: Diagnosis not present

## 2022-11-24 DIAGNOSIS — I10 Essential (primary) hypertension: Secondary | ICD-10-CM | POA: Diagnosis not present

## 2022-11-24 DIAGNOSIS — D509 Iron deficiency anemia, unspecified: Secondary | ICD-10-CM | POA: Diagnosis not present

## 2022-11-24 DIAGNOSIS — D508 Other iron deficiency anemias: Secondary | ICD-10-CM

## 2022-11-24 DIAGNOSIS — M5137 Other intervertebral disc degeneration, lumbosacral region: Secondary | ICD-10-CM | POA: Diagnosis not present

## 2022-11-24 DIAGNOSIS — M153 Secondary multiple arthritis: Secondary | ICD-10-CM | POA: Diagnosis not present

## 2022-11-24 DIAGNOSIS — M47816 Spondylosis without myelopathy or radiculopathy, lumbar region: Secondary | ICD-10-CM | POA: Insufficient documentation

## 2022-11-24 DIAGNOSIS — M545 Low back pain, unspecified: Secondary | ICD-10-CM | POA: Insufficient documentation

## 2022-11-24 DIAGNOSIS — E119 Type 2 diabetes mellitus without complications: Secondary | ICD-10-CM | POA: Diagnosis not present

## 2022-11-24 DIAGNOSIS — G8929 Other chronic pain: Secondary | ICD-10-CM | POA: Insufficient documentation

## 2022-11-24 DIAGNOSIS — Z9884 Bariatric surgery status: Secondary | ICD-10-CM | POA: Insufficient documentation

## 2022-11-24 DIAGNOSIS — M25562 Pain in left knee: Secondary | ICD-10-CM | POA: Insufficient documentation

## 2022-11-24 DIAGNOSIS — Z79891 Long term (current) use of opiate analgesic: Secondary | ICD-10-CM | POA: Diagnosis not present

## 2022-11-24 DIAGNOSIS — M25551 Pain in right hip: Secondary | ICD-10-CM | POA: Insufficient documentation

## 2022-11-24 DIAGNOSIS — G894 Chronic pain syndrome: Secondary | ICD-10-CM | POA: Insufficient documentation

## 2022-11-24 LAB — CBC WITH DIFFERENTIAL/PLATELET
Abs Immature Granulocytes: 0.05 10*3/uL (ref 0.00–0.07)
Basophils Absolute: 0.1 10*3/uL (ref 0.0–0.1)
Basophils Relative: 0 %
Eosinophils Absolute: 0.1 10*3/uL (ref 0.0–0.5)
Eosinophils Relative: 1 %
HCT: 46.6 % — ABNORMAL HIGH (ref 36.0–46.0)
Hemoglobin: 14.6 g/dL (ref 12.0–15.0)
Immature Granulocytes: 0 %
Lymphocytes Relative: 28 %
Lymphs Abs: 4.2 10*3/uL — ABNORMAL HIGH (ref 0.7–4.0)
MCH: 27.8 pg (ref 26.0–34.0)
MCHC: 31.3 g/dL (ref 30.0–36.0)
MCV: 88.6 fL (ref 80.0–100.0)
Monocytes Absolute: 0.5 10*3/uL (ref 0.1–1.0)
Monocytes Relative: 3 %
Neutro Abs: 9.9 10*3/uL — ABNORMAL HIGH (ref 1.7–7.7)
Neutrophils Relative %: 68 %
Platelets: 354 10*3/uL (ref 150–400)
RBC: 5.26 MIL/uL — ABNORMAL HIGH (ref 3.87–5.11)
RDW: 13.3 % (ref 11.5–15.5)
WBC: 14.8 10*3/uL — ABNORMAL HIGH (ref 4.0–10.5)
nRBC: 0 % (ref 0.0–0.2)

## 2022-11-24 LAB — IRON AND TIBC
Iron: 67 ug/dL (ref 28–170)
Saturation Ratios: 15 % (ref 10.4–31.8)
TIBC: 448 ug/dL (ref 250–450)
UIBC: 381 ug/dL

## 2022-11-24 LAB — FERRITIN: Ferritin: 41 ng/mL (ref 11–307)

## 2022-11-24 LAB — VITAMIN B12: Vitamin B-12: 543 pg/mL (ref 180–914)

## 2022-11-24 MED ORDER — SODIUM CHLORIDE 0.9 % IV SOLN
Freq: Once | INTRAVENOUS | Status: AC
  Filled 2022-11-24: qty 250

## 2022-11-24 MED ORDER — OXYCODONE HCL 5 MG PO TABS: 5.0000 mg | ORAL_TABLET | Freq: Three times a day (TID) | ORAL | 0 refills | Status: DC | PRN

## 2022-11-24 MED ORDER — SODIUM CHLORIDE 0.9% FLUSH
10.0000 mL | Freq: Once | INTRAVENOUS | Status: AC | PRN
  Administered 2022-11-24: 10 mL
  Filled 2022-11-24: qty 10

## 2022-11-24 MED ORDER — SODIUM CHLORIDE 0.9 % IV SOLN
200.0000 mg | Freq: Once | INTRAVENOUS | Status: AC
  Administered 2022-11-24: 200 mg via INTRAVENOUS
  Filled 2022-11-24: qty 10

## 2022-11-24 NOTE — Progress Notes (Signed)
Patient tolerated Venofer infusion well. Explained recommendation of 30 min post monitoring. Patient refused to wait post monitoring. Educated on what signs to watch for & to call with any concerns. No questions, discharged. Stable  

## 2022-11-24 NOTE — Progress Notes (Signed)
Hematology/Oncology Progress note [phone] [phone]    CHIEF COMPLAINTS/REASON FOR VISIT:  Follow up  iron deficiency anemia  ASSESSMENT & PLAN:   IDA (iron deficiency anemia) #Iron deficiency anemia  Labs reviewed and discussed with patient Patient has stable hemoglobin and iron panel. Proceed with maintenance Venofer 200 mg x 1 today.  History of gastric bypass Recommend patient to continue vitamin B12 supplementation 1000 mcg daily.   Orders Placed This Encounter  Procedures   CBC with Differential ([facility] Only)    Standing Status:   Future    Standing Expiration Date:   11/24/2023   Iron and TIBC    Standing Status:   Future    Standing Expiration Date:   11/24/2023   Ferritin    Standing Status:   Future    Standing Expiration Date:   11/24/2023   Vitamin B12    Standing Status:   Future    Standing Expiration Date:   11/24/2023   Follow-up in 6 months. All questions were answered. The patient knows to call the clinic with any problems, questions or concerns.  Earlie Server, MD, PhD [facility] Hematology Oncology 11/24/2022    HISTORY OF PRESENTING ILLNESS:  Karen Lewis is a  52 y.o.  female with PMH listed below was seen in consultation at the request of Idelle Crouch, MD   for evaluation of iron deficiency anemia.   Reviewed patient's recent labs  Labs revealed anemia with hemoglobin of .   Reviewed patient's previous labs  05/16/2019 CBC showed hemoglobin 9.4, WBC 11.4, MCV 68.7, platelet count 352,000. Iron panel showed ferritin of 8, iron 34, folate 19.3, No aggravating or improving factors.  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  She follows up with ID Dr. Tama High.  +Chronic lower extremity swelling/lymphedema. Moved from [place] years ago.  Previously worked as a Marine scientist.  INTERVAL HISTORY Karen Lewis is a 52 y.o. female who has above  history reviewed by me today presents for follow up visit for management of iron deficiency anemia Patient receives maintenance Venofer every 6 months. Patient has chronic fatigue.  No blood in the stool, abdominal pain.   Review of Systems  Constitutional:  Positive for fatigue. Negative for appetite change, chills and fever.  HENT:   Negative for hearing loss and voice change.   Eyes:  Negative for eye problems.  Respiratory:  Negative for chest tightness, cough and shortness of breath.   Cardiovascular:  Positive for leg swelling. Negative for chest pain.  Gastrointestinal:  Negative for abdominal distention, abdominal pain and blood in stool.  Endocrine: Negative for hot flashes.  Genitourinary:  Negative for difficulty urinating and frequency.   Musculoskeletal:  Positive for arthralgias and back pain.  Skin:  Negative for itching and rash.  Neurological:  Negative for extremity weakness.  Hematological:  Negative for adenopathy.  Psychiatric/Behavioral:  Negative for confusion.     MEDICAL HISTORY:  Past Medical History:  Diagnosis Date   Anemia    Anginal pain (HCC)    Anxiety    Arthralgia of hip 07/29/2015   Arthritis    Arthritis, degenerative 07/29/2015   Asthma    Cephalalgia 07/25/2014   Dependence on unknown drug ([place])    multiplt controlled drug dependence   Depression    Diabetes mellitus without complication ([place])    Difficult intubation    per pt needs small tube (has had  abrasions from tube in past)   Dysrhythmia    PVC's   Eczema    Fibromyalgia    Gastritis    GERD (gastroesophageal reflux disease)    Gonalgia 07/29/2015   Overview:  Overview:  The patient has had bilateral intra-articular Hyalgan injections done on 07/16/2014 and although she seems to do well with this type of therapy, apparently her insurance company does not want to pay for they Hyalgan. On 11/27/2014 the patient underwent a bilateral genicular nerve block with excellent results.  On 01/28/2015 she had a right knee genicular radiofrequency ablatio   Gout    H/O cardiovascular disorder 03/10/2015   H/O surgical procedure 12/05/2012   Overview:  LSG ([facility] - April 2013)     H/O thyroid disease 03/10/2015   Headache    Herpes    History of artificial joint 07/29/2015   History of hiatal hernia    Hypertension    Hypomagnesemia    Hypothyroidism    IDA (iron deficiency anemia) 05/28/2019   LBP (low back pain) 07/29/2015   Neuromuscular disorder (HCC)    Obesity    PCOS (polycystic ovarian syndrome)    Primary osteoarthritis of both knees 07/29/2015   Sleep apnea    not using a Cpap machine at this time - most recent test mild apnea does not qualify for test (not OSA)   Thyroid nodule bilateral   Umbilical hernia     SURGICAL HISTORY: Past Surgical History:  Procedure Laterality Date   APPENDECTOMY     CARPAL TUNNEL RELEASE Bilateral    CHOLECYSTECTOMY     COLONOSCOPY WITH PROPOFOL N/A 02/11/2021   Procedure: COLONOSCOPY WITH PROPOFOL;  Surgeon: Toledo, Benay Pike, MD;  Location: [facility] ENDOSCOPY;  Service: Gastroenterology;  Laterality: N/A;   DIAGNOSTIC LAPAROSCOPY     ESOPHAGOGASTRODUODENOSCOPY (EGD) WITH PROPOFOL N/A 02/11/2021   Procedure: ESOPHAGOGASTRODUODENOSCOPY (EGD) WITH PROPOFOL;  Surgeon: Toledo, Benay Pike, MD;  Location: [facility] END[facility]COPY;  Service: Gastroenterology;  Laterality: N/A;   HIATAL HERNIA REPAIR     JOINT REPLACEMENT Bilateral hip   LAPAROSCOPIC PARTIAL GASTRECTOMY     left trigger finger     peniculectomy N/A 07/05/2018   ROUX-EN-Y GASTRIC BYPASS  06/03/2017   SHOULDER ARTHROSCOPY Right    THYROIDECTOMY N/A 11/12/2015   Procedure: THYROIDECTOMY;  Surgeon: Clyde Canterbury, MD;  Location: [facility] ORS;  Service: ENT;  Laterality: N/A;   TOTAL HIP ARTHROPLASTY Right 11/27/2019   Procedure: TOTAL HIP ARTHROPLASTY ANTERIOR APPROACH;  Surgeon: Hessie Knows, MD;  Location: [facility] ORS;  Service: Orthopedics;  Laterality: Right;   TRIGGER FINGER RELEASE Right     TRIGGER FINGER RELEASE Right 04/24/2020   Procedure: Right ring and middle trigger release;  Surgeon: Hessie Knows, MD;  Location: [facility] ORS;  Service: Orthopedics;  Laterality: Right;    SOCIAL HISTORY: Social History   Socioeconomic History   Marital status: Married    Spouse name: Not on file   Number of children: 0   Years of education: 12   Highest education level: High school graduate  Occupational History   Occupation: Disabled  Tobacco Use   Smoking status: Never   Smokeless tobacco: Never  Vaping Use   Vaping Use: Never used  Substance and Sexual Activity   Alcohol use: No    Alcohol/week: 0.0 standard drinks of alcohol   Drug use: No   Sexual activity: Not Currently    Birth control/protection: Injection  Other Topics Concern   Not on file  Social History Narrative  Not on file   Social Determinants of Health   Financial Resource Strain: Low Risk  (12/14/2019)   Overall Financial Resource Strain (CARDIA)    Difficulty of Paying Living Expenses: Not hard at all  Food Insecurity: No Food Insecurity (06/18/2021)   Hunger Vital Sign    Worried About Running Out of Food in the Last Year: Never true    Ran Out of Food in the Last Year: Never true  Transportation Needs: No Transportation Needs (12/14/2019)   PRAPARE - Hydrologist (Medical): No    Lack of Transportation (Non-Medical): No  Physical Activity: Inactive (12/14/2019)   Exercise Vital Sign    Days of Exercise per Week: 0 days    Minutes of Exercise per Session: 0 min  Stress: No Stress Concern Present (01/20/2022)   [place]    Feeling of Stress : Only a little  Social Connections: Moderately Integrated (01/20/2022)   Social Connection and Isolation Panel [NHANES]    Frequency of Communication with Friends and Family: More than three times a week    Frequency of Social Gatherings with Friends and Family: More  than three times a week    Attends Religious Services: More than 4 times per year    Active Member of [employer] or Organizations: No    Attends Archivist Meetings: Never    Marital Status: Married  Human resources officer Violence: Not At Risk (12/14/2019)   Humiliation, Afraid, Rape, and Kick questionnaire    Fear of Current or Ex-Partner: No    Emotionally Abused: No    Physically Abused: No    Sexually Abused: No    FAMILY HISTORY: Family History  Problem Relation Age of Onset   Anxiety disorder Mother    Depression Mother    Alcohol abuse Mother    Diabetes Mother    Hypertension Mother    Kidney cancer Mother    Sleep apnea Mother    Alcohol abuse Father    Anxiety disorder Father    Depression Father    Post-traumatic stress disorder Father    Kidney failure Father    COPD Father    Diabetes Father    Hypertension Father    Sleep apnea Father    Depression Brother    Diabetes Brother    Hypertension Brother    Sleep apnea Brother    Breast cancer Paternal Aunt 48   Breast cancer Paternal Aunt 78   Brain cancer Paternal Uncle 76   Bladder Cancer Neg Hx    Prostate cancer Neg Hx     ALLERGIES:  is allergic to bactrim [sulfamethoxazole-trimethoprim], omalizumab, ciprofloxacin, shellfish allergy, and nsaids.  MEDICATIONS:  Current Outpatient Medications  Medication Sig Dispense Refill   ACCU-CHEK AVIVA PLUS test strip      acetaminophen (TYLENOL) 650 MG CR tablet Take 650-1,300 mg by mouth every 8 (eight) hours as needed for pain.     albuterol (PROVENTIL HFA;VENTOLIN HFA) 108 (90 BASE) MCG/ACT inhaler Inhale 2 puffs into the lungs every 6 (six) hours as needed for wheezing or shortness of breath.     amitriptyline (ELAVIL) 75 MG tablet Take 100 mg by mouth at bedtime.     Ascorbic Acid (VITAMIN C) 1000 MG tablet Take 1,000 mg by mouth daily.      Biotin 10 MG CAPS Take 10 mg by mouth daily.      butalbital-acetaminophen-caffeine (FIORICET) 50-325-40 MG tablet  Take 1  tablet by mouth every 6 (six) hours as needed for migraine.      calcium carbonate (TUMS EX) 750 MG chewable tablet Chew 1 tablet by mouth daily.      clonazePAM (KLONOPIN) 0.5 MG tablet Take 0.5 mg by mouth 2 (two) times daily as needed for anxiety.     Cyanocobalamin (B-12) 2500 MCG TABS Take 2,500 mcg by mouth daily.     diclofenac sodium (VOLTAREN) 1 % GEL Apply 2 g topically 4 (four) times daily. (Patient taking differently: Apply 2 g topically 3 (three) times daily as needed (pain).) 100 g 1   diphenhydrAMINE (BENADRYL) 25 MG tablet Take 25 mg by mouth every 8 (eight) hours as needed for itching or allergies.      DULoxetine (CYMBALTA) 60 MG capsule Take 60 mg by mouth daily.     EPINEPHrine 0.3 mg/0.3 mL IJ SOAJ injection Inject 0.3 mg into the muscle as needed for anaphylaxis.     ergocalciferol (VITAMIN D2) 1.25 MG (50000 UT) capsule Take 50,000 Units by mouth 2 (two) times a week.      famotidine (PEPCID) 20 MG tablet Take 20 mg by mouth at bedtime.     fluticasone furoate-vilanterol (BREO ELLIPTA) 200-25 MCG/INH AEPB Inhale 1 puff into the lungs daily.     Galcanezumab-gnlm (EMGALITY) 120 MG/ML SOAJ Inject 120 mg into the skin every 30 (thirty) days.     glucose blood (ACCU-CHEK AVIVA PLUS) test strip Use 2 (two) times daily     levocetirizine (XYZAL) 5 MG tablet Take 5 mg by mouth at bedtime.      levothyroxine (SYNTHROID) 175 MCG tablet Take 175 mcg by mouth daily before breakfast.     magnesium oxide (MAG-OX) 400 MG tablet Take 1,200 mg by mouth 2 (two) times daily. Take 1200 mg by mouth in the morning and 1200 mg at bedtime     meclizine (ANTIVERT) 25 MG tablet Take 25 mg by mouth 3 (three) times daily as needed for dizziness.      metFORMIN (GLUCOPHAGE-XR) 500 MG 24 hr tablet Take 1,000 mg by mouth 2 (two) times daily.     montelukast (SINGULAIR) 10 MG tablet Take 10 mg by mouth at bedtime.      Multiple Vitamin (TAB-A-VITE) TABS Take 1 tablet by mouth daily.     naloxone  (NARCAN) nasal spray 4 mg/0.1 mL Place 1 spray into the nose as needed for up to 365 doses (for opioid-induced respiratory depresssion). In case of emergency (overdose), spray once into each nostril. If no response within 3 minutes, repeat application and call [id number]. 1 each 0   naphazoline-pheniramine (NAPHCON-A) 0.025-0.3 % ophthalmic solution Place 1 drop into both eyes 4 (four) times daily as needed for eye irritation.     omeprazole (PRILOSEC) 40 MG capsule Take 40 mg by mouth in the morning and at bedtime.     [START ON 11/29/2022] oxyCODONE (OXY IR/ROXICODONE) 5 MG immediate release tablet Take 1 tablet (5 mg total) by mouth every 8 (eight) hours as needed for severe pain. Must last 30 days 90 tablet 0   [START ON 12/29/2022] oxyCODONE (OXY IR/ROXICODONE) 5 MG immediate release tablet Take 1 tablet (5 mg total) by mouth every 8 (eight) hours as needed for severe pain. Must last 30 days 90 tablet 0   [START ON 01/28/2023] oxyCODONE (OXY IR/ROXICODONE) 5 MG immediate release tablet Take 1 tablet (5 mg total) by mouth every 8 (eight) hours as needed for severe pain. Must last 30 days  90 tablet 0   OZEMPIC, 0.25 OR 0.5 MG/DOSE, 2 MG/1.5ML SOPN Inject 1 mg into the skin once a week.     predniSONE (DELTASONE) 20 MG tablet Take 20 mg by mouth daily.     rizatriptan (MAXALT) 10 MG tablet Take 1 and may repeat in 2 hours for MIgraines, alternate with the disintrgrating tablet.     rizatriptan (MAXALT-MLT) 10 MG disintegrating tablet Take 10 mg by mouth. Take 1 and may repeat in 2 hours  for Migraines     rosuvastatin (CRESTOR) 40 MG tablet Take 40 mg by mouth daily.      tiZANidine (ZANAFLEX) 4 MG tablet Take 1 tablet (4 mg total) by mouth 2 (two) times daily as needed for muscle spasms. 30 tablet 0   topiramate (TOPAMAX) 50 MG tablet Take 100 mg by mouth at bedtime.     acidophilus (RISAQUAD) CAPS capsule Take 1 capsule by mouth daily.     nitrofurantoin, macrocrystal-monohydrate, (MACROBID) 100 MG capsule  Take 1 capsule (100 mg total) by mouth daily. 30 capsule 11   No current facility-administered medications for this visit.     PHYSICAL EXAMINATION: ECOG PERFORMANCE STATUS: 1 - Symptomatic but completely ambulatory Vitals:   11/24/22 1308  BP: (!) 130/90  Pulse: 95  Temp: (!) 96.4 F (35.8 C)  SpO2: 97%   There were no vitals filed for this visit.   Physical Exam Constitutional:      General: She is not in acute distress.    Appearance: She is obese.  HENT:     Head: Normocephalic and atraumatic.  Eyes:     General: No scleral icterus.    Pupils: Pupils are equal, round, and reactive to light.  Cardiovascular:     Rate and Rhythm: Normal rate and regular rhythm.  Pulmonary:     Effort: Pulmonary effort is normal. No respiratory distress.     Breath sounds: No wheezing.  Abdominal:     General: Bowel sounds are normal.     Palpations: Abdomen is soft.  Musculoskeletal:        General: Normal range of motion.     Cervical back: Normal range of motion and neck supple.  Skin:    General: Skin is warm and dry.  Neurological:     Mental Status: She is alert and oriented to person, place, and time. Mental status is at baseline.     Cranial Nerves: No cranial nerve deficit.  Psychiatric:        Mood and Affect: Mood normal.     LABORATORY DATA:  I have reviewed the data as listed    Latest Ref Rng & Units 11/24/2022   10:08 AM 08/08/2022    3:51 AM 08/07/2022    3:37 PM  CBC  WBC 4.0 - 10.5 K/uL 14.8  7.3  9.6   Hemoglobin 12.0 - 15.0 g/dL 14.6  11.3  11.4   Hematocrit 36.0 - 46.0 % 46.6  34.7  35.7   Platelets 150 - 400 K/uL 354  201  217       Latest Ref Rng & Units 08/09/2022    4:18 AM 08/08/2022    3:51 AM 08/07/2022    3:37 PM  CMP  Glucose 70 - 99 mg/dL 117  106  96   BUN 6 - 20 mg/dL 17  31  39   Creatinine 0.44 - 1.00 mg/dL 1.04  1.62  2.68   Sodium 135 - 145 mmol/L 142  140  135   Potassium 3.5 - 5.1 mmol/L 4.8  4.9  5.3   Chloride 98 - 111  mmol/L 116  113  108   CO2 22 - 32 mmol/L '21  19  18   '$ Calcium 8.9 - 10.3 mg/dL 7.8  7.3  7.3   Total Protein 6.5 - 8.1 g/dL   6.2   Total Bilirubin 0.3 - 1.2 mg/dL   0.5   Alkaline Phos 38 - 126 U/L   74   AST 15 - 41 U/L   25   ALT 0 - 44 U/L   40    Iron/TIBC/Ferritin/ %Sat    Component Value Date/Time   IRON 67 11/24/2022 1008   TIBC 448 11/24/2022 1008   FERRITIN 41 11/24/2022 1008   IRONPCTSAT 15 11/24/2022 1008     No results found.

## 2022-11-24 NOTE — Patient Instructions (Signed)
____________________________________________________________________________________________  Opioid Pain Medication Update  To: All patients taking opioid pain medications. (I.e.: hydrocodone, hydromorphone, oxycodone, oxymorphone, morphine, codeine, methadone, tapentadol, tramadol, buprenorphine, fentanyl, etc.)  Re: Updated review of side effects and adverse reactions of opioid analgesics, as well as new information about long term effects of this class of medications.  Direct risks of long-term opioid therapy are not limited to opioid addiction and overdose. Potential medical risks include serious fractures, breathing problems during sleep, hyperalgesia, immunosuppression, chronic constipation, bowel obstruction, myocardial infarction, and tooth decay secondary to xerostomia.  Unpredictable adverse effects that can occur even if you take your medication correctly: Cognitive impairment, respiratory depression, and death. Most people think that if they take their medication "correctly", and "as instructed", that they will be safe. Nothing could be farther from the truth. In reality, a significant amount of recorded deaths associated with the use of opioids has occurred in individuals that had taken the medication for a long time, and were taking their medication correctly. The following are examples of how this can happen: Patient taking his/her medication for a long time, as instructed, without any side effects, is given a certain antibiotic or another unrelated medication, which in turn triggers a "Drug-to-drug interaction" leading to disorientation, cognitive impairment, impaired reflexes, respiratory depression or an untoward event leading to serious bodily harm or injury, including death.  Patient taking his/her medication for a long time, as instructed, without any side effects, develops an acute impairment of liver and/or kidney function. This will lead to a rapid inability of the body to  breakdown and eliminate their pain medication, which will result in effects similar to an "overdose", but with the same medicine and dose that they had always taken. This again may lead to disorientation, cognitive impairment, impaired reflexes, respiratory depression or an untoward event leading to serious bodily harm or injury, including death.  A similar problem will occur with patients as they grow older and their liver and kidney function begins to decrease as part of the aging process.  Background information: Historically, the original case for using long-term opioid therapy to treat chronic noncancer pain was based on safety assumptions that subsequent experience has called into question. In 1996, the American Pain Society and the [place] Academy of [employer] issued a consensus statement supporting long-term opioid therapy. This statement acknowledged the dangers of opioid prescribing but concluded that the risk for addiction was low; respiratory depression induced by opioids was short-lived, occurred mainly in opioid-naive patients, and was antagonized by pain; tolerance was not a common problem; and efforts to control diversion should not constrain opioid prescribing. This has now proven to be wrong. Experience regarding the risks for opioid addiction, misuse, and overdose in community practice has failed to support these assumptions.  According to the Centers for Disease Control and Prevention, fatal overdoses involving opioid analgesics have increased sharply over the past decade. Currently, more than 96,700 people die from drug overdoses every year. Opioids are a factor in 7 out of every 10 overdose deaths. Deaths from drug overdose have surpassed motor vehicle accidents as the leading cause of death for individuals between the ages of 36 and 75.  Clinical data suggest that neuroendocrine dysfunction may be very common in both men and women, potentially causing hypogonadism, erectile  dysfunction, infertility, decreased libido, osteoporosis, and depression. Recent studies linked higher opioid dose to increased opioid-related mortality. Controlled observational studies reported that long-term opioid therapy may be associated with increased risk for cardiovascular events. Subsequent meta-analysis concluded  that the safety of long-term opioid therapy in elderly patients has not been proven.   Side Effects and adverse reactions: Common side effects: Drowsiness (sedation). Dizziness. Nausea and vomiting. Constipation. Physical dependence -- Dependence often manifests with withdrawal symptoms when opioids are discontinued or decreased. Tolerance -- As you take repeated doses of opioids, you require increased medication to experience the same effect of pain relief. Respiratory depression -- This can occur in healthy people, especially with higher doses. However, people with COPD, asthma or other lung conditions may be even more susceptible to fatal respiratory impairment.  Uncommon side effects: An increased sensitivity to feeling pain and extreme response to pain (hyperalgesia). Chronic use of opioids can lead to this. Delayed gastric emptying (the process by which the contents of your stomach are moved into your small intestine). Muscle rigidity. Immune system and hormonal dysfunction. Quick, involuntary muscle jerks (myoclonus). Arrhythmia. Itchy skin (pruritus). Dry mouth (xerostomia).  Long-term side effects: Chronic constipation. Sleep-disordered breathing (SDB). Increased risk of bone fractures. Hypothalamic-pituitary-adrenal dysregulation. Increased risk of overdose.  RISKS: Fractures and Falls:  Opioids increase the risk and incidence of falls. This is of particular importance in elderly patients.  Endocrine System:  Long-term administration is associated with endocrine abnormalities (endocrinopathies). (Also known as Opioid-induced Endocrinopathy) Influences  on both the hypothalamic-pituitary-adrenal axis?and the hypothalamic-pituitary-gonadal axis have been demonstrated with consequent hypogonadism and adrenal insufficiency in both sexes. Hypogonadism and decreased levels of dehydroepiandrosterone sulfate have been reported in men and women. Endocrine effects include: Amenorrhoea in women (abnormal absence of menstruation) Reduced libido in both sexes Decreased sexual function Erectile dysfunction in men Hypogonadisms (decreased testicular function with shrinkage of testicles) Infertility Depression and fatigue Loss of muscle mass Anxiety Depression Immune suppression Hyperalgesia Weight gain Anemia Osteoporosis Patients (particularly women of childbearing age) should avoid opioids. There is insufficient evidence to recommend routine monitoring of asymptomatic patients taking opioids in the long-term for hormonal deficiencies.  Immune System: Human studies have demonstrated that opioids have an immunomodulating effect. These effects are mediated via opioid receptors both on immune effector cells and in the central nervous system. Opioids have been demonstrated to have adverse effects on antimicrobial response and anti-tumour surveillance. Buprenorphine has been demonstrated to have no impact on immune function.  Opioid Induced Hyperalgesia: Human studies have demonstrated that prolonged use of opioids can lead to a state of abnormal pain sensitivity, sometimes called opioid induced hyperalgesia (OIH). Opioid induced hyperalgesia is not usually seen in the absence of tolerance to opioid analgesia. Clinically, hyperalgesia may be diagnosed if the patient on long-term opioid therapy presents with increased pain. This might be qualitatively and anatomically distinct from pain related to disease progression or to breakthrough pain resulting from development of opioid tolerance. Pain associated with hyperalgesia tends to be more diffuse than the  pre-existing pain and less defined in quality. Management of opioid induced hyperalgesia requires opioid dose reduction.  Cancer: Chronic opioid therapy has been associated with an increased risk of cancer among noncancer patients with chronic pain. This association was more evident in chronic strong opioid users. Chronic opioid consumption causes significant pathological changes in the small intestine and colon. Epidemiological studies have found that there is a link between opium dependence and initiation of gastrointestinal cancers. Cancer is the second leading cause of death after cardiovascular disease. Chronic use of opioids can cause multiple conditions such as GERD, immunosuppression and renal damage as well as carcinogenic effects, which are associated with the incidence of cancers.   Mortality: Long-term opioid use  has been associated with increased mortality among patients with chronic non-cancer pain (CNCP).  Prescription of long-acting opioids for chronic noncancer pain was associated with a significantly increased risk of all-cause mortality, including deaths from causes other than overdose.  Reference: Von Korff M, Kolodny A, Deyo RA, Chou R. Long-term opioid therapy reconsidered. Ann Intern Med. 2011 Sep 6;155(5):325-8. doi: 10.7326/[id number]. PMID: [id number]; PMCIDXX:1631110. Morley Kos, Hayward RA, Dunn KM, [place] KP. Risk of adverse events in patients prescribed long-term opioids: A cohort study in the [place] Clinical Practice Research Datalink. Eur J Pain. 2019 May;23(5):908-922. doi: 10.1002/ejp.1357. Epub 2019 Jan 31. PMID: [id number]. Colameco S, Coren JS, Ciervo CA. Continuous opioid treatment for chronic noncancer pain: a time for moderation in prescribing. Postgrad Med. 2009 Jul;121(4):61-6. doi: 10.3810/pgm.2009.07.2032. PMID: [id number]. Heywood Bene RN, [place] SD, Blazina I, Rosalio Loud, Bougatsos C, Deyo RA. The  effectiveness and risks of long-term opioid therapy for chronic pain: a systematic review for a [employer] of Health Pathways to [employer]. Ann Intern Med. 2015 Feb 17;162(4):276-86. doi: [id number]. PMID: [id number]. Marjory Sneddon [facility], [place] DM. NCHS Data Brief No. 22. [place]: Centers for Disease Control and Prevention; 2009. Sep, Increase in Fatal Poisonings Involving Opioid Analgesics in the [place], 1999-2006. Song IA, Choi HR, Oh TK. Long-term opioid use and mortality in patients with chronic non-cancer pain: Ten-year follow-up study in [place] from 2010 through 2019. EClinicalMedicine. 2022 Jul 18;51:101558. doi: 10.1016/j.eclinm.2022.UB:[id number]. PMID: [id number]; PMCIDOX:8550940. Huser, W., Schubert, T., Vogelmann, T. et al. All-cause mortality in patients with long-term opioid therapy compared with non-opioid analgesics for chronic non-cancer pain: a database study. [place] Med 18, 162 (2020). [email or web address] Rashidian H, Roxy Cedar, Malekzadeh R, Haghdoost AA. An Ecological Study of the Association between Opiate Use and Incidence of Cancers. Addict Health. 2016 Fall;8(4):252-260. PMID: [id number]; PMCIDQI:9185013.  Our Goal: Our goal is to control your pain with means other than the use of opioid pain medications.  Our Recommendation: Talk to your physician about coming off of these medications. We can assist you with the tapering down and stopping these medicines. Based on the new information, even if you cannot completely stop the medication, a decrease in the dose may be associated with a lesser risk. Ask for other means of controlling the pain. Decrease or eliminate those factors that significantly contribute to your pain such as smoking, obesity, and a diet heavily tilted towards "inflammatory" nutrients.  Last Updated: 11/10/2022    ____________________________________________________________________________________________     ____________________________________________________________________________________________  Patient Information update  To: All of our patients.  Re: Name change.  It has been made official that our current name, "[facility]"   will soon be changed to "[facility]".   The purpose of this change is to eliminate any confusion created by the concept of our practice being a "Medication Management Pain Clinic". In the past this has led to the misconception that we treat pain primarily by the use of prescription medications.  Nothing can be farther from the truth.   Understanding PAIN MANAGEMENT: To further understand what our practice does, you first have to understand that "Pain Management" is a subspecialty that requires additional training once a physician has completed their specialty training, which can be in either Anesthesia, Neurology, Psychiatry, or Physical Medicine and Rehabilitation (PMR). Each one of these contributes to the final approach taken by each physician to  the management of their patient's pain. To be a "Pain Management Specialist" you must have first completed one of the specialty trainings below.  Anesthesiologists - trained in clinical pharmacology and interventional techniques such as nerve blockade and regional as well as central neuroanatomy. They are trained to block pain before, during, and after surgical interventions.  Neurologists - trained in the diagnosis and pharmacological treatment of complex neurological conditions, such as Multiple Sclerosis, Parkinson's, spinal cord injuries, and other systemic conditions that may be associated with symptoms that may include but are not limited to pain. They tend to rely primarily on the treatment of chronic pain  using prescription medications.  Psychiatrist - trained in conditions affecting the psychosocial wellbeing of patients including but not limited to depression, anxiety, schizophrenia, personality disorders, addiction, and other substance use disorders that may be associated with chronic pain. They tend to rely primarily on the treatment of chronic pain using prescription medications.   Physical Medicine and Rehabilitation (PMR) physicians, also known as physiatrists - trained to treat a wide variety of medical conditions affecting the brain, spinal cord, nerves, bones, joints, ligaments, muscles, and tendons. Their training is primarily aimed at treating patients that have suffered injuries that have caused severe physical impairment. Their training is primarily aimed at the physical therapy and rehabilitation of those patients. They may also work alongside orthopedic surgeons or neurosurgeons using their expertise in assisting surgical patients to recover after their surgeries.  INTERVENTIONAL PAIN MANAGEMENT is sub-subspecialty of Pain Management.  Our physicians are Board-certified in Anesthesia, Pain Management, and Interventional Pain Management.  This meaning that not only have they been trained and Board-certified in their specialty of Anesthesia, and subspecialty of Pain Management, but they have also received further training in the sub-subspecialty of Interventional Pain Management, in order to become Board-certified as INTERVENTIONAL PAIN MANAGEMENT SPECIALIST.    Mission: Our goal is to use our skills in  [place] as alternatives to the chronic use of prescription opioid medications for the treatment of pain. To make this more clear, we have changed our name to reflect what we do and offer. We will continue to offer medication management assessment and recommendations, but we will not be taking over any patient's medication  management.  ____________________________________________________________________________________________     ____________________________________________________________________________________________  National Pain Medication Shortage  The [place] is experiencing worsening drug shortages. These have had a negative widespread effect on patient care and treatment. Not expected to improve any time soon. Predicted to last past 2029.   Drug shortage list (generic names) Oxycodone IR Oxycodone/APAP Oxymorphone IR Hydromorphone Hydrocodone/APAP Morphine  Where is the problem?  Manufacturing and supply level.  Will this shortage affect you?  Only if you take any of the above pain medications.  How? You may be unable to fill your prescription.  Your pharmacist may offer a "partial fill" of your prescription. (Warning: Do not accept partial fills.) Prescriptions partially filled cannot be transferred to another pharmacy. Read our Medication Rules and Regulation. Depending on how much medicine you are dependent on, you may experience withdrawals when unable to get the medication.  Recommendations: Consider ending your dependence on opioid pain medications. Ask your pain specialist to assist you with the process. Consider switching to a medication currently not in shortage, such as Buprenorphine. Talk to your pain specialist about this option. Consider decreasing your pain medication requirements by managing tolerance thru "Drug Holidays". This may help minimize withdrawals, should you run out of medicine. Control your pain thru  the use of non-pharmacological interventional therapies.   Your prescriber: Prescribers cannot be blamed for shortages. Medication manufacturing and supply issues cannot be fixed by the prescriber.   NOTE: The prescriber is not responsible for supplying the medication, or solving supply issues. Work with your pharmacist to solve it. The patient is responsible for  the decision to take or continue taking the medication and for identifying and securing a legal supply source. By law, supplying the medication is the job and responsibility of the pharmacy. The prescriber is responsible for the evaluation, monitoring, and prescribing of these medications.   Prescribers will NOT: Re-issue prescriptions that have been partially filled. Re-issue prescriptions already sent to a pharmacy.  Re-send prescriptions to a different pharmacy because yours did not have your medication. Ask pharmacist to order more medicine or transfer the prescription to another pharmacy. (Read below.)  New 2023 regulation: "May 14, 2022 Revised Regulation Allows DEA-Registered Pharmacies to Transfer Electronic Prescriptions at a Patient's Request [place] Patients now have the ability to request their electronic prescription be transferred to another pharmacy without having to go back to their practitioner to initiate the request. This revised regulation went into effect on Monday, May 10, 2022.     At a patient's request, a DEA-registered retail pharmacy can now transfer an electronic prescription for a controlled substance (schedules II-V) to another DEA-registered retail pharmacy. Prior to this change, patients would have to go through their practitioner to cancel their prescription and have it re-issued to a different pharmacy. The process was taxing and time consuming for both patients and practitioners.    The Drug Enforcement Administration [facility]) published its intent to revise the process for transferring electronic prescriptions on August 01, 2020.  The final rule was published in the federal register on April 08, 2022 and went into effect 30 days later.  Under the final rule, a prescription can only be transferred once between pharmacies, and only if allowed under existing state or other applicable law. The prescription must  remain in its electronic form; may not be altered in any way; and the transfer must be communicated directly between two licensed pharmacists. It's important to note, any authorized refills transfer with the original prescription, which means the entire prescription will be filled at the same pharmacy".  Reference: [email or web address] [facility] website announcement)  [email or web address] ([place])   [employer] / Vol. 88, No. 143 / Thursday, April 08, 2022 / Rules and Regulations DEPARTMENT OF JUSTICE  Drug Enforcement Administration  21 CFR Part 1306  [Docket No. [id number]]  RIN [id number] Transfer of Electronic Prescriptions for Schedules II-V Controlled Substances Between Pharmacies for Initial Filling  ____________________________________________________________________________________________     ____________________________________________________________________________________________  Transfer of Pain Medication between Pharmacies  Re: 2023 DEA Clarification on existing regulation  Published on DEA Website: May 14, 2022  Title: Revised Regulation Allows DEA-Registered Pharmacies to Conservator, museum/gallery Prescriptions at a Patient's Request [place]  "Patients now have the ability to request their electronic prescription be transferred to another pharmacy without having to go back to their practitioner to initiate the request. This revised regulation went into effect on Monday, May 10, 2022.     At a patient's request, a DEA-registered retail pharmacy can now transfer an electronic prescription for a controlled substance (schedules II-V) to another DEA-registered retail pharmacy. Prior to this change, patients would have to go through their practitioner to  cancel their prescription  and have it re-issued to a different pharmacy. The process was taxing and time consuming for both patients and practitioners.    The Drug Enforcement Administration [facility]) published its intent to revise the process for transferring electronic prescriptions on August 01, 2020.  The final rule was published in the federal register on April 08, 2022 and went into effect 30 days later.  Under the final rule, a prescription can only be transferred once between pharmacies, and only if allowed under existing state or other applicable law. The prescription must remain in its electronic form; may not be altered in any way; and the transfer must be communicated directly between two licensed pharmacists. It's important to note, any authorized refills transfer with the original prescription, which means the entire prescription will be filled at the same pharmacy."    REFERENCES: 1. DEA website announcement [email or web address]  2. Department of Justice website  [email or web address].pdf  3. DEPARTMENT OF JUSTICE Drug Enforcement Administration 21 CFR Part 1306 [Docket No. [id number]] RIN [id number] "Transfer of Electronic Prescriptions for Schedules II-V Controlled Substances Between Pharmacies for Initial Filling"  ____________________________________________________________________________________________     _______________________________________________________________________  Medication Rules  Purpose: To inform patients, and their family members, of our medication rules and regulations.  Applies to: All patients receiving prescriptions from our practice (written or electronic).  Pharmacy of record: This is the pharmacy where your electronic prescriptions will be sent. Make sure we have the correct one.  Electronic prescriptions: In  compliance with the [place] (STOP) Act of 2017 (Session Lanny Cramp [phone]), effective September 13, 2018, all controlled substances must be electronically prescribed. Written prescriptions, faxing, or calling prescriptions to a pharmacy will no longer be done.  Prescription refills: These will be provided only during in-person appointments. No medications will be renewed without a "face-to-face" evaluation with your provider. Applies to all prescriptions.  NOTE: The following applies primarily to controlled substances (Opioid* Pain Medications).   Type of encounter (visit): For patients receiving controlled substances, face-to-face visits are required. (Not an option and not up to the patient.)  Patient's responsibilities: Pain Pills: Bring all pain pills to every appointment (except for procedure appointments). Pill Bottles: Bring pills in original pharmacy bottle. Bring bottle, even if empty. Always bring the bottle of the most recent fill.  Medication refills: You are responsible for knowing and keeping track of what medications you are taking and when is it that you will need a refill. The day before your appointment: write a list of all prescriptions that need to be refilled. The day of the appointment: give the list to the admitting nurse. Prescriptions will be written only during appointments. No prescriptions will be written on procedure days. If you forget a medication: it will not be "Called in", "Faxed", or "electronically sent". You will need to get another appointment to get these prescribed. No early refills. Do not call asking to have your prescription filled early. Partial  or short prescriptions: Occasionally your pharmacy may not have enough pills to fill your prescription.  NEVER ACCEPT a partial fill or a prescription that is short of the total amount of pills that you were prescribed.  With controlled substances the law allows 72 hours for  the pharmacy to complete the prescription.  If the prescription is not completed within 72 hours, the pharmacist will require a new prescription to be written. This means that you will be short on your medicine and we WILL NOT send another prescription to complete your original  prescription.  Instead, request the pharmacy to send a carrier to a nearby branch to get enough medication to provide you with your full prescription. Prescription Accuracy: You are responsible for carefully inspecting your prescriptions before leaving our office. Have the discharge nurse carefully go over each prescription with you, before taking them home. Make sure that your name is accurately spelled, that your address is correct. Check the name and dose of your medication to make sure it is accurate. Check the number of pills, and the written instructions to make sure they are clear and accurate. Make sure that you are given enough medication to last until your next medication refill appointment. Taking Medication: Take medication as prescribed. When it comes to controlled substances, taking less pills or less frequently than prescribed is permitted and encouraged. Never take more pills than instructed. Never take the medication more frequently than prescribed.  Inform other Doctors: Always inform, all of your healthcare providers, of all the medications you take. Pain Medication from other Providers: You are not allowed to accept any additional pain medication from any other Doctor or Healthcare provider. There are two exceptions to this rule. (see below) In the event that you require additional pain medication, you are responsible for notifying us, as stated below. Cough Medicine: Often these contain an opioid, such as codeine or hydrocodone. Never accept or take cough medicine containing these opioids if you are already taking an opioid* medication. The combination may cause respiratory failure and death. Medication Agreement:  You are responsible for carefully reading and following our Medication Agreement. This must be signed before receiving any prescriptions from our practice. Safely store a copy of your signed Agreement. Violations to the Agreement will result in no further prescriptions. (Additional copies of our Medication Agreement are available upon request.) Laws, Rules, & Regulations: All patients are expected to follow all Federal and [employer], [employer], Rules, [facility]. Ignorance of the Laws does not constitute a valid excuse.  Illegal drugs and Controlled Substances: The use of illegal substances (including, but not limited to marijuana and its derivatives) and/or the illegal use of any controlled substances is strictly prohibited. Violation of this rule may result in the immediate and permanent discontinuation of any and all prescriptions being written by our practice. The use of any illegal substances is prohibited. Adopted CDC guidelines & recommendations: Target dosing levels will be at or below 60 MME/day. Use of benzodiazepines** is not recommended.  Exceptions: There are only two exceptions to the rule of not receiving pain medications from other Healthcare Providers. Exception #1 (Emergencies): In the event of an emergency (i.e.: accident requiring emergency care), you are allowed to receive additional pain medication. However, you are responsible for: As soon as you are able, call our office [phone], at any time of the day or night, and leave a message stating your name, the date and nature of the emergency, and the name and dose of the medication prescribed. In the event that your call is answered by a member of our staff, make sure to document and save the date, time, and the name of the person that took your information.  Exception #2 (Planned Surgery): In the event that you are scheduled by another doctor or dentist to have any type of surgery or procedure, you are allowed (for a period no  longer than 30 days), to receive additional pain medication, for the acute post-op pain. However, in this case, you are responsible for picking up a copy of  our "Post-op Pain Management for Surgeons" handout, and giving it to your surgeon or dentist. This document is available at our office, and does not require an appointment to obtain it. Simply go to our office during business hours (Monday-Thursday from 8:00 AM to 4:00 PM) (Friday 8:00 AM to 12:00 Noon) or if you have a scheduled appointment with [place], prior to your surgery, and ask for it by name. In addition, you are responsible for: calling our office [phone], at any time of the day or night, and leaving a message stating your name, name of your surgeon, type of surgery, and date of procedure or surgery. Failure to comply with your responsibilities may result in termination of therapy involving the controlled substances. Medication Agreement Violation. Following the above rules, including your responsibilities will help you in avoiding a Medication Agreement Violation ("Breaking your Pain Medication Contract").  Consequences:  Not following the above rules may result in permanent discontinuation of medication prescription therapy.  *Opioid medications include: morphine, codeine, oxycodone, oxymorphone, hydrocodone, hydromorphone, meperidine, tramadol, tapentadol, buprenorphine, fentanyl, methadone. **Benzodiazepine medications include: diazepam (Valium), alprazolam (Xanax), clonazepam (Klonopine), lorazepam (Ativan), clorazepate (Tranxene), chlordiazepoxide (Librium), estazolam (Prosom), oxazepam (Serax), temazepam (Restoril), triazolam (Halcion) (Last updated: 07/06/2022) ______________________________________________________________________    ______________________________________________________________________  Medication Recommendations and Reminders  Applies to: All patients receiving prescriptions (written and/or  electronic).  Medication Rules & Regulations: You are responsible for reading, knowing, and following our "Medication Rules" document. These exist for your safety and that of others. They are not flexible and neither are we. Dismissing or ignoring them is an act of "non-compliance" that may result in complete and irreversible termination of such medication therapy. For safety reasons, "non-compliance" will not be tolerated. As with the [place] fundamental legal principle of "ignorance of the law is no defense", we will accept no excuses for not having read and knowing the content of documents provided to you by our practice.  Pharmacy of record:  Definition: This is the pharmacy where your electronic prescriptions will be sent.  We do not endorse any particular pharmacy. It is up to you and your insurance to decide what pharmacy to use.  We do not restrict you in your choice of pharmacy. However, once we write for your prescriptions, we will NOT be re-sending more prescriptions to fix restricted supply problems created by your pharmacy, or your insurance.  The pharmacy listed in the electronic medical record should be the one where you want electronic prescriptions to be sent. If you choose to change pharmacy, simply notify our nursing staff. Changes will be made only during your regular appointments and not over the phone.  Recommendations: Keep all of your pain medications in a safe place, under lock and key, even if you live alone. We will NOT replace lost, stolen, or damaged medication. We do not accept "Police Reports" as proof of medications having been stolen. After you fill your prescription, take 1 week's worth of pills and put them away in a safe place. You should keep a separate, properly labeled bottle for this purpose. The remainder should be kept in the original bottle. Use this as your primary supply, until it runs out. Once it's gone, then you know that you have 1 week's worth of medicine,  and it is time to come in for a prescription refill. If you do this correctly, it is unlikely that you will ever run out of medicine. To make sure that the above recommendation works, it is very important that you make  sure your medication refill appointments are scheduled at least 1 week before you run out of medicine. To do this in an effective manner, make sure that you do not leave the office without scheduling your next medication management appointment. Always ask the nursing staff to show you in your prescription , when your medication will be running out. Then arrange for the receptionist to get you a return appointment, at least 7 days before you run out of medicine. Do not wait until you have 1 or 2 pills left, to come in. This is very poor planning and does not take into consideration that we may need to cancel appointments due to bad weather, sickness, or emergencies affecting our staff. DO NOT ACCEPT A "Partial Fill": If for any reason your pharmacy does not have enough pills/tablets to completely fill or refill your prescription, do not allow for a "partial fill". The law allows the pharmacy to complete that prescription within 72 hours, without requiring a new prescription. If they do not fill the rest of your prescription within those 72 hours, you will need a separate prescription to fill the remaining amount, which we will NOT provide. If the reason for the partial fill is your insurance, you will need to talk to the pharmacist about payment alternatives for the remaining tablets, but again, DO NOT ACCEPT A PARTIAL FILL, unless you can trust your pharmacist to obtain the remainder of the pills within 72 hours.  Prescription refills and/or changes in medication(s):  Prescription refills, and/or changes in dose or medication, will be conducted only during scheduled medication management appointments. (Applies to both, written and electronic prescriptions.) No refills on procedure days. No  medication will be changed or started on procedure days. No changes, adjustments, and/or refills will be conducted on a procedure day. Doing so will interfere with the diagnostic portion of the procedure. No phone refills. No medications will be "called into the pharmacy". No Fax refills. No weekend refills. No Holliday refills. No after hours refills.  Remember:  Business hours are:  Monday to Thursday 8:00 AM to 4:00 PM Provider's Schedule: Milinda Pointer, MD - Appointments are:  Medication management: Monday and Wednesday 8:00 AM to 4:00 PM Procedure day: Tuesday and Thursday 7:30 AM to 4:00 PM Gillis Santa, MD - Appointments are:  Medication management: Tuesday and Thursday 8:00 AM to 4:00 PM Procedure day: Monday and Wednesday 7:30 AM to 4:00 PM (Last update: 07/06/2022) ______________________________________________________________________    ____________________________________________________________________________________________  Drug Holidays  What is a "Drug Holiday"? Drug Holiday: is the name given to the process of slowly tapering down and temporarily stopping the pain medication for the purpose of decreasing or eliminating tolerance to the drug.  Benefits Improved effectiveness Decreased required effective dose Improved pain control End dependence on high dose therapy Decrease cost of therapy Uncovering "opioid-induced hyperalgesia". (OIH)  What is "opioid hyperalgesia"? It is a paradoxical increase in pain caused by exposure to opioids. Stopping the opioid pain medication, contrary to the expected, it actually decreases or completely eliminates the pain. Ref.: "A comprehensive review of opioid-induced hyperalgesia". Brion Aliment, et.al. Pain Physician. 2011 Mar-Apr;14(2):145-61.  What is tolerance? Tolerance: the progressive loss of effectiveness of a pain medicine due to repetitive use. A common problem of opioid pain medications.  How long should a "Drug  Holiday" last? Effectiveness depends on the patient staying off all opioid pain medicines for a minimum of 14 consecutive days. (2 weeks)  How about just taking less of the medicine? Does not  work. Will not accomplish goal of eliminating the excess receptors.  How about switching to a different pain medicine? (AKA. "Opioid rotation") Does not work. Creates the illusion of effectiveness by taking advantage of inaccurate equivalent dose calculations between different opioids. -This "technique" was promoted by studies funded by [employer], such as [employer], creators of "OxyContin".  Can I stop the medicine "cold [place]"? Depends. You should always coordinate with your Pain Specialist to make the transition as smoothly as possible. Avoid stopping the medicine abruptly without consulting. We recommend a "slow taper".  What is a slow taper? Taper: refers to the gradual decrease in dose.   How do I stop/taper the dose? Slowly. Decrease the daily amount of pills that you take by one (1) pill every seven (7) days. This is called a "slow downward taper". Example: if you normally take four (4) pills per day, drop it to three (3) pills per day for seven (7) days, then to two (2) pills per day for seven (7) days, then to one (1) per day for seven (7) days, and then stop the medicine. The 14 day "Drug Holiday" starts on the first day without medicine.   Will I experience withdrawals? Unlikely with a slow taper.  What triggers withdrawals? Withdrawals are triggered by the sudden/abrupt stop of high dose opioids. Withdrawals can be avoided by slowly decreasing the dose over a prolonged period of time.  What are withdrawals? Symptoms associated with sudden/abrupt reduction/stopping of high-dose, long-term use of pain medication. Withdrawal are seldom seen on low dose therapy, or patients rarely taking opioid medication.  Early Withdrawal Symptoms may include: Agitation Anxiety Muscle  aches Increased tearing Insomnia Runny nose Sweating Yawning  Late symptoms may include: Abdominal cramping Diarrhea Dilated pupils Goose bumps Nausea Vomiting  (Last update: 08/22/2022) ____________________________________________________________________________________________    ____________________________________________________________________________________________  WARNING: CBD (cannabidiol) & Delta (Delta-8 tetrahydrocannabinol) products.   Applicable to:  All individuals currently taking or considering taking CBD (cannabidiol) and, more important, all patients taking opioid analgesic controlled substances (pain medication). (Example: oxycodone; oxymorphone; hydrocodone; hydromorphone; morphine; methadone; tramadol; tapentadol; fentanyl; buprenorphine; butorphanol; dextromethorphan; meperidine; codeine; etc.)  Introduction:  Recently there has been a drive towards the use of "natural" products for the treatment of different conditions, including pain anxiety and sleep disorders. Marijuana and hemp are two varieties of the cannabis genus plants. Marijuana and its derivatives are illegal, while hemp and its derivatives are not. Cannabidiol (CBD) and tetrahydrocannabinol (THC), are two natural compounds found in plants of the Cannabis genus. They can both be extracted from hemp or marijuana. Both compounds interact with your body's endocannabinoid system in very different ways. CBD is associated with pain relief (analgesia) while THC is associated with the psychoactive effects ("the high") obtained from the use of marijuana products. There are two main types of THC: Delta-9, which comes from the marijuana plant and it is illegal, and Delta-8, which comes from the hemp plant, and it is legal. (Both, Delta-9-THC and Delta-8-THC are psychoactive and give you "the high".)   Legality:  Marijuana and its derivatives: illegal Hemp and its derivatives: Legal (State dependent) UPDATE:  (10/30/2021) The Drug Enforcement Agency ([place]) issued a letter stating that "delta" cannabinoids, including Delta-8-THCO and Delta-9-THCO, synthetically derived from hemp do not qualify as hemp and will be viewed as Schedule I drugs. (Schedule I drugs, substances, or chemicals are defined as drugs with no currently accepted medical use and a high potential for abuse. Some examples of Schedule I drugs are: heroin,  lysergic acid diethylamide (LSD), marijuana (cannabis), 3,4-methylenedioxymethamphetamine (ecstasy), methaqualone, and peyote.) (https://jennings.com/)  Legal status of CBD in [place]:  "Conditionally Legal"  Reference: "FDA Regulation of Cannabis and Cannabis-Derived Products, Including Cannabidiol (CBD)" - SeekArtists.com.pt  Warning:  CBD is not FDA approved and has not undergo the same manufacturing controls as prescription drugs.  This means that the purity and safety of available CBD may be questionable. Most of the time, despite manufacturer's claims, it is contaminated with THC (delta-9-tetrahydrocannabinol - the chemical in marijuana responsible for the "HIGH").  When this is the case, the [facility] contaminant will trigger a positive urine drug screen (UDS) test for Marijuana (carboxy-THC).   The FDA recently put out a warning about 5 things that everyone should be aware of regarding Delta-8 THC: Delta-8 THC products have not been evaluated or approved by the FDA for safe use and may be marketed in ways that put the public health at risk. The FDA has received adverse event reports involving delta-8 THC-containing products. Delta-8 THC has psychoactive and intoxicating effects. Delta-8 THC manufacturing often involve use of potentially harmful chemicals to create the concentrations of delta-8 THC claimed in the marketplace. The final delta-8 THC product may have potentially harmful  by-products (contaminants) due to the chemicals used in the process. Manufacturing of delta-8 THC products may occur in uncontrolled or unsanitary settings, which may lead to the presence of unsafe contaminants or other potentially harmful substances. Delta-8 THC products should be kept out of the reach of children and pets.  NOTE: Because a positive UDS for any illicit substance is a violation of our medication agreement, your opioid analgesics (pain medicine) may be permanently discontinued.  MORE ABOUT CBD  General Information: CBD was discovered in [birth year] and it is a derivative of the cannabis sativa genus plants (Marijuana and Hemp). It is one of the 113 identified substances found in Marijuana. It accounts for up to 40% of the plant's extract. As of 2018, preliminary clinical studies on CBD included research for the treatment of anxiety, movement disorders, and pain. CBD is available and consumed in multiple forms, including inhalation of smoke or vapor, as an aerosol spray, and by mouth. It may be supplied as an oil containing CBD, capsules, dried cannabis, or as a liquid solution. CBD is thought not to be as psychoactive as THC (delta-9-tetrahydrocannabinol - the chemical in marijuana responsible for the "HIGH"). Studies suggest that CBD may interact with different biological target receptors in the body, including cannabinoid and other neurotransmitter receptors. As of 2018 the mechanism of action for its biological effects has not been determined.  Side-effects  Adverse reactions: Dry mouth, diarrhea, decreased appetite, fatigue, drowsiness, malaise, weakness, sleep disturbances, and others.  Drug interactions:  CBD may interact with medications such as blood-thinners. CBD causes drowsiness on its own and it will increase drowsiness caused by other medications, including antihistamines (such as Benadryl), benzodiazepines (Xanax, Ativan, Valium), antipsychotics, antidepressants, opioids, alcohol  and supplements such as kava, melatonin and St. John's Wort.  Other drug interactions: Brivaracetam (Briviact); Caffeine; Carbamazepine (Tegretol); Citalopram (Celexa); Clobazam (Onfi); Eslicarbazepine (Aptiom); Everolimus (Zostress); Lithium; Methadone (Dolophine); Rufinamide (Banzel); Sedative medications (CNS depressants); Sirolimus (Rapamune); Stiripentol (Diacomit); Tacrolimus (Prograf); Tamoxifen ; Soltamox); Topiramate (Topamax); Valproate; Warfarin (Coumadin); Zonisamide. (Last update: 08/23/2022) ____________________________________________________________________________________________   ____________________________________________________________________________________________  Naloxone Nasal Spray  Why am I receiving this medication? [place] STOP ACT requires that all patients taking high dose opioids or at risk of opioids respiratory depression, be prescribed an opioid reversal agent, such as  Naloxone (AKA: Narcan).  What is this medication? NALOXONE (nal OX one) treats opioid overdose, which causes slow or shallow breathing, severe drowsiness, or trouble staying awake. Call emergency services after using this medication. You may need additional treatment. Naloxone works by reversing the effects of opioids. It belongs to a group of medications called opioid blockers.  COMMON BRAND NAME(S): Kloxxado, Narcan  What should I tell my care team before I take this medication? They need to know if you have any of these conditions: Heart disease Substance use disorder An unusual or allergic reaction to naloxone, other medications, foods, dyes, or preservatives Pregnant or trying to get pregnant Breast-feeding  When to use this medication? This medication is to be used for the treatment of respiratory depression (less than 8 breaths per minute) secondary to opioid overdose.   How to use this medication? This medication is for use in the nose. Lay the person on their back.  Support their neck with your hand and allow the head to tilt back before giving the medication. The nasal spray should be given into 1 nostril. After giving the medication, move the person onto their side. Do not remove or test the nasal spray until ready to use. Get emergency medical help right away after giving the first dose of this medication, even if the person wakes up. You should be familiar with how to recognize the signs and symptoms of a narcotic overdose. If more doses are needed, give the additional dose in the other nostril. Talk to your care team about the use of this medication in children. While this medication may be prescribed for children as young as newborns for selected conditions, precautions do apply.  Naloxone Overdosage: If you think you have taken too much of this medicine contact a poison control center or emergency room at once.  NOTE: This medicine is only for you. Do not share this medicine with others.  What if I miss a dose? This does not apply.  What may interact with this medication? This is only used during an emergency. No interactions are expected during emergency use. This list may not describe all possible interactions. Give your health care provider a list of all the medicines, herbs, non-prescription drugs, or dietary supplements you use. Also tell them if you smoke, drink alcohol, or use illegal drugs. Some items may interact with your medicine.  What should I watch for while using this medication? Keep this medication ready for use in the case of an opioid overdose. Make sure that you have the phone number of your care team and local hospital ready. You may need to have additional doses of this medication. Each nasal spray contains a single dose. Some emergencies may require additional doses. After use, bring the treated person to the nearest hospital or call 911. Make sure the treating care team knows that the person has received a dose of this medication.  You will receive additional instructions on what to do during and after use of this medication before an emergency occurs.  What side effects may I notice from receiving this medication? Side effects that you should report to your care team as soon as possible: Allergic reactions--skin rash, itching, hives, swelling of the face, lips, tongue, or throat Side effects that usually do not require medical attention (report these to your care team if they continue or are bothersome): Constipation Dryness or irritation inside the nose Headache Increase in blood pressure Muscle spasms Stuffy nose Toothache This list may not  describe all possible side effects. Call your doctor for medical advice about side effects. You may report side effects to FDA at 1-800-FDA-1088.  Where should I keep my medication? Because this is an emergency medication, you should keep it with you at all times.  Keep out of the reach of children and pets. Store between 20 and 25 degrees C (68 and 77 degrees F). Do not freeze. Throw away any unused medication after the expiration date. Keep in original box until ready to use.  NOTE: This sheet is a summary. It may not cover all possible information. If you have questions about this medicine, talk to your doctor, pharmacist, or health care provider.   2023 Elsevier/Gold Standard (2021-05-08 00:00:00)  ____________________________________________________________________________________________

## 2022-11-24 NOTE — Assessment & Plan Note (Signed)
Recommend patient to continue vitamin B12 supplementation 1000 mcg daily.

## 2022-11-24 NOTE — Patient Instructions (Signed)

## 2022-11-24 NOTE — Assessment & Plan Note (Signed)
#  Iron deficiency anemia  Labs reviewed and discussed with patient Patient has stable hemoglobin and iron panel. Proceed with maintenance Venofer 200 mg x 1 today.

## 2022-11-24 NOTE — Progress Notes (Signed)
Nursing Pain Medication Assessment:  Safety precautions to be maintained throughout the outpatient stay will include: orient to surroundings, keep bed in low position, maintain call bell within reach at all times, provide assistance with transfer out of bed and ambulation.  Medication Inspection Compliance: Pill count conducted under aseptic conditions, in front of the patient. Neither the pills nor the bottle was removed from the patient's sight at any time. Once count was completed pills were immediately returned to the patient in their original bottle.  Medication: Oxycodone IR Pill/Patch Count:  64 of 90 pills remain Pill/Patch Appearance: Markings consistent with prescribed medication Bottle Appearance: Standard pharmacy container. Clearly labeled. Filled Date: 2 / 17 / 2024 Last Medication intake:  TodaySafety precautions to be maintained throughout the outpatient stay will include: orient to surroundings, keep bed in low position, maintain call bell within reach at all times, provide assistance with transfer out of bed and ambulation.

## 2022-11-28 LAB — TOXASSURE SELECT 13 (MW), URINE

## 2022-12-07 DIAGNOSIS — F331 Major depressive disorder, recurrent, moderate: Secondary | ICD-10-CM | POA: Diagnosis not present

## 2022-12-08 DIAGNOSIS — E1159 Type 2 diabetes mellitus with other circulatory complications: Secondary | ICD-10-CM | POA: Diagnosis not present

## 2022-12-08 DIAGNOSIS — I10 Essential (primary) hypertension: Secondary | ICD-10-CM | POA: Diagnosis not present

## 2022-12-08 DIAGNOSIS — E1169 Type 2 diabetes mellitus with other specified complication: Secondary | ICD-10-CM | POA: Diagnosis not present

## 2022-12-08 DIAGNOSIS — K0889 Other specified disorders of teeth and supporting structures: Secondary | ICD-10-CM | POA: Diagnosis not present

## 2022-12-08 DIAGNOSIS — J342 Deviated nasal septum: Secondary | ICD-10-CM | POA: Diagnosis not present

## 2022-12-08 DIAGNOSIS — E079 Disorder of thyroid, unspecified: Secondary | ICD-10-CM | POA: Diagnosis not present

## 2022-12-08 DIAGNOSIS — I152 Hypertension secondary to endocrine disorders: Secondary | ICD-10-CM | POA: Diagnosis not present

## 2022-12-08 DIAGNOSIS — E785 Hyperlipidemia, unspecified: Secondary | ICD-10-CM | POA: Diagnosis not present

## 2022-12-08 DIAGNOSIS — E1140 Type 2 diabetes mellitus with diabetic neuropathy, unspecified: Secondary | ICD-10-CM | POA: Diagnosis not present

## 2022-12-08 DIAGNOSIS — E890 Postprocedural hypothyroidism: Secondary | ICD-10-CM | POA: Diagnosis not present

## 2022-12-08 DIAGNOSIS — Z79899 Other long term (current) drug therapy: Secondary | ICD-10-CM | POA: Diagnosis not present

## 2022-12-08 DIAGNOSIS — Z0000 Encounter for general adult medical examination without abnormal findings: Secondary | ICD-10-CM | POA: Diagnosis not present

## 2022-12-08 DIAGNOSIS — R600 Localized edema: Secondary | ICD-10-CM | POA: Diagnosis not present

## 2022-12-10 DIAGNOSIS — M170 Bilateral primary osteoarthritis of knee: Secondary | ICD-10-CM | POA: Diagnosis not present

## 2022-12-23 DIAGNOSIS — M170 Bilateral primary osteoarthritis of knee: Secondary | ICD-10-CM | POA: Diagnosis not present

## 2022-12-23 DIAGNOSIS — M25462 Effusion, left knee: Secondary | ICD-10-CM | POA: Diagnosis not present

## 2022-12-23 DIAGNOSIS — N390 Urinary tract infection, site not specified: Secondary | ICD-10-CM | POA: Diagnosis not present

## 2022-12-23 DIAGNOSIS — M25461 Effusion, right knee: Secondary | ICD-10-CM | POA: Diagnosis not present

## 2022-12-23 DIAGNOSIS — R300 Dysuria: Secondary | ICD-10-CM | POA: Diagnosis not present

## 2022-12-23 DIAGNOSIS — S0180XA Unspecified open wound of other part of head, initial encounter: Secondary | ICD-10-CM | POA: Diagnosis not present

## 2022-12-23 DIAGNOSIS — B3731 Acute candidiasis of vulva and vagina: Secondary | ICD-10-CM | POA: Diagnosis not present

## 2022-12-23 DIAGNOSIS — B009 Herpesviral infection, unspecified: Secondary | ICD-10-CM | POA: Diagnosis not present

## 2022-12-24 ENCOUNTER — Other Ambulatory Visit: Payer: Self-pay | Admitting: Internal Medicine

## 2022-12-24 DIAGNOSIS — Z1231 Encounter for screening mammogram for malignant neoplasm of breast: Secondary | ICD-10-CM

## 2022-12-31 DIAGNOSIS — M170 Bilateral primary osteoarthritis of knee: Secondary | ICD-10-CM | POA: Diagnosis not present

## 2023-01-04 DIAGNOSIS — M170 Bilateral primary osteoarthritis of knee: Secondary | ICD-10-CM | POA: Diagnosis not present

## 2023-01-06 DIAGNOSIS — F331 Major depressive disorder, recurrent, moderate: Secondary | ICD-10-CM | POA: Diagnosis not present

## 2023-01-10 ENCOUNTER — Ambulatory Visit: Payer: Medicare HMO | Admitting: Urology

## 2023-01-10 DIAGNOSIS — R0602 Shortness of breath: Secondary | ICD-10-CM | POA: Diagnosis not present

## 2023-01-10 DIAGNOSIS — J301 Allergic rhinitis due to pollen: Secondary | ICD-10-CM | POA: Diagnosis not present

## 2023-01-10 DIAGNOSIS — K219 Gastro-esophageal reflux disease without esophagitis: Secondary | ICD-10-CM | POA: Diagnosis not present

## 2023-01-10 DIAGNOSIS — J4551 Severe persistent asthma with (acute) exacerbation: Secondary | ICD-10-CM | POA: Diagnosis not present

## 2023-01-12 DIAGNOSIS — F331 Major depressive disorder, recurrent, moderate: Secondary | ICD-10-CM | POA: Diagnosis not present

## 2023-01-20 DIAGNOSIS — M170 Bilateral primary osteoarthritis of knee: Secondary | ICD-10-CM | POA: Diagnosis not present

## 2023-02-04 DIAGNOSIS — L03211 Cellulitis of face: Secondary | ICD-10-CM | POA: Diagnosis not present

## 2023-02-11 DIAGNOSIS — F331 Major depressive disorder, recurrent, moderate: Secondary | ICD-10-CM | POA: Diagnosis not present

## 2023-02-15 DIAGNOSIS — Z9884 Bariatric surgery status: Secondary | ICD-10-CM | POA: Diagnosis not present

## 2023-02-16 DIAGNOSIS — L700 Acne vulgaris: Secondary | ICD-10-CM | POA: Diagnosis not present

## 2023-02-16 DIAGNOSIS — B3731 Acute candidiasis of vulva and vagina: Secondary | ICD-10-CM | POA: Diagnosis not present

## 2023-02-16 DIAGNOSIS — L03211 Cellulitis of face: Secondary | ICD-10-CM | POA: Diagnosis not present

## 2023-02-16 DIAGNOSIS — E118 Type 2 diabetes mellitus with unspecified complications: Secondary | ICD-10-CM | POA: Diagnosis not present

## 2023-02-22 NOTE — Progress Notes (Unsigned)
PROVIDER NOTE: Information contained herein reflects review and annotations entered in association with encounter. Interpretation of such information and data should be left to medically-trained personnel. Information provided to patient can be located elsewhere in the medical record under "Patient Instructions". Document created using STT-dictation technology, any transcriptional errors that may result from process are unintentional.    Patient: Karen Lewis  Service Category: E/M  Provider: Oswaldo Done, MD  DOB: 05-13-1971  DOS: 02/23/2023  Referring Provider: Marguarite Arbour, MD  MRN: [id number]  Specialty: Interventional Pain Management  PCP: Marguarite Arbour, MD  Type: Established Patient  Setting: Ambulatory outpatient    Location: Office  Delivery: Face-to-face     HPI  [name], a 52 y.o. year old female, is here today because of her No primary diagnosis found.. [name] primary complain today is No chief complaint on file.  Pertinent problems: Ms. Bonifas has Fibromyalgia; Chronic knee pain (1ry area of Pain) (Bilateral) (R>L); Chronic low back pain (3ry area of Pain) (Bilateral) (R>L) w/o sciatica; Lumbar facet syndrome (Bilateral) (R>L); Secondary osteoarthritis of multiple sites; Grade 1  Anterolisthesis of L3/L4 & L4/L5 (1.4 cm); Chronic hip pain (2ry area of Pain) (Right); S/P THR (total hip replacement) (Left); Lumbar spondylosis; Chronic pain syndrome; Neurogenic pain; Upper extremity pain; Chronic shoulder pain (Left); Osteoarthritis of shoulder (Left); Osteoarthritis of hip (Right); Secondary Osteoarthritis of knee (Bilateral) (R>L); Spondylosis without myelopathy or radiculopathy, lumbosacral region; Panniculitis; Leg swelling; Edema of both legs; History of total hip replacement (Bilateral); S/P THR (total hip replacement) (Right) (11/27/2019); Pain and numbness of left upper extremity; Cervical radiculitis (Left); Bilateral primary osteoarthritis of knee; DDD  (degenerative disc disease), lumbosacral; Osteoarthritis of lumbar spine without myelopathy or radiculopathy; Lumbosacral radiculopathy at S1 (Left); Tricompartment osteoarthritis of knees (Bilateral); Lumbosacral radiculopathy at L2 (Right); Abnormal MRI, lumbar spine (11/10/2021); Chronic thigh pain (Right); Weakness of leg (Right); Lumbar interspinous bursitis (L2-L5); Spinal enthesopathy of lumbar region [facility]); Lumbar facet arthropathy (Multilevel) (Bilateral); Lumbar central spinal stenosis, w/o claudication (L2-3, L4-5, L5-S1); Lumbar foraminal stenosis (Bilateral: L2-3, L4-5) (L>R); Lumbosacral lateral recess stenosis (Bilateral: L4-5, L5-S1); and Trigger point of right shoulder region on their pertinent problem list. Pain Assessment: Severity of   is reported as a  /10. Location:    / . Onset:  . Quality:  . Timing:  . Modifying factor(s):  Marland Kitchen Vitals:  vitals were not taken for this visit.  BMI: Estimated body mass index is 60.94 kg/m as calculated from the following:   Height as of 11/24/22: 5\' 3"  (1.6 m).   Weight as of 11/24/22: 344 lb (156 kg). Last encounter: 11/24/2022. Last procedure: 05/25/2022.  Reason for encounter:  *** . ***  Pharmacotherapy Assessment  Analgesic: Oxycodone IR 5 mg 1 tab PO TID (#90/mo) (15 mg/day) MME/day: 22.5 mg/day.   Monitoring: [place] PMP: PDMP reviewed during this encounter.       Pharmacotherapy: No side-effects or adverse reactions reported. Compliance: No problems identified. Effectiveness: Clinically acceptable.  No notes on file  No results found for: "CBDTHCR" No results found for: "D8THCCBX" No results found for: "D9THCCBX"  UDS:  Summary  Date Value Ref Range Status  11/24/2022 Note  Final    Comment:    ==================================================================== [employer] [facility] (MW) ==================================================================== Test                             Result       Flag  Units  Drug  Present and Declared for Prescription Verification   7-aminoclonazepam              49           EXPECTED   ng/mg creat    7-aminoclonazepam is an expected metabolite of clonazepam. Source of    clonazepam is a scheduled prescription medication.    Noroxycodone                   230          EXPECTED   ng/mg creat    Noroxycodone is an expected metabolite of oxycodone. Sources of    oxycodone include scheduled prescription medications.  Drug Absent but Declared for Prescription Verification   Oxycodone                      Not Detected UNEXPECTED ng/mg creat    Oxycodone is almost always present in patients taking this drug    consistently.  Absence of oxycodone could be due to lapse of time    since the last dose or unusual pharmacokinetics (rapid metabolism).    Butalbital                     Not Detected UNEXPECTED ==================================================================== Test                      Result    Flag   Units      Ref Range   Creatinine              237              mg/dL      >=16 ==================================================================== Declared Medications:  The flagging and interpretation on this report are based on the  following declared medications.  Unexpected results may arise from  inaccuracies in the declared medications.   **Note: The testing scope of this panel includes these medications:   Butalbital (Fioricet)  Clonazepam (Klonopin)  Oxycodone   **Note: The testing scope of this panel does not include the  following reported medications:   Acetaminophen  Acetaminophen (Fioricet)  Albuterol  Amitriptyline (Elavil)  Biotin  Caffeine (Fioricet)  Calcium (Tums)  Diclofenac (Voltaren)  Diphenhydramine (Benadryl)  Duloxetine (Cymbalta)  Epinephrine (EpiPen)  Famotidine (Pepcid)  Fluticasone (Breo)  Galcanezumab (Emgality)  Indeterminate Medication  Levocetirizine (Xyzal)  Levothyroxine (Synthroid)  Magnesium (Mag-Ox)   Meclizine (Antivert)  Metformin (Glucophage)  Montelukast (Singulair)  Multivitamin  Naloxone (Narcan)  Nitrofurantoin (Macrobid)  Omeprazole (Prilosec)  Probiotic  Rizatriptan (Maxalt)  Rosuvastatin (Crestor)  Semaglutide (Ozempic)  Tizanidine (Zanaflex)  Topiramate (Topamax)  Vilanterol (Breo)  Vitamin B12  Vitamin C  Vitamin D2 ==================================================================== For clinical consultation, please call [phone]. ====================================================================       ROS  Constitutional: Denies any fever or chills Gastrointestinal: No reported hemesis, hematochezia, vomiting, or acute GI distress Musculoskeletal: Denies any acute onset joint swelling, redness, loss of ROM, or weakness Neurological: No reported episodes of acute onset apraxia, aphasia, dysarthria, agnosia, amnesia, paralysis, loss of coordination, or loss of consciousness  Medication Review  B-12, Biotin, DULoxetine, EPINEPHrine, Galcanezumab-gnlm, Semaglutide(0.25 or 0.5MG /DOS), Tab-A-Vite, acetaminophen, acidophilus, albuterol, amitriptyline, butalbital-acetaminophen-caffeine, calcium carbonate, clonazePAM, diclofenac sodium, diphenhydrAMINE, ergocalciferol, famotidine, fluticasone furoate-vilanterol, glucose blood, levocetirizine, levothyroxine, magnesium oxide, meclizine, metFORMIN, montelukast, naloxone, naphazoline-pheniramine, nitrofurantoin (macrocrystal-monohydrate), omeprazole, oxyCODONE, predniSONE, rizatriptan, rosuvastatin, tiZANidine, topiramate, and vitamin C  History Review  Allergy: Ms. Bennick is allergic to bactrim [sulfamethoxazole-trimethoprim], omalizumab,  ciprofloxacin, shellfish allergy, and nsaids. Drug: Ms. Foxworth  reports no history of drug use. Alcohol:  reports no history of alcohol use. Tobacco:  reports that she has never smoked. She has never used smokeless tobacco. Social: Ms. Ferrelli  reports that she has never smoked. She has  never used smokeless tobacco. She reports that she does not drink alcohol and does not use drugs. Medical:  has a past medical history of Anemia, Anginal pain (HCC), Anxiety, Arthralgia of hip (07/29/2015), Arthritis, Arthritis, degenerative (07/29/2015), Asthma, Cephalalgia (07/25/2014), Dependence on unknown drug (HCC), Depression, Diabetes mellitus without complication (HCC), Difficult intubation, Dysrhythmia, Eczema, Fibromyalgia, Gastritis, GERD (gastroesophageal reflux disease), Gonalgia (07/29/2015), Gout, H/O cardiovascular disorder (03/10/2015), H/O surgical procedure (12/05/2012), H/O thyroid disease (03/10/2015), Headache, Herpes, History of artificial joint (07/29/2015), History of hiatal hernia, Hypertension, Hypomagnesemia, Hypothyroidism, IDA (iron deficiency anemia) (05/28/2019), LBP (low back pain) (07/29/2015), Neuromuscular disorder (HCC), Obesity, PCOS (polycystic ovarian syndrome), Primary osteoarthritis of both knees (07/29/2015), Sleep apnea, Thyroid nodule (bilateral), and Umbilical hernia. Surgical: Ms. Kimes  has a past surgical history that includes Laparoscopic partial gastrectomy; Shoulder arthroscopy (Right); Carpal tunnel release (Bilateral); Diagnostic laparoscopy; Cholecystectomy; Trigger finger release (Right); Thyroidectomy (N/A, 11/12/2015); left trigger finger; Roux-en-Y Gastric Bypass (06/03/2017); Hiatal hernia repair; peniculectomy (N/A, 07/05/2018); Total hip arthroplasty (Right, 11/27/2019); Joint replacement (Bilateral, hip); Appendectomy; Trigger finger release (Right, 04/24/2020); Colonoscopy with propofol (N/A, 02/11/2021); and Esophagogastroduodenoscopy (egd) with propofol (N/A, 02/11/2021). Family: family history includes Alcohol abuse in her father and mother; Anxiety disorder in her father and mother; Brain cancer (age of onset: 30) in her paternal uncle; Breast cancer (age of onset: 85) in her paternal aunt and paternal aunt; COPD in her father; Depression in her brother,  father, and mother; Diabetes in her brother, father, and mother; Hypertension in her brother, father, and mother; Kidney cancer in her mother; Kidney failure in her father; Post-traumatic stress disorder in her father; Sleep apnea in her brother, father, and mother.  Laboratory Chemistry Profile   Renal Lab Results  Component Value Date   BUN 17 08/09/2022   CREATININE 1.04 (H) 08/09/2022   GFRAA >60 04/16/2020   GFRNONAA >60 08/09/2022    Hepatic Lab Results  Component Value Date   AST 25 08/07/2022   ALT 40 08/07/2022   ALBUMIN 3.5 08/07/2022   ALKPHOS 74 08/07/2022   LIPASE 37 08/07/2022    Electrolytes Lab Results  Component Value Date   NA 142 08/09/2022   K 4.8 08/09/2022   CL 116 (H) 08/09/2022   CALCIUM 7.8 (L) 08/09/2022   MG 2.5 (H) 08/07/2022   PHOS 5.5 (H) 05/22/2019    Bone Lab Results  Component Value Date   VD25OH 27.4 (L) 11/24/2015   VD125OH2TOT 41.5 11/24/2015    Inflammation (CRP: Acute Phase) (ESR: Chronic Phase) Lab Results  Component Value Date   CRP 0.7 09/27/2019   ESRSEDRATE 30 (H) 09/27/2019   LATICACIDVEN 1.7 08/07/2022         Note: Above Lab results reviewed.  Recent Imaging Review  NM Myocar Multi W/Spect W/Wall Motion / EF   The study is normal. The study is low risk.   No ST deviation was noted.   LV perfusion is normal. There is no evidence of ischemia. There is no  evidence of infarction.   Left ventricular function is normal. Nuclear stress EF: 65 %. The left  ventricular ejection fraction is normal (55-65%). End diastolic cavity  size is normal.   Prior study not available for comparison.  Normal Myoview. No evidence of ischemia. This is a low risk study. Note: Reviewed        Physical Exam  General appearance: Well nourished, well developed, and well hydrated. In no apparent acute distress Mental status: Alert, oriented x 3 (person, place, & time)       Respiratory: No evidence of acute respiratory distress Eyes:  PERLA Vitals: There were no vitals taken for this visit. BMI: Estimated body mass index is 60.94 kg/m as calculated from the following:   Height as of 11/24/22: 5\' 3"  (1.6 m).   Weight as of 11/24/22: 344 lb (156 kg). Ideal: Patient weight not recorded  Assessment   Diagnosis Status  No diagnosis found. Controlled Controlled Controlled   Updated Problems: No problems updated.  Plan of Care  Problem-specific:  No problem-specific Assessment & Plan notes found for this encounter.  Ms. TAJANIQUE CARRETE has a current medication list which includes the following long-term medication(s): albuterol, diphenhydramine, magnesium oxide, montelukast, oxycodone, oxycodone, oxycodone, rizatriptan, and rizatriptan.  Pharmacotherapy (Medications Ordered): No orders of the defined types were placed in this encounter.  Orders:  No orders of the defined types were placed in this encounter.  Follow-up plan:   No follow-ups on file.      Interventional Therapies  Risk  Complexity Considerations:   Estimated body mass index is 60.59 kg/m as calculated from the following:   Height as of 06/18/21: 5\' 4"  (1.626 m).   Weight as of 06/18/21: 353 lb (160.1 kg).     NOTE: NO RFA until BMI less or equal to 35.  (Gastric bypass done on 06/03/2017) Iodine allergy  Contrast dye allergy  Shellfish allergy    Planned  Pending:      Under consideration:   Diagnostic bilateral genicular NB    Completed:   Therapeutic right L2-3 LESI x1 (04/29/2022) (100/100/60/60)  Therapeutic bilateral IA Monovisc knee inj. x1 (05/25/2022)  Therapeutic right trapezius TPI/MNB x1 (05/25/2022)  Therapeutic bilateral IA Zilretta knee inj. x4 (03/25/2022) (100/100/80/80)  Therapeutic bilateral IA Hyalgan knee inj. x16 (06/24/2020) (100/100/60/60)  Palliative/therapeutic bilateral lumbar facet MBB x8 (08/25/2021) (100/100/60/60)  Therapeutic right IA hip injection x3 (07/07/2017) (100/50/25/>50)  Therapeutic left IA  shoulder (glenohumeral joint) injection x1 (05/31/2017) (50/0/100/100)    Completed by other providers:   Therapeutic bilateral THR (total hip replacements) (Dr. Rosita Kea) (Right: 11/27/2019) (Left: 07/29/2015)    Therapeutic  Palliative (PRN) options:   Palliative bilateral knee injections  Palliative bilateral lumbar facet MBBs    Pharmacotherapy  Nonopioid transferred 06/24/2020: Lyrica        Recent Visits Date Type Provider Dept  11/24/22 Office Visit Delano Metz, MD [facility]  Showing recent visits within past 90 days and meeting all other requirements Future Appointments Date Type Provider Dept  02/23/23 Appointment Delano Metz, MD [facility]  Showing future appointments within next 90 days and meeting all other requirements  I discussed the assessment and treatment plan with the patient. The patient was provided an opportunity to ask questions and all were answered. The patient agreed with the plan and demonstrated an understanding of the instructions.  Patient advised to call back or seek an in-person evaluation if the symptoms or condition worsens.  Duration of encounter: *** minutes.  Total time on encounter, as per AMA guidelines included both the face-to-face and non-face-to-face time personally spent by the physician and/or other qualified health care professional(s) on the day of the encounter (includes time in activities that require the  physician or other qualified health care professional and does not include time in activities normally performed by clinical staff). Physician's time may include the following activities when performed: Preparing to see the patient (e.g., pre-charting review of records, searching for previously ordered imaging, lab work, and nerve conduction tests) Review of prior analgesic pharmacotherapies. Reviewing PMP Interpreting ordered tests (e.g., lab work, imaging, nerve conduction tests) Performing  post-procedure evaluations, including interpretation of diagnostic procedures Obtaining and/or reviewing separately obtained history Performing a medically appropriate examination and/or evaluation Counseling and educating the patient/family/caregiver Ordering medications, tests, or procedures Referring and communicating with other health care professionals (when not separately reported) Documenting clinical information in the electronic or other health record Independently interpreting results (not separately reported) and communicating results to the patient/ family/caregiver Care coordination (not separately reported)  Note by: Oswaldo Done, MD Date: 02/23/2023; Time: 11:17 AM

## 2023-02-23 ENCOUNTER — Ambulatory Visit
Admission: RE | Admit: 2023-02-23 | Discharge: 2023-02-23 | Disposition: A | Discharge status: Home / Self Care | Payer: Medicare HMO | Source: Ambulatory Visit | Attending: Internal Medicine | Admitting: Internal Medicine

## 2023-02-23 ENCOUNTER — Ambulatory Visit (HOSPITAL_BASED_OUTPATIENT_CLINIC_OR_DEPARTMENT_OTHER): Payer: Medicare HMO | Admitting: Pain Medicine

## 2023-02-23 ENCOUNTER — Encounter: Payer: Self-pay | Admitting: Pain Medicine

## 2023-02-23 VITALS — BP 124/67 | HR 84 | Temp 97.6°F | Resp 18 | Ht 63.0 in | Wt 355.0 lb

## 2023-02-23 DIAGNOSIS — Z1231 Encounter for screening mammogram for malignant neoplasm of breast: Secondary | ICD-10-CM | POA: Insufficient documentation

## 2023-02-23 DIAGNOSIS — M25551 Pain in right hip: Secondary | ICD-10-CM | POA: Diagnosis not present

## 2023-02-23 DIAGNOSIS — Z79899 Other long term (current) drug therapy: Secondary | ICD-10-CM | POA: Diagnosis present

## 2023-02-23 DIAGNOSIS — M25562 Pain in left knee: Secondary | ICD-10-CM | POA: Diagnosis not present

## 2023-02-23 DIAGNOSIS — M47816 Spondylosis without myelopathy or radiculopathy, lumbar region: Secondary | ICD-10-CM | POA: Diagnosis not present

## 2023-02-23 DIAGNOSIS — Z79891 Long term (current) use of opiate analgesic: Secondary | ICD-10-CM

## 2023-02-23 DIAGNOSIS — M5137 Other intervertebral disc degeneration, lumbosacral region: Secondary | ICD-10-CM | POA: Diagnosis not present

## 2023-02-23 DIAGNOSIS — M25561 Pain in right knee: Secondary | ICD-10-CM | POA: Diagnosis not present

## 2023-02-23 DIAGNOSIS — M545 Low back pain, unspecified: Secondary | ICD-10-CM

## 2023-02-23 DIAGNOSIS — Z6841 Body Mass Index (BMI) 40.0 and over, adult: Secondary | ICD-10-CM

## 2023-02-23 DIAGNOSIS — Z9884 Bariatric surgery status: Secondary | ICD-10-CM | POA: Diagnosis present

## 2023-02-23 DIAGNOSIS — M153 Secondary multiple arthritis: Secondary | ICD-10-CM | POA: Insufficient documentation

## 2023-02-23 DIAGNOSIS — G8929 Other chronic pain: Secondary | ICD-10-CM | POA: Insufficient documentation

## 2023-02-23 DIAGNOSIS — G894 Chronic pain syndrome: Secondary | ICD-10-CM | POA: Insufficient documentation

## 2023-02-23 DIAGNOSIS — R937 Abnormal findings on diagnostic imaging of other parts of musculoskeletal system: Secondary | ICD-10-CM | POA: Insufficient documentation

## 2023-02-23 MED ORDER — OXYCODONE HCL 5 MG PO TABS
5.0000 mg | ORAL_TABLET | Freq: Three times a day (TID) | ORAL | 0 refills | Status: DC | PRN
Start: 2023-04-20 — End: 2023-06-08

## 2023-02-23 MED ORDER — OXYCODONE HCL 5 MG PO TABS
5.0000 mg | ORAL_TABLET | Freq: Three times a day (TID) | ORAL | 0 refills | Status: DC | PRN
Start: 2023-03-21 — End: 2023-06-08

## 2023-02-23 MED ORDER — OXYCODONE HCL 5 MG PO TABS
5.0000 mg | ORAL_TABLET | Freq: Three times a day (TID) | ORAL | 0 refills | Status: DC | PRN
Start: 2023-05-20 — End: 2023-06-08

## 2023-02-23 NOTE — Progress Notes (Signed)
Nursing Pain Medication Assessment:  Safety precautions to be maintained throughout the outpatient stay will include: orient to surroundings, keep bed in low position, maintain call bell within reach at all times, provide assistance with transfer out of bed and ambulation.  Medication Inspection Compliance: Pill count conducted under aseptic conditions, in front of the patient. Neither the pills nor the bottle was removed from the patient's sight at any time. Once count was completed pills were immediately returned to the patient in their original bottle.  Medication:  oxycodone Pill/Patch Count:  101 of 90 pills remain Pill/Patch Appearance: Markings consistent with prescribed medication Bottle Appearance: Standard pharmacy container. Clearly labeled. Filled Date: 06 / 08 / 2024 Last Medication intake:  Today  Pt states she added last month's leftover pills to the new bottle. Pt advised to not do that in the future, to bring current bottle in use as filled at pharmacy.

## 2023-02-23 NOTE — Patient Instructions (Signed)
____________________________________________________________________________________________  Opioid Pain Medication Update  To: All patients taking opioid pain medications. (I.e.: hydrocodone, hydromorphone, oxycodone, oxymorphone, morphine, codeine, methadone, tapentadol, tramadol, buprenorphine, fentanyl, etc.)  Re: Updated review of side effects and adverse reactions of opioid analgesics, as well as new information about long term effects of this class of medications.  Direct risks of long-term opioid therapy are not limited to opioid addiction and overdose. Potential medical risks include serious fractures, breathing problems during sleep, hyperalgesia, immunosuppression, chronic constipation, bowel obstruction, myocardial infarction, and tooth decay secondary to xerostomia.  Unpredictable adverse effects that can occur even if you take your medication correctly: Cognitive impairment, respiratory depression, and death. Most people think that if they take their medication "correctly", and "as instructed", that they will be safe. Nothing could be farther from the truth. In reality, a significant amount of recorded deaths associated with the use of opioids has occurred in individuals that had taken the medication for a long time, and were taking their medication correctly. The following are examples of how this can happen: Patient taking his/her medication for a long time, as instructed, without any side effects, is given a certain antibiotic or another unrelated medication, which in turn triggers a "Drug-to-drug interaction" leading to disorientation, cognitive impairment, impaired reflexes, respiratory depression or an untoward event leading to serious bodily harm or injury, including death.  Patient taking his/her medication for a long time, as instructed, without any side effects, develops an acute impairment of liver and/or kidney function. This will lead to a rapid inability of the body to  breakdown and eliminate their pain medication, which will result in effects similar to an "overdose", but with the same medicine and dose that they had always taken. This again may lead to disorientation, cognitive impairment, impaired reflexes, respiratory depression or an untoward event leading to serious bodily harm or injury, including death.  A similar problem will occur with patients as they grow older and their liver and kidney function begins to decrease as part of the aging process.  Background information: Historically, the original case for using long-term opioid therapy to treat chronic noncancer pain was based on safety assumptions that subsequent experience has called into question. In 1996, the American Pain Society and the American Academy of Pain Medicine issued a consensus statement supporting long-term opioid therapy. This statement acknowledged the dangers of opioid prescribing but concluded that the risk for addiction was low; respiratory depression induced by opioids was short-lived, occurred mainly in opioid-naive patients, and was antagonized by pain; tolerance was not a common problem; and efforts to control diversion should not constrain opioid prescribing. This has now proven to be wrong. Experience regarding the risks for opioid addiction, misuse, and overdose in community practice has failed to support these assumptions.  According to the Centers for Disease Control and Prevention, fatal overdoses involving opioid analgesics have increased sharply over the past decade. Currently, more than 96,700 people die from drug overdoses every year. Opioids are a factor in 7 out of every 10 overdose deaths. Deaths from drug overdose have surpassed motor vehicle accidents as the leading cause of death for individuals between the ages of 35 and 54.  Clinical data suggest that neuroendocrine dysfunction may be very common in both men and women, potentially causing hypogonadism, erectile  dysfunction, infertility, decreased libido, osteoporosis, and depression. Recent studies linked higher opioid dose to increased opioid-related mortality. Controlled observational studies reported that long-term opioid therapy may be associated with increased risk for cardiovascular events. Subsequent meta-analysis concluded   that the safety of long-term opioid therapy in elderly patients has not been proven.   Side Effects and adverse reactions: Common side effects: Drowsiness (sedation). Dizziness. Nausea and vomiting. Constipation. Physical dependence -- Dependence often manifests with withdrawal symptoms when opioids are discontinued or decreased. Tolerance -- As you take repeated doses of opioids, you require increased medication to experience the same effect of pain relief. Respiratory depression -- This can occur in healthy people, especially with higher doses. However, people with COPD, asthma or other lung conditions may be even more susceptible to fatal respiratory impairment.  Uncommon side effects: An increased sensitivity to feeling pain and extreme response to pain (hyperalgesia). Chronic use of opioids can lead to this. Delayed gastric emptying (the process by which the contents of your stomach are moved into your small intestine). Muscle rigidity. Immune system and hormonal dysfunction. Quick, involuntary muscle jerks (myoclonus). Arrhythmia. Itchy skin (pruritus). Dry mouth (xerostomia).  Long-term side effects: Chronic constipation. Sleep-disordered breathing (SDB). Increased risk of bone fractures. Hypothalamic-pituitary-adrenal dysregulation. Increased risk of overdose.  RISKS: Fractures and Falls:  Opioids increase the risk and incidence of falls. This is of particular importance in elderly patients.  Endocrine System:  Long-term administration is associated with endocrine abnormalities (endocrinopathies). (Also known as Opioid-induced Endocrinopathy) Influences  on both the hypothalamic-pituitary-adrenal axis?and the hypothalamic-pituitary-gonadal axis have been demonstrated with consequent hypogonadism and adrenal insufficiency in both sexes. Hypogonadism and decreased levels of dehydroepiandrosterone sulfate have been reported in men and women. Endocrine effects include: Amenorrhoea in women (abnormal absence of menstruation) Reduced libido in both sexes Decreased sexual function Erectile dysfunction in men Hypogonadisms (decreased testicular function with shrinkage of testicles) Infertility Depression and fatigue Loss of muscle mass Anxiety Depression Immune suppression Hyperalgesia Weight gain Anemia Osteoporosis Patients (particularly women of childbearing age) should avoid opioids. There is insufficient evidence to recommend routine monitoring of asymptomatic patients taking opioids in the long-term for hormonal deficiencies.  Immune System: Human studies have demonstrated that opioids have an immunomodulating effect. These effects are mediated via opioid receptors both on immune effector cells and in the central nervous system. Opioids have been demonstrated to have adverse effects on antimicrobial response and anti-tumour surveillance. Buprenorphine has been demonstrated to have no impact on immune function.  Opioid Induced Hyperalgesia: Human studies have demonstrated that prolonged use of opioids can lead to a state of abnormal pain sensitivity, sometimes called opioid induced hyperalgesia (OIH). Opioid induced hyperalgesia is not usually seen in the absence of tolerance to opioid analgesia. Clinically, hyperalgesia may be diagnosed if the patient on long-term opioid therapy presents with increased pain. This might be qualitatively and anatomically distinct from pain related to disease progression or to breakthrough pain resulting from development of opioid tolerance. Pain associated with hyperalgesia tends to be more diffuse than the  pre-existing pain and less defined in quality. Management of opioid induced hyperalgesia requires opioid dose reduction.  Cancer: Chronic opioid therapy has been associated with an increased risk of cancer among noncancer patients with chronic pain. This association was more evident in chronic strong opioid users. Chronic opioid consumption causes significant pathological changes in the small intestine and colon. Epidemiological studies have found that there is a link between opium dependence and initiation of gastrointestinal cancers. Cancer is the second leading cause of death after cardiovascular disease. Chronic use of opioids can cause multiple conditions such as GERD, immunosuppression and renal damage as well as carcinogenic effects, which are associated with the incidence of cancers.   Mortality: Long-term opioid use   has been associated with increased mortality among patients with chronic non-cancer pain (CNCP).  Prescription of long-acting opioids for chronic noncancer pain was associated with a significantly increased risk of all-cause mortality, including deaths from causes other than overdose.  Reference: Von Korff M, Kolodny A, Deyo RA, Chou R. Long-term opioid therapy reconsidered. Ann Intern Med. 2011 Sep 6;155(5):325-8. doi: 10.7326/[id number]. PMID: 16109604; PMCID: [id number]. Randon Goldsmith, Hayward RA, Dunn KM, [place] KP. Risk of adverse events in patients prescribed long-term opioids: A cohort study in the [place] [facility] Research Datalink. Eur J Pain. 2019 May;23(5):908-922. doi: 10.1002/ejp.1357. Epub 2019 Jan 31. PMID: 78295621. Colameco S, Coren JS, Ciervo CA. Continuous opioid treatment for chronic noncancer pain: a time for moderation in prescribing. Postgrad Med. 2009 Jul;121(4):61-6. doi: 10.3810/pgm.2009.07.2032. PMID: 30865784. William Hamburger RN, Durand SD, Blazina I, Cristopher Peru, Bougatsos C, Deyo RA. The  effectiveness and risks of long-term opioid therapy for chronic pain: a systematic review for a [employer] of [employer] Pathways to [employer]. Ann Intern Med. 2015 Feb 17;162(4):276-86. doi: 10.7326/M14-2559. PMID: 69629528. Caryl Bis [facility], [place] DM. NCHS Data Brief No. 22. [place]: Centers for Disease Control and Prevention; 2009. Sep, Increase in Fatal Poisonings Involving Opioid Analgesics in the [place], 1999-2006. Song IA, Choi HR, Oh TK. Long-term opioid use and mortality in patients with chronic non-cancer pain: Ten-year follow-up study in [place] from 2010 through 2019. EClinicalMedicine. 2022 Jul 18;51:101558. doi: 10.1016/j.eclinm.2022.413244. PMID: 01027253; PMCID: [id number]. Huser, W., Schubert, T., Vogelmann, T. et al. All-cause mortality in patients with long-term opioid therapy compared with non-opioid analgesics for chronic non-cancer pain: a database study. BMC Med 18, 162 (2020). http://lester.info/ Rashidian H, Karie Kirks, Malekzadeh R, Haghdoost AA. An Ecological Study of the Association between Opiate Use and Incidence of Cancers. Addict Health. 2016 Fall;8(4):252-260. PMID: 59563875; PMCID: [id number].  Our Goal: Our goal is to control your pain with means other than the use of opioid pain medications.  Our Recommendation: Talk to your physician about coming off of these medications. We can assist you with the tapering down and stopping these medicines. Based on the new information, even if you cannot completely stop the medication, a decrease in the dose may be associated with a lesser risk. Ask for other means of controlling the pain. Decrease or eliminate those factors that significantly contribute to your pain such as smoking, obesity, and a diet heavily tilted towards "inflammatory" nutrients.  Last Updated: 11/10/2022    ____________________________________________________________________________________________     ____________________________________________________________________________________________  Transfer of Pain Medication between Pharmacies  Re: 2023 DEA Clarification on existing regulation  Published on DEA Website: May 14, 2022  Title: Revised Regulation Allows DEA-Registered Pharmacies to Electrical engineer Prescriptions at a Patient's Request DEA Headquarters Division - [employer]  "Patients now have the ability to request their electronic prescription be transferred to another pharmacy without having to go back to their practitioner to initiate the request. This revised regulation went into effect on Monday, May 10, 2022.     At a patient's request, a DEA-registered retail pharmacy can now transfer an electronic prescription for a controlled substance (schedules II-V) to another DEA-registered retail pharmacy. Prior to this change, patients would have to go through their practitioner to cancel their prescription and have it re-issued to a different pharmacy. The process was taxing and time consuming for both patients and practitioners.    The Drug Enforcement Administration [facility]) published its intent to revise the  process for transferring electronic prescriptions on August 01, 2020.  The final rule was published in the federal register on April 08, 2022 and went into effect 30 days later.  Under the final rule, a prescription can only be transferred once between pharmacies, and only if allowed under existing state or other applicable law. The prescription must remain in its electronic form; may not be altered in any way; and the transfer must be communicated directly between two licensed pharmacists. It's important to note, any authorized refills transfer with the original prescription, which means the entire prescription will be filled at the same pharmacy."     REFERENCES: 1. DEA website announcement HugeHand.is  2. Department of Justice website  CheapWipes.at.pdf  3. DEPARTMENT OF JUSTICE Drug Enforcement Administration 21 CFR Part 1306 [Docket No. [id number]-637] RIN [id number]-[id number] "Transfer of Electronic Prescriptions for Schedules II-V Controlled Substances Between Pharmacies for Initial Filling"  ____________________________________________________________________________________________     _______________________________________________________________________  Medication Rules  Purpose: To inform patients, and their family members, of our medication rules and regulations.  Applies to: All patients receiving prescriptions from our practice (written or electronic).  Pharmacy of record: This is the pharmacy where your electronic prescriptions will be sent. Make sure we have the correct one.  Electronic prescriptions: In compliance with the [facility] Strengthen Opioid Misuse Prevention (STOP) Act of 2017 (Session Conni Elliot [phone]), effective September 13, 2018, all controlled substances must be electronically prescribed. Written prescriptions, faxing, or calling prescriptions to a pharmacy will no longer be done.  Prescription refills: These will be provided only during in-person appointments. No medications will be renewed without a "face-to-face" evaluation with your provider. Applies to all prescriptions.  NOTE: The following applies primarily to controlled substances (Opioid* Pain Medications).   Type of encounter (visit): For patients receiving controlled substances, face-to-face visits are required. (Not an option and not up to the patient.)  Patient's responsibilities: Pain Pills: Bring all pain pills to every appointment (except for procedure appointments). Pill Bottles: Bring  pills in original pharmacy bottle. Bring bottle, even if empty. Always bring the bottle of the most recent fill.  Medication refills: You are responsible for knowing and keeping track of what medications you are taking and when is it that you will need a refill. The day before your appointment: write a list of all prescriptions that need to be refilled. The day of the appointment: give the list to the admitting nurse. Prescriptions will be written only during appointments. No prescriptions will be written on procedure days. If you forget a medication: it will not be "Called in", "Faxed", or "electronically sent". You will need to get another appointment to get these prescribed. No early refills. Do not call asking to have your prescription filled early. Partial  or short prescriptions: Occasionally your pharmacy may not have enough pills to fill your prescription.  NEVER ACCEPT a partial fill or a prescription that is short of the total amount of pills that you were prescribed.  With controlled substances the law allows 72 hours for the pharmacy to complete the prescription.  If the prescription is not completed within 72 hours, the pharmacist will require a new prescription to be written. This means that you will be short on your medicine and we WILL NOT send another prescription to complete your original prescription.  Instead, request the pharmacy to send a carrier to a nearby branch to get enough medication to provide you with your full prescription. Prescription Accuracy: You are responsible for carefully inspecting your  prescriptions before leaving our office. Have the discharge nurse carefully go over each prescription with you, before taking them home. Make sure that your name is accurately spelled, that your address is correct. Check the name and dose of your medication to make sure it is accurate. Check the number of pills, and the written instructions to make sure they are clear and accurate. Make  sure that you are given enough medication to last until your next medication refill appointment. Taking Medication: Take medication as prescribed. When it comes to controlled substances, taking less pills or less frequently than prescribed is permitted and encouraged. Never take more pills than instructed. Never take the medication more frequently than prescribed.  Inform other Doctors: Always inform, all of your healthcare providers, of all the medications you take. Pain Medication from other Providers: You are not allowed to accept any additional pain medication from any other Doctor or Healthcare provider. There are two exceptions to this rule. (see below) In the event that you require additional pain medication, you are responsible for notifying us, as stated below. Cough Medicine: Often these contain an opioid, such as codeine or hydrocodone. Never accept or take cough medicine containing these opioids if you are already taking an opioid* medication. The combination may cause respiratory failure and death. Medication Agreement: You are responsible for carefully reading and following our Medication Agreement. This must be signed before receiving any prescriptions from our practice. Safely store a copy of your signed Agreement. Violations to the Agreement will result in no further prescriptions. (Additional copies of our Medication Agreement are available upon request.) Laws, Rules, & Regulations: All patients are expected to follow all [address] and [employer], Rules, [place] [employer]. Ignorance of the Laws does not constitute a valid excuse.  Illegal drugs and Controlled Substances: The use of illegal substances (including, but not limited to marijuana and its derivatives) and/or the illegal use of any controlled substances is strictly prohibited. Violation of this rule may result in the immediate and permanent discontinuation of any and all prescriptions being written by our practice. The use of  any illegal substances is prohibited. Adopted CDC guidelines & recommendations: Target dosing levels will be at or below 60 MME/day. Use of benzodiazepines** is not recommended.  Exceptions: There are only two exceptions to the rule of not receiving pain medications from other Healthcare Providers. Exception #1 (Emergencies): In the event of an emergency (i.e.: accident requiring emergency care), you are allowed to receive additional pain medication. However, you are responsible for: As soon as you are able, call our office [phone], at any time of the day or night, and leave a message stating your name, the date and nature of the emergency, and the name and dose of the medication prescribed. In the event that your call is answered by a member of our staff, make sure to document and save the date, time, and the name of the person that took your information.  Exception #2 (Planned Surgery): In the event that you are scheduled by another doctor or dentist to have any type of surgery or procedure, you are allowed (for a period no longer than 30 days), to receive additional pain medication, for the acute post-op pain. However, in this case, you are responsible for picking up a copy of our "Post-op Pain Management for Surgeons" handout, and giving it to your surgeon or dentist. This document is available at our office, and does not require an appointment to obtain it. Simply go to  our office during business hours (Monday-Thursday from 8:00 AM to 4:00 PM) (Friday 8:00 AM to 12:00 Noon) or if you have a scheduled appointment with [place], prior to your surgery, and ask for it by name. In addition, you are responsible for: calling our office [phone], at any time of the day or night, and leaving a message stating your name, name of your surgeon, type of surgery, and date of procedure or surgery. Failure to comply with your responsibilities may result in termination of therapy involving the controlled  substances. Medication Agreement Violation. Following the above rules, including your responsibilities will help you in avoiding a Medication Agreement Violation ("Breaking your Pain Medication Contract").  Consequences:  Not following the above rules may result in permanent discontinuation of medication prescription therapy.  *Opioid medications include: morphine, codeine, oxycodone, oxymorphone, hydrocodone, hydromorphone, meperidine, tramadol, tapentadol, buprenorphine, fentanyl, methadone. **Benzodiazepine medications include: diazepam (Valium), alprazolam (Xanax), clonazepam (Klonopine), lorazepam (Ativan), clorazepate (Tranxene), chlordiazepoxide (Librium), estazolam (Prosom), oxazepam (Serax), temazepam (Restoril), triazolam (Halcion) (Last updated: 07/06/2022) ______________________________________________________________________    ______________________________________________________________________  Medication Recommendations and Reminders  Applies to: All patients receiving prescriptions (written and/or electronic).  Medication Rules & Regulations: You are responsible for reading, knowing, and following our "Medication Rules" document. These exist for your safety and that of others. They are not flexible and neither are we. Dismissing or ignoring them is an act of "non-compliance" that may result in complete and irreversible termination of such medication therapy. For safety reasons, "non-compliance" will not be tolerated. As with the [place] fundamental legal principle of "ignorance of the law is no defense", we will accept no excuses for not having read and knowing the content of documents provided to you by our practice.  Pharmacy of record:  Definition: This is the pharmacy where your electronic prescriptions will be sent.  We do not endorse any particular pharmacy. It is up to you and your insurance to decide what pharmacy to use.  We do not restrict you in your choice of  pharmacy. However, once we write for your prescriptions, we will NOT be re-sending more prescriptions to fix restricted supply problems created by your pharmacy, or your insurance.  The pharmacy listed in the electronic medical record should be the one where you want electronic prescriptions to be sent. If you choose to change pharmacy, simply notify our nursing staff. Changes will be made only during your regular appointments and not over the phone.  Recommendations: Keep all of your pain medications in a safe place, under lock and key, even if you live alone. We will NOT replace lost, stolen, or damaged medication. We do not accept "Police Reports" as proof of medications having been stolen. After you fill your prescription, take 1 week's worth of pills and put them away in a safe place. You should keep a separate, properly labeled bottle for this purpose. The remainder should be kept in the original bottle. Use this as your primary supply, until it runs out. Once it's gone, then you know that you have 1 week's worth of medicine, and it is time to come in for a prescription refill. If you do this correctly, it is unlikely that you will ever run out of medicine. To make sure that the above recommendation works, it is very important that you make sure your medication refill appointments are scheduled at least 1 week before you run out of medicine. To do this in an effective manner, make sure that you do not leave the office without  scheduling your next medication management appointment. Always ask the nursing staff to show you in your prescription , when your medication will be running out. Then arrange for the receptionist to get you a return appointment, at least 7 days before you run out of medicine. Do not wait until you have 1 or 2 pills left, to come in. This is very poor planning and does not take into consideration that we may need to cancel appointments due to bad weather, sickness, or emergencies  affecting our staff. DO NOT ACCEPT A "Partial Fill": If for any reason your pharmacy does not have enough pills/tablets to completely fill or refill your prescription, do not allow for a "partial fill". The law allows the pharmacy to complete that prescription within 72 hours, without requiring a new prescription. If they do not fill the rest of your prescription within those 72 hours, you will need a separate prescription to fill the remaining amount, which we will NOT provide. If the reason for the partial fill is your insurance, you will need to talk to the pharmacist about payment alternatives for the remaining tablets, but again, DO NOT ACCEPT A PARTIAL FILL, unless you can trust your pharmacist to obtain the remainder of the pills within 72 hours.  Prescription refills and/or changes in medication(s):  Prescription refills, and/or changes in dose or medication, will be conducted only during scheduled medication management appointments. (Applies to both, written and electronic prescriptions.) No refills on procedure days. No medication will be changed or started on procedure days. No changes, adjustments, and/or refills will be conducted on a procedure day. Doing so will interfere with the diagnostic portion of the procedure. No phone refills. No medications will be "called into the pharmacy". No Fax refills. No weekend refills. No Holliday refills. No after hours refills.  Remember:  Business hours are:  Monday to Thursday 8:00 AM to 4:00 PM Provider's Schedule: Delano Metz, MD - Appointments are:  Medication management: Monday and Wednesday 8:00 AM to 4:00 PM Procedure day: Tuesday and Thursday 7:30 AM to 4:00 PM Edward Jolly, MD - Appointments are:  Medication management: Tuesday and Thursday 8:00 AM to 4:00 PM Procedure day: Monday and Wednesday 7:30 AM to 4:00 PM (Last update: 07/06/2022) ______________________________________________________________________    ____________________________________________________________________________________________  Naloxone Nasal Spray  Why am I receiving this medication? [place] STOP ACT requires that all patients taking high dose opioids or at risk of opioids respiratory depression, be prescribed an opioid reversal agent, such as Naloxone (AKA: Narcan).  What is this medication? NALOXONE (nal OX one) treats opioid overdose, which causes slow or shallow breathing, severe drowsiness, or trouble staying awake. Call emergency services after using this medication. You may need additional treatment. Naloxone works by reversing the effects of opioids. It belongs to a group of medications called opioid blockers.  COMMON BRAND NAME(S): Kloxxado, Narcan  What should I tell my care team before I take this medication? They need to know if you have any of these conditions: Heart disease Substance use disorder An unusual or allergic reaction to naloxone, other medications, foods, dyes, or preservatives Pregnant or trying to get pregnant Breast-feeding  When to use this medication? This medication is to be used for the treatment of respiratory depression (less than 8 breaths per minute) secondary to opioid overdose.   How to use this medication? This medication is for use in the nose. Lay the person on their back. Support their neck with your hand and allow the head to tilt  back before giving the medication. The nasal spray should be given into 1 nostril. After giving the medication, move the person onto their side. Do not remove or test the nasal spray until ready to use. Get emergency medical help right away after giving the first dose of this medication, even if the person wakes up. You should be familiar with how to recognize the signs and symptoms of a narcotic overdose. If more doses are needed, give the additional dose in the other nostril. Talk to your care team about the use of this medication in children.  While this medication may be prescribed for children as young as newborns for selected conditions, precautions do apply.  Naloxone Overdosage: If you think you have taken too much of this medicine contact a poison control center or emergency room at once.  NOTE: This medicine is only for you. Do not share this medicine with others.  What if I miss a dose? This does not apply.  What may interact with this medication? This is only used during an emergency. No interactions are expected during emergency use. This list may not describe all possible interactions. Give your health care provider a list of all the medicines, herbs, non-prescription drugs, or dietary supplements you use. Also tell them if you smoke, drink alcohol, or use illegal drugs. Some items may interact with your medicine.  What should I watch for while using this medication? Keep this medication ready for use in the case of an opioid overdose. Make sure that you have the phone number of your care team and local hospital ready. You may need to have additional doses of this medication. Each nasal spray contains a single dose. Some emergencies may require additional doses. After use, bring the treated person to the nearest hospital or call 911. Make sure the treating care team knows that the person has received a dose of this medication. You will receive additional instructions on what to do during and after use of this medication before an emergency occurs.  What side effects may I notice from receiving this medication? Side effects that you should report to your care team as soon as possible: Allergic reactions--skin rash, itching, hives, swelling of the face, lips, tongue, or throat Side effects that usually do not require medical attention (report these to your care team if they continue or are bothersome): Constipation Dryness or irritation inside the nose Headache Increase in blood pressure Muscle spasms Stuffy  nose Toothache This list may not describe all possible side effects. Call your doctor for medical advice about side effects. You may report side effects to FDA at 1-800-FDA-1088.  Where should I keep my medication? Because this is an emergency medication, you should keep it with you at all times.  Keep out of the reach of children and pets. Store between 20 and 25 degrees C (68 and 77 degrees F). Do not freeze. Throw away any unused medication after the expiration date. Keep in original box until ready to use.  NOTE: This sheet is a summary. It may not cover all possible information. If you have questions about this medicine, talk to your doctor, pharmacist, or health care provider.   2023 Elsevier/Gold Standard (2021-05-08 00:00:00)  ____________________________________________________________________________________________

## 2023-02-25 DIAGNOSIS — G43719 Chronic migraine without aura, intractable, without status migrainosus: Secondary | ICD-10-CM | POA: Diagnosis not present

## 2023-03-04 DIAGNOSIS — Z0100 Encounter for examination of eyes and vision without abnormal findings: Secondary | ICD-10-CM | POA: Diagnosis not present

## 2023-03-04 DIAGNOSIS — H43393 Other vitreous opacities, bilateral: Secondary | ICD-10-CM | POA: Diagnosis not present

## 2023-03-04 DIAGNOSIS — H524 Presbyopia: Secondary | ICD-10-CM | POA: Diagnosis not present

## 2023-03-08 NOTE — Telephone Encounter (Signed)
Please schedule her a f/u with me when she is ready. Please make it a 60 minute appointment.

## 2023-03-10 DIAGNOSIS — F331 Major depressive disorder, recurrent, moderate: Secondary | ICD-10-CM | POA: Diagnosis not present

## 2023-03-11 DIAGNOSIS — R12 Heartburn: Secondary | ICD-10-CM | POA: Diagnosis not present

## 2023-03-11 DIAGNOSIS — Z9884 Bariatric surgery status: Secondary | ICD-10-CM | POA: Diagnosis not present

## 2023-03-11 DIAGNOSIS — Z980 Intestinal bypass and anastomosis status: Secondary | ICD-10-CM | POA: Diagnosis not present

## 2023-03-11 DIAGNOSIS — R635 Abnormal weight gain: Secondary | ICD-10-CM | POA: Diagnosis not present

## 2023-03-14 ENCOUNTER — Ambulatory Visit: Payer: Medicare HMO | Admitting: Urology

## 2023-03-14 VITALS — BP 136/78 | HR 80 | Ht 63.0 in | Wt 355.0 lb

## 2023-03-14 DIAGNOSIS — Z09 Encounter for follow-up examination after completed treatment for conditions other than malignant neoplasm: Secondary | ICD-10-CM

## 2023-03-14 DIAGNOSIS — Z87440 Personal history of urinary (tract) infections: Secondary | ICD-10-CM | POA: Diagnosis not present

## 2023-03-14 DIAGNOSIS — N390 Urinary tract infection, site not specified: Secondary | ICD-10-CM

## 2023-03-14 LAB — URINALYSIS, COMPLETE
Bilirubin, UA: NEGATIVE
Glucose, UA: NEGATIVE
Leukocytes,UA: NEGATIVE
Nitrite, UA: NEGATIVE
RBC, UA: NEGATIVE
Specific Gravity, UA: 1.02 (ref 1.005–1.030)
Urobilinogen, Ur: 1 mg/dL (ref 0.2–1.0)
pH, UA: 7 (ref 5.0–7.5)

## 2023-03-14 LAB — MICROSCOPIC EXAMINATION

## 2023-03-14 MED ORDER — NITROFURANTOIN MACROCRYSTAL 100 MG PO CAPS
100.0000 mg | ORAL_CAPSULE | Freq: Four times a day (QID) | ORAL | 11 refills | Status: AC
Start: 2023-03-14 — End: 2024-02-07

## 2023-03-14 NOTE — Progress Notes (Signed)
03/14/2023 1:08 PM   Karen Lewis Feb 01, 1971 [id number]  Referring provider: Marguarite Arbour, MD [address] [facility] [place],  [place] [address]  No chief complaint on file.   HPI: he patient reports an infection at least every 3 months when she feels tired foul-smelling urine burning and respond favorably antibiotics.  She had the same problem years ago.  She recently had MRSA.  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  Gets up once a night.  No hysterectomy.     Work-up and pathophysiology of chronic cystitis discussed.  Call if ultrasound normal.  Call culture normal.  Reassess in 6 weeks on daily Macrodantin 100 mg 3x11   Today Frequency stable.  Last culture negative.  Ultrasound normal. Infection free on Macrodantin.  Very pleased     Today Patient saw a nurse practitioner in November 2022 with a breakthrough infection and positive culture.  I have not seen her for a year. Recent urine culture a few days ago by primary care normal.  Clinically not infected today  0x11 Macrodantin sent to pharmacy. I will see in a year. If she starts getting too many breakthrough infections we will switch prophylaxis   Today Patient has been on and off her prophylaxis because she has been on many medications for bronchitis pulmonary issues and facial issues.  She did have 1 infection in between these other issues being treated when she was off the Macrodantin.  Clinically not infected today.  Frequency stable   PMH: Past Medical History:  Diagnosis Date   Anemia    Anginal pain (HCC)    Anxiety    Arthralgia of hip 07/29/2015   Arthritis    Arthritis, degenerative 07/29/2015   Asthma    Cephalalgia 07/25/2014   Dependence on unknown drug (HCC)    multiplt controlled drug dependence   Depression    Diabetes mellitus without  complication (HCC)    Difficult intubation    per pt needs small tube (has had abrasions from tube in past)   Dysrhythmia    PVC's   Eczema    Fibromyalgia    Gastritis    GERD (gastroesophageal reflux disease)    Gonalgia 07/29/2015   Overview:  Overview:  The patient has had bilateral intra-articular Hyalgan injections done on 07/16/2014 and although she seems to do well with this type of therapy, apparently her insurance company does not want to pay for they Hyalgan. On 11/27/2014 the patient underwent a bilateral genicular nerve block with excellent results. On 01/28/2015 she had a right knee genicular radiofrequency ablatio   Gout    H/O cardiovascular disorder 03/10/2015   H/O surgical procedure 12/05/2012   Overview:  LSG ([facility] - April 2013)     H/O thyroid disease 03/10/2015   Headache    Herpes    History of artificial joint 07/29/2015   History of hiatal hernia    Hypertension    Hypomagnesemia    Hypothyroidism    IDA (iron deficiency anemia) 05/28/2019   LBP (low back pain) 07/29/2015   Neuromuscular disorder (HCC)    Obesity    PCOS (polycystic ovarian syndrome)    Primary osteoarthritis of both knees 07/29/2015   Sleep apnea    not using a Cpap machine at this time - most recent test mild  apnea does not qualify for test (not OSA)   Thyroid nodule bilateral   Umbilical hernia     Surgical History: Past Surgical History:  Procedure Laterality Date   APPENDECTOMY     CARPAL TUNNEL RELEASE Bilateral    CHOLECYSTECTOMY     COLONOSCOPY WITH PROPOFOL N/A 02/11/2021   Procedure: COLONOSCOPY WITH PROPOFOL;  Surgeon: Toledo, Boykin Nearing, MD;  Location: [facility];  Service: Gastroenterology;  Laterality: N/A;   DIAGNOSTIC LAPAROSCOPY     ESOPHAGOGASTRODUODENOSCOPY (EGD) WITH PROPOFOL N/A 02/11/2021   Procedure: ESOPHAGOGASTRODUODENOSCOPY (EGD) WITH PROPOFOL;  Surgeon: Toledo, Boykin Nearing, MD;  Location: [facility];  Service: Gastroenterology;  Laterality: N/A;    HIATAL HERNIA REPAIR     JOINT REPLACEMENT Bilateral hip   LAPAROSCOPIC PARTIAL GASTRECTOMY     left trigger finger     peniculectomy N/A 07/05/2018   ROUX-EN-Y GASTRIC BYPASS  06/03/2017   SHOULDER ARTHROSCOPY Right    THYROIDECTOMY N/A 11/12/2015   Procedure: THYROIDECTOMY;  Surgeon: Geanie Logan, MD;  Location: [facility];  Service: ENT;  Laterality: N/A;   TOTAL HIP ARTHROPLASTY Right 11/27/2019   Procedure: TOTAL HIP ARTHROPLASTY ANTERIOR APPROACH;  Surgeon: Kennedy Bucker, MD;  Location: [facility];  Service: Orthopedics;  Laterality: Right;   TRIGGER FINGER RELEASE Right    TRIGGER FINGER RELEASE Right 04/24/2020   Procedure: Right ring and middle trigger release;  Surgeon: Kennedy Bucker, MD;  Location: [facility];  Service: Orthopedics;  Laterality: Right;    Home Medications:  Allergies as of 03/14/2023       Reactions   Bactrim [sulfamethoxazole-trimethoprim] Hives   Omalizumab Itching, Hives   Ciprofloxacin Other (See Comments)   myalgia   Shellfish Allergy Other (See Comments)   + positive allergy test   Nsaids Nausea Only, Other (See Comments)   Stomach upset        Medication List        Accurate as of March 14, 2023  1:08 PM. If you have any questions, ask your nurse or doctor.          Accu-Chek Aviva Plus test strip Generic drug: glucose blood Use 2 (two) times daily   Accu-Chek Aviva Plus test strip Generic drug: glucose blood   acetaminophen 650 MG CR tablet Commonly known as: TYLENOL Take 650-1,300 mg by mouth every 8 (eight) hours as needed for pain.   acidophilus Caps capsule Take 1 capsule by mouth daily.   albuterol 108 (90 Base) MCG/ACT inhaler Commonly known as: VENTOLIN HFA Inhale 2 puffs into the lungs every 6 (six) hours as needed for wheezing or shortness of breath.   amitriptyline 75 MG tablet Commonly known as: ELAVIL Take 100 mg by mouth at bedtime.   Biotin 10 MG Caps Take 10 mg by mouth daily.   butalbital-acetaminophen-caffeine  50-325-40 MG tablet Commonly known as: FIORICET Take 1 tablet by mouth every 6 (six) hours as needed for migraine.   calcium carbonate 750 MG chewable tablet Commonly known as: TUMS EX Chew 1 tablet by mouth daily.   clonazePAM 0.5 MG tablet Commonly known as: KLONOPIN Take 0.5 mg by mouth 2 (two) times daily as needed for anxiety.   diclofenac sodium 1 % Gel Commonly known as: Voltaren Apply 2 g topically 4 (four) times daily. What changed:  when to take this reasons to take this   diphenhydrAMINE 25 MG tablet Commonly known as: BENADRYL Take 25 mg by mouth every 8 (eight) hours as needed for itching or allergies.   DULoxetine  60 MG capsule Commonly known as: CYMBALTA Take 60 mg by mouth daily.   Emgality 120 MG/ML Soaj Generic drug: Galcanezumab-gnlm Inject 120 mg into the skin every 30 (thirty) days.   EPINEPHrine 0.3 mg/0.3 mL Soaj injection Commonly known as: EPI-PEN Inject 0.3 mg into the muscle as needed for anaphylaxis.   ergocalciferol 1.25 MG (50000 UT) capsule Commonly known as: VITAMIN D2 Take 50,000 Units by mouth 2 (two) times a week.   famotidine 20 MG tablet Commonly known as: PEPCID Take 20 mg by mouth at bedtime.   fluticasone furoate-vilanterol 200-25 MCG/INH Aepb Commonly known as: BREO ELLIPTA Inhale 1 puff into the lungs daily.   levocetirizine 5 MG tablet Commonly known as: XYZAL Take 5 mg by mouth at bedtime.   levothyroxine 175 MCG tablet Commonly known as: SYNTHROID Take 175 mcg by mouth daily before breakfast.   magnesium oxide 400 MG tablet Commonly known as: MAG-OX Take 1,200 mg by mouth 2 (two) times daily. Take 1200 mg by mouth in the morning and 1200 mg at bedtime   meclizine 25 MG tablet Commonly known as: ANTIVERT Take 25 mg by mouth 3 (three) times daily as needed for dizziness.   metFORMIN 500 MG 24 hr tablet Commonly known as: GLUCOPHAGE-XR Take 1,000 mg by mouth 2 (two) times daily.   montelukast 10 MG  tablet Commonly known as: SINGULAIR Take 10 mg by mouth at bedtime.   naloxone 4 MG/0.1ML Liqd nasal spray kit Commonly known as: NARCAN Place 1 spray into the nose as needed for up to 365 doses (for opioid-induced respiratory depresssion). In case of emergency (overdose), spray once into each nostril. If no response within 3 minutes, repeat application and call 911.   naphazoline-pheniramine 0.025-0.3 % ophthalmic solution Commonly known as: NAPHCON-A Place 1 drop into both eyes 4 (four) times daily as needed for eye irritation.   omeprazole 40 MG capsule Commonly known as: PRILOSEC Take 40 mg by mouth in the morning and at bedtime.   oxyCODONE 5 MG immediate release tablet Commonly known as: Oxy IR/ROXICODONE Take 1 tablet (5 mg total) by mouth every 8 (eight) hours as needed for severe pain. Must last 30 days Start taking on: March 21, 2023   oxyCODONE 5 MG immediate release tablet Commonly known as: Oxy IR/ROXICODONE Take 1 tablet (5 mg total) by mouth every 8 (eight) hours as needed for severe pain. Must last 30 days Start taking on: April 20, 2023   oxyCODONE 5 MG immediate release tablet Commonly known as: Oxy IR/ROXICODONE Take 1 tablet (5 mg total) by mouth every 8 (eight) hours as needed for severe pain. Must last 30 days Start taking on: May 20, 2023   Ozempic (0.25 or 0.5 MG/DOSE) 2 MG/1.5ML Sopn Generic drug: Semaglutide(0.25 or 0.5MG /DOS) Inject 1 mg into the skin once a week.   rizatriptan 10 MG disintegrating tablet Commonly known as: MAXALT-MLT Take 10 mg by mouth. Take 1 and may repeat in 2 hours  for Migraines   rizatriptan 10 MG tablet Commonly known as: MAXALT Take 1 and may repeat in 2 hours for MIgraines, alternate with the disintrgrating tablet.   rosuvastatin 40 MG tablet Commonly known as: CRESTOR Take 40 mg by mouth daily.   Tab-A-Vite Tabs Take 1 tablet by mouth daily.   tiZANidine 4 MG tablet Commonly known as: ZANAFLEX Take 1 tablet  (4 mg total) by mouth 2 (two) times daily as needed for muscle spasms.   topiramate 50 MG tablet Commonly known as:  TOPAMAX Take 100 mg by mouth at bedtime.   vitamin C 1000 MG tablet Take 1,000 mg by mouth daily.        Allergies:  Allergies  Allergen Reactions   Bactrim [Sulfamethoxazole-Trimethoprim] Hives   Omalizumab Itching and Hives   Ciprofloxacin Other (See Comments)    myalgia    Shellfish Allergy Other (See Comments)    + positive allergy test   Nsaids Nausea Only and Other (See Comments)    Stomach upset    Family History: Family History  Problem Relation Age of Onset   Anxiety disorder Mother    Depression Mother    Alcohol abuse Mother    Diabetes Mother    Hypertension Mother    Kidney cancer Mother    Sleep apnea Mother    Alcohol abuse Father    Anxiety disorder Father    Depression Father    Post-traumatic stress disorder Father    Kidney failure Father    COPD Father    Diabetes Father    Hypertension Father    Sleep apnea Father    Depression Brother    Diabetes Brother    Hypertension Brother    Sleep apnea Brother    Breast cancer Paternal Aunt 39   Breast cancer Paternal Aunt 29   Brain cancer Paternal Uncle 52   Bladder Cancer Neg Hx    Prostate cancer Neg Hx     Social History:  reports that she has never smoked. She has never used smokeless tobacco. She reports that she does not drink alcohol and does not use drugs.  ROS:                                        Physical Exam: There were no vitals taken for this visit.  Constitutional:  Alert and oriented, No acute distress. HEENT: [place] AT, moist mucus membranes.  Trachea midline, no masses.   Laboratory Data: Lab Results  Component Value Date   WBC 14.8 (H) 11/24/2022   HGB 14.6 11/24/2022   HCT 46.6 (H) 11/24/2022   MCV 88.6 11/24/2022   PLT 354 11/24/2022    Lab Results  Component Value Date   CREATININE 1.04 (H) 08/09/2022    No  results found for: "PSA"  No results found for: "TESTOSTERONE"  Lab Results  Component Value Date   HGBA1C 7.6 (H) 08/07/2022    Urinalysis    Component Value Date/Time   COLORURINE AMBER (A) 08/07/2022 1725   APPEARANCEUR CLEAR 08/07/2022 1725   APPEARANCEUR Cloudy (A) 08/04/2021 1044   LABSPEC 1.005 08/07/2022 1725   LABSPEC 1.003 03/25/2013 2018   PHURINE 5.0 08/07/2022 1725   GLUCOSEU NEGATIVE 08/07/2022 1725   GLUCOSEU Negative 03/25/2013 2018   HGBUR NEGATIVE 08/07/2022 1725   BILIRUBINUR NEGATIVE 08/07/2022 1725   BILIRUBINUR Negative 08/04/2021 1044   BILIRUBINUR Negative 03/25/2013 2018   KETONESUR NEGATIVE 08/07/2022 1725   PROTEINUR NEGATIVE 08/07/2022 1725   UROBILINOGEN 0.2 08/04/2020 1345   UROBILINOGEN 0.2 05/09/2015 0115   NITRITE POSITIVE (A) 08/07/2022 1725   LEUKOCYTESUR MODERATE (A) 08/07/2022 1725   LEUKOCYTESUR 2+ 03/25/2013 2018    Pertinent Imaging:   Assessment & Plan: We both agreed to go back on Macrodantin 100 mg daily 30 x 11 I will see in a year   1. Recurrent UTI  - Urinalysis, Complete   No follow-ups on file.  Sylina Henion  Reola Mosher, MD  [facility] [address] [place], [place] [address] [phone]
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
## 2023-04-05 ENCOUNTER — Ambulatory Visit: Payer: Medicare HMO | Admitting: Orthopedic Surgery

## 2023-04-05 DIAGNOSIS — E785 Hyperlipidemia, unspecified: Secondary | ICD-10-CM | POA: Diagnosis not present

## 2023-04-05 DIAGNOSIS — E118 Type 2 diabetes mellitus with unspecified complications: Secondary | ICD-10-CM | POA: Diagnosis not present

## 2023-04-05 DIAGNOSIS — R519 Headache, unspecified: Secondary | ICD-10-CM | POA: Diagnosis not present

## 2023-04-05 DIAGNOSIS — E1159 Type 2 diabetes mellitus with other circulatory complications: Secondary | ICD-10-CM | POA: Diagnosis not present

## 2023-04-05 DIAGNOSIS — R000 Tachycardia, unspecified: Secondary | ICD-10-CM | POA: Diagnosis not present

## 2023-04-05 DIAGNOSIS — I10 Essential (primary) hypertension: Secondary | ICD-10-CM | POA: Diagnosis not present

## 2023-04-05 DIAGNOSIS — E119 Type 2 diabetes mellitus without complications: Secondary | ICD-10-CM | POA: Diagnosis not present

## 2023-04-05 DIAGNOSIS — Z79899 Other long term (current) drug therapy: Secondary | ICD-10-CM | POA: Diagnosis not present

## 2023-04-05 DIAGNOSIS — Z6841 Body Mass Index (BMI) 40.0 and over, adult: Secondary | ICD-10-CM | POA: Diagnosis not present

## 2023-04-05 DIAGNOSIS — Z0000 Encounter for general adult medical examination without abnormal findings: Secondary | ICD-10-CM | POA: Diagnosis not present

## 2023-04-05 DIAGNOSIS — E209 Hypoparathyroidism, unspecified: Secondary | ICD-10-CM | POA: Diagnosis not present

## 2023-04-05 DIAGNOSIS — G4733 Obstructive sleep apnea (adult) (pediatric): Secondary | ICD-10-CM | POA: Diagnosis not present

## 2023-04-05 DIAGNOSIS — R0602 Shortness of breath: Secondary | ICD-10-CM | POA: Diagnosis not present

## 2023-04-05 DIAGNOSIS — M170 Bilateral primary osteoarthritis of knee: Secondary | ICD-10-CM | POA: Diagnosis not present

## 2023-04-05 DIAGNOSIS — Z9884 Bariatric surgery status: Secondary | ICD-10-CM | POA: Diagnosis not present

## 2023-04-05 DIAGNOSIS — M797 Fibromyalgia: Secondary | ICD-10-CM | POA: Diagnosis not present

## 2023-04-05 DIAGNOSIS — Z01818 Encounter for other preprocedural examination: Secondary | ICD-10-CM | POA: Diagnosis not present

## 2023-04-05 DIAGNOSIS — R0789 Other chest pain: Secondary | ICD-10-CM | POA: Diagnosis not present

## 2023-04-05 DIAGNOSIS — M545 Low back pain, unspecified: Secondary | ICD-10-CM | POA: Diagnosis not present

## 2023-04-05 DIAGNOSIS — E079 Disorder of thyroid, unspecified: Secondary | ICD-10-CM | POA: Diagnosis not present

## 2023-04-05 DIAGNOSIS — G479 Sleep disorder, unspecified: Secondary | ICD-10-CM | POA: Diagnosis not present

## 2023-04-05 DIAGNOSIS — G8929 Other chronic pain: Secondary | ICD-10-CM | POA: Diagnosis not present

## 2023-04-05 DIAGNOSIS — H538 Other visual disturbances: Secondary | ICD-10-CM | POA: Diagnosis not present

## 2023-04-12 DIAGNOSIS — F331 Major depressive disorder, recurrent, moderate: Secondary | ICD-10-CM | POA: Diagnosis not present

## 2023-04-12 NOTE — Progress Notes (Signed)
Telephone Visit- Progress Note: Referring Physician:  Marguarite Arbour, MD [address] [facility] [place],  [place] [address]  Primary Physician:  Karen Arbour, MD  This visit was performed via telephone.  Patient location: home Provider location: office  I spent a total of 8 minutes non-face-to-face activities for this visit on the date of this encounter including review of current clinical condition and response to treatment.    Patient has given verbal consent to this telephone visits and we reviewed the limitations of a telephone visit. Patient wishes to proceed.    Chief Complaint:  follow up  History of Present Illness: Karen Lewis is a 52 y.o. female sees pain management Karen Lewis) and is on oxyIR. History of chronic back pain, FM, chronic pain syndrome.   Last seen by me on 10/21/22 for lower back pain. She has known slip at L3-L4 and L4-L5. She has spinal stenosis and bilateral foraminal stenosis L4-L5. Also with large central disc protrusion at L5-S1. Lesion seen at L1 was stable from CT in 2020 and most likely an atypical hemangioma.   History of gastric bypass x 2 and bilateral THAs. May need bilateral TKAs at some point. She uses a scooter- has difficulty walking due to knee pain/left ankle pain. She can only walk short distances.    Weight loss was recommended. BMI would need to be below 50 prior to considering any surgery. She was to continue care with pain management.   She is here for follow up.   She is working with [facility]- seeing Dr. Bufford Lope. She had an EGD on 03/11/23.   She is about the same. She has constant mid back to lower back pain with some numbness/pain in her right lateral thigh that now goes into her right foot. She also notes numbness in left foot as well- has known diabetic neuropathy. She is only walking short distances and uses a scooter.   She is scheduled to see a dietician and is on ozempic. Weight  is at 343 lbs.   She was doing water PT for knees and was unable to tolerate- pain was severe when getting out of pool and for a few days after.    Conservative measures:  Physical therapy: tried combo land/water PT for knees and made her worse Multimodal medical therapy including regular antiinflammatories: oxyIR, zanaflex, lyrica, aleve, voltaren gel, tylenol.  Injections:  Therapeutic right L2-3 LESI x1 (04/29/2022)  Palliative/therapeutic bilateral lumbar facet MBB x8 (08/25/2021)   Past Surgery: no spinal surgery  Exam: No exam done as this was a telephone encounter.     Imaging: none  Assessment and Plan: Ms. Delhomme is a pleasant 52 y.o. female with with constant mid back to lower back pain with some numbness/pain in her right lateral thigh that now goes into her right foot. She also notes numbness in left foot as well- has known diabetic neuropathy.  History of gastric bypass x 2 and bilateral THAs. May need bilateral TKAs soon. She uses a scooter and can only walk short distances.   Known slip at L3-L4 and L4-L5. She has spinal stenosis and moderate/severe left with moderate right foraminal stenosis L4-L5. Also with disc at L5-S1 that likely impinges on left SI nerve.   Treatment options discussed with patient and following plan made:   - Continued weight loss discussed and encouraged. BMI would need to be closer to 35 before she would be candidate for spinal fusion. Weight goal  of 197-225 lbs.  - She will continue to work with [facility].  - She will follow up with me prn for now. Will keep me updated on her weight loss.   Karen Leach PA-C Neurosurgery

## 2023-04-13 DIAGNOSIS — L810 Postinflammatory hyperpigmentation: Secondary | ICD-10-CM | POA: Diagnosis not present

## 2023-04-13 DIAGNOSIS — L708 Other acne: Secondary | ICD-10-CM | POA: Diagnosis not present

## 2023-04-15 ENCOUNTER — Ambulatory Visit (INDEPENDENT_AMBULATORY_CARE_PROVIDER_SITE_OTHER): Payer: Medicare HMO | Admitting: Orthopedic Surgery

## 2023-04-15 ENCOUNTER — Encounter: Payer: Self-pay | Admitting: Orthopedic Surgery

## 2023-04-15 DIAGNOSIS — M5416 Radiculopathy, lumbar region: Secondary | ICD-10-CM

## 2023-04-15 DIAGNOSIS — M47816 Spondylosis without myelopathy or radiculopathy, lumbar region: Secondary | ICD-10-CM

## 2023-04-15 DIAGNOSIS — M4316 Spondylolisthesis, lumbar region: Secondary | ICD-10-CM

## 2023-04-15 DIAGNOSIS — M4726 Other spondylosis with radiculopathy, lumbar region: Secondary | ICD-10-CM

## 2023-04-15 DIAGNOSIS — M48061 Spinal stenosis, lumbar region without neurogenic claudication: Secondary | ICD-10-CM | POA: Diagnosis not present

## 2023-04-20 DIAGNOSIS — M7552 Bursitis of left shoulder: Secondary | ICD-10-CM | POA: Diagnosis not present

## 2023-04-20 DIAGNOSIS — M25512 Pain in left shoulder: Secondary | ICD-10-CM | POA: Diagnosis not present

## 2023-04-20 DIAGNOSIS — M19012 Primary osteoarthritis, left shoulder: Secondary | ICD-10-CM | POA: Diagnosis not present

## 2023-04-20 DIAGNOSIS — G8929 Other chronic pain: Secondary | ICD-10-CM | POA: Diagnosis not present

## 2023-04-20 DIAGNOSIS — M778 Other enthesopathies, not elsewhere classified: Secondary | ICD-10-CM | POA: Diagnosis not present

## 2023-04-20 DIAGNOSIS — M7542 Impingement syndrome of left shoulder: Secondary | ICD-10-CM | POA: Diagnosis not present

## 2023-04-26 DIAGNOSIS — Z9884 Bariatric surgery status: Secondary | ICD-10-CM | POA: Diagnosis not present

## 2023-04-26 DIAGNOSIS — F419 Anxiety disorder, unspecified: Secondary | ICD-10-CM | POA: Diagnosis not present

## 2023-04-26 DIAGNOSIS — Z48815 Encounter for surgical aftercare following surgery on the digestive system: Secondary | ICD-10-CM | POA: Diagnosis not present

## 2023-04-26 DIAGNOSIS — Z6841 Body Mass Index (BMI) 40.0 and over, adult: Secondary | ICD-10-CM | POA: Diagnosis not present

## 2023-04-26 DIAGNOSIS — K912 Postsurgical malabsorption, not elsewhere classified: Secondary | ICD-10-CM | POA: Diagnosis not present

## 2023-04-26 DIAGNOSIS — F331 Major depressive disorder, recurrent, moderate: Secondary | ICD-10-CM | POA: Diagnosis not present

## 2023-04-26 DIAGNOSIS — Z713 Dietary counseling and surveillance: Secondary | ICD-10-CM | POA: Diagnosis not present

## 2023-04-26 DIAGNOSIS — E118 Type 2 diabetes mellitus with unspecified complications: Secondary | ICD-10-CM | POA: Diagnosis not present

## 2023-05-12 DIAGNOSIS — I152 Hypertension secondary to endocrine disorders: Secondary | ICD-10-CM | POA: Diagnosis not present

## 2023-05-12 DIAGNOSIS — E890 Postprocedural hypothyroidism: Secondary | ICD-10-CM | POA: Diagnosis not present

## 2023-05-12 DIAGNOSIS — E1169 Type 2 diabetes mellitus with other specified complication: Secondary | ICD-10-CM | POA: Diagnosis not present

## 2023-05-12 DIAGNOSIS — J301 Allergic rhinitis due to pollen: Secondary | ICD-10-CM | POA: Diagnosis not present

## 2023-05-12 DIAGNOSIS — E1159 Type 2 diabetes mellitus with other circulatory complications: Secondary | ICD-10-CM | POA: Diagnosis not present

## 2023-05-12 DIAGNOSIS — K219 Gastro-esophageal reflux disease without esophagitis: Secondary | ICD-10-CM | POA: Diagnosis not present

## 2023-05-12 DIAGNOSIS — E785 Hyperlipidemia, unspecified: Secondary | ICD-10-CM | POA: Diagnosis not present

## 2023-05-12 DIAGNOSIS — E119 Type 2 diabetes mellitus without complications: Secondary | ICD-10-CM | POA: Diagnosis not present

## 2023-05-12 DIAGNOSIS — J4551 Severe persistent asthma with (acute) exacerbation: Secondary | ICD-10-CM | POA: Diagnosis not present

## 2023-05-12 DIAGNOSIS — R0602 Shortness of breath: Secondary | ICD-10-CM | POA: Diagnosis not present

## 2023-05-13 DIAGNOSIS — F331 Major depressive disorder, recurrent, moderate: Secondary | ICD-10-CM | POA: Diagnosis not present

## 2023-05-27 ENCOUNTER — Inpatient Hospital Stay: Payer: Medicare HMO

## 2023-05-27 ENCOUNTER — Inpatient Hospital Stay: Payer: Medicare HMO | Admitting: Oncology

## 2023-06-01 DIAGNOSIS — Z48815 Encounter for surgical aftercare following surgery on the digestive system: Secondary | ICD-10-CM | POA: Diagnosis not present

## 2023-06-01 DIAGNOSIS — Z9884 Bariatric surgery status: Secondary | ICD-10-CM | POA: Diagnosis not present

## 2023-06-01 DIAGNOSIS — Z713 Dietary counseling and surveillance: Secondary | ICD-10-CM | POA: Diagnosis not present

## 2023-06-01 DIAGNOSIS — Z6841 Body Mass Index (BMI) 40.0 and over, adult: Secondary | ICD-10-CM | POA: Diagnosis not present

## 2023-06-01 DIAGNOSIS — K912 Postsurgical malabsorption, not elsewhere classified: Secondary | ICD-10-CM | POA: Diagnosis not present

## 2023-06-06 NOTE — Progress Notes (Unsigned)
PROVIDER NOTE: Information contained herein reflects review and annotations entered in association with encounter. Interpretation of such information and data should be left to medically-trained personnel. Information provided to patient can be located elsewhere in the medical record under "Patient Instructions". Document created using STT-dictation technology, any transcriptional errors that may result from process are unintentional.    Patient: Karen Lewis  Service Category: E/M  Provider: Oswaldo Done, MD  DOB: September 12, 1971  DOS: 06/08/2023  Referring Provider: Marguarite Arbour, MD  MRN: [id number]  Specialty: Interventional Pain Management  PCP: Karen Arbour, MD  Type: Established Patient  Setting: Ambulatory outpatient    Location: Office  Delivery: Face-to-face     HPI  [name], a 52 y.o. year old female, is here today because of her No primary diagnosis found.. Karen Lewis primary complain today is No chief complaint on file.  Pertinent problems: Karen Lewis has Fibromyalgia; Chronic knee pain (1ry area of Pain) (Bilateral) (R>L); Chronic low back pain (3ry area of Pain) (Bilateral) (R>L) w/o sciatica; Lumbar facet syndrome (Bilateral) (R>L); Secondary osteoarthritis of multiple sites; Grade 1  Anterolisthesis of L3/L4 & L4/L5 (1.4 cm); Chronic hip pain (2ry area of Pain) (Right); S/P THR (total hip replacement) (Left); Lumbar spondylosis; Chronic pain syndrome; Neurogenic pain; Upper extremity pain; Chronic shoulder pain (Left); Osteoarthritis of shoulder (Left); Osteoarthritis of hip (Right); Secondary Osteoarthritis of knee (Bilateral) (R>L); Spondylosis without myelopathy or radiculopathy, lumbosacral region; Panniculitis; Leg swelling; Edema of both legs; History of total hip replacement (Bilateral); S/P THR (total hip replacement) (Right) (11/27/2019); Pain and numbness of left upper extremity; Cervical radiculitis (Left); Bilateral primary osteoarthritis of knee; DDD  (degenerative disc disease), lumbosacral; Osteoarthritis of lumbar spine without myelopathy or radiculopathy; Lumbosacral radiculopathy at S1 (Left); Tricompartment osteoarthritis of knees (Bilateral); Lumbosacral radiculopathy at L2 (Right); Abnormal MRI, lumbar spine (07/01/2022); Chronic thigh pain (Right); Weakness of leg (Right); Lumbar interspinous bursitis (L2-L5); Spinal enthesopathy of lumbar region [facility]); Lumbar facet arthropathy (Multilevel) (Bilateral); Lumbar central spinal stenosis, w/o claudication (L2-3, L4-5, L5-S1); Lumbar foraminal stenosis (Bilateral: L2-3, L4-5) (L>R); Lumbosacral lateral recess stenosis (Bilateral: L4-5, L5-S1); and Trigger point of right shoulder region on their pertinent problem list. Pain Assessment: Severity of   is reported as a  /10. Location:    / . Onset:  . Quality:  . Timing:  . Modifying factor(s):  Marland Kitchen Vitals:  vitals were not taken for this visit.  BMI: Estimated body mass index is 62.89 kg/m as calculated from the following:   Height as of 03/14/23: 5\' 3"  (1.6 m).   Weight as of 03/14/23: 355 lb (161 kg). Last encounter: 02/23/2023. Last procedure: Visit date not found.  Reason for encounter: medication management. ***  RTCB:   Pharmacotherapy Assessment  Analgesic: Oxycodone IR 5 mg 1 tab PO TID (#90/mo) (15 mg/day) MME/day: 22.5 mg/day.   Monitoring: [place] PMP: PDMP reviewed during this encounter.       Pharmacotherapy: No side-effects or adverse reactions reported. Compliance: No problems identified. Effectiveness: Clinically acceptable.  No notes on file  No results found for: "CBDTHCR" No results found for: "D8THCCBX" No results found for: "D9THCCBX"  UDS:  Summary  Date Value Ref Range Status  11/24/2022 Note  Final    Comment:    ==================================================================== [employer] 13 (MW) ==================================================================== Test                             Result  Flag       Units  Drug Present and Declared for Prescription Verification   7-aminoclonazepam              49           EXPECTED   ng/mg creat    7-aminoclonazepam is an expected metabolite of clonazepam. Source of    clonazepam is a scheduled prescription medication.    Noroxycodone                   230          EXPECTED   ng/mg creat    Noroxycodone is an expected metabolite of oxycodone. Sources of    oxycodone include scheduled prescription medications.  Drug Absent but Declared for Prescription Verification   Oxycodone                      Not Detected UNEXPECTED ng/mg creat    Oxycodone is almost always present in patients taking this drug    consistently.  Absence of oxycodone could be due to lapse of time    since the last dose or unusual pharmacokinetics (rapid metabolism).    Butalbital                     Not Detected UNEXPECTED ==================================================================== Test                      Result    Flag   Units      Ref Range   Creatinine              237              mg/dL      >=52 ==================================================================== Declared Medications:  The flagging and interpretation on this report are based on the  following declared medications.  Unexpected results may arise from  inaccuracies in the declared medications.   **Note: The testing scope of this panel includes these medications:   Butalbital (Fioricet)  Clonazepam (Klonopin)  Oxycodone   **Note: The testing scope of this panel does not include the  following reported medications:   Acetaminophen  Acetaminophen (Fioricet)  Albuterol  Amitriptyline (Elavil)  Biotin  Caffeine (Fioricet)  Calcium (Tums)  Diclofenac (Voltaren)  Diphenhydramine (Benadryl)  Duloxetine (Cymbalta)  Epinephrine (EpiPen)  Famotidine (Pepcid)  Fluticasone (Breo)  Galcanezumab (Emgality)  Indeterminate Medication  Levocetirizine (Xyzal)  Levothyroxine  (Synthroid)  Magnesium (Mag-Ox)  Meclizine (Antivert)  Metformin (Glucophage)  Montelukast (Singulair)  Multivitamin  Naloxone (Narcan)  Nitrofurantoin (Macrobid)  Omeprazole (Prilosec)  Probiotic  Rizatriptan (Maxalt)  Rosuvastatin (Crestor)  Semaglutide (Ozempic)  Tizanidine (Zanaflex)  Topiramate (Topamax)  Vilanterol (Breo)  Vitamin B12  Vitamin C  Vitamin D2 ==================================================================== For clinical consultation, please call [phone]. ====================================================================       ROS  Constitutional: Denies any fever or chills Gastrointestinal: No reported hemesis, hematochezia, vomiting, or acute GI distress Musculoskeletal: Denies any acute onset joint swelling, redness, loss of ROM, or weakness Neurological: No reported episodes of acute onset apraxia, aphasia, dysarthria, agnosia, amnesia, paralysis, loss of coordination, or loss of consciousness  Medication Review  Alcohol Prep, Biotin, DULoxetine, EPINEPHrine, Galcanezumab-gnlm, Semaglutide(0.25 or 0.5MG /DOS), Tab-A-Vite, acetaminophen, acidophilus, albuterol, amitriptyline, butalbital-acetaminophen-caffeine, calcium carbonate, clonazePAM, diclofenac sodium, diphenhydrAMINE, ergocalciferol, famotidine, fluticasone furoate-vilanterol, glucose blood, levocetirizine, levothyroxine, lisinopril, magnesium oxide, meclizine, metFORMIN, metoprolol tartrate, montelukast, naloxone, naphazoline-pheniramine, nitrofurantoin, omeprazole, oxyCODONE, pregabalin, rizatriptan, rosuvastatin, tiZANidine, and topiramate  History Review  Allergy: Ms.  Kollmeyer is allergic to bactrim [sulfamethoxazole-trimethoprim], omalizumab, ciprofloxacin, shellfish allergy, and nsaids. Drug: Ms. Woodcock  reports no history of drug use. Alcohol:  reports no history of alcohol use. Tobacco:  reports that she has never smoked. She has never used smokeless tobacco. Social: Ms. Doelling   reports that she has never smoked. She has never used smokeless tobacco. She reports that she does not drink alcohol and does not use drugs. Medical:  has a past medical history of Anemia, Anginal pain (HCC), Anxiety, Arthralgia of hip (07/29/2015), Arthritis, Arthritis, degenerative (07/29/2015), Asthma, Cephalalgia (07/25/2014), Dependence on unknown drug (HCC), Depression, Diabetes mellitus without complication (HCC), Difficult intubation, Dysrhythmia, Eczema, Fibromyalgia, Gastritis, GERD (gastroesophageal reflux disease), Gonalgia (07/29/2015), Gout, H/O cardiovascular disorder (03/10/2015), H/O surgical procedure (12/05/2012), H/O thyroid disease (03/10/2015), Headache, Herpes, History of artificial joint (07/29/2015), History of hiatal hernia, Hypertension, Hypomagnesemia, Hypothyroidism, IDA (iron deficiency anemia) (05/28/2019), LBP (low back pain) (07/29/2015), Neuromuscular disorder (HCC), Obesity, PCOS (polycystic ovarian syndrome), Primary osteoarthritis of both knees (07/29/2015), Sleep apnea, Thyroid nodule (bilateral), and Umbilical hernia. Surgical: Ms. Becher  has a past surgical history that includes Laparoscopic partial gastrectomy; Shoulder arthroscopy (Right); Carpal tunnel release (Bilateral); Diagnostic laparoscopy; Cholecystectomy; Trigger finger release (Right); Thyroidectomy (N/A, 11/12/2015); left trigger finger; Roux-en-Y Gastric Bypass (06/03/2017); Hiatal hernia repair; peniculectomy (N/A, 07/05/2018); Total hip arthroplasty (Right, 11/27/2019); Joint replacement (Bilateral, hip); Appendectomy; Trigger finger release (Right, 04/24/2020); Colonoscopy with propofol (N/A, 02/11/2021); and Esophagogastroduodenoscopy (egd) with propofol (N/A, 02/11/2021). Family: family history includes Alcohol abuse in her father and mother; Anxiety disorder in her father and mother; Brain cancer (age of onset: 65) in her paternal uncle; Breast cancer (age of onset: 26) in her paternal aunt and paternal aunt;  COPD in her father; Depression in her brother, father, and mother; Diabetes in her brother, father, and mother; Hypertension in her brother, father, and mother; Kidney cancer in her mother; Kidney failure in her father; Post-traumatic stress disorder in her father; Sleep apnea in her brother, father, and mother.  Laboratory Chemistry Profile   Renal Lab Results  Component Value Date   BUN 17 08/09/2022   CREATININE 1.04 (H) 08/09/2022   GFRAA >60 04/16/2020   GFRNONAA >60 08/09/2022    Hepatic Lab Results  Component Value Date   AST 25 08/07/2022   ALT 40 08/07/2022   ALBUMIN 3.5 08/07/2022   ALKPHOS 74 08/07/2022   LIPASE 37 08/07/2022    Electrolytes Lab Results  Component Value Date   NA 142 08/09/2022   K 4.8 08/09/2022   CL 116 (H) 08/09/2022   CALCIUM 7.8 (L) 08/09/2022   MG 2.5 (H) 08/07/2022   PHOS 5.5 (H) 05/22/2019    Bone Lab Results  Component Value Date   VD25OH 27.4 (L) 11/24/2015   VD125OH2TOT 41.5 11/24/2015    Inflammation (CRP: Acute Phase) (ESR: Chronic Phase) Lab Results  Component Value Date   CRP 0.7 09/27/2019   ESRSEDRATE 30 (H) 09/27/2019   LATICACIDVEN 1.7 08/07/2022         Note: Above Lab results reviewed.  Recent Imaging Review  MM 3D SCREENING MAMMOGRAM BILATERAL BREAST CLINICAL DATA:  Screening.  EXAM: DIGITAL SCREENING BILATERAL MAMMOGRAM WITH TOMOSYNTHESIS AND CAD  TECHNIQUE: Bilateral screening digital craniocaudal and mediolateral oblique mammograms were obtained. Bilateral screening digital breast tomosynthesis was performed. The images were evaluated with computer-aided detection.  COMPARISON:  Previous exam(s).  ACR Breast Density Category a: The breasts are almost entirely fatty.  FINDINGS: There are no findings suspicious for malignancy.  IMPRESSION:  No mammographic evidence of malignancy. A result letter of this screening mammogram will be mailed directly to the patient.  RECOMMENDATION: Screening  mammogram in one year. (Code:SM-B-01Y)  BI-RADS CATEGORY  1: Negative.  Electronically Signed   By: Beckie Salts M.D.   On: 02/24/2023 18:11 Note: Reviewed        Physical Exam  General appearance: Well nourished, well developed, and well hydrated. In no apparent acute distress Mental status: Alert, oriented x 3 (person, place, & time)       Respiratory: No evidence of acute respiratory distress Eyes: PERLA Vitals: There were no vitals taken for this visit. BMI: Estimated body mass index is 62.89 kg/m as calculated from the following:   Height as of 03/14/23: 5\' 3"  (1.6 m).   Weight as of 03/14/23: 355 lb (161 kg). Ideal: Patient weight not recorded  Assessment   Diagnosis Status  No diagnosis found. Controlled Controlled Controlled   Updated Problems: No problems updated.  Plan of Care  Problem-specific:  No problem-specific Assessment & Plan notes found for this encounter.  Ms. ELSYE MOROSKY has a current medication list which includes the following long-term medication(s): albuterol, diphenhydramine, magnesium oxide, montelukast, oxycodone, oxycodone, oxycodone, rizatriptan, and rizatriptan.  Pharmacotherapy (Medications Ordered): No orders of the defined types were placed in this encounter.  Orders:  No orders of the defined types were placed in this encounter.  Follow-up plan:   No follow-ups on file.      Interventional Therapies  Risk  Complexity Considerations:   Estimated body mass index is 60.59 kg/m as calculated from the following:   Height as of 06/18/21: 5\' 4"  (1.626 m).   Weight as of 06/18/21: 353 lb (160.1 kg).     NOTE: NO RFA until BMI less or equal to 35.  (Gastric bypass Lewis on 06/03/2017) Iodine allergy  Contrast dye allergy  Shellfish allergy    Planned  Pending:      Under consideration:   Diagnostic bilateral genicular NB    Completed:   Therapeutic right L2-3 LESI x1 (04/29/2022) (100/100/60/60)  Therapeutic bilateral IA Monovisc  knee inj. x1 (05/25/2022)  Therapeutic right trapezius TPI/MNB x1 (05/25/2022)  Therapeutic bilateral IA Zilretta knee inj. x4 (03/25/2022) (100/100/80/80)  Therapeutic bilateral IA Hyalgan knee inj. x16 (06/24/2020) (100/100/60/60)  Palliative/therapeutic bilateral lumbar facet MBB x8 (08/25/2021) (100/100/60/60)  Therapeutic right IA hip injection x3 (07/07/2017) (100/50/25/>50)  Therapeutic left IA shoulder (glenohumeral joint) injection x1 (05/31/2017) (50/0/100/100)    Completed by other providers:   Therapeutic bilateral THR (total hip replacements) (Dr. Rosita Kea) (Right: 11/27/2019) (Left: 07/29/2015)    Therapeutic  Palliative (PRN) options:   Palliative bilateral knee injections  Palliative bilateral lumbar facet MBBs    Pharmacotherapy  Nonopioid transferred 06/24/2020: Lyrica         Recent Visits No visits were found meeting these conditions. Showing recent visits within past 90 days and meeting all other requirements Future Appointments Date Type Provider Dept  06/08/23 Appointment Delano Metz, MD [facility]  Showing future appointments within next 90 days and meeting all other requirements  I discussed the assessment and treatment plan with the patient. The patient was provided an opportunity to ask questions and all were answered. The patient agreed with the plan and demonstrated an understanding of the instructions.  Patient advised to call back or seek an in-person evaluation if the symptoms or condition worsens.  Duration of encounter: *** minutes.  Total time on encounter, as per AMA guidelines included both  the face-to-face and non-face-to-face time personally spent by the physician and/or other qualified health care professional(s) on the day of the encounter (includes time in activities that require the physician or other qualified health care professional and does not include time in activities normally performed by clinical staff). Physician's  time may include the following activities when performed: Preparing to see the patient (e.g., pre-charting review of records, searching for previously ordered imaging, lab work, and nerve conduction tests) Review of prior analgesic pharmacotherapies. Reviewing PMP Interpreting ordered tests (e.g., lab work, imaging, nerve conduction tests) Performing post-procedure evaluations, including interpretation of diagnostic procedures Obtaining and/or reviewing separately obtained history Performing a medically appropriate examination and/or evaluation Counseling and educating the patient/family/caregiver Ordering medications, tests, or procedures Referring and communicating with other health care professionals (when not separately reported) Documenting clinical information in the electronic or other health record Independently interpreting results (not separately reported) and communicating results to the patient/ family/caregiver Care coordination (not separately reported)  Note by: Karen Done, MD Date: 06/08/2023; Time: 7:52 PM

## 2023-06-07 DIAGNOSIS — G43719 Chronic migraine without aura, intractable, without status migrainosus: Secondary | ICD-10-CM | POA: Diagnosis not present

## 2023-06-08 ENCOUNTER — Ambulatory Visit: Payer: Medicare HMO | Attending: Pain Medicine | Admitting: Pain Medicine

## 2023-06-08 ENCOUNTER — Encounter: Payer: Self-pay | Admitting: Pain Medicine

## 2023-06-08 VITALS — BP 118/73 | HR 87 | Temp 97.5°F | Ht 63.0 in | Wt 341.0 lb

## 2023-06-08 DIAGNOSIS — M4310 Spondylolisthesis, site unspecified: Secondary | ICD-10-CM | POA: Diagnosis present

## 2023-06-08 DIAGNOSIS — Z79899 Other long term (current) drug therapy: Secondary | ICD-10-CM

## 2023-06-08 DIAGNOSIS — M47817 Spondylosis without myelopathy or radiculopathy, lumbosacral region: Secondary | ICD-10-CM | POA: Diagnosis present

## 2023-06-08 DIAGNOSIS — M47816 Spondylosis without myelopathy or radiculopathy, lumbar region: Secondary | ICD-10-CM

## 2023-06-08 DIAGNOSIS — M25562 Pain in left knee: Secondary | ICD-10-CM | POA: Diagnosis not present

## 2023-06-08 DIAGNOSIS — G894 Chronic pain syndrome: Secondary | ICD-10-CM

## 2023-06-08 DIAGNOSIS — Z79891 Long term (current) use of opiate analgesic: Secondary | ICD-10-CM

## 2023-06-08 DIAGNOSIS — Z6841 Body Mass Index (BMI) 40.0 and over, adult: Secondary | ICD-10-CM | POA: Insufficient documentation

## 2023-06-08 DIAGNOSIS — M545 Low back pain, unspecified: Secondary | ICD-10-CM | POA: Diagnosis not present

## 2023-06-08 DIAGNOSIS — M4316 Spondylolisthesis, lumbar region: Secondary | ICD-10-CM | POA: Diagnosis not present

## 2023-06-08 DIAGNOSIS — M51379 Other intervertebral disc degeneration, lumbosacral region without mention of lumbar back pain or lower extremity pain: Secondary | ICD-10-CM

## 2023-06-08 DIAGNOSIS — M25561 Pain in right knee: Secondary | ICD-10-CM | POA: Insufficient documentation

## 2023-06-08 DIAGNOSIS — G8929 Other chronic pain: Secondary | ICD-10-CM | POA: Diagnosis not present

## 2023-06-08 DIAGNOSIS — M25551 Pain in right hip: Secondary | ICD-10-CM | POA: Insufficient documentation

## 2023-06-08 DIAGNOSIS — M5137 Other intervertebral disc degeneration, lumbosacral region: Secondary | ICD-10-CM | POA: Insufficient documentation

## 2023-06-08 DIAGNOSIS — M153 Secondary multiple arthritis: Secondary | ICD-10-CM

## 2023-06-08 DIAGNOSIS — E66813 Obesity, class 3: Secondary | ICD-10-CM

## 2023-06-08 MED ORDER — OXYCODONE HCL 5 MG PO TABS
5.0000 mg | ORAL_TABLET | Freq: Three times a day (TID) | ORAL | 0 refills | Status: DC | PRN
Start: 2023-07-19 — End: 2023-09-11

## 2023-06-08 MED ORDER — NALOXONE HCL 4 MG/0.1ML NA LIQD
1.0000 | NASAL | 0 refills | Status: DC | PRN
Start: 2023-06-08 — End: 2023-09-11

## 2023-06-08 MED ORDER — OXYCODONE HCL 5 MG PO TABS
5.0000 mg | ORAL_TABLET | Freq: Three times a day (TID) | ORAL | 0 refills | Status: DC | PRN
Start: 2023-08-18 — End: 2023-09-25

## 2023-06-08 MED ORDER — OXYCODONE HCL 5 MG PO TABS
5.0000 mg | ORAL_TABLET | Freq: Three times a day (TID) | ORAL | 0 refills | Status: DC | PRN
Start: 2023-06-19 — End: 2023-09-11

## 2023-06-08 NOTE — Patient Instructions (Addendum)
______________________________________________________________________    Procedure instructions  Stop blood-thinners  Do not eat or drink fluids (other than water) for 6 hours before your procedure  No water for 2 hours before your procedure  Take your blood pressure medicine with a sip of water  Arrive 30 minutes before your appointment  If sedation is planned, bring suitable driver. Pennie Banter, Benedetto Goad, & public transportation are NOT APPROVED)  Carefully read the "Preparing for your procedure" detailed instructions  If you have questions call us at [phone]  ______________________________________________________________________      ______________________________________________________________________    Preparing for your procedure  Appointments: If you think you may not be able to keep your appointment, call 24-48 hours in advance to cancel. We need time to make it available to others.  During your procedure appointment there will be: No Prescription Refills. No disability issues to discussed. No medication changes or discussions.  Instructions: Food intake: Avoid eating anything solid for at least 8 hours prior to your procedure. Clear liquid intake: You may take clear liquids such as water up to 2 hours prior to your procedure. (No carbonated drinks. No soda.) Transportation: Unless otherwise stated by your physician, bring a driver. (Driver cannot be a Market researcher, Pharmacist, community, or any other form of public transportation.) Morning Medicines: Except for blood thinners, take all of your other morning medications with a sip of water. Make sure to take your heart and blood pressure medicines. If your blood pressure's lower number is above 100, the case will be rescheduled. Blood thinners: Make sure to stop your blood thinners as instructed.  If you take a blood thinner, but were not instructed to stop it, call our office [phone] and ask to talk to a nurse. Not stopping a blood  thinner prior to certain procedures could lead to serious complications. Diabetics on insulin: Notify the staff so that you can be scheduled 1st case in the morning. If your diabetes requires high dose insulin, take only  of your normal insulin dose the morning of the procedure and notify the staff that you have done so. Preventing infections: Shower with an antibacterial soap the morning of your procedure.  Build-up your immune system: Take 1000 mg of Vitamin C with every meal (3 times a day) the day prior to your procedure. Antibiotics: Inform the nursing staff if you are taking any antibiotics or if you have any conditions that may require antibiotics prior to procedures. (Example: recent joint implants)   Pregnancy: If you are pregnant make sure to notify the nursing staff. Not doing so may result in injury to the fetus, including death.  Sickness: If you have a cold, fever, or any active infections, call and cancel or reschedule your procedure. Receiving steroids while having an infection may result in complications. Arrival: You must be in the facility at least 30 minutes prior to your scheduled procedure. Tardiness: Your scheduled time is also the cutoff time. If you do not arrive at least 15 minutes prior to your procedure, you will be rescheduled.  Children: Do not bring any children with you. Make arrangements to keep them home. Dress appropriately: There is always a possibility that your clothing may get soiled. Avoid long dresses. Valuables: Do not bring any jewelry or valuables.  Reasons to call and reschedule or cancel your procedure: (Following these recommendations will minimize the risk of a serious complication.) Surgeries: Avoid having procedures within 2 weeks of any surgery. (Avoid for 2 weeks before or after any surgery). Flu Shots:  Avoid having procedures within 2 weeks of a flu shots or . (Avoid for 2 weeks before or after immunizations). Barium: Avoid having a procedure  within 7-10 days after having had a radiological study involving the use of radiological contrast. (Myelograms, Barium swallow or enema study). Heart attacks: Avoid any elective procedures or surgeries for the initial 6 months after a "Myocardial Infarction" (Heart Attack). Blood thinners: It is imperative that you stop these medications before procedures. Let us know if you if you take any blood thinner.  Infection: Avoid procedures during or within two weeks of an infection (including chest colds or gastrointestinal problems). Symptoms associated with infections include: Localized redness, fever, chills, night sweats or profuse sweating, burning sensation when voiding, cough, congestion, stuffiness, runny nose, sore throat, diarrhea, nausea, vomiting, cold or Flu symptoms, recent or current infections. It is specially important if the infection is over the area that we intend to treat. Heart and lung problems: Symptoms that may suggest an active cardiopulmonary problem include: cough, chest pain, breathing difficulties or shortness of breath, dizziness, ankle swelling, uncontrolled high or unusually low blood pressure, and/or palpitations. If you are experiencing any of these symptoms, cancel your procedure and contact your primary care physician for an evaluation.  Remember:  Regular Business hours are:  Monday to Thursday 8:00 AM to 4:00 PM  Provider's Schedule: Delano Metz, MD:  Procedure days: Tuesday and Thursday 7:30 AM to 4:00 PM  Edward Jolly, MD:  Procedure days: Monday and Wednesday 7:30 AM to 4:00 PM Last  Updated: 05/03/2023 ______________________________________________________________________      ______________________________________________________________________    General Risks and Possible Complications  Patient Responsibilities: It is important that you read this as it is part of your informed consent. It is our duty to inform you of the risks and possible  complications associated with treatments offered to you. It is your responsibility as a patient to read this and to ask questions about anything that is not clear or that you believe was not covered in this document.  Patient's Rights: You have the right to refuse treatment. You also have the right to change your mind, even after initially having agreed to have the treatment done. However, under this last option, if you wait until the last second to change your mind, you may be charged for the materials used up to that point.  Introduction: Medicine is not an Visual merchandiser. Everything in Medicine, including the lack of treatment(s), carries the potential for danger, harm, or loss (which is by definition: Risk). In Medicine, a complication is a secondary problem, condition, or disease that can aggravate an already existing one. All treatments carry the risk of possible complications. The fact that a side effects or complications occurs, does not imply that the treatment was conducted incorrectly. It must be clearly understood that these can happen even when everything is done following the highest safety standards.  No treatment: You can choose not to proceed with the proposed treatment alternative. The "PRO(s)" would include: avoiding the risk of complications associated with the therapy. The "CON(s)" would include: not getting any of the treatment benefits. These benefits fall under one of three categories: diagnostic; therapeutic; and/or palliative. Diagnostic benefits include: getting information which can ultimately lead to improvement of the disease or symptom(s). Therapeutic benefits are those associated with the successful treatment of the disease. Finally, palliative benefits are those related to the decrease of the primary symptoms, without necessarily curing the condition (example: decreasing the pain from a flare-up  of a chronic condition, such as incurable terminal cancer).  General Risks and  Complications: These are associated to most interventional treatments. They can occur alone, or in combination. They fall under one of the following six (6) categories: no benefit or worsening of symptoms; bleeding; infection; nerve damage; allergic reactions; and/or death. No benefits or worsening of symptoms: In Medicine there are no guarantees, only probabilities. No healthcare provider can ever guarantee that a medical treatment will work, they can only state the probability that it may. Furthermore, there is always the possibility that the condition may worsen, either directly, or indirectly, as a consequence of the treatment. Bleeding: This is more common if the patient is taking a blood thinner, either prescription or over the counter (example: Goody Powders, Fish oil, Aspirin, Garlic, etc.), or if suffering a condition associated with impaired coagulation (example: Hemophilia, cirrhosis of the liver, low platelet counts, etc.). However, even if you do not have one on these, it can still happen. If you have any of these conditions, or take one of these drugs, make sure to notify your treating physician. Infection: This is more common in patients with a compromised immune system, either due to disease (example: diabetes, cancer, human immunodeficiency virus [HIV], etc.), or due to medications or treatments (example: therapies used to treat cancer and rheumatological diseases). However, even if you do not have one on these, it can still happen. If you have any of these conditions, or take one of these drugs, make sure to notify your treating physician. Nerve Damage: This is more common when the treatment is an invasive one, but it can also happen with the use of medications, such as those used in the treatment of cancer. The damage can occur to small secondary nerves, or to large primary ones, such as those in the spinal cord and brain. This damage may be temporary or permanent and it may lead to  impairments that can range from temporary numbness to permanent paralysis and/or brain death. Allergic Reactions: Any time a substance or material comes in contact with our body, there is the possibility of an allergic reaction. These can range from a mild skin rash (contact dermatitis) to a severe systemic reaction (anaphylactic reaction), which can result in death. Death: In general, any medical intervention can result in death, most of the time due to an unforeseen complication. ______________________________________________________________________      ______________________________________________________________________    Opioid Pain Medication Update  To: All patients taking opioid pain medications. (I.e.: hydrocodone, hydromorphone, oxycodone, oxymorphone, morphine, codeine, methadone, tapentadol, tramadol, buprenorphine, fentanyl, etc.)  Re: Updated review of side effects and adverse reactions of opioid analgesics, as well as new information about long term effects of this class of medications.  Direct risks of long-term opioid therapy are not limited to opioid addiction and overdose. Potential medical risks include serious fractures, breathing problems during sleep, hyperalgesia, immunosuppression, chronic constipation, bowel obstruction, myocardial infarction, and tooth decay secondary to xerostomia.  Unpredictable adverse effects that can occur even if you take your medication correctly: Cognitive impairment, respiratory depression, and death. Most people think that if they take their medication "correctly", and "as instructed", that they will be safe. Nothing could be farther from the truth. In reality, a significant amount of recorded deaths associated with the use of opioids has occurred in individuals that had taken the medication for a long time, and were taking their medication correctly. The following are examples of how this can happen: Patient taking his/her medication for a  long time, as instructed, without any side effects, is given a certain antibiotic or another unrelated medication, which in turn triggers a "Drug-to-drug interaction" leading to disorientation, cognitive impairment, impaired reflexes, respiratory depression or an untoward event leading to serious bodily harm or injury, including death.  Patient taking his/her medication for a long time, as instructed, without any side effects, develops an acute impairment of liver and/or kidney function. This will lead to a rapid inability of the body to breakdown and eliminate their pain medication, which will result in effects similar to an "overdose", but with the same medicine and dose that they had always taken. This again may lead to disorientation, cognitive impairment, impaired reflexes, respiratory depression or an untoward event leading to serious bodily harm or injury, including death.  A similar problem will occur with patients as they grow older and their liver and kidney function begins to decrease as part of the aging process.  Background information: Historically, the original case for using long-term opioid therapy to treat chronic noncancer pain was based on safety assumptions that subsequent experience has called into question. In 1996, the American Pain Society and the American Academy of Pain Medicine issued a consensus statement supporting long-term opioid therapy. This statement acknowledged the dangers of opioid prescribing but concluded that the risk for addiction was low; respiratory depression induced by opioids was short-lived, occurred mainly in opioid-naive patients, and was antagonized by pain; tolerance was not a common problem; and efforts to control diversion should not constrain opioid prescribing. This has now proven to be wrong. Experience regarding the risks for opioid addiction, misuse, and overdose in community practice has failed to support these assumptions.  According to the Centers  for Disease Control and Prevention, fatal overdoses involving opioid analgesics have increased sharply over the past decade. Currently, more than 96,700 people die from drug overdoses every year. Opioids are a factor in 7 out of every 10 overdose deaths. Deaths from drug overdose have surpassed motor vehicle accidents as the leading cause of death for individuals between the ages of 86 and 63.  Clinical data suggest that neuroendocrine dysfunction may be very common in both men and women, potentially causing hypogonadism, erectile dysfunction, infertility, decreased libido, osteoporosis, and depression. Recent studies linked higher opioid dose to increased opioid-related mortality. Controlled observational studies reported that long-term opioid therapy may be associated with increased risk for cardiovascular events. Subsequent meta-analysis concluded that the safety of long-term opioid therapy in elderly patients has not been proven.   Side Effects and adverse reactions: Common side effects: Drowsiness (sedation). Dizziness. Nausea and vomiting. Constipation. Physical dependence -- Dependence often manifests with withdrawal symptoms when opioids are discontinued or decreased. Tolerance -- As you take repeated doses of opioids, you require increased medication to experience the same effect of pain relief. Respiratory depression -- This can occur in healthy people, especially with higher doses. However, people with COPD, asthma or other lung conditions may be even more susceptible to fatal respiratory impairment.  Uncommon side effects: An increased sensitivity to feeling pain and extreme response to pain (hyperalgesia). Chronic use of opioids can lead to this. Delayed gastric emptying (the process by which the contents of your stomach are moved into your small intestine). Muscle rigidity. Immune system and hormonal dysfunction. Quick, involuntary muscle jerks (myoclonus). Arrhythmia. Itchy skin  (pruritus). Dry mouth (xerostomia).  Long-term side effects: Chronic constipation. Sleep-disordered breathing (SDB). Increased risk of bone fractures. Hypothalamic-pituitary-adrenal dysregulation. Increased risk of overdose.  RISKS: Respiratory depression and death: Opioids  increase the risk of respiratory depression and death.  Drug-to-drug interactions: Opioids are relatively contraindicated in combination with benzodiazepines, sleep inducers, and other central nervous system depressants. Other classes of medications (i.e.: certain antibiotics and even over-the-counter medications) may also trigger or induce respiratory depression in some patients.  Medical conditions: Patients with pre-existing respiratory problems are at higher risk of respiratory failure and/or depression when in combination with opioid analgesics. Opioids are relatively contraindicated in some medical conditions such as central sleep apnea.   Fractures and Falls:  Opioids increase the risk and incidence of falls. This is of particular importance in elderly patients.  Endocrine System:  Long-term administration is associated with endocrine abnormalities (endocrinopathies). (Also known as Opioid-induced Endocrinopathy) Influences on both the hypothalamic-pituitary-adrenal axis?and the hypothalamic-pituitary-gonadal axis have been demonstrated with consequent hypogonadism and adrenal insufficiency in both sexes. Hypogonadism and decreased levels of dehydroepiandrosterone sulfate have been reported in men and women. Endocrine effects include: Amenorrhoea in women (abnormal absence of menstruation) Reduced libido in both sexes Decreased sexual function Erectile dysfunction in men Hypogonadisms (decreased testicular function with shrinkage of testicles) Infertility Depression and fatigue Loss of muscle mass Anxiety Depression Immune suppression Hyperalgesia Weight gain Anemia Osteoporosis Patients (particularly  women of childbearing age) should avoid opioids. There is insufficient evidence to recommend routine monitoring of asymptomatic patients taking opioids in the long-term for hormonal deficiencies.  Immune System: Human studies have demonstrated that opioids have an immunomodulating effect. These effects are mediated via opioid receptors both on immune effector cells and in the central nervous system. Opioids have been demonstrated to have adverse effects on antimicrobial response and anti-tumour surveillance. Buprenorphine has been demonstrated to have no impact on immune function.  Opioid Induced Hyperalgesia: Human studies have demonstrated that prolonged use of opioids can lead to a state of abnormal pain sensitivity, sometimes called opioid induced hyperalgesia (OIH). Opioid induced hyperalgesia is not usually seen in the absence of tolerance to opioid analgesia. Clinically, hyperalgesia may be diagnosed if the patient on long-term opioid therapy presents with increased pain. This might be qualitatively and anatomically distinct from pain related to disease progression or to breakthrough pain resulting from development of opioid tolerance. Pain associated with hyperalgesia tends to be more diffuse than the pre-existing pain and less defined in quality. Management of opioid induced hyperalgesia requires opioid dose reduction.  Cancer: Chronic opioid therapy has been associated with an increased risk of cancer among noncancer patients with chronic pain. This association was more evident in chronic strong opioid users. Chronic opioid consumption causes significant pathological changes in the small intestine and colon. Epidemiological studies have found that there is a link between opium dependence and initiation of gastrointestinal cancers. Cancer is the second leading cause of death after cardiovascular disease. Chronic use of opioids can cause multiple conditions such as GERD, immunosuppression and  renal damage as well as carcinogenic effects, which are associated with the incidence of cancers.   Mortality: Long-term opioid use has been associated with increased mortality among patients with chronic non-cancer pain (CNCP).  Prescription of long-acting opioids for chronic noncancer pain was associated with a significantly increased risk of all-cause mortality, including deaths from causes other than overdose.  Reference: Von Korff M, Kolodny A, Deyo RA, Chou R. Long-term opioid therapy reconsidered. Ann Intern Med. 2011 Sep 6;155(5):325-8. doi: 10.7326/[id number]. PMID: 36644034; PMCID: [id number]. Randon Goldsmith, Hayward RA, Dunn KM, [place] KP. Risk of adverse events in patients prescribed long-term opioids: A cohort study in the [place] [facility]  [employer]. Eur J Pain. 2019 May;23(5):908-922. doi: 10.1002/ejp.1357. Epub 2019 Jan 31. PMID: 16109604. Colameco S, Coren JS, Ciervo CA. Continuous opioid treatment for chronic noncancer pain: a time for moderation in prescribing. Postgrad Med. 2009 Jul;121(4):61-6. doi: 10.3810/pgm.2009.07.2032. PMID: 54098119. William Hamburger RN, [place], Blazina I, Cristopher [place], Bougatsos C, Deyo RA. The effectiveness and risks of long-term opioid therapy for chronic pain: a systematic review for a [employer] to [employer]. Ann Intern Med. 2015 Feb 17;162(4):276-86. doi: 10.7326/M14-2559. PMID: 14782956. Caryl Bis [facility], [place] DM. NCHS Data Brief No. 22. [place]: Centers for Disease Control and Prevention; 2009. Sep, Increase in Fatal Poisonings Involving Opioid Analgesics in the [place], 1999-2006. Song IA, Choi HR, Oh TK. Long-term opioid use and mortality in patients with chronic non-cancer pain: Ten-year follow-up study in [place] from 2010 through 2019. EClinicalMedicine. 2022 Jul 18;51:101558. doi: 10.1016/j.eclinm.2022.213086. PMID: 57846962; PMCID:  [id number]. Huser, W., Schubert, T., Vogelmann, T. et al. All-cause mortality in patients with long-term opioid therapy compared with non-opioid analgesics for chronic non-cancer pain: a database study. BMC Med 18, 162 (2020). http://lester.info/ Rashidian H, Karie Kirks, Malekzadeh R, Haghdoost AA. An Ecological Study of the Association between Opiate Use and Incidence of Cancers. Addict Health. 2016 Fall;8(4):252-260. PMID: 01027253; PMCID: [id number].  Our Goal: Our goal is to control your pain with means other than the use of opioid pain medications.  Our Recommendation: Talk to your physician about coming off of these medications. We can assist you with the tapering down and stopping these medicines. Based on the new information, even if you cannot completely stop the medication, a decrease in the dose may be associated with a lesser risk. Ask for other means of controlling the pain. Decrease or eliminate those factors that significantly contribute to your pain such as smoking, obesity, and a diet heavily tilted towards "inflammatory" nutrients.  Last Updated: 03/21/2023   ______________________________________________________________________       ______________________________________________________________________    National Pain Medication Shortage  The [place] is experiencing worsening drug shortages. These have had a negative widespread effect on patient care and treatment. Not expected to improve any time soon. Predicted to last past 2029.   Drug shortage list (generic names) Oxycodone IR Oxycodone/APAP Oxymorphone IR Hydromorphone Hydrocodone/APAP Morphine  Where is the problem?  Manufacturing and supply level.  Will this shortage affect you?  Only if you take any of the above pain medications.  How? You may be unable to fill your prescription.  Your pharmacist may offer a "partial fill" of your prescription. (Warning: Do not  accept partial fills.) Prescriptions partially filled cannot be transferred to another pharmacy. Read our Medication Rules and Regulation. Depending on how much medicine you are dependent on, you may experience withdrawals when unable to get the medication.  Recommendations: Consider ending your dependence on opioid pain medications. Ask your pain specialist to assist you with the process. Consider switching to a medication currently not in shortage, such as Buprenorphine. Talk to your pain specialist about this option. Consider decreasing your pain medication requirements by managing tolerance thru "Drug Holidays". This may help minimize withdrawals, should you run out of medicine. Control your pain thru the use of non-pharmacological interventional therapies.   Your prescriber: Prescribers cannot be blamed for shortages. Medication manufacturing and supply issues cannot be fixed by the prescriber.   NOTE: The prescriber is not responsible for supplying the medication, or solving supply issues. Work with Engineer, production  to solve it. The patient is responsible for the decision to take or continue taking the medication and for identifying and securing a legal supply source. By law, supplying the medication is the job and responsibility of the pharmacy. The prescriber is responsible for the evaluation, monitoring, and prescribing of these medications.   Prescribers will NOT: Re-issue prescriptions that have been partially filled. Re-issue prescriptions already sent to a pharmacy.  Re-send prescriptions to a different pharmacy because yours did not have your medication. Ask pharmacist to order more medicine or transfer the prescription to another pharmacy. (Read below.)  New 2023 regulation: "May 14, 2022 Revised Regulation Allows DEA-Registered Pharmacies to Transfer Electronic Prescriptions at a Patient's Request DEA Headquarters Division - Public Information Office Patients now have the  ability to request their electronic prescription be transferred to another pharmacy without having to go back to their practitioner to initiate the request. This revised regulation went into effect on Monday, May 10, 2022.     At a patient's request, a DEA-registered retail pharmacy can now transfer an electronic prescription for a controlled substance (schedules II-V) to another DEA-registered retail pharmacy. Prior to this change, patients would have to go through their practitioner to cancel their prescription and have it re-issued to a different pharmacy. The process was taxing and time consuming for both patients and practitioners.    The Drug Enforcement Administration [facility]) published its intent to revise the process for transferring electronic prescriptions on August 01, 2020.  The final rule was published in the federal register on April 08, 2022 and went into effect 30 days later.  Under the final rule, a prescription can only be transferred once between pharmacies, and only if allowed under existing state or other applicable law. The prescription must remain in its electronic form; may not be altered in any way; and the transfer must be communicated directly between two licensed pharmacists. It's important to note, any authorized refills transfer with the original prescription, which means the entire prescription will be filled at the same pharmacy".  Reference: [email or web address].is [facility] website announcement)  [email or web address] Financial planner of [employer])   [employer] / Vol. 88, No. 143 / Thursday, April 08, 2022 / Rules and Regulations DEPARTMENT OF JUSTICE  Drug Enforcement Administration  21 CFR Part 1306  [Docket No. [id number]]  RIN [id number] Transfer of Electronic Prescriptions for Schedules II-V Controlled Substances  Between Pharmacies for Initial Filling  ______________________________________________________________________       ______________________________________________________________________    Transfer of Pain Medication between Pharmacies  Re: 2023 DEA Clarification on existing regulation  Published on DEA Website: May 14, 2022  Title: Revised Regulation Allows DEA-Registered Pharmacies to Electrical engineer Prescriptions at a Patient's Request DEA Headquarters Division - [employer]  "Patients now have the ability to request their electronic prescription be transferred to another pharmacy without having to go back to their practitioner to initiate the request. This revised regulation went into effect on Monday, May 10, 2022.     At a patient's request, a DEA-registered retail pharmacy can now transfer an electronic prescription for a controlled substance (schedules II-V) to another DEA-registered retail pharmacy. Prior to this change, patients would have to go through their practitioner to cancel their prescription and have it re-issued to a different pharmacy. The process was taxing and time consuming for both patients and practitioners.    The Drug Enforcement Administration [facility]) published its intent to revise the process for transferring electronic prescriptions on August 01, 2020.  The final rule was published in the federal register on April 08, 2022 and went into effect 30 days later.  Under the final rule, a prescription can only be transferred once between pharmacies, and only if allowed under existing state or other applicable law. The prescription must remain in its electronic form; may not be altered in any way; and the transfer must be communicated directly between two licensed pharmacists. It's important to note, any authorized refills transfer with the original prescription, which means the entire prescription will be filled at the same pharmacy."     REFERENCES: 1. DEA website announcement [email or web address]  2. Department of Justice website  [email or web address]  3. DEPARTMENT OF JUSTICE Drug Enforcement Administration 21 CFR Part 1306 [Docket No. [id number]] RIN [id number]-[id number] "Transfer of Electronic Prescriptions for Schedules II-V Controlled Substances Between Pharmacies for Initial Filling"  ______________________________________________________________________       ______________________________________________________________________    Medication Rules  Purpose: To inform patients, and their family members, of our medication rules and regulations.  Applies to: All patients receiving prescriptions from our practice (written or electronic).  Pharmacy of record: This is the pharmacy where your electronic prescriptions will be sent. Make sure we have the correct one.  Electronic prescriptions: In compliance with the [facility] Strengthen Opioid Misuse Prevention (STOP) Act of 2017 (Session Conni Elliot [phone]), effective September 13, 2018, all controlled substances must be electronically prescribed. Written prescriptions, faxing, or calling prescriptions to a pharmacy will no longer be done.  Prescription refills: These will be provided only during in-person appointments. No medications will be renewed without a "face-to-face" evaluation with your provider. Applies to all prescriptions.  NOTE: The following applies primarily to controlled substances (Opioid* Pain Medications).   Type of encounter (visit): For patients receiving controlled substances, face-to-face visits are required. (Not an option and not up to the patient.)  Patient's responsibilities: Pain Pills: Bring all pain pills to every appointment (except for procedure appointments). Pill Bottles: Bring pills in original  pharmacy bottle. Bring bottle, even if empty. Always bring the bottle of the most recent fill.  Medication refills: You are responsible for knowing and keeping track of what medications you are taking and when is it that you will need a refill. The day before your appointment: write a list of all prescriptions that need to be refilled. The day of the appointment: give the list to the admitting nurse. Prescriptions will be written only during appointments. No prescriptions will be written on procedure days. If you forget a medication: it will not be "Called in", "Faxed", or "electronically sent". You will need to get another appointment to get these prescribed. No early refills. Do not call asking to have your prescription filled early. Partial  or short prescriptions: Occasionally your pharmacy may not have enough pills to fill your prescription.  NEVER ACCEPT a partial fill or a prescription that is short of the total amount of pills that you were prescribed.  With controlled substances the law allows 72 hours for the pharmacy to complete the prescription.  If the prescription is not completed within 72 hours, the pharmacist will require a new prescription to be written. This means that you will be short on your medicine and we WILL NOT send another prescription to complete your original prescription.  Instead, request the pharmacy to send a carrier to a nearby branch to get enough medication to provide you with your full prescription. Prescription Accuracy: You are responsible for carefully inspecting your prescriptions before leaving our office. Have  the discharge nurse carefully go over each prescription with you, before taking them home. Make sure that your name is accurately spelled, that your address is correct. Check the name and dose of your medication to make sure it is accurate. Check the number of pills, and the written instructions to make sure they are clear and accurate. Make sure that you are  given enough medication to last until your next medication refill appointment. Taking Medication: Take medication as prescribed. When it comes to controlled substances, taking less pills or less frequently than prescribed is permitted and encouraged. Never take more pills than instructed. Never take the medication more frequently than prescribed.  Inform other Doctors: Always inform, all of your healthcare providers, of all the medications you take. Pain Medication from other Providers: You are not allowed to accept any additional pain medication from any other Doctor or Healthcare provider. There are two exceptions to this rule. (see below) In the event that you require additional pain medication, you are responsible for notifying us, as stated below. Cough Medicine: Often these contain an opioid, such as codeine or hydrocodone. Never accept or take cough medicine containing these opioids if you are already taking an opioid* medication. The combination may cause respiratory failure and death. Medication Agreement: You are responsible for carefully reading and following our Medication Agreement. This must be signed before receiving any prescriptions from our practice. Safely store a copy of your signed Agreement. Violations to the Agreement will result in no further prescriptions. (Additional copies of our Medication Agreement are available upon request.) Laws, Rules, & Regulations: All patients are expected to follow all [address] and [employer], Rules, [employer]. Ignorance of the Laws does not constitute a valid excuse.  Illegal drugs and Controlled Substances: The use of illegal substances (including, but not limited to marijuana and its derivatives) and/or the illegal use of any controlled substances is strictly prohibited. Violation of this rule may result in the immediate and permanent discontinuation of any and all prescriptions being written by our practice. The use of any illegal  substances is prohibited. Adopted CDC guidelines & recommendations: Target dosing levels will be at or below 60 MME/day. Use of benzodiazepines** is not recommended.  Exceptions: There are only two exceptions to the rule of not receiving pain medications from other Healthcare Providers. Exception #1 (Emergencies): In the event of an emergency (i.e.: accident requiring emergency care), you are allowed to receive additional pain medication. However, you are responsible for: As soon as you are able, call our office [phone], at any time of the day or night, and leave a message stating your name, the date and nature of the emergency, and the name and dose of the medication prescribed. In the event that your call is answered by a member of our staff, make sure to document and save the date, time, and the name of the person that took your information.  Exception #2 (Planned Surgery): In the event that you are scheduled by another doctor or dentist to have any type of surgery or procedure, you are allowed (for a period no longer than 30 days), to receive additional pain medication, for the acute post-op pain. However, in this case, you are responsible for picking up a copy of our "Post-op Pain Management for Surgeons" handout, and giving it to your surgeon or dentist. This document is available at our office, and does not require an appointment to obtain it. Simply go to our office during business hours (Monday-Thursday  from 8:00 AM to 4:00 PM) (Friday 8:00 AM to 12:00 Noon) or if you have a scheduled appointment with [place], prior to your surgery, and ask for it by name. In addition, you are responsible for: calling our office [phone], at any time of the day or night, and leaving a message stating your name, name of your surgeon, type of surgery, and date of procedure or surgery. Failure to comply with your responsibilities may result in termination of therapy involving the controlled substances. Medication  Agreement Violation. Following the above rules, including your responsibilities will help you in avoiding a Medication Agreement Violation ("Breaking your Pain Medication Contract").  Consequences:  Not following the above rules may result in permanent discontinuation of medication prescription therapy.  *Opioid medications include: morphine, codeine, oxycodone, oxymorphone, hydrocodone, hydromorphone, meperidine, tramadol, tapentadol, buprenorphine, fentanyl, methadone. **Benzodiazepine medications include: diazepam (Valium), alprazolam (Xanax), clonazepam (Klonopine), lorazepam (Ativan), clorazepate (Tranxene), chlordiazepoxide (Librium), estazolam (Prosom), oxazepam (Serax), temazepam (Restoril), triazolam (Halcion) (Last updated: 07/06/2022) ______________________________________________________________________      ______________________________________________________________________    Medication Recommendations and Reminders  Applies to: All patients receiving prescriptions (written and/or electronic).  Medication Rules & Regulations: You are responsible for reading, knowing, and following our "Medication Rules" document. These exist for your safety and that of others. They are not flexible and neither are we. Dismissing or ignoring them is an act of "non-compliance" that may result in complete and irreversible termination of such medication therapy. For safety reasons, "non-compliance" will not be tolerated. As with the [place] fundamental legal principle of "ignorance of the law is no defense", we will accept no excuses for not having read and knowing the content of documents provided to you by our practice.  Pharmacy of record:  Definition: This is the pharmacy where your electronic prescriptions will be sent.  We do not endorse any particular pharmacy. It is up to you and your insurance to decide what pharmacy to use.  We do not restrict you in your choice of pharmacy. However, once we  write for your prescriptions, we will NOT be re-sending more prescriptions to fix restricted supply problems created by your pharmacy, or your insurance.  The pharmacy listed in the electronic medical record should be the one where you want electronic prescriptions to be sent. If you choose to change pharmacy, simply notify our nursing staff. Changes will be made only during your regular appointments and not over the phone.  Recommendations: Keep all of your pain medications in a safe place, under lock and key, even if you live alone. We will NOT replace lost, stolen, or damaged medication. We do not accept "Police Reports" as proof of medications having been stolen. After you fill your prescription, take 1 week's worth of pills and put them away in a safe place. You should keep a separate, properly labeled bottle for this purpose. The remainder should be kept in the original bottle. Use this as your primary supply, until it runs out. Once it's gone, then you know that you have 1 week's worth of medicine, and it is time to come in for a prescription refill. If you do this correctly, it is unlikely that you will ever run out of medicine. To make sure that the above recommendation works, it is very important that you make sure your medication refill appointments are scheduled at least 1 week before you run out of medicine. To do this in an effective manner, make sure that you do not leave the office without scheduling your  next medication management appointment. Always ask the nursing staff to show you in your prescription , when your medication will be running out. Then arrange for the receptionist to get you a return appointment, at least 7 days before you run out of medicine. Do not wait until you have 1 or 2 pills left, to come in. This is very poor planning and does not take into consideration that we may need to cancel appointments due to bad weather, sickness, or emergencies affecting our staff. DO NOT  ACCEPT A "Partial Fill": If for any reason your pharmacy does not have enough pills/tablets to completely fill or refill your prescription, do not allow for a "partial fill". The law allows the pharmacy to complete that prescription within 72 hours, without requiring a new prescription. If they do not fill the rest of your prescription within those 72 hours, you will need a separate prescription to fill the remaining amount, which we will NOT provide. If the reason for the partial fill is your insurance, you will need to talk to the pharmacist about payment alternatives for the remaining tablets, but again, DO NOT ACCEPT A PARTIAL FILL, unless you can trust your pharmacist to obtain the remainder of the pills within 72 hours.  Prescription refills and/or changes in medication(s):  Prescription refills, and/or changes in dose or medication, will be conducted only during scheduled medication management appointments. (Applies to both, written and electronic prescriptions.) No refills on procedure days. No medication will be changed or started on procedure days. No changes, adjustments, and/or refills will be conducted on a procedure day. Doing so will interfere with the diagnostic portion of the procedure. No phone refills. No medications will be "called into the pharmacy". No Fax refills. No weekend refills. No Holliday refills. No after hours refills.  Remember:  Business hours are:  Monday to Thursday 8:00 AM to 4:00 PM Provider's Schedule: Delano Metz, MD - Appointments are:  Medication management: Monday and Wednesday 8:00 AM to 4:00 PM Procedure day: Tuesday and Thursday 7:30 AM to 4:00 PM Edward Jolly, MD - Appointments are:  Medication management: Tuesday and Thursday 8:00 AM to 4:00 PM Procedure day: Monday and Wednesday 7:30 AM to 4:00 PM (Last update: 07/06/2022) ______________________________________________________________________       ______________________________________________________________________     Naloxone Nasal Spray  Why am I receiving this medication? [place] STOP ACT requires that all patients taking high dose opioids or at risk of opioids respiratory depression, be prescribed an opioid reversal agent, such as Naloxone (AKA: Narcan).  What is this medication? NALOXONE (nal OX one) treats opioid overdose, which causes slow or shallow breathing, severe drowsiness, or trouble staying awake. Call emergency services after using this medication. You may need additional treatment. Naloxone works by reversing the effects of opioids. It belongs to a group of medications called opioid blockers.  COMMON BRAND NAME(S): Kloxxado, Narcan  What should I tell my care team before I take this medication? They need to know if you have any of these conditions: Heart disease Substance use disorder An unusual or allergic reaction to naloxone, other medications, foods, dyes, or preservatives Pregnant or trying to get pregnant Breast-feeding  When to use this medication? This medication is to be used for the treatment of respiratory depression (less than 8 breaths per minute) secondary to opioid overdose.   How to use this medication? This medication is for use in the nose. Lay the person on their back. Support their neck with your hand and allow  the head to tilt back before giving the medication. The nasal spray should be given into 1 nostril. After giving the medication, move the person onto their side. Do not remove or test the nasal spray until ready to use. Get emergency medical help right away after giving the first dose of this medication, even if the person wakes up. You should be familiar with how to recognize the signs and symptoms of a narcotic overdose. If more doses are needed, give the additional dose in the other nostril. Talk to your care team about the use of this medication in children. While this  medication may be prescribed for children as young as newborns for selected conditions, precautions do apply.  Naloxone Overdosage: If you think you have taken too much of this medicine contact a poison control center or emergency room at once.  NOTE: This medicine is only for you. Do not share this medicine with others.  What if I miss a dose? This does not apply.  What may interact with this medication? This is only used during an emergency. No interactions are expected during emergency use. This list may not describe all possible interactions. Give your health care provider a list of all the medicines, herbs, non-prescription drugs, or dietary supplements you use. Also tell them if you smoke, drink alcohol, or use illegal drugs. Some items may interact with your medicine.  What should I watch for while using this medication? Keep this medication ready for use in the case of an opioid overdose. Make sure that you have the phone number of your care team and local hospital ready. You may need to have additional doses of this medication. Each nasal spray contains a single dose. Some emergencies may require additional doses. After use, bring the treated person to the nearest hospital or call 911. Make sure the treating care team knows that the person has received a dose of this medication. You will receive additional instructions on what to do during and after use of this medication before an emergency occurs.  What side effects may I notice from receiving this medication? Side effects that you should report to your care team as soon as possible: Allergic reactions--skin rash, itching, hives, swelling of the face, lips, tongue, or throat Side effects that usually do not require medical attention (report these to your care team if they continue or are bothersome): Constipation Dryness or irritation inside the nose Headache Increase in blood pressure Muscle spasms Stuffy nose Toothache This  list may not describe all possible side effects. Call your doctor for medical advice about side effects. You may report side effects to FDA at 1-800-FDA-1088.  Where should I keep my medication? Because this is an emergency medication, you should keep it with you at all times.  Keep out of the reach of children and pets. Store between 20 and 25 degrees C (68 and 77 degrees F). Do not freeze. Throw away any unused medication after the expiration date. Keep in original box until ready to use.  NOTE: This sheet is a summary. It may not cover all possible information. If you have questions about this medicine, talk to your doctor, pharmacist, or health care provider.   2023 Elsevier/Gold Standard (2021-05-08 00:00:00)  ______________________________________________________________________

## 2023-06-08 NOTE — Progress Notes (Signed)
Nursing Pain Medication Assessment:  Safety precautions to be maintained throughout the outpatient stay will include: orient to surroundings, keep bed in low position, maintain call bell within reach at all times, provide assistance with transfer out of bed and ambulation.  Medication Inspection Compliance: Pill count conducted under aseptic conditions, in front of the patient. Neither the pills nor the bottle was removed from the patient's sight at any time. Once count was completed pills were immediately returned to the patient in their original bottle.  Medication: Oxycodone IR Pill/Patch Count:  60 of 90 pills remain Pill/Patch Appearance: Markings consistent with prescribed medication Bottle Appearance: Standard pharmacy container. Clearly labeled. Filled Date: 46 / 6 / 2024 Last Medication intake:  Today

## 2023-06-14 LAB — TOXASSURE SELECT 13 (MW), URINE

## 2023-06-20 DIAGNOSIS — M797 Fibromyalgia: Secondary | ICD-10-CM | POA: Diagnosis not present

## 2023-06-20 DIAGNOSIS — I152 Hypertension secondary to endocrine disorders: Secondary | ICD-10-CM | POA: Diagnosis not present

## 2023-06-20 DIAGNOSIS — M6281 Muscle weakness (generalized): Secondary | ICD-10-CM | POA: Diagnosis not present

## 2023-06-20 DIAGNOSIS — F32A Depression, unspecified: Secondary | ICD-10-CM | POA: Diagnosis not present

## 2023-06-20 DIAGNOSIS — Z9884 Bariatric surgery status: Secondary | ICD-10-CM | POA: Diagnosis not present

## 2023-06-20 DIAGNOSIS — E1169 Type 2 diabetes mellitus with other specified complication: Secondary | ICD-10-CM | POA: Diagnosis not present

## 2023-06-20 DIAGNOSIS — E118 Type 2 diabetes mellitus with unspecified complications: Secondary | ICD-10-CM | POA: Diagnosis not present

## 2023-06-20 DIAGNOSIS — G4733 Obstructive sleep apnea (adult) (pediatric): Secondary | ICD-10-CM | POA: Diagnosis not present

## 2023-06-20 DIAGNOSIS — E785 Hyperlipidemia, unspecified: Secondary | ICD-10-CM | POA: Diagnosis not present

## 2023-06-20 DIAGNOSIS — E1159 Type 2 diabetes mellitus with other circulatory complications: Secondary | ICD-10-CM | POA: Diagnosis not present

## 2023-06-20 DIAGNOSIS — K912 Postsurgical malabsorption, not elsewhere classified: Secondary | ICD-10-CM | POA: Diagnosis not present

## 2023-06-20 DIAGNOSIS — Z6841 Body Mass Index (BMI) 40.0 and over, adult: Secondary | ICD-10-CM | POA: Diagnosis not present

## 2023-06-20 DIAGNOSIS — E282 Polycystic ovarian syndrome: Secondary | ICD-10-CM | POA: Diagnosis not present

## 2023-06-27 ENCOUNTER — Inpatient Hospital Stay: Payer: Medicare HMO

## 2023-06-27 ENCOUNTER — Encounter: Payer: Self-pay | Admitting: Oncology

## 2023-06-27 ENCOUNTER — Inpatient Hospital Stay: Payer: Medicare HMO | Admitting: Oncology

## 2023-06-27 ENCOUNTER — Other Ambulatory Visit: Payer: Self-pay | Admitting: Nurse Practitioner

## 2023-06-27 ENCOUNTER — Inpatient Hospital Stay: Payer: Medicare HMO | Attending: Oncology

## 2023-06-27 VITALS — BP 109/68 | HR 83 | Temp 96.4°F | Resp 18

## 2023-06-27 VITALS — BP 118/60

## 2023-06-27 DIAGNOSIS — Z79899 Other long term (current) drug therapy: Secondary | ICD-10-CM | POA: Insufficient documentation

## 2023-06-27 DIAGNOSIS — Z9884 Bariatric surgery status: Secondary | ICD-10-CM

## 2023-06-27 DIAGNOSIS — D508 Other iron deficiency anemias: Secondary | ICD-10-CM

## 2023-06-27 DIAGNOSIS — D509 Iron deficiency anemia, unspecified: Secondary | ICD-10-CM | POA: Diagnosis not present

## 2023-06-27 LAB — IRON AND TIBC
Iron: 71 ug/dL (ref 28–170)
Saturation Ratios: 15 % (ref 10.4–31.8)
TIBC: 469 ug/dL — ABNORMAL HIGH (ref 250–450)
UIBC: 398 ug/dL

## 2023-06-27 LAB — CBC WITH DIFFERENTIAL (CANCER CENTER ONLY)
Abs Immature Granulocytes: 0.03 10*3/uL (ref 0.00–0.07)
Basophils Absolute: 0.1 10*3/uL (ref 0.0–0.1)
Basophils Relative: 1 %
Eosinophils Absolute: 0.2 10*3/uL (ref 0.0–0.5)
Eosinophils Relative: 2 %
HCT: 41.2 % (ref 36.0–46.0)
Hemoglobin: 13.5 g/dL (ref 12.0–15.0)
Immature Granulocytes: 0 %
Lymphocytes Relative: 28 %
Lymphs Abs: 2.4 10*3/uL (ref 0.7–4.0)
MCH: 28.4 pg (ref 26.0–34.0)
MCHC: 32.8 g/dL (ref 30.0–36.0)
MCV: 86.7 fL (ref 80.0–100.0)
Monocytes Absolute: 0.5 10*3/uL (ref 0.1–1.0)
Monocytes Relative: 6 %
Neutro Abs: 5.6 10*3/uL (ref 1.7–7.7)
Neutrophils Relative %: 63 %
Platelet Count: 315 10*3/uL (ref 150–400)
RBC: 4.75 MIL/uL (ref 3.87–5.11)
RDW: 13.9 % (ref 11.5–15.5)
WBC Count: 8.8 10*3/uL (ref 4.0–10.5)
nRBC: 0 % (ref 0.0–0.2)

## 2023-06-27 LAB — FERRITIN: Ferritin: 35 ng/mL (ref 11–307)

## 2023-06-27 LAB — VITAMIN B12: Vitamin B-12: 4172 pg/mL — ABNORMAL HIGH (ref 180–914)

## 2023-06-27 MED ORDER — SODIUM CHLORIDE 0.9 % IV SOLN
200.0000 mg | Freq: Once | INTRAVENOUS | Status: AC
  Administered 2023-06-27: 200 mg via INTRAVENOUS
  Filled 2023-06-27: qty 200

## 2023-06-27 MED ORDER — SODIUM CHLORIDE 0.9 % IV SOLN
INTRAVENOUS | Status: DC
  Filled 2023-06-27: qty 250

## 2023-06-27 NOTE — Progress Notes (Signed)
Hematology/Oncology Progress note [phone] [phone]    CHIEF COMPLAINTS/REASON FOR VISIT:  Follow up  iron deficiency anemia  ASSESSMENT & PLAN:   Iron deficiency anemia, unspecified #Iron deficiency anemia  Labs reviewed and discussed with patient Lab Results  Component Value Date   HGB 13.5 06/27/2023   TIBC 469 (H) 06/27/2023   IRONPCTSAT 15 06/27/2023   FERRITIN 35 06/27/2023     Patient has stable hemoglobin and iron panel. Proceed with maintenance Venofer 200 mg x 1 today.  History of gastric bypass B12 is elevated.  Recommend patient to stop B12 supplementation for a few weeks, then resume 2 times per week   Orders Placed This Encounter  Procedures   CBC with Differential (Cancer Center Only)    Standing Status:   Future    Standing Expiration Date:   06/26/2024   Iron and TIBC    Standing Status:   Future    Standing Expiration Date:   06/26/2024   Ferritin    Standing Status:   Future    Standing Expiration Date:   06/26/2024   Vitamin B12    Standing Status:   Future    Standing Expiration Date:   06/26/2024   Follow-up in 6 months. All questions were answered. The patient knows to call the clinic with any problems, questions or concerns.  Rickard Patience, MD, PhD [facility] Hematology Oncology 06/27/2023    HISTORY OF PRESENTING ILLNESS:  Karen Lewis is a  52 y.o.  female with PMH listed below was seen in consultation at the request of Marguarite Arbour, MD   for evaluation of iron deficiency anemia.   Reviewed patient's recent labs  Labs revealed anemia with hemoglobin of .   Reviewed patient's previous labs  05/16/2019 CBC showed hemoglobin 9.4, WBC 11.4, MCV 68.7, platelet count 352,000. Iron panel showed ferritin of 8, iron 34, folate 19.3, No aggravating or improving factors.  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  She follows up with ID Dr.  Noralee Space.  +Chronic lower extremity swelling/lymphedema. Moved from [place] years ago.  Previously worked as a Engineer, civil (consulting).  INTERVAL HISTORY Karen Lewis is a 52 y.o. female who has above history reviewed by me today presents for follow up visit for management of iron deficiency anemia Patient receives maintenance Venofer every 6 months. Patient has chronic fatigue.  No blood in the stool, abdominal pain.   Review of Systems  Constitutional:  Positive for fatigue. Negative for appetite change, chills and fever.  HENT:   Negative for hearing loss and voice change.   Eyes:  Negative for eye problems.  Respiratory:  Negative for chest tightness, cough and shortness of breath.   Cardiovascular:  Positive for leg swelling. Negative for chest pain.  Gastrointestinal:  Negative for abdominal distention, abdominal pain and blood in stool.  Endocrine: Negative for hot flashes.  Genitourinary:  Negative for difficulty urinating and frequency.   Musculoskeletal:  Positive for arthralgias and back pain.  Skin:  Negative for itching and rash.  Neurological:  Negative for extremity weakness.  Hematological:  Negative for adenopathy.  Psychiatric/Behavioral:  Negative for confusion.     MEDICAL HISTORY:  Past Medical History:  Diagnosis Date   Anemia    Anginal pain (HCC)    Anxiety    Arthralgia of hip 07/29/2015   Arthritis    Arthritis, degenerative 07/29/2015   Asthma    Cephalalgia  07/25/2014   Dependence on unknown drug (HCC)    multiplt controlled drug dependence   Depression    Diabetes mellitus without complication (HCC)    Difficult intubation    per pt needs small tube (has had abrasions from tube in past)   Dysrhythmia    PVC's   Eczema    Fibromyalgia    Gastritis    GERD (gastroesophageal reflux disease)    Gonalgia 07/29/2015   Overview:  Overview:  The patient has had bilateral intra-articular Hyalgan injections done on 07/16/2014 and although she seems to do well with  this type of therapy, apparently her insurance company does not want to pay for they Hyalgan. On 11/27/2014 the patient underwent a bilateral genicular nerve block with excellent results. On 01/28/2015 she had a right knee genicular radiofrequency ablatio   Gout    H/O cardiovascular disorder 03/10/2015   H/O surgical procedure 12/05/2012   Overview:  LSG ([facility] - April 2013)     H/O thyroid disease 03/10/2015   Headache    Herpes    History of artificial joint 07/29/2015   History of hiatal hernia    Hypertension    Hypomagnesemia    Hypothyroidism    IDA (iron deficiency anemia) 05/28/2019   LBP (low back pain) 07/29/2015   Neuromuscular disorder (HCC)    Obesity    PCOS (polycystic ovarian syndrome)    Primary osteoarthritis of both knees 07/29/2015   Sleep apnea    not using a Cpap machine at this time - most recent test mild apnea does not qualify for test (not OSA)   Thyroid nodule bilateral   Umbilical hernia     SURGICAL HISTORY: Past Surgical History:  Procedure Laterality Date   APPENDECTOMY     CARPAL TUNNEL RELEASE Bilateral    CHOLECYSTECTOMY     COLONOSCOPY WITH PROPOFOL N/A 02/11/2021   Procedure: COLONOSCOPY WITH PROPOFOL;  Surgeon: Toledo, Boykin Nearing, MD;  Location: [facility];  Service: Gastroenterology;  Laterality: N/A;   DIAGNOSTIC LAPAROSCOPY     ESOPHAGOGASTRODUODENOSCOPY (EGD) WITH PROPOFOL N/A 02/11/2021   Procedure: ESOPHAGOGASTRODUODENOSCOPY (EGD) WITH PROPOFOL;  Surgeon: Toledo, Boykin Nearing, MD;  Location: [facility];  Service: Gastroenterology;  Laterality: N/A;   HIATAL HERNIA REPAIR     JOINT REPLACEMENT Bilateral hip   LAPAROSCOPIC PARTIAL GASTRECTOMY     left trigger finger     peniculectomy N/A 07/05/2018   ROUX-EN-Y GASTRIC BYPASS  06/03/2017   SHOULDER ARTHROSCOPY Right    THYROIDECTOMY N/A 11/12/2015   Procedure: THYROIDECTOMY;  Surgeon: Geanie Logan, MD;  Location: [facility] OR[facility];  Service: ENT;  Laterality: N/A;   TOTAL HIP ARTHROPLASTY  Right 11/27/2019   Procedure: TOTAL HIP ARTHROPLASTY ANTERIOR APPROACH;  Surgeon: Kennedy Bucker, MD;  Location: [facility] OR[facility];  Service: Orthopedics;  Laterality: Right;   TRIGGER FINGER RELEASE Right    TRIGGER FINGER RELEASE Right 04/24/2020   Procedure: Right ring and middle trigger release;  Surgeon: Kennedy Bucker, MD;  Location: [facility] OR[facility];  Service: Orthopedics;  Laterality: Right;    SOCIAL HISTORY: Social History   Socioeconomic History   Marital status: Married    Spouse name: Not on file   Number of children: 0   Years of education: 12   Highest education level: High school graduate  Occupational History   Occupation: Disabled  Tobacco Use   Smoking status: Never   Smokeless tobacco: Never  Vaping Use   Vaping status: Never Used  Substance and Sexual Activity   Alcohol use:  No    Alcohol/week: 0.0 standard drinks of alcohol   Drug use: No   Sexual activity: Not Currently    Birth control/protection: Injection  Other Topics Concern   Not on file  Social History Narrative   Not on file   Social Determinants of Health   Financial Resource Strain: Medium Risk (04/05/2023)   Received from [facility]   Overall Financial Resource Strain (CARDIA)    Difficulty of Paying Living Expenses: Somewhat hard  Food Insecurity: Food Insecurity Present (04/05/2023)   Received from [facility]   Hunger Vital Sign    Worried About Running Out of Food in the Last Year: Sometimes true    Ran Out of Food in the Last Year: Sometimes true  Transportation Needs: Unmet Transportation Needs (04/05/2023)   Received from [facility] - Transportation    In the past 12 months, has lack of transportation kept you from medical appointments or from getting medications?: Yes    Lack of Transportation (Non-Medical): Yes  Physical Activity: Inactive (12/14/2019)   Exercise Vital Sign    Days of Exercise per Week: 0 days    Minutes of Exercise  per Session: 0 min  Stress: No Stress Concern Present (01/20/2022)   [employer] of Occupational Health - Occupational Stress Questionnaire    Feeling of Stress : Only a little  Social Connections: Moderately Integrated (01/20/2022)   Social Connection and Isolation Panel [NHANES]    Frequency of Communication with Friends and Family: More than three times a week    Frequency of Social Gatherings with Friends and Family: More than three times a week    Attends Religious Services: More than 4 times per year    Active Member of [employer] or Organizations: No    Attends Banker Meetings: Never    Marital Status: Married  Catering manager Violence: Not At Risk (12/14/2019)   Humiliation, Afraid, Rape, and Kick questionnaire    Fear of Current or Ex-Partner: No    Emotionally Abused: No    Physically Abused: No    Sexually Abused: No    FAMILY HISTORY: Family History  Problem Relation Age of Onset   Anxiety disorder Mother    Depression Mother    Alcohol abuse Mother    Diabetes Mother    Hypertension Mother    Kidney cancer Mother    Sleep apnea Mother    Alcohol abuse Father    Anxiety disorder Father    Depression Father    Post-traumatic stress disorder Father    Kidney failure Father    COPD Father    Diabetes Father    Hypertension Father    Sleep apnea Father    Depression Brother    Diabetes Brother    Hypertension Brother    Sleep apnea Brother    Breast cancer Paternal Aunt 55   Breast cancer Paternal Aunt 59   Brain cancer Paternal Uncle 61   Bladder Cancer Neg Hx    Prostate cancer Neg Hx     ALLERGIES:  is allergic to bactrim [sulfamethoxazole-trimethoprim], omalizumab, ciprofloxacin, shellfish allergy, and nsaids.  MEDICATIONS:  Current Outpatient Medications  Medication Sig Dispense Refill   ACCU-CHEK AVIVA PLUS test strip      acetaminophen (TYLENOL) 650 MG CR tablet Take 650-1,300 mg by mouth every 8 (eight) hours as needed for pain.      acidophilus (RISAQUAD) CAPS capsule Take 1 capsule by mouth daily.  albuterol (PROVENTIL HFA;VENTOLIN HFA) 108 (90 BASE) MCG/ACT inhaler Inhale 2 puffs into the lungs every 6 (six) hours as needed for wheezing or shortness of breath.     Alcohol Swabs (ALCOHOL PREP) 70 % PADS Apply topically.     amitriptyline (ELAVIL) 75 MG tablet Take 100 mg by mouth at bedtime.     Biotin 10 MG CAPS Take 10 mg by mouth daily.      butalbital-acetaminophen-caffeine (FIORICET) 50-325-40 MG tablet Take 1 tablet by mouth every 6 (six) hours as needed for migraine.      calcium carbonate (TUMS EX) 750 MG chewable tablet Chew 1 tablet by mouth daily.      clonazePAM (KLONOPIN) 0.5 MG tablet Take 0.5 mg by mouth 2 (two) times daily as needed for anxiety.     diclofenac sodium (VOLTAREN) 1 % GEL Apply 2 g topically 4 (four) times daily. (Patient taking differently: Apply 2 g topically 3 (three) times daily as needed (pain).) 100 g 1   diphenhydrAMINE (BENADRYL) 25 MG tablet Take 25 mg by mouth every 8 (eight) hours as needed for itching or allergies.      DULoxetine (CYMBALTA) 60 MG capsule Take 60 mg by mouth daily.     EPINEPHrine 0.3 mg/0.3 mL IJ SOAJ injection Inject 0.3 mg into the muscle as needed for anaphylaxis.     ergocalciferol (VITAMIN D2) 1.25 MG (50000 UT) capsule Take 50,000 Units by mouth 2 (two) times a week.      famotidine (PEPCID) 20 MG tablet Take 20 mg by mouth at bedtime.     fluticasone furoate-vilanterol (BREO ELLIPTA) 200-25 MCG/INH AEPB Inhale 1 puff into the lungs daily.     Galcanezumab-gnlm (EMGALITY) 120 MG/ML SOAJ Inject 120 mg into the skin every 30 (thirty) days.     glucose blood (ACCU-CHEK AVIVA PLUS) test strip Use 2 (two) times daily     levocetirizine (XYZAL) 5 MG tablet Take 5 mg by mouth at bedtime.      levothyroxine (SYNTHROID) 175 MCG tablet Take 175 mcg by mouth daily before breakfast.     lisinopril (ZESTRIL) 5 MG tablet Take 5 mg by mouth daily.     magnesium oxide  (MAG-OX) 400 MG tablet Take 1,200 mg by mouth 2 (two) times daily. Take 1200 mg by mouth in the morning and 1200 mg at bedtime     meclizine (ANTIVERT) 25 MG tablet Take 25 mg by mouth 3 (three) times daily as needed for dizziness.      metFORMIN (GLUCOPHAGE-XR) 500 MG 24 hr tablet Take 1,000 mg by mouth 2 (two) times daily.     metoprolol tartrate (LOPRESSOR) 50 MG tablet Take by mouth.     montelukast (SINGULAIR) 10 MG tablet Take 10 mg by mouth at bedtime.      Multiple Vitamin (TAB-A-VITE) TABS Take 1 tablet by mouth daily.     naloxone (NARCAN) nasal spray 4 mg/0.1 mL Place 1 spray into the nose as needed for up to 365 doses (for opioid-induced respiratory depresssion). In case of emergency (overdose), spray once into each nostril. If no response within 3 minutes, repeat application and call 911. 1 each 0   naphazoline-pheniramine (NAPHCON-A) 0.025-0.3 % ophthalmic solution Place 1 drop into both eyes 4 (four) times daily as needed for eye irritation.     nitrofurantoin (MACRODANTIN) 100 MG capsule Take 1 capsule (100 mg total) by mouth 4 (four) times daily. 30 capsule 11   omeprazole (PRILOSEC) 40 MG capsule Take 40 mg by  mouth in the morning and at bedtime.     oxyCODONE (OXY IR/ROXICODONE) 5 MG immediate release tablet Take 1 tablet (5 mg total) by mouth every 8 (eight) hours as needed for severe pain. Must last 30 days 90 tablet 0   [START ON 07/19/2023] oxyCODONE (OXY IR/ROXICODONE) 5 MG immediate release tablet Take 1 tablet (5 mg total) by mouth every 8 (eight) hours as needed for severe pain. Must last 30 days 90 tablet 0   [START ON 08/18/2023] oxyCODONE (OXY IR/ROXICODONE) 5 MG immediate release tablet Take 1 tablet (5 mg total) by mouth every 8 (eight) hours as needed for severe pain. Must last 30 days 90 tablet 0   OZEMPIC, 0.25 OR 0.5 MG/DOSE, 2 MG/1.5ML SOPN Inject 1 mg into the skin once a week.     phentermine (ADIPEX-P) 37.5 MG tablet Take 37.5 mg by mouth daily before breakfast.      pregabalin (LYRICA) 225 MG capsule Take 225 mg by mouth 2 (two) times daily.     rizatriptan (MAXALT) 10 MG tablet Take 1 and may repeat in 2 hours for MIgraines, alternate with the disintrgrating tablet.     rizatriptan (MAXALT-MLT) 10 MG disintegrating tablet Take 10 mg by mouth. Take 1 and may repeat in 2 hours  for Migraines     rosuvastatin (CRESTOR) 40 MG tablet Take 40 mg by mouth daily.      tiZANidine (ZANAFLEX) 4 MG tablet Take 1 tablet (4 mg total) by mouth 2 (two) times daily as needed for muscle spasms. 30 tablet 0   topiramate (TOPAMAX) 50 MG tablet Take 100 mg by mouth at bedtime.     No current facility-administered medications for this visit.   Facility-Administered Medications Ordered in Other Visits  Medication Dose Route Frequency Provider Last Rate Last Admin   0.9 %  sodium chloride infusion   Intravenous Continuous Rickard Patience, MD   Stopped at 06/27/23 1654     PHYSICAL EXAMINATION: ECOG PERFORMANCE STATUS: 1 - Symptomatic but completely ambulatory Vitals:   06/27/23 1457  BP: 109/68  Pulse: 83  Resp: 18  Temp: (!) 96.4 F (35.8 C)  SpO2: 97%   There were no vitals filed for this visit.   Physical Exam Constitutional:      General: She is not in acute distress.    Appearance: She is obese.  HENT:     Head: Normocephalic and atraumatic.  Eyes:     General: No scleral icterus.    Pupils: Pupils are equal, round, and reactive to light.  Cardiovascular:     Rate and Rhythm: Normal rate and regular rhythm.  Pulmonary:     Effort: Pulmonary effort is normal. No respiratory distress.     Breath sounds: No wheezing.  Abdominal:     General: Bowel sounds are normal.     Palpations: Abdomen is soft.  Musculoskeletal:        General: Normal range of motion.     Cervical back: Normal range of motion and neck supple.  Skin:    General: Skin is warm and dry.  Neurological:     Mental Status: She is alert and oriented to person, place, and time. Mental  status is at baseline.     Cranial Nerves: No cranial nerve deficit.  Psychiatric:        Mood and Affect: Mood normal.     LABORATORY DATA:  I have reviewed the data as listed    Latest Ref Rng & Units 06/27/2023  11:03 AM 11/24/2022   10:08 AM 08/08/2022    3:51 AM  CBC  WBC 4.0 - 10.5 K/uL 8.8  14.8  7.3   Hemoglobin 12.0 - 15.0 g/dL 16.1  09.6  04.5   Hematocrit 36.0 - 46.0 % 41.2  46.6  34.7   Platelets 150 - 400 K/uL 315  354  201       Latest Ref Rng & Units 08/09/2022    4:18 AM 08/08/2022    3:51 AM 08/07/2022    3:37 PM  CMP  Glucose 70 - 99 mg/dL 409  811  96   BUN 6 - 20 mg/dL 17  31  39   Creatinine 0.44 - 1.00 mg/dL 9.14  7.82  9.56   Sodium 135 - 145 mmol/L 142  140  135   Potassium 3.5 - 5.1 mmol/L 4.8  4.9  5.3   Chloride 98 - 111 mmol/L 116  113  108   CO2 22 - 32 mmol/L 21  19  18    Calcium 8.9 - 10.3 mg/dL 7.8  7.3  7.3   Total Protein 6.5 - 8.1 g/dL   6.2   Total Bilirubin 0.3 - 1.2 mg/dL   0.5   Alkaline Phos 38 - 126 U/L   74   AST 15 - 41 U/L   25   ALT 0 - 44 U/L   40    Iron/TIBC/Ferritin/ %Sat    Component Value Date/Time   IRON 71 06/27/2023 1103   TIBC 469 (H) 06/27/2023 1103   FERRITIN 35 06/27/2023 1103   IRONPCTSAT 15 06/27/2023 1103     No results found.

## 2023-06-27 NOTE — Progress Notes (Signed)
Venofer signed on behalf of Dr Cathie Hoops

## 2023-06-27 NOTE — Patient Instructions (Signed)
Iron Sucrose Injection What is this medication? IRON SUCROSE (EYE ern SOO krose) treats low levels of iron (iron deficiency anemia) in people with kidney disease. Iron is a mineral that plays an important role in making red blood cells, which carry oxygen from your lungs to the rest of your body. This medicine may be used for other purposes; ask your health care provider or pharmacist if you have questions. COMMON BRAND NAME(S): Venofer What should I tell my care team before I take this medication? They need to know if you have any of these conditions: Anemia not caused by low iron levels Heart disease High levels of iron in the blood Kidney disease Liver disease An unusual or allergic reaction to iron, other medications, foods, dyes, or preservatives Pregnant or trying to get pregnant Breastfeeding How should I use this medication? This medication is for infusion into a vein. It is given in a hospital or clinic setting. Talk to your care team about the use of this medication in children. While this medication may be prescribed for children as young as 2 years for selected conditions, precautions do apply. Overdosage: If you think you have taken too much of this medicine contact a poison control center or emergency room at once. NOTE: This medicine is only for you. Do not share this medicine with others. What if I miss a dose? Keep appointments for follow-up doses. It is important not to miss your dose. Call your care team if you are unable to keep an appointment. What may interact with this medication? Do not take this medication with any of the following: Deferoxamine Dimercaprol Other iron products This medication may also interact with the following: Chloramphenicol Deferasirox This list may not describe all possible interactions. Give your health care provider a list of all the medicines, herbs, non-prescription drugs, or dietary supplements you use. Also tell them if you smoke,  drink alcohol, or use illegal drugs. Some items may interact with your medicine. What should I watch for while using this medication? Visit your care team regularly. Tell your care team if your symptoms do not start to get better or if they get worse. You may need blood work done while you are taking this medication. You may need to follow a special diet. Talk to your care team. Foods that contain iron include: whole grains/cereals, dried fruits, beans, or peas, leafy green vegetables, and organ meats (liver, kidney). What side effects may I notice from receiving this medication? Side effects that you should report to your care team as soon as possible: Allergic reactions--skin rash, itching, hives, swelling of the face, lips, tongue, or throat Low blood pressure--dizziness, feeling faint or lightheaded, blurry vision Shortness of breath Side effects that usually do not require medical attention (report to your care team if they continue or are bothersome): Flushing Headache Joint pain Muscle pain Nausea Pain, redness, or irritation at injection site This list may not describe all possible side effects. Call your doctor for medical advice about side effects. You may report side effects to FDA at 1-800-FDA-1088. Where should I keep my medication? This medication is given in a hospital or clinic. It will not be stored at home. NOTE: This sheet is a summary. It may not cover all possible information. If you have questions about this medicine, talk to your doctor, pharmacist, or health care provider.  2024 Elsevier/Gold Standard (2023-02-04 00:00:00)

## 2023-06-27 NOTE — Assessment & Plan Note (Signed)
#  Iron deficiency anemia  Labs reviewed and discussed with patient Lab Results  Component Value Date   HGB 13.5 06/27/2023   TIBC 469 (H) 06/27/2023   IRONPCTSAT 15 06/27/2023   FERRITIN 35 06/27/2023     Patient has stable hemoglobin and iron panel. Proceed with maintenance Venofer 200 mg x 1 today.

## 2023-06-27 NOTE — Assessment & Plan Note (Addendum)
B12 is elevated.  Recommend patient to stop B12 supplementation for a few weeks, then resume 2 times per week

## 2023-06-28 ENCOUNTER — Ambulatory Visit: Payer: Medicare HMO | Admitting: Pain Medicine

## 2023-06-29 DIAGNOSIS — F331 Major depressive disorder, recurrent, moderate: Secondary | ICD-10-CM | POA: Diagnosis not present

## 2023-06-30 ENCOUNTER — Ambulatory Visit: Payer: Medicare HMO | Admitting: Pain Medicine

## 2023-07-04 DIAGNOSIS — F607 Dependent personality disorder: Secondary | ICD-10-CM | POA: Diagnosis not present

## 2023-07-04 DIAGNOSIS — F331 Major depressive disorder, recurrent, moderate: Secondary | ICD-10-CM | POA: Diagnosis not present

## 2023-07-04 DIAGNOSIS — Z630 Problems in relationship with spouse or partner: Secondary | ICD-10-CM | POA: Diagnosis not present

## 2023-07-05 DIAGNOSIS — F607 Dependent personality disorder: Secondary | ICD-10-CM | POA: Diagnosis not present

## 2023-07-05 DIAGNOSIS — F331 Major depressive disorder, recurrent, moderate: Secondary | ICD-10-CM | POA: Diagnosis not present

## 2023-07-05 DIAGNOSIS — Z630 Problems in relationship with spouse or partner: Secondary | ICD-10-CM | POA: Diagnosis not present

## 2023-07-07 ENCOUNTER — Encounter: Payer: Self-pay | Admitting: Pain Medicine

## 2023-07-07 ENCOUNTER — Ambulatory Visit
Admission: RE | Admit: 2023-07-07 | Discharge: 2023-07-07 | Disposition: A | Discharge status: Home / Self Care | Payer: Medicare HMO | Source: Ambulatory Visit | Attending: Pain Medicine | Admitting: Pain Medicine

## 2023-07-07 ENCOUNTER — Ambulatory Visit: Payer: Medicare HMO | Attending: Pain Medicine | Admitting: Pain Medicine

## 2023-07-07 VITALS — BP 100/66 | HR 82 | Temp 97.1°F | Resp 22 | Ht 63.0 in | Wt 334.2 lb

## 2023-07-07 DIAGNOSIS — E66813 Obesity, class 3: Secondary | ICD-10-CM

## 2023-07-07 DIAGNOSIS — M47816 Spondylosis without myelopathy or radiculopathy, lumbar region: Secondary | ICD-10-CM | POA: Insufficient documentation

## 2023-07-07 DIAGNOSIS — M4316 Spondylolisthesis, lumbar region: Secondary | ICD-10-CM | POA: Insufficient documentation

## 2023-07-07 DIAGNOSIS — M4310 Spondylolisthesis, site unspecified: Secondary | ICD-10-CM

## 2023-07-07 DIAGNOSIS — G8929 Other chronic pain: Secondary | ICD-10-CM | POA: Insufficient documentation

## 2023-07-07 DIAGNOSIS — Z886 Allergy status to analgesic agent status: Secondary | ICD-10-CM | POA: Insufficient documentation

## 2023-07-07 DIAGNOSIS — M545 Low back pain, unspecified: Secondary | ICD-10-CM | POA: Diagnosis not present

## 2023-07-07 DIAGNOSIS — M5459 Other low back pain: Secondary | ICD-10-CM | POA: Insufficient documentation

## 2023-07-07 DIAGNOSIS — Z6841 Body Mass Index (BMI) 40.0 and over, adult: Secondary | ICD-10-CM | POA: Diagnosis not present

## 2023-07-07 DIAGNOSIS — Z888 Allergy status to other drugs, medicaments and biological substances status: Secondary | ICD-10-CM | POA: Insufficient documentation

## 2023-07-07 MED ORDER — FENTANYL CITRATE (PF) 100 MCG/2ML IJ SOLN
INTRAMUSCULAR | Status: AC
  Filled 2023-07-07: qty 2

## 2023-07-07 MED ORDER — ROPIVACAINE HCL 2 MG/ML IJ SOLN
18.0000 mL | Freq: Once | INTRAMUSCULAR | Status: AC
  Administered 2023-07-07: 18 mL via PERINEURAL

## 2023-07-07 MED ORDER — PENTAFLUOROPROP-TETRAFLUOROETH EX AERO
INHALATION_SPRAY | Freq: Once | CUTANEOUS | Status: DC
  Filled 2023-07-07: qty 30

## 2023-07-07 MED ORDER — LIDOCAINE HCL 2 % IJ SOLN
20.0000 mL | Freq: Once | INTRAMUSCULAR | Status: AC
  Administered 2023-07-07: 400 mg

## 2023-07-07 MED ORDER — MIDAZOLAM HCL 5 MG/5ML IJ SOLN
0.5000 mg | Freq: Once | INTRAMUSCULAR | Status: AC
  Administered 2023-07-07: 3 mg via INTRAVENOUS

## 2023-07-07 MED ORDER — LACTATED RINGERS IV SOLN: Freq: Once | INTRAVENOUS | Status: AC

## 2023-07-07 MED ORDER — TRIAMCINOLONE ACETONIDE 40 MG/ML IJ SUSP
INTRAMUSCULAR | Status: AC
Start: 2023-07-07 — End: ?
  Filled 2023-07-07: qty 2

## 2023-07-07 MED ORDER — FENTANYL CITRATE (PF) 100 MCG/2ML IJ SOLN
25.0000 ug | INTRAMUSCULAR | Status: DC | PRN
  Administered 2023-07-07: 50 ug via INTRAVENOUS

## 2023-07-07 MED ORDER — ROPIVACAINE HCL 2 MG/ML IJ SOLN
INTRAMUSCULAR | Status: AC
  Filled 2023-07-07: qty 20

## 2023-07-07 MED ORDER — TRIAMCINOLONE ACETONIDE 40 MG/ML IJ SUSP
80.0000 mg | Freq: Once | INTRAMUSCULAR | Status: AC
  Administered 2023-07-07: 80 mg

## 2023-07-07 MED ORDER — MIDAZOLAM HCL 5 MG/5ML IJ SOLN
INTRAMUSCULAR | Status: AC
  Filled 2023-07-07: qty 5

## 2023-07-07 MED ORDER — LIDOCAINE HCL 2 % IJ SOLN
INTRAMUSCULAR | Status: AC
  Filled 2023-07-07: qty 20

## 2023-07-07 NOTE — Patient Instructions (Signed)

## 2023-07-07 NOTE — Progress Notes (Signed)
Safety precautions to be maintained throughout the outpatient stay will include: orient to surroundings, keep bed in low position, maintain call bell within reach at all times, provide assistance with transfer out of bed and ambulation.  

## 2023-07-07 NOTE — Progress Notes (Signed)
PROVIDER NOTE: Interpretation of information contained herein should be left to medically-trained personnel. Specific patient instructions are provided elsewhere under "Patient Instructions" section of medical record. This document was created in part using STT-dictation technology, any transcriptional errors that may result from this process are unintentional.  Patient: Karen Lewis Type: Established DOB: [birth year] MRN: [id number] PCP: Marguarite Arbour, MD  Service: Procedure DOS: 07/07/2023 Setting: Ambulatory Location: Ambulatory outpatient facility Delivery: Face-to-face Provider: Oswaldo Done, MD Specialty: Interventional Pain Management Specialty designation: 09 Location: Outpatient facility Ref. Prov.: Marguarite Arbour, MD       Interventional Therapy   Procedure: Lumbar Facet, Medial Branch Block(s)  #9   Laterality: Bilateral  Level: L2, L3, L4, L5, and S1 Medial Branch Level(s). Injecting these levels blocks the L3-4, L4-5, and L5-S1 lumbar facet joints. Imaging: Fluoroscopic guidance Spinal ([id number]) Anesthesia: Local anesthesia (1-2% Lidocaine) Anxiolysis: IV Versed 3.0 mg Sedation: Moderate Sedation Fentanyl 1 mL (50 mcg) DOS: 07/07/2023 Performed by: Oswaldo Done, MD  Primary Purpose: Diagnostic/Therapeutic Indications: Low back pain severe enough to impact quality of life or function. 1. Chronic low back pain (3ry area of Pain) (Bilateral) (R>L) w/o sciatica   2. Grade 1  Anterolisthesis of L3/L4 & L4/L5 (1.4 cm)   3. Lumbar facet arthropathy (Multilevel) (Bilateral)   4. Lumbar facet syndrome (Bilateral) (R>L)   5. Lumbar facet joint pain    NAS-11 Pain score:   Pre-procedure: 5 /10   Post-procedure: 0-No pain/10   Note: Today she indicated beginning to have some of the right thigh burning sensation again, specially when she lays down at night with legs stretched.    Position / Prep / Materials:  Position: Prone  Prep solution: ChloraPrep  (2% chlorhexidine gluconate and 70% isopropyl alcohol) Area Prepped: Posterolateral Lumbosacral Spine (Wide prep: From the lower border of the scapula down to the end of the tailbone and from flank to flank.)  Materials:  Tray: Block Needle(s):  Type: Spinal  Gauge (G): 22  Length: 7-in (We may use 5" next time. She has lost weight.) Qty: 4      H&P (Pre-op Assessment):  Karen Lewis is a 52 y.o. (year old), female patient, seen today for interventional treatment. She  has a past surgical history that includes Laparoscopic partial gastrectomy; Shoulder arthroscopy (Right); Carpal tunnel release (Bilateral); Diagnostic laparoscopy; Cholecystectomy; Trigger finger release (Right); Thyroidectomy (N/A, 11/12/2015); left trigger finger; Roux-en-Y Gastric Bypass (06/03/2017); Hiatal hernia repair; peniculectomy (N/A, 07/05/2018); Total hip arthroplasty (Right, 11/27/2019); Joint replacement (Bilateral, hip); Appendectomy; Trigger finger release (Right, 04/24/2020); Colonoscopy with propofol (N/A, 02/11/2021); and Esophagogastroduodenoscopy (egd) with propofol (N/A, 02/11/2021). Karen Lewis has a current medication list which includes the following prescription(s): accu-chek aviva plus, acetaminophen, acidophilus, albuterol, alcohol prep, amitriptyline, biotin, butalbital-acetaminophen-caffeine, calcium carbonate, clonazepam, diclofenac sodium, diphenhydramine, duloxetine, epinephrine, ergocalciferol, famotidine, fluticasone furoate-vilanterol, emgality, accu-chek aviva plus, levocetirizine, levothyroxine, lisinopril, magnesium oxide, meclizine, metformin, metoprolol tartrate, montelukast, tab-a-vite, naloxone, naphazoline-pheniramine, nitrofurantoin, omeprazole, oxycodone, [START ON 07/19/2023] oxycodone, [START ON 08/18/2023] oxycodone, ozempic (0.25 or 0.5 mg/dose), phentermine, pregabalin, rizatriptan, rizatriptan, rosuvastatin, tizanidine, and topiramate, and the following Facility-Administered Medications: fentanyl and  pentafluoroprop-tetrafluoroeth. Her primarily concern today is the Back Pain and Knee Pain (bilat)  Initial Vital Signs:  Pulse/HCG Rate: 84ECG Heart Rate: 82 (NSR) Temp: 97.9 F (36.6 C) Resp: 17 BP: 122/68 SpO2: 98 %  BMI: Estimated body mass index is 59.2 kg/m as calculated from the following:   Height as of this encounter: 5\' 3"  (1.6 m).   Weight  as of this encounter: 334 lb 3.2 oz (151.6 kg).  Risk Assessment: Allergies: Reviewed. She is allergic to bactrim [sulfamethoxazole-trimethoprim], omalizumab, ciprofloxacin, shellfish allergy, and nsaids.  Allergy Precautions: None required Coagulopathies: Reviewed. None identified.  Blood-thinner therapy: None at this time Active Infection(s): Reviewed. None identified. Ms. Werline is afebrile  Site Confirmation: Karen Lewis was asked to confirm the procedure and laterality before marking the site Procedure checklist: Completed Consent: Before the procedure and under the influence of no sedative(s), amnesic(s), or anxiolytics, the patient was informed of the treatment options, risks and possible complications. To fulfill our ethical and legal obligations, as recommended by the American Medical Association's Code of Ethics, I have informed the patient of my clinical impression; the nature and purpose of the treatment or procedure; the risks, benefits, and possible complications of the intervention; the alternatives, including doing nothing; the risk(s) and benefit(s) of the alternative treatment(s) or procedure(s); and the risk(s) and benefit(s) of doing nothing. The patient was provided information about the general risks and possible complications associated with the procedure. These may include, but are not limited to: failure to achieve desired goals, infection, bleeding, organ or nerve damage, allergic reactions, paralysis, and death. In addition, the patient was informed of those risks and complications associated to Spine-related procedures,  such as failure to decrease pain; infection (i.e.: Meningitis, epidural or intraspinal abscess); bleeding (i.e.: epidural hematoma, subarachnoid hemorrhage, or any other type of intraspinal or peri-dural bleeding); organ or nerve damage (i.e.: Any type of peripheral nerve, nerve root, or spinal cord injury) with subsequent damage to sensory, motor, and/or autonomic systems, resulting in permanent pain, numbness, and/or weakness of one or several areas of the body; allergic reactions; (i.e.: anaphylactic reaction); and/or death. Furthermore, the patient was informed of those risks and complications associated with the medications. These include, but are not limited to: allergic reactions (i.e.: anaphylactic or anaphylactoid reaction(s)); adrenal axis suppression; blood sugar elevation that in diabetics may result in ketoacidosis or comma; water retention that in patients with history of congestive heart failure may result in shortness of breath, pulmonary edema, and decompensation with resultant heart failure; weight gain; swelling or edema; medication-induced neural toxicity; particulate matter embolism and blood vessel occlusion with resultant organ, and/or nervous system infarction; and/or aseptic necrosis of one or more joints. Finally, the patient was informed that Medicine is not an exact science; therefore, there is also the possibility of unforeseen or unpredictable risks and/or possible complications that may result in a catastrophic outcome. The patient indicated having understood very clearly. We have given the patient no guarantees and we have made no promises. Enough time was given to the patient to ask questions, all of which were answered to the patient's satisfaction. Ms. Sheehy has indicated that she wanted to continue with the procedure. Attestation: I, the ordering provider, attest that I have discussed with the patient the benefits, risks, side-effects, alternatives, likelihood of achieving  goals, and potential problems during recovery for the procedure that I have provided informed consent. Date  Time: 07/07/2023  9:15 AM   Pre-Procedure Preparation:  Monitoring: As per clinic protocol. Respiration, ETCO2, SpO2, BP, heart rate and rhythm monitor placed and checked for adequate function Safety Precautions: Patient was assessed for positional comfort and pressure points before starting the procedure. Time-out: I initiated and conducted the "Time-out" before starting the procedure, as per protocol. The patient was asked to participate by confirming the accuracy of the "Time Out" information. Verification of the correct person, site, and procedure were  performed and confirmed by me, the nursing staff, and the patient. "Time-out" conducted as per Joint Commission's Universal Protocol (UP.01.01.01). Time: 1025 Start Time: 1025 hrs.  Description of Procedure:          Laterality: (see above) Targeted Levels: (see above)  Safety Precautions: Aspiration looking for blood return was conducted prior to all injections. At no point did we inject any substances, as a needle was being advanced. Before injecting, the patient was told to immediately notify me if she was experiencing any new onset of "ringing in the ears, or metallic taste in the mouth". No attempts were made at seeking any paresthesias. Safe injection practices and needle disposal techniques used. Medications properly checked for expiration dates. SDV (single dose vial) medications used. After the completion of the procedure, all disposable equipment used was discarded in the proper designated medical waste containers. Local Anesthesia: Protocol guidelines were followed. The patient was positioned over the fluoroscopy table. The area was prepped in the usual manner. The time-out was completed. The target area was identified using fluoroscopy. A 12-in long, straight, sterile hemostat was used with fluoroscopic guidance to locate the  targets for each level blocked. Once located, the skin was marked with an approved surgical skin marker. Once all sites were marked, the skin (epidermis, dermis, and hypodermis), as well as deeper tissues (fat, connective tissue and muscle) were infiltrated with a small amount of a short-acting local anesthetic, loaded on a 10cc syringe with a 25G, 1.5-in  Needle. An appropriate amount of time was allowed for local anesthetics to take effect before proceeding to the next step. Local Anesthetic: Lidocaine 2.0% The unused portion of the local anesthetic was discarded in the proper designated containers. Technical description of process:  L2 Medial Branch Nerve Block (MBB): The target area for the L2 medial branch is at the junction of the postero-lateral aspect of the superior articular process and the superior, posterior, and medial edge of the transverse process of L3. Under fluoroscopic guidance, a Quincke needle was inserted until contact was made with os over the superior postero-lateral aspect of the pedicular shadow (target area). After negative aspiration for blood, 0.5 mL of the nerve block solution was injected without difficulty or complication. The needle was removed intact. L3 Medial Branch Nerve Block (MBB): The target area for the L3 medial branch is at the junction of the postero-lateral aspect of the superior articular process and the superior, posterior, and medial edge of the transverse process of L4. Under fluoroscopic guidance, a Quincke needle was inserted until contact was made with os over the superior postero-lateral aspect of the pedicular shadow (target area). After negative aspiration for blood, 0.5 mL of the nerve block solution was injected without difficulty or complication. The needle was removed intact. L4 Medial Branch Nerve Block (MBB): The target area for the L4 medial branch is at the junction of the postero-lateral aspect of the superior articular process and the superior,  posterior, and medial edge of the transverse process of L5. Under fluoroscopic guidance, a Quincke needle was inserted until contact was made with os over the superior postero-lateral aspect of the pedicular shadow (target area). After negative aspiration for blood, 0.5 mL of the nerve block solution was injected without difficulty or complication. The needle was removed intact. L5 Medial Branch Nerve Block (MBB): The target area for the L5 medial branch is at the junction of the postero-lateral aspect of the superior articular process and the superior, posterior, and medial edge of the  sacral ala. Under fluoroscopic guidance, a Quincke needle was inserted until contact was made with os over the superior postero-lateral aspect of the pedicular shadow (target area). After negative aspiration for blood, 0.5 mL of the nerve block solution was injected without difficulty or complication. The needle was removed intact. S1 Medial Branch Nerve Block (MBB): The target area for the S1 medial branch is at the posterior and inferior 6 o'clock position of the L5-S1 facet joint. Under fluoroscopic guidance, the Quincke needle inserted for the L5 MBB was redirected until contact was made with os over the inferior and postero aspect of the sacrum, at the 6 o' clock position under the L5-S1 facet joint (Target area). After negative aspiration for blood, 0.5 mL of the nerve block solution was injected without difficulty or complication. The needle was removed intact.  Once the entire procedure was completed, the treated area was cleaned, making sure to leave some of the prepping solution back to take advantage of its long term bactericidal properties.         Illustration of the posterior view of the lumbar spine and the posterior neural structures. Laminae of L2 through S1 are labeled. DPRL5, dorsal primary ramus of L5; DPRS1, dorsal primary ramus of S1; DPR3, dorsal primary ramus of L3; FJ, facet (zygapophyseal) joint  L3-L4; I, inferior articular process of L4; LB1, lateral branch of dorsal primary ramus of L1; IAB, inferior articular branches from L3 medial branch (supplies L4-L5 facet joint); IBP, intermediate branch plexus; MB3, medial branch of dorsal primary ramus of L3; NR3, third lumbar nerve root; S, superior articular process of L5; SAB, superior articular branches from L4 (supplies L4-5 facet joint also); TP3, transverse process of L3.   Facet Joint Innervation (* possible contribution)  L1-2 T12, L1 (L2*)  Medial Branch  L2-3 L1, L2 (L3*)         "          "  L3-4 L2, L3 (L4*)         "          "  L4-5 L3, L4 (L5*)         "          "  L5-S1 L4, L5, S1          "          "    Vitals:   07/07/23 1038 07/07/23 1046 07/07/23 1058 07/07/23 1106  BP: (!) 101/58 111/60 104/63 100/66  Pulse:      Resp: 17 11 18  (!) 22  Temp:  (!) 97.2 F (36.2 C)  (!) 97.1 F (36.2 C)  TempSrc:  Temporal  Temporal  SpO2: 99% 98% 96% 100%  Weight:      Height:         End Time: 1038 hrs.  Imaging Guidance (Spinal):          Type of Imaging Technique: Fluoroscopy Guidance (Spinal) Indication(s): Assistance in needle guidance and placement for procedures requiring needle placement in or near specific anatomical locations not easily accessible without such assistance. Exposure Time: Please see nurses notes. Contrast: None used. Fluoroscopic Guidance: I was personally present during the use of fluoroscopy. "Tunnel Vision Technique" used to obtain the best possible view of the target area. Parallax error corrected before commencing the procedure. "Direction-depth-direction" technique used to introduce the needle under continuous pulsed fluoroscopy. Once target was reached, antero-posterior, oblique, and lateral fluoroscopic projection used confirm needle placement in all planes. Images  permanently stored in EMR. Interpretation: No contrast injected. I personally interpreted the imaging intraoperatively. Adequate  needle placement confirmed in multiple planes. Permanent images saved into the patient's record.  Post-operative Assessment:  Post-procedure Vital Signs:  Pulse/HCG Rate: 8278 Temp: (!) 97.1 F (36.2 C) Resp: (!) 22 BP: 100/66 SpO2: 100 %  EBL: None  Complications: No immediate post-treatment complications observed by team, or reported by patient.  Note: The patient tolerated the entire procedure well. A repeat set of vitals were taken after the procedure and the patient was kept under observation following institutional policy, for this type of procedure. Post-procedural neurological assessment was performed, showing return to baseline, prior to discharge. The patient was provided with post-procedure discharge instructions, including a section on how to identify potential problems. Should any problems arise concerning this procedure, the patient was given instructions to immediately contact us, at any time, without hesitation. In any case, we plan to contact the patient by telephone for a follow-up status report regarding this interventional procedure.  Comments:  No additional relevant information.  Plan of Care (POC)  Orders:  Orders Placed This Encounter  Procedures   LUMBAR FACET(MEDIAL BRANCH NERVE BLOCK) MBNB    Scheduling Instructions:     Procedure: Lumbar facet block (AKA.: Lumbosacral medial branch nerve block)     Side: Bilateral     Level: L3-4, L4-5, L5-S1, and TBD Facets (L2, L3, L4, L5, S1, and TBD Medial Branch Nerves)     Sedation: Patient's choice.     Timeframe: Today    Order Specific Question:   Where will this procedure be performed?    Answer:   [facility]   DG [facility] C-ARM 1-60 MIN NO REPORT    Intraoperative interpretation by procedural physician at [facility].    Standing Status:   Standing    Number of Occurrences:   1    Order Specific Question:   Reason for exam:    Answer:   Assistance in needle guidance and placement for  procedures requiring needle placement in or near specific anatomical locations not easily accessible without such assistance.   Informed Consent Details: Physician/Practitioner Attestation; Transcribe to consent form and obtain patient signature    Nursing Order: Transcribe to consent form and obtain patient signature. Note: Always confirm laterality of pain with Ms. Clelia Croft, before procedure.    Order Specific Question:   Physician/Practitioner attestation of informed consent for procedure/surgical case    Answer:   I, the physician/practitioner, attest that I have discussed with the patient the benefits, risks, side effects, alternatives, likelihood of achieving goals and potential problems during recovery for the procedure that I have provided informed consent.    Order Specific Question:   Procedure    Answer:   Lumbar Facet Block  under fluoroscopic guidance    Order Specific Question:   Physician/Practitioner performing the procedure    Answer:   Donnika Kucher A. Laban Emperor MD    Order Specific Question:   Indication/Reason    Answer:   Low Back Pain, with our without leg pain, due to Facet Joint Arthralgia (Joint Pain) Spondylosis (Arthritis of the Spine), without myelopathy or radiculopathy (Nerve Damage).   Provide equipment / supplies at bedside    Procedure tray: "Block Tray" (Disposable  single use) Skin infiltration needle: Regular 1.5-in, 25-G, (x1) Block Needle type: Spinal Amount/quantity: 4 Size: Long (7-inch) Gauge: 22G    Standing Status:   Standing    Number of Occurrences:   1  Order Specific Question:   Specify    Answer:   Block Tray   Miscellanous precautions    NOTE: Although It is true that patients can have allergies to shellfish and that shellfish contain iodine, most shellfish  allergies are due to two protein allergens present in the shellfish: tropomyosins and parvalbumin. Not all patients with shellfish allergies are allergic to iodine. However, as a precaution, avoid  using iodine containing products.    Standing Status:   Standing    Number of Occurrences:   1   Chronic Opioid Analgesic:  Oxycodone IR 5 mg 1 tab PO TID (#90/mo) (15 mg/day) MME/day: 22.5 mg/day.   Medications ordered for procedure: Meds ordered this encounter  Medications   lidocaine (XYLOCAINE) 2 % (with pres) injection 400 mg   pentafluoroprop-tetrafluoroeth (GEBAUERS) aerosol   lactated ringers infusion   midazolam (VERSED) 5 MG/5ML injection 0.5-2 mg    Make sure Flumazenil is available in the pyxis when using this medication. If oversedation occurs, administer 0.2 mg IV over 15 sec. If after 45 sec no response, administer 0.2 mg again over 1 min; may repeat at 1 min intervals; not to exceed 4 doses (1 mg)   fentaNYL (SUBLIMAZE) injection 25-50 mcg    Make sure Narcan is available in the pyxis when using this medication. In the event of respiratory depression (RR< 8/min): Titrate NARCAN (naloxone) in increments of 0.1 to 0.2 mg IV at 2-3 minute intervals, until desired degree of reversal.   ropivacaine (PF) 2 mg/mL (0.2%) (NAROPIN) injection 18 mL   triamcinolone acetonide (KENALOG-40) injection 80 mg   Medications administered: We administered lidocaine, lactated ringers, midazolam, fentaNYL, ropivacaine (PF) 2 mg/mL (0.2%), and triamcinolone acetonide.  See the medical record for exact dosing, route, and time of administration.  Follow-up plan:   Return in about 2 weeks (around 07/21/2023) for (Face2F), (PPE).       Interventional Therapies  Risk  Complexity Considerations:   Estimated body mass index is 60.59 kg/m as calculated from the following:   Height as of 06/18/21: 5\' 4"  (1.626 m).   Weight as of 06/18/21: 353 lb (160.1 kg).     NOTE: NO RFA until BMI less or equal to 35.  (Gastric bypass done on 06/03/2017) Iodine allergy  Contrast dye allergy  Shellfish allergy    Planned  Pending:   Therapeutic bilateral lumbar facet MBB #9 (first of 2024)    Under  consideration:      Completed:   Therapeutic right L2-3 LESI x1 (04/29/2022) (100/100/60/60)  Therapeutic bilateral IA Monovisc knee inj. x1 (05/25/2022)  Therapeutic right trapezius TPI/MNB x1 (05/25/2022)  Therapeutic bilateral IA Zilretta knee inj. x4 (03/25/2022) (100/100/80/80)  Therapeutic bilateral IA Hyalgan knee inj. x16 (06/24/2020) (100/100/60/60)  Palliative/therapeutic bilateral lumbar facet MBB x8 (08/25/2021) (100/100/60/60)  Therapeutic right IA hip injection x3 (07/07/2017) (100/50/25/>50)  Therapeutic left IA shoulder (glenohumeral joint) injection x1 (05/31/2017) (50/0/100/100)    Completed by other providers:   Therapeutic bilateral THR (total hip replacements) (Dr. Rosita Kea) (Right: 11/27/2019) (Left: 07/29/2015)    Therapeutic  Palliative (PRN) options:   Palliative bilateral knee injections  Palliative bilateral lumbar facet MBBs    Pharmacotherapy  Nonopioid transferred 06/24/2020: Lyrica       Recent Visits Date Type Provider Dept  06/08/23 Office Visit Delano Metz, MD [facility]  Showing recent visits within past 90 days and meeting all other requirements Today's Visits Date Type Provider Dept  07/07/23 Procedure visit Delano Metz, MD [facility]  Showing today's visits and meeting all other requirements Future Appointments Date Type Provider Dept  07/28/23 Appointment Delano Metz, MD [facility]  09/12/23 Appointment Delano Metz, MD [facility]  Showing future appointments within next 90 days and meeting all other requirements  Disposition: Discharge home  Discharge (Date  Time): 07/07/2023; 1106 hrs.   Primary Care Physician: Marguarite Arbour, MD Location: [facility] Note by: Oswaldo Done, MD (TTS technology used. I apologize for any typographical errors that were not detected and corrected.) Date: 07/07/2023; Time: 11:41 AM  Disclaimer:   Medicine is not an Visual merchandiser. The only guarantee in medicine is that nothing is guaranteed. It is important to note that the decision to proceed with this intervention was based on the information collected from the patient. The Data and conclusions were drawn from the patient's questionnaire, the interview, and the physical examination. Because the information was provided in large part by the patient, it cannot be guaranteed that it has not been purposely or unconsciously manipulated. Every effort has been made to obtain as much relevant data as possible for this evaluation. It is important to note that the conclusions that lead to this procedure are derived in large part from the available data. Always take into account that the treatment will also be dependent on availability of resources and existing treatment guidelines, considered by other Pain Management Practitioners as being common knowledge and practice, at the time of the intervention. For Medico-Legal purposes, it is also important to point out that variation in procedural techniques and pharmacological choices are the acceptable norm. The indications, contraindications, technique, and results of the above procedure should only be interpreted and judged by a Board-Certified Interventional Pain Specialist with extensive familiarity and expertise in the same exact procedure and technique.

## 2023-07-08 ENCOUNTER — Telehealth: Payer: Self-pay

## 2023-07-08 DIAGNOSIS — E785 Hyperlipidemia, unspecified: Secondary | ICD-10-CM | POA: Diagnosis not present

## 2023-07-08 DIAGNOSIS — I152 Hypertension secondary to endocrine disorders: Secondary | ICD-10-CM | POA: Diagnosis not present

## 2023-07-08 DIAGNOSIS — E1159 Type 2 diabetes mellitus with other circulatory complications: Secondary | ICD-10-CM | POA: Diagnosis not present

## 2023-07-08 DIAGNOSIS — Z79899 Other long term (current) drug therapy: Secondary | ICD-10-CM | POA: Diagnosis not present

## 2023-07-08 DIAGNOSIS — E1169 Type 2 diabetes mellitus with other specified complication: Secondary | ICD-10-CM | POA: Diagnosis not present

## 2023-07-08 DIAGNOSIS — E890 Postprocedural hypothyroidism: Secondary | ICD-10-CM | POA: Diagnosis not present

## 2023-07-08 DIAGNOSIS — Z6841 Body Mass Index (BMI) 40.0 and over, adult: Secondary | ICD-10-CM | POA: Diagnosis not present

## 2023-07-08 NOTE — Telephone Encounter (Signed)
No problems post procedure. 

## 2023-07-28 ENCOUNTER — Encounter: Payer: Self-pay | Admitting: Pain Medicine

## 2023-07-28 ENCOUNTER — Ambulatory Visit: Payer: Medicare HMO | Attending: Pain Medicine | Admitting: Pain Medicine

## 2023-07-28 VITALS — BP 134/77 | HR 76 | Temp 96.8°F | Resp 22 | Ht 63.0 in | Wt 333.0 lb

## 2023-07-28 DIAGNOSIS — M5459 Other low back pain: Secondary | ICD-10-CM | POA: Diagnosis not present

## 2023-07-28 DIAGNOSIS — Z6841 Body Mass Index (BMI) 40.0 and over, adult: Secondary | ICD-10-CM | POA: Diagnosis present

## 2023-07-28 DIAGNOSIS — E66813 Obesity, class 3: Secondary | ICD-10-CM | POA: Diagnosis not present

## 2023-07-28 DIAGNOSIS — M545 Low back pain, unspecified: Secondary | ICD-10-CM | POA: Insufficient documentation

## 2023-07-28 DIAGNOSIS — R937 Abnormal findings on diagnostic imaging of other parts of musculoskeletal system: Secondary | ICD-10-CM | POA: Insufficient documentation

## 2023-07-28 DIAGNOSIS — M47816 Spondylosis without myelopathy or radiculopathy, lumbar region: Secondary | ICD-10-CM | POA: Insufficient documentation

## 2023-07-28 DIAGNOSIS — Z09 Encounter for follow-up examination after completed treatment for conditions other than malignant neoplasm: Secondary | ICD-10-CM | POA: Insufficient documentation

## 2023-07-28 DIAGNOSIS — M25561 Pain in right knee: Secondary | ICD-10-CM | POA: Diagnosis not present

## 2023-07-28 DIAGNOSIS — M25562 Pain in left knee: Secondary | ICD-10-CM | POA: Insufficient documentation

## 2023-07-28 DIAGNOSIS — G8929 Other chronic pain: Secondary | ICD-10-CM | POA: Diagnosis not present

## 2023-07-28 DIAGNOSIS — M25551 Pain in right hip: Secondary | ICD-10-CM | POA: Diagnosis not present

## 2023-07-28 DIAGNOSIS — M4316 Spondylolisthesis, lumbar region: Secondary | ICD-10-CM

## 2023-07-28 DIAGNOSIS — M4310 Spondylolisthesis, site unspecified: Secondary | ICD-10-CM | POA: Diagnosis not present

## 2023-07-28 NOTE — Progress Notes (Signed)
PROVIDER NOTE: Information contained herein reflects review and annotations entered in association with encounter. Interpretation of such information and data should be left to medically-trained personnel. Information provided to patient can be located elsewhere in the medical record under "Patient Instructions". Document created using STT-dictation technology, any transcriptional errors that may result from process are unintentional.    Patient: Karen Lewis  Service Category: E/M  Provider: Oswaldo Done, MD  DOB: 08/26/1971  DOS: 07/28/2023  Referring Provider: Marguarite Arbour, MD  MRN: [id number]  Specialty: Interventional Pain Management  PCP: Marguarite Arbour, MD  Type: Established Patient  Setting: Ambulatory outpatient    Location: Office  Delivery: Face-to-face     HPI  [name], a 52 y.o. year old female, is here today because of her Chronic bilateral low back pain without sciatica [M54.50, G89.29]. Karen Lewis primary complain today is Back Pain and Knee Pain (bilateral)  Pertinent problems: Karen Lewis has Fibromyalgia; Chronic knee pain (1ry area of Pain) (Bilateral) (R>L); Chronic low back pain (3ry area of Pain) (Bilateral) (R>L) w/o sciatica; Lumbar facet syndrome (Bilateral) (R>L); Secondary osteoarthritis of multiple sites; Grade 1  Anterolisthesis of L3/L4 & L4/L5 (1.4 cm); Chronic hip pain (2ry area of Pain) (Right); S/P THR (total hip replacement) (Left); Lumbar spondylosis; Chronic pain syndrome; Neurogenic pain; Upper extremity pain; Chronic shoulder pain (Left); Osteoarthritis of shoulder (Left); Osteoarthritis of hip (Right); Secondary Osteoarthritis of knee (Bilateral) (R>L); Spondylosis without myelopathy or radiculopathy, lumbosacral region; Panniculitis; Leg swelling; Edema of both legs; History of total hip replacement (Bilateral); S/P THR (total hip replacement) (Right) (11/27/2019); Pain and numbness of left upper extremity; Cervical radiculitis (Left); Bilateral  primary osteoarthritis of knee; DDD (degenerative disc disease), lumbosacral; Osteoarthritis of lumbar spine without myelopathy or radiculopathy; Lumbosacral radiculopathy at S1 (Left); Tricompartment osteoarthritis of knees (Bilateral); Lumbosacral radiculopathy at L2 (Right); Abnormal MRI, lumbar spine (07/01/2022); Chronic thigh pain (Right); Weakness of leg (Right); Lumbar interspinous bursitis (L2-L5); Spinal enthesopathy of lumbar region [facility]); Lumbar facet arthropathy (Multilevel) (Bilateral); Lumbar central spinal stenosis, w/o claudication (L2-3, L4-5, L5-S1); Lumbar foraminal stenosis (Bilateral: L2-3, L4-5) (L>R); Lumbosacral lateral recess stenosis (Bilateral: L4-5, L5-S1); Trigger point of right shoulder region; and Lumbar facet joint pain on their pertinent problem list. Pain Assessment: Severity of Chronic pain is reported as a 3 /10. Location: Back Lower/radiates down top of right thigh. Onset: More than a month ago. Quality: Burning, Stabbing. Timing: Constant. Modifying factor(s): meds, rest. Vitals:  height is 5\' 3"  (1.6 m) and weight is 333 lb (151 kg) (abnormal). Her temperature is 96.8 F (36 C) (abnormal). Her blood pressure is 134/77 and her pulse is 76. Her respiration is 22 (abnormal) and oxygen saturation is 94%.  BMI: Estimated body mass index is 58.99 kg/m as calculated from the following:   Height as of this encounter: 5\' 3"  (1.6 m).   Weight as of this encounter: 333 lb (151 kg). Last encounter: 06/08/2023. Last procedure: 07/07/2023.  Reason for encounter: post-procedure evaluation and assessment. The patient returns to the clinic today indicating having attained 100% relief of the pain for the duration of local anesthetic followed by an ongoing 70% improvement of her low back pain after the bilateral lumbar facet blocks.  The patient understands that the next logical step after having had a positive diagnostic confirmatory lumbar medial branch blocks is to move onto  radiofrequency ablation of those joints.  Having said that, the patient also understands that due to the fact that she has  a BMI above 35 kg/m, she is a less than ideal candidate for that radiofrequency ablation due to the technical difficulties of getting the medial branch with a long radiofrequency needles.  In addition there is the issue of increased exposure to the diagnostic fluoroscopic guidance during the procedure.  She is aware that this is my personal preference to limit my radiation exposure since we are currently being monitored by our radiology department due to high exposure levels.  For this reason she has requested that we refer her to another practice for evaluation to see if they would proceed with the radiofrequency ablation.  She has indicated that she is not wanting to leave this practice, she simply wants to see if she can get somebody to do the radiofrequency ablation for her.  I have asked my partner, Dr. Edward Jolly if he would take her case for the radiofrequency ablation, but he has reminded me that he has also been warned about his radiation exposure levels and for this reason he has turned her case down.  To assist the patient with this matter I have entered a referral to an ambulatory interventional pain management practice for them to evaluate and determine if they are interested in tackling her case.  I have informed the patient that there are no guarantees that we will be able to get somebody to assist her with this matter.  I have been managing this patient's case since well before 2016.  Around 2016 the patient is weight was 418 pounds.  Currently she is 333 pounds.  The patient did have a gastric bypass on 06/03/2017.  By now the patient has also had bilateral total hip replacements.  Post-procedure evaluation   Procedure: Lumbar Facet, Medial Branch Block(s)  #9   Laterality: Bilateral  Level: L2, L3, L4, L5, and S1 Medial Branch Level(s). Injecting these levels blocks  the L3-4, L4-5, and L5-S1 lumbar facet joints. Imaging: Fluoroscopic guidance Spinal ([id number]) Anesthesia: Local anesthesia (1-2% Lidocaine) Anxiolysis: IV Versed 3.0 mg Sedation: Moderate Sedation Fentanyl 1 mL (50 mcg) DOS: 07/07/2023 Performed by: Oswaldo Done, MD  Primary Purpose: Diagnostic/Therapeutic Indications: Low back pain severe enough to impact quality of life or function. 1. Chronic low back pain (3ry area of Pain) (Bilateral) (R>L) w/o sciatica   2. Grade 1  Anterolisthesis of L3/L4 & L4/L5 (1.4 cm)   3. Lumbar facet arthropathy (Multilevel) (Bilateral)   4. Lumbar facet syndrome (Bilateral) (R>L)   5. Lumbar facet joint pain    NAS-11 Pain score:   Pre-procedure: 5 /10   Post-procedure: 0-No pain/10   Note: Today she indicated beginning to have some of the right thigh burning sensation again, specially when she lays down at night with legs stretched.     Effectiveness:  Initial hour after procedure: 100 %. Subsequent 4-6 hours post-procedure: 100 %. Analgesia past initial 6 hours: 70 % (ongoing). Ongoing improvement:  Analgesic: The patient returns to the clinic today indicating having attained 100% relief of the pain for the duration of local anesthetic followed by an ongoing 70% improvement of her low back pain after the bilateral lumbar facet blocks. Function: Karen Lewis reports improvement in function ROM: Karen Lewis reports improvement in ROM  Pharmacotherapy Assessment  Analgesic: Oxycodone IR 5 mg 1 tab PO TID (#90/mo) (15 mg/day) MME/day: 22.5 mg/day.   Monitoring: [place] PMP: PDMP reviewed during this encounter.       Pharmacotherapy: No side-effects or adverse reactions reported. Compliance: No problems identified. Effectiveness: Clinically  acceptable.  No notes on file  No results found for: "CBDTHCR" No results found for: "D8THCCBX" No results found for: "D9THCCBX"  UDS:  Summary  Date Value Ref Range Status  06/08/2023 Note  Final     Comment:    ==================================================================== [employer] (MW) ==================================================================== Test                             Result       Flag       Units  Drug Present and Declared for Prescription Verification   7-aminoclonazepam              68           EXPECTED   ng/mg creat    7-aminoclonazepam is an expected metabolite of clonazepam. Source of    clonazepam is a scheduled prescription medication.    Oxycodone                      416          EXPECTED   ng/mg creat   Oxymorphone                    108          EXPECTED   ng/mg creat   Noroxycodone                   1904         EXPECTED   ng/mg creat   Noroxymorphone                 45           EXPECTED   ng/mg creat    Sources of oxycodone are scheduled prescription medications.    Oxymorphone, noroxycodone, and noroxymorphone are expected    metabolites of oxycodone. Oxymorphone is also available as a    scheduled prescription medication.    Butalbital                     PRESENT      EXPECTED ==================================================================== Test                      Result    Flag   Units      Ref Range   Creatinine              262              mg/dL      >=16 ==================================================================== Declared Medications:  The flagging and interpretation on this report are based on the  following declared medications.  Unexpected results may arise from  inaccuracies in the declared medications.   **Note: The testing scope of this panel includes these medications:   Butalbital (Fioricet)  Clonazepam (Klonopin)  Oxycodone   **Note: The testing scope of this panel does not include the  following reported medications:   Acetaminophen  Acetaminophen (Fioricet)  Albuterol (Ventolin HFA)  Amitriptyline (Elavil)  Biotin  Caffeine (Fioricet)  Calcium (Tums)  Diphenhydramine (Benadryl)   Duloxetine (Cymbalta)  Epinephrine (EpiPen)  Famotidine (Pepcid)  Fluticasone (Breo)  Galcanezumab (Emgality)  Indeterminate Medication  Levocetirizine (Xyzal)  Levothyroxine (Synthroid)  Lisinopril (Zestril)  Magnesium (Mag-Ox)  Meclizine (Antivert)  Metformin  Metoprolol (Lopressor)  Montelukast (Singulair)  Multivitamin  Naloxone (Narcan)  Nitrofurantoin (Macrodantin)  Omeprazole (Prilosec)  Phentermine (Adipex)  Pregabalin (Lyrica)  Rizatriptan  Rosuvastatin (Crestor)  Semaglutide (Ozempic)  Tizanidine (Zanaflex)  Topical Diclofenac (Voltaren)  Topiramate (Topamax)  Vilanterol (Breo)  Vitamin D2 ==================================================================== For clinical consultation, please call [phone]. ====================================================================       ROS  Constitutional: Denies any fever or chills Gastrointestinal: No reported hemesis, hematochezia, vomiting, or acute GI distress Musculoskeletal: Denies any acute onset joint swelling, redness, loss of ROM, or weakness Neurological: No reported episodes of acute onset apraxia, aphasia, dysarthria, agnosia, amnesia, paralysis, loss of coordination, or loss of consciousness  Medication Review  Alcohol Prep, Biotin, DULoxetine, EPINEPHrine, Galcanezumab-gnlm, Semaglutide(0.25 or 0.5MG /DOS), Tab-A-Vite, acetaminophen, acidophilus, albuterol, amitriptyline, butalbital-acetaminophen-caffeine, calcium carbonate, clonazePAM, diclofenac sodium, diphenhydrAMINE, ergocalciferol, famotidine, fluticasone furoate-vilanterol, glucose blood, levocetirizine, lisinopril, magnesium oxide, meclizine, metFORMIN, metoprolol tartrate, montelukast, naloxone, naphazoline-pheniramine, nitrofurantoin, omeprazole, oxyCODONE, phentermine, pregabalin, rizatriptan, rosuvastatin, tiZANidine, and topiramate  History Review  Allergy: Karen Lewis is allergic to bactrim [sulfamethoxazole-trimethoprim], omalizumab,  ciprofloxacin, shellfish allergy, and nsaids. Drug: Karen Lewis  reports no history of drug use. Alcohol:  reports no history of alcohol use. Tobacco:  reports that she has never smoked. She has never used smokeless tobacco. Social: Karen Lewis  reports that she has never smoked. She has never used smokeless tobacco. She reports that she does not drink alcohol and does not use drugs. Medical:  has a past medical history of Anemia, Anginal pain (HCC), Anxiety, Arthralgia of hip (07/29/2015), Arthritis, Arthritis, degenerative (07/29/2015), Asthma, Cephalalgia (07/25/2014), Dependence on unknown drug (HCC), Depression, Diabetes mellitus without complication (HCC), Difficult intubation, Dysrhythmia, Eczema, Fibromyalgia, Gastritis, GERD (gastroesophageal reflux disease), Gonalgia (07/29/2015), Gout, H/O cardiovascular disorder (03/10/2015), H/O surgical procedure (12/05/2012), H/O thyroid disease (03/10/2015), Headache, Herpes, History of artificial joint (07/29/2015), History of hiatal hernia, Hypertension, Hypomagnesemia, Hypothyroidism, IDA (iron deficiency anemia) (05/28/2019), LBP (low back pain) (07/29/2015), Neuromuscular disorder (HCC), Obesity, PCOS (polycystic ovarian syndrome), Primary osteoarthritis of both knees (07/29/2015), Sleep apnea, Thyroid nodule (bilateral), and Umbilical hernia. Surgical: Karen Lewis  has a past surgical history that includes Laparoscopic partial gastrectomy; Shoulder arthroscopy (Right); Carpal tunnel release (Bilateral); Diagnostic laparoscopy; Cholecystectomy; Trigger finger release (Right); Thyroidectomy (N/A, 11/12/2015); left trigger finger; Roux-en-Y Gastric Bypass (06/03/2017); Hiatal hernia repair; peniculectomy (N/A, 07/05/2018); Total hip arthroplasty (Right, 11/27/2019); Joint replacement (Bilateral, hip); Appendectomy; Trigger finger release (Right, 04/24/2020); Colonoscopy with propofol (N/A, 02/11/2021); and Esophagogastroduodenoscopy (egd) with propofol (N/A,  02/11/2021). Family: family history includes Alcohol abuse in her father and mother; Anxiety disorder in her father and mother; Brain cancer (age of onset: 32) in her paternal uncle; Breast cancer (age of onset: 23) in her paternal aunt and paternal aunt; COPD in her father; Depression in her brother, father, and mother; Diabetes in her brother, father, and mother; Hypertension in her brother, father, and mother; Kidney cancer in her mother; Kidney failure in her father; Post-traumatic stress disorder in her father; Sleep apnea in her brother, father, and mother.  Laboratory Chemistry Profile   Renal Lab Results  Component Value Date   BUN 17 08/09/2022   CREATININE 1.04 (H) 08/09/2022   GFRAA >60 04/16/2020   GFRNONAA >60 08/09/2022    Hepatic Lab Results  Component Value Date   AST 25 08/07/2022   ALT 40 08/07/2022   ALBUMIN 3.5 08/07/2022   ALKPHOS 74 08/07/2022   LIPASE 37 08/07/2022    Electrolytes Lab Results  Component Value Date   NA 142 08/09/2022   K 4.8 08/09/2022   CL 116 (H) 08/09/2022   CALCIUM 7.8 (L) 08/09/2022   MG 2.5 (H) 08/07/2022   PHOS 5.5 (H) 05/22/2019  Bone Lab Results  Component Value Date   VD25OH 27.4 (L) 11/24/2015   [id number] 41.5 11/24/2015    Inflammation (CRP: Acute Phase) (ESR: Chronic Phase) Lab Results  Component Value Date   CRP 0.7 09/27/2019   ESRSEDRATE 30 (H) 09/27/2019   LATICACIDVEN 1.7 08/07/2022         Note: Above Lab results reviewed.  Recent Imaging Review  DG PAIN CLINIC C-ARM 1-60 MIN NO REPORT Fluoro was used, but no Radiologist interpretation will be provided.  Please refer to "NOTES" tab for provider progress note. Note: Reviewed        Physical Exam  General appearance: Well nourished, well developed, and well hydrated. In no apparent acute distress Mental status: Alert, oriented x 3 (person, place, & time)       Respiratory: No evidence of acute respiratory distress Eyes: PERLA Vitals: BP 134/77    Pulse 76   Temp (!) 96.8 F (36 C)   Resp (!) 22   Ht 5\' 3"  (1.6 m)   Wt (!) 333 lb (151 kg)   LMP  (LMP Unknown)   SpO2 94%   BMI 58.99 kg/m  BMI: Estimated body mass index is 58.99 kg/m as calculated from the following:   Height as of this encounter: 5\' 3"  (1.6 m).   Weight as of this encounter: 333 lb (151 kg). Ideal: Ideal body weight: 52.4 kg (115 lb 8.3 oz) Adjusted ideal body weight: 91.9 kg (202 lb 8.2 oz)  Assessment   Diagnosis Status  1. Chronic low back pain (3ry area of Pain) (Bilateral) (R>L) w/o sciatica   2. Lumbar facet joint pain   3. Lumbar facet syndrome (Bilateral) (R>L)   4. Chronic knee pain (1ry area of Pain) (Bilateral) (R>L)   5. Chronic hip pain (2ry area of Pain) (Right)   6. Postop check   7. Abnormal MRI, lumbar spine (07/01/2022)   8. Grade 1  Anterolisthesis of L3/L4 & L4/L5 (1.4 cm)   9. Lumbar facet arthropathy (Multilevel) (Bilateral)   10. Class 3 severe obesity due to excess calories with serious comorbidity and body mass index (BMI) of 60.0 to 69.9 in adult [facility])    Controlled Controlled Controlled   Updated Problems: No problems updated.  Plan of Care  Problem-specific:  No problem-specific Assessment & Plan notes found for this encounter.  Karen Lewis has a current medication list which includes the following long-term medication(s): albuterol, diphenhydramine, magnesium oxide, montelukast, oxycodone, [START ON 08/18/2023] oxycodone, rizatriptan, rizatriptan, and oxycodone.  Pharmacotherapy (Medications Ordered): No orders of the defined types were placed in this encounter.  Orders:  Orders Placed This Encounter  Procedures   Ambulatory referral to Pain Clinic    Referral Priority:   Routine    Referral Type:   Consultation    Referral Reason:   Specialty Services Required    Requested Specialty:   Pain Medicine    Number of Visits Requested:   1   Nursing Instructions:    Please complete this patient's postprocedure  evaluation.    Scheduling Instructions:     Please complete this patient's postprocedure evaluation.   Follow-up plan:   Return if symptoms worsen or fail to improve.      Interventional Therapies  Risk  Complexity Considerations:   Estimated body mass index is 60.59 kg/m as calculated from the following:   Height as of 06/18/21: 5\' 4"  (1.626 m).   Weight as of 06/18/21: 353 lb (160.1 kg).  NOTE: NO RFA until BMI less or equal to 35.  (Gastric bypass done on 06/03/2017) Iodine allergy  Contrast dye allergy  Shellfish allergy    Planned  Pending:   Ambulatory referral to interventional pain management practice for evaluation and possible bilateral lumbar facet medial branch radiofrequency ablation.   Under consideration:      Completed:   Therapeutic right L2-3 LESI x1 (04/29/2022) (100/100/60/60)  Therapeutic bilateral IA Monovisc knee inj. x1 (05/25/2022)  Therapeutic right trapezius TPI/MNB x1 (05/25/2022)  Therapeutic bilateral IA Zilretta knee inj. x4 (03/25/2022) (100/100/80/80)  Therapeutic bilateral IA Hyalgan knee inj. x16 (06/24/2020) (100/100/60/60)  Palliative/therapeutic bilateral lumbar facet MBB x9 (07/07/2023) (100/100/70/70)  Therapeutic right IA hip injection x3 (07/07/2017) (100/50/25/>50)  Therapeutic left IA shoulder (glenohumeral joint) injection x1 (05/31/2017) (50/0/100/100)    Completed by other providers:   Therapeutic bilateral THR (total hip replacements) (Dr. Rosita Kea) (Right: 11/27/2019) (Left: 07/29/2015)    Therapeutic  Palliative (PRN) options:   Palliative bilateral knee injections  Palliative bilateral lumbar facet MBBs    Pharmacotherapy  Nonopioid transferred 06/24/2020: Lyrica        Recent Visits Date Type Provider Dept  07/28/23 Office Visit Delano Metz, MD [facility]  07/07/23 Procedure visit Delano Metz, MD [facility]  06/08/23 Office Visit Delano Metz, MD [facility]   Showing recent visits within past 90 days and meeting all other requirements Future Appointments Date Type Provider Dept  09/12/23 Appointment Delano Metz, MD [facility]  Showing future appointments within next 90 days and meeting all other requirements  I discussed the assessment and treatment plan with the patient. The patient was provided an opportunity to ask questions and all were answered. The patient agreed with the plan and demonstrated an understanding of the instructions.  Patient advised to call back or seek an in-person evaluation if the symptoms or condition worsens.  Duration of encounter: 35 minutes.  Total time on encounter, as per AMA guidelines included both the face-to-face and non-face-to-face time personally spent by the physician and/or other qualified health care professional(s) on the day of the encounter (includes time in activities that require the physician or other qualified health care professional and does not include time in activities normally performed by clinical staff). Physician's time may include the following activities when performed: Preparing to see the patient (e.g., pre-charting review of records, searching for previously ordered imaging, lab work, and nerve conduction tests) Review of prior analgesic pharmacotherapies. Reviewing PMP Interpreting ordered tests (e.g., lab work, imaging, nerve conduction tests) Performing post-procedure evaluations, including interpretation of diagnostic procedures Obtaining and/or reviewing separately obtained history Performing a medically appropriate examination and/or evaluation Counseling and educating the patient/family/caregiver Ordering medications, tests, or procedures Referring and communicating with other health care professionals (when not separately reported) Documenting clinical information in the electronic or other health record Independently interpreting results (not separately  reported) and communicating results to the patient/ family/caregiver Care coordination (not separately reported)  Note by: Oswaldo Done, MD Date: 07/28/2023; Time: 9:13 PM

## 2023-07-31 ENCOUNTER — Encounter: Payer: Self-pay | Admitting: Pain Medicine

## 2023-08-04 DIAGNOSIS — F331 Major depressive disorder, recurrent, moderate: Secondary | ICD-10-CM | POA: Diagnosis not present

## 2023-08-09 DIAGNOSIS — Z6841 Body Mass Index (BMI) 40.0 and over, adult: Secondary | ICD-10-CM | POA: Diagnosis not present

## 2023-08-09 DIAGNOSIS — Z713 Dietary counseling and surveillance: Secondary | ICD-10-CM | POA: Diagnosis not present

## 2023-08-09 DIAGNOSIS — Z9884 Bariatric surgery status: Secondary | ICD-10-CM | POA: Diagnosis not present

## 2023-08-16 DIAGNOSIS — Z9884 Bariatric surgery status: Secondary | ICD-10-CM | POA: Diagnosis not present

## 2023-08-24 DIAGNOSIS — Z9884 Bariatric surgery status: Secondary | ICD-10-CM | POA: Diagnosis not present

## 2023-08-24 DIAGNOSIS — Z48815 Encounter for surgical aftercare following surgery on the digestive system: Secondary | ICD-10-CM | POA: Diagnosis not present

## 2023-08-24 DIAGNOSIS — E1142 Type 2 diabetes mellitus with diabetic polyneuropathy: Secondary | ICD-10-CM | POA: Diagnosis not present

## 2023-08-31 DIAGNOSIS — G43719 Chronic migraine without aura, intractable, without status migrainosus: Secondary | ICD-10-CM | POA: Diagnosis not present

## 2023-09-11 DIAGNOSIS — M51372 Other intervertebral disc degeneration, lumbosacral region with discogenic back pain and lower extremity pain: Secondary | ICD-10-CM | POA: Insufficient documentation

## 2023-09-11 NOTE — Patient Instructions (Signed)

## 2023-09-11 NOTE — Progress Notes (Unsigned)
PROVIDER NOTE: Information contained herein reflects review and annotations entered in association with encounter. Interpretation of such information and data should be left to medically-trained personnel. Information provided to patient can be located elsewhere in the medical record under "Patient Instructions". Document created using STT-dictation technology, any transcriptional errors that may result from process are unintentional.    Patient: Karen Lewis  Service Category: E/M  Provider: Oswaldo Done, MD  DOB: 08-03-1971  DOS: 09/12/2023  Referring Provider: Marguarite Arbour, MD  MRN: [id number]  Specialty: Interventional Pain Management  PCP: Marguarite Arbour, MD  Type: Established Patient  Setting: Ambulatory outpatient    Location: Office  Delivery: Face-to-face     HPI  Karen Lewis, a 52 y.o. year old female, is here today because of her No primary diagnosis found.. Karen Lewis primary complain today is No chief complaint on file.  Pertinent problems: Karen Lewis has Fibromyalgia; Chronic knee pain (1ry area of Pain) (Bilateral) (R>L); Chronic low back pain (3ry area of Pain) (Bilateral) (R>L) w/o sciatica; Lumbar facet syndrome (Bilateral) (R>L); Secondary osteoarthritis of multiple sites; Grade 1  Anterolisthesis of L3/L4 & L4/L5 (1.4 cm); Chronic hip pain (2ry area of Pain) (Right); S/P THR (total hip replacement) (Left); Lumbar spondylosis; Chronic pain syndrome; Neurogenic pain; Upper extremity pain; Chronic shoulder pain (Left); Osteoarthritis of shoulder (Left); Osteoarthritis of hip (Right); Secondary Osteoarthritis of knee (Bilateral) (R>L); Spondylosis without myelopathy or radiculopathy, lumbosacral region; Panniculitis; Leg swelling; Edema of both legs; History of total hip replacement (Bilateral); S/P THR (total hip replacement) (Right) (11/27/2019); Pain and numbness of left upper extremity; Cervical radiculitis (Left); Bilateral primary osteoarthritis of knee; DDD  (degenerative disc disease), lumbosacral; Osteoarthritis of lumbar spine without myelopathy or radiculopathy; Lumbosacral radiculopathy at S1 (Left); Tricompartment osteoarthritis of knees (Bilateral); Lumbosacral radiculopathy at L2 (Right); Abnormal MRI, lumbar spine (07/01/2022); Chronic thigh pain (Right); Weakness of leg (Right); Lumbar interspinous bursitis (L2-L5); Spinal enthesopathy of lumbar region [facility]); Lumbar facet arthropathy (Multilevel) (Bilateral); Lumbar central spinal stenosis, w/o claudication (L2-3, L4-5, L5-S1); Lumbar foraminal stenosis (Bilateral: L2-3, L4-5) (L>R); Lumbosacral lateral recess stenosis (Bilateral: L4-5, L5-S1); Trigger point of right shoulder region; and Lumbar facet joint pain on their pertinent problem list. Pain Assessment: Severity of   is reported as a  /10. Location:    / . Onset:  . Quality:  . Timing:  . Modifying factor(s):  Marland Kitchen Vitals:  vitals were not taken for this visit.  BMI: Estimated body mass index is 58.99 kg/m as calculated from the following:   Height as of 07/28/23: 5\' 3"  (1.6 m).   Weight as of 07/28/23: 333 lb (151 kg). Last encounter: 07/28/2023. Last procedure: 07/07/2023.  Reason for encounter: medication management. ***  Discussed the use of AI scribe software for clinical note transcription with the patient, who gave verbal consent to proceed.  History of Present Illness         RTCB: 12/16/2023   Pharmacotherapy Assessment  Analgesic:  Oxycodone IR 5 mg 1 tab PO TID (#90/mo) (15 mg/day) MME/day: 22.5 mg/day.   Monitoring: [place] PMP: PDMP reviewed during this encounter.       Pharmacotherapy: No side-effects or adverse reactions reported. Compliance: No problems identified. Effectiveness: Clinically acceptable.  No notes on file  No results found for: "CBDTHCR" No results found for: "D8THCCBX" No results found for: "D9THCCBX"  UDS:  Summary  Date Value Ref Range Status  06/08/2023 Note  Final    Comment:     ====================================================================  [employer] Select 13 (MW) ==================================================================== Test                             Result       Flag       Units  Drug Present and Declared for Prescription Verification   7-aminoclonazepam              68           EXPECTED   ng/mg creat    7-aminoclonazepam is an expected metabolite of clonazepam. Source of    clonazepam is a scheduled prescription medication.    Oxycodone                      416          EXPECTED   ng/mg creat   Oxymorphone                    108          EXPECTED   ng/mg creat   Noroxycodone                   1904         EXPECTED   ng/mg creat   Noroxymorphone                 45           EXPECTED   ng/mg creat    Sources of oxycodone are scheduled prescription medications.    Oxymorphone, noroxycodone, and noroxymorphone are expected    metabolites of oxycodone. Oxymorphone is also available as a    scheduled prescription medication.    Butalbital                     PRESENT      EXPECTED ==================================================================== Test                      Result    Flag   Units      Ref Range   Creatinine              262              mg/dL      >=62 ==================================================================== Declared Medications:  The flagging and interpretation on this report are based on the  following declared medications.  Unexpected results may arise from  inaccuracies in the declared medications.   **Note: The testing scope of this panel includes these medications:   Butalbital (Fioricet)  Clonazepam (Klonopin)  Oxycodone   **Note: The testing scope of this panel does not include the  following reported medications:   Acetaminophen  Acetaminophen (Fioricet)  Albuterol (Ventolin HFA)  Amitriptyline (Elavil)  Biotin  Caffeine (Fioricet)  Calcium (Tums)  Diphenhydramine (Benadryl)  Duloxetine  (Cymbalta)  Epinephrine (EpiPen)  Famotidine (Pepcid)  Fluticasone (Breo)  Galcanezumab (Emgality)  Indeterminate Medication  Levocetirizine (Xyzal)  Levothyroxine (Synthroid)  Lisinopril (Zestril)  Magnesium (Mag-Ox)  Meclizine (Antivert)  Metformin  Metoprolol (Lopressor)  Montelukast (Singulair)  Multivitamin  Naloxone (Narcan)  Nitrofurantoin (Macrodantin)  Omeprazole (Prilosec)  Phentermine (Adipex)  Pregabalin (Lyrica)  Rizatriptan  Rosuvastatin (Crestor)  Semaglutide (Ozempic)  Tizanidine (Zanaflex)  Topical Diclofenac (Voltaren)  Topiramate (Topamax)  Vilanterol (Breo)  Vitamin D2 ==================================================================== For clinical consultation, please call [phone]. ====================================================================       ROS  Constitutional: Denies any fever or chills Gastrointestinal:  No reported hemesis, hematochezia, vomiting, or acute GI distress Musculoskeletal: Denies any acute onset joint swelling, redness, loss of ROM, or weakness Neurological: No reported episodes of acute onset apraxia, aphasia, dysarthria, agnosia, amnesia, paralysis, loss of coordination, or loss of consciousness  Medication Review  Alcohol Prep, Biotin, DULoxetine, EPINEPHrine, Galcanezumab-gnlm, Semaglutide(0.25 or 0.5MG /DOS), Tab-A-Vite, acetaminophen, acidophilus, albuterol, amitriptyline, butalbital-acetaminophen-caffeine, calcium carbonate, clonazePAM, diclofenac sodium, diphenhydrAMINE, ergocalciferol, famotidine, glucose blood, levocetirizine, lisinopril, magnesium oxide, meclizine, metFORMIN, metoprolol tartrate, montelukast, naloxone, naphazoline-pheniramine, nitrofurantoin, omeprazole, oxyCODONE, phentermine, pregabalin, rizatriptan, rosuvastatin, tiZANidine, and topiramate  History Review  Allergy: Karen Lewis is allergic to bactrim [sulfamethoxazole-trimethoprim], omalizumab, ciprofloxacin, shellfish allergy, and  nsaids. Drug: Karen Lewis  reports no history of drug use. Alcohol:  reports no history of alcohol use. Tobacco:  reports that she has never smoked. She has never used smokeless tobacco. Social: Karen Lewis  reports that she has never smoked. She has never used smokeless tobacco. She reports that she does not drink alcohol and does not use drugs. Medical:  has a past medical history of Anemia, Anginal pain (HCC), Anxiety, Arthralgia of hip (07/29/2015), Arthritis, Arthritis, degenerative (07/29/2015), Asthma, Cephalalgia (07/25/2014), Dependence on unknown drug (HCC), Depression, Diabetes mellitus without complication (HCC), Difficult intubation, Dysrhythmia, Eczema, Fibromyalgia, Gastritis, GERD (gastroesophageal reflux disease), Gonalgia (07/29/2015), Gout, H/O cardiovascular disorder (03/10/2015), H/O surgical procedure (12/05/2012), H/O thyroid disease (03/10/2015), Headache, Herpes, History of artificial joint (07/29/2015), History of hiatal hernia, Hypertension, Hypomagnesemia, Hypothyroidism, IDA (iron deficiency anemia) (05/28/2019), LBP (low back pain) (07/29/2015), Neuromuscular disorder (HCC), Obesity, PCOS (polycystic ovarian syndrome), Primary osteoarthritis of both knees (07/29/2015), Sleep apnea, Thyroid nodule (bilateral), and Umbilical hernia. Surgical: Karen Lewis  has a past surgical history that includes Laparoscopic partial gastrectomy; Shoulder arthroscopy (Right); Carpal tunnel release (Bilateral); Diagnostic laparoscopy; Cholecystectomy; Trigger finger release (Right); Thyroidectomy (N/A, 11/12/2015); left trigger finger; Roux-en-Y Gastric Bypass (06/03/2017); Hiatal hernia repair; peniculectomy (N/A, 07/05/2018); Total hip arthroplasty (Right, 11/27/2019); Joint replacement (Bilateral, hip); Appendectomy; Trigger finger release (Right, 04/24/2020); Colonoscopy with propofol (N/A, 02/11/2021); and Esophagogastroduodenoscopy (egd) with propofol (N/A, 02/11/2021). Family: family history includes Alcohol  abuse in her father and mother; Anxiety disorder in her father and mother; Brain cancer (age of onset: 10) in her paternal uncle; Breast cancer (age of onset: 16) in her paternal aunt and paternal aunt; COPD in her father; Depression in her brother, father, and mother; Diabetes in her brother, father, and mother; Hypertension in her brother, father, and mother; Kidney cancer in her mother; Kidney failure in her father; Post-traumatic stress disorder in her father; Sleep apnea in her brother, father, and mother.  Laboratory Chemistry Profile   Renal Lab Results  Component Value Date   BUN 17 08/09/2022   CREATININE 1.04 (H) 08/09/2022   GFRAA >60 04/16/2020   GFRNONAA >60 08/09/2022    Hepatic Lab Results  Component Value Date   AST 25 08/07/2022   ALT 40 08/07/2022   ALBUMIN 3.5 08/07/2022   ALKPHOS 74 08/07/2022   LIPASE 37 08/07/2022    Electrolytes Lab Results  Component Value Date   NA 142 08/09/2022   K 4.8 08/09/2022   CL 116 (H) 08/09/2022   CALCIUM 7.8 (L) 08/09/2022   MG 2.5 (H) 08/07/2022   PHOS 5.5 (H) 05/22/2019    Bone Lab Results  Component Value Date   VD25OH 27.4 (L) 11/24/2015   VD125OH2TOT 41.5 11/24/2015    Inflammation (CRP: Acute Phase) (ESR: Chronic Phase) Lab Results  Component Value Date   CRP 0.7 09/27/2019   ESRSEDRATE 30 (H)  09/27/2019   LATICACIDVEN 1.7 08/07/2022         Note: Above Lab results reviewed.  Recent Imaging Review  DG PAIN CLINIC C-ARM 1-60 MIN NO REPORT Fluoro was used, but no Radiologist interpretation will be provided.  Please refer to "NOTES" tab for provider progress note. Note: Reviewed        Physical Exam  General appearance: Well nourished, well developed, and well hydrated. In no apparent acute distress Mental status: Alert, oriented x 3 (person, place, & time)       Respiratory: No evidence of acute respiratory distress Eyes: PERLA Vitals: There were no vitals taken for this visit. BMI: Estimated body mass  index is 58.99 kg/m as calculated from the following:   Height as of 07/28/23: 5\' 3"  (1.6 m).   Weight as of 07/28/23: 333 lb (151 kg). Ideal: Patient weight not recorded  Assessment   Diagnosis Status  1. Bilateral chronic knee pain   2. Chronic hip pain, right   3. Chronic bilateral low back pain without sciatica   4. DDD (degenerative disc disease), lumbosacral   5. Facet syndrome, lumbar   6. Secondary osteoarthritis of multiple sites   7. Chronic pain syndrome   8. Pharmacologic therapy   9. Chronic use of opiate for therapeutic purpose   10. Encounter for medication management   11. Encounter for chronic pain management    Controlled Controlled Controlled   Updated Problems: No problems updated.  Plan of Care  Problem-specific:  Assessment and Plan            Karen Lewis has a current medication list which includes the following long-term medication(s): albuterol, diphenhydramine, magnesium oxide, montelukast, oxycodone, rizatriptan, and rizatriptan.  Pharmacotherapy (Medications Ordered): No orders of the defined types were placed in this encounter.  Orders:  No orders of the defined types were placed in this encounter.  Follow-up plan:   No follow-ups on file.      Interventional Therapies  Risk  Complexity Considerations:   Estimated body mass index is 60.59 kg/m as calculated from the following:   Height as of 06/18/21: 5\' 4"  (1.626 m).   Weight as of 06/18/21: 353 lb (160.1 kg).     NOTE: NO RFA until BMI less or equal to 35.  (Gastric bypass done on 06/03/2017) Iodine allergy  Contrast dye allergy  Shellfish allergy    Planned  Pending:   Ambulatory referral to interventional pain management practice for evaluation and possible bilateral lumbar facet medial branch radiofrequency ablation.   Under consideration:      Completed:   Therapeutic right L2-3 LESI x1 (04/29/2022) (100/100/60/60)  Therapeutic bilateral IA Monovisc knee inj. x1  (05/25/2022)  Therapeutic right trapezius TPI/MNB x1 (05/25/2022)  Therapeutic bilateral IA Zilretta knee inj. x4 (03/25/2022) (100/100/80/80)  Therapeutic bilateral IA Hyalgan knee inj. x16 (06/24/2020) (100/100/60/60)  Palliative/therapeutic bilateral lumbar facet MBB x9 (07/07/2023) (100/100/70/70)  Therapeutic right IA hip injection x3 (07/07/2017) (100/50/25/>50)  Therapeutic left IA shoulder (glenohumeral joint) injection x1 (05/31/2017) (50/0/100/100)    Completed by other providers:   Therapeutic bilateral THR (total hip replacements) (Dr. Rosita Kea) (Right: 11/27/2019) (Left: 07/29/2015)    Therapeutic  Palliative (PRN) options:   Palliative bilateral knee injections  Palliative bilateral lumbar facet MBBs    Pharmacotherapy  Nonopioid transferred 06/24/2020: Lyrica        Recent Visits Date Type Provider Dept  07/28/23 Office Visit Delano Metz, MD [facility]  07/07/23 Procedure visit Delano Metz, MD  [facility]  Showing recent visits within past 90 days and meeting all other requirements Future Appointments Date Type Provider Dept  09/12/23 Appointment Delano Metz, MD [facility]  Showing future appointments within next 90 days and meeting all other requirements  I discussed the assessment and treatment plan with the patient. The patient was provided an opportunity to ask questions and all were answered. The patient agreed with the plan and demonstrated an understanding of the instructions.  Patient advised to call back or seek an in-person evaluation if the symptoms or condition worsens.  Duration of encounter: *** minutes.  Total time on encounter, as per AMA guidelines included both the face-to-face and non-face-to-face time personally spent by the physician and/or other qualified health care professional(s) on the day of the encounter (includes time in activities that require the physician or other qualified health care  professional and does not include time in activities normally performed by clinical staff). Physician's time may include the following activities when performed: Preparing to see the patient (e.g., pre-charting review of records, searching for previously ordered imaging, lab work, and nerve conduction tests) Review of prior analgesic pharmacotherapies. Reviewing PMP Interpreting ordered tests (e.g., lab work, imaging, nerve conduction tests) Performing post-procedure evaluations, including interpretation of diagnostic procedures Obtaining and/or reviewing separately obtained history Performing a medically appropriate examination and/or evaluation Counseling and educating the patient/family/caregiver Ordering medications, tests, or procedures Referring and communicating with other health care professionals (when not separately reported) Documenting clinical information in the electronic or other health record Independently interpreting results (not separately reported) and communicating results to the patient/ family/caregiver Care coordination (not separately reported)  Note by: Oswaldo Done, MD Date: 09/12/2023; Time: 4:53 PM

## 2023-09-12 ENCOUNTER — Ambulatory Visit (HOSPITAL_BASED_OUTPATIENT_CLINIC_OR_DEPARTMENT_OTHER): Payer: Medicare HMO | Admitting: Pain Medicine

## 2023-09-12 DIAGNOSIS — M51372 Other intervertebral disc degeneration, lumbosacral region with discogenic back pain and lower extremity pain: Secondary | ICD-10-CM

## 2023-09-12 DIAGNOSIS — G894 Chronic pain syndrome: Secondary | ICD-10-CM

## 2023-09-12 DIAGNOSIS — Z79899 Other long term (current) drug therapy: Secondary | ICD-10-CM

## 2023-09-12 DIAGNOSIS — Z79891 Long term (current) use of opiate analgesic: Secondary | ICD-10-CM

## 2023-09-12 DIAGNOSIS — M153 Secondary multiple arthritis: Secondary | ICD-10-CM

## 2023-09-12 DIAGNOSIS — G8929 Other chronic pain: Secondary | ICD-10-CM

## 2023-09-12 DIAGNOSIS — M545 Low back pain, unspecified: Secondary | ICD-10-CM

## 2023-09-12 DIAGNOSIS — M25551 Pain in right hip: Secondary | ICD-10-CM

## 2023-09-12 DIAGNOSIS — M4310 Spondylolisthesis, site unspecified: Secondary | ICD-10-CM

## 2023-09-12 DIAGNOSIS — Z91199 Patient's noncompliance with other medical treatment and regimen due to unspecified reason: Secondary | ICD-10-CM

## 2023-09-15 DIAGNOSIS — F331 Major depressive disorder, recurrent, moderate: Secondary | ICD-10-CM | POA: Diagnosis not present

## 2023-09-15 DIAGNOSIS — F39 Unspecified mood [affective] disorder: Secondary | ICD-10-CM | POA: Diagnosis not present

## 2023-09-19 ENCOUNTER — Ambulatory Visit (INDEPENDENT_AMBULATORY_CARE_PROVIDER_SITE_OTHER): Payer: Medicare HMO | Admitting: Nurse Practitioner

## 2023-09-25 NOTE — Patient Instructions (Signed)

## 2023-09-25 NOTE — Progress Notes (Signed)
 PROVIDER NOTE: Information contained herein reflects review and annotations entered in association with encounter. Interpretation of such information and data should be left to medically-trained personnel. Information provided to patient can be located elsewhere in the medical record under Patient Instructions. Document created using STT-dictation technology, any transcriptional errors that may result from process are unintentional.    Patient: Karen Lewis  Service Category: E/M  Provider: Eric DELENA Como, MD  DOB: 1970-11-29  DOS: 09/26/2023  Referring Provider: [name], MD  MRN: [id number]  Specialty: Interventional Pain Management  PCP: [name], MD  Type: Established Patient  Setting: Ambulatory outpatient    Location: Office  Delivery: Face-to-face     HPI  [name], a 53 y.o. year old female, is here today because of her No primary diagnosis found.. Ms. Riendeau primary complain today is Back Pain and Knee Pain  Pertinent problems: Ms. Sias has Fibromyalgia; Chronic knee pain (1ry area of Pain) (Bilateral) (R>L); Chronic low back pain (3ry area of Pain) (Bilateral) (R>L) w/o sciatica; Lumbar facet syndrome (Bilateral) (R>L); Secondary osteoarthritis of multiple sites; Grade 1  Anterolisthesis of L3/L4 & L4/L5 (1.4 cm); Chronic hip pain s/p THR (2ry area of pain) (Bilateral); S/P THR (total hip replacement) (Left); Lumbar spondylosis; Chronic pain syndrome; Neurogenic pain; Upper extremity pain; Chronic shoulder pain (Left); Osteoarthritis of shoulder (Left); Osteoarthritis of hip (Right); Secondary Osteoarthritis of knee (Bilateral) (R>L); Spondylosis without myelopathy or radiculopathy, lumbosacral region; Panniculitis; Leg swelling; Edema of both legs; History of total hip replacement (Bilateral); S/P THR (total hip replacement) (Right) (11/27/2019); Pain and numbness of left upper extremity; Cervical radiculitis (Left); Bilateral primary osteoarthritis of knee;  Osteoarthritis of lumbar spine without myelopathy or radiculopathy; Lumbosacral radiculopathy at S1 (Left); Tricompartment osteoarthritis of knees (Bilateral); Lumbosacral radiculopathy at L2 (Right); Abnormal MRI, lumbar spine (07/01/2022); Chronic thigh pain (Right); Weakness of leg (Right); Lumbar interspinous bursitis (L2-L5); Spinal enthesopathy of lumbar region [facility]); Lumbar facet arthropathy (Multilevel) (Bilateral); Lumbar central spinal stenosis, w/o claudication (L2-3, L4-5, L5-S1); Lumbar foraminal stenosis (Bilateral: L2-3, L4-5) (L>R); Lumbosacral lateral recess stenosis (Bilateral: L4-5, L5-S1); Trigger point of right shoulder region; and Lumbar facet joint pain on their pertinent problem list. Pain Assessment: Severity of Chronic pain is reported as a 4 /10. Location: Back Lower, Right, Left/Radaites from lower back to entire legs down to ankle bilateral. Onset: More than a month ago. Quality: Constant, Numbness, Aching, Dull, Burning. Timing: Constant. Modifying factor(s): Pain medication, position, heat, ice, PT and rest. Vitals:  height is 5' 3 (1.6 m) and weight is 330 lb (149.7 kg) (abnormal). Her temporal temperature is 98.3 F (36.8 C). Her blood pressure is 129/82 and her pulse is 88. Her respiration is 16.  BMI: Estimated body mass index is 58.46 kg/m as calculated from the following:   Height as of this encounter: 5' 3 (1.6 m).   Weight as of this encounter: 330 lb (149.7 kg). Last encounter: 09/12/2023. Last procedure: 07/07/2023.  Reason for encounter: medication management.  The patient indicates doing well with the current medication regimen. No adverse reactions or side effects reported to the medications.   Discussed the use of AI scribe software for clinical note transcription with the patient, who gave verbal consent to proceed.  History of Present Illness   The patient reports no adverse reactions or side effects from her current medication regimen. She was  expecting a referral to a pain clinic for potential radiofrequency treatment, but she did not receive any communication  regarding this. The patient was unaware of the specific clinic the referral was intended for. No new symptoms or changes in health status were reported during this visit. The patient's pharmacy remains the same. The patient's primary concern during this visit was to clarify the status of the referral and to ensure its proper processing.     RTCB: 12/25/2023   Pharmacotherapy Assessment  Analgesic: Oxycodone  IR 5 mg 1 tab PO TID (#90/mo) (15 mg/day) MME/day: 22.5 mg/day.   Monitoring: [place] PMP: PDMP reviewed during this encounter.       Pharmacotherapy: No side-effects or adverse reactions reported. Compliance: No problems identified. Effectiveness: Clinically acceptable.  Bonner Norris, RN  09/26/2023  9:07 AM  Sign when Signing Visit Nursing Pain Medication Assessment:  Safety precautions to be maintained throughout the outpatient stay will include: orient to surroundings, keep bed in low position, maintain call bell within reach at all times, provide assistance with transfer out of bed and ambulation.  Medication Inspection Compliance: Pill count conducted under aseptic conditions, in front of the patient. Neither the pills nor the bottle was removed from the patient's sight at any time. Once count was completed pills were immediately returned to the patient in their original bottle.  Medication: Oxycodone  IR Pill/Patch Count:  47 of 90 pills remain Pill/Patch Appearance: Markings consistent with prescribed medication Bottle Appearance: Standard pharmacy container. Clearly labeled. Filled Date: 65 / 18 / 2024 Last Medication intake:  Today    No results found for: CBDTHCR No results found for: D8THCCBX No results found for: D9THCCBX  UDS:  Summary  Date Value Ref Range Status  06/08/2023 Note  Final    Comment:     ==================================================================== [employer] Select 13 (MW) ==================================================================== Test                             Result       Flag       Units  Drug Present and Declared for Prescription Verification   7-aminoclonazepam              68           EXPECTED   ng/mg creat    7-aminoclonazepam is an expected metabolite of clonazepam . Source of    clonazepam  is a scheduled prescription medication.    Oxycodone                       416          EXPECTED   ng/mg creat   Oxymorphone                    108          EXPECTED   ng/mg creat   Noroxycodone                   1904         EXPECTED   ng/mg creat   Noroxymorphone                 45           EXPECTED   ng/mg creat    Sources of oxycodone  are scheduled prescription medications.    Oxymorphone, noroxycodone, and noroxymorphone are expected    metabolites of oxycodone . Oxymorphone is also available as a    scheduled prescription medication.    Butalbital   PRESENT      EXPECTED ==================================================================== Test                      Result    Flag   Units      Ref Range   Creatinine              262              mg/dL      >=79 ==================================================================== Declared Medications:  The flagging and interpretation on this report are based on the  following declared medications.  Unexpected results may arise from  inaccuracies in the declared medications.   **Note: The testing scope of this panel includes these medications:   Butalbital  (Fioricet )  Clonazepam  (Klonopin )  Oxycodone    **Note: The testing scope of this panel does not include the  following reported medications:   Acetaminophen   Acetaminophen  (Fioricet )  Albuterol  (Ventolin  HFA)  Amitriptyline  (Elavil )  Biotin   Caffeine  (Fioricet )  Calcium  (Tums)  Diphenhydramine  (Benadryl )  Duloxetine   (Cymbalta )  Epinephrine  (EpiPen )  Famotidine  (Pepcid )  Fluticasone  (Breo)  Galcanezumab (Emgality)  Indeterminate Medication  Levocetirizine (Xyzal )  Levothyroxine  (Synthroid )  Lisinopril  (Zestril )  Magnesium  (Mag-Ox)  Meclizine  (Antivert )  Metformin   Metoprolol  (Lopressor )  Montelukast  (Singulair )  Multivitamin  Naloxone  (Narcan )  Nitrofurantoin  (Macrodantin )  Omeprazole (Prilosec)  Phentermine (Adipex)  Pregabalin  (Lyrica )  Rizatriptan  Rosuvastatin  (Crestor )  Semaglutide (Ozempic)  Tizanidine  (Zanaflex )  Topical Diclofenac  (Voltaren )  Topiramate  (Topamax )  Vilanterol (Breo)  Vitamin D2 ==================================================================== For clinical consultation, please call [phone]. ====================================================================       ROS  Constitutional: Denies any fever or chills Gastrointestinal: No reported hemesis, hematochezia, vomiting, or acute GI distress Musculoskeletal: Denies any acute onset joint swelling, redness, loss of ROM, or weakness Neurological: No reported episodes of acute onset apraxia, aphasia, dysarthria, agnosia, amnesia, paralysis, loss of coordination, or loss of consciousness  Medication Review  Alcohol  Prep, Biotin , DULoxetine , EPINEPHrine , Galcanezumab-gnlm, QUEtiapine , Semaglutide(0.25 or 0.5MG /DOS), Tab-A-Vite, acetaminophen , acidophilus, albuterol , amitriptyline , butalbital -acetaminophen -caffeine , calcium  carbonate, clonazePAM , diclofenac  sodium, diphenhydrAMINE , ergocalciferol , famotidine , glucose blood, levocetirizine, lisinopril , magnesium  oxide, meclizine , metFORMIN , metoprolol  tartrate, montelukast , naloxone , naphazoline-pheniramine, nitrofurantoin , omeprazole, oxyCODONE , phentermine, pregabalin , rizatriptan, rosuvastatin , tiZANidine , and topiramate   History Review  Allergy: Ms. Hertzog is allergic to bactrim [sulfamethoxazole-trimethoprim], omalizumab, ciprofloxacin, shellfish allergy,  and nsaids. Drug: Ms. Shiflett  reports no history of drug use. Alcohol :  reports no history of alcohol  use. Tobacco:  reports that she has never smoked. She has never used smokeless tobacco. Social: Ms. Nieland  reports that she has never smoked. She has never used smokeless tobacco. She reports that she does not drink alcohol  and does not use drugs. Medical:  has a past medical history of Anemia, Anginal pain (HCC), Anxiety, Arthralgia of hip (07/29/2015), Arthritis, Arthritis, degenerative (07/29/2015), Asthma, Cephalalgia (07/25/2014), Dependence on unknown drug (HCC), Depression, Diabetes mellitus without complication (HCC), Difficult intubation, Dysrhythmia, Eczema, Fibromyalgia, Gastritis, GERD (gastroesophageal reflux disease), Gonalgia (07/29/2015), Gout, H/O cardiovascular disorder (03/10/2015), H/O surgical procedure (12/05/2012), H/O thyroid  disease (03/10/2015), Headache, Herpes, History of artificial joint (07/29/2015), History of hiatal hernia, Hypertension, Hypomagnesemia, Hypothyroidism, IDA (iron  deficiency anemia) (05/28/2019), LBP (low back pain) (07/29/2015), Neuromuscular disorder (HCC), Obesity, PCOS (polycystic ovarian syndrome), Primary osteoarthritis of both knees (07/29/2015), Sleep apnea, Thyroid  nodule (bilateral), and Umbilical hernia. Surgical: Ms. Condron  has a past surgical history that includes Laparoscopic partial gastrectomy; Shoulder arthroscopy (Right); Carpal tunnel release (Bilateral); Diagnostic laparoscopy; Cholecystectomy; Trigger finger release (Right); Thyroidectomy (N/A, 11/12/2015); left trigger finger; Roux-en-Y Gastric Bypass (06/03/2017); Hiatal  hernia repair; peniculectomy (N/A, 07/05/2018); Total hip arthroplasty (Right, 11/27/2019); Joint replacement (Bilateral, hip); Appendectomy; Trigger finger release (Right, 04/24/2020); Colonoscopy with propofol  (N/A, 02/11/2021); and Esophagogastroduodenoscopy (egd) with propofol  (N/A, 02/11/2021). Family: family history includes  Alcohol  abuse in her father and mother; Anxiety disorder in her father and mother; Brain cancer (age of onset: 46) in her paternal uncle; Breast cancer (age of onset: 38) in her paternal aunt and paternal aunt; COPD in her father; Depression in her brother, father, and mother; Diabetes in her brother, father, and mother; Hypertension in her brother, father, and mother; Kidney cancer in her mother; Kidney failure in her father; Post-traumatic stress disorder in her father; Sleep apnea in her brother, father, and mother.  Laboratory Chemistry Profile   Renal Lab Results  Component Value Date   BUN 17 08/09/2022   CREATININE 1.04 (H) 08/09/2022   GFRAA >60 04/16/2020   GFRNONAA >60 08/09/2022    Hepatic Lab Results  Component Value Date   AST 25 08/07/2022   ALT 40 08/07/2022   ALBUMIN 3.5 08/07/2022   ALKPHOS 74 08/07/2022   LIPASE 37 08/07/2022    Electrolytes Lab Results  Component Value Date   NA 142 08/09/2022   K 4.8 08/09/2022   CL 116 (H) 08/09/2022   CALCIUM  7.8 (L) 08/09/2022   MG 2.5 (H) 08/07/2022   PHOS 5.5 (H) 05/22/2019    Bone Lab Results  Component Value Date   VD25OH 27.4 (L) 11/24/2015   VD125OH2TOT 41.5 11/24/2015    Inflammation (CRP: Acute Phase) (ESR: Chronic Phase) Lab Results  Component Value Date   CRP 0.7 09/27/2019   ESRSEDRATE 30 (H) 09/27/2019   LATICACIDVEN 1.7 08/07/2022         Note: Above Lab results reviewed.  Recent Imaging Review  DG PAIN CLINIC C-ARM 1-60 MIN NO REPORT Fluoro was used, but no Radiologist interpretation will be provided.  Please refer to NOTES tab for provider progress note. Note: Reviewed        Physical Exam  General appearance: Well nourished, well developed, and well hydrated. In no apparent acute distress Mental status: Alert, oriented x 3 (person, place, & time)       Respiratory: No evidence of acute respiratory distress Eyes: PERLA Vitals: BP 129/82 (Patient Position: Sitting)   Pulse 88   Temp  98.3 F (36.8 C) (Temporal)   Resp 16   Ht 5' 3 (1.6 m)   Wt (!) 330 lb (149.7 kg)   BMI 58.46 kg/m  BMI: Estimated body mass index is 58.46 kg/m as calculated from the following:   Height as of this encounter: 5' 3 (1.6 m).   Weight as of this encounter: 330 lb (149.7 kg). Ideal: Ideal body weight: 52.4 kg (115 lb 8.3 oz) Adjusted ideal body weight: 91.3 kg (201 lb 5 oz)  Assessment   Diagnosis Status  1. Bilateral chronic knee pain   2. Chronic hip pain, right   3. Chronic bilateral low back pain without sciatica   4. Degeneration of intervertebral disc of lumbosacral region with discogenic back pain   5. Facet syndrome, lumbar   6. Secondary osteoarthritis of multiple sites   7. Chronic pain syndrome   8. Pharmacologic therapy   9. Chronic use of opiate for therapeutic purpose   10. Encounter for medication management   11. Encounter for chronic pain management    Controlled Controlled Controlled   Updated Problems: No problems updated.  Plan of Care  Problem-specific:  Assessment and  Plan    Chronic Pain Management Previously referred for potential radiofrequency ablation therapy but did not receive the referral. No adverse reactions to current medications. Follow up with the front desk regarding the pain clinic referral. Ensure the referral to the pain clinic is processed and communicated.       Ms. MAKALIA BARE has a current medication list which includes the following long-term medication(s): albuterol , diphenhydramine , magnesium  oxide, montelukast , naloxone , oxycodone , [START ON 10/26/2023] oxycodone , [START ON 11/25/2023] oxycodone , rizatriptan, and rizatriptan.  Pharmacotherapy (Medications Ordered): Meds ordered this encounter  Medications   oxyCODONE  (OXY IR/ROXICODONE ) 5 MG immediate release tablet    Sig: Take 1 tablet (5 mg total) by mouth every 8 (eight) hours as needed for severe pain (pain score 7-10). Must last 30 days    Dispense:  90 tablet     Refill:  0    Not a duplicate. Do NOT delete! Dispense 1 day early if closed on refill date. Avoid benzodiazepines within 8 hours of opioids. Do not send refill requests.   naloxone  (NARCAN ) nasal spray 4 mg/0.1 mL    Sig: Place 1 spray into the nose as needed for up to 365 doses (for opioid-induced respiratory depresssion). In case of emergency (overdose), spray once into each nostril. If no response within 3 minutes, repeat application and call 911.    Dispense:  1 each    Refill:  1    Instruct patient in proper use of device.   oxyCODONE  (OXY IR/ROXICODONE ) 5 MG immediate release tablet    Sig: Take 1 tablet (5 mg total) by mouth every 8 (eight) hours as needed for severe pain (pain score 7-10). Must last 30 days    Dispense:  90 tablet    Refill:  0    Not a duplicate. Do NOT delete! Dispense 1 day early if closed on refill date. Avoid benzodiazepines within 8 hours of opioids. Do not send refill requests.   oxyCODONE  (OXY IR/ROXICODONE ) 5 MG immediate release tablet    Sig: Take 1 tablet (5 mg total) by mouth every 8 (eight) hours as needed for severe pain (pain score 7-10). Must last 30 days    Dispense:  90 tablet    Refill:  0    Not a duplicate. Do NOT delete! Dispense 1 day early if closed on refill date. Avoid benzodiazepines within 8 hours of opioids. Do not send refill requests.   Orders:  No orders of the defined types were placed in this encounter.  Follow-up plan:   Return in about 3 months (around 12/25/2023) for Eval-day (M,W), (F2F), (MM).      Interventional Therapies  Risk  Complexity Considerations:   Estimated body mass index is 60.59 kg/m as calculated from the following:   Height as of 06/18/21: 5' 4 (1.626 m).   Weight as of 06/18/21: 353 lb (160.1 kg).     NOTE: NO RFA until BMI less or equal to 35.  (Gastric bypass done on 06/03/2017) Iodine allergy  Contrast dye allergy  Shellfish allergy    Planned  Pending:   Ambulatory referral to interventional  pain management practice for evaluation and possible bilateral lumbar facet medial branch radiofrequency ablation.  (Entered on 07/28/2023)   Under consideration:      Completed:   Therapeutic right L2-3 LESI x1 (04/29/2022) (100/100/60/60)  Therapeutic bilateral IA Monovisc knee inj. x1 (05/25/2022)  Therapeutic right trapezius TPI/MNB x1 (05/25/2022)  Therapeutic bilateral IA Zilretta  knee inj. x4 (03/25/2022) (100/100/80/80)  Therapeutic  bilateral IA Hyalgan knee inj. x16 (06/24/2020) (100/100/60/60)  Palliative/therapeutic bilateral lumbar facet MBB x9 (07/07/2023) (100/100/70/70)  Therapeutic right IA hip injection x3 (07/07/2017) (100/50/25/>50)  Therapeutic left IA shoulder (glenohumeral joint) injection x1 (05/31/2017) (50/0/100/100)    Completed by other providers:   Therapeutic bilateral THR (total hip replacements) (Dr. Kathlynn) (Right: 11/27/2019) (Left: 07/29/2015)    Therapeutic  Palliative (PRN) options:   Palliative bilateral knee injections  Palliative bilateral lumbar facet MBBs    Pharmacotherapy  Nonopioid transferred 06/24/2020: Lyrica        Recent Visits Date Type Provider Dept  07/28/23 Office Visit Tanya Glisson, MD [facility]  07/07/23 Procedure visit Tanya Glisson, MD [facility]  Showing recent visits within past 90 days and meeting all other requirements Today's Visits Date Type Provider Dept  09/26/23 Office Visit Tanya Glisson, MD [facility]  Showing today's visits and meeting all other requirements Future Appointments Date Type Provider Dept  12/21/23 Appointment Tanya Glisson, MD [facility]  Showing future appointments within next 90 days and meeting all other requirements  I discussed the assessment and treatment plan with the patient. The patient was provided an opportunity to ask questions and all were answered. The patient agreed with the plan and demonstrated an understanding of the  instructions.  Patient advised to call back or seek an in-person evaluation if the symptoms or condition worsens.  Duration of encounter: 30 minutes.  Total time on encounter, as per AMA guidelines included both the face-to-face and non-face-to-face time personally spent by the physician and/or other qualified health care professional(s) on the day of the encounter (includes time in activities that require the physician or other qualified health care professional and does not include time in activities normally performed by clinical staff). Physician's time may include the following activities when performed: Preparing to see the patient (e.g., pre-charting review of records, searching for previously ordered imaging, lab work, and nerve conduction tests) Review of prior analgesic pharmacotherapies. Reviewing PMP Interpreting ordered tests (e.g., lab work, imaging, nerve conduction tests) Performing post-procedure evaluations, including interpretation of diagnostic procedures Obtaining and/or reviewing separately obtained history Performing a medically appropriate examination and/or evaluation Counseling and educating the patient/family/caregiver Ordering medications, tests, or procedures Referring and communicating with other health care professionals (when not separately reported) Documenting clinical information in the electronic or other health record Independently interpreting results (not separately reported) and communicating results to the patient/ family/caregiver Care coordination (not separately reported)  Note by: Glisson DELENA Tanya, MD Date: 09/26/2023; Time: 9:49 AM

## 2023-09-26 ENCOUNTER — Ambulatory Visit: Payer: Medicare HMO | Attending: Pain Medicine | Admitting: Pain Medicine

## 2023-09-26 ENCOUNTER — Encounter: Payer: Self-pay | Admitting: Pain Medicine

## 2023-09-26 DIAGNOSIS — M25551 Pain in right hip: Secondary | ICD-10-CM | POA: Diagnosis not present

## 2023-09-26 DIAGNOSIS — M25561 Pain in right knee: Secondary | ICD-10-CM | POA: Insufficient documentation

## 2023-09-26 DIAGNOSIS — M153 Secondary multiple arthritis: Secondary | ICD-10-CM

## 2023-09-26 DIAGNOSIS — M545 Low back pain, unspecified: Secondary | ICD-10-CM | POA: Diagnosis not present

## 2023-09-26 DIAGNOSIS — Z79899 Other long term (current) drug therapy: Secondary | ICD-10-CM

## 2023-09-26 DIAGNOSIS — G894 Chronic pain syndrome: Secondary | ICD-10-CM | POA: Diagnosis not present

## 2023-09-26 DIAGNOSIS — M51379 Other intervertebral disc degeneration, lumbosacral region without mention of lumbar back pain or lower extremity pain: Secondary | ICD-10-CM

## 2023-09-26 DIAGNOSIS — Z79891 Long term (current) use of opiate analgesic: Secondary | ICD-10-CM

## 2023-09-26 DIAGNOSIS — M25562 Pain in left knee: Secondary | ICD-10-CM | POA: Diagnosis not present

## 2023-09-26 DIAGNOSIS — G8929 Other chronic pain: Secondary | ICD-10-CM | POA: Diagnosis not present

## 2023-09-26 DIAGNOSIS — M47816 Spondylosis without myelopathy or radiculopathy, lumbar region: Secondary | ICD-10-CM | POA: Diagnosis not present

## 2023-09-26 DIAGNOSIS — M5137 Other intervertebral disc degeneration, lumbosacral region with discogenic back pain only: Secondary | ICD-10-CM

## 2023-09-26 MED ORDER — NALOXONE HCL 4 MG/0.1ML NA LIQD: 1.0000 | NASAL | 1 refills | Status: AC | PRN

## 2023-09-26 MED ORDER — OXYCODONE HCL 5 MG PO TABS: 5.0000 mg | ORAL_TABLET | Freq: Three times a day (TID) | ORAL | 0 refills | Status: DC | PRN

## 2023-09-26 NOTE — Progress Notes (Signed)
 Nursing Pain Medication Assessment:  Safety precautions to be maintained throughout the outpatient stay will include: orient to surroundings, keep bed in low position, maintain call bell within reach at all times, provide assistance with transfer out of bed and ambulation.  Medication Inspection Compliance: Pill count conducted under aseptic conditions, in front of the patient. Neither the pills nor the bottle was removed from the patient's sight at any time. Once count was completed pills were immediately returned to the patient in their original bottle.  Medication: Oxycodone  IR Pill/Patch Count:  47 of 90 pills remain Pill/Patch Appearance: Markings consistent with prescribed medication Bottle Appearance: Standard pharmacy container. Clearly labeled. Filled Date: 83 / 18 / 2024 Last Medication intake:  Today

## 2023-10-05 DIAGNOSIS — F331 Major depressive disorder, recurrent, moderate: Secondary | ICD-10-CM | POA: Diagnosis not present

## 2023-10-25 DIAGNOSIS — M25462 Effusion, left knee: Secondary | ICD-10-CM | POA: Diagnosis not present

## 2023-10-25 DIAGNOSIS — M25461 Effusion, right knee: Secondary | ICD-10-CM | POA: Diagnosis not present

## 2023-10-25 DIAGNOSIS — M170 Bilateral primary osteoarthritis of knee: Secondary | ICD-10-CM | POA: Diagnosis not present

## 2023-10-25 DIAGNOSIS — Z6841 Body Mass Index (BMI) 40.0 and over, adult: Secondary | ICD-10-CM | POA: Diagnosis not present

## 2023-10-25 DIAGNOSIS — G8929 Other chronic pain: Secondary | ICD-10-CM | POA: Diagnosis not present

## 2023-10-26 DIAGNOSIS — F331 Major depressive disorder, recurrent, moderate: Secondary | ICD-10-CM | POA: Diagnosis not present

## 2023-10-28 DIAGNOSIS — F331 Major depressive disorder, recurrent, moderate: Secondary | ICD-10-CM | POA: Diagnosis not present

## 2023-10-28 DIAGNOSIS — F4312 Post-traumatic stress disorder, chronic: Secondary | ICD-10-CM | POA: Diagnosis not present

## 2023-10-28 DIAGNOSIS — F39 Unspecified mood [affective] disorder: Secondary | ICD-10-CM | POA: Diagnosis not present

## 2023-11-01 DIAGNOSIS — R300 Dysuria: Secondary | ICD-10-CM | POA: Diagnosis not present

## 2023-11-01 DIAGNOSIS — R8290 Unspecified abnormal findings in urine: Secondary | ICD-10-CM | POA: Diagnosis not present

## 2023-11-01 DIAGNOSIS — J4551 Severe persistent asthma with (acute) exacerbation: Secondary | ICD-10-CM | POA: Diagnosis not present

## 2023-11-04 DIAGNOSIS — F331 Major depressive disorder, recurrent, moderate: Secondary | ICD-10-CM | POA: Diagnosis not present

## 2023-11-09 DIAGNOSIS — F331 Major depressive disorder, recurrent, moderate: Secondary | ICD-10-CM | POA: Diagnosis not present

## 2023-11-15 ENCOUNTER — Encounter (INDEPENDENT_AMBULATORY_CARE_PROVIDER_SITE_OTHER): Payer: Self-pay | Admitting: Nurse Practitioner

## 2023-11-15 ENCOUNTER — Ambulatory Visit (INDEPENDENT_AMBULATORY_CARE_PROVIDER_SITE_OTHER): Payer: Medicare HMO | Admitting: Nurse Practitioner

## 2023-11-15 VITALS — BP 118/76 | HR 85 | Resp 18 | Ht 63.0 in | Wt 330.0 lb

## 2023-11-15 DIAGNOSIS — E119 Type 2 diabetes mellitus without complications: Secondary | ICD-10-CM

## 2023-11-15 DIAGNOSIS — J4540 Moderate persistent asthma, uncomplicated: Secondary | ICD-10-CM | POA: Diagnosis not present

## 2023-11-15 DIAGNOSIS — E1159 Type 2 diabetes mellitus with other circulatory complications: Secondary | ICD-10-CM | POA: Diagnosis not present

## 2023-11-15 DIAGNOSIS — K2100 Gastro-esophageal reflux disease with esophagitis, without bleeding: Secondary | ICD-10-CM | POA: Diagnosis not present

## 2023-11-15 DIAGNOSIS — E785 Hyperlipidemia, unspecified: Secondary | ICD-10-CM | POA: Diagnosis not present

## 2023-11-15 DIAGNOSIS — K219 Gastro-esophageal reflux disease without esophagitis: Secondary | ICD-10-CM | POA: Diagnosis not present

## 2023-11-15 DIAGNOSIS — E890 Postprocedural hypothyroidism: Secondary | ICD-10-CM | POA: Diagnosis not present

## 2023-11-15 DIAGNOSIS — R0609 Other forms of dyspnea: Secondary | ICD-10-CM | POA: Diagnosis not present

## 2023-11-15 DIAGNOSIS — I890 Lymphedema, not elsewhere classified: Secondary | ICD-10-CM

## 2023-11-15 DIAGNOSIS — R0602 Shortness of breath: Secondary | ICD-10-CM | POA: Diagnosis not present

## 2023-11-15 DIAGNOSIS — G4733 Obstructive sleep apnea (adult) (pediatric): Secondary | ICD-10-CM | POA: Diagnosis not present

## 2023-11-15 DIAGNOSIS — J301 Allergic rhinitis due to pollen: Secondary | ICD-10-CM | POA: Diagnosis not present

## 2023-11-15 DIAGNOSIS — R600 Localized edema: Secondary | ICD-10-CM | POA: Diagnosis not present

## 2023-11-15 DIAGNOSIS — R002 Palpitations: Secondary | ICD-10-CM | POA: Diagnosis not present

## 2023-11-15 DIAGNOSIS — I152 Hypertension secondary to endocrine disorders: Secondary | ICD-10-CM | POA: Diagnosis not present

## 2023-11-15 DIAGNOSIS — E1169 Type 2 diabetes mellitus with other specified complication: Secondary | ICD-10-CM | POA: Diagnosis not present

## 2023-11-16 ENCOUNTER — Telehealth: Payer: Self-pay | Admitting: Pain Medicine

## 2023-11-16 DIAGNOSIS — M65322 Trigger finger, left index finger: Secondary | ICD-10-CM | POA: Diagnosis not present

## 2023-11-16 DIAGNOSIS — E785 Hyperlipidemia, unspecified: Secondary | ICD-10-CM | POA: Diagnosis not present

## 2023-11-16 DIAGNOSIS — Z6841 Body Mass Index (BMI) 40.0 and over, adult: Secondary | ICD-10-CM | POA: Diagnosis not present

## 2023-11-16 DIAGNOSIS — E079 Disorder of thyroid, unspecified: Secondary | ICD-10-CM | POA: Diagnosis not present

## 2023-11-16 DIAGNOSIS — E119 Type 2 diabetes mellitus without complications: Secondary | ICD-10-CM | POA: Diagnosis not present

## 2023-11-16 DIAGNOSIS — Z79899 Other long term (current) drug therapy: Secondary | ICD-10-CM | POA: Diagnosis not present

## 2023-11-16 DIAGNOSIS — Z0000 Encounter for general adult medical examination without abnormal findings: Secondary | ICD-10-CM | POA: Diagnosis not present

## 2023-11-16 DIAGNOSIS — I10 Essential (primary) hypertension: Secondary | ICD-10-CM | POA: Diagnosis not present

## 2023-11-16 DIAGNOSIS — R8290 Unspecified abnormal findings in urine: Secondary | ICD-10-CM | POA: Diagnosis not present

## 2023-11-16 NOTE — Telephone Encounter (Signed)
 Patient called [facility] and was told they had contacted [place] about the denial. They do not take [employer] Ins.  I advised patient to call her insurance and find out who takes her [employer] then call us back.

## 2023-11-17 DIAGNOSIS — H532 Diplopia: Secondary | ICD-10-CM | POA: Diagnosis not present

## 2023-11-17 NOTE — Progress Notes (Addendum)
 Subjective:    Patient ID: Karen Lewis, female    DOB: Feb 28, 1971, 53 y.o.   MRN: [id number] Chief Complaint  Patient presents with   Follow-up    f/u in 1 year with no studies    Karen Lewis is a 53 year old female who returns today for follow-up evaluation of her bilateral lower extremity edema.  The patient has a lymphedema pump which has been very helpful in her care until recently.  She has significant arthritis and the pump has been causing her excruciating pain when she utilizes it.  Therefore she has not been able to use it for months.  Since that time her swelling has worsened.  She tries to elevate her lower extremities is much as possible.  Activity is difficult with her arthritic issues as well.  In addition the patient has notable swelling and discomfort in her abdomen as well.  She also utilizes compression and compression wraps but even with this her swelling is still not contained as well as it was when she was using her lymphedema pump regularly.    Today we measured her lower extremities.  She had 58 cm right calf and 24 cm right ankle.  Her left calf is 53 cm and the ankle is 24 cm.    Review of Systems  Cardiovascular:  Positive for leg swelling.  Neurological:  Positive for weakness.  All other systems reviewed and are negative.      Objective:   Physical Exam Vitals reviewed.  HENT:     Head: Normocephalic.  Cardiovascular:     Rate and Rhythm: Normal rate.  Pulmonary:     Effort: Pulmonary effort is normal.  Musculoskeletal:     Right lower leg: Edema present.     Left lower leg: Edema present.  Skin:    General: Skin is warm and dry.  Neurological:     Mental Status: She is alert and oriented to person, place, and time.  Psychiatric:        Mood and Affect: Mood normal.        Behavior: Behavior normal.        Thought Content: Thought content normal.        Judgment: Judgment normal.     BP 118/76   Pulse 85   Resp 18   Ht 5\' 3"  (1.6 m)    Wt (!) 330 lb (149.7 kg)   BMI 58.46 kg/m   Past Medical History:  Diagnosis Date   Anemia    Anginal pain (HCC)    Anxiety    Arthralgia of hip 07/29/2015   Arthritis    Arthritis, degenerative 07/29/2015   Asthma    Cephalalgia 07/25/2014   Dependence on unknown drug (HCC)    multiplt controlled drug dependence   Depression    Diabetes mellitus without complication (HCC)    Difficult intubation    per pt needs small tube (has had abrasions from tube in past)   Dysrhythmia    PVC's   Eczema    Fibromyalgia    Gastritis    GERD (gastroesophageal reflux disease)    Gonalgia 07/29/2015   Overview:  Overview:  The patient has had bilateral intra-articular Hyalgan injections done on 07/16/2014 and although she seems to do well with this type of therapy, apparently her insurance company does not want to pay for they Hyalgan. On 11/27/2014 the patient underwent a bilateral genicular nerve block with excellent results. On 01/28/2015 she had a right knee genicular  radiofrequency ablatio   Gout    H/O cardiovascular disorder 03/10/2015   H/O surgical procedure 12/05/2012   Overview:  LSG ([facility] - April 2013)     H/O thyroid disease 03/10/2015   Headache    Herpes    History of artificial joint 07/29/2015   History of hiatal hernia    Hypertension    Hypomagnesemia    Hypothyroidism    IDA (iron deficiency anemia) 05/28/2019   LBP (low back pain) 07/29/2015   Neuromuscular disorder (HCC)    Obesity    PCOS (polycystic ovarian syndrome)    Primary osteoarthritis of both knees 07/29/2015   Sleep apnea    not using a Cpap machine at this time - most recent test mild apnea does not qualify for test (not OSA)   Thyroid nodule bilateral   Umbilical hernia     Social History   Socioeconomic History   Marital status: Married    Spouse name: Not on file   Number of children: 0   Years of education: 12   Highest education level: High school graduate  Occupational History    Occupation: Disabled  Tobacco Use   Smoking status: Never   Smokeless tobacco: Never  Vaping Use   Vaping status: Never Used  Substance and Sexual Activity   Alcohol use: No    Alcohol/week: 0.0 standard drinks of alcohol   Drug use: No   Sexual activity: Not Currently    Birth control/protection: Injection  Other Topics Concern   Not on file  Social History Narrative   Not on file   Social Drivers of Health   Financial Resource Strain: Low Risk  (11/16/2023)   Received from [facility]   Overall Financial Resource Strain (CARDIA)    Difficulty of Paying Living Expenses: Not hard at all  Food Insecurity: No Food Insecurity (11/16/2023)   Received from [facility]   Hunger Vital Sign    Worried About Running Out of Food in the Last Year: Never true    Ran Out of Food in the Last Year: Never true  Transportation Needs: No Transportation Needs (11/16/2023)   Received from [facility] - Transportation    In the past 12 months, has lack of transportation kept you from medical appointments or from getting medications?: No    Lack of Transportation (Non-Medical): No  Physical Activity: Inactive (12/14/2019)   Exercise Vital Sign    Days of Exercise per Week: 0 days    Minutes of Exercise per Session: 0 min  Stress: No Stress Concern Present (01/20/2022)   [employer] of Occupational Health - Occupational Stress Questionnaire    Feeling of Stress : Only a little  Social Connections: Moderately Integrated (01/20/2022)   Social Connection and Isolation Panel [NHANES]    Frequency of Communication with Friends and Family: More than three times a week    Frequency of Social Gatherings with Friends and Family: More than three times a week    Attends Religious Services: More than 4 times per year    Active Member of [employer] or Organizations: No    Attends Banker Meetings: Never    Marital Status: Married   Catering manager Violence: Not At Risk (12/14/2019)   Humiliation, Afraid, Rape, and Kick questionnaire    Fear of Current or Ex-Partner: No    Emotionally Abused: No    Physically Abused: No    Sexually Abused: No  Past Surgical History:  Procedure Laterality Date   APPENDECTOMY     CARPAL TUNNEL RELEASE Bilateral    CHOLECYSTECTOMY     COLONOSCOPY WITH PROPOFOL N/A 02/11/2021   Procedure: COLONOSCOPY WITH PROPOFOL;  Surgeon: Toledo, Alphonsus Jeans, MD;  Location: [facility];  Service: Gastroenterology;  Laterality: N/A;   DIAGNOSTIC LAPAROSCOPY     ESOPHAGOGASTRODUODENOSCOPY (EGD) WITH PROPOFOL N/A 02/11/2021   Procedure: ESOPHAGOGASTRODUODENOSCOPY (EGD) WITH PROPOFOL;  Surgeon: Toledo, Alphonsus Jeans, MD;  Location: [facility];  Service: Gastroenterology;  Laterality: N/A;   HIATAL HERNIA REPAIR     JOINT REPLACEMENT Bilateral hip   LAPAROSCOPIC PARTIAL GASTRECTOMY     left trigger finger     peniculectomy N/A 07/05/2018   ROUX-EN-Y GASTRIC BYPASS  06/03/2017   SHOULDER ARTHROSCOPY Right    THYROIDECTOMY N/A 11/12/2015   Procedure: THYROIDECTOMY;  Surgeon: Von Grumbling, MD;  Location: [facility] OR[facility];  Service: ENT;  Laterality: N/A;   TOTAL HIP ARTHROPLASTY Right 11/27/2019   Procedure: TOTAL HIP ARTHROPLASTY ANTERIOR APPROACH;  Surgeon: Molli Angelucci, MD;  Location: [facility] OR[facility];  Service: Orthopedics;  Laterality: Right;   TRIGGER FINGER RELEASE Right    TRIGGER FINGER RELEASE Right 04/24/2020   Procedure: Right ring and middle trigger release;  Surgeon: Molli Angelucci, MD;  Location: [facility] OR[facility];  Service: Orthopedics;  Laterality: Right;    Family History  Problem Relation Age of Onset   Anxiety disorder Mother    Depression Mother    Alcohol abuse Mother    Diabetes Mother    Hypertension Mother    Kidney cancer Mother    Sleep apnea Mother    Alcohol abuse Father    Anxiety disorder Father    Depression Father    Post-traumatic stress disorder Father    Kidney failure Father     COPD Father    Diabetes Father    Hypertension Father    Sleep apnea Father    Depression Brother    Diabetes Brother    Hypertension Brother    Sleep apnea Brother    Breast cancer Paternal Aunt 42   Breast cancer Paternal Aunt 43   Brain cancer Paternal Uncle 69   Bladder Cancer Neg Hx    Prostate cancer Neg Hx     Allergies  Allergen Reactions   Bactrim [Sulfamethoxazole-Trimethoprim] Hives   Omalizumab Itching and Hives   Ciprofloxacin Other (See Comments)    myalgia    Shellfish Allergy Other (See Comments)    + positive allergy test   Nsaids Nausea Only and Other (See Comments)    Stomach upset       Latest Ref Rng & Units 06/27/2023   11:03 AM 11/24/2022   10:08 AM 08/08/2022    3:51 AM  CBC  WBC 4.0 - 10.5 K/uL 8.8  14.8  7.3   Hemoglobin 12.0 - 15.0 g/dL 16.1  09.6  04.5   Hematocrit 36.0 - 46.0 % 41.2  46.6  34.7   Platelets 150 - 400 K/uL 315  354  201       CMP     Component Value Date/Time   NA 142 08/09/2022 0418   NA 139 06/17/2014 1811   K 4.8 08/09/2022 0418   K 3.6 06/17/2014 1811   CL 116 (H) 08/09/2022 0418   CL 111 (H) 06/17/2014 1811   CO2 21 (L) 08/09/2022 0418   CO2 22 06/17/2014 1811   GLUCOSE 117 (H) 08/09/2022 0418   GLUCOSE 99 06/17/2014 1811   BUN 17  08/09/2022 0418   BUN 7 06/17/2014 1811   CREATININE 1.04 (H) 08/09/2022 0418   CREATININE 0.98 06/17/2014 1811   CALCIUM 7.8 (L) 08/09/2022 0418   CALCIUM 8.5 06/17/2014 1811   PROT 6.2 (L) 08/07/2022 1537   PROT 7.4 06/17/2014 1811   ALBUMIN 3.5 08/07/2022 1537   ALBUMIN 3.5 06/17/2014 1811   AST 25 08/07/2022 1537   AST 23 06/17/2014 1811   ALT 40 08/07/2022 1537   ALT 22 06/17/2014 1811   ALKPHOS 74 08/07/2022 1537   ALKPHOS 90 06/17/2014 1811   BILITOT 0.5 08/07/2022 1537   BILITOT 0.2 06/17/2014 1811   GFRNONAA >60 08/09/2022 0418   GFRNONAA >60 06/17/2014 1811   GFRNONAA >60 12/13/2013 1628     No results found.     Assessment & Plan:   1. Lymphedema  (Primary) Recommend:  No surgery or intervention at this point in time.   The Patient is CEAP [id number].  The patient has been wearing compression for more than 4 weeks with no or little benefit.  The patient has been exercising daily for more than 4 weeks. The patient has been elevating and taking OTC pain medications for more than 4 weeks.  None of these have have eliminated the pain related to the lymphedema or the discomfort regarding excessive swelling and venous congestion.    In addition the patient has been using the basic pump model [id number] for at least 4 weeks and it has not been effective with controlling her swelling due to the pain caused from the arthritis in her wounds with use.  I believe the Flexitouch will be much more beneficial for the patient and adaptive for her arthritic issues.  I have reviewed my discussion with the patient regarding lymphedema and why it  causes symptoms.  Patient will continue wearing graduated compression on a daily basis. The patient should put the compression on first thing in the morning and removing them in the evening. The patient should not sleep in the compression.   In addition, behavioral modification throughout the day will be continued.  This will include frequent elevation (such as in a recliner), use of over the counter pain medications as needed and exercise such as walking.  The systemic causes for chronic edema such as liver, kidney and cardiac etiologies do not appear to have significant changed over the past year.    The patient has chronic , severe lymphedema with hyperpigmentation of the skin and has done MLD, skin care, medication, diet, exercise, elevation and compression for 4 weeks with no improvement,  I am recommending a lymphedema pump.  The patient still has stage 3 lymphedema and therefore, I believe that a lymph pump is needed to improve the control of the patient's lymphedema and improve the quality of life.  Additionally, a  lymph pump is warranted because it will reduce the risk of cellulitis and ulceration in the future.  Addendum: Patient will also benefit from the trunk attachment to help with her symptoms of abdominal fullness and discomfort as well.  I believe that her previous basic pump has pushed fluid proximally this causing her abdominal fullness and discomfort, which is why the advanced model will be a better option for her.  Patient should follow-up in 1 year  2. Type 2 diabetes mellitus without complication, without long-term current use of insulin (HCC) Continue hypoglycemic medications as already ordered, these medications have been reviewed and there are no changes at this time.  Hgb A1C to be  monitored as already arranged by primary service   Current Outpatient Medications on File Prior to Visit  Medication Sig Dispense Refill   ACCU-CHEK AVIVA PLUS test strip      acetaminophen (TYLENOL) 650 MG CR tablet Take 650-1,300 mg by mouth every 8 (eight) hours as needed for pain.     acidophilus (RISAQUAD) CAPS capsule Take 1 capsule by mouth daily.     albuterol (PROVENTIL HFA;VENTOLIN HFA) 108 (90 BASE) MCG/ACT inhaler Inhale 2 puffs into the lungs every 6 (six) hours as needed for wheezing or shortness of breath.     Alcohol Swabs (ALCOHOL PREP) 70 % PADS Apply topically.     amitriptyline (ELAVIL) 75 MG tablet Take 100 mg by mouth at bedtime.     Biotin 10 MG CAPS Take 10 mg by mouth daily.      butalbital-acetaminophen-caffeine (FIORICET) 50-325-40 MG tablet Take 1 tablet by mouth every 6 (six) hours as needed for migraine.      calcium carbonate (TUMS EX) 750 MG chewable tablet Chew 1 tablet by mouth daily.      clonazePAM (KLONOPIN) 0.5 MG tablet Take 0.5 mg by mouth 2 (two) times daily as needed for anxiety.     diclofenac sodium (VOLTAREN) 1 % GEL Apply 2 g topically 4 (four) times daily. (Patient taking differently: Apply 2 g topically 3 (three) times daily as needed (pain).) 100 g 1    diphenhydrAMINE (BENADRYL) 25 MG tablet Take 25 mg by mouth every 8 (eight) hours as needed for itching or allergies.      DULoxetine (CYMBALTA) 60 MG capsule Take 60 mg by mouth daily.     EPINEPHrine 0.3 mg/0.3 mL IJ SOAJ injection Inject 0.3 mg into the muscle as needed for anaphylaxis.     ergocalciferol (VITAMIN D2) 1.25 MG (50000 UT) capsule Take 50,000 Units by mouth 2 (two) times a week.      famotidine (PEPCID) 20 MG tablet Take 20 mg by mouth 2 (two) times daily.     Galcanezumab-gnlm (EMGALITY) 120 MG/ML SOAJ Inject 120 mg into the skin every 30 (thirty) days.     glucose blood (ACCU-CHEK AVIVA PLUS) test strip Use 2 (two) times daily     levocetirizine (XYZAL) 5 MG tablet Take 5 mg by mouth at bedtime.      levothyroxine (SYNTHROID) 125 MCG tablet Take 125 mcg by mouth every morning.     lisinopril (ZESTRIL) 5 MG tablet Take 5 mg by mouth daily.     magnesium oxide (MAG-OX) 400 MG tablet Take 1,200 mg by mouth 2 (two) times daily. Take 1200 mg by mouth in the morning and 1200 mg at bedtime     meclizine (ANTIVERT) 25 MG tablet Take 25 mg by mouth 3 (three) times daily as needed for dizziness.      metFORMIN (GLUCOPHAGE-XR) 500 MG 24 hr tablet Take 1,000 mg by mouth 2 (two) times daily.     metoprolol tartrate (LOPRESSOR) 50 MG tablet Take 50 mg by mouth in the morning, at noon, and at bedtime. 50 mg TID and an additonal 50mg  if needed     montelukast (SINGULAIR) 10 MG tablet Take 10 mg by mouth at bedtime.      Multiple Vitamin (TAB-A-VITE) TABS Take 1 tablet by mouth daily.     naloxone (NARCAN) nasal spray 4 mg/0.1 mL Place 1 spray into the nose as needed for up to 365 doses (for opioid-induced respiratory depresssion). In case of emergency (overdose), spray once into each  nostril. If no response within 3 minutes, repeat application and call 911. 1 each 1   naphazoline-pheniramine (NAPHCON-A) 0.025-0.3 % ophthalmic solution Place 1 drop into both eyes 4 (four) times daily as needed for  eye irritation.     nitrofurantoin (MACRODANTIN) 100 MG capsule Take 1 capsule (100 mg total) by mouth 4 (four) times daily. 30 capsule 11   omeprazole (PRILOSEC) 40 MG capsule Take 40 mg by mouth in the morning and at bedtime.     oxyCODONE (OXY IR/ROXICODONE) 5 MG immediate release tablet Take 1 tablet (5 mg total) by mouth every 8 (eight) hours as needed for severe pain (pain score 7-10). Must last 30 days 90 tablet 0   oxyCODONE (OXY IR/ROXICODONE) 5 MG immediate release tablet Take 1 tablet (5 mg total) by mouth every 8 (eight) hours as needed for severe pain (pain score 7-10). Must last 30 days 90 tablet 0   [START ON 11/25/2023] oxyCODONE (OXY IR/ROXICODONE) 5 MG immediate release tablet Take 1 tablet (5 mg total) by mouth every 8 (eight) hours as needed for severe pain (pain score 7-10). Must last 30 days 90 tablet 0   OZEMPIC, 0.25 OR 0.5 MG/DOSE, 2 MG/1.5ML SOPN Inject 1 mg into the skin once a week.     phentermine (ADIPEX-P) 37.5 MG tablet Take 37.5 mg by mouth daily before breakfast.     pregabalin (LYRICA) 225 MG capsule Take 225 mg by mouth 2 (two) times daily.     QUEtiapine (SEROQUEL) 50 MG tablet Take 50 mg by mouth at bedtime.     rizatriptan (MAXALT) 10 MG tablet Take 1 and may repeat in 2 hours for MIgraines, alternate with the disintrgrating tablet.     rizatriptan (MAXALT-MLT) 10 MG disintegrating tablet Take 10 mg by mouth. Take 1 and may repeat in 2 hours  for Migraines     rosuvastatin (CRESTOR) 40 MG tablet Take 40 mg by mouth daily.      tiZANidine (ZANAFLEX) 4 MG tablet Take 1 tablet (4 mg total) by mouth 2 (two) times daily as needed for muscle spasms. 30 tablet 0   topiramate (TOPAMAX) 100 MG tablet Take 100 mg by mouth 2 (two) times daily.     No current facility-administered medications on file prior to visit.    There are no Patient Instructions on file for this visit. Return in about 1 year (around 11/14/2024) for Lymphedema.   Itzabella Sorrels E Bryttani Blew, NP

## 2023-11-23 DIAGNOSIS — K912 Postsurgical malabsorption, not elsewhere classified: Secondary | ICD-10-CM | POA: Diagnosis not present

## 2023-11-23 DIAGNOSIS — Z9884 Bariatric surgery status: Secondary | ICD-10-CM | POA: Diagnosis not present

## 2023-11-23 DIAGNOSIS — Z713 Dietary counseling and surveillance: Secondary | ICD-10-CM | POA: Diagnosis not present

## 2023-11-23 DIAGNOSIS — Z6841 Body Mass Index (BMI) 40.0 and over, adult: Secondary | ICD-10-CM | POA: Diagnosis not present

## 2023-11-30 DIAGNOSIS — Z9884 Bariatric surgery status: Secondary | ICD-10-CM | POA: Diagnosis not present

## 2023-11-30 DIAGNOSIS — L574 Cutis laxa senilis: Secondary | ICD-10-CM | POA: Insufficient documentation

## 2023-11-30 DIAGNOSIS — G43719 Chronic migraine without aura, intractable, without status migrainosus: Secondary | ICD-10-CM | POA: Diagnosis not present

## 2023-12-01 DIAGNOSIS — M5412 Radiculopathy, cervical region: Secondary | ICD-10-CM | POA: Diagnosis not present

## 2023-12-01 DIAGNOSIS — M19012 Primary osteoarthritis, left shoulder: Secondary | ICD-10-CM | POA: Diagnosis not present

## 2023-12-10 ENCOUNTER — Other Ambulatory Visit (INDEPENDENT_AMBULATORY_CARE_PROVIDER_SITE_OTHER): Payer: Self-pay | Admitting: Nurse Practitioner

## 2023-12-10 DIAGNOSIS — I890 Lymphedema, not elsewhere classified: Secondary | ICD-10-CM

## 2023-12-12 ENCOUNTER — Telehealth (INDEPENDENT_AMBULATORY_CARE_PROVIDER_SITE_OTHER): Payer: Self-pay

## 2023-12-12 NOTE — Telephone Encounter (Signed)
 [name] called stating that at her last visit she set up with [employer] to get pumps and they are coming out to measure her. She is questioning the referral for the lymphedema clinic to receive massages and therapy for the swelling, also the compression stocking that you all spoke about. She would like to know should she still be getting the socks.   Please advise.  I seen where the referral is Pending for the lymph clinic.

## 2023-12-12 NOTE — Telephone Encounter (Signed)
 Yes she should be getting the socks.  In order to achieve optimal control of her lymphedema she should optimally be doing all things for her lymphedema.  That includes the mainstay of utilizing medical grade compression socks.  The primary tenants of controlling her lymphedema are engaging with medical grade compression that she is wearing day-to-day, elevating her lower extremities, activity and use of lymphedema pump.  Using the pump as well as going to the lymphedema clinic will not negate the need for her compression socks.

## 2023-12-13 NOTE — Telephone Encounter (Signed)
 Message given

## 2023-12-21 ENCOUNTER — Encounter: Payer: Medicare HMO | Admitting: Pain Medicine

## 2023-12-22 ENCOUNTER — Ambulatory Visit: Attending: Nurse Practitioner | Admitting: Occupational Therapy

## 2023-12-22 DIAGNOSIS — F331 Major depressive disorder, recurrent, moderate: Secondary | ICD-10-CM | POA: Diagnosis not present

## 2023-12-22 DIAGNOSIS — I890 Lymphedema, not elsewhere classified: Secondary | ICD-10-CM

## 2023-12-23 ENCOUNTER — Encounter: Payer: Self-pay | Admitting: Occupational Therapy

## 2023-12-26 ENCOUNTER — Inpatient Hospital Stay: Payer: Medicare HMO | Attending: Oncology

## 2023-12-26 ENCOUNTER — Inpatient Hospital Stay: Payer: Medicare HMO | Admitting: Oncology

## 2023-12-26 ENCOUNTER — Encounter: Payer: Self-pay | Admitting: Pain Medicine

## 2023-12-26 ENCOUNTER — Inpatient Hospital Stay: Payer: Medicare HMO

## 2023-12-26 ENCOUNTER — Encounter: Payer: Self-pay | Admitting: Oncology

## 2023-12-26 ENCOUNTER — Ambulatory Visit: Attending: Pain Medicine | Admitting: Pain Medicine

## 2023-12-26 VITALS — BP 93/62 | HR 91 | Temp 97.9°F | Resp 18 | Wt 337.0 lb

## 2023-12-26 VITALS — BP 105/76

## 2023-12-26 VITALS — BP 115/76 | HR 81 | Temp 97.3°F | Ht 63.0 in | Wt 336.0 lb

## 2023-12-26 DIAGNOSIS — M5030 Other cervical disc degeneration, unspecified cervical region: Secondary | ICD-10-CM | POA: Diagnosis present

## 2023-12-26 DIAGNOSIS — M545 Low back pain, unspecified: Secondary | ICD-10-CM | POA: Insufficient documentation

## 2023-12-26 DIAGNOSIS — G8929 Other chronic pain: Secondary | ICD-10-CM | POA: Diagnosis not present

## 2023-12-26 DIAGNOSIS — M47816 Spondylosis without myelopathy or radiculopathy, lumbar region: Secondary | ICD-10-CM | POA: Insufficient documentation

## 2023-12-26 DIAGNOSIS — Z79899 Other long term (current) drug therapy: Secondary | ICD-10-CM | POA: Insufficient documentation

## 2023-12-26 DIAGNOSIS — M25512 Pain in left shoulder: Secondary | ICD-10-CM | POA: Insufficient documentation

## 2023-12-26 DIAGNOSIS — M153 Secondary multiple arthritis: Secondary | ICD-10-CM | POA: Diagnosis not present

## 2023-12-26 DIAGNOSIS — Z79891 Long term (current) use of opiate analgesic: Secondary | ICD-10-CM | POA: Insufficient documentation

## 2023-12-26 DIAGNOSIS — D508 Other iron deficiency anemias: Secondary | ICD-10-CM

## 2023-12-26 DIAGNOSIS — M25551 Pain in right hip: Secondary | ICD-10-CM | POA: Insufficient documentation

## 2023-12-26 DIAGNOSIS — M25561 Pain in right knee: Secondary | ICD-10-CM | POA: Diagnosis not present

## 2023-12-26 DIAGNOSIS — D509 Iron deficiency anemia, unspecified: Secondary | ICD-10-CM | POA: Insufficient documentation

## 2023-12-26 DIAGNOSIS — M25562 Pain in left knee: Secondary | ICD-10-CM | POA: Diagnosis not present

## 2023-12-26 DIAGNOSIS — M5137 Other intervertebral disc degeneration, lumbosacral region with discogenic back pain only: Secondary | ICD-10-CM | POA: Diagnosis not present

## 2023-12-26 DIAGNOSIS — G894 Chronic pain syndrome: Secondary | ICD-10-CM | POA: Insufficient documentation

## 2023-12-26 DIAGNOSIS — M5412 Radiculopathy, cervical region: Secondary | ICD-10-CM | POA: Diagnosis not present

## 2023-12-26 DIAGNOSIS — M542 Cervicalgia: Secondary | ICD-10-CM | POA: Diagnosis present

## 2023-12-26 DIAGNOSIS — Z9884 Bariatric surgery status: Secondary | ICD-10-CM | POA: Insufficient documentation

## 2023-12-26 LAB — IRON AND TIBC
Iron: 72 ug/dL (ref 28–170)
Saturation Ratios: 16 % (ref 10.4–31.8)
TIBC: 455 ug/dL — ABNORMAL HIGH (ref 250–450)
UIBC: 383 ug/dL

## 2023-12-26 LAB — CBC WITH DIFFERENTIAL (CANCER CENTER ONLY)
Abs Immature Granulocytes: 0.03 10*3/uL (ref 0.00–0.07)
Basophils Absolute: 0.1 10*3/uL (ref 0.0–0.1)
Basophils Relative: 1 %
Eosinophils Absolute: 0.2 10*3/uL (ref 0.0–0.5)
Eosinophils Relative: 3 %
HCT: 40.7 % (ref 36.0–46.0)
Hemoglobin: 13 g/dL (ref 12.0–15.0)
Immature Granulocytes: 0 %
Lymphocytes Relative: 30 %
Lymphs Abs: 2.3 10*3/uL (ref 0.7–4.0)
MCH: 28.8 pg (ref 26.0–34.0)
MCHC: 31.9 g/dL (ref 30.0–36.0)
MCV: 90.2 fL (ref 80.0–100.0)
Monocytes Absolute: 0.5 10*3/uL (ref 0.1–1.0)
Monocytes Relative: 6 %
Neutro Abs: 4.8 10*3/uL (ref 1.7–7.7)
Neutrophils Relative %: 60 %
Platelet Count: 312 10*3/uL (ref 150–400)
RBC: 4.51 MIL/uL (ref 3.87–5.11)
RDW: 13.5 % (ref 11.5–15.5)
WBC Count: 7.9 10*3/uL (ref 4.0–10.5)
nRBC: 0 % (ref 0.0–0.2)

## 2023-12-26 LAB — FERRITIN: Ferritin: 36 ng/mL (ref 11–307)

## 2023-12-26 LAB — VITAMIN B12: Vitamin B-12: 1046 pg/mL — ABNORMAL HIGH (ref 180–914)

## 2023-12-26 MED ORDER — IRON SUCROSE 20 MG/ML IV SOLN
200.0000 mg | Freq: Once | INTRAVENOUS | Status: AC
Start: 2023-12-26 — End: 2023-12-26
  Administered 2023-12-26: 200 mg via INTRAVENOUS

## 2023-12-26 MED ORDER — OXYCODONE HCL 5 MG PO TABS: 5.0000 mg | ORAL_TABLET | Freq: Three times a day (TID) | ORAL | 0 refills | Status: DC | PRN

## 2023-12-26 NOTE — Progress Notes (Signed)
 PROVIDER NOTE: Interpretation of information contained herein should be left to medically-trained personnel. Specific patient instructions are provided elsewhere under "Patient Instructions" section of medical record. This document was created in part using AI and STT-dictation technology, any transcriptional errors that may result from this process are unintentional.  Patient: Karen Lewis  Service: E/M   PCP: Yehuda Helms, MD  DOB: Jan 21, 1971  DOS: 12/26/2023  Provider: Candi Chafe, MD  MRN: [id number]  Delivery: Face-to-face  Specialty: Interventional Pain Management  Type: Established Patient  Setting: Ambulatory outpatient facility  Specialty designation: 09  Referring Prov.: Yehuda Helms, MD  Location: Outpatient office facility       HPI  [name], a 53 y.o. year old female, is here today because of her Chronic pain syndrome [G89.4]. Ms. Schnapp primary complain today is Knee Pain (right)  Pertinent problems: Ms. Travieso has Fibromyalgia; Chronic knee pain (1ry area of Pain) (Bilateral) (R>L); Chronic low back pain (3ry area of Pain) (Bilateral) (R>L) w/o sciatica; Lumbar facet syndrome (Bilateral) (R>L); Secondary osteoarthritis of multiple sites; Grade 1  Anterolisthesis of L3/L4 & L4/L5 (1.4 cm); Chronic hip pain s/p THR (2ry area of pain) (Bilateral); S/P THR (total hip replacement) (Left); Lumbar spondylosis; Chronic pain syndrome; Neurogenic pain; Upper extremity pain; Chronic shoulder pain (Left); Osteoarthritis of shoulder (Left); Osteoarthritis of hip (Right); Secondary Osteoarthritis of knee (Bilateral) (R>L); Spondylosis without myelopathy or radiculopathy, lumbosacral region; Panniculitis; Leg swelling; Edema of both legs; History of total hip replacement (Bilateral); S/P THR (total hip replacement) (Right) (11/27/2019); Pain and numbness of left upper extremity; Cervical radiculitis (Left); Aftercare following joint replacement surgery; Bilateral primary osteoarthritis  of knee; Osteoarthritis of lumbar spine without myelopathy or radiculopathy; Lumbosacral radiculopathy at S1 (Left); Tricompartment osteoarthritis of knees (Bilateral); Lumbosacral radiculopathy at L2 (Right); Abnormal MRI, lumbar spine (07/01/2022); Chronic thigh pain (Right); Weakness of leg (Right); Lumbar interspinous bursitis (L2-L5); Spinal enthesopathy of lumbar region [facility]); Lumbar facet arthropathy (Multilevel) (Bilateral); Lumbar central spinal stenosis, w/o claudication (L2-3, L4-5, L5-S1); Lumbar foraminal stenosis (Bilateral: L2-3, L4-5) (L>R); Lumbosacral lateral recess stenosis (Bilateral: L4-5, L5-S1); Trigger point of right shoulder region; Presence of artificial hip joint, bilateral; Lumbar facet joint pain; DDD (degenerative disc disease), lumbosacral; Cervical radiculopathy at C6; Cervicalgia; and DDD (degenerative disc disease), cervical on their pertinent problem list. Pain Assessment: Severity of Chronic pain is reported as a 5 /10. Location: Knee Right/ . Onset: More than a month ago. Quality: Aching, Burning, Constant, Throbbing, Stabbing, Shooting, Sharp, Discomfort, Numbness. Timing: Constant. Modifying factor(s): Meds, ice, repositioning,. Vitals:  height is 5\' 3"  (1.6 m) and weight is 336 lb (152.4 kg) (abnormal). Her temperature is 97.3 F (36.3 C) (abnormal). Her blood pressure is 115/76 and her pulse is 81. Her oxygen saturation is 100%.  BMI: Estimated body mass index is 59.52 kg/m as calculated from the following:   Height as of this encounter: 5\' 3"  (1.6 m).   Weight as of this encounter: 336 lb (152.4 kg). Last encounter: 09/26/2023. Last procedure: 07/07/2023.  Reason for encounter: medication management.  The patient indicates doing well with the current medication regimen. No adverse reactions or side effects reported to the medications.  However, the patient indicates having a new problem.  Discussed the use of AI scribe software for clinical note transcription  with the patient, who gave verbal consent to proceed.  History of Present Illness   The patient, with degenerative changes in the cervical spine, presents with neck and left shoulder pain  and numbness.  They have ongoing neck and left shoulder pain, diagnosed with degenerative changes at the C5-6 and C6-7 levels, with marked space marrow and posterior osteophyte formation, especially at C6-7. The pain radiates down the left arm, accompanied by numbness and a heavy feeling, affecting daily activities such as folding clothes, cooking, and playing cards.  The pain in the left shoulder and arm is described as feeling like 'somebody punched me in my arm,' with numbness extending into the elbow and affecting the left thumb, index, and middle fingers. A previous trigger injection in the left shoulder did not alleviate the symptoms.  They also report numbness in the right thigh, which is worsening and affecting the foot. There is a history of lumbar MRI in 2023, and the numbness is noted to be typical with their known condition of lumbar facet syndrome.  They are currently taking oxycodone without any reported side effects or adverse reactions. Their pharmacy remains the same, and their prescription monitoring program is up to date.     RTCB: 04/10/2024   Pharmacotherapy Assessment  Analgesic: Oxycodone IR 5 mg 1 tab PO TID (#90/mo) (15 mg/day) MME/day: 22.5 mg/day.    Monitoring: [place] PMP: PDMP reviewed during this encounter.       Pharmacotherapy: No side-effects or adverse reactions reported. Compliance: No problems identified. Effectiveness: Clinically acceptable.  Brigitte Pulse, RN  12/26/2023 10:41 AM  Sign when Signing Visit Nursing Pain Medication Assessment:  Safety precautions to be maintained throughout the outpatient stay will include: orient to surroundings, keep bed in low position, maintain call bell within reach at all times, provide assistance with transfer out of bed and  ambulation.  Medication Inspection Compliance: Pill count conducted under aseptic conditions, in front of the patient. Neither the pills nor the bottle was removed from the patient's sight at any time. Once count was completed pills were immediately returned to the patient in their original bottle.  Medication: Oxycodone IR Pill/Patch Count:  36 of 90 pills remain Pill/Patch Appearance: Markings consistent with prescribed medication Bottle Appearance: Standard pharmacy container. Clearly labeled. Filled Date: 3 / 29 / 2025 Last Medication intake:  TodaySafety precautions to be maintained throughout the outpatient stay will include: orient to surroundings, keep bed in low position, maintain call bell within reach at all times, provide assistance with transfer out of bed and ambulation.     No results found for: "CBDTHCR" No results found for: "D8THCCBX" No results found for: "D9THCCBX"  UDS:  Summary  Date Value Ref Range Status  06/08/2023 Note  Final    Comment:    ==================================================================== [employer] Select 13 (MW) ==================================================================== Test                             Result       Flag       Units  Drug Present and Declared for Prescription Verification   7-aminoclonazepam              68           EXPECTED   ng/mg creat    7-aminoclonazepam is an expected metabolite of clonazepam. Source of    clonazepam is a scheduled prescription medication.    Oxycodone                      416          EXPECTED   ng/mg creat   Oxymorphone  108          EXPECTED   ng/mg creat   Noroxycodone                   1904         EXPECTED   ng/mg creat   Noroxymorphone                 45           EXPECTED   ng/mg creat    Sources of oxycodone are scheduled prescription medications.    Oxymorphone, noroxycodone, and noroxymorphone are expected    metabolites of oxycodone. Oxymorphone is also  available as a    scheduled prescription medication.    Butalbital                     PRESENT      EXPECTED ==================================================================== Test                      Result    Flag   Units      Ref Range   Creatinine              262              mg/dL      >=16 ==================================================================== Declared Medications:  The flagging and interpretation on this report are based on the  following declared medications.  Unexpected results may arise from  inaccuracies in the declared medications.   **Note: The testing scope of this panel includes these medications:   Butalbital (Fioricet)  Clonazepam (Klonopin)  Oxycodone   **Note: The testing scope of this panel does not include the  following reported medications:   Acetaminophen  Acetaminophen (Fioricet)  Albuterol (Ventolin HFA)  Amitriptyline (Elavil)  Biotin  Caffeine (Fioricet)  Calcium (Tums)  Diphenhydramine (Benadryl)  Duloxetine (Cymbalta)  Epinephrine (EpiPen)  Famotidine (Pepcid)  Fluticasone (Breo)  Galcanezumab (Emgality)  Indeterminate Medication  Levocetirizine (Xyzal)  Levothyroxine (Synthroid)  Lisinopril (Zestril)  Magnesium (Mag-Ox)  Meclizine (Antivert)  Metformin  Metoprolol (Lopressor)  Montelukast (Singulair)  Multivitamin  Naloxone (Narcan)  Nitrofurantoin (Macrodantin)  Omeprazole (Prilosec)  Phentermine (Adipex)  Pregabalin (Lyrica)  Rizatriptan  Rosuvastatin (Crestor)  Semaglutide (Ozempic)  Tizanidine (Zanaflex)  Topical Diclofenac (Voltaren)  Topiramate (Topamax)  Vilanterol (Breo)  Vitamin D2 ==================================================================== For clinical consultation, please call [phone]. ====================================================================       ROS  Constitutional: Denies any fever or chills Gastrointestinal: No reported hemesis, hematochezia, vomiting, or  acute GI distress Musculoskeletal: Denies any acute onset joint swelling, redness, loss of ROM, or weakness Neurological: No reported episodes of acute onset apraxia, aphasia, dysarthria, agnosia, amnesia, paralysis, loss of coordination, or loss of consciousness  Medication Review  Alcohol Prep, Biotin, DULoxetine, EPINEPHrine, Galcanezumab-gnlm, QUEtiapine, Semaglutide(0.25 or 0.5MG /DOS), Tab-A-Vite, acetaminophen, acidophilus, albuterol, amitriptyline, butalbital-acetaminophen-caffeine, calcium carbonate, clonazePAM, diclofenac sodium, diphenhydrAMINE, ergocalciferol, famotidine, glucose blood, levocetirizine, levothyroxine, lisinopril, magnesium oxide, meclizine, metFORMIN, metoprolol tartrate, montelukast, naloxone, naphazoline-pheniramine, nitrofurantoin, omeprazole, oxyCODONE, phentermine, pregabalin, rizatriptan, rosuvastatin, tiZANidine, and topiramate  History Review  Allergy: Ms. Troutman is allergic to bactrim [sulfamethoxazole-trimethoprim], omalizumab, ciprofloxacin, shellfish allergy, and nsaids. Drug: Ms. Meuser  reports no history of drug use. Alcohol:  reports no history of alcohol use. Tobacco:  reports that she has never smoked. She has never used smokeless tobacco. Social: Ms. Beilfuss  reports that she has never smoked. She has never used smokeless tobacco. She reports that she does not drink alcohol and does not  use drugs. Medical:  has a past medical history of Anemia, Anginal pain (HCC), Anxiety, Arthralgia of hip (07/29/2015), Arthritis, Arthritis, degenerative (07/29/2015), Asthma, Cephalalgia (07/25/2014), Dependence on unknown drug (HCC), Depression, Diabetes mellitus without complication (HCC), Difficult intubation, Dysrhythmia, Eczema, Fibromyalgia, Gastritis, GERD (gastroesophageal reflux disease), Gonalgia (07/29/2015), Gout, H/O cardiovascular disorder (03/10/2015), H/O surgical procedure (12/05/2012), H/O thyroid disease (03/10/2015), Headache, Herpes, History of artificial  joint (07/29/2015), History of hiatal hernia, Hypertension, Hypomagnesemia, Hypothyroidism, IDA (iron deficiency anemia) (05/28/2019), LBP (low back pain) (07/29/2015), Neuromuscular disorder (HCC), Obesity, PCOS (polycystic ovarian syndrome), Primary osteoarthritis of both knees (07/29/2015), Sleep apnea, Thyroid nodule (bilateral), and Umbilical hernia. Surgical: Ms. Kent  has a past surgical history that includes Laparoscopic partial gastrectomy; Shoulder arthroscopy (Right); Carpal tunnel release (Bilateral); Diagnostic laparoscopy; Cholecystectomy; Trigger finger release (Right); Thyroidectomy (N/A, 11/12/2015); left trigger finger; Roux-en-Y Gastric Bypass (06/03/2017); Hiatal hernia repair; peniculectomy (N/A, 07/05/2018); Total hip arthroplasty (Right, 11/27/2019); Joint replacement (Bilateral, hip); Appendectomy; Trigger finger release (Right, 04/24/2020); Colonoscopy with propofol (N/A, 02/11/2021); and Esophagogastroduodenoscopy (egd) with propofol (N/A, 02/11/2021). Family: family history includes Alcohol abuse in her father and mother; Anxiety disorder in her father and mother; Brain cancer (age of onset: 33) in her paternal uncle; Breast cancer (age of onset: 77) in her paternal aunt and paternal aunt; COPD in her father; Depression in her brother, father, and mother; Diabetes in her brother, father, and mother; Hypertension in her brother, father, and mother; Kidney cancer in her mother; Kidney failure in her father; Post-traumatic stress disorder in her father; Sleep apnea in her brother, father, and mother.  Laboratory Chemistry Profile   Renal Lab Results  Component Value Date   BUN 17 08/09/2022   CREATININE 1.04 (H) 08/09/2022   GFRAA >60 04/16/2020   GFRNONAA >60 08/09/2022    Hepatic Lab Results  Component Value Date   AST 25 08/07/2022   ALT 40 08/07/2022   ALBUMIN 3.5 08/07/2022   ALKPHOS 74 08/07/2022   LIPASE 37 08/07/2022    Electrolytes Lab Results  Component Value Date    NA 142 08/09/2022   K 4.8 08/09/2022   CL 116 (H) 08/09/2022   CALCIUM 7.8 (L) 08/09/2022   MG 2.5 (H) 08/07/2022   PHOS 5.5 (H) 05/22/2019    Bone Lab Results  Component Value Date   VD25OH 27.4 (L) 11/24/2015   VD125OH2TOT 41.5 11/24/2015    Inflammation (CRP: Acute Phase) (ESR: Chronic Phase) Lab Results  Component Value Date   CRP 0.7 09/27/2019   ESRSEDRATE 30 (H) 09/27/2019   LATICACIDVEN 1.7 08/07/2022         Note: Above Lab results reviewed.  Recent Imaging Review  Spine Cervical Radiographs  AP and lateral cervical spine x-ray: Extensive degenerative changes at C5-6 and C6-7 with marked disc space narrowing and posterior osteophyte formation, especially at C6-7. AP view shows no scoliosis or acute process and moderate facet arthritis. (12/01/2023) Exam End: 12/01/23 13:32   EXAM: Left Shoulder Radiographs - 3 views (Grashey, Axillary, Scapular Y) performed 04/20/2023  CLINICAL INFORMATION: Chronic left shoulder pain  COMPARISON: None  FINDINGS:   Limited bony visualization due to underpenetration and some patient positioning issues.  Type II acromion.  Moderate AC arthritis change with joint space narrowing, osteophyte formation, subchondral cystic change.   Mild enthesopathic change to the lateral shoulder.  The distal clavicle, scapula, and proximal humerus are intact.  Glenohumeral and acromioclavicular joint alignment appears normal in these limited views.   No fractures or dislocations.  Well maintained glenohumeral  joint space without evidence of degenerative joint disease.  No soft tissue swelling or joint effusion.  No destructive bony lesion is identified. Exam End: 04/20/23 14:41    Note: Reviewed        Physical Exam  General appearance: Well nourished, well developed, and well hydrated. In no apparent acute distress Mental status: Alert, oriented x 3 (person, place, & time)       Respiratory: No evidence of acute respiratory  distress Eyes: PERLA Vitals: BP 115/76   Pulse 81   Temp (!) 97.3 F (36.3 C)   Ht 5\' 3"  (1.6 m)   Wt (!) 336 lb (152.4 kg)   SpO2 100%   BMI 59.52 kg/m  BMI: Estimated body mass index is 59.52 kg/m as calculated from the following:   Height as of this encounter: 5\' 3"  (1.6 m).   Weight as of this encounter: 336 lb (152.4 kg). Ideal: Ideal body weight: 52.4 kg (115 lb 8.3 oz) Adjusted ideal body weight: 92.4 kg (203 lb 11.4 oz)  Assessment   Diagnosis Status  1. Chronic pain syndrome   2. Bilateral chronic knee pain   3. Chronic hip pain, right   4. Chronic bilateral low back pain without sciatica   5. Degeneration of intervertebral disc of lumbosacral region with discogenic back pain   6. Facet syndrome, lumbar   7. Secondary osteoarthritis of multiple sites   8. Pharmacologic therapy   9. Chronic use of opiate for therapeutic purpose   10. Encounter for medication management   11. Encounter for chronic pain management   12. Cervicalgia   13. Cervical radiculitis (Left)   14. Chronic shoulder pain (Left)   15. Cervical radiculopathy at C6   16. DDD (degenerative disc disease), cervical    Controlled Controlled Controlled   Updated Problems: Problem  Cervical Radiculopathy At C6  Cervicalgia  Ddd (Degenerative Disc Disease), Cervical  Skin Laxity  Other Specified Postprocedural States   [facility] - April 2013)    [facility] - April 2013)     Plan of Care  Problem-specific:  Assessment and Plan    Cervical spondylosis with radiculopathy   Extensive degenerative changes at C5-6 and C6-7 with marked space marrow and posterior osteophyte formation, especially at C6-7, cause neck and left upper arm pain, with numbness extending into the left thumb, index, and middle fingers, indicating C6-C7 radiculopathy. This condition affects daily activities due to pain and weakness. An MRI of the cervical spine is necessary to evaluate the extent of the condition before  considering physical therapy. Order MRI of the cervical spine and consider referral for cervical physical therapy after MRI results.  Left shoulder osteoarthritis with bone spurs   Bone spurs and degenerative changes in the left shoulder joint contribute to pain and decreased range of motion, causing significant discomfort and functional limitations. An MRI of the left shoulder will be ordered after the cervical spine MRI to assess the need for potential surgical intervention. Order MRI of the left shoulder after cervical spine MRI.  Lumbar facet syndrome   Numbness in the right thigh and foot, potentially related to lumbar facet syndrome, with worsening symptoms. Referral to [facility] anesthesiology for radiofrequency ablation is planned to manage lumbar facet pain. Refer to [facility] anesthesiology for radiofrequency ablation for lumbar facet syndrome.  Chronic pain management   Currently on oxycodone for pain management without adverse effects. Pharmacy and prescription monitoring are up to date. Medication management will continue with refills provided for the  next three months. Provide oxycodone refills for the next three months and schedule follow-up for medication management around April 10, 2024.  Thyroid dysfunction post-thyroidectomy   Complete thyroidectomy with fluctuating thyroid levels affects weight and overall health. Monitoring of thyroid levels is necessary to manage symptoms effectively.       Ms. ALYISSA WHIDBEE has a current medication list which includes the following long-term medication(s): albuterol, diphenhydramine, magnesium oxide, montelukast, naloxone, [START ON 01/11/2024] oxycodone, [START ON 02/10/2024] oxycodone, [START ON 03/11/2024] oxycodone, rizatriptan, and rizatriptan.  Pharmacotherapy (Medications Ordered): Meds ordered this encounter  Medications   oxyCODONE (OXY IR/ROXICODONE) 5 MG immediate release tablet    Sig: Take 1 tablet (5 mg total) by mouth every 8 (eight) hours  as needed for severe pain (pain score 7-10). Must last 30 days    Dispense:  90 tablet    Refill:  0    Not a duplicate. Do NOT delete! Dispense 1 day early if closed on refill date. Avoid benzodiazepines within 8 hours of opioids. Do not send refill requests.   oxyCODONE (OXY IR/ROXICODONE) 5 MG immediate release tablet    Sig: Take 1 tablet (5 mg total) by mouth every 8 (eight) hours as needed for severe pain (pain score 7-10). Must last 30 days    Dispense:  90 tablet    Refill:  0    Not a duplicate. Do NOT delete! Dispense 1 day early if closed on refill date. Avoid benzodiazepines within 8 hours of opioids. Do not send refill requests.   oxyCODONE (OXY IR/ROXICODONE) 5 MG immediate release tablet    Sig: Take 1 tablet (5 mg total) by mouth every 8 (eight) hours as needed for severe pain (pain score 7-10). Must last 30 days    Dispense:  90 tablet    Refill:  0    Not a duplicate. Do NOT delete! Dispense 1 day early if closed on refill date. Avoid benzodiazepines within 8 hours of opioids. Do not send refill requests.   Orders:  Orders Placed This Encounter  Procedures   MR CERVICAL SPINE WO CONTRAST    Patient presents with axial pain with possible radicular component. Please assist [place] in identifying specific level(s) and laterality of any additional findings such as: 1. Facet (Zygapophyseal) joint DJD (Hypertrophy, space narrowing, subchondral sclerosis, and/or osteophyte formation) 2. DDD and/or IVDD (Loss of disc height, desiccation, gas patterns, osteophytes, endplate sclerosis, or "Black disc disease") 3. Pars defects 4. Spondylolisthesis, spondylosis, and/or spondyloarthropathies (include Degree/Grade of displacement in mm) (stability) 5. Vertebral body Fractures (acute/chronic) (state percentage of collapse) 6. Demineralization (osteopenia/osteoporotic) 7. Bone pathology 8. Foraminal narrowing  9. Surgical changes 10. Central, Lateral Recess, and/or Foraminal Stenosis  (include AP diameter of stenosis in mm) 11. Surgical changes (hardware type, status, and presence of fibrosis) 12. Modic Type Changes (MRI only) 13. IVDD (Disc bulge, protrusion, herniation, extrusion) (Level, laterality, extent)    Standing Status:   Future    Expiration Date:   03/26/2024    Scheduling Instructions:     Please make sure that the patient understands that this needs to be done as soon as possible. Never have the patient do the imaging "just before the next appointment". Inform patient that having the imaging done within the [facility] will expedite the availability of the results and will provide      imaging availability to the requesting physician. In addition inform the patient that the imaging order has an expiration date and will not be  renewed if not done within the active period.    What is the patient's sedation requirement?:   No Sedation    Does the patient have a pacemaker or implanted devices?:   No    Preferred imaging location?:   [facility] (table limit-350lbs)    Call Results- Best Contact Number?:   [phone] [facility]    Radiology Contrast Protocol - do NOT remove file path:   [email or web address]   Ambulatory referral to Pain Clinic    Referral Priority:   Routine    Referral Type:   Consultation    Referral Reason:   Specialty Services Required    Referred to Provider:   [facility]    Requested Specialty:   Pain Medicine    Number of Visits Requested:   1   Schedule appointment    From now on all medication management appointments and refills should be scheduled with our Mid-Level Practitioner Randalyn Rhea, NP).    Scheduling Instructions:     Please switch medication management visits to Mid-Level Practitioner's schedule Randalyn Rhea, NP).   Follow-up plan:   Return in about 3 months (around 04/10/2024) for Eval-day  (M,W), (F2F), (MM), (NP (Patel's) MM schedule).     Interventional Therapies  Risk  Complexity Considerations:   Estimated body mass index is 60.59 kg/m as calculated from the following:   Height as of 06/18/21: 5\' 4"  (1.626 m).   Weight as of 06/18/21: 353 lb (160.1 kg).     NOTE: NO RFA until BMI less or equal to 35.  (Gastric bypass done on 06/03/2017) Iodine allergy  Contrast dye allergy  Shellfish allergy    Planned  Pending:   Ambulatory referral to interventional pain management practice for evaluation and possible bilateral lumbar facet medial branch radiofrequency ablation.  (Entered on 07/28/2023)   Under consideration:      Completed:   Therapeutic right L2-3 LESI x1 (04/29/2022) (100/100/60/60)  Therapeutic bilateral IA Monovisc knee inj. x1 (05/25/2022)  Therapeutic right trapezius TPI/MNB x1 (05/25/2022)  Therapeutic bilateral IA Zilretta knee inj. x4 (03/25/2022) (100/100/80/80)  Therapeutic bilateral IA Hyalgan knee inj. x16 (06/24/2020) (100/100/60/60)  Palliative/therapeutic bilateral lumbar facet MBB x9 (07/07/2023) (100/100/70/70)  Therapeutic right IA hip injection x3 (07/07/2017) (100/50/25/>50)  Therapeutic left IA shoulder (glenohumeral joint) injection x1 (05/31/2017) (50/0/100/100)    Completed by other providers:   Therapeutic bilateral THR (total hip replacements) (Dr. Rosita Kea) (Right: 11/27/2019) (Left: 07/29/2015)    Therapeutic  Palliative (PRN) options:   Palliative bilateral knee injections  Palliative bilateral lumbar facet MBBs    Pharmacotherapy  Nonopioid transferred 06/24/2020: Lyrica     Recent Visits No visits were found meeting these conditions. Showing recent visits within past 90 days and meeting all other requirements Today's Visits Date Type Provider Dept  12/26/23 Office Visit Delano Metz, MD [facility]  Showing today's visits and meeting all other requirements Future Appointments No visits were found meeting  these conditions. Showing future appointments within next 90 days and meeting all other requirements  I discussed the assessment and treatment plan with the patient. The patient was provided an opportunity to ask questions and all were answered. The patient agreed with the plan and demonstrated an understanding of the instructions.  Patient advised to call back or seek an in-person evaluation if the symptoms or condition worsens.  Duration of encounter: 34 minutes.  Total time on encounter, as per AMA guidelines  included both the face-to-face and non-face-to-face time personally spent by the physician and/or other qualified health care professional(s) on the day of the encounter (includes time in activities that require the physician or other qualified health care professional and does not include time in activities normally performed by clinical staff). Physician's time may include the following activities when performed: Preparing to see the patient (e.g., pre-charting review of records, searching for previously ordered imaging, lab work, and nerve conduction tests) Review of prior analgesic pharmacotherapies. Reviewing PMP Interpreting ordered tests (e.g., lab work, imaging, nerve conduction tests) Performing post-procedure evaluations, including interpretation of diagnostic procedures Obtaining and/or reviewing separately obtained history Performing a medically appropriate examination and/or evaluation Counseling and educating the patient/family/caregiver Ordering medications, tests, or procedures Referring and communicating with other health care professionals (when not separately reported) Documenting clinical information in the electronic or other health record Independently interpreting results (not separately reported) and communicating results to the patient/ family/caregiver Care coordination (not separately reported)  Note by: Candi Chafe, MD (TTS and AI technology used. I  apologize for any typographical errors that were not detected and corrected.) Date: 12/26/2023; Time: 11:54 AM

## 2023-12-26 NOTE — Progress Notes (Signed)
 Nursing Pain Medication Assessment:  Safety precautions to be maintained throughout the outpatient stay will include: orient to surroundings, keep bed in low position, maintain call bell within reach at all times, provide assistance with transfer out of bed and ambulation.  Medication Inspection Compliance: Pill count conducted under aseptic conditions, in front of the patient. Neither the pills nor the bottle was removed from the patient's sight at any time. Once count was completed pills were immediately returned to the patient in their original bottle.  Medication: Oxycodone IR Pill/Patch Count:  36 of 90 pills remain Pill/Patch Appearance: Markings consistent with prescribed medication Bottle Appearance: Standard pharmacy container. Clearly labeled. Filled Date: 3 / 63 / 2025 Last Medication intake:  TodaySafety precautions to be maintained throughout the outpatient stay will include: orient to surroundings, keep bed in low position, maintain call bell within reach at all times, provide assistance with transfer out of bed and ambulation.

## 2023-12-26 NOTE — Assessment & Plan Note (Signed)
 B12 is pending Continue B12 oral supplementation 2 times per week

## 2023-12-26 NOTE — Progress Notes (Signed)
 Hematology/Oncology Progress note [phone] [phone]    CHIEF COMPLAINTS/REASON FOR VISIT:  Follow up  iron deficiency anemia  ASSESSMENT & PLAN:   Iron deficiency anemia, unspecified #Iron deficiency anemia  Labs reviewed and discussed with patient Lab Results  Component Value Date   HGB 13.0 12/26/2023   TIBC 455 (H) 12/26/2023   IRONPCTSAT 16 12/26/2023   FERRITIN 36 12/26/2023     Patient has stable hemoglobin and iron panel. Proceed with maintenance Venofer 200 mg x 1 today.  History of gastric bypass B12 is pending Continue B12 oral supplementation 2 times per week   Orders Placed This Encounter  Procedures   Vitamin B12    Standing Status:   Future    Expected Date:   06/26/2024    Expiration Date:   12/25/2024   Ferritin    Standing Status:   Future    Expected Date:   06/26/2024    Expiration Date:   12/25/2024   Iron and TIBC    Standing Status:   Future    Expected Date:   06/26/2024    Expiration Date:   12/25/2024   CBC with Differential (Cancer Center Only)    Standing Status:   Future    Expected Date:   06/26/2024    Expiration Date:   12/25/2024   Follow-up in 6 months. All questions were answered. The patient knows to call the clinic with any problems, questions or concerns.  Rickard Patience, MD, PhD [facility] Hematology Oncology 12/26/2023    HISTORY OF PRESENTING ILLNESS:  Karen Lewis is a  53 y.o.  female with PMH listed below was seen in consultation at the request of Marguarite Arbour, MD   for evaluation of iron deficiency anemia.   Reviewed patient's recent labs  Labs revealed anemia with hemoglobin of .   Reviewed patient's previous labs  05/16/2019 CBC showed hemoglobin 9.4, WBC 11.4, MCV 68.7, platelet count 352,000. Iron panel showed ferritin of 8, iron 34, folate 19.3, No aggravating or improving factors.  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  She follows up with ID Dr. Noralee Space.  +Chronic lower extremity swelling/lymphedema. Moved from [place] years ago.  Previously worked as a Engineer, civil (consulting).  INTERVAL HISTORY Karen Lewis is a 53 y.o. female who has above history reviewed by me today presents for follow up visit for management of iron deficiency anemia Patient receives maintenance Venofer every 6 months. Patient has chronic fatigue.  No blood in the stool, abdominal pain.   Review of Systems  Constitutional:  Positive for fatigue. Negative for appetite change, chills and fever.  HENT:   Negative for hearing loss and voice change.   Eyes:  Negative for eye problems.  Respiratory:  Negative for chest tightness, cough and shortness of breath.   Cardiovascular:  Positive for leg swelling. Negative for chest pain.  Gastrointestinal:  Negative for abdominal distention, abdominal pain and blood in stool.  Endocrine: Negative for hot flashes.  Genitourinary:  Negative for difficulty urinating and frequency.   Musculoskeletal:  Positive for arthralgias and back pain.  Skin:  Negative for itching and rash.  Neurological:  Negative for extremity weakness.  Hematological:  Negative for adenopathy.  Psychiatric/Behavioral:  Negative for confusion.     MEDICAL HISTORY:  Past Medical History:  Diagnosis Date   Anemia    Anginal pain (HCC)    Anxiety    Arthralgia  of hip 07/29/2015   Arthritis    Arthritis, degenerative 07/29/2015   Asthma    Cephalalgia 07/25/2014   Dependence on unknown drug (HCC)    multiplt controlled drug dependence   Depression    Diabetes mellitus without complication (HCC)    Difficult intubation    per pt needs small tube (has had abrasions from tube in past)   Dysrhythmia    PVC's   Eczema    Fibromyalgia    Gastritis    GERD (gastroesophageal reflux disease)    Gonalgia 07/29/2015   Overview:  Overview:  The patient has had bilateral intra-articular Hyalgan injections done on  07/16/2014 and although she seems to do well with this type of therapy, apparently her insurance company does not want to pay for they Hyalgan. On 11/27/2014 the patient underwent a bilateral genicular nerve block with excellent results. On 01/28/2015 she had a right knee genicular radiofrequency ablatio   Gout    H/O cardiovascular disorder 03/10/2015   H/O surgical procedure 12/05/2012   Overview:  LSG ([facility] - April 2013)     H/O thyroid disease 03/10/2015   Headache    Herpes    History of artificial joint 07/29/2015   History of hiatal hernia    Hypertension    Hypomagnesemia    Hypothyroidism    IDA (iron deficiency anemia) 05/28/2019   LBP (low back pain) 07/29/2015   Neuromuscular disorder (HCC)    Obesity    PCOS (polycystic ovarian syndrome)    Primary osteoarthritis of both knees 07/29/2015   Sleep apnea    not using a Cpap machine at this time - most recent test mild apnea does not qualify for test (not OSA)   Thyroid nodule bilateral   Umbilical hernia     SURGICAL HISTORY: Past Surgical History:  Procedure Laterality Date   APPENDECTOMY     CARPAL TUNNEL RELEASE Bilateral    CHOLECYSTECTOMY     COLONOSCOPY WITH PROPOFOL N/A 02/11/2021   Procedure: COLONOSCOPY WITH PROPOFOL;  Surgeon: Toledo, Alphonsus Jeans, MD;  Location: [facility];  Service: Gastroenterology;  Laterality: N/A;   DIAGNOSTIC LAPAROSCOPY     ESOPHAGOGASTRODUODENOSCOPY (EGD) WITH PROPOFOL N/A 02/11/2021   Procedure: ESOPHAGOGASTRODUODENOSCOPY (EGD) WITH PROPOFOL;  Surgeon: Toledo, Alphonsus Jeans, MD;  Location: [facility];  Service: Gastroenterology;  Laterality: N/A;   HIATAL HERNIA REPAIR     JOINT REPLACEMENT Bilateral hip   LAPAROSCOPIC PARTIAL GASTRECTOMY     left trigger finger     peniculectomy N/A 07/05/2018   ROUX-EN-Y GASTRIC BYPASS  06/03/2017   SHOULDER ARTHROSCOPY Right    THYROIDECTOMY N/A 11/12/2015   Procedure: THYROIDECTOMY;  Surgeon: Von Grumbling, MD;  Location: [facility] OR[facility];  Service:  ENT;  Laterality: N/A;   TOTAL HIP ARTHROPLASTY Right 11/27/2019   Procedure: TOTAL HIP ARTHROPLASTY ANTERIOR APPROACH;  Surgeon: Molli Angelucci, MD;  Location: [facility] OR[facility];  Service: Orthopedics;  Laterality: Right;   TRIGGER FINGER RELEASE Right    TRIGGER FINGER RELEASE Right 04/24/2020   Procedure: Right ring and middle trigger release;  Surgeon: Molli Angelucci, MD;  Location: [facility] OR[facility];  Service: Orthopedics;  Laterality: Right;    SOCIAL HISTORY: Social History   Socioeconomic History   Marital status: Married    Spouse name: Not on file   Number of children: 0   Years of education: 12   Highest education level: High school graduate  Occupational History   Occupation: Disabled  Tobacco Use   Smoking status: Never   Smokeless tobacco:  Never  Vaping Use   Vaping status: Never Used  Substance and Sexual Activity   Alcohol use: No    Alcohol/week: 0.0 standard drinks of alcohol   Drug use: No   Sexual activity: Not Currently    Birth control/protection: Injection  Other Topics Concern   Not on file  Social History Narrative   Not on file   Social Drivers of Health   Financial Resource Strain: Low Risk  (11/16/2023)   Received from [name]   Overall Financial Resource Strain (CARDIA)    Difficulty of Paying Living Expenses: Not hard at all  Food Insecurity: No Food Insecurity (11/16/2023)   Received [facility]   Hunger Vital Sign    Worried About Running Out of Food in the Last Year: Never true    Ran Out of Food in the Last Year: Never true  Transportation Needs: No Transportation Needs (11/16/2023)   Received from [facility] - Transportation    In the past 12 months, has lack of transportation kept you from medical appointments or from getting medications?: No    Lack of Transportation (Non-Medical): No  Physical Activity: Inactive (12/14/2019)   Exercise Vital Sign    Days of Exercise per Week: 0 days     Minutes of Exercise per Session: 0 min  Stress: No Stress Concern Present (01/20/2022)   [employer] of Occupational Health - Occupational Stress Questionnaire    Feeling of Stress : Only a little  Social Connections: Moderately Integrated (01/20/2022)   Social Connection and Isolation Panel [NHANES]    Frequency of Communication with Friends and Family: More than three times a week    Frequency of Social Gatherings with Friends and Family: More than three times a week    Attends Religious Services: More than 4 times per year    Active Member of [employer] or Organizations: No    Attends Banker Meetings: Never    Marital Status: Married  Catering manager Violence: Not At Risk (12/14/2019)   Humiliation, Afraid, Rape, and Kick questionnaire    Fear of Current or Ex-Partner: No    Emotionally Abused: No    Physically Abused: No    Sexually Abused: No    FAMILY HISTORY: Family History  Problem Relation Age of Onset   Anxiety disorder Mother    Depression Mother    Alcohol abuse Mother    Diabetes Mother    Hypertension Mother    Kidney cancer Mother    Sleep apnea Mother    Alcohol abuse Father    Anxiety disorder Father    Depression Father    Post-traumatic stress disorder Father    Kidney failure Father    COPD Father    Diabetes Father    Hypertension Father    Sleep apnea Father    Depression Brother    Diabetes Brother    Hypertension Brother    Sleep apnea Brother    Breast cancer Paternal Aunt 35   Breast cancer Paternal Aunt 102   Brain cancer Paternal Uncle 68   Bladder Cancer Neg Hx    Prostate cancer Neg Hx     ALLERGIES:  is allergic to bactrim [sulfamethoxazole-trimethoprim], omalizumab, ciprofloxacin, shellfish allergy, and nsaids.  MEDICATIONS:  Current Outpatient Medications  Medication Sig Dispense Refill   ACCU-CHEK AVIVA PLUS test strip      acetaminophen (TYLENOL) 650 MG CR tablet Take 650-1,300 mg by mouth every  8 (eight) hours  as needed for pain.     acidophilus (RISAQUAD) CAPS capsule Take 1 capsule by mouth daily.     albuterol (PROVENTIL HFA;VENTOLIN HFA) 108 (90 BASE) MCG/ACT inhaler Inhale 2 puffs into the lungs every 6 (six) hours as needed for wheezing or shortness of breath.     Alcohol Swabs (ALCOHOL PREP) 70 % PADS Apply topically.     amitriptyline (ELAVIL) 75 MG tablet Take 100 mg by mouth at bedtime.     Biotin 10 MG CAPS Take 10 mg by mouth daily.      butalbital-acetaminophen-caffeine (FIORICET) 50-325-40 MG tablet Take 1 tablet by mouth every 6 (six) hours as needed for migraine.      calcium carbonate (TUMS EX) 750 MG chewable tablet Chew 1 tablet by mouth daily.      clonazePAM (KLONOPIN) 0.5 MG tablet Take 0.5 mg by mouth 2 (two) times daily as needed for anxiety.     diclofenac sodium (VOLTAREN) 1 % GEL Apply 2 g topically 4 (four) times daily. (Patient taking differently: Apply 2 g topically 3 (three) times daily as needed (pain).) 100 g 1   diphenhydrAMINE (BENADRYL) 25 MG tablet Take 25 mg by mouth every 8 (eight) hours as needed for itching or allergies.      DULoxetine (CYMBALTA) 60 MG capsule Take 60 mg by mouth daily.     ergocalciferol (VITAMIN D2) 1.25 MG (50000 UT) capsule Take 50,000 Units by mouth 2 (two) times a week.      famotidine (PEPCID) 20 MG tablet Take 20 mg by mouth 2 (two) times daily.     Galcanezumab-gnlm (EMGALITY) 120 MG/ML SOAJ Inject 120 mg into the skin every 30 (thirty) days.     glucose blood (ACCU-CHEK AVIVA PLUS) test strip Use 2 (two) times daily     levocetirizine (XYZAL) 5 MG tablet Take 5 mg by mouth at bedtime.      levothyroxine (SYNTHROID) 125 MCG tablet Take 125 mcg by mouth every morning.     lisinopril (ZESTRIL) 5 MG tablet Take 5 mg by mouth daily.     magnesium oxide (MAG-OX) 400 MG tablet Take 1,200 mg by mouth 2 (two) times daily. Take 1200 mg by mouth in the morning and 1200 mg at bedtime     meclizine (ANTIVERT) 25 MG tablet Take 25 mg by mouth 3  (three) times daily as needed for dizziness.      metFORMIN (GLUCOPHAGE-XR) 500 MG 24 hr tablet Take 1,000 mg by mouth 2 (two) times daily.     metoprolol tartrate (LOPRESSOR) 50 MG tablet Take 50 mg by mouth in the morning, at noon, and at bedtime. 50 mg TID and an additonal 50mg  if needed     montelukast (SINGULAIR) 10 MG tablet Take 10 mg by mouth at bedtime.      Multiple Vitamin (TAB-A-VITE) TABS Take 1 tablet by mouth daily.     naphazoline-pheniramine (NAPHCON-A) 0.025-0.3 % ophthalmic solution Place 1 drop into both eyes 4 (four) times daily as needed for eye irritation.     nitrofurantoin (MACRODANTIN) 100 MG capsule Take 1 capsule (100 mg total) by mouth 4 (four) times daily. (Patient taking differently: Take 100 mg by mouth daily.) 30 capsule 11   omeprazole (PRILOSEC) 40 MG capsule Take 40 mg by mouth in the morning and at bedtime.     [START ON 01/11/2024] oxyCODONE (OXY IR/ROXICODONE) 5 MG immediate release tablet Take 1 tablet (5 mg total) by mouth every 8 (eight) hours  as needed for severe pain (pain score 7-10). Must last 30 days 90 tablet 0   OZEMPIC, 0.25 OR 0.5 MG/DOSE, 2 MG/1.5ML SOPN Inject 1 mg into the skin once a week.     phentermine (ADIPEX-P) 37.5 MG tablet Take 37.5 mg by mouth daily before breakfast.     pregabalin (LYRICA) 225 MG capsule Take 225 mg by mouth 2 (two) times daily.     QUEtiapine (SEROQUEL) 50 MG tablet Take 50 mg by mouth at bedtime.     rizatriptan (MAXALT) 10 MG tablet Take 1 and may repeat in 2 hours for MIgraines, alternate with the disintrgrating tablet.     rizatriptan (MAXALT-MLT) 10 MG disintegrating tablet Take 10 mg by mouth. Take 1 and may repeat in 2 hours  for Migraines     rosuvastatin (CRESTOR) 40 MG tablet Take 40 mg by mouth daily.      tiZANidine (ZANAFLEX) 4 MG tablet Take 1 tablet (4 mg total) by mouth 2 (two) times daily as needed for muscle spasms. 30 tablet 0   topiramate (TOPAMAX) 100 MG tablet Take 100 mg by mouth 2 (two) times  daily.     EPINEPHrine 0.3 mg/0.3 mL IJ SOAJ injection Inject 0.3 mg into the muscle as needed for anaphylaxis. (Patient not taking: Reported on 12/26/2023)     naloxone [facility]) nasal spray 4 mg/0.1 mL Place 1 spray into the nose as needed for up to 365 doses (for opioid-induced respiratory depresssion). In case of emergency (overdose), spray once into each nostril. If no response within 3 minutes, repeat application and call 911. (Patient not taking: Reported on 12/26/2023) 1 each 1   [START ON 02/10/2024] oxyCODONE (OXY IR/ROXICODONE) 5 MG immediate release tablet Take 1 tablet (5 mg total) by mouth every 8 (eight) hours as needed for severe pain (pain score 7-10). Must last 30 days (Patient not taking: Reported on 12/26/2023) 90 tablet 0   [START ON 03/11/2024] oxyCODONE (OXY IR/ROXICODONE) 5 MG immediate release tablet Take 1 tablet (5 mg total) by mouth every 8 (eight) hours as needed for severe pain (pain score 7-10). Must last 30 days (Patient not taking: Reported on 12/26/2023) 90 tablet 0   No current facility-administered medications for this visit.     PHYSICAL EXAMINATION: ECOG PERFORMANCE STATUS: 1 - Symptomatic but completely ambulatory Vitals:   12/26/23 1409  BP: 93/62  Pulse: 91  Resp: 18  Temp: 97.9 F (36.6 C)   Filed Weights   12/26/23 1409  Weight: (!) 337 lb (152.9 kg)     Physical Exam Constitutional:      General: She is not in acute distress.    Appearance: She is obese.  HENT:     Head: Normocephalic and atraumatic.  Eyes:     General: No scleral icterus.    Pupils: Pupils are equal, round, and reactive to light.  Cardiovascular:     Rate and Rhythm: Normal rate and regular rhythm.  Pulmonary:     Effort: Pulmonary effort is normal. No respiratory distress.     Breath sounds: No wheezing.  Abdominal:     General: Bowel sounds are normal.     Palpations: Abdomen is soft.  Musculoskeletal:        General: Normal range of motion.     Cervical back:  Normal range of motion and neck supple.  Skin:    General: Skin is warm and dry.  Neurological:     Mental Status: She is alert and oriented to person,  place, and time. Mental status is at baseline.     Cranial Nerves: No cranial nerve deficit.  Psychiatric:        Mood and Affect: Mood normal.     LABORATORY DATA:  I have reviewed the data as listed    Latest Ref Rng & Units 12/26/2023   12:15 PM 06/27/2023   11:03 AM 11/24/2022   10:08 AM  CBC  WBC 4.0 - 10.5 K/uL 7.9  8.8  14.8   Hemoglobin 12.0 - 15.0 g/dL 16.1  09.6  04.5   Hematocrit 36.0 - 46.0 % 40.7  41.2  46.6   Platelets 150 - 400 K/uL 312  315  354       Latest Ref Rng & Units 08/09/2022    4:18 AM 08/08/2022    3:51 AM 08/07/2022    3:37 PM  CMP  Glucose 70 - 99 mg/dL 409  811  96   BUN 6 - 20 mg/dL 17  31  39   Creatinine 0.44 - 1.00 mg/dL 9.14  7.82  9.56   Sodium 135 - 145 mmol/L 142  140  135   Potassium 3.5 - 5.1 mmol/L 4.8  4.9  5.3   Chloride 98 - 111 mmol/L 116  113  108   CO2 22 - 32 mmol/L 21  19  18    Calcium 8.9 - 10.3 mg/dL 7.8  7.3  7.3   Total Protein 6.5 - 8.1 g/dL   6.2   Total Bilirubin 0.3 - 1.2 mg/dL   0.5   Alkaline Phos 38 - 126 U/L   74   AST 15 - 41 U/L   25   ALT 0 - 44 U/L   40    Iron/TIBC/Ferritin/ %Sat    Component Value Date/Time   IRON 72 12/26/2023 1215   TIBC 455 (H) 12/26/2023 1215   FERRITIN 36 12/26/2023 1215   IRONPCTSAT 16 12/26/2023 1215     No results found.

## 2023-12-26 NOTE — Assessment & Plan Note (Signed)
#  Iron deficiency anemia  Labs reviewed and discussed with patient Lab Results  Component Value Date   HGB 13.0 12/26/2023   TIBC 455 (H) 12/26/2023   IRONPCTSAT 16 12/26/2023   FERRITIN 36 12/26/2023     Patient has stable hemoglobin and iron panel. Proceed with maintenance Venofer 200 mg x 1 today.

## 2023-12-26 NOTE — Patient Instructions (Signed)
 ______________________________________________________________________    New Medication Management Provider - Randalyn Rhea, NP  Purpose: To inform patients of the addition of a new member to our group, Randalyn Rhea, NP.  Applies to: All patients receiving prescriptions from our practice (written or electronic).  Announcement: We are happy to announce the addition of Randalyn Rhea, NP to or practice ([facility]).  She will take up a support role assisting our interventional pain specialists in the management of our patients.  She will be primarily assigned to the medication management portion of our practice.  Physician assignment: Patient will continue to be assigned to their current Pain Specialist Physician however, Ms. Allena Katz, NP will take over the Medication Management visits along with the responsibilities associated with such visits.  Medication Management: Any questions or inquiries associated with the day-to-day management of your pain medications, refills, or anything else associated with those prescriptions should be directed to Ms. Randalyn Rhea, NP.  Interventional Therapies: All issues associated with these therapies will continue to be managed by your Primary Pain Specialist.   Requesting appointments: When requesting that appointment, please make sure to specify whether the appointment is for Medication Management (to be scheduled with Ms. Allena Katz, NP) or if an evaluation is required/desired with your Primary Pain Specialist.  (Last updated: 12/13/2023) ______________________________________________________________________     ______________________________________________________________________    Procedure instructions  Stop blood-thinners  Do not eat or drink fluids (other than water) for 6 hours before your procedure  No water for 2 hours before your procedure  Take your blood pressure medicine with a sip of  water  Arrive 30 minutes before your appointment  If sedation is planned, bring suitable driver. Pennie Banter, Benedetto Goad, & public transportation are NOT APPROVED)  Carefully read the "Preparing for your procedure" detailed instructions  If you have questions call us at [phone]  Procedure appointments are for procedures only. NO medication refills or new problem evaluations.   ______________________________________________________________________      ______________________________________________________________________    Preparing for your procedure  Appointments: If you think you may not be able to keep your appointment, call 24-48 hours in advance to cancel. We need time to make it available to others.  Procedure visits are for procedures only. During your procedure appointment there will be: NO Prescription Refills*. NO medication changes or discussions*. NO discussion of disability issues*. NO unrelated pain problem evaluations*. NO evaluations to order other pain procedures*. *These will be addressed at a separate and distinct evaluation encounter on the provider's evaluation schedule and not during procedure days.  Instructions: Food intake: Avoid eating anything solid for at least 8 hours prior to your procedure. Clear liquid intake: You may take clear liquids such as water up to 2 hours prior to your procedure. (No carbonated drinks. No soda.) Transportation: Unless otherwise stated by your physician, bring a driver. (Driver cannot be a Market researcher, Pharmacist, community, or any other form of public transportation.) Morning Medicines: Except for blood thinners, take all of your other morning medications with a sip of water. Make sure to take your heart and blood pressure medicines. If your blood pressure's lower number is above 100, the case will be rescheduled. Blood thinners: Make sure to stop your blood thinners as instructed.  If you take a blood thinner, but were not instructed to stop it, call  our office [phone] and ask to talk to a nurse. Not stopping a blood thinner prior to certain procedures could lead to serious complications. Diabetics on  insulin: Notify the staff so that you can be scheduled 1st case in the morning. If your diabetes requires high dose insulin, take only  of your normal insulin dose the morning of the procedure and notify the staff that you have done so. Preventing infections: Shower with an antibacterial soap the morning of your procedure.  Build-up your immune system: Take 1000 mg of Vitamin C with every meal (3 times a day) the day prior to your procedure. Antibiotics: Inform the nursing staff if you are taking any antibiotics or if you have any conditions that may require antibiotics prior to procedures. (Example: recent joint implants)   Pregnancy: If you are pregnant make sure to notify the nursing staff. Not doing so may result in injury to the fetus, including death.  Sickness: If you have a cold, fever, or any active infections, call and cancel or reschedule your procedure. Receiving steroids while having an infection may result in complications. Arrival: You must be in the facility at least 30 minutes prior to your scheduled procedure. Tardiness: Your scheduled time is also the cutoff time. If you do not arrive at least 15 minutes prior to your procedure, you will be rescheduled.  Children: Do not bring any children with you. Make arrangements to keep them home. Dress appropriately: There is always a possibility that your clothing may get soiled. Avoid long dresses. Valuables: Do not bring any jewelry or valuables.  Reasons to call and reschedule or cancel your procedure: (Following these recommendations will minimize the risk of a serious complication.) Surgeries: Avoid having procedures within 2 weeks of any surgery. (Avoid for 2 weeks before or after any surgery). Flu Shots: Avoid having procedures within 2 weeks of a flu shots or . (Avoid for 2  weeks before or after immunizations). Barium: Avoid having a procedure within 7-10 days after having had a radiological study involving the use of radiological contrast. (Myelograms, Barium swallow or enema study). Heart attacks: Avoid any elective procedures or surgeries for the initial 6 months after a "Myocardial Infarction" (Heart Attack). Blood thinners: It is imperative that you stop these medications before procedures. Let us know if you if you take any blood thinner.  Infection: Avoid procedures during or within two weeks of an infection (including chest colds or gastrointestinal problems). Symptoms associated with infections include: Localized redness, fever, chills, night sweats or profuse sweating, burning sensation when voiding, cough, congestion, stuffiness, runny nose, sore throat, diarrhea, nausea, vomiting, cold or Flu symptoms, recent or current infections. It is specially important if the infection is over the area that we intend to treat. Heart and lung problems: Symptoms that may suggest an active cardiopulmonary problem include: cough, chest pain, breathing difficulties or shortness of breath, dizziness, ankle swelling, uncontrolled high or unusually low blood pressure, and/or palpitations. If you are experiencing any of these symptoms, cancel your procedure and contact your primary care physician for an evaluation.  Remember:  Regular Business hours are:  Monday to Thursday 8:00 AM to 4:00 PM  Provider's Schedule: Delano Metz, MD:  Procedure days: Tuesday and Thursday 7:30 AM to 4:00 PM  Edward Jolly, MD:  Procedure days: Monday and Wednesday 7:30 AM to 4:00 PM Last  Updated: 08/23/2023 ______________________________________________________________________      ______________________________________________________________________    General Risks and Possible Complications  Patient Responsibilities: It is important that you read this as it is part of your  informed consent. It is our duty to inform you of the risks and possible complications associated with  treatments offered to you. It is your responsibility as a patient to read this and to ask questions about anything that is not clear or that you believe was not covered in this document.  Patient's Rights: You have the right to refuse treatment. You also have the right to change your mind, even after initially having agreed to have the treatment done. However, under this last option, if you wait until the last second to change your mind, you may be charged for the materials used up to that point.  Introduction: Medicine is not an Visual merchandiser. Everything in Medicine, including the lack of treatment(s), carries the potential for danger, harm, or loss (which is by definition: Risk). In Medicine, a complication is a secondary problem, condition, or disease that can aggravate an already existing one. All treatments carry the risk of possible complications. The fact that a side effects or complications occurs, does not imply that the treatment was conducted incorrectly. It must be clearly understood that these can happen even when everything is done following the highest safety standards.  No treatment: You can choose not to proceed with the proposed treatment alternative. The "PRO(s)" would include: avoiding the risk of complications associated with the therapy. The "CON(s)" would include: not getting any of the treatment benefits. These benefits fall under one of three categories: diagnostic; therapeutic; and/or palliative. Diagnostic benefits include: getting information which can ultimately lead to improvement of the disease or symptom(s). Therapeutic benefits are those associated with the successful treatment of the disease. Finally, palliative benefits are those related to the decrease of the primary symptoms, without necessarily curing the condition (example: decreasing the pain from a flare-up of a  chronic condition, such as incurable terminal cancer).  General Risks and Complications: These are associated to most interventional treatments. They can occur alone, or in combination. They fall under one of the following six (6) categories: no benefit or worsening of symptoms; bleeding; infection; nerve damage; allergic reactions; and/or death. No benefits or worsening of symptoms: In Medicine there are no guarantees, only probabilities. No healthcare provider can ever guarantee that a medical treatment will work, they can only state the probability that it may. Furthermore, there is always the possibility that the condition may worsen, either directly, or indirectly, as a consequence of the treatment. Bleeding: This is more common if the patient is taking a blood thinner, either prescription or over the counter (example: Goody Powders, Fish oil, Aspirin, Garlic, etc.), or if suffering a condition associated with impaired coagulation (example: Hemophilia, cirrhosis of the liver, low platelet counts, etc.). However, even if you do not have one on these, it can still happen. If you have any of these conditions, or take one of these drugs, make sure to notify your treating physician. Infection: This is more common in patients with a compromised immune system, either due to disease (example: diabetes, cancer, human immunodeficiency virus [HIV], etc.), or due to medications or treatments (example: therapies used to treat cancer and rheumatological diseases). However, even if you do not have one on these, it can still happen. If you have any of these conditions, or take one of these drugs, make sure to notify your treating physician. Nerve Damage: This is more common when the treatment is an invasive one, but it can also happen with the use of medications, such as those used in the treatment of cancer. The damage can occur to small secondary nerves, or to large primary ones, such as those in the spinal cord  and  brain. This damage may be temporary or permanent and it may lead to impairments that can range from temporary numbness to permanent paralysis and/or brain death. Allergic Reactions: Any time a substance or material comes in contact with our body, there is the possibility of an allergic reaction. These can range from a mild skin rash (contact dermatitis) to a severe systemic reaction (anaphylactic reaction), which can result in death. Death: In general, any medical intervention can result in death, most of the time due to an unforeseen complication. ______________________________________________________________________       ______________________________________________________________________    Opioid Pain Medication Update  To: All patients taking opioid pain medications. (I.e.: hydrocodone, hydromorphone, oxycodone, oxymorphone, morphine, codeine, methadone, tapentadol, tramadol, buprenorphine, fentanyl, etc.)  Re: Updated review of side effects and adverse reactions of opioid analgesics, as well as new information about long term effects of this class of medications.  Direct risks of long-term opioid therapy are not limited to opioid addiction and overdose. Potential medical risks include serious fractures, breathing problems during sleep, hyperalgesia, immunosuppression, chronic constipation, bowel obstruction, myocardial infarction, and tooth decay secondary to xerostomia.  Unpredictable adverse effects that can occur even if you take your medication correctly: Cognitive impairment, respiratory depression, and death. Most people think that if they take their medication "correctly", and "as instructed", that they will be safe. Nothing could be farther from the truth. In reality, a significant amount of recorded deaths associated with the use of opioids has occurred in individuals that had taken the medication for a long time, and were taking their medication correctly. The following are  examples of how this can happen: Patient taking his/her medication for a long time, as instructed, without any side effects, is given a certain antibiotic or another unrelated medication, which in turn triggers a "Drug-to-drug interaction" leading to disorientation, cognitive impairment, impaired reflexes, respiratory depression or an untoward event leading to serious bodily harm or injury, including death.  Patient taking his/her medication for a long time, as instructed, without any side effects, develops an acute impairment of liver and/or kidney function. This will lead to a rapid inability of the body to breakdown and eliminate their pain medication, which will result in effects similar to an "overdose", but with the same medicine and dose that they had always taken. This again may lead to disorientation, cognitive impairment, impaired reflexes, respiratory depression or an untoward event leading to serious bodily harm or injury, including death.  A similar problem will occur with patients as they grow older and their liver and kidney function begins to decrease as part of the aging process.  Background information: Historically, the original case for using long-term opioid therapy to treat chronic noncancer pain was based on safety assumptions that subsequent experience has called into question. In 1996, the American Pain Society and the American Academy of Pain Medicine issued a consensus statement supporting long-term opioid therapy. This statement acknowledged the dangers of opioid prescribing but concluded that the risk for addiction was low; respiratory depression induced by opioids was short-lived, occurred mainly in opioid-naive patients, and was antagonized by pain; tolerance was not a common problem; and efforts to control diversion should not constrain opioid prescribing. This has now proven to be wrong. Experience regarding the risks for opioid addiction, misuse, and overdose in community  practice has failed to support these assumptions.  According to the Centers for Disease Control and Prevention, fatal overdoses involving opioid analgesics have increased sharply over the past decade. Currently, more than 96,700  people die from drug overdoses every year. Opioids are a factor in 7 out of every 10 overdose deaths. Deaths from drug overdose have surpassed motor vehicle accidents as the leading cause of death for individuals between the ages of 74 and 78.  Clinical data suggest that neuroendocrine dysfunction may be very common in both men and women, potentially causing hypogonadism, erectile dysfunction, infertility, decreased libido, osteoporosis, and depression. Recent studies linked higher opioid dose to increased opioid-related mortality. Controlled observational studies reported that long-term opioid therapy may be associated with increased risk for cardiovascular events. Subsequent meta-analysis concluded that the safety of long-term opioid therapy in elderly patients has not been proven.   Side Effects and adverse reactions: Common side effects: Drowsiness (sedation). Dizziness. Nausea and vomiting. Constipation. Physical dependence -- Dependence often manifests with withdrawal symptoms when opioids are discontinued or decreased. Tolerance -- As you take repeated doses of opioids, you require increased medication to experience the same effect of pain relief. Respiratory depression -- This can occur in healthy people, especially with higher doses. However, people with COPD, asthma or other lung conditions may be even more susceptible to fatal respiratory impairment.  Uncommon side effects: An increased sensitivity to feeling pain and extreme response to pain (hyperalgesia). Chronic use of opioids can lead to this. Delayed gastric emptying (the process by which the contents of your stomach are moved into your small intestine). Muscle rigidity. Immune system and hormonal  dysfunction. Quick, involuntary muscle jerks (myoclonus). Arrhythmia. Itchy skin (pruritus). Dry mouth (xerostomia).  Long-term side effects: Chronic constipation. Sleep-disordered breathing (SDB). Increased risk of bone fractures. Hypothalamic-pituitary-adrenal dysregulation. Increased risk of overdose.  RISKS: Respiratory depression and death: Opioids increase the risk of respiratory depression and death.  Drug-to-drug interactions: Opioids are relatively contraindicated in combination with benzodiazepines, sleep inducers, and other central nervous system depressants. Other classes of medications (i.e.: certain antibiotics and even over-the-counter medications) may also trigger or induce respiratory depression in some patients.  Medical conditions: Patients with pre-existing respiratory problems are at higher risk of respiratory failure and/or depression when in combination with opioid analgesics. Opioids are relatively contraindicated in some medical conditions such as central sleep apnea.   Fractures and Falls:  Opioids increase the risk and incidence of falls. This is of particular importance in elderly patients.  Endocrine System:  Long-term administration is associated with endocrine abnormalities (endocrinopathies). (Also known as Opioid-induced Endocrinopathy) Influences on both the hypothalamic-pituitary-adrenal axis?and the hypothalamic-pituitary-gonadal axis have been demonstrated with consequent hypogonadism and adrenal insufficiency in both sexes. Hypogonadism and decreased levels of dehydroepiandrosterone sulfate have been reported in men and women. Endocrine effects include: Amenorrhoea in women (abnormal absence of menstruation) Reduced libido in both sexes Decreased sexual function Erectile dysfunction in men Hypogonadisms (decreased testicular function with shrinkage of testicles) Infertility Depression and fatigue Loss of muscle mass Anxiety Depression Immune  suppression Hyperalgesia Weight gain Anemia Osteoporosis Patients (particularly women of childbearing age) should avoid opioids. There is insufficient evidence to recommend routine monitoring of asymptomatic patients taking opioids in the long-term for hormonal deficiencies.  Immune System: Human studies have demonstrated that opioids have an immunomodulating effect. These effects are mediated via opioid receptors both on immune effector cells and in the central nervous system. Opioids have been demonstrated to have adverse effects on antimicrobial response and anti-tumour surveillance. Buprenorphine has been demonstrated to have no impact on immune function.  Opioid Induced Hyperalgesia: Human studies have demonstrated that prolonged use of opioids can lead to a state of abnormal pain sensitivity, sometimes  called opioid induced hyperalgesia (OIH). Opioid induced hyperalgesia is not usually seen in the absence of tolerance to opioid analgesia. Clinically, hyperalgesia may be diagnosed if the patient on long-term opioid therapy presents with increased pain. This might be qualitatively and anatomically distinct from pain related to disease progression or to breakthrough pain resulting from development of opioid tolerance. Pain associated with hyperalgesia tends to be more diffuse than the pre-existing pain and less defined in quality. Management of opioid induced hyperalgesia requires opioid dose reduction.  Cancer: Chronic opioid therapy has been associated with an increased risk of cancer among noncancer patients with chronic pain. This association was more evident in chronic strong opioid users. Chronic opioid consumption causes significant pathological changes in the small intestine and colon. Epidemiological studies have found that there is a link between opium dependence and initiation of gastrointestinal cancers. Cancer is the second leading cause of death after cardiovascular disease.  Chronic use of opioids can cause multiple conditions such as GERD, immunosuppression and renal damage as well as carcinogenic effects, which are associated with the incidence of cancers.   Mortality: Long-term opioid use has been associated with increased mortality among patients with chronic non-cancer pain (CNCP).  Prescription of long-acting opioids for chronic noncancer pain was associated with a significantly increased risk of all-cause mortality, including deaths from causes other than overdose.  Reference: Von Korff M, Kolodny A, Deyo RA, Chou R. Long-term opioid therapy reconsidered. Ann Intern Med. 2011 Sep 6;155(5):325-8. doi: 10.7326/[id number]. PMID: 16109604; PMCID: [id number]. Randon Goldsmith, Hayward RA, Dunn KM, [place] KP. Risk of adverse events in patients prescribed long-term opioids: A cohort study in the [place] [facility] [employer]. Eur J Pain. 2019 May;23(5):908-922. doi: 10.1002/ejp.1357. Epub 2019 Jan 31. PMID: 78295621. Colameco S, Coren JS, Ciervo CA. Continuous opioid treatment for chronic noncancer pain: a time for moderation in prescribing. Postgrad Med. 2009 Jul;121(4):61-6. doi: 10.3810/pgm.2009.07.2032. PMID: 30865784. William Hamburger RN, Colton SD, Blazina I, Cristopher Peru, Bougatsos C, Deyo RA. The effectiveness and risks of long-term opioid therapy for chronic pain: a systematic review for a [employer] to [employer]. Ann Intern Med. 2015 Feb 17;162(4):276-86. doi: 10.7326/M14-2559. PMID: 69629528. Caryl Bis [facility], Makuc DM. NCHS Data Brief No. 22. [place]: Centers for Disease Control and Prevention; 2009. Sep, Increase in Fatal Poisonings Involving Opioid Analgesics in the [place], 1999-2006. Song IA, Choi HR, Oh TK. Long-term opioid use and mortality in patients with chronic non-cancer pain: Ten-year follow-up study in [place] from 2010 through 2019.  EClinicalMedicine. 2022 Jul 18;51:101558. doi: 10.1016/j.eclinm.2022.413244. PMID: 01027253; PMCID: [id number]. Huser, W., Schubert, T., Vogelmann, T. et al. All-cause mortality in patients with long-term opioid therapy compared with non-opioid analgesics for chronic non-cancer pain: a database study. BMC Med 18, 162 (2020). http://lester.info/ Rashidian H, Karie Kirks, Malekzadeh R, Haghdoost AA. An Ecological Study of the Association between Opiate Use and Incidence of Cancers. Addict Health. 2016 Fall;8(4):252-260. PMID: 59563875; PMCID: [id number].  Our Goal: Our goal is to control your pain with means other than the use of opioid pain medications.  Our Recommendation: Talk to your physician about coming off of these medications. We can assist you with the tapering down and stopping these medicines. Based on the new information, even if you cannot completely stop the medication, a decrease in the dose may be associated with a lesser risk. Ask for other means of controlling the pain. Decrease or eliminate those factors that significantly  contribute to your pain such as smoking, obesity, and a diet heavily tilted towards "inflammatory" nutrients.  Last Updated: 03/21/2023   ______________________________________________________________________       ______________________________________________________________________    National Pain Medication Shortage  The [place] is experiencing worsening drug shortages. These have had a negative widespread effect on patient care and treatment. Not expected to improve any time soon. Predicted to last past 2029.   Drug shortage list (generic names) Oxycodone IR Oxycodone/APAP Oxymorphone IR Hydromorphone Hydrocodone/APAP Morphine  Where is the problem?  Manufacturing and supply level.  Will this shortage affect you?  Only if you take any of the above pain medications.  How? You may be unable to fill your  prescription.  Your pharmacist may offer a "partial fill" of your prescription. (Warning: Do not accept partial fills.) Prescriptions partially filled cannot be transferred to another pharmacy. Read our Medication Rules and Regulation. Depending on how much medicine you are dependent on, you may experience withdrawals when unable to get the medication.  Recommendations: Consider ending your dependence on opioid pain medications. Ask your pain specialist to assist you with the process. Consider switching to a medication currently not in shortage, such as Buprenorphine. Talk to your pain specialist about this option. Consider decreasing your pain medication requirements by managing tolerance thru "Drug Holidays". This may help minimize withdrawals, should you run out of medicine. Control your pain thru the use of non-pharmacological interventional therapies.   Your prescriber: Prescribers cannot be blamed for shortages. Medication manufacturing and supply issues cannot be fixed by the prescriber.   NOTE: The prescriber is not responsible for supplying the medication, or solving supply issues. Work with your pharmacist to solve it. The patient is responsible for the decision to take or continue taking the medication and for identifying and securing a legal supply source. By law, supplying the medication is the job and responsibility of the pharmacy. The prescriber is responsible for the evaluation, monitoring, and prescribing of these medications.   Prescribers will NOT: Re-issue prescriptions that have been partially filled. Re-issue prescriptions already sent to a pharmacy.  Re-send prescriptions to a different pharmacy because yours did not have your medication. Ask pharmacist to order more medicine or transfer the prescription to another pharmacy. (Read below.)  New 2023 regulation: "May 14, 2022 Revised Regulation Allows DEA-Registered Pharmacies to Transfer Electronic Prescriptions at  a Patient's Request DEA Headquarters Division - Public Information Office Patients now have the ability to request their electronic prescription be transferred to another pharmacy without having to go back to their practitioner to initiate the request. This revised regulation went into effect on Monday, May 10, 2022.     At a patient's request, a DEA-registered retail pharmacy can now transfer an electronic prescription for a controlled substance (schedules II-V) to another DEA-registered retail pharmacy. Prior to this change, patients would have to go through their practitioner to cancel their prescription and have it re-issued to a different pharmacy. The process was taxing and time consuming for both patients and practitioners.    The Drug Enforcement Administration [facility]) published its intent to revise the process for transferring electronic prescriptions on August 01, 2020.  The final rule was published in the federal register on April 08, 2022 and went into effect 30 days later.  Under the final rule, a prescription can only be transferred once between pharmacies, and only if allowed under existing state or other applicable law. The prescription must remain in its electronic form; may not be altered in  any way; and the transfer must be communicated directly between two licensed pharmacists. It's important to note, any authorized refills transfer with the original prescription, which means the entire prescription will be filled at the same pharmacy".  Reference: [email or web address] [facility] website announcement)  [email or web address] [employer] of [employer])   [employer] / Vol. 88, No. 143 / Thursday, April 08, 2022 / Rules and Regulations DEPARTMENT OF JUSTICE  Drug Enforcement Administration  21 CFR Part 1306  [Docket No. [id number]]   RIN [id number] Transfer of Electronic Prescriptions for Schedules II-V Controlled Substances Between Pharmacies for Initial Filling  ______________________________________________________________________       ______________________________________________________________________    Transfer of Pain Medication between Pharmacies  Re: 2023 DEA Clarification on existing regulation  Published on DEA Website: May 14, 2022  Title: Revised Regulation Allows DEA-Registered Pharmacies to Electrical engineer Prescriptions at a Patient's Request DEA Headquarters Division - [employer]  "Patients now have the ability to request their electronic prescription be transferred to another pharmacy without having to go back to their practitioner to initiate the request. This revised regulation went into effect on Monday, May 10, 2022.     At a patient's request, a DEA-registered retail pharmacy can now transfer an electronic prescription for a controlled substance (schedules II-V) to another DEA-registered retail pharmacy. Prior to this change, patients would have to go through their practitioner to cancel their prescription and have it re-issued to a different pharmacy. The process was taxing and time consuming for both patients and practitioners.    The Drug Enforcement Administration [facility]) published its intent to revise the process for transferring electronic prescriptions on August 01, 2020.  The final rule was published in the federal register on April 08, 2022 and went into effect 30 days later.  Under the final rule, a prescription can only be transferred once between pharmacies, and only if allowed under existing state or other applicable law. The prescription must remain in its electronic form; may not be altered in any way; and the transfer must be communicated directly between two licensed pharmacists. It's important to note, any authorized refills transfer with the original  prescription, which means the entire prescription will be filled at the same pharmacy."    REFERENCES: 1. DEA website announcement [email or web address]  2. Department of Justice website  [email or web address]  3. DEPARTMENT OF JUSTICE Drug Enforcement Administration 21 CFR Part 1306 [Docket No. [id number]] RIN [id number] "Transfer of Electronic Prescriptions for Schedules II-V Controlled Substances Between Pharmacies for Initial Filling"  ______________________________________________________________________       ______________________________________________________________________    Medication Rules  Purpose: To inform patients, and their family members, of our medication rules and regulations.  Applies to: All patients receiving prescriptions from our practice (written or electronic).  Pharmacy of record: This is the pharmacy where your electronic prescriptions will be sent. Make sure we have the correct one.  Electronic prescriptions: In compliance with the [facility] Strengthen Opioid Misuse Prevention (STOP) Act of 2017 (Session Conni Elliot [phone]), effective September 13, 2018, all controlled substances must be electronically prescribed. Written prescriptions, faxing, or calling prescriptions to a pharmacy will no longer be done.  Prescription refills: These will be provided only during in-person appointments. No medications will be renewed without a "face-to-face" evaluation with your provider. Applies to all prescriptions.  NOTE: The following applies primarily to controlled substances (Opioid* Pain Medications).   Type of encounter (visit): For patients receiving controlled substances, face-to-face visits are required. (Not an option and not up  to the patient.)  Patient's Responsibilities: Pain Pills: Bring all pain pills to every  appointment (except for procedure appointments). Pill counts are required.  Pill Bottles: Bring pills in original pharmacy bottle. Bring bottle, even if empty. Always bring the bottle of the most recent fill.  Medication refills: You are responsible for knowing and keeping track of what medications you are taking and when is it that you will need a refill. The day before your appointment: write a list of all prescriptions that need to be refilled. The day of the appointment: give the list to the admitting nurse. Prescriptions will be written only during appointments. No prescriptions will be written on procedure days. If you forget a medication: it will not be "Called in", "Faxed", or "electronically sent". You will need to get another appointment to get these prescribed. No early refills. Do not call asking to have your prescription filled early. Partial  or short prescriptions: Occasionally your pharmacy may not have enough pills to fill your prescription.  NEVER ACCEPT a partial fill or a prescription that is short of the total amount of pills that you were prescribed.  With controlled substances the law allows 72 hours for the pharmacy to complete the prescription.  If the prescription is not completed within 72 hours, the pharmacist will require a new prescription to be written. This means that you will be short on your medicine and we WILL NOT send another prescription to complete your original prescription.  Instead, request the pharmacy to send a carrier to a nearby branch to get enough medication to provide you with your full prescription. Prescription Accuracy: You are responsible for carefully inspecting your prescriptions before leaving our office. Have the discharge nurse carefully go over each prescription with you, before taking them home. Make sure that your name is accurately spelled, that your address is correct. Check the name and dose of your medication to make sure it is accurate. Check  the number of pills, and the written instructions to make sure they are clear and accurate. Make sure that you are given enough medication to last until your next medication refill appointment. Taking Medication: Take medication as prescribed. When it comes to controlled substances, taking less pills or less frequently than prescribed is permitted and encouraged. Never take more pills than instructed. Never take the medication more frequently than prescribed.  Inform other Doctors: Always inform, all of your healthcare providers, of all the medications you take. Pain Medication from other Providers: You are not allowed to accept any additional pain medication from any other Doctor or Healthcare provider. There are two exceptions to this rule. (see below) In the event that you require additional pain medication, you are responsible for notifying us, as stated below. Cough Medicine: Often these contain an opioid, such as codeine or hydrocodone. Never accept or take cough medicine containing these opioids if you are already taking an opioid* medication. The combination may cause respiratory failure and death. Medication Agreement: You are responsible for carefully reading and following our Medication Agreement. This must be signed before receiving any prescriptions from our practice. Safely store a copy of your signed Agreement. Violations to the Agreement will result in no further prescriptions. (Additional copies of our Medication Agreement are available upon request.) Laws, Rules, & Regulations: All patients are expected to follow all [address] and [employer], Rules, [employer]. Ignorance of the Laws does not constitute a valid excuse.  Illegal drugs and Controlled Substances: The use of illegal substances (  including, but not limited to marijuana and its derivatives) and/or the illegal use of any controlled substances is strictly prohibited. Violation of this rule may result in the immediate and  permanent discontinuation of any and all prescriptions being written by our practice. The use of any illegal substances is prohibited. Adopted CDC guidelines & recommendations: Target dosing levels will be at or below 60 MME/day. Use of benzodiazepines** is not recommended. Urine Drug testing: Patients taking controlled substances will be required to provide a urine sample upon request. Do not void before coming to your medication management appointments. Hold emptying your bladder until you are admitted. The admitting nurse will inform you if a sample is required. Our practice reserves the right to call you at any time to provide a sample. Once receiving the call, you have 24 hours to comply with request. Not providing a sample upon request may result in termination of medication therapy.  Exceptions: There are only two exceptions to the rule of not receiving pain medications from other Healthcare Providers. Exception #1 (Emergencies): In the event of an emergency (i.e.: accident requiring emergency care), you are allowed to receive additional pain medication. However, you are responsible for: As soon as you are able, call our office [phone], at any time of the day or night, and leave a message stating your name, the date and nature of the emergency, and the name and dose of the medication prescribed. In the event that your call is answered by a member of our staff, make sure to document and save the date, time, and the name of the person that took your information.  Exception #2 (Planned Surgery): In the event that you are scheduled by another doctor or dentist to have any type of surgery or procedure, you are allowed (for a period no longer than 30 days), to receive additional pain medication, for the acute post-op pain. However, in this case, you are responsible for picking up a copy of our "Post-op Pain Management for Surgeons" handout, and giving it to your surgeon or dentist. This document is  available at our office, and does not require an appointment to obtain it. Simply go to our office during business hours (Monday-Thursday from 8:00 AM to 4:00 PM) (Friday 8:00 AM to 12:00 Noon) or if you have a scheduled appointment with [place], prior to your surgery, and ask for it by name. In addition, you are responsible for: calling our office [phone], at any time of the day or night, and leaving a message stating your name, name of your surgeon, type of surgery, and date of procedure or surgery. Failure to comply with your responsibilities may result in termination of therapy involving the controlled substances.  Consequences:  Non-compliance with the above rules may result in permanent discontinuation of medication prescription therapy. All patients receiving any type of controlled substance is expected to comply with the above patient responsibilities. Not doing so may result in permanent discontinuation of medication prescription therapy. Medication Agreement Violation. Following the above rules, including your responsibilities will help you in avoiding a Medication Agreement Violation ("Breaking your Pain Medication Contract").  *Opioid medications include: morphine, codeine, oxycodone, oxymorphone, hydrocodone, hydromorphone, meperidine, tramadol, tapentadol, buprenorphine, fentanyl, methadone. **Benzodiazepine medications include: diazepam (Valium), alprazolam (Xanax), clonazepam (Klonopine), lorazepam (Ativan), clorazepate (Tranxene), chlordiazepoxide (Librium), estazolam (Prosom), oxazepam (Serax), temazepam (Restoril), triazolam (Halcion) (Last updated: 07/06/2023) ______________________________________________________________________      ______________________________________________________________________    Medication Recommendations and Reminders  Applies to: All patients receiving prescriptions (written and/or

## 2023-12-27 DIAGNOSIS — F331 Major depressive disorder, recurrent, moderate: Secondary | ICD-10-CM | POA: Diagnosis not present

## 2023-12-28 ENCOUNTER — Encounter: Payer: Self-pay | Admitting: Occupational Therapy

## 2023-12-28 ENCOUNTER — Ambulatory Visit: Admitting: Occupational Therapy

## 2023-12-28 NOTE — Therapy (Addendum)
 OUTPATIENT OCCUPATIONAL THERAPY  EVALUATION   BILATERAL LOWER EXTREMITY LYMPHEDEMA   Patient Name: Karen Lewis MRN: [id number] DOB:07/13/1971, 53 y.o., female Today's Date: 12/28/2023  END OF SESSION:   OT End of Session - 12/28/23 0812     Visit Number 1    Number of Visits 12   Date for OT Re-Evaluation 12/22/23    Authorization Type OT evaluation and Discharge    Activity Tolerance Patient tolerated treatment well;No increased pain    Behavior During Therapy WFL for tasks assessed/performed                Past Medical History:  Diagnosis Date   Anemia    Anginal pain (HCC)    Anxiety    Arthralgia of hip 07/29/2015   Arthritis    Arthritis, degenerative 07/29/2015   Asthma    Cephalalgia 07/25/2014   Dependence on unknown drug (HCC)    multiplt controlled drug dependence   Depression    Diabetes mellitus without complication (HCC)    Difficult intubation    per pt needs small tube (has had abrasions from tube in past)   Dysrhythmia    PVC's   Eczema    Fibromyalgia    Gastritis    GERD (gastroesophageal reflux disease)    Gonalgia 07/29/2015   Overview:  Overview:  The patient has had bilateral intra-articular Hyalgan injections done on 07/16/2014 and although she seems to do well with this type of therapy, apparently her insurance company does not want to pay for they Hyalgan. On 11/27/2014 the patient underwent a bilateral genicular nerve block with excellent results. On 01/28/2015 she had a right knee genicular radiofrequency ablatio   Gout    H/O cardiovascular disorder 03/10/2015   H/O surgical procedure 12/05/2012   Overview:  LSG ([facility] - April 2013)     H/O thyroid  disease 03/10/2015   Headache    Herpes    History of artificial joint 07/29/2015   History of hiatal hernia    Hypertension    Hypomagnesemia    Hypothyroidism    IDA (iron  deficiency anemia) 05/28/2019   LBP (low back pain) 07/29/2015   Neuromuscular disorder (HCC)    Obesity     PCOS (polycystic ovarian syndrome)    Primary osteoarthritis of both knees 07/29/2015   Sleep apnea    not using a Cpap machine at this time - most recent test mild apnea does not qualify for test (not OSA)   Thyroid  nodule bilateral   Umbilical hernia    Past Surgical History:  Procedure Laterality Date   APPENDECTOMY     CARPAL TUNNEL RELEASE Bilateral    CHOLECYSTECTOMY     COLONOSCOPY WITH PROPOFOL  N/A 02/11/2021   Procedure: COLONOSCOPY WITH PROPOFOL ;  Surgeon: Toledo, Alphonsus Jeans, MD;  Location: [facility];  Service: Gastroenterology;  Laterality: N/A;   DIAGNOSTIC LAPAROSCOPY     ESOPHAGOGASTRODUODENOSCOPY (EGD) WITH PROPOFOL  N/A 02/11/2021   Procedure: ESOPHAGOGASTRODUODENOSCOPY (EGD) WITH PROPOFOL ;  Surgeon: Toledo, Alphonsus Jeans, MD;  Location: [facility];  Service: Gastroenterology;  Laterality: N/A;   HIATAL HERNIA REPAIR     JOINT REPLACEMENT Bilateral hip   LAPAROSCOPIC PARTIAL GASTRECTOMY     left trigger finger     peniculectomy N/A 07/05/2018   ROUX-EN-Y GASTRIC BYPASS  06/03/2017   SHOULDER ARTHROSCOPY Right    THYROIDECTOMY N/A 11/12/2015   Procedure: THYROIDECTOMY;  Surgeon: Von Grumbling, MD;  Location: [facility];  Service: ENT;  Laterality: N/A;   TOTAL HIP ARTHROPLASTY Right  11/27/2019   Procedure: TOTAL HIP ARTHROPLASTY ANTERIOR APPROACH;  Surgeon: Molli Angelucci, MD;  Location: [facility];  Service: Orthopedics;  Laterality: Right;   TRIGGER FINGER RELEASE Right    TRIGGER FINGER RELEASE Right 04/24/2020   Procedure: Right ring and middle trigger release;  Surgeon: Molli Angelucci, MD;  Location: [facility];  Service: Orthopedics;  Laterality: Right;   Patient Active Problem List   Diagnosis Date Noted   Cervical radiculopathy at C6 12/26/2023   Cervicalgia 12/26/2023   DDD (degenerative disc disease), cervical 12/26/2023   Skin laxity 11/30/2023   DDD (degenerative disc disease), lumbosacral 09/11/2023   Lumbar facet joint pain 07/07/2023   AKI (acute kidney  injury) (HCC) 08/08/2022   Pre-syncope 08/07/2022   Trigger point of right shoulder region 05/24/2022   Lumbar interspinous bursitis (L2-L5) 04/29/2022   Spinal enthesopathy of lumbar region (HCC) 04/29/2022   Lumbar facet arthropathy (Multilevel) (Bilateral) 04/29/2022   Lumbar central spinal stenosis, w/o claudication (L2-3, L4-5, L5-S1) 04/29/2022   Lumbar foraminal stenosis (Bilateral: L2-3, L4-5) (L>R) 04/29/2022   Lumbosacral lateral recess stenosis (Bilateral: L4-5, L5-S1) 04/29/2022   Lumbosacral radiculopathy at L2 (Right) 04/13/2022   Abnormal MRI, lumbar spine (07/01/2022) 04/13/2022   Chronic thigh pain (Right) 04/13/2022   Weakness of leg (Right) 04/13/2022   Tricompartment osteoarthritis of knees (Bilateral) 03/25/2022   Difficulty sleeping 12/07/2021   Lumbosacral radiculopathy at S1 (Left) 10/26/2021   Osteoarthritis of lumbar spine without myelopathy or radiculopathy 06/03/2021   Chronic use of opiate for therapeutic purpose 01/21/2021   Colon cancer screening 12/04/2020   Barrett's esophagus without dysplasia 12/04/2020   Uncomplicated opioid dependence (HCC) 09/25/2020   Pain and numbness of left upper extremity 03/13/2020   Cervical radiculitis (Left) 03/13/2020   Pharmacologic therapy 02/19/2020   S/P THR (total hip replacement) (Right) (11/27/2019) 02/19/2020   Aftercare following joint replacement surgery 12/23/2019   History of total hip replacement (Bilateral) 11/27/2019   Bilateral primary osteoarthritis of knee 09/14/2019   Presence of artificial hip joint, bilateral 09/14/2019   Body mass index (BMI) 70 or greater, adult (HCC) 09/14/2019   Morbid (severe) obesity due to excess calories (HCC) 09/14/2019   Essential (primary) hypertension 09/14/2019   Lymphedema 08/20/2019   Type II diabetes mellitus with complication (HCC) 08/16/2019   Atypical chest pain 08/13/2019   Heart palpitations 08/13/2019   Leg swelling 07/15/2019   Elevated LFTs 06/07/2019    Edema of both legs 06/04/2019   Hypocalcemia 06/04/2019   Adult failure to thrive syndrome 06/03/2019   Iron  deficiency anemia, unspecified 05/28/2019   Mycobacterium abscessus infection 02/22/2019   Surgical wound, non healing 09/27/2018   Wound infection after surgery 08/11/2018   Panniculitis 07/05/2018   Allergy to iodine 04/26/2018   H/O allergy to shellfish 04/26/2018   Encounter for dental examination 02/21/2018   Spondylosis without myelopathy or radiculopathy, lumbosacral region 02/21/2018   History of gastric bypass 02/15/2018   Depression 01/19/2018   Secondary Osteoarthritis of knee (Bilateral) (R>L) 01/02/2018   Encounter for dental exam and cleaning w/o abnormal findings 12/19/2017   Post op infection 12/05/2017   Osteoarthritis of hip (Right) 07/07/2017   Osteoarthritis of shoulder (Left) 05/31/2017   Upper extremity pain 04/21/2017   Chronic shoulder pain (Left) 04/21/2017   Chronic pain syndrome 12/02/2016   Neurogenic pain 12/02/2016   Chest pain with low risk of acute coronary syndrome 08/04/2016   SOB (shortness of breath) on exertion 08/04/2016   Dysphagia, unspecified 05/04/2016   Gastro-esophageal reflux disease without  esophagitis 05/04/2016   GERD (gastroesophageal reflux disease) 05/04/2016   Vitamin D  insufficiency 11/27/2015   Nontoxic goiter 11/12/2015   Other psychoactive substance dependence, uncomplicated (HCC) 10/13/2015   Controlled drug dependence (HCC) 10/13/2015   Lumbar spondylosis 09/18/2015   Hypomagnesemia 08/27/2015   Class 3 severe obesity due to excess calories with serious comorbidity and body mass index (BMI) of 60.0 to 69.9 in adult [facility]) 08/27/2015   Long term current use of opiate analgesic 07/29/2015   Long term prescription opiate use 07/29/2015   Opiate use 07/29/2015   Encounter for therapeutic drug level monitoring 07/29/2015   Opiate dependence (HCC) 07/29/2015   Chronic knee pain (1ry area of Pain) (Bilateral) (R>L)  07/29/2015   Chronic low back pain (3ry area of Pain) (Bilateral) (R>L) w/o sciatica 07/29/2015   Lumbar facet syndrome (Bilateral) (R>L) 07/29/2015   Secondary osteoarthritis of multiple sites 07/29/2015   Grade 1  Anterolisthesis of L3/L4 & L4/L5 (1.4 cm) 07/29/2015   Chronic hip pain s/p THR (2ry area of pain) (Bilateral) 07/29/2015   S/P THR (total hip replacement) (Left) 07/29/2015   History of methicillin resistant staphylococcus aureus (MRSA) 07/29/2015   History of bariatric surgery 07/29/2015   History of methicillin resistant Staphylococcus aureus infection 07/29/2015   Eczema 05/21/2015   Headache, migraine 05/21/2015   Bilateral polycystic ovarian syndrome 05/21/2015   Disease of thyroid  gland 05/21/2015   Major depressive disorder, single episode, unspecified 05/21/2015   Dermatitis 05/21/2015   Obstructive apnea 04/30/2015   History of asthma 03/10/2015   Binge eating disorder 03/10/2015   Fibromyalgia 03/10/2015   Anxiety, generalized 03/10/2015   Insomnia, persistent 03/10/2015   Depression, major, recurrent, moderate (HCC) 03/10/2015   Avitaminosis D 04/30/2014   Hyperlipidemia, unspecified 04/25/2014   Hyperlipidemia due to type 2 diabetes mellitus (HCC) 04/25/2014   Uncomplicated asthma 03/19/2014   Asthma 03/19/2014   Goiter, nontoxic, multinodular 10/09/2013   Apnea, sleep 12/05/2012   History of surgical procedure 12/05/2012   Type 2 diabetes mellitus without complication, without long-term current use of insulin  (HCC) 12/05/2012   Hypertension associated with diabetes (HCC) 12/05/2012   Other specified postprocedural states 12/05/2012    PCP: Yehuda Helms, MD  REFERRING PROVIDER: Sharla Davis, NP  REFERRING DIAG: I89.0  THERAPY DIAG:  Lymphedema, not elsewhere classified  Rationale for Evaluation and Treatment: Rehabilitation  ONSET DATE: "years ago"  SUBJECTIVE:                                                                                                                                                                                            SUBJECTIVE STATEMENT: Karen Clement  Lewis is referred to Occupational Therapy by Sharla Davis, NP for evaluation and treatment of BLE lymphedema. This Pt is known to this OT as I evaluated her on 09/27/19 for the same referring provider. Pt states she wants to know if there's anything new that could assist w her leg swelling. Pt reports chronic, progressive leg swelling for years.Pt endorses positive leg swelling in her father. Pt has not undergone lymphedema treatment in the past and she does not wear compression stockings. She underwent Roux-en-y gastric bypass in 2018 and paniculectomy in 2019.  Pt is awaiting delivery of a pneumatic compression device for her legs - brand and type unknown. Pt reports she is taking Ozempic and Topamax  and trying to exercise more to lose more weight. She states she is fairly satisfied with her current weight. Pt's goals for OT are to learn about any new treatment options for lymphedema and to control and limit progression of LE swelling.  PERTINENT HISTORY: Relevant to Lymphedema Fibromyalgia Long term current use of opiate analgesic Long term prescription opiate use Opiate use Opiate dependence (HCC) Chronic knee pain (1ry area of Pain) (Bilateral) (R>L) Chronic low back pain (3ry area of Pain) (Bilateral) (R>L) w/o sciatica Lumbar facet syndrome (Bilateral) (R>L) Secondary osteoarthritis of multiple sites Grade 1  Anterolisthesis of L3/L4 & L4/L5 (1.4 cm) Chronic hip pain s/p THR (2ry area of pain) (Bilateral) Class 3 severe obesity due to excess calories with serious comorbidity and body mass index (BMI) of 60.0 to 69.9 in adult [facility]) Lumbar spondylosis Chronic pain syndrome Neurogenic pain Chronic shoulder pain (Left) Osteoarthritis of shoulder (Left) Osteoarthritis of hip (Right) Secondary Osteoarthritis of knee (Bilateral) (R>L) Spondylosis without  myelopathy or radiculopathy, lumbosacral region History of gastric bypass Pharmacologic therapy Bilateral primary osteoarthritis of knee Osteoarthritis of lumbar spine without myelopathy or radiculopathy Lumbosacral radiculopathy at S1 (Left) Tricompartment osteoarthritis of knees (Bilateral) Lumbosacral radiculopathy at L2 (Right) Chronic thigh pain (Right) Weakness of leg (Right) Lumbar interspinous bursitis (L2-L5) Spinal enthesopathy of lumbar region [facility]) Lumbar facet arthropathy (Multilevel) (Bilateral) Lumbar central spinal stenosis, w/o claudication (L2-3, L4-5, L5-S1) Lumbar foraminal stenosis (Bilateral: L2-3, L4-5) (L>R) Lumbosacral lateral recess stenosis (Bilateral: L4-5, L5-S1) Lumbar facet joint pain DDD (degenerative disc disease), lumbosacral Cervical radiculopathy at C6 Cervicalgia DDD (degenerative disc disease), cervical History of asthma Binge eating disorder Anxiety, generalized Insomnia, persistent Depression, major, recurrent, moderate (HCC) Uncomplicated asthma Eczema Obstructive apnea Bilateral polycystic ovarian syndrome Apnea, sleep Disease of thyroid  gland Type 2 diabetes mellitus without complication, without long-term current use of insulin  (HCC) Hypertension associated with diabetes (HCC) S/P THR (total hip replacement) (Left) History of methicillin resistant staphylococcus aureus (MRSA) History of bariatric surgery History of methicillin resistant Staphylococcus aureus infection Chest pain with low risk of acute coronary syndrome Other psychoactive substance dependence, uncomplicated (HCC) SOB (shortness of breath) on exertion Upper extremity pain Controlled drug dependence (HCC) Hyperlipidemia due to type 2 diabetes mellitus (HCC) Post op infection Major depressive disorder, single episode, unspecified Depression Dermatitis Asthma Panniculitis Surgical wound, non healing Mycobacterium abscessus infection Wound infection after  surgery Iron  deficiency anemia, unspecified Leg swelling Adult failure to thrive syndrome Edema of both legs Elevated LFTs Hypocalcemia Atypical chest pain Heart palpitations Type II diabetes mellitus with complication (HCC) Lymphedema History of total hip replacement (Bilateral) S/P THR (total hip replacement) (Right) (11/27/2019) Pain and numbness of left upper extremity Cervical radiculitis (Left) Uncomplicated opioid dependence (HCC) Aftercare following joint replacement surgery Barrett's esophagus without dysplasia GERD (gastroesophageal reflux disease) Chronic use of opiate  for therapeutic purpose Difficulty sleeping Abnormal MRI, lumbar spine (07/01/2022) Pre-syncope AKI (acute kidney injury) (HCC) Presence of artificial hip joint, bilateral Body mass index (BMI) 70 or greater, adult (HCC) Morbid (severe) obesity due to excess calories (HCC) Essential (primary) hypertension    PAIN:  Are you having pain? Yes: NPRS scale: not rated numerically Pain location: B LE Pain description: heavy, sore Aggravating factors: standing, walking Relieving factors: moving, leg elevation  PRECAUTIONS: Other: LYMPHEDEMA PRECAUTIONS: cardiac, pulmonary, thyroid   RED FLAGS: Hx cellulitis, non-healing wounds, BMI 59.70 k/m2    WEIGHT BEARING RESTRICTIONS: No  FALLS:  Has patient fallen in last 6 months? No  LIVING ENVIRONMENT: Lives with: lives with their partner Lives in: House/apartment   OCCUPATION: disability, unemployed  LEISURE: TBA  HAND DOMINANCE: right   PRIOR LEVEL OF FUNCTION: Independent with household mobility with device, Independent with community mobility with device, Requires assistive device for independence, Needs assistance with ADLs, Needs assistance with homemaking, and Needs assistance with gait  PATIENT GOALS: to learn about any new treatment options for lymphedema and to control and limit progression of LE swelling.  OBJECTIVE: Note: Objective  measures were completed at Evaluation unless otherwise noted.  COGNITION:  Overall cognitive status: Within functional limits for tasks assessed   OBSERVATIONS / OTHER ASSESSMENTS: Severe BLE lipo-lymphedema 2/2 Lipedema and severe stage II, obesity-induced lymphedema  LE ROM: limited at hips, knees and ankles 2/2 body habitus and skin approximation  LE MMT: WFL  LYMPHEDEMA ASSESSMENTS:  SURGERY TYPE/DATE: Non-cancer related, N/A  INFECTIONS: + hx of cellulitis  GAIT: Distance walked: riding scooter Assistive device utilized: None Level of assistance: Modified independence and    LYMPHEDEMA LIFE IMPACT SCALE (LLIS): NT  PSYCHOSOCIAL CONCERNS: Lymphedema affects my body image (i.e.. "How I think I look") Lymphedema "gets me down" (i.e.. I have feelings of depression, frustration, or anger due to the lymphedema)  TREATMENT THIS DATE:  OT Evaluation Pt edu   PATIENT EDUCATION:  Discussed differential diagnoses for various swelling disorders. Provided basic level education regarding lymphatic structure and function, etiology, onset patterns, stages of progression, and prevention to limit infection risk, worsening condition and further functional decline. Pt edu for aught interaction between blood circulatory system and lymphatic circulation.Discussed  impact of gravity and co-morbidities on lymphatic function. Outlined Complete Decongestive Therapy (CDT)  as standard of care and provided in depth information regarding 4 primary components of Intensive and Self Management Phases, including Manual Lymph Drainage (MLD), compression wrapping and garments, skin care, and therapeutic exercise. Samuel Crock discussion with re need for frequent attendance and high burden of care when caregiver is needed, impact of co morbidities. We discussed  the chronic, progressive nature of lymphedema and Importance of daily, ongoing LE self-care essential for limiting progression and infection risk.  Person  educated: Patient  Education method: Explanation, Demonstration, and Handouts Education comprehension: verbalized understanding, returned demonstration, verbal cues required, and needs further education  LYMPHEDEMA SELF_CARE HOME PROGRAM: Lymphedema Self-Care Home Program: Simple self-MLD1/daily to affected quadrant and body part At least 2 x daily BLE Lymphatic Pumping There ex 1 set of 10 reps each, in order, bilaterally Daily skin care to affected body part to limit infection risk and increase skin excursion Compression Intensive stage compression: multilayer short stretch wraps with gradient techniques. One limb at a time. Length patient dependent. Self-management Phase: Appropriate daytime compression garment and hours-of-sleep device  Custom-made gradient compression garments and hours-of-sleep devices are medically necessary because they are uniquely sized and shaped to fit the  exact dimensions of the affected extremities and to provide accurate and consistent gradient compression and containment, essential  for optimally managing chronic, progressive lymphedema. The convoluted HOS devices are medically necessary to facilitate increased lymphatic circulation and limit fibrosis formation when sleeping. Multiple custom compression garments are needed for optimal hygiene to limit infection risk. Custom compression garments should be replaced q 3-6 months When worn consistently for optimal lymphedema self-management over time.  ASSESSMENT:  CLINICAL IMPRESSION: Karen Lewis is a 53 yo female presenting with severe BLE lipo-lymphedema 2/2 Lipedema, and severe stage II, obesity-induced lymphedema (OIL). Of bilateral lower extremities and lower quadrants. These combined and complex diagnoses are  difficult to treat since the lipedema aspect does not typically respond to gold standard lymphedema treatment, Complete Decongestive Therapy (CDT), and the lymphedema aspect may respond to CDT, but only  marginally. Complete Decongestive Therapy (CDT) consists of 2 phases. CDT includes daily manual lymphatic drainage (MLD), skin care and therapeutic  lymphatic pumping exercises. During the Intensive Phase of CDT, multilayer, gradient compression bandages are applied daily to one leg at a time to reduce limb volume. Once limb volume reduction reaches a clinical plateau Pt is fitted with appropriate compression garments and Self-Management Phase of CDT begins.   LIPO-LYMPHEDEMA: First described in the 1940's, lipedema is a chronic condition that presents as symmetrical accumulation of fat in the subcutaneous tissue with almost exclusive occurrence in women. Fat tissue expands primarily in the lower limbs, from buttocks to ankles, as well as in the arms, with weight loss strategies exhibiting limited effects on limb fat. Lipedema is further characterized by pain, tenderness upon contact, easy bruising, swelling (edema), and psychosocial issues that all impact a patient's overall quality of life. Onset of lipedema is reported to occur during puberty. However, it can occur or be exacerbated by periods of hormonal shifts, such as pregnancy or menopause, although data as to why are limited.  Weight gain and obesity are also considered to play a role in lipedema onset and exacerbation of symptoms.  The fundamental treatment for OIL is weight loss. Resection of areas of massive weight gain and localized lymphatic lobules may be considered, and lower extremity volume reductions are performed when the BMI is lowered to <40 to reduce complications and recurrence. In this case, history of DM 2 and non-healing wounds may preclude this action. To be successful with CDT and lymphedema self-management to limit progression over time, this Pt will benefit from massive weight loss. Once weight loss is achieved Pt may not still need daily caregiver assistance to apply multilayer, gradient compression wraps, and later, custom  compression garments, or alternative devices. The burden of care is high with CDT in both phases when Pt is unable to reach feet and distal legs to wrap and / or don /doff compression garments.  At present [employer] prognosis is not a good candidate or CDT due to her BMI of nearly 60 (of 59.7 kg/m2) . Pt will benefit from ongoing weight loss and daily use of an advanced sequential compression device to facilitate improved lymphatic decongestion and reduced pain/ discomfort in her legs and lower body. Pt tells me that this is in the works  with Sharla Davis and [employer]. OT emailed the manufacturer's rep to follow up. Pt will also benefit from compression garments to assist with lymphedema management and to reduce pain.Pt should be fitted with:  BLE, custom , Jobst , flat knit , ELVAREX CLASSIC, ccl 2 ( 23-32 mmHg) knee  high compression stockings to be worn under Sigvaris 10-46mmHg  capri length, compression shorts for full time daily use.  Custom-made gradient compression garments are medically necessary because they are uniquely sized and shaped to fit the exact dimensions of the affected extremities, and to provide appropriate medical grade, graduated compression essential for optimally management of chronic, progressive lymphedema. Multiple custom compression garments are needed to ensure proper hygiene to limit infection risk. Custom compression garments should be replaced q 3-6 months When worn consistently for optimal lipo-lymphedema self-management over time. HOS devices, medically necessary to limit fibrosis buildup in tisse, should be replaced q 2 years and PRN when worn out.       OBJECTIVE IMPAIRMENTS: Abnormal gait, decreased activity tolerance, decreased balance, decreased endurance, decreased knowledge of condition, decreased knowledge of use of DME, decreased mobility, difficulty walking, decreased ROM, decreased strength, increased edema, impaired flexibility, impaired sensation,  impaired UE functional use, postural dysfunction, obesity, and pain.   ACTIVITY LIMITATIONS: Basic and Instrumental ADLs (meal prep, cleaning, laundry, driving, shopping, community activity, occupation, yard work, school, church ), Functional ambulation and mobility (carrying, lifting, bending, sitting, standing, squatting, stairs, transfers, and bed mobility),   PARTICIPATION LIMITATIONS: Participation in community activities, decreased social participation  PERSONAL FACTORS: Age, Behavior pattern, Fitness, Past/current experiences, Time since onset of injury/illness/exacerbation, and 3+ comorbidities: complex constellation of chronic   are also affecting patient's functional outcome.   REHAB POTENTIAL: Poor    EVALUATION COMPLEXITY: High   GOALS: Goals reviewed with patient? Yes  SHORT TERM GOALS: Target date: 12/22/23  Pt presents with limited knowledge of impact of body weight and BMI on lymphedema exacerbation, progression, and functional performance. Pt will understand impact of body fat accumulation and distribution 2/2 Lipedema and Lipo-Lymphedema on lymphatic function to improve compliance with lymphedema self management over time. Baseline: Dependent Goal status: INITIAL; ONGOING  PLAN:  OT FREQUENCY:  N/Apt discharged this date  OT DURATION: other: N/A Pt discharged from OT for CDT this date  PLANNED INTERVENTIONS: 97535- Self Care and Patient/Family education  PLAN FOR NEXT SESSION: N/A Pt discharged from OT for CDT this date   Arnold Bicker, MS, OTR/L, CLT-[facility] 12/28/23 1:22 PM

## 2023-12-31 ENCOUNTER — Ambulatory Visit
Admission: RE | Admit: 2023-12-31 | Discharge: 2023-12-31 | Disposition: A | Discharge status: Home / Self Care | Source: Ambulatory Visit | Attending: Pain Medicine | Admitting: Pain Medicine

## 2023-12-31 DIAGNOSIS — M5030 Other cervical disc degeneration, unspecified cervical region: Secondary | ICD-10-CM | POA: Diagnosis not present

## 2023-12-31 DIAGNOSIS — G8929 Other chronic pain: Secondary | ICD-10-CM | POA: Insufficient documentation

## 2023-12-31 DIAGNOSIS — M542 Cervicalgia: Secondary | ICD-10-CM | POA: Diagnosis not present

## 2023-12-31 DIAGNOSIS — M25512 Pain in left shoulder: Secondary | ICD-10-CM | POA: Insufficient documentation

## 2023-12-31 DIAGNOSIS — R200 Anesthesia of skin: Secondary | ICD-10-CM | POA: Diagnosis not present

## 2023-12-31 DIAGNOSIS — M4802 Spinal stenosis, cervical region: Secondary | ICD-10-CM | POA: Diagnosis not present

## 2023-12-31 DIAGNOSIS — M47812 Spondylosis without myelopathy or radiculopathy, cervical region: Secondary | ICD-10-CM | POA: Diagnosis not present

## 2023-12-31 DIAGNOSIS — M5412 Radiculopathy, cervical region: Secondary | ICD-10-CM | POA: Insufficient documentation

## 2024-01-04 ENCOUNTER — Ambulatory Visit: Admitting: Occupational Therapy

## 2024-01-04 DIAGNOSIS — I890 Lymphedema, not elsewhere classified: Secondary | ICD-10-CM

## 2024-01-04 DIAGNOSIS — M19012 Primary osteoarthritis, left shoulder: Secondary | ICD-10-CM | POA: Diagnosis not present

## 2024-01-04 DIAGNOSIS — H532 Diplopia: Secondary | ICD-10-CM | POA: Diagnosis not present

## 2024-01-04 DIAGNOSIS — G8929 Other chronic pain: Secondary | ICD-10-CM | POA: Diagnosis not present

## 2024-01-04 DIAGNOSIS — M797 Fibromyalgia: Secondary | ICD-10-CM | POA: Diagnosis not present

## 2024-01-04 DIAGNOSIS — M25561 Pain in right knee: Secondary | ICD-10-CM | POA: Diagnosis not present

## 2024-01-04 DIAGNOSIS — M170 Bilateral primary osteoarthritis of knee: Secondary | ICD-10-CM | POA: Diagnosis not present

## 2024-01-04 DIAGNOSIS — M545 Low back pain, unspecified: Secondary | ICD-10-CM | POA: Diagnosis not present

## 2024-01-04 DIAGNOSIS — G479 Sleep disorder, unspecified: Secondary | ICD-10-CM | POA: Diagnosis not present

## 2024-01-04 DIAGNOSIS — G43719 Chronic migraine without aura, intractable, without status migrainosus: Secondary | ICD-10-CM | POA: Diagnosis not present

## 2024-01-06 ENCOUNTER — Encounter: Payer: Self-pay | Admitting: Occupational Therapy

## 2024-01-06 ENCOUNTER — Other Ambulatory Visit: Payer: Self-pay | Admitting: Orthopedic Surgery

## 2024-01-06 DIAGNOSIS — M19012 Primary osteoarthritis, left shoulder: Secondary | ICD-10-CM

## 2024-01-06 NOTE — Therapy (Signed)
 OUTPATIENT OCCUPATIONAL THERAPY  TREATMENT  BILATERAL LOWER EXTREMITY LYMPHEDEMA   Patient Name: Karen Lewis MRN: [id number] DOB:08/03/1971, 53 y.o., female Today's Date: 01/06/2024  END OF SESSION:   OT End of Session - 01/06/24 1115     Visit Number 2    Number of Visits 12    Date for OT Re-Evaluation 03/22/24    OT Start Time 0100    OT Stop Time 0210    OT Time Calculation (min) 70 min    Activity Tolerance Patient tolerated treatment well;No increased pain    Behavior During Therapy WFL for tasks assessed/performed                     Past Medical History:  Diagnosis Date   Anemia    Anginal pain (HCC)    Anxiety    Arthralgia of hip 07/29/2015   Arthritis    Arthritis, degenerative 07/29/2015   Asthma    Cephalalgia 07/25/2014   Dependence on unknown drug (HCC)    multiplt controlled drug dependence   Depression    Diabetes mellitus without complication (HCC)    Difficult intubation    per pt needs small tube (has had abrasions from tube in past)   Dysrhythmia    PVC's   Eczema    Fibromyalgia    Gastritis    GERD (gastroesophageal reflux disease)    Gonalgia 07/29/2015   Overview:  Overview:  The patient has had bilateral intra-articular Hyalgan injections done on 07/16/2014 and although she seems to do well with this type of therapy, apparently her insurance company does not want to pay for they Hyalgan. On 11/27/2014 the patient underwent a bilateral genicular nerve block with excellent results. On 01/28/2015 she had a right knee genicular radiofrequency ablatio   Gout    H/O cardiovascular disorder 03/10/2015   H/O surgical procedure 12/05/2012   Overview:  LSG ([facility] - April 2013)     H/O thyroid  disease 03/10/2015   Headache    Herpes    History of artificial joint 07/29/2015   History of hiatal hernia    Hypertension    Hypomagnesemia    Hypothyroidism    IDA (iron  deficiency anemia) 05/28/2019   LBP (low back pain) 07/29/2015    Neuromuscular disorder (HCC)    Obesity    PCOS (polycystic ovarian syndrome)    Primary osteoarthritis of both knees 07/29/2015   Sleep apnea    not using a Cpap machine at this time - most recent test mild apnea does not qualify for test (not OSA)   Thyroid  nodule bilateral   Umbilical hernia    Past Surgical History:  Procedure Laterality Date   APPENDECTOMY     CARPAL TUNNEL RELEASE Bilateral    CHOLECYSTECTOMY     COLONOSCOPY WITH PROPOFOL  N/A 02/11/2021   Procedure: COLONOSCOPY WITH PROPOFOL ;  Surgeon: Toledo, Alphonsus Jeans, MD;  Location: [facility];  Service: Gastroenterology;  Laterality: N/A;   DIAGNOSTIC LAPAROSCOPY     ESOPHAGOGASTRODUODENOSCOPY (EGD) WITH PROPOFOL  N/A 02/11/2021   Procedure: ESOPHAGOGASTRODUODENOSCOPY (EGD) WITH PROPOFOL ;  Surgeon: Toledo, Alphonsus Jeans, MD;  Location: [facility];  Service: Gastroenterology;  Laterality: N/A;   HIATAL HERNIA REPAIR     JOINT REPLACEMENT Bilateral hip   LAPAROSCOPIC PARTIAL GASTRECTOMY     left trigger finger     peniculectomy N/A 07/05/2018   ROUX-EN-Y GASTRIC BYPASS  06/03/2017   SHOULDER ARTHROSCOPY Right    THYROIDECTOMY N/A 11/12/2015   Procedure: THYROIDECTOMY;  Surgeon:  Von Grumbling, MD;  Location: [facility];  Service: ENT;  Laterality: N/A;   TOTAL HIP ARTHROPLASTY Right 11/27/2019   Procedure: TOTAL HIP ARTHROPLASTY ANTERIOR APPROACH;  Surgeon: Molli Angelucci, MD;  Location: [facility];  Service: Orthopedics;  Laterality: Right;   TRIGGER FINGER RELEASE Right    TRIGGER FINGER RELEASE Right 04/24/2020   Procedure: Right ring and middle trigger release;  Surgeon: Molli Angelucci, MD;  Location: [facility];  Service: Orthopedics;  Laterality: Right;   Patient Active Problem List   Diagnosis Date Noted   Cervical radiculopathy at C6 12/26/2023   Cervicalgia 12/26/2023   DDD (degenerative disc disease), cervical 12/26/2023   Skin laxity 11/30/2023   DDD (degenerative disc disease), lumbosacral 09/11/2023   Lumbar facet joint  pain 07/07/2023   AKI (acute kidney injury) (HCC) 08/08/2022   Pre-syncope 08/07/2022   Trigger point of right shoulder region 05/24/2022   Lumbar interspinous bursitis (L2-L5) 04/29/2022   Spinal enthesopathy of lumbar region (HCC) 04/29/2022   Lumbar facet arthropathy (Multilevel) (Bilateral) 04/29/2022   Lumbar central spinal stenosis, w/o claudication (L2-3, L4-5, L5-S1) 04/29/2022   Lumbar foraminal stenosis (Bilateral: L2-3, L4-5) (L>R) 04/29/2022   Lumbosacral lateral recess stenosis (Bilateral: L4-5, L5-S1) 04/29/2022   Lumbosacral radiculopathy at L2 (Right) 04/13/2022   Abnormal MRI, lumbar spine (07/01/2022) 04/13/2022   Chronic thigh pain (Right) 04/13/2022   Weakness of leg (Right) 04/13/2022   Tricompartment osteoarthritis of knees (Bilateral) 03/25/2022   Difficulty sleeping 12/07/2021   Lumbosacral radiculopathy at S1 (Left) 10/26/2021   Osteoarthritis of lumbar spine without myelopathy or radiculopathy 06/03/2021   Chronic use of opiate for therapeutic purpose 01/21/2021   Colon cancer screening 12/04/2020   Barrett's esophagus without dysplasia 12/04/2020   Uncomplicated opioid dependence (HCC) 09/25/2020   Pain and numbness of left upper extremity 03/13/2020   Cervical radiculitis (Left) 03/13/2020   Pharmacologic therapy 02/19/2020   S/P THR (total hip replacement) (Right) (11/27/2019) 02/19/2020   Aftercare following joint replacement surgery 12/23/2019   History of total hip replacement (Bilateral) 11/27/2019   Bilateral primary osteoarthritis of knee 09/14/2019   Presence of artificial hip joint, bilateral 09/14/2019   Body mass index (BMI) 70 or greater, adult (HCC) 09/14/2019   Morbid (severe) obesity due to excess calories (HCC) 09/14/2019   Essential (primary) hypertension 09/14/2019   Lymphedema 08/20/2019   Type II diabetes mellitus with complication (HCC) 08/16/2019   Atypical chest pain 08/13/2019   Heart palpitations 08/13/2019   Leg swelling  07/15/2019   Elevated LFTs 06/07/2019   Edema of both legs 06/04/2019   Hypocalcemia 06/04/2019   Adult failure to thrive syndrome 06/03/2019   Iron  deficiency anemia, unspecified 05/28/2019   Mycobacterium abscessus infection 02/22/2019   Surgical wound, non healing 09/27/2018   Wound infection after surgery 08/11/2018   Panniculitis 07/05/2018   Allergy to iodine 04/26/2018   H/O allergy to shellfish 04/26/2018   Encounter for dental examination 02/21/2018   Spondylosis without myelopathy or radiculopathy, lumbosacral region 02/21/2018   History of gastric bypass 02/15/2018   Depression 01/19/2018   Secondary Osteoarthritis of knee (Bilateral) (R>L) 01/02/2018   Encounter for dental exam and cleaning w/o abnormal findings 12/19/2017   Post op infection 12/05/2017   Osteoarthritis of hip (Right) 07/07/2017   Osteoarthritis of shoulder (Left) 05/31/2017   Upper extremity pain 04/21/2017   Chronic shoulder pain (Left) 04/21/2017   Chronic pain syndrome 12/02/2016   Neurogenic pain 12/02/2016   Chest pain with low risk of acute coronary syndrome 08/04/2016  SOB (shortness of breath) on exertion 08/04/2016   Dysphagia, unspecified 05/04/2016   Gastro-esophageal reflux disease without esophagitis 05/04/2016   GERD (gastroesophageal reflux disease) 05/04/2016   Vitamin D  insufficiency 11/27/2015   Nontoxic goiter 11/12/2015   Other psychoactive substance dependence, uncomplicated (HCC) 10/13/2015   Controlled drug dependence (HCC) 10/13/2015   Lumbar spondylosis 09/18/2015   Hypomagnesemia 08/27/2015   Class 3 severe obesity due to excess calories with serious comorbidity and body mass index (BMI) of 60.0 to 69.9 in adult [facility]) 08/27/2015   Long term current use of opiate analgesic 07/29/2015   Long term prescription opiate use 07/29/2015   Opiate use 07/29/2015   Encounter for therapeutic drug level monitoring 07/29/2015   Opiate dependence (HCC) 07/29/2015   Chronic knee pain  (1ry area of Pain) (Bilateral) (R>L) 07/29/2015   Chronic low back pain (3ry area of Pain) (Bilateral) (R>L) w/o sciatica 07/29/2015   Lumbar facet syndrome (Bilateral) (R>L) 07/29/2015   Secondary osteoarthritis of multiple sites 07/29/2015   Grade 1  Anterolisthesis of L3/L4 & L4/L5 (1.4 cm) 07/29/2015   Chronic hip pain s/p THR (2ry area of pain) (Bilateral) 07/29/2015   S/P THR (total hip replacement) (Left) 07/29/2015   History of methicillin resistant staphylococcus aureus (MRSA) 07/29/2015   History of bariatric surgery 07/29/2015   History of methicillin resistant Staphylococcus aureus infection 07/29/2015   Eczema 05/21/2015   Headache, migraine 05/21/2015   Bilateral polycystic ovarian syndrome 05/21/2015   Disease of thyroid  gland 05/21/2015   Major depressive disorder, single episode, unspecified 05/21/2015   Dermatitis 05/21/2015   Obstructive apnea 04/30/2015   History of asthma 03/10/2015   Binge eating disorder 03/10/2015   Fibromyalgia 03/10/2015   Anxiety, generalized 03/10/2015   Insomnia, persistent 03/10/2015   Depression, major, recurrent, moderate (HCC) 03/10/2015   Avitaminosis D 04/30/2014   Hyperlipidemia, unspecified 04/25/2014   Hyperlipidemia due to type 2 diabetes mellitus (HCC) 04/25/2014   Uncomplicated asthma 03/19/2014   Asthma 03/19/2014   Goiter, nontoxic, multinodular 10/09/2013   Apnea, sleep 12/05/2012   History of surgical procedure 12/05/2012   Type 2 diabetes mellitus without complication, without long-term current use of insulin  (HCC) 12/05/2012   Hypertension associated with diabetes (HCC) 12/05/2012   Other specified postprocedural states 12/05/2012    PCP: Yehuda Helms, MD  REFERRING PROVIDER: Sharla Davis, NP  REFERRING DIAG: I89.0  THERAPY DIAG:  Lymphedema, not elsewhere classified  Rationale for Evaluation and Treatment: Rehabilitation  ONSET DATE: "years ago"  SUBJECTIVE:  SUBJECTIVE STATEMENT: Ms Ferrentino presents to OT for initial Rx visit addressing BLE lymphedema. Pt arrives riding a motorized scooter. Pt has no new complaints since initial evaluation. 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   (INITIAL OT EVAL : TALENA NEIRA is referred to Occupational Therapy by Sharla Davis, NP for evaluation and treatment of BLE lymphedema. This Pt is known to this OT as I evaluated her on 09/27/19 for the same referring provider. Pt states she wants to know if there's anything new that could assist w her leg swelling. Pt reports chronic, progressive leg swelling for years.Pt endorses positive leg swelling in her father. Pt has not undergone lymphedema treatment in the past and she does not wear compression stockings. She underwent Roux-en-y gastric bypass in 2018 and paniculectomy in 2019.  Pt is awaiting delivery of a pneumatic compression device for her legs - brand and type unknown. Pt reports she is taking Ozempic and Topamax  and trying to exercise more to lose more weight. She states she is fairly satisfied with her current weight. Pt's goals for OT are to learn about any new treatment options for lymphedema and to control and limit progression of LE swelling.  PERTINENT HISTORY: Relevant to Lymphedema Fibromyalgia Long term current use of opiate analgesic Long term prescription opiate use Opiate use Opiate dependence (HCC) Chronic knee pain (1ry area of Pain) (Bilateral) (R>L) Chronic low back pain (3ry area of Pain) (Bilateral) (R>L) w/o sciatica Lumbar facet syndrome (Bilateral) (R>L) Secondary osteoarthritis of multiple sites Grade 1  Anterolisthesis of L3/L4 & L4/L5 (1.4 cm) Chronic hip pain s/p THR (2ry area of pain) (Bilateral) Class 3  severe obesity due to excess calories with serious comorbidity and body mass index (BMI) of 60.0 to 69.9 in adult [facility]) Lumbar spondylosis Chronic pain syndrome Neurogenic pain Chronic shoulder pain (Left) Osteoarthritis of shoulder (Left) Osteoarthritis of hip (Right) Secondary Osteoarthritis of knee (Bilateral) (R>L) Spondylosis without myelopathy or radiculopathy, lumbosacral region History of gastric bypass Pharmacologic therapy Bilateral primary osteoarthritis of knee Osteoarthritis of lumbar spine without myelopathy or radiculopathy Lumbosacral radiculopathy at S1 (Left) Tricompartment osteoarthritis of knees (Bilateral) Lumbosacral radiculopathy at L2 (Right) Chronic thigh pain (Right) Weakness of leg (Right) Lumbar interspinous bursitis (L2-L5) Spinal enthesopathy of lumbar region [facility]) Lumbar facet arthropathy (Multilevel) (Bilateral) Lumbar central spinal stenosis, w/o claudication (L2-3, L4-5, L5-S1) Lumbar foraminal stenosis (Bilateral: L2-3, L4-5) (L>R) Lumbosacral lateral recess stenosis (Bilateral: L4-5, L5-S1) Lumbar facet joint pain DDD (degenerative disc disease), lumbosacral Cervical radiculopathy at C6 Cervicalgia DDD (degenerative disc disease), cervical History of asthma Binge eating disorder Anxiety, generalized Insomnia, persistent Depression, major, recurrent, moderate (HCC) Uncomplicated asthma Eczema Obstructive apnea Bilateral polycystic ovarian syndrome Apnea, sleep Disease of thyroid  gland Type 2 diabetes mellitus without complication, without long-term current use of insulin  (HCC) Hypertension associated with diabetes (HCC) S/P THR (total hip replacement) (Left) History of methicillin resistant staphylococcus aureus (MRSA) History of bariatric surgery History of methicillin resistant Staphylococcus aureus infection Chest pain with low risk of acute coronary syndrome Other psychoactive substance dependence, uncomplicated (HCC) SOB  (shortness of breath) on exertion Upper extremity pain Controlled drug dependence (HCC) Hyperlipidemia due to type 2 diabetes mellitus (HCC) Post op infection Major depressive disorder, single episode, unspecified Depression Dermatitis Asthma Panniculitis Surgical wound, non healing Mycobacterium abscessus infection Wound infection after surgery Iron  deficiency anemia, unspecified Leg swelling Adult failure to thrive syndrome Edema of both  legs Elevated LFTs Hypocalcemia Atypical chest pain Heart palpitations Type II diabetes mellitus with complication (HCC) Lymphedema History of total hip replacement (Bilateral) S/P THR (total hip replacement) (Right) (11/27/2019) Pain and numbness of left upper extremity Cervical radiculitis (Left) Uncomplicated opioid dependence (HCC) Aftercare following joint replacement surgery Barrett's esophagus without dysplasia GERD (gastroesophageal reflux disease) Chronic use of opiate for therapeutic purpose Difficulty sleeping Abnormal MRI, lumbar spine (07/01/2022) Pre-syncope AKI (acute kidney injury) (HCC) Presence of artificial hip joint, bilateral Body mass index (BMI) 70 or greater, adult (HCC) Morbid (severe) obesity due to excess calories (HCC) Essential (primary) hypertension    PAIN:  Are you having pain? Yes: NPRS scale: not rated numerically Pain location: B LE Pain description: heavy, sore Aggravating factors: standing, walking Relieving factors: moving, leg elevation  PRECAUTIONS: Other: LYMPHEDEMA PRECAUTIONS: cardiac, pulmonary, thyroid   RED FLAGS: Hx cellulitis, non-healing wounds, BMI 59.70 k/m2    WEIGHT BEARING RESTRICTIONS: No  FALLS:  Has patient fallen in last 6 months? No  LIVING ENVIRONMENT: Lives with: lives with their partner Lives in: House/apartment   OCCUPATION: disability, unemployed  LEISURE: TBA  HAND DOMINANCE: right   PRIOR LEVEL OF FUNCTION: Independent with household mobility with  device, Independent with community mobility with device, Requires assistive device for independence, Needs assistance with ADLs, Needs assistance with homemaking, and Needs assistance with gait  PATIENT GOALS: to learn about any new treatment options for lymphedema and to control and limit progression of LE swelling.  OBJECTIVE: Note: Objective measures were completed at Evaluation unless otherwise noted.  COGNITION:  Overall cognitive status: Within functional limits for tasks assessed   OBSERVATIONS / OTHER ASSESSMENTS: Severe BLE lipo-lymphedema 2/2 Lipedema and severe stage II, obesity-induced lymphedema  LE ROM: limited at hips, knees and ankles 2/2 body habitus and skin approximation  LE MMT: WFL  LYMPHEDEMA ASSESSMENTS:  SURGERY TYPE/DATE: Non-cancer related, N/A  INFECTIONS: + hx of cellulitis  GAIT: Distance walked: riding scooter Assistive device utilized: None Level of assistance: Modified independence and    LYMPHEDEMA LIFE IMPACT SCALE (LLIS): NT  PSYCHOSOCIAL CONCERNS: Lymphedema affects my body image (i.e.. "How I think I look") Lymphedema "gets me down" (i.e.. I have feelings of depression, frustration, or anger due to the lymphedema)  TREATMENT THIS DATE:   Pt edu   PATIENT EDUCATION:  Discussed differential diagnoses for various swelling disorders. Provided basic level education regarding lymphatic structure and function, etiology, onset patterns, stages of progression, and prevention to limit infection risk, worsening condition and further functional decline. Pt edu for aught interaction between blood circulatory system and lymphatic circulation.Discussed  impact of gravity and co-morbidities on lymphatic function. Outlined Complete Decongestive Therapy (CDT)  as standard of care and provided in depth information regarding 4 primary components of Intensive and Self Management Phases, including Manual Lymph Drainage (MLD), compression wrapping and garments,  skin care, and therapeutic exercise. Samuel Crock discussion with re need for frequent attendance and high burden of care when caregiver is needed, impact of co morbidities. We discussed  the chronic, progressive nature of lymphedema and Importance of daily, ongoing LE self-care essential for limiting progression and infection risk.  Person educated: Patient  Education method: Explanation, Demonstration, and Handouts Education comprehension: verbalized understanding, returned demonstration, verbal cues required, and needs further education  LYMPHEDEMA self-care HOME PROGRAM: Lymphedema Self-Care Home Program: Simple self-MLD1/daily to affected quadrant and body part At least 2 x daily BLE Lymphatic Pumping There ex 1 set of 10 reps each, in order, bilaterally Daily skin care to  affected body part to limit infection risk and increase skin excursion Compression Intensive stage compression: multilayer short stretch wraps with gradient techniques. One limb at a time. Length patient dependent. Self-management Phase: Appropriate daytime compression garment and hours-of-sleep device  Custom-made gradient compression garments and hours-of-sleep devices are medically necessary because they are uniquely sized and shaped to fit the exact dimensions of the affected extremities and to provide accurate and consistent gradient compression and containment, essential  for optimally managing chronic, progressive lymphedema. The convoluted HOS devices are medically necessary to facilitate increased lymphatic circulation and limit fibrosis formation when sleeping. Multiple custom compression garments are needed for optimal hygiene to limit infection risk. Custom compression garments should be replaced q 3-6 months When worn consistently for optimal lymphedema self-management over time.  ASSESSMENT:  CLINICAL IMPRESSION: Visit emphasis on Pt edu for recommended custom compression knee highs paired with OTS lighter compression   leggings in Capri length. Reviewed types of garment fabrics and construction. Reviewed pros and cons of flat knit vs circular knit including how these fabrics perform when  donning/ doffing, fit, cost, wear regime, laundry care and insurance coverage. Identified optimal garment combination    for this point in time, which will include  Sigvaris Compresshorts in Capri length (10-15 mmHg) for non-medical grade containment and compression of hips, thighs and abdomen and calves To be worn over lapping custom, flat knit, Jobst, Elvarex CLASSIC, knee highs providing class 2 (23-34 mmHg)  flat knit compression Jobst manufacturer's rep is coming to clinic on mat 1 to assist with garment measurement to ensure optimal fit. Cont as per POC.   (OT INITIAL EVAL: SURYA FOLDEN is a 53 yo female presenting with severe BLE lipo-lymphedema 2/2 Lipedema, and severe stage II, obesity-induced lymphedema (OIL). Of bilateral lower extremities and lower quadrants. These combined and complex diagnoses are  difficult to treat since the lipedema aspect does not typically respond to gold standard lymphedema treatment, Complete Decongestive Therapy (CDT), and the lymphedema aspect may respond to CDT, but only marginally. Complete Decongestive Therapy (CDT) consists of 2 phases. CDT includes daily manual lymphatic drainage (MLD), skin care and therapeutic  lymphatic pumping exercises. During the Intensive Phase of CDT, multilayer, gradient compression bandages are applied daily to one leg at a time to reduce limb volume. Once limb volume reduction reaches a clinical plateau Pt is fitted with appropriate compression garments and Self-Management Phase of CDT begins.   LIPO-LYMPHEDEMA: First described in the 1940's, lipedema is a chronic condition that presents as symmetrical accumulation of fat in the subcutaneous tissue with almost exclusive occurrence in women. Fat tissue expands primarily in the lower limbs, from buttocks to ankles, as  well as in the arms, with weight loss strategies exhibiting limited effects on limb fat. Lipedema is further characterized by pain, tenderness upon contact, easy bruising, swelling (edema), and psychosocial issues that all impact a patient's overall quality of life. Onset of lipedema is reported to occur during puberty. However, it can occur or be exacerbated by periods of hormonal shifts, such as pregnancy or menopause, although data as to why are limited.  Weight gain and obesity are also considered to play a role in lipedema onset and exacerbation of symptoms.  The fundamental treatment for OIL is weight loss. Resection of areas of massive weight gain and localized lymphatic lobules may be considered, and lower extremity volume reductions are performed when the BMI is lowered to <40 to reduce complications and recurrence. In this case, history of DM 2  and non-healing wounds may preclude this action. To be successful with CDT and lymphedema self-management to limit progression over time, this Pt will benefit from massive weight loss. Once weight loss is achieved Pt may not still need daily caregiver assistance to apply multilayer, gradient compression wraps, and later, custom compression garments, or alternative devices. The burden of care is high with CDT in both phases when Pt is unable to reach feet and distal legs to wrap and / or don /doff compression garments.  At present [employer] prognosis is not a good candidate or CDT due to her BMI of nearly 60 (of 59.7 kg/m2) . Pt will benefit from ongoing weight loss and daily use of an advanced sequential compression device to facilitate improved lymphatic decongestion and reduced pain/ discomfort in her legs and lower body. Pt tells me that this is in the works  with Sharla Davis and [employer]. OT emailed the manufacturer's rep to follow up. Pt will also benefit from compression garments to assist with lymphedema management and to reduce pain.Pt should  be fitted with:  BLE, custom , Jobst , flat knit , ELVAREX CLASSIC, ccl 2 ( 23-32 mmHg) knee high compression stockings to be worn under Sigvaris 10-15 mmHg  Capri length, compression shorts for full time daily use.  Custom-made gradient compression garments are medically necessary because they are uniquely sized and shaped to fit the exact dimensions of the affected extremities, and to provide appropriate medical grade, graduated compression essential for optimally management of chronic, progressive lymphedema. Multiple custom compression garments are needed to ensure proper hygiene to limit infection risk. Custom compression garments should be replaced q 3-6 months When worn consistently for optimal lipo-lymphedema self-management over time. HOS devices, medically necessary to limit fibrosis buildup in tissue, should be replaced q 2 years and PRN when worn out.       OBJECTIVE IMPAIRMENTS: Abnormal gait, decreased activity tolerance, decreased balance, decreased endurance, decreased knowledge of condition, decreased knowledge of use of DME, decreased mobility, difficulty walking, decreased ROM, decreased strength, increased edema, impaired flexibility, impaired sensation, impaired UE functional use, postural dysfunction, obesity, and pain.   ACTIVITY LIMITATIONS: Basic and Instrumental ADLs (meal prep, cleaning, laundry, driving, shopping, community activity, occupation, yard work, school, church ), Functional ambulation and mobility (carrying, lifting, bending, sitting, standing, squatting, stairs, transfers, and bed mobility),   PARTICIPATION LIMITATIONS: Participation in community activities, decreased social participation  PERSONAL FACTORS: Age, Behavior pattern, Fitness, Past/current experiences, Time since onset of injury/illness/exacerbation, and 3+ comorbidities: complex constellation of chronic   are also affecting patient's functional outcome.   REHAB POTENTIAL: Poor    EVALUATION  COMPLEXITY: High   GOALS: Goals reviewed with patient? Yes  SHORT TERM GOALS: Target date: 12/22/23  Pt presents with limited knowledge of impact of body weight and BMI on lymphedema exacerbation, progression, and functional performance. Pt will understand impact of body fat accumulation and distribution 2/2 Lipedema and Lipo-Lymphedema on lymphatic function to improve compliance with lymphedema self management over time. Baseline: Dependent Goal status: INITIAL; ONGOING  PLAN:  OT FREQUENCY:  N/Apt discharged this date  OT DURATION: other: N/A Pt discharged from OT for CDT this date  PLANNED INTERVENTIONS: 97535- Self Care and Patient/Family education  PLAN FOR NEXT SESSION: N/A Pt discharged from OT for CDT this date   Arnold Bicker, MS, OTR/L, CLT-LANA 01/06/24 11:18 AM

## 2024-01-12 ENCOUNTER — Encounter: Payer: Self-pay | Admitting: Occupational Therapy

## 2024-01-12 ENCOUNTER — Ambulatory Visit: Attending: Nurse Practitioner | Admitting: Occupational Therapy

## 2024-01-12 DIAGNOSIS — I890 Lymphedema, not elsewhere classified: Secondary | ICD-10-CM | POA: Diagnosis not present

## 2024-01-12 NOTE — Therapy (Signed)
 OUTPATIENT OCCUPATIONAL THERAPY  TREATMENT  BILATERAL LOWER EXTREMITY LYMPHEDEMA   Patient Name: Karen Lewis MRN: [id number] DOB:08-May-1971, 53 y.o., female Today's Date: 01/12/2024  END OF SESSION:   OT End of Session - 01/12/24 1045     Visit Number 3    Number of Visits 12    Date for OT Re-Evaluation 03/22/24    OT Start Time 1000    OT Stop Time 1045    OT Time Calculation (min) 45 min    Activity Tolerance Patient tolerated treatment well;No increased pain    Behavior During Therapy WFL for tasks assessed/performed                     Past Medical History:  Diagnosis Date   Anemia    Anginal pain (HCC)    Anxiety    Arthralgia of hip 07/29/2015   Arthritis    Arthritis, degenerative 07/29/2015   Asthma    Cephalalgia 07/25/2014   Dependence on unknown drug (HCC)    multiplt controlled drug dependence   Depression    Diabetes mellitus without complication (HCC)    Difficult intubation    per pt needs small tube (has had abrasions from tube in past)   Dysrhythmia    PVC's   Eczema    Fibromyalgia    Gastritis    GERD (gastroesophageal reflux disease)    Gonalgia 07/29/2015   Overview:  Overview:  The patient has had bilateral intra-articular Hyalgan injections done on 07/16/2014 and although she seems to do well with this type of therapy, apparently her insurance company does not want to pay for they Hyalgan. On 11/27/2014 the patient underwent a bilateral genicular nerve block with excellent results. On 01/28/2015 she had a right knee genicular radiofrequency ablatio   Gout    H/O cardiovascular disorder 03/10/2015   H/O surgical procedure 12/05/2012   Overview:  LSG ([facility] - April 2013)     H/O thyroid  disease 03/10/2015   Headache    Herpes    History of artificial joint 07/29/2015   History of hiatal hernia    Hypertension    Hypomagnesemia    Hypothyroidism    IDA (iron  deficiency anemia) 05/28/2019   LBP (low back pain) 07/29/2015    Neuromuscular disorder (HCC)    Obesity    PCOS (polycystic ovarian syndrome)    Primary osteoarthritis of both knees 07/29/2015   Sleep apnea    not using a Cpap machine at this time - most recent test mild apnea does not qualify for test (not OSA)   Thyroid  nodule bilateral   Umbilical hernia    Past Surgical History:  Procedure Laterality Date   APPENDECTOMY     CARPAL TUNNEL RELEASE Bilateral    CHOLECYSTECTOMY     COLONOSCOPY WITH PROPOFOL  N/A 02/11/2021   Procedure: COLONOSCOPY WITH PROPOFOL ;  Surgeon: Toledo, Alphonsus Jeans, MD;  Location: [facility];  Service: Gastroenterology;  Laterality: N/A;   DIAGNOSTIC LAPAROSCOPY     ESOPHAGOGASTRODUODENOSCOPY (EGD) WITH PROPOFOL  N/A 02/11/2021   Procedure: ESOPHAGOGASTRODUODENOSCOPY (EGD) WITH PROPOFOL ;  Surgeon: Toledo, Alphonsus Jeans, MD;  Location: [facility];  Service: Gastroenterology;  Laterality: N/A;   HIATAL HERNIA REPAIR     JOINT REPLACEMENT Bilateral hip   LAPAROSCOPIC PARTIAL GASTRECTOMY     left trigger finger     peniculectomy N/A 07/05/2018   ROUX-EN-Y GASTRIC BYPASS  06/03/2017   SHOULDER ARTHROSCOPY Right    THYROIDECTOMY N/A 11/12/2015   Procedure: THYROIDECTOMY;  Surgeon:  Von Grumbling, MD;  Location: [facility];  Service: ENT;  Laterality: N/A;   TOTAL HIP ARTHROPLASTY Right 11/27/2019   Procedure: TOTAL HIP ARTHROPLASTY ANTERIOR APPROACH;  Surgeon: Molli Angelucci, MD;  Location: [facility];  Service: Orthopedics;  Laterality: Right;   TRIGGER FINGER RELEASE Right    TRIGGER FINGER RELEASE Right 04/24/2020   Procedure: Right ring and middle trigger release;  Surgeon: Molli Angelucci, MD;  Location: [facility];  Service: Orthopedics;  Laterality: Right;   Patient Active Problem List   Diagnosis Date Noted   Cervical radiculopathy at C6 12/26/2023   Cervicalgia 12/26/2023   DDD (degenerative disc disease), cervical 12/26/2023   Skin laxity 11/30/2023   DDD (degenerative disc disease), lumbosacral 09/11/2023   Lumbar facet joint  pain 07/07/2023   AKI (acute kidney injury) (HCC) 08/08/2022   Pre-syncope 08/07/2022   Trigger point of right shoulder region 05/24/2022   Lumbar interspinous bursitis (L2-L5) 04/29/2022   Spinal enthesopathy of lumbar region (HCC) 04/29/2022   Lumbar facet arthropathy (Multilevel) (Bilateral) 04/29/2022   Lumbar central spinal stenosis, w/o claudication (L2-3, L4-5, L5-S1) 04/29/2022   Lumbar foraminal stenosis (Bilateral: L2-3, L4-5) (L>R) 04/29/2022   Lumbosacral lateral recess stenosis (Bilateral: L4-5, L5-S1) 04/29/2022   Lumbosacral radiculopathy at L2 (Right) 04/13/2022   Abnormal MRI, lumbar spine (07/01/2022) 04/13/2022   Chronic thigh pain (Right) 04/13/2022   Weakness of leg (Right) 04/13/2022   Tricompartment osteoarthritis of knees (Bilateral) 03/25/2022   Difficulty sleeping 12/07/2021   Lumbosacral radiculopathy at S1 (Left) 10/26/2021   Osteoarthritis of lumbar spine without myelopathy or radiculopathy 06/03/2021   Chronic use of opiate for therapeutic purpose 01/21/2021   Colon cancer screening 12/04/2020   Barrett's esophagus without dysplasia 12/04/2020   Uncomplicated opioid dependence (HCC) 09/25/2020   Pain and numbness of left upper extremity 03/13/2020   Cervical radiculitis (Left) 03/13/2020   Pharmacologic therapy 02/19/2020   S/P THR (total hip replacement) (Right) (11/27/2019) 02/19/2020   Aftercare following joint replacement surgery 12/23/2019   History of total hip replacement (Bilateral) 11/27/2019   Bilateral primary osteoarthritis of knee 09/14/2019   Presence of artificial hip joint, bilateral 09/14/2019   Body mass index (BMI) 70 or greater, adult (HCC) 09/14/2019   Morbid (severe) obesity due to excess calories (HCC) 09/14/2019   Essential (primary) hypertension 09/14/2019   Lymphedema 08/20/2019   Type II diabetes mellitus with complication (HCC) 08/16/2019   Atypical chest pain 08/13/2019   Heart palpitations 08/13/2019   Leg swelling  07/15/2019   Elevated LFTs 06/07/2019   Edema of both legs 06/04/2019   Hypocalcemia 06/04/2019   Adult failure to thrive syndrome 06/03/2019   Iron  deficiency anemia, unspecified 05/28/2019   Mycobacterium abscessus infection 02/22/2019   Surgical wound, non healing 09/27/2018   Wound infection after surgery 08/11/2018   Panniculitis 07/05/2018   Allergy to iodine 04/26/2018   H/O allergy to shellfish 04/26/2018   Encounter for dental examination 02/21/2018   Spondylosis without myelopathy or radiculopathy, lumbosacral region 02/21/2018   History of gastric bypass 02/15/2018   Depression 01/19/2018   Secondary Osteoarthritis of knee (Bilateral) (R>L) 01/02/2018   Encounter for dental exam and cleaning w/o abnormal findings 12/19/2017   Post op infection 12/05/2017   Osteoarthritis of hip (Right) 07/07/2017   Osteoarthritis of shoulder (Left) 05/31/2017   Upper extremity pain 04/21/2017   Chronic shoulder pain (Left) 04/21/2017   Chronic pain syndrome 12/02/2016   Neurogenic pain 12/02/2016   Chest pain with low risk of acute coronary syndrome 08/04/2016  SOB (shortness of breath) on exertion 08/04/2016   Dysphagia, unspecified 05/04/2016   Gastro-esophageal reflux disease without esophagitis 05/04/2016   GERD (gastroesophageal reflux disease) 05/04/2016   Vitamin D  insufficiency 11/27/2015   Nontoxic goiter 11/12/2015   Other psychoactive substance dependence, uncomplicated (HCC) 10/13/2015   Controlled drug dependence (HCC) 10/13/2015   Lumbar spondylosis 09/18/2015   Hypomagnesemia 08/27/2015   Class 3 severe obesity due to excess calories with serious comorbidity and body mass index (BMI) of 60.0 to 69.9 in adult [facility]) 08/27/2015   Long term current use of opiate analgesic 07/29/2015   Long term prescription opiate use 07/29/2015   Opiate use 07/29/2015   Encounter for therapeutic drug level monitoring 07/29/2015   Opiate dependence (HCC) 07/29/2015   Chronic knee pain  (1ry area of Pain) (Bilateral) (R>L) 07/29/2015   Chronic low back pain (3ry area of Pain) (Bilateral) (R>L) w/o sciatica 07/29/2015   Lumbar facet syndrome (Bilateral) (R>L) 07/29/2015   Secondary osteoarthritis of multiple sites 07/29/2015   Grade 1  Anterolisthesis of L3/L4 & L4/L5 (1.4 cm) 07/29/2015   Chronic hip pain s/p THR (2ry area of pain) (Bilateral) 07/29/2015   S/P THR (total hip replacement) (Left) 07/29/2015   History of methicillin resistant staphylococcus aureus (MRSA) 07/29/2015   History of bariatric surgery 07/29/2015   History of methicillin resistant Staphylococcus aureus infection 07/29/2015   Eczema 05/21/2015   Headache, migraine 05/21/2015   Bilateral polycystic ovarian syndrome 05/21/2015   Disease of thyroid  gland 05/21/2015   Major depressive disorder, single episode, unspecified 05/21/2015   Dermatitis 05/21/2015   Obstructive apnea 04/30/2015   History of asthma 03/10/2015   Binge eating disorder 03/10/2015   Fibromyalgia 03/10/2015   Anxiety, generalized 03/10/2015   Insomnia, persistent 03/10/2015   Depression, major, recurrent, moderate (HCC) 03/10/2015   Avitaminosis D 04/30/2014   Hyperlipidemia, unspecified 04/25/2014   Hyperlipidemia due to type 2 diabetes mellitus (HCC) 04/25/2014   Uncomplicated asthma 03/19/2014   Asthma 03/19/2014   Goiter, nontoxic, multinodular 10/09/2013   Apnea, sleep 12/05/2012   History of surgical procedure 12/05/2012   Type 2 diabetes mellitus without complication, without long-term current use of insulin  (HCC) 12/05/2012   Hypertension associated with diabetes (HCC) 12/05/2012   Other specified postprocedural states 12/05/2012    PCP: Yehuda Helms, MD  REFERRING PROVIDER: Sharla Davis, NP  REFERRING DIAG: I89.0  THERAPY DIAG:  Lymphedema, not elsewhere classified  Rationale for Evaluation and Treatment: Rehabilitation  ONSET DATE: "years ago"  SUBJECTIVE:  SUBJECTIVE STATEMENT: Ms Riesen presents to OT for initial Rx visit addressing BLE lymphedema. Pt arrives riding a motorized scooter. Pt has no new complaints since initial evaluation. 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  Melissa Mears-Burton, manufacturer's rep from [employer], is here at clinic today to assist and collaborate on challenging compression garment measurements.   (INITIAL OT EVAL : Karen Lewis is referred to Occupational Therapy by Sharla Davis, NP for evaluation and treatment of BLE lymphedema. This Pt is known to this OT as I evaluated her on 09/27/19 for the same referring provider. Pt states she wants to know if there's anything new that could assist w her leg swelling. Pt reports chronic, progressive leg swelling for years.Pt endorses positive leg swelling in her father. Pt has not undergone lymphedema treatment in the past and she does not wear compression stockings. She underwent Roux-en-y gastric bypass in 2018 and paniculectomy in 2019.  Pt is awaiting delivery of a pneumatic compression device for her legs - brand and type unknown. Pt reports she is taking Ozempic and Topamax  and trying to exercise more to lose more weight. She states she is fairly satisfied with her current weight. Pt's goals for OT are to learn about any new treatment options for lymphedema and to control and limit progression of LE swelling.  PERTINENT HISTORY: Relevant to Lymphedema Fibromyalgia Long term current use of opiate analgesic Long term prescription opiate use Opiate use Opiate dependence (HCC) Chronic knee pain (1ry area of Pain) (Bilateral) (R>L) Chronic low back pain (3ry area of Pain) (Bilateral) (R>L) w/o sciatica Lumbar facet syndrome (Bilateral) (R>L) Secondary  osteoarthritis of multiple sites Grade 1  Anterolisthesis of L3/L4 & L4/L5 (1.4 cm) Chronic hip pain s/p THR (2ry area of pain) (Bilateral) Class 3 severe obesity due to excess calories with serious comorbidity and body mass index (BMI) of 60.0 to 69.9 in adult [facility]) Lumbar spondylosis Chronic pain syndrome Neurogenic pain Chronic shoulder pain (Left) Osteoarthritis of shoulder (Left) Osteoarthritis of hip (Right) Secondary Osteoarthritis of knee (Bilateral) (R>L) Spondylosis without myelopathy or radiculopathy, lumbosacral region History of gastric bypass Pharmacologic therapy Bilateral primary osteoarthritis of knee Osteoarthritis of lumbar spine without myelopathy or radiculopathy Lumbosacral radiculopathy at S1 (Left) Tricompartment osteoarthritis of knees (Bilateral) Lumbosacral radiculopathy at L2 (Right) Chronic thigh pain (Right) Weakness of leg (Right) Lumbar interspinous bursitis (L2-L5) Spinal enthesopathy of lumbar region [facility]) Lumbar facet arthropathy (Multilevel) (Bilateral) Lumbar central spinal stenosis, w/o claudication (L2-3, L4-5, L5-S1) Lumbar foraminal stenosis (Bilateral: L2-3, L4-5) (L>R) Lumbosacral lateral recess stenosis (Bilateral: L4-5, L5-S1) Lumbar facet joint pain DDD (degenerative disc disease), lumbosacral Cervical radiculopathy at C6 Cervicalgia DDD (degenerative disc disease), cervical History of asthma Binge eating disorder Anxiety, generalized Insomnia, persistent Depression, major, recurrent, moderate (HCC) Uncomplicated asthma Eczema Obstructive apnea Bilateral polycystic ovarian syndrome Apnea, sleep Disease of thyroid  gland Type 2 diabetes mellitus without complication, without long-term current use of insulin  (HCC) Hypertension associated with diabetes (HCC) S/P THR (total hip replacement) (Left) History of methicillin resistant staphylococcus aureus (MRSA) History of bariatric surgery History of methicillin resistant  Staphylococcus aureus infection Chest pain with low risk of acute coronary syndrome Other psychoactive substance dependence, uncomplicated (HCC) SOB (shortness of breath) on exertion Upper extremity pain Controlled drug dependence (HCC) Hyperlipidemia due to type 2 diabetes mellitus (HCC) Post op infection Major depressive disorder, single episode, unspecified Depression Dermatitis Asthma Panniculitis Surgical wound, non healing Mycobacterium abscessus infection Wound infection after surgery Iron   deficiency anemia, unspecified Leg swelling Adult failure to thrive syndrome Edema of both legs Elevated LFTs Hypocalcemia Atypical chest pain Heart palpitations Type II diabetes mellitus with complication (HCC) Lymphedema History of total hip replacement (Bilateral) S/P THR (total hip replacement) (Right) (11/27/2019) Pain and numbness of left upper extremity Cervical radiculitis (Left) Uncomplicated opioid dependence (HCC) Aftercare following joint replacement surgery Barrett's esophagus without dysplasia GERD (gastroesophageal reflux disease) Chronic use of opiate for therapeutic purpose Difficulty sleeping Abnormal MRI, lumbar spine (07/01/2022) Pre-syncope AKI (acute kidney injury) (HCC) Presence of artificial hip joint, bilateral Body mass index (BMI) 70 or greater, adult (HCC) Morbid (severe) obesity due to excess calories (HCC) Essential (primary) hypertension    PAIN:  Are you having pain? Yes: NPRS scale: not rated numerically Pain location: B LE Pain description: heavy, sore Aggravating factors: standing, walking Relieving factors: moving, leg elevation  PRECAUTIONS: Other: LYMPHEDEMA PRECAUTIONS: cardiac, pulmonary, thyroid   RED FLAGS: Hx cellulitis, non-healing wounds, BMI 59.70 k/m2    WEIGHT BEARING RESTRICTIONS: No  FALLS:  Has patient fallen in last 6 months? No  LIVING ENVIRONMENT: Lives with: lives with their partner Lives in:  House/apartment   OCCUPATION: disability, unemployed  LEISURE: TBA  HAND DOMINANCE: right   PRIOR LEVEL OF FUNCTION: Independent with household mobility with device, Independent with community mobility with device, Requires assistive device for independence, Needs assistance with ADLs, Needs assistance with homemaking, and Needs assistance with gait  PATIENT GOALS: to learn about any new treatment options for lymphedema and to control and limit progression of LE swelling.  OBJECTIVE: Note: Objective measures were completed at Evaluation unless otherwise noted.  COGNITION:  Overall cognitive status: Within functional limits for tasks assessed   OBSERVATIONS / OTHER ASSESSMENTS: Severe BLE lipo-lymphedema 2/2 Lipedema and severe stage II, obesity-induced lymphedema  LE ROM: limited at hips, knees and ankles 2/2 body habitus and skin approximation  LE MMT: WFL  LYMPHEDEMA ASSESSMENTS:  SURGERY TYPE/DATE: Non-cancer related, N/A  INFECTIONS: + hx of cellulitis  GAIT: Distance walked: riding scooter Assistive device utilized: None Level of assistance: Modified independence and    LYMPHEDEMA LIFE IMPACT SCALE (LLIS): NT  PSYCHOSOCIAL CONCERNS: Lymphedema affects my body image (i.e.. "How I think I look") Lymphedema "gets me down" (i.e.. I have feelings of depression, frustration, or anger due to the lymphedema)  TREATMENT THIS DATE:  Collaborating with manufacturer's rep from [employer] to ID specification and complete anatomical measurements for custom compression knee highs Pt edu re LE self-care home program   PATIENT EDUCATION:  Continued Pt/ CG edu for lymphedema self care home program throughout session. Topics include outcome of comparative limb volumetrics- starting limb volume differentials (LVDs), technology and gradient techniques used for short stretch, multilayer compression wrapping, simple self-MLD, therapeutic lymphatic pumping exercises, skin/nail care, LE  precautions, compression garment recommendations and specifications, wear and care schedule and compression garment donning / doffing w assistive devices. Discussed progress towards all OT goals since commencing CDT. Discussed detrimental impact of obesity on lower and upper extremity lymphedema over time. Reviewed OT goals for lymphedema care with Pt and discussed progress to date.  All questions answered to the Pt's satisfaction. Good return. Person educated: Patient  Education method: Explanation, Demonstration, and Handouts Education comprehension: verbalized understanding, returned demonstration, verbal cues required, and needs further education  LYMPHEDEMA self-care HOME PROGRAM: Lymphedema Self-Care Home Program: Simple self-MLD1/daily to affected quadrant and body part At least 2 x daily BLE Lymphatic Pumping There ex 1 set of 10 reps each, in order, bilaterally  Daily skin care to affected body part to limit infection risk and increase skin excursion Compression Intensive stage compression: multilayer short stretch wraps with gradient techniques. One limb at a time. Length patient dependent. Self-management Phase: Appropriate daytime compression garment and hours-of-sleep device  Custom-made gradient compression garments and hours-of-sleep devices are medically necessary because they are uniquely sized and shaped to fit the exact dimensions of the affected extremities and to provide accurate and consistent gradient compression and containment, essential  for optimally managing chronic, progressive lymphedema. The convoluted HOS devices are medically necessary to facilitate increased lymphatic circulation and limit fibrosis formation when sleeping. Multiple custom compression garments are needed for optimal hygiene to limit infection risk. Custom compression garments should be replaced q 3-6 months When worn consistently for optimal lymphedema self-management over  time.  ASSESSMENT:  CLINICAL IMPRESSION: Visit emphasis on Pt edu for recommended custom compression knee highs paired with OTS lighter compression  leggings in Capri length. Reviewed types of garment fabrics and construction. Reviewed pros and cons of flat knit vs circular knit including how these fabrics perform when  donning/ doffing, fit, cost, wear regime, laundry care and insurance coverage. Identified optimal garment combination    for this point in time, which will include  Sigvaris Compresshorts in Capri length (10-15 mmHg) for non-medical grade containment and compression of hips, thighs and abdomen and calves To be worn over lapping custom, flat knit, [employer], Elvarex CLASSIC, knee highs providing class 2 (23-34 mmHg)  flat knit compression [employer] manufacturer's rep is coming to clinic on mat 1 to assist with garment measurement to ensure optimal fit. Cont as per POC.   (OT INITIAL EVAL: Karen Lewis is a 53 yo female presenting with severe BLE lipo-lymphedema 2/2 Lipedema, and severe stage II, obesity-induced lymphedema (OIL). Of bilateral lower extremities and lower quadrants. These combined and complex diagnoses are  difficult to treat since the lipedema aspect does not typically respond to gold standard lymphedema treatment, Complete Decongestive Therapy (CDT), and the lymphedema aspect may respond to CDT, but only marginally. Complete Decongestive Therapy (CDT) consists of 2 phases. CDT includes daily manual lymphatic drainage (MLD), skin care and therapeutic  lymphatic pumping exercises. During the Intensive Phase of CDT, multilayer, gradient compression bandages are applied daily to one leg at a time to reduce limb volume. Once limb volume reduction reaches a clinical plateau Pt is fitted with appropriate compression garments and Self-Management Phase of CDT begins.   LIPO-LYMPHEDEMA: First described in the 1940's, lipedema is a chronic condition that presents as symmetrical accumulation  of fat in the subcutaneous tissue with almost exclusive occurrence in women. Fat tissue expands primarily in the lower limbs, from buttocks to ankles, as well as in the arms, with weight loss strategies exhibiting limited effects on limb fat. Lipedema is further characterized by pain, tenderness upon contact, easy bruising, swelling (edema), and psychosocial issues that all impact a patient's overall quality of life. Onset of lipedema is reported to occur during puberty. However, it can occur or be exacerbated by periods of hormonal shifts, such as pregnancy or menopause, although data as to why are limited.  Weight gain and obesity are also considered to play a role in lipedema onset and exacerbation of symptoms.  The fundamental treatment for OIL is weight loss. Resection of areas of massive weight gain and localized lymphatic lobules may be considered, and lower extremity volume reductions are performed when the BMI is lowered to <40 to reduce complications and recurrence. In this case,  history of DM 2 and non-healing wounds may preclude this action. To be successful with CDT and lymphedema self-management to limit progression over time, this Pt will benefit from massive weight loss. Once weight loss is achieved Pt may not still need daily caregiver assistance to apply multilayer, gradient compression wraps, and later, custom compression garments, or alternative devices. The burden of care is high with CDT in both phases when Pt is unable to reach feet and distal legs to wrap and / or don /doff compression garments.  At present [employer] prognosis is not a good candidate or CDT due to her BMI of nearly 60 (of 59.7 kg/m2) . Pt will benefit from ongoing weight loss and daily use of an advanced sequential compression device to facilitate improved lymphatic decongestion and reduced pain/ discomfort in her legs and lower body. Pt tells me that this is in the works  with Sharla Davis and [employer]. OT  emailed the manufacturer's rep to follow up. Pt will also benefit from compression garments to assist with lymphedema management and to reduce pain.Pt should be fitted with:  BLE, custom , [employer] , flat knit , ELVAREX CLASSIC, ccl 2 ( 23-32 mmHg) knee high compression stockings to be worn under Sigvaris 10-15 mmHg  Capri length, compression shorts for full time daily use.  Custom-made gradient compression garments are medically necessary because they are uniquely sized and shaped to fit the exact dimensions of the affected extremities, and to provide appropriate medical grade, graduated compression essential for optimally management of chronic, progressive lymphedema. Multiple custom compression garments are needed to ensure proper hygiene to limit infection risk. Custom compression garments should be replaced q 3-6 months When worn consistently for optimal lipo-lymphedema self-management over time. HOS devices, medically necessary to limit fibrosis buildup in tissue, should be replaced q 2 years and PRN when worn out.       OBJECTIVE IMPAIRMENTS: Abnormal gait, decreased activity tolerance, decreased balance, decreased endurance, decreased knowledge of condition, decreased knowledge of use of DME, decreased mobility, difficulty walking, decreased ROM, decreased strength, increased edema, impaired flexibility, impaired sensation, impaired UE functional use, postural dysfunction, obesity, and pain.   ACTIVITY LIMITATIONS: Basic and Instrumental ADLs (meal prep, cleaning, laundry, driving, shopping, community activity, occupation, yard work, school, church ), Functional ambulation and mobility (carrying, lifting, bending, sitting, standing, squatting, stairs, transfers, and bed mobility),   PARTICIPATION LIMITATIONS: Participation in community activities, decreased social participation  PERSONAL FACTORS: Age, Behavior pattern, Fitness, Past/current experiences, Time since onset of  injury/illness/exacerbation, and 3+ comorbidities: complex constellation of chronic   are also affecting patient's functional outcome.   REHAB POTENTIAL: Poor    EVALUATION COMPLEXITY: High   GOALS: Goals reviewed with patient? Yes  SHORT TERM GOALS: Target date: 12/22/23  Pt presents with limited knowledge of impact of body weight and BMI on lymphedema exacerbation, progression, and functional performance. Pt will understand impact of body fat accumulation and distribution 2/2 Lipedema and Lipo-Lymphedema on lymphatic function to improve compliance with lymphedema self management over time. Baseline: Dependent Goal status: INITIAL; ONGOING  PLAN:  OT FREQUENCY:  N/Apt discharged this date  OT DURATION: other: N/A Pt discharged from OT for CDT this date  PLANNED INTERVENTIONS: 97535- Self Care and Patient/Family education  PLAN FOR NEXT SESSION: N/A Pt discharged from OT for CDT this date   Arnold Bicker, MS, OTR/L, CLT-LANA 01/12/24 10:47 AM

## 2024-01-17 ENCOUNTER — Other Ambulatory Visit: Payer: Self-pay | Admitting: Orthopedic Surgery

## 2024-01-17 NOTE — Progress Notes (Unsigned)
 PROVIDER NOTE: Interpretation of information contained herein should be left to medically-trained personnel. Specific patient instructions are provided elsewhere under "Patient Instructions" section of medical record. This document was created in part using AI and STT-dictation technology, any transcriptional errors that may result from this process are unintentional.  Patient: Karen Lewis  Service: E/M   PCP: Yehuda Helms, MD  DOB: [birth year]  DOS: 01/18/2024  Provider: Candi Chafe, MD  MRN: [id number]  Delivery: Face-to-face  Specialty: Interventional Pain Management  Type: Established Patient  Setting: Ambulatory outpatient facility  Specialty designation: 09  Referring Prov.: Yehuda Helms, MD  Location: Outpatient office facility       HPI  Karen Lewis, a 53 y.o. year old female, is here today because of her No primary diagnosis found.. Karen Lewis primary complain today is No chief complaint on file.  Pertinent problems: Karen Lewis has Fibromyalgia; Chronic knee pain (1ry area of Pain) (Bilateral) (R>L); Chronic low back pain (3ry area of Pain) (Bilateral) (R>L) w/o sciatica; Lumbar facet syndrome (Bilateral) (R>L); Secondary osteoarthritis of multiple sites; Grade 1  Anterolisthesis of L3/L4 & L4/L5 (1.4 cm); Chronic hip pain s/p THR (2ry area of pain) (Bilateral); S/P THR (total hip replacement) (Left); Lumbar spondylosis; Chronic pain syndrome; Neurogenic pain; Upper extremity pain; Chronic shoulder pain (Left); Osteoarthritis of shoulder (Left); Osteoarthritis of hip (Right); Secondary Osteoarthritis of knee (Bilateral) (R>L); Spondylosis without myelopathy or radiculopathy, lumbosacral region; Panniculitis; Leg swelling; Edema of both legs; History of total hip replacement (Bilateral); S/P THR (total hip replacement) (Right) (11/27/2019); Pain and numbness of left upper extremity; Cervical radiculitis (Left); Aftercare following joint replacement surgery; Bilateral primary  osteoarthritis of knee; Osteoarthritis of lumbar spine without myelopathy or radiculopathy; Lumbosacral radiculopathy at S1 (Left); Tricompartment osteoarthritis of knees (Bilateral); Lumbosacral radiculopathy at L2 (Right); Abnormal MRI, lumbar spine (07/01/2022); Chronic thigh pain (Right); Weakness of leg (Right); Lumbar interspinous bursitis (L2-L5); Spinal enthesopathy of lumbar region [facility]); Lumbar facet arthropathy (Multilevel) (Bilateral); Lumbar central spinal stenosis, w/o claudication (L2-3, L4-5, L5-S1); Lumbar foraminal stenosis (Bilateral: L2-3, L4-5) (L>R); Lumbosacral lateral recess stenosis (Bilateral: L4-5, L5-S1); Trigger point of right shoulder region; Presence of artificial hip joint, bilateral; Lumbar facet joint pain; DDD (degenerative disc disease), lumbosacral; Cervical radiculopathy at C6; Cervicalgia; and DDD (degenerative disc disease), cervical on their pertinent problem list. Pain Assessment: Severity of   is reported as a  /10. Location:    / . Onset:  . Quality:  . Timing:  . Modifying factor(s):  Aaron Aas Vitals:  vitals were not taken for this visit.  BMI: Estimated body mass index is 59.7 kg/m as calculated from the following:   Height as of 12/26/23: 5\' 3"  (1.6 m).   Weight as of 12/26/23: 337 lb (152.9 kg). Last encounter: 12/26/2023. Last procedure: 07/07/2023.  Reason for encounter: follow-up evaluation to review results of MRI.  (Not available as of 01/17/2024)  Discussed the use of AI scribe software for clinical note transcription with the patient, who gave verbal consent to proceed.  History of Present Illness           Pharmacotherapy Assessment  Analgesic: Oxycodone  IR 5 mg 1 tab PO TID (#90/mo) (15 mg/day) MME/day: 22.5 mg/day.   Monitoring:  PMP: PDMP reviewed during this encounter.       Pharmacotherapy: No side-effects or adverse reactions reported. Compliance: No problems identified. Effectiveness: Clinically acceptable.  No notes on file  No  results found for: "CBDTHCR" No results found for: "D8THCCBX" No  results found for: "D9THCCBX"  UDS:  Summary  Date Value Ref Range Status  06/08/2023 Note  Final    Comment:    ==================================================================== [employer] Select 13 (MW) ==================================================================== Test                             Result       Flag       Units  Drug Present and Declared for Prescription Verification   7-aminoclonazepam              68           EXPECTED   ng/mg creat    7-aminoclonazepam is an expected metabolite of clonazepam . Source of    clonazepam  is a scheduled prescription medication.    Oxycodone                       416          EXPECTED   ng/mg creat   Oxymorphone                    108          EXPECTED   ng/mg creat   Noroxycodone                   1904         EXPECTED   ng/mg creat   Noroxymorphone                 45           EXPECTED   ng/mg creat    Sources of oxycodone  are scheduled prescription medications.    Oxymorphone, noroxycodone, and noroxymorphone are expected    metabolites of oxycodone . Oxymorphone is also available as a    scheduled prescription medication.    Butalbital                      PRESENT      EXPECTED ==================================================================== Test                      Result    Flag   Units      Ref Range   Creatinine              262              mg/dL      >=40 ==================================================================== Declared Medications:  The flagging and interpretation on this report are based on the  following declared medications.  Unexpected results may arise from  inaccuracies in the declared medications.   **Note: The testing scope of this panel includes these medications:   Butalbital  (Fioricet )  Clonazepam  (Klonopin )  Oxycodone    **Note: The testing scope of this panel does not include the  following reported medications:    Acetaminophen   Acetaminophen  (Fioricet )  Albuterol  (Ventolin  HFA)  Amitriptyline  (Elavil )  Biotin   Caffeine  (Fioricet )  Calcium  (Tums)  Diphenhydramine  (Benadryl )  Duloxetine (Cymbalta)  Epinephrine  (EpiPen )  Famotidine  (Pepcid )  Fluticasone  (Breo)  Galcanezumab (Emgality)  Indeterminate Medication  Levocetirizine (Xyzal )  Levothyroxine  (Synthroid )  Lisinopril  (Zestril )  Magnesium  (Mag-Ox)  Meclizine  (Antivert )  Metformin   Metoprolol  (Lopressor )  Montelukast  (Singulair )  Multivitamin  Naloxone  (Narcan )  Nitrofurantoin  (Macrodantin )  Omeprazole (Prilosec)  Phentermine (Adipex)  Pregabalin  (Lyrica )  Rizatriptan  Rosuvastatin  (Crestor )  Semaglutide (Ozempic)  Tizanidine  (Zanaflex )  Topical Diclofenac  (Voltaren )  Topiramate  (Topamax )  Vilanterol (Breo)  Vitamin D2 ==================================================================== For clinical consultation, please call [phone]. ====================================================================       ROS  Constitutional: Denies any fever or chills Gastrointestinal: No reported hemesis, hematochezia, vomiting, or acute GI distress Musculoskeletal: Denies any acute onset joint swelling, redness, loss of ROM, or weakness Neurological: No reported episodes of acute onset apraxia, aphasia, dysarthria, agnosia, amnesia, paralysis, loss of coordination, or loss of consciousness  Medication Review  Alcohol Prep, Biotin , DULoxetine, EPINEPHrine , Galcanezumab-gnlm, QUEtiapine , Semaglutide(0.25 or 0.5MG /DOS), Tab-A-Vite, acetaminophen , acidophilus, albuterol , amitriptyline , butalbital -acetaminophen -caffeine , calcium  carbonate, clonazePAM , diclofenac  sodium, diphenhydrAMINE , ergocalciferol , famotidine , glucose blood, levocetirizine, levothyroxine , lisinopril , magnesium  oxide, meclizine , metFORMIN , metoprolol  tartrate, montelukast , naloxone , naphazoline-pheniramine, nitrofurantoin , omeprazole, oxyCODONE , phentermine,  pregabalin , rizatriptan, rosuvastatin , tiZANidine , and topiramate   History Review  Allergy: Karen Lewis is allergic to bactrim [sulfamethoxazole-trimethoprim], omalizumab, ciprofloxacin, shellfish allergy, and nsaids. Drug: Karen Lewis  reports no history of drug use. Alcohol:  reports no history of alcohol use. Tobacco:  reports that she has never smoked. She has never used smokeless tobacco. Social: Karen Lewis  reports that she has never smoked. She has never used smokeless tobacco. She reports that she does not drink alcohol and does not use drugs. Medical:  has a past medical history of Anemia, Anginal pain (HCC), Anxiety, Arthralgia of hip (07/29/2015), Arthritis, Arthritis, degenerative (07/29/2015), Asthma, Cephalalgia (07/25/2014), Dependence on unknown drug (HCC), Depression, Diabetes mellitus without complication (HCC), Difficult intubation, Dysrhythmia, Eczema, Fibromyalgia, Gastritis, GERD (gastroesophageal reflux disease), Gonalgia (07/29/2015), Gout, H/O cardiovascular disorder (03/10/2015), H/O surgical procedure (12/05/2012), H/O thyroid  disease (03/10/2015), Headache, Herpes, History of artificial joint (07/29/2015), History of hiatal hernia, Hypertension, Hypomagnesemia, Hypothyroidism, IDA (iron  deficiency anemia) (05/28/2019), LBP (low back pain) (07/29/2015), Neuromuscular disorder (HCC), Obesity, PCOS (polycystic ovarian syndrome), Primary osteoarthritis of both knees (07/29/2015), Sleep apnea, Thyroid  nodule (bilateral), and Umbilical hernia. Surgical: Karen Lewis  has a past surgical history that includes Laparoscopic partial gastrectomy; Shoulder arthroscopy (Right); Carpal tunnel release (Bilateral); Diagnostic laparoscopy; Cholecystectomy; Trigger finger release (Right); Thyroidectomy (N/A, 11/12/2015); left trigger finger; Roux-en-Y Gastric Bypass (06/03/2017); Hiatal hernia repair; peniculectomy (N/A, 07/05/2018); Total hip arthroplasty (Right, 11/27/2019); Joint replacement (Bilateral, hip);  Appendectomy; Trigger finger release (Right, 04/24/2020); Colonoscopy with propofol  (N/A, 02/11/2021); and Esophagogastroduodenoscopy (egd) with propofol  (N/A, 02/11/2021). Family: family history includes Alcohol abuse in her father and mother; Anxiety disorder in her father and mother; Brain cancer (age of onset: 82) in her paternal uncle; Breast cancer (age of onset: 40) in her paternal aunt and paternal aunt; COPD in her father; Depression in her brother, father, and mother; Diabetes in her brother, father, and mother; Hypertension in her brother, father, and mother; Kidney cancer in her mother; Kidney failure in her father; Post-traumatic stress disorder in her father; Sleep apnea in her brother, father, and mother.  Laboratory Chemistry Profile   Renal Lab Results  Component Value Date   BUN 17 08/09/2022   CREATININE 1.04 (H) 08/09/2022   GFRAA >60 04/16/2020   GFRNONAA >60 08/09/2022    Hepatic Lab Results  Component Value Date   AST 25 08/07/2022   ALT 40 08/07/2022   ALBUMIN 3.5 08/07/2022   ALKPHOS 74 08/07/2022   LIPASE 37 08/07/2022    Electrolytes Lab Results  Component Value Date   NA 142 08/09/2022   K 4.8 08/09/2022   CL 116 (H) 08/09/2022   CALCIUM  7.8 (L) 08/09/2022   MG 2.5 (H) 08/07/2022   PHOS 5.5 (H) 05/22/2019    Bone Lab Results  Component Value Date   VD25OH 27.4 (L) 11/24/2015   [id number]  41.5 11/24/2015    Inflammation (CRP: Acute Phase) (ESR: Chronic Phase) Lab Results  Component Value Date   CRP 0.7 09/27/2019   ESRSEDRATE 30 (H) 09/27/2019   LATICACIDVEN 1.7 08/07/2022         Note: Above Lab results reviewed.  Recent Imaging Review  DG PAIN CLINIC C-ARM 1-60 MIN NO REPORT Fluoro was used, but no Radiologist interpretation will be provided.  Please refer to "NOTES" tab for provider progress note. Note: Reviewed        Physical Exam  General appearance: Well nourished, well developed, and well hydrated. In no apparent acute  distress Mental status: Alert, oriented x 3 (person, place, & time)       Respiratory: No evidence of acute respiratory distress Eyes: PERLA Vitals: There were no vitals taken for this visit. BMI: Estimated body mass index is 59.7 kg/m as calculated from the following:   Height as of 12/26/23: 5\' 3"  (1.6 m).   Weight as of 12/26/23: 337 lb (152.9 kg). Ideal: Patient weight not recorded  Assessment   Diagnosis Status  No diagnosis found. Controlled Controlled Controlled   Updated Problems: No problems updated.  Plan of Care  Problem-specific:  Assessment and Plan            Karen Lewis has a current medication list which includes the following long-term medication(s): albuterol , diphenhydramine , magnesium  oxide, montelukast , naloxone , oxycodone , [START ON 02/10/2024] oxycodone , [START ON 03/11/2024] oxycodone , rizatriptan, and rizatriptan.  Pharmacotherapy (Medications Ordered): No orders of the defined types were placed in this encounter.  Orders:  No orders of the defined types were placed in this encounter.  Follow-up plan:   No follow-ups on file.     Interventional Therapies  Risk  Complexity Considerations:   Estimated body mass index is 60.59 kg/m as calculated from the following:   Height as of 06/18/21: 5\' 4"  (1.626 m).   Weight as of 06/18/21: 353 lb (160.1 kg).     NOTE: NO RFA until BMI less or equal to 35.  (Gastric bypass done on 06/03/2017) Iodine allergy  Contrast dye allergy  Shellfish allergy    Planned  Pending:   Ambulatory referral to interventional pain management practice for evaluation and possible bilateral lumbar facet medial branch radiofrequency ablation.  (Entered on 07/28/2023)   Under consideration:      Completed:   Therapeutic right L2-3 LESI x1 (04/29/2022) (100/100/60/60)  Therapeutic bilateral IA Monovisc knee inj. x1 (05/25/2022)  Therapeutic right trapezius TPI/MNB x1 (05/25/2022)  Therapeutic bilateral IA Zilretta  knee  inj. x4 (03/25/2022) (100/100/80/80)  Therapeutic bilateral IA Hyalgan knee inj. x16 (06/24/2020) (100/100/60/60)  Palliative/therapeutic bilateral lumbar facet MBB x9 (07/07/2023) (100/100/70/70)  Therapeutic right IA hip injection x3 (07/07/2017) (100/50/25/>50)  Therapeutic left IA shoulder (glenohumeral joint) injection x1 (05/31/2017) (50/0/100/100)    Completed by other providers:   Therapeutic bilateral THR (total hip replacements) (Dr. Mozell Arias) (Right: 11/27/2019) (Left: 07/29/2015)    Therapeutic  Palliative (PRN) options:   Palliative bilateral knee injections  Palliative bilateral lumbar facet MBBs    Pharmacotherapy  Nonopioid transferred 06/24/2020: Lyrica       Recent Visits Date Type Provider Dept  12/26/23 Office Visit Renaldo Caroli, MD [facility]  Showing recent visits within past 90 days and meeting all other requirements Future Appointments Date Type Provider Dept  01/18/24 Appointment Renaldo Caroli, MD [facility]  04/02/24 Appointment Patel, Seema K, NP [facility]  Showing future appointments within next 90 days and meeting all  other requirements  I discussed the assessment and treatment plan with the patient. The patient was provided an opportunity to ask questions and all were answered. The patient agreed with the plan and demonstrated an understanding of the instructions.  Patient advised to call back or seek an in-person evaluation if the symptoms or condition worsens.  Duration of encounter: *** minutes.  Total time on encounter, as per AMA guidelines included both the face-to-face and non-face-to-face time personally spent by the physician and/or other qualified health care professional(s) on the day of the encounter (includes time in activities that require the physician or other qualified health care professional and does not include time in activities normally performed by clinical staff). Physician's time may include  the following activities when performed: Preparing to see the patient (e.g., pre-charting review of records, searching for previously ordered imaging, lab work, and nerve conduction tests) Review of prior analgesic pharmacotherapies. Reviewing PMP Interpreting ordered tests (e.g., lab work, imaging, nerve conduction tests) Performing post-procedure evaluations, including interpretation of diagnostic procedures Obtaining and/or reviewing separately obtained history Performing a medically appropriate examination and/or evaluation Counseling and educating the patient/family/caregiver Ordering medications, tests, or procedures Referring and communicating with other health care professionals (when not separately reported) Documenting clinical information in the electronic or other health record Independently interpreting results (not separately reported) and communicating results to the patient/ family/caregiver Care coordination (not separately reported)  Note by: Candi Chafe, MD (TTS and AI technology used. I apologize for any typographical errors that were not detected and corrected.) Date: 01/18/2024; Time: 7:46 AM

## 2024-01-18 ENCOUNTER — Encounter: Payer: Self-pay | Admitting: Pain Medicine

## 2024-01-18 ENCOUNTER — Ambulatory Visit: Attending: Pain Medicine | Admitting: Pain Medicine

## 2024-01-18 ENCOUNTER — Ambulatory Visit: Admitting: Occupational Therapy

## 2024-01-18 VITALS — BP 109/59 | HR 82 | Temp 98.8°F | Resp 18 | Ht 63.0 in | Wt 337.0 lb

## 2024-01-18 DIAGNOSIS — M542 Cervicalgia: Secondary | ICD-10-CM | POA: Diagnosis not present

## 2024-01-18 NOTE — Patient Instructions (Signed)
   ______________________________________________________________________    Procedure instructions  Stop blood-thinners  Do not eat or drink fluids (other than water) for 6 hours before your procedure  No water for 2 hours before your procedure  Take your blood pressure medicine with a sip of water  Arrive 30 minutes before your appointment  If sedation is planned, bring suitable driver. Pennie Banter, Benedetto Goad, & public transportation are NOT APPROVED)  Carefully read the "Preparing for your procedure" detailed instructions  If you have questions call us at [phone]  Procedure appointments are for procedures only. NO medication refills or new problem evaluations.   ______________________________________________________________________

## 2024-01-18 NOTE — Progress Notes (Signed)
 Safety precautions to be maintained throughout the outpatient stay will include: orient to surroundings, keep bed in low position, maintain call bell within reach at all times, provide assistance with transfer out of bed and ambulation.

## 2024-01-19 ENCOUNTER — Other Ambulatory Visit

## 2024-01-19 ENCOUNTER — Telehealth (INDEPENDENT_AMBULATORY_CARE_PROVIDER_SITE_OTHER): Payer: Self-pay

## 2024-01-19 NOTE — Telephone Encounter (Signed)
 Unfortunately we do not have 3rd party funds available.  I would reach out to [employer] because sometimes they can lend the pump for a time frame

## 2024-01-19 NOTE — Telephone Encounter (Signed)
Made pt aware and she states understanding

## 2024-01-23 ENCOUNTER — Ambulatory Visit: Admitting: Occupational Therapy

## 2024-01-24 DIAGNOSIS — F4312 Post-traumatic stress disorder, chronic: Secondary | ICD-10-CM | POA: Diagnosis not present

## 2024-01-24 DIAGNOSIS — F331 Major depressive disorder, recurrent, moderate: Secondary | ICD-10-CM | POA: Diagnosis not present

## 2024-01-24 DIAGNOSIS — F39 Unspecified mood [affective] disorder: Secondary | ICD-10-CM | POA: Diagnosis not present

## 2024-01-24 DIAGNOSIS — F419 Anxiety disorder, unspecified: Secondary | ICD-10-CM | POA: Diagnosis not present

## 2024-01-25 ENCOUNTER — Encounter: Admitting: Occupational Therapy

## 2024-01-30 ENCOUNTER — Ambulatory Visit: Admitting: Occupational Therapy

## 2024-01-30 ENCOUNTER — Other Ambulatory Visit

## 2024-01-30 ENCOUNTER — Ambulatory Visit
Admission: RE | Admit: 2024-01-30 | Discharge: 2024-01-30 | Disposition: A | Discharge status: Home / Self Care | Source: Ambulatory Visit | Attending: Orthopedic Surgery | Admitting: Orthopedic Surgery

## 2024-01-30 DIAGNOSIS — E1169 Type 2 diabetes mellitus with other specified complication: Secondary | ICD-10-CM | POA: Diagnosis not present

## 2024-01-30 DIAGNOSIS — I890 Lymphedema, not elsewhere classified: Secondary | ICD-10-CM | POA: Diagnosis not present

## 2024-01-30 DIAGNOSIS — M67814 Other specified disorders of tendon, left shoulder: Secondary | ICD-10-CM | POA: Diagnosis not present

## 2024-01-30 DIAGNOSIS — M19012 Primary osteoarthritis, left shoulder: Secondary | ICD-10-CM

## 2024-01-30 DIAGNOSIS — Z79899 Other long term (current) drug therapy: Secondary | ICD-10-CM | POA: Diagnosis not present

## 2024-01-30 DIAGNOSIS — I152 Hypertension secondary to endocrine disorders: Secondary | ICD-10-CM | POA: Diagnosis not present

## 2024-01-30 DIAGNOSIS — Z6841 Body Mass Index (BMI) 40.0 and over, adult: Secondary | ICD-10-CM | POA: Diagnosis not present

## 2024-01-30 DIAGNOSIS — E890 Postprocedural hypothyroidism: Secondary | ICD-10-CM | POA: Diagnosis not present

## 2024-01-30 DIAGNOSIS — E785 Hyperlipidemia, unspecified: Secondary | ICD-10-CM | POA: Diagnosis not present

## 2024-01-30 DIAGNOSIS — Z1231 Encounter for screening mammogram for malignant neoplasm of breast: Secondary | ICD-10-CM | POA: Diagnosis not present

## 2024-01-30 DIAGNOSIS — E1159 Type 2 diabetes mellitus with other circulatory complications: Secondary | ICD-10-CM | POA: Diagnosis not present

## 2024-01-31 ENCOUNTER — Other Ambulatory Visit: Payer: Self-pay | Admitting: Internal Medicine

## 2024-01-31 ENCOUNTER — Encounter (INDEPENDENT_AMBULATORY_CARE_PROVIDER_SITE_OTHER): Payer: Self-pay

## 2024-01-31 ENCOUNTER — Encounter: Payer: Self-pay | Admitting: Occupational Therapy

## 2024-01-31 ENCOUNTER — Other Ambulatory Visit

## 2024-01-31 DIAGNOSIS — Z1231 Encounter for screening mammogram for malignant neoplasm of breast: Secondary | ICD-10-CM

## 2024-01-31 NOTE — Therapy (Signed)
 OUTPATIENT OCCUPATIONAL THERAPY  TREATMENT  BILATERAL LOWER EXTREMITY LYMPHEDEMA   Patient Name: Karen Lewis MRN: [id number] DOB:[birth year], 53 y.o., female Today's Date: 01/31/2024  END OF SESSION:   OT End of Session - 01/30/24 1309     Visit Number 4    Number of Visits 12    Date for OT Re-Evaluation 03/22/24    OT Start Time 0100    OT Stop Time 0200    OT Time Calculation (min) 60 min    Activity Tolerance Patient tolerated treatment well;No increased pain    Behavior During Therapy WFL for tasks assessed/performed                     Past Medical History:  Diagnosis Date   Anemia    Anginal pain (HCC)    Anxiety    Arthralgia of hip 07/29/2015   Arthritis    Arthritis, degenerative 07/29/2015   Asthma    Cephalalgia 07/25/2014   Dependence on unknown drug (HCC)    multiplt controlled drug dependence   Depression    Diabetes mellitus without complication (HCC)    Difficult intubation    per pt needs small tube (has had abrasions from tube in past)   Dysrhythmia    PVC's   Eczema    Fibromyalgia    Gastritis    GERD (gastroesophageal reflux disease)    Gonalgia 07/29/2015   Overview:  Overview:  The patient has had bilateral intra-articular Hyalgan injections done on 07/16/2014 and although she seems to do well with this type of therapy, apparently her insurance company does not want to pay for they Hyalgan. On 11/27/2014 the patient underwent a bilateral genicular nerve block with excellent results. On 01/28/2015 she had a right knee genicular radiofrequency ablatio   Gout    H/O cardiovascular disorder 03/10/2015   H/O surgical procedure 12/05/2012   Overview:  LSG ([facility] - April 2013)     H/O thyroid  disease 03/10/2015   Headache    Herpes    History of artificial joint 07/29/2015   History of hiatal hernia    Hypertension    Hypomagnesemia    Hypothyroidism    IDA (iron  deficiency anemia) 05/28/2019   LBP (low back pain) 07/29/2015    Neuromuscular disorder (HCC)    Obesity    PCOS (polycystic ovarian syndrome)    Primary osteoarthritis of both knees 07/29/2015   Sleep apnea    not using a Cpap machine at this time - most recent test mild apnea does not qualify for test (not OSA)   Thyroid  nodule bilateral   Umbilical hernia    Past Surgical History:  Procedure Laterality Date   APPENDECTOMY     CARPAL TUNNEL RELEASE Bilateral    CHOLECYSTECTOMY     COLONOSCOPY WITH PROPOFOL  N/A 02/11/2021   Procedure: COLONOSCOPY WITH PROPOFOL ;  Surgeon: Toledo, Alphonsus Jeans, MD;  Location: [facility];  Service: Gastroenterology;  Laterality: N/A;   DIAGNOSTIC LAPAROSCOPY     ESOPHAGOGASTRODUODENOSCOPY (EGD) WITH PROPOFOL  N/A 02/11/2021   Procedure: ESOPHAGOGASTRODUODENOSCOPY (EGD) WITH PROPOFOL ;  Surgeon: Toledo, Alphonsus Jeans, MD;  Location: [facility];  Service: Gastroenterology;  Laterality: N/A;   HIATAL HERNIA REPAIR     JOINT REPLACEMENT Bilateral hip   LAPAROSCOPIC PARTIAL GASTRECTOMY     left trigger finger     peniculectomy N/A 07/05/2018   ROUX-EN-Y GASTRIC BYPASS  06/03/2017   SHOULDER ARTHROSCOPY Right    THYROIDECTOMY N/A 11/12/2015   Procedure: THYROIDECTOMY;  Surgeon:  Von Grumbling, MD;  Location: [facility];  Service: ENT;  Laterality: N/A;   TOTAL HIP ARTHROPLASTY Right 11/27/2019   Procedure: TOTAL HIP ARTHROPLASTY ANTERIOR APPROACH;  Surgeon: Molli Angelucci, MD;  Location: [facility];  Service: Orthopedics;  Laterality: Right;   TRIGGER FINGER RELEASE Right    TRIGGER FINGER RELEASE Right 04/24/2020   Procedure: Right ring and middle trigger release;  Surgeon: Molli Angelucci, MD;  Location: [facility];  Service: Orthopedics;  Laterality: Right;   Patient Active Problem List   Diagnosis Date Noted   Cervical radiculopathy at C6 12/26/2023   Cervicalgia 12/26/2023   DDD (degenerative disc disease), cervical 12/26/2023   Skin laxity 11/30/2023   DDD (degenerative disc disease), lumbosacral 09/11/2023   Lumbar facet joint  pain 07/07/2023   AKI (acute kidney injury) (HCC) 08/08/2022   Pre-syncope 08/07/2022   Trigger point of right shoulder region 05/24/2022   Lumbar interspinous bursitis (L2-L5) 04/29/2022   Spinal enthesopathy of lumbar region (HCC) 04/29/2022   Lumbar facet arthropathy (Multilevel) (Bilateral) 04/29/2022   Lumbar central spinal stenosis, w/o claudication (L2-3, L4-5, L5-S1) 04/29/2022   Lumbar foraminal stenosis (Bilateral: L2-3, L4-5) (L>R) 04/29/2022   Lumbosacral lateral recess stenosis (Bilateral: L4-5, L5-S1) 04/29/2022   Lumbosacral radiculopathy at L2 (Right) 04/13/2022   Abnormal MRI, lumbar spine (07/01/2022) 04/13/2022   Chronic thigh pain (Right) 04/13/2022   Weakness of leg (Right) 04/13/2022   Tricompartment osteoarthritis of knees (Bilateral) 03/25/2022   Difficulty sleeping 12/07/2021   Lumbosacral radiculopathy at S1 (Left) 10/26/2021   Osteoarthritis of lumbar spine without myelopathy or radiculopathy 06/03/2021   Chronic use of opiate for therapeutic purpose 01/21/2021   Colon cancer screening 12/04/2020   Barrett's esophagus without dysplasia 12/04/2020   Uncomplicated opioid dependence (HCC) 09/25/2020   Pain and numbness of left upper extremity 03/13/2020   Cervical radiculitis (Left) 03/13/2020   Pharmacologic therapy 02/19/2020   S/P THR (total hip replacement) (Right) (11/27/2019) 02/19/2020   Aftercare following joint replacement surgery 12/23/2019   History of total hip replacement (Bilateral) 11/27/2019   Bilateral primary osteoarthritis of knee 09/14/2019   Presence of artificial hip joint, bilateral 09/14/2019   Body mass index (BMI) 70 or greater, adult (HCC) 09/14/2019   Morbid (severe) obesity due to excess calories (HCC) 09/14/2019   Essential (primary) hypertension 09/14/2019   Lymphedema 08/20/2019   Type II diabetes mellitus with complication (HCC) 08/16/2019   Atypical chest pain 08/13/2019   Heart palpitations 08/13/2019   Leg swelling  07/15/2019   Elevated LFTs 06/07/2019   Edema of both legs 06/04/2019   Hypocalcemia 06/04/2019   Adult failure to thrive syndrome 06/03/2019   Iron  deficiency anemia, unspecified 05/28/2019   Mycobacterium abscessus infection 02/22/2019   Surgical wound, non healing 09/27/2018   Wound infection after surgery 08/11/2018   Panniculitis 07/05/2018   Allergy to iodine 04/26/2018   H/O allergy to shellfish 04/26/2018   Encounter for dental examination 02/21/2018   Spondylosis without myelopathy or radiculopathy, lumbosacral region 02/21/2018   History of gastric bypass 02/15/2018   Depression 01/19/2018   Secondary Osteoarthritis of knee (Bilateral) (R>L) 01/02/2018   Encounter for dental exam and cleaning w/o abnormal findings 12/19/2017   Post op infection 12/05/2017   Osteoarthritis of hip (Right) 07/07/2017   Osteoarthritis of shoulder (Left) 05/31/2017   Upper extremity pain 04/21/2017   Chronic shoulder pain (Left) 04/21/2017   Chronic pain syndrome 12/02/2016   Neurogenic pain 12/02/2016   Chest pain with low risk of acute coronary syndrome 08/04/2016  SOB (shortness of breath) on exertion 08/04/2016   Dysphagia, unspecified 05/04/2016   Gastro-esophageal reflux disease without esophagitis 05/04/2016   GERD (gastroesophageal reflux disease) 05/04/2016   Vitamin D  insufficiency 11/27/2015   Nontoxic goiter 11/12/2015   Other psychoactive substance dependence, uncomplicated (HCC) 10/13/2015   Controlled drug dependence (HCC) 10/13/2015   Lumbar spondylosis 09/18/2015   Hypomagnesemia 08/27/2015   Class 3 severe obesity due to excess calories with serious comorbidity and body mass index (BMI) of 60.0 to 69.9 in adult 08/27/2015   Long term current use of opiate analgesic 07/29/2015   Long term prescription opiate use 07/29/2015   Opiate use 07/29/2015   Encounter for therapeutic drug level monitoring 07/29/2015   Opiate dependence (HCC) 07/29/2015   Chronic knee pain (1ry  area of Pain) (Bilateral) (R>L) 07/29/2015   Chronic low back pain (3ry area of Pain) (Bilateral) (R>L) w/o sciatica 07/29/2015   Lumbar facet syndrome (Bilateral) (R>L) 07/29/2015   Secondary osteoarthritis of multiple sites 07/29/2015   Grade 1  Anterolisthesis of L3/L4 & L4/L5 (1.4 cm) 07/29/2015   Chronic hip pain s/p THR (2ry area of pain) (Bilateral) 07/29/2015   S/P THR (total hip replacement) (Left) 07/29/2015   History of methicillin resistant staphylococcus aureus (MRSA) 07/29/2015   History of bariatric surgery 07/29/2015   History of methicillin resistant Staphylococcus aureus infection 07/29/2015   Eczema 05/21/2015   Headache, migraine 05/21/2015   Bilateral polycystic ovarian syndrome 05/21/2015   Disease of thyroid  gland 05/21/2015   Major depressive disorder, single episode, unspecified 05/21/2015   Dermatitis 05/21/2015   Obstructive apnea 04/30/2015   History of asthma 03/10/2015   Binge eating disorder 03/10/2015   Fibromyalgia 03/10/2015   Anxiety, generalized 03/10/2015   Insomnia, persistent 03/10/2015   Depression, major, recurrent, moderate (HCC) 03/10/2015   Avitaminosis D 04/30/2014   Hyperlipidemia, unspecified 04/25/2014   Hyperlipidemia due to type 2 diabetes mellitus (HCC) 04/25/2014   Uncomplicated asthma 03/19/2014   Asthma 03/19/2014   Goiter, nontoxic, multinodular 10/09/2013   Apnea, sleep 12/05/2012   History of surgical procedure 12/05/2012   Type 2 diabetes mellitus without complication, without long-term current use of insulin  (HCC) 12/05/2012   Hypertension associated with diabetes (HCC) 12/05/2012   Other specified postprocedural states 12/05/2012    PCP: Yehuda Helms, MD  REFERRING PROVIDER: Sharla Davis, NP  REFERRING DIAG: I89.0  THERAPY DIAG:  Lymphedema, not elsewhere classified  Rationale for Evaluation and Treatment: Rehabilitation  ONSET DATE: "years ago"  SUBJECTIVE:  SUBJECTIVE STATEMENT: Ms Bartolini presents to OT for  Rx visit addressing BLE lymphedema. Pt arrives riding a motorized scooter. Pt has no new complaints since initial evaluation. 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   (INITIAL OT EVAL : MAICIE VANDERLOOP is referred to Occupational Therapy by Sharla Davis, NP for evaluation and treatment of BLE lymphedema. This Pt is known to this OT as I evaluated her on 09/27/19 for the same referring provider. Pt states she wants to know if there's anything new that could assist w her leg swelling. Pt reports chronic, progressive leg swelling for years.Pt endorses positive leg swelling in her father. Pt has not undergone lymphedema treatment in the past and she does not wear compression stockings. She underwent Roux-en-y gastric bypass in 2018 and paniculectomy in 2019.  Pt is awaiting delivery of a pneumatic compression device for her legs - brand and type unknown. Pt reports she is taking Ozempic and Topamax  and trying to exercise more to lose more weight. She states she is fairly satisfied with her current weight. Pt's goals for OT are to learn about any new treatment options for lymphedema and to control and limit progression of LE swelling.  PERTINENT HISTORY: Relevant to Lymphedema Fibromyalgia Long term current use of opiate analgesic Long term prescription opiate use Opiate use Opiate dependence (HCC) Chronic knee pain (1ry area of Pain) (Bilateral) (R>L) Chronic low back pain (3ry area of Pain) (Bilateral) (R>L) w/o sciatica Lumbar facet syndrome (Bilateral) (R>L) Secondary osteoarthritis of multiple sites Grade 1  Anterolisthesis of L3/L4 & L4/L5 (1.4 cm) Chronic hip pain s/p THR (2ry area of pain) (Bilateral) Class 3 severe obesity due to  excess calories with serious comorbidity and body mass index (BMI) of 60.0 to 69.9 in adult [facility]) Lumbar spondylosis Chronic pain syndrome Neurogenic pain Chronic shoulder pain (Left) Osteoarthritis of shoulder (Left) Osteoarthritis of hip (Right) Secondary Osteoarthritis of knee (Bilateral) (R>L) Spondylosis without myelopathy or radiculopathy, lumbosacral region History of gastric bypass Pharmacologic therapy Bilateral primary osteoarthritis of knee Osteoarthritis of lumbar spine without myelopathy or radiculopathy Lumbosacral radiculopathy at S1 (Left) Tricompartment osteoarthritis of knees (Bilateral) Lumbosacral radiculopathy at L2 (Right) Chronic thigh pain (Right) Weakness of leg (Right) Lumbar interspinous bursitis (L2-L5) Spinal enthesopathy of lumbar region [facility]) Lumbar facet arthropathy (Multilevel) (Bilateral) Lumbar central spinal stenosis, w/o claudication (L2-3, L4-5, L5-S1) Lumbar foraminal stenosis (Bilateral: L2-3, L4-5) (L>R) Lumbosacral lateral recess stenosis (Bilateral: L4-5, L5-S1) Lumbar facet joint pain DDD (degenerative disc disease), lumbosacral Cervical radiculopathy at C6 Cervicalgia DDD (degenerative disc disease), cervical History of asthma Binge eating disorder Anxiety, generalized Insomnia, persistent Depression, major, recurrent, moderate (HCC) Uncomplicated asthma Eczema Obstructive apnea Bilateral polycystic ovarian syndrome Apnea, sleep Disease of thyroid  gland Type 2 diabetes mellitus without complication, without long-term current use of insulin  (HCC) Hypertension associated with diabetes (HCC) S/P THR (total hip replacement) (Left) History of methicillin resistant staphylococcus aureus (MRSA) History of bariatric surgery History of methicillin resistant Staphylococcus aureus infection Chest pain with low risk of acute coronary syndrome Other psychoactive substance dependence, uncomplicated (HCC) SOB (shortness of breath) on  exertion Upper extremity pain Controlled drug dependence (HCC) Hyperlipidemia due to type 2 diabetes mellitus (HCC) Post op infection Major depressive disorder, single episode, unspecified Depression Dermatitis Asthma Panniculitis Surgical wound, non healing Mycobacterium abscessus infection Wound infection after surgery Iron  deficiency anemia, unspecified Leg swelling Adult failure to thrive syndrome Edema of both legs Elevated LFTs Hypocalcemia Atypical chest pain Heart  palpitations Type II diabetes mellitus with complication (HCC) Lymphedema History of total hip replacement (Bilateral) S/P THR (total hip replacement) (Right) (11/27/2019) Pain and numbness of left upper extremity Cervical radiculitis (Left) Uncomplicated opioid dependence (HCC) Aftercare following joint replacement surgery Barrett's esophagus without dysplasia GERD (gastroesophageal reflux disease) Chronic use of opiate for therapeutic purpose Difficulty sleeping Abnormal MRI, lumbar spine (07/01/2022) Pre-syncope AKI (acute kidney injury) (HCC) Presence of artificial hip joint, bilateral Body mass index (BMI) 70 or greater, adult (HCC) Morbid (severe) obesity due to excess calories (HCC) Essential (primary) hypertension    PAIN:  Are you having pain? Yes: NPRS scale: not rated numerically Pain location: B LE Pain description: heavy, sore Aggravating factors: standing, walking Relieving factors: moving, leg elevation  PRECAUTIONS: Other: LYMPHEDEMA PRECAUTIONS: cardiac, pulmonary, thyroid   RED FLAGS: Hx cellulitis, non-healing wounds, BMI 59.70 k/m2   WEIGHT BEARING RESTRICTIONS: No  FALLS:  Has patient fallen in last 6 months? No  LIVING ENVIRONMENT: Lives with: lives with their partner Lives in: House/apartment   OCCUPATION: disability, unemployed  LEISURE: TBA  HAND DOMINANCE: right   PRIOR LEVEL OF FUNCTION: Independent with household mobility with device, Independent with  community mobility with device, Requires assistive device for independence, Needs assistance with ADLs, Needs assistance with homemaking, and Needs assistance with gait  PATIENT GOALS: to learn about any new treatment options for lymphedema and to control and limit progression of LE swelling.  OBJECTIVE: Note: Objective measures were completed at Evaluation unless otherwise noted.  COGNITION:  Overall cognitive status: Within functional limits for tasks assessed   OBSERVATIONS / OTHER ASSESSMENTS: Severe BLE lipo-lymphedema 2/2 Lipedema and severe stage II, obesity-induced lymphedema  LE ROM: limited at hips, knees and ankles 2/2 body habitus and skin approximation  LE MMT: WFL  LYMPHEDEMA ASSESSMENTS:  SURGERY TYPE/DATE: Non-cancer related, N/A  INFECTIONS: + hx of cellulitis  GAIT: Distance walked: riding scooter Assistive device utilized: None Level of assistance: Modified independence and    LYMPHEDEMA LIFE IMPACT SCALE (LLIS): NT  PSYCHOSOCIAL CONCERNS: Lymphedema affects my body image (i.e.. "How I think I look") Lymphedema "gets me down" (i.e.. I have feelings of depression, frustration, or anger due to the lymphedema)  TREATMENT THIS DATE:  RLE/RLQ MLD Pt edu re LE self-care home program   PATIENT EDUCATION:  Continued Pt/ CG edu for lymphedema self care home program throughout session. Topics include outcome of comparative limb volumetrics- starting limb volume differentials (LVDs), technology and gradient techniques used for short stretch, multilayer compression wrapping, simple self-MLD, therapeutic lymphatic pumping exercises, skin/nail care, LE precautions, compression garment recommendations and specifications, wear and care schedule and compression garment donning / doffing w assistive devices. Discussed progress towards all OT goals since commencing CDT. Discussed detrimental impact of obesity on lower and upper extremity lymphedema over time. Reviewed OT goals  for lymphedema care with Pt and discussed progress to date.  All questions answered to the Pt's satisfaction. Good return. Person educated: Patient  Education method: Explanation, Demonstration, and Handouts Education comprehension: verbalized understanding, returned demonstration, verbal cues required, and needs further education  LYMPHEDEMA self-care HOME PROGRAM: Lymphedema Self-Care Home Program: Simple self-MLD1/daily to affected quadrant and body part At least 2 x daily BLE Lymphatic Pumping There ex 1 set of 10 reps each, in order, bilaterally Daily skin care to affected body part to limit infection risk and increase skin excursion Compression Intensive stage compression: multilayer short stretch wraps with gradient techniques. One limb at a time. Length patient dependent. Self-management Phase: Appropriate daytime  compression garment and hours-of-sleep device  Custom-made gradient compression garments and hours-of-sleep devices are medically necessary because they are uniquely sized and shaped to fit the exact dimensions of the affected extremities and to provide accurate and consistent gradient compression and containment, essential  for optimally managing chronic, progressive lymphedema. The convoluted HOS devices are medically necessary to facilitate increased lymphatic circulation and limit fibrosis formation when sleeping. Multiple custom compression garments are needed for optimal hygiene to limit infection risk. Custom compression garments should be replaced q 3-6 months When worn consistently for optimal lymphedema self-management over time.  ASSESSMENT:  CLINICAL IMPRESSION: Pt tolerated MLD to RLE/RLQ without increased pain utilizing modified short neck sequence ( thyroid  precautions),  deep abdominal lymphatics, and functional lymphatics. Fat nodules associated with lipo-lymphedema are palpable, but not painful to light touch. Cont MLD 1 x weekly until garment fitting is  complete. Cont as per POC.  (OT INITIAL EVAL: DESTA BUJAK is a 54 yo female presenting with severe BLE lipo-lymphedema 2/2 Lipedema, and severe stage II, obesity-induced lymphedema (OIL). Of bilateral lower extremities and lower quadrants. These combined and complex diagnoses are  difficult to treat since the lipedema aspect does not typically respond to gold standard lymphedema treatment, Complete Decongestive Therapy (CDT), and the lymphedema aspect may respond to CDT, but only marginally. Complete Decongestive Therapy (CDT) consists of 2 phases. CDT includes daily manual lymphatic drainage (MLD), skin care and therapeutic  lymphatic pumping exercises. During the Intensive Phase of CDT, multilayer, gradient compression bandages are applied daily to one leg at a time to reduce limb volume. Once limb volume reduction reaches a clinical plateau Pt is fitted with appropriate compression garments and Self-Management Phase of CDT begins.   LIPO-LYMPHEDEMA: First described in the 1940's, lipedema is a chronic condition that presents as symmetrical accumulation of fat in the subcutaneous tissue with almost exclusive occurrence in women. Fat tissue expands primarily in the lower limbs, from buttocks to ankles, as well as in the arms, with weight loss strategies exhibiting limited effects on limb fat. Lipedema is further characterized by pain, tenderness upon contact, easy bruising, swelling (edema), and psychosocial issues that all impact a patient's overall quality of life. Onset of lipedema is reported to occur during puberty. However, it can occur or be exacerbated by periods of hormonal shifts, such as pregnancy or menopause, although data as to why are limited.  Weight gain and obesity are also considered to play a role in lipedema onset and exacerbation of symptoms.  The fundamental treatment for OIL is weight loss. Resection of areas of massive weight gain and localized lymphatic lobules may be considered, and  lower extremity volume reductions are performed when the BMI is lowered to <40 to reduce complications and recurrence. In this case, history of DM 2 and non-healing wounds may preclude this action. To be successful with CDT and lymphedema self-management to limit progression over time, this Pt will benefit from massive weight loss. Once weight loss is achieved Pt may not still need daily caregiver assistance to apply multilayer, gradient compression wraps, and later, custom compression garments, or alternative devices. The burden of care is high with CDT in both phases when Pt is unable to reach feet and distal legs to wrap and / or don /doff compression garments.  At present [employer] prognosis is not a good candidate or CDT due to her BMI of nearly 60 (of 59.7 kg/m2) . Pt will benefit from ongoing weight loss and daily use of an  advanced sequential compression device to facilitate improved lymphatic decongestion and reduced pain/ discomfort in her legs and lower body. Pt tells me that this is in the works  with Sharla Davis and [employer]. OT emailed the manufacturer's rep to follow up. Pt will also benefit from compression garments to assist with lymphedema management and to reduce pain.Pt should be fitted with:  BLE, custom , Jobst , flat knit , ELVAREX CLASSIC, ccl 2 ( 23-32 mmHg) knee high compression stockings to be worn under Sigvaris 10-15 mmHg  Capri length, compression shorts for full time daily use.  Custom-made gradient compression garments are medically necessary because they are uniquely sized and shaped to fit the exact dimensions of the affected extremities, and to provide appropriate medical grade, graduated compression essential for optimally management of chronic, progressive lymphedema. Multiple custom compression garments are needed to ensure proper hygiene to limit infection risk. Custom compression garments should be replaced q 3-6 months When worn consistently for optimal  lipo-lymphedema self-management over time. HOS devices, medically necessary to limit fibrosis buildup in tissue, should be replaced q 2 years and PRN when worn out.       OBJECTIVE IMPAIRMENTS: Abnormal gait, decreased activity tolerance, decreased balance, decreased endurance, decreased knowledge of condition, decreased knowledge of use of DME, decreased mobility, difficulty walking, decreased ROM, decreased strength, increased edema, impaired flexibility, impaired sensation, impaired UE functional use, postural dysfunction, obesity, and pain.   ACTIVITY LIMITATIONS: Basic and Instrumental ADLs (meal prep, cleaning, laundry, driving, shopping, community activity, occupation, yard work, school, church ), Functional ambulation and mobility (carrying, lifting, bending, sitting, standing, squatting, stairs, transfers, and bed mobility),   PARTICIPATION LIMITATIONS: Participation in community activities, decreased social participation  PERSONAL FACTORS: Age, Behavior pattern, Fitness, Past/current experiences, Time since onset of injury/illness/exacerbation, and 3+ comorbidities: complex constellation of chronic  are also affecting patient's functional outcome.   REHAB POTENTIAL: Poor    EVALUATION COMPLEXITY: High   GOALS: Goals reviewed with patient? Yes  SHORT TERM GOALS: Target date: 12/22/23  Pt presents with limited knowledge of impact of body weight and BMI on lymphedema exacerbation, progression, and functional performance. Pt will understand impact of body fat accumulation and distribution 2/2 Lipedema and Lipo-Lymphedema on lymphatic function to improve compliance with lymphedema self management over time. Baseline: Dependent Goal status: INITIAL; ONGOING  PLAN:  OT FREQUENCY: N/Apt discharged this date  OT DURATION: other: N/A Pt discharged from OT for CDT this date  PLANNED INTERVENTIONS: 97535- Self Care and Patient/Family education  PLAN FOR NEXT SESSION: N/A Pt discharged  from OT for CDT this date   Arnold Bicker, MS, OTR/L, CLT-LANA 01/31/24 1:33 PM

## 2024-02-01 ENCOUNTER — Encounter: Admitting: Occupational Therapy

## 2024-02-07 ENCOUNTER — Encounter: Payer: Self-pay | Admitting: Pain Medicine

## 2024-02-07 ENCOUNTER — Ambulatory Visit
Admission: RE | Admit: 2024-02-07 | Discharge: 2024-02-07 | Disposition: A | Discharge status: Home / Self Care | Source: Ambulatory Visit | Attending: Pain Medicine | Admitting: Pain Medicine

## 2024-02-07 ENCOUNTER — Ambulatory Visit: Attending: Nurse Practitioner | Admitting: Pain Medicine

## 2024-02-07 VITALS — BP 100/50 | HR 80 | Temp 97.1°F | Resp 17 | Ht 63.0 in | Wt 333.0 lb

## 2024-02-07 DIAGNOSIS — M5030 Other cervical disc degeneration, unspecified cervical region: Secondary | ICD-10-CM | POA: Diagnosis not present

## 2024-02-07 DIAGNOSIS — M79602 Pain in left arm: Secondary | ICD-10-CM | POA: Insufficient documentation

## 2024-02-07 DIAGNOSIS — M79601 Pain in right arm: Secondary | ICD-10-CM | POA: Insufficient documentation

## 2024-02-07 DIAGNOSIS — M5412 Radiculopathy, cervical region: Secondary | ICD-10-CM | POA: Insufficient documentation

## 2024-02-07 DIAGNOSIS — R937 Abnormal findings on diagnostic imaging of other parts of musculoskeletal system: Secondary | ICD-10-CM | POA: Insufficient documentation

## 2024-02-07 DIAGNOSIS — G8929 Other chronic pain: Secondary | ICD-10-CM | POA: Insufficient documentation

## 2024-02-07 DIAGNOSIS — M542 Cervicalgia: Secondary | ICD-10-CM | POA: Insufficient documentation

## 2024-02-07 DIAGNOSIS — E66813 Obesity, class 3: Secondary | ICD-10-CM | POA: Diagnosis not present

## 2024-02-07 DIAGNOSIS — M25512 Pain in left shoulder: Secondary | ICD-10-CM | POA: Diagnosis not present

## 2024-02-07 DIAGNOSIS — Z888 Allergy status to other drugs, medicaments and biological substances status: Secondary | ICD-10-CM | POA: Insufficient documentation

## 2024-02-07 DIAGNOSIS — Z6841 Body Mass Index (BMI) 40.0 and over, adult: Secondary | ICD-10-CM | POA: Insufficient documentation

## 2024-02-07 MED ORDER — SODIUM CHLORIDE (PF) 0.9 % IJ SOLN
INTRAMUSCULAR | Status: AC
Start: 2024-02-07 — End: ?
  Filled 2024-02-07: qty 10

## 2024-02-07 MED ORDER — MIDAZOLAM HCL 5 MG/5ML IJ SOLN
0.5000 mg | Freq: Once | INTRAMUSCULAR | Status: AC
  Administered 2024-02-07: 2 mg via INTRAVENOUS

## 2024-02-07 MED ORDER — ROPIVACAINE HCL 2 MG/ML IJ SOLN: 1.0000 mL | Freq: Once | INTRAMUSCULAR | Status: DC

## 2024-02-07 MED ORDER — PENTAFLUOROPROP-TETRAFLUOROETH EX AERO
INHALATION_SPRAY | Freq: Once | CUTANEOUS | Status: AC
  Administered 2024-02-07: 30 via TOPICAL

## 2024-02-07 MED ORDER — ROPIVACAINE HCL 2 MG/ML IJ SOLN
INTRAMUSCULAR | Status: AC
  Filled 2024-02-07: qty 20

## 2024-02-07 MED ORDER — SODIUM CHLORIDE 0.9% FLUSH
1.0000 mL | Freq: Once | INTRAVENOUS | Status: AC
  Administered 2024-02-07: 1 mL

## 2024-02-07 MED ORDER — IOHEXOL 180 MG/ML  SOLN
10.0000 mL | Freq: Once | INTRAMUSCULAR | Status: AC
  Administered 2024-02-07: 10 mL via EPIDURAL

## 2024-02-07 MED ORDER — LIDOCAINE HCL 2 % IJ SOLN
INTRAMUSCULAR | Status: AC
  Filled 2024-02-07: qty 20

## 2024-02-07 MED ORDER — DEXAMETHASONE SODIUM PHOSPHATE 10 MG/ML IJ SOLN
10.0000 mg | Freq: Once | INTRAMUSCULAR | Status: AC
  Administered 2024-02-07: 10 mg

## 2024-02-07 MED ORDER — FENTANYL CITRATE (PF) 100 MCG/2ML IJ SOLN
INTRAMUSCULAR | Status: AC
  Filled 2024-02-07: qty 2

## 2024-02-07 MED ORDER — IOHEXOL 180 MG/ML  SOLN
INTRAMUSCULAR | Status: AC
  Filled 2024-02-07: qty 10

## 2024-02-07 MED ORDER — FENTANYL CITRATE (PF) 100 MCG/2ML IJ SOLN
25.0000 ug | INTRAMUSCULAR | Status: DC | PRN
  Administered 2024-02-07: 50 ug via INTRAVENOUS

## 2024-02-07 MED ORDER — DEXAMETHASONE SODIUM PHOSPHATE 10 MG/ML IJ SOLN
INTRAMUSCULAR | Status: AC
Start: 2024-02-07 — End: ?
  Filled 2024-02-07: qty 1

## 2024-02-07 MED ORDER — MIDAZOLAM HCL 5 MG/5ML IJ SOLN
INTRAMUSCULAR | Status: AC
Start: 2024-02-07 — End: ?
  Filled 2024-02-07: qty 5

## 2024-02-07 MED ORDER — LIDOCAINE HCL 2 % IJ SOLN
20.0000 mL | Freq: Once | INTRAMUSCULAR | Status: AC
  Administered 2024-02-07: 400 mg

## 2024-02-07 NOTE — Progress Notes (Signed)
 PROVIDER NOTE: Interpretation of information contained herein should be left to medically-trained personnel. Specific patient instructions are provided elsewhere under "Patient Instructions" section of medical record. This document was created in part using STT-dictation technology, any transcriptional errors that may result from this process are unintentional.  Patient: Karen Lewis Type: Established DOB: [birth year] MRN: [id number] PCP: Karen Helms, MD  Service: Procedure DOS: 02/07/2024 Setting: Ambulatory Location: Ambulatory outpatient facility Delivery: Face-to-face Provider: Candi Chafe, MD Specialty: Interventional Pain Management Specialty designation: 09 Location: Outpatient facility Ref. Prov.: Patel, Seema K, NP       Interventional Therapy   Procedure: Cervical Epidural Steroid injection (CESI) (Interlaminar) #1  Laterality: Left  Level: C7-T1 Imaging: Fluoroscopy-assisted DOS: 02/07/2024  Performed by: Karen Caroli, MD Anesthesia: Local anesthesia (1-2% Lidocaine ) Anxiolysis: IV Versed  2.0 mg Sedation: Moderate Sedation Fentanyl  1 mL (50 mcg)   Purpose: Diagnostic/Therapeutic Indications: Cervicalgia, cervical radicular pain, degenerative disc disease, severe enough to impact quality of life or function. 1. DDD (degenerative disc disease), cervical   2. Cervicalgia   3. Cervical radiculopathy at C6   4. Cervical radiculitis (Left)   5. Chronic shoulder pain (Left)   6. Pain in both upper extremities   7. Allergy to iodine   8. Class 3 severe obesity due to excess calories with serious comorbidity and body mass index (BMI) of 60.0 to 69.9 in adult    NAS-11 score:   Pre-procedure: 5 /10   Post-procedure: 0-No pain/10      Position  Prep  Materials:  Location setting: Procedure suite Position: Prone, on modified reverse trendelenburg to facilitate breathing, with head in head-cradle. Pillows positioned under chest (below chin-level) with  cervical spine flexed. Safety Precautions: Patient was assessed for positional comfort and pressure points before starting the procedure. Prepping solution: DuraPrep (Iodine Povacrylex [0.7% available iodine] and Isopropyl Alcohol, 74% w/w) Prep Area: Entire  cervicothoracic region Approach: percutaneous, paramedial Intended target: Posterior cervical epidural space Materials Procedure:  Tray: Epidural Needle(s): Epidural (Tuohy) Qty: 1 Length: (90mm) 3.5-inch Gauge: 17G  H&P (Pre-op Assessment):  Karen Lewis is a 53 y.o. (year old), female patient, seen today for interventional treatment. She  has a past surgical history that includes Laparoscopic partial gastrectomy; Shoulder arthroscopy (Right); Carpal tunnel release (Bilateral); Diagnostic laparoscopy; Cholecystectomy; Trigger finger release (Right); Thyroidectomy (N/A, 11/12/2015); left trigger finger; Roux-en-Y Gastric Bypass (06/03/2017); Hiatal hernia repair; peniculectomy (N/A, 07/05/2018); Total hip arthroplasty (Right, 11/27/2019); Joint replacement (Bilateral, hip); Appendectomy; Trigger finger release (Right, 04/24/2020); Colonoscopy with propofol  (N/A, 02/11/2021); and Esophagogastroduodenoscopy (egd) with propofol  (N/A, 02/11/2021). Karen Lewis has a current medication list which includes the following prescription(s): accu-chek aviva plus, acetaminophen , acidophilus, albuterol , alcohol prep, amitriptyline , biotin , butalbital -acetaminophen -caffeine , calcium  carbonate, clonazepam , diclofenac  sodium, diphenhydramine , duloxetine, dupilumab, epinephrine , ergocalciferol , famotidine , emgality, accu-chek aviva plus, levocetirizine, levothyroxine , lisinopril , magnesium  oxide, meclizine , metformin , metoprolol  tartrate, montelukast , tab-a-vite, naloxone , naphazoline-pheniramine, nitrofurantoin , nitrofurantoin  (macrocrystal-monohydrate), nystatin , omeprazole, oxycodone , [START ON 02/10/2024] oxycodone , [START ON 03/11/2024] oxycodone , ozempic (0.25 or 0.5  mg/dose), phentermine, pregabalin , quetiapine , rizatriptan, rizatriptan, rosuvastatin , tizanidine , topiramate , cyanocobalamin , and hydrochlorothiazide, and the following Facility-Administered Medications: fentanyl  and ropivacaine  (pf) 2 mg/ml (0.2%). Her primarily concern today is the Neck Pain (Mid to lower back; left shoulder and arm)  Initial Vital Signs:  Pulse/HCG Rate: 80ECG Heart Rate: 77 (nsr) Temp: (!) 97.1 F (36.2 C) Resp: 17 BP: 94/70 SpO2: 95 %  BMI: Estimated body mass index is 58.99 kg/m as calculated from the following:   Height as of this encounter: 5\' 3"  (1.6 m).  Weight as of this encounter: 333 lb (151 kg).  Risk Assessment: Allergies: Reviewed. She is allergic to bactrim [sulfamethoxazole-trimethoprim], omalizumab, ciprofloxacin, shellfish allergy, and nsaids.  Allergy Precautions: None required Coagulopathies: Reviewed. None identified.  Blood-thinner therapy: None at this time Active Infection(s): Reviewed. None identified. Karen Lewis is afebrile  Site Confirmation: Karen Lewis was asked to confirm the procedure and laterality before marking the site Procedure checklist: Completed Consent: Before the procedure and under the influence of no sedative(s), amnesic(s), or anxiolytics, the patient was informed of the treatment options, risks and possible complications. To fulfill our ethical and legal obligations, as recommended by the American Medical Association's Code of Ethics, I have informed the patient of my clinical impression; the nature and purpose of the treatment or procedure; the risks, benefits, and possible complications of the intervention; the alternatives, including doing nothing; the risk(s) and benefit(s) of the alternative treatment(s) or procedure(s); and the risk(s) and benefit(s) of doing nothing. The patient was provided information about the general risks and possible complications associated with the procedure. These may include, but are not limited  to: failure to achieve desired goals, infection, bleeding, organ or nerve damage, allergic reactions, paralysis, and death. In addition, the patient was informed of those risks and complications associated to Spine-related procedures, such as failure to decrease pain; infection (i.e.: Meningitis, epidural or intraspinal abscess); bleeding (i.e.: epidural hematoma, subarachnoid hemorrhage, or any other type of intraspinal or peri-dural bleeding); organ or nerve damage (i.e.: Any type of peripheral nerve, nerve root, or spinal cord injury) with subsequent damage to sensory, motor, and/or autonomic systems, resulting in permanent pain, numbness, and/or weakness of one or several areas of the body; allergic reactions; (i.e.: anaphylactic reaction); and/or death. Furthermore, the patient was informed of those risks and complications associated with the medications. These include, but are not limited to: allergic reactions (i.e.: anaphylactic or anaphylactoid reaction(s)); adrenal axis suppression; blood sugar elevation that in diabetics may result in ketoacidosis or comma; water retention that in patients with history of congestive heart failure may result in shortness of breath, pulmonary edema, and decompensation with resultant heart failure; weight gain; swelling or edema; medication-induced neural toxicity; particulate matter embolism and blood vessel occlusion with resultant organ, and/or nervous system infarction; and/or aseptic necrosis of one or more joints. Finally, the patient was informed that Medicine is not an exact science; therefore, there is also the possibility of unforeseen or unpredictable risks and/or possible complications that may result in a catastrophic outcome. The patient indicated having understood very clearly. We have given the patient no guarantees and we have made no promises. Enough time was given to the patient to ask questions, all of which were answered to the patient's satisfaction.  Ms. Siess has indicated that she wanted to continue with the procedure. Attestation: I, the ordering provider, attest that I have discussed with the patient the benefits, risks, side-effects, alternatives, likelihood of achieving goals, and potential problems during recovery for the procedure that I have provided informed consent. Date  Time: 02/07/2024 10:35 AM  Pre-Procedure Preparation:  Monitoring: As per clinic protocol. Respiration, ETCO2, SpO2, BP, heart rate and rhythm monitor placed and checked for adequate function Safety Precautions: Patient was assessed for positional comfort and pressure points before starting the procedure. Time-out: I initiated and conducted the "Time-out" before starting the procedure, as per protocol. The patient was asked to participate by confirming the accuracy of the "Time Out" information. Verification of the correct person, site, and procedure were performed and confirmed  by me, the nursing staff, and the patient. "Time-out" conducted as per Joint Commission's Universal Protocol (UP.01.01.01). Time: 1125 Start Time: 1125 hrs.  Description  Narrative of Procedure:          Rationale (medical necessity): procedure needed and proper for the diagnosis and/or treatment of the patient's medical symptoms and needs. Start Time: 1125 hrs. Safety Precautions: Aspiration looking for blood return was conducted prior to all injections. At no point did we inject any substances, as a needle was being advanced. No attempts were made at seeking any paresthesias. Safe injection practices and needle disposal techniques used. Medications properly checked for expiration dates. SDV (single dose vial) medications used. Description of procedure: Protocol guidelines were followed. The patient was assisted into a comfortable position. The target area was identified and the area prepped in the usual manner. Skin & deeper tissues infiltrated with local anesthetic. Appropriate amount of  time allowed to pass for local anesthetics to take effect. Using fluoroscopic guidance, the epidural needle was introduced through the skin, ipsilateral to the reported pain, and advanced to the target area. Posterior laminar os was contacted and the needle walked caudad, until the lamina was cleared. The ligamentum flavum was engaged and the epidural space identified using "loss-of-resistance technique" with 2-3 ml of PF-NaCl (0.9% NSS), in a 5cc dedicated LOR syringe. (See "Imaging guidance" below for use of contrast details.) Once proper needle placement was secured, and negative aspiration confirmed, the solution was injected in intermittent fashion, asking for systemic symptoms every 0.5cc. The needles were then removed and the area cleansed, making sure to leave some of the prepping solution back to take advantage of its long term bactericidal properties.  Vitals:   02/07/24 1130 02/07/24 1140 02/07/24 1150 02/07/24 1200  BP: 114/79 96/60 100/66 (!) 100/50  Pulse:      Resp: 18 18 17 17   Temp:      SpO2: 99% 97% 96% 97%  Weight:      Height:         End Time: 1130 hrs.  Imaging Guidance (Spinal):          Type of Imaging Technique: Fluoroscopy Guidance (Spinal) Indication(s): Fluoroscopy guidance for needle placement to enhance accuracy in procedures requiring precise needle localization for targeted delivery of medication in or near specific anatomical locations not easily accessible without such real-time imaging assistance. Exposure Time: Please see nurses notes. Contrast: Before injecting any contrast, we confirmed that the patient did not have an allergy to iodine, shellfish, or radiological contrast. Once satisfactory needle placement was completed at the desired level, radiological contrast was injected. Contrast injected under live fluoroscopy. No contrast complications. See chart for type and volume of contrast used. Fluoroscopic Guidance: I was personally present during the use of  fluoroscopy. "Tunnel Vision Technique" used to obtain the best possible view of the target area. Parallax error corrected before commencing the procedure. "Direction-depth-direction" technique used to introduce the needle under continuous pulsed fluoroscopy. Once target was reached, antero-posterior, oblique, and lateral fluoroscopic projection used confirm needle placement in all planes. Images permanently stored in EMR. Interpretation: I personally interpreted the imaging intraoperatively. Adequate needle placement confirmed in multiple planes. Appropriate spread of contrast into desired area was observed. No evidence of afferent or efferent intravascular uptake. No intrathecal or subarachnoid spread observed. Permanent images saved into the patient's record.  Post-operative Assessment:  Post-procedure Vital Signs:  Pulse/HCG Rate: 8073 Temp: (!) 97.1 F (36.2 C) Resp: 17 BP: (!) 100/50 SpO2: 97 %  EBL: None  Complications: No immediate post-treatment complications observed by team, or reported by patient.  Note: The patient tolerated the entire procedure well. A repeat set of vitals were taken after the procedure and the patient was kept under observation following institutional policy, for this type of procedure. Post-procedural neurological assessment was performed, showing return to baseline, prior to discharge. The patient was provided with post-procedure discharge instructions, including a section on how to identify potential problems. Should any problems arise concerning this procedure, the patient was given instructions to immediately contact us , at any time, without hesitation. In any case, we plan to contact the patient by telephone for a follow-up status report regarding this interventional procedure.  Comments:  No additional relevant information.  Plan of Care (POC)  Orders:  Orders Placed This Encounter  Procedures   Cervical Epidural Injection    Indication(s): Radiculitis  and cervicalgia associated with cervical degenerative disc disease. Position: Prone Imaging guidance: Fluoroscopy required. Contrast required unless contraindicated by allergy or severe CKD. Equipment & Materials: Epidural tray & needle.    Scheduling Instructions:     Procedure: Cervical Epidural Steroid Injection/Block     Planned Level(s): C7-T1     Laterality: Left-sided     Anxiolysis: Patient's choice.     Timeframe: Today    Where will this procedure be performed?:   [facility]             by Dr. Lorin [facility] 1-60 MIN NO REPORT    Intraoperative interpretation by procedural physician at [facility].    Standing Status:   Standing    Number of Occurrences:   1    Reason for exam::   Assistance in needle guidance and placement for procedures requiring needle placement in or near specific anatomical locations not easily accessible without such assistance.   Informed Consent Details: Physician/Practitioner Attestation; Transcribe to consent form and obtain patient signature    Nursing instructions: Transcribe to consent form and obtain patient signature. Always confirm laterality of pain with Ms. Bernetta Brilliant, before procedure.    Physician/Practitioner attestation of informed consent for procedure/surgical case:   I, the physician/practitioner, attest that I have discussed with the patient the benefits, risks, side effects, alternatives, likelihood of achieving goals and potential problems during recovery for the procedure that I have provided informed consent.    Procedure:   Cervical Epidural Steroid Injection (CESI) under fluoroscopic guidance    Physician/Practitioner performing the procedure:   Stefhanie Kachmar A. Barth Borne MD    Indication/Reason:   Indications: Cervicalgia (neck pain), cervical radicular pain, radiculitis (arm/shoulder pain, numbness, and/or weakness), degenerative disc disease, severe enough to greatly impact quality of life or function.    Provide equipment / supplies at bedside    Procedural tray: Epidural Tray (Disposable  single use) Skin infiltration needle: Regular 1.5-in, 25-G, (x1) Block needle size: Regular standard Catheter: No catheter required    Standing Status:   Standing    Number of Occurrences:   1    Specify:   Epidural Tray   Saline lock IV    Have LR [phone] mL available and administer at 125 mL/hr if patient becomes hypotensive.    Standing Status:   Standing    Number of Occurrences:   1   Miscellanous precautions    NOTE: Although It is true that patients can have allergies to shellfish and that shellfish contain iodine, most shellfish  allergies are due to two protein allergens present  in the shellfish: tropomyosins and parvalbumin. Not all patients with shellfish allergies are allergic to iodine. However, as a precaution, avoid using iodine containing products.    Standing Status:   Standing    Number of Occurrences:   1    Chronic Opioid Analgesic:  Oxycodone  IR 5 mg 1 tab PO TID (#90/mo) (15 mg/day) MME/day: 22.5 mg/day.    Medications ordered for procedure: Meds ordered this encounter  Medications   iohexol  (OMNIPAQUE ) 180 MG/ML injection 10 mL    Must be Myelogram-compatible. If not available, you may substitute with a water-soluble, non-ionic, hypoallergenic, myelogram-compatible radiological contrast medium.   lidocaine  (XYLOCAINE ) 2 % (with pres) injection 400 mg   pentafluoroprop-tetrafluoroeth (GEBAUERS) aerosol   midazolam  (VERSED ) 5 MG/5ML injection 0.5-2 mg    Make sure Flumazenil is available in the pyxis when using this medication. If oversedation occurs, administer 0.2 mg IV over 15 sec. If after 45 sec no response, administer 0.2 mg again over 1 min; may repeat at 1 min intervals; not to exceed 4 doses (1 mg)   fentaNYL  (SUBLIMAZE ) injection 25-50 mcg    Make sure Narcan  is available in the pyxis when using this medication. In the event of respiratory depression (RR< 8/min):  Titrate NARCAN  (naloxone ) in increments of 0.1 to 0.2 mg IV at 2-3 minute intervals, until desired degree of reversal.   sodium chloride  flush (NS) 0.9 % injection 1 mL   ropivacaine  (PF) 2 mg/mL (0.2%) (NAROPIN ) injection 1 mL   dexamethasone  (DECADRON ) injection 10 mg   Medications administered: We administered iohexol , lidocaine , pentafluoroprop-tetrafluoroeth, midazolam , fentaNYL , sodium chloride  flush, and dexamethasone .  See the medical record for exact dosing, route, and time of administration.  Follow-up plan:   Return in about 2 weeks (around 02/21/2024) for (Face2F), (PPE).       Interventional Therapies  Risk  Complexity Considerations:   Estimated body mass index is 60.59 kg/m as calculated from the following:   Height as of 06/18/21: 5\' 4"  (1.626 m).   Weight as of 06/18/21: 353 lb (160.1 kg).     NOTE: NO RFA until BMI less or equal to 35.  (Gastric bypass done on 06/03/2017) Iodine allergy  Contrast dye allergy  Shellfish allergy    Planned  Pending:   Ambulatory referral to interventional pain management practice for evaluation and possible bilateral lumbar facet medial branch radiofrequency ablation.  (Entered on 07/28/2023)   Under consideration:      Completed:   Therapeutic right L2-3 LESI x1 (04/29/2022) (100/100/60/60)  Therapeutic bilateral IA Monovisc knee inj. x1 (05/25/2022)  Therapeutic right trapezius TPI/MNB x1 (05/25/2022)  Therapeutic bilateral IA Zilretta  knee inj. x4 (03/25/2022) (100/100/80/80)  Therapeutic bilateral IA Hyalgan knee inj. x16 (06/24/2020) (100/100/60/60)  Palliative/therapeutic bilateral lumbar facet MBB x9 (07/07/2023) (100/100/70/70)  Therapeutic right IA hip injection x3 (07/07/2017) (100/50/25/>50)  Therapeutic left IA shoulder (glenohumeral joint) injection x1 (05/31/2017) (50/0/100/100)    Completed by other providers:   Therapeutic bilateral THR (total hip replacements) (Dr. Mozell Arias) (Right: 11/27/2019) (Left: 07/29/2015)     Therapeutic  Palliative (PRN) options:   Palliative bilateral knee injections  Palliative bilateral lumbar facet MBBs    Pharmacotherapy  Nonopioid transferred 06/24/2020: Lyrica        Recent Visits Date Type Provider Dept  01/18/24 Office Visit Karen Caroli, MD [facility]  12/26/23 Office Visit Karen Caroli, MD [facility]  Showing recent visits within past 90 days and meeting all other requirements Today's Visits Date Type Provider Dept  02/07/24  Procedure visit Karen Caroli, MD [facility]  Showing today's visits and meeting all other requirements Future Appointments Date Type Provider Dept  03/12/24 Appointment Karen Caroli, MD [facility]  04/02/24 Appointment Patel, Seema K, NP [facility]  Showing future appointments within next 90 days and meeting all other requirements  Disposition: Discharge home  Discharge (Date  Time): 02/07/2024; 1205 hrs.   Primary Care Physician: Karen Helms, MD Location: [facility] Note by: Karen Chafe, MD (TTS technology used. I apologize for any typographical errors that were not detected and corrected.) Date: 02/07/2024; Time: 12:30 PM  Disclaimer:  Medicine is not an Visual merchandiser. The only guarantee in medicine is that nothing is guaranteed. It is important to note that the decision to proceed with this intervention was based on the information collected from the patient. The Data and conclusions were drawn from the patient's questionnaire, the interview, and the physical examination. Because the information was provided in large part by the patient, it cannot be guaranteed that it has not been purposely or unconsciously manipulated. Every effort has been made to obtain as much relevant data as possible for this evaluation. It is important to note that the conclusions that lead to this procedure are derived in large part  from the available data. Always take into account that the treatment will also be dependent on availability of resources and existing treatment guidelines, considered by other Pain Management Practitioners as being common knowledge and practice, at the time of the intervention. For Medico-Legal purposes, it is also important to point out that variation in procedural techniques and pharmacological choices are the acceptable norm. The indications, contraindications, technique, and results of the above procedure should only be interpreted and judged by a Board-Certified Interventional Pain Specialist with extensive familiarity and expertise in the same exact procedure and technique.

## 2024-02-07 NOTE — Patient Instructions (Addendum)
 Post-Procedure Discharge Instructions  Instructions: Apply ice:  Purpose: This will minimize any swelling and discomfort after procedure.  When: Day of procedure, as soon as you get home. How: Fill a plastic sandwich bag with crushed ice. Cover it with a small towel and apply to injection site. How long: (15 min on, 15 min off) Apply for 15 minutes then remove x 15 minutes.  Repeat sequence on day of procedure, until you go to bed. Apply heat:  Purpose: To treat any soreness and discomfort from the procedure. When: Starting the next day after the procedure. How: Apply heat to procedure site starting the day following the procedure. How long: May continue to repeat daily, until discomfort goes away. Food intake: Start with clear liquids (like water) and advance to regular food, as tolerated.  Physical activities: Keep activities to a minimum for the first 8 hours after the procedure. After that, then as tolerated. Driving: If you have received any sedation, be responsible and do not drive. You are not allowed to drive for 24 hours after having sedation. Blood thinner: (Applies only to those taking blood thinners) You may restart your blood thinner 6 hours after your procedure. Insulin: (Applies only to Diabetic patients taking insulin) As soon as you can eat, you may resume your normal dosing schedule. Infection prevention: Keep procedure site clean and dry. Shower daily and clean area with soap and water. Post-procedure Pain Diary: Extremely important that this be done correctly and accurately. Recorded information will be used to determine the next step in treatment. For the purpose of accuracy, follow these rules: Evaluate only the area treated. Do not report or include pain from an untreated area. For the purpose of this evaluation, ignore all other areas of pain, except for the treated area. After your procedure, avoid taking a long nap and attempting to complete the pain diary after you  wake up. Instead, set your alarm clock to go off every hour, on the hour, for the initial 8 hours after the procedure. Document the duration of the numbing medicine, and the relief you are getting from it. Do not go to sleep and attempt to complete it later. It will not be accurate. If you received sedation, it is likely that you were given a medication that may cause amnesia. Because of this, completing the diary at a later time may cause the information to be inaccurate. This information is needed to plan your care. Follow-up appointment: Keep your post-procedure follow-up evaluation appointment after the procedure (usually 2 weeks for most procedures, 6 weeks for radiofrequencies). DO NOT FORGET to bring you pain diary with you.   Expect: (What should I expect to see with my procedure?) From numbing medicine (AKA: Local Anesthetics): Numbness or decrease in pain. You may also experience some weakness, which if present, could last for the duration of the local anesthetic. Onset: Full effect within 15 minutes of injected. Duration: It will depend on the type of local anesthetic used. On the average, 1 to 8 hours.  From steroids (Applies only if steroids were used): Decrease in swelling or inflammation. Once inflammation is improved, relief of the pain will follow. Onset of benefits: Depends on the amount of swelling present. The more swelling, the longer it will take for the benefits to be seen. In some cases, up to 10 days. Duration: Steroids will stay in the system x 2 weeks. Duration of benefits will depend on multiple posibilities including persistent irritating factors. Side-effects: If present, they may typically  last 2 weeks (the duration of the steroids). Frequent: Cramps (if they occur, drink Gatorade and take over-the-counter Magnesium 450-500 mg once to twice a day); water retention with temporary weight gain; increases in blood sugar; decreased immune system response; increased  appetite. Occasional: Facial flushing (red, warm cheeks); mood swings; menstrual changes. Uncommon: Long-term decrease or suppression of natural hormones; bone thinning. (These are more common with higher doses or more frequent use. This is why we prefer that our patients avoid having any injection therapies in other practices.)  Very Rare: Severe mood changes; psychosis; aseptic necrosis. From procedure: Some discomfort is to be expected once the numbing medicine wears off. This should be minimal if ice and heat are applied as instructed.  Call if: (When should I call?) You experience numbness and weakness that gets worse with time, as opposed to wearing off. New onset bowel or bladder incontinence. (Applies only to procedures done in the spine)  Emergency Numbers: Durning business hours (Monday - Thursday, 8:00 AM - 4:00 PM) (Friday, 9:00 AM - 12:00 Noon): [phone] After hours: [phone] NOTE: If you are having a problem and are unable connect with, or to talk to a provider, then go to your nearest urgent care or emergency department. If the problem is serious and urgent, please call 911.  Moderate Conscious Sedation, Adult, Care After After the procedure, it is common to have: Sleepiness for a few hours. Impaired judgment for a few hours. Trouble with balance. Nausea or vomiting if you eat too soon. Follow these instructions at home: For the time period you were told by your health care provider:  Rest. Do not participate in activities where you could fall or become injured. Do not drive or use machinery. Do not drink alcohol. Do not take sleeping pills or medicines that cause drowsiness. Do not make important decisions or sign legal documents. Do not take care of children on your own. Eating and drinking Follow instructions from your health care provider about what you may eat and drink. Drink enough fluid to keep your urine pale yellow. If you vomit: Drink clear  fluids slowly and in small amounts as you are able. Clear fluids include water, ice chips, low-calorie sports drinks, and fruit juice that has water added to it (diluted fruit juice). Eat light and bland foods in small amounts as you are able. These foods include bananas, applesauce, rice, lean meats, toast, and crackers. General instructions Take over-the-counter and prescription medicines only as told by your health care provider. Have a responsible adult stay with you for the time you are told. Do not use any products that contain nicotine or tobacco. These products include cigarettes, chewing tobacco, and vaping devices, such as e-cigarettes. If you need help quitting, ask your health care provider. Return to your normal activities as told by your health care provider. Ask your health care provider what activities are safe for you. Your health care provider may give you more instructions. Make sure you know what you can and cannot do.   Contact a health care provider if: You are still sleepy or having trouble with balance after 24 hours. You feel light-headed. You vomit every time you eat or drink. You get a rash. You have a fever. You have redness or swelling around the IV site. Get help right away if: You have trouble breathing. You start to feel confused at home. These symptoms may be an emergency. Get help right away. Call 911. Do not wait to see  if the symptoms will go away. Do not drive yourself to the hospital. This information is not intended to replace advice given to you by your health care provider. Make sure you discuss any questions you have with your health care provider. Document Revised: 03/15/2022 Document Reviewed: 03/15/2022 Elsevier Patient Education  2024 [employer].

## 2024-02-07 NOTE — Progress Notes (Signed)
 Safety precautions to be maintained throughout the outpatient stay will include: orient to surroundings, keep bed in low position, maintain call bell within reach at all times, provide assistance with transfer out of bed and ambulation.

## 2024-02-09 ENCOUNTER — Ambulatory Visit: Admitting: Occupational Therapy

## 2024-02-13 DIAGNOSIS — F331 Major depressive disorder, recurrent, moderate: Secondary | ICD-10-CM | POA: Diagnosis not present

## 2024-02-23 ENCOUNTER — Ambulatory Visit: Attending: Nurse Practitioner | Admitting: Occupational Therapy

## 2024-02-23 DIAGNOSIS — E890 Postprocedural hypothyroidism: Secondary | ICD-10-CM | POA: Diagnosis not present

## 2024-02-23 DIAGNOSIS — E1169 Type 2 diabetes mellitus with other specified complication: Secondary | ICD-10-CM | POA: Diagnosis not present

## 2024-02-23 DIAGNOSIS — E1159 Type 2 diabetes mellitus with other circulatory complications: Secondary | ICD-10-CM | POA: Diagnosis not present

## 2024-02-23 DIAGNOSIS — I152 Hypertension secondary to endocrine disorders: Secondary | ICD-10-CM | POA: Diagnosis not present

## 2024-02-23 DIAGNOSIS — Z79899 Other long term (current) drug therapy: Secondary | ICD-10-CM | POA: Diagnosis not present

## 2024-02-23 DIAGNOSIS — E785 Hyperlipidemia, unspecified: Secondary | ICD-10-CM | POA: Diagnosis not present

## 2024-02-23 DIAGNOSIS — E118 Type 2 diabetes mellitus with unspecified complications: Secondary | ICD-10-CM | POA: Diagnosis not present

## 2024-02-24 DIAGNOSIS — M7542 Impingement syndrome of left shoulder: Secondary | ICD-10-CM | POA: Diagnosis not present

## 2024-02-28 ENCOUNTER — Other Ambulatory Visit: Payer: Self-pay

## 2024-02-28 ENCOUNTER — Telehealth: Payer: Self-pay | Admitting: Urology

## 2024-02-28 MED ORDER — NITROFURANTOIN MONOHYD MACRO 100 MG PO CAPS
100.0000 mg | ORAL_CAPSULE | Freq: Every day | ORAL | 4 refills | Status: DC
Start: 2024-02-28 — End: 2024-05-21

## 2024-02-28 NOTE — Telephone Encounter (Signed)
 PT was scheduled to be seen on 06/30 for an appointment with Dr.Macdiarmid. Pt states they would like there urine culture reviewed to specify if they need a follow up appt? Pt states they did a urine culture 06/12 with [facility]. Pt is also requesting a medication refill Macrodantin .

## 2024-02-28 NOTE — Telephone Encounter (Signed)
 Refills sent. Appt made for 05/28/2024.  Pt states they only did a UA at [facility] and wanted us  to do a culture. Pt doesn't have symptoms but was under the impression she needed a culture to get refills. Pt was informed we will give her enough refills until her next appt with Dr.Macdiarmid. pt voiced understanding.

## 2024-03-05 DIAGNOSIS — F331 Major depressive disorder, recurrent, moderate: Secondary | ICD-10-CM | POA: Diagnosis not present

## 2024-03-07 ENCOUNTER — Ambulatory Visit: Admitting: Occupational Therapy

## 2024-03-08 DIAGNOSIS — G43719 Chronic migraine without aura, intractable, without status migrainosus: Secondary | ICD-10-CM | POA: Diagnosis not present

## 2024-03-12 ENCOUNTER — Encounter

## 2024-03-12 ENCOUNTER — Ambulatory Visit: Admitting: Pain Medicine

## 2024-03-12 ENCOUNTER — Encounter: Payer: Self-pay | Admitting: Pain Medicine

## 2024-03-12 ENCOUNTER — Ambulatory Visit
Admission: RE | Admit: 2024-03-12 | Discharge: 2024-03-12 | Disposition: A | Discharge status: Home / Self Care | Source: Ambulatory Visit | Attending: Internal Medicine | Admitting: Internal Medicine

## 2024-03-12 ENCOUNTER — Ambulatory Visit: Payer: Self-pay | Admitting: Urology

## 2024-03-12 VITALS — BP 91/68 | HR 95 | Temp 97.2°F | Ht 63.0 in | Wt 333.0 lb

## 2024-03-12 DIAGNOSIS — R937 Abnormal findings on diagnostic imaging of other parts of musculoskeletal system: Secondary | ICD-10-CM

## 2024-03-12 DIAGNOSIS — G8929 Other chronic pain: Secondary | ICD-10-CM | POA: Diagnosis not present

## 2024-03-12 DIAGNOSIS — M542 Cervicalgia: Secondary | ICD-10-CM | POA: Insufficient documentation

## 2024-03-12 DIAGNOSIS — A6000 Herpesviral infection of urogenital system, unspecified: Secondary | ICD-10-CM | POA: Diagnosis not present

## 2024-03-12 DIAGNOSIS — M79601 Pain in right arm: Secondary | ICD-10-CM | POA: Insufficient documentation

## 2024-03-12 DIAGNOSIS — M19012 Primary osteoarthritis, left shoulder: Secondary | ICD-10-CM | POA: Insufficient documentation

## 2024-03-12 DIAGNOSIS — M25512 Pain in left shoulder: Secondary | ICD-10-CM

## 2024-03-12 DIAGNOSIS — Z09 Encounter for follow-up examination after completed treatment for conditions other than malignant neoplasm: Secondary | ICD-10-CM | POA: Insufficient documentation

## 2024-03-12 DIAGNOSIS — N958 Other specified menopausal and perimenopausal disorders: Secondary | ICD-10-CM | POA: Insufficient documentation

## 2024-03-12 DIAGNOSIS — M79602 Pain in left arm: Secondary | ICD-10-CM | POA: Diagnosis not present

## 2024-03-12 DIAGNOSIS — M5412 Radiculopathy, cervical region: Secondary | ICD-10-CM | POA: Diagnosis not present

## 2024-03-12 DIAGNOSIS — N3946 Mixed incontinence: Secondary | ICD-10-CM | POA: Insufficient documentation

## 2024-03-12 DIAGNOSIS — R936 Abnormal findings on diagnostic imaging of limbs: Secondary | ICD-10-CM | POA: Insufficient documentation

## 2024-03-12 DIAGNOSIS — Z1231 Encounter for screening mammogram for malignant neoplasm of breast: Secondary | ICD-10-CM | POA: Diagnosis not present

## 2024-03-12 DIAGNOSIS — N951 Menopausal and female climacteric states: Secondary | ICD-10-CM | POA: Diagnosis not present

## 2024-03-12 DIAGNOSIS — Z124 Encounter for screening for malignant neoplasm of cervix: Secondary | ICD-10-CM | POA: Diagnosis not present

## 2024-03-12 DIAGNOSIS — Z01411 Encounter for gynecological examination (general) (routine) with abnormal findings: Secondary | ICD-10-CM | POA: Diagnosis not present

## 2024-03-12 NOTE — Patient Instructions (Signed)

## 2024-03-12 NOTE — Progress Notes (Signed)
 PROVIDER NOTE: Interpretation of information contained herein should be left to medically-trained personnel. Specific patient instructions are provided elsewhere under Patient Instructions section of medical record. This document was created in part using AI and STT-dictation technology, any transcriptional errors that may result from this process are unintentional.  Patient: Karen Lewis  Service: E/M   PCP: [name], MD  DOB: August 27, 1971  DOS: 03/12/2024  Provider: Eric Lewis Como, MD  MRN: [id number]  Delivery: Face-to-face  Specialty: Interventional Pain Management  Type: Established Patient  Setting: Ambulatory outpatient facility  Specialty designation: 09  Referring Prov.: [name], MD  Location: Outpatient office facility       History of present illness (HPI) Karen Lewis, a 53 y.o. year old female, is here today because of her Cervicalgia [M54.2]. Karen Lewis primary complain today is Shoulder Pain (left)  Pertinent problems: Karen Lewis has Fibromyalgia; Chronic knee pain (1ry area of Pain) (Bilateral) (R>L); Chronic low back pain (3ry area of Pain) (Bilateral) (R>L) w/o sciatica; Lumbar facet syndrome (Bilateral) (R>L); Secondary osteoarthritis of multiple sites; Grade 1  Anterolisthesis of L3/L4 & L4/L5 (1.4 cm); Chronic hip pain s/p THR (2ry area of pain) (Bilateral); S/P THR (total hip replacement) (Left); Lumbar spondylosis; Chronic pain syndrome; Neurogenic pain; Upper extremity pain; Chronic shoulder pain (Left); Osteoarthritis of shoulder (Left); Osteoarthritis of hip (Right); Secondary Osteoarthritis of knee (Bilateral) (R>L); Spondylosis without myelopathy or radiculopathy, lumbosacral region; Panniculitis; Leg swelling; Edema of both legs; History of total hip replacement (Bilateral); S/P THR (total hip replacement) (Right) (11/27/2019); Pain and numbness of left upper extremity; Cervical radiculitis (Left); Aftercare following joint replacement surgery; Bilateral  primary osteoarthritis of knee; Osteoarthritis of lumbar spine without myelopathy or radiculopathy; Lumbosacral radiculopathy at S1 (Left); Tricompartment osteoarthritis of knees (Bilateral); Lumbosacral radiculopathy at L2 (Right); Abnormal MRI, lumbar spine (07/01/2022); Chronic thigh pain (Right); Weakness of leg (Right); Lumbar interspinous bursitis (L2-L5); Spinal enthesopathy of lumbar region [facility]); Lumbar facet arthropathy (Multilevel) (Bilateral); Lumbar central spinal stenosis, w/o claudication (L2-3, L4-5, L5-S1); Lumbar foraminal stenosis (Bilateral: L2-3, L4-5) (L>R); Lumbosacral lateral recess stenosis (Bilateral: L4-5, L5-S1); Trigger point of right shoulder region; Presence of artificial hip joint, bilateral; Lumbar facet joint pain; DDD (degenerative disc disease), lumbosacral; Cervical radiculopathy at C6; Cervicalgia; DDD (degenerative disc disease), cervical; Abnormal MRI, cervical spine (01/17/2024); Abnormal MRI, shoulder (02/09/2024) (Left); Osteoarthritis of glenohumeral joint (Left); and Osteoarthritis of AC (acromioclavicular) joint (Left) on their pertinent problem list.  Pain Assessment: Severity of Chronic pain is reported as a 7 /10. Location: Shoulder Left/pain radiaties down her left arm. Onset: More than a month ago. Quality:  . Timing: Constant. Modifying factor(s): Meds, heat, laying down, procedures. Vitals:  height is 5' 3 (1.6 m) and weight is 333 lb (151 kg) (abnormal). Her temperature is 97.2 F (36.2 C) (abnormal). Her blood pressure is 91/68 and her pulse is 95. Her oxygen saturation is 98%.  BMI: Estimated body mass index is 58.99 kg/m as calculated from the following:   Height as of this encounter: 5' 3 (1.6 m).   Weight as of this encounter: 333 lb (151 kg).  Last encounter: 01/18/2024. Last procedure: 02/07/2024.  Reason for encounter: post-procedure evaluation and assessment.  On 12/31/2023 the patient had an MRI of the cervical spine that shows the patient to  have spondylosis that seems to be getting worse at the C5-6 and C6-7 level where there is mild deformity of the ventral cord and moderate to moderately severe bilateral foraminal narrowing.  In addition on 01/30/2024 the patient also had an MRI of the left shoulder which indicates that she has insertional tendinosis of the supra and infraspinatus tendons.  No full-thickness tears present.  Moderate tendinosis of the intra-articular segment of the biceps tendon without full-thickness tear.  AC joint and glenohumeral joint arthrosis.  Discussed the use of AI scribe software for clinical note transcription with the patient, who gave verbal consent to proceed.  History of Present Illness   Karen Lewis is a 53 year old female with cervical foraminal stenosis and left shoulder tendinosis who presents with chronic shoulder pain.  She has been experiencing chronic left shoulder pain, which radiates to the shoulder and upper arm. A cervical epidural provided some relief, but the shoulder pain persists.  Her cervical MRI showed foraminal stenosis at multiple levels. The C4 nerve affects shoulder sensation, contributing to her pain.  She has a history of right shoulder injury, which led to compensatory overuse of the left shoulder, resulting in the current pain. Previous imaging showed significant spurring and tendinosis, with mild partial tears in the supraspinatus and infraspinatus tendons. Physical therapy in the past exacerbated her symptoms due to underlying spurring.  The pain has been severe enough to disrupt her sleep and daily activities. She has received injections in the past, which provided limited relief. She is not currently on any blood thinners and has not had recent injections other than those for her back.     Post-Procedure Evaluation   Procedure: Cervical Epidural Steroid injection (CESI) (Interlaminar) #1  Laterality: Left  Level: C7-T1 Imaging: Fluoroscopy-assisted DOS: 02/07/2024   Performed by: Eric Como, MD Anesthesia: Local anesthesia (1-2% Lidocaine ) Anxiolysis: IV Versed  2.0 mg Sedation: Moderate Sedation Fentanyl  1 mL (50 mcg)   Purpose: Diagnostic/Therapeutic Indications: Cervicalgia, cervical radicular pain, degenerative disc disease, severe enough to impact quality of life or function. 1. DDD (degenerative disc disease), cervical   2. Cervicalgia   3. Cervical radiculopathy at C6   4. Cervical radiculitis (Left)   5. Chronic shoulder pain (Left)   6. Pain in both upper extremities   7. Allergy to iodine   8. Class 3 severe obesity due to excess calories with serious comorbidity and body mass index (BMI) of 60.0 to 69.9 in adult    NAS-11 score:   Pre-procedure: 5 /10   Post-procedure: 0-No pain/10     Effectiveness:  Initial hour after procedure: 100 %. Subsequent 4-6 hours post-procedure: 100 %. Analgesia past initial 6 hours: 50 % (pain in neck and upper is better, the left shoulder and arm is worse). Ongoing improvement:  Analgesic: The patient indicates having attained 100% relief of the pain for the duration of the local anesthetic followed by a decrease to an ongoing 50% improvement of the neck pain and upper back however her left shoulder and arm pain seems to be back and she describes that it feels worse. Function: Somewhat improved ROM: Somewhat improved   Pharmacotherapy Assessment   Chronic Opioid Analgesic:  Oxycodone  IR 5 mg 1 tab PO TID (#90/mo) (15 mg/day) MME/day: 22.5 mg/day.   Monitoring: [place] PMP: PDMP reviewed during this encounter.       Pharmacotherapy: No side-effects or adverse reactions reported. Compliance: No problems identified. Effectiveness: Clinically acceptable.  Karen Dorothe DELENA, RN  03/12/2024  1:11 PM  Sign when Signing Visit Safety precautions to be maintained throughout the outpatient stay will include: orient to surroundings, keep bed in low position, maintain call bell within reach at all  times,  provide assistance with transfer out of bed and ambulation.     UDS:  Summary  Date Value Ref Range Status  06/08/2023 Note  Final    Comment:    ==================================================================== [employer] 13 (MW) ==================================================================== Test                             Result       Flag       Units  Drug Present and Declared for Prescription Verification   7-aminoclonazepam              68           EXPECTED   ng/mg creat    7-aminoclonazepam is an expected metabolite of clonazepam . Source of    clonazepam  is a scheduled prescription medication.    Oxycodone                       416          EXPECTED   ng/mg creat   Oxymorphone                    108          EXPECTED   ng/mg creat   Noroxycodone                   1904         EXPECTED   ng/mg creat   Noroxymorphone                 45           EXPECTED   ng/mg creat    Sources of oxycodone  are scheduled prescription medications.    Oxymorphone, noroxycodone, and noroxymorphone are expected    metabolites of oxycodone . Oxymorphone is also available as a    scheduled prescription medication.    Butalbital                      PRESENT      EXPECTED ==================================================================== Test                      Result    Flag   Units      Ref Range   Creatinine              262              mg/dL      >=79 ==================================================================== Declared Medications:  The flagging and interpretation on this report are based on the  following declared medications.  Unexpected results may arise from  inaccuracies in the declared medications.   **Note: The testing scope of this panel includes these medications:   Butalbital  (Fioricet )  Clonazepam  (Klonopin )  Oxycodone    **Note: The testing scope of this panel does not include the  following reported medications:   Acetaminophen   Acetaminophen   (Fioricet )  Albuterol  (Ventolin  HFA)  Amitriptyline  (Elavil )  Biotin   Caffeine  (Fioricet )  Calcium  (Tums)  Diphenhydramine  (Benadryl )  Duloxetine (Cymbalta)  Epinephrine  (EpiPen )  Famotidine  (Pepcid )  Fluticasone  (Breo)  Galcanezumab (Emgality)  Indeterminate Medication  Levocetirizine (Xyzal )  Levothyroxine  (Synthroid )  Lisinopril  (Zestril )  Magnesium  (Mag-Ox)  Meclizine  (Antivert )  Metformin   Metoprolol  (Lopressor )  Montelukast  (Singulair )  Multivitamin  Naloxone  (Narcan )  Nitrofurantoin  (Macrodantin )  Omeprazole (Prilosec)  Phentermine (Adipex)  Pregabalin  (Lyrica )  Rizatriptan  Rosuvastatin  (Crestor )  Semaglutide (Ozempic)  Tizanidine  (Zanaflex )  Topical Diclofenac  (Voltaren )  Topiramate  (Topamax )  Vilanterol (Breo)  Vitamin D2 ==================================================================== For clinical consultation, please call [phone]. ====================================================================     No results found for: CBDTHCR No results found for: D8THCCBX No results found for: D9THCCBX  ROS  Constitutional: Denies any fever or chills Gastrointestinal: No reported hemesis, hematochezia, vomiting, or acute GI distress Musculoskeletal: Denies any acute onset joint swelling, redness, loss of ROM, or weakness Neurological: No reported episodes of acute onset apraxia, aphasia, dysarthria, agnosia, amnesia, paralysis, loss of coordination, or loss of consciousness  Medication Review  Alcohol Prep, Biotin , DULoxetine, Dupilumab, EPINEPHrine , Galcanezumab-gnlm, QUEtiapine , Semaglutide(0.25 or 0.5MG /DOS), Tab-A-Vite, acetaminophen , acidophilus, albuterol , amitriptyline , butalbital -acetaminophen -caffeine , calcium  carbonate, clonazePAM , cyanocobalamin , diclofenac  sodium, diphenhydrAMINE , ergocalciferol , famotidine , glucose blood, hydrochlorothiazide, levocetirizine, levothyroxine , lisinopril , magnesium  oxide, meclizine , metFORMIN , metoprolol   tartrate, montelukast , naloxone , naphazoline-pheniramine, nitrofurantoin  (macrocrystal-monohydrate), nystatin , omeprazole, oxyCODONE , phentermine, pregabalin , rizatriptan, rosuvastatin , tiZANidine , and topiramate   History Review  Allergy: Karen Lewis is allergic to bactrim [sulfamethoxazole-trimethoprim], omalizumab, ciprofloxacin, shellfish allergy, and nsaids. Drug: Karen Lewis  reports no history of drug use. Alcohol:  reports no history of alcohol use. Tobacco:  reports that she has never smoked. She has never used smokeless tobacco. Social: Karen Lewis  reports that she has never smoked. She has never used smokeless tobacco. She reports that she does not drink alcohol and does not use drugs. Medical:  has a past medical history of Anemia, Anginal pain (HCC), Anxiety, Arthralgia of hip (07/29/2015), Arthritis, Arthritis, degenerative (07/29/2015), Asthma, Cephalalgia (07/25/2014), Dependence on unknown drug (HCC), Depression, Diabetes mellitus without complication (HCC), Difficult intubation, Dysrhythmia, Eczema, Fibromyalgia, Gastritis, GERD (gastroesophageal reflux disease), Gonalgia (07/29/2015), Gout, H/O cardiovascular disorder (03/10/2015), H/O surgical procedure (12/05/2012), H/O thyroid  disease (03/10/2015), Headache, Herpes, History of artificial joint (07/29/2015), History of hiatal hernia, Hypertension, Hypomagnesemia, Hypothyroidism, IDA (iron  deficiency anemia) (05/28/2019), LBP (low back pain) (07/29/2015), Neuromuscular disorder (HCC), Obesity, PCOS (polycystic ovarian syndrome), Primary osteoarthritis of both knees (07/29/2015), Sleep apnea, Thyroid  nodule (bilateral), and Umbilical hernia. Surgical: Karen Lewis  has a past surgical history that includes Laparoscopic partial gastrectomy; Shoulder arthroscopy (Right); Carpal tunnel release (Bilateral); Diagnostic laparoscopy; Cholecystectomy; Trigger finger release (Right); Thyroidectomy (N/A, 11/12/2015); left trigger finger; Roux-en-Y Gastric Bypass  (06/03/2017); Hiatal hernia repair; peniculectomy (N/A, 07/05/2018); Total hip arthroplasty (Right, 11/27/2019); Joint replacement (Bilateral, hip); Appendectomy; Trigger finger release (Right, 04/24/2020); Colonoscopy with propofol  (N/A, 02/11/2021); and Esophagogastroduodenoscopy (egd) with propofol  (N/A, 02/11/2021). Family: family history includes Alcohol abuse in her father and mother; Anxiety disorder in her father and mother; Brain cancer (age of onset: 30) in her paternal uncle; Breast cancer (age of onset: 26) in her paternal aunt and paternal aunt; COPD in her father; Depression in her brother, father, and mother; Diabetes in her brother, father, and mother; Hypertension in her brother, father, and mother; Kidney cancer in her mother; Kidney failure in her father; Post-traumatic stress disorder in her father; Sleep apnea in her brother, father, and mother.  Laboratory Chemistry Profile   Renal Lab Results  Component Value Date   BUN 17 08/09/2022   CREATININE 1.04 (H) 08/09/2022   GFRAA >60 04/16/2020   GFRNONAA >60 08/09/2022    Hepatic Lab Results  Component Value Date   AST 25 08/07/2022   ALT 40 08/07/2022   ALBUMIN 3.5 08/07/2022   ALKPHOS 74 08/07/2022   LIPASE 37 08/07/2022    Electrolytes Lab Results  Component Value Date   NA 142 08/09/2022   K 4.8 08/09/2022   CL 116 (H) 08/09/2022   CALCIUM  7.8 (L) 08/09/2022  MG 2.5 (H) 08/07/2022   PHOS 5.5 (H) 05/22/2019    Bone Lab Results  Component Value Date   VD25OH 27.4 (L) 11/24/2015   VD125OH2TOT 41.5 11/24/2015    Inflammation (CRP: Acute Phase) (ESR: Chronic Phase) Lab Results  Component Value Date   CRP 0.7 09/27/2019   ESRSEDRATE 30 (H) 09/27/2019   LATICACIDVEN 1.7 08/07/2022         Note: Above Lab results reviewed.  Recent Imaging Review  MR SHOULDER LEFT WO CONTRAST MR SHOULDER WITHOUT IV CONTRAST LEFT  COMPARISON: None.  CLINICAL HISTORY: Left shoulder pain  PULSE SEQUENCES: Ax PD FS, Sag  T2 FS, Cor T1 & COR T2 FS  FINDINGS:  Bones: Moderate AC joint arthrosis and mild/moderate glenohumeral arthrosis. There is osteophyte formation and mild joint space loss. Mild reactive edema seen in the [facility] joint. Trace subacromial and subdeltoid bursal fluid is present.  Rotator cuff: There is insertional tendinosis of the supraspinatus and infraspinatus tendons with a mild partial tear of the supraspinatus tendon. No full-thickness tear is present. Subscapularis tendon demonstrates insertional tendinosis. Teres minor tendon is intact. There is no significant fatty atrophy of the rotator cuff muscles.  Labrum and biceps tendon: There is tendinosis of the intra-articular segment the biceps tendon with marked increased signal. There is no full-thickness tear. There is mild tenosynovitis in the bicipital groove. The labrum is intact.  IMPRESSION: Insertional tendinosis of the supra and infraspinatus tendons. No full-thickness tear is present.  Moderate tendinosis of the intra-articular segment biceps tendon without full-thickness tear.  AC joint and glenohumeral joint arthrosis.  Electronically signed by: Norleen Satchel MD 02/09/2024 03:15 PM EDT RP Workstation: [id number] Note: Reviewed        Physical Exam  General appearance: Well nourished, well developed, and well hydrated. In no apparent acute distress Mental status: Alert, oriented x 3 (person, place, & time)       Respiratory: No evidence of acute respiratory distress Eyes: PERLA Vitals: BP 91/68   Pulse 95   Temp (!) 97.2 F (36.2 C)   Ht 5' 3 (1.6 m)   Wt (!) 333 lb (151 kg)   SpO2 98%   BMI 58.99 kg/m  BMI: Estimated body mass index is 58.99 kg/m as calculated from the following:   Height as of this encounter: 5' 3 (1.6 m).   Weight as of this encounter: 333 lb (151 kg). Ideal: Ideal body weight: 52.4 kg (115 lb 8.3 oz) Adjusted ideal body weight: 91.9 kg (202 lb 8.2 oz)  Assessment   Diagnosis Status   1. Cervicalgia   2. Cervical radiculopathy at C6   3. Cervical radiculitis (Left)   4. Chronic shoulder pain (Left)   5. Pain in both upper extremities   6. Postop check   7. Abnormal MRI, cervical spine (01/17/2024)   8. Abnormal MRI, shoulder (02/09/2024) (Left)   9. Osteoarthritis of shoulder (Left)   10. Osteoarthritis of glenohumeral joint (Left)   11. Osteoarthritis of AC (acromioclavicular) joint (Left)    Controlled Controlled Controlled   Updated Problems: Problem  Abnormal MRI, shoulder (02/09/2024) (Left)   (02/09/2024) LEFT SHOULDER MRI FINDINGS: Bones: Moderate AC joint arthrosis and mild/moderate glenohumeral arthrosis. There is osteophyte formation and mild joint space loss. Mild reactive edema seen in the [facility] joint. Trace subacromial and subdeltoid bursal fluid is present. Rotator cuff: There is insertional tendinosis of the supraspinatus and infraspinatus tendons with a mild partial tear of the supraspinatus tendon. No full-thickness tear is present. Subscapularis  tendon demonstrates insertional tendinosis. Teres minor tendon is intact. There is no significant fatty atrophy of the rotator cuff muscles. Labrum and biceps tendon: There is tendinosis of the intra-articular segment the biceps tendon with marked increased signal. There is no full-thickness tear. There is mild tenosynovitis in the bicipital groove. The labrum is intact.  IMPRESSION: Insertional tendinosis of the supra and infraspinatus tendons. No full-thickness tear is present.  Moderate tendinosis of the intra-articular segment biceps tendon without full-thickness tear.  AC joint and glenohumeral joint arthrosis.   Osteoarthritis of glenohumeral joint (Left)  Osteoarthritis of AC (acromioclavicular) joint (Left)    Plan of Care  Problem-specific:  Assessment and Plan    Left shoulder tendinosis and partial tears   MRI reveals tendinosis of the supraspinatus and infraspinatus tendons with  mild partial tears, edema, and trace subacromial and subdeltoid bursitis. The condition is chronic, worsened by compensating for a previous right shoulder injury. Untreated spurring risks tendon shredding with movement. Administer a targeted shoulder injection using x-ray guidance to cover the acromioclavicular joint, subacromial bursa, and glenohumeral joint. Initiate a six-week physical therapy program to strengthen shoulder muscles. Reevaluate after physical therapy to assess progress and determine further interventions.  Left shoulder osteoarthritis   MRI indicates arthrosis contributing to pain and functional limitations. Prefer conservative management initially, with surgical intervention considered if symptoms persist. Incorporate osteoarthritis management into the physical therapy regimen. Consider surgery if conservative measures fail.  Right shoulder post-surgical changes   Post-arthroscopy and Mumford procedure addressed spurring and rotator cuff issues. Compensation with the left shoulder has led to current problems. Previous surgery involved clavicle bone clipping, which may be necessary for the left shoulder if conservative measures fail.  Cervical foraminal stenosis   MRI shows moderate to severe stenosis at C5-6 and less severe at C3-4, affecting the C4 nerve, which provides shoulder sensation. A cervical epidural provided some relief, indicating a cervical component to the pain. Consider further cervical epidural injections if symptoms persist.       Karen Lewis has a current medication list which includes the following long-term medication(s): albuterol , diphenhydramine , hydrochlorothiazide, magnesium  oxide, montelukast , naloxone , oxycodone , oxycodone , oxycodone , rizatriptan, and rizatriptan.  Pharmacotherapy (Medications Ordered): No orders of the defined types were placed in this encounter.  Orders:  Orders Placed This Encounter  Procedures   SHOULDER INJECTION     Standing Status:   Future    Expiration Date:   06/12/2024    Scheduling Instructions:     Procedure: Intra-articular shoulder (Glenohumeral) joint and (AC) Acromioclavicular joint injection     Side: Left-sided     Level: Glenohumeral joint and (AC) Acromioclavicular joint     Sedation: Patient's choice.     Timeframe: As permitted by the schedule    Where will this procedure be performed?:   [facility]   Nursing Instructions:    Please complete this patient's postprocedure evaluation.    Scheduling Instructions:     Please complete this patient's postprocedure evaluation.     Interventional Therapies  Risk  Complexity Considerations:   Estimated body mass index is 60.59 kg/m as calculated from the following:   Height as of 06/18/21: 5' 4 (1.626 m).   Weight as of 06/18/21: 353 lb (160.1 kg).     NOTE: NO RFA until BMI less or equal to 35.  (Gastric bypass done on 06/03/2017) Iodine allergy  Contrast dye allergy  Shellfish allergy    Planned  Pending:   Diagnostic/therapeutic left IA shoulder (glenohumeral +  AC) joint inj. #2 (first of 2025)    Under consideration:   Diagnostic/therapeutic left IA shoulder (glenohumeral + AC) joint inj. #2 (first of 2025)    Completed:   Therapeutic right L2-3 LESI x1 (04/29/2022) (100/100/60/60)  Therapeutic bilateral IA Monovisc knee inj. x1 (05/25/2022)  Therapeutic right trapezius TPI/MNB x1 (05/25/2022)  Therapeutic bilateral IA Zilretta  knee inj. x4 (03/25/2022) (100/100/80/80)  Therapeutic bilateral IA Hyalgan knee inj. x16 (06/24/2020) (100/100/60/60)  Palliative/therapeutic bilateral lumbar facet MBB x9 (07/07/2023) (100/100/70/70)  Therapeutic right IA hip injection x3 (07/07/2017) (100/50/25/>50)  Therapeutic left IA shoulder (glenohumeral joint) injection x1 (05/31/2017) (50/0/100/100)  (07/28/2023) referral to interventional pain management practice for bilateral lumbar facet RFA    Completed by other providers:    Therapeutic bilateral THR (total hip replacements) (Dr. Kathlynn) (Right: 11/27/2019) (Left: 07/29/2015)    Therapeutic  Palliative (PRN) options:   Palliative bilateral knee injections  Palliative bilateral lumbar facet MBBs    Pharmacotherapy  Nonopioid transferred 06/24/2020: Lyrica       Return for (ECT): (L) IA Shoulder (AC + Glenohumeral) inj. #2.    Recent Visits Date Type Provider Dept  02/07/24 Procedure visit Lewis Glisson, MD [facility]  01/18/24 Office Visit Lewis Glisson, MD [facility]  12/26/23 Office Visit Lewis Glisson, MD [facility]  Showing recent visits within past 90 days and meeting all other requirements Today's Visits Date Type Provider Dept  03/12/24 Office Visit Lewis Glisson, MD [facility] [facility]  Showing today's visits and meeting all other requirements Future Appointments Date Type Provider Dept  04/02/24 Appointment Patel, Seema K, NP [facility]  Showing future appointments within next 90 days and meeting all other requirements  I discussed the assessment and treatment plan with the patient. The patient was provided an opportunity to ask questions and all were answered. The patient agreed with the plan and demonstrated an understanding of the instructions.  Patient advised to call back or seek an in-person evaluation if the symptoms or condition worsens.  Duration of encounter: 30 minutes.  Total time on encounter, as per AMA guidelines included both the face-to-face and non-face-to-face time personally spent by the physician and/or other qualified health care professional(s) on the day of the encounter (includes time in activities that require the physician or other qualified health care professional and does not include time in activities normally performed by clinical staff). Physician's time may include the following activities when performed: Preparing to see the patient (e.g.,  pre-charting review of records, searching for previously ordered imaging, lab work, and nerve conduction tests) Review of prior analgesic pharmacotherapies. Reviewing PMP Interpreting ordered tests (e.g., lab work, imaging, nerve conduction tests) Performing post-procedure evaluations, including interpretation of diagnostic procedures Obtaining and/or reviewing separately obtained history Performing a medically appropriate examination and/or evaluation Counseling and educating the patient/family/caregiver Ordering medications, tests, or procedures Referring and communicating with other health care professionals (when not separately reported) Documenting clinical information in the electronic or other health record Independently interpreting results (not separately reported) and communicating results to the patient/ family/caregiver Care coordination (not separately reported)  Note by: Karen Lewis Tanya, MD (TTS and AI technology used. I apologize for any typographical errors that were not detected and corrected.) Date: 03/12/2024; Time: 2:36 PM

## 2024-03-12 NOTE — Progress Notes (Signed)
 Safety precautions to be maintained throughout the outpatient stay will include: orient to surroundings, keep bed in low position, maintain call bell within reach at all times, provide assistance with transfer out of bed and ambulation.

## 2024-03-13 ENCOUNTER — Encounter: Payer: Self-pay | Admitting: Oncology

## 2024-03-14 ENCOUNTER — Telehealth: Payer: Self-pay

## 2024-03-14 NOTE — Telephone Encounter (Signed)
 Her new insurance is requiring prior auth for her oxycode and she is almost out. Please take care of this asap.

## 2024-03-14 NOTE — Telephone Encounter (Signed)
PA done and patient notified.  

## 2024-03-15 ENCOUNTER — Encounter: Payer: Self-pay | Admitting: Pain Medicine

## 2024-03-15 ENCOUNTER — Ambulatory Visit (HOSPITAL_BASED_OUTPATIENT_CLINIC_OR_DEPARTMENT_OTHER): Admitting: Pain Medicine

## 2024-03-15 ENCOUNTER — Ambulatory Visit
Admission: RE | Admit: 2024-03-15 | Discharge: 2024-03-15 | Disposition: A | Discharge status: Home / Self Care | Source: Ambulatory Visit | Attending: Pain Medicine | Admitting: Pain Medicine

## 2024-03-15 VITALS — BP 118/82 | HR 110 | Temp 97.2°F | Resp 18 | Ht 63.0 in | Wt 339.0 lb

## 2024-03-15 DIAGNOSIS — R936 Abnormal findings on diagnostic imaging of limbs: Secondary | ICD-10-CM | POA: Diagnosis present

## 2024-03-15 DIAGNOSIS — M25512 Pain in left shoulder: Secondary | ICD-10-CM

## 2024-03-15 DIAGNOSIS — G8929 Other chronic pain: Secondary | ICD-10-CM | POA: Diagnosis present

## 2024-03-15 DIAGNOSIS — M19012 Primary osteoarthritis, left shoulder: Secondary | ICD-10-CM | POA: Insufficient documentation

## 2024-03-15 MED ORDER — METHYLPREDNISOLONE ACETATE 80 MG/ML IJ SUSP
80.0000 mg | Freq: Once | INTRAMUSCULAR | Status: AC
  Administered 2024-03-15: 80 mg via INTRA_ARTICULAR

## 2024-03-15 MED ORDER — ROPIVACAINE HCL 2 MG/ML IJ SOLN
9.0000 mL | Freq: Once | INTRAMUSCULAR | Status: AC
  Administered 2024-03-15: 9 mL via INTRA_ARTICULAR

## 2024-03-15 MED ORDER — FENTANYL CITRATE (PF) 100 MCG/2ML IJ SOLN
25.0000 ug | INTRAMUSCULAR | Status: DC | PRN
  Administered 2024-03-15: 50 ug via INTRAVENOUS

## 2024-03-15 MED ORDER — MIDAZOLAM HCL 5 MG/5ML IJ SOLN
0.5000 mg | Freq: Once | INTRAMUSCULAR | Status: AC
  Administered 2024-03-15: 2 mg via INTRAVENOUS

## 2024-03-15 MED ORDER — LIDOCAINE HCL 2 % IJ SOLN
INTRAMUSCULAR | Status: AC
  Filled 2024-03-15: qty 20

## 2024-03-15 MED ORDER — METHYLPREDNISOLONE ACETATE 80 MG/ML IJ SUSP
INTRAMUSCULAR | Status: AC
Start: 2024-03-15 — End: 2024-03-15
  Filled 2024-03-15: qty 1

## 2024-03-15 MED ORDER — PENTAFLUOROPROP-TETRAFLUOROETH EX AERO
INHALATION_SPRAY | Freq: Once | CUTANEOUS | Status: AC
  Administered 2024-03-15: 30 via TOPICAL

## 2024-03-15 MED ORDER — ROPIVACAINE HCL 2 MG/ML IJ SOLN
INTRAMUSCULAR | Status: AC
  Filled 2024-03-15: qty 20

## 2024-03-15 MED ORDER — MIDAZOLAM HCL 5 MG/5ML IJ SOLN
INTRAMUSCULAR | Status: AC
  Filled 2024-03-15: qty 5

## 2024-03-15 MED ORDER — FENTANYL CITRATE (PF) 100 MCG/2ML IJ SOLN
INTRAMUSCULAR | Status: AC
Start: 2024-03-15 — End: 2024-03-15
  Filled 2024-03-15: qty 2

## 2024-03-15 MED ORDER — LIDOCAINE HCL 2 % IJ SOLN
20.0000 mL | Freq: Once | INTRAMUSCULAR | Status: AC
  Administered 2024-03-15: 400 mg

## 2024-03-15 NOTE — Patient Instructions (Signed)

## 2024-03-15 NOTE — Progress Notes (Signed)
 PROVIDER NOTE: Interpretation of information contained herein should be left to medically-trained personnel. Specific patient instructions are provided elsewhere under Patient Instructions section of medical record. This document was created in part using STT-dictation technology, any transcriptional errors that may result from this process are unintentional.  Patient: Karen Lewis Type: Established DOB: 09-04-1971 MRN: [id number] PCP: [name], MD  Service: Procedure DOS: 03/15/2024 Setting: Ambulatory Location: Ambulatory outpatient facility Delivery: Face-to-face Provider: Eric DELENA Como, MD Specialty: Interventional Pain Management Specialty designation: 09 Location: Outpatient facility Ref. Prov.: [name], MD       Interventional Therapy   Procedure: Glenohumeral and acromioclavicular joint Injection #2  Laterality: Left (-LT)  Level: Shoulder  Target: Glenohumeral and acromioclavicular joint Location: Intra-articular  Region: Entire Shoulder Area Approach: Anterior approach. Type of procedure: Percutaneous joint injection   Imaging: Fluoroscopy-guided Non-spinal ([id number]) Anesthesia: Local anesthesia (1-2% Lidocaine ) Anxiolysis: IV Versed  2.0 mg Sedation: Moderate Sedation Fentanyl  1 mL (50 mcg) DOS: 03/15/2024  Performed by: Eric DELENA Como, MD  Purpose: Diagnostic/Therapeutic Indications: Shoulder pain severe enough to impact quality of life or function. Rationale (medical necessity): procedure needed and proper for the diagnosis and/or treatment of Ms. Quintela's medical symptoms and needs. 1. Chronic shoulder pain (Left)   2. Osteoarthritis of AC (acromioclavicular) joint (Left)   3. Osteoarthritis of glenohumeral joint (Left)   4. Osteoarthritis of shoulder (Left)   5. Abnormal MRI, shoulder (02/09/2024) (Left)    NAS-11 Pain score:   Pre-procedure: 5 /10   Post-procedure: 2 /10       Position  Prep  Materials:  Position:  Supine Prep solution: ChloraPrep (2% chlorhexidine  gluconate and 70% isopropyl alcohol) Prep Area: Entire shoulder Area Materials:  Tray: Block Needle(s):  Type: Spinal  Gauge (G): 22  Length: 3.5-in  Qty: 1  H&P (Pre-op Assessment):  Ms. Lansdale is a 53 y.o. (year old), female patient, seen today for interventional treatment. She  has a past surgical history that includes Laparoscopic partial gastrectomy; Shoulder arthroscopy (Right); Carpal tunnel release (Bilateral); Diagnostic laparoscopy; Cholecystectomy; Trigger finger release (Right); Thyroidectomy (N/A, 11/12/2015); left trigger finger; Roux-en-Y Gastric Bypass (06/03/2017); Hiatal hernia repair; peniculectomy (N/A, 07/05/2018); Total hip arthroplasty (Right, 11/27/2019); Joint replacement (Bilateral, hip); Appendectomy; Trigger finger release (Right, 04/24/2020); Colonoscopy with propofol  (N/A, 02/11/2021); and Esophagogastroduodenoscopy (egd) with propofol  (N/A, 02/11/2021). Ms. Hurston has a current medication list which includes the following prescription(s): accu-chek aviva plus, acetaminophen , acidophilus, albuterol , alcohol prep, amitriptyline , biotin , butalbital -acetaminophen -caffeine , calcium  carbonate, clonazepam , cyanocobalamin , diclofenac  sodium, diphenhydramine , duloxetine, dupilumab, epinephrine , ergocalciferol , estradiol, famotidine , emgality, accu-chek aviva plus, hydrochlorothiazide, levocetirizine, levothyroxine , lisinopril , magnesium  oxide, meclizine , metformin , metoprolol  tartrate, montelukast , tab-a-vite, naloxone , naphazoline-pheniramine, nitrofurantoin  (macrocrystal-monohydrate), nystatin , omeprazole, oxycodone , oxycodone , oxycodone , ozempic (0.25 or 0.5 mg/dose), phentermine, pregabalin , quetiapine , rizatriptan, rizatriptan, rosuvastatin , tizanidine , and topiramate , and the following Facility-Administered Medications: fentanyl . Her primarily concern today is the Shoulder Pain (left)  Initial Vital Signs:  Pulse/HCG Rate: (!) 110ECG  Heart Rate: (!) 105 Temp: 97.7 F (36.5 C) Resp: 18 BP: (!) 124/96 SpO2: 99 %  BMI: Estimated body mass index is 60.05 kg/m as calculated from the following:   Height as of this encounter: 5' 3 (1.6 m).   Weight as of this encounter: 339 lb (153.8 kg).  Risk Assessment: Allergies: Reviewed. She is allergic to bactrim [sulfamethoxazole-trimethoprim], omalizumab, ciprofloxacin, shellfish allergy, and nsaids.  Allergy Precautions: None required Coagulopathies: Reviewed. None identified.  Blood-thinner therapy: None at this time Active Infection(s): Reviewed. None identified. Ms. Leinbach is afebrile  Site Confirmation: Ms. Zingaro was  asked to confirm the procedure and laterality before marking the site Procedure checklist: Completed Consent: Before the procedure and under the influence of no sedative(s), amnesic(s), or anxiolytics, the patient was informed of the treatment options, risks and possible complications. To fulfill our ethical and legal obligations, as recommended by the American Medical Association's Code of Ethics, I have informed the patient of my clinical impression; the nature and purpose of the treatment or procedure; the risks, benefits, and possible complications of the intervention; the alternatives, including doing nothing; the risk(s) and benefit(s) of the alternative treatment(s) or procedure(s); and the risk(s) and benefit(s) of doing nothing. The patient was provided information about the general risks and possible complications associated with the procedure. These may include, but are not limited to: failure to achieve desired goals, infection, bleeding, organ or nerve damage, allergic reactions, paralysis, and death. In addition, the patient was informed of those risks and complications associated to the procedure, such as failure to decrease pain; infection; bleeding; organ or nerve damage with subsequent damage to sensory, motor, and/or autonomic systems, resulting in  permanent pain, numbness, and/or weakness of one or several areas of the body; allergic reactions; (i.e.: anaphylactic reaction); and/or death. Furthermore, the patient was informed of those risks and complications associated with the medications. These include, but are not limited to: allergic reactions (i.e.: anaphylactic or anaphylactoid reaction(s)); adrenal axis suppression; blood sugar elevation that in diabetics may result in ketoacidosis or comma; water retention that in patients with history of congestive heart failure may result in shortness of breath, pulmonary edema, and decompensation with resultant heart failure; weight gain; swelling or edema; medication-induced neural toxicity; particulate matter embolism and blood vessel occlusion with resultant organ, and/or nervous system infarction; and/or aseptic necrosis of one or more joints. Finally, the patient was informed that Medicine is not an exact science; therefore, there is also the possibility of unforeseen or unpredictable risks and/or possible complications that may result in a catastrophic outcome. The patient indicated having understood very clearly. We have given the patient no guarantees and we have made no promises. Enough time was given to the patient to ask questions, all of which were answered to the patient's satisfaction. Ms. Blondin has indicated that she wanted to continue with the procedure. Attestation: I, the ordering provider, attest that I have discussed with the patient the benefits, risks, side-effects, alternatives, likelihood of achieving goals, and potential problems during recovery for the procedure that I have provided informed consent. Date  Time: 03/15/2024 10:46 AM   Imaging Guidance (Non-Spinal):          Type of Imaging Technique: Fluoroscopy Guidance (Non-Spinal) Indication(s): Fluoroscopy guidance for needle placement to enhance accuracy in procedures requiring precise needle localization for targeted delivery of  medication in or near specific anatomical locations not easily accessible without such real-time imaging assistance. Exposure Time: Please see nurses notes. Contrast: None used. Fluoroscopic Guidance: I was personally present during the use of fluoroscopy. Tunnel Vision Technique used to obtain the best possible view of the target area. Parallax error corrected before commencing the procedure. Direction-depth-direction technique used to introduce the needle under continuous pulsed fluoroscopy. Once target was reached, antero-posterior, oblique, and lateral fluoroscopic projection used confirm needle placement in all planes. Images permanently stored in EMR. Interpretation: No contrast injected. I personally interpreted the imaging intraoperatively. Adequate needle placement confirmed in multiple planes. Permanent images saved into the patient's record.  Pre-Procedure Preparation:  Monitoring: As per clinic protocol. Respiration, ETCO2, SpO2, BP, heart rate and  rhythm monitor placed and checked for adequate function Safety Precautions: Patient was assessed for positional comfort and pressure points before starting the procedure. Time-out: I initiated and conducted the Time-out before starting the procedure, as per protocol. The patient was asked to participate by confirming the accuracy of the Time Out information. Verification of the correct person, site, and procedure were performed and confirmed by me, the nursing staff, and the patient. Time-out conducted as per Joint Commission's Universal Protocol (UP.01.01.01). Time: 1128 Start Time: 1128 hrs.  Description  Narrative of Procedure:          Rationale (medical necessity): procedure needed and proper for the diagnosis and/or treatment of the patient's medical symptoms and needs. Procedural Technique Safety Precautions: Aspiration looking for blood return was conducted prior to all injections. At no point did we inject any substances, as  a needle was being advanced. No attempts were made at seeking any paresthesias. Safe injection practices and needle disposal techniques used. Medications properly checked for expiration dates. SDV (single dose vial) medications used. Description of the Procedure: Protocol guidelines were followed. The patient was placed in position over the procedure table. The target area was identified and the area prepped in the usual manner. Skin & deeper tissues infiltrated with local anesthetic. Appropriate amount of time allowed to pass for local anesthetics to take effect. The procedure needles were then advanced to the target area. Proper needle placement secured. Negative aspiration confirmed. Solution injected in intermittent fashion, asking for systemic symptoms every 0.5cc of injectate. The needles were then removed and the area cleansed, making sure to leave some of the prepping solution back to take advantage of its long term bactericidal properties.             Vitals:   03/15/24 1135 03/15/24 1140 03/15/24 1152 03/15/24 1200  BP: 101/87 116/83 119/79 118/82  Pulse:      Resp: 11 20 18 18   Temp:  (!) 97.1 F (36.2 C)  (!) 97.2 F (36.2 C)  TempSrc:      SpO2: 90% 100% 99% 100%  Weight:      Height:         Start Time: 1128 hrs. End Time: 1132 hrs.  Antibiotic Prophylaxis:   Anti-infectives (From admission, onward)    None      Indication(s): None identified  Post-operative Assessment:  Post-procedure Vital Signs:  Pulse/HCG Rate: (!) 110(!) 101 Temp: (!) 97.2 F (36.2 C) Resp: 18 BP: 118/82 SpO2: 100 %  EBL: None  Complications: No immediate post-treatment complications observed by team, or reported by patient.  Note: The patient tolerated the entire procedure well. A repeat set of vitals were taken after the procedure and the patient was kept under observation following institutional policy, for this type of procedure. Post-procedural neurological assessment was  performed, showing return to baseline, prior to discharge. The patient was provided with post-procedure discharge instructions, including a section on how to identify potential problems. Should any problems arise concerning this procedure, the patient was given instructions to immediately contact us , at any time, without hesitation. In any case, we plan to contact the patient by telephone for a follow-up status report regarding this interventional procedure.  Comments:  No additional relevant information.  Plan of Care (POC)  Orders:  Orders Placed This Encounter  Procedures   SHOULDER INJECTION    Scheduling Instructions:     Procedure: Intra-articular shoulder (Glenohumeral) joint and (AC) Acromioclavicular joint injection     Side: Left-sided  Level: Glenohumeral joint and (AC) Acromioclavicular joint     Sedation: Patient's choice.     Date: 03/15/2024    Where will this procedure be performed?:   [facility]   [facility] C-ARM 1-60 MIN NO REPORT    Intraoperative interpretation by procedural physician at [facility].    Standing Status:   Standing    Number of Occurrences:   1    Reason for exam::   Assistance in needle guidance and placement for procedures requiring needle placement in or near specific anatomical locations not easily accessible without such assistance.   Informed Consent Details: Physician/Practitioner Attestation; Transcribe to consent form and obtain patient signature    Note: Always confirm laterality of pain with Ms. Loreli, before procedure.    Physician/Practitioner attestation of informed consent for procedure/surgical case:   I, the physician/practitioner, attest that I have discussed with the patient the benefits, risks, side effects, alternatives, likelihood of achieving goals and potential problems during recovery for the procedure that I have provided informed consent.    Procedure:   Intra-articular shoulder joint injection under  fluoroscopic guidance    Physician/Practitioner performing the procedure:   Mitchelle Sultan A. Tanya, MD    Indication/Reason:   Chronic shoulder pain secondary to shoulder arthropathy   Provide equipment / supplies at bedside    Procedure tray: Block Tray (Disposable  single use) Skin infiltration needle: Regular 1.5-in, 25-G, (x1) Block Needle type: Spinal Amount/quantity: 1 Size: Regular (3.5-inch) Gauge: 22G    Standing Status:   Standing    Number of Occurrences:   1    Specify:   Block Tray   Saline lock IV    Have LR [phone] mL available and administer at 125 mL/hr if patient becomes hypotensive.    Standing Status:   Standing    Number of Occurrences:   1    Chronic Opioid Analgesic:  Oxycodone  IR 5 mg 1 tab PO TID (#90/mo) (15 mg/day) MME/day: 22.5 mg/day.    Medications ordered for procedure: Meds ordered this encounter  Medications   lidocaine  (XYLOCAINE ) 2 % (with pres) injection 400 mg   pentafluoroprop-tetrafluoroeth (GEBAUERS) aerosol   midazolam  (VERSED ) 5 MG/5ML injection 0.5-2 mg    Make sure Flumazenil is available in the pyxis when using this medication. If oversedation occurs, administer 0.2 mg IV over 15 sec. If after 45 sec no response, administer 0.2 mg again over 1 min; may repeat at 1 min intervals; not to exceed 4 doses (1 mg)   fentaNYL  (SUBLIMAZE ) injection 25-50 mcg    Make sure Narcan  is available in the pyxis when using this medication. In the event of respiratory depression (RR< 8/min): Titrate NARCAN  (naloxone ) in increments of 0.1 to 0.2 mg IV at 2-3 minute intervals, until desired degree of reversal.   ropivacaine  (PF) 2 mg/mL (0.2%) (NAROPIN ) injection 9 mL   methylPREDNISolone  acetate (DEPO-MEDROL ) injection 80 mg   Medications administered: We administered lidocaine , pentafluoroprop-tetrafluoroeth, midazolam , fentaNYL , ropivacaine  (PF) 2 mg/mL (0.2%), and methylPREDNISolone  acetate.  See the medical record for exact dosing, route, and time  of administration.    Interventional Therapies  Risk  Complexity Considerations:   Estimated body mass index is 60.59 kg/m as calculated from the following:   Height as of 06/18/21: 5' 4 (1.626 m).   Weight as of 06/18/21: 353 lb (160.1 kg).     NOTE: NO RFA until BMI less or equal to 35.  (Gastric bypass done on 06/03/2017) Iodine allergy  Contrast dye allergy  Shellfish allergy    Planned  Pending:   Diagnostic/therapeutic left IA shoulder (glenohumeral + AC) joint inj. #2 (first of 2025)    Under consideration:   Diagnostic/therapeutic left IA shoulder (glenohumeral + AC) joint inj. #2 (first of 2025)    Completed:   Therapeutic right L2-3 LESI x1 (04/29/2022) (100/100/60/60)  Therapeutic bilateral IA Monovisc knee inj. x1 (05/25/2022)  Therapeutic right trapezius TPI/MNB x1 (05/25/2022)  Therapeutic bilateral IA Zilretta  knee inj. x4 (03/25/2022) (100/100/80/80)  Therapeutic bilateral IA Hyalgan knee inj. x16 (06/24/2020) (100/100/60/60)  Palliative/therapeutic bilateral lumbar facet MBB x9 (07/07/2023) (100/100/70/70)  Therapeutic right IA hip injection x3 (07/07/2017) (100/50/25/>50)  Therapeutic left IA shoulder (glenohumeral joint) injection x1 (05/31/2017) (50/0/100/100)  (07/28/2023) referral to interventional pain management practice for bilateral lumbar facet RFA    Completed by other providers:   Therapeutic bilateral THR (total hip replacements) (Dr. Kathlynn) (Right: 11/27/2019) (Left: 07/29/2015)    Therapeutic  Palliative (PRN) options:   Palliative bilateral knee injections  Palliative bilateral lumbar facet MBBs    Pharmacotherapy  Nonopioid transferred 06/24/2020: Lyrica        Follow-up plan:   Return in about 2 weeks (around 03/29/2024) for (Face2F), (PPE).     Recent Visits Date Type Provider Dept  03/12/24 Office Visit Tanya Glisson, MD [facility]  02/07/24 Procedure visit Tanya Glisson, MD [facility]  01/18/24 Office  Visit Tanya Glisson, MD [facility]  12/26/23 Office Visit Tanya Glisson, MD [facility]  Showing recent visits within past 90 days and meeting all other requirements Today's Visits Date Type Provider Dept  03/15/24 Procedure visit Tanya Glisson, MD [facility]  Showing today's visits and meeting all other requirements Future Appointments Date Type Provider Dept  04/02/24 Appointment Patel, Seema K, NP [facility]  04/03/24 Appointment Tanya Glisson, MD [facility]  Showing future appointments within next 90 days and meeting all other requirements   Disposition: Discharge home  Discharge (Date  Time): 03/15/2024; 1206 hrs.   Primary Care Physician: [name], MD Location: [facility] Note by: Glisson DELENA Tanya, MD (TTS technology used. I apologize for any typographical errors that were not detected and corrected.) Date: 03/15/2024; Time: 12:09 PM  Disclaimer:  Medicine is not an Visual merchandiser. The only guarantee in medicine is that nothing is guaranteed. It is important to note that the decision to proceed with this intervention was based on the information collected from the patient. The Data and conclusions were drawn from the patient's questionnaire, the interview, and the physical examination. Because the information was provided in large part by the patient, it cannot be guaranteed that it has not been purposely or unconsciously manipulated. Every effort has been made to obtain as much relevant data as possible for this evaluation. It is important to note that the conclusions that lead to this procedure are derived in large part from the available data. Always take into account that the treatment will also be dependent on availability of resources and existing treatment guidelines, considered by other Pain Management Practitioners as being common knowledge and practice, at the time of  the intervention. For Medico-Legal purposes, it is also important to point out that variation in procedural techniques and pharmacological choices are the acceptable norm. The indications, contraindications, technique, and results of the above procedure should only be interpreted and judged by a Board-Certified Interventional Pain Specialist with extensive familiarity and expertise in the same exact procedure  and technique.

## 2024-04-02 ENCOUNTER — Encounter: Admitting: Nurse Practitioner

## 2024-04-02 NOTE — Progress Notes (Signed)
 PROVIDER NOTE: Interpretation of information contained herein should be left to medically-trained personnel. Specific patient instructions are provided elsewhere under Patient Instructions section of medical record. This document was created in part using AI and STT-dictation technology, any transcriptional errors that may result from this process are unintentional.  Patient: Karen Lewis  Service: E/M   PCP: Karen Reyes BIRCH, MD  DOB: September 24, 1970  DOS: 04/03/2024  Provider: Eric DELENA Como, MD  MRN: [id number]  Delivery: Face-to-face  Specialty: Interventional Pain Management  Type: Established Patient  Setting: Ambulatory outpatient facility  Specialty designation: 09  Referring Prov.: Karen Reyes BIRCH, MD  Location: Outpatient office facility       History of present illness (HPI) [name], a 53 y.o. year old female, is here today because of her Chronic left shoulder pain [M25.512, G89.29]. [name] primary complain today is Shoulder Pain (left)  Pertinent problems: [name] has Fibromyalgia; Chronic knee pain (1ry area of Pain) (Bilateral) (R>L); Chronic low back pain (3ry area of Pain) (Bilateral) (R>L) w/o sciatica; Lumbar facet syndrome (Bilateral) (R>L); Secondary osteoarthritis of multiple sites; Grade 1  Anterolisthesis of L3/L4 & L4/L5 (1.4 cm); Chronic hip pain s/p THR (2ry area of pain) (Bilateral); S/P THR (total hip replacement) (Left); Lumbar spondylosis; Chronic pain syndrome; Neurogenic pain; Upper extremity pain; Chronic shoulder pain (Left); Osteoarthritis of shoulder (Left); Osteoarthritis of hip (Right); Secondary Osteoarthritis of knee (Bilateral) (R>L); Spondylosis without myelopathy or radiculopathy, lumbosacral region; Panniculitis; Leg swelling; Edema of both legs; History of total hip replacement (Bilateral); S/P THR (total hip replacement) (Right) (11/27/2019); Pain and numbness of left upper extremity; Cervical radiculitis (Left); Aftercare following joint  replacement surgery; Bilateral primary osteoarthritis of knee; Osteoarthritis of lumbar spine without myelopathy or radiculopathy; Lumbosacral radiculopathy at S1 (Left); Tricompartment osteoarthritis of knees (Bilateral); Lumbosacral radiculopathy at L2 (Right); Abnormal MRI, lumbar spine (07/01/2022); Chronic thigh pain (Right); Weakness of leg (Right); Lumbar interspinous bursitis (L2-L5); Spinal enthesopathy of lumbar region [facility]); Lumbar facet arthropathy (Multilevel) (Bilateral); Lumbar central spinal stenosis, w/o claudication (L2-3, L4-5, L5-S1); Lumbar foraminal stenosis (Bilateral: L2-3, L4-5) (L>R); Lumbosacral lateral recess stenosis (Bilateral: L4-5, L5-S1); Trigger point of right shoulder region; Presence of artificial hip joint, bilateral; Lumbar facet joint pain; DDD (degenerative disc disease), lumbosacral; Cervical radiculopathy at C6; Cervicalgia; DDD (degenerative disc disease), cervical; Abnormal MRI, cervical spine (01/17/2024); Abnormal MRI, shoulder (02/09/2024) (Left); Osteoarthritis of glenohumeral joint (Left); and Osteoarthritis of AC (acromioclavicular) joint (Left) on their pertinent problem list.  Pain Assessment: Severity of Chronic pain is reported as a 2 /10. Location: Shoulder Left/left chest and left are. Onset: More than a month ago. Quality: Aching. Timing: Intermittent. Modifying factor(s): ice, heat, rest. Vitals:  weight is 337 lb (152.9 kg) (abnormal). Her temporal temperature is 97.4 F (36.3 C) (abnormal). Her blood pressure is 141/78 (abnormal) and her pulse is 94. Her respiration is 18 and oxygen saturation is 99%.  BMI: Estimated body mass index is 59.7 kg/m as calculated from the following:   Height as of 03/15/24: 5' 3 (1.6 m).   Weight as of this encounter: 337 lb (152.9 kg).  Last encounter: 03/12/2024. Last procedure: 03/15/2024.  Reason for encounter: post-procedure evaluation and assessment.   Discussed the use of AI scribe software for clinical note  transcription with the patient, who gave verbal consent to proceed.  History of Present Illness   Karen Lewis is a 53 year old female who presents for follow-up after a shoulder injection.  She experienced significant improvement  in her left shoulder pain following a previous injection into the glenohumeral and acromioclavicular joints. The pain relief was immediate while the area was numb, and she currently feels about 75% better. Prior to the injection, physical therapy was too painful to continue, but now she feels capable of resuming therapy to strengthen the supporting muscles around the shoulder.  She also has bursitis in her hip and a trigger finger, which she plans to address with her healthcare provider.      Post-Procedure Evaluation   Procedure: Glenohumeral and acromioclavicular joint Injection #2  Laterality: Left (-LT)  Level: Shoulder  Target: Glenohumeral and acromioclavicular joint Location: Intra-articular  Region: Entire Shoulder Area Approach: Anterior approach. Type of procedure: Percutaneous joint injection   Imaging: Fluoroscopy-guided Non-spinal ([id number]) Anesthesia: Local anesthesia (1-2% Lidocaine ) Anxiolysis: IV Versed  2.0 mg Sedation: Moderate Sedation Fentanyl  1 mL (50 mcg) DOS: 03/15/2024  Performed by: Karen DELENA Como, MD  Purpose: Diagnostic/Therapeutic Indications: Shoulder pain severe enough to impact quality of life or function. Rationale (medical necessity): procedure needed and proper for the diagnosis and/or treatment of Karen Lewis's medical symptoms and needs. 1. Chronic shoulder pain (Left)   2. Osteoarthritis of AC (acromioclavicular) joint (Left)   3. Osteoarthritis of glenohumeral joint (Left)   4. Osteoarthritis of shoulder (Left)   5. Abnormal MRI, shoulder (02/09/2024) (Left)    NAS-11 Pain score:   Pre-procedure: 5 /10   Post-procedure: 2 /10       Effectiveness:  Initial hour after procedure: 100 %. Subsequent 4-6 hours  post-procedure: 100 %. Analgesia past initial 6 hours: 75 %. Ongoing improvement:  Analgesic: The patient indicated having attained 100% relief of the pain for evaluation of local anesthetic followed by an ongoing 75% improvement. Function: Karen Lewis reports improvement in function ROM: Karen Lewis reports improvement in ROM  Pharmacotherapy Assessment   Chronic Opioid Analgesic:  Oxycodone  IR 5 mg 1 tab PO TID (#90/mo) (15 mg/day) MME/day: 22.5 mg/day.   Monitoring: [place] PMP: PDMP reviewed during this encounter.       Pharmacotherapy: No side-effects or adverse reactions reported. Compliance: No problems identified. Effectiveness: Clinically acceptable.  Karen Montie FALCON, RN  04/03/2024  2:04 PM  Sign when Signing Visit Safety precautions to be maintained throughout the outpatient stay will include: orient to surroundings, keep bed in low position, maintain call bell within reach at all times, provide assistance with transfer out of bed and ambulation.     UDS:  Summary  Date Value Ref Range Status  06/08/2023 Note  Final    Comment:    ==================================================================== [employer] (MW) ==================================================================== Test                             Result       Flag       Units  Drug Present and Declared for Prescription Verification   7-aminoclonazepam              68           EXPECTED   ng/mg creat    7-aminoclonazepam is an expected metabolite of clonazepam . Source of    clonazepam  is a scheduled prescription medication.    Oxycodone                       416          EXPECTED   ng/mg creat   Oxymorphone  108          EXPECTED   ng/mg creat   Noroxycodone                   1904         EXPECTED   ng/mg creat   Noroxymorphone                 45           EXPECTED   ng/mg creat    Sources of oxycodone  are scheduled prescription medications.    Oxymorphone, noroxycodone, and  noroxymorphone are expected    metabolites of oxycodone . Oxymorphone is also available as a    scheduled prescription medication.    Butalbital                      PRESENT      EXPECTED ==================================================================== Test                      Result    Flag   Units      Ref Range   Creatinine              262              mg/dL      >=79 ==================================================================== Declared Medications:  The flagging and interpretation on this report are based on the  following declared medications.  Unexpected results may arise from  inaccuracies in the declared medications.   **Note: The testing scope of this panel includes these medications:   Butalbital  (Fioricet )  Clonazepam  (Klonopin )  Oxycodone    **Note: The testing scope of this panel does not include the  following reported medications:   Acetaminophen   Acetaminophen  (Fioricet )  Albuterol  (Ventolin  HFA)  Amitriptyline  (Elavil )  Biotin   Caffeine  (Fioricet )  Calcium  (Tums)  Diphenhydramine  (Benadryl )  Duloxetine (Cymbalta)  Epinephrine  (EpiPen )  Famotidine  (Pepcid )  Fluticasone  (Breo)  Galcanezumab (Emgality)  Indeterminate Medication  Levocetirizine (Xyzal )  Levothyroxine  (Synthroid )  Lisinopril  (Zestril )  Magnesium  (Mag-Ox)  Meclizine  (Antivert )  Metformin   Metoprolol  (Lopressor )  Montelukast  (Singulair )  Multivitamin  Naloxone  (Narcan )  Nitrofurantoin  (Macrodantin )  Omeprazole (Prilosec)  Phentermine (Adipex)  Pregabalin  (Lyrica )  Rizatriptan  Rosuvastatin  (Crestor )  Semaglutide (Ozempic)  Tizanidine  (Zanaflex )  Topical Diclofenac  (Voltaren )  Topiramate  (Topamax )  Vilanterol (Breo)  Vitamin D2 ==================================================================== For clinical consultation, please call [phone]. ====================================================================     No results found for: CBDTHCR No results  found for: D8THCCBX No results found for: D9THCCBX  ROS  Constitutional: Denies any fever or chills Gastrointestinal: No reported hemesis, hematochezia, vomiting, or acute GI distress Musculoskeletal: Denies any acute onset joint swelling, redness, loss of ROM, or weakness Neurological: No reported episodes of acute onset apraxia, aphasia, dysarthria, agnosia, amnesia, paralysis, loss of coordination, or loss of consciousness  Medication Review  Alcohol Prep, Biotin , DULoxetine, Dupilumab, EPINEPHrine , Galcanezumab-gnlm, QUEtiapine , Semaglutide(0.25 or 0.5MG /DOS), Tab-A-Vite, acetaminophen , acidophilus, albuterol , amitriptyline , butalbital -acetaminophen -caffeine , calcium  carbonate, clonazePAM , cyanocobalamin , diclofenac  sodium, diphenhydrAMINE , ergocalciferol , estradiol, famotidine , glucose blood, hydrochlorothiazide, levocetirizine, levothyroxine , lisinopril , magnesium  oxide, meclizine , metFORMIN , metoprolol  tartrate, montelukast , naloxone , naphazoline-pheniramine, nitrofurantoin  (macrocrystal-monohydrate), nystatin , omeprazole, oxyCODONE , phentermine, pregabalin , rizatriptan, rosuvastatin , tiZANidine , and topiramate   History Review  Allergy: Karen Lewis is allergic to bactrim [sulfamethoxazole-trimethoprim], omalizumab, ciprofloxacin, shellfish allergy, and nsaids. Drug: Karen Lewis  reports no history of drug use. Alcohol:  reports no history of alcohol use. Tobacco:  reports that she has never smoked. She has never used smokeless tobacco. Social: Karen Lewis  reports that she  has never smoked. She has never used smokeless tobacco. She reports that she does not drink alcohol and does not use drugs. Medical:  has a past medical history of Anemia, Anginal pain (HCC), Anxiety, Arthralgia of hip (07/29/2015), Arthritis, Arthritis, degenerative (07/29/2015), Asthma, Cephalalgia (07/25/2014), Dependence on unknown drug (HCC), Depression, Diabetes mellitus without complication (HCC), Difficult intubation,  Dysrhythmia, Eczema, Fibromyalgia, Gastritis, GERD (gastroesophageal reflux disease), Gonalgia (07/29/2015), Gout, H/O cardiovascular disorder (03/10/2015), H/O surgical procedure (12/05/2012), H/O thyroid  disease (03/10/2015), Headache, Herpes, History of artificial joint (07/29/2015), History of hiatal hernia, Hypertension, Hypomagnesemia, Hypothyroidism, IDA (iron  deficiency anemia) (05/28/2019), LBP (low back pain) (07/29/2015), Neuromuscular disorder (HCC), Obesity, PCOS (polycystic ovarian syndrome), Primary osteoarthritis of both knees (07/29/2015), Sleep apnea, Thyroid  nodule (bilateral), and Umbilical hernia. Surgical: Karen Lewis  has a past surgical history that includes Laparoscopic partial gastrectomy; Shoulder arthroscopy (Right); Carpal tunnel release (Bilateral); Diagnostic laparoscopy; Cholecystectomy; Trigger finger release (Right); Thyroidectomy (N/A, 11/12/2015); left trigger finger; Roux-en-Y Gastric Bypass (06/03/2017); Hiatal hernia repair; peniculectomy (N/A, 07/05/2018); Total hip arthroplasty (Right, 11/27/2019); Joint replacement (Bilateral, hip); Appendectomy; Trigger finger release (Right, 04/24/2020); Colonoscopy with propofol  (N/A, 02/11/2021); and Esophagogastroduodenoscopy (egd) with propofol  (N/A, 02/11/2021). Family: family history includes Alcohol abuse in her father and mother; Anxiety disorder in her father and mother; Brain cancer (age of onset: 56) in her paternal uncle; Breast cancer (age of onset: 57) in her paternal aunt and paternal aunt; COPD in her father; Depression in her brother, father, and mother; Diabetes in her brother, father, and mother; Hypertension in her brother, father, and mother; Kidney cancer in her mother; Kidney failure in her father; Post-traumatic stress disorder in her father; Sleep apnea in her brother, father, and mother.  Laboratory Chemistry Profile   Renal Lab Results  Component Value Date   BUN 17 08/09/2022   CREATININE 1.04 (H) 08/09/2022    GFRAA >60 04/16/2020   GFRNONAA >60 08/09/2022    Hepatic Lab Results  Component Value Date   AST 25 08/07/2022   ALT 40 08/07/2022   ALBUMIN 3.5 08/07/2022   ALKPHOS 74 08/07/2022   LIPASE 37 08/07/2022    Electrolytes Lab Results  Component Value Date   NA 142 08/09/2022   K 4.8 08/09/2022   CL 116 (H) 08/09/2022   CALCIUM  7.8 (L) 08/09/2022   MG 2.5 (H) 08/07/2022   PHOS 5.5 (H) 05/22/2019    Bone Lab Results  Component Value Date   VD25OH 27.4 (L) 11/24/2015   VD125OH2TOT 41.5 11/24/2015    Inflammation (CRP: Acute Phase) (ESR: Chronic Phase) Lab Results  Component Value Date   CRP 0.7 09/27/2019   ESRSEDRATE 30 (H) 09/27/2019   LATICACIDVEN 1.7 08/07/2022         Note: Above Lab results reviewed.  Recent Imaging Review  DG PAIN CLINIC C-ARM 1-60 MIN NO REPORT Fluoro was used, but no Radiologist interpretation will be provided.  Please refer to NOTES tab for provider progress note. Note: Reviewed        Physical Exam  Vitals: BP (!) 141/78 (Cuff Size: Large)   Pulse 94   Temp (!) 97.4 F (36.3 C) (Temporal)   Resp 18   Wt (!) 337 lb (152.9 kg)   SpO2 99%   BMI 59.70 kg/m  BMI: Estimated body mass index is 59.7 kg/m as calculated from the following:   Height as of 03/15/24: 5' 3 (1.6 m).   Weight as of this encounter: 337 lb (152.9 kg). Ideal: Ideal body weight: 52.4 kg (115 lb 8.3  oz) Adjusted ideal body weight: 92.6 kg (204 lb 1.8 oz) General appearance: Well nourished, well developed, and well hydrated. In no apparent acute distress Mental status: Alert, oriented x 3 (person, place, & time)       Respiratory: No evidence of acute respiratory distress Eyes: PERLA   Assessment   Diagnosis Status  1. Chronic shoulder pain (Left)   2. Osteoarthritis of AC (acromioclavicular) joint (Left)   3. Osteoarthritis of glenohumeral joint (Left)   4. Postop check    Controlled Controlled Controlled   Updated Problems: No problems updated.  Plan  of Care  Problem-specific:  Assessment and Plan    Chronic shoulder pain with swelling   Chronic pain and swelling are present in the left glenohumeral and acromioclavicular joints. A previous injection provided 75% pain relief, indicating correct targeting and swelling reduction. Pain relief during numbing suggests the pain originates in the injected area. The swelling's etiology is unclear, possibly due to repetitive activities. Long-term management may involve physical therapy and potential nerve interventions if pain persists. Fluoroscopy was used for precise needle placement, preferred over ultrasound for better visualization and ease of use. Steroid effects last up to two weeks, with long-term relief dependent on reducing swelling triggers. Refer to physical therapy to strengthen supporting muscles and prevent aggravation. Communicate with Dr. Toby regarding the positive response to the injection. Consider a suprascapular nerve block for diagnostic purposes if pain recurs. Evaluate the potential for radiofrequency ablation if the nerve block is successful. Discuss with an orthopedic surgeon regarding potential corrective surgery if pain persists.  Acne   Acne is noted, but no specific discussion or treatment plan was provided during the encounter.       Karen Lewis has a current medication list which includes the following long-term medication(s): albuterol , diphenhydramine , hydrochlorothiazide, magnesium  oxide, montelukast , naloxone , oxycodone , oxycodone , oxycodone , rizatriptan, and rizatriptan.  Pharmacotherapy (Medications Ordered): No orders of the defined types were placed in this encounter.  Orders:  Orders Placed This Encounter  Procedures   Nursing Instructions:    Please complete this patient's postprocedure evaluation.    Scheduling Instructions:     Please complete this patient's postprocedure evaluation.     Interventional Therapies  Risk  Complexity Considerations:    Estimated body mass index is 60.59 kg/m as calculated from the following:   Height as of 06/18/21: 5' 4 (1.626 m).   Weight as of 06/18/21: 353 lb (160.1 kg).     NOTE: NO RFA until BMI less or equal to 35.  (Gastric bypass done on 06/03/2017) Iodine allergy  Contrast dye allergy  Shellfish allergy    Planned  Pending:   Diagnostic/therapeutic left IA shoulder (glenohumeral + AC) joint inj. #2 (first of 2025)    Under consideration:   Diagnostic/therapeutic left IA shoulder (glenohumeral + AC) joint inj. #2 (first of 2025)    Completed:   Therapeutic right L2-3 LESI x1 (04/29/2022) (100/100/60/60)  Therapeutic bilateral IA Monovisc knee inj. x1 (05/25/2022)  Therapeutic right trapezius TPI/MNB x1 (05/25/2022)  Therapeutic bilateral IA Zilretta  knee inj. x4 (03/25/2022) (100/100/80/80)  Therapeutic bilateral IA Hyalgan knee inj. x16 (06/24/2020) (100/100/60/60)  Palliative/therapeutic bilateral lumbar facet MBB x9 (07/07/2023) (100/100/70/70)  Therapeutic right IA hip injection x3 (07/07/2017) (100/50/25/>50)  Therapeutic left IA shoulder (glenohumeral joint) injection x1 (05/31/2017) (50/0/100/100)  (07/28/2023) referral to interventional pain management practice for bilateral lumbar facet RFA    Completed by other providers:   Therapeutic bilateral THR (total hip replacements) (Dr. Kathlynn) (Right: 11/27/2019) (  Left: 07/29/2015)    Therapeutic  Palliative (PRN) options:   Palliative bilateral knee injections  Palliative bilateral lumbar facet MBBs    Pharmacotherapy  Nonopioid transferred 06/24/2020: Lyrica       Return if symptoms worsen or fail to improve.    Recent Visits Date Type Provider Dept  03/15/24 Procedure visit Tanya Glisson, MD [facility]  03/12/24 Office Visit Tanya Glisson, MD [facility]  02/07/24 Procedure visit Tanya Glisson, MD [facility]  01/18/24 Office Visit Tanya Glisson, MD [facility]   Showing recent visits within past 90 days and meeting all other requirements Today's Visits Date Type Provider Dept  04/03/24 Office Visit Tanya Glisson, MD [facility]  Showing today's visits and meeting all other requirements Future Appointments Date Type Provider Dept  04/16/24 Appointment Patel, Seema K, NP [facility]  Showing future appointments within next 90 days and meeting all other requirements  I discussed the assessment and treatment plan with the patient. The patient was provided an opportunity to ask questions and all were answered. The patient agreed with the plan and demonstrated an understanding of the instructions.  Patient advised to call back or seek an in-person evaluation if the symptoms or condition worsens.  Duration of encounter: 30 minutes.  Total time on encounter, as per AMA guidelines included both the face-to-face and non-face-to-face time personally spent by the physician and/or other qualified health care professional(s) on the day of the encounter (includes time in activities that require the physician or other qualified health care professional and does not include time in activities normally performed by clinical staff). Physician's time may include the following activities when performed: Preparing to see the patient (e.g., pre-charting review of records, searching for previously ordered imaging, lab work, and nerve conduction tests) Review of prior analgesic pharmacotherapies. Reviewing PMP Interpreting ordered tests (e.g., lab work, imaging, nerve conduction tests) Performing post-procedure evaluations, including interpretation of diagnostic procedures Obtaining and/or reviewing separately obtained history Performing a medically appropriate examination and/or evaluation Counseling and educating the patient/family/caregiver Ordering medications, tests, or procedures Referring and communicating with other health care  professionals (when not separately reported) Documenting clinical information in the electronic or other health record Independently interpreting results (not separately reported) and communicating results to the patient/ family/caregiver Care coordination (not separately reported)  Note by: Glisson DELENA Tanya, MD (TTS and AI technology used. I apologize for any typographical errors that were not detected and corrected.) Date: 04/03/2024; Time: 3:21 PM

## 2024-04-03 ENCOUNTER — Encounter: Payer: Self-pay | Admitting: Pain Medicine

## 2024-04-03 ENCOUNTER — Ambulatory Visit: Attending: Pain Medicine | Admitting: Pain Medicine

## 2024-04-03 VITALS — BP 141/78 | HR 94 | Temp 97.4°F | Resp 18 | Wt 337.0 lb

## 2024-04-03 DIAGNOSIS — Z09 Encounter for follow-up examination after completed treatment for conditions other than malignant neoplasm: Secondary | ICD-10-CM | POA: Diagnosis present

## 2024-04-03 DIAGNOSIS — M19012 Primary osteoarthritis, left shoulder: Secondary | ICD-10-CM | POA: Insufficient documentation

## 2024-04-03 DIAGNOSIS — G8929 Other chronic pain: Secondary | ICD-10-CM | POA: Insufficient documentation

## 2024-04-03 DIAGNOSIS — M25512 Pain in left shoulder: Secondary | ICD-10-CM | POA: Insufficient documentation

## 2024-04-03 NOTE — Patient Instructions (Signed)

## 2024-04-03 NOTE — Progress Notes (Signed)
 Safety precautions to be maintained throughout the outpatient stay will include: orient to surroundings, keep bed in low position, maintain call bell within reach at all times, provide assistance with transfer out of bed and ambulation.

## 2024-04-16 ENCOUNTER — Encounter: Payer: Self-pay | Admitting: Nurse Practitioner

## 2024-04-19 ENCOUNTER — Ambulatory Visit
Admission: RE | Admit: 2024-04-19 | Discharge: 2024-04-19 | Disposition: A | Discharge status: Home / Self Care | Source: Ambulatory Visit | Attending: Nurse Practitioner | Admitting: Nurse Practitioner

## 2024-04-19 ENCOUNTER — Encounter: Payer: Self-pay | Admitting: Nurse Practitioner

## 2024-04-19 ENCOUNTER — Ambulatory Visit: Payer: Self-pay | Admitting: Nurse Practitioner

## 2024-04-19 VITALS — BP 110/77 | HR 77 | Temp 97.4°F | Resp 17 | Ht 63.0 in | Wt 337.0 lb

## 2024-04-19 DIAGNOSIS — M25562 Pain in left knee: Secondary | ICD-10-CM | POA: Insufficient documentation

## 2024-04-19 DIAGNOSIS — M25551 Pain in right hip: Secondary | ICD-10-CM | POA: Insufficient documentation

## 2024-04-19 DIAGNOSIS — M25511 Pain in right shoulder: Secondary | ICD-10-CM | POA: Diagnosis present

## 2024-04-19 DIAGNOSIS — M545 Low back pain, unspecified: Secondary | ICD-10-CM | POA: Diagnosis present

## 2024-04-19 DIAGNOSIS — G8929 Other chronic pain: Secondary | ICD-10-CM | POA: Diagnosis present

## 2024-04-19 DIAGNOSIS — G894 Chronic pain syndrome: Secondary | ICD-10-CM

## 2024-04-19 DIAGNOSIS — Z79899 Other long term (current) drug therapy: Secondary | ICD-10-CM | POA: Insufficient documentation

## 2024-04-19 DIAGNOSIS — M47816 Spondylosis without myelopathy or radiculopathy, lumbar region: Secondary | ICD-10-CM | POA: Diagnosis present

## 2024-04-19 DIAGNOSIS — M25561 Pain in right knee: Secondary | ICD-10-CM

## 2024-04-19 DIAGNOSIS — M5137 Other intervertebral disc degeneration, lumbosacral region with discogenic back pain only: Secondary | ICD-10-CM

## 2024-04-19 DIAGNOSIS — M153 Secondary multiple arthritis: Secondary | ICD-10-CM | POA: Insufficient documentation

## 2024-04-19 DIAGNOSIS — Z79891 Long term (current) use of opiate analgesic: Secondary | ICD-10-CM

## 2024-04-19 MED ORDER — OXYCODONE HCL 5 MG PO TABS: 5.0000 mg | ORAL_TABLET | Freq: Three times a day (TID) | ORAL | 0 refills | Status: DC | PRN

## 2024-04-19 NOTE — Progress Notes (Signed)
 PROVIDER NOTE: Interpretation of information contained herein should be left to medically-trained personnel. Specific patient instructions are provided elsewhere under Patient Instructions section of medical record. This document was created in part using AI and STT-dictation technology, any transcriptional errors that may result from this process are unintentional.  Patient: Karen Lewis  Service: E/M   PCP: [name], MD  DOB: 1971/02/18  DOS: 04/19/2024  Provider: Emmy MARLA Blanch, NP  MRN: [id number]  Delivery: Face-to-face  Specialty: Interventional Pain Management  Type: Established Patient  Setting: Ambulatory outpatient facility  Specialty designation: 09  Referring Prov.: [name], MD  Location: Outpatient office facility       History of present illness (HPI) Karen Lewis, a 53 y.o. year old female, is here today because of her Chronic right shoulder pain [M25.511, G89.29]. Karen Lewis primary complain today is Back Pain, Knee Pain (bilat), Hip Pain (left), and Shoulder Pain (right)  Pertinent problems: Karen Lewis has  has Fibromyalgia; Chronic knee pain (1ry area of Pain) (Bilateral) (R>L); Chronic low back pain (3ry area of Pain) (Bilateral) (R>L) w/o sciatica; Lumbar facet syndrome (Bilateral) (R>L); Secondary osteoarthritis of multiple sites; Grade 1  Anterolisthesis of L3/L4 & L4/L5 (1.4 cm); Chronic hip pain s/p THR (2ry area of pain) (Bilateral); S/P THR (total hip replacement) (Left); Lumbar spondylosis; and Chronic pain syndrome on their pertinent problem list.   Pain Assessment: Severity of Chronic pain is reported as a 3 /10. Location: Back Lower, Mid/shoots down right side to knee (numbness and tingling) and to left hip. Onset: More than a month ago. Quality: Numbness, Aching, Burning, Stabbing, Dull. Timing: Constant. Modifying factor(s): procedures, ice, heat, rest, meds. Vitals:  height is 5' 3 (1.6 m) and weight is 337 lb (152.9 kg) (abnormal). Her temperature is  97.4 F (36.3 C) (abnormal). Her blood pressure is 110/77 and her pulse is 77. Her respiration is 17 and oxygen saturation is 97%.  BMI: Estimated body mass index is 59.7 kg/m as calculated from the following:   Height as of this encounter: 5' 3 (1.6 m).   Weight as of this encounter: 337 lb (152.9 kg).  Last encounter: 04/03/2024 Last procedure: 03/15/2024  Reason for encounter: medication management.  The patient indicates doing well with current medication regimen.  No side effects or adverse reaction reported to this medication.  The patient complains of chronic right shoulder pain, which intermittently affects her daily functional activities and range of motion.  We discussed ordering imaging to evaluate underlying cause of her right shoulder pain.  Pharmacotherapy Assessment   Oxycodone  (Oxy IR/oxycodone ) 5 mg immediate release every 8 hours as needed for pain. MME=22.50 Monitoring: [place] PMP: PDMP reviewed during this encounter.       Pharmacotherapy: No side-effects or adverse reactions reported. Compliance: No problems identified. Effectiveness: Clinically acceptable.  Karen Pulling, RN  04/19/2024  3:23 PM  Sign when Signing Visit Nursing Pain Medication Assessment:  Safety precautions to be maintained throughout the outpatient stay will include: orient to surroundings, keep bed in low position, maintain call bell within reach at all times, provide assistance with transfer out of bed and ambulation.  Medication Inspection Compliance: Pill count conducted under aseptic conditions, in front of the patient. Neither the pills nor the bottle was removed from the patient's sight at any time. Once count was completed pills were immediately returned to the patient in their original bottle.  Medication: Oxycodone  IR Pill/Patch Count: 40 of 90 pills/patches remain Pill/Patch Appearance: Markings  consistent with prescribed medication Bottle Appearance: Standard pharmacy container. Clearly  labeled. Filled Date: 7 / 3 / 2025 Last Medication intake:  Day before yesterday  Pt states since she started estradiol and progesterone  her overall achiness and joint pain and fibromyalgia pain has decreased. States her general overall wellbeing has improved.    UDS:  Summary  Date Value Ref Range Status  06/08/2023 Note  Final    Comment:    ==================================================================== [employer] Select 13 (MW) ==================================================================== Test                             Result       Flag       Units  Drug Present and Declared for Prescription Verification   7-aminoclonazepam              68           EXPECTED   ng/mg creat    7-aminoclonazepam is an expected metabolite of clonazepam . Source of    clonazepam  is a scheduled prescription medication.    Oxycodone                       416          EXPECTED   ng/mg creat   Oxymorphone                    108          EXPECTED   ng/mg creat   Noroxycodone                   1904         EXPECTED   ng/mg creat   Noroxymorphone                 45           EXPECTED   ng/mg creat    Sources of oxycodone  are scheduled prescription medications.    Oxymorphone, noroxycodone, and noroxymorphone are expected    metabolites of oxycodone . Oxymorphone is also available as a    scheduled prescription medication.    Butalbital                      PRESENT      EXPECTED ==================================================================== Test                      Result    Flag   Units      Ref Range   Creatinine              262              mg/dL      >=79 ==================================================================== Declared Medications:  The flagging and interpretation on this report are based on the  following declared medications.  Unexpected results may arise from  inaccuracies in the declared medications.   **Note: The testing scope of this panel includes these  medications:   Butalbital  (Fioricet )  Clonazepam  (Klonopin )  Oxycodone    **Note: The testing scope of this panel does not include the  following reported medications:   Acetaminophen   Acetaminophen  (Fioricet )  Albuterol  (Ventolin  HFA)  Amitriptyline  (Elavil )  Biotin   Caffeine  (Fioricet )  Calcium  (Tums)  Diphenhydramine  (Benadryl )  Duloxetine (Cymbalta)  Epinephrine  (EpiPen )  Famotidine  (Pepcid )  Fluticasone  (Breo)  Galcanezumab (Emgality)  Indeterminate Medication  Levocetirizine (Xyzal )  Levothyroxine  (Synthroid )  Lisinopril  (Zestril )  Magnesium  (Mag-Ox)  Meclizine  (Antivert )  Metformin   Metoprolol  (Lopressor )  Montelukast  (Singulair )  Multivitamin  Naloxone  (Narcan )  Nitrofurantoin  (Macrodantin )  Omeprazole (Prilosec)  Phentermine (Adipex)  Pregabalin  (Lyrica )  Rizatriptan  Rosuvastatin  (Crestor )  Semaglutide (Ozempic)  Tizanidine  (Zanaflex )  Topical Diclofenac  (Voltaren )  Topiramate  (Topamax )  Vilanterol (Breo)  Vitamin D2 ==================================================================== For clinical consultation, please call [phone]. ====================================================================     No results found for: CBDTHCR No results found for: D8THCCBX No results found for: D9THCCBX  ROS  Constitutional: Denies any fever or chills Gastrointestinal: No reported hemesis, hematochezia, vomiting, or acute GI distress Musculoskeletal: Denies any acute onset joint swelling, redness, loss of ROM, or weakness Neurological: No reported episodes of acute onset apraxia, aphasia, dysarthria, agnosia, amnesia, paralysis, loss of coordination, or loss of consciousness  Medication Review  Alcohol Prep, Biotin , DULoxetine, Dupilumab, EPINEPHrine , Galcanezumab-gnlm, QUEtiapine , Semaglutide(0.25 or 0.5MG /DOS), Tab-A-Vite, acetaminophen , acidophilus, albuterol , amitriptyline , butalbital -acetaminophen -caffeine , calcium  carbonate, clonazePAM ,  cyanocobalamin , diclofenac  sodium, diphenhydrAMINE , ergocalciferol , estradiol, famotidine , glucose blood, hydrochlorothiazide, levocetirizine, levothyroxine , lisinopril , magnesium  oxide, meclizine , metFORMIN , metoprolol  tartrate, montelukast , naloxone , naphazoline-pheniramine, nitrofurantoin  (macrocrystal-monohydrate), nystatin , omeprazole, oxyCODONE , phentermine, pregabalin , progesterone , rizatriptan, rosuvastatin , tiZANidine , and topiramate   History Review  Allergy: Ms. Shampine is allergic to bactrim [sulfamethoxazole-trimethoprim], omalizumab, ciprofloxacin, shellfish allergy, and nsaids. Drug: Ms. Doukas  reports no history of drug use. Alcohol:  reports no history of alcohol use. Tobacco:  reports that she has never smoked. She has never used smokeless tobacco. Social: Ms. Montoya  reports that she has never smoked. She has never used smokeless tobacco. She reports that she does not drink alcohol and does not use drugs. Medical:  has a past medical history of Anemia, Anginal pain (HCC), Anxiety, Arthralgia of hip (07/29/2015), Arthritis, Arthritis, degenerative (07/29/2015), Asthma, Cephalalgia (07/25/2014), Dependence on unknown drug (HCC), Depression, Diabetes mellitus without complication (HCC), Difficult intubation, Dysrhythmia, Eczema, Fibromyalgia, Gastritis, GERD (gastroesophageal reflux disease), Gonalgia (07/29/2015), Gout, H/O cardiovascular disorder (03/10/2015), H/O surgical procedure (12/05/2012), H/O thyroid  disease (03/10/2015), Headache, Herpes, History of artificial joint (07/29/2015), History of hiatal hernia, Hypertension, Hypomagnesemia, Hypothyroidism, IDA (iron  deficiency anemia) (05/28/2019), LBP (low back pain) (07/29/2015), Neuromuscular disorder (HCC), Obesity, PCOS (polycystic ovarian syndrome), Primary osteoarthritis of both knees (07/29/2015), Sleep apnea, Thyroid  nodule (bilateral), and Umbilical hernia. Surgical: Ms. Dondero  has a past surgical history that includes Laparoscopic  partial gastrectomy; Shoulder arthroscopy (Right); Carpal tunnel release (Bilateral); Diagnostic laparoscopy; Cholecystectomy; Trigger finger release (Right); Thyroidectomy (N/A, 11/12/2015); left trigger finger; Roux-en-Y Gastric Bypass (06/03/2017); Hiatal hernia repair; peniculectomy (N/A, 07/05/2018); Total hip arthroplasty (Right, 11/27/2019); Joint replacement (Bilateral, hip); Appendectomy; Trigger finger release (Right, 04/24/2020); Colonoscopy with propofol  (N/A, 02/11/2021); and Esophagogastroduodenoscopy (egd) with propofol  (N/A, 02/11/2021). Family: family history includes Alcohol abuse in her father and mother; Anxiety disorder in her father and mother; Brain cancer (age of onset: 85) in her paternal uncle; Breast cancer (age of onset: 74) in her paternal aunt and paternal aunt; COPD in her father; Depression in her brother, father, and mother; Diabetes in her brother, father, and mother; Hypertension in her brother, father, and mother; Kidney cancer in her mother; Kidney failure in her father; Post-traumatic stress disorder in her father; Sleep apnea in her brother, father, and mother.  Laboratory Chemistry Profile   Renal Lab Results  Component Value Date   BUN 17 08/09/2022   CREATININE 1.04 (H) 08/09/2022   GFRAA >60 04/16/2020   GFRNONAA >60 08/09/2022    Hepatic Lab Results  Component Value Date   AST 25 08/07/2022   ALT 40 08/07/2022   ALBUMIN 3.5 08/07/2022  ALKPHOS 74 08/07/2022   LIPASE 37 08/07/2022    Electrolytes Lab Results  Component Value Date   NA 142 08/09/2022   K 4.8 08/09/2022   CL 116 (H) 08/09/2022   CALCIUM  7.8 (L) 08/09/2022   MG 2.5 (H) 08/07/2022   PHOS 5.5 (H) 05/22/2019    Bone Lab Results  Component Value Date   VD25OH 27.4 (L) 11/24/2015   VD125OH2TOT 41.5 11/24/2015    Inflammation (CRP: Acute Phase) (ESR: Chronic Phase) Lab Results  Component Value Date   CRP 0.7 09/27/2019   ESRSEDRATE 30 (H) 09/27/2019   LATICACIDVEN 1.7 08/07/2022          Note: Above Lab results reviewed.  Recent Imaging Review  DG PAIN CLINIC C-ARM 1-60 MIN NO REPORT Fluoro was used, but no Radiologist interpretation will be provided.  Please refer to NOTES tab for provider progress note. Note: Reviewed        Physical Exam  Vitals: BP 110/77   Pulse 77   Temp (!) 97.4 F (36.3 C)   Resp 17   Ht 5' 3 (1.6 m)   Wt (!) 337 lb (152.9 kg)   SpO2 97%   BMI 59.70 kg/m  BMI: Estimated body mass index is 59.7 kg/m as calculated from the following:   Height as of this encounter: 5' 3 (1.6 m).   Weight as of this encounter: 337 lb (152.9 kg). Ideal: Ideal body weight: 52.4 kg (115 lb 8.3 oz) Adjusted ideal body weight: 92.6 kg (204 lb 1.8 oz) General appearance: Well nourished, well developed, and well hydrated. In no apparent acute distress Mental status: Alert, oriented x 3 (person, place, & time)       Respiratory: No evidence of acute respiratory distress Eyes: PERLA  Upper Extremity (UE) Exam      Right  Left  Inspection    Skin color, temperature, and hair growth are WNL. No peripheral edema or cyanosis. No masses, redness, swelling, asymmetry, or associated skin lesions. No contractures.  Skin color, temperature, and hair growth are WNL. No peripheral edema or cyanosis. No masses, redness, swelling, asymmetry, or associated skin lesions. No contractures.          Functional ROM    Pain restricted ROM for shoulder  Unrestricted ROM                  Muscle Tone/Strength    Functionally intact. No obvious neuro-muscular anomalies detected.  Functionally intact. No obvious neuro-muscular anomalies detected.          Sensory (Neurological)    Musculoskeletal pain pattern affecting the shoulder  Unimpaired                  Palpation    No palpable anomalies              No palpable anomalies                      Maneuver Shoulder abduction (deltoid/supraspinatus, axillary/supra scapular n,, C5) Elbow flexion (biceps brachial,  musculoskeletal n, C5-6) Elbow extension (triceps, radial n, C7) Finger abduction (interossei, ulnar n, T1)    Shoulder abduction (deltoid/supraspinatus, axillary/supra scapular n,, C5) Elbow flexion (biceps brachial, musculoskeletal n, C5-6) Elbow extension (triceps, radial n, C7) Wrist extensors (C6) Finger extensors (C8) Finger abduction (interossei, ulnar n, T1)            Provocative Test    Phalen's test: deferred Tinel's test: deferred Apley's scratch test (touch  opposite shoulder):  Action 1 (Across chest): Decreased ROM Action 2 (Overhead): Decreased ROM Action 3 (LB reach): Decreased ROM  Phalen's test: deferred Tinel's test: deferred Apley's scratch test (touch opposite shoulder):  Action 1 (Across chest): Improved ROM Action 2 (Overhead): Improved ROM Action 3 (LB reach): deferred             Level  Myotome  Dermatome  Sclerotome  ROM  C5  Elbow flexion  Lateral upper arm      C6  Wrist extension  Thumb and index      C7  Elbow extension  Middle finger      C8  Finger extension  Ring and pinky finger      T1  Finger abduction  Medial elbow and axilla                                                                                                                                          Assessment   Diagnosis Status  1. Chronic right shoulder pain   2. Bilateral chronic knee pain   3. Chronic hip pain, right   4. Chronic bilateral low back pain without sciatica   5. Degeneration of intervertebral disc of lumbosacral region with discogenic back pain   6. Facet syndrome, lumbar   7. Secondary osteoarthritis of multiple sites   8. Chronic pain syndrome   9. Pharmacologic therapy   10. Chronic use of opiate for therapeutic purpose   11. Encounter for medication management   12. Encounter for chronic pain management    Having a Flare-up Controlled Controlled   Updated Problems: Problem  Chronic Right Shoulder Pain    Plan of Care   Problem-specific:  Assessment and Plan Will continue on current medication regimen.  Prescribing drug monitoring (PDMP) reviewed; findings consistent with the use of prescribed medication and no evidence of narcotic misuse or abuse. Routine UDS ordered today.  Schedule follow-up in 90 days for medication management.  Plan: Right Shoulder X-ray  Ms. WYLEE OGDEN has a current medication list which includes the following long-term medication(s): albuterol , diphenhydramine , estradiol, hydrochlorothiazide, magnesium  oxide, montelukast , naloxone , progesterone , rizatriptan, rizatriptan, oxycodone , [START ON 05/19/2024] oxycodone , and [START ON 06/18/2024] oxycodone .  Pharmacotherapy (Medications Ordered): Meds ordered this encounter  Medications   oxyCODONE  (OXY IR/ROXICODONE ) 5 MG immediate release tablet    Sig: Take 1 tablet (5 mg total) by mouth every 8 (eight) hours as needed for severe pain (pain score 7-10). Must last 30 days    Dispense:  90 tablet    Refill:  0    Not a duplicate. Do NOT delete! Dispense 1 day early if closed on refill date. Avoid benzodiazepines within 8 hours of opioids. Do not send refill requests.   oxyCODONE  (OXY IR/ROXICODONE ) 5 MG immediate release tablet    Sig: Take 1 tablet (5 mg total) by mouth every 8 (eight) hours  as needed for severe pain (pain score 7-10). Must last 30 days    Dispense:  90 tablet    Refill:  0    Not a duplicate. Do NOT delete! Dispense 1 day early if closed on refill date. Avoid benzodiazepines within 8 hours of opioids. Do not send refill requests.   oxyCODONE  (OXY IR/ROXICODONE ) 5 MG immediate release tablet    Sig: Take 1 tablet (5 mg total) by mouth every 8 (eight) hours as needed for severe pain (pain score 7-10). Must last 30 days    Dispense:  90 tablet    Refill:  0    Not a duplicate. Do NOT delete! Dispense 1 day early if closed on refill date. Avoid benzodiazepines within 8 hours of opioids. Do not send refill requests.    Orders:  Orders Placed This Encounter  Procedures   DG Shoulder Right    Please make sure that the patient understands that this needs to be done as soon as possible. Never have the patient do the imaging just before the next appointment. Inform patient that having the imaging done within the [facility] will expedite the availability of the results and will provide imaging availability to the requesting physician. In addition inform the patient that the imaging order has an expiration date and will not be renewed if not done within the active period.    Standing Status:   Future    Expiration Date:   07/20/2024    Scheduling Instructions:     Imaging must be done as soon as possible. Inform patient that order will expire within 30 days and I will not renew it.    Reason for Exam (SYMPTOM  OR DIAGNOSIS REQUIRED):   Right shoulder pain    Is the patient pregnant?:   No    Preferred imaging location?:   [facility]    Call Results- Best Contact Number?:   [phone] [facility]    Release to patient:   Immediate   ToxASSURE Select 13 (MW), Urine    Volume: 30 ml(s). Minimum 3 ml of urine is needed. Document temperature of fresh sample. Indications: Long term (current) use of opiate analgesic (S20.108)    Release to patient:   Immediate        Return in about 3 months (around 07/20/2024) for (F2F), (MM), Emmy Blanch NP.    Recent Visits Date Type Provider Dept  04/03/24 Office Visit Tanya Glisson, MD [facility]  03/15/24 Procedure visit Tanya Glisson, MD [facility]  03/12/24 Office Visit Tanya Glisson, MD [facility]  02/07/24 Procedure visit Tanya Glisson, MD [facility]  Showing recent visits within past 90 days and meeting all other requirements Today's Visits Date Type Provider Dept  04/19/24 Office Visit Kemal Amores K, NP [facility]  Showing  today's visits and meeting all other requirements Future Appointments No visits were found meeting these conditions. Showing future appointments within next 90 days and meeting all other requirements  I discussed the assessment and treatment plan with the patient. The patient was provided an opportunity to ask questions and all were answered. The patient agreed with the plan and demonstrated an understanding of the instructions.  Patient advised to call back or seek an in-person evaluation if the symptoms or condition worsens.  Duration of encounter: 30 minutes.  Total time on encounter, as per AMA guidelines included both the face-to-face and non-face-to-face time personally spent  by the physician and/or other qualified health care professional(s) on the day of the encounter (includes time in activities that require the physician or other qualified health care professional and does not include time in activities normally performed by clinical staff). Physician's time may include the following activities when performed: Preparing to see the patient (e.g., pre-charting review of records, searching for previously ordered imaging, lab work, and nerve conduction tests) Review of prior analgesic pharmacotherapies. Reviewing PMP Interpreting ordered tests (e.g., lab work, imaging, nerve conduction tests) Performing post-procedure evaluations, including interpretation of diagnostic procedures Obtaining and/or reviewing separately obtained history Performing a medically appropriate examination and/or evaluation Counseling and educating the patient/family/caregiver Ordering medications, tests, or procedures Referring and communicating with other health care professionals (when not separately reported) Documenting clinical information in the electronic or other health record Independently interpreting results (not separately reported) and communicating results to the patient/ family/caregiver Care  coordination (not separately reported)  Note by: Kavin Weckwerth K Tamekia Rotter, NP (TTS and AI technology used. I apologize for any typographical errors that were not detected and corrected.) Date: 04/19/2024; Time: 3:34 PM

## 2024-04-19 NOTE — Progress Notes (Signed)
 Nursing Pain Medication Assessment:  Safety precautions to be maintained throughout the outpatient stay will include: orient to surroundings, keep bed in low position, maintain call bell within reach at all times, provide assistance with transfer out of bed and ambulation.  Medication Inspection Compliance: Pill count conducted under aseptic conditions, in front of the patient. Neither the pills nor the bottle was removed from the patient's sight at any time. Once count was completed pills were immediately returned to the patient in their original bottle.  Medication: Oxycodone  IR Pill/Patch Count: 40 of 90 pills/patches remain Pill/Patch Appearance: Markings consistent with prescribed medication Bottle Appearance: Standard pharmacy container. Clearly labeled. Filled Date: 7 / 3 / 2025 Last Medication intake:  Day before yesterday  Pt states since she started estradiol and progesterone  her overall achiness and joint pain and fibromyalgia pain has decreased. States her general overall wellbeing has improved.

## 2024-04-23 LAB — TOXASSURE SELECT 13 (MW), URINE

## 2024-04-29 NOTE — Progress Notes (Unsigned)
 PROVIDER NOTE: Interpretation of information contained herein should be left to medically-trained personnel. Specific patient instructions are provided elsewhere under Patient Instructions section of medical record. This document was created in part using AI and STT-dictation technology, any transcriptional errors that may result from this process are unintentional.  Patient: Karen Lewis  Service: E/M   PCP: [name], MD  DOB: [birth year]  DOS: 04/30/2024  Provider: Eric DELENA Como, MD  MRN: [id number]  Delivery: Face-to-face  Specialty: Interventional Pain Management  Type: Established Patient  Setting: Ambulatory outpatient facility  Specialty designation: 09  Referring Prov.: [name], MD  Location: Outpatient office facility       History of present illness (HPI) [name], a 53 y.o. year old female, is here today because of her Chronic right shoulder pain [M25.511, G89.29]. Ms. Ohnemus primary complain today is Shoulder Pain (right)  Pertinent problems: Ms. Gangl has Fibromyalgia; Chronic knee pain (1ry area of Pain) (Bilateral) (R>L); Chronic low back pain (3ry area of Pain) (Bilateral) (R>L) w/o sciatica; Lumbar facet syndrome (Bilateral) (R>L); Secondary osteoarthritis of multiple sites; Grade 1  Anterolisthesis of L3/L4 & L4/L5 (1.4 cm); Chronic hip pain s/p THR (2ry area of pain) (Bilateral); S/P THR (total hip replacement) (Left); Lumbar spondylosis; Chronic pain syndrome; Neurogenic pain; Upper extremity pain; Chronic shoulder pain (Left); Osteoarthritis of shoulder (Left); Osteoarthritis of hip (Right); Secondary Osteoarthritis of knee (Bilateral) (R>L); Spondylosis without myelopathy or radiculopathy, lumbosacral region; Panniculitis; Leg swelling; Edema of both legs; History of total hip replacement (Bilateral); S/P THR (total hip replacement) (Right) (11/27/2019); Pain and numbness of left upper extremity; Cervical radiculitis (Left); Aftercare following joint  replacement surgery; Osteoarthritis of knee (Bilateral); Osteoarthritis of lumbar spine without myelopathy or radiculopathy; Lumbosacral radiculopathy at S1 (Left); Tricompartment osteoarthritis of knees (Bilateral); Lumbosacral radiculopathy at L2 (Right); Abnormal MRI, lumbar spine (07/01/2022); Chronic thigh pain (Right); Weakness of leg (Right); Lumbar interspinous bursitis (L2-L5); Spinal enthesopathy of lumbar region [facility]); Lumbar facet arthropathy (Multilevel) (Bilateral); Lumbar central spinal stenosis, w/o claudication (L2-3, L4-5, L5-S1); Lumbar foraminal stenosis (Bilateral: L2-3, L4-5) (L>R); Lumbosacral lateral recess stenosis (Bilateral: L4-5, L5-S1); Chronic shoulder pain (Right); Presence of artificial hip joint, bilateral; Lumbar facet joint pain; DDD (degenerative disc disease), lumbosacral; Cervical radiculopathy at C6; Cervicalgia; DDD (degenerative disc disease), cervical; Abnormal MRI, cervical spine (01/17/2024); Abnormal MRI, shoulder (02/09/2024) (Left); Osteoarthritis of glenohumeral joint (Left); Osteoarthritis of AC (acromioclavicular) joint (Left); Osteoarthritis of shoulder (Right); Arthralgia of acromioclavicular joint (Right); Osteoarthritis of AC (acromioclavicular) joint (Right); and Osteoarthritis of glenohumeral joint (Right) on their pertinent problem list.  Pain Assessment: Severity of Chronic pain is reported as a 4 /10. Location: Shoulder Right/upper back. Onset: More than a month ago. Quality: Burning (weakness). Timing: Constant. Modifying factor(s): rest, ice, heat, positioning. Vitals:  height is 5' 3 (1.6 m) and weight is 337 lb (152.9 kg) (abnormal). Her oral temperature is 97.4 F (36.3 C) (abnormal). Her blood pressure is 132/81 and her pulse is 81. Her respiration is 18 and oxygen saturation is 100%.  BMI: Estimated body mass index is 59.7 kg/m as calculated from the following:   Height as of this encounter: 5' 3 (1.6 m).   Weight as of this encounter: 337 lb  (152.9 kg).  Last encounter: 04/03/2024. Last procedure: 03/15/2024.  Reason for encounter: follow-up evaluation.  Patient seen on 04/19/2024 with primary complaint of right shoulder pain.  X-rays would suggest the patient to be having moderate joint space loss with degenerative changes of the  glenohumeral and acromioclavicular joints.  Soft tissue seems to be unremarkable.  Discussed the use of AI scribe software for clinical note transcription with the patient, who gave verbal consent to proceed.  History of Present Illness   Karen Lewis is a 53 year old female who presents with right shoulder pain.  She experiences moderate joint pain in the right shoulder.       Pharmacotherapy Assessment   Opioid Analgesic: Oxycodone  IR 5 mg 1 tab PO TID (#90/mo) (15 mg/day) MME/day: 22.5 mg/day.   Monitoring: [place] PMP: PDMP reviewed during this encounter.       Pharmacotherapy: No side-effects or adverse reactions reported. Compliance: No problems identified. Effectiveness: Clinically acceptable.  No notes on file  UDS:  Summary  Date Value Ref Range Status  04/19/2024 FINAL  Final    Comment:    ==================================================================== [employer] Select 13 (MW) ==================================================================== Test                             Result       Flag       Units  Drug Absent but Declared for Prescription Verification   Clonazepam                      Not Detected UNEXPECTED ng/mg creat   Oxycodone                       Not Detected UNEXPECTED ng/mg creat   Butalbital                      Not Detected UNEXPECTED ==================================================================== Test                      Result    Flag   Units      Ref Range   Creatinine              95               mg/dL      >=79 ==================================================================== Declared Medications:  The flagging and interpretation on this  report are based on the  following declared medications.  Unexpected results may arise from  inaccuracies in the declared medications.   **Note: The testing scope of this panel includes these medications:   Butalbital  (Fioricet )  Clonazepam  (Klonopin )  Oxycodone    **Note: The testing scope of this panel does not include the  following reported medications:   Acetaminophen  (Tylenol )  Acetaminophen  (Fioricet )  Albuterol  (Ventolin  HFA)  Amitriptyline  (Elavil )  Biotin   Caffeine  (Fioricet )  Calcium  (Tums)  Diphenhydramine  (Benadryl )  Duloxetine (Cymbalta)  Dupilumab  Epinephrine  (EpiPen )  Estradiol (Estrace)  Famotidine  (Pepcid )  Galcanezumab (Emgality)  Hydrochlorothiazide  Levocetirizine (Xyzal )  Levothyroxine  (Synthroid )  Lisinopril  (Zestril )  Magnesium  (Mag-Ox)  Meclizine  (Antivert )  Metformin   Metoprolol  (Lopressor )  Montelukast  (Singulair )  Naloxone  (Narcan )  Nitrofurantoin  (Macrobid )  Nystatin  (Mycostatin )  Omeprazole (Prilosec)  Phentermine (Adipex)  Pregabalin  (Lyrica )  Probiotic  Progesterone  (Prometrium )  Quetiapine  (Seroquel )  Rizatriptan (Maxalt)  Rosuvastatin  (Crestor )  Semaglutide (Ozempic)  Tizanidine  (Zanaflex )  Topical Diclofenac  (Voltaren )  Topiramate  (Topamax )  Vitamin B12  Vitamin D2 ==================================================================== For clinical consultation, please call [phone]. ====================================================================     No results found for: CBDTHCR No results found for: D8THCCBX No results found for: D9THCCBX  ROS  Constitutional: Denies any fever or chills Gastrointestinal: No reported hemesis, hematochezia, vomiting, or acute GI distress Musculoskeletal:  Denies any acute onset joint swelling, redness, loss of ROM, or weakness Neurological: No reported episodes of acute onset apraxia, aphasia, dysarthria, agnosia, amnesia, paralysis, loss of coordination, or loss of  consciousness  Medication Review  Alcohol Prep, Biotin , DULoxetine, Dupilumab, EPINEPHrine , Galcanezumab-gnlm, QUEtiapine , Semaglutide(0.25 or 0.5MG /DOS), Tab-A-Vite, acetaminophen , acidophilus, albuterol , amitriptyline , butalbital -acetaminophen -caffeine , calcium  carbonate, clonazePAM , cyanocobalamin , diclofenac  sodium, diphenhydrAMINE , ergocalciferol , estradiol, famotidine , glucose blood, hydrochlorothiazide, levocetirizine, levothyroxine , lisinopril , magnesium  oxide, meclizine , metFORMIN , metoprolol  tartrate, montelukast , naloxone , naphazoline-pheniramine, nitrofurantoin  (macrocrystal-monohydrate), nystatin , omeprazole, oxyCODONE , phentermine, pregabalin , progesterone , rizatriptan, rosuvastatin , tiZANidine , and topiramate   History Review  Allergy: Karen Lewis is allergic to bactrim [sulfamethoxazole-trimethoprim], omalizumab, ciprofloxacin, shellfish allergy, and nsaids. Drug: Karen Lewis  reports no history of drug use. Alcohol:  reports no history of alcohol use. Tobacco:  reports that she has never smoked. She has never used smokeless tobacco. Social: Karen Lewis  reports that she has never smoked. She has never used smokeless tobacco. She reports that she does not drink alcohol and does not use drugs. Medical:  has a past medical history of Anemia, Anginal pain (HCC), Anxiety, Arthralgia of hip (07/29/2015), Arthritis, Arthritis, degenerative (07/29/2015), Asthma, Cephalalgia (07/25/2014), Dependence on unknown drug (HCC), Depression, Diabetes mellitus without complication (HCC), Difficult intubation, Dysrhythmia, Eczema, Fibromyalgia, Gastritis, GERD (gastroesophageal reflux disease), Gonalgia (07/29/2015), Gout, H/O cardiovascular disorder (03/10/2015), H/O surgical procedure (12/05/2012), H/O thyroid  disease (03/10/2015), Headache, Herpes, History of artificial joint (07/29/2015), History of hiatal hernia, Hypertension, Hypomagnesemia, Hypothyroidism, IDA (iron  deficiency anemia) (05/28/2019), LBP (low  back pain) (07/29/2015), Neuromuscular disorder (HCC), Obesity, PCOS (polycystic ovarian syndrome), Primary osteoarthritis of both knees (07/29/2015), Sleep apnea, Thyroid  nodule (bilateral), and Umbilical hernia. Surgical: Karen Lewis  has a past surgical history that includes Laparoscopic partial gastrectomy; Shoulder arthroscopy (Right); Carpal tunnel release (Bilateral); Diagnostic laparoscopy; Cholecystectomy; Trigger finger release (Right); Thyroidectomy (N/A, 11/12/2015); left trigger finger; Roux-en-Y Gastric Bypass (06/03/2017); Hiatal hernia repair; peniculectomy (N/A, 07/05/2018); Total hip arthroplasty (Right, 11/27/2019); Joint replacement (Bilateral, hip); Appendectomy; Trigger finger release (Right, 04/24/2020); Colonoscopy with propofol  (N/A, 02/11/2021); and Esophagogastroduodenoscopy (egd) with propofol  (N/A, 02/11/2021). Family: family history includes Alcohol abuse in her father and mother; Anxiety disorder in her father and mother; Brain cancer (age of onset: 78) in her paternal uncle; Breast cancer (age of onset: 42) in her paternal aunt and paternal aunt; COPD in her father; Depression in her brother, father, and mother; Diabetes in her brother, father, and mother; Hypertension in her brother, father, and mother; Kidney cancer in her mother; Kidney failure in her father; Post-traumatic stress disorder in her father; Sleep apnea in her brother, father, and mother.  Laboratory Chemistry Profile   Renal Lab Results  Component Value Date   BUN 17 08/09/2022   CREATININE 1.04 (H) 08/09/2022   GFRAA >60 04/16/2020   GFRNONAA >60 08/09/2022    Hepatic Lab Results  Component Value Date   AST 25 08/07/2022   ALT 40 08/07/2022   ALBUMIN 3.5 08/07/2022   ALKPHOS 74 08/07/2022   LIPASE 37 08/07/2022    Electrolytes Lab Results  Component Value Date   NA 142 08/09/2022   K 4.8 08/09/2022   CL 116 (H) 08/09/2022   CALCIUM  7.8 (L) 08/09/2022   MG 2.5 (H) 08/07/2022   PHOS 5.5 (H)  05/22/2019    Bone Lab Results  Component Value Date   VD25OH 27.4 (L) 11/24/2015   VD125OH2TOT 41.5 11/24/2015    Inflammation (CRP: Acute Phase) (ESR: Chronic Phase) Lab Results  Component Value Date   CRP 0.7 09/27/2019   ESRSEDRATE 30 (H)  09/27/2019   LATICACIDVEN 1.7 08/07/2022         Note: Above Lab results reviewed.  Recent Imaging Review  DG Shoulder Right CLINICAL DATA:  Right shoulder pain  EXAM: RIGHT SHOULDER - 2+ VIEW  COMPARISON:  None Available.  FINDINGS: No acute fracture or dislocation. Moderate joint space loss of the glenohumeral joint. Degenerative changes of the [facility] joint. Soft tissues are unremarkable.  IMPRESSION: 1. No acute fracture or dislocation. 2. Moderate joint space loss of the glenohumeral joint, likely degenerative.  Electronically Signed   By: Rogelia Myers M.D.   On: 04/19/2024 17:27 Note: Reviewed        Physical Exam  Vitals: BP 132/81   Pulse 81   Temp (!) 97.4 F (36.3 C) (Oral)   Resp 18   Ht 5' 3 (1.6 m)   Wt (!) 337 lb (152.9 kg)   SpO2 100%   BMI 59.70 kg/m  BMI: Estimated body mass index is 59.7 kg/m as calculated from the following:   Height as of this encounter: 5' 3 (1.6 m).   Weight as of this encounter: 337 lb (152.9 kg). Ideal: Ideal body weight: 52.4 kg (115 lb 8.3 oz) Adjusted ideal body weight: 92.6 kg (204 lb 1.8 oz) General appearance: Well nourished, well developed, and well hydrated. In no apparent acute distress Mental status: Alert, oriented x 3 (person, place, & time)       Respiratory: No evidence of acute respiratory distress Eyes: PERLA   Assessment   Diagnosis Status  1. Chronic shoulder pain (Right)   2. Osteoarthritis of shoulder (Right)   3. Arthralgia of acromioclavicular joint (Right)   4. Osteoarthritis of AC (acromioclavicular) joint (Right)   5. Osteoarthritis of glenohumeral joint (Right)   6. Chronic shoulder pain (Left)   7. Osteoarthritis of AC (acromioclavicular)  joint (Left)   8. Osteoarthritis of glenohumeral joint (Left)   9. Osteoarthritis of shoulder (Left)    Controlled Controlled Controlled   Updated Problems: Problem  Osteoarthritis of shoulder (Right)  Arthralgia of acromioclavicular joint (Right)  Osteoarthritis of AC (acromioclavicular) joint (Right)  Osteoarthritis of glenohumeral joint (Right)  Chronic shoulder pain (Right)  Osteoarthritis of knee (Bilateral)    Plan of Care  Problem-specific:  Assessment and Plan    Right shoulder osteoarthritis   Moderate joint pain and degenerative changes in the right shoulder with loss of the glenohumeral joint space. X-ray shows no acute fracture or dislocation. Differential diagnosis includes potential rotator cuff pathology, which may require further investigation if symptoms persist after treatment. Administer injection to the right shoulder for pain management. If injection does not alleviate symptoms, consider ordering an MRI to evaluate for potential rotator cuff pathology.       Karen Lewis has a current medication list which includes the following long-term medication(s): albuterol , diphenhydramine , estradiol, hydrochlorothiazide, magnesium  oxide, montelukast , naloxone , oxycodone , [START ON 05/19/2024] oxycodone , [START ON 06/18/2024] oxycodone , progesterone , rizatriptan, and rizatriptan.  Pharmacotherapy (Medications Ordered): No orders of the defined types were placed in this encounter.  Orders:  Orders Placed This Encounter  Procedures   SHOULDER INJECTION    Standing Status:   Future    Expiration Date:   07/31/2024    Scheduling Instructions:     Procedure: Intra-articular shoulder (Glenohumeral) joint and (AC) Acromioclavicular joint injection     Side: Bilateral     Level: Glenohumeral joint and (AC) Acromioclavicular joint     Sedation: Patient's choice.     Timeframe: As permitted  by the schedule    Where will this procedure be performed?:   [facility]      Interventional Therapies  Risk  Complexity Considerations:   Estimated body mass index is 60.59 kg/m as calculated from the following:   Height as of 06/18/21: 5' 4 (1.626 m).   Weight as of 06/18/21: 353 lb (160.1 kg).     NOTE: NO RFA until BMI less or equal to 35.  (Gastric bypass done on 06/03/2017) Iodine allergy  Contrast dye allergy  Shellfish allergy    Planned  Pending:   Therapeutic bilateral IA shoulder (glenohumeral + AC) joint inj. L3R1    Under consideration:   Diagnostic/therapeutic left IA shoulder (glenohumeral + AC) joint inj. #2 (first of 2025)    Completed:   Diagnostic/therapeutic left IA shoulder (glenohumeral + AC) joint inj. x2 (first of 2025)  Therapeutic right L2-3 LESI x1 (04/29/2022) (100/100/60/60)  Therapeutic bilateral IA Monovisc knee inj. x1 (05/25/2022)  Therapeutic right trapezius TPI/MNB x1 (05/25/2022)  Therapeutic bilateral IA Zilretta  knee inj. x4 (03/25/2022) (100/100/80/80)  Therapeutic bilateral IA Hyalgan knee inj. x16 (06/24/2020) (100/100/60/60)  Palliative/therapeutic bilateral lumbar facet MBB x9 (07/07/2023) (100/100/70/70)  Therapeutic right IA hip injection x3 (07/07/2017) (100/50/25/>50)  Therapeutic left IA shoulder (glenohumeral joint) injection x1 (05/31/2017) (50/0/100/100)  (07/28/2023) referral to interventional pain management practice for bilateral lumbar facet RFA    Completed by other providers:   Therapeutic bilateral THR (total hip replacements) (Dr. Kathlynn) (Right: 11/27/2019) (Left: 07/29/2015)    Therapeutic  Palliative (PRN) options:   Palliative bilateral knee injections  Palliative bilateral lumbar facet MBBs    Pharmacotherapy  Nonopioid transferred 06/24/2020: Lyrica       Return in about 6 weeks (around 06/12/2024) for (ECT): (B) IA Shoulder inj. (Glonohumeral & AC).    Recent Visits Date Type Provider Dept  04/19/24 Office Visit Patel, Seema K, NP [facility]  04/03/24 Office Visit  Tanya Glisson, MD [facility]  03/15/24 Procedure visit Tanya Glisson, MD [facility]  03/12/24 Office Visit Tanya Glisson, MD [facility]  02/07/24 Procedure visit Tanya Glisson, MD [facility]  Showing recent visits within past 90 days and meeting all other requirements Today's Visits Date Type Provider Dept  04/30/24 Office Visit Tanya Glisson, MD [facility]  Showing today's visits and meeting all other requirements Future Appointments Date Type Provider Dept  06/19/24 Appointment Tanya Glisson, MD [facility]  Showing future appointments within next 90 days and meeting all other requirements  I discussed the assessment and treatment plan with the patient. The patient was provided an opportunity to ask questions and all were answered. The patient agreed with the plan and demonstrated an understanding of the instructions.  Patient advised to call back or seek an in-person evaluation if the symptoms or condition worsens.  Duration of encounter: 38 minutes.  Total time on encounter, as per AMA guidelines included both the face-to-face and non-face-to-face time personally spent by the physician and/or other qualified health care professional(s) on the day of the encounter (includes time in activities that require the physician or other qualified health care professional and does not include time in activities normally performed by clinical staff). Physician's time may include the following activities when performed: Preparing to see the patient (e.g., pre-charting review of records, searching for previously ordered imaging, lab work, and nerve conduction tests) Review of prior analgesic pharmacotherapies. Reviewing PMP Interpreting ordered tests (e.g., lab work, imaging, nerve conduction tests) Performing  post-procedure evaluations, including interpretation of diagnostic procedures Obtaining and/or  reviewing separately obtained history Performing a medically appropriate examination and/or evaluation Counseling and educating the patient/family/caregiver Ordering medications, tests, or procedures Referring and communicating with other health care professionals (when not separately reported) Documenting clinical information in the electronic or other health record Independently interpreting results (not separately reported) and communicating results to the patient/ family/caregiver Care coordination (not separately reported)  Note by: Eric DELENA Como, MD (TTS and AI technology used. I apologize for any typographical errors that were not detected and corrected.) Date: 04/30/2024; Time: 4:39 PM

## 2024-04-30 ENCOUNTER — Ambulatory Visit: Attending: Pain Medicine | Admitting: Pain Medicine

## 2024-04-30 ENCOUNTER — Encounter: Payer: Self-pay | Admitting: Pain Medicine

## 2024-04-30 VITALS — BP 132/81 | HR 81 | Temp 97.4°F | Resp 18 | Ht 63.0 in | Wt 337.0 lb

## 2024-04-30 DIAGNOSIS — M25512 Pain in left shoulder: Secondary | ICD-10-CM | POA: Diagnosis not present

## 2024-04-30 DIAGNOSIS — M25511 Pain in right shoulder: Secondary | ICD-10-CM | POA: Diagnosis present

## 2024-04-30 DIAGNOSIS — M19012 Primary osteoarthritis, left shoulder: Secondary | ICD-10-CM | POA: Insufficient documentation

## 2024-04-30 DIAGNOSIS — M19011 Primary osteoarthritis, right shoulder: Secondary | ICD-10-CM | POA: Diagnosis not present

## 2024-04-30 DIAGNOSIS — G8929 Other chronic pain: Secondary | ICD-10-CM | POA: Insufficient documentation

## 2024-04-30 NOTE — Patient Instructions (Signed)

## 2024-05-07 NOTE — Progress Notes (Signed)
 Subjective:     Patient ID: Karen Lewis is a 53 y.o. female.  Urinary Tract Infection   : PRESENTS WITH UTI SYMPTOMS. ONSET SATURDAY. HAD A PROCEDURE DONE. NO FEVER. HX OF UTI . DENIES FLANK PAIN. RECORDS REVIEWED.   Past Medical History:  has a past medical history of Abdominal hernia (Hiatel hernia 2013 repaired 2015), Acid reflux, AKI (acute kidney injury) () (08/08/2022), Allergic rhinitis due to allergen (09/13/1982), Allergic state, Anxiety, Arrhythmia (Sept 1998), Arthritis, Asthma without status asthmaticus (HHS-HCC), Chickenpox, Cholecystitis (05-28-2004), Depression, Dermatitis (09/14/1979), Diabetes mellitus type 2, uncomplicated (CMS/HHS-HCC), Eczema, Edema (09-13-1996), Elevated LFTs (06/07/2019), Fibromyalgia, Fibromyalgia, Food intolerance (09/13/2006), Gastroesophageal reflux disease without esophagitis (05/04/2016), GERD (gastroesophageal reflux disease), Goiter (December 20, 2011), Gout, [place] filter in place, Herpes simplex virus (HSV) infection, History of blood transfusion (Oct. 2022), History of pneumonia (Jan 2022), Hyperlipidemia, Hypertension, IDA (iron  deficiency anemia) (05/28/2019), Migraines, MRSA (methicillin resistant Staphylococcus aureus), Neuro-degenerative disorders (), Obese, Personal history of other diseases of the nervous system and sense organs (03/10/2015), Personal history of other diseases of the respiratory system (03/10/2015), Personal history of other specified conditions (03/10/2015), Polycystic ovarian syndrome, Radiculopathy affecting upper extremity, Rotator cuff tear, Sinusitis, unspecified (09/13/2014), Sleep apnea, Syncopal episodes, Thrombophlebitis of iliac vein, unspecified laterality (CMS/HHS-HCC) (05/04/2016), Thyroid  disease, Venereal disease, and Vertigo. Past Surgical History:  has a past surgical history that includes Laparoscopic Sleeve Gastrectomy (N/A, 12/13/2011); Replacement total hip w/  resurfacing implants (Left, 03/22/2013); Shoulder  arthroscopy w/ acromial repair (Right); esophagogastrodoudenoscopy w/biopsy (11/19/2013); Cholecystectomy; Bilateral carpal tunnel release; Thyroidectomy Total (11/12/2015); Right Trigger Thumb Release ; Trigger Finger Release (Left, 11/17/2016); Joint replacement (03/22/2013); Knee arthroscopy (05/2000); Incision Tendon Sheath For Trigger Finger (Right, 02/25/2020); Incision Tendon Sheath For Trigger Finger (Right, 04/24/2020); Bronchoscopy (09/14/2007); Arthroplasty Hip Total (Right, 11/27/2019); Colonoscopy (02/11/2021); egd (02/11/2021); egd (N/A, 03/11/2023); Colonoscopy (2009); Upper gastrointestinal endoscopy (2015); Gastrectomy (Gastric bypass 2013); ERCP (2005); Carpal tunnel release (2011); Gastric restriction surgery (December 13, 2011); Sigmoidoscopy (2015); Bariatric Surgery (12/13/2011); and Gynecologic cryosurgery (90's ???). Family History: family history includes Alcohol  abuse in her father and mother; Allergic rhinitis in her maternal grandfather and mother; Allergies in her brother, father, and mother; Alzheimer's disease in her maternal grandfather; Anxiety in her mother; Aortic aneurysm in her maternal grandfather; Arthritis in her brother, brother, father, maternal grandmother, and mother; Asthma in her brother, maternal grandfather, and mother; COPD in her father, maternal grandfather, and mother; Cancer in her mother, paternal aunt, paternal aunt, and paternal uncle; Clotting disorder in her mother; Coronary Artery Disease (Blocked arteries around heart) in her father and mother; Deep vein thrombosis (DVT or abnormal blood clot formation) in her maternal grandmother and mother; Dementia in her maternal grandfather; Depression in her father, maternal grandmother, and mother; Diabetes in her father, maternal grandfather, maternal grandmother, mother, paternal aunt, paternal grandfather, and paternal grandmother; Diabetes type II in her brother, father, maternal grandfather, maternal grandmother,  mother, paternal aunt, paternal grandfather, and paternal grandmother; Emphysema in her maternal grandfather; Heart disease in her father and mother; High blood pressure (Hypertension) in her brother, father, mother, paternal grandfather, and paternal grandmother; Hyperlipidemia (Elevated cholesterol) in her father, mother, and paternal aunt; Kidney cancer in her mother; Kidney failure in her father and paternal grandmother; Lung cancer in her mother; Mental illness in her father and mother; Migraines in her mother; Obesity in her brother, father, maternal grandfather, maternal grandmother, mother, paternal aunt, paternal aunt, paternal aunt, paternal grandfather, paternal grandmother, and paternal uncle; Osteoarthritis in her father, maternal grandmother,  mother, and paternal grandmother; Osteoporosis (Thinning of bones) in her maternal grandmother and mother; Peripheral vascular disease in her father; Renal cell carcinoma in her mother; Sleep apnea in her father and mother; Substance Abuse in her father, maternal uncle, maternal uncle, and mother. Social History:  reports that she has never smoked. She has never used smokeless tobacco. She reports that she does not drink alcohol  and does not use drugs. Prior to encounter Medications:  Current Outpatient Medications on File Prior to Visit  Medication Sig Dispense Refill  . acetaminophen  (TYLENOL ) 325 MG tablet Take 2 tablets (650 mg total) by mouth every 4 (four) hours as needed for Pain    . albuterol  (PROVENTIL ) 2.5 mg /3 mL (0.083 %) nebulizer solution Take 3 mLs (2.5 mg total) by nebulization every 6 (six) hours as needed 75 mL 1  . albuterol  MDI, PROVENTIL , VENTOLIN , PROAIR , HFA (VENTOLIN  HFA) 90 mcg/actuation inhaler Inhale 2 inhalations into the lungs every 6 (six) hours as needed 18 g 3  . amitriptyline  (ELAVIL ) 100 MG tablet Take 1 tablet (100 mg total) by mouth at bedtime 90 tablet 3  . benzonatate (TESSALON) 200 MG capsule Take 1 capsule (200 mg  total) by mouth 3 (three) times daily as needed for Cough 100 capsule 3  . blood glucose diagnostic (ACCU-CHEK GUIDE TEST STRIPS) test strip once daily Use as instructed. 100 strip 4  . blood glucose diagnostic test strip Use 2 (two) times daily 200 each 3  . butalbital -acetaminophen -caffeine  (FIORICET ) 50-325-40 mg tablet Take 1 tablet by mouth every 6 (six) hours as needed for Pain 30 tablet 5  . calcium  carbonate (TUMS E-X) 300 mg (750 mg) chewable tablet Take by mouth    . clonazePAM  (KLONOPIN ) 0.5 MG tablet Take 0.5 mg by mouth once daily    . cyanocobalamin  (VITAMIN B12) 1000 MCG tablet Take 1,000 mcg by mouth twice a week Mon & Fri only    . diphenhydrAMINE  (BENADRYL ) 25 mg capsule Take 1 capsule (25 mg total) by mouth as needed for Itching    . DULoxetine  (CYMBALTA ) 60 MG DR capsule Take 60 mg by mouth once daily    . dupilumab (DUPIXENT) 300 mg/2 mL pen injector Inject 2 mLs (300 mg total) subcutaneously every 14 (fourteen) days 4 mL 11  . EPINEPHrine  (EPIPEN ) 0.3 mg/0.3 mL auto-injector Inject 0.3 mg into the muscle once as needed for Anaphylaxis    . ergocalciferol , vitamin D2, 1,250 mcg (50,000 unit) capsule TAKE ONE CAPSULE BY MOUTH TWICE A WEEK 24 capsule 3  . estradiol  (DOTTI ) patch 0.05 mg/24 hr Place 1 patch onto the skin twice a week 8 patch 3  . estradioL  (ESTRACE ) 0.01 % (0.1 mg/gram) vaginal cream Place 1 g vaginally twice a week for 90 days Insert 1/4 applicator twice weekly 42.5 g 1  . famotidine  (PEPCID ) 20 MG tablet Take 20 mg by mouth 2 (two) times daily    . fluticasone  furoate-vilanteroL (BREO ELLIPTA ) 200-25 mcg/dose DsDv Inhale 1 Puff into the lungs once daily 1 each 4  . galcanezumab-gnlm (EMGALITY PEN) 120 mg/mL PnIj Inject 120 mg subcutaneously every 28 (twenty-eight) days 90 day supply 3 mL 3  . hydroCHLOROthiazide (HYDRODIURIL) 25 MG tablet Take 1 tablet (25 mg total) by mouth once daily 90 tablet 3  . Lacto.acidophilus-Bif.animalis 32 billion cell Cap Take 1  capsule by mouth once daily    . lancing device Misc Use 1 each as directed. Accu Chek Softclix. DX: E11.8 1 each 0  . levocetirizine (  XYZAL ) 5 MG tablet TAKE 1 TABLET EVERY EVENING 90 tablet 3  . levothyroxine  (SYNTHROID ) 125 MCG tablet Take 1 tablet (125 mcg total) by mouth once daily Monday-Saturday.  Take 1.5 pills each week on Sunday.  Take on an empty stomach with a glass of water  at least 30-60 minutes before breakfast. 96 tablet 3  . lisinopriL  (ZESTRIL ) 5 MG tablet Take 1 tablet (5 mg total) by mouth once daily 90 tablet 3  . magnesium  oxide (MAG-OX) 400 mg (241.3 mg magnesium ) tablet TAKE 3 TABLETS BY MOUTH IN THE MORNING AND 3 TABLETS AT BEDTIME 540 tablet 3  . meclizine  (ANTIVERT ) 25 mg tablet Take 1 tablet (25 mg total) by mouth 3 (three) times daily as needed for Dizziness 90 tablet 2  . metFORMIN  (GLUCOPHAGE -XR) 500 MG XR tablet Take 2 tablets (1,000 mg total) by mouth 2 (two) times daily (Patient taking differently: Take 500 mg by mouth 2 (two) times daily) 360 tablet 3  . metoprolol  TARTrate (LOPRESSOR ) 50 MG tablet Take 1 tablet (50 mg total) by mouth 2 (two) times daily And and additional tab prn 180 tablet 3  . montelukast  (SINGULAIR ) 10 mg tablet Take 1 tablet (10 mg total) by mouth once daily 90 tablet 3  . naphazoline-pheniramine (NAPHCON-A) 0.025-0.3 % ophthalmic solution Place 1 drop into both eyes once daily as needed    . nystatin  (MYCOSTATIN ) 100,000 unit/gram powder Apply topically 2 (two) times daily 60 g 2  . omeprazole (PRILOSEC) 40 MG DR capsule Take 1 capsule (40 mg total) by mouth 2 (two) times daily 180 capsule 3  . oxyCODONE  (ROXICODONE ) 5 MG immediate release tablet Take 5 mg by mouth every 4 (four) hours as needed for Pain Up to 3 times daily as needed    . pregabalin  (LYRICA ) 225 MG capsule Take 1 capsule (225 mg total) by mouth 2 (two) times daily 180 capsule 1  . progesterone  (PROMETRIUM ) 100 MG capsule Take 1 capsule (100 mg total) by mouth at bedtime for 30  days 30 capsule 0  . QUEtiapine  (SEROQUEL ) 50 MG tablet Take 50 mg by mouth at bedtime    . rizatriptan (MAXALT-MLT) 10 MG disintegrating tablet     . rosuvastatin  (CRESTOR ) 40 MG tablet Take 1 tablet (40 mg total) by mouth once daily 90 tablet 3  . semaglutide (OZEMPIC) 2 mg/dose (8 mg/3 mL) pen injector Inject 0.75 mLs (2 mg total) subcutaneously every 7 (seven) days    . sodium fluoride-pot nitrate 1.1-5 % once daily    . tiZANidine  (ZANAFLEX ) 4 MG tablet Take 1 tablet (4 mg total) by mouth 2 (two) times daily as needed 180 tablet 3  . topiramate  (TOPAMAX ) 100 MG tablet TAKE 1/2 TABLET BY MOUTH EVERY MORNING & ONE TABLET NIGHTLY FOR 2 WEEKS, THEN TAKE ONE TABLET BY MOUTH TWICE DAILY (Patient taking differently: Take 100 mg by mouth 2 (two) times daily) 60 tablet 5  . triamcinolone  (NASACORT  AQ) 55 mcg nasal spray Place 2 sprays into both nostrils once daily 1 mL 4  . blood glucose diagnostic test strip 1 each (1 strip total) 2 (two) times daily E11.9 ACCU CHEK GUIDE TEST STRIPS. 200 each 3  . multivitamin (TAB-A-VITE) 400 mcg tablet TAKE (2) TABLETS BY MOUTH ONCE DAILY. 180 tablet 1  . nitrofurantoin  (MACRODANTIN ) 100 MG capsule take one capsule by mouth four times daily    . rimegepant (NURTEC ODT) 75 mg disintegrating tablet Take 1 tablet (75 mg total) by mouth once daily as needed (  migraine) Do not exceed 1 tablet within 24 hours. 8 tablet 3   Current Facility-Administered Medications on File Prior to Visit  Medication Dose Route Frequency Provider Last Rate Last Admin  . onabotulinumtoxinA (BOTOX) solution 155 Units  155 Units Intramuscular As Directed [place], [place]   155 Units at 03/08/24 1500   Allergies: is allergic to ciprofloxacin, shellfish containing products, sulfamethoxazole, trimethoprim, aspirin, bactrim [sulfamethoxazole-trimethoprim], motrin [ibuprofen], nsaids (non-steroidal anti-inflammatory drug), sulfa (sulfonamide antibiotics), and xolair [omalizumab].  This  an established patient is here today for: office visit.  Review of Systems     Objective:   Physical Exam Vitals and nursing note reviewed.  Constitutional:      General: She is not in acute distress.    Appearance: Normal appearance. She is not ill-appearing or toxic-appearing.  Cardiovascular:     Rate and Rhythm: Normal rate.  Pulmonary:     Effort: Pulmonary effort is normal. No respiratory distress.  Abdominal:     Tenderness: There is no right CVA tenderness or left CVA tenderness.  Skin:    General: Skin is warm and dry.  Neurological:     Mental Status: She is alert.        Assessment:     1. Urinary tract infection with hematuria, site unspecified   -     Urine Culture, Routine - [facility] -     amoxicillin -clavulanate (AUGMENTIN ) 875-125 mg tablet; Take 1 tablet (875 mg total) by mouth every 12 (twelve) hours for 10 days  2. Urinary urgency   -     Urinalysis w/Microscopic  Other orders -     fluconazole  (DIFLUCAN ) 150 MG tablet; Take 1 tablet (150 mg total) by mouth once for 1 dose REPEAT IN 2 DAYS IF NEEDED     Plan:     Patient Instructions  YOUR URINE IS POS FOR INFECTION HOLD MACROBID  AND AMOX  I HAVE CULTURED AND PLACED YOU ON AUGMENTIN  AZO AS NEEDED.  WILL CONTACT YOU IF ANY NEW INSTRUCIONS ONCE CULTURE RESULTS REVIEWED  AVOID CAFFEINE  PRODUCTS.

## 2024-05-08 NOTE — Progress Notes (Signed)
 This note has been created using automated tools and reviewed for accuracy by [facility].  Chief Complaint  Patient presents with  . Hip Pain    Left hip pain    Subjective  Karen Lewis is a 53 y.o. female who presents for Hip Pain (Left hip pain) HPI History of Present Illness Karen Lewis is a 54 year old female with a history of total hip replacement who presents with left hip pain.  She has been experiencing pain in her left hip, specifically over the greater trochanter area, where she previously underwent a total hip replacement. No symptoms of thigh pain are present. Recent imaging has shown an anterior radiolucent line on the proximal third of the stem compared to prior x-rays from 2023.  She recently traveled to [place], visiting [place] and [place], and has returned from her trip. Additionally, her daughter has relocated to [place] from [place].  Review of Systems  Patient Active Problem List  Diagnosis  . Obesity, morbid, BMI 50 or higher (CMS-HCC)  . Hypertension associated with diabetes (CMS/HHS-HCC)  . Asthma (HHS-HCC)  . Gout  . Post-operative hypothyroidism  . Depression  . Eczema, unspecified  . Fibromyalgia  . Hyperlipidemia associated with type 2 diabetes mellitus , unspecified (CMS-HCC)  . Vitamin D  deficiency, unspecified  . Generalized anxiety disorder  . Binge eating disorder  . Major depressive disorder, single episode  . Dermatitis  . Migraine without status migrainosus, not intractable  . Insomnia  . Obstructive sleep apnea  . Low back pain  . Other chronic pain  . Encounter for therapeutic drug level monitoring  . Congenital spondylolisthesis  . History of methicillin resistant Staphylococcus aureus infection  . Presence of left artificial hip joint  . Hypomagnesemia  . Long term current use of opiate analgesic  . Uncomplicated opioid use  . Primary osteoarthritis of both knees  . Osteoarthritis  . Presence of orthopedic joint implant  .  Spondylosis of lumbar region without myelopathy or radiculopathy  . Primary osteoarthritis of both hips  . GERD (gastroesophageal reflux disease)  . Chest pain with low risk of acute coronary syndrome  . Allergy to iodine  . History of gastric bypass  . Primary osteoarthritis involving multiple joints  . Spondylosis without myelopathy or radiculopathy, lumbosacral region  . Status post total hip replacement, left  . Hypocalcemia  . Mycobacterium abscessus infection  . Elevated LFTs  . IDA (iron  deficiency anemia)  . Atypical chest pain  . Heart palpitations  . Type II diabetes mellitus with complication (CMS/HHS-HCC)  . Lymphedema  . Cervical radiculitis  . Barrett's esophagus without dysplasia  . Chronic use of opiate for therapeutic purpose  . Radiculopathy, lumbosacral region  . AKI (acute kidney injury) ()  . Chronic bilateral hip pain after total replacement of both hip joints  . Lumbar facet joint pain  . DDD (degenerative disc disease), cervical  . Mixed stress and urge urinary incontinence  . Genitourinary syndrome of menopause  . Genital herpes simplex    Outpatient Medications Prior to Visit  Medication Sig Dispense Refill  . EPINEPHrine  (EPIPEN ) 0.3 mg/0.3 mL auto-injector Inject 0.3 mg into the muscle once as needed for Anaphylaxis    . acetaminophen  (TYLENOL ) 325 MG tablet Take 2 tablets (650 mg total) by mouth every 4 (four) hours as needed for Pain    . albuterol  (PROVENTIL ) 2.5 mg /3 mL (0.083 %) nebulizer solution Take 3 mLs (2.5 mg total) by nebulization every 6 (six) hours as  needed 75 mL 1  . albuterol  MDI, PROVENTIL , VENTOLIN , PROAIR , HFA (VENTOLIN  HFA) 90 mcg/actuation inhaler Inhale 2 inhalations into the lungs every 6 (six) hours as needed 18 g 3  . amitriptyline  (ELAVIL ) 100 MG tablet Take 1 tablet (100 mg total) by mouth at bedtime 90 tablet 3  . benzonatate (TESSALON) 200 MG capsule Take 1 capsule (200 mg total) by mouth 3 (three) times daily as needed  for Cough 100 capsule 3  . blood glucose diagnostic (ACCU-CHEK GUIDE TEST STRIPS) test strip once daily Use as instructed. 100 strip 4  . blood glucose diagnostic test strip Use 2 (two) times daily 200 each 3  . blood glucose diagnostic test strip 1 each (1 strip total) 2 (two) times daily E11.9 ACCU CHEK GUIDE TEST STRIPS. 200 each 3  . butalbital -acetaminophen -caffeine  (FIORICET ) 50-325-40 mg tablet Take 1 tablet by mouth every 6 (six) hours as needed for Pain 30 tablet 5  . calcium  carbonate (TUMS E-X) 300 mg (750 mg) chewable tablet Take by mouth    . clonazePAM  (KLONOPIN ) 0.5 MG tablet Take 0.5 mg by mouth once daily    . cyanocobalamin  (VITAMIN B12) 1000 MCG tablet Take 1,000 mcg by mouth twice a week Mon & Fri only    . diphenhydrAMINE  (BENADRYL ) 25 mg capsule Take 1 capsule (25 mg total) by mouth as needed for Itching    . DULoxetine  (CYMBALTA ) 60 MG DR capsule Take 60 mg by mouth once daily    . dupilumab (DUPIXENT) 300 mg/2 mL pen injector Inject 2 mLs (300 mg total) subcutaneously every 14 (fourteen) days 4 mL 11  . ergocalciferol , vitamin D2, 1,250 mcg (50,000 unit) capsule TAKE ONE CAPSULE BY MOUTH TWICE A WEEK 24 capsule 3  . estradiol  (DOTTI ) patch 0.05 mg/24 hr Place 1 patch onto the skin twice a week 8 patch 3  . estradioL  (ESTRACE ) 0.01 % (0.1 mg/gram) vaginal cream Place 1 g vaginally twice a week for 90 days Insert 1/4 applicator twice weekly 42.5 g 1  . famotidine  (PEPCID ) 20 MG tablet Take 20 mg by mouth 2 (two) times daily    . fluticasone  furoate-vilanteroL (BREO ELLIPTA ) 200-25 mcg/dose DsDv Inhale 1 Puff into the lungs once daily 1 each 4  . galcanezumab-gnlm (EMGALITY PEN) 120 mg/mL PnIj Inject 120 mg subcutaneously every 28 (twenty-eight) days 90 day supply 3 mL 3  . hydroCHLOROthiazide (HYDRODIURIL) 25 MG tablet Take 1 tablet (25 mg total) by mouth once daily 90 tablet 3  . Lacto.acidophilus-Bif.animalis 32 billion cell Cap Take 1 capsule by mouth once daily    .  lancing device Misc Use 1 each as directed. Accu Chek Softclix. DX: E11.8 1 each 0  . levocetirizine (XYZAL ) 5 MG tablet TAKE 1 TABLET EVERY EVENING 90 tablet 3  . levothyroxine  (SYNTHROID ) 125 MCG tablet Take 1 tablet (125 mcg total) by mouth once daily Monday-Saturday.  Take 1.5 pills each week on Sunday.  Take on an empty stomach with a glass of water  at least 30-60 minutes before breakfast. 96 tablet 3  . lisinopriL  (ZESTRIL ) 5 MG tablet Take 1 tablet (5 mg total) by mouth once daily 90 tablet 3  . magnesium  oxide (MAG-OX) 400 mg (241.3 mg magnesium ) tablet TAKE 3 TABLETS BY MOUTH IN THE MORNING AND 3 TABLETS AT BEDTIME 540 tablet 3  . meclizine  (ANTIVERT ) 25 mg tablet Take 1 tablet (25 mg total) by mouth 3 (three) times daily as needed for Dizziness 90 tablet 2  . metFORMIN  (GLUCOPHAGE -XR) 500  MG XR tablet Take 2 tablets (1,000 mg total) by mouth 2 (two) times daily (Patient taking differently: Take 500 mg by mouth 2 (two) times daily) 360 tablet 3  . metoprolol  TARTrate (LOPRESSOR ) 50 MG tablet Take 1 tablet (50 mg total) by mouth 2 (two) times daily And and additional tab prn 180 tablet 3  . montelukast  (SINGULAIR ) 10 mg tablet Take 1 tablet (10 mg total) by mouth once daily 90 tablet 3  . multivitamin (TAB-A-VITE) 400 mcg tablet TAKE (2) TABLETS BY MOUTH ONCE DAILY. 180 tablet 1  . naphazoline-pheniramine (NAPHCON-A) 0.025-0.3 % ophthalmic solution Place 1 drop into both eyes once daily as needed    . nitrofurantoin  (MACRODANTIN ) 100 MG capsule take one capsule by mouth four times daily    . nystatin  (MYCOSTATIN ) 100,000 unit/gram powder Apply topically 2 (two) times daily 60 g 2  . omeprazole (PRILOSEC) 40 MG DR capsule Take 1 capsule (40 mg total) by mouth 2 (two) times daily 180 capsule 3  . oxyCODONE  (ROXICODONE ) 5 MG immediate release tablet Take 5 mg by mouth every 4 (four) hours as needed for Pain Up to 3 times daily as needed    . pregabalin  (LYRICA ) 225 MG capsule Take 1 capsule (225  mg total) by mouth 2 (two) times daily 180 capsule 1  . progesterone  (PROMETRIUM ) 100 MG capsule Take 1 capsule (100 mg total) by mouth at bedtime for 30 days 30 capsule 0  . QUEtiapine  (SEROQUEL ) 50 MG tablet Take 50 mg by mouth at bedtime    . rimegepant (NURTEC ODT) 75 mg disintegrating tablet Take 1 tablet (75 mg total) by mouth once daily as needed (migraine) Do not exceed 1 tablet within 24 hours. 8 tablet 3  . rizatriptan (MAXALT-MLT) 10 MG disintegrating tablet     . rosuvastatin  (CRESTOR ) 40 MG tablet Take 1 tablet (40 mg total) by mouth once daily 90 tablet 3  . semaglutide (OZEMPIC) 2 mg/dose (8 mg/3 mL) pen injector Inject 0.75 mLs (2 mg total) subcutaneously every 7 (seven) days    . sodium fluoride-pot nitrate 1.1-5 % once daily    . tiZANidine  (ZANAFLEX ) 4 MG tablet Take 1 tablet (4 mg total) by mouth 2 (two) times daily as needed 180 tablet 3  . topiramate  (TOPAMAX ) 100 MG tablet TAKE 1/2 TABLET BY MOUTH EVERY MORNING & ONE TABLET NIGHTLY FOR 2 WEEKS, THEN TAKE ONE TABLET BY MOUTH TWICE DAILY (Patient taking differently: Take 100 mg by mouth 2 (two) times daily) 60 tablet 5  . triamcinolone  (NASACORT  AQ) 55 mcg nasal spray Place 2 sprays into both nostrils once daily 1 mL 4  . amoxicillin  (AMOXIL ) 500 MG tablet TAKE 4 CAPSULES BY MOUTH 1 HOUR BEFORE DENTAL APPOINTMENTS    . cephalexin  (KEFLEX ) 500 MG capsule Take 4 pills one hour prior to surgery then tid 12 capsule 0   Facility-Administered Medications Prior to Visit  Medication Dose Route Frequency Provider Last Rate Last Admin  . onabotulinumtoxinA (BOTOX) solution 155 Units  155 Units Intramuscular As Directed Dewey Pellet Lyle, [place]   155 Units at 03/08/24 1500      Objective  Vitals:   05/07/24 1354  BP: 116/72  Weight: (!) 152.9 kg (337 lb)  Height: 160 cm (5' 3)  PainSc:   5  PainLoc: Hip   Body mass index is 59.7 kg/m.  Home Vitals:     Physical Exam Physical Exam MUSCULOSKELETAL: Tenderness over the  left greater trochanter. Normal hip abduction strength.  Constitutional:  alert, in NAD, and communicates well   Results RADIOLOGY Hip X-ray: Anterior radiolucent line on proximal third of the stem, no evidence of excessive polyethylene wear, no evidence of loosening or subsidence (05/07/2024)     Assessment/Plan:   Assessment & Plan Left hip trochanteric bursitis   Trochanteric bursitis of the left hip presents with tenderness over the greater trochanter. Hip abduction strength is normal, and there are no symptoms of thigh pain. X-ray shows an anterior radiolucent line on the stem without evidence of loosening or potting. Administer a trochanteric bursa injection on the left side with 1 cc bimethasone and 2 cc local anesthetic after prepping the skin with chlorhexidine . Apply a Band-Aid post-injection. Schedule a follow-up in about a week and a half for trigger finger surgery. Order pre-op antibiotics for the upcoming surgery. Diagnoses and all orders for this visit:  Trochanteric bursitis of left hip -     BUPivacaine  HCl (MARCAINE ) 0.5 % injection 2 mL -     betamethasone  acetate-betamethasone  sodium phosphate  (CELESTONE ) injection 6 mg  Left hip pain -     X-ray hip left 2 or 3 views with or without pelvis; Future    This visit was coded based on medical decision making (MDM).           Future Appointments     Date/Time Provider Department Center Visit Type   05/16/2024 1:30 PM (Arrive by 1:00 PM) Dewey Alberta Mace, PA [facility] [address] [facility] [place] RETURN BOTOX   05/18/2024 10:45 AM Kathlynn Ozell Pac, MD [facility] C PROCEDURE (WITH COPAY)   05/21/2024 1:00 PM Kathlynn Ozell Pac, MD [facility] ORTHO POST-OP   05/23/2024 1:30 PM Cherilyn Debby Quivers, MD [facility] C RETURN VISIT   05/23/2024 2:30 PM Lane Arthea Locus, MD [facility] C RETURN VISIT   05/31/2024 12:15 PM Auston, Reyes BIRCH, MD  [facility] C PHYSICAL   06/01/2024 10:15 AM Kathlynn Ozell Pac, MD [facility] [place] C ORTHO POST-OP   06/12/2024 1:15 PM [facility] WEST LAB [facility] C LAB   06/12/2024 1:45 PM Schermerhorn, Demetra Vaporis, MD [facility] C GYN RETURN   08/22/2024 4:00 PM (Arrive by 3:30 PM) Babara Rolland Cake, NP [facility] [address] [facility] [place] RETURN BOTOX   08/30/2024 1:45 PM Theotis Lavelle BRAVO, MD [facility] C RETURN VISIT   10/31/2024 2:00 PM Florencio Cara Endow, MD [facility] C FOLLOW UP       There are no Patient Instructions on file for this visit.  An after visit summary was provided for the patient either in written format (printed) or through [facility].  This note has been created using automated tools and reviewed for accuracy by [facility]

## 2024-05-12 ENCOUNTER — Inpatient Hospital Stay (HOSPITAL_COMMUNITY)
Admission: EM | Admit: 2024-05-12 | Discharge: 2024-05-21 | DRG: 336 | Disposition: A | Discharge status: Home Health | Attending: Internal Medicine | Admitting: Internal Medicine

## 2024-05-12 ENCOUNTER — Observation Stay (HOSPITAL_COMMUNITY): Admitting: Anesthesiology

## 2024-05-12 ENCOUNTER — Encounter (HOSPITAL_COMMUNITY): Payer: Self-pay

## 2024-05-12 ENCOUNTER — Encounter (HOSPITAL_COMMUNITY)
Admission: EM | Disposition: A | Discharge status: Home Health | Payer: Self-pay | Source: Home / Self Care | Attending: Internal Medicine

## 2024-05-12 ENCOUNTER — Other Ambulatory Visit: Payer: Self-pay

## 2024-05-12 ENCOUNTER — Emergency Department (HOSPITAL_COMMUNITY)

## 2024-05-12 DIAGNOSIS — Z8249 Family history of ischemic heart disease and other diseases of the circulatory system: Secondary | ICD-10-CM

## 2024-05-12 DIAGNOSIS — K2270 Barrett's esophagus without dysplasia: Secondary | ICD-10-CM | POA: Diagnosis present

## 2024-05-12 DIAGNOSIS — E66813 Obesity, class 3: Secondary | ICD-10-CM | POA: Diagnosis present

## 2024-05-12 DIAGNOSIS — K56609 Unspecified intestinal obstruction, unspecified as to partial versus complete obstruction: Secondary | ICD-10-CM

## 2024-05-12 DIAGNOSIS — F1120 Opioid dependence, uncomplicated: Secondary | ICD-10-CM | POA: Diagnosis present

## 2024-05-12 DIAGNOSIS — I890 Lymphedema, not elsewhere classified: Secondary | ICD-10-CM | POA: Diagnosis present

## 2024-05-12 DIAGNOSIS — Z66 Do not resuscitate: Secondary | ICD-10-CM | POA: Diagnosis not present

## 2024-05-12 DIAGNOSIS — Z6841 Body Mass Index (BMI) 40.0 and over, adult: Secondary | ICD-10-CM

## 2024-05-12 DIAGNOSIS — K353 Acute appendicitis with localized peritonitis, without perforation or gangrene: Secondary | ICD-10-CM | POA: Diagnosis not present

## 2024-05-12 DIAGNOSIS — E890 Postprocedural hypothyroidism: Secondary | ICD-10-CM | POA: Diagnosis present

## 2024-05-12 DIAGNOSIS — E1165 Type 2 diabetes mellitus with hyperglycemia: Secondary | ICD-10-CM | POA: Diagnosis not present

## 2024-05-12 DIAGNOSIS — E119 Type 2 diabetes mellitus without complications: Secondary | ICD-10-CM

## 2024-05-12 DIAGNOSIS — T884XXA Failed or difficult intubation, initial encounter: Secondary | ICD-10-CM | POA: Diagnosis present

## 2024-05-12 DIAGNOSIS — Z825 Family history of asthma and other chronic lower respiratory diseases: Secondary | ICD-10-CM

## 2024-05-12 DIAGNOSIS — Z841 Family history of disorders of kidney and ureter: Secondary | ICD-10-CM

## 2024-05-12 DIAGNOSIS — T884XXD Failed or difficult intubation, subsequent encounter: Secondary | ICD-10-CM | POA: Diagnosis not present

## 2024-05-12 DIAGNOSIS — E782 Mixed hyperlipidemia: Secondary | ICD-10-CM | POA: Diagnosis present

## 2024-05-12 DIAGNOSIS — Z96641 Presence of right artificial hip joint: Secondary | ICD-10-CM | POA: Diagnosis present

## 2024-05-12 DIAGNOSIS — Z803 Family history of malignant neoplasm of breast: Secondary | ICD-10-CM

## 2024-05-12 DIAGNOSIS — R509 Fever, unspecified: Secondary | ICD-10-CM | POA: Diagnosis present

## 2024-05-12 DIAGNOSIS — G8929 Other chronic pain: Secondary | ICD-10-CM | POA: Diagnosis present

## 2024-05-12 DIAGNOSIS — Z91013 Allergy to seafood: Secondary | ICD-10-CM

## 2024-05-12 DIAGNOSIS — R1031 Right lower quadrant pain: Secondary | ICD-10-CM | POA: Diagnosis not present

## 2024-05-12 DIAGNOSIS — R627 Adult failure to thrive: Secondary | ICD-10-CM | POA: Diagnosis present

## 2024-05-12 DIAGNOSIS — F32A Depression, unspecified: Secondary | ICD-10-CM | POA: Diagnosis present

## 2024-05-12 DIAGNOSIS — Z886 Allergy status to analgesic agent status: Secondary | ICD-10-CM

## 2024-05-12 DIAGNOSIS — Z833 Family history of diabetes mellitus: Secondary | ICD-10-CM

## 2024-05-12 DIAGNOSIS — G4730 Sleep apnea, unspecified: Secondary | ICD-10-CM | POA: Diagnosis present

## 2024-05-12 DIAGNOSIS — Z79890 Hormone replacement therapy (postmenopausal): Secondary | ICD-10-CM

## 2024-05-12 DIAGNOSIS — K567 Ileus, unspecified: Secondary | ICD-10-CM | POA: Diagnosis not present

## 2024-05-12 DIAGNOSIS — F1920 Other psychoactive substance dependence, uncomplicated: Secondary | ICD-10-CM | POA: Diagnosis present

## 2024-05-12 DIAGNOSIS — F411 Generalized anxiety disorder: Secondary | ICD-10-CM | POA: Diagnosis not present

## 2024-05-12 DIAGNOSIS — Z808 Family history of malignant neoplasm of other organs or systems: Secondary | ICD-10-CM

## 2024-05-12 DIAGNOSIS — Z888 Allergy status to other drugs, medicaments and biological substances status: Secondary | ICD-10-CM

## 2024-05-12 DIAGNOSIS — K660 Peritoneal adhesions (postprocedural) (postinfection): Secondary | ICD-10-CM | POA: Diagnosis present

## 2024-05-12 DIAGNOSIS — K3580 Unspecified acute appendicitis: Principal | ICD-10-CM | POA: Diagnosis present

## 2024-05-12 DIAGNOSIS — Z792 Long term (current) use of antibiotics: Secondary | ICD-10-CM

## 2024-05-12 DIAGNOSIS — Z882 Allergy status to sulfonamides status: Secondary | ICD-10-CM

## 2024-05-12 DIAGNOSIS — J45909 Unspecified asthma, uncomplicated: Secondary | ICD-10-CM | POA: Diagnosis present

## 2024-05-12 DIAGNOSIS — Z903 Acquired absence of stomach [part of]: Secondary | ICD-10-CM

## 2024-05-12 DIAGNOSIS — Z8051 Family history of malignant neoplasm of kidney: Secondary | ICD-10-CM

## 2024-05-12 DIAGNOSIS — Z881 Allergy status to other antibiotic agents status: Secondary | ICD-10-CM

## 2024-05-12 DIAGNOSIS — N1831 Chronic kidney disease, stage 3a: Secondary | ICD-10-CM | POA: Diagnosis present

## 2024-05-12 DIAGNOSIS — E282 Polycystic ovarian syndrome: Secondary | ICD-10-CM | POA: Diagnosis present

## 2024-05-12 DIAGNOSIS — J4540 Moderate persistent asthma, uncomplicated: Secondary | ICD-10-CM | POA: Diagnosis present

## 2024-05-12 DIAGNOSIS — Z811 Family history of alcohol abuse and dependence: Secondary | ICD-10-CM

## 2024-05-12 DIAGNOSIS — K439 Ventral hernia without obstruction or gangrene: Secondary | ICD-10-CM | POA: Diagnosis not present

## 2024-05-12 DIAGNOSIS — Z91041 Radiographic dye allergy status: Secondary | ICD-10-CM

## 2024-05-12 DIAGNOSIS — D72829 Elevated white blood cell count, unspecified: Secondary | ICD-10-CM | POA: Diagnosis present

## 2024-05-12 DIAGNOSIS — N390 Urinary tract infection, site not specified: Secondary | ICD-10-CM | POA: Diagnosis present

## 2024-05-12 DIAGNOSIS — Z7985 Long-term (current) use of injectable non-insulin antidiabetic drugs: Secondary | ICD-10-CM

## 2024-05-12 DIAGNOSIS — Z7951 Long term (current) use of inhaled steroids: Secondary | ICD-10-CM

## 2024-05-12 DIAGNOSIS — K9189 Other postprocedural complications and disorders of digestive system: Secondary | ICD-10-CM | POA: Diagnosis not present

## 2024-05-12 DIAGNOSIS — F50819 Binge eating disorder, unspecified: Secondary | ICD-10-CM | POA: Diagnosis present

## 2024-05-12 DIAGNOSIS — I10 Essential (primary) hypertension: Secondary | ICD-10-CM | POA: Diagnosis present

## 2024-05-12 DIAGNOSIS — E1122 Type 2 diabetes mellitus with diabetic chronic kidney disease: Secondary | ICD-10-CM | POA: Diagnosis present

## 2024-05-12 DIAGNOSIS — K219 Gastro-esophageal reflux disease without esophagitis: Secondary | ICD-10-CM | POA: Diagnosis present

## 2024-05-12 DIAGNOSIS — M797 Fibromyalgia: Secondary | ICD-10-CM | POA: Diagnosis present

## 2024-05-12 DIAGNOSIS — Z79899 Other long term (current) drug therapy: Secondary | ICD-10-CM

## 2024-05-12 DIAGNOSIS — K3532 Acute appendicitis with perforation and localized peritonitis, without abscess: Secondary | ICD-10-CM | POA: Diagnosis not present

## 2024-05-12 DIAGNOSIS — Z7984 Long term (current) use of oral hypoglycemic drugs: Secondary | ICD-10-CM

## 2024-05-12 DIAGNOSIS — Z9049 Acquired absence of other specified parts of digestive tract: Secondary | ICD-10-CM

## 2024-05-12 DIAGNOSIS — Z9884 Bariatric surgery status: Secondary | ICD-10-CM

## 2024-05-12 DIAGNOSIS — K436 Other and unspecified ventral hernia with obstruction, without gangrene: Secondary | ICD-10-CM

## 2024-05-12 DIAGNOSIS — E1149 Type 2 diabetes mellitus with other diabetic neurological complication: Secondary | ICD-10-CM | POA: Diagnosis present

## 2024-05-12 DIAGNOSIS — Z818 Family history of other mental and behavioral disorders: Secondary | ICD-10-CM

## 2024-05-12 DIAGNOSIS — G894 Chronic pain syndrome: Secondary | ICD-10-CM | POA: Diagnosis present

## 2024-05-12 DIAGNOSIS — I129 Hypertensive chronic kidney disease with stage 1 through stage 4 chronic kidney disease, or unspecified chronic kidney disease: Secondary | ICD-10-CM | POA: Diagnosis present

## 2024-05-12 HISTORY — PX: LAPAROSCOPIC LYSIS OF ADHESIONS: SHX5905

## 2024-05-12 HISTORY — PX: XI ROBOTIC LAPAROSCOPIC ASSISTED APPENDECTOMY: SHX6877

## 2024-05-12 LAB — COMPREHENSIVE METABOLIC PANEL WITH GFR
ALT: 39 U/L (ref 0–44)
AST: 26 U/L (ref 15–41)
Albumin: 3.7 g/dL (ref 3.5–5.0)
Alkaline Phosphatase: 79 U/L (ref 38–126)
Anion gap: 12 (ref 5–15)
BUN: 19 mg/dL (ref 6–20)
CO2: 22 mmol/L (ref 22–32)
Calcium: 8.4 mg/dL — ABNORMAL LOW (ref 8.9–10.3)
Chloride: 102 mmol/L (ref 98–111)
Creatinine, Ser: 1.28 mg/dL — ABNORMAL HIGH (ref 0.44–1.00)
GFR, Estimated: 50 mL/min — ABNORMAL LOW (ref >60)
Glucose, Bld: 150 mg/dL — ABNORMAL HIGH (ref 70–99)
Potassium: 4 mmol/L (ref 3.5–5.1)
Sodium: 136 mmol/L (ref 135–145)
Total Bilirubin: 0.7 mg/dL (ref 0.0–1.2)
Total Protein: 7.4 g/dL (ref 6.5–8.1)

## 2024-05-12 LAB — CBC
HCT: 41.3 % (ref 36.0–46.0)
Hemoglobin: 13.6 g/dL (ref 12.0–15.0)
MCH: 29.5 pg (ref 26.0–34.0)
MCHC: 32.9 g/dL (ref 30.0–36.0)
MCV: 89.6 fL (ref 80.0–100.0)
Platelets: 300 K/uL (ref 150–400)
RBC: 4.61 MIL/uL (ref 3.87–5.11)
RDW: 13.3 % (ref 11.5–15.5)
WBC: 21.8 K/uL — ABNORMAL HIGH (ref 4.0–10.5)
nRBC: 0 % (ref 0.0–0.2)

## 2024-05-12 LAB — URINALYSIS, ROUTINE W REFLEX MICROSCOPIC
Bilirubin Urine: NEGATIVE
Glucose, UA: NEGATIVE mg/dL
Hgb urine dipstick: NEGATIVE
Ketones, ur: NEGATIVE mg/dL
Leukocytes,Ua: NEGATIVE
Nitrite: NEGATIVE
Protein, ur: NEGATIVE mg/dL
Specific Gravity, Urine: 1.012 (ref 1.005–1.030)
pH: 7 (ref 5.0–8.0)

## 2024-05-12 LAB — GLUCOSE, CAPILLARY
Glucose-Capillary: 147 mg/dL — ABNORMAL HIGH (ref 70–99)
Glucose-Capillary: 166 mg/dL — ABNORMAL HIGH (ref 70–99)

## 2024-05-12 LAB — LIPASE, BLOOD: Lipase: 40 U/L (ref 11–51)

## 2024-05-12 LAB — LACTIC ACID, PLASMA: Lactic Acid, Venous: 1 mmol/L (ref 0.5–1.9)

## 2024-05-12 SURGERY — APPENDECTOMY, ROBOT-ASSISTED, LAPAROSCOPIC
Anesthesia: General

## 2024-05-12 MED ORDER — FENTANYL CITRATE (PF) 100 MCG/2ML IJ SOLN
INTRAMUSCULAR | Status: DC | PRN
  Administered 2024-05-12: 100 ug via INTRAVENOUS

## 2024-05-12 MED ORDER — PHENYLEPHRINE 80 MCG/ML (10ML) SYRINGE FOR IV PUSH (FOR BLOOD PRESSURE SUPPORT)
PREFILLED_SYRINGE | INTRAVENOUS | Status: DC | PRN
  Administered 2024-05-12: 80 ug via INTRAVENOUS

## 2024-05-12 MED ORDER — HYDROMORPHONE HCL 1 MG/ML IJ SOLN
0.5000 mg | INTRAMUSCULAR | Status: DC | PRN
  Administered 2024-05-12: 0.5 mg via INTRAVENOUS
  Filled 2024-05-12: qty 0.5

## 2024-05-12 MED ORDER — SUCCINYLCHOLINE CHLORIDE 200 MG/10ML IV SOSY
PREFILLED_SYRINGE | INTRAVENOUS | Status: AC
  Filled 2024-05-12: qty 10

## 2024-05-12 MED ORDER — PHENYLEPHRINE 80 MCG/ML (10ML) SYRINGE FOR IV PUSH (FOR BLOOD PRESSURE SUPPORT)
PREFILLED_SYRINGE | INTRAVENOUS | Status: AC
  Filled 2024-05-12: qty 10

## 2024-05-12 MED ORDER — SUGAMMADEX SODIUM 200 MG/2ML IV SOLN
INTRAVENOUS | Status: AC
  Filled 2024-05-12: qty 2

## 2024-05-12 MED ORDER — BISACODYL 5 MG PO TBEC
5.0000 mg | DELAYED_RELEASE_TABLET | Freq: Every day | ORAL | Status: DC | PRN
  Administered 2024-05-14: 5 mg via ORAL
  Filled 2024-05-12: qty 1

## 2024-05-12 MED ORDER — STERILE WATER FOR IRRIGATION IR SOLN
Status: DC | PRN
Start: 2024-05-12 — End: 2024-05-12
  Administered 2024-05-12: 1000 mL

## 2024-05-12 MED ORDER — OXYCODONE HCL 5 MG PO TABS
10.0000 mg | ORAL_TABLET | Freq: Four times a day (QID) | ORAL | Status: DC | PRN
  Administered 2024-05-12 – 2024-05-14 (×5): 10 mg via ORAL
  Filled 2024-05-12 (×5): qty 2

## 2024-05-12 MED ORDER — FENTANYL CITRATE (PF) 100 MCG/2ML IJ SOLN
INTRAMUSCULAR | Status: AC
  Filled 2024-05-12: qty 2

## 2024-05-12 MED ORDER — INSULIN ASPART 100 UNIT/ML IJ SOLN
0.0000 [IU] | Freq: Three times a day (TID) | INTRAMUSCULAR | Status: DC
  Administered 2024-05-12 – 2024-05-13 (×2): 2 [IU] via SUBCUTANEOUS
  Administered 2024-05-13 – 2024-05-14 (×3): 3 [IU] via SUBCUTANEOUS
  Administered 2024-05-14: 2 [IU] via SUBCUTANEOUS
  Administered 2024-05-15: 3 [IU] via SUBCUTANEOUS
  Administered 2024-05-15: 5 [IU] via SUBCUTANEOUS
  Administered 2024-05-15 – 2024-05-16 (×2): 2 [IU] via SUBCUTANEOUS

## 2024-05-12 MED ORDER — PROPOFOL 10 MG/ML IV BOLUS
INTRAVENOUS | Status: AC
  Filled 2024-05-12: qty 20

## 2024-05-12 MED ORDER — PROPOFOL 10 MG/ML IV BOLUS
INTRAVENOUS | Status: DC | PRN
  Administered 2024-05-12: 150 mg via INTRAVENOUS

## 2024-05-12 MED ORDER — PANTOPRAZOLE SODIUM 40 MG IV SOLR
40.0000 mg | INTRAVENOUS | Status: DC
  Filled 2024-05-12: qty 10

## 2024-05-12 MED ORDER — ACETAMINOPHEN 500 MG PO TABS
1000.0000 mg | ORAL_TABLET | Freq: Four times a day (QID) | ORAL | Status: DC
  Administered 2024-05-12 – 2024-05-16 (×13): 1000 mg via ORAL
  Filled 2024-05-12 (×14): qty 2

## 2024-05-12 MED ORDER — HYDRALAZINE HCL 20 MG/ML IJ SOLN: 10.0000 mg | INTRAMUSCULAR | Status: DC | PRN

## 2024-05-12 MED ORDER — PIPERACILLIN-TAZOBACTAM 3.375 G IVPB 30 MIN
3.3750 g | Freq: Once | INTRAVENOUS | Status: AC
  Administered 2024-05-12: 3.375 g via INTRAVENOUS
  Filled 2024-05-12: qty 50

## 2024-05-12 MED ORDER — MIDAZOLAM HCL 2 MG/2ML IJ SOLN
INTRAMUSCULAR | Status: DC | PRN
  Administered 2024-05-12: 2 mg via INTRAVENOUS

## 2024-05-12 MED ORDER — ENOXAPARIN SODIUM 80 MG/0.8ML IJ SOSY
75.0000 mg | PREFILLED_SYRINGE | INTRAMUSCULAR | Status: DC
  Administered 2024-05-13 – 2024-05-16 (×4): 75 mg via SUBCUTANEOUS
  Filled 2024-05-12 (×4): qty 0.8

## 2024-05-12 MED ORDER — BUPIVACAINE-EPINEPHRINE (PF) 0.5% -1:200000 IJ SOLN
INTRAMUSCULAR | Status: AC
Start: 2024-05-12 — End: 2024-05-12
  Filled 2024-05-12: qty 30

## 2024-05-12 MED ORDER — BUPIVACAINE HCL (PF) 0.5 % IJ SOLN
INTRAMUSCULAR | Status: AC
  Filled 2024-05-12: qty 30

## 2024-05-12 MED ORDER — LACTATED RINGERS IV SOLN: INTRAVENOUS | Status: DC

## 2024-05-12 MED ORDER — ACETAMINOPHEN 650 MG RE SUPP: 650.0000 mg | Freq: Four times a day (QID) | RECTAL | Status: DC | PRN

## 2024-05-12 MED ORDER — ROCURONIUM BROMIDE 10 MG/ML (PF) SYRINGE
PREFILLED_SYRINGE | INTRAVENOUS | Status: AC
  Filled 2024-05-12: qty 10

## 2024-05-12 MED ORDER — LACTATED RINGERS IV BOLUS
1000.0000 mL | Freq: Once | INTRAVENOUS | Status: AC
  Administered 2024-05-12: 1000 mL via INTRAVENOUS

## 2024-05-12 MED ORDER — SUCCINYLCHOLINE CHLORIDE 20 MG/ML IJ SOLN
INTRAMUSCULAR | Status: DC | PRN
  Administered 2024-05-12: 100 mg via INTRAVENOUS

## 2024-05-12 MED ORDER — ALBUTEROL SULFATE (2.5 MG/3ML) 0.083% IN NEBU
2.5000 mg | INHALATION_SOLUTION | RESPIRATORY_TRACT | Status: DC | PRN
  Administered 2024-05-15: 2.5 mg via RESPIRATORY_TRACT
  Filled 2024-05-12: qty 3

## 2024-05-12 MED ORDER — BUPIVACAINE HCL (PF) 0.5 % IJ SOLN
INTRAMUSCULAR | Status: DC | PRN
  Administered 2024-05-12: 30 mL

## 2024-05-12 MED ORDER — ROCURONIUM BROMIDE 10 MG/ML (PF) SYRINGE
PREFILLED_SYRINGE | INTRAVENOUS | Status: DC | PRN
  Administered 2024-05-12: 60 mg via INTRAVENOUS

## 2024-05-12 MED ORDER — KETOROLAC TROMETHAMINE 15 MG/ML IJ SOLN
15.0000 mg | Freq: Once | INTRAMUSCULAR | Status: AC
  Administered 2024-05-12: 15 mg via INTRAVENOUS
  Filled 2024-05-12: qty 1

## 2024-05-12 MED ORDER — MIDAZOLAM HCL 2 MG/2ML IJ SOLN
INTRAMUSCULAR | Status: AC
  Filled 2024-05-12: qty 2

## 2024-05-12 MED ORDER — ACETAMINOPHEN 325 MG PO TABS: 650.0000 mg | ORAL_TABLET | Freq: Four times a day (QID) | ORAL | Status: DC | PRN

## 2024-05-12 MED ORDER — SUGAMMADEX SODIUM 200 MG/2ML IV SOLN
INTRAVENOUS | Status: DC | PRN
  Administered 2024-05-12: 200 mg via INTRAVENOUS

## 2024-05-12 MED ORDER — ONDANSETRON HCL 4 MG/2ML IJ SOLN
4.0000 mg | Freq: Four times a day (QID) | INTRAMUSCULAR | Status: DC | PRN
  Administered 2024-05-13 – 2024-05-16 (×2): 4 mg via INTRAVENOUS
  Filled 2024-05-12 (×2): qty 2

## 2024-05-12 MED ORDER — PROCHLORPERAZINE EDISYLATE 10 MG/2ML IJ SOLN: 10.0000 mg | INTRAMUSCULAR | Status: DC | PRN

## 2024-05-12 MED ORDER — HYDROMORPHONE HCL 1 MG/ML IJ SOLN
0.5000 mg | INTRAMUSCULAR | Status: DC | PRN
  Administered 2024-05-12 – 2024-05-13 (×5): 0.5 mg via INTRAVENOUS
  Filled 2024-05-12 (×5): qty 0.5

## 2024-05-12 MED ORDER — OXYCODONE-ACETAMINOPHEN 5-325 MG PO TABS
1.0000 | ORAL_TABLET | Freq: Once | ORAL | Status: AC
  Administered 2024-05-12: 1 via ORAL
  Filled 2024-05-12: qty 1

## 2024-05-12 MED ORDER — ONDANSETRON HCL 4 MG PO TABS
4.0000 mg | ORAL_TABLET | Freq: Four times a day (QID) | ORAL | Status: DC | PRN
  Administered 2024-05-16: 4 mg via ORAL
  Filled 2024-05-12: qty 1

## 2024-05-12 MED ORDER — PIPERACILLIN-TAZOBACTAM 3.375 G IVPB
3.3750 g | Freq: Three times a day (TID) | INTRAVENOUS | Status: DC
  Administered 2024-05-12 – 2024-05-16 (×11): 3.375 g via INTRAVENOUS
  Filled 2024-05-12 (×11): qty 50

## 2024-05-12 MED ORDER — ENOXAPARIN SODIUM 40 MG/0.4ML IJ SOSY: 40.0000 mg | PREFILLED_SYRINGE | INTRAMUSCULAR | Status: DC

## 2024-05-12 MED ORDER — IOHEXOL 300 MG/ML  SOLN
100.0000 mL | Freq: Once | INTRAMUSCULAR | Status: AC | PRN
  Administered 2024-05-12: 100 mL via INTRAVENOUS

## 2024-05-12 MED ORDER — SODIUM CHLORIDE 0.9 % IR SOLN
Status: DC | PRN
  Administered 2024-05-12: 3000 mL

## 2024-05-12 SURGICAL SUPPLY — 45 items
CHLORAPREP W/TINT 26 (MISCELLANEOUS) ×3 IMPLANT
COVER LIGHT HANDLE STERIS (MISCELLANEOUS) ×4 IMPLANT
COVER MAYO STAND XLG (MISCELLANEOUS) ×2 IMPLANT
COVER TIP SHEARS 8 DVNC (MISCELLANEOUS) ×1 IMPLANT
DEFOGGER SCOPE WARM SEASHARP (MISCELLANEOUS) ×2 IMPLANT
DERMABOND ADVANCED .7 DNX12 (GAUZE/BANDAGES/DRESSINGS) ×2 IMPLANT
DRAPE ARM DVNC X/XI (DISPOSABLE) ×6 IMPLANT
DRAPE COLUMN DVNC XI (DISPOSABLE) ×2 IMPLANT
DRSG TEGADERM 2-3/8X2-3/4 SM (GAUZE/BANDAGES/DRESSINGS) ×3 IMPLANT
ELECT PENCIL ROCKER SW 15FT (MISCELLANEOUS) ×1 IMPLANT
ELECTRODE REM PT RTRN 9FT ADLT (ELECTROSURGICAL) ×1 IMPLANT
FORCEPS BPLR R/ABLATION 8 DVNC (INSTRUMENTS) ×2 IMPLANT
GAUZE SPONGE 2X2 STRL 8-PLY (GAUZE/BANDAGES/DRESSINGS) ×3 IMPLANT
GLOVE BIOGEL PI IND STRL 6.5 (GLOVE) ×6 IMPLANT
GLOVE BIOGEL PI IND STRL 7.0 (GLOVE) ×6 IMPLANT
GLOVE SURG SS PI 6.5 STRL IVOR (GLOVE) ×6 IMPLANT
GOWN STRL REUS W/TWL LRG LVL3 (GOWN DISPOSABLE) ×7 IMPLANT
GRASPER SUT TROCAR 14GX15 (MISCELLANEOUS) ×2 IMPLANT
IRRIGATOR SUCT 8 DISP DVNC XI (IRRIGATION / IRRIGATOR) ×1 IMPLANT
KIT PINK PAD W/HEAD ARM REST (MISCELLANEOUS) ×2 IMPLANT
KIT TURNOVER KIT A (KITS) ×2 IMPLANT
MANIFOLD NEPTUNE II (INSTRUMENTS) ×2 IMPLANT
NDL HYPO 21X1.5 SAFETY (NEEDLE) ×1 IMPLANT
NDL INSUFFLATION 14GA 120MM (NEEDLE) ×1 IMPLANT
NEEDLE HYPO 21X1.5 SAFETY (NEEDLE) ×2 IMPLANT
NEEDLE INSUFFLATION 14GA 120MM (NEEDLE) ×2 IMPLANT
OBTURATOR OPTICALSTD 8 DVNC (TROCAR) ×2 IMPLANT
PACK LAP CHOLE LZT030E (CUSTOM PROCEDURE TRAY) ×2 IMPLANT
PENCIL HANDSWITCHING (ELECTRODE) ×2 IMPLANT
RELOAD STAPLE 30 2.5 WHT DVNC (STAPLE) ×2 IMPLANT
SCISSORS MNPLR CVD DVNC XI (INSTRUMENTS) IMPLANT
SEAL UNIV 5-12 XI (MISCELLANEOUS) ×8 IMPLANT
SEALER VESSEL EXT DVNC XI (MISCELLANEOUS) ×2 IMPLANT
SET BASIN LINEN APH (SET/KITS/TRAYS/PACK) ×2 IMPLANT
SET TUBE SMOKE EVAC HIGH FLOW (TUBING) ×2 IMPLANT
SOL .9 NS 3000ML IRR UROMATIC (IV SOLUTION) ×1 IMPLANT
STAPLER 30 CRVD 8 SUREFORM (STAPLE) ×2 IMPLANT
SUT MNCRL AB 4-0 PS2 18 (SUTURE) ×3 IMPLANT
SUT SILK 2-0 18XBRD TIE 12 (SUTURE) IMPLANT
SUT VICRYL 0 UR6 27IN ABS (SUTURE) ×3 IMPLANT
SYR 30ML LL (SYRINGE) ×2 IMPLANT
SYSTEM RETRIEVL 5MM INZII UNIV (BASKET) ×2 IMPLANT
TAPE TRANSPORE STRL 2 31045 (GAUZE/BANDAGES/DRESSINGS) ×2 IMPLANT
TRAY FOLEY W/BAG SLVR 16FR ST (SET/KITS/TRAYS/PACK) ×2 IMPLANT
WATER STERILE IRR 500ML POUR (IV SOLUTION) ×2 IMPLANT

## 2024-05-12 NOTE — Anesthesia Postprocedure Evaluation (Signed)
 Anesthesia Post Note  Patient: AMARIS DELAFUENTE  Procedure(s) Performed: APPENDECTOMY, ROBOT-ASSISTED, LAPAROSCOPIC  Patient location during evaluation: ICU Anesthesia Type: General Level of consciousness: awake and alert Pain management: pain level controlled Vital Signs Assessment: post-procedure vital signs reviewed and stable Respiratory status: spontaneous breathing, nonlabored ventilation, respiratory function stable and patient connected to nasal cannula oxygen Cardiovascular status: blood pressure returned to baseline and stable Postop Assessment: no apparent nausea or vomiting Anesthetic complications: no   No notable events documented.   Last Vitals:  Vitals:   05/12/24 1124 05/12/24 1206  BP: 99/73   Pulse: (!) 103   Resp: 18   Temp: 37.8 C   SpO2: 94% 94%    Last Pain:  Vitals:   05/12/24 1423  TempSrc:   PainSc: 7                  Yvonna JINNY Bosworth

## 2024-05-12 NOTE — Hospital Course (Signed)
 53 year old female with PCOS, morbid obesity status post Roux-en-Y gastric bypass and laparoscopic gastric sleeve, fibromyalgia, chronic pain, opioid dependence, sleep apnea not on CPAP, GAD, degenerative arthritis of knees and shoulders, asthma, type 2 diabetes mellitus, GERD, gonalgia, hypothyroidism, HSV, difficult intubation, cephalgia who reports that she was treated for UTI about 5 days ago with a course of Augmentin .  About 1 week ago she was having symptoms of right lower quadrant abdominal pain and thought it was related to the UTI versus muscle strain.  She started having fever overnight.  Her temperature was reportedly 101.4 at home.  Patient was evaluated in the emergency department today and CT scanning demonstrates acute appendicitis.  She has been evaluated by the surgical team and they are planning to take her to the OR later today.  She is being admitted to the stepdown ICU bed due to history of difficult intubation, multiple medical comorbidities and need for close postoperative monitoring.

## 2024-05-12 NOTE — Transfer of Care (Signed)
 Immediate Anesthesia Transfer of Care Note  Patient: Karen Lewis  Procedure(s) Performed: APPENDECTOMY, ROBOT-ASSISTED, LAPAROSCOPIC  Patient Location: PACU and SICU  Anesthesia Type:General  Level of Consciousness: awake and alert   Airway & Oxygen Therapy: Patient Spontanous Breathing and Patient connected to face mask oxygen  Post-op Assessment: Report given to RN and Post -op Vital signs reviewed and stable  Post vital signs: Reviewed and stable  Last Vitals:  Vitals Value Taken Time  BP    Temp    Pulse    Resp    SpO2      Last Pain:  Vitals:   05/12/24 1423  TempSrc:   PainSc: 7          Complications: No notable events documented.

## 2024-05-12 NOTE — Op Note (Addendum)
 [facility] Operative Note  Preoperative diagnosis: Acute appendicitis  Postoperative diagnosis: Acute perforated appendicitis, ventral hernia containing omentum  Procedure: Robotic assisted laparoscopic appendectomy, lysis of adhesions  Anesthesia: General   Surgeon: Dorothyann Brittle, DO  Wound Classification: Dirty/Infected  Specimen: Appendix  Complications: None  Estimated Blood Loss: Minimal  Indications: Patient is a 53 y.o. female  presented with a 1 week history of right lower quadrant abdominal pain.  When it failed to improve, she decided to present to the emergency department.  CT of the abdomen and pelvis demonstrates acute appendicitis without evidence of perforation or abscess on imaging.  She has a leukocytosis of 21.8.  Decision was made to take the patient to the operating room for appendectomy. The risk of surgery were explained to the patient including but not limited to bleeding, infection, finding a rupture, injury to other organs, needing to do an open procedure.  We discussed that the patient did not want her umbilical hernia repaired in any fashion during the surgery.  FIndings: Acute perforated appendicitis noted Upon entering the abdomen (organ space), I encountered a phlegmon involving the appendix. Hemostasis noted at the completion of the case  Description of procedure: The patient was placed on the operating table in the supine position, left arm tucked. General anesthesia was induced. A time-out was completed verifying correct patient, procedure, site, positioning, and implant(s) and/or special equipment prior to beginning this procedure. The abdomen was prepped and draped in the usual sterile fashion.   At Palmer's point, an incision was made and Veress needle was inserted.  After confirming intraabdominal location with positive saline drop test and low insufflation pressures, gas insufflation was initiated until the abdominal  pressure was measured at 15 mmHg.  Afterwards, the Veress needle was removed and a 8 mm port was placed through the Palmer's point site using Optiview technique. No injuries were noted.  Two additional incisions were made 8 cm apart along the left side of the abdominal wall from the initial incision.  8 mm ports were then placed under direct visualization.  No injuries from trocar placements were noted. The table flexed and was placed in the Trendelenburg position with the right side elevated.  Xi robotic platform was then brought to the operative field and docked. A vessel sealer was placed through the Palmer's point port and a forced bipolar through the lower 8 mm port.   Prior to evaluating the appendix in the right lower quadrant, the patient was noted to have an ventral hernia containing omentum which was obscuring the view of the appendix.  Time was spent to reduce the omentum and lyse adhesions between the omentum and the hernia sac with vessel sealer.  Repair of the hernia was not performed, as patient expressed that she did not want her hernia repaired at the time of surgery.  Once the omentum was fully able to be reduced, attention was turned to the right lower quadrant.  Upon entering the abdomen (organ space), I encountered a phlegmon involving the appendix. An appendix appeared significantly inflamed with perforation, and it was identified and elevated.  Infection was present within the abdominal cavity due to appendicitis.  The vessel sealer was used to ligate the mesoappendix. A white load linear cutting stapler was inserted, and then used to divide and staple the base of the appendix.No bleeding from the staple lines noted.  The appendix was placed in an endoscopic retrieval bag and removed.   The appendiceal stump staple line was examined  again and hemostasis noted. No other pathology was identified within pelvis.  Given perforation noted during dissection, the right lower quadrant was irrigated  with saline.  The Palmer's point trocar was removed and port site closed with PMI using 0 vicryl under direct vision. Remaining trocars were removed. No bleeding was noted.The abdomen was allowed to collapse.  The incisions were localized with Marcaine .  All skin incisions then closed with skin staples.  Wounds then dressed with 2x2s and Tegaderm.  The patient tolerated the procedure well, awakened from anesthesia and was taken to the postanesthesia care unit in satisfactory condition.  Sponge count and instrument count correct at the end of the procedure.  Dorothyann Brittle, DO [facility] [address] [place], [place] [address] [phone] (office)

## 2024-05-12 NOTE — H&P (Signed)
 History and Physical  [facility]  Karen Lewis FMW:[id number] DOB: 1970-12-14 DOA: 05/12/2024  PCP: [name], MD  Patient coming from: Home  Level of care: Stepdown  I have personally briefly reviewed patient's old medical records in [facility]  Chief Complaint: RLQ abdominal pain   HPI: Karen Lewis is a 53 year old female with PCOS, morbid obesity status post Roux-en-Y gastric bypass and laparoscopic gastric sleeve, fibromyalgia, chronic pain, opioid dependence, sleep apnea not on CPAP, GAD, degenerative arthritis of knees and shoulders, asthma, type 2 diabetes mellitus, GERD, gonalgia, hypothyroidism, HSV, difficult intubation, cephalgia who reports that she was treated for UTI about 5 days ago with a course of Augmentin .  About 1 week ago she was having symptoms of right lower quadrant abdominal pain and thought it was related to the UTI versus muscle strain.  She started having fever overnight.  Her temperature was reportedly 101.4 at home.  Patient was evaluated in the emergency department today and CT scanning demonstrates acute appendicitis.  She has been evaluated by the surgical team and they are planning to take her to the OR later today.  She is being admitted to the stepdown ICU bed due to history of difficult intubation, multiple medical comorbidities and need for close postoperative monitoring.    Past Medical History:  Diagnosis Date   Anemia    Anginal pain (HCC)    Anxiety    Arthralgia of hip 07/29/2015   Arthritis    Arthritis, degenerative 07/29/2015   Asthma    Cephalalgia 07/25/2014   Dependence on unknown drug (HCC)    multiplt controlled drug dependence   Depression    Diabetes mellitus without complication (HCC)    Difficult intubation    per pt needs small tube (has had abrasions from tube in past)   Dysrhythmia    PVC's   Eczema    Fibromyalgia    Gastritis    GERD (gastroesophageal reflux disease)    Gonalgia 07/29/2015    Overview:  Overview:  The patient has had bilateral intra-articular Hyalgan injections done on 07/16/2014 and although she seems to do well with this type of therapy, apparently her insurance company does not want to pay for they Hyalgan. On 11/27/2014 the patient underwent a bilateral genicular nerve block with excellent results. On 01/28/2015 she had a right knee genicular radiofrequency ablatio   Gout    H/O cardiovascular disorder 03/10/2015   H/O surgical procedure 12/05/2012   Overview:  LSG ([facility] - April 2013)     H/O thyroid  disease 03/10/2015   Headache    Herpes    History of artificial joint 07/29/2015   History of hiatal hernia    Hypertension    Hypomagnesemia    Hypothyroidism    IDA (iron  deficiency anemia) 05/28/2019   LBP (low back pain) 07/29/2015   Neuromuscular disorder (HCC)    Obesity    PCOS (polycystic ovarian syndrome)    Primary osteoarthritis of both knees 07/29/2015   Sleep apnea    not using a Cpap machine at this time - most recent test mild apnea does not qualify for test (not OSA)   Thyroid  nodule bilateral   Umbilical hernia     Past Surgical History:  Procedure Laterality Date   APPENDECTOMY     CARPAL TUNNEL RELEASE Bilateral    CHOLECYSTECTOMY     COLONOSCOPY WITH PROPOFOL  N/A 02/11/2021   Procedure: COLONOSCOPY WITH PROPOFOL ;  Surgeon: Aundria, Teodoro K, MD;  Location:  [facility];  Service: Gastroenterology;  Laterality: N/A;   DIAGNOSTIC LAPAROSCOPY     ESOPHAGOGASTRODUODENOSCOPY (EGD) WITH PROPOFOL  N/A 02/11/2021   Procedure: ESOPHAGOGASTRODUODENOSCOPY (EGD) WITH PROPOFOL ;  Surgeon: Toledo, Ladell POUR, MD;  Location: [facility] ENDOSCOPY;  Service: Gastroenterology;  Laterality: N/A;   HIATAL HERNIA REPAIR     JOINT REPLACEMENT Bilateral hip   LAPAROSCOPIC PARTIAL GASTRECTOMY     left trigger finger     peniculectomy N/A 07/05/2018   ROUX-EN-Y GASTRIC BYPASS  06/03/2017   SHOULDER ARTHROSCOPY Right    THYROIDECTOMY N/A 11/12/2015   Procedure:  THYROIDECTOMY;  Surgeon: Deward Dolly, MD;  Location: [facility] ORS;  Service: ENT;  Laterality: N/A;   TOTAL HIP ARTHROPLASTY Right 11/27/2019   Procedure: TOTAL HIP ARTHROPLASTY ANTERIOR APPROACH;  Surgeon: Kathlynn Sharper, MD;  Location: [facility] ORS;  Service: Orthopedics;  Laterality: Right;   TRIGGER FINGER RELEASE Right    TRIGGER FINGER RELEASE Right 04/24/2020   Procedure: Right ring and middle trigger release;  Surgeon: Kathlynn Sharper, MD;  Location: [facility] ORS;  Service: Orthopedics;  Laterality: Right;     reports that she has never smoked. She has never used smokeless tobacco. She reports that she does not drink alcohol  and does not use drugs.  Allergies  Allergen Reactions   Bactrim [Sulfamethoxazole-Trimethoprim] Hives   Omalizumab Itching and Hives   Ciprofloxacin Other (See Comments)    myalgia    Shellfish Allergy Other (See Comments)    + positive allergy test   Nsaids Nausea Only and Other (See Comments)    Stomach upset    Family History  Problem Relation Age of Onset   Anxiety disorder Mother    Depression Mother    Alcohol  abuse Mother    Diabetes Mother    Hypertension Mother    Kidney cancer Mother    Sleep apnea Mother    Alcohol  abuse Father    Anxiety disorder Father    Depression Father    Post-traumatic stress disorder Father    Kidney failure Father    COPD Father    Diabetes Father    Hypertension Father    Sleep apnea Father    Depression Brother    Diabetes Brother    Hypertension Brother    Sleep apnea Brother    Breast cancer Paternal Aunt 37   Breast cancer Paternal Aunt 3   Brain cancer Paternal Uncle 61   Bladder Cancer Neg Hx    Prostate cancer Neg Hx     Prior to Admission medications   Medication Sig Start Date End Date Taking? Authorizing Provider  ACCU-CHEK AVIVA PLUS test strip  03/13/19   [provider]  acetaminophen  (TYLENOL ) 650 MG CR tablet Take 650-1,300 mg by mouth every 8 (eight) hours as needed for pain.     [provider]  acidophilus (RISAQUAD) CAPS capsule Take 1 capsule by mouth daily.    [provider]  albuterol  (PROVENTIL  HFA;VENTOLIN  HFA) 108 (90 BASE) MCG/ACT inhaler Inhale 2 puffs into the lungs every 6 (six) hours as needed for wheezing or shortness of breath.    [provider]  Alcohol  Swabs (ALCOHOL  PREP) 70 % PADS Apply topically. 01/03/23   [provider]  amitriptyline  (ELAVIL ) 75 MG tablet Take 100 mg by mouth at bedtime.    [provider]  Biotin  10 MG CAPS Take 10 mg by mouth daily.     [provider]  butalbital -acetaminophen -caffeine  (FIORICET ) 50-325-40 MG tablet Take 1 tablet by mouth every 6 (six)  hours as needed for migraine.  08/24/19   [provider]  calcium  carbonate (TUMS EX) 750 MG chewable tablet Chew 1 tablet by mouth daily.     [provider]  clonazePAM  (KLONOPIN ) 0.5 MG tablet Take 0.5 mg by mouth 2 (two) times daily as needed for anxiety. 06/24/22   [provider]  cyanocobalamin  (VITAMIN B12) 1000 MCG tablet Take 1,000 mcg by mouth 2 (two) times a week.    [provider]  diclofenac  sodium (VOLTAREN ) 1 % GEL Apply 2 g topically 4 (four) times daily. 05/29/19   Cleotilde Rogue, MD  diphenhydrAMINE  (BENADRYL ) 25 MG tablet Take 25 mg by mouth every 8 (eight) hours as needed for itching or allergies.     [provider]  DULoxetine  (CYMBALTA ) 60 MG capsule Take 60 mg by mouth daily. 10/19/22   [provider]  Dupilumab 300 MG/2ML SOAJ Inject 300 mg into the skin every 14 (fourteen) days. 01/02/24   [provider]  EPINEPHrine  0.3 mg/0.3 mL IJ SOAJ injection Inject 0.3 mg into the muscle as needed for anaphylaxis.    [provider]  ergocalciferol  (VITAMIN D2) 1.25 MG (50000 UT) capsule Take 50,000 Units by mouth 2 (two) times a week.  10/25/19   [provider]  estradiol  (CLIMARA  - DOSED IN MG/24 HR) 0.05 mg/24hr patch Place 0.05 mg  onto the skin once a week.    [provider]  estradiol  (ESTRACE ) 0.1 MG/GM vaginal cream Place 1 g vaginally. 03/12/24 06/10/24  [provider]  famotidine  (PEPCID ) 20 MG tablet Take 20 mg by mouth 2 (two) times daily.    [provider]  Galcanezumab-gnlm (EMGALITY) 120 MG/ML SOAJ Inject 120 mg into the skin every 30 (thirty) days.    [provider]  glucose blood (ACCU-CHEK AVIVA PLUS) test strip Use 2 (two) times daily 09/29/18   [provider]  hydrochlorothiazide (HYDRODIURIL) 25 MG tablet Take 25 mg by mouth daily.    [provider]  levocetirizine (XYZAL ) 5 MG tablet Take 5 mg by mouth at bedtime.  09/12/19   [provider]  levothyroxine  (SYNTHROID ) 125 MCG tablet Take 125 mcg by mouth every morning. 11/07/23   [provider]  lisinopril  (ZESTRIL ) 5 MG tablet Take 5 mg by mouth daily.    [provider]  magnesium  oxide (MAG-OX) 400 MG tablet Take 1,200 mg by mouth 2 (two) times daily. Take 1200 mg by mouth in the morning and 1200 mg at bedtime 10/24/19   [provider]  meclizine  (ANTIVERT ) 25 MG tablet Take 25 mg by mouth 3 (three) times daily as needed for dizziness.  09/19/19   [provider]  metFORMIN  (GLUCOPHAGE -XR) 500 MG 24 hr tablet Take 1,000 mg by mouth 2 (two) times daily.    [provider]  metoprolol  tartrate (LOPRESSOR ) 50 MG tablet Take 50 mg by mouth in the morning, at noon, and at bedtime. 50 mg TID and an additonal 50mg  if needed 01/04/23   [provider]  montelukast  (SINGULAIR ) 10 MG tablet Take 10 mg by mouth at bedtime.     [provider]  Multiple Vitamin (TAB-A-VITE) TABS Take 1 tablet by mouth daily. 07/15/20   [provider]  naloxone  (NARCAN ) nasal spray 4 mg/0.1 mL Place 1 spray into the nose as needed for up to 365 doses (for opioid-induced respiratory depresssion). In case of emergency (overdose), spray once into each  nostril. If no response within 3 minutes, repeat  application and call 911. 09/26/23 09/25/24  Tanya Glisson, MD  naphazoline-pheniramine (NAPHCON-A) 0.025-0.3 % ophthalmic solution Place 1 drop into both eyes 4 (four) times daily as needed for eye irritation.    [provider]  nitrofurantoin , macrocrystal-monohydrate, (MACROBID ) 100 MG capsule Take 1 capsule (100 mg total) by mouth at bedtime. 02/28/24   Gaston Hamilton, MD  nystatin  (MYCOSTATIN /NYSTOP ) powder Apply topically 2 (two) times daily. 07/01/23 06/30/24  [provider]  omeprazole (PRILOSEC) 40 MG capsule Take 40 mg by mouth in the morning and at bedtime.    [provider]  oxyCODONE  (OXY IR/ROXICODONE ) 5 MG immediate release tablet Take 1 tablet (5 mg total) by mouth every 8 (eight) hours as needed for severe pain (pain score 7-10). Must last 30 days 04/19/24 05/19/24  Patel, Seema K, NP  oxyCODONE  (OXY IR/ROXICODONE ) 5 MG immediate release tablet Take 1 tablet (5 mg total) by mouth every 8 (eight) hours as needed for severe pain (pain score 7-10). Must last 30 days 05/19/24 06/18/24  Patel, Seema K, NP  oxyCODONE  (OXY IR/ROXICODONE ) 5 MG immediate release tablet Take 1 tablet (5 mg total) by mouth every 8 (eight) hours as needed for severe pain (pain score 7-10). Must last 30 days 06/18/24 07/18/24  Patel, Seema K, NP  OZEMPIC, 0.25 OR 0.5 MG/DOSE, 2 MG/1.5ML SOPN Inject 1 mg into the skin once a week. 07/17/20   [provider]  phentermine (ADIPEX-P) 37.5 MG tablet Take 37.5 mg by mouth daily before breakfast.    [provider]  pregabalin  (LYRICA ) 225 MG capsule Take 225 mg by mouth 2 (two) times daily.    [provider]  progesterone  (PROMETRIUM ) 100 MG capsule Take 100 mg by mouth daily.    [provider]  QUEtiapine  (SEROQUEL ) 50 MG tablet Take 50 mg by mouth at bedtime. 09/15/23   [provider]  rizatriptan (MAXALT) 10 MG tablet Take 1 and may repeat in 2 hours  for MIgraines, alternate with the disintrgrating tablet. 09/04/20   [provider]  rizatriptan (MAXALT-MLT) 10 MG disintegrating tablet Take 10 mg by mouth. Take 1 and may repeat in 2 hours  for Migraines 08/16/20   [provider]  rosuvastatin  (CRESTOR ) 40 MG tablet Take 40 mg by mouth daily.  02/28/19   [provider]  tiZANidine  (ZANAFLEX ) 4 MG tablet Take 1 tablet (4 mg total) by mouth 2 (two) times daily as needed for muscle spasms. 11/30/19   Charlene Debby BROCKS, PA-C  topiramate  (TOPAMAX ) 100 MG tablet Take 100 mg by mouth 2 (two) times daily.    [provider]    Physical Exam: Vitals:   05/12/24 1124 05/12/24 1124 05/12/24 1206  BP:  99/73   Pulse:  (!) 103   Resp:  18   Temp:  100.1 F (37.8 C)   TempSrc:  Oral   SpO2:  94% 94%  Weight: (!) 152.9 kg    Height: 5' 3 (1.6 m)      Constitutional: morbidly obese famale sitting in chair, awake, alert, uncomfortable, NAD, calm.  Eyes: PERRL, lids and conjunctivae normal ENMT: Mucous membranes are dr. Posterior pharynx clear of any exudate or lesions. Normal dentition.  Neck: normal, supple, no masses, no thyromegaly Respiratory: clear to auscultation bilaterally, no wheezing, no crackles. Normal respiratory effort. No accessory muscle use.  Cardiovascular: normal s1, s2 sounds, no murmurs / rubs / gallops. No extremity edema. 2+ pedal pulses. No carotid bruits.  Abdomen: diffuse RLQ and epigastric  tenderness, abd obese. Bowel sounds positive.  Musculoskeletal: no clubbing / cyanosis. No joint deformity upper and lower extremities. Good ROM, no contractures. Normal muscle tone.  Skin: no rashes, lesions, ulcers. No induration Neurologic: CN 2-12 grossly intact. Sensation intact, DTR normal. Strength 5/5 in all 4.  Psychiatric: Normal judgment and insight. Alert and oriented x 3. Normal mood.   Labs on Admission: I have personally reviewed following labs and imaging studies  CBC: Recent Labs   Lab 05/12/24 1125  WBC 21.8*  HGB 13.6  HCT 41.3  MCV 89.6  PLT 300   Basic Metabolic Panel: Recent Labs  Lab 05/12/24 1125  NA 136  K 4.0  CL 102  CO2 22  GLUCOSE 150*  BUN 19  CREATININE 1.28*  CALCIUM  8.4*   GFR: Estimated Creatinine Clearance: 74.3 mL/min (A) (by C-G formula based on SCr of 1.28 mg/dL (H)). Liver Function Tests: Recent Labs  Lab 05/12/24 1125  AST 26  ALT 39  ALKPHOS 79  BILITOT 0.7  PROT 7.4  ALBUMIN 3.7   Recent Labs  Lab 05/12/24 1125  LIPASE 40   No results for input(s): AMMONIA in the last 168 hours. Coagulation Profile: No results for input(s): INR, PROTIME in the last 168 hours. Cardiac Enzymes: No results for input(s): CKTOTAL, CKMB, CKMBINDEX, TROPONINI in the last 168 hours. BNP (last 3 results) No results for input(s): PROBNP in the last 8760 hours. HbA1C: No results for input(s): HGBA1C in the last 72 hours. CBG: No results for input(s): GLUCAP in the last 168 hours. Lipid Profile: No results for input(s): CHOL, HDL, LDLCALC, TRIG, CHOLHDL, LDLDIRECT in the last 72 hours. Thyroid  Function Tests: No results for input(s): TSH, T4TOTAL, FREET4, T3FREE, THYROIDAB in the last 72 hours. Anemia Panel: No results for input(s): VITAMINB12, FOLATE, FERRITIN, TIBC, IRON , RETICCTPCT in the last 72 hours. Urine analysis:    Component Value Date/Time   COLORURINE YELLOW 05/12/2024 1151   APPEARANCEUR CLEAR 05/12/2024 1151   APPEARANCEUR Hazy (A) 03/14/2023 1309   LABSPEC 1.012 05/12/2024 1151   LABSPEC 1.003 03/25/2013 2018   PHURINE 7.0 05/12/2024 1151   GLUCOSEU NEGATIVE 05/12/2024 1151   GLUCOSEU Negative 03/25/2013 2018   HGBUR NEGATIVE 05/12/2024 1151   BILIRUBINUR NEGATIVE 05/12/2024 1151   BILIRUBINUR Negative 03/14/2023 1309   BILIRUBINUR Negative 03/25/2013 2018   KETONESUR NEGATIVE 05/12/2024 1151   PROTEINUR NEGATIVE 05/12/2024 1151   UROBILINOGEN 0.2  08/04/2020 1345   UROBILINOGEN 0.2 05/09/2015 0115   NITRITE NEGATIVE 05/12/2024 1151   LEUKOCYTESUR NEGATIVE 05/12/2024 1151   LEUKOCYTESUR 2+ 03/25/2013 2018    Radiological Exams on Admission: CT ABDOMEN PELVIS W CONTRAST Result Date: 05/12/2024 EXAM: CT ABDOMEN AND PELVIS WITH CONTRAST 05/12/2024 01:20:37 PM TECHNIQUE: CT of the abdomen and pelvis was performed with the administration of 100 mL of intravenous iohexol  (OMNIPAQUE ) 300 MG/ML solution. Multiplanar reformatted images are provided for review. Automated exposure control, iterative reconstruction, and/or weight-based adjustment of the mA/kV was utilized to reduce the radiation dose to as low as reasonably achievable. COMPARISON: 04/28/2021 CLINICAL HISTORY: Abdominal pain, acute, nonlocalized; right lower abdominal pain that radiates to right flank. Patient states she is currently being treated for a UTI and started a fever last night of 101.4. Patient states folding up a scooter and was worried about pulling a muscle. FINDINGS: LOWER CHEST: Moderate coronary calcification. Mild   aortic plaque. LIVER: Normal size and contour. GALLBLADDER AND BILE DUCTS: Gallbladder not identified. SPLEEN: Normal size. No focal lesion. PANCREAS: No mass.  No ductal dilatation. ADRENAL GLANDS: Normal appearance. No mass. KIDNEYS, URETERS AND BLADDER: No stones in the kidneys or ureters. No hydronephrosis. No perinephric or periureteral stranding. Urinary bladder is unremarkable. GI AND BOWEL: Post gastric bypass surgery. Dilated fluid-filled appendix containing 2 calcified appendicoliths, with some mucosal enhancement. No definite perforation or abscess. Significant regional inflammatory changes however. Stomach demonstrates no acute abnormality. There is no bowel obstruction. No bowel wall thickening. PERITONEUM AND RETROPERITONEUM: No ascites. No free air. VASCULATURE: Infrarenal IVC filter with caval wall perforation by several legs, no definite adjacent organ  involvement. Aorta is normal in caliber. LYMPH NODES: No lymphadenopathy. REPRODUCTIVE ORGANS: No significant abnormality. BONES AND SOFT TISSUES: bilateral hip arthroplasty. Moderate umbilical hernia containing mesenteric fat. Vertebral endplate spurring at multiple levels in the lower thoracic spine. Mild spondylitic changes in the lumbar spine. No acute osseous abnormality. No focal soft tissue abnormality. IMPRESSION: 1. Acute appendicitis. No definite perforation or abscess. Findings telephoned to Dr. Melvenia at time of interpretation. 2. Moderate umbilical hernia containing mesenteric fat. 3. Infrarenal IVC filter with caval wall perforation by several legs, without definite adjacent organ involvement. Electronically signed by: Katheleen Faes MD 05/12/2024 01:50 PM EDT RP Workstation: [id number]    EKG: pending  Assessment/Plan Principal Problem:   Acute appendicitis Active Problems:   Leukocytosis   Colicky RLQ abdominal pain   Fever, unspecified   Binge eating disorder   Fibromyalgia   Anxiety, generalized   Uncomplicated asthma   Bilateral polycystic ovarian syndrome   Apnea, sleep   Type 2 diabetes mellitus without complication, without long-term current use of insulin  (HCC)   Class 3 severe obesity due to excess calories with serious comorbidity and body mass index (BMI) of 60.0 to 69.9 in adult   Chronic pain syndrome   Controlled drug dependence (HCC)   Allergy to iodine   Adult failure to thrive syndrome   Uncomplicated opioid dependence (HCC)   Barrett's esophagus without dysplasia   Chronic shoulder pain (Right)   Essential (primary) hypertension   Difficult intubation   Acute appendicitis  -- appreciate surgical team consultation and recommendations -- pt going to OR later today with [name] -- continue IV zosyn  -- follow blood culture -- NPO pending surgery -- IV pain and nausea medications -- IV fluids for preop hydration  -- preop EKG pending -- agree  with stepdown ICU bed for postop following given history of difficult intubation -- IV pantoprazole  for GI protection   Type 2 diabetes mellitus with neurological complications -- NPO now pending surgery -- CBG every 4 hours with SSI coverage for now -- follow up A1c test  Diabetic neuropathy -- resume home pregabalin  when meds reconciled and able to take p.o.  Sleep Apnea -- reportedly not on CPAP -- ordered CPAP autotitrated while inpatient  Chronic Pain Opioid dependence Fibromyalgia  -- IV pain management for now  Essential hypertension  -- resume home meds when reconciled and able to take p.o.  -- IV hydralazine  ordered PRN for elevated BPs  Hyperlipidemia -- resume home rosuvastatin  when reconciled  Leukocytosis -- secondary to acute appendicitis -- follow up blood culture -- continue IV antibiotics -- follow blood culture, lactic acid is reassuring at 1.0 -- repeat CBC with diff in AM after surgery   Class 3 obesity  -- s/p Roux-en-Y Gastric bypass and gastric sleeve procedure -- has been on semaglutide -- she is working closely with her PCP on weight loss journey -- supportive management for now while inpatient  DVT prophylaxis: enoxaparin  starting 8/31  Code Status: Full   Family Communication:   Disposition Plan: anticipate home   Consults called: surgery   Admission status: OBV Time spent: 63 mins   Level of care: Stepdown Afton Louder MD [facility] How to contact the [facility] [place] Attending or Consulting provider 7A - 7P or covering provider during after hours 7P -7A, for this patient?  Check the care team in [facility] and look for a) attending/consulting [facility] provider listed and b) the [facility] team listed Log into [email or web address] and use [place]'s universal password to access. If you do not have the password, please contact the hospital operator. Locate the [facility] provider you are looking for under [facility] and page to a number that you can be  directly reached. If you still have difficulty reaching the provider, please page the [facility] (Director on Call) for the Hospitalists listed on amion for assistance.   If 7PM-7AM, please contact night-coverage [email or web address] Password [email or web address]  05/12/2024, 2:45 PM

## 2024-05-12 NOTE — Anesthesia Procedure Notes (Signed)
 Procedure Name: Intubation Date/Time: 05/12/2024 3:07 PM  Performed by: Kendell Yvonna PARAS, MDPre-anesthesia Checklist: Patient identified, Emergency Drugs available, Suction available, Patient being monitored and Timeout performed Patient Re-evaluated:Patient Re-evaluated prior to induction Oxygen Delivery Method: Circle system utilized Preoxygenation: Pre-oxygenation with 100% oxygen Induction Type: IV induction Laryngoscope Size: Glidescope and 3 Grade View: Grade I Tube type: Oral Tube size: 6.5 mm Number of attempts: 1 Airway Equipment and Method: Video-laryngoscopy and Stylet Placement Confirmation: ETT inserted through vocal cords under direct vision, positive ETCO2 and breath sounds checked- equal and bilateral Secured at: 22 cm Tube secured with: Tape Dental Injury: Teeth and Oropharynx as per pre-operative assessment

## 2024-05-12 NOTE — ED Provider Notes (Signed)
 [place] EMERGENCY DEPARTMENT AT [facility] Provider Note   CSN: [id number] Arrival date & time: 05/12/24  1042     Patient presents with: Abdominal Pain   Karen Lewis is a 53 y.o. female.    Abdominal Pain Associated symptoms: fatigue and fever   Patient presents for abdominal pain.  Medical history includes fibromyalgia, opiate dependence, chronic pain, arthritis, asthma, sleep apnea, GERD, depression.  Prior surgical history includes cholecystectomy, gastric sleeve.  For the past week, patient has had a right lower quadrant abdominal pain.  She was seen as outpatient and started on Augmentin  for treatment of UTI.  She is currently on day 5 of Augmentin .  She has had ongoing pain.  She developed fever last night.  Last dose of Tylenol  was 10 AM.  Current pain is 7/10 in severity.  She denies any recent vaginal discharge.     Prior to Admission medications   Medication Sig Start Date End Date Taking? Authorizing Provider  albuterol  (PROVENTIL  HFA;VENTOLIN  HFA) 108 (90 BASE) MCG/ACT inhaler Inhale 2 puffs into the lungs every 6 (six) hours as needed for wheezing or shortness of breath.   Yes [provider]  amitriptyline  (ELAVIL ) 100 MG tablet Take 100 mg by mouth at bedtime. 03/28/24  Yes [provider]  amoxicillin -clavulanate (AUGMENTIN ) 875-125 MG tablet Take 875 mg by mouth 2 (two) times daily. 10 day course starting on 05/07/2024 05/07/24 05/17/24 Yes [provider]  cephALEXin  (KEFLEX ) 500 MG capsule Take 500 mg by mouth 4 (four) times daily. 3 day course starting on 05/03/2024 05/03/24  Yes [provider]  clonazePAM  (KLONOPIN ) 0.5 MG tablet Take 0.5 mg by mouth 2 (two) times daily as needed for anxiety. 06/24/22  Yes [provider]  DULoxetine  (CYMBALTA ) 60 MG capsule Take 60 mg by mouth daily. 10/19/22  Yes [provider]  EPINEPHrine  0.3 mg/0.3 mL IJ SOAJ injection Inject 0.3 mg into the muscle as needed for  anaphylaxis.   Yes [provider]  ergocalciferol  (VITAMIN D2) 1.25 MG (50000 UT) capsule Take 50,000 Units by mouth 2 (two) times a week. Mondays and Fridays 10/25/19  Yes [provider]  estradiol  (CLIMARA  - DOSED IN MG/24 HR) 0.05 mg/24hr patch Place 0.05 mg onto the skin once a week.   Yes [provider]  fluconazole  (DIFLUCAN ) 150 MG tablet Take 150 mg by mouth once. 05/07/24  Yes [provider]  fluticasone  furoate-vilanterol (BREO ELLIPTA ) 200-25 MCG/ACT AEPB Inhale 1 puff into the lungs in the morning and at bedtime. 04/26/24  Yes [provider]  hydrochlorothiazide (HYDRODIURIL) 25 MG tablet Take 25 mg by mouth daily.   Yes [provider]  levocetirizine (XYZAL ) 5 MG tablet Take 5 mg by mouth at bedtime.  09/12/19  Yes [provider]  levothyroxine  (SYNTHROID ) 125 MCG tablet Take 125 mcg by mouth every morning. 11/07/23  Yes [provider]  lisinopril  (ZESTRIL ) 5 MG tablet Take 5 mg by mouth daily.   Yes [provider]  magnesium  oxide (MAG-OX) 400 MG tablet Take 1,200 mg by mouth 2 (two) times daily. Take 1200 mg by mouth in the morning and 1200 mg at bedtime 10/24/19  Yes [provider]  meclizine  (ANTIVERT ) 25 MG tablet Take 25 mg by mouth 3 (three) times daily as needed for dizziness.  09/19/19  Yes [provider]  metFORMIN  (GLUCOPHAGE -XR) 500 MG 24 hr tablet Take 1,000 mg by mouth 2 (two) times daily.   Yes [provider]  metoprolol  tartrate (LOPRESSOR ) 50 MG tablet Take 50 mg by mouth in the morning, at noon, and at bedtime. May take an additonal 50mg  if needed 01/04/23  Yes [provider]  montelukast  (SINGULAIR ) 10 MG tablet Take 10 mg by mouth at bedtime.    Yes [provider]  Multiple Vitamin (TAB-A-VITE) TABS Take 1 tablet by mouth daily. 07/15/20  Yes [provider]  naloxone  (NARCAN ) nasal spray 4 mg/0.1 mL Place 1 spray into the nose as  needed for up to 365 doses (for opioid-induced respiratory depresssion). In case of emergency (overdose), spray once into each nostril. If no response within 3 minutes, repeat application and call 911. 09/26/23 09/25/24 Yes Tanya Glisson, MD  nitrofurantoin , macrocrystal-monohydrate, (MACROBID ) 100 MG capsule Take 1 capsule (100 mg total) by mouth at bedtime. 02/28/24  Yes MacDiarmid, Glendia, MD  omeprazole (PRILOSEC) 40 MG capsule Take 40 mg by mouth in the morning and at bedtime.   Yes [provider]  oxyCODONE  (OXY IR/ROXICODONE ) 5 MG immediate release tablet Take 1 tablet (5 mg total) by mouth every 8 (eight) hours as needed for severe pain (pain score 7-10). Must last 30 days 04/19/24 05/19/24 Yes Patel, Seema K, NP  pregabalin  (LYRICA ) 225 MG capsule Take 225 mg by mouth 2 (two) times daily.   Yes [provider]  progesterone  (PROMETRIUM ) 100 MG capsule Take 100 mg by mouth daily.   Yes [provider]  QUEtiapine  (SEROQUEL ) 50 MG tablet Take 50 mg by mouth at bedtime. 09/15/23  Yes [provider]  rizatriptan (MAXALT) 10 MG tablet Take 10 mg by mouth as needed for migraine. Take 1 and may repeat in 2 hours for MIgraines, alternate with the disintrgrating tablet. 09/04/20  Yes [provider]  rosuvastatin  (CRESTOR ) 40 MG tablet Take 40 mg by mouth daily.  02/28/19  Yes [provider]  tiZANidine  (ZANAFLEX ) 4 MG tablet Take 1 tablet (4 mg total) by mouth 2 (two) times daily as needed for muscle spasms. 11/30/19  Yes Charlene Debby BROCKS, PA-C  topiramate  (TOPAMAX ) 100 MG tablet Take 100 mg by mouth 2 (two) times daily.   Yes [provider]  acetaminophen  (TYLENOL ) 650 MG CR tablet Take 650-1,300 mg by mouth every 8 (eight) hours as needed for pain.    [provider]  acidophilus (RISAQUAD) CAPS capsule Take 1 capsule by mouth daily.    [provider]  Biotin  10 MG CAPS Take 10 mg by mouth daily.     [provider]  butalbital -acetaminophen -caffeine  (FIORICET ) 50-325-40 MG tablet Take 1 tablet by mouth every 6 (six) hours as needed for migraine.  08/24/19   [provider]  calcium  carbonate (TUMS EX) 750 MG chewable tablet Chew 1 tablet by mouth daily.     [provider]  cyanocobalamin  (VITAMIN B12) 1000 MCG tablet Take 1,000 mcg by mouth 2 (two) times a week.    [provider]  diclofenac  sodium (VOLTAREN ) 1 % GEL Apply 2 g topically 4 (four) times daily. 05/29/19   Cleotilde Rogue, MD  diphenhydrAMINE  (BENADRYL ) 25 MG tablet Take 25 mg by mouth every 8 (eight) hours as needed for itching or allergies.     [provider]  Dupilumab 300 MG/2ML SOAJ Inject 300 mg into the skin every 14 (fourteen) days. 01/02/24   [provider]  estradiol  (ESTRACE ) 0.1 MG/GM vaginal cream Place 1 g vaginally. 03/12/24 06/10/24  [provider]  famotidine  (PEPCID ) 20 MG  tablet Take 20 mg by mouth 2 (two) times daily.    [provider]  Galcanezumab-gnlm (EMGALITY) 120 MG/ML SOAJ Inject 120 mg into the skin every 30 (thirty) days.    [provider]  naphazoline-pheniramine (NAPHCON-A) 0.025-0.3 % ophthalmic solution Place 1 drop into both eyes 4 (four) times daily as needed for eye irritation.    [provider]  nystatin  (MYCOSTATIN /NYSTOP ) powder Apply topically 2 (two) times daily. 07/01/23 06/30/24  [provider]  oxyCODONE  (OXY IR/ROXICODONE ) 5 MG immediate release tablet Take 1 tablet (5 mg total) by mouth every 8 (eight) hours as needed for severe pain (pain score 7-10). Must last 30 days 05/19/24 06/18/24  Patel, Seema K, NP  oxyCODONE  (OXY IR/ROXICODONE ) 5 MG immediate release tablet Take 1 tablet (5 mg total) by mouth every 8 (eight) hours as needed for severe pain (pain score 7-10). Must last 30 days 06/18/24 07/18/24  Patel, Seema K, NP  OZEMPIC, 0.25 OR 0.5 MG/DOSE, 2 MG/1.5ML SOPN Inject 1 mg into the skin once a week. 07/17/20    [provider]  phentermine (ADIPEX-P) 37.5 MG tablet Take 37.5 mg by mouth daily before breakfast.    [provider]    Allergies: Bactrim [sulfamethoxazole-trimethoprim], Omalizumab, Ciprofloxacin, Shellfish allergy, and Nsaids    Review of Systems  Constitutional:  Positive for appetite change, fatigue and fever.  Gastrointestinal:  Positive for abdominal pain.  All other systems reviewed and are negative.   Updated Vital Signs BP 99/73 (BP Location: Right Wrist)   Pulse (!) 103   Temp 100.1 F (37.8 C) (Oral)   Resp 18   Ht 5' 3 (1.6 m)   Wt (!) 152.9 kg   SpO2 94%   BMI 59.70 kg/m   Physical Exam Vitals and nursing note reviewed.  Constitutional:      General: She is not in acute distress.    Appearance: She is well-developed. She is not ill-appearing, toxic-appearing or diaphoretic.  HENT:     Head: Normocephalic and atraumatic.  Eyes:     Conjunctiva/sclera: Conjunctivae normal.  Cardiovascular:     Rate and Rhythm: Regular rhythm. Tachycardia present.     Heart sounds: No murmur heard. Pulmonary:     Effort: Pulmonary effort is normal. No respiratory distress.     Breath sounds: Normal breath sounds.  Abdominal:     Palpations: Abdomen is soft.     Tenderness: There is abdominal tenderness in the right lower quadrant. There is no guarding or rebound.  Musculoskeletal:        General: No swelling.     Cervical back: Neck supple.  Skin:    General: Skin is warm and dry.  Neurological:     General: No focal deficit present.     Mental Status: She is alert and oriented to person, place, and time.  Psychiatric:        Mood and Affect: Mood normal.        Behavior: Behavior normal.     (all labs ordered are listed, but only abnormal results are displayed) Labs Reviewed  COMPREHENSIVE METABOLIC PANEL WITH GFR - Abnormal; Notable for the following components:      Result Value   Glucose, Bld 150 (*)    Creatinine, Ser 1.28 (*)     Calcium  8.4 (*)    GFR, Estimated 50 (*)    All other components within normal limits  CBC - Abnormal; Notable for the following components:   WBC 21.8 (*)  All other components within normal limits  CULTURE, BLOOD (ROUTINE X 2)  LIPASE, BLOOD  URINALYSIS, ROUTINE W REFLEX MICROSCOPIC  LACTIC ACID, PLASMA  HEMOGLOBIN A1C  HIV ANTIBODY (ROUTINE TESTING W REFLEX)  SURGICAL PATHOLOGY    EKG: None  Radiology: US  PELVIC COMPLETE W TRANSVAGINAL AND TORSION R/O Result Date: 05/12/2024 EXAM: US  Pelvis, Complete Transvaginal and Transabdominal without Doppler TECHNIQUE: Transabdominal and transvaginal pelvic duplex ultrasound using B-mode/gray scaled imaging without Doppler spectral analysis and color flow was obtained. COMPARISON: None provided CLINICAL HISTORY: Pelvic pain. Additional clinical history of acute appendicitis on CT from earlier today. FINDINGS: UTERUS: Small anteverted uterus measures 3.9 x 1.7 x 2.5 cm, uterus volume 9.4 cc. No uterine fibroids. ENDOMETRIAL STRIPE: Bilayer endometrial thickness 1 mm. No focal endometrial mass. Trace fluid in the fundal endometrial cavity. RIGHT OVARY: Right ovary measures 2.6 x 1.2 x 1.2 cm and is normal with normal arterial and venous flow on color and spectral Doppler. No adnexal mass. LEFT OVARY: Left ovary not discretely visualized. No adnexal mass. FREE FLUID: No significant free fluid in the pelvic cul-de-sac. IMPRESSION: 1. No acute findings. 2. Normal right ovary. Nonvisualization of left ovary. No adnexal masses. 3. Small anteverted uterus with trace fluid in the fundal endometrial cavity. Normal endometrial thickness. Electronically signed by: Selinda Blue MD 05/12/2024 03:08 PM EDT RP Workstation: [id number]   CT ABDOMEN PELVIS W CONTRAST Result Date: 05/12/2024 EXAM: CT ABDOMEN AND PELVIS WITH CONTRAST 05/12/2024 01:20:37 PM TECHNIQUE: CT of the abdomen and pelvis was performed with the administration of 100 mL of intravenous iohexol   (OMNIPAQUE ) 300 MG/ML solution. Multiplanar reformatted images are provided for review. Automated exposure control, iterative reconstruction, and/or weight-based adjustment of the mA/kV was utilized to reduce the radiation dose to as low as reasonably achievable. COMPARISON: 04/28/2021 CLINICAL HISTORY: Abdominal pain, acute, nonlocalized; right lower abdominal pain that radiates to right flank. Patient states she is currently being treated for a UTI and started a fever last night of 101.4. Patient states folding up a scooter and was worried about pulling a muscle. FINDINGS: LOWER CHEST: Moderate coronary calcification. Mild   aortic plaque. LIVER: Normal size and contour. GALLBLADDER AND BILE DUCTS: Gallbladder not identified. SPLEEN: Normal size. No focal lesion. PANCREAS: No mass. No ductal dilatation. ADRENAL GLANDS: Normal appearance. No mass. KIDNEYS, URETERS AND BLADDER: No stones in the kidneys or ureters. No hydronephrosis. No perinephric or periureteral stranding. Urinary bladder is unremarkable. GI AND BOWEL: Post gastric bypass surgery. Dilated fluid-filled appendix containing 2 calcified appendicoliths, with some mucosal enhancement. No definite perforation or abscess. Significant regional inflammatory changes however. Stomach demonstrates no acute abnormality. There is no bowel obstruction. No bowel wall thickening. PERITONEUM AND RETROPERITONEUM: No ascites. No free air. VASCULATURE: Infrarenal IVC filter with caval wall perforation by several legs, no definite adjacent organ involvement. Aorta is normal in caliber. LYMPH NODES: No lymphadenopathy. REPRODUCTIVE ORGANS: No significant abnormality. BONES AND SOFT TISSUES: bilateral hip arthroplasty. Moderate umbilical hernia containing mesenteric fat. Vertebral endplate spurring at multiple levels in the lower thoracic spine. Mild spondylitic changes in the lumbar spine. No acute osseous abnormality. No focal soft tissue abnormality. IMPRESSION: 1.  Acute appendicitis. No definite perforation or abscess. Findings telephoned to Dr. Melvenia at time of interpretation. 2. Moderate umbilical hernia containing mesenteric fat. 3. Infrarenal IVC filter with caval wall perforation by several legs, without definite adjacent organ involvement. Electronically signed by: Dayne Hassell MD 05/12/2024 01:50 PM EDT RP Workstation: [id number]     Procedures   Medications  Ordered in the ED  insulin  aspart (novoLOG ) injection 0-15 Units ( Subcutaneous Automatically Held 05/20/24 1700)  lactated ringers  infusion ( Intravenous New Bag/Given 05/12/24 1607)  acetaminophen  (TYLENOL ) tablet 650 mg ( Oral MAR Hold 05/12/24 1459)    Or  acetaminophen  (TYLENOL ) suppository 650 mg ( Rectal MAR Hold 05/12/24 1459)  oxyCODONE  (Oxy IR/ROXICODONE ) immediate release tablet 10 mg ( Oral MAR Hold 05/12/24 1459)  bisacodyl  (DULCOLAX) EC tablet 5 mg ( Oral MAR Hold 05/12/24 1459)  ondansetron  (ZOFRAN ) tablet 4 mg ( Oral MAR Hold 05/12/24 1459)    Or  ondansetron  (ZOFRAN ) injection 4 mg ( Intravenous MAR Hold 05/12/24 1459)  prochlorperazine  (COMPAZINE ) injection 10 mg ( Intravenous MAR Hold 05/12/24 1459)  HYDROmorphone  (DILAUDID ) injection 0.5 mg ( Intravenous MAR Hold 05/12/24 1459)  piperacillin -tazobactam (ZOSYN ) IVPB 3.375 g ( Intravenous Automatically Held 05/20/24 2200)  enoxaparin  (LOVENOX ) injection 75 mg ( Subcutaneous Automatically Held 05/21/24 1000)  pantoprazole  (PROTONIX ) injection 40 mg ( Intravenous Automatically Held 05/20/24 1500)  hydrALAZINE  (APRESOLINE ) injection 10 mg (has no administration in time range)  bupivacaine (PF) (MARCAINE ) 0.5 % injection (30 mLs Infiltration Given 05/12/24 1601)  sterile water  for irrigation (1,000 mLs Irrigation Given 05/12/24 1601)  sodium chloride  irrigation 0.9 % (3,000 mLs Irrigation Given 05/12/24 1602)  lactated ringers  bolus 1,000 mL (1,000 mLs Intravenous New Bag/Given 05/12/24 1259)  oxyCODONE -acetaminophen  (PERCOCET/ROXICET) 5-325 MG  per tablet 1 tablet (1 tablet Oral Given 05/12/24 1259)  iohexol  (OMNIPAQUE ) 300 MG/ML solution 100 mL (100 mLs Intravenous Contrast Given 05/12/24 1309)  ketorolac  (TORADOL ) 15 MG/ML injection 15 mg (15 mg Intravenous Given 05/12/24 1327)  piperacillin -tazobactam (ZOSYN ) IVPB 3.375 g (3.375 g Intravenous New Bag/Given 05/12/24 1423)                                    Medical Decision Making Amount and/or Complexity of Data Reviewed Labs: ordered. Radiology: ordered.  Risk Prescription drug management. Decision regarding hospitalization.   This patient presents to the ED for concern of abdominal pain, this involves an extensive number of treatment options, and is a complaint that carries with it a high risk of complications and morbidity.  The differential diagnosis includes appendicitis, colitis, IBD, PID, ovarian torsion   Co morbidities / Chronic conditions that complicate the patient evaluation  fibromyalgia, opiate dependence, chronic pain, arthritis, asthma, sleep apnea, GERD, depression   Additional history obtained:  Additional history obtained from EMR External records from outside source obtained and reviewed including N/A   Lab Tests:  I Ordered, and personally interpreted labs.  The pertinent results include: Leukocytosis is present.  Creatinine is slightly increased from baseline.  Lab work is otherwise unremarkable.   Imaging Studies ordered:  I ordered imaging studies including CT of abdomen pelvis, pelvic ultrasound I independently visualized and interpreted imaging which showed CT scan shows acute appendicitis. I agree with the radiologist interpretation   Cardiac Monitoring: / EKG:  The patient was maintained on a cardiac monitor.  I personally viewed and interpreted the cardiac monitored which showed an underlying rhythm of: Sinus rhythm   Problem List / ED Course / Critical interventions / Medication management  Patient presented for 1 week of  ongoing right lower quadrant abdominal pain in addition to onset of fevers last night.  On arrival in the ED, patient is mildly tachycardic with a low-grade temperature of 100.1 degrees.  She is overall well-appearing on exam.  She describes area  pain as right lower quadrant.  She does have localized tenderness without guarding.  Current pain is 7/10 in severity.  Percocet was ordered for analgesia.  Workup was initiated.  Patient's lab work notable for leukocytosis of 21.8.  CT scan shows acute appendicitis.  Antibiotics were ordered.  I spoke with general surgeon on-call, [name], who request hospitalist admission.  Patient will go for surgery today.  Patient was admitted for further management. I ordered medication including IV fluids for hydration, Percocet, Dilaudid  and Dilaudid  for analgesia, Zosyn  for appendicitis Reevaluation of the patient after these medicines showed that the patient improved I have reviewed the patients home medicines and have made adjustments as needed   Consultations Obtained:  I requested consultation with the general surgeon, [name],  and discussed lab and imaging findings as well as pertinent plan - they recommend: Admission to hospitalist, surgery today   Social Determinants of Health:  Lives independently     Final diagnoses:  Acute appendicitis, unspecified acute appendicitis type    ED Discharge Orders     None          Melvenia Motto, MD 05/12/24 1652

## 2024-05-12 NOTE — Consult Note (Signed)
 [facility] Consult  Reason for Consult: Acute appendicitis  Referring Physician: Dr. Melvenia  Chief Complaint   Abdominal Pain     HPI: Karen Lewis is a 53 y.o. female who presents with a 1 week history of right lower quadrant abdominal pain.  She denies nausea and vomiting.  She initially thought the pain was a pulled muscle, but when it failed to improve, she decided to present to the emergency department.  She was recently seen outpatient and was treated for a UTI.  She is on day 5 of augmentin  for treatment of her UTI.  She confirms fever overnight.  She has a past medical history significant for fibromyalgia, opioid dependence, chronic pain, asthma, sleep apnea, GERD, depression, hypertension, and diabetes.  Her surgical history is significant for cholecystectomy, laparoscopic gastric sleeve, Roux-en-Y gastric bypass, and panniculectomy with subsequent wound infection.  All of her abdominal surgeries have been laparoscopic.  In the ED, she was noted to be mildly tachycardic.  Her blood work demonstrated a leukocytosis of 21.8.  She underwent a CT of the abdomen and pelvis which is demonstrating findings concerning for acute appendicitis without evidence of perforation or abscess.  Past Medical History:  Diagnosis Date   Anemia    Anginal pain (HCC)    Anxiety    Arthralgia of hip 07/29/2015   Arthritis    Arthritis, degenerative 07/29/2015   Asthma    Cephalalgia 07/25/2014   Dependence on unknown drug (HCC)    multiplt controlled drug dependence   Depression    Diabetes mellitus without complication (HCC)    Difficult intubation    per pt needs small tube (has had abrasions from tube in past)   Dysrhythmia    PVC's   Eczema    Fibromyalgia    Gastritis    GERD (gastroesophageal reflux disease)    Gonalgia 07/29/2015   Overview:  Overview:  The patient has had bilateral intra-articular Hyalgan injections done on 07/16/2014 and although she seems to do well  with this type of therapy, apparently her insurance company does not want to pay for they Hyalgan. On 11/27/2014 the patient underwent a bilateral genicular nerve block with excellent results. On 01/28/2015 she had a right knee genicular radiofrequency ablatio   Gout    H/O cardiovascular disorder 03/10/2015   H/O surgical procedure 12/05/2012   Overview:  LSG ([facility] - April 2013)     H/O thyroid  disease 03/10/2015   Headache    Herpes    History of artificial joint 07/29/2015   History of hiatal hernia    Hypertension    Hypomagnesemia    Hypothyroidism    IDA (iron  deficiency anemia) 05/28/2019   LBP (low back pain) 07/29/2015   Neuromuscular disorder (HCC)    Obesity    PCOS (polycystic ovarian syndrome)    Primary osteoarthritis of both knees 07/29/2015   Sleep apnea    not using a Cpap machine at this time - most recent test mild apnea does not qualify for test (not OSA)   Thyroid  nodule bilateral   Umbilical hernia     Past Surgical History:  Procedure Laterality Date   APPENDECTOMY     CARPAL TUNNEL RELEASE Bilateral    CHOLECYSTECTOMY     COLONOSCOPY WITH PROPOFOL  N/A 02/11/2021   Procedure: COLONOSCOPY WITH PROPOFOL ;  Surgeon: Toledo, Ladell POUR, MD;  Location: [facility];  Service: Gastroenterology;  Laterality: N/A;   DIAGNOSTIC LAPAROSCOPY     ESOPHAGOGASTRODUODENOSCOPY (EGD) WITH PROPOFOL  N/A  02/11/2021   Procedure: ESOPHAGOGASTRODUODENOSCOPY (EGD) WITH PROPOFOL ;  Surgeon: Toledo, Ladell POUR, MD;  Location: [facility] END[facility];  Service: Gastroenterology;  Laterality: N/A;   HIATAL HERNIA REPAIR     JOINT REPLACEMENT Bilateral hip   LAPAROSCOPIC PARTIAL GASTRECTOMY     left trigger finger     peniculectomy N/A 07/05/2018   ROUX-EN-Y GASTRIC BYPASS  06/03/2017   SHOULDER ARTHROSCOPY Right    THYROIDECTOMY N/A 11/12/2015   Procedure: THYROIDECTOMY;  Surgeon: Deward Dolly, MD;  Location: [facility] ORS;  Service: ENT;  Laterality: N/A;   TOTAL HIP ARTHROPLASTY Right 11/27/2019    Procedure: TOTAL HIP ARTHROPLASTY ANTERIOR APPROACH;  Surgeon: Kathlynn Sharper, MD;  Location: [facility] ORS;  Service: Orthopedics;  Laterality: Right;   TRIGGER FINGER RELEASE Right    TRIGGER FINGER RELEASE Right 04/24/2020   Procedure: Right ring and middle trigger release;  Surgeon: Kathlynn Sharper, MD;  Location: [facility] ORS;  Service: Orthopedics;  Laterality: Right;    Family History  Problem Relation Age of Onset   Anxiety disorder Mother    Depression Mother    Alcohol  abuse Mother    Diabetes Mother    Hypertension Mother    Kidney cancer Mother    Sleep apnea Mother    Alcohol  abuse Father    Anxiety disorder Father    Depression Father    Post-traumatic stress disorder Father    Kidney failure Father    COPD Father    Diabetes Father    Hypertension Father    Sleep apnea Father    Depression Brother    Diabetes Brother    Hypertension Brother    Sleep apnea Brother    Breast cancer Paternal Aunt 85   Breast cancer Paternal Aunt 53   Brain cancer Paternal Uncle 40   Bladder Cancer Neg Hx    Prostate cancer Neg Hx     Social History   Tobacco Use   Smoking status: Never   Smokeless tobacco: Never  Vaping Use   Vaping status: Never Used  Substance Use Topics   Alcohol  use: No    Alcohol /week: 0.0 standard drinks of alcohol    Drug use: No    Medications: I have reviewed the patient's current medications.  Allergies  Allergen Reactions   Bactrim [Sulfamethoxazole-Trimethoprim] Hives   Omalizumab Itching and Hives   Ciprofloxacin Other (See Comments)    myalgia    Shellfish Allergy Other (See Comments)    + positive allergy test   Nsaids Nausea Only and Other (See Comments)    Stomach upset     ROS:  Pertinent items are noted in HPI.  Blood pressure 99/73, pulse (!) 103, temperature 100.1 F (37.8 C), temperature source Oral, resp. rate 18, height 5' 3 (1.6 m), weight (!) 152.9 kg, SpO2 94%. Physical Exam Vitals reviewed.  Constitutional:       Appearance: She is well-developed.  HENT:     Head: Normocephalic and atraumatic.  Cardiovascular:     Rate and Rhythm: Tachycardia present.  Pulmonary:     Effort: Pulmonary effort is normal.  Abdominal:     Comments: Abdomen soft, nondistended, no percussion tenderness, tenderness to palpation in the right lower quadrant; no rigidity, guarding, rebound tenderness; positive McBurney sign  Skin:    General: Skin is warm and dry.  Neurological:     General: No focal deficit present.     Mental Status: She is alert and oriented to person, place, and time.  Psychiatric:  Mood and Affect: Mood normal.        Behavior: Behavior normal.     Results: Results for orders placed or performed during the hospital encounter of 05/12/24 (from the past 48 hours)  Lipase, blood     Status: None   Collection Time: 05/12/24 11:25 AM  Result Value Ref Range   Lipase 40 11 - 51 U/L    Comment: Performed at [facility], [address] [place], [place] [address]  Comprehensive metabolic panel     Status: Abnormal   Collection Time: 05/12/24 11:25 AM  Result Value Ref Range   Sodium 136 135 - 145 mmol/L   Potassium 4.0 3.5 - 5.1 mmol/L   Chloride 102 98 - 111 mmol/L   CO2 22 22 - 32 mmol/L   Glucose, Bld 150 (H) 70 - 99 mg/dL    Comment: Glucose reference range applies only to samples taken after fasting for at least 8 hours.   BUN 19 6 - 20 mg/dL   Creatinine, Ser 8.71 (H) 0.44 - 1.00 mg/dL   Calcium  8.4 (L) 8.9 - 10.3 mg/dL   Total Protein 7.4 6.5 - 8.1 g/dL   Albumin 3.7 3.5 - 5.0 g/dL   AST 26 15 - 41 U/L   ALT 39 0 - 44 U/L   Alkaline Phosphatase 79 38 - 126 U/L   Total Bilirubin 0.7 0.0 - 1.2 mg/dL   GFR, Estimated 50 (L) >60 mL/min    Comment: (NOTE) Calculated using the CKD-EPI Creatinine Equation (2021)    Anion gap 12 5 - 15    Comment: Performed at [facility], [address] [place], [place] [address]  CBC     Status: Abnormal   Collection Time: 05/12/24 11:25 AM   Result Value Ref Range   WBC 21.8 (H) 4.0 - 10.5 K/uL   RBC 4.61 3.87 - 5.11 MIL/uL   Hemoglobin 13.6 12.0 - 15.0 g/dL   HCT 58.6 63.9 - 53.9 %   MCV 89.6 80.0 - 100.0 fL   MCH 29.5 26.0 - 34.0 pg   MCHC 32.9 30.0 - 36.0 g/dL   RDW 86.6 88.4 - 84.4 %   Platelets 300 150 - 400 K/uL   nRBC 0.0 0.0 - 0.2 %    Comment: Performed at [facility], [address] [place], [place] [address]  Urinalysis, Routine w reflex microscopic -Urine, Clean Catch     Status: None   Collection Time: 05/12/24 11:51 AM  Result Value Ref Range   Color, Urine YELLOW YELLOW   APPearance CLEAR CLEAR   Specific Gravity, Urine 1.012 1.005 - 1.030   pH 7.0 5.0 - 8.0   Glucose, UA NEGATIVE NEGATIVE mg/dL   Hgb urine dipstick NEGATIVE NEGATIVE   Bilirubin Urine NEGATIVE NEGATIVE   Ketones, ur NEGATIVE NEGATIVE mg/dL   Protein, ur NEGATIVE NEGATIVE mg/dL   Nitrite NEGATIVE NEGATIVE   Leukocytes,Ua NEGATIVE NEGATIVE    Comment: Performed at [facility], [address] [place], [place] [address]    CT ABDOMEN PELVIS W CONTRAST Result Date: 05/12/2024 EXAM: CT ABDOMEN AND PELVIS WITH CONTRAST 05/12/2024 01:20:37 PM TECHNIQUE: CT of the abdomen and pelvis was performed with the administration of 100 mL of intravenous iohexol  (OMNIPAQUE ) 300 MG/ML solution. Multiplanar reformatted images are provided for review. Automated exposure control, iterative reconstruction, and/or weight-based adjustment of the mA/kV was utilized to reduce the radiation dose to as low as reasonably achievable. COMPARISON: 04/28/2021 CLINICAL HISTORY: Abdominal pain, acute, nonlocalized; right lower  abdominal pain that radiates to right flank. Patient states she is currently being treated for a UTI and started a fever last night of 101.4. Patient states folding up a scooter and was worried about pulling a muscle. FINDINGS: LOWER CHEST: Moderate coronary calcification. Mild   aortic plaque. LIVER: Normal size and contour. GALLBLADDER AND BILE  DUCTS: Gallbladder not identified. SPLEEN: Normal size. No focal lesion. PANCREAS: No mass. No ductal dilatation. ADRENAL GLANDS: Normal appearance. No mass. KIDNEYS, URETERS AND BLADDER: No stones in the kidneys or ureters. No hydronephrosis. No perinephric or periureteral stranding. Urinary bladder is unremarkable. GI AND BOWEL: Post gastric bypass surgery. Dilated fluid-filled appendix containing 2 calcified appendicoliths, with some mucosal enhancement. No definite perforation or abscess. Significant regional inflammatory changes however. Stomach demonstrates no acute abnormality. There is no bowel obstruction. No bowel wall thickening. PERITONEUM AND RETROPERITONEUM: No ascites. No free air. VASCULATURE: Infrarenal IVC filter with caval wall perforation by several legs, no definite adjacent organ involvement. Aorta is normal in caliber. LYMPH NODES: No lymphadenopathy. REPRODUCTIVE ORGANS: No significant abnormality. BONES AND SOFT TISSUES: bilateral hip arthroplasty. Moderate umbilical hernia containing mesenteric fat. Vertebral endplate spurring at multiple levels in the lower thoracic spine. Mild spondylitic changes in the lumbar spine. No acute osseous abnormality. No focal soft tissue abnormality. IMPRESSION: 1. Acute appendicitis. No definite perforation or abscess. Findings telephoned to Dr. Melvenia at time of interpretation. 2. Moderate umbilical hernia containing mesenteric fat. 3. Infrarenal IVC filter with caval wall perforation by several legs, without definite adjacent organ involvement. Electronically signed by: Dayne Hassell MD 05/12/2024 01:50 PM EDT RP Workstation: [id number]     Assessment & Plan:  Karen Lewis is a 53 y.o. female who presents for a 1 week history of right lower quadrant abdominal pain.  Imaging is consistent with acute appendicitis.  Imaging and blood work evaluated by myself.  -We discussed the pathophysiology of appendicitis -Discussed the risk of laparoscopic  appendectomy and the option of antibiotics alone. Discussed that in [place] and some trials in the [place] , antibiotics are used for simple appendicitis. Discussed that research shows a 40% failure rate for antibiotics alone.  Discussed risk of surgery including but not limited to bleeding, infection, injury to other organs, normal appendix, and after this discussion the patient has decided to proceed with surgery. -Plan for surgery today -Continue IV Zosyn  -NPO -IV fluids -Given patient's multiple medical comorbidities, will admit to hospitalist service -Further recommendations to follow surgery  All questions were answered to the satisfaction of the patient.  Note: Portions of this report may have been transcribed using voice recognition software. Every effort has been made to ensure accuracy; however, inadvertent computerized transcription errors may still be present.   -- Dorothyann Brittle, DO [facility] [address] [place] [address], [place] [address] [phone] (office)

## 2024-05-12 NOTE — Progress Notes (Signed)
 [facility]  Spoke with the patient's husband on the phone.  I explained that she tolerated the procedure without difficulty.  She has been extubated and transported up to the stepdown unit for close monitoring of her respiratory status overnight.  I explained that her appendix was perforated.  We will give her a diet and see how she tolerates this.  Pending what her blood work looks like tomorrow, her tolerance of diet, and her respiratory status, she will hopefully be discharged out of the hospital in the next 24 to 48 hours.  All questions were answered to his expressed satisfaction.  Plan: -Patient admitted to stepdown under hospitalist -Continue IV Zosyn .  Will need antibiotics for a total of 5 days given perforated appendicitis noted intraoperatively -Full liquid diet ordered.  Advance diet as tolerated -Close monitoring of respiratory status postoperatively -PRN pain control and antiemetics -IV fluids per primary team -Appreciate hospitalist recommendations  Dorothyann Brittle, DO [facility] [address] [place], [place] [address] [phone] (office)

## 2024-05-12 NOTE — Anesthesia Preprocedure Evaluation (Signed)
 Anesthesia Evaluation  Patient identified by MRN, date of birth, ID band Patient awake    Reviewed: Allergy & Precautions, H&P , NPO status , Patient's Chart, lab work & pertinent test results, reviewed documented beta blocker date and time   History of Anesthesia Complications (+) DIFFICULT AIRWAY and history of anesthetic complications  Airway Mallampati: II  TM Distance: >3 FB Neck ROM: full    Dental no notable dental hx.    Pulmonary neg pulmonary ROS, asthma , sleep apnea    Pulmonary exam normal breath sounds clear to auscultation       Cardiovascular Exercise Tolerance: Good hypertension, + angina  negative cardio ROS + dysrhythmias  Rhythm:regular Rate:Normal     Neuro/Psych  Headaches PSYCHIATRIC DISORDERS Anxiety Depression     Neuromuscular disease negative neurological ROS  negative psych ROS   GI/Hepatic negative GI ROS, Neg liver ROS, hiatal hernia,GERD  ,,  Endo/Other  diabetesHypothyroidism  Class 4 obesity  Renal/GU Renal diseasenegative Renal ROS  negative genitourinary   Musculoskeletal   Abdominal   Peds  Hematology negative hematology ROS (+) Blood dyscrasia, anemia   Anesthesia Other Findings   Reproductive/Obstetrics negative OB ROS                              Anesthesia Physical Anesthesia Plan  ASA: 4 and emergent  Anesthesia Plan: General and General ETT   Post-op Pain Management:    Induction:   PONV Risk Score and Plan: Ondansetron   Airway Management Planned:   Additional Equipment:   Intra-op Plan:   Post-operative Plan:   Informed Consent: I have reviewed the patients History and Physical, chart, labs and discussed the procedure including the risks, benefits and alternatives for the proposed anesthesia with the patient or authorized representative who has indicated his/her understanding and acceptance.     Dental Advisory Given  Plan  Discussed with: CRNA  Anesthesia Plan Comments:         Anesthesia Quick Evaluation

## 2024-05-12 NOTE — Progress Notes (Signed)
 Patient stated to me that she uses Breo inhaler at home and Albuterol  at home PRN for her Asthma.  Patient also states that she uses Singulair  as well as an Zyzol.  Patient states that she would like breathing treatments to manage any breathing issues.

## 2024-05-12 NOTE — ED Triage Notes (Signed)
 Pt c/o rt lower abdominal pain that radiates to rt flank. Pt states she is currently being treated for a UTI. Pt states starting a fever last night of 101.4. Pt states folding up a scooter and was worried about pulling a muscle.

## 2024-05-12 NOTE — Progress Notes (Signed)
 Gave patient incentive spirometer to use.  Patient was able to achieve  X10.  IS left at bedside for patient.  Patient did however refuse CPAP.  Patient states she does not use at home.  I told patient if she changes her mind, to call and we can get her one to use.

## 2024-05-13 ENCOUNTER — Encounter (HOSPITAL_COMMUNITY): Payer: Self-pay | Admitting: Surgery

## 2024-05-13 DIAGNOSIS — F1120 Opioid dependence, uncomplicated: Secondary | ICD-10-CM | POA: Diagnosis present

## 2024-05-13 DIAGNOSIS — R509 Fever, unspecified: Secondary | ICD-10-CM | POA: Diagnosis not present

## 2024-05-13 DIAGNOSIS — I129 Hypertensive chronic kidney disease with stage 1 through stage 4 chronic kidney disease, or unspecified chronic kidney disease: Secondary | ICD-10-CM | POA: Diagnosis present

## 2024-05-13 DIAGNOSIS — J45909 Unspecified asthma, uncomplicated: Secondary | ICD-10-CM | POA: Diagnosis not present

## 2024-05-13 DIAGNOSIS — E66813 Obesity, class 3: Secondary | ICD-10-CM | POA: Diagnosis present

## 2024-05-13 DIAGNOSIS — Z6841 Body Mass Index (BMI) 40.0 and over, adult: Secondary | ICD-10-CM | POA: Diagnosis not present

## 2024-05-13 DIAGNOSIS — K9189 Other postprocedural complications and disorders of digestive system: Secondary | ICD-10-CM | POA: Diagnosis not present

## 2024-05-13 DIAGNOSIS — K56609 Unspecified intestinal obstruction, unspecified as to partial versus complete obstruction: Secondary | ICD-10-CM | POA: Diagnosis not present

## 2024-05-13 DIAGNOSIS — K3589 Other acute appendicitis without perforation or gangrene: Secondary | ICD-10-CM | POA: Diagnosis not present

## 2024-05-13 DIAGNOSIS — E1165 Type 2 diabetes mellitus with hyperglycemia: Secondary | ICD-10-CM | POA: Diagnosis not present

## 2024-05-13 DIAGNOSIS — E119 Type 2 diabetes mellitus without complications: Secondary | ICD-10-CM | POA: Diagnosis not present

## 2024-05-13 DIAGNOSIS — K3532 Acute appendicitis with perforation and localized peritonitis, without abscess: Secondary | ICD-10-CM

## 2024-05-13 DIAGNOSIS — F32A Depression, unspecified: Secondary | ICD-10-CM | POA: Diagnosis present

## 2024-05-13 DIAGNOSIS — K567 Ileus, unspecified: Secondary | ICD-10-CM | POA: Diagnosis not present

## 2024-05-13 DIAGNOSIS — D72829 Elevated white blood cell count, unspecified: Secondary | ICD-10-CM | POA: Diagnosis not present

## 2024-05-13 DIAGNOSIS — J4540 Moderate persistent asthma, uncomplicated: Secondary | ICD-10-CM | POA: Diagnosis present

## 2024-05-13 DIAGNOSIS — I10 Essential (primary) hypertension: Secondary | ICD-10-CM | POA: Diagnosis not present

## 2024-05-13 DIAGNOSIS — K219 Gastro-esophageal reflux disease without esophagitis: Secondary | ICD-10-CM | POA: Diagnosis present

## 2024-05-13 DIAGNOSIS — F1920 Other psychoactive substance dependence, uncomplicated: Secondary | ICD-10-CM | POA: Diagnosis not present

## 2024-05-13 DIAGNOSIS — E1149 Type 2 diabetes mellitus with other diabetic neurological complication: Secondary | ICD-10-CM | POA: Diagnosis present

## 2024-05-13 DIAGNOSIS — K436 Other and unspecified ventral hernia with obstruction, without gangrene: Secondary | ICD-10-CM | POA: Diagnosis not present

## 2024-05-13 DIAGNOSIS — N1831 Chronic kidney disease, stage 3a: Secondary | ICD-10-CM | POA: Diagnosis present

## 2024-05-13 DIAGNOSIS — R627 Adult failure to thrive: Secondary | ICD-10-CM | POA: Diagnosis present

## 2024-05-13 DIAGNOSIS — F411 Generalized anxiety disorder: Secondary | ICD-10-CM | POA: Diagnosis present

## 2024-05-13 DIAGNOSIS — Z66 Do not resuscitate: Secondary | ICD-10-CM | POA: Diagnosis not present

## 2024-05-13 DIAGNOSIS — G894 Chronic pain syndrome: Secondary | ICD-10-CM | POA: Diagnosis present

## 2024-05-13 DIAGNOSIS — E1122 Type 2 diabetes mellitus with diabetic chronic kidney disease: Secondary | ICD-10-CM | POA: Diagnosis present

## 2024-05-13 DIAGNOSIS — E890 Postprocedural hypothyroidism: Secondary | ICD-10-CM | POA: Diagnosis present

## 2024-05-13 DIAGNOSIS — R1031 Right lower quadrant pain: Secondary | ICD-10-CM | POA: Diagnosis present

## 2024-05-13 DIAGNOSIS — E782 Mixed hyperlipidemia: Secondary | ICD-10-CM | POA: Diagnosis present

## 2024-05-13 DIAGNOSIS — N390 Urinary tract infection, site not specified: Secondary | ICD-10-CM | POA: Diagnosis present

## 2024-05-13 LAB — HEMOGLOBIN A1C
Hgb A1c MFr Bld: 6.6 % — ABNORMAL HIGH (ref 4.8–5.6)
Mean Plasma Glucose: 143 mg/dL

## 2024-05-13 LAB — CBC WITH DIFFERENTIAL/PLATELET
Abs Immature Granulocytes: 0.05 K/uL (ref 0.00–0.07)
Basophils Absolute: 0 K/uL (ref 0.0–0.1)
Basophils Relative: 0 %
Eosinophils Absolute: 0.2 K/uL (ref 0.0–0.5)
Eosinophils Relative: 1 %
HCT: 35.1 % — ABNORMAL LOW (ref 36.0–46.0)
Hemoglobin: 11.3 g/dL — ABNORMAL LOW (ref 12.0–15.0)
Immature Granulocytes: 0 %
Lymphocytes Relative: 13 %
Lymphs Abs: 2.1 K/uL (ref 0.7–4.0)
MCH: 29.4 pg (ref 26.0–34.0)
MCHC: 32.2 g/dL (ref 30.0–36.0)
MCV: 91.2 fL (ref 80.0–100.0)
Monocytes Absolute: 1.2 K/uL — ABNORMAL HIGH (ref 0.1–1.0)
Monocytes Relative: 7 %
Neutro Abs: 12.8 K/uL — ABNORMAL HIGH (ref 1.7–7.7)
Neutrophils Relative %: 79 %
Platelets: 225 K/uL (ref 150–400)
RBC: 3.85 MIL/uL — ABNORMAL LOW (ref 3.87–5.11)
RDW: 13.3 % (ref 11.5–15.5)
WBC: 16.3 K/uL — ABNORMAL HIGH (ref 4.0–10.5)
nRBC: 0 % (ref 0.0–0.2)

## 2024-05-13 LAB — GLUCOSE, CAPILLARY
Glucose-Capillary: 149 mg/dL — ABNORMAL HIGH (ref 70–99)
Glucose-Capillary: 138 mg/dL — ABNORMAL HIGH (ref 70–99)
Glucose-Capillary: 142 mg/dL — ABNORMAL HIGH (ref 70–99)
Glucose-Capillary: 110 mg/dL — ABNORMAL HIGH (ref 70–99)
Glucose-Capillary: 163 mg/dL — ABNORMAL HIGH (ref 70–99)
Glucose-Capillary: 149 mg/dL — ABNORMAL HIGH (ref 70–99)

## 2024-05-13 LAB — COMPREHENSIVE METABOLIC PANEL WITH GFR
ALT: 26 U/L (ref 0–44)
AST: 17 U/L (ref 15–41)
Albumin: 2.9 g/dL — ABNORMAL LOW (ref 3.5–5.0)
Alkaline Phosphatase: 64 U/L (ref 38–126)
Anion gap: 9 (ref 5–15)
BUN: 19 mg/dL (ref 6–20)
CO2: 24 mmol/L (ref 22–32)
Calcium: 7.7 mg/dL — ABNORMAL LOW (ref 8.9–10.3)
Chloride: 104 mmol/L (ref 98–111)
Creatinine, Ser: 1.19 mg/dL — ABNORMAL HIGH (ref 0.44–1.00)
GFR, Estimated: 55 mL/min — ABNORMAL LOW (ref >60)
Glucose, Bld: 146 mg/dL — ABNORMAL HIGH (ref 70–99)
Potassium: 3.9 mmol/L (ref 3.5–5.1)
Sodium: 137 mmol/L (ref 135–145)
Total Bilirubin: 0.6 mg/dL (ref 0.0–1.2)
Total Protein: 6.1 g/dL — ABNORMAL LOW (ref 6.5–8.1)

## 2024-05-13 LAB — MAGNESIUM: Magnesium: 1.8 mg/dL (ref 1.7–2.4)

## 2024-05-13 LAB — HIV ANTIBODY (ROUTINE TESTING W REFLEX): HIV Screen 4th Generation wRfx: NONREACTIVE

## 2024-05-13 MED ORDER — KETOROLAC TROMETHAMINE 30 MG/ML IJ SOLN
30.0000 mg | Freq: Two times a day (BID) | INTRAMUSCULAR | Status: DC
  Administered 2024-05-13 (×2): 30 mg via INTRAVENOUS
  Filled 2024-05-13 (×2): qty 1

## 2024-05-13 MED ORDER — METHOCARBAMOL 500 MG PO TABS
500.0000 mg | ORAL_TABLET | Freq: Three times a day (TID) | ORAL | Status: DC
  Administered 2024-05-13 – 2024-05-16 (×9): 500 mg via ORAL
  Filled 2024-05-13 (×9): qty 1

## 2024-05-13 MED ORDER — PROGESTERONE MICRONIZED 100 MG PO CAPS
100.0000 mg | ORAL_CAPSULE | Freq: Every day | ORAL | Status: DC
  Administered 2024-05-13 – 2024-05-20 (×8): 100 mg via ORAL
  Filled 2024-05-13 (×9): qty 1

## 2024-05-13 MED ORDER — ROSUVASTATIN CALCIUM 20 MG PO TABS
40.0000 mg | ORAL_TABLET | Freq: Every day | ORAL | Status: DC
  Administered 2024-05-13 – 2024-05-16 (×4): 40 mg via ORAL
  Filled 2024-05-13 (×4): qty 2

## 2024-05-13 MED ORDER — CLONAZEPAM 0.5 MG PO TABS
0.5000 mg | ORAL_TABLET | Freq: Two times a day (BID) | ORAL | Status: DC | PRN
  Administered 2024-05-14 – 2024-05-15 (×2): 0.5 mg via ORAL
  Filled 2024-05-13 (×2): qty 1

## 2024-05-13 MED ORDER — LORATADINE 10 MG PO TABS
10.0000 mg | ORAL_TABLET | Freq: Every day | ORAL | Status: DC
  Administered 2024-05-13 – 2024-05-15 (×3): 10 mg via ORAL
  Filled 2024-05-13 (×3): qty 1

## 2024-05-13 MED ORDER — CHLORHEXIDINE GLUCONATE CLOTH 2 % EX PADS
6.0000 | MEDICATED_PAD | Freq: Every day | CUTANEOUS | Status: DC
  Administered 2024-05-13 – 2024-05-21 (×6): 6 via TOPICAL

## 2024-05-13 MED ORDER — TOPIRAMATE 100 MG PO TABS
100.0000 mg | ORAL_TABLET | Freq: Two times a day (BID) | ORAL | Status: DC
  Administered 2024-05-13 – 2024-05-16 (×8): 100 mg via ORAL
  Filled 2024-05-13 (×8): qty 1

## 2024-05-13 MED ORDER — DULOXETINE HCL 60 MG PO CPEP
60.0000 mg | ORAL_CAPSULE | Freq: Every day | ORAL | Status: DC
  Administered 2024-05-13 – 2024-05-21 (×9): 60 mg via ORAL
  Filled 2024-05-13 (×9): qty 1

## 2024-05-13 MED ORDER — HYDROMORPHONE HCL 1 MG/ML IJ SOLN
1.0000 mg | Freq: Once | INTRAMUSCULAR | Status: AC
  Administered 2024-05-13: 1 mg via INTRAVENOUS
  Filled 2024-05-13: qty 1

## 2024-05-13 MED ORDER — LEVOTHYROXINE SODIUM 125 MCG PO TABS
125.0000 ug | ORAL_TABLET | Freq: Every morning | ORAL | Status: DC
  Administered 2024-05-14 – 2024-05-16 (×3): 125 ug via ORAL
  Filled 2024-05-13 (×3): qty 1

## 2024-05-13 MED ORDER — MAGNESIUM SULFATE 2 GM/50ML IV SOLN
2.0000 g | Freq: Once | INTRAVENOUS | Status: AC
  Administered 2024-05-13: 2 g via INTRAVENOUS

## 2024-05-13 MED ORDER — LACTATED RINGERS IV SOLN: INTRAVENOUS | Status: AC

## 2024-05-13 MED ORDER — METOPROLOL TARTRATE 25 MG PO TABS
12.5000 mg | ORAL_TABLET | Freq: Three times a day (TID) | ORAL | Status: AC
  Administered 2024-05-13 – 2024-05-15 (×7): 12.5 mg via ORAL
  Filled 2024-05-13 (×8): qty 1

## 2024-05-13 MED ORDER — HYDROMORPHONE HCL 1 MG/ML IJ SOLN
1.0000 mg | INTRAMUSCULAR | Status: DC | PRN
  Administered 2024-05-13 – 2024-05-14 (×3): 1 mg via INTRAVENOUS
  Filled 2024-05-13 (×3): qty 1

## 2024-05-13 MED ORDER — TIZANIDINE HCL 2 MG PO TABS
4.0000 mg | ORAL_TABLET | Freq: Two times a day (BID) | ORAL | Status: DC | PRN
  Administered 2024-05-14: 4 mg via ORAL
  Filled 2024-05-13: qty 1

## 2024-05-13 MED ORDER — LEVOCETIRIZINE DIHYDROCHLORIDE 5 MG PO TABS: 5.0000 mg | ORAL_TABLET | Freq: Every day | ORAL | Status: DC

## 2024-05-13 MED ORDER — RISAQUAD PO CAPS
1.0000 | ORAL_CAPSULE | Freq: Every day | ORAL | Status: DC
  Administered 2024-05-13 – 2024-05-16 (×4): 1 via ORAL
  Filled 2024-05-13 (×4): qty 1

## 2024-05-13 MED ORDER — KETOROLAC TROMETHAMINE 15 MG/ML IJ SOLN
15.0000 mg | Freq: Once | INTRAMUSCULAR | Status: AC
  Administered 2024-05-13: 15 mg via INTRAVENOUS
  Filled 2024-05-13: qty 1

## 2024-05-13 MED ORDER — QUETIAPINE FUMARATE 25 MG PO TABS
50.0000 mg | ORAL_TABLET | Freq: Every day | ORAL | Status: DC
  Administered 2024-05-13 – 2024-05-16 (×4): 50 mg via ORAL
  Filled 2024-05-13 (×4): qty 2

## 2024-05-13 MED ORDER — FLUTICASONE FUROATE-VILANTEROL 200-25 MCG/ACT IN AEPB
1.0000 | INHALATION_SPRAY | Freq: Every day | RESPIRATORY_TRACT | Status: DC
  Administered 2024-05-13 – 2024-05-21 (×9): 1 via RESPIRATORY_TRACT
  Filled 2024-05-13: qty 28

## 2024-05-13 MED ORDER — NYSTATIN 100000 UNIT/GM EX POWD
Freq: Two times a day (BID) | CUTANEOUS | Status: DC
  Filled 2024-05-13 (×4): qty 15

## 2024-05-13 MED ORDER — PREGABALIN 75 MG PO CAPS
225.0000 mg | ORAL_CAPSULE | Freq: Two times a day (BID) | ORAL | Status: DC
  Administered 2024-05-13 – 2024-05-21 (×17): 225 mg via ORAL
  Filled 2024-05-13 (×17): qty 3

## 2024-05-13 MED ORDER — LACTATED RINGERS IV SOLN: INTRAVENOUS | Status: DC

## 2024-05-13 MED ORDER — AMITRIPTYLINE HCL 25 MG PO TABS
100.0000 mg | ORAL_TABLET | Freq: Every day | ORAL | Status: DC
  Administered 2024-05-13 – 2024-05-16 (×4): 100 mg via ORAL
  Filled 2024-05-13 (×4): qty 4

## 2024-05-13 MED ORDER — MONTELUKAST SODIUM 10 MG PO TABS
10.0000 mg | ORAL_TABLET | Freq: Every day | ORAL | Status: DC
  Administered 2024-05-13 – 2024-05-15 (×3): 10 mg via ORAL
  Filled 2024-05-13 (×4): qty 1

## 2024-05-13 MED ORDER — PANTOPRAZOLE SODIUM 40 MG PO TBEC
40.0000 mg | DELAYED_RELEASE_TABLET | Freq: Every evening | ORAL | Status: DC
  Administered 2024-05-13 – 2024-05-15 (×3): 40 mg via ORAL
  Filled 2024-05-13 (×3): qty 1

## 2024-05-13 MED ORDER — LISINOPRIL 5 MG PO TABS
5.0000 mg | ORAL_TABLET | Freq: Every day | ORAL | Status: DC
  Administered 2024-05-13 – 2024-05-16 (×4): 5 mg via ORAL
  Filled 2024-05-13 (×4): qty 1

## 2024-05-13 NOTE — Plan of Care (Signed)
   Problem: Clinical Measurements: Goal: Diagnostic test results will improve Outcome: Progressing Goal: Respiratory complications will improve Outcome: Progressing Goal: Cardiovascular complication will be avoided Outcome: Progressing   Problem: Activity: Goal: Risk for activity intolerance will decrease Outcome: Progressing   Problem: Nutrition: Goal: Adequate nutrition will be maintained Outcome: Progressing

## 2024-05-13 NOTE — Progress Notes (Signed)
 [facility] Progress Note  1 Day Post-Op  Subjective: Patient seen and examined.  She is resting comfortably in bed and is about to eat lunch.  She tolerated full liquids without nausea and vomiting.  Her biggest complaint is severe abdominal pain and shoulder pain.  She has been getting around-the-clock Dilaudid  and oxycodone  10 mg.  Objective: Vital signs in last 24 hours: Temp:  [97.6 F (36.4 C)-98.7 F (37.1 C)] 98.7 F (37.1 C) (08/31 1130) Pulse Rate:  [86-97] 88 (08/31 0500) Resp:  [14-24] 18 (08/31 0500) BP: (89-132)/(51-78) 113/51 (08/31 0500) SpO2:  [95 %-100 %] 97 % (08/31 0500) FiO2 (%):  [44 %] 44 % (08/30 1700) Weight:  [155.4 kg] 155.4 kg (08/30 1717)    Intake/Output from previous day: 08/30 0701 - 08/31 0700 In: 3340.3 [P.O.:840; I.V.:2450.3; IV Piggyback:50] Out: -  Intake/Output this shift: Total I/O In: 240 [P.O.:240] Out: 2100 [Urine:2100]  General appearance: alert, cooperative, and no distress GI: Abdomen soft, nondistended, no percussion tenderness, mild incisional tenderness to palpation in right lower quadrant tenderness to palpation; no rigidity, guarding, rebound tenderness; laparoscopic incision sites with dressings in place, no strikethrough  Lab Results:  Recent Labs    05/12/24 1125 05/13/24 0338  WBC 21.8* 16.3*  HGB 13.6 11.3*  HCT 41.3 35.1*  PLT 300 225   BMET Recent Labs    05/12/24 1125 05/13/24 0338  NA 136 137  K 4.0 3.9  CL 102 104  CO2 22 24  GLUCOSE 150* 146*  BUN 19 19  CREATININE 1.28* 1.19*  CALCIUM  8.4* 7.7*   PT/INR No results for input(s): LABPROT, INR in the last 72 hours.  Studies/Results: US  PELVIC COMPLETE W TRANSVAGINAL AND TORSION R/O Result Date: 05/12/2024 EXAM: US  Pelvis, Complete Transvaginal and Transabdominal without Doppler TECHNIQUE: Transabdominal and transvaginal pelvic duplex ultrasound using B-mode/gray scaled imaging without Doppler spectral analysis and color flow  was obtained. COMPARISON: None provided CLINICAL HISTORY: Pelvic pain. Additional clinical history of acute appendicitis on CT from earlier today. FINDINGS: UTERUS: Small anteverted uterus measures 3.9 x 1.7 x 2.5 cm, uterus volume 9.4 cc. No uterine fibroids. ENDOMETRIAL STRIPE: Bilayer endometrial thickness 1 mm. No focal endometrial mass. Trace fluid in the fundal endometrial cavity. RIGHT OVARY: Right ovary measures 2.6 x 1.2 x 1.2 cm and is normal with normal arterial and venous flow on color and spectral Doppler. No adnexal mass. LEFT OVARY: Left ovary not discretely visualized. No adnexal mass. FREE FLUID: No significant free fluid in the pelvic cul-de-sac. IMPRESSION: 1. No acute findings. 2. Normal right ovary. Nonvisualization of left ovary. No adnexal masses. 3. Small anteverted uterus with trace fluid in the fundal endometrial cavity. Normal endometrial thickness. Electronically signed by: Selinda Blue MD 05/12/2024 03:08 PM EDT RP Workstation: [id number]   CT ABDOMEN PELVIS W CONTRAST Result Date: 05/12/2024 EXAM: CT ABDOMEN AND PELVIS WITH CONTRAST 05/12/2024 01:20:37 PM TECHNIQUE: CT of the abdomen and pelvis was performed with the administration of 100 mL of intravenous iohexol  (OMNIPAQUE ) 300 MG/ML solution. Multiplanar reformatted images are provided for review. Automated exposure control, iterative reconstruction, and/or weight-based adjustment of the mA/kV was utilized to reduce the radiation dose to as low as reasonably achievable. COMPARISON: 04/28/2021 CLINICAL HISTORY: Abdominal pain, acute, nonlocalized; right lower abdominal pain that radiates to right flank. Patient states she is currently being treated for a UTI and started a fever last night of 101.4. Patient states folding up a scooter and was worried about pulling a muscle. FINDINGS: LOWER CHEST:  Moderate coronary calcification. Mild   aortic plaque. LIVER: Normal size and contour. GALLBLADDER AND BILE DUCTS: Gallbladder not  identified. SPLEEN: Normal size. No focal lesion. PANCREAS: No mass. No ductal dilatation. ADRENAL GLANDS: Normal appearance. No mass. KIDNEYS, URETERS AND BLADDER: No stones in the kidneys or ureters. No hydronephrosis. No perinephric or periureteral stranding. Urinary bladder is unremarkable. GI AND BOWEL: Post gastric bypass surgery. Dilated fluid-filled appendix containing 2 calcified appendicoliths, with some mucosal enhancement. No definite perforation or abscess. Significant regional inflammatory changes however. Stomach demonstrates no acute abnormality. There is no bowel obstruction. No bowel wall thickening. PERITONEUM AND RETROPERITONEUM: No ascites. No free air. VASCULATURE: Infrarenal IVC filter with caval wall perforation by several legs, no definite adjacent organ involvement. Aorta is normal in caliber. LYMPH NODES: No lymphadenopathy. REPRODUCTIVE ORGANS: No significant abnormality. BONES AND SOFT TISSUES: bilateral hip arthroplasty. Moderate umbilical hernia containing mesenteric fat. Vertebral endplate spurring at multiple levels in the lower thoracic spine. Mild spondylitic changes in the lumbar spine. No acute osseous abnormality. No focal soft tissue abnormality. IMPRESSION: 1. Acute appendicitis. No definite perforation or abscess. Findings telephoned to Dr. Melvenia at time of interpretation. 2. Moderate umbilical hernia containing mesenteric fat. 3. Infrarenal IVC filter with caval wall perforation by several legs, without definite adjacent organ involvement. Electronically signed by: Dayne Hassell MD 05/12/2024 01:50 PM EDT RP Workstation: [id number]    Anti-infectives: Anti-infectives (From admission, onward)    Start     Dose/Rate Route Frequency Ordered Stop   05/12/24 2200  piperacillin -tazobactam (ZOSYN ) IVPB 3.375 g        3.375 g 12.5 mL/hr over 240 Minutes Intravenous Every 8 hours 05/12/24 1447     05/12/24 1400  piperacillin -tazobactam (ZOSYN ) IVPB 3.375 g        3.375  g 100 mL/hr over 30 Minutes Intravenous  Once 05/12/24 1346 05/12/24 1453       Assessment/Plan:  Patient is a 53 year old female who was admitted with acute appendicitis.  She is status post robotic assisted laparoscopic appendectomy on 8/31 for acute perforated appendicitis.  -Leukocytosis is slightly downtrending today to 16.3 from 21.8.  Will continue to monitor with daily labs -Continue IV Zosyn .  Will need a total of 5 days of antibiotic treatment -Advance to regular diet -Monitor for bowel function -Patient currently on multiple medications for pain.  Currently on scheduled Tylenol , Lyrica , Zanaflex , and Topamax .  Will add on scheduled Robaxin  and Toradol  twice daily.  Continue as needed oxycodone  and Dilaudid .  I do not feel this patient is appropriate for PCA pump for pain control -Will check BMP tomorrow to evaluate creatinine.  Will also continue slow IV fluids to decrease risk of worsening AKI -Discussed with the patient that while we can try to improve her pain, her pain will not be able to fully go away. -Further discussed goal for discharge home tomorrow pending medical stability.  Advised that she may continue Dilaudid  doses today, but should try to decrease the frequency with which she is receiving those tomorrow to assist us  with potential discharge.   -Also discussed that she will need to follow-up with her pain clinic specialist this week after discharge regarding further narcotics, as I am limited with how much I can send her home with postoperatively. -Patient stable for transfer to MedSurg floor from surgical standpoint -Discussed case with Dr. Vicci.  Appreciate his recommendations   LOS: 0 days    Heli Dino A Rubyann Lingle 05/13/2024  Note: Portions of this report may have been  transcribed using voice recognition software. Every effort has been made to ensure accuracy; however, inadvertent computerized transcription errors may still be present.

## 2024-05-13 NOTE — Progress Notes (Signed)
 PROGRESS NOTE   Karen Lewis  FMW:[id number] DOB: 02-27-1971 DOA: 05/12/2024 PCP: [name], MD   Chief Complaint  Patient presents with   Abdominal Pain   Level of care: Med-Surg  Brief Admission History:  53 year old female with PCOS, morbid obesity status post Roux-en-Y gastric bypass and laparoscopic gastric sleeve, fibromyalgia, chronic pain, opioid dependence, sleep apnea not on CPAP, GAD, degenerative arthritis of knees and shoulders, asthma, type 2 diabetes mellitus, GERD, gonalgia, hypothyroidism, HSV, difficult intubation, cephalgia who reports that she was treated for UTI about 5 days ago with a course of Augmentin .  About 1 week ago she was having symptoms of right lower quadrant abdominal pain and thought it was related to the UTI versus muscle strain.  She started having fever overnight.  Her temperature was reportedly 101.4 at home.  Patient was evaluated in the emergency department today and CT scanning demonstrates acute appendicitis.  She has been evaluated by the surgical team and they are planning to take her to the OR later today.  She is being admitted to the stepdown ICU bed due to history of difficult intubation, multiple medical comorbidities and need for close postoperative monitoring.   Assessment and Plan:  Acute appendicitis with perforated appendix -- postop s/p robotic appendectomy 8/30 with 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 -- advanced diet after surgery  -- monitoring CBG with SSI coverage for now -- follow up A1c test -- 6.6%  CBG  (last 3)  Recent Labs    05/13/24 0324 05/13/24 0714 05/13/24 1122  GLUCAP 138* 142* 110*    Diabetic neuropathy -- resumed home pregabalin    Hypomagnesemia -- IV replacement ordered, recheck in AM   Sleep Apnea -- reportedly not on CPAP -- ordered CPAP autotitrated while inpatient   Chronic Pain Opioid dependence Fibromyalgia  -- IV pain management for now but plan to resume oral pain management starting 9/1    Essential hypertension  -- resume home meds when reconciled and able to take p.o.  -- IV hydralazine  ordered PRN for elevated BPs   Hyperlipidemia -- resume home rosuvastatin    Leukocytosis -- Improving  -- secondary to acute appendicitis -- WBC trending down after surgery  -- follow up blood culture -- continue IV antibiotics -- follow blood culture, lactic acid is reassuring at 1.0 -- repeat CBC with diff in AM after surgery    Class 3 obesity  -- s/p Roux-en-Y Gastric bypass and gastric sleeve procedure -- has been on semaglutide -- she is working closely with her PCP on weight loss journey -- supportive management for now while inpatient    DVT prophylaxis: enoxaparin   Code Status: Full  Family Communication:  Disposition: anticipate home    Consultants:  Surgery   Procedures:  Robotic appendectomy 05/12/24   Antimicrobials:    Subjective: Pt reports uncontrolled pain today from incisions, unable to sleep well overnight, she has been tolerating diet well, says the toradol  helped that was given overnight.    Objective: Vitals:   05/13/24 0400 05/13/24 0500 05/13/24 0745 05/13/24 1130  BP: 132/63 (!) 113/51  Pulse: 90 88    Resp: 17 18    Temp:   98 F (36.7 C) 98.7 F (37.1 C)  TempSrc:   Oral Oral  SpO2: 96% 97%    Weight:      Height:        Intake/Output Summary (Last 24 hours) at 05/13/2024 1233 Last data filed at 05/13/2024 9045 Gross per 24 hour  Intake 3580.29 ml  Output 1400 ml  Net 2180.29 ml   Filed Weights    05/12/24 1124 05/12/24 1717  Weight: (!) 152.9 kg (!) 155.4 kg   Examination:  General exam: Appears calm but uncomfortable, does not appear to be in distress.   Respiratory system: Clear to auscultation. Respiratory effort normal. Cardiovascular system: normal S1 & S2 heard. No JVD, murmurs, rubs, gallops or clicks. No pedal edema. Gastrointestinal system: Abdomen is obese, nondistended, soft and light tenderness, wounds clean and dry. No masses felt. Normal bowel sounds heard. Central nervous system: Alert and oriented. No focal neurological deficits. Extremities: Symmetric 5 x 5 power. Skin: No rashes, lesions or ulcers. Psychiatry: Judgement and insight appear normal. Mood & affect appropriate.   Data Reviewed: I have personally reviewed following labs and imaging studies  CBC: Recent Labs  Lab 05/12/24 1125 05/13/24 0338  WBC 21.8* 16.3*  NEUTROABS  --  12.8*  HGB 13.6 11.3*  HCT 41.3 35.1*  MCV 89.6 91.2  PLT 300 225    Basic Metabolic Panel: Recent Labs  Lab 05/12/24 1125 05/13/24 0338  NA 136 137  K 4.0 3.9  CL 102 104  CO2 22 24  GLUCOSE 150* 146*  BUN 19 19  CREATININE 1.28* 1.19*  CALCIUM  8.4* 7.7*  MG  --  1.8    CBG: Recent Labs  Lab 05/12/24 1744 05/12/24 2109 05/13/24 0324 05/13/24 0714 05/13/24 1122  GLUCAP 147* 166* 138* 142* 110*    Recent Results (from the past 240 hours)  Blood culture (routine x 2)     Status: None (Preliminary result)   Collection Time: 05/12/24 12:57 PM   Specimen: Right Antecubital; Blood  Result Value Ref Range Status   Specimen Description   Final    RIGHT ANTECUBITAL BOTTLES DRAWN AEROBIC AND ANAEROBIC   Special Requests Blood Culture adequate volume  Final   Culture   Final    NO GROWTH < 24 HOURS Performed at [facility], [address], [place], [place] [address]    Report Status PENDING  Incomplete     Radiology Studies: US  PELVIC COMPLETE W TRANSVAGINAL AND TORSION R/O Result Date:  05/12/2024 EXAM: US  Pelvis, Complete Transvaginal and Transabdominal without Doppler TECHNIQUE: Transabdominal and transvaginal pelvic duplex ultrasound using B-mode/gray scaled imaging without Doppler spectral analysis and color flow was obtained. COMPARISON: None provided CLINICAL HISTORY: Pelvic pain. Additional clinical history of acute appendicitis on CT from earlier today. FINDINGS: UTERUS: Small anteverted uterus measures 3.9 x 1.7 x 2.5 cm, uterus volume 9.4 cc. No uterine fibroids. ENDOMETRIAL STRIPE: Bilayer endometrial thickness 1 mm. No focal endometrial mass. Trace fluid in the fundal endometrial cavity. RIGHT OVARY: Right ovary measures 2.6 x 1.2 x 1.2 cm and is normal with normal arterial and venous flow on color and spectral Doppler. No adnexal mass. LEFT OVARY: Left ovary not discretely visualized. No adnexal mass. FREE FLUID: No significant free fluid in the pelvic cul-de-sac. IMPRESSION: 1. No acute findings. 2. Normal right ovary. Nonvisualization of left ovary. No adnexal masses. 3. Small anteverted uterus with trace fluid in the  fundal endometrial cavity. Normal endometrial thickness. Electronically signed by: Selinda Blue MD 05/12/2024 03:08 PM EDT RP Workstation: [id number]   CT ABDOMEN PELVIS W CONTRAST Result Date: 05/12/2024 EXAM: CT ABDOMEN AND PELVIS WITH CONTRAST 05/12/2024 01:20:37 PM TECHNIQUE: CT of the abdomen and pelvis was performed with the administration of 100 mL of intravenous iohexol  (OMNIPAQUE ) 300 MG/ML solution. Multiplanar reformatted images are provided for review. Automated exposure control, iterative reconstruction, and/or weight-based adjustment of the mA/kV was utilized to reduce the radiation dose to as low as reasonably achievable. COMPARISON: 04/28/2021 CLINICAL HISTORY: Abdominal pain, acute, nonlocalized; right lower abdominal pain that radiates to right flank. Patient states she is currently being treated for a UTI and started a fever last night of 101.4.  Patient states folding up a scooter and was worried about pulling a muscle. FINDINGS: LOWER CHEST: Moderate coronary calcification. Mild   aortic plaque. LIVER: Normal size and contour. GALLBLADDER AND BILE DUCTS: Gallbladder not identified. SPLEEN: Normal size. No focal lesion. PANCREAS: No mass. No ductal dilatation. ADRENAL GLANDS: Normal appearance. No mass. KIDNEYS, URETERS AND BLADDER: No stones in the kidneys or ureters. No hydronephrosis. No perinephric or periureteral stranding. Urinary bladder is unremarkable. GI AND BOWEL: Post gastric bypass surgery. Dilated fluid-filled appendix containing 2 calcified appendicoliths, with some mucosal enhancement. No definite perforation or abscess. Significant regional inflammatory changes however. Stomach demonstrates no acute abnormality. There is no bowel obstruction. No bowel wall thickening. PERITONEUM AND RETROPERITONEUM: No ascites. No free air. VASCULATURE: Infrarenal IVC filter with caval wall perforation by several legs, no definite adjacent organ involvement. Aorta is normal in caliber. LYMPH NODES: No lymphadenopathy. REPRODUCTIVE ORGANS: No significant abnormality. BONES AND SOFT TISSUES: bilateral hip arthroplasty. Moderate umbilical hernia containing mesenteric fat. Vertebral endplate spurring at multiple levels in the lower thoracic spine. Mild spondylitic changes in the lumbar spine. No acute osseous abnormality. No focal soft tissue abnormality. IMPRESSION: 1. Acute appendicitis. No definite perforation or abscess. Findings telephoned to Dr. Melvenia at time of interpretation. 2. Moderate umbilical hernia containing mesenteric fat. 3. Infrarenal IVC filter with caval wall perforation by several legs, without definite adjacent organ involvement. Electronically signed by: Dayne Hassell MD 05/12/2024 01:50 PM EDT RP Workstation: [id number]    Scheduled Meds:  acetaminophen   1,000 mg Oral Q6H   acidophilus  1 capsule Oral Daily   amitriptyline   100 mg  Oral QHS   Chlorhexidine  Gluconate Cloth  6 each Topical Daily   DULoxetine   60 mg Oral Daily   enoxaparin  (LOVENOX ) injection  75 mg Subcutaneous Q24H   fluticasone  furoate-vilanterol  1 puff Inhalation Daily   insulin  aspart  0-15 Units Subcutaneous TID WC   ketorolac   30 mg Intravenous Q12H   levothyroxine   125 mcg Oral q morning   lisinopril   5 mg Oral Daily   loratadine   10 mg Oral QHS   metoprolol  tartrate  12.5 mg Oral TID   montelukast   10 mg Oral QHS   nystatin    Topical BID   pantoprazole  (PROTONIX ) IV  40 mg Intravenous Q24H   pregabalin   225 mg Oral BID   progesterone   100 mg Oral Daily   QUEtiapine   50 mg Oral QHS   rosuvastatin   40 mg Oral Daily   topiramate   100 mg Oral BID   Continuous Infusions:  lactated ringers      piperacillin -tazobactam (ZOSYN )  IV 3.375 g (05/13/24 0555)     LOS: 0 days   Time spent: 58 mins  Ciarra Braddy, MD How to contact  the [facility] Attending or Consulting provider 7A - 7P or covering provider during after hours 7P -7A, for this patient?  Check the care team in [facility] and look for a) attending/consulting [facility] provider listed and b) the [facility] team listed Log into [email or web address] to find provider on call.  Locate the [facility] provider you are looking for under [facility] and page to a number that you can be directly reached. If you still have difficulty reaching the provider, please page the [facility] (Director on Call) for the Hospitalists listed on amion for assistance.  05/13/2024, 12:33 PM

## 2024-05-13 NOTE — Plan of Care (Signed)
  Problem: Education: Goal: Knowledge of General Education information will improve Description: Including pain rating scale, medication(s)/side effects and non-pharmacologic comfort measures Outcome: Progressing   Problem: Health Behavior/Discharge Planning: Goal: Ability to manage health-related needs will improve Outcome: Progressing   Problem: Clinical Measurements: Goal: Ability to maintain clinical measurements within normal limits will improve Outcome: Progressing Goal: Will remain free from infection Outcome: Progressing Goal: Diagnostic test results will improve Outcome: Progressing Goal: Respiratory complications will improve Outcome: Progressing Goal: Cardiovascular complication will be avoided Outcome: Progressing   Problem: Activity: Goal: Risk for activity intolerance will decrease Outcome: Progressing   Problem: Nutrition: Goal: Adequate nutrition will be maintained Outcome: Progressing   Problem: Coping: Goal: Level of anxiety will decrease Outcome: Progressing   Problem: Elimination: Goal: Will not experience complications related to bowel motility Outcome: Progressing Goal: Will not experience complications related to urinary retention Outcome: Progressing   Problem: Pain Managment: Goal: General experience of comfort will improve and/or be controlled Outcome: Progressing   Problem: Skin Integrity: Goal: Risk for impaired skin integrity will decrease Outcome: Progressing   Problem: Education: Goal: Ability to describe self-care measures that may prevent or decrease complications (Diabetes Survival Skills Education) will improve Outcome: Progressing Goal: Individualized Educational Video(s) Outcome: Progressing   Problem: Coping: Goal: Ability to adjust to condition or change in health will improve Outcome: Progressing   Problem: Health Behavior/Discharge Planning: Goal: Ability to identify and utilize available resources and services will  improve Outcome: Progressing Goal: Ability to manage health-related needs will improve Outcome: Progressing   Problem: Metabolic: Goal: Ability to maintain appropriate glucose levels will improve Outcome: Progressing   Problem: Nutritional: Goal: Maintenance of adequate nutrition will improve Outcome: Progressing Goal: Progress toward achieving an optimal weight will improve Outcome: Progressing

## 2024-05-13 NOTE — Progress Notes (Signed)
 Patient does not use CPAP at home and has been refusing here.  Patient is aware that we have them here if she changes her mind.

## 2024-05-13 NOTE — TOC Initial Note (Signed)
 Transition of Care [facility]) - Initial/Assessment Note    Patient Details  Name: Karen Lewis MRN: [id number] Date of Birth: 31-May-1971  Transition [facility]) CM/SW Contact:    Nena LITTIE Coffee, RN Phone Number: 05/13/2024, 5:10 PM  Clinical Narrative:                 Pt admitted c/acute appendicitis, lap appy c/[name] 05/12/2024. Assessed for high readmit score. Pt will return home at dc, husband will transport.   Expected Discharge Plan: Home/Self Care Barriers to Discharge: Continued Medical Work up   Patient Goals and CMS Choice Patient states their goals for this hospitalization and ongoing recovery are:: Return home          Expected Discharge Plan and Services In-house Referral: Clinical Social Work Discharge Planning Services: CM Consult Post Acute Care Choice: NA Living arrangements for the past 2 months: Single Family Home                                      Prior Living Arrangements/Services Living arrangements for the past 2 months: Single Family Home Lives with:: Spouse Patient language and need for interpreter reviewed:: Yes Do you feel safe going back to the place where you live?: Yes      Need for Family Participation in Patient Care: Yes (Comment) Care giver support system in place?: Yes (comment) Current home services: DME (rw, tub seat) Criminal Activity/Legal Involvement Pertinent to Current Situation/Hospitalization: No - Comment as needed  Activities of Daily Living   ADL Screening (condition at time of admission) Independently performs ADLs?: Yes (appropriate for developmental age) Is the patient deaf or have difficulty hearing?: No Does the patient have difficulty seeing, even when wearing glasses/contacts?: No Does the patient have difficulty concentrating, remembering, or making decisions?: No  Permission Sought/Granted                  Emotional Assessment Appearance:: Appears stated age Attitude/Demeanor/Rapport:  Engaged Affect (typically observed): Appropriate Orientation: : Oriented to Self, Oriented to Place, Oriented to  Time, Oriented to Situation Alcohol  / Substance Use: Not Applicable Psych Involvement: No (comment)  Admission diagnosis:  Acute appendicitis [K35.80] Acute appendicitis, unspecified acute appendicitis type [K35.80] Patient Active Problem List   Diagnosis Date Noted   Acute appendicitis 05/12/2024   Leukocytosis 05/12/2024   Colicky RLQ abdominal pain 05/12/2024   Fever, unspecified 05/12/2024   Ventral hernia without obstruction or gangrene 05/12/2024   Intra-abdominal adhesions 05/12/2024   Difficult intubation    Osteoarthritis of shoulder (Right) 04/30/2024   Arthralgia of acromioclavicular joint (Right) 04/30/2024   Osteoarthritis of AC (acromioclavicular) joint (Right) 04/30/2024   Osteoarthritis of glenohumeral joint (Right) 04/30/2024   Abnormal MRI, shoulder (02/09/2024) (Left) 03/12/2024   Osteoarthritis of glenohumeral joint (Left) 03/12/2024   Osteoarthritis of AC (acromioclavicular) joint (Left) 03/12/2024   Genital herpes simplex 03/12/2024   Genitourinary syndrome of menopause 03/12/2024   Mixed stress and urge urinary incontinence 03/12/2024   Abnormal MRI, cervical spine (01/17/2024) 02/07/2024   Cervical radiculopathy at C6 12/26/2023   Cervicalgia 12/26/2023   DDD (degenerative disc disease), cervical 12/26/2023   Skin laxity 11/30/2023   DDD (degenerative disc disease), lumbosacral 09/11/2023   Lumbar facet joint pain 07/07/2023   AKI (acute kidney injury) (HCC) 08/08/2022   Pre-syncope 08/07/2022   Chronic shoulder pain (Right) 05/24/2022   Lumbar interspinous bursitis (L2-L5) 04/29/2022  Spinal enthesopathy of lumbar region (HCC) 04/29/2022   Lumbar facet arthropathy (Multilevel) (Bilateral) 04/29/2022   Lumbar central spinal stenosis, w/o claudication (L2-3, L4-5, L5-S1) 04/29/2022   Lumbar foraminal stenosis (Bilateral: L2-3, L4-5) (L>R)  04/29/2022   Lumbosacral lateral recess stenosis (Bilateral: L4-5, L5-S1) 04/29/2022   Lumbosacral radiculopathy at L2 (Right) 04/13/2022   Abnormal MRI, lumbar spine (07/01/2022) 04/13/2022   Chronic thigh pain (Right) 04/13/2022   Weakness of leg (Right) 04/13/2022   Tricompartment osteoarthritis of knees (Bilateral) 03/25/2022   Difficulty sleeping 12/07/2021   Lumbosacral radiculopathy at S1 (Left) 10/26/2021   Osteoarthritis of lumbar spine without myelopathy or radiculopathy 06/03/2021   Chronic use of opiate for therapeutic purpose 01/21/2021   Colon cancer screening 12/04/2020   Barrett's esophagus without dysplasia 12/04/2020   Uncomplicated opioid dependence (HCC) 09/25/2020   Pain and numbness of left upper extremity 03/13/2020   Cervical radiculitis (Left) 03/13/2020   Pharmacologic therapy 02/19/2020   S/P THR (total hip replacement) (Right) (11/27/2019) 02/19/2020   Aftercare following joint replacement surgery 12/23/2019   History of total hip replacement (Bilateral) 11/27/2019   Osteoarthritis of knee (Bilateral) 09/14/2019   Presence of artificial hip joint, bilateral 09/14/2019   Body mass index (BMI) 70 or greater, adult (HCC) 09/14/2019   Morbid (severe) obesity due to excess calories (HCC) 09/14/2019   Essential (primary) hypertension 09/14/2019   Lymphedema 08/20/2019   Type II diabetes mellitus with complication (HCC) 08/16/2019   Atypical chest pain 08/13/2019   Heart palpitations 08/13/2019   Leg swelling 07/15/2019   Elevated LFTs 06/07/2019   Edema of both legs 06/04/2019   Hypocalcemia 06/04/2019   Adult failure to thrive syndrome 06/03/2019   Iron  deficiency anemia, unspecified 05/28/2019   Mycobacterium abscessus infection 02/22/2019   Surgical wound, non healing 09/27/2018   Wound infection after surgery 08/11/2018   Panniculitis 07/05/2018   Allergy to iodine 04/26/2018   H/O allergy to shellfish 04/26/2018   Encounter for dental examination  02/21/2018   Spondylosis without myelopathy or radiculopathy, lumbosacral region 02/21/2018   History of gastric bypass 02/15/2018   Depression 01/19/2018   Secondary Osteoarthritis of knee (Bilateral) (R>L) 01/02/2018   Encounter for dental exam and cleaning w/o abnormal findings 12/19/2017   Post op infection 12/05/2017   Osteoarthritis of hip (Right) 07/07/2017   Osteoarthritis of shoulder (Left) 05/31/2017   Upper extremity pain 04/21/2017   Chronic shoulder pain (Left) 04/21/2017   Chronic pain syndrome 12/02/2016   Neurogenic pain 12/02/2016   Chest pain with low risk of acute coronary syndrome 08/04/2016   SOB (shortness of breath) on exertion 08/04/2016   Dysphagia, unspecified 05/04/2016   Gastro-esophageal reflux disease without esophagitis 05/04/2016   GERD (gastroesophageal reflux disease) 05/04/2016   Vitamin D  insufficiency 11/27/2015   Nontoxic goiter 11/12/2015   Other psychoactive substance dependence, uncomplicated (HCC) 10/13/2015   Controlled drug dependence (HCC) 10/13/2015   Lumbar spondylosis 09/18/2015   Hypomagnesemia 08/27/2015   Class 3 severe obesity due to excess calories with serious comorbidity and body mass index (BMI) of 60.0 to 69.9 in adult 08/27/2015   Long term current use of opiate analgesic 07/29/2015   Long term prescription opiate use 07/29/2015   Opiate use 07/29/2015   Encounter for therapeutic drug level monitoring 07/29/2015   Opiate dependence (HCC) 07/29/2015   Chronic knee pain (1ry area of Pain) (Bilateral) (R>L) 07/29/2015   Chronic low back pain (3ry area of Pain) (Bilateral) (R>L) w/o sciatica 07/29/2015   Lumbar facet syndrome (Bilateral) (R>L) 07/29/2015  Secondary osteoarthritis of multiple sites 07/29/2015   Grade 1  Anterolisthesis of L3/L4 & L4/L5 (1.4 cm) 07/29/2015   Chronic hip pain s/p THR (2ry area of pain) (Bilateral) 07/29/2015   S/P THR (total hip replacement) (Left) 07/29/2015   History of methicillin resistant  staphylococcus aureus (MRSA) 07/29/2015   History of bariatric surgery 07/29/2015   History of methicillin resistant Staphylococcus aureus infection 07/29/2015   Eczema 05/21/2015   Headache, migraine 05/21/2015   Bilateral polycystic ovarian syndrome 05/21/2015   Disease of thyroid  gland 05/21/2015   Major depressive disorder, single episode, unspecified 05/21/2015   Dermatitis 05/21/2015   Obstructive apnea 04/30/2015   History of asthma 03/10/2015   Binge eating disorder 03/10/2015   Fibromyalgia 03/10/2015   Anxiety, generalized 03/10/2015   Insomnia, persistent 03/10/2015   Depression, major, recurrent, moderate (HCC) 03/10/2015   Avitaminosis D 04/30/2014   Hyperlipidemia, unspecified 04/25/2014   Hyperlipidemia due to type 2 diabetes mellitus (HCC) 04/25/2014   Uncomplicated asthma 03/19/2014   Asthma 03/19/2014   Goiter, nontoxic, multinodular 10/09/2013   Apnea, sleep 12/05/2012   History of surgical procedure 12/05/2012   Type 2 diabetes mellitus without complication, without long-term current use of insulin  (HCC) 12/05/2012   Hypertension associated with diabetes (HCC) 12/05/2012   Other specified postprocedural states 12/05/2012   PCP:  [name], MD Pharmacy:   [facility] - [place], [place] - [address] [place] [address] Phone: [phone] Fax: [phone]     Social Drivers of Health (SDOH) Social History: SDOH Screenings   Food Insecurity: No Food Insecurity (05/12/2024)  Recent Concern: Food Insecurity - Food Insecurity Present (03/12/2024)   Received from [facility]  Housing: High Risk (05/12/2024)  Transportation Needs: No Transportation Needs (05/12/2024)  Recent Concern: Transportation Needs - Unmet Transportation Needs (03/12/2024)   Received from [facility]  Utilities: Not At Risk (05/12/2024)  Alcohol  Screen: Low Risk  (12/14/2019)  Depression (PHQ2-9): Low Risk   (04/30/2024)  Financial Resource Strain: Medium Risk (03/12/2024)   Received from [facility]  Physical Activity: Inactive (12/14/2019)  Social Connections: Moderately Integrated (01/20/2022)  Stress: No Stress Concern Present (01/20/2022)  Tobacco Use: Low Risk  (05/12/2024)   SDOH Interventions:     Readmission Risk Interventions    05/13/2024    5:07 PM 08/09/2022   12:42 PM  Readmission Risk Prevention Plan  Transportation Screening Complete Complete  PCP or Specialist Appt within 5-7 Days Complete   Home Care Screening Complete   Medication Review (RN CM) Complete   Medication Review (RN Care Manager)  Complete  PCP or Specialist appointment within 3-5 days of discharge  Complete  HRI or Home Care Consult  Complete  SW Recovery Care/Counseling Consult  Complete  Palliative Care Screening  Not Applicable  Skilled Nursing Facility  Not Applicable

## 2024-05-13 NOTE — Discharge Instructions (Signed)
 Surgery Discharge Instructions  Activity  You are advised to go directly home from the hospital.  Restrict your activities and rest for a day.  Resume light activity tomorrow. No heavy lifting over 10 lbs or strenuous exercise.  Fluids and Diet Regular diet  Medications  If you have not had a bowel movement in 24 hours, take 2 tablespoons over the counter Milk of mag.             You May resume your blood thinners tomorrow (Aspirin, coumadin, or other).   Operative Site  You have skin staples in place.  These will be removed at your follow up visit. Ok to English as a second language teacher. Keep wound clean and dry. No baths or swimming. No lifting more than 10 pounds.  Contact Information: If you have questions or concerns, please call our office, [phone], Monday- Thursday 8AM-5PM and Friday 8AM-12Noon.  If it is after hours or on the weekend, please call [facility] Main Number, [phone], and ask to speak to the surgeon on call for [name] at [facility].   SPECIFIC COMPLICATIONS TO WATCH FOR: Inability to urinate Fever over 101? F by mouth Nausea and vomiting lasting longer than 24 hours. Pain not relieved by medication ordered Swelling around the operative site Increased redness, warmth, hardness, around operative area Numbness, tingling, or cold fingers or toes Blood -soaked dressing, (small amounts of oozing may be normal) Increasing and progressive drainage from surgical area or exam site

## 2024-05-13 NOTE — Care Management Obs Status (Signed)
 MEDICARE OBSERVATION STATUS NOTIFICATION   Patient Details  Name: Karen Lewis MRN: [id number] Date of Birth: 1971/01/13   Medicare Observation Status Notification Given:  Yes    Nena LITTIE Coffee, RN 05/13/2024, 12:48 PM

## 2024-05-14 DIAGNOSIS — R509 Fever, unspecified: Secondary | ICD-10-CM | POA: Diagnosis not present

## 2024-05-14 DIAGNOSIS — K3532 Acute appendicitis with perforation and localized peritonitis, without abscess: Secondary | ICD-10-CM | POA: Diagnosis not present

## 2024-05-14 DIAGNOSIS — D72829 Elevated white blood cell count, unspecified: Secondary | ICD-10-CM | POA: Diagnosis not present

## 2024-05-14 DIAGNOSIS — G894 Chronic pain syndrome: Secondary | ICD-10-CM | POA: Diagnosis not present

## 2024-05-14 LAB — GLUCOSE, CAPILLARY
Glucose-Capillary: 171 mg/dL — ABNORMAL HIGH (ref 70–99)
Glucose-Capillary: 142 mg/dL — ABNORMAL HIGH (ref 70–99)
Glucose-Capillary: 156 mg/dL — ABNORMAL HIGH (ref 70–99)
Glucose-Capillary: 152 mg/dL — ABNORMAL HIGH (ref 70–99)
Glucose-Capillary: 201 mg/dL — ABNORMAL HIGH (ref 70–99)

## 2024-05-14 LAB — BASIC METABOLIC PANEL WITH GFR
Anion gap: 9 (ref 5–15)
BUN: 19 mg/dL (ref 6–20)
CO2: 23 mmol/L (ref 22–32)
Calcium: 7.2 mg/dL — ABNORMAL LOW (ref 8.9–10.3)
Chloride: 102 mmol/L (ref 98–111)
Creatinine, Ser: 1.28 mg/dL — ABNORMAL HIGH (ref 0.44–1.00)
GFR, Estimated: 50 mL/min — ABNORMAL LOW (ref >60)
Glucose, Bld: 244 mg/dL — ABNORMAL HIGH (ref 70–99)
Potassium: 4.1 mmol/L (ref 3.5–5.1)
Sodium: 134 mmol/L — ABNORMAL LOW (ref 135–145)

## 2024-05-14 LAB — CBC WITH DIFFERENTIAL/PLATELET
Abs Immature Granulocytes: 0.05 K/uL (ref 0.00–0.07)
Basophils Absolute: 0 K/uL (ref 0.0–0.1)
Basophils Relative: 0 %
Eosinophils Absolute: 0.2 K/uL (ref 0.0–0.5)
Eosinophils Relative: 1 %
HCT: 33.7 % — ABNORMAL LOW (ref 36.0–46.0)
Hemoglobin: 10.5 g/dL — ABNORMAL LOW (ref 12.0–15.0)
Immature Granulocytes: 0 %
Lymphocytes Relative: 10 %
Lymphs Abs: 1.1 K/uL (ref 0.7–4.0)
MCH: 28.9 pg (ref 26.0–34.0)
MCHC: 31.2 g/dL (ref 30.0–36.0)
MCV: 92.8 fL (ref 80.0–100.0)
Monocytes Absolute: 0.5 K/uL (ref 0.1–1.0)
Monocytes Relative: 4 %
Neutro Abs: 9.4 K/uL — ABNORMAL HIGH (ref 1.7–7.7)
Neutrophils Relative %: 85 %
Platelets: 209 K/uL (ref 150–400)
RBC: 3.63 MIL/uL — ABNORMAL LOW (ref 3.87–5.11)
RDW: 13.2 % (ref 11.5–15.5)
WBC: 11.2 K/uL — ABNORMAL HIGH (ref 4.0–10.5)
nRBC: 0 % (ref 0.0–0.2)

## 2024-05-14 LAB — MAGNESIUM: Magnesium: 2.1 mg/dL (ref 1.7–2.4)

## 2024-05-14 MED ORDER — ESTRADIOL 0.05 MG/24HR TD PTWK
0.0500 mg | MEDICATED_PATCH | TRANSDERMAL | Status: DC
  Filled 2024-05-14: qty 1

## 2024-05-14 MED ORDER — OXYCODONE HCL 5 MG PO TABS
10.0000 mg | ORAL_TABLET | Freq: Four times a day (QID) | ORAL | Status: DC | PRN
  Administered 2024-05-14 – 2024-05-15 (×4): 15 mg via ORAL
  Filled 2024-05-14 (×4): qty 3

## 2024-05-14 MED ORDER — KETOROLAC TROMETHAMINE 30 MG/ML IJ SOLN
15.0000 mg | Freq: Two times a day (BID) | INTRAMUSCULAR | Status: DC
  Administered 2024-05-14 (×2): 15 mg via INTRAVENOUS
  Filled 2024-05-14 (×3): qty 1

## 2024-05-14 MED ORDER — OXYCODONE HCL 5 MG PO TABS: 10.0000 mg | ORAL_TABLET | Freq: Four times a day (QID) | ORAL | Status: DC | PRN

## 2024-05-14 MED ORDER — METHOCARBAMOL 500 MG PO TABS: 500.0000 mg | ORAL_TABLET | Freq: Three times a day (TID) | ORAL | 0 refills | Status: AC

## 2024-05-14 MED ORDER — LACTATED RINGERS IV SOLN: INTRAVENOUS | Status: AC

## 2024-05-14 MED ORDER — ACETAMINOPHEN 500 MG PO TABS: 1000.0000 mg | ORAL_TABLET | Freq: Four times a day (QID) | ORAL | 0 refills | Status: AC

## 2024-05-14 MED ORDER — POLYETHYLENE GLYCOL 3350 17 G PO PACK
17.0000 g | PACK | Freq: Every evening | ORAL | Status: DC
  Administered 2024-05-14 – 2024-05-15 (×2): 17 g via ORAL
  Filled 2024-05-14 (×2): qty 1

## 2024-05-14 NOTE — Progress Notes (Signed)
 PROGRESS NOTE   Karen Lewis  FMW:[id number] DOB: May 28, 1971 DOA: 05/12/2024 PCP: [name], MD   Chief Complaint  Patient presents with   Abdominal Pain   Level of care: Med-Surg  Brief Admission History:  53 year old female with PCOS, morbid obesity status post Roux-en-Y gastric bypass and laparoscopic gastric sleeve, fibromyalgia, chronic pain, opioid dependence, sleep apnea not on CPAP, GAD, degenerative arthritis of knees and shoulders, asthma, type 2 diabetes mellitus, GERD, gonalgia, hypothyroidism, HSV, difficult intubation, cephalgia who reports that she was treated for UTI about 5 days ago with a course of Augmentin .  About 1 week ago she was having symptoms of right lower quadrant abdominal pain and thought it was related to the UTI versus muscle strain.  She started having fever overnight.  Her temperature was reportedly 101.4 at home.  Patient was evaluated in the emergency department today and CT scanning demonstrates acute appendicitis.  She has been evaluated by the surgical team and they are planning to take her to the OR later today.  She is being admitted to the stepdown ICU bed due to history of difficult intubation, multiple medical comorbidities and need for close postoperative monitoring.   Assessment and Plan:  Acute appendicitis with perforated appendix -- postop s/p robotic appendectomy 8/30 with [name] -- working on postop pain management -- stop IV dilaudid  and manage with oral oxycodone  in anticipation of going home  -- antibiotics for 5 days postop per surgery recommendations -- pt taken off IV pain management and transitioning to oral care regimen  Controlled Type 2 diabetes mellitus with neurological complications -- advanced diet after surgery  -- monitoring CBG with SSI coverage for now -- follow up A1c test -- 6.6%  CBG (last 3)  Recent Labs    05/14/24 0314 05/14/24 0725 05/14/24 1111  GLUCAP 171* 142* 156*    Diabetic  neuropathy -- resumed home pregabalin    Hypomagnesemia -- repleted    Sleep Apnea -- reportedly not on CPAP -- ordered CPAP autotitrated while inpatient   Chronic Pain Opioid dependence Fibromyalgia  -- pt taken off IV pain management and transitioned to oral care regimen    Essential hypertension  -- resume home meds when reconciled and able to take p.o.  -- IV hydralazine  ordered PRN for elevated BPs   Hyperlipidemia -- resume home rosuvastatin    Leukocytosis -- Improving  -- secondary to acute appendicitis -- WBC trending down after surgery  -- follow up blood culture -- continue IV antibiotics but plan to DC home on augmentin   -- follow blood culture, lactic acid is reassuring at 1.0   Class 3 obesity  -- s/p Roux-en-Y Gastric bypass and gastric sleeve procedure -- has been on semaglutide -- she is working closely with her PCP on weight loss journey -- supportive management for now while inpatient    DVT prophylaxis: enoxaparin   Code Status: Full  Family Communication:  Disposition: anticipate home    Consultants:  Surgery   Procedures:  Robotic appendectomy 05/12/24   Antimicrobials:    Subjective: Pt wanting to ambulate more today and tolerating diet well.     Objective: Vitals:   05/13/24 2030 05/13/24 2030 05/14/24 0318 05/14/24 0729  BP: 124/73 124/73 (!) 113/59   Pulse: 84 83 99   Resp:  20 20   Temp:  99.3 F (37.4 C) 98 F (36.7 C)   TempSrc:  Oral Oral   SpO2:  98%  98%  Weight:      Height:  Intake/Output Summary (Last 24 hours) at 05/14/2024 1319 Last data filed at 05/14/2024 0535 Gross per 24 hour  Intake 1048.2 ml  Output --  Net 1048.2 ml   Filed Weights   05/12/24 1124 05/12/24 1717  Weight: (!) 152.9 kg (!) 155.4 kg   Examination:  General exam: Appears calm but uncomfortable, does not appear to be in distress.   Respiratory system: Clear to auscultation. Respiratory effort normal. Cardiovascular system: normal S1 &  S2 heard. No JVD, murmurs, rubs, gallops or clicks. No pedal edema. Gastrointestinal system: Abdomen is obese, nondistended, soft and light tenderness, wounds clean and dry. No masses felt. Normal bowel sounds heard. Central nervous system: Alert and oriented. No focal neurological deficits. Extremities: Symmetric 5 x 5 power. Skin: No rashes, lesions or ulcers. Psychiatry: Judgement and insight appear normal. Mood & affect appropriate.   Data Reviewed: I have personally reviewed following labs and imaging studies  CBC: Recent Labs  Lab 05/12/24 1125 05/13/24 0338 05/14/24 0402  WBC 21.8* 16.3* 11.2*  NEUTROABS  --  12.8* 9.4*  HGB 13.6 11.3* 10.5*  HCT 41.3 35.1* 33.7*  MCV 89.6 91.2 92.8  PLT 300 225 209    Basic Metabolic Panel: Recent Labs  Lab 05/12/24 1125 05/13/24 0338 05/14/24 0402  NA 136 137 134*  K 4.0 3.9 4.1  CL 102 104 102  CO2 22 24 23   GLUCOSE 150* 146* 244*  BUN 19 19 19   CREATININE 1.28* 1.19* 1.28*  CALCIUM  8.4* 7.7* 7.2*  MG  --  1.8 2.1    CBG: Recent Labs  Lab 05/13/24 1612 05/13/24 2032 05/14/24 0314 05/14/24 0725 05/14/24 1111  GLUCAP 163* 149* 171* 142* 156*    Recent Results (from the past 240 hours)  Blood culture (routine x 2)     Status: None (Preliminary result)   Collection Time: 05/12/24 12:57 PM   Specimen: Right Antecubital; Blood  Result Value Ref Range Status   Specimen Description   Final    RIGHT ANTECUBITAL BOTTLES DRAWN AEROBIC AND ANAEROBIC   Special Requests Blood Culture adequate volume  Final   Culture   Final    NO GROWTH 2 DAYS Performed at [facility], [address], [place], [place] [address]    Report Status PENDING  Incomplete     Radiology Studies: US  PELVIC COMPLETE W TRANSVAGINAL AND TORSION R/O Result Date: 05/12/2024 EXAM: US  Pelvis, Complete Transvaginal and Transabdominal without Doppler TECHNIQUE: Transabdominal and transvaginal pelvic duplex ultrasound using B-mode/gray scaled imaging  without Doppler spectral analysis and color flow was obtained. COMPARISON: None provided CLINICAL HISTORY: Pelvic pain. Additional clinical history of acute appendicitis on CT from earlier today. FINDINGS: UTERUS: Small anteverted uterus measures 3.9 x 1.7 x 2.5 cm, uterus volume 9.4 cc. No uterine fibroids. ENDOMETRIAL STRIPE: Bilayer endometrial thickness 1 mm. No focal endometrial mass. Trace fluid in the fundal endometrial cavity. RIGHT OVARY: Right ovary measures 2.6 x 1.2 x 1.2 cm and is normal with normal arterial and venous flow on color and spectral Doppler. No adnexal mass. LEFT OVARY: Left ovary not discretely visualized. No adnexal mass. FREE FLUID: No significant free fluid in the pelvic cul-de-sac. IMPRESSION: 1. No acute findings. 2. Normal right ovary. Nonvisualization of left ovary. No adnexal masses. 3. Small anteverted uterus with trace fluid in the fundal endometrial cavity. Normal endometrial thickness. Electronically signed by: Selinda Blue MD 05/12/2024 03:08 PM EDT RP Workstation: [id number]   CT ABDOMEN PELVIS W CONTRAST Result Date: 05/12/2024 EXAM: CT ABDOMEN AND  PELVIS WITH CONTRAST 05/12/2024 01:20:37 PM TECHNIQUE: CT of the abdomen and pelvis was performed with the administration of 100 mL of intravenous iohexol  (OMNIPAQUE ) 300 MG/ML solution. Multiplanar reformatted images are provided for review. Automated exposure control, iterative reconstruction, and/or weight-based adjustment of the mA/kV was utilized to reduce the radiation dose to as low as reasonably achievable. COMPARISON: 04/28/2021 CLINICAL HISTORY: Abdominal pain, acute, nonlocalized; right lower abdominal pain that radiates to right flank. Patient states she is currently being treated for a UTI and started a fever last night of 101.4. Patient states folding up a scooter and was worried about pulling a muscle. FINDINGS: LOWER CHEST: Moderate coronary calcification. Mild   aortic plaque. LIVER: Normal size and contour.  GALLBLADDER AND BILE DUCTS: Gallbladder not identified. SPLEEN: Normal size. No focal lesion. PANCREAS: No mass. No ductal dilatation. ADRENAL GLANDS: Normal appearance. No mass. KIDNEYS, URETERS AND BLADDER: No stones in the kidneys or ureters. No hydronephrosis. No perinephric or periureteral stranding. Urinary bladder is unremarkable. GI AND BOWEL: Post gastric bypass surgery. Dilated fluid-filled appendix containing 2 calcified appendicoliths, with some mucosal enhancement. No definite perforation or abscess. Significant regional inflammatory changes however. Stomach demonstrates no acute abnormality. There is no bowel obstruction. No bowel wall thickening. PERITONEUM AND RETROPERITONEUM: No ascites. No free air. VASCULATURE: Infrarenal IVC filter with caval wall perforation by several legs, no definite adjacent organ involvement. Aorta is normal in caliber. LYMPH NODES: No lymphadenopathy. REPRODUCTIVE ORGANS: No significant abnormality. BONES AND SOFT TISSUES: bilateral hip arthroplasty. Moderate umbilical hernia containing mesenteric fat. Vertebral endplate spurring at multiple levels in the lower thoracic spine. Mild spondylitic changes in the lumbar spine. No acute osseous abnormality. No focal soft tissue abnormality. IMPRESSION: 1. Acute appendicitis. No definite perforation or abscess. Findings telephoned to Dr. Melvenia at time of interpretation. 2. Moderate umbilical hernia containing mesenteric fat. 3. Infrarenal IVC filter with caval wall perforation by several legs, without definite adjacent organ involvement. Electronically signed by: Dayne Hassell MD 05/12/2024 01:50 PM EDT RP Workstation: [id number]    Scheduled Meds:  acetaminophen   1,000 mg Oral Q6H   acidophilus  1 capsule Oral Daily   amitriptyline   100 mg Oral QHS   Chlorhexidine  Gluconate Cloth  6 each Topical Daily   DULoxetine   60 mg Oral Daily   enoxaparin  (LOVENOX ) injection  75 mg Subcutaneous Q24H   estradiol   0.05 mg  Transdermal Once per day on Monday Thursday   fluticasone  furoate-vilanterol  1 puff Inhalation Daily   insulin  aspart  0-15 Units Subcutaneous TID WC   ketorolac   15 mg Intravenous Q12H   levothyroxine   125 mcg Oral q morning   lisinopril   5 mg Oral Daily   loratadine   10 mg Oral QHS   methocarbamol   500 mg Oral TID   metoprolol  tartrate  12.5 mg Oral TID   montelukast   10 mg Oral QHS   nystatin    Topical BID   pantoprazole   40 mg Oral QPM   pregabalin   225 mg Oral BID   progesterone   100 mg Oral Daily   QUEtiapine   50 mg Oral QHS   rosuvastatin   40 mg Oral Daily   topiramate   100 mg Oral BID   Continuous Infusions:  piperacillin -tazobactam (ZOSYN )  IV 3.375 g (05/14/24 0552)     LOS: 1 day   Time spent: 53 mins  Tammera Engert Vicci, MD How to contact the [facility] Attending or Consulting provider 7A - 7P or covering provider during after hours 7P -7A, for this patient?  Check the care team in [facility] and look for a) attending/consulting TRH provider listed and b) the TRH team listed Log into [email or web address] to find provider on call.  Locate the TRH provider you are looking for under [facility] and page to a number that you can be directly reached. If you still have difficulty reaching the provider, please page the [facility] (Director on Call) for the Hospitalists listed on amion for assistance.  05/14/2024, 1:19 PM

## 2024-05-14 NOTE — Plan of Care (Signed)

## 2024-05-14 NOTE — Progress Notes (Signed)
 [facility] Progress Note  2 Days Post-Op  Subjective: Patient seen and examined.  She is resting comfortably in bed.  She states she had some nausea with one of her meals yesterday, but was able to tolerate her breakfast without nausea and vomiting.  She confirms passing flatus but denies any bowel movement since surgery.  Her pain has been slightly better controlled since additions of Toradol  and Robaxin , but she still required IV pain medications this morning.  Objective: Vital signs in last 24 hours: Temp:  [98 F (36.7 C)-99.3 F (37.4 C)] 98 F (36.7 C) (09/01 0318) Pulse Rate:  [83-99] 99 (09/01 0318) Resp:  [20] 20 (09/01 0318) BP: (95-128)/(55-73) 113/59 (09/01 0318) SpO2:  [95 %-98 %] 98 % (09/01 0729)    Intake/Output from previous day: 08/31 0701 - 09/01 0700 In: 1288.2 [P.O.:360; I.V.:778.2; IV Piggyback:150] Out: 2100 [Urine:2100] Intake/Output this shift: No intake/output data recorded.  General appearance: alert, cooperative, and no distress GI: Abdomen soft, nondistended, no percussion tenderness, mild incisional and midline tenderness to palpation; no rigidity, guarding, rebound tenderness; laparoscopic incision sites C/D/I with skin staples in place  Lab Results:  Recent Labs    05/13/24 0338 05/14/24 0402  WBC 16.3* 11.2*  HGB 11.3* 10.5*  HCT 35.1* 33.7*  PLT 225 209   BMET Recent Labs    05/13/24 0338 05/14/24 0402  NA 137 134*  K 3.9 4.1  CL 104 102  CO2 24 23  GLUCOSE 146* 244*  BUN 19 19  CREATININE 1.19* 1.28*  CALCIUM  7.7* 7.2*   PT/INR No results for input(s): LABPROT, INR in the last 72 hours.  Studies/Results: US  PELVIC COMPLETE W TRANSVAGINAL AND TORSION R/O Result Date: 05/12/2024 EXAM: US  Pelvis, Complete Transvaginal and Transabdominal without Doppler TECHNIQUE: Transabdominal and transvaginal pelvic duplex ultrasound using B-mode/gray scaled imaging without Doppler spectral analysis and color flow was  obtained. COMPARISON: None provided CLINICAL HISTORY: Pelvic pain. Additional clinical history of acute appendicitis on CT from earlier today. FINDINGS: UTERUS: Small anteverted uterus measures 3.9 x 1.7 x 2.5 cm, uterus volume 9.4 cc. No uterine fibroids. ENDOMETRIAL STRIPE: Bilayer endometrial thickness 1 mm. No focal endometrial mass. Trace fluid in the fundal endometrial cavity. RIGHT OVARY: Right ovary measures 2.6 x 1.2 x 1.2 cm and is normal with normal arterial and venous flow on color and spectral Doppler. No adnexal mass. LEFT OVARY: Left ovary not discretely visualized. No adnexal mass. FREE FLUID: No significant free fluid in the pelvic cul-de-sac. IMPRESSION: 1. No acute findings. 2. Normal right ovary. Nonvisualization of left ovary. No adnexal masses. 3. Small anteverted uterus with trace fluid in the fundal endometrial cavity. Normal endometrial thickness. Electronically signed by: Selinda Blue MD 05/12/2024 03:08 PM EDT RP Workstation: [id number]   CT ABDOMEN PELVIS W CONTRAST Result Date: 05/12/2024 EXAM: CT ABDOMEN AND PELVIS WITH CONTRAST 05/12/2024 01:20:37 PM TECHNIQUE: CT of the abdomen and pelvis was performed with the administration of 100 mL of intravenous iohexol  (OMNIPAQUE ) 300 MG/ML solution. Multiplanar reformatted images are provided for review. Automated exposure control, iterative reconstruction, and/or weight-based adjustment of the mA/kV was utilized to reduce the radiation dose to as low as reasonably achievable. COMPARISON: 04/28/2021 CLINICAL HISTORY: Abdominal pain, acute, nonlocalized; right lower abdominal pain that radiates to right flank. Patient states she is currently being treated for a UTI and started a fever last night of 101.4. Patient states folding up a scooter and was worried about pulling a muscle. FINDINGS: LOWER CHEST: Moderate coronary calcification. Mild  aortic plaque. LIVER: Normal size and contour. GALLBLADDER AND BILE DUCTS: Gallbladder not identified.  SPLEEN: Normal size. No focal lesion. PANCREAS: No mass. No ductal dilatation. ADRENAL GLANDS: Normal appearance. No mass. KIDNEYS, URETERS AND BLADDER: No stones in the kidneys or ureters. No hydronephrosis. No perinephric or periureteral stranding. Urinary bladder is unremarkable. GI AND BOWEL: Post gastric bypass surgery. Dilated fluid-filled appendix containing 2 calcified appendicoliths, with some mucosal enhancement. No definite perforation or abscess. Significant regional inflammatory changes however. Stomach demonstrates no acute abnormality. There is no bowel obstruction. No bowel wall thickening. PERITONEUM AND RETROPERITONEUM: No ascites. No free air. VASCULATURE: Infrarenal IVC filter with caval wall perforation by several legs, no definite adjacent organ involvement. Aorta is normal in caliber. LYMPH NODES: No lymphadenopathy. REPRODUCTIVE ORGANS: No significant abnormality. BONES AND SOFT TISSUES: bilateral hip arthroplasty. Moderate umbilical hernia containing mesenteric fat. Vertebral endplate spurring at multiple levels in the lower thoracic spine. Mild spondylitic changes in the lumbar spine. No acute osseous abnormality. No focal soft tissue abnormality. IMPRESSION: 1. Acute appendicitis. No definite perforation or abscess. Findings telephoned to Dr. Melvenia at time of interpretation. 2. Moderate umbilical hernia containing mesenteric fat. 3. Infrarenal IVC filter with caval wall perforation by several legs, without definite adjacent organ involvement. Electronically signed by: Dayne Hassell MD 05/12/2024 01:50 PM EDT RP Workstation: [id number]    Anti-infectives: Anti-infectives (From admission, onward)    Start     Dose/Rate Route Frequency Ordered Stop   05/12/24 2200  piperacillin -tazobactam (ZOSYN ) IVPB 3.375 g        3.375 g 12.5 mL/hr over 240 Minutes Intravenous Every 8 hours 05/12/24 1447     05/12/24 1400  piperacillin -tazobactam (ZOSYN ) IVPB 3.375 g        3.375 g 100 mL/hr  over 30 Minutes Intravenous  Once 05/12/24 1346 05/12/24 1453       Assessment/Plan:  Patient is a 53 year old female who was admitted with acute appendicitis.  She is status post robotic assisted laparoscopic appendectomy on 8/31 for acute perforated appendicitis.   -Leukocytosis is slightly downtrending today to 11.2 from 16.3 -Continue IV Zosyn .  Will need a total of 5 days of antibiotic treatment.  Would recommend patient be discharged home with Augmentin  -Continue regular diet -Monitor for bowel function -Continue scheduled Tylenol , Lyrica , Zanaflex , Topamax , Robaxin , and Toradol .   -Continue as needed oxycodone  and Dilaudid .     -Patient will need to follow-up with her pain clinic specialist this week after discharge regarding further narcotics, as I am limited with how much I can send her home with postoperatively. -Discussed that we will evaluate patient's ability to be discharged home later today versus tomorrow -Appreciate hospitalist recommendations   LOS: 1 day    Deleah Tison A Starla Deller 05/14/2024  Note: Portions of this report may have been transcribed using voice recognition software. Every effort has been made to ensure accuracy; however, inadvertent computerized transcription errors may still be present.

## 2024-05-14 NOTE — Evaluation (Signed)
 Physical Therapy Evaluation Patient Details Name: Karen Lewis MRN: [id number] DOB: 1971-06-21 Today's Date: 05/14/2024  History of Present Illness  Per MD on 8/30: 53 year old female with PCOS, morbid obesity status post Roux-en-Y gastric bypass and laparoscopic gastric sleeve, fibromyalgia, chronic pain, opioid dependence, sleep apnea not on CPAP, GAD, degenerative arthritis of knees and shoulders, asthma, type 2 diabetes mellitus, GERD, gonalgia, hypothyroidism, HSV, difficult intubation, cephalgia who reports that she was treated for UTI about 5 days ago with a course of Augmentin .  About 1 week ago she was having symptoms of right lower quadrant abdominal pain and thought it was related to the UTI versus muscle strain.  She started having fever overnight.  Her temperature was reportedly 101.4 at home.  Patient was evaluated in the emergency department today and CT scanning demonstrates acute appendicitis.    Patient had an appendectomy on 05/13/24.  Clinical Impression  Pt states she has already been walking down the hall but she has pain.  Pt has a ramp, uses a rollator household walking and a scooter for community distances.  Pt has decreased mobility due to operation but does not need skilled care.         If plan is discharge home, recommend the following: Help with stairs or ramp for entrance;Assistance with cooking/housework   Can travel by private vehicle    yes    Equipment Recommendations None recommended by PT  Recommendations for Other Services       Functional Status Assessment Patient has had a recent decline in their functional status and demonstrates the ability to make significant improvements in function in a reasonable and predictable amount of time.     Precautions / Restrictions Precautions Precautions: None Restrictions Weight Bearing Restrictions Per Provider Order: No      Mobility  Bed Mobility Overal bed mobility: Modified Independent                   Transfers   Equipment used: Rolling walker (2 wheels)                    Ambulation/Gait Ambulation/Gait assistance: Modified independent (Device/Increase time) Gait Distance (Feet): 150 Feet Assistive device: Rolling walker (2 wheels) Gait Pattern/deviations: Decreased step length - left, Decreased step length - right   Gait velocity interpretation: <1.8 ft/sec, indicate of risk for recurrent falls   General Gait Details: PT needed to stop twice for about 10-15 seconds then resumed walking.  Pt uses a scooter for community ambulation       Pertinent Vitals/Pain Pain Assessment Pain Assessment: 0-10 Pain Score: 6  Pain Descriptors / Indicators: Aching Pain Intervention(s): Limited activity within patient's tolerance    Home Living Family/patient expects to be discharged to:: Private residence Living Arrangements: Spouse/significant other Available Help at Discharge: Family;Available 24 hours/day Type of Home: House Home Access: Ramped entrance       Home Layout: One level Home Equipment: Rollator (4 wheels);Electric scooter      Prior Function Prior Level of Function : Independent/Modified Engineer, maintenance (IT) Arousal: Alert Behavior During Therapy: WFL for tasks assessed/performed   PT - Cognitive impairments: No apparent impairments                         Following commands: Intact  Assessment/Plan    PT Assessment Patient does not need any further PT services                AM-PAC PT 6 Clicks Mobility  Outcome Measure Help needed turning from your back to your side while in a flat bed without using bedrails?: None Help needed moving from lying on your back to sitting on the side of a flat bed without using bedrails?: None Help needed moving to and from a bed to a chair (including a wheelchair)?: None Help needed standing up from a chair  using your arms (e.g., wheelchair or bedside chair)?: None Help needed to walk in hospital room?: A Little Help needed climbing 3-5 steps with a railing? : A Little 6 Click Score: 22    End of Session Equipment Utilized During Treatment: Gait belt Activity Tolerance: Patient limited by fatigue Patient left: in chair;with call bell/phone within reach Nurse Communication: Mobility status PT Visit Diagnosis: Unsteadiness on feet (R26.81)    Time: 1130-1145 PT Time Calculation (min) (ACUTE ONLY): 15 min   Charges:   PT Evaluation $PT Eval Low Complexity: 1 Low   PT General Charges $$ ACUTE PT VISIT: 1 Visit       Montie Metro, PT CLT [phone]  05/14/2024, 12:51 PM

## 2024-05-15 ENCOUNTER — Inpatient Hospital Stay (HOSPITAL_COMMUNITY)

## 2024-05-15 ENCOUNTER — Encounter (HOSPITAL_COMMUNITY): Payer: Self-pay | Admitting: Surgery

## 2024-05-15 ENCOUNTER — Encounter: Payer: Self-pay | Admitting: Oncology

## 2024-05-15 DIAGNOSIS — K3532 Acute appendicitis with perforation and localized peritonitis, without abscess: Secondary | ICD-10-CM | POA: Diagnosis not present

## 2024-05-15 DIAGNOSIS — R509 Fever, unspecified: Secondary | ICD-10-CM | POA: Diagnosis not present

## 2024-05-15 DIAGNOSIS — D72829 Elevated white blood cell count, unspecified: Secondary | ICD-10-CM | POA: Diagnosis not present

## 2024-05-15 DIAGNOSIS — G894 Chronic pain syndrome: Secondary | ICD-10-CM | POA: Diagnosis not present

## 2024-05-15 LAB — GLUCOSE, CAPILLARY
Glucose-Capillary: 135 mg/dL — ABNORMAL HIGH (ref 70–99)
Glucose-Capillary: 145 mg/dL — ABNORMAL HIGH (ref 70–99)
Glucose-Capillary: 222 mg/dL — ABNORMAL HIGH (ref 70–99)
Glucose-Capillary: 175 mg/dL — ABNORMAL HIGH (ref 70–99)
Glucose-Capillary: 156 mg/dL — ABNORMAL HIGH (ref 70–99)
Glucose-Capillary: 150 mg/dL — ABNORMAL HIGH (ref 70–99)

## 2024-05-15 LAB — BASIC METABOLIC PANEL WITH GFR
Anion gap: 8 (ref 5–15)
BUN: 17 mg/dL (ref 6–20)
CO2: 23 mmol/L (ref 22–32)
Calcium: 7.2 mg/dL — ABNORMAL LOW (ref 8.9–10.3)
Chloride: 104 mmol/L (ref 98–111)
Creatinine, Ser: 1.36 mg/dL — ABNORMAL HIGH (ref 0.44–1.00)
GFR, Estimated: 47 mL/min — ABNORMAL LOW (ref >60)
Glucose, Bld: 160 mg/dL — ABNORMAL HIGH (ref 70–99)
Potassium: 4.5 mmol/L (ref 3.5–5.1)
Sodium: 135 mmol/L (ref 135–145)

## 2024-05-15 LAB — CBC
HCT: 36.2 % (ref 36.0–46.0)
Hemoglobin: 11.4 g/dL — ABNORMAL LOW (ref 12.0–15.0)
MCH: 29.2 pg (ref 26.0–34.0)
MCHC: 31.5 g/dL (ref 30.0–36.0)
MCV: 92.8 fL (ref 80.0–100.0)
Platelets: 265 K/uL (ref 150–400)
RBC: 3.9 MIL/uL (ref 3.87–5.11)
RDW: 13.3 % (ref 11.5–15.5)
WBC: 11 K/uL — ABNORMAL HIGH (ref 4.0–10.5)
nRBC: 0 % (ref 0.0–0.2)

## 2024-05-15 LAB — ALBUMIN: Albumin: 2.7 g/dL — ABNORMAL LOW (ref 3.5–5.0)

## 2024-05-15 MED ORDER — SENNOSIDES-DOCUSATE SODIUM 8.6-50 MG PO TABS
1.0000 | ORAL_TABLET | Freq: Two times a day (BID) | ORAL | Status: DC
  Administered 2024-05-15 – 2024-05-16 (×3): 1 via ORAL
  Filled 2024-05-15 (×3): qty 1

## 2024-05-15 MED ORDER — OXYCODONE HCL 5 MG PO TABS
10.0000 mg | ORAL_TABLET | ORAL | Status: DC | PRN
  Administered 2024-05-15 – 2024-05-16 (×2): 10 mg via ORAL
  Filled 2024-05-15 (×2): qty 2

## 2024-05-15 MED ORDER — SIMETHICONE 80 MG PO CHEW
80.0000 mg | CHEWABLE_TABLET | Freq: Four times a day (QID) | ORAL | Status: DC | PRN
  Administered 2024-05-15: 80 mg via ORAL
  Filled 2024-05-15 (×2): qty 1

## 2024-05-15 MED ORDER — METOPROLOL TARTRATE 25 MG PO TABS
25.0000 mg | ORAL_TABLET | Freq: Two times a day (BID) | ORAL | Status: DC
  Administered 2024-05-15 – 2024-05-16 (×2): 25 mg via ORAL
  Filled 2024-05-15 (×2): qty 1

## 2024-05-15 MED ORDER — BISACODYL 10 MG RE SUPP
10.0000 mg | Freq: Every day | RECTAL | Status: DC
  Administered 2024-05-15 – 2024-05-19 (×4): 10 mg via RECTAL
  Filled 2024-05-15 (×7): qty 1

## 2024-05-15 MED ORDER — CALCIUM CARBONATE ANTACID 500 MG PO CHEW
1.0000 | CHEWABLE_TABLET | Freq: Every day | ORAL | Status: DC
  Administered 2024-05-15 – 2024-05-16 (×2): 200 mg via ORAL
  Filled 2024-05-15 (×2): qty 1

## 2024-05-15 MED ORDER — BISACODYL 10 MG RE SUPP
10.0000 mg | Freq: Once | RECTAL | Status: AC
  Administered 2024-05-15: 10 mg via RECTAL
  Filled 2024-05-15: qty 1

## 2024-05-15 MED ORDER — CALCIUM GLUCONATE-NACL 1-0.675 GM/50ML-% IV SOLN
1.0000 g | Freq: Once | INTRAVENOUS | Status: AC
  Administered 2024-05-15: 1000 mg via INTRAVENOUS
  Filled 2024-05-15: qty 50

## 2024-05-15 MED ORDER — MAGNESIUM CITRATE PO SOLN
1.0000 | Freq: Once | ORAL | Status: AC
  Administered 2024-05-15: 1 via ORAL
  Filled 2024-05-15: qty 296

## 2024-05-15 MED ORDER — ESTRADIOL 0.05 MG/24HR TD PTWK
0.0500 mg | MEDICATED_PATCH | TRANSDERMAL | Status: DC
  Administered 2024-05-15 – 2024-05-21 (×3): 0.05 mg via TRANSDERMAL
  Filled 2024-05-15 (×4): qty 1

## 2024-05-15 NOTE — Progress Notes (Addendum)
 PROGRESS NOTE   Karen Lewis  FMW:[id number] DOB: [birth year] DOA: 05/12/2024 PCP: [name], MD   Chief Complaint  Patient presents with   Abdominal Pain   Level of care: Med-Surg  Brief Admission History:  53 year old female with PCOS, morbid obesity status post Roux-en-Y gastric bypass and laparoscopic gastric sleeve, fibromyalgia, chronic pain, opioid dependence, sleep apnea not on CPAP, GAD, degenerative arthritis of knees and shoulders, asthma, type 2 diabetes mellitus, GERD, gonalgia, hypothyroidism, HSV, difficult intubation, cephalgia who reports that she was treated for UTI about 5 days ago with a course of Augmentin .  About 1 week ago she was having symptoms of right lower quadrant abdominal pain and thought it was related to the UTI versus muscle strain.  She started having fever overnight.  Her temperature was reportedly 101.4 at home.  Patient was evaluated in the emergency department today and CT scanning demonstrates acute appendicitis.  She has been evaluated by the surgical team and they are planning to take her to the OR later today.  She is being admitted to the stepdown ICU bed due to history of difficult intubation, multiple medical comorbidities and need for close postoperative monitoring.   Assessment and Plan:  Acute appendicitis with perforated appendix -- postop s/p robotic appendectomy 8/30 with [name] -- working on postop pain management -- stopped IV dilaudid  and manage with oral oxycodone  -- antibiotics for 5 days postop per surgery recommendations -- pt taken off IV pain management and transitioned to oral care regimen  Constipation  -- no BM postop so far -- added suppository to bowel regimen -- rule out ileus - Dr. 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 -- advanced diet after surgery  -- monitoring CBG with SSI coverage for now -- follow up A1c test --  6.6%  CBG (last 3)  Recent Labs    05/15/24 0305 05/15/24 0754 05/15/24 1129  GLUCAP 135* 145* 222*    Diabetic neuropathy -- resumed home pregabalin    Hypomagnesemia -- repleted   Hypocalcemia -- corrected calcium  8.24 mg/dL -- this is chronic and she is on daily tums which has been reordered -- IV calcium  gluconate x 1 dose ordered 9/2 -- recheck in AM    Sleep Apnea -- reportedly not on CPAP -- ordered CPAP autotitrated while inpatient   Chronic Pain Opioid dependence Fibromyalgia  -- pt taken off IV pain management and transitioned to oral care regimen    Essential hypertension  -- resume home meds when reconciled and able to take p.o.  -- IV hydralazine  ordered PRN for elevated BPs   Hyperlipidemia -- resume home rosuvastatin    Leukocytosis -- Improving  -- secondary to acute appendicitis -- WBC trending down after surgery  -- follow up blood culture -- continue IV antibiotics but plan to DC home on augmentin   -- follow blood culture, lactic acid is reassuring at 1.0   Class 3 obesity  -- s/p Roux-en-Y Gastric bypass and gastric sleeve procedure -- has been on semaglutide -- she is working closely with her PCP on weight loss journey -- supportive management for now while inpatient    DVT prophylaxis: enoxaparin   Code Status: Full  Family Communication:  Disposition: anticipate home when cleared by surgery team    Consultants:  Surgery   Procedures:  Robotic appendectomy 05/12/24   Antimicrobials:    Subjective: Pt reports she has had no BM and constipated with  symptoms of abdominal tightness and distension.  Still tolerating diet so far no emesis. Pain seems to be managed with oral pain regimen.      Objective: Vitals:   05/14/24 1356 05/14/24 1957 05/15/24 0502 05/15/24 0801  BP: 118/76 106/61 111/67   Pulse: (!) 101 95 (!) 109   Resp:  20 20   Temp: 98.2 F (36.8 C) 98.6 F (37 C) 99.8 F (37.7 C)   TempSrc: Oral Oral Oral   SpO2: 96%  97% 96% 91%  Weight:      Height:        Intake/Output Summary (Last 24 hours) at 05/15/2024 1222 Last data filed at 05/15/2024 0156 Gross per 24 hour  Intake 559.37 ml  Output --  Net 559.37 ml   Filed Weights   05/12/24 1124 05/12/24 1717  Weight: (!) 152.9 kg (!) 155.4 kg   Examination:  General exam: Appears calm but uncomfortable, does not appear to be in distress.   Respiratory system: Clear to auscultation. Respiratory effort normal. Cardiovascular system: normal S1 & S2 heard. No JVD, murmurs, rubs, gallops or clicks. No pedal edema. Gastrointestinal system: Abdomen is obese, mildly distended, soft and light tenderness, wounds clean and dry. No masses felt. Normal bowel sounds heard. Central nervous system: Alert and oriented. No focal neurological deficits. Extremities: Symmetric 5 x 5 power. Skin: No rashes, lesions or ulcers. Psychiatry: Judgement and insight appear normal. Mood & affect appropriate.   Data Reviewed: I have personally reviewed following labs and imaging studies  CBC: Recent Labs  Lab 05/12/24 1125 05/13/24 0338 05/14/24 0402 05/15/24 0433  WBC 21.8* 16.3* 11.2* 11.0*  NEUTROABS  --  12.8* 9.4*  --   HGB 13.6 11.3* 10.5* 11.4*  HCT 41.3 35.1* 33.7* 36.2  MCV 89.6 91.2 92.8 92.8  PLT 300 225 209 265    Basic Metabolic Panel: Recent Labs  Lab 05/12/24 1125 05/13/24 0338 05/14/24 0402 05/15/24 0433  NA 136 137 134* 135  K 4.0 3.9 4.1 4.5  CL 102 104 102 104  CO2 22 24 23 23   GLUCOSE 150* 146* 244* 160*  BUN 19 19 19 17   CREATININE 1.28* 1.19* 1.28* 1.36*  CALCIUM  8.4* 7.7* 7.2* 7.2*  MG  --  1.8 2.1  --     CBG: Recent Labs  Lab 05/14/24 1619 05/14/24 1959 05/15/24 0305 05/15/24 0754 05/15/24 1129  GLUCAP 152* 201* 135* 145* 222*    Recent Results (from the past 240 hours)  Blood culture (routine x 2)     Status: None (Preliminary result)   Collection Time: 05/12/24 12:57 PM   Specimen: Right Antecubital; Blood  Result  Value Ref Range Status   Specimen Description   Final    RIGHT ANTECUBITAL BOTTLES DRAWN AEROBIC AND ANAEROBIC   Special Requests Blood Culture adequate volume  Final   Culture   Final    NO GROWTH 3 DAYS Performed at [facility], [address] [place], [place] [address]    Report Status PENDING  Incomplete     Radiology Studies: No results found.   Scheduled Meds:  acetaminophen   1,000 mg Oral Q6H   acidophilus  1 capsule Oral Daily   amitriptyline   100 mg Oral QHS   calcium  carbonate  1 tablet Oral Daily   Chlorhexidine  Gluconate Cloth  6 each Topical Daily   DULoxetine   60 mg Oral Daily   enoxaparin  (LOVENOX ) injection  75 mg Subcutaneous Q24H   estradiol   0.05 mg Transdermal Once  per day on Monday Thursday   fluticasone  furoate-vilanterol  1 puff Inhalation Daily   insulin  aspart  0-15 Units Subcutaneous TID WC   levothyroxine   125 mcg Oral q morning   lisinopril   5 mg Oral Daily   loratadine   10 mg Oral QHS   methocarbamol   500 mg Oral TID   metoprolol  tartrate  12.5 mg Oral TID   montelukast   10 mg Oral QHS   nystatin    Topical BID   pantoprazole   40 mg Oral QPM   polyethylene glycol  17 g Oral QPM   pregabalin   225 mg Oral BID   progesterone   100 mg Oral Daily   QUEtiapine   50 mg Oral QHS   rosuvastatin   40 mg Oral Daily   topiramate   100 mg Oral BID   Continuous Infusions:  lactated ringers  70 mL/hr at 05/15/24 0156   piperacillin -tazobactam (ZOSYN )  IV 3.375 g (05/15/24 0551)     LOS: 2 days   Time spent: 55 mins  Shayna Eblen Vicci, MD How to contact the [facility] Attending or Consulting provider 7A - 7P or covering provider during after hours 7P -7A, for this patient?  Check the care team in [facility] and look for a) attending/consulting TR[facility] provider listed and b) the TRH team listed Log into [email or web address] to find provider on call.  Locate the [facility] provider you are looking for under [facility] and page to a number that you can be directly reached. If you  still have difficulty reaching the provider, please page the [facility] (Director on Call) for the Hospitalists listed on amion for assistance.  05/15/2024, 12:22 PM

## 2024-05-15 NOTE — Progress Notes (Signed)
 [facility] Progress Note  3 Days Post-Op  Subjective: Patient seen and examined.  She is sitting comfortably in bed eating a sandwich.  She denies any nausea or vomiting with food, though she has decreased appetite.  She confirms passing flatus, but denies any bowel movements since the surgery.  She states that her abdominal pain is significantly worse today and she feels tense across her abdomen.  Objective: Vital signs in last 24 hours: Temp:  [98.2 F (36.8 C)-99.8 F (37.7 C)] 99.8 F (37.7 C) (09/02 0502) Pulse Rate:  [95-109] 105 (09/02 1328) Resp:  [20] 20 (09/02 0502) BP: (106-118)/(61-96) 117/96 (09/02 1328) SpO2:  [91 %-97 %] 96 % (09/02 1328) Last BM Date : 05/12/24  Intake/Output from previous day: 09/01 0701 - 09/02 0700 In: 559.4 [I.V.:559.4] Out: -  Intake/Output this shift: No intake/output data recorded.  General appearance: alert, cooperative, and no distress GI: Abdomen soft, mild distention, no percussion tenderness, mild diffuse tenderness to palpation; no rigidity, guarding, rebound tenderness; laparoscopic incision sites C/D/I with skin staples in place, umbilical hernia hard without tenderness to palpation  Lab Results:  Recent Labs    05/14/24 0402 05/15/24 0433  WBC 11.2* 11.0*  HGB 10.5* 11.4*  HCT 33.7* 36.2  PLT 209 265   BMET Recent Labs    05/14/24 0402 05/15/24 0433  NA 134* 135  K 4.1 4.5  CL 102 104  CO2 23 23  GLUCOSE 244* 160*  BUN 19 17  CREATININE 1.28* 1.36*  CALCIUM  7.2* 7.2*   PT/INR No results for input(s): LABPROT, INR in the last 72 hours.  Studies/Results: No results found.  Anti-infectives: Anti-infectives (From admission, onward)    Start     Dose/Rate Route Frequency Ordered Stop   05/12/24 2200  piperacillin -tazobactam (ZOSYN ) IVPB 3.375 g        3.375 g 12.5 mL/hr over 240 Minutes Intravenous Every 8 hours 05/12/24 1447     05/12/24 1400  piperacillin -tazobactam (ZOSYN ) IVPB  3.375 g        3.375 g 100 mL/hr over 30 Minutes Intravenous  Once 05/12/24 1346 05/12/24 1453       Assessment/Plan:  Patient is a 53 year old female who was admitted with acute appendicitis.  She is status post robotic assisted laparoscopic appendectomy on 8/31 for acute perforated appendicitis.   -Leukocytosis is stable -Continue IV Zosyn .  Will need a total of 5 days of antibiotic treatment.  Would recommend patient be discharged home with Augmentin  -KUB demonstrating dilated loops of small bowel concerning for ileus -Will decrease diet down to full liquids -Bowel regimen ordered with Senokot-S, MiraLAX , and Dulcolax.  Will also order magnesium  citrate today -Encourage ambulation -Discussed with the patient that she likely has a postoperative ileus from a combination of things, including her perforated appendicitis and her significant narcotic usage.  Advised that she should try to cut back on the amount of narcotics she is getting, as it will assist with having a bowel movement -Monitor for bowel function -Continue scheduled Tylenol , Lyrica , Zanaflex , Topamax , Robaxin , and Toradol .   -Continue as needed oxycodone .  Discussed with Dr. Vicci decreasing dose down to 10 mg -Patient will need to follow-up with her pain clinic specialist this week after discharge regarding further narcotics, as I am limited with how much I can send her home with postoperatively. -Appreciate hospitalist recommendations   LOS: 2 days    Ceana Fiala A Kipper Buch 05/15/2024  Note: Portions of this report may have been transcribed using voice  recognition software. Every effort has been made to ensure accuracy; however, inadvertent computerized transcription errors may still be present.

## 2024-05-15 NOTE — Progress Notes (Signed)
 Patient able to ambulate from her room to 300 hall nurses desk and back , tolerated well, had to stop 1 time to rest, otherwise ambulates very well. She consumed all of magnesium  citrate, will continue to encourage ambulation .

## 2024-05-15 NOTE — Progress Notes (Signed)
   05/15/24 1515  Assess: MEWS Score  Temp 98.8 F (37.1 C)  BP 109/64  MAP (mmHg) 76  Pulse Rate (!) 119  Level of Consciousness Alert  SpO2 96 %  O2 Device Room Air  Assess: MEWS Score  MEWS Temp 0  MEWS Systolic 0  MEWS Pulse 2  MEWS RR 0  MEWS LOC 0  MEWS Score 2  MEWS Score Color Yellow  Assess: if the MEWS score is Yellow or Red  Were vital signs accurate and taken at a resting state? Yes  Does the patient meet 2 or more of the SIRS criteria? No  MEWS guidelines implemented  Yes, yellow  Treat  MEWS Interventions Considered administering scheduled or prn medications/treatments as ordered  Take Vital Signs  Increase Vital Sign Frequency  Yellow: Q2hr x1, continue Q4hrs until patient remains green for 12hrs  Escalate  MEWS: Escalate Yellow: Discuss with charge nurse and consider notifying provider and/or RRT  Notify: Charge Nurse/RN  Name of Charge Nurse/RN Notified mary ann  Provider Notification  Provider Name/Title Vicci  Date Provider Notified 05/15/24  Time Provider Notified 1524  Method of Notification Page  Notification Reason Change in status  Assess: SIRS CRITERIA  SIRS Temperature  0  SIRS Respirations  0  SIRS Pulse 1  SIRS WBC 0  SIRS Score Sum  1

## 2024-05-15 NOTE — Plan of Care (Signed)

## 2024-05-16 ENCOUNTER — Inpatient Hospital Stay (HOSPITAL_COMMUNITY)

## 2024-05-16 ENCOUNTER — Inpatient Hospital Stay (HOSPITAL_COMMUNITY): Admitting: Anesthesiology

## 2024-05-16 ENCOUNTER — Encounter (HOSPITAL_COMMUNITY)
Admission: EM | Disposition: A | Discharge status: Home Health | Payer: Self-pay | Source: Home / Self Care | Attending: Internal Medicine

## 2024-05-16 ENCOUNTER — Encounter (HOSPITAL_COMMUNITY): Payer: Self-pay | Admitting: Family Medicine

## 2024-05-16 DIAGNOSIS — I10 Essential (primary) hypertension: Secondary | ICD-10-CM | POA: Diagnosis not present

## 2024-05-16 DIAGNOSIS — E119 Type 2 diabetes mellitus without complications: Secondary | ICD-10-CM

## 2024-05-16 DIAGNOSIS — K436 Other and unspecified ventral hernia with obstruction, without gangrene: Secondary | ICD-10-CM

## 2024-05-16 DIAGNOSIS — K56609 Unspecified intestinal obstruction, unspecified as to partial versus complete obstruction: Secondary | ICD-10-CM | POA: Diagnosis not present

## 2024-05-16 DIAGNOSIS — E66813 Obesity, class 3: Secondary | ICD-10-CM

## 2024-05-16 DIAGNOSIS — N1831 Chronic kidney disease, stage 3a: Secondary | ICD-10-CM | POA: Diagnosis not present

## 2024-05-16 DIAGNOSIS — J45909 Unspecified asthma, uncomplicated: Secondary | ICD-10-CM | POA: Diagnosis not present

## 2024-05-16 DIAGNOSIS — K3589 Other acute appendicitis without perforation or gangrene: Secondary | ICD-10-CM | POA: Diagnosis not present

## 2024-05-16 HISTORY — PX: VENTRAL HERNIA REPAIR: SHX424

## 2024-05-16 LAB — RENAL FUNCTION PANEL
Albumin: 3 g/dL — ABNORMAL LOW (ref 3.5–5.0)
Anion gap: 9 (ref 5–15)
BUN: 16 mg/dL (ref 6–20)
CO2: 26 mmol/L (ref 22–32)
Calcium: 7.8 mg/dL — ABNORMAL LOW (ref 8.9–10.3)
Chloride: 101 mmol/L (ref 98–111)
Creatinine, Ser: 1.22 mg/dL — ABNORMAL HIGH (ref 0.44–1.00)
GFR, Estimated: 53 mL/min — ABNORMAL LOW (ref >60)
Glucose, Bld: 140 mg/dL — ABNORMAL HIGH (ref 70–99)
Phosphorus: <1 mg/dL — CL (ref 2.5–4.6)
Potassium: 4.7 mmol/L (ref 3.5–5.1)
Sodium: 136 mmol/L (ref 135–145)

## 2024-05-16 LAB — CBC WITH DIFFERENTIAL/PLATELET
Abs Immature Granulocytes: 0.04 K/uL (ref 0.00–0.07)
Basophils Absolute: 0 K/uL (ref 0.0–0.1)
Basophils Relative: 0 %
Eosinophils Absolute: 0.2 K/uL (ref 0.0–0.5)
Eosinophils Relative: 2 %
HCT: 36.9 % (ref 36.0–46.0)
Hemoglobin: 11.6 g/dL — ABNORMAL LOW (ref 12.0–15.0)
Immature Granulocytes: 0 %
Lymphocytes Relative: 12 %
Lymphs Abs: 1.2 K/uL (ref 0.7–4.0)
MCH: 29 pg (ref 26.0–34.0)
MCHC: 31.4 g/dL (ref 30.0–36.0)
MCV: 92.3 fL (ref 80.0–100.0)
Monocytes Absolute: 0.7 K/uL (ref 0.1–1.0)
Monocytes Relative: 7 %
Neutro Abs: 7.8 K/uL — ABNORMAL HIGH (ref 1.7–7.7)
Neutrophils Relative %: 79 %
Platelets: 299 K/uL (ref 150–400)
RBC: 4 MIL/uL (ref 3.87–5.11)
RDW: 13.2 % (ref 11.5–15.5)
WBC: 10 K/uL (ref 4.0–10.5)
nRBC: 0 % (ref 0.0–0.2)

## 2024-05-16 LAB — GLUCOSE, CAPILLARY
Glucose-Capillary: 129 mg/dL — ABNORMAL HIGH (ref 70–99)
Glucose-Capillary: 146 mg/dL — ABNORMAL HIGH (ref 70–99)
Glucose-Capillary: 124 mg/dL — ABNORMAL HIGH (ref 70–99)
Glucose-Capillary: 166 mg/dL — ABNORMAL HIGH (ref 70–99)
Glucose-Capillary: 179 mg/dL — ABNORMAL HIGH (ref 70–99)
Glucose-Capillary: 159 mg/dL — ABNORMAL HIGH (ref 70–99)
Glucose-Capillary: 156 mg/dL — ABNORMAL HIGH (ref 70–99)

## 2024-05-16 LAB — MAGNESIUM: Magnesium: 3 mg/dL — ABNORMAL HIGH (ref 1.7–2.4)

## 2024-05-16 LAB — MRSA NEXT GEN BY PCR, NASAL: MRSA by PCR Next Gen: NOT DETECTED

## 2024-05-16 SURGERY — REPAIR, HERNIA, VENTRAL
Anesthesia: General | Site: Abdomen

## 2024-05-16 MED ORDER — POLYETHYLENE GLYCOL 3350 17 G PO PACK
17.0000 g | PACK | Freq: Every day | ORAL | Status: DC
  Administered 2024-05-16: 17 g via ORAL
  Filled 2024-05-16: qty 1

## 2024-05-16 MED ORDER — MAGNESIUM CITRATE PO SOLN
1.0000 | Freq: Once | ORAL | Status: AC
  Administered 2024-05-16: 1 via ORAL
  Filled 2024-05-16: qty 296

## 2024-05-16 MED ORDER — PANTOPRAZOLE SODIUM 40 MG IV SOLR
40.0000 mg | INTRAVENOUS | Status: DC
  Administered 2024-05-16: 40 mg via INTRAVENOUS
  Filled 2024-05-16: qty 10

## 2024-05-16 MED ORDER — 0.9 % SODIUM CHLORIDE (POUR BTL) OPTIME
TOPICAL | Status: DC | PRN
  Administered 2024-05-16: 1000 mL

## 2024-05-16 MED ORDER — SCOPOLAMINE 1 MG/3DAYS TD PT72
1.0000 | MEDICATED_PATCH | Freq: Once | TRANSDERMAL | Status: DC
  Administered 2024-05-16: 1 mg via TRANSDERMAL
  Filled 2024-05-16: qty 1

## 2024-05-16 MED ORDER — SUGAMMADEX SODIUM 200 MG/2ML IV SOLN
INTRAVENOUS | Status: DC | PRN
  Administered 2024-05-16: 200 mg via INTRAVENOUS

## 2024-05-16 MED ORDER — DEXMEDETOMIDINE HCL IN NACL 80 MCG/20ML IV SOLN
INTRAVENOUS | Status: DC | PRN
  Administered 2024-05-16: 20 ug via INTRAVENOUS

## 2024-05-16 MED ORDER — CHLORHEXIDINE GLUCONATE 0.12 % MT SOLN
15.0000 mL | Freq: Once | OROMUCOSAL | Status: AC
  Administered 2024-05-16: 15 mL via OROMUCOSAL
  Filled 2024-05-16: qty 15

## 2024-05-16 MED ORDER — LACTATED RINGERS IV SOLN: INTRAVENOUS | Status: DC

## 2024-05-16 MED ORDER — ORAL CARE MOUTH RINSE: 15.0000 mL | Freq: Once | OROMUCOSAL | Status: AC

## 2024-05-16 MED ORDER — MIDAZOLAM HCL 2 MG/2ML IJ SOLN
INTRAMUSCULAR | Status: AC
  Filled 2024-05-16: qty 2

## 2024-05-16 MED ORDER — HYDRALAZINE HCL 20 MG/ML IJ SOLN: 10.0000 mg | Freq: Four times a day (QID) | INTRAMUSCULAR | Status: DC | PRN

## 2024-05-16 MED ORDER — ACETAMINOPHEN 500 MG PO TABS
1000.0000 mg | ORAL_TABLET | Freq: Four times a day (QID) | ORAL | Status: DC
  Administered 2024-05-17 – 2024-05-18 (×4): 1000 mg
  Filled 2024-05-16 (×5): qty 2

## 2024-05-16 MED ORDER — QUETIAPINE FUMARATE 25 MG PO TABS
50.0000 mg | ORAL_TABLET | Freq: Every day | ORAL | Status: DC
  Administered 2024-05-17: 50 mg
  Filled 2024-05-16: qty 2

## 2024-05-16 MED ORDER — LIDOCAINE HCL (CARDIAC) PF 100 MG/5ML IV SOSY
PREFILLED_SYRINGE | INTRAVENOUS | Status: DC | PRN
  Administered 2024-05-16: 100 mg via INTRATRACHEAL

## 2024-05-16 MED ORDER — SUCCINYLCHOLINE CHLORIDE 200 MG/10ML IV SOSY
PREFILLED_SYRINGE | INTRAVENOUS | Status: AC
  Filled 2024-05-16: qty 10

## 2024-05-16 MED ORDER — DEXMEDETOMIDINE HCL IN NACL 80 MCG/20ML IV SOLN
INTRAVENOUS | Status: AC
  Filled 2024-05-16: qty 20

## 2024-05-16 MED ORDER — TOPIRAMATE 100 MG PO TABS
100.0000 mg | ORAL_TABLET | Freq: Two times a day (BID) | ORAL | Status: DC
  Administered 2024-05-17 – 2024-05-18 (×3): 100 mg
  Filled 2024-05-16 (×3): qty 1

## 2024-05-16 MED ORDER — MIDAZOLAM HCL 2 MG/2ML IJ SOLN: 2.0000 mg | Freq: Once | INTRAMUSCULAR | Status: AC

## 2024-05-16 MED ORDER — INSULIN ASPART 100 UNIT/ML IJ SOLN: 0.0000 [IU] | INTRAMUSCULAR | Status: DC

## 2024-05-16 MED ORDER — SUGAMMADEX SODIUM 200 MG/2ML IV SOLN
INTRAVENOUS | Status: AC
  Filled 2024-05-16: qty 2

## 2024-05-16 MED ORDER — PIPERACILLIN-TAZOBACTAM 3.375 G IVPB
3.3750 g | Freq: Three times a day (TID) | INTRAVENOUS | Status: AC
  Administered 2024-05-16 (×3): 3.375 g via INTRAVENOUS
  Filled 2024-05-16 (×2): qty 50

## 2024-05-16 MED ORDER — HYDROMORPHONE HCL 1 MG/ML IJ SOLN
1.0000 mg | INTRAMUSCULAR | Status: DC | PRN
  Administered 2024-05-16 – 2024-05-18 (×6): 1 mg via INTRAVENOUS
  Filled 2024-05-16 (×6): qty 1

## 2024-05-16 MED ORDER — ROCURONIUM BROMIDE 10 MG/ML (PF) SYRINGE
PREFILLED_SYRINGE | INTRAVENOUS | Status: AC
  Filled 2024-05-16: qty 10

## 2024-05-16 MED ORDER — PROPOFOL 10 MG/ML IV BOLUS
INTRAVENOUS | Status: DC | PRN
  Administered 2024-05-16: 200 mg via INTRAVENOUS

## 2024-05-16 MED ORDER — AMITRIPTYLINE HCL 25 MG PO TABS
100.0000 mg | ORAL_TABLET | Freq: Every day | ORAL | Status: DC
  Administered 2024-05-17: 100 mg
  Filled 2024-05-16: qty 4

## 2024-05-16 MED ORDER — SODIUM PHOSPHATES 45 MMOLE/15ML IV SOLN
30.0000 mmol | Freq: Once | INTRAVENOUS | Status: AC
  Administered 2024-05-16: 30 mmol via INTRAVENOUS
  Filled 2024-05-16: qty 10

## 2024-05-16 MED ORDER — MIDAZOLAM HCL 2 MG/2ML IJ SOLN
INTRAMUSCULAR | Status: DC | PRN
  Administered 2024-05-16: 2 mg via INTRAVENOUS

## 2024-05-16 MED ORDER — LACTATED RINGERS IV SOLN: INTRAVENOUS | Status: AC

## 2024-05-16 MED ORDER — ROCURONIUM BROMIDE 10 MG/ML (PF) SYRINGE
PREFILLED_SYRINGE | INTRAVENOUS | Status: DC | PRN
  Administered 2024-05-16: 70 mg via INTRAVENOUS

## 2024-05-16 MED ORDER — METOPROLOL TARTRATE 5 MG/5ML IV SOLN
2.5000 mg | Freq: Four times a day (QID) | INTRAVENOUS | Status: AC
  Administered 2024-05-17 – 2024-05-19 (×11): 2.5 mg via INTRAVENOUS
  Filled 2024-05-16 (×12): qty 5

## 2024-05-16 MED ORDER — SUCCINYLCHOLINE CHLORIDE 200 MG/10ML IV SOSY
PREFILLED_SYRINGE | INTRAVENOUS | Status: DC | PRN
  Administered 2024-05-16: 180 mg via INTRAVENOUS

## 2024-05-16 MED ORDER — BUPIVACAINE HCL (PF) 0.5 % IJ SOLN
INTRAMUSCULAR | Status: DC | PRN
  Administered 2024-05-16: 30 mL

## 2024-05-16 MED ORDER — ENOXAPARIN SODIUM 80 MG/0.8ML IJ SOSY
80.0000 mg | PREFILLED_SYRINGE | INTRAMUSCULAR | Status: DC
  Administered 2024-05-17 – 2024-05-21 (×5): 80 mg via SUBCUTANEOUS
  Filled 2024-05-16 (×5): qty 0.8

## 2024-05-16 MED ORDER — FENTANYL CITRATE (PF) 100 MCG/2ML IJ SOLN
INTRAMUSCULAR | Status: DC | PRN
  Administered 2024-05-16: 100 ug via INTRAVENOUS

## 2024-05-16 MED ORDER — BUPIVACAINE HCL (PF) 0.5 % IJ SOLN
INTRAMUSCULAR | Status: AC
  Filled 2024-05-16: qty 30

## 2024-05-16 MED ORDER — LIDOCAINE 2% (20 MG/ML) 5 ML SYRINGE
INTRAMUSCULAR | Status: AC
Start: 2024-05-16 — End: 2024-05-16
  Filled 2024-05-16: qty 5

## 2024-05-16 MED ORDER — LEVOTHYROXINE SODIUM 125 MCG PO TABS
125.0000 ug | ORAL_TABLET | Freq: Every morning | ORAL | Status: DC
  Administered 2024-05-17 – 2024-05-18 (×2): 125 ug
  Filled 2024-05-16 (×2): qty 1

## 2024-05-16 MED ORDER — MIDAZOLAM HCL 2 MG/2ML IJ SOLN
INTRAMUSCULAR | Status: AC
  Administered 2024-05-16: 2 mg via INTRAVENOUS
  Filled 2024-05-16: qty 2

## 2024-05-16 MED ORDER — IOHEXOL 300 MG/ML  SOLN
100.0000 mL | Freq: Once | INTRAMUSCULAR | Status: AC | PRN
  Administered 2024-05-16: 100 mL via INTRAVENOUS

## 2024-05-16 MED ORDER — OXYCODONE HCL 5 MG PO TABS
10.0000 mg | ORAL_TABLET | ORAL | Status: DC | PRN
  Administered 2024-05-17 – 2024-05-18 (×3): 10 mg
  Filled 2024-05-16 (×3): qty 2

## 2024-05-16 MED ORDER — MONTELUKAST SODIUM 10 MG PO TABS
10.0000 mg | ORAL_TABLET | Freq: Every day | ORAL | Status: DC
  Administered 2024-05-16 – 2024-05-17 (×2): 10 mg
  Filled 2024-05-16: qty 1

## 2024-05-16 MED ORDER — FENTANYL CITRATE (PF) 100 MCG/2ML IJ SOLN
INTRAMUSCULAR | Status: AC
  Filled 2024-05-16: qty 2

## 2024-05-16 MED ORDER — ONDANSETRON HCL 4 MG/2ML IJ SOLN
INTRAMUSCULAR | Status: AC
  Filled 2024-05-16: qty 2

## 2024-05-16 SURGICAL SUPPLY — 26 items
BLADE SURG 15 STRL LF DISP TIS (BLADE) IMPLANT
CLOTH BEACON ORANGE TIMEOUT ST (SAFETY) ×1 IMPLANT
COVER LIGHT HANDLE (MISCELLANEOUS) IMPLANT
DRSG OPSITE POSTOP 4X8 (GAUZE/BANDAGES/DRESSINGS) IMPLANT
ELECTRODE REM PT RTRN 9FT ADLT (ELECTROSURGICAL) ×1 IMPLANT
GAUZE 4X4 16PLY ~~LOC~~+RFID DBL (SPONGE) IMPLANT
GAUZE SPONGE 4X4 12PLY STRL (GAUZE/BANDAGES/DRESSINGS) ×1 IMPLANT
GLOVE BIOGEL PI IND STRL 6.5 (GLOVE) ×1 IMPLANT
GLOVE BIOGEL PI IND STRL 7.0 (GLOVE) ×2 IMPLANT
GLOVE SURG SS PI 6.5 STRL IVOR (GLOVE) ×1 IMPLANT
GOWN STRL REUS W/TWL LRG LVL3 (GOWN DISPOSABLE) ×2 IMPLANT
INST SET MAJOR GENERAL (KITS) ×1 IMPLANT
KIT TURNOVER KIT A (KITS) ×1 IMPLANT
MANIFOLD NEPTUNE II (INSTRUMENTS) ×1 IMPLANT
NDL HYPO 21X1.5 SAFETY (NEEDLE) ×1 IMPLANT
NEEDLE HYPO 21X1.5 SAFETY (NEEDLE) ×1 IMPLANT
NS IRRIG 1000ML POUR BTL (IV SOLUTION) ×1 IMPLANT
PACK MAJOR ABDOMINAL (CUSTOM PROCEDURE TRAY) ×1 IMPLANT
PAD ARMBOARD POSITIONER FOAM (MISCELLANEOUS) ×1 IMPLANT
SET BASIN LINEN APH (SET/KITS/TRAYS/PACK) ×1 IMPLANT
SPONGE T-LAP 18X18 ~~LOC~~+RFID (SPONGE) IMPLANT
STAPLER VISISTAT (STAPLE) IMPLANT
SUT ETHIBOND NAB MO 7 #0 18IN (SUTURE) IMPLANT
SUT VIC AB 2-0 CT2 27 (SUTURE) IMPLANT
SYR 30ML LL (SYRINGE) ×2 IMPLANT
TRAY FOLEY MTR SLVR 16FR STAT (SET/KITS/TRAYS/PACK) IMPLANT

## 2024-05-16 NOTE — Plan of Care (Signed)

## 2024-05-16 NOTE — Progress Notes (Addendum)
   05/16/24 9350  Provider Notification  Provider Name/Title Dr. Charlton  Date Provider Notified 05/16/24  Time Provider Notified [phone]  Notification Reason Critical Result (Phosphorus less than 1.)  Provider response Other (Comment) (awaiting orders and MD aware)  Date of Provider Response 05/16/24  Time of Provider Response 0657   MD added Phosphorus orders

## 2024-05-16 NOTE — Anesthesia Postprocedure Evaluation (Signed)
 Anesthesia Post Note  Patient: Karen Lewis  Procedure(s) Performed: REPAIR, HERNIA, VENTRAL (Abdomen)  Patient location during evaluation: ICU Anesthesia Type: General Level of consciousness: awake and alert Pain management: pain level controlled Vital Signs Assessment: post-procedure vital signs reviewed and stable Respiratory status: spontaneous breathing, respiratory function stable, nonlabored ventilation and patient connected to face mask oxygen Cardiovascular status: stable Anesthetic complications: no   There were no known notable events for this encounter.   Last Vitals:  Vitals:   05/16/24 1531 05/16/24 1743  BP: 127/69 132/70  Pulse: (!) 103 (!) 104  Resp: 20   Temp:  37 C  SpO2: 93% 100%    Last Pain:  Vitals:   05/16/24 1531  TempSrc:   PainSc: Asleep                 Bonetta Mostek L Kyley Laurel

## 2024-05-16 NOTE — Op Note (Addendum)
 [facility] Operative Note  05/16/24  Preoperative Diagnosis: Incarcerated ventral hernia, small bowel obstruction   Postoperative Diagnosis: Incarcerated ventral hernia, 5 cm; small bowel obstruction   Procedure(s) Performed: Open primary ventral hernia repair   Surgeon: Dorothyann Brittle, DO    Assistants: No qualified resident was available    Anesthesia: General endotracheal   Anesthesiologist: Landry Dunnings, MD    Specimens: None   Estimated Blood Loss: 20 cc   Blood Replacement: None    Complications: None   Wound Class: Clean   Operative Indications: Patient is a 53 year old female who requires operative intervention for an incarcerated ventral hernia causing a small bowel obstruction.  She has status post robotic assisted laparoscopic appendectomy on 8/30.  During that surgery, she had omentum within an umbilical hernia which was reduced.  Patient expressed that she did not want her umbilical hernia repaired at the time of the surgery.  Postoperatively, she was initially doing well, but over the last 24 to 48 hours she began having increased abdominal pain with dilated loops of small bowel.  When she had an episode of emesis today, she underwent a CT of the abdomen and pelvis which demonstrated concern for small bowel obstruction secondary to her ventral hernia.  Decision was made to take the patient to the operating room for reduction and repair of her hernia.  All risks and benefits of performing this procedure were discussed with the patient including pain, infection, bleeding, damage to the surrounding structures, need for more procedures or surgery, and recurrent ventral hernia. The patient voiced understanding of the procedure, all questions were sought and answered, and consent was obtained.  Findings: -3 cm ventral hernia containing small bowel; hernia defect was extended to 5 cm to allow for reduction and further evaluation of small  bowel -Small bowel appeared viable after reduction -Primary repair of ventral hernia defect -Hemostasis noted at the completion of the case   Procedure: The patient was taken to the operating room and placed supine. General endotracheal anesthesia was induced.  Patient has been receiving IV antibiotics on the floor.  NG tube was already in place preoperatively.  The abdomen was prepared and draped in the usual sterile fashion.  A time-out was completed verifying correct patient, procedure, site, positioning, and implant(s) and/or special equipment prior to beginning this procedure.  A vertical incision was made midline above the umbilicus overlying the palpable ventral hernia. The subcutaneous tissue was dissected down to the fascia.  The hernia sac was identified and carefully entered to prevent injury to small bowel.  Upon entering the hernia sac, a loop of small bowel was noted to be hyperemic.  The hernia defect was extended both superiorly and inferiorly to allow for reduction of the small bowel.  The small bowel was then able to be reduced without difficulty.  The hernia sac was removed from the subcutaneous tissues with electrocautery down to the level of the fascia.  Prior to closing fascia, the small bowel was again evaluated, and appeared pink and viable without any evidence of ischemia.  The fascia was then reapproximated using 0 Ethibond in an interrupted, figure of eight fashion.  Marcaine  was instilled in the incision site.  Hemostasis was obtained.  The umbilicus was tacked to the subcutaneous tissues and fascia with 2-0 Vicryl.  The skin was closed with a skin stapler.  Honeycomb dressing was applied.  Final inspection revealed acceptable hemostasis. All counts were correct at the end of the case. The patient was  awakened from anesthesia and extubated without complication.  The patient was transferred directly to the ICU for close clinical monitoring of her respiratory status  postoperatively.   Dorothyann Brittle, DO  [facility] [address] [place], [place] [address] [phone] (office)

## 2024-05-16 NOTE — Anesthesia Preprocedure Evaluation (Addendum)
 Anesthesia Evaluation  Patient identified by MRN, date of birth, ID band Patient awake    Reviewed: Allergy & Precautions, H&P , NPO status , Patient's Chart, lab work & pertinent test results, reviewed documented beta blocker date and time   History of Anesthesia Complications (+) DIFFICULT AIRWAY and history of anesthetic complications  Airway Mallampati: III  TM Distance: >3 FB Neck ROM: full    Dental no notable dental hx. (+) Dental Advisory Given, Teeth Intact   Pulmonary neg pulmonary ROS, asthma , sleep apnea    Pulmonary exam normal breath sounds clear to auscultation       Cardiovascular Exercise Tolerance: Good hypertension, + angina  + dysrhythmias  Rhythm:regular Rate:Normal     Neuro/Psych  Headaches PSYCHIATRIC DISORDERS Anxiety Depression     Neuromuscular disease negative neurological ROS  negative psych ROS   GI/Hepatic Neg liver ROS, hiatal hernia,GERD  ,,  Endo/Other  diabetesHypothyroidism  Class 4 obesity  Renal/GU Renal InsufficiencyRenal disease  negative genitourinary   Musculoskeletal  (+) Arthritis ,  Fibromyalgia -  Abdominal  (+) + obese  Peds  Hematology negative hematology ROS (+) Blood dyscrasia, anemia   Anesthesia Other Findings   Reproductive/Obstetrics negative OB ROS                              Anesthesia Physical Anesthesia Plan  ASA: 4 and emergent  Anesthesia Plan: General and General ETT   Post-op Pain Management: Dilaudid  IV   Induction: Intravenous, Cricoid pressure planned and Rapid sequence  PONV Risk Score and Plan: Ondansetron , Dexamethasone , Midazolam  and Scopolamine  patch - Pre-op  Airway Management Planned: Oral ETT and Video Laryngoscope Planned  Additional Equipment: None  Intra-op Plan:   Post-operative Plan: Possible Post-op intubation/ventilation  Informed Consent: I have reviewed the patients History and Physical,  chart, labs and discussed the procedure including the risks, benefits and alternatives for the proposed anesthesia with the patient or authorized representative who has indicated his/her understanding and acceptance.     Dental Advisory Given  Plan Discussed with: CRNA  Anesthesia Plan Comments:          Anesthesia Quick Evaluation

## 2024-05-16 NOTE — Progress Notes (Signed)
 PROGRESS NOTE  Karen Lewis FMW:[id number] DOB: 17-Sep-1970 DOA: 05/12/2024 PCP: [name], MD  Brief History:  53 year old female with PCOS, morbid obesity status post Roux-en-Y gastric bypass and laparoscopic gastric sleeve, fibromyalgia, chronic pain, opioid dependence, sleep apnea not on CPAP, GAD, degenerative arthritis of knees and shoulders, asthma, type 2 diabetes mellitus, GERD, gonalgia, hypothyroidism, HSV, difficult intubation, cephalgia who reports that she was treated for UTI about 5 days ago with a course of Augmentin .  About 1 week ago she was having symptoms of right lower quadrant abdominal pain and thought it was related to the UTI versus muscle strain.  She started having fever overnight.  Her temperature was reportedly 101.4 at home.  Patient was evaluated in the emergency department 8/30 and CT scanning demonstrates acute appendicitis.  She has been evaluated by the surgical team and they are planning to take her to the OR later today.  She is being admitted to the stepdown ICU bed due to history of difficult intubation, multiple medical comorbidities and need for close postoperative monitoring.   She was continued on zosyn  postop.  Her post op course was complicated by post op ileus.  Unfortunately, she developed nausea and vomiting on 05/16/2024.  Repeat CT of the abdomen and pelvis suggested bowel obstruction and her umbilical hernia.  The patient will be taken back to the OR by [name]   Assessment/Plan: Acute appendicitis -05/12/24--robotic appendectomy -continue IV zosyn  -initially on IV dilaudid  post-op  Post op Ileus>>Bowel obstruction -more n/v on 05/16/24 -9/3 CT AP with loop of bowel in umbilical hernia -appreciate [name] -planning for OR 9/3 -restart IVF -optimize electrolytes  CKD 3A -baseline creatinine 1.1-1.3 -monitor BMP  Diabetes Mellitus with hyperglycemia -8/30 A1C--6.6 -novolog  sliding scale -no longer on ozempic due  her concerns for side effects  Opioid Dependence/Chronic pain syndrome -PDMP reviewed -oxycodone  5 mg, #90, last fill 03/15/24 -pregabalin  225mg , #180, last fill 04/24/24  Asthma, mod persistent, hx of eosinophilia -failed allergy shots -follow pulmonary  -continue singulair , Breo, albuterol  prn -she is on dupixent  Morbid obesity, s/p gastric bypass surgery (sleeve). S/p redo  ( S/P Lap RYGBP from sleeve gastrectomy)  -BMI 60.69 -lifestyle modifications  Anxiety/Depression -continue clonazepam  -PDMP shows last fill 0.5 mg, #15, 04/17/24 -continue quetiapine   History of  Mycobacterium abscessus infection  --soft tissue of the abdominal wall following lipectomy  -follow up [name]  Hypothyroidism -continue synthroid  -check TSH  GERD -continue PPI  Mixed HLD -continue statin once able to tolerate po  Chronic lower extremity swelling/lymphedema. -Moved from [place]  years ago.  Previously worked as a Engineer, civil (consulting).  Hypomagnesemia -repleted    Family Communication: no  Family at bedside  Consultants:    Code Status:  FULL / DNR  DVT Prophylaxis:  [place] Lovenox    Procedures: As Listed in Progress Note Above  Antibiotics: Zosyn  8/30>>    Subjective: Pt complains of n/v today.  Denies f/c, cp, sob.  Passed mucus per rectum yesterday.    Objective: Vitals:   05/16/24 0008 05/16/24 0425 05/16/24 0730 05/16/24 0738  BP: 112/70 (!) 103/59 129/82   Pulse: 92 96 95   Resp: 20 18 20    Temp: 98.8 F (37.1 C) 98.6 F (37 C)    TempSrc: Oral Oral    SpO2: 98% 96% 95% 95%  Weight:      Height:        Intake/Output Summary (Last 24 hours) at  05/16/2024 1416 Last data filed at 05/16/2024 0501 Gross per 24 hour  Intake 441.91 ml  Output 0 ml  Net 441.91 ml   Weight change:  Exam:  General:  Pt is alert, follows commands appropriately, not in acute distress HEENT: No icterus, No thrush, No neck mass, [place]/AT Cardiovascular: RRR, S1/S2, no rubs, no  gallops Respiratory: fine bibasilar rales.  No wheeze Abdomen: Soft/+BS, diffuse tender, non distended, no guarding Extremities: 1 + LE edema, No lymphangitis, No petechiae, No rashes, no synovitis   Data Reviewed: I have personally reviewed following labs and imaging studies Basic Metabolic Panel: Recent Labs  Lab 05/12/24 1125 05/13/24 0338 05/14/24 0402 05/15/24 0433 05/16/24 0429  NA 136 137 134* 135 136  K 4.0 3.9 4.1 4.5 4.7  CL 102 104 102 104 101  CO2 22 24 23 23 26   GLUCOSE 150* 146* 244* 160* 140*  BUN 19 19 19 17 16   CREATININE 1.28* 1.19* 1.28* 1.36* 1.22*  CALCIUM  8.4* 7.7* 7.2* 7.2* 7.8*  MG  --  1.8 2.1  --  3.0*  PHOS  --   --   --   --  <1.0*   Liver Function Tests: Recent Labs  Lab 05/12/24 1125 05/13/24 0338 05/15/24 0433 05/16/24 0429  AST 26 17  --   --   ALT 39 26  --   --   ALKPHOS 79 64  --   --   BILITOT 0.7 0.6  --   --   PROT 7.4 6.1*  --   --   ALBUMIN 3.7 2.9* 2.7* 3.0*   Recent Labs  Lab 05/12/24 1125  LIPASE 40   No results for input(s): AMMONIA in the last 168 hours. Coagulation Profile: No results for input(s): INR, PROTIME in the last 168 hours. CBC: Recent Labs  Lab 05/12/24 1125 05/13/24 0338 05/14/24 0402 05/15/24 0433 05/16/24 0429  WBC 21.8* 16.3* 11.2* 11.0* 10.0  NEUTROABS  --  12.8* 9.4*  --  7.8*  HGB 13.6 11.3* 10.5* 11.4* 11.6*  HCT 41.3 35.1* 33.7* 36.2 36.9  MCV 89.6 91.2 92.8 92.8 92.3  PLT 300 225 209 265 299   Cardiac Enzymes: No results for input(s): CKTOTAL, CKMB, CKMBINDEX, TROPONINI in the last 168 hours. BNP: Invalid input(s): POCBNP CBG: Recent Labs  Lab 05/15/24 2212 05/16/24 0332 05/16/24 0731 05/16/24 1109 05/16/24 1230  GLUCAP 150* 129* 146* 124* 166*   HbA1C: No results for input(s): HGBA1C in the last 72 hours. Urine analysis:    Component Value Date/Time   COLORURINE YELLOW 05/12/2024 1151   APPEARANCEUR CLEAR 05/12/2024 1151   APPEARANCEUR Hazy (A)  03/14/2023 1309   LABSPEC 1.012 05/12/2024 1151   LABSPEC 1.003 03/25/2013 2018   PHURINE 7.0 05/12/2024 1151   GLUCOSEU NEGATIVE 05/12/2024 1151   GLUCOSEU Negative 03/25/2013 2018   HGBUR NEGATIVE 05/12/2024 1151   BILIRUBINUR NEGATIVE 05/12/2024 1151   BILIRUBINUR Negative 03/14/2023 1309   BILIRUBINUR Negative 03/25/2013 2018   KETONESUR NEGATIVE 05/12/2024 1151   PROTEINUR NEGATIVE 05/12/2024 1151   UROBILINOGEN 0.2 08/04/2020 1345   UROBILINOGEN 0.2 05/09/2015 0115   NITRITE NEGATIVE 05/12/2024 1151   LEUKOCYTESUR NEGATIVE 05/12/2024 1151   LEUKOCYTESUR 2+ 03/25/2013 2018   Sepsis Labs: @LABRCNTIP (procalcitonin:4,lacticidven:4) ) Recent Results (from the past 240 hours)  Blood culture (routine x 2)     Status: None (Preliminary result)   Collection Time: 05/12/24 12:57 PM   Specimen: Right Antecubital; Blood  Result Value Ref Range Status   Specimen Description  Final    RIGHT ANTECUBITAL BOTTLES DRAWN AEROBIC AND ANAEROBIC   Special Requests Blood Culture adequate volume  Final   Culture   Final    NO GROWTH 3 DAYS Performed at [facility], [address], [place], [place] [address]    Report Status PENDING  Incomplete  MRSA Next Gen by PCR, Nasal     Status: None   Collection Time: 05/16/24 10:00 AM   Specimen: Nasal Mucosa; Nasal Swab  Result Value Ref Range Status   MRSA by PCR Next Gen NOT DETECTED NOT DETECTED Final    Comment: (NOTE) The GeneXpert MRSA Assay (FDA approved for NASAL specimens only), is one component of a comprehensive MRSA colonization surveillance program. It is not intended to diagnose MRSA infection nor to guide or monitor treatment for MRSA infections. Test performance is not FDA approved in patients less than 31 years old. Performed at [facility], [address] [place], [place] [address]      Scheduled Meds:  acetaminophen   1,000 mg Oral Q6H   acidophilus  1 capsule Oral Daily   amitriptyline   100 mg Oral QHS   bisacodyl   10  mg Rectal Daily   calcium  carbonate  1 tablet Oral Daily   Chlorhexidine  Gluconate Cloth  6 each Topical Daily   DULoxetine   60 mg Oral Daily   [START ON 05/17/2024] enoxaparin  (LOVENOX ) injection  80 mg Subcutaneous Q24H   estradiol   0.05 mg Transdermal Once per day on Monday Thursday   fluticasone  furoate-vilanterol  1 puff Inhalation Daily   insulin  aspart  0-15 Units Subcutaneous TID WC   levothyroxine   125 mcg Oral q morning   lisinopril   5 mg Oral Daily   loratadine   10 mg Oral QHS   methocarbamol   500 mg Oral TID   metoprolol  tartrate  25 mg Oral BID   montelukast   10 mg Oral QHS   nystatin    Topical BID   pantoprazole   40 mg Oral QPM   polyethylene glycol  17 g Oral Daily   pregabalin   225 mg Oral BID   progesterone   100 mg Oral Daily   QUEtiapine   50 mg Oral QHS   rosuvastatin   40 mg Oral Daily   senna-docusate  1 tablet Oral BID   topiramate   100 mg Oral BID   Continuous Infusions:  piperacillin -tazobactam (ZOSYN )  IV 3.375 g (05/16/24 1400)   sodium PHOSPHATE  IVPB (in mmol) 30 mmol (05/16/24 0933)    Procedures/Studies: DG Abd 1 View Result Date: 05/15/2024 CLINICAL DATA:  Ileus. EXAM: ABDOMEN - 1 VIEW COMPARISON:  CT 05/12/2024 FINDINGS: Diffuse gaseous small bowel distension, progressed from prior CT. Small volume of formed stool in the right colon. IVC filter is visualized. Skin staples in the left abdomen. IMPRESSION: Diffuse gaseous small bowel distension, progressed from prior CT, favoring ileus. Electronically Signed   By: Andrea Gasman M.D.   On: 05/15/2024 15:38   US  PELVIC COMPLETE W TRANSVAGINAL AND TORSION R/O Result Date: 05/12/2024 EXAM: US  Pelvis, Complete Transvaginal and Transabdominal without Doppler TECHNIQUE: Transabdominal and transvaginal pelvic duplex ultrasound using B-mode/gray scaled imaging without Doppler spectral analysis and color flow was obtained. COMPARISON: None provided CLINICAL HISTORY: Pelvic pain. Additional clinical history of acute  appendicitis on CT from earlier today. FINDINGS: UTERUS: Small anteverted uterus measures 3.9 x 1.7 x 2.5 cm, uterus volume 9.4 cc. No uterine fibroids. ENDOMETRIAL STRIPE: Bilayer endometrial thickness 1 mm. No focal endometrial mass. Trace fluid in the fundal endometrial cavity. RIGHT OVARY:  Right ovary measures 2.6 x 1.2 x 1.2 cm and is normal with normal arterial and venous flow on color and spectral Doppler. No adnexal mass. LEFT OVARY: Left ovary not discretely visualized. No adnexal mass. FREE FLUID: No significant free fluid in the pelvic cul-de-sac. IMPRESSION: 1. No acute findings. 2. Normal right ovary. Nonvisualization of left ovary. No adnexal masses. 3. Small anteverted uterus with trace fluid in the fundal endometrial cavity. Normal endometrial thickness. Electronically signed by: Selinda Blue MD 05/12/2024 03:08 PM EDT RP Workstation: [id number]   CT ABDOMEN PELVIS W CONTRAST Result Date: 05/12/2024 EXAM: CT ABDOMEN AND PELVIS WITH CONTRAST 05/12/2024 01:20:37 PM TECHNIQUE: CT of the abdomen and pelvis was performed with the administration of 100 mL of intravenous iohexol  (OMNIPAQUE ) 300 MG/ML solution. Multiplanar reformatted images are provided for review. Automated exposure control, iterative reconstruction, and/or weight-based adjustment of the mA/kV was utilized to reduce the radiation dose to as low as reasonably achievable. COMPARISON: 04/28/2021 CLINICAL HISTORY: Abdominal pain, acute, nonlocalized; right lower abdominal pain that radiates to right flank. Patient states she is currently being treated for a UTI and started a fever last night of 101.4. Patient states folding up a scooter and was worried about pulling a muscle. FINDINGS: LOWER CHEST: Moderate coronary calcification. Mild   aortic plaque. LIVER: Normal size and contour. GALLBLADDER AND BILE DUCTS: Gallbladder not identified. SPLEEN: Normal size. No focal lesion. PANCREAS: No mass. No ductal dilatation. ADRENAL GLANDS: Normal  appearance. No mass. KIDNEYS, URETERS AND BLADDER: No stones in the kidneys or ureters. No hydronephrosis. No perinephric or periureteral stranding. Urinary bladder is unremarkable. GI AND BOWEL: Post gastric bypass surgery. Dilated fluid-filled appendix containing 2 calcified appendicoliths, with some mucosal enhancement. No definite perforation or abscess. Significant regional inflammatory changes however. Stomach demonstrates no acute abnormality. There is no bowel obstruction. No bowel wall thickening. PERITONEUM AND RETROPERITONEUM: No ascites. No free air. VASCULATURE: Infrarenal IVC filter with caval wall perforation by several legs, no definite adjacent organ involvement. Aorta is normal in caliber. LYMPH NODES: No lymphadenopathy. REPRODUCTIVE ORGANS: No significant abnormality. BONES AND SOFT TISSUES: bilateral hip arthroplasty. Moderate umbilical hernia containing mesenteric fat. Vertebral endplate spurring at multiple levels in the lower thoracic spine. Mild spondylitic changes in the lumbar spine. No acute osseous abnormality. No focal soft tissue abnormality. IMPRESSION: 1. Acute appendicitis. No definite perforation or abscess. Findings telephoned to Dr. Melvenia at time of interpretation. 2. Moderate umbilical hernia containing mesenteric fat. 3. Infrarenal IVC filter with caval wall perforation by several legs, without definite adjacent organ involvement. Electronically signed by: Katheleen Faes MD 05/12/2024 01:50 PM EDT RP Workstation: [id number]   DG Shoulder Right Result Date: 04/19/2024 CLINICAL DATA:  Right shoulder pain EXAM: RIGHT SHOULDER - 2+ VIEW COMPARISON:  None Available. FINDINGS: No acute fracture or dislocation. Moderate joint space loss of the glenohumeral joint. Degenerative changes of the [facility] joint. Soft tissues are unremarkable. IMPRESSION: 1. No acute fracture or dislocation. 2. Moderate joint space loss of the glenohumeral joint, likely degenerative. Electronically Signed   By:  Rogelia Myers M.D.   On: 04/19/2024 17:27    Alm Schneider, DO  [facility]  If 7PM-7AM, please contact night-coverage [email or web address] Password [email or web address] 05/16/2024, 2:16 PM   LOS: 3 days

## 2024-05-16 NOTE — Progress Notes (Addendum)
 [facility]  Patient had an episode of emesis this afternoon.  I ordered a CT scan to further evaluate, as she had dilated loops of small bowel on KUB today.  Her umbilical hernia appears to contain small bowel and is causing an obstruction.  I have made the patient NPO and requested NG tube insertion.  She will need to go to the OR today for repair of her hernia and reduction of the small bowel.  I will come in to speak with the patient and obtain consent.  Addendum: Explained the findings on patient's CT scan.  Discussed that her umbilical hernia, which was previously containing omentum, now contains small bowel which appears to be causing an obstruction.  She will require OR intervention for evaluation of small bowel, reduction of the hernia, and primary repair of the hernia.  Patient again expressed that she does not want mesh at her hernia repair site.  I explained that I will not plan for mesh placement, especially give that she recently had a perforated appendicitis.  The risks and benefits of open ventral hernia repair, possible small bowel resection were discussed with the patient including but not limited to bleeding, infection, injury to surrounding structures, need for additional procedures, and recurrent hernia.  Patient is agreeable to proceeding with surgery.  Patient's NG tube appears to be appropriately positioned on KUB.  Will plan on LIS.  I called the patient's husband to update him on the need for surgery.  All questions answered to the patient's and the husband's expressed satisfaction.   Dorothyann Brittle, DO [facility] [address] [place], [place] [address] [phone] (office)

## 2024-05-16 NOTE — Progress Notes (Signed)
 [facility]  Spoke with the patient's husband in the consultation room.  I explained that she tolerated the procedure without difficulty.  I was able to reduce the small bowel in her hernia, and it appeared viable at the end of the case.  I primarily repaired her hernia.  She will have an NG tube in place until she has some return of bowel function with flatus.  Then we will remove the NG tube and slowly advance her diet.  She will be in the ICU overnight for close monitoring of her respiratory status.  All questions were answered to his expressed satisfaction.  Plan: -Patient to be transferred to the ICU for close monitoring of her respiratory status -NG to LIS.  Will get repeat KUB to evaluate location, as NG tube was slightly retracted intraoperatively -NPO -IVF per primary team -Monitor for return of bowel function -Will wait for return of flatus prior to removal of NG tube and initiation of diet -Will continue IV Zosyn  as per previous recommendations as it relates to her appendicitis -AM labs -Appreciate hospitalist recommendations  Dorothyann Brittle, DO [facility] [address] [place], [place] [address] [phone] (office)

## 2024-05-16 NOTE — Progress Notes (Signed)
 Mobility Specialist Progress Note:    05/16/24 1030  Mobility  Activity Ambulated with assistance  Level of Assistance Modified independent, requires aide device or extra time  Assistive Device Front wheel walker  Distance Ambulated (ft) 160 ft  Range of Motion/Exercises Active;All extremities  Activity Response Tolerated well  Mobility Referral Yes  Mobility visit 1 Mobility  Mobility Specialist Start Time (ACUTE ONLY) 1030  Mobility Specialist Stop Time (ACUTE ONLY) 1050  Mobility Specialist Time Calculation (min) (ACUTE ONLY) 20 min   Pt received sitting EOB, agreeable to mobility. ModI to stand and ambulate with RW. Tolerated well,asx throughout. Left in chair, all needs met.  Kazden Largo Mobility Specialist Please contact via Special educational needs teacher or  Rehab office at [phone]

## 2024-05-16 NOTE — Progress Notes (Signed)
   05/16/24 2308  BiPAP/CPAP/SIPAP  Reason BIPAP/CPAP not in use Non-compliant;NG tube in place   Patient declines CPAP at night. No unit in room at this time. Patient also has an NG tube in place.

## 2024-05-16 NOTE — Progress Notes (Signed)
 [facility] Progress Note  4 Days Post-Op  Subjective: Patient seen and examined.  She is resting in chair at the side of the bed.  She complains of overall not feeling well.  She complains of diffuse abdominal pain.  She has tolerated her diet without nausea and vomiting.  She confirms passing flatus yesterday, but confirmed to nurse earlier that she was passing flatus.  Small bowel movement documented overnight.  Per patient, she states that this was mostly mucus.  She is requesting an NG tube.  Objective: Vital signs in last 24 hours: Temp:  [98.4 F (36.9 C)-98.8 F (37.1 C)] 98.6 F (37 C) (09/03 0425) Pulse Rate:  [88-119] 96 (09/03 0425) Resp:  [16-20] 18 (09/03 0425) BP: (103-117)/(59-96) 103/59 (09/03 0425) SpO2:  [91 %-99 %] 96 % (09/03 0425) Last BM Date : 05/16/24  Intake/Output from previous day: 09/02 0701 - 09/03 0700 In: 441.9 [IV Piggyback:441.9] Out: 0  Intake/Output this shift: No intake/output data recorded.  General appearance: alert, cooperative, and no distress GI: Abdomen soft, mild distention, no percussion tenderness, mild diffuse tenderness to palpation; no rigidity, guarding, rebound tenderness; laparoscopic incision sites C/D/I with skin staples in place, umbilical hernia firm without tenderness to palpation  Lab Results:  Recent Labs    05/15/24 0433 05/16/24 0429  WBC 11.0* 10.0  HGB 11.4* 11.6*  HCT 36.2 36.9  PLT 265 299   BMET Recent Labs    05/15/24 0433 05/16/24 0429  NA 135 136  K 4.5 4.7  CL 104 101  CO2 23 26  GLUCOSE 160* 140*  BUN 17 16  CREATININE 1.36* 1.22*  CALCIUM  7.2* 7.8*   PT/INR No results for input(s): LABPROT, INR in the last 72 hours.  Studies/Results: DG Abd 1 View Result Date: 05/15/2024 CLINICAL DATA:  Ileus. EXAM: ABDOMEN - 1 VIEW COMPARISON:  CT 05/12/2024 FINDINGS: Diffuse gaseous small bowel distension, progressed from prior CT. Small volume of formed stool in the right colon.  IVC filter is visualized. Skin staples in the left abdomen. IMPRESSION: Diffuse gaseous small bowel distension, progressed from prior CT, favoring ileus. Electronically Signed   By: Andrea Gasman M.D.   On: 05/15/2024 15:38    Anti-infectives: Anti-infectives (From admission, onward)    Start     Dose/Rate Route Frequency Ordered Stop   05/12/24 2200  piperacillin -tazobactam (ZOSYN ) IVPB 3.375 g        3.375 g 12.5 mL/hr over 240 Minutes Intravenous Every 8 hours 05/12/24 1447     05/12/24 1400  piperacillin -tazobactam (ZOSYN ) IVPB 3.375 g        3.375 g 100 mL/hr over 30 Minutes Intravenous  Once 05/12/24 1346 05/12/24 1453       Assessment/Plan:  Patient is a 53 year old female who was admitted with acute appendicitis.  She is status post robotic assisted laparoscopic appendectomy on 8/31 for acute perforated appendicitis.   -Leukocytosis resolved -Continue IV Zosyn .  Will need a total of 5 days of antibiotic treatment. End dose placed on Zosyn  to complete course -KUB demonstrating dilated loops of small bowel concerning for ileus yesterday.  Will reorder KUB since patient is requesting NG tube today -Will hold off on NG tube currently given no nausea or vomiting -Will decrease diet down to clear liquids -Bowel regimen ordered with Senokot-S, MiraLAX , and Dulcolax.  Will also order another dose of magnesium  citrate today -Low threshold for CT scan if patient clinically worsens -Encourage ambulation -Discussed with the patient that she likely has a  postoperative ileus from a combination of things, including her perforated appendicitis, her significant narcotic usage, and decreased mobility.  Advised that she should try to cut back on the amount of narcotics she is getting, as it will assist with having a bowel movement -Monitor for bowel function -Continue scheduled Tylenol , Lyrica , Zanaflex , Topamax , Robaxin , and Toradol .   -Continue as needed oxycodone  -Patient will need to  follow-up with her pain clinic specialist after discharge regarding further narcotics, as I am limited with how much I can send her home with postoperatively. -Appreciate hospitalist recommendations   LOS: 3 days    Felicidad Sugarman A Brayden Brodhead 05/16/2024  Note: Portions of this report may have been transcribed using voice recognition software. Every effort has been made to ensure accuracy; however, inadvertent computerized transcription errors may still be present.

## 2024-05-16 NOTE — Transfer of Care (Signed)
 Immediate Anesthesia Transfer of Care Note  Patient: Karen Lewis  Procedure(s) Performed: REPAIR, HERNIA, VENTRAL (Abdomen)  Patient Location: ICU  Anesthesia Type:General  Level of Consciousness: awake, alert , oriented, and patient cooperative  Airway & Oxygen Therapy: Patient Spontanous Breathing and Patient connected to face mask oxygen  Post-op Assessment: Report given to RN and Post -op Vital signs reviewed and stable  Post vital signs: Reviewed and stable  Last Vitals:  Vitals Value Taken Time  BP 132/70 05/16/24 17:43  Temp 37 C 05/16/24 17:43  Pulse 104 05/16/24 17:43  Resp    SpO2 100 % 05/16/24 17:43    Last Pain:  Vitals:   05/16/24 1531  TempSrc:   PainSc: Asleep      Patients Stated Pain Goal: 6 (05/16/24 1503)  Complications: No notable events documented.

## 2024-05-16 NOTE — Anesthesia Procedure Notes (Addendum)
 Procedure Name: Intubation Date/Time: 05/16/2024 4:00 PM  Performed by: Pheobe Adine CROME, CRNAPre-anesthesia Checklist: Patient identified, Emergency Drugs available, Suction available, Patient being monitored and Timeout performed Patient Re-evaluated:Patient Re-evaluated prior to induction Oxygen Delivery Method: Circle system utilized Preoxygenation: Pre-oxygenation with 100% oxygen Induction Type: IV induction Laryngoscope Size: Mac and 3 Grade View: Grade I Tube type: Oral Number of attempts: 1 Airway Equipment and Method: Stylet and Video-laryngoscopy Placement Confirmation: ETT inserted through vocal cords under direct vision, positive ETCO2, CO2 detector and breath sounds checked- equal and bilateral Secured at: 21 cm Tube secured with: Tape

## 2024-05-17 ENCOUNTER — Encounter (HOSPITAL_COMMUNITY): Payer: Self-pay | Admitting: Surgery

## 2024-05-17 ENCOUNTER — Encounter: Payer: Self-pay | Admitting: Oncology

## 2024-05-17 DIAGNOSIS — N1831 Chronic kidney disease, stage 3a: Secondary | ICD-10-CM | POA: Diagnosis not present

## 2024-05-17 DIAGNOSIS — E66813 Obesity, class 3: Secondary | ICD-10-CM | POA: Diagnosis not present

## 2024-05-17 DIAGNOSIS — K56609 Unspecified intestinal obstruction, unspecified as to partial versus complete obstruction: Secondary | ICD-10-CM | POA: Diagnosis not present

## 2024-05-17 DIAGNOSIS — K3589 Other acute appendicitis without perforation or gangrene: Secondary | ICD-10-CM | POA: Diagnosis not present

## 2024-05-17 DIAGNOSIS — Z6841 Body Mass Index (BMI) 40.0 and over, adult: Secondary | ICD-10-CM

## 2024-05-17 LAB — SURGICAL PATHOLOGY

## 2024-05-17 LAB — GLUCOSE, CAPILLARY
Glucose-Capillary: 107 mg/dL — ABNORMAL HIGH (ref 70–99)
Glucose-Capillary: 123 mg/dL — ABNORMAL HIGH (ref 70–99)
Glucose-Capillary: 128 mg/dL — ABNORMAL HIGH (ref 70–99)
Glucose-Capillary: 135 mg/dL — ABNORMAL HIGH (ref 70–99)
Glucose-Capillary: 141 mg/dL — ABNORMAL HIGH (ref 70–99)
Glucose-Capillary: 159 mg/dL — ABNORMAL HIGH (ref 70–99)

## 2024-05-17 LAB — CULTURE, BLOOD (ROUTINE X 2)
Culture: NO GROWTH
Special Requests: ADEQUATE

## 2024-05-17 LAB — CBC
HCT: 33.7 % — ABNORMAL LOW (ref 36.0–46.0)
Hemoglobin: 10.9 g/dL — ABNORMAL LOW (ref 12.0–15.0)
MCH: 29.4 pg (ref 26.0–34.0)
MCHC: 32.3 g/dL (ref 30.0–36.0)
MCV: 90.8 fL (ref 80.0–100.0)
Platelets: 315 K/uL (ref 150–400)
RBC: 3.71 MIL/uL — ABNORMAL LOW (ref 3.87–5.11)
RDW: 13.2 % (ref 11.5–15.5)
WBC: 9.9 K/uL (ref 4.0–10.5)
nRBC: 0 % (ref 0.0–0.2)

## 2024-05-17 LAB — BASIC METABOLIC PANEL WITH GFR
Anion gap: 11 (ref 5–15)
BUN: 11 mg/dL (ref 6–20)
CO2: 23 mmol/L (ref 22–32)
Calcium: 7.8 mg/dL — ABNORMAL LOW (ref 8.9–10.3)
Chloride: 103 mmol/L (ref 98–111)
Creatinine, Ser: 0.97 mg/dL (ref 0.44–1.00)
GFR, Estimated: >60 mL/min (ref >60)
Glucose, Bld: 129 mg/dL — ABNORMAL HIGH (ref 70–99)
Potassium: 3.8 mmol/L (ref 3.5–5.1)
Sodium: 137 mmol/L (ref 135–145)

## 2024-05-17 LAB — PHOSPHORUS: Phosphorus: 4.6 mg/dL (ref 2.5–4.6)

## 2024-05-17 LAB — MAGNESIUM: Magnesium: 2.6 mg/dL — ABNORMAL HIGH (ref 1.7–2.4)

## 2024-05-17 MED ORDER — ONDANSETRON HCL 4 MG/2ML IJ SOLN: 4.0000 mg | Freq: Four times a day (QID) | INTRAMUSCULAR | Status: DC | PRN

## 2024-05-17 MED ORDER — CLONAZEPAM 0.5 MG PO TABS
0.5000 mg | ORAL_TABLET | Freq: Two times a day (BID) | ORAL | Status: DC | PRN
  Administered 2024-05-17: 0.5 mg
  Filled 2024-05-17: qty 1

## 2024-05-17 MED ORDER — ALUM & MAG HYDROXIDE-SIMETH 200-200-20 MG/5ML PO SUSP: 30.0000 mL | ORAL | Status: DC | PRN

## 2024-05-17 MED ORDER — TIZANIDINE HCL 2 MG PO TABS: 4.0000 mg | ORAL_TABLET | Freq: Two times a day (BID) | ORAL | Status: DC | PRN

## 2024-05-17 MED ORDER — ONDANSETRON HCL 4 MG PO TABS
4.0000 mg | ORAL_TABLET | Freq: Four times a day (QID) | ORAL | Status: DC | PRN
Start: 2024-05-17 — End: 2024-05-18

## 2024-05-17 MED ORDER — POTASSIUM CHLORIDE 10 MEQ/100ML IV SOLN
10.0000 meq | INTRAVENOUS | Status: AC
  Administered 2024-05-17 (×2): 10 meq via INTRAVENOUS
  Filled 2024-05-17 (×2): qty 100

## 2024-05-17 MED ORDER — PHENOL 1.4 % MT LIQD
1.0000 | OROMUCOSAL | Status: DC | PRN
  Administered 2024-05-17: 1 via OROMUCOSAL
  Filled 2024-05-17: qty 177

## 2024-05-17 MED ORDER — PANTOPRAZOLE SODIUM 40 MG IV SOLR
40.0000 mg | Freq: Two times a day (BID) | INTRAVENOUS | Status: DC
  Administered 2024-05-17 – 2024-05-19 (×5): 40 mg via INTRAVENOUS
  Filled 2024-05-17 (×5): qty 10

## 2024-05-17 MED ORDER — INSULIN ASPART 100 UNIT/ML IJ SOLN
0.0000 [IU] | INTRAMUSCULAR | Status: DC
  Administered 2024-05-17 – 2024-05-19 (×3): 1 [IU] via SUBCUTANEOUS

## 2024-05-17 MED ORDER — POLYVINYL ALCOHOL 1.4 % OP SOLN
2.0000 [drp] | OPHTHALMIC | Status: DC | PRN
  Administered 2024-05-17: 2 [drp] via OPHTHALMIC
  Filled 2024-05-17: qty 15

## 2024-05-17 MED ORDER — FAMOTIDINE IN NACL 20-0.9 MG/50ML-% IV SOLN
20.0000 mg | Freq: Two times a day (BID) | INTRAVENOUS | Status: DC
  Administered 2024-05-17 – 2024-05-19 (×5): 20 mg via INTRAVENOUS
  Filled 2024-05-17 (×5): qty 50

## 2024-05-17 MED ORDER — SIMETHICONE 80 MG PO CHEW: 80.0000 mg | CHEWABLE_TABLET | Freq: Four times a day (QID) | ORAL | Status: DC | PRN

## 2024-05-17 NOTE — Progress Notes (Signed)
 [facility] Progress Note  1 Day Post-Op  Subjective: Patient seen and examined.  She is resting comfortably in bed.  She states that she feels much better than she did preoperatively.  Her NG tube is had 450 cc of gastric contents since placement.  She denies flatus or bowel movement since her surgery.  She also denies nausea and vomiting.  Objective: Vital signs in last 24 hours: Temp:  [97.6 F (36.4 C)-98.6 F (37 C)] 97.6 F (36.4 C) (09/04 0000) Pulse Rate:  [86-117] 86 (09/04 0700) Resp:  [13-21] 13 (09/04 0700) BP: (107-159)/(60-94) 107/64 (09/04 0700) SpO2:  [92 %-100 %] 92 % (09/04 0700) Weight:  [155.4 kg] 155.4 kg (09/03 1503) Last BM Date : 05/16/24  Intake/Output from previous day: 09/03 0701 - 09/04 0700 In: 1050 [I.V.:1000; IV Piggyback:50] Out: 2900 [Urine:2450; Emesis/NG output:450] Intake/Output this shift: Total I/O In: 1025.9 [I.V.:1025.9] Out: -   General appearance: alert, cooperative, and no distress Nose: NG tube in place with minimal gastric contents in canister GI: Abdomen soft, mild distention, no percussion tenderness, mild incisional tenderness to palpation; no rigidity, guarding, rebound tenderness; laparoscopic incision sites and midline incision C/D/I with skin staples in place, honeycomb dressing overlying midline  Lab Results:  Recent Labs    05/16/24 0429 05/17/24 0459  WBC 10.0 9.9  HGB 11.6* 10.9*  HCT 36.9 33.7*  PLT 299 315   BMET Recent Labs    05/16/24 0429 05/17/24 0459  NA 136 137  K 4.7 3.8  CL 101 103  CO2 26 23  GLUCOSE 140* 129*  BUN 16 11  CREATININE 1.22* 0.97  CALCIUM  7.8* 7.8*   PT/INR No results for input(s): LABPROT, INR in the last 72 hours.  Studies/Results: DG Abd Portable 1V Result Date: 05/16/2024 CLINICAL DATA:  NG placement. EXAM: PORTABLE ABDOMEN - 1 VIEW COMPARISON:  Earlier radiograph dated 05/16/2024. FINDINGS: Enteric tube with tip and side-port in the left upper  abdomen likely in the proximal stomach. Dilated small bowel loops measure up to 5.5 cm. No free air identified. An IVC filter and bilateral hip arthroplasties noted. Degenerative changes of the spine. No acute osseous pathology. Anterior pelvic cutaneous staples. IMPRESSION: 1. Enteric tube with tip and side-port in the proximal stomach. 2. Dilated small bowel loops. Electronically Signed   By: Vanetta Chou M.D.   On: 05/16/2024 20:10   DG Abd 1 View Result Date: 05/16/2024 CLINICAL DATA:  NG tube placement. EXAM: ABDOMEN - 1 VIEW COMPARISON:  Earlier today. FINDINGS: The enteric tube is been retracted. The side port is in the distal esophagus, the tip is below the diaphragm. Unchanged dilated bowel loops in the upper abdomen. IMPRESSION: Enteric tube has been retracted. The side port is in the distal esophagus, the tip is below the diaphragm. Recommend advancement of at least 6-1/2 cm to place the side-port below the diaphragm. Electronically Signed   By: Andrea Gasman M.D.   On: 05/16/2024 18:26   DG Abd Portable 1V Result Date: 05/16/2024 CLINICAL DATA:  NG tube placement. EXAM: PORTABLE ABDOMEN - 1 VIEW COMPARISON:  CT earlier today FINDINGS: Tip and side port of the enteric tube below the diaphragm in the stomach. Gaseous bowel loops in the upper abdomen again seen. Skin staples in the left upper quadrant. IMPRESSION: Tip and side port of the enteric tube below the diaphragm in the stomach. Electronically Signed   By: Andrea Gasman M.D.   On: 05/16/2024 18:25   CT ABDOMEN PELVIS W  CONTRAST Result Date: 05/16/2024 CLINICAL DATA:  Postoperative pain. Ileus suspected. Recent appendectomy 05/12/2024 EXAM: CT ABDOMEN AND PELVIS WITH CONTRAST TECHNIQUE: Multidetector CT imaging of the abdomen and pelvis was performed using the standard protocol following bolus administration of intravenous contrast. RADIATION DOSE REDUCTION: This exam was performed according to the departmental dose-optimization  program which includes automated exposure control, adjustment of the mA and/or kV according to patient size and/or use of iterative reconstruction technique. CONTRAST:  OMNIPAQUE  IOHEXOL  300 MG/ML  SOLN COMPARISON:  CT 05/12/2024 FINDINGS: Lower chest: Lung bases are clear. Hepatobiliary: No focal hepatic lesion. Postcholecystectomy. No biliary dilatation. Pancreas: Pancreas is normal. No ductal dilatation. No pancreatic inflammation. Spleen: Normal spleen Adrenals/urinary tract: Adrenal glands and kidneys are normal. The ureters and bladder normal. Small mm take a shin in bladder evaluation related to bilateral hip prosthetics. Stomach/Bowel: There is fluid within distal esophagus. Post gastric bypass anatomy. Proximal and distal loops of small bowel fluid-filled. The distal small bowel is decompressed leading up to the terminal ileum. There is a midline ventral hernia. A loop of small bowel enters and exits the hernia sac. The loop of bowel exiting the sac is decompressed consistent with small bowel obstruction associated this ventral hernia. The hernia sac measures 7.1 cm in diameter. The hernia sac is midline beneath the umbilicus. The hernia mouth is narrow at 1.7 cm. On comparison CT the hernia sac was filled with peritoneal fat. Mild inflammation in the RIGHT lower quadrant related to appendectomy. No fluid collections or abscess. The colon and rectosigmoid colon are normal. Vascular/Lymphatic: infrarenal IVC filter noted. Abdominal aorta normal. No adenopathy. Reproductive: Post hysterectomy.  Adnexa unremarkable Other: No free fluid. Musculoskeletal: No aggressive osseous lesion. IMPRESSION: 1. Findings consistent with small bowel obstruction. Mechanical obstruction related to a loop of small bowel entering and midline ventral hernia inferior to the umbilicus. 2. Post gastric bypass anatomy. 3. Mild RIGHT lower quadrant inflammatory stranding related appendectomy. No abnormal fluid collections. 4. IVC  filter in place. These results will be called to the ordering clinician or representative by the Radiologist Assistant, and communication documented in the PACS or [employer]. Electronically Signed   By: Jackquline Boxer M.D.   On: 05/16/2024 17:51   DG Abd 1 View Result Date: 05/16/2024 CLINICAL DATA:  Ileus, hiatal hernia repair. EXAM: ABDOMEN - 1 VIEW COMPARISON:  None Available. FINDINGS: Gaseous distention of bowel appears toe predominantly involves small bowel loops. Minimal colonic gas noted. While this could reflect ileus, cannot exclude distal small bowel obstruction. No organomegaly or free air. IMPRESSION: Bowel dilatation predominantly involving small bowel could reflect ileus or distal small bowel obstruction. Electronically Signed   By: Franky Crease M.D.   On: 05/16/2024 14:39   DG Abd 1 View Result Date: 05/15/2024 CLINICAL DATA:  Ileus. EXAM: ABDOMEN - 1 VIEW COMPARISON:  CT 05/12/2024 FINDINGS: Diffuse gaseous small bowel distension, progressed from prior CT. Small volume of formed stool in the right colon. IVC filter is visualized. Skin staples in the left abdomen. IMPRESSION: Diffuse gaseous small bowel distension, progressed from prior CT, favoring ileus. Electronically Signed   By: Andrea Gasman M.D.   On: 05/15/2024 15:38    Anti-infectives: Anti-infectives (From admission, onward)    Start     Dose/Rate Route Frequency Ordered Stop   05/16/24 1400  piperacillin -tazobactam (ZOSYN ) IVPB 3.375 g        3.375 g 12.5 mL/hr over 240 Minutes Intravenous Every 8 hours 05/16/24 1015 05/17/24 0729   05/12/24  2200  piperacillin -tazobactam (ZOSYN ) IVPB 3.375 g  Status:  Discontinued        3.375 g 12.5 mL/hr over 240 Minutes Intravenous Every 8 hours 05/12/24 1447 05/16/24 1015   05/12/24 1400  piperacillin -tazobactam (ZOSYN ) IVPB 3.375 g        3.375 g 100 mL/hr over 30 Minutes Intravenous  Once 05/12/24 1346 05/12/24 1453       Assessment/Plan:  Patient is a  53 year old female who was admitted with acute appendicitis.  She is status post robotic assisted laparoscopic appendectomy on 8/31 for acute perforated appendicitis.  She subsequently developed an incarcerated umbilical hernia causing small bowel obstruction on 9/3.  She is status post open primary ventral hernia repair on 9/3.   -Leukocytosis resolved, and hemoglobin stable -Continue IV Zosyn .  Will need a total of 5 days of antibiotic treatment. End dose placed on Zosyn  to complete course -NG with minimal output.  Will continue to keep on low intermittent suction until return of flatus -NPO, okay for small amount of ice chips -Will decrease diet down to clear liquids -Will continue Dulcolax suppository at this time -Monitor for return of bowel function -Encourage ambulation -Continue scheduled Tylenol , Lyrica , Zanaflex , Topamax , Robaxin , and Toradol .   -Continue as needed oxycodone  and Dilaudid  -Appreciate hospitalist recommendations   LOS: 4 days    Brea Coleson A Teddie Curd 05/17/2024  Note: Portions of this report may have been transcribed using voice recognition software. Every effort has been made to ensure accuracy; however, inadvertent computerized transcription errors may still be present.

## 2024-05-17 NOTE — Progress Notes (Signed)
 Spoke w/ pt about removing foley she stated that she had asked [name] to keep it in until tomorrow (05-18-24) and provider was ok w/ that. Secure chat sent to [name] and [name] to verify that it is ok to remove foley tomorrow (05-18-24) and both providers said it was ok.

## 2024-05-17 NOTE — Progress Notes (Signed)
 PROGRESS NOTE  Karen Lewis FMW:[id number] DOB: August 14, 1971 DOA: 05/12/2024 PCP: [name], MD  Brief History:  53 year old female with PCOS, morbid obesity status post Roux-en-Y gastric bypass and laparoscopic gastric sleeve, fibromyalgia, chronic pain, opioid dependence, sleep apnea not on CPAP, GAD, degenerative arthritis of knees and shoulders, asthma, type 2 diabetes mellitus, GERD, gonalgia, hypothyroidism, HSV, difficult intubation, cephalgia who reports that she was treated for UTI about 5 days ago with a course of Augmentin .  About 1 week ago she was having symptoms of right lower quadrant abdominal pain and thought it was related to the UTI versus muscle strain.  She started having fever overnight.  Her temperature was reportedly 101.4 at home.  Patient was evaluated in the emergency department 8/30 and CT scanning demonstrates acute appendicitis.  She has been evaluated by the surgical team and they are planning to take her to the OR later today.  She is being admitted to the stepdown ICU bed due to history of difficult intubation, multiple medical comorbidities and need for close postoperative monitoring.   She was continued on zosyn  postop.  Her post op course was complicated by post op ileus.  Unfortunately, she developed nausea and vomiting on 05/16/2024.  Repeat CT of the abdomen and pelvis suggested bowel obstruction and her umbilical hernia.  The patient will be taken back to the OR by [name]   Assessment/Plan:  Acute appendicitis -05/12/24--robotic appendectomy -continue IV zosyn  -initially on IV dilaudid  post-op   Post op Ileus>>Bowel obstruction -9/3 CT AP with loop of bowel in umbilical hernia -appreciate [name] -05/16/24-- open ventral hernia repair -NG decompression till passing flatus -restart IVF -optimize electrolytes -05/17/24 afternoon--had small BM, passing flatus -increase ambulation -remove foley   CKD 3A -baseline creatinine  1.0-1.3 -monitor BMP   Diabetes Mellitus with hyperglycemia -05/12/24 A1C--6.6 -novolog  sliding scale -pt states she continues on ozempic, last dose 05/04/24   Opioid Dependence/Chronic pain syndrome -PDMP reviewed -oxycodone  5 mg, #90, last fill 03/15/24 -pregabalin  225mg , #180, last fill 04/24/24   Asthma, mod persistent, hx of eosinophilia -failed allergy shots -follows pulmonary  -continue singulair , Breo, albuterol  prn -she is on dupixent   Morbid obesity, s/p gastric bypass surgery (sleeve). S/p redo  ( S/P Lap RYGBP from sleeve gastrectomy)  -BMI 60.69 -lifestyle modifications   Anxiety/Depression -continue clonazepam  -PDMP shows last fill 0.5 mg, #15, 04/17/24 -continue quetiapine    History of  Mycobacterium abscessus infection  --soft tissue of the abdominal wall following lipectomy  -follow up [name]   Hypothyroidism -continue synthroid  -check TSH   GERD -continue PPI   Mixed HLD -continue statin once able to tolerate po   Chronic lower extremity swelling/lymphedema. -Moved from [place]  years ago.  Previously worked as a Engineer, civil (consulting).   Hypomagnesemia -repleted       Family Communication: spouse at bedside 9/4   Consultants:  general surgery   Code Status:  FULL / DNR   DVT Prophylaxis:  [place] Lovenox      Procedures: As Listed in Progress Note Above   Antibiotics: Zosyn  8/30>>9/4            Subjective:  Pt states abd pain is stable on IV and po opioids.  Denies f/c, cp, sob. Walked around nurse station x 3 today.   Had small BM later afternoon 9/4, passing flatus Objective: Vitals:   05/17/24 1308 05/17/24 1600 05/17/24 1638 05/17/24 1700  BP: 122/77 113/67  128/71  Pulse: 91 91    Resp: 18 17  15   Temp:   98.5 F (36.9 C)   TempSrc:   Oral   SpO2: 96%     Weight:      Height:        Intake/Output Summary (Last 24 hours) at 05/17/2024 1736 Last data filed at 05/17/2024 1639 Gross per 24 hour  Intake 1529.19 ml  Output 2350  ml  Net -820.81 ml   Weight change:  Exam:  General:  Pt is alert, follows commands appropriately, not in acute distress HEENT: No icterus, No thrush, No neck mass, [place]/AT Cardiovascular: RRR, S1/S2, no rubs, no gallops Respiratory: bibasilar rales.  No wheeze Abdomen: Soft/+BS, non tender, non distended, no guarding Extremities: trace LE edema, No lymphangitis, No petechiae, No rashes, no synovitis   Data Reviewed: I have personally reviewed following labs and imaging studies Basic Metabolic Panel: Recent Labs  Lab 05/13/24 0338 05/14/24 0402 05/15/24 0433 05/16/24 0429 05/17/24 0459  NA 137 134* 135 136 137  K 3.9 4.1 4.5 4.7 3.8  CL 104 102 104 101 103  CO2 24 23 23 26 23   GLUCOSE 146* 244* 160* 140* 129*  BUN 19 19 17 16 11   CREATININE 1.19* 1.28* 1.36* 1.22* 0.97  CALCIUM  7.7* 7.2* 7.2* 7.8* 7.8*  MG 1.8 2.1  --  3.0* 2.6*  PHOS  --   --   --  <1.0* 4.6   Liver Function Tests: Recent Labs  Lab 05/12/24 1125 05/13/24 0338 05/15/24 0433 05/16/24 0429  AST 26 17  --   --   ALT 39 26  --   --   ALKPHOS 79 64  --   --   BILITOT 0.7 0.6  --   --   PROT 7.4 6.1*  --   --   ALBUMIN 3.7 2.9* 2.7* 3.0*   Recent Labs  Lab 05/12/24 1125  LIPASE 40   No results for input(s): AMMONIA in the last 168 hours. Coagulation Profile: No results for input(s): INR, PROTIME in the last 168 hours. CBC: Recent Labs  Lab 05/13/24 0338 05/14/24 0402 05/15/24 0433 05/16/24 0429 05/17/24 0459  WBC 16.3* 11.2* 11.0* 10.0 9.9  NEUTROABS 12.8* 9.4*  --  7.8*  --   HGB 11.3* 10.5* 11.4* 11.6* 10.9*  HCT 35.1* 33.7* 36.2 36.9 33.7*  MCV 91.2 92.8 92.8 92.3 90.8  PLT 225 209 265 299 315   Cardiac Enzymes: No results for input(s): CKTOTAL, CKMB, CKMBINDEX, TROPONINI in the last 168 hours. BNP: Invalid input(s): POCBNP CBG: Recent Labs  Lab 05/17/24 0016 05/17/24 0430 05/17/24 0752 05/17/24 1156 05/17/24 1637  GLUCAP 159* 128* 123* 107* 135*    HbA1C: No results for input(s): HGBA1C in the last 72 hours. Urine analysis:    Component Value Date/Time   COLORURINE YELLOW 05/12/2024 1151   APPEARANCEUR CLEAR 05/12/2024 1151   APPEARANCEUR Hazy (A) 03/14/2023 1309   LABSPEC 1.012 05/12/2024 1151   LABSPEC 1.003 03/25/2013 2018   PHURINE 7.0 05/12/2024 1151   GLUCOSEU NEGATIVE 05/12/2024 1151   GLUCOSEU Negative 03/25/2013 2018   HGBUR NEGATIVE 05/12/2024 1151   BILIRUBINUR NEGATIVE 05/12/2024 1151   BILIRUBINUR Negative 03/14/2023 1309   BILIRUBINUR Negative 03/25/2013 2018   KETONESUR NEGATIVE 05/12/2024 1151   PROTEINUR NEGATIVE 05/12/2024 1151   UROBILINOGEN 0.2 08/04/2020 1345   UROBILINOGEN 0.2 05/09/2015 0115   NITRITE NEGATIVE 05/12/2024 1151   LEUKOCYTESUR NEGATIVE 05/12/2024 1151   LEUKOCYTESUR 2+ 03/25/2013 2018   Sepsis  Labs: @LABRCNTIP (procalcitonin:4,lacticidven:4) ) Recent Results (from the past 240 hours)  Blood culture (routine x 2)     Status: None   Collection Time: 05/12/24 12:57 PM   Specimen: Right Antecubital; Blood  Result Value Ref Range Status   Specimen Description   Final    RIGHT ANTECUBITAL BOTTLES DRAWN AEROBIC AND ANAEROBIC   Special Requests Blood Culture adequate volume  Final   Culture   Final    NO GROWTH 5 DAYS Performed at [facility], [address], [place], [place] [address]    Report Status 05/17/2024 FINAL  Final  MRSA Next Gen by PCR, Nasal     Status: None   Collection Time: 05/16/24 10:00 AM   Specimen: Nasal Mucosa; Nasal Swab  Result Value Ref Range Status   MRSA by PCR Next Gen NOT DETECTED NOT DETECTED Final    Comment: (NOTE) The GeneXpert MRSA Assay (FDA approved for NASAL specimens only), is one component of a comprehensive MRSA colonization surveillance program. It is not intended to diagnose MRSA infection nor to guide or monitor treatment for MRSA infections. Test performance is not FDA approved in patients less than 17 years old. Performed at [facility], [address] [place], [place] [address]      Scheduled Meds:  acetaminophen   1,000 mg Per Tube Q6H   amitriptyline   100 mg Per Tube QHS   bisacodyl   10 mg Rectal Daily   Chlorhexidine  Gluconate Cloth  6 each Topical Daily   DULoxetine   60 mg Oral Daily   enoxaparin  (LOVENOX ) injection  80 mg Subcutaneous Q24H   estradiol   0.05 mg Transdermal Once per day on Monday Thursday   fluticasone  furoate-vilanterol  1 puff Inhalation Daily   insulin  aspart  0-9 Units Subcutaneous Q4H   levothyroxine   125 mcg Per Tube q morning   metoprolol  tartrate  2.5 mg Intravenous Q6H   montelukast   10 mg Per Tube QHS   nystatin    Topical BID   pantoprazole  (PROTONIX ) IV  40 mg Intravenous Q12H   pregabalin   225 mg Oral BID   progesterone   100 mg Oral Daily   QUEtiapine   50 mg Per Tube QHS   topiramate   100 mg Per Tube BID   Continuous Infusions:  famotidine  (PEPCID ) IV Stopped (05/17/24 1010)   lactated ringers  100 mL/hr at 05/17/24 1639    Procedures/Studies: DG Abd Portable 1V Result Date: 05/16/2024 CLINICAL DATA:  NG placement. EXAM: PORTABLE ABDOMEN - 1 VIEW COMPARISON:  Earlier radiograph dated 05/16/2024. FINDINGS: Enteric tube with tip and side-port in the left upper abdomen likely in the proximal stomach. Dilated small bowel loops measure up to 5.5 cm. No free air identified. An IVC filter and bilateral hip arthroplasties noted. Degenerative changes of the spine. No acute osseous pathology. Anterior pelvic cutaneous staples. IMPRESSION: 1. Enteric tube with tip and side-port in the proximal stomach. 2. Dilated small bowel loops. Electronically Signed   By: Vanetta Chou M.D.   On: 05/16/2024 20:10   DG Abd 1 View Result Date: 05/16/2024 CLINICAL DATA:  NG tube placement. EXAM: ABDOMEN - 1 VIEW COMPARISON:  Earlier today. FINDINGS: The enteric tube is been retracted. The side port is in the distal esophagus, the tip is below the diaphragm. Unchanged dilated bowel loops in the upper  abdomen. IMPRESSION: Enteric tube has been retracted. The side port is in the distal esophagus, the tip is below the diaphragm. Recommend advancement of at least 6-1/2 cm to place the side-port below the  diaphragm. Electronically Signed   By: Andrea Gasman M.D.   On: 05/16/2024 18:26   DG Abd Portable 1V Result Date: 05/16/2024 CLINICAL DATA:  NG tube placement. EXAM: PORTABLE ABDOMEN - 1 VIEW COMPARISON:  CT earlier today FINDINGS: Tip and side port of the enteric tube below the diaphragm in the stomach. Gaseous bowel loops in the upper abdomen again seen. Skin staples in the left upper quadrant. IMPRESSION: Tip and side port of the enteric tube below the diaphragm in the stomach. Electronically Signed   By: Andrea Gasman M.D.   On: 05/16/2024 18:25   CT ABDOMEN PELVIS W CONTRAST Result Date: 05/16/2024 CLINICAL DATA:  Postoperative pain. Ileus suspected. Recent appendectomy 05/12/2024 EXAM: CT ABDOMEN AND PELVIS WITH CONTRAST TECHNIQUE: Multidetector CT imaging of the abdomen and pelvis was performed using the standard protocol following bolus administration of intravenous contrast. RADIATION DOSE REDUCTION: This exam was performed according to the departmental dose-optimization program which includes automated exposure control, adjustment of the mA and/or kV according to patient size and/or use of iterative reconstruction technique. CONTRAST:  OMNIPAQUE  IOHEXOL  300 MG/ML  SOLN COMPARISON:  CT 05/12/2024 FINDINGS: Lower chest: Lung bases are clear. Hepatobiliary: No focal hepatic lesion. Postcholecystectomy. No biliary dilatation. Pancreas: Pancreas is normal. No ductal dilatation. No pancreatic inflammation. Spleen: Normal spleen Adrenals/urinary tract: Adrenal glands and kidneys are normal. The ureters and bladder normal. Small mm take a shin in bladder evaluation related to bilateral hip prosthetics. Stomach/Bowel: There is fluid within distal esophagus. Post gastric bypass anatomy. Proximal and  distal loops of small bowel fluid-filled. The distal small bowel is decompressed leading up to the terminal ileum. There is a midline ventral hernia. A loop of small bowel enters and exits the hernia sac. The loop of bowel exiting the sac is decompressed consistent with small bowel obstruction associated this ventral hernia. The hernia sac measures 7.1 cm in diameter. The hernia sac is midline beneath the umbilicus. The hernia mouth is narrow at 1.7 cm. On comparison CT the hernia sac was filled with peritoneal fat. Mild inflammation in the RIGHT lower quadrant related to appendectomy. No fluid collections or abscess. The colon and rectosigmoid colon are normal. Vascular/Lymphatic: infrarenal IVC filter noted. Abdominal aorta normal. No adenopathy. Reproductive: Post hysterectomy.  Adnexa unremarkable Other: No free fluid. Musculoskeletal: No aggressive osseous lesion. IMPRESSION: 1. Findings consistent with small bowel obstruction. Mechanical obstruction related to a loop of small bowel entering and midline ventral hernia inferior to the umbilicus. 2. Post gastric bypass anatomy. 3. Mild RIGHT lower quadrant inflammatory stranding related appendectomy. No abnormal fluid collections. 4. IVC filter in place. These results will be called to the ordering clinician or representative by the Radiologist Assistant, and communication documented in the PACS or [employer]. Electronically Signed   By: Jackquline Boxer M.D.   On: 05/16/2024 17:51   DG Abd 1 View Result Date: 05/16/2024 CLINICAL DATA:  Ileus, hiatal hernia repair. EXAM: ABDOMEN - 1 VIEW COMPARISON:  None Available. FINDINGS: Gaseous distention of bowel appears toe predominantly involves small bowel loops. Minimal colonic gas noted. While this could reflect ileus, cannot exclude distal small bowel obstruction. No organomegaly or free air. IMPRESSION: Bowel dilatation predominantly involving small bowel could reflect ileus or distal small bowel  obstruction. Electronically Signed   By: Franky Crease M.D.   On: 05/16/2024 14:39   DG Abd 1 View Result Date: 05/15/2024 CLINICAL DATA:  Ileus. EXAM: ABDOMEN - 1 VIEW COMPARISON:  CT 05/12/2024 FINDINGS: Diffuse gaseous small  bowel distension, progressed from prior CT. Small volume of formed stool in the right colon. IVC filter is visualized. Skin staples in the left abdomen. IMPRESSION: Diffuse gaseous small bowel distension, progressed from prior CT, favoring ileus. Electronically Signed   By: Andrea Gasman M.D.   On: 05/15/2024 15:38   US  PELVIC COMPLETE W TRANSVAGINAL AND TORSION R/O Result Date: 05/12/2024 EXAM: US  Pelvis, Complete Transvaginal and Transabdominal without Doppler TECHNIQUE: Transabdominal and transvaginal pelvic duplex ultrasound using B-mode/gray scaled imaging without Doppler spectral analysis and color flow was obtained. COMPARISON: None provided CLINICAL HISTORY: Pelvic pain. Additional clinical history of acute appendicitis on CT from earlier today. FINDINGS: UTERUS: Small anteverted uterus measures 3.9 x 1.7 x 2.5 cm, uterus volume 9.4 cc. No uterine fibroids. ENDOMETRIAL STRIPE: Bilayer endometrial thickness 1 mm. No focal endometrial mass. Trace fluid in the fundal endometrial cavity. RIGHT OVARY: Right ovary measures 2.6 x 1.2 x 1.2 cm and is normal with normal arterial and venous flow on color and spectral Doppler. No adnexal mass. LEFT OVARY: Left ovary not discretely visualized. No adnexal mass. FREE FLUID: No significant free fluid in the pelvic cul-de-sac. IMPRESSION: 1. No acute findings. 2. Normal right ovary. Nonvisualization of left ovary. No adnexal masses. 3. Small anteverted uterus with trace fluid in the fundal endometrial cavity. Normal endometrial thickness. Electronically signed by: Selinda Blue MD 05/12/2024 03:08 PM EDT RP Workstation: [id number]   CT ABDOMEN PELVIS W CONTRAST Result Date: 05/12/2024 EXAM: CT ABDOMEN AND PELVIS WITH CONTRAST 05/12/2024  01:20:37 PM TECHNIQUE: CT of the abdomen and pelvis was performed with the administration of 100 mL of intravenous iohexol  (OMNIPAQUE ) 300 MG/ML solution. Multiplanar reformatted images are provided for review. Automated exposure control, iterative reconstruction, and/or weight-based adjustment of the mA/kV was utilized to reduce the radiation dose to as low as reasonably achievable. COMPARISON: 04/28/2021 CLINICAL HISTORY: Abdominal pain, acute, nonlocalized; right lower abdominal pain that radiates to right flank. Patient states she is currently being treated for a UTI and started a fever last night of 101.4. Patient states folding up a scooter and was worried about pulling a muscle. FINDINGS: LOWER CHEST: Moderate coronary calcification. Mild   aortic plaque. LIVER: Normal size and contour. GALLBLADDER AND BILE DUCTS: Gallbladder not identified. SPLEEN: Normal size. No focal lesion. PANCREAS: No mass. No ductal dilatation. ADRENAL GLANDS: Normal appearance. No mass. KIDNEYS, URETERS AND BLADDER: No stones in the kidneys or ureters. No hydronephrosis. No perinephric or periureteral stranding. Urinary bladder is unremarkable. GI AND BOWEL: Post gastric bypass surgery. Dilated fluid-filled appendix containing 2 calcified appendicoliths, with some mucosal enhancement. No definite perforation or abscess. Significant regional inflammatory changes however. Stomach demonstrates no acute abnormality. There is no bowel obstruction. No bowel wall thickening. PERITONEUM AND RETROPERITONEUM: No ascites. No free air. VASCULATURE: Infrarenal IVC filter with caval wall perforation by several legs, no definite adjacent organ involvement. Aorta is normal in caliber. LYMPH NODES: No lymphadenopathy. REPRODUCTIVE ORGANS: No significant abnormality. BONES AND SOFT TISSUES: bilateral hip arthroplasty. Moderate umbilical hernia containing mesenteric fat. Vertebral endplate spurring at multiple levels in the lower thoracic spine. Mild  spondylitic changes in the lumbar spine. No acute osseous abnormality. No focal soft tissue abnormality. IMPRESSION: 1. Acute appendicitis. No definite perforation or abscess. Findings telephoned to Dr. Melvenia at time of interpretation. 2. Moderate umbilical hernia containing mesenteric fat. 3. Infrarenal IVC filter with caval wall perforation by several legs, without definite adjacent organ involvement. Electronically signed by: Dayne Hassell MD 05/12/2024 01:50 PM EDT RP Workstation: [id number]  DG Shoulder Right Result Date: 04/19/2024 CLINICAL DATA:  Right shoulder pain EXAM: RIGHT SHOULDER - 2+ VIEW COMPARISON:  None Available. FINDINGS: No acute fracture or dislocation. Moderate joint space loss of the glenohumeral joint. Degenerative changes of the [facility] joint. Soft tissues are unremarkable. IMPRESSION: 1. No acute fracture or dislocation. 2. Moderate joint space loss of the glenohumeral joint, likely degenerative. Electronically Signed   By: Rogelia Myers M.D.   On: 04/19/2024 17:27    Alm Schneider, DO  [facility]  If 7PM-7AM, please contact night-coverage [email or web address] Password [email or web address] 05/17/2024, 5:36 PM   LOS: 4 days

## 2024-05-17 NOTE — Plan of Care (Signed)
  Problem: Activity: Goal: Risk for activity intolerance will decrease Outcome: Progressing   Problem: Nutrition: Goal: Adequate nutrition will be maintained Outcome: Progressing   Problem: Elimination: Goal: Will not experience complications related to urinary retention Outcome: Progressing   Problem: Pain Managment: Goal: General experience of comfort will improve and/or be controlled Outcome: Progressing   Problem: Safety: Goal: Ability to remain free from injury will improve Outcome: Progressing   Problem: Health Behavior/Discharge Planning: Goal: Ability to identify and utilize available resources and services will improve Outcome: Progressing

## 2024-05-17 NOTE — Progress Notes (Signed)
 Patient continues to decline use of CPAP at this time RT will continue to monitor patient.

## 2024-05-17 NOTE — Plan of Care (Signed)

## 2024-05-17 NOTE — Progress Notes (Signed)
 Patient walked 3 laps around the unit during this shift She is passing flatus and had small BM. Notified [name]. Per [name] keep NGT clamped and pt can have clears.

## 2024-05-18 DIAGNOSIS — K56609 Unspecified intestinal obstruction, unspecified as to partial versus complete obstruction: Secondary | ICD-10-CM | POA: Diagnosis not present

## 2024-05-18 DIAGNOSIS — K3589 Other acute appendicitis without perforation or gangrene: Secondary | ICD-10-CM | POA: Diagnosis not present

## 2024-05-18 DIAGNOSIS — F1120 Opioid dependence, uncomplicated: Secondary | ICD-10-CM

## 2024-05-18 DIAGNOSIS — E66813 Obesity, class 3: Secondary | ICD-10-CM | POA: Diagnosis not present

## 2024-05-18 LAB — GLUCOSE, CAPILLARY
Glucose-Capillary: 100 mg/dL — ABNORMAL HIGH (ref 70–99)
Glucose-Capillary: 108 mg/dL — ABNORMAL HIGH (ref 70–99)
Glucose-Capillary: 117 mg/dL — ABNORMAL HIGH (ref 70–99)
Glucose-Capillary: 119 mg/dL — ABNORMAL HIGH (ref 70–99)
Glucose-Capillary: 129 mg/dL — ABNORMAL HIGH (ref 70–99)
Glucose-Capillary: 145 mg/dL — ABNORMAL HIGH (ref 70–99)
Glucose-Capillary: 99 mg/dL (ref 70–99)

## 2024-05-18 LAB — CBC
HCT: 34.7 % — ABNORMAL LOW (ref 36.0–46.0)
Hemoglobin: 10.9 g/dL — ABNORMAL LOW (ref 12.0–15.0)
MCH: 29.2 pg (ref 26.0–34.0)
MCHC: 31.4 g/dL (ref 30.0–36.0)
MCV: 93 fL (ref 80.0–100.0)
Platelets: 309 K/uL (ref 150–400)
RBC: 3.73 MIL/uL — ABNORMAL LOW (ref 3.87–5.11)
RDW: 13.2 % (ref 11.5–15.5)
WBC: 10.2 K/uL (ref 4.0–10.5)
nRBC: 0 % (ref 0.0–0.2)

## 2024-05-18 LAB — BASIC METABOLIC PANEL WITH GFR
Anion gap: 13 (ref 5–15)
BUN: 10 mg/dL (ref 6–20)
CO2: 22 mmol/L (ref 22–32)
Calcium: 8.2 mg/dL — ABNORMAL LOW (ref 8.9–10.3)
Chloride: 105 mmol/L (ref 98–111)
Creatinine, Ser: 0.88 mg/dL (ref 0.44–1.00)
GFR, Estimated: >60 mL/min (ref >60)
Glucose, Bld: 120 mg/dL — ABNORMAL HIGH (ref 70–99)
Potassium: 4.2 mmol/L (ref 3.5–5.1)
Sodium: 140 mmol/L (ref 135–145)

## 2024-05-18 LAB — MAGNESIUM: Magnesium: 2.5 mg/dL — ABNORMAL HIGH (ref 1.7–2.4)

## 2024-05-18 LAB — PHOSPHORUS: Phosphorus: 4.2 mg/dL (ref 2.5–4.6)

## 2024-05-18 MED ORDER — ONDANSETRON HCL 4 MG PO TABS: 4.0000 mg | ORAL_TABLET | Freq: Four times a day (QID) | ORAL | Status: DC | PRN

## 2024-05-18 MED ORDER — HYDROMORPHONE HCL 1 MG/ML IJ SOLN
1.0000 mg | Freq: Four times a day (QID) | INTRAMUSCULAR | Status: DC | PRN
  Administered 2024-05-18 – 2024-05-20 (×6): 1 mg via INTRAVENOUS
  Filled 2024-05-18 (×6): qty 1

## 2024-05-18 MED ORDER — OXYCODONE HCL 5 MG PO TABS
10.0000 mg | ORAL_TABLET | ORAL | Status: DC | PRN
  Administered 2024-05-18 – 2024-05-21 (×9): 10 mg via ORAL
  Filled 2024-05-18 (×9): qty 2

## 2024-05-18 MED ORDER — CLONAZEPAM 0.5 MG PO TABS: 0.5000 mg | ORAL_TABLET | Freq: Two times a day (BID) | ORAL | Status: DC | PRN

## 2024-05-18 MED ORDER — LEVOTHYROXINE SODIUM 125 MCG PO TABS
125.0000 ug | ORAL_TABLET | Freq: Every morning | ORAL | Status: DC
  Administered 2024-05-19 – 2024-05-21 (×3): 125 ug via ORAL
  Filled 2024-05-18 (×4): qty 1

## 2024-05-18 MED ORDER — SIMETHICONE 80 MG PO CHEW: 80.0000 mg | CHEWABLE_TABLET | Freq: Four times a day (QID) | ORAL | Status: DC | PRN

## 2024-05-18 MED ORDER — TOPIRAMATE 100 MG PO TABS
100.0000 mg | ORAL_TABLET | Freq: Two times a day (BID) | ORAL | Status: DC
Start: 2024-05-18 — End: 2024-05-21
  Administered 2024-05-18 – 2024-05-21 (×6): 100 mg via ORAL
  Filled 2024-05-18 (×6): qty 1

## 2024-05-18 MED ORDER — ALUM & MAG HYDROXIDE-SIMETH 200-200-20 MG/5ML PO SUSP
30.0000 mL | ORAL | Status: DC | PRN
  Administered 2024-05-18: 30 mL via ORAL
  Filled 2024-05-18: qty 30

## 2024-05-18 MED ORDER — MONTELUKAST SODIUM 10 MG PO TABS
10.0000 mg | ORAL_TABLET | Freq: Every day | ORAL | Status: DC
  Administered 2024-05-18 – 2024-05-20 (×3): 10 mg via ORAL
  Filled 2024-05-18 (×3): qty 1

## 2024-05-18 MED ORDER — ONDANSETRON HCL 4 MG/2ML IJ SOLN: 4.0000 mg | Freq: Four times a day (QID) | INTRAMUSCULAR | Status: DC | PRN

## 2024-05-18 MED ORDER — TIZANIDINE HCL 4 MG PO TABS
4.0000 mg | ORAL_TABLET | Freq: Two times a day (BID) | ORAL | Status: DC | PRN
  Administered 2024-05-20 – 2024-05-21 (×3): 4 mg via ORAL
  Filled 2024-05-18 (×3): qty 1

## 2024-05-18 MED ORDER — ACETAMINOPHEN 500 MG PO TABS
1000.0000 mg | ORAL_TABLET | Freq: Four times a day (QID) | ORAL | Status: DC
  Administered 2024-05-18 – 2024-05-21 (×11): 1000 mg via ORAL
  Filled 2024-05-18 (×12): qty 2

## 2024-05-18 MED ORDER — QUETIAPINE FUMARATE 25 MG PO TABS
50.0000 mg | ORAL_TABLET | Freq: Every day | ORAL | Status: DC
  Administered 2024-05-18 – 2024-05-20 (×3): 50 mg via ORAL
  Filled 2024-05-18 (×3): qty 2

## 2024-05-18 MED ORDER — AMITRIPTYLINE HCL 25 MG PO TABS
100.0000 mg | ORAL_TABLET | Freq: Every day | ORAL | Status: DC
  Administered 2024-05-18 – 2024-05-20 (×3): 100 mg via ORAL
  Filled 2024-05-18 (×3): qty 4

## 2024-05-18 NOTE — Progress Notes (Signed)
 Mobility Specialist Progress Note:    05/18/24 1400  Mobility  Activity Ambulated with assistance  Level of Assistance Modified independent, requires aide device or extra time  Assistive Device Front wheel walker  Distance Ambulated (ft) 170 ft  Range of Motion/Exercises Active;All extremities  Activity Response Tolerated well  Mobility Referral Yes  Mobility visit 1 Mobility  Mobility Specialist Start Time (ACUTE ONLY) 1400  Mobility Specialist Stop Time (ACUTE ONLY) 1420  Mobility Specialist Time Calculation (min) (ACUTE ONLY) 20 min   Pt received in wheelchair, agreeable to mobility. ModI to stand and ambulate with RW. Tolerated well,asx throughout. Left pt supine, NT in room. All needs met.  Lucia Harm Mobility Specialist Please contact via Special educational needs teacher or  Rehab office at [phone]

## 2024-05-18 NOTE — Progress Notes (Addendum)
 PROGRESS NOTE  LORINDA COPLAND FMW:[id number] DOB: 08/09/1971 DOA: 05/12/2024 PCP: [name], MD  Brief History:  53 year old female with PCOS, morbid obesity status post Roux-en-Y gastric bypass and laparoscopic gastric sleeve, fibromyalgia, chronic pain, opioid dependence, sleep apnea not on CPAP, GAD, degenerative arthritis of knees and shoulders, asthma, type 2 diabetes mellitus, GERD, gonalgia, hypothyroidism, HSV, difficult intubation, cephalgia who reports that she was treated for UTI about 5 days ago with a course of Augmentin .  About 1 week ago she was having symptoms of right lower quadrant abdominal pain and thought it was related to the UTI versus muscle strain.  She started having fever overnight.  Her temperature was reportedly 101.4 at home.  Patient was evaluated in the emergency department 8/30 and CT scanning demonstrates acute appendicitis.  She has been evaluated by the surgical team and they are planning to take her to the OR later today.  She is being admitted to the stepdown ICU bed due to history of difficult intubation, multiple medical comorbidities and need for close postoperative monitoring.   She was continued on zosyn  postop.  Her post op course was complicated by post op ileus.  Unfortunately, she developed nausea and vomiting on 05/16/2024.  Repeat CT of the abdomen and pelvis suggested bowel obstruction and her umbilical hernia.  The patient will be taken back to the OR by [name]   Assessment/Plan: Acute appendicitis -05/12/24--robotic appendectomy -continue IV zosyn --finished 5 days -initially on IV dilaudid  post-op   Post op Ileus>>Bowel obstruction -9/3 CT AP with loop of bowel in umbilical hernia -appreciate [name] -05/16/24-- open ventral hernia repair -NG decompression till passing flatus -restart IVF -optimize electrolytes -05/18/24 afternoon--had small BM, passing flatus -increase ambulation -remove foley 9/5 -9/5 NG  removed -9/5 clear liquids>>full liquids for dinner -case discussed with [name]   CKD 3A -baseline creatinine 1.0-1.3 -monitor BMP   Diabetes Mellitus with hyperglycemia -05/12/24 A1C--6.6 -novolog  sliding scale -pt states she continues on ozempic, last dose 05/04/24   Opioid Dependence/Chronic pain syndrome -PDMP reviewed -oxycodone  5 mg, #90, last fill 03/15/24 -pregabalin  225mg , #180, last fill 04/24/24   Asthma, mod persistent, hx of eosinophilia -failed allergy shots -follows pulmonary  -continue singulair , Breo, albuterol  prn -she is on dupixent   Morbid obesity, s/p gastric bypass surgery (sleeve). S/p redo  ( S/P Lap RYGBP from sleeve gastrectomy)  -BMI 60.69 -lifestyle modifications   Anxiety/Depression -continue clonazepam  -PDMP shows last fill 0.5 mg, #15, 04/17/24 -continue quetiapine    History of  Mycobacterium abscessus infection  --soft tissue of the abdominal wall following lipectomy  -follow up [name]   Hypothyroidism -continue synthroid  -check TSH   GERD -continue PPI   Mixed HLD -continue statin once able to tolerate po   Chronic lower extremity swelling/lymphedema. -Moved from [place]  years ago.  Previously worked as a Engineer, civil (consulting).   Hypomagnesemia -repleted       Family Communication: spouse at bedside 9/4   Consultants:  general surgery   Code Status:  FULL / DNR   DVT Prophylaxis:  [place] Lovenox      Procedures: As Listed in Progress Note Above   Antibiotics: Zosyn  8/30>>9/4      Subjective: Had BM and flatus today.  Denies cp, sob, n/v/d.  Abd pain controlled  Objective: Vitals:   05/18/24 0900 05/18/24 1100 05/18/24 1140 05/18/24 1530  BP: 125/62 134/60  137/78  Pulse: 85 88  97  Resp: 15 16  17  Temp:   98.4 F (36.9 C) 98.2 F (36.8 C)  TempSrc:   Oral Oral  SpO2: 97% 98%  94%  Weight:      Height:        Intake/Output Summary (Last 24 hours) at 05/18/2024 1839 Last data filed at 05/18/2024 1000 Gross  per 24 hour  Intake 218.67 ml  Output 1750 ml  Net -1531.33 ml   Weight change:  Exam:  General:  Pt is alert, follows commands appropriately, not in acute distress HEENT: No icterus, No thrush, No neck mass, [place]/AT Cardiovascular: RRR, S1/S2, no rubs, no gallops Respiratory: CTA bilaterally, no wheezing, no crackles, no rhonchi Abdomen: Soft/+BS, mild diffuse tender, non distended, no guarding Extremities: No edema, No lymphangitis, No petechiae, No rashes, no synovitis   Data Reviewed: I have personally reviewed following labs and imaging studies Basic Metabolic Panel: Recent Labs  Lab 05/13/24 0338 05/14/24 0402 05/15/24 0433 05/16/24 0429 05/17/24 0459 05/18/24 0401  NA 137 134* 135 136 137 140  K 3.9 4.1 4.5 4.7 3.8 4.2  CL 104 102 104 101 103 105  CO2 24 23 23 26 23 22   GLUCOSE 146* 244* 160* 140* 129* 120*  BUN 19 19 17 16 11 10   CREATININE 1.19* 1.28* 1.36* 1.22* 0.97 0.88  CALCIUM  7.7* 7.2* 7.2* 7.8* 7.8* 8.2*  MG 1.8 2.1  --  3.0* 2.6* 2.5*  PHOS  --   --   --  <1.0* 4.6 4.2   Liver Function Tests: Recent Labs  Lab 05/12/24 1125 05/13/24 0338 05/15/24 0433 05/16/24 0429  AST 26 17  --   --   ALT 39 26  --   --   ALKPHOS 79 64  --   --   BILITOT 0.7 0.6  --   --   PROT 7.4 6.1*  --   --   ALBUMIN 3.7 2.9* 2.7* 3.0*   Recent Labs  Lab 05/12/24 1125  LIPASE 40   No results for input(s): AMMONIA in the last 168 hours. Coagulation Profile: No results for input(s): INR, PROTIME in the last 168 hours. CBC: Recent Labs  Lab 05/13/24 0338 05/14/24 0402 05/15/24 0433 05/16/24 0429 05/17/24 0459 05/18/24 0631  WBC 16.3* 11.2* 11.0* 10.0 9.9 10.2  NEUTROABS 12.8* 9.4*  --  7.8*  --   --   HGB 11.3* 10.5* 11.4* 11.6* 10.9* 10.9*  HCT 35.1* 33.7* 36.2 36.9 33.7* 34.7*  MCV 91.2 92.8 92.8 92.3 90.8 93.0  PLT 225 209 265 299 315 309   Cardiac Enzymes: No results for input(s): CKTOTAL, CKMB, CKMBINDEX, TROPONINI in the last 168  hours. BNP: Invalid input(s): POCBNP CBG: Recent Labs  Lab 05/18/24 0027 05/18/24 0359 05/18/24 0749 05/18/24 1136 05/18/24 1609  GLUCAP 100* 129* 119* 145* 99   HbA1C: No results for input(s): HGBA1C in the last 72 hours. Urine analysis:    Component Value Date/Time   COLORURINE YELLOW 05/12/2024 1151   APPEARANCEUR CLEAR 05/12/2024 1151   APPEARANCEUR Hazy (A) 03/14/2023 1309   LABSPEC 1.012 05/12/2024 1151   LABSPEC 1.003 03/25/2013 2018   PHURINE 7.0 05/12/2024 1151   GLUCOSEU NEGATIVE 05/12/2024 1151   GLUCOSEU Negative 03/25/2013 2018   HGBUR NEGATIVE 05/12/2024 1151   BILIRUBINUR NEGATIVE 05/12/2024 1151   BILIRUBINUR Negative 03/14/2023 1309   BILIRUBINUR Negative 03/25/2013 2018   KETONESUR NEGATIVE 05/12/2024 1151   PROTEINUR NEGATIVE 05/12/2024 1151   UROBILINOGEN 0.2 08/04/2020 1345   UROBILINOGEN 0.2 05/09/2015 0115   NITRITE  NEGATIVE 05/12/2024 1151   LEUKOCYTESUR NEGATIVE 05/12/2024 1151   LEUKOCYTESUR 2+ 03/25/2013 2018   Sepsis Labs: @LABRCNTIP (procalcitonin:4,lacticidven:4) ) Recent Results (from the past 240 hours)  Blood culture (routine x 2)     Status: None   Collection Time: 05/12/24 12:57 PM   Specimen: Right Antecubital; Blood  Result Value Ref Range Status   Specimen Description   Final    RIGHT ANTECUBITAL BOTTLES DRAWN AEROBIC AND ANAEROBIC   Special Requests Blood Culture adequate volume  Final   Culture   Final    NO GROWTH 5 DAYS Performed at [facility], [address] [place], [place] [address]    Report Status 05/17/2024 FINAL  Final  MRSA Next Gen by PCR, Nasal     Status: None   Collection Time: 05/16/24 10:00 AM   Specimen: Nasal Mucosa; Nasal Swab  Result Value Ref Range Status   MRSA by PCR Next Gen NOT DETECTED NOT DETECTED Final    Comment: (NOTE) The GeneXpert MRSA Assay (FDA approved for NASAL specimens only), is one component of a comprehensive MRSA colonization surveillance program. It is not intended to  diagnose MRSA infection nor to guide or monitor treatment for MRSA infections. Test performance is not FDA approved in patients less than 34 years old. Performed at [facility], [address] [place], [place] [address]      Scheduled Meds:  acetaminophen   1,000 mg Oral Q6H   amitriptyline   100 mg Oral QHS   bisacodyl   10 mg Rectal Daily   Chlorhexidine  Gluconate Cloth  6 each Topical Daily   DULoxetine   60 mg Oral Daily   enoxaparin  (LOVENOX ) injection  80 mg Subcutaneous Q24H   estradiol   0.05 mg Transdermal Once per day on Monday Thursday   fluticasone  furoate-vilanterol  1 puff Inhalation Daily   insulin  aspart  0-9 Units Subcutaneous Q4H   [START ON 05/19/2024] levothyroxine   125 mcg Oral q morning   metoprolol  tartrate  2.5 mg Intravenous Q6H   montelukast   10 mg Oral QHS   nystatin    Topical BID   pantoprazole  (PROTONIX ) IV  40 mg Intravenous Q12H   pregabalin   225 mg Oral BID   progesterone   100 mg Oral Daily   QUEtiapine   50 mg Oral QHS   topiramate   100 mg Oral BID   Continuous Infusions:  famotidine  (PEPCID ) IV 20 mg (05/18/24 1000)    Procedures/Studies: DG Abd Portable 1V Result Date: 05/16/2024 CLINICAL DATA:  NG placement. EXAM: PORTABLE ABDOMEN - 1 VIEW COMPARISON:  Earlier radiograph dated 05/16/2024. FINDINGS: Enteric tube with tip and side-port in the left upper abdomen likely in the proximal stomach. Dilated small bowel loops measure up to 5.5 cm. No free air identified. An IVC filter and bilateral hip arthroplasties noted. Degenerative changes of the spine. No acute osseous pathology. Anterior pelvic cutaneous staples. IMPRESSION: 1. Enteric tube with tip and side-port in the proximal stomach. 2. Dilated small bowel loops. Electronically Signed   By: Vanetta Chou M.D.   On: 05/16/2024 20:10   DG Abd 1 View Result Date: 05/16/2024 CLINICAL DATA:  NG tube placement. EXAM: ABDOMEN - 1 VIEW COMPARISON:  Earlier today. FINDINGS: The enteric tube is been retracted.  The side port is in the distal esophagus, the tip is below the diaphragm. Unchanged dilated bowel loops in the upper abdomen. IMPRESSION: Enteric tube has been retracted. The side port is in the distal esophagus, the tip is below the diaphragm. Recommend advancement of at least 6-1/2  cm to place the side-port below the diaphragm. Electronically Signed   By: Andrea Gasman M.D.   On: 05/16/2024 18:26   DG Abd Portable 1V Result Date: 05/16/2024 CLINICAL DATA:  NG tube placement. EXAM: PORTABLE ABDOMEN - 1 VIEW COMPARISON:  CT earlier today FINDINGS: Tip and side port of the enteric tube below the diaphragm in the stomach. Gaseous bowel loops in the upper abdomen again seen. Skin staples in the left upper quadrant. IMPRESSION: Tip and side port of the enteric tube below the diaphragm in the stomach. Electronically Signed   By: Andrea Gasman M.D.   On: 05/16/2024 18:25   CT ABDOMEN PELVIS W CONTRAST Result Date: 05/16/2024 CLINICAL DATA:  Postoperative pain. Ileus suspected. Recent appendectomy 05/12/2024 EXAM: CT ABDOMEN AND PELVIS WITH CONTRAST TECHNIQUE: Multidetector CT imaging of the abdomen and pelvis was performed using the standard protocol following bolus administration of intravenous contrast. RADIATION DOSE REDUCTION: This exam was performed according to the departmental dose-optimization program which includes automated exposure control, adjustment of the mA and/or kV according to patient size and/or use of iterative reconstruction technique. CONTRAST:  OMNIPAQUE  IOHEXOL  300 MG/ML  SOLN COMPARISON:  CT 05/12/2024 FINDINGS: Lower chest: Lung bases are clear. Hepatobiliary: No focal hepatic lesion. Postcholecystectomy. No biliary dilatation. Pancreas: Pancreas is normal. No ductal dilatation. No pancreatic inflammation. Spleen: Normal spleen Adrenals/urinary tract: Adrenal glands and kidneys are normal. The ureters and bladder normal. Small mm take a shin in bladder evaluation related to  bilateral hip prosthetics. Stomach/Bowel: There is fluid within distal esophagus. Post gastric bypass anatomy. Proximal and distal loops of small bowel fluid-filled. The distal small bowel is decompressed leading up to the terminal ileum. There is a midline ventral hernia. A loop of small bowel enters and exits the hernia sac. The loop of bowel exiting the sac is decompressed consistent with small bowel obstruction associated this ventral hernia. The hernia sac measures 7.1 cm in diameter. The hernia sac is midline beneath the umbilicus. The hernia mouth is narrow at 1.7 cm. On comparison CT the hernia sac was filled with peritoneal fat. Mild inflammation in the RIGHT lower quadrant related to appendectomy. No fluid collections or abscess. The colon and rectosigmoid colon are normal. Vascular/Lymphatic: infrarenal IVC filter noted. Abdominal aorta normal. No adenopathy. Reproductive: Post hysterectomy.  Adnexa unremarkable Other: No free fluid. Musculoskeletal: No aggressive osseous lesion. IMPRESSION: 1. Findings consistent with small bowel obstruction. Mechanical obstruction related to a loop of small bowel entering and midline ventral hernia inferior to the umbilicus. 2. Post gastric bypass anatomy. 3. Mild RIGHT lower quadrant inflammatory stranding related appendectomy. No abnormal fluid collections. 4. IVC filter in place. These results will be called to the ordering clinician or representative by the Radiologist Assistant, and communication documented in the PACS or [employer]. Electronically Signed   By: Jackquline Boxer M.D.   On: 05/16/2024 17:51   DG Abd 1 View Result Date: 05/16/2024 CLINICAL DATA:  Ileus, hiatal hernia repair. EXAM: ABDOMEN - 1 VIEW COMPARISON:  None Available. FINDINGS: Gaseous distention of bowel appears toe predominantly involves small bowel loops. Minimal colonic gas noted. While this could reflect ileus, cannot exclude distal small bowel obstruction. No organomegaly or free  air. IMPRESSION: Bowel dilatation predominantly involving small bowel could reflect ileus or distal small bowel obstruction. Electronically Signed   By: Franky Crease M.D.   On: 05/16/2024 14:39   DG Abd 1 View Result Date: 05/15/2024 CLINICAL DATA:  Ileus. EXAM: ABDOMEN - 1 VIEW COMPARISON:  CT 05/12/2024 FINDINGS: Diffuse gaseous small bowel distension, progressed from prior CT. Small volume of formed stool in the right colon. IVC filter is visualized. Skin staples in the left abdomen. IMPRESSION: Diffuse gaseous small bowel distension, progressed from prior CT, favoring ileus. Electronically Signed   By: Andrea Gasman M.D.   On: 05/15/2024 15:38   US  PELVIC COMPLETE W TRANSVAGINAL AND TORSION R/O Result Date: 05/12/2024 EXAM: US  Pelvis, Complete Transvaginal and Transabdominal without Doppler TECHNIQUE: Transabdominal and transvaginal pelvic duplex ultrasound using B-mode/gray scaled imaging without Doppler spectral analysis and color flow was obtained. COMPARISON: None provided CLINICAL HISTORY: Pelvic pain. Additional clinical history of acute appendicitis on CT from earlier today. FINDINGS: UTERUS: Small anteverted uterus measures 3.9 x 1.7 x 2.5 cm, uterus volume 9.4 cc. No uterine fibroids. ENDOMETRIAL STRIPE: Bilayer endometrial thickness 1 mm. No focal endometrial mass. Trace fluid in the fundal endometrial cavity. RIGHT OVARY: Right ovary measures 2.6 x 1.2 x 1.2 cm and is normal with normal arterial and venous flow on color and spectral Doppler. No adnexal mass. LEFT OVARY: Left ovary not discretely visualized. No adnexal mass. FREE FLUID: No significant free fluid in the pelvic cul-de-sac. IMPRESSION: 1. No acute findings. 2. Normal right ovary. Nonvisualization of left ovary. No adnexal masses. 3. Small anteverted uterus with trace fluid in the fundal endometrial cavity. Normal endometrial thickness. Electronically signed by: Selinda Blue MD 05/12/2024 03:08 PM EDT RP Workstation: [id number]    CT ABDOMEN PELVIS W CONTRAST Result Date: 05/12/2024 EXAM: CT ABDOMEN AND PELVIS WITH CONTRAST 05/12/2024 01:20:37 PM TECHNIQUE: CT of the abdomen and pelvis was performed with the administration of 100 mL of intravenous iohexol  (OMNIPAQUE ) 300 MG/ML solution. Multiplanar reformatted images are provided for review. Automated exposure control, iterative reconstruction, and/or weight-based adjustment of the mA/kV was utilized to reduce the radiation dose to as low as reasonably achievable. COMPARISON: 04/28/2021 CLINICAL HISTORY: Abdominal pain, acute, nonlocalized; right lower abdominal pain that radiates to right flank. Patient states she is currently being treated for a UTI and started a fever last night of 101.4. Patient states folding up a scooter and was worried about pulling a muscle. FINDINGS: LOWER CHEST: Moderate coronary calcification. Mild   aortic plaque. LIVER: Normal size and contour. GALLBLADDER AND BILE DUCTS: Gallbladder not identified. SPLEEN: Normal size. No focal lesion. PANCREAS: No mass. No ductal dilatation. ADRENAL GLANDS: Normal appearance. No mass. KIDNEYS, URETERS AND BLADDER: No stones in the kidneys or ureters. No hydronephrosis. No perinephric or periureteral stranding. Urinary bladder is unremarkable. GI AND BOWEL: Post gastric bypass surgery. Dilated fluid-filled appendix containing 2 calcified appendicoliths, with some mucosal enhancement. No definite perforation or abscess. Significant regional inflammatory changes however. Stomach demonstrates no acute abnormality. There is no bowel obstruction. No bowel wall thickening. PERITONEUM AND RETROPERITONEUM: No ascites. No free air. VASCULATURE: Infrarenal IVC filter with caval wall perforation by several legs, no definite adjacent organ involvement. Aorta is normal in caliber. LYMPH NODES: No lymphadenopathy. REPRODUCTIVE ORGANS: No significant abnormality. BONES AND SOFT TISSUES: bilateral hip arthroplasty. Moderate umbilical  hernia containing mesenteric fat. Vertebral endplate spurring at multiple levels in the lower thoracic spine. Mild spondylitic changes in the lumbar spine. No acute osseous abnormality. No focal soft tissue abnormality. IMPRESSION: 1. Acute appendicitis. No definite perforation or abscess. Findings telephoned to Dr. Melvenia at time of interpretation. 2. Moderate umbilical hernia containing mesenteric fat. 3. Infrarenal IVC filter with caval wall perforation by several legs, without definite adjacent organ involvement. Electronically signed by: Dayne Hassell MD 05/12/2024  01:50 PM EDT RP Workstation: [id number]   DG Shoulder Right Result Date: 04/19/2024 CLINICAL DATA:  Right shoulder pain EXAM: RIGHT SHOULDER - 2+ VIEW COMPARISON:  None Available. FINDINGS: No acute fracture or dislocation. Moderate joint space loss of the glenohumeral joint. Degenerative changes of the [facility] joint. Soft tissues are unremarkable. IMPRESSION: 1. No acute fracture or dislocation. 2. Moderate joint space loss of the glenohumeral joint, likely degenerative. Electronically Signed   By: Rogelia Myers M.D.   On: 04/19/2024 17:27    Alm Schneider, DO  [facility]  If 7PM-7AM, please contact night-coverage [email or web address] Password [facility] 05/18/2024, 6:39 PM   LOS: 5 days

## 2024-05-18 NOTE — Care Management Important Message (Signed)
 Important Message  Patient Details  Name: Karen Lewis MRN: [id number] Date of Birth: 09/05/1971   Important Message Given:  Yes - Medicare IM     Maylyn Narvaiz L Morgana Rowley 05/18/2024, 11:23 AM

## 2024-05-18 NOTE — Progress Notes (Signed)
 [facility] Progress Note  2 Days Post-Op  Subjective: Patient seen and examined.  She is resting comfortably in bed.  She started passing gas yesterday evening and states that she also had a bowel movement.  Her NG tube has been clamped overnight.  Her abdominal pain is controlled currently.  Objective: Vital signs in last 24 hours: Temp:  [97.7 F (36.5 C)-98.5 F (36.9 C)] 98.3 F (36.8 C) (09/05 0750) Pulse Rate:  [82-94] 84 (09/05 0800) Resp:  [13-28] 13 (09/05 0800) BP: (101-150)/(54-92) 125/74 (09/05 0800) SpO2:  [94 %-99 %] 95 % (09/05 0800) Last BM Date : 05/17/24  Intake/Output from previous day: 09/04 0701 - 09/05 0700 In: 1962.5 [I.V.:1547.5; NG/GT:120; IV Piggyback:295] Out: 2200 [Urine:1950; Emesis/NG output:250] Intake/Output this shift: No intake/output data recorded.  General appearance: alert, cooperative, and no distress GI: Abdomen soft, nondistended, no percussion tenderness, mild midline incisional TTP; no rigidity, guarding, or rebound tenderness; midline and laparoscopic incisions C/D/I with skin staples in place, honeycomb dressing on midline incision  Lab Results:  Recent Labs    05/17/24 0459 05/18/24 0631  WBC 9.9 10.2  HGB 10.9* 10.9*  HCT 33.7* 34.7*  PLT 315 309   BMET Recent Labs    05/17/24 0459 05/18/24 0401  NA 137 140  K 3.8 4.2  CL 103 105  CO2 23 22  GLUCOSE 129* 120*  BUN 11 10  CREATININE 0.97 0.88  CALCIUM  7.8* 8.2*   PT/INR No results for input(s): LABPROT, INR in the last 72 hours.  Studies/Results: DG Abd Portable 1V Result Date: 05/16/2024 CLINICAL DATA:  NG placement. EXAM: PORTABLE ABDOMEN - 1 VIEW COMPARISON:  Earlier radiograph dated 05/16/2024. FINDINGS: Enteric tube with tip and side-port in the left upper abdomen likely in the proximal stomach. Dilated small bowel loops measure up to 5.5 cm. No free air identified. An IVC filter and bilateral hip arthroplasties noted. Degenerative  changes of the spine. No acute osseous pathology. Anterior pelvic cutaneous staples. IMPRESSION: 1. Enteric tube with tip and side-port in the proximal stomach. 2. Dilated small bowel loops. Electronically Signed   By: Vanetta Chou M.D.   On: 05/16/2024 20:10   DG Abd 1 View Result Date: 05/16/2024 CLINICAL DATA:  NG tube placement. EXAM: ABDOMEN - 1 VIEW COMPARISON:  Earlier today. FINDINGS: The enteric tube is been retracted. The side port is in the distal esophagus, the tip is below the diaphragm. Unchanged dilated bowel loops in the upper abdomen. IMPRESSION: Enteric tube has been retracted. The side port is in the distal esophagus, the tip is below the diaphragm. Recommend advancement of at least 6-1/2 cm to place the side-port below the diaphragm. Electronically Signed   By: Andrea Gasman M.D.   On: 05/16/2024 18:26   DG Abd Portable 1V Result Date: 05/16/2024 CLINICAL DATA:  NG tube placement. EXAM: PORTABLE ABDOMEN - 1 VIEW COMPARISON:  CT earlier today FINDINGS: Tip and side port of the enteric tube below the diaphragm in the stomach. Gaseous bowel loops in the upper abdomen again seen. Skin staples in the left upper quadrant. IMPRESSION: Tip and side port of the enteric tube below the diaphragm in the stomach. Electronically Signed   By: Andrea Gasman M.D.   On: 05/16/2024 18:25   CT ABDOMEN PELVIS W CONTRAST Result Date: 05/16/2024 CLINICAL DATA:  Postoperative pain. Ileus suspected. Recent appendectomy 05/12/2024 EXAM: CT ABDOMEN AND PELVIS WITH CONTRAST TECHNIQUE: Multidetector CT imaging of the abdomen and pelvis was performed using the standard  protocol following bolus administration of intravenous contrast. RADIATION DOSE REDUCTION: This exam was performed according to the departmental dose-optimization program which includes automated exposure control, adjustment of the mA and/or kV according to patient size and/or use of iterative reconstruction technique. CONTRAST:  OMNIPAQUE   IOHEXOL  300 MG/ML  SOLN COMPARISON:  CT 05/12/2024 FINDINGS: Lower chest: Lung bases are clear. Hepatobiliary: No focal hepatic lesion. Postcholecystectomy. No biliary dilatation. Pancreas: Pancreas is normal. No ductal dilatation. No pancreatic inflammation. Spleen: Normal spleen Adrenals/urinary tract: Adrenal glands and kidneys are normal. The ureters and bladder normal. Small mm take a shin in bladder evaluation related to bilateral hip prosthetics. Stomach/Bowel: There is fluid within distal esophagus. Post gastric bypass anatomy. Proximal and distal loops of small bowel fluid-filled. The distal small bowel is decompressed leading up to the terminal ileum. There is a midline ventral hernia. A loop of small bowel enters and exits the hernia sac. The loop of bowel exiting the sac is decompressed consistent with small bowel obstruction associated this ventral hernia. The hernia sac measures 7.1 cm in diameter. The hernia sac is midline beneath the umbilicus. The hernia mouth is narrow at 1.7 cm. On comparison CT the hernia sac was filled with peritoneal fat. Mild inflammation in the RIGHT lower quadrant related to appendectomy. No fluid collections or abscess. The colon and rectosigmoid colon are normal. Vascular/Lymphatic: infrarenal IVC filter noted. Abdominal aorta normal. No adenopathy. Reproductive: Post hysterectomy.  Adnexa unremarkable Other: No free fluid. Musculoskeletal: No aggressive osseous lesion. IMPRESSION: 1. Findings consistent with small bowel obstruction. Mechanical obstruction related to a loop of small bowel entering and midline ventral hernia inferior to the umbilicus. 2. Post gastric bypass anatomy. 3. Mild RIGHT lower quadrant inflammatory stranding related appendectomy. No abnormal fluid collections. 4. IVC filter in place. These results will be called to the ordering clinician or representative by the Radiologist Assistant, and communication documented in the PACS or [employer].  Electronically Signed   By: Jackquline Boxer M.D.   On: 05/16/2024 17:51   DG Abd 1 View Result Date: 05/16/2024 CLINICAL DATA:  Ileus, hiatal hernia repair. EXAM: ABDOMEN - 1 VIEW COMPARISON:  None Available. FINDINGS: Gaseous distention of bowel appears toe predominantly involves small bowel loops. Minimal colonic gas noted. While this could reflect ileus, cannot exclude distal small bowel obstruction. No organomegaly or free air. IMPRESSION: Bowel dilatation predominantly involving small bowel could reflect ileus or distal small bowel obstruction. Electronically Signed   By: Franky Crease M.D.   On: 05/16/2024 14:39    Anti-infectives: Anti-infectives (From admission, onward)    Start     Dose/Rate Route Frequency Ordered Stop   05/16/24 1400  piperacillin -tazobactam (ZOSYN ) IVPB 3.375 g        3.375 g 12.5 mL/hr over 240 Minutes Intravenous Every 8 hours 05/16/24 1015 05/17/24 0729   05/12/24 2200  piperacillin -tazobactam (ZOSYN ) IVPB 3.375 g  Status:  Discontinued        3.375 g 12.5 mL/hr over 240 Minutes Intravenous Every 8 hours 05/12/24 1447 05/16/24 1015   05/12/24 1400  piperacillin -tazobactam (ZOSYN ) IVPB 3.375 g        3.375 g 100 mL/hr over 30 Minutes Intravenous  Once 05/12/24 1346 05/12/24 1453       Assessment/Plan:  Patient is a 53 year old female who was admitted with acute appendicitis.  She is status post robotic assisted laparoscopic appendectomy on 8/31 for acute perforated appendicitis.  She subsequently developed an incarcerated umbilical hernia causing small bowel obstruction on 9/3.  She is status post open primary ventral hernia repair on 9/3.   -Leukocytosis resolved and hemoglobin stable -Completed 5 days of Zosyn  on 9/3 -Patient with flatus and bowel movement -Will remove NG tube and start clear liquids -If tolerates clears for lunch, can advance to full liquid for dinner -Continue dulcolax suppository -Monitor for return of bowel function -Encourage  ambulation -Continue scheduled Tylenol , Lyrica , Zanaflex , Topamax , Robaxin , and Toradol .   -Continue as needed oxycodone  and Dilaudid . Dilaudid  increased to q6hr.  Would try to wean off IV narcotics -Appreciate hospitalist recommendations -Dr. Kallie will see the patient for me over the weekend   LOS: 5 days    Philmore Lepore A Annya Lizana 05/18/2024  Note: Portions of this report may have been transcribed using voice recognition software. Every effort has been made to ensure accuracy; however, inadvertent computerized transcription errors may still be present.

## 2024-05-18 NOTE — Plan of Care (Signed)
   Problem: Education: Goal: Knowledge of General Education information will improve Description: Including pain rating scale, medication(s)/side effects and non-pharmacologic comfort measures Outcome: Progressing   Problem: Clinical Measurements: Goal: Ability to maintain clinical measurements within normal limits will improve Outcome: Progressing Goal: Diagnostic test results will improve Outcome: Progressing

## 2024-05-19 DIAGNOSIS — K3589 Other acute appendicitis without perforation or gangrene: Secondary | ICD-10-CM | POA: Diagnosis not present

## 2024-05-19 DIAGNOSIS — F1920 Other psychoactive substance dependence, uncomplicated: Secondary | ICD-10-CM | POA: Diagnosis not present

## 2024-05-19 DIAGNOSIS — K56609 Unspecified intestinal obstruction, unspecified as to partial versus complete obstruction: Secondary | ICD-10-CM | POA: Diagnosis not present

## 2024-05-19 DIAGNOSIS — E66813 Obesity, class 3: Secondary | ICD-10-CM | POA: Diagnosis not present

## 2024-05-19 LAB — GLUCOSE, CAPILLARY
Glucose-Capillary: 109 mg/dL — ABNORMAL HIGH (ref 70–99)
Glucose-Capillary: 109 mg/dL — ABNORMAL HIGH (ref 70–99)
Glucose-Capillary: 148 mg/dL — ABNORMAL HIGH (ref 70–99)
Glucose-Capillary: 129 mg/dL — ABNORMAL HIGH (ref 70–99)
Glucose-Capillary: 102 mg/dL — ABNORMAL HIGH (ref 70–99)

## 2024-05-19 LAB — MAGNESIUM: Magnesium: 2.2 mg/dL (ref 1.7–2.4)

## 2024-05-19 LAB — CBC
HCT: 36.9 % (ref 36.0–46.0)
Hemoglobin: 11.4 g/dL — ABNORMAL LOW (ref 12.0–15.0)
MCH: 28.6 pg (ref 26.0–34.0)
MCHC: 30.9 g/dL (ref 30.0–36.0)
MCV: 92.7 fL (ref 80.0–100.0)
Platelets: 343 K/uL (ref 150–400)
RBC: 3.98 MIL/uL (ref 3.87–5.11)
RDW: 13 % (ref 11.5–15.5)
WBC: 8.4 K/uL (ref 4.0–10.5)
nRBC: 0 % (ref 0.0–0.2)

## 2024-05-19 LAB — BASIC METABOLIC PANEL WITH GFR
Anion gap: 9 (ref 5–15)
BUN: 9 mg/dL (ref 6–20)
CO2: 23 mmol/L (ref 22–32)
Calcium: 8.2 mg/dL — ABNORMAL LOW (ref 8.9–10.3)
Chloride: 107 mmol/L (ref 98–111)
Creatinine, Ser: 0.86 mg/dL (ref 0.44–1.00)
GFR, Estimated: >60 mL/min (ref >60)
Glucose, Bld: 113 mg/dL — ABNORMAL HIGH (ref 70–99)
Potassium: 3.9 mmol/L (ref 3.5–5.1)
Sodium: 139 mmol/L (ref 135–145)

## 2024-05-19 LAB — PHOSPHORUS: Phosphorus: 4.1 mg/dL (ref 2.5–4.6)

## 2024-05-19 MED ORDER — PANTOPRAZOLE SODIUM 40 MG PO TBEC
40.0000 mg | DELAYED_RELEASE_TABLET | Freq: Two times a day (BID) | ORAL | Status: DC
  Administered 2024-05-19 – 2024-05-21 (×4): 40 mg via ORAL
  Filled 2024-05-19 (×4): qty 1

## 2024-05-19 MED ORDER — INSULIN ASPART 100 UNIT/ML IJ SOLN: 0.0000 [IU] | Freq: Three times a day (TID) | INTRAMUSCULAR | Status: DC

## 2024-05-19 MED ORDER — FAMOTIDINE 20 MG PO TABS
20.0000 mg | ORAL_TABLET | Freq: Two times a day (BID) | ORAL | Status: DC
  Administered 2024-05-19 – 2024-05-21 (×4): 20 mg via ORAL
  Filled 2024-05-19 (×4): qty 1

## 2024-05-19 MED ORDER — INSULIN ASPART 100 UNIT/ML IJ SOLN: 0.0000 [IU] | Freq: Every day | INTRAMUSCULAR | Status: DC

## 2024-05-19 MED ORDER — METOPROLOL TARTRATE 25 MG PO TABS
25.0000 mg | ORAL_TABLET | Freq: Two times a day (BID) | ORAL | Status: DC
  Administered 2024-05-19 – 2024-05-21 (×4): 25 mg via ORAL
  Filled 2024-05-19 (×4): qty 1

## 2024-05-19 MED ORDER — INSULIN ASPART 100 UNIT/ML IJ SOLN
0.0000 [IU] | Freq: Three times a day (TID) | INTRAMUSCULAR | Status: DC
  Administered 2024-05-19 – 2024-05-20 (×2): 1 [IU] via SUBCUTANEOUS
  Administered 2024-05-21: 2 [IU] via SUBCUTANEOUS

## 2024-05-19 NOTE — Progress Notes (Signed)
 PROGRESS NOTE  Karen Lewis FMW:[id number] DOB: [birth year] DOA: 05/12/2024 PCP: [name], MD  Brief History:  53 year old female with PCOS, morbid obesity status post Roux-en-Y gastric bypass and laparoscopic gastric sleeve, fibromyalgia, chronic pain, opioid dependence, sleep apnea not on CPAP, GAD, degenerative arthritis of knees and shoulders, asthma, type 2 diabetes mellitus, GERD, gonalgia, hypothyroidism, HSV, difficult intubation, cephalgia who reports that she was treated for UTI about 5 days ago with a course of Augmentin .  About 1 week ago she was having symptoms of right lower quadrant abdominal pain and thought it was related to the UTI versus muscle strain.  She started having fever overnight.  Her temperature was reportedly 101.4 at home.  Patient was evaluated in the emergency department 8/30 and CT scanning demonstrates acute appendicitis.  She has been evaluated by the surgical team and they are planning to take her to the OR later today.  She is being admitted to the stepdown ICU bed due to history of difficult intubation, multiple medical comorbidities and need for close postoperative monitoring.   She was continued on zosyn  postop.  Her post op course was complicated by post op ileus.  Unfortunately, she developed nausea and vomiting on 05/16/2024.  Repeat CT of the abdomen and pelvis suggested bowel obstruction and her umbilical hernia.  The patient will be taken back to the OR by [name]   Assessment/Plan: Acute appendicitis -05/12/24--robotic appendectomy -continue IV zosyn --finished 5 days -initially on IV dilaudid  post-op   Post op Ileus>>Bowel obstruction -9/3 CT AP with loop of bowel in umbilical hernia -appreciate [name] -05/16/24-- open ventral hernia repair -NG decompression till passing flatus -restart IVF -optimize electrolytes -05/18/24 afternoon--had small BM, passing flatus -increase ambulation -remove foley 9/5 -9/5 NG  removed -9/5 clear liquids>>full liquids for dinner -9/6--advanced to soft diet   CKD 3A -baseline creatinine 1.0-1.3 -monitor BMP   Diabetes Mellitus with hyperglycemia -05/12/24 A1C--6.6 -novolog  sliding scale -pt states she continues on ozempic, last dose 05/04/24   Opioid Dependence/Chronic pain syndrome -PDMP reviewed -oxycodone  5 mg, #90, last fill 03/15/24 -pregabalin  225mg , #180, last fill 04/24/24   Asthma, mod persistent, hx of eosinophilia -failed allergy shots -follows pulmonary  -continue singulair , Breo, albuterol  prn -she is on dupixent   Morbid obesity, s/p gastric bypass surgery (sleeve). S/p redo  ( S/P Lap RYGBP from sleeve gastrectomy)  -BMI 60.69 -lifestyle modifications   Anxiety/Depression -continue clonazepam  -PDMP shows last fill 0.5 mg, #15, 04/17/24 -continue quetiapine    History of  Mycobacterium abscessus infection  --soft tissue of the abdominal wall following lipectomy  -follow up [name]   Hypothyroidism -continue synthroid    GERD -continue PPI   Mixed HLD -continue statin once able to tolerate po   Chronic lower extremity swelling/lymphedema. -Moved from [place]  years ago.  Previously worked as a Engineer, civil (consulting).   Hypomagnesemia -repleted       Family Communication: spouse at bedside 9/6   Consultants:  general surgery   Code Status:  FULL    DVT Prophylaxis:  [place] Lovenox      Procedures: As Listed in Progress Note Above   Antibiotics: Zosyn  8/30>>9/4       Subjective:  Patient denies fevers, chills, headache, chest pain, dyspnea, nausea, vomiting, diarrhea, dysuria, hematuria, hematochezia, and melena.  Abd pain is controlled.  Had BM today  Objective: Vitals:   05/19/24 0822 05/19/24 0834 05/19/24 1202 05/19/24 1303  BP:  101/81 138/85 134/80  Pulse:  84 (!) 102 84  Resp:  18 18 20   Temp:  98.2 F (36.8 C)  98.2 F (36.8 C)  TempSrc:  Oral  Oral  SpO2: 93% 96% 95% 97%  Weight:      Height:         Intake/Output Summary (Last 24 hours) at 05/19/2024 1813 Last data filed at 05/19/2024 0900 Gross per 24 hour  Intake 840 ml  Output --  Net 840 ml   Weight change:  Exam:  General:  Pt is alert, follows commands appropriately, not in acute distress HEENT: No icterus, No thrush, No neck mass, [place]/AT Cardiovascular: RRR, S1/S2, no rubs, no gallops Respiratory: CTA bilaterally, no wheezing, no crackles, no rhonchi Abdomen: Soft/+BS, +incisional tender, non distended, no guarding Extremities: No edema, No lymphangitis, No petechiae, No rashes, no synovitis   Data Reviewed: I have personally reviewed following labs and imaging studies Basic Metabolic Panel: Recent Labs  Lab 05/14/24 0402 05/15/24 0433 05/16/24 0429 05/17/24 0459 05/18/24 0401 05/19/24 0425  NA 134* 135 136 137 140 139  K 4.1 4.5 4.7 3.8 4.2 3.9  CL 102 104 101 103 105 107  CO2 23 23 26 23 22 23   GLUCOSE 244* 160* 140* 129* 120* 113*  BUN 19 17 16 11 10 9   CREATININE 1.28* 1.36* 1.22* 0.97 0.88 0.86  CALCIUM  7.2* 7.2* 7.8* 7.8* 8.2* 8.2*  MG 2.1  --  3.0* 2.6* 2.5* 2.2  PHOS  --   --  <1.0* 4.6 4.2 4.1   Liver Function Tests: Recent Labs  Lab 05/13/24 0338 05/15/24 0433 05/16/24 0429  AST 17  --   --   ALT 26  --   --   ALKPHOS 64  --   --   BILITOT 0.6  --   --   PROT 6.1*  --   --   ALBUMIN 2.9* 2.7* 3.0*   No results for input(s): LIPASE, AMYLASE in the last 168 hours. No results for input(s): AMMONIA in the last 168 hours. Coagulation Profile: No results for input(s): INR, PROTIME in the last 168 hours. CBC: Recent Labs  Lab 05/13/24 0338 05/14/24 0402 05/15/24 0433 05/16/24 0429 05/17/24 0459 05/18/24 0631 05/19/24 0425  WBC 16.3* 11.2* 11.0* 10.0 9.9 10.2 8.4  NEUTROABS 12.8* 9.4*  --  7.8*  --   --   --   HGB 11.3* 10.5* 11.4* 11.6* 10.9* 10.9* 11.4*  HCT 35.1* 33.7* 36.2 36.9 33.7* 34.7* 36.9  MCV 91.2 92.8 92.8 92.3 90.8 93.0 92.7  PLT 225 209 265 299 315 309 343    Cardiac Enzymes: No results for input(s): CKTOTAL, CKMB, CKMBINDEX, TROPONINI in the last 168 hours. BNP: Invalid input(s): POCBNP CBG: Recent Labs  Lab 05/18/24 2351 05/19/24 0408 05/19/24 0730 05/19/24 1117 05/19/24 1626  GLUCAP 117* 109* 109* 148* 129*   HbA1C: No results for input(s): HGBA1C in the last 72 hours. Urine analysis:    Component Value Date/Time   COLORURINE YELLOW 05/12/2024 1151   APPEARANCEUR CLEAR 05/12/2024 1151   APPEARANCEUR Hazy (A) 03/14/2023 1309   LABSPEC 1.012 05/12/2024 1151   LABSPEC 1.003 03/25/2013 2018   PHURINE 7.0 05/12/2024 1151   GLUCOSEU NEGATIVE 05/12/2024 1151   GLUCOSEU Negative 03/25/2013 2018   HGBUR NEGATIVE 05/12/2024 1151   BILIRUBINUR NEGATIVE 05/12/2024 1151   BILIRUBINUR Negative 03/14/2023 1309   BILIRUBINUR Negative 03/25/2013 2018   KETONESUR NEGATIVE 05/12/2024 1151   PROTEINUR NEGATIVE 05/12/2024 1151   UROBILINOGEN 0.2 08/04/2020 1345  UROBILINOGEN 0.2 05/09/2015 0115   NITRITE NEGATIVE 05/12/2024 1151   LEUKOCYTESUR NEGATIVE 05/12/2024 1151   LEUKOCYTESUR 2+ 03/25/2013 2018   Sepsis Labs: @LABRCNTIP (procalcitonin:4,lacticidven:4) ) Recent Results (from the past 240 hours)  Blood culture (routine x 2)     Status: None   Collection Time: 05/12/24 12:57 PM   Specimen: Right Antecubital; Blood  Result Value Ref Range Status   Specimen Description   Final    RIGHT ANTECUBITAL BOTTLES DRAWN AEROBIC AND ANAEROBIC   Special Requests Blood Culture adequate volume  Final   Culture   Final    NO GROWTH 5 DAYS Performed at [facility], [address], [place], [place] [address]    Report Status 05/17/2024 FINAL  Final  MRSA Next Gen by PCR, Nasal     Status: None   Collection Time: 05/16/24 10:00 AM   Specimen: Nasal Mucosa; Nasal Swab  Result Value Ref Range Status   MRSA by PCR Next Gen NOT DETECTED NOT DETECTED Final    Comment: (NOTE) The GeneXpert MRSA Assay (FDA approved for NASAL specimens  only), is one component of a comprehensive MRSA colonization surveillance program. It is not intended to diagnose MRSA infection nor to guide or monitor treatment for MRSA infections. Test performance is not FDA approved in patients less than 82 years old. Performed at [facility], [address], [place], [place] [address]      Scheduled Meds:  acetaminophen   1,000 mg Oral Q6H   amitriptyline   100 mg Oral QHS   bisacodyl   10 mg Rectal Daily   Chlorhexidine  Gluconate Cloth  6 each Topical Daily   DULoxetine   60 mg Oral Daily   enoxaparin  (LOVENOX ) injection  80 mg Subcutaneous Q24H   estradiol   0.05 mg Transdermal Once per day on Monday Thursday   famotidine   20 mg Oral BID   fluticasone  furoate-vilanterol  1 puff Inhalation Daily   insulin  aspart  0-5 Units Subcutaneous QHS   insulin  aspart  0-9 Units Subcutaneous TID WC   levothyroxine   125 mcg Oral q morning   metoprolol  tartrate  2.5 mg Intravenous Q6H   metoprolol  tartrate  25 mg Oral BID   montelukast   10 mg Oral QHS   nystatin    Topical BID   pantoprazole   40 mg Oral BID   pregabalin   225 mg Oral BID   progesterone   100 mg Oral Daily   QUEtiapine   50 mg Oral QHS   topiramate   100 mg Oral BID   Continuous Infusions:  Procedures/Studies: DG Abd Portable 1V Result Date: 05/16/2024 CLINICAL DATA:  NG placement. EXAM: PORTABLE ABDOMEN - 1 VIEW COMPARISON:  Earlier radiograph dated 05/16/2024. FINDINGS: Enteric tube with tip and side-port in the left upper abdomen likely in the proximal stomach. Dilated small bowel loops measure up to 5.5 cm. No free air identified. An IVC filter and bilateral hip arthroplasties noted. Degenerative changes of the spine. No acute osseous pathology. Anterior pelvic cutaneous staples. IMPRESSION: 1. Enteric tube with tip and side-port in the proximal stomach. 2. Dilated small bowel loops. Electronically Signed   By: Vanetta Chou M.D.   On: 05/16/2024 20:10   DG Abd 1 View Result Date:  05/16/2024 CLINICAL DATA:  NG tube placement. EXAM: ABDOMEN - 1 VIEW COMPARISON:  Earlier today. FINDINGS: The enteric tube is been retracted. The side port is in the distal esophagus, the tip is below the diaphragm. Unchanged dilated bowel loops in the upper abdomen. IMPRESSION: Enteric tube has been retracted. The  side port is in the distal esophagus, the tip is below the diaphragm. Recommend advancement of at least 6-1/2 cm to place the side-port below the diaphragm. Electronically Signed   By: Andrea Gasman M.D.   On: 05/16/2024 18:26   DG Abd Portable 1V Result Date: 05/16/2024 CLINICAL DATA:  NG tube placement. EXAM: PORTABLE ABDOMEN - 1 VIEW COMPARISON:  CT earlier today FINDINGS: Tip and side port of the enteric tube below the diaphragm in the stomach. Gaseous bowel loops in the upper abdomen again seen. Skin staples in the left upper quadrant. IMPRESSION: Tip and side port of the enteric tube below the diaphragm in the stomach. Electronically Signed   By: Andrea Gasman M.D.   On: 05/16/2024 18:25   CT ABDOMEN PELVIS W CONTRAST Result Date: 05/16/2024 CLINICAL DATA:  Postoperative pain. Ileus suspected. Recent appendectomy 05/12/2024 EXAM: CT ABDOMEN AND PELVIS WITH CONTRAST TECHNIQUE: Multidetector CT imaging of the abdomen and pelvis was performed using the standard protocol following bolus administration of intravenous contrast. RADIATION DOSE REDUCTION: This exam was performed according to the departmental dose-optimization program which includes automated exposure control, adjustment of the mA and/or kV according to patient size and/or use of iterative reconstruction technique. CONTRAST:  OMNIPAQUE  IOHEXOL  300 MG/ML  SOLN COMPARISON:  CT 05/12/2024 FINDINGS: Lower chest: Lung bases are clear. Hepatobiliary: No focal hepatic lesion. Postcholecystectomy. No biliary dilatation. Pancreas: Pancreas is normal. No ductal dilatation. No pancreatic inflammation. Spleen: Normal spleen  Adrenals/urinary tract: Adrenal glands and kidneys are normal. The ureters and bladder normal. Small mm take a shin in bladder evaluation related to bilateral hip prosthetics. Stomach/Bowel: There is fluid within distal esophagus. Post gastric bypass anatomy. Proximal and distal loops of small bowel fluid-filled. The distal small bowel is decompressed leading up to the terminal ileum. There is a midline ventral hernia. A loop of small bowel enters and exits the hernia sac. The loop of bowel exiting the sac is decompressed consistent with small bowel obstruction associated this ventral hernia. The hernia sac measures 7.1 cm in diameter. The hernia sac is midline beneath the umbilicus. The hernia mouth is narrow at 1.7 cm. On comparison CT the hernia sac was filled with peritoneal fat. Mild inflammation in the RIGHT lower quadrant related to appendectomy. No fluid collections or abscess. The colon and rectosigmoid colon are normal. Vascular/Lymphatic: infrarenal IVC filter noted. Abdominal aorta normal. No adenopathy. Reproductive: Post hysterectomy.  Adnexa unremarkable Other: No free fluid. Musculoskeletal: No aggressive osseous lesion. IMPRESSION: 1. Findings consistent with small bowel obstruction. Mechanical obstruction related to a loop of small bowel entering and midline ventral hernia inferior to the umbilicus. 2. Post gastric bypass anatomy. 3. Mild RIGHT lower quadrant inflammatory stranding related appendectomy. No abnormal fluid collections. 4. IVC filter in place. These results will be called to the ordering clinician or representative by the Radiologist Assistant, and communication documented in the PACS or [employer]. Electronically Signed   By: Jackquline Boxer M.D.   On: 05/16/2024 17:51   DG Abd 1 View Result Date: 05/16/2024 CLINICAL DATA:  Ileus, hiatal hernia repair. EXAM: ABDOMEN - 1 VIEW COMPARISON:  None Available. FINDINGS: Gaseous distention of bowel appears toe predominantly involves  small bowel loops. Minimal colonic gas noted. While this could reflect ileus, cannot exclude distal small bowel obstruction. No organomegaly or free air. IMPRESSION: Bowel dilatation predominantly involving small bowel could reflect ileus or distal small bowel obstruction. Electronically Signed   By: Franky Crease M.D.   On: 05/16/2024 14:39  DG Abd 1 View Result Date: 05/15/2024 CLINICAL DATA:  Ileus. EXAM: ABDOMEN - 1 VIEW COMPARISON:  CT 05/12/2024 FINDINGS: Diffuse gaseous small bowel distension, progressed from prior CT. Small volume of formed stool in the right colon. IVC filter is visualized. Skin staples in the left abdomen. IMPRESSION: Diffuse gaseous small bowel distension, progressed from prior CT, favoring ileus. Electronically Signed   By: Andrea Gasman M.D.   On: 05/15/2024 15:38   US  PELVIC COMPLETE W TRANSVAGINAL AND TORSION R/O Result Date: 05/12/2024 EXAM: US  Pelvis, Complete Transvaginal and Transabdominal without Doppler TECHNIQUE: Transabdominal and transvaginal pelvic duplex ultrasound using B-mode/gray scaled imaging without Doppler spectral analysis and color flow was obtained. COMPARISON: None provided CLINICAL HISTORY: Pelvic pain. Additional clinical history of acute appendicitis on CT from earlier today. FINDINGS: UTERUS: Small anteverted uterus measures 3.9 x 1.7 x 2.5 cm, uterus volume 9.4 cc. No uterine fibroids. ENDOMETRIAL STRIPE: Bilayer endometrial thickness 1 mm. No focal endometrial mass. Trace fluid in the fundal endometrial cavity. RIGHT OVARY: Right ovary measures 2.6 x 1.2 x 1.2 cm and is normal with normal arterial and venous flow on color and spectral Doppler. No adnexal mass. LEFT OVARY: Left ovary not discretely visualized. No adnexal mass. FREE FLUID: No significant free fluid in the pelvic cul-de-sac. IMPRESSION: 1. No acute findings. 2. Normal right ovary. Nonvisualization of left ovary. No adnexal masses. 3. Small anteverted uterus with trace fluid in the  fundal endometrial cavity. Normal endometrial thickness. Electronically signed by: Selinda Blue MD 05/12/2024 03:08 PM EDT RP Workstation: [id number]   CT ABDOMEN PELVIS W CONTRAST Result Date: 05/12/2024 EXAM: CT ABDOMEN AND PELVIS WITH CONTRAST 05/12/2024 01:20:37 PM TECHNIQUE: CT of the abdomen and pelvis was performed with the administration of 100 mL of intravenous iohexol  (OMNIPAQUE ) 300 MG/ML solution. Multiplanar reformatted images are provided for review. Automated exposure control, iterative reconstruction, and/or weight-based adjustment of the mA/kV was utilized to reduce the radiation dose to as low as reasonably achievable. COMPARISON: 04/28/2021 CLINICAL HISTORY: Abdominal pain, acute, nonlocalized; right lower abdominal pain that radiates to right flank. Patient states she is currently being treated for a UTI and started a fever last night of 101.4. Patient states folding up a scooter and was worried about pulling a muscle. FINDINGS: LOWER CHEST: Moderate coronary calcification. Mild   aortic plaque. LIVER: Normal size and contour. GALLBLADDER AND BILE DUCTS: Gallbladder not identified. SPLEEN: Normal size. No focal lesion. PANCREAS: No mass. No ductal dilatation. ADRENAL GLANDS: Normal appearance. No mass. KIDNEYS, URETERS AND BLADDER: No stones in the kidneys or ureters. No hydronephrosis. No perinephric or periureteral stranding. Urinary bladder is unremarkable. GI AND BOWEL: Post gastric bypass surgery. Dilated fluid-filled appendix containing 2 calcified appendicoliths, with some mucosal enhancement. No definite perforation or abscess. Significant regional inflammatory changes however. Stomach demonstrates no acute abnormality. There is no bowel obstruction. No bowel wall thickening. PERITONEUM AND RETROPERITONEUM: No ascites. No free air. VASCULATURE: Infrarenal IVC filter with caval wall perforation by several legs, no definite adjacent organ involvement. Aorta is normal in caliber. LYMPH  NODES: No lymphadenopathy. REPRODUCTIVE ORGANS: No significant abnormality. BONES AND SOFT TISSUES: bilateral hip arthroplasty. Moderate umbilical hernia containing mesenteric fat. Vertebral endplate spurring at multiple levels in the lower thoracic spine. Mild spondylitic changes in the lumbar spine. No acute osseous abnormality. No focal soft tissue abnormality. IMPRESSION: 1. Acute appendicitis. No definite perforation or abscess. Findings telephoned to Dr. Melvenia at time of interpretation. 2. Moderate umbilical hernia containing mesenteric fat. 3. Infrarenal IVC filter with  caval wall perforation by several legs, without definite adjacent organ involvement. Electronically signed by: Katheleen Faes MD 05/12/2024 01:50 PM EDT RP Workstation: [id number]    Alm Schneider, DO  [facility]  If 7PM-7AM, please contact night-coverage [email or web address] Password [email or web address] 05/19/2024, 6:13 PM   LOS: 6 days

## 2024-05-19 NOTE — Progress Notes (Signed)
 [facility]  Still with pain. Had Bm.   BP 134/80 (BP Location: Right Wrist)   Pulse 84   Temp 98.2 F (36.8 C) (Oral)   Resp 20   Ht 5' 3 (1.6 m)   Wt (!) 155.4 kg   LMP  (LMP Unknown)   SpO2 97%   BMI 60.69 kg/m  Soft, midline with staples, honeycomb in place no erythema or drainage Port sites c/d/I with staples  No erythema or drainage of incisions  Patient who was admitted with acute appendicitis. She is status post robotic assisted laparoscopic appendectomy on 8/31 for acute perforated appendicitis. She subsequently developed an incarcerated umbilical hernia causing small bowel obstruction on 9/3. She is status post open primary ventral hernia repair on 9/3.   Soft diet Bowel function starting Continue to use roxicodone  and try to avoid any IV pain medication  Manuelita Pander, MD [facility] [address] [place], [place] [address] [phone] (office)

## 2024-05-19 NOTE — Plan of Care (Signed)
  Problem: Health Behavior/Discharge Planning: Goal: Ability to manage health-related needs will improve Outcome: Progressing   Problem: Clinical Measurements: Goal: Will remain free from infection Outcome: Progressing   Problem: Activity: Goal: Risk for activity intolerance will decrease Outcome: Progressing   Problem: Coping: Goal: Level of anxiety will decrease Outcome: Progressing   Problem: Elimination: Goal: Will not experience complications related to urinary retention Outcome: Progressing   Problem: Pain Managment: Goal: General experience of comfort will improve and/or be controlled Outcome: Progressing   Problem: Skin Integrity: Goal: Risk for impaired skin integrity will decrease Outcome: Progressing   Problem: Metabolic: Goal: Ability to maintain appropriate glucose levels will improve Outcome: Progressing   Problem: Skin Integrity: Goal: Risk for impaired skin integrity will decrease Outcome: Progressing   Problem: Tissue Perfusion: Goal: Adequacy of tissue perfusion will improve Outcome: Progressing

## 2024-05-19 NOTE — Progress Notes (Signed)
 Nurse at bedside,patient alert and oriented times four.Patient did c/o abdominal pain rated a 5,a sharp,tight,burning pain that's constant.Plan of care on going.

## 2024-05-19 NOTE — Plan of Care (Signed)

## 2024-05-20 DIAGNOSIS — Z6841 Body Mass Index (BMI) 40.0 and over, adult: Secondary | ICD-10-CM | POA: Diagnosis not present

## 2024-05-20 DIAGNOSIS — K56609 Unspecified intestinal obstruction, unspecified as to partial versus complete obstruction: Secondary | ICD-10-CM | POA: Diagnosis not present

## 2024-05-20 DIAGNOSIS — K3589 Other acute appendicitis without perforation or gangrene: Secondary | ICD-10-CM | POA: Diagnosis not present

## 2024-05-20 DIAGNOSIS — E66813 Obesity, class 3: Secondary | ICD-10-CM | POA: Diagnosis not present

## 2024-05-20 LAB — GLUCOSE, CAPILLARY
Glucose-Capillary: 107 mg/dL — ABNORMAL HIGH (ref 70–99)
Glucose-Capillary: 138 mg/dL — ABNORMAL HIGH (ref 70–99)
Glucose-Capillary: 104 mg/dL — ABNORMAL HIGH (ref 70–99)
Glucose-Capillary: 106 mg/dL — ABNORMAL HIGH (ref 70–99)

## 2024-05-20 MED ORDER — HYDROMORPHONE HCL 1 MG/ML IJ SOLN
1.0000 mg | Freq: Two times a day (BID) | INTRAMUSCULAR | Status: DC | PRN
  Administered 2024-05-21: 1 mg via INTRAVENOUS
  Filled 2024-05-20: qty 1

## 2024-05-20 NOTE — Progress Notes (Signed)
 Pt requested not to be disturbed while she was asleep. Morning v/s were not obtained.

## 2024-05-20 NOTE — Progress Notes (Signed)
 [facility]  Eating and Bms. Still needing some dilaudid . Told her to use the roxicodone .  BP 133/76 (BP Location: Right Arm)   Pulse 87   Temp 98.6 F (37 C) (Oral)   Resp 19   Ht 5' 3 (1.6 m)   Wt (!) 155.4 kg   LMP  (LMP Unknown)   SpO2 94%   BMI 60.69 kg/m  Soft, staples c/d/I and honeycomb with dry blood inferiorly   Patient who was admitted with acute appendicitis. She is status post robotic assisted laparoscopic appendectomy on 8/31 for acute perforated appendicitis. She subsequently developed an incarcerated umbilical hernia causing small bowel obstruction on 9/3. She is status post open primary ventral hernia repair on 9/3.    Soft diet Continue to use roxicodone  and avoid dilaudid  Dilaudid  extended out to every 12 hours PRN   Manuelita Pander, MD [facility] [address] [place], [place] [address] [phone] (office)

## 2024-05-20 NOTE — Plan of Care (Signed)
  Problem: Education: Goal: Knowledge of General Education information will improve Description: Including pain rating scale, medication(s)/side effects and non-pharmacologic comfort measures Outcome: Progressing   Problem: Health Behavior/Discharge Planning: Goal: Ability to manage health-related needs will improve 05/20/2024 0532 by German Hollering, LPN Outcome: Progressing 05/20/2024 0531 by German Hollering, LPN Outcome: Progressing   Problem: Clinical Measurements: Goal: Ability to maintain clinical measurements within normal limits will improve 05/20/2024 0532 by German Hollering, LPN Outcome: Progressing 05/20/2024 0531 by German Hollering, LPN Outcome: Progressing Goal: Will remain free from infection 05/20/2024 0532 by German Hollering, LPN Outcome: Progressing 05/20/2024 0531 by German Hollering, LPN Outcome: Progressing Goal: Diagnostic test results will improve 05/20/2024 0532 by German Hollering, LPN Outcome: Progressing 05/20/2024 0531 by German Hollering, LPN Outcome: Progressing   Problem: Activity: Goal: Risk for activity intolerance will decrease Outcome: Progressing   Problem: Coping: Goal: Level of anxiety will decrease Outcome: Progressing   Problem: Elimination: Goal: Will not experience complications related to bowel motility Outcome: Progressing Goal: Will not experience complications related to urinary retention Outcome: Progressing   Problem: Pain Managment: Goal: General experience of comfort will improve and/or be controlled Outcome: Progressing   Problem: Safety: Goal: Ability to remain free from injury will improve Outcome: Progressing   Problem: Skin Integrity: Goal: Risk for impaired skin integrity will decrease Outcome: Progressing   Problem: Health Behavior/Discharge Planning: Goal: Ability to manage health-related needs will improve Outcome: Progressing

## 2024-05-20 NOTE — Plan of Care (Signed)

## 2024-05-20 NOTE — Progress Notes (Signed)
 PROGRESS NOTE  Karen Lewis FMW:[id number] DOB: June 12, 1971 DOA: 05/12/2024 PCP: [name], MD  Brief History:  53 year old female with PCOS, morbid obesity status post Roux-en-Y gastric bypass and laparoscopic gastric sleeve, fibromyalgia, chronic pain, opioid dependence, sleep apnea not on CPAP, GAD, degenerative arthritis of knees and shoulders, asthma, type 2 diabetes mellitus, GERD, gonalgia, hypothyroidism, HSV, difficult intubation, cephalgia who reports that she was treated for UTI about 5 days ago with a course of Augmentin .  About 1 week ago she was having symptoms of right lower quadrant abdominal pain and thought it was related to the UTI versus muscle strain.  She started having fever overnight.  Her temperature was reportedly 101.4 at home.  Patient was evaluated in the emergency department 8/30 and CT scanning demonstrates acute appendicitis.  She has been evaluated by the surgical team and they are planning to take her to the OR later today.  She is being admitted to the stepdown ICU bed due to history of difficult intubation, multiple medical comorbidities and need for close postoperative monitoring.   She was continued on zosyn  postop.  Her post op course was complicated by post op ileus.  Unfortunately, she developed nausea and vomiting on 05/16/2024.  Repeat CT of the abdomen and pelvis suggested bowel obstruction and her umbilical hernia.  The patient will be taken back to the OR by [name]   Assessment/Plan:  Acute appendicitis -05/12/24--robotic appendectomy -continue IV zosyn --finished 5 days -initially on IV dilaudid  post-op   Post op Ileus>>Bowel obstruction -9/3 CT AP with loop of bowel in umbilical hernia -appreciate [name] -05/16/24-- open ventral hernia repair -NG decompression till passing flatus -restart IVF -optimize electrolytes -05/18/24 afternoon--had small BM, passing flatus -increase ambulation -remove foley 9/5 -9/5 NG  removed -9/5 clear liquids>>full liquids for dinner -9/6--advanced to soft diet -9/7--tolerating soft diet, decrease IV dilaudid  frequency, encourage use of oxycodone ;  ambulating    CKD 3A -baseline creatinine 1.0-1.3 -monitor BMP   Diabetes Mellitus with hyperglycemia -05/12/24 A1C--6.6 -novolog  sliding scale -pt states she continues on ozempic, last dose 05/04/24   Opioid Dependence/Chronic pain syndrome -PDMP reviewed -oxycodone  5 mg, #90, last fill 03/15/24 -pregabalin  225mg , #180, last fill 04/24/24   Asthma, mod persistent, hx of eosinophilia -failed allergy shots -follows pulmonary  -continue singulair , Breo, albuterol  prn -she is on dupixent   Morbid obesity, s/p gastric bypass surgery (sleeve). S/p redo  ( S/P Lap RYGBP from sleeve gastrectomy)  -BMI 60.69 -lifestyle modifications   Anxiety/Depression -continue clonazepam  -PDMP shows last fill 0.5 mg, #15, 04/17/24 -continue quetiapine    History of  Mycobacterium abscessus infection  --soft tissue of the abdominal wall following lipectomy  -follow up [name]   Hypothyroidism -continue synthroid    GERD -continue PPI   Mixed HLD -continue statin once able to tolerate po   Chronic lower extremity swelling/lymphedema. -Moved from [place]  years ago.  Previously worked as a Engineer, civil (consulting).   Hypomagnesemia -repleted       Family Communication: family at bedside 9/7   Consultants:  general surgery   Code Status:  FULL    DVT Prophylaxis:  [place] Lovenox      Procedures: As Listed in Progress Note Above   Antibiotics: Zosyn  8/30>>9/4             Subjective: Patient denies fevers, chills, headache, chest pain, dyspnea, nausea, vomiting, diarrhea, dysuria, hematuria, hematochezia, and melena.  Conplains of incisional pain   Objective: Vitals:  05/19/24 1948 05/20/24 0808 05/20/24 0815 05/20/24 1428  BP: 128/86 133/76  121/79  Pulse: 86 87  80  Resp: 16 19  18   Temp: 98.1 F (36.7 C) 98.6 F  (37 C)  98.5 F (36.9 C)  TempSrc: Oral Oral    SpO2: 98%  94% 95%  Weight:      Height:        Intake/Output Summary (Last 24 hours) at 05/20/2024 1750 Last data filed at 05/20/2024 0830 Gross per 24 hour  Intake 540 ml  Output --  Net 540 ml   Weight change:  Exam:  General:  Pt is alert, follows commands appropriately, not in acute distress HEENT: No icterus, No thrush, No neck mass, /AT Cardiovascular: RRR, S1/S2, no rubs, no gallops Respiratory: CTA bilaterally, no wheezing, no crackles, no rhonchi Abdomen: Soft/+BS, incision tender, non distended, no guarding Extremities: No edema, No lymphangitis, No petechiae, No rashes, no synovitis   Data Reviewed: I have personally reviewed following labs and imaging studies Basic Metabolic Panel: Recent Labs  Lab 05/14/24 0402 05/15/24 0433 05/16/24 0429 05/17/24 0459 05/18/24 0401 05/19/24 0425  NA 134* 135 136 137 140 139  K 4.1 4.5 4.7 3.8 4.2 3.9  CL 102 104 101 103 105 107  CO2 23 23 26 23 22 23   GLUCOSE 244* 160* 140* 129* 120* 113*  BUN 19 17 16 11 10 9   CREATININE 1.28* 1.36* 1.22* 0.97 0.88 0.86  CALCIUM  7.2* 7.2* 7.8* 7.8* 8.2* 8.2*  MG 2.1  --  3.0* 2.6* 2.5* 2.2  PHOS  --   --  <1.0* 4.6 4.2 4.1   Liver Function Tests: Recent Labs  Lab 05/15/24 0433 05/16/24 0429  ALBUMIN 2.7* 3.0*   No results for input(s): LIPASE, AMYLASE in the last 168 hours. No results for input(s): AMMONIA in the last 168 hours. Coagulation Profile: No results for input(s): INR, PROTIME in the last 168 hours. CBC: Recent Labs  Lab 05/14/24 0402 05/15/24 0433 05/16/24 0429 05/17/24 0459 05/18/24 0631 05/19/24 0425  WBC 11.2* 11.0* 10.0 9.9 10.2 8.4  NEUTROABS 9.4*  --  7.8*  --   --   --   HGB 10.5* 11.4* 11.6* 10.9* 10.9* 11.4*  HCT 33.7* 36.2 36.9 33.7* 34.7* 36.9  MCV 92.8 92.8 92.3 90.8 93.0 92.7  PLT 209 265 299 315 309 343   Cardiac Enzymes: No results for input(s): CKTOTAL, CKMB, CKMBINDEX,  TROPONINI in the last 168 hours. BNP: Invalid input(s): POCBNP CBG: Recent Labs  Lab 05/19/24 1626 05/19/24 2122 05/20/24 0748 05/20/24 1130 05/20/24 1625  GLUCAP 129* 102* 107* 138* 104*   HbA1C: No results for input(s): HGBA1C in the last 72 hours. Urine analysis:    Component Value Date/Time   COLORURINE YELLOW 05/12/2024 1151   APPEARANCEUR CLEAR 05/12/2024 1151   APPEARANCEUR Hazy (A) 03/14/2023 1309   LABSPEC 1.012 05/12/2024 1151   LABSPEC 1.003 03/25/2013 2018   PHURINE 7.0 05/12/2024 1151   GLUCOSEU NEGATIVE 05/12/2024 1151   GLUCOSEU Negative 03/25/2013 2018   HGBUR NEGATIVE 05/12/2024 1151   BILIRUBINUR NEGATIVE 05/12/2024 1151   BILIRUBINUR Negative 03/14/2023 1309   BILIRUBINUR Negative 03/25/2013 2018   KETONESUR NEGATIVE 05/12/2024 1151   PROTEINUR NEGATIVE 05/12/2024 1151   UROBILINOGEN 0.2 08/04/2020 1345   UROBILINOGEN 0.2 05/09/2015 0115   NITRITE NEGATIVE 05/12/2024 1151   LEUKOCYTESUR NEGATIVE 05/12/2024 1151   LEUKOCYTESUR 2+ 03/25/2013 2018   Sepsis Labs: @LABRCNTIP (procalcitonin:4,lacticidven:4) ) Recent Results (from the past 240 hours)  Blood  culture (routine x 2)     Status: None   Collection Time: 05/12/24 12:57 PM   Specimen: Right Antecubital; Blood  Result Value Ref Range Status   Specimen Description   Final    RIGHT ANTECUBITAL BOTTLES DRAWN AEROBIC AND ANAEROBIC   Special Requests Blood Culture adequate volume  Final   Culture   Final    NO GROWTH 5 DAYS Performed at [facility], [address] [place], [place] [address]    Report Status 05/17/2024 FINAL  Final  MRSA Next Gen by PCR, Nasal     Status: None   Collection Time: 05/16/24 10:00 AM   Specimen: Nasal Mucosa; Nasal Swab  Result Value Ref Range Status   MRSA by PCR Next Gen NOT DETECTED NOT DETECTED Final    Comment: (NOTE) The GeneXpert MRSA Assay (FDA approved for NASAL specimens only), is one component of a comprehensive MRSA colonization  surveillance program. It is not intended to diagnose MRSA infection nor to guide or monitor treatment for MRSA infections. Test performance is not FDA approved in patients less than 40 years old. Performed at [facility], [address] [place], [place] [address]      Scheduled Meds:  acetaminophen   1,000 mg Oral Q6H   amitriptyline   100 mg Oral QHS   bisacodyl   10 mg Rectal Daily   Chlorhexidine  Gluconate Cloth  6 each Topical Daily   DULoxetine   60 mg Oral Daily   enoxaparin  (LOVENOX ) injection  80 mg Subcutaneous Q24H   estradiol   0.05 mg Transdermal Once per day on Monday Thursday   famotidine   20 mg Oral BID   fluticasone  furoate-vilanterol  1 puff Inhalation Daily   insulin  aspart  0-5 Units Subcutaneous QHS   insulin  aspart  0-9 Units Subcutaneous TID WC   levothyroxine   125 mcg Oral q morning   metoprolol  tartrate  25 mg Oral BID   montelukast   10 mg Oral QHS   nystatin    Topical BID   pantoprazole   40 mg Oral BID   pregabalin   225 mg Oral BID   progesterone   100 mg Oral Daily   QUEtiapine   50 mg Oral QHS   topiramate   100 mg Oral BID   Continuous Infusions:  Procedures/Studies: DG Abd Portable 1V Result Date: 05/16/2024 CLINICAL DATA:  NG placement. EXAM: PORTABLE ABDOMEN - 1 VIEW COMPARISON:  Earlier radiograph dated 05/16/2024. FINDINGS: Enteric tube with tip and side-port in the left upper abdomen likely in the proximal stomach. Dilated small bowel loops measure up to 5.5 cm. No free air identified. An IVC filter and bilateral hip arthroplasties noted. Degenerative changes of the spine. No acute osseous pathology. Anterior pelvic cutaneous staples. IMPRESSION: 1. Enteric tube with tip and side-port in the proximal stomach. 2. Dilated small bowel loops. Electronically Signed   By: Vanetta Chou M.D.   On: 05/16/2024 20:10   DG Abd 1 View Result Date: 05/16/2024 CLINICAL DATA:  NG tube placement. EXAM: ABDOMEN - 1 VIEW COMPARISON:  Earlier today. FINDINGS: The enteric  tube is been retracted. The side port is in the distal esophagus, the tip is below the diaphragm. Unchanged dilated bowel loops in the upper abdomen. IMPRESSION: Enteric tube has been retracted. The side port is in the distal esophagus, the tip is below the diaphragm. Recommend advancement of at least 6-1/2 cm to place the side-port below the diaphragm. Electronically Signed   By: Andrea Gasman M.D.   On: 05/16/2024 18:26   DG Abd Portable  1V Result Date: 05/16/2024 CLINICAL DATA:  NG tube placement. EXAM: PORTABLE ABDOMEN - 1 VIEW COMPARISON:  CT earlier today FINDINGS: Tip and side port of the enteric tube below the diaphragm in the stomach. Gaseous bowel loops in the upper abdomen again seen. Skin staples in the left upper quadrant. IMPRESSION: Tip and side port of the enteric tube below the diaphragm in the stomach. Electronically Signed   By: Andrea Gasman M.D.   On: 05/16/2024 18:25   CT ABDOMEN PELVIS W CONTRAST Result Date: 05/16/2024 CLINICAL DATA:  Postoperative pain. Ileus suspected. Recent appendectomy 05/12/2024 EXAM: CT ABDOMEN AND PELVIS WITH CONTRAST TECHNIQUE: Multidetector CT imaging of the abdomen and pelvis was performed using the standard protocol following bolus administration of intravenous contrast. RADIATION DOSE REDUCTION: This exam was performed according to the departmental dose-optimization program which includes automated exposure control, adjustment of the mA and/or kV according to patient size and/or use of iterative reconstruction technique. CONTRAST:  OMNIPAQUE  IOHEXOL  300 MG/ML  SOLN COMPARISON:  CT 05/12/2024 FINDINGS: Lower chest: Lung bases are clear. Hepatobiliary: No focal hepatic lesion. Postcholecystectomy. No biliary dilatation. Pancreas: Pancreas is normal. No ductal dilatation. No pancreatic inflammation. Spleen: Normal spleen Adrenals/urinary tract: Adrenal glands and kidneys are normal. The ureters and bladder normal. Small mm take a shin in bladder  evaluation related to bilateral hip prosthetics. Stomach/Bowel: There is fluid within distal esophagus. Post gastric bypass anatomy. Proximal and distal loops of small bowel fluid-filled. The distal small bowel is decompressed leading up to the terminal ileum. There is a midline ventral hernia. A loop of small bowel enters and exits the hernia sac. The loop of bowel exiting the sac is decompressed consistent with small bowel obstruction associated this ventral hernia. The hernia sac measures 7.1 cm in diameter. The hernia sac is midline beneath the umbilicus. The hernia mouth is narrow at 1.7 cm. On comparison CT the hernia sac was filled with peritoneal fat. Mild inflammation in the RIGHT lower quadrant related to appendectomy. No fluid collections or abscess. The colon and rectosigmoid colon are normal. Vascular/Lymphatic: infrarenal IVC filter noted. Abdominal aorta normal. No adenopathy. Reproductive: Post hysterectomy.  Adnexa unremarkable Other: No free fluid. Musculoskeletal: No aggressive osseous lesion. IMPRESSION: 1. Findings consistent with small bowel obstruction. Mechanical obstruction related to a loop of small bowel entering and midline ventral hernia inferior to the umbilicus. 2. Post gastric bypass anatomy. 3. Mild RIGHT lower quadrant inflammatory stranding related appendectomy. No abnormal fluid collections. 4. IVC filter in place. These results will be called to the ordering clinician or representative by the Radiologist Assistant, and communication documented in the PACS or [employer]. Electronically Signed   By: Jackquline Boxer M.D.   On: 05/16/2024 17:51   DG Abd 1 View Result Date: 05/16/2024 CLINICAL DATA:  Ileus, hiatal hernia repair. EXAM: ABDOMEN - 1 VIEW COMPARISON:  None Available. FINDINGS: Gaseous distention of bowel appears toe predominantly involves small bowel loops. Minimal colonic gas noted. While this could reflect ileus, cannot exclude distal small bowel obstruction.  No organomegaly or free air. IMPRESSION: Bowel dilatation predominantly involving small bowel could reflect ileus or distal small bowel obstruction. Electronically Signed   By: Franky Crease M.D.   On: 05/16/2024 14:39   DG Abd 1 View Result Date: 05/15/2024 CLINICAL DATA:  Ileus. EXAM: ABDOMEN - 1 VIEW COMPARISON:  CT 05/12/2024 FINDINGS: Diffuse gaseous small bowel distension, progressed from prior CT. Small volume of formed stool in the right colon. IVC filter is visualized. Skin  staples in the left abdomen. IMPRESSION: Diffuse gaseous small bowel distension, progressed from prior CT, favoring ileus. Electronically Signed   By: Andrea Gasman M.D.   On: 05/15/2024 15:38   US  PELVIC COMPLETE W TRANSVAGINAL AND TORSION R/O Result Date: 05/12/2024 EXAM: US  Pelvis, Complete Transvaginal and Transabdominal without Doppler TECHNIQUE: Transabdominal and transvaginal pelvic duplex ultrasound using B-mode/gray scaled imaging without Doppler spectral analysis and color flow was obtained. COMPARISON: None provided CLINICAL HISTORY: Pelvic pain. Additional clinical history of acute appendicitis on CT from earlier today. FINDINGS: UTERUS: Small anteverted uterus measures 3.9 x 1.7 x 2.5 cm, uterus volume 9.4 cc. No uterine fibroids. ENDOMETRIAL STRIPE: Bilayer endometrial thickness 1 mm. No focal endometrial mass. Trace fluid in the fundal endometrial cavity. RIGHT OVARY: Right ovary measures 2.6 x 1.2 x 1.2 cm and is normal with normal arterial and venous flow on color and spectral Doppler. No adnexal mass. LEFT OVARY: Left ovary not discretely visualized. No adnexal mass. FREE FLUID: No significant free fluid in the pelvic cul-de-sac. IMPRESSION: 1. No acute findings. 2. Normal right ovary. Nonvisualization of left ovary. No adnexal masses. 3. Small anteverted uterus with trace fluid in the fundal endometrial cavity. Normal endometrial thickness. Electronically signed by: Selinda Blue MD 05/12/2024 03:08 PM EDT RP  Workstation: [id number]   CT ABDOMEN PELVIS W CONTRAST Result Date: 05/12/2024 EXAM: CT ABDOMEN AND PELVIS WITH CONTRAST 05/12/2024 01:20:37 PM TECHNIQUE: CT of the abdomen and pelvis was performed with the administration of 100 mL of intravenous iohexol  (OMNIPAQUE ) 300 MG/ML solution. Multiplanar reformatted images are provided for review. Automated exposure control, iterative reconstruction, and/or weight-based adjustment of the mA/kV was utilized to reduce the radiation dose to as low as reasonably achievable. COMPARISON: 04/28/2021 CLINICAL HISTORY: Abdominal pain, acute, nonlocalized; right lower abdominal pain that radiates to right flank. Patient states she is currently being treated for a UTI and started a fever last night of 101.4. Patient states folding up a scooter and was worried about pulling a muscle. FINDINGS: LOWER CHEST: Moderate coronary calcification. Mild   aortic plaque. LIVER: Normal size and contour. GALLBLADDER AND BILE DUCTS: Gallbladder not identified. SPLEEN: Normal size. No focal lesion. PANCREAS: No mass. No ductal dilatation. ADRENAL GLANDS: Normal appearance. No mass. KIDNEYS, URETERS AND BLADDER: No stones in the kidneys or ureters. No hydronephrosis. No perinephric or periureteral stranding. Urinary bladder is unremarkable. GI AND BOWEL: Post gastric bypass surgery. Dilated fluid-filled appendix containing 2 calcified appendicoliths, with some mucosal enhancement. No definite perforation or abscess. Significant regional inflammatory changes however. Stomach demonstrates no acute abnormality. There is no bowel obstruction. No bowel wall thickening. PERITONEUM AND RETROPERITONEUM: No ascites. No free air. VASCULATURE: Infrarenal IVC filter with caval wall perforation by several legs, no definite adjacent organ involvement. Aorta is normal in caliber. LYMPH NODES: No lymphadenopathy. REPRODUCTIVE ORGANS: No significant abnormality. BONES AND SOFT TISSUES: bilateral hip arthroplasty.  Moderate umbilical hernia containing mesenteric fat. Vertebral endplate spurring at multiple levels in the lower thoracic spine. Mild spondylitic changes in the lumbar spine. No acute osseous abnormality. No focal soft tissue abnormality. IMPRESSION: 1. Acute appendicitis. No definite perforation or abscess. Findings telephoned to Dr. Melvenia at time of interpretation. 2. Moderate umbilical hernia containing mesenteric fat. 3. Infrarenal IVC filter with caval wall perforation by several legs, without definite adjacent organ involvement. Electronically signed by: Dayne Hassell MD 05/12/2024 01:50 PM EDT RP Workstation: [id number]    Alm Schneider, DO  [facility]  If 7PM-7AM, please contact night-coverage [email or web address] Password [email or web address] 05/20/2024, 5:50  PM   LOS: 7 days

## 2024-05-21 DIAGNOSIS — K3589 Other acute appendicitis without perforation or gangrene: Secondary | ICD-10-CM | POA: Diagnosis not present

## 2024-05-21 DIAGNOSIS — K56609 Unspecified intestinal obstruction, unspecified as to partial versus complete obstruction: Secondary | ICD-10-CM | POA: Diagnosis not present

## 2024-05-21 DIAGNOSIS — E66813 Obesity, class 3: Secondary | ICD-10-CM | POA: Diagnosis not present

## 2024-05-21 DIAGNOSIS — Z6841 Body Mass Index (BMI) 40.0 and over, adult: Secondary | ICD-10-CM | POA: Diagnosis not present

## 2024-05-21 LAB — CBC
HCT: 38 % (ref 36.0–46.0)
Hemoglobin: 11.9 g/dL — ABNORMAL LOW (ref 12.0–15.0)
MCH: 29.3 pg (ref 26.0–34.0)
MCHC: 31.3 g/dL (ref 30.0–36.0)
MCV: 93.6 fL (ref 80.0–100.0)
Platelets: 444 K/uL — ABNORMAL HIGH (ref 150–400)
RBC: 4.06 MIL/uL (ref 3.87–5.11)
RDW: 13.2 % (ref 11.5–15.5)
WBC: 11.4 K/uL — ABNORMAL HIGH (ref 4.0–10.5)
nRBC: 0 % (ref 0.0–0.2)

## 2024-05-21 LAB — BASIC METABOLIC PANEL WITH GFR
Anion gap: 12 (ref 5–15)
BUN: 10 mg/dL (ref 6–20)
CO2: 18 mmol/L — ABNORMAL LOW (ref 22–32)
Calcium: 7.6 mg/dL — ABNORMAL LOW (ref 8.9–10.3)
Chloride: 107 mmol/L (ref 98–111)
Creatinine, Ser: 0.83 mg/dL (ref 0.44–1.00)
GFR, Estimated: >60 mL/min (ref >60)
Glucose, Bld: 162 mg/dL — ABNORMAL HIGH (ref 70–99)
Potassium: 3.9 mmol/L (ref 3.5–5.1)
Sodium: 137 mmol/L (ref 135–145)

## 2024-05-21 LAB — GLUCOSE, CAPILLARY
Glucose-Capillary: 105 mg/dL — ABNORMAL HIGH (ref 70–99)
Glucose-Capillary: 178 mg/dL — ABNORMAL HIGH (ref 70–99)

## 2024-05-21 LAB — MAGNESIUM: Magnesium: 2.2 mg/dL (ref 1.7–2.4)

## 2024-05-21 MED ORDER — OXYCODONE HCL 5 MG PO TABS: 5.0000 mg | ORAL_TABLET | Freq: Four times a day (QID) | ORAL | 0 refills | Status: DC | PRN

## 2024-05-21 MED ORDER — METOPROLOL TARTRATE 25 MG PO TABS: 25.0000 mg | ORAL_TABLET | Freq: Two times a day (BID) | ORAL | 1 refills | Status: AC

## 2024-05-21 MED ORDER — KETOROLAC TROMETHAMINE 10 MG PO TABS: 10.0000 mg | ORAL_TABLET | Freq: Four times a day (QID) | ORAL | 0 refills | Status: AC | PRN

## 2024-05-21 NOTE — Discharge Summary (Signed)
 Physician Discharge Summary   Patient: Karen Lewis MRN: [id number] DOB: 02-19-1971  Admit date:     05/12/2024  Discharge date: 05/21/24  Discharge Physician: Karen Lewis   PCP: Karen Reyes BIRCH, MD   Recommendations at discharge:   Please follow up with primary care provider within 1-2 weeks  Please repeat BMP and CBC in one week      Hospital Course: 53 year old female with PCOS, morbid obesity status post Roux-en-Y gastric bypass and laparoscopic gastric sleeve, fibromyalgia, chronic pain, opioid dependence, sleep apnea not on CPAP, GAD, degenerative arthritis of knees and shoulders, asthma, type 2 diabetes mellitus, GERD, gonalgia, hypothyroidism, HSV, difficult intubation, cephalgia who reports that she was treated for UTI about 5 days ago with a course of Augmentin .  About 1 week ago she was having symptoms of right lower quadrant abdominal pain and thought it was related to the UTI versus muscle strain.  She started having fever overnight.  Her temperature was reportedly 101.4 at home.  Patient was evaluated in the emergency department 8/30 and CT scanning demonstrates acute appendicitis.  She has been evaluated by the surgical team and they are planning to take her to the OR later today.  She is being admitted to the stepdown ICU bed due to history of difficult intubation, multiple medical comorbidities and need for close postoperative monitoring.   She was continued on zosyn  postop.  Her post op course was complicated by post op ileus.  Unfortunately, she developed nausea and vomiting on 05/16/2024.  Repeat CT of the abdomen and pelvis suggested bowel obstruction and her umbilical hernia.  The patient will be taken back to the OR by [name]  Assessment and Plan: Acute appendicitis -05/12/24--robotic appendectomy -continue IV zosyn --finished 5 days -initially on IV dilaudid  post-op   Post op Ileus>>Bowel obstruction -9/3 CT AP with loop of bowel in umbilical hernia -appreciate  [name] -05/16/24-- open ventral hernia repair -NG decompression till passing flatus -restart IVF -optimize electrolytes -05/18/24 afternoon--had small BM, passing flatus -increase ambulation -remove foley 9/5 -9/5 NG removed -9/5 clear liquids>>full liquids for dinner -9/6--advanced to soft diet -9/7--tolerating soft diet, decrease IV dilaudid  frequency, encourage use of oxycodone ;  ambulating  -9/8--discussed with surgery, [name] of d/c   CKD 3A -baseline creatinine 1.0-1.3 -now better than prior baseline   Diabetes Mellitus with hyperglycemia -05/12/24 A1C--6.6 -novolog  sliding scale -pt states she continues on ozempic, last dose 05/04/24   Opioid Dependence/Chronic pain syndrome -PDMP reviewed -oxycodone  5 mg, #90, last fill 03/15/24 -pregabalin  225mg , #180, last fill 04/24/24   Asthma, mod persistent, hx of eosinophilia -failed allergy shots -follows pulmonary  -continue singulair , Breo, albuterol  prn -she is on dupixent -stable on RA   Morbid obesity, s/p gastric bypass surgery (sleeve). S/p redo  ( S/P Lap RYGBP from sleeve gastrectomy)  -BMI 60.69 -lifestyle modifications   Anxiety/Depression -continue clonazepam  -PDMP shows last fill 0.5 mg, #15, 04/17/24 -continue quetiapine    History of  Mycobacterium abscessus infection  --soft tissue of the abdominal wall following lipectomy  -follow up Dr. Fredia Lewis   Hypothyroidism -continue synthroid    GERD -continue PPI   Mixed HLD -continue statin once able to tolerate po   Chronic lower extremity swelling/lymphedema. -Moved from [place]  years ago.  Previously worked as a Engineer, civil (consulting).   Hypomagnesemia -repleted         Consultants: general surgery Procedures performed: appendectomy, ventral hernia repair  Disposition: Home Diet recommendation:  Cardiac and Carb modified diet DISCHARGE MEDICATION: Allergies as  of 05/21/2024       Reactions   Bactrim [sulfamethoxazole-trimethoprim] Hives    Omalizumab Itching, Hives   Ciprofloxacin Other (See Comments)   myalgia   Shellfish Allergy Other (See Comments)   + positive allergy test   Nsaids Nausea Only, Other (See Comments)   Stomach upset        Medication List     STOP taking these medications    acetaminophen  650 MG CR tablet Commonly known as: TYLENOL  Replaced by: acetaminophen  500 MG tablet   amoxicillin -clavulanate 875-125 MG tablet Commonly known as: AUGMENTIN    cephALEXin  500 MG capsule Commonly known as: KEFLEX    fluconazole  150 MG tablet Commonly known as: DIFLUCAN    nitrofurantoin  (macrocrystal-monohydrate) 100 MG capsule Commonly known as: MACROBID        TAKE these medications    acetaminophen  500 MG tablet Commonly known as: TYLENOL  Take 2 tablets (1,000 mg total) by mouth every 6 (six) hours for 7 days. Replaces: acetaminophen  650 MG CR tablet   acidophilus Caps capsule Take 1 capsule by mouth daily.   albuterol  108 (90 Base) MCG/ACT inhaler Commonly known as: VENTOLIN  HFA Inhale 2 puffs into the lungs every 6 (six) hours as needed for wheezing or shortness of breath.   albuterol  (2.5 MG/3ML) 0.083% nebulizer solution Commonly known as: PROVENTIL  Take 2.5 mg by nebulization every 6 (six) hours as needed for wheezing or shortness of breath.   amitriptyline  100 MG tablet Commonly known as: ELAVIL  Take 100 mg by mouth at bedtime.   butalbital -acetaminophen -caffeine  50-325-40 MG tablet Commonly known as: FIORICET  Take 1 tablet by mouth every 6 (six) hours as needed for migraine.   calcium  carbonate 750 MG chewable tablet Commonly known as: TUMS EX Chew 1 tablet by mouth daily.   clonazePAM  0.5 MG tablet Commonly known as: KLONOPIN  Take 0.5 mg by mouth 2 (two) times daily as needed for anxiety.   cyanocobalamin  1000 MCG tablet Commonly known as: VITAMIN B12 Take 1,000 mcg by mouth 2 (two) times a week. Mondays and Fridays   diclofenac  sodium 1 % Gel Commonly known as:  Voltaren  Apply 2 g topically 4 (four) times daily. What changed:  when to take this reasons to take this   diphenhydrAMINE  25 MG tablet Commonly known as: BENADRYL  Take 25 mg by mouth every 8 (eight) hours as needed for itching or allergies.   DULoxetine  60 MG capsule Commonly known as: CYMBALTA  Take 60 mg by mouth daily.   Dupilumab 300 MG/2ML Soaj Inject 300 mg into the skin every 14 (fourteen) days. 15th and 30th of every month   Emgality 120 MG/ML Soaj Generic drug: Galcanezumab-gnlm Inject 120 mg into the skin every 30 (thirty) days. 30th of each month   EPINEPHrine  0.3 mg/0.3 mL Soaj injection Commonly known as: EPI-PEN Inject 0.3 mg into the muscle as needed for anaphylaxis.   ergocalciferol  1.25 MG (50000 UT) capsule Commonly known as: VITAMIN D2 Take 50,000 Units by mouth 2 (two) times a week. Mondays and Fridays   estradiol  0.05 mg/24hr patch Commonly known as: CLIMARA  - Dosed in mg/24 hr Place 0.05 mg onto the skin 2 (two) times a week. Mondays and Fridays   estradiol  0.1 MG/GM vaginal cream Commonly known as: ESTRACE  Place 1 g vaginally.   famotidine  20 MG tablet Commonly known as: PEPCID  Take 20 mg by mouth 2 (two) times daily.   fluticasone  furoate-vilanterol 200-25 MCG/ACT Aepb Commonly known as: BREO ELLIPTA  Inhale 1 puff into the lungs daily.   hydrochlorothiazide 25 MG  tablet Commonly known as: HYDRODIURIL Take 25 mg by mouth daily.   ketorolac  10 MG tablet Commonly known as: TORADOL  Take 1 tablet (10 mg total) by mouth every 6 (six) hours as needed.   levocetirizine 5 MG tablet Commonly known as: XYZAL  Take 5 mg by mouth at bedtime.   levothyroxine  125 MCG tablet Commonly known as: SYNTHROID  Take 125-137.5 mcg by mouth every morning. 125mcg on all days except Sundays. Take 137.5 on Sundays only   lisinopril  5 MG tablet Commonly known as: ZESTRIL  Take 5 mg by mouth at bedtime.   magnesium  oxide 400 MG tablet Commonly known as:  MAG-OX Take 1,200 mg by mouth 2 (two) times daily. Take 1200 mg by mouth in the morning and 1200 mg at bedtime   meclizine  25 MG tablet Commonly known as: ANTIVERT  Take 25 mg by mouth 3 (three) times daily as needed for dizziness.   metFORMIN  500 MG 24 hr tablet Commonly known as: GLUCOPHAGE -XR Take 500 mg by mouth 2 (two) times daily.   methocarbamol  500 MG tablet Commonly known as: ROBAXIN  Take 1 tablet (500 mg total) by mouth 3 (three) times daily for 7 days.   metoprolol  tartrate 25 MG tablet Commonly known as: LOPRESSOR  Take 1 tablet (25 mg total) by mouth 2 (two) times daily. What changed:  medication strength how much to take additional instructions   montelukast  10 MG tablet Commonly known as: SINGULAIR  Take 10 mg by mouth at bedtime.   naloxone  4 MG/0.1ML Liqd nasal spray kit Commonly known as: NARCAN  Place 1 spray into the nose as needed for up to 365 doses (for opioid-induced respiratory depresssion). In case of emergency (overdose), spray once into each nostril. If no response within 3 minutes, repeat application and call 911.   naphazoline-pheniramine 0.025-0.3 % ophthalmic solution Commonly known as: NAPHCON-A Place 1 drop into both eyes 4 (four) times daily as needed for eye irritation.   nystatin  powder Commonly known as: MYCOSTATIN /NYSTOP  Apply 1 Application topically 2 (two) times daily as needed (for irritation).   omeprazole 40 MG capsule Commonly known as: PRILOSEC Take 40 mg by mouth in the morning and at bedtime.   oxyCODONE  5 MG immediate release tablet Commonly known as: Oxy IR/ROXICODONE  Take 1 tablet (5 mg total) by mouth every 8 (eight) hours as needed for severe pain (pain score 7-10). Must last 30 days What changed:  Another medication with the same name was changed. Make sure you understand how and when to take each. Another medication with the same name was removed. Continue taking this medication, and follow the directions you see  here.   oxyCODONE  5 MG immediate release tablet Commonly known as: Roxicodone  Take 1 tablet (5 mg total) by mouth every 6 (six) hours as needed for severe pain (pain score 7-10) or breakthrough pain. What changed:  when to take this reasons to take this additional instructions Another medication with the same name was removed. Continue taking this medication, and follow the directions you see here.   Ozempic (0.25 or 0.5 MG/DOSE) 2 MG/1.5ML Sopn Generic drug: Semaglutide(0.25 or 0.5MG /DOS) Inject 2 mg into the skin every Tuesday.   pregabalin  225 MG capsule Commonly known as: LYRICA  Take 225 mg by mouth 2 (two) times daily.   progesterone  100 MG capsule Commonly known as: PROMETRIUM  Take 100 mg by mouth at bedtime.   QUEtiapine  50 MG tablet Commonly known as: SEROQUEL  Take 50 mg by mouth at bedtime.   rizatriptan 10 MG tablet Commonly known as: MAXALT  Take 10 mg by mouth as needed for migraine. Take 1 and may repeat in 2 hours for MIgraines, alternate with the disintrgrating tablet.   rosuvastatin  40 MG tablet Commonly known as: CRESTOR  Take 40 mg by mouth daily.   Tab-A-Vite Tabs Take 2 tablets by mouth every morning.   tiZANidine  4 MG tablet Commonly known as: ZANAFLEX  Take 1 tablet (4 mg total) by mouth 2 (two) times daily as needed for muscle spasms.   topiramate  100 MG tablet Commonly known as: TOPAMAX  Take 100 mg by mouth 2 (two) times daily.   vitamin k 100 MCG tablet Take 100 mcg by mouth daily.        Follow-up Information     Pappayliou, Dorothyann LABOR, DO. Call.   Specialty: General Surgery Why: Call to schedule follow up with me in 2 weeks Contact information: [address] [place] [facility] [address] [phone]         [place], [facility]  Follow up.   Why: [facility] will follow up with you at discharge to provide home health physical therapy. Contact information: [address] [place]  [address] [phone]                Discharge Exam: Filed Weights   05/12/24 1124 05/12/24 1717 05/16/24 1503  Weight: (!) 152.9 kg (!) 155.4 kg (!) 155.4 kg   HEENT:  [place]/AT, No thrush, no icterus CV:  RRR, no rub, no S3, no S4 Lung:  CTA, no wheeze, no rhonchi Abd:  soft/+BS, NT Ext:  trace LE edema, no lymphangitis, no synovitis, no rash   Condition at discharge: stable  The results of significant diagnostics from this hospitalization (including imaging, microbiology, ancillary and laboratory) are listed below for reference.   Imaging Studies: DG Abd Portable 1V Result Date: 05/16/2024 CLINICAL DATA:  NG placement. EXAM: PORTABLE ABDOMEN - 1 VIEW COMPARISON:  Earlier radiograph dated 05/16/2024. FINDINGS: Enteric tube with tip and side-port in the left upper abdomen likely in the proximal stomach. Dilated small bowel loops measure up to 5.5 cm. No free air identified. An IVC filter and bilateral hip arthroplasties noted. Degenerative changes of the spine. No acute osseous pathology. Anterior pelvic cutaneous staples. IMPRESSION: 1. Enteric tube with tip and side-port in the proximal stomach. 2. Dilated small bowel loops. Electronically Signed   By: Vanetta Chou M.D.   On: 05/16/2024 20:10   DG Abd 1 View Result Date: 05/16/2024 CLINICAL DATA:  NG tube placement. EXAM: ABDOMEN - 1 VIEW COMPARISON:  Earlier today. FINDINGS: The enteric tube is been retracted. The side port is in the distal esophagus, the tip is below the diaphragm. Unchanged dilated bowel loops in the upper abdomen. IMPRESSION: Enteric tube has been retracted. The side port is in the distal esophagus, the tip is below the diaphragm. Recommend advancement of at least 6-1/2 cm to place the side-port below the diaphragm. Electronically Signed   By: Andrea Gasman M.D.   On: 05/16/2024 18:26   DG Abd Portable 1V Result Date: 05/16/2024 CLINICAL DATA:  NG tube placement. EXAM: PORTABLE ABDOMEN - 1 VIEW COMPARISON:  CT  earlier today FINDINGS: Tip and side port of the enteric tube below the diaphragm in the stomach. Gaseous bowel loops in the upper abdomen again seen. Skin staples in the left upper quadrant. IMPRESSION: Tip and side port of the enteric tube below the diaphragm in the stomach. Electronically Signed   By: Andrea Gasman M.D.   On: 05/16/2024 18:25  CT ABDOMEN PELVIS W CONTRAST Result Date: 05/16/2024 CLINICAL DATA:  Postoperative pain. Ileus suspected. Recent appendectomy 05/12/2024 EXAM: CT ABDOMEN AND PELVIS WITH CONTRAST TECHNIQUE: Multidetector CT imaging of the abdomen and pelvis was performed using the standard protocol following bolus administration of intravenous contrast. RADIATION DOSE REDUCTION: This exam was performed according to the departmental dose-optimization program which includes automated exposure control, adjustment of the mA and/or kV according to patient size and/or use of iterative reconstruction technique. CONTRAST:  OMNIPAQUE  IOHEXOL  300 MG/ML  SOLN COMPARISON:  CT 05/12/2024 FINDINGS: Lower chest: Lung bases are clear. Hepatobiliary: No focal hepatic lesion. Postcholecystectomy. No biliary dilatation. Pancreas: Pancreas is normal. No ductal dilatation. No pancreatic inflammation. Spleen: Normal spleen Adrenals/urinary tract: Adrenal glands and kidneys are normal. The ureters and bladder normal. Small mm take a shin in bladder evaluation related to bilateral hip prosthetics. Stomach/Bowel: There is fluid within distal esophagus. Post gastric bypass anatomy. Proximal and distal loops of small bowel fluid-filled. The distal small bowel is decompressed leading up to the terminal ileum. There is a midline ventral hernia. A loop of small bowel enters and exits the hernia sac. The loop of bowel exiting the sac is decompressed consistent with small bowel obstruction associated this ventral hernia. The hernia sac measures 7.1 cm in diameter. The hernia sac is midline beneath the  umbilicus. The hernia mouth is narrow at 1.7 cm. On comparison CT the hernia sac was filled with peritoneal fat. Mild inflammation in the RIGHT lower quadrant related to appendectomy. No fluid collections or abscess. The colon and rectosigmoid colon are normal. Vascular/Lymphatic: infrarenal IVC filter noted. Abdominal aorta normal. No adenopathy. Reproductive: Post hysterectomy.  Adnexa unremarkable Other: No free fluid. Musculoskeletal: No aggressive osseous lesion. IMPRESSION: 1. Findings consistent with small bowel obstruction. Mechanical obstruction related to a loop of small bowel entering and midline ventral hernia inferior to the umbilicus. 2. Post gastric bypass anatomy. 3. Mild RIGHT lower quadrant inflammatory stranding related appendectomy. No abnormal fluid collections. 4. IVC filter in place. These results will be called to the ordering clinician or representative by the Radiologist Assistant, and communication documented in the PACS or [employer]. Electronically Signed   By: Jackquline Boxer M.D.   On: 05/16/2024 17:51   DG Abd 1 View Result Date: 05/16/2024 CLINICAL DATA:  Ileus, hiatal hernia repair. EXAM: ABDOMEN - 1 VIEW COMPARISON:  None Available. FINDINGS: Gaseous distention of bowel appears toe predominantly involves small bowel loops. Minimal colonic gas noted. While this could reflect ileus, cannot exclude distal small bowel obstruction. No organomegaly or free air. IMPRESSION: Bowel dilatation predominantly involving small bowel could reflect ileus or distal small bowel obstruction. Electronically Signed   By: Franky Crease M.D.   On: 05/16/2024 14:39   DG Abd 1 View Result Date: 05/15/2024 CLINICAL DATA:  Ileus. EXAM: ABDOMEN - 1 VIEW COMPARISON:  CT 05/12/2024 FINDINGS: Diffuse gaseous small bowel distension, progressed from prior CT. Small volume of formed stool in the right colon. IVC filter is visualized. Skin staples in the left abdomen. IMPRESSION: Diffuse gaseous small  bowel distension, progressed from prior CT, favoring ileus. Electronically Signed   By: Andrea Gasman M.D.   On: 05/15/2024 15:38   US  PELVIC COMPLETE W TRANSVAGINAL AND TORSION R/O Result Date: 05/12/2024 EXAM: US  Pelvis, Complete Transvaginal and Transabdominal without Doppler TECHNIQUE: Transabdominal and transvaginal pelvic duplex ultrasound using B-mode/gray scaled imaging without Doppler spectral analysis and color flow was obtained. COMPARISON: None provided CLINICAL HISTORY: Pelvic pain. Additional clinical history  of acute appendicitis on CT from earlier today. FINDINGS: UTERUS: Small anteverted uterus measures 3.9 x 1.7 x 2.5 cm, uterus volume 9.4 cc. No uterine fibroids. ENDOMETRIAL STRIPE: Bilayer endometrial thickness 1 mm. No focal endometrial mass. Trace fluid in the fundal endometrial cavity. RIGHT OVARY: Right ovary measures 2.6 x 1.2 x 1.2 cm and is normal with normal arterial and venous flow on color and spectral Doppler. No adnexal mass. LEFT OVARY: Left ovary not discretely visualized. No adnexal mass. FREE FLUID: No significant free fluid in the pelvic cul-de-sac. IMPRESSION: 1. No acute findings. 2. Normal right ovary. Nonvisualization of left ovary. No adnexal masses. 3. Small anteverted uterus with trace fluid in the fundal endometrial cavity. Normal endometrial thickness. Electronically signed by: Selinda Blue MD 05/12/2024 03:08 PM EDT RP Workstation: [id number]   CT ABDOMEN PELVIS W CONTRAST Result Date: 05/12/2024 EXAM: CT ABDOMEN AND PELVIS WITH CONTRAST 05/12/2024 01:20:37 PM TECHNIQUE: CT of the abdomen and pelvis was performed with the administration of 100 mL of intravenous iohexol  (OMNIPAQUE ) 300 MG/ML solution. Multiplanar reformatted images are provided for review. Automated exposure control, iterative reconstruction, and/or weight-based adjustment of the mA/kV was utilized to reduce the radiation dose to as low as reasonably achievable. COMPARISON: 04/28/2021 CLINICAL  HISTORY: Abdominal pain, acute, nonlocalized; right lower abdominal pain that radiates to right flank. Patient states she is currently being treated for a UTI and started a fever last night of 101.4. Patient states folding up a scooter and was worried about pulling a muscle. FINDINGS: LOWER CHEST: Moderate coronary calcification. Mild   aortic plaque. LIVER: Normal size and contour. GALLBLADDER AND BILE DUCTS: Gallbladder not identified. SPLEEN: Normal size. No focal lesion. PANCREAS: No mass. No ductal dilatation. ADRENAL GLANDS: Normal appearance. No mass. KIDNEYS, URETERS AND BLADDER: No stones in the kidneys or ureters. No hydronephrosis. No perinephric or periureteral stranding. Urinary bladder is unremarkable. GI AND BOWEL: Post gastric bypass surgery. Dilated fluid-filled appendix containing 2 calcified appendicoliths, with some mucosal enhancement. No definite perforation or abscess. Significant regional inflammatory changes however. Stomach demonstrates no acute abnormality. There is no bowel obstruction. No bowel wall thickening. PERITONEUM AND RETROPERITONEUM: No ascites. No free air. VASCULATURE: Infrarenal IVC filter with caval wall perforation by several legs, no definite adjacent organ involvement. Aorta is normal in caliber. LYMPH NODES: No lymphadenopathy. REPRODUCTIVE ORGANS: No significant abnormality. BONES AND SOFT TISSUES: bilateral hip arthroplasty. Moderate umbilical hernia containing mesenteric fat. Vertebral endplate spurring at multiple levels in the lower thoracic spine. Mild spondylitic changes in the lumbar spine. No acute osseous abnormality. No focal soft tissue abnormality. IMPRESSION: 1. Acute appendicitis. No definite perforation or abscess. Findings telephoned to Dr. Melvenia at time of interpretation. 2. Moderate umbilical hernia containing mesenteric fat. 3. Infrarenal IVC filter with caval wall perforation by several legs, without definite adjacent organ involvement.  Electronically signed by: Dayne Hassell MD 05/12/2024 01:50 PM EDT RP Workstation: [id number]    Microbiology: Results for orders placed or performed during the hospital encounter of 05/12/24  Blood culture (routine x 2)     Status: None   Collection Time: 05/12/24 12:57 PM   Specimen: Right Antecubital; Blood  Result Value Ref Range Status   Specimen Description   Final    RIGHT ANTECUBITAL BOTTLES DRAWN AEROBIC AND ANAEROBIC   Special Requests Blood Culture adequate volume  Final   Culture   Final    NO GROWTH 5 DAYS Performed at [facility], [address] [place], [place] [address]    Report Status  05/17/2024 FINAL  Final  MRSA Next Gen by PCR, Nasal     Status: None   Collection Time: 05/16/24 10:00 AM   Specimen: Nasal Mucosa; Nasal Swab  Result Value Ref Range Status   MRSA by PCR Next Gen NOT DETECTED NOT DETECTED Final    Comment: (NOTE) The GeneXpert MRSA Assay (FDA approved for NASAL specimens only), is one component of a comprehensive MRSA colonization surveillance program. It is not intended to diagnose MRSA infection nor to guide or monitor treatment for MRSA infections. Test performance is not FDA approved in patients less than 63 years old. Performed at [facility], [address], [place], [place] [address]     Labs: CBC: Recent Labs  Lab 05/16/24 0429 05/17/24 0459 05/18/24 0631 05/19/24 0425 05/21/24 0926  WBC 10.0 9.9 10.2 8.4 11.4*  NEUTROABS 7.8*  --   --   --   --   HGB 11.6* 10.9* 10.9* 11.4* 11.9*  HCT 36.9 33.7* 34.7* 36.9 38.0  MCV 92.3 90.8 93.0 92.7 93.6  PLT 299 315 309 343 444*   Basic Metabolic Panel: Recent Labs  Lab 05/16/24 0429 05/17/24 0459 05/18/24 0401 05/19/24 0425 05/21/24 0926  NA 136 137 140 139 137  K 4.7 3.8 4.2 3.9 3.9  CL 101 103 105 107 107  CO2 26 23 22 23  18*  GLUCOSE 140* 129* 120* 113* 162*  BUN 16 11 10 9 10   CREATININE 1.22* 0.97 0.88 0.86 0.83  CALCIUM  7.8* 7.8* 8.2* 8.2* 7.6*  MG 3.0* 2.6* 2.5*  2.2 2.2  PHOS <1.0* 4.6 4.2 4.1  --    Liver Function Tests: Recent Labs  Lab 05/15/24 0433 05/16/24 0429  ALBUMIN 2.7* 3.0*   CBG: Recent Labs  Lab 05/20/24 1130 05/20/24 1625 05/20/24 2100 05/21/24 0723 05/21/24 1111  GLUCAP 138* 104* 106* 105* 178*    Discharge time spent: greater than 30 minutes.  Signed: Alm Schneider, MD [facility] 05/21/2024

## 2024-05-21 NOTE — Progress Notes (Signed)
 [facility]  Eating and having Bms.  She is on chronic roxicodone  5mg  q 8 hours and has been taking roxicodone  10mg  every 6-8 hours while here. She is nervous about going home and not having enough.  She is due to pick up her roxicodone  from her pain doctor. She reports with surgeries they allow her to get more medication and is not worried about her pain contract.  She does not tolerating ibuprofen, advil due to nausea and is s/p gastric bypass so she should take these sparingly anyway.  She asked for toradol  which she has taken in the past without issue. It is not on her formulary but I wrote for it and told her she may have to pay for it.   I have told her to take PPI and take it with food.   BP 115/74 (BP Location: Right Arm)   Pulse 81   Temp (!) 97.5 F (36.4 C) (Oral)   Resp 18   Ht 5' 3 (1.6 m)   Wt (!) 155.4 kg   LMP  (LMP Unknown)   SpO2 99%   BMI 60.69 kg/m  Soft, staples c/d/I with minor irritation at the staples but no extending erythema or drainage Appropriately tender    Patient s/p robotic appendectomy and primary umbilical hernia. Roxicodone  5mg  q 6 PRN (#15 sent in) Toradol  10mg  q 6 PRN   Updated team.  Future Appointments  Date Time Provider Department Center  05/28/2024 11:00 AM Gaston Hamilton, MD [facility] None  05/30/2024  1:00 PM Pappayliou, Dorothyann LABOR, DO [facility] None  06/19/2024  9:00 AM Tanya Glisson, MD [facility] None  06/26/2024 10:00 AM [facility] LAB [facility] None  06/26/2024  1:45 PM Babara Call, MD [facility] None  06/26/2024  2:15 PM [facility] INFUSION CHAIR 4 [facility] None  07/31/2024  1:00 PM Tobie Emmy POUR, NP [facility] None  11/14/2024  1:00 PM Delores Orvin BRAVO, NP [facility] None    Manuelita Pander, MD [facility] [address] [place] BRAVO [place], [place] [address] [phone] (office)

## 2024-05-21 NOTE — Progress Notes (Signed)
 Mobility Specialist Progress Note:    05/21/24 1055  Mobility  Activity Ambulated with assistance  Level of Assistance Modified independent, requires aide device or extra time  Assistive Device Front wheel walker  Distance Ambulated (ft) 200 ft  Range of Motion/Exercises Active;All extremities  Activity Response Tolerated well  Mobility Referral Yes  Mobility visit 1 Mobility  Mobility Specialist Start Time (ACUTE ONLY) 1055  Mobility Specialist Stop Time (ACUTE ONLY) 1115  Mobility Specialist Time Calculation (min) (ACUTE ONLY) 20 min   Pt received in chair, agreeable to mobility. ModI to stand and ambulate with RW. Tolerated well,asx throughout. Returned pt to chair, all needs met.  Jadd Gasior Mobility Specialist Please contact via Special educational needs teacher or  Rehab office at [phone]

## 2024-05-21 NOTE — TOC Transition Note (Signed)
 Transition of Care [facility]) - Discharge Note   Patient Details  Name: Karen Lewis MRN: [id number] Date of Birth: 17-Sep-1970  [facility] CM/SW Contact:  Hoy DELENA Bigness, LCSW Phone Number: 05/21/2024, 12:31 PM   Clinical Narrative:    HHPT arranged with [facility]. HH order in place. No further TOC needs identified. TOC signing off at this time.    Final next level of care: Home w Home Health Services Barriers to Discharge: No Barriers Identified   Patient Goals and CMS Choice Patient states their goals for this hospitalization and ongoing recovery are:: Return home CMS Medicare.gov Compare Post Acute Care list provided to:: Patient Choice offered to / list presented to : Patient      Discharge Placement                       Discharge Plan and Services Additional resources added to the After Visit Summary for   In-house Referral: Clinical Social Work Discharge Planning Services: CM Consult Post Acute Care Choice: NA          DME Arranged: N/A DME Agency: NA       HH Arranged: PT HH Agency: [facility] Date HH Agency Contacted: 05/21/24 Time HH Agency Contacted: 1231 Representative spoke with at [facility] Agency: Selinda  Social Drivers of Health (SDOH) Interventions SDOH Screenings   Food Insecurity: No Food Insecurity (05/12/2024)  Recent Concern: Food Insecurity - Food Insecurity Present (03/12/2024)   Received from [facility]  Housing: High Risk (05/12/2024)  Transportation Needs: No Transportation Needs (05/12/2024)  Recent Concern: Transportation Needs - Unmet Transportation Needs (03/12/2024)   Received from [facility]  Utilities: Not At Risk (05/12/2024)  Alcohol  Screen: Low Risk  (12/14/2019)  Depression (PHQ2-9): Low Risk  (04/30/2024)  Financial Resource Strain: Medium Risk (03/12/2024)   Received from [facility]  Physical Activity: Inactive (12/14/2019)  Social  Connections: Moderately Integrated (01/20/2022)  Stress: No Stress Concern Present (01/20/2022)  Tobacco Use: Low Risk  (05/16/2024)     Readmission Risk Interventions    05/21/2024   12:31 PM 05/13/2024    5:07 PM 08/09/2022   12:42 PM  Readmission Risk Prevention Plan  Transportation Screening Complete Complete Complete  PCP or Specialist Appt within 5-7 Days  Complete   PCP or Specialist Appt within 3-5 Days Complete    Home Care Screening  Complete   Medication Review (RN CM)  Complete   HRI or Home Care Consult Complete    Social Work Consult for Recovery Care Planning/Counseling Complete    Palliative Care Screening Not Applicable    Medication Review Oceanographer) Complete  Complete  PCP or Specialist appointment within 3-5 days of discharge   Complete  HRI or Home Care Consult   Complete  SW Recovery Care/Counseling Consult   Complete  Palliative Care Screening   Not Applicable  Skilled Nursing Facility   Not Applicable

## 2024-05-21 NOTE — Care Management Important Message (Signed)
 Important Message  Patient Details  Name: Karen Lewis MRN: [id number] Date of Birth: [birth year]   Important Message Given:  Yes - Medicare IM     Audianna Landgren L Lindalee Huizinga 05/21/2024, 12:08 PM

## 2024-05-26 ENCOUNTER — Encounter: Payer: Self-pay | Admitting: Oncology

## 2024-05-28 ENCOUNTER — Ambulatory Visit: Admitting: Urology

## 2024-05-28 VITALS — BP 109/75 | HR 88 | Ht 63.0 in | Wt 340.0 lb

## 2024-05-28 DIAGNOSIS — N390 Urinary tract infection, site not specified: Secondary | ICD-10-CM | POA: Diagnosis not present

## 2024-05-28 LAB — URINALYSIS, COMPLETE
Bilirubin, UA: NEGATIVE
Glucose, UA: NEGATIVE
Ketones, UA: NEGATIVE
Nitrite, UA: NEGATIVE
Protein,UA: NEGATIVE
RBC, UA: NEGATIVE
Specific Gravity, UA: 1.02 (ref 1.005–1.030)
Urobilinogen, Ur: 0.2 mg/dL (ref 0.2–1.0)
pH, UA: 6 (ref 5.0–7.5)

## 2024-05-28 LAB — MICROSCOPIC EXAMINATION: Epithelial Cells (non renal): >10 /HPF — AB (ref 0–10)

## 2024-05-28 MED ORDER — CEPHALEXIN 250 MG PO CAPS: 250.0000 mg | ORAL_CAPSULE | Freq: Every day | ORAL | 0 refills | Status: AC

## 2024-05-28 MED ORDER — CEPHALEXIN 250 MG PO CAPS: 250.0000 mg | ORAL_CAPSULE | Freq: Every day | ORAL | 0 refills | Status: DC

## 2024-05-28 MED ORDER — CEPHALEXIN 250 MG PO CAPS
250.0000 mg | ORAL_CAPSULE | Freq: Every day | ORAL | 10 refills | Status: DC
Start: 2024-05-28 — End: 2024-05-28

## 2024-05-28 MED ORDER — CEPHALEXIN 250 MG PO CAPS: 250.0000 mg | ORAL_CAPSULE | Freq: Every day | ORAL | 10 refills | Status: AC

## 2024-05-28 NOTE — Progress Notes (Signed)
 05/28/2024 11:29 AM   Karen Lewis Mar 23, 1971 [id number]  Referring provider: [name], MD [address] [facility] [place],  [place] [address]  No chief complaint on file.   HPI: he patient reports an infection at least every 3 months when she feels tired foul-smelling urine burning and respond favorably antibiotics.  She had the same problem years ago.  She recently had MRSA.  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  Gets up once a night.  No hysterectomy.     Work-up and pathophysiology of chronic cystitis discussed.  Call if ultrasound normal.  Call culture normal.  Reassess in 6 weeks on daily Macrodantin  100 mg 3x11   Today Frequency stable.  Last culture negative.  Ultrasound normal. Infection free on Macrodantin .  Very pleased     Today Patient saw a nurse practitioner in November 2022 with a breakthrough infection and positive culture.  I have not seen her for a year. Recent urine culture a few days ago by primary care normal.  Clinically not infected today  0x11 Macrodantin  sent to pharmacy. I will see in a year. If she starts getting too many breakthrough infections we will switch prophylaxis    Today Patient has been on and off her prophylaxis because she has been on many medications for bronchitis pulmonary issues and facial issues.  She did have 1 infection in between these other issues being treated when she was off the Macrodantin .  Clinically not infected today.  Frequency stable see in 1 year on Macrodantin   Today It appears the patient had a breakthrough infection with gross hematuria many weeks ago and a week later ended up in the hospital with appendicitis.  She had surgery and then ended up with a bowel obstruction and a emergency ventral hernia surgery.  She had gross hematuria while she was in the  hospital but this is cleared.  She was on Macrodantin  when this all occurred but has not been on it since her hospital discharge.  She had been given Zosyn  in the hospital.  May 16, 2024 CT scan demonstrated normal kidneys.  No recent urine culture.  I reviewed her September discharge note.  She has a number of medical issues noted.  She had been given Augmentin  as an outpatient before the admission to the hospital.  She presented with a fever and right lower quadrant pain.  No blood in urine today on microscopic examination and urine sent for culture     PMH: Past Medical History:  Diagnosis Date   Anemia    Anginal pain (HCC)    Anxiety    Arthralgia of hip 07/29/2015   Arthritis    Arthritis, degenerative 07/29/2015   Asthma    Cephalalgia 07/25/2014   Dependence on unknown drug (HCC)    multiplt controlled drug dependence   Depression    Diabetes mellitus without complication (HCC)    Difficult intubation    per pt needs small tube (has had abrasions from tube in past)   Dysrhythmia    PVC's   Eczema    Fibromyalgia    Gastritis    GERD (gastroesophageal reflux disease)    Gonalgia 07/29/2015   Overview:  Overview:  The patient has had bilateral intra-articular Hyalgan injections done on 07/16/2014 and although she seems to do  well with this type of therapy, apparently her insurance company does not want to pay for they Hyalgan. On 11/27/2014 the patient underwent a bilateral genicular nerve block with excellent results. On 01/28/2015 she had a right knee genicular radiofrequency ablatio   Gout    H/O cardiovascular disorder 03/10/2015   H/O surgical procedure 12/05/2012   Overview:  LSG ([facility] - April 2013)     H/O thyroid  disease 03/10/2015   Headache    Herpes    History of artificial joint 07/29/2015   History of hiatal hernia    Hypertension    Hypomagnesemia    Hypothyroidism    IDA (iron  deficiency anemia) 05/28/2019   LBP (low back pain) 07/29/2015    Neuromuscular disorder (HCC)    Obesity    PCOS (polycystic ovarian syndrome)    Primary osteoarthritis of both knees 07/29/2015   Sleep apnea    not using a Cpap machine at this time - most recent test mild apnea does not qualify for test (not OSA)   Thyroid  nodule bilateral   Umbilical hernia     Surgical History: Past Surgical History:  Procedure Laterality Date   APPENDECTOMY     CARPAL TUNNEL RELEASE Bilateral    CHOLECYSTECTOMY     COLONOSCOPY WITH PROPOFOL  N/A 02/11/2021   Procedure: COLONOSCOPY WITH PROPOFOL ;  Surgeon: Toledo, Ladell POUR, MD;  Location: [facility];  Service: Gastroenterology;  Laterality: N/A;   DIAGNOSTIC LAPAROSCOPY     ESOPHAGOGASTRODUODENOSCOPY (EGD) WITH PROPOFOL  N/A 02/11/2021   Procedure: ESOPHAGOGASTRODUODENOSCOPY (EGD) WITH PROPOFOL ;  Surgeon: Toledo, Ladell POUR, MD;  Location: [facility];  Service: Gastroenterology;  Laterality: N/A;   HIATAL HERNIA REPAIR     JOINT REPLACEMENT Bilateral hip   LAPAROSCOPIC LYSIS OF ADHESIONS N/A 05/12/2024   Procedure: LYSIS, ADHESIONS, LAPAROSCOPIC;  Surgeon: Evonnie Dorothyann LABOR, DO;  Location: [facility];  Service: General;  Laterality: N/A;   LAPAROSCOPIC PARTIAL GASTRECTOMY     left trigger finger     peniculectomy N/A 07/05/2018   ROUX-EN-Y GASTRIC BYPASS  06/03/2017   SHOULDER ARTHROSCOPY Right    THYROIDECTOMY N/A 11/12/2015   Procedure: THYROIDECTOMY;  Surgeon: Deward Dolly, MD;  Location: [facility];  Service: ENT;  Laterality: N/A;   TOTAL HIP ARTHROPLASTY Right 11/27/2019   Procedure: TOTAL HIP ARTHROPLASTY ANTERIOR APPROACH;  Surgeon: Kathlynn Sharper, MD;  Location: [facility];  Service: Orthopedics;  Laterality: Right;   TRIGGER FINGER RELEASE Right    TRIGGER FINGER RELEASE Right 04/24/2020   Procedure: Right ring and middle trigger release;  Surgeon: Kathlynn Sharper, MD;  Location: [facility];  Service: Orthopedics;  Laterality: Right;   VENTRAL HERNIA REPAIR N/A 05/16/2024   Procedure: REPAIR, HERNIA, VENTRAL;   Surgeon: Evonnie Dorothyann LABOR, DO;  Location: [facility];  Service: General;  Laterality: N/A;   XI ROBOTIC LAPAROSCOPIC ASSISTED APPENDECTOMY N/A 05/12/2024   Procedure: APPENDECTOMY, ROBOT-ASSISTED, LAPAROSCOPIC;  Surgeon: Evonnie Dorothyann LABOR, DO;  Location: [facility];  Service: General;  Laterality: N/A;    Home Medications:  Allergies as of 05/28/2024       Reactions   Bactrim [sulfamethoxazole-trimethoprim] Hives   Omalizumab Itching, Hives   Ciprofloxacin Other (See Comments)   myalgia   Shellfish Allergy Other (See Comments)   + positive allergy test   Nsaids Nausea Only, Other (See Comments)   Stomach upset        Medication List        Accurate as of May 28, 2024 11:29 AM. If you have any questions, ask  your nurse or doctor.          acidophilus Caps capsule Take 1 capsule by mouth daily.   albuterol  108 (90 Base) MCG/ACT inhaler Commonly known as: VENTOLIN  HFA Inhale 2 puffs into the lungs every 6 (six) hours as needed for wheezing or shortness of breath.   albuterol  (2.5 MG/3ML) 0.083% nebulizer solution Commonly known as: PROVENTIL  Take 2.5 mg by nebulization every 6 (six) hours as needed for wheezing or shortness of breath.   amitriptyline  100 MG tablet Commonly known as: ELAVIL  Take 100 mg by mouth at bedtime.   butalbital -acetaminophen -caffeine  50-325-40 MG tablet Commonly known as: FIORICET  Take 1 tablet by mouth every 6 (six) hours as needed for migraine.   calcium  carbonate 750 MG chewable tablet Commonly known as: TUMS EX Chew 1 tablet by mouth daily.   clonazePAM  0.5 MG tablet Commonly known as: KLONOPIN  Take 0.5 mg by mouth 2 (two) times daily as needed for anxiety.   cyanocobalamin  1000 MCG tablet Commonly known as: VITAMIN B12 Take 1,000 mcg by mouth 2 (two) times a week. Mondays and Fridays   diclofenac  sodium 1 % Gel Commonly known as: Voltaren  Apply 2 g topically 4 (four) times daily. What changed:  when to take  this reasons to take this   diphenhydrAMINE  25 MG tablet Commonly known as: BENADRYL  Take 25 mg by mouth every 8 (eight) hours as needed for itching or allergies.   DULoxetine  60 MG capsule Commonly known as: CYMBALTA  Take 60 mg by mouth daily.   Dupilumab 300 MG/2ML Soaj Inject 300 mg into the skin every 14 (fourteen) days. 15th and 30th of every month   Emgality 120 MG/ML Soaj Generic drug: Galcanezumab-gnlm Inject 120 mg into the skin every 30 (thirty) days. 30th of each month   EPINEPHrine  0.3 mg/0.3 mL Soaj injection Commonly known as: EPI-PEN Inject 0.3 mg into the muscle as needed for anaphylaxis.   ergocalciferol  1.25 MG (50000 UT) capsule Commonly known as: VITAMIN D2 Take 50,000 Units by mouth 2 (two) times a week. Mondays and Fridays   estradiol  0.05 mg/24hr patch Commonly known as: CLIMARA  - Dosed in mg/24 hr Place 0.05 mg onto the skin 2 (two) times a week. Mondays and Fridays   estradiol  0.1 MG/GM vaginal cream Commonly known as: ESTRACE  Place 1 g vaginally.   famotidine  20 MG tablet Commonly known as: PEPCID  Take 20 mg by mouth 2 (two) times daily.   fluticasone  furoate-vilanterol 200-25 MCG/ACT Aepb Commonly known as: BREO ELLIPTA  Inhale 1 puff into the lungs daily.   hydrochlorothiazide 25 MG tablet Commonly known as: HYDRODIURIL Take 25 mg by mouth daily.   ketorolac  10 MG tablet Commonly known as: TORADOL  Take 1 tablet (10 mg total) by mouth every 6 (six) hours as needed.   levocetirizine 5 MG tablet Commonly known as: XYZAL  Take 5 mg by mouth at bedtime.   levothyroxine  125 MCG tablet Commonly known as: SYNTHROID  Take 125-137.5 mcg by mouth every morning. 125mcg on all days except Sundays. Take 137.5 on Sundays only   lisinopril  5 MG tablet Commonly known as: ZESTRIL  Take 5 mg by mouth at bedtime.   magnesium  oxide 400 MG tablet Commonly known as: MAG-OX Take 1,200 mg by mouth 2 (two) times daily. Take 1200 mg by mouth in the morning  and 1200 mg at bedtime   meclizine  25 MG tablet Commonly known as: ANTIVERT  Take 25 mg by mouth 3 (three) times daily as needed for dizziness.   metFORMIN  500 MG 24  hr tablet Commonly known as: GLUCOPHAGE -XR Take 500 mg by mouth 2 (two) times daily.   metoprolol  tartrate 25 MG tablet Commonly known as: LOPRESSOR  Take 1 tablet (25 mg total) by mouth 2 (two) times daily.   montelukast  10 MG tablet Commonly known as: SINGULAIR  Take 10 mg by mouth at bedtime.   naloxone  4 MG/0.1ML Liqd nasal spray kit Commonly known as: NARCAN  Place 1 spray into the nose as needed for up to 365 doses (for opioid-induced respiratory depresssion). In case of emergency (overdose), spray once into each nostril. If no response within 3 minutes, repeat application and call 911.   naphazoline-pheniramine 0.025-0.3 % ophthalmic solution Commonly known as: NAPHCON-A Place 1 drop into both eyes 4 (four) times daily as needed for eye irritation.   nystatin  powder Commonly known as: MYCOSTATIN /NYSTOP  Apply 1 Application topically 2 (two) times daily as needed (for irritation).   omeprazole 40 MG capsule Commonly known as: PRILOSEC Take 40 mg by mouth in the morning and at bedtime.   oxyCODONE  5 MG immediate release tablet Commonly known as: Oxy IR/ROXICODONE  Take 1 tablet (5 mg total) by mouth every 8 (eight) hours as needed for severe pain (pain score 7-10). Must last 30 days   oxyCODONE  5 MG immediate release tablet Commonly known as: Roxicodone  Take 1 tablet (5 mg total) by mouth every 6 (six) hours as needed for severe pain (pain score 7-10) or breakthrough pain.   Ozempic (0.25 or 0.5 MG/DOSE) 2 MG/1.5ML Sopn Generic drug: Semaglutide(0.25 or 0.5MG /DOS) Inject 2 mg into the skin every Tuesday.   pregabalin  225 MG capsule Commonly known as: LYRICA  Take 225 mg by mouth 2 (two) times daily.   progesterone  100 MG capsule Commonly known as: PROMETRIUM  Take 100 mg by mouth at bedtime.    QUEtiapine  50 MG tablet Commonly known as: SEROQUEL  Take 50 mg by mouth at bedtime.   rizatriptan 10 MG tablet Commonly known as: MAXALT Take 10 mg by mouth as needed for migraine. Take 1 and may repeat in 2 hours for MIgraines, alternate with the disintrgrating tablet.   rosuvastatin  40 MG tablet Commonly known as: CRESTOR  Take 40 mg by mouth daily.   Tab-A-Vite Tabs Take 2 tablets by mouth every morning.   tiZANidine  4 MG tablet Commonly known as: ZANAFLEX  Take 1 tablet (4 mg total) by mouth 2 (two) times daily as needed for muscle spasms.   topiramate  100 MG tablet Commonly known as: TOPAMAX  Take 100 mg by mouth 2 (two) times daily.   vitamin k 100 MCG tablet Take 100 mcg by mouth daily.        Allergies:  Allergies  Allergen Reactions   Bactrim [Sulfamethoxazole-Trimethoprim] Hives   Omalizumab Itching and Hives   Ciprofloxacin Other (See Comments)    myalgia    Shellfish Allergy Other (See Comments)    + positive allergy test   Nsaids Nausea Only and Other (See Comments)    Stomach upset    Family History: Family History  Problem Relation Age of Onset   Anxiety disorder Mother    Depression Mother    Alcohol  abuse Mother    Diabetes Mother    Hypertension Mother    Kidney cancer Mother    Sleep apnea Mother    Alcohol  abuse Father    Anxiety disorder Father    Depression Father    Post-traumatic stress disorder Father    Kidney failure Father    COPD Father    Diabetes Father    Hypertension  Father    Sleep apnea Father    Depression Brother    Diabetes Brother    Hypertension Brother    Sleep apnea Brother    Breast cancer Paternal Aunt 28   Breast cancer Paternal Aunt 63   Brain cancer Paternal Uncle 87   Bladder Cancer Neg Hx    Prostate cancer Neg Hx     Social History:  reports that she has never smoked. She has never used smokeless tobacco. She reports that she does not drink alcohol  and does not use drugs.  ROS:                                         Physical Exam: LMP  (LMP Unknown)   Constitutional:  Alert and oriented, No acute distress. HEENT: [place] AT, moist mucus membranes.  Trachea midline, no masses.   Laboratory Data: Lab Results  Component Value Date   WBC 11.4 (H) 05/21/2024   HGB 11.9 (L) 05/21/2024   HCT 38.0 05/21/2024   MCV 93.6 05/21/2024   PLT 444 (H) 05/21/2024    Lab Results  Component Value Date   CREATININE 0.83 05/21/2024    No results found for: PSA  No results found for: TESTOSTERONE   Lab Results  Component Value Date   HGBA1C 6.6 (H) 05/12/2024    Urinalysis    Component Value Date/Time   COLORURINE YELLOW 05/12/2024 1151   APPEARANCEUR CLEAR 05/12/2024 1151   APPEARANCEUR Hazy (A) 03/14/2023 1309   LABSPEC 1.012 05/12/2024 1151   LABSPEC 1.003 03/25/2013 2018   PHURINE 7.0 05/12/2024 1151   GLUCOSEU NEGATIVE 05/12/2024 1151   GLUCOSEU Negative 03/25/2013 2018   HGBUR NEGATIVE 05/12/2024 1151   BILIRUBINUR NEGATIVE 05/12/2024 1151   BILIRUBINUR Negative 03/14/2023 1309   BILIRUBINUR Negative 03/25/2013 2018   KETONESUR NEGATIVE 05/12/2024 1151   PROTEINUR NEGATIVE 05/12/2024 1151   UROBILINOGEN 0.2 08/04/2020 1345   UROBILINOGEN 0.2 05/09/2015 0115   NITRITE NEGATIVE 05/12/2024 1151   LEUKOCYTESUR NEGATIVE 05/12/2024 1151   LEUKOCYTESUR 2+ 03/25/2013 2018    Pertinent Imaging: Urine reviewed and sent for culture.  Chart reviewed  Assessment & Plan: I would like to put the patient back on Macrodantin .  I do not think she needs cystoscopy at this stage but certainly if she develops gross hematuria with a negative culture I will perform this in the future.  Her CT scan looked good.  No blood in urine on urinalysis today.  Call if culture positive  Insurance apparently will not cover the Macrodantin .  I called in t cephalexin  250 mg a day 30 x 11 and reassess 4 months for durability and she agreed with the plan.  She is a Engineer, civil (consulting).   She gets hives with sulfa but we decided not to try trimethoprim but we did discuss it as a potential in the future  1. Recurrent UTI (Primary)  - Urinalysis, Complete   No follow-ups on file.  Karen DELENA Elizabeth, MD  [facility] [address], [address] [place], [place] [address] [phone]

## 2024-05-30 ENCOUNTER — Ambulatory Visit (INDEPENDENT_AMBULATORY_CARE_PROVIDER_SITE_OTHER): Admitting: Surgery

## 2024-05-30 ENCOUNTER — Encounter: Payer: Self-pay | Admitting: Surgery

## 2024-05-30 VITALS — BP 124/82 | HR 81 | Temp 98.3°F | Resp 18 | Ht 63.0 in | Wt 339.0 lb

## 2024-05-30 DIAGNOSIS — Z09 Encounter for follow-up examination after completed treatment for conditions other than malignant neoplasm: Secondary | ICD-10-CM

## 2024-05-30 NOTE — Progress Notes (Addendum)
 [facility] Note   HPI:  53 y.o. Female presents to clinic for post-op follow-up s/p robotic assisted laparoscopic appendectomy on 8/30 and subsequent open primary ventral hernia repair on 9/3 secondary to incarcerated umbilical hernia with small bowel obstruction.  Patient is overall doing much better since her surgeries.  She abdominal pain, though she does have chronic pain and is hard to differentiate from her baseline pain.  She has had decreased appetite but is able to eat and is trying to eat high-protein foods.  She confirms having some nausea.  She also will get with her Ozempic which she restarted yesterday.  She is having more normal bowel function, and restarted Imodium to help with her looser bowel movements.  She denies issues at her incision sites.  She has been keeping them clean and removing any scabbing.  Review of Systems:  All other review of systems: otherwise negative   Vital Signs:  Resp 18   Ht 5' 3 (1.6 m)   Wt (!) 339 lb (153.8 kg)   LMP  (LMP Unknown)   BMI 60.05 kg/m    Physical Exam:  Physical Exam Vitals reviewed.  Constitutional:      Appearance: Normal appearance.  Abdominal:     Comments: Abdomen soft, nondistended, no percussion tenderness, nontender to palpation; no rigidity, guarding, or rebound tenderness; laparoscopic incision sites well healed with staples in place, midline incision with skin staples in place, mild erythema at location of staples, underlying seroma, nontender to palpation  Neurological:     Mental Status: She is alert.     Laboratory studies: None   Imaging:  None  Pathology: A. APPENDIX, APPENDECTOMY:  - Acute appendicitis    Assessment:  53 y.o. yo Female who presents for follow up s/p robotic assisted laparoscopic appendectomy on 8/30 and subsequent open primary ventral hernia repair on 9/3 secondary to incarcerated umbilical hernia with small bowel obstruction.  Plan:  - Patient overall doing well  since the surgery.  Tolerating a diet, moving her bowels, and pain at baseline - Skin staples removed from all incision sites.  Steri-Strips placed at midline incision.  Advised that these will fall off in 3 to 7 days - Discussed that she has a seroma at her previous hernia site.  I explained that this will reabsorb over the next 4 to 6 months - She should notify us  if she has any concerns at her incision site or concern for infection at her hernia site -Discussed that she should also take it easy with no heavy lifting for an additional 2 to 3 weeks for total of 4 weeks after her hernia surgery - Follow up with me as needed  All of the above recommendations were discussed with the patient, and all of patient's questions were answered to her expressed satisfaction.  Note: Portions of this report may have been transcribed using voice recognition software. Every effort has been made to ensure accuracy; however, inadvertent computerized transcription errors may still be present.   Dorothyann Brittle, DO [facility] [address] [place], [place] [address] [phone] (office)

## 2024-06-04 NOTE — Procedures (Signed)
 Time out protocol was followed to verify the identity of the patient and the correct procedure being performed with the patient's participation and agreement. Armband verified per protocol. Patient verbally stated patient's name and date of birth and confirmed, along with clinic staff, that information matches what is printed on armband. Armband located on patient's right wrist. Patient signed prepared e-consent, which was electronically transferred to patient chart. This clinic RN verified that e-consent had successfully transferred over to patients medical record.

## 2024-06-10 ENCOUNTER — Encounter: Payer: Self-pay | Admitting: Oncology

## 2024-06-12 ENCOUNTER — Encounter: Payer: Self-pay | Admitting: Oncology

## 2024-06-12 ENCOUNTER — Ambulatory Visit: Admitting: Pain Medicine

## 2024-06-12 ENCOUNTER — Ambulatory Visit: Attending: Nurse Practitioner | Admitting: Nurse Practitioner

## 2024-06-12 ENCOUNTER — Encounter: Payer: Self-pay | Admitting: Nurse Practitioner

## 2024-06-12 VITALS — BP 128/82 | HR 89 | Temp 97.2°F | Ht 63.0 in | Wt 330.0 lb

## 2024-06-12 DIAGNOSIS — M25561 Pain in right knee: Secondary | ICD-10-CM | POA: Diagnosis present

## 2024-06-12 DIAGNOSIS — G894 Chronic pain syndrome: Secondary | ICD-10-CM | POA: Diagnosis present

## 2024-06-12 DIAGNOSIS — Z79891 Long term (current) use of opiate analgesic: Secondary | ICD-10-CM | POA: Diagnosis present

## 2024-06-12 DIAGNOSIS — M25512 Pain in left shoulder: Secondary | ICD-10-CM | POA: Insufficient documentation

## 2024-06-12 DIAGNOSIS — M25562 Pain in left knee: Secondary | ICD-10-CM | POA: Insufficient documentation

## 2024-06-12 DIAGNOSIS — G8929 Other chronic pain: Secondary | ICD-10-CM | POA: Diagnosis present

## 2024-06-12 DIAGNOSIS — M545 Low back pain, unspecified: Secondary | ICD-10-CM | POA: Diagnosis present

## 2024-06-12 DIAGNOSIS — M25511 Pain in right shoulder: Secondary | ICD-10-CM | POA: Diagnosis present

## 2024-06-12 MED ORDER — OXYCODONE HCL 5 MG PO TABS: 5.0000 mg | ORAL_TABLET | Freq: Three times a day (TID) | ORAL | 0 refills | Status: DC | PRN

## 2024-06-12 NOTE — Progress Notes (Addendum)
 Patient: Karen Lewis  Service: E/M   PCP: [name], MD  DOB: 1971/01/21  DOS: 06/12/2024  Provider: Emmy MARLA Blanch, NP  MRN: [id number]  Delivery: Face-to-face  Specialty: Interventional Pain Management  Type: Established Patient  Setting: Ambulatory outpatient facility  Specialty designation: 09  Referring Prov.: [name], MD  Location: Outpatient office facility       History of present illness (HPI) [name], a 53 y.o. year old female, is here today because of her Chronic right shoulder pain [M25.511, G89.29]. Karen Lewis primary complain today is Chronic Bilateral shoulder Pain   Pertinent problems: Karen Lewis has  Fibromyalgia; Chronic knee pain (1ry area of Pain) (Bilateral) (R>L); Chronic low back pain (3ry area of Pain) (Bilateral) (R>L) w/o sciatica; Lumbar facet syndrome (Bilateral) (R>L); Secondary osteoarthritis of multiple sites; Grade 1  Anterolisthesis of L3/L4 & L4/L5 (1.4 cm); Chronic hip pain s/p THR (2ry area of pain) (Bilateral); S/P THR (total hip replacement) (Left); Lumbar spondylosis; and Chronic pain syndrome on their pertinent problem list.  Pain Assessment: Severity of Chronic pain is reported as a 5 /10. Location: Shoulder Left/Denies. Onset: More than a month ago. Quality: Aching, Burning, Constant. Timing: Constant. Modifying factor(s):  SABRA Vitals:  height is 5' 3 (1.6 m) and weight is 330 lb (149.7 kg) (abnormal). Her temperature is 97.2 F (36.2 C) (abnormal). Her blood pressure is 128/82 and her pulse is 89. Her oxygen saturation is 98%.  BMI: Estimated body mass index is 58.46 kg/m as calculated from the following:   Height as of this encounter: 5' 3 (1.6 m).   Weight as of this encounter: 330 lb (149.7 kg).  Last encounter: 04/19/2024. Last procedure: 03/15/2024  Reason for encounter: evaluation for possible interventional PM therapy/treatment and medication management.  The patient indicates doing well with current medication regimen.  No  side effects or adverse reaction reported to this medication.  The patient complains of chronic bilateral shoulder pain, which intermittently affects her daily functional activities and range of motion.   Pharmacotherapy Assessment   Oxycodone  (Oxy IR/oxycodone ) 5 mg immediate release every 8 hours as needed for pain. MME=22.50 Monitoring: [place] PMP: PDMP reviewed during this encounter.       Pharmacotherapy: No side-effects or adverse reactions reported. Compliance: No problems identified. Effectiveness: Clinically acceptable.  Delores Dorothe DELENA, RN  06/12/2024 12:19 PM  Sign when Signing Visit Nursing Pain Medication Assessment:  Safety precautions to be maintained throughout the outpatient stay will include: orient to surroundings, keep bed in low position, maintain call bell within reach at all times, provide assistance with transfer out of bed and ambulation.  Medication Inspection Compliance: Pill count conducted under aseptic conditions, in front of the patient. Neither the pills nor the bottle was removed from the patient's sight at any time. Once count was completed pills were immediately returned to the patient in their original bottle.  Medication: Oxycodone  IR Pill/Patch Count: 7 of 90 pills/patches remain Pill/Patch Appearance: Markings consistent with prescribed medication Bottle Appearance: Standard pharmacy container. Clearly labeled. Filled Date: 7 / 3 / 2025 Last Medication intake:  TodaySafety precautions to be maintained throughout the outpatient stay will include: orient to surroundings, keep bed in low position, maintain call bell within reach at all times, provide assistance with transfer out of bed and ambulation.     UDS:  Summary  Date Value Ref Range Status  04/19/2024 FINAL  Final    Comment:    ==================================================================== [employer] [facility]  13 (MW) ==================================================================== Test                              Result       Flag       Units  Drug Absent but Declared for Prescription Verification   Clonazepam                      Not Detected UNEXPECTED ng/mg creat   Oxycodone                       Not Detected UNEXPECTED ng/mg creat   Butalbital                      Not Detected UNEXPECTED ==================================================================== Test                      Result    Flag   Units      Ref Range   Creatinine              95               mg/dL      >=79 ==================================================================== Declared Medications:  The flagging and interpretation on this report are based on the  following declared medications.  Unexpected results may arise from  inaccuracies in the declared medications.   **Note: The testing scope of this panel includes these medications:   Butalbital  (Fioricet )  Clonazepam  (Klonopin )  Oxycodone    **Note: The testing scope of this panel does not include the  following reported medications:   Acetaminophen  (Tylenol )  Acetaminophen  (Fioricet )  Albuterol  (Ventolin  HFA)  Amitriptyline  (Elavil )  Biotin   Caffeine  (Fioricet )  Calcium  (Tums)  Diphenhydramine  (Benadryl )  Duloxetine  (Cymbalta )  Dupilumab  Epinephrine  (EpiPen )  Estradiol  (Estrace )  Famotidine  (Pepcid )  Galcanezumab (Emgality)  Hydrochlorothiazide  Levocetirizine (Xyzal )  Levothyroxine  (Synthroid )  Lisinopril  (Zestril )  Magnesium  (Mag-Ox)  Meclizine  (Antivert )  Metformin   Metoprolol  (Lopressor )  Montelukast  (Singulair )  Naloxone  (Narcan )  Nitrofurantoin  (Macrobid )  Nystatin  (Mycostatin )  Omeprazole (Prilosec)  Phentermine (Adipex)  Pregabalin  (Lyrica )  Probiotic  Progesterone  (Prometrium )  Quetiapine  (Seroquel )  Rizatriptan (Maxalt)  Rosuvastatin  (Crestor )  Semaglutide (Ozempic)  Tizanidine  (Zanaflex )  Topical Diclofenac  (Voltaren )  Topiramate  (Topamax )  Vitamin B12  Vitamin  D2 ==================================================================== For clinical consultation, please call [phone]. ====================================================================     No results found for: CBDTHCR No results found for: D8THCCBX No results found for: D9THCCBX  ROS  Constitutional: Denies any fever or chills Gastrointestinal: No reported hemesis, hematochezia, vomiting, or acute GI distress Musculoskeletal: Bilateral shoulder pain  Neurological: No reported episodes of acute onset apraxia, aphasia, dysarthria, agnosia, amnesia, paralysis, loss of coordination, or loss of consciousness  Medication Review  DULoxetine , Dupilumab, EPINEPHrine , Galcanezumab-gnlm, QUEtiapine , Semaglutide(0.25 or 0.5MG /DOS), Tab-A-Vite, acidophilus, albuterol , amitriptyline , butalbital -acetaminophen -caffeine , calcium  carbonate, cephALEXin , clonazePAM , cyanocobalamin , diclofenac  sodium, diphenhydrAMINE , ergocalciferol , estradiol , famotidine , fluticasone  furoate-vilanterol, hydrochlorothiazide, ketorolac , levocetirizine, levothyroxine , lisinopril , magnesium  oxide, meclizine , metFORMIN , metoprolol  tartrate, montelukast , naloxone , naphazoline-pheniramine, nystatin , omeprazole, oxyCODONE , pregabalin , progesterone , rizatriptan, rosuvastatin , tiZANidine , topiramate , and vitamin k  History Review  Allergy: Karen Lewis is allergic to bactrim [sulfamethoxazole-trimethoprim], omalizumab, ciprofloxacin, shellfish allergy, and nsaids. Drug: Karen Lewis  reports no history of drug use. Alcohol :  reports no history of alcohol  use. Tobacco:  reports that she has never smoked. She has never used smokeless tobacco. Social: Karen Lewis  reports that she has never smoked. She has never used smokeless tobacco. She reports that  she does not drink alcohol  and does not use drugs. Medical:  has a past medical history of Anemia, Anginal pain, Anxiety, Arthralgia of hip (07/29/2015), Arthritis, Arthritis,  degenerative (07/29/2015), Asthma, Cephalalgia (07/25/2014), Dependence on unknown drug (HCC), Depression, Diabetes mellitus without complication (HCC), Difficult intubation, Dysrhythmia, Eczema, Fibromyalgia, Gastritis, GERD (gastroesophageal reflux disease), Gonalgia (07/29/2015), Gout, H/O cardiovascular disorder (03/10/2015), H/O surgical procedure (12/05/2012), H/O thyroid  disease (03/10/2015), Headache, Herpes, History of artificial joint (07/29/2015), History of hiatal hernia, Hypertension, Hypomagnesemia, Hypothyroidism, IDA (iron  deficiency anemia) (05/28/2019), LBP (low back pain) (07/29/2015), Neuromuscular disorder (HCC), Obesity, PCOS (polycystic ovarian syndrome), Primary osteoarthritis of both knees (07/29/2015), Sleep apnea, Thyroid  nodule (bilateral), and Umbilical hernia. Surgical: Karen Lewis  has a past surgical history that includes Laparoscopic partial gastrectomy; Shoulder arthroscopy (Right); Carpal tunnel release (Bilateral); Diagnostic laparoscopy; Cholecystectomy; Trigger finger release (Right); Thyroidectomy (N/A, 11/12/2015); left trigger finger; Roux-en-Y Gastric Bypass (06/03/2017); Hiatal hernia repair; peniculectomy (N/A, 07/05/2018); Total hip arthroplasty (Right, 11/27/2019); Joint replacement (Bilateral, hip); Appendectomy; Trigger finger release (Right, 04/24/2020); Colonoscopy with propofol  (N/A, 02/11/2021); Esophagogastroduodenoscopy (egd) with propofol  (N/A, 02/11/2021); Xi robotic laparoscopic assisted appendectomy (N/A, 05/12/2024); Laparoscopic lysis of adhesions (N/A, 05/12/2024); and Ventral hernia repair (N/A, 05/16/2024). Family: family history includes Alcohol  abuse in her father and mother; Anxiety disorder in her father and mother; Brain cancer (age of onset: 13) in her paternal uncle; Breast cancer (age of onset: 76) in her paternal aunt and paternal aunt; COPD in her father; Depression in her brother, father, and mother; Diabetes in her brother, father, and mother;  Hypertension in her brother, father, and mother; Kidney cancer in her mother; Kidney failure in her father; Post-traumatic stress disorder in her father; Sleep apnea in her brother, father, and mother.  Laboratory Chemistry Profile   Renal Lab Results  Component Value Date   BUN 10 05/21/2024   CREATININE 0.83 05/21/2024   GFRAA >60 04/16/2020   GFRNONAA >60 05/21/2024    Hepatic Lab Results  Component Value Date   AST 17 05/13/2024   ALT 26 05/13/2024   ALBUMIN 3.0 (L) 05/16/2024   ALKPHOS 64 05/13/2024   LIPASE 40 05/12/2024    Electrolytes Lab Results  Component Value Date   NA 137 05/21/2024   K 3.9 05/21/2024   CL 107 05/21/2024   CALCIUM  7.6 (L) 05/21/2024   MG 2.2 05/21/2024   PHOS 4.1 05/19/2024    Bone Lab Results  Component Value Date   VD25OH 27.4 (L) 11/24/2015   VD125OH2TOT 41.5 11/24/2015    Inflammation (CRP: Acute Phase) (ESR: Chronic Phase) Lab Results  Component Value Date   CRP 0.7 09/27/2019   ESRSEDRATE 30 (H) 09/27/2019   LATICACIDVEN 1.0 05/12/2024         Note: Above Lab results reviewed.  Recent Imaging Review   Narrative & Impression  CLINICAL DATA:  Right shoulder pain   EXAM: RIGHT SHOULDER - 2+ VIEW   COMPARISON:  None Available.   FINDINGS: No acute fracture or dislocation. Moderate joint space loss of the glenohumeral joint. Degenerative changes of the [facility] joint. Soft tissues are unremarkable.   IMPRESSION: 1. No acute fracture or dislocation. 2. Moderate joint space loss of the glenohumeral joint, likely degenerative.     Electronically Signed   By: Rogelia Myers M.D.   On: 04/19/2024 17:27   Narrative & Impression  MR SHOULDER WITHOUT IV CONTRAST LEFT   COMPARISON: None.   CLINICAL HISTORY: Left shoulder pain   PULSE SEQUENCES: Ax PD FS, Sag T2 FS, Cor  T1 & COR T2 FS   FINDINGS:   Bones: Moderate AC joint arthrosis and mild/moderate glenohumeral arthrosis. There is osteophyte formation and mild  joint space loss. Mild reactive edema seen in the [facility] joint. Trace subacromial and subdeltoid bursal fluid is present.   Rotator cuff: There is insertional tendinosis of the supraspinatus and infraspinatus tendons with a mild partial tear of the supraspinatus tendon. No full-thickness tear is present. Subscapularis tendon demonstrates insertional tendinosis. Teres minor tendon is intact. There is no significant fatty atrophy of the rotator cuff muscles.   Labrum and biceps tendon: There is tendinosis of the intra-articular segment the biceps tendon with marked increased signal. There is no full-thickness tear. There is mild tenosynovitis in the bicipital groove. The labrum is intact.     IMPRESSION: Insertional tendinosis of the supra and infraspinatus tendons. No full-thickness tear is present.   Moderate tendinosis of the intra-articular segment biceps tendon without full-thickness tear.   AC joint and glenohumeral joint arthrosis.   Electronically signed by: Norleen Satchel MD 02/09/2024 03:15 PM EDT RP Workstation: [id number]   Physical Exam  Vitals: BP 128/82   Pulse 89   Temp (!) 97.2 F (36.2 C)   Ht 5' 3 (1.6 m)   Wt (!) 330 lb (149.7 kg)   LMP  (LMP Unknown)   SpO2 98%   BMI 58.46 kg/m  BMI: Estimated body mass index is 58.46 kg/m as calculated from the following:   Height as of this encounter: 5' 3 (1.6 m).   Weight as of this encounter: 330 lb (149.7 kg). Ideal: Ideal body weight: 52.4 kg (115 lb 8.3 oz) Adjusted ideal body weight: 91.3 kg (201 lb 5 oz) General appearance: Well nourished, well developed, and well hydrated. In no apparent acute distress Mental status: Alert, oriented x 3 (person, place, & time)       Respiratory: No evidence of acute respiratory distress Eyes: PERLA  Musculoskeletal: +Bilateral shoulder pain, decrease ROM  Upper Extremity (UE) Exam      Right  Left  Inspection    Skin color, temperature, and hair growth are WNL. No  peripheral edema or cyanosis. No masses, redness, swelling, asymmetry, or associated skin lesions. No contractures.  Skin color, temperature, and hair growth are WNL. No peripheral edema or cyanosis. No masses, redness, swelling, asymmetry, or associated skin lesions. No contractures.          Functional ROM    Pain restricted ROM for shoulder  Pain restricted ROM for shoulder          Muscle Tone/Strength    Functionally intact. No obvious neuro-muscular anomalies detected.  Functionally intact. No obvious neuro-muscular anomalies detected.          Sensory (Neurological)    Musculoskeletal pain pattern affecting the shoulder  Musculoskeletal pain pattern affecting the shoulder          Palpation    No palpable anomalies              No palpable anomalies                      Maneuver Shoulder abduction (deltoid/supraspinatus, axillary/supra scapular n,, C5) Elbow flexion (biceps brachial, musculoskeletal n, C5-6) Elbow extension (triceps, radial n, C7) Finger abduction (interossei, ulnar n, T1)    Shoulder abduction (deltoid/supraspinatus, axillary/supra scapular n,, C5) Elbow flexion (biceps brachial, musculoskeletal n, C5-6) Elbow extension (triceps, radial n, C7) Wrist extensors (C6) Finger extensors (C8) Finger abduction (interossei, ulnar n,  T1)            Provocative Test    Phalen's test: deferred Tinel's test: deferred Apley's scratch test (touch opposite shoulder):  Action 1 (Across chest): Decreased ROM Action 2 (Overhead): Decreased ROM Action 3 (LB reach): Decreased ROM  Phalen's test: deferred Tinel's test: deferred Apley's scratch test (touch opposite shoulder):  Action 1 (Across chest): Decreased ROM Action 2 (Overhead): Decreased ROM Action 3 (LB reach): Decreased ROM             Level  Myotome  Dermatome  Sclerotome  ROM  C5  Elbow flexion  Lateral upper arm      C6  Wrist extension  Thumb and index      C7  Elbow extension  Middle finger      C8  Finger  extension  Ring and pinky finger      T1  Finger abduction  Medial elbow and axilla                                                                                                                                         Assessment   Diagnosis Status  1. Chronic shoulder pain (Right)   2. Chronic shoulder pain (Left)   3. Chronic use of opiate for therapeutic purpose   4. Chronic pain syndrome   5. Bilateral chronic knee pain   6. Chronic bilateral low back pain without sciatica    Having a Flare-up Having a Flare-up Controlled   Updated Problems: No problems updated.  Plan of Care  Problem-specific:  Assessment and Plan  Chronic use of opioid for therapeutic purpose: Patient's pain is well-controlled with oxycodone , will continue on current medication regimen.  Prescribing drug monitoring (PMP) reviewed, findings consistent with the use of prescribed medication and no evidence of narcotic misuse or abuse.  Urine drug screening (UDS) consistent with the use of prescribed medication.  Schedule follow-up in 90 days for medication management.  Chronic shoulder pain (Bilateral): The patient continues to experience bilateral shoulder pain.  Recent imaging shows: Right shoulder x-ray: Moderate glenohumeral medial joint space loss, likely due to degenerative changes. Left shoulder MRI : Degenerative changes involving the supraspinatus and infraspinatus insertions consistent with tendinosis (tendon degeneration without tear).  Findings also include acromioclavicular [facility]) joint and glenohumeral joint arthritis.  Future Plan: Bilateral shoulder injection with Dr. Tanya   Karen Lewis has a current medication list which includes the following long-term medication(s): albuterol , albuterol , diphenhydramine , estradiol , magnesium  oxide, montelukast , naloxone , progesterone , rizatriptan, hydrochlorothiazide, metoprolol  tartrate, [START ON 06/25/2024] oxycodone , [START ON  07/25/2024] oxycodone , and [START ON 08/24/2024] oxycodone .  Pharmacotherapy (Medications Ordered): Meds ordered this encounter  Medications   oxyCODONE  (OXY IR/ROXICODONE ) 5 MG immediate release tablet    Sig: Take 1 tablet (5 mg total) by mouth every 8 (eight) hours as needed for severe pain (pain score 7-10). Must last 30  days    Dispense:  90 tablet    Refill:  0    Not a duplicate. Do NOT delete! Dispense 1 day early if closed on refill date. Avoid benzodiazepines within 8 hours of opioids. Do not send refill requests.   oxyCODONE  (OXY IR/ROXICODONE ) 5 MG immediate release tablet    Sig: Take 1 tablet (5 mg total) by mouth every 8 (eight) hours as needed for severe pain (pain score 7-10). Must last 30 days    Dispense:  90 tablet    Refill:  0    Not a duplicate. Do NOT delete! Dispense 1 day early if closed on refill date. Avoid benzodiazepines within 8 hours of opioids. Do not send refill requests.   oxyCODONE  (OXY IR/ROXICODONE ) 5 MG immediate release tablet    Sig: Take 1 tablet (5 mg total) by mouth every 8 (eight) hours as needed for severe pain (pain score 7-10). Must last 30 days    Dispense:  90 tablet    Refill:  0    Not a duplicate. Do NOT delete! Dispense 1 day early if closed on refill date. Avoid benzodiazepines within 8 hours of opioids. Do not send refill requests.   Orders:  No orders of the defined types were placed in this encounter.       Return in about 3 months (around 09/11/2024) for (F2F), (MM), Emmy Blanch NP.    Recent Visits Date Type Provider Dept  04/30/24 Office Visit Tanya Glisson, MD [facility]  04/19/24 Office Visit Benen Weida K, NP [facility]  04/03/24 Office Visit Tanya Glisson, MD [facility]  03/15/24 Procedure visit Tanya Glisson, MD [facility]  Showing recent visits within past 90 days and meeting all other requirements Today's Visits Date Type Provider Dept  06/12/24 Office  Visit Izella Ybanez K, NP [facility]  Showing today's visits and meeting all other requirements Future Appointments Date Type Provider Dept  06/26/24 Appointment Tanya Glisson, MD [facility]  08/30/24 Appointment Abiha Lukehart K, NP [facility]  Showing future appointments within next 90 days and meeting all other requirements  I discussed the assessment and treatment plan with the patient. The patient was provided an opportunity to ask questions and all were answered. The patient agreed with the plan and demonstrated an understanding of the instructions.  Patient advised to call back or seek an in-person evaluation if the symptoms or condition worsens.  I personally spent a total of 30 minutes in the care of the patient today including preparing to see the patient, getting/reviewing separately obtained history, performing a medically appropriate exam/evaluation, counseling and educating, placing orders, referring and communicating with other health care professionals, documenting clinical information in the EHR, independently interpreting results, communicating results, and coordinating care.   Note by: Emmy MARLA Blanch, NP  Date: 06/12/2024; Time: 12:56 PM

## 2024-06-12 NOTE — Progress Notes (Signed)
 Nursing Pain Medication Assessment:  Safety precautions to be maintained throughout the outpatient stay will include: orient to surroundings, keep bed in low position, maintain call bell within reach at all times, provide assistance with transfer out of bed and ambulation.  Medication Inspection Compliance: Pill count conducted under aseptic conditions, in front of the patient. Neither the pills nor the bottle was removed from the patient's sight at any time. Once count was completed pills were immediately returned to the patient in their original bottle.  Medication: Oxycodone  IR Pill/Patch Count: 7 of 90 pills/patches remain Pill/Patch Appearance: Markings consistent with prescribed medication Bottle Appearance: Standard pharmacy container. Clearly labeled. Filled Date: 7 / 3 / 2025 Last Medication intake:  TodaySafety precautions to be maintained throughout the outpatient stay will include: orient to surroundings, keep bed in low position, maintain call bell within reach at all times, provide assistance with transfer out of bed and ambulation.

## 2024-06-19 ENCOUNTER — Ambulatory Visit: Admitting: Pain Medicine

## 2024-06-26 ENCOUNTER — Inpatient Hospital Stay

## 2024-06-26 ENCOUNTER — Ambulatory Visit
Admission: RE | Admit: 2024-06-26 | Discharge: 2024-06-26 | Disposition: A | Discharge status: Home / Self Care | Source: Ambulatory Visit | Attending: Pain Medicine | Admitting: Pain Medicine

## 2024-06-26 ENCOUNTER — Inpatient Hospital Stay: Attending: Oncology

## 2024-06-26 ENCOUNTER — Encounter: Payer: Self-pay | Admitting: Pain Medicine

## 2024-06-26 ENCOUNTER — Encounter: Payer: Self-pay | Admitting: Oncology

## 2024-06-26 ENCOUNTER — Ambulatory Visit (HOSPITAL_BASED_OUTPATIENT_CLINIC_OR_DEPARTMENT_OTHER): Admitting: Pain Medicine

## 2024-06-26 ENCOUNTER — Inpatient Hospital Stay: Admitting: Oncology

## 2024-06-26 VITALS — BP 101/64 | HR 80 | Temp 97.2°F | Resp 23 | Ht 63.0 in | Wt 337.0 lb

## 2024-06-26 VITALS — BP 115/80 | HR 76 | Temp 97.5°F | Resp 18 | Wt 338.2 lb

## 2024-06-26 VITALS — BP 92/51 | HR 76 | Resp 18

## 2024-06-26 DIAGNOSIS — E66813 Obesity, class 3: Secondary | ICD-10-CM | POA: Insufficient documentation

## 2024-06-26 DIAGNOSIS — G8929 Other chronic pain: Secondary | ICD-10-CM | POA: Insufficient documentation

## 2024-06-26 DIAGNOSIS — M19011 Primary osteoarthritis, right shoulder: Secondary | ICD-10-CM | POA: Insufficient documentation

## 2024-06-26 DIAGNOSIS — Z9884 Bariatric surgery status: Secondary | ICD-10-CM | POA: Diagnosis not present

## 2024-06-26 DIAGNOSIS — M19012 Primary osteoarthritis, left shoulder: Secondary | ICD-10-CM | POA: Diagnosis present

## 2024-06-26 DIAGNOSIS — Z91013 Allergy to seafood: Secondary | ICD-10-CM | POA: Insufficient documentation

## 2024-06-26 DIAGNOSIS — M25512 Pain in left shoulder: Secondary | ICD-10-CM | POA: Insufficient documentation

## 2024-06-26 DIAGNOSIS — D508 Other iron deficiency anemias: Secondary | ICD-10-CM

## 2024-06-26 DIAGNOSIS — Z6841 Body Mass Index (BMI) 40.0 and over, adult: Secondary | ICD-10-CM

## 2024-06-26 DIAGNOSIS — D509 Iron deficiency anemia, unspecified: Secondary | ICD-10-CM | POA: Diagnosis present

## 2024-06-26 DIAGNOSIS — M25511 Pain in right shoulder: Secondary | ICD-10-CM | POA: Insufficient documentation

## 2024-06-26 LAB — CBC WITH DIFFERENTIAL (CANCER CENTER ONLY)
Abs Immature Granulocytes: 0.04 K/uL (ref 0.00–0.07)
Basophils Absolute: 0.1 K/uL (ref 0.0–0.1)
Basophils Relative: 1 %
Eosinophils Absolute: 0.2 K/uL (ref 0.0–0.5)
Eosinophils Relative: 2 %
HCT: 40.8 % (ref 36.0–46.0)
Hemoglobin: 13 g/dL (ref 12.0–15.0)
Immature Granulocytes: 0 %
Lymphocytes Relative: 22 %
Lymphs Abs: 2.2 K/uL (ref 0.7–4.0)
MCH: 28.5 pg (ref 26.0–34.0)
MCHC: 31.9 g/dL (ref 30.0–36.0)
MCV: 89.5 fL (ref 80.0–100.0)
Monocytes Absolute: 0.5 K/uL (ref 0.1–1.0)
Monocytes Relative: 5 %
Neutro Abs: 7.3 K/uL (ref 1.7–7.7)
Neutrophils Relative %: 70 %
Platelet Count: 299 K/uL (ref 150–400)
RBC: 4.56 MIL/uL (ref 3.87–5.11)
RDW: 14.1 % (ref 11.5–15.5)
WBC Count: 10.4 K/uL (ref 4.0–10.5)
nRBC: 0 % (ref 0.0–0.2)

## 2024-06-26 LAB — IRON AND TIBC
Iron: 56 ug/dL (ref 28–170)
Saturation Ratios: 13 % (ref 10.4–31.8)
TIBC: 449 ug/dL (ref 250–450)
UIBC: 393 ug/dL

## 2024-06-26 LAB — VITAMIN B12: Vitamin B-12: 475 pg/mL (ref 180–914)

## 2024-06-26 LAB — FERRITIN: Ferritin: 32 ng/mL (ref 11–307)

## 2024-06-26 MED ORDER — ROPIVACAINE HCL 2 MG/ML IJ SOLN
9.0000 mL | Freq: Once | INTRAMUSCULAR | Status: AC
  Administered 2024-06-26: 9 mL via PERINEURAL

## 2024-06-26 MED ORDER — METHYLPREDNISOLONE ACETATE 80 MG/ML IJ SUSP
INTRAMUSCULAR | Status: AC
  Filled 2024-06-26: qty 2

## 2024-06-26 MED ORDER — FENTANYL CITRATE (PF) 100 MCG/2ML IJ SOLN
25.0000 ug | INTRAMUSCULAR | Status: DC | PRN
  Administered 2024-06-26: 50 ug via INTRAVENOUS

## 2024-06-26 MED ORDER — FENTANYL CITRATE (PF) 100 MCG/2ML IJ SOLN
INTRAMUSCULAR | Status: AC
  Filled 2024-06-26: qty 2

## 2024-06-26 MED ORDER — METHYLPREDNISOLONE ACETATE 80 MG/ML IJ SUSP
80.0000 mg | Freq: Once | INTRAMUSCULAR | Status: AC
  Administered 2024-06-26: 80 mg

## 2024-06-26 MED ORDER — IRON SUCROSE 20 MG/ML IV SOLN
200.0000 mg | Freq: Once | INTRAVENOUS | Status: AC
  Administered 2024-06-26: 200 mg via INTRAVENOUS
  Filled 2024-06-26: qty 10

## 2024-06-26 MED ORDER — PENTAFLUOROPROP-TETRAFLUOROETH EX AERO
INHALATION_SPRAY | Freq: Once | CUTANEOUS | Status: AC
  Administered 2024-06-26: 30 via TOPICAL

## 2024-06-26 MED ORDER — LIDOCAINE HCL 2 % IJ SOLN
INTRAMUSCULAR | Status: AC
  Filled 2024-06-26: qty 20

## 2024-06-26 MED ORDER — MIDAZOLAM HCL 5 MG/5ML IJ SOLN
0.5000 mg | Freq: Once | INTRAMUSCULAR | Status: AC
  Administered 2024-06-26: 2 mg via INTRAVENOUS

## 2024-06-26 MED ORDER — ROPIVACAINE HCL 2 MG/ML IJ SOLN
9.0000 mL | Freq: Once | INTRAMUSCULAR | Status: AC
  Administered 2024-06-26: 9 mL via INTRA_ARTICULAR

## 2024-06-26 MED ORDER — MIDAZOLAM HCL 5 MG/5ML IJ SOLN
INTRAMUSCULAR | Status: AC
  Filled 2024-06-26: qty 5

## 2024-06-26 MED ORDER — ROPIVACAINE HCL 2 MG/ML IJ SOLN
INTRAMUSCULAR | Status: AC
  Filled 2024-06-26: qty 20

## 2024-06-26 MED ORDER — METHYLPREDNISOLONE ACETATE 80 MG/ML IJ SUSP
80.0000 mg | Freq: Once | INTRAMUSCULAR | Status: AC
  Administered 2024-06-26: 80 mg via INTRA_ARTICULAR

## 2024-06-26 MED ORDER — LIDOCAINE HCL 2 % IJ SOLN
20.0000 mL | Freq: Once | INTRAMUSCULAR | Status: AC
  Administered 2024-06-26: 400 mg

## 2024-06-26 NOTE — Assessment & Plan Note (Signed)
#  Iron  deficiency anemia  Labs reviewed and discussed with patient Lab Results  Component Value Date   HGB 13.0 06/26/2024   TIBC 449 06/26/2024   IRONPCTSAT 13 06/26/2024   FERRITIN 32 06/26/2024     Patient has stable hemoglobin and iron  panel. Proceed with maintenance Venofer  200 mg x 1 today.

## 2024-06-26 NOTE — Patient Instructions (Signed)
 ______________________________________________________________________    Post-Procedure Discharge Instructions  INSTRUCTIONS Apply ice:  Purpose: This will minimize any swelling and discomfort after procedure.  When: Day of procedure, as soon as you get home. How: Fill a plastic sandwich bag with crushed ice. Cover it with a small towel and apply to injection site. How long: (15 min on, 15 min off) Apply for 15 minutes then remove x 15 minutes.  Repeat sequence on day of procedure, until you go to bed. Apply heat:  Purpose: To treat any soreness and discomfort from the procedure. When: Starting the next day after the procedure. How: Apply heat to procedure site starting the day following the procedure. How long: May continue to repeat daily, until discomfort goes away. Food intake: Start with clear liquids (like water) and advance to regular food, as tolerated.  Physical activities: Keep activities to a minimum for the first 8 hours after the procedure. After that, then as tolerated. Driving: If you have received any sedation, be responsible and do not drive. You are not allowed to drive for 24 hours after having sedation. Blood thinner: (Applies only to those taking blood thinners) You may restart your blood thinner 6 hours after your procedure. Insulin: (Applies only to Diabetic patients taking insulin) As soon as you can eat, you may resume your normal dosing schedule. Infection prevention: Keep procedure site clean and dry. Shower daily and clean area with soap and water.  PAIN DIARY Post-procedure Pain Diary: Extremely important that this be done correctly and accurately. Recorded information will be used to determine the next step in treatment. For the purpose of accuracy, follow these rules: Evaluate only the area treated. Do not report or include pain from an untreated area. For the purpose of this evaluation, ignore all other areas of pain, except for the treated area. After your  procedure, avoid taking a long nap and attempting to complete the pain diary after you wake up. Instead, set your alarm clock to go off every hour, on the hour, for the initial 8 hours after the procedure. Document the duration of the numbing medicine, and the relief you are getting from it. Do not go to sleep and attempt to complete it later. It will not be accurate. If you received sedation, it is likely that you were given a medication that may cause amnesia. Because of this, completing the diary at a later time may cause the information to be inaccurate. This information is needed to plan your care. Follow-up appointment: Keep your post-procedure follow-up evaluation appointment after the procedure (usually 2 weeks for most procedures, 6 weeks for radiofrequencies). DO NOT FORGET to bring you pain diary with you.   EXPECT... (What should I expect to see with my procedure?) From numbing medicine (AKA: Local Anesthetics): Numbness or decrease in pain. You may also experience some weakness, which if present, could last for the duration of the local anesthetic. Onset: Full effect within 15 minutes of injected. Duration: It will depend on the type of local anesthetic used. On the average, 1 to 8 hours.  From steroids (Applies only if steroids were used): Decrease in swelling or inflammation. Once inflammation is improved, relief of the pain will follow. Onset of benefits: Depends on the amount of swelling present. The more swelling, the longer it will take for the benefits to be seen. In some cases, up to 10 days. Duration: Steroids will stay in the system x 2 weeks. Duration of benefits will depend on multiple posibilities including persistent irritating  factors. Side-effects: If present, they may typically last 2 weeks (the duration of the steroids). Frequent: Cramps (if they occur, drink Gatorade and take over-the-counter Magnesium 450-500 mg once to twice a day); water retention with temporary weight  gain; increases in blood sugar; decreased immune system response; increased appetite. Occasional: Facial flushing (red, warm cheeks); mood swings; menstrual changes. Uncommon: Long-term decrease or suppression of natural hormones; bone thinning. (These are more common with higher doses or more frequent use. This is why we prefer that our patients avoid having any injection therapies in other practices.)  Very Rare: Severe mood changes; psychosis; aseptic necrosis. From procedure: Some discomfort is to be expected once the numbing medicine wears off. This should be minimal if ice and heat are applied as instructed.  CALL IF... (When should I call?) You experience numbness and weakness that gets worse with time, as opposed to wearing off. New onset bowel or bladder incontinence. (Applies only to procedures done in the spine)  Emergency Numbers: Durning business hours (Monday - Thursday, 8:00 AM - 4:00 PM) (Friday, 9:00 AM - 12:00 Noon): [phone] After hours: [phone] NOTE: If you are having a problem and are unable connect with, or to talk to a provider, then go to your nearest urgent care or emergency department. If the problem is serious and urgent, please call 911. ______________________________________________________________________     ______________________________________________________________________    Steroid injections  Common steroids for injections Triamcinolone : Used by many sports medicine physicians for large joint and bursal injections, often combined with a local anesthetic like lidocaine . A study focusing on coccydynia (tailbone pain) found triamcinolone  was more effective than betamethasone, suggesting it may also be preferable for other localized inflammation conditions. Methylprednisolone: A common alternative to triamcinolone  that is also a strong anti-inflammatory. It is available in different formulations, with the acetate suspension being the long-acting  option for intra-articular injections. Dexamethasone : This is a non-particulate steroid, meaning it has a lower risk of tissue damage compared to particulate steroids like triamcinolone  and methylprednisolone. While less common for this specific use, it is an option for targeted injections.   Considerations for physicians Particulate vs. non-particulate steroids: Triamcinolone  and methylprednisolone are particulate, meaning they can clump together. Dexamethasone  is non-particulate. Particulate steroids are often preferred for their longer-lasting effects but carry a theoretical higher risk for certain injections (though this is less of a concern in the costochondral joints). Combined injectate: Corticosteroids are typically mixed with a local anesthetic like lidocaine  to provide both immediate pain relief (from the anesthetic) and longer-term inflammation reduction (from the steroid). Imaging guidance: To ensure accurate placement of the needle and medication, physicians may use ultrasound or fluoroscopic guidance for the injection, especially in complex or refractory cases.   Patient guidance Before undergoing a steroid injection, discuss the options with your physician. They will determine the best steroid, dosage, and procedure for your specific case based on factors like: Severity of your condition History of response to other treatments Your overall health status Experience and preference of the physician  Last  Updated: 05/08/2024 ______________________________________________________________________

## 2024-06-26 NOTE — Progress Notes (Signed)
 PROVIDER NOTE: Interpretation of information contained herein should be left to medically-trained personnel. Specific patient instructions are provided elsewhere under Patient Instructions section of medical record. This document was created in part using STT-dictation technology, any transcriptional errors that may result from this process are unintentional.  Patient: Karen Lewis Type: Established DOB: [birth year] MRN: [id number] PCP: [name], MD  Service: Procedure DOS: 06/26/2024 Setting: Ambulatory Location: Ambulatory outpatient facility Delivery: Face-to-face Provider: Eric DELENA Como, MD Specialty: Interventional Pain Management Specialty designation: 09 Location: Outpatient facility Ref. Prov.: [name], MD       Interventional Therapy   Procedure: Glenohumeral and acromioclavicular joint Injection L3R1  Laterality: Bilateral (-50)  Level: Shoulder  Target: Glenohumeral and acromioclavicular joint Location: Intra-articular  Region: Entire Shoulder Area Approach: Anterolateral approach. Type of procedure: Percutaneous joint injection   Imaging: Fluoroscopy-guided Non-spinal ([id number]) Anesthesia: Local anesthesia (1-2% Lidocaine ) Anxiolysis: IV Versed   2.0 mg Sedation: Minimal Sedation Fentanyl  1 mL (50 mcg) DOS: 06/26/2024  Performed by: Eric DELENA Como, MD  Purpose: Diagnostic/Therapeutic Indications: Shoulder pain severe enough to impact quality of life or function. Rationale (medical necessity): procedure needed and proper for the diagnosis and/or treatment of Karen Lewis's medical symptoms and needs. 1. Chronic shoulder pain (Bilateral)   2. Acromioclavicular joint pain (Bilateral)   3. Osteoarthritis of shoulder (Left)   4. Osteoarthritis of glenohumeral joint (Left)   5. Osteoarthritis of AC (acromioclavicular) joint (Left)   6. Osteoarthritis of shoulder (Right)   7. Osteoarthritis of glenohumeral joint (Right)   8. Osteoarthritis of  AC (acromioclavicular) joint (Right)   9. H/O allergy to shellfish   10. Class 3 severe obesity due to excess calories with serious comorbidity and body mass index (BMI) of 60.0 to 69.9 in adult [facility])    NAS-11 Pain score:   Pre-procedure: 6  (left 6, right r)/10   Post-procedure: 0-No pain/10       Position  Prep  Materials:  Position: Supine Prep solution: ChloraPrep (2% chlorhexidine  gluconate and 70% isopropyl alcohol ) Prep Area: Entire shoulder Area Materials:  Tray: Block Needle(s):  Type: Spinal  Gauge (G): 22  Length: 3.5-in  Qty: 2  H&P (Pre-op Assessment):  Karen Lewis is a 53 y.o. (year old), female patient, seen today for interventional treatment. She  has a past surgical history that includes Laparoscopic partial gastrectomy; Shoulder arthroscopy (Right); Carpal tunnel release (Bilateral); Diagnostic laparoscopy; Cholecystectomy; Trigger finger release (Right); Thyroidectomy (N/A, 11/12/2015); left trigger finger; Roux-en-Y Gastric Bypass (06/03/2017); Hiatal hernia repair; peniculectomy (N/A, 07/05/2018); Total hip arthroplasty (Right, 11/27/2019); Joint replacement (Bilateral, hip); Appendectomy; Trigger finger release (Right, 04/24/2020); Colonoscopy with propofol  (N/A, 02/11/2021); Esophagogastroduodenoscopy (egd) with propofol  (N/A, 02/11/2021); Xi robotic laparoscopic assisted appendectomy (N/A, 05/12/2024); Laparoscopic lysis of adhesions (N/A, 05/12/2024); and Ventral hernia repair (N/A, 05/16/2024). [name] has a current medication list which includes the following prescription(s): acidophilus, albuterol , albuterol , amitriptyline , butalbital -acetaminophen -caffeine , calcium  carbonate, cephalexin , cephalexin , clonazepam , cyanocobalamin , diclofenac  sodium, diphenhydramine , duloxetine , dupilumab, epinephrine , ergocalciferol , estradiol , famotidine , fluticasone  furoate-vilanterol, emgality, levocetirizine, levothyroxine , lisinopril , magnesium  oxide, meclizine , metformin , montelukast ,  tab-a-vite, naloxone , naphazoline-pheniramine, nystatin , omeprazole, oxycodone , [START ON 07/25/2024] oxycodone , [START ON 08/24/2024] oxycodone , ozempic (0.25 or 0.5 mg/dose), pregabalin , progesterone , rizatriptan, rosuvastatin , tizanidine , topiramate , vitamin k, hydrochlorothiazide, ketorolac , metoprolol  tartrate, and quetiapine , and the following Facility-Administered Medications: fentanyl . Her primarily concern today is the Shoulder Pain (bilateral)  Initial Vital Signs:  Pulse/HCG Rate: 80ECG Heart Rate: 78 Temp: (!) 97.3 F (36.3 C) Resp: 18 BP: (!) 126/91 SpO2: 96 %  BMI: Estimated body mass  index is 59.7 kg/m as calculated from the following:   Height as of this encounter: 5' 3 (1.6 m).   Weight as of this encounter: 337 lb (152.9 kg).  Risk Assessment: Allergies: Reviewed. She is allergic to bactrim [sulfamethoxazole-trimethoprim], omalizumab, ciprofloxacin, shellfish allergy, and nsaids.  Allergy Precautions: None required Coagulopathies: Reviewed. None identified.  Blood-thinner therapy: None at this time Active Infection(s): Reviewed. None identified. Karen Lewis is afebrile  Site Confirmation: Karen Lewis was asked to confirm the procedure and laterality before marking the site Procedure checklist: Completed Consent: Before the procedure and under the influence of no sedative(s), amnesic(s), or anxiolytics, the patient was informed of the treatment options, risks and possible complications. To fulfill our ethical and legal obligations, as recommended by the American Medical Association's Code of Ethics, I have informed the patient of my clinical impression; the nature and purpose of the treatment or procedure; the risks, benefits, and possible complications of the intervention; the alternatives, including doing nothing; the risk(s) and benefit(s) of the alternative treatment(s) or procedure(s); and the risk(s) and benefit(s) of doing nothing. The patient was provided information  about the general risks and possible complications associated with the procedure. These may include, but are not limited to: failure to achieve desired goals, infection, bleeding, organ or nerve damage, allergic reactions, paralysis, and death. In addition, the patient was informed of those risks and complications associated to the procedure, such as failure to decrease pain; infection; bleeding; organ or nerve damage with subsequent damage to sensory, motor, and/or autonomic systems, resulting in permanent pain, numbness, and/or weakness of one or several areas of the body; allergic reactions; (i.e.: anaphylactic reaction); and/or death. Furthermore, the patient was informed of those risks and complications associated with the medications. These include, but are not limited to: allergic reactions (i.e.: anaphylactic or anaphylactoid reaction(s)); adrenal axis suppression; blood sugar elevation that in diabetics may result in ketoacidosis or comma; water  retention that in patients with history of congestive heart failure may result in shortness of breath, pulmonary edema, and decompensation with resultant heart failure; weight gain; swelling or edema; medication-induced neural toxicity; particulate matter embolism and blood vessel occlusion with resultant organ, and/or nervous system infarction; and/or aseptic necrosis of one or more joints. Finally, the patient was informed that Medicine is not an exact science; therefore, there is also the possibility of unforeseen or unpredictable risks and/or possible complications that may result in a catastrophic outcome. The patient indicated having understood very clearly. We have given the patient no guarantees and we have made no promises. Enough time was given to the patient to ask questions, all of which were answered to the patient's satisfaction. Ms. Reifsteck has indicated that she wanted to continue with the procedure. Attestation: I, the ordering provider, attest  that I have discussed with the patient the benefits, risks, side-effects, alternatives, likelihood of achieving goals, and potential problems during recovery for the procedure that I have provided informed consent. Date  Time: 06/26/2024 11:25 AM   Imaging Guidance (Non-Spinal):          Type of Imaging Technique: Fluoroscopy Guidance (Non-Spinal) Indication(s): Fluoroscopy guidance for needle placement to enhance accuracy in procedures requiring precise needle localization for targeted delivery of medication in or near specific anatomical locations not easily accessible without such real-time imaging assistance. Exposure Time: Please see nurses notes. Contrast: None used. Fluoroscopic Guidance: I was personally present during the use of fluoroscopy. Tunnel Vision Technique used to obtain the best possible view of the target area. Parallax  error corrected before commencing the procedure. Direction-depth-direction technique used to introduce the needle under continuous pulsed fluoroscopy. Once target was reached, antero-posterior, oblique, and lateral fluoroscopic projection used confirm needle placement in all planes. Images permanently stored in EMR. Interpretation: No contrast injected. I personally interpreted the imaging intraoperatively. Adequate needle placement confirmed in multiple planes. Permanent images saved into the patient's record.  Pre-Procedure Preparation:  Monitoring: As per clinic protocol. Respiration, ETCO2, SpO2, BP, heart rate and rhythm monitor placed and checked for adequate function Safety Precautions: Patient was assessed for positional comfort and pressure points before starting the procedure. Time-out: I initiated and conducted the Time-out before starting the procedure, as per protocol. The patient was asked to participate by confirming the accuracy of the Time Out information. Verification of the correct person, site, and procedure were performed and confirmed  by me, the nursing staff, and the patient. Time-out conducted as per Joint Commission's Universal Protocol (UP.01.01.01). Time: 1156 Start Time: 1156 hrs.  Description  Narrative of Procedure:          Rationale (medical necessity): procedure needed and proper for the diagnosis and/or treatment of the patient's medical symptoms and needs. Procedural Technique Safety Precautions: Aspiration looking for blood return was conducted prior to all injections. At no point did we inject any substances, as a needle was being advanced. No attempts were made at seeking any paresthesias. Safe injection practices and needle disposal techniques used. Medications properly checked for expiration dates. SDV (single dose vial) medications used. Description of the Procedure: Protocol guidelines were followed. The patient was placed in position over the procedure table. The target area was identified and the area prepped in the usual manner. Skin & deeper tissues infiltrated with local anesthetic. Appropriate amount of time allowed to pass for local anesthetics to take effect. The procedure needles were then advanced to the target area. Proper needle placement secured. Negative aspiration confirmed. Solution injected in intermittent fashion, asking for systemic symptoms every 0.5cc of injectate. The needles were then removed and the area cleansed, making sure to leave some of the prepping solution back to take advantage of its long term bactericidal properties.             Vitals:   06/26/24 1206 06/26/24 1217 06/26/24 1225 06/26/24 1232  BP: 113/71 (!) 112/96 126/84 101/64  Pulse:      Resp: 15 (!) 28 16 (!) 23  Temp:  (!) 97.1 F (36.2 C)  (!) 97.2 F (36.2 C)  TempSrc:  Temporal  Temporal  SpO2: 96% 94% 96% 95%  Weight:      Height:         Start Time: 1156 hrs. End Time: 1205 hrs.  Antibiotic Prophylaxis:   Anti-infectives (From admission, onward)    None      Indication(s): None  identified  Post-operative Assessment:  Post-procedure Vital Signs:  Pulse/HCG Rate: 8081 Temp: (!) 97.2 F (36.2 C) Resp: (!) 23 BP: 101/64 SpO2: 95 %  EBL: None  Complications: No immediate post-treatment complications observed by team, or reported by patient.  Note: The patient tolerated the entire procedure well. A repeat set of vitals were taken after the procedure and the patient was kept under observation following institutional policy, for this type of procedure. Post-procedural neurological assessment was performed, showing return to baseline, prior to discharge. The patient was provided with post-procedure discharge instructions, including a section on how to identify potential problems. Should any problems arise concerning this procedure, the patient was given instructions  to immediately contact us , at any time, without hesitation. In any case, we plan to contact the patient by telephone for a follow-up status report regarding this interventional procedure.  Comments:  No additional relevant information.  Plan of Care (POC)  Orders:  Orders Placed This Encounter  Procedures   SHOULDER INJECTION    Scheduling Instructions:     Procedure: Intra-articular shoulder (Glenohumeral) joint and (AC) Acromioclavicular joint injection     Side: Bilateral     Level: Glenohumeral joint and (AC) Acromioclavicular joint     Sedation: Patient's choice.     Date: 06/26/2024    Where will this procedure be performed?:   [facility]   DG [facility] C-ARM 1-60 MIN NO REPORT    Intraoperative interpretation by procedural physician at [facility].    Standing Status:   Standing    Number of Occurrences:   1    Reason for exam::   Assistance in needle guidance and placement for procedures requiring needle placement in or near specific anatomical locations not easily accessible without such assistance.   Informed Consent Details: Physician/Practitioner Attestation;  Transcribe to consent form and obtain patient signature    Note: Always confirm laterality of pain with Ms. Loreli, before procedure.    Physician/Practitioner attestation of informed consent for procedure/surgical case:   I, the physician/practitioner, attest that I have discussed with the patient the benefits, risks, side effects, alternatives, likelihood of achieving goals and potential problems during recovery for the procedure that I have provided informed consent.    Procedure:   Intra-articular shoulder joint injection under fluoroscopic guidance    Physician/Practitioner performing the procedure:   Garhett Bernhard A. Tanya, MD    Indication/Reason:   Chronic shoulder pain secondary to shoulder arthropathy   Provide equipment / supplies at bedside    Procedure tray: Block Tray (Disposable  single use) Skin infiltration needle: Regular 1.5-in, 25-G, (x1) Block Needle type: Spinal Amount/quantity: 2 Size: Regular (3.5-inch) Gauge: 22G    Standing Status:   Standing    Number of Occurrences:   1    Specify:   Block Tray   Saline lock IV    Have LR [phone] mL available and administer at 125 mL/hr if patient becomes hypotensive.    Standing Status:   Standing    Number of Occurrences:   1   Miscellanous precautions    NOTE: Although It is true that patients can have allergies to shellfish and that shellfish contain iodine, most shellfish  allergies are due to two protein allergens present in the shellfish: tropomyosins and parvalbumin. Not all patients with shellfish allergies are allergic to iodine. However, as a precaution, avoid using iodine containing products.    Standing Status:   Standing    Number of Occurrences:   1   Skin care precautions    Patient indicates being allergic to a specific skin prepping solution. Please avoid.    Standing Status:   Standing    Number of Occurrences:   1     Opioid Analgesic: Oxycodone  IR 5 mg 1 tab PO TID (#90/mo) (15 mg/day) MME/day: 22.5  mg/day.    Medications ordered for procedure: Meds ordered this encounter  Medications   lidocaine  (XYLOCAINE ) 2 % (with pres) injection 400 mg   pentafluoroprop-tetrafluoroeth (GEBAUERS) aerosol   midazolam  (VERSED ) 5 MG/5ML injection 0.5-2 mg    Make sure Flumazenil is available in the pyxis when using this medication. If oversedation occurs, administer 0.2 mg IV  over 15 sec. If after 45 sec no response, administer 0.2 mg again over 1 min; may repeat at 1 min intervals; not to exceed 4 doses (1 mg)   fentaNYL  (SUBLIMAZE ) injection 25-50 mcg    Make sure Narcan  is available in the pyxis when using this medication. In the event of respiratory depression (RR< 8/min): Titrate NARCAN  (naloxone ) in increments of 0.1 to 0.2 mg IV at 2-3 minute intervals, until desired degree of reversal.   ropivacaine  (PF) 2 mg/mL (0.2%) (NAROPIN ) injection 9 mL   methylPREDNISolone  acetate (DEPO-MEDROL ) injection 80 mg   ropivacaine  (PF) 2 mg/mL (0.2%) (NAROPIN ) injection 9 mL   methylPREDNISolone  acetate (DEPO-MEDROL ) injection 80 mg   Medications administered: We administered lidocaine , pentafluoroprop-tetrafluoroeth, midazolam , fentaNYL , ropivacaine  (PF) 2 mg/mL (0.2%), methylPREDNISolone  acetate, ropivacaine  (PF) 2 mg/mL (0.2%), and methylPREDNISolone  acetate.  See the medical record for exact dosing, route, and time of administration.    Interventional Therapies  Risk  Complexity Considerations:   Estimated body mass index is 60.59 kg/m as calculated from the following:   Height as of 06/18/21: 5' 4 (1.626 m).   Weight as of 06/18/21: 353 lb (160.1 kg).     NOTE: NO RFA until BMI less or equal to 35.  (Gastric bypass done on 06/03/2017) Iodine allergy  Contrast dye allergy  Shellfish allergy    Planned  Pending:   Therapeutic bilateral IA shoulder (glenohumeral + AC) joint inj. L3R1    Under consideration:   Diagnostic/therapeutic left IA shoulder (glenohumeral + AC) joint inj. #2 (first of  2025)    Completed:   Diagnostic/therapeutic left IA shoulder (glenohumeral + AC) joint inj. x2 (first of 2025)  Therapeutic right L2-3 LESI x1 (04/29/2022) (100/100/60/60)  Therapeutic bilateral IA Monovisc knee inj. x1 (05/25/2022)  Therapeutic right trapezius TPI/MNB x1 (05/25/2022)  Therapeutic bilateral IA Zilretta  knee inj. x4 (03/25/2022) (100/100/80/80)  Therapeutic bilateral IA Hyalgan knee inj. x16 (06/24/2020) (100/100/60/60)  Palliative/therapeutic bilateral lumbar facet MBB x9 (07/07/2023) (100/100/70/70)  Therapeutic right IA hip injection x3 (07/07/2017) (100/50/25/>50)  Therapeutic left IA shoulder (glenohumeral joint) injection x1 (05/31/2017) (50/0/100/100)  (07/28/2023) referral to interventional pain management practice for bilateral lumbar facet RFA    Completed by other providers:   Therapeutic bilateral THR (total hip replacements) (Dr. Kathlynn) (Right: 11/27/2019) (Left: 07/29/2015)    Therapeutic  Palliative (PRN) options:   Palliative bilateral knee injections  Palliative bilateral lumbar facet MBBs    Pharmacotherapy  Nonopioid transferred 06/24/2020: Lyrica        Follow-up plan:   Return in about 2 weeks (around 07/10/2024) for (Face2F), (PPE).     Recent Visits Date Type Provider Dept  06/12/24 Office Visit Patel, Seema K, NP [facility]  04/30/24 Office Visit Tanya Glisson, MD [facility]  04/19/24 Office Visit Patel, Seema K, NP [facility]  04/03/24 Office Visit Tanya Glisson, MD [facility]  Showing recent visits within past 90 days and meeting all other requirements Today's Visits Date Type Provider Dept  06/26/24 Procedure visit Tanya Glisson, MD [facility]  Showing today's visits and meeting all other requirements Future Appointments Date Type Provider Dept  08/30/24 Appointment Patel, Seema K, NP [facility]  Showing future appointments within next 90 days and meeting  all other requirements   Disposition: Discharge home  Discharge (Date  Time): 06/26/2024; 1236 hrs.   Primary Care Physician: [name], MD Location: [facility] Note by: Glisson DELENA Tanya, MD (TTS technology used. I  apologize for any typographical errors that were not detected and corrected.) Date: 06/26/2024; Time: 12:45 PM  Disclaimer:  Medicine is not an Visual merchandiser. The only guarantee in medicine is that nothing is guaranteed. It is important to note that the decision to proceed with this intervention was based on the information collected from the patient. The Data and conclusions were drawn from the patient's questionnaire, the interview, and the physical examination. Because the information was provided in large part by the patient, it cannot be guaranteed that it has not been purposely or unconsciously manipulated. Every effort has been made to obtain as much relevant data as possible for this evaluation. It is important to note that the conclusions that lead to this procedure are derived in large part from the available data. Always take into account that the treatment will also be dependent on availability of resources and existing treatment guidelines, considered by other Pain Management Practitioners as being common knowledge and practice, at the time of the intervention. For Medico-Legal purposes, it is also important to point out that variation in procedural techniques and pharmacological choices are the acceptable norm. The indications, contraindications, technique, and results of the above procedure should only be interpreted and judged by a Board-Certified Interventional Pain Specialist with extensive familiarity and expertise in the same exact procedure and technique.

## 2024-06-26 NOTE — Progress Notes (Signed)
 Hematology/Oncology Progress note [phone] [phone]    CHIEF COMPLAINTS/REASON FOR VISIT:  Follow up  iron  deficiency anemia  ASSESSMENT & PLAN:   Iron  deficiency anemia, unspecified #Iron  deficiency anemia  Labs reviewed and discussed with patient Lab Results  Component Value Date   HGB 13.0 06/26/2024   TIBC 449 06/26/2024   IRONPCTSAT 13 06/26/2024   FERRITIN 32 06/26/2024     Patient has stable hemoglobin and iron  panel. Proceed with maintenance Venofer  200 mg x 1 today.  History of gastric bypass B12 is pending Continue B12 oral supplementation 2 times per week   Orders Placed This Encounter  Procedures   Vitamin B12    Standing Status:   Future    Expected Date:   12/25/2024    Expiration Date:   01/25/2025   Ferritin    Standing Status:   Future    Expected Date:   12/25/2024    Expiration Date:   01/25/2025   Iron  and TIBC    Standing Status:   Future    Expected Date:   12/25/2024    Expiration Date:   01/25/2025   CBC with Differential (Cancer Center Only)    Standing Status:   Future    Expected Date:   12/25/2024    Expiration Date:   01/25/2025   Follow-up in 6 months. All questions were answered. The patient knows to call the clinic with any problems, questions or concerns.  Zelphia Cap, MD, PhD [facility] Hematology Oncology 06/26/2024    HISTORY OF PRESENTING ILLNESS:  KARLA PAVONE is a  53 y.o.  female with PMH listed below was seen in consultation at the request of [name], MD   for evaluation of iron  deficiency anemia.   Reviewed patient's recent labs  Labs revealed anemia with hemoglobin of .   Reviewed patient's previous labs  05/16/2019 CBC showed hemoglobin 9.4, WBC 11.4, MCV 68.7, platelet count 352,000. Iron  panel showed ferritin of 8, iron  34, folate 19.3, No aggravating or improving factors.  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  She follows up with ID [name].  +Chronic lower extremity swelling/lymphedema. Moved from [place]  years ago.  Previously worked as a Engineer, civil (consulting).  INTERVAL HISTORY FATMA RUTTEN is a 53 y.o. female who has above history reviewed by me today presents for follow up visit for management of iron  deficiency anemia  Discussed the use of AI scribe software for clinical note transcription with the patient, who gave verbal consent to proceed.   Patient receives maintenance Venofer  every 6 months. Patient has chronic fatigue.   During the interval, patient was hospitalized from 05/12/2024 - 05/21/2024. She was diagnosed with acute appendicitis status post robotic appendectomy on 05/12/2024.   Postop, patient developed ileus, nausea vomiting.  Repeat CT of the abdomen and pelvis suggested bowel obstruction and umbilical hernia. Patient was taken back to the OR.  Her hemoglobin dropped to 10.9 during hospitalization.  She made a very good recovery    Review of Systems  Constitutional:  Positive for fatigue. Negative for appetite change, chills and fever.  HENT:   Negative for hearing loss and voice change.   Eyes:  Negative for eye problems.  Respiratory:  Negative for chest tightness, cough and shortness of breath.   Cardiovascular:  Positive for leg swelling. Negative for chest pain.  Gastrointestinal:  Negative for abdominal distention, abdominal pain and blood in  stool.  Endocrine: Negative for hot flashes.  Genitourinary:  Negative for difficulty urinating and frequency.   Musculoskeletal:  Positive for arthralgias and back pain.  Skin:  Negative for itching and rash.  Neurological:  Negative for extremity weakness.  Hematological:  Negative for adenopathy.  Psychiatric/Behavioral:  Negative for confusion.     MEDICAL HISTORY:  Past Medical History:  Diagnosis Date   Anemia    Anginal pain    Anxiety    Arthralgia of hip 07/29/2015   Arthritis    Arthritis, degenerative  07/29/2015   Asthma    Cephalalgia 07/25/2014   Dependence on unknown drug (HCC)    multiplt controlled drug dependence   Depression    Diabetes mellitus without complication (HCC)    Difficult intubation    per pt needs small tube (has had abrasions from tube in past)   Dysrhythmia    PVC's   Eczema    Fibromyalgia    Gastritis    GERD (gastroesophageal reflux disease)    Gonalgia 07/29/2015   Overview:  Overview:  The patient has had bilateral intra-articular Hyalgan injections done on 07/16/2014 and although she seems to do well with this type of therapy, apparently her insurance company does not want to pay for they Hyalgan. On 11/27/2014 the patient underwent a bilateral genicular nerve block with excellent results. On 01/28/2015 she had a right knee genicular radiofrequency ablatio   Gout    H/O cardiovascular disorder 03/10/2015   H/O surgical procedure 12/05/2012   Overview:  LSG ([facility] - April 2013)     H/O thyroid  disease 03/10/2015   Headache    Herpes    History of artificial joint 07/29/2015   History of hiatal hernia    Hypertension    Hypomagnesemia    Hypothyroidism    IDA (iron  deficiency anemia) 05/28/2019   LBP (low back pain) 07/29/2015   Neuromuscular disorder (HCC)    Obesity    PCOS (polycystic ovarian syndrome)    Primary osteoarthritis of both knees 07/29/2015   Sleep apnea    not using a Cpap machine at this time - most recent test mild apnea does not qualify for test (not OSA)   Thyroid  nodule bilateral   Umbilical hernia     SURGICAL HISTORY: Past Surgical History:  Procedure Laterality Date   APPENDECTOMY     CARPAL TUNNEL RELEASE Bilateral    CHOLECYSTECTOMY     COLONOSCOPY WITH PROPOFOL  N/A 02/11/2021   Procedure: COLONOSCOPY WITH PROPOFOL ;  Surgeon: Toledo, Ladell POUR, MD;  Location: [facility];  Service: Gastroenterology;  Laterality: N/A;   DIAGNOSTIC LAPAROSCOPY     ESOPHAGOGASTRODUODENOSCOPY (EGD) WITH PROPOFOL  N/A 02/11/2021    Procedure: ESOPHAGOGASTRODUODENOSCOPY (EGD) WITH PROPOFOL ;  Surgeon: Toledo, Ladell POUR, MD;  Location: [facility];  Service: Gastroenterology;  Laterality: N/A;   HIATAL HERNIA REPAIR     JOINT REPLACEMENT Bilateral hip   LAPAROSCOPIC LYSIS OF ADHESIONS N/A 05/12/2024   Procedure: LYSIS, ADHESIONS, LAPAROSCOPIC;  Surgeon: Evonnie Dorothyann LABOR, DO;  Location: [facility];  Service: General;  Laterality: N/A;   LAPAROSCOPIC PARTIAL GASTRECTOMY     left trigger finger     peniculectomy N/A 07/05/2018   ROUX-EN-Y GASTRIC BYPASS  06/03/2017   SHOULDER ARTHROSCOPY Right    THYROIDECTOMY N/A 11/12/2015   Procedure: THYROIDECTOMY;  Surgeon: Deward Dolly, MD;  Location: [facility];  Service: ENT;  Laterality: N/A;   TOTAL HIP ARTHROPLASTY Right 11/27/2019   Procedure: TOTAL HIP ARTHROPLASTY ANTERIOR APPROACH;  Surgeon: Kathlynn Sharper,  MD;  Location: [facility];  Service: Orthopedics;  Laterality: Right;   TRIGGER FINGER RELEASE Right    TRIGGER FINGER RELEASE Right 04/24/2020   Procedure: Right ring and middle trigger release;  Surgeon: Kathlynn Sharper, MD;  Location: [facility];  Service: Orthopedics;  Laterality: Right;   VENTRAL HERNIA REPAIR N/A 05/16/2024   Procedure: REPAIR, HERNIA, VENTRAL;  Surgeon: Evonnie Dorothyann LABOR, DO;  Location: [facility];  Service: General;  Laterality: N/A;   XI ROBOTIC LAPAROSCOPIC ASSISTED APPENDECTOMY N/A 05/12/2024   Procedure: APPENDECTOMY, ROBOT-ASSISTED, LAPAROSCOPIC;  Surgeon: Evonnie Dorothyann LABOR, DO;  Location: [facility];  Service: General;  Laterality: N/A;    SOCIAL HISTORY: Social History   Socioeconomic History   Marital status: Married    Spouse name: Not on file   Number of children: 0   Years of education: 12   Highest education level: High school graduate  Occupational History   Occupation: Disabled  Tobacco Use   Smoking status: Never   Smokeless tobacco: Never  Vaping Use   Vaping status: Never Used  Substance and Sexual Activity   Alcohol  use: No     Alcohol /week: 0.0 standard drinks of alcohol    Drug use: No   Sexual activity: Not Currently    Birth control/protection: Injection  Other Topics Concern   Not on file  Social History Narrative   Not on file   Social Drivers of Health   Financial Resource Strain: Medium Risk (06/15/2024)   Received from [facility]   Overall Financial Resource Strain (CARDIA)    Difficulty of Paying Living Expenses: Somewhat hard  Food Insecurity: Food Insecurity Present (06/15/2024)   Received from [facility]   Hunger Vital Sign    Within the past 12 months, you worried that your food would run out before you got the money to buy more.: Sometimes true    Within the past 12 months, the food you bought just didn't last and you didn't have money to get more.: Never true  Transportation Needs: Unmet Transportation Needs (06/15/2024)   Received from [facility] - Transportation    In the past 12 months, has lack of transportation kept you from medical appointments or from getting medications?: Yes    Lack of Transportation (Non-Medical): No  Physical Activity: Inactive (06/15/2024)   Received from [facility]   Exercise Vital Sign    On average, how many days per week do you engage in moderate to strenuous exercise (like a brisk walk)?: 0 days    On average, how many minutes do you engage in exercise at this level?: 0 min  Stress: Stress Concern Present (06/15/2024)   Received from [facility] - Occupational Stress Questionnaire    Feeling of Stress : To some extent  Social Connections: Socially Integrated (06/15/2024)   Received from [facility]   Social Connection and Isolation Panel    In a typical week, how many times do you talk on the phone with family, friends, or neighbors?: More than three times a week    How often do you get  together with friends or relatives?: More than three times a week    How often do you attend church or religious services?: More than 4 times per year    Do you belong to any clubs or organizations such as church groups, unions, fraternal or athletic groups, or school  groups?: No    How often do you attend meetings of the clubs or organizations you belong to?: More than 4 times per year    Are you married, widowed, divorced, separated, never married, or living with a partner?: Married  Intimate Partner Violence: Not At Risk (05/12/2024)   Humiliation, Afraid, Rape, and Kick questionnaire    Fear of Current or Ex-Partner: No    Emotionally Abused: No    Physically Abused: No    Sexually Abused: No    FAMILY HISTORY: Family History  Problem Relation Age of Onset   Anxiety disorder Mother    Depression Mother    Alcohol  abuse Mother    Diabetes Mother    Hypertension Mother    Kidney cancer Mother    Sleep apnea Mother    Alcohol  abuse Father    Anxiety disorder Father    Depression Father    Post-traumatic stress disorder Father    Kidney failure Father    COPD Father    Diabetes Father    Hypertension Father    Sleep apnea Father    Depression Brother    Diabetes Brother    Hypertension Brother    Sleep apnea Brother    Breast cancer Paternal Aunt 15   Breast cancer Paternal Aunt 38   Brain cancer Paternal Uncle 65   Bladder Cancer Neg Hx    Prostate cancer Neg Hx     ALLERGIES:  is allergic to bactrim [sulfamethoxazole-trimethoprim], omalizumab, ciprofloxacin, shellfish allergy, and nsaids.  MEDICATIONS:  Current Outpatient Medications  Medication Sig Dispense Refill   acidophilus (RISAQUAD) CAPS capsule Take 1 capsule by mouth daily.     albuterol  (PROVENTIL  HFA;VENTOLIN  HFA) 108 (90 BASE) MCG/ACT inhaler Inhale 2 puffs into the lungs every 6 (six) hours as needed for wheezing or shortness of breath.     albuterol  (PROVENTIL ) (2.5 MG/3ML) 0.083% nebulizer solution  Take 2.5 mg by nebulization every 6 (six) hours as needed for wheezing or shortness of breath.     amitriptyline  (ELAVIL ) 100 MG tablet Take 100 mg by mouth at bedtime.     butalbital -acetaminophen -caffeine  (FIORICET ) 50-325-40 MG tablet Take 1 tablet by mouth every 6 (six) hours as needed for migraine.      calcium  carbonate (TUMS EX) 750 MG chewable tablet Chew 1 tablet by mouth daily.      cephALEXin  (KEFLEX ) 250 MG capsule Take 1 capsule (250 mg total) by mouth daily. 30 capsule 0   cephALEXin  (KEFLEX ) 250 MG capsule Take 1 capsule (250 mg total) by mouth daily. 30 capsule 10   clonazePAM  (KLONOPIN ) 0.5 MG tablet Take 0.5 mg by mouth 2 (two) times daily as needed for anxiety.     cyanocobalamin  (VITAMIN B12) 1000 MCG tablet Take 1,000 mcg by mouth 2 (two) times a week. Mondays and Fridays     diclofenac  sodium (VOLTAREN ) 1 % GEL Apply 2 g topically 4 (four) times daily. (Patient taking differently: Apply 2 g topically 4 (four) times daily as needed (for pain).) 100 g 1   diphenhydrAMINE  (BENADRYL ) 25 MG tablet Take 25 mg by mouth every 8 (eight) hours as needed for itching or allergies.      DULoxetine  (CYMBALTA ) 60 MG capsule Take 60 mg by mouth daily.     Dupilumab 300 MG/2ML SOAJ Inject 300 mg into the skin every 14 (fourteen) days. 15th and 30th of every month     EPINEPHrine  0.3 mg/0.3 mL IJ SOAJ injection Inject 0.3 mg into the muscle as  needed for anaphylaxis.     ergocalciferol  (VITAMIN D2) 1.25 MG (50000 UT) capsule Take 50,000 Units by mouth 2 (two) times a week. Mondays and Fridays     estradiol  (CLIMARA  - DOSED IN MG/24 HR) 0.05 mg/24hr patch Place 0.05 mg onto the skin 2 (two) times a week. Mondays and Fridays     famotidine  (PEPCID ) 20 MG tablet Take 20 mg by mouth 2 (two) times daily.     fluticasone  furoate-vilanterol (BREO ELLIPTA ) 200-25 MCG/ACT AEPB Inhale 1 puff into the lungs daily.     Galcanezumab-gnlm (EMGALITY) 120 MG/ML SOAJ Inject 120 mg into the skin every 30 (thirty)  days. 30th of each month     hydrochlorothiazide (HYDRODIURIL) 25 MG tablet Take 25 mg by mouth daily.     ketorolac  (TORADOL ) 10 MG tablet Take 1 tablet (10 mg total) by mouth every 6 (six) hours as needed. 20 tablet 0   levocetirizine (XYZAL ) 5 MG tablet Take 5 mg by mouth at bedtime.      levothyroxine  (SYNTHROID ) 125 MCG tablet Take 125-137.5 mcg by mouth every morning. 125mcg on all days except Sundays. Take 137.5 on Sundays only     lisinopril  (ZESTRIL ) 5 MG tablet Take 5 mg by mouth at bedtime.     magnesium  oxide (MAG-OX) 400 MG tablet Take 1,200 mg by mouth 2 (two) times daily. Take 1200 mg by mouth in the morning and 1200 mg at bedtime     meclizine  (ANTIVERT ) 25 MG tablet Take 25 mg by mouth 3 (three) times daily as needed for dizziness.      metFORMIN  (GLUCOPHAGE -XR) 500 MG 24 hr tablet Take 500 mg by mouth 2 (two) times daily.     metoprolol  tartrate (LOPRESSOR ) 25 MG tablet Take 1 tablet (25 mg total) by mouth 2 (two) times daily. 60 tablet 1   montelukast  (SINGULAIR ) 10 MG tablet Take 10 mg by mouth at bedtime.      Multiple Vitamin (TAB-A-VITE) TABS Take 2 tablets by mouth every morning.     naloxone  (NARCAN ) nasal spray 4 mg/0.1 mL Place 1 spray into the nose as needed for up to 365 doses (for opioid-induced respiratory depresssion). In case of emergency (overdose), spray once into each nostril. If no response within 3 minutes, repeat application and call 911. 1 each 1   naphazoline-pheniramine (NAPHCON-A) 0.025-0.3 % ophthalmic solution Place 1 drop into both eyes 4 (four) times daily as needed for eye irritation.     nystatin  (MYCOSTATIN /NYSTOP ) powder Apply 1 Application topically 2 (two) times daily as needed (for irritation).     omeprazole (PRILOSEC) 40 MG capsule Take 40 mg by mouth in the morning and at bedtime.     oxyCODONE  (OXY IR/ROXICODONE ) 5 MG immediate release tablet Take 1 tablet (5 mg total) by mouth every 8 (eight) hours as needed for severe pain (pain score 7-10).  Must last 30 days 90 tablet 0   [START ON 07/25/2024] oxyCODONE  (OXY IR/ROXICODONE ) 5 MG immediate release tablet Take 1 tablet (5 mg total) by mouth every 8 (eight) hours as needed for severe pain (pain score 7-10). Must last 30 days 90 tablet 0   [START ON 08/24/2024] oxyCODONE  (OXY IR/ROXICODONE ) 5 MG immediate release tablet Take 1 tablet (5 mg total) by mouth every 8 (eight) hours as needed for severe pain (pain score 7-10). Must last 30 days 90 tablet 0   OZEMPIC, 0.25 OR 0.5 MG/DOSE, 2 MG/1.5ML SOPN Inject 2 mg into the skin every Tuesday.  pregabalin  (LYRICA ) 225 MG capsule Take 225 mg by mouth 2 (two) times daily.     progesterone  (PROMETRIUM ) 100 MG capsule Take 100 mg by mouth at bedtime.     QUEtiapine  (SEROQUEL ) 50 MG tablet Take 50 mg by mouth at bedtime.     rizatriptan (MAXALT) 10 MG tablet Take 10 mg by mouth as needed for migraine. Take 1 and may repeat in 2 hours for MIgraines, alternate with the disintrgrating tablet.     rosuvastatin  (CRESTOR ) 40 MG tablet Take 40 mg by mouth daily.      tiZANidine  (ZANAFLEX ) 4 MG tablet Take 1 tablet (4 mg total) by mouth 2 (two) times daily as needed for muscle spasms. 30 tablet 0   topiramate  (TOPAMAX ) 100 MG tablet Take 100 mg by mouth 2 (two) times daily.     vitamin k 100 MCG tablet Take 100 mcg by mouth daily.     No current facility-administered medications for this visit.     PHYSICAL EXAMINATION: ECOG PERFORMANCE STATUS: 1 - Symptomatic but completely ambulatory Vitals:   06/26/24 1348  BP: 115/80  Pulse: 76  Resp: 18  Temp: (!) 97.5 F (36.4 C)  SpO2: 99%   Filed Weights   06/26/24 1348  Weight: (!) 338 lb 3.2 oz (153.4 kg)     Physical Exam Constitutional:      General: She is not in acute distress.    Appearance: She is obese.  HENT:     Head: Normocephalic and atraumatic.  Eyes:     General: No scleral icterus.    Pupils: Pupils are equal, round, and reactive to light.  Cardiovascular:     Rate and  Rhythm: Normal rate and regular rhythm.  Pulmonary:     Effort: Pulmonary effort is normal. No respiratory distress.     Breath sounds: No wheezing.  Abdominal:     General: Bowel sounds are normal.     Palpations: Abdomen is soft.  Musculoskeletal:        General: Normal range of motion.     Cervical back: Normal range of motion and neck supple.  Skin:    General: Skin is warm and dry.  Neurological:     Mental Status: She is alert and oriented to person, place, and time. Mental status is at baseline.     Cranial Nerves: No cranial nerve deficit.  Psychiatric:        Mood and Affect: Mood normal.     LABORATORY DATA:  I have reviewed the data as listed    Latest Ref Rng & Units 06/26/2024   10:54 AM 05/21/2024    9:26 AM 05/19/2024    4:25 AM  CBC  WBC 4.0 - 10.5 K/uL 10.4  11.4  8.4   Hemoglobin 12.0 - 15.0 g/dL 86.9  88.0  88.5   Hematocrit 36.0 - 46.0 % 40.8  38.0  36.9   Platelets 150 - 400 K/uL 299  444  343       Latest Ref Rng & Units 05/21/2024    9:26 AM 05/19/2024    4:25 AM 05/18/2024    4:01 AM  CMP  Glucose 70 - 99 mg/dL 837  886  879   BUN 6 - 20 mg/dL 10  9  10    Creatinine 0.44 - 1.00 mg/dL 9.16  9.13  9.11   Sodium 135 - 145 mmol/L 137  139  140   Potassium 3.5 - 5.1 mmol/L 3.9  3.9  4.2  Chloride 98 - 111 mmol/L 107  107  105   CO2 22 - 32 mmol/L 18  23  22    Calcium  8.9 - 10.3 mg/dL 7.6  8.2  8.2    Iron /TIBC/Ferritin/ %Sat    Component Value Date/Time   IRON  56 06/26/2024 1054   TIBC 449 06/26/2024 1054   FERRITIN 32 06/26/2024 1054   IRONPCTSAT 13 06/26/2024 1054     DG PAIN CLINIC C-ARM 1-60 MIN NO REPORT Result Date: 06/26/2024 Fluoro was used, but no Radiologist interpretation will be provided. Please refer to NOTES tab for provider progress note.

## 2024-06-26 NOTE — Assessment & Plan Note (Signed)
 B12 is pending Continue B12 oral supplementation 2 times per week

## 2024-06-26 NOTE — Progress Notes (Signed)
 Nursing Pain Medication Assessment:  Safety precautions to be maintained throughout the outpatient stay will include: orient to surroundings, keep bed in low position, maintain call bell within reach at all times, provide assistance with transfer out of bed and ambulation.  Medication Inspection Compliance: Pill count conducted under aseptic conditions, in front of the patient. Neither the pills nor the bottle was removed from the patient's sight at any time. Once count was completed pills were immediately returned to the patient in their original bottle.  Medication: Oxycodone  IR Pill/Patch Count: 11 of 90 pills/patches remain Pill/Patch Appearance: Markings consistent with prescribed medication Bottle Appearance: Standard pharmacy container. Clearly labeled. Filled Date: 09 / 13 / 2025 Last Medication intake:  Today

## 2024-06-26 NOTE — Patient Instructions (Signed)

## 2024-06-27 ENCOUNTER — Telehealth: Payer: Self-pay

## 2024-06-27 ENCOUNTER — Encounter: Payer: Self-pay | Admitting: Oncology

## 2024-06-27 ENCOUNTER — Telehealth: Payer: Self-pay | Admitting: Pain Medicine

## 2024-06-27 NOTE — Telephone Encounter (Signed)
 Spoke with patient for Post Procedure call, patient states that she is doing great. Will let the office know if she has any concerns.

## 2024-06-27 NOTE — Telephone Encounter (Signed)
 PT wanted to let the nurse know she is returning their call.

## 2024-07-04 NOTE — Progress Notes (Signed)
 PREOP DIAGNOSIS:  left index Trigger Finger.  POSTOP DIAGNOSIS:  left index  Trigger Finger.  PROCEDURE:  left index  Trigger Finger Release.  ANESTHESIA:  Local.  SURGEON:  Ozell Flake, MD.  ASSISTANT:  Luke Goldberg, CMA.  DESCRIPTION OF PROCEDURE:  After time out procedure completed, the patient was given local anesthetic using 2.5 CC of 0.5% Marcaine , 2.5 CC 1% Xylocaine  into each finger at the level of the planned incision.  This was done after first prepping the skin with chlorhexidine .  Ethyl Chloride Spray was applied.  A 25 gauge needle was used.  After being brought to the Operating Room, the patient's arm was prepped and draped in the usual sterile fashion with a tourniquet applied to the upper forearm.  After checking to make sure that adequate anesthesia was obtained, the tourniquet was raised to 250 mmHg.  Skin incision was made and the finger was addressed, spreading the subcutaneous tissue.  Retractors were placed.  The base of the A1 pulley was identified and incised.  Tendon was aprtially frayed from wear, very tight A1 pulley wound was then closed with simple, interrupted #4-0 Nylon.  A sterile, compressive dressing of Adaptic, 4x4's, Webril and Ace wrap was applied and the tourniquet let down.  Tourniquet time was 10 minutes at 250 mmHg.  The patient tolerated the procedure well.     COMPLICATIONS:  None.  SPECIMENS:  None.

## 2024-07-05 ENCOUNTER — Ambulatory Visit: Payer: Self-pay | Admitting: Oncology

## 2024-07-15 NOTE — Progress Notes (Unsigned)
 PROVIDER NOTE: Interpretation of information contained herein should be left to medically-trained personnel. Specific patient instructions are provided elsewhere under Patient Instructions section of medical record. This document was created in part using AI and STT-dictation technology, any transcriptional errors that may result from this process are unintentional.  Patient: Karen Lewis  Service: E/M   PCP: [name], MD  DOB: 1971/08/23  DOS: 07/16/2024  Provider: Eric DELENA Como, MD  MRN: [id number]  Delivery: Face-to-face  Specialty: Interventional Pain Management  Type: Established Patient  Setting: Ambulatory outpatient facility  Specialty designation: 09  Referring Prov.: [name], MD  Location: Outpatient office facility       History of present illness (HPI) [name], a 53 y.o. year old female, is here today because of her Chronic pain of both shoulders [M25.511, G89.29, M25.512]. Karen Lewis primary complain today is Shoulder Pain (Left upper ) and Back Pain  Pertinent problems: Karen Lewis has Fibromyalgia; Chronic knee pain (1ry area of Pain) (Bilateral) (R>L); Chronic low back pain (3ry area of Pain) (Bilateral) (R>L) w/o sciatica; Lumbar facet syndrome (Bilateral) (R>L); Secondary osteoarthritis of multiple sites; Grade 1  Anterolisthesis of L3/L4 & L4/L5 (1.4 cm); Chronic hip pain s/p THR (2ry area of pain) (Bilateral); S/P THR (total hip replacement) (Left); Lumbar spondylosis; Chronic pain syndrome; Neurogenic pain; Upper extremity pain; Chronic shoulder pain (Left); Osteoarthritis of shoulder (Left); Osteoarthritis of hip (Right); Secondary Osteoarthritis of knee (Bilateral) (R>L); Spondylosis without myelopathy or radiculopathy, lumbosacral region; Panniculitis; Leg swelling; Edema of both legs; History of total hip replacement (Bilateral); S/P THR (total hip replacement) (Right) (11/27/2019); Pain and numbness of left upper extremity; Cervical radiculitis (Left);  Aftercare following joint replacement surgery; Osteoarthritis of knee (Bilateral); Osteoarthritis of lumbar spine without myelopathy or radiculopathy; Lumbosacral radiculopathy at S1 (Left); Tricompartment osteoarthritis of knees (Bilateral); Lumbosacral radiculopathy at L2 (Right); Abnormal MRI, lumbar spine (07/01/2022); Chronic thigh pain (Right); Weakness of leg (Right); Lumbar interspinous bursitis (L2-L5); Spinal enthesopathy of lumbar region; Lumbar facet arthropathy (Multilevel) (Bilateral); Lumbar central spinal stenosis, w/o claudication (L2-3, L4-5, L5-S1); Lumbar foraminal stenosis (Bilateral: L2-3, L4-5) (L>R); Lumbosacral lateral recess stenosis (Bilateral: L4-5, L5-S1); Chronic shoulder pain (Right); Presence of artificial hip joint, bilateral; Lumbar facet joint pain; DDD (degenerative disc disease), lumbosacral; Cervical radiculopathy at C6; Cervicalgia; DDD (degenerative disc disease), cervical; Abnormal MRI, cervical spine (01/17/2024); Abnormal MRI, shoulder (02/09/2024) (Left); Osteoarthritis of glenohumeral joint (Left); Osteoarthritis of AC (acromioclavicular) joint (Left); Osteoarthritis of shoulder (Right); Arthralgia of acromioclavicular joint (Right); Osteoarthritis of AC (acromioclavicular) joint (Right); Osteoarthritis of glenohumeral joint (Right); Chronic shoulder pain (Bilateral); and Acromioclavicular joint pain (Bilateral) on their pertinent problem list.  Pain Assessment: Severity of Chronic pain is reported as a 4 /10. Location: Back Lower, Medial, Mid, Right, Left/From shoulder blade area down the spine to lumbar bilateral (R>L). Onset: 1 to 4 weeks ago. Quality: Burning, Aching, Sharp, Tender, Nagging. Timing: Intermittent. Modifying factor(s): Heat, rest, streching, procedures, pain medication. Vitals:  height is 5' 3 (1.6 m) and weight is 333 lb (151 kg) (abnormal). Her temporal temperature is 97.8 F (36.6 C). Her blood pressure is 118/78 and her pulse is 89. Her  respiration is 16 and oxygen saturation is 99%.  BMI: Estimated body mass index is 58.99 kg/m as calculated from the following:   Height as of this encounter: 5' 3 (1.6 m).   Weight as of this encounter: 333 lb (151 kg).  Last encounter: 04/30/2024. Last procedure: 06/26/2024.  Reason for encounter: post-procedure  evaluation and assessment.   Discussed the use of AI scribe software for clinical note transcription with the patient, who gave verbal consent to proceed.  History of Present Illness   Karen Lewis is a 53 year old female who presents for follow-up after bilateral glenohumeral and acromioclavicular joint injections.  She experiences 100% relief of pain in the right shoulder and 80% relief in the left shoulder following bilateral glenohumeral and acromioclavicular joint injections on June 26, 2024. Residual pain persists in the left shoulder.  Persistent pain is present in the shoulder blades, radiating to the mid and lower back, particularly in the lumbar region. She performs stretches but no other exercises for this pain.  Pain radiates down the right buttock and leg, extending to the knee, especially when standing and stretching. The left side is less affected.      Post-Procedure Evaluation   Procedure: Glenohumeral and acromioclavicular joint Injection L3R1  Laterality: Bilateral (-50)  Level: Shoulder  Target: Glenohumeral and acromioclavicular joint Location: Intra-articular  Region: Entire Shoulder Area Approach: Anterolateral approach. Type of procedure: Percutaneous joint injection   Imaging: Fluoroscopy-guided Non-spinal ([id number]) Anesthesia: Local anesthesia (1-2% Lidocaine ) Anxiolysis: IV Versed   2.0 mg Sedation: Minimal Sedation Fentanyl  1 mL (50 mcg) DOS: 06/26/2024  Performed by: Eric DELENA Como, MD  Purpose: Diagnostic/Therapeutic Indications: Shoulder pain severe enough to impact quality of life or function. Rationale (medical necessity):  procedure needed and proper for the diagnosis and/or treatment of Karen Lewis's medical symptoms and needs. 1. Chronic shoulder pain (Bilateral)   2. Acromioclavicular joint pain (Bilateral)   3. Osteoarthritis of shoulder (Left)   4. Osteoarthritis of glenohumeral joint (Left)   5. Osteoarthritis of AC (acromioclavicular) joint (Left)   6. Osteoarthritis of shoulder (Right)   7. Osteoarthritis of glenohumeral joint (Right)   8. Osteoarthritis of AC (acromioclavicular) joint (Right)   9. H/O allergy to shellfish   10. Class 3 severe obesity due to excess calories with serious comorbidity and body mass index (BMI) of 60.0 to 69.9 in adult [facility])    NAS-11 Pain score:   Pre-procedure: 6  (left 6, right r)/10   Post-procedure: 0-No pain/10       Effectiveness:  Initial hour after procedure: 100 %. Subsequent 4-6 hours post-procedure: 100 %. Analgesia past initial 6 hours: 80 % (Right at 100% relief and Left at 80% relief). Ongoing improvement:  Analgesic: The patient indicated having attained 100% relief of pain for the duration of local anesthetic followed by an ongoing 80% improvement on the left shoulder and an ongoing 100% relief of the pain in the right shoulder.  She describes the discomfort in the left shoulder to be limited to the lateral aspect of the upper arm. Function: Karen Lewis reports improvement in function ROM: Karen Lewis reports improvement in ROM  Pharmacotherapy Assessment   Opioid Analgesic: Oxycodone  IR 5 mg 1 tab PO TID (#90/mo) (15 mg/day) MME/day: 22.5 mg/day.   Monitoring: [place] PMP: PDMP reviewed during this encounter.       Pharmacotherapy: No side-effects or adverse reactions reported. Compliance: No problems identified. Effectiveness: Clinically acceptable.  No notes on file  UDS:  Summary  Date Value Ref Range Status  04/19/2024 FINAL  Final    Comment:    ==================================================================== [employer] Select 13  (MW) ==================================================================== Test                             Result  Flag       Units  Drug Absent but Declared for Prescription Verification   Clonazepam                      Not Detected UNEXPECTED ng/mg creat   Oxycodone                       Not Detected UNEXPECTED ng/mg creat   Butalbital                      Not Detected UNEXPECTED ==================================================================== Test                      Result    Flag   Units      Ref Range   Creatinine              95               mg/dL      >=79 ==================================================================== Declared Medications:  The flagging and interpretation on this report are based on the  following declared medications.  Unexpected results may arise from  inaccuracies in the declared medications.   **Note: The testing scope of this panel includes these medications:   Butalbital  (Fioricet )  Clonazepam  (Klonopin )  Oxycodone    **Note: The testing scope of this panel does not include the  following reported medications:   Acetaminophen  (Tylenol )  Acetaminophen  (Fioricet )  Albuterol  (Ventolin  HFA)  Amitriptyline  (Elavil )  Biotin   Caffeine  (Fioricet )  Calcium  (Tums)  Diphenhydramine  (Benadryl )  Duloxetine  (Cymbalta )  Dupilumab  Epinephrine  (EpiPen )  Estradiol  (Estrace )  Famotidine  (Pepcid )  Galcanezumab (Emgality)  Hydrochlorothiazide  Levocetirizine (Xyzal )  Levothyroxine  (Synthroid )  Lisinopril  (Zestril )  Magnesium  (Mag-Ox)  Meclizine  (Antivert )  Metformin   Metoprolol  (Lopressor )  Montelukast  (Singulair )  Naloxone  (Narcan )  Nitrofurantoin  (Macrobid )  Nystatin  (Mycostatin )  Omeprazole (Prilosec)  Phentermine (Adipex)  Pregabalin  (Lyrica )  Probiotic  Progesterone  (Prometrium )  Quetiapine  (Seroquel )  Rizatriptan (Maxalt)  Rosuvastatin  (Crestor )  Semaglutide (Ozempic)  Tizanidine  (Zanaflex )  Topical Diclofenac  (Voltaren )   Topiramate  (Topamax )  Vitamin B12  Vitamin D2 ==================================================================== For clinical consultation, please call [phone]. ====================================================================     No results found for: CBDTHCR No results found for: D8THCCBX No results found for: D9THCCBX  ROS  Constitutional: Denies any fever or chills Gastrointestinal: No reported hemesis, hematochezia, vomiting, or acute GI distress Musculoskeletal: Denies any acute onset joint swelling, redness, loss of ROM, or weakness Neurological: No reported episodes of acute onset apraxia, aphasia, dysarthria, agnosia, amnesia, paralysis, loss of coordination, or loss of consciousness  Medication Review  DULoxetine , Dupilumab, EPINEPHrine , Galcanezumab-gnlm, QUEtiapine , Semaglutide(0.25 or 0.5MG /DOS), Tab-A-Vite, acidophilus, albuterol , amitriptyline , butalbital -acetaminophen -caffeine , calcium  carbonate, cephALEXin , clonazePAM , cyanocobalamin , diclofenac  sodium, diphenhydrAMINE , ergocalciferol , estradiol , famotidine , fluticasone  furoate-vilanterol, hydrochlorothiazide, ketorolac , levocetirizine, levothyroxine , lisinopril , magnesium  oxide, meclizine , metFORMIN , metoprolol  tartrate, montelukast , naloxone , naphazoline-pheniramine, omeprazole, oxyCODONE , pregabalin , progesterone , rizatriptan, rosuvastatin , tiZANidine , topiramate , and vitamin k  History Review  Allergy: Karen Lewis is allergic to bactrim [sulfamethoxazole-trimethoprim], omalizumab, ciprofloxacin, shellfish allergy, and nsaids. Drug: Karen Lewis  reports no history of drug use. Alcohol :  reports no history of alcohol  use. Tobacco:  reports that she has never smoked. She has never used smokeless tobacco. Social: Karen Lewis  reports that she has never smoked. She has never used smokeless tobacco. She reports that she does not drink alcohol  and does not use drugs. Medical:  has a past medical history of Anemia,  Anginal pain, Anxiety, Arthralgia of hip (07/29/2015), Arthritis, Arthritis, degenerative (07/29/2015), Asthma, Cephalalgia (  07/25/2014), Dependence on unknown drug (HCC), Depression, Diabetes mellitus without complication (HCC), Difficult intubation, Dysrhythmia, Eczema, Fibromyalgia, Gastritis, GERD (gastroesophageal reflux disease), Gonalgia (07/29/2015), Gout, H/O cardiovascular disorder (03/10/2015), H/O surgical procedure (12/05/2012), H/O thyroid  disease (03/10/2015), Headache, Herpes, History of artificial joint (07/29/2015), History of hiatal hernia, Hypertension, Hypomagnesemia, Hypothyroidism, IDA (iron  deficiency anemia) (05/28/2019), LBP (low back pain) (07/29/2015), Neuromuscular disorder (HCC), Obesity, PCOS (polycystic ovarian syndrome), Primary osteoarthritis of both knees (07/29/2015), Sleep apnea, Thyroid  nodule (bilateral), and Umbilical hernia. Surgical: Karen Lewis  has a past surgical history that includes Laparoscopic partial gastrectomy; Shoulder arthroscopy (Right); Carpal tunnel release (Bilateral); Diagnostic laparoscopy; Cholecystectomy; Trigger finger release (Right); Thyroidectomy (N/A, 11/12/2015); left trigger finger; Roux-en-Y Gastric Bypass (06/03/2017); Hiatal hernia repair; peniculectomy (N/A, 07/05/2018); Total hip arthroplasty (Right, 11/27/2019); Joint replacement (Bilateral, hip); Appendectomy; Trigger finger release (Right, 04/24/2020); Colonoscopy with propofol  (N/A, 02/11/2021); Esophagogastroduodenoscopy (egd) with propofol  (N/A, 02/11/2021); Xi robotic laparoscopic assisted appendectomy (N/A, 05/12/2024); Laparoscopic lysis of adhesions (N/A, 05/12/2024); and Ventral hernia repair (N/A, 05/16/2024). Family: family history includes Alcohol  abuse in her father and mother; Anxiety disorder in her father and mother; Brain cancer (age of onset: 40) in her paternal uncle; Breast cancer (age of onset: 72) in her paternal aunt and paternal aunt; COPD in her father; Depression in her brother,  father, and mother; Diabetes in her brother, father, and mother; Hypertension in her brother, father, and mother; Kidney cancer in her mother; Kidney failure in her father; Post-traumatic stress disorder in her father; Sleep apnea in her brother, father, and mother.  Laboratory Chemistry Profile   Renal Lab Results  Component Value Date   BUN 10 05/21/2024   CREATININE 0.83 05/21/2024   GFRAA >60 04/16/2020   GFRNONAA >60 05/21/2024    Hepatic Lab Results  Component Value Date   AST 17 05/13/2024   ALT 26 05/13/2024   ALBUMIN 3.0 (L) 05/16/2024   ALKPHOS 64 05/13/2024   LIPASE 40 05/12/2024    Electrolytes Lab Results  Component Value Date   NA 137 05/21/2024   K 3.9 05/21/2024   CL 107 05/21/2024   CALCIUM  7.6 (L) 05/21/2024   MG 2.2 05/21/2024   PHOS 4.1 05/19/2024    Bone Lab Results  Component Value Date   VD25OH 27.4 (L) 11/24/2015   VD125OH2TOT 41.5 11/24/2015    Inflammation (CRP: Acute Phase) (ESR: Chronic Phase) Lab Results  Component Value Date   CRP 0.7 09/27/2019   ESRSEDRATE 30 (H) 09/27/2019   LATICACIDVEN 1.0 05/12/2024         Note: Above Lab results reviewed.  Recent Imaging Review  DG PAIN CLINIC C-ARM 1-60 MIN NO REPORT Fluoro was used, but no Radiologist interpretation will be provided.  Please refer to NOTES tab for provider progress note. Note: Reviewed        Physical Exam  Vitals: BP 118/78 (Cuff Size: Large)   Pulse 89   Temp 97.8 F (36.6 C) (Temporal)   Resp 16   Ht 5' 3 (1.6 m)   Wt (!) 333 lb (151 kg)   LMP  (LMP Unknown)   SpO2 99%   BMI 58.99 kg/m  BMI: Estimated body mass index is 58.99 kg/m as calculated from the following:   Height as of this encounter: 5' 3 (1.6 m).   Weight as of this encounter: 333 lb (151 kg). Ideal: Ideal body weight: 52.4 kg (115 lb 8.3 oz) Adjusted ideal body weight: 91.9 kg (202 lb 8.2 oz) General appearance: Well nourished, well developed, and well  hydrated. In no apparent acute  distress Mental status: Alert, oriented x 3 (person, place, & time)       Respiratory: No evidence of acute respiratory distress Eyes: PERLA   Assessment   Diagnosis Status  1. Chronic shoulder pain (Bilateral)   2. Acromioclavicular joint pain (Bilateral)   3. Chronic shoulder pain (Right)   4. Postop check   5. Chronic low back pain (3ry area of Pain) (Bilateral) (R>L) w/o sciatica   6. Lumbar facet joint pain   7. Lumbar facet arthropathy (Multilevel) (Bilateral)   8. Spondylosis without myelopathy or radiculopathy, lumbosacral region    Controlled Controlled Controlled   Updated Problems: No problems updated.  Plan of Care  Problem-specific:  Assessment and Plan    Left shoulder pain following glenohumeral and acromioclavicular joint injection   Persistent left shoulder pain remains, with 80% relief after bilateral glenohumeral and acromioclavicular joint injections. Right shoulder pain is completely relieved. Follow-up is scheduled for potential repeat joint injections in mid-December, ensuring at least two months between steroid injections.  Lumbar radiculopathy with right-sided symptoms   Chronic lumbar radiculopathy presents with right-sided symptoms, including pain radiating from the lower back to the buttocks and down the right leg to the knee, consistent with facet joint involvement. Previous facet joint injections have been effective, but insurance limits the number to two or three. Facet joint injections are scheduled for mid-December, focusing on the lower lumbar region. Diagnostic blocks will be considered to confirm facet joint involvement if symptoms persist.  Upper back (trapezius region) pain   Chronic upper back pain in the trapezius region may be related to facet joint issues or muscle involvement. The differential includes trigger point pain versus facet joint pain. Imaging limitations are noted, with MRI being the most comprehensive but not currently  performed. Trigger point injections will be considered if muscle involvement is suspected, and evaluation for facet joint involvement will occur if symptoms persist.       Karen Lewis has a current medication list which includes the following long-term medication(s): albuterol , albuterol , diphenhydramine , estradiol , hydrochlorothiazide, magnesium  oxide, metoprolol  tartrate, montelukast , naloxone , oxycodone , [START ON 07/25/2024] oxycodone , [START ON 08/24/2024] oxycodone , progesterone , and rizatriptan.  Pharmacotherapy (Medications Ordered): No orders of the defined types were placed in this encounter.  Orders:  Orders Placed This Encounter  Procedures   LUMBAR FACET(MEDIAL BRANCH NERVE BLOCK) MBNB    Diagnosis: Lumbar Facet Syndrome (M47.816); Lumbosacral Facet Syndrome (M47.817); Lumbar Facet Joint Pain (M54.59) Medical Necessity Statement: 1.Severe chronic axial low back pain causing functional impairment documented by ongoing pain scale assessments. 2.Pain present for longer than 3 months (Chronic) documented to have failed noninvasive conservative therapies. 3.Absence of untreated radiculopathy. 4.There is no radiological evidence of untreated fractures, tumor, infection, or deformity.  Physical Examination Findings: Positive Kemp Maneuver: (Y)  Positive Lumbar Hyperextension-Rotation provocative test: (Y)    Standing Status:   Future    Expiration Date:   10/16/2024    Scheduling Instructions:     Procedure: Lumbar facet Block     Type: Medial Branch Block     Side: Bilateral     Purpose: Palliative     Level(s): L3-4, L4-5, and L5-S1 Facets (L2, L3, L4, L5, and S1 Medial Branch)     Sedation: With Sedation.     Timeframe: Mid December    Where will this procedure be performed?:   [facility]   Nursing Instructions:    Please complete this patient's postprocedure evaluation.  Scheduling Instructions:     Please complete this patient's postprocedure evaluation.      Interventional Therapies  Risk  Complexity Considerations:   Estimated body mass index is 60.59 kg/m as calculated from the following:   Height as of 06/18/21: 5' 4 (1.626 m).   Weight as of 06/18/21: 353 lb (160.1 kg).     NOTE: NO RFA until BMI less or equal to 35.  (Gastric bypass done on 06/03/2017) Iodine allergy  Contrast dye allergy  Shellfish allergy    Planned  Pending:   Therapeutic/palliative bilateral lumbar facet MBB #10    Under consideration:   Therapeutic/palliative bilateral lumbar facet MBB #10    Completed:   Diagnostic/therapeutic left IA shoulder (glenohumeral + AC) joint inj. x2 (first of 2025)  Therapeutic right L2-3 LESI x1 (04/29/2022) (100/100/60/60)  Therapeutic bilateral IA Monovisc knee inj. x1 (05/25/2022)  Therapeutic right trapezius TPI/MNB x1 (05/25/2022)  Therapeutic bilateral IA Zilretta  knee inj. x4 (03/25/2022) (100/100/80/80)  Therapeutic bilateral IA Hyalgan knee inj. x16 (06/24/2020) (100/100/60/60)  Palliative/therapeutic bilateral lumbar facet MBB x9 (07/07/2023) (100/100/70/70)  Therapeutic right IA hip injection x3 (07/07/2017) (100/50/25/>50)  Therapeutic bilateral IA shoulder (glenohumeral + AC) joint inj. L3R1 (06/26/2024) (100/100/100/R:100L:80)  (07/28/2023) referral to interventional pain management practice for bilateral lumbar facet RFA    Completed by other providers:   Therapeutic bilateral THR (total hip replacements) (Dr. Kathlynn) (Right: 11/27/2019) (Left: 07/29/2015)    Therapeutic  Palliative (PRN) options:   Palliative bilateral knee injections  Palliative bilateral lumbar facet MBBs    Pharmacotherapy  Nonopioid transferred 06/24/2020: Lyrica       Return in about 6 weeks (around 08/28/2024) for (ECT):(B) L-FCT Blk .    Recent Visits Date Type Provider Dept  06/26/24 Procedure visit Tanya Glisson, MD [facility]  06/12/24 Office Visit Patel, Seema K, NP [facility]  04/30/24 Office  Visit Tanya Glisson, MD [facility]  04/19/24 Office Visit Patel, Seema K, NP [facility]  Showing recent visits within past 90 days and meeting all other requirements Today's Visits Date Type Provider Dept  07/16/24 Office Visit Tanya Glisson, MD [facility]  Showing today's visits and meeting all other requirements Future Appointments Date Type Provider Dept  08/30/24 Appointment Patel, Seema K, NP [facility]  09/04/24 Appointment Tanya Glisson, MD [facility]  Showing future appointments within next 90 days and meeting all other requirements  I discussed the assessment and treatment plan with the patient. The patient was provided an opportunity to ask questions and all were answered. The patient agreed with the plan and demonstrated an understanding of the instructions.  Patient advised to call back or seek an in-person evaluation if the symptoms or condition worsens.  Duration of encounter: 30 minutes.  Total time on encounter, as per AMA guidelines included both the face-to-face and non-face-to-face time personally spent by the physician and/or other qualified health care professional(s) on the day of the encounter (includes time in activities that require the physician or other qualified health care professional and does not include time in activities normally performed by clinical staff). Physician's time may include the following activities when performed: Preparing to see the patient (e.g., pre-charting review of records, searching for previously ordered imaging, lab work, and nerve conduction tests) Review of prior analgesic pharmacotherapies. Reviewing PMP Interpreting ordered tests (e.g., lab work, imaging, nerve conduction tests) Performing post-procedure evaluations, including interpretation of diagnostic procedures Obtaining and/or reviewing separately obtained history Performing a medically appropriate  examination and/or evaluation Counseling and educating the patient/family/caregiver Ordering medications, tests, or procedures Referring and communicating with other health care professionals (when not separately reported) Documenting clinical information in the electronic or other health record Independently interpreting results (not separately reported) and communicating results to the patient/ family/caregiver Care coordination (not separately reported)  Note by: Eric DELENA Como, MD (TTS and AI technology used. I apologize for any typographical errors that were not detected and corrected.) Date: 07/16/2024; Time: 2:02 PM

## 2024-07-16 ENCOUNTER — Ambulatory Visit: Attending: Pain Medicine | Admitting: Pain Medicine

## 2024-07-16 ENCOUNTER — Encounter: Payer: Self-pay | Admitting: Pain Medicine

## 2024-07-16 VITALS — BP 118/78 | HR 89 | Temp 97.8°F | Resp 16 | Ht 63.0 in | Wt 333.0 lb

## 2024-07-16 DIAGNOSIS — M25511 Pain in right shoulder: Secondary | ICD-10-CM | POA: Diagnosis not present

## 2024-07-16 DIAGNOSIS — M47816 Spondylosis without myelopathy or radiculopathy, lumbar region: Secondary | ICD-10-CM | POA: Diagnosis present

## 2024-07-16 DIAGNOSIS — Z09 Encounter for follow-up examination after completed treatment for conditions other than malignant neoplasm: Secondary | ICD-10-CM | POA: Diagnosis not present

## 2024-07-16 DIAGNOSIS — G8929 Other chronic pain: Secondary | ICD-10-CM | POA: Insufficient documentation

## 2024-07-16 DIAGNOSIS — M47817 Spondylosis without myelopathy or radiculopathy, lumbosacral region: Secondary | ICD-10-CM | POA: Insufficient documentation

## 2024-07-16 DIAGNOSIS — M25512 Pain in left shoulder: Secondary | ICD-10-CM | POA: Diagnosis present

## 2024-07-16 DIAGNOSIS — M5459 Other low back pain: Secondary | ICD-10-CM | POA: Insufficient documentation

## 2024-07-16 DIAGNOSIS — M545 Low back pain, unspecified: Secondary | ICD-10-CM | POA: Insufficient documentation

## 2024-07-16 NOTE — Patient Instructions (Signed)
 ______________________________________________________________________    Steroid injections  Common steroids for injections Triamcinolone : Used by many sports medicine physicians for large joint and bursal injections, often combined with a local anesthetic like lidocaine . A study focusing on coccydynia (tailbone pain) found triamcinolone  was more effective than betamethasone , suggesting it may also be preferable for other localized inflammation conditions. Methylprednisolone : A common alternative to triamcinolone  that is also a strong anti-inflammatory. It is available in different formulations, with the acetate suspension being the long-acting option for intra-articular injections. Dexamethasone : This is a non-particulate steroid, meaning it has a lower risk of tissue damage compared to particulate steroids like triamcinolone  and methylprednisolone . While less common for this specific use, it is an option for targeted injections.   Considerations for physicians Particulate vs. non-particulate steroids: Triamcinolone  and methylprednisolone  are particulate, meaning they can clump together. Dexamethasone  is non-particulate. Particulate steroids are often preferred for their longer-lasting effects but carry a theoretical higher risk for certain injections (though this is less of a concern in the costochondral joints). Combined injectate: Corticosteroids are typically mixed with a local anesthetic like lidocaine  to provide both immediate pain relief (from the anesthetic) and longer-term inflammation reduction (from the steroid). Imaging guidance: To ensure accurate placement of the needle and medication, physicians may use ultrasound or fluoroscopic guidance for the injection, especially in complex or refractory cases.   Patient guidance Before undergoing a steroid injection, discuss the options with your physician. They will determine the best steroid, dosage, and procedure for your specific case  based on factors like: Severity of your condition History of response to other treatments Your overall health status Experience and preference of the physician  Last  Updated: 05/08/2024 ______________________________________________________________________     ______________________________________________________________________    Procedure instructions  Stop blood-thinners  Do not eat or drink fluids (other than water ) for 6 hours before your procedure  No water  for 2 hours before your procedure  Take your blood pressure medicine with a sip of water   Arrive 30 minutes before your appointment  If sedation is planned, bring suitable driver. [place], [place], & public transportation are NOT APPROVED)  Carefully read the Preparing for your procedure detailed instructions  If you have questions call us  at [phone]  Procedure appointments are for procedures only.   NO medication refills or new problem evaluations will be done on procedure days.   Only the scheduled, pre-approved procedure and side will be done.   ______________________________________________________________________     ______________________________________________________________________    Preparing for your procedure  Appointments: If you think you may not be able to keep your appointment, call 24-48 hours in advance to cancel. We need time to make it available to others.  Procedure visits are for procedures only. During your procedure appointment there will be: NO Prescription Refills*. NO medication changes or discussions*. NO discussion of disability issues*. NO unrelated pain problem evaluations*. NO evaluations to order other pain procedures*. *These will be addressed at a separate and distinct evaluation encounter on the provider's evaluation schedule and not during procedure days.  Instructions: Food intake: Avoid eating anything solid for at least 8 hours prior to your  procedure. Clear liquid intake: You may take clear liquids such as water  up to 2 hours prior to your procedure. (No carbonated drinks. No soda.) Transportation: Unless otherwise stated by your physician, bring a driver. (Driver cannot be a Market Researcher, Pharmacist, Community, or any other form of public transportation.) Morning Medicines: Except for blood thinners, take all of your other morning medications with a sip  of water . Make sure to take your heart and blood pressure medicines. If your blood pressure's lower number is above 100, the case will be rescheduled. Blood thinners: Make sure to stop your blood thinners as instructed.  If you take a blood thinner, but were not instructed to stop it, call our office [phone] and ask to talk to a nurse. Not stopping a blood thinner prior to certain procedures could lead to serious complications. Diabetics on insulin : Notify the staff so that you can be scheduled 1st case in the morning. If your diabetes requires high dose insulin , take only  of your normal insulin  dose the morning of the procedure and notify the staff that you have done so. Preventing infections: Shower with an antibacterial soap the morning of your procedure.  Build-up your immune system: Take 1000 mg of Vitamin C with every meal (3 times a day) the day prior to your procedure. Antibiotics: Inform the nursing staff if you are taking any antibiotics or if you have any conditions that may require antibiotics prior to procedures. (Example: recent joint implants)   Pregnancy: If you are pregnant make sure to notify the nursing staff. Not doing so may result in injury to the fetus, including death.  Sickness: If you have a cold, fever, or any active infections, call and cancel or reschedule your procedure. Receiving steroids while having an infection may result in complications. Arrival: You must be in the facility at least 30 minutes prior to your scheduled procedure. Tardiness: Your scheduled time is also the  cutoff time. If you do not arrive at least 15 minutes prior to your procedure, you will be rescheduled.  Children: Do not bring any children with you. Make arrangements to keep them home. Dress appropriately: There is always a possibility that your clothing may get soiled. Avoid long dresses. Valuables: Do not bring any jewelry or valuables.  Reasons to call and reschedule or cancel your procedure: (Following these recommendations will minimize the risk of a serious complication.) Surgeries: Avoid having procedures within 2 weeks of any surgery. (Avoid for 2 weeks before or after any surgery). Flu Shots: Avoid having procedures within 2 weeks of a flu shots or . (Avoid for 2 weeks before or after immunizations). Barium: Avoid having a procedure within 7-10 days after having had a radiological study involving the use of radiological contrast. (Myelograms, Barium swallow or enema study). Heart attacks: Avoid any elective procedures or surgeries for the initial 6 months after a Myocardial Infarction (Heart Attack). Blood thinners: It is imperative that you stop these medications before procedures. Let us  know if you if you take any blood thinner.  Infection: Avoid procedures during or within two weeks of an infection (including chest colds or gastrointestinal problems). Symptoms associated with infections include: Localized redness, fever, chills, night sweats or profuse sweating, burning sensation when voiding, cough, congestion, stuffiness, runny nose, sore throat, diarrhea, nausea, vomiting, cold or Flu symptoms, recent or current infections. It is specially important if the infection is over the area that we intend to treat. Heart and lung problems: Symptoms that may suggest an active cardiopulmonary problem include: cough, chest pain, breathing difficulties or shortness of breath, dizziness, ankle swelling, uncontrolled high or unusually low blood pressure, and/or palpitations. If you are  experiencing any of these symptoms, cancel your procedure and contact your primary care physician for an evaluation.  Remember:  Regular Business hours are:  Monday to Thursday 8:00 AM to 4:00 PM  Provider's Schedule: Eric Como,  MD:  Procedure days: Tuesday and Thursday 7:30 AM to 4:00 PM  Wallie Sherry, MD:  Procedure days: Monday and Wednesday 7:30 AM to 4:00 PM Last  Updated: 08/23/2023 ______________________________________________________________________     ______________________________________________________________________    General Risks and Possible Complications  Patient Responsibilities: It is important that you read this as it is part of your informed consent. It is our duty to inform you of the risks and possible complications associated with treatments offered to you. It is your responsibility as a patient to read this and to ask questions about anything that is not clear or that you believe was not covered in this document.  Patient's Rights: You have the right to refuse treatment. You also have the right to change your mind, even after initially having agreed to have the treatment done. However, under this last option, if you wait until the last second to change your mind, you may be charged for the materials used up to that point.  Introduction: Medicine is not an visual merchandiser. Everything in Medicine, including the lack of treatment(s), carries the potential for danger, harm, or loss (which is by definition: Risk). In Medicine, a complication is a secondary problem, condition, or disease that can aggravate an already existing one. All treatments carry the risk of possible complications. The fact that a side effects or complications occurs, does not imply that the treatment was conducted incorrectly. It must be clearly understood that these can happen even when everything is done following the highest safety standards.  No treatment: You can choose not to proceed  with the proposed treatment alternative. The "PRO(s)" would include: avoiding the risk of complications associated with the therapy. The "CON(s)" would include: not getting any of the treatment benefits. These benefits fall under one of three categories: diagnostic; therapeutic; and/or palliative. Diagnostic benefits include: getting information which can ultimately lead to improvement of the disease or symptom(s). Therapeutic benefits are those associated with the successful treatment of the disease. Finally, palliative benefits are those related to the decrease of the primary symptoms, without necessarily curing the condition (example: decreasing the pain from a flare-up of a chronic condition, such as incurable terminal cancer).  General Risks and Complications: These are associated to most interventional treatments. They can occur alone, or in combination. They fall under one of the following six (6) categories: no benefit or worsening of symptoms; bleeding; infection; nerve damage; allergic reactions; and/or death. No benefits or worsening of symptoms: In Medicine there are no guarantees, only probabilities. No healthcare provider can ever guarantee that a medical treatment will work, they can only state the probability that it may. Furthermore, there is always the possibility that the condition may worsen, either directly, or indirectly, as a consequence of the treatment. Bleeding: This is more common if the patient is taking a blood thinner, either prescription or over the counter (example: Goody Powders, Fish oil, Aspirin, Garlic, etc.), or if suffering a condition associated with impaired coagulation (example: Hemophilia, cirrhosis of the liver, low platelet counts, etc.). However, even if you do not have one on these, it can still happen. If you have any of these conditions, or take one of these drugs, make sure to notify your treating physician. Infection: This is more common in patients with a  compromised immune system, either due to disease (example: diabetes, cancer, human immunodeficiency virus [HIV], etc.), or due to medications or treatments (example: therapies used to treat cancer and rheumatological diseases). However, even if you do not have  one on these, it can still happen. If you have any of these conditions, or take one of these drugs, make sure to notify your treating physician. Nerve Damage: This is more common when the treatment is an invasive one, but it can also happen with the use of medications, such as those used in the treatment of cancer. The damage can occur to small secondary nerves, or to large primary ones, such as those in the spinal cord and brain. This damage may be temporary or permanent and it may lead to impairments that can range from temporary numbness to permanent paralysis and/or brain death. Allergic Reactions: Any time a substance or material comes in contact with our body, there is the possibility of an allergic reaction. These can range from a mild skin rash (contact dermatitis) to a severe systemic reaction (anaphylactic reaction), which can result in death. Death: In general, any medical intervention can result in death, most of the time due to an unforeseen complication. ______________________________________________________________________

## 2024-07-31 ENCOUNTER — Encounter: Admitting: Nurse Practitioner

## 2024-08-06 NOTE — Progress Notes (Signed)
 Subjective:     Patient ID: Karen Lewis is a 53 y.o. female.  HPI: PRESENTS WITH NASAL CONGESTION , COUGH AND SINUS PRESSURE. HX OF ASTHMA. NO FEVER. SYMPTOMS STARTED OVER 2 WKS AGO. COUGH OCC PRODUCTIVE. SOME SORE THROAT AND EAR PRESSURES. RECORDS REVIEWED.   Past Medical History:  has a past medical history of Abdominal hernia (Hiatel hernia 2013 repaired 2015), Acid reflux, AKI (acute kidney injury) (08/08/2022), Allergic rhinitis due to allergen (09/13/1982), Allergic state, Anxiety, Arrhythmia (Sept 1998), Arthritis (In my early 20's in my spine, knees, and ankles), Asthma without status asthmaticus (HHS-HCC), Chickenpox, Cholecystitis (05-28-2004), Depression, Dermatitis (09/14/1979), Diabetes mellitus type 2, uncomplicated (CMS/HHS-HCC), Eczema, Edema (09-13-1996), Elevated LFTs (06/07/2019), Fibromyalgia, Fibromyalgia, Food intolerance (09/13/2006), Gastroesophageal reflux disease without esophagitis (05/04/2016), GERD (gastroesophageal reflux disease) (Late 20's), Goiter (December 20, 2011), Gout, [place] filter in place, Herpes simplex virus (HSV) infection, History of blood transfusion (Oct. 2022), History of pneumonia (Jan 2022), Hyperlipidemia (06-09-92), Hypertension (06-09-92), IDA (iron  deficiency anemia) (05/28/2019), Migraines, MRSA (methicillin resistant Staphylococcus aureus), Neuro-degenerative disorders (), Obese, Personal history of other diseases of the nervous system and sense organs (03/10/2015), Personal history of other diseases of the respiratory system (03/10/2015), Personal history of other specified conditions (03/10/2015), Polycystic ovarian syndrome, Radiculopathy affecting upper extremity, Rotator cuff tear, Sinusitis, unspecified (09/13/2014), Sleep apnea (Early 30's), Syncopal episodes, Thrombophlebitis of iliac vein, unspecified laterality (CMS/HHS-HCC) (05/04/2016), Thyroid  disease (02-12-2012, 01-12-2016), Venereal disease, and Vertigo. Past Surgical History:  has a past  surgical history that includes Laparoscopic Sleeve Gastrectomy (N/A, 12/13/2011); Replacement total hip w/  resurfacing implants (Left, 03/22/2013); Shoulder arthroscopy w/ acromial repair (Right); esophagogastrodoudenoscopy w/biopsy (11/19/2013); Cholecystectomy (September 2005); Thyroidectomy Total (11/12/2015); Right Trigger Thumb Release ; Trigger Finger Release (Left, 11/17/2016); Joint replacement (L-03/2019, R-09/2019); Knee arthroscopy (05/2000); Incision Tendon Sheath For Trigger Finger (Right, 02/25/2020); Incision Tendon Sheath For Trigger Finger (Right, 04/24/2020); Bronchoscopy (09/14/2007); Arthroplasty Hip Total (Right, 11/27/2019); Colonoscopy (02/11/2021); egd (02/11/2021); egd (N/A, 03/11/2023); Colonoscopy (2009); Upper gastrointestinal endoscopy (2015); Gastrectomy (Gastric bypass 2013); ERCP (2005); Carpal tunnel release (2011); Gastric restriction surgery (December 13, 2011); Sigmoidoscopy (2015); Bariatric Surgery (12/13/2011); Gynecologic cryosurgery (90's ???); Hernia repair (05/16/2024); Appendectomy (05/12/2024); and Trigger finger release (Left, 07/04/2024). Family History: family history includes Alcohol  abuse in her father and mother; Allergic rhinitis in her maternal grandfather and mother; Allergies in her brother, father, and mother; Alzheimer's disease in her maternal grandfather; Anxiety in her mother; Aortic aneurysm in her maternal grandfather; Arthritis in her brother, brother, father, maternal grandmother, and mother; Asthma in her brother, maternal grandfather, and mother; COPD in her father, maternal grandfather, and mother; Cancer in her mother, paternal aunt, paternal aunt, and paternal uncle; Clotting disorder in her mother; Coronary Artery Disease (Blocked arteries around heart) in her father and mother; Deep vein thrombosis (DVT or abnormal blood clot formation) in her maternal grandmother and mother; Dementia in her maternal grandfather; Depression in her father, maternal  grandmother, and mother; Diabetes in her father, maternal grandfather, maternal grandmother, mother, paternal aunt, paternal grandfather, and paternal grandmother; Diabetes type II in her brother, father, maternal grandfather, maternal grandmother, mother, paternal aunt, paternal grandfather, and paternal grandmother; Emphysema in her maternal grandfather; Heart disease in her father and mother; High blood pressure (Hypertension) in her brother, father, mother, paternal grandfather, and paternal grandmother; Hyperlipidemia (Elevated cholesterol) in her father, mother, and paternal aunt; Kidney cancer in her mother; Kidney failure in her father and paternal grandmother; Lung cancer in her mother; Mental illness in her father and mother; Migraines in  her mother; Obesity in her brother, father, maternal grandfather, maternal grandmother, mother, paternal aunt, paternal aunt, paternal aunt, paternal grandfather, paternal grandmother, and paternal uncle; Osteoarthritis in her father, maternal grandmother, mother, and paternal grandmother; Osteoporosis (Thinning of bones) in her maternal grandmother and mother; Peripheral vascular disease in her father; Renal cell carcinoma in her mother; Sleep apnea in her father and mother; Substance Abuse in her father, maternal uncle, maternal uncle, and mother. Social History:  reports that she has never smoked. She has never used smokeless tobacco. She reports that she does not drink alcohol  and does not use drugs. Prior to encounter Medications:  Current Outpatient Medications on File Prior to Visit  Medication Sig Dispense Refill  . albuterol  (PROVENTIL ) 2.5 mg /3 mL (0.083 %) nebulizer solution Take 3 mLs (2.5 mg total) by nebulization every 6 (six) hours as needed 75 mL 1  . albuterol  MDI, PROVENTIL , VENTOLIN , PROAIR , HFA (VENTOLIN  HFA) 90 mcg/actuation inhaler Inhale 2 inhalations into the lungs every 6 (six) hours as needed 18 g 3  . amitriptyline  (ELAVIL ) 100 MG tablet  Take 1 tablet (100 mg total) by mouth at bedtime 90 tablet 3  . benzonatate (TESSALON) 200 MG capsule Take 1 capsule (200 mg total) by mouth 3 (three) times daily as needed for Cough 100 capsule 3  . blood glucose diagnostic (ACCU-CHEK GUIDE TEST STRIPS) test strip once daily Use as instructed. 100 strip 4  . blood glucose diagnostic test strip Use 2 (two) times daily 200 each 3  . butalbital -acetaminophen -caffeine  (FIORICET ) 50-325-40 mg tablet Take 1 tablet by mouth every 6 (six) hours as needed for Pain 30 tablet 5  . calcium  carbonate (TUMS E-X) 300 mg (750 mg) chewable tablet Take by mouth    . clonazePAM  (KLONOPIN ) 0.5 MG tablet Take 0.5 mg by mouth once daily    . cyanocobalamin  (VITAMIN B12) 1000 MCG tablet Take 1,000 mcg by mouth twice a week Mon & Fri only    . diphenhydrAMINE  (BENADRYL ) 25 mg capsule Take 1 capsule (25 mg total) by mouth as needed for Itching    . DULoxetine  (CYMBALTA ) 60 MG DR capsule Take 60 mg by mouth once daily    . dupilumab (DUPIXENT) 300 mg/2 mL pen injector Inject 2 mLs (300 mg total) subcutaneously every 14 (fourteen) days 4 mL 11  . EMGALITY PEN 120 mg/mL PnIj INJECT 120MG  (1 PEN) UNDER THE SKIN ONCE A MONTH 3 mL 3  . EPINEPHrine  (EPIPEN ) 0.3 mg/0.3 mL auto-injector Inject 0.3 mg into the muscle once as needed for Anaphylaxis    . ergocalciferol , vitamin D2, 1,250 mcg (50,000 unit) capsule TAKE ONE CAPSULE BY MOUTH TWICE A WEEK 24 capsule 0  . estradioL  (ESTRACE ) 0.01 % (0.1 mg/gram) vaginal cream Place 1 g vaginally twice a week for 180 days Insert 1/4 applicator twice weekly 42.5 g 1  . estradioL  (ESTRACE ) 0.5 MG tablet Take 1 tablet (0.5 mg total) by mouth once daily for 180 days 30 tablet 5  . famotidine  (PEPCID ) 20 MG tablet Take 20 mg by mouth 2 (two) times daily    . fluticasone  furoate-vilanteroL (BREO ELLIPTA ) 200-25 mcg/dose DsDv Inhale 1 Puff into the lungs once daily 1 each 4  . Lacto.acidophilus-Bif.animalis 32 billion cell Cap Take 1 capsule by  mouth once daily    . lancing device Misc Use 1 each as directed. Accu Chek Softclix. DX: E11.8 1 each 0  . levocetirizine (XYZAL ) 5 MG tablet TAKE 1 TABLET EVERY EVENING 90 tablet 3  .  levothyroxine  (SYNTHROID ) 125 MCG tablet Take 1 tablet (125 mcg total) by mouth once daily Monday-Saturday.  Take 1.5 pills each week on Sunday.  Take on an empty stomach with a glass of water  at least 30-60 minutes before breakfast. 45 tablet 0  . lisinopriL  (ZESTRIL ) 5 MG tablet Take 1 tablet (5 mg total) by mouth once daily 90 tablet 3  . magnesium  oxide (MAG-OX) 400 mg (241.3 mg magnesium ) tablet TAKE 3 TABLETS BY MOUTH IN THE MORNING AND 3 TABLETS AT BEDTIME 540 tablet 3  . meclizine  (ANTIVERT ) 25 mg tablet Take 1 tablet (25 mg total) by mouth 3 (three) times daily as needed for Dizziness 90 tablet 2  . metFORMIN  (GLUCOPHAGE -XR) 500 MG XR tablet Take 2 tablets (1,000 mg total) by mouth 2 (two) times daily (Patient taking differently: Take 500 mg by mouth 2 (two) times daily) 360 tablet 3  . metoprolol  TARTrate (LOPRESSOR ) 50 MG tablet Take 1 tablet (50 mg total) by mouth 2 (two) times daily And and additional tab prn 180 tablet 3  . montelukast  (SINGULAIR ) 10 mg tablet Take 1 tablet (10 mg total) by mouth once daily 90 tablet 3  . multivitamin (TAB-A-VITE) 400 mcg tablet TAKE (2) TABLETS BY MOUTH ONCE DAILY. 180 tablet 1  . naphazoline-pheniramine (NAPHCON-A) 0.025-0.3 % ophthalmic solution Place 1 drop into both eyes once daily as needed    . naproxen sodium (ALEVE) 220 MG tablet Take 440 mg by mouth as needed for Pain    . omeprazole (PRILOSEC) 40 MG DR capsule Take 1 capsule (40 mg total) by mouth 2 (two) times daily 180 capsule 3  . oxyCODONE  (ROXICODONE ) 5 MG immediate release tablet Take 5 mg by mouth every 4 (four) hours as needed for Pain Up to 3 times daily as needed    . phytonadione, vit K1, (VITAMIN K) 100 mcg tablet Take 100 mcg by mouth once daily    . pregabalin  (LYRICA ) 225 MG capsule Take 1  capsule (225 mg total) by mouth 2 (two) times daily 180 capsule 1  . progesterone  (PROMETRIUM ) 100 MG capsule Take 1 capsule (100 mg total) by mouth at bedtime for 180 days 100 capsule 1  . QUEtiapine  (SEROQUEL ) 50 MG tablet Take 2 tablets (100 mg total) by mouth at bedtime 60 tablet 5  . rizatriptan (MAXALT-MLT) 10 MG disintegrating tablet     . rosuvastatin  (CRESTOR ) 40 MG tablet Take 1 tablet (40 mg total) by mouth once daily 90 tablet 3  . semaglutide (OZEMPIC) 2 mg/dose (8 mg/3 mL) pen injector Inject 0.75 mLs (2 mg total) subcutaneously every 7 (seven) days    . sodium fluoride-pot nitrate 1.1-5 % once daily    . tiZANidine  (ZANAFLEX ) 4 MG tablet Take 1 tablet (4 mg total) by mouth 2 (two) times daily as needed 180 tablet 3  . topiramate  (TOPAMAX ) 100 MG tablet TAKE 1 TABLET BY MOUTH TWICE  DAILY 200 tablet 2  . triamcinolone  (NASACORT  AQ) 55 mcg nasal spray Place 2 sprays into both nostrils once daily 1 mL 4  . blood glucose diagnostic test strip 1 each (1 strip total) 2 (two) times daily E11.9 ACCU CHEK GUIDE TEST STRIPS. 200 each 3   Current Facility-Administered Medications on File Prior to Visit  Medication Dose Route Frequency Provider Last Rate Last Admin  . onabotulinumtoxinA (BOTOX) solution 155 Units  155 Units Intramuscular As Directed Dewey Pellet [place], [place]   155 Units at 06/04/24 1550   Allergies: is allergic to ciprofloxacin, shellfish  containing products, sulfamethoxazole, trimethoprim, aspirin, bactrim [sulfamethoxazole-trimethoprim], motrin [ibuprofen], nsaids (non-steroidal anti-inflammatory drug), sulfa (sulfonamide antibiotics), and xolair [omalizumab].  This an established patient is here today for: office visit.  Review of Systems     Objective:   Physical Exam Vitals and nursing note reviewed.  Constitutional:      General: She is not in acute distress.    Appearance: Normal appearance. She is not ill-appearing or toxic-appearing.  HENT:     Head:  Normocephalic and atraumatic.     Right Ear: Tympanic membrane and ear canal normal.     Left Ear: Tympanic membrane and ear canal normal.     Nose: Congestion and rhinorrhea present.     Mouth/Throat:     Mouth: Mucous membranes are moist.     Pharynx: Posterior oropharyngeal erythema present.  Cardiovascular:     Rate and Rhythm: Normal rate and regular rhythm.  Pulmonary:     Effort: Pulmonary effort is normal.     Breath sounds: Normal breath sounds. No wheezing.  Musculoskeletal:     Cervical back: Normal range of motion and neck supple.  Lymphadenopathy:     Cervical: No cervical adenopathy.  Skin:    General: Skin is warm and dry.     Findings: No rash.  Neurological:     Mental Status: She is alert.        Assessment:     1. Sinusitis, unspecified chronicity, unspecified location   -     Extended Respiratory Viral Panel [facility]; Future -     Xpert Dx Strep A, PCR - [facility] -     doxycycline  (VIBRA -TABS) 100 MG tablet; Take 1 tablet (100 mg total) by mouth 2 (two) times daily for 10 days  2. Acute upper respiratory infection   -     doxycycline  (VIBRA -TABS) 100 MG tablet; Take 1 tablet (100 mg total) by mouth 2 (two) times daily for 10 days -     predniSONE  (DELTASONE ) 10 MG tablet; Take 1 tablet (10 mg total) by mouth once daily for 5 days -     promethazine-dextromethorphan (PROMETHAZINE-DM) 6.25-15 mg/5 mL syrup; Take 5 mLs by mouth every 6 (six) hours as needed for up to 7 days  3. Asthma due to seasonal allergies (HHS-HCC)   -     predniSONE  (DELTASONE ) 10 MG tablet; Take 1 tablet (10 mg total) by mouth once daily for 5 days  Other orders -     fluconazole  (DIFLUCAN ) 150 MG tablet; Take 1 tablet (150 mg total) by mouth once for 1 dose REPEAT IN 2 DAYS IF NEEDED     Plan:     Patient Instructions  TESTING IS ALL NEGATIVE ANTIBIOTIC  PREDNISONE  COUGH PREP  PLAIN GENERIC MUCINEX   MONITOR YOUR SYMPTOMS.  FOLLOW UP WITH YOUR PCP  MONITOR YOUR BLOOD  SUGARS.
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
## 2024-08-20 ENCOUNTER — Encounter: Payer: Self-pay | Admitting: Psychiatry

## 2024-08-20 ENCOUNTER — Ambulatory Visit: Payer: Self-pay | Admitting: Psychiatry

## 2024-08-20 DIAGNOSIS — F330 Major depressive disorder, recurrent, mild: Secondary | ICD-10-CM | POA: Diagnosis not present

## 2024-08-20 DIAGNOSIS — F4310 Post-traumatic stress disorder, unspecified: Secondary | ICD-10-CM

## 2024-08-20 DIAGNOSIS — G4700 Insomnia, unspecified: Secondary | ICD-10-CM | POA: Diagnosis not present

## 2024-08-20 NOTE — Patient Instructions (Addendum)
 Consider tapering off duloxetine  if you mood continue to improve to avoid polypharmacy, and risk of serotonin syndrome Consider tapering off clonazepam  to avoid long-term adverse reaction Consider neuropsychological testing for autism, ADHD if a formal evaluation is warranted Consider re evaluation of sleep apnea Referral for therapy onsite Next appointment- N/A. Please contact our office if interested in transferring the care

## 2024-08-30 ENCOUNTER — Telehealth: Payer: Self-pay

## 2024-08-30 ENCOUNTER — Encounter: Admitting: Nurse Practitioner

## 2024-08-30 NOTE — Telephone Encounter (Signed)
 Pt confirmed and verbalized understanding will reach out to the nurse or current office she goes to as she may transfer care here but will first speak with nurse.

## 2024-08-30 NOTE — Telephone Encounter (Signed)
 I believe she is under the care of another psychiatrist, as I saw her for a second opinion. Please advise her to contact her provider and/or her primary care physician to proceed with that referral.

## 2024-09-04 ENCOUNTER — Ambulatory Visit: Admitting: Pain Medicine

## 2024-09-04 DIAGNOSIS — Z5189 Encounter for other specified aftercare: Secondary | ICD-10-CM

## 2024-09-04 DIAGNOSIS — M545 Low back pain, unspecified: Secondary | ICD-10-CM | POA: Insufficient documentation

## 2024-09-04 DIAGNOSIS — M5459 Other low back pain: Secondary | ICD-10-CM | POA: Insufficient documentation

## 2024-09-04 DIAGNOSIS — Z91199 Patient's noncompliance with other medical treatment and regimen due to unspecified reason: Secondary | ICD-10-CM

## 2024-09-04 NOTE — Progress Notes (Signed)
 Department: [facility]) Date: 09/04/2024  Event: Canceled/Rescheduled by patient.  Encounter Type: Procedure.          Advance notice: Less than 24 hr notice.  Reason: Sickness. Asthma flare-up. Note: n/a (not applicable).          Disposition: Patient will R/S

## 2024-09-04 NOTE — Patient Instructions (Signed)
 " ______________________________________________________________________    Post-Procedure Discharge Instructions  INSTRUCTIONS Apply ice:  Purpose: This will minimize any swelling and discomfort after procedure.  When: Day of procedure, as soon as you get home. How: Fill a plastic sandwich bag with crushed ice. Cover it with a small towel and apply to injection site. How long: (15 min on, 15 min off) Apply for 15 minutes then remove x 15 minutes.  Repeat sequence on day of procedure, until you go to bed. Apply heat:  Purpose: To treat any soreness and discomfort from the procedure. When: Starting the next day after the procedure. How: Apply heat to procedure site starting the day following the procedure. How long: May continue to repeat daily, until discomfort goes away. Food intake: Start with clear liquids (like water ) and advance to regular food, as tolerated.  Physical activities: Keep activities to a minimum for the first 8 hours after the procedure. After that, then as tolerated. Driving: If you have received any sedation, be responsible and do not drive. You are not allowed to drive for 24 hours after having sedation. Blood thinner: (Applies only to those taking blood thinners) You may restart your blood thinner 6 hours after your procedure. Insulin : (Applies only to Diabetic patients taking insulin ) As soon as you can eat, you may resume your normal dosing schedule. Infection prevention: Keep procedure site clean and dry. Shower daily and clean area with soap and water .  PAIN DIARY Post-procedure Pain Diary: Extremely important that this be done correctly and accurately. Recorded information will be used to determine the next step in treatment. For the purpose of accuracy, follow these rules: Evaluate only the area treated. Do not report or include pain from an untreated area. For the purpose of this evaluation, ignore all other areas of pain, except for the treated area. After your  procedure, avoid taking a long nap and attempting to complete the pain diary after you wake up. Instead, set your alarm clock to go off every hour, on the hour, for the initial 8 hours after the procedure. Document the duration of the numbing medicine, and the relief you are getting from it. Do not go to sleep and attempt to complete it later. It will not be accurate. If you received sedation, it is likely that you were given a medication that may cause amnesia. Because of this, completing the diary at a later time may cause the information to be inaccurate. This information is needed to plan your care. Follow-up appointment: Keep your post-procedure follow-up evaluation appointment after the procedure (usually 2 weeks for most procedures, 6 weeks for radiofrequencies). DO NOT FORGET to bring you pain diary with you.   EXPECT... (What should I expect to see with my procedure?) From numbing medicine (AKA: Local Anesthetics): Numbness or decrease in pain. You may also experience some weakness, which if present, could last for the duration of the local anesthetic. Onset: Full effect within 15 minutes of injected. Duration: It will depend on the type of local anesthetic used. On the average, 1 to 8 hours.  From steroids (Applies only if steroids were used): Decrease in swelling or inflammation. Once inflammation is improved, relief of the pain will follow. Onset of benefits: Depends on the amount of swelling present. The more swelling, the longer it will take for the benefits to be seen. In some cases, up to 10 days. Duration: Steroids will stay in the system x 2 weeks. Duration of benefits will depend on multiple posibilities including persistent  irritating factors. Side-effects: If present, they may typically last 2 weeks (the duration of the steroids). Frequent: Cramps (if they occur, drink Gatorade and take over-the-counter Magnesium  450-500 mg once to twice a day); water  retention with temporary weight  gain; increases in blood sugar; decreased immune system response; increased appetite. Occasional: Facial flushing (red, warm cheeks); mood swings; menstrual changes. Uncommon: Long-term decrease or suppression of natural hormones; bone thinning. (These are more common with higher doses or more frequent use. This is why we prefer that our patients avoid having any injection therapies in other practices.)  Very Rare: Severe mood changes; psychosis; aseptic necrosis. From procedure: Some discomfort is to be expected once the numbing medicine wears off. This should be minimal if ice and heat are applied as instructed.  CALL IF... (When should I call?) You experience numbness and weakness that gets worse with time, as opposed to wearing off. New onset bowel or bladder incontinence. (Applies only to procedures done in the spine)  Emergency Numbers: Durning business hours (Monday - Thursday, 8:00 AM - 4:00 PM) (Friday, 9:00 AM - 12:00 Noon): [phone] After hours: [phone] NOTE: If you are having a problem and are unable connect with, or to talk to a provider, then go to your nearest urgent care or emergency department. If the problem is serious and urgent, please call 911. ______________________________________________________________________     ______________________________________________________________________    Steroid injections  Common steroids for injections Triamcinolone : Used by many sports medicine physicians for large joint and bursal injections, often combined with a local anesthetic like lidocaine . A study focusing on coccydynia (tailbone pain) found triamcinolone  was more effective than betamethasone , suggesting it may also be preferable for other localized inflammation conditions. Methylprednisolone : A common alternative to triamcinolone  that is also a strong anti-inflammatory. It is available in different formulations, with the acetate suspension being the long-acting  option for intra-articular injections. Dexamethasone : This is a non-particulate steroid, meaning it has a lower risk of tissue damage compared to particulate steroids like triamcinolone  and methylprednisolone . While less common for this specific use, it is an option for targeted injections.   Considerations for physicians Particulate vs. non-particulate steroids: Triamcinolone  and methylprednisolone  are particulate, meaning they can clump together. Dexamethasone  is non-particulate. Particulate steroids are often preferred for their longer-lasting effects but carry a theoretical higher risk for certain injections (though this is less of a concern in the costochondral joints). Combined injectate: Corticosteroids are typically mixed with a local anesthetic like lidocaine  to provide both immediate pain relief (from the anesthetic) and longer-term inflammation reduction (from the steroid). Imaging guidance: To ensure accurate placement of the needle and medication, physicians may use ultrasound or fluoroscopic guidance for the injection, especially in complex or refractory cases.   Patient guidance Before undergoing a steroid injection, discuss the options with your physician. They will determine the best steroid, dosage, and procedure for your specific case based on factors like: Severity of your condition History of response to other treatments Your overall health status Experience and preference of the physician  Last  Updated: 05/08/2024 ______________________________________________________________________     ______________________________________________________________________    Patient information on: Body mass index (BMI) and Weight Management  Dear Karen Lewis you are receiving this information because your weight may be adversely affecting your health.   Your current Estimated body mass index is 58.99 kg/m as calculated from the following:   Height as of 07/16/24: 5' 3 (1.6 m).    Weight as of 07/16/24: 333 lb (151 kg).  We recommend you talk to your primary care physician about providing or referring you to a supervised weight management program.  Here is some information about weight and the body mass index (BMI) classification:  BMI is a measure of obesity that's calculated by dividing a person's weight in kilograms by their height in meters squared. A person can use an online calculator to determine their BMI. Body mass index (BMI) is a common tool for deciding whether a person has an appropriate body weight.  It measures a person's weight in relation to their height.  According to the [facility] of health (NIH): A BMI of less than 18.5 means that a person is underweight. A BMI of between 18.5 and 24.9 is ideal. A BMI of between 25 and 29.9 is overweight. A BMI over 30 indicates obesity.  Body Mass Index (BMI) Classification BMI level (kg/m2) Category Associated incidence of chronic pain  <18  Underweight   18.5-24.9 Ideal body weight   25-29.9 Overweight  20%  30-34.9 Obese (Class I)  68%  35-39.9 Severe obesity (Class II)  136%  >40 Extreme obesity (Class III)  254%    Morbidly Obese Classification: You will be considered to be Morbidly Obese if your BMI is above 30 and you have one or more of the following conditions caused or associated to obesity: 1.    Type 2 Diabetes (Leading to cardiovascular diseases (CVD), stroke, peripheral vascular diseases (PVD), retinopathy, nephropathy, and neuropathy) 2.    Cardiovascular Disease (High Blood Pressure; Congestive Heart Failure; High Cholesterol; Coronary Artery Disease; Angina; Arrhythmias, Dysrhythmias, or Heart Attacks) 3.    Breathing problems (Asthma; obesity-hypoventilation syndrome; obstructive sleep apnea; chronic inflammatory airway disease; reactive airway disease; or shortness of breath) 4.    Chronic kidney disease 5.    Liver disease (nonalcoholic fatty liver disease) 6.    High blood  pressure 7.    Acid reflux (gastroesophageal reflux disease; heartburn) 8.    Osteoarthritis (OA) (affecting the hip(s), the knee(s) and/or the lower back) (usually requiring knee and/or hip replacements, as well as back surgeries) 9.    Low back pain (Lumbar Facet Syndrome; and/or Degenerative Disc Disease) 10.  Hip pain (Osteoarthritis of hip) (For every 1 lbs of added body weight, there is a 2 lbs increase in pressure inside of each hip articulation. 1:2 mechanical relationship) 11.  Knee pain (Osteoarthritis of knee) (For every 1 lbs of added body weight, there is a 4 lbs increase in pressure inside of each knee articulation. 1:4 mechanical relationship) (patients with a BMI>30 kg/m2 were 6.8 times more likely to develop knee OA than normal-weight individuals) 12.  Cancer: Epidemiological studies have shown that obesity is a risk factor for: post-menopausal breast cancer; cancers of the endometrium, colon and kidney cancer; malignant adenomas of the esophagus. Obese subjects have an approximately 1.5-3.5-fold increased risk of developing these cancers compared with normal-weight subjects, and it has been estimated that between 15 and 45% of these cancers can be attributed to overweight. More recent studies suggest that obesity may also increase the risk of other types of cancer, including pancreatic, hepatic and gallbladder cancer. (Ref: Obesity and cancer. Pischon T, Nthlings U, [employer] H. Proc Nutr Soc. 2008 May;67(2):128-45. doi: 10.1017/S0029665108006976.) The International Agency for Research on Cancer (IARC) has identified 13 cancers associated with overweight and obesity: meningioma, multiple myeloma, adenocarcinoma of the esophagus, and cancers of the thyroid , postmenopausal breast cancer, gallbladder, stomach, liver, pancreas, kidney, ovaries, uterus, colon and rectal (colorectal) cancers. 55 percent  of all cancers diagnosed in women and 24 percent of those diagnosed in men are associated with  overweight and obesity.  Recommendation: If you have any of the above conditions it is urgent that you take a step back and concentrate in losing weight. Dedicate 100% of your efforts on this task. Nothing else will improve your health more than bringing your weight down and your BMI to less than 30.   Nutritionist and/or supervised weight-management program: We are aware that most chronic pain patients are unable to exercise secondary to their pain. For this reason, you must rely on proper nutrition and diet in order to lose the weight. We recommend you talk to a nutritionist.   Bariatric surgery: A person might be considered a candidate for bariatric surgery if they meet one of the following BMI criteria:  BMI of 40 or higher: This is considered extreme obesity (Class III). BMI of 35-39.9: This is considered obesity, and the person might also have a serious weight-related health condition, such as high blood pressure, type 2 diabetes, or severe sleep apnea  BMI of 30-34.9: This might be considered if the person has serious weight-related health problems and hasn't had substantial weight loss or improvement in co-morbidities through other methods   On your own: A realistic goal is to lose 10% of your body weight over a period of 12 months.  If over a period of six (6) months you have unsuccessfully tried to lose weight, then it is time for you to seek professional help and to enter a medically supervised weight management program, and/or undergo bariatric surgery.   Pain management considerations and possible limitations:  1.    Pharmacological Problems: Be advised that the use of opioid analgesics (oxycodone ; hydrocodone ; morphine ; methadone ; codeine; and all of their derivatives) have been associated with decreased metabolism and weight gain.  For this reason, should we see that you are unable to lose weight while taking these medications, it may become necessary for us  to taper down and  indefinitely discontinue them.  2.    Technical Problems: The incidence of successful interventional therapies decreases as the patient's BMI increases. It is much more difficult to accomplish a safe and effective interventional therapy on a patient with a BMI above 35. 3.    Radiation Exposure Problems: The x-rays machine, used to accomplish injection therapies, will automatically increase their x-ray output in order to capture an appropriate bone image. This means that radiation exposure increases exponentially with the patient's BMI. (The higher the BMI, the higher the radiation exposure.) Although the level of radiation used at a given time is still safe to the patient, it is not for the physician and/or assisting staff. Unfortunately, radiation exposure is accumulative. Because physicians and the staff have to do procedures and be exposed on a daily basis, this can result in health problems such as cancer and radiation burns. Radiation exposure to the staff is monitored by the radiation batches that they wear. The exposure levels are reported back to the staff on a quarterly basis. Depending on levels of exposure, physicians and staff may be obligated by law to decrease this exposure. This means that they have the right and obligation to refuse providing therapies where they may be overexposed to radiation. For this reason, physicians may decline to offer therapies such as radiofrequency ablation or implants to patients with a BMI above 40. 4.    Current Trends: Be advised that the current trend is to no longer offer certain  therapies to patients with a BMI equal to, or above 35, due to increase perioperative risks, increased technical procedural difficulties, and excessive radiation exposure to healthcare personnel.  Last updated: 06/06/2023 ______________________________________________________________________     "

## 2024-09-24 ENCOUNTER — Ambulatory Visit: Admitting: Urology

## 2024-09-25 NOTE — Progress Notes (Unsigned)
 PROVIDER NOTE: Interpretation of information contained herein should be left to medically-trained personnel. Specific patient instructions are provided elsewhere under Patient Instructions section of medical record. This document was created in part using AI and STT-dictation technology, any transcriptional errors that may result from this process are unintentional.  Patient: Karen Lewis  Service: E/M   PCP: [name], MD  DOB: 04-Mar-1971  DOS: 09/26/2024  Provider: Emmy MARLA Blanch, NP  MRN: [id number]  Delivery: Face-to-face  Specialty: Interventional Pain Management  Type: Established Patient  Setting: Ambulatory outpatient facility  Specialty designation: 09  Referring Prov.: [name], MD  Location: Outpatient office facility       History of present illness (HPI) [name], a 54 y.o. year old female, is here today because of her No primary diagnosis found.. Karen Lewis primary complain today is No chief complaint on file.  Pertinent problems: Karen Lewis has History of asthma; Fibromyalgia; Type 2 diabetes mellitus without complication, without long-term current use of insulin  (HCC); Long term current use of opiate analgesic; Long term prescription opiate use; Opiate use; Opiate dependence (HCC); Chronic knee pain (1ry area of Pain) (Bilateral) (R>L); Chronic low back pain (3ry area of Pain) (Bilateral) (R>L) w/o sciatica; Lumbar facet syndrome (Bilateral) (R>L); Secondary osteoarthritis of multiple sites; Grade 1  Anterolisthesis of L3/L4 & L4/L5 (1.4 cm); Chronic hip pain s/p THR (2ry area of pain) (Bilateral); Class 3 severe obesity due to excess calories with serious comorbidity and body mass index (BMI) of 60.0 to 69.9 in adult [facility]); Lumbar spondylosis; Chronic pain syndrome; Neurogenic pain; Chronic shoulder pain (Left); Osteoarthritis of shoulder (Left); and Osteoarthritis of hip (Right) on their pertinent problem list.  Pain Assessment: Severity of   is reported as a  /10.  Location:    / . Onset:  . Quality:  . Timing:  . Modifying factor(s):  SABRA Vitals:  vitals were not taken for this visit.  BMI: Estimated body mass index is 58.99 kg/m as calculated from the following:   Height as of 07/16/24: 5' 3 (1.6 m).   Weight as of 07/16/24: 333 lb (151 kg).  Last encounter: 06/12/2024. Last procedure: Visit date not found.  Reason for encounter:  *** .   Discussed the use of AI scribe software for clinical note transcription with the patient, who gave verbal consent to proceed.  History of Present Illness           Pharmacotherapy Assessment   Oxycodone  (Oxy IR/oxycodone ) 5 mg immediate release every 8 hours as needed for pain. MME=22.50 Monitoring: [place] PMP: PDMP not reviewed this encounter.       Pharmacotherapy: No side-effects or adverse reactions reported. Compliance: No problems identified. Effectiveness: Clinically acceptable.  No notes on file  UDS:  Summary  Date Value Ref Range Status  04/19/2024 FINAL  Final    Comment:    ==================================================================== [employer] Select 13 (MW) ==================================================================== Test                             Result       Flag       Units  Drug Absent but Declared for Prescription Verification   Clonazepam                      Not Detected UNEXPECTED ng/mg creat   Oxycodone   Not Detected UNEXPECTED ng/mg creat   Butalbital                      Not Detected UNEXPECTED ==================================================================== Test                      Result    Flag   Units      Ref Range   Creatinine              95               mg/dL      >=79 ==================================================================== Declared Medications:  The flagging and interpretation on this report are based on the  following declared medications.  Unexpected results may arise from  inaccuracies in the declared  medications.   **Note: The testing scope of this panel includes these medications:   Butalbital  (Fioricet )  Clonazepam  (Klonopin )  Oxycodone    **Note: The testing scope of this panel does not include the  following reported medications:   Acetaminophen  (Tylenol )  Acetaminophen  (Fioricet )  Albuterol  (Ventolin  HFA)  Amitriptyline  (Elavil )  Biotin   Caffeine  (Fioricet )  Calcium  (Tums)  Diphenhydramine  (Benadryl )  Duloxetine  (Cymbalta )  Dupilumab  Epinephrine  (EpiPen )  Estradiol  (Estrace )  Famotidine  (Pepcid )  Galcanezumab (Emgality)  Hydrochlorothiazide  Levocetirizine (Xyzal )  Levothyroxine  (Synthroid )  Lisinopril  (Zestril )  Magnesium  (Mag-Ox)  Meclizine  (Antivert )  Metformin   Metoprolol  (Lopressor )  Montelukast  (Singulair )  Naloxone  (Narcan )  Nitrofurantoin  (Macrobid )  Nystatin  (Mycostatin )  Omeprazole (Prilosec)  Phentermine (Adipex)  Pregabalin  (Lyrica )  Probiotic  Progesterone  (Prometrium )  Quetiapine  (Seroquel )  Rizatriptan (Maxalt)  Rosuvastatin  (Crestor )  Semaglutide (Ozempic)  Tizanidine  (Zanaflex )  Topical Diclofenac  (Voltaren )  Topiramate  (Topamax )  Vitamin B12  Vitamin D2 ==================================================================== For clinical consultation, please call [phone]. ====================================================================     No results found for: CBDTHCR No results found for: D8THCCBX No results found for: D9THCCBX  ROS  Constitutional: Denies any fever or chills Gastrointestinal: No reported hemesis, hematochezia, vomiting, or acute GI distress Musculoskeletal: Denies any acute onset joint swelling, redness, loss of ROM, or weakness Neurological: No reported episodes of acute onset apraxia, aphasia, dysarthria, agnosia, amnesia, paralysis, loss of coordination, or loss of consciousness  Medication Review  DULoxetine , Dupilumab, EPINEPHrine , Galcanezumab-gnlm, QUEtiapine , Semaglutide(0.25 or  0.5MG /DOS), Tab-A-Vite, acidophilus, albuterol , amitriptyline , butalbital -acetaminophen -caffeine , calcium  carbonate, cephALEXin , clonazePAM , cyanocobalamin , diclofenac  sodium, diphenhydrAMINE , ergocalciferol , estradiol , famotidine , fluticasone  furoate-vilanterol, hydrochlorothiazide, ketorolac , levocetirizine, levothyroxine , lisinopril , magnesium  oxide, meclizine , metFORMIN , metoprolol  tartrate, montelukast , naloxone , naphazoline-pheniramine, omeprazole, oxyCODONE , pregabalin , progesterone , rizatriptan, rosuvastatin , tiZANidine , topiramate , and vitamin k  History Review  Allergy: Karen Lewis is allergic to bactrim [sulfamethoxazole-trimethoprim], omalizumab, ciprofloxacin, shellfish allergy, and nsaids. Drug: Karen Lewis  reports no history of drug use. Alcohol :  reports no history of alcohol  use. Tobacco:  reports that she has never smoked. She has never used smokeless tobacco. Social: Karen Lewis  reports that she has never smoked. She has never used smokeless tobacco. She reports that she does not drink alcohol  and does not use drugs. Medical:  has a past medical history of Anemia, Anginal pain, Anxiety, Arthralgia of hip (07/29/2015), Arthritis, Arthritis, degenerative (07/29/2015), Asthma, Cephalalgia (07/25/2014), Dependence on unknown drug (HCC), Depression, Diabetes mellitus without complication (HCC), Difficult intubation, Dysrhythmia, Eczema, Fibromyalgia, Gastritis, GERD (gastroesophageal reflux disease), Gonalgia (07/29/2015), Gout, H/O cardiovascular disorder (03/10/2015), H/O surgical procedure (12/05/2012), H/O thyroid  disease (03/10/2015), Headache, Herpes, History of artificial joint (07/29/2015), History of hiatal hernia, Hypertension, Hypomagnesemia, Hypothyroidism, IDA (iron  deficiency anemia) (05/28/2019), LBP (low back pain) (07/29/2015), Neuromuscular disorder (HCC), Obesity, PCOS (polycystic ovarian  syndrome), Primary osteoarthritis of both knees (07/29/2015), Sleep apnea, Thyroid  nodule  (bilateral), and Umbilical hernia. Surgical: Karen Lewis  has a past surgical history that includes Laparoscopic partial gastrectomy; Shoulder arthroscopy (Right); Carpal tunnel release (Bilateral); Diagnostic laparoscopy; Cholecystectomy; Trigger finger release (Right); Thyroidectomy (N/A, 11/12/2015); left trigger finger; Roux-en-Y Gastric Bypass (06/03/2017); Hiatal hernia repair; peniculectomy (N/A, 07/05/2018); Total hip arthroplasty (Right, 11/27/2019); Joint replacement (Bilateral, hip); Appendectomy; Trigger finger release (Right, 04/24/2020); Colonoscopy with propofol  (N/A, 02/11/2021); Esophagogastroduodenoscopy (egd) with propofol  (N/A, 02/11/2021); Xi robotic laparoscopic assisted appendectomy (N/A, 05/12/2024); Laparoscopic lysis of adhesions (N/A, 05/12/2024); and Ventral hernia repair (N/A, 05/16/2024). Family: family history includes Alcohol  abuse in her father and mother; Anxiety disorder in her father and mother; Brain cancer (age of onset: 41) in her paternal uncle; Breast cancer (age of onset: 34) in her paternal aunt and paternal aunt; COPD in her father; Depression in her brother, father, and mother; Diabetes in her brother, father, and mother; Hypertension in her brother, father, and mother; Kidney cancer in her mother; Kidney failure in her father; Post-traumatic stress disorder in her father; Sleep apnea in her brother, father, and mother.  Laboratory Chemistry Profile   Renal Lab Results  Component Value Date   BUN 10 05/21/2024   CREATININE 0.83 05/21/2024   GFRAA >60 04/16/2020   GFRNONAA >60 05/21/2024    Hepatic Lab Results  Component Value Date   AST 17 05/13/2024   ALT 26 05/13/2024   ALBUMIN 3.0 (L) 05/16/2024   ALKPHOS 64 05/13/2024   LIPASE 40 05/12/2024    Electrolytes Lab Results  Component Value Date   NA 137 05/21/2024   K 3.9 05/21/2024   CL 107 05/21/2024   CALCIUM  7.6 (L) 05/21/2024   MG 2.2 05/21/2024   PHOS 4.1 05/19/2024    Bone Lab Results  Component  Value Date   VD25OH 27.4 (L) 11/24/2015   VD125OH2TOT 41.5 11/24/2015    Inflammation (CRP: Acute Phase) (ESR: Chronic Phase) Lab Results  Component Value Date   CRP 0.7 09/27/2019   ESRSEDRATE 30 (H) 09/27/2019   LATICACIDVEN 1.0 05/12/2024         Note: Above Lab results reviewed.  Recent Imaging Review  DG PAIN CLINIC C-ARM 1-60 MIN NO REPORT Fluoro was used, but no Radiologist interpretation will be provided.  Please refer to NOTES tab for provider progress note. Note: Reviewed        Physical Exam  Vitals: LMP  (LMP Unknown)  BMI: Estimated body mass index is 58.99 kg/m as calculated from the following:   Height as of 07/16/24: 5' 3 (1.6 m).   Weight as of 07/16/24: 333 lb (151 kg). Ideal: Patient weight not recorded General appearance: Well nourished, well developed, and well hydrated. In no apparent acute distress Mental status: Alert, oriented x 3 (person, place, & time)       Respiratory: No evidence of acute respiratory distress Eyes: PERLA   Assessment   Diagnosis Status  No diagnosis found. Controlled Controlled Controlled   Updated Problems: No problems updated.  Plan of Care  Problem-specific:  Assessment and Plan            Karen Lewis has a current medication list which includes the following long-term medication(s): albuterol , albuterol , diphenhydramine , estradiol , hydrochlorothiazide, magnesium  oxide, metoprolol  tartrate, montelukast , naloxone , oxycodone , progesterone , and rizatriptan.  Pharmacotherapy (Medications Ordered): No orders of the defined types were placed in this encounter.  Orders:  No orders of the defined types were placed in this  encounter.    {There is no content from the last Plan section.}   No follow-ups on file.    Recent Visits Date Type Provider Dept  07/16/24 Office Visit Tanya Glisson, MD [facility]  Showing recent visits within past 90 days and meeting all other requirements Future  Appointments Date Type Provider Dept  09/26/24 Appointment Royetta Probus K, NP [facility]  10/02/24 Appointment Tanya Glisson, MD [facility]  Showing future appointments within next 90 days and meeting all other requirements  I discussed the assessment and treatment plan with the patient. The patient was provided an opportunity to ask questions and all were answered. The patient agreed with the plan and demonstrated an understanding of the instructions.  Patient advised to call back or seek an in-person evaluation if the symptoms or condition worsens.  Duration of encounter: *** minutes.  Total time on encounter, as per AMA guidelines included both the face-to-face and non-face-to-face time personally spent by the physician and/or other qualified health care professional(s) on the day of the encounter (includes time in activities that require the physician or other qualified health care professional and does not include time in activities normally performed by clinical staff). Physician's time may include the following activities when performed: Preparing to see the patient (e.g., pre-charting review of records, searching for previously ordered imaging, lab work, and nerve conduction tests) Review of prior analgesic pharmacotherapies. Reviewing PMP Interpreting ordered tests (e.g., lab work, imaging, nerve conduction tests) Performing post-procedure evaluations, including interpretation of diagnostic procedures Obtaining and/or reviewing separately obtained history Performing a medically appropriate examination and/or evaluation Counseling and educating the patient/family/caregiver Ordering medications, tests, or procedures Referring and communicating with other health care professionals (when not separately reported) Documenting clinical information in the electronic or other health record Independently interpreting results (not separately reported) and communicating  results to the patient/ family/caregiver Care coordination (not separately reported)  Note by: Daivion Pape K Cane Dubray, NP (TTS and AI technology used. I apologize for any typographical errors that were not detected and corrected.) Date: 09/26/2024; Time: 3:12 PM

## 2024-09-26 ENCOUNTER — Encounter: Payer: Self-pay | Admitting: Nurse Practitioner

## 2024-09-26 ENCOUNTER — Telehealth: Payer: Self-pay | Admitting: Psychiatry

## 2024-09-26 ENCOUNTER — Ambulatory Visit: Attending: Nurse Practitioner | Admitting: Nurse Practitioner

## 2024-09-26 VITALS — BP 127/84 | HR 114 | Temp 98.1°F | Ht 63.0 in | Wt 333.0 lb

## 2024-09-26 DIAGNOSIS — Z79899 Other long term (current) drug therapy: Secondary | ICD-10-CM | POA: Diagnosis present

## 2024-09-26 DIAGNOSIS — M545 Low back pain, unspecified: Secondary | ICD-10-CM | POA: Diagnosis present

## 2024-09-26 DIAGNOSIS — G894 Chronic pain syndrome: Secondary | ICD-10-CM | POA: Diagnosis present

## 2024-09-26 DIAGNOSIS — G8929 Other chronic pain: Secondary | ICD-10-CM | POA: Diagnosis present

## 2024-09-26 DIAGNOSIS — Z79891 Long term (current) use of opiate analgesic: Secondary | ICD-10-CM | POA: Diagnosis present

## 2024-09-26 DIAGNOSIS — M19011 Primary osteoarthritis, right shoulder: Secondary | ICD-10-CM | POA: Insufficient documentation

## 2024-09-26 DIAGNOSIS — M25561 Pain in right knee: Secondary | ICD-10-CM | POA: Insufficient documentation

## 2024-09-26 DIAGNOSIS — M25562 Pain in left knee: Secondary | ICD-10-CM | POA: Diagnosis present

## 2024-09-26 DIAGNOSIS — M19012 Primary osteoarthritis, left shoulder: Secondary | ICD-10-CM | POA: Insufficient documentation

## 2024-09-26 MED ORDER — OXYCODONE HCL 5 MG PO TABS: 5.0000 mg | ORAL_TABLET | Freq: Three times a day (TID) | ORAL | 0 refills | Status: AC | PRN

## 2024-09-26 NOTE — Progress Notes (Signed)
 ENCOUNTER: Patient Class :No patient class for patient encounter Department: [facility]IC [address] [place] [address]  PATIENT: Patient Demographics      Name Patient ID SSN Gender Identity Birth Date   Karen Lewis, Karen Lewis [id number] Female 2071-06-13 (53 yrs)          Address Phone Email       [address] [place] [address] [phone] [email or web address]) [email or web address] Race         [place], [place] or African American             Reg Status PCP Date Last Verified Next Review Date     Verified [name] [phone] 09/10/24 10/10/24           Marital Status Religion Language       Married Non-Denominational English              EMERGENCY CONTACT: Name Relationship Lgl Grd Work Administrator, Sports  1. GLADIS RIIS Spouse    [phone]  2. Broadus,JOHN DAVID Brother or [phone]    GUARANTOR: There is no guarantor information entered for this encounter.  COVERAGE: Primary Visit Coverage      Payer Plan Group Number Group Name Payer Phone Plan Phone   No coverage found                Secondary Visit Coverage      Payer Plan Group Number Group Name Payer Phone Plan Phone   No coverage found                Primary Coverage      Payer Plan Group Number Group Name Payer Phone Plan Phone   [facility] MEDICARE ADVANTAGE PLAN [facility] DUAL SNP PPO [phone]              Primary Subscriber      Subscriber ID Subscriber Name Subscriber [facility] Subscriber Address   [id number] [facility] A [id number]      [place], [place] [address]           Secondary Coverage      Payer Plan Group Number Group Name Payer Phone Plan Phone   No coverage found

## 2024-09-26 NOTE — Progress Notes (Signed)
 [facility] Rising [facility]  Care Management -  Follow-Up Engagement  Purpose: A [facility] completed a phone call to address follow-up care needs.  Karen Lewis is being managed for the following: community resources.  The patient states that she is doing okay. Mental health doing fine. We discussed the option of obtaining a psychological assessment at [facility]. Karen Lewis Is interested and would like to proceed with steps of obtaining a referral and scheduling. Since a referral was previously requested CM left a voicemail message with the new patient/referral line inquiring if it had been received. Karen Lewis additionally noted interest in transferring her medication management care to the provider she saw at her local [facility] office. CM left a voicemail inquiring if the current referral would be suitable for this ongoing care. We discussed potential for case graduation at next follow-up once psychological evaluation is scheduled. Patient is agreeable.  Assessment and Intervention of Needs: [facility] Assessment completed.   Plan: Care Plan Discussed - Yes - Karen Lewis has agreed to participate with the identified care plan that was discussed on 09/26/2024. Please see Plan of Care in the Notes section of Chart Review  Next follow up appointment date: 10/17/2024 1:00 PM  [facility]    [address]; [place], [place] [address] l  [email or web address] l [phone]   For more information on [facility] services, click here.

## 2024-09-26 NOTE — Patient Instructions (Signed)

## 2024-09-26 NOTE — Progress Notes (Signed)
 Nursing Pain Medication Assessment:  Safety precautions to be maintained throughout the outpatient stay will include: orient to surroundings, keep bed in low position, maintain call bell within reach at all times, provide assistance with transfer out of bed and ambulation.  Medication Inspection Compliance: Pill count conducted under aseptic conditions, in front of the patient. Neither the pills nor the bottle was removed from the patient's sight at any time. Once count was completed pills were immediately returned to the patient in their original bottle.  Medication: Oxycodone  IR Pill/Patch Count: 40 of 90 pills/patches remain Pill/Patch Appearance: Markings consistent with prescribed medication Bottle Appearance: Standard pharmacy container. Clearly labeled. Filled Date: 57 / 20 / 2025 Last Medication intake:  YesterdaySafety precautions to be maintained throughout the outpatient stay will include: orient to surroundings, keep bed in low position, maintain call bell within reach at all times, provide assistance with transfer out of bed and ambulation.

## 2024-09-26 NOTE — Telephone Encounter (Signed)
 Yes. Please schedule for in person follow up.

## 2024-10-02 ENCOUNTER — Ambulatory Visit: Admitting: Pain Medicine

## 2024-10-16 ENCOUNTER — Encounter: Payer: Self-pay | Admitting: Psychiatry

## 2024-10-16 ENCOUNTER — Telehealth: Admitting: Psychiatry

## 2024-10-16 DIAGNOSIS — F50819 Binge eating disorder, unspecified: Secondary | ICD-10-CM | POA: Diagnosis not present

## 2024-10-16 DIAGNOSIS — Z9189 Other specified personal risk factors, not elsewhere classified: Secondary | ICD-10-CM | POA: Diagnosis not present

## 2024-10-16 DIAGNOSIS — F331 Major depressive disorder, recurrent, moderate: Secondary | ICD-10-CM | POA: Diagnosis not present

## 2024-10-16 DIAGNOSIS — F4310 Post-traumatic stress disorder, unspecified: Secondary | ICD-10-CM

## 2024-10-16 DIAGNOSIS — G4700 Insomnia, unspecified: Secondary | ICD-10-CM

## 2024-10-16 MED ORDER — ARIPIPRAZOLE 2 MG PO TABS: 2.0000 mg | ORAL_TABLET | Freq: Every day | ORAL | 1 refills | Status: AC

## 2024-11-14 ENCOUNTER — Ambulatory Visit (INDEPENDENT_AMBULATORY_CARE_PROVIDER_SITE_OTHER): Admitting: Nurse Practitioner

## 2024-12-03 ENCOUNTER — Ambulatory Visit: Admitting: Urology

## 2024-12-20 ENCOUNTER — Ambulatory Visit: Admitting: Psychiatry

## 2024-12-25 ENCOUNTER — Inpatient Hospital Stay

## 2024-12-25 ENCOUNTER — Inpatient Hospital Stay: Admitting: Oncology

## 2024-12-25 ENCOUNTER — Encounter: Admitting: Nurse Practitioner
